# Patient Record
Sex: Female | Born: 1955 | Race: Black or African American | Hispanic: No | Marital: Single | Smoking: Former smoker
Health system: Southern US, Community
[De-identification: ages and names within clinical notes are randomized; demographics above are authoritative.]

## PROBLEM LIST (undated history)

## (undated) DIAGNOSIS — I517 Cardiomegaly: Secondary | ICD-10-CM

## (undated) DIAGNOSIS — M1990 Unspecified osteoarthritis, unspecified site: Secondary | ICD-10-CM

## (undated) DIAGNOSIS — G4730 Sleep apnea, unspecified: Secondary | ICD-10-CM

## (undated) DIAGNOSIS — I890 Lymphedema, not elsewhere classified: Secondary | ICD-10-CM

## (undated) DIAGNOSIS — M4800 Spinal stenosis, site unspecified: Secondary | ICD-10-CM

## (undated) DIAGNOSIS — I10 Essential (primary) hypertension: Secondary | ICD-10-CM

## (undated) DIAGNOSIS — J45909 Unspecified asthma, uncomplicated: Secondary | ICD-10-CM

## (undated) DIAGNOSIS — I509 Heart failure, unspecified: Secondary | ICD-10-CM

## (undated) HISTORY — DX: Heart failure, unspecified: I50.9

## (undated) HISTORY — PX: NO PAST SURGERIES: SHX2092

---

## 2005-01-17 ENCOUNTER — Emergency Department: Payer: Self-pay | Admitting: Emergency Medicine

## 2005-01-23 ENCOUNTER — Emergency Department: Payer: Self-pay | Admitting: General Practice

## 2005-03-23 ENCOUNTER — Emergency Department: Payer: Self-pay | Admitting: Emergency Medicine

## 2005-04-30 ENCOUNTER — Ambulatory Visit: Payer: Self-pay | Admitting: Family Medicine

## 2008-03-01 ENCOUNTER — Emergency Department: Payer: Self-pay | Admitting: Emergency Medicine

## 2009-02-10 DIAGNOSIS — I890 Lymphedema, not elsewhere classified: Secondary | ICD-10-CM

## 2009-02-10 DIAGNOSIS — I10 Essential (primary) hypertension: Secondary | ICD-10-CM | POA: Insufficient documentation

## 2009-05-25 ENCOUNTER — Ambulatory Visit: Payer: Self-pay

## 2009-06-29 ENCOUNTER — Ambulatory Visit: Payer: Self-pay | Admitting: Specialist

## 2009-06-29 ENCOUNTER — Other Ambulatory Visit: Payer: Self-pay | Admitting: Internal Medicine

## 2009-07-21 ENCOUNTER — Ambulatory Visit: Payer: Self-pay | Admitting: Internal Medicine

## 2009-08-05 ENCOUNTER — Encounter: Payer: Self-pay | Admitting: Internal Medicine

## 2009-08-09 ENCOUNTER — Encounter: Payer: Self-pay | Admitting: Internal Medicine

## 2009-09-06 ENCOUNTER — Encounter: Payer: Self-pay | Admitting: Internal Medicine

## 2009-10-07 ENCOUNTER — Encounter: Payer: Self-pay | Admitting: Internal Medicine

## 2009-11-06 ENCOUNTER — Encounter: Payer: Self-pay | Admitting: Internal Medicine

## 2009-11-14 ENCOUNTER — Ambulatory Visit: Payer: Self-pay

## 2009-12-07 ENCOUNTER — Encounter: Payer: Self-pay | Admitting: Internal Medicine

## 2010-01-06 ENCOUNTER — Encounter: Payer: Self-pay | Admitting: Internal Medicine

## 2010-03-29 ENCOUNTER — Emergency Department: Payer: Self-pay | Admitting: Unknown Physician Specialty

## 2011-03-30 ENCOUNTER — Ambulatory Visit: Payer: Self-pay

## 2011-04-11 ENCOUNTER — Ambulatory Visit: Payer: Self-pay | Admitting: Nurse Practitioner

## 2011-07-11 DIAGNOSIS — M4800 Spinal stenosis, site unspecified: Secondary | ICD-10-CM | POA: Insufficient documentation

## 2012-07-08 ENCOUNTER — Ambulatory Visit: Payer: Self-pay | Admitting: Nurse Practitioner

## 2013-05-18 ENCOUNTER — Emergency Department: Payer: Self-pay | Admitting: Emergency Medicine

## 2013-05-18 LAB — URINALYSIS, COMPLETE
Bacteria: NONE SEEN
Bilirubin,UR: NEGATIVE
Blood: NEGATIVE
Glucose,UR: NEGATIVE mg/dL (ref 0–75)
Ketone: NEGATIVE
Nitrite: NEGATIVE
Protein: NEGATIVE
RBC,UR: <1 /HPF (ref 0–5)
Specific Gravity: 1.011 (ref 1.003–1.030)
WBC UR: 1 /HPF (ref 0–5)

## 2013-05-18 LAB — CBC
HGB: 12.7 g/dL (ref 12.0–16.0)
Platelet: 180 10*3/uL (ref 150–440)
RBC: 5.06 10*6/uL (ref 3.80–5.20)
RDW: 15.5 % — ABNORMAL HIGH (ref 11.5–14.5)
WBC: 11.1 10*3/uL — ABNORMAL HIGH (ref 3.6–11.0)

## 2013-05-18 LAB — COMPREHENSIVE METABOLIC PANEL
BUN: 13 mg/dL (ref 7–18)
Bilirubin,Total: 0.5 mg/dL (ref 0.2–1.0)
Chloride: 102 mmol/L (ref 98–107)
Co2: 30 mmol/L (ref 21–32)
Creatinine: 1 mg/dL (ref 0.60–1.30)
EGFR (African American): >60
EGFR (Non-African Amer.): >60
Glucose: 88 mg/dL (ref 65–99)
SGOT(AST): 24 U/L (ref 15–37)

## 2013-09-28 ENCOUNTER — Inpatient Hospital Stay: Payer: Self-pay | Admitting: Internal Medicine

## 2013-09-28 LAB — CBC WITH DIFFERENTIAL/PLATELET
Basophil #: 0.1 10*3/uL (ref 0.0–0.1)
Basophil %: 0.8 %
EOS ABS: 0 10*3/uL (ref 0.0–0.7)
EOS PCT: 0.1 %
HCT: 39.8 % (ref 35.0–47.0)
HGB: 12.8 g/dL (ref 12.0–16.0)
LYMPHS PCT: 25.3 %
Lymphocyte #: 2.1 10*3/uL (ref 1.0–3.6)
MCH: 25.8 pg — ABNORMAL LOW (ref 26.0–34.0)
MCHC: 32.1 g/dL (ref 32.0–36.0)
MCV: 81 fL (ref 80–100)
Monocyte #: 0.8 x10 3/mm (ref 0.2–0.9)
Monocyte %: 9.9 %
Neutrophil #: 5.3 10*3/uL (ref 1.4–6.5)
Neutrophil %: 63.9 %
Platelet: 222 10*3/uL (ref 150–440)
RBC: 4.94 10*6/uL (ref 3.80–5.20)
RDW: 15.5 % — ABNORMAL HIGH (ref 11.5–14.5)
WBC: 8.3 10*3/uL (ref 3.6–11.0)

## 2013-09-28 LAB — BASIC METABOLIC PANEL
Anion Gap: 6 — ABNORMAL LOW (ref 7–16)
BUN: 12 mg/dL (ref 7–18)
CREATININE: 0.95 mg/dL (ref 0.60–1.30)
Calcium, Total: 8.6 mg/dL (ref 8.5–10.1)
Chloride: 104 mmol/L (ref 98–107)
Co2: 29 mmol/L (ref 21–32)
EGFR (African American): >60
Glucose: 74 mg/dL (ref 65–99)
Osmolality: 276 (ref 275–301)
Potassium: 4 mmol/L (ref 3.5–5.1)
SODIUM: 139 mmol/L (ref 136–145)

## 2013-09-29 LAB — BASIC METABOLIC PANEL
Anion Gap: 5 — ABNORMAL LOW (ref 7–16)
BUN: 13 mg/dL (ref 7–18)
CALCIUM: 8.6 mg/dL (ref 8.5–10.1)
CHLORIDE: 101 mmol/L (ref 98–107)
Co2: 31 mmol/L (ref 21–32)
Creatinine: 0.92 mg/dL (ref 0.60–1.30)
EGFR (African American): >60
EGFR (Non-African Amer.): >60
GLUCOSE: 99 mg/dL (ref 65–99)
Osmolality: 274 (ref 275–301)
Potassium: 4.1 mmol/L (ref 3.5–5.1)
SODIUM: 137 mmol/L (ref 136–145)

## 2013-09-30 LAB — BASIC METABOLIC PANEL
Anion Gap: 6 — ABNORMAL LOW (ref 7–16)
BUN: 14 mg/dL (ref 7–18)
Calcium, Total: 8.4 mg/dL — ABNORMAL LOW (ref 8.5–10.1)
Chloride: 102 mmol/L (ref 98–107)
Co2: 28 mmol/L (ref 21–32)
Creatinine: 0.95 mg/dL (ref 0.60–1.30)
EGFR (African American): >60
EGFR (Non-African Amer.): >60
GLUCOSE: 95 mg/dL (ref 65–99)
Osmolality: 272 (ref 275–301)
Potassium: 4.1 mmol/L (ref 3.5–5.1)
Sodium: 136 mmol/L (ref 136–145)

## 2013-10-05 LAB — WOUND CULTURE

## 2013-10-08 ENCOUNTER — Encounter: Payer: Self-pay | Admitting: Surgery

## 2013-11-06 ENCOUNTER — Encounter: Payer: Self-pay | Admitting: Surgery

## 2013-12-28 DIAGNOSIS — Z720 Tobacco use: Secondary | ICD-10-CM | POA: Insufficient documentation

## 2013-12-28 DIAGNOSIS — R21 Rash and other nonspecific skin eruption: Secondary | ICD-10-CM | POA: Insufficient documentation

## 2014-05-07 ENCOUNTER — Ambulatory Visit: Payer: Self-pay | Admitting: Family Medicine

## 2014-10-30 NOTE — Discharge Summary (Signed)
PATIENT NAME:  [name], [name] MR#:  [id number] DATE OF BIRTH:  [birth year]  DATE OF ADMISSION:  09/28/2013 DATE OF DISCHARGE:  10/01/2013  ADMITTING PHYSICIAN: Dr. Benjie Karvonen. DISCHARGING PHYSICIAN:  Gladstone Lighter.  PRIMARY CARE PHYSICIAN:  At [facility] clinic.   [place]: None.   DISCHARGE DIAGNOSES: 1.  Left lower extremity cellulitis.  2.  Hypertension.  3.  Chronic bilateral lymphedema.  4.  Obstructive sleep apnea.  5.  Asthma.  6.  Spinal stenosis.  7.  Chronic low back pain.   DISCHARGE HOME MEDICATIONS:  1.  Lasix 40 mg p.o. daily p.r.n. for swelling.  2.  Gabapentin 300 mg once a day as needed for pain.  3.  Atenolol 50 mg p.o. daily.  4.  Tylenol Arthritis Caplet 1 to 2 tablets 650 mg strength every 8 hours as needed for pain.  5.  Cetirizine 10 mg p.o. daily p.r.n. for allergies.  6.  Proventil inhaler 2 puffs 4 times a day as needed.  7.  Clindamycin 300 mg every 6 hours for 8 days.  8.  Tramadol 50 mg p.o. for pain.   DISCHARGE DIET: Low sodium ADA 1800 calorie diet.   DISCHARGE ACTIVITY: As tolerated.     FOLLOWUP INSTRUCTIONS: 1.  Follow up in the wound clinic in 5 days.  2.  PCP followup in 1 week.  3.  Please advise to eat more yogurt and take probiotic capsules over the counter while on the antibiotics to prevent diarrhea.   LABORATORY DATA AND IMAGING STUDIES: Prior to discharge, wound cultures were growing heavy growth of Staph aureus and gram-negative rods. WBC is 8.3, hemoglobin 12.8, hematocrit 39.8, platelet count 222.   Sodium 136, potassium 4.1, chloride 102, bicarb 28, BUN 14, creatinine 0.96, glucose 99, calcium 8.4. Ultrasound Dopplers bilateral lower extremities showing no evidence of DVT, but the patient's lymphedema is hard to evaluate.   BRIEF HOSPITAL COURSE: Ms. Heffelfinger is a pleasant 59 year old female with past medical history significant for asthma, sleep apnea, spinal stenosis, chronic low back pain and bilateral  lymphedema who presented to the hospital secondary to worsening redness, tenderness and swelling of her left lower extremity. She has multiple pustules that were coalescing to form large areas of pus,  worsened erythema and tenderness and swelling of her left leg.  1.  Cellulitis, started on vanc and Zosyn. Blood culture was not ordered. The patient has been afebrile during her hospital course. She was seen by wound care nurse, along with the antibiotics. Local treatment for her wound also helped with dressing changes. She was advised to follow up with wound care clinic after discharge as well and is being discharged on clindamycin. The patient was also advised to take probiotics to avoid C. diff diarrhea while on clindamycin.   Her swelling and redness have improved. Her ambulation has improved as well prior to discharge. Her course has been otherwise uneventful in the hospital. All her other home medications were continued without any changes.   DISCHARGE CONDITION: Stable.   DISCHARGE DISPOSITION: Home.   TIME SPENT ON DISCHARGE: 40 minutes.    ____________________________ Gladstone Lighter, MD rk:dmm D: 10/01/2013 15:08:00 ET T: 10/01/2013 17:52:58 ET JOB#: [id number]  cc: Gladstone Lighter, MD, <Dictator> [facility] Gladstone Lighter MD ELECTRONICALLY SIGNED 10/15/2013 13:50

## 2014-10-30 NOTE — H&P (Signed)
PATIENT NAME:  [name], RAYLE MR#:  [id number] DATE OF BIRTH:  20-Oct-1955  DATE OF ADMISSION:  09/28/2013  PRIMARY CARE PHYSICIAN: At [facility].  CHIEF COMPLAINT: Left leg pain and lymphedema.  HISTORY OF PRESENT ILLNESS: The patient is a very pleasant 59 year old female with morbid obesity, hypertension, OSA, asthma, spinal stenosis, who presents with the above complaint. Over the past several weeks, the patient had increasing lower extremity edema and pain. She saw her PCP about 10 days ago. The plan was for the patient to have wound consultation; however, she has been unable to see a wound care clinician. She comes in today with increasing left lower extremity pain to the point she is unable to walk. It also is very warm and tender. In the ER, she was started on clindamycin. Dopplers of the lower extremity were performed with limited visibility but no obvious deep vein thrombosis.   REVIEW OF SYSTEMS:  CONSTITUTIONAL: No fever. Positive fatigue and weakness. EYES: No blurry or double vision. No glaucoma or cataracts. ENT: No tinnitus, ear pain, hearing loss. Positive snoring. He says he does have.  RESPIRATORY: No cough, wheezing, hemoptysis, dyspnea. Positive asthma.  CARDIOVASCULAR: No chest pressure, orthopnea, edema, arrhythmia, dyspnea on exertion. GASTROINTESTINAL: No nausea or vomiting, diarrhea, abdominal pain, melena or ulcers.  GENITOURINARY: No dysuria or hematuria. ENDOCRINE: No polyuria or polydipsia.  HEME/LYMPH: Positive anemia.  SKIN: Positive lymphedema/cellulitis of her left lower extremity.  MUSCULOSKELETAL: Positive limited activity due to body habitus. Positive arthritis. NEUROLOGIC: No history of CVA, TIA or seizures.  PSYCHIATRIC: No history of anxiety or depression.  PAST MEDICAL HISTORY:  1. Pre-diabetes.  2. Hypertension.  3. Lymphedema.  4. Sleep apnea on CPAP machine.  5. Asthma.  6. Chronic back pain.  7. Spinal stenosis.   MEDICATIONS: 1. Tylenol  arthritis 1 to 2 tablets q.8 hours p.r.n.  2. Lasix 40 mg as needed daily.  3. Gabapentin 300 mg at bedtime p.r.n.  4. Atenolol 50 mg daily.   ALLERGIES:  IBUPROFEN CAUSES HIVES.   SOCIAL HISTORY: No tobacco, alcohol or drug use.    FAMILY HISTORY: Positive for dementia and hypothyroidism.   PHYSICAL EXAMINATION: VITAL SIGNS: Temperature 98.7, pulse 67, respirations 18, blood pressure 187/87, 96% on room air.  GENERAL: The patient is alert, in mild distress from her left lower extremity. HEENT: Head is atraumatic. Pupils are round and reactive. Sclerae anicteric. Mucous membranes are moist. Oropharynx: Clear.  NECK: Supple. Hard to appreciate any organomegaly or JVD or enlarged thyroid due to her short neck.  LUNGS: Clear to auscultation without crackles, rales, rhonchi or wheezing. Normal percussion. Normal chest expansion. ABDOMEN: Bowel sounds are positive. Nontender, nondistended. Hard to appreciate organomegaly due to body habitus.  EXTREMITIES: She has 4+ pitting edema bilaterally, and her left lower extremity is red, tender to touch, warm, very cellulitic in nature. Her left leg is also weeping. Both lower extremities have skin changes consistent with lymphedema and chronic venous stasis.  NEUROLOGIC: Cranial nerves II through XII are grossly intact. There are no focal deficits.  SKIN: Please see extremity exam findings.  LABORATORIES: White blood cells 8.3, hemoglobin 12.8, hematocrit 39.8, platelets 222,000.  Sodium 139, potassium 4, chloride 104, bicarbonate 29, BUN 12, creatinine 0.95. Glucose 74. Calcium 8.6. Dopplers of the lower extremity as per the tech is no evidence of DVT; however, it is a limited. EKG: Normal sinus rhythm. No ST elevation or depression.  ASSESSMENT AND PLAN: This is a very pleasant 59 year old morbidly obese  woman with obstructive sleep apnea, hypertension, asthma, spinal stenosis, who presents with increasing pain in the left lower extremity. Findings  consistent with cellulitis on top of chronic lymphedema.  1. Left lower extremity cellulitis with a negative Doppler exam. The patient is on clindamycin, which was started in the ER, which will continue. Her main issues is this lymphedema. Her leg is oozing. She has a secondary infection due to her lymphedema. Wound consultation is necessary.  2. Lymphedema. As mentioned, we will consult wound care, also case management. he patient will need home health wound care at discharge.  3. Obstructive sleep apnea. Will continue CPAP machine.  4. Accelerated hypertension due to pain. Will continue atenolol at this time and I think her blood pressure was elevated due to the pain, so we will need to control her pain. We write for p.r.n. hydralazine order as well.  5. Asthma. This seems to be well compensated. 6. The patient is FULL CODE STATUS.    ____________________________ Feliciano Wynter P. Benjie Karvonen, MD spm:lm D: 09/28/2013 19:31:32 ET T: 09/28/2013 20:24:38 ET JOB#: [id number]  cc: Vibha Ferdig P. Benjie Karvonen, MD, <Dictator> Donell Beers Lakysha Kossman MD ELECTRONICALLY SIGNED 09/30/2013 21:33

## 2015-01-21 ENCOUNTER — Encounter: Payer: Medicaid Other | Attending: Surgery | Admitting: Surgery

## 2015-01-21 DIAGNOSIS — J45909 Unspecified asthma, uncomplicated: Secondary | ICD-10-CM | POA: Insufficient documentation

## 2015-01-21 DIAGNOSIS — I10 Essential (primary) hypertension: Secondary | ICD-10-CM | POA: Insufficient documentation

## 2015-01-21 DIAGNOSIS — I890 Lymphedema, not elsewhere classified: Secondary | ICD-10-CM | POA: Insufficient documentation

## 2015-01-21 DIAGNOSIS — L97919 Non-pressure chronic ulcer of unspecified part of right lower leg with unspecified severity: Secondary | ICD-10-CM | POA: Diagnosis not present

## 2015-01-21 DIAGNOSIS — E11622 Type 2 diabetes mellitus with other skin ulcer: Secondary | ICD-10-CM | POA: Insufficient documentation

## 2015-01-21 DIAGNOSIS — L03115 Cellulitis of right lower limb: Secondary | ICD-10-CM | POA: Diagnosis not present

## 2015-01-21 DIAGNOSIS — Z87891 Personal history of nicotine dependence: Secondary | ICD-10-CM | POA: Diagnosis not present

## 2015-01-21 DIAGNOSIS — G4730 Sleep apnea, unspecified: Secondary | ICD-10-CM | POA: Insufficient documentation

## 2015-01-21 NOTE — Progress Notes (Addendum)
LURETTA, [name] ([id number]) Visit Report for 01/21/2015 Chief Complaint Document Details Patient Name: [name], [name]. Date of Service: 01/21/2015 2:30 PM Medical Record Number: [id number] Patient Account Number: [id number] Date of Birth/Sex: 22-Oct-1955 (59 y.o. Female) Treating RN: Primary Care Physician: Orlene Erm Other Clinician: Referring Physician: Christoper Fabian Treating Physician/Extender: Frann Rider in Treatment: 0 Information Obtained from: Patient Chief Complaint Patient presents to the wound care center for a consult due non healing wound. 59 year old patient was known to the wound clinic from previous visits returns with massive lymphedema of the right lower extremity associated with redness and weeping for about 2 weeks Electronic Signature(s) Signed: 01/21/2015 3:38:38 PM By: Christin Fudge MD, FACS Entered By: Christin Fudge on 01/21/2015 15:38:37 Kindler, [name] ([id number]) -------------------------------------------------------------------------------- HPI Details Patient Name: [name]. Date of Service: 01/21/2015 2:30 PM Medical Record Number: [id number] Patient Account Number: [id number] Date of Birth/Sex: 1956/03/26 (59 y.o. Female) Treating RN: Primary Care Physician: Orlene Erm Other Clinician: Referring Physician: Christoper Fabian Treating Physician/Extender: Frann Rider in Treatment: 0 History of Present Illness Location: right lower extremity Quality: Patient reports experiencing a dull pain to affected area(s). Severity: Patient states wound are getting worse. Duration: Patient has had the wound for < 2 weeks prior to presenting for treatment Timing: Pain in wound is Intermittent (comes and goes Context: The wound appeared gradually over time Modifying Factors: Consults to this date include:seen by her PCP and recently put on clindamycin Associated Signs and Symptoms: Patient reports having difficulty standing  for long periods. HPI Description: The patient is a 59 year old female with history of hypertension and a long-standing history of bilateral lower extremity lymphedema (first presented on 4/2) . She has had open ulcers in the past which have always responded to compression therapy. She had briefly been to a lymphedema clinic in the past which helped her at the time. this time around she stopped treatment of her lymphedema pumps approximately 2 weeks ago because of some pain in the knees and then noticed the right leg getting worse. She was seen by her PCP who put her on clindamycin 4 times a day 2 days ago. The patient has seen [facility] and Dr. Delana Meyer had seen her last year where a vascular study including venous and arterial duplex studies were within normal limits. he had recommended compression stockings and lymphedema pumps and the patient has been using this in about 2 weeks ago. She is known to be diabetic but in the past few time she's gone to her primary care doctor her hemoglobin A1c has been normal. Electronic Signature(s) Signed: 01/21/2015 3:41:58 PM By: Christin Fudge MD, FACS Entered By: Christin Fudge on 01/21/2015 15:41:58 Puzzo, [name] ([id number]) -------------------------------------------------------------------------------- Physical Exam Details Patient Name: Elizabeth Landau C. Date of Service: 01/21/2015 2:30 PM Medical Record Number: [id number] Patient Account Number: [id number] Date of Birth/Sex: 01-Jun-1956 (58 y.o. Female) Treating RN: Primary Care Physician: Orlene Erm Other Clinician: Referring Physician: Christoper Fabian Treating Physician/Extender: Frann Rider in Treatment: 0 Constitutional . Pulse regular. Respirations normal and unlabored. Afebrile. . Eyes Nonicteric. Reactive to light. Ears, Nose, Mouth, and Throat Lips, teeth, and gums WNL.Marland Kitchen Moist mucosa without lesions . Neck supple and nontender. No palpable supraclavicular or cervical  adenopathy. Normal sized without goiter. Respiratory WNL. No retractions.. Breath sounds WNL, No rubs, rales, rhonchi, or wheeze.. Cardiovascular Pedal Pulses WNL. ABI cannot be measured. as massive stage III lymphedema of the right lower extremity with weeping and skin changes as expected.. Gastrointestinal (GI) Abdomen  without masses or tenderness.. No liver or spleen enlargement or tenderness.. Musculoskeletal Adexa without tenderness or enlargement.. Digits and nails w/o clubbing, cyanosis, infection, petechiae, ischemia, or inflammatory conditions.. Integumentary (Hair, Skin) No suspicious lesions. No crepitus or fluctuance. No peri-wound warmth or erythema. No masses.Marland Kitchen Psychiatric Judgement and insight Intact.. No evidence of depression, anxiety, or agitation.. Electronic Signature(s) Signed: 01/21/2015 3:42:54 PM By: Christin Fudge MD, FACS Entered By: Christin Fudge on 01/21/2015 15:42:53 [name] ([id number]) -------------------------------------------------------------------------------- Physician Orders Details Patient Name: [name]. Date of Service: 01/21/2015 2:30 PM Medical Record Number: [id number] Patient Account Number: [id number] Date of Birth/Sex: [birth year] (59 y.o. Female) Treating RN: Cornell Barman Primary Care Physician: Orlene Erm Other Clinician: Referring Physician: Christoper Fabian Treating Physician/Extender: Frann Rider in Treatment: 0 Verbal / Phone Orders: Yes Clinician: Cornell Barman Read Back and Verified: Yes Diagnosis Coding Wound Cleansing Wound #2 Right,Circumferential Lower Leg o Clean wound with Normal Saline. Anesthetic Wound #2 Right,Circumferential Lower Leg o Topical Lidocaine 4% cream applied to wound bed prior to debridement Skin Barriers/Peri-Wound Care Wound #2 Right,Circumferential Lower Leg o Barrier cream Primary Wound Dressing Wound #2 Right,Circumferential Lower Leg o Aquacel Ag Secondary  Dressing Wound #2 Right,Circumferential Lower Leg o Drawtex o ABD pad Dressing Change Frequency Wound #2 Right,Circumferential Lower Leg o Change dressing every week Follow-up Appointments Wound #2 Right,Circumferential Lower Leg o Return Appointment in 1 week. Edema Control Wound #2 Right,Circumferential Lower Leg o 4-Layer Compression System - Right Lower Extremity o Elevate legs to the level of the heart and pump ankles as often as possible o Compression Pump: Use compression pump on right lower extremity for 30 minutes, twice daily. FLORENCE, YEUNG ([id number]) Electronic Signature(s) Signed: 01/21/2015 4:32:00 PM By: Christin Fudge MD, FACS Signed: 01/21/2015 5:11:11 PM By: Gretta Cool RN, BSN, Kim RN, BSN Entered By: Gretta Cool, RN, BSN, Kim on 01/21/2015 15:25:23 [name] ([id number]) -------------------------------------------------------------------------------- Problem List Details Patient Name: MIKENNA, BUNKLEY. Date of Service: 01/21/2015 2:30 PM Medical Record Number: [id number] Patient Account Number: [id number] Date of Birth/Sex: 06-10-1956 (59 y.o. Female) Treating RN: Primary Care Physician: Orlene Erm Other Clinician: Referring Physician: Christoper Fabian Treating Physician/Extender: Frann Rider in Treatment: 0 Active Problems ICD-10 Encounter Code Description Active Date Diagnosis E11.622 Type 2 diabetes mellitus with other skin ulcer 01/21/2015 Yes I89.0 Lymphedema, not elsewhere classified 01/21/2015 Yes E66.01 Morbid (severe) obesity due to excess calories 01/21/2015 Yes L03.115 Cellulitis of right lower limb 01/21/2015 Yes Inactive Problems Resolved Problems Electronic Signature(s) Signed: 01/21/2015 3:37:44 PM By: Christin Fudge MD, FACS Previous Signature: 01/21/2015 3:37:26 PM Version By: Christin Fudge MD, FACS Entered By: Christin Fudge on 01/21/2015 15:37:44 Raymond, [name]  ([id number]) -------------------------------------------------------------------------------- Progress Note Details Patient Name: [name]. Date of Service: 01/21/2015 2:30 PM Medical Record Number: [id number] Patient Account Number: [id number] Date of Birth/Sex: [birth year] (59 y.o. Female) Treating RN: Primary Care Physician: Orlene Erm Other Clinician: Referring Physician: Christoper Fabian Treating Physician/Extender: Frann Rider in Treatment: 0 Subjective Chief Complaint Information obtained from Patient Patient presents to the wound care center for a consult due non healing wound. 58 year old patient was known to the wound clinic from previous visits returns with massive lymphedema of the right lower extremity associated with redness and weeping for about 2 weeks History of Present Illness (HPI) The following HPI elements were documented for the patient's wound: Location: right lower extremity Quality: Patient reports experiencing a dull pain to affected area(s). Severity: Patient states wound are getting worse. Duration: Patient has had  the wound for < 2 weeks prior to presenting for treatment Timing: Pain in wound is Intermittent (comes and goes Context: The wound appeared gradually over time Modifying Factors: Consults to this date include:seen by her PCP and recently put on clindamycin Associated Signs and Symptoms: Patient reports having difficulty standing for long periods. The patient is a 59 year old female with history of hypertension and a long-standing history of bilateral lower extremity lymphedema (first presented on 4/2) . She has had open ulcers in the past which have always responded to compression therapy. She had briefly been to a lymphedema clinic in the past which helped her at the time. this time around she stopped treatment of her lymphedema pumps approximately 2 weeks ago because of some pain in the knees and then noticed the right leg  getting worse. She was seen by her PCP who put her on clindamycin 4 times a day 2 days ago. The patient has seen [facility] and Dr. Delana Meyer had seen her last year where a vascular study including venous and arterial duplex studies were within normal limits. he had recommended compression stockings and lymphedema pumps and the patient has been using this in about 2 weeks ago. She is known to be diabetic but in the past few time she's gone to her primary care doctor her hemoglobin A1c has been normal. Wound History Patient presents with 1 open wound that has been present for approximately 2 weeks. Patient has been treating wound in the following manner: washing and alcohol. Laboratory tests have not been performed in the last month. Patient reportedly has not tested positive for an antibiotic resistant organism. Patient reportedly has not tested positive for osteomyelitis. Patient reportedly has had testing performed to evaluate circulation in the legs. Patient experiences the following problems associated with their wounds: infection. [name], [name] ([id number]) Patient History Information obtained from Patient, Chart. Allergies ibuprofen (Reaction: hives, swells), ACE Inhibitors Family History Cancer - Father, Diabetes - Father, Heart Disease - Mother, Hypertension - Mother, Father, Seizures - Mother, Stroke - Mother, Thyroid Problems - Mother, Siblings, No family history of Hereditary Spherocytosis, Kidney Disease, Lung Disease, Tuberculosis. Social History Former smoker - uses snuff, Marital Status - Single, Alcohol Use - Rarely, Drug Use - No History, Caffeine Use - Daily. 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 Eyes Complains or has symptoms of Glasses / Contacts. Denies complaints or symptoms of Dry Eyes, Vision Changes. Ear/Nose/Mouth/Throat The patient has no complaints or symptoms. Hematologic/Lymphatic The patient has no complaints or symptoms. Respiratory The patient has no complaints or symptoms. Cardiovascular Complains or has symptoms of LE edema. Gastrointestinal The patient has no complaints or symptoms. [name], [name] ([id number]) Endocrine The patient has no complaints or symptoms. Genitourinary Complains or has symptoms of Incontinence/dribbling. Immunological The patient has no complaints or symptoms. Integumentary (Skin) Complains or has symptoms of Breakdown - lymphedema, Swelling. Denies complaints or symptoms of Wounds, Bleeding or bruising tendency. Musculoskeletal The patient has no complaints or symptoms. Neurologic The patient has no complaints or  symptoms. Oncologic The patient has no complaints or symptoms. Psychiatric The patient has no complaints or symptoms. Objective Constitutional Pulse regular. Respirations normal and unlabored. Afebrile. Vitals Time Taken: 2:42 PM, Height: 63 in, Source: Stated, Weight: 396 lbs, BMI: 70.1, Temperature: 98.5  F, Pulse: 76 bpm, Respiratory Rate: 20 breaths/min, Blood Pressure: 127/65 mmHg. Eyes Nonicteric. Reactive to light. Ears, Nose, Mouth, and Throat Lips, teeth, and gums WNL.Marland Kitchen Moist mucosa without lesions . Neck supple and nontender. No palpable supraclavicular or cervical adenopathy. Normal sized without goiter. Respiratory WNL. No  retractions.. Breath sounds WNL, No rubs, rales, rhonchi, or wheeze.. Cardiovascular Pedal Pulses WNL. ABI cannot be measured. as massive stage III lymphedema of the right lower extremity with weeping and skin changes as expected.. Gastrointestinal (GI) Abdomen without masses or tenderness.. No liver or spleen enlargement or tenderness.[name], [name] ([id number]) Musculoskeletal Adexa without tenderness or enlargement.. Digits and nails w/o clubbing, cyanosis, infection, petechiae, ischemia, or inflammatory conditions.Marland Kitchen Psychiatric Judgement and insight Intact.. No evidence of depression, anxiety, or agitation.. Integumentary (Hair, Skin) No suspicious lesions. No crepitus or fluctuance. No peri-wound warmth or erythema. No masses.. Other Condition(s) Patient presents with Lymphedema located on the Right Leg. The skin appearance exhibited: Maceration, Moist, Mottled. The skin appearance did not exhibit: Atrophie Blanche, Callus, Crepitus, Cyanosis, Dry/Scaly, Ecchymosis, Erythema, Excoriation, Fluctuance, Friable, Hemosiderin Staining, Induration, Localized Edema, Pallor, Rash, Rubor, Scarring. Skin temperature was noted as No Abnormality. General Notes: Stage III Lymphedema; excessive drainage Assessment Active Problems ICD-10 E11.622 - Type 2 diabetes mellitus with other skin ulcer I89.0 - Lymphedema, not elsewhere classified E66.01 - Morbid (severe) obesity due to excess calories L03.115 - Cellulitis of right lower limb This patient was known to have massive lymphedema stage III with an element of cellulitis has been put on oral medications which is clindamycin. I have urged her to take her medications as prescribed by her PCP but cautioned her that if she has any signs of septicemia, feeling unwell or fever she should report to the ER immediately for admission and further investigation and IV antibiotics. I have recommended silver alginate, drawtex, and a 4-layer compression  which she has tolerated in the past.she knows to use her lymphedema pumps regularly and will also use elevation of her limbs. She will return to see as again next week. Plan SURAH, PELLEY ([id number]) Wound Cleansing: Wound #2 Right,Circumferential Lower Leg: Clean wound with Normal Saline. Anesthetic: Wound #2 Right,Circumferential Lower Leg: Topical Lidocaine 4% cream applied to wound bed prior to debridement Skin Barriers/Peri-Wound Care: Wound #2 Right,Circumferential Lower Leg: Barrier cream Primary Wound Dressing: Wound #2 Right,Circumferential Lower Leg: Aquacel Ag Secondary Dressing: Wound #2 Right,Circumferential Lower Leg: Drawtex ABD pad Dressing Change Frequency: Wound #2 Right,Circumferential Lower Leg: Change dressing every week Follow-up Appointments: Wound #2 Right,Circumferential Lower Leg: Return Appointment in 1 week. Edema Control: Wound #2 Right,Circumferential Lower Leg: 4-Layer Compression System - Right Lower Extremity Elevate legs to the level of the heart and pump ankles as often as possible Compression Pump: Use compression pump on right lower extremity for 30 minutes, twice daily. This patient was known to have massive lymphedema stage III with an element of cellulitis has been put on oral medications which is clindamycin. I have urged her to take her medications as prescribed by her PCP but cautioned her that if she has any signs of septicemia, feeling unwell or fever she should report to the ER immediately for admission and further investigation and IV antibiotics. I have recommended silver alginate,  drawtex, and a 4-layer compression which she has tolerated in the past.she knows to use her lymphedema pumps regularly and will also use elevation of her limbs. She will return to see as again next week. Electronic Signature(s) Signed: 01/27/2015 12:00:42 PM By: Christin Fudge MD, FACS Previous Signature: 01/25/2015 4:07:12 PM Version By: Christin Fudge MD, FACS Previous Signature: 01/21/2015 3:46:51 PM Version By: Christin Fudge MD, FACS Entered By: Christin Fudge on 01/27/2015 12:00:42 [name], [name] ([id number]) [name], [name] ([id number]) -------------------------------------------------------------------------------- ROS/PFSH Details Patient Name: [name], [name]. Date of Service: 01/21/2015 2:30 PM Medical Record Number: [id number] Patient Account Number: [id number] Date of Birth/Sex: 1956/06/16 (59 y.o. Female) Treating RN: Cornell Barman Primary Care Physician: Orlene Erm Other Clinician: Referring Physician: Christoper Fabian Treating Physician/Extender: Frann Rider in Treatment: 0 Information Obtained From Patient Chart Wound History Do you currently have one or more open woundso Yes How many open wounds do you currently haveo 1 Approximately how long have you had your woundso 2 weeks How have you been treating your wound(s) until nowo washing and alcohol Has your wound(s) ever healed and then re-openedo No Have you had any lab work done in the past montho No Have you tested positive for an antibiotic resistant organism (MRSA, VRE)o No Have you tested positive for osteomyelitis (bone infection)o No Have you had any tests for circulation on your legso Yes Who ordered the test[facility] Where was the test doneo [facility] Have you had other problems associated with your woundso Infection Eyes Complaints and Symptoms: Positive for: Glasses / Contacts Negative for: Dry Eyes; Vision Changes Medical History: Positive for: Cataracts Negative for: Glaucoma; Optic Neuritis Cardiovascular Complaints and Symptoms: Positive for: LE edema Medical History: Positive for: Hypertension Negative for: Angina; Arrhythmia; Congestive Heart Failure; Coronary Artery Disease; Deep Vein Thrombosis; Hypotension; Myocardial Infarction; Peripheral Arterial Disease; Peripheral Venous Disease; Phlebitis;  Vasculitis Genitourinary Complaints and Symptoms: Positive for: Incontinence/dribbling [name], [name]. ([id number]) Medical History: Negative for: End Stage Renal Disease Integumentary (Skin) Complaints and Symptoms: Positive for: Breakdown - lymphedema; Swelling Negative for: Wounds; Bleeding or bruising tendency Medical History: Negative for: History of Burn; History of pressure wounds Constitutional Symptoms (General Health) Complaints and Symptoms: No Complaints or Symptoms Ear/Nose/Mouth/Throat Complaints and Symptoms: No Complaints or Symptoms Medical History: Negative for: Chronic sinus problems/congestion; Middle ear problems Hematologic/Lymphatic Complaints and Symptoms: No Complaints or Symptoms Medical History: Negative for: Anemia; Hemophilia; Human Immunodeficiency Virus; Lymphedema; Sickle Cell Disease Respiratory Complaints and Symptoms: No Complaints or Symptoms Medical History: Positive for: Asthma - controlled; Sleep Apnea - C-pap; 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 ([id number]) Complaints and Symptoms: No Complaints or Symptoms Medical History: Negative for: Type I Diabetes; Type II Diabetes Immunological Complaints and Symptoms: No Complaints or Symptoms Medical History: Negative for: Lupus Erythematosus; Raynaudos; Scleroderma Musculoskeletal Complaints and Symptoms: No Complaints or Symptoms Medical History: Positive for: Gout; Osteoarthritis Negative for: Rheumatoid Arthritis; Osteomyelitis Neurologic Complaints and Symptoms: No Complaints or Symptoms Medical History: Negative for: Dementia; Neuropathy; Quadriplegia; Paraplegia; Seizure  Disorder Oncologic Complaints and Symptoms: No Complaints or Symptoms Medical History: Negative for: Received Chemotherapy; Received Radiation Psychiatric Complaints and Symptoms: No Complaints or Symptoms Medical History: Negative for: Anorexia/bulimia; Confinement Anxiety HBO Extended History Items Eyes: Cataracts [name], [name]. ([id number]) Family and Social History Cancer: Yes -  Father; Diabetes: Yes - Father; Heart Disease: Yes - Mother; Hereditary Spherocytosis: No; Hypertension: Yes - Mother, Father; Kidney Disease: No; Lung Disease: No; Seizures: Yes - Mother; Stroke: Yes - Mother; Thyroid Problems: Yes - Mother, Siblings; Tuberculosis: No; Former smoker - uses snuff; Marital Status - Single; Alcohol Use: Rarely; Drug Use: No History; Caffeine Use: Daily; Living Will: No; Medical Power of Attorney: No Physician Affirmation I have reviewed and agree with the above information. Electronic Signature(s) Signed: 01/21/2015 5:59:39 PM By: Gretta Cool RN, BSN, Kim RN, BSN Signed: 01/24/2015 12:32:38 PM By: Christin Fudge MD, FACS Previous Signature: 01/21/2015 3:36:25 PM Version By: Christin Fudge MD, FACS Previous Signature: 01/21/2015 3:02:07 PM Version By: Christin Fudge MD, FACS Previous Signature: 01/21/2015 2:55:39 PM Version By: Christin Fudge MD, FACS Entered By: Gretta Cool RN, BSN, Kim on 01/21/2015 [place], [name] ([id number]) -------------------------------------------------------------------------------- SuperBill Details Patient Name: TASHINA, CREDIT. Date of Service: 01/21/2015 Medical Record Number: [id number] Patient Account Number: [id number] Date of Birth/Sex: [birth year] (59 y.o. Female) Treating RN: Primary Care Physician: Orlene Erm Other Clinician: Referring Physician: Christoper Fabian Treating Physician/Extender: Frann Rider in Treatment: 0 Diagnosis Coding ICD-10 Codes Code Description E11.622 Type 2 diabetes mellitus with other skin  ulcer I89.0 Lymphedema, not elsewhere classified E66.01 Morbid (severe) obesity due to excess calories L03.115 Cellulitis of right lower limb Facility Procedures CPT4: Description Modifier Quantity Code [id number] - WOUND CARE VISIT-LEV 2 EST PT 1 CPT4: [id number] (Facility Use Only) [phone] - APPLY [place] RT 1 LEG Physician Procedures CPT4 Code: [id number] Description: 20100 - WC PHYS LEVEL 4 - EST PT ICD-10 Description Diagnosis E11.622 Type 2 diabetes mellitus with other skin ulc I89.0 Lymphedema, not elsewhere classified L03.115 Cellulitis of right lower limb E66.01 Morbid (severe) obesity due to excess  calori Modifier: er es Quantity: 1 Engineer, maintenance) Signed: 01/21/2015 5:59:39 PM By: Gretta Cool, RN, BSN, Kim RN, BSN Signed: 01/24/2015 12:32:38 PM By: Christin Fudge MD, FACS Previous Signature: 01/21/2015 3:47:11 PM Version By: Christin Fudge MD, FACS Entered By: Gretta Cool, RN, BSN, Kim on 01/21/2015 17:34:38

## 2015-01-22 NOTE — Progress Notes (Signed)
ROSEANNA, KOPLIN ([id number]) Visit Report for 01/21/2015 Abuse/Suicide Risk Screen Details Patient Name: [name], [name]. Date of Service: 01/21/2015 2:30 PM Medical Record Number: [id number] Patient Account Number: [id number] Date of Birth/Sex: [birth year] (59 y.o. Female) Treating RN: Cornell Barman Primary Care Physician: Orlene Erm Other Clinician: Referring Physician: Christoper Fabian Treating Physician/Extender: Frann Rider in Treatment: 0 Abuse/Suicide Risk Screen Items Answer ABUSE/SUICIDE RISK SCREEN: Has anyone close to you tried to hurt or harm you recentlyo No Do you feel uncomfortable with anyone in your familyo No Has anyone forced you do things that you didnot want to doo No Do you have any thoughts of harming yourselfo No Patient displays signs or symptoms of abuse and/or neglect. No Electronic Signature(s) Signed: 01/21/2015 5:11:11 PM By: Gretta Cool, RN, BSN, Kim RN, BSN Entered By: Gretta Cool, RN, BSN, Kim on 01/21/2015 14:59:03 Large, [name] ([id number]) -------------------------------------------------------------------------------- Activities of Daily Living Details Patient Name: [name], [name]. Date of Service: 01/21/2015 2:30 PM Medical Record Number: [id number] Patient Account Number: [id number] Date of Birth/Sex: 19-Sep-1955 (59 y.o. Female) Treating RN: Cornell Barman Primary Care Physician: Orlene Erm Other Clinician: Referring Physician: Christoper Fabian Treating Physician/Extender: Frann Rider in Treatment: 0 Activities of Daily Living Items Answer Activities of Daily Living (Please select one for each item) Drive Automobile Need Assistance Take Medications Completely Able Use Telephone Completely Able Care for Appearance Completely Able Use Toilet Completely Able Bath / Shower Completely Able Dress Self Completely Able Feed Self Completely Able Walk Completely Able Get In / Out Bed Completely Able Housework Completely  Able Prepare Meals Completely Able Handle Money Completely Able Shop for Self Completely Able Electronic Signature(s) Signed: 01/21/2015 5:11:11 PM By: Gretta Cool, RN, BSN, Kim RN, BSN Entered By: Gretta Cool, RN, BSN, Kim on 01/21/2015 14:59:23 Dauber, [name] ([id number]) -------------------------------------------------------------------------------- Education Assessment Details Patient Name: Elizabeth Reichert. Date of Service: 01/21/2015 2:30 PM Medical Record Number: [id number] Patient Account Number: [id number] Date of Birth/Sex: 20-Dec-1955 (59 y.o. Female) Treating RN: Cornell Barman Primary Care Physician: Orlene Erm Other Clinician: Referring Physician: Christoper Fabian Treating Physician/Extender: Frann Rider in Treatment: 0 Primary Learner Assessed: Patient Learning Preferences/Education Level/Primary Language Learning Preference: Explanation, Demonstration, Printed Material Highest Education Level: College or Above Preferred Language: English Cognitive Barrier Assessment/Beliefs Language Barrier: No Translator Needed: No Memory Deficit: No Emotional Barrier: No Cultural/Religious Beliefs Affecting Medical No Care: Physical Barrier Assessment Impaired Vision: Yes Glasses Impaired Hearing: No Decreased Hand dexterity: No Knowledge/Comprehension Assessment Knowledge Level: High Comprehension Level: High Ability to understand written High instructions: Ability to understand verbal High instructions: Motivation Assessment Anxiety Level: Calm Cooperation: Cooperative Education Importance: Acknowledges Need Interest in Health Problems: Asks Questions Perception: Coherent Willingness to Engage in Self- High Management Activities: Readiness to Engage in Self- High Management Activities: Electronic Signature(s) [name], [name] ([id number]) Signed: 01/21/2015 5:11:11 PM By: Gretta Cool, RN, BSN, Kim RN, BSN Entered By: Gretta Cool, RN, BSN, Kim on 01/21/2015  15:00:27 [name], [name] ([id number]) -------------------------------------------------------------------------------- Fall Risk Assessment Details Patient Name: Elizabeth Reichert. Date of Service: 01/21/2015 2:30 PM Medical Record Number: [id number] Patient Account Number: [id number] Date of Birth/Sex: 05-29-1956 (59 y.o. Female) Treating RN: Cornell Barman Primary Care Physician: Orlene Erm Other Clinician: Referring Physician: Christoper Fabian Treating Physician/Extender: Frann Rider in Treatment: 0 Fall Risk Assessment Items FALL RISK ASSESSMENT: History of falling - immediate or within 3 months 0 No Secondary diagnosis 0 No Ambulatory aid None/bed rest/wheelchair/nurse 0 No Crutches/cane/walker 15 Yes Furniture 0 No IV Access/Saline Lock 0 No Gait/Training Normal/bed  rest/immobile 0 No Weak 0 No Impaired 20 Yes Mental Status Oriented to own ability 0 No Electronic Signature(s) Signed: 01/21/2015 5:11:11 PM By: Gretta Cool, RN, BSN, Kim RN, BSN Entered By: Gretta Cool, RN, BSN, Kim on 01/21/2015 15:00:45 Wiater, [name] ([id number]) -------------------------------------------------------------------------------- Foot Assessment Details Patient Name: [name], [name] Date of Service: 01/21/2015 2:30 PM Medical Record Number: [id number] Patient Account Number: [id number] Date of Birth/Sex: [birth year] (59 y.o. Female) Treating RN: Cornell Barman Primary Care Physician: Orlene Erm Other Clinician: Referring Physician: Christoper Fabian Treating Physician/Extender: Frann Rider in Treatment: 0 Foot Assessment Items Site Locations + = Sensation present, - = Sensation absent, C = Callus, U = Ulcer R = Redness, W = Warmth, M = Maceration, PU = Pre-ulcerative lesion F = Fissure, S = Swelling, D = Dryness Assessment Right: Left: Other Deformity: No No Prior Foot Ulcer: No No Prior Amputation: No No Charcot Joint: No No Ambulatory Status: Gait: Electronic  Signature(s) Signed: 01/21/2015 5:11:11 PM By: Gretta Cool, RN, BSN, Kim RN, BSN Entered By: Gretta Cool, RN, BSN, Kim on 01/21/2015 15:01:29 Simoneaux, [name] ([id number]) -------------------------------------------------------------------------------- Nutrition Risk Assessment Details Patient Name: Elizabeth Reichert. Date of Service: 01/21/2015 2:30 PM Medical Record Number: [id number] Patient Account Number: [id number] Date of Birth/Sex: Jun 01, 1956 (59 y.o. Female) Treating RN: Cornell Barman Primary Care Physician: Orlene Erm Other Clinician: Referring Physician: Christoper Fabian Treating Physician/Extender: Frann Rider in Treatment: 0 Height (in): 63 Weight (lbs): 396 Body Mass Index (BMI): 70.1 Nutrition Risk Assessment Items NUTRITION RISK SCREEN: I have an illness or condition that made me change the kind and/or 0 No amount of food I eat I eat fewer than two meals per day 0 No I eat few fruits and vegetables, or milk products 0 No I have three or more drinks of beer, liquor or wine almost every day 0 No I have tooth or mouth problems that make it hard for me to eat 0 No I don't always have enough money to buy the food I need 0 No I eat alone most of the time 0 No I take three or more different prescribed or over-the-counter drugs a 0 No day Without wanting to, I have lost or gained 10 pounds in the last six 0 No months I am not always physically able to shop, cook and/or feed myself 0 No Nutrition Protocols Good Risk Protocol Provide education on Moderate Risk Protocol 0 nutrition Electronic Signature(s) Signed: 01/21/2015 5:11:11 PM By: Gretta Cool, RN, BSN, Kim RN, BSN Entered By: Gretta Cool, RN, BSN, Kim on 01/21/2015 15:01:20

## 2015-01-22 NOTE — Progress Notes (Signed)
JENIFER, STRUVE ([id number]) Visit Report for 01/21/2015 Allergy List Details Patient Name: [name], [name]. Date of Service: 01/21/2015 2:30 PM Medical Record Number: [id number] Patient Account Number: [id number] Date of Birth/Sex: Aug 27, 1955 (59 y.o. Female) Treating RN: Cornell Barman Primary Care Physician: Orlene Erm Other Clinician: Referring Physician: Christoper Fabian Treating Physician/Extender: Frann Rider in Treatment: 0 Allergies Active Allergies ibuprofen Reaction: hives, swells ACE Inhibitors Allergy Notes Electronic Signature(s) Signed: 01/21/2015 5:11:11 PM By: Gretta Cool, RN, BSN, Kim RN, BSN Entered By: Gretta Cool, RN, BSN, Kim on 01/21/2015 14:53:32 Khatib, [name] ([id number]) -------------------------------------------------------------------------------- Arrival Information Details Patient Name: [name] Date of Service: 01/21/2015 2:30 PM Medical Record Number: [id number] Patient Account Number: [id number] Date of Birth/Sex: Jul 29, 1955 (59 y.o. Female) Treating RN: Cornell Barman Primary Care Physician: Orlene Erm Other Clinician: Referring Physician: Christoper Fabian Treating Physician/Extender: Frann Rider in Treatment: 0 Visit Information Patient Arrived: [name] Arrival Time: 14:41 Accompanied By: self Transfer Assistance: None Patient Identification Verified: Yes Patient Has Alerts: Yes Patient Alerts: Borderline Diabetic History Since Last Visit Electronic Signature(s) Signed: 01/21/2015 5:11:11 PM By: Gretta Cool, RN, BSN, Kim RN, BSN Entered By: Gretta Cool, RN, BSN, Kim on 01/21/2015 14:42:46 Coiner, [name] ([id number]) -------------------------------------------------------------------------------- Clinic Level of Care Assessment Details Patient Name: [name], [name]. Date of Service: 01/21/2015 2:30 PM Medical Record Number: [id number] Patient Account Number: [id number] Date of Birth/Sex: 10-25-1955 (59 y.o. Female) Treating RN:  Cornell Barman Primary Care Physician: Orlene Erm Other Clinician: Referring Physician: Christoper Fabian Treating Physician/Extender: Frann Rider in Treatment: 0 Clinic Level of Care Assessment Items TOOL 1 Quantity Score []  - Use when EandM and Procedure is performed on INITIAL visit 0 ASSESSMENTS - Nursing Assessment / Reassessment X - General Physical Exam (combine w/ comprehensive assessment (listed just 1 20 below) when performed on new pt. evals) X - Comprehensive Assessment (HX, ROS, Risk Assessments, Wounds Hx, etc.) 1 25 ASSESSMENTS - Wound and Skin Assessment / Reassessment []  - Dermatologic / Skin Assessment (not related to wound area) 0 ASSESSMENTS - Ostomy and/or Continence Assessment and Care []  - Incontinence Assessment and Management 0 []  - Ostomy Care Assessment and Management (repouching, etc.) 0 PROCESS - Coordination of Care X - Simple Patient / Family Education for ongoing care 1 15 []  - Complex (extensive) Patient / Family Education for ongoing care 0 []  - Staff obtains Programmer, systems, Records, Test Results / Process Orders 0 []  - Staff telephones HHA, Nursing Homes / Clarify orders / etc 0 []  - Routine Transfer to another Facility (non-emergent condition) 0 []  - Routine Hospital Admission (non-emergent condition) 0 X - New Admissions / Biomedical engineer / Ordering NPWT, Apligraf, etc. 1 15 []  - Emergency Hospital Admission (emergent condition) 0 PROCESS - Special Needs []  - Pediatric / Minor Patient Management 0 []  - Isolation Patient Management 0 Sigmon, Darlynn C. ([id number]) []  - Hearing / Language / Visual special needs 0 []  - Assessment of Community assistance (transportation, D/C planning, etc.) 0 []  - Additional assistance / Altered mentation 0 []  - Support Surface(s) Assessment (bed, cushion, seat, etc.) 0 INTERVENTIONS - Miscellaneous []  - External ear exam 0 []  - Patient Transfer (multiple staff / Civil Service fast streamer / Similar devices) 0 []  -  Simple Staple / Suture removal (25 or less) 0 []  - Complex Staple / Suture removal (26 or more) 0 []  - Hypo/Hyperglycemic Management (do not check if billed separately) 0 []  - Ankle / Brachial Index (ABI) - do not check if billed separately 0 Has the patient been  seen at the hospital within the last three years: Yes Total Score: 75 Level Of Care: New/Established - Level 2 Electronic Signature(s) Signed: 01/21/2015 5:59:39 PM By: Gretta Cool, RN, BSN, Kim RN, BSN Previous Signature: 01/21/2015 5:11:11 PM Version By: Gretta Cool, RN, BSN, Kim RN, BSN Entered By: Gretta Cool, RN, BSN, Kim on 01/21/2015 17:34:23 Coaxum, [name] ([id number]) -------------------------------------------------------------------------------- Encounter Discharge Information Details Patient Name: [name], [name]. Date of Service: 01/21/2015 2:30 PM Medical Record Number: [id number] Patient Account Number: [id number] Date of Birth/Sex: 1956/03/15 (59 y.o. Female) Treating RN: Cornell Barman Primary Care Physician: Orlene Erm Other Clinician: Referring Physician: Christoper Fabian Treating Physician/Extender: Frann Rider in Treatment: 0 Encounter Discharge Information Items Discharge Pain Level: 0 Discharge Condition: Stable Ambulatory Status: Cane Discharge Destination: Home Private Transportation: Auto Accompanied By: self Schedule Follow-up Appointment: Yes Medication Reconciliation completed and Yes provided to Patient/Care Ashonte Angelucci: Clinical Summary of Care: Electronic Signature(s) Signed: 01/21/2015 5:11:11 PM By: Gretta Cool, RN, BSN, Kim RN, BSN Entered By: Gretta Cool, RN, BSN, Kim on 01/21/2015 15:50:32 Lok, [name] ([id number]) -------------------------------------------------------------------------------- Lower Extremity Assessment Details Patient Name: [name], [name]. Date of Service: 01/21/2015 2:30 PM Medical Record Number: [id number] Patient Account Number: [id number] Date of Birth/Sex: November 06, 1955 (59  y.o. Female) Treating RN: Cornell Barman Primary Care Physician: Orlene Erm Other Clinician: Referring Physician: Christoper Fabian Treating Physician/Extender: Frann Rider in Treatment: 0 Edema Assessment Assessed: [Left: No] [Right: No] E[Left: dema] [Right: :] Calf Left: Right: Point of Measurement: 28 cm From Medial Instep cm 84.5 cm Ankle Left: Right: Point of Measurement: 11 cm From Medial Instep cm 56.8 cm Vascular Assessment Pulses: Posterior Tibial Dorsalis Pedis Palpable: [Right:No] Doppler: [Right:Monophasic] Extremity colors, hair growth, and conditions: Extremity Color: [Right:Red] Hair Growth on Extremity: [Right:No] Temperature of Extremity: [Right:Warm] Capillary Refill: [Right:< 3 seconds] Toe Nail Assessment Left: Right: Thick: Yes Discolored: Yes Deformed: Yes Improper Length and Hygiene: Yes Electronic Signature(s) Signed: 01/21/2015 5:11:11 PM By: Gretta Cool, RN, BSN, Kim RN, BSN Entered By: Gretta Cool, RN, BSN, Kim on 01/21/2015 14:48:27 Piccione, [name] ([id number]) Wever, [name] ([id number]) -------------------------------------------------------------------------------- Multi Wound Chart Details Patient Name: [name]. Date of Service: 01/21/2015 2:30 PM Medical Record Number: [id number] Patient Account Number: [id number] Date of Birth/Sex: [birth year] (59 y.o. Female) Treating RN: Cornell Barman Primary Care Physician: Orlene Erm Other Clinician: Referring Physician: Christoper Fabian Treating Physician/Extender: Frann Rider in Treatment: 0 Vital Signs Height(in): 63 Pulse(bpm): 76 Weight(lbs): 396 Blood Pressure 127/65 (mmHg): Body Mass Index(BMI): 70 Temperature(F): 98.5 Respiratory Rate 20 (breaths/min): Photos: [2:No Photos] [N/A:N/A] Wound Location: [2:Right Lower Leg - Circumfernential] [N/A:N/A] Wounding Event: [2:Gradually Appeared] [N/A:N/A] Primary Etiology: [2:Lymphedema] [N/A:N/A] Date Acquired:  [2:01/07/2015] [N/A:N/A] Weeks of Treatment: [2:0] [N/A:N/A] Wound Status: [2:Open] [N/A:N/A] Clustered Wound: [2:Yes] [N/A:N/A] Measurements L x W x D 68x18x0.1 [N/A:N/A] (cm) Area (cm) : [2:961.327] [N/A:N/A] Volume (cm) : [2:96.133] [N/A:N/A] % Reduction in Area: [2:0.00%] [N/A:N/A] % Reduction in Volume: 0.00% [N/A:N/A] Classification: [2:Partial Thickness] [N/A:N/A] Exudate Amount: [2:Large] [N/A:N/A] Exudate Type: [2:Serous] [N/A:N/A] Exudate Color: [2:amber] [N/A:N/A] Foul Odor After [2:Yes] [N/A:N/A] Cleansing: Odor Anticipated Due to No [N/A:N/A] Product Use: Wound Margin: [2:Indistinct, nonvisible] [N/A:N/A] Granulation Amount: [2:Medium (34-66%)] [N/A:N/A] Granulation Quality: [2:Red, Pink] [N/A:N/A] Necrotic Amount: [2:Medium (34-66%)] [N/A:N/A] Exposed Structures: [2:Fascia: No Fat: No Tendon: No] [N/A:N/A] Muscle: No Joint: No Bone: No Limited to Skin Breakdown Periwound Skin Texture: Edema: No N/A N/A Excoriation: No Induration: No Callus: No Crepitus: No Fluctuance: No Friable: No Rash: No Scarring: No Periwound Skin Maceration: Yes N/A N/A Moisture: Moist: Yes  Dry/Scaly: No Periwound Skin Color: Atrophie Blanche: No N/A N/A Cyanosis: No Ecchymosis: No Erythema: No Hemosiderin Staining: No Mottled: No Pallor: No Rubor: No Tenderness on No N/A N/A Palpation: Wound Preparation: Ulcer Cleansing: N/A N/A Rinsed/Irrigated with Saline Topical Anesthetic Applied: Other: lidocaine 4% Treatment Notes Electronic Signature(s) Signed: 01/21/2015 5:11:11 PM By: Gretta Cool, RN, BSN, Kim RN, BSN Entered By: Gretta Cool, RN, BSN, Kim on 01/21/2015 15:03:09 [name] ([id number]) -------------------------------------------------------------------------------- Multi-Disciplinary Care Plan Details Patient Name: [name], [name]. Date of Service: 01/21/2015 2:30 PM Medical Record Number: [id number] Patient Account Number: [id number] Date of Birth/Sex:  10-11-1955 (59 y.o. Female) Treating RN: Cornell Barman Primary Care Physician: Orlene Erm Other Clinician: Referring Physician: Christoper Fabian Treating Physician/Extender: Frann Rider in Treatment: 0 Active Inactive Abuse / Safety / Falls / Self Care Management Nursing Diagnoses: Potential for falls Goals: Patient will remain injury free Date Initiated: 01/21/2015 Goal Status: Active Interventions: Assess fall risk on admission and as needed Notes: Nutrition Nursing Diagnoses: Impaired glucose control: actual or potential Goals: Patient/caregiver will maintain therapeutic glucose control Date Initiated: 01/21/2015 Goal Status: Active Interventions: Assess patient nutrition upon admission and as needed per policy Notes: Orientation to the Wound Care Program Nursing Diagnoses: Knowledge deficit related to the wound healing center program Goals: Patient/caregiver will verbalize understanding of the [facility] Date Initiated: 01/21/2015 [name], [name] ([id number]) Goal Status: Active Interventions: Provide education on orientation to the wound center Notes: Soft Tissue Infection Nursing Diagnoses: Impaired tissue integrity Goals: Patient will remain free of wound infection Date Initiated: 01/21/2015 Goal Status: Active Interventions: Assess signs and symptoms of infection every visit Notes: Electronic Signature(s) Signed: 01/21/2015 5:11:11 PM By: Gretta Cool, RN, BSN, Kim RN, BSN Entered By: Gretta Cool, RN, BSN, Kim on 01/21/2015 15:02:57 Tackitt, [name] ([id number]) -------------------------------------------------------------------------------- Non-Wound Condition Assessment Details Patient Name: [name]. Date of Service: 01/21/2015 2:30 PM Medical Record Number: [id number] Patient Account Number: [id number] Date of Birth/Sex: January 04, 1956 (59 y.o. Female) Treating RN: Cornell Barman Primary Care Physician: Orlene Erm Other  Clinician: Referring Physician: Christoper Fabian Treating Physician/Extender: Frann Rider in Treatment: 0 Non-Wound Condition: Condition: Lymphedema Location: Leg Side: Right Periwound Skin Texture Texture Color No Abnormalities Noted: No No Abnormalities Noted: No Callus: No Atrophie Blanche: No Crepitus: No Cyanosis: No Excoriation: No Ecchymosis: No Fluctuance: No Erythema: No Friable: No Hemosiderin Staining: No Induration: No Mottled: Yes Localized Edema: No Pallor: No Rash: No Rubor: No Scarring: No Temperature / Pain Moisture Temperature: No Abnormality No Abnormalities Noted: No Dry / Scaly: No Maceration: Yes Moist: Yes Notes Stage III Lymphedema; excessive drainage Electronic Signature(s) Signed: 01/21/2015 5:59:39 PM By: Gretta Cool, RN, BSN, Kim RN, BSN Previous Signature: 01/21/2015 5:11:11 PM Version By: Gretta Cool, RN, BSN, Kim RN, BSN Entered By: Gretta Cool, RN, BSN, Kim on 01/21/2015 17:23:49 Pemberton, [name] ([id number]) -------------------------------------------------------------------------------- Pain Assessment Details Patient Name: [name], [name] Date of Service: 01/21/2015 2:30 PM Medical Record Number: [id number] Patient Account Number: [id number] Date of Birth/Sex: 1955-12-06 (59 y.o. Female) Treating RN: Cornell Barman Primary Care Physician: Orlene Erm Other Clinician: Referring Physician: Christoper Fabian Treating Physician/Extender: Frann Rider in Treatment: 0 Active Problems Location of Pain Severity and Description of Pain Patient Has Paino No Site Locations Pain Management and Medication Current Pain Management: Electronic Signature(s) Signed: 01/21/2015 5:11:11 PM By: Gretta Cool, RN, BSN, Kim RN, BSN Entered By: Gretta Cool, RN, BSN, Kim on 01/21/2015 14:42:53 Duren, [name] ([id number]) -------------------------------------------------------------------------------- Patient/Caregiver Education Details Patient Name: [name] Date of Service: 01/21/2015  2:30 PM Medical Record Number: [id number] Patient Account Number: [id number] Date of Birth/Gender: [birth year] (59 y.o. Female) Treating RN: Cornell Barman Primary Care Physician: Orlene Erm Other Clinician: Referring Physician: Christoper Fabian Treating Physician/Extender: Frann Rider in Treatment: 0 Education Assessment Education Provided To: Patient Education Topics Provided Wound/Skin Impairment: Caring for Your Ulcer, Other: calll WCC if wrap gets too tight or wet. Remove if [place] is not Handouts: Civil engineer, contracting) Signed: 01/21/2015 5:11:11 PM By: Gretta Cool, RN, BSN, Kim RN, BSN Entered By: Gretta Cool, RN, BSN, Kim on 01/21/2015 15:51:18 [place], [name] ([id number]) -------------------------------------------------------------------------------- Vitals Details Patient Name: [name] Date of Service: 01/21/2015 2:30 PM Medical Record Number: [id number] Patient Account Number: [id number] Date of Birth/Sex: Aug 19, 1955 (59 y.o. Female) Treating RN: Cornell Barman Primary Care Physician: Orlene Erm Other Clinician: Referring Physician: Christoper Fabian Treating Physician/Extender: Frann Rider in Treatment: 0 Vital Signs Time Taken: 14:42 Temperature (F): 98.5 Height (in): 63 Pulse (bpm): 76 Source: Stated Respiratory Rate (breaths/min): 20 Weight (lbs): 396 Blood Pressure (mmHg): 127/65 Body Mass Index (BMI): 70.1 Reference Range: 80 - 120 mg / dl Electronic Signature(s) Signed: 01/21/2015 5:11:11 PM By: Gretta Cool, RN, BSN, Kim RN, BSN Entered By: Gretta Cool, RN, BSN, Kim on 01/21/2015 14:45:23

## 2015-01-23 ENCOUNTER — Inpatient Hospital Stay
Admission: EM | Admit: 2015-01-23 | Discharge: 2015-01-28 | DRG: 603 | Disposition: A | Discharge status: Home / Self Care | Payer: Medicaid Other | Attending: Specialist | Admitting: Specialist

## 2015-01-23 ENCOUNTER — Encounter: Payer: Self-pay | Admitting: Emergency Medicine

## 2015-01-23 ENCOUNTER — Emergency Department: Payer: Medicaid Other

## 2015-01-23 DIAGNOSIS — J45909 Unspecified asthma, uncomplicated: Secondary | ICD-10-CM | POA: Diagnosis present

## 2015-01-23 DIAGNOSIS — I517 Cardiomegaly: Secondary | ICD-10-CM | POA: Diagnosis present

## 2015-01-23 DIAGNOSIS — M1990 Unspecified osteoarthritis, unspecified site: Secondary | ICD-10-CM | POA: Diagnosis present

## 2015-01-23 DIAGNOSIS — Z79899 Other long term (current) drug therapy: Secondary | ICD-10-CM

## 2015-01-23 DIAGNOSIS — R52 Pain, unspecified: Secondary | ICD-10-CM

## 2015-01-23 DIAGNOSIS — I10 Essential (primary) hypertension: Secondary | ICD-10-CM | POA: Diagnosis present

## 2015-01-23 DIAGNOSIS — R21 Rash and other nonspecific skin eruption: Secondary | ICD-10-CM | POA: Diagnosis present

## 2015-01-23 DIAGNOSIS — L03115 Cellulitis of right lower limb: Principal | ICD-10-CM | POA: Diagnosis present

## 2015-01-23 DIAGNOSIS — L0390 Cellulitis, unspecified: Secondary | ICD-10-CM | POA: Diagnosis present

## 2015-01-23 DIAGNOSIS — Z95828 Presence of other vascular implants and grafts: Secondary | ICD-10-CM

## 2015-01-23 DIAGNOSIS — I890 Lymphedema, not elsewhere classified: Secondary | ICD-10-CM | POA: Diagnosis present

## 2015-01-23 DIAGNOSIS — Z6841 Body Mass Index (BMI) 40.0 and over, adult: Secondary | ICD-10-CM

## 2015-01-23 DIAGNOSIS — L97819 Non-pressure chronic ulcer of other part of right lower leg with unspecified severity: Secondary | ICD-10-CM | POA: Diagnosis present

## 2015-01-23 DIAGNOSIS — Z87891 Personal history of nicotine dependence: Secondary | ICD-10-CM

## 2015-01-23 DIAGNOSIS — G4733 Obstructive sleep apnea (adult) (pediatric): Secondary | ICD-10-CM | POA: Diagnosis present

## 2015-01-23 DIAGNOSIS — Z888 Allergy status to other drugs, medicaments and biological substances status: Secondary | ICD-10-CM

## 2015-01-23 DIAGNOSIS — Z7951 Long term (current) use of inhaled steroids: Secondary | ICD-10-CM

## 2015-01-23 HISTORY — DX: Unspecified osteoarthritis, unspecified site: M19.90

## 2015-01-23 HISTORY — DX: Essential (primary) hypertension: I10

## 2015-01-23 HISTORY — DX: Cardiomegaly: I51.7

## 2015-01-23 HISTORY — DX: Sleep apnea, unspecified: G47.30

## 2015-01-23 HISTORY — DX: Unspecified asthma, uncomplicated: J45.909

## 2015-01-23 LAB — CBC WITH DIFFERENTIAL/PLATELET
BASOS PCT: 0 %
Basophils Absolute: 0 10*3/uL (ref 0–0.1)
EOS ABS: 0 10*3/uL (ref 0–0.7)
Eosinophils Relative: 0 %
HEMATOCRIT: 36 % (ref 35.0–47.0)
HEMOGLOBIN: 11.6 g/dL — AB (ref 12.0–16.0)
LYMPHS ABS: 1.8 10*3/uL (ref 1.0–3.6)
Lymphocytes Relative: 23 %
MCH: 25.6 pg — AB (ref 26.0–34.0)
MCHC: 32.3 g/dL (ref 32.0–36.0)
MCV: 79.2 fL — AB (ref 80.0–100.0)
MONO ABS: 1 10*3/uL — AB (ref 0.2–0.9)
Monocytes Relative: 13 %
Neutro Abs: 5.1 10*3/uL (ref 1.4–6.5)
Neutrophils Relative %: 64 %
Platelets: 149 10*3/uL — ABNORMAL LOW (ref 150–440)
RBC: 4.55 MIL/uL (ref 3.80–5.20)
RDW: 14.9 % — ABNORMAL HIGH (ref 11.5–14.5)
WBC: 8 10*3/uL (ref 3.6–11.0)

## 2015-01-23 LAB — BASIC METABOLIC PANEL
Anion gap: 7 (ref 5–15)
BUN: 12 mg/dL (ref 6–20)
CO2: 26 mmol/L (ref 22–32)
CREATININE: 0.82 mg/dL (ref 0.44–1.00)
Calcium: 8 mg/dL — ABNORMAL LOW (ref 8.9–10.3)
Chloride: 103 mmol/L (ref 101–111)
GFR calc Af Amer: >60 mL/min (ref >60)
GFR calc non Af Amer: >60 mL/min (ref >60)
Glucose, Bld: 88 mg/dL (ref 65–99)
Potassium: 4.1 mmol/L (ref 3.5–5.1)
Sodium: 136 mmol/L (ref 135–145)

## 2015-01-23 MED ORDER — AMLODIPINE BESYLATE 5 MG PO TABS
2.5000 mg | ORAL_TABLET | Freq: Every day | ORAL | Status: DC
  Administered 2015-01-24 – 2015-01-28 (×5): 2.5 mg via ORAL
  Filled 2015-01-23 (×5): qty 1

## 2015-01-23 MED ORDER — ACETAMINOPHEN 650 MG RE SUPP: 650.0000 mg | Freq: Four times a day (QID) | RECTAL | Status: DC | PRN

## 2015-01-23 MED ORDER — ATENOLOL 50 MG PO TABS
50.0000 mg | ORAL_TABLET | Freq: Every day | ORAL | Status: DC
  Administered 2015-01-24 – 2015-01-28 (×4): 50 mg via ORAL
  Filled 2015-01-23 (×5): qty 1

## 2015-01-23 MED ORDER — IOHEXOL 300 MG/ML  SOLN
100.0000 mL | Freq: Once | INTRAMUSCULAR | Status: AC | PRN
  Administered 2015-01-23: 100 mL via INTRAVENOUS

## 2015-01-23 MED ORDER — SODIUM CHLORIDE 0.9 % IV SOLN
INTRAVENOUS | Status: DC
  Administered 2015-01-23 – 2015-01-26 (×5): via INTRAVENOUS

## 2015-01-23 MED ORDER — OXYCODONE HCL 5 MG PO TABS
5.0000 mg | ORAL_TABLET | ORAL | Status: DC | PRN
  Administered 2015-01-24 – 2015-01-28 (×2): 5 mg via ORAL
  Filled 2015-01-23 (×2): qty 1

## 2015-01-23 MED ORDER — ACETAMINOPHEN 325 MG PO TABS
650.0000 mg | ORAL_TABLET | Freq: Four times a day (QID) | ORAL | Status: DC | PRN
  Administered 2015-01-24: 650 mg via ORAL

## 2015-01-23 MED ORDER — POLYETHYLENE GLYCOL 3350 17 G PO PACK
17.0000 g | PACK | Freq: Every day | ORAL | Status: DC | PRN
  Filled 2015-01-23: qty 1

## 2015-01-23 MED ORDER — VANCOMYCIN HCL 10 G IV SOLR
1250.0000 mg | Freq: Three times a day (TID) | INTRAVENOUS | Status: DC
  Administered 2015-01-24: 1250 mg via INTRAVENOUS
  Filled 2015-01-23 (×3): qty 1250

## 2015-01-23 MED ORDER — VANCOMYCIN HCL IN DEXTROSE 1-5 GM/200ML-% IV SOLN
1000.0000 mg | Freq: Once | INTRAVENOUS | Status: AC
  Administered 2015-01-23: 1000 mg via INTRAVENOUS
  Filled 2015-01-23: qty 200

## 2015-01-23 MED ORDER — ONDANSETRON HCL 4 MG PO TABS: 4.0000 mg | ORAL_TABLET | Freq: Four times a day (QID) | ORAL | Status: DC | PRN

## 2015-01-23 MED ORDER — MORPHINE SULFATE 2 MG/ML IJ SOLN: 2.0000 mg | INTRAMUSCULAR | Status: DC | PRN

## 2015-01-23 MED ORDER — HEPARIN SODIUM (PORCINE) 5000 UNIT/ML IJ SOLN
5000.0000 [IU] | Freq: Three times a day (TID) | INTRAMUSCULAR | Status: DC
  Administered 2015-01-24 – 2015-01-28 (×14): 5000 [IU] via SUBCUTANEOUS
  Filled 2015-01-23 (×14): qty 1

## 2015-01-23 MED ORDER — ONDANSETRON HCL 4 MG/2ML IJ SOLN: 4.0000 mg | Freq: Four times a day (QID) | INTRAMUSCULAR | Status: DC | PRN

## 2015-01-23 NOTE — ED Notes (Signed)
Pt taken to ct 

## 2015-01-23 NOTE — Progress Notes (Signed)
ANTIBIOTIC CONSULT NOTE - INITIAL  Pharmacy Consult for Vancomycin Dosing Indication: Cellulitis   Allergies  Allergen Reactions  . Ace Inhibitors   . Ibuprofen     Patient Measurements: Height: 5' 3.5" (161.3 cm) Weight: (!) 396 lb (179.624 kg) IBW/kg (Calculated) : 53.55 Adjusted Body Weight: 105kg  Vital Signs: Temp: 98.5 F (36.9 C) (07/17 1527) Temp Source: Oral (07/17 1527) BP: 135/62 mmHg (07/17 1527) Pulse Rate: 77 (07/17 1527) Intake/Output from previous day:   Intake/Output from this shift:    Labs:  Recent Labs  01/23/15 1745  WBC 8.0  HGB 11.6*  PLT 149*  CREATININE 0.82   Estimated Creatinine Clearance: 121.3 mL/min (by C-G formula based on Cr of 0.82). No results for input(s): VANCOTROUGH, VANCOPEAK, VANCORANDOM, GENTTROUGH, GENTPEAK, GENTRANDOM, TOBRATROUGH, TOBRAPEAK, TOBRARND, AMIKACINPEAK, AMIKACINTROU, AMIKACIN in the last 72 hours.   Microbiology: No results found for this or any previous visit (from the past 720 hour(s)).  Medical History: Past Medical History  Diagnosis Date  . Hypertension   . Enlarged heart   . Arthritis   . Sleep apnea   . Asthma     Medications:  Scheduled:  . amLODipine  2.5 mg Oral Daily  . heparin  5,000 Units Subcutaneous 3 times per day   Infusions:  . sodium chloride    . [START ON 01/24/2015] vancomycin     Assessment: Pharmacy consulted to dose vancomycin for 59 yo obese female being treated for cellulitis.    Goal of Therapy:  Vancomycin trough level 10-15 mcg/ml  Plan:  Will start patient on vancomycin 1250mg  IV Q8hr for goal trough of 10-15. Will obtain trough prior to 0900 dose on 7/19. Will need to watch serum creatinine/creatinine clearance closely as patient is at high risk for accumulation.    Pharmacy will continue to monitor and adjust per consult.    Caylon Saine L 01/23/2015,9:54 PM

## 2015-01-23 NOTE — ED Notes (Signed)
Introduced self to pt. No distress noted at this time, no needs per patient, visitor at bedside, cont to monitor

## 2015-01-23 NOTE — ED Notes (Signed)
Pt went to her PCP on Wednesday and was diagnosed with cellulitis of right leg. She was referred to wound clinic on Friday and was told to come to ED if pain or drainage got worse. She states it is getting worse and she is having trouble keeping up with the drainage. She currently has 3 depends wrapped around the leg and it is still weeping through. Pain 6/10. Pt alert & oriented.

## 2015-01-23 NOTE — ED Provider Notes (Signed)
[facility] Emergency Department Provider Note    ____________________________________________  Time seen: 1712  I have reviewed the triage vital signs and the nursing notes.   HISTORY  Chief Complaint Cellulitis   History limited by: Not Limited   HPI [name] is a 59 y.o. female who presents to the emergency department today because of concerns of worsening cellulitis of her right leg. The patient states she went to her primary care doctor's office 4 days ago for right leg swelling and redness. Patient does have, lymphedema that time her doctor placed her on oral antibiotics for cellulitis. She went to the wound clinic clinic 2 days ago where they wrapped it. She states since then it has continued to get worse and is required more wrapping. She has had pain with this. She denies any fevers.   Past Medical History  Diagnosis Date  . Hypertension   . Enlarged heart   . Arthritis   . Sleep apnea     There are no active problems to display for this patient.   History reviewed. No pertinent past surgical history.  No current outpatient prescriptions on file.  Allergies Ace inhibitors and Ibuprofen  History reviewed. No pertinent family history.  Social History History  Substance Use Topics  . Smoking status: Former Research scientist (life sciences)  . Smokeless tobacco: Current User    Types: Snuff  . Alcohol Use: No    Review of Systems  Constitutional: Negative for fever. Cardiovascular: Negative for chest pain. Respiratory: Negative for shortness of breath. Gastrointestinal: Negative for abdominal pain, vomiting and diarrhea. Genitourinary: Negative for dysuria. Musculoskeletal: Negative for back pain. Skin: Right leg redness and swelling Neurological: Negative for headaches, focal weakness or numbness.  10-point ROS otherwise negative.  ____________________________________________   PHYSICAL EXAM:  VITAL SIGNS: ED Triage Vitals  Enc  Vitals Group     BP 01/23/15 1527 135/62 mmHg     Pulse Rate 01/23/15 1527 77     Resp --      Temp 01/23/15 1527 98.5 F (36.9 C)     Temp Source 01/23/15 1527 Oral     SpO2 01/23/15 1527 99 %     Weight 01/23/15 1527 396 lb (179.624 kg)     Height 01/23/15 1527 5' 3.5" (1.613 m)     Head Cir --      Peak Flow --      Pain Score 01/23/15 1528 6   Constitutional: Alert and oriented. Well appearing and in no distress. Eyes: Conjunctivae are normal. PERRL. Normal extraocular movements. ENT   Head: Normocephalic and atraumatic.   Nose: No congestion/rhinnorhea.   Mouth/Throat: Mucous membranes are moist.   Neck: No stridor. Hematological/Lymphatic/Immunilogical: No cervical lymphadenopathy. Cardiovascular: Normal rate, regular rhythm.  No murmurs, rubs, or gallops. Respiratory: Normal respiratory effort without tachypnea nor retractions. Breath sounds are clear and equal bilaterally. No wheezes/rales/rhonchi. Gastrointestinal: Soft and nontender. No distention. There is no CVA tenderness. Genitourinary: Deferred Musculoskeletal: Patient with bilateral lower extremity sequela of chronic lymphedema. Right leg with erythema, draining and foul odor. Tender to palpation. Neurologic:  Normal speech and language. No gross focal neurologic deficits are appreciated. Speech is normal.  Skin:  Skin is warm, dry and intact. No rash noted. Psychiatric: Mood and affect are normal. Speech and behavior are normal. Patient exhibits appropriate insight and judgment.  ____________________________________________    LABS (pertinent positives/negatives)  Labs Reviewed  CBC WITH DIFFERENTIAL/PLATELET - Abnormal; Notable for the following:    Hemoglobin 11.6 (*)  MCV 79.2 (*)    MCH 25.6 (*)    RDW 14.9 (*)    Platelets 149 (*)    Monocytes Absolute 1.0 (*)    All other components within normal limits  BASIC METABOLIC PANEL - Abnormal; Notable for the following:    Calcium 8.0 (*)     All other components within normal limits  CULTURE, BLOOD (ROUTINE X 2)  CULTURE, BLOOD (ROUTINE X 2)  CBC  CREATININE, SERUM     ____________________________________________   EKG  None  ____________________________________________    RADIOLOGY  CT right lower extremity  IMPRESSION: 1. Circumferential edema within the subcutaneous fat throughout the right distal femur, lower leg and ankle with overlying skin thickening. Differential considerations include anasarca versus cellulitis. No drainable fluid collection to suggest an abscess. No soft tissue emphysema.    X-ray interpretation: Anatomical Location: right tib/fib  Number of Views: CT Scan  Pertinent positive/negative findings: No subcutaneous gas  Clinical Impression: swelling, no subcutaneous gas, cellulitis   ____________________________________________   PROCEDURES  Procedure(s) performed: None  Critical Care performed: No  ____________________________________________   INITIAL IMPRESSION / ASSESSMENT AND PLAN / ED COURSE  Pertinent labs & imaging results that were available during my care of the patient were reviewed by me and considered in my medical decision making (see chart for details).  Patient here with significant cellulitis and lymphedema of the right leg. Will get CT scan to evaluate for gas.  ----------------------------------------- 9:31 PM on 01/23/2015 -----------------------------------------  CT scan does not show any subcutaneous gas. Given the patient's failed outpatient abx for cellulitis will admit for IV antibiotics and further management.  ____________________________________________   FINAL CLINICAL IMPRESSION(S) / ED DIAGNOSES  Final diagnoses:  Pain  Cellulitis   Nance Pear, MD 01/23/15 2131

## 2015-01-23 NOTE — ED Notes (Signed)
Pt assisted to stand on scale to get an actual weight for the ct scanner table. Weight obtained of 386.6 lbs. Ct notified, bed changed, pad given to pt per request

## 2015-01-23 NOTE — ED Notes (Signed)
Pt from home c/o cellulitis in left leg. Has been seeing wound clinic and recently on oral antibiotics for cellulitis without significant improvement. Pain 2/10 at this time. Foul odor noted post dressing removal as well as purulent drainage. Hx lymphedema.

## 2015-01-23 NOTE — H&P (Signed)
[place] [facility] [place] NAME: [name]    MR#:  [id number]  DATE OF BIRTH:  Dec 17, 1955   DATE OF ADMISSION:  01/23/2015  PRIMARY CARE PHYSICIAN: Leotis Shames, MD   REQUESTING/REFERRING PHYSICIAN: Archie Balboa  CHIEF COMPLAINT:   Chief Complaint  Patient presents with  . Cellulitis    HISTORY OF PRESENT ILLNESS:  [name]  is a 59 y.o. female with a known history of chronic lower extremity lymphedema presenting with worsening swelling and drainage right leg. She describes approximately two-week duration of weeping edema which is been progressively worsening and right lower extremity. She is a parathyroid by her PCP and diagnosed with right lower extremity cellulitis for which she was started on clindamycin as an outpatient for 450 mg by mouth 4 times daily. Despite this she actually states that the erythema has worsening then moved proximally. She denies any fevers or chills at this time.  PAST MEDICAL HISTORY:   Past Medical History  Diagnosis Date  . Hypertension   . Enlarged heart   . Arthritis   . Sleep apnea   . Asthma     PAST SURGICAL HISTORY:  History reviewed. No pertinent past surgical history.  SOCIAL HISTORY:   History  Substance Use Topics  . Smoking status: Former Research scientist (life sciences)  . Smokeless tobacco: Current User    Types: Snuff  . Alcohol Use: No    FAMILY HISTORY:   Family History  Problem Relation Age of Onset  . Thyroid disease Other     DRUG ALLERGIES:   Allergies  Allergen Reactions  . Ace Inhibitors   . Ibuprofen     REVIEW OF SYSTEMS:  REVIEW OF SYSTEMS:  CONSTITUTIONAL: Denies fevers, chills, fatigue, weakness.  EYES: Denies blurred vision, double vision, or eye pain.  EARS, NOSE, THROAT: Denies tinnitus, ear pain, hearing loss.  RESPIRATORY: denies cough, shortness of breath, wheezing  CARDIOVASCULAR: Denies chest pain, palpitations, positive edema.  GASTROINTESTINAL:  Denies nausea, vomiting, diarrhea, abdominal pain.  GENITOURINARY: Denies dysuria, hematuria.  ENDOCRINE: Denies nocturia or thyroid problems. HEMATOLOGIC AND LYMPHATIC: Denies easy bruising or bleeding.  SKIN: Bilateral skin changes consistent with lymphedema, erythematous lesion on right leg as described above MUSCULOSKELETAL: Denies pain in neck, back, shoulder, knees, hips, or further arthritic symptoms.  NEUROLOGIC: Denies paralysis, paresthesias.  PSYCHIATRIC: Denies anxiety or depressive symptoms. Otherwise full review of systems performed by me is negative.   MEDICATIONS AT HOME:   Prior to Admission medications   Not on File      VITAL SIGNS:  Blood pressure 135/62, pulse 77, temperature 98.5 F (36.9 C), temperature source Oral, height 5' 3.5" (1.613 m), weight 396 lb (179.624 kg), SpO2 99 %.  PHYSICAL EXAMINATION:  VITAL SIGNS: Filed Vitals:   01/23/15 1527  BP: 135/62  Pulse: 77  Temp: 98.5 F (36.9 C)   GENERAL:59 y.o.female currently in no acute distress. Obese HEAD: Normocephalic, atraumatic.  EYES: Pupils equal, round, reactive to light. Extraocular muscles intact. No scleral icterus.  MOUTH: Moist mucosal membrane. Dentition intact. No abscess noted.  EAR, NOSE, THROAT: Clear without exudates. No external lesions.  NECK: Supple. No thyromegaly. No nodules. No JVD.  PULMONARY: Clear to ascultation, without wheeze rails or rhonci. No use of accessory muscles, Good respiratory effort. good air entry bilaterally CHEST: Nontender to palpation.  CARDIOVASCULAR: S1 and S2. Regular rate and rhythm. No murmurs, rubs, or gallops. Bilateral nonpitting edema right leg worse than left. Pedal pulses  2+ bilaterally.  GASTROINTESTINAL: Soft, nontender, nondistended. No masses. Positive bowel sounds. No hepatosplenomegaly.  MUSCULOSKELETAL: No swelling, clubbing, or edema other than are mentioned. Range of motion full in all extremities.  NEUROLOGIC: Cranial nerves II  through XII are intact. No gross focal neurological deficits. Sensation intact. Reflexes intact.  SKIN: Weeping edema right lower extremity with associated erythema from the ankle to the knee which is warm to touch, No further ulceration, lesions, rashes, or cyanosis. Skin warm and dry. Turgor intact.  PSYCHIATRIC: Mood, affect within normal limits. The patient is awake, alert and oriented x 3. Insight, judgment intact.    LABORATORY PANEL:   CBC  Recent Labs Lab 01/23/15 1745  WBC 8.0  HGB 11.6*  HCT 36.0  PLT 149*   ------------------------------------------------------------------------------------------------------------------  Chemistries   Recent Labs Lab 01/23/15 1745  NA 136  K 4.1  CL 103  CO2 26  GLUCOSE 88  BUN 12  CREATININE 0.82  CALCIUM 8.0*   ------------------------------------------------------------------------------------------------------------------  Cardiac Enzymes No results for input(s): TROPONINI in the last 168 hours. ------------------------------------------------------------------------------------------------------------------  RADIOLOGY:  Ct Tibia Fibula Right W Contrast  01/23/2015   CLINICAL DATA:  Right leg pain  EXAM: CT OF THE LOWER RIGHT EXTREMITY WITH CONTRAST  TECHNIQUE: Multidetector CT imaging of the right tibia and fibula was performed according to the standard protocol.  COMPARISON:  None.  CONTRAST:  168mL OMNIPAQUE IOHEXOL 300 MG/ML  SOLN  FINDINGS: No acute fracture or dislocation. No lytic or sclerotic osseous lesion. No periosteal reaction or bone destruction. Tricompartmental osteoarthritis of the right knee most severe in the medial femorotibial compartment joint space narrowing and marginal osteophytosis. Mild osteoarthritis of the subtalar joints. Mild osteoarthritis of the talonavicular joint.  There is circumferential edema within the subcutaneous fat throughout the right distal femur, lower leg and ankle with overlying  skin thickening. There is no soft tissue emphysema. There is no radiopaque foreign body.  A small area of the medial aspect of the left lower extremity is visualized demonstrating soft tissue edema and skin thickening. There is no focal drainable fluid collection. The muscles enhance normally and homogeneously. There is no intramuscular fluid collection or hematoma.  IMPRESSION: 1. Circumferential edema within the subcutaneous fat throughout the right distal femur, lower leg and ankle with overlying skin thickening. Differential considerations include anasarca versus cellulitis. No drainable fluid collection to suggest an abscess. No soft tissue emphysema.   Electronically Signed   By: Kathreen Devoid   On: 01/23/2015 20:59    EKG:   Orders placed or performed in visit on 09/28/13  . EKG 12-Lead    IMPRESSION AND PLAN:   59 year old female history of bilateral lymphedema presenting with cellulitis right lower extremity.  1. Cellulitis right lower extremity, failed outpatient treatment: She is been started on IV vancomycin emergency department will continue these antibiotics, follow culture data, consult wound care to help assist with swelling from lymphedema 2. Essential hypertension: Continue with home dosages of Norvasc and atenolol 3. Obstructive sleep apnea: CPAP therapy at nighttime 4. Venous thromboembolism prophylactic: Heparin subcutaneous    All the records are reviewed and case discussed with ED provider. Management plans discussed with the patient, family and they are in agreement.  CODE STATUS: Full  TOTAL TIME TAKING CARE OF THIS PATIENT: 35 minutes.    [name],  Karenann Cai.D on 01/23/2015 at 9:40 PM  Between 7am to 6pm - Pager - [phone]  After 6pm: House Pager: - [phone]  [facility]  Office  [phone]  CC: Primary care physician; Leotis Shames, MD

## 2015-01-23 NOTE — Progress Notes (Signed)
Pt's blood pressure 86/53. Notified Dr Lavetta Nielsen. Order received to hold norvac

## 2015-01-23 NOTE — ED Notes (Signed)
Pt resting quietly, no distress noted, pt states that she needs to use the bathroom, pt states that she is able to manage on her own, cont to monitor

## 2015-01-24 ENCOUNTER — Inpatient Hospital Stay: Payer: Medicaid Other

## 2015-01-24 DIAGNOSIS — R21 Rash and other nonspecific skin eruption: Secondary | ICD-10-CM | POA: Diagnosis present

## 2015-01-24 DIAGNOSIS — J45909 Unspecified asthma, uncomplicated: Secondary | ICD-10-CM | POA: Diagnosis present

## 2015-01-24 DIAGNOSIS — R52 Pain, unspecified: Secondary | ICD-10-CM | POA: Diagnosis present

## 2015-01-24 DIAGNOSIS — I517 Cardiomegaly: Secondary | ICD-10-CM | POA: Diagnosis present

## 2015-01-24 DIAGNOSIS — L0390 Cellulitis, unspecified: Secondary | ICD-10-CM | POA: Diagnosis present

## 2015-01-24 DIAGNOSIS — Z7951 Long term (current) use of inhaled steroids: Secondary | ICD-10-CM | POA: Diagnosis not present

## 2015-01-24 DIAGNOSIS — Z888 Allergy status to other drugs, medicaments and biological substances status: Secondary | ICD-10-CM | POA: Diagnosis not present

## 2015-01-24 DIAGNOSIS — L03115 Cellulitis of right lower limb: Secondary | ICD-10-CM | POA: Diagnosis present

## 2015-01-24 DIAGNOSIS — Z79899 Other long term (current) drug therapy: Secondary | ICD-10-CM | POA: Diagnosis not present

## 2015-01-24 DIAGNOSIS — G4733 Obstructive sleep apnea (adult) (pediatric): Secondary | ICD-10-CM | POA: Diagnosis present

## 2015-01-24 DIAGNOSIS — I10 Essential (primary) hypertension: Secondary | ICD-10-CM | POA: Diagnosis present

## 2015-01-24 DIAGNOSIS — Z87891 Personal history of nicotine dependence: Secondary | ICD-10-CM | POA: Diagnosis not present

## 2015-01-24 DIAGNOSIS — M1990 Unspecified osteoarthritis, unspecified site: Secondary | ICD-10-CM | POA: Diagnosis present

## 2015-01-24 DIAGNOSIS — I890 Lymphedema, not elsewhere classified: Secondary | ICD-10-CM | POA: Diagnosis present

## 2015-01-24 DIAGNOSIS — Z6841 Body Mass Index (BMI) 40.0 and over, adult: Secondary | ICD-10-CM | POA: Diagnosis not present

## 2015-01-24 DIAGNOSIS — L97819 Non-pressure chronic ulcer of other part of right lower leg with unspecified severity: Secondary | ICD-10-CM | POA: Diagnosis present

## 2015-01-24 MED ORDER — VANCOMYCIN HCL 10 G IV SOLR
1250.0000 mg | Freq: Three times a day (TID) | INTRAVENOUS | Status: DC
  Administered 2015-01-24 – 2015-01-26 (×6): 1250 mg via INTRAVENOUS
  Filled 2015-01-24 (×9): qty 1250

## 2015-01-24 MED ORDER — SODIUM CHLORIDE 0.9 % IJ SOLN: 10.0000 mL | INTRAMUSCULAR | Status: DC | PRN

## 2015-01-24 MED ORDER — VANCOMYCIN HCL 10 G IV SOLR
1250.0000 mg | Freq: Three times a day (TID) | INTRAVENOUS | Status: DC
  Filled 2015-01-24 (×4): qty 1250

## 2015-01-24 MED ORDER — SODIUM CHLORIDE 0.9 % IJ SOLN
10.0000 mL | Freq: Two times a day (BID) | INTRAMUSCULAR | Status: DC
  Administered 2015-01-26 – 2015-01-27 (×3): 10 mL via INTRAVENOUS

## 2015-01-24 MED ORDER — DIPHENHYDRAMINE HCL 25 MG PO CAPS
25.0000 mg | ORAL_CAPSULE | Freq: Four times a day (QID) | ORAL | Status: DC | PRN
  Administered 2015-01-24 – 2015-01-27 (×7): 25 mg via ORAL
  Filled 2015-01-24 (×7): qty 1

## 2015-01-24 NOTE — Progress Notes (Signed)
[name] NAME: [name]    MR#:  [id number]  DATE OF BIRTH:  [birth year]  SUBJECTIVE:  CHIEF COMPLAINT:   Chief Complaint  Patient presents with  . Cellulitis   Patient here with right lower extremity pain, drainage and noted to have an acute right lower extremity cellulitis. Still has significant drainage in the right lower extremity.  REVIEW OF SYSTEMS:    Review of Systems  Constitutional: Negative for fever and chills.  HENT: Negative for congestion and tinnitus.   Eyes: Negative for blurred vision and double vision.  Respiratory: Negative for cough, shortness of breath and wheezing.   Cardiovascular: Positive for leg swelling (chronic Lymphedema b/l. ). Negative for chest pain, orthopnea and PND.  Gastrointestinal: Negative for nausea, vomiting, abdominal pain and diarrhea.  Genitourinary: Negative for dysuria and hematuria.  Skin: Positive for rash (cellulitic rash on RLE.  ).  Neurological: Negative for dizziness, sensory change and focal weakness.  All other systems reviewed and are negative.  Nutrition: Heart Healthy Tolerating Diet: yes   DRUG ALLERGIES:   Allergies  Allergen Reactions  . Ace Inhibitors   . Ibuprofen     VITALS:  Blood pressure 124/64, pulse 87, temperature 98.9 F (37.2 C), temperature source Oral, resp. rate 20, height 5' 3.5" (1.613 m), weight 179.624 kg (396 lb), SpO2 97 %.  PHYSICAL EXAMINATION:   Physical Exam  GENERAL:  59 y.o.-year-old morbidly obese patient lying in the bed with no acute distress.  EYES: Pupils equal, round, reactive to light and accommodation. No scleral icterus. Extraocular muscles intact.  HEENT: Head atraumatic, normocephalic. Oropharynx and nasopharynx clear.  NECK:  Supple, no jugular venous distention. No thyroid enlargement, no tenderness.  LUNGS: Normal breath sounds bilaterally, no wheezing, rales, rhonchi. No use of accessory muscles of  respiration.  CARDIOVASCULAR: S1, S2 RRR. No murmurs, rubs, or gallops.  ABDOMEN: Soft, nontender, nondistended. Bowel sounds present. No organomegaly or mass.  EXTREMITIES: No cyanosis, clubbing, bilateral chronic lymphedema. Yellow drainage on the right lower extremity. Foul-smelling.  NEUROLOGIC: Cranial nerves II through XII are intact. No focal Motor or sensory deficits b/l.   PSYCHIATRIC: The patient is alert and oriented x 3.  SKIN:  Chronic lower extremity lymphedema bilaterally. Right lower extremity cellulitic rash. 2 small indurated ulcers on the right lower leg with some yellow drainage which is foul smelling.    LABORATORY PANEL:   CBC  Recent Labs Lab 01/23/15 1745  WBC 8.0  HGB 11.6*  HCT 36.0  PLT 149*   ------------------------------------------------------------------------------------------------------------------  Chemistries   Recent Labs Lab 01/23/15 1745  NA 136  K 4.1  CL 103  CO2 26  GLUCOSE 88  BUN 12  CREATININE 0.82  CALCIUM 8.0*   ------------------------------------------------------------------------------------------------------------------  Cardiac Enzymes No results for input(s): TROPONINI in the last 168 hours. ------------------------------------------------------------------------------------------------------------------  RADIOLOGY:  Ct Tibia Fibula Right W Contrast  01/23/2015   CLINICAL DATA:  Right leg pain  EXAM: CT OF THE LOWER RIGHT EXTREMITY WITH CONTRAST  TECHNIQUE: Multidetector CT imaging of the right tibia and fibula was performed according to the standard protocol.  COMPARISON:  None.  CONTRAST:  18mL OMNIPAQUE IOHEXOL 300 MG/ML  SOLN  FINDINGS: No acute fracture or dislocation. No lytic or sclerotic osseous lesion. No periosteal reaction or bone destruction. Tricompartmental osteoarthritis of the right knee most severe in the medial femorotibial compartment joint space narrowing and marginal osteophytosis. Mild  osteoarthritis of the subtalar joints. Mild  osteoarthritis of the talonavicular joint.  There is circumferential edema within the subcutaneous fat throughout the right distal femur, lower leg and ankle with overlying skin thickening. There is no soft tissue emphysema. There is no radiopaque foreign body.  A small area of the medial aspect of the left lower extremity is visualized demonstrating soft tissue edema and skin thickening. There is no focal drainable fluid collection. The muscles enhance normally and homogeneously. There is no intramuscular fluid collection or hematoma.  IMPRESSION: 1. Circumferential edema within the subcutaneous fat throughout the right distal femur, lower leg and ankle with overlying skin thickening. Differential considerations include anasarca versus cellulitis. No drainable fluid collection to suggest an abscess. No soft tissue emphysema.   Electronically Signed   By: Kathreen Devoid   On: 01/23/2015 20:59     ASSESSMENT AND PLAN:   59 year old female with morbid obesity, hypertension, obstructive sleep apnea, chronic lymphedema of the lower extremities bilaterally who presented to the hospital with right lower extremity pain and redness and drainage and noted to have a acute cellulitis.  #1 acute right lower extremity cellulitis-this is likely the cause of patient's redness swelling and foul-smelling drainage. -Continue IV vancomycin for now. -Continue local wound care. This is likely secondary to her chronic lymphedema and will take a few weeks to heal.  #2 hypertension-hemodynamically stable. -Continue Norvasc, atenolol.  #3 obstructive sleep apnea-continue CPAP.  #4 chronic lymphedema on lower extremities-continue wound care as per wound team.    All the records are reviewed and case discussed with Care Management/Social Workerr. Management plans discussed with the patient, family and they are in agreement.  CODE STATUS: Full  DVT Prophylaxis: Heparin  subcutaneous  TOTAL TIME TAKING CARE OF THIS PATIENT: 30 minutes.   POSSIBLE D/C IN 2-3 DAYS, DEPENDING ON CLINICAL CONDITION.   [name] M.D on 01/24/2015 at 1:48 PM  Between 7am to 6pm - Pager - [phone]  After 6pm go to [email or web address] - password [email or web address] [place]  Office  [phone]  CC: Primary care physician; Leotis Shames, MD

## 2015-01-24 NOTE — Consult Note (Signed)
WOC wound consult note Reason for Consult: Lymphedema to bilateral lower extremities with cellulitis to right lower extremity.  Seen at [facility].  Wears removable pneumatic compression garments at home.   Wound type:infectious Pressure Ulcer POA: N/A Measurement:Left posterior calf 1 cm x 0.5 cm nonintact with generalized weeping Right lower extremity is erythematous and heavy purulent weeping noted.  Painful to touch and musty odor.   Wound ITG:PQDIYMEB and induration.  Generalized edema to bilateral lower legs from knee down.  Drainage (amount, consistency, odor) Moderate purulent weeping.  Musty odor.  Periwound:Erythema and induration Dressing procedure/placement/frequency:Cleanse bilateral legs with soap and water daily.  Legs are elevated at this time and will wrap in a couple of days when cellulitis is resolving.  Will re evaluate Wednesday.  Will not follow at this time.  Please re-consult if needed.  Domenic Moras RN BSN [place] Pager [phone]

## 2015-01-24 NOTE — Progress Notes (Signed)
Loss of iv site , vancomycin did not infuse pharmacy notified, unable to resite iv , supervisor was unsucessfull in 1 attempt, MD notified and will place PICC line  [facility] notified to place PICC

## 2015-01-24 NOTE — Progress Notes (Signed)
ANTIBIOTIC CONSULT NOTE - INITIAL  Pharmacy Consult for Vancomycin Dosing Indication: Cellulitis   Allergies  Allergen Reactions  . Ace Inhibitors   . Ibuprofen     Patient Measurements: Height: 5' 3.5" (161.3 cm) Weight: (!) 396 lb (179.624 kg) IBW/kg (Calculated) : 53.55 Adjusted Body Weight: 105kg  Vital Signs: Temp: 98.9 F (37.2 C) (07/18 0820) Temp Source: Oral (07/18 0820) BP: 124/64 mmHg (07/18 0820) Pulse Rate: 87 (07/18 0820) Intake/Output from previous day: 07/17 0701 - 07/18 0700 In: 746.7 [I.V.:746.7] Out: 650 [Urine:650] Intake/Output from this shift: Total I/O In: 360 [P.O.:360] Out: 650 [Urine:650]  Labs:  Recent Labs  01/23/15 1745  WBC 8.0  HGB 11.6*  PLT 149*  CREATININE 0.82   Estimated Creatinine Clearance: 121.3 mL/min (by C-G formula based on Cr of 0.82). No results for input(s): VANCOTROUGH, VANCOPEAK, VANCORANDOM, GENTTROUGH, GENTPEAK, GENTRANDOM, TOBRATROUGH, TOBRAPEAK, TOBRARND, AMIKACINPEAK, AMIKACINTROU, AMIKACIN in the last 72 hours.   Microbiology: Recent Results (from the past 720 hour(s))  Blood culture (routine x 2)     Status: None (Preliminary result)   Collection Time: 01/23/15  8:20 PM  Result Value Ref Range Status   Specimen Description BLOOD ARM  Final   Special Requests BOTTLES DRAWN AEROBIC AND ANAEROBIC 6CC  Final   Culture NO GROWTH < 12 HOURS  Final   Report Status PENDING  Incomplete  Blood culture (routine x 2)     Status: None (Preliminary result)   Collection Time: 01/23/15  8:39 PM  Result Value Ref Range Status   Specimen Description BLOOD ASSIST CONTROL  Final   Special Requests BOTTLES DRAWN AEROBIC AND ANAEROBIC 6CC  Final   Culture NO GROWTH < 12 HOURS  Final   Report Status PENDING  Incomplete    Medical History: Past Medical History  Diagnosis Date  . Hypertension   . Enlarged heart   . Arthritis   . Sleep apnea   . Asthma     Medications:  Scheduled:  . amLODipine  2.5 mg Oral Daily   . atenolol  50 mg Oral Daily  . heparin  5,000 Units Subcutaneous 3 times per day  . vancomycin  1,250 mg Intravenous Q8H   Infusions:  . sodium chloride 100 mL/hr at 01/24/15 1429   Assessment: Pharmacy consulted to dose vancomycin for 59 yo obese female being treated for cellulitis.    Goal of Therapy:  Vancomycin trough level 10-15 mcg/ml  Plan:  Will start patient on vancomycin 1250mg  IV Q8hr for goal trough of 10-15. Will obtain trough prior to 0900 dose on 7/19. Will need to watch serum creatinine/creatinine clearance closely as patient is at high risk for accumulation.    Pharmacy will continue to monitor and adjust per consult.    Rexene Edison, PharmD Clinical Pharmacist 01/24/2015,3:25 PM

## 2015-01-24 NOTE — Progress Notes (Signed)
Patient PICC line was bleeding,  Dr. Hardin Negus called. He stated to hold pressure, reinforce the dressing, and monitor. He also stated to change tomorrow not tonight. Will continue to monitor.

## 2015-01-25 LAB — CREATININE, SERUM
CREATININE: 0.88 mg/dL (ref 0.44–1.00)
GFR calc non Af Amer: >60 mL/min (ref >60)

## 2015-01-25 LAB — VANCOMYCIN, TROUGH: Vancomycin Tr: 15 ug/mL (ref 10–20)

## 2015-01-25 MED ORDER — PREDNISONE 20 MG PO TABS
50.0000 mg | ORAL_TABLET | Freq: Every day | ORAL | Status: DC
  Administered 2015-01-26: 50 mg via ORAL
  Filled 2015-01-25: qty 3

## 2015-01-25 NOTE — Progress Notes (Signed)
ANTIBIOTIC CONSULT NOTE - INITIAL  Pharmacy Consult for Vancomycin Dosing Indication: Cellulitis   Allergies  Allergen Reactions  . Ace Inhibitors   . Ibuprofen     Patient Measurements: Height: 5' 3.5" (161.3 cm) Weight: (!) 396 lb (179.624 kg) IBW/kg (Calculated) : 53.55 Adjusted Body Weight: 105kg  Vital Signs: Temp: 98.6 F (37 C) (07/19 0807) Temp Source: Oral (07/19 0807) BP: 123/64 mmHg (07/19 0807) Pulse Rate: 77 (07/19 0807) Intake/Output from previous day: 07/18 0701 - 07/19 0700 In: 360 [P.O.:360] Out: 2150 [Urine:2150] Intake/Output from this shift: Total I/O In: -  Out: 600 [Urine:600]  Labs:  Recent Labs  01/23/15 1745 01/25/15 0450  WBC 8.0  --   HGB 11.6*  --   PLT 149*  --   CREATININE 0.82 0.88   Estimated Creatinine Clearance: 113 mL/min (by C-G formula based on Cr of 0.88). No results for input(s): VANCOTROUGH, VANCOPEAK, VANCORANDOM, GENTTROUGH, GENTPEAK, GENTRANDOM, TOBRATROUGH, TOBRAPEAK, TOBRARND, AMIKACINPEAK, AMIKACINTROU, AMIKACIN in the last 72 hours.   Microbiology: Recent Results (from the past 720 hour(s))  Blood culture (routine x 2)     Status: None (Preliminary result)   Collection Time: 01/23/15  8:20 PM  Result Value Ref Range Status   Specimen Description BLOOD ARM  Final   Special Requests BOTTLES DRAWN AEROBIC AND ANAEROBIC 6CC  Final   Culture NO GROWTH 2 DAYS  Final   Report Status PENDING  Incomplete  Blood culture (routine x 2)     Status: None (Preliminary result)   Collection Time: 01/23/15  8:39 PM  Result Value Ref Range Status   Specimen Description BLOOD ASSIST CONTROL  Final   Special Requests BOTTLES DRAWN AEROBIC AND ANAEROBIC 6CC  Final   Culture NO GROWTH 2 DAYS  Final   Report Status PENDING  Incomplete    Medical History: Past Medical History  Diagnosis Date  . Hypertension   . Enlarged heart   . Arthritis   . Sleep apnea   . Asthma     Medications:  Scheduled:  . amLODipine  2.5 mg  Oral Daily  . atenolol  50 mg Oral Daily  . heparin  5,000 Units Subcutaneous 3 times per day  . [START ON 01/26/2015] predniSONE  50 mg Oral Q breakfast  . sodium chloride  10 mL Intravenous Q12H  . vancomycin  1,250 mg Intravenous Q8H   Infusions:  . sodium chloride 100 mL/hr at 01/25/15 0946   Assessment: Pharmacy consulted to dose vancomycin for 59 yo obese female being treated for cellulitis.    Goal of Therapy:  Vancomycin trough level 10-15 mcg/ml  Plan:  Continue vancomycin 1250mg  IV Q8hr for goal trough of 10-15. Will obtain trough prior to 1800 dose on 7/19. Will need to watch serum creatinine/creatinine clearance closely as patient is at high risk for accumulation.    MD notes rash on neck and trunk, will discuss with RN and suggest decreasing infusion rate if rash correlates with vancomycin administration.    Pharmacy will continue to monitor and adjust per consult.    Rexene Edison, PharmD Clinical Pharmacist 01/25/2015,12:00 PM

## 2015-01-25 NOTE — Progress Notes (Signed)
[facility] [place] NAME: [name]    MR#:  [id number]  DATE OF BIRTH:  1956-02-06  SUBJECTIVE:  CHIEF COMPLAINT:   Chief Complaint  Patient presents with  . Cellulitis   Patient here with right lower extremity pain, drainage and noted to have an acute right lower extremity cellulitis. Still has some drainage on the right leg. She now has a macular itching rash on her back trunk and neck area.  REVIEW OF SYSTEMS:    Review of Systems  Constitutional: Negative for fever and chills.  HENT: Negative for congestion and tinnitus.   Eyes: Negative for blurred vision and double vision.  Respiratory: Negative for cough, shortness of breath and wheezing.   Cardiovascular: Positive for leg swelling (chronic Lymphedema b/l. ). Negative for chest pain, orthopnea and PND.  Gastrointestinal: Negative for nausea, vomiting, abdominal pain and diarrhea.  Genitourinary: Negative for dysuria and hematuria.  Skin: Positive for rash (cellulitic rash on RLE, maacular rash on Neck, Back, Chest area. ).  Neurological: Negative for dizziness, sensory change and focal weakness.  All other systems reviewed and are negative.  Nutrition: Heart Healthy Tolerating Diet: yes  DRUG ALLERGIES:   Allergies  Allergen Reactions  . Ace Inhibitors   . Ibuprofen     VITALS:  Blood pressure 123/64, pulse 77, temperature 98.6 F (37 C), temperature source Oral, resp. rate 18, height 5' 3.5" (1.613 m), weight 179.624 kg (396 lb), SpO2 99 %.  PHYSICAL EXAMINATION:   Physical Exam  GENERAL:  59 y.o.-year-old morbidly obese patient lying in the bed with no acute distress.  EYES: Pupils equal, round, reactive to light and accommodation. No scleral icterus. Extraocular muscles intact.  HEENT: Head atraumatic, normocephalic. Oropharynx and nasopharynx clear.  NECK:  Supple, no jugular venous distention. No thyroid enlargement, no tenderness.  LUNGS: Normal  breath sounds bilaterally, no wheezing, rales, rhonchi. No use of accessory muscles of respiration.  CARDIOVASCULAR: S1, S2 RRR. No murmurs, rubs, or gallops.  ABDOMEN: Soft, nontender, nondistended. Bowel sounds present. No organomegaly or mass.  EXTREMITIES: No cyanosis, clubbing, +2 bilateral chronic lymphedema . Yellow drainage on the right lower extremity. Foul-smelling.  NEUROLOGIC: Cranial nerves II through XII are intact. No focal Motor or sensory deficits b/l.   PSYCHIATRIC: The patient is alert and oriented x 3. Good affect SKIN:  Chronic lower extremity lymphedema bilaterally. Right lower extremity cellulitic rash. 2 small indurated ulcers on the right lower leg with some yellow drainage which is foul smelling.  -Patient has a macular, blanching rash on her back, neck and chest area   LABORATORY PANEL:   CBC  Recent Labs Lab 01/23/15 1745  WBC 8.0  HGB 11.6*  HCT 36.0  PLT 149*   ------------------------------------------------------------------------------------------------------------------  Chemistries   Recent Labs Lab 01/23/15 1745 01/25/15 0450  NA 136  --   K 4.1  --   CL 103  --   CO2 26  --   GLUCOSE 88  --   BUN 12  --   CREATININE 0.82 0.88  CALCIUM 8.0*  --    ------------------------------------------------------------------------------------------------------------------  Cardiac Enzymes No results for input(s): TROPONINI in the last 168 hours. ------------------------------------------------------------------------------------------------------------------  RADIOLOGY:  Ct Tibia Fibula Right W Contrast  01/23/2015   CLINICAL DATA:  Right leg pain  EXAM: CT OF THE LOWER RIGHT EXTREMITY WITH CONTRAST  TECHNIQUE: Multidetector CT imaging of the right tibia and fibula was performed according to the standard protocol.  COMPARISON:  None.  CONTRAST:  177mL OMNIPAQUE IOHEXOL 300 MG/ML  SOLN  FINDINGS: No acute fracture or dislocation. No lytic or  sclerotic osseous lesion. No periosteal reaction or bone destruction. Tricompartmental osteoarthritis of the right knee most severe in the medial femorotibial compartment joint space narrowing and marginal osteophytosis. Mild osteoarthritis of the subtalar joints. Mild osteoarthritis of the talonavicular joint.  There is circumferential edema within the subcutaneous fat throughout the right distal femur, lower leg and ankle with overlying skin thickening. There is no soft tissue emphysema. There is no radiopaque foreign body.  A small area of the medial aspect of the left lower extremity is visualized demonstrating soft tissue edema and skin thickening. There is no focal drainable fluid collection. The muscles enhance normally and homogeneously. There is no intramuscular fluid collection or hematoma.  IMPRESSION: 1. Circumferential edema within the subcutaneous fat throughout the right distal femur, lower leg and ankle with overlying skin thickening. Differential considerations include anasarca versus cellulitis. No drainable fluid collection to suggest an abscess. No soft tissue emphysema.   Electronically Signed   By: Kathreen Devoid   On: 01/23/2015 20:59   Dg Chest Port 1 View  01/24/2015   CLINICAL DATA:  PICC line placement  EXAM: PORTABLE CHEST - 1 VIEW  COMPARISON:  Portable exam 1906 hours without priors for comparison  FINDINGS: RIGHT arm PICC line tip projects over mid SVC.  Enlargement of cardiac silhouette with pulmonary vascular congestion.  Mild tortuosity of thoracic aorta.  No gross infiltrate, pleural effusion or pneumothorax.  Bones unremarkable.  IMPRESSION: Tip of RIGHT arm PICC line projects over mid SVC.  Enlargement of cardiac silhouette with pulmonary vascular congestion.   Electronically Signed   By: Lavonia Dana M.D.   On: 01/24/2015 19:21     ASSESSMENT AND PLAN:   59 year old female with morbid obesity, hypertension, obstructive sleep apnea, chronic lymphedema of the lower  extremities bilaterally who presented to the hospital with right lower extremity pain and redness and drainage and noted to have a acute cellulitis.  #1 acute right lower extremity cellulitis-this is likely the cause of patient's redness swelling and foul-smelling drainage. -Continue IV vancomycin for now. -Continue local wound care. This is likely secondary to her chronic lymphedema and will take a few weeks to heal.  #2 hypertension-hemodynamically stable. -Continue Norvasc, atenolol.  #3 obstructive sleep apnea-continue CPAP.  #4 chronic lymphedema on lower extremities-continue wound care as per wound team.  #5 rash-etiology unclear. ?? Drug rash although unlikely. -Continue supportive care with Benadryl when necessary. We'll start some prednisone today and follow.   All the records are reviewed and case discussed with Care Management/Social Workerr. Management plans discussed with the patient, family and they are in agreement.  CODE STATUS: Full  DVT Prophylaxis: Heparin subcutaneous  TOTAL TIME TAKING CARE OF THIS PATIENT: 30 minutes.   POSSIBLE D/C IN 2-3 DAYS, DEPENDING ON CLINICAL CONDITION.   [name] M.D on 01/25/2015 at 10:05 AM  Between 7am to 6pm - Pager - [phone]  After 6pm go to [email or web address] - password [email or web address] [facility]  Office  [phone]  CC: Primary care physician; Leotis Shames, MD

## 2015-01-25 NOTE — Progress Notes (Signed)
ANTIBIOTIC CONSULT NOTE - FOLLOW UP  Pharmacy Consult for Vancomycin Indication: Cellulitis  Allergies  Allergen Reactions  . Ace Inhibitors   . Ibuprofen     Patient Measurements: Height: 5' 3.5" (161.3 cm) Weight: (!) 396 lb (179.624 kg) IBW/kg (Calculated) : 53.55   Vital Signs: Temp: 98.5 F (36.9 C) (07/19 1525) Temp Source: Oral (07/19 1525) BP: 134/63 mmHg (07/19 1525) Pulse Rate: 77 (07/19 1525) Intake/Output from previous day: 07/18 0701 - 07/19 0700 In: 360 [P.O.:360] Out: 2150 [Urine:2150] Intake/Output from this shift: Total I/O In: -  Out: 300 [Urine:300]  Labs:  Recent Labs  01/23/15 1745 01/25/15 0450  WBC 8.0  --   HGB 11.6*  --   PLT 149*  --   CREATININE 0.82 0.88   Estimated Creatinine Clearance: 113 mL/min (by C-G formula based on Cr of 0.88).  Recent Labs  01/25/15 1856  [place] 15     Microbiology: Recent Results (from the past 720 hour(s))  Blood culture (routine x 2)     Status: None (Preliminary result)   Collection Time: 01/23/15  8:20 PM  Result Value Ref Range Status   Specimen Description BLOOD ARM  Final   Special Requests BOTTLES DRAWN AEROBIC AND ANAEROBIC 6CC  Final   Culture NO GROWTH 2 DAYS  Final   Report Status PENDING  Incomplete  Blood culture (routine x 2)     Status: None (Preliminary result)   Collection Time: 01/23/15  8:39 PM  Result Value Ref Range Status   Specimen Description BLOOD ASSIST CONTROL  Final   Special Requests BOTTLES DRAWN AEROBIC AND ANAEROBIC 6CC  Final   Culture NO GROWTH 2 DAYS  Final   Report Status PENDING  Incomplete    Anti-infectives    Start     Dose/Rate Route Frequency Ordered Stop   01/24/15 1800  vancomycin (VANCOCIN) 1,250 mg in sodium chloride 0.9 % 250 mL IVPB     1,250 mg 166.7 mL/hr over 90 Minutes Intravenous Every 8 hours 01/24/15 1605     01/24/15 0500  vancomycin (VANCOCIN) 1,250 mg in sodium chloride 0.9 % 250 mL IVPB  Status:  Discontinued     1,250  mg 166.7 mL/hr over 90 Minutes Intravenous Every 8 hours 01/24/15 0517 01/24/15 1605   01/24/15 0100  vancomycin (VANCOCIN) 1,250 mg in sodium chloride 0.9 % 250 mL IVPB  Status:  Discontinued     1,250 mg 166.7 mL/hr over 90 Minutes Intravenous Every 8 hours 01/23/15 2154 01/24/15 0517   01/23/15 1930  vancomycin (VANCOCIN) IVPB 1000 mg/200 mL premix     1,000 mg 200 mL/hr over 60 Minutes Intravenous  Once 01/23/15 1926 01/23/15 2151      Assessment: 59 y/o F on vancomycin for cellulitis with vancomycin trough at goal.   Goal of Therapy:  Vancomycin trough level 10-15 mcg/ml  Plan:  Will continue vancomycin 1250 mg iv q 8 hours and continue to follow renal function as patient is at high risk for accumulation.   Ulice Dash D 01/25/2015,8:01 PM

## 2015-01-26 MED ORDER — CEFAZOLIN SODIUM 1-5 GM-% IV SOLN
1.0000 g | Freq: Three times a day (TID) | INTRAVENOUS | Status: DC
  Administered 2015-01-26 – 2015-01-28 (×6): 1 g via INTRAVENOUS
  Filled 2015-01-26 (×10): qty 50

## 2015-01-26 MED ORDER — HYDROXYZINE HCL 25 MG PO TABS: 25.0000 mg | ORAL_TABLET | Freq: Four times a day (QID) | ORAL | Status: DC | PRN

## 2015-01-26 NOTE — Consult Note (Signed)
WOC wound follow up Wound type:Cellulitis to bilateral lower legs, resolving.  Application of compression today (Unnas boots) for management of lymphedema.  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  She foresees that she may not be able to continue caring for her mother.  Measurement:NOne  Generalized erythema from knee down, resolving.  Tender to touch right posterior calf and left dorsal calf.  Wound bed:Intact, erythema with purulent weeping Drainage (amount, consistency, odor) Minimal purulent weeping Periwound:Erythema, induration and generalized edema from knee down.  Dressing procedure/placement/frequency:Cleanse bilateral legs with soap and water and pat gently dry.  Apply Aquacel Ag to right posterior and anterior calf for absorption.  Apply zinc layer, followed by kerlix and then secured with Coban.  DO not wrap feet in wrap, per patient preference. This impedes her ambulation. Change twice weekly.  Home with [facility] for ongoing compression needs.  Will not follow at this time.  Please re-consult if needed.  Domenic Moras RN BSN [place] Pager [phone]

## 2015-01-26 NOTE — Plan of Care (Signed)
Problem: Discharge Progression Outcomes Goal: Wound improving/decreased edema Outcome: Progressing Patient seen by wound care nurse today and bilateral lower ext wrapped in unna boots.  VSS, continues to have widespread rash on body, MD believes it coming from either clindamycin that she was taking at home but also switched her vacomycin to something different in case it was contributing as well.  Started on po steroids for rash and prn benadryl giving for itch.  Patient remains incontinent at times she can get up with minimal assist to bath room using cane. Remains alert and oriented and able to follow commands, Has PICC line in right upper arm, flushing and infusing well.  Swallows pills fine, tolerating diet well.

## 2015-01-26 NOTE — Progress Notes (Signed)
ANTIBIOTIC CONSULT NOTE  Pharmacy Consult for Cefazolin  Indication: cellulitis   Allergies  Allergen Reactions  . Ace Inhibitors   . Ibuprofen     Patient Measurements: Height: 5' 3.5" (161.3 cm) Weight: (!) 396 lb (179.624 kg) IBW/kg (Calculated) : 53.55  Vital Signs: Temp: 99.2 F (37.3 C) (07/20 0741) Temp Source: Oral (07/20 0741) BP: 127/68 mmHg (07/20 0741) Pulse Rate: 92 (07/20 0741) Intake/Output from previous day: 07/19 0701 - 07/20 0700 In: 2341.3 [P.O.:480; I.V.:1611.3; IV Piggyback:250] Out: 1600 [Urine:1600] Intake/Output from this shift: Total I/O In: 10 [I.V.:10] Out: -   Labs:  Recent Labs  01/23/15 1745 01/25/15 0450  WBC 8.0  --   HGB 11.6*  --   PLT 149*  --   CREATININE 0.82 0.88   Estimated Creatinine Clearance: 113 mL/min (by C-G formula based on Cr of 0.88).  Recent Labs  01/25/15 1856  [place] 15     Microbiology: Recent Results (from the past 720 hour(s))  Blood culture (routine x 2)     Status: None (Preliminary result)   Collection Time: 01/23/15  8:20 PM  Result Value Ref Range Status   Specimen Description BLOOD ARM  Final   Special Requests BOTTLES DRAWN AEROBIC AND ANAEROBIC 6CC  Final   Culture NO GROWTH 2 DAYS  Final   Report Status PENDING  Incomplete  Blood culture (routine x 2)     Status: None (Preliminary result)   Collection Time: 01/23/15  8:39 PM  Result Value Ref Range Status   Specimen Description BLOOD ASSIST CONTROL  Final   Special Requests BOTTLES DRAWN AEROBIC AND ANAEROBIC 6CC  Final   Culture NO GROWTH 2 DAYS  Final   Report Status PENDING  Incomplete    Medical History: Past Medical History  Diagnosis Date  . Hypertension   . Enlarged heart   . Arthritis   . Sleep apnea   . Asthma     Medications:  Anti-infectives    Start     Dose/Rate Route Frequency Ordered Stop   01/26/15 1400  ceFAZolin (ANCEF) IVPB 1 g/50 mL premix     1 g 100 mL/hr over 30 Minutes Intravenous 3 times per  day 01/26/15 1152     01/24/15 1800  vancomycin (VANCOCIN) 1,250 mg in sodium chloride 0.9 % 250 mL IVPB  Status:  Discontinued     1,250 mg 166.7 mL/hr over 90 Minutes Intravenous Every 8 hours 01/24/15 1605 01/26/15 1126   01/24/15 0500  vancomycin (VANCOCIN) 1,250 mg in sodium chloride 0.9 % 250 mL IVPB  Status:  Discontinued     1,250 mg 166.7 mL/hr over 90 Minutes Intravenous Every 8 hours 01/24/15 0517 01/24/15 1605   01/24/15 0100  vancomycin (VANCOCIN) 1,250 mg in sodium chloride 0.9 % 250 mL IVPB  Status:  Discontinued     1,250 mg 166.7 mL/hr over 90 Minutes Intravenous Every 8 hours 01/23/15 2154 01/24/15 0517   01/23/15 1930  vancomycin (VANCOCIN) IVPB 1000 mg/200 mL premix     1,000 mg 200 mL/hr over 60 Minutes Intravenous  Once 01/23/15 1926 01/23/15 2151     Assessment: Patient on vancomycin x 4 days for cellulitis, changing to cefazolin. Pharmacy consulted to dose. Blood cultures with no growth.   Plan:  Ordered cefazolin 1gm IV Q8H, which is appropriate for renal function and indication. Will continue to follow per consult  Rexene Edison, PharmD Clinical Pharmacist 01/26/2015,11:52 AM

## 2015-01-26 NOTE — Care Management Note (Signed)
Case Management Note  Patient Details  Name: Nilam Quakenbush MRN: [id number] Date of Birth: 1955/07/15  Subjective/Objective:                  Met with patient to discuss discharge planning. She plans to return home with her 2-sisters and her elderly mother. She will need Aquacell AG then [facility] boot to bilateral lower extremities starting Friday and then twice weekly per [name] Glad RN with wound care. List of home health agencies provided.Her PCP is with [facility] She uses [facility] on [address].   Action/Plan:  Patient picked [place]. Referral made to [facility] with [place] for RN and PT.  Expected Discharge Date:                  Expected Discharge Plan:     In-House Referral:     Discharge planning Services  CM Consult  Post Acute Care Choice:    Choice offered to:  Patient  DME Arranged:    DME Agency:     HH Arranged:  RN, PT [place] Agency:  [place]  Status of Service:  In process, will continue to follow  Medicare Important Message Given:    Date Medicare IM Given:    Medicare IM give by:    Date Additional Medicare IM Given:    Additional Medicare Important Message give by:     If discussed at [place] of Stay Meetings, dates discussed:    Additional Comments:  Marshell Garfinkel, RN 01/26/2015, 11:04 AM

## 2015-01-26 NOTE — Progress Notes (Signed)
[name] at [place] NAME: [name]    MR#:  [id number]  DATE OF BIRTH:  [birth year]  SUBJECTIVE:  CHIEF COMPLAINT:   Chief Complaint  Patient presents with  . Cellulitis   Patient here with right lower extremity pain, drainage and noted to have an acute right lower extremity cellulitis.  Right lower extremity drainage has improved. Lower extremities now wrapped in [employer]. She'll has a macular rash all over her back and trunk.    REVIEW OF SYSTEMS:    Review of Systems  Constitutional: Negative for fever and chills.  HENT: Negative for congestion and tinnitus.   Eyes: Negative for blurred vision and double vision.  Respiratory: Negative for cough, shortness of breath and wheezing.   Cardiovascular: Positive for leg swelling (chronic Lymphedema b/l. ). Negative for chest pain, orthopnea and PND.  Gastrointestinal: Negative for nausea, vomiting, abdominal pain and diarrhea.  Genitourinary: Negative for dysuria and hematuria.  Skin: Positive for rash (cellulitic rash on RLE, maacular rash on Neck, Back, Chest area. ).  Neurological: Negative for dizziness, sensory change and focal weakness.  All other systems reviewed and are negative.  Nutrition: Heart Healthy Tolerating Diet: yes  DRUG ALLERGIES:   Allergies  Allergen Reactions  . Ace Inhibitors   . Ibuprofen     VITALS:  Blood pressure 127/68, pulse 92, temperature 99.2 F (37.3 C), temperature source Oral, resp. rate 18, height 5' 3.5" (1.613 m), weight 179.624 kg (396 lb), SpO2 97 %.  PHYSICAL EXAMINATION:   Physical Exam  GENERAL:  59 y.o.-year-old morbidly obese patient lying in the bed with no acute distress.  EYES: Pupils equal, round, reactive to light and accommodation. No scleral icterus. Extraocular muscles intact.  HEENT: Head atraumatic, normocephalic. Oropharynx and nasopharynx clear.  NECK:  Supple, no jugular venous distention. No thyroid  enlargement, no tenderness.  LUNGS: Normal breath sounds bilaterally, no wheezing, rales, rhonchi. No use of accessory muscles of respiration.  CARDIOVASCULAR: S1, S2 RRR. No murmurs, rubs, or gallops.  ABDOMEN: Soft, nontender, nondistended. Bowel sounds present. No organomegaly or mass.  EXTREMITIES: No cyanosis, clubbing, +2 bilateral chronic lymphedema.  lower extremities wrapped in Unna boots bilaterally  NEUROLOGIC: Cranial nerves II through XII are intact. No focal Motor or sensory deficits b/l.   PSYCHIATRIC: The patient is alert and oriented x 3. Good affect SKIN:  Chronic lower extremity lymphedema bilaterally. Patient has a macular, blanching rash on her back, neck and chest area   LABORATORY PANEL:   CBC  Recent Labs Lab 01/23/15 1745  WBC 8.0  HGB 11.6*  HCT 36.0  PLT 149*   ------------------------------------------------------------------------------------------------------------------  Chemistries   Recent Labs Lab 01/23/15 1745 01/25/15 0450  NA 136  --   K 4.1  --   CL 103  --   CO2 26  --   GLUCOSE 88  --   BUN 12  --   CREATININE 0.82 0.88  CALCIUM 8.0*  --    ------------------------------------------------------------------------------------------------------------------  Cardiac Enzymes No results for input(s): TROPONINI in the last 168 hours. ------------------------------------------------------------------------------------------------------------------  RADIOLOGY:  Dg Chest Port 1 View  01/24/2015   CLINICAL DATA:  PICC line placement  EXAM: PORTABLE CHEST - 1 VIEW  COMPARISON:  Portable exam 1906 hours without priors for comparison  FINDINGS: RIGHT arm PICC line tip projects over mid SVC.  Enlargement of cardiac silhouette with pulmonary vascular congestion.  Mild tortuosity of thoracic aorta.  No gross infiltrate, pleural effusion  or pneumothorax.  Bones unremarkable.  IMPRESSION: Tip of RIGHT arm PICC line projects over mid SVC.   Enlargement of cardiac silhouette with pulmonary vascular congestion.   Electronically Signed   By: Lavonia Dana M.D.   On: 01/24/2015 19:21     ASSESSMENT AND PLAN:   59 year old female with morbid obesity, hypertension, obstructive sleep apnea, chronic lymphedema of the lower extremities bilaterally who presented to the hospital with right lower extremity pain and redness and drainage and noted to have a acute cellulitis.  #1 acute right lower extremity cellulitis-this is likely the cause of patient's redness swelling and foul-smelling drainage. -Clinically improved and now lower extremities are wrapped in [employer]. -Patient has a macular rash possible drug rash are related to vancomycin and therefore will DC vancomycin and start on IV Ancef. -Continue local wound care. This is likely secondary to her chronic lymphedema and will take a few weeks to heal.  #2 hypertension-hemodynamically stable. -Continue Norvasc, atenolol.  #3 obstructive sleep apnea-continue CPAP.  #4 chronic lymphedema on lower extremities-appreciate  wound consult and lower extremities now wrapped in Unna boot.  #5 rash-possible drug rash. We'll DC vancomycin. Start on IV Ancef. -Continue supportive care with Benadryl when necessary.  -Continue prednisone. No evidence of anaphylaxis.   All the records are reviewed and case discussed with Care Management/Social Workerr. Management plans discussed with the patient, family and they are in agreement.  CODE STATUS: Full  DVT Prophylaxis: Heparin subcutaneous  TOTAL TIME TAKING CARE OF THIS PATIENT: 25 minutes.   POSSIBLE D/C tomorrow a.m. If doing well.    [name] M.D on 01/26/2015 at 12:24 PM  Between 7am to 6pm - Pager - [phone]  After 6pm go to [email or web address] - password [email or web address] [facility]  Office  [phone]  CC: Primary care physician; Leotis Shames, MD

## 2015-01-27 LAB — PLATELET COUNT: Platelets: 206 10*3/uL (ref 150–440)

## 2015-01-27 MED ORDER — PNEUMOCOCCAL VAC POLYVALENT 25 MCG/0.5ML IJ INJ
0.5000 mL | INJECTION | INTRAMUSCULAR | Status: AC
  Administered 2015-01-28: 0.5 mL via INTRAMUSCULAR
  Filled 2015-01-27: qty 0.5

## 2015-01-27 MED ORDER — METHYLPREDNISOLONE SODIUM SUCC 125 MG IJ SOLR
125.0000 mg | Freq: Once | INTRAMUSCULAR | Status: AC
  Administered 2015-01-27: 125 mg via INTRAVENOUS
  Filled 2015-01-27: qty 2

## 2015-01-27 MED ORDER — NYSTATIN 100000 UNIT/GM EX OINT
TOPICAL_OINTMENT | Freq: Two times a day (BID) | CUTANEOUS | Status: DC
  Administered 2015-01-27 (×2): via TOPICAL
  Filled 2015-01-27 (×2): qty 30
  Filled 2015-01-27: qty 15

## 2015-01-27 NOTE — Progress Notes (Signed)
Patient very cooperate and pleasant during care.  Legs weaping, more so right lower leg.  Wound Care consulting and pending supplies before she reapplies unnas boot and wrapping.  Nystatin cream applied to entire rash area and patient states with combination of that and benadryl does feel better.  Skin still shows rash but not as red as before cream.  Plan to discharge tomorrow.

## 2015-01-27 NOTE — Progress Notes (Signed)
Patient requested wrapping to be removed on right lower leg since coban was completely saturated and was holding moisture underneath.  Patient stated that wound nurse advised her to do so.  Assisted in removal. All supplies for tomorrows wrapping placed in patients med bin.  Placed leg on moisture absorbent chucks.

## 2015-01-27 NOTE — Progress Notes (Signed)
[facility] at [place] NAME: [name]    MR#:  [id number]  DATE OF BIRTH:  1955/12/19  SUBJECTIVE:  CHIEF COMPLAINT:   Chief Complaint  Patient presents with  . Cellulitis   Patient here with right lower extremity pain, drainage and noted to have an acute right lower extremity cellulitis.  Patient has a macular rash throughout the body sparing the neck and face and some lower extremity. It seems allergic/fungal in nature. Lower extremities wrapped in Unna boots and are doing well.  REVIEW OF SYSTEMS:    Review of Systems  Constitutional: Negative for fever and chills.  HENT: Negative for congestion and tinnitus.   Eyes: Negative for blurred vision and double vision.  Respiratory: Negative for cough, shortness of breath and wheezing.   Cardiovascular: Positive for leg swelling (chronic Lymphedema b/l. ). Negative for chest pain, orthopnea and PND.  Gastrointestinal: Negative for nausea, vomiting, abdominal pain and diarrhea.  Genitourinary: Negative for dysuria and hematuria.  Skin: Positive for rash (maacular rash on Back, Chest, left upper arm, axillary area. ).  Neurological: Negative for dizziness, sensory change and focal weakness.  All other systems reviewed and are negative.  Nutrition: Heart Healthy Tolerating Diet: yes  DRUG ALLERGIES:   Allergies  Allergen Reactions  . Ace Inhibitors   . Ibuprofen     VITALS:  Blood pressure 128/75, pulse 68, temperature 98.4 F (36.9 C), temperature source Oral, resp. rate 18, height 5' 3.5" (1.613 m), weight 179.624 kg (396 lb), SpO2 95 %.  PHYSICAL EXAMINATION:   Physical Exam  GENERAL:  59 y.o.-year-old morbidly obese patient lying in the bed with no acute distress.  EYES: Pupils equal, round, reactive to light and accommodation. No scleral icterus. Extraocular muscles intact.  HEENT: Head atraumatic, normocephalic. Oropharynx and nasopharynx clear.  NECK:  Supple, no  jugular venous distention. No thyroid enlargement, no tenderness.  LUNGS: Normal breath sounds bilaterally, no wheezing, rales, rhonchi. No use of accessory muscles of respiration.  CARDIOVASCULAR: S1, S2 RRR. No murmurs, rubs, or gallops.  ABDOMEN: Soft, nontender, nondistended. Bowel sounds present. No organomegaly or mass.  EXTREMITIES: No cyanosis, clubbing, +2 bilateral chronic lymphedema.  lower extremities wrapped in Unna boots bilaterally  NEUROLOGIC: Cranial nerves II through XII are intact. No focal Motor or sensory deficits b/l.   PSYCHIATRIC: The patient is alert and oriented x 3. Good affect SKIN:  Chronic lower extremity lymphedema bilaterally. Patient has a macular, blanching rash on her back, chest, b/l upper arm and axillary area, buttocks b/l.  Itchy and warm to touch.    LABORATORY PANEL:   CBC  Recent Labs Lab 01/23/15 1745 01/27/15 0653  WBC 8.0  --   HGB 11.6*  --   HCT 36.0  --   PLT 149* 206   ------------------------------------------------------------------------------------------------------------------  Chemistries   Recent Labs Lab 01/23/15 1745 01/25/15 0450  NA 136  --   K 4.1  --   CL 103  --   CO2 26  --   GLUCOSE 88  --   BUN 12  --   CREATININE 0.82 0.88  CALCIUM 8.0*  --    ------------------------------------------------------------------------------------------------------------------  Cardiac Enzymes No results for input(s): TROPONINI in the last 168 hours. ------------------------------------------------------------------------------------------------------------------  RADIOLOGY:  No results found.   ASSESSMENT AND PLAN:   59 year old female with morbid obesity, hypertension, obstructive sleep apnea, chronic lymphedema of the lower extremities bilaterally who presented to the hospital with right lower extremity pain  and redness and drainage and noted to have a acute cellulitis.  #1 acute right lower extremity  cellulitis-this is likely the cause of patient's redness swelling and foul-smelling drainage. -Clinically  Much improved and now lower extremities are wrapped in Unna boot. -cont. Ancef for now while in hospital and will switch to Oral Bactrim upon discharge.  -Continue local wound care as per wound team.   #2 hypertension-hemodynamically stable. -Continue Norvasc, atenolol.  #3 obstructive sleep apnea-continue CPAP.  #4 chronic lymphedema on lower extremities-appreciate  wound consult and lower extremities now wrapped in Unna boot. Cont. Local wound care and dressing changes as per wound team.   #5 rash-etiology unclear. Suspected to be a drug rash and therefore vancomycin was changed to Ancef yesterday. Although the rash has not improved. It is possible that this could be a fungal rash. -We'll give 1 dose of IV Solu-Medrol, DC maintenance prednisone. Apply nystatin ointment twice daily to the affected area and follow clinically. -Continue supportive care with Benadryl when necessary.  - No evidence of anaphylaxis.   All the records are reviewed and case discussed with Care Management/Social Workerr. Management plans discussed with the patient, family and they are in agreement.  CODE STATUS: Full  DVT Prophylaxis: Heparin subcutaneous  TOTAL TIME TAKING CARE OF THIS PATIENT: 30 minutes.   POSSIBLE D/C tomorrow a.m. if rash is improved and clinically feels better.  [name] M.D on 01/27/2015 at 9:13 AM  Between 7am to 6pm - Pager - [phone]  After 6pm go to [email or web address] - password [email or web address] [facility]  Office  [phone]  CC: Primary care physician; Leotis Shames, MD

## 2015-01-27 NOTE — Consult Note (Signed)
WOC wound follow up Wound type: Unnas boots.  Applied yesterday and have leaked through today. Will reapply.  Will need a [place] visit Saturday to assess for drainage and need to re-apply. Planned discharge tomorrow.  Dressing procedure/placement/frequency:Wound Treatment Associate on unit today will perform reapplication of Unnas boots.  Wash with soap and water.  Apply Aquacel Ag silver hydrofiber to posterior and anterior calf on right leg, left anterior calf where weeping.  Follow with Zinc/calamine layer, kerlix layer and secure with Coban.  Change every other day until drainage decreases.  Has removable compression she can wear at home that will help move some of the excess fluid out and should decrease dressing change frequency. Will not follow at this time.  Please re-consult if needed.  Domenic Moras RN BSN [place] Pager [phone]

## 2015-01-28 ENCOUNTER — Ambulatory Visit: Payer: Medicaid Other | Admitting: Surgery

## 2015-01-28 LAB — CULTURE, BLOOD (ROUTINE X 2)
CULTURE: NO GROWTH
Culture: NO GROWTH

## 2015-01-28 LAB — CBC
HEMATOCRIT: 33.5 % — AB (ref 35.0–47.0)
Hemoglobin: 10.7 g/dL — ABNORMAL LOW (ref 12.0–16.0)
MCH: 25.5 pg — ABNORMAL LOW (ref 26.0–34.0)
MCHC: 31.8 g/dL — AB (ref 32.0–36.0)
MCV: 80.1 fL (ref 80.0–100.0)
PLATELETS: 194 10*3/uL (ref 150–440)
RBC: 4.18 MIL/uL (ref 3.80–5.20)
RDW: 14.7 % — ABNORMAL HIGH (ref 11.5–14.5)
WBC: 9.6 10*3/uL (ref 3.6–11.0)

## 2015-01-28 MED ORDER — NYSTATIN 100000 UNIT/GM EX OINT: TOPICAL_OINTMENT | Freq: Two times a day (BID) | CUTANEOUS | Status: DC

## 2015-01-28 MED ORDER — PREDNISONE 10 MG PO TABS: ORAL_TABLET | ORAL | Status: DC

## 2015-01-28 MED ORDER — SULFAMETHOXAZOLE-TRIMETHOPRIM 800-160 MG PO TABS: 1.0000 | ORAL_TABLET | Freq: Two times a day (BID) | ORAL | Status: DC

## 2015-01-28 NOTE — Discharge Summary (Signed)
[facility] [place] NAME: [name]    MR#:  [id number]  DATE OF BIRTH:  1955/10/08  DATE OF ADMISSION:  01/23/2015 ADMITTING PHYSICIAN: Lytle Butte, MD  DATE OF DISCHARGE: 01/28/2015  2:17 PM  PRIMARY CARE PHYSICIAN: Leotis Shames, MD    ADMISSION DIAGNOSIS:  Pain [R52] Cellulitis of right lower extremity [L03.115]  DISCHARGE DIAGNOSIS:  Principal Problem:   Cellulitis of right leg Active Problems:   Cellulitis Macular Rash - likely allergic  SECONDARY DIAGNOSIS:   Past Medical History  Diagnosis Date  . Hypertension   . Enlarged heart   . Arthritis   . Sleep apnea   . Asthma     HOSPITAL COURSE:   59 year old female with morbid obesity, hypertension, obstructive sleep apnea, chronic lymphedema of the lower extremities bilaterally who presented to the hospital with right lower extremity pain and redness and drainage and noted to have a acute cellulitis.  #1 acute right lower extremity cellulitis-this is likely the cause of patient's redness swelling and foul-smelling drainage. -Initially patient was admitted to the hospital and started on IV vancomycin. Patient has chronic lymphedema and therefore a wound team consult was obtained. After getting aggressive therapy patient's drainage redness and swelling has improved. Her legs have not been wrapped with Unna boots. She is clinically afebrile and hemodynamically stable and her drainage from her right lower extremity is improved. -Patient is not being discharged on oral Bactrim and follow up with the wound clinic. She is also being set up with home health nursing care for her chronic wounds on her lower extremities.  #2 hypertension-patient remained hemodynamically stable. -Continue Norvasc, atenolol.  #3 obstructive sleep apnea-continue CPAP.  #4 chronic lymphedema on lower extremities-patient was seen by the wound team and her legs have not been wrapped in  [employer]. Her dressing was changed at discharge and she is being arranged with home health nursing for further dressing care and will continue follow-up with the wound clinic.  #5 rash-patient did well the macular rash while in the hospital the exact etiology of this is unclear but suspected to be an allergic rash. Patient was given some IV Solu-Medrol and the rash has somewhat improved. At this point patient is being discharged on oral prednisone taper. She is also given nystatin cream to be applied to the affected area twice daily as it's a possibility this could be underlying fungal rash. -Patient was also thought to possibly have red man syndrome from vancomycin and it was discontinued and she was switched over to Ancef but the rash still did not improve.  In the rash continues to be a persistent problem she likely needs to seek outpatient dermatology help.   -Patient had no evidence of anaphylaxis or shortness of breath  DISCHARGE CONDITIONS:   Stable  CONSULTS OBTAINED:  Treatment Team:  Lytle Butte, MD  DRUG ALLERGIES:   Allergies  Allergen Reactions  . Ace Inhibitors   . Ibuprofen     DISCHARGE MEDICATIONS:   Discharge Medication List as of 01/28/2015  1:23 PM    START taking these medications   Details  nystatin ointment (MYCOSTATIN) Apply topically 2 (two) times daily., Starting 01/28/2015, Until Discontinued, Print    predniSONE (DELTASONE) 10 MG tablet Label  & dispense according to the schedule below. 5 Pills PO for 1 day then, 4 Pills PO for 1 day, 3 Pills PO for 1 day, 2 Pills PO for 1 day, 1  Pill PO for 1 days then STOP., Print    sulfamethoxazole-trimethoprim (BACTRIM DS,SEPTRA DS) 800-160 MG per tablet Take 1 tablet by mouth 2 (two) times daily., Starting 01/28/2015, Until Discontinued, Print      CONTINUE these medications which have NOT CHANGED   Details  acetaminophen (TYLENOL) 500 MG tablet Take 500 mg by mouth every 6 (six) hours as needed., Until  Discontinued, Historical Med    albuterol (PROVENTIL HFA;VENTOLIN HFA) 108 (90 BASE) MCG/ACT inhaler Inhale 2 puffs into the lungs every 6 (six) hours as needed for wheezing or shortness of breath., Until Discontinued, Historical Med    amLODipine (NORVASC) 2.5 MG tablet Take 2.5 mg by mouth daily., Until Discontinued, Historical Med    atenolol (TENORMIN) 50 MG tablet Take 50 mg by mouth daily., Until Discontinued, Historical Med    gabapentin (NEURONTIN) 300 MG capsule Take 300 mg by mouth daily as needed., Until Discontinued, Historical Med    mometasone (NASONEX) 50 MCG/ACT nasal spray Place 2 sprays into the nose 2 (two) times daily as needed., Until Discontinued, Historical Med         DISCHARGE INSTRUCTIONS:   DIET:  Cardiac diet  DISCHARGE CONDITION:  Stable  ACTIVITY:  Activity as tolerated  OXYGEN:  Home Oxygen: No.   Oxygen Delivery: room air  DISCHARGE LOCATION:  Home with home health nursing   If you experience worsening of your admission symptoms, develop shortness of breath, life threatening emergency, suicidal or homicidal thoughts you must seek medical attention immediately by calling 911 or calling your MD immediately  if symptoms less severe.  You Must read complete instructions/literature along with all the possible adverse reactions/side effects for all the Medicines you take and that have been prescribed to you. Take any new Medicines after you have completely understood and accpet all the possible adverse reactions/side effects.   Please note  You were cared for by a hospitalist during your hospital stay. If you have any questions about your discharge medications or the care you received while you were in the hospital after you are discharged, you can call the unit and asked to speak with the hospitalist on call if the hospitalist that took care of you is not available. Once you are discharged, your primary care physician will handle any further medical  issues. Please note that NO REFILLS for any discharge medications will be authorized once you are discharged, as it is imperative that you return to your primary care physician (or establish a relationship with a primary care physician if you do not have one) for your aftercare needs so that they can reassess your need for medications and monitor your lab values.     Today   Rash has improved. Drainage in the right lower extremity has improved. Afebrile, hemodynamically stable.  VITAL SIGNS:  Blood pressure 149/82, pulse 63, temperature 98.3 F (36.8 C), temperature source Oral, resp. rate 18, height 5' 3.5" (1.613 m), weight 179.624 kg (396 lb), SpO2 99 %.  I/O:   Intake/Output Summary (Last 24 hours) at 01/28/15 1603 Last data filed at 01/28/15 1200  Gross per 24 hour  Intake    840 ml  Output    300 ml  Net    540 ml    PHYSICAL EXAMINATION:  GENERAL:  59 y.o.-year-old morbidly obese patient lying in the bed with no acute distress.  EYES: Pupils equal, round, reactive to light and accommodation. No scleral icterus. Extraocular muscles intact.  HEENT: Head atraumatic, normocephalic. Oropharynx and  nasopharynx clear.  NECK:  Supple, no jugular venous distention. No thyroid enlargement, no tenderness.  LUNGS: Normal breath sounds bilaterally, no wheezing, rales,rhonchi. No use of accessory muscles of respiration.  CARDIOVASCULAR: S1, S2 normal. No murmurs, rubs, or gallops.  ABDOMEN: Soft, non-tender, non-distended. Bowel sounds present. No organomegaly or mass.  EXTREMITIES: No pedal edema, cyanosis, or clubbing. Bilateral lower extremities wrapped in Unna boots. NEUROLOGIC: Cranial nerves II through XII are intact. No focal motor or sensory defecits b/l.  PSYCHIATRIC: The patient is alert and oriented x 3. Good affect.  SKIN: lesion, or ulcer. Patient has a macular rash on her back chest and axillary area bilaterally. It is warm to touch and itchy.  DATA REVIEW:    CBC  Recent Labs Lab 01/28/15 0645  WBC 9.6  HGB 10.7*  HCT 33.5*  PLT 194    Chemistries   Recent Labs Lab 01/23/15 1745 01/25/15 0450  NA 136  --   K 4.1  --   CL 103  --   CO2 26  --   GLUCOSE 88  --   BUN 12  --   CREATININE 0.82 0.88  CALCIUM 8.0*  --     Cardiac Enzymes No results for input(s): TROPONINI in the last 168 hours.  Microbiology Results  Results for orders placed or performed during the hospital encounter of 01/23/15  Blood culture (routine x 2)     Status: None   Collection Time: 01/23/15  8:20 PM  Result Value Ref Range Status   Specimen Description BLOOD ARM  Final   Special Requests BOTTLES DRAWN AEROBIC AND ANAEROBIC 6CC  Final   Culture NO GROWTH 5 DAYS  Final   Report Status 01/28/2015 FINAL  Final  Blood culture (routine x 2)     Status: None   Collection Time: 01/23/15  8:39 PM  Result Value Ref Range Status   Specimen Description BLOOD ASSIST CONTROL  Final   Special Requests BOTTLES DRAWN AEROBIC AND ANAEROBIC 6CC  Final   Culture NO GROWTH 5 DAYS  Final   Report Status 01/28/2015 FINAL  Final    RADIOLOGY:  No results found.    Management plans discussed with the patient, family and they are in agreement.  CODE STATUS:     Code Status Orders        Start     Ordered   01/23/15 2115  Full code   Continuous     01/23/15 2114      TOTAL TIME TAKING CARE OF THIS PATIENT: 40 minutes.    [name] M.D on 01/28/2015 at 4:03 PM  Between 7am to 6pm - Pager - [phone]  After 6pm go to [email or web address] - password [email or web address] [facility]  Office  [phone]  CC: Primary care physician; Leotis Shames, MD

## 2015-01-28 NOTE — Progress Notes (Signed)
Patient's VSS during this shift. She was seen by the wound nurse and had bandages changed.  Removed picc in right upper arm. Belongings packed. Discharge & medications instructions given.   Sister here to drive her home.

## 2015-01-28 NOTE — Care Management (Signed)
Patient discharging home today followed by [facility]. Tharon Aquas RN with [facility] notified of patient discharge and they will follow up with patient on Sunday. No further RNCM needs. Case closed.

## 2015-01-28 NOTE — Progress Notes (Signed)
ANTIBIOTIC CONSULT NOTE  Pharmacy Consult for Cefazolin  Indication: cellulitis   Allergies  Allergen Reactions  . Ace Inhibitors   . Ibuprofen     Patient Measurements: Height: 5' 3.5" (161.3 cm) Weight: (!) 396 lb (179.624 kg) IBW/kg (Calculated) : 53.55  Vital Signs: Temp: 98.3 F (36.8 C) (07/22 0753) Temp Source: Oral (07/22 0753) BP: 149/82 mmHg (07/22 0753) Pulse Rate: 63 (07/22 0753) Intake/Output from previous day: 07/21 0701 - 07/22 0700 In: 2148.3 [P.O.:960; I.V.:1088.3; IV Piggyback:100] Out: 850 [Urine:850] Intake/Output from this shift: Total I/O In: 240 [P.O.:240] Out: -   Labs:  Recent Labs  01/27/15 0653 01/28/15 0645  WBC  --  9.6  HGB  --  10.7*  PLT 206 194   Estimated Creatinine Clearance: 113 mL/min (by C-G formula based on Cr of 0.88).  Recent Labs  01/25/15 1856  [place] 15     Microbiology: Recent Results (from the past 720 hour(s))  Blood culture (routine x 2)     Status: None (Preliminary result)   Collection Time: 01/23/15  8:20 PM  Result Value Ref Range Status   Specimen Description BLOOD ARM  Final   Special Requests BOTTLES DRAWN AEROBIC AND ANAEROBIC 6CC  Final   Culture NO GROWTH 2 DAYS  Final   Report Status PENDING  Incomplete  Blood culture (routine x 2)     Status: None (Preliminary result)   Collection Time: 01/23/15  8:39 PM  Result Value Ref Range Status   Specimen Description BLOOD ASSIST CONTROL  Final   Special Requests BOTTLES DRAWN AEROBIC AND ANAEROBIC 6CC  Final   Culture NO GROWTH 2 DAYS  Final   Report Status PENDING  Incomplete    Medical History: Past Medical History  Diagnosis Date  . Hypertension   . Enlarged heart   . Arthritis   . Sleep apnea   . Asthma     Medications:  Anti-infectives    Start     Dose/Rate Route Frequency Ordered Stop   01/28/15 0000  sulfamethoxazole-trimethoprim (BACTRIM DS,SEPTRA DS) 800-160 MG per tablet     1 tablet Oral 2 times daily 01/28/15 1052     01/26/15 1400  ceFAZolin (ANCEF) IVPB 1 g/50 mL premix     1 g 100 mL/hr over 30 Minutes Intravenous 3 times per day 01/26/15 1152     01/24/15 1800  vancomycin (VANCOCIN) 1,250 mg in sodium chloride 0.9 % 250 mL IVPB  Status:  Discontinued     1,250 mg 166.7 mL/hr over 90 Minutes Intravenous Every 8 hours 01/24/15 1605 01/26/15 1126   01/24/15 0500  vancomycin (VANCOCIN) 1,250 mg in sodium chloride 0.9 % 250 mL IVPB  Status:  Discontinued     1,250 mg 166.7 mL/hr over 90 Minutes Intravenous Every 8 hours 01/24/15 0517 01/24/15 1605   01/24/15 0100  vancomycin (VANCOCIN) 1,250 mg in sodium chloride 0.9 % 250 mL IVPB  Status:  Discontinued     1,250 mg 166.7 mL/hr over 90 Minutes Intravenous Every 8 hours 01/23/15 2154 01/24/15 0517   01/23/15 1930  vancomycin (VANCOCIN) IVPB 1000 mg/200 mL premix     1,000 mg 200 mL/hr over 60 Minutes Intravenous  Once 01/23/15 1926 01/23/15 2151     Assessment: Patient on vancomycin x 4 days for cellulitis, changed to cefazolin. Now on day 3 of cefazolin.  Blood cultures with no growth.   Plan:  Continue cefazolin 1gm IV Q8H, which is appropriate for renal function and indication. Will continue  to follow per consult  Rexene Edison, PharmD Clinical Pharmacist 01/28/2015,10:53 AM

## 2015-01-28 NOTE — Consult Note (Signed)
WOC wound follow up Wound type:Cellulitis to bilateral lower legs.  Unnas boots re applied today to right leg.  Intact on left leg.   Wound bed: Intact with erythema and generalized edema Drainage (amount, consistency, odor) Heavy serous weeping  No odor.  Periwound:Erythem and edema Dressing procedure/placement/frequency:Right leg cleansed with soap and water. Aquacel Ag to anterior and posterior calf.   Unnas boots applied.  Extra kerlix for absorption.  Secured with [place].  Required 2 rolls per leg.  Will not follow at this time.  Please re-consult if needed.  Domenic Moras RN BSN [place] Pager [phone]

## 2015-02-11 ENCOUNTER — Encounter: Payer: Medicaid Other | Attending: Surgery | Admitting: Surgery

## 2015-02-11 DIAGNOSIS — L03115 Cellulitis of right lower limb: Secondary | ICD-10-CM | POA: Insufficient documentation

## 2015-02-11 DIAGNOSIS — I890 Lymphedema, not elsewhere classified: Secondary | ICD-10-CM | POA: Insufficient documentation

## 2015-02-11 DIAGNOSIS — E11622 Type 2 diabetes mellitus with other skin ulcer: Secondary | ICD-10-CM | POA: Diagnosis not present

## 2015-02-12 NOTE — Progress Notes (Addendum)
TANGI, SHROFF ([id number]) Visit Report for 02/11/2015 Chief Complaint Document Details Patient Name: [name], [name]. Date of Service: 02/11/2015 11:30 AM Medical Record Number: [id number] Patient Account Number: [id number] Date of Birth/Sex: [birth year] (59 y.o. Female) Treating RN: Primary Care Physician: Orlene Erm Other Clinician: Referring Physician: Orlene Erm Treating Physician/Extender: Frann Rider in Treatment: 3 Information Obtained from: Patient Chief Complaint Patient presents to the wound care center for a consult due non healing wound. 59 year old patient was known to the wound clinic from previous visits returns with massive lymphedema of the right lower extremity associated with redness and weeping for about 2 weeks Electronic Signature(s) Signed: 02/11/2015 12:18:39 PM By: Christin Fudge MD, FACS Entered By: Christin Fudge on 02/11/2015 12:18:39 Kraft, [name] ([id number]) -------------------------------------------------------------------------------- HPI Details Patient Name: [name]. Date of Service: 02/11/2015 11:30 AM Medical Record Number: [id number] Patient Account Number: [id number] Date of Birth/Sex: February 02, 1956 (59 y.o. Female) Treating RN: Primary Care Physician: Orlene Erm Other Clinician: Referring Physician: Orlene Erm Treating Physician/Extender: Frann Rider in Treatment: 3 History of Present Illness Location: right lower extremity Quality: Patient reports experiencing a dull pain to affected area(s). Severity: Patient states wound are getting worse. Duration: Patient has had the wound for < 2 weeks prior to presenting for treatment Timing: Pain in wound is Intermittent (comes and goes Context: The wound appeared gradually over time Modifying Factors: Consults to this date include:seen by her PCP and recently put on clindamycin Associated Signs and Symptoms: Patient reports having difficulty  standing for long periods. HPI Description: The patient is a 59 year old female with history of hypertension and a long-standing history of bilateral lower extremity lymphedema (first presented on 4/2) . She has had open ulcers in the past which have always responded to compression therapy. She had briefly been to a lymphedema clinic in the past which helped her at the time. this time around she stopped treatment of her lymphedema pumps approximately 2 weeks ago because of some pain in the knees and then noticed the right leg getting worse. She was seen by her PCP who put her on clindamycin 4 times a day 2 days ago. The patient has seen [facility] and Dr. Delana Meyer had seen her last year where a vascular study including venous and arterial duplex studies were within normal limits. he had recommended compression stockings and lymphedema pumps and the patient has been using this in about 2 weeks ago. She is known to be diabetic but in the past few time she's gone to her primary care doctor her hemoglobin A1c has been normal. 02/11/2015 - after her last visit she took my advice and went to the ER regarding the progressive cellulitis of her right lower extremity and she was admitted between July 17 and 22nd. She received IV antibiotics and then was sent home on a course of steroid-induced and oral antibiotics. She has improved much since then. Electronic Signature(s) Signed: 02/11/2015 12:19:56 PM By: Christin Fudge MD, FACS Entered By: Christin Fudge on 02/11/2015 12:19:55 [name] ([id number]) -------------------------------------------------------------------------------- Physical Exam Details Patient Name: [name], [name] Date of Service: 02/11/2015 11:30 AM Medical Record Number: [id number] Patient Account Number: [id number] Date of Birth/Sex: 1955-07-20 (59 y.o. Female) Treating RN: Primary Care Physician: Orlene Erm Other Clinician: Referring Physician: Orlene Erm Treating Physician/Extender: Frann Rider in Treatment: 3 Constitutional . Pulse regular. Respirations normal and unlabored. Afebrile. . Eyes Nonicteric. Reactive to light. Ears, Nose, Mouth, and Throat Lips, teeth, and gums WNL.Marland Kitchen Moist mucosa without lesions .  Neck supple and nontender. No palpable supraclavicular or cervical adenopathy. Normal sized without goiter. Respiratory WNL. No retractions.. Cardiovascular Pedal Pulses WNL. she has massive stage III lymphedema both lower extremities but the cellulitis has resolved.. Chest Breasts symmetical and no nipple discharge.. Breast tissue WNL, no masses, lumps, or tenderness.. Lymphatic No adneopathy. No adenopathy. No adenopathy. Musculoskeletal Adexa without tenderness or enlargement.. Digits and nails w/o clubbing, cyanosis, infection, petechiae, ischemia, or inflammatory conditions.. Integumentary (Hair, Skin) No suspicious lesions. No crepitus or fluctuance. No peri-wound warmth or erythema. No masses.Marland Kitchen Psychiatric Judgement and insight Intact.. No evidence of depression, anxiety, or agitation.. Notes the cellulitis on her right lower extremity has dissolved and there is minimal open areas which are weeping on the posterior part of her right leg. The left leg is devoid of ulcers but has stage III lymphedema. Electronic Signature(s) Signed: 02/11/2015 12:21:35 PM By: Christin Fudge MD, FACS Entered By: Christin Fudge on 02/11/2015 12:21:34 [name] ([id number]) -------------------------------------------------------------------------------- Physician Orders Details Patient Name: [name]. Date of Service: 02/11/2015 11:30 AM Medical Record Number: [id number] Patient Account Number: [id number] Date of Birth/Sex: 10-02-1955 (59 y.o. Female) Treating RN: Montey Hora Primary Care Physician: Orlene Erm Other Clinician: Referring Physician: Orlene Erm Treating Physician/Extender:  Frann Rider in Treatment: 3 Verbal / Phone Orders: Yes Clinician: Montey Hora Read Back and Verified: Yes Diagnosis Coding Wound Cleansing o Cleanse wound with mild soap and water Skin Barriers/Peri-Wound Care o Barrier cream Primary Wound Dressing o Aquacel Ag - posterior leg on draining areas Secondary Dressing o XtraSorb Dressing Change Frequency o Dressing is to be changed Monday and Thursday. - Monday by [facility]; Thursday in [place]. HHRN may change more often if needed. Follow-up Appointments o Return Appointment in 1 week. Edema Control o Unna Boot to Right Lower Extremity Additional Orders / Instructions o Increase protein intake. o Activity as tolerated [place] Visits - Monday o Home Health Nurse may visit PRN to address patientos wound care needs. o FACE TO FACE ENCOUNTER: MEDICARE and MEDICAID PATIENTS: I certify that this patient is under my care and that I had a face-to-face encounter that meets the physician face-to-face encounter requirements with this patient on this date. The encounter with the patient was in whole or in part for the following MEDICAL CONDITION: (primary reason for [place]) MEDICAL NECESSITY: I certify, that based on my findings, NURSING services are a medically necessary home health service. HOME BOUND STATUS: I certify that my clinical findings support that this patient is homebound (i.e., Due to illness or injury, pt requires aid of supportive devices such as crutches, cane, wheelchairs, walkers, the use of special transportation or the assistance of another person to leave their place of residence. There is a normal inability to leave the home and doing so requires considerable and taxing effort. Other absences are for medical reasons / religious services and are infrequent or of short duration when for other reasons). ELFRIEDE, BONINI ([id number]) o If current  dressing causes regression in wound condition, may D/C ordered dressing product/s and apply Normal Saline Moist Dressing daily until next [place] / Other MD appointment. [place] of regression in wound condition at [phone]. o Please direct any NON-WOUND related issues/requests for orders to patient's Primary Care Physician Electronic Signature(s) Signed: 02/11/2015 12:28:33 PM By: Christin Fudge MD, FACS Signed: 02/11/2015 4:15:34 PM By: Gretta Cool RN, BSN, Kim RN, BSN Entered By: Gretta Cool, RN, BSN, Kim on 02/11/2015  12:25:52 ALANIS, CLIFT ([id number]) -------------------------------------------------------------------------------- Problem List Details Patient Name: [name], [name]. Date of Service: 02/11/2015 11:30 AM Medical Record Number: [id number] Patient Account Number: [id number] Date of Birth/Sex: 1955/11/07 (59 y.o. Female) Treating RN: Primary Care Physician: Orlene Erm Other Clinician: Referring Physician: Orlene Erm Treating Physician/Extender: Frann Rider in Treatment: 3 Active Problems ICD-10 Encounter Code Description Active Date Diagnosis E11.622 Type 2 diabetes mellitus with other skin ulcer 01/21/2015 Yes I89.0 Lymphedema, not elsewhere classified 01/21/2015 Yes E66.01 Morbid (severe) obesity due to excess calories 01/21/2015 Yes L03.115 Cellulitis of right lower limb 01/21/2015 Yes Inactive Problems Resolved Problems Electronic Signature(s) Signed: 02/11/2015 12:18:32 PM By: Christin Fudge MD, FACS Entered By: Christin Fudge on 02/11/2015 12:18:32 Hoe, [name] ([id number]) -------------------------------------------------------------------------------- Progress Note Details Patient Name: [name]. Date of Service: 02/11/2015 11:30 AM Medical Record Number: [id number] Patient Account Number: [id number] Date of Birth/Sex: 22-Sep-1955 (59 y.o. Female) Treating RN: Primary Care Physician: Orlene Erm Other Clinician: Referring Physician: Orlene Erm Treating Physician/Extender: Frann Rider in Treatment: 3 Subjective Chief Complaint Information obtained from Patient Patient presents to the wound care center for a consult due non healing wound. 59 year old patient was known to the wound clinic from previous visits returns with massive lymphedema of the right lower extremity associated with redness and weeping for about 2 weeks History of Present Illness (HPI) The following HPI elements were documented for the patient's wound: Location: right lower extremity Quality: Patient reports experiencing a dull pain to affected area(s). Severity: Patient states wound are getting worse. Duration: Patient has had the wound for < 2 weeks prior to presenting for treatment Timing: Pain in wound is Intermittent (comes and goes Context: The wound appeared gradually over time Modifying Factors: Consults to this date include:seen by her PCP and recently put on clindamycin Associated Signs and Symptoms: Patient reports having difficulty standing for long periods. The patient is a 59 year old female with history of hypertension and a long-standing history of bilateral lower extremity lymphedema (first presented on 4/2) . She has had open ulcers in the past which have always responded to compression therapy. She had briefly been to a lymphedema clinic in the past which helped her at the time. this time around she stopped treatment of her lymphedema pumps approximately 2 weeks ago because of some pain in the knees and then noticed the right leg getting worse. She was seen by her PCP who put her on clindamycin 4 times a day 2 days ago. The patient has seen [facility] and Dr. Delana Meyer had seen her last year where a vascular study including venous and arterial duplex studies were within normal limits. he had recommended compression stockings and lymphedema pumps and the patient has been using  this in about 2 weeks ago. She is known to be diabetic but in the past few time she's gone to her primary care doctor her hemoglobin A1c has been normal. 02/11/2015 - after her last visit she took my advice and went to the ER regarding the progressive cellulitis of her right lower extremity and she was admitted between July 17 and 22nd. She received IV antibiotics and then was sent home on a course of steroid-induced and oral antibiotics. She has improved much since then. KENZLEIGH, SEDAM ([id number]) Objective Constitutional Pulse regular. Respirations normal and unlabored. Afebrile. Vitals Time Taken: 11:40 AM, Height: 63 in, Weight: 396 lbs, BMI: 70.1, Temperature: 98.7 F, Pulse: 72 bpm, Respiratory Rate: 20 breaths/min, Blood Pressure: 147/87 mmHg. Eyes Nonicteric. Reactive to light.  Ears, Nose, Mouth, and Throat Lips, teeth, and gums WNL.Marland Kitchen Moist mucosa without lesions . Neck supple and nontender. No palpable supraclavicular or cervical adenopathy. Normal sized without goiter. Respiratory WNL. No retractions.. Cardiovascular Pedal Pulses WNL. she has massive stage III lymphedema both lower extremities but the cellulitis has resolved.. Chest Breasts symmetical and no nipple discharge.. Breast tissue WNL, no masses, lumps, or tenderness.. Lymphatic No adneopathy. No adenopathy. No adenopathy. Musculoskeletal Adexa without tenderness or enlargement.. Digits and nails w/o clubbing, cyanosis, infection, petechiae, ischemia, or inflammatory conditions.Marland Kitchen Psychiatric Judgement and insight Intact.. No evidence of depression, anxiety, or agitation.. General Notes: the cellulitis on her right lower extremity has dissolved and there is minimal open areas which are weeping on the posterior part of her right leg. The left leg is devoid of ulcers but has stage III lymphedema. Integumentary (Hair, Skin) No suspicious lesions. No crepitus or fluctuance. No peri-wound warmth or erythema. No  masses.. Other Condition(s) SHAVAUN, OSTERLOH ([id number]) Patient presents with Lymphedema located on the Right Leg. The skin appearance exhibited: Maceration, Moist, Mottled. The skin appearance did not exhibit: Atrophie Blanche, Callus, Crepitus, Cyanosis, Dry/Scaly, Ecchymosis, Erythema, Excoriation, Fluctuance, Friable, Hemosiderin Staining, Induration, Localized Edema, Pallor, Rash, Rubor, Scarring. Skin temperature was noted as No Abnormality. General Notes: area of drainage on right lower posterior leg; right lower leg wrapped with aquacel ag over draining area, abd pad, unna wrap, kerlix and coban Assessment Active Problems ICD-10 E11.622 - Type 2 diabetes mellitus with other skin ulcer I89.0 - Lymphedema, not elsewhere classified E66.01 - Morbid (severe) obesity due to excess calories L03.115 - Cellulitis of right lower limb We will continue with silver alginate and a Unna's boot to the right lower extremity. She has no open ulcerations on the left. She has compression pumps for the left lower extremity. she will continue to work with the lymphedema pump at home and I have also asked her to see the lymphedema clinic once her ulceration on the right side has subsided. She has home health coming to change her own Unna's boots twice a week and we did a dressing once a week. Plan Wound Cleansing: Cleanse wound with mild soap and water Skin Barriers/Peri-Wound Care: Barrier cream Primary Wound Dressing: Aquacel Ag - posterior leg on draining areas Secondary Dressing: XtraSorb Dressing Change Frequency: Dressing is to be changed Monday and Thursday. - Monday by [facility]; Thursday in [place]. HHRN may change more often if needed. Follow-up Appointments: Return Appointment in 1 week. MYISHA, PICKEREL ([id number]) Edema Control: Rolena Infante to Right Lower Extremity Additional Orders / Instructions: Increase protein intake. Activity as tolerated Home Health: [place] Visits - Monday Home Health Nurse may visit PRN to address patient s wound care needs. FACE TO FACE ENCOUNTER: MEDICARE and MEDICAID PATIENTS: I certify that this patient is under my care and that I had a face-to-face encounter that meets the physician face-to-face encounter requirements with this patient on this date. The encounter with the patient was in whole or in part for the following MEDICAL CONDITION: (primary reason for [place]) MEDICAL NECESSITY: I certify, that based on my findings, NURSING services are a medically necessary home health service. HOME BOUND STATUS: I certify that my clinical findings support that this patient is homebound (i.e., Due to illness or injury, pt requires aid of supportive devices such as crutches, cane, wheelchairs, walkers, the use of special transportation or the assistance of another person to leave their place of residence. There is a  normal inability to leave the home and doing so requires considerable and taxing effort. Other absences are for medical reasons / religious services and are infrequent or of short duration when for other reasons). If current dressing causes regression in wound condition, may D/C ordered dressing product/s and apply Normal Saline Moist Dressing daily until next [place] / Other MD appointment. [place] of regression in wound condition at [phone]. Please direct any NON-WOUND related issues/requests for orders to patient's Primary Care Physician We will continue with silver alginate and a Unna's boot to the right lower extremity. She has no open ulcerations on the left. She has compression pumps for the left lower extremity. she will continue to work with the lymphedema pump at home and I have also asked her to see the lymphedema clinic once her ulceration on the right side has subsided. She has home health coming to change her own Unna's boots twice a week and we did a  dressing once a week. Electronic Signature(s) Signed: 02/15/2015 4:13:12 PM By: Christin Fudge MD, FACS Previous Signature: 02/15/2015 4:12:54 PM Version By: Christin Fudge MD, FACS Previous Signature: 02/11/2015 12:22:42 PM Version By: Christin Fudge MD, FACS Entered By: Christin Fudge on 02/15/2015 16:13:11 [name], [name] ([id number]) -------------------------------------------------------------------------------- SuperBill Details Patient Name: [name]. Date of Service: 02/11/2015 Medical Record Number: [id number] Patient Account Number: [id number] Date of Birth/Sex: 1956/06/15 (59 y.o. Female) Treating RN: Montey Hora Primary Care Physician: Orlene Erm Other Clinician: Referring Physician: Orlene Erm Treating Physician/Extender: Frann Rider in Treatment: 3 Diagnosis Coding ICD-10 Codes Code Description E11.622 Type 2 diabetes mellitus with other skin ulcer I89.0 Lymphedema, not elsewhere classified E66.01 Morbid (severe) obesity due to excess calories L03.115 Cellulitis of right lower limb Facility Procedures CPT4 Code: [id number] Description: (Facility Use Only) [phone] - APPLY Louretta Parma BOOT RT Modifier: Quantity: 1 Physician Procedures CPT4 Code: [id number] Description: 93570 - WC PHYS LEVEL 3 - EST PT ICD-10 Description Diagnosis E11.622 Type 2 diabetes mellitus with other skin ulc I89.0 Lymphedema, not elsewhere classified E66.01 Morbid (severe) obesity due to excess [place] Modifier: er es Quantity: 1 Engineer, maintenance) Signed: 02/11/2015 12:22:58 PM By: Christin Fudge MD, FACS Entered By: Christin Fudge on 02/11/2015 12:22:58

## 2015-02-12 NOTE — Progress Notes (Signed)
BRIDGID, PRINTZ ([id number]) Visit Report for 02/11/2015 Arrival Information Details Patient Name: [name], [name]. Date of Service: 02/11/2015 11:30 AM Medical Record Number: [id number] Patient Account Number: [id number] Date of Birth/Sex: 03-04-1956 (59 y.o. Female) Treating RN: Montey Hora Primary Care Physician: Orlene Erm Other Clinician: Referring Physician: Orlene Erm Treating Physician/Extender: Frann Rider in Treatment: 3 Visit Information History Since Last Visit Added or deleted any medications: No Patient Arrived: Cane Any new allergies or adverse reactions: No Arrival Time: 11:40 Had a fall or experienced change in No Accompanied By: aself activities of daily living that may affect Transfer Assistance: None risk of falls: Patient Identification Verified: Yes Signs or symptoms of abuse/neglect since last No Secondary Verification Process Yes visito Completed: Hospitalized since last visit: No Patient Has Alerts: Yes Pain Present Now: No Patient Alerts: Borderline Diabetic Electronic Signature(s) Signed: 02/11/2015 4:34:27 PM By: Montey Hora Entered By: Montey Hora on 02/11/2015 11:41:05 Meulemans, [name] ([id number]) -------------------------------------------------------------------------------- Encounter Discharge Information Details Patient Name: [name]. Date of Service: 02/11/2015 11:30 AM Medical Record Number: [id number] Patient Account Number: [id number] Date of Birth/Sex: Sep 10, 1955 (59 y.o. Female) Treating RN: Primary Care Physician: Orlene Erm Other Clinician: Referring Physician: Orlene Erm Treating Physician/Extender: Frann Rider in Treatment: 3 Encounter Discharge Information Items Discharge Pain Level: 0 Discharge Condition: Stable Ambulatory Status: Cane Discharge Destination: Home Transportation: Private Auto Accompanied By: self Schedule Follow-up Appointment: Yes Medication  Reconciliation completed No and provided to Patient/Care [name]: Provided on Clinical Summary of Care: 02/11/2015 Form Type Recipient Paper Patient JS Electronic Signature(s) Signed: 02/11/2015 12:33:18 PM By: Montey Hora Previous Signature: 02/11/2015 12:21:47 PM Version By: Ruthine Dose Entered By: Montey Hora on 02/11/2015 12:33:18 Heuerman, [name] ([id number]) -------------------------------------------------------------------------------- Lower Extremity Assessment Details Patient Name: Elizabeth Landau C. Date of Service: 02/11/2015 11:30 AM Medical Record Number: [id number] Patient Account Number: [id number] Date of Birth/Sex: 12-23-1955 (59 y.o. Female) Treating RN: Montey Hora Primary Care Physician: Orlene Erm Other Clinician: Referring Physician: Orlene Erm Treating Physician/Extender: Frann Rider in Treatment: 3 Edema Assessment Assessed: [Left: No] [Right: No] Edema: [Left: Yes] [Right: Yes] Calf Left: Right: Point of Measurement: 28 cm From Medial Instep 66.3 cm 75.5 cm Ankle Left: Right: Point of Measurement: 11 cm From Medial Instep 47 cm 53 cm Vascular Assessment Pulses: Posterior Tibial Dorsalis Pedis Palpable: [Left:Yes] [Right:Yes] Extremity colors, hair growth, and conditions: Extremity Color: [Left:Hyperpigmented] [Right:Hyperpigmented] Hair Growth on Extremity: [Left:No] [Right:No] Temperature of Extremity: [Left:Warm] [Right:Warm] Capillary Refill: [Left:< 3 seconds] [Right:< 3 seconds] Toe Nail Assessment Left: Right: Thick: No No Discolored: No No Deformed: No No Improper Length and Hygiene: No No Electronic Signature(s) Signed: 02/11/2015 4:34:27 PM By: Montey Hora Entered By: Montey Hora on 02/11/2015 11:57:27 Purdy, [name] ([id number]) -------------------------------------------------------------------------------- Multi Wound Chart Details Patient Name: Elizabeth Landau C. Date of Service: 02/11/2015  11:30 AM Medical Record Number: [id number] Patient Account Number: [id number] Date of Birth/Sex: May 12, 1956 (59 y.o. Female) Treating RN: Montey Hora Primary Care Physician: Orlene Erm Other Clinician: Referring Physician: Orlene Erm Treating Physician/Extender: Frann Rider in Treatment: 3 Vital Signs Height(in): 63 Pulse(bpm): 72 Weight(lbs): 396 Blood Pressure 147/87 (mmHg): Body Mass Index(BMI): 70 Temperature(F): 98.7 Respiratory Rate 20 (breaths/min): Wound Assessments Treatment Notes Electronic Signature(s) Signed: 02/11/2015 4:34:27 PM By: Montey Hora Entered By: Montey Hora on 02/11/2015 12:11:14 [name] ([id number]) -------------------------------------------------------------------------------- Multi-Disciplinary Care Plan Details Patient Name: [name], DESIR. Date of Service: 02/11/2015 11:30 AM Medical Record Number: [id number] Patient Account Number: [id number] Date of Birth/Sex: 1956/04/25 (59  y.o. Female) Treating RN: Montey Hora Primary Care Physician: Orlene Erm Other Clinician: Referring Physician: Orlene Erm Treating Physician/Extender: Frann Rider in Treatment: 3 Active Inactive Abuse / Safety / Falls / Self Care Management Nursing Diagnoses: Potential for falls Goals: Patient will remain injury free Date Initiated: 01/21/2015 Goal Status: Active Interventions: Assess fall risk on admission and as needed Notes: Nutrition Nursing Diagnoses: Impaired glucose control: actual or potential Goals: Patient/caregiver will maintain therapeutic glucose control Date Initiated: 01/21/2015 Goal Status: Active Interventions: Assess patient nutrition upon admission and as needed per policy Notes: Orientation to the Wound Care Program Nursing Diagnoses: Knowledge deficit related to the wound healing center program Goals: Patient/caregiver will verbalize understanding of the [facility] Date Initiated: 01/21/2015 [name], [name] ([id number]) Goal Status: Active Interventions: Provide education on orientation to the wound center Notes: Soft Tissue Infection Nursing Diagnoses: Impaired tissue integrity Goals: Patient will remain free of wound infection Date Initiated: 01/21/2015 Goal Status: Active Interventions: Assess signs and symptoms of infection every visit Notes: Electronic Signature(s) Signed: 02/11/2015 4:34:27 PM By: Montey Hora Entered By: Montey Hora on 02/11/2015 12:11:06 Kulpa, [name] ([id number]) -------------------------------------------------------------------------------- Non-Wound Condition Assessment Details Patient Name: [name]. Date of Service: 02/11/2015 11:30 AM Medical Record Number: [id number] Patient Account Number: [id number] Date of Birth/Sex: 1956-04-05 (59 y.o. Female) Treating RN: Montey Hora Primary Care Physician: Orlene Erm Other Clinician: Referring Physician: Orlene Erm Treating Physician/Extender: Frann Rider in Treatment: 3 Non-Wound Condition: Condition: Lymphedema Location: Leg Side: Right Photos Periwound Skin Texture Texture Color No Abnormalities Noted: No No Abnormalities Noted: No Callus: No Atrophie Blanche: No Crepitus: No Cyanosis: No Excoriation: No Ecchymosis: No Fluctuance: No Erythema: No Friable: No Hemosiderin Staining: No Induration: No Mottled: Yes Localized Edema: No Pallor: No Rash: No Rubor: No Scarring: No Temperature / Pain Moisture Temperature: No Abnormality No Abnormalities Noted: No Dry / Scaly: No Maceration: Yes Moist: Yes Notes area of drainage on right lower posterior leg; right lower leg wrapped with aquacel ag over draining area, abd pad, unna wrap, kerlix and coban Electronic Signature(s) [name], [name] ([id number]) Signed: 02/11/2015 12:37:25 PM By: Montey Hora Previous Signature: 02/11/2015 12:35:19 PM  Version By: Montey Hora Entered By: Montey Hora on 02/11/2015 12:37:25 [name] ([id number]) -------------------------------------------------------------------------------- Patient/Caregiver Education Details Patient Name: [name]. Date of Service: 02/11/2015 11:30 AM Medical Record Number: [id number] Patient Account Number: [id number] Date of Birth/Gender: June 24, 1956 (59 y.o. Female) Treating RN: Montey Hora Primary Care Physician: Orlene Erm Other Clinician: Referring Physician: Orlene Erm Treating Physician/Extender: Frann Rider in Treatment: 3 Education Assessment Education Provided To: Patient Education Topics Provided Basic Hygiene: Handouts: Other: keep skin folds clean and dry to prevent fungal infections Methods: Explain/Verbal Responses: State content correctly Venous: Handouts: Other: compression to unwrapped leg daily Methods: Explain/Verbal Responses: State content correctly Electronic Signature(s) Signed: 02/11/2015 12:34:19 PM By: Montey Hora Entered By: Montey Hora on 02/11/2015 12:34:19 Neyra, [name] ([id number]) -------------------------------------------------------------------------------- [facility] Details Patient Name: [name]. Date of Service: 02/11/2015 11:30 AM Medical Record Number: [id number] Patient Account Number: [id number] Date of Birth/Sex: 05/09/1956 (59 y.o. Female) Treating RN: Montey Hora Primary Care Physician: Orlene Erm Other Clinician: Referring Physician: Orlene Erm Treating Physician/Extender: Frann Rider in Treatment: 3 Vital Signs Time Taken: 11:40 Temperature (F): 98.7 Height (in): 63 Pulse (bpm): 72 Weight (lbs): 396 Respiratory Rate (breaths/min): 20 Body Mass Index (BMI): 70.1 Blood Pressure (mmHg): 147/87 Reference Range: 80 - 120 mg / dl Electronic Signature(s) Signed: 02/11/2015  4:34:27 PM By: Montey Hora Entered By: Montey Hora on 02/11/2015 11:42:07

## 2015-02-17 ENCOUNTER — Encounter: Payer: Medicaid Other | Admitting: Surgery

## 2015-02-17 DIAGNOSIS — L03115 Cellulitis of right lower limb: Secondary | ICD-10-CM | POA: Diagnosis not present

## 2015-02-17 NOTE — Progress Notes (Addendum)
CAMERON, KATAYAMA ([id number]) Visit Report for 02/17/2015 Chief Complaint Document Details Patient Name: [name], [name] 02/17/2015 11:30 Date of Service: AM Medical Record [id number] Number: Patient Account Number: [id number] [birth year] (59 y.o. Treating RN: Cornell Barman Date of Birth/Sex: Female) Other Clinician: Primary Care Physician: Orlene Erm Treating Christin Fudge Referring Physician: Orlene Erm Physician/Extender: Suella Grove in Treatment: 3 Information Obtained from: Patient Chief Complaint Patient presents to the wound care center for a consult due non healing wound. 59 year old patient was known to the wound clinic from previous visits returns with massive lymphedema of the right lower extremity associated with redness and weeping for about 2 weeks Electronic Signature(s) Signed: 02/17/2015 11:59:50 AM By: Christin Fudge MD, FACS Entered By: Christin Fudge on 02/17/2015 11:59:50 [name] ([id number]) -------------------------------------------------------------------------------- HPI Details Patient Name: [name] 02/17/2015 11:30 Date of Service: AM Medical Record [id number] Number: Patient Account Number: [id number] January 11, 1956 (59 y.o. Treating RN: Cornell Barman Date of Birth/Sex: Female) Other Clinician: Primary Care Physician: Orlene Erm Treating Christin Fudge Referring Physician: Orlene Erm Physician/Extender: Weeks in Treatment: 3 History of Present Illness Location: right lower extremity Quality: Patient reports experiencing a dull pain to affected area(s). Severity: Patient states wound are getting worse. Duration: Patient has had the wound for < 2 weeks prior to presenting for treatment Timing: Pain in wound is Intermittent (comes and goes Context: The wound appeared gradually over time Modifying Factors: Consults to this date include:seen by her PCP and recently put on clindamycin Associated Signs and Symptoms:  Patient reports having difficulty standing for long periods. HPI Description: The patient is a 59 year old female with history of hypertension and a long-standing history of bilateral lower extremity lymphedema (first presented on 4/2) . She has had open ulcers in the past which have always responded to compression therapy. She had briefly been to a lymphedema clinic in the past which helped her at the time. this time around she stopped treatment of her lymphedema pumps approximately 2 weeks ago because of some pain in the knees and then noticed the right leg getting worse. She was seen by her PCP who put her on clindamycin 4 times a day 2 days ago. The patient has seen [facility] and Dr. Delana Meyer had seen her last year where a vascular study including venous and arterial duplex studies were within normal limits. he had recommended compression stockings and lymphedema pumps and the patient has been using this in about 2 weeks ago. She is known to be diabetic but in the past few time she's gone to her primary care doctor her hemoglobin A1c has been normal. 02/11/2015 - after her last visit she took my advice and went to the ER regarding the progressive cellulitis of her right lower extremity and she was admitted between July 17 and 22nd. She received IV antibiotics and then was sent home on a course of steroid-induced and oral antibiotics. She has improved much since then. 02/17/2015 -- she has been doing fine and the weeping of her legs has remarkably gone down. She has no fresh issues. Electronic Signature(s) Signed: 02/17/2015 12:00:15 PM By: Christin Fudge MD, FACS Entered By: Christin Fudge on 02/17/2015 12:00:15 [name] ([id number]) -------------------------------------------------------------------------------- Physical Exam Details Patient Name: [name] 02/17/2015 11:30 Date of Service: AM Medical Record [id number] Number: Patient Account Number: [id number] 04-Sep-1955 (59  y.o. Treating RN: Cornell Barman Date of Birth/Sex: Female) Other Clinician: Primary Care Physician: Orlene Erm Treating Christin Fudge Referring Physician: Orlene Erm Physician/Extender: Weeks in Treatment: 3 Constitutional .  Pulse regular. Respirations normal and unlabored. Afebrile. . Eyes Nonicteric. Reactive to light. Ears, Nose, Mouth, and Throat Lips, teeth, and gums WNL.Marland Kitchen Moist mucosa without lesions . Neck supple and nontender. No palpable supraclavicular or cervical adenopathy. Normal sized without goiter. Respiratory WNL. No retractions.. Cardiovascular Pedal Pulses WNL. No clubbing, cyanosis or edema. Chest Breasts symmetical and no nipple discharge.. Breast tissue WNL, no masses, lumps, or tenderness.. Lymphatic No adneopathy. No adenopathy. No adenopathy. Musculoskeletal Adexa without tenderness or enlargement.. Digits and nails w/o clubbing, cyanosis, infection, petechiae, ischemia, or inflammatory conditions.. Integumentary (Hair, Skin) No suspicious lesions. No crepitus or fluctuance. No peri-wound warmth or erythema. No masses.Marland Kitchen Psychiatric Judgement and insight Intact.. No evidence of depression, anxiety, or agitation.. Notes the cellulitis has completely resolved and the right lower extremity has stage III lymphedema but no active weeping. Electronic Signature(s) Signed: 02/17/2015 12:00:48 PM By: Christin Fudge MD, FACS Entered By: Christin Fudge on 02/17/2015 12:00:47 [name] ([id number]) -------------------------------------------------------------------------------- Physician Orders Details Patient Name: [name] 02/17/2015 11:30 Date of Service: AM Medical Record [id number] Number: Patient Account Number: [id number] 1956/03/23 (59 y.o. Treating RN: Cornell Barman Date of Birth/Sex: Female) Other Clinician: Primary Care Physician: Orlene Erm Treating Christin Fudge Referring Physician: Orlene Erm Physician/Extender: Suella Grove in Treatment: 3 Verbal / Phone Orders: Yes Clinician: Cornell Barman Read Back and Verified: Yes Diagnosis Coding Wound Cleansing o Cleanse wound with mild soap and water Primary Wound Dressing o Aquacel Ag Secondary Dressing o ABD pad Dressing Change Frequency o Dressing is to be changed Monday and Thursday. - Monday by [facility]; Thursday in [place]. HHRN may change more often if needed. Follow-up Appointments o Return Appointment in 1 week. Edema Control o Unna Boot to Right Lower Extremity Additional Orders / Instructions o Increase protein intake. o Activity as tolerated [place] Visits - Monday o Home Health Nurse may visit PRN to address patientos wound care needs. o FACE TO FACE ENCOUNTER: MEDICARE and MEDICAID PATIENTS: I certify that this patient is under my care and that I had a face-to-face encounter that meets the physician face-to-face encounter requirements with this patient on this date. The encounter with the patient was in whole or in part for the following MEDICAL CONDITION: (primary reason for [place]) MEDICAL NECESSITY: I certify, that based on my findings, NURSING services are a medically necessary home health service. HOME BOUND STATUS: I certify that my clinical findings support that this patient is homebound (i.e., Due to illness or injury, pt requires aid of supportive devices such as crutches, cane, wheelchairs, walkers, the use of special transportation or the assistance of another person to leave their place of residence. There is a normal inability to leave the home and doing so requires considerable and taxing effort. Other absences are for medical reasons / religious services and are infrequent or of short duration when for other reasons). COLTON, ENGDAHL ([id number]) o If current dressing causes regression in wound condition, may D/C ordered dressing  product/s and apply Normal Saline Moist Dressing daily until next [place] / Other MD appointment. [place] of regression in wound condition at [phone]. o Please direct any NON-WOUND related issues/requests for orders to patient's Primary Care Physician Electronic Signature(s) Signed: 02/17/2015 1:29:04 PM By: Christin Fudge MD, FACS Signed: 02/17/2015 4:21:44 PM By: Gretta Cool RN, BSN, Kim RN, BSN Entered By: Gretta Cool, RN, BSN, Kim on 02/17/2015 12:12:17 [name] ([id number]) -------------------------------------------------------------------------------- Problem List Details Patient Name: SCHWENN,  Tanis C. 02/17/2015 11:30 Date of Service: AM Medical Record [id number] Number: Patient Account Number: [id number] 10-20-1955 (59 y.o. Treating RN: Cornell Barman Date of Birth/Sex: Female) Other Clinician: Primary Care Physician: Orlene Erm Treating Christin Fudge Referring Physician: Orlene Erm Physician/Extender: Weeks in Treatment: 3 Active Problems ICD-10 Encounter Code Description Active Date Diagnosis E11.622 Type 2 diabetes mellitus with other skin ulcer 01/21/2015 Yes I89.0 Lymphedema, not elsewhere classified 01/21/2015 Yes E66.01 Morbid (severe) obesity due to excess calories 01/21/2015 Yes L03.115 Cellulitis of right lower limb 01/21/2015 Yes Inactive Problems Resolved Problems Electronic Signature(s) Signed: 02/17/2015 11:59:35 AM By: Christin Fudge MD, FACS Entered By: Christin Fudge on 02/17/2015 11:59:35 [name] ([id number]) -------------------------------------------------------------------------------- Progress Note Details Patient Name: FREEDOM, LOPEZPEREZ 02/17/2015 11:30 Date of Service: AM Medical Record [id number] Number: Patient Account Number: [id number] [birth year] (59 y.o. Treating RN: Cornell Barman Date of Birth/Sex: Female) Other Clinician: Primary Care Physician: Orlene Erm Treating Christin Fudge Referring Physician: Orlene Erm Physician/Extender: Suella Grove in Treatment: 3 Subjective Chief Complaint Information obtained from Patient Patient presents to the wound care center for a consult due non healing wound. 59 year old patient was known to the wound clinic from previous visits returns with massive lymphedema of the right lower extremity associated with redness and weeping for about 2 weeks History of Present Illness (HPI) The following HPI elements were documented for the patient's wound: Location: right lower extremity Quality: Patient reports experiencing a dull pain to affected area(s). Severity: Patient states wound are getting worse. Duration: Patient has had the wound for < 2 weeks prior to presenting for treatment Timing: Pain in wound is Intermittent (comes and goes Context: The wound appeared gradually over time Modifying Factors: Consults to this date include:seen by her PCP and recently put on clindamycin Associated Signs and Symptoms: Patient reports having difficulty standing for long periods. The patient is a 59 year old female with history of hypertension and a long-standing history of bilateral lower extremity lymphedema (first presented on 4/2) . She has had open ulcers in the past which have always responded to compression therapy. She had briefly been to a lymphedema clinic in the past which helped her at the time. this time around she stopped treatment of her lymphedema pumps approximately 2 weeks ago because of some pain in the knees and then noticed the right leg getting worse. She was seen by her PCP who put her on clindamycin 4 times a day 2 days ago. The patient has seen [facility] and Dr. Delana Meyer had seen her last year where a vascular study including venous and arterial duplex studies were within normal limits. he had recommended compression stockings and lymphedema pumps and the patient has been using this in about 2 weeks ago. She is known to  be diabetic but in the past few time she's gone to her primary care doctor her hemoglobin A1c has been normal. 02/11/2015 - after her last visit she took my advice and went to the ER regarding the progressive cellulitis of her right lower extremity and she was admitted between July 17 and 22nd. She received IV antibiotics and then was sent home on a course of steroid-induced and oral antibiotics. She has improved much since then. HANNA, RA ([id number]) 02/17/2015 -- she has been doing fine and the weeping of her legs has remarkably gone down. She has no fresh issues. Objective Constitutional Pulse regular. Respirations normal and unlabored. Afebrile. Vitals Time Taken: 11:48 AM, Height: 63 in, Weight: 379.6 lbs, BMI: 67.2, Temperature: 97.8 F, Pulse: 72  bpm, Blood Pressure: 130/78 mmHg. Eyes Nonicteric. Reactive to light. Ears, Nose, Mouth, and Throat Lips, teeth, and gums WNL.Marland Kitchen Moist mucosa without lesions . Neck supple and nontender. No palpable supraclavicular or cervical adenopathy. Normal sized without goiter. Respiratory WNL. No retractions.. Cardiovascular Pedal Pulses WNL. No clubbing, cyanosis or edema. Chest Breasts symmetical and no nipple discharge.. Breast tissue WNL, no masses, lumps, or tenderness.. Lymphatic No adneopathy. No adenopathy. No adenopathy. Musculoskeletal Adexa without tenderness or enlargement.. Digits and nails w/o clubbing, cyanosis, infection, petechiae, ischemia, or inflammatory conditions.Marland Kitchen Psychiatric Judgement and insight Intact.. No evidence of depression, anxiety, or agitation.. General Notes: the cellulitis has completely resolved and the right lower extremity has stage III lymphedema but no active weeping. Integumentary (Hair, Skin) Jeanpaul, Kea C. ([id number]) No suspicious lesions. No crepitus or fluctuance. No peri-wound warmth or erythema. No masses.. Other Condition(s) Patient presents with Lymphedema located on the Right  Leg. The skin appearance exhibited: Moist, Mottled. The skin appearance did not exhibit: Atrophie Blanche, Callus, Crepitus, Cyanosis, Dry/Scaly, Ecchymosis, Erythema, Excoriation, Fluctuance, Friable, Hemosiderin Staining, Induration, Localized Edema, Maceration, Pallor, Rash, Rubor, Scarring. Skin temperature was noted as No Abnormality. Assessment Active Problems ICD-10 E11.622 - Type 2 diabetes mellitus with other skin ulcer I89.0 - Lymphedema, not elsewhere classified E66.01 - Morbid (severe) obesity due to excess calories L03.115 - Cellulitis of right lower limb We will use silver alginate and the Unna's boot this week and we will probably transition to the lymphedema clinic soon. She is wearing her compression hose on the left lower extremity and continuing to use lymphedema pumps at home. She will follow-up with [place] next week. Plan Wound Cleansing: Cleanse wound with mild soap and water Primary Wound Dressing: Aquacel Ag Secondary Dressing: ABD pad Dressing Change Frequency: Dressing is to be changed Monday and Thursday. - Monday by [facility]; Thursday in [place]. HHRN may change more often if needed. Follow-up Appointments: Return Appointment in 1 week. Edema Control: Unna Boot to Right Lower Extremity Additional Orders / Instructions: Increase protein intake. Activity as tolerated Home Health: BRISTOL, OSENTOSKI ([id number]) [place] Visits - Monday Home Health Nurse may visit PRN to address patient s wound care needs. FACE TO FACE ENCOUNTER: MEDICARE and MEDICAID PATIENTS: I certify that this patient is under my care and that I had a face-to-face encounter that meets the physician face-to-face encounter requirements with this patient on this date. The encounter with the patient was in whole or in part for the following MEDICAL CONDITION: (primary reason for [place]) MEDICAL NECESSITY: I certify, that based on my findings, NURSING services are a  medically necessary home health service. HOME BOUND STATUS: I certify that my clinical findings support that this patient is homebound (i.e., Due to illness or injury, pt requires aid of supportive devices such as crutches, cane, wheelchairs, walkers, the use of special transportation or the assistance of another person to leave their place of residence. There is a normal inability to leave the home and doing so requires considerable and taxing effort. Other absences are for medical reasons / religious services and are infrequent or of short duration when for other reasons). If current dressing causes regression in wound condition, may D/C ordered dressing product/s and apply Normal Saline Moist Dressing daily until next [place] / Other MD appointment. [place] of regression in wound condition at [phone]. Please direct any NON-WOUND related issues/requests for orders to patient's Primary Care Physician We will use silver alginate and the  Unna's boot this week and we will probably transition to the lymphedema clinic soon. She is wearing her compression hose on the left lower extremity and continuing to use lymphedema pumps at home. She will follow-up with [place] next week. Electronic Signature(s) Signed: 02/18/2015 4:29:59 PM By: Christin Fudge MD, FACS Previous Signature: 02/18/2015 4:29:15 PM Version By: Christin Fudge MD, FACS Previous Signature: 02/17/2015 12:01:48 PM Version By: Christin Fudge MD, FACS Entered By: Christin Fudge on 02/18/2015 16:29:59 [name] ([id number]) -------------------------------------------------------------------------------- SuperBill Details Patient Name: [name]. Date of Service: 02/17/2015 Medical Record Number: [id number] Patient Account Number: [id number] Date of Birth/Sex: [birth year] (59 y.o. Female) Treating RN: Cornell Barman Primary Care Physician: Orlene Erm Other Clinician: Referring Physician: Orlene Erm Treating Physician/Extender: Frann Rider in Treatment: 3 Diagnosis Coding ICD-10 Codes Code Description E11.622 Type 2 diabetes mellitus with other skin ulcer I89.0 Lymphedema, not elsewhere classified E66.01 Morbid (severe) obesity due to excess calories L03.115 Cellulitis of right lower limb Facility Procedures CPT4 Code: [id number] Description: (Facility Use Only) [phone] - APPLY Louretta Parma BOOT RT Modifier: Quantity: 1 Physician Procedures CPT4 Code: [id number] Description: 94854 - WC PHYS LEVEL 3 - EST PT ICD-10 Description Diagnosis E11.622 Type 2 diabetes mellitus with other skin ulc I89.0 Lymphedema, not elsewhere classified E66.01 Morbid (severe) obesity due to excess [place]ori L03.115 Cellulitis of right  lower limb Modifier: er es Quantity: 1 Electronic Signature(s) Signed: 02/18/2015 12:28:48 PM By: Christin Fudge MD, FACS Signed: 02/18/2015 12:39:36 PM By: Gretta Cool RN, BSN, Kim RN, BSN Previous Signature: 02/17/2015 12:02:06 PM Version By: Christin Fudge MD, FACS Entered By: Gretta Cool, RN, BSN, Kim on 02/18/2015 09:27:37

## 2015-02-18 NOTE — Progress Notes (Signed)
KOLBEE, STALLMAN ([id number]) Visit Report for 02/17/2015 Arrival Information Details Patient Name: [name], [name]. Date of Service: 02/17/2015 11:30 AM Medical Record Number: [id number] Patient Account Number: [id number] Date of Birth/Sex: 1955/11/30 (59 y.o. Female) Treating RN: Cornell Barman Primary Care Physician: Orlene Erm Other Clinician: Referring Physician: Orlene Erm Treating Physician/Extender: Frann Rider in Treatment: 3 Visit Information History Since Last Visit Added or deleted any medications: No Patient Arrived: [name] [name] Any new allergies or adverse reactions: No Arrival Time: 11:45 Had a fall or experienced change in No Accompanied By: self activities of daily living that may affect Transfer Assistance: None risk of falls: Patient Identification Verified: Yes Signs or symptoms of abuse/neglect since last No Secondary Verification Process Yes visito Completed: Hospitalized since last visit: No Patient Has Alerts: Yes Has Dressing in Place as Prescribed: Yes Patient Alerts: Borderline Has Compression in Place as Prescribed: Yes Diabetic Pain Present Now: No Electronic Signature(s) Signed: 02/17/2015 4:21:44 PM By: Gretta Cool, RN, BSN, Kim RN, BSN Entered By: Gretta Cool, RN, BSN, Kim on 02/17/2015 11:48:21 [name] [name], [name] [name] ([id number]) -------------------------------------------------------------------------------- Encounter Discharge Information Details Patient Name: [name] [name]. Date of Service: 02/17/2015 11:30 AM Medical Record Number: [id number] Patient Account Number: [id number] Date of Birth/Sex: May 17, 1956 (59 y.o. Female) Treating RN: Cornell Barman Primary Care Physician: Orlene Erm Other Clinician: Referring Physician: Orlene Erm Treating Physician/Extender: Frann Rider in Treatment: 3 Encounter Discharge Information Items Discharge Pain Level: 0 Discharge Condition: Stable Ambulatory Status: Cane Discharge  Destination: Home Transportation: Private Auto Accompanied By: self Schedule Follow-up Appointment: Yes Medication Reconciliation completed Yes and provided to Patient/Care [name]: Provided on Clinical Summary of Care: 02/17/2015 Form Type Recipient Paper Patient JS Electronic Signature(s) Signed: 02/17/2015 4:21:44 PM By: Gretta Cool RN, BSN, Kim RN, BSN Previous Signature: 02/17/2015 12:12:02 PM Version By: Ruthine Dose Entered By: Gretta Cool RN, BSN, Kim on 02/17/2015 12:13:20 [name] [name] ([id number]) -------------------------------------------------------------------------------- Lower Extremity Assessment Details Patient Name: [name] [name]. Date of Service: 02/17/2015 11:30 AM Medical Record Number: [id number] Patient Account Number: [id number] Date of Birth/Sex: 1956-07-05 (59 y.o. Female) Treating RN: Cornell Barman Primary Care Physician: Orlene Erm Other Clinician: Referring Physician: Orlene Erm Treating Physician/Extender: Frann Rider in Treatment: 3 Edema Assessment Assessed: [Left: No] [Right: No] E[Left: dema] [Right: :] Calf Left: Right: Point of Measurement: 28 cm From Medial Instep cm 74.9 cm Ankle Left: Right: Point of Measurement: 11 cm From Medial Instep cm 52 cm Vascular Assessment Pulses: Posterior Tibial Dorsalis Pedis Palpable: [Right:Yes] Extremity colors, hair growth, and conditions: Extremity Color: [Right:Hyperpigmented] Hair Growth on Extremity: [Right:No] Temperature of Extremity: [Right:Warm] Capillary Refill: [Right:< 3 seconds] Toe Nail Assessment Left: Right: Thick: No Discolored: No Deformed: No Improper Length and Hygiene: No Electronic Signature(s) Signed: 02/17/2015 4:21:44 PM By: Gretta Cool, RN, BSN, Kim RN, BSN Entered By: Gretta Cool, RN, BSN, Kim on 02/17/2015 11:53:40 [name] [name], [name] [name] ([id number]) -------------------------------------------------------------------------------- Multi Wound Chart Details Patient  Name: [name] [name]. Date of Service: 02/17/2015 11:30 AM Medical Record Number: [id number] Patient Account Number: [id number] Date of Birth/Sex: [birth year] (59 y.o. Female) Treating RN: Cornell Barman Primary Care Physician: Orlene Erm Other Clinician: Referring Physician: Orlene Erm Treating Physician/Extender: Frann Rider in Treatment: 3 Vital Signs Height(in): 63 Pulse(bpm): 72 Weight(lbs): 379.6 Blood Pressure 130/78 (mmHg): Body Mass Index(BMI): 67 Temperature(F): 97.8 Respiratory Rate (breaths/min): Wound Assessments Treatment Notes Electronic Signature(s) Signed: 02/17/2015 4:21:44 PM By: Gretta Cool, RN, BSN, Kim RN, BSN Entered By: Gretta Cool, RN, BSN, Kim on 02/17/2015 11:57:04 [name] [name] ([id number]) -------------------------------------------------------------------------------- [place]  Details Patient Name: [name], [name]. Date of Service: 02/17/2015 11:30 AM Medical Record Number: [id number] Patient Account Number: [id number] Date of Birth/Sex: Nov 15, 1955 (59 y.o. Female) Treating RN: Cornell Barman Primary Care Physician: Orlene Erm Other Clinician: Referring Physician: Orlene Erm Treating Physician/Extender: Frann Rider in Treatment: 3 Active Inactive Abuse / Safety / Falls / Self Care Management Nursing Diagnoses: Potential for falls Goals: Patient will remain injury free Date Initiated: 01/21/2015 Goal Status: Active Interventions: Assess fall risk on admission and as needed Notes: Nutrition Nursing Diagnoses: Impaired glucose control: actual or potential Goals: Patient/caregiver will maintain therapeutic glucose control Date Initiated: 01/21/2015 Goal Status: Active Interventions: Assess patient nutrition upon admission and as needed per policy Notes: Orientation to the Wound Care Program Nursing Diagnoses: Knowledge deficit related to the wound healing center  program Goals: Patient/caregiver will verbalize understanding of the [facility] Date Initiated: 01/21/2015 [name] [name] ([id number]) Goal Status: Active Interventions: Provide education on orientation to the wound center Notes: Soft Tissue Infection Nursing Diagnoses: Impaired tissue integrity Goals: Patient will remain free of wound infection Date Initiated: 01/21/2015 Goal Status: Active Interventions: Assess signs and symptoms of infection every visit Notes: Electronic Signature(s) Signed: 02/17/2015 4:21:44 PM By: Gretta Cool, RN, BSN, Kim RN, BSN Entered By: Gretta Cool, RN, BSN, Kim on 02/17/2015 11:56:57 [name] [name], [name] [name] ([id number]) -------------------------------------------------------------------------------- Non-Wound Condition Assessment Details Patient Name: [name] [name]. Date of Service: 02/17/2015 11:30 AM Medical Record Number: [id number] Patient Account Number: [id number] Date of Birth/Sex: 01/06/1956 (59 y.o. Female) Treating RN: Cornell Barman Primary Care Physician: Orlene Erm Other Clinician: Referring Physician: Orlene Erm Treating Physician/Extender: Frann Rider in Treatment: 3 Non-Wound Condition: Condition: Lymphedema Location: Leg Side: Right Periwound Skin Texture Texture Color No Abnormalities Noted: No No Abnormalities Noted: No Callus: No Atrophie Blanche: No Crepitus: No Cyanosis: No Excoriation: No Ecchymosis: No Fluctuance: No Erythema: No Friable: No Hemosiderin Staining: No Induration: No Mottled: Yes Localized Edema: No Pallor: No Rash: No Rubor: No Scarring: No Temperature / Pain Moisture Temperature: No Abnormality No Abnormalities Noted: No Dry / Scaly: No Maceration: No Moist: Yes Electronic Signature(s) Signed: 02/17/2015 4:21:44 PM By: Gretta Cool, RN, BSN, Kim RN, BSN Entered By: Gretta Cool, RN, BSN, Kim on 02/17/2015 12:14:05 [name] [name], [name] [name]  ([id number]) -------------------------------------------------------------------------------- Pain Assessment Details Patient Name: [name] [name]. Date of Service: 02/17/2015 11:30 AM Medical Record Number: [id number] Patient Account Number: [id number] Date of Birth/Sex: [birth year] (59 y.o. Female) Treating RN: Cornell Barman Primary Care Physician: Orlene Erm Other Clinician: Referring Physician: Orlene Erm Treating Physician/Extender: Frann Rider in Treatment: 3 Active Problems Location of Pain Severity and Description of Pain Patient Has Paino No Site Locations Pain Management and Medication Current Pain Management: Electronic Signature(s) Signed: 02/17/2015 4:21:44 PM By: Gretta Cool, RN, BSN, Kim RN, BSN Entered By: Gretta Cool, RN, BSN, Kim on 02/17/2015 11:48:27 [name] [name] ([id number]) -------------------------------------------------------------------------------- Patient/Caregiver Education Details Patient Name: [name] [name] Date of Service: 02/17/2015 11:30 AM Medical Record Number: [id number] Patient Account Number: [id number] Date of Birth/Gender: [birth year] (59 y.o. Female) Treating RN: Cornell Barman Primary Care Physician: Orlene Erm Other Clinician: Referring Physician: Orlene Erm Treating Physician/Extender: Frann Rider in Treatment: 3 Education Assessment Education Provided To: Patient Education Topics Provided Wound/Skin Impairment: Handouts: Caring for Your Ulcer, Other: continue wound care as prescribed Electronic Signature(s) Signed: 02/17/2015 4:21:44 PM By: Gretta Cool, RN, BSN, Kim RN, BSN Entered By: Gretta Cool, RN, BSN, Kim on 02/17/2015 12:13:41 [name] [name] ([id number]) -------------------------------------------------------------------------------- Vitals Details Patient Name: [name] [name]. Date of  Service: 02/17/2015 11:30 AM Medical Record Number: [id number] Patient Account Number: [id number] Date of  Birth/Sex: [birth year] (59 y.o. Female) Treating RN: Cornell Barman Primary Care Physician: Orlene Erm Other Clinician: Referring Physician: Orlene Erm Treating Physician/Extender: Frann Rider in Treatment: 3 Vital Signs Time Taken: 11:48 Temperature (F): 97.8 Height (in): 63 Pulse (bpm): 72 Weight (lbs): 379.6 Blood Pressure (mmHg): 130/78 Body Mass Index (BMI): 67.2 Reference Range: 80 - 120 mg / dl Electronic Signature(s) Signed: 02/17/2015 4:21:44 PM By: Gretta Cool, RN, BSN, Kim RN, BSN Entered By: Gretta Cool, RN, BSN, Kim on 02/17/2015 11:49:07

## 2015-02-24 ENCOUNTER — Encounter: Payer: Medicaid Other | Admitting: Surgery

## 2015-02-24 DIAGNOSIS — L03115 Cellulitis of right lower limb: Secondary | ICD-10-CM | POA: Diagnosis not present

## 2015-02-25 NOTE — Progress Notes (Signed)
[name] ([id number]) Visit Report for 02/24/2015 Arrival Information Details Patient Name: [name], [name]. Date of Service: 02/24/2015 1:45 PM Medical Record Number: [id number] Patient Account Number: [id number] Date of Birth/Sex: 1955-12-20 (59 y.o. Female) Treating RN: Cornell Barman Primary Care Physician: Orlene Erm Other Clinician: Referring Physician: Orlene Erm Treating Physician/Extender: Frann Rider in Treatment: 4 Visit Information History Since Last Visit Added or deleted any medications: No Patient Arrived: [name] Any new allergies or adverse reactions: No Arrival Time: 13:49 Had a fall or experienced change in No Accompanied By: self activities of daily living that may affect Transfer Assistance: None risk of falls: Patient Identification Verified: Yes Signs or symptoms of abuse/neglect since last No Secondary Verification Process Yes visito Completed: Hospitalized since last visit: No Patient Has Alerts: Yes Has Dressing in Place as Prescribed: Yes Patient Alerts: Borderline Pain Present Now: No Diabetic Electronic Signature(s) Signed: 02/24/2015 4:47:08 PM By: Gretta Cool, RN, BSN, Kim RN, BSN Entered By: Gretta Cool, RN, BSN, Kim on 02/24/2015 13:49:37 [name] ([id number]) -------------------------------------------------------------------------------- Clinic Level of Care Assessment Details Patient Name: [name]. Date of Service: 02/24/2015 1:45 PM Medical Record Number: [id number] Patient Account Number: [id number] Date of Birth/Sex: 1955/10/13 (59 y.o. Female) Treating RN: Cornell Barman Primary Care Physician: Orlene Erm Other Clinician: Referring Physician: Orlene Erm Treating Physician/Extender: Frann Rider in Treatment: 4 Clinic Level of Care Assessment Items TOOL 4 Quantity Score []  - Use when only an EandM is performed on FOLLOW-UP visit 0 ASSESSMENTS - Nursing Assessment / Reassessment []  -  Reassessment of Co-morbidities (includes updates in patient status) 0 X - Reassessment of Adherence to Treatment Plan 1 5 ASSESSMENTS - Wound and Skin Assessment / Reassessment X - Simple Wound Assessment / Reassessment - one wound 1 5 []  - Complex Wound Assessment / Reassessment - multiple wounds 0 []  - Dermatologic / Skin Assessment (not related to wound area) 0 ASSESSMENTS - Focused Assessment []  - Circumferential Edema Measurements - multi extremities 0 []  - Nutritional Assessment / Counseling / Intervention 0 []  - Lower Extremity Assessment (monofilament, tuning fork, pulses) 0 []  - Peripheral Arterial Disease Assessment (using hand held doppler) 0 ASSESSMENTS - Ostomy and/or Continence Assessment and Care []  - Incontinence Assessment and Management 0 []  - Ostomy Care Assessment and Management (repouching, etc.) 0 PROCESS - Coordination of Care X - Simple Patient / Family Education for ongoing care 1 15 []  - Complex (extensive) Patient / Family Education for ongoing care 0 []  - Staff obtains Programmer, systems, Records, Test Results / Process Orders 0 []  - Staff telephones HHA, Nursing Homes / Clarify orders / etc 0 []  - Routine Transfer to another Facility (non-emergent condition) 0 [name]. ([id number]) []  - Routine Hospital Admission (non-emergent condition) 0 []  - New Admissions / Biomedical engineer / Ordering NPWT, Apligraf, etc. 0 []  - Emergency Hospital Admission (emergent condition) 0 X - Simple Discharge Coordination 1 10 []  - Complex (extensive) Discharge Coordination 0 PROCESS - Special Needs []  - Pediatric / Minor Patient Management 0 []  - Isolation Patient Management 0 []  - Hearing / Language / Visual special needs 0 []  - Assessment of Community assistance (transportation, D/C planning, etc.) 0 []  - Additional assistance / Altered mentation 0 []  - Support Surface(s) Assessment (bed, cushion, seat, etc.) 0 INTERVENTIONS - Wound Cleansing / Measurement []  -  Simple Wound Cleansing - one wound 0 []  - Complex Wound Cleansing - multiple wounds 0 []  - Wound Imaging (photographs - any number of wounds) 0 []  -  Wound Tracing (instead of photographs) 0 []  - Simple Wound Measurement - one wound 0 []  - Complex Wound Measurement - multiple wounds 0 INTERVENTIONS - Wound Dressings []  - Small Wound Dressing one or multiple wounds 0 []  - Medium Wound Dressing one or multiple wounds 0 []  - Large Wound Dressing one or multiple wounds 0 []  - Application of Medications - topical 0 []  - Application of Medications - injection 0 INTERVENTIONS - Miscellaneous []  - External ear exam 0 [name], [name] ([id number]) []  - Specimen Collection (cultures, biopsies, blood, body fluids, etc.) 0 []  - Specimen(s) / Culture(s) sent or taken to Lab for analysis 0 []  - Patient Transfer (multiple staff / Harrel Lemon Lift / Similar devices) 0 []  - Simple Staple / Suture removal (25 or less) 0 []  - Complex Staple / Suture removal (26 or more) 0 []  - Hypo / Hyperglycemic Management (close monitor of Blood Glucose) 0 []  - Ankle / Brachial Index (ABI) - do not check if billed separately 0 X - Vital Signs 1 5 Has the patient been seen at the hospital within the last three years: Yes Total Score: 40 Level Of Care: New/Established - Level 2 Electronic Signature(s) Signed: 02/24/2015 4:47:08 PM By: Gretta Cool, RN, BSN, Kim RN, BSN Entered By: Gretta Cool, RN, BSN, Kim on 02/24/2015 14:06:38 [name] ([id number]) -------------------------------------------------------------------------------- Encounter Discharge Information Details Patient Name: [name]. Date of Service: 02/24/2015 1:45 PM Medical Record Number: [id number] Patient Account Number: [id number] Date of Birth/Sex: 08-Apr-1956 (59 y.o. Female) Treating RN: Cornell Barman Primary Care Physician: Orlene Erm Other Clinician: Referring Physician: Orlene Erm Treating Physician/Extender: Frann Rider in  Treatment: 4 Encounter Discharge Information Items Discharge Pain Level: 0 Discharge Condition: Stable Ambulatory Status: Cane Discharge Destination: Home Transportation: Private Auto Accompanied By: self Schedule Follow-up Appointment: Yes Medication Reconciliation completed No and provided to Patient/Care Zan Orlick: Provided on Clinical Summary of Care: 02/24/2015 Form Type Recipient Paper Patient JS Electronic Signature(s) Signed: 02/24/2015 2:16:59 PM By: Ruthine Dose Entered By: Ruthine Dose on 02/24/2015 14:16:59 Aceituno, Ellamae [name] ([id number]) -------------------------------------------------------------------------------- Lower Extremity Assessment Details Patient Name: [name]. Date of Service: 02/24/2015 1:45 PM Medical Record Number: [id number] Patient Account Number: [id number] Date of Birth/Sex: June 19, 1956 (59 y.o. Female) Treating RN: Cornell Barman Primary Care Physician: Orlene Erm Other Clinician: Referring Physician: Orlene Erm Treating Physician/Extender: Frann Rider in Treatment: 4 Edema Assessment Assessed: [Left: No] [Right: No] E[Left: dema] [Right: :] Calf Left: Right: Point of Measurement: 28 cm From Medial Instep cm 75 cm Ankle Left: Right: Point of Measurement: 11 cm From Medial Instep cm 51 cm Vascular Assessment Pulses: Posterior Tibial Dorsalis Pedis Palpable: [Right:Yes] Extremity colors, hair growth, and conditions: Extremity Color: [Right:Hyperpigmented] Hair Growth on Extremity: [Right:No] Temperature of Extremity: [Right:Warm] Capillary Refill: [Right:< 3 seconds] Toe Nail Assessment Left: Right: Thick: No Discolored: No Deformed: No Improper Length and Hygiene: No Electronic Signature(s) Signed: 02/24/2015 4:47:08 PM By: Gretta Cool, RN, BSN, Kim RN, BSN Entered By: Gretta Cool, RN, BSN, Kim on 02/24/2015 13:59:18 Graeagle, Ellamae [name]  ([id number]) -------------------------------------------------------------------------------- Multi Wound Chart Details Patient Name: [name]. Date of Service: 02/24/2015 1:45 PM Medical Record Number: [id number] Patient Account Number: [id number] Date of Birth/Sex: 10/16/1955 (59 y.o. Female) Treating RN: Cornell Barman Primary Care Physician: Orlene Erm Other Clinician: Referring Physician: Orlene Erm Treating Physician/Extender: Frann Rider in Treatment: 4 Vital Signs Height(in): 63 Pulse(bpm): 74 Weight(lbs): 379.6 Blood Pressure 130/66 (mmHg): Body Mass Index(BMI): 67 Temperature(F): 98.2 Respiratory Rate 22 (breaths/min): Wound Assessments Treatment  Notes Electronic Signature(s) Signed: 02/24/2015 4:47:08 PM By: Gretta Cool, RN, BSN, Kim RN, BSN Entered By: Gretta Cool, RN, BSN, Kim on 02/24/2015 14:01:08 [name] ([id number]) -------------------------------------------------------------------------------- Multi-Disciplinary Care Plan Details Patient Name: JEMIAH, CUADRA. Date of Service: 02/24/2015 1:45 PM Medical Record Number: [id number] Patient Account Number: [id number] Date of Birth/Sex: [birth year] (59 y.o. Female) Treating RN: Cornell Barman Primary Care Physician: Orlene Erm Other Clinician: Referring Physician: Orlene Erm Treating Physician/Extender: Frann Rider in Treatment: 4 Active Inactive Electronic Signature(s) Signed: 02/24/2015 5:02:19 PM By: Gretta Cool RN, BSN, Kim RN, BSN Previous Signature: 02/24/2015 4:47:08 PM Version By: Gretta Cool RN, BSN, Kim RN, BSN Entered By: Gretta Cool, RN, BSN, Kim on 02/24/2015 16:57:53 Riedesel, Ellamae [name] ([id number]) -------------------------------------------------------------------------------- Non-Wound Condition Assessment Details Patient Name: KYSHA, MURALLES C. Date of Service: 02/24/2015 1:45 PM Medical Record Number: [id number] Patient Account Number: [id number] Date of Birth/Sex:  [birth year] (59 y.o. Female) Treating RN: Cornell Barman Primary Care Physician: Orlene Erm Other Clinician: Referring Physician: Orlene Erm Treating Physician/Extender: Frann Rider in Treatment: 4 Non-Wound Condition: Condition: Lymphedema Location: Leg Side: Right Photos Periwound Skin Texture Texture Color No Abnormalities Noted: No No Abnormalities Noted: No Callus: No Atrophie Blanche: No Crepitus: No Cyanosis: No Excoriation: No Ecchymosis: No Fluctuance: No Erythema: No Friable: No Hemosiderin Staining: No Induration: No Mottled: Yes Localized Edema: No Pallor: No Rash: No Rubor: No Scarring: No Temperature / Pain Moisture Temperature: No Abnormality No Abnormalities Noted: No Dry / Scaly: No Maceration: No Moist: Yes Electronic Signature(s) Signed: 02/24/2015 4:47:08 PM By: Gretta Cool, RN, BSN, Kim RN, BSN Entered By: Gretta Cool, RN, BSN, Kim on 02/24/2015 14:56:35 Welford, Ellamae [name] ([id number]) Nealis, Ellamae [name] ([id number]) -------------------------------------------------------------------------------- Pain Assessment Details Patient Name: [name]. Date of Service: 02/24/2015 1:45 PM Medical Record Number: [id number] Patient Account Number: [id number] Date of Birth/Sex: 1955/10/21 (59 y.o. Female) Treating RN: Cornell Barman Primary Care Physician: Orlene Erm Other Clinician: Referring Physician: Orlene Erm Treating Physician/Extender: Frann Rider in Treatment: 4 Active Problems Location of Pain Severity and Description of Pain Patient Has Paino No Site Locations Pain Management and Medication Current Pain Management: Electronic Signature(s) Signed: 02/24/2015 4:47:08 PM By: Gretta Cool, RN, BSN, Kim RN, BSN Entered By: Gretta Cool, RN, BSN, Kim on 02/24/2015 13:49:46 Garden, Ellamae [name] ([id number]) -------------------------------------------------------------------------------- Patient/Caregiver Education Details Patient  Name: [name] Date of Service: 02/24/2015 1:45 PM Medical Record Number: [id number] Patient Account Number: [id number] Date of Birth/Gender: February 04, 1956 (59 y.o. Female) Treating RN: Cornell Barman Primary Care Physician: Orlene Erm Other Clinician: Referring Physician: Orlene Erm Treating Physician/Extender: Frann Rider in Treatment: 4 Education Assessment Education Provided To: Patient Education Topics Provided Wound/Skin Impairment: Handouts: Other: contine to wear farrow wraps and use pumps Electronic Signature(s) Signed: 02/24/2015 4:47:08 PM By: Gretta Cool, RN, BSN, Kim RN, BSN Entered By: Gretta Cool, RN, BSN, Kim on 02/24/2015 14:07:57 Perlstein, Ellamae [name] ([id number]) -------------------------------------------------------------------------------- [place] Details Patient Name: [name] Date of Service: 02/24/2015 1:45 PM Medical Record Number: [id number] Patient Account Number: [id number] Date of Birth/Sex: 1955-08-05 (59 y.o. Female) Treating RN: Cornell Barman Primary Care Physician: Orlene Erm Other Clinician: Referring Physician: Orlene Erm Treating Physician/Extender: Frann Rider in Treatment: 4 Vital Signs Time Taken: 13:49 Temperature (F): 98.2 Height (in): 63 Pulse (bpm): 74 Weight (lbs): 379.6 Respiratory Rate (breaths/min): 22 Body Mass Index (BMI): 67.2 Blood Pressure (mmHg): 130/66 Reference Range: 80 - 120 mg / dl Electronic Signature(s) Signed: 02/24/2015 4:47:08 PM By: Gretta Cool, RN, BSN, Kim RN, BSN Entered By: Gretta Cool, RN, BSN, Kim on 02/24/2015 13:53:18

## 2015-02-25 NOTE — Progress Notes (Signed)
JANIFER, GIESELMAN ([id number]) Visit Report for 02/24/2015 Chief Complaint Document Details Patient Name: [name], [name]. Date of Service: 02/24/2015 1:45 PM Medical Record Number: [id number] Patient Account Number: [id number] Date of Birth/Sex: 24-Jan-1956 (59 y.o. Female) Treating RN: Cornell Barman Primary Care Physician: Orlene Erm Other Clinician: Referring Physician: Orlene Erm Treating Physician/Extender: Frann Rider in Treatment: 4 Information Obtained from: Patient Chief Complaint Patient presents to the wound care center for a consult due non healing wound. 59 year old patient was known to the wound clinic from previous visits returns with massive lymphedema of the right lower extremity associated with redness and weeping for about 2 weeks Electronic Signature(s) Signed: 02/24/2015 2:08:28 PM By: Christin Fudge MD, FACS Entered By: Christin Fudge on 02/24/2015 14:08:28 Afzal, [name] ([id number]) -------------------------------------------------------------------------------- HPI Details Patient Name: [name]. Date of Service: 02/24/2015 1:45 PM Medical Record Number: [id number] Patient Account Number: [id number] Date of Birth/Sex: October 13, 1955 (59 y.o. Female) Treating RN: Cornell Barman Primary Care Physician: Orlene Erm Other Clinician: Referring Physician: Orlene Erm Treating Physician/Extender: Frann Rider in Treatment: 4 History of Present Illness Location: right lower extremity Quality: Patient reports experiencing a dull pain to affected area(s). Severity: Patient states wound are getting worse. Duration: Patient has had the wound for < 2 weeks prior to presenting for treatment Timing: Pain in wound is Intermittent (comes and goes Context: The wound appeared gradually over time Modifying Factors: Consults to this date include:seen by her PCP and recently put on clindamycin Associated Signs and Symptoms: Patient reports  having difficulty standing for long periods. HPI Description: The patient is a 59 year old female with history of hypertension and a long-standing history of bilateral lower extremity lymphedema (first presented on 4/2) . She has had open ulcers in the past which have always responded to compression therapy. She had briefly been to a lymphedema clinic in the past which helped her at the time. this time around she stopped treatment of her lymphedema pumps approximately 2 weeks ago because of some pain in the knees and then noticed the right leg getting worse. She was seen by her PCP who put her on clindamycin 4 times a day 2 days ago. The patient has seen [facility] and Dr. Delana Meyer had seen her last year where a vascular study including venous and arterial duplex studies were within normal limits. he had recommended compression stockings and lymphedema pumps and the patient has been using this in about 2 weeks ago. She is known to be diabetic but in the past few time she's gone to her primary care doctor her hemoglobin A1c has been normal. 02/11/2015 - after her last visit she took my advice and went to the ER regarding the progressive cellulitis of her right lower extremity and she was admitted between July 17 and 22nd. She received IV antibiotics and then was sent home on a course of steroid-induced and oral antibiotics. She has improved much since then. 02/17/2015 -- she has been doing fine and the weeping of her legs has remarkably gone down. She has no fresh issues. Electronic Signature(s) Signed: 02/24/2015 2:09:54 PM By: Christin Fudge MD, FACS Entered By: Christin Fudge on 02/24/2015 14:09:54 [name] ([id number]) -------------------------------------------------------------------------------- Physical Exam Details Patient Name: [name], [name] Date of Service: 02/24/2015 1:45 PM Medical Record Number: [id number] Patient Account Number: [id number] Date of Birth/Sex: 02-Aug-1955 (59  y.o. Female) Treating RN: Cornell Barman Primary Care Physician: Orlene Erm Other Clinician: Referring Physician: Orlene Erm Treating Physician/Extender: Frann Rider in Treatment: 4 Constitutional .  Pulse regular. Respirations normal and unlabored. Afebrile. . Eyes Nonicteric. Reactive to light. Ears, Nose, Mouth, and Throat Lips, teeth, and gums WNL.Marland Kitchen Moist mucosa without lesions . Neck supple and nontender. No palpable supraclavicular or cervical adenopathy. Normal sized without goiter. Respiratory WNL. No retractions.. Breath sounds WNL, No rubs, rales, rhonchi, or wheeze.. Cardiovascular Heart rhythm and rate regular, no murmur or gallop.. Pedal Pulses WNL. massive stage III lymphedema both lower extremities but no evidence of cellulitis.. Chest Breasts symmetical and no nipple discharge.. Breast tissue WNL, no masses, lumps, or tenderness.. Lymphatic No adneopathy. No adenopathy. No adenopathy. Musculoskeletal Adexa without tenderness or enlargement.. Digits and nails w/o clubbing, cyanosis, infection, petechiae, ischemia, or inflammatory conditions.. Integumentary (Hair, Skin) No suspicious lesions. No crepitus or fluctuance. No peri-wound warmth or erythema. No masses.Marland Kitchen Psychiatric Judgement and insight Intact.. No evidence of depression, anxiety, or agitation.. Notes There is no evidence of any cellulitis and the massive lymphedema which is stage III persists. There are no open weeping wounds. Electronic Signature(s) Signed: 02/24/2015 2:12:04 PM By: Christin Fudge MD, FACS Entered By: Christin Fudge on 02/24/2015 [name], [name] ([id number]) -------------------------------------------------------------------------------- Physician Orders Details Patient Name: [name]. Date of Service: 02/24/2015 1:45 PM Medical Record Number: [id number] Patient Account Number: [id number] Date of Birth/Sex: Sep 21, 1955 (59 y.o. Female) Treating RN:  Cornell Barman Primary Care Physician: Orlene Erm Other Clinician: Referring Physician: Orlene Erm Treating Physician/Extender: Frann Rider in Treatment: 4 Verbal / Phone Orders: No Diagnosis Coding Discharge From [facility] o Discharge from [place] - treatment complete Electronic Signature(s) Signed: 02/24/2015 4:25:28 PM By: Christin Fudge MD, FACS Signed: 02/24/2015 4:47:08 PM By: Gretta Cool RN, BSN, Kim RN, BSN Entered By: Gretta Cool, RN, BSN, Kim on 02/24/2015 14:06:13 Brem, [name] ([id number]) -------------------------------------------------------------------------------- Problem List Details Patient Name: [name], [name]. Date of Service: 02/24/2015 1:45 PM Medical Record Number: [id number] Patient Account Number: [id number] Date of Birth/Sex: Aug 06, 1955 (59 y.o. Female) Treating RN: Cornell Barman Primary Care Physician: Orlene Erm Other Clinician: Referring Physician: Orlene Erm Treating Physician/Extender: Frann Rider in Treatment: 4 Active Problems ICD-10 Encounter Code Description Active Date Diagnosis E11.622 Type 2 diabetes mellitus with other skin ulcer 01/21/2015 Yes I89.0 Lymphedema, not elsewhere classified 01/21/2015 Yes E66.01 Morbid (severe) obesity due to excess calories 01/21/2015 Yes L03.115 Cellulitis of right lower limb 01/21/2015 Yes Inactive Problems Resolved Problems Electronic Signature(s) Signed: 02/24/2015 2:08:21 PM By: Christin Fudge MD, FACS Entered By: Christin Fudge on 02/24/2015 14:08:21 Townsend, [name] ([id number]) -------------------------------------------------------------------------------- Progress Note Details Patient Name: [name]. Date of Service: 02/24/2015 1:45 PM Medical Record Number: [id number] Patient Account Number: [id number] Date of Birth/Sex: Jul 15, 1955 (59 y.o. Female) Treating RN: Cornell Barman Primary Care Physician: Orlene Erm Other Clinician: Referring  Physician: Orlene Erm Treating Physician/Extender: Frann Rider in Treatment: 4 Subjective Chief Complaint Information obtained from Patient Patient presents to the wound care center for a consult due non healing wound. 59 year old patient was known to the wound clinic from previous visits returns with massive lymphedema of the right lower extremity associated with redness and weeping for about 2 weeks History of Present Illness (HPI) The following HPI elements were documented for the patient's wound: Location: right lower extremity Quality: Patient reports experiencing a dull pain to affected area(s). Severity: Patient states wound are getting worse. Duration: Patient has had the wound for < 2 weeks prior to presenting for treatment Timing: Pain in wound is Intermittent (comes and goes Context: The wound appeared gradually over time Modifying Factors:  Consults to this date include:seen by her PCP and recently put on clindamycin Associated Signs and Symptoms: Patient reports having difficulty standing for long periods. The patient is a 59 year old female with history of hypertension and a long-standing history of bilateral lower extremity lymphedema (first presented on 4/2) . She has had open ulcers in the past which have always responded to compression therapy. She had briefly been to a lymphedema clinic in the past which helped her at the time. this time around she stopped treatment of her lymphedema pumps approximately 2 weeks ago because of some pain in the knees and then noticed the right leg getting worse. She was seen by her PCP who put her on clindamycin 4 times a day 2 days ago. The patient has seen [facility] and Dr. Delana Meyer had seen her last year where a vascular study including venous and arterial duplex studies were within normal limits. he had recommended compression stockings and lymphedema pumps and the patient has been using this in about 2 weeks ago. She is  known to be diabetic but in the past few time she's gone to her primary care doctor her hemoglobin A1c has been normal. 02/11/2015 - after her last visit she took my advice and went to the ER regarding the progressive cellulitis of her right lower extremity and she was admitted between July 17 and 22nd. She received IV antibiotics and then was sent home on a course of steroid-induced and oral antibiotics. She has improved much since then. 02/17/2015 -- she has been doing fine and the weeping of her legs has remarkably gone down. She has no fresh issues. [name], [name] ([id number]) Objective Constitutional Pulse regular. Respirations normal and unlabored. Afebrile. Vitals Time Taken: 1:49 PM, Height: 63 in, Weight: 379.6 lbs, BMI: 67.2, Temperature: 98.2 F, Pulse: 74 bpm, Respiratory Rate: 22 breaths/min, Blood Pressure: 130/66 mmHg. Eyes Nonicteric. Reactive to light. Ears, Nose, Mouth, and Throat Lips, teeth, and gums WNL.Marland Kitchen Moist mucosa without lesions . Neck supple and nontender. No palpable supraclavicular or cervical adenopathy. Normal sized without goiter. Respiratory WNL. No retractions.. Breath sounds WNL, No rubs, rales, rhonchi, or wheeze.. Cardiovascular Heart rhythm and rate regular, no murmur or gallop.. Pedal Pulses WNL. massive stage III lymphedema both lower extremities but no evidence of cellulitis.. Chest Breasts symmetical and no nipple discharge.. Breast tissue WNL, no masses, lumps, or tenderness.. Lymphatic No adneopathy. No adenopathy. No adenopathy. Musculoskeletal Adexa without tenderness or enlargement.. Digits and nails w/o clubbing, cyanosis, infection, petechiae, ischemia, or inflammatory conditions.Marland Kitchen Psychiatric Judgement and insight Intact.. No evidence of depression, anxiety, or agitation.. General Notes: There is no evidence of any cellulitis and the massive lymphedema which is stage III persists. There are no open weeping wounds. Integumentary  (Hair, Skin) No suspicious lesions. No crepitus or fluctuance. No peri-wound warmth or erythema. No masses.Marland Kitchen [name], [name] ([id number]) Other Condition(s) Patient presents with Lymphedema located on the Right Leg. The skin appearance exhibited: Moist, Mottled. The skin appearance did not exhibit: Atrophie Blanche, Callus, Crepitus, Cyanosis, Dry/Scaly, Ecchymosis, Erythema, Excoriation, Fluctuance, Friable, Hemosiderin Staining, Induration, Localized Edema, Maceration, Pallor, Rash, Rubor, Scarring. Skin temperature was noted as No Abnormality. Assessment Active Problems ICD-10 E11.622 - Type 2 diabetes mellitus with other skin ulcer I89.0 - Lymphedema, not elsewhere classified E66.01 - Morbid (severe) obesity due to excess calories L03.115 - Cellulitis of right lower limb I have recommended she works with her lymphedema pumps twice a day and if she so desires we will refer her to the lymphedema  clinic. She however informs me that she has worked with the lymphedema clinic for several years before and does exactly what they have taught her. She will continue to closely monitor her cellulitis and come back and see as an a when necessary basis. She is been discharged from the wound care services. Plan Discharge From [facility]: Discharge from [place] - treatment complete I have recommended she works with her lymphedema pumps twice a day and if she so desires we will refer her to the lymphedema clinic. She however informs me that she has worked with the lymphedema clinic for several years before and does exactly what they have taught her. She will continue to closely monitor her cellulitis and come back and see as an a when necessary basis. She is been discharged from the wound care services. NEOSHA, [name] ([id number]) Electronic Signature(s) Signed: 02/24/2015 4:31:36 PM By: Christin Fudge MD, FACS Previous Signature: 02/24/2015 2:12:54 PM Version By: Christin Fudge MD,  FACS Entered By: Christin Fudge on 02/24/2015 16:31:35 [name] ([id number]) -------------------------------------------------------------------------------- SuperBill Details Patient Name: [name]. Date of Service: 02/24/2015 Medical Record Number: [id number] Patient Account Number: [id number] Date of Birth/Sex: 19-Oct-1955 (59 y.o. Female) Treating RN: Cornell Barman Primary Care Physician: Orlene Erm Other Clinician: Referring Physician: Orlene Erm Treating Physician/Extender: Frann Rider in Treatment: 4 Diagnosis Coding ICD-10 Codes Code Description E11.622 Type 2 diabetes mellitus with other skin ulcer I89.0 Lymphedema, not elsewhere classified E66.01 Morbid (severe) obesity due to excess calories L03.115 Cellulitis of right lower limb Facility Procedures CPT4 Code: [id number] Description: [phone] - WOUND CARE VISIT-LEV 2 EST PT Modifier: Quantity: 1 Physician Procedures CPT4 Code: [id number] Description: 02774 - WC PHYS LEVEL 3 - EST PT ICD-10 Description Diagnosis E11.622 Type 2 diabetes mellitus with other skin ulc I89.0 Lymphedema, not elsewhere classified E66.01 Morbid (severe) obesity due to excess calori Modifier: er es Quantity: 1 Engineer, maintenance) Signed: 02/24/2015 2:13:20 PM By: Christin Fudge MD, FACS Entered By: Christin Fudge on 02/24/2015 14:13:20

## 2015-05-17 DIAGNOSIS — Z1211 Encounter for screening for malignant neoplasm of colon: Secondary | ICD-10-CM | POA: Insufficient documentation

## 2016-06-08 ENCOUNTER — Encounter: Payer: Self-pay | Admitting: Emergency Medicine

## 2016-06-08 ENCOUNTER — Emergency Department
Admission: EM | Admit: 2016-06-08 | Discharge: 2016-06-08 | Disposition: A | Discharge status: Home / Self Care | Payer: Medicaid Other | Attending: Emergency Medicine | Admitting: Emergency Medicine

## 2016-06-08 DIAGNOSIS — I10 Essential (primary) hypertension: Secondary | ICD-10-CM | POA: Insufficient documentation

## 2016-06-08 DIAGNOSIS — J069 Acute upper respiratory infection, unspecified: Secondary | ICD-10-CM | POA: Diagnosis not present

## 2016-06-08 DIAGNOSIS — F17290 Nicotine dependence, other tobacco product, uncomplicated: Secondary | ICD-10-CM | POA: Diagnosis not present

## 2016-06-08 DIAGNOSIS — Z79899 Other long term (current) drug therapy: Secondary | ICD-10-CM | POA: Diagnosis not present

## 2016-06-08 DIAGNOSIS — J029 Acute pharyngitis, unspecified: Secondary | ICD-10-CM | POA: Diagnosis present

## 2016-06-08 DIAGNOSIS — J45909 Unspecified asthma, uncomplicated: Secondary | ICD-10-CM | POA: Diagnosis not present

## 2016-06-08 MED ORDER — TRIAMCINOLONE ACETONIDE 55 MCG/ACT NA AERO: 1.0000 | INHALATION_SPRAY | Freq: Two times a day (BID) | NASAL | 0 refills | Status: DC

## 2016-06-08 MED ORDER — CETIRIZINE HCL 10 MG PO TABS: 10.0000 mg | ORAL_TABLET | Freq: Every day | ORAL | 0 refills | Status: DC

## 2016-06-08 NOTE — ED Provider Notes (Signed)
[facility] Emergency Department Provider Note  ____________________________________________   First MD Initiated Contact with Patient 06/08/16 1141     (approximate)  I have reviewed the triage vital signs and the nursing notes.   HISTORY  Chief Complaint Sore Throat   HPI [name] is a 60 y.o. female is here with complaint of irritated throat and sinus drainage. Patient states that today her voice became hoarse. She is unaware of any fever or chills. She has not been coughing. She states that in the past she had been taking Zyrtec and Flonase however accommodation caused her nose to bleed. Her doctor then switched her to Nasocort nose spray which has done well but she is completely out.  Currently she rates her pain as 6 out of 10.   Past Medical History:  Diagnosis Date  . Arthritis   . Asthma   . Enlarged heart   . Hypertension   . Sleep apnea     Patient Active Problem List   Diagnosis Date Noted  . Cellulitis 01/24/2015  . Cellulitis of right leg 01/23/2015    History reviewed. No pertinent surgical history.  Prior to Admission medications   Medication Sig Start Date End Date Taking? Authorizing Provider  acetaminophen (TYLENOL) 500 MG tablet Take 500 mg by mouth every 6 (six) hours as needed.    Historical Provider, MD  albuterol (PROVENTIL HFA;VENTOLIN HFA) 108 (90 BASE) MCG/ACT inhaler Inhale 2 puffs into the lungs every 6 (six) hours as needed for wheezing or shortness of breath.    Historical Provider, MD  amLODipine (NORVASC) 2.5 MG tablet Take 2.5 mg by mouth daily.    Historical Provider, MD  atenolol (TENORMIN) 50 MG tablet Take 50 mg by mouth daily.    Historical Provider, MD  cetirizine (ZYRTEC) 10 MG tablet Take 1 tablet (10 mg total) by mouth daily. 06/08/16   Johnn Hai, PA-C  gabapentin (NEURONTIN) 300 MG capsule Take 300 mg by mouth daily as needed.    Historical Provider, MD  mometasone (NASONEX) 50 MCG/ACT  nasal spray Place 2 sprays into the nose 2 (two) times daily as needed.    Historical Provider, MD  triamcinolone (NASACORT AQ) 55 MCG/ACT AERO nasal inhaler Place 1 spray into the nose 2 (two) times daily. 06/08/16 06/08/17  Johnn Hai, PA-C    Allergies Ace inhibitors and Ibuprofen  Family History  Problem Relation Age of Onset  . Thyroid disease Other     Social History Social History  Substance Use Topics  . Smoking status: Former Research scientist (life sciences)  . Smokeless tobacco: Current User    Types: Snuff  . Alcohol use No    Review of Systems Constitutional: No fever/chills ENT: Positive posterior drainage. Positive nasal congestion. Cardiovascular: Denies chest pain. Respiratory: Denies shortness of breath. Gastrointestinal: No abdominal pain.  No nausea, no vomiting.  Musculoskeletal: Negative for back pain. Patient has chronic lymphedema and has chronic edematous lower extremities. Skin: Negative for rash. Neurological: Negative for headaches, focal weakness or numbness.  10-point ROS otherwise negative.  ____________________________________________   PHYSICAL EXAM:  VITAL SIGNS: ED Triage Vitals  Enc Vitals Group     BP 06/08/16 1122 (!) 160/91     Pulse Rate 06/08/16 1122 93     Resp 06/08/16 1122 18     Temp 06/08/16 1122 98.9 F (37.2 C)     Temp Source 06/08/16 1122 Oral     SpO2 06/08/16 1122 94 %  Weight 06/08/16 1123 (!) 370 lb (167.8 kg)     Height 06/08/16 1123 5' 3.5" (1.613 m)     Head Circumference --      Peak Flow --      Pain Score 06/08/16 1123 6     Pain Loc --      Pain Edu? --      Excl. in [place]? --     Constitutional: Alert and oriented. Well appearing and in no acute distress. Eyes: Conjunctivae are normal. PERRL. EOMI. Head: Atraumatic. Nose: Moderate congestion/rhinnorhea.  EACs are clear bilaterally. TMs are dull without erythema or injection. Mouth/Throat: Mucous membranes are moist.  Oropharynx non-erythematous. Neck: No stridor.     Hematological/Lymphatic/Immunilogical: No cervical lymphadenopathy. Cardiovascular: Normal rate, regular rhythm. Grossly normal heart sounds.  Good peripheral circulation. Respiratory: Normal respiratory effort.  No retractions. Lungs CTAB. Musculoskeletal: Lower extremities with chronic edema. Patient is able to ambulate without assistance. Patient moves upper extremities without any difficulties. Neurologic:  Normal speech and language. No gross focal neurologic deficits are appreciated. No gait instability. Skin:  Skin is warm, dry and intact. No rash noted. Psychiatric: Mood and affect are normal. Speech and behavior are normal.  ____________________________________________   LABS (all labs ordered are listed, but only abnormal results are displayed)  Labs Reviewed - No data to display   PROCEDURES  Procedure(s) performed: None  Procedures  Critical Care performed: No  ____________________________________________   INITIAL IMPRESSION / ASSESSMENT AND PLAN / ED COURSE  Pertinent labs & imaging results that were available during my care of the patient were reviewed by me and considered in my medical decision making (see chart for details).    Clinical Course    Patient is given prescription for Nasacort nasal spray along with Zyrtec 10 mg one daily. Patient was also encouraged to use saline nose spray as needed for nasal congestion. She is to follow-up with her primary care doctor at [facility] if any continued problems.  ____________________________________________   FINAL CLINICAL IMPRESSION(S) / ED DIAGNOSES  Final diagnoses:  Acute upper respiratory infection      NEW MEDICATIONS STARTED DURING THIS VISIT:  New Prescriptions   CETIRIZINE (ZYRTEC) 10 MG TABLET    Take 1 tablet (10 mg total) by mouth daily.   TRIAMCINOLONE (NASACORT AQ) 55 MCG/ACT AERO NASAL INHALER    Place 1 spray into the nose 2 (two) times daily.     Note:  This document was  prepared using Dragon voice recognition software and may include unintentional dictation errors.    Johnn Hai, PA-C 06/08/16 1516    Rudene Re, MD 06/10/16 [phone]

## 2016-06-08 NOTE — Discharge Instructions (Signed)
Begin using Nasacort nasal spray along with Zyrtec as directed. Use saline nasal spray as needed for nasal congestion. Follow-up with your primary care doctor if any continued problems.

## 2016-06-08 NOTE — ED Triage Notes (Signed)
Patient presents to the ED with "irritated throat".  Patient reports sinus drainage and hoarse voice.  Patient is speaking in full sentences and is maintaining secretions well.  Patient is in no obvious distress at this time.

## 2016-08-03 ENCOUNTER — Inpatient Hospital Stay
Admission: EM | Admit: 2016-08-03 | Discharge: 2016-08-07 | DRG: 603 | Disposition: A | Discharge status: Home / Self Care | Payer: Medicaid Other | Attending: Internal Medicine | Admitting: Internal Medicine

## 2016-08-03 ENCOUNTER — Emergency Department: Payer: Medicaid Other

## 2016-08-03 ENCOUNTER — Encounter: Payer: Self-pay | Admitting: Emergency Medicine

## 2016-08-03 DIAGNOSIS — F17220 Nicotine dependence, chewing tobacco, uncomplicated: Secondary | ICD-10-CM | POA: Diagnosis present

## 2016-08-03 DIAGNOSIS — J45909 Unspecified asthma, uncomplicated: Secondary | ICD-10-CM | POA: Diagnosis present

## 2016-08-03 DIAGNOSIS — G4733 Obstructive sleep apnea (adult) (pediatric): Secondary | ICD-10-CM | POA: Diagnosis present

## 2016-08-03 DIAGNOSIS — Z888 Allergy status to other drugs, medicaments and biological substances status: Secondary | ICD-10-CM

## 2016-08-03 DIAGNOSIS — Z79899 Other long term (current) drug therapy: Secondary | ICD-10-CM

## 2016-08-03 DIAGNOSIS — L03115 Cellulitis of right lower limb: Principal | ICD-10-CM | POA: Diagnosis present

## 2016-08-03 DIAGNOSIS — J4520 Mild intermittent asthma, uncomplicated: Secondary | ICD-10-CM | POA: Diagnosis present

## 2016-08-03 DIAGNOSIS — L03116 Cellulitis of left lower limb: Secondary | ICD-10-CM

## 2016-08-03 DIAGNOSIS — I890 Lymphedema, not elsewhere classified: Secondary | ICD-10-CM

## 2016-08-03 DIAGNOSIS — I10 Essential (primary) hypertension: Secondary | ICD-10-CM | POA: Diagnosis present

## 2016-08-03 HISTORY — DX: Lymphedema, not elsewhere classified: I89.0

## 2016-08-03 LAB — CBC WITH DIFFERENTIAL/PLATELET
Basophils Absolute: 0 10*3/uL (ref 0–0.1)
Basophils Relative: 1 %
Eosinophils Absolute: 0 10*3/uL (ref 0–0.7)
Eosinophils Relative: 0 %
HEMATOCRIT: 41.5 % (ref 35.0–47.0)
Hemoglobin: 13.2 g/dL (ref 12.0–16.0)
Lymphocytes Relative: 25 %
Lymphs Abs: 2.2 10*3/uL (ref 1.0–3.6)
MCH: 25.8 pg — ABNORMAL LOW (ref 26.0–34.0)
MCHC: 31.9 g/dL — ABNORMAL LOW (ref 32.0–36.0)
MCV: 80.7 fL (ref 80.0–100.0)
MONO ABS: 0.9 10*3/uL (ref 0.2–0.9)
Monocytes Relative: 10 %
NEUTROS ABS: 5.9 10*3/uL (ref 1.4–6.5)
Neutrophils Relative %: 64 %
Platelets: 204 10*3/uL (ref 150–440)
RBC: 5.14 MIL/uL (ref 3.80–5.20)
RDW: 15.4 % — ABNORMAL HIGH (ref 11.5–14.5)
WBC: 9 10*3/uL (ref 3.6–11.0)

## 2016-08-03 LAB — BASIC METABOLIC PANEL
Anion gap: 6 (ref 5–15)
BUN: 16 mg/dL (ref 6–20)
CO2: 31 mmol/L (ref 22–32)
CREATININE: 0.99 mg/dL (ref 0.44–1.00)
Calcium: 9.1 mg/dL (ref 8.9–10.3)
Chloride: 102 mmol/L (ref 101–111)
GFR calc Af Amer: >60 mL/min (ref >60)
GFR calc non Af Amer: >60 mL/min (ref >60)
GLUCOSE: 83 mg/dL (ref 65–99)
Potassium: 4.8 mmol/L (ref 3.5–5.1)
Sodium: 139 mmol/L (ref 135–145)

## 2016-08-03 MED ORDER — SODIUM CHLORIDE 0.9 % IV SOLN
3.0000 g | Freq: Once | INTRAVENOUS | Status: AC
  Administered 2016-08-03: 3 g via INTRAVENOUS
  Filled 2016-08-03: qty 3

## 2016-08-03 MED ORDER — OXYCODONE-ACETAMINOPHEN 5-325 MG PO TABS: 2.0000 | ORAL_TABLET | Freq: Four times a day (QID) | ORAL | 0 refills | Status: DC | PRN

## 2016-08-03 MED ORDER — CLINDAMYCIN HCL 300 MG PO CAPS: 300.0000 mg | ORAL_CAPSULE | Freq: Three times a day (TID) | ORAL | 0 refills | Status: DC

## 2016-08-03 NOTE — ED Notes (Signed)
Patient transported to Ultrasound 

## 2016-08-03 NOTE — ED Triage Notes (Signed)
Has history of lymphedema in legs.  Seen by PCP on Monday and today and sent patient to ED for evaluation for cellulitis. Patient also describes increased drainage from behind knee for the past week.

## 2016-08-03 NOTE — ED Provider Notes (Addendum)
[facility] Emergency Department Provider Note        Time seen: ----------------------------------------- 8:45 PM on 08/03/2016 -----------------------------------------    I have reviewed the triage vital signs and the nursing notes.   HISTORY  Chief Complaint Cellulitis    HPI [name] is a 61 y.o. female who presents to the ER for draining wound from the right leg. Patient has history of lymphedema, seen by the primary care doctor on Monday and today was sent to the ER for evaluation for concerns of cellulitis. She describes increased drainage from around the right lower leg the past week. She denies fevers or chills, does have some pain 7 out of 10 in the right leg.   Past Medical History:  Diagnosis Date  . Arthritis   . Asthma   . Enlarged heart   . Hypertension   . Sleep apnea     Patient Active Problem List   Diagnosis Date Noted  . Cellulitis 01/24/2015  . Cellulitis of right leg 01/23/2015    History reviewed. No pertinent surgical history.  Allergies Ace inhibitors and Ibuprofen  Social History Social History  Substance Use Topics  . Smoking status: Former Research scientist (life sciences)  . Smokeless tobacco: Current User    Types: Snuff  . Alcohol use No    Review of Systems Constitutional: Negative for fever. Cardiovascular: Negative for chest pain. Respiratory: Negative for shortness of breath. Gastrointestinal: Negative for abdominal pain, vomiting and diarrhea. Genitourinary: Negative for dysuria. Musculoskeletal: Positive right leg pain and drainage Skin: positive for right leg erythema Neurological: Negative for headaches, focal weakness or numbness.  10-point ROS otherwise negative.  ____________________________________________   PHYSICAL EXAM:  VITAL SIGNS: ED Triage Vitals  Enc Vitals Group     BP 08/03/16 1812 136/83     Pulse Rate 08/03/16 1812 61     Resp 08/03/16 1812 18     Temp 08/03/16 1812 98.2 F  (36.8 C)     Temp Source 08/03/16 1812 Oral     SpO2 08/03/16 1812 100 %     Weight 08/03/16 1812 (!) 390 lb (176.9 kg)     Height 08/03/16 1812 5\' 4"  (1.626 m)     Head Circumference --      Peak Flow --      Pain Score 08/03/16 1811 0     Pain Loc --      Pain Edu? --      Excl. in [place]? --     Constitutional: Alert and oriented. Well appearing and in no distress. Eyes: Conjunctivae are normal. PERRL. Normal extraocular movements. ENT   Head: Normocephalic and atraumatic.   Nose: No congestion/rhinnorhea.   Mouth/Throat: Mucous membranes are moist.   Neck: No stridor. Cardiovascular: Normal rate, regular rhythm. No murmurs, rubs, or gallops. Respiratory: Normal respiratory effort without tachypnea nor retractions. Breath sounds are clear and equal bilaterally. No wheezes/rales/rhonchi. Gastrointestinal: Soft and nontender. Normal bowel sounds Musculoskeletal: Marked bilateral leg chronic lymphedema, some erythema is noted medially on the right lower leg Neurologic:  Normal speech and language. No gross focal neurologic deficits are appreciated.  Skin:  Erythema is noted to the right lower leg medially, below the knee Psychiatric: Mood and affect are normal. Speech and behavior are normal.  ____________________________________________  ED COURSE:  Pertinent labs & imaging results that were available during my care of the patient were reviewed by me and considered in my medical decision making (see chart for details). Patient arrives to the ER  in no distress. We will check basic labs, give IV antibiotics reevaluate.   Procedures ____________________________________________   LABS (pertinent positives/negatives)  Labs Reviewed  CBC WITH DIFFERENTIAL/PLATELET - Abnormal; Notable for the following:       Result Value   MCH 25.8 (*)    MCHC 31.9 (*)    RDW 15.4 (*)    All other components within normal limits  AEROBIC/ANAEROBIC CULTURE (SURGICAL/DEEP WOUND)   BASIC METABOLIC PANEL    RADIOLOGY  Right lower extremity ultrasound Difficult study but no obvious DVT ____________________________________________  FINAL ASSESSMENT AND PLAN  Lymphedema, cellulitis  Plan: Patient with labs and imaging as dictated above. Patient presented to the ER with lymphedema and cellulitis. Her labs are reassuring, as she has extensive cellulitis in her right leg with continuous wound drainage. We have obtained a wound culture, she has no home health or nursing care set up for her at home. I will discuss with the hospitalist for admission.   Earleen Newport, MD   Note: This note was generated in part or whole with voice recognition software. Voice recognition is usually quite accurate but there are transcription errors that can and very often do occur. I apologize for any typographical errors that were not detected and corrected.     Earleen Newport, MD 08/03/16 [place], MD 08/03/16 [phone]

## 2016-08-03 NOTE — ED Notes (Signed)
Sent a red tube per pt request bc pt is very hard to get blood from was stuck 4 times in triage.  there is a red tube at the lab.

## 2016-08-04 ENCOUNTER — Encounter: Payer: Self-pay | Admitting: Internal Medicine

## 2016-08-04 DIAGNOSIS — J4520 Mild intermittent asthma, uncomplicated: Secondary | ICD-10-CM | POA: Diagnosis present

## 2016-08-04 DIAGNOSIS — G4733 Obstructive sleep apnea (adult) (pediatric): Secondary | ICD-10-CM | POA: Diagnosis present

## 2016-08-04 DIAGNOSIS — Z79899 Other long term (current) drug therapy: Secondary | ICD-10-CM | POA: Diagnosis not present

## 2016-08-04 DIAGNOSIS — J45909 Unspecified asthma, uncomplicated: Secondary | ICD-10-CM | POA: Diagnosis present

## 2016-08-04 DIAGNOSIS — L03115 Cellulitis of right lower limb: Secondary | ICD-10-CM | POA: Diagnosis not present

## 2016-08-04 DIAGNOSIS — L03116 Cellulitis of left lower limb: Secondary | ICD-10-CM | POA: Diagnosis not present

## 2016-08-04 DIAGNOSIS — I10 Essential (primary) hypertension: Secondary | ICD-10-CM | POA: Diagnosis present

## 2016-08-04 DIAGNOSIS — Z888 Allergy status to other drugs, medicaments and biological substances status: Secondary | ICD-10-CM | POA: Diagnosis not present

## 2016-08-04 DIAGNOSIS — I890 Lymphedema, not elsewhere classified: Secondary | ICD-10-CM

## 2016-08-04 DIAGNOSIS — F17220 Nicotine dependence, chewing tobacco, uncomplicated: Secondary | ICD-10-CM | POA: Diagnosis present

## 2016-08-04 LAB — BASIC METABOLIC PANEL
ANION GAP: 6 (ref 5–15)
BUN: 15 mg/dL (ref 6–20)
CO2: 29 mmol/L (ref 22–32)
Calcium: 8.4 mg/dL — ABNORMAL LOW (ref 8.9–10.3)
Chloride: 105 mmol/L (ref 101–111)
Creatinine, Ser: 0.86 mg/dL (ref 0.44–1.00)
GFR calc non Af Amer: >60 mL/min (ref >60)
Glucose, Bld: 127 mg/dL — ABNORMAL HIGH (ref 65–99)
Potassium: 3.8 mmol/L (ref 3.5–5.1)
Sodium: 140 mmol/L (ref 135–145)

## 2016-08-04 LAB — CBC
HCT: 35.2 % (ref 35.0–47.0)
HEMOGLOBIN: 11.8 g/dL — AB (ref 12.0–16.0)
MCH: 26.7 pg (ref 26.0–34.0)
MCHC: 33.5 g/dL (ref 32.0–36.0)
MCV: 79.7 fL — AB (ref 80.0–100.0)
Platelets: 187 10*3/uL (ref 150–440)
RBC: 4.42 MIL/uL (ref 3.80–5.20)
RDW: 15.2 % — ABNORMAL HIGH (ref 11.5–14.5)
WBC: 7.8 10*3/uL (ref 3.6–11.0)

## 2016-08-04 MED ORDER — ALBUTEROL SULFATE (2.5 MG/3ML) 0.083% IN NEBU: 3.0000 mL | INHALATION_SOLUTION | Freq: Four times a day (QID) | RESPIRATORY_TRACT | Status: DC | PRN

## 2016-08-04 MED ORDER — VANCOMYCIN HCL IN DEXTROSE 1-5 GM/200ML-% IV SOLN: 1000.0000 mg | Freq: Once | INTRAVENOUS | Status: DC

## 2016-08-04 MED ORDER — OXYCODONE HCL 5 MG PO TABS
5.0000 mg | ORAL_TABLET | ORAL | Status: DC | PRN
  Administered 2016-08-04 – 2016-08-05 (×4): 5 mg via ORAL
  Filled 2016-08-04 (×4): qty 1

## 2016-08-04 MED ORDER — PIPERACILLIN-TAZOBACTAM 3.375 G IVPB
3.3750 g | Freq: Three times a day (TID) | INTRAVENOUS | Status: AC
  Administered 2016-08-04: 3.375 g via INTRAVENOUS
  Filled 2016-08-04: qty 50

## 2016-08-04 MED ORDER — ATENOLOL 50 MG PO TABS
50.0000 mg | ORAL_TABLET | Freq: Every day | ORAL | Status: DC
  Administered 2016-08-04 – 2016-08-07 (×4): 50 mg via ORAL
  Filled 2016-08-04 (×4): qty 1

## 2016-08-04 MED ORDER — ENOXAPARIN SODIUM 40 MG/0.4ML ~~LOC~~ SOLN
40.0000 mg | Freq: Two times a day (BID) | SUBCUTANEOUS | Status: DC
  Administered 2016-08-04 – 2016-08-07 (×7): 40 mg via SUBCUTANEOUS
  Filled 2016-08-04 (×8): qty 0.4

## 2016-08-04 MED ORDER — AMLODIPINE BESYLATE 5 MG PO TABS
2.5000 mg | ORAL_TABLET | Freq: Every day | ORAL | Status: DC
  Administered 2016-08-04 – 2016-08-07 (×4): 2.5 mg via ORAL
  Filled 2016-08-04 (×4): qty 1

## 2016-08-04 MED ORDER — ACETAMINOPHEN 325 MG PO TABS: 650.0000 mg | ORAL_TABLET | Freq: Four times a day (QID) | ORAL | Status: DC | PRN

## 2016-08-04 MED ORDER — ONDANSETRON HCL 4 MG/2ML IJ SOLN: 4.0000 mg | Freq: Four times a day (QID) | INTRAMUSCULAR | Status: DC | PRN

## 2016-08-04 MED ORDER — ACETAMINOPHEN 650 MG RE SUPP: 650.0000 mg | Freq: Four times a day (QID) | RECTAL | Status: DC | PRN

## 2016-08-04 MED ORDER — PIPERACILLIN-TAZOBACTAM 3.375 G IVPB 30 MIN
3.3750 g | Freq: Once | INTRAVENOUS | Status: DC
  Filled 2016-08-04: qty 50

## 2016-08-04 MED ORDER — VANCOMYCIN HCL 10 G IV SOLR
1500.0000 mg | Freq: Once | INTRAVENOUS | Status: AC
  Administered 2016-08-04: 1500 mg via INTRAVENOUS
  Filled 2016-08-04: qty 1500

## 2016-08-04 MED ORDER — FUROSEMIDE 20 MG PO TABS: 20.0000 mg | ORAL_TABLET | Freq: Every day | ORAL | Status: DC | PRN

## 2016-08-04 MED ORDER — VANCOMYCIN HCL 10 G IV SOLR
1500.0000 mg | Freq: Two times a day (BID) | INTRAVENOUS | Status: DC
  Administered 2016-08-04 – 2016-08-07 (×6): 1500 mg via INTRAVENOUS
  Filled 2016-08-04 (×8): qty 1500

## 2016-08-04 MED ORDER — ONDANSETRON HCL 4 MG PO TABS: 4.0000 mg | ORAL_TABLET | Freq: Four times a day (QID) | ORAL | Status: DC | PRN

## 2016-08-04 NOTE — Progress Notes (Signed)
Lovenox changed to 40 mg BID for BMI >40 and CrCl >30. 

## 2016-08-04 NOTE — Progress Notes (Signed)
[name] NAME: [name]    MR#:  [id number]  DATE OF BIRTH:  July 31, 1955  SUBJECTIVE:   Patient reports she has had drainage from the right leg over the past few days.  REVIEW OF SYSTEMS:    Review of Systems  Constitutional: Negative.  Negative for chills, fever and malaise/fatigue.  HENT: Negative.  Negative for ear discharge, ear pain, hearing loss, nosebleeds and sore throat.   Eyes: Negative.  Negative for blurred vision and pain.  Respiratory: Negative.  Negative for cough, hemoptysis, shortness of breath and wheezing.   Cardiovascular: Positive for leg swelling. Negative for chest pain and palpitations.  Gastrointestinal: Negative.  Negative for abdominal pain, blood in stool, diarrhea, nausea and vomiting.  Genitourinary: Negative.  Negative for dysuria.  Musculoskeletal: Negative.  Negative for back pain.  Skin: Negative.        Oozing from right leg  Neurological: Negative for dizziness, tremors, speech change, focal weakness, seizures and headaches.  Endo/Heme/Allergies: Negative.  Does not bruise/bleed easily.  Psychiatric/Behavioral: Negative.  Negative for depression, hallucinations and suicidal ideas.    Tolerating Diet: yes      DRUG ALLERGIES:   Allergies  Allergen Reactions  . Ace Inhibitors Hives and Swelling  . Ibuprofen Hives and Swelling    VITALS:  Blood pressure 118/66, pulse 77, temperature 97.5 F (36.4 C), temperature source Oral, resp. rate 18, height 5\' 4"  (1.626 m), weight (!) 176.9 kg (390 lb), SpO2 95 %.  PHYSICAL EXAMINATION:   Physical Exam  Constitutional: She is oriented to person, place, and time and well-developed, well-nourished, and in no distress. No distress.  HENT:  Head: Normocephalic.  Eyes: No scleral icterus.  Neck: Normal range of motion. Neck supple. No JVD present. No tracheal deviation present.  Cardiovascular: Normal rate, regular rhythm and normal heart  sounds.  Exam reveals no gallop and no friction rub.   No murmur heard. Pulmonary/Chest: Effort normal and breath sounds normal. No respiratory distress. She has no wheezes. She has no rales. She exhibits no tenderness.  Abdominal: Soft. Bowel sounds are normal. She exhibits no distension and no mass. There is no tenderness. There is no rebound and no guarding.  Musculoskeletal: Normal range of motion. She exhibits no edema.  Neurological: She is alert and oriented to person, place, and time.  Skin: Skin is warm. No rash noted. No erythema.  Lower extremities with elephantiasis   I cannot find an ulcer but her right leg is warm and red with nonpurulrnt drainage from an area I cannot see due to edema.  Psychiatric: Affect and judgment normal.      LABORATORY PANEL:   CBC  Recent Labs Lab 08/04/16 0317  WBC 7.8  HGB 11.8*  HCT 35.2  PLT 187   ------------------------------------------------------------------------------------------------------------------  Chemistries   Recent Labs Lab 08/04/16 0317  NA 140  K 3.8  CL 105  CO2 29  GLUCOSE 127*  BUN 15  CREATININE 0.86  CALCIUM 8.4*   ------------------------------------------------------------------------------------------------------------------  Cardiac Enzymes No results for input(s): TROPONINI in the last 168 hours. ------------------------------------------------------------------------------------------------------------------  RADIOLOGY:  US Venous Img Lower Unilateral Right  Result Date: 08/04/2016 CLINICAL DATA:  61 year old female with right lower extremity swelling and redness. EXAM: Right LOWER EXTREMITY VENOUS DOPPLER ULTRASOUND TECHNIQUE: Gray-scale sonography with graded compression, as well as color Doppler and duplex ultrasound were performed to evaluate the lower extremity deep venous systems from the level of the common femoral vein and  including the common femoral, femoral, profunda femoral,  popliteal and calf veins including the posterior tibial, peroneal and gastrocnemius veins when visible. The superficial great saphenous vein was also interrogated. Spectral Doppler was utilized to evaluate flow at rest and with distal augmentation maneuvers in the common femoral, femoral and popliteal veins. COMPARISON:  Ultrasound dated 09/28/2013 FINDINGS: Contralateral Common Femoral Vein: Respiratory phasicity is normal and symmetric with the symptomatic side. No evidence of thrombus. Normal compressibility. Common Femoral Vein: No evidence of thrombus. Normal compressibility, respiratory phasicity and response to augmentation. Saphenofemoral Junction: No evidence of thrombus. Normal compressibility and flow on color Doppler imaging. Profunda Femoral Vein: No evidence of thrombus. Normal compressibility and flow on color Doppler imaging. Femoral Vein: The visualized proximal portion of the femoral vein appears patent. The distal portion of the femoral vein is not visualized due to patient's body habitus and lymphedema. Popliteal Vein: Not well visualized. Color flow noted within the popliteal vein. Calf Veins: Not seen Superficial Great Saphenous Vein: No evidence of thrombus. Normal compressibility and flow on color Doppler imaging. Venous Reflux:  None. Other Findings:  None. IMPRESSION: Limited evaluation due to patient's body habitus and lymphedema. The distal femoral vein, popliteal vein, and calf veins are not visualized. No DVT noted in the visualized proximal portion of the femoral vein or in the common femoral vein. Electronically Signed   By: Anner Crete M.D.   On: 08/04/2016 00:12     ASSESSMENT AND PLAN:   61 year old female with lower extremity elephantiasis/chronic lymphedema presents with cellulitis of right lower extremity.  1. Right lower extremity cellulitis: Continue vancomycin Patient unable to elevate right leg due to hip pain   2. Chronic lymphedema/elephantitis: Wound  care consult 3. OSA on CPAP  4. Essential hypertension: Continue Norvasc and atenolol  5. Mild intermittent asthma: Continue inhaler when necessary  7. Morbid obesity: Encouraged weight loss as tolerated.     Management plans discussed with the patient and she is in agreement.  CODE STATUS: FULL  TOTAL TIME TAKING CARE OF THIS PATIENT: 30 minutes.     POSSIBLE D/C 2 days, DEPENDING ON CLINICAL CONDITION.   Ridhima Golberg M.D on 08/04/2016 at 11:11 AM  Between 7am to 6pm - Pager - [phone] After 6pm go to [email or web address] - password [email or web address] [facility]  Office  [phone]  CC: Primary care physician; Leotis Shames, MD  Note: This dictation was prepared with Dragon dictation along with smaller phrase technology. Any transcriptional errors that result from this process are unintentional.

## 2016-08-04 NOTE — Progress Notes (Addendum)
Pharmacy Antibiotic Note  [name] is a 61 y.o. female admitted on 08/03/2016 with cellulitis.  Pharmacy has been consulted for vancomycin dosing.  Plan: DW 104kg  Vd 73L kei 0.087 hr-1  T1/2 8 hours Vancomycin 1500 mg q 12 hours ordered with stacked dosing. Level before 5th dose. Goal trough 15-20.    Height: 5\' 4"  (162.6 cm) Weight: (!) 390 lb (176.9 kg) IBW/kg (Calculated) : 54.7  Temp (24hrs), Avg:98.2 F (36.8 C), Min:98.2 F (36.8 C), Max:98.2 F (36.8 C)   Recent Labs Lab 08/03/16 1811  WBC 9.0  CREATININE 0.99    Estimated Creatinine Clearance: 98.8 mL/min (by C-G formula based on SCr of 0.99 mg/dL).    Allergies  Allergen Reactions  . Ace Inhibitors Hives and Swelling  . Ibuprofen Hives and Swelling    Antimicrobials this admission: Zosyn x1  >>  Vancomycin 1/27 >>   Dose adjustments this admission: 0128 PM dose given before level drawn. Will reschedule for 0129 AM.   Microbiology results: 1/27 BCx: pending 1/27 Wound Cx: pending   Thank you for allowing pharmacy to be a part of this patient's care.  Deatrice Spanbauer S 08/04/2016 3:03 AM

## 2016-08-04 NOTE — H&P (Signed)
[facility] [place] NAME: [name]    MR#:  [id number]  DATE OF BIRTH:  05-16-1956  DATE OF ADMISSION:  08/03/2016  PRIMARY CARE PHYSICIAN: Leotis Shames, MD   REQUESTING/REFERRING PHYSICIAN: Jimmye Norman, MD  CHIEF COMPLAINT:   Chief Complaint  Patient presents with  . Cellulitis    HISTORY OF PRESENT ILLNESS:  [name]  is a 61 y.o. female who presents with Right lower shunt he cellulitis. Patient has lymphedema in that leg and has had cellulitis like this before. The past 4-5 days this is been going on, she went to her primary care physician to try and get treatment, but does not have a very clear story as to what happened there. Here in the ED tonight she clearly has cellulitis with some blistering and weeping. Hospitalists were called for admission  PAST MEDICAL HISTORY:   Past Medical History:  Diagnosis Date  . Arthritis   . Asthma   . Enlarged heart   . Hypertension   . Lymphedema   . Sleep apnea     PAST SURGICAL HISTORY:   Past Surgical History:  Procedure Laterality Date  . NO PAST SURGERIES      SOCIAL HISTORY:   Social History  Substance Use Topics  . Smoking status: Former Research scientist (life sciences)  . Smokeless tobacco: Current User    Types: Snuff  . Alcohol use No    FAMILY HISTORY:   Family History  Problem Relation Age of Onset  . Thyroid disease Other     DRUG ALLERGIES:   Allergies  Allergen Reactions  . Ace Inhibitors Hives and Swelling  . Ibuprofen Hives and Swelling    MEDICATIONS AT HOME:   Prior to Admission medications   Medication Sig Start Date End Date Taking? Authorizing Provider  amLODipine (NORVASC) 2.5 MG tablet Take 2.5 mg by mouth daily.   Yes Historical Provider, MD  atenolol (TENORMIN) 50 MG tablet Take 50 mg by mouth daily.   Yes Historical Provider, MD  furosemide (LASIX) 20 MG tablet Take 20 mg by mouth daily as needed for fluid.   Yes Historical Provider, MD   acetaminophen (TYLENOL) 500 MG tablet Take 500 mg by mouth every 6 (six) hours as needed.    Historical Provider, MD  albuterol (PROVENTIL HFA;VENTOLIN HFA) 108 (90 BASE) MCG/ACT inhaler Inhale 2 puffs into the lungs every 6 (six) hours as needed for wheezing or shortness of breath.    Historical Provider, MD  clindamycin (CLEOCIN) 300 MG capsule Take 1 capsule (300 mg total) by mouth 3 (three) times daily. 08/03/16   Earleen Newport, MD  oxyCODONE-acetaminophen (PERCOCET) 5-325 MG tablet Take 2 tablets by mouth every 6 (six) hours as needed for moderate pain or severe pain. 08/03/16   Earleen Newport, MD    REVIEW OF SYSTEMS:  Review of Systems  Constitutional: Negative for chills, fever, malaise/fatigue and weight loss.  HENT: Negative for ear pain, hearing loss and tinnitus.   Eyes: Negative for blurred vision, double vision, pain and redness.  Respiratory: Negative for cough, hemoptysis and shortness of breath.   Cardiovascular: Positive for leg swelling. Negative for chest pain, palpitations and orthopnea.  Gastrointestinal: Negative for abdominal pain, constipation, diarrhea, nausea and vomiting.  Genitourinary: Negative for dysuria, frequency and hematuria.  Musculoskeletal: Negative for back pain, joint pain and neck pain.  Skin: Positive for rash.  Neurological: Negative for dizziness, tremors, focal weakness and weakness.  Endo/Heme/Allergies: Negative for  polydipsia. Does not bruise/bleed easily.  Psychiatric/Behavioral: Negative for depression. The patient is not nervous/anxious and does not have insomnia.      VITAL SIGNS:   Vitals:   08/03/16 1812 08/03/16 2030 08/03/16 2200 08/03/16 2347  BP: 136/83 (!) 143/70 (!) 165/86 (!) 143/76  Pulse: 61 70 68 73  Resp: 18 20 20 20   Temp: 98.2 F (36.8 C)     TempSrc: Oral     SpO2: 100% 99% 99% 99%  Weight: (!) 176.9 kg (390 lb)     Height: 5\' 4"  (1.626 m)      Wt Readings from Last 3 Encounters:  08/03/16 (!) 176.9  kg (390 lb)  06/08/16 (!) 167.8 kg (370 lb)  01/23/15 (!) 179.6 kg (396 lb)    PHYSICAL EXAMINATION:  Physical Exam  Vitals reviewed. Constitutional: She is oriented to person, place, and time. She appears well-developed and well-nourished. No distress.  HENT:  Head: Normocephalic and atraumatic.  Mouth/Throat: Oropharynx is clear and moist.  Eyes: Conjunctivae and EOM are normal. Pupils are equal, round, and reactive to light. No scleral icterus.  Neck: Normal range of motion. Neck supple. No JVD present. No thyromegaly present.  Cardiovascular: Normal rate, regular rhythm and intact distal pulses.  Exam reveals no gallop and no friction rub.   No murmur heard. Respiratory: Effort normal and breath sounds normal. No respiratory distress. She has no wheezes. She has no rales.  GI: Soft. Bowel sounds are normal. She exhibits no distension. There is no tenderness.  Musculoskeletal: Normal range of motion. She exhibits edema (lower extremity right greater than left).  No arthritis, no gout  Lymphadenopathy:    She has no cervical adenopathy.  Neurological: She is alert and oriented to person, place, and time. No cranial nerve deficit.  No dysarthria, no aphasia  Skin: Skin is warm and dry. No rash noted. There is erythema (With some blistering wound and oozing to the right lower extremity).  Psychiatric: She has a normal mood and affect. Her behavior is normal. Judgment and thought content normal.    LABORATORY PANEL:   CBC  Recent Labs Lab 08/03/16 1811  WBC 9.0  HGB 13.2  HCT 41.5  PLT 204   ------------------------------------------------------------------------------------------------------------------  Chemistries   Recent Labs Lab 08/03/16 1811  NA 139  K 4.8  CL 102  CO2 31  GLUCOSE 83  BUN 16  CREATININE 0.99  CALCIUM 9.1   ------------------------------------------------------------------------------------------------------------------  Cardiac  Enzymes No results for input(s): TROPONINI in the last 168 hours. ------------------------------------------------------------------------------------------------------------------  RADIOLOGY:  US Venous Img Lower Unilateral Right  Result Date: 08/04/2016 CLINICAL DATA:  61 year old female with right lower extremity swelling and redness. EXAM: Right LOWER EXTREMITY VENOUS DOPPLER ULTRASOUND TECHNIQUE: Gray-scale sonography with graded compression, as well as color Doppler and duplex ultrasound were performed to evaluate the lower extremity deep venous systems from the level of the common femoral vein and including the common femoral, femoral, profunda femoral, popliteal and calf veins including the posterior tibial, peroneal and gastrocnemius veins when visible. The superficial great saphenous vein was also interrogated. Spectral Doppler was utilized to evaluate flow at rest and with distal augmentation maneuvers in the common femoral, femoral and popliteal veins. COMPARISON:  Ultrasound dated 09/28/2013 FINDINGS: Contralateral Common Femoral Vein: Respiratory phasicity is normal and symmetric with the symptomatic side. No evidence of thrombus. Normal compressibility. Common Femoral Vein: No evidence of thrombus. Normal compressibility, respiratory phasicity and response to augmentation. Saphenofemoral Junction: No evidence of thrombus. Normal compressibility and  flow on color Doppler imaging. Profunda Femoral Vein: No evidence of thrombus. Normal compressibility and flow on color Doppler imaging. Femoral Vein: The visualized proximal portion of the femoral vein appears patent. The distal portion of the femoral vein is not visualized due to patient's body habitus and lymphedema. Popliteal Vein: Not well visualized. Color flow noted within the popliteal vein. Calf Veins: Not seen Superficial Great Saphenous Vein: No evidence of thrombus. Normal compressibility and flow on color Doppler imaging. Venous Reflux:   None. Other Findings:  None. IMPRESSION: Limited evaluation due to patient's body habitus and lymphedema. The distal femoral vein, popliteal vein, and calf veins are not visualized. No DVT noted in the visualized proximal portion of the femoral vein or in the common femoral vein. Electronically Signed   By: Anner Crete M.D.   On: 08/04/2016 00:12    EKG:   Orders placed or performed in visit on 09/28/13  . EKG 12-Lead    IMPRESSION AND PLAN:  Principal Problem:   Cellulitis of right leg - IV antibiotics started in the ED and continued on admission. Patient has gotten what seems to be red man syndrome from vancomycin infusions before. We will order vancomycin as the patient is willing to give it a try again, but we will do so at a slow rate. Wound cultures sent from the ED, blood cultures also ordered as part of cellulitis order set Active Problems:   Lymphedema - defer to wound consult team recommendations for treatment of her cellulitis associated wound   OSA (obstructive sleep apnea) - CPAP daily at bedtime   HTN (hypertension) - continue home meds   Asthma - home dose when necessary inhaler  All the records are reviewed and case discussed with ED provider. Management plans discussed with the patient and/or family.  DVT PROPHYLAXIS: SubQ lovenox  GI PROPHYLAXIS: None  ADMISSION STATUS: Inpatient  CODE STATUS: Full Code Status History    Date Active Date Inactive Code Status Order ID Comments User Context   01/23/2015  9:14 PM 01/28/2015  5:17 PM Full Code [id number]  Lytle [place], MD ED      TOTAL TIME TAKING CARE OF THIS PATIENT: 45 minutes.    Manon Banbury [place] 08/04/2016, 1:19 AM  [facility]  Office  [phone]  CC: Primary care physician; Leotis Shames, MD

## 2016-08-05 LAB — CBC
HCT: 31.7 % — ABNORMAL LOW (ref 35.0–47.0)
Hemoglobin: 10.9 g/dL — ABNORMAL LOW (ref 12.0–16.0)
MCH: 26.9 pg (ref 26.0–34.0)
MCHC: 34.3 g/dL (ref 32.0–36.0)
MCV: 78.6 fL — AB (ref 80.0–100.0)
PLATELETS: 168 10*3/uL (ref 150–440)
RBC: 4.03 MIL/uL (ref 3.80–5.20)
RDW: 15.3 % — ABNORMAL HIGH (ref 11.5–14.5)
WBC: 7 10*3/uL (ref 3.6–11.0)

## 2016-08-05 LAB — BASIC METABOLIC PANEL
Anion gap: 6 (ref 5–15)
BUN: 17 mg/dL (ref 6–20)
CO2: 27 mmol/L (ref 22–32)
Calcium: 7.9 mg/dL — ABNORMAL LOW (ref 8.9–10.3)
Chloride: 105 mmol/L (ref 101–111)
Creatinine, Ser: 0.81 mg/dL (ref 0.44–1.00)
Glucose, Bld: 107 mg/dL — ABNORMAL HIGH (ref 65–99)
POTASSIUM: 4 mmol/L (ref 3.5–5.1)
Sodium: 138 mmol/L (ref 135–145)

## 2016-08-05 MED ORDER — HYDROCERIN EX CREA
TOPICAL_CREAM | Freq: Two times a day (BID) | CUTANEOUS | Status: DC
  Administered 2016-08-05 – 2016-08-07 (×3): via TOPICAL
  Filled 2016-08-05: qty 113

## 2016-08-05 NOTE — Consult Note (Signed)
[facility] Nurse wound consult note Reason for Consult: Intertriginous dermatitis with partial and full thickness skin loss in the presence of bilateral lymphedema.  Patient used lymphedema pump at home but has not used in the past three weeks.  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 She wears silver compression wraps on the distal 1/3 of her LEs. Wound type: venous insufficiency, lymphedema and intertriginous dermatitis Pressure Injury POA: No Measurement: Right posterior knee skin fold with depth of 3.5cm with partial thickness tissue loss and an area of full thickness tissue loss measuring 0.8cm x 1.5cm x 0.2cm at the medial edge Wound [id number], moist Drainage (amount, consistency, odor) Moderate to large amount of light yellow to serous exudate. Periwound:Macerated, erythematous around the posterior right knee, intact.  Bilateral LEs with skin changes consistent with venous insufficiency (hemosiderin staining, irregular contours and elevations). Patient obtained a roll of InterDry Ag+, but it was too late-the drainage was overwhelming and patient was a little unfamiliar with the product. Dressing procedure/placement/frequency: I will provide a bariatric therapeutic bed with low air loss feature and have provided Nursing with Guidance for skin care as well as for wound care. [facility] nursing team will not follow, but will remain available to this patient, the nursing and medical teams.  Please re-consult if needed. Thanks, Maudie Flakes, MSN, RN, [place], [facility]  Pager# [phone]

## 2016-08-05 NOTE — Progress Notes (Signed)
Dressings applied to lower extremities. Eucerin cream applied earlier to soak in. Interdry placed in skin folds behind left and right knee. Wrapped with silver compression wraps and Coban. Bariatric bed with low air loss feature called in to [facility] an hour ago. Waiting on bed.

## 2016-08-05 NOTE — Progress Notes (Signed)
[facility] at [place] NAME: [name]    MR#:  [id number]  DATE OF BIRTH:  [birth year]  SUBJECTIVE:  Erythema slowly improving. Drainage is less.  REVIEW OF SYSTEMS:    Review of Systems  Constitutional: Negative.  Negative for chills, fever and malaise/fatigue.  HENT: Negative.  Negative for ear discharge, ear pain, hearing loss, nosebleeds and sore throat.   Eyes: Negative.  Negative for blurred vision and pain.  Respiratory: Negative.  Negative for cough, hemoptysis, shortness of breath and wheezing.   Cardiovascular: Positive for leg swelling. Negative for chest pain and palpitations.  Gastrointestinal: Negative.  Negative for abdominal pain, blood in stool, diarrhea, nausea and vomiting.  Genitourinary: Negative.  Negative for dysuria.  Musculoskeletal: Negative.  Negative for back pain.  Skin:       Erythema is improving Still has some drainage at times  Neurological: Negative for dizziness, tremors, speech change, focal weakness, seizures and headaches.  Endo/Heme/Allergies: Negative.  Does not bruise/bleed easily.  Psychiatric/Behavioral: Negative.  Negative for depression, hallucinations and suicidal ideas.    Tolerating Diet: yes      DRUG ALLERGIES:   Allergies  Allergen Reactions  . Ace Inhibitors Hives and Swelling  . Ibuprofen Hives and Swelling    VITALS:  Blood pressure (!) 118/58, pulse 72, temperature 98.1 F (36.7 C), temperature source Oral, resp. rate 18, height 5\' 4"  (1.626 m), weight (!) 176.9 kg (390 lb), SpO2 97 %.  PHYSICAL EXAMINATION:   Physical Exam  Constitutional: She is oriented to person, place, and time and well-developed, well-nourished, and in no distress. No distress.  HENT:  Head: Normocephalic.  Eyes: No scleral icterus.  Neck: Normal range of motion. Neck supple. No JVD present. No tracheal deviation present.  Cardiovascular: Normal rate, regular rhythm and normal heart sounds.   Exam reveals no gallop and no friction rub.   No murmur heard. Pulmonary/Chest: Effort normal and breath sounds normal. No respiratory distress. She has no wheezes. She has no rales. She exhibits no tenderness.  Abdominal: Soft. Bowel sounds are normal. She exhibits no distension and no mass. There is no tenderness. There is no rebound and no guarding.  Musculoskeletal: Normal range of motion. She exhibits no edema.  Neurological: She is alert and oriented to person, place, and time.  Skin: Skin is warm. No rash noted. No erythema.  Lower extremities with elephantiasis   Erythema on right thigh has decreased. Once and tenderness has significantly decreased.  Psychiatric: Affect and judgment normal.      LABORATORY PANEL:   CBC  Recent Labs Lab 08/05/16 0305  WBC 7.0  HGB 10.9*  HCT 31.7*  PLT 168   ------------------------------------------------------------------------------------------------------------------  Chemistries   Recent Labs Lab 08/05/16 0305  NA 138  K 4.0  CL 105  CO2 27  GLUCOSE 107*  BUN 17  CREATININE 0.81  CALCIUM 7.9*   ------------------------------------------------------------------------------------------------------------------  Cardiac Enzymes No results for input(s): TROPONINI in the last 168 hours. ------------------------------------------------------------------------------------------------------------------  RADIOLOGY:  US Venous Img Lower Unilateral Right  Result Date: 08/04/2016 CLINICAL DATA:  61 year old female with right lower extremity swelling and redness. EXAM: Right LOWER EXTREMITY VENOUS DOPPLER ULTRASOUND TECHNIQUE: Gray-scale sonography with graded compression, as well as color Doppler and duplex ultrasound were performed to evaluate the lower extremity deep venous systems from the level of the common femoral vein and including the common femoral, femoral, profunda femoral, popliteal and calf veins including the posterior  tibial, peroneal and gastrocnemius  veins when visible. The superficial great saphenous vein was also interrogated. Spectral Doppler was utilized to evaluate flow at rest and with distal augmentation maneuvers in the common femoral, femoral and popliteal veins. COMPARISON:  Ultrasound dated 09/28/2013 FINDINGS: Contralateral Common Femoral Vein: Respiratory phasicity is normal and symmetric with the symptomatic side. No evidence of thrombus. Normal compressibility. Common Femoral Vein: No evidence of thrombus. Normal compressibility, respiratory phasicity and response to augmentation. Saphenofemoral Junction: No evidence of thrombus. Normal compressibility and flow on color Doppler imaging. Profunda Femoral Vein: No evidence of thrombus. Normal compressibility and flow on color Doppler imaging. Femoral Vein: The visualized proximal portion of the femoral vein appears patent. The distal portion of the femoral vein is not visualized due to patient's body habitus and lymphedema. Popliteal Vein: Not well visualized. Color flow noted within the popliteal vein. Calf Veins: Not seen Superficial Great Saphenous Vein: No evidence of thrombus. Normal compressibility and flow on color Doppler imaging. Venous Reflux:  None. Other Findings:  None. IMPRESSION: Limited evaluation due to patient's body habitus and lymphedema. The distal femoral vein, popliteal vein, and calf veins are not visualized. No DVT noted in the visualized proximal portion of the femoral vein or in the common femoral vein. Electronically Signed   By: Anner Crete M.D.   On: 08/04/2016 00:12     ASSESSMENT AND PLAN:   61 year old female with lower extremity elephantiasis/chronic lymphedema presents with cellulitis of right lower extremity.  1. Right lower extremity cellulitis: Continue vancomycin as this seems to be working Patient unable to elevate right leg due to hip pain Needs 1-2 days IV ABX  2. Chronic lymphedema/elephantitis: Wound  care consult 3. OSA on CPAP  4. Essential hypertension: Continue Norvasc and atenolol  5. Mild intermittent asthma: Continue inhaler when necessary  7. Morbid obesity: Encouraged weight loss as tolerated.     Management plans discussed with the patient and she is in agreement.  CODE STATUS: FULL  TOTAL TIME TAKING CARE OF THIS PATIENT: 21 minutes.     POSSIBLE D/C 2 days, DEPENDING ON CLINICAL CONDITION.   Aybree Lanyon M.D on 08/05/2016 at 10:10 AM  Between 7am to 6pm - Pager - [phone] After 6pm go to [email or web address] - password [email or web address] [facility]  Office  [phone]  CC: Primary care physician; Leotis Shames, MD  Note: This dictation was prepared with Dragon dictation along with smaller phrase technology. Any transcriptional errors that result from this process are unintentional.

## 2016-08-06 LAB — BASIC METABOLIC PANEL
ANION GAP: 5 (ref 5–15)
BUN: 15 mg/dL (ref 6–20)
CO2: 28 mmol/L (ref 22–32)
Calcium: 8 mg/dL — ABNORMAL LOW (ref 8.9–10.3)
Chloride: 105 mmol/L (ref 101–111)
Creatinine, Ser: 0.75 mg/dL (ref 0.44–1.00)
Glucose, Bld: 101 mg/dL — ABNORMAL HIGH (ref 65–99)
POTASSIUM: 4 mmol/L (ref 3.5–5.1)
Sodium: 138 mmol/L (ref 135–145)

## 2016-08-06 LAB — VANCOMYCIN, TROUGH: Vancomycin Tr: 17 ug/mL (ref 15–20)

## 2016-08-06 MED ORDER — SENNOSIDES-DOCUSATE SODIUM 8.6-50 MG PO TABS
1.0000 | ORAL_TABLET | Freq: Two times a day (BID) | ORAL | Status: DC
  Administered 2016-08-07: 1 via ORAL
  Filled 2016-08-06: qty 1

## 2016-08-06 NOTE — Progress Notes (Signed)
Pharmacy Antibiotic Note  [name] is a 61 y.o. female admitted on 08/03/2016 with cellulitis.  Pharmacy has been consulted for vancomycin dosing.  Plan: DW 104kg  Vd 73L kei 0.087 hr-1  T1/2 8 hours Vancomycin 1500 mg q 12 hours ordered with stacked dosing. Level before 5th dose. Goal trough 15-20.  1/29 VT 17 which in goal. Will continue current regimen and recheck next VT on 1/30 @ 0930 prior to 3rd dose to ensure therapeutic level. Renal function remains stable will continue to monitor.    Height: 5\' 4"  (162.6 cm) Weight: (!) 390 lb (176.9 kg) IBW/kg (Calculated) : 54.7  Temp (24hrs), Avg:98.2 F (36.8 C), Min:98.2 F (36.8 C), Max:98.2 F (36.8 C)   Last Labs    Recent Labs Lab 08/03/16 1811  WBC 9.0  CREATININE 0.99      Estimated Creatinine Clearance: 98.8 mL/min (by C-G formula based on SCr of 0.99 mg/dL).        Allergies  Allergen Reactions  . Ace Inhibitors Hives and Swelling  . Ibuprofen Hives and Swelling    Antimicrobials this admission: Zosyn x1  Vancomycin 1/27 >>   Dose adjustments this admission: 0128 PM dose given before level drawn. Will reschedule for 0129 AM.   Microbiology results: 1/27 BCx: pending 1/27 Wound Cx: GPC in pairs   Thank you for allowing pharmacy to be a part of this patient's care.  Tobie Lords, PharmD, BCPS Clinical Pharmacist 08/06/2016

## 2016-08-06 NOTE — Progress Notes (Signed)
[facility] at [place] NAME: [name]    MR#:  [id number]  DATE OF BIRTH:  [birth year]  SUBJECTIVE:  Erythema slowly improving. Less pain. afebrile  REVIEW OF SYSTEMS:    Review of Systems  Constitutional: Negative.  Negative for chills, fever and malaise/fatigue.  HENT: Negative.  Negative for ear discharge, ear pain, hearing loss, nosebleeds and sore throat.   Eyes: Negative.  Negative for blurred vision and pain.  Respiratory: Negative.  Negative for cough, hemoptysis, shortness of breath and wheezing.   Cardiovascular: Positive for leg swelling. Negative for chest pain and palpitations.  Gastrointestinal: Negative.  Negative for abdominal pain, blood in stool, diarrhea, nausea and vomiting.  Genitourinary: Negative.  Negative for dysuria.  Musculoskeletal: Negative.  Negative for back pain.  Skin:       Erythema is improving Still has some drainage at times  Neurological: Negative for dizziness, tremors, speech change, focal weakness, seizures and headaches.  Endo/Heme/Allergies: Negative.  Does not bruise/bleed easily.  Psychiatric/Behavioral: Negative.  Negative for depression, hallucinations and suicidal ideas.   DRUG ALLERGIES:   Allergies  Allergen Reactions  . Ace Inhibitors Hives and Swelling  . Ibuprofen Hives and Swelling    VITALS:  Blood pressure (!) 115/43, pulse 71, temperature 98.2 F (36.8 C), temperature source Oral, resp. rate 18, height 5\' 4"  (1.626 m), weight (!) 176.9 kg (390 lb), SpO2 96 %.  PHYSICAL EXAMINATION:   Physical Exam  Constitutional: She is oriented to person, place, and time and well-developed, well-nourished, and in no distress. No distress.  HENT:  Head: Normocephalic.  Eyes: No scleral icterus.  Neck: Normal range of motion. Neck supple. No JVD present. No tracheal deviation present.  Cardiovascular: Normal rate, regular rhythm and normal heart sounds.  Exam reveals no gallop and no  friction rub.   No murmur heard. Pulmonary/Chest: Effort normal and breath sounds normal. No respiratory distress. She has no wheezes. She has no rales. She exhibits no tenderness.  Abdominal: Soft. Bowel sounds are normal. She exhibits no distension and no mass. There is no tenderness. There is no rebound and no guarding.  Musculoskeletal: Normal range of motion. She exhibits no edema.  Neurological: She is alert and oriented to person, place, and time.  Skin: Skin is warm. No rash noted. No erythema.  Lower extremities with elephantiasis   Erythema on right thigh has decreased. Once and tenderness has significantly decreased.  Psychiatric: Affect and judgment normal.      LABORATORY PANEL:   CBC  Recent Labs Lab 08/05/16 0305  WBC 7.0  HGB 10.9*  HCT 31.7*  PLT 168   ------------------------------------------------------------------------------------------------------------------  Chemistries   Recent Labs Lab 08/06/16 0312  NA 138  K 4.0  CL 105  CO2 28  GLUCOSE 101*  BUN 15  CREATININE 0.75  CALCIUM 8.0*   ------------------------------------------------------------------------------------------------------------------  Cardiac Enzymes No results for input(s): TROPONINI in the last 168 hours. ------------------------------------------------------------------------------------------------------------------  RADIOLOGY:  No results found.   ASSESSMENT AND PLAN:   61 year old female with lower extremity elephantiasis/chronic lymphedema presents with cellulitis of right lower extremity.  1. Right lower extremity cellulitis: On vancomycin  Patient unable to elevate right leg due to hip pain We'll switch to oral antibiotics tomorrow.  2. Chronic lymphedem  Wound care consult  3. OSA on CPAP  4. Essential hypertension: Continue Norvasc and atenolol  5. Mild intermittent asthma: Continue inhaler when necessary  7. Morbid obesity: Encouraged weight  loss as tolerated.  Management plans discussed with the patient and she is in agreement.  CODE STATUS: FULL  TOTAL TIME TAKING CARE OF THIS PATIENT: 30 minutes.   POSSIBLE D/C 1-2 days, DEPENDING ON CLINICAL CONDITION.  Hillary Bow R M.D on 08/06/2016 at 2:45 PM  Between 7am to 6pm - Pager - [phone]  After 6pm go to [email or web address] - password [email or web address] [facility]  Office  [phone]  CC: Primary care physician; Leotis Shames, MD  Note: This dictation was prepared with Dragon dictation along with smaller phrase technology. Any transcriptional errors that result from this process are unintentional.

## 2016-08-07 LAB — AEROBIC/ANAEROBIC CULTURE W GRAM STAIN (SURGICAL/DEEP WOUND): Special Requests: NORMAL

## 2016-08-07 LAB — VANCOMYCIN, TROUGH: Vancomycin Tr: 27 ug/mL (ref 15–20)

## 2016-08-07 MED ORDER — SULFAMETHOXAZOLE-TRIMETHOPRIM 800-160 MG PO TABS: 1.0000 | ORAL_TABLET | Freq: Two times a day (BID) | ORAL | 0 refills | Status: DC

## 2016-08-07 NOTE — Discharge Summary (Signed)
[name] at [place] NAME: [name]    MR#:  [id number]  DATE OF BIRTH:  [birth year]  DATE OF ADMISSION:  08/03/2016 ADMITTING PHYSICIAN: Lance Coon, MD  DATE OF DISCHARGE: 08/07/2016  2:26 PM  PRIMARY CARE PHYSICIAN: Leotis Shames, MD   ADMISSION DIAGNOSIS:  Cellulitis of left lower extremity [L03.116]  DISCHARGE DIAGNOSIS:  Principal Problem:   Cellulitis of right leg Active Problems:   Lymphedema   OSA (obstructive sleep apnea)   HTN (hypertension)   Asthma   SECONDARY DIAGNOSIS:   Past Medical History:  Diagnosis Date  . Arthritis   . Asthma   . Enlarged heart   . Hypertension   . Lymphedema   . Sleep apnea      ADMITTING HISTORY  HISTORY OF PRESENT ILLNESS:  [name]  is a 61 y.o. female who presents with Right lower shunt he cellulitis. Patient has lymphedema in that leg and has had cellulitis like this before. The past 4-5 days this is been going on, she went to her primary care physician to try and get treatment, but does not have a very clear story as to what happened there. Here in the ED tonight she clearly has cellulitis with some blistering and weeping. Hospitalists were called for admission   HOSPITAL COURSE:   61 year old female with lower extremity elephantiasis/chronic lymphedema presents with cellulitis of right lower extremity.  1. Right lower extremity cellulitis: On vancomycin through IV in the hospital. Patient did well in the past with oral Bactrim which she will be given a prescription for 1 more week. Home health nursing has been set up for dressing changes.  2. Chronic lymphedema Has lower extremity problems that she uses at home.  3. OSA on CPAP  4. Essential hypertension: Continue Norvasc and atenolol  5. Mild intermittent asthma: Continue inhaler when necessary  7. Morbid obesity: Encouraged weight loss as tolerated.  Stable for discharge home to follow-up with  primary care physician.  CONSULTS OBTAINED:    DRUG ALLERGIES:   Allergies  Allergen Reactions  . Ace Inhibitors Hives and Swelling  . Ibuprofen Hives and Swelling    DISCHARGE MEDICATIONS:   Discharge Medication List as of 08/07/2016  1:19 PM    START taking these medications   Details  oxyCODONE-acetaminophen (PERCOCET) 5-325 MG tablet Take 2 tablets by mouth every 6 (six) hours as needed for moderate pain or severe pain., Starting Fri 08/03/2016, Print    sulfamethoxazole-trimethoprim (BACTRIM DS,SEPTRA DS) 800-160 MG tablet Take 1 tablet by mouth 2 (two) times daily., Starting Tue 08/07/2016, Normal      CONTINUE these medications which have NOT CHANGED   Details  amLODipine (NORVASC) 2.5 MG tablet Take 2.5 mg by mouth daily., Until Discontinued, Historical Med    atenolol (TENORMIN) 50 MG tablet Take 50 mg by mouth daily., Until Discontinued, Historical Med    furosemide (LASIX) 20 MG tablet Take 20 mg by mouth daily as needed for fluid., Historical Med    acetaminophen (TYLENOL) 500 MG tablet Take 500 mg by mouth every 6 (six) hours as needed., Until Discontinued, Historical Med    albuterol (PROVENTIL HFA;VENTOLIN HFA) 108 (90 BASE) MCG/ACT inhaler Inhale 2 puffs into the lungs every 6 (six) hours as needed for wheezing or shortness of breath., Until Discontinued, Historical Med        Today   VITAL SIGNS:  Blood pressure (!) 106/50, pulse 73, temperature 97.9 F (36.6 C), temperature source  Oral, resp. rate 18, height 5\' 4"  (1.626 m), weight (!) 176.9 kg (390 lb), SpO2 97 %.  I/O:   Intake/Output Summary (Last 24 hours) at 08/07/16 1643 Last data filed at 08/07/16 0900  Gross per 24 hour  Intake              480 ml  Output                0 ml  Net              480 ml    PHYSICAL EXAMINATION:  Physical Exam  GENERAL:  61 y.o.-year-old patient lying in the bed with no acute distress. Morbidly obese LUNGS: Normal breath sounds bilaterally, no wheezing,  rales,rhonchi or crepitation. No use of accessory muscles of respiration.  CARDIOVASCULAR: S1, S2 normal. No murmurs, rubs, or gallops.  ABDOMEN: Soft, non-tender, non-distended. Bowel sounds present. No organomegaly or mass.  NEUROLOGIC: Moves all 4 extremities. PSYCHIATRIC: The patient is alert and oriented x 3.  Bilateral lower extremity lymphedema with severe skin thickening. Erythema in right popliteal fossa but skin folds. Serous discharge. No pus. No bleeding.  DATA REVIEW:   CBC  Recent Labs Lab 08/05/16 0305  WBC 7.0  HGB 10.9*  HCT 31.7*  PLT 168    Chemistries   Recent Labs Lab 08/06/16 0312  NA 138  K 4.0  CL 105  CO2 28  GLUCOSE 101*  BUN 15  CREATININE 0.75  CALCIUM 8.0*    Cardiac Enzymes No results for input(s): TROPONINI in the last 168 hours.  Microbiology Results  Results for orders placed or performed during the hospital encounter of 08/03/16  Aerobic/Anaerobic Culture (surgical/deep wound)     Status: None   Collection Time: 08/03/16  8:49 PM  Result Value Ref Range Status   Specimen Description ABSCESS LEG RIGHT  Final   Special Requests Normal  Final   Gram Stain   Final    FEW SQUAMOUS EPITHELIAL CELLS PRESENT NO WBC SEEN FEW GRAM POSITIVE COCCI IN PAIRS    Culture MULTIPLE ORGANISMS PRESENT, NONE PREDOMINANT  Final   Report Status 08/07/2016 FINAL  Final  Culture, blood (routine x 2)     Status: None (Preliminary result)   Collection Time: 08/04/16  3:17 AM  Result Value Ref Range Status   Specimen Description BLOOD LEFT HAND  Final   Special Requests   Final    BOTTLES DRAWN AEROBIC AND ANAEROBIC AERO6ML,ANAERO6ML   Culture NO GROWTH 3 DAYS  Final   Report Status PENDING  Incomplete  Culture, blood (routine x 2)     Status: None (Preliminary result)   Collection Time: 08/04/16  3:27 AM  Result Value Ref Range Status   Specimen Description BLOOD RIGHT HAND  Final   Special Requests   Final    BOTTLES DRAWN AEROBIC AND  ANAEROBIC AERO3ML,ANAERO4ML   Culture NO GROWTH 3 DAYS  Final   Report Status PENDING  Incomplete    RADIOLOGY:  No results found.  Follow up with PCP in 1 week.  Management plans discussed with the patient, family and they are in agreement.  CODE STATUS:     Code Status Orders        Start     Ordered   08/04/16 0247  Full code  Continuous     08/04/16 0246    Code Status History    Date Active Date Inactive Code Status Order ID Comments User Context   01/23/2015  9:14 PM 01/28/2015  5:17 PM Full Code [id number]  Lytle Butte, MD ED      TOTAL TIME TAKING CARE OF THIS PATIENT ON DAY OF DISCHARGE: more than 30 minutes.   Hillary Bow R M.D on 08/07/2016 at 4:43 PM  Between 7am to 6pm - Pager - [phone]  After 6pm go to [email or web address] - password [email or web address] [facility]  Office  [phone]  CC: Primary care physician; Leotis Shames, MD  Note: This dictation was prepared with Dragon dictation along with smaller phrase technology. Any transcriptional errors that result from this process are unintentional.

## 2016-08-07 NOTE — Progress Notes (Signed)
Patient was discharged home with family. No IV site. Reviewed instructions including follow-up, diet, and activity. Dressing to bilat legs changed before discharge. Allowed time for questions.

## 2016-08-08 NOTE — Care Management Note (Signed)
Case Management Note  Patient Details  Name: [name] MRN: [id number] Date of Birth: [birth year]  Subjective/Objective:   Patient agreeable to [facility].  For home health nursing and dressing changes.                 Action/Plan:   Expected Discharge Date:  08/07/16               Expected Discharge Plan:  [place]  In-House Referral:     Discharge planning Services  CM Consult  Post Acute Care Choice:  Home Health Choice offered to:  Patient  DME Arranged:    DME Agency:     HH Arranged:  RN [place] Agency:  [facility]  Status of Service:  Completed, signed off  If discussed at [place] of Stay Meetings, dates discussed:    Additional Comments:  Jolly Mango, RN 08/08/2016, 3:31 PM

## 2016-08-09 LAB — CULTURE, BLOOD (ROUTINE X 2)
CULTURE: NO GROWTH
Culture: NO GROWTH

## 2017-02-04 DIAGNOSIS — R002 Palpitations: Secondary | ICD-10-CM | POA: Insufficient documentation

## 2017-02-04 DIAGNOSIS — R0602 Shortness of breath: Secondary | ICD-10-CM | POA: Insufficient documentation

## 2017-02-04 DIAGNOSIS — R079 Chest pain, unspecified: Secondary | ICD-10-CM | POA: Insufficient documentation

## 2017-06-14 ENCOUNTER — Other Ambulatory Visit: Payer: Self-pay | Admitting: Family Medicine

## 2017-06-14 DIAGNOSIS — Z1239 Encounter for other screening for malignant neoplasm of breast: Secondary | ICD-10-CM

## 2017-10-15 ENCOUNTER — Ambulatory Visit
Admission: RE | Admit: 2017-10-15 | Discharge: 2017-10-15 | Disposition: A | Discharge status: Home / Self Care | Payer: Medicaid Other | Source: Ambulatory Visit | Attending: Family Medicine | Admitting: Family Medicine

## 2017-10-15 DIAGNOSIS — Z1231 Encounter for screening mammogram for malignant neoplasm of breast: Secondary | ICD-10-CM | POA: Insufficient documentation

## 2017-10-15 DIAGNOSIS — Z1239 Encounter for other screening for malignant neoplasm of breast: Secondary | ICD-10-CM

## 2017-12-23 ENCOUNTER — Emergency Department
Admission: EM | Admit: 2017-12-23 | Discharge: 2017-12-23 | Disposition: A | Discharge status: Home / Self Care | Payer: Medicaid Other | Attending: Emergency Medicine | Admitting: Emergency Medicine

## 2017-12-23 ENCOUNTER — Other Ambulatory Visit: Payer: Self-pay

## 2017-12-23 DIAGNOSIS — R2243 Localized swelling, mass and lump, lower limb, bilateral: Secondary | ICD-10-CM | POA: Diagnosis present

## 2017-12-23 DIAGNOSIS — Z79899 Other long term (current) drug therapy: Secondary | ICD-10-CM | POA: Insufficient documentation

## 2017-12-23 DIAGNOSIS — J45909 Unspecified asthma, uncomplicated: Secondary | ICD-10-CM | POA: Insufficient documentation

## 2017-12-23 DIAGNOSIS — L0390 Cellulitis, unspecified: Secondary | ICD-10-CM

## 2017-12-23 DIAGNOSIS — I10 Essential (primary) hypertension: Secondary | ICD-10-CM | POA: Diagnosis not present

## 2017-12-23 DIAGNOSIS — L03115 Cellulitis of right lower limb: Secondary | ICD-10-CM | POA: Diagnosis not present

## 2017-12-23 DIAGNOSIS — Z87891 Personal history of nicotine dependence: Secondary | ICD-10-CM | POA: Insufficient documentation

## 2017-12-23 DIAGNOSIS — L03116 Cellulitis of left lower limb: Secondary | ICD-10-CM | POA: Insufficient documentation

## 2017-12-23 LAB — CBC WITH DIFFERENTIAL/PLATELET
BASOS PCT: 1 %
Basophils Absolute: 0 10*3/uL (ref 0–0.1)
EOS ABS: 0 10*3/uL (ref 0–0.7)
EOS PCT: 0 %
HEMATOCRIT: 39.4 % (ref 35.0–47.0)
Hemoglobin: 12.8 g/dL (ref 12.0–16.0)
Lymphocytes Relative: 25 %
Lymphs Abs: 1.4 10*3/uL (ref 1.0–3.6)
MCH: 26.2 pg (ref 26.0–34.0)
MCHC: 32.4 g/dL (ref 32.0–36.0)
MCV: 80.9 fL (ref 80.0–100.0)
MONO ABS: 0.8 10*3/uL (ref 0.2–0.9)
Monocytes Relative: 14 %
NEUTROS ABS: 3.4 10*3/uL (ref 1.4–6.5)
Neutrophils Relative %: 60 %
PLATELETS: 170 10*3/uL (ref 150–440)
RBC: 4.87 MIL/uL (ref 3.80–5.20)
RDW: 15.3 % — AB (ref 11.5–14.5)
WBC: 5.7 10*3/uL (ref 3.6–11.0)

## 2017-12-23 LAB — COMPREHENSIVE METABOLIC PANEL
ALBUMIN: 3.5 g/dL (ref 3.5–5.0)
ALK PHOS: 133 U/L — AB (ref 38–126)
ALT: 12 U/L — AB (ref 14–54)
ANION GAP: 9 (ref 5–15)
AST: 20 U/L (ref 15–41)
BILIRUBIN TOTAL: 0.6 mg/dL (ref 0.3–1.2)
BUN: 16 mg/dL (ref 6–20)
CALCIUM: 8.7 mg/dL — AB (ref 8.9–10.3)
CO2: 25 mmol/L (ref 22–32)
CREATININE: 0.93 mg/dL (ref 0.44–1.00)
Chloride: 103 mmol/L (ref 101–111)
GFR calc Af Amer: >60 mL/min (ref >60)
GFR calc non Af Amer: >60 mL/min (ref >60)
GLUCOSE: 104 mg/dL — AB (ref 65–99)
Potassium: 3.8 mmol/L (ref 3.5–5.1)
SODIUM: 137 mmol/L (ref 135–145)
TOTAL PROTEIN: 7.9 g/dL (ref 6.5–8.1)

## 2017-12-23 MED ORDER — CLINDAMYCIN PHOSPHATE 600 MG/50ML IV SOLN
600.0000 mg | Freq: Once | INTRAVENOUS | Status: AC
  Administered 2017-12-23: 600 mg via INTRAVENOUS
  Filled 2017-12-23 (×2): qty 50

## 2017-12-23 MED ORDER — CLINDAMYCIN HCL 300 MG PO CAPS: 300.0000 mg | ORAL_CAPSULE | Freq: Three times a day (TID) | ORAL | 0 refills | Status: AC

## 2017-12-23 NOTE — Discharge Instructions (Signed)
Take the antibiotic as prescribed and finish the full course.  Follow-up with your regular doctor in 1 week.  Return to the ER immediately for new, worsening, or persistent severe redness, pain going up the leg, new, worsening, persistent drainage, fevers, weakness, or any other new or worsening symptoms that concern you.

## 2017-12-23 NOTE — ED Triage Notes (Signed)
Pt has chronic lyphodema in BL legs. States over the past week she has drainage from the left leg with redness any irritation.

## 2017-12-23 NOTE — Care Management (Signed)
RNCM called to ED to arrange home health for wound care to patient's left lower extremity. Patient was receiving dressing changes up until March with [facility] but does not want that agency again.  [facility] can go to her address. Home health list provided to patient for review.  Referral to [facility] with [facility]. HHRN orders obtained from MD/ED.

## 2017-12-23 NOTE — ED Provider Notes (Signed)
[facility] Emergency Department Provider Note ____________________________________________   First MD Initiated Contact with Patient 12/23/17 1149     (approximate)  I have reviewed the triage vital signs and the nursing notes.   HISTORY  Chief Complaint Wound Infection    HPI [name] is a 62 y.o. female with PMH as noted below including history of lymphedema and previous cellulitis who presents with redness and drainage to her lower legs, gradual onset over the last several days, worsening course, and similar to prior episodes of cellulitis.  Patient reports some mild chills but no fever.  She denies any pain going up the leg.  She has not sought care previously for this episode.  Past Medical History:  Diagnosis Date  . Arthritis   . Asthma   . Enlarged heart   . Hypertension   . Lymphedema   . Sleep apnea     Patient Active Problem List   Diagnosis Date Noted  . Lymphedema 08/04/2016  . OSA (obstructive sleep apnea) 08/04/2016  . HTN (hypertension) 08/04/2016  . Asthma 08/04/2016  . Cellulitis 01/24/2015  . Cellulitis of right leg 01/23/2015    Past Surgical History:  Procedure Laterality Date  . NO PAST SURGERIES      Prior to Admission medications   Medication Sig Start Date End Date Taking? Authorizing Provider  acetaminophen (TYLENOL) 500 MG tablet Take 500 mg by mouth every 6 (six) hours as needed.    [provider]  albuterol (PROVENTIL HFA;VENTOLIN HFA) 108 (90 BASE) MCG/ACT inhaler Inhale 2 puffs into the lungs every 6 (six) hours as needed for wheezing or shortness of breath.    [provider]  amLODipine (NORVASC) 2.5 MG tablet Take 2.5 mg by mouth daily.    [provider]  atenolol (TENORMIN) 50 MG tablet Take 50 mg by mouth daily.    [provider]  clindamycin (CLEOCIN) 300 MG capsule Take 1 capsule (300 mg total) by mouth 3 (three) times daily for 10 days. 12/23/17 01/02/18   Arta Silence, MD  furosemide (LASIX) 20 MG tablet Take 20 mg by mouth daily as needed for fluid.    [provider]  oxyCODONE-acetaminophen (PERCOCET) 5-325 MG tablet Take 2 tablets by mouth every 6 (six) hours as needed for moderate pain or severe pain. 08/03/16   Earleen Newport, MD  sulfamethoxazole-trimethoprim (BACTRIM DS,SEPTRA DS) 800-160 MG tablet Take 1 tablet by mouth 2 (two) times daily. 08/07/16   Hillary Bow, MD    Allergies Ace inhibitors and Ibuprofen  Family History  Problem Relation Age of Onset  . Thyroid disease Other   . Breast cancer Maternal Aunt     Social History Social History   Tobacco Use  . Smoking status: Former Research scientist (life sciences)  . Smokeless tobacco: Current User    Types: Snuff  Substance Use Topics  . Alcohol use: No  . Drug use: No    Review of Systems  Constitutional: No fever. Eyes: No redness. ENT: No sore throat. Cardiovascular: Denies chest pain. Respiratory: Denies shortness of breath. Gastrointestinal: No vomiting.  Genitourinary: Negative for dysuria.  Musculoskeletal: Negative for back pain. Skin: Positive for rash. Neurological: Negative for headache.   ____________________________________________   PHYSICAL EXAM:  VITAL SIGNS: ED Triage Vitals  Enc Vitals Group     BP 12/23/17 1112 126/70     Pulse Rate 12/23/17 1112 67     Resp 12/23/17 1112 17     Temp 12/23/17 1112 98.4  F (36.9 C)     Temp Source 12/23/17 1112 Oral     SpO2 12/23/17 1112 96 %     Weight 12/23/17 1114 (!) 390 lb (176.9 kg)     Height 12/23/17 1114 5\' 3"  (1.6 m)     Head Circumference --      Peak Flow --      Pain Score 12/23/17 1114 0     Pain Loc --      Pain Edu? --      Excl. in [place]? --     Constitutional: Alert and oriented.  Actively well appearing and in no acute distress. Eyes: Conjunctivae are normal.  EOMI. Head: Atraumatic. Nose: No congestion/rhinnorhea. Mouth/Throat: Mucous membranes are moist.   Neck: Normal  range of motion.  Cardiovascular: Normal rate, regular rhythm.  Good peripheral circulation. Respiratory: Normal respiratory effort.  Gastrointestinal: No distention.  Musculoskeletal: Extensive bilateral chronic appearing lymphedema.  Left lower extremity with increased erythema to the anterior lower leg with approximately 10 cm area of more distinct induration and purulent appearing drainage.  Small area to the posterior right lower leg with clear drainage. Neurologic:  Normal speech and language. No gross focal neurologic deficits are appreciated.  Skin:  Skin is warm and dry. No rash noted. Psychiatric: Mood and affect are normal. Speech and behavior are normal.  ____________________________________________   LABS (all labs ordered are listed, but only abnormal results are displayed)  Labs Reviewed  COMPREHENSIVE METABOLIC PANEL - Abnormal; Notable for the following components:      Result Value   Glucose, Bld 104 (*)    Calcium 8.7 (*)    ALT 12 (*)    Alkaline Phosphatase 133 (*)    All other components within normal limits  CBC WITH DIFFERENTIAL/PLATELET - Abnormal; Notable for the following components:   RDW 15.3 (*)    All other components within normal limits   ____________________________________________  EKG   ____________________________________________  RADIOLOGY    ____________________________________________   PROCEDURES  Procedure(s) performed: No  Procedures  Critical Care performed: No ____________________________________________   INITIAL IMPRESSION / ASSESSMENT AND PLAN / ED COURSE  Pertinent labs & imaging results that were available during my care of the patient were reviewed by me and considered in my medical decision making (see chart for details).  62 year old female with PMH as noted above presents with worsening rash and drainage to an area of lymphedema on bilateral lower legs but primarily on the left.  The patient has no  significant systemic symptoms.  I reviewed the past medical records in epic; the patient was admitted in early 2018 for cellulitis because of concern for the wide area of the cellulitis at that time, as well as her lack of home care.  On exam today, the vital signs are normal, the patient is comfortable appearing.  The remainder of the exam is as described above.  Overall presentation is consistent with cellulitis.  There is no fluctuance or area suggestive of abscess, the lower extremities are well perfused, and there is no evidence of deep soft tissue infection.  Given her lack of systemic symptoms or fever, I think it would be appropriate to attempt a course of outpatient oral antibiotics.  Will obtain labs and reassess.  If no concerning lab abnormalities, consider discharge with outpatient antibiotics.  ----------------------------------------- 1:52 PM on 12/23/2017 -----------------------------------------  Lab work-up is unremarkable.  The patient agrees with the plan to go home, and states she would prefer this.  I  contacted the case manager, who evaluated the patient, and arrange for home wound care and dressing changes.  A dose of clindamycin was given via IV in the ED, and I will prescribe the same.  Return precautions given, and the patient expresses understanding.  ____________________________________________   FINAL CLINICAL IMPRESSION(S) / ED DIAGNOSES  Final diagnoses:  Cellulitis, unspecified cellulitis site      NEW MEDICATIONS STARTED DURING THIS VISIT:  New Prescriptions   CLINDAMYCIN (CLEOCIN) 300 MG CAPSULE    Take 1 capsule (300 mg total) by mouth 3 (three) times daily for 10 days.     Note:  This document was prepared using Dragon voice recognition software and may include unintentional dictation errors.    Arta Silence, MD 12/23/17 1353

## 2017-12-23 NOTE — ED Notes (Signed)
Pt presents to ED for possible wound to lower left leg. Hx of chronic severe lymphedema and noticed yellow drainage with foul smell over last couple of days. Pt denies fever/chills. 3/10 pain to lower leg. Area is red and weeping yellow fluid. Small skin tear to posterior of ankle. Pt also c/o weeping to skin fold on right leg but drainage is clear. Pt denies any other complaints.

## 2018-01-07 ENCOUNTER — Other Ambulatory Visit: Payer: Self-pay

## 2018-01-07 ENCOUNTER — Emergency Department
Admission: EM | Admit: 2018-01-07 | Discharge: 2018-01-07 | Disposition: A | Discharge status: Home / Self Care | Payer: Medicaid Other | Attending: Emergency Medicine | Admitting: Emergency Medicine

## 2018-01-07 ENCOUNTER — Encounter: Payer: Self-pay | Admitting: Emergency Medicine

## 2018-01-07 DIAGNOSIS — Z79899 Other long term (current) drug therapy: Secondary | ICD-10-CM | POA: Diagnosis not present

## 2018-01-07 DIAGNOSIS — M79642 Pain in left hand: Secondary | ICD-10-CM | POA: Insufficient documentation

## 2018-01-07 DIAGNOSIS — Z87891 Personal history of nicotine dependence: Secondary | ICD-10-CM | POA: Diagnosis not present

## 2018-01-07 DIAGNOSIS — J45998 Other asthma: Secondary | ICD-10-CM | POA: Insufficient documentation

## 2018-01-07 DIAGNOSIS — M542 Cervicalgia: Secondary | ICD-10-CM | POA: Insufficient documentation

## 2018-01-07 DIAGNOSIS — M79641 Pain in right hand: Secondary | ICD-10-CM | POA: Diagnosis not present

## 2018-01-07 DIAGNOSIS — I10 Essential (primary) hypertension: Secondary | ICD-10-CM | POA: Diagnosis not present

## 2018-01-07 DIAGNOSIS — M5412 Radiculopathy, cervical region: Secondary | ICD-10-CM | POA: Diagnosis not present

## 2018-01-07 LAB — TROPONIN I: Troponin I: <0.03 ng/mL (ref <0.03)

## 2018-01-07 LAB — BASIC METABOLIC PANEL
ANION GAP: 8 (ref 5–15)
BUN: 15 mg/dL (ref 8–23)
CHLORIDE: 104 mmol/L (ref 98–111)
CO2: 27 mmol/L (ref 22–32)
Calcium: 8.8 mg/dL — ABNORMAL LOW (ref 8.9–10.3)
Creatinine, Ser: 0.8 mg/dL (ref 0.44–1.00)
GFR calc Af Amer: >60 mL/min (ref >60)
GFR calc non Af Amer: >60 mL/min (ref >60)
GLUCOSE: 108 mg/dL — AB (ref 70–99)
Potassium: 3.9 mmol/L (ref 3.5–5.1)
Sodium: 139 mmol/L (ref 135–145)

## 2018-01-07 LAB — CBC
HEMATOCRIT: 37.1 % (ref 35.0–47.0)
HEMOGLOBIN: 12.1 g/dL (ref 12.0–16.0)
MCH: 26.2 pg (ref 26.0–34.0)
MCHC: 32.5 g/dL (ref 32.0–36.0)
MCV: 80.6 fL (ref 80.0–100.0)
Platelets: 172 10*3/uL (ref 150–440)
RBC: 4.61 MIL/uL (ref 3.80–5.20)
RDW: 15.6 % — ABNORMAL HIGH (ref 11.5–14.5)
WBC: 9.4 10*3/uL (ref 3.6–11.0)

## 2018-01-07 MED ORDER — LORAZEPAM 2 MG/ML IJ SOLN
0.5000 mg | Freq: Once | INTRAMUSCULAR | Status: AC
  Administered 2018-01-07: 0.5 mg via INTRAVENOUS
  Filled 2018-01-07: qty 1

## 2018-01-07 MED ORDER — DIAZEPAM 2 MG PO TABS: 2.0000 mg | ORAL_TABLET | Freq: Three times a day (TID) | ORAL | 0 refills | Status: DC | PRN

## 2018-01-07 NOTE — ED Notes (Signed)
Assumed care of pt who is resting on stretcher with even/unlabored respirations. Pt family remains at bedside. Awaiting disposition. Call bell within reach, will continue to monitor.

## 2018-01-07 NOTE — ED Notes (Signed)
[name]  CALLED  FOR  TRANSPORT  HOME 

## 2018-01-07 NOTE — ED Notes (Signed)
Awaiting [name] for transport home, pt/family updated.

## 2018-01-07 NOTE — ED Notes (Signed)
Patient with pain in bilateral arms, worse with palpation and movement. Redness and swelling noted to right hand and forearm. Splotchy redness noted to left hand. Patient states pain all the way from hands to shoulders and radiating up neck.

## 2018-01-07 NOTE — ED Notes (Signed)
[name] here to take pt home.

## 2018-01-07 NOTE — ED Notes (Signed)
Pt sleeping on stretcher with even, unlabored respirations. Pt does not appear in any pain at this time. VSS. MD Corky Downs made aware.

## 2018-01-07 NOTE — ED Triage Notes (Signed)
Patient to ED from home via [name]. Reports worsening pain and cramping to bilateral hands, arms, shoulders and neck. Patient states pain started Sunday. Also reports some numbness to bilateral hands. Patient also with edema to bilateral legs. Currently taking antibiotics for infection of left leg. Patient denies any injury or recent falls. States only new activity was mopping floor on Sunday morning.

## 2018-01-07 NOTE — ED Provider Notes (Signed)
[facility] Emergency Department Provider Note   ____________________________________________    I have reviewed the triage vital signs and the nursing notes.   HISTORY  Chief Complaint Hand Pain; Arm Pain; and Neck Pain     HPI [name] is a 62 y.o. female who complains of diffuse pain although she reports is primarily in her hands bilaterally.  This started 48 hours ago and is been intermittent.  She denies fevers or injury to the area.  She is never had this before.  Denies a history of electrolyte abnormalities.  Denies anxiety attacks.  No chest pain has been treated for cellulitis of the lower leg with clindamycin.  Past Medical History:  Diagnosis Date  . Arthritis   . Asthma   . Enlarged heart   . Hypertension   . Lymphedema   . Sleep apnea     Patient Active Problem List   Diagnosis Date Noted  . Lymphedema 08/04/2016  . OSA (obstructive sleep apnea) 08/04/2016  . HTN (hypertension) 08/04/2016  . Asthma 08/04/2016  . Cellulitis 01/24/2015  . Cellulitis of right leg 01/23/2015    Past Surgical History:  Procedure Laterality Date  . NO PAST SURGERIES      Prior to Admission medications   Medication Sig Start Date End Date Taking? Authorizing Provider  acetaminophen (TYLENOL) 500 MG tablet Take 1,000-1,500 mg by mouth every 6 (six) hours as needed for mild pain.    Yes [provider]  albuterol (PROVENTIL HFA;VENTOLIN HFA) 108 (90 BASE) MCG/ACT inhaler Inhale 2 puffs into the lungs every 6 (six) hours as needed for wheezing or shortness of breath.   Yes [provider]  amLODipine (NORVASC) 5 MG tablet Take 5 mg by mouth daily.    Yes [provider]  atenolol (TENORMIN) 50 MG tablet Take 50 mg by mouth daily.   Yes [provider]  cephALEXin (KEFLEX) 500 MG capsule Take 500 mg by mouth 4 (four) times daily. 01/01/18 01/08/18 Yes [provider]  cetirizine (ZYRTEC) 10 MG  tablet Take 10 mg by mouth daily as needed for allergies.   Yes [provider]  doxycycline (VIBRA-TABS) 100 MG tablet Take 100 mg by mouth 2 (two) times daily. 01/01/18 01/08/18 Yes [provider]  furosemide (LASIX) 20 MG tablet Take 20 mg by mouth daily as needed (extreme fluid retention).    Yes [provider]  diazepam (VALIUM) 2 MG tablet Take 1 tablet (2 mg total) by mouth every 8 (eight) hours as needed for anxiety. 01/07/18 01/07/19  Lavonia Drafts, MD  oxyCODONE-acetaminophen (PERCOCET) 5-325 MG tablet Take 2 tablets by mouth every 6 (six) hours as needed for moderate pain or severe pain. Patient not taking: Reported on 01/07/2018 08/03/16   Earleen Newport, MD     Allergies Ace inhibitors and Ibuprofen  Family History  Problem Relation Age of Onset  . Thyroid disease Other   . Breast cancer Maternal Aunt     Social History Social History   Tobacco Use  . Smoking status: Former Research scientist (life sciences)  . Smokeless tobacco: Current User    Types: Snuff  Substance Use Topics  . Alcohol use: No  . Drug use: No    Review of Systems  Constitutional: No fever/chills Eyes: No visual changes.  ENT: Chronic neck pain Cardiovascular: Denies chest pain. Respiratory: Denies shortness of breath. Gastrointestinal: No abdominal pain.   Genitourinary: Negative for dysuria. Musculoskeletal: As above Skin: Reports leg cellulitis has  improved Neurological: Negative for headaches    ____________________________________________   PHYSICAL EXAM:  VITAL SIGNS: ED Triage Vitals  Enc Vitals Group     BP 01/07/18 0954 (!) 160/88     Pulse Rate 01/07/18 0954 87     Resp 01/07/18 0954 20     Temp 01/07/18 0954 98.3 F (36.8 C)     Temp Source 01/07/18 0954 Oral     SpO2 01/07/18 0954 99 %     Weight 01/07/18 0956 (!) 176.9 kg (390 lb)     Height 01/07/18 0956 1.6 m (5\' 3" )     Head Circumference --      Peak Flow --      Pain Score 01/07/18 0955 10     Pain Loc --       Pain Edu? --      Excl. in [place]? --     Constitutional: Alert and oriented. No acute distress. Pleasant and interactive Eyes: Conjunctivae are normal.   Nose: No congestion/rhinnorhea. Mouth/Throat: Mucous membranes are moist.    Cardiovascular: Normal rate, regular rhythm.   Good peripheral circulation. Respiratory: Normal respiratory effort.  No retractions. Gastrointestinal: Soft and nontender. No distention.  No CVA tenderness.  Musculoskeletal: Severe lymphedema bilaterally, no evidence of cellulitis at this time.  No abnormality on hand exam, no erythema or swelling Neurologic:  Normal speech and language. No gross focal neurologic deficits are appreciated.  Skin:  Skin is warm, dry and intact. No rash noted. Psychiatric: Mood and affect are normal. Speech and behavior are normal.  ____________________________________________   LABS (all labs ordered are listed, but only abnormal results are displayed)  Labs Reviewed  BASIC METABOLIC PANEL - Abnormal; Notable for the following components:      Result Value   Glucose, Bld 108 (*)    Calcium 8.8 (*)    All other components within normal limits  CBC - Abnormal; Notable for the following components:   RDW 15.6 (*)    All other components within normal limits  TROPONIN I   ____________________________________________  EKG  ED ECG REPORT I, Lavonia Drafts, the attending physician, personally viewed and interpreted this ECG.  Date: 01/07/2018  Rhythm: normal sinus rhythm QRS Axis: normal Intervals: normal ST/T Wave abnormalities: normal Narrative Interpretation: no evidence of acute ischemia  ____________________________________________  RADIOLOGY  None ____________________________________________   PROCEDURES  Procedure(s) performed: No  Procedures   Critical Care performed: No ____________________________________________   INITIAL IMPRESSION / ASSESSMENT AND PLAN / ED COURSE  Pertinent labs &  imaging results that were available during my care of the patient were reviewed by me and considered in my medical decision making (see chart for details).  Patient presents with cramping sensations primarily in the hands bilaterally, electrolytes are normal.  No abnormality on exam.  Normal pulses.  No evidence of ischemia.  Could be radiculopathy related to nerve impingement but no muscular weakness.  Will give small dose of Ativan to see if that helps with her symptoms  Patient felt significantly better after Ativan.  Given reassuring exam, unremarkable labs will discharge home with Rx for Valium.  Asked her to DC Keflex and doxycycline as she has essentially finished her course as perhaps side effects from these medications are causing her symptoms.  Outpatient follow-up with PCP for further work-up    ____________________________________________   FINAL CLINICAL IMPRESSION(S) / ED DIAGNOSES  Final diagnoses:  Hand pain, left  Right hand pain  Cervical radiculopathy  Note:  This document was prepared using Dragon voice recognition software and may include unintentional dictation errors.    Lavonia Drafts, MD 01/07/18 [phone]

## 2018-01-15 ENCOUNTER — Encounter: Payer: Medicaid Other | Attending: Internal Medicine | Admitting: Internal Medicine

## 2018-01-15 DIAGNOSIS — L97221 Non-pressure chronic ulcer of left calf limited to breakdown of skin: Secondary | ICD-10-CM | POA: Diagnosis present

## 2018-01-15 DIAGNOSIS — I10 Essential (primary) hypertension: Secondary | ICD-10-CM | POA: Diagnosis not present

## 2018-01-15 DIAGNOSIS — Z8611 Personal history of tuberculosis: Secondary | ICD-10-CM | POA: Diagnosis not present

## 2018-01-15 DIAGNOSIS — I890 Lymphedema, not elsewhere classified: Secondary | ICD-10-CM | POA: Diagnosis not present

## 2018-01-15 DIAGNOSIS — J45909 Unspecified asthma, uncomplicated: Secondary | ICD-10-CM | POA: Diagnosis not present

## 2018-01-15 DIAGNOSIS — M1990 Unspecified osteoarthritis, unspecified site: Secondary | ICD-10-CM | POA: Diagnosis not present

## 2018-01-15 DIAGNOSIS — M109 Gout, unspecified: Secondary | ICD-10-CM | POA: Diagnosis not present

## 2018-01-15 DIAGNOSIS — G4730 Sleep apnea, unspecified: Secondary | ICD-10-CM | POA: Insufficient documentation

## 2018-01-15 DIAGNOSIS — Z6841 Body Mass Index (BMI) 40.0 and over, adult: Secondary | ICD-10-CM | POA: Insufficient documentation

## 2018-01-16 NOTE — Progress Notes (Signed)
[name], [name] ([id number]) Visit Report for 01/15/2018 Chief Complaint Document Details Patient Name: [name], [name]. Date of Service: 01/15/2018 9:45 AM Medical Record Number: [id number] Patient Account Number: [id number] Date of Birth/Sex: 08/03/1955 (62 y.o. F) Treating RN: Roger Shelter Primary Care Provider: Gabriel Rung Other Clinician: Referring Provider: Gabriel Rung Treating Provider/Extender: Tito Dine in Treatment: 0 Information Obtained from: Patient Chief Complaint Patient presents to the wound care center for a consult due non healing wound. 62 year old patient was known to the wound clinic from previous visits returns with massive lymphedema of the right lower extremity associated with redness and weeping for about 2 weeks 01/15/18; patient is here again for review weeping edema, epithelial loss on the left anterior and posterior. Electronic Signature(s) Signed: 01/15/2018 6:13:38 PM By: Linton Ham MD Entered By: Linton Ham on 01/15/2018 11:19:55 [name], [name] ([id number]) -------------------------------------------------------------------------------- HPI Details Patient Name: [name] Date of Service: 01/15/2018 9:45 AM Medical Record Number: [id number] Patient Account Number: [id number] Date of Birth/Sex: 1955-11-29 (62 y.o. F) Treating RN: Roger Shelter Primary Care Provider: Gabriel Rung Other Clinician: Referring Provider: Gabriel Rung Treating Provider/Extender: Tito Dine in Treatment: 0 History of Present Illness Location: right lower extremity Quality: Patient reports experiencing a dull pain to affected area(s). Severity: Patient states wound are getting worse. Duration: Patient has had the wound for < 2 weeks prior to presenting for treatment Timing: Pain in wound is Intermittent (comes and goes Context: The wound appeared gradually over time Modifying Factors: Consults to this date  include:seen by her PCP and recently put on clindamycin Associated Signs and Symptoms: Patient reports having difficulty standing for long periods. HPI Description: The patient is a 62 year old female with history of hypertension and a long-standing history of bilateral lower extremity lymphedema (first presented on 4/2) . She has had open ulcers in the past which have always responded to compression therapy. She had briefly been to a lymphedema clinic in the past which helped her at the time. this time around she stopped treatment of her lymphedema pumps approximately 2 weeks ago because of some pain in the knees and then noticed the right leg getting worse. She was seen by her PCP who put her on clindamycin 4 times a day 2 days ago. The patient has seen [facility] and Dr. Delana Meyer had seen her last year where a vascular study including venous and arterial duplex studies were within normal limits. he had recommended compression stockings and lymphedema pumps and the patient has been using this in about 2 weeks ago. She is known to be diabetic but in the past few time she's gone to her primary care doctor her hemoglobin A1c has been normal. 02/11/2015 - after her last visit she took my advice and went to the ER regarding the progressive cellulitis of her right lower extremity and she was admitted between July 17 and 22nd. She received IV antibiotics and then was sent home on a course of steroid-induced and oral antibiotics. She has improved much since then. 02/17/2015 -- she has been doing fine and the weeping of her legs has remarkably gone down. She has no fresh issues. READMISSION 01/15/18 This patient was given this clinic before most recently in 2016 seen by Dr. Con Memos. She has massive bilateral lymphedema and over the last 2 months this had weeping edema out of the left leg. She has compression pumps but her compliance with these has been minimal. She has [facility] they've been using  TCA/ABDs/kerlix under  an Ace wrap.she has had recent problems with cellulitis. She was apparently seen in the ER and 12/23/17 and given clindamycin. She was then followed by her primary doctor and given doxycycline and Keflex. The pain seems to have settled down. In April 2018 the patient had arterial studies done at [facility]. This showed triphasic waveforms throughout the right leg and mostly triphasic waveforms on the left except for monophasic at the posterior tibial artery distally. She was not felt to have evidence of right lower extremity arterial stenosis or significant problems on the left side. 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 She also had a right lower extremity venous Doppler in January 2018 this was limited by the patient's body habitus and lymphedema. Most of the proximal veins were not visualized The patient presents with an area of denuded skin on the anterior medial part of the left calf. There is weeping edema fluid here. Electronic Signature(s) KASHLYN, SALINAS ([id number]) Signed: 01/15/2018 6:13:38 PM By: Linton Ham MD Entered By: Linton Ham on 01/15/2018 11:26:15 [name] ([id number]) -------------------------------------------------------------------------------- Physical Exam Details Patient Name: [name], [name]. Date of Service: 01/15/2018 9:45 AM Medical Record Number: [id number] Patient Account Number: [id number] Date of Birth/Sex: [birth year] (62 y.o. F) Treating RN: Roger Shelter Primary Care Provider: Gabriel Rung Other Clinician: Referring Provider: Gabriel Rung Treating Provider/Extender: Tito Dine in Treatment: 0 Constitutional Patient is hypertensive.. Pulse regular and within target range for patient.Marland Kitchen Respirations regular, non-labored and within target range.. Temperature is normal and within the target range for the patient.. morbid obesity. Eyes Conjunctivae  clear. No discharge. Respiratory Respiratory effort is easy and symmetric bilaterally. Rate is normal at rest and on room air.. Bilateral breath sounds are clear and equal in all lobes with no wheezes, rales or rhonchi.. Cardiovascular JVP is visible at 45o but not elevated no S3. pedal pulses are palpable at the dorsalis pedis. massive nonpitting edema in both legs. From the mid calf down to her ankles there are skin changes associated with this nodules fissures cobblestone appearance. On the left there is loss of surface epithelium and weeping edema over a large part of the anterior medial calf. There is erythema but no tenderness I think this represents probably a component of venous inflammation. Gastrointestinal (GI) obese but no obvious tenderness. Lymphatic none palpable in the popliteal or inguinal area. Integumentary (Hair, Skin) chronic lymphedema with some degree of venous inflammation in the left calf. There are skin changes associated with long- standing stage III lymphedema nodules fissures cobblestoned appearance of the skin. Psychiatric No evidence of depression, anxiety, or agitation. Calm, cooperative, and communicative. Appropriate interactions and affect.. Electronic Signature(s) Signed: 01/15/2018 6:13:38 PM By: Linton Ham MD Entered By: Linton Ham on 01/15/2018 11:34:20 [name], [name] ([id number]) -------------------------------------------------------------------------------- Physician Orders Details Patient Name: [name] Date of Service: 01/15/2018 9:45 AM Medical Record Number: [id number] Patient Account Number: [id number] Date of Birth/Sex: Jan 31, 1956 (62 y.o. F) Treating RN: Roger Shelter Primary Care Provider: Gabriel Rung Other Clinician: Referring Provider: Gabriel Rung Treating Provider/Extender: Tito Dine in Treatment: 0 Verbal / Phone Orders: No Diagnosis Coding Wound Cleansing Wound #2 Left,Medial Lower  Leg o Clean wound with Normal Saline. Skin Barriers/Peri-Wound Care o Triamcinolone Acetonide Ointment (TCA) Primary Wound Dressing Wound #2 Left,Medial Lower Leg o Silver Alginate - place on red area on left lower leg Secondary Dressing Wound #2 Left,Medial Lower Leg o ABD pad o Conform/Kerlix - wrap around foot  to mid lower leg where the larger part of the leg begins Dressing Change Frequency Wound #2 Left,Medial Lower Leg o Change Dressing Monday, Wednesday, Friday Follow-up Appointments Wound #2 Left,Medial Lower Leg o Return Appointment in 1 week. Edema Control o 3 Layer Compression System - Left Lower Extremity - wrap 3 layer from foot up to the mid lower leg where the larger part of the leg begins o Compression Pump: Use compression pump on left lower extremity for 30 minutes, twice daily. - use one hour twice daily o Compression Pump: Use compression pump on right lower extremity for 30 minutes, twice daily. - use one hour twice daily Home Health Wound #2 [place] Nurse may visit PRN to address patientos wound care needs. o FACE TO FACE ENCOUNTER: MEDICARE and MEDICAID PATIENTS: I certify that this patient is under my care and that I had a face-to-face encounter that meets the physician face-to-face encounter requirements with this patient on this date. The encounter with the patient was in whole or in part for the following MEDICAL CONDITION: (primary reason for [place]) MEDICAL NECESSITY: I certify, that based on my findings, NURSING services are a medically necessary home health service. HOME BOUND STATUS: I certify that my clinical findings support that this patient is homebound (i.e., Due to illness or injury, pt requires aid of supportive devices such as crutches, cane, wheelchairs, walkers, the use of special transportation or the assistance of another person to leave their  place of residence. There is a normal inability to leave the home [place]lyn, Coin. ([id number]) and doing so requires considerable and taxing effort. Other absences are for medical reasons / religious services and are infrequent or of short duration when for other reasons). o If current dressing causes regression in wound condition, may D/C ordered dressing product/s and apply Normal Saline Moist Dressing daily until next [place] / Other MD appointment. [place] of regression in wound condition at [phone]. o Please direct any NON-WOUND related issues/requests for orders to patient's Primary Care Physician Electronic Signature(s) Signed: 01/15/2018 5:14:04 PM By: Roger Shelter Signed: 01/15/2018 6:13:38 PM By: Linton Ham MD Entered By: Roger Shelter on 01/15/2018 10:57:29 [place], [name] ([id number]) -------------------------------------------------------------------------------- Problem List Details Patient Name: [name], [name]. Date of Service: 01/15/2018 9:45 AM Medical Record Number: [id number] Patient Account Number: [id number] Date of Birth/Sex: [birth year] (62 y.o. 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 Date  of Service: 01/15/2018 9:45 AM Medical Record Number: [id number] Patient Account Number: [id number] Date of Birth/Sex: [birth year] (62 y.o. F) Treating RN: Roger Shelter Primary Care Provider: Gabriel Rung Other Clinician: Referring Provider: Gabriel Rung Treating Provider/Extender: Tito Dine in Treatment: 0 Subjective Chief Complaint Information obtained from Patient Patient presents to the wound care center for a consult due non healing wound. 62 year old patient was known to the wound clinic from previous visits returns with massive lymphedema of the right lower extremity associated with  redness and weeping for about 2 weeks 01/15/18; patient is here again for review weeping edema, epithelial loss on the left anterior and posterior. History of Present Illness (HPI) The following HPI elements were documented for the patient's wound: Location: right lower extremity Quality: Patient reports experiencing a dull pain to affected area(s). Severity: Patient states wound are getting worse. Duration: Patient has had the wound for < 2 weeks prior to presenting for treatment Timing: Pain in wound is Intermittent (comes and goes Context: The wound appeared gradually over time Modifying Factors: Consults to this date include:seen by her PCP and recently put on clindamycin Associated Signs and Symptoms: Patient reports having difficulty standing for long periods. The patient is a 62 year old female with history of hypertension and a long-standing history of bilateral lower extremity lymphedema (first presented on 4/2) . She has had open ulcers in the past which have always responded to compression therapy. She had briefly been to a lymphedema clinic in the past which helped her at the time. this time around she stopped treatment of her lymphedema pumps approximately 2 weeks ago because of some pain in the knees and then noticed the right leg getting worse. She was seen by her PCP  who put her on clindamycin 4 times a day 2 days ago. The patient has seen [facility] and Dr. Delana Meyer had seen her last year where a vascular study including venous and arterial duplex studies were within normal limits. he had recommended compression stockings and lymphedema pumps and the patient has been using this in about 2 weeks ago. She is known to be diabetic but in the past few time she's gone to her primary care doctor her hemoglobin A1c has been normal. 02/11/2015 - after her last visit she took my advice and went to the ER regarding the progressive cellulitis of her right lower extremity and she was admitted between July 17 and 22nd. She received IV antibiotics and then was sent home on a course of steroid-induced and oral antibiotics. She has improved much since then. 02/17/2015 -- she has been doing fine and the weeping of her legs has remarkably gone down. She has no fresh issues. READMISSION 01/15/18 This patient was given this clinic before most recently in 2016 seen by Dr. Con Memos. She has massive bilateral lymphedema and over the last 2 months this had weeping edema out of the left leg. She has compression pumps but her compliance with these has been minimal. She has [facility] they've been using TCA/ABDs/kerlix under an Ace wrap.she has had recent problems with cellulitis. She was apparently seen in the ER and 12/23/17 and given clindamycin. She was then followed by her primary doctor and given doxycycline and Keflex. The pain seems to have settled down. In April 2018 the patient had arterial studies done at [facility]. This showed triphasic waveforms Boch, [name] ([id number]) throughout the right leg and mostly triphasic waveforms on the left except for monophasic at the posterior tibial artery distally. She was not felt to have evidence of right lower extremity arterial stenosis or significant problems on the left side. She was noted to have possible left  posterior tibial artery disease. She also had a right lower extremity venous Doppler in January 2018 this was limited by the patient's body habitus and lymphedema. Most of the proximal veins were not visualized The patient presents with an area of denuded skin on the anterior medial part of the left calf. There is weeping edema fluid here. Wound  History Patient presents with 1 open wound that has been present for approximately 11/23/17. Patient has been treating wound in the following manner: kerlix and abd. Laboratory tests have not been performed in the last month. Patient reportedly has not tested positive for an antibiotic resistant organism. Patient reportedly has not tested positive for osteomyelitis. Patient reportedly has had testing performed to evaluate circulation in the legs. Patient experiences the following problems associated with their wounds: swelling. Patient History Information obtained from Patient. Allergies ibuprofen (Reaction: hives, swells), ACE Inhibitors Family History Cancer - Father, Diabetes - Father, Heart Disease - Mother, Hypertension - Mother,Father, Seizures - Mother, Stroke - Mother, Thyroid Problems - Mother,Siblings, No family history of Hereditary Spherocytosis, Kidney Disease, Lung Disease, Tuberculosis. Social History Former smoker - uses snuff, Marital Status - Single, Alcohol Use - Rarely, Drug Use - No History, Caffeine Use - Daily. 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 Eyes Complains or has symptoms of Glasses / Contacts. Ear/Nose/Mouth/Throat The patient has no complaints or symptoms. Hematologic/Lymphatic The patient has no complaints or symptoms. Cardiovascular enlarged heart Gastrointestinal The patient has no  complaints or symptoms. Endocrine Denies complaints or symptoms of Hepatitis, Thyroid disease, Polydypsia (Excessive Thirst). Genitourinary The patient has no complaints or symptoms. Immunological The patient has no complaints or symptoms. Integumentary (Skin) Complains or has symptoms of Wounds. Neurologic The patient has no complaints or symptoms. HERMA, UBALLE ([id number]) Oncologic The patient has no complaints or symptoms. Psychiatric Complains or has symptoms of Anxiety. Objective Constitutional Patient is hypertensive.. Pulse regular and within target range for patient.Marland Kitchen Respirations regular, non-labored and within target range.. Temperature is normal and within the target range for the patient.. morbid obesity. Vitals Time Taken: 10:06 AM, Height: 63 in, Source: Stated, Weight: 404.9 lbs, Source: Measured, BMI: 71.7, Temperature: 98.2 F, Pulse: 63 bpm, Respiratory Rate: 18 breaths/min, Blood Pressure: 146/87 mmHg. Eyes Conjunctivae clear. No discharge. Respiratory Respiratory effort is easy and symmetric bilaterally. Rate is normal at rest and on room air.. Bilateral breath sounds are clear and equal in all lobes with no wheezes, rales or rhonchi.. Cardiovascular JVP is visible at 45 but not elevated no S3. pedal pulses are palpable at the dorsalis pedis. massive nonpitting edema in both legs. From the mid calf down to her ankles there are skin changes associated with this nodules fissures cobblestone appearance. On the left there is loss of surface epithelium and weeping edema over a large part of the anterior medial calf. There is erythema but no tenderness I think this represents probably a component of venous inflammation. Gastrointestinal (GI) obese but no obvious tenderness. Lymphatic none palpable in the popliteal or inguinal area. Psychiatric No evidence of depression, anxiety, or agitation. Calm, cooperative, and communicative. Appropriate interactions and  affect.. Integumentary (Hair, Skin) chronic lymphedema with some degree of venous inflammation in the left calf. There are skin changes associated with long- standing stage III lymphedema nodules fissures cobblestoned appearance of the skin. Wound #2 status is Open. Original cause of wound was Gradually Appeared. The wound is located on the Left,Medial Lower Leg. The wound measures 12cm length x 19.5cm width x 0.1cm depth; 183.783cm^2 area and 18.378cm^3 volume. There is no tunneling or undermining noted. There is a large amount of serous drainage noted. The wound margin is distinct with the outline attached to the wound base. There  is large (67-100%) red granulation within the wound bed. There is a small (1-33%) amount of necrotic tissue within the wound bed including Adherent Slough. The periwound skin appearance exhibited: Erythema. The surrounding wound skin color is noted with erythema which is circumferential. Periwound temperature was noted as No Abnormality. The periwound has tenderness on palpation. [name], [name] ([id number]) Assessment Active Problems ICD-10 Non-pressure chronic ulcer of left calf limited to breakdown of skin Lymphedema, not elsewhere classified Plan Wound Cleansing: Wound #2 Left,Medial Lower Leg: Clean wound with Normal Saline. Skin Barriers/Peri-Wound Care: Triamcinolone Acetonide Ointment (TCA) Primary Wound Dressing: Wound #2 Left,Medial Lower Leg: Silver Alginate - place on red area on left lower leg Secondary Dressing: Wound #2 Left,Medial Lower Leg: ABD pad Conform/Kerlix - wrap around foot to mid lower leg where the larger part of the leg begins Dressing Change Frequency: Wound #2 Left,Medial Lower Leg: Change Dressing Monday, Wednesday, Friday Follow-up Appointments: Wound #2 Left,Medial Lower Leg: Return Appointment in 1 week. Edema Control: 3 Layer Compression System - Left Lower Extremity - wrap 3 layer from foot up to the mid lower leg  where the larger part of the leg begins Compression Pump: Use compression pump on left lower extremity for 30 minutes, twice daily. - use one hour twice daily Compression Pump: Use compression pump on right lower extremity for 30 minutes, twice daily. - use one hour twice daily Home Health: Wound #2 Left,Medial Lower Leg: [place] Nurse may visit PRN to address patient s wound care needs. FACE TO FACE ENCOUNTER: MEDICARE and MEDICAID PATIENTS: I certify that this patient is under my care and that I had a face-to-face encounter that meets the physician face-to-face encounter requirements with this patient on this date. The encounter with the patient was in whole or in part for the following MEDICAL CONDITION: (primary reason for [place]) MEDICAL NECESSITY: I certify, that based on my findings, NURSING services are a medically necessary home health service. HOME BOUND STATUS: I certify that my clinical findings support that this patient is homebound (i.e., Due to illness or injury, pt requires aid of supportive devices such as crutches, cane, wheelchairs, walkers, the use of special transportation or the assistance of another person to leave their place of residence. There is a normal inability to leave the home and doing so requires considerable and taxing effort. Other absences are for medical reasons / religious services and are infrequent or of short duration when for other reasons). If current dressing causes regression in wound condition, may D/C ordered dressing product/s and apply Normal Saline Moist Dressing daily until next [place] / Other MD appointment. [place] of regression in wound condition at [phone]. Please direct any NON-WOUND related issues/requests for orders to patient's Primary Care Physician [name], [name] ([id number]) #1 patient with absolutely massive bilateral lower extremity lymphedema. In  the left leg there is superficial skin/epithelial breakdown and weeping edema. We applied silver alginate/ABDs and put her in 3 layer compression. I would rather that then an Unna boot since the 3 layer compression will continue to add compression as hopefully we get edema out of her leg. #2 I have spoken to her in a very frank way about the necessity to use the compression pumps. Without this she is at risk of severe skin and ultimately tissue breakdown in the left greater than right leg. This would result in very painful lower extremity wounds #3 she has been recently treated for cellulitis  including using doxycycline which she apparently had some form of reaction in her hands to it. In any case she is off antibiotics and I see no evidence of cellulitis currently. There is some brawny erythema which is likely secondary to stasis dermatitis #4 the patient is difficult to examine with regards to PAD however studies done in 2018 about a year and 3 months ago did not show evidence of significant arterial disease. I am doubtful that this is a case #5 the patient has in our notes a history of diabetes although she absolutely denies this and is not on any current treatment and I do not see a hemoglobin A1c on her in the [place] Signature(s) Signed: 01/15/2018 6:13:38 PM By: Linton Ham MD Entered By: Linton Ham on 01/15/2018 11:37:39 Cada, [name] ([id number]) -------------------------------------------------------------------------------- ROS/PFSH Details Patient Name: [name]. Date of Service: 01/15/2018 9:45 AM Medical Record Number: [id number] Patient Account Number: [id number] Date of Birth/Sex: 1955-09-24 (62 y.o. F) Treating RN: Ahmed Prima Primary Care Provider: Gabriel Rung Other Clinician: Referring Provider: Gabriel Rung Treating Provider/Extender: Tito Dine in Treatment: 0 Information Obtained From Patient Wound  History Do you currently have one or more open woundso Yes How many open wounds do you currently haveo 1 Approximately how long have you had your woundso 11/23/17 How have you been treating your wound(s) until nowo kerlix and abd Has your wound(s) ever healed and then re-openedo No Have you had any lab work done in the past montho No Have you tested positive for an antibiotic resistant organism (MRSA, VRE)o No Have you tested positive for osteomyelitis (bone infection)o No Have you had any tests for circulation on your legso Yes Who ordered the testo [facility] Have you had other problems associated with your woundso Swelling Eyes Complaints and Symptoms: Positive for: Glasses / Contacts Medical History: Positive for: Cataracts Negative for: Glaucoma; Optic Neuritis Endocrine Complaints and Symptoms: Negative for: Hepatitis; Thyroid disease; Polydypsia (Excessive Thirst) Medical History: Negative for: Type I Diabetes; Type II Diabetes Integumentary (Skin) Complaints and Symptoms: Positive for: Wounds Medical History: Negative for: History of Burn; History of pressure wounds Psychiatric Complaints and Symptoms: Positive for: Anxiety Medical History: Negative for: Anorexia/bulimia; Confinement Anxiety Constitutional Symptoms (General Health) [name], [name] ([id number]) Complaints and Symptoms: No Complaints or Symptoms Ear/Nose/Mouth/Throat Complaints and Symptoms: No Complaints or Symptoms Medical History: Negative for: Chronic sinus problems/congestion; Middle ear problems Hematologic/Lymphatic Complaints and Symptoms: No Complaints or Symptoms Medical History: Positive for: Lymphedema Negative for: Anemia; Hemophilia; Human Immunodeficiency Virus; Sickle Cell Disease Respiratory Medical History: Positive for: Asthma - controlled; Sleep Apnea - C-pap; 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 Negative for: Aspiration; Chronic Obstructive Pulmonary Disease (COPD);  Pneumothorax Cardiovascular Complaints and Symptoms: Review of System Notes: enlarged heart Medical History: Positive for: Hypertension Negative for: Angina; Arrhythmia; Congestive Heart Failure; Coronary Artery Disease; Deep Vein Thrombosis; Hypotension; Myocardial Infarction; Peripheral Arterial Disease; Peripheral Venous Disease; Phlebitis; Vasculitis Gastrointestinal Complaints and Symptoms: No Complaints or Symptoms Medical History: Negative for: Cirrhosis ; Colitis; Crohnos; Hepatitis A; Hepatitis B; Hepatitis C Genitourinary Complaints and Symptoms: No Complaints or Symptoms Medical History: Negative for: End Stage Renal Disease Immunological Complaints and Symptoms: No Complaints or Symptoms Medical HistorySHARLET, [name] ([id number]) Negative for: Lupus Erythematosus; Raynaudos; Scleroderma Musculoskeletal Medical History: Positive for: Gout; Osteoarthritis Negative for: Rheumatoid Arthritis; Osteomyelitis Neurologic Complaints and Symptoms: No Complaints or Symptoms Medical History: Negative for: Dementia; Neuropathy; Quadriplegia; Paraplegia; Seizure Disorder Oncologic Complaints and Symptoms: No Complaints  or Symptoms Medical History: Negative for: Received Chemotherapy; Received Radiation HBO Extended History Items Eyes: Cataracts Immunizations Pneumococcal Vaccine: Received Pneumococcal Vaccination: Yes Implantable Devices Family and Social History Cancer: Yes - Father; Diabetes: Yes - Father; Heart Disease: Yes - Mother; Hereditary Spherocytosis: No; Hypertension: Yes - Mother,Father; Kidney Disease: No; Lung Disease: No; Seizures: Yes - Mother; Stroke: Yes - Mother; Thyroid Problems: Yes - Mother,Siblings; Tuberculosis: No; Former smoker - uses snuff; Marital Status - Single; Alcohol Use: Rarely; Drug Use: No History; Caffeine Use: Daily; Financial Concerns: No; Food, Clothing or Shelter Needs: No; Support System Lacking: No; Transportation  Concerns: No; Advanced Directives: No; Patient does not want information on Advanced Directives; Do not resuscitate: No; Living Will: No; Medical Power of Attorney: No Electronic Signature(s) Signed: 01/15/2018 5:25:23 PM By: Alric Quan Signed: 01/15/2018 6:13:38 PM By: Linton Ham MD Entered By: Alric Quan on 01/15/2018 10:15:41 [name] ([id number]) -------------------------------------------------------------------------------- SuperBill Details Patient Name: [name]. Date of Service: 01/15/2018 Medical Record Number: [id number] Patient Account Number: [id number] Date of Birth/Sex: 1956/02/04 (62 y.o. F) Treating RN: Roger Shelter Primary Care Provider: Gabriel Rung Other Clinician: Referring Provider: Gabriel Rung Treating Provider/Extender: Tito Dine in Treatment: 0 Diagnosis Coding ICD-10 Codes Code Description [phone] Non-pressure chronic ulcer of left calf limited to breakdown of skin I89.0 Lymphedema, not elsewhere classified Facility Procedures CPT4 Code: [id number] Description: 99213 - WOUND CARE VISIT-LEV 3 EST PT Modifier: Quantity: 1 Physician Procedures CPT4 Code: 1410301 Description: 31438 - WC PHYS LEVEL 4 - EST PT ICD-10 Diagnosis Description L97.221 Non-pressure chronic ulcer of left calf limited to breakdown I89.0 Lymphedema, not elsewhere classified Modifier: of skin Quantity: 1 Electronic Signature(s) Signed: 01/15/2018 6:13:38 PM By: Linton Ham MD Entered By: Linton Ham on 01/15/2018 11:39:12

## 2018-01-16 NOTE — Progress Notes (Signed)
SOUA, LENK ([id number]) Visit Report for 01/15/2018 Allergy List Details Patient Name: [name], [name]. Date of Service: 01/15/2018 9:45 AM Medical Record Number: [id number] Patient Account Number: [id number] Date of Birth/Sex: [birth year] (62 y.o. F) Treating RN: Ahmed Prima Primary Care Nyjah Denio: Gabriel Rung Other Clinician: Referring Paitynn Mikus: Gabriel Rung Treating Cassiel Fernandez/Extender: Ricard Dillon Weeks in Treatment: 0 Allergies Active Allergies ibuprofen Reaction: hives, swells ACE Inhibitors Allergy Notes Electronic Signature(s) Signed: 01/15/2018 5:25:23 PM By: Alric Quan Entered By: Alric Quan on 01/15/2018 10:09:23 [name] ([id number]) -------------------------------------------------------------------------------- Arrival Information Details Patient Name: [name]. Date of Service: 01/15/2018 9:45 AM Medical Record Number: [id number] Patient Account Number: [id number] Date of Birth/Sex: 1955/10/27 (62 y.o. F) Treating RN: Ahmed Prima Primary Care Arsal Tappan: Gabriel Rung Other Clinician: Referring Javonta Gronau: Gabriel Rung Treating Coy Vandoren/Extender: Tito Dine in Treatment: 0 Visit Information Patient Arrived: Cane Arrival Time: 10:04 Accompanied By: self Transfer Assistance: EasyPivot Patient Lift Patient Identification Verified: Yes Secondary Verification Process Yes Completed: Patient Requires Transmission-Based No Precautions: Patient Has Alerts: No History Since Last Visit All ordered tests and consults were completed: No Added or deleted any medications: No Any new allergies or adverse reactions: No Had a fall or experienced change in activities of daily living that may affect risk of falls: No Signs or symptoms of abuse/neglect since last visito No Hospitalized since last visit: No Implantable device outside of the clinic excluding cellular tissue based products placed in the center  since last visit: No Electronic Signature(s) Signed: 01/15/2018 5:25:23 PM By: Alric Quan Entered By: Alric Quan on 01/15/2018 10:05:33 [name] ([id number]) -------------------------------------------------------------------------------- Clinic Level of Care Assessment Details Patient Name: [name]. Date of Service: 01/15/2018 9:45 AM Medical Record Number: [id number] Patient Account Number: [id number] Date of Birth/Sex: [birth year] (62 y.o. F) Treating RN: Roger Shelter Primary Care Laporcha Marchesi: Gabriel Rung Other Clinician: Referring Aniylah Avans: Gabriel Rung Treating Kimber Esterly/Extender: Tito Dine in Treatment: 0 Clinic Level of Care Assessment Items TOOL 2 Quantity Score X - Use when only an EandM is performed on the INITIAL visit 1 0 ASSESSMENTS - Nursing Assessment / Reassessment X - General Physical Exam (combine w/ comprehensive assessment (listed just below) when 1 20 performed on new pt. evals) X- 1 25 Comprehensive Assessment (HX, ROS, Risk Assessments, Wounds Hx, etc.) ASSESSMENTS - Wound and Skin Assessment / Reassessment X - Simple Wound Assessment / Reassessment - one wound 1 5 []  - 0 Complex Wound Assessment / Reassessment - multiple wounds X- 1 10 Dermatologic / Skin Assessment (not related to wound area) ASSESSMENTS - Ostomy and/or Continence Assessment and Care []  - Incontinence Assessment and Management 0 []  - 0 Ostomy Care Assessment and Management (repouching, etc.) PROCESS - Coordination of Care X - Simple Patient / Family Education for ongoing care 1 15 []  - 0 Complex (extensive) Patient / Family Education for ongoing care []  - 0 Staff obtains Programmer, systems, Records, Test Results / Process Orders []  - 0 Staff telephones HHA, Nursing Homes / Clarify orders / etc []  - 0 Routine Transfer to another Facility (non-emergent condition) []  - 0 Routine Hospital Admission (non-emergent condition) []  - 0 New Admissions  / Biomedical engineer / Ordering NPWT, Apligraf, etc. []  - 0 Emergency Hospital Admission (emergent condition) X- 1 10 Simple Discharge Coordination []  - 0 Complex (extensive) Discharge Coordination PROCESS - Special Needs []  - Pediatric / Minor Patient Management 0 []  - 0 Isolation Patient Management [name], [name] ([id number]) []  - 0 Hearing / Language /  Visual special needs []  - 0 Assessment of Community assistance (transportation, D/C planning, etc.) []  - 0 Additional assistance / Altered mentation []  - 0 Support Surface(s) Assessment (bed, cushion, seat, etc.) INTERVENTIONS - Wound Cleansing / Measurement X - Wound Imaging (photographs - any number of wounds) 1 5 []  - 0 Wound Tracing (instead of photographs) X- 1 5 Simple Wound Measurement - one wound []  - 0 Complex Wound Measurement - multiple wounds X- 1 5 Simple Wound Cleansing - one wound []  - 0 Complex Wound Cleansing - multiple wounds INTERVENTIONS - Wound Dressings X - Small Wound Dressing one or multiple wounds 1 10 []  - 0 Medium Wound Dressing one or multiple wounds []  - 0 Large Wound Dressing one or multiple wounds []  - 0 Application of Medications - injection INTERVENTIONS - Miscellaneous []  - External ear exam 0 []  - 0 Specimen Collection (cultures, biopsies, blood, body fluids, etc.) []  - 0 Specimen(s) / Culture(s) sent or taken to Lab for analysis []  - 0 Patient Transfer (multiple staff / Civil Service fast streamer / Similar devices) []  - 0 Simple Staple / Suture removal (25 or less) []  - 0 Complex Staple / Suture removal (26 or more) []  - 0 Hypo / Hyperglycemic Management (close monitor of Blood Glucose) []  - 0 Ankle / Brachial Index (ABI) - do not check if billed separately Has the patient been seen at the hospital within the last three years: Yes Total Score: 110 Level Of Care: New/Established - Level 3 Electronic Signature(s) Signed: 01/15/2018 5:14:04 PM By: Roger Shelter Entered By:  Roger Shelter on 01/15/2018 10:58:57 [name], [name] ([id number]) -------------------------------------------------------------------------------- Encounter Discharge Information Details Patient Name: [name]. Date of Service: 01/15/2018 9:45 AM Medical Record Number: [id number] Patient Account Number: [id number] Date of Birth/Sex: Jul 26, 1955 (62 y.o. F) Treating RN: Montey Hora Primary Care Jafeth Mustin: Gabriel Rung Other Clinician: Referring Russie Gulledge: Gabriel Rung Treating Glen Kesinger/Extender: Tito Dine in Treatment: 0 Encounter Discharge Information Items Discharge Condition: Stable Ambulatory Status: Cane Discharge Destination: Home Transportation: Private Auto Accompanied By: self Schedule Follow-up Appointment: Yes Clinical Summary of Care: Electronic Signature(s) Signed: 01/15/2018 12:06:47 PM By: Montey Hora Entered By: Montey Hora on 01/15/2018 12:06:47 [name] ([id number]) -------------------------------------------------------------------------------- Lower Extremity Assessment Details Patient Name: Elizabeth Landau C. Date of Service: 01/15/2018 9:45 AM Medical Record Number: [id number] Patient Account Number: [id number] Date of Birth/Sex: 1956/02/13 (62 y.o. F) Treating RN: Ahmed Prima Primary Care Nyeema Want: Gabriel Rung Other Clinician: Referring Shaquasha Gerstel: Gabriel Rung Treating Greggory Safranek/Extender: Tito Dine in Treatment: 0 Edema Assessment Assessed: [Left: No] [Right: No] [Left: Edema] [Right: :] Ankle Left: Right: Point of Measurement: 12 cm From Medial Instep 55.6 cm 54.8 cm Vascular Assessment Pulses: Dorsalis Pedis Palpable: [Left:Yes] [Right:Yes] Posterior Tibial Extremity colors, hair growth, and conditions: Extremity Color: [Left:Red] [Right:Red] Hair Growth on Extremity: [Left:No] [Right:No] Temperature of Extremity: [Left:Warm] [Right:Cool] Capillary Refill: [Left:< 3 seconds]  [Right:< 3 seconds] Toe Nail Assessment Left: Right: Thick: No No Discolored: No No Deformed: No No Improper Length and Hygiene: No No Notes unable to measure calves d/t size unable to ABIs d/t size Electronic Signature(s) Signed: 01/15/2018 5:25:23 PM By: Alric Quan Entered By: Alric Quan on 01/15/2018 10:26:54 [name], [name] ([id number]) -------------------------------------------------------------------------------- Multi Wound Chart Details Patient Name: [name]. Date of Service: 01/15/2018 9:45 AM Medical Record Number: [id number] Patient Account Number: [id number] Date of Birth/Sex: 12-12-1955 (62 y.o. F) Treating RN: Roger Shelter Primary Care Telisha Zawadzki: Gabriel Rung Other Clinician: Referring Reyanna [name]: Gabriel Rung Treating Noam Karaffa/Extender:  ROBSON, MICHAEL G Weeks in Treatment: 0 Vital Signs Height(in): 63 Pulse(bpm): 64 Weight(lbs): 404.9 Blood Pressure(mmHg): 146/87 Body Mass Index(BMI): 72 Temperature(F): 98.2 Respiratory Rate 18 (breaths/min): Photos: [2:No Photos] [N/A:N/A] Wound Location: [2:Left Lower Leg - Medial] [N/A:N/A] Wounding Event: [2:Gradually Appeared] [N/A:N/A] Primary Etiology: [2:Lymphedema] [N/A:N/A] Comorbid History: [2:Cataracts, Lymphedema, Asthma, Sleep Apnea, Tuberculosis, Hypertension, Gout, Osteoarthritis] [N/A:N/A] Date Acquired: [2:11/23/2017] [N/A:N/A] Weeks of Treatment: [2:0] [N/A:N/A] Wound Status: [2:Open] [N/A:N/A] Measurements L x W x D [2:12x19.5x0.1] [N/A:N/A] (cm) Area (cm) : [2:183.783] [N/A:N/A] Volume (cm) : [2:18.378] [N/A:N/A] Classification: [2:Partial Thickness] [N/A:N/A] Exudate Amount: [2:Large] [N/A:N/A] Exudate Type: [2:Serous] [N/A:N/A] Exudate Color: [2:amber] [N/A:N/A] Wound Margin: [2:Distinct, outline attached] [N/A:N/A] Granulation Amount: [2:Large (67-100%)] [N/A:N/A] Granulation Quality: [2:Red] [N/A:N/A] Necrotic Amount: [2:Small (1-33%)] [N/A:N/A] Exposed  Structures: [2:Fascia: No Fat Layer (Subcutaneous Tissue) Exposed: No Tendon: No Muscle: No Joint: No Bone: No] [N/A:N/A] Epithelialization: [2:None] [N/A:N/A] Periwound Skin Texture: [2:No Abnormalities Noted] [N/A:N/A] Periwound Skin Moisture: [2:No Abnormalities Noted] [N/A:N/A] Periwound Skin Color: [2:Erythema: Yes] [N/A:N/A] Erythema Location: [2:Circumferential] [N/A:N/A] Temperature: [2:No Abnormality] [N/A:N/A] Tenderness on Palpation: [2:Yes] [N/A:N/A] Wound Preparation: Ulcer Cleansing: N/A N/A Rinsed/Irrigated with Saline Topical Anesthetic Applied: Other: lidocaine 4% Treatment Notes Electronic Signature(s) Signed: 01/15/2018 6:13:38 PM By: Linton Ham MD Entered By: Linton Ham on 01/15/2018 11:18:48 [name] ([id number]) -------------------------------------------------------------------------------- [place] Details Patient Name: [name], NIES. Date of Service: 01/15/2018 9:45 AM Medical Record Number: [id number] Patient Account Number: [id number] Date of Birth/Sex: [birth year] (62 y.o. F) Treating RN: Roger Shelter Primary Care Sakina Briones: Gabriel Rung Other Clinician: Referring Gwendy Boeder: Gabriel Rung Treating Montae Stager/Extender: Tito Dine in Treatment: 0 Active Inactive ` Orientation to the Wound Care Program Nursing Diagnoses: Knowledge deficit related to the wound healing center program Goals: Patient/caregiver will verbalize understanding of the [place] Program Date Initiated: 01/15/2018 Target Resolution Date: 02/12/2018 Goal Status: Active Interventions: Provide education on orientation to the wound center Notes: ` Wound/Skin Impairment Nursing Diagnoses: Impaired tissue integrity Goals: Patient/caregiver will verbalize understanding of skin care regimen Date Initiated: 01/15/2018 Target Resolution Date: 02/12/2018 Goal Status: Active Ulcer/skin breakdown will have a volume  reduction of 30% by week 4 Date Initiated: 01/15/2018 Target Resolution Date: 02/12/2018 Goal Status: Active Interventions: Assess patient/caregiver ability to obtain necessary supplies Assess patient/caregiver ability to perform ulcer/skin care regimen upon admission and as needed Assess ulceration(s) every visit Treatment Activities: Patient referred to home care : 01/15/2018 Skin care regimen initiated : 01/15/2018 Notes: Electronic Signature(s) Signed: 01/15/2018 5:14:04 PM By: Ascencion Dike ([id number]) Entered By: Roger Shelter on 01/15/2018 10:43:19 [name], [name] ([id number]) -------------------------------------------------------------------------------- Non-Wound Condition Assessment Details Patient Name: [name]. Date of Service: 01/15/2018 9:45 AM Medical Record Number: [id number] Patient Account Number: [id number] Date of Birth/Sex: May 03, 1956 (62 y.o. F) Treating RN: Ahmed Prima Primary Care Cana Mignano: Gabriel Rung Other Clinician: Referring Jaicey Sweaney: Gabriel Rung Treating Draylen Lobue/Extender: Ricard Dillon Weeks in Treatment: 0 Non-Wound Condition: Condition: Lymphedema Location: Leg Side: Right Photos Periwound Skin Texture Texture Color No Abnormalities Noted: No No Abnormalities Noted: No Moisture No Abnormalities Noted: No Electronic Signature(s) Unsigned Entered By: Alric Quan on 01/16/2018 09:23:48 Signature(s): Date(s): [name] ([id number]) -------------------------------------------------------------------------------- Pain Assessment Details Patient Name: [name], [name]. Date of Service: 01/15/2018 9:45 AM Medical Record Number: [id number] Patient Account Number: [id number] Date of Birth/Sex: September 26, 1955 (62 y.o. F) Treating RN: Ahmed Prima Primary Care Jermani Eberlein: Gabriel Rung Other Clinician: Referring Kymia Simi: Gabriel Rung Treating Aarica Wax/Extender: Tito Dine  in Treatment: 0 Active Problems Location  of Pain Severity and Description of Pain Patient Has Paino Yes Site Locations Pain Location: Generalized Pain Rate the pain. Current Pain Level: 4 Character of Pain Describe the Pain: Aching Pain Management and Medication Current Pain Management: Notes Topical or injectable lidocaine is offered to patient for acute pain when surgical debridement is performed. If needed, Patient is instructed to use over the counter pain medication for the following 24-48 hours after debridement. Wound care MDs do not prescribed pain medications. Patient has chronic pain or uncontrolled pain. Patient has been instructed to make an appointment with their Primary Care Physician for pain management. Electronic Signature(s) Signed: 01/15/2018 5:25:23 PM By: Alric Quan Entered By: Alric Quan on 01/15/2018 10:06:15 [name] ([id number]) -------------------------------------------------------------------------------- Patient/Caregiver Education Details Patient Name: [name] Date of Service: 01/15/2018 9:45 AM Medical Record Number: [id number] Patient Account Number: [id number] Date of Birth/Gender: Mar 30, 1956 (62 y.o. F) Treating RN: Montey Hora Primary Care Physician: Gabriel Rung Other Clinician: Referring Physician: Gabriel Rung Treating Physician/Extender: Tito Dine in Treatment: 0 Education Assessment Education Provided To: Patient Education Topics Provided Venous: Handouts: Other: use your pumps at least twice daily Methods: Explain/Verbal Responses: State content correctly Electronic Signature(s) Signed: 01/15/2018 5:21:12 PM By: Montey Hora Entered By: Montey Hora on 01/15/2018 12:07:46 [name], [name] ([id number]) -------------------------------------------------------------------------------- Wound Assessment Details Patient Name: Elizabeth Landau C. Date of Service: 01/15/2018 9:45  AM Medical Record Number: [id number] Patient Account Number: [id number] Date of Birth/Sex: Feb 26, 1956 (62 y.o. F) Treating RN: Ahmed Prima Primary Care Iliana Hutt: Gabriel Rung Other Clinician: Referring Demarious Kapur: Gabriel Rung Treating Teshara Moree/Extender: Ricard Dillon Weeks in Treatment: 0 Wound Status Wound Number: 2 Primary Lymphedema Etiology: Wound Location: Left Lower Leg - Medial Wound Open Wounding Event: Gradually Appeared Status: Date Acquired: 11/23/2017 Comorbid Cataracts, Lymphedema, Asthma, Sleep Weeks Of Treatment: 0 History: Apnea, Tuberculosis, Hypertension, Gout, Clustered Wound: No Osteoarthritis Photos Photo Uploaded By: Alric Quan on 01/16/2018 09:22:42 Wound Measurements Length: (cm) 12 Width: (cm) 19.5 Depth: (cm) 0.1 Area: (cm) 183.783 Volume: (cm) 18.378 % Reduction in Area: % Reduction in Volume: Epithelialization: None Tunneling: No Undermining: No Wound Description Classification: Partial Thickness Wound Margin: Distinct, outline attached Exudate Amount: Large Exudate Type: Serous Exudate Color: amber Foul Odor After Cleansing: No Slough/Fibrino Yes Wound Bed Granulation Amount: Large (67-100%) Exposed Structure Granulation Quality: Red Fascia Exposed: No Necrotic Amount: Small (1-33%) Fat Layer (Subcutaneous Tissue) Exposed: No Necrotic Quality: Adherent Slough Tendon Exposed: No Muscle Exposed: No Joint Exposed: No Bone Exposed: No Periwound Skin Texture [name], [name] ([id number]) Texture Color No Abnormalities Noted: No No Abnormalities Noted: No Erythema: Yes Moisture Erythema Location: Circumferential No Abnormalities Noted: No Temperature / Pain Temperature: No Abnormality Tenderness on Palpation: Yes Wound Preparation Ulcer Cleansing: Rinsed/Irrigated with Saline Topical Anesthetic Applied: Other: lidocaine 4%, Treatment Notes Wound #2 (Left, Medial Lower Leg) 1. Cleansed with: Clean  wound with Normal Saline 3. Peri-wound Care: Other peri-wound care (specify in notes) 4. Dressing Applied: Calcium Alginate with Silver 5. Secondary Dressing Applied ABD Pad 7. Secured with 3 Layer Compression System - Left Lower Extremity Notes TCA, unna to anchor Electronic Signature(s) Signed: 01/15/2018 5:25:23 PM By: Alric Quan Entered By: Alric Quan on 01/15/2018 10:31:39 [name], [name] ([id number]) -------------------------------------------------------------------------------- Vitals Details Patient Name: [name]. Date of Service: 01/15/2018 9:45 AM Medical Record Number: [id number] Patient Account Number: [id number] Date of Birth/Sex: 19-Dec-1955 (62 y.o. F) Treating RN: Ahmed Prima Primary Care Odeth Bry: Gabriel Rung Other Clinician: Referring Joenathan Sakuma: Gabriel Rung Treating Santosh Petter/Extender: Dellia Nims  MICHAEL G Weeks in Treatment: 0 Vital Signs Time Taken: 10:06 Temperature (F): 98.2 Height (in): 63 Pulse (bpm): 63 Source: Stated Respiratory Rate (breaths/min): 18 Weight (lbs): 404.9 Blood Pressure (mmHg): 146/87 Source: Measured Reference Range: 80 - 120 mg / dl Body Mass Index (BMI): 71.7 Electronic Signature(s) Signed: 01/15/2018 5:25:23 PM By: Alric Quan Entered By: Alric Quan on 01/15/2018 10:08:03

## 2018-01-16 NOTE — Progress Notes (Signed)
NICKOL, COLLISTER ([id number]) Visit Report for 01/15/2018 Abuse/Suicide Risk Screen Details Patient Name: [name], [name]. Date of Service: 01/15/2018 9:45 AM Medical Record Number: [id number] Patient Account Number: [id number] Date of Birth/Sex: 1956/01/03 (62 y.o. F) Treating RN: Ahmed Prima Primary Care Kalyb Pemble: Gabriel Rung Other Clinician: Referring Kamesha Herne: Gabriel Rung Treating Chelsey Kimberley/Extender: Tito Dine in Treatment: 0 Abuse/Suicide Risk Screen Items Answer ABUSE/SUICIDE RISK SCREEN: Has anyone close to you tried to hurt or harm you recentlyo No Do you feel uncomfortable with anyone in your familyo No Has anyone forced you do things that you didnot want to doo No Do you have any thoughts of harming yourselfo No Patient displays signs or symptoms of abuse and/or neglect. No Electronic Signature(s) Signed: 01/15/2018 5:25:23 PM By: Alric Quan Entered By: Alric Quan on 01/15/2018 10:15:49 Corron, [name] ([id number]) -------------------------------------------------------------------------------- Activities of Daily Living Details Patient Name: [name], [name]. Date of Service: 01/15/2018 9:45 AM Medical Record Number: [id number] Patient Account Number: [id number] Date of Birth/Sex: 02-23-1956 (62 y.o. F) Treating RN: Ahmed Prima Primary Care Dewana Ammirati: Gabriel Rung Other Clinician: Referring Aveen Stansel: Gabriel Rung Treating Corwin Kuiken/Extender: Tito Dine in Treatment: 0 Activities of Daily Living Items Answer Activities of Daily Living (Please select one for each item) Drive Automobile Not Able Take Medications Completely Able Use Telephone Completely Able Care for Appearance Completely Able Use Toilet Completely Able Bath / Shower Completely Able Dress Self Completely Able Feed Self Completely Able Walk Completely Able Get In / Out Bed Completely Able Housework Completely Able Prepare Meals Completely  [place] for Self Completely Able Electronic Signature(s) Signed: 01/15/2018 5:25:23 PM By: Alric Quan Entered By: Alric Quan on 01/15/2018 10:16:21 [name] ([id number]) -------------------------------------------------------------------------------- Education Assessment Details Patient Name: [name]. Date of Service: 01/15/2018 9:45 AM Medical Record Number: [id number] Patient Account Number: [id number] Date of Birth/Sex: November 05, 1955 (62 y.o. F) Treating RN: Ahmed Prima Primary Care Terressa Evola: Gabriel Rung Other Clinician: Referring Talon Witting: Gabriel Rung Treating Marenda Accardi/Extender: Tito Dine in Treatment: 0 Primary Learner Assessed: Patient Learning Preferences/Education Level/Primary Language Learning Preference: Explanation, Printed Material Highest Education Level: College or Above Preferred Language: English Cognitive Barrier Assessment/Beliefs Language Barrier: No Translator Needed: No Memory Deficit: No Emotional Barrier: No Cultural/Religious Beliefs Affecting Medical Care: No Physical Barrier Assessment Impaired Vision: Yes Glasses Impaired Hearing: No Decreased Hand dexterity: No Knowledge/Comprehension Assessment Knowledge Level: Medium Comprehension Level: Medium Ability to understand written Medium instructions: Ability to understand verbal Medium instructions: Motivation Assessment Anxiety Level: Calm Cooperation: Cooperative Education Importance: Acknowledges Need Interest in Health Problems: Asks Questions Perception: Coherent Willingness to Engage in Self- Medium Management Activities: Readiness to Engage in Self- Medium Management Activities: Electronic Signature(s) Signed: 01/15/2018 5:25:23 PM By: Alric Quan Entered By: Alric Quan on 01/15/2018 10:16:44 [name]  ([id number]) -------------------------------------------------------------------------------- Fall Risk Assessment Details Patient Name: [name]. Date of Service: 01/15/2018 9:45 AM Medical Record Number: [id number] Patient Account Number: [id number] Date of Birth/Sex: [birth year] (62 y.o. F) Treating RN: Ahmed Prima Primary Care Gudelia Eugene: Gabriel Rung Other Clinician: Referring Sophiana Milanese: Gabriel Rung Treating Tomi Grandpre/Extender: Tito Dine in Treatment: 0 Fall Risk Assessment Items Have you had 2 or more falls in the last 12 monthso 0 No Have you had any fall that resulted in injury in the last 12 monthso 0 No FALL RISK ASSESSMENT: History of falling - immediate or within 3 months 0 No Secondary diagnosis 15 Yes Ambulatory aid None/bed rest/wheelchair/nurse 0 No Crutches/cane/walker 15  Yes Furniture 0 No IV Access/Saline Lock 0 No Gait/Training Normal/bed rest/immobile 0 No Weak 0 No Impaired 20 Yes Mental Status Oriented to own ability 0 Yes Electronic Signature(s) Signed: 01/15/2018 5:25:23 PM By: Alric Quan Entered By: Alric Quan on 01/15/2018 10:17:50 Burroughs, [name] ([id number]) -------------------------------------------------------------------------------- Foot Assessment Details Patient Name: Elizabeth Landau C. Date of Service: 01/15/2018 9:45 AM Medical Record Number: [id number] Patient Account Number: [id number] Date of Birth/Sex: 1956/02/26 (62 y.o. F) Treating RN: Ahmed Prima Primary Care Ailee Pates: Gabriel Rung Other Clinician: Referring Nolah Krenzer: Gabriel Rung Treating Adaia Matthies/Extender: Tito Dine in Treatment: 0 Foot Assessment Items Site Locations + = Sensation present, - = Sensation absent, C = Callus, U = Ulcer R = Redness, W = Warmth, M = Maceration, PU = Pre-ulcerative lesion F = Fissure, S = Swelling, D = Dryness Assessment Right: Left: Other Deformity: No No Prior Foot Ulcer: No  No Prior Amputation: No No Charcot Joint: No No Ambulatory Status: Ambulatory With Help Assistance Device: Cane Gait: Steady Electronic Signature(s) Signed: 01/15/2018 5:25:23 PM By: Alric Quan Entered By: Alric Quan on 01/15/2018 10:22:21 Shovlin, [name] ([id number]) -------------------------------------------------------------------------------- Nutrition Risk Assessment Details Patient Name: [name]. Date of Service: 01/15/2018 9:45 AM Medical Record Number: [id number] Patient Account Number: [id number] Date of Birth/Sex: 04-28-1956 (62 y.o. F) Treating RN: Ahmed Prima Primary Care Stachia Slutsky: Gabriel Rung Other Clinician: Referring Wynter Isaacs: Gabriel Rung Treating Joah Patlan/Extender: Tito Dine in Treatment: 0 Height (in): 63 Weight (lbs): 404.9 Body Mass Index (BMI): 71.7 Nutrition Risk Assessment Items NUTRITION RISK SCREEN: I have an illness or condition that made me change the kind and/or amount of 2 Yes food I eat I eat fewer than two meals per day 3 Yes I eat few fruits and vegetables, or milk products 0 No I have three or more drinks of beer, liquor or wine almost every day 0 No I have tooth or mouth problems that make it hard for me to eat 0 No I don't always have enough money to buy the food I need 0 No I eat alone most of the time 0 No I take three or more different prescribed or over-the-counter drugs a day 1 Yes Without wanting to, I have lost or gained 10 pounds in the last six months 0 No I am not always physically able to shop, cook and/or feed myself 0 No Nutrition Protocols Good Risk Protocol Moderate Risk Protocol Electronic Signature(s) Signed: 01/15/2018 5:25:23 PM By: Alric Quan Entered By: Alric Quan on 01/15/2018 10:18:11

## 2018-01-22 ENCOUNTER — Encounter: Payer: Medicaid Other | Admitting: Internal Medicine

## 2018-01-22 DIAGNOSIS — L97221 Non-pressure chronic ulcer of left calf limited to breakdown of skin: Secondary | ICD-10-CM | POA: Diagnosis not present

## 2018-01-25 NOTE — Progress Notes (Signed)
[name], [name] ([id number]) Visit Report for 01/22/2018 Arrival Information Details Patient Name: [name], [name]. Date of Service: 01/22/2018 11:30 AM Medical Record Number: [id number] Patient Account Number: [id number] Date of Birth/Sex: 1955/11/03 (62 y.o. F) Treating RN: Secundino Ginger Primary Care Torina Ey: Gabriel Rung Other Clinician: Referring Marlon Suleiman: Gabriel Rung Treating Abdalrahman Clementson/Extender: Tito Dine in Treatment: 1 Visit Information History Since Last Visit Added or deleted any medications: No Patient Arrived: Ambulatory Any new allergies or adverse reactions: No Arrival Time: 11:42 Had a fall or experienced change in No Accompanied By: self activities of daily living that may affect Transfer Assistance: None risk of falls: Patient Identification Verified: Yes Signs or symptoms of abuse/neglect since last visito No Secondary Verification Process Completed: Yes Hospitalized since last visit: No Patient Requires Transmission-Based No Implantable device outside of the clinic excluding No Precautions: cellular tissue based products placed in the center Patient Has Alerts: No since last visit: Has Dressing in Place as Prescribed: Yes Pain Present Now: No Electronic Signature(s) Signed: 01/22/2018 4:26:46 PM By: Secundino Ginger Entered By: Secundino Ginger on 01/22/2018 11:43:15 [name], [name] ([id number]) -------------------------------------------------------------------------------- Clinic Level of Care Assessment Details Patient Name: [name]. Date of Service: 01/22/2018 11:30 AM Medical Record Number: [id number] Patient Account Number: [id number] Date of Birth/Sex: 08/10/1955 (62 y.o. F) Treating RN: Secundino Ginger Primary Care Deiona Hooper: Gabriel Rung Other Clinician: Referring Kearah Gayden: Gabriel Rung Treating Kaidynce Pfister/Extender: Tito Dine in Treatment: 1 Clinic Level of Care Assessment Items TOOL 4 Quantity Score X - Use when only  an EandM is performed on FOLLOW-UP visit 1 0 ASSESSMENTS - Nursing Assessment / Reassessment X - Reassessment of Co-morbidities (includes updates in patient status) 1 10 X- 1 5 Reassessment of Adherence to Treatment Plan ASSESSMENTS - Wound and Skin Assessment / Reassessment X - Simple Wound Assessment / Reassessment - one wound 1 5 []  - 0 Complex Wound Assessment / Reassessment - multiple wounds []  - 0 Dermatologic / Skin Assessment (not related to wound area) ASSESSMENTS - Focused Assessment []  - Circumferential Edema Measurements - multi extremities 0 []  - 0 Nutritional Assessment / Counseling / Intervention []  - 0 Lower Extremity Assessment (monofilament, tuning fork, pulses) []  - 0 Peripheral Arterial Disease Assessment (using hand held doppler) ASSESSMENTS - Ostomy and/or Continence Assessment and Care []  - Incontinence Assessment and Management 0 []  - 0 Ostomy Care Assessment and Management (repouching, etc.) PROCESS - Coordination of Care X - Simple Patient / Family Education for ongoing care 1 15 []  - 0 Complex (extensive) Patient / Family Education for ongoing care []  - 0 Staff obtains Programmer, systems, Records, Test Results / Process Orders []  - 0 Staff telephones HHA, Nursing Homes / Clarify orders / etc []  - 0 Routine Transfer to another Facility (non-emergent condition) []  - 0 Routine Hospital Admission (non-emergent condition) []  - 0 New Admissions / Biomedical engineer / Ordering NPWT, Apligraf, etc. []  - 0 Emergency Hospital Admission (emergent condition) X- 1 10 Simple Discharge Coordination [name], [name] ([id number]) []  - 0 Complex (extensive) Discharge Coordination PROCESS - Special Needs []  - Pediatric / Minor Patient Management 0 []  - 0 Isolation Patient Management []  - 0 Hearing / Language / Visual special needs []  - 0 Assessment of Community assistance (transportation, D/C planning, etc.) []  - 0 Additional assistance / Altered mentation []   - 0 Support Surface(s) Assessment (bed, cushion, seat, etc.) INTERVENTIONS - Wound Cleansing / Measurement X - Simple Wound Cleansing - one wound 1 5 []  - 0 Complex  Wound Cleansing - multiple wounds X- 1 5 Wound Imaging (photographs - any number of wounds) []  - 0 Wound Tracing (instead of photographs) X- 1 5 Simple Wound Measurement - one wound []  - 0 Complex Wound Measurement - multiple wounds INTERVENTIONS - Wound Dressings []  - Small Wound Dressing one or multiple wounds 0 []  - 0 Medium Wound Dressing one or multiple wounds []  - 0 Large Wound Dressing one or multiple wounds []  - 0 Application of Medications - topical []  - 0 Application of Medications - injection INTERVENTIONS - Miscellaneous []  - External ear exam 0 []  - 0 Specimen Collection (cultures, biopsies, blood, body fluids, etc.) []  - 0 Specimen(s) / Culture(s) sent or taken to Lab for analysis []  - 0 Patient Transfer (multiple staff / Civil Service fast streamer / Similar devices) []  - 0 Simple Staple / Suture removal (25 or less) []  - 0 Complex Staple / Suture removal (26 or more) []  - 0 Hypo / Hyperglycemic Management (close monitor of Blood Glucose) []  - 0 Ankle / Brachial Index (ABI) - do not check if billed separately X- 1 5 Vital Signs [name], [name] ([id number]) Has the patient been seen at the hospital within the last three years: Yes Total Score: 65 Level Of Care: New/Established - Level 2 Electronic Signature(s) Signed: 01/22/2018 4:26:46 PM By: Secundino Ginger Entered By: Secundino Ginger on 01/22/2018 12:16:45 [name] ([id number]) -------------------------------------------------------------------------------- Encounter Discharge Information Details Patient Name: [name]. Date of Service: 01/22/2018 11:30 AM Medical Record Number: [id number] Patient Account Number: [id number] Date of Birth/Sex: [birth year] (62 y.o. F) Treating RN: Montey Hora Primary Care Lavelle Berland: Gabriel Rung Other  Clinician: Referring Mila Pair: Gabriel Rung Treating Eliodoro Gullett/Extender: Tito Dine in Treatment: 1 Encounter Discharge Information Items Discharge Condition: Stable Ambulatory Status: Cane Discharge Destination: Home Transportation: Private Auto Accompanied By: self Schedule Follow-up Appointment: Yes Clinical Summary of Care: Electronic Signature(s) Signed: 01/22/2018 1:02:01 PM By: Montey Hora Entered By: Montey Hora on 01/22/2018 13:02:01 [place], [name] ([id number]) -------------------------------------------------------------------------------- Lower Extremity Assessment Details Patient Name: Elizabeth Landau C. Date of Service: 01/22/2018 11:30 AM Medical Record Number: [id number] Patient Account Number: [id number] Date of Birth/Sex: December 26, 1955 (62 y.o. F) Treating RN: Secundino Ginger Primary Care Xaviar Lunn: Gabriel Rung Other Clinician: Referring Manmeet Arzola: Gabriel Rung Treating Ripken Rekowski/Extender: Tito Dine in Treatment: 1 Edema Assessment Assessed: [Left: No] [Right: No] [Left: Edema] [Right: :] Ankle Left: Right: Point of Measurement: 12 cm From Medial Instep 49 cm cm Vascular Assessment Pulses: Dorsalis Pedis Palpable: [Left:Yes] Posterior Tibial Extremity colors, hair growth, and conditions: Extremity Color: [Left:Red] Temperature of Extremity: [Left:Warm] Capillary Refill: [Left:< 3 seconds] Toe Nail Assessment Left: Right: Thick: No Discolored: No Deformed: No Improper Length and Hygiene: No Electronic Signature(s) Signed: 01/22/2018 4:26:46 PM By: Secundino Ginger Entered By: Secundino Ginger on 01/22/2018 11:54:01 [name], [name] ([id number]) -------------------------------------------------------------------------------- Multi Wound Chart Details Patient Name: [name]. Date of Service: 01/22/2018 11:30 AM Medical Record Number: [id number] Patient Account Number: [id number] Date of Birth/Sex: 19-Apr-1956 (62 y.o.  F) Treating RN: Secundino Ginger Primary Care Twylah Bennetts: Gabriel Rung Other Clinician: Referring Altariq Goodall: Gabriel Rung Treating Lessly Stigler/Extender: Tito Dine in Treatment: 1 Vital Signs Height(in): 87 Pulse(bpm): 69 Weight(lbs): 404.9 Blood Pressure(mmHg): 140/81 Body Mass Index(BMI): 72 Temperature(F): 98.3 Respiratory Rate 18 (breaths/min): Photos: [N/A:N/A] Wound Location: Left, Medial Lower Leg N/A N/A Wounding Event: Gradually Appeared N/A N/A Primary Etiology: Lymphedema N/A N/A Comorbid History: Cataracts, Lymphedema, N/A N/A Asthma, Sleep Apnea, Tuberculosis, Hypertension, Gout, Osteoarthritis Date Acquired: 11/23/2017  N/A N/A Weeks of Treatment: 1 N/A N/A Wound Status: Open N/A N/A Measurements L x W x D 2x4.2x0.1 N/A N/A (cm) Area (cm) : 6.597 N/A N/A Volume (cm) : 0.66 N/A N/A % Reduction in Area: 96.40% N/A N/A % Reduction in Volume: 96.40% N/A N/A Classification: Partial Thickness N/A N/A Exudate Amount: Large N/A N/A Exudate Type: Serous N/A N/A Exudate Color: amber N/A N/A Wound Margin: Distinct, outline attached N/A N/A Granulation Amount: Large (67-100%) N/A N/A Granulation Quality: Red N/A N/A Necrotic Amount: Small (1-33%) N/A N/A Exposed Structures: Fascia: No N/A N/A Fat Layer (Subcutaneous Tissue) Exposed: No Tendon: No Muscle: No Joint: No Bone: No [name], [name] ([id number]) Epithelialization: None N/A N/A Periwound Skin Texture: No Abnormalities Noted N/A N/A Periwound Skin Moisture: No Abnormalities Noted N/A N/A Periwound Skin Color: Erythema: Yes N/A N/A Erythema Location: Circumferential N/A N/A Temperature: No Abnormality N/A N/A Tenderness on Palpation: Yes N/A N/A Wound Preparation: Ulcer Cleansing: N/A N/A Rinsed/Irrigated with Saline Topical Anesthetic Applied: Other: lidocaine 4% Treatment Notes Wound #2 (Left, Medial Lower Leg) 1. Cleansed with: Cleanse wound with antibacterial soap and  water 3. Peri-wound Care: Other peri-wound care (specify in notes) 4. Dressing Applied: Calcium Alginate with Silver 5. Secondary Dressing Applied ABD Pad 7. Secured with 3 Layer Compression System - Left Lower Extremity Notes TCA Electronic Signature(s) Signed: 01/24/2018 7:56:50 AM By: Linton Ham MD Entered By: Linton Ham on 01/22/2018 13:04:47 [name], [name] ([id number]) -------------------------------------------------------------------------------- Multi-Disciplinary Care Plan Details Patient Name: JAQUASIA, DOSCHER. Date of Service: 01/22/2018 11:30 AM Medical Record Number: [id number] Patient Account Number: [id number] Date of Birth/Sex: [birth year] (62 y.o. F) Treating RN: Secundino Ginger Primary Care Dary Dilauro: Gabriel Rung Other Clinician: Referring Jalil Lorusso: Gabriel Rung Treating Travious Vanover/Extender: Tito Dine in Treatment: 1 Active Inactive ` Orientation to the Wound Care Program Nursing Diagnoses: Knowledge deficit related to the wound healing center program Goals: Patient/caregiver will verbalize understanding of the [facility] Date Initiated: 01/15/2018 Target Resolution Date: 02/12/2018 Goal Status: Active Interventions: Provide education on orientation to the wound center Notes: ` Wound/Skin Impairment Nursing Diagnoses: Impaired tissue integrity Goals: Patient/caregiver will verbalize understanding of skin care regimen Date Initiated: 01/15/2018 Target Resolution Date: 02/12/2018 Goal Status: Active Ulcer/skin breakdown will have a volume reduction of 30% by week 4 Date Initiated: 01/15/2018 Target Resolution Date: 02/12/2018 Goal Status: Active Interventions: Assess patient/caregiver ability to obtain necessary supplies Assess patient/caregiver ability to perform ulcer/skin care regimen upon admission and as needed Assess ulceration(s) every visit Treatment Activities: Patient referred to home care :  01/15/2018 Skin care regimen initiated : 01/15/2018 Notes: Electronic Signature(s) Signed: 01/22/2018 4:26:46 PM By: Eugenia Mcalpine ([id number]) Entered By: Secundino Ginger on 01/22/2018 12:11:54 [name] ([id number]) -------------------------------------------------------------------------------- Pain Assessment Details Patient Name: [name]. Date of Service: 01/22/2018 11:30 AM Medical Record Number: [id number] Patient Account Number: [id number] Date of Birth/Sex: 08-Aug-1955 (62 y.o. F) Treating RN: Secundino Ginger Primary Care Yesika Rispoli: Gabriel Rung Other Clinician: Referring Semaj [name]: Gabriel Rung Treating Jestina Stephani/Extender: Tito Dine in Treatment: 1 Active Problems Location of Pain Severity and Description of Pain Patient Has Paino No Site Locations Pain Management and Medication Current Pain Management: Goals for Pain Management Topical or injectable lidocaine is offered to patient for acute pain when surgical debridement is performed. If needed, Patient is instructed to use over the counter pain medication for the following 24-48 hours after debridement. Wound care MDs do not prescribed pain medications. Patient has chronic pain or  uncontrolled pain. Patient has been instructed to make an appointment with their Primary Care Physician for pain management. Electronic Signature(s) Signed: 01/22/2018 4:26:46 PM By: Secundino Ginger Entered By: Secundino Ginger on 01/22/2018 11:43:52 [name], [name] ([id number]) -------------------------------------------------------------------------------- Patient/Caregiver Education Details Patient Name: [name] Date of Service: 01/22/2018 11:30 AM Medical Record Number: [id number] Patient Account Number: [id number] Date of Birth/Gender: 10/11/1955 (62 y.o. F) Treating RN: Montey Hora Primary Care Physician: Gabriel Rung Other Clinician: Referring Physician: Gabriel Rung Treating  Physician/Extender: Tito Dine in Treatment: 1 Education Assessment Education Provided To: Patient Education Topics Provided Venous: Handouts: Other: continue using pumps Methods: Explain/Verbal Responses: State content correctly Electronic Signature(s) Signed: 01/22/2018 5:29:35 PM By: Montey Hora Entered By: Montey Hora on 01/22/2018 13:02:23 [name], [name] ([id number]) -------------------------------------------------------------------------------- Wound Assessment Details Patient Name: Elizabeth Landau C. Date of Service: 01/22/2018 11:30 AM Medical Record Number: [id number] Patient Account Number: [id number] Date of Birth/Sex: 10/06/1955 (62 y.o. F) Treating RN: Secundino Ginger Primary Care Jakai Onofre: Gabriel Rung Other Clinician: Referring Yacoub Diltz: Gabriel Rung Treating Jereme Loren/Extender: Tito Dine in Treatment: 1 Wound Status Wound Number: 2 Primary Lymphedema Etiology: Wound Location: Left, Medial Lower Leg Wound Open Wounding Event: Gradually Appeared Status: Date Acquired: 11/23/2017 Comorbid Cataracts, Lymphedema, Asthma, Sleep Weeks Of Treatment: 1 History: Apnea, Tuberculosis, Hypertension, Gout, Clustered Wound: No Osteoarthritis Photos Photo Uploaded By: Secundino Ginger on 01/22/2018 11:56:40 Wound Measurements Length: (cm) 2 Width: (cm) 4.2 Depth: (cm) 0.1 Area: (cm) 6.597 Volume: (cm) 0.66 % Reduction in Area: 96.4% % Reduction in Volume: 96.4% Epithelialization: None Tunneling: No Undermining: No Wound Description Classification: Partial Thickness Wound Margin: Distinct, outline attached Exudate Amount: Large Exudate Type: Serous Exudate Color: amber Foul Odor After Cleansing: No Slough/Fibrino Yes Wound Bed Granulation Amount: Large (67-100%) Exposed Structure Granulation Quality: Red Fascia Exposed: No Necrotic Amount: Small (1-33%) Fat Layer (Subcutaneous Tissue) Exposed: No Necrotic Quality: Adherent  Slough Tendon Exposed: No Muscle Exposed: No Joint Exposed: No Bone Exposed: No Periwound Skin Texture Texture Color No Abnormalities Noted: No No Abnormalities Noted: No [name], [name] ([id number]) Moisture Erythema: Yes No Abnormalities Noted: No Erythema Location: Circumferential Temperature / Pain Temperature: No Abnormality Tenderness on Palpation: Yes Wound Preparation Ulcer Cleansing: Rinsed/Irrigated with Saline Topical Anesthetic Applied: Other: lidocaine 4%, Treatment Notes Wound #2 (Left, Medial Lower Leg) 1. Cleansed with: Cleanse wound with antibacterial soap and water 3. Peri-wound Care: Other peri-wound care (specify in notes) 4. Dressing Applied: Calcium Alginate with Silver 5. Secondary Dressing Applied ABD Pad 7. Secured with 3 Layer Compression System - Left Lower Extremity Notes TCA Electronic Signature(s) Signed: 01/22/2018 4:26:46 PM By: Secundino Ginger Entered By: Secundino Ginger on 01/22/2018 12:11:46 [name], [name] ([id number]) -------------------------------------------------------------------------------- Vitals Details Patient Name: [name]. Date of Service: 01/22/2018 11:30 AM Medical Record Number: [id number] Patient Account Number: [id number] Date of Birth/Sex: May 03, 1956 (62 y.o. F) Treating RN: Secundino Ginger Primary Care Tonia Avino: Gabriel Rung Other Clinician: Referring Danea Manter: Gabriel Rung Treating Sair Faulcon/Extender: Tito Dine in Treatment: 1 Vital Signs Time Taken: 11:40 Temperature (F): 98.3 Height (in): 63 Pulse (bpm): 81 Weight (lbs): 404.9 Respiratory Rate (breaths/min): 18 Body Mass Index (BMI): 71.7 Blood Pressure (mmHg): 140/81 Reference Range: 80 - 120 mg / dl Electronic Signature(s) Signed: 01/22/2018 4:26:46 PM By: Secundino Ginger Entered By: Secundino Ginger on 01/22/2018 11:44:21

## 2018-01-25 NOTE — Progress Notes (Signed)
[name], [name] ([id number]) Visit Report for 01/22/2018 HPI Details Patient Name: [name], [name]. Date of Service: 01/22/2018 11:30 AM Medical Record Number: [id number] Patient Account Number: [id number] Date of Birth/Sex: 01-06-1956 (62 y.o. F) Treating RN: Montey Hora Primary Care Provider: Gabriel Rung Other Clinician: Referring Provider: Gabriel Rung Treating Provider/Extender: Tito Dine in Treatment: 1 History of Present Illness Location: right lower extremity Quality: Patient reports experiencing a dull pain to affected area(s). Severity: Patient states wound are getting worse. Duration: Patient has had the wound for < 2 weeks prior to presenting for treatment Timing: Pain in wound is Intermittent (comes and goes Context: The wound appeared gradually over time Modifying Factors: Consults to this date include:seen by her PCP and recently put on clindamycin Associated Signs and Symptoms: Patient reports having difficulty standing for long periods. HPI Description: The patient is a 62 year old female with history of hypertension and a long-standing history of bilateral lower extremity lymphedema (first presented on 4/2) . She has had open ulcers in the past which have always responded to compression therapy. She had briefly been to a lymphedema clinic in the past which helped her at the time. this time around she stopped treatment of her lymphedema pumps approximately 2 weeks ago because of some pain in the knees and then noticed the right leg getting worse. She was seen by her PCP who put her on clindamycin 4 times a day 2 days ago. The patient has seen [facility] and Dr. Delana Meyer had seen her last year where a vascular study including venous and arterial duplex studies were within normal limits. he had recommended compression stockings and lymphedema pumps and the patient has been using this in about 2 weeks ago. She is known to be diabetic but in the past few  time she's gone to her primary care doctor her hemoglobin A1c has been normal. 02/11/2015 - after her last visit she took my advice and went to the ER regarding the progressive cellulitis of her right lower extremity and she was admitted between July 17 and 22nd. She received IV antibiotics and then was sent home on a course of steroid-induced and oral antibiotics. She has improved much since then. 02/17/2015 -- she has been doing fine and the weeping of her legs has remarkably gone down. She has no fresh issues. READMISSION 01/15/18 This patient was given this clinic before most recently in 2016 seen by Dr. Con Memos. She has massive bilateral lymphedema and over the last 2 months this had weeping edema out of the left leg. She has compression pumps but her compliance with these has been minimal. She has [facility] they've been using TCA/ABDs/kerlix under an Ace wrap.she has had recent problems with cellulitis. She was apparently seen in the ER and 12/23/17 and given clindamycin. She was then followed by her primary doctor and given doxycycline and Keflex. The pain seems to have settled down. In April 2018 the patient had arterial studies done at [facility]. This showed triphasic waveforms throughout the right leg and mostly triphasic waveforms on the left except for monophasic at the posterior tibial artery distally. She was not felt to have evidence of right lower extremity arterial stenosis or significant problems on the left side. 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 She also had a right lower extremity venous Doppler in January 2018 this was limited by the patient's body habitus and lymphedema. Most of the proximal veins were not visualized The patient presents  with an area of denuded skin on the anterior medial part of the left calf. There is weeping edema fluid here. [name], [name] ([id number]) 01/22/18; the patient has somewhat better  edema control using her compression pumps twice a day and as a result she has much better epithelialization on the left anterior calf area. Only a small open area remains. Electronic Signature(s) Signed: 01/24/2018 7:56:50 AM By: Linton Ham MD Entered By: Linton Ham on 01/22/2018 13:05:41 [place], [name] ([id number]) -------------------------------------------------------------------------------- Physical Exam Details Patient Name: [name], [name] Date of Service: 01/22/2018 11:30 AM Medical Record Number: [id number] Patient Account Number: [id number] Date of Birth/Sex: 1955/12/31 (62 y.o. F) Treating RN: Montey Hora Primary Care Provider: Gabriel Rung Other Clinician: Referring Provider: Gabriel Rung Treating Provider/Extender: Tito Dine in Treatment: 1 Constitutional Sitting or standing Blood Pressure is within target range for patient.. Pulse regular and within target range for patient.Marland Kitchen Respirations regular, non-labored and within target range.. Temperature is normal and within the target range for the patient.Marland Kitchen appears in no distress. Respiratory Respiratory effort is easy and symmetric bilaterally. Rate is normal at rest and on room air.. Cardiovascular dorsalis pedis pulses are still palpable even through the edema. massive bilateral lymphedema. She has secondary tonic inflammation in the left leg. Integumentary (Hair, Skin) massive bilateral lymphedema with advanced skin damage including cobblestoning, nodules, skin tags and protrusions. Notes wound exam; the problematic areas in the left anterior lower leg. There is only a small area that is not epithelialized now. Some weeping edema fluid but much better. No evidence of surrounding cellulitis. The patient is complaining of pain in her left anterior thigh. I can see no evidence of cellulitis there was some palpable tenderness but I could feel no fluid collection. Electronic  Signature(s) Signed: 01/24/2018 7:56:50 AM By: Linton Ham MD Entered By: Linton Ham on 01/22/2018 [name], [name] ([id number]) -------------------------------------------------------------------------------- Physician Orders Details Patient Name: [name] Date of Service: 01/22/2018 11:30 AM Medical Record Number: [id number] Patient Account Number: [id number] Date of Birth/Sex: Nov 10, 1955 (62 y.o. F) Treating RN: Secundino Ginger Primary Care Provider: Gabriel Rung Other Clinician: Referring Provider: Gabriel Rung Treating Provider/Extender: Tito Dine in Treatment: 1 Verbal / Phone Orders: No Diagnosis Coding Wound Cleansing Wound #2 Left,Medial Lower Leg o Clean wound with Normal Saline. Skin Barriers/Peri-Wound Care o Triamcinolone Acetonide Ointment (TCA) Primary Wound Dressing Wound #2 Left,Medial Lower Leg o Silver Alginate - place on red area on left lower leg Secondary Dressing Wound #2 Left,Medial Lower Leg o ABD pad o Conform/Kerlix - wrap around foot to mid lower leg where the larger part of the leg begins Dressing Change Frequency Wound #2 Left,Medial Lower Leg o Change Dressing Monday, Wednesday, Friday Follow-up Appointments Wound #2 Left,Medial Lower Leg o Return Appointment in 1 week. Edema Control o 3 Layer Compression System - Left Lower Extremity - wrap 3 layer from foot up to the mid lower leg where the larger part of the leg begins o Compression Pump: Use compression pump on left lower extremity for 30 minutes, twice daily. - use one hour twice daily o Compression Pump: Use compression pump on right lower extremity for 30 minutes, twice daily. - use one hour twice daily Home Health Wound #2 [place] Nurse may visit PRN to address patientos wound care needs. o FACE TO FACE ENCOUNTER: MEDICARE and MEDICAID PATIENTS: I certify that  this patient is under my care and that I  had a face-to-face encounter that meets the physician face-to-face encounter requirements with this patient on this date. The encounter with the patient was in whole or in part for the following MEDICAL CONDITION: (primary reason for [place]) MEDICAL NECESSITY: I certify, that based on my findings, NURSING services are a medically necessary home health service. HOME BOUND STATUS: I certify that my clinical findings support that this patient is homebound (i.e., Due to illness or injury, pt requires aid of supportive devices such as crutches, cane, wheelchairs, walkers, the use of special transportation or the assistance of another person to leave their place of residence. There is a normal inability to leave the home [place], Mackey. ([id number]) and doing so requires considerable and taxing effort. Other absences are for medical reasons / religious services and are infrequent or of short duration when for other reasons). o If current dressing causes regression in wound condition, may D/C ordered dressing product/s and apply Normal Saline Moist Dressing daily until next [place] / Other MD appointment. [place] of regression in wound condition at [phone]. o Please direct any NON-WOUND related issues/requests for orders to patient's Primary Care Physician Electronic Signature(s) Signed: 01/22/2018 4:26:46 PM By: Secundino Ginger Signed: 01/24/2018 7:56:50 AM By: Linton Ham MD Entered By: Secundino Ginger on 01/22/2018 12:14:14 [name], [name] ([id number]) -------------------------------------------------------------------------------- Problem List Details Patient Name: [name], [name]. Date of Service: 01/22/2018 11:30 AM Medical Record Number: [id number] Patient Account Number: [id number] Date of Birth/Sex: 23-Jun-1956 (62 y.o. 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 #1 TCA #2 silver alginate #3 for liver Medical compression #4 Decision external Making : compression pumps twice a day I89.0 Lymphedema, not elsewhere classified 01/15/2018 Yes Yes Status Complications Interventions Improving much better edema control she is compliant with Medical her compression Decision pumps at home Making : twice a day at our suggestion Inactive Problems Resolved Problems Electronic Signature(s) Signed: 01/24/2018 7:56:50 AM By: Linton Ham MD Entered By: Linton Ham on 01/22/2018 13:02:34 [name], [name] ([id number]) -------------------------------------------------------------------------------- Progress Note Details Patient Name: [name]. Date of Service: 01/22/2018 11:30 AM Medical Record Number: [id number] Patient Account Number: [id number] Date of Birth/Sex: 1956-04-28 (62 y.o. F) Treating RN: Montey Hora Primary Care Provider: Gabriel Rung Other Clinician: Referring Provider: Gabriel Rung Treating Provider/Extender: Tito Dine in Treatment: 1 Subjective History of Present Illness (HPI) The following HPI elements were documented for the patient's wound: Location: right lower extremity Quality: Patient reports experiencing a dull pain to affected area(s). Severity: Patient states wound are getting worse. Duration: Patient has had the wound for < 2 weeks prior to presenting for treatment Timing: Pain in wound is Intermittent (comes and goes Context: The wound appeared gradually over time Modifying Factors:  Consults to this date include:seen by her PCP and recently put on clindamycin Associated Signs and Symptoms: Patient reports having difficulty standing for long periods. The patient is a 62 year old female with history of hypertension and a long-standing history of bilateral lower extremity lymphedema (first presented on 4/2) . She has had open ulcers in the past which have always responded to compression therapy. She had briefly been to a lymphedema clinic  in the past which helped her at the time. this time around she stopped treatment of her lymphedema pumps approximately 2 weeks ago because of some pain in the knees and then noticed the right leg getting worse. She was seen by her PCP who put her on clindamycin 4 times a day 2 days ago. The patient has seen [facility] and Dr. Delana Meyer had seen her last year where a vascular study including venous and arterial duplex studies were within normal limits. he had recommended compression stockings and lymphedema pumps and the patient has been using this in about 2 weeks ago. She is known to be diabetic but in the past few time she's gone to her primary care doctor her hemoglobin A1c has been normal. 02/11/2015 - after her last visit she took my advice and went to the ER regarding the progressive cellulitis of her right lower extremity and she was admitted between July 17 and 22nd. She received IV antibiotics and then was sent home on a course of steroid-induced and oral antibiotics. She has improved much since then. 02/17/2015 -- she has been doing fine and the weeping of her legs has remarkably gone down. She has no fresh issues. READMISSION 01/15/18 This patient was given this clinic before most recently in 2016 seen by Dr. Con Memos. She has massive bilateral lymphedema and over the last 2 months this had weeping edema out of the left leg. She has compression pumps but her compliance with these has been minimal. She has [facility] they've been using  TCA/ABDs/kerlix under an Ace wrap.she has had recent problems with cellulitis. She was apparently seen in the ER and 12/23/17 and given clindamycin. She was then followed by her primary doctor and given doxycycline and Keflex. The pain seems to have settled down. In April 2018 the patient had arterial studies done at [facility]. This showed triphasic waveforms throughout the right leg and mostly triphasic waveforms on the left except for monophasic at the posterior tibial artery distally. She was not felt to have evidence of right lower extremity arterial stenosis or significant problems on the left side. 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 She also had a right lower extremity venous Doppler in January 2018 this was limited by the patient's body habitus and lymphedema. Most of the proximal veins were not visualized The patient presents with an area of denuded skin on the anterior medial part of the left calf. There is weeping edema fluid here. [name], [name] ([id number]) 01/22/18; the patient has somewhat better edema control using her compression pumps twice a day and as a result she has much better epithelialization on the left anterior calf area. Only a small open area remains. Objective Constitutional Sitting or standing Blood Pressure is within target range for patient.. Pulse regular and within target range for patient.Marland Kitchen Respirations regular, non-labored and within target range.. Temperature is normal and within the target range for the patient.Marland Kitchen appears in no distress. Vitals Time Taken: 11:40 AM, Height: 63 in, Weight: 404.9 lbs, BMI: 71.7, Temperature: 98.3 F, Pulse: 81 bpm, Respiratory Rate: 18 breaths/min, Blood Pressure: 140/81 mmHg. Respiratory Respiratory effort is easy and symmetric bilaterally. Rate is normal at rest and on room air.. Cardiovascular dorsalis pedis pulses are still palpable even through the edema. massive  bilateral lymphedema. She has secondary tonic inflammation in the left leg. General Notes: wound exam; the problematic areas in the left anterior lower leg. There is only a small area that  is not epithelialized now. Some weeping edema fluid but much better. No evidence of surrounding cellulitis. The patient is complaining of pain in her left anterior thigh. I can see no evidence of cellulitis there was some palpable tenderness but I could feel no fluid collection. Integumentary (Hair, Skin) massive bilateral lymphedema with advanced skin damage including cobblestoning, nodules, skin tags and protrusions. Wound #2 status is Open. Original cause of wound was Gradually Appeared. The wound is located on the Left,Medial Lower Leg. The wound measures 2cm length x 4.2cm width x 0.1cm depth; 6.597cm^2 area and 0.66cm^3 volume. There is no tunneling or undermining noted. There is a large amount of serous drainage noted. The wound margin is distinct with the outline attached to the wound base. There is large (67-100%) red granulation within the wound bed. There is a small (1-33%) amount of necrotic tissue within the wound bed including Adherent Slough. The periwound skin appearance exhibited: Erythema. The surrounding wound skin color is noted with erythema which is circumferential. Periwound temperature was noted as No Abnormality. The periwound has tenderness on palpation. Assessment Active Problems ICD-10 Non-pressure chronic ulcer of left calf limited to breakdown of skin Lymphedema, not elsewhere classified [name], [name] ([id number]) Plan Wound Cleansing: Wound #2 Left,Medial Lower Leg: Clean wound with Normal Saline. Skin Barriers/Peri-Wound Care: Triamcinolone Acetonide Ointment (TCA) Primary Wound Dressing: Wound #2 Left,Medial Lower Leg: Silver Alginate - place on red area on left lower leg Secondary Dressing: Wound #2 Left,Medial Lower Leg: ABD pad Conform/Kerlix - wrap around  foot to mid lower leg where the larger part of the leg begins Dressing Change Frequency: Wound #2 Left,Medial Lower Leg: Change Dressing Monday, Wednesday, Friday Follow-up Appointments: Wound #2 Left,Medial Lower Leg: Return Appointment in 1 week. Edema Control: 3 Layer Compression System - Left Lower Extremity - wrap 3 layer from foot up to the mid lower leg where the larger part of the leg begins Compression Pump: Use compression pump on left lower extremity for 30 minutes, twice daily. - use one hour twice daily Compression Pump: Use compression pump on right lower extremity for 30 minutes, twice daily. - use one hour twice daily Home Health: Wound #2 Left,Medial Lower Leg: [place] Nurse may visit PRN to address patient s wound care needs. FACE TO FACE ENCOUNTER: MEDICARE and MEDICAID PATIENTS: I certify that this patient is under my care and that I had a face-to-face encounter that meets the physician face-to-face encounter requirements with this patient on this date. The encounter with the patient was in whole or in part for the following MEDICAL CONDITION: (primary reason for [place]) MEDICAL NECESSITY: I certify, that based on my findings, NURSING services are a medically necessary home health service. HOME BOUND STATUS: I certify that my clinical findings support that this patient is homebound (i.e., Due to illness or injury, pt requires aid of supportive devices such as crutches, cane, wheelchairs, walkers, the use of special transportation or the assistance of another person to leave their place of residence. There is a normal inability to leave the home and doing so requires considerable and taxing effort. Other absences are for medical reasons / religious services and are infrequent or of short duration when for other reasons). If current dressing causes regression in wound condition, may D/C ordered dressing product/s and apply Normal  Saline Moist Dressing daily until next [place] / Other MD appointment. [place] of regression in wound condition at [phone]. Please direct  any NON-WOUND related issues/requests for orders to patient's Primary Care Physician Medical Decision Making Non-pressure chronic ulcer of left calf limited to breakdown of skin 01/15/2018 Status: Improving Complications: patient with massive lymphedema of her bilateral legs. Interventions: #1 TCA #2 silver alginate #3 for liver compression #4 external compression pumps twice a day Lymphedema, not elsewhere classified 01/15/2018 Status: Improving Complications: much better edema control Interventions: she is compliant with her compression pumps at home twice a day at our suggestion Mcfarren, Anavi C. ([id number]) #1 much improved. Most of the large anterior tibial area is epithelialized. Still small open area remains. It is continue she is likely to be able to be discharged next week #2 the patient does not have an arterial issue. I told her that the key to maintaining skin integrity is to use her compression pumps twice a day, she ultimately may need to use it more often than that #3 the patient complained of anterior thigh pain. I could not really determine an etiology of this at the bedside. There was certainly no cellulitis no overt evidence of a DVT no erythema. Electronic Signature(s) Signed: 01/24/2018 7:56:50 AM By: Linton Ham MD Entered By: Linton Ham on 01/22/2018 [place], [name] ([id number]) -------------------------------------------------------------------------------- SuperBill Details Patient Name: [name] Date of Service: 01/22/2018 Medical Record Number: [id number] Patient Account Number: [id number] Date of Birth/Sex: 12-Jul-1955 (62 y.o. 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

## 2018-01-29 ENCOUNTER — Encounter: Payer: Medicaid Other | Admitting: Internal Medicine

## 2018-01-29 DIAGNOSIS — L97221 Non-pressure chronic ulcer of left calf limited to breakdown of skin: Secondary | ICD-10-CM | POA: Diagnosis not present

## 2018-02-07 NOTE — Progress Notes (Addendum)
Elizabeth, [name] ([id number]) Visit Report for 01/29/2018 Arrival Information Details Patient Name: [name], [name]. Date of Service: 01/29/2018 10:30 AM Medical Record Number: [id number] Patient Account Number: [id number] Date of Birth/Sex: 1956-04-11 (62 y.o. F) Treating RN: Cornell Barman Primary Care Renita Brocks: Gabriel Rung Other Clinician: Referring Abubakr Wieman: Gabriel Rung Treating Camela Wich/Extender: Tito Dine in Treatment: 2 Visit Information History Since Last Visit Added or deleted any medications: No Patient Arrived: Ambulatory Any new allergies or adverse reactions: No Arrival Time: 10:49 Had a fall or experienced change in No Accompanied By: self activities of daily living that may affect Transfer Assistance: None risk of falls: Patient Identification Verified: Yes Signs or symptoms of abuse/neglect since last visito No Secondary Verification Process Completed: Yes Hospitalized since last visit: No Patient Requires Transmission-Based No Implantable device outside of the clinic excluding No Precautions: cellular tissue based products placed in the center Patient Has Alerts: No since last visit: Pain Present Now: No Electronic Signature(s) Signed: 02/03/2018 4:23:14 PM By: Gretta Cool, BSN, RN, CWS, Kim RN, BSN Entered By: Gretta Cool, BSN, RN, CWS, Kim on 02/03/2018 16:23:13 [name] ([id number]) -------------------------------------------------------------------------------- Clinic Level of Care Assessment Details Patient Name: [name]. Date of Service: 01/29/2018 10:30 AM Medical Record Number: [id number] Patient Account Number: [id number] Date of Birth/Sex: 1956-03-23 (62 y.o. F) Treating RN: Cornell Barman Primary Care Madelynn Malson: Gabriel Rung Other Clinician: Referring Alondra Sahni: Gabriel Rung Treating Demontrez Rindfleisch/Extender: Tito Dine in Treatment: 2 Clinic Level of Care Assessment Items TOOL 4 Quantity Score []  - Use when only  an EandM is performed on FOLLOW-UP visit 0 ASSESSMENTS - Nursing Assessment / Reassessment []  - Reassessment of Co-morbidities (includes updates in patient status) 0 X- 1 5 Reassessment of Adherence to Treatment Plan ASSESSMENTS - Wound and Skin Assessment / Reassessment X - Simple Wound Assessment / Reassessment - one wound 1 5 []  - 0 Complex Wound Assessment / Reassessment - multiple wounds []  - 0 Dermatologic / Skin Assessment (not related to wound area) ASSESSMENTS - Focused Assessment []  - Circumferential Edema Measurements - multi extremities 0 []  - 0 Nutritional Assessment / Counseling / Intervention []  - 0 Lower Extremity Assessment (monofilament, tuning fork, pulses) []  - 0 Peripheral Arterial Disease Assessment (using hand held doppler) ASSESSMENTS - Ostomy and/or Continence Assessment and Care []  - Incontinence Assessment and Management 0 []  - 0 Ostomy Care Assessment and Management (repouching, etc.) PROCESS - Coordination of Care X - Simple Patient / Family Education for ongoing care 1 15 []  - 0 Complex (extensive) Patient / Family Education for ongoing care []  - 0 Staff obtains Programmer, systems, Records, Test Results / Process Orders []  - 0 Staff telephones HHA, Nursing Homes / Clarify orders / etc []  - 0 Routine Transfer to another Facility (non-emergent condition) []  - 0 Routine Hospital Admission (non-emergent condition) []  - 0 New Admissions / Biomedical engineer / Ordering NPWT, Apligraf, etc. []  - 0 Emergency Hospital Admission (emergent condition) X- 1 10 Simple Discharge Coordination KAMARIAH, FRUCHTER. ([id number]) []  - 0 Complex (extensive) Discharge Coordination PROCESS - Special Needs []  - Pediatric / Minor Patient Management 0 []  - 0 Isolation Patient Management []  - 0 Hearing / Language / Visual special needs []  - 0 Assessment of Community assistance (transportation, D/C planning, etc.) []  - 0 Additional assistance / Altered mentation []  -  0 Support Surface(s) Assessment (bed, cushion, seat, etc.) INTERVENTIONS - Wound Cleansing / Measurement X - Simple Wound Cleansing - one wound 1 5 []  - 0 Complex Wound  Cleansing - multiple wounds X- 1 5 Wound Imaging (photographs - any number of wounds) []  - 0 Wound Tracing (instead of photographs) X- 1 5 Simple Wound Measurement - one wound []  - 0 Complex Wound Measurement - multiple wounds INTERVENTIONS - Wound Dressings []  - Small Wound Dressing one or multiple wounds 0 []  - 0 Medium Wound Dressing one or multiple wounds []  - 0 Large Wound Dressing one or multiple wounds []  - 0 Application of Medications - topical []  - 0 Application of Medications - injection INTERVENTIONS - Miscellaneous []  - External ear exam 0 []  - 0 Specimen Collection (cultures, biopsies, blood, body fluids, etc.) []  - 0 Specimen(s) / Culture(s) sent or taken to Lab for analysis []  - 0 Patient Transfer (multiple staff / Civil Service fast streamer / Similar devices) []  - 0 Simple Staple / Suture removal (25 or less) []  - 0 Complex Staple / Suture removal (26 or more) []  - 0 Hypo / Hyperglycemic Management (close monitor of Blood Glucose) []  - 0 Ankle / Brachial Index (ABI) - do not check if billed separately X- 1 5 Vital Signs Whisman, Javeria C. ([id number]) Has the patient been seen at the hospital within the last three years: Yes Total Score: 55 Level Of Care: New/Established - Level 2 Electronic Signature(s) Signed: 02/07/2018 6:17:48 PM By: Gretta Cool, BSN, RN, CWS, Kim RN, BSN Entered By: Gretta Cool, BSN, RN, CWS, Kim on 02/03/2018 16:27:33 Pendergraph, [name] ([id number]) -------------------------------------------------------------------------------- Encounter Discharge Information Details Patient Name: Elizabeth Landau C. Date of Service: 01/29/2018 10:30 AM Medical Record Number: [id number] Patient Account Number: [id number] Date of Birth/Sex: 05-14-1956 (62 y.o. F) Treating RN: Cornell Barman Primary Care Jilliam Bellmore:  Gabriel Rung Other Clinician: Referring Maycol Hoying: Gabriel Rung Treating Marquisa Salih/Extender: Tito Dine in Treatment: 2 Encounter Discharge Information Items Discharge Condition: Stable Ambulatory Status: Ambulatory Discharge Destination: Home Transportation: Private Auto Accompanied By: self Schedule Follow-up Appointment: Yes Clinical Summary of Care: Electronic Signature(s) Signed: 02/03/2018 4:28:22 PM By: Gretta Cool, BSN, RN, CWS, Kim RN, BSN Entered By: Gretta Cool, BSN, RN, CWS, Kim on 02/03/2018 81:82:99 [name] ([id number]) -------------------------------------------------------------------------------- Multi Wound Chart Details Patient Name: [name]. Date of Service: 01/29/2018 10:30 AM Medical Record Number: [id number] Patient Account Number: [id number] Date of Birth/Sex: 1955-09-20 (62 y.o. F) Treating RN: Cornell Barman Primary Care Celine Dishman: Gabriel Rung Other Clinician: Referring Mayan Kloepfer: Gabriel Rung Treating Graceanna Theissen/Extender: Tito Dine in Treatment: 2 Vital Signs Height(in): 63 Pulse(bpm): 90 Weight(lbs): 404.9 Blood Pressure(mmHg): 133/80 Body Mass Index(BMI): 72 Temperature(F): 98.2 Respiratory Rate 18 (breaths/min): Photos: [2:No Photos] [N/A:N/A] Wound Location: [2:Left Lower Leg - Medial] [N/A:N/A] Wounding Event: [2:Gradually Appeared] [N/A:N/A] Primary Etiology: [2:Lymphedema] [N/A:N/A] Comorbid History: [2:Cataracts, Lymphedema, Asthma, Sleep Apnea, Tuberculosis, Hypertension, Gout, Osteoarthritis] [N/A:N/A] Date Acquired: [2:11/23/2017] [N/A:N/A] Weeks of Treatment: [2:2] [N/A:N/A] Wound Status: [2:Healed - Epithelialized] [N/A:N/A] Measurements L x W x D [2:0x0x0] [N/A:N/A] (cm) Area (cm) : [2:0] [N/A:N/A] Volume (cm) : [2:0] [N/A:N/A] % Reduction in Area: [2:100.00%] [N/A:N/A] % Reduction in Volume: [2:100.00%] [N/A:N/A] Classification: [2:Partial Thickness] [N/A:N/A] Exudate Amount: [2:None  Present] [N/A:N/A] Wound Margin: [2:Flat and Intact] [N/A:N/A] Granulation Amount: [2:None Present (0%)] [N/A:N/A] Necrotic Amount: [2:None Present (0%)] [N/A:N/A] Exposed Structures: [2:Fascia: No Fat Layer (Subcutaneous Tissue) Exposed: No Tendon: No Muscle: No Joint: No Bone: No] [N/A:N/A] Epithelialization: [2:Large (67-100%)] [N/A:N/A] Periwound Skin Texture: [2:No Abnormalities Noted] [N/A:N/A] Periwound Skin Moisture: [2:No Abnormalities Noted] [N/A:N/A] Periwound Skin Color: [2:No Abnormalities Noted No] [N/A:N/A N/A] Treatment Notes DANAYE, SOBH ([id number]) Electronic Signature(s) Signed: 02/05/2018 8:05:08 AM By:  Linton Ham MD Previous Signature: 02/03/2018 4:25:59 PM Version By: Gretta Cool BSN, RN, CWS, Kim RN, BSN Entered By: Linton Ham on 02/04/2018 05:13:29 [name] ([id number]) -------------------------------------------------------------------------------- Multi-Disciplinary Care Plan Details Patient Name: LIALA, CODISPOTI. Date of Service: 01/29/2018 10:30 AM Medical Record Number: [id number] Patient Account Number: [id number] Date of Birth/Sex: 07-23-1955 (62 y.o. F) Treating RN: Cornell Barman Primary Care Hanz Winterhalter: Gabriel Rung Other Clinician: Referring Izyk Marty: Gabriel Rung Treating Kourtlynn Trevor/Extender: Tito Dine in Treatment: 2 Active Inactive Electronic Signature(s) Signed: 02/03/2018 4:25:47 PM By: Gretta Cool, BSN, RN, CWS, Kim RN, BSN Entered By: Gretta Cool, BSN, RN, CWS, Kim on 02/03/2018 16:25:46 [name] ([id number]) -------------------------------------------------------------------------------- Pain Assessment Details Patient Name: JASZMINE, NAVEJAS. Date of Service: 01/29/2018 10:30 AM Medical Record Number: [id number] Patient Account Number: [id number] Date of Birth/Sex: 12-Jul-1955 (62 y.o. F) Treating RN: Cornell Barman Primary Care Shinita Mac: Gabriel Rung Other Clinician: Referring Mana Morison: Gabriel Rung Treating  Panhia Karl/Extender: Tito Dine in Treatment: 2 Active Problems Location of Pain Severity and Description of Pain Patient Has Paino No Site Locations Pain Management and Medication Current Pain Management: Electronic Signature(s) Signed: 02/03/2018 4:23:21 PM By: Gretta Cool, BSN, RN, CWS, Kim RN, BSN Entered By: Gretta Cool, BSN, RN, CWS, Kim on 02/03/2018 16:23:20 [name] ([id number]) -------------------------------------------------------------------------------- Patient/Caregiver Education Details Patient Name: [name] Date of Service: 01/29/2018 10:30 AM Medical Record Number: [id number] Patient Account Number: [id number] Date of Birth/Gender: 1956-02-19 (62 y.o. F) Treating RN: Cornell Barman Primary Care Physician: Gabriel Rung Other Clinician: Referring Physician: Gabriel Rung Treating Physician/Extender: Tito Dine in Treatment: 2 Education Assessment Education Provided To: Patient Education Topics Provided Venous: Controlling Swelling with Compression Stockings , Other: Use Compression pumps twice daily for 1 hour at a Handouts: time Methods: Explain/Verbal Responses: State content correctly Electronic Signature(s) Signed: 02/07/2018 6:17:48 PM By: Gretta Cool, BSN, RN, CWS, Kim RN, BSN Entered By: Gretta Cool, BSN, RN, CWS, Kim on 02/03/2018 16:29:13 [name] ([id number]) -------------------------------------------------------------------------------- Wound Assessment Details Patient Name: HENRITTA, MUTZ C. Date of Service: 01/29/2018 10:30 AM Medical Record Number: [id number] Patient Account Number: [id number] Date of Birth/Sex: 01-18-1956 (62 y.o. F) Treating RN: Cornell Barman Primary Care Roopa Graver: Gabriel Rung Other Clinician: Referring Brandilee Pies: Gabriel Rung Treating Madaline Lefeber/Extender: Tito Dine in Treatment: 2 Wound Status Wound Number: 2 Primary Lymphedema Etiology: Wound Location: Left Lower Leg -  Medial Wound Healed - Epithelialized Wounding Event: Gradually Appeared Status: Date Acquired: 11/23/2017 Comorbid Cataracts, Lymphedema, Asthma, Sleep Weeks Of Treatment: 2 History: Apnea, Tuberculosis, Hypertension, Gout, Clustered Wound: No Osteoarthritis Photos Photo Uploaded By: Sharon Mt on 02/04/2018 13:18:07 Wound Measurements Length: (cm) 0 Width: (cm) 0 Depth: (cm) 0 Area: (cm) 0 Volume: (cm) 0 % Reduction in Area: 100% % Reduction in Volume: 100% Epithelialization: Large (67-100%) Wound Description Classification: Partial Thickness Wound Margin: Flat and Intact Exudate Amount: None Present Foul Odor After Cleansing: No Slough/Fibrino No Wound Bed Granulation Amount: None Present (0%) Exposed Structure Necrotic Amount: None Present (0%) Fascia Exposed: No Fat Layer (Subcutaneous Tissue) Exposed: No Tendon Exposed: No Muscle Exposed: No Joint Exposed: No Bone Exposed: No Periwound Skin Texture Texture Color No Abnormalities Noted: No No Abnormalities Noted: No JAVONDA, SUH ([id number]) Moisture No Abnormalities Noted: No Electronic Signature(s) Signed: 02/03/2018 4:24:16 PM By: Gretta Cool, BSN, RN, CWS, Kim RN, BSN Entered By: Gretta Cool, BSN, RN, CWS, Kim on 02/03/2018 16:24:15 Larrick, [name] ([id number]) -------------------------------------------------------------------------------- Vitals Details Patient Name: [name]. Date of Service: 01/29/2018 10:30 AM Medical Record Number: [id number] Patient  Account Number: [id number] Date of Birth/Sex: 07-03-1956 (62 y.o. F) Treating RN: Cornell Barman Primary Care Jamia Hoban: Gabriel Rung Other Clinician: Referring Jerauld Bostwick: Gabriel Rung Treating Duncan Alejandro/Extender: Tito Dine in Treatment: 2 Vital Signs Time Taken: 10:49 Temperature (F): 98.2 Height (in): 63 Pulse (bpm): 90 Weight (lbs): 404.9 Respiratory Rate (breaths/min): 18 Body Mass Index (BMI): 71.7 Blood Pressure  (mmHg): 133/80 Reference Range: 80 - 120 mg / dl Electronic Signature(s) Signed: 02/03/2018 4:23:42 PM By: Gretta Cool, BSN, RN, CWS, Kim RN, BSN Entered By: Gretta Cool, BSN, RN, CWS, Kim on 02/03/2018 16:23:42

## 2018-03-11 ENCOUNTER — Encounter: Payer: Self-pay | Admitting: *Deleted

## 2018-03-12 ENCOUNTER — Other Ambulatory Visit: Payer: Self-pay

## 2018-03-12 ENCOUNTER — Encounter
Admission: RE | Disposition: A | Discharge status: Home / Self Care | Payer: Self-pay | Source: Ambulatory Visit | Attending: Unknown Physician Specialty

## 2018-03-12 ENCOUNTER — Encounter: Payer: Self-pay | Admitting: Student

## 2018-03-12 ENCOUNTER — Ambulatory Visit: Payer: Medicaid Other | Admitting: Certified Registered Nurse Anesthetist

## 2018-03-12 ENCOUNTER — Ambulatory Visit
Admission: RE | Admit: 2018-03-12 | Discharge: 2018-03-12 | Disposition: A | Discharge status: Home / Self Care | Payer: Medicaid Other | Source: Ambulatory Visit | Attending: Unknown Physician Specialty | Admitting: Unknown Physician Specialty

## 2018-03-12 DIAGNOSIS — Z87891 Personal history of nicotine dependence: Secondary | ICD-10-CM | POA: Insufficient documentation

## 2018-03-12 DIAGNOSIS — K635 Polyp of colon: Secondary | ICD-10-CM | POA: Diagnosis not present

## 2018-03-12 DIAGNOSIS — Z79899 Other long term (current) drug therapy: Secondary | ICD-10-CM | POA: Diagnosis not present

## 2018-03-12 DIAGNOSIS — G4730 Sleep apnea, unspecified: Secondary | ICD-10-CM | POA: Insufficient documentation

## 2018-03-12 DIAGNOSIS — I119 Hypertensive heart disease without heart failure: Secondary | ICD-10-CM | POA: Diagnosis not present

## 2018-03-12 DIAGNOSIS — J45909 Unspecified asthma, uncomplicated: Secondary | ICD-10-CM | POA: Insufficient documentation

## 2018-03-12 DIAGNOSIS — D122 Benign neoplasm of ascending colon: Secondary | ICD-10-CM | POA: Diagnosis not present

## 2018-03-12 DIAGNOSIS — D124 Benign neoplasm of descending colon: Secondary | ICD-10-CM | POA: Insufficient documentation

## 2018-03-12 DIAGNOSIS — Z6841 Body Mass Index (BMI) 40.0 and over, adult: Secondary | ICD-10-CM | POA: Insufficient documentation

## 2018-03-12 DIAGNOSIS — Z1211 Encounter for screening for malignant neoplasm of colon: Secondary | ICD-10-CM | POA: Insufficient documentation

## 2018-03-12 DIAGNOSIS — D123 Benign neoplasm of transverse colon: Secondary | ICD-10-CM | POA: Diagnosis not present

## 2018-03-12 HISTORY — PX: COLONOSCOPY WITH PROPOFOL: SHX5780

## 2018-03-12 SURGERY — COLONOSCOPY WITH PROPOFOL
Anesthesia: General

## 2018-03-12 MED ORDER — MIDAZOLAM HCL 5 MG/5ML IJ SOLN
INTRAMUSCULAR | Status: DC | PRN
  Administered 2018-03-12: 1 mg via INTRAVENOUS

## 2018-03-12 MED ORDER — FENTANYL CITRATE (PF) 100 MCG/2ML IJ SOLN
INTRAMUSCULAR | Status: DC | PRN
  Administered 2018-03-12: 50 ug via INTRAVENOUS

## 2018-03-12 MED ORDER — PROPOFOL 10 MG/ML IV BOLUS
INTRAVENOUS | Status: DC | PRN
  Administered 2018-03-12: 100 mg via INTRAVENOUS

## 2018-03-12 MED ORDER — SODIUM CHLORIDE 0.9 % IV SOLN: INTRAVENOUS | Status: DC

## 2018-03-12 MED ORDER — LIDOCAINE HCL (CARDIAC) PF 100 MG/5ML IV SOSY
PREFILLED_SYRINGE | INTRAVENOUS | Status: DC | PRN
  Administered 2018-03-12: 100 mg via INTRAVENOUS

## 2018-03-12 MED ORDER — SODIUM CHLORIDE 0.9 % IV SOLN
INTRAVENOUS | Status: DC
  Administered 2018-03-12: 11:00:00 via INTRAVENOUS

## 2018-03-12 MED ORDER — PROPOFOL 500 MG/50ML IV EMUL
INTRAVENOUS | Status: DC | PRN
  Administered 2018-03-12: 100 ug/kg/min via INTRAVENOUS

## 2018-03-12 NOTE — Op Note (Signed)
[facility] Gastroenterology Patient Name: [name] Procedure Date: 03/12/2018 9:58 AM MRN: [id number] Account #: [id number] Date of Birth: 05/16/1956 Admit Type: Outpatient Age: 62 Room: [facility] ENDO ROOM 3 Gender: Female Note Status: Finalized Procedure:            Colonoscopy Indications:          Screening for colorectal malignant neoplasm Providers:            Manya Silvas, MD Medicines:            Propofol per Anesthesia Complications:        No immediate complications. Procedure:            Pre-Anesthesia Assessment:                       - After reviewing the risks and benefits, the patient                        was deemed in satisfactory condition to undergo the                        procedure.                       After obtaining informed consent, the colonoscope was                        passed under direct vision. Throughout the procedure,                        the patient's blood pressure, pulse, and oxygen                        saturations were monitored continuously. The                        Colonoscope was introduced through the anus and                        advanced to the the cecum, identified by appendiceal                        orifice and ileocecal valve. The colonoscopy was                        performed without difficulty. The patient tolerated the                        procedure well. The quality of the bowel preparation                        was good. Findings:      Four sessile polyps were found in the ascending colon. The polyps were       diminutive in size. These polyps were removed with a jumbo cold forceps.       Resection and retrieval were complete.      Five sessile polyps were found in the ascending colon. The polyps were       small in size. These polyps were removed with a hot snare. Resection and       retrieval were complete.      Five  sessile polyps were found in the transverse colon. The polyps were        diminutive in size. These polyps were removed with a jumbo cold forceps.       Resection and retrieval were complete.      A small polyp was found in the descending colon. The polyp was sessile.       The polyp was removed with a hot snare. Resection and retrieval were       complete. To prevent bleeding after the polypectomy, one hemostatic clip       was successfully placed. There was no bleeding at the end of the       procedure.      A small polyp was found in the sigmoid colon. The polyp was sessile. The       polyp was removed with a hot snare. Resection and retrieval were       complete. To prevent bleeding after the polypectomy, one hemostatic clip       was successfully placed. There was no bleeding at the end of the       procedure. Impression:           - Four diminutive polyps in the ascending colon,                        removed with a jumbo cold forceps. Resected and                        retrieved.                       - Five small polyps in the ascending colon, removed                        with a hot snare. Resected and retrieved.                       - Five diminutive polyps in the transverse colon,                        removed with a jumbo cold forceps. Resected and                        retrieved.                       - One small polyp in the descending colon, removed with                        a hot snare. Resected and retrieved. Clip was placed.                       - One small polyp in the sigmoid colon, removed with a                        hot snare. Resected and retrieved. Clip was placed. Recommendation:       - Await pathology results. Manya Silvas, MD 03/12/2018 11:43:58 AM This report has been signed electronically. Number of Addenda: 0 Note Initiated On: 03/12/2018 9:58 AM Scope Withdrawal Time: 0 hours 25 minutes 49 seconds  Total Procedure Duration: 0 hours 31 minutes 44 seconds  [facility]

## 2018-03-12 NOTE — H&P (Signed)
Primary Care Physician:  Verlin Dike, MD Primary Gastroenterologist:  Dr. Vira Agar  Pre-Procedure History & Physical: HPI:  [name] is a 62 y.o. female is here for an colonoscopy.  Patient with previous Hx of colon polyps.   Past Medical History:  Diagnosis Date  . Arthritis   . Asthma   . Enlarged heart   . Hypertension   . Lymphedema   . Sleep apnea     Past Surgical History:  Procedure Laterality Date  . NO PAST SURGERIES      Prior to Admission medications   Medication Sig Start Date End Date Taking? Authorizing Provider  acetaminophen (TYLENOL) 500 MG tablet Take 1,000-1,500 mg by mouth every 6 (six) hours as needed for mild pain.    Yes [provider]  albuterol (PROVENTIL HFA;VENTOLIN HFA) 108 (90 BASE) MCG/ACT inhaler Inhale 2 puffs into the lungs every 6 (six) hours as needed for wheezing or shortness of breath.   Yes [provider]  amLODipine (NORVASC) 5 MG tablet Take 5 mg by mouth daily.    Yes [provider]  atenolol (TENORMIN) 50 MG tablet Take 50 mg by mouth daily.   Yes [provider]  diazepam (VALIUM) 2 MG tablet Take 1 tablet (2 mg total) by mouth every 8 (eight) hours as needed for anxiety. 01/07/18 01/07/19 Yes Lavonia Drafts, MD  furosemide (LASIX) 20 MG tablet Take 20 mg by mouth daily as needed (extreme fluid retention).    Yes [provider]  cetirizine (ZYRTEC) 10 MG tablet Take 10 mg by mouth daily as needed for allergies.    [provider]  oxyCODONE-acetaminophen (PERCOCET) 5-325 MG tablet Take 2 tablets by mouth every 6 (six) hours as needed for moderate pain or severe pain. Patient not taking: Reported on 01/07/2018 08/03/16   Earleen Newport, MD    Allergies as of 10/23/2017 - Review Complete 08/04/2016  Allergen Reaction Noted  . Ace inhibitors Hives and Swelling 01/23/2015  . Ibuprofen Hives and Swelling 01/23/2015    Family History  Problem Relation Age of  Onset  . Thyroid disease Other   . Breast cancer Maternal Aunt     Social History   Socioeconomic History  . Marital status: Single    Spouse name: Not on file  . Number of children: Not on file  . Years of education: Not on file  . Highest education level: Not on file  Occupational History  . Not on file  Social Needs  . Financial resource strain: Not on file  . Food insecurity:    Worry: Not on file    Inability: Not on file  . Transportation needs:    Medical: Not on file    Non-medical: Not on file  Tobacco Use  . Smoking status: Former Research scientist (life sciences)  . Smokeless tobacco: Current User    Types: Snuff  Substance and Sexual Activity  . Alcohol use: No  . Drug use: No  . Sexual activity: Not on file  Lifestyle  . Physical activity:    Days per week: Not on file    Minutes per session: Not on file  . Stress: Not on file  Relationships  . Social connections:    Talks on phone: Not on file    Gets together: Not on file    Attends religious service: Not on file    Active member of club or organization: Not on file    Attends meetings of clubs or organizations:  Not on file    Relationship status: Not on file  . Intimate partner violence:    Fear of current or ex partner: Not on file    Emotionally abused: Not on file    Physically abused: Not on file    Forced sexual activity: Not on file  Other Topics Concern  . Not on file  Social History Narrative  . Not on file    Review of Systems: See HPI, otherwise negative ROS  Physical Exam: BP 107/79   Pulse (!) 58   Temp 98.1 F (36.7 C) (Tympanic)   Resp 18   Ht 5\' 3"  (1.6 m)   Wt (!) 176.9 kg   SpO2 99%   BMI 69.09 kg/m  General:   Alert,  pleasant and cooperative in NAD Head:  Normocephalic and atraumatic. Neck:  Supple; no masses or thyromegaly. Lungs:  Clear throughout to auscultation.    Heart:  Regular rate and rhythm. Abdomen:  Soft, nontender and nondistended. Normal bowel sounds, without guarding,  and without rebound. Very obese abdomen. Neurologic:  Alert and  oriented x4;  grossly normal neurologically.  Impression/Plan: [name] is here for an colonoscopy to be performed for [facility] colon polyps  Risks, benefits, limitations, and alternatives regarding  colonoscopy have been reviewed with the patient.  Questions have been answered.  All parties agreeable.   Gaylyn Cheers, MD  03/12/2018, 10:49 AM

## 2018-03-12 NOTE — Anesthesia Post-op Follow-up Note (Signed)
Anesthesia QCDR form completed.        

## 2018-03-12 NOTE — Transfer of Care (Signed)
Immediate Anesthesia Transfer of Care Note  Patient: [name]  Procedure(s) Performed: COLONOSCOPY WITH PROPOFOL (N/A )  Patient Location: PACU  Anesthesia Type:General  Level of Consciousness: awake, alert , oriented and patient cooperative  Airway & Oxygen Therapy: Patient Spontanous Breathing and Patient connected to nasal cannula oxygen  Post-op Assessment: Report given to RN and Post -op Vital signs reviewed and stable  Post vital signs: Reviewed and stable  Last Vitals:  Vitals Value Taken Time  BP    Temp    Pulse 64 03/12/2018 11:42 AM  Resp 6 03/12/2018 11:42 AM  SpO2 100 % 03/12/2018 11:42 AM  Vitals shown include unvalidated device data.  Last Pain:  Vitals:   03/12/18 1141  TempSrc: (P) Tympanic  PainSc:          Complications: No apparent anesthesia complications

## 2018-03-12 NOTE — Anesthesia Preprocedure Evaluation (Signed)
Anesthesia Evaluation  Patient identified by MRN, date of birth, ID band Patient awake    Reviewed: Allergy & Precautions, H&P , NPO status , Patient's Chart, lab work & pertinent test results  Airway Mallampati: III  TM Distance: >3 FB Neck ROM: full    Dental  (+) Chipped, Poor Dentition, Missing   Pulmonary neg pulmonary ROS, asthma , sleep apnea , former smoker,    breath sounds clear to auscultation       Cardiovascular Exercise Tolerance: Poor hypertension, negative cardio ROS   Rhythm:regular Rate:Normal     Neuro/Psych negative neurological ROS  negative psych ROS   GI/Hepatic negative GI ROS, Neg liver ROS,   Endo/Other  negative endocrine ROSMorbid obesity  Renal/GU negative Renal ROS  negative genitourinary   Musculoskeletal  (+) Arthritis ,   Abdominal   Peds  Hematology negative hematology ROS (+)   Anesthesia Other Findings Past Medical History: No date: Arthritis No date: Asthma No date: Enlarged heart No date: Hypertension No date: Lymphedema No date: Sleep apnea  Past Surgical History: No date: NO PAST SURGERIES  BMI    Body Mass Index:  69.09 kg/m      Reproductive/Obstetrics negative OB ROS                             Anesthesia Physical Anesthesia Plan  ASA: III  Anesthesia Plan: General   Post-op Pain Management:    Induction:   PONV Risk Score and Plan: Propofol infusion and TIVA  Airway Management Planned: Natural Airway and Nasal Cannula  Additional Equipment:   Intra-op Plan:   Post-operative Plan:   Informed Consent: I have reviewed the patients History and Physical, chart, labs and discussed the procedure including the risks, benefits and alternatives for the proposed anesthesia with the patient or authorized representative who has indicated his/her understanding and acceptance.   Dental Advisory Given  Plan Discussed with:  Anesthesiologist, CRNA and Surgeon  Anesthesia Plan Comments:         Anesthesia Quick Evaluation

## 2018-03-13 ENCOUNTER — Encounter: Payer: Self-pay | Admitting: Unknown Physician Specialty

## 2018-03-13 NOTE — Anesthesia Postprocedure Evaluation (Signed)
Anesthesia Post Note  Patient: [name]  Procedure(s) Performed: COLONOSCOPY WITH PROPOFOL (N/A )  Patient location during evaluation: PACU Anesthesia Type: General Level of consciousness: awake and alert Pain management: pain level controlled Vital Signs Assessment: post-procedure vital signs reviewed and stable Respiratory status: spontaneous breathing, nonlabored ventilation and respiratory function stable Cardiovascular status: blood pressure returned to baseline and stable Postop Assessment: no apparent nausea or vomiting Anesthetic complications: no     Last Vitals:  Vitals:   03/12/18 1209 03/12/18 1212  BP: 96/64 120/76  Pulse:    Resp: 16 16  Temp:    SpO2: 98% 96%    Last Pain:  Vitals:   03/12/18 1212  TempSrc:   PainSc: 0-No pain                 Durenda Hurt

## 2018-03-14 LAB — SURGICAL PATHOLOGY

## 2018-03-24 ENCOUNTER — Ambulatory Visit (INDEPENDENT_AMBULATORY_CARE_PROVIDER_SITE_OTHER): Payer: Medicaid Other | Admitting: Vascular Surgery

## 2018-03-31 ENCOUNTER — Ambulatory Visit (INDEPENDENT_AMBULATORY_CARE_PROVIDER_SITE_OTHER): Payer: Medicaid Other | Admitting: Nurse Practitioner

## 2018-03-31 ENCOUNTER — Encounter (INDEPENDENT_AMBULATORY_CARE_PROVIDER_SITE_OTHER): Payer: Self-pay | Admitting: Nurse Practitioner

## 2018-03-31 VITALS — BP 150/77 | HR 61 | Resp 16 | Ht 63.0 in | Wt 390.0 lb

## 2018-03-31 DIAGNOSIS — G4730 Sleep apnea, unspecified: Secondary | ICD-10-CM

## 2018-03-31 DIAGNOSIS — I890 Lymphedema, not elsewhere classified: Secondary | ICD-10-CM

## 2018-03-31 DIAGNOSIS — I159 Secondary hypertension, unspecified: Secondary | ICD-10-CM

## 2018-03-31 DIAGNOSIS — F17211 Nicotine dependence, cigarettes, in remission: Secondary | ICD-10-CM

## 2018-03-31 DIAGNOSIS — L03116 Cellulitis of left lower limb: Secondary | ICD-10-CM | POA: Diagnosis not present

## 2018-03-31 DIAGNOSIS — F17290 Nicotine dependence, other tobacco product, uncomplicated: Secondary | ICD-10-CM

## 2018-03-31 NOTE — Progress Notes (Addendum)
Subjective:    Patient ID: [name], female    DOB: September 27, 1955, 62 y.o.   MRN: [id number] Chief Complaint  Patient presents with  . Follow-up    55yr follow up    HPI  Ileigh [name] is a 62 y.o. female presents today for a 2-year follow-up regarding the leg swelling.  The patient had increased drainage and pain in June/July, and developed a venous ulcer on her left lower extremity.  She was diagnosed with cellulitis.  It has since resolved..  The swelling has improved quite a bit and the pain associated with swelling has decreased substantially. There have not been any interval development of a ulcerations or wounds.  Since the previous visit the patient has been wearing graduated compression stockings and has noted little significant improvement in the lymphedema. The patient has been using compression routinely morning until night.  The patient also utilizes her lymphedema pump daily.  The patient also states elevation during the day and exercise is being done too.     Review of Systems: Negative Unless Checked Constitutional: [] Weight loss  [] Fever  [] Chills Cardiac: [] Chest pain   [] Chest pressure   [] Palpitations   [] Shortness of breath when laying flat   [] Shortness of breath with exertion. Vascular:  [] Pain in legs with walking   [] Pain in legs with standing  [] History of DVT   [] Phlebitis   [x] Swelling in legs   [] Varicose veins   [] Non-healing ulcers Pulmonary:   [] Uses home oxygen   [] Productive cough   [] Hemoptysis   [] Wheeze  [] COPD   [] Asthma Neurologic:  [] Dizziness   [] Seizures   [] History of stroke   [] History of TIA  [] Aphasia   [] Vissual changes   [] Weakness or numbness in arm   [] Weakness or numbness in leg Musculoskeletal:   [] Joint swelling   [] Joint pain   [] Low back pain Hematologic:  [] Easy bruising  [] Easy bleeding   [] Hypercoagulable state   [] Anemic Gastrointestinal:  [] Diarrhea   [] Vomiting  [] Gastroesophageal reflux/heartburn   [] Difficulty  swallowing. Genitourinary:  [] Chronic kidney disease   [] Difficult urination  [] Frequent urination   [] Blood in urine Skin:  [] Rashes   [x] Ulcers  Psychological:  [] History of anxiety   []  History of major depression.     Objective:   Physical Exam  BP (!) 150/77 (BP Location: Right Arm)   Pulse 61   Resp 16   Ht 5\' 3"  (1.6 m)   Wt (!) 390 lb (176.9 kg)   BMI 69.09 kg/m   Past Medical History:  Diagnosis Date  . Arthritis   . Asthma   . Enlarged heart   . Hypertension   . Lymphedema   . Sleep apnea      Gen: WD/WN, NAD Head: Hecker/AT, No temporalis wasting.  Ear/Nose/Throat: Hearing grossly intact, nares w/o erythema or drainage Eyes: PER, EOMI, sclera nonicteric.  Neck: Supple, no masses.  No JVD.  Pulmonary:  Good air movement, no use of accessory muscles.  Cardiac: RRR Vascular:  Elephantitis lymphedema of both lower extremities.  Unable to palpate pulses lower extremities due to body habitus.  Radial:  (R)[x] Palpable  ?Non-Palpable   ?Trace             (L)[x] Palpable  [] Non-Palpable   ?trace Brachial: (R)[] Palpable  [] Non-Palpable   [] Trace               (L)[] Palpable  [] Non-Palpable   ?trace Femoral:(R)[] Palpable  [] Non-Palpable   [] Trace              (  L)[] Palpable  [] Non-Palpable   ?trace Popliteal:(R)[] Palpable  [] Non-Palpable   [] Trace               (L)[] Palpable  [] Non-Palpable   ?trace Posterior Tibial:(R)[] Palpable  [] Non-Palpable   [] Trace                         (L)[] Palpable  [] Non-Palpable   [] Trace Dorsalis Pedis: (R)[] Palpable  [] Non-Palpable   [] Trace                         (L)[] Palpable  [] Non-Palpable   [] Trace Gastrointestinal: soft, non-distended. No guarding/no peritoneal signs.  Musculoskeletal: M/S 5/5 throughout.  No deformity or atrophy.  Neurologic: Pain and light touch intact in extremities.  Symmetrical.  Speech is fluent. Motor exam as listed above. Psychiatric: Judgment intact, Mood & affect appropriate for pt's clinical  situation. Dermatologic: No Venous rashes. No Ulcers Noted.  No changes consistent with cellulitis. Lymph : No Cervical lymphadenopathy, no lichenification or skin changes of chronic lymphedema.   Social History   Socioeconomic History  . Marital status: Single    Spouse name: Not on file  . Number of children: Not on file  . Years of education: Not on file  . Highest education level: Not on file  Occupational History  . Not on file  Social Needs  . Financial resource strain: Not on file  . Food insecurity:    Worry: Not on file    Inability: Not on file  . Transportation needs:    Medical: Not on file    Non-medical: Not on file  Tobacco Use  . Smoking status: Former Research scientist (life sciences)  . Smokeless tobacco: Current User    Types: Snuff  Substance and Sexual Activity  . Alcohol use: No  . Drug use: No  . Sexual activity: Not on file  Lifestyle  . Physical activity:    Days per week: Not on file    Minutes per session: Not on file  . Stress: Not on file  Relationships  . Social connections:    Talks on phone: Not on file    Gets together: Not on file    Attends religious service: Not on file    Active member of club or organization: Not on file    Attends meetings of clubs or organizations: Not on file    Relationship status: Not on file  . Intimate partner violence:    Fear of current or ex partner: Not on file    Emotionally abused: Not on file    Physically abused: Not on file    Forced sexual activity: Not on file  Other Topics Concern  . Not on file  Social History Narrative  . Not on file    Past Surgical History:  Procedure Laterality Date  . COLONOSCOPY WITH PROPOFOL N/A 03/12/2018   Procedure: COLONOSCOPY WITH PROPOFOL;  Surgeon: Manya Silvas, MD;  Location: [facility] ENDOSCOPY;  Service: Endoscopy;  Laterality: N/A;  . NO PAST SURGERIES      Family History  Problem Relation Age of Onset  . Thyroid disease Other   . Breast cancer Maternal Aunt      Allergies  Allergen Reactions  . Ace Inhibitors Hives and Swelling  . Ibuprofen Hives and Swelling       Assessment & Plan:   1. Lymphedema  No surgery or intervention at this point in time.    I have reviewed my discussion  with the patient regarding lymphedema and why it  causes symptoms.  Patient will continue wearing graduated compression stockings class 1 (20-30 mmHg) on a daily basis a prescription was given. The patient is reminded to put the stockings on first thing in the morning and removing them in the evening. The patient is instructed specifically not to sleep in the stockings.   In addition, behavioral modification throughout the day will be continued.  This will include frequent elevation (such as in a recliner), use of over the counter pain medications as needed and exercise such as walking.  I have reviewed systemic causes for chronic edema such as liver, kidney and cardiac etiologies and there does not appear to be any significant changes in these organ systems over the past year.  The patient is under the impression that these organ systems are all stable and unchanged.    The patient will continue aggressive use of the  lymph pump.  This will continue to improve the edema control and prevent sequela such as ulcers and infections.   The patient will follow-up with me on an annual basis.    2. Cellulitis of left lower extremity Cellulitis is currently resolved.  Patient has not had any issues with ulceration or weeping since this time.  Patient will contact us or her primary care provider if she begins to weep or have another open ulceration.  3. Secondary hypertension d/t obstructive sleep apnea  Continue antihypertensive medications as already ordered, these medications have been reviewed and there are no changes at this time.    Current Outpatient Medications on File Prior to Visit  Medication Sig Dispense Refill  . acetaminophen (TYLENOL) 500 MG tablet Take  1,000-1,500 mg by mouth every 6 (six) hours as needed for mild pain.     Marland Kitchen albuterol (PROVENTIL HFA;VENTOLIN HFA) 108 (90 BASE) MCG/ACT inhaler Inhale 2 puffs into the lungs every 6 (six) hours as needed for wheezing or shortness of breath.    Marland Kitchen amLODipine (NORVASC) 5 MG tablet Take 5 mg by mouth daily.     Marland Kitchen atenolol (TENORMIN) 50 MG tablet Take 50 mg by mouth daily.    . cetirizine (ZYRTEC) 10 MG tablet Take 10 mg by mouth daily as needed for allergies.    . diazepam (VALIUM) 2 MG tablet Take 1 tablet (2 mg total) by mouth every 8 (eight) hours as needed for anxiety. (Patient not taking: Reported on 03/31/2018) 20 tablet 0  . furosemide (LASIX) 20 MG tablet Take 20 mg by mouth daily as needed (extreme fluid retention).     Marland Kitchen oxyCODONE-acetaminophen (PERCOCET) 5-325 MG tablet Take 2 tablets by mouth every 6 (six) hours as needed for moderate pain or severe pain. (Patient not taking: Reported on 01/07/2018) 20 tablet 0   No current facility-administered medications on file prior to visit.     There are no Patient Instructions on file for this visit. No follow-ups on file.   Kris Hartmann, NP

## 2018-04-17 ENCOUNTER — Emergency Department: Payer: Medicaid Other

## 2018-04-17 ENCOUNTER — Other Ambulatory Visit: Payer: Self-pay

## 2018-04-17 ENCOUNTER — Inpatient Hospital Stay
Admission: EM | Admit: 2018-04-17 | Discharge: 2018-04-19 | DRG: 872 | Disposition: A | Discharge status: Home / Self Care | Payer: Medicaid Other | Attending: Family Medicine | Admitting: Family Medicine

## 2018-04-17 ENCOUNTER — Encounter: Payer: Self-pay | Admitting: Emergency Medicine

## 2018-04-17 ENCOUNTER — Inpatient Hospital Stay
Admit: 2018-04-17 | Discharge: 2018-04-17 | Disposition: A | Discharge status: Home / Self Care | Payer: Medicaid Other | Attending: Internal Medicine | Admitting: Internal Medicine

## 2018-04-17 DIAGNOSIS — A419 Sepsis, unspecified organism: Secondary | ICD-10-CM | POA: Diagnosis present

## 2018-04-17 DIAGNOSIS — Z888 Allergy status to other drugs, medicaments and biological substances status: Secondary | ICD-10-CM

## 2018-04-17 DIAGNOSIS — I10 Essential (primary) hypertension: Secondary | ICD-10-CM | POA: Diagnosis present

## 2018-04-17 DIAGNOSIS — Z87891 Personal history of nicotine dependence: Secondary | ICD-10-CM

## 2018-04-17 DIAGNOSIS — L03116 Cellulitis of left lower limb: Secondary | ICD-10-CM | POA: Diagnosis present

## 2018-04-17 DIAGNOSIS — E1165 Type 2 diabetes mellitus with hyperglycemia: Secondary | ICD-10-CM | POA: Diagnosis present

## 2018-04-17 DIAGNOSIS — Z23 Encounter for immunization: Secondary | ICD-10-CM | POA: Diagnosis not present

## 2018-04-17 DIAGNOSIS — Z803 Family history of malignant neoplasm of breast: Secondary | ICD-10-CM

## 2018-04-17 DIAGNOSIS — R109 Unspecified abdominal pain: Secondary | ICD-10-CM | POA: Diagnosis present

## 2018-04-17 DIAGNOSIS — G4733 Obstructive sleep apnea (adult) (pediatric): Secondary | ICD-10-CM | POA: Diagnosis present

## 2018-04-17 DIAGNOSIS — Z886 Allergy status to analgesic agent status: Secondary | ICD-10-CM

## 2018-04-17 DIAGNOSIS — E8809 Other disorders of plasma-protein metabolism, not elsewhere classified: Secondary | ICD-10-CM | POA: Diagnosis present

## 2018-04-17 DIAGNOSIS — J45909 Unspecified asthma, uncomplicated: Secondary | ICD-10-CM | POA: Diagnosis present

## 2018-04-17 DIAGNOSIS — L03115 Cellulitis of right lower limb: Secondary | ICD-10-CM | POA: Diagnosis present

## 2018-04-17 DIAGNOSIS — Z6841 Body Mass Index (BMI) 40.0 and over, adult: Secondary | ICD-10-CM

## 2018-04-17 LAB — PROTIME-INR
INR: 1.1
PROTHROMBIN TIME: 14.1 s (ref 11.4–15.2)

## 2018-04-17 LAB — URINALYSIS, ROUTINE W REFLEX MICROSCOPIC
BILIRUBIN URINE: NEGATIVE
Bacteria, UA: NONE SEEN
Glucose, UA: NEGATIVE mg/dL
Hgb urine dipstick: NEGATIVE
KETONES UR: NEGATIVE mg/dL
LEUKOCYTES UA: NEGATIVE
Nitrite: NEGATIVE
PH: 6 (ref 5.0–8.0)
Protein, ur: 30 mg/dL — AB
Specific Gravity, Urine: 1.023 (ref 1.005–1.030)

## 2018-04-17 LAB — CBC WITH DIFFERENTIAL/PLATELET
Abs Immature Granulocytes: 0.28 10*3/uL — ABNORMAL HIGH (ref 0.00–0.07)
BASOS ABS: 0 10*3/uL (ref 0.0–0.1)
Basophils Relative: 0 %
EOS ABS: 0 10*3/uL (ref 0.0–0.5)
Eosinophils Relative: 0 %
HEMATOCRIT: 42 % (ref 36.0–46.0)
Hemoglobin: 13 g/dL (ref 12.0–15.0)
IMMATURE GRANULOCYTES: 1 %
LYMPHS ABS: 0.9 10*3/uL (ref 0.7–4.0)
Lymphocytes Relative: 4 %
MCH: 26 pg (ref 26.0–34.0)
MCHC: 31 g/dL (ref 30.0–36.0)
MCV: 84 fL (ref 80.0–100.0)
Monocytes Absolute: 1.2 10*3/uL — ABNORMAL HIGH (ref 0.1–1.0)
Monocytes Relative: 5 %
NEUTROS ABS: 18.9 10*3/uL — AB (ref 1.7–7.7)
NEUTROS PCT: 90 %
NRBC: 0 % (ref 0.0–0.2)
Platelets: 129 10*3/uL — ABNORMAL LOW (ref 150–400)
RBC: 5 MIL/uL (ref 3.87–5.11)
RDW: 15.5 % (ref 11.5–15.5)
WBC: 21.3 10*3/uL — ABNORMAL HIGH (ref 4.0–10.5)

## 2018-04-17 LAB — LIPASE, BLOOD: LIPASE: 31 U/L (ref 11–51)

## 2018-04-17 LAB — TROPONIN I: Troponin I: <0.03 ng/mL (ref <0.03)

## 2018-04-17 LAB — COMPREHENSIVE METABOLIC PANEL
ALT: 16 U/L (ref 0–44)
ANION GAP: 11 (ref 5–15)
AST: 25 U/L (ref 15–41)
Albumin: 3.6 g/dL (ref 3.5–5.0)
Alkaline Phosphatase: 133 U/L — ABNORMAL HIGH (ref 38–126)
BUN: 18 mg/dL (ref 8–23)
CHLORIDE: 102 mmol/L (ref 98–111)
CO2: 27 mmol/L (ref 22–32)
Calcium: 8.8 mg/dL — ABNORMAL LOW (ref 8.9–10.3)
Creatinine, Ser: 0.94 mg/dL (ref 0.44–1.00)
GFR calc non Af Amer: >60 mL/min (ref >60)
Glucose, Bld: 106 mg/dL — ABNORMAL HIGH (ref 70–99)
POTASSIUM: 4.3 mmol/L (ref 3.5–5.1)
SODIUM: 140 mmol/L (ref 135–145)
Total Bilirubin: 1.2 mg/dL (ref 0.3–1.2)
Total Protein: 8.2 g/dL — ABNORMAL HIGH (ref 6.5–8.1)

## 2018-04-17 LAB — INFLUENZA PANEL BY PCR (TYPE A & B)
INFLAPCR: NEGATIVE
INFLBPCR: NEGATIVE

## 2018-04-17 LAB — GLUCOSE, CAPILLARY
GLUCOSE-CAPILLARY: 90 mg/dL (ref 70–99)
Glucose-Capillary: 92 mg/dL (ref 70–99)
Glucose-Capillary: 106 mg/dL — ABNORMAL HIGH (ref 70–99)
Glucose-Capillary: 111 mg/dL — ABNORMAL HIGH (ref 70–99)

## 2018-04-17 LAB — ECHOCARDIOGRAM COMPLETE
Height: 63 in
Weight: 6289.6 oz

## 2018-04-17 LAB — LACTIC ACID, PLASMA
LACTIC ACID, VENOUS: 2.3 mmol/L — AB (ref 0.5–1.9)
LACTIC ACID, VENOUS: 1 mmol/L (ref 0.5–1.9)

## 2018-04-17 LAB — MONONUCLEOSIS SCREEN: Mono Screen: NEGATIVE

## 2018-04-17 LAB — PROCALCITONIN: Procalcitonin: 0.95 ng/mL

## 2018-04-17 MED ORDER — ACETAMINOPHEN 325 MG PO TABS
650.0000 mg | ORAL_TABLET | Freq: Four times a day (QID) | ORAL | Status: DC | PRN
  Administered 2018-04-17 – 2018-04-19 (×3): 650 mg via ORAL
  Filled 2018-04-17 (×3): qty 2

## 2018-04-17 MED ORDER — ACETAMINOPHEN 500 MG PO TABS
1000.0000 mg | ORAL_TABLET | Freq: Once | ORAL | Status: AC
  Administered 2018-04-17: 1000 mg via ORAL
  Filled 2018-04-17: qty 2

## 2018-04-17 MED ORDER — MORPHINE SULFATE (PF) 4 MG/ML IV SOLN
4.0000 mg | Freq: Once | INTRAVENOUS | Status: AC
  Administered 2018-04-17: 4 mg via INTRAVENOUS
  Filled 2018-04-17: qty 1

## 2018-04-17 MED ORDER — DIPHENHYDRAMINE HCL 50 MG/ML IJ SOLN
25.0000 mg | Freq: Once | INTRAMUSCULAR | Status: AC
  Administered 2018-04-17: 25 mg via INTRAVENOUS
  Filled 2018-04-17: qty 1

## 2018-04-17 MED ORDER — AMLODIPINE BESYLATE 5 MG PO TABS
5.0000 mg | ORAL_TABLET | Freq: Every day | ORAL | Status: DC
  Administered 2018-04-19: 5 mg via ORAL
  Filled 2018-04-17 (×2): qty 1

## 2018-04-17 MED ORDER — ONDANSETRON HCL 4 MG PO TABS: 4.0000 mg | ORAL_TABLET | Freq: Four times a day (QID) | ORAL | Status: DC | PRN

## 2018-04-17 MED ORDER — VANCOMYCIN HCL 10 G IV SOLR
1750.0000 mg | Freq: Once | INTRAVENOUS | Status: AC
  Administered 2018-04-17: 1750 mg via INTRAVENOUS
  Filled 2018-04-17: qty 1750

## 2018-04-17 MED ORDER — SODIUM CHLORIDE 0.9 % IV SOLN
INTRAVENOUS | Status: DC | PRN
  Administered 2018-04-17: 500 mL via INTRAVENOUS

## 2018-04-17 MED ORDER — SODIUM CHLORIDE 0.9 % IV SOLN
2.0000 g | INTRAVENOUS | Status: DC
  Administered 2018-04-18 – 2018-04-19 (×2): 2 g via INTRAVENOUS
  Filled 2018-04-17: qty 20
  Filled 2018-04-17 (×2): qty 2

## 2018-04-17 MED ORDER — ONDANSETRON HCL 4 MG/2ML IJ SOLN: 4.0000 mg | Freq: Four times a day (QID) | INTRAMUSCULAR | Status: DC | PRN

## 2018-04-17 MED ORDER — VANCOMYCIN HCL IN DEXTROSE 1-5 GM/200ML-% IV SOLN
1000.0000 mg | Freq: Three times a day (TID) | INTRAVENOUS | Status: DC
  Administered 2018-04-17 – 2018-04-18 (×4): 1000 mg via INTRAVENOUS
  Filled 2018-04-17 (×6): qty 200

## 2018-04-17 MED ORDER — SODIUM CHLORIDE 0.9 % IV SOLN
1.0000 g | Freq: Once | INTRAVENOUS | Status: AC
  Administered 2018-04-17: 1 g via INTRAVENOUS
  Filled 2018-04-17: qty 10

## 2018-04-17 MED ORDER — ENOXAPARIN SODIUM 40 MG/0.4ML ~~LOC~~ SOLN
40.0000 mg | Freq: Two times a day (BID) | SUBCUTANEOUS | Status: DC
  Administered 2018-04-17 – 2018-04-19 (×5): 40 mg via SUBCUTANEOUS
  Filled 2018-04-17 (×4): qty 0.4

## 2018-04-17 MED ORDER — SODIUM CHLORIDE 0.9 % IV BOLUS
1000.0000 mL | Freq: Once | INTRAVENOUS | Status: AC
  Administered 2018-04-17: 1000 mL via INTRAVENOUS

## 2018-04-17 MED ORDER — ACETAMINOPHEN 650 MG RE SUPP: 650.0000 mg | Freq: Four times a day (QID) | RECTAL | Status: DC | PRN

## 2018-04-17 MED ORDER — SENNOSIDES-DOCUSATE SODIUM 8.6-50 MG PO TABS: 1.0000 | ORAL_TABLET | Freq: Every evening | ORAL | Status: DC | PRN

## 2018-04-17 MED ORDER — ONDANSETRON HCL 4 MG/2ML IJ SOLN
4.0000 mg | Freq: Once | INTRAMUSCULAR | Status: AC
  Administered 2018-04-17: 4 mg via INTRAVENOUS
  Filled 2018-04-17: qty 2

## 2018-04-17 MED ORDER — ALBUTEROL SULFATE (2.5 MG/3ML) 0.083% IN NEBU: 2.5000 mg | INHALATION_SOLUTION | Freq: Four times a day (QID) | RESPIRATORY_TRACT | Status: DC | PRN

## 2018-04-17 MED ORDER — INSULIN ASPART 100 UNIT/ML ~~LOC~~ SOLN: 0.0000 [IU] | Freq: Every day | SUBCUTANEOUS | Status: DC

## 2018-04-17 MED ORDER — INSULIN ASPART 100 UNIT/ML ~~LOC~~ SOLN: 0.0000 [IU] | Freq: Three times a day (TID) | SUBCUTANEOUS | Status: DC

## 2018-04-17 MED ORDER — SODIUM CHLORIDE 0.9 % IV SOLN
Freq: Once | INTRAVENOUS | Status: AC
  Administered 2018-04-17: 10:00:00 via INTRAVENOUS

## 2018-04-17 MED ORDER — BISACODYL 5 MG PO TBEC: 5.0000 mg | DELAYED_RELEASE_TABLET | Freq: Every day | ORAL | Status: DC | PRN

## 2018-04-17 MED ORDER — ATENOLOL 50 MG PO TABS
50.0000 mg | ORAL_TABLET | Freq: Every day | ORAL | Status: DC
  Administered 2018-04-19: 50 mg via ORAL
  Filled 2018-04-17 (×2): qty 1

## 2018-04-17 MED ORDER — IOPAMIDOL (ISOVUE-300) INJECTION 61%
125.0000 mL | Freq: Once | INTRAVENOUS | Status: AC | PRN
  Administered 2018-04-17: 125 mL via INTRAVENOUS

## 2018-04-17 NOTE — ED Notes (Signed)
Pt back from medical imaging

## 2018-04-17 NOTE — Progress Notes (Addendum)
[facility] [place] NAME: [name]    MR#:  [id number]  DATE OF BIRTH:  1956-03-18  SUBJECTIVE:  CHIEF COMPLAINT:   Chief Complaint  Patient presents with  . Abdominal Pain  Patient seen and evaluated today Was hypotensive this morning and 1 L of normal saline bolus was given Has abdominal discomfort No nausea  REVIEW OF SYSTEMS:    ROS  CONSTITUTIONAL: No documented fever. Has fatigue, weakness. No weight gain, no weight loss.  EYES: No blurry or double vision.  ENT: No tinnitus. No postnasal drip. No redness of the oropharynx.  RESPIRATORY: No cough, no wheeze, no hemoptysis. No dyspnea.  CARDIOVASCULAR: No chest pain. No orthopnea. No palpitations. No syncope.  GASTROINTESTINAL: No nausea, no vomiting or diarrhea. Has abdominal pain. No melena or hematochezia.  GENITOURINARY: No dysuria or hematuria.  ENDOCRINE: No polyuria or nocturia. No heat or cold intolerance.  HEMATOLOGY: No anemia. No bruising. No bleeding.  INTEGUMENTARY: No rashes. No lesions.  MUSCULOSKELETAL: No arthritis. No swelling. No gout.  NEUROLOGIC: No numbness, tingling, or ataxia. No seizure-type activity.  PSYCHIATRIC: No anxiety. No insomnia. No ADD.   DRUG ALLERGIES:   Allergies  Allergen Reactions  . Ace Inhibitors Hives and Swelling  . Ibuprofen Hives and Swelling    VITALS:  Blood pressure (!) 105/56, pulse 85, temperature 98.5 F (36.9 C), temperature source Oral, resp. rate 19, height 5\' 3"  (1.6 m), weight (!) 178.3 kg, SpO2 99 %.  PHYSICAL EXAMINATION:   Physical Exam  GENERAL:  62 y.o.-year-old patient lying in the bed with no acute distress.  EYES: Pupils equal, round, reactive to light and accommodation. No scleral icterus. Extraocular muscles intact.  HEENT: Head atraumatic, normocephalic. Oropharynx and nasopharynx clear.  NECK:  Supple, no jugular venous distention. No thyroid enlargement, no tenderness.  LUNGS: Normal breath  sounds bilaterally, no wheezing, rales, rhonchi. No use of accessory muscles of respiration.  CARDIOVASCULAR: S1, S2 normal. No murmurs, rubs, or gallops.  ABDOMEN: Soft, tenderness around umbilicus, nondistended. Bowel sounds present. No organomegaly or mass.  EXTREMITIES: Has lymph edema NEUROLOGIC: Cranial nerves II through XII are intact. No focal Motor or sensory deficits b/l.   PSYCHIATRIC: The patient is alert and oriented x 3.  SKIN: No obvious rash, lesion, or ulcer.   LABORATORY PANEL:   CBC Recent Labs  Lab 04/17/18 0232  WBC 21.3*  HGB 13.0  HCT 42.0  PLT 129*   ------------------------------------------------------------------------------------------------------------------ Chemistries  Recent Labs  Lab 04/17/18 0232  NA 140  K 4.3  CL 102  CO2 27  GLUCOSE 106*  BUN 18  CREATININE 0.94  CALCIUM 8.8*  AST 25  ALT 16  ALKPHOS 133*  BILITOT 1.2   ------------------------------------------------------------------------------------------------------------------  Cardiac Enzymes Recent Labs  Lab 04/17/18 0232  TROPONINI <0.03   ------------------------------------------------------------------------------------------------------------------  RADIOLOGY:  Ct Abdomen Pelvis W Contrast  Result Date: 04/17/2018 CLINICAL DATA:  Left-sided abdominal pain. Felt flushed and dizzy. EXAM: CT ABDOMEN AND PELVIS WITH CONTRAST TECHNIQUE: Multidetector CT imaging of the abdomen and pelvis was performed using the standard protocol following bolus administration of intravenous contrast. CONTRAST:  185mL ISOVUE-300 IOPAMIDOL (ISOVUE-300) INJECTION 61% COMPARISON:  None. FINDINGS: Lower chest: Lung bases are clear. Hepatobiliary: Mild diffuse fatty infiltration of the liver. No focal lesions. Gallbladder and bile ducts are unremarkable. Pancreas: Unremarkable. No pancreatic ductal dilatation or surrounding inflammatory changes. Spleen: Mild splenic enlargement. No focal  lesions. Adrenals/Urinary Tract: Adrenal glands are unremarkable. Kidneys are normal,  without renal calculi, focal lesion, or hydronephrosis. Bladder is unremarkable. Stomach/Bowel: Stomach is within normal limits. Appendix appears normal. No evidence of bowel wall thickening, distention, or inflammatory changes. Vascular/Lymphatic: Normal caliber abdominal aorta. Mild prominence of retroperitoneal lymph nodes, likely reactive. No significant lymphadenopathy. Reproductive: Uterus and bilateral adnexa are unremarkable. Other: No abdominal wall hernia or abnormality. No abdominopelvic ascites. Mild enlarged lymph nodes in the groin regions. Nonspecific but probably reactive. Musculoskeletal: Degenerative changes in the spine. No destructive bone lesions. IMPRESSION: 1. No acute process demonstrated in the abdomen or pelvis. No evidence of bowel obstruction or inflammation. Appendix is normal. 2. Mild diffuse fatty infiltration of the liver. Mild splenic enlargement. No focal lesions. Electronically Signed   By: Lucienne Capers M.D.   On: 04/17/2018 04:20   Dg Chest Port 1 View  Result Date: 04/17/2018 CLINICAL DATA:  Left-sided abdominal pain. Flushed and dizziness. EXAM: PORTABLE CHEST 1 VIEW COMPARISON:  01/24/2015 FINDINGS: Cardiac enlargement. No vascular congestion, edema, or consolidation. No blunting of costophrenic angles. No pneumothorax. Mediastinal contours appear intact. IMPRESSION: Cardiac enlargement. No evidence of active pulmonary disease. Electronically Signed   By: Lucienne Capers M.D.   On: 04/17/2018 03:30     ASSESSMENT AND PLAN:  62 year old female patient with history of obesity, chronic lymphedema, sleep apnea, hypertension currently under hospitalist service for abdominal discomfort, leukocytosis  -Systemic inflammatory response syndrome Probably secondary to cellulitis of the lower extremities Follow-up lactic acid IV fluids and antibiotics On vancomycin and Rocephin  antibiotics Follow-up cultures Follow WBC count  -Abdominal pain Rule out infective etiology CT abdomen no acute pathology Pain management  -Hypotension IV fluids  -DVT prophylaxis subcu Lovenox daily  -Diabetes mellitus type 2 Diabetic diet with sliding scale coverage with insulin   All the records are reviewed and case discussed with Care Management/Social Worker. Management plans discussed with the patient, family and they are in agreement.  CODE STATUS: Full code  DVT Prophylaxis: SCDs  TOTAL TIME TAKING CARE OF THIS PATIENT: 45 minutes.   POSSIBLE D/C IN 2 to 3 DAYS, DEPENDING ON CLINICAL CONDITION.  Saundra Shelling M.D on 04/17/2018 at 2:41 PM  Between 7am to 6pm - Pager - [phone]  After 6pm go to [email or web address] - password [email or web address] [facility]  Office  [phone]  CC: Primary care physician; Verlin Dike, MD  Note: This dictation was prepared with Dragon dictation along with smaller phrase technology. Any transcriptional errors that result from this process are unintentional.

## 2018-04-17 NOTE — Progress Notes (Signed)
CODE SEPSIS - PHARMACY COMMUNICATION  **Broad Spectrum Antibiotics should be administered within 1 hour of Sepsis diagnosis**  Time Code Sepsis Called/Page Received: 0219  Antibiotics Ordered: ceftriaxone  Time of 1st antibiotic administration: 0243  Additional action taken by pharmacy:   If necessary, Name of Provider/Nurse Contacted:     Tobie Lords ,PharmD Clinical Pharmacist  04/17/2018  3:24 AM

## 2018-04-17 NOTE — ED Provider Notes (Signed)
[facility] Emergency Department Provider Note  ____________________________________________   First MD Initiated Contact with Patient 04/17/18 [phone]     (approximate)  I have reviewed the triage vital signs and the nursing notes.   HISTORY  Chief Complaint Abdominal Pain   HPI [name] is a 62 y.o. female who comes to the emergency department with lower abdominal pain, urinary frequency, low back pain, shaking chills and [place] that began earlier today.  She has a past medical history of hypertension as well as morbid obesity and chronic lymphedema.  She said earlier today she began to sweat and then was intermittently hot and chilled.  She felt that her fingers were "blue and purple" and then became normal again.  She denies chest pain or shortness of breath.  She denies sore throat.  She denies dysuria but does report urinary frequency.  Her abdominal pain is primarily on her left side.  It is mild to moderate cramping and aching.  The pain in her low back is throbbing.  Nothing seems to make it better or worse.    Past Medical History:  Diagnosis Date  . Arthritis   . Asthma   . Enlarged heart   . Hypertension   . Lymphedema   . Sleep apnea     Patient Active Problem List   Diagnosis Date Noted  . Sepsis ([place]) 04/17/2018  . Lymphedema 08/04/2016  . OSA (obstructive sleep apnea) 08/04/2016  . HTN (hypertension) 08/04/2016  . Asthma 08/04/2016  . Cellulitis 01/24/2015  . Cellulitis of right leg 01/23/2015    Past Surgical History:  Procedure Laterality Date  . COLONOSCOPY WITH PROPOFOL N/A 03/12/2018   Procedure: COLONOSCOPY WITH PROPOFOL;  Surgeon: Manya Silvas, MD;  Location: [facility] ENDOSCOPY;  Service: Endoscopy;  Laterality: N/A;  . NO PAST SURGERIES      Prior to Admission medications   Medication Sig Start Date End Date Taking? Authorizing Provider  acetaminophen (TYLENOL) 500 MG tablet Take 1,000-1,500 mg by mouth every 6  (six) hours as needed for mild pain.    Yes [provider]  albuterol (PROVENTIL HFA;VENTOLIN HFA) 108 (90 BASE) MCG/ACT inhaler Inhale 2 puffs into the lungs every 6 (six) hours as needed for wheezing or shortness of breath.   Yes [provider]  amLODipine (NORVASC) 5 MG tablet Take 5 mg by mouth daily.    Yes [provider]  atenolol (TENORMIN) 50 MG tablet Take 50 mg by mouth daily.   Yes [provider]  cetirizine (ZYRTEC) 10 MG tablet Take 10 mg by mouth daily as needed for allergies.   Yes [provider]  diazepam (VALIUM) 2 MG tablet Take 1 tablet (2 mg total) by mouth every 8 (eight) hours as needed for anxiety. Patient not taking: Reported on 03/31/2018 01/07/18 01/07/19  Lavonia Drafts, MD    Allergies Ace inhibitors and Ibuprofen  Family History  Problem Relation Age of Onset  . Thyroid disease Other   . Breast cancer Maternal Aunt     Social History Social History   Tobacco Use  . Smoking status: Former Research scientist (life sciences)  . Smokeless tobacco: Current User    Types: Snuff  Substance Use Topics  . Alcohol use: No  . Drug use: No    Review of Systems Constitutional: Positive for fevers and chills Eyes: No visual changes. ENT: No sore throat. Cardiovascular: Denies chest pain. Respiratory: Denies shortness of breath. Gastrointestinal: Positive for abdominal pain.  No nausea, no  vomiting.  No diarrhea.  No constipation. Genitourinary: Negative for dysuria. Musculoskeletal: Positive for back pain. Skin: Negative for rash. Neurological: Negative for headaches, focal weakness or numbness.   ____________________________________________   PHYSICAL EXAM:  VITAL SIGNS: ED Triage Vitals  Enc Vitals Group     BP      Pulse      Resp      Temp      Temp src      SpO2      Weight      Height      Head Circumference      Peak Flow      Pain Score      Pain Loc      Pain Edu?      Excl. in [place]?     Constitutional: Alert  and oriented x4 appears somewhat uncomfortable nontoxic no diaphoresis Eyes: PERRL EOMI. Head: Atraumatic. Nose: No congestion/rhinnorhea. Mouth/Throat: No trismus Neck: No stridor.   Cardiovascular: Normal rate, regular rhythm. Grossly normal heart sounds.  Good peripheral circulation. Respiratory: Normal respiratory effort.  No retractions. Lungs CTAB and moving good air Gastrointestinal: Morbidly obese soft diffuse mild tenderness with no focality no peritonitis Musculoskeletal: Massively lymphedematous legs that are equal in size Neurologic:  Normal speech and language. No gross focal neurologic deficits are appreciated. Skin:  Skin is warm, dry and intact. No rash noted. Psychiatric: Mood and affect are normal. Speech and behavior are normal.    ____________________________________________   DIFFERENTIAL includes but not limited to  Sepsis, pyelonephritis, nephrolithiasis, appendicitis, diverticulitis ____________________________________________   LABS (all labs ordered are listed, but only abnormal results are displayed)  Labs Reviewed  LACTIC ACID, PLASMA - Abnormal; Notable for the following components:      Result Value   Lactic Acid, Venous 2.3 (*)    All other components within normal limits  COMPREHENSIVE METABOLIC PANEL - Abnormal; Notable for the following components:   Glucose, Bld 106 (*)    Calcium 8.8 (*)    Total Protein 8.2 (*)    Alkaline Phosphatase 133 (*)    All other components within normal limits  CBC WITH DIFFERENTIAL/PLATELET - Abnormal; Notable for the following components:   WBC 21.3 (*)    Platelets 129 (*)    Neutro Abs 18.9 (*)    Monocytes Absolute 1.2 (*)    Abs Immature Granulocytes 0.28 (*)    All other components within normal limits  URINALYSIS, ROUTINE W REFLEX MICROSCOPIC - Abnormal; Notable for the following components:   Color, Urine AMBER (*)    APPearance CLEAR (*)    Protein, ur 30 (*)    All other components within normal  limits  CULTURE, BLOOD (ROUTINE X 2)  CULTURE, BLOOD (ROUTINE X 2)  URINE CULTURE  LIPASE, BLOOD  TROPONIN I  PROCALCITONIN  PROTIME-INR  INFLUENZA PANEL BY PCR (TYPE A & B)  LACTIC ACID, PLASMA  MONONUCLEOSIS SCREEN    Lab work reviewed by me with a number of abnormalities.  Most concerning her elevated white count and elevated lactic acid concerning for acute bacterial infection and under resuscitation __________________________________________  EKG   ____________________________________________  RADIOLOGY  Chest x-ray reviewed by me with no acute disease CT abdomen pelvis reviewed by me with no clear etiology of his symptoms identified ____________________________________________   PROCEDURES  Procedure(s) performed: no  .Critical Care Performed by: Darel Hong, MD Authorized by: Darel Hong, MD   Critical care provider statement:    Critical care time (minutes):  30   Critical care time was exclusive of:  Separately billable procedures and treating other patients   Critical care was necessary to treat or prevent imminent or life-threatening deterioration of the following conditions:  Sepsis   Critical care was time spent personally by me on the following activities:  Development of treatment plan with patient or surrogate, discussions with consultants, evaluation of patient's response to treatment, examination of patient, obtaining history from patient or surrogate, ordering and performing treatments and interventions, ordering and review of laboratory studies, ordering and review of radiographic studies, pulse oximetry, re-evaluation of patient's condition and review of old charts    Critical Care performed: Yes  ____________________________________________   INITIAL IMPRESSION / ASSESSMENT AND PLAN / ED COURSE  Pertinent labs & imaging results that were available during my care of the patient were reviewed by me and considered in my medical decision  making (see chart for details).   As part of my medical decision making, I reviewed the following data within the [place] History obtained from family if available, nursing notes, old chart and ekg, as well as notes from prior ED visits.  The patient comes to the emergency department uncomfortable appearing with abdominal pain and urinary frequency.  She is 102.1 degrees with shaking Reiger's raising high clinical suspicion for sepsis.  Given her urinary frequency and low back pain and less concern for pyelonephritis so we will cover her with a gram of ceftriaxone while labs and blood cultures are drawn.  In the note urinalysis is pending but given her sepsis and abdominal pain she will also require CT scan with IV contrast.  The patient's urinalysis is negative for infection.  Broadening her antibiotics to vancomycin given the possibility of CT scan with no clear etiology of her symptoms identified.  We will check a flu swab as it is early in the season and it certainly could be a possibility.  I discussed the hospitalist who has graciously agreed to admit the patient to his service.      ____________________________________________   FINAL CLINICAL IMPRESSION(S) / ED DIAGNOSES  Final diagnoses:  Sepsis, due to unspecified organism, unspecified whether acute organ dysfunction present [facility])      NEW MEDICATIONS STARTED DURING THIS VISIT:  New Prescriptions   No medications on file     Note:  This document was prepared using Dragon voice recognition software and may include unintentional dictation errors.     Darel Hong, MD 04/17/18 [phone]

## 2018-04-17 NOTE — Progress Notes (Signed)
Pharmacy Antibiotic Note  [name] is a 62 y.o. female admitted on 04/17/2018 with sepsis.  Pharmacy has been consulted for vancomycin dosing. Patient received vanc 1.75g IV x 1 in ED  Plan: Will continue vanc 1g IV q8h   Will draw trough 10/11 @ 1100 prior to 4th dose.  Ke 0.0879 T1/2 8 hrs Goal trough 15 - 20 mcg/mL   Height: 5\' 3"  (160 cm) Weight: (!) 393 lb 8.3 oz (178.5 kg) IBW/kg (Calculated) : 52.4  Temp (24hrs), Avg:101.2 F (38.4 C), Min:100.3 F (37.9 C), Max:102.1 F (38.9 C)  Recent Labs  Lab 04/17/18 0232  WBC 21.3*  CREATININE 0.94  LATICACIDVEN 2.3*    Estimated Creatinine Clearance: 100.7 mL/min (by C-G formula based on SCr of 0.94 mg/dL).    Allergies  Allergen Reactions  . Ace Inhibitors Hives and Swelling  . Ibuprofen Hives and Swelling    Thank you for allowing pharmacy to be a part of this patient's care.  Tobie Lords, PharmD, BCPS Clinical Pharmacist 04/17/2018

## 2018-04-17 NOTE — ED Notes (Signed)
Pt is going to medical imaging.   

## 2018-04-17 NOTE — Plan of Care (Signed)

## 2018-04-17 NOTE — ED Triage Notes (Signed)
Pt arrived to the ED via EMS from home for complaints of left sided abdominal pain. Pt reports that she was at home when she felt flushed and dizzy; shortly after the Pt began to feel pain in her abdomen. Pt is AOx4 in no apparent distress.

## 2018-04-17 NOTE — Progress Notes (Signed)
*  PRELIMINARY RESULTS* Echocardiogram 2D Echocardiogram has been performed.  [name] Rilyn Scroggs 04/17/2018, 12:02 PM

## 2018-04-17 NOTE — ED Notes (Signed)
Dr. Estanislado Pandy down to see patient, informed him that the patients blood pressure is low and received 1 L NS bolus earlier in the morning.  Per Dr. Estanislado Pandy he will see the patient first.

## 2018-04-17 NOTE — ED Notes (Signed)
Lotion applied to dry skin areas.

## 2018-04-17 NOTE — Progress Notes (Signed)
Advanced care plan.  Purpose of the Encounter: CODE STATUS  Parties in Attendance: Patient  Patient's Decision Capacity: Good  Subjective/Patient's story: Patient presented to the emergency room for abdominal pain   Objective/Medical story Has elevated WBC count was hypotensive Needs IV antibiotics and IV fluids Has SIRS   Goals of care determination:  Advance care directives goals of care discussed Treatment plan discussed Patient wants everything done which includes CPR, intubation ventilator if the need arises   CODE STATUS: Full code   Time spent discussing advanced care planning: 16 minutes

## 2018-04-17 NOTE — Progress Notes (Addendum)
Patient admitted to unit. Oriented to room, call bell, and staff. Bed in lowest position. Fall safety plan reviewed. Full assessment to Epic. Skin assessment verified with Louretta Parma, RN. Telemetry box verification with tele clerk and Gerald Stabs NT- Box#: --[id number]---. Fluids infusing upon arrival. Per order, will stop after this bag finishes. Will continue to monitor.

## 2018-04-17 NOTE — ED Notes (Signed)
CT stated that IV in RFA infiltrated.

## 2018-04-17 NOTE — H&P (Signed)
[place] [facility] [place] NAME: [name]    MR#:  [id number]  DATE OF BIRTH:  25-Jan-1956  DATE OF ADMISSION:  04/17/2018  PRIMARY CARE PHYSICIAN: Verlin Dike, MD   REQUESTING/REFERRING PHYSICIAN: Darel Hong, MD  CHIEF COMPLAINT:   Chief Complaint  Patient presents with  . Abdominal Pain    HISTORY OF PRESENT ILLNESS:  [name]  is a 62 y.o. female with a known history of morbid obesity, chronic lymphedema, HTN, OSA (CPAP qHS) p/w FUO, suspected sepsis. Pt is AAOx3, and is a good historian. She endorses a 1wk Hx of intermittent frontal headache, sinus congestion, sneezing (w/o cough), rhinorrhea, postnasal drip. She states she uses CPAP qHS, which she believes has worsened her postnasal drip. She denies SOB/wheezing. She states that on Tuesday (04/15/2018), at sometime during the day she noticed that her L hand felt cold, and she saw that the fingertips of the L hand (only) were purplish. She states this resolved spontaneously over time. On Wednesday 04/16/2018, she began to developed chills and rigors. She also endorses flushing and lightheadedness, as well as fatigue/malaise and generalized weakness. She denies diaphoresis/night sweats or LOC. She states that her symptoms got progressively worse, until her son called EMS. She states she has noticed increased urinary frequency, but denies burning, pain, incontinence or hematuria. She endorses mild acute worsening of chronic R shoulder pain. She endorses L-sided (LUQ) abdominal discomfort radiating to the L low back. She endorses acute worsening of chronic low back pain. She endorses chronic lymphedema, as well as chronic erythema of the skin of the B/L LE, but states the erythema has not gotten worse. She denies leg pain or purulent discharge. Febrile (T 38.9), WBC 21.3, SIRS (+); PCT 0.95, Lactate 2.3. CXR, CT A/P and U/A do not demonstrate obvious source of infxn.  PAST MEDICAL  HISTORY:   Past Medical History:  Diagnosis Date  . Arthritis   . Asthma   . Enlarged heart   . Hypertension   . Lymphedema   . Sleep apnea     PAST SURGICAL HISTORY:   Past Surgical History:  Procedure Laterality Date  . COLONOSCOPY WITH PROPOFOL N/A 03/12/2018   Procedure: COLONOSCOPY WITH PROPOFOL;  Surgeon: Manya Silvas, MD;  Location: [facility];  Service: Endoscopy;  Laterality: N/A;  . NO PAST SURGERIES      SOCIAL HISTORY:   Social History   Tobacco Use  . Smoking status: Former Research scientist (life sciences)  . Smokeless tobacco: Current User    Types: Snuff  Substance Use Topics  . Alcohol use: No    FAMILY HISTORY:   Family History  Problem Relation Age of Onset  . Thyroid disease Other   . Breast cancer Maternal Aunt     DRUG ALLERGIES:   Allergies  Allergen Reactions  . Ace Inhibitors Hives and Swelling  . Ibuprofen Hives and Swelling    REVIEW OF SYSTEMS:   Review of Systems  Constitutional: Positive for chills and malaise/fatigue. Negative for diaphoresis, fever and weight loss.  HENT: Positive for congestion and sinus pain. Negative for ear pain, hearing loss, nosebleeds, sore throat and tinnitus.   Eyes: Negative for blurred vision, double vision and photophobia.  Respiratory: Negative for cough, hemoptysis, sputum production, shortness of breath and wheezing.   Cardiovascular: Positive for leg swelling (chronic lymphedema). Negative for chest pain, palpitations, orthopnea, claudication and PND.  Gastrointestinal: Positive for abdominal pain. Negative for blood in stool, constipation, diarrhea, heartburn,  melena, nausea and vomiting.  Genitourinary: Positive for flank pain and frequency. Negative for dysuria, hematuria and urgency.  Musculoskeletal: Positive for back pain and joint pain. Negative for falls, myalgias and neck pain.  Skin: Negative for itching and rash.  Neurological: Positive for dizziness, weakness and headaches. Negative for tingling,  tremors, sensory change, speech change, focal weakness, seizures and loss of consciousness.  Psychiatric/Behavioral: Negative for memory loss. The patient does not have insomnia.    MEDICATIONS AT HOME:   Prior to Admission medications   Medication Sig Start Date End Date Taking? Authorizing Provider  acetaminophen (TYLENOL) 500 MG tablet Take 1,000-1,500 mg by mouth every 6 (six) hours as needed for mild pain.    Yes [provider]  albuterol (PROVENTIL HFA;VENTOLIN HFA) 108 (90 BASE) MCG/ACT inhaler Inhale 2 puffs into the lungs every 6 (six) hours as needed for wheezing or shortness of breath.   Yes [provider]  amLODipine (NORVASC) 5 MG tablet Take 5 mg by mouth daily.    Yes [provider]  atenolol (TENORMIN) 50 MG tablet Take 50 mg by mouth daily.   Yes [provider]  cetirizine (ZYRTEC) 10 MG tablet Take 10 mg by mouth daily as needed for allergies.   Yes [provider]  diazepam (VALIUM) 2 MG tablet Take 1 tablet (2 mg total) by mouth every 8 (eight) hours as needed for anxiety. Patient not taking: Reported on 03/31/2018 01/07/18 01/07/19  Lavonia Drafts, MD      VITAL SIGNS:  Blood pressure 109/65, pulse 90, temperature 100.3 F (37.9 C), resp. rate 16, height 5\' 3"  (1.6 m), weight (!) 178.5 kg, SpO2 100 %.  PHYSICAL EXAMINATION:  Physical Exam  Constitutional: She is oriented to person, place, and time. She appears well-developed and well-nourished. She is active and cooperative. She appears toxic. She does not have a sickly appearance. She appears ill. No distress. She is not intubated.  HENT:  Head: Normocephalic and atraumatic.  Mouth/Throat: Oropharynx is clear and moist. No oropharyngeal exudate.  Eyes: Conjunctivae, EOM and lids are normal. No scleral icterus.  Neck: Neck supple. No JVD present. No thyromegaly present.  Cardiovascular: Normal rate, regular rhythm, S1 normal and S2 normal.  No extrasystoles are present.  Exam reveals no gallop, no S3, no S4, no distant heart sounds and no friction rub.  No murmur heard. Pulmonary/Chest: Effort normal and breath sounds normal. No accessory muscle usage or stridor. No apnea, no tachypnea and no bradypnea. She is not intubated. No respiratory distress. She has no decreased breath sounds. She has no wheezes. She has no rhonchi. She has no rales.  Abdominal: Soft. Normal appearance and bowel sounds are normal. She exhibits no distension, no fluid wave and no ascites. There is tenderness in the left upper quadrant. There is no rigidity, no rebound and no guarding.  Musculoskeletal: Normal range of motion. She exhibits edema. She exhibits no tenderness.  Lymphadenopathy:    She has no cervical adenopathy.  Neurological: She is alert and oriented to person, place, and time. She is not disoriented.  Skin: Skin is warm. She is diaphoretic. No erythema. No pallor.  Psychiatric: She has a normal mood and affect. Her speech is normal and behavior is normal. Judgment and thought content normal. Cognition and memory are normal.   LABORATORY PANEL:   CBC Recent Labs  Lab 04/17/18 0232  WBC 21.3*  HGB 13.0  HCT 42.0  PLT 129*   ------------------------------------------------------------------------------------------------------------------  Chemistries  Recent Labs  Lab 04/17/18 0232  NA 140  K 4.3  CL 102  CO2 27  GLUCOSE 106*  BUN 18  CREATININE 0.94  CALCIUM 8.8*  AST 25  ALT 16  ALKPHOS 133*  BILITOT 1.2   ------------------------------------------------------------------------------------------------------------------  Cardiac Enzymes Recent Labs  Lab 04/17/18 0232  TROPONINI <0.03   ------------------------------------------------------------------------------------------------------------------  RADIOLOGY:  Ct Abdomen Pelvis W Contrast  Result Date: 04/17/2018 CLINICAL DATA:  Left-sided abdominal pain. Felt flushed and dizzy. EXAM: CT  ABDOMEN AND PELVIS WITH CONTRAST TECHNIQUE: Multidetector CT imaging of the abdomen and pelvis was performed using the standard protocol following bolus administration of intravenous contrast. CONTRAST:  123mL ISOVUE-300 IOPAMIDOL (ISOVUE-300) INJECTION 61% COMPARISON:  None. FINDINGS: Lower chest: Lung bases are clear. Hepatobiliary: Mild diffuse fatty infiltration of the liver. No focal lesions. Gallbladder and bile ducts are unremarkable. Pancreas: Unremarkable. No pancreatic ductal dilatation or surrounding inflammatory changes. Spleen: Mild splenic enlargement. No focal lesions. Adrenals/Urinary Tract: Adrenal glands are unremarkable. Kidneys are normal, without renal calculi, focal lesion, or hydronephrosis. Bladder is unremarkable. Stomach/Bowel: Stomach is within normal limits. Appendix appears normal. No evidence of bowel wall thickening, distention, or inflammatory changes. Vascular/Lymphatic: Normal caliber abdominal aorta. Mild prominence of retroperitoneal lymph nodes, likely reactive. No significant lymphadenopathy. Reproductive: Uterus and bilateral adnexa are unremarkable. Other: No abdominal wall hernia or abnormality. No abdominopelvic ascites. Mild enlarged lymph nodes in the groin regions. Nonspecific but probably reactive. Musculoskeletal: Degenerative changes in the spine. No destructive bone lesions. IMPRESSION: 1. No acute process demonstrated in the abdomen or pelvis. No evidence of bowel obstruction or inflammation. Appendix is normal. 2. Mild diffuse fatty infiltration of the liver. Mild splenic enlargement. No focal lesions. Electronically Signed   By: Lucienne Capers M.D.   On: 04/17/2018 04:20   Dg Chest Port 1 View  Result Date: 04/17/2018 CLINICAL DATA:  Left-sided abdominal pain. Flushed and dizziness. EXAM: PORTABLE CHEST 1 VIEW COMPARISON:  01/24/2015 FINDINGS: Cardiac enlargement. No vascular congestion, edema, or consolidation. No blunting of costophrenic angles. No  pneumothorax. Mediastinal contours appear intact. IMPRESSION: Cardiac enlargement. No evidence of active pulmonary disease. Electronically Signed   By: Lucienne Capers M.D.   On: 04/17/2018 03:30   IMPRESSION AND PLAN:   A/P: 6F FUO, suspected sepsis, unidentified source. Hyperglycemia (w/ Hx T2NIDDM), hypocalcemia, elevated APhos, hyperproteinemia, lactate elevation, PCT elevation, leukocytosis, thrombocytopenia. -FUO, leukocytosis, SIRS, suspected sepsis, lactate elevation, PCT elevation: Febrile (T 38.9), leukocytosis (WBC 21.3, 90% neutrophil), SIRS (+). PCT 0.95, Lactate 2.3. CXR (-) infiltrate. CT A/P (+) "Mild diffuse fatty infiltration of the liver. Mild splenic enlargement," but is otherwise (-), w/ report stating, "No acute process demonstrated in the abdomen or pelvis. No evidence of bowel obstruction or inflammation. Appendix is normal." U/A (-) UTI. Presentation concerning for bacteremia. There is possibility of lower extremity cellulitis, however (-) new/worsening erythema per pt, (-) fluctuance/discharge. There is possibility of endocarditis, however I do not appreciate heart murmur. There is possibility of R shoulder septic joint, however (-) significant pain w/ AROM/PROM, (-) erythema/fluctuance/TTP. Lower suspicion for discitis, vertebral osteomyelitis, paraspinal abscess. Received IV contrast for CT A/P; may benefit from imaging of lumbar spine, R shoulder, head/face/sinuses, but I have held off on ordering further imaging studies due to IV contrast administration for CT A/P. BCx, UCx pending. Flu (-). Monospot pending. Echo pending. ID consult. Vanc + Ceftriaxone for now. Unclear if splenomegaly + thrombocytopenia, elevated APhos, hyperproteinemia are linked to this process. -Hyperglycemia: Pt endorses Hx T2NIDDM, prior use of oral agents. SSI. -Hypocalcemia:  Ionized calcium. -c/w home meds/formulary subs. -FEN/GI: Cardiac diabetic diet. -DVT PPx: Lovenox. -Code status: Full  code. -Disposition: Admission, > 2 midnights.   All the records are reviewed and case discussed with ED provider. Management plans discussed with the patient, family and they are in agreement.  CODE STATUS: Full code.  TOTAL TIME TAKING CARE OF THIS PATIENT: 90 minutes.    Arta Silence M.D on 04/17/2018 at 5:38 AM  Between 7am to 6pm - Pager - [phone]  After 6pm go to [email or web address] - Proofreader  [facility]  Office  [phone]  CC: Primary care physician; Verlin Dike, MD   Note: This dictation was prepared with Dragon dictation along with smaller phrase technology. Any transcriptional errors that result from this process are unintentional.

## 2018-04-18 LAB — GLUCOSE, CAPILLARY
GLUCOSE-CAPILLARY: 98 mg/dL (ref 70–99)
Glucose-Capillary: 105 mg/dL — ABNORMAL HIGH (ref 70–99)
Glucose-Capillary: 91 mg/dL (ref 70–99)
Glucose-Capillary: 81 mg/dL (ref 70–99)

## 2018-04-18 LAB — CBC
HEMATOCRIT: 34 % — AB (ref 36.0–46.0)
HEMOGLOBIN: 10.4 g/dL — AB (ref 12.0–15.0)
MCH: 25.9 pg — AB (ref 26.0–34.0)
MCHC: 30.6 g/dL (ref 30.0–36.0)
MCV: 84.8 fL (ref 80.0–100.0)
NRBC: 0 % (ref 0.0–0.2)
PLATELETS: 125 10*3/uL — AB (ref 150–400)
RBC: 4.01 MIL/uL (ref 3.87–5.11)
RDW: 16 % — ABNORMAL HIGH (ref 11.5–15.5)
WBC: 7.8 10*3/uL (ref 4.0–10.5)

## 2018-04-18 LAB — URINE CULTURE

## 2018-04-18 LAB — BASIC METABOLIC PANEL
Anion gap: 6 (ref 5–15)
BUN: 21 mg/dL (ref 8–23)
CHLORIDE: 106 mmol/L (ref 98–111)
CO2: 26 mmol/L (ref 22–32)
Calcium: 7.9 mg/dL — ABNORMAL LOW (ref 8.9–10.3)
Creatinine, Ser: 1.02 mg/dL — ABNORMAL HIGH (ref 0.44–1.00)
GFR calc Af Amer: >60 mL/min (ref >60)
GFR, EST NON AFRICAN AMERICAN: 58 mL/min — AB (ref >60)
GLUCOSE: 109 mg/dL — AB (ref 70–99)
POTASSIUM: 3.9 mmol/L (ref 3.5–5.1)
Sodium: 138 mmol/L (ref 135–145)

## 2018-04-18 LAB — VANCOMYCIN, TROUGH: Vancomycin Tr: 19 ug/mL (ref 15–20)

## 2018-04-18 MED ORDER — OXYCODONE-ACETAMINOPHEN 5-325 MG PO TABS: 1.0000 | ORAL_TABLET | Freq: Four times a day (QID) | ORAL | Status: DC | PRN

## 2018-04-18 MED ORDER — INFLUENZA VAC SPLIT QUAD 0.5 ML IM SUSY
0.5000 mL | PREFILLED_SYRINGE | INTRAMUSCULAR | Status: AC
  Administered 2018-04-19: 0.5 mL via INTRAMUSCULAR
  Filled 2018-04-18: qty 0.5

## 2018-04-18 NOTE — Progress Notes (Signed)
[place] [facility] [place] NAME: Blakelynn Scheeler    MR#:  [id number]  DATE OF BIRTH:  1956/05/31  SUBJECTIVE:  CHIEF COMPLAINT:   Chief Complaint  Patient presents with  . Abdominal Pain  Patient seen and evaluated today Blood pressure is borderline Had a low-grade fever last night And headache this morning No nausea  REVIEW OF SYSTEMS:    ROS  CONSTITUTIONAL: Had fever last night. Has fatigue, weakness. No weight gain, no weight loss.  EYES: No blurry or double vision.  ENT: No tinnitus. No postnasal drip. No redness of the oropharynx.  RESPIRATORY: No cough, no wheeze, no hemoptysis. No dyspnea.  CARDIOVASCULAR: No chest pain. No orthopnea. No palpitations. No syncope.  GASTROINTESTINAL: No nausea, no vomiting or diarrhea. Has abdominal pain. No melena or hematochezia.  GENITOURINARY: No dysuria or hematuria.  ENDOCRINE: No polyuria or nocturia. No heat or cold intolerance.  HEMATOLOGY: No anemia. No bruising. No bleeding.  INTEGUMENTARY: No rashes. No lesions.  MUSCULOSKELETAL: No arthritis. . No gout.  Chronic lymphedema NEUROLOGIC: No numbness, tingling, or ataxia. No seizure-type activity.  PSYCHIATRIC: No anxiety. No insomnia. No ADD.   DRUG ALLERGIES:   Allergies  Allergen Reactions  . Ace Inhibitors Hives and Swelling  . Ibuprofen Hives and Swelling    VITALS:  Blood pressure (!) 101/55, pulse 79, temperature 98.5 F (36.9 C), temperature source Oral, resp. rate (!) 22, height 5\' 3"  (1.6 m), weight (!) 178.3 kg, SpO2 99 %.  PHYSICAL EXAMINATION:   Physical Exam  GENERAL:  62 y.o.-year-old patient lying in the bed with no acute distress.  EYES: Pupils equal, round, reactive to light and accommodation. No scleral icterus. Extraocular muscles intact.  HEENT: Head atraumatic, normocephalic. Oropharynx and nasopharynx clear.  NECK:  Supple, no jugular venous distention. No thyroid enlargement, no tenderness.  LUNGS: Normal  breath sounds bilaterally, no wheezing, rales, rhonchi. No use of accessory muscles of respiration.  CARDIOVASCULAR: S1, S2 normal. No murmurs, rubs, or gallops.  ABDOMEN: Soft, tenderness around umbilicus, nondistended. Bowel sounds present. No organomegaly or mass.  EXTREMITIES: Has lymph edema NEUROLOGIC: Cranial nerves II through XII are intact. No focal Motor or sensory deficits b/l.   PSYCHIATRIC: The patient is alert and oriented x 3.  SKIN: No obvious rash, lesion, or ulcer.   LABORATORY PANEL:   CBC Recent Labs  Lab 04/18/18 0316  WBC 7.8  HGB 10.4*  HCT 34.0*  PLT 125*   ------------------------------------------------------------------------------------------------------------------ Chemistries  Recent Labs  Lab 04/17/18 0232 04/18/18 0316  NA 140 138  K 4.3 3.9  CL 102 106  CO2 27 26  GLUCOSE 106* 109*  BUN 18 21  CREATININE 0.94 1.02*  CALCIUM 8.8* 7.9*  AST 25  --   ALT 16  --   ALKPHOS 133*  --   BILITOT 1.2  --    ------------------------------------------------------------------------------------------------------------------  Cardiac Enzymes Recent Labs  Lab 04/17/18 0232  TROPONINI <0.03   ------------------------------------------------------------------------------------------------------------------  RADIOLOGY:  Ct Abdomen Pelvis W Contrast  Result Date: 04/17/2018 CLINICAL DATA:  Left-sided abdominal pain. Felt flushed and dizzy. EXAM: CT ABDOMEN AND PELVIS WITH CONTRAST TECHNIQUE: Multidetector CT imaging of the abdomen and pelvis was performed using the standard protocol following bolus administration of intravenous contrast. CONTRAST:  131mL ISOVUE-300 IOPAMIDOL (ISOVUE-300) INJECTION 61% COMPARISON:  None. FINDINGS: Lower chest: Lung bases are clear. Hepatobiliary: Mild diffuse fatty infiltration of the liver. No focal lesions. Gallbladder and bile ducts are unremarkable. Pancreas: Unremarkable. No pancreatic  ductal dilatation or  surrounding inflammatory changes. Spleen: Mild splenic enlargement. No focal lesions. Adrenals/Urinary Tract: Adrenal glands are unremarkable. Kidneys are normal, without renal calculi, focal lesion, or hydronephrosis. Bladder is unremarkable. Stomach/Bowel: Stomach is within normal limits. Appendix appears normal. No evidence of bowel wall thickening, distention, or inflammatory changes. Vascular/Lymphatic: Normal caliber abdominal aorta. Mild prominence of retroperitoneal lymph nodes, likely reactive. No significant lymphadenopathy. Reproductive: Uterus and bilateral adnexa are unremarkable. Other: No abdominal wall hernia or abnormality. No abdominopelvic ascites. Mild enlarged lymph nodes in the groin regions. Nonspecific but probably reactive. Musculoskeletal: Degenerative changes in the spine. No destructive bone lesions. IMPRESSION: 1. No acute process demonstrated in the abdomen or pelvis. No evidence of bowel obstruction or inflammation. Appendix is normal. 2. Mild diffuse fatty infiltration of the liver. Mild splenic enlargement. No focal lesions. Electronically Signed   By: Lucienne Capers M.D.   On: 04/17/2018 04:20   Dg Chest Port 1 View  Result Date: 04/17/2018 CLINICAL DATA:  Left-sided abdominal pain. Flushed and dizziness. EXAM: PORTABLE CHEST 1 VIEW COMPARISON:  01/24/2015 FINDINGS: Cardiac enlargement. No vascular congestion, edema, or consolidation. No blunting of costophrenic angles. No pneumothorax. Mediastinal contours appear intact. IMPRESSION: Cardiac enlargement. No evidence of active pulmonary disease. Electronically Signed   By: Lucienne Capers M.D.   On: 04/17/2018 03:30     ASSESSMENT AND PLAN:  62 year old female patient with history of obesity, chronic lymphedema, sleep apnea, hypertension currently under hospitalist service for abdominal discomfort, leukocytosis  -Systemic inflammatory response syndrome Cultures no growth so far Probably secondary to cellulitis of the  lower extremities Lactic acid has come down Leukocytosis improved Discontinue IV vancomycin Continue IV Rocephin antibiotic Follow-up cultures for 1 more day  -Lymphedema lower extremities Continue Ace wraps  -Abdominal pain improved CT abdomen no acute pathology Pain management  -Hypotension improving IV fluids  -DVT prophylaxis subcu Lovenox daily  -Diabetes mellitus type 2 Diabetic diet with sliding scale coverage with insulin   All the records are reviewed and case discussed with Care Management/Social Worker. Management plans discussed with the patient, family and they are in agreement.  CODE STATUS: Full code  DVT Prophylaxis: SCDs  TOTAL TIME TAKING CARE OF THIS PATIENT: 34 minutes.   POSSIBLE D/C IN 2 to 3 DAYS, DEPENDING ON CLINICAL CONDITION.  Saundra Shelling M.D on 04/18/2018 at 1:18 PM  Between 7am to 6pm - Pager - [phone]  After 6pm go to [email or web address] - password [email or web address] [facility]  Office  [phone]  CC: Primary care physician; Verlin Dike, MD  Note: This dictation was prepared with Dragon dictation along with smaller phrase technology. Any transcriptional errors that result from this process are unintentional.

## 2018-04-18 NOTE — Care Management Note (Signed)
Case Management Note  Patient Details  Name: [name] MRN: [id number] Date of Birth: 1955/11/21  Subjective/Objective:      Independent in all adls, denies issues accessing medical care, obtaining medications or with transportation.  Current with PCP.  No discharge needs identified at present by care manager or members of care team.  Hypotension improving today with IVF.  Pending blood cultures one more day.                  Action/Plan:   Expected Discharge Date:                  Expected Discharge Plan:  Home/Self Care  In-House Referral:     Discharge planning Services     Post Acute Care Choice:    Choice offered to:     DME Arranged:    DME Agency:     HH Arranged:    HH Agency:     Status of Service:  In process, will continue to follow  If discussed at Long Length of Stay Meetings, dates discussed:    Additional Comments:  Elza Rafter, RN 04/18/2018, 3:23 PM

## 2018-04-18 NOTE — Progress Notes (Signed)
Pharmacy Antibiotic Note  [name] is a 62 y.o. female admitted on 04/17/2018 with sepsis.  Pharmacy has been consulted for vancomycin dosing. Patient received vanc 1.75g IV x 1 in ED  Plan: 10/11 @ 1057 Trough 19  Will continue vanc 1g IV q8h     Ke 0.0879 T1/2 8 hrs Goal trough 15 - 20 mcg/mL   Height: 5\' 3"  (160 cm) Weight: (!) 393 lb 1.6 oz (178.3 kg) IBW/kg (Calculated) : 52.4  Temp (24hrs), Avg:99.8 F (37.7 C), Min:98.5 F (36.9 C), Max:101.1 F (38.4 C)  Recent Labs  Lab 04/17/18 0232 04/17/18 0550 04/18/18 0316 04/18/18 1057  WBC 21.3*  --  7.8  --   CREATININE 0.94  --  1.02*  --   LATICACIDVEN 2.3* 1.0  --   --   VANCOTROUGH  --   --   --  19    Estimated Creatinine Clearance: 92.8 mL/min (A) (by C-G formula based on SCr of 1.02 mg/dL (H)).    Allergies  Allergen Reactions  . Ace Inhibitors Hives and Swelling  . Ibuprofen Hives and Swelling    Thank you for allowing pharmacy to be a part of this patient's care.  Evelena Asa, PharmD Clinical Pharmacist 04/18/2018

## 2018-04-19 LAB — GLUCOSE, CAPILLARY
Glucose-Capillary: 84 mg/dL (ref 70–99)
Glucose-Capillary: 94 mg/dL (ref 70–99)

## 2018-04-19 LAB — HIV ANTIBODY (ROUTINE TESTING W REFLEX): HIV Screen 4th Generation wRfx: NONREACTIVE

## 2018-04-19 MED ORDER — CEFDINIR 300 MG PO CAPS: 300.0000 mg | ORAL_CAPSULE | Freq: Two times a day (BID) | ORAL | 0 refills | Status: DC

## 2018-04-19 NOTE — Discharge Summary (Signed)
[facility] [place] NAME: [name]    MR#:  [id number]  DATE OF BIRTH:  July 02, 1956  DATE OF ADMISSION:  04/17/2018 ADMITTING PHYSICIAN: Arta Silence, MD  DATE OF DISCHARGE: No discharge date for patient encounter.  PRIMARY CARE PHYSICIAN: Verlin Dike, MD    ADMISSION DIAGNOSIS:  Sepsis, due to unspecified organism, unspecified whether acute organ dysfunction present ([place]) [A41.9]  DISCHARGE DIAGNOSIS:  Active Problems:   Sepsis ([place])   SECONDARY DIAGNOSIS:   Past Medical History:  Diagnosis Date  . Arthritis   . Asthma   . Enlarged heart   . Hypertension   . Lymphedema   . Sleep apnea     HOSPITAL COURSE:  62 year old female patient with history of obesity, chronic lymphedema, sleep apnea, hypertension currently under hospitalist service for abdominal discomfort, leukocytosis  *Systemic inflammatory response syndrome Resolved Suspected due to cellulitis of the lower extremities Treated with IV vancomycin/Rocephin while in house, will be discharged to home on Ceftin ear twice daily with follow-up with primary care provider for reevaluation, cultures negative on day of discharge   *Chronic bilateral lower extremity lymphedema  Stable  Patient wraps her legs by herself, patient not interested in home health physical therapy or lymphedema clinic   *Acute abdominal pain  Etiology unknown Resolved  CT abdomen no acute pathology  *Acute Hypotension Resolved with IV fluids  *Chronic diabetes mellitus type 2 Controlled on current regiment  DISCHARGE CONDITIONS:   Stable  CONSULTS OBTAINED:  Treatment Team:  Arta Silence, MD Tsosie Billing, MD  DRUG ALLERGIES:   Allergies  Allergen Reactions  . Ace Inhibitors Hives and Swelling  . Ibuprofen Hives and Swelling    DISCHARGE MEDICATIONS:   Allergies as of 04/19/2018      Reactions   Ace Inhibitors Hives, Swelling    Ibuprofen Hives, Swelling      Medication List    TAKE these medications   acetaminophen 500 MG tablet Commonly known as:  TYLENOL Take 1,000-1,500 mg by mouth every 6 (six) hours as needed for mild pain.   albuterol 108 (90 Base) MCG/ACT inhaler Commonly known as:  PROVENTIL HFA;VENTOLIN HFA Inhale 2 puffs into the lungs every 6 (six) hours as needed for wheezing or shortness of breath.   amLODipine 5 MG tablet Commonly known as:  NORVASC Take 5 mg by mouth daily.   atenolol 50 MG tablet Commonly known as:  TENORMIN Take 50 mg by mouth daily.   cefdinir 300 MG capsule Commonly known as:  OMNICEF Take 1 capsule (300 mg total) by mouth 2 (two) times daily.   cetirizine 10 MG tablet Commonly known as:  ZYRTEC Take 10 mg by mouth daily as needed for allergies.   diazepam 2 MG tablet Commonly known as:  VALIUM Take 1 tablet (2 mg total) by mouth every 8 (eight) hours as needed for anxiety.        DISCHARGE INSTRUCTIONS:   If you experience worsening of your admission symptoms, develop shortness of breath, life threatening emergency, suicidal or homicidal thoughts you must seek medical attention immediately by calling 911 or calling your MD immediately  if symptoms less severe.  You Must read complete instructions/literature along with all the possible adverse reactions/side effects for all the Medicines you take and that have been prescribed to you. Take any new Medicines after you have completely understood and accept all the possible adverse reactions/side effects.   Please note  You were cared  for by a hospitalist during your hospital stay. If you have any questions about your discharge medications or the care you received while you were in the hospital after you are discharged, you can call the unit and asked to speak with the hospitalist on call if the hospitalist that took care of you is not available. Once you are discharged, your primary care physician will handle  any further medical issues. Please note that NO REFILLS for any discharge medications will be authorized once you are discharged, as it is imperative that you return to your primary care physician (or establish a relationship with a primary care physician if you do not have one) for your aftercare needs so that they can reassess your need for medications and monitor your lab values.    Today   CHIEF COMPLAINT:   Chief Complaint  Patient presents with  . Abdominal Pain    HISTORY OF PRESENT ILLNESS:  62 y.o. female with a known history of morbid obesity, chronic lymphedema, HTN, OSA (CPAP qHS) p/w FUO, suspected sepsis. Pt is AAOx3, and is a good historian. She endorses a 1wk Hx of intermittent frontal headache, sinus congestion, sneezing (w/o cough), rhinorrhea, postnasal drip. She states she uses CPAP qHS, which she believes has worsened her postnasal drip. She denies SOB/wheezing. She states that on Tuesday (04/15/2018), at sometime during the day she noticed that her L hand felt cold, and she saw that the fingertips of the L hand (only) were purplish. She states this resolved spontaneously over time. On Wednesday 04/16/2018, she began to developed chills and rigors. She also endorses flushing and lightheadedness, as well as fatigue/malaise and generalized weakness. She denies diaphoresis/night sweats or LOC. She states that her symptoms got progressively worse, until her son called EMS. She states she has noticed increased urinary frequency, but denies burning, pain, incontinence or hematuria. She endorses mild acute worsening of chronic R shoulder pain. She endorses L-sided (LUQ) abdominal discomfort radiating to the L low back. She endorses acute worsening of chronic low back pain. She endorses chronic lymphedema, as well as chronic erythema of the skin of the B/L LE, but states the erythema has not gotten worse. She denies leg pain or purulent discharge. Febrile (T 38.9), WBC 21.3, SIRS (+); PCT  0.95, Lactate 2.3. CXR, CT A/P and U/A do not demonstrate obvious source of infxn.  VITAL SIGNS:  Blood pressure (!) 115/56, pulse 74, temperature 97.8 F (36.6 C), temperature source Oral, resp. rate 18, height 5\' 3"  (1.6 m), weight (!) 178.3 kg, SpO2 96 %.  I/O:    Intake/Output Summary (Last 24 hours) at 04/19/2018 1045 Last data filed at 04/19/2018 0956 Gross per 24 hour  Intake 480 ml  Output 2850 ml  Net -2370 ml    PHYSICAL EXAMINATION:  GENERAL:  62 y.o.-year-old patient lying in the bed with no acute distress.  EYES: Pupils equal, round, reactive to light and accommodation. No scleral icterus. Extraocular muscles intact.  HEENT: Head atraumatic, normocephalic. Oropharynx and nasopharynx clear.  NECK:  Supple, no jugular venous distention. No thyroid enlargement, no tenderness.  LUNGS: Normal breath sounds bilaterally, no wheezing, rales,rhonchi or crepitation. No use of accessory muscles of respiration.  CARDIOVASCULAR: S1, S2 normal. No murmurs, rubs, or gallops.  ABDOMEN: Soft, non-tender, non-distended. Bowel sounds present. No organomegaly or mass.  EXTREMITIES: No pedal edema, cyanosis, or clubbing.  NEUROLOGIC: Cranial nerves II through XII are intact. Muscle strength 5/5 in all extremities. Sensation intact. Gait not checked.  PSYCHIATRIC:  The patient is alert and oriented x 3.  SKIN: No obvious rash, lesion, or ulcer.   DATA REVIEW:   CBC Recent Labs  Lab 04/18/18 0316  WBC 7.8  HGB 10.4*  HCT 34.0*  PLT 125*    Chemistries  Recent Labs  Lab 04/17/18 0232 04/18/18 0316  NA 140 138  K 4.3 3.9  CL 102 106  CO2 27 26  GLUCOSE 106* 109*  BUN 18 21  CREATININE 0.94 1.02*  CALCIUM 8.8* 7.9*  AST 25  --   ALT 16  --   ALKPHOS 133*  --   BILITOT 1.2  --     Cardiac Enzymes Recent Labs  Lab 04/17/18 0232  TROPONINI <0.03    Microbiology Results  Results for orders placed or performed during the hospital encounter of 04/17/18  Blood Culture  (routine x 2)     Status: None (Preliminary result)   Collection Time: 04/17/18  2:32 AM  Result Value Ref Range Status   Specimen Description BLOOD RIGHT FATTY CASTS  Final   Special Requests   Final    BOTTLES DRAWN AEROBIC AND ANAEROBIC Blood Culture results may not be optimal due to an excessive volume of blood received in culture bottles   Culture   Final    NO GROWTH 2 DAYS Performed at [facility], [address], [place], [place] [address]    Report Status PENDING  Incomplete  Urine culture     Status: Abnormal   Collection Time: 04/17/18  2:32 AM  Result Value Ref Range Status   Specimen Description   Final    URINE, RANDOM Performed at [facility] [address], [place], [place] [address]    Special Requests   Final    NONE Performed at [facility], [address], [place], [place] [address]    Culture MULTIPLE SPECIES PRESENT, SUGGEST RECOLLECTION (A)  Final   Report Status 04/18/2018 FINAL  Final  Blood Culture (routine x 2)     Status: None (Preliminary result)   Collection Time: 04/17/18  2:33 AM  Result Value Ref Range Status   Specimen Description BLOOD LEFT HAND  Final   Special Requests   Final    BOTTLES DRAWN AEROBIC AND ANAEROBIC Blood Culture results may not be optimal due to an inadequate volume of blood received in culture bottles   Culture   Final    NO GROWTH 2 DAYS Performed at [facility], [address], [place], [place] [address]    Report Status PENDING  Incomplete    RADIOLOGY:  No results found.  EKG:   Orders placed or performed during the hospital encounter of 04/17/18  . ED EKG 12-Lead  . ED EKG 12-Lead      Management plans discussed with the patient, family and they are in agreement.  CODE STATUS:     Code Status Orders  (From admission, onward)         Start     Ordered   04/17/18 0906  Full code  Continuous     04/17/18 0905        Code Status History    Date Active  Date Inactive Code Status Order ID Comments User Context   08/04/2016 0246 08/07/2016 1731 Full Code [id number]  Lance Coon, MD Inpatient   01/23/2015 2114 01/28/2015 1717 Full Code [id number]  Hower, Aaron Mose, MD ED      TOTAL TIME TAKING CARE OF THIS PATIENT: 45  minutes.    Avel Peace Lashina Milles M.D on 04/19/2018 at 10:45 AM  Between 7am to 6pm - Pager - [phone]  After 6pm go to [email or web address] - password [email or web address] [facility]  Office  [phone]  CC: Primary care physician; Verlin Dike, MD   Note: This dictation was prepared with Dragon dictation along with smaller phrase technology. Any transcriptional errors that result from this process are unintentional.

## 2018-04-19 NOTE — Discharge Instructions (Signed)
Abdominal Pain, Adult °Abdominal pain can be caused by many things. Often, abdominal pain is not serious and it gets better with no treatment or by being treated at home. However, sometimes abdominal pain is serious. Your health care provider will do a medical history and a physical exam to try to determine the cause of your abdominal pain. °Follow these instructions at home: °· Take over-the-counter and prescription medicines only as told by your health care provider. Do not take a laxative unless told by your health care provider. °· Drink enough fluid to keep your urine clear or pale yellow. °· Watch your condition for any changes. °· Keep all follow-up visits as told by your health care provider. This is important. °Contact a health care provider if: °· Your abdominal pain changes or gets worse. °· You are not hungry or you lose weight without trying. °· You are constipated or have diarrhea for more than 2-3 days. °· You have pain when you urinate or have a bowel movement. °· Your abdominal pain wakes you up at night. °· Your pain gets worse with meals, after eating, or with certain foods. °· You are throwing up and cannot keep anything down. °· You have a fever. °Get help right away if: °· Your pain does not go away as soon as your health care provider told you to expect. °· You cannot stop throwing up. °· Your pain is only in areas of the abdomen, such as the right side or the left lower portion of the abdomen. °· You have bloody or black stools, or stools that look like tar. °· You have severe pain, cramping, or bloating in your abdomen. °· You have signs of dehydration, such as: °? Dark urine, very little urine, or no urine. °? Cracked lips. °? Dry mouth. °? Sunken eyes. °? Sleepiness. °? Weakness. °This information is not intended to replace advice given to you by your health care provider. Make sure you discuss any questions you have with your health care provider. °Document Released: 04/04/2005 Document  Revised: 01/13/2016 Document Reviewed: 12/07/2015 °Elsevier Interactive Patient Education © 2018 [employer] Inc. ° °

## 2018-04-19 NOTE — Progress Notes (Signed)
Called EMS for non-emergent patient transport to home in [place]. Awaiting arrival. NT getting patient dressed now. [name]

## 2018-04-22 ENCOUNTER — Telehealth: Payer: Self-pay

## 2018-04-22 LAB — CULTURE, BLOOD (ROUTINE X 2)
CULTURE: NO GROWTH
Culture: NO GROWTH

## 2018-04-22 NOTE — Telephone Encounter (Signed)
Flagged on EMMI report for not having a follow up scheduled.  Called and spoke with patient who mentioned her PCP is at [facility], however needed to move to another location to be closer.  Reports she called and arranged an appointment with [facility] clinic for next Monday. No further questions or concerns currently.  I thanked her for her time and informed her she would receive one more automated call checking in during the next few days.

## 2018-10-25 ENCOUNTER — Encounter: Payer: Self-pay | Admitting: Emergency Medicine

## 2018-10-25 ENCOUNTER — Other Ambulatory Visit: Payer: Self-pay

## 2018-10-25 ENCOUNTER — Emergency Department: Payer: Medicaid Other

## 2018-10-25 ENCOUNTER — Emergency Department
Admission: EM | Admit: 2018-10-25 | Discharge: 2018-10-25 | Disposition: A | Discharge status: Home / Self Care | Payer: Medicaid Other | Attending: Emergency Medicine | Admitting: Emergency Medicine

## 2018-10-25 DIAGNOSIS — J45909 Unspecified asthma, uncomplicated: Secondary | ICD-10-CM | POA: Insufficient documentation

## 2018-10-25 DIAGNOSIS — I890 Lymphedema, not elsewhere classified: Secondary | ICD-10-CM | POA: Diagnosis not present

## 2018-10-25 DIAGNOSIS — L03116 Cellulitis of left lower limb: Secondary | ICD-10-CM | POA: Insufficient documentation

## 2018-10-25 DIAGNOSIS — R0602 Shortness of breath: Secondary | ICD-10-CM | POA: Diagnosis not present

## 2018-10-25 DIAGNOSIS — M79606 Pain in leg, unspecified: Secondary | ICD-10-CM | POA: Diagnosis present

## 2018-10-25 DIAGNOSIS — F17290 Nicotine dependence, other tobacco product, uncomplicated: Secondary | ICD-10-CM | POA: Insufficient documentation

## 2018-10-25 DIAGNOSIS — Z79899 Other long term (current) drug therapy: Secondary | ICD-10-CM | POA: Insufficient documentation

## 2018-10-25 DIAGNOSIS — I10 Essential (primary) hypertension: Secondary | ICD-10-CM | POA: Diagnosis not present

## 2018-10-25 LAB — BASIC METABOLIC PANEL
Anion gap: 7 (ref 5–15)
BUN: 20 mg/dL (ref 8–23)
CO2: 27 mmol/L (ref 22–32)
Calcium: 8.5 mg/dL — ABNORMAL LOW (ref 8.9–10.3)
Chloride: 105 mmol/L (ref 98–111)
Creatinine, Ser: 1.02 mg/dL — ABNORMAL HIGH (ref 0.44–1.00)
GFR calc Af Amer: >60 mL/min (ref >60)
GFR calc non Af Amer: 58 mL/min — ABNORMAL LOW (ref >60)
Glucose, Bld: 105 mg/dL — ABNORMAL HIGH (ref 70–99)
Potassium: 4 mmol/L (ref 3.5–5.1)
Sodium: 139 mmol/L (ref 135–145)

## 2018-10-25 LAB — CBC WITH DIFFERENTIAL/PLATELET
Abs Immature Granulocytes: 0.03 10*3/uL (ref 0.00–0.07)
Basophils Absolute: 0 10*3/uL (ref 0.0–0.1)
Basophils Relative: 0 %
Eosinophils Absolute: 0 10*3/uL (ref 0.0–0.5)
Eosinophils Relative: 0 %
HCT: 37.2 % (ref 36.0–46.0)
Hemoglobin: 11.6 g/dL — ABNORMAL LOW (ref 12.0–15.0)
Immature Granulocytes: 1 %
Lymphocytes Relative: 31 %
Lymphs Abs: 1.8 10*3/uL (ref 0.7–4.0)
MCH: 26.8 pg (ref 26.0–34.0)
MCHC: 31.2 g/dL (ref 30.0–36.0)
MCV: 85.9 fL (ref 80.0–100.0)
Monocytes Absolute: 0.8 10*3/uL (ref 0.1–1.0)
Monocytes Relative: 14 %
Neutro Abs: 3.1 10*3/uL (ref 1.7–7.7)
Neutrophils Relative %: 54 %
Platelets: 169 10*3/uL (ref 150–400)
RBC: 4.33 MIL/uL (ref 3.87–5.11)
RDW: 14 % (ref 11.5–15.5)
WBC: 5.7 10*3/uL (ref 4.0–10.5)
nRBC: 0 % (ref 0.0–0.2)

## 2018-10-25 LAB — TROPONIN I: Troponin I: <0.03 ng/mL (ref <0.03)

## 2018-10-25 LAB — LACTIC ACID, PLASMA: Lactic Acid, Venous: 1.5 mmol/L (ref 0.5–1.9)

## 2018-10-25 MED ORDER — CLINDAMYCIN PHOSPHATE 600 MG/50ML IV SOLN
600.0000 mg | Freq: Once | INTRAVENOUS | Status: AC
  Administered 2018-10-25: 600 mg via INTRAVENOUS
  Filled 2018-10-25: qty 50

## 2018-10-25 MED ORDER — CLINDAMYCIN HCL 300 MG PO CAPS: 300.0000 mg | ORAL_CAPSULE | Freq: Three times a day (TID) | ORAL | 0 refills | Status: DC

## 2018-10-25 NOTE — Discharge Instructions (Addendum)
Take the antibiotic as prescribed and finish the full course.  Return to the ER for new or worsening swelling, redness, drainage, pain, fever, weakness or any other new or worsening symptoms that concern you.

## 2018-10-25 NOTE — ED Provider Notes (Signed)
[facility] Emergency Department Provider Note ____________________________________________   First MD Initiated Contact with Patient 10/25/18 [phone]     (approximate)  I have reviewed the triage vital signs and the nursing notes.   HISTORY  Chief Complaint cellulitis and Shortness of Breath    HPI [name] is a 63 y.o. female with PMH as noted below who presents with concern for cellulitis to her left lower extremity, gradual onset over the last 10 days, and not improved after she finished a course of Keflex.  The patient states that she has had some hand pain when she sleeps causing her to sleep in a chair and this has subsequently caused her lower extremity lymphedema to become slightly worse.  She states that she called her PMD and was prescribed the Keflex.  The patient denies any fever or chills.  She states she has pain in the lower leg but it has not been spreading.  In addition, the patient states that she felt short of breath when transferring in and out of a car today, however she has no cough or chest pain.  Past Medical History:  Diagnosis Date  . Arthritis   . Asthma   . Enlarged heart   . Hypertension   . Lymphedema   . Sleep apnea     Patient Active Problem List   Diagnosis Date Noted  . Sepsis ([place]) 04/17/2018  . Lymphedema 08/04/2016  . OSA (obstructive sleep apnea) 08/04/2016  . HTN (hypertension) 08/04/2016  . Asthma 08/04/2016  . Cellulitis 01/24/2015  . Cellulitis of right leg 01/23/2015    Past Surgical History:  Procedure Laterality Date  . COLONOSCOPY WITH PROPOFOL N/A 03/12/2018   Procedure: COLONOSCOPY WITH PROPOFOL;  Surgeon: Manya Silvas, MD;  Location: [facility] ENDOSCOPY;  Service: Endoscopy;  Laterality: N/A;  . NO PAST SURGERIES      Prior to Admission medications   Medication Sig Start Date End Date Taking? Authorizing Provider  acetaminophen (TYLENOL) 500 MG tablet Take 1,000-1,500 mg by mouth every 6  (six) hours as needed for mild pain.     [provider]  albuterol (PROVENTIL HFA;VENTOLIN HFA) 108 (90 BASE) MCG/ACT inhaler Inhale 2 puffs into the lungs every 6 (six) hours as needed for wheezing or shortness of breath.    [provider]  amLODipine (NORVASC) 5 MG tablet Take 5 mg by mouth daily.     [provider]  atenolol (TENORMIN) 50 MG tablet Take 50 mg by mouth daily.    [provider]  cefdinir (OMNICEF) 300 MG capsule Take 1 capsule (300 mg total) by mouth 2 (two) times daily. 04/19/18   Salary, Avel Peace, MD  cetirizine (ZYRTEC) 10 MG tablet Take 10 mg by mouth daily as needed for allergies.    [provider]  clindamycin (CLEOCIN) 300 MG capsule Take 1 capsule (300 mg total) by mouth 3 (three) times daily for 10 days. 10/25/18 11/04/18  Arta Silence, MD  diazepam (VALIUM) 2 MG tablet Take 1 tablet (2 mg total) by mouth every 8 (eight) hours as needed for anxiety. Patient not taking: Reported on 03/31/2018 01/07/18 01/07/19  Lavonia Drafts, MD    Allergies Ace inhibitors and Ibuprofen  Family History  Problem Relation Age of Onset  . Thyroid disease Other   . Breast cancer Maternal Aunt     Social History Social History   Tobacco Use  . Smoking status: Former Research scientist (life sciences)  . Smokeless tobacco: Current User  Types: Snuff  Substance Use Topics  . Alcohol use: No  . Drug use: No    Review of Systems  Constitutional: No fever. Eyes: No redness. ENT: No sore throat. Cardiovascular: Denies chest pain. Respiratory: Positive for resolved shortness of breath. Gastrointestinal: No vomiting or diarrhea.  Genitourinary: Negative for flank pain.  Musculoskeletal: Negative for back pain. Skin: Positive for rash. Neurological: Negative for headache.   ____________________________________________   PHYSICAL EXAM:  VITAL SIGNS: ED Triage Vitals  Enc Vitals Group     BP 10/25/18 0728 138/69     Pulse Rate 10/25/18 0728 76      Resp 10/25/18 0728 18     Temp 10/25/18 0728 98 F (36.7 C)     Temp src --      SpO2 10/25/18 0728 95 %     Weight 10/25/18 0715 (!) 400 lb (181.4 kg)     Height 10/25/18 0715 5\' 3"  (1.6 m)     Head Circumference --      Peak Flow --      Pain Score 10/25/18 0712 6     Pain Loc --      Pain Edu? --      Excl. in [place]? --     Constitutional: Alert and oriented.  Comfortable appearing and in no acute distress. Eyes: Conjunctivae are normal.  Head: Atraumatic. Nose: No congestion/rhinnorhea. Mouth/Throat: Mucous membranes are moist.   Neck: Normal range of motion.  Cardiovascular: Normal rate, regular rhythm. Good peripheral circulation. Respiratory: Normal respiratory effort.  No retractions. Gastrointestinal: No distention.  Musculoskeletal: Bilateral lower extremity lymphedema. Neurologic:  Normal speech and language. No gross focal neurologic deficits are appreciated.  Skin:  Skin is warm and dry.  Approximately 15 cm area of left anterior lower leg with erythema and warmth with mild induration.  A few <1cm areas of skin oozing clear liquid but with no open ulcers or wounds. Psychiatric: Mood and affect are normal. Speech and behavior are normal.  ____________________________________________   LABS (all labs ordered are listed, but only abnormal results are displayed)  Labs Reviewed  BASIC METABOLIC PANEL - Abnormal; Notable for the following components:      Result Value   Glucose, Bld 105 (*)    Creatinine, Ser 1.02 (*)    Calcium 8.5 (*)    GFR calc non Af Amer 58 (*)    All other components within normal limits  CBC WITH DIFFERENTIAL/PLATELET - Abnormal; Notable for the following components:   Hemoglobin 11.6 (*)    All other components within normal limits  LACTIC ACID, PLASMA  TROPONIN I  LACTIC ACID, PLASMA   ____________________________________________  EKG  ED ECG REPORT I, Arta Silence, the attending physician, personally viewed and  interpreted this ECG.  Date: 10/25/2018 EKG Time: 827 Rate: 64 Rhythm: normal sinus rhythm QRS Axis: normal Intervals: normal ST/T Wave abnormalities: normal Narrative Interpretation: no evidence of acute ischemia  ____________________________________________  RADIOLOGY  CXR: No acute abnormalities  ____________________________________________   PROCEDURES  Procedure(s) performed: No  Procedures  Critical Care performed: No ____________________________________________   INITIAL IMPRESSION / ASSESSMENT AND PLAN / ED COURSE  Pertinent labs & imaging results that were available during my care of the patient were reviewed by me and considered in my medical decision making (see chart for details).  63 year old female with a history of lymphedema and other PMH as noted above presents with rash and pain to her left lower extremity concerning for cellulitis.  The patient states  that she called her PMD and was started on Keflex.  She completed the full course but states that the symptoms have worsened.  However she denies any fever or systemic symptoms.  In addition the patient reports feeling somewhat short of breath today when she was getting in and out of her car although she has no active shortness of breath currently.  She has no cough or chest pain.  On exam the patient is relatively comfortable appearing and her vital signs are normal.  She is afebrile.  The remainder of the exam is as described above.    Overall presentation is consistent with cellulitis.  Since she has apparently failed treatment with Keflex, we will obtain lab work-up to evaluate for systemic infection/sepsis.  Since her vital signs are normal and the area of cellulitis is relatively limited, if her lab work-up is reassuring I will likely treat with a broader spectrum oral antibiotic rather than admit.  Etiology of the patient's shortness of breath is unclear.  It is not ongoing and the patient has no other  symptoms on review of systems to suggest cardiac cause or pneumonia.  We will obtain chest x-ray, troponin, and reassess.  ----------------------------------------- 12:09 PM on 10/25/2018 -----------------------------------------  Lab work-up was unremarkable.  Patient was given a dose of IV clindamycin in the ED and discharged with a prescription for p.o. clindamycin.  I gave her thorough return precautions and she expressed understanding.  She was stable at the time of discharge.  __________________  [name] Loser was evaluated in Emergency Department on 10/25/2018 for the symptoms described in the history of present illness. She was evaluated in the context of the global COVID-19 pandemic, which necessitated consideration that the patient might be at risk for infection with the SARS-CoV-2 virus that causes COVID-19. Institutional protocols and algorithms that pertain to the evaluation of patients at risk for COVID-19 are in a state of rapid change based on information released by regulatory bodies including the CDC and federal and state organizations. These policies and algorithms were followed during the patient's care in the ED.   ____________________________________________   FINAL CLINICAL IMPRESSION(S) / ED DIAGNOSES  Final diagnoses:  Cellulitis of left lower leg      NEW MEDICATIONS STARTED DURING THIS VISIT:  Discharge Medication List as of 10/25/2018  9:32 AM    START taking these medications   Details  clindamycin (CLEOCIN) 300 MG capsule Take 1 capsule (300 mg total) by mouth 3 (three) times daily for 10 days., Starting Sat 10/25/2018, Until Tue 11/04/2018, Normal         Note:  This document was prepared using Dragon voice recognition software and may include unintentional dictation errors.    Arta Silence, MD 10/25/18 1209

## 2018-10-25 NOTE — ED Triage Notes (Signed)
Pt here for cellulitis of left leg. Just finished abx this past week and infection getting worse. No known fever but has felt flushed.  Labored after walking in to ED. Pt reports SHOB started this AM. Denies cough.  SHOB only with exertion.

## 2018-11-03 ENCOUNTER — Inpatient Hospital Stay
Admission: AD | Admit: 2018-11-03 | Discharge: 2018-11-05 | DRG: 603 | Disposition: A | Discharge status: Home Health | Payer: Medicaid Other | Source: Ambulatory Visit | Attending: Internal Medicine | Admitting: Internal Medicine

## 2018-11-03 ENCOUNTER — Other Ambulatory Visit: Payer: Self-pay

## 2018-11-03 DIAGNOSIS — Z803 Family history of malignant neoplasm of breast: Secondary | ICD-10-CM

## 2018-11-03 DIAGNOSIS — Z888 Allergy status to other drugs, medicaments and biological substances status: Secondary | ICD-10-CM

## 2018-11-03 DIAGNOSIS — G4733 Obstructive sleep apnea (adult) (pediatric): Secondary | ICD-10-CM | POA: Diagnosis present

## 2018-11-03 DIAGNOSIS — M1990 Unspecified osteoarthritis, unspecified site: Secondary | ICD-10-CM | POA: Diagnosis present

## 2018-11-03 DIAGNOSIS — Z886 Allergy status to analgesic agent status: Secondary | ICD-10-CM

## 2018-11-03 DIAGNOSIS — I10 Essential (primary) hypertension: Secondary | ICD-10-CM | POA: Diagnosis present

## 2018-11-03 DIAGNOSIS — Z87891 Personal history of nicotine dependence: Secondary | ICD-10-CM | POA: Diagnosis not present

## 2018-11-03 DIAGNOSIS — L03116 Cellulitis of left lower limb: Principal | ICD-10-CM | POA: Diagnosis present

## 2018-11-03 DIAGNOSIS — J4520 Mild intermittent asthma, uncomplicated: Secondary | ICD-10-CM | POA: Diagnosis present

## 2018-11-03 LAB — CBC
HCT: 39.5 % (ref 36.0–46.0)
Hemoglobin: 12.4 g/dL (ref 12.0–15.0)
MCH: 26.6 pg (ref 26.0–34.0)
MCHC: 31.4 g/dL (ref 30.0–36.0)
MCV: 84.6 fL (ref 80.0–100.0)
Platelets: 178 10*3/uL (ref 150–400)
RBC: 4.67 MIL/uL (ref 3.87–5.11)
RDW: 13.8 % (ref 11.5–15.5)
WBC: 6.1 10*3/uL (ref 4.0–10.5)
nRBC: 0 % (ref 0.0–0.2)

## 2018-11-03 LAB — BASIC METABOLIC PANEL
Anion gap: 11 (ref 5–15)
BUN: 16 mg/dL (ref 8–23)
CO2: 28 mmol/L (ref 22–32)
Calcium: 9 mg/dL (ref 8.9–10.3)
Chloride: 101 mmol/L (ref 98–111)
Creatinine, Ser: 0.87 mg/dL (ref 0.44–1.00)
GFR calc Af Amer: >60 mL/min (ref >60)
GFR calc non Af Amer: >60 mL/min (ref >60)
Glucose, Bld: 79 mg/dL (ref 70–99)
Potassium: 4 mmol/L (ref 3.5–5.1)
Sodium: 140 mmol/L (ref 135–145)

## 2018-11-03 MED ORDER — VANCOMYCIN HCL 10 G IV SOLR
2500.0000 mg | Freq: Once | INTRAVENOUS | Status: AC
  Administered 2018-11-03: 18:00:00 2500 mg via INTRAVENOUS
  Filled 2018-11-03: qty 2500

## 2018-11-03 MED ORDER — ONDANSETRON HCL 4 MG PO TABS: 4.0000 mg | ORAL_TABLET | Freq: Four times a day (QID) | ORAL | Status: DC | PRN

## 2018-11-03 MED ORDER — DIPHENHYDRAMINE HCL 50 MG/ML IJ SOLN: 25.0000 mg | Freq: Four times a day (QID) | INTRAMUSCULAR | Status: DC | PRN

## 2018-11-03 MED ORDER — ENOXAPARIN SODIUM 40 MG/0.4ML ~~LOC~~ SOLN
40.0000 mg | Freq: Two times a day (BID) | SUBCUTANEOUS | Status: DC
  Administered 2018-11-03 – 2018-11-05 (×4): 40 mg via SUBCUTANEOUS
  Filled 2018-11-03 (×4): qty 0.4

## 2018-11-03 MED ORDER — VANCOMYCIN HCL 10 G IV SOLR
2000.0000 mg | Freq: Three times a day (TID) | INTRAVENOUS | Status: DC
  Filled 2018-11-03 (×2): qty 2000

## 2018-11-03 MED ORDER — MORPHINE SULFATE (PF) 2 MG/ML IV SOLN: 1.0000 mg | INTRAVENOUS | Status: DC | PRN

## 2018-11-03 MED ORDER — LORATADINE 10 MG PO TABS
10.0000 mg | ORAL_TABLET | Freq: Every day | ORAL | Status: DC
  Administered 2018-11-03 – 2018-11-05 (×3): 10 mg via ORAL
  Filled 2018-11-03 (×3): qty 1

## 2018-11-03 MED ORDER — ATENOLOL 50 MG PO TABS
50.0000 mg | ORAL_TABLET | Freq: Every day | ORAL | Status: DC
  Administered 2018-11-03: 18:00:00 50 mg via ORAL
  Filled 2018-11-03 (×3): qty 1

## 2018-11-03 MED ORDER — VANCOMYCIN HCL IN DEXTROSE 1-5 GM/200ML-% IV SOLN: 1000.0000 mg | Freq: Once | INTRAVENOUS | Status: DC

## 2018-11-03 MED ORDER — ACETAMINOPHEN 650 MG RE SUPP: 650.0000 mg | Freq: Four times a day (QID) | RECTAL | Status: DC | PRN

## 2018-11-03 MED ORDER — SODIUM CHLORIDE 0.9 % IV SOLN
INTRAVENOUS | Status: DC | PRN
  Administered 2018-11-03: 250 mL via INTRAVENOUS

## 2018-11-03 MED ORDER — ALUM & MAG HYDROXIDE-SIMETH 200-200-20 MG/5ML PO SUSP
30.0000 mL | Freq: Four times a day (QID) | ORAL | Status: DC | PRN
  Filled 2018-11-03: qty 30

## 2018-11-03 MED ORDER — ALBUTEROL SULFATE (2.5 MG/3ML) 0.083% IN NEBU: 2.5000 mg | INHALATION_SOLUTION | Freq: Four times a day (QID) | RESPIRATORY_TRACT | Status: DC | PRN

## 2018-11-03 MED ORDER — VANCOMYCIN HCL 10 G IV SOLR
2000.0000 mg | Freq: Three times a day (TID) | INTRAVENOUS | Status: DC
  Administered 2018-11-04 – 2018-11-05 (×5): 2000 mg via INTRAVENOUS
  Filled 2018-11-03 (×10): qty 2000

## 2018-11-03 MED ORDER — POLYETHYLENE GLYCOL 3350 17 G PO PACK: 17.0000 g | PACK | Freq: Every day | ORAL | Status: DC | PRN

## 2018-11-03 MED ORDER — ONDANSETRON HCL 4 MG/2ML IJ SOLN
4.0000 mg | Freq: Four times a day (QID) | INTRAMUSCULAR | Status: DC | PRN
  Administered 2018-11-03: 19:00:00 4 mg via INTRAVENOUS
  Filled 2018-11-03: qty 2

## 2018-11-03 MED ORDER — AMLODIPINE BESYLATE 5 MG PO TABS
5.0000 mg | ORAL_TABLET | Freq: Every day | ORAL | Status: DC
  Administered 2018-11-03: 18:00:00 5 mg via ORAL
  Filled 2018-11-03 (×3): qty 1

## 2018-11-03 MED ORDER — ACETAMINOPHEN 325 MG PO TABS: 650.0000 mg | ORAL_TABLET | Freq: Four times a day (QID) | ORAL | Status: DC | PRN

## 2018-11-03 NOTE — Consult Note (Signed)
Pharmacy Antibiotic Note  [name] is a 63 y.o. female admitted on 11/03/2018 with cellulitis.  Pharmacy has been consulted for vancomycin dosing.  Plan: Will start maintenance dose of Vancomycin 2000 mg Q8H.  AUC Goal: 400-550 Expected AUC: 477.4 Css min: 14.3     Temp (24hrs), Avg:98.6 F (37 C), Min:98.6 F (37 C), Max:98.6 F (37 C)  No results for input(s): WBC, CREATININE, LATICACIDVEN, VANCOTROUGH, VANCOPEAK, VANCORANDOM, GENTTROUGH, GENTPEAK, GENTRANDOM, TOBRATROUGH, TOBRAPEAK, TOBRARND, AMIKACINPEAK, AMIKACINTROU, AMIKACIN in the last 168 hours.  Estimated Creatinine Clearance: 92.7 mL/min (A) (by C-G formula based on SCr of 1.02 mg/dL (H)).    Allergies  Allergen Reactions  . Ace Inhibitors Hives and Swelling  . Ibuprofen Hives and Swelling    Antimicrobials this admission: 4/27 Vancomycin >>  Dose adjustments this admission: N/A  Microbiology results: 4/27 BCx: pending    Thank you for allowing pharmacy to be a part of this patient's care.  Aundria Mems 11/03/2018 5:49 PM

## 2018-11-03 NOTE — Progress Notes (Signed)
Anticoagulation monitoring(Lovenox):  63 yo female ordered Lovenox 40 mg Q24h  There were no vitals filed for this visit. BMI 71    Lab Results  Component Value Date   CREATININE 1.02 (H) 10/25/2018   CREATININE 1.02 (H) 04/18/2018   CREATININE 0.94 04/17/2018   Estimated Creatinine Clearance: 92.7 mL/min (A) (by C-G formula based on SCr of 1.02 mg/dL (H)). Hemoglobin & Hematocrit     Component Value Date/Time   HGB 11.6 (L) 10/25/2018 0828   HGB 12.8 09/28/2013 1838   HCT 37.2 10/25/2018 0828   HCT 39.8 09/28/2013 1838     Per Protocol for Patient with estCrcl > 30 ml/min and BMI > 40, will transition to Lovenox 40 mg Q12h.

## 2018-11-03 NOTE — H&P (Addendum)
[place] at [place] NAME: [name]    MR#:  [id number]  DATE OF BIRTH:  02-07-1956  DATE OF ADMISSION:  11/03/2018  PRIMARY CARE PHYSICIAN: [facility], [place]   REQUESTING/REFERRING PHYSICIAN: Dr. Lennox Grumbles  CHIEF COMPLAINT:  Leg infection   HISTORY OF PRESENT ILLNESS:  [name]  is a 63 y.o. female with a known history of severe bilateral chronic lymphedema, hypertension, asthma, OSA, morbid obesity who was directly admitted from [facility] with worsening left lower extremity cellulitis that has failed outpatient treatment.  She was initially treated with a course of Keflex, which did not help.  She presented to the ED on 4/18 with worsening cellulitis.  She was given a dose of IV clindamycin and was discharged home on clindamycin for 10 days.  While on the clindamycin, the redness and warmth continue to worsen.  She denies any fevers or chills.  She denies any chest pain, shortness of breath, nausea, vomiting.  PAST MEDICAL HISTORY:   Past Medical History:  Diagnosis Date  . Arthritis   . Asthma   . Enlarged heart   . Hypertension   . Lymphedema   . Sleep apnea     PAST SURGICAL HISTORY:   Past Surgical History:  Procedure Laterality Date  . COLONOSCOPY WITH PROPOFOL N/A 03/12/2018   Procedure: COLONOSCOPY WITH PROPOFOL;  Surgeon: Manya Silvas, MD;  Location: [facility] ENDOSCOPY;  Service: Endoscopy;  Laterality: N/A;  . NO PAST SURGERIES      SOCIAL HISTORY:   Social History   Tobacco Use  . Smoking status: Former Research scientist (life sciences)  . Smokeless tobacco: Current User    Types: Snuff  Substance Use Topics  . Alcohol use: No    FAMILY HISTORY:   Family History  Problem Relation Age of Onset  . Thyroid disease Other   . Breast cancer Maternal Aunt     DRUG ALLERGIES:   Allergies  Allergen Reactions  . Ace Inhibitors Hives and Swelling  . Ibuprofen Hives and Swelling    REVIEW OF  SYSTEMS:   Review of Systems  Constitutional: Positive for malaise/fatigue. Negative for chills and fever.  HENT: Negative for congestion.   Eyes: Negative for blurred vision and double vision.  Respiratory: Negative for cough and shortness of breath.   Cardiovascular: Positive for leg swelling. Negative for chest pain and palpitations.  Gastrointestinal: Negative for nausea and vomiting.  Genitourinary: Negative for dysuria and urgency.  Musculoskeletal: Negative for back pain and neck pain.  Neurological: Negative for dizziness and headaches.  Psychiatric/Behavioral: Negative for depression. The patient is not nervous/anxious.     MEDICATIONS AT HOME:   Prior to Admission medications   Medication Sig Start Date End Date Taking? Authorizing Provider  acetaminophen (TYLENOL) 500 MG tablet Take 1,000-1,500 mg by mouth every 6 (six) hours as needed for mild pain.     [provider]  albuterol (PROVENTIL HFA;VENTOLIN HFA) 108 (90 BASE) MCG/ACT inhaler Inhale 2 puffs into the lungs every 6 (six) hours as needed for wheezing or shortness of breath.    [provider]  amLODipine (NORVASC) 5 MG tablet Take 5 mg by mouth daily.     [provider]  atenolol (TENORMIN) 50 MG tablet Take 50 mg by mouth daily.    [provider]  cefdinir (OMNICEF) 300 MG capsule Take 1 capsule (300 mg total) by mouth 2 (two) times daily. 04/19/18   Salary, Avel Peace,  MD  cetirizine (ZYRTEC) 10 MG tablet Take 10 mg by mouth daily as needed for allergies.    [provider]  clindamycin (CLEOCIN) 300 MG capsule Take 1 capsule (300 mg total) by mouth 3 (three) times daily for 10 days. 10/25/18 11/04/18  Arta Silence, MD  diazepam (VALIUM) 2 MG tablet Take 1 tablet (2 mg total) by mouth every 8 (eight) hours as needed for anxiety. Patient not taking: Reported on 03/31/2018 01/07/18 01/07/19  Lavonia Drafts, MD      VITAL SIGNS:  There were no vitals taken for this  visit.  PHYSICAL EXAMINATION:  Physical Exam  GENERAL:  63 y.o.-year-old patient lying in the bed with no acute distress.  EYES: Pupils equal, round, reactive to light and accommodation. No scleral icterus. Extraocular muscles intact.  HEENT: Head atraumatic, normocephalic. Oropharynx and nasopharynx clear.  NECK:  Supple, no jugular venous distention. No thyroid enlargement, no tenderness.  LUNGS: Normal breath sounds bilaterally, no wheezing, rales,rhonchi or crepitation. No use of accessory muscles of respiration.  CARDIOVASCULAR: RRR, S1, S2 normal. No murmurs, rubs, or gallops.  ABDOMEN: Soft, nontender, nondistended. Bowel sounds present. No organomegaly or mass.  EXTREMITIES: No cyanosis, or clubbing. + Severe chronic bilateral lymphedema.  Left lower extremity is erythematous and warm to the touch. + Weeping present.  See picture below. NEUROLOGIC: Cranial nerves II through XII are intact. Muscle strength 5/5 in all extremities. Sensation intact. Gait not checked.  PSYCHIATRIC: The patient is alert and oriented x 3.  SKIN: Left lower extremity erythema       LABORATORY PANEL:   CBC No results for input(s): WBC, HGB, HCT, PLT in the last 168 hours. ------------------------------------------------------------------------------------------------------------------  Chemistries  No results for input(s): NA, K, CL, CO2, GLUCOSE, BUN, CREATININE, CALCIUM, MG, AST, ALT, ALKPHOS, BILITOT in the last 168 hours.  Invalid input(s): GFRCGP ------------------------------------------------------------------------------------------------------------------  Cardiac Enzymes No results for input(s): TROPONINI in the last 168 hours. ------------------------------------------------------------------------------------------------------------------  RADIOLOGY:  No results found.    IMPRESSION AND PLAN:   Lower extremity cellulitis- failed outpatient management with Keflex and  Clindamycin.  Not meeting sepsis criteria on admission. -Will start vancomycin, as patient states this has always worked in the past -LLE doppler US to rule out DVT -Blood cultures ordered -Check CBC and BMP  Chronic bilateral lymphedema- severe -Supportive care  Hypertension- blood pressure is normal. -Continue home norvasc and atenolol  Asthma- stable.  No signs of acute exacerbation -Albuterol 2 puffs every 6 hours as needed  OSA-stable -CPAP nightly  All the records are reviewed and case discussed with ED provider. Management plans discussed with the patient, family and they are in agreement.  CODE STATUS: Full  TOTAL TIME TAKING CARE OF THIS PATIENT: 45 minutes.    Berna Spare Malikye Reppond M.D on 11/03/2018 at 4:50 PM  Between 7am to 6pm - Pager - [phone]  After 6pm go to [email or web address] - [employer] [place] [facility]  Office  [phone]  CC: Primary care physician; Center, [name]   Note: This dictation was prepared with Dragon dictation along with smaller phrase technology. Any transcriptional errors that result from this process are unintentional.

## 2018-11-04 ENCOUNTER — Inpatient Hospital Stay: Payer: Medicaid Other

## 2018-11-04 LAB — CBC
HCT: 34.7 % — ABNORMAL LOW (ref 36.0–46.0)
Hemoglobin: 10.5 g/dL — ABNORMAL LOW (ref 12.0–15.0)
MCH: 26.4 pg (ref 26.0–34.0)
MCHC: 30.3 g/dL (ref 30.0–36.0)
MCV: 87.2 fL (ref 80.0–100.0)
Platelets: 171 10*3/uL (ref 150–400)
RBC: 3.98 MIL/uL (ref 3.87–5.11)
RDW: 14.4 % (ref 11.5–15.5)
WBC: 8 10*3/uL (ref 4.0–10.5)
nRBC: 0 % (ref 0.0–0.2)

## 2018-11-04 LAB — BASIC METABOLIC PANEL
Anion gap: 6 (ref 5–15)
BUN: 20 mg/dL (ref 8–23)
CO2: 27 mmol/L (ref 22–32)
Calcium: 8.1 mg/dL — ABNORMAL LOW (ref 8.9–10.3)
Chloride: 106 mmol/L (ref 98–111)
Creatinine, Ser: 0.98 mg/dL (ref 0.44–1.00)
GFR calc Af Amer: >60 mL/min (ref >60)
GFR calc non Af Amer: >60 mL/min (ref >60)
Glucose, Bld: 102 mg/dL — ABNORMAL HIGH (ref 70–99)
Potassium: 4.2 mmol/L (ref 3.5–5.1)
Sodium: 139 mmol/L (ref 135–145)

## 2018-11-04 MED ORDER — TRAMADOL HCL 50 MG PO TABS: 50.0000 mg | ORAL_TABLET | Freq: Four times a day (QID) | ORAL | Status: DC | PRN

## 2018-11-04 NOTE — Progress Notes (Signed)
[place] at [place] NAME: [name]    MR#:  [id number]  DATE OF BIRTH:  1955/11/17  SUBJECTIVE:    REVIEW OF SYSTEMS:   ROS Tolerating Diet: Tolerating PT:   DRUG ALLERGIES:   Allergies  Allergen Reactions  . Ace Inhibitors Hives and Swelling  . Ibuprofen Hives and Swelling    VITALS:  Blood pressure (!) 103/56, pulse 65, temperature 98.7 F (37.1 C), temperature source Oral, resp. rate 18, height 5\' 3"  (1.6 m), weight (!) 189.2 kg, SpO2 96 %.  PHYSICAL EXAMINATION:  GENERAL:  63 y.o.-year-old patient lying in the bed with no acute distress.  EYES: Pupils equal, round, reactive to light and accommodation. No scleral icterus. Extraocular muscles intact.  HEENT: Head atraumatic, normocephalic. Oropharynx and nasopharynx clear.  NECK:  Supple, no jugular venous distention. No thyroid enlargement, no tenderness.  LUNGS: Normal breath sounds bilaterally, no wheezing, rales,rhonchi or crepitation. No use of accessory muscles of respiration.  CARDIOVASCULAR: RRR, S1, S2 normal. No murmurs, rubs, or gallops.  ABDOMEN: Soft, nontender, nondistended. Bowel sounds present. No organomegaly or mass.  EXTREMITIES: No cyanosis, or clubbing. + Severe chronic bilateral lymphedema.  Left lower extremity is erythematous and warm to the touch. + Weeping present.  See picture below. NEUROLOGIC: Cranial nerves II through XII are intact. Muscle strength 5/5 in all extremities. Sensation intact. Gait not checked.  PSYCHIATRIC:  patient is alert and oriented x 3.  SKIN: Left lower extremity erythema     LABORATORY PANEL:  CBC Recent Labs  Lab 11/04/18 0528  WBC 8.0  HGB 10.5*  HCT 34.7*  PLT 171    Chemistries  Recent Labs  Lab 11/04/18 0528  NA 139  K 4.2  CL 106  CO2 27  GLUCOSE 102*  BUN 20  CREATININE 0.98  CALCIUM 8.1*   Cardiac Enzymes No results for input(s): TROPONINI in the last 168 hours. RADIOLOGY:  No  results found. ASSESSMENT AND PLAN:   [name]  is a 63 y.o. female with a known history of severe bilateral chronic lymphedema, hypertension, asthma, OSA, morbid obesity who was directly admitted from [facility] with worsening left lower extremity cellulitis that has failed outpatient treatment  *Lower extremity cellulitis- failed outpatient management with Keflex and Clindamycin.  Not meeting sepsis criteria on admission. -Will start vancomycin, as patient states this has always worked in the past -LLE doppler US to rule out DVT -Blood cultures neg so far -WBC normal  *Chronic bilateral lymphedema- severe -Supportive care  *Hypertension- blood pressure is normal. -Continue home norvasc and atenolol  *Asthma- stable.  No signs of acute exacerbation -Albuterol 2 puffs every 6 hours as needed  *OSA-stable -CPAP nightly  Case discussed with Care Management/Social Worker. Management plans discussed with the patient, family and they are in agreement.  CODE STATUS: full code  DVT Prophylaxis: lovenox  TOTAL TIME TAKING CARE OF THIS PATIENT: *30* minutes.  >50% time spent on counselling and coordination of care  POSSIBLE D/C IN  1-2 DAYS, DEPENDING ON CLINICAL CONDITION.  Note: This dictation was prepared with Dragon dictation along with smaller phrase technology. Any transcriptional errors that result from this process are unintentional.  Fritzi Mandes M.D on 11/04/2018 at 8:34 AM  Between 7am to 6pm - Pager - [phone]  After 6pm go to [email or web address] - password [email or web address] [place] [facility]  Office  [phone]  CC: Primary care physician; [facility] HealthPatient ID:  [name], female   DOB: December 18, 1955, 63 y.o.   MRN: [id number]

## 2018-11-05 LAB — CREATININE, SERUM
Creatinine, Ser: 0.94 mg/dL (ref 0.44–1.00)
GFR calc Af Amer: >60 mL/min (ref >60)
GFR calc non Af Amer: >60 mL/min (ref >60)

## 2018-11-05 MED ORDER — CIPROFLOXACIN HCL 500 MG PO TABS: 500.0000 mg | ORAL_TABLET | Freq: Two times a day (BID) | ORAL | 0 refills | Status: AC

## 2018-11-05 MED ORDER — SULFAMETHOXAZOLE-TRIMETHOPRIM 800-160 MG PO TABS: 1.0000 | ORAL_TABLET | Freq: Two times a day (BID) | ORAL | 0 refills | Status: DC

## 2018-11-05 NOTE — Progress Notes (Signed)
MD ordered patient to be discharged home.  Discharge instructions were reviewed with the patient and she voiced understanding.  Follow-up appointment was made.  Prescriptions sent to the patients pharmacy.  IV was removed with catheter intact.  All patients questions were answered.  Patient left via wheelchair escorted by nursing.

## 2018-11-05 NOTE — Discharge Summary (Signed)
[place] at [place] NAME: [name]    MR#:  [id number]  DATE OF BIRTH:  11-13-1955  DATE OF ADMISSION:  11/03/2018 ADMITTING PHYSICIAN: Sela Hua, MD  DATE OF DISCHARGE: 11/05/2018  PRIMARY CARE PHYSICIAN: [facility], [place]    ADMISSION DIAGNOSIS:  Lt lower extremity cellulitis  DISCHARGE DIAGNOSIS:  Active Problems:   Cellulitis of left lower extremity   SECONDARY DIAGNOSIS:   Past Medical History:  Diagnosis Date  . Arthritis   . Asthma   . Enlarged heart   . Hypertension   . Lymphedema   . Sleep apnea     HOSPITAL COURSE:  63 y.o.femalewith a known history of severe bilateral chronic lymphedema, hypertension, asthma, OSA, morbid obesity who was directly admitted from [facility] with worsening left lower extremity cellulitis that has failed outpatient treatment.  1. Left LE cellulitis failed outpatient Clindamycin and Keflex: Patient was placed on IV vancomycin.  Her cellulitis is much improved.  She has underlying chronic venous stasis.  Patient reports that in the past vancomycin has helped and then p.o. Bactrim.  She will be discharged on oral Bactrim and ciprofloxacin.  Dopplers were negative for DVT.  She will have outpatient follow-up with her primary care physician.  She is asked to continue to elevate her leg on at least 2 pillows.  She will have wound care with Unna boots and silver alginate to her lower extremity as well.  2.  Chronic bilateral lymphedema: Continue wound care/Unna boots  3.  Essential hypertension: Continue Norvasc and atenolol  4.  Mild intermittent asthma without signs of exacerbation: Continue inhalers    DISCHARGE CONDITIONS AND DIET:   Stable for discharge on regular cardiac diet  CONSULTS OBTAINED:    DRUG ALLERGIES:   Allergies  Allergen Reactions  . Ace Inhibitors Hives and Swelling  . Ibuprofen Hives and Swelling    DISCHARGE  MEDICATIONS:   Allergies as of 11/05/2018      Reactions   Ace Inhibitors Hives, Swelling   Ibuprofen Hives, Swelling      Medication List    STOP taking these medications   clindamycin 300 MG capsule Commonly known as:  CLEOCIN     TAKE these medications   acetaminophen 500 MG tablet Commonly known as:  TYLENOL Take 1,000-1,500 mg by mouth every 6 (six) hours as needed for mild pain.   albuterol 108 (90 Base) MCG/ACT inhaler Commonly known as:  VENTOLIN HFA Inhale 2 puffs into the lungs every 6 (six) hours as needed for wheezing or shortness of breath.   amLODipine 5 MG tablet Commonly known as:  NORVASC Take 5 mg by mouth daily.   atenolol 50 MG tablet Commonly known as:  TENORMIN Take 50 mg by mouth daily.   cetirizine 10 MG tablet Commonly known as:  ZYRTEC Take 10 mg by mouth daily as needed for allergies.   ciprofloxacin 500 MG tablet Commonly known as:  Cipro Take 1 tablet (500 mg total) by mouth 2 (two) times daily for 10 days.   sulfamethoxazole-trimethoprim 800-160 MG tablet Commonly known as:  BACTRIM DS Take 1 tablet by mouth 2 (two) times daily.         Today   CHIEF COMPLAINT:  Patient reports his cellulitis is improved.   VITAL SIGNS:  Blood pressure 126/68, pulse 73, temperature 98.9 F (37.2 C), temperature source Oral, resp. rate 20, height 5\' 3"  (1.6 m), weight (!) 189.2 kg,  SpO2 98 %.   REVIEW OF SYSTEMS:  Review of Systems  Constitutional: Negative.  Negative for chills, fever and malaise/fatigue.  HENT: Negative.  Negative for ear discharge, ear pain, hearing loss, nosebleeds and sore throat.   Eyes: Negative.  Negative for blurred vision and pain.  Respiratory: Negative.  Negative for cough, hemoptysis, shortness of breath and wheezing.   Cardiovascular: Positive for leg swelling. Negative for chest pain and palpitations.  Gastrointestinal: Negative.  Negative for abdominal pain, blood in stool, diarrhea, nausea and vomiting.   Genitourinary: Negative.  Negative for dysuria.  Musculoskeletal: Negative.  Negative for back pain.  Skin: Negative.        Cellulitis improved   Neurological: Negative for dizziness, tremors, speech change, focal weakness, seizures and headaches.  Endo/Heme/Allergies: Negative.  Does not bruise/bleed easily.  Psychiatric/Behavioral: Negative.  Negative for depression, hallucinations and suicidal ideas.     PHYSICAL EXAMINATION:  GENERAL:  63 y.o.-year-old patient lying in the bed with no acute distress.  NECK:  Supple, no jugular venous distention. No thyroid enlargement, no tenderness.  LUNGS: Normal breath sounds bilaterally, no wheezing, rales,rhonchi  No use of accessory muscles of respiration.  CARDIOVASCULAR: S1, S2 normal. No murmurs, rubs, or gallops.  ABDOMEN: Soft, non-tender, non-distended. Bowel sounds present. No organomegaly or mass.  EXTREMITIES: No pedal edema, cyanosis, or clubbing.  PSYCHIATRIC: The patient is alert and oriented x 3.  SKIN: Chronic lymphedema bilateral.  Cellulitis is improved from markings.  DATA REVIEW:   CBC Recent Labs  Lab 11/04/18 0528  WBC 8.0  HGB 10.5*  HCT 34.7*  PLT 171    Chemistries  Recent Labs  Lab 11/04/18 0528 11/05/18 0324  NA 139  --   K 4.2  --   CL 106  --   CO2 27  --   GLUCOSE 102*  --   BUN 20  --   CREATININE 0.98 0.94  CALCIUM 8.1*  --     Cardiac Enzymes No results for input(s): TROPONINI in the last 168 hours.  Microbiology Results  @[email or web address] @  RADIOLOGY:  US Venous Img Lower Unilateral Left  Result Date: 11/04/2018 CLINICAL DATA:  Left lower extremity cellulitis and edema. EXAM: LEFT LOWER EXTREMITY VENOUS DOPPLER ULTRASOUND TECHNIQUE: Gray-scale sonography with graded compression, as well as color Doppler and duplex ultrasound were performed to evaluate the lower extremity deep venous systems from the level of the common femoral vein and including the common femoral, femoral, profunda  femoral, popliteal and calf veins including the posterior tibial, peroneal and gastrocnemius veins when visible. The superficial great saphenous vein was also interrogated. Spectral Doppler was utilized to evaluate flow at rest and with distal augmentation maneuvers in the common femoral, femoral and popliteal veins. COMPARISON:  09/28/2013 FINDINGS: Contralateral Common Femoral Vein: Respiratory phasicity is normal and symmetric with the symptomatic side. No evidence of thrombus. Normal compressibility. Common Femoral Vein: No evidence of thrombus. Normal compressibility, respiratory phasicity and response to augmentation. Saphenofemoral Junction: No evidence of thrombus. Normal compressibility and flow on color Doppler imaging. Profunda Femoral Vein: No evidence of thrombus. Normal compressibility and flow on color Doppler imaging. Femoral Vein: No evidence of thrombus. Normal compressibility, respiratory phasicity and response to augmentation. Popliteal Vein: No evidence of thrombus. Normal compressibility, respiratory phasicity and response to augmentation. Calf Veins: Very limited evaluation of calf veins due to body habitus. No obvious tibial vein thrombus. Superficial Great Saphenous Vein: No evidence of thrombus. Normal compressibility. Venous Reflux:  None. Other Findings: No evidence  of superficial thrombophlebitis or abnormal fluid collection. IMPRESSION: No evidence of left lower extremity deep venous thrombosis. Electronically Signed   By: Aletta Edouard M.D.   On: 11/04/2018 10:58      Allergies as of 11/05/2018      Reactions   Ace Inhibitors Hives, Swelling   Ibuprofen Hives, Swelling      Medication List    STOP taking these medications   clindamycin 300 MG capsule Commonly known as:  CLEOCIN     TAKE these medications   acetaminophen 500 MG tablet Commonly known as:  TYLENOL Take 1,000-1,500 mg by mouth every 6 (six) hours as needed for mild pain.   albuterol 108 (90 Base)  MCG/ACT inhaler Commonly known as:  VENTOLIN HFA Inhale 2 puffs into the lungs every 6 (six) hours as needed for wheezing or shortness of breath.   amLODipine 5 MG tablet Commonly known as:  NORVASC Take 5 mg by mouth daily.   atenolol 50 MG tablet Commonly known as:  TENORMIN Take 50 mg by mouth daily.   cetirizine 10 MG tablet Commonly known as:  ZYRTEC Take 10 mg by mouth daily as needed for allergies.   ciprofloxacin 500 MG tablet Commonly known as:  Cipro Take 1 tablet (500 mg total) by mouth 2 (two) times daily for 10 days.   sulfamethoxazole-trimethoprim 800-160 MG tablet Commonly known as:  BACTRIM DS Take 1 tablet by mouth 2 (two) times daily.           Management plans discussed with the patient and she is in agreement. Stable for discharge home with [facility]  Patient should follow up with pcp CODE STATUS:     Code Status Orders  (From admission, onward)         Start     Ordered   11/03/18 1716  Full code  Continuous     11/03/18 1715        Code Status History    Date Active Date Inactive Code Status Order ID Comments User Context   04/17/2018 0905 04/19/2018 1649 Full Code [id number]  Arta Silence, MD Inpatient   08/04/2016 0246 08/07/2016 1731 Full Code [id number]  Lance Coon, MD Inpatient   01/23/2015 2114 01/28/2015 1717 Full Code [id number]  Hower, Aaron Mose, MD ED      TOTAL TIME TAKING CARE OF THIS PATIENT: 38 minutes.    Note: This dictation was prepared with Dragon dictation along with smaller phrase technology. Any transcriptional errors that result from this process are unintentional.  Bettey Costa M.D on 11/05/2018 at 11:52 AM  Between 7am to 6pm - Pager - [phone] After 6pm go to [email or web address] - password [email or web address] [place] [facility]  Office  [phone]  CC: Primary care physician; Center, [facility]

## 2018-11-05 NOTE — TOC Transition Note (Signed)
Transition of Care [facility]) - CM/SW Discharge Note   Patient Details  Name: [name] MRN: [id number] Date of Birth: [birth year]  Transition of Care [facility]) CM/SW Contact:  Annamaria Boots, [place] Phone Number: 11/05/2018, 11:25 AM   Clinical Narrative:  Patient is medically ready for discharge today. Patient will need home health for wound care. CSW spoke with patient and she states that she has used [facility] in the past and would like to use them again. CSW notified Corene Cornea with [facility] of referral.      Final next level of care: [place] Barriers to Discharge: No Barriers Identified   Patient Goals and CMS Choice Patient states their goals for this hospitalization and ongoing recovery are:: I want to return home with home wound care  CMS Medicare.gov Compare Post Acute Care list provided to:: Patient Choice offered to / list presented to : Patient  Discharge Placement                       Discharge Plan and Services                          HH Arranged: RN, PT, Nurse's Aide [facility] Agency: [facility] ([place]) Date [place]: 11/05/18 Time [place]: 2010 Representative spoke with at [place]: [place] (SDOH) Interventions     Readmission Risk Interventions No flowsheet data found.

## 2018-11-08 LAB — CULTURE, BLOOD (ROUTINE X 2)
Culture: NO GROWTH
Culture: NO GROWTH
Special Requests: ADEQUATE
Special Requests: ADEQUATE

## 2019-02-20 ENCOUNTER — Encounter: Payer: Medicaid Other | Attending: Physician Assistant | Admitting: Physician Assistant

## 2019-02-20 ENCOUNTER — Other Ambulatory Visit: Payer: Self-pay

## 2019-02-20 DIAGNOSIS — L97812 Non-pressure chronic ulcer of other part of right lower leg with fat layer exposed: Secondary | ICD-10-CM | POA: Diagnosis not present

## 2019-02-20 DIAGNOSIS — M1990 Unspecified osteoarthritis, unspecified site: Secondary | ICD-10-CM | POA: Insufficient documentation

## 2019-02-20 DIAGNOSIS — E1151 Type 2 diabetes mellitus with diabetic peripheral angiopathy without gangrene: Secondary | ICD-10-CM | POA: Diagnosis not present

## 2019-02-20 DIAGNOSIS — E1136 Type 2 diabetes mellitus with diabetic cataract: Secondary | ICD-10-CM | POA: Insufficient documentation

## 2019-02-20 DIAGNOSIS — Z87891 Personal history of nicotine dependence: Secondary | ICD-10-CM | POA: Diagnosis not present

## 2019-02-20 DIAGNOSIS — J45909 Unspecified asthma, uncomplicated: Secondary | ICD-10-CM | POA: Insufficient documentation

## 2019-02-20 DIAGNOSIS — M109 Gout, unspecified: Secondary | ICD-10-CM | POA: Insufficient documentation

## 2019-02-20 DIAGNOSIS — I10 Essential (primary) hypertension: Secondary | ICD-10-CM | POA: Insufficient documentation

## 2019-02-20 DIAGNOSIS — I890 Lymphedema, not elsewhere classified: Secondary | ICD-10-CM | POA: Diagnosis present

## 2019-02-20 DIAGNOSIS — L97822 Non-pressure chronic ulcer of other part of left lower leg with fat layer exposed: Secondary | ICD-10-CM | POA: Insufficient documentation

## 2019-02-20 NOTE — Progress Notes (Signed)
TIARI, ANDRINGA ([id number]) Visit Report for 02/20/2019 Allergy List Details Patient Name: [name], [name]. Date of Service: 02/20/2019 1:15 PM Medical Record Number: [id number] Patient Account Number: [id number] Date of Birth/Sex: 1956-04-07 (63 y.o. F) Treating RN: Army Melia Primary Care Alleyne Lac: Tandy Gaw Other Clinician: Referring Mckaila Duffus: Tandy Gaw Treating Samanta Gal/Extender: STONE III, HOYT Weeks in Treatment: 0 Allergies Active Allergies ibuprofen Reaction: hives, swells ACE Inhibitors Allergy Notes Electronic Signature(s) Signed: 02/20/2019 3:55:59 PM By: Army Melia Entered By: Army Melia on 02/20/2019 13:05:09 [place], [name] ([id number]) -------------------------------------------------------------------------------- Arrival Information Details Patient Name: [name]. Date of Service: 02/20/2019 1:15 PM Medical Record Number: [id number] Patient Account Number: [id number] Date of Birth/Sex: 1955/11/30 (63 y.o. F) Treating RN: Army Melia Primary Care Maquita Sandoval: Tandy Gaw Other Clinician: Referring Okla Qazi: Tandy Gaw Treating Farrie Sann/Extender: STONE III, HOYT Weeks in Treatment: 0 Visit Information Patient Arrived: Cane Arrival Time: 13:01 Accompanied By: self Transfer Assistance: None History Since Last Visit Added or deleted any medications: No Any new allergies or adverse reactions: No Had a fall or experienced change in activities of daily living that may affect risk of falls: No Signs or symptoms of abuse/neglect since last visito No Hospitalized since last visit: No Has Dressing in Place as Prescribed: Yes Electronic Signature(s) Signed: 02/20/2019 3:55:59 PM By: Army Melia Entered By: Army Melia on 02/20/2019 13:03:32 Blitch, [name] ([id number]) -------------------------------------------------------------------------------- Clinic Level of Care Assessment Details Patient Name: [name]. Date of Service:  02/20/2019 1:15 PM Medical Record Number: [id number] Patient Account Number: [id number] Date of Birth/Sex: 02-19-1956 (63 y.o. F) Treating RN: Montey Hora Primary Care Naiyana Barbian: Tandy Gaw Other Clinician: Referring Jasenia Weilbacher: Tandy Gaw Treating Gershom Brobeck/Extender: STONE III, HOYT Weeks in Treatment: 0 Clinic Level of Care Assessment Items TOOL 2 Quantity Score []  - Use when only an EandM is performed on the INITIAL visit 0 ASSESSMENTS - Nursing Assessment / Reassessment X - General Physical Exam (combine w/ comprehensive assessment (listed just below) when 1 20 performed on new pt. evals) X- 1 25 Comprehensive Assessment (HX, ROS, Risk Assessments, Wounds Hx, etc.) ASSESSMENTS - Wound and Skin Assessment / Reassessment []  - Simple Wound Assessment / Reassessment - one wound 0 X- 2 5 Complex Wound Assessment / Reassessment - multiple wounds []  - 0 Dermatologic / Skin Assessment (not related to wound area) ASSESSMENTS - Ostomy and/or Continence Assessment and Care []  - Incontinence Assessment and Management 0 []  - 0 Ostomy Care Assessment and Management (repouching, etc.) PROCESS - Coordination of Care X - Simple Patient / Family Education for ongoing care 1 15 []  - 0 Complex (extensive) Patient / Family Education for ongoing care X- 1 10 Staff obtains Programmer, systems, Records, Test Results / Process Orders []  - 0 Staff telephones HHA, Nursing Homes / Clarify orders / etc []  - 0 Routine Transfer to another Facility (non-emergent condition) []  - 0 Routine Hospital Admission (non-emergent condition) X- 1 15 New Admissions / Biomedical engineer / Ordering NPWT, Apligraf, etc. []  - 0 Emergency Hospital Admission (emergent condition) X- 1 10 Simple Discharge Coordination []  - 0 Complex (extensive) Discharge Coordination PROCESS - Special Needs []  - Pediatric / Minor Patient Management 0 []  - 0 Isolation Patient Management Elizabeth, [name]. ([id number]) []  - 0 Hearing  / Language / Visual special needs []  - 0 Assessment of Community assistance (transportation, D/C planning, etc.) []  - 0 Additional assistance / Altered mentation []  - 0 Support Surface(s) Assessment (bed, cushion, seat, etc.) INTERVENTIONS - Wound Cleansing / Measurement X - Wound  Imaging (photographs - any number of wounds) 1 5 []  - 0 Wound Tracing (instead of photographs) []  - 0 Simple Wound Measurement - one wound X- 2 5 Complex Wound Measurement - multiple wounds []  - 0 Simple Wound Cleansing - one wound X- 2 5 Complex Wound Cleansing - multiple wounds INTERVENTIONS - Wound Dressings []  - Small Wound Dressing one or multiple wounds 0 X- 2 15 Medium Wound Dressing one or multiple wounds []  - 0 Large Wound Dressing one or multiple wounds []  - 0 Application of Medications - injection INTERVENTIONS - Miscellaneous []  - External ear exam 0 []  - 0 Specimen Collection (cultures, biopsies, blood, body fluids, etc.) []  - 0 Specimen(s) / Culture(s) sent or taken to Lab for analysis []  - 0 Patient Transfer (multiple staff / Civil Service fast streamer / Similar devices) []  - 0 Simple Staple / Suture removal (25 or less) []  - 0 Complex Staple / Suture removal (26 or more) []  - 0 Hypo / Hyperglycemic Management (close monitor of Blood Glucose) []  - 0 Ankle / Brachial Index (ABI) - do not check if billed separately Has the patient been seen at the hospital within the last three years: Yes Total Score: 160 Level Of Care: New/Established - Level 5 Electronic Signature(s) Signed: 02/20/2019 4:41:14 PM By: Montey Hora Entered By: Montey Hora on 02/20/2019 13:59:46 Crickenberger, [name] ([id number]) -------------------------------------------------------------------------------- Lower Extremity Assessment Details Patient Name: [name]. Date of Service: 02/20/2019 1:15 PM Medical Record Number: [id number] Patient Account Number: [id number] Date of Birth/Sex: 10/18/1955 (63 y.o.  F) Treating RN: Army Melia Primary Care Jonus Coble: Tandy Gaw Other Clinician: Referring Lindel Marcell: Tandy Gaw Treating Lijah Bourque/Extender: STONE III, HOYT Weeks in Treatment: 0 Edema Assessment Assessed: [Left: No] [Right: No] Edema: [Left: Yes] [Right: Yes] Calf Left: Right: Point of Measurement: 43 cm From Medial Instep 98 cm 92 cm Ankle Left: Right: Point of Measurement: 9 cm From Medial Instep 51 cm 61 cm Vascular Assessment Pulses: Dorsalis Pedis Palpable: [Left:Yes] [Right:Yes] Electronic Signature(s) Signed: 02/20/2019 3:55:59 PM By: Army Melia Entered By: Army Melia on 02/20/2019 13:15:36 Orozco, [name] ([id number]) -------------------------------------------------------------------------------- Multi Wound Chart Details Patient Name: [name]. Date of Service: 02/20/2019 1:15 PM Medical Record Number: [id number] Patient Account Number: [id number] Date of Birth/Sex: 09-Sep-1955 (63 y.o. F) Treating RN: Montey Hora Primary Care Olia Hinderliter: Tandy Gaw Other Clinician: Referring Ermalee Mealy: Tandy Gaw Treating Albert Devaul/Extender: STONE III, HOYT Weeks in Treatment: 0 Vital Signs Height(in): 62 Pulse(bpm): 82 Weight(lbs): 390 Blood Pressure(mmHg): 137/78 Body Mass Index(BMI): 71 Temperature(F): 98.2 Respiratory Rate 16 (breaths/min): Photos: [N/A:N/A] Wound Location: Left Lower Leg - Medial N/A N/A Wounding Event: Blister N/A N/A Primary Etiology: Lymphedema N/A N/A Comorbid History: Cataracts, Lymphedema, N/A N/A Asthma, Sleep Apnea, Tuberculosis, Hypertension, Gout, Osteoarthritis Date Acquired: 10/08/2018 N/A N/A Weeks of Treatment: 0 N/A N/A Wound Status: Open N/A N/A Measurements L x W x D 6.3x6x0.1 N/A N/A (cm) Area (cm) : 29.688 N/A N/A Volume (cm) : 2.969 N/A N/A Classification: Partial Thickness N/A N/A Exudate Amount: Medium N/A N/A Exudate Type: Serous N/A N/A Exudate Color: amber N/A N/A Granulation Amount: Medium  (34-66%) N/A N/A Granulation Quality: Red N/A N/A Necrotic Amount: Medium (34-66%) N/A N/A Exposed Structures: Fat Layer (Subcutaneous N/A N/A Tissue) Exposed: Yes Fascia: No Tendon: No Muscle: No Joint: No Bone: No Epithelialization: None N/A N/A RAYLINN, KOSAR ([id number]) Treatment Notes Electronic Signature(s) Signed: 02/20/2019 4:41:14 PM By: Montey Hora Entered By: Montey Hora on 02/20/2019 13:31:12 [name] ([id number]) -------------------------------------------------------------------------------- [place]  Details Patient Name: JEANET, LUPE. Date of Service: 02/20/2019 1:15 PM Medical Record Number: [id number] Patient Account Number: [id number] Date of Birth/Sex: Apr 29, 1956 (63 y.o. F) Treating RN: Montey Hora Primary Care Maysoon Lozada: Tandy Gaw Other Clinician: Referring Wenzel Backlund: Tandy Gaw Treating Cristhian Vanhook/Extender: STONE III, HOYT Weeks in Treatment: 0 Active Inactive Abuse / Safety / Falls / Self Care Management Nursing Diagnoses: Potential for falls Goals: Patient will remain injury free related to falls Date Initiated: 02/20/2019 Target Resolution Date: 05/16/2019 Goal Status: Active Interventions: Assess fall risk on admission and as needed Notes: Orientation to the Wound Care Program Nursing Diagnoses: Knowledge deficit related to the wound healing center program Goals: Patient/caregiver will verbalize understanding of the [facility] Program Date Initiated: 02/20/2019 Target Resolution Date: 05/16/2019 Goal Status: Active Interventions: Provide education on orientation to the wound center Notes: Venous Leg Ulcer Nursing Diagnoses: Actual venous Insuffiency (use after diagnosis is confirmed) Goals: Patient will maintain optimal edema control Date Initiated: 02/20/2019 Target Resolution Date: 05/16/2019 Goal Status: Active Interventions: Assess peripheral edema status every visit. LYNNITA, SOMMA ([id number]) Compression as ordered Notes: Wound/Skin Impairment Nursing Diagnoses: Impaired tissue integrity Goals: Ulcer/skin breakdown will heal within 14 weeks Date Initiated: 02/20/2019 Target Resolution Date: 05/16/2019 Goal Status: Active Interventions: Assess patient/caregiver ability to obtain necessary supplies Assess patient/caregiver ability to perform ulcer/skin care regimen upon admission and as needed Assess ulceration(s) every visit Notes: Electronic Signature(s) Signed: 02/20/2019 4:41:14 PM By: Montey Hora Entered By: Montey Hora on 02/20/2019 13:30:49 Pounds, [name] ([id number]) -------------------------------------------------------------------------------- Pain Assessment Details Patient Name: [name]. Date of Service: 02/20/2019 1:15 PM Medical Record Number: [id number] Patient Account Number: [id number] Date of Birth/Sex: [birth year] (63 y.o. F) Treating RN: Army Melia Primary Care Dianne Whelchel: Tandy Gaw Other Clinician: Referring Maddie Brazier: Tandy Gaw Treating Saylor Sheckler/Extender: STONE III, HOYT Weeks in Treatment: 0 Active Problems Location of Pain Severity and Description of Pain Patient Has Paino No Site Locations Pain Management and Medication Current Pain Management: Electronic Signature(s) Signed: 02/20/2019 3:55:59 PM By: Army Melia Entered By: Army Melia on 02/20/2019 13:03:41 Myren, [name] ([id number]) -------------------------------------------------------------------------------- Patient/Caregiver Education Details Patient Name: [name]. Date of Service: 02/20/2019 1:15 PM Medical Record Number: [id number] Patient Account Number: [id number] Date of Birth/Gender: 11-11-1955 (63 y.o. F) Treating RN: Montey Hora Primary Care Physician: Tandy Gaw Other Clinician: Referring Physician: Tandy Gaw Treating Physician/Extender: Melburn Hake, HOYT Weeks in Treatment: 0 Education Assessment Education  Provided To: Patient Education Topics Provided Venous: Handouts: Other: use pumps Methods: Explain/Verbal Responses: State content correctly Wound/Skin Impairment: Handouts: Other: wound care as ordered Methods: Explain/Verbal Responses: State content correctly Electronic Signature(s) Signed: 02/20/2019 4:41:14 PM By: Montey Hora Entered By: Montey Hora on 02/20/2019 [place], [name] ([id number]) -------------------------------------------------------------------------------- Wound Assessment Details Patient Name: [name] Date of Service: 02/20/2019 1:15 PM Medical Record Number: [id number] Patient Account Number: [id number] Date of Birth/Sex: 1955/10/19 (63 y.o. F) Treating RN: Army Melia Primary Care Hiyab Nhem: Tandy Gaw Other Clinician: Referring Adalene Gulotta: Tandy Gaw Treating Winda Summerall/Extender: STONE III, HOYT Weeks in Treatment: 0 Wound Status Wound Number: 3 Primary Lymphedema Etiology: Wound Location: Left Lower Leg - Medial Wound Open Wounding Event: Blister Status: Date Acquired: 10/08/2018 Comorbid Cataracts, Lymphedema, Asthma, Sleep Weeks Of Treatment: 0 History: Apnea, Tuberculosis, Hypertension, Gout, Clustered Wound: No Osteoarthritis Photos Wound Measurements Length: (cm) 6.3 Width: (cm) 6 Depth: (cm) 0.1 Area: (cm) 29.688 Volume: (cm) 2.969 % Reduction in Area: 0% % Reduction in Volume: 0% Epithelialization: None Tunneling: No Undermining: No Wound Description Full  Thickness Without Exposed Support Foul Classification: Structures Sloug Exudate Medium Amount: Exudate Type: Serous Exudate Color: amber Odor After Cleansing: No h/Fibrino Yes Wound Bed Granulation Amount: Medium (34-66%) Exposed Structure Granulation Quality: Red Fascia Exposed: No Necrotic Amount: Medium (34-66%) Fat Layer (Subcutaneous Tissue) Exposed: Yes Necrotic Quality: Adherent Slough Tendon Exposed: No Muscle Exposed: No Joint  Exposed: No Bone Exposed: No Bublitz, Emylee C. ([id number]) Treatment Notes Wound #3 (Left, Medial Lower Leg) Notes nystatin powder, silvercel, abd, kerlix and conform Electronic Signature(s) Signed: 02/20/2019 3:55:59 PM By: Army Melia Signed: 02/20/2019 4:41:14 PM By: Montey Hora Entered By: Montey Hora on 02/20/2019 13:41:34 Czaja, [name] ([id number]) -------------------------------------------------------------------------------- Wound Assessment Details Patient Name: [name] Date of Service: 02/20/2019 1:15 PM Medical Record Number: [id number] Patient Account Number: [id number] Date of Birth/Sex: [birth year] (63 y.o. F) Treating RN: Montey Hora Primary Care Abhijot Straughter: Tandy Gaw Other Clinician: Referring Daielle Melcher: Tandy Gaw Treating Estera Ozier/Extender: STONE III, HOYT Weeks in Treatment: 0 Wound Status Wound Number: 4 Primary Lymphedema Etiology: Wound Location: Right Lower Leg - Medial Wound Open Wounding Event: Gradually Appeared Status: Date Acquired: 02/02/2019 Comorbid Cataracts, Lymphedema, Asthma, Sleep Weeks Of Treatment: 0 History: Apnea, Tuberculosis, Hypertension, Gout, Clustered Wound: No Osteoarthritis Photos Wound Measurements Length: (cm) 4 Width: (cm) 12 Depth: (cm) 0.1 Area: (cm) 37.699 Volume: (cm) 3.77 % Reduction in Area: % Reduction in Volume: Epithelialization: Medium (34-66%) Tunneling: No Undermining: No Wound Description Full Thickness Without Exposed Support Foul Odo Classification: Structures Slough/F Wound Margin: Indistinct, nonvisible Exudate Large Amount: Exudate Type: Serous Exudate Color: amber r After Cleansing: No ibrino Yes Wound Bed Granulation Amount: Medium (34-66%) Exposed Structure Granulation Quality: Pink Fascia Exposed: No Necrotic Amount: Medium (34-66%) Fat Layer (Subcutaneous Tissue) Exposed: Yes Necrotic Quality: Adherent Slough Tendon Exposed: No Muscle Exposed: No Joint  Exposed: No Bone Exposed: No Cryer, Samaya C. ([id number]) Treatment Notes Wound #4 (Right, Medial Lower Leg) Notes nystatin powder, silvercel, abd, kerlix and conform Electronic Signature(s) Signed: 02/20/2019 4:41:14 PM By: Montey Hora Entered By: Montey Hora on 02/20/2019 [place], [name] ([id number]) -------------------------------------------------------------------------------- [place] Details Patient Name: [name]. Date of Service: 02/20/2019 1:15 PM Medical Record Number: [id number] Patient Account Number: [id number] Date of Birth/Sex: 1955-08-04 (63 y.o. F) Treating RN: Army Melia Primary Care Isadora Delorey: Tandy Gaw Other Clinician: Referring Haelee Bolen: Tandy Gaw Treating Shunte Senseney/Extender: STONE III, HOYT Weeks in Treatment: 0 Vital Signs Time Taken: 13:03 Temperature (F): 98.2 Height (in): 62 Pulse (bpm): 82 Source: Stated Respiratory Rate (breaths/min): 16 Weight (lbs): 390 Blood Pressure (mmHg): 137/78 Source: Stated Reference Range: 80 - 120 mg / dl Body Mass Index (BMI): 71.3 Electronic Signature(s) Signed: 02/20/2019 3:55:59 PM By: Army Melia Entered By: Army Melia on 02/20/2019 13:04:48

## 2019-02-20 NOTE — Progress Notes (Signed)
WYONA, NEILS ([id number]) Visit Report for 02/20/2019 Abuse/Suicide Risk Screen Details Patient Name: [name], [name]. Date of Service: 02/20/2019 1:15 PM Medical Record Number: [id number] Patient Account Number: [id number] Date of Birth/Sex: 1955/11/12 (63 y.o. F) Treating RN: Army Melia Primary Care Lakeisa Heninger: Tandy Gaw Other Clinician: Referring Olan Kurek: Tandy Gaw Treating Kerrin Markman/Extender: STONE III, HOYT Weeks in Treatment: 0 Abuse/Suicide Risk Screen Items Answer ABUSE RISK SCREEN: Has anyone close to you tried to hurt or harm you recentlyo No Do you feel uncomfortable with anyone in your familyo No Has anyone forced you do things that you didnot want to doo No Electronic Signature(s) Signed: 02/20/2019 3:55:59 PM By: Army Melia Entered By: Army Melia on 02/20/2019 [place], [name] ([id number]) -------------------------------------------------------------------------------- Activities of Daily Living Details Patient Name: AMALYA, SALMONS C. Date of Service: 02/20/2019 1:15 PM Medical Record Number: [id number] Patient Account Number: [id number] Date of Birth/Sex: 10-Sep-1955 (63 y.o. F) Treating RN: Army Melia Primary Care Yuval Nolet: Tandy Gaw Other Clinician: Referring Lakeyia Surber: Tandy Gaw Treating Lenton Gendreau/Extender: STONE III, HOYT Weeks in Treatment: 0 Activities of Daily Living Items Answer Activities of Daily Living (Please select one for each item) Drive Automobile Not Able Take Medications Completely Able Use Telephone Completely Able Care for Appearance Completely Able Use Toilet Completely Able Bath / Shower Completely Able Dress Self Completely Able Feed Self Completely Able Walk Completely Able Get In / Out Bed Completely Able Housework Completely Able Prepare Meals Completely Able Handle Money Completely Able Shop for Self Completely Able Electronic Signature(s) Signed: 02/20/2019 3:55:59 PM By: Army Melia Entered By:  Army Melia on 02/20/2019 13:08:30 [place], [name] ([id number]) -------------------------------------------------------------------------------- Education Screening Details Patient Name: [name]. Date of Service: 02/20/2019 1:15 PM Medical Record Number: [id number] Patient Account Number: [id number] Date of Birth/Sex: [birth year] (63 y.o. F) Treating RN: Army Melia Primary Care Yavonne Kiss: Tandy Gaw Other Clinician: Referring Paolo Okane: Tandy Gaw Treating Chamika Cunanan/Extender: Melburn Hake, HOYT Weeks in Treatment: 0 Primary Learner Assessed: Patient Learning Preferences/Education Level/Primary Language Learning Preference: Explanation Highest Education Level: College or Above Preferred Language: English Cognitive Barrier Language Barrier: No Translator Needed: No Memory Deficit: No Emotional Barrier: No Cultural/Religious Beliefs Affecting Medical Care: No Physical Barrier Impaired Vision: No Impaired Hearing: No Decreased Hand dexterity: No Knowledge/Comprehension Knowledge Level: High Comprehension Level: High Ability to understand written High instructions: Ability to understand verbal High instructions: Motivation Anxiety Level: Calm Cooperation: Cooperative Education Importance: Acknowledges Need Interest in Health Problems: Asks Questions Perception: Coherent Willingness to Engage in Self- High Management Activities: Readiness to Engage in Self- High Management Activities: Electronic Signature(s) Signed: 02/20/2019 3:55:59 PM By: Army Melia Entered By: Army Melia on 02/20/2019 13:09:01 [name] ([id number]) -------------------------------------------------------------------------------- Fall Risk Assessment Details Patient Name: [name]. Date of Service: 02/20/2019 1:15 PM Medical Record Number: [id number] Patient Account Number: [id number] Date of Birth/Sex: 01/08/1956 (63 y.o. F) Treating RN: Army Melia Primary Care  Alexah Kivett: Tandy Gaw Other Clinician: Referring Zelie Asbill: Tandy Gaw Treating Jordyne Poehlman/Extender: STONE III, HOYT Weeks in Treatment: 0 Fall Risk Assessment Items Have you had 2 or more falls in the last 12 monthso 0 No Have you had any fall that resulted in injury in the last 12 monthso 0 No FALLS RISK SCREEN History of falling - immediate or within 3 months 0 No Secondary diagnosis (Do you have 2 or more medical diagnoseso) 0 No Ambulatory aid None/bed rest/wheelchair/nurse 0 No Crutches/cane/walker 15 Yes Furniture 0 No Intravenous therapy Access/Saline/Heparin Lock 0 No Gait/Transferring Normal/ bed rest/ wheelchair 0 No Weak (  short steps with or without shuffle, stooped but able to lift head while 10 Yes walking, may seek support from furniture) Impaired (short steps with shuffle, may have difficulty arising from chair, head 0 No down, impaired balance) Mental Status Oriented to own ability 0 No Electronic Signature(s) Signed: 02/20/2019 3:55:59 PM By: Army Melia Entered By: Army Melia on 02/20/2019 13:09:23 Fujita, [name] ([id number]) -------------------------------------------------------------------------------- Foot Assessment Details Patient Name: Elizabeth Landau C. Date of Service: 02/20/2019 1:15 PM Medical Record Number: [id number] Patient Account Number: [id number] Date of Birth/Sex: Aug 26, 1955 (63 y.o. F) Treating RN: Army Melia Primary Care Latorie Montesano: Tandy Gaw Other Clinician: Referring Gaberial Cada: Tandy Gaw Treating Devante Capano/Extender: STONE III, HOYT Weeks in Treatment: 0 Foot Assessment Items Site Locations + = Sensation present, - = Sensation absent, C = Callus, U = Ulcer R = Redness, W = Warmth, M = Maceration, PU = Pre-ulcerative lesion F = Fissure, S = Swelling, D = Dryness Assessment Right: Left: Other Deformity: No No Prior Foot Ulcer: No No Prior Amputation: No No Charcot Joint: No No Ambulatory Status: Ambulatory With  Help Assistance Device: Cane Gait: Steady Electronic Signature(s) Signed: 02/20/2019 3:55:59 PM By: Army Melia Entered By: Army Melia on 02/20/2019 13:16:26 Reichard, [name] ([id number]) -------------------------------------------------------------------------------- Nutrition Risk Screening Details Patient Name: Elizabeth Landau C. Date of Service: 02/20/2019 1:15 PM Medical Record Number: [id number] Patient Account Number: [id number] Date of Birth/Sex: [birth year] (63 y.o. F) Treating RN: Army Melia Primary Care Lashae Wollenberg: Tandy Gaw Other Clinician: Referring Renardo Cheatum: Tandy Gaw Treating Vidya Bamford/Extender: STONE III, HOYT Weeks in Treatment: 0 Height (in): 62 Weight (lbs): 390 Body Mass Index (BMI): 71.3 Nutrition Risk Screening Items Score Screening NUTRITION RISK SCREEN: I have an illness or condition that made me change the kind and/or amount of 0 No food I eat I eat fewer than two meals per day 0 No I eat few fruits and vegetables, or milk products 0 No I have three or more drinks of beer, liquor or wine almost every day 0 No I have tooth or mouth problems that make it hard for me to eat 0 No I don't always have enough money to buy the food I need 0 No I eat alone most of the time 0 No I take three or more different prescribed or over-the-counter drugs a day 0 No Without wanting to, I have lost or gained 10 pounds in the last six months 0 No I am not always physically able to shop, cook and/or feed myself 0 No Nutrition Protocols Good Risk Protocol 0 No interventions needed Moderate Risk Protocol High Risk Proctocol Risk Level: Good Risk Score: 0 Electronic Signature(s) Signed: 02/20/2019 3:55:59 PM By: Army Melia Entered By: Army Melia on 02/20/2019 13:09:28

## 2019-02-21 NOTE — Progress Notes (Signed)
NOHEA, KRAS ([id number]) Visit Report for 02/20/2019 Chief Complaint Document Details Patient Name: [name], [name]. Date of Service: 02/20/2019 1:15 PM Medical Record Number: [id number] Patient Account Number: [id number] Date of Birth/Sex: [birth year] (63 y.o. F) Treating RN: Montey Hora Primary Care Provider: Tandy Gaw Other Clinician: Referring Provider: Tandy Gaw Treating Provider/Extender: Melburn Hake, HOYT Weeks in Treatment: 0 Information Obtained from: Patient Chief Complaint Left LE Ulcer Electronic Signature(s) Signed: 02/20/2019 1:26:23 PM By: Worthy Keeler PA-C Entered By: Worthy Keeler on 02/20/2019 13:26:23 [name], [name] ([id number]) -------------------------------------------------------------------------------- Debridement Details Patient Name: [name]. Date of Service: 02/20/2019 1:15 PM Medical Record Number: [id number] Patient Account Number: [id number] Date of Birth/Sex: 1956/07/01 (63 y.o. F) Treating RN: Montey Hora Primary Care Provider: Tandy Gaw Other Clinician: Referring Provider: Tandy Gaw Treating Provider/Extender: STONE III, HOYT Weeks in Treatment: 0 Debridement Performed for Wound #3 Left,Medial Lower Leg Assessment: Performed By: Physician STONE III, HOYT E., PA-C Debridement Type: Chemical/Enzymatic/Mechanical Agent Used: normal saline Level of Consciousness (Pre- Awake and Alert procedure): Pre-procedure Verification/Time Yes - 13:45 Out Taken: Start Time: 13:45 Pain Control: Lidocaine 4% Topical Solution Instrument: Other : gauze Bleeding: None End Time: 13:46 Procedural Pain: 0 Post Procedural Pain: 0 Response to Treatment: Procedure was tolerated well Level of Consciousness Awake and Alert (Post-procedure): Post Debridement Measurements of Total Wound Length: (cm) 6.3 Width: (cm) 6 Depth: (cm) 0.1 Volume: (cm) 2.969 Character of Wound/Ulcer Post Debridement: Improved Post Procedure  Diagnosis Same as Pre-procedure Electronic Signature(s) Signed: 02/20/2019 2:08:04 PM By: Montey Hora Signed: 02/21/2019 2:28:02 AM By: Worthy Keeler PA-C Entered By: Montey Hora on 02/20/2019 [place], [name] ([id number]) -------------------------------------------------------------------------------- Debridement Details Patient Name: [name] C. Date of Service: 02/20/2019 1:15 PM Medical Record Number: [id number] Patient Account Number: [id number] Date of Birth/Sex: 02-13-1956 (63 y.o. F) Treating RN: Montey Hora Primary Care Provider: Tandy Gaw Other Clinician: Referring Provider: Tandy Gaw Treating Provider/Extender: STONE III, HOYT Weeks in Treatment: 0 Debridement Performed for Wound #4 Right,Medial Lower Leg Assessment: Performed By: Physician STONE III, HOYT E., PA-C Debridement Type: Chemical/Enzymatic/Mechanical Agent Used: normal saline Level of Consciousness (Pre- Awake and Alert procedure): Pre-procedure Verification/Time Yes - 13:46 Out Taken: Start Time: 13:46 Pain Control: Lidocaine 4% Topical Solution Instrument: Other : gauze Bleeding: None End Time: 13:47 Procedural Pain: 0 Post Procedural Pain: 0 Response to Treatment: Procedure was tolerated well Level of Consciousness Awake and Alert (Post-procedure): Post Debridement Measurements of Total Wound Length: (cm) 4 Width: (cm) 12 Depth: (cm) 0.1 Volume: (cm) 3.77 Character of Wound/Ulcer Post Debridement: Improved Post Procedure Diagnosis Same as Pre-procedure Electronic Signature(s) Signed: 02/20/2019 2:08:35 PM By: Montey Hora Signed: 02/21/2019 2:28:02 AM By: Worthy Keeler PA-C Entered By: Montey Hora on 02/20/2019 14:08:35 [name], [name] ([id number]) -------------------------------------------------------------------------------- HPI Details Patient Name: [name]. Date of Service: 02/20/2019 1:15 PM Medical Record Number: [id number] Patient  Account Number: [id number] Date of Birth/Sex: [birth year] (63 y.o. F) Treating RN: Montey Hora Primary Care Provider: Tandy Gaw Other Clinician: Referring Provider: Tandy Gaw Treating Provider/Extender: STONE III, HOYT Weeks in Treatment: 0 History of Present Illness HPI Description: The patient is a 63 year old female with history of hypertension and a long-standing history of bilateral lower extremity lymphedema (first presented on 4/2) . She has had open ulcers in the past which have always responded to compression therapy. She had briefly been to a lymphedema clinic in the past which helped her at the time. this time around she stopped treatment of her lymphedema  pumps approximately 2 weeks ago because of some pain in the knees and then noticed the right leg getting worse. She was seen by her PCP who put her on clindamycin 4 times a day 2 days ago. The patient has seen [facility] and Dr. Delana Meyer had seen her last year where a vascular study including venous and arterial duplex studies were within normal limits. he had recommended compression stockings and lymphedema pumps and the patient has been using this in about 2 weeks ago. She is known to be diabetic but in the past few time she's gone to her primary care doctor her hemoglobin A1c has been normal. 02/11/2015 - after her last visit she took my advice and went to the ER regarding the progressive cellulitis of her right lower extremity and she was admitted between July 17 and 22nd. She received IV antibiotics and then was sent home on a course of steroid-induced and oral antibiotics. She has improved much since then. 02/17/2015 -- she has been doing fine and the weeping of her legs has remarkably gone down. She has no fresh issues. READMISSION 01/15/18 This patient was given this clinic before most recently in 2016 seen by Dr. Con Memos. She has massive bilateral lymphedema and over the last 2 months this had weeping edema out of the  left leg. She has compression pumps but her compliance with these has been minimal. She has [facility] they've been using TCA/ABDs/kerlix under an Ace wrap.she has had recent problems with cellulitis. She was apparently seen in the ER and 12/23/17 and given clindamycin. She was then followed by her primary doctor and given doxycycline and Keflex. The pain seems to have settled down. In April 2018 the patient had arterial studies done at [facility]. This showed triphasic waveforms throughout the right leg and mostly triphasic waveforms on the left except for monophasic at the posterior tibial artery distally. She was not felt to have evidence of right lower extremity arterial stenosis or significant problems on the left side. 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 She also had a right lower extremity venous Doppler in January 2018 this was limited by the patient's body habitus and lymphedema. Most of the proximal veins were not visualized The patient presents with an area of denuded skin on the anterior medial part of the left calf. There is weeping edema fluid here. 01/22/18; the patient has somewhat better edema control using her compression pumps twice a day and as a result she has much better epithelialization on the left anterior calf area. Only a small open area remains. 01/29/18; the patient has been compliant with her compression pumps. Both the areas on her calf that healed. The remaining area on the left anterior leg is fully epithelialized Readmission: 02/20/2019 upon evaluation today patient presents for reevaluation due to issues that she is having with the bilateral lower extremities. She actually has wounds open on both legs. On the right she has an area in the crease of her leg on the right around the knee region which is actually draining quite a bit and actually has some fungal type appearance to it. She has been on nystatin powder  that seems to have helped to some degree. In regard to the left lower extremity this is actually in the [place]. ([id number]) lower portion of her leg closer to the ankle and again is continuing to drain as well unfortunately. There does not appear to be any signs of active  infection at this time which is good news. No fevers, chills, nausea, vomiting, or diarrhea. She tells me that since she was seen last year she is actually been doing quite well for the most part with regard to her lower extremities. Unfortunately she now is experiencing a little bit more drainage at this time. She is concerned about getting this under control so that it does not get significantly worse. Electronic Signature(s) Signed: 02/20/2019 1:43:37 PM By: Worthy Keeler PA-C Entered By: Worthy Keeler on 02/20/2019 13:43:37 [place], [name] ([id number]) -------------------------------------------------------------------------------- Physical Exam Details Patient Name: [name]. Date of Service: 02/20/2019 1:15 PM Medical Record Number: [id number] Patient Account Number: [id number] Date of Birth/Sex: [birth year] (63 y.o. F) Treating RN: Montey Hora Primary Care Provider: Tandy Gaw Other Clinician: Referring Provider: Tandy Gaw Treating Provider/Extender: STONE III, HOYT Weeks in Treatment: 0 Constitutional sitting or standing blood pressure is within target range for patient.. pulse regular and within target range for patient.Marland Kitchen respirations regular, non-labored and within target range for patient.Marland Kitchen temperature within target range for patient.. Obese and well-hydrated in no acute distress. Eyes conjunctiva clear no eyelid edema noted. pupils equal round and reactive to light and accommodation. Ears, Nose, Mouth, and Throat no gross abnormality of ear auricles or external auditory canals. normal hearing noted during conversation. mucus membranes moist. Respiratory normal breathing without  difficulty. clear to auscultation bilaterally. Cardiovascular regular rate and rhythm with normal S1, S2. Patient has significant bilateral primary lymphedema. Stage III.Marland Kitchen Gastrointestinal (GI) soft, non-tender, non-distended, +BS. no ventral hernia noted. Musculoskeletal Patient unable to walk without assistance. Psychiatric this patient is able to make decisions and demonstrates good insight into disease process. Alert and Oriented x 3. pleasant and cooperative. Notes Upon inspection patient has skin breakdown and drainage noted again around the knee on the right lower extremity which is actually just above the knee and then subsequently on the left lower extremity just above the ankle she has some drainage as well. Fortunately there does not appear to be any evidence of active infection which is great news and she also seems to be taking good care of this she has been using nystatin powder on the right not on the left. No sharp debridement was necessary at either site today. Electronic Signature(s) Signed: 02/20/2019 1:44:39 PM By: Worthy Keeler PA-C Entered By: Worthy Keeler on 02/20/2019 13:44:38 [place], [name] ([id number]) -------------------------------------------------------------------------------- Physician Orders Details Patient Name: [name] Date of Service: 02/20/2019 1:15 PM Medical Record Number: [id number] Patient Account Number: [id number] Date of Birth/Sex: Jul 12, 1955 (63 y.o. F) Treating RN: Montey Hora Primary Care Provider: Tandy Gaw Other Clinician: Referring Provider: Tandy Gaw Treating Provider/Extender: STONE III, HOYT Weeks in Treatment: 0 Verbal / Phone Orders: No Diagnosis Coding ICD-10 Coding Code Description I89.0 Lymphedema, not elsewhere classified L97.822 Non-pressure chronic ulcer of other part of left lower leg with fat layer exposed Wound Cleansing Wound #3 Left,Medial Lower Leg o Dial antibacterial soap, wash  wounds, rinse and pat dry prior to dressing wounds o May Shower, gently pat wound dry prior to applying new dressing. Wound #4 Right,Medial Lower Leg o Dial antibacterial soap, wash wounds, rinse and pat dry prior to dressing wounds o May Shower, gently pat wound dry prior to applying new dressing. Skin Barriers/Peri-Wound Care Wound #4 Right,Medial Lower Leg o Antifungal powder-Nystatin Primary Wound Dressing Wound #3 Left,Medial Lower Leg o Silver Alginate Wound #4 Right,Medial Lower Leg o Silver Alginate Secondary Dressing Wound #3 Left,Medial Lower Leg o  ABD pad Wound #4 Right,Medial Lower Leg o ABD pad Dressing Change Frequency Wound #3 Left,Medial Lower Leg o Change dressing every other day. Wound #4 Right,Medial Lower Leg o Change dressing every other day. Follow-up Appointments o Return Appointment in 1 week. Edema Control [name], Elizabeth ([id number]) Wound #3 Left,Medial Lower Leg o Kerlix and Coban - Left Lower Extremity Patient Medications Allergies: ibuprofen, ACE Inhibitors Notifications Medication Indication Start End nystatin 02/20/2019 DOSE topical 100,000 unit/gram powder - powder topical applied to the rash/wound location with each dressing change as directed Electronic Signature(s) Signed: 02/20/2019 1:47:09 PM By: Worthy Keeler PA-C Entered By: Worthy Keeler on 02/20/2019 13:47:09 [place], [name] ([id number]) -------------------------------------------------------------------------------- Problem List Details Patient Name: [name]. Date of Service: 02/20/2019 1:15 PM Medical Record Number: [id number] Patient Account Number: [id number] Date of Birth/Sex: 1955/11/28 (63 y.o. F) Treating RN: Montey Hora Primary Care Provider: Tandy Gaw Other Clinician: Referring Provider: Tandy Gaw Treating Provider/Extender: STONE III, HOYT Weeks in Treatment: 0 Active Problems ICD-10 Evaluated Encounter Code  Description Active Date Today Diagnosis I89.0 Lymphedema, not elsewhere classified 02/20/2019 No Yes L97.822 Non-pressure chronic ulcer of other part of left lower leg with 02/20/2019 No Yes fat layer exposed L97.812 Non-pressure chronic ulcer of other part of right lower leg 02/20/2019 No Yes with fat layer exposed Inactive Problems Resolved Problems Electronic Signature(s) Signed: 02/20/2019 1:42:38 PM By: Worthy Keeler PA-C Previous Signature: 02/20/2019 1:25:52 PM Version By: Worthy Keeler PA-C Entered By: Worthy Keeler on 02/20/2019 13:42:38 [place], [name] ([id number]) -------------------------------------------------------------------------------- Progress Note Details Patient Name: [name]. Date of Service: 02/20/2019 1:15 PM Medical Record Number: [id number] Patient Account Number: [id number] Date of Birth/Sex: Aug 14, 1955 (63 y.o. F) Treating RN: Montey Hora Primary Care Provider: Tandy Gaw Other Clinician: Referring Provider: Tandy Gaw Treating Provider/Extender: STONE III, HOYT Weeks in Treatment: 0 Subjective Chief Complaint Information obtained from Patient Left LE Ulcer History of Present Illness (HPI) The patient is a 63 year old female with history of hypertension and a long-standing history of bilateral lower extremity lymphedema (first presented on 4/2) . She has had open ulcers in the past which have always responded to compression therapy. She had briefly been to a lymphedema clinic in the past which helped her at the time. this time around she stopped treatment of her lymphedema pumps approximately 2 weeks ago because of some pain in the knees and then noticed the right leg getting worse. She was seen by her PCP who put her on clindamycin 4 times a day 2 days ago. The patient has seen [facility] and Dr. Delana Meyer had seen her last year where a vascular study including venous and arterial duplex studies were within normal limits. he had recommended  compression stockings and lymphedema pumps and the patient has been using this in about 2 weeks ago. She is known to be diabetic but in the past few time she's gone to her primary care doctor her hemoglobin A1c has been normal. 02/11/2015 - after her last visit she took my advice and went to the ER regarding the progressive cellulitis of her right lower extremity and she was admitted between July 17 and 22nd. She received IV antibiotics and then was sent home on a course of steroid-induced and oral antibiotics. She has improved much since then. 02/17/2015 -- she has been doing fine and the weeping of her legs has remarkably gone down. She has no fresh issues. READMISSION 01/15/18 This patient was given this clinic before most recently in  2016 seen by Dr. Con Memos. She has massive bilateral lymphedema and over the last 2 months this had weeping edema out of the left leg. She has compression pumps but her compliance with these has been minimal. She has [facility] they've been using TCA/ABDs/kerlix under an Ace wrap.she has had recent problems with cellulitis. She was apparently seen in the ER and 12/23/17 and given clindamycin. She was then followed by her primary doctor and given doxycycline and Keflex. The pain seems to have settled down. In April 2018 the patient had arterial studies done at [facility]. This showed triphasic waveforms throughout the right leg and mostly triphasic waveforms on the left except for monophasic at the posterior tibial artery distally. She was not felt to have evidence of right lower extremity arterial stenosis or significant problems on the left side. 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 She also had a right lower extremity venous Doppler in January 2018 this was limited by the patient's body habitus and lymphedema. Most of the proximal veins were not visualized The patient presents with an area of denuded skin on  the anterior medial part of the left calf. There is weeping edema fluid here. 01/22/18; the patient has somewhat better edema control using her compression pumps twice a day and as a result she has much better epithelialization on the left anterior calf area. Only a small open area remains. 01/29/18; the patient has been compliant with her compression pumps. Both the areas on her calf that healed. The remaining area on the left anterior leg is fully epithelialized [name], [name]. ([id number]) Readmission: 02/20/2019 upon evaluation today patient presents for reevaluation due to issues that she is having with the bilateral lower extremities. She actually has wounds open on both legs. On the right she has an area in the crease of her leg on the right around the knee region which is actually draining quite a bit and actually has some fungal type appearance to it. She has been on nystatin powder that seems to have helped to some degree. In regard to the left lower extremity this is actually in the lower portion of her leg closer to the ankle and again is continuing to drain as well unfortunately. There does not appear to be any signs of active infection at this time which is good news. No fevers, chills, nausea, vomiting, or diarrhea. She tells me that since she was seen last year she is actually been doing quite well for the most part with regard to her lower extremities. Unfortunately she now is experiencing a little bit more drainage at this time. She is concerned about getting this under control so that it does not get significantly worse. Patient History Information obtained from Patient. Allergies ibuprofen (Reaction: hives, swells), ACE Inhibitors Family History Cancer - Father, Diabetes - Father, Heart Disease - Mother, Hypertension - Mother,Father, Seizures - Mother, Stroke - Mother, Thyroid Problems - Mother,Siblings, No family history of Hereditary Spherocytosis, Kidney Disease, Lung  Disease, Tuberculosis. Social History Former smoker - uses snuff, Marital Status - Single, Alcohol Use - Rarely, Drug Use - No History, Caffeine Use - Daily. 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 Denies history of Aspiration, Chronic Obstructive Pulmonary Disease (COPD), Pneumothorax Cardiovascular Patient has history of Hypertension Denies history of Angina, Arrhythmia, Congestive Heart Failure, Coronary Artery Disease, Deep Vein Thrombosis, Hypotension, Myocardial Infarction, Peripheral Arterial Disease, Peripheral Venous Disease, Phlebitis, Vasculitis Gastrointestinal Denies history of Cirrhosis , Colitis, Crohn s, Hepatitis A, Hepatitis B, Hepatitis C Endocrine Denies history of Type I Diabetes, Type II Diabetes Genitourinary Denies history of End Stage Renal Disease Immunological Denies history of Lupus Erythematosus, Raynaud s, Scleroderma Integumentary (Skin) Denies history of History of Burn, History of pressure wounds Musculoskeletal Patient has history of Gout, Osteoarthritis Denies history of Rheumatoid Arthritis, Osteomyelitis Neurologic Denies history of Dementia, Neuropathy, Quadriplegia, Paraplegia, Seizure Disorder Oncologic [name], Elizabeth ([id number]) Denies history of Received Chemotherapy, Received Radiation Psychiatric Denies history of Anorexia/bulimia, Confinement Anxiety Review of Systems (ROS) Eyes Complains or has symptoms of Glasses / Contacts - glasses. Ear/Nose/Mouth/Throat Denies complaints or symptoms of Difficult clearing ears,  Sinusitis. Hematologic/Lymphatic Denies complaints or symptoms of Bleeding / Clotting Disorders, Human Immunodeficiency Virus. Respiratory Denies complaints or symptoms of Chronic or frequent coughs, Shortness of Breath. Gastrointestinal Denies complaints or symptoms of Frequent diarrhea, Nausea, Vomiting. Endocrine Denies complaints or symptoms of Hepatitis, Thyroid disease, Polydypsia (Excessive Thirst). Genitourinary Denies complaints or symptoms of Kidney failure/ Dialysis, Incontinence/dribbling. Immunological Denies complaints or symptoms of Hives, Itching. Integumentary (Skin) Denies complaints or symptoms of Wounds, Bleeding or bruising tendency, Breakdown, Swelling. Musculoskeletal Denies complaints or symptoms of Muscle Pain, Muscle Weakness. Neurologic Denies complaints or symptoms of Numbness/parasthesias, Focal/Weakness. Psychiatric Denies complaints or symptoms of Anxiety, Claustrophobia. Objective Constitutional sitting or standing blood pressure is within target range for patient.. pulse regular and within target range for patient.Marland Kitchen respirations regular, non-labored and within target range for patient.Marland Kitchen temperature within target range for patient.. Obese and well-hydrated in no acute distress. Vitals Time Taken: 1:03 PM, Height: 62 in, Source: Stated, Weight: 390 lbs, Source: Stated, BMI: 71.3, Temperature: 98.2 F, Pulse: 82 bpm, Respiratory Rate: 16 breaths/min, Blood Pressure: 137/78 mmHg. Eyes conjunctiva clear no eyelid edema noted. pupils equal round and reactive to light and accommodation. Ears, Nose, Mouth, and Throat no gross abnormality of ear auricles or external auditory canals. normal hearing noted during conversation. mucus membranes moist. Respiratory normal breathing without difficulty. clear to auscultation bilaterally. Cardiovascular Weisinger, Elizabeth ([id number]) regular rate and rhythm with normal S1, S2. Patient has significant bilateral  primary lymphedema. Stage III.Marland Kitchen Gastrointestinal (GI) soft, non-tender, non-distended, +BS. no ventral hernia noted. Musculoskeletal Patient unable to walk without assistance. Psychiatric this patient is able to make decisions and demonstrates good insight into disease process. Alert and Oriented x 3. pleasant and cooperative. General Notes: Upon inspection patient has skin breakdown and drainage noted again around the knee on the right lower extremity which is actually just above the knee and then subsequently on the left lower extremity just above the ankle she has some drainage as well. Fortunately there does not appear to be any evidence of active infection which is great news and she also seems to be taking good care of this she has been using nystatin powder on the right not on the left. No sharp debridement was necessary at either site today. Integumentary (Hair, Skin) Wound #3 status is Open. Original cause of wound was Blister. The wound is located on the Left,Medial Lower Leg. The wound measures 6.3cm length x 6cm width x 0.1cm depth; 29.688cm^2 area and 2.969cm^3 volume. There is Fat Layer (Subcutaneous Tissue) Exposed exposed. There is no  tunneling or undermining noted. There is a medium amount of serous drainage noted. There is medium (34-66%) red granulation within the wound bed. There is a medium (34-66%) amount of necrotic tissue within the wound bed including Adherent Slough. Wound #4 status is Open. Original cause of wound was Gradually Appeared. The wound is located on the Right,Medial Lower Leg. The wound measures 4cm length x 12cm width x 0.1cm depth; 37.699cm^2 area and 3.77cm^3 volume. There is Fat Layer (Subcutaneous Tissue) Exposed exposed. There is no tunneling or undermining noted. There is a large amount of serous drainage noted. The wound margin is indistinct and nonvisible. There is medium (34-66%) pink granulation within the wound bed. There is a medium (34-66%)  amount of necrotic tissue within the wound bed including Adherent Slough. Assessment Active Problems ICD-10 Lymphedema, not elsewhere classified Non-pressure chronic ulcer of other part of left lower leg with fat layer exposed Non-pressure chronic ulcer of other part of right lower leg with fat layer exposed Procedures Wound #3 Pre-procedure diagnosis of Wound #3 is a Lymphedema located on the Left,Medial Lower Leg . There was a Chemical/Enzymatic/Mechanical debridement performed by STONE III, HOYT E., PA-C. With the following instrument(s): gauze after achieving pain control using Lidocaine 4% Topical Solution. Other agent used was normal saline. A time out was conducted at 13:45, prior to the start of the procedure. There was no bleeding. The procedure was tolerated well with a pain level of 0 throughout and a pain level of 0 following the procedure. Post Debridement Measurements: 6.3cm length x 6cm width x 0.1cm depth; 2.969cm^3 volume. EMY, ANGEVINE ([id number]) Character of Wound/Ulcer Post Debridement is improved. Post procedure Diagnosis Wound #3: Same as Pre-Procedure Wound #4 Pre-procedure diagnosis of Wound #4 is a Lymphedema located on the Right,Medial Lower Leg . There was a Chemical/Enzymatic/Mechanical debridement performed by STONE III, HOYT E., PA-C. With the following instrument(s): gauze after achieving pain control using Lidocaine 4% Topical Solution. Other agent used was normal saline. A time out was conducted at 13:46, prior to the start of the procedure. There was no bleeding. The procedure was tolerated well with a pain level of 0 throughout and a pain level of 0 following the procedure. Post Debridement Measurements: 4cm length x 12cm width x 0.1cm depth; 3.77cm^3 volume. Character of Wound/Ulcer Post Debridement is improved. Post procedure Diagnosis Wound #4: Same as Pre-Procedure Plan Wound Cleansing: Wound #3 Left,Medial Lower Leg: Dial antibacterial soap,  wash wounds, rinse and pat dry prior to dressing wounds May Shower, gently pat wound dry prior to applying new dressing. Wound #4 Right,Medial Lower Leg: Dial antibacterial soap, wash wounds, rinse and pat dry prior to dressing wounds May Shower, gently pat wound dry prior to applying new dressing. Skin Barriers/Peri-Wound Care: Wound #4 Right,Medial Lower Leg: Antifungal powder-Nystatin Primary Wound Dressing: Wound #3 Left,Medial Lower Leg: Silver Alginate Wound #4 Right,Medial Lower Leg: Silver Alginate Secondary Dressing: Wound #3 Left,Medial Lower Leg: ABD pad Wound #4 Right,Medial Lower Leg: ABD pad Dressing Change Frequency: Wound #3 Left,Medial Lower Leg: Change dressing every other day. Wound #4 Right,Medial Lower Leg: Change dressing every other day. Follow-up Appointments: Return Appointment in 1 week. Edema Control: Wound #3 Left,Medial Lower Leg: Kerlix and Coban - Left Lower Extremity The following medication(s) was prescribed: nystatin topical 100,000 unit/gram powder powder topical applied to the rash/wound location with each dressing change as directed starting 02/20/2019 [name], [name] ([id number]) 1. At this point in regard to the left lower extremity I think that using  a Kerlix and Coban wrap along with silver alginate dressing would be appropriate to help to both secure as well as help with some of the fluid buildup right around the wound location. 2. With regard to the right lower extremity she is can use a compression sleeve that she has already and then subsequently will use a silver alginate dressing at this location as well along with an ABD pad to secure this in place with her compression sleeve. 3. I do believe it would be appropriate for the patient to continue to use her lymphedema pumps in fact I think it would be a good idea for her to do so. Again I do believe this can cause this to drain a little bit more initially but in the long run it  was actually cut back on the amount of edema in her legs and subsequently will help with both healing as well as prevention of future issues. 4. She will continue to use the nystatin powder on the right wound region as it does appear that there is a fungal infection at this site. 5. I recommend the patient still continue to elevate her legs as much as she is able to currently. We will see patient back for reevaluation in 1 week here in the clinic. If anything worsens or changes patient will contact our office for additional recommendations. Electronic Signature(s) Signed: 02/20/2019 2:16:05 PM By: Worthy Keeler PA-C Previous Signature: 02/20/2019 1:47:18 PM Version By: Worthy Keeler PA-C Entered By: Worthy Keeler on 02/20/2019 14:16:02 [name], [name] ([id number]) -------------------------------------------------------------------------------- ROS/PFSH Details Patient Name: [name] Date of Service: 02/20/2019 1:15 PM Medical Record Number: [id number] Patient Account Number: [id number] Date of Birth/Sex: Apr 19, 1956 (63 y.o. F) Treating RN: Army Melia Primary Care Provider: Tandy Gaw Other Clinician: Referring Provider: Tandy Gaw Treating Provider/Extender: STONE III, HOYT Weeks in Treatment: 0 Information Obtained From Patient Eyes Complaints and Symptoms: Positive for: Glasses / Contacts - glasses Medical History: Positive for: Cataracts Negative for: Glaucoma; Optic Neuritis Ear/Nose/Mouth/Throat Complaints and Symptoms: Negative for: Difficult clearing ears; Sinusitis Medical History: Negative for: Chronic sinus problems/congestion; Middle ear problems Hematologic/Lymphatic Complaints and Symptoms: Negative for: Bleeding / Clotting Disorders; Human Immunodeficiency Virus Medical History: Positive for: Lymphedema Negative for: Anemia; Hemophilia; Human Immunodeficiency Virus; Sickle Cell Disease Respiratory Complaints and Symptoms: Negative for:  Chronic or frequent coughs; Shortness of Breath Medical History: Positive for: Asthma - controlled; Sleep Apnea - C-pap; 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 ([id number]) Medical History: Negative for: Type I Diabetes; Type II Diabetes Genitourinary Complaints and Symptoms: Negative for: Kidney failure/ Dialysis; Incontinence/dribbling Medical History: Negative for: End Stage Renal Disease Immunological Complaints and Symptoms: Negative for: Hives; Itching Medical History: Negative for: Lupus Erythematosus; Raynaudos; Scleroderma Integumentary (Skin) Complaints and Symptoms: Negative for: Wounds; Bleeding or bruising tendency; Breakdown; Swelling Medical History: Negative for: History of Burn; History of pressure wounds Musculoskeletal Complaints and Symptoms: Negative for: Muscle Pain; Muscle Weakness Medical History: Positive for: Gout; Osteoarthritis Negative for: Rheumatoid Arthritis; Osteomyelitis Neurologic Complaints and Symptoms: Negative for: Numbness/parasthesias; Focal/Weakness Medical History: Negative for: Dementia; Neuropathy; Quadriplegia; Paraplegia; Seizure Disorder Psychiatric Complaints and Symptoms: Negative for: Anxiety; Claustrophobia Medical History: Negative for: Anorexia/bulimia; Confinement Anxiety Cardiovascular Medical History:  Positive for: Hypertension Negative for: Angina; Arrhythmia; Congestive Heart Failure; Coronary Artery Disease; Deep Vein Thrombosis; Hypotension; Myocardial Infarction; Peripheral Arterial Disease; Peripheral Venous  Disease; Phlebitis; Vasculitis BERLINE, SEMRAD. ([id number]) Oncologic Medical History: Negative for: Received Chemotherapy; Received Radiation HBO Extended History Items Eyes: Cataracts Immunizations Pneumococcal Vaccine: Received Pneumococcal Vaccination: Yes Implantable Devices No devices added Family and Social History Cancer: Yes - Father; Diabetes: Yes - Father; Heart Disease: Yes - Mother; Hereditary Spherocytosis: No; Hypertension: Yes - Mother,Father; Kidney Disease: No; Lung Disease: No; Seizures: Yes - Mother; Stroke: Yes - Mother; Thyroid Problems: Yes - Mother,Siblings; Tuberculosis: No; Former smoker - uses snuff; Marital Status - Single; Alcohol Use: Rarely; Drug Use: No History; Caffeine Use: Daily; Financial Concerns: No; Food, Clothing or Shelter Needs: No; Support System Lacking: No; Transportation Concerns: No Electronic Signature(s) Signed: 02/20/2019 3:55:59 PM By: Army Melia Signed: 02/21/2019 2:28:02 AM By: Worthy Keeler PA-C Entered By: Army Melia on 02/20/2019 [place], [name] ([id number]) -------------------------------------------------------------------------------- SuperBill Details Patient Name: [name]. Date of Service: 02/20/2019 Medical Record Number: [id number] Patient Account Number: [id number] Date of Birth/Sex: 11/04/1955 (63 y.o. F) Treating RN: Montey Hora Primary Care Provider: Tandy Gaw Other Clinician: Referring Provider: Tandy Gaw Treating Provider/Extender: STONE III, HOYT Weeks in Treatment: 0 Diagnosis Coding ICD-10 Codes Code Description I89.0 Lymphedema, not elsewhere classified L97.822 Non-pressure chronic ulcer of other part of left lower leg with fat layer exposed L97.812 Non-pressure chronic ulcer of other part of right lower leg with fat layer exposed Facility Procedures CPT4 Code: [id number] Description: 51700 - WOUND CARE VISIT-LEV 5 EST PT Modifier: Quantity: 1 Physician Procedures CPT4  Code Description: 1749449 99214 - WC PHYS LEVEL 4 - EST PT ICD-10 Diagnosis Description I89.0 Lymphedema, not elsewhere classified L97.822 Non-pressure chronic ulcer of other part of left lower leg wit L97.812 Non-pressure chronic ulcer of other  part of right lower leg wi Modifier: h fat layer expos th fat layer expo Quantity: 1 ed sed Electronic Signature(s) Signed: 02/20/2019 2:08:51 PM By: Montey Hora Signed: 02/21/2019 2:28:02 AM By: Worthy Keeler PA-C Previous Signature: 02/20/2019 2:00:35 PM Version By: Montey Hora Previous Signature: 02/20/2019 1:47:30 PM Version By: Worthy Keeler PA-C Entered By: Montey Hora on 02/20/2019 14:08:50

## 2019-02-27 ENCOUNTER — Encounter: Payer: Medicaid Other | Admitting: Physician Assistant

## 2019-02-27 ENCOUNTER — Other Ambulatory Visit: Payer: Self-pay

## 2019-02-27 DIAGNOSIS — I890 Lymphedema, not elsewhere classified: Secondary | ICD-10-CM | POA: Diagnosis not present

## 2019-02-27 NOTE — Progress Notes (Addendum)
GLADYCE, WELLENS ([id number]) Visit Report for 02/27/2019 Chief Complaint Document Details Patient Name: [name], [name]. Date of Service: 02/27/2019 8:30 AM Medical Record Number: [id number] Patient Account Number: [id number] Date of Birth/Sex: 07-12-1955 (63 y.o. F) Treating RN: Montey Hora Primary Care Provider: Tandy Gaw Other Clinician: Referring Provider: Tandy Gaw Treating Provider/Extender: Melburn Hake, HOYT Weeks in Treatment: 1 Information Obtained from: Patient Chief Complaint Left LE Ulcer Electronic Signature(s) Signed: 02/27/2019 8:30:01 AM By: Worthy Keeler PA-C Entered By: Worthy Keeler on 02/27/2019 08:30:00 Blanchet, [name] ([id number]) -------------------------------------------------------------------------------- HPI Details Patient Name: [name]. Date of Service: 02/27/2019 8:30 AM Medical Record Number: [id number] Patient Account Number: [id number] Date of Birth/Sex: 07-03-1956 (63 y.o. F) Treating RN: Montey Hora Primary Care Provider: Tandy Gaw Other Clinician: Referring Provider: Tandy Gaw Treating Provider/Extender: STONE III, HOYT Weeks in Treatment: 1 History of Present Illness HPI Description: The patient is a 63 year old female with history of hypertension and a long-standing history of bilateral lower extremity lymphedema (first presented on 4/2) . She has had open ulcers in the past which have always responded to compression therapy. She had briefly been to a lymphedema clinic in the past which helped her at the time. this time around she stopped treatment of her lymphedema pumps approximately 2 weeks ago because of some pain in the knees and then noticed the right leg getting worse. She was seen by her PCP who put her on clindamycin 4 times a day 2 days ago. The patient has seen [facility] and Dr. Delana Meyer had seen her last year where a vascular study including venous and arterial duplex studies were within normal limits. he  had recommended compression stockings and lymphedema pumps and the patient has been using this in about 2 weeks ago. She is known to be diabetic but in the past few time she's gone to her primary care doctor her hemoglobin A1c has been normal. 02/11/2015 - after her last visit she took my advice and went to the ER regarding the progressive cellulitis of her right lower extremity and she was admitted between July 17 and 22nd. She received IV antibiotics and then was sent home on a course of steroid-induced and oral antibiotics. She has improved much since then. 02/17/2015 -- she has been doing fine and the weeping of her legs has remarkably gone down. She has no fresh issues. READMISSION 01/15/18 This patient was given this clinic before most recently in 2016 seen by Dr. Con Memos. She has massive bilateral lymphedema and over the last 2 months this had weeping edema out of the left leg. She has compression pumps but her compliance with these has been minimal. She has [facility] they've been using TCA/ABDs/kerlix under an Ace wrap.she has had recent problems with cellulitis. She was apparently seen in the ER and 12/23/17 and given clindamycin. She was then followed by her primary doctor and given doxycycline and Keflex. The pain seems to have settled down. In April 2018 the patient had arterial studies done at [facility]. This showed triphasic waveforms throughout the right leg and mostly triphasic waveforms on the left except for monophasic at the posterior tibial artery distally. She was not felt to have evidence of right lower extremity arterial stenosis or significant problems on the left side. 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 She also had a right lower extremity venous Doppler in January 2018 this was limited by the patient's body habitus and lymphedema. Most of the  proximal veins were not visualized The patient presents with an area of  denuded skin on the anterior medial part of the left calf. There is weeping edema fluid here. 01/22/18; the patient has somewhat better edema control using her compression pumps twice a day and as a result she has much better epithelialization on the left anterior calf area. Only a small open area remains. 01/29/18; the patient has been compliant with her compression pumps. Both the areas on her calf that healed. The remaining area on the left anterior leg is fully epithelialized Readmission: 02/20/2019 upon evaluation today patient presents for reevaluation due to issues that she is having with the bilateral lower extremities. She actually has wounds open on both legs. On the right she has an area in the crease of her leg on the right around the knee region which is actually draining quite a bit and actually has some fungal type appearance to it. She has been on nystatin powder that seems to have helped to some degree. In regard to the left lower extremity this is actually in the [place]. ([id number]) lower portion of her leg closer to the ankle and again is continuing to drain as well unfortunately. There does not appear to be any signs of active infection at this time which is good news. No fevers, chills, nausea, vomiting, or diarrhea. She tells me that since she was seen last year she is actually been doing quite well for the most part with regard to her lower extremities. Unfortunately she now is experiencing a little bit more drainage at this time. She is concerned about getting this under control so that it does not get significantly worse. 02/27/2019 on evaluation today patient appears to be doing somewhat better in regard to her bilateral lower extremity wounds. She has been tolerating the dressing changes without complication. Fortunately there is no signs of active infection at this point. No fevers, chills, nausea, vomiting, or diarrhea. She did get her dressing supplies which is  excellent news she was extremely excited to get these. She also got paperwork from [employer] for their financial assistance program where they may be able to help her out in the future if needed with supplies at discounted prices. Electronic Signature(s) Signed: 02/27/2019 9:18:31 AM By: Worthy Keeler PA-C Entered By: Worthy Keeler on 02/27/2019 09:18:31 Lafortune, [name] ([id number]) -------------------------------------------------------------------------------- Physical Exam Details Patient Name: SINCERITY, CATHCART C. Date of Service: 02/27/2019 8:30 AM Medical Record Number: [id number] Patient Account Number: [id number] Date of Birth/Sex: 27-Dec-1955 (63 y.o. F) Treating RN: Montey Hora Primary Care Provider: Tandy Gaw Other Clinician: Referring Provider: Tandy Gaw Treating Provider/Extender: STONE III, HOYT Weeks in Treatment: 1 Constitutional Well-nourished and well-hydrated in no acute distress. Respiratory normal breathing without difficulty. clear to auscultation bilaterally. Cardiovascular regular rate and rhythm with normal S1, S2. Psychiatric this patient is able to make decisions and demonstrates good insight into disease process. Alert and Oriented x 3. pleasant and cooperative. Notes Upon inspection patient's wound bed currently showed signs of good granulation at this time. Fortunately there did not appear to be any signs of active infection which is also good news. She seems to be doing much better from the standpoint of weeping although again there is still definite weeping noted at this point. The slough on the right lower extremity was essentially nonexistent and on the left lower extremity was able to be mechanically debrided away with saline and gauze without complication. She has not been using her lymphedema  pumps I think this is something she definitely needs to do on a regular basis every day. Electronic Signature(s) Signed: 02/27/2019 9:20:16 AM By: Worthy Keeler PA-C Entered By: Worthy Keeler on 02/27/2019 09:20:15 [name] ([id number]) -------------------------------------------------------------------------------- Physician Orders Details Patient Name: [name] Date of Service: 02/27/2019 8:30 AM Medical Record Number: [id number] Patient Account Number: [id number] Date of Birth/Sex: 1956-03-01 (63 y.o. F) Treating RN: Montey Hora Primary Care Provider: Tandy Gaw Other Clinician: Referring Provider: Tandy Gaw Treating Provider/Extender: STONE III, HOYT Weeks in Treatment: 1 Verbal / Phone Orders: No Diagnosis Coding ICD-10 Coding Code Description I89.0 Lymphedema, not elsewhere classified L97.822 Non-pressure chronic ulcer of other part of left lower leg with fat layer exposed L97.812 Non-pressure chronic ulcer of other part of right lower leg with fat layer exposed Wound Cleansing Wound #3 Left,Medial Lower Leg o Dial antibacterial soap, wash wounds, rinse and pat dry prior to dressing wounds o May Shower, gently pat wound dry prior to applying new dressing. Wound #4 Right,Medial Lower Leg o Dial antibacterial soap, wash wounds, rinse and pat dry prior to dressing wounds o May Shower, gently pat wound dry prior to applying new dressing. Skin Barriers/Peri-Wound Care Wound #4 Right,Medial Lower Leg o Antifungal powder-Nystatin Primary Wound Dressing Wound #3 Left,Medial Lower Leg o Silver Alginate Wound #4 Right,Medial Lower Leg o Silver Alginate Secondary Dressing Wound #3 Left,Medial Lower Leg o ABD pad Wound #4 Right,Medial Lower Leg o ABD pad Dressing Change Frequency Wound #3 Left,Medial Lower Leg o Change dressing every other day. Wound #4 Right,Medial Lower Leg o Change dressing every other day. Follow-up Appointments o Return Appointment in 1 week. ARLETHIA, BOCH ([id number]) Edema Control Wound #3 Left,Medial Lower Leg o Kerlix and Coban - Left Lower  Extremity Electronic Signature(s) Signed: 02/27/2019 4:48:17 PM By: Montey Hora Signed: 02/27/2019 4:53:08 PM By: Worthy Keeler PA-C Entered By: Montey Hora on 02/27/2019 09:06:34 Kleiman, [name] ([id number]) -------------------------------------------------------------------------------- Problem List Details Patient Name: SHAKELA, DAQUINO C. Date of Service: 02/27/2019 8:30 AM Medical Record Number: [id number] Patient Account Number: [id number] Date of Birth/Sex: 12/14/1955 (63 y.o. F) Treating RN: Montey Hora Primary Care Provider: Tandy Gaw Other Clinician: Referring Provider: Tandy Gaw Treating Provider/Extender: Melburn Hake, HOYT Weeks in Treatment: 1 Active Problems ICD-10 Evaluated Encounter Code Description Active Date Today Diagnosis I89.0 Lymphedema, not elsewhere classified 02/20/2019 No Yes L97.822 Non-pressure chronic ulcer of other part of left lower leg with 02/20/2019 No Yes fat layer exposed L97.812 Non-pressure chronic ulcer of other part of right lower leg 02/20/2019 No Yes with fat layer exposed Inactive Problems Resolved Problems Electronic Signature(s) Signed: 02/27/2019 8:29:53 AM By: Worthy Keeler PA-C Entered By: Worthy Keeler on 02/27/2019 08:29:53 Thornley, [name] ([id number]) -------------------------------------------------------------------------------- Progress Note Details Patient Name: [name]. Date of Service: 02/27/2019 8:30 AM Medical Record Number: [id number] Patient Account Number: [id number] Date of Birth/Sex: [birth year] (63 y.o. F) Treating RN: Montey Hora Primary Care Provider: Tandy Gaw Other Clinician: Referring Provider: Tandy Gaw Treating Provider/Extender: STONE III, HOYT Weeks in Treatment: 1 Subjective Chief Complaint Information obtained from Patient Left LE Ulcer History of Present Illness (HPI) The patient is a 63 year old female with history of hypertension and a long-standing history of  bilateral lower extremity lymphedema (first presented on 4/2) . She has had open ulcers in the past which have always responded to compression therapy. She had briefly been to a lymphedema clinic in the past which helped her at the time. this time around  she stopped treatment of her lymphedema pumps approximately 2 weeks ago because of some pain in the knees and then noticed the right leg getting worse. She was seen by her PCP who put her on clindamycin 4 times a day 2 days ago. The patient has seen [facility] and Dr. Delana Meyer had seen her last year where a vascular study including venous and arterial duplex studies were within normal limits. he had recommended compression stockings and lymphedema pumps and the patient has been using this in about 2 weeks ago. She is known to be diabetic but in the past few time she's gone to her primary care doctor her hemoglobin A1c has been normal. 02/11/2015 - after her last visit she took my advice and went to the ER regarding the progressive cellulitis of her right lower extremity and she was admitted between July 17 and 22nd. She received IV antibiotics and then was sent home on a course of steroid-induced and oral antibiotics. She has improved much since then. 02/17/2015 -- she has been doing fine and the weeping of her legs has remarkably gone down. She has no fresh issues. READMISSION 01/15/18 This patient was given this clinic before most recently in 2016 seen by Dr. Con Memos. She has massive bilateral lymphedema and over the last 2 months this had weeping edema out of the left leg. She has compression pumps but her compliance with these has been minimal. She has [facility] they've been using TCA/ABDs/kerlix under an Ace wrap.she has had recent problems with cellulitis. She was apparently seen in the ER and 12/23/17 and given clindamycin. She was then followed by her primary doctor and given doxycycline and Keflex. The pain seems to have settled  down. In April 2018 the patient had arterial studies done at [facility]. This showed triphasic waveforms throughout the right leg and mostly triphasic waveforms on the left except for monophasic at the posterior tibial artery distally. She was not felt to have evidence of right lower extremity arterial stenosis or significant problems on the left side. 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 She also had a right lower extremity venous Doppler in January 2018 this was limited by the patient's body habitus and lymphedema. Most of the proximal veins were not visualized The patient presents with an area of denuded skin on the anterior medial part of the left calf. There is weeping edema fluid here. 01/22/18; the patient has somewhat better edema control using her compression pumps twice a day and as a result she has much better epithelialization on the left anterior calf area. Only a small open area remains. 01/29/18; the patient has been compliant with her compression pumps. Both the areas on her calf that healed. The remaining area on the left anterior leg is fully epithelialized EVETTA, FULP. ([id number]) Readmission: 02/20/2019 upon evaluation today patient presents for reevaluation due to issues that she is having with the bilateral lower extremities. She actually has wounds open on both legs. On the right she has an area in the crease of her leg on the right around the knee region which is actually draining quite a bit and actually has some fungal type appearance to it. She has been on nystatin powder that seems to have helped to some degree. In regard to the left lower extremity this is actually in the lower portion of her leg closer to the ankle and again is continuing to drain as well unfortunately. There does not appear to  be any signs of active infection at this time which is good news. No fevers, chills, nausea, vomiting, or diarrhea. She tells  me that since she was seen last year she is actually been doing quite well for the most part with regard to her lower extremities. Unfortunately she now is experiencing a little bit more drainage at this time. She is concerned about getting this under control so that it does not get significantly worse. 02/27/2019 on evaluation today patient appears to be doing somewhat better in regard to her bilateral lower extremity wounds. She has been tolerating the dressing changes without complication. Fortunately there is no signs of active infection at this point. No fevers, chills, nausea, vomiting, or diarrhea. She did get her dressing supplies which is excellent news she was extremely excited to get these. She also got paperwork from [employer] for their financial assistance program where they may be able to help her out in the future if needed with supplies at discounted prices. Patient History Information obtained from Patient. Family History Cancer - Father, Diabetes - Father, Heart Disease - Mother, Hypertension - Mother,Father, Seizures - Mother, Stroke - Mother, Thyroid Problems - Mother,Siblings, No family history of Hereditary Spherocytosis, Kidney Disease, Lung Disease, Tuberculosis. Social History Former smoker - uses snuff, Marital Status - Single, Alcohol Use - Rarely, Drug Use - No History, Caffeine Use - Daily. 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 Denies history of Aspiration, Chronic Obstructive Pulmonary Disease (COPD), Pneumothorax Cardiovascular Patient has history of  Hypertension Denies history of Angina, Arrhythmia, Congestive Heart Failure, Coronary Artery Disease, Deep Vein Thrombosis, Hypotension, Myocardial Infarction, Peripheral Arterial Disease, Peripheral Venous Disease, Phlebitis, Vasculitis Gastrointestinal Denies history of Cirrhosis , Colitis, Crohn s, Hepatitis A, Hepatitis B, Hepatitis C Endocrine Denies history of Type I Diabetes, Type II Diabetes Genitourinary Denies history of End Stage Renal Disease Immunological Denies history of Lupus Erythematosus, Raynaud s, Scleroderma Integumentary (Skin) Denies history of History of Burn, History of pressure wounds Musculoskeletal Patient has history of Gout, Osteoarthritis Denies history of Rheumatoid Arthritis, Osteomyelitis Neurologic Raider, Stefany C. ([id number]) Denies history of Dementia, Neuropathy, Quadriplegia, Paraplegia, Seizure Disorder Oncologic Denies history of Received Chemotherapy, Received Radiation Psychiatric Denies history of Anorexia/bulimia, Confinement Anxiety Review of Systems (ROS) Constitutional Symptoms (General Health) Denies complaints or symptoms of Fatigue, Fever, Chills, Marked Weight Change. Respiratory Denies complaints or symptoms of Chronic or frequent coughs, Shortness of Breath. Cardiovascular Complains or has symptoms of LE edema. Denies complaints or symptoms of Chest pain. Psychiatric Denies complaints or symptoms of Anxiety, Claustrophobia. Objective Constitutional Well-nourished and well-hydrated in no acute distress. Vitals Time Taken: 8:34 AM, Height: 62 in, Weight: 390 lbs, BMI: 71.3, Temperature: 98.9 F, Pulse: 74 bpm, Respiratory Rate: 16 breaths/min, Blood Pressure: 110/79 mmHg. Respiratory normal breathing without difficulty. clear to auscultation bilaterally. Cardiovascular regular rate and rhythm with normal S1, S2. Psychiatric this patient is able to make decisions and demonstrates good insight into disease process. Alert  and Oriented x 3. pleasant and cooperative. General Notes: Upon inspection patient's wound bed currently showed signs of good granulation at this time. Fortunately there did not appear to be any signs of active infection which is also good news. She  seems to be doing much better from the standpoint of weeping although again there is still definite weeping noted at this point. The slough on the right lower extremity was essentially nonexistent and on the left lower extremity was able to be mechanically debrided away with saline and gauze without complication. She has not been using her lymphedema pumps I think this is something she definitely needs to do on a regular basis every day. Integumentary (Hair, Skin) Wound #3 status is Open. Original cause of wound was Blister. The wound is located on the Left,Medial Lower Leg. The wound measures 6cm length x 6cm width x 0.1cm depth; 28.274cm^2 area and 2.827cm^3 volume. There is Fat Layer (Subcutaneous Tissue) Exposed exposed. There is no tunneling or undermining noted. There is a medium amount of serous drainage noted. The wound margin is thickened. There is medium (34-66%) red granulation within the wound bed. There is a medium (34-66%) amount of necrotic tissue within the wound bed including Adherent Slough. NAN, RUMBERGER ([id number]) Wound #4 status is Open. Original cause of wound was Gradually Appeared. The wound is located on the Right,Medial Lower Leg. The wound measures 4cm length x 12cm width x 0.1cm depth; 37.699cm^2 area and 3.77cm^3 volume. There is Fat Layer (Subcutaneous Tissue) Exposed exposed. There is no tunneling or undermining noted. There is a large amount of serous drainage noted. The wound margin is indistinct and nonvisible. There is medium (34-66%) pink granulation within the wound bed. There is a medium (34-66%) amount of necrotic tissue within the wound bed including Adherent Slough. Assessment Active  Problems ICD-10 Lymphedema, not elsewhere classified Non-pressure chronic ulcer of other part of left lower leg with fat layer exposed Non-pressure chronic ulcer of other part of right lower leg with fat layer exposed Plan Wound Cleansing: Wound #3 Left,Medial Lower Leg: Dial antibacterial soap, wash wounds, rinse and pat dry prior to dressing wounds May Shower, gently pat wound dry prior to applying new dressing. Wound #4 Right,Medial Lower Leg: Dial antibacterial soap, wash wounds, rinse and pat dry prior to dressing wounds May Shower, gently pat wound dry prior to applying new dressing. Skin Barriers/Peri-Wound Care: Wound #4 Right,Medial Lower Leg: Antifungal powder-Nystatin Primary Wound Dressing: Wound #3 Left,Medial Lower Leg: Silver Alginate Wound #4 Right,Medial Lower Leg: Silver Alginate Secondary Dressing: Wound #3 Left,Medial Lower Leg: ABD pad Wound #4 Right,Medial Lower Leg: ABD pad Dressing Change Frequency: Wound #3 Left,Medial Lower Leg: Change dressing every other day. Wound #4 Right,Medial Lower Leg: Change dressing every other day. Follow-up Appointments: Return Appointment in 1 week. Edema Control: Wound #3 Left,Medial Lower Leg: Kerlix and Coban - Left Lower Extremity Kulig, Dashanna C. ([id number]) 1. I would recommend that we continue with the silver alginate dressing along with the nystatin powder to be applied to both weeping areas of the bilateral lower extremities. 2. I recommend as well the patient continue to wrap her legs as we have been doing over the past week that seems to be doing well for her. 3. I am also going to suggest currently that we have her use her lymphedema pumps on a regular basis she states that since she got them changed out with new ones she is not sure that they are functioning quite as well as they used to. For that reason I suggested that she contact the company in order to have them come out and service/check the pumps to  ensure they are indeed working appropriately. We will see patient back for reevaluation in 1 week  here in the clinic. If anything worsens or changes patient will contact our office for additional recommendations. Electronic Signature(s) Signed: 02/27/2019 9:20:53 AM By: Worthy Keeler PA-C Entered By: Worthy Keeler on 02/27/2019 09:20:52 [name] ([id number]) -------------------------------------------------------------------------------- ROS/PFSH Details Patient Name: [name] Date of Service: 02/27/2019 8:30 AM Medical Record Number: [id number] Patient Account Number: [id number] Date of Birth/Sex: Jun 05, 1956 (63 y.o. 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 Negative for: Aspiration; Chronic Obstructive Pulmonary Disease (COPD); Pneumothorax Cardiovascular Complaints and Symptoms: Positive for: LE edema Negative for: Chest pain Medical History: Positive for: Hypertension Negative for: Angina; Arrhythmia; Congestive Heart Failure; Coronary Artery Disease; Deep Vein Thrombosis; Hypotension; Myocardial Infarction; Peripheral Arterial Disease; Peripheral Venous Disease; Phlebitis; Vasculitis Psychiatric Complaints and Symptoms: Negative for: Anxiety; Claustrophobia Medical History: Negative for: Anorexia/bulimia; Confinement Anxiety Eyes Medical History: Positive for: Cataracts Negative for: Glaucoma; Optic Neuritis Ear/Nose/Mouth/Throat Medical  History: Negative for: Chronic sinus problems/congestion; Middle ear problems Hematologic/Lymphatic Medical History: Positive for: Lymphedema PATRISE, WILLIAMSEN. ([id number]) Negative for: Anemia; Hemophilia; Human Immunodeficiency Virus; Sickle Cell Disease Gastrointestinal Medical History: Negative for: Cirrhosis ; Colitis; Crohnos; Hepatitis A; Hepatitis B; Hepatitis C Endocrine Medical History: Negative for: Type I Diabetes; Type II Diabetes Genitourinary Medical History: Negative for: End Stage Renal Disease Immunological Medical History: Negative for: Lupus Erythematosus; Raynaudos; Scleroderma Integumentary (Skin) Medical History: Negative for: History of Burn; History of pressure wounds Musculoskeletal Medical History: Positive for: Gout; Osteoarthritis Negative for: Rheumatoid Arthritis; Osteomyelitis Neurologic Medical History: Negative for: Dementia; Neuropathy; Quadriplegia; Paraplegia; Seizure Disorder Oncologic Medical History: Negative for: Received Chemotherapy; Received Radiation HBO Extended History Items Eyes: Cataracts Immunizations Pneumococcal Vaccine: Received Pneumococcal Vaccination: Yes Implantable Devices No devices added Family and Social History Cancer: Yes - Father; Diabetes: Yes - Father; Heart Disease: Yes - Mother; Hereditary Spherocytosis: No; Hypertension: Yes - Mother,Father; Kidney Disease: No; Lung Disease: No; Seizures: Yes - Mother; Stroke: Yes - Mother; Thyroid Problems: Yes - Mother,Siblings; Tuberculosis: No; Former smoker - uses snuff; Marital Status - Single; Alcohol Use: Rarely; Drug Use: SHANEIA, GAYMON C. ([id number]) No History; Caffeine Use: Daily; Financial Concerns: No; Food, Clothing or Shelter Needs: No; Support System Lacking: No; Transportation Concerns: No Physician Affirmation I have reviewed and agree with the above information. Electronic Signature(s) Signed: 02/27/2019 4:48:17 PM By: Montey Hora Signed:  02/27/2019 4:53:08 PM By: Worthy Keeler PA-C Entered By: Worthy Keeler on 02/27/2019 09:19:34 Fundora, [name] ([id number]) -------------------------------------------------------------------------------- SuperBill Details Patient Name: [name]. Date of Service: 02/27/2019 Medical Record Number: [id number] Patient Account Number: [id number] Date of Birth/Sex: 06-15-1956 (63 y.o. F) Treating RN: Montey Hora Primary Care Provider: Tandy Gaw Other Clinician: Referring Provider: Tandy Gaw Treating Provider/Extender: STONE III, HOYT Weeks in Treatment: 1 Diagnosis Coding ICD-10 Codes Code Description I89.0 Lymphedema, not elsewhere classified L97.822 Non-pressure chronic ulcer of other part of left lower leg with fat layer exposed L97.812 Non-pressure chronic ulcer of other part of right lower leg with fat layer exposed Facility Procedures CPT4 Code: [id number] Description: [id number] - WOUND CARE VISIT-LEV 4 EST PT Modifier: Quantity: 1 Physician Procedures CPT4 Code Description: [id number] -  WC PHYS LEVEL 4 - EST PT ICD-10 Diagnosis Description I89.0 Lymphedema, not elsewhere classified L97.822 Non-pressure chronic ulcer of other part of left lower leg wit L97.812 Non-pressure chronic ulcer of other  part of right lower leg wi Modifier: h fat layer expos th fat layer expo Quantity: 1 ed sed Electronic Signature(s) Signed: 02/27/2019 9:29:39 AM By: Worthy Keeler PA-C Entered By: Worthy Keeler on 02/27/2019 09:29:39

## 2019-02-27 NOTE — Progress Notes (Addendum)
SHARETTA, HALLERAN ([id number]) Visit Report for 02/27/2019 Arrival Information Details Patient Name: [name], [name]. Date of Service: 02/27/2019 8:30 AM Medical Record Number: [id number] Patient Account Number: [id number] Date of Birth/Sex: [birth year] (63 y.o. F) Treating RN: Montey Hora Primary Care Marsha Gundlach: Tandy Gaw Other Clinician: Referring Samariah Hokenson: Tandy Gaw Treating Darcella Shiffman/Extender: STONE III, HOYT Weeks in Treatment: 1 Visit Information History Since Last Visit Added or deleted any medications: No Patient Arrived: Cane Any new allergies or adverse reactions: No Arrival Time: 08:32 Had a fall or experienced change in No Accompanied By: self activities of daily living that may affect Transfer Assistance: None risk of falls: Patient Identification Verified: Yes Signs or symptoms of abuse/neglect since last visito No Secondary Verification Process Completed: Yes Hospitalized since last visit: No Implantable device outside of the clinic excluding No cellular tissue based products placed in the center since last visit: Has Dressing in Place as Prescribed: Yes Pain Present Now: No Electronic Signature(s) Signed: 02/27/2019 3:59:19 PM By: Lorine Bears RCP, RRT, CHT Entered By: Lorine Bears on 02/27/2019 08:33:16 [name], [name] ([id number]) -------------------------------------------------------------------------------- Clinic Level of Care Assessment Details Patient Name: [name]. Date of Service: 02/27/2019 8:30 AM Medical Record Number: [id number] Patient Account Number: [id number] Date of Birth/Sex: [birth year] (63 y.o. F) Treating RN: Montey Hora Primary Care Rod Majerus: Tandy Gaw Other Clinician: Referring Annette Liotta: Tandy Gaw Treating Katelee Schupp/Extender: STONE III, HOYT Weeks in Treatment: 1 Clinic Level of Care Assessment Items TOOL 4 Quantity Score []  - Use when only an EandM is performed on FOLLOW-UP  visit 0 ASSESSMENTS - Nursing Assessment / Reassessment X - Reassessment of Co-morbidities (includes updates in patient status) 1 10 X- 1 5 Reassessment of Adherence to Treatment Plan ASSESSMENTS - Wound and Skin Assessment / Reassessment []  - Simple Wound Assessment / Reassessment - one wound 0 X- 2 5 Complex Wound Assessment / Reassessment - multiple wounds []  - 0 Dermatologic / Skin Assessment (not related to wound area) ASSESSMENTS - Focused Assessment X - Circumferential Edema Measurements - multi extremities 1 5 []  - 0 Nutritional Assessment / Counseling / Intervention X- 1 5 Lower Extremity Assessment (monofilament, tuning fork, pulses) []  - 0 Peripheral Arterial Disease Assessment (using hand held doppler) ASSESSMENTS - Ostomy and/or Continence Assessment and Care []  - Incontinence Assessment and Management 0 []  - 0 Ostomy Care Assessment and Management (repouching, etc.) PROCESS - Coordination of Care X - Simple Patient / Family Education for ongoing care 1 15 []  - 0 Complex (extensive) Patient / Family Education for ongoing care X- 1 10 Staff obtains Programmer, systems, Records, Test Results / Process Orders []  - 0 Staff telephones HHA, Nursing Homes / Clarify orders / etc []  - 0 Routine Transfer to another Facility (non-emergent condition) []  - 0 Routine Hospital Admission (non-emergent condition) []  - 0 New Admissions / Biomedical engineer / Ordering NPWT, Apligraf, etc. []  - 0 Emergency Hospital Admission (emergent condition) X- 1 10 Simple Discharge Coordination [name], NEISS. ([id number]) []  - 0 Complex (extensive) Discharge Coordination PROCESS - Special Needs []  - Pediatric / Minor Patient Management 0 []  - 0 Isolation Patient Management []  - 0 Hearing / Language / Visual special needs []  - 0 Assessment of Community assistance (transportation, D/C planning, etc.) []  - 0 Additional assistance / Altered mentation []  - 0 Support Surface(s) Assessment  (bed, cushion, seat, etc.) INTERVENTIONS - Wound Cleansing / Measurement []  - Simple Wound Cleansing - one wound 0 X- 2 5 Complex Wound Cleansing - multiple wounds X-  1 5 Wound Imaging (photographs - any number of wounds) []  - 0 Wound Tracing (instead of photographs) []  - 0 Simple Wound Measurement - one wound X- 2 5 Complex Wound Measurement - multiple wounds INTERVENTIONS - Wound Dressings X - Small Wound Dressing one or multiple wounds 1 10 []  - 0 Medium Wound Dressing one or multiple wounds X- 1 20 Large Wound Dressing one or multiple wounds X- 1 5 Application of Medications - topical []  - 0 Application of Medications - injection INTERVENTIONS - Miscellaneous []  - External ear exam 0 []  - 0 Specimen Collection (cultures, biopsies, blood, body fluids, etc.) []  - 0 Specimen(s) / Culture(s) sent or taken to Lab for analysis []  - 0 Patient Transfer (multiple staff / Civil Service fast streamer / Similar devices) []  - 0 Simple Staple / Suture removal (25 or less) []  - 0 Complex Staple / Suture removal (26 or more) []  - 0 Hypo / Hyperglycemic Management (close monitor of Blood Glucose) []  - 0 Ankle / Brachial Index (ABI) - do not check if billed separately X- 1 5 Vital Signs [name], [name] ([id number]) Has the patient been seen at the hospital within the last three years: Yes Total Score: 135 Level Of Care: New/Established - Level 4 Electronic Signature(s) Signed: 02/27/2019 4:48:17 PM By: Montey Hora Entered By: Montey Hora on 02/27/2019 09:07:30 [name], [name] ([id number]) -------------------------------------------------------------------------------- Encounter Discharge Information Details Patient Name: [name]. Date of Service: 02/27/2019 8:30 AM Medical Record Number: [id number] Patient Account Number: [id number] Date of Birth/Sex: February 03, 1956 (63 y.o. F) Treating RN: Montey Hora Primary Care Sandria Mcenroe: Tandy Gaw Other Clinician: Referring Shakari Qazi:  Tandy Gaw Treating Saddie Sandeen/Extender: Melburn Hake, HOYT Weeks in Treatment: 1 Encounter Discharge Information Items Discharge Condition: Stable Ambulatory Status: Cane Discharge Destination: Home Transportation: Private Auto Accompanied By: self Schedule Follow-up Appointment: Yes Clinical Summary of Care: Electronic Signature(s) Signed: 02/27/2019 4:48:17 PM By: Montey Hora Entered By: Montey Hora on 02/27/2019 09:08:50 Zangara, [name] ([id number]) -------------------------------------------------------------------------------- Lower Extremity Assessment Details Patient Name: [name]. Date of Service: 02/27/2019 8:30 AM Medical Record Number: [id number] Patient Account Number: [id number] Date of Birth/Sex: 20-Oct-1955 (63 y.o. F) Treating RN: Harold Barban Primary Care Samoria Fedorko: Tandy Gaw Other Clinician: Referring Fatisha Rabalais: Tandy Gaw Treating Jensen Cheramie/Extender: STONE III, HOYT Weeks in Treatment: 1 Edema Assessment Assessed: [Left: No] [Right: No] Edema: [Left: Yes] [Right: Yes] Notes Due to patient's size and extent of swelling measurement were not taken. Electronic Signature(s) Signed: 02/27/2019 4:09:28 PM By: Harold Barban Entered By: Harold Barban on 02/27/2019 08:46:05 Bufkin, [name] ([id number]) -------------------------------------------------------------------------------- Multi Wound Chart Details Patient Name: Elizabeth Landau C. Date of Service: 02/27/2019 8:30 AM Medical Record Number: [id number] Patient Account Number: [id number] Date of Birth/Sex: 06-Nov-1955 (63 y.o. F) Treating RN: Montey Hora Primary Care Jalysa Swopes: Tandy Gaw Other Clinician: Referring Krystofer Hevener: Tandy Gaw Treating Idelia Caudell/Extender: STONE III, HOYT Weeks in Treatment: 1 Vital Signs Height(in): 62 Pulse(bpm): 74 Weight(lbs): 390 Blood Pressure(mmHg): 110/79 Body Mass Index(BMI): 71 Temperature(F): 98.9 Respiratory  Rate 16 (breaths/min): Photos: [N/A:N/A] Wound Location: Left Lower Leg - Medial Right Lower Leg - Medial N/A Wounding Event: Blister Gradually Appeared N/A Primary Etiology: Lymphedema Lymphedema N/A Comorbid History: Cataracts, Lymphedema, Cataracts, Lymphedema, N/A Asthma, Sleep Apnea, Asthma, Sleep Apnea, Tuberculosis, Hypertension, Tuberculosis, Hypertension, Gout, Osteoarthritis Gout, Osteoarthritis Date Acquired: 10/08/2018 02/02/2019 N/A Weeks of Treatment: 1 1 N/A Wound Status: Open Open N/A Measurements L x W x D 6x6x0.1 4x12x0.1 N/A (cm) Area (cm) : 28.274 37.699 N/A Volume (cm) : 2.827 3.77 N/A %  Reduction in Area: 4.80% 0.00% N/A % Reduction in Volume: 4.80% 0.00% N/A Classification: Full Thickness Without Full Thickness Without N/A Exposed Support Structures Exposed Support Structures Exudate Amount: Medium Large N/A Exudate Type: Serous Serous N/A Exudate Color: amber amber N/A Wound Margin: Thickened Indistinct, nonvisible N/A Granulation Amount: Medium (34-66%) Medium (34-66%) N/A Granulation Quality: Red Pink N/A Necrotic Amount: Medium (34-66%) Medium (34-66%) N/A Exposed Structures: Fat Layer (Subcutaneous Fat Layer (Subcutaneous N/A Tissue) Exposed: Yes Tissue) Exposed: Yes Fascia: No Fascia: No Tendon: No Tendon: No [name], [name] ([id number]) Muscle: No Muscle: No Joint: No Joint: No Bone: No Bone: No Epithelialization: None Medium (34-66%) N/A Treatment Notes Electronic Signature(s) Signed: 02/27/2019 4:48:17 PM By: Montey Hora Entered By: Montey Hora on 02/27/2019 09:02:44 Spirito, [name] ([id number]) -------------------------------------------------------------------------------- Multi-Disciplinary Care Plan Details Patient Name: [name]. Date of Service: 02/27/2019 8:30 AM Medical Record Number: [id number] Patient Account Number: [id number] Date of Birth/Sex: Dec 25, 1955 (63 y.o. F) Treating RN: Montey Hora Primary Care  [place]ar[place] Diana: Tandy Gaw Other Clinician: Referring Imberly Troxler: Tandy Gaw Treating Amahia Madonia/Extender: STONE III, HOYT Weeks in Treatment: 1 Active Inactive Abuse / Safety / Falls / Self Care Management Nursing Diagnoses: Potential for falls Goals: Patient will remain injury free related to falls Date Initiated: 02/20/2019 Target Resolution Date: 05/16/2019 Goal Status: Active Interventions: Assess fall risk on admission and as needed Notes: Orientation to the Wound Care Program Nursing Diagnoses: Knowledge deficit related to the wound healing center program Goals: Patient/caregiver will verbalize understanding of the [place] Program Date Initiated: 02/20/2019 Target Resolution Date: 05/16/2019 Goal Status: Active Interventions: Provide education on orientation to the wound center Notes: Venous Leg Ulcer Nursing Diagnoses: Actual venous Insuffiency (use after diagnosis is confirmed) Goals: Patient will maintain optimal edema control Date Initiated: 02/20/2019 Target Resolution Date: 05/16/2019 Goal Status: Active Interventions: Assess peripheral edema status every visit. [name], [name] ([id number]) Compression as ordered Notes: Wound/Skin Impairment Nursing Diagnoses: Impaired tissue integrity Goals: Ulcer/skin breakdown will heal within 14 weeks Date Initiated: 02/20/2019 Target Resolution Date: 05/16/2019 Goal Status: Active Interventions: Assess patient/caregiver ability to obtain necessary supplies Assess patient/caregiver ability to perform ulcer/skin care regimen upon admission and as needed Assess ulceration(s) every visit Notes: Electronic Signature(s) Signed: 02/27/2019 4:48:17 PM By: Montey Hora Entered By: Montey Hora on 02/27/2019 09:02:17 [place], [name] ([id number]) -------------------------------------------------------------------------------- Pain Assessment Details Patient Name: [name]. Date of Service: 02/27/2019  8:30 AM Medical Record Number: [id number] Patient Account Number: [id number] Date of Birth/Sex: September 02, 1955 (63 y.o. F) Treating RN: Montey Hora Primary Care Binnie Droessler: Tandy Gaw Other Clinician: Referring Ojani Berenson: Tandy Gaw Treating Jameela Michna/Extender: STONE III, HOYT Weeks in Treatment: 1 Active Problems Location of Pain Severity and Description of Pain Patient Has Paino No Site Locations Pain Management and Medication Current Pain Management: Electronic Signature(s) Signed: 02/27/2019 3:59:19 PM By: Paulla Fore, RRT, CHT Signed: 02/27/2019 4:48:17 PM By: Montey Hora Entered By: Lorine Bears on 02/27/2019 08:33:25 [name], [name] ([id number]) -------------------------------------------------------------------------------- Patient/Caregiver Education Details Patient Name: [name]. Date of Service: 02/27/2019 8:30 AM Medical Record Number: [id number] Patient Account Number: [id number] Date of Birth/Gender: [birth year] (63 y.o. F) Treating RN: Montey Hora Primary Care Physician: Tandy Gaw Other Clinician: Referring Physician: Tandy Gaw Treating Physician/Extender: Melburn Hake, HOYT Weeks in Treatment: 1 Education Assessment Education Provided To: Patient Education Topics Provided Venous: Handouts: Other: need for edema control Methods: Explain/Verbal Responses: State content correctly Electronic Signature(s) Signed: 02/27/2019 4:48:17 PM By: Montey Hora Entered By: Montey Hora on 02/27/2019 09:07:56  [name], [name] ([id number]) -------------------------------------------------------------------------------- Wound Assessment Details Patient Name: [name], LOYE. Date of Service: 02/27/2019 8:30 AM Medical Record Number: [id number] Patient Account Number: [id number] Date of Birth/Sex: 1955-10-12 (63 y.o. F) Treating RN: Harold Barban Primary Care Sheriden Archibeque: Tandy Gaw Other Clinician: Referring  Lasondra Hodgkins: Tandy Gaw Treating Natacha Jepsen/Extender: STONE III, HOYT Weeks in Treatment: 1 Wound Status Wound Number: 3 Primary Lymphedema Etiology: Wound Location: Left Lower Leg - Medial Wound Open Wounding Event: Blister Status: Date Acquired: 10/08/2018 Comorbid Cataracts, Lymphedema, Asthma, Sleep Weeks Of Treatment: 1 History: Apnea, Tuberculosis, Hypertension, Gout, Clustered Wound: No Osteoarthritis Photos Wound Measurements Length: (cm) 6 Width: (cm) 6 Depth: (cm) 0.1 Area: (cm) 28.274 Volume: (cm) 2.827 % Reduction in Area: 4.8% % Reduction in Volume: 4.8% Epithelialization: None Tunneling: No Undermining: No Wound Description Full Thickness Without Exposed Support Foul Odo Classification: Structures Slough/F Wound Margin: Thickened Exudate Medium Amount: Exudate Type: Serous Exudate Color: amber r After Cleansing: No ibrino Yes Wound Bed Granulation Amount: Medium (34-66%) Exposed Structure Granulation Quality: Red Fascia Exposed: No Necrotic Amount: Medium (34-66%) Fat Layer (Subcutaneous Tissue) Exposed: Yes Necrotic Quality: Adherent Slough Tendon Exposed: No Muscle Exposed: No Joint Exposed: No Bone Exposed: No [name], [name] ([id number]) Treatment Notes Wound #3 (Left, Medial Lower Leg) Notes nystatin powder, silvercel, abd, kerlix and conform Electronic Signature(s) Signed: 02/27/2019 4:09:28 PM By: Harold Barban Entered By: Harold Barban on 02/27/2019 08:46:59 Weld, [name] ([id number]) -------------------------------------------------------------------------------- Wound Assessment Details Patient Name: Elizabeth Landau C. Date of Service: 02/27/2019 8:30 AM Medical Record Number: [id number] Patient Account Number: [id number] Date of Birth/Sex: 1955/08/05 (63 y.o. F) Treating RN: Harold Barban Primary Care Konstantine Gervasi: Tandy Gaw Other Clinician: Referring Davidjames Blansett: Tandy Gaw Treating Tanea Moga/Extender: STONE III,  HOYT Weeks in Treatment: 1 Wound Status Wound Number: 4 Primary Lymphedema Etiology: Wound Location: Right Lower Leg - Medial Wound Open Wounding Event: Gradually Appeared Status: Date Acquired: 02/02/2019 Comorbid Cataracts, Lymphedema, Asthma, Sleep Weeks Of Treatment: 1 History: Apnea, Tuberculosis, Hypertension, Gout, Clustered Wound: No Osteoarthritis Photos Wound Measurements Length: (cm) 4 Width: (cm) 12 Depth: (cm) 0.1 Area: (cm) 37.699 Volume: (cm) 3.77 % Reduction in Area: 0% % Reduction in Volume: 0% Epithelialization: Medium (34-66%) Tunneling: No Undermining: No Wound Description Full Thickness Without Exposed Support Foul Odo Classification: Structures Slough/F Wound Margin: Indistinct, nonvisible Exudate Large Amount: Exudate Type: Serous Exudate Color: amber r After Cleansing: No ibrino Yes Wound Bed Granulation Amount: Medium (34-66%) Exposed Structure Granulation Quality: Pink Fascia Exposed: No Necrotic Amount: Medium (34-66%) Fat Layer (Subcutaneous Tissue) Exposed: Yes Necrotic Quality: Adherent Slough Tendon Exposed: No Muscle Exposed: No Joint Exposed: No Bone Exposed: No [name], [name] ([id number]) Treatment Notes Wound #4 (Right, Medial Lower Leg) Notes nystatin powder, silvercel, abd, kerlix and conform Electronic Signature(s) Signed: 02/27/2019 4:09:28 PM By: Harold Barban Entered By: Harold Barban on 02/27/2019 08:47:30 Kostick, [name] ([id number]) -------------------------------------------------------------------------------- Vitals Details Patient Name: [name]. Date of Service: 02/27/2019 8:30 AM Medical Record Number: [id number] Patient Account Number: [id number] Date of Birth/Sex: 10-Oct-1955 (63 y.o. F) Treating RN: Montey Hora Primary Care Charonda Hefter: Tandy Gaw Other Clinician: Referring Linkon Siverson: Tandy Gaw Treating Minnah Llamas/Extender: STONE III, HOYT Weeks in Treatment: 1 Vital Signs Time  Taken: 08:34 Temperature (F): 98.9 Height (in): 62 Pulse (bpm): 74 Weight (lbs): 390 Respiratory Rate (breaths/min): 16 Body Mass Index (BMI): 71.3 Blood Pressure (mmHg): 110/79 Reference Range: 80 - 120 mg / dl Electronic Signature(s) Signed: 02/27/2019 3:59:19 PM By: Becky Sax, Sallie RCP, RRT, CHT Entered By: Lorine Bears on  02/27/2019 08:36:51 

## 2019-03-06 ENCOUNTER — Other Ambulatory Visit: Payer: Self-pay

## 2019-03-06 ENCOUNTER — Encounter: Payer: Medicaid Other | Admitting: Physician Assistant

## 2019-03-06 DIAGNOSIS — I890 Lymphedema, not elsewhere classified: Secondary | ICD-10-CM | POA: Diagnosis not present

## 2019-03-06 NOTE — Progress Notes (Addendum)
TELLY, CARRETO ([id number]) Visit Report for 03/06/2019 Chief Complaint Document Details Patient Name: [name], [name]. Date of Service: 03/06/2019 9:00 AM Medical Record Number: [id number] Patient Account Number: [id number] Date of Birth/Sex: 08/09/1955 (63 y.o. F) Treating RN: Montey Hora Primary Care Provider: Tandy Gaw Other Clinician: Referring Provider: Tandy Gaw Treating Provider/Extender: Melburn Hake, HOYT Weeks in Treatment: 2 Information Obtained from: Patient Chief Complaint Left LE Ulcer Electronic Signature(s) Signed: 03/06/2019 9:13:27 AM By: Worthy Keeler PA-[name] Entered By: Worthy Keeler on 03/06/2019 09:13:27 [name], [name] ([id number]) -------------------------------------------------------------------------------- HPI Details Patient Name: [name]. Date of Service: 03/06/2019 9:00 AM Medical Record Number: [id number] Patient Account Number: [id number] Date of Birth/Sex: [birth year] (63 y.o. F) Treating RN: Montey Hora Primary Care Provider: Tandy Gaw Other Clinician: Referring Provider: Tandy Gaw Treating Provider/Extender: STONE III, HOYT Weeks in Treatment: 2 History of Present Illness HPI Description: The patient is a 63 year old female with history of hypertension and a long-standing history of bilateral lower extremity lymphedema (first presented on 4/2) . She has had open ulcers in the past which have always responded to compression therapy. She had briefly been to a lymphedema clinic in the past which helped her at the time. this time around she stopped treatment of her lymphedema pumps approximately 2 weeks ago because of some pain in the knees and then noticed the right leg getting worse. She was seen by her PCP who put her on clindamycin 4 times a day 2 days ago. The patient has seen [facility] and Dr. Delana Meyer had seen her last year where a vascular study including venous and arterial duplex studies were within normal limits. he  had recommended compression stockings and lymphedema pumps and the patient has been using this in about 2 weeks ago. She is known to be diabetic but in the past few time she's gone to her primary care doctor her hemoglobin A1c has been normal. 02/11/2015 - after her last visit she took my advice and went to the ER regarding the progressive cellulitis of her right lower extremity and she was admitted between July 17 and 22nd. She received IV antibiotics and then was sent home on a course of steroid-induced and oral antibiotics. She has improved much since then. 02/17/2015 -- she has been doing fine and the weeping of her legs has remarkably gone down. She has no fresh issues. READMISSION 01/15/18 This patient was given this clinic before most recently in 2016 seen by Dr. Con Memos. She has massive bilateral lymphedema and over the last 2 months this had weeping edema out of the left leg. She has compression pumps but her compliance with these has been minimal. She has [facility] they've been using TCA/ABDs/kerlix under an Ace wrap.she has had recent problems with cellulitis. She was apparently seen in the ER and 12/23/17 and given clindamycin. She was then followed by her primary doctor and given doxycycline and Keflex. The pain seems to have settled down. In April 2018 the patient had arterial studies done at [facility]. This showed triphasic waveforms throughout the right leg and mostly triphasic waveforms on the left except for monophasic at the posterior tibial artery distally. She was not felt to have evidence of right lower extremity arterial stenosis or significant problems on the left side. 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 She also had a right lower extremity venous Doppler in January 2018 this was limited by the patient's body habitus and lymphedema. Most of the  proximal veins were not visualized The patient presents with an area of  denuded skin on the anterior medial part of the left calf. There is weeping edema fluid here. 01/22/18; the patient has somewhat better edema control using her compression pumps twice a day and as a result she has much better epithelialization on the left anterior calf area. Only a small open area remains. 01/29/18; the patient has been compliant with her compression pumps. Both the areas on her calf that healed. The remaining area on the left anterior leg is fully epithelialized Readmission: 02/20/2019 upon evaluation today patient presents for reevaluation due to issues that she is having with the bilateral lower extremities. She actually has wounds open on both legs. On the right she has an area in the crease of her leg on the right around the knee region which is actually draining quite a bit and actually has some fungal type appearance to it. She has been on nystatin powder that seems to have helped to some degree. In regard to the left lower extremity this is actually in the [place]. ([id number]) lower portion of her leg closer to the ankle and again is continuing to drain as well unfortunately. There does not appear to be any signs of active infection at this time which is good news. No fevers, chills, nausea, vomiting, or diarrhea. She tells me that since she was seen last year she is actually been doing quite well for the most part with regard to her lower extremities. Unfortunately she now is experiencing a little bit more drainage at this time. She is concerned about getting this under control so that it does not get significantly worse. 02/27/2019 on evaluation today patient appears to be doing somewhat better in regard to her bilateral lower extremity wounds. She has been tolerating the dressing changes without complication. Fortunately there is no signs of active infection at this point. No fevers, chills, nausea, vomiting, or diarrhea. She did get her dressing supplies which is  excellent news she was extremely excited to get these. She also got paperwork from [employer] for their financial assistance program where they may be able to help her out in the future if needed with supplies at discounted prices. 03/06/2019 on evaluation today patient appears to be doing a little worse with regard to both areas of weeping on her bilateral lower extremities. This is around the right medial knee and just above the left ankle. With that being said she is unfortunately not doing as well as I would like to see. I feel like she may need to potentially go see someone at the lymphedema clinic as the wraps that she needs or even beyond what we can do here at the wound care center. She really does not have wounds she just has open areas of weeping that are causing some difficulty for her. Subsequently because of this and the moisture I am concerned about the potential for infection I am going to likely give her a prophylactic antibiotic today, Keflex, just to be on the safe side. Nonetheless again there is no obvious signs of active infection at this time. Electronic Signature(s) Signed: 03/06/2019 9:23:23 AM By: Worthy Keeler PA-[name] Entered By: Worthy Keeler on 03/06/2019 09:23:23 [name] ([id number]) -------------------------------------------------------------------------------- Physical Exam Details Patient Name: [name], [name]. Date of Service: 03/06/2019 9:00 AM Medical Record Number: [id number] Patient Account Number: [id number] Date of Birth/Sex: 1956/05/10 (63 y.o. F) Treating RN: Montey Hora Primary Care Provider: Tandy Gaw  Other Clinician: Referring Provider: Sharrell Ku, LINDA Treating Provider/Extender: STONE III, HOYT Weeks in Treatment: 2 Constitutional Well-nourished and well-hydrated in no acute distress. Respiratory normal breathing without difficulty. clear to auscultation bilaterally. Cardiovascular regular rate and rhythm with normal S1,  S2. Psychiatric this patient is able to make decisions and demonstrates good insight into disease process. Alert and Oriented x 3. pleasant and cooperative. Notes Patient's wound bed currently again is mainly just a irritated/weeping area of lymphedematous skin noted over the right medial knee/inner thigh region and the left lower extremity just above the ankle. Both locations are wet and weeping upon evaluation today but again neither are really truly wounds at this time. Again there is also no evidence of significant erythema with associated warmth to touch which is good news I am good to give her an antibiotic but again there is no evidence of direct infection right now I think this is more just prophylactic and preventative. Electronic Signature(s) Signed: 03/06/2019 9:24:14 AM By: Worthy Keeler PA-[name] Entered By: Worthy Keeler on 03/06/2019 09:24:13 Alan, [name] ([id number]) -------------------------------------------------------------------------------- Physician Orders Details Patient Name: [name] Date of Service: 03/06/2019 9:00 AM Medical Record Number: [id number] Patient Account Number: [id number] Date of Birth/Sex: 1956/03/29 (63 y.o. F) Treating RN: Montey Hora Primary Care Provider: Tandy Gaw Other Clinician: Referring Provider: Tandy Gaw Treating Provider/Extender: STONE III, HOYT Weeks in Treatment: 2 Verbal / Phone Orders: No Diagnosis Coding ICD-10 Coding Code Description I89.0 Lymphedema, not elsewhere classified L97.822 Non-pressure chronic ulcer of other part of left lower leg with fat layer exposed L97.812 Non-pressure chronic ulcer of other part of right lower leg with fat layer exposed Wound Cleansing Wound #3 Left,Medial Lower Leg o Dial antibacterial soap, wash wounds, rinse and pat dry prior to dressing wounds o May Shower, gently pat wound dry prior to applying new dressing. Wound #4 Right,Medial Lower Leg o Dial  antibacterial soap, wash wounds, rinse and pat dry prior to dressing wounds o May Shower, gently pat wound dry prior to applying new dressing. Skin Barriers/Peri-Wound Care Wound #4 Right,Medial Lower Leg o Antifungal powder-Nystatin Primary Wound Dressing Wound #3 Left,Medial Lower Leg o Silver Alginate Wound #4 Right,Medial Lower Leg o Silver Alginate Secondary Dressing Wound #3 Left,Medial Lower Leg o ABD pad Wound #4 Right,Medial Lower Leg o ABD pad Dressing Change Frequency Wound #3 Left,Medial Lower Leg o Change dressing every other day. Wound #4 Right,Medial Lower Leg o Change dressing every other day. Follow-up Appointments o Return Appointment in 1 week. [name], [name] ([id number]) Edema Control Wound #3 Left,Medial Lower Leg o Kerlix and Coban - Left Lower Extremity Services and Therapies o Lymphedema Clinic Patient Medications Allergies: ibuprofen, ACE Inhibitors Notifications Medication Indication Start End Keflex 03/06/2019 DOSE 1 - oral 500 mg capsule - 1 capsule oral taken 3 times a day for 10 days Electronic Signature(s) Signed: 03/06/2019 9:26:51 AM By: Worthy Keeler PA-[name] Entered By: Worthy Keeler on 03/06/2019 09:26:50 [name], [name] ([id number]) -------------------------------------------------------------------------------- Problem List Details Patient Name: [name]. Date of Service: 03/06/2019 9:00 AM Medical Record Number: [id number] Patient Account Number: [id number] Date of Birth/Sex: [birth year] (63 y.o. F) Treating RN: Montey Hora Primary Care Provider: Tandy Gaw Other Clinician: Referring Provider: Tandy Gaw Treating Provider/Extender: STONE III, HOYT Weeks in Treatment: 2 Active Problems ICD-10 Evaluated Encounter Code Description Active Date Today Diagnosis I89.0 Lymphedema, not elsewhere classified 02/20/2019 No Yes L97.822 Non-pressure chronic ulcer of other part of left lower leg with  02/20/2019 No Yes  fat layer exposed L97.812 Non-pressure chronic ulcer of other part of right lower leg 02/20/2019 No Yes with fat layer exposed Inactive Problems Resolved Problems Electronic Signature(s) Signed: 03/06/2019 9:13:12 AM By: Worthy Keeler PA-[name] Entered By: Worthy Keeler on 03/06/2019 Carlyle, [name] ([id number]) -------------------------------------------------------------------------------- Progress Note Details Patient Name: [name]. Date of Service: 03/06/2019 9:00 AM Medical Record Number: [id number] Patient Account Number: [id number] Date of Birth/Sex: 26-Sep-1955 (63 y.o. F) Treating RN: Montey Hora Primary Care Provider: Tandy Gaw Other Clinician: Referring Provider: Tandy Gaw Treating Provider/Extender: STONE III, HOYT Weeks in Treatment: 2 Subjective Chief Complaint Information obtained from Patient Left LE Ulcer History of Present Illness (HPI) The patient is a 63 year old female with history of hypertension and a long-standing history of bilateral lower extremity lymphedema (first presented on 4/2) . She has had open ulcers in the past which have always responded to compression therapy. She had briefly been to a lymphedema clinic in the past which helped her at the time. this time around she stopped treatment of her lymphedema pumps approximately 2 weeks ago because of some pain in the knees and then noticed the right leg getting worse. She was seen by her PCP who put her on clindamycin 4 times a day 2 days ago. The patient has seen [facility] and Dr. Delana Meyer had seen her last year where a vascular study including venous and arterial duplex studies were within normal limits. he had recommended compression stockings and lymphedema pumps and the patient has been using this in about 2 weeks ago. She is known to be diabetic but in the past few time she's gone to her primary care doctor her hemoglobin A1c has been normal. 02/11/2015 -  after her last visit she took my advice and went to the ER regarding the progressive cellulitis of her right lower extremity and she was admitted between July 17 and 22nd. She received IV antibiotics and then was sent home on a course of steroid-induced and oral antibiotics. She has improved much since then. 02/17/2015 -- she has been doing fine and the weeping of her legs has remarkably gone down. She has no fresh issues. READMISSION 01/15/18 This patient was given this clinic before most recently in 2016 seen by Dr. Con Memos. She has massive bilateral lymphedema and over the last 2 months this had weeping edema out of the left leg. She has compression pumps but her compliance with these has been minimal. She has [facility] they've been using TCA/ABDs/kerlix under an Ace wrap.she has had recent problems with cellulitis. She was apparently seen in the ER and 12/23/17 and given clindamycin. She was then followed by her primary doctor and given doxycycline and Keflex. The pain seems to have settled down. In April 2018 the patient had arterial studies done at [facility]. This showed triphasic waveforms throughout the right leg and mostly triphasic waveforms on the left except for monophasic at the posterior tibial artery distally. She was not felt to have evidence of right lower extremity arterial stenosis or significant problems on the left side. 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 She also had a right lower extremity venous Doppler in January 2018 this was limited by the patient's body habitus and lymphedema. Most of the proximal veins were not visualized The patient presents with an area of denuded skin on the anterior medial part of the left calf. There is weeping edema fluid here. 01/22/18; the patient has somewhat better edema  control using her compression pumps twice a day and as a result she has much better epithelialization on the left  anterior calf area. Only a small open area remains. 01/29/18; the patient has been compliant with her compression pumps. Both the areas on her calf that healed. The remaining area on the left anterior leg is fully epithelialized [name], [name]. ([id number]) Readmission: 02/20/2019 upon evaluation today patient presents for reevaluation due to issues that she is having with the bilateral lower extremities. She actually has wounds open on both legs. On the right she has an area in the crease of her leg on the right around the knee region which is actually draining quite a bit and actually has some fungal type appearance to it. She has been on nystatin powder that seems to have helped to some degree. In regard to the left lower extremity this is actually in the lower portion of her leg closer to the ankle and again is continuing to drain as well unfortunately. There does not appear to be any signs of active infection at this time which is good news. No fevers, chills, nausea, vomiting, or diarrhea. She tells me that since she was seen last year she is actually been doing quite well for the most part with regard to her lower extremities. Unfortunately she now is experiencing a little bit more drainage at this time. She is concerned about getting this under control so that it does not get significantly worse. 02/27/2019 on evaluation today patient appears to be doing somewhat better in regard to her bilateral lower extremity wounds. She has been tolerating the dressing changes without complication. Fortunately there is no signs of active infection at this point. No fevers, chills, nausea, vomiting, or diarrhea. She did get her dressing supplies which is excellent news she was extremely excited to get these. She also got paperwork from [employer] for their financial assistance program where they may be able to help her out in the future if needed with supplies at discounted prices. 03/06/2019 on evaluation  today patient appears to be doing a little worse with regard to both areas of weeping on her bilateral lower extremities. This is around the right medial knee and just above the left ankle. With that being said she is unfortunately not doing as well as I would like to see. I feel like she may need to potentially go see someone at the lymphedema clinic as the wraps that she needs or even beyond what we can do here at the wound care center. She really does not have wounds she just has open areas of weeping that are causing some difficulty for her. Subsequently because of this and the moisture I am concerned about the potential for infection I am going to likely give her a prophylactic antibiotic today, Keflex, just to be on the safe side. Nonetheless again there is no obvious signs of active infection at this time. Patient History Information obtained from Patient. Family History Cancer - Father, Diabetes - Father, Heart Disease - Mother, Hypertension - Mother,Father, Seizures - Mother, Stroke - Mother, Thyroid Problems - Mother,Siblings, No family history of Hereditary Spherocytosis, Kidney Disease, Lung Disease, Tuberculosis. Social History Former smoker - uses snuff, Marital Status - Single, Alcohol Use - Rarely, Drug Use - No History, Caffeine Use - Daily. 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 Denies history of Aspiration, Chronic Obstructive Pulmonary Disease (COPD), Pneumothorax Cardiovascular Patient has history of Hypertension Denies history of Angina, Arrhythmia,  Congestive Heart Failure, Coronary Artery Disease, Deep Vein Thrombosis, Hypotension, Myocardial Infarction, Peripheral Arterial Disease, Peripheral Venous Disease, Phlebitis, Vasculitis Gastrointestinal Denies history of Cirrhosis , Colitis, Crohn s, Hepatitis A, Hepatitis B, Hepatitis [name] Endocrine Denies history of Type I Diabetes, Type II Diabetes Genitourinary Denies history of End Stage Renal Disease Ginsberg, Blinda [name]. ([id number]) Immunological Denies history of Lupus Erythematosus, Raynaud s, Scleroderma Integumentary (Skin) Denies history of History of Burn, History of pressure wounds Musculoskeletal Patient has history of Gout, Osteoarthritis Denies history of Rheumatoid Arthritis, Osteomyelitis Neurologic Denies history of Dementia, Neuropathy, Quadriplegia, Paraplegia, Seizure Disorder Oncologic Denies history of Received Chemotherapy, Received Radiation Psychiatric Denies history of Anorexia/bulimia, Confinement Anxiety Review of Systems (ROS) Constitutional Symptoms (General Health) Denies complaints or symptoms of Fatigue, Fever, Chills, Marked Weight Change. Respiratory Denies complaints or symptoms of Chronic or frequent coughs, Shortness of Breath. Cardiovascular Complains or has symptoms of LE edema. Denies complaints or symptoms of Chest pain. Objective Constitutional Well-nourished and well-hydrated in no acute distress. Vitals Time Taken: 9:00 AM, Height: 62 in, Weight: 390 lbs, BMI: 71.3, Temperature: 98.6 F, Pulse: 94 bpm, Respiratory Rate: 16 breaths/min, Blood Pressure: 151/90 mmHg. Respiratory normal breathing without difficulty. clear to auscultation bilaterally. Cardiovascular regular rate and rhythm with normal S1, S2. Psychiatric this patient is able to make decisions and demonstrates good insight into disease process. Alert and Oriented x 3. pleasant and cooperative. General Notes: Patient's wound bed currently again is mainly just a  irritated/weeping area of lymphedematous skin noted over the right medial knee/inner thigh region and the left lower extremity just above the ankle. Both locations are wet and weeping upon evaluation today but again neither are really truly wounds at this time. Again there is also no evidence of significant erythema with associated warmth to touch which is good news I am good to give her an antibiotic but again there is no evidence of direct infection right now I think this is more just prophylactic and preventative. Integumentary (Hair, Skin) Wound #3 status is Open. Original cause of wound was Blister. The wound is located on the Left,Medial Lower Leg. The [name], [name] ([id number]) wound measures 9.5cm length x 13cm width x 0.1cm depth; 96.997cm^2 area and 9.7cm^3 volume. There is Fat Layer (Subcutaneous Tissue) Exposed exposed. There is no tunneling or undermining noted. There is a medium amount of serous drainage noted. The wound margin is thickened. There is medium (34-66%) red granulation within the wound bed. There is a medium (34-66%) amount of necrotic tissue within the wound bed including Adherent Slough. Wound #4 status is Open. Original cause of wound was Gradually Appeared. The wound is located on the Right,Medial Lower Leg. The wound measures 4cm length x 9cm width x 0.1cm depth; 28.274cm^2 area and 2.827cm^3 volume. There is Fat Layer (Subcutaneous Tissue) Exposed exposed. There is no tunneling or undermining noted. There is a large amount of serous drainage noted. The wound margin is indistinct and nonvisible. There is medium (34-66%) pink granulation within the wound bed. There is a medium (34-66%) amount of necrotic tissue within the wound bed including Adherent Slough. Assessment Active Problems ICD-10 Lymphedema, not elsewhere classified Non-pressure chronic ulcer of other part of left lower leg with fat  layer exposed Non-pressure chronic ulcer of other part of right  lower leg with fat layer exposed Plan Wound Cleansing: Wound #3 Left,Medial Lower Leg: Dial antibacterial soap, wash wounds, rinse and pat dry prior to dressing wounds May Shower, gently pat wound dry prior to applying new dressing. Wound #4 Right,Medial Lower Leg: Dial antibacterial soap, wash wounds, rinse and pat dry prior to dressing wounds May Shower, gently pat wound dry prior to applying new dressing. Skin Barriers/Peri-Wound Care: Wound #4 Right,Medial Lower Leg: Antifungal powder-Nystatin Primary Wound Dressing: Wound #3 Left,Medial Lower Leg: Silver Alginate Wound #4 Right,Medial Lower Leg: Silver Alginate Secondary Dressing: Wound #3 Left,Medial Lower Leg: ABD pad Wound #4 Right,Medial Lower Leg: ABD pad Dressing Change Frequency: Wound #3 Left,Medial Lower Leg: Change dressing every other day. Wound #4 Right,Medial Lower Leg: Change dressing every other day. Follow-up Appointments: Return Appointment in 1 week. Edema Control: [name], [name] ([id number]) Wound #3 Left,Medial Lower Leg: Kerlix and Coban - Left Lower Extremity Services and Therapies ordered were: Lymphedema Clinic The following medication(s) was prescribed: Keflex oral 500 mg capsule 1 1 capsule oral taken 3 times a day for 10 days starting 03/06/2019 1. I would recommend that we go ahead and continue with the wraps as best as we can here in the office today. She is in agreement with this plan. 2. We will continue with the nystatin powder along with the alginate dressings which have done okay up until this week. Nonetheless I think she may need more specific and intentional wrapping for her lymphedema for that reason we can refer her to lymphedema clinic. 3. I am also going to send in a prescription for Keflex for her again this is prophylactically to help prevent infection just due to the fact that she is having more weeping I do not want her to end up with a more significant infection and end up  in the hospital. 4. I do think that it would be beneficial for the patient to be seen at the lymphedema clinic ASAP. Hopefully they can do more effective wrapping for her to try to get the overall lymphedema of the lower extremities under better control which I think will resolve the weeping that were seeing currently. We will see patient back for reevaluation in 1 week here in the clinic. If anything worsens or changes patient will contact our office for additional recommendations. Electronic Signature(s) Signed: 03/06/2019 9:27:36 AM By: Worthy Keeler PA-[name] Entered By: Worthy Keeler on 03/06/2019 09:27:36 [name], [name] ([id number]) -------------------------------------------------------------------------------- ROS/PFSH Details Patient Name: [name] Date of Service: 03/06/2019 9:00 AM Medical Record Number: [id number] Patient Account Number: [id number] Date of Birth/Sex: Aug 18, 1955 (63 y.o. 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 Negative for: Aspiration; Chronic Obstructive Pulmonary Disease (COPD); Pneumothorax Cardiovascular Complaints and Symptoms: Positive for: LE edema Negative for: Chest pain Medical History: Positive for: Hypertension Negative for: Angina; Arrhythmia; Congestive Heart Failure; Coronary Artery Disease; Deep Vein Thrombosis; Hypotension; Myocardial Infarction; Peripheral Arterial Disease; Peripheral Venous Disease; Phlebitis;  Vasculitis Eyes Medical History: Positive for: Cataracts Negative for: Glaucoma; Optic Neuritis Ear/Nose/Mouth/Throat Medical History: Negative  for: Chronic sinus problems/congestion; Middle ear problems Hematologic/Lymphatic Medical History: Positive for: Lymphedema Negative for: Anemia; Hemophilia; Human Immunodeficiency Virus; Sickle Cell Disease Gastrointestinal Medical History: Negative for: Cirrhosis ; Colitis; Crohnos; Hepatitis A; Hepatitis B; Hepatitis [name] [name], [name]. ([id number]) Endocrine Medical History: Negative for: Type I Diabetes; Type II Diabetes Genitourinary Medical History: Negative for: End Stage Renal Disease Immunological Medical History: Negative for: Lupus Erythematosus; Raynaudos; Scleroderma Integumentary (Skin) Medical History: Negative for: History of Burn; History of pressure wounds Musculoskeletal Medical History: Positive for: Gout; Osteoarthritis Negative for: Rheumatoid Arthritis; Osteomyelitis Neurologic Medical History: Negative for: Dementia; Neuropathy; Quadriplegia; Paraplegia; Seizure Disorder Oncologic Medical History: Negative for: Received Chemotherapy; Received Radiation Psychiatric Medical History: Negative for: Anorexia/bulimia; Confinement Anxiety HBO Extended History Items Eyes: Cataracts Immunizations Pneumococcal Vaccine: Received Pneumococcal Vaccination: Yes Implantable Devices No devices added Family and Social History Cancer: Yes - Father; Diabetes: Yes - Father; Heart Disease: Yes - Mother; Hereditary Spherocytosis: No; Hypertension: Yes - Mother,Father; Kidney Disease: No; Lung Disease: No; Seizures: Yes - Mother; Stroke: Yes - Mother; Thyroid Problems: Yes - Mother,Siblings; Tuberculosis: No; Former smoker - uses snuff; Marital Status - Single; Alcohol Use: Rarely; Drug Use: No History; Caffeine Use: Daily; Financial Concerns: No; Food, Clothing or Shelter Needs: No; Support System Lacking:  No; Transportation Concerns: No Physician [place], [place]. ([id number]) I have reviewed and agree with the above information. Electronic Signature(s) Signed: 03/06/2019 1:01:55 PM By: Montey Hora Signed: 03/08/2019 6:59:38 PM By: Worthy Keeler PA-[name] Entered By: Worthy Keeler on 03/06/2019 09:23:51 [name], [name] ([id number]) -------------------------------------------------------------------------------- SuperBill Details Patient Name: [name]. Date of Service: 03/06/2019 Medical Record Number: [id number] Patient Account Number: [id number] Date of Birth/Sex: [birth year] (63 y.o. F) Treating RN: Montey Hora Primary Care Provider: Tandy Gaw Other Clinician: Referring Provider: Tandy Gaw Treating Provider/Extender: STONE III, HOYT Weeks in Treatment: 2 Diagnosis Coding ICD-10 Codes Code Description I89.0 Lymphedema, not elsewhere classified L97.822 Non-pressure chronic ulcer of other part of left lower leg with fat layer exposed L97.812 Non-pressure chronic ulcer of other part of right lower leg with fat layer exposed Facility Procedures CPT4 Code: [id number] Description: 99214 - WOUND CARE VISIT-LEV 4 EST PT Modifier: Quantity: 1 Physician Procedures CPT4 Code Description: [id number] 99214 - WC PHYS LEVEL 4 - EST PT ICD-10 Diagnosis Description I89.0 Lymphedema, not elsewhere classified L97.822 Non-pressure chronic ulcer of other part of left lower leg wit L97.812 Non-pressure chronic ulcer of other  part of right lower leg wi Modifier: h fat layer expos th fat layer expo Quantity: 1 ed sed Electronic Signature(s) Signed: 03/06/2019 9:38:39 AM By: Montey Hora Signed: 03/08/2019 6:59:38 PM By: Worthy Keeler PA-[name] Previous Signature: 03/06/2019 9:27:53 AM Version By: Worthy Keeler PA-[name] Entered By: Montey Hora on 03/06/2019 09:38:39

## 2019-03-06 NOTE — Progress Notes (Signed)
[name], [name] ([id number]) Visit Report for 03/06/2019 Arrival Information Details Patient Name: [name], [name]. Date of Service: 03/06/2019 9:00 AM Medical Record Number: [id number] Patient Account Number: [id number] Date of Birth/Sex: [birth year] (63 y.o. F) Treating RN: Army Melia Primary Care Cherae Marton: Tandy Gaw Other Clinician: Referring Anish Vana: Tandy Gaw Treating Philena Obey/Extender: STONE III, HOYT Weeks in Treatment: 2 Visit Information History Since Last Visit Added or deleted any medications: No Patient Arrived: Cane Any new allergies or adverse reactions: No Arrival Time: 08:59 Had a fall or experienced change in No Accompanied By: self activities of daily living that may affect Transfer Assistance: None risk of falls: Signs or symptoms of abuse/neglect since last visito No Hospitalized since last visit: No Has Dressing in Place as Prescribed: Yes Pain Present Now: No Electronic Signature(s) Signed: 03/06/2019 9:37:06 AM By: Army Melia Entered By: Army Melia on 03/06/2019 09:00:07 [name] ([id number]) -------------------------------------------------------------------------------- Clinic Level of Care Assessment Details Patient Name: [name]. Date of Service: 03/06/2019 9:00 AM Medical Record Number: [id number] Patient Account Number: [id number] Date of Birth/Sex: June 29, 1956 (63 y.o. F) Treating RN: Montey Hora Primary Care Doctor Sheahan: Tandy Gaw Other Clinician: Referring Konnor Vondrasek: Tandy Gaw Treating Yaron Grasse/Extender: STONE III, HOYT Weeks in Treatment: 2 Clinic Level of Care Assessment Items TOOL 4 Quantity Score []  - Use when only an EandM is performed on FOLLOW-UP visit 0 ASSESSMENTS - Nursing Assessment / Reassessment X - Reassessment of Co-morbidities (includes updates in patient status) 1 10 X- 1 5 Reassessment of Adherence to Treatment Plan ASSESSMENTS - Wound and Skin Assessment / Reassessment []  - Simple Wound  Assessment / Reassessment - one wound 0 X- 2 5 Complex Wound Assessment / Reassessment - multiple wounds []  - 0 Dermatologic / Skin Assessment (not related to wound area) ASSESSMENTS - Focused Assessment []  - Circumferential Edema Measurements - multi extremities 0 []  - 0 Nutritional Assessment / Counseling / Intervention X- 1 5 Lower Extremity Assessment (monofilament, tuning fork, pulses) []  - 0 Peripheral Arterial Disease Assessment (using hand held doppler) ASSESSMENTS - Ostomy and/or Continence Assessment and Care []  - Incontinence Assessment and Management 0 []  - 0 Ostomy Care Assessment and Management (repouching, etc.) PROCESS - Coordination of Care X - Simple Patient / Family Education for ongoing care 1 15 []  - 0 Complex (extensive) Patient / Family Education for ongoing care X- 1 10 Staff obtains Programmer, systems, Records, Test Results / Process Orders []  - 0 Staff telephones HHA, Nursing Homes / Clarify orders / etc []  - 0 Routine Transfer to another Facility (non-emergent condition) []  - 0 Routine Hospital Admission (non-emergent condition) []  - 0 New Admissions / Biomedical engineer / Ordering NPWT, Apligraf, etc. []  - 0 Emergency Hospital Admission (emergent condition) X- 1 10 Simple Discharge Coordination MERVA, M[place]. ([id number]) []  - 0 Complex (extensive) Discharge Coordination PROCESS - Special Needs []  - Pediatric / Minor Patient Management 0 []  - 0 Isolation Patient Management []  - 0 Hearing / Language / Visual special needs []  - 0 Assessment of Community assistance (transportation, D/C planning, etc.) []  - 0 Additional assistance / Altered mentation []  - 0 Support Surface(s) Assessment (bed, cushion, seat, etc.) INTERVENTIONS - Wound Cleansing / Measurement []  - Simple Wound Cleansing - one wound 0 X- 2 5 Complex Wound Cleansing - multiple wounds X- 1 5 Wound Imaging (photographs - any number of wounds) []  - 0 Wound Tracing (instead of  photographs) []  - 0 Simple Wound Measurement - one wound X- 2 5 Complex Wound Measurement -  multiple wounds INTERVENTIONS - Wound Dressings []  - Small Wound Dressing one or multiple wounds 0 X- 2 15 Medium Wound Dressing one or multiple wounds []  - 0 Large Wound Dressing one or multiple wounds X- 1 5 Application of Medications - topical []  - 0 Application of Medications - injection INTERVENTIONS - Miscellaneous []  - External ear exam 0 []  - 0 Specimen Collection (cultures, biopsies, blood, body fluids, etc.) []  - 0 Specimen(s) / Culture(s) sent or taken to Lab for analysis []  - 0 Patient Transfer (multiple staff / Civil Service fast streamer / Similar devices) []  - 0 Simple Staple / Suture removal (25 or less) []  - 0 Complex Staple / Suture removal (26 or more) []  - 0 Hypo / Hyperglycemic Management (close monitor of Blood Glucose) []  - 0 Ankle / Brachial Index (ABI) - do not check if billed separately X- 1 5 Vital Signs [name], [name] ([id number]) Has the patient been seen at the hospital within the last three years: Yes Total Score: 130 Level Of Care: ____ Electronic Signature(s) Signed: 03/06/2019 1:01:55 PM By: Montey Hora Entered By: Montey Hora on 03/06/2019 09:38:31 [name], [name] ([id number]) -------------------------------------------------------------------------------- Encounter Discharge Information Details Patient Name: [name]. Date of Service: 03/06/2019 9:00 AM Medical Record Number: [id number] Patient Account Number: [id number] Date of Birth/Sex: [birth year] (63 y.o. F) Treating RN: Montey Hora Primary Care Walther Sanagustin: Tandy Gaw Other Clinician: Referring Devony Mcgrady: Tandy Gaw Treating Medina Degraffenreid/Extender: Melburn Hake, HOYT Weeks in Treatment: 2 Encounter Discharge Information Items Discharge Condition: Stable Ambulatory Status: Cane Discharge Destination: Home Transportation: Private Auto Accompanied By: self Schedule Follow-up Appointment:  Yes Clinical Summary of Care: Electronic Signature(s) Signed: 03/06/2019 1:01:55 PM By: Montey Hora Entered By: Montey Hora on 03/06/2019 09:31:57 [name], [name] ([id number]) -------------------------------------------------------------------------------- Lower Extremity Assessment Details Patient Name: Elizabeth Landau C. Date of Service: 03/06/2019 9:00 AM Medical Record Number: [id number] Patient Account Number: [id number] Date of Birth/Sex: 1955-09-11 (63 y.o. F) Treating RN: Army Melia Primary Care Tannis Burstein: Tandy Gaw Other Clinician: Referring Markasia Carrol: Tandy Gaw Treating Infant Zink/Extender: STONE III, HOYT Weeks in Treatment: 2 Edema Assessment Assessed: [Left: No] [Right: No] Edema: [Left: No] [Right: No] Vascular Assessment Pulses: Dorsalis Pedis Palpable: [Left:Yes] [Right:Yes] Electronic Signature(s) Signed: 03/06/2019 9:37:06 AM By: Army Melia Entered By: Army Melia on 03/06/2019 09:09:26 [name], [name] ([id number]) -------------------------------------------------------------------------------- Multi Wound Chart Details Patient Name: Elizabeth Landau C. Date of Service: 03/06/2019 9:00 AM Medical Record Number: [id number] Patient Account Number: [id number] Date of Birth/Sex: [birth year] (63 y.o. F) Treating RN: Montey Hora Primary Care Kaziyah Parkison: Tandy Gaw Other Clinician: Referring Mikeala Girdler: Tandy Gaw Treating Briane Birden/Extender: STONE III, HOYT Weeks in Treatment: 2 Vital Signs Height(in): 62 Pulse(bpm): 94 Weight(lbs): 390 Blood Pressure(mmHg): 151/90 Body Mass Index(BMI): 71 Temperature(F): 98.6 Respiratory Rate 16 (breaths/min): Photos: [N/A:N/A] Wound Location: Left Lower Leg - Medial Right Lower Leg - Medial N/A Wounding Event: Blister Gradually Appeared N/A Primary Etiology: Lymphedema Lymphedema N/A Comorbid History: Cataracts, Lymphedema, Cataracts, Lymphedema, N/A Asthma, Sleep Apnea, Asthma, Sleep  Apnea, Tuberculosis, Hypertension, Tuberculosis, Hypertension, Gout, Osteoarthritis Gout, Osteoarthritis Date Acquired: 10/08/2018 02/02/2019 N/A Weeks of Treatment: 2 2 N/A Wound Status: Open Open N/A Measurements L x W x D 9.5x13x0.1 4x9x0.1 N/A (cm) Area (cm) : 96.997 28.274 N/A Volume (cm) : 9.7 2.827 N/A % Reduction in Area: -226.70% 25.00% N/A % Reduction in Volume: -226.70% 25.00% N/A Classification: Full Thickness Without Full Thickness Without N/A Exposed Support Structures Exposed Support Structures Exudate Amount: Medium Large N/A Exudate Type: Serous Serous N/A Exudate Color: Physiological scientist N/A  Wound Margin: Thickened Indistinct, nonvisible N/A Granulation Amount: Medium (34-66%) Medium (34-66%) N/A Granulation Quality: Red Pink N/A Necrotic Amount: Medium (34-66%) Medium (34-66%) N/A Exposed Structures: Fat Layer (Subcutaneous Fat Layer (Subcutaneous N/A Tissue) Exposed: Yes Tissue) Exposed: Yes Fascia: No Fascia: No Tendon: No Tendon: No [name], [name] ([id number]) Muscle: No Muscle: No Joint: No Joint: No Bone: No Bone: No Epithelialization: None Medium (34-66%) N/A Treatment Notes Electronic Signature(s) Signed: 03/06/2019 1:01:55 PM By: Montey Hora Entered By: Montey Hora on 03/06/2019 [place], [name] ([id number]) -------------------------------------------------------------------------------- Trail Side Details Patient Name: [name]. Date of Service: 03/06/2019 9:00 AM Medical Record Number: [id number] Patient Account Number: [id number] Date of Birth/Sex: 1956/02/13 (63 y.o. F) Treating RN: Montey Hora Primary Care Vineta Carone: Tandy Gaw Other Clinician: Referring Dewell Monnier: Tandy Gaw Treating Zanai Mallari/Extender: STONE III, HOYT Weeks in Treatment: 2 Active Inactive Abuse / Safety / Falls / Self Care Management Nursing Diagnoses: Potential for falls Goals: Patient will remain injury free related to  falls Date Initiated: 02/20/2019 Target Resolution Date: 05/16/2019 Goal Status: Active Interventions: Assess fall risk on admission and as needed Notes: Orientation to the Wound Care Program Nursing Diagnoses: Knowledge deficit related to the wound healing center program Goals: Patient/caregiver will verbalize understanding of the [place] Program Date Initiated: 02/20/2019 Target Resolution Date: 05/16/2019 Goal Status: Active Interventions: Provide education on orientation to the wound center Notes: Venous Leg Ulcer Nursing Diagnoses: Actual venous Insuffiency (use after diagnosis is confirmed) Goals: Patient will maintain optimal edema control Date Initiated: 02/20/2019 Target Resolution Date: 05/16/2019 Goal Status: Active Interventions: Assess peripheral edema status every visit. [name], [name] ([id number]) Compression as ordered Notes: Wound/Skin Impairment Nursing Diagnoses: Impaired tissue integrity Goals: Ulcer/skin breakdown will heal within 14 weeks Date Initiated: 02/20/2019 Target Resolution Date: 05/16/2019 Goal Status: Active Interventions: Assess patient/caregiver ability to obtain necessary supplies Assess patient/caregiver ability to perform ulcer/skin care regimen upon admission and as needed Assess ulceration(s) every visit Notes: Electronic Signature(s) Signed: 03/06/2019 1:01:55 PM By: Montey Hora Entered By: Montey Hora on 03/06/2019 09:14:40 [name], [name] ([id number]) -------------------------------------------------------------------------------- Pain Assessment Details Patient Name: [name]. Date of Service: 03/06/2019 9:00 AM Medical Record Number: [id number] Patient Account Number: [id number] Date of Birth/Sex: 26-Jul-1955 (63 y.o. F) Treating RN: Army Melia Primary Care Maxima Skelton: Tandy Gaw Other Clinician: Referring Michalle Rademaker: Tandy Gaw Treating Dolly Harbach/Extender: STONE III, HOYT Weeks in Treatment:  2 Active Problems Location of Pain Severity and Description of Pain Patient Has Paino No Site Locations Pain Management and Medication Current Pain Management: Electronic Signature(s) Signed: 03/06/2019 9:37:06 AM By: Army Melia Entered By: Army Melia on 03/06/2019 09:00:14 [name] ([id number]) -------------------------------------------------------------------------------- Patient/Caregiver Education Details Patient Name: [name]. Date of Service: 03/06/2019 9:00 AM Medical Record Number: [id number] Patient Account Number: [id number] Date of Birth/Gender: Dec 10, 1955 (64 y.o. F) Treating RN: Montey Hora Primary Care Physician: Tandy Gaw Other Clinician: Referring Physician: Tandy Gaw Treating Physician/Extender: Sharalyn Ink in Treatment: 2 Education Assessment Education Provided To: Patient Education Topics Provided Venous: Handouts: Other: need for increased compression Electronic Signature(s) Signed: 03/06/2019 1:01:55 PM By: Montey Hora Entered By: Montey Hora on 03/06/2019 09:39:02 [name], [name] ([id number]) -------------------------------------------------------------------------------- Wound Assessment Details Patient Name: Elizabeth Landau C. Date of Service: 03/06/2019 9:00 AM Medical Record Number: [id number] Patient Account Number: [id number] Date of Birth/Sex: 09-Dec-1955 (63 y.o. F) Treating RN: Army Melia Primary Care Cataleah Stites: Tandy Gaw Other Clinician: Referring [place] Dube: Tandy Gaw Treating Reyden Smith/Extender: STONE III, HOYT Weeks in Treatment: 2 Wound Status  Wound Number: 3 Primary Lymphedema Etiology: Wound Location: Left Lower Leg - Medial Wound Open Wounding Event: Blister Status: Date Acquired: 10/08/2018 Comorbid Cataracts, Lymphedema, Asthma, Sleep Weeks Of Treatment: 2 History: Apnea, Tuberculosis, Hypertension, Gout, Clustered Wound: No Osteoarthritis Photos Wound Measurements Length:  (cm) 9.5 Width: (cm) 13 Depth: (cm) 0.1 Area: (cm) 96.997 Volume: (cm) 9.7 % Reduction in Area: -226.7% % Reduction in Volume: -226.7% Epithelialization: None Tunneling: No Undermining: No Wound Description Full Thickness Without Exposed Support Foul Odo Classification: Structures Slough/F Wound Margin: Thickened Exudate Medium Amount: Exudate Type: Serous Exudate Color: amber r After Cleansing: No ibrino Yes Wound Bed Granulation Amount: Medium (34-66%) Exposed Structure Granulation Quality: Red Fascia Exposed: No Necrotic Amount: Medium (34-66%) Fat Layer (Subcutaneous Tissue) Exposed: Yes Necrotic Quality: Adherent Slough Tendon Exposed: No Muscle Exposed: No Joint Exposed: No Bone Exposed: No [place], Zahirah C. ([id number]) Treatment Notes Wound #3 (Left, Medial Lower Leg) Notes Nystatin powder, Silver alginate, ABD, K/C Electronic Signature(s) Signed: 03/06/2019 9:37:06 AM By: Army Melia Entered By: Army Melia on 03/06/2019 09:08:36 [name], [name] ([id number]) -------------------------------------------------------------------------------- Wound Assessment Details Patient Name: Elizabeth Landau C. Date of Service: 03/06/2019 9:00 AM Medical Record Number: [id number] Patient Account Number: [id number] Date of Birth/Sex: [birth year] (63 y.o. F) Treating RN: Army Melia Primary Care Kamaryn Grimley: Tandy Gaw Other Clinician: Referring Shreeya Recendiz: Tandy Gaw Treating Elisea Khader/Extender: STONE III, HOYT Weeks in Treatment: 2 Wound Status Wound Number: 4 Primary Lymphedema Etiology: Wound Location: Right Lower Leg - Medial Wound Open Wounding Event: Gradually Appeared Status: Date Acquired: 02/02/2019 Comorbid Cataracts, Lymphedema, Asthma, Sleep Weeks Of Treatment: 2 History: Apnea, Tuberculosis, Hypertension, Gout, Clustered Wound: No Osteoarthritis Photos Wound Measurements Length: (cm) 4 Width: (cm) 9 Depth: (cm) 0.1 Area: (cm) 28.274 Volume:  (cm) 2.827 % Reduction in Area: 25% % Reduction in Volume: 25% Epithelialization: Medium (34-66%) Tunneling: No Undermining: No Wound Description Full Thickness Without Exposed Support Foul Odo Classification: Structures Slough/F Wound Margin: Indistinct, nonvisible Exudate Large Amount: Exudate Type: Serous Exudate Color: amber r After Cleansing: No ibrino Yes Wound Bed Granulation Amount: Medium (34-66%) Exposed Structure Granulation Quality: Pink Fascia Exposed: No Necrotic Amount: Medium (34-66%) Fat Layer (Subcutaneous Tissue) Exposed: Yes Necrotic Quality: Adherent Slough Tendon Exposed: No Muscle Exposed: No Joint Exposed: No Bone Exposed: No Giovannini, Courtlyn C. ([id number]) Treatment Notes Wound #4 (Right, Medial Lower Leg) Notes Nystatin powder, Silver alginate, ABD, K/C Electronic Signature(s) Signed: 03/06/2019 9:37:06 AM By: Army Melia Entered By: Army Melia on 03/06/2019 09:08:59 [place], [name] ([id number]) -------------------------------------------------------------------------------- Vitals Details Patient Name: [name]. Date of Service: 03/06/2019 9:00 AM Medical Record Number: [id number] Patient Account Number: [id number] Date of Birth/Sex: 27-Nov-1955 (63 y.o. F) Treating RN: Army Melia Primary Care Maryon Kemnitz: Tandy Gaw Other Clinician: Referring Keontay Vora: Tandy Gaw Treating Cydne Grahn/Extender: STONE III, HOYT Weeks in Treatment: 2 Vital Signs Time Taken: 09:00 Temperature (F): 98.6 Height (in): 62 Pulse (bpm): 94 Weight (lbs): 390 Respiratory Rate (breaths/min): 16 Body Mass Index (BMI): 71.3 Blood Pressure (mmHg): 151/90 Reference Range: 80 - 120 mg / dl Electronic Signature(s) Signed: 03/06/2019 9:37:06 AM By: Army Melia Entered By: Army Melia on 03/06/2019 09:01:03

## 2019-03-13 ENCOUNTER — Encounter: Payer: Medicaid Other | Attending: Physician Assistant | Admitting: Physician Assistant

## 2019-03-13 ENCOUNTER — Other Ambulatory Visit: Payer: Self-pay

## 2019-03-13 DIAGNOSIS — Z820 Family history of epilepsy and other diseases of the nervous system: Secondary | ICD-10-CM | POA: Insufficient documentation

## 2019-03-13 DIAGNOSIS — I10 Essential (primary) hypertension: Secondary | ICD-10-CM | POA: Diagnosis not present

## 2019-03-13 DIAGNOSIS — I890 Lymphedema, not elsewhere classified: Secondary | ICD-10-CM | POA: Insufficient documentation

## 2019-03-13 DIAGNOSIS — J45909 Unspecified asthma, uncomplicated: Secondary | ICD-10-CM | POA: Insufficient documentation

## 2019-03-13 DIAGNOSIS — Z833 Family history of diabetes mellitus: Secondary | ICD-10-CM | POA: Diagnosis not present

## 2019-03-13 DIAGNOSIS — L97812 Non-pressure chronic ulcer of other part of right lower leg with fat layer exposed: Secondary | ICD-10-CM | POA: Diagnosis present

## 2019-03-13 DIAGNOSIS — Z8249 Family history of ischemic heart disease and other diseases of the circulatory system: Secondary | ICD-10-CM | POA: Diagnosis not present

## 2019-03-13 DIAGNOSIS — Z809 Family history of malignant neoplasm, unspecified: Secondary | ICD-10-CM | POA: Insufficient documentation

## 2019-03-13 DIAGNOSIS — M109 Gout, unspecified: Secondary | ICD-10-CM | POA: Insufficient documentation

## 2019-03-13 DIAGNOSIS — L03115 Cellulitis of right lower limb: Secondary | ICD-10-CM | POA: Diagnosis not present

## 2019-03-13 DIAGNOSIS — Z823 Family history of stroke: Secondary | ICD-10-CM | POA: Insufficient documentation

## 2019-03-13 DIAGNOSIS — Z87891 Personal history of nicotine dependence: Secondary | ICD-10-CM | POA: Insufficient documentation

## 2019-03-13 DIAGNOSIS — Z886 Allergy status to analgesic agent status: Secondary | ICD-10-CM | POA: Diagnosis not present

## 2019-03-13 DIAGNOSIS — E669 Obesity, unspecified: Secondary | ICD-10-CM | POA: Diagnosis not present

## 2019-03-13 DIAGNOSIS — M1990 Unspecified osteoarthritis, unspecified site: Secondary | ICD-10-CM | POA: Diagnosis not present

## 2019-03-13 DIAGNOSIS — L97822 Non-pressure chronic ulcer of other part of left lower leg with fat layer exposed: Secondary | ICD-10-CM | POA: Insufficient documentation

## 2019-03-13 DIAGNOSIS — Z881 Allergy status to other antibiotic agents status: Secondary | ICD-10-CM | POA: Diagnosis not present

## 2019-03-13 NOTE — Progress Notes (Addendum)
THIENAN, WESTOVER ([id number]) Visit Report for 03/13/2019 Chief Complaint Document Details Patient Name: [name], [name]. Date of Service: 03/13/2019 9:15 AM Medical Record Number: [id number] Patient Account Number: [id number] Date of Birth/Sex: 02/12/1956 (63 y.o. F) Treating RN: Montey Hora Primary Care Provider: Tandy Gaw Other Clinician: Referring Provider: Tandy Gaw Treating Provider/Extender: Melburn Hake, HOYT Weeks in Treatment: 3 Information Obtained from: Patient Chief Complaint Left LE Ulcer Electronic Signature(s) Signed: 03/13/2019 9:42:40 AM By: Worthy Keeler PA-C Entered By: Worthy Keeler on 03/13/2019 09:42:40 Stepp, [name] ([id number]) -------------------------------------------------------------------------------- HPI Details Patient Name: [name]. Date of Service: 03/13/2019 9:15 AM Medical Record Number: [id number] Patient Account Number: [id number] Date of Birth/Sex: 03/31/1956 (63 y.o. F) Treating RN: Montey Hora Primary Care Provider: Tandy Gaw Other Clinician: Referring Provider: Tandy Gaw Treating Provider/Extender: STONE III, HOYT Weeks in Treatment: 3 History of Present Illness HPI Description: The patient is a 63 year old female with history of hypertension and a long-standing history of bilateral lower extremity lymphedema (first presented on 4/2) . She has had open ulcers in the past which have always responded to compression therapy. She had briefly been to a lymphedema clinic in the past which helped her at the time. this time around she stopped treatment of her lymphedema pumps approximately 2 weeks ago because of some pain in the knees and then noticed the right leg getting worse. She was seen by her PCP who put her on clindamycin 4 times a day 2 days ago. The patient has seen [facility] and Dr. Delana Meyer had seen her last year where a vascular study including venous and arterial duplex studies were within normal limits. he had  recommended compression stockings and lymphedema pumps and the patient has been using this in about 2 weeks ago. She is known to be diabetic but in the past few time she's gone to her primary care doctor her hemoglobin A1c has been normal. 02/11/2015 - after her last visit she took my advice and went to the ER regarding the progressive cellulitis of her right lower extremity and she was admitted between July 17 and 22nd. She received IV antibiotics and then was sent home on a course of steroid-induced and oral antibiotics. She has improved much since then. 02/17/2015 -- she has been doing fine and the weeping of her legs has remarkably gone down. She has no fresh issues. READMISSION 01/15/18 This patient was given this clinic before most recently in 2016 seen by Dr. Con Memos. She has massive bilateral lymphedema and over the last 2 months this had weeping edema out of the left leg. She has compression pumps but her compliance with these has been minimal. She has [facility] they've been using TCA/ABDs/kerlix under an Ace wrap.she has had recent problems with cellulitis. She was apparently seen in the ER and 12/23/17 and given clindamycin. She was then followed by her primary doctor and given doxycycline and Keflex. The pain seems to have settled down. In April 2018 the patient had arterial studies done at [facility]. This showed triphasic waveforms throughout the right leg and mostly triphasic waveforms on the left except for monophasic at the posterior tibial artery distally. She was not felt to have evidence of right lower extremity arterial stenosis or significant problems on the left side. 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 She also had a right lower extremity venous Doppler in January 2018 this was limited by the patient's body habitus and lymphedema. Most of the  proximal veins were not visualized The patient presents with an area of  denuded skin on the anterior medial part of the left calf. There is weeping edema fluid here. 01/22/18; the patient has somewhat better edema control using her compression pumps twice a day and as a result she has much better epithelialization on the left anterior calf area. Only a small open area remains. 01/29/18; the patient has been compliant with her compression pumps. Both the areas on her calf that healed. The remaining area on the left anterior leg is fully epithelialized Readmission: 02/20/2019 upon evaluation today patient presents for reevaluation due to issues that she is having with the bilateral lower extremities. She actually has wounds open on both legs. On the right she has an area in the crease of her leg on the right around the knee region which is actually draining quite a bit and actually has some fungal type appearance to it. She has been on nystatin powder that seems to have helped to some degree. In regard to the left lower extremity this is actually in the [place]. ([id number]) lower portion of her leg closer to the ankle and again is continuing to drain as well unfortunately. There does not appear to be any signs of active infection at this time which is good news. No fevers, chills, nausea, vomiting, or diarrhea. She tells me that since she was seen last year she is actually been doing quite well for the most part with regard to her lower extremities. Unfortunately she now is experiencing a little bit more drainage at this time. She is concerned about getting this under control so that it does not get significantly worse. 02/27/2019 on evaluation today patient appears to be doing somewhat better in regard to her bilateral lower extremity wounds. She has been tolerating the dressing changes without complication. Fortunately there is no signs of active infection at this point. No fevers, chills, nausea, vomiting, or diarrhea. She did get her dressing supplies which is  excellent news she was extremely excited to get these. She also got paperwork from [employer] for their financial assistance program where they may be able to help her out in the future if needed with supplies at discounted prices. 03/06/2019 on evaluation today patient appears to be doing a little worse with regard to both areas of weeping on her bilateral lower extremities. This is around the right medial knee and just above the left ankle. With that being said she is unfortunately not doing as well as I would like to see. I feel like she may need to potentially go see someone at the lymphedema clinic as the wraps that she needs or even beyond what we can do here at the wound care center. She really does not have wounds she just has open areas of weeping that are causing some difficulty for her. Subsequently because of this and the moisture I am concerned about the potential for infection I am going to likely give her a prophylactic antibiotic today, Keflex, just to be on the safe side. Nonetheless again there is no obvious signs of active infection at this time. 03/13/2019 on evaluation today patient appears to be doing well with regard to her bilateral lower extremities where she has been weeping compared to even last week's evaluation. I see some areas of new skin growth which is excellent and overall I am very pleased with how things seem to be progressing. No fevers, chills, nausea, vomiting, or diarrhea. Electronic Signature(s) Signed:  03/13/2019 9:59:35 AM By: Worthy Keeler PA-C Entered By: Worthy Keeler on 03/13/2019 09:59:35 Paolo, [name] ([id number]) -------------------------------------------------------------------------------- Physical Exam Details Patient Name: YASMEN, GALLICK C. Date of Service: 03/13/2019 9:15 AM Medical Record Number: [id number] Patient Account Number: [id number] Date of Birth/Sex: 1955-09-11 (63 y.o. F) Treating RN: Montey Hora Primary Care Provider: Tandy Gaw Other Clinician: Referring Provider: Tandy Gaw Treating Provider/Extender: STONE III, HOYT Weeks in Treatment: 3 Constitutional Well-nourished and well-hydrated in no acute distress. Respiratory normal breathing without difficulty. clear to auscultation bilaterally. Cardiovascular regular rate and rhythm with normal S1, S2. Psychiatric this patient is able to make decisions and demonstrates good insight into disease process. Alert and Oriented x 3. pleasant and cooperative. Notes Upon inspection today patient's wound bed actually showed signs of improvement with some new skin growth around both areas of her wounds right and left lower extremities. Overall I am very pleased there is no signs of active infection and I feel like she is doing quite well today. She still has not heard from the lymphedema clinic although she believes I did try to call her earlier in the week she was not able to get to the phone and time and then there was no specific number for her to call back she does not have an answering machine. Nonetheless waiting to give her information for get in touch with him to get this scheduled. Electronic Signature(s) Signed: 03/13/2019 10:01:14 AM By: Worthy Keeler PA-C Entered By: Worthy Keeler on 03/13/2019 10:01:13 [name] ([id number]) -------------------------------------------------------------------------------- Physician Orders Details Patient Name: [name] Date of Service: 03/13/2019 9:15 AM Medical Record Number: [id number] Patient Account Number: [id number] Date of Birth/Sex: Nov 02, 1955 (63 y.o. F) Treating RN: Montey Hora Primary Care Provider: Tandy Gaw Other Clinician: Referring Provider: Tandy Gaw Treating Provider/Extender: STONE III, HOYT Weeks in Treatment: 3 Verbal / Phone Orders: No Diagnosis Coding ICD-10 Coding Code Description I89.0 Lymphedema, not elsewhere classified L97.822 Non-pressure chronic ulcer of  other part of left lower leg with fat layer exposed L97.812 Non-pressure chronic ulcer of other part of right lower leg with fat layer exposed Wound Cleansing Wound #3 Left,Medial Lower Leg o Dial antibacterial soap, wash wounds, rinse and pat dry prior to dressing wounds o May Shower, gently pat wound dry prior to applying new dressing. Wound #4 Right,Medial Lower Leg o Dial antibacterial soap, wash wounds, rinse and pat dry prior to dressing wounds o May Shower, gently pat wound dry prior to applying new dressing. Skin Barriers/Peri-Wound Care Wound #3 Left,Medial Lower Leg o Antifungal powder-Nystatin Wound #4 Right,Medial Lower Leg o Antifungal powder-Nystatin Primary Wound Dressing Wound #3 Left,Medial Lower Leg o Silver Alginate Wound #4 Right,Medial Lower Leg o Silver Alginate Secondary Dressing Wound #3 Left,Medial Lower Leg o ABD pad Wound #4 Right,Medial Lower Leg o ABD pad Dressing Change Frequency Wound #3 Left,Medial Lower Leg o Change dressing every other day. Wound #4 Right,Medial Lower Leg o Change dressing every other day. ALIZON, KOELLNER ([id number]) Follow-up Appointments o Return Appointment in 1 week. Edema Control Wound #3 Left,Medial Lower Leg o Kerlix and Coban - Left Lower Extremity Electronic Signature(s) Signed: 03/13/2019 4:03:55 PM By: Montey Hora Signed: 03/13/2019 6:57:28 PM By: Worthy Keeler PA-C Entered By: Montey Hora on 03/13/2019 09:52:33 Mannes, [name] ([id number]) -------------------------------------------------------------------------------- Problem List Details Patient Name: KAREEMAH, GORGAS C. Date of Service: 03/13/2019 9:15 AM Medical Record Number: [id number] Patient Account Number: [id number] Date of Birth/Sex: Oct 11, 1955 (63 y.o. F) Treating RN: Marjory Lies,  Di Kindle Primary Care Provider: Tandy Gaw Other Clinician: Referring Provider: Tandy Gaw Treating Provider/Extender: Melburn Hake,  HOYT Weeks in Treatment: 3 Active Problems ICD-10 Evaluated Encounter Code Description Active Date Today Diagnosis I89.0 Lymphedema, not elsewhere classified 02/20/2019 No Yes L97.822 Non-pressure chronic ulcer of other part of left lower leg with 02/20/2019 No Yes fat layer exposed L97.812 Non-pressure chronic ulcer of other part of right lower leg 02/20/2019 No Yes with fat layer exposed Inactive Problems Resolved Problems Electronic Signature(s) Signed: 03/13/2019 9:42:30 AM By: Worthy Keeler PA-C Entered By: Worthy Keeler on 03/13/2019 09:42:30 Files, [name] ([id number]) -------------------------------------------------------------------------------- Progress Note Details Patient Name: [name]. Date of Service: 03/13/2019 9:15 AM Medical Record Number: [id number] Patient Account Number: [id number] Date of Birth/Sex: 1956-04-17 (63 y.o. F) Treating RN: Montey Hora Primary Care Provider: Tandy Gaw Other Clinician: Referring Provider: Tandy Gaw Treating Provider/Extender: STONE III, HOYT Weeks in Treatment: 3 Subjective Chief Complaint Information obtained from Patient Left LE Ulcer History of Present Illness (HPI) The patient is a 63 year old female with history of hypertension and a long-standing history of bilateral lower extremity lymphedema (first presented on 4/2) . She has had open ulcers in the past which have always responded to compression therapy. She had briefly been to a lymphedema clinic in the past which helped her at the time. this time around she stopped treatment of her lymphedema pumps approximately 2 weeks ago because of some pain in the knees and then noticed the right leg getting worse. She was seen by her PCP who put her on clindamycin 4 times a day 2 days ago. The patient has seen [facility] and Dr. Delana Meyer had seen her last year where a vascular study including venous and arterial duplex studies were within normal limits. he had  recommended compression stockings and lymphedema pumps and the patient has been using this in about 2 weeks ago. She is known to be diabetic but in the past few time she's gone to her primary care doctor her hemoglobin A1c has been normal. 02/11/2015 - after her last visit she took my advice and went to the ER regarding the progressive cellulitis of her right lower extremity and she was admitted between July 17 and 22nd. She received IV antibiotics and then was sent home on a course of steroid-induced and oral antibiotics. She has improved much since then. 02/17/2015 -- she has been doing fine and the weeping of her legs has remarkably gone down. She has no fresh issues. READMISSION 01/15/18 This patient was given this clinic before most recently in 2016 seen by Dr. Con Memos. She has massive bilateral lymphedema and over the last 2 months this had weeping edema out of the left leg. She has compression pumps but her compliance with these has been minimal. She has [facility] they've been using TCA/ABDs/kerlix under an Ace wrap.she has had recent problems with cellulitis. She was apparently seen in the ER and 12/23/17 and given clindamycin. She was then followed by her primary doctor and given doxycycline and Keflex. The pain seems to have settled down. In April 2018 the patient had arterial studies done at [facility]. This showed triphasic waveforms throughout the right leg and mostly triphasic waveforms on the left except for monophasic at the posterior tibial artery distally. She was not felt to have evidence of right lower extremity arterial stenosis or significant problems on the left side. 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 She also had a right  lower extremity venous Doppler in January 2018 this was limited by the patient's body habitus and lymphedema. Most of the proximal veins were not visualized The patient presents with an area of  denuded skin on the anterior medial part of the left calf. There is weeping edema fluid here. 01/22/18; the patient has somewhat better edema control using her compression pumps twice a day and as a result she has much better epithelialization on the left anterior calf area. Only a small open area remains. 01/29/18; the patient has been compliant with her compression pumps. Both the areas on her calf that healed. The remaining area on the left anterior leg is fully epithelialized QUEENA, MATZKE. ([id number]) Readmission: 02/20/2019 upon evaluation today patient presents for reevaluation due to issues that she is having with the bilateral lower extremities. She actually has wounds open on both legs. On the right she has an area in the crease of her leg on the right around the knee region which is actually draining quite a bit and actually has some fungal type appearance to it. She has been on nystatin powder that seems to have helped to some degree. In regard to the left lower extremity this is actually in the lower portion of her leg closer to the ankle and again is continuing to drain as well unfortunately. There does not appear to be any signs of active infection at this time which is good news. No fevers, chills, nausea, vomiting, or diarrhea. She tells me that since she was seen last year she is actually been doing quite well for the most part with regard to her lower extremities. Unfortunately she now is experiencing a little bit more drainage at this time. She is concerned about getting this under control so that it does not get significantly worse. 02/27/2019 on evaluation today patient appears to be doing somewhat better in regard to her bilateral lower extremity wounds. She has been tolerating the dressing changes without complication. Fortunately there is no signs of active infection at this point. No fevers, chills, nausea, vomiting, or diarrhea. She did get her dressing supplies which is  excellent news she was extremely excited to get these. She also got paperwork from [employer] for their financial assistance program where they may be able to help her out in the future if needed with supplies at discounted prices. 03/06/2019 on evaluation today patient appears to be doing a little worse with regard to both areas of weeping on her bilateral lower extremities. This is around the right medial knee and just above the left ankle. With that being said she is unfortunately not doing as well as I would like to see. I feel like she may need to potentially go see someone at the lymphedema clinic as the wraps that she needs or even beyond what we can do here at the wound care center. She really does not have wounds she just has open areas of weeping that are causing some difficulty for her. Subsequently because of this and the moisture I am concerned about the potential for infection I am going to likely give her a prophylactic antibiotic today, Keflex, just to be on the safe side. Nonetheless again there is no obvious signs of active infection at this time. 03/13/2019 on evaluation today patient appears to be doing well with regard to her bilateral lower extremities where she has been weeping compared to even last week's evaluation. I see some areas of new skin growth which is excellent and overall I  am very pleased with how things seem to be progressing. No fevers, chills, nausea, vomiting, or diarrhea. Patient History Information obtained from Patient. Family History Cancer - Father, Diabetes - Father, Heart Disease - Mother, Hypertension - Mother,Father, Seizures - Mother, Stroke - Mother, Thyroid Problems - Mother,Siblings, No family history of Hereditary Spherocytosis, Kidney Disease, Lung Disease, Tuberculosis. Social History Former smoker - uses snuff, Marital Status - Single, Alcohol Use - Rarely, Drug Use - No History, Caffeine Use - Daily. 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 ([id number]) Endocrine Denies history of Type I Diabetes, Type II Diabetes Genitourinary Denies history of End Stage Renal Disease Immunological Denies history of Lupus Erythematosus, Raynaud s, Scleroderma Integumentary (Skin) Denies history of History of Burn, History of pressure wounds Musculoskeletal Patient has history of Gout, Osteoarthritis Denies history of Rheumatoid Arthritis, Osteomyelitis Neurologic Denies history of Dementia, Neuropathy, Quadriplegia, Paraplegia, Seizure Disorder Oncologic Denies history of Received Chemotherapy, Received Radiation Psychiatric Denies history of Anorexia/bulimia, Confinement Anxiety Review of Systems (ROS) Constitutional Symptoms (General Health) Denies complaints or symptoms of Fatigue, Fever, Chills, Marked Weight Change. Respiratory Denies complaints or symptoms of Chronic or frequent coughs, Shortness of Breath. Cardiovascular Complains or has symptoms of LE edema. Denies complaints or symptoms of Chest pain. Psychiatric Denies complaints or  symptoms of Anxiety, Claustrophobia. Objective Constitutional Well-nourished and well-hydrated in no acute distress. Vitals Time Taken: 9:35 AM, Height: 62 in, Weight: 390 lbs, BMI: 71.3, Temperature: 99.0 F, Pulse: 68 bpm, Respiratory Rate: 16 breaths/min, Blood Pressure: 142/68 mmHg. Respiratory normal breathing without difficulty. clear to auscultation bilaterally. Cardiovascular regular rate and rhythm with normal S1, S2. Psychiatric this patient is able to make decisions and demonstrates good insight into disease process. Alert and Oriented x 3. pleasant and cooperative. General Notes: Upon inspection today patient's wound bed actually showed signs of improvement with some new skin growth around both areas of her wounds right and left lower extremities. Overall I am very pleased there is no signs of active Addair, Sharin C. ([id number]) infection and I feel like she is doing quite well today. She still has not heard from the lymphedema clinic although she believes I did try to call her earlier in the week she was not able to get to the phone and time and then there was no specific number for her to call back she does not have an answering machine. Nonetheless waiting to give her information for get in touch with him to get this scheduled. Integumentary (Hair, Skin) Wound #3 status is Open. Original cause of wound was Blister. The wound is located on the Left,Medial Lower Leg. The wound measures 11cm length x 14cm width x 0.1cm depth; 120.951cm^2 area and 12.095cm^3 volume. There is Fat Layer (Subcutaneous Tissue) Exposed exposed. There is no tunneling or undermining noted. There is a medium amount of serous drainage noted. The  wound margin is thickened. There is medium (34-66%) red granulation within the wound bed. There is a medium (34-66%) amount of necrotic tissue within the wound bed including Adherent Slough. Wound #4 status is Open. Original cause of wound was Gradually  Appeared. The wound is located on the Right,Medial Lower Leg. The wound measures 4cm length x 5cm width x 0.1cm depth; 15.708cm^2 area and 1.571cm^3 volume. There is Fat Layer (Subcutaneous Tissue) Exposed exposed. There is no tunneling or undermining noted. There is a large amount of serous drainage noted. The wound margin is indistinct and nonvisible. There is medium (34-66%) pink granulation within the wound bed. There is a medium (34-66%) amount of necrotic tissue within the wound bed including Adherent Slough. Assessment Active Problems ICD-10 Lymphedema, not elsewhere classified Non-pressure chronic ulcer of other part of left lower leg with fat layer exposed Non-pressure chronic ulcer of other part of right lower leg with fat layer exposed Plan Wound Cleansing: Wound #3 Left,Medial Lower Leg: Dial antibacterial soap, wash wounds, rinse and pat dry prior to dressing wounds May Shower, gently pat wound dry prior to applying new dressing. Wound #4 Right,Medial Lower Leg: Dial antibacterial soap, wash wounds, rinse and pat dry prior to dressing wounds May Shower, gently pat wound dry prior to applying new dressing. Skin Barriers/Peri-Wound Care: Wound #3 Left,Medial Lower Leg: Antifungal powder-Nystatin Wound #4 Right,Medial Lower Leg: Antifungal powder-Nystatin Primary Wound Dressing: Wound #3 Left,Medial Lower Leg: Silver Alginate Wound #4 Right,Medial Lower Leg: Silver Alginate Secondary Dressing: Wound #3 Left,Medial Lower Leg: ABD pad Wound #4 Right,Medial Lower Leg: Sahli, Shaily C. ([id number]) ABD pad Dressing Change Frequency: Wound #3 Left,Medial Lower Leg: Change dressing every other day. Wound #4 Right,Medial Lower Leg: Change dressing every other day. Follow-up Appointments: Return Appointment in 1 week. Edema Control: Wound #3 Left,Medial Lower Leg: Kerlix and Coban - Left Lower Extremity 1. I would recommend that we continue currently with the current  wound care measures including the antifungal spray which the patient actually purchased over-the-counter along with the silver alginate dressing here in the office. We are using nystatin powder here in the office for her dressing today. 2. I am also going to suggest that we go ahead and continue with the Kerlix and Coban wrap to the left lower extremity to help with some compression at this site she will use the Ace wrap on the right around her knee. 3. I still think getting into the lymphedema clinic would be a good idea she should hopefully be get in touch with him if not today at the beginning of next week to see about getting this scheduled. We have given her the information, phone number, to get in touch with them. We will see patient back for reevaluation in 1 week here in the clinic. If anything worsens or changes patient will contact our office for additional recommendations. Electronic Signature(s) Signed: 03/13/2019 10:03:44 AM By: Worthy Keeler PA-C Entered By: Worthy Keeler on 03/13/2019 10:03:44 [name] ([id number]) -------------------------------------------------------------------------------- ROS/PFSH Details Patient Name: [name] Date of Service: 03/13/2019 9:15 AM Medical Record Number: [id number] Patient Account Number: [id number] Date of Birth/Sex: Jul 06, 1956 (63 y.o. 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 Negative for: Aspiration;  Chronic Obstructive Pulmonary Disease (COPD); Pneumothorax Cardiovascular Complaints and Symptoms: Positive for: LE edema Negative for: Chest pain Medical History: Positive for: Hypertension Negative for: Angina; Arrhythmia; Congestive Heart Failure; Coronary Artery Disease; Deep Vein Thrombosis; Hypotension; Myocardial Infarction; Peripheral Arterial Disease; Peripheral Venous Disease; Phlebitis; Vasculitis Psychiatric Complaints and Symptoms: Negative for: Anxiety; Claustrophobia Medical History: Negative for: Anorexia/bulimia; Confinement Anxiety Eyes Medical History: Positive for: Cataracts Negative for: Glaucoma; Optic Neuritis Ear/Nose/Mouth/Throat Medical History: Negative for: Chronic sinus problems/congestion; Middle ear problems Hematologic/Lymphatic Medical History: Positive for: Lymphedema MARIADELROSARI, JENSEN. ([id number]) Negative for: Anemia; Hemophilia; Human Immunodeficiency Virus; Sickle Cell Disease Gastrointestinal Medical History: Negative for: Cirrhosis ; Colitis; Crohnos; Hepatitis A; Hepatitis B; Hepatitis C Endocrine Medical History: Negative for: Type I Diabetes; Type II Diabetes Genitourinary Medical History: Negative for: End Stage Renal Disease Immunological Medical History: Negative for: Lupus Erythematosus; Raynaudos; Scleroderma Integumentary (Skin) Medical History: Negative for: History of Burn; History of pressure wounds Musculoskeletal Medical History: Positive for: Gout; Osteoarthritis Negative for: Rheumatoid Arthritis; Osteomyelitis Neurologic Medical History: Negative for: Dementia; Neuropathy; Quadriplegia; Paraplegia; Seizure Disorder Oncologic Medical History: Negative for: Received Chemotherapy; Received Radiation HBO Extended History Items Eyes: Cataracts Immunizations Pneumococcal Vaccine: Received Pneumococcal Vaccination: Yes Implantable Devices No devices added Family and Social History Cancer: Yes - Father;  Diabetes: Yes - Father; Heart Disease: Yes - Mother; Hereditary Spherocytosis: No; Hypertension: Yes - Mother,Father; Kidney Disease: No; Lung Disease: No; Seizures: Yes - Mother; Stroke: Yes - Mother; Thyroid Problems: Yes - Mother,Siblings; Tuberculosis: No; Former smoker - uses snuff; Marital Status - Single; Alcohol Use: Rarely; Drug Use: AMMA, GIRDLER C. ([id number]) No History; Caffeine Use: Daily; Financial Concerns: No; Food, Clothing or Shelter Needs: No; Support System Lacking: No; Transportation Concerns: No Physician Affirmation I have reviewed and agree with the above information. Electronic Signature(s) Signed: 03/13/2019 4:03:55 PM By: Montey Hora Signed: 03/13/2019 6:57:28 PM By: Worthy Keeler PA-C Entered By: Worthy Keeler on 03/13/2019 10:00:04 [name] ([id number]) -------------------------------------------------------------------------------- SuperBill Details Patient Name: [name]. Date of Service: 03/13/2019 Medical Record Number: [id number] Patient Account Number: [id number] Date of Birth/Sex: 17-Mar-1956 (63 y.o. F) Treating RN: Montey Hora Primary Care Provider: Tandy Gaw Other Clinician: Referring Provider: Tandy Gaw Treating Provider/Extender: STONE III, HOYT Weeks in Treatment: 3 Diagnosis Coding ICD-10 Codes Code Description I89.0 Lymphedema, not elsewhere classified L97.822 Non-pressure chronic ulcer of other part of left lower leg with fat layer exposed L97.812 Non-pressure chronic ulcer of other part of right lower leg with fat layer exposed Facility Procedures CPT4 Code: [id number] Description: 99214 - WOUND CARE VISIT-LEV 4 EST PT Modifier: Quantity: 1 Physician Procedures CPT4 Code Description: [id number] - WC PHYS LEVEL 4 - EST PT ICD-10 Diagnosis Description I89.0 Lymphedema, not elsewhere classified L97.822 Non-pressure chronic ulcer of other part of left lower leg wit L97.812 Non-pressure chronic ulcer of other   part of right lower leg wi Modifier: h fat layer expos th fat layer expo Quantity: 1 ed sed Electronic Signature(s) Signed: 03/13/2019 10:03:56 AM By: Worthy Keeler PA-C Entered By: Worthy Keeler on 03/13/2019 10:03:56

## 2019-03-13 NOTE — Progress Notes (Signed)
Elizabeth, [name] ([id number]) Visit Report for 03/13/2019 Arrival Information Details Patient Name: [name], [name]. Date of Service: 03/13/2019 9:15 AM Medical Record Number: [id number] Patient Account Number: [id number] Date of Birth/Sex: Apr 01, 1956 (63 y.o. F) Treating RN: Montey Hora Primary Care Dellamae Rosamilia: Tandy Gaw Other Clinician: Referring Irish Breisch: Tandy Gaw Treating Caila Cirelli/Extender: STONE III, HOYT Weeks in Treatment: 3 Visit Information History Since Last Visit Added or deleted any medications: No Patient Arrived: Cane Any new allergies or adverse reactions: No Arrival Time: 09:36 Had a fall or experienced change in No Accompanied By: self activities of daily living that may affect Transfer Assistance: None risk of falls: Patient Identification Verified: Yes Signs or symptoms of abuse/neglect since last visito No Secondary Verification Process Completed: Yes Hospitalized since last visit: No Implantable device outside of the clinic excluding No cellular tissue based products placed in the center since last visit: Has Dressing in Place as Prescribed: Yes Pain Present Now: No Electronic Signature(s) Signed: 03/13/2019 3:54:12 PM By: Lorine Bears RCP, RRT, CHT Entered By: Lorine Bears on 03/13/2019 09:38:07 Gaster, [name] ([id number]) -------------------------------------------------------------------------------- Clinic Level of Care Assessment Details Patient Name: [name]. Date of Service: 03/13/2019 9:15 AM Medical Record Number: [id number] Patient Account Number: [id number] Date of Birth/Sex: 1956/02/27 (63 y.o. F) Treating RN: Montey Hora Primary Care Vernal Rutan: Tandy Gaw Other Clinician: Referring Oceanna Arruda: Tandy Gaw Treating Minette Manders/Extender: STONE III, HOYT Weeks in Treatment: 3 Clinic Level of Care Assessment Items TOOL 4 Quantity Score []  - Use when only an EandM is performed on FOLLOW-UP visit  0 ASSESSMENTS - Nursing Assessment / Reassessment X - Reassessment of Co-morbidities (includes updates in patient status) 1 10 X- 1 5 Reassessment of Adherence to Treatment Plan ASSESSMENTS - Wound and Skin Assessment / Reassessment []  - Simple Wound Assessment / Reassessment - one wound 0 X- 2 5 Complex Wound Assessment / Reassessment - multiple wounds []  - 0 Dermatologic / Skin Assessment (not related to wound area) ASSESSMENTS - Focused Assessment X - Circumferential Edema Measurements - multi extremities 1 5 []  - 0 Nutritional Assessment / Counseling / Intervention X- 1 5 Lower Extremity Assessment (monofilament, tuning fork, pulses) []  - 0 Peripheral Arterial Disease Assessment (using hand held doppler) ASSESSMENTS - Ostomy and/or Continence Assessment and Care []  - Incontinence Assessment and Management 0 []  - 0 Ostomy Care Assessment and Management (repouching, etc.) PROCESS - Coordination of Care X - Simple Patient / Family Education for ongoing care 1 15 []  - 0 Complex (extensive) Patient / Family Education for ongoing care X- 1 10 Staff obtains Programmer, systems, Records, Test Results / Process Orders []  - 0 Staff telephones HHA, Nursing Homes / Clarify orders / etc []  - 0 Routine Transfer to another Facility (non-emergent condition) []  - 0 Routine Hospital Admission (non-emergent condition) []  - 0 New Admissions / Biomedical engineer / Ordering NPWT, Apligraf, etc. []  - 0 Emergency Hospital Admission (emergent condition) X- 1 10 Simple Discharge Coordination LYNDSI, ALLY. ([id number]) []  - 0 Complex (extensive) Discharge Coordination PROCESS - Special Needs []  - Pediatric / Minor Patient Management 0 []  - 0 Isolation Patient Management []  - 0 Hearing / Language / Visual special needs []  - 0 Assessment of Community assistance (transportation, D/C planning, etc.) []  - 0 Additional assistance / Altered mentation []  - 0 Support Surface(s) Assessment (bed,  cushion, seat, etc.) INTERVENTIONS - Wound Cleansing / Measurement []  - Simple Wound Cleansing - one wound 0 X- 2 5 Complex Wound Cleansing - multiple wounds X-  1 5 Wound Imaging (photographs - any number of wounds) []  - 0 Wound Tracing (instead of photographs) []  - 0 Simple Wound Measurement - one wound X- 2 5 Complex Wound Measurement - multiple wounds INTERVENTIONS - Wound Dressings []  - Small Wound Dressing one or multiple wounds 0 []  - 0 Medium Wound Dressing one or multiple wounds X- 2 20 Large Wound Dressing one or multiple wounds X- 1 5 Application of Medications - topical []  - 0 Application of Medications - injection INTERVENTIONS - Miscellaneous []  - External ear exam 0 []  - 0 Specimen Collection (cultures, biopsies, blood, body fluids, etc.) []  - 0 Specimen(s) / Culture(s) sent or taken to Lab for analysis []  - 0 Patient Transfer (multiple staff / Civil Service fast streamer / Similar devices) []  - 0 Simple Staple / Suture removal (25 or less) []  - 0 Complex Staple / Suture removal (26 or more) []  - 0 Hypo / Hyperglycemic Management (close monitor of Blood Glucose) []  - 0 Ankle / Brachial Index (ABI) - do not check if billed separately X- 1 5 Vital Signs Soberano, Mykah C. ([id number]) Has the patient been seen at the hospital within the last three years: Yes Total Score: 145 Level Of Care: New/Established - Level 4 Electronic Signature(s) Signed: 03/13/2019 4:03:55 PM By: Montey Hora Entered By: Montey Hora on 03/13/2019 Tipton, [name] ([id number]) -------------------------------------------------------------------------------- Encounter Discharge Information Details Patient Name: [name]. Date of Service: 03/13/2019 9:15 AM Medical Record Number: [id number] Patient Account Number: [id number] Date of Birth/Sex: 03-01-1956 (63 y.o. F) Treating RN: Montey Hora Primary Care Harrol Novello: Tandy Gaw Other Clinician: Referring Lorn Butcher: Tandy Gaw Treating [place] Merida/Extender: Melburn Hake, HOYT Weeks in Treatment: 3 Encounter Discharge Information Items Discharge Condition: Stable Ambulatory Status: Ambulatory Discharge Destination: Home Transportation: Private Auto Accompanied By: self Schedule Follow-up Appointment: Yes Clinical Summary of Care: Electronic Signature(s) Signed: 03/13/2019 4:03:55 PM By: Montey Hora Entered By: Montey Hora on 03/13/2019 09:54:23 [place], [name] ([id number]) -------------------------------------------------------------------------------- Lower Extremity Assessment Details Patient Name: [name]. Date of Service: 03/13/2019 9:15 AM Medical Record Number: [id number] Patient Account Number: [id number] Date of Birth/Sex: Oct 24, 1955 (63 y.o. F) Treating RN: Army Melia Primary Care Mutasim Tuckey: Tandy Gaw Other Clinician: Referring Chia Rock: Tandy Gaw Treating Safwan Tomei/Extender: STONE III, HOYT Weeks in Treatment: 3 Edema Assessment Assessed: [Left: No] [Right: No] Edema: [Left: Yes] [Right: Yes] Vascular Assessment Pulses: Dorsalis Pedis Palpable: [Left:Yes] [Right:Yes] Electronic Signature(s) Signed: 03/13/2019 11:25:26 AM By: Army Melia Entered By: Army Melia on 03/13/2019 09:44:00 Whitwell, [name] ([id number]) -------------------------------------------------------------------------------- Multi Wound Chart Details Patient Name: [name] Date of Service: 03/13/2019 9:15 AM Medical Record Number: [id number] Patient Account Number: [id number] Date of Birth/Sex: February 25, 1956 (63 y.o. F) Treating RN: Montey Hora Primary Care Xzander Gilham: Tandy Gaw Other Clinician: Referring Kalila Adkison: Tandy Gaw Treating Jon Kasparek/Extender: STONE III, HOYT Weeks in Treatment: 3 Vital Signs Height(in): 62 Pulse(bpm): 34 Weight(lbs): 390 Blood Pressure(mmHg): 142/68 Body Mass Index(BMI): 71 Temperature(F): 99.0 Respiratory Rate 16 (breaths/min): Photos:  [N/A:N/A] Wound Location: Left Lower Leg - Medial Right Lower Leg - Medial N/A Wounding Event: Blister Gradually Appeared N/A Primary Etiology: Lymphedema Lymphedema N/A Comorbid History: Cataracts, Lymphedema, Cataracts, Lymphedema, N/A Asthma, Sleep Apnea, Asthma, Sleep Apnea, Tuberculosis, Hypertension, Tuberculosis, Hypertension, Gout, Osteoarthritis Gout, Osteoarthritis Date Acquired: 10/08/2018 02/02/2019 N/A Weeks of Treatment: 3 3 N/A Wound Status: Open Open N/A Measurements L x W x D 11x14x0.1 4x5x0.1 N/A (cm) Area (cm) : 120.951 15.708 N/A Volume (cm) : 12.095 1.571 N/A % Reduction in Area: -307.40% 58.30%  N/A % Reduction in Volume: -307.40% 58.30% N/A Classification: Full Thickness Without Full Thickness Without N/A Exposed Support Structures Exposed Support Structures Exudate Amount: Medium Large N/A Exudate Type: Serous Serous N/A Exudate Color: amber amber N/A Wound Margin: Thickened Indistinct, nonvisible N/A Granulation Amount: Medium (34-66%) Medium (34-66%) N/A Granulation Quality: Red Pink N/A Necrotic Amount: Medium (34-66%) Medium (34-66%) N/A Exposed Structures: Fat Layer (Subcutaneous Fat Layer (Subcutaneous N/A Tissue) Exposed: Yes Tissue) Exposed: Yes Fascia: No Fascia: No Tendon: No Tendon: No Neis, Robyne C. ([id number]) Muscle: No Muscle: No Joint: No Joint: No Bone: No Bone: No Epithelialization: None Medium (34-66%) N/A Treatment Notes Electronic Signature(s) Signed: 03/13/2019 4:03:55 PM By: Montey Hora Entered By: Montey Hora on 03/13/2019 09:51:29 Shutter, [name] ([id number]) -------------------------------------------------------------------------------- Multi-Disciplinary Care Plan Details Patient Name: [name]. Date of Service: 03/13/2019 9:15 AM Medical Record Number: [id number] Patient Account Number: [id number] Date of Birth/Sex: 1956-05-05 (63 y.o. F) Treating RN: Montey Hora Primary Care Festus Pursel: Tandy Gaw  Other Clinician: Referring Katianna Mcclenney: Tandy Gaw Treating Nealy Karapetian/Extender: STONE III, HOYT Weeks in Treatment: 3 Active Inactive Abuse / Safety / Falls / Self Care Management Nursing Diagnoses: Potential for falls Goals: Patient will remain injury free related to falls Date Initiated: 02/20/2019 Target Resolution Date: 05/16/2019 Goal Status: Active Interventions: Assess fall risk on admission and as needed Notes: Orientation to the Wound Care Program Nursing Diagnoses: Knowledge deficit related to the wound healing center program Goals: Patient/caregiver will verbalize understanding of the [facility] Date Initiated: 02/20/2019 Target Resolution Date: 05/16/2019 Goal Status: Active Interventions: Provide education on orientation to the wound center Notes: Venous Leg Ulcer Nursing Diagnoses: Actual venous Insuffiency (use after diagnosis is confirmed) Goals: Patient will maintain optimal edema control Date Initiated: 02/20/2019 Target Resolution Date: 05/16/2019 Goal Status: Active Interventions: Assess peripheral edema status every visit. KADISHA, WOLFENBARGER ([id number]) Compression as ordered Notes: Wound/Skin Impairment Nursing Diagnoses: Impaired tissue integrity Goals: Ulcer/skin breakdown will heal within 14 weeks Date Initiated: 02/20/2019 Target Resolution Date: 05/16/2019 Goal Status: Active Interventions: Assess patient/caregiver ability to obtain necessary supplies Assess patient/caregiver ability to perform ulcer/skin care regimen upon admission and as needed Assess ulceration(s) every visit Notes: Electronic Signature(s) Signed: 03/13/2019 4:03:55 PM By: Montey Hora Entered By: Montey Hora on 03/13/2019 09:51:17 Bilger, [name] ([id number]) -------------------------------------------------------------------------------- Pain Assessment Details Patient Name: [name]. Date of Service: 03/13/2019 9:15 AM Medical Record  Number: [id number] Patient Account Number: [id number] Date of Birth/Sex: 08/25/1955 (63 y.o. F) Treating RN: Montey Hora Primary Care Debroah Shuttleworth: Tandy Gaw Other Clinician: Referring Monty Spicher: Tandy Gaw Treating Tyronn Golda/Extender: STONE III, HOYT Weeks in Treatment: 3 Active Problems Location of Pain Severity and Description of Pain Patient Has Paino No Site Locations Pain Management and Medication Current Pain Management: Electronic Signature(s) Signed: 03/13/2019 3:54:12 PM By: Lorine Bears RCP, RRT, CHT Signed: 03/13/2019 4:03:55 PM By: Montey Hora Entered By: Lorine Bears on 03/13/2019 09:38:15 Tardiff, [name] ([id number]) -------------------------------------------------------------------------------- Patient/Caregiver Education Details Patient Name: [name]. Date of Service: 03/13/2019 9:15 AM Medical Record Number: [id number] Patient Account Number: [id number] Date of Birth/Gender: 05/19/1956 (63 y.o. F) Treating RN: Montey Hora Primary Care Physician: Tandy Gaw Other Clinician: Referring Physician: Tandy Gaw Treating Physician/Extender: Melburn Hake, HOYT Weeks in Treatment: 3 Education Assessment Education Provided To: Patient Education Topics Provided Venous: Handouts: Other: need for edema management Methods: Explain/Verbal Responses: State content correctly Electronic Signature(s) Signed: 03/13/2019 4:03:55 PM By: Montey Hora Entered By: Montey Hora on 03/13/2019 09:53:36 Cates, [name] ([id number]) --------------------------------------------------------------------------------  Wound Assessment Details Patient Name: ARMANDE, LEYVAS. Date of Service: 03/13/2019 9:15 AM Medical Record Number: [id number] Patient Account Number: [id number] Date of Birth/Sex: 01/02/1956 (63 y.o. F) Treating RN: Army Melia Primary Care Eldar Robitaille: Tandy Gaw Other Clinician: Referring Rhys Anchondo: Tandy Gaw Treating  Quincee Gittens/Extender: STONE III, HOYT Weeks in Treatment: 3 Wound Status Wound Number: 3 Primary Lymphedema Etiology: Wound Location: Left Lower Leg - Medial Wound Open Wounding Event: Blister Status: Date Acquired: 10/08/2018 Comorbid Cataracts, Lymphedema, Asthma, Sleep Weeks Of Treatment: 3 History: Apnea, Tuberculosis, Hypertension, Gout, Clustered Wound: No Osteoarthritis Photos Wound Measurements Length: (cm) 11 Width: (cm) 14 Depth: (cm) 0.1 Area: (cm) 120.951 Volume: (cm) 12.095 % Reduction in Area: -307.4% % Reduction in Volume: -307.4% Epithelialization: None Tunneling: No Undermining: No Wound Description Full Thickness Without Exposed Support Foul Odo Classification: Structures Slough/F Wound Margin: Thickened Exudate Medium Amount: Exudate Type: Serous Exudate Color: amber r After Cleansing: No ibrino Yes Wound Bed Granulation Amount: Medium (34-66%) Exposed Structure Granulation Quality: Red Fascia Exposed: No Necrotic Amount: Medium (34-66%) Fat Layer (Subcutaneous Tissue) Exposed: Yes Necrotic Quality: Adherent Slough Tendon Exposed: No Muscle Exposed: No Joint Exposed: No Bone Exposed: No Melkonian, Briseis C. ([id number]) Treatment Notes Wound #3 (Left, Medial Lower Leg) Notes Nystatin powder, Silver alginate, ABD, K/C Electronic Signature(s) Signed: 03/13/2019 11:25:26 AM By: Army Melia Entered By: Army Melia on 03/13/2019 09:42:12 Nolting, [name] ([id number]) -------------------------------------------------------------------------------- Wound Assessment Details Patient Name: [name] Date of Service: 03/13/2019 9:15 AM Medical Record Number: [id number] Patient Account Number: [id number] Date of Birth/Sex: [birth year] (63 y.o. F) Treating RN: Army Melia Primary Care Logon Uttech: Tandy Gaw Other Clinician: Referring Khani Paino: Tandy Gaw Treating Zaylynn Rickett/Extender: STONE III, HOYT Weeks in Treatment: 3 Wound Status Wound  Number: 4 Primary Lymphedema Etiology: Wound Location: Right Lower Leg - Medial Wound Open Wounding Event: Gradually Appeared Status: Date Acquired: 02/02/2019 Comorbid Cataracts, Lymphedema, Asthma, Sleep Weeks Of Treatment: 3 History: Apnea, Tuberculosis, Hypertension, Gout, Clustered Wound: No Osteoarthritis Photos Wound Measurements Length: (cm) 4 Width: (cm) 5 Depth: (cm) 0.1 Area: (cm) 15.708 Volume: (cm) 1.571 % Reduction in Area: 58.3% % Reduction in Volume: 58.3% Epithelialization: Medium (34-66%) Tunneling: No Undermining: No Wound Description Full Thickness Without Exposed Support Foul Odo Classification: Structures Slough/F Wound Margin: Indistinct, nonvisible Exudate Large Amount: Exudate Type: Serous Exudate Color: amber r After Cleansing: No ibrino Yes Wound Bed Granulation Amount: Medium (34-66%) Exposed Structure Granulation Quality: Pink Fascia Exposed: No Necrotic Amount: Medium (34-66%) Fat Layer (Subcutaneous Tissue) Exposed: Yes Necrotic Quality: Adherent Slough Tendon Exposed: No Muscle Exposed: No Joint Exposed: No Bone Exposed: No Fleener, Joanann C. ([id number]) Treatment Notes Wound #4 (Right, Medial Lower Leg) Notes Nystatin powder, Silver alginate, ABD, K/C Electronic Signature(s) Signed: 03/13/2019 11:25:26 AM By: Army Melia Entered By: Army Melia on 03/13/2019 09:43:21 Fern, [name] ([id number]) -------------------------------------------------------------------------------- Vitals Details Patient Name: [name]. Date of Service: 03/13/2019 9:15 AM Medical Record Number: [id number] Patient Account Number: [id number] Date of Birth/Sex: Jul 26, 1955 (63 y.o. F) Treating RN: Montey Hora Primary Care Lenah Messenger: Tandy Gaw Other Clinician: Referring Kassidee Narciso: Tandy Gaw Treating Charlaine Utsey/Extender: STONE III, HOYT Weeks in Treatment: 3 Vital Signs Time Taken: 09:35 Temperature (F): 99.0 Height (in):  62 Pulse (bpm): 68 Weight (lbs): 390 Respiratory Rate (breaths/min): 16 Body Mass Index (BMI): 71.3 Blood Pressure (mmHg): 142/68 Reference Range: 80 - 120 mg / dl Electronic Signature(s) Signed: 03/13/2019 3:54:12 PM By: Lorine Bears RCP, RRT, CHT Entered By: Lorine Bears on 03/13/2019 09:38:49

## 2019-03-20 ENCOUNTER — Other Ambulatory Visit
Admission: RE | Admit: 2019-03-20 | Discharge: 2019-03-20 | Disposition: A | Discharge status: Home / Self Care | Payer: Medicaid Other | Source: Ambulatory Visit | Attending: Physician Assistant | Admitting: Physician Assistant

## 2019-03-20 ENCOUNTER — Encounter: Payer: Medicaid Other | Admitting: Physician Assistant

## 2019-03-20 ENCOUNTER — Other Ambulatory Visit: Payer: Self-pay

## 2019-03-20 DIAGNOSIS — L97812 Non-pressure chronic ulcer of other part of right lower leg with fat layer exposed: Secondary | ICD-10-CM | POA: Diagnosis not present

## 2019-03-20 DIAGNOSIS — B999 Unspecified infectious disease: Secondary | ICD-10-CM | POA: Diagnosis not present

## 2019-03-20 NOTE — Progress Notes (Addendum)
LETETIA, TANGO ([id number]) Visit Report for 03/20/2019 Arrival Information Details Patient Name: [name], [name]. Date of Service: 03/20/2019 9:15 AM Medical Record Number: [id number] Patient Account Number: [id number] Date of Birth/Sex: [birth year] (63 y.o. F) Treating RN: Cornell Barman Primary Care Keny Donald: Tandy Gaw Other Clinician: Referring Tannisha Kennington: Tandy Gaw Treating Donise Woodle/Extender: Melburn Hake, HOYT Weeks in Treatment: 4 Visit Information History Since Last Visit Added or deleted any medications: No Patient Arrived: Cane Any new allergies or adverse reactions: No Arrival Time: 09:34 Had a fall or experienced change in No Accompanied By: self activities of daily living that may affect Transfer Assistance: None risk of falls: Patient Identification Verified: Yes Signs or symptoms of abuse/neglect since last visito No Secondary Verification Process Completed: Yes Hospitalized since last visit: No Patient Requires Transmission-Based Precautions: No Implantable device outside of the clinic excluding No Patient Has Alerts: No cellular tissue based products placed in the center since last visit: Has Dressing in Place as Prescribed: Yes Pain Present Now: Yes Electronic Signature(s) Signed: 03/24/2019 1:29:00 PM By: Gretta Cool, BSN, RN, CWS, Kim RN, BSN Entered By: Gretta Cool, BSN, RN, CWS, Kim on 03/20/2019 09:34:52 Koran, [name] ([id number]) -------------------------------------------------------------------------------- Compression Therapy Details Patient Name: Elizabeth Landau C. Date of Service: 03/20/2019 9:15 AM Medical Record Number: [id number] Patient Account Number: [id number] Date of Birth/Sex: 26-Sep-1955 (63 y.o. F) Treating RN: Montey Hora Primary Care Lovie Agresta: Tandy Gaw Other Clinician: Referring Whitt Auletta: Tandy Gaw Treating Camil Hausmann/Extender: STONE III, HOYT Weeks in Treatment: 4 Compression Therapy Performed for Wound Assessment: Wound #3  Left,Medial Lower Leg Performed By: Clinician Montey Hora, RN Compression Type: Four Layer Post Procedure Diagnosis Same as Pre-procedure Electronic Signature(s) Signed: 03/20/2019 2:09:28 PM By: Montey Hora Entered By: Montey Hora on 03/20/2019 10:27:04 [name] ([id number]) -------------------------------------------------------------------------------- Encounter Discharge Information Details Patient Name: [name]. Date of Service: 03/20/2019 9:15 AM Medical Record Number: [id number] Patient Account Number: [id number] Date of Birth/Sex: 05-27-1956 (63 y.o. F) Treating RN: Army Melia Primary Care Navarre Diana: Tandy Gaw Other Clinician: Referring Haiden Clucas: Tandy Gaw Treating Naija Troost/Extender: STONE III, HOYT Weeks in Treatment: 4 Encounter Discharge Information Items Discharge Condition: Stable Ambulatory Status: Cane Discharge Destination: Home Transportation: Private Auto Accompanied By: self Schedule Follow-up Appointment: Yes Clinical Summary of Care: Electronic Signature(s) Signed: 03/20/2019 11:53:25 AM By: Army Melia Entered By: Army Melia on 03/20/2019 10:36:54 Quam, [name] ([id number]) -------------------------------------------------------------------------------- Lower Extremity Assessment Details Patient Name: [name]. Date of Service: 03/20/2019 9:15 AM Medical Record Number: [id number] Patient Account Number: [id number] Date of Birth/Sex: 03/22/1956 (63 y.o. F) Treating RN: Cornell Barman Primary Care Berenise Hunton: Tandy Gaw Other Clinician: Referring Brynna Dobos: Tandy Gaw Treating Donnae Michels/Extender: STONE III, HOYT Weeks in Treatment: 4 Edema Assessment Assessed: [Left: Yes] [Right: Yes] Edema: [Left: Yes] [Right: Yes] Ankle Left: Right: Point of Measurement: cm From Medial Instep 66 cm 69 cm Vascular Assessment Pulses: Dorsalis Pedis Palpable: [Left:Yes] [Right:Yes] Notes Measured approximately center of  calf. Electronic Signature(s) Signed: 03/24/2019 1:29:00 PM By: Gretta Cool, BSN, RN, CWS, Kim RN, BSN Entered By: Gretta Cool, BSN, RN, CWS, Kim on 03/20/2019 09:49:04 [name] ([id number]) -------------------------------------------------------------------------------- Multi Wound Chart Details Patient Name: [name]. Date of Service: 03/20/2019 9:15 AM Medical Record Number: [id number] Patient Account Number: [id number] Date of Birth/Sex: Oct 25, 1955 (63 y.o. F) Treating RN: Montey Hora Primary Care Nickalos Petersen: Tandy Gaw Other Clinician: Referring Iyana Topor: Tandy Gaw Treating Donice Alperin/Extender: STONE III, HOYT Weeks in Treatment: 4 Vital Signs Height(in): 62 Pulse(bpm): 72 Weight(lbs): 390 Blood Pressure(mmHg): 147/82 Body Mass Index(BMI): 71 Temperature(F): 98.8  Respiratory Rate 18 (breaths/min): Photos: [3:No Photos] [4:No Photos] [N/A:N/A] Wound Location: [3:Left Lower Leg - Medial] [4:Right Lower Leg - Medial] [N/A:N/A] Wounding Event: [3:Blister] [4:Gradually Appeared] [N/A:N/A] Primary Etiology: [3:Lymphedema] [4:Lymphedema] [N/A:N/A] Comorbid History: [3:Cataracts, Lymphedema, Asthma, Sleep Apnea, Tuberculosis, Hypertension, Gout, Osteoarthritis] [4:Cataracts, Lymphedema, Asthma, Sleep Apnea, Tuberculosis, Hypertension, Gout, Osteoarthritis] [N/A:N/A] Date Acquired: [3:10/08/2018] [4:02/02/2019] [N/A:N/A] Weeks of Treatment: [3:4] [4:4] [N/A:N/A] Wound Status: [3:Open] [4:Open] [N/A:N/A] Measurements L x W x D [3:40x18x0.1] [4:0.1x0.1x0.1] [N/A:N/A] (cm) Area (cm) : [3:565.487] [4:0.008] [N/A:N/A] Volume (cm) : [3:56.549] [4:0.001] [N/A:N/A] % Reduction in Area: [3:-1804.80%] [4:100.00%] [N/A:N/A] % Reduction in Volume: [3:-1804.60%] [4:100.00%] [N/A:N/A] Classification: [3:Full Thickness Without Exposed Support Structures] [4:Full Thickness Without Exposed Support Structures] [N/A:N/A] Exudate Amount: [3:Large] [4:Large] [N/A:N/A] Exudate Type: [3:Serous]  [4:Serous] [N/A:N/A] Exudate Color: [3:amber] [4:amber] [N/A:N/A] Wound Margin: [3:Thickened] [4:Indistinct, nonvisible] [N/A:N/A] Granulation Amount: [3:Medium (34-66%)] [4:Medium (34-66%)] [N/A:N/A] Granulation Quality: [3:Red] [4:Pink] [N/A:N/A] Necrotic Amount: [3:Medium (34-66%)] [4:Medium (34-66%)] [N/A:N/A] Exposed Structures: [3:Fat Layer (Subcutaneous Tissue) Exposed: Yes Fascia: No Tendon: No Muscle: No Joint: No Bone: No None] [4:Fat Layer (Subcutaneous Tissue) Exposed: Yes Fascia: No Tendon: No Muscle: No Joint: No Bone: No Medium (34-66%)] [N/A:N/A N/A] Treatment Notes EIMAAN, HASLER ([id number]) Electronic Signature(s) Signed: 03/20/2019 2:09:28 PM By: Montey Hora Entered By: Montey Hora on 03/20/2019 10:10:00 [name] ([id number]) -------------------------------------------------------------------------------- [place] Details Patient Name: [name]. Date of Service: 03/20/2019 9:15 AM Medical Record Number: [id number] Patient Account Number: [id number] Date of Birth/Sex: 11-12-1955 (63 y.o. F) Treating RN: Montey Hora Primary Care Dylon Correa: Tandy Gaw Other Clinician: Referring Semone Orlov: Tandy Gaw Treating Yaquelin Langelier/Extender: STONE III, HOYT Weeks in Treatment: 4 Active Inactive Abuse / Safety / Falls / Self Care Management Nursing Diagnoses: Potential for falls Goals: Patient will remain injury free related to falls Date Initiated: 02/20/2019 Target Resolution Date: 05/16/2019 Goal Status: Active Interventions: Assess fall risk on admission and as needed Notes: Orientation to the Wound Care Program Nursing Diagnoses: Knowledge deficit related to the wound healing center program Goals: Patient/caregiver will verbalize understanding of the [facility] Program Date Initiated: 02/20/2019 Target Resolution Date: 05/16/2019 Goal Status: Active Interventions: Provide education on orientation to the wound  center Notes: Venous Leg Ulcer Nursing Diagnoses: Actual venous Insuffiency (use after diagnosis is confirmed) Goals: Patient will maintain optimal edema control Date Initiated: 02/20/2019 Target Resolution Date: 05/16/2019 Goal Status: Active Interventions: Assess peripheral edema status every visit. CURRAN, POTENZA ([id number]) Compression as ordered Notes: Wound/Skin Impairment Nursing Diagnoses: Impaired tissue integrity Goals: Ulcer/skin breakdown will heal within 14 weeks Date Initiated: 02/20/2019 Target Resolution Date: 05/16/2019 Goal Status: Active Interventions: Assess patient/caregiver ability to obtain necessary supplies Assess patient/caregiver ability to perform ulcer/skin care regimen upon admission and as needed Assess ulceration(s) every visit Notes: Electronic Signature(s) Signed: 03/20/2019 2:09:28 PM By: Montey Hora Entered By: Montey Hora on 03/20/2019 10:09:50 [name] ([id number]) -------------------------------------------------------------------------------- Pain Assessment Details Patient Name: [name]. Date of Service: 03/20/2019 9:15 AM Medical Record Number: [id number] Patient Account Number: [id number] Date of Birth/Sex: 12/09/1955 (63 y.o. F) Treating RN: Cornell Barman Primary Care Gertha Lichtenberg: Tandy Gaw Other Clinician: Referring Monterio Bob: Tandy Gaw Treating Janaia Kozel/Extender: STONE III, HOYT Weeks in Treatment: 4 Active Problems Location of Pain Severity and Description of Pain Patient Has Paino Yes Site Locations Pain Location: Pain in Ulcers Rate the pain. Current Pain Level: 4 Character of Pain Describe the Pain: Aching, Shooting, Stabbing Pain Management and Medication Current Pain Management: Electronic Signature(s) Signed: 03/24/2019 1:29:00 PM By: Gretta Cool, BSN, RN,  CWS, Kim RN, BSN Entered By: Gretta Cool, BSN, RN, CWS, Kim on 03/20/2019 09:35:32 [name]  ([id number]) -------------------------------------------------------------------------------- Patient/Caregiver Education Details Patient Name: [name] Date of Service: 03/20/2019 9:15 AM Medical Record Number: [id number] Patient Account Number: [id number] Date of Birth/Gender: 09-May-1956 (63 y.o. F) Treating RN: Montey Hora Primary Care Physician: Tandy Gaw Other Clinician: Referring Physician: Tandy Gaw Treating Physician/Extender: Sharalyn Ink in Treatment: 4 Education Assessment Education Provided To: Patient Education Topics Provided Venous: Handouts: Other: edema management Methods: Explain/Verbal Responses: State content correctly Electronic Signature(s) Signed: 03/20/2019 2:09:28 PM By: Montey Hora Entered By: Montey Hora on 03/20/2019 10:27:34 Natal, [name] ([id number]) -------------------------------------------------------------------------------- Wound Assessment Details Patient Name: Elizabeth Landau C. Date of Service: 03/20/2019 9:15 AM Medical Record Number: [id number] Patient Account Number: [id number] Date of Birth/Sex: August 15, 1955 (63 y.o. F) Treating RN: Cornell Barman Primary Care Aniesa Boback: Tandy Gaw Other Clinician: Referring Carola Viramontes: Tandy Gaw Treating Nikita Humble/Extender: STONE III, HOYT Weeks in Treatment: 4 Wound Status Wound Number: 3 Primary Lymphedema Etiology: Wound Location: Left Lower Leg - Medial Wound Open Wounding Event: Blister Status: Date Acquired: 10/08/2018 Comorbid Cataracts, Lymphedema, Asthma, Sleep Weeks Of Treatment: 4 History: Apnea, Tuberculosis, Hypertension, Gout, Clustered Wound: No Osteoarthritis Wound Measurements Length: (cm) 40 Width: (cm) 18 Depth: (cm) 0.1 Area: (cm) 565.487 Volume: (cm) 56.549 % Reduction in Area: -1804.8% % Reduction in Volume: -1804.6% Epithelialization: None Tunneling: No Undermining: No Wound Description Full Thickness Without Exposed  Support Classification: Structures Wound Margin: Thickened Exudate Large Amount: Exudate Type: Serous Exudate Color: amber Foul Odor After Cleansing: No Slough/Fibrino Yes Wound Bed Granulation Amount: Medium (34-66%) Exposed Structure Granulation Quality: Red Fascia Exposed: No Necrotic Amount: Medium (34-66%) Fat Layer (Subcutaneous Tissue) Exposed: Yes Necrotic Quality: Adherent Slough Tendon Exposed: No Muscle Exposed: No Joint Exposed: No Bone Exposed: No Electronic Signature(s) Signed: 03/20/2019 2:09:28 PM By: Montey Hora Signed: 03/24/2019 1:29:00 PM By: Gretta Cool, BSN, RN, CWS, Kim RN, BSN Entered By: Montey Hora on 03/20/2019 10:08:52 [name] ([id number]) -------------------------------------------------------------------------------- Wound Assessment Details Patient Name: MYTHILI, KNEISEL C. Date of Service: 03/20/2019 9:15 AM Medical Record Number: [id number] Patient Account Number: [id number] Date of Birth/Sex: 10/25/1955 (63 y.o. F) Treating RN: Cornell Barman Primary Care Bryam Taborda: Tandy Gaw Other Clinician: Referring Azaliah Carrero: Tandy Gaw Treating Natarsha Hurwitz/Extender: STONE III, HOYT Weeks in Treatment: 4 Wound Status Wound Number: 4 Primary Lymphedema Etiology: Wound Location: Right Lower Leg - Medial Wound Open Wounding Event: Gradually Appeared Status: Date Acquired: 02/02/2019 Comorbid Cataracts, Lymphedema, Asthma, Sleep Weeks Of Treatment: 4 History: Apnea, Tuberculosis, Hypertension, Gout, Clustered Wound: No Osteoarthritis Wound Measurements Length: (cm) 0.1 Width: (cm) 0.1 Depth: (cm) 0.1 Area: (cm) 0.008 Volume: (cm) 0.001 % Reduction in Area: 100% % Reduction in Volume: 100% Epithelialization: Medium (34-66%) Tunneling: No Undermining: No Wound Description Full Thickness Without Exposed Support Classification: Structures Wound Margin: Indistinct, nonvisible Exudate Large Amount: Exudate Type: Serous Exudate Color:  amber Foul Odor After Cleansing: No Slough/Fibrino Yes Wound Bed Granulation Amount: Medium (34-66%) Exposed Structure Granulation Quality: Pink Fascia Exposed: No Necrotic Amount: Medium (34-66%) Fat Layer (Subcutaneous Tissue) Exposed: Yes Necrotic Quality: Adherent Slough Tendon Exposed: No Muscle Exposed: No Joint Exposed: No Bone Exposed: No Treatment Notes Wound #4 (Right, Medial Lower Leg) Notes Nystatin powder, Silver alginate, ABD, Electronic Signature(s) Signed: 03/20/2019 2:09:28 PM By: Montey Hora Signed: 03/24/2019 1:29:00 PM By: Gretta Cool, BSN, RN, CWS, Kim RN, BSN Entered By: Montey Hora on 03/20/2019 [place], [place] ([id number], SABO ([id number]) -------------------------------------------------------------------------------- [place] Details Patient Name: Minette Brine,  Avaiah C. Date of Service: 03/20/2019 9:15 AM Medical Record Number: [id number] Patient Account Number: [id number] Date of Birth/Sex: [birth year] (63 y.o. F) Treating RN: Cornell Barman Primary Care Orazio Weller: Tandy Gaw Other Clinician: Referring Gelisa Tieken: Tandy Gaw Treating Liesel Peckenpaugh/Extender: STONE III, HOYT Weeks in Treatment: 4 Vital Signs Time Taken: 09:35 Temperature (F): 98.8 Height (in): 62 Pulse (bpm): 72 Weight (lbs): 390 Respiratory Rate (breaths/min): 18 Body Mass Index (BMI): 71.3 Blood Pressure (mmHg): 147/82 Reference Range: 80 - 120 mg / dl Electronic Signature(s) Signed: 03/24/2019 1:29:00 PM By: Gretta Cool, BSN, RN, CWS, Kim RN, BSN Entered By: Gretta Cool, BSN, RN, CWS, Kim on 03/20/2019 09:40:05

## 2019-03-20 NOTE — Progress Notes (Addendum)
[name], [name] ([id number]) Visit Report for 03/20/2019 Chief Complaint Document Details Patient Name: Elizabeth, [name]. Date of Service: 03/20/2019 9:15 AM Medical Record Number: [id number] Patient Account Number: [id number] Date of Birth/Sex: Dec 04, 1955 (63 y.o. F) Treating RN: Montey Hora Primary Care Provider: Tandy Gaw Other Clinician: Referring Provider: Tandy Gaw Treating Provider/Extender: Melburn Hake, HOYT Weeks in Treatment: 4 Information Obtained from: Patient Chief Complaint Left LE Ulcer Electronic Signature(s) Signed: 03/20/2019 9:32:10 AM By: Worthy Keeler PA-C Entered By: Worthy Keeler on 03/20/2019 09:32:10 Fermin, [name] ([id number]) -------------------------------------------------------------------------------- HPI Details Patient Name: [name]. Date of Service: 03/20/2019 9:15 AM Medical Record Number: [id number] Patient Account Number: [id number] Date of Birth/Sex: Oct 18, 1955 (63 y.o. F) Treating RN: Montey Hora Primary Care Provider: Tandy Gaw Other Clinician: Referring Provider: Tandy Gaw Treating Provider/Extender: STONE III, HOYT Weeks in Treatment: 4 History of Present Illness HPI Description: The patient is a 63 year old female with history of hypertension and a long-standing history of bilateral lower extremity lymphedema (first presented on 4/2) . She has had open ulcers in the past which have always responded to compression therapy. She had briefly been to a lymphedema clinic in the past which helped her at the time. this time around she stopped treatment of her lymphedema pumps approximately 2 weeks ago because of some pain in the knees and then noticed the right leg getting worse. She was seen by her PCP who put her on clindamycin 4 times a day 2 days ago. The patient has seen [facility] and Dr. Delana Meyer had seen her last year where a vascular study including venous and arterial duplex studies were within normal limits. he  had recommended compression stockings and lymphedema pumps and the patient has been using this in about 2 weeks ago. She is known to be diabetic but in the past few time she's gone to her primary care doctor her hemoglobin A1c has been normal. 02/11/2015 - after her last visit she took my advice and went to the ER regarding the progressive cellulitis of her right lower extremity and she was admitted between July 17 and 22nd. She received IV antibiotics and then was sent home on a course of steroid-induced and oral antibiotics. She has improved much since then. 02/17/2015 -- she has been doing fine and the weeping of her legs has remarkably gone down. She has no fresh issues. READMISSION 01/15/18 This patient was given this clinic before most recently in 2016 seen by Dr. Con Memos. She has massive bilateral lymphedema and over the last 2 months this had weeping edema out of the left leg. She has compression pumps but her compliance with these has been minimal. She has [facility] they've been using TCA/ABDs/kerlix under an Ace wrap.she has had recent problems with cellulitis. She was apparently seen in the ER and 12/23/17 and given clindamycin. She was then followed by her primary doctor and given doxycycline and Keflex. The pain seems to have settled down. In April 2018 the patient had arterial studies done at [facility]. This showed triphasic waveforms throughout the right leg and mostly triphasic waveforms on the left except for monophasic at the posterior tibial artery distally. She was not felt to have evidence of right lower extremity arterial stenosis or significant problems on the left side. 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 She also had a right lower extremity venous Doppler in January 2018 this was limited by the patient's body habitus and lymphedema. Most of the  proximal veins were not visualized The patient presents with an area of  denuded skin on the anterior medial part of the left calf. There is weeping edema fluid here. 01/22/18; the patient has somewhat better edema control using her compression pumps twice a day and as a result she has much better epithelialization on the left anterior calf area. Only a small open area remains. 01/29/18; the patient has been compliant with her compression pumps. Both the areas on her calf that healed. The remaining area on the left anterior leg is fully epithelialized Readmission: 02/20/2019 upon evaluation today patient presents for reevaluation due to issues that she is having with the bilateral lower extremities. She actually has wounds open on both legs. On the right she has an area in the crease of her leg on the right around the knee region which is actually draining quite a bit and actually has some fungal type appearance to it. She has been on nystatin powder that seems to have helped to some degree. In regard to the left lower extremity this is actually in the [place]. ([id number]) lower portion of her leg closer to the ankle and again is continuing to drain as well unfortunately. There does not appear to be any signs of active infection at this time which is good news. No fevers, chills, nausea, vomiting, or diarrhea. She tells me that since she was seen last year she is actually been doing quite well for the most part with regard to her lower extremities. Unfortunately she now is experiencing a little bit more drainage at this time. She is concerned about getting this under control so that it does not get significantly worse. 02/27/2019 on evaluation today patient appears to be doing somewhat better in regard to her bilateral lower extremity wounds. She has been tolerating the dressing changes without complication. Fortunately there is no signs of active infection at this point. No fevers, chills, nausea, vomiting, or diarrhea. She did get her dressing supplies which is  excellent news she was extremely excited to get these. She also got paperwork from [employer] for their financial assistance program where they may be able to help her out in the future if needed with supplies at discounted prices. 03/06/2019 on evaluation today patient appears to be doing a little worse with regard to both areas of weeping on her bilateral lower extremities. This is around the right medial knee and just above the left ankle. With that being said she is unfortunately not doing as well as I would like to see. I feel like she may need to potentially go see someone at the lymphedema clinic as the wraps that she needs or even beyond what we can do here at the wound care center. She really does not have wounds she just has open areas of weeping that are causing some difficulty for her. Subsequently because of this and the moisture I am concerned about the potential for infection I am going to likely give her a prophylactic antibiotic today, Keflex, just to be on the safe side. Nonetheless again there is no obvious signs of active infection at this time. 03/13/2019 on evaluation today patient appears to be doing well with regard to her bilateral lower extremities where she has been weeping compared to even last week's evaluation. I see some areas of new skin growth which is excellent and overall I am very pleased with how things seem to be progressing. No fevers, chills, nausea, vomiting, or diarrhea. 03/20/2019 on evaluation  today patient unfortunately is continuing to have issues with significant edema of the left lower extremity. Her right side seems to be doing much better. Unfortunately her left side is showing increased weeping of the lower portion of her leg. This is quite unfortunate obviously we were hoping to get her into the lymphedema clinic they really do not seem to when I see her how if she is draining. Despite the fact this is really not wound related but more lymphedema  weeping related. Nonetheless I do not know that this can be helpful for her to even go for that appointment since again I am not sure there is much that they would actually do at this point. We may need to try a 4 layer compression wrap as best we can on her leg. She is on the Augmentin currently although I am still concerned about whether or not there could be potentially something going on infection wise I would obtain a culture though I understand is not the best being that is a surface culture I just 1 to make sure I do not seem to be missing anything. Electronic Signature(s) Signed: 03/20/2019 10:18:55 AM By: Worthy Keeler PA-C Entered By: Worthy Keeler on 03/20/2019 10:18:55 Braid, [name] ([id number]) -------------------------------------------------------------------------------- Physical Exam Details Patient Name: ALAIZA, KASCH C. Date of Service: 03/20/2019 9:15 AM Medical Record Number: [id number] Patient Account Number: [id number] Date of Birth/Sex: [birth year] (63 y.o. F) Treating RN: Montey Hora Primary Care Provider: Tandy Gaw Other Clinician: Referring Provider: Tandy Gaw Treating Provider/Extender: STONE III, HOYT Weeks in Treatment: 4 Constitutional Obese and well-hydrated in no acute distress. Respiratory normal breathing without difficulty. clear to auscultation bilaterally. Cardiovascular regular rate and rhythm with normal S1, S2. Psychiatric this patient is able to make decisions and demonstrates good insight into disease process. Alert and Oriented x 3. pleasant and cooperative. Notes Patient's wound bed currently again is just more of a large area of weeping on the left lower extremity there does not appear to be any signs of active infection which is good news. With that being said she could be experiencing some issues with mild cellulitis and that may be why she is having increased weeping as well. Nonetheless I did obtain a culture today I  will have her continue the Augmentin for the time being. We will make any changes as necessary when I get the results of that culture back next week. Electronic Signature(s) Signed: 03/20/2019 10:19:37 AM By: Worthy Keeler PA-C Entered By: Worthy Keeler on 03/20/2019 10:19:37 Shehadeh, [name] ([id number]) -------------------------------------------------------------------------------- Physician Orders Details Patient Name: [name] Date of Service: 03/20/2019 9:15 AM Medical Record Number: [id number] Patient Account Number: [id number] Date of Birth/Sex: 18-Mar-1956 (63 y.o. F) Treating RN: Montey Hora Primary Care Provider: Tandy Gaw Other Clinician: Referring Provider: Tandy Gaw Treating Provider/Extender: STONE III, HOYT Weeks in Treatment: 4 Verbal / Phone Orders: No Diagnosis Coding ICD-10 Coding Code Description I89.0 Lymphedema, not elsewhere classified L97.822 Non-pressure chronic ulcer of other part of left lower leg with fat layer exposed L97.812 Non-pressure chronic ulcer of other part of right lower leg with fat layer exposed Wound Cleansing Wound #3 Left,Medial Lower Leg o Dial antibacterial soap, wash wounds, rinse and pat dry prior to dressing wounds o May Shower, gently pat wound dry prior to applying new dressing. Wound #4 Right,Medial Lower Leg o Dial antibacterial soap, wash wounds, rinse and pat dry prior to dressing wounds o May Shower, gently pat wound dry prior to  applying new dressing. Skin Barriers/Peri-Wound Care Wound #3 Left,Medial Lower Leg o Antifungal powder-Nystatin Wound #4 Right,Medial Lower Leg o Antifungal powder-Nystatin Primary Wound Dressing Wound #3 Left,Medial Lower Leg o XtraSorb Wound #4 Right,Medial Lower Leg o Silver Alginate Secondary Dressing Wound #3 Left,Medial Lower Leg o ABD pad Wound #4 Right,Medial Lower Leg o ABD pad Dressing Change Frequency Wound #3 Left,Medial Lower Leg o  Change Dressing Monday, Wednesday, Friday Wound #4 Right,Medial Lower Leg o Change Dressing Monday, Wednesday, Friday SHAWANDA, FREILICH ([id number]) Follow-up Appointments o Return Appointment in 1 week. o Nurse Visit as needed - Monday and Wednesday Edema Control Wound #3 Left,Medial Lower Leg o 4-Layer Compression System - Left Lower Extremity. Laboratory o Bacteria identified in Wound by Culture (MICRO) oooo LOINC Code: [id number] oooo Convenience Name: Wound culture routine Patient Medications Allergies: ibuprofen, ACE Inhibitors Notifications Medication Indication Start End Cipro 03/24/2019 DOSE 1 - oral 500 mg tablet - 1 tablet oral taken 2 times a day for 14 days Electronic Signature(s) Signed: 03/24/2019 1:42:50 PM By: Worthy Keeler PA-C Previous Signature: 03/20/2019 1:33:12 PM Version By: Worthy Keeler PA-C Previous Signature: 03/20/2019 2:09:28 PM Version By: Montey Hora Entered By: Worthy Keeler on 03/24/2019 13:42:49 Matlock, [name] ([id number]) -------------------------------------------------------------------------------- Problem List Details Patient Name: [name] Date of Service: 03/20/2019 9:15 AM Medical Record Number: [id number] Patient Account Number: [id number] Date of Birth/Sex: Apr 30, 1956 (63 y.o. F) Treating RN: Montey Hora Primary Care Provider: Tandy Gaw Other Clinician: Referring Provider: Tandy Gaw Treating Provider/Extender: Melburn Hake, HOYT Weeks in Treatment: 4 Active Problems ICD-10 Evaluated Encounter Code Description Active Date Today Diagnosis I89.0 Lymphedema, not elsewhere classified 02/20/2019 No Yes L97.822 Non-pressure chronic ulcer of other part of left lower leg with 02/20/2019 No Yes fat layer exposed L97.812 Non-pressure chronic ulcer of other part of right lower leg 02/20/2019 No Yes with fat layer exposed Inactive Problems Resolved Problems Electronic Signature(s) Signed: 03/20/2019 9:32:03 AM By:  Worthy Keeler PA-C Entered By: Worthy Keeler on 03/20/2019 09:32:03 Jani, [name] ([id number]) -------------------------------------------------------------------------------- Progress Note Details Patient Name: [name]. Date of Service: 03/20/2019 9:15 AM Medical Record Number: [id number] Patient Account Number: [id number] Date of Birth/Sex: 1956/02/02 (63 y.o. F) Treating RN: Montey Hora Primary Care Provider: Tandy Gaw Other Clinician: Referring Provider: Tandy Gaw Treating Provider/Extender: STONE III, HOYT Weeks in Treatment: 4 Subjective Chief Complaint Information obtained from Patient Left LE Ulcer History of Present Illness (HPI) The patient is a 63 year old female with history of hypertension and a long-standing history of bilateral lower extremity lymphedema (first presented on 4/2) . She has had open ulcers in the past which have always responded to compression therapy. She had briefly been to a lymphedema clinic in the past which helped her at the time. this time around she stopped treatment of her lymphedema pumps approximately 2 weeks ago because of some pain in the knees and then noticed the right leg getting worse. She was seen by her PCP who put her on clindamycin 4 times a day 2 days ago. The patient has seen [facility] and Dr. Delana Meyer had seen her last year where a vascular study including venous and arterial duplex studies were within normal limits. he had recommended compression stockings and lymphedema pumps and the patient has been using this in about 2 weeks ago. She is known to be diabetic but in the past few time she's gone to her primary care doctor her hemoglobin A1c has been normal. 02/11/2015 - after her last  visit she took my advice and went to the ER regarding the progressive cellulitis of her right lower extremity and she was admitted between July 17 and 22nd. She received IV antibiotics and then was sent home on a course of  steroid-induced and oral antibiotics. She has improved much since then. 02/17/2015 -- she has been doing fine and the weeping of her legs has remarkably gone down. She has no fresh issues. READMISSION 01/15/18 This patient was given this clinic before most recently in 2016 seen by Dr. Con Memos. She has massive bilateral lymphedema and over the last 2 months this had weeping edema out of the left leg. She has compression pumps but her compliance with these has been minimal. She has [facility] they've been using TCA/ABDs/kerlix under an Ace wrap.she has had recent problems with cellulitis. She was apparently seen in the ER and 12/23/17 and given clindamycin. She was then followed by her primary doctor and given doxycycline and Keflex. The pain seems to have settled down. In April 2018 the patient had arterial studies done at [facility]. This showed triphasic waveforms throughout the right leg and mostly triphasic waveforms on the left except for monophasic at the posterior tibial artery distally. She was not felt to have evidence of right lower extremity arterial stenosis or significant problems on the left side. 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 She also had a right lower extremity venous Doppler in January 2018 this was limited by the patient's body habitus and lymphedema. Most of the proximal veins were not visualized The patient presents with an area of denuded skin on the anterior medial part of the left calf. There is weeping edema fluid here. 01/22/18; the patient has somewhat better edema control using her compression pumps twice a day and as a result she has much better epithelialization on the left anterior calf area. Only a small open area remains. 01/29/18; the patient has been compliant with her compression pumps. Both the areas on her calf that healed. The remaining area on the left anterior leg is fully epithelialized GLORIE, CHESEBRO. ([id number]) Readmission: 02/20/2019 upon evaluation today patient presents for reevaluation due to issues that she is having with the bilateral lower extremities. She actually has wounds open on both legs. On the right she has an area in the crease of her leg on the right around the knee region which is actually draining quite a bit and actually has some fungal type appearance to it. She has been on nystatin powder that seems to have helped to some degree. In regard to the left lower extremity this is actually in the lower portion of her leg closer to the ankle and again is continuing to drain as well unfortunately. There does not appear to be any signs of active infection at this time which is good news. No fevers, chills, nausea, vomiting, or diarrhea. She tells me that since she was seen last year she is actually been doing quite well for the most part with regard to her lower extremities. Unfortunately she now is experiencing a little bit more drainage at this time. She is concerned about getting this under control so that it does not get significantly worse. 02/27/2019 on evaluation today patient appears to be doing somewhat better in regard to her bilateral lower extremity wounds. She has been tolerating the dressing changes without complication. Fortunately there is no signs of active infection at this point. No fevers, chills, nausea, vomiting,  or diarrhea. She did get her dressing supplies which is excellent news she was extremely excited to get these. She also got paperwork from [employer] for their financial assistance program where they may be able to help her out in the future if needed with supplies at discounted prices. 03/06/2019 on evaluation today patient appears to be doing a little worse with regard to both areas of weeping on her bilateral lower extremities. This is around the right medial knee and just above the left ankle. With that being said she is unfortunately not doing as  well as I would like to see. I feel like she may need to potentially go see someone at the lymphedema clinic as the wraps that she needs or even beyond what we can do here at the wound care center. She really does not have wounds she just has open areas of weeping that are causing some difficulty for her. Subsequently because of this and the moisture I am concerned about the potential for infection I am going to likely give her a prophylactic antibiotic today, Keflex, just to be on the safe side. Nonetheless again there is no obvious signs of active infection at this time. 03/13/2019 on evaluation today patient appears to be doing well with regard to her bilateral lower extremities where she has been weeping compared to even last week's evaluation. I see some areas of new skin growth which is excellent and overall I am very pleased with how things seem to be progressing. No fevers, chills, nausea, vomiting, or diarrhea. 03/20/2019 on evaluation today patient unfortunately is continuing to have issues with significant edema of the left lower extremity. Her right side seems to be doing much better. Unfortunately her left side is showing increased weeping of the lower portion of her leg. This is quite unfortunate obviously we were hoping to get her into the lymphedema clinic they really do not seem to when I see her how if she is draining. Despite the fact this is really not wound related but more lymphedema weeping related. Nonetheless I do not know that this can be helpful for her to even go for that appointment since again I am not sure there is much that they would actually do at this point. We may need to try a 4 layer compression wrap as best we can on her leg. She is on the Augmentin currently although I am still concerned about whether or not there could be potentially something going on infection wise I would obtain a culture though I understand is not the best being that is a surface culture I  just 1 to make sure I do not seem to be missing anything. Patient History Information obtained from Patient. Family History Cancer - Father, Diabetes - Father, Heart Disease - Mother, Hypertension - Mother,Father, Seizures - Mother, Stroke - Mother, Thyroid Problems - Mother,Siblings, No family history of Hereditary Spherocytosis, Kidney Disease, Lung Disease, Tuberculosis. Social History Former smoker - uses snuff, Marital Status - Single, Alcohol Use - Rarely, Drug Use - No History, Caffeine Use - Daily. Medical History Eyes Patient has history of Cataracts Denies history of Glaucoma, Optic Neuritis Ear/Nose/Mouth/Throat Denies history of Chronic sinus problems/congestion, Middle ear problems Hematologic/Lymphatic Patient has history of Lymphedema Salameh, Emeline C. 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 Denies history of Aspiration, Chronic Obstructive Pulmonary Disease (COPD), Pneumothorax Cardiovascular  Patient has history of Hypertension Denies history of Angina, Arrhythmia, Congestive Heart Failure, Coronary Artery Disease, Deep Vein Thrombosis, Hypotension, Myocardial Infarction, Peripheral Arterial Disease, Peripheral Venous Disease, Phlebitis, Vasculitis Gastrointestinal Denies history of Cirrhosis , Colitis, Crohn s, Hepatitis A, Hepatitis B, Hepatitis C Endocrine Denies history of Type I Diabetes, Type II Diabetes Genitourinary Denies history of End Stage Renal Disease Immunological Denies history of Lupus Erythematosus, Raynaud s, Scleroderma Integumentary (Skin) Denies history of History of Burn, History of pressure wounds Musculoskeletal Patient has history of Gout, Osteoarthritis Denies history of Rheumatoid Arthritis, Osteomyelitis Neurologic Denies history of Dementia, Neuropathy,  Quadriplegia, Paraplegia, Seizure Disorder Oncologic Denies history of Received Chemotherapy, Received Radiation Psychiatric Denies history of Anorexia/bulimia, Confinement Anxiety Review of Systems (ROS) Constitutional Symptoms (General Health) Denies complaints or symptoms of Fatigue, Fever, Chills, Marked Weight Change. Respiratory Denies complaints or symptoms of Chronic or frequent coughs, Shortness of Breath. Cardiovascular Complains or has symptoms of LE edema. Denies complaints or symptoms of Chest pain. Psychiatric Denies complaints or symptoms of Anxiety, Claustrophobia. Objective Constitutional Obese and well-hydrated in no acute distress. Vitals Time Taken: 9:35 AM, Height: 62 in, Weight: 390 lbs, BMI: 71.3, Temperature: 98.8 F, Pulse: 72 bpm, Respiratory Rate: 18 breaths/min, Blood Pressure: 147/82 mmHg. Respiratory normal breathing without difficulty. clear to auscultation bilaterally. Cardiovascular MALKE, FANTI. ([id number]) regular rate and rhythm with normal S1, S2. Psychiatric this patient is able to make decisions and demonstrates good insight into disease process. Alert and Oriented x 3. pleasant and cooperative. General Notes: Patient's wound bed currently again is just more of a large area of weeping on the left lower extremity there does not appear to be any signs of active infection which is good news. With that being said she could be experiencing some issues with mild cellulitis and that may be why she is having increased weeping as well. Nonetheless I did obtain a culture today I will have her continue the Augmentin for the time being. We will make any changes as necessary when I get the results of that culture back next week. Integumentary (Hair, Skin) Wound #3 status is Open. Original cause of wound was Blister. The wound is located on the Left,Medial Lower Leg. The wound measures 40cm length x 18cm width x 0.1cm depth; 565.487cm^2 area and  56.549cm^3 volume. There is Fat Layer (Subcutaneous Tissue) Exposed exposed. There is no tunneling or undermining noted. There is a large amount of serous drainage noted. The wound margin is thickened. There is medium (34-66%) red granulation within the wound bed. There is a medium (34-66%) amount of necrotic tissue within the wound bed including Adherent Slough. Wound #4 status is Open. Original cause of wound was Gradually Appeared. The wound is located on the Right,Medial Lower Leg. The wound measures 0.1cm length x 0.1cm width x 0.1cm depth; 0.008cm^2 area and 0.001cm^3 volume. There is Fat Layer (Subcutaneous Tissue) Exposed exposed. There is no tunneling or undermining noted. There is a large amount of serous drainage noted. The wound margin is indistinct and nonvisible. There is medium (34-66%) pink granulation within the wound bed. There is a medium (34-66%) amount of necrotic tissue within the wound bed including Adherent Slough. Assessment Active Problems ICD-10 Lymphedema, not elsewhere classified Non-pressure chronic ulcer of other part of left lower leg with fat layer exposed Non-pressure chronic ulcer of other part of right lower leg with fat layer exposed Procedures Wound #3 Pre-procedure diagnosis of Wound #3 is a Lymphedema located on the Left,Medial Lower Leg . There  was a Four Layer Compression Therapy Procedure by Montey Hora, RN. Post procedure Diagnosis Wound #3: Same as Pre-Procedure Plan Wound Cleansing: SAIRY, LEWI ([id number]) Wound #3 Left,Medial Lower Leg: Dial antibacterial soap, wash wounds, rinse and pat dry prior to dressing wounds May Shower, gently pat wound dry prior to applying new dressing. Wound #4 Right,Medial Lower Leg: Dial antibacterial soap, wash wounds, rinse and pat dry prior to dressing wounds May Shower, gently pat wound dry prior to applying new dressing. Skin Barriers/Peri-Wound Care: Wound #3 Left,Medial Lower Leg: Antifungal  powder-Nystatin Wound #4 Right,Medial Lower Leg: Antifungal powder-Nystatin Primary Wound Dressing: Wound #3 Left,Medial Lower Leg: XtraSorb Wound #4 Right,Medial Lower Leg: Silver Alginate Secondary Dressing: Wound #3 Left,Medial Lower Leg: ABD pad Wound #4 Right,Medial Lower Leg: ABD pad Dressing Change Frequency: Wound #3 Left,Medial Lower Leg: Change Dressing Monday, Wednesday, Friday Wound #4 Right,Medial Lower Leg: Change Dressing Monday, Wednesday, Friday Follow-up Appointments: Return Appointment in 1 week. Nurse Visit as needed - Monday and Wednesday Edema Control: Wound #3 Left,Medial Lower Leg: 4-Layer Compression System - Left Lower Extremity. Laboratory ordered were: Wound culture routine The following medication(s) was prescribed: Cipro oral 500 mg tablet 1 1 tablet oral taken 2 times a day for 14 days starting 03/24/2019 1. I would recommend that we go ahead and see about initiation of a 4 layer compression wrap for the patient. We will do this the best we can on the left lower extremity and hopefully this will allow the area to cease as far as some of the drainage is concerned. 2. I would recommend as well that we go ahead and continue the Augmentin for the time being. We will make any adjustments as we need to based on the results of the culture which were obtained today. 3. With regard to the right leg we will continue with the same treatment plan at this time since she seems to be doing well in this regard. 4. If anything worsens or she develops any fevers, chills, nausea, vomiting, or diarrhea patient is to go to the ER ASAP due to cellulitis that would be my concern and the possibility of becoming septic. We will see patient back for reevaluation in 1 week here in the clinic. If anything worsens or changes patient will contact our office for additional recommendations. Electronic Signature(s) Signed: 03/24/2019 1:48:00 PM By: Worthy Keeler PA-C Previous  Signature: 03/20/2019 10:20:33 AM Version By: Ceasar Lund, [place] ([id number]) Entered By: Worthy Keeler on 03/24/2019 13:48:00 Skillen, [name] ([id number]) -------------------------------------------------------------------------------- ROS/PFSH Details Patient Name: [name] Date of Service: 03/20/2019 9:15 AM Medical Record Number: [id number] Patient Account Number: [id number] Date of Birth/Sex: [birth year] (63 y.o. 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 Negative for: Aspiration; Chronic Obstructive Pulmonary Disease (COPD); Pneumothorax Cardiovascular Complaints and Symptoms: Positive for: LE edema Negative for: Chest pain Medical History: Positive for: Hypertension Negative for: Angina; Arrhythmia; Congestive Heart Failure; Coronary Artery Disease; Deep Vein Thrombosis; Hypotension; Myocardial Infarction; Peripheral Arterial Disease; Peripheral Venous Disease; Phlebitis; Vasculitis Psychiatric Complaints and Symptoms: Negative for: Anxiety; Claustrophobia Medical History: Negative for:  Anorexia/bulimia; Confinement Anxiety Eyes Medical History: Positive for: Cataracts Negative for: Glaucoma; Optic Neuritis Ear/Nose/Mouth/Throat Medical History: Negative for: Chronic sinus problems/congestion; Middle ear problems Hematologic/Lymphatic Medical History: Positive for: Lymphedema [name], [name]. ([id number]) Negative for: Anemia;  Hemophilia; Human Immunodeficiency Virus; Sickle Cell Disease Gastrointestinal Medical History: Negative for: Cirrhosis ; Colitis; Crohnos; Hepatitis A; Hepatitis B; Hepatitis C Endocrine Medical History: Negative for: Type I Diabetes; Type II Diabetes Genitourinary Medical History: Negative for: End Stage Renal Disease Immunological Medical History: Negative for: Lupus Erythematosus; Raynaudos; Scleroderma Integumentary (Skin) Medical History: Negative for: History of Burn; History of pressure wounds Musculoskeletal Medical History: Positive for: Gout; Osteoarthritis Negative for: Rheumatoid Arthritis; Osteomyelitis Neurologic Medical History: Negative for: Dementia; Neuropathy; Quadriplegia; Paraplegia; Seizure Disorder Oncologic Medical History: Negative for: Received Chemotherapy; Received Radiation HBO Extended History Items Eyes: Cataracts Immunizations Pneumococcal Vaccine: Received Pneumococcal Vaccination: Yes Implantable Devices No devices added Family and Social History Cancer: Yes - Father; Diabetes: Yes - Father; Heart Disease: Yes - Mother; Hereditary Spherocytosis: No; Hypertension: Yes - Mother,Father; Kidney Disease: No; Lung Disease: No; Seizures: Yes - Mother; Stroke: Yes - Mother; Thyroid Problems: Yes - Mother,Siblings; Tuberculosis: No; Former smoker - uses snuff; Marital Status - Single; Alcohol Use: Rarely; Drug Use: NADYA, [name] ([id number]) No History; Caffeine Use: Daily; Financial Concerns: No; Food, Clothing or Shelter Needs: No; Support System Lacking: No; Transportation Concerns: No Physician Affirmation I have reviewed and agree with the above information. Electronic Signature(s) Signed: 03/20/2019 1:33:12 PM By: Worthy Keeler PA-C Signed: 03/20/2019 2:09:28 PM By: Montey Hora Entered By: Worthy Keeler on 03/20/2019 10:19:16 [name], [name]  ([id number]) -------------------------------------------------------------------------------- SuperBill Details Patient Name: [name]. Date of Service: 03/20/2019 Medical Record Number: [id number] Patient Account Number: [id number] Date of Birth/Sex: 03/29/1956 (63 y.o. F) Treating RN: Montey Hora Primary Care Provider: Tandy Gaw Other Clinician: Referring Provider: Tandy Gaw Treating Provider/Extender: STONE III, HOYT Weeks in Treatment: 4 Diagnosis Coding ICD-10 Codes Code Description I89.0 Lymphedema, not elsewhere classified L97.822 Non-pressure chronic ulcer of other part of left lower leg with fat layer exposed L97.812 Non-pressure chronic ulcer of other part of right lower leg with fat layer exposed Facility Procedures CPT4 Code: [id number] Description: (Facility Use Only) [id number] - [facility] COMPRS LWR LT LEG Modifier: Quantity: 1 Physician Procedures CPT4 Code Description: [id number] - WC PHYS LEVEL 4 - EST PT ICD-10 Diagnosis Description I89.0 Lymphedema, not elsewhere classified L97.822 Non-pressure chronic ulcer of other part of left lower leg wit L97.812 Non-pressure chronic ulcer of other  part of right lower leg wi Modifier: h fat layer expose th fat layer expos Quantity: 1 d ed Electronic Signature(s) Signed: 03/20/2019 1:33:12 PM By: Worthy Keeler PA-C Signed: 03/20/2019 2:09:28 PM By: Montey Hora Previous Signature: 03/20/2019 10:20:48 AM Version By: Worthy Keeler PA-C Entered By: Montey Hora on 03/20/2019 10:27:17

## 2019-03-23 ENCOUNTER — Other Ambulatory Visit: Payer: Self-pay

## 2019-03-23 DIAGNOSIS — L97812 Non-pressure chronic ulcer of other part of right lower leg with fat layer exposed: Secondary | ICD-10-CM | POA: Diagnosis not present

## 2019-03-24 NOTE — Progress Notes (Signed)
MAURYA, CHARLSON ([id number]) Visit Report for 03/23/2019 Arrival Information Details Patient Name: [name], [name]. Date of Service: 03/23/2019 1:45 PM Medical Record Number: [id number] Patient Account Number: [id number] Date of Birth/Sex: 1956/03/18 (63 y.o. F) Treating RN: Cornell Barman Primary Care Kaelan Emami: Tandy Gaw Other Clinician: Referring Sheilia Reznick: Tandy Gaw Treating Jireh Vinas/Extender: Melburn Hake, HOYT Weeks in Treatment: 4 Visit Information History Since Last Visit Added or deleted any medications: No Patient Arrived: Cane Any new allergies or adverse reactions: No Arrival Time: 14:15 Had a fall or experienced change in No Accompanied By: self activities of daily living that may affect Transfer Assistance: None risk of falls: Patient Identification Verified: Yes Signs or symptoms of abuse/neglect since last visito No Secondary Verification Process Completed: Yes Hospitalized since last visit: No Patient Requires Transmission-Based Precautions: No Implantable device outside of the clinic excluding No Patient Has Alerts: No cellular tissue based products placed in the center since last visit: Has Dressing in Place as Prescribed: No Has Compression in Place as Prescribed: Yes Pain Present Now: No Electronic Signature(s) Signed: 03/24/2019 1:29:00 PM By: Gretta Cool, BSN, RN, CWS, Kim RN, BSN Entered By: Gretta Cool, BSN, RN, CWS, Kim on 03/23/2019 14:29:54 [name] ([id number]) -------------------------------------------------------------------------------- Clinic Level of Care Assessment Details Patient Name: [name]. Date of Service: 03/23/2019 1:45 PM Medical Record Number: [id number] Patient Account Number: [id number] Date of Birth/Sex: 22-Mar-1956 (63 y.o. F) Treating RN: Cornell Barman Primary Care Nyeema Want: Tandy Gaw Other Clinician: Referring Kymberly Blomberg: Tandy Gaw Treating Collene Massimino/Extender: Melburn Hake, HOYT Weeks in Treatment: 4 Clinic Level of Care  Assessment Items TOOL 4 Quantity Score []  - Use when only an EandM is performed on FOLLOW-UP visit 0 ASSESSMENTS - Nursing Assessment / Reassessment X - Reassessment of Co-morbidities (includes updates in patient status) 1 10 X- 1 5 Reassessment of Adherence to Treatment Plan ASSESSMENTS - Wound and Skin Assessment / Reassessment X - Simple Wound Assessment / Reassessment - one wound 1 5 []  - 0 Complex Wound Assessment / Reassessment - multiple wounds []  - 0 Dermatologic / Skin Assessment (not related to wound area) ASSESSMENTS - Focused Assessment []  - Circumferential Edema Measurements - multi extremities 0 []  - 0 Nutritional Assessment / Counseling / Intervention []  - 0 Lower Extremity Assessment (monofilament, tuning fork, pulses) []  - 0 Peripheral Arterial Disease Assessment (using hand held doppler) ASSESSMENTS - Ostomy and/or Continence Assessment and Care []  - Incontinence Assessment and Management 0 []  - 0 Ostomy Care Assessment and Management (repouching, etc.) PROCESS - Coordination of Care X - Simple Patient / Family Education for ongoing care 1 15 []  - 0 Complex (extensive) Patient / Family Education for ongoing care []  - 0 Staff obtains Programmer, systems, Records, Test Results / Process Orders []  - 0 Staff telephones HHA, Nursing Homes / Clarify orders / etc []  - 0 Routine Transfer to another Facility (non-emergent condition) []  - 0 Routine Hospital Admission (non-emergent condition) []  - 0 New Admissions / Biomedical engineer / Ordering NPWT, Apligraf, etc. []  - 0 Emergency Hospital Admission (emergent condition) X- 1 10 Simple Discharge Coordination SHANICA, CREGAN. ([id number]) []  - 0 Complex (extensive) Discharge Coordination PROCESS - Special Needs []  - Pediatric / Minor Patient Management 0 []  - 0 Isolation Patient Management []  - 0 Hearing / Language / Visual special needs []  - 0 Assessment of Community assistance (transportation, D/C planning,  etc.) []  - 0 Additional assistance / Altered mentation []  - 0 Support Surface(s) Assessment (bed, cushion, seat, etc.) INTERVENTIONS - Wound Cleansing / Measurement  X - Simple Wound Cleansing - one wound 1 5 []  - 0 Complex Wound Cleansing - multiple wounds X- 1 5 Wound Imaging (photographs - any number of wounds) []  - 0 Wound Tracing (instead of photographs) X- 1 5 Simple Wound Measurement - one wound []  - 0 Complex Wound Measurement - multiple wounds INTERVENTIONS - Wound Dressings []  - Small Wound Dressing one or multiple wounds 0 []  - 0 Medium Wound Dressing one or multiple wounds X- 1 20 Large Wound Dressing one or multiple wounds []  - 0 Application of Medications - topical []  - 0 Application of Medications - injection INTERVENTIONS - Miscellaneous []  - External ear exam 0 []  - 0 Specimen Collection (cultures, biopsies, blood, body fluids, etc.) []  - 0 Specimen(s) / Culture(s) sent or taken to Lab for analysis []  - 0 Patient Transfer (multiple staff / Civil Service fast streamer / Similar devices) []  - 0 Simple Staple / Suture removal (25 or less) []  - 0 Complex Staple / Suture removal (26 or more) []  - 0 Hypo / Hyperglycemic Management (close monitor of Blood Glucose) []  - 0 Ankle / Brachial Index (ABI) - do not check if billed separately X- 1 5 Vital Signs Albor, Milanna C. ([id number]) Has the patient been seen at the hospital within the last three years: Yes Total Score: 85 Level Of Care: New/Established - Level 3 Electronic Signature(s) Signed: 03/24/2019 1:29:00 PM By: Gretta Cool, BSN, RN, CWS, Kim RN, BSN Entered By: Gretta Cool, BSN, RN, CWS, Kim on 03/23/2019 14:31:58 [name] ([id number]) -------------------------------------------------------------------------------- Encounter Discharge Information Details Patient Name: [name]. Date of Service: 03/23/2019 1:45 PM Medical Record Number: [id number] Patient Account Number: [id number] Date of Birth/Sex:  1956/02/12 (63 y.o. F) Treating RN: Cornell Barman Primary Care Chidera Thivierge: Tandy Gaw Other Clinician: Referring Basel Defalco: Tandy Gaw Treating Teghan Philbin/Extender: Melburn Hake, HOYT Weeks in Treatment: 4 Encounter Discharge Information Items Discharge Condition: Stable Ambulatory Status: Cane Discharge Destination: Home Transportation: Private Auto Accompanied By: self Schedule Follow-up Appointment: Yes Clinical Summary of Care: Electronic Signature(s) Signed: 03/24/2019 1:29:00 PM By: Gretta Cool, BSN, RN, CWS, Kim RN, BSN Entered By: Gretta Cool, BSN, RN, CWS, Kim on 03/23/2019 14:31:32 [name] ([id number]) -------------------------------------------------------------------------------- Wound Assessment Details Patient Name: CHARLSIE, VENTURI C. Date of Service: 03/23/2019 1:45 PM Medical Record Number: [id number] Patient Account Number: [id number] Date of Birth/Sex: [birth year] (63 y.o. F) Treating RN: Cornell Barman Primary Care Massie Mees: Tandy Gaw Other Clinician: Referring Roran Wegner: Tandy Gaw Treating Malillany Kazlauskas/Extender: STONE III, HOYT Weeks in Treatment: 4 Wound Status Wound Number: 3 Primary Lymphedema Etiology: Wound Location: Left Lower Leg - Medial Wound Open Wounding Event: Blister Status: Date Acquired: 10/08/2018 Comorbid Cataracts, Lymphedema, Asthma, Sleep Weeks Of Treatment: 4 History: Apnea, Tuberculosis, Hypertension, Gout, Clustered Wound: No Osteoarthritis Photos Photo Uploaded By: Gretta Cool, BSN, RN, CWS, Kim on 03/23/2019 15:28:12 Wound Measurements Length: (cm) 40 Width: (cm) 18 Depth: (cm) 0.1 Area: (cm) 565.487 Volume: (cm) 56.549 % Reduction in Area: -1804.8% % Reduction in Volume: -1804.6% Epithelialization: None Tunneling: No Undermining: No Wound Description Full Thickness Without Exposed Support Foul Odo Classification: Structures Slough/F Wound Margin: Thickened Exudate Large Amount: Exudate Type: Serous Exudate Color: amber r After  Cleansing: No ibrino Yes Wound Bed Granulation Amount: Medium (34-66%) Exposed Structure Granulation Quality: Red Fascia Exposed: No Necrotic Amount: Medium (34-66%) Fat Layer (Subcutaneous Tissue) Exposed: Yes Necrotic Quality: Adherent Slough Tendon Exposed: No Muscle Exposed: No Joint Exposed: No Bone Exposed: No KIESHIA, GALLIS ([id number]) Electronic Signature(s) Signed: 03/24/2019 1:29:00 PM By: Gretta Cool, BSN, RN, CWS,  Maudie Mercury RN, BSN Entered By: Gretta Cool, BSN, RN, CWS, Kim on 03/23/2019 14:30:48

## 2019-03-24 NOTE — Progress Notes (Signed)
JAIA, HIGNIGHT ([id number]) Visit Report for 03/23/2019 Physician Orders Details Patient Name: MARKEISHA, BEAUBIEN. Date of Service: 03/23/2019 1:45 PM Medical Record Number: [id number] Patient Account Number: [id number] Date of Birth/Sex: [birth year] (63 y.o. F) Treating RN: Cornell Barman Primary Care Provider: Tandy Gaw Other Clinician: Referring Provider: Tandy Gaw Treating Provider/Extender: STONE III, HOYT Weeks in Treatment: 4 Verbal / Phone Orders: No Diagnosis Coding Primary Wound Dressing Wound #3 Left,Medial Lower Leg o Other: - Drawtex roll Secondary Dressing Wound #3 Left,Medial Lower Leg o Other - Zetuvit Dressing Change Frequency Wound #3 Left,Medial Lower Leg o Other: - As needed Follow-up Appointments Wound #3 Left,Medial Lower Leg o Return Appointment in 1 week. Wound #4 Right,Medial Lower Leg o Return Appointment in 1 week. Edema Control o Compression Pump: Use compression pump on left lower extremity for 60 minutes, twice daily. o Compression Pump: Use compression pump on right lower extremity for 60 minutes, twice daily. Electronic Signature(s) Signed: 03/23/2019 5:45:18 PM By: Worthy Keeler PA-C Signed: 03/24/2019 1:29:00 PM By: Gretta Cool, BSN, RN, CWS, Kim RN, BSN Entered By: Gretta Cool, BSN, RN, CWS, Kim on 03/23/2019 14:33:45

## 2019-03-25 ENCOUNTER — Emergency Department: Payer: Medicaid Other

## 2019-03-25 ENCOUNTER — Emergency Department
Admission: EM | Admit: 2019-03-25 | Discharge: 2019-03-25 | Disposition: A | Discharge status: Home / Self Care | Payer: Medicaid Other | Attending: Emergency Medicine | Admitting: Emergency Medicine

## 2019-03-25 ENCOUNTER — Ambulatory Visit: Payer: Medicaid Other | Admitting: Occupational Therapy

## 2019-03-25 ENCOUNTER — Encounter: Payer: Self-pay | Admitting: Emergency Medicine

## 2019-03-25 ENCOUNTER — Other Ambulatory Visit: Payer: Self-pay

## 2019-03-25 DIAGNOSIS — I890 Lymphedema, not elsewhere classified: Secondary | ICD-10-CM | POA: Diagnosis not present

## 2019-03-25 DIAGNOSIS — J45909 Unspecified asthma, uncomplicated: Secondary | ICD-10-CM | POA: Insufficient documentation

## 2019-03-25 DIAGNOSIS — R600 Localized edema: Secondary | ICD-10-CM | POA: Diagnosis not present

## 2019-03-25 DIAGNOSIS — I10 Essential (primary) hypertension: Secondary | ICD-10-CM | POA: Insufficient documentation

## 2019-03-25 DIAGNOSIS — Z79899 Other long term (current) drug therapy: Secondary | ICD-10-CM | POA: Insufficient documentation

## 2019-03-25 DIAGNOSIS — L03116 Cellulitis of left lower limb: Secondary | ICD-10-CM | POA: Insufficient documentation

## 2019-03-25 DIAGNOSIS — L97812 Non-pressure chronic ulcer of other part of right lower leg with fat layer exposed: Secondary | ICD-10-CM | POA: Diagnosis not present

## 2019-03-25 DIAGNOSIS — L0390 Cellulitis, unspecified: Secondary | ICD-10-CM

## 2019-03-25 DIAGNOSIS — F17220 Nicotine dependence, chewing tobacco, uncomplicated: Secondary | ICD-10-CM | POA: Diagnosis not present

## 2019-03-25 DIAGNOSIS — M546 Pain in thoracic spine: Secondary | ICD-10-CM | POA: Insufficient documentation

## 2019-03-25 LAB — URINALYSIS, COMPLETE (UACMP) WITH MICROSCOPIC
Bacteria, UA: NONE SEEN
Bilirubin Urine: NEGATIVE
Glucose, UA: NEGATIVE mg/dL
Hgb urine dipstick: NEGATIVE
Ketones, ur: NEGATIVE mg/dL
Leukocytes,Ua: NEGATIVE
Nitrite: NEGATIVE
Protein, ur: NEGATIVE mg/dL
Specific Gravity, Urine: 1.028 (ref 1.005–1.030)
pH: 5 (ref 5.0–8.0)

## 2019-03-25 LAB — COMPREHENSIVE METABOLIC PANEL
ALT: 17 U/L (ref 0–44)
AST: 22 U/L (ref 15–41)
Albumin: 3.3 g/dL — ABNORMAL LOW (ref 3.5–5.0)
Alkaline Phosphatase: 97 U/L (ref 38–126)
Anion gap: 9 (ref 5–15)
BUN: 17 mg/dL (ref 8–23)
CO2: 26 mmol/L (ref 22–32)
Calcium: 8.7 mg/dL — ABNORMAL LOW (ref 8.9–10.3)
Chloride: 105 mmol/L (ref 98–111)
Creatinine, Ser: 1.03 mg/dL — ABNORMAL HIGH (ref 0.44–1.00)
GFR calc Af Amer: >60 mL/min (ref >60)
GFR calc non Af Amer: 58 mL/min — ABNORMAL LOW (ref >60)
Glucose, Bld: 99 mg/dL (ref 70–99)
Potassium: 4.7 mmol/L (ref 3.5–5.1)
Sodium: 140 mmol/L (ref 135–145)
Total Bilirubin: 0.6 mg/dL (ref 0.3–1.2)
Total Protein: 8 g/dL (ref 6.5–8.1)

## 2019-03-25 LAB — CBC WITH DIFFERENTIAL/PLATELET
Abs Immature Granulocytes: 0.05 10*3/uL (ref 0.00–0.07)
Basophils Absolute: 0 10*3/uL (ref 0.0–0.1)
Basophils Relative: 0 %
Eosinophils Absolute: 0 10*3/uL (ref 0.0–0.5)
Eosinophils Relative: 0 %
HCT: 36.5 % (ref 36.0–46.0)
Hemoglobin: 11.1 g/dL — ABNORMAL LOW (ref 12.0–15.0)
Immature Granulocytes: 1 %
Lymphocytes Relative: 28 %
Lymphs Abs: 1.8 10*3/uL (ref 0.7–4.0)
MCH: 25.9 pg — ABNORMAL LOW (ref 26.0–34.0)
MCHC: 30.4 g/dL (ref 30.0–36.0)
MCV: 85.3 fL (ref 80.0–100.0)
Monocytes Absolute: 0.8 10*3/uL (ref 0.1–1.0)
Monocytes Relative: 13 %
Neutro Abs: 3.7 10*3/uL (ref 1.7–7.7)
Neutrophils Relative %: 58 %
Platelets: 212 10*3/uL (ref 150–400)
RBC: 4.28 MIL/uL (ref 3.87–5.11)
RDW: 14.5 % (ref 11.5–15.5)
WBC: 6.3 10*3/uL (ref 4.0–10.5)
nRBC: 0 % (ref 0.0–0.2)

## 2019-03-25 LAB — TROPONIN I (HIGH SENSITIVITY)
Troponin I (High Sensitivity): 4 ng/L (ref <18)
Troponin I (High Sensitivity): 4 ng/L (ref <18)

## 2019-03-25 LAB — BRAIN NATRIURETIC PEPTIDE: B Natriuretic Peptide: 76 pg/mL (ref 0.0–100.0)

## 2019-03-25 MED ORDER — MORPHINE SULFATE (PF) 4 MG/ML IV SOLN
4.0000 mg | Freq: Once | INTRAVENOUS | Status: AC
  Administered 2019-03-25: 4 mg via INTRAVENOUS
  Filled 2019-03-25: qty 1

## 2019-03-25 MED ORDER — LIDOCAINE 5 % EX PTCH: 1.0000 | MEDICATED_PATCH | Freq: Two times a day (BID) | CUTANEOUS | 0 refills | Status: DC

## 2019-03-25 MED ORDER — IOHEXOL 350 MG/ML SOLN
100.0000 mL | Freq: Once | INTRAVENOUS | Status: AC | PRN
  Administered 2019-03-25: 100 mL via INTRAVENOUS

## 2019-03-25 MED ORDER — LIDOCAINE 5 % EX PTCH
1.0000 | MEDICATED_PATCH | Freq: Once | CUTANEOUS | Status: DC
  Administered 2019-03-25: 1 via TRANSDERMAL
  Filled 2019-03-25: qty 1

## 2019-03-25 NOTE — ED Notes (Signed)
Assisted pt to toilet with ED tech Elmyra Ricks.

## 2019-03-25 NOTE — ED Provider Notes (Signed)
[facility] Emergency Department Provider Note   ____________________________________________   First MD Initiated Contact with Patient 03/25/19 1206     (approximate)  I have reviewed the triage vital signs and the nursing notes.   HISTORY  Chief Complaint Back Pain    HPI [name] is a 63 y.o. female with past medical history of hypertension and lower extremity lymphedema who presents to the ED complaining of back pain.  Patient reports she has had approximately 1 week of tearing pain in her right upper back.  She states it seems to radiate around from the side under her right breast to right anterior chest.  It is exacerbated by deep breath and with movement, but she denies any fevers, cough, or shortness of breath.  She has never had similar symptoms before.  She initially thought it was related to a muscle strain as she has had 2 lift her swollen legs up off the bed herself, but became concerned when it continued to worsen.  She took ibuprofen earlier today without relief.  She has chronic swelling to her lower extremities, receives regular wound care and was recently started on an antibiotic for a wound to her left lower extremity.  She states the wound seems to be improving, had follow-up in the wound care clinic earlier today that was reassuring.        Past Medical History:  Diagnosis Date  . Arthritis   . Asthma   . Enlarged heart   . Hypertension   . Lymphedema   . Sleep apnea     Patient Active Problem List   Diagnosis Date Noted  . Cellulitis of left lower extremity 11/03/2018  . Sepsis ([place]) 04/17/2018  . Lymphedema 08/04/2016  . OSA (obstructive sleep apnea) 08/04/2016  . HTN (hypertension) 08/04/2016  . Asthma 08/04/2016  . Cellulitis 01/24/2015  . Cellulitis of right leg 01/23/2015    Past Surgical History:  Procedure Laterality Date  . COLONOSCOPY WITH PROPOFOL N/A 03/12/2018   Procedure: COLONOSCOPY WITH PROPOFOL;   Surgeon: Manya Silvas, MD;  Location: [facility] ENDOSCOPY;  Service: Endoscopy;  Laterality: N/A;  . NO PAST SURGERIES      Prior to Admission medications   Medication Sig Start Date End Date Taking? Authorizing Provider  ciprofloxacin (CIPRO) 500 MG tablet Take 500 mg by mouth 2 (two) times daily. 03/24/19 04/07/19 Yes [provider]  acetaminophen (TYLENOL) 500 MG tablet Take 1,000-1,500 mg by mouth every 6 (six) hours as needed for mild pain.     [provider]  albuterol (PROVENTIL HFA;VENTOLIN HFA) 108 (90 BASE) MCG/ACT inhaler Inhale 2 puffs into the lungs every 6 (six) hours as needed for wheezing or shortness of breath.    [provider]  amLODipine (NORVASC) 5 MG tablet Take 5 mg by mouth daily.     [provider]  atenolol (TENORMIN) 50 MG tablet Take 50 mg by mouth daily.    [provider]  cetirizine (ZYRTEC) 10 MG tablet Take 10 mg by mouth daily as needed for allergies.    [provider]  lidocaine (LIDODERM) 5 % Place 1 patch onto the skin every 12 (twelve) hours. Remove & Discard patch within 12 hours or as directed by MD 03/25/19 03/24/20  Blake Divine, MD  NYSTATIN powder Apply 1 application topically 2 (two) times daily. 03/23/19   [provider]  sulfamethoxazole-trimethoprim (BACTRIM DS) 800-160 MG tablet Take 1 tablet by mouth 2 (two) times daily. 11/05/18  Bettey Costa, MD    Allergies Ace inhibitors and Ibuprofen  Family History  Problem Relation Age of Onset  . Thyroid disease Other   . Breast cancer Maternal Aunt     Social History Social History   Tobacco Use  . Smoking status: Former Research scientist (life sciences)  . Smokeless tobacco: Current User    Types: Snuff  Substance Use Topics  . Alcohol use: No  . Drug use: No    Review of Systems  Constitutional: No fever/chills Eyes: No visual changes. ENT: No sore throat. Cardiovascular: Denies chest pain. Respiratory: Denies shortness of breath.  Gastrointestinal: No abdominal pain.  No nausea, no vomiting.  No diarrhea.  No constipation. Genitourinary: Negative for dysuria. Musculoskeletal: Positive for back pain. Skin: Negative for rash. Neurological: Negative for headaches, focal weakness or numbness.  ____________________________________________   PHYSICAL EXAM:  VITAL SIGNS: ED Triage Vitals [03/25/19 0923]  Enc Vitals Group     BP (!) 159/100     Pulse Rate 71     Resp 20     Temp 98.7 F (37.1 C)     Temp Source Oral     SpO2 94 %     Weight (!) 393 lb (178.3 kg)     Height 5\' 3"  (1.6 m)     Head Circumference      Peak Flow      Pain Score 9     Pain Loc      Pain Edu?      Excl. in [place]?     Constitutional: Alert and oriented. Eyes: Conjunctivae are normal. Head: Atraumatic. Nose: No congestion/rhinnorhea. Mouth/Throat: Mucous membranes are moist. Neck: Normal ROM Cardiovascular: Normal rate, regular rhythm. Grossly normal heart sounds. Respiratory: Normal respiratory effort.  No retractions. Lungs CTAB. Gastrointestinal: Soft and nontender. No distention. Genitourinary: deferred Musculoskeletal: Woody edema to bilateral lower extremities consistent with lymphedema, dressed wound to left lower extremity with mild erythema and warmth, minimal tenderness.  Tenderness to palpation over right lateral thoracic back with no midline tenderness. Neurologic:  Normal speech and language. No gross focal neurologic deficits are appreciated. Skin:  Skin is warm, dry and intact. No rash noted. Psychiatric: Mood and affect are normal. Speech and behavior are normal.  ____________________________________________   LABS (all labs ordered are listed, but only abnormal results are displayed)  Labs Reviewed  COMPREHENSIVE METABOLIC PANEL - Abnormal; Notable for the following components:      Result Value   Creatinine, Ser 1.03 (*)    Calcium 8.7 (*)    Albumin 3.3 (*)    GFR calc non Af Amer 58 (*)    All other  components within normal limits  URINALYSIS, COMPLETE (UACMP) WITH MICROSCOPIC - Abnormal; Notable for the following components:   Color, Urine YELLOW (*)    APPearance CLEAR (*)    All other components within normal limits  CBC WITH DIFFERENTIAL/PLATELET - Abnormal; Notable for the following components:   Hemoglobin 11.1 (*)    MCH 25.9 (*)    All other components within normal limits  BRAIN NATRIURETIC PEPTIDE  TROPONIN I (HIGH SENSITIVITY)  TROPONIN I (HIGH SENSITIVITY)   ____________________________________________  EKG  ED ECG REPORT I, Blake Divine, the attending physician, personally viewed and interpreted this ECG.   Date: 03/25/2019  EKG Time: 9:28  Rate: 73  Rhythm: NSR  Axis: Normal  Intervals:none  ST&T Change: None    PROCEDURES  Procedure(s) performed (including Critical Care):  Procedures   ____________________________________________   INITIAL IMPRESSION /  ASSESSMENT AND PLAN / ED COURSE       63 year old female with history of lymphedema to bilateral lower extremities presents to the ED with 1 week of worsening right lateral thoracic back pain that she describes as tearing.  While symptoms appear most consistent with musculoskeletal strain, will need to rule out dissection given tearing pain that is pleuritic.  Lower suspicion for ACS, but will screen 2 sets of troponin.  Patient with heart score of less than 4.  EKG without acute ischemic changes.  Wound to her left lower extremity appears to be well managed with close follow-up at wound care clinic, recently improving on antibiotics.  CTA of chest and abdomen negative for acute process, no evidence of dissection, lungs clear.  2 sets of troponin negative, suspect musculoskeletal origin of symptoms.  Will prescribe Lidoderm patches, counseled patient to follow-up with PCP.  Counseled to return to the ED for new or worsening symptoms, patient agrees with plan.       ____________________________________________   FINAL CLINICAL IMPRESSION(S) / ED DIAGNOSES  Final diagnoses:  Acute right-sided thoracic back pain  Lymphedema  Cellulitis, unspecified cellulitis site     ED Discharge Orders         Ordered    lidocaine (LIDODERM) 5 %  Every 12 hours     03/25/19 1656           Note:  This document was prepared using Dragon voice recognition software and may include unintentional dictation errors.   Blake Divine, MD 03/25/19 2144

## 2019-03-25 NOTE — ED Triage Notes (Signed)
Pt reports upper back pain that started last week. Pt reports it feels like something is coming apart. Pt reports slept wrong and had to bend a wrap her own leg so is not sure if that is what caused the pain or not. Pt reports "feels like her back is breaking". Pt describes the pain as stabbing in nature.

## 2019-03-25 NOTE — ED Notes (Signed)
Patient transported to CT 

## 2019-03-26 LAB — AEROBIC/ANAEROBIC CULTURE W GRAM STAIN (SURGICAL/DEEP WOUND): Gram Stain: NONE SEEN

## 2019-03-26 LAB — AEROBIC/ANAEROBIC CULTURE (SURGICAL/DEEP WOUND)

## 2019-03-26 NOTE — Progress Notes (Addendum)
[name], [name] ([id number]) Visit Report for 03/25/2019 Arrival Information Details Patient Name: [name], [name]. Date of Service: 03/25/2019 8:15 AM Medical Record Number: [id number] Patient Account Number: [id number] Date of Birth/Sex: 1956/07/06 (63 y.o. F) Treating RN: Montey Hora Primary Care [name]: Tandy Gaw Other Clinician: Referring [name]: Tandy Gaw Treating Darey Hershberger/Extender: Tito Dine in Treatment: 4 Visit Information History Since Last Visit Added or deleted any medications: No Patient Arrived: Cane Any new allergies or adverse reactions: No Arrival Time: 08:20 Had a fall or experienced change in No Accompanied By: self activities of daily living that may affect Transfer Assistance: None risk of falls: Patient Identification Verified: Yes Signs or symptoms of abuse/neglect since last visito No Secondary Verification Process Completed: Yes Hospitalized since last visit: No Patient Requires Transmission-Based Precautions: No Implantable device outside of the clinic excluding No Patient Has Alerts: No cellular tissue based products placed in the center since last visit: Has Dressing in Place as Prescribed: Yes Has Compression in Place as Prescribed: Yes Pain Present Now: Yes Electronic Signature(s) Signed: 03/25/2019 9:07:16 AM By: Montey Hora Entered By: Montey Hora on 03/25/2019 09:07:15 [name] ([id number]) -------------------------------------------------------------------------------- Clinic Level of Care Assessment Details Patient Name: [name]. Date of Service: 03/25/2019 8:15 AM Medical Record Number: [id number] Patient Account Number: [id number] Date of Birth/Sex: Jan 21, 1956 (63 y.o. F) Treating RN: Montey Hora Primary Care [name]: Tandy Gaw Other Clinician: Referring [name]: Tandy Gaw Treating [name]/Extender: Tito Dine in Treatment: 4 Clinic Level of Care Assessment  Items TOOL 4 Quantity Score []  - Use when only an EandM is performed on FOLLOW-UP visit 0 ASSESSMENTS - Nursing Assessment / Reassessment X - Reassessment of Co-morbidities (includes updates in patient status) 1 10 X- 1 5 Reassessment of Adherence to Treatment Plan ASSESSMENTS - Wound and Skin Assessment / Reassessment []  - Simple Wound Assessment / Reassessment - one wound 0 X- 2 5 Complex Wound Assessment / Reassessment - multiple wounds []  - 0 Dermatologic / Skin Assessment (not related to wound area) ASSESSMENTS - Focused Assessment []  - Circumferential Edema Measurements - multi extremities 0 []  - 0 Nutritional Assessment / Counseling / Intervention []  - 0 Lower Extremity Assessment (monofilament, tuning fork, pulses) []  - 0 Peripheral Arterial Disease Assessment (using hand held doppler) ASSESSMENTS - Ostomy and/or Continence Assessment and Care []  - Incontinence Assessment and Management 0 []  - 0 Ostomy Care Assessment and Management (repouching, etc.) PROCESS - Coordination of Care X - Simple Patient / Family Education for ongoing care 1 15 []  - 0 Complex (extensive) Patient / Family Education for ongoing care X- 1 10 Staff obtains Programmer, systems, Records, Test Results / Process Orders []  - 0 Staff telephones HHA, Nursing Homes / Clarify orders / etc []  - 0 Routine Transfer to another Facility (non-emergent condition) []  - 0 Routine Hospital Admission (non-emergent condition) []  - 0 New Admissions / Biomedical engineer / Ordering NPWT, Apligraf, etc. []  - 0 Emergency Hospital Admission (emergent condition) X- 1 10 Simple Discharge Coordination [name], GUTH. ([id number]) []  - 0 Complex (extensive) Discharge Coordination PROCESS - Special Needs []  - Pediatric / Minor Patient Management 0 []  - 0 Isolation Patient Management []  - 0 Hearing / Language / Visual special needs []  - 0 Assessment of Community assistance (transportation, D/C planning, etc.) []  -  0 Additional assistance / Altered mentation []  - 0 Support Surface(s) Assessment (bed, cushion, seat, etc.) INTERVENTIONS - Wound Cleansing / Measurement []  - Simple Wound Cleansing - one wound 0  X- 2 5 Complex Wound Cleansing - multiple wounds []  - 0 Wound Imaging (photographs - any number of wounds) []  - 0 Wound Tracing (instead of photographs) []  - 0 Simple Wound Measurement - one wound X- 2 5 Complex Wound Measurement - multiple wounds INTERVENTIONS - Wound Dressings []  - Small Wound Dressing one or multiple wounds 0 []  - 0 Medium Wound Dressing one or multiple wounds X- 2 20 Large Wound Dressing one or multiple wounds X- 1 5 Application of Medications - topical []  - 0 Application of Medications - injection INTERVENTIONS - Miscellaneous []  - External ear exam 0 []  - 0 Specimen Collection (cultures, biopsies, blood, body fluids, etc.) []  - 0 Specimen(s) / Culture(s) sent or taken to Lab for analysis []  - 0 Patient Transfer (multiple staff / Civil Service fast streamer / Similar devices) []  - 0 Simple Staple / Suture removal (25 or less) []  - 0 Complex Staple / Suture removal (26 or more) []  - 0 Hypo / Hyperglycemic Management (close monitor of Blood Glucose) []  - 0 Ankle / Brachial Index (ABI) - do not check if billed separately []  - 0 Vital Signs [name], [name] ([id number]) Has the patient been seen at the hospital within the last three years: Yes Total Score: 125 Level Of Care: New/Established - Level 4 Electronic Signature(s) Unsigned Entered By: Montey Hora on 03/25/2019 09:16:37 Signature(s): Date(s): [name] ([id number]) -------------------------------------------------------------------------------- Encounter Discharge Information Details Patient Name: [name], [name]. Date of Service: 03/25/2019 8:15 AM Medical Record Number: [id number] Patient Account Number: [id number] Date of Birth/Sex: Apr 25, 1956 (63 y.o. F) Treating RN: Montey Hora Primary  Care [name]: Tandy Gaw Other Clinician: Referring [name]: Tandy Gaw Treating [name]/Extender: Tito Dine in Treatment: 4 Encounter Discharge Information Items Discharge Condition: Stable Ambulatory Status: Cane Discharge Destination: Emergency Room Telephoned: No Orders Sent: No Transportation: Private Auto Accompanied By: self Schedule Follow-up Appointment: Yes Clinical Summary of Care: Notes patient reports hurting her back and is going to the ER for treatment of her back Electronic Signature(s) Signed: 03/25/2019 9:16:01 AM By: Montey Hora Entered By: Montey Hora on 03/25/2019 09:16:01 [name], [name] ([id number]) -------------------------------------------------------------------------------- Pain Assessment Details Patient Name: [name]. Date of Service: 03/25/2019 8:15 AM Medical Record Number: [id number] Patient Account Number: [id number] Date of Birth/Sex: 1956/02/14 (63 y.o. F) Treating RN: Montey Hora Primary Care Jayro Mcmath: Tandy Gaw Other Clinician: Referring Asiyah Pineau: Tandy Gaw Treating Zakyra Kukuk/Extender: Ricard Dillon Weeks in Treatment: 4 Active Problems Location of Pain Severity and Description of Pain Patient Has Paino Yes Site Locations Pain Location: Generalized Pain With Dressing Change: No Pain Management and Medication Current Pain Management: Notes patient reports hurting her back and states that she is going to the ED after this visit Electronic Signature(s) Signed: 03/25/2019 9:11:23 AM By: Montey Hora Entered By: Montey Hora on 03/25/2019 09:11:23 [place]hin, [name] ([id number]) -------------------------------------------------------------------------------- Wound Assessment Details Patient Name: Elizabeth Landau C. Date of Service: 03/25/2019 8:15 AM Medical Record Number: [id number] Patient Account Number: [id number] Date of Birth/Sex: 1956/05/16 (63 y.o. F) Treating RN: Montey Hora Primary Care Eureka Valdes: Tandy Gaw Other Clinician: Referring Gabbi Whetstone: Tandy Gaw Treating Edilson Vital/Extender: Ricard Dillon Weeks in Treatment: 4 Wound Status Wound Number: 3 Primary Lymphedema Etiology: Wound Location: Left Lower Leg - Medial Wound Open Wounding Event: Blister Status: Date Acquired: 10/08/2018 Comorbid Cataracts, Lymphedema, Asthma, Sleep Weeks Of Treatment: 4 History: Apnea, Tuberculosis, Hypertension, Gout, Clustered Wound: No Osteoarthritis Wound Measurements Length: (cm) 40 Width: (cm) 18 Depth: (cm) 0.1 Area: (cm) 565.487  Volume: (cm) 56.549 % Reduction in Area: -1804.8% % Reduction in Volume: -1804.6% Epithelialization: Medium (34-66%) Tunneling: No Undermining: No Wound Description Full Thickness Without Exposed Support Classification: Structures Wound Margin: Thickened Exudate Large Amount: Exudate Type: Serous Exudate Color: amber Foul Odor After Cleansing: Yes Due to Product Use: No Slough/Fibrino Yes Wound Bed Granulation Amount: Medium (34-66%) Exposed Structure Granulation Quality: Red Fascia Exposed: No Necrotic Amount: Medium (34-66%) Fat Layer (Subcutaneous Tissue) Exposed: Yes Necrotic Quality: Adherent Slough Tendon Exposed: No Muscle Exposed: No Joint Exposed: No Bone Exposed: No Treatment Notes Wound #3 (Left, Medial Lower Leg) Notes Nystatin powder, abd pads, zetuvit, kerlix and ace wrap on the left nystatin powder, kerramax, abd pads secured with tape on the right Electronic Signature(s) Signed: 03/25/2019 9:08:01 AM By: Montey Hora Entered By: Montey Hora on 03/25/2019 McGrath, [name] ([id number]) [name], [name] ([id number]) -------------------------------------------------------------------------------- Wound Assessment Details Patient Name: Elizabeth Landau C. Date of Service: 03/25/2019 8:15 AM Medical Record Number: [id number] Patient Account Number: [id number] Date of  Birth/Sex: 1955/11/18 (63 y.o. F) Treating RN: Montey Hora Primary Care Bonna Steury: Tandy Gaw Other Clinician: Referring Elishua Radford: Tandy Gaw Treating Anjoli Diemer/Extender: Ricard Dillon Weeks in Treatment: 4 Wound Status Wound Number: 4 Primary Lymphedema Etiology: Wound Location: Right Lower Leg - Medial Wound Open Wounding Event: Gradually Appeared Status: Date Acquired: 02/02/2019 Comorbid Cataracts, Lymphedema, Asthma, Sleep Weeks Of Treatment: 4 History: Apnea, Tuberculosis, Hypertension, Gout, Clustered Wound: No Osteoarthritis Wound Measurements Length: (cm) 0.1 Width: (cm) 0.1 Depth: (cm) 0.1 Area: (cm) 0.008 Volume: (cm) 0.001 % Reduction in Area: 100% % Reduction in Volume: 100% Epithelialization: Medium (34-66%) Tunneling: No Undermining: No Wound Description Full Thickness Without Exposed Support Classification: Structures Wound Margin: Indistinct, nonvisible Exudate Large Amount: Exudate Type: Serous Exudate Color: amber Foul Odor After Cleansing: No Slough/Fibrino Yes Wound Bed Granulation Amount: Medium (34-66%) Exposed Structure Granulation Quality: Pink Fascia Exposed: No Necrotic Amount: Medium (34-66%) Fat Layer (Subcutaneous Tissue) Exposed: Yes Necrotic Quality: Adherent Slough Tendon Exposed: No Muscle Exposed: No Joint Exposed: No Bone Exposed: No Treatment Notes Wound #4 (Right, Medial Lower Leg) Notes Nystatin powder, abd pads, zetuvit, kerlix and ace wrap on the left nystatin powder, kerramax, abd pads secured with tape on the right Electronic Signature(s) Signed: 03/25/2019 9:08:26 AM By: Montey Hora Entered By: Montey Hora on 03/25/2019 [id number]

## 2019-03-27 ENCOUNTER — Other Ambulatory Visit: Payer: Self-pay

## 2019-03-27 ENCOUNTER — Encounter: Payer: Medicaid Other | Admitting: Physician Assistant

## 2019-03-27 DIAGNOSIS — L97812 Non-pressure chronic ulcer of other part of right lower leg with fat layer exposed: Secondary | ICD-10-CM | POA: Diagnosis not present

## 2019-03-27 NOTE — Progress Notes (Addendum)
SAKSHI, GOODBAR ([id number]) Visit Report for 03/27/2019 Chief Complaint Document Details Patient Name: [name], [name]. Date of Service: 03/27/2019 9:30 AM Medical Record Number: [id number] Patient Account Number: [id number] Date of Birth/Sex: [birth year] (63 y.o. F) Treating RN: Montey Hora Primary Care Provider: Tandy Gaw Other Clinician: Referring Provider: Tandy Gaw Treating Provider/Extender: Melburn Hake, Rehema Muffley Weeks in Treatment: 5 Information Obtained from: Patient Chief Complaint Left LE Ulcer Electronic Signature(s) Signed: 03/27/2019 9:32:39 AM By: Worthy Keeler PA-C Entered By: Worthy Keeler on 03/27/2019 09:32:38 Mccuistion, [name] ([id number]) -------------------------------------------------------------------------------- HPI Details Patient Name: [name]. Date of Service: 03/27/2019 9:30 AM Medical Record Number: [id number] Patient Account Number: [id number] Date of Birth/Sex: 28-Jul-1955 (63 y.o. F) Treating RN: Montey Hora Primary Care Provider: Tandy Gaw Other Clinician: Referring Provider: Tandy Gaw Treating Provider/Extender: STONE III, Breauna Mazzeo Weeks in Treatment: 5 History of Present Illness HPI Description: The patient is a 63 year old female with history of hypertension and a long-standing history of bilateral lower extremity lymphedema (first presented on 4/2) . She has had open ulcers in the past which have always responded to compression therapy. She had briefly been to a lymphedema clinic in the past which helped her at the time. this time around she stopped treatment of her lymphedema pumps approximately 2 weeks ago because of some pain in the knees and then noticed the right leg getting worse. She was seen by her PCP who put her on clindamycin 4 times a day 2 days ago. The patient has seen [facility] and Dr. Delana Meyer had seen her last year where a vascular study including venous and arterial duplex studies were within normal limits. he  had recommended compression stockings and lymphedema pumps and the patient has been using this in about 2 weeks ago. She is known to be diabetic but in the past few time she's gone to her primary care doctor her hemoglobin A1c has been normal. 02/11/2015 - after her last visit she took my advice and went to the ER regarding the progressive cellulitis of her right lower extremity and she was admitted between July 17 and 22nd. She received IV antibiotics and then was sent home on a course of steroid-induced and oral antibiotics. She has improved much since then. 02/17/2015 -- she has been doing fine and the weeping of her legs has remarkably gone down. She has no fresh issues. READMISSION 01/15/18 This patient was given this clinic before most recently in 2016 seen by Dr. Con Memos. She has massive bilateral lymphedema and over the last 2 months this had weeping edema out of the left leg. She has compression pumps but her compliance with these has been minimal. She has [facility] they've been using TCA/ABDs/kerlix under an Ace wrap.she has had recent problems with cellulitis. She was apparently seen in the ER and 12/23/17 and given clindamycin. She was then followed by her primary doctor and given doxycycline and Keflex. The pain seems to have settled down. In April 2018 the patient had arterial studies done at [facility]. This showed triphasic waveforms throughout the right leg and mostly triphasic waveforms on the left except for monophasic at the posterior tibial artery distally. She was not felt to have evidence of right lower extremity arterial stenosis or significant problems on the left side. 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 She also had a right lower extremity venous Doppler in January 2018 this was limited by the patient's body habitus and lymphedema. Most of the  proximal veins were not visualized The patient presents with an area of  denuded skin on the anterior medial part of the left calf. There is weeping edema fluid here. 01/22/18; the patient has somewhat better edema control using her compression pumps twice a day and as a result she has much better epithelialization on the left anterior calf area. Only a small open area remains. 01/29/18; the patient has been compliant with her compression pumps. Both the areas on her calf that healed. The remaining area on the left anterior leg is fully epithelialized Readmission: 02/20/2019 upon evaluation today patient presents for reevaluation due to issues that she is having with the bilateral lower extremities. She actually has wounds open on both legs. On the right she has an area in the crease of her leg on the right around the knee region which is actually draining quite a bit and actually has some fungal type appearance to it. She has been on nystatin powder that seems to have helped to some degree. In regard to the left lower extremity this is actually in the [place]. ([id number]) lower portion of her leg closer to the ankle and again is continuing to drain as well unfortunately. There does not appear to be any signs of active infection at this time which is good news. No fevers, chills, nausea, vomiting, or diarrhea. She tells me that since she was seen last year she is actually been doing quite well for the most part with regard to her lower extremities. Unfortunately she now is experiencing a little bit more drainage at this time. She is concerned about getting this under control so that it does not get significantly worse. 02/27/2019 on evaluation today patient appears to be doing somewhat better in regard to her bilateral lower extremity wounds. She has been tolerating the dressing changes without complication. Fortunately there is no signs of active infection at this point. No fevers, chills, nausea, vomiting, or diarrhea. She did get her dressing supplies which is  excellent news she was extremely excited to get these. She also got paperwork from [employer] for their financial assistance program where they may be able to help her out in the future if needed with supplies at discounted prices. 03/06/2019 on evaluation today patient appears to be doing a little worse with regard to both areas of weeping on her bilateral lower extremities. This is around the right medial knee and just above the left ankle. With that being said she is unfortunately not doing as well as I would like to see. I feel like she may need to potentially go see someone at the lymphedema clinic as the wraps that she needs or even beyond what we can do here at the wound care center. She really does not have wounds she just has open areas of weeping that are causing some difficulty for her. Subsequently because of this and the moisture I am concerned about the potential for infection I am going to likely give her a prophylactic antibiotic today, Keflex, just to be on the safe side. Nonetheless again there is no obvious signs of active infection at this time. 03/13/2019 on evaluation today patient appears to be doing well with regard to her bilateral lower extremities where she has been weeping compared to even last week's evaluation. I see some areas of new skin growth which is excellent and overall I am very pleased with how things seem to be progressing. No fevers, chills, nausea, vomiting, or diarrhea. 03/20/2019 on evaluation  today patient unfortunately is continuing to have issues with significant edema of the left lower extremity. Her right side seems to be doing much better. Unfortunately her left side is showing increased weeping of the lower portion of her leg. This is quite unfortunate obviously we were hoping to get her into the lymphedema clinic they really do not seem to when I see her how if she is draining. Despite the fact this is really not wound related but more lymphedema  weeping related. Nonetheless I do not know that this can be helpful for her to even go for that appointment since again I am not sure there is much that they would actually do at this point. We may need to try a 4 layer compression wrap as best we can on her leg. She is on the Augmentin currently although I am still concerned about whether or not there could be potentially something going on infection wise I would obtain a culture though I understand is not the best being that is a surface culture I just 1 to make sure I do not seem to be missing anything. 03/27/2019 on evaluation today patient appears to be doing much better in regard to the left lower extremity compared to last week. Last week she had tremendous weeping which I think was subsequent to infection now she seems to be doing much better and very pleased. This is not completely healed but there is a lot of new skin growth and it has dried out quite a bit. Overall I think that we are doing well with how things are moving along at this time. No fevers, chills, nausea, vomiting, or diarrhea. Electronic Signature(s) Signed: 03/27/2019 9:57:46 AM By: Worthy Keeler PA-C Entered By: Worthy Keeler on 03/27/2019 09:57:46 Schoen, [name] ([id number]) -------------------------------------------------------------------------------- Physical Exam Details Patient Name: ARDICE, MADRID C. Date of Service: 03/27/2019 9:30 AM Medical Record Number: [id number] Patient Account Number: [id number] Date of Birth/Sex: 1955-11-12 (63 y.o. F) Treating RN: Montey Hora Primary Care Provider: Tandy Gaw Other Clinician: Referring Provider: Tandy Gaw Treating Provider/Extender: STONE III, Sangeeta Youse Weeks in Treatment: 5 Constitutional Well-nourished and well-hydrated in no acute distress. Respiratory normal breathing without difficulty. clear to auscultation bilaterally. Cardiovascular regular rate and rhythm with normal S1, S2. Psychiatric this  patient is able to make decisions and demonstrates good insight into disease process. Alert and Oriented x 3. pleasant and cooperative. Notes Patient has been taking the Cipro without complication that seems to be helping to dry out her leg which is excellent news overall very pleased with how things appear today. There is no evidence of systemic infection which is also good news. She is using pads at home to keep the drainage under control that seems to be doing very well for her. She ran out of supplies and unfortunately her insurance does not cover any additional supplies for her. Electronic Signature(s) Signed: 03/27/2019 9:58:17 AM By: Worthy Keeler PA-C Entered By: Worthy Keeler on 03/27/2019 09:58:16 Reyez, [name] ([id number]) -------------------------------------------------------------------------------- Physician Orders Details Patient Name: [name] Date of Service: 03/27/2019 9:30 AM Medical Record Number: [id number] Patient Account Number: [id number] Date of Birth/Sex: 29-Sep-1955 (63 y.o. F) Treating RN: Montey Hora Primary Care Provider: Tandy Gaw Other Clinician: Referring Provider: Tandy Gaw Treating Provider/Extender: STONE III, Early Ord Weeks in Treatment: 5 Verbal / Phone Orders: No Diagnosis Coding ICD-10 Coding Code Description I89.0 Lymphedema, not elsewhere classified L97.822 Non-pressure chronic ulcer of other part of left lower leg with fat layer  exposed L97.812 Non-pressure chronic ulcer of other part of right lower leg with fat layer exposed Wound Cleansing Wound #3 Left,Medial Lower Leg o Dial antibacterial soap, wash wounds, rinse and pat dry prior to dressing wounds o May Shower, gently pat wound dry prior to applying new dressing. Wound #4 Right,Medial Lower Leg o Dial antibacterial soap, wash wounds, rinse and pat dry prior to dressing wounds o May Shower, gently pat wound dry prior to applying new dressing. Primary Wound  Dressing Wound #3 Left,Medial Lower Leg o ABD Pad o XtraSorb - or Zetuvit Wound #4 Right,Medial Lower Leg o ABD Pad o Other: - KerraMax Secondary Dressing Wound #3 Left,Medial Lower Leg o Conform/Kerlix Wound #4 Right,Medial Lower Leg o Conform/Kerlix Dressing Change Frequency Wound #3 Left,Medial Lower Leg o Change Dressing Monday, Wednesday, Friday Wound #4 Right,Medial Lower Leg o Change Dressing Monday, Wednesday, Friday Follow-up Appointments o Return Appointment in 1 week. o Nurse Visit as needed - Monday and Wednesday Edema Control JAZARA, RAYGOR. ([id number]) Wound #3 Left,Medial Lower Leg o Other: - ACE Wrap or 3rd layer of 4 layer wrap Wound #4 Right,Medial Lower Leg o Other: - ACE Wrap or 3rd layer of 4 layer wrap Electronic Signature(s) Signed: 03/27/2019 4:27:31 PM By: Montey Hora Signed: 03/27/2019 4:44:05 PM By: Worthy Keeler PA-C Entered By: Montey Hora on 03/27/2019 09:59:09 Mattioli, [name] ([id number]) -------------------------------------------------------------------------------- Problem List Details Patient Name: [name] Date of Service: 03/27/2019 9:30 AM Medical Record Number: [id number] Patient Account Number: [id number] Date of Birth/Sex: 25-May-1956 (63 y.o. F) Treating RN: Montey Hora Primary Care Provider: Tandy Gaw Other Clinician: Referring Provider: Tandy Gaw Treating Provider/Extender: STONE III, Lucianne Smestad Weeks in Treatment: 5 Active Problems ICD-10 Evaluated Encounter Code Description Active Date Today Diagnosis I89.0 Lymphedema, not elsewhere classified 02/20/2019 No Yes L97.822 Non-pressure chronic ulcer of other part of left lower leg with 02/20/2019 No Yes fat layer exposed L97.812 Non-pressure chronic ulcer of other part of right lower leg 02/20/2019 No Yes with fat layer exposed Inactive Problems Resolved Problems Electronic Signature(s) Signed: 03/27/2019 9:32:31 AM By: Worthy Keeler PA-C Entered By: Worthy Keeler on 03/27/2019 09:32:31 Mcghie, [name] ([id number]) -------------------------------------------------------------------------------- Progress Note Details Patient Name: [name]. Date of Service: 03/27/2019 9:30 AM Medical Record Number: [id number] Patient Account Number: [id number] Date of Birth/Sex: [birth year] (63 y.o. F) Treating RN: Montey Hora Primary Care Provider: Tandy Gaw Other Clinician: Referring Provider: Tandy Gaw Treating Provider/Extender: STONE III, Leondra Cullin Weeks in Treatment: 5 Subjective Chief Complaint Information obtained from Patient Left LE Ulcer History of Present Illness (HPI) The patient is a 63 year old female with history of hypertension and a long-standing history of bilateral lower extremity lymphedema (first presented on 4/2) . She has had open ulcers in the past which have always responded to compression therapy. She had briefly been to a lymphedema clinic in the past which helped her at the time. this time around she stopped treatment of her lymphedema pumps approximately 2 weeks ago because of some pain in the knees and then noticed the right leg getting worse. She was seen by her PCP who put her on clindamycin 4 times a day 2 days ago. The patient has seen [facility] and Dr. Delana Meyer had seen her last year where a vascular study including venous and arterial duplex studies were within normal limits. he had recommended compression stockings and lymphedema pumps and the patient has been using this in about 2 weeks ago. She is known to be diabetic but in  the past few time she's gone to her primary care doctor her hemoglobin A1c has been normal. 02/11/2015 - after her last visit she took my advice and went to the ER regarding the progressive cellulitis of her right lower extremity and she was admitted between July 17 and 22nd. She received IV antibiotics and then was sent home on a course of steroid-induced  and oral antibiotics. She has improved much since then. 02/17/2015 -- she has been doing fine and the weeping of her legs has remarkably gone down. She has no fresh issues. READMISSION 01/15/18 This patient was given this clinic before most recently in 2016 seen by Dr. Con Memos. She has massive bilateral lymphedema and over the last 2 months this had weeping edema out of the left leg. She has compression pumps but her compliance with these has been minimal. She has [facility] they've been using TCA/ABDs/kerlix under an Ace wrap.she has had recent problems with cellulitis. She was apparently seen in the ER and 12/23/17 and given clindamycin. She was then followed by her primary doctor and given doxycycline and Keflex. The pain seems to have settled down. In April 2018 the patient had arterial studies done at [facility]. This showed triphasic waveforms throughout the right leg and mostly triphasic waveforms on the left except for monophasic at the posterior tibial artery distally. She was not felt to have evidence of right lower extremity arterial stenosis or significant problems on the left side. 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 She also had a right lower extremity venous Doppler in January 2018 this was limited by the patient's body habitus and lymphedema. Most of the proximal veins were not visualized The patient presents with an area of denuded skin on the anterior medial part of the left calf. There is weeping edema fluid here. 01/22/18; the patient has somewhat better edema control using her compression pumps twice a day and as a result she has much better epithelialization on the left anterior calf area. Only a small open area remains. 01/29/18; the patient has been compliant with her compression pumps. Both the areas on her calf that healed. The remaining area on the left anterior leg is fully epithelialized VENELOPE, MANGANIELLO.  ([id number]) Readmission: 02/20/2019 upon evaluation today patient presents for reevaluation due to issues that she is having with the bilateral lower extremities. She actually has wounds open on both legs. On the right she has an area in the crease of her leg on the right around the knee region which is actually draining quite a bit and actually has some fungal type appearance to it. She has been on nystatin powder that seems to have helped to some degree. In regard to the left lower extremity this is actually in the lower portion of her leg closer to the ankle and again is continuing to drain as well unfortunately. There does not appear to be any signs of active infection at this time which is good news. No fevers, chills, nausea, vomiting, or diarrhea. She tells me that since she was seen last year she is actually been doing quite well for the most part with regard to her lower extremities. Unfortunately she now is experiencing a little bit more drainage at this time. She is concerned about getting this under control so that it does not get significantly worse. 02/27/2019 on evaluation today patient appears to be doing somewhat better in regard to her bilateral lower extremity wounds. She has been  tolerating the dressing changes without complication. Fortunately there is no signs of active infection at this point. No fevers, chills, nausea, vomiting, or diarrhea. She did get her dressing supplies which is excellent news she was extremely excited to get these. She also got paperwork from [employer] for their financial assistance program where they may be able to help her out in the future if needed with supplies at discounted prices. 03/06/2019 on evaluation today patient appears to be doing a little worse with regard to both areas of weeping on her bilateral lower extremities. This is around the right medial knee and just above the left ankle. With that being said she is unfortunately not doing as well  as I would like to see. I feel like she may need to potentially go see someone at the lymphedema clinic as the wraps that she needs or even beyond what we can do here at the wound care center. She really does not have wounds she just has open areas of weeping that are causing some difficulty for her. Subsequently because of this and the moisture I am concerned about the potential for infection I am going to likely give her a prophylactic antibiotic today, Keflex, just to be on the safe side. Nonetheless again there is no obvious signs of active infection at this time. 03/13/2019 on evaluation today patient appears to be doing well with regard to her bilateral lower extremities where she has been weeping compared to even last week's evaluation. I see some areas of new skin growth which is excellent and overall I am very pleased with how things seem to be progressing. No fevers, chills, nausea, vomiting, or diarrhea. 03/20/2019 on evaluation today patient unfortunately is continuing to have issues with significant edema of the left lower extremity. Her right side seems to be doing much better. Unfortunately her left side is showing increased weeping of the lower portion of her leg. This is quite unfortunate obviously we were hoping to get her into the lymphedema clinic they really do not seem to when I see her how if she is draining. Despite the fact this is really not wound related but more lymphedema weeping related. Nonetheless I do not know that this can be helpful for her to even go for that appointment since again I am not sure there is much that they would actually do at this point. We may need to try a 4 layer compression wrap as best we can on her leg. She is on the Augmentin currently although I am still concerned about whether or not there could be potentially something going on infection wise I would obtain a culture though I understand is not the best being that is a surface culture I just 1  to make sure I do not seem to be missing anything. 03/27/2019 on evaluation today patient appears to be doing much better in regard to the left lower extremity compared to last week. Last week she had tremendous weeping which I think was subsequent to infection now she seems to be doing much better and very pleased. This is not completely healed but there is a lot of new skin growth and it has dried out quite a bit. Overall I think that we are doing well with how things are moving along at this time. No fevers, chills, nausea, vomiting, or diarrhea. Patient History Information obtained from Patient. Family History Cancer - Father, Diabetes - Father, Heart Disease - Mother, Hypertension - Mother,Father, Seizures - Mother, Stroke -  Mother, Thyroid Problems - Mother,Siblings, No family history of Hereditary Spherocytosis, Kidney Disease, Lung Disease, Tuberculosis. Social History Former smoker - uses snuff, Marital Status - Single, Alcohol Use - Rarely, Drug Use - No History, Caffeine Use - Daily. 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 Denies history of Aspiration, Chronic Obstructive Pulmonary Disease (COPD), Pneumothorax Cardiovascular Patient has history of Hypertension Denies history of Angina, Arrhythmia, Congestive Heart Failure, Coronary Artery Disease, Deep Vein Thrombosis, Hypotension, Myocardial Infarction, Peripheral Arterial Disease, Peripheral Venous Disease, Phlebitis, Vasculitis Gastrointestinal Denies history of Cirrhosis , Colitis,  Crohn s, Hepatitis A, Hepatitis B, Hepatitis C Endocrine Denies history of Type I Diabetes, Type II Diabetes Genitourinary Denies history of End Stage Renal Disease Immunological Denies history of Lupus Erythematosus, Raynaud s, Scleroderma Integumentary (Skin) Denies history of History of Burn, History of pressure wounds Musculoskeletal Patient has history of Gout, Osteoarthritis Denies history of Rheumatoid Arthritis, Osteomyelitis Neurologic Denies history of Dementia, Neuropathy, Quadriplegia, Paraplegia, Seizure Disorder Oncologic Denies history of Received Chemotherapy, Received Radiation Psychiatric Denies history of Anorexia/bulimia, Confinement Anxiety Review of Systems (ROS) Constitutional Symptoms (General Health) Denies complaints or symptoms of Fatigue, Fever, Chills, Marked Weight Change. Respiratory Denies complaints or symptoms of Chronic or frequent coughs, Shortness of Breath. Cardiovascular Complains or has symptoms of LE edema. Denies complaints or symptoms of Chest pain. Psychiatric Denies complaints or symptoms of Anxiety, Claustrophobia. Objective Constitutional Well-nourished and well-hydrated in no acute distress. Vitals Time Taken: 9:30 AM, Height: 62 in, Weight: 390 lbs, BMI: 71.3, Temperature: 98.7 F, Pulse: 73 bpm, Respiratory Rivadeneira, Tamilyn C. ([id number]) Rate: 20 breaths/min, Blood Pressure: 138/62 mmHg. Respiratory normal breathing without difficulty. clear to auscultation bilaterally. Cardiovascular regular rate and rhythm with normal S1, S2. Psychiatric this patient is able to make decisions and demonstrates good insight into disease process. Alert and Oriented x 3. pleasant and cooperative. General Notes: Patient has been taking the Cipro without complication that seems to be helping to dry out her leg which is excellent news overall very pleased with how things appear today. There is no evidence of systemic infection which is also good  news. She is using pads at home to keep the drainage under control that seems to be doing very well for her. She ran out of supplies and unfortunately her insurance does not cover any additional supplies for her. Integumentary (Hair, Skin) Wound #3 status is Open. Original cause of wound was Blister. The wound is located on the Left,Medial Lower Leg. The wound measures 29cm length x 16cm width x 0.1cm depth; 364.425cm^2 area and 36.442cm^3 volume. There is Fat Layer (Subcutaneous Tissue) Exposed exposed. There is no tunneling or undermining noted. There is a large amount of serous drainage noted. Foul odor after cleansing was noted. The wound margin is thickened. There is medium (34-66%) red granulation within the wound bed. There is a medium (34-66%) amount of necrotic tissue within the wound bed including Adherent Slough. Wound #4 status is Open. Original cause of wound was Gradually Appeared. The wound is located on the Right,Medial Lower Leg. The wound measures 0.1cm length x 0.1cm width x 0.1cm depth; 0.008cm^2 area and 0.001cm^3 volume. There is Fat Layer (Subcutaneous Tissue) Exposed  exposed. There is no tunneling or undermining noted. There is a large amount of serous drainage noted. The wound margin is indistinct and nonvisible. There is medium (34-66%) pink granulation within the wound bed. There is a medium (34-66%) amount of necrotic tissue within the wound bed including Adherent Slough. Assessment Active Problems ICD-10 Lymphedema, not elsewhere classified Non-pressure chronic ulcer of other part of left lower leg with fat layer exposed Non-pressure chronic ulcer of other part of right lower leg with fat layer exposed Plan Wound Cleansing: Wound #3 Left,Medial Lower Leg: Dial antibacterial soap, wash wounds, rinse and pat dry prior to dressing wounds May Shower, gently pat wound dry prior to applying new dressing. Wound #4 Right,Medial Lower Leg: Dial antibacterial soap, wash  wounds, rinse and pat dry prior to dressing wounds RUDENE, ANSELL ([id number]) May Shower, gently pat wound dry prior to applying new dressing. Skin Barriers/Peri-Wound Care: Wound #3 Left,Medial Lower Leg: Antifungal powder-Nystatin Wound #4 Right,Medial Lower Leg: Antifungal powder-Nystatin Primary Wound Dressing: Wound #3 Left,Medial Lower Leg: ABD Pad XtraSorb - or Zetuvit Wound #4 Right,Medial Lower Leg: ABD Pad Other: - KerraMax Secondary Dressing: Wound #3 Left,Medial Lower Leg: Conform/Kerlix Wound #4 Right,Medial Lower Leg: Conform/Kerlix Dressing Change Frequency: Wound #3 Left,Medial Lower Leg: Change Dressing Monday, Wednesday, Friday Wound #4 Right,Medial Lower Leg: Change Dressing Monday, Wednesday, Friday Follow-up Appointments: Return Appointment in 1 week. Nurse Visit as needed - Monday and Wednesday Edema Control: Wound #3 Left,Medial Lower Leg: Other: - ACE Wrap or 3rd layer of 4 layer wrap Wound #4 Right,Medial Lower Leg: Other: - ACE Wrap or 3rd layer of 4 layer wrap 1. I would recommend currently she continue taking the Cipro as this seems to be doing excellent and I think that it will get the drainage under better control and already has I am expecting this to completely resolve which would be excellent. 2. I am also going to suggest currently that the patient continue to elevate her legs as much as possible this obviously can help with some of the edema as well. She did have an appointment with lymphedema clinic although we will hold off on that until we get her drainage under control as they really cannot manage her with issues as such. We will see patient back for reevaluation in 1 week here in the clinic. If anything worsens or changes patient will contact our office for additional recommendations. Electronic Signature(s) Signed: 03/27/2019 10:00:26 AM By: Worthy Keeler PA-C Entered By: Worthy Keeler on 03/27/2019 10:00:26 [name]  ([id number]) -------------------------------------------------------------------------------- ROS/PFSH Details Patient Name: [name] Date of Service: 03/27/2019 9:30 AM Medical Record Number: [id number] Patient Account Number: [id number] Date of Birth/Sex: May 14, 1956 (63 y.o. 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 Negative for: Aspiration; Chronic Obstructive Pulmonary Disease (COPD); Pneumothorax Cardiovascular Complaints and Symptoms: Positive for: LE edema Negative for: Chest pain Medical History: Positive for: Hypertension Negative for: Angina; Arrhythmia; Congestive Heart Failure; Coronary Artery Disease; Deep Vein Thrombosis; Hypotension; Myocardial Infarction; Peripheral Arterial Disease; Peripheral Venous Disease; Phlebitis; Vasculitis Psychiatric Complaints and Symptoms: Negative for: Anxiety; Claustrophobia Medical History: Negative for:  Anorexia/bulimia; Confinement Anxiety Eyes Medical History: Positive for: Cataracts Negative for: Glaucoma; Optic Neuritis Ear/Nose/Mouth/Throat Medical History: Negative for: Chronic sinus problems/congestion; Middle ear problems Hematologic/Lymphatic Medical History: Positive for: Lymphedema ROZELIA, SCHLOSSER. ([id number]) Negative for: Anemia; Hemophilia; Human Immunodeficiency Virus; Sickle Cell Disease Gastrointestinal Medical History: Negative for: Cirrhosis ; Colitis; Crohnos; Hepatitis A; Hepatitis B;  Hepatitis C Endocrine Medical History: Negative for: Type I Diabetes; Type II Diabetes Genitourinary Medical History: Negative for: End Stage Renal Disease Immunological Medical History: Negative for: Lupus Erythematosus; Raynaudos; Scleroderma Integumentary (Skin) Medical History: Negative for: History of Burn; History of pressure wounds Musculoskeletal Medical History: Positive for: Gout; Osteoarthritis Negative for: Rheumatoid Arthritis; Osteomyelitis Neurologic Medical History: Negative for: Dementia; Neuropathy; Quadriplegia; Paraplegia; Seizure Disorder Oncologic Medical History: Negative for: Received Chemotherapy; Received Radiation HBO Extended History Items Eyes: Cataracts Immunizations Pneumococcal Vaccine: Received Pneumococcal Vaccination: Yes Implantable Devices No devices added Family and Social History Cancer: Yes - Father; Diabetes: Yes - Father; Heart Disease: Yes - Mother; Hereditary Spherocytosis: No; Hypertension: Yes - Mother,Father; Kidney Disease: No; Lung Disease: No; Seizures: Yes - Mother; Stroke: Yes - Mother; Thyroid Problems: Yes - Mother,Siblings; Tuberculosis: No; Former smoker - uses snuff; Marital Status - Single; Alcohol Use: Rarely; Drug Use: NYSSA, PISCITELLI C. ([id number]) No History; Caffeine Use: Daily; Financial Concerns: No; Food, Clothing or Shelter Needs: No; Support System Lacking: No; Transportation Concerns: No Physician Affirmation I have reviewed and agree with the above information. Electronic Signature(s) Signed: 03/27/2019 4:27:31 PM By: Montey Hora Signed: 03/27/2019 4:44:05 PM By: Worthy Keeler PA-C Entered By: Worthy Keeler on 03/27/2019 09:58:01 Amacher, [name] ([id number]) -------------------------------------------------------------------------------- SuperBill Details Patient Name: [name]. Date of Service: 03/27/2019 Medical Record Number: [id number] Patient Account Number: [id number] Date of  Birth/Sex: 1955-09-20 (63 y.o. F) Treating RN: Montey Hora Primary Care Provider: Tandy Gaw Other Clinician: Referring Provider: Tandy Gaw Treating Provider/Extender: STONE III, Adlyn Fife Weeks in Treatment: 5 Diagnosis Coding ICD-10 Codes Code Description I89.0 Lymphedema, not elsewhere classified L97.822 Non-pressure chronic ulcer of other part of left lower leg with fat layer exposed L97.812 Non-pressure chronic ulcer of other part of right lower leg with fat layer exposed Facility Procedures CPT4 Code: [id number] Description: 99214 - WOUND CARE VISIT-LEV 4 EST PT Modifier: Quantity: 1 Physician Procedures CPT4 Code Description: [id number] - WC PHYS LEVEL 4 - EST PT ICD-10 Diagnosis Description I89.0 Lymphedema, not elsewhere classified L97.822 Non-pressure chronic ulcer of other part of left lower leg wit L97.812 Non-pressure chronic ulcer of other  part of right lower leg wi Modifier: h fat layer expos th fat layer expo Quantity: 1 ed sed Electronic Signature(s) Signed: 03/27/2019 4:27:31 PM By: Montey Hora Signed: 03/27/2019 4:44:05 PM By: Worthy Keeler PA-C Previous Signature: 03/27/2019 10:00:51 AM Version By: Worthy Keeler PA-C Entered By: Montey Hora on 03/27/2019 10:17:15

## 2019-03-31 NOTE — Progress Notes (Signed)
[name], [name] ([id number]) Visit Report for 03/27/2019 Arrival Information Details Patient Name: [name], [name]. Date of Service: 03/27/2019 9:30 AM Medical Record Number: [id number] Patient Account Number: [id number] Date of Birth/Sex: 10-Dec-1955 (63 y.o. F) Treating RN: Montey Hora Primary Care Gerad Cornelio: Tandy Gaw Other Clinician: Referring Adaliz Dobis: Tandy Gaw Treating Jacquese Hackman/Extender: STONE III, HOYT Weeks in Treatment: 5 Visit Information History Since Last Visit Added or deleted any medications: No Patient Arrived: Cane Any new allergies or adverse reactions: No Arrival Time: 09:30 Had a fall or experienced change in No Accompanied By: self activities of daily living that may affect Transfer Assistance: None risk of falls: Patient Identification Verified: Yes Signs or symptoms of abuse/neglect since last visito No Secondary Verification Process Completed: Yes Hospitalized since last visit: No Patient Requires Transmission-Based Precautions: No Implantable device outside of the clinic excluding No Patient Has Alerts: No cellular tissue based products placed in the center since last visit: Has Dressing in Place as Prescribed: Yes Has Compression in Place as Prescribed: Yes Pain Present Now: No Electronic Signature(s) Signed: 03/31/2019 1:07:49 PM By: Lorine Bears RCP, RRT, CHT Entered By: Lorine Bears on 03/27/2019 09:31:47 [name], [name] ([id number]) -------------------------------------------------------------------------------- Clinic Level of Care Assessment Details Patient Name: [name]. Date of Service: 03/27/2019 9:30 AM Medical Record Number: [id number] Patient Account Number: [id number] Date of Birth/Sex: 1956-03-14 (63 y.o. F) Treating RN: Montey Hora Primary Care Genova Kiner: Tandy Gaw Other Clinician: Referring Maliya Marich: Tandy Gaw Treating Britania Shreeve/Extender: STONE III, HOYT Weeks in Treatment:  5 Clinic Level of Care Assessment Items TOOL 4 Quantity Score []  - Use when only an EandM is performed on FOLLOW-UP visit 0 ASSESSMENTS - Nursing Assessment / Reassessment X - Reassessment of Co-morbidities (includes updates in patient status) 1 10 X- 1 5 Reassessment of Adherence to Treatment Plan ASSESSMENTS - Wound and Skin Assessment / Reassessment []  - Simple Wound Assessment / Reassessment - one wound 0 X- 2 5 Complex Wound Assessment / Reassessment - multiple wounds []  - 0 Dermatologic / Skin Assessment (not related to wound area) ASSESSMENTS - Focused Assessment X - Circumferential Edema Measurements - multi extremities 1 5 []  - 0 Nutritional Assessment / Counseling / Intervention X- 1 5 Lower Extremity Assessment (monofilament, tuning fork, pulses) []  - 0 Peripheral Arterial Disease Assessment (using hand held doppler) ASSESSMENTS - Ostomy and/or Continence Assessment and Care []  - Incontinence Assessment and Management 0 []  - 0 Ostomy Care Assessment and Management (repouching, etc.) PROCESS - Coordination of Care X - Simple Patient / Family Education for ongoing care 1 15 []  - 0 Complex (extensive) Patient / Family Education for ongoing care X- 1 10 Staff obtains Programmer, systems, Records, Test Results / Process Orders []  - 0 Staff telephones HHA, Nursing Homes / Clarify orders / etc []  - 0 Routine Transfer to another Facility (non-emergent condition) []  - 0 Routine Hospital Admission (non-emergent condition) []  - 0 New Admissions / Biomedical engineer / Ordering NPWT, Apligraf, etc. []  - 0 Emergency Hospital Admission (emergent condition) X- 1 10 Simple Discharge Coordination [name], [name]. ([id number]) []  - 0 Complex (extensive) Discharge Coordination PROCESS - Special Needs []  - Pediatric / Minor Patient Management 0 []  - 0 Isolation Patient Management []  - 0 Hearing / Language / Visual special needs []  - 0 Assessment of Community assistance  (transportation, D/C planning, etc.) []  - 0 Additional assistance / Altered mentation []  - 0 Support Surface(s) Assessment (bed, cushion, seat, etc.) INTERVENTIONS - Wound Cleansing / Measurement []  - Simple  Wound Cleansing - one wound 0 X- 2 5 Complex Wound Cleansing - multiple wounds X- 1 5 Wound Imaging (photographs - any number of wounds) []  - 0 Wound Tracing (instead of photographs) []  - 0 Simple Wound Measurement - one wound X- 2 5 Complex Wound Measurement - multiple wounds INTERVENTIONS - Wound Dressings []  - Small Wound Dressing one or multiple wounds 0 []  - 0 Medium Wound Dressing one or multiple wounds X- 2 20 Large Wound Dressing one or multiple wounds []  - 0 Application of Medications - topical []  - 0 Application of Medications - injection INTERVENTIONS - Miscellaneous []  - External ear exam 0 []  - 0 Specimen Collection (cultures, biopsies, blood, body fluids, etc.) []  - 0 Specimen(s) / Culture(s) sent or taken to Lab for analysis []  - 0 Patient Transfer (multiple staff / Civil Service fast streamer / Similar devices) []  - 0 Simple Staple / Suture removal (25 or less) []  - 0 Complex Staple / Suture removal (26 or more) []  - 0 Hypo / Hyperglycemic Management (close monitor of Blood Glucose) []  - 0 Ankle / Brachial Index (ABI) - do not check if billed separately X- 1 5 Vital Signs [name], [name] ([id number]) Has the patient been seen at the hospital within the last three years: Yes Total Score: 140 Level Of Care: New/Established - Level 4 Electronic Signature(s) Signed: 03/27/2019 4:27:31 PM By: Montey Hora Entered By: Montey Hora on 03/27/2019 10:17:03 [name], [name] ([id number]) -------------------------------------------------------------------------------- Lower Extremity Assessment Details Patient Name: [name]. Date of Service: 03/27/2019 9:30 AM Medical Record Number: [id number] Patient Account Number: [id number] Date of Birth/Sex: 08-Feb-1956  (63 y.o. F) Treating RN: Army Melia Primary Care Revecca Nachtigal: Tandy Gaw Other Clinician: Referring Charley Lafrance: Tandy Gaw Treating Tecla Mailloux/Extender: STONE III, HOYT Weeks in Treatment: 5 Edema Assessment Assessed: [Left: No] [Right: No] Edema: [Left: No] [Right: No] Vascular Assessment Pulses: Dorsalis Pedis Palpable: [Left:Yes] [Right:Yes] Electronic Signature(s) Signed: 03/27/2019 3:16:22 PM By: Army Melia Entered By: Army Melia on 03/27/2019 09:36:10 [name], [name] ([id number]) -------------------------------------------------------------------------------- Multi Wound Chart Details Patient Name: [name] Landau C. Date of Service: 03/27/2019 9:30 AM Medical Record Number: [id number] Patient Account Number: [id number] Date of Birth/Sex: 1956/04/17 (63 y.o. F) Treating RN: Montey Hora Primary Care Mayrene Bastarache: Tandy Gaw Other Clinician: Referring Keili Hasten: Tandy Gaw Treating Caralynn Gelber/Extender: STONE III, HOYT Weeks in Treatment: 5 Vital Signs Height(in): 62 Pulse(bpm): 73 Weight(lbs): 390 Blood Pressure(mmHg): 138/62 Body Mass Index(BMI): 71 Temperature(F): 98.7 Respiratory Rate 20 (breaths/min): Photos: [N/A:N/A] Wound Location: Left Lower Leg - Medial Right Lower Leg - Medial N/A Wounding Event: Blister Gradually Appeared N/A Primary Etiology: Lymphedema Lymphedema N/A Comorbid History: Cataracts, Lymphedema, Cataracts, Lymphedema, N/A Asthma, Sleep Apnea, Asthma, Sleep Apnea, Tuberculosis, Hypertension, Tuberculosis, Hypertension, Gout, Osteoarthritis Gout, Osteoarthritis Date Acquired: 10/08/2018 02/02/2019 N/A Weeks of Treatment: 5 5 N/A Wound Status: Open Open N/A Measurements L x W x D 29x16x0.1 0.1x0.1x0.1 N/A (cm) Area (cm) : 364.425 0.008 N/A Volume (cm) : 36.442 0.001 N/A % Reduction in Area: -1127.50% 100.00% N/A % Reduction in Volume: -1127.40% 100.00% N/A Classification: Full Thickness Without Full Thickness Without N/A Exposed  Support Structures Exposed Support Structures Exudate Amount: Large Large N/A Exudate Type: Serous Serous N/A Exudate Color: amber amber N/A Foul Odor After Cleansing: Yes No N/A Odor Anticipated Due to No N/A N/A Product Use: Wound Margin: Thickened Indistinct, nonvisible N/A Granulation Amount: Medium (34-66%) Medium (34-66%) N/A Granulation Quality: Red Pink N/A Necrotic Amount: Medium (34-66%) Medium (34-66%) N/A Exposed Structures: N/A YARIELA, HARSEY C. ([id number]) Fat  Layer (Subcutaneous Fat Layer (Subcutaneous Tissue) Exposed: Yes Tissue) Exposed: Yes Fascia: No Fascia: No Tendon: No Tendon: No Muscle: No Muscle: No Joint: No Joint: No Bone: No Bone: No Epithelialization: Medium (34-66%) Medium (34-66%) N/A Treatment Notes Electronic Signature(s) Signed: 03/27/2019 4:27:31 PM By: Montey Hora Entered By: Montey Hora on 03/27/2019 09:55:25 [name], [name] ([id number]) -------------------------------------------------------------------------------- Early Details Patient Name: [name]. Date of Service: 03/27/2019 9:30 AM Medical Record Number: [id number] Patient Account Number: [id number] Date of Birth/Sex: 1956/06/07 (63 y.o. F) Treating RN: Montey Hora Primary Care Teryl Mcconaghy: Tandy Gaw Other Clinician: Referring Makell Drohan: Tandy Gaw Treating Edith Groleau/Extender: STONE III, HOYT Weeks in Treatment: 5 Active Inactive Abuse / Safety / Falls / Self Care Management Nursing Diagnoses: Potential for falls Goals: Patient will remain injury free related to falls Date Initiated: 02/20/2019 Target Resolution Date: 05/16/2019 Goal Status: Active Interventions: Assess fall risk on admission and as needed Notes: Orientation to the Wound Care Program Nursing Diagnoses: Knowledge deficit related to the wound healing center program Goals: Patient/caregiver will verbalize understanding of the [facility] Date  Initiated: 02/20/2019 Target Resolution Date: 05/16/2019 Goal Status: Active Interventions: Provide education on orientation to the wound center Notes: Venous Leg Ulcer Nursing Diagnoses: Actual venous Insuffiency (use after diagnosis is confirmed) Goals: Patient will maintain optimal edema control Date Initiated: 02/20/2019 Target Resolution Date: 05/16/2019 Goal Status: Active Interventions: Assess peripheral edema status every visit. ETTER, CLAYTOR ([id number]) Compression as ordered Notes: Wound/Skin Impairment Nursing Diagnoses: Impaired tissue integrity Goals: Ulcer/skin breakdown will heal within 14 weeks Date Initiated: 02/20/2019 Target Resolution Date: 05/16/2019 Goal Status: Active Interventions: Assess patient/caregiver ability to obtain necessary supplies Assess patient/caregiver ability to perform ulcer/skin care regimen upon admission and as needed Assess ulceration(s) every visit Notes: Electronic Signature(s) Signed: 03/27/2019 4:27:31 PM By: Montey Hora Entered By: Montey Hora on 03/27/2019 09:54:55 [name], [name] ([id number]) -------------------------------------------------------------------------------- Pain Assessment Details Patient Name: [name]. Date of Service: 03/27/2019 9:30 AM Medical Record Number: [id number] Patient Account Number: [id number] Date of Birth/Sex: July 26, 1955 (63 y.o. F) Treating RN: Montey Hora Primary Care Bodi Palmeri: Tandy Gaw Other Clinician: Referring Giliana Vantil: Tandy Gaw Treating Amirah Goerke/Extender: STONE III, HOYT Weeks in Treatment: 5 Active Problems Location of Pain Severity and Description of Pain Patient Has Paino No Site Locations Pain Management and Medication Current Pain Management: Electronic Signature(s) Signed: 03/27/2019 4:27:31 PM By: Montey Hora Signed: 03/31/2019 1:07:49 PM By: Lorine Bears RCP, RRT, CHT Entered By: Lorine Bears on 03/27/2019  09:32:02 [name] ([id number]) -------------------------------------------------------------------------------- Patient/Caregiver Education Details Patient Name: [name]. Date of Service: 03/27/2019 9:30 AM Medical Record Number: [id number] Patient Account Number: [id number] Date of Birth/Gender: [birth year] (63 y.o. F) Treating RN: Montey Hora Primary Care Physician: Tandy Gaw Other Clinician: Referring Physician: Tandy Gaw Treating Physician/Extender: Melburn Hake, HOYT Weeks in Treatment: 5 Education Assessment Education Provided To: Patient Education Topics Provided Venous: Handouts: Other: continue using pumps Methods: Explain/Verbal Responses: State content correctly Electronic Signature(s) Signed: 03/27/2019 4:27:31 PM By: Montey Hora Entered By: Montey Hora on 03/27/2019 10:17:35 [name], [name] ([id number]) -------------------------------------------------------------------------------- Wound Assessment Details Patient Name: [name] Landau C. Date of Service: 03/27/2019 9:30 AM Medical Record Number: [id number] Patient Account Number: [id number] Date of Birth/Sex: 1955/12/09 (63 y.o. F) Treating RN: Army Melia Primary Care Shayden Bobier: Tandy Gaw Other Clinician: Referring Besnik Febus: Tandy Gaw Treating Amauris Debois/Extender: STONE III, HOYT Weeks in Treatment: 5 Wound Status Wound Number: 3 Primary Lymphedema Etiology: Wound Location: Left Lower Leg - Medial Wound Open  Wounding Event: Blister Status: Date Acquired: 10/08/2018 Comorbid Cataracts, Lymphedema, Asthma, Sleep Weeks Of Treatment: 5 History: Apnea, Tuberculosis, Hypertension, Gout, Clustered Wound: No Osteoarthritis Photos Wound Measurements Length: (cm) 29 Width: (cm) 16 Depth: (cm) 0.1 Area: (cm) 364.425 Volume: (cm) 36.442 % Reduction in Area: -1127.5% % Reduction in Volume: -1127.4% Epithelialization: Medium (34-66%) Tunneling: No Undermining: No Wound  Description Full Thickness Without Exposed Support Foul Odo Classification: Structures Due to P Wound Margin: Thickened Slough/F Exudate Large Amount: Exudate Type: Serous Exudate Color: amber r After Cleansing: Yes roduct Use: No ibrino Yes Wound Bed Granulation Amount: Medium (34-66%) Exposed Structure Granulation Quality: Red Fascia Exposed: No Necrotic Amount: Medium (34-66%) Fat Layer (Subcutaneous Tissue) Exposed: Yes Necrotic Quality: Adherent Slough Tendon Exposed: No Muscle Exposed: No Joint Exposed: No Bone Exposed: No DARILYN, NOLTE ([id number]) Electronic Signature(s) Signed: 03/27/2019 3:16:22 PM By: Army Melia Entered By: Army Melia on 03/27/2019 09:36:40 Dobransky, [name] ([id number]) -------------------------------------------------------------------------------- Wound Assessment Details Patient Name: [name] Landau C. Date of Service: 03/27/2019 9:30 AM Medical Record Number: [id number] Patient Account Number: [id number] Date of Birth/Sex: 26-Aug-1955 (63 y.o. F) Treating RN: Army Melia Primary Care Joia Doyle: Tandy Gaw Other Clinician: Referring Princessa Lesmeister: Tandy Gaw Treating Aava Deland/Extender: STONE III, HOYT Weeks in Treatment: 5 Wound Status Wound Number: 4 Primary Lymphedema Etiology: Wound Location: Right Lower Leg - Medial Wound Open Wounding Event: Gradually Appeared Status: Date Acquired: 02/02/2019 Comorbid Cataracts, Lymphedema, Asthma, Sleep Weeks Of Treatment: 5 History: Apnea, Tuberculosis, Hypertension, Gout, Clustered Wound: No Osteoarthritis Photos Wound Measurements Length: (cm) 0.1 Width: (cm) 0.1 Depth: (cm) 0.1 Area: (cm) 0.008 Volume: (cm) 0.001 % Reduction in Area: 100% % Reduction in Volume: 100% Epithelialization: Medium (34-66%) Tunneling: No Undermining: No Wound Description Full Thickness Without Exposed Support Foul Odo Classification: Structures Slough/F Wound Margin: Indistinct,  nonvisible Exudate Large Amount: Exudate Type: Serous Exudate Color: amber r After Cleansing: No ibrino Yes Wound Bed Granulation Amount: Medium (34-66%) Exposed Structure Granulation Quality: Pink Fascia Exposed: No Necrotic Amount: Medium (34-66%) Fat Layer (Subcutaneous Tissue) Exposed: Yes Necrotic Quality: Adherent Slough Tendon Exposed: No Muscle Exposed: No Joint Exposed: No Bone Exposed: No HEDI, VANDEMAN ([id number]) Electronic Signature(s) Signed: 03/27/2019 3:16:22 PM By: Army Melia Entered By: Army Melia on 03/27/2019 09:37:05 Logie, [name] ([id number]) -------------------------------------------------------------------------------- Vitals Details Patient Name: [name]. Date of Service: 03/27/2019 9:30 AM Medical Record Number: [id number] Patient Account Number: [id number] Date of Birth/Sex: [birth year] (63 y.o. F) Treating RN: Montey Hora Primary Care Briannie Gutierrez: Tandy Gaw Other Clinician: Referring Garnell Begeman: Tandy Gaw Treating Ladora Osterberg/Extender: STONE III, HOYT Weeks in Treatment: 5 Vital Signs Time Taken: 09:30 Temperature (F): 98.7 Height (in): 62 Pulse (bpm): 73 Weight (lbs): 390 Respiratory Rate (breaths/min): 20 Body Mass Index (BMI): 71.3 Blood Pressure (mmHg): 138/62 Reference Range: 80 - 120 mg / dl Electronic Signature(s) Signed: 03/31/2019 1:07:49 PM By: Lorine Bears RCP, RRT, CHT Entered By: Becky Sax, Amado Nash on 03/27/2019 09:32:27

## 2019-04-01 ENCOUNTER — Other Ambulatory Visit: Payer: Self-pay

## 2019-04-01 DIAGNOSIS — L97812 Non-pressure chronic ulcer of other part of right lower leg with fat layer exposed: Secondary | ICD-10-CM | POA: Diagnosis not present

## 2019-04-01 NOTE — Progress Notes (Signed)
[name] ([id number]) Visit Report for 04/01/2019 Arrival Information Details Patient Name: [name], [name]. Date of Service: 04/01/2019 10:00 AM Medical Record Number: [id number] Patient Account Number: [id number] Date of Birth/Sex: 1956-02-21 (63 y.o. F) Treating RN: [name] Primary Care [name]: [name] Other Clinician: Referring [name]: [name] Treating [name]/Extender: [name] in Treatment: 5 Visit Information History Since Last Visit Added or deleted any medications: No Patient Arrived: Ambulatory Any new allergies or adverse reactions: No Arrival Time: 10:08 Had a fall or experienced change in No Accompanied By: self activities of daily living that may affect Transfer Assistance: Manual risk of falls: Patient Requires Transmission-Based No Signs or symptoms of abuse/neglect since last visito No Precautions: Hospitalized since last visit: No Patient Has Alerts: No Has Dressing in Place as Prescribed: Yes Pain Present Now: No Electronic Signature(s) Signed: 04/01/2019 10:47:16 AM By: [name] Entered By: [name] on 04/01/2019 10:08:28 STEPHENSEllamae [name] ([id number]) -------------------------------------------------------------------------------- Clinic Level of Care Assessment Details Patient Name: [name]. Date of Service: 04/01/2019 10:00 AM Medical Record Number: [id number] Patient Account Number: [id number] Date of Birth/Sex: 07-Mar-1956 (63 y.o. F) Treating RN: [name] Primary Care Katrece Roediger: [name] Other Clinician: Referring [name]: [name] Treating [name]/Extender: [name] in Treatment: 5 Clinic Level of Care Assessment Items TOOL 4 Quantity Score []  - Use when only an EandM is performed on FOLLOW-UP visit 0 ASSESSMENTS - Nursing Assessment / Reassessment X - Reassessment of Co-morbidities (includes updates in patient status) 1 10 X- 1 5 Reassessment of Adherence to  Treatment Plan ASSESSMENTS - Wound and Skin Assessment / Reassessment []  - Simple Wound Assessment / Reassessment - one wound 0 X- 2 5 Complex Wound Assessment / Reassessment - multiple wounds []  - 0 Dermatologic / Skin Assessment (not related to wound area) ASSESSMENTS - Focused Assessment []  - Circumferential Edema Measurements - multi extremities 0 []  - 0 Nutritional Assessment / Counseling / Intervention []  - 0 Lower Extremity Assessment (monofilament, tuning fork, pulses) []  - 0 Peripheral Arterial Disease Assessment (using hand held doppler) ASSESSMENTS - Ostomy and/or Continence Assessment and Care []  - Incontinence Assessment and Management 0 []  - 0 Ostomy Care Assessment and Management (repouching, etc.) PROCESS - Coordination of Care X - Simple Patient / Family Education for ongoing care 1 15 []  - 0 Complex (extensive) Patient / Family Education for ongoing care []  - 0 Staff obtains Programmer, systems, Records, Test Results / Process Orders []  - 0 Staff telephones HHA, Nursing Homes / Clarify orders / etc []  - 0 Routine Transfer to another Facility (non-emergent condition) []  - 0 Routine Hospital Admission (non-emergent condition) []  - 0 New Admissions / Biomedical engineer / Ordering NPWT, Apligraf, etc. []  - 0 Emergency Hospital Admission (emergent condition) X- 1 10 Simple Discharge Coordination [name], [name] ([id number]) []  - 0 Complex (extensive) Discharge Coordination PROCESS - Special Needs []  - Pediatric / Minor Patient Management 0 []  - 0 Isolation Patient Management []  - 0 Hearing / Language / Visual special needs []  - 0 Assessment of Community assistance (transportation, D/C planning, etc.) []  - 0 Additional assistance / Altered mentation []  - 0 Support Surface(s) Assessment (bed, cushion, seat, etc.) INTERVENTIONS - Wound Cleansing / Measurement []  - Simple Wound Cleansing - one wound 0 X- 2 5 Complex Wound Cleansing - multiple wounds X- 1  5 Wound Imaging (photographs - any number of wounds) []  - 0 Wound Tracing (instead of photographs) []  - 0 Simple Wound Measurement -  one wound X- 2 5 Complex Wound Measurement - multiple wounds INTERVENTIONS - Wound Dressings []  - Small Wound Dressing one or multiple wounds 0 X- 2 15 Medium Wound Dressing one or multiple wounds []  - 0 Large Wound Dressing one or multiple wounds []  - 0 Application of Medications - topical []  - 0 Application of Medications - injection INTERVENTIONS - Miscellaneous []  - External ear exam 0 []  - 0 Specimen Collection (cultures, biopsies, blood, body fluids, etc.) []  - 0 Specimen(s) / Culture(s) sent or taken to Lab for analysis []  - 0 Patient Transfer (multiple staff / Civil Service fast streamer / Similar devices) []  - 0 Simple Staple / Suture removal (25 or less) []  - 0 Complex Staple / Suture removal (26 or more) []  - 0 Hypo / Hyperglycemic Management (close monitor of Blood Glucose) []  - 0 Ankle / Brachial Index (ABI) - do not check if billed separately []  - 0 Vital Signs [name], [name] ([id number]) Has the patient been seen at the hospital within the last three years: Yes Total Score: 105 Level Of Care: New/Established - Level 3 Electronic Signature(s) Signed: 04/01/2019 10:47:16 AM By: [name] Entered By: [name] on 04/01/2019 10:24:05 [name] ([id number]) -------------------------------------------------------------------------------- Encounter Discharge Information Details Patient Name: [name]. Date of Service: 04/01/2019 10:00 AM Medical Record Number: [id number] Patient Account Number: [id number] Date of Birth/Sex: Jan 31, 1956 (63 y.o. F) Treating RN: [name] Primary Care [name]: [name] Other Clinician: Referring [name]: [name] Treating [name]/Extender: [name] in Treatment: 5 Encounter Discharge Information Items Discharge Condition: Stable Ambulatory Status:  Cane Discharge Destination: Home Transportation: Private Auto Accompanied By: self Schedule Follow-up Appointment: Yes Clinical Summary of Care: Electronic Signature(s) Signed: 04/01/2019 10:47:16 AM By: [name] Entered By: [name] on 04/01/2019 10:23:38 [name], [name] ([id number]) -------------------------------------------------------------------------------- Wound Assessment Details Patient Name: Elizabeth Landau C. Date of Service: 04/01/2019 10:00 AM Medical Record Number: [id number] Patient Account Number: [id number] Date of Birth/Sex: August 20, 1955 (63 y.o. F) Treating RN: [name] Primary Care Cutler Sunday: [name] Other Clinician: Referring Taronda Comacho: [name] Treating Rhetta Cleek/Extender: Ricard Dillon Weeks in Treatment: 5 Wound Status Wound Number: 3 Primary Etiology: Lymphedema Wound Location: Left, Medial Lower Leg Wound Status: Open Wounding Event: Blister Date Acquired: 10/08/2018 Weeks Of Treatment: 5 Clustered Wound: No Wound Measurements Length: (cm) 29 Width: (cm) 16 Depth: (cm) 0.1 Area: (cm) 364.425 Volume: (cm) 36.442 % Reduction in Area: -1127.5% % Reduction in Volume: -1127.4% Wound Description Full Thickness Without Exposed Support Classification: Structures Treatment Notes Wound #3 (Left, Medial Lower Leg) Notes abd pads, zetuvit, kerlix and ace wrap on the left , abd pads secured with tape on the right Electronic Signature(s) Signed: 04/01/2019 10:47:16 AM By: [name] Entered By: [name] on 04/01/2019 10:22:30 Ribas, [name] ([id number]) -------------------------------------------------------------------------------- Wound Assessment Details Patient Name: Elizabeth Landau C. Date of Service: 04/01/2019 10:00 AM Medical Record Number: [id number] Patient Account Number: [id number] Date of Birth/Sex: [birth year] (63 y.o. F) Treating RN: [name] Primary Care Shakeema Lippman: [name] Other Clinician: Referring  Wynn Kernes: [name] Treating Abdiaziz Klahn/Extender: Ricard Dillon Weeks in Treatment: 5 Wound Status Wound Number: 4 Primary Etiology: Lymphedema Wound Location: Right, Medial Lower Leg Wound Status: Open Wounding Event: Gradually Appeared Date Acquired: 02/02/2019 Weeks Of Treatment: 5 Clustered Wound: No Wound Measurements Length: (cm) 0.1 Width: (cm) 0.1 Depth: (cm) 0.1 Area: (cm) 0.008 Volume: (cm) 0.001 % Reduction in Area: 100% % Reduction in Volume: 100% Wound Description Full Thickness Without Exposed Support  Classification: Structures Treatment Notes Wound #4 (Right, Medial Lower Leg) Notes abd pads secured with tape on the right Electronic Signature(s) Signed: 04/01/2019 10:47:16 AM By: [name] Entered By: [name] on 04/01/2019 10:22:30

## 2019-04-02 ENCOUNTER — Ambulatory Visit (INDEPENDENT_AMBULATORY_CARE_PROVIDER_SITE_OTHER): Payer: Medicaid Other | Admitting: Vascular Surgery

## 2019-04-03 ENCOUNTER — Other Ambulatory Visit: Payer: Self-pay

## 2019-04-03 ENCOUNTER — Encounter: Payer: Medicaid Other | Admitting: Physician Assistant

## 2019-04-03 DIAGNOSIS — L97812 Non-pressure chronic ulcer of other part of right lower leg with fat layer exposed: Secondary | ICD-10-CM | POA: Diagnosis not present

## 2019-04-03 NOTE — Progress Notes (Addendum)
ROBERTA, GOLDMAN ([id number]) Visit Report for 04/03/2019 Chief Complaint Document Details Patient Name: [name], [name]. Date of Service: 04/03/2019 9:15 AM Medical Record Number: [id number] Patient Account Number: [id number] Date of Birth/Sex: 11-14-1955 (63 y.o. F) Treating RN: Montey Hora Primary Care Provider: Tandy Gaw Other Clinician: Referring Provider: Tandy Gaw Treating Provider/Extender: Melburn Hake, HOYT Weeks in Treatment: 6 Information Obtained from: Patient Chief Complaint Left LE Ulcer Electronic Signature(s) Signed: 04/03/2019 9:31:32 AM By: Worthy Keeler PA-C Entered By: Worthy Keeler on 04/03/2019 Aucilla, [name] ([id number]) -------------------------------------------------------------------------------- HPI Details Patient Name: [name]. Date of Service: 04/03/2019 9:15 AM Medical Record Number: [id number] Patient Account Number: [id number] Date of Birth/Sex: 1955/08/16 (63 y.o. F) Treating RN: Montey Hora Primary Care Provider: Tandy Gaw Other Clinician: Referring Provider: Tandy Gaw Treating Provider/Extender: STONE III, HOYT Weeks in Treatment: 6 History of Present Illness HPI Description: The patient is a 63 year old female with history of hypertension and a long-standing history of bilateral lower extremity lymphedema (first presented on 4/2) . She has had open ulcers in the past which have always responded to compression therapy. She had briefly been to a lymphedema clinic in the past which helped her at the time. this time around she stopped treatment of her lymphedema pumps approximately 2 weeks ago because of some pain in the knees and then noticed the right leg getting worse. She was seen by her PCP who put her on clindamycin 4 times a day 2 days ago. The patient has seen [facility] and Dr. Delana Meyer had seen her last year where a vascular study including venous and arterial duplex studies were within normal limits. he  had recommended compression stockings and lymphedema pumps and the patient has been using this in about 2 weeks ago. She is known to be diabetic but in the past few time she's gone to her primary care doctor her hemoglobin A1c has been normal. 02/11/2015 - after her last visit she took my advice and went to the ER regarding the progressive cellulitis of her right lower extremity and she was admitted between July 17 and 22nd. She received IV antibiotics and then was sent home on a course of steroid-induced and oral antibiotics. She has improved much since then. 02/17/2015 -- she has been doing fine and the weeping of her legs has remarkably gone down. She has no fresh issues. READMISSION 01/15/18 This patient was given this clinic before most recently in 2016 seen by Dr. Con Memos. She has massive bilateral lymphedema and over the last 2 months this had weeping edema out of the left leg. She has compression pumps but her compliance with these has been minimal. She has [facility] they've been using TCA/ABDs/kerlix under an Ace wrap.she has had recent problems with cellulitis. She was apparently seen in the ER and 12/23/17 and given clindamycin. She was then followed by her primary doctor and given doxycycline and Keflex. The pain seems to have settled down. In April 2018 the patient had arterial studies done at [facility]. This showed triphasic waveforms throughout the right leg and mostly triphasic waveforms on the left except for monophasic at the posterior tibial artery distally. She was not felt to have evidence of right lower extremity arterial stenosis or significant problems on the left side. 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 She also had a right lower extremity venous Doppler in January 2018 this was limited by the patient's body habitus and lymphedema. Most of the  proximal veins were not visualized The patient presents with an area of  denuded skin on the anterior medial part of the left calf. There is weeping edema fluid here. 01/22/18; the patient has somewhat better edema control using her compression pumps twice a day and as a result she has much better epithelialization on the left anterior calf area. Only a small open area remains. 01/29/18; the patient has been compliant with her compression pumps. Both the areas on her calf that healed. The remaining area on the left anterior leg is fully epithelialized Readmission: 02/20/2019 upon evaluation today patient presents for reevaluation due to issues that she is having with the bilateral lower extremities. She actually has wounds open on both legs. On the right she has an area in the crease of her leg on the right around the knee region which is actually draining quite a bit and actually has some fungal type appearance to it. She has been on nystatin powder that seems to have helped to some degree. In regard to the left lower extremity this is actually in the [place]. ([id number]) lower portion of her leg closer to the ankle and again is continuing to drain as well unfortunately. There does not appear to be any signs of active infection at this time which is good news. No fevers, chills, nausea, vomiting, or diarrhea. She tells me that since she was seen last year she is actually been doing quite well for the most part with regard to her lower extremities. Unfortunately she now is experiencing a little bit more drainage at this time. She is concerned about getting this under control so that it does not get significantly worse. 02/27/2019 on evaluation today patient appears to be doing somewhat better in regard to her bilateral lower extremity wounds. She has been tolerating the dressing changes without complication. Fortunately there is no signs of active infection at this point. No fevers, chills, nausea, vomiting, or diarrhea. She did get her dressing supplies which is  excellent news she was extremely excited to get these. She also got paperwork from [facility] for their financial assistance program where they may be able to help her out in the future if needed with supplies at discounted prices. 03/06/2019 on evaluation today patient appears to be doing a little worse with regard to both areas of weeping on her bilateral lower extremities. This is around the right medial knee and just above the left ankle. With that being said she is unfortunately not doing as well as I would like to see. I feel like she may need to potentially go see someone at the lymphedema clinic as the wraps that she needs or even beyond what we can do here at the wound care center. She really does not have wounds she just has open areas of weeping that are causing some difficulty for her. Subsequently because of this and the moisture I am concerned about the potential for infection I am going to likely give her a prophylactic antibiotic today, Keflex, just to be on the safe side. Nonetheless again there is no obvious signs of active infection at this time. 03/13/2019 on evaluation today patient appears to be doing well with regard to her bilateral lower extremities where she has been weeping compared to even last week's evaluation. I see some areas of new skin growth which is excellent and overall I am very pleased with how things seem to be progressing. No fevers, chills, nausea, vomiting, or diarrhea. 03/20/2019 on evaluation  today patient unfortunately is continuing to have issues with significant edema of the left lower extremity. Her right side seems to be doing much better. Unfortunately her left side is showing increased weeping of the lower portion of her leg. This is quite unfortunate obviously we were hoping to get her into the lymphedema clinic they really do not seem to when I see her how if she is draining. Despite the fact this is really not wound related but more lymphedema  weeping related. Nonetheless I do not know that this can be helpful for her to even go for that appointment since again I am not sure there is much that they would actually do at this point. We may need to try a 4 layer compression wrap as best we can on her leg. She is on the Augmentin currently although I am still concerned about whether or not there could be potentially something going on infection wise I would obtain a culture though I understand is not the best being that is a surface culture I just 1 to make sure I do not seem to be missing anything. 03/27/2019 on evaluation today patient appears to be doing much better in regard to the left lower extremity compared to last week. Last week she had tremendous weeping which I think was subsequent to infection now she seems to be doing much better and very pleased. This is not completely healed but there is a lot of new skin growth and it has dried out quite a bit. Overall I think that we are doing well with how things are moving along at this time. No fevers, chills, nausea, vomiting, or diarrhea. 04/03/2019 evaluation today patient appears to be doing a little worse this week compared to last time I saw her. I think this may be due to the fact that she is having issues with not being able to sleep in her bed at least not until last night. She is therefore been in a lift chair and subsequently has also had issues with not been able to use her pumps since she could not get in bed. With that being said the patient overall seems to be doing okay I do think I may want extend the antibiotic for a little bit longer at least until we can see if her edema and her weeping gets better and if it is then obviously I can always discontinue the antibiotics as of next week however I want her to continue to have it over the next week. Electronic Signature(s) Signed: 04/03/2019 9:43:23 AM By: Worthy Keeler PA-C Entered By: Worthy Keeler on 04/03/2019  09:43:23 Silvernail, [name] ([id number]) -------------------------------------------------------------------------------- Physical Exam Details Patient Name: SHIVAUN, AVRIL C. Date of Service: 04/03/2019 9:15 AM Medical Record Number: [id number] Patient Account Number: [id number] Date of Birth/Sex: [birth year] (63 y.o. F) Treating RN: Montey Hora Primary Care Provider: Tandy Gaw Other Clinician: Referring Provider: Tandy Gaw Treating Provider/Extender: STONE III, HOYT Weeks in Treatment: 6 Constitutional Obese and well-hydrated in no acute distress. Respiratory normal breathing without difficulty. clear to auscultation bilaterally. Cardiovascular regular rate and rhythm with normal S1, S2. Psychiatric this patient is able to make decisions and demonstrates good insight into disease process. Alert and Oriented x 3. pleasant and cooperative. Notes Patient's wound bed currently again on the right actually showed signs of improvement with regard to weeping which she really does not seem to have any. With regard to the left lower extremity this is where the majority of her  weeping and irritation is and again likely this is somewhat worse due to the fact that because of her back she has not been able to get in the bed and she has not been able to use her pumps. The good news is she tells me that last night she was able to actually lay down and when she did she felt something pop in her back and she has not had any pain since. This is excellent news. Electronic Signature(s) Signed: 04/03/2019 9:44:52 AM By: Worthy Keeler PA-C Entered By: Worthy Keeler on 04/03/2019 09:44:52 [name], [name] ([id number]) -------------------------------------------------------------------------------- Physician Orders Details Patient Name: [name] Date of Service: 04/03/2019 9:15 AM Medical Record Number: [id number] Patient Account Number: [id number] Date of Birth/Sex: 1955-08-07 (63 y.o.  F) Treating RN: Montey Hora Primary Care Provider: Tandy Gaw Other Clinician: Referring Provider: Tandy Gaw Treating Provider/Extender: STONE III, HOYT Weeks in Treatment: 6 Verbal / Phone Orders: No Diagnosis Coding ICD-10 Coding Code Description I89.0 Lymphedema, not elsewhere classified L97.822 Non-pressure chronic ulcer of other part of left lower leg with fat layer exposed L97.812 Non-pressure chronic ulcer of other part of right lower leg with fat layer exposed Wound Cleansing Wound #3 Left,Medial Lower Leg o Dial antibacterial soap, wash wounds, rinse and pat dry prior to dressing wounds o May Shower, gently pat wound dry prior to applying new dressing. Primary Wound Dressing Wound #3 Left,Medial Lower Leg o ABD Pad o XtraSorb - or Zetuvit or KerraMax Secondary Dressing Wound #3 Left,Medial Lower Leg o Conform/Kerlix Dressing Change Frequency Wound #3 Left,Medial Lower Leg o Change Dressing Monday, Wednesday, Friday Follow-up Appointments o Return Appointment in 1 week. o Nurse Visit as needed - Monday and Wednesday Edema Control Wound #3 Left,Medial Lower Leg o Other: - ACE Wrap or 3rd layer of 4 layer wrap Patient Medications Allergies: ibuprofen, ACE Inhibitors Notifications Medication Indication Start End Cipro 04/03/2019 DOSE 1 - oral 500 mg tablet - 1 tablet oral taken 2 times a day for 14 days to extend patient's current regimen. [name], [name] ([id number]) Electronic Signature(s) Signed: 04/03/2019 9:47:42 AM By: Worthy Keeler PA-C Entered By: Worthy Keeler on 04/03/2019 09:47:41 [place], [name] ([id number]) -------------------------------------------------------------------------------- Problem List Details Patient Name: [name], [name]. Date of Service: 04/03/2019 9:15 AM Medical Record Number: [id number] Patient Account Number: [id number] Date of Birth/Sex: 02/05/1956 (63 y.o. F) Treating RN: Montey Hora Primary  Care Provider: Tandy Gaw Other Clinician: Referring Provider: Tandy Gaw Treating Provider/Extender: Melburn Hake, HOYT Weeks in Treatment: 6 Active Problems ICD-10 Evaluated Encounter Code Description Active Date Today Diagnosis I89.0 Lymphedema, not elsewhere classified 02/20/2019 No Yes L97.822 Non-pressure chronic ulcer of other part of left lower leg with 02/20/2019 No Yes fat layer exposed L97.812 Non-pressure chronic ulcer of other part of right lower leg 02/20/2019 No Yes with fat layer exposed Inactive Problems Resolved Problems Electronic Signature(s) Signed: 04/03/2019 9:31:26 AM By: Worthy Keeler PA-C Entered By: Worthy Keeler on 04/03/2019 09:31:26 [name], [name] ([id number]) -------------------------------------------------------------------------------- Progress Note Details Patient Name: [name]. Date of Service: 04/03/2019 9:15 AM Medical Record Number: [id number] Patient Account Number: [id number] Date of Birth/Sex: [birth year] (63 y.o. F) Treating RN: Montey Hora Primary Care Provider: Tandy Gaw Other Clinician: Referring Provider: Tandy Gaw Treating Provider/Extender: STONE III, HOYT Weeks in Treatment: 6 Subjective Chief Complaint Information obtained from Patient Left LE Ulcer History of Present Illness (HPI) The patient is a 63 year old female with history of hypertension and a long-standing history of bilateral  lower extremity lymphedema (first presented on 4/2) . She has had open ulcers in the past which have always responded to compression therapy. She had briefly been to a lymphedema clinic in the past which helped her at the time. this time around she stopped treatment of her lymphedema pumps approximately 2 weeks ago because of some pain in the knees and then noticed the right leg getting worse. She was seen by her PCP who put her on clindamycin 4 times a day 2 days ago. The patient has seen [facility] and Dr. Delana Meyer had seen her  last year where a vascular study including venous and arterial duplex studies were within normal limits. he had recommended compression stockings and lymphedema pumps and the patient has been using this in about 2 weeks ago. She is known to be diabetic but in the past few time she's gone to her primary care doctor her hemoglobin A1c has been normal. 02/11/2015 - after her last visit she took my advice and went to the ER regarding the progressive cellulitis of her right lower extremity and she was admitted between July 17 and 22nd. She received IV antibiotics and then was sent home on a course of steroid-induced and oral antibiotics. She has improved much since then. 02/17/2015 -- she has been doing fine and the weeping of her legs has remarkably gone down. She has no fresh issues. READMISSION 01/15/18 This patient was given this clinic before most recently in 2016 seen by Dr. Con Memos. She has massive bilateral lymphedema and over the last 2 months this had weeping edema out of the left leg. She has compression pumps but her compliance with these has been minimal. She has [facility] they've been using TCA/ABDs/kerlix under an Ace wrap.she has had recent problems with cellulitis. She was apparently seen in the ER and 12/23/17 and given clindamycin. She was then followed by her primary doctor and given doxycycline and Keflex. The pain seems to have settled down. In April 2018 the patient had arterial studies done at [facility]. This showed triphasic waveforms throughout the right leg and mostly triphasic waveforms on the left except for monophasic at the posterior tibial artery distally. She was not felt to have evidence of right lower extremity arterial stenosis or significant problems on the left side. 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 She also had a right lower extremity venous Doppler in January 2018 this was limited by the patient's body  habitus and lymphedema. Most of the proximal veins were not visualized The patient presents with an area of denuded skin on the anterior medial part of the left calf. There is weeping edema fluid here. 01/22/18; the patient has somewhat better edema control using her compression pumps twice a day and as a result she has much better epithelialization on the left anterior calf area. Only a small open area remains. 01/29/18; the patient has been compliant with her compression pumps. Both the areas on her calf that healed. The remaining area on the left anterior leg is fully epithelialized [name], [name]. ([id number]) Readmission: 02/20/2019 upon evaluation today patient presents for reevaluation due to issues that she is having with the bilateral lower extremities. She actually has wounds open on both legs. On the right she has an area in the crease of her leg on the right around the knee region which is actually draining quite a bit and actually has some fungal type appearance to it. She has been on nystatin  powder that seems to have helped to some degree. In regard to the left lower extremity this is actually in the lower portion of her leg closer to the ankle and again is continuing to drain as well unfortunately. There does not appear to be any signs of active infection at this time which is good news. No fevers, chills, nausea, vomiting, or diarrhea. She tells me that since she was seen last year she is actually been doing quite well for the most part with regard to her lower extremities. Unfortunately she now is experiencing a little bit more drainage at this time. She is concerned about getting this under control so that it does not get significantly worse. 02/27/2019 on evaluation today patient appears to be doing somewhat better in regard to her bilateral lower extremity wounds. She has been tolerating the dressing changes without complication. Fortunately there is no signs of active infection  at this point. No fevers, chills, nausea, vomiting, or diarrhea. She did get her dressing supplies which is excellent news she was extremely excited to get these. She also got paperwork from [employer] for their financial assistance program where they may be able to help her out in the future if needed with supplies at discounted prices. 03/06/2019 on evaluation today patient appears to be doing a little worse with regard to both areas of weeping on her bilateral lower extremities. This is around the right medial knee and just above the left ankle. With that being said she is unfortunately not doing as well as I would like to see. I feel like she may need to potentially go see someone at the lymphedema clinic as the wraps that she needs or even beyond what we can do here at the wound care center. She really does not have wounds she just has open areas of weeping that are causing some difficulty for her. Subsequently because of this and the moisture I am concerned about the potential for infection I am going to likely give her a prophylactic antibiotic today, Keflex, just to be on the safe side. Nonetheless again there is no obvious signs of active infection at this time. 03/13/2019 on evaluation today patient appears to be doing well with regard to her bilateral lower extremities where she has been weeping compared to even last week's evaluation. I see some areas of new skin growth which is excellent and overall I am very pleased with how things seem to be progressing. No fevers, chills, nausea, vomiting, or diarrhea. 03/20/2019 on evaluation today patient unfortunately is continuing to have issues with significant edema of the left lower extremity. Her right side seems to be doing much better. Unfortunately her left side is showing increased weeping of the lower portion of her leg. This is quite unfortunate obviously we were hoping to get her into the lymphedema clinic they really do not seem to when I see  her how if she is draining. Despite the fact this is really not wound related but more lymphedema weeping related. Nonetheless I do not know that this can be helpful for her to even go for that appointment since again I am not sure there is much that they would actually do at this point. We may need to try a 4 layer compression wrap as best we can on her leg. She is on the Augmentin currently although I am still concerned about whether or not there could be potentially something going on infection wise I would obtain a culture though I understand  is not the best being that is a surface culture I just 1 to make sure I do not seem to be missing anything. 03/27/2019 on evaluation today patient appears to be doing much better in regard to the left lower extremity compared to last week. Last week she had tremendous weeping which I think was subsequent to infection now she seems to be doing much better and very pleased. This is not completely healed but there is a lot of new skin growth and it has dried out quite a bit. Overall I think that we are doing well with how things are moving along at this time. No fevers, chills, nausea, vomiting, or diarrhea. 04/03/2019 evaluation today patient appears to be doing a little worse this week compared to last time I saw her. I think this may be due to the fact that she is having issues with not being able to sleep in her bed at least not until last night. She is therefore been in a lift chair and subsequently has also had issues with not been able to use her pumps since she could not get in bed. With that being said the patient overall seems to be doing okay I do think I may want extend the antibiotic for a little bit longer at least until we can see if her edema and her weeping gets better and if it is then obviously I can always discontinue the antibiotics as of next week however I want her to continue to have it over the next week. Patient History Information  obtained from Patient. Family History Cancer - Father, Diabetes - Father, Heart Disease - Mother, Hypertension - Mother,Father, Seizures - Mother, Stroke - Mother, Thyroid Problems - [place], [name], [name]. ([id number]) No family history of Hereditary Spherocytosis, Kidney Disease, Lung Disease, Tuberculosis. Social History Former smoker - uses snuff, Marital Status - Single, Alcohol Use - Rarely, Drug Use - No History, Caffeine Use - Daily. 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 Denies history of Aspiration, Chronic Obstructive Pulmonary Disease (COPD), Pneumothorax Cardiovascular Patient has history of Hypertension Denies history of Angina, Arrhythmia, Congestive Heart Failure, Coronary Artery Disease, Deep Vein Thrombosis, Hypotension, Myocardial Infarction, Peripheral Arterial Disease, Peripheral Venous Disease, Phlebitis, Vasculitis Gastrointestinal Denies history of Cirrhosis , Colitis, Crohn s, Hepatitis A, Hepatitis B, Hepatitis C Endocrine Denies history of Type I Diabetes, Type II Diabetes Genitourinary Denies history of End Stage Renal Disease Immunological Denies history of Lupus Erythematosus, Raynaud s, Scleroderma Integumentary (Skin) Denies history of History of Burn, History of pressure wounds Musculoskeletal Patient has history of Gout, Osteoarthritis Denies history of Rheumatoid Arthritis, Osteomyelitis Neurologic Denies history of Dementia, Neuropathy, Quadriplegia, Paraplegia, Seizure Disorder Oncologic Denies history of Received  Chemotherapy, Received Radiation Psychiatric Denies history of Anorexia/bulimia, Confinement Anxiety Review of Systems (ROS) Constitutional Symptoms (General Health) Denies complaints or symptoms of Fatigue, Fever, Chills, Marked Weight Change. Respiratory Denies complaints or symptoms of Chronic or frequent coughs, Shortness of Breath. Cardiovascular Denies complaints or symptoms of Chest pain, LE edema. Psychiatric Denies complaints or symptoms of Anxiety, Claustrophobia. Objective [name], [name] ([id number]) Constitutional Obese and well-hydrated in no acute distress. Vitals Time Taken: 9:15 AM, Height: 62 in, Weight: 390  lbs, BMI: 71.3, Temperature: 98.8 F, Pulse: 68 bpm, Respiratory Rate: 20 breaths/min, Blood Pressure: 163/81 mmHg. Respiratory normal breathing without difficulty. clear to auscultation bilaterally. Cardiovascular regular rate and rhythm with normal S1, S2. Psychiatric this patient is able to make decisions and demonstrates good insight into disease process. Alert and Oriented x 3. pleasant and cooperative. General Notes: Patient's wound bed currently again on the right actually showed signs of improvement with regard to weeping which she really does not seem to have any. With regard to the left lower extremity this is where the majority of her weeping and irritation is and again likely this is somewhat worse due to the fact that because of her back she has not been able to get in the bed and she has not been able to use her pumps. The good news is she tells me that last night she was able to actually lay down and when she did she felt something pop in her back and she has not had any pain since. This is excellent news. Integumentary (Hair, Skin) Wound #3 status is Open. Original cause of wound was Blister. The wound is located on the Left,Medial Lower Leg. The wound measures 16cm length x 39cm width x 0.1cm depth; 490.088cm^2 area and 49.009cm^3  volume. Wound #4 status is Healed - Epithelialized. Original cause of wound was Gradually Appeared. The wound is located on the Right,Medial Lower Leg. The wound measures 0cm length x 0cm width x 0cm depth; 0cm^2 area and 0cm^3 volume. Assessment Active Problems ICD-10 Lymphedema, not elsewhere classified Non-pressure chronic ulcer of other part of left lower leg with fat layer exposed Non-pressure chronic ulcer of other part of right lower leg with fat layer exposed Plan Wound Cleansing: Wound #3 Left,Medial Lower Leg: Dial antibacterial soap, wash wounds, rinse and pat dry prior to dressing wounds May Shower, gently pat wound dry prior to applying new dressing. Primary Wound Dressing: Wound #3 Left,Medial Lower Leg: ABD Pad [name], [name]. ([id number]) XtraSorb - or Zetuvit or KerraMax Secondary Dressing: Wound #3 Left,Medial Lower Leg: Conform/Kerlix Dressing Change Frequency: Wound #3 Left,Medial Lower Leg: Change Dressing Monday, Wednesday, Friday Follow-up Appointments: Return Appointment in 1 week. Nurse Visit as needed - Monday and Wednesday Edema Control: Wound #3 Left,Medial Lower Leg: Other: - ACE Wrap or 3rd layer of 4 layer wrap The following medication(s) was prescribed: Cipro oral 500 mg tablet 1 1 tablet oral taken 2 times a day for 14 days to extend patient's current regimen. starting 04/03/2019 1. I would recommend that she continue to use her lymphedema pumps as frequently as possible I think that this is appropriate and now that she can use those again I am hoping that this will indeed start to dry out more as it seemed to have been previously. 2. I am going to suggest as well that she continue to wrap her legs as she has been doing. She is typically changing this every other day. 3. We will currently use absorptive pads here in the office in order to help with weeping control. 4. I am going to go and send in a refill for the Cipro for her as that does seem  to have helped her left lower extremity I am not 100% sure that she is completely cleared I want to make sure that that is indeed the case before I discontinue the medication completely. For that reason I will go ahead and again send in a 2-week refill we will stop it when we  need to. We will see patient back for reevaluation in 1 week here in the clinic. If anything worsens or changes patient will contact our office for additional recommendations. Electronic Signature(s) Signed: 04/03/2019 9:47:52 AM By: Worthy Keeler PA-C Previous Signature: 04/03/2019 9:46:21 AM Version By: Worthy Keeler PA-C Entered By: Worthy Keeler on 04/03/2019 09:47:52 [name], [name] ([id number]) -------------------------------------------------------------------------------- ROS/PFSH Details Patient Name: [name] Date of Service: 04/03/2019 9:15 AM Medical Record Number: [id number] Patient Account Number: [id number] Date of Birth/Sex: Oct 15, 1955 (63 y.o. 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 Negative for: Aspiration; Chronic Obstructive Pulmonary Disease (COPD); Pneumothorax Cardiovascular Complaints and Symptoms: Negative for: Chest pain; LE edema Medical History: Positive for: Hypertension Negative for: Angina; Arrhythmia; Congestive Heart Failure; Coronary Artery Disease; Deep Vein Thrombosis; Hypotension; Myocardial Infarction; Peripheral Arterial Disease; Peripheral Venous  Disease; Phlebitis; Vasculitis Psychiatric Complaints and Symptoms: Negative for: Anxiety; Claustrophobia Medical History: Negative for: Anorexia/bulimia; Confinement Anxiety Eyes Medical History: Positive for: Cataracts Negative for: Glaucoma; Optic Neuritis Ear/Nose/Mouth/Throat Medical History: Negative for: Chronic sinus problems/congestion; Middle ear problems Hematologic/Lymphatic Medical History: Positive for: Lymphedema [name], MCLEISH. ([id number]) Negative for: Anemia; Hemophilia; Human Immunodeficiency Virus; Sickle Cell Disease Gastrointestinal Medical History: Negative for: Cirrhosis ; Colitis; Crohnos; Hepatitis A; Hepatitis B; Hepatitis C Endocrine Medical History: Negative for: Type I Diabetes; Type II Diabetes Genitourinary Medical History: Negative for: End Stage Renal Disease Immunological Medical History: Negative for: Lupus Erythematosus; Raynaudos; Scleroderma Integumentary (Skin) Medical History: Negative for: History of Burn; History of pressure wounds Musculoskeletal Medical History: Positive for: Gout; Osteoarthritis Negative for: Rheumatoid Arthritis; Osteomyelitis Neurologic Medical History: Negative for: Dementia; Neuropathy; Quadriplegia; Paraplegia; Seizure Disorder Oncologic Medical History: Negative for: Received Chemotherapy; Received Radiation HBO Extended History Items Eyes: Cataracts Immunizations Pneumococcal Vaccine: Received Pneumococcal Vaccination: Yes Implantable Devices No devices added Family and Social History Cancer: Yes - Father; Diabetes: Yes - Father; Heart Disease: Yes - Mother; Hereditary Spherocytosis: No; Hypertension: Yes - Mother,Father; Kidney Disease: No; Lung Disease: No; Seizures: Yes - Mother; Stroke: Yes - Mother; Thyroid Problems: Yes - Mother,Siblings; Tuberculosis: No; Former smoker - uses snuff; Marital Status - Single; Alcohol Use: Rarely; Drug Use: NOELENE, RINES C. ([id number]) No History;  Caffeine Use: Daily; Financial Concerns: No; Food, Clothing or Shelter Needs: No; Support System Lacking: No; Transportation Concerns: No Physician Affirmation I have reviewed and agree with the above information. Electronic Signature(s) Signed: 04/03/2019 4:44:47 PM By: Montey Hora Signed: 04/03/2019 5:02:54 PM By: Worthy Keeler PA-C Entered By: Worthy Keeler on 04/03/2019 09:44:22 Nordquist, [name] ([id number]) -------------------------------------------------------------------------------- SuperBill Details Patient Name: [name]. Date of Service: 04/03/2019 Medical Record Number: [id number] Patient Account Number: [id number] Date of Birth/Sex: [birth year] (63 y.o. F) Treating RN: Montey Hora Primary Care Provider: Tandy Gaw Other Clinician: Referring Provider: Tandy Gaw Treating Provider/Extender: STONE III, HOYT Weeks in Treatment: 6 Diagnosis Coding ICD-10 Codes Code Description I89.0 Lymphedema, not elsewhere classified L97.822 Non-pressure chronic ulcer of other part of left lower leg with fat layer exposed L97.812 Non-pressure chronic ulcer of other part of right lower leg with fat layer exposed Facility Procedures CPT4 Code:  [id number] Description: 99214 - WOUND CARE VISIT-LEV 4 EST PT Modifier: Quantity: 1 Physician Procedures CPT4 Code Description: [id number] - WC PHYS LEVEL 4 - EST PT ICD-10 Diagnosis Description I89.0 Lymphedema, not elsewhere classified L97.822 Non-pressure chronic ulcer of other part of left lower leg wit L97.812 Non-pressure chronic ulcer of other  part of right lower leg wi Modifier: h fat layer expos th fat layer expo Quantity: 1 ed sed Electronic Signature(s) Signed: 04/03/2019 9:48:03 AM By: Worthy Keeler PA-C Entered By: Worthy Keeler on 04/03/2019 09:48:03

## 2019-04-03 NOTE — Progress Notes (Signed)
[name], [name] ([id number]) Visit Report for 04/03/2019 Arrival Information Details Patient Name: [name], [name]. Date of Service: 04/03/2019 9:15 AM Medical Record Number: [id number] Patient Account Number: [id number] Date of Birth/Sex: 1956/02/03 (63 y.o. F) Treating RN: Harold Barban Primary Care Hanan Mcwilliams: Tandy Gaw Other Clinician: Referring Aithana Kushner: Tandy Gaw Treating Lashaun Poch/Extender: STONE III, HOYT Weeks in Treatment: 6 Visit Information History Since Last Visit Added or deleted any medications: No Patient Arrived: Cane Any new allergies or adverse reactions: No Arrival Time: 09:15 Had a fall or experienced change in No Accompanied By: self activities of daily living that may affect Transfer Assistance: None risk of falls: Patient Identification Verified: Yes Signs or symptoms of abuse/neglect since last visito No Secondary Verification Process Completed: Yes Hospitalized since last visit: No Patient Requires Transmission-Based Precautions: No Has Dressing in Place as Prescribed: Yes Patient Has Alerts: No Has Compression in Place as Prescribed: Yes Pain Present Now: No Electronic Signature(s) Signed: 04/03/2019 4:30:52 PM By: Harold Barban Entered By: Harold Barban on 04/03/2019 09:16:18 [place], [name] ([id number]) -------------------------------------------------------------------------------- Clinic Level of Care Assessment Details Patient Name: [name]. Date of Service: 04/03/2019 9:15 AM Medical Record Number: [id number] Patient Account Number: [id number] Date of Birth/Sex: [birth year] (63 y.o. F) Treating RN: Montey Hora Primary Care Hibba Schram: Tandy Gaw Other Clinician: Referring Breelyn Icard: Tandy Gaw Treating Tryphena Perkovich/Extender: STONE III, HOYT Weeks in Treatment: 6 Clinic Level of Care Assessment Items TOOL 4 Quantity Score []  - Use when only an EandM is performed on FOLLOW-UP visit 0 ASSESSMENTS - Nursing Assessment /  Reassessment X - Reassessment of Co-morbidities (includes updates in patient status) 1 10 X- 1 5 Reassessment of Adherence to Treatment Plan ASSESSMENTS - Wound and Skin Assessment / Reassessment []  - Simple Wound Assessment / Reassessment - one wound 0 X- 2 5 Complex Wound Assessment / Reassessment - multiple wounds []  - 0 Dermatologic / Skin Assessment (not related to wound area) ASSESSMENTS - Focused Assessment X - Circumferential Edema Measurements - multi extremities 2 5 []  - 0 Nutritional Assessment / Counseling / Intervention X- 1 5 Lower Extremity Assessment (monofilament, tuning fork, pulses) []  - 0 Peripheral Arterial Disease Assessment (using hand held doppler) ASSESSMENTS - Ostomy and/or Continence Assessment and Care []  - Incontinence Assessment and Management 0 []  - 0 Ostomy Care Assessment and Management (repouching, etc.) PROCESS - Coordination of Care X - Simple Patient / Family Education for ongoing care 1 15 []  - 0 Complex (extensive) Patient / Family Education for ongoing care X- 1 10 Staff obtains Programmer, systems, Records, Test Results / Process Orders []  - 0 Staff telephones HHA, Nursing Homes / Clarify orders / etc []  - 0 Routine Transfer to another Facility (non-emergent condition) []  - 0 Routine Hospital Admission (non-emergent condition) []  - 0 New Admissions / Biomedical engineer / Ordering NPWT, Apligraf, etc. []  - 0 Emergency Hospital Admission (emergent condition) X- 1 10 Simple Discharge Coordination [name], [name]. ([id number]) []  - 0 Complex (extensive) Discharge Coordination PROCESS - Special Needs []  - Pediatric / Minor Patient Management 0 []  - 0 Isolation Patient Management []  - 0 Hearing / Language / Visual special needs []  - 0 Assessment of Community assistance (transportation, D/C planning, etc.) []  - 0 Additional assistance / Altered mentation []  - 0 Support Surface(s) Assessment (bed, cushion, seat, etc.) INTERVENTIONS -  Wound Cleansing / Measurement []  - Simple Wound Cleansing - one wound 0 X- 2 5 Complex Wound Cleansing - multiple wounds X- 1 5 Wound Imaging (photographs - any number  of wounds) []  - 0 Wound Tracing (instead of photographs) []  - 0 Simple Wound Measurement - one wound X- 2 5 Complex Wound Measurement - multiple wounds INTERVENTIONS - Wound Dressings []  - Small Wound Dressing one or multiple wounds 0 []  - 0 Medium Wound Dressing one or multiple wounds X- 1 20 Large Wound Dressing one or multiple wounds []  - 0 Application of Medications - topical []  - 0 Application of Medications - injection INTERVENTIONS - Miscellaneous []  - External ear exam 0 []  - 0 Specimen Collection (cultures, biopsies, blood, body fluids, etc.) []  - 0 Specimen(s) / Culture(s) sent or taken to Lab for analysis []  - 0 Patient Transfer (multiple staff / Civil Service fast streamer / Similar devices) []  - 0 Simple Staple / Suture removal (25 or less) []  - 0 Complex Staple / Suture removal (26 or more) []  - 0 Hypo / Hyperglycemic Management (close monitor of Blood Glucose) []  - 0 Ankle / Brachial Index (ABI) - do not check if billed separately X- 1 5 Vital Signs [name], [name] ([id number]) Has the patient been seen at the hospital within the last three years: Yes Total Score: 125 Level Of Care: New/Established - Level 4 Electronic Signature(s) Signed: 04/03/2019 4:44:47 PM By: Montey Hora Entered By: Montey Hora on 04/03/2019 09:39:07 [name], [name] ([id number]) -------------------------------------------------------------------------------- Encounter Discharge Information Details Patient Name: [name]. Date of Service: 04/03/2019 9:15 AM Medical Record Number: [id number] Patient Account Number: [id number] Date of Birth/Sex: 03/06/1956 (63 y.o. F) Treating RN: Montey Hora Primary Care Thai Burgueno: Tandy Gaw Other Clinician: Referring Luisfernando Brightwell: Tandy Gaw Treating Sheila Ocasio/Extender: Melburn Hake, HOYT Weeks in Treatment: 6 Encounter Discharge Information Items Discharge Condition: Stable Ambulatory Status: Cane Discharge Destination: Home Transportation: Private Auto Accompanied By: self Schedule Follow-up Appointment: Yes Clinical Summary of Care: Electronic Signature(s) Signed: 04/03/2019 4:44:47 PM By: Montey Hora Entered By: Montey Hora on 04/03/2019 09:40:46 Fehnel, [name] ([id number]) -------------------------------------------------------------------------------- Lower Extremity Assessment Details Patient Name: [name]. Date of Service: 04/03/2019 9:15 AM Medical Record Number: [id number] Patient Account Number: [id number] Date of Birth/Sex: 1955/07/22 (63 y.o. F) Treating RN: Harold Barban Primary Care Chloris Marcoux: Tandy Gaw Other Clinician: Referring Tema Alire: Tandy Gaw Treating Alyzah Pelly/Extender: STONE III, HOYT Weeks in Treatment: 6 Edema Assessment Assessed: [Left: No] [Right: No] [Left: Edema] [Right: :] Calf Left: Right: Point of Measurement: 36 cm From Medial Instep 98 cm 104 cm Ankle Left: Right: Point of Measurement: 10 cm From Medial Instep 58.5 cm 58 cm Vascular Assessment Pulses: Dorsalis Pedis Palpable: [Left:Yes] [Right:Yes] Posterior Tibial Palpable: [Left:Yes] [Right:Yes] Electronic Signature(s) Signed: 04/03/2019 4:30:52 PM By: Harold Barban Entered By: Harold Barban on 04/03/2019 09:26:10 Pedregon, [name] ([id number]) -------------------------------------------------------------------------------- Multi Wound Chart Details Patient Name: [name]. Date of Service: 04/03/2019 9:15 AM Medical Record Number: [id number] Patient Account Number: [id number] Date of Birth/Sex: 27-Apr-1956 (63 y.o. F) Treating RN: Montey Hora Primary Care Thurston Brendlinger: Tandy Gaw Other Clinician: Referring Cashtyn Pouliot: Tandy Gaw Treating Annaliza Zia/Extender: STONE III, HOYT Weeks in Treatment: 6 Vital Signs Height(in):  62 Pulse(bpm): 7 Weight(lbs): 390 Blood Pressure(mmHg): 163/81 Body Mass Index(BMI): 71 Temperature(F): 98.8 Respiratory Rate 20 (breaths/min): Photos: [N/A:N/A] Wound Location: Left Lower Leg - Medial Right Lower Leg - Medial N/A Wounding Event: Blister Gradually Appeared N/A Primary Etiology: Lymphedema Lymphedema N/A Comorbid History: Cataracts, Lymphedema, Cataracts, Lymphedema, N/A Asthma, Sleep Apnea, Asthma, Sleep Apnea, Tuberculosis, Hypertension, Tuberculosis, Hypertension, Gout, Osteoarthritis Gout, Osteoarthritis Date Acquired: 10/08/2018 02/02/2019 N/A Weeks of Treatment: 6 6 N/A Wound Status: Open Open N/A Measurements L  x W x D 16x39x0.1 0.1x0.1x0.1 N/A (cm) Area (cm) : 490.088 0.008 N/A Volume (cm) : 49.009 0.001 N/A % Reduction in Area: -1550.80% 100.00% N/A % Reduction in Volume: -1550.70% 100.00% N/A Classification: Full Thickness Without Full Thickness Without N/A Exposed Support Structures Exposed Support Structures Treatment Notes Electronic Signature(s) Signed: 04/03/2019 4:44:47 PM By: Montey Hora Entered By: Montey Hora on 04/03/2019 09:34:53 [name], [name] ([id number]) -------------------------------------------------------------------------------- [place] Details Patient Name: [name]. Date of Service: 04/03/2019 9:15 AM Medical Record Number: [id number] Patient Account Number: [id number] Date of Birth/Sex: January 11, 1956 (63 y.o. F) Treating RN: Montey Hora Primary Care Jax Abdelrahman: Tandy Gaw Other Clinician: Referring Seon Gaertner: Tandy Gaw Treating Blia Totman/Extender: STONE III, HOYT Weeks in Treatment: 6 Active Inactive Abuse / Safety / Falls / Self Care Management Nursing Diagnoses: Potential for falls Goals: Patient will remain injury free related to falls Date Initiated: 02/20/2019 Target Resolution Date: 05/16/2019 Goal Status: Active Interventions: Assess fall risk on admission and as  needed Notes: Orientation to the Wound Care Program Nursing Diagnoses: Knowledge deficit related to the wound healing center program Goals: Patient/caregiver will verbalize understanding of the [facility] Date Initiated: 02/20/2019 Target Resolution Date: 05/16/2019 Goal Status: Active Interventions: Provide education on orientation to the wound center Notes: Venous Leg Ulcer Nursing Diagnoses: Actual venous Insuffiency (use after diagnosis is confirmed) Goals: Patient will maintain optimal edema control Date Initiated: 02/20/2019 Target Resolution Date: 05/16/2019 Goal Status: Active Interventions: Assess peripheral edema status every visit. [name], [name] ([id number]) Compression as ordered Notes: Wound/Skin Impairment Nursing Diagnoses: Impaired tissue integrity Goals: Ulcer/skin breakdown will heal within 14 weeks Date Initiated: 02/20/2019 Target Resolution Date: 05/16/2019 Goal Status: Active Interventions: Assess patient/caregiver ability to obtain necessary supplies Assess patient/caregiver ability to perform ulcer/skin care regimen upon admission and as needed Assess ulceration(s) every visit Notes: Electronic Signature(s) Signed: 04/03/2019 4:44:47 PM By: Montey Hora Entered By: Montey Hora on 04/03/2019 09:34:42 [name], [name] ([id number]) -------------------------------------------------------------------------------- Pain Assessment Details Patient Name: [name]. Date of Service: 04/03/2019 9:15 AM Medical Record Number: [id number] Patient Account Number: [id number] Date of Birth/Sex: Nov 14, 1955 (63 y.o. F) Treating RN: Harold Barban Primary Care Phil Corti: Tandy Gaw Other Clinician: Referring Tashira Torre: Tandy Gaw Treating Adamarys Shall/Extender: STONE III, HOYT Weeks in Treatment: 6 Active Problems Location of Pain Severity and Description of Pain Patient Has Paino No Site Locations Pain Management and  Medication Current Pain Management: Electronic Signature(s) Signed: 04/03/2019 4:30:52 PM By: Harold Barban Entered By: Harold Barban on 04/03/2019 09:17:11 [name], [name] ([id number]) -------------------------------------------------------------------------------- Patient/Caregiver Education Details Patient Name: [name]. Date of Service: 04/03/2019 9:15 AM Medical Record Number: [id number] Patient Account Number: [id number] Date of Birth/Gender: July 30, 1955 (63 y.o. F) Treating RN: Montey Hora Primary Care Physician: Tandy Gaw Other Clinician: Referring Physician: Tandy Gaw Treating Physician/Extender: Melburn Hake, HOYT Weeks in Treatment: 6 Education Assessment Education Provided To: Patient Education Topics Provided Medication Safety: Handouts: Other: take probiotics with antibiotics Methods: Explain/Verbal Responses: State content correctly Venous: Handouts: Other: continue lymph pumps Methods: Explain/Verbal Responses: State content correctly Electronic Signature(s) Signed: 04/03/2019 4:44:47 PM By: Montey Hora Entered By: Montey Hora on 04/03/2019 09:39:50 [name], [name] ([id number]) -------------------------------------------------------------------------------- Wound Assessment Details Patient Name: Elizabeth Landau C. Date of Service: 04/03/2019 9:15 AM Medical Record Number: [id number] Patient Account Number: [id number] Date of Birth/Sex: 27-May-1956 (63 y.o. F) Treating RN: Harold Barban Primary Care Breon Rehm: Tandy Gaw Other Clinician: Referring Brecken Dewoody: Tandy Gaw Treating Lyllian Gause/Extender: STONE III, HOYT Weeks in Treatment: 6 Wound Status Wound Number:  3 Primary Lymphedema Etiology: Wound Location: Left Lower Leg - Medial Wound Open Wounding Event: Blister Status: Date Acquired: 10/08/2018 Comorbid Cataracts, Lymphedema, Asthma, Sleep Weeks Of Treatment: 6 History: Apnea, Tuberculosis, Hypertension, Gout, Clustered  Wound: No Osteoarthritis Photos Wound Measurements Length: (cm) 16 Width: (cm) 39 Depth: (cm) 0.1 Area: (cm) 490.088 Volume: (cm) 49.009 % Reduction in Area: -1550.8% % Reduction in Volume: -1550.7% Wound Description Full Thickness Without Exposed Support Classification: Structures Treatment Notes Wound #3 (Left, Medial Lower Leg) Notes abd pads, zetuvit, xtrasorb, kerlix and ace wrap on the left , abd pads secured with tape on the right Electronic Signature(s) Signed: 04/03/2019 4:30:52 PM By: Harold Barban Entered By: Harold Barban on 04/03/2019 09:27:09 Ellzey, [name] ([id number]) -------------------------------------------------------------------------------- Wound Assessment Details Patient Name: Elizabeth Landau C. Date of Service: 04/03/2019 9:15 AM Medical Record Number: [id number] Patient Account Number: [id number] Date of Birth/Sex: 1956-05-11 (63 y.o. F) Treating RN: Montey Hora Primary Care Torris House: Tandy Gaw Other Clinician: Referring Lakely Elmendorf: Tandy Gaw Treating Liliana Brentlinger/Extender: STONE III, HOYT Weeks in Treatment: 6 Wound Status Wound Number: 4 Primary Lymphedema Etiology: Wound Location: Right, Medial Lower Leg Wound Healed - Epithelialized Wounding Event: Gradually Appeared Status: Date Acquired: 02/02/2019 Comorbid Cataracts, Lymphedema, Asthma, Sleep Weeks Of Treatment: 6 History: Apnea, Tuberculosis, Hypertension, Gout, Clustered Wound: No Osteoarthritis Photos Wound Measurements Length: (cm) Width: (cm) Depth: (cm) Area: (cm) Volume: (cm) 0 % Reduction in Area: 100% 0 % Reduction in Volume: 100% 0 0 0 Wound Description Full Thickness Without Exposed Support Classification: Structures Electronic Signature(s) Signed: 04/03/2019 4:44:47 PM By: Montey Hora Entered By: Montey Hora on 04/03/2019 09:35:43 Staley, [name]  ([id number]) -------------------------------------------------------------------------------- Vitals Details Patient Name: [name]. Date of Service: 04/03/2019 9:15 AM Medical Record Number: [id number] Patient Account Number: [id number] Date of Birth/Sex: 1956-01-02 (63 y.o. F) Treating RN: Harold Barban Primary Care Cordaryl Decelles: Tandy Gaw Other Clinician: Referring Laquandra Carrillo: Tandy Gaw Treating Jhonny Calixto/Extender: STONE III, HOYT Weeks in Treatment: 6 Vital Signs Time Taken: 09:15 Temperature (F): 98.8 Height (in): 62 Pulse (bpm): 68 Weight (lbs): 390 Respiratory Rate (breaths/min): 20 Body Mass Index (BMI): 71.3 Blood Pressure (mmHg): 163/81 Reference Range: 80 - 120 mg / dl Electronic Signature(s) Signed: 04/03/2019 4:30:52 PM By: Harold Barban Entered By: Harold Barban on 04/03/2019 09:19:18

## 2019-04-08 ENCOUNTER — Other Ambulatory Visit: Payer: Self-pay

## 2019-04-08 DIAGNOSIS — L97812 Non-pressure chronic ulcer of other part of right lower leg with fat layer exposed: Secondary | ICD-10-CM | POA: Diagnosis not present

## 2019-04-08 NOTE — Progress Notes (Signed)
QUINCIE, SEISS ([id number]) Visit Report for 04/08/2019 Arrival Information Details Patient Name: [name], [name]. Date of Service: 04/08/2019 8:00 AM Medical Record Number: [id number] Patient Account Number: [id number] Date of Birth/Sex: [birth year] (63 y.o. F) Treating RN: Army Melia Primary Care Maribell Demeo: Tandy Gaw Other Clinician: Referring Cyree Chuong: Tandy Gaw Treating Bruchy Mikel/Extender: Tito Dine in Treatment: 6 Visit Information History Since Last Visit Added or deleted any medications: No Patient Arrived: Cane Any new allergies or adverse reactions: No Arrival Time: 08:08 Had a fall or experienced change in No Accompanied By: self activities of daily living that may affect Transfer Assistance: None risk of falls: Patient Requires Transmission-Based Precautions: No Signs or symptoms of abuse/neglect since last visito No Patient Has Alerts: No Hospitalized since last visit: No Has Dressing in Place as Prescribed: Yes Pain Present Now: No Electronic Signature(s) Signed: 04/08/2019 10:54:02 AM By: Army Melia Entered By: Army Melia on 04/08/2019 08:09:17 [place]osse, [name] ([id number]) -------------------------------------------------------------------------------- Clinic Level of Care Assessment Details Patient Name: [name]. Date of Service: 04/08/2019 8:00 AM Medical Record Number: [id number] Patient Account Number: [id number] Date of Birth/Sex: [birth year] (63 y.o. F) Treating RN: Army Melia Primary Care Kortney Potvin: Tandy Gaw Other Clinician: Referring Fannie Gathright: Tandy Gaw Treating Jaunita Mikels/Extender: Tito Dine in Treatment: 6 Clinic Level of Care Assessment Items TOOL 4 Quantity Score []  - Use when only an EandM is performed on FOLLOW-UP visit 0 ASSESSMENTS - Nursing Assessment / Reassessment X - Reassessment of Co-morbidities (includes updates in patient status) 1 10 X- 1 5 Reassessment of Adherence to Treatment  Plan ASSESSMENTS - Wound and Skin Assessment / Reassessment X - Simple Wound Assessment / Reassessment - one wound 1 5 []  - 0 Complex Wound Assessment / Reassessment - multiple wounds []  - 0 Dermatologic / Skin Assessment (not related to wound area) ASSESSMENTS - Focused Assessment []  - Circumferential Edema Measurements - multi extremities 0 []  - 0 Nutritional Assessment / Counseling / Intervention []  - 0 Lower Extremity Assessment (monofilament, tuning fork, pulses) []  - 0 Peripheral Arterial Disease Assessment (using hand held doppler) ASSESSMENTS - Ostomy and/or Continence Assessment and Care []  - Incontinence Assessment and Management 0 []  - 0 Ostomy Care Assessment and Management (repouching, etc.) PROCESS - Coordination of Care X - Simple Patient / Family Education for ongoing care 1 15 []  - 0 Complex (extensive) Patient / Family Education for ongoing care []  - 0 Staff obtains Programmer, systems, Records, Test Results / Process Orders []  - 0 Staff telephones HHA, Nursing Homes / Clarify orders / etc []  - 0 Routine Transfer to another Facility (non-emergent condition) []  - 0 Routine Hospital Admission (non-emergent condition) []  - 0 New Admissions / Biomedical engineer / Ordering NPWT, Apligraf, etc. []  - 0 Emergency Hospital Admission (emergent condition) X- 1 10 Simple Discharge Coordination MAYFRED, WELSON ([id number]) []  - 0 Complex (extensive) Discharge Coordination PROCESS - Special Needs []  - Pediatric / Minor Patient Management 0 []  - 0 Isolation Patient Management []  - 0 Hearing / Language / Visual special needs []  - 0 Assessment of Community assistance (transportation, D/C planning, etc.) []  - 0 Additional assistance / Altered mentation []  - 0 Support Surface(s) Assessment (bed, cushion, seat, etc.) INTERVENTIONS - Wound Cleansing / Measurement []  - Simple Wound Cleansing - one wound 0 X- 1 5 Complex Wound Cleansing - multiple wounds X- 1 5 Wound  Imaging (photographs - any number of wounds) []  - 0 Wound Tracing (instead of photographs) X- 1 5 Simple Wound Measurement -  one wound []  - 0 Complex Wound Measurement - multiple wounds INTERVENTIONS - Wound Dressings []  - Small Wound Dressing one or multiple wounds 0 []  - 0 Medium Wound Dressing one or multiple wounds X- 1 20 Large Wound Dressing one or multiple wounds []  - 0 Application of Medications - topical []  - 0 Application of Medications - injection INTERVENTIONS - Miscellaneous []  - External ear exam 0 []  - 0 Specimen Collection (cultures, biopsies, blood, body fluids, etc.) []  - 0 Specimen(s) / Culture(s) sent or taken to Lab for analysis []  - 0 Patient Transfer (multiple staff / Civil Service fast streamer / Similar devices) []  - 0 Simple Staple / Suture removal (25 or less) []  - 0 Complex Staple / Suture removal (26 or more) []  - 0 Hypo / Hyperglycemic Management (close monitor of Blood Glucose) []  - 0 Ankle / Brachial Index (ABI) - do not check if billed separately []  - 0 Vital Signs [name], [name] ([id number]) Has the patient been seen at the hospital within the last three years: Yes Total Score: 80 Level Of Care: New/Established - Level 3 Electronic Signature(s) Signed: 04/08/2019 10:54:02 AM By: Army Melia Entered By: Army Melia on 04/08/2019 08:29:36 [name], [name] ([id number]) -------------------------------------------------------------------------------- Wound Assessment Details Patient Name: Elizabeth Landau C. Date of Service: 04/08/2019 8:00 AM Medical Record Number: [id number] Patient Account Number: [id number] Date of Birth/Sex: 12-Apr-1956 (63 y.o. F) Treating RN: Army Melia Primary Care Brallan Denio: Tandy Gaw Other Clinician: Referring Cristol Engdahl: Tandy Gaw Treating Lemar Bakos/Extender: Ricard Dillon Weeks in Treatment: 6 Wound Status Wound Number: 3 Primary Etiology: Lymphedema Wound Location: Left, Medial Lower Leg Wound Status:  Open Wounding Event: Blister Date Acquired: 10/08/2018 Weeks Of Treatment: 6 Clustered Wound: No Wound Measurements Length: (cm) 16 Width: (cm) 39 Depth: (cm) 0.1 Area: (cm) 490.088 Volume: (cm) 49.009 % Reduction in Area: -1550.8% % Reduction in Volume: -1550.7% Wound Description Full Thickness Without Exposed Support Classification: Structures Treatment Notes Wound #3 (Left, Medial Lower Leg) Notes abd pads, zetuvit, xtrasorb, kerlix and ace wrap on the left Electronic Signature(s) Signed: 04/08/2019 10:54:02 AM By: Army Melia Entered By: Army Melia on 04/08/2019 08:28:41

## 2019-04-10 ENCOUNTER — Inpatient Hospital Stay
Admission: EM | Admit: 2019-04-10 | Discharge: 2019-04-14 | DRG: 603 | Disposition: A | Discharge status: Home / Self Care | Payer: Medicaid Other | Source: Ambulatory Visit | Attending: Internal Medicine | Admitting: Internal Medicine

## 2019-04-10 ENCOUNTER — Encounter: Payer: Medicaid Other | Attending: Physician Assistant | Admitting: Physician Assistant

## 2019-04-10 ENCOUNTER — Other Ambulatory Visit: Payer: Self-pay

## 2019-04-10 ENCOUNTER — Encounter: Payer: Self-pay | Admitting: Intensive Care

## 2019-04-10 ENCOUNTER — Inpatient Hospital Stay: Payer: Medicaid Other

## 2019-04-10 DIAGNOSIS — J45901 Unspecified asthma with (acute) exacerbation: Secondary | ICD-10-CM | POA: Diagnosis present

## 2019-04-10 DIAGNOSIS — Z888 Allergy status to other drugs, medicaments and biological substances status: Secondary | ICD-10-CM | POA: Diagnosis not present

## 2019-04-10 DIAGNOSIS — Z881 Allergy status to other antibiotic agents status: Secondary | ICD-10-CM | POA: Insufficient documentation

## 2019-04-10 DIAGNOSIS — Z8349 Family history of other endocrine, nutritional and metabolic diseases: Secondary | ICD-10-CM

## 2019-04-10 DIAGNOSIS — L97822 Non-pressure chronic ulcer of other part of left lower leg with fat layer exposed: Secondary | ICD-10-CM | POA: Insufficient documentation

## 2019-04-10 DIAGNOSIS — Z79899 Other long term (current) drug therapy: Secondary | ICD-10-CM

## 2019-04-10 DIAGNOSIS — Z23 Encounter for immunization: Secondary | ICD-10-CM | POA: Diagnosis not present

## 2019-04-10 DIAGNOSIS — Z886 Allergy status to analgesic agent status: Secondary | ICD-10-CM | POA: Insufficient documentation

## 2019-04-10 DIAGNOSIS — Z6841 Body Mass Index (BMI) 40.0 and over, adult: Secondary | ICD-10-CM

## 2019-04-10 DIAGNOSIS — R0602 Shortness of breath: Secondary | ICD-10-CM

## 2019-04-10 DIAGNOSIS — L03116 Cellulitis of left lower limb: Secondary | ICD-10-CM | POA: Diagnosis not present

## 2019-04-10 DIAGNOSIS — R0603 Acute respiratory distress: Secondary | ICD-10-CM | POA: Diagnosis present

## 2019-04-10 DIAGNOSIS — I890 Lymphedema, not elsewhere classified: Secondary | ICD-10-CM | POA: Insufficient documentation

## 2019-04-10 DIAGNOSIS — Z720 Tobacco use: Secondary | ICD-10-CM | POA: Insufficient documentation

## 2019-04-10 DIAGNOSIS — Z20828 Contact with and (suspected) exposure to other viral communicable diseases: Secondary | ICD-10-CM | POA: Diagnosis present

## 2019-04-10 DIAGNOSIS — L97812 Non-pressure chronic ulcer of other part of right lower leg with fat layer exposed: Secondary | ICD-10-CM | POA: Insufficient documentation

## 2019-04-10 DIAGNOSIS — L0390 Cellulitis, unspecified: Secondary | ICD-10-CM | POA: Diagnosis present

## 2019-04-10 DIAGNOSIS — I10 Essential (primary) hypertension: Secondary | ICD-10-CM | POA: Insufficient documentation

## 2019-04-10 DIAGNOSIS — Z09 Encounter for follow-up examination after completed treatment for conditions other than malignant neoplasm: Secondary | ICD-10-CM | POA: Insufficient documentation

## 2019-04-10 DIAGNOSIS — Z803 Family history of malignant neoplasm of breast: Secondary | ICD-10-CM

## 2019-04-10 DIAGNOSIS — E669 Obesity, unspecified: Secondary | ICD-10-CM | POA: Insufficient documentation

## 2019-04-10 DIAGNOSIS — J45909 Unspecified asthma, uncomplicated: Secondary | ICD-10-CM | POA: Insufficient documentation

## 2019-04-10 DIAGNOSIS — G4730 Sleep apnea, unspecified: Secondary | ICD-10-CM | POA: Insufficient documentation

## 2019-04-10 HISTORY — DX: Spinal stenosis, site unspecified: M48.00

## 2019-04-10 LAB — COMPREHENSIVE METABOLIC PANEL
ALT: 17 U/L (ref 0–44)
AST: 22 U/L (ref 15–41)
Albumin: 3.4 g/dL — ABNORMAL LOW (ref 3.5–5.0)
Alkaline Phosphatase: 107 U/L (ref 38–126)
Anion gap: 8 (ref 5–15)
BUN: 17 mg/dL (ref 8–23)
CO2: 26 mmol/L (ref 22–32)
Calcium: 8.7 mg/dL — ABNORMAL LOW (ref 8.9–10.3)
Chloride: 104 mmol/L (ref 98–111)
Creatinine, Ser: 0.89 mg/dL (ref 0.44–1.00)
GFR calc Af Amer: >60 mL/min (ref >60)
GFR calc non Af Amer: >60 mL/min (ref >60)
Glucose, Bld: 92 mg/dL (ref 70–99)
Potassium: 4.3 mmol/L (ref 3.5–5.1)
Sodium: 138 mmol/L (ref 135–145)
Total Bilirubin: 0.8 mg/dL (ref 0.3–1.2)
Total Protein: 8.3 g/dL — ABNORMAL HIGH (ref 6.5–8.1)

## 2019-04-10 LAB — CBC WITH DIFFERENTIAL/PLATELET
Abs Immature Granulocytes: 0.02 10*3/uL (ref 0.00–0.07)
Basophils Absolute: 0 10*3/uL (ref 0.0–0.1)
Basophils Relative: 0 %
Eosinophils Absolute: 0 10*3/uL (ref 0.0–0.5)
Eosinophils Relative: 0 %
HCT: 38.9 % (ref 36.0–46.0)
Hemoglobin: 11.8 g/dL — ABNORMAL LOW (ref 12.0–15.0)
Immature Granulocytes: 0 %
Lymphocytes Relative: 24 %
Lymphs Abs: 1.3 10*3/uL (ref 0.7–4.0)
MCH: 25.8 pg — ABNORMAL LOW (ref 26.0–34.0)
MCHC: 30.3 g/dL (ref 30.0–36.0)
MCV: 84.9 fL (ref 80.0–100.0)
Monocytes Absolute: 0.6 10*3/uL (ref 0.1–1.0)
Monocytes Relative: 11 %
Neutro Abs: 3.4 10*3/uL (ref 1.7–7.7)
Neutrophils Relative %: 65 %
Platelets: 186 10*3/uL (ref 150–400)
RBC: 4.58 MIL/uL (ref 3.87–5.11)
RDW: 14.6 % (ref 11.5–15.5)
WBC: 5.3 10*3/uL (ref 4.0–10.5)
nRBC: 0 % (ref 0.0–0.2)

## 2019-04-10 MED ORDER — SODIUM CHLORIDE 0.9 % IV SOLN
2.0000 g | Freq: Three times a day (TID) | INTRAVENOUS | Status: DC
  Administered 2019-04-10 – 2019-04-14 (×12): 2 g via INTRAVENOUS
  Filled 2019-04-10 (×14): qty 2

## 2019-04-10 MED ORDER — ACETAMINOPHEN 325 MG PO TABS
650.0000 mg | ORAL_TABLET | Freq: Four times a day (QID) | ORAL | Status: DC | PRN
  Administered 2019-04-10 – 2019-04-14 (×4): 650 mg via ORAL
  Filled 2019-04-10 (×4): qty 2

## 2019-04-10 MED ORDER — POLYETHYLENE GLYCOL 3350 17 G PO PACK: 17.0000 g | PACK | Freq: Every day | ORAL | Status: DC | PRN

## 2019-04-10 MED ORDER — BISACODYL 5 MG PO TBEC: 5.0000 mg | DELAYED_RELEASE_TABLET | Freq: Every day | ORAL | Status: DC | PRN

## 2019-04-10 MED ORDER — VANCOMYCIN HCL 10 G IV SOLR
2500.0000 mg | Freq: Once | INTRAVENOUS | Status: DC
  Filled 2019-04-10: qty 2500

## 2019-04-10 MED ORDER — ONDANSETRON HCL 4 MG/2ML IJ SOLN: 4.0000 mg | Freq: Four times a day (QID) | INTRAMUSCULAR | Status: DC | PRN

## 2019-04-10 MED ORDER — ACETAMINOPHEN 650 MG RE SUPP: 650.0000 mg | Freq: Four times a day (QID) | RECTAL | Status: DC | PRN

## 2019-04-10 MED ORDER — HEPARIN SODIUM (PORCINE) 5000 UNIT/ML IJ SOLN
5000.0000 [IU] | Freq: Three times a day (TID) | INTRAMUSCULAR | Status: DC
  Administered 2019-04-10 – 2019-04-14 (×11): 5000 [IU] via SUBCUTANEOUS
  Filled 2019-04-10 (×12): qty 1

## 2019-04-10 MED ORDER — ONDANSETRON HCL 4 MG PO TABS: 4.0000 mg | ORAL_TABLET | Freq: Four times a day (QID) | ORAL | Status: DC | PRN

## 2019-04-10 MED ORDER — INFLUENZA VAC SPLIT QUAD 0.5 ML IM SUSY
0.5000 mL | PREFILLED_SYRINGE | INTRAMUSCULAR | Status: AC
  Administered 2019-04-11: 0.5 mL via INTRAMUSCULAR
  Filled 2019-04-10: qty 0.5

## 2019-04-10 MED ORDER — LABETALOL HCL 5 MG/ML IV SOLN: 10.0000 mg | Freq: Four times a day (QID) | INTRAVENOUS | Status: DC | PRN

## 2019-04-10 MED ORDER — ATENOLOL 25 MG PO TABS
50.0000 mg | ORAL_TABLET | Freq: Every day | ORAL | Status: DC
  Administered 2019-04-10 – 2019-04-14 (×4): 50 mg via ORAL
  Filled 2019-04-10 (×5): qty 2

## 2019-04-10 MED ORDER — HYDROCODONE-ACETAMINOPHEN 5-325 MG PO TABS
1.0000 | ORAL_TABLET | ORAL | Status: DC | PRN
  Administered 2019-04-11: 1 via ORAL
  Administered 2019-04-12: 2 via ORAL
  Filled 2019-04-10: qty 2
  Filled 2019-04-10: qty 1

## 2019-04-10 NOTE — ED Notes (Signed)
.. ED TO INPATIENT HANDOFF REPORT  ED Nurse Name and Phone #: Deneise Lever [phone] Name/Age/Gender [name] 63 y.o. female Room/Bed: ED14A/ED14A  Code Status   Code Status: Prior  Home/SNF/Other Home Patient oriented to: self, place, time and situation Is this baseline? Yes   Triage Complete: Triage complete  Chief Complaint Cellultis LLE   Triage Note Patient presents with cellulitis not responding to treatment at wound care. Patient was at appointment this morning and was sent here. Reports left lower leg is oozing and will not heal.    Allergies Allergies  Allergen Reactions  . Ace Inhibitors Hives and Swelling  . Ibuprofen Hives and Swelling    Level of Care/Admitting Diagnosis ED Disposition    ED Disposition Condition [facility] Area: [place] [[id number]]  Level of Care: Med-Surg [16]  Covid Evaluation: N/A  Diagnosis: Cellulitis [id number]  Admitting Physician: Bettey Costa [id number]  Attending Physician: MODY, Ulice Bold [id number]  Estimated length of stay: 5 - 7 days  Certification:: I certify this patient will need inpatient services for at least 2 midnights  PT Class (Do Not Modify): Inpatient [101]  PT Acc Code (Do Not Modify): Private [1]       B Medical/Surgery History Past Medical History:  Diagnosis Date  . Arthritis   . Asthma   . Enlarged heart   . Hypertension   . Lymphedema   . Sleep apnea   . Spinal stenosis    Past Surgical History:  Procedure Laterality Date  . COLONOSCOPY WITH PROPOFOL N/A 03/12/2018   Procedure: COLONOSCOPY WITH PROPOFOL;  Surgeon: Manya Silvas, MD;  Location: [facility] ENDOSCOPY;  Service: Endoscopy;  Laterality: N/A;  . NO PAST SURGERIES       A IV Location/Drains/Wounds Patient Lines/Drains/Airways Status   Active Line/Drains/Airways    Name:   Placement date:   Placement time:   Site:   Days:   Peripheral IV 04/10/19 Right Antecubital   04/10/19    1052    Antecubital    less than 1          Intake/Output Last 24 hours No intake or output data in the 24 hours ending 04/10/19 1234  Labs/Imaging Results for orders placed or performed during the hospital encounter of 04/10/19 (from the past 48 hour(s))  CBC with Differential     Status: Abnormal   Collection Time: 04/10/19 10:44 AM  Result Value Ref Range   WBC 5.3 4.0 - 10.5 K/uL   RBC 4.58 3.87 - 5.11 MIL/uL   Hemoglobin 11.8 (L) 12.0 - 15.0 g/dL   HCT 38.9 36.0 - 46.0 %   MCV 84.9 80.0 - 100.0 fL   MCH 25.8 (L) 26.0 - 34.0 pg   MCHC 30.3 30.0 - 36.0 g/dL   RDW 14.6 11.5 - 15.5 %   Platelets 186 150 - 400 K/uL   nRBC 0.0 0.0 - 0.2 %   Neutrophils Relative % 65 %   Neutro Abs 3.4 1.7 - 7.7 K/uL   Lymphocytes Relative 24 %   Lymphs Abs 1.3 0.7 - 4.0 K/uL   Monocytes Relative 11 %   Monocytes Absolute 0.6 0.1 - 1.0 K/uL   Eosinophils Relative 0 %   Eosinophils Absolute 0.0 0.0 - 0.5 K/uL   Basophils Relative 0 %   Basophils Absolute 0.0 0.0 - 0.1 K/uL   Immature Granulocytes 0 %   Abs Immature Granulocytes 0.02 0.00 - 0.07 K/uL  Comment: Performed at [facility], [address] [place], [place] [address]  Comprehensive metabolic panel     Status: Abnormal   Collection Time: 04/10/19 10:44 AM  Result Value Ref Range   Sodium 138 135 - 145 mmol/L   Potassium 4.3 3.5 - 5.1 mmol/L   Chloride 104 98 - 111 mmol/L   CO2 26 22 - 32 mmol/L   Glucose, Bld 92 70 - 99 mg/dL   BUN 17 8 - 23 mg/dL   Creatinine, Ser 0.89 0.44 - 1.00 mg/dL   Calcium 8.7 (L) 8.9 - 10.3 mg/dL   Total Protein 8.3 (H) 6.5 - 8.1 g/dL   Albumin 3.4 (L) 3.5 - 5.0 g/dL   AST 22 15 - 41 U/L   ALT 17 0 - 44 U/L   Alkaline Phosphatase 107 38 - 126 U/L   Total Bilirubin 0.8 0.3 - 1.2 mg/dL   GFR calc non Af Amer >60 >60 mL/min   GFR calc Af Amer >60 >60 mL/min   Anion gap 8 5 - 15    Comment: Performed at [facility], [address] [place], [place] [address]   No results found.  Pending  Labs [employer] (From admission, onward)    Start     Ordered   04/10/19 1159  SARS CORONAVIRUS 2 (TAT 6-24 HRS) Nasopharyngeal Nasopharyngeal Swab  (Asymptomatic/Tier 2 Patients Labs)  Once,   STAT    Question Answer Comment  Is this test for diagnosis or screening Screening   Symptomatic for COVID-19 as defined by CDC No   Hospitalized for COVID-19 No   Admitted to ICU for COVID-19 No   Previously tested for COVID-19 No   Resident in a congregate (group) care setting No   Employed in healthcare setting No   Pregnant No      04/10/19 1159   Signed and Held  CBC  (heparin)  Once,   R    Comments: Baseline for heparin therapy IF NOT ALREADY DRAWN.  Notify MD if PLT < 100 K.    Signed and Held   Signed and Held  Creatinine, serum  (heparin)  Once,   R    Comments: Baseline for heparin therapy IF NOT ALREADY DRAWN.    Signed and Held          Vitals/Pain Today's Vitals   04/10/19 1145 04/10/19 1200 04/10/19 1215 04/10/19 1230  BP:    (!) 148/78  Pulse: 66 (!) 58 63 63  Resp:      Temp:      TempSrc:      SpO2: 100% 100% 100% 96%  Weight:      Height:      PainSc:        Isolation Precautions No active isolations  Medications Medications  ceFEPIme (MAXIPIME) 2 g in sodium chloride 0.9 % 100 mL IVPB (2 g Intravenous New Bag/Given 04/10/19 1223)    Mobility manual wheelchair Low fall risk   Focused Assessments skin   R Recommendations: See Admitting Provider Note  Report given to:   Additional Notes:

## 2019-04-10 NOTE — Consult Note (Signed)
PHARMACY -  BRIEF ANTIBIOTIC NOTE   Pharmacy has received consult(s) for Vancomycin from an ED provider.  The patient's profile has been reviewed for ht/wt/allergies/indication/available labs.    One time order(s) placed for Vancomycin 2500mg  x 1  Further antibiotics/pharmacy consults should be ordered by admitting physician if indicated.                       Thank you, Lu Duffel, PharmD, BCPS Clinical Pharmacist 04/10/2019 12:12 PM

## 2019-04-10 NOTE — ED Provider Notes (Signed)
[facility] Emergency Department Provider Note  ____________________________________________   First MD Initiated Contact with Patient 04/10/19 1157     (approximate)  I have reviewed the triage vital signs and the nursing notes.   HISTORY  Chief Complaint Recurrent Skin Infections    HPI [name] is a 63 y.o. female with obesity, lymphedema who presents with left leg redness and discharge.  Patient had a visit with wound care today and they noted increased discharge from her left leg.  There is also increased warmth over the leg.  She endorses always have a little bit of redness with her lymphedema.  She has been on ciprofloxacin for 2 weeks without any improvement.  She said that she had an admission back in May where she had failed outpatient clindamycin and was put on vanc and got better.  Patient was discharged on Bactrim and Cipro at that time.  The discharge has been moderate, constant, nothing makes it better, nothing makes it worse.  Denies any fevers.            Past Medical History:  Diagnosis Date   Arthritis    Asthma    Enlarged heart    Hypertension    Lymphedema    Sleep apnea    Spinal stenosis     Patient Active Problem List   Diagnosis Date Noted   Cellulitis of left lower extremity 11/03/2018   Sepsis ([place]) 04/17/2018   Lymphedema 08/04/2016   OSA (obstructive sleep apnea) 08/04/2016   HTN (hypertension) 08/04/2016   Asthma 08/04/2016   Cellulitis 01/24/2015   Cellulitis of right leg 01/23/2015    Past Surgical History:  Procedure Laterality Date   COLONOSCOPY WITH PROPOFOL N/A 03/12/2018   Procedure: COLONOSCOPY WITH PROPOFOL;  Surgeon: Manya Silvas, MD;  Location[facility]COPY;  Service: Endoscopy;  Laterality: N/A;   NO PAST SURGERIES      Prior to Admission medications   Medication Sig Start Date End Date Taking? Authorizing Provider  acetaminophen (TYLENOL) 500 MG tablet Take  1,000-1,500 mg by mouth every 6 (six) hours as needed for mild pain.     [provider]  albuterol (PROVENTIL HFA;VENTOLIN HFA) 108 (90 BASE) MCG/ACT inhaler Inhale 2 puffs into the lungs every 6 (six) hours as needed for wheezing or shortness of breath.    [provider]  amLODipine (NORVASC) 5 MG tablet Take 5 mg by mouth daily.     [provider]  atenolol (TENORMIN) 50 MG tablet Take 50 mg by mouth daily.    [provider]  cetirizine (ZYRTEC) 10 MG tablet Take 10 mg by mouth daily as needed for allergies.    [provider]  lidocaine (LIDODERM) 5 % Place 1 patch onto the skin every 12 (twelve) hours. Remove & Discard patch within 12 hours or as directed by MD 03/25/19 03/24/20  Blake Divine, MD  NYSTATIN powder Apply 1 application topically 2 (two) times daily. 03/23/19   [provider]  sulfamethoxazole-trimethoprim (BACTRIM DS) 800-160 MG tablet Take 1 tablet by mouth 2 (two) times daily. 11/05/18   Bettey Costa, MD    Allergies Ace inhibitors and Ibuprofen  Family History  Problem Relation Age of Onset   Thyroid disease Other    Breast cancer Maternal Aunt     Social History Social History   Tobacco Use   Smoking status: Former Smoker   Smokeless tobacco: Current User    Types: Snuff  Substance Use Topics  Alcohol use: No   Drug use: No      Review of Systems Constitutional: No fever/chills Eyes: No visual changes. ENT: No sore throat. Cardiovascular: Denies chest pain. Respiratory: Denies shortness of breath. Gastrointestinal: No abdominal pain.  No nausea, no vomiting.  No diarrhea.  No constipation. Genitourinary: Negative for dysuria. Musculoskeletal: Negative for back pain. + leg swelling and redness/discharge  Skin: Negative for rash. Neurological: Negative for headaches, focal weakness or numbness. All other ROS negative ____________________________________________   PHYSICAL EXAM:  VITAL  SIGNS: ED Triage Vitals  Enc Vitals Group     BP 04/10/19 1043 (!) 152/76     Pulse Rate 04/10/19 1043 64     Resp 04/10/19 1043 20     Temp 04/10/19 1043 98.5 F (36.9 C)     Temp Source 04/10/19 1043 Oral     SpO2 04/10/19 1043 100 %     Weight 04/10/19 1043 (!) 390 lb (176.9 kg)     Height 04/10/19 1043 5' 3.5" (1.613 m)     Head Circumference --      Peak Flow --      Pain Score 04/10/19 1049 5     Pain Loc --      Pain Edu? --      Excl. in [place]? --     Constitutional: Alert and oriented.obese female. Eyes: Conjunctivae are normal. EOMI. Head: Atraumatic. Nose: No congestion/rhinnorhea. Mouth/Throat: Mucous membranes are moist.   Neck: No stridor. Trachea Midline. FROM Cardiovascular: Normal rate, regular rhythm. Grossly normal heart sounds.  Good peripheral circulation. Respiratory: Normal respiratory effort.  No retractions. Lungs CTAB. Gastrointestinal: Soft and nontender. No distention. No abdominal bruits.  Musculoskeletal: Severe lymphedema with redness bilaterally.  Good distal pulse.  Does have some discharge off the left lower leg with increased warmth. Neurologic:  Normal speech and language. No gross focal neurologic deficits are appreciated.  Skin:  Skin is warm, dry and intact. No rash noted. Psychiatric: Mood and affect are normal. Speech and behavior are normal. GU: Deferred   ____________________________________________   LABS (all labs ordered are listed, but only abnormal results are displayed)  Labs Reviewed  CBC WITH DIFFERENTIAL/PLATELET - Abnormal; Notable for the following components:      Result Value   Hemoglobin 11.8 (*)    MCH 25.8 (*)    All other components within normal limits  COMPREHENSIVE METABOLIC PANEL - Abnormal; Notable for the following components:   Calcium 8.7 (*)    Total Protein 8.3 (*)    Albumin 3.4 (*)    All other components within normal limits  SARS CORONAVIRUS 2 (TAT 6-24 HRS)    ____________________________________________    PROCEDURES  Procedure(s) performed (including Critical Care):  Procedures   ____________________________________________   INITIAL IMPRESSION / ASSESSMENT AND PLAN / ED COURSE  [name] was evaluated in Emergency Department on 04/10/2019 for the symptoms described in the history of present illness. She was evaluated in the context of the global COVID-19 pandemic, which necessitated consideration that the patient might be at risk for infection with the SARS-CoV-2 virus that causes COVID-19. Institutional protocols and algorithms that pertain to the evaluation of patients at risk for COVID-19 are in a state of rapid change based on information released by regulatory bodies including the CDC and federal and state organizations. These policies and algorithms were followed during the patient's care in the ED.     Patient is a 63 year old with lymphedema who presents with left leg redness  and erythema and discharge.  Patient previously improved on IV vanc.  Patient has obvious chronic venous stasis and lymphedema but given the increasing warmth and the discharge will start her on IV vancomycin.  Patient has good distal pulses lower suspicion for arterial occlusion.  GEN med requested repeat Dopplers to evaluate for DVT.  No recent falls to suggest fracture.  Patient does not meet sirs criteria and low suspicion for bacteremia.  It is to the hospital team for admission for cellulitis.      ____________________________________________   FINAL CLINICAL IMPRESSION(S) / ED DIAGNOSES   Final diagnoses:  Cellulitis of left lower extremity      MEDICATIONS GIVEN DURING THIS VISIT:  Medications - No data to display   ED Discharge Orders    None       Note:  This document was prepared using Dragon voice recognition software and may include unintentional dictation errors.   Vanessa [place], MD 04/10/19 [phone]

## 2019-04-10 NOTE — Consult Note (Signed)
Pharmacy Antibiotic Note  [name] is a 63 y.o. female admitted on 04/10/2019 with wound infection.  Pharmacy has been consulted for Cefepime dosing.  Plan: Will dose Cefepime 2g q8h  Height: 5' 3.5" (161.3 cm) Weight: (!) 390 lb (176.9 kg) IBW/kg (Calculated) : 53.55  Temp (24hrs), Avg:98.5 F (36.9 C), Min:98.5 F (36.9 C), Max:98.5 F (36.9 C)  Recent Labs  Lab 04/10/19 1044  WBC 5.3  CREATININE 0.89    Estimated Creatinine Clearance: 105.1 mL/min (by C-G formula based on SCr of 0.89 mg/dL).    Allergies  Allergen Reactions  . Ace Inhibitors Hives and Swelling  . Ibuprofen Hives and Swelling    Antimicrobials this admission: Cefepime 10/2 >>  Dose adjustments this admission: None  Microbiology results: COVID pending  Thank you for allowing pharmacy to be a part of this patient's care.  Lu Duffel, PharmD, BCPS Clinical Pharmacist 04/10/2019 1:10 PM

## 2019-04-10 NOTE — Progress Notes (Signed)
[name] ([id number]) Visit Report for 04/10/2019 Arrival Information Details Patient Name: [name], [name]. Date of Service: 04/10/2019 9:15 AM Medical Record Number: [id number] Patient Account Number: [id number] Date of Birth/Sex: 03/18/1956 (63 y.o. F) Treating RN: Army Melia Primary Care Cristy Colmenares: Tandy Gaw Other Clinician: Referring Ladonne Sharples: Tandy Gaw Treating Makella Buckingham/Extender: STONE III, HOYT Weeks in Treatment: 7 Visit Information History Since Last Visit Added or deleted any medications: No Patient Arrived: Cane Any new allergies or adverse reactions: No Arrival Time: 09:28 Had a fall or experienced change in No Accompanied By: self activities of daily living that may affect Transfer Assistance: None risk of falls: Patient Requires Transmission-Based Precautions: No Signs or symptoms of abuse/neglect since last visito No Patient Has Alerts: No Hospitalized since last visit: No Has Dressing in Place as Prescribed: Yes Pain Present Now: No Electronic Signature(s) Signed: 04/10/2019 1:24:16 PM By: Army Melia Entered By: Army Melia on 04/10/2019 09:28:42 [name], [name] ([id number]) -------------------------------------------------------------------------------- Clinic Level of Care Assessment Details Patient Name: [name]. Date of Service: 04/10/2019 9:15 AM Medical Record Number: [id number] Patient Account Number: [id number] Date of Birth/Sex: June 20, 1956 (63 y.o. F) Treating RN: Montey Hora Primary Care Shianna Bally: Tandy Gaw Other Clinician: Referring Sterling Ucci: Tandy Gaw Treating Mayfield Schoene/Extender: STONE III, HOYT Weeks in Treatment: 7 Clinic Level of Care Assessment Items TOOL 4 Quantity Score []  - Use when only an EandM is performed on FOLLOW-UP visit 0 ASSESSMENTS - Nursing Assessment / Reassessment X - Reassessment of Co-morbidities (includes updates in patient status) 1 10 X- 1 5 Reassessment of Adherence to Treatment  Plan ASSESSMENTS - Wound and Skin Assessment / Reassessment X - Simple Wound Assessment / Reassessment - one wound 1 5 []  - 0 Complex Wound Assessment / Reassessment - multiple wounds []  - 0 Dermatologic / Skin Assessment (not related to wound area) ASSESSMENTS - Focused Assessment X - Circumferential Edema Measurements - multi extremities 1 5 []  - 0 Nutritional Assessment / Counseling / Intervention X- 1 5 Lower Extremity Assessment (monofilament, tuning fork, pulses) []  - 0 Peripheral Arterial Disease Assessment (using hand held doppler) ASSESSMENTS - Ostomy and/or Continence Assessment and Care []  - Incontinence Assessment and Management 0 []  - 0 Ostomy Care Assessment and Management (repouching, etc.) PROCESS - Coordination of Care X - Simple Patient / Family Education for ongoing care 1 15 []  - 0 Complex (extensive) Patient / Family Education for ongoing care X- 1 10 Staff obtains Programmer, systems, Records, Test Results / Process Orders []  - 0 Staff telephones HHA, Nursing Homes / Clarify orders / etc []  - 0 Routine Transfer to another Facility (non-emergent condition) []  - 0 Routine Hospital Admission (non-emergent condition) []  - 0 New Admissions / Biomedical engineer / Ordering NPWT, Apligraf, etc. []  - 0 Emergency Hospital Admission (emergent condition) X- 1 10 Simple Discharge Coordination [name], [name]. ([id number]) []  - 0 Complex (extensive) Discharge Coordination PROCESS - Special Needs []  - Pediatric / Minor Patient Management 0 []  - 0 Isolation Patient Management []  - 0 Hearing / Language / Visual special needs []  - 0 Assessment of Community assistance (transportation, D/C planning, etc.) []  - 0 Additional assistance / Altered mentation []  - 0 Support Surface(s) Assessment (bed, cushion, seat, etc.) INTERVENTIONS - Wound Cleansing / Measurement X - Simple Wound Cleansing - one wound 1 5 []  - 0 Complex Wound Cleansing - multiple wounds X- 1  5 Wound Imaging (photographs - any number of wounds) []  - 0 Wound Tracing (instead of photographs) X- 1 5 Simple  Wound Measurement - one wound []  - 0 Complex Wound Measurement - multiple wounds INTERVENTIONS - Wound Dressings []  - Small Wound Dressing one or multiple wounds 0 X- 1 15 Medium Wound Dressing one or multiple wounds []  - 0 Large Wound Dressing one or multiple wounds []  - 0 Application of Medications - topical []  - 0 Application of Medications - injection INTERVENTIONS - Miscellaneous []  - External ear exam 0 []  - 0 Specimen Collection (cultures, biopsies, blood, body fluids, etc.) []  - 0 Specimen(s) / Culture(s) sent or taken to Lab for analysis []  - 0 Patient Transfer (multiple staff / Civil Service fast streamer / Similar devices) []  - 0 Simple Staple / Suture removal (25 or less) []  - 0 Complex Staple / Suture removal (26 or more) []  - 0 Hypo / Hyperglycemic Management (close monitor of Blood Glucose) []  - 0 Ankle / Brachial Index (ABI) - do not check if billed separately X- 1 5 Vital Signs [name], [name] ([id number]) Has the patient been seen at the hospital within the last three years: Yes Total Score: 100 Level Of Care: New/Established - Level 3 Electronic Signature(s) Signed: 04/10/2019 4:39:44 PM By: Montey Hora Entered By: Montey Hora on 04/10/2019 09:42:55 [name], [name] ([id number]) -------------------------------------------------------------------------------- Encounter Discharge Information Details Patient Name: [name]. Date of Service: 04/10/2019 9:15 AM Medical Record Number: [id number] Patient Account Number: [id number] Date of Birth/Sex: 08-11-1955 (63 y.o. F) Treating RN: Montey Hora Primary Care Gevorg Brum: Tandy Gaw Other Clinician: Referring Zoeie Ritter: Tandy Gaw Treating Loxley Schmale/Extender: Melburn Hake, HOYT Weeks in Treatment: 7 Encounter Discharge Information Items Discharge Condition: Stable Ambulatory Status:  Cane Discharge Destination: Emergency Room Telephoned: Yes Spoke With: triage nurse Orders Sent: No Transportation: Private Auto Accompanied By: self Schedule Follow-up Appointment: Yes Clinical Summary of Care: Electronic Signature(s) Signed: 04/10/2019 4:39:44 PM By: Montey Hora Entered By: Montey Hora on 04/10/2019 09:44:04 [name], [name] ([id number]) -------------------------------------------------------------------------------- Lower Extremity Assessment Details Patient Name: [name]. Date of Service: 04/10/2019 9:15 AM Medical Record Number: [id number] Patient Account Number: [id number] Date of Birth/Sex: [birth year] (63 y.o. F) Treating RN: Army Melia Primary Care Nijee Heatwole: Tandy Gaw Other Clinician: Referring Alannis Hsia: Tandy Gaw Treating Taijuan Serviss/Extender: STONE III, HOYT Weeks in Treatment: 7 Edema Assessment Assessed: [Left: No] [Right: No] Edema: [Left: No] [Right: No] Vascular Assessment Pulses: Dorsalis Pedis Palpable: [Left:Yes] [Right:Yes] Electronic Signature(s) Signed: 04/10/2019 1:24:16 PM By: Army Melia Entered By: Army Melia on 04/10/2019 09:34:38 [name], [name] ([id number]) -------------------------------------------------------------------------------- Multi Wound Chart Details Patient Name: Elizabeth Landau C. Date of Service: 04/10/2019 9:15 AM Medical Record Number: [id number] Patient Account Number: [id number] Date of Birth/Sex: 1956/03/02 (63 y.o. F) Treating RN: Montey Hora Primary Care Ramonte Mena: Tandy Gaw Other Clinician: Referring Christyne Mccain: Tandy Gaw Treating Gelene Recktenwald/Extender: STONE III, HOYT Weeks in Treatment: 7 Vital Signs Height(in): 62 Pulse(bpm): 65 Weight(lbs): 390 Blood Pressure(mmHg): 144/68 Body Mass Index(BMI): 71 Temperature(F): 99.4 Respiratory Rate 16 (breaths/min): Photos: [N/A:N/A] Wound Location: Left Lower Leg - Medial N/A N/A Wounding Event: Blister N/A N/A Primary  Etiology: Lymphedema N/A N/A Comorbid History: Cataracts, Lymphedema, N/A N/A Asthma, Sleep Apnea, Tuberculosis, Hypertension, Gout, Osteoarthritis Date Acquired: 10/08/2018 N/A N/A Weeks of Treatment: 7 N/A N/A Wound Status: Open N/A N/A Measurements L x W x D 16x31x0.1 N/A N/A (cm) Area (cm) : 389.557 N/A N/A Volume (cm) : 38.956 N/A N/A % Reduction in Area: -1212.20% N/A N/A % Reduction in Volume: -1212.10% N/A N/A Classification: Full Thickness Without N/A N/A Exposed Support Structures Exudate Amount: Large N/A N/A Exudate Type: Serosanguineous  N/A N/A Exudate Color: red, brown N/A N/A Granulation Amount: Medium (34-66%) N/A N/A Granulation Quality: Red N/A N/A Necrotic Amount: Medium (34-66%) N/A N/A Exposed Structures: Fat Layer (Subcutaneous N/A N/A Tissue) Exposed: Yes Fascia: No Tendon: No Muscle: No [name], [name] ([id number]) Joint: No Bone: No Epithelialization: None N/A N/A Treatment Notes Electronic Signature(s) Signed: 04/10/2019 4:39:44 PM By: Montey Hora Entered By: Montey Hora on 04/10/2019 09:41:34 [name], [name] ([id number]) -------------------------------------------------------------------------------- [place] Details Patient Name: [name]. Date of Service: 04/10/2019 9:15 AM Medical Record Number: [id number] Patient Account Number: [id number] Date of Birth/Sex: June 13, 1956 (63 y.o. F) Treating RN: Montey Hora Primary Care Emory Leaver: Tandy Gaw Other Clinician: Referring Chirstina Haan: Tandy Gaw Treating Tylicia Sherman/Extender: STONE III, HOYT Weeks in Treatment: 7 Active Inactive Abuse / Safety / Falls / Self Care Management Nursing Diagnoses: Potential for falls Goals: Patient will remain injury free related to falls Date Initiated: 02/20/2019 Target Resolution Date: 05/16/2019 Goal Status: Active Interventions: Assess fall risk on admission and as needed Notes: Orientation to the Wound Care  Program Nursing Diagnoses: Knowledge deficit related to the wound healing center program Goals: Patient/caregiver will verbalize understanding of the [place] Program Date Initiated: 02/20/2019 Target Resolution Date: 05/16/2019 Goal Status: Active Interventions: Provide education on orientation to the wound center Notes: Venous Leg Ulcer Nursing Diagnoses: Actual venous Insuffiency (use after diagnosis is confirmed) Goals: Patient will maintain optimal edema control Date Initiated: 02/20/2019 Target Resolution Date: 05/16/2019 Goal Status: Active Interventions: Assess peripheral edema status every visit. ALBERTA, SCHAFFERT ([id number]) Compression as ordered Notes: Wound/Skin Impairment Nursing Diagnoses: Impaired tissue integrity Goals: Ulcer/skin breakdown will heal within 14 weeks Date Initiated: 02/20/2019 Target Resolution Date: 05/16/2019 Goal Status: Active Interventions: Assess patient/caregiver ability to obtain necessary supplies Assess patient/caregiver ability to perform ulcer/skin care regimen upon admission and as needed Assess ulceration(s) every visit Notes: Electronic Signature(s) Signed: 04/10/2019 4:39:44 PM By: Montey Hora Entered By: Montey Hora on 04/10/2019 09:40:15 [name], [name] ([id number]) -------------------------------------------------------------------------------- Pain Assessment Details Patient Name: [name]. Date of Service: 04/10/2019 9:15 AM Medical Record Number: [id number] Patient Account Number: [id number] Date of Birth/Sex: 02/08/1956 (63 y.o. F) Treating RN: Army Melia Primary Care Grayling Schranz: Tandy Gaw Other Clinician: Referring Margrette Wynia: Tandy Gaw Treating Jasiah Buntin/Extender: STONE III, HOYT Weeks in Treatment: 7 Active Problems Location of Pain Severity and Description of Pain Patient Has Paino No Site Locations Pain Management and Medication Current Pain Management: Electronic  Signature(s) Signed: 04/10/2019 1:24:16 PM By: Army Melia Entered By: Army Melia on 04/10/2019 09:28:52 Menzer, [name] ([id number]) -------------------------------------------------------------------------------- Patient/Caregiver Education Details Patient Name: [name]. Date of Service: 04/10/2019 9:15 AM Medical Record Number: [id number] Patient Account Number: [id number] Date of Birth/Gender: 1955/11/02 (63 y.o. F) Treating RN: Montey Hora Primary Care Physician: Tandy Gaw Other Clinician: Referring Physician: Tandy Gaw Treating Physician/Extender: Melburn Hake, HOYT Weeks in Treatment: 7 Education Assessment Education Provided To: Patient Education Topics Provided Wound/Skin Impairment: Handouts: Other: wound care as ordered Methods: Explain/Verbal Responses: State content correctly Electronic Signature(s) Signed: 04/10/2019 4:39:44 PM By: Montey Hora Entered By: Montey Hora on 04/10/2019 09:43:13 Nees, [name] ([id number]) -------------------------------------------------------------------------------- Wound Assessment Details Patient Name: Elizabeth Landau C. Date of Service: 04/10/2019 9:15 AM Medical Record Number: [id number] Patient Account Number: [id number] Date of Birth/Sex: 1955-11-18 (63 y.o. F) Treating RN: Army Melia Primary Care Westlee Devita: Tandy Gaw Other Clinician: Referring Emit Kuenzel: Tandy Gaw Treating Verna Hamon/Extender: STONE III, HOYT Weeks in Treatment: 7 Wound Status Wound Number: 3 Primary Lymphedema Etiology: Wound Location: Left  Lower Leg - Medial Wound Open Wounding Event: Blister Status: Date Acquired: 10/08/2018 Comorbid Cataracts, Lymphedema, Asthma, Sleep Weeks Of Treatment: 7 History: Apnea, Tuberculosis, Hypertension, Gout, Clustered Wound: No Osteoarthritis Photos Wound Measurements Length: (cm) 16 Width: (cm) 31 Depth: (cm) 0.1 Area: (cm) 389.557 Volume: (cm) 38.956 % Reduction in Area:  -1212.2% % Reduction in Volume: -1212.1% Epithelialization: None Tunneling: No Undermining: No Wound Description Full Thickness Without Exposed Support Foul Classification: Structures Slou Exudate Large Amount: Exudate Type: Serosanguineous Exudate Color: red, brown Odor After Cleansing: No gh/Fibrino Yes Wound Bed Granulation Amount: Medium (34-66%) Exposed Structure Granulation Quality: Red Fascia Exposed: No Necrotic Amount: Medium (34-66%) Fat Layer (Subcutaneous Tissue) Exposed: Yes Necrotic Quality: Adherent Slough Tendon Exposed: No Muscle Exposed: No Joint Exposed: No Bone Exposed: No Crimi, Joelys C. ([id number]) Treatment Notes Wound #3 (Left, Medial Lower Leg) Notes abd pads and kerlix today in clinic Electronic Signature(s) Signed: 04/10/2019 1:24:16 PM By: Army Melia Entered By: Army Melia on 04/10/2019 09:34:06 Yero, [name] ([id number]) -------------------------------------------------------------------------------- Vitals Details Patient Name: [name]. Date of Service: 04/10/2019 9:15 AM Medical Record Number: [id number] Patient Account Number: [id number] Date of Birth/Sex: 03-19-1956 (63 y.o. F) Treating RN: Army Melia Primary Care Keoni Risinger: Tandy Gaw Other Clinician: Referring Clemence [name]: Tandy Gaw Treating Davian Wollenberg/Extender: STONE III, HOYT Weeks in Treatment: 7 Vital Signs Time Taken: 09:28 Temperature (F): 99.4 Height (in): 62 Pulse (bpm): 65 Weight (lbs): 390 Respiratory Rate (breaths/min): 16 Body Mass Index (BMI): 71.3 Blood Pressure (mmHg): 144/68 Reference Range: 80 - 120 mg / dl Electronic Signature(s) Signed: 04/10/2019 1:24:16 PM By: Army Melia Entered By: Army Melia on 04/10/2019 09:29:59

## 2019-04-10 NOTE — H&P (Signed)
[facility] NAME: [name]    MR#:  [id number]  DATE OF BIRTH:  1955-08-28  DATE OF ADMISSION:  04/10/2019  PRIMARY CARE PHYSICIAN: Ranae Plumber, [place]   REQUESTING/REFERRING PHYSICIAN: dr Nickolas Madrid  CHIEF COMPLAINT:   celluliits HISTORY OF PRESENT ILLNESS:  [name]  is a 63 y.o. female with a known history of morbid obesity, chronic lymphedema evaluated by the wound clinic who presents from wound clinic due to left leg redness and drainage.  Patient went to the wound care clinic today and they noticed increased discharge from her leg so she was sent to the ER for further evaluation. She has been on ciprofloxacin for the past 2 weeks.  Her cultures about 2 weeks ago were positive for Klebsiella and Pseudomonas.  Ciprofloxacin was sensitive to this.  Patient was here earlier during this year for similar cellulitis.    PAST MEDICAL HISTORY:   Past Medical History:  Diagnosis Date  . Arthritis   . Asthma   . Enlarged heart   . Hypertension   . Lymphedema   . Sleep apnea   . Spinal stenosis     PAST SURGICAL HISTORY:   Past Surgical History:  Procedure Laterality Date  . COLONOSCOPY WITH PROPOFOL N/A 03/12/2018   Procedure: COLONOSCOPY WITH PROPOFOL;  Surgeon: Manya Silvas, MD;  Location: [facility];  Service: Endoscopy;  Laterality: N/A;  . NO PAST SURGERIES      SOCIAL HISTORY:   Social History   Tobacco Use  . Smoking status: Former Research scientist (life sciences)  . Smokeless tobacco: Current User    Types: Snuff  Substance Use Topics  . Alcohol use: No    FAMILY HISTORY:   Family History  Problem Relation Age of Onset  . Thyroid disease Other   . Breast cancer Maternal Aunt     DRUG ALLERGIES:   Allergies  Allergen Reactions  . Ace Inhibitors Hives and Swelling  . Ibuprofen Hives and Swelling    REVIEW OF SYSTEMS:   Review of Systems  Constitutional: Negative.  Negative for chills, fever and  malaise/fatigue.  HENT: Negative.  Negative for ear discharge, ear pain, hearing loss, nosebleeds and sore throat.   Eyes: Negative.  Negative for blurred vision and pain.  Respiratory: Negative.  Negative for cough, hemoptysis, shortness of breath and wheezing.   Cardiovascular: Negative.  Negative for chest pain, palpitations and leg swelling.  Gastrointestinal: Negative.  Negative for abdominal pain, blood in stool, diarrhea, nausea and vomiting.  Genitourinary: Negative.  Negative for dysuria.  Musculoskeletal: Negative.  Negative for back pain.  Skin:       Cellulitis  Neurological: Negative for dizziness, tremors, speech change, focal weakness, seizures and headaches.  Endo/Heme/Allergies: Negative.  Does not bruise/bleed easily.  Psychiatric/Behavioral: Negative.  Negative for depression, hallucinations and suicidal ideas.    MEDICATIONS AT HOME:   Prior to Admission medications   Medication Sig Start Date End Date Taking? Authorizing Provider  acetaminophen (TYLENOL) 500 MG tablet Take 1,000-1,500 mg by mouth every 6 (six) hours as needed for mild pain.     [provider]  albuterol (PROVENTIL HFA;VENTOLIN HFA) 108 (90 BASE) MCG/ACT inhaler Inhale 2 puffs into the lungs every 6 (six) hours as needed for wheezing or shortness of breath.    [provider]  amLODipine (NORVASC) 5 MG tablet Take 5 mg by mouth daily.     [provider]  atenolol (TENORMIN) 50 MG tablet Take  50 mg by mouth daily.    [provider]  cetirizine (ZYRTEC) 10 MG tablet Take 10 mg by mouth daily as needed for allergies.    [provider]  lidocaine (LIDODERM) 5 % Place 1 patch onto the skin every 12 (twelve) hours. Remove & Discard patch within 12 hours or as directed by MD 03/25/19 03/24/20  Blake Divine, MD  NYSTATIN powder Apply 1 application topically 2 (two) times daily. 03/23/19   [provider]  sulfamethoxazole-trimethoprim (BACTRIM DS) 800-160  MG tablet Take 1 tablet by mouth 2 (two) times daily. Patient not taking: Reported on 04/10/2019 11/05/18   Bettey Costa, MD      VITAL SIGNS:  Blood pressure (!) 152/76, pulse 64, temperature 98.5 F (36.9 C), temperature source Oral, resp. rate 20, height 5' 3.5" (1.613 m), weight (!) 176.9 kg, SpO2 100 %.  PHYSICAL EXAMINATION:   Physical Exam Constitutional:      General: She is not in acute distress.    Appearance: She is diaphoretic. She is not ill-appearing or toxic-appearing.     Comments: Morbid obesity  HENT:     Head: Normocephalic.  Eyes:     General: No scleral icterus. Neck:     Musculoskeletal: Normal range of motion and neck supple.     Vascular: No JVD.     Trachea: No tracheal deviation.  Cardiovascular:     Rate and Rhythm: Normal rate and regular rhythm.     Heart sounds: Normal heart sounds. No murmur. No friction rub. No gallop.   Pulmonary:     Effort: Pulmonary effort is normal. No respiratory distress.     Breath sounds: Normal breath sounds. No wheezing or rales.  Chest:     Chest wall: No tenderness.  Abdominal:     General: Bowel sounds are normal. There is no distension.     Palpations: Abdomen is soft. There is no mass.     Tenderness: There is no abdominal tenderness. There is no guarding or rebound.  Musculoskeletal: Normal range of motion.  Skin:    General: Skin is warm.     Findings: Erythema and rash present.     Comments: Severe lymphedema with redness bilaterally.   Does have some discharge off the left lower leg with increased warmth  Neurological:     General: No focal deficit present.     Mental Status: She is alert and oriented to person, place, and time.  Psychiatric:        Mood and Affect: Mood normal.        Behavior: Behavior normal.        Thought Content: Thought content normal.        Judgment: Judgment normal.       LABORATORY PANEL:   CBC Recent Labs  Lab 04/10/19 1044  WBC 5.3  HGB 11.8*  HCT 38.9  PLT  186   ------------------------------------------------------------------------------------------------------------------  Chemistries  Recent Labs  Lab 04/10/19 1044  NA 138  K 4.3  CL 104  CO2 26  GLUCOSE 92  BUN 17  CREATININE 0.89  CALCIUM 8.7*  AST 22  ALT 17  ALKPHOS 107  BILITOT 0.8   ------------------------------------------------------------------------------------------------------------------  Cardiac Enzymes No results for input(s): TROPONINI in the last 168 hours. ------------------------------------------------------------------------------------------------------------------  RADIOLOGY:  No results found.  EKG:   Orders placed or performed during the hospital encounter of 03/25/19  . EKG 12-Lead  . EKG 12-Lead  . EKG    IMPRESSION AND PLAN:  63 year old female with morbid obesity and chronic lymphedema seen at the wound care clinic today who presents from the clinic due to increased erythema of the left leg and worsening drainage with recent cultures positive for Pseudomonas and Klebsiella.  1.  Left lower extremity cellulitis on top of chronic lymphedema and morbid obesity: Previous cultures were positive for Pseudomonas and Klebsiella Start cefepime as per sensitivities on wound culture Elevate leg Consider wound care consult Lower extremity Doppler pending  2: CPAP If tolerated  3.  Chronic lower extremity lymphedema Will need frequent wrapping Continue atenolol   4.  Morbid obesity: Encouraged weight loss as tolerated    All the records are reviewed and case discussed with ED provider. Management plans discussed with the patient and she is in agreement  CODE STATUS: FULL  TOTAL TIME TAKING CARE OF THIS PATIENT: 41 minutes.    Bettey Costa M.D on 04/10/2019 at 12:08 PM  Between 7am to 6pm - Pager - [phone]  After 6pm go to [email or web address] - password [email or web address] [facility]  Office   [phone]  CC: Primary care physician; Ranae Plumber, [place]

## 2019-04-10 NOTE — ED Notes (Signed)
This RN and Anderson Malta, RN placed an external catheter on pt.

## 2019-04-10 NOTE — ED Triage Notes (Signed)
Patient presents with cellulitis not responding to treatment at wound care. Patient was at appointment this morning and was sent here. Reports left lower leg is oozing and will not heal.

## 2019-04-10 NOTE — Progress Notes (Addendum)
[name], [name] ([id number]) Visit Report for 04/10/2019 Chief Complaint Document Details Patient Name: [name], [name]. Date of Service: 04/10/2019 9:15 AM Medical Record Number: [id number] Patient Account Number: [id number] Date of Birth/Sex: 1956-01-09 (63 y.o. F) Treating RN: Montey Hora Primary Care Provider: Tandy [name] Other Clinician: Referring Provider: Tandy [name] Treating Provider/Extender: Melburn Hake, Dez Stauffer Weeks in Treatment: 7 Information Obtained from: Patient Chief Complaint Left LE Ulcer Electronic Signature(s) Signed: 04/10/2019 9:35:34 AM By: Worthy Keeler PA-C Entered By: Worthy Keeler on 04/10/2019 09:35:34 [name], [name] ([id number]) -------------------------------------------------------------------------------- HPI Details Patient Name: [name]. Date of Service: 04/10/2019 9:15 AM Medical Record Number: [id number] Patient Account Number: [id number] Date of Birth/Sex: 1955-08-26 (63 y.o. F) Treating RN: Montey Hora Primary Care Provider: Tandy [name] Other Clinician: Referring Provider: Tandy [name] Treating Provider/Extender: [name], Hilbert Briggs Weeks in Treatment: 7 History of Present Illness HPI Description: The patient is a 63 year old female with history of hypertension and a long-standing history of bilateral lower extremity lymphedema (first presented on 4/2) . She has had open ulcers in the past which have always responded to compression therapy. She had briefly been to a lymphedema clinic in the past which helped her at the time. this time around she stopped treatment of her lymphedema pumps approximately 2 weeks ago because of some pain in the knees and then noticed the right leg getting worse. She was seen by her PCP who put her on clindamycin 4 times a day 2 days ago. The patient has seen [facility] and Dr. Delana Meyer had seen her last year where a vascular study including venous and arterial duplex studies were within normal limits. he  had recommended compression stockings and lymphedema pumps and the patient has been using this in about 2 weeks ago. She is known to be diabetic but in the past few time she's gone to her primary care doctor her hemoglobin A1c has been normal. 02/11/2015 - after her last visit she took my advice and went to the ER regarding the progressive cellulitis of her right lower extremity and she was admitted between July 17 and 22nd. She received IV antibiotics and then was sent home on a course of steroid-induced and oral antibiotics. She has improved much since then. 02/17/2015 -- she has been doing fine and the weeping of her legs has remarkably gone down. She has no fresh issues. READMISSION 01/15/18 This patient was given this clinic before most recently in 2016 seen by Dr. Con Memos. She has massive bilateral lymphedema and over the last 2 months this had weeping edema out of the left leg. She has compression pumps but her compliance with these has been minimal. She has [facility] they've been using TCA/ABDs/kerlix under an Ace wrap.she has had recent problems with cellulitis. She was apparently seen in the ER and 12/23/17 and given clindamycin. She was then followed by her primary doctor and given doxycycline and Keflex. The pain seems to have settled down. In April 2018 the patient had arterial studies done at [facility]. This showed triphasic waveforms throughout the right leg and mostly triphasic waveforms on the left except for monophasic at the posterior tibial artery distally. She was not felt to have evidence of right lower extremity arterial stenosis or significant problems on the left side. 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 She also had a right lower extremity venous Doppler in January 2018 this was limited by the patient's body habitus and lymphedema. Most of the  proximal veins were not visualized The patient presents with an area of  denuded skin on the anterior medial part of the left calf. There is weeping edema fluid here. 01/22/18; the patient has somewhat better edema control using her compression pumps twice a day and as a result she has much better epithelialization on the left anterior calf area. Only a small open area remains. 01/29/18; the patient has been compliant with her compression pumps. Both the areas on her calf that healed. The remaining area on the left anterior leg is fully epithelialized Readmission: 02/20/2019 upon evaluation today patient presents for reevaluation due to issues that she is having with the bilateral lower extremities. She actually has wounds open on both legs. On the right she has an area in the crease of her leg on the right around the knee region which is actually draining quite a bit and actually has some fungal type appearance to it. She has been on nystatin powder that seems to have helped to some degree. In regard to the left lower extremity this is actually in the [place]. ([id number]) lower portion of her leg closer to the ankle and again is continuing to drain as well unfortunately. There does not appear to be any signs of active infection at this time which is good news. No fevers, chills, nausea, vomiting, or diarrhea. She tells me that since she was seen last year she is actually been doing quite well for the most part with regard to her lower extremities. Unfortunately she now is experiencing a little bit more drainage at this time. She is concerned about getting this under control so that it does not get significantly worse. 02/27/2019 on evaluation today patient appears to be doing somewhat better in regard to her bilateral lower extremity wounds. She has been tolerating the dressing changes without complication. Fortunately there is no signs of active infection at this point. No fevers, chills, nausea, vomiting, or diarrhea. She did get her dressing supplies which is  excellent news she was extremely excited to get these. She also got paperwork from [employer] for their financial assistance program where they may be able to help her out in the future if needed with supplies at discounted prices. 03/06/2019 on evaluation today patient appears to be doing a little worse with regard to both areas of weeping on her bilateral lower extremities. This is around the right medial knee and just above the left ankle. With that being said she is unfortunately not doing as well as I would like to see. I feel like she may need to potentially go see someone at the lymphedema clinic as the wraps that she needs or even beyond what we can do here at the wound care center. She really does not have wounds she just has open areas of weeping that are causing some difficulty for her. Subsequently because of this and the moisture I am concerned about the potential for infection I am going to likely give her a prophylactic antibiotic today, Keflex, just to be on the safe side. Nonetheless again there is no obvious signs of active infection at this time. 03/13/2019 on evaluation today patient appears to be doing well with regard to her bilateral lower extremities where she has been weeping compared to even last week's evaluation. I see some areas of new skin growth which is excellent and overall I am very pleased with how things seem to be progressing. No fevers, chills, nausea, vomiting, or diarrhea. 03/20/2019 on evaluation  today patient unfortunately is continuing to have issues with significant edema of the left lower extremity. Her right side seems to be doing much better. Unfortunately her left side is showing increased weeping of the lower portion of her leg. This is quite unfortunate obviously we were hoping to get her into the lymphedema clinic they really do not seem to when I see her how if she is draining. Despite the fact this is really not wound related but more lymphedema  weeping related. Nonetheless I do not know that this can be helpful for her to even go for that appointment since again I am not sure there is much that they would actually do at this point. We may need to try a 4 layer compression wrap as best we can on her leg. She is on the Augmentin currently although I am still concerned about whether or not there could be potentially something going on infection wise I would obtain a culture though I understand is not the best being that is a surface culture I just 1 to make sure I do not seem to be missing anything. 03/27/2019 on evaluation today patient appears to be doing much better in regard to the left lower extremity compared to last week. Last week she had tremendous weeping which I think was subsequent to infection now she seems to be doing much better and very pleased. This is not completely healed but there is a lot of new skin growth and it has dried out quite a bit. Overall I think that we are doing well with how things are moving along at this time. No fevers, chills, nausea, vomiting, or diarrhea. 04/03/2019 evaluation today patient appears to be doing a little worse this week compared to last time I saw her. I think this may be due to the fact that she is having issues with not being able to sleep in her bed at least not until last night. She is therefore been in a lift chair and subsequently has also had issues with not been able to use her pumps since she could not get in bed. With that being said the patient overall seems to be doing okay I do think I may want extend the antibiotic for a little bit longer at least until we can see if her edema and her weeping gets better and if it is then obviously I can always discontinue the antibiotics as of next week however I want her to continue to have it over the next week. 04/10/2019 on evaluation today patient unfortunately is still doing poorly with regard to her left lower extremity. Her right is  all things considering doing fairly well. On the left however she continues to have spreading of the area of infection and weeping which appears to be even a larger surface area than noted last week. She did have a positive culture for Pseudomonas in particular which seems to have been of concern she still has green/yellow discharge consistent with Pseudomonas and subsequently a tremendous amount of it. This has me obviously still concerned about the infection not really clearing up despite the fact that on culture it appears the Cipro should have been a good option for treating this. I think she may at this point need IV antibiotics since things are not doing better I do not want to get worse and cause sepsis. She is in agreement with the plan and believes as well that she likely does need to go to the hospital for  IV vancomycin. Or something of the like depending on what the recommendation is from the ER. Electronic Signature(s) Signed: 04/10/2019 9:46:07 AM By: Ceasar Lund, Perry ([id number]) Entered By: Worthy Keeler on 04/10/2019 09:46:07 [name] ([id number]) -------------------------------------------------------------------------------- Physical Exam Details Patient Name: [name], [name]. Date of Service: 04/10/2019 9:15 AM Medical Record Number: [id number] Patient Account Number: [id number] Date of Birth/Sex: 07-19-1955 (63 y.o. F) Treating RN: Montey Hora Primary Care Provider: Tandy [name] Other Clinician: Referring Provider: Tandy [name] Treating Provider/Extender: [name], [name] Weeks in Treatment: 7 Constitutional Obese and well-hydrated in no acute distress. Respiratory normal breathing without difficulty. clear to auscultation bilaterally. Cardiovascular regular rate and rhythm with normal S1, S2. Psychiatric this patient is able to make decisions and demonstrates good insight into disease process. Alert and Oriented x 3. pleasant and  cooperative. Notes Patient's wound bed again is really an area of significant weeping and cellulitis which does have green/yellow drainage consistent with Pseudomonas and this is also what cultured when we sent her wound culture previous. She has been on Cipro for 17 days unfortunately she really does not seem to be showing signs of improvement at this time. Electronic Signature(s) Signed: 04/10/2019 9:46:44 AM By: Worthy Keeler PA-C Entered By: Worthy Keeler on 04/10/2019 09:46:43 [name], [name] ([id number]) -------------------------------------------------------------------------------- Physician Orders Details Patient Name: [name] Date of Service: 04/10/2019 9:15 AM Medical Record Number: [id number] Patient Account Number: [id number] Date of Birth/Sex: 1956/04/22 (63 y.o. F) Treating RN: Montey Hora Primary Care Provider: Tandy [name] Other Clinician: Referring Provider: Tandy [name] Treating Provider/Extender: [name], Jordin Vicencio Weeks in Treatment: 7 Verbal / Phone Orders: No Diagnosis Coding ICD-10 Coding Code Description I89.0 Lymphedema, not elsewhere classified L97.822 Non-pressure chronic ulcer of other part of left lower leg with fat layer exposed L97.812 Non-pressure chronic ulcer of other part of right lower leg with fat layer exposed Wound Cleansing Wound #3 Left,Medial Lower Leg o Dial antibacterial soap, wash wounds, rinse and pat dry prior to dressing wounds o May Shower, gently pat wound dry prior to applying new dressing. Primary Wound Dressing Wound #3 Left,Medial Lower Leg o ABD Pad o XtraSorb - or Zetuvit or KerraMax Secondary Dressing Wound #3 Left,Medial Lower Leg o Conform/Kerlix Dressing Change Frequency Wound #3 Left,Medial Lower Leg o Change Dressing Monday, Wednesday, Friday Follow-up Appointments o Return Appointment in 1 week. o Nurse Visit as needed - Monday and Wednesday Edema Control Wound #3 Left,Medial  Lower Leg o Other: - ACE Wrap or 3rd layer of 4 layer wrap Notes ABD pads and kerlix in clinic today. Ms Hanish is going to [facility] ER for evaluation for cellulitis. Electronic Signature(s) Signed: 04/10/2019 4:32:36 PM By: Worthy Keeler PA-C Signed: 04/10/2019 4:39:44 PM By: Montey Hora Entered By: Montey Hora on 04/10/2019 09:42:30 [name], [name] ([id number]) -------------------------------------------------------------------------------- Problem List Details Patient Name: [name], WEHBE. Date of Service: 04/10/2019 9:15 AM Medical Record Number: [id number] Patient Account Number: [id number] Date of Birth/Sex: 1956/04/28 (63 y.o. F) Treating RN: Montey Hora Primary Care Provider: Tandy [name] Other Clinician: Referring Provider: Tandy [name] Treating Provider/Extender: Melburn Hake, Parks Czajkowski Weeks in Treatment: 7 Active Problems ICD-10 Evaluated Encounter Code Description Active Date Today Diagnosis I89.0 Lymphedema, not elsewhere classified 02/20/2019 No Yes L97.822 Non-pressure chronic ulcer of other part of left lower leg with 02/20/2019 No Yes fat layer exposed L97.812 Non-pressure chronic ulcer of other part of right lower leg 02/20/2019 No Yes with fat layer exposed Inactive Problems Resolved Problems  Electronic Signature(s) Signed: 04/10/2019 9:35:22 AM By: Worthy Keeler PA-C Entered By: Worthy Keeler on 04/10/2019 09:35:21 Leather, [name] ([id number]) -------------------------------------------------------------------------------- Progress Note Details Patient Name: [name]. Date of Service: 04/10/2019 9:15 AM Medical Record Number: [id number] Patient Account Number: [id number] Date of Birth/Sex: [birth year] (63 y.o. F) Treating RN: Montey Hora Primary Care Provider: Tandy [name] Other Clinician: Referring Provider: Tandy [name] Treating Provider/Extender: [name], Wlliam Grosso Weeks in Treatment: 7 Subjective Chief Complaint Information obtained  from Patient Left LE Ulcer History of Present Illness (HPI) The patient is a 63 year old female with history of hypertension and a long-standing history of bilateral lower extremity lymphedema (first presented on 4/2) . She has had open ulcers in the past which have always responded to compression therapy. She had briefly been to a lymphedema clinic in the past which helped her at the time. this time around she stopped treatment of her lymphedema pumps approximately 2 weeks ago because of some pain in the knees and then noticed the right leg getting worse. She was seen by her PCP who put her on clindamycin 4 times a day 2 days ago. The patient has seen [facility] and Dr. Delana Meyer had seen her last year where a vascular study including venous and arterial duplex studies were within normal limits. he had recommended compression stockings and lymphedema pumps and the patient has been using this in about 2 weeks ago. She is known to be diabetic but in the past few time she's gone to her primary care doctor her hemoglobin A1c has been normal. 02/11/2015 - after her last visit she took my advice and went to the ER regarding the progressive cellulitis of her right lower extremity and she was admitted between July 17 and 22nd. She received IV antibiotics and then was sent home on a course of steroid-induced and oral antibiotics. She has improved much since then. 02/17/2015 -- she has been doing fine and the weeping of her legs has remarkably gone down. She has no fresh issues. READMISSION 01/15/18 This patient was given this clinic before most recently in 2016 seen by Dr. Con Memos. She has massive bilateral lymphedema and over the last 2 months this had weeping edema out of the left leg. She has compression pumps but her compliance with these has been minimal. She has [facility] they've been using TCA/ABDs/kerlix under an Ace wrap.she has had recent problems with cellulitis. She was apparently seen in  the ER and 12/23/17 and given clindamycin. She was then followed by her primary doctor and given doxycycline and Keflex. The pain seems to have settled down. In April 2018 the patient had arterial studies done at [facility]. This showed triphasic waveforms throughout the right leg and mostly triphasic waveforms on the left except for monophasic at the posterior tibial artery distally. She was not felt to have evidence of right lower extremity arterial stenosis or significant problems on the left side. 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 She also had a right lower extremity venous Doppler in January 2018 this was limited by the patient's body habitus and lymphedema. Most of the proximal veins were not visualized The patient presents with an area of denuded skin on the anterior medial part of the left calf. There is weeping edema fluid here. 01/22/18; the patient has somewhat better edema control using her compression pumps twice a day and as a result she has much better epithelialization on the left anterior calf area. Only  a small open area remains. 01/29/18; the patient has been compliant with her compression pumps. Both the areas on her calf that healed. The remaining area on the left anterior leg is fully epithelialized CERA, PFOST. ([id number]) Readmission: 02/20/2019 upon evaluation today patient presents for reevaluation due to issues that she is having with the bilateral lower extremities. She actually has wounds open on both legs. On the right she has an area in the crease of her leg on the right around the knee region which is actually draining quite a bit and actually has some fungal type appearance to it. She has been on nystatin powder that seems to have helped to some degree. In regard to the left lower extremity this is actually in the lower portion of her leg closer to the ankle and again is continuing to drain as well unfortunately.  There does not appear to be any signs of active infection at this time which is good news. No fevers, chills, nausea, vomiting, or diarrhea. She tells me that since she was seen last year she is actually been doing quite well for the most part with regard to her lower extremities. Unfortunately she now is experiencing a little bit more drainage at this time. She is concerned about getting this under control so that it does not get significantly worse. 02/27/2019 on evaluation today patient appears to be doing somewhat better in regard to her bilateral lower extremity wounds. She has been tolerating the dressing changes without complication. Fortunately there is no signs of active infection at this point. No fevers, chills, nausea, vomiting, or diarrhea. She did get her dressing supplies which is excellent news she was extremely excited to get these. She also got paperwork from [employer] for their financial assistance program where they may be able to help her out in the future if needed with supplies at discounted prices. 03/06/2019 on evaluation today patient appears to be doing a little worse with regard to both areas of weeping on her bilateral lower extremities. This is around the right medial knee and just above the left ankle. With that being said she is unfortunately not doing as well as I would like to see. I feel like she may need to potentially go see someone at the lymphedema clinic as the wraps that she needs or even beyond what we can do here at the wound care center. She really does not have wounds she just has open areas of weeping that are causing some difficulty for her. Subsequently because of this and the moisture I am concerned about the potential for infection I am going to likely give her a prophylactic antibiotic today, Keflex, just to be on the safe side. Nonetheless again there is no obvious signs of active infection at this time. 03/13/2019 on evaluation today patient appears to be  doing well with regard to her bilateral lower extremities where she has been weeping compared to even last week's evaluation. I see some areas of new skin growth which is excellent and overall I am very pleased with how things seem to be progressing. No fevers, chills, nausea, vomiting, or diarrhea. 03/20/2019 on evaluation today patient unfortunately is continuing to have issues with significant edema of the left lower extremity. Her right side seems to be doing much better. Unfortunately her left side is showing increased weeping of the lower portion of her leg. This is quite unfortunate obviously we were hoping to get her into the lymphedema clinic they really do not seem  to when I see her how if she is draining. Despite the fact this is really not wound related but more lymphedema weeping related. Nonetheless I do not know that this can be helpful for her to even go for that appointment since again I am not sure there is much that they would actually do at this point. We may need to try a 4 layer compression wrap as best we can on her leg. She is on the Augmentin currently although I am still concerned about whether or not there could be potentially something going on infection wise I would obtain a culture though I understand is not the best being that is a surface culture I just 1 to make sure I do not seem to be missing anything. 03/27/2019 on evaluation today patient appears to be doing much better in regard to the left lower extremity compared to last week. Last week she had tremendous weeping which I think was subsequent to infection now she seems to be doing much better and very pleased. This is not completely healed but there is a lot of new skin growth and it has dried out quite a bit. Overall I think that we are doing well with how things are moving along at this time. No fevers, chills, nausea, vomiting, or diarrhea. 04/03/2019 evaluation today patient appears to be doing a little worse  this week compared to last time I saw her. I think this may be due to the fact that she is having issues with not being able to sleep in her bed at least not until last night. She is therefore been in a lift chair and subsequently has also had issues with not been able to use her pumps since she could not get in bed. With that being said the patient overall seems to be doing okay I do think I may want extend the antibiotic for a little bit longer at least until we can see if her edema and her weeping gets better and if it is then obviously I can always discontinue the antibiotics as of next week however I want her to continue to have it over the next week. 04/10/2019 on evaluation today patient unfortunately is still doing poorly with regard to her left lower extremity. Her right is all things considering doing fairly well. On the left however she continues to have spreading of the area of infection and weeping which appears to be even a larger surface area than noted last week. She did have a positive culture for Pseudomonas in particular which seems to have been of concern she still has green/yellow discharge consistent with Pseudomonas and subsequently a tremendous amount of it. This has me obviously still concerned about the infection not really clearing up despite the fact that on culture it appears the Cipro should have been a good option for treating this. I think she may at this point need IV antibiotics since things are not doing better I do not want to get worse and cause sepsis. She is in agreement [name], [name]. ([id number]) with the plan and believes as well that she likely does need to go to the hospital for IV vancomycin. Or something of the like depending on what the recommendation is from the ER. Patient History Information obtained from Patient. Family History Cancer - Father, Diabetes - Father, Heart Disease - Mother, Hypertension - Mother,Father, Seizures - Mother, Stroke  - Mother, Thyroid Problems - Mother,Siblings, No family history of Hereditary Spherocytosis, Kidney Disease, Lung  Disease, Tuberculosis. Social History Former smoker - uses snuff, Marital Status - Single, Alcohol Use - Rarely, Drug Use - No History, Caffeine Use - Daily. 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 Denies history of Aspiration, Chronic Obstructive Pulmonary Disease (COPD), Pneumothorax Cardiovascular Patient has history of Hypertension Denies history of Angina, Arrhythmia, Congestive Heart Failure, Coronary Artery Disease, Deep Vein Thrombosis, Hypotension, Myocardial Infarction, Peripheral Arterial Disease, Peripheral Venous Disease, Phlebitis, Vasculitis Gastrointestinal Denies history of Cirrhosis , Colitis, Crohn s, Hepatitis A, Hepatitis B, Hepatitis C Endocrine Denies history of Type I Diabetes, Type II Diabetes Genitourinary Denies history of End Stage Renal Disease Immunological Denies history of Lupus Erythematosus, Raynaud s, Scleroderma Integumentary (Skin) Denies history of History of Burn, History of pressure wounds Musculoskeletal Patient has history of Gout, Osteoarthritis Denies history of Rheumatoid Arthritis, Osteomyelitis Neurologic Denies history of Dementia, Neuropathy, Quadriplegia, Paraplegia, Seizure Disorder Oncologic Denies history of Received Chemotherapy, Received Radiation Psychiatric Denies history of Anorexia/bulimia, Confinement Anxiety Review of Systems (ROS) Constitutional Symptoms (General Health) Denies complaints or  symptoms of Fatigue, Fever, Chills, Marked Weight Change. Respiratory Denies complaints or symptoms of Chronic or frequent coughs, Shortness of Breath. Cardiovascular Complains or has symptoms of LE edema. Denies complaints or symptoms of Chest pain. Psychiatric [name], [name] ([id number]) Denies complaints or symptoms of Anxiety, Claustrophobia. Objective Constitutional Obese and well-hydrated in no acute distress. Vitals Time Taken: 9:28 AM, Height: 62 in, Weight: 390 lbs, BMI: 71.3, Temperature: 99.4 F, Pulse: 65 bpm, Respiratory Rate: 16 breaths/min, Blood Pressure: 144/68 mmHg. Respiratory normal breathing without difficulty. clear to auscultation bilaterally. Cardiovascular regular rate and rhythm with normal S1, S2. Psychiatric this patient is able to make decisions and demonstrates good insight into disease process. Alert and Oriented x 3. pleasant and cooperative. General Notes: Patient's wound bed again is really an area of significant weeping and cellulitis which does have green/yellow drainage consistent with Pseudomonas and this is also what cultured when we sent her wound culture previous. She has been on Cipro for 17 days unfortunately she really does not seem to be showing signs of improvement at this time. Integumentary (Hair, Skin) Wound #3 status is Open. Original cause of wound was Blister. The wound is located on the Left,Medial Lower Leg. The wound measures 16cm length x 31cm width x 0.1cm depth; 389.557cm^2 area and 38.956cm^3 volume. There is Fat Layer (Subcutaneous Tissue) Exposed exposed. There is no tunneling or undermining noted. There is a large amount of serosanguineous drainage noted. There is medium (34-66%) red granulation within the wound bed. There is a medium (34- 66%) amount of necrotic tissue within the wound bed including Adherent Slough. Assessment Active Problems ICD-10 Lymphedema, not elsewhere classified Non-pressure chronic ulcer of  other part of left lower leg with fat layer exposed Non-pressure chronic ulcer of other part of right lower leg with fat layer exposed Schum, Nialah C. ([id number]) Plan Wound Cleansing: Wound #3 Left,Medial Lower Leg: Dial antibacterial soap, wash wounds, rinse and pat dry prior to dressing wounds May Shower, gently pat wound dry prior to applying new dressing. Primary Wound Dressing: Wound #3 Left,Medial Lower Leg: ABD Pad XtraSorb - or Zetuvit or KerraMax Secondary Dressing: Wound #3 Left,Medial Lower Leg:  Conform/Kerlix Dressing Change Frequency: Wound #3 Left,Medial Lower Leg: Change Dressing Monday, Wednesday, Friday Follow-up Appointments: Return Appointment in 1 week. Nurse Visit as needed - Monday and Wednesday Edema Control: Wound #3 Left,Medial Lower Leg: Other: - ACE Wrap or 3rd layer of 4 layer wrap General Notes: ABD pads and kerlix in clinic today. Ms Dansby is going to [facility] ER for evaluation for cellulitis. 1. I would recommend currently that based on the fact that I have not been able to get the infection/cellulitis under control with the use of oral medications, Cipro, Emina go ahead and see about sending her to the ER for further evaluation and treatment I believe she may need IV antibiotic therapy. 2. We will go ahead and put on ABDs as long at this point to get her over to the hospital so she does not have any problems with drainage in the interim. 3. We will plan to see her back following the ER stay. We will see patient back for reevaluation in 1 week here in the clinic. If anything worsens or changes patient will contact our office for additional recommendations. Electronic Signature(s) Signed: 04/10/2019 9:47:31 AM By: Worthy Keeler PA-C Entered By: Worthy Keeler on 04/10/2019 09:47:30 [place], [name] ([id number]) -------------------------------------------------------------------------------- ROS/PFSH Details Patient Name: [name] Date  of Service: 04/10/2019 9:15 AM Medical Record Number: [id number] Patient Account Number: [id number] Date of Birth/Sex: [birth year] (63 y.o. 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 Negative for: Aspiration; Chronic Obstructive Pulmonary Disease (COPD); Pneumothorax Cardiovascular Complaints and Symptoms: Positive for: LE edema Negative for: Chest pain Medical History: Positive for: Hypertension Negative for: Angina; Arrhythmia; Congestive Heart Failure; Coronary Artery Disease; Deep Vein Thrombosis; Hypotension; Myocardial Infarction; Peripheral Arterial Disease; Peripheral Venous Disease; Phlebitis; Vasculitis Psychiatric Complaints and Symptoms: Negative for: Anxiety; Claustrophobia Medical History: Negative for: Anorexia/bulimia; Confinement Anxiety Eyes Medical History: Positive for: Cataracts Negative for: Glaucoma; Optic Neuritis Ear/Nose/Mouth/Throat Medical History: Negative for: Chronic sinus problems/congestion; Middle ear problems Hematologic/Lymphatic Medical History: Positive for: Lymphedema KYLAYA, VARIO. ([id number]) Negative for: Anemia; Hemophilia; Human Immunodeficiency Virus; Sickle Cell Disease Gastrointestinal Medical History: Negative for: Cirrhosis ; Colitis; Crohnos; Hepatitis A; Hepatitis B; Hepatitis C Endocrine Medical History: Negative for: Type I Diabetes; Type II Diabetes Genitourinary Medical History: Negative for: End Stage Renal  Disease Immunological Medical History: Negative for: Lupus Erythematosus; Raynaudos; Scleroderma Integumentary (Skin) Medical History: Negative for: History of Burn; History of pressure wounds Musculoskeletal Medical History: Positive for: Gout; Osteoarthritis Negative for: Rheumatoid Arthritis; Osteomyelitis Neurologic Medical History: Negative for: Dementia; Neuropathy; Quadriplegia; Paraplegia; Seizure Disorder Oncologic Medical History: Negative for: Received Chemotherapy; Received Radiation HBO Extended History Items Eyes: Cataracts Immunizations Pneumococcal Vaccine: Received Pneumococcal Vaccination: Yes Implantable Devices No devices added Family and Social History Cancer: Yes - Father; Diabetes: Yes - Father; Heart Disease: Yes - Mother; Hereditary Spherocytosis: No; Hypertension: Yes - Mother,Father; Kidney Disease: No; Lung Disease: No; Seizures: Yes - Mother; Stroke: Yes - Mother; Thyroid Problems: Yes - Mother,Siblings; Tuberculosis: No; Former smoker - uses snuff; Marital Status - Single; Alcohol Use: Rarely; Drug Use: JACKLINE, HANLIN C. ([id number]) No History; Caffeine Use:  Daily; Financial Concerns: No; Food, Clothing or Shelter Needs: No; Support System Lacking: No; Transportation Concerns: No Physician Affirmation I have reviewed and agree with the above information. Electronic Signature(s) Signed: 04/10/2019 4:32:36 PM By: Worthy Keeler PA-C Signed: 04/10/2019 4:39:44 PM By: Montey Hora Entered By: Worthy Keeler on 04/10/2019 09:46:30 [place], [name] ([id number]) -------------------------------------------------------------------------------- SuperBill Details Patient Name: [name]. Date of Service: 04/10/2019 Medical Record Number: [id number] Patient Account Number: [id number] Date of Birth/Sex: 11-Feb-1956 (63 y.o. F) Treating RN: Montey Hora Primary Care Provider: Tandy [name] Other Clinician: Referring Provider: Tandy [name] Treating Provider/Extender: [name], Terrianna Holsclaw Weeks in Treatment: 7 Diagnosis Coding ICD-10 Codes Code Description I89.0 Lymphedema, not elsewhere classified L97.822 Non-pressure chronic ulcer of other part of left lower leg with fat layer exposed L97.812 Non-pressure chronic ulcer of other part of right lower leg with fat layer exposed Facility Procedures CPT4 Code: [id number] Description: 99213 - WOUND CARE VISIT-LEV 3 EST PT Modifier: Quantity: 1 Physician Procedures CPT4 Code Description: [id number] - WC PHYS LEVEL 4 - EST PT ICD-10 Diagnosis Description I89.0 Lymphedema, not elsewhere classified L97.822 Non-pressure chronic ulcer of other part of left lower leg wit L97.812 Non-pressure chronic ulcer of other  part of right lower leg wi Modifier: h fat layer expos th fat layer expo Quantity: 1 ed sed Electronic Signature(s) Signed: 04/10/2019 9:47:43 AM By: Worthy Keeler PA-C Entered By: Worthy Keeler on 04/10/2019 09:47:42

## 2019-04-10 NOTE — ED Notes (Signed)
First Nurse Note: Pt sent over from wound center for LLE cellulitis, pt has hx/o lymphodedma. Pt has been on 17 days of PO Cipro without improvement. Per wound center wound culture positive for Pseudomonas and Klebsiella. Pt ambulatory into ED using cain. Pt is in NAD.

## 2019-04-11 LAB — SARS CORONAVIRUS 2 (TAT 6-24 HRS): SARS Coronavirus 2: NEGATIVE

## 2019-04-11 MED ORDER — SODIUM CHLORIDE 0.9 % IV SOLN
INTRAVENOUS | Status: DC | PRN
  Administered 2019-04-11 – 2019-04-12 (×4): 250 mL via INTRAVENOUS
  Administered 2019-04-13: 30 mL/h via INTRAVENOUS

## 2019-04-11 NOTE — Progress Notes (Signed)
[facility] [place] NAME: [name]    MR#:  [id number]  DATE OF BIRTH:  1955/12/14  SUBJECTIVE:  CHIEF COMPLAINT: pt has ch lympedema and lately with redness and worsening of drainage  REVIEW OF SYSTEMS:  CONSTITUTIONAL: No fever, fatigue or weakness.  EYES: No blurred or double vision.  EARS, NOSE, AND THROAT: No tinnitus or ear pain.  RESPIRATORY: No cough, shortness of breath, wheezing or hemoptysis.  CARDIOVASCULAR: No chest pain, orthopnea, edema.  GASTROINTESTINAL: No nausea, vomiting, diarrhea or abdominal pain.  GENITOURINARY: No dysuria, hematuria.  ENDOCRINE: No polyuria, nocturia,  HEMATOLOGY: No anemia, easy bruising or bleeding SKIN: Worsening of the redness and discharge of left leg MUSCULOSKELETAL: No joint pain or arthritis.   NEUROLOGIC: No tingling, numbness, weakness.  PSYCHIATRY: No anxiety or depression.   DRUG ALLERGIES:   Allergies  Allergen Reactions  . Ace Inhibitors Hives and Swelling  . Ibuprofen Hives and Swelling    VITALS:  Blood pressure 126/66, pulse 72, temperature 98.4 F (36.9 C), temperature source Oral, resp. rate 16, height 5' 3.5" (1.613 m), weight (!) 176.9 kg, SpO2 95 %.  PHYSICAL EXAMINATION:  GENERAL:  63 y.o.-year-old patient lying in the bed with no acute distress.  EYES: Pupils equal, round, reactive to light and accommodation. No scleral icterus. Extraocular muscles intact.  HEENT: Head atraumatic, normocephalic. Oropharynx and nasopharynx clear.  NECK:  Supple, no jugular venous distention. No thyroid enlargement, no tenderness.  LUNGS: Normal breath sounds bilaterally, no wheezing, rales,rhonchi or crepitation. No use of accessory muscles of respiration.  CARDIOVASCULAR: S1, S2 normal. No murmurs, rubs, or gallops.  ABDOMEN: Soft, nontender, nondistended. Bowel sounds present.  EXTREMITIES: No pedal edema, cyanosis, or clubbing.  NEUROLOGIC: Cranial nerves II through  XII are intact. Muscle strength 5/5 in all extremities. Sensation intact. Gait not checked.  PSYCHIATRIC: The patient is alert and oriented x 3.  SKIN: Left leg erythematous, purulent discharge.  Chronic lymphedema.   LABORATORY PANEL:   CBC Recent Labs  Lab 04/10/19 1044  WBC 5.3  HGB 11.8*  HCT 38.9  PLT 186   ------------------------------------------------------------------------------------------------------------------  Chemistries  Recent Labs  Lab 04/10/19 1044  NA 138  K 4.3  CL 104  CO2 26  GLUCOSE 92  BUN 17  CREATININE 0.89  CALCIUM 8.7*  AST 22  ALT 17  ALKPHOS 107  BILITOT 0.8   ------------------------------------------------------------------------------------------------------------------  Cardiac Enzymes No results for input(s): TROPONINI in the last 168 hours. ------------------------------------------------------------------------------------------------------------------  RADIOLOGY:  US Venous Img Lower Bilateral  Result Date: 04/10/2019 CLINICAL DATA:  Bilateral leg edema. EXAM: BILATERAL LOWER EXTREMITY VENOUS DOPPLER ULTRASOUND TECHNIQUE: Gray-scale sonography with graded compression, as well as color Doppler and duplex ultrasound were performed to evaluate the lower extremity deep venous systems from the level of the common femoral vein and including the common femoral, femoral, profunda femoral, popliteal and calf veins including the posterior tibial, peroneal and gastrocnemius veins when visible. The superficial great saphenous vein was also interrogated. Spectral Doppler was utilized to evaluate flow at rest and with distal augmentation maneuvers in the common femoral, femoral and popliteal veins. COMPARISON:  None. FINDINGS: RIGHT LOWER EXTREMITY Common Femoral Vein: No evidence of thrombus. Normal compressibility, respiratory phasicity and response to augmentation. Saphenofemoral Junction: No evidence of thrombus. Normal compressibility and  flow on color Doppler imaging. Profunda Femoral Vein: No evidence of thrombus. Normal compressibility and flow on color Doppler imaging. Femoral Vein: No evidence of thrombus. Normal compressibility,  respiratory phasicity and response to augmentation. Popliteal Vein: No evidence of thrombus. Normal compressibility, respiratory phasicity and response to augmentation. Calf Veins: No evidence of thrombus. Normal compressibility and flow on color Doppler imaging. Superficial Great Saphenous Vein: No evidence of thrombus. Normal compressibility. Venous Reflux:  Not evaluated Other Findings:  Lower extremity edema is noted. LEFT LOWER EXTREMITY Common Femoral Vein: No evidence of thrombus. Normal compressibility, respiratory phasicity and response to augmentation. Saphenofemoral Junction: No evidence of thrombus. Normal compressibility and flow on color Doppler imaging. Profunda Femoral Vein: No evidence of thrombus. Normal compressibility and flow on color Doppler imaging. Femoral Vein: No evidence of thrombus. Normal compressibility, respiratory phasicity and response to augmentation. Popliteal Vein: No evidence of thrombus. Normal compressibility, respiratory phasicity and response to augmentation. Calf Veins: No evidence of thrombus. Normal compressibility and flow on color Doppler imaging. Superficial Great Saphenous Vein: No evidence of thrombus. Normal compressibility. Venous Reflux:  Not evaluated Other Findings:  Lower extremity edema is noted. IMPRESSION: 1. No DVT.  The calf veins were poorly evaluated. 2. Nonspecific lower extremity edema is noted. Electronically Signed   By: Constance Holster M.D.   On: 04/10/2019 14:09    EKG:   Orders placed or performed during the hospital encounter of 03/25/19  . EKG 12-Lead  . EKG 12-Lead  . EKG    ASSESSMENT AND PLAN:   63 year old female with morbid obesity and chronic lymphedema seen at the wound care clinic today who presents from the clinic due to  increased erythema of the left leg and worsening drainage with recent cultures positive for Pseudomonas and Klebsiella.  1.  Left lower extremity cellulitis on top of chronic lymphedema and morbid obesity: Previous cultures were positive for Pseudomonas and Klebsiella, patient is started on cefepime as per previous sensitivities on wound culture Elevate leg by 4 pillows We will consult vascular surgery and wound care consult Lower extremity Doppler negative DVT   2: CPAP If tolerated  3.  Chronic lower extremity lymphedema Will need frequent wrapping Continue atenolol   4.  Morbid obesity: Encouraged weight loss as tolerated     All the records are reviewed and case discussed with Care Management/Social Workerr. Management plans discussed with the patient, family and they are in agreement.  CODE STATUS: fc  TOTAL TIME TAKING CARE OF THIS PATIENT: 35  minutes.   POSSIBLE D/C IN 3  DAYS, DEPENDING ON CLINICAL CONDITION.  Note: This dictation was prepared with Dragon dictation along with smaller phrase technology. Any transcriptional errors that result from this process are unintentional.   Nicholes Mango M.D on 04/11/2019 at 2:32 PM  Between 7am to 6pm - Pager - [phone] After 6pm go to [email or web address] - [email or web address]  [facility]  Office  [phone]  CC: Primary care physician; Ranae Plumber, [place]

## 2019-04-12 ENCOUNTER — Inpatient Hospital Stay: Payer: Medicaid Other

## 2019-04-12 LAB — RESPIRATORY PANEL BY PCR

## 2019-04-12 MED ORDER — IPRATROPIUM-ALBUTEROL 0.5-2.5 (3) MG/3ML IN SOLN
3.0000 mL | Freq: Four times a day (QID) | RESPIRATORY_TRACT | Status: DC
  Administered 2019-04-12 – 2019-04-13 (×3): 3 mL via RESPIRATORY_TRACT
  Filled 2019-04-12 (×3): qty 3

## 2019-04-12 MED ORDER — ALBUTEROL SULFATE (2.5 MG/3ML) 0.083% IN NEBU
2.5000 mg | INHALATION_SOLUTION | RESPIRATORY_TRACT | Status: DC | PRN
  Administered 2019-04-12 (×2): 2.5 mg via RESPIRATORY_TRACT
  Filled 2019-04-12 (×2): qty 3

## 2019-04-12 MED ORDER — METHYLPREDNISOLONE SODIUM SUCC 40 MG IJ SOLR
40.0000 mg | Freq: Two times a day (BID) | INTRAMUSCULAR | Status: DC
  Administered 2019-04-12 – 2019-04-13 (×2): 40 mg via INTRAVENOUS
  Filled 2019-04-12 (×2): qty 1

## 2019-04-12 NOTE — Consult Note (Signed)
Reason for Consult:Chronic Lymphedema and Cellulitis Referring Physician: Dr. Tyler Deis [name] is an 63 y.o. female.  HPI: Patient with morbid obesity, lymphedema and history of cellulitis and chronic wounds. Patient states she developed worsening cellulitis and a small wound on the left lower extremity in April. She had been seen by wound care previously. However, because of home care and, elective visit delays she was unable to receive care. She states it became worse. Now with cellulitis and wound with concominant lymphedema.  Past Medical History:  Diagnosis Date  . Arthritis   . Asthma   . Enlarged heart   . Hypertension   . Lymphedema   . Sleep apnea   . Spinal stenosis     Past Surgical History:  Procedure Laterality Date  . COLONOSCOPY WITH PROPOFOL N/A 03/12/2018   Procedure: COLONOSCOPY WITH PROPOFOL;  Surgeon: Manya Silvas, MD;  Location: [facility] ENDOSCOPY;  Service: Endoscopy;  Laterality: N/A;  . NO PAST SURGERIES      Family History  Problem Relation Age of Onset  . Thyroid disease Other   . Breast cancer Maternal Aunt     Social History:  reports that she has quit smoking. Her smokeless tobacco use includes snuff. She reports that she does not drink alcohol or use drugs.  Allergies:  Allergies  Allergen Reactions  . Ace Inhibitors Hives and Swelling  . Ibuprofen Hives and Swelling    Medications: I have reviewed the patient's current medications.  Results for orders placed or performed during the hospital encounter of 04/10/19 (from the past 48 hour(s))  CBC with Differential     Status: Abnormal   Collection Time: 04/10/19 10:44 AM  Result Value Ref Range   WBC 5.3 4.0 - 10.5 K/uL   RBC 4.58 3.87 - 5.11 MIL/uL   Hemoglobin 11.8 (L) 12.0 - 15.0 g/dL   HCT 38.9 36.0 - 46.0 %   MCV 84.9 80.0 - 100.0 fL   MCH 25.8 (L) 26.0 - 34.0 pg   MCHC 30.3 30.0 - 36.0 g/dL   RDW 14.6 11.5 - 15.5 %   Platelets 186 150 - 400 K/uL   nRBC 0.0 0.0 - 0.2 %    Neutrophils Relative % 65 %   Neutro Abs 3.4 1.7 - 7.7 K/uL   Lymphocytes Relative 24 %   Lymphs Abs 1.3 0.7 - 4.0 K/uL   Monocytes Relative 11 %   Monocytes Absolute 0.6 0.1 - 1.0 K/uL   Eosinophils Relative 0 %   Eosinophils Absolute 0.0 0.0 - 0.5 K/uL   Basophils Relative 0 %   Basophils Absolute 0.0 0.0 - 0.1 K/uL   Immature Granulocytes 0 %   Abs Immature Granulocytes 0.02 0.00 - 0.07 K/uL    Comment: Performed at [facility], [address] [place], [place] [address]  Comprehensive metabolic panel     Status: Abnormal   Collection Time: 04/10/19 10:44 AM  Result Value Ref Range   Sodium 138 135 - 145 mmol/L   Potassium 4.3 3.5 - 5.1 mmol/L   Chloride 104 98 - 111 mmol/L   CO2 26 22 - 32 mmol/L   Glucose, Bld 92 70 - 99 mg/dL   BUN 17 8 - 23 mg/dL   Creatinine, Ser 0.89 0.44 - 1.00 mg/dL   Calcium 8.7 (L) 8.9 - 10.3 mg/dL   Total Protein 8.3 (H) 6.5 - 8.1 g/dL   Albumin 3.4 (L) 3.5 - 5.0 g/dL   AST 22 15 - 41  U/L   ALT 17 0 - 44 U/L   Alkaline Phosphatase 107 38 - 126 U/L   Total Bilirubin 0.8 0.3 - 1.2 mg/dL   GFR calc non Af Amer >60 >60 mL/min   GFR calc Af Amer >60 >60 mL/min   Anion gap 8 5 - 15    Comment: Performed at [facility], [place], [place] [address]  SARS CORONAVIRUS 2 (TAT 6-24 HRS) Nasopharyngeal Nasopharyngeal Swab     Status: None   Collection Time: 04/10/19 12:19 PM   Specimen: Nasopharyngeal Swab  Result Value Ref Range   SARS Coronavirus 2 NEGATIVE NEGATIVE    Comment: (NOTE) SARS-CoV-2 target nucleic acids are NOT DETECTED. The SARS-CoV-2 RNA is generally detectable in upper and lower respiratory specimens during the acute phase of infection. Negative results do not preclude SARS-CoV-2 infection, do not rule out co-infections with other pathogens, and should not be used as the sole basis for treatment or other patient management decisions. Negative results must be combined with clinical  observations, patient history, and epidemiological information. The expected result is Negative. Fact Sheet for Patients: SugarRoll.be Fact Sheet for Healthcare Providers: https://www.woods-mathews.com/ This test is not yet approved or cleared by the [place] FDA and  has been authorized for detection and/or diagnosis of SARS-CoV-2 by FDA under an Emergency Use Authorization (EUA). This EUA will remain  in effect (meaning this test can be used) for the duration of the COVID-19 declaration under Section 56 4(b)(1) of the Act, 21 U.S.C. section 360bbb-3(b)(1), unless the authorization is terminated or revoked sooner. Performed at [facility], [place], [place], [place] [address]     US Venous Img Lower Bilateral  Result Date: 04/10/2019 CLINICAL DATA:  Bilateral leg edema. EXAM: BILATERAL LOWER EXTREMITY VENOUS DOPPLER ULTRASOUND TECHNIQUE: Gray-scale sonography with graded compression, as well as color Doppler and duplex ultrasound were performed to evaluate the lower extremity deep venous systems from the level of the common femoral vein and including the common femoral, femoral, profunda femoral, popliteal and calf veins including the posterior tibial, peroneal and gastrocnemius veins when visible. The superficial great saphenous vein was also interrogated. Spectral Doppler was utilized to evaluate flow at rest and with distal augmentation maneuvers in the common femoral, femoral and popliteal veins. COMPARISON:  None. FINDINGS: RIGHT LOWER EXTREMITY Common Femoral Vein: No evidence of thrombus. Normal compressibility, respiratory phasicity and response to augmentation. Saphenofemoral Junction: No evidence of thrombus. Normal compressibility and flow on color Doppler imaging. Profunda Femoral Vein: No evidence of thrombus. Normal compressibility and flow on color Doppler imaging. Femoral Vein: No evidence of thrombus. Normal  compressibility, respiratory phasicity and response to augmentation. Popliteal Vein: No evidence of thrombus. Normal compressibility, respiratory phasicity and response to augmentation. Calf Veins: No evidence of thrombus. Normal compressibility and flow on color Doppler imaging. Superficial Great Saphenous Vein: No evidence of thrombus. Normal compressibility. Venous Reflux:  Not evaluated Other Findings:  Lower extremity edema is noted. LEFT LOWER EXTREMITY Common Femoral Vein: No evidence of thrombus. Normal compressibility, respiratory phasicity and response to augmentation. Saphenofemoral Junction: No evidence of thrombus. Normal compressibility and flow on color Doppler imaging. Profunda Femoral Vein: No evidence of thrombus. Normal compressibility and flow on color Doppler imaging. Femoral Vein: No evidence of thrombus. Normal compressibility, respiratory phasicity and response to augmentation. Popliteal Vein: No evidence of thrombus. Normal compressibility, respiratory phasicity and response to augmentation. Calf Veins: No evidence of thrombus. Normal compressibility and flow on color Doppler  imaging. Superficial Great Saphenous Vein: No evidence of thrombus. Normal compressibility. Venous Reflux:  Not evaluated Other Findings:  Lower extremity edema is noted. IMPRESSION: 1. No DVT.  The calf veins were poorly evaluated. 2. Nonspecific lower extremity edema is noted. Electronically Signed   By: Constance Holster M.D.   On: 04/10/2019 14:09    Review of Systems  Constitutional: Negative for fever.  Respiratory: Negative.  Negative for cough and hemoptysis.   Cardiovascular: Positive for leg swelling. Negative for chest pain and palpitations.  Gastrointestinal: Negative.  Negative for abdominal pain.  Musculoskeletal: Positive for back pain and joint pain.  Neurological: Negative.  Negative for dizziness.  Endo/Heme/Allergies: Negative.    Blood pressure (!) 164/82, pulse 79, temperature 98.6 F  (37 C), resp. rate 20, height 5' 3.5" (1.613 m), weight (!) 176.9 kg, SpO2 97 %. Physical Exam  Nursing note and vitals reviewed. Constitutional: She is oriented to person, place, and time. She appears well-developed.  Cardiovascular: Normal rate, regular rhythm and intact distal pulses.  Respiratory: Effort normal and breath sounds normal. No respiratory distress.  GI: Soft. Bowel sounds are normal.  Musculoskeletal:        General: Edema present. No tenderness.     Comments: Lymphedema lower extremities, small ulceration anterior lower left leg, cellulitis, no drainage. Palpable DP  Neurological: She is alert and oriented to person, place, and time.  Skin: Skin is warm.    Assessment/Plan: Lymphedema and Cellulitis LEFT leg.  Wound Care recommendations: Wash LLE with soap and water. Dry thoroughly. Place Aquacel Ag Kellie Simmering [phone]) over areas with drainage. Secure with kerlex. If drainage is heavy, top with ABD pad(s).  Change daily.  Follow Up outpatient with wound care as scheduled.  Continue ABX.  No Vascular Surgery intervention recommended at this time  Evaristo Bury 04/12/2019, 9:49 AM

## 2019-04-12 NOTE — Progress Notes (Addendum)
[name] [place] NAME: [name]    MR#:  [id number]  DATE OF BIRTH:  1956-01-08  SUBJECTIVE:  CHIEF COMPLAINT: pt had flu shot yesterday and today she is feeling tight and wheezy no history of COPD but has history of asthma  ch lympedema and redness and discharge are better today when compared to yesterday  REVIEW OF SYSTEMS:  CONSTITUTIONAL: No fever, fatigue or weakness.  EYES: No blurred or double vision.  EARS, NOSE, AND THROAT: No tinnitus or ear pain.  RESPIRATORY: No cough, shortness of breath, positive wheezing, no hemoptysis.  CARDIOVASCULAR: No chest pain, orthopnea, edema.  GASTROINTESTINAL: No nausea, vomiting, diarrhea or abdominal pain.  GENITOURINARY: No dysuria, hematuria.  ENDOCRINE: No polyuria, nocturia,  HEMATOLOGY: No anemia, easy bruising or bleeding SKIN: Worsening of the redness and discharge of left leg MUSCULOSKELETAL: No joint pain or arthritis.   NEUROLOGIC: No tingling, numbness, weakness.  PSYCHIATRY: No anxiety or depression.   DRUG ALLERGIES:   Allergies  Allergen Reactions  . Ace Inhibitors Hives and Swelling  . Ibuprofen Hives and Swelling    VITALS:  Blood pressure (!) 111/59, pulse 70, temperature 99.8 F (37.7 C), temperature source Oral, resp. rate (!) 21, height 5' 3.5" (1.613 m), weight (!) 176.9 kg, SpO2 97 %.  PHYSICAL EXAMINATION:  GENERAL:  63 y.o.-year-old patient lying in the bed with no acute distress.  EYES: Pupils equal, round, reactive to light and accommodation. No scleral icterus. Extraocular muscles intact.  HEENT: Head atraumatic, normocephalic. Oropharynx and nasopharynx clear.  NECK:  Supple, no jugular venous distention. No thyroid enlargement, no tenderness.  LUNGS: Moderate breath sounds bilaterally, minimal diffuse wheezing, no rales,rhonchi or crepitation. No use of accessory muscles of respiration.  CARDIOVASCULAR: S1, S2 normal. No murmurs, rubs, or  gallops.  ABDOMEN: Soft, nontender, nondistended. Bowel sounds present.  EXTREMITIES: No pedal edema, cyanosis, or clubbing.  NEUROLOGIC: Cranial nerves II through XII are intact. Muscle strength 5/5 in all extremities. Sensation intact. Gait not checked.  PSYCHIATRIC: The patient is alert and oriented x 3.  SKIN: Left leg erythematous, purulent discharge.  Chronic lymphedema.   LABORATORY PANEL:   CBC Recent Labs  Lab 04/10/19 1044  WBC 5.3  HGB 11.8*  HCT 38.9  PLT 186   ------------------------------------------------------------------------------------------------------------------  Chemistries  Recent Labs  Lab 04/10/19 1044  NA 138  K 4.3  CL 104  CO2 26  GLUCOSE 92  BUN 17  CREATININE 0.89  CALCIUM 8.7*  AST 22  ALT 17  ALKPHOS 107  BILITOT 0.8   ------------------------------------------------------------------------------------------------------------------  Cardiac Enzymes No results for input(s): TROPONINI in the last 168 hours. ------------------------------------------------------------------------------------------------------------------  RADIOLOGY:  No results found.  EKG:   Orders placed or performed during the hospital encounter of 03/25/19  . EKG 12-Lead  . EKG 12-Lead  . EKG    ASSESSMENT AND PLAN:   63 year old female with morbid obesity and chronic lymphedema seen at the wound care clinic today who presents from the clinic due to increased erythema of the left leg and worsening drainage with recent cultures positive for Pseudomonas and Klebsiella.  #Acute respiratory distress exacerbation of reactive airway disease Solu-Medrol, bronchodilator treatments DuoNebs every 6 hours Chest x-ray Echocardiogram ordered  #.  Left lower extremity cellulitis on top of chronic lymphedema and morbid obesity: Previous cultures were positive for Pseudomonas and Klebsiella, patient is started on cefepime as per previous sensitivities on wound  culture Elevate leg by 4 pillows Seen  by vascular surgery recommending wound care  Wound care is following  Lower extremity Doppler negative DVT   2: CPAP as tolerated  3.  Chronic lower extremity lymphedema Will need frequent wrapping Continue atenolol   4.  Morbid obesity: Encouraged weight loss as tolerated     All the records are reviewed and case discussed with Care Management/Social Workerr. Management plans discussed with the patient, family and they are in agreement.  CODE STATUS: fc  TOTAL TIME TAKING CARE OF THIS PATIENT: 35  minutes.   POSSIBLE D/C IN 3  DAYS, DEPENDING ON CLINICAL CONDITION.  Note: This dictation was prepared with Dragon dictation along with smaller phrase technology. Any transcriptional errors that result from this process are unintentional.   Nicholes Mango M.D on 04/12/2019 at 2:29 PM  Between 7am to 6pm - Pager - [phone] After 6pm go to [email or web address] - password [email or web address] [facility]  [facility]  Office  [phone]  CC: Primary care physician; Ranae Plumber, [place]

## 2019-04-12 NOTE — Consult Note (Signed)
[facility] Nurse wound consult note Patient receiving care in [facility] 213.  Consult completed remotely after review of record, including images. Reason for Consult: Draining LLE wound with cellulitis Wound type: chronic lymphedema. Wound bed: Erythema, edematous, draining Drainage (amount, consistency, odor) increased drainage per MD notes Dressing procedure/placement/frequency: Wash LLE with soap and water. Dry thoroughly. Place Aquacel Ag Kellie Simmering [phone]) over areas with drainage. Secure with kerlex. If drainage is heavy, top with ABD pad(s).  Change daily. Monitor the wound area(s) for worsening of condition such as: Signs/symptoms of infection,  Increase in size,  Development of or worsening of odor, Development of pain, or increased pain at the affected locations.  Notify the medical team if any of these develop.  Thank you for the consult.   [facility] nurse will not follow at this time.  Please re-consult the [facility] team if needed.  Val Riles, RN, MSN, CWOCN, CNS-BC, pager [phone]

## 2019-04-12 NOTE — Progress Notes (Signed)
Patient c/o chest pain; tightness and headache. Dr. Margaretmary Eddy aware. CXR complete, respiratory panel complete, solumedrol given and breathing treatment completed by respiratory. Will continue to monitor.   1600 pt walked to bathroom with one assist and cane successfully.

## 2019-04-13 ENCOUNTER — Inpatient Hospital Stay
Admit: 2019-04-13 | Discharge: 2019-04-13 | Disposition: A | Discharge status: Home / Self Care | Payer: Medicaid Other | Attending: Internal Medicine | Admitting: Internal Medicine

## 2019-04-13 DIAGNOSIS — R0600 Dyspnea, unspecified: Secondary | ICD-10-CM

## 2019-04-13 DIAGNOSIS — I890 Lymphedema, not elsewhere classified: Secondary | ICD-10-CM

## 2019-04-13 LAB — ECHOCARDIOGRAM COMPLETE
Height: 63.5 in
Weight: 6240 oz

## 2019-04-13 MED ORDER — IPRATROPIUM-ALBUTEROL 0.5-2.5 (3) MG/3ML IN SOLN
3.0000 mL | Freq: Two times a day (BID) | RESPIRATORY_TRACT | Status: DC
  Administered 2019-04-14: 3 mL via RESPIRATORY_TRACT
  Filled 2019-04-13: qty 3

## 2019-04-13 MED ORDER — METHYLPREDNISOLONE SODIUM SUCC 40 MG IJ SOLR
40.0000 mg | INTRAMUSCULAR | Status: DC
  Administered 2019-04-14: 40 mg via INTRAVENOUS
  Filled 2019-04-13: qty 1

## 2019-04-13 MED ORDER — PERFLUTREN LIPID MICROSPHERE
1.0000 mL | INTRAVENOUS | Status: AC | PRN
  Administered 2019-04-13: 2 mL via INTRAVENOUS
  Filled 2019-04-13: qty 10

## 2019-04-13 NOTE — Progress Notes (Signed)
[facility] Daily Progress Note   Subjective: Patient without complaint this AM. States legs are improving.   Objective: Vitals:   04/12/19 1004 04/12/19 1401 04/12/19 2111 04/13/19 0616  BP: 132/66 (!) 111/59 140/76 117/67  Pulse: 81 70 77 61  Resp: 20 (!) 21 20 20   Temp: 98.2 F (36.8 C) 99.8 F (37.7 C) 98.9 F (37.2 C) 98.7 F (37.1 C)  TempSrc: Oral Oral Oral Oral  SpO2: 93% 97% 92% 97%  Weight:      Height:        Intake/Output Summary (Last 24 hours) at 04/13/2019 1017 Last data filed at 04/13/2019 0616 Gross per 24 hour  Intake 240 ml  Output 1576 ml  Net -1336 ml   Physical Exam: A&Ox3, NAD CV: RRR Pulmonary: CTA Bilaterally Abdomen: Soft, Nontender, Nondistended Vascular: Right Lower Extremity: Severe lymphedema. Skin fibrosis noted. No open wounds. Left Lower Extremity: Severe lymphedema. Skin fibrosis noted. Wrapped.   Laboratory: CBC    Component Value Date/Time   WBC 5.3 04/10/2019 1044   HGB 11.8 (L) 04/10/2019 1044   HGB 12.8 09/28/2013 1838   HCT 38.9 04/10/2019 1044   HCT 39.8 09/28/2013 1838   PLT 186 04/10/2019 1044   PLT 222 09/28/2013 1838   BMET    Component Value Date/Time   NA 138 04/10/2019 1044   NA 136 09/30/2013 0407   K 4.3 04/10/2019 1044   K 4.1 09/30/2013 0407   CL 104 04/10/2019 1044   CL 102 09/30/2013 0407   CO2 26 04/10/2019 1044   CO2 28 09/30/2013 0407   GLUCOSE 92 04/10/2019 1044   GLUCOSE 95 09/30/2013 0407   BUN 17 04/10/2019 1044   BUN 14 09/30/2013 0407   CREATININE 0.89 04/10/2019 1044   CREATININE 0.95 09/30/2013 0407   CALCIUM 8.7 (L) 04/10/2019 1044   CALCIUM 8.4 (L) 09/30/2013 0407   GFRNONAA >60 04/10/2019 1044   GFRNONAA >60 09/30/2013 0407   GFRAA >60 04/10/2019 1044   GFRAA >60 09/30/2013 0407   Assessment/Planning: The patient is a 63 year old female with multiple medical issues including chronic lymphedema 1) Patient is well-known to our clinic.  Undergoes yearly  ABI.  Last ABI within normal limits.  Venous duplex in the past without any venous insufficiency.  Patient with upcoming appointment in October for continued yearly surveillance. 2) Had long conversation with the patient in regard to the need for improved compliance.  Discussed the importance of compression, elevation and use of her lymphedema pump at least twice a day.  The patient expresses her understanding. 3) There is no indication for any endovascular / open vascular intervention at this time.  Will see the patient in the outpatient setting.  Vascular surgery to sign off at this time.  Discussed with Dr. Ellis Parents Elizabeth Bowlds PA-C 04/13/2019 10:17 AM

## 2019-04-13 NOTE — Evaluation (Signed)
Physical Therapy Evaluation Patient Details Name: [name] MRN: [id number] DOB: [birth year] Today's Date: 04/13/2019   History of Present Illness  63 year old female with morbid obesity and chronic lymphedema seen at the wound care clinic today who presents from the clinic due to increased erythema of the left leg and worsening drainage with recent cultures positive for Pseudomonas and Klebsiella. PMH of asthma.    Clinical Impression  Patient alert, oriented, in good spirits. Reported no pain at start of session, mild complaints of congestion and cough. The patient reported that she lives in a one story home, has family who can assist intermittently, previously independent at baseline with AD, does not drive (needs assistance for errands/groceries).    SpO2 monitored intermittently during session, pt did desat to mid 80s with ambulation, able to return to high 80s/low 90s with PLB and sitting rest breaks. Supine to sit with CGA and HOB, returned to supine with minA for LE management, pt utilizes a sheet at baseline to lift LEs. Sit <> stand several times this session as well, CGA/supervision with quad cane. The patient ambulated ~23ft with CGA and quad cane, 2 standing rest breaks to address SOB. Pt in bed with all needs in reach at end of session.  Overall the patient demonstrated deficits (see "PT Problem List") that impede the patient's functional abilities, safety, and mobility and would benefit from skilled PT intervention. Patient's main limitation of mobility was her oxygen/respiratory status, RN notified of spO2 readings. Recommendation is HHPT with intermittent supervision.      Follow Up Recommendations Home health PT;Supervision - Intermittent    Equipment Recommendations  None recommended by PT    Recommendations for Other Services       Precautions / Restrictions Precautions Precautions: Fall Restrictions Weight Bearing Restrictions: No      Mobility  Bed  Mobility Overal bed mobility: Needs Assistance Bed Mobility: Supine to Sit;Sit to Supine     Supine to sit: Min guard;HOB elevated Sit to supine: Min assist   General bed mobility comments: supine to sit provided handheld assist. return to supine minA with LE, pt utilized a sheet to lift LEs  Transfers Overall transfer level: Needs assistance Equipment used: Quad cane Transfers: Sit to/from Stand Sit to Stand: Min guard            Ambulation/Gait Ambulation/Gait assistance: Counsellor (Feet): 85 Feet Assistive device: Quad cane   Gait velocity: decreased   General Gait Details: gait altered due to significant edema, wide stance, decreased stride length bilaterally. SOB/face redness noted, ~68ft, spO2 reading 82%. Able to recover to 87% with standing rest break and PLB.  Stairs            Wheelchair Mobility    Modified Rankin (Stroke Patients Only)       Balance Overall balance assessment: Needs assistance Sitting-balance support: Feet supported Sitting balance-Leahy Scale: Fair       Standing balance-Leahy Scale: Good                               Pertinent Vitals/Pain Pain Assessment: No/denies pain    Home Living Family/patient expects to be discharged to:: Private residence Living Arrangements: Alone Available Help at Discharge: Family;Available PRN/intermittently Type of Home: House Home Access: Other (comment) Entrance Stairs-Rails: None Entrance Stairs-Number of Steps: 1 step into kitchen Home Layout: One level Home Equipment: Cane - quad;Walker - standard  Prior Function Level of Independence: Independent with assistive device(s)         Comments: pt does not drive, family assists with errands/picking up groceries     Hand Dominance        Extremity/Trunk Assessment   Upper Extremity Assessment Upper Extremity Assessment: Overall WFL for tasks assessed    Lower Extremity Assessment Lower  Extremity Assessment: Generalized weakness(difficulty assessing due to significant lyphedema)    Cervical / Trunk Assessment Cervical / Trunk Assessment: Normal  Communication   Communication: No difficulties  Cognition Arousal/Alertness: Awake/alert Behavior During Therapy: WFL for tasks assessed/performed Overall Cognitive Status: Within Functional Limits for tasks assessed                                        General Comments      Exercises Other Exercises Other Exercises: Patient able to perform sit <> stand x3 this session in prep for bed change Other Exercises: Pt able to stand and lean against wall x38minutes in prep for re-arranging room/removing wet sheets   Assessment/Plan    PT Assessment Patient needs continued PT services  PT Problem List Decreased mobility;Decreased range of motion;Decreased activity tolerance       PT Treatment Interventions DME instruction;Therapeutic exercise;Gait training;Balance training;Stair training;Functional mobility training;Neuromuscular re-education;Therapeutic activities;Patient/family education    PT Goals (Current goals can be found in the Care Plan section)  Acute Rehab PT Goals Patient Stated Goal: to go home PT Goal Formulation: With patient Time For Goal Achievement: 04/27/19 Potential to Achieve Goals: Good    Frequency Min 2X/week   Barriers to discharge        Co-evaluation               AM-PAC PT "6 Clicks" Mobility  Outcome Measure Help needed turning from your back to your side while in a flat bed without using bedrails?: A Little Help needed moving from lying on your back to sitting on the side of a flat bed without using bedrails?: A Little Help needed moving to and from a bed to a chair (including a wheelchair)?: A Little Help needed standing up from a chair using your arms (e.g., wheelchair or bedside chair)?: A Little Help needed to walk in hospital room?: A Little Help needed  climbing 3-5 steps with a railing? : A Lot 6 Click Score: 17    End of Session Equipment Utilized During Treatment: Gait belt Activity Tolerance: Patient tolerated treatment well;Patient limited by fatigue Patient left: in bed;with call bell/phone within reach;with bed alarm set;with nursing/sitter in room Nurse Communication: Mobility status PT Visit Diagnosis: Muscle weakness (generalized) (M62.81);Unsteadiness on feet (R26.81);Other abnormalities of gait and mobility (R26.89);Pain;Difficulty in walking, not elsewhere classified (R26.2) Pain - Right/Left: Left Pain - part of body: Leg    Time: 1459-1540 PT Time Calculation (min) (ACUTE ONLY): 41 min   Charges:   PT Evaluation $PT Eval Moderate Complexity: 1 Mod PT Treatments $Therapeutic Exercise: 23-37 mins        Lieutenant Diego PT, DPT 4:16 PM,04/13/19 [phone]

## 2019-04-13 NOTE — Progress Notes (Signed)
[place] [facility] NAME: [name]    MR#:  [id number]  DATE OF BIRTH:  07-Sep-1955  SUBJECTIVE:  CHIEF COMPLAINT: pt denies any shortness of breath or tightness today has history of asthma, last episode of her asthma exacerbation was 1 month ago approximately Getting echocardiogram at bedside today  ch lympedema and redness and discharge are better today when compared to yesterday  REVIEW OF SYSTEMS:  CONSTITUTIONAL: No fever, fatigue or weakness.  EYES: No blurred or double vision.  EARS, NOSE, AND THROAT: No tinnitus or ear pain.  RESPIRATORY: No cough, shortness of breath,no  wheezing, no hemoptysis.  CARDIOVASCULAR: No chest pain, orthopnea, edema.  GASTROINTESTINAL: No nausea, vomiting, diarrhea or abdominal pain.  GENITOURINARY: No dysuria, hematuria.  ENDOCRINE: No polyuria, nocturia,  HEMATOLOGY: No anemia, easy bruising or bleeding SKIN: Worsening of the redness and discharge of left leg MUSCULOSKELETAL: No joint pain or arthritis.   NEUROLOGIC: No tingling, numbness, weakness.  PSYCHIATRY: No anxiety or depression.   DRUG ALLERGIES:   Allergies  Allergen Reactions  . Ace Inhibitors Hives and Swelling  . Ibuprofen Hives and Swelling    VITALS:  Blood pressure 132/70, pulse 86, temperature 98.7 F (37.1 C), temperature source Oral, resp. rate 20, height 5' 3.5" (1.613 m), weight (!) 176.9 kg, SpO2 97 %.  PHYSICAL EXAMINATION:  GENERAL:  63 y.o.-year-old patient lying in the bed with no acute distress.  Morbidly obese EYES: Pupils equal, round, reactive to light and accommodation. No scleral icterus. Extraocular muscles intact.  HEENT: Head atraumatic, normocephalic. Oropharynx and nasopharynx clear.  NECK:  Supple, no jugular venous distention. No thyroid enlargement, no tenderness.  LUNGS: Moderate breath sounds bilaterally, minimal diffuse wheezing, no rales,rhonchi or crepitation. No use of accessory  muscles of respiration.  CARDIOVASCULAR: S1, S2 normal. No murmurs, rubs, or gallops.  ABDOMEN: Soft, nontender, nondistended. Bowel sounds present.  EXTREMITIES: No pedal edema, cyanosis, or clubbing.  NEUROLOGIC: Cranial nerves II through XII are intact. Muscle strength 5/5 in all extremities. Sensation intact. Gait not checked.  PSYCHIATRIC: The patient is alert and oriented x 3.  SKIN: Left leg with Ace wrap.  Chronic lymphedema.   LABORATORY PANEL:   CBC Recent Labs  Lab 04/10/19 1044  WBC 5.3  HGB 11.8*  HCT 38.9  PLT 186   ------------------------------------------------------------------------------------------------------------------  Chemistries  Recent Labs  Lab 04/10/19 1044  NA 138  K 4.3  CL 104  CO2 26  GLUCOSE 92  BUN 17  CREATININE 0.89  CALCIUM 8.7*  AST 22  ALT 17  ALKPHOS 107  BILITOT 0.8   ------------------------------------------------------------------------------------------------------------------  Cardiac Enzymes No results for input(s): TROPONINI in the last 168 hours. ------------------------------------------------------------------------------------------------------------------  RADIOLOGY:  Dg Chest Port 1 View  Result Date: 04/12/2019 CLINICAL DATA:  Shortness of breath EXAM: PORTABLE CHEST 1 VIEW COMPARISON:  03/25/2019 FINDINGS: Cardiomegaly. Pulmonary vascular prominence without acute airspace opacity. The visualized skeletal structures are unremarkable. IMPRESSION: Cardiomegaly. Pulmonary vascular prominence without overt edema or acute abnormality of the lungs. Electronically Signed   By: Eddie Candle M.D.   On: 04/12/2019 17:00    EKG:   Orders placed or performed during the hospital encounter of 03/25/19  . EKG 12-Lead  . EKG 12-Lead  . EKG    ASSESSMENT AND PLAN:   63 year old female with morbid obesity and chronic lymphedema seen at the wound care clinic today who presents from the clinic due to increased erythema  of the left leg  and worsening drainage with recent cultures positive for Pseudomonas and Klebsiella.  #Acute respiratory distress exacerbation of reactive airway disease Solu-Medrol will be tapered, bronchodilator treatments DuoNebs every 6 hours Chest x-ray-no acute findings Echocardiogram done results are pending  #.  Left lower extremity cellulitis on top of chronic lymphedema and morbid obesity: Previous cultures were positive for Pseudomonas and Klebsiella, patient is started on cefepime as per previous sensitivities on wound culture Elevate leg by 4 pillows Seen by vascular surgery recommending wound care  Wound care is following  Lower extremity Doppler negative DVT  Patient gets yearly ABIs and last ABI was within normal limits Outpatient follow-up with vascular surgery as scheduled in October for yearly surveillance  #: CPAP as tolerated  #.  Chronic lower extremity lymphedema Will need frequent wrapping Continue atenolol   # .  Morbid obesity: Encouraged weight loss as tolerated PT evaluation    All the records are reviewed and case discussed with Care Management/Social Workerr. Management plans discussed with the patient, family and they are in agreement.  CODE STATUS: fc  TOTAL TIME TAKING CARE OF THIS PATIENT: 35  minutes.   POSSIBLE D/C IN 1  DAYS, DEPENDING ON CLINICAL CONDITION.  Note: This dictation was prepared with Dragon dictation along with smaller phrase technology. Any transcriptional errors that result from this process are unintentional.   Nicholes Mango M.D on 04/13/2019 at 12:09 PM  Between 7am to 6pm - Pager - [phone] After 6pm go to [email or web address] - password [email or web address] [facility]  [place] [facility]  Office  [phone]  CC: Primary care physician; Ranae Plumber, [place]

## 2019-04-13 NOTE — Progress Notes (Signed)
*  PRELIMINARY RESULTS* Echocardiogram 2D Echocardiogram has been performed.  [name] 04/13/2019, 11:26 AM

## 2019-04-14 LAB — CREATININE, SERUM
Creatinine, Ser: 0.82 mg/dL (ref 0.44–1.00)
GFR calc Af Amer: >60 mL/min (ref >60)
GFR calc non Af Amer: >60 mL/min (ref >60)

## 2019-04-14 MED ORDER — PREDNISONE 10 MG (21) PO TBPK: 10.0000 mg | ORAL_TABLET | Freq: Every day | ORAL | 0 refills | Status: DC

## 2019-04-14 MED ORDER — CEFDINIR 300 MG PO CAPS
300.0000 mg | ORAL_CAPSULE | Freq: Two times a day (BID) | ORAL | Status: DC
  Filled 2019-04-14 (×2): qty 1

## 2019-04-14 MED ORDER — POLYETHYLENE GLYCOL 3350 17 G PO PACK: 17.0000 g | PACK | Freq: Every day | ORAL | 0 refills | Status: DC | PRN

## 2019-04-14 MED ORDER — GUAIFENESIN-DM 100-10 MG/5ML PO SYRP: 10.0000 mL | ORAL_SOLUTION | Freq: Four times a day (QID) | ORAL | 0 refills | Status: DC | PRN

## 2019-04-14 MED ORDER — CEFDINIR 300 MG PO CAPS: 300.0000 mg | ORAL_CAPSULE | Freq: Two times a day (BID) | ORAL | 0 refills | Status: DC

## 2019-04-14 MED ORDER — GUAIFENESIN-DM 100-10 MG/5ML PO SYRP: 10.0000 mL | ORAL_SOLUTION | Freq: Four times a day (QID) | ORAL | Status: DC | PRN

## 2019-04-14 NOTE — Discharge Summary (Signed)
[facility] [place] NAME: [name]    MR#:  [id number]  DATE OF BIRTH:  07-Apr-1956  DATE OF ADMISSION:  04/10/2019 ADMITTING PHYSICIAN: Bettey Costa, MD  DATE OF DISCHARGE:  04/14/19   PRIMARY CARE PHYSICIAN: Ranae Plumber, PA    ADMISSION DIAGNOSIS:  Cellulitis of left lower extremity [id number]  DISCHARGE DIAGNOSIS:  Active Problems:   Cellulitis  Asthma exacerbation  SECONDARY DIAGNOSIS:   Past Medical History:  Diagnosis Date  . Arthritis   . Asthma   . Enlarged heart   . Hypertension   . Lymphedema   . Sleep apnea   . Spinal stenosis     HOSPITAL COURSE:   63 year old female with morbid obesity and chronic lymphedema seen at the wound care clinic today who presents from the clinic due to increased erythema of the left leg and worsening drainage with recent cultures positive for Pseudomonas and Klebsiella.  #Acute respiratory distress exacerbation of reactive airway disease Solu-Medrol  tapered, bronchodilator treatments DuoNebs every 6 hours provided patient likely improved Chest x-ray-no acute findings Echocardiogram -55 to 60% ejection fraction Discharge home with prednisone taper continue Proventil as needed Robitussin-DM as needed for cough   #. Left lower extremity cellulitis on top of chronic lymphedema and morbid obesity: Previous cultures were positive for Pseudomonas and Klebsiella, patient is started on cefepime as per previous sensitivities on wound culture Clinically improved significantly will discharge with Omnicef Elevate leg by 4 pillows Seen by vascular surgery recommending wound care as recommended and continue outpatient vascular follow-up and outpatient wound care Wound care is following  Lower extremity Doppler negative DVT  Patient gets yearly ABIs and last ABI was within normal limits Outpatient follow-up with vascular surgery as scheduled in October for yearly  surveillance  #: CPAP astolerated  #. Chronic lower extremity lymphedema Will need frequent wrapping Continue atenolol   # . Morbid obesity: Encouraged weight loss as tolerated PT evaluation-recommending home health PT with intermittent supervision Wound care at home Discussed with case manager   DISCHARGE CONDITIONS:   Stable   CONSULTS OBTAINED:     PROCEDURES none  DRUG ALLERGIES:   Allergies  Allergen Reactions  . Ace Inhibitors Hives and Swelling  . Ibuprofen Hives and Swelling    DISCHARGE MEDICATIONS:   Allergies as of 04/14/2019      Reactions   Ace Inhibitors Hives, Swelling   Ibuprofen Hives, Swelling      Medication List    STOP taking these medications   lidocaine 5 % Commonly known as: Lidoderm   sulfamethoxazole-trimethoprim 800-160 MG tablet Commonly known as: BACTRIM DS     TAKE these medications   acetaminophen 650 MG CR tablet Commonly known as: TYLENOL Take 650-1,300 mg by mouth every 8 (eight) hours as needed for pain.   albuterol 108 (90 Base) MCG/ACT inhaler Commonly known as: VENTOLIN HFA Inhale 2 puffs into the lungs every 6 (six) hours as needed for wheezing or shortness of breath.   atenolol 50 MG tablet Commonly known as: TENORMIN Take 50 mg by mouth daily.   cefdinir 300 MG capsule Commonly known as: OMNICEF Take 1 capsule (300 mg total) by mouth every 12 (twelve) hours.   cetirizine 10 MG tablet Commonly known as: ZYRTEC Take 10 mg by mouth daily as needed for allergies.   guaiFENesin-dextromethorphan 100-10 MG/5ML syrup Commonly known as: ROBITUSSIN DM Take 10 mLs by mouth every 6 (six) hours as needed for cough.  polyethylene glycol 17 g packet Commonly known as: MIRALAX / GLYCOLAX Take 17 g by mouth daily as needed for mild constipation.   predniSONE 10 MG (21) Tbpk tablet Commonly known as: STERAPRED UNI-PAK 21 TAB Take 1 tablet (10 mg total) by mouth daily. Take 6 tablets by mouth for 1 day  followed by  5 tablets by mouth for 1 day followed by  4 tablets by mouth for 1 day followed by  3 tablets by mouth for 1 day followed by  2 tablets by mouth for 1 day followed by  1 tablet by mouth for a day and stop        DISCHARGE INSTRUCTIONS:  Follow-up with primary care physician in 3 days Follow-up with vascular surgery as scheduled on October 14 Continue wound care Home health PT with intermittent supervision CPAP nightly   DIET:  Cardiac diet  DISCHARGE CONDITION:  Fair  ACTIVITY:  Activity as tolerated  OXYGEN:  Home Oxygen: No.   Oxygen Delivery: room air  DISCHARGE LOCATION:  home   If you experience worsening of your admission symptoms, develop shortness of breath, life threatening emergency, suicidal or homicidal thoughts you must seek medical attention immediately by calling 911 or calling your MD immediately  if symptoms less severe.  You Must read complete instructions/literature along with all the possible adverse reactions/side effects for all the Medicines you take and that have been prescribed to you. Take any new Medicines after you have completely understood and accpet all the possible adverse reactions/side effects.   Please note  You were cared for by a hospitalist during your hospital stay. If you have any questions about your discharge medications or the care you received while you were in the hospital after you are discharged, you can call the unit and asked to speak with the hospitalist on call if the hospitalist that took care of you is not available. Once you are discharged, your primary care physician will handle any further medical issues. Please note that NO REFILLS for any discharge medications will be authorized once you are discharged, as it is imperative that you return to your primary care physician (or establish a relationship with a primary care physician if you do not have one) for your aftercare needs so that they can reassess your  need for medications and monitor your lab values.     Today  Chief Complaint  Patient presents with  . Recurrent Skin Infections   Patient is feeling much better redness of the leg is improving discharge is significantly resolved and shortness of breath is better wants to go home  ROS:  CONSTITUTIONAL: Denies fevers, chills. Denies any fatigue, weakness.  EYES: Denies blurry vision, double vision, eye pain. EARS, NOSE, THROAT: Denies tinnitus, ear pain, hearing loss. RESPIRATORY: Reports some dry cough, denies wheeze, shortness of breath.  CARDIOVASCULAR: Denies chest pain, palpitations, edema.  GASTROINTESTINAL: Denies nausea, vomiting, diarrhea, abdominal pain. Denies bright red blood per rectum. GENITOURINARY: Denies dysuria, hematuria. ENDOCRINE: Denies nocturia or thyroid problems. HEMATOLOGIC AND LYMPHATIC: Denies easy bruising or bleeding. SKIN: Lymphedema, left leg redness is improving MUSCULOSKELETAL: Denies pain in neck, back, shoulder, knees, hips or arthritic symptoms.  NEUROLOGIC: Denies paralysis, paresthesias.  PSYCHIATRIC: Denies anxiety or depressive symptoms.   VITAL SIGNS:  Blood pressure 128/69, pulse 61, temperature (!) 97.5 F (36.4 C), temperature source Oral, resp. rate 20, height 5' 3.5" (1.613 m), weight (!) 176.9 kg, SpO2 96 %.  I/O:    Intake/Output Summary (Last 24  hours) at 04/14/2019 1244 Last data filed at 04/14/2019 0417 Gross per 24 hour  Intake 1907.71 ml  Output 1100 ml  Net 807.71 ml    PHYSICAL EXAMINATION:  GENERAL:  63 y.o.-year-old patient lying in the bed with no acute distress.  EYES: Pupils equal, round, reactive to light and accommodation. No scleral icterus. Extraocular muscles intact.  HEENT: Head atraumatic, normocephalic. Oropharynx and nasopharynx clear.  NECK:  Supple, no jugular venous distention. No thyroid enlargement, no tenderness.  LUNGS: Normal breath sounds bilaterally, no wheezing, rales,rhonchi or crepitation.  No use of accessory muscles of respiration.  CARDIOVASCULAR: S1, S2 normal. No murmurs, rubs, or gallops.  ABDOMEN: Soft, non-tender, non-distended. Bowel sounds present. No organomegaly or mass.  EXTREMITIES: Left leg erythema is improving, minimal discharge, covered with Ace wrap.  Chronic lymphedema NEUROLOGIC: Cranial nerves II through XII are intact. Sensation intact. Gait not checked.  PSYCHIATRIC: The patient is alert and oriented x 3.  SKIN: No obvious rash, lesion, or ulcer.   DATA REVIEW:   CBC Recent Labs  Lab 04/10/19 1044  WBC 5.3  HGB 11.8*  HCT 38.9  PLT 186    Chemistries  Recent Labs  Lab 04/10/19 1044 04/14/19 0424  NA 138  --   K 4.3  --   CL 104  --   CO2 26  --   GLUCOSE 92  --   BUN 17  --   CREATININE 0.89 0.82  CALCIUM 8.7*  --   AST 22  --   ALT 17  --   ALKPHOS 107  --   BILITOT 0.8  --     Cardiac Enzymes No results for input(s): TROPONINI in the last 168 hours.  Microbiology Results  Results for orders placed or performed during the hospital encounter of 04/10/19  SARS CORONAVIRUS 2 (TAT 6-24 HRS) Nasopharyngeal Nasopharyngeal Swab     Status: None   Collection Time: 04/10/19 12:19 PM   Specimen: Nasopharyngeal Swab  Result Value Ref Range Status   SARS Coronavirus 2 NEGATIVE NEGATIVE Final    Comment: (NOTE) SARS-CoV-2 target nucleic acids are NOT DETECTED. The SARS-CoV-2 RNA is generally detectable in upper and lower respiratory specimens during the acute phase of infection. Negative results do not preclude SARS-CoV-2 infection, do not rule out co-infections with other pathogens, and should not be used as the sole basis for treatment or other patient management decisions. Negative results must be combined with clinical observations, patient history, and epidemiological information. The expected result is Negative. Fact Sheet for Patients: SugarRoll.be Fact Sheet for Healthcare  Providers: https://www.woods-mathews.com/ This test is not yet approved or cleared by the [place] FDA and  has been authorized for detection and/or diagnosis of SARS-CoV-2 by FDA under an Emergency Use Authorization (EUA). This EUA will remain  in effect (meaning this test can be used) for the duration of the COVID-19 declaration under Section 56 4(b)(1) of the Act, 21 U.S.C. section 360bbb-3(b)(1), unless the authorization is terminated or revoked sooner. Performed at [facility], [place] [address] [place], [place] [address]   Respiratory Panel by PCR     Status: None   Collection Time: 04/12/19  3:01 PM   Specimen: Nasopharyngeal Swab; Respiratory  Result Value Ref Range Status   Adenovirus NOT DETECTED NOT DETECTED Final   Coronavirus 229E NOT DETECTED NOT DETECTED Final    Comment: (NOTE) The Coronavirus on the Respiratory Panel, DOES NOT test for the novel  Coronavirus (2019 nCoV)  Coronavirus HKU1 NOT DETECTED NOT DETECTED Final   Coronavirus NL63 NOT DETECTED NOT DETECTED Final   Coronavirus OC43 NOT DETECTED NOT DETECTED Final   Metapneumovirus NOT DETECTED NOT DETECTED Final   Rhinovirus / Enterovirus NOT DETECTED NOT DETECTED Final   Influenza A NOT DETECTED NOT DETECTED Final   Influenza A H1 NOT DETECTED NOT DETECTED Final   Influenza A H1 2009 NOT DETECTED NOT DETECTED Final   Influenza A H3 NOT DETECTED NOT DETECTED Final   Influenza B NOT DETECTED NOT DETECTED Final   Parainfluenza Virus 1 NOT DETECTED NOT DETECTED Final   Parainfluenza Virus 2 NOT DETECTED NOT DETECTED Final   Parainfluenza Virus 3 NOT DETECTED NOT DETECTED Final   Parainfluenza Virus 4 NOT DETECTED NOT DETECTED Final   Respiratory Syncytial Virus NOT DETECTED NOT DETECTED Final   Bordetella pertussis NOT DETECTED NOT DETECTED Final   Chlamydophila pneumoniae NOT DETECTED NOT DETECTED Final   Mycoplasma pneumoniae NOT DETECTED NOT DETECTED Final    Comment: Performed at  [facility], [place] [address] [place], [place] [address]    RADIOLOGY:  US Venous Img Lower Bilateral  Result Date: 04/10/2019 CLINICAL DATA:  Bilateral leg edema. EXAM: BILATERAL LOWER EXTREMITY VENOUS DOPPLER ULTRASOUND TECHNIQUE: Gray-scale sonography with graded compression, as well as color Doppler and duplex ultrasound were performed to evaluate the lower extremity deep venous systems from the level of the common femoral vein and including the common femoral, femoral, profunda femoral, popliteal and calf veins including the posterior tibial, peroneal and gastrocnemius veins when visible. The superficial great saphenous vein was also interrogated. Spectral Doppler was utilized to evaluate flow at rest and with distal augmentation maneuvers in the common femoral, femoral and popliteal veins. COMPARISON:  None. FINDINGS: RIGHT LOWER EXTREMITY Common Femoral Vein: No evidence of thrombus. Normal compressibility, respiratory phasicity and response to augmentation. Saphenofemoral Junction: No evidence of thrombus. Normal compressibility and flow on color Doppler imaging. Profunda Femoral Vein: No evidence of thrombus. Normal compressibility and flow on color Doppler imaging. Femoral Vein: No evidence of thrombus. Normal compressibility, respiratory phasicity and response to augmentation. Popliteal Vein: No evidence of thrombus. Normal compressibility, respiratory phasicity and response to augmentation. Calf Veins: No evidence of thrombus. Normal compressibility and flow on color Doppler imaging. Superficial Great Saphenous Vein: No evidence of thrombus. Normal compressibility. Venous Reflux:  Not evaluated Other Findings:  Lower extremity edema is noted. LEFT LOWER EXTREMITY Common Femoral Vein: No evidence of thrombus. Normal compressibility, respiratory phasicity and response to augmentation. Saphenofemoral Junction: No evidence of thrombus. Normal compressibility and flow on color Doppler imaging.  Profunda Femoral Vein: No evidence of thrombus. Normal compressibility and flow on color Doppler imaging. Femoral Vein: No evidence of thrombus. Normal compressibility, respiratory phasicity and response to augmentation. Popliteal Vein: No evidence of thrombus. Normal compressibility, respiratory phasicity and response to augmentation. Calf Veins: No evidence of thrombus. Normal compressibility and flow on color Doppler imaging. Superficial Great Saphenous Vein: No evidence of thrombus. Normal compressibility. Venous Reflux:  Not evaluated Other Findings:  Lower extremity edema is noted. IMPRESSION: 1. No DVT.  The calf veins were poorly evaluated. 2. Nonspecific lower extremity edema is noted. Electronically Signed   By: Constance Holster M.D.   On: 04/10/2019 14:09   Dg Chest Port 1 View  Result Date: 04/12/2019 CLINICAL DATA:  Shortness of breath EXAM: PORTABLE CHEST 1 VIEW COMPARISON:  03/25/2019 FINDINGS: Cardiomegaly. Pulmonary vascular prominence without acute airspace opacity. The visualized skeletal structures are unremarkable. IMPRESSION:  Cardiomegaly. Pulmonary vascular prominence without overt edema or acute abnormality of the lungs. Electronically Signed   By: Eddie Candle M.D.   On: 04/12/2019 17:00    EKG:   Orders placed or performed during the hospital encounter of 03/25/19  . EKG 12-Lead  . EKG 12-Lead  . EKG      Management plans discussed with the patient, family and they are in agreement.  CODE STATUS:     Code Status Orders  (From admission, onward)         Start     Ordered   04/10/19 1326  Full code  Continuous     04/10/19 1325        Code Status History    Date Active Date Inactive Code Status Order ID Comments User Context   11/03/2018 1715 11/05/2018 1956 Full Code [id number]  Sela Hua, MD Inpatient   04/17/2018 0905 04/19/2018 1649 Full Code [id number]  Arta Silence, MD Inpatient   08/04/2016 0246 08/07/2016 1731 Full Code [id number]  Lance Coon, MD Inpatient   01/23/2015 2114 01/28/2015 1717 Full Code [id number]  Hower, Aaron Mose, MD ED   [facility] Care Planning Activity      TOTAL TIME TAKING CARE OF THIS PATIENT: 45  minutes.   Note: This dictation was prepared with Dragon dictation along with smaller phrase technology. Any transcriptional errors that result from this process are unintentional.   @[facility] @  on 04/14/2019 at 12:44 PM  Between 7am to 6pm - Pager - [phone]  After 6pm go to [email or web address] - password [email or web address] [facility]  [place] [facility]  Office  [phone]  CC: Primary care physician; Ranae Plumber, [place]

## 2019-04-14 NOTE — TOC Transition Note (Signed)
Transition of Care [facility]) - CM/SW Discharge Note   Patient Details  Name: [name] MRN: [id number] Date of Birth: 1955-12-15  Transition of [facility] CM/SW Contact:  Katrina Stack, RN Phone Number: 04/14/2019, 3:10 PM   Clinical Narrative:   Patient for discharge home today with home health RN for wound care to lower extremity and PT. Has received hoe health in the past with [facility]. Has a cane and has chronic home cpap.  Her son lives with her. She is obese with lymphedema.  She is followed by [facility] wound care center.  Patient has been performing own wound care in between wound care center visits. All of the home health agencies contacted that provide service to this area declined to accept referral due to payor source and or inability to staff for RN.  Asked primary nurse to assess patient performing her wound care and demonstrates ability to perform, would  home health agency accept for physical therapy.  All have responded "no" . CM also noticed in the physical therapy evaluation note from 10/5 that patient has oxygen saturations that dropped "into the 80's" with exertion.  Asked primary nurse to perform home oxygen assessment.  She does not qualify for continuous home 02. Patient's sister will  transport home and patient states she will be able to get into her house. Updated attending.Patient was observed to performed her wound care independently.  Patient is agreeable for [facility] referral. CM spoke with Kathreen Cornfield and faxed referral. She is to call patient 10/7.  Provided patient with phone number for [facility].  Instructed to to call agency if doe not receive call.       Patient Goals and CMS Choice        Discharge Placement                       Discharge Plan and Services                                     Social Determinants of Health (SDOH) Interventions     Readmission Risk Interventions No flowsheet data found.

## 2019-04-14 NOTE — Progress Notes (Signed)
[name] to be D/C'd Home per MD order.  Discussed prescriptions and follow up appointments with the patient. Prescriptions given to patient, medication list explained in detail. Pt verbalized understanding.  Allergies as of 04/14/2019       Reactions   Ace Inhibitors Hives, Swelling   Ibuprofen Hives, Swelling        Medication List     STOP taking these medications    lidocaine 5 % Commonly known as: Lidoderm   sulfamethoxazole-trimethoprim 800-160 MG tablet Commonly known as: BACTRIM DS       TAKE these medications    acetaminophen 650 MG CR tablet Commonly known as: TYLENOL Take 650-1,300 mg by mouth every 8 (eight) hours as needed for pain.   albuterol 108 (90 Base) MCG/ACT inhaler Commonly known as: VENTOLIN HFA Inhale 2 puffs into the lungs every 6 (six) hours as needed for wheezing or shortness of breath.   atenolol 50 MG tablet Commonly known as: TENORMIN Take 50 mg by mouth daily.   cefdinir 300 MG capsule Commonly known as: OMNICEF Take 1 capsule (300 mg total) by mouth every 12 (twelve) hours.   cetirizine 10 MG tablet Commonly known as: ZYRTEC Take 10 mg by mouth daily as needed for allergies.   guaiFENesin-dextromethorphan 100-10 MG/5ML syrup Commonly known as: ROBITUSSIN DM Take 10 mLs by mouth every 6 (six) hours as needed for cough.   polyethylene glycol 17 g packet Commonly known as: MIRALAX / GLYCOLAX Take 17 g by mouth daily as needed for mild constipation.   predniSONE 10 MG (21) Tbpk tablet Commonly known as: STERAPRED UNI-PAK 21 TAB Take 1 tablet (10 mg total) by mouth daily. Take 6 tablets by mouth for 1 day followed by  5 tablets by mouth for 1 day followed by  4 tablets by mouth for 1 day followed by  3 tablets by mouth for 1 day followed by  2 tablets by mouth for 1 day followed by  1 tablet by mouth for a day and stop        Vitals:   04/14/19 0535 04/14/19 1352  BP: 128/69 (!) 145/74  Pulse: 61 68  Resp: 20  20  Temp: (!) 97.5 F (36.4 C) 98.2 F (36.8 C)  SpO2: 96% 94%    Skin clean, dry and intact without evidence of skin break down, no evidence of skin tears noted. IV catheter discontinued intact. Site without signs and symptoms of complications. Dressing and pressure applied. Pt denies pain at this time. No complaints noted.  An After Visit Summary was printed and given to the patient. Patient escorted via [place], and D/C home via private auto.  Kenefic A Tuan Tippin

## 2019-04-14 NOTE — Progress Notes (Signed)
SATURATION QUALIFICATIONS: (This note is used to comply with regulatory documentation for home oxygen)  Patient Saturations on Room Air at Rest =96  Patient Saturations on Room Air while Ambulating 89 -93

## 2019-04-14 NOTE — Discharge Instructions (Signed)
Follow-up with primary care physician in 3 days Follow-up with vascular surgery as scheduled on October 14 Continue wound care Home health PT with intermittent supervision CPAP nightly

## 2019-04-17 ENCOUNTER — Other Ambulatory Visit: Payer: Self-pay

## 2019-04-17 ENCOUNTER — Encounter: Payer: Medicaid Other | Admitting: Physician Assistant

## 2019-04-17 DIAGNOSIS — Z720 Tobacco use: Secondary | ICD-10-CM | POA: Diagnosis not present

## 2019-04-17 DIAGNOSIS — J45909 Unspecified asthma, uncomplicated: Secondary | ICD-10-CM | POA: Diagnosis not present

## 2019-04-17 DIAGNOSIS — Z09 Encounter for follow-up examination after completed treatment for conditions other than malignant neoplasm: Secondary | ICD-10-CM | POA: Diagnosis not present

## 2019-04-17 DIAGNOSIS — G4730 Sleep apnea, unspecified: Secondary | ICD-10-CM | POA: Diagnosis not present

## 2019-04-17 DIAGNOSIS — L97812 Non-pressure chronic ulcer of other part of right lower leg with fat layer exposed: Secondary | ICD-10-CM | POA: Diagnosis not present

## 2019-04-17 DIAGNOSIS — Z6841 Body Mass Index (BMI) 40.0 and over, adult: Secondary | ICD-10-CM | POA: Diagnosis not present

## 2019-04-17 DIAGNOSIS — Z886 Allergy status to analgesic agent status: Secondary | ICD-10-CM | POA: Diagnosis not present

## 2019-04-17 DIAGNOSIS — L97822 Non-pressure chronic ulcer of other part of left lower leg with fat layer exposed: Secondary | ICD-10-CM | POA: Diagnosis not present

## 2019-04-17 DIAGNOSIS — E669 Obesity, unspecified: Secondary | ICD-10-CM | POA: Diagnosis not present

## 2019-04-17 DIAGNOSIS — Z881 Allergy status to other antibiotic agents status: Secondary | ICD-10-CM | POA: Diagnosis not present

## 2019-04-17 DIAGNOSIS — I10 Essential (primary) hypertension: Secondary | ICD-10-CM | POA: Diagnosis not present

## 2019-04-17 DIAGNOSIS — I890 Lymphedema, not elsewhere classified: Secondary | ICD-10-CM | POA: Diagnosis present

## 2019-04-17 NOTE — Progress Notes (Addendum)
[name], [name] ([id number]) Visit Report for 04/17/2019 Chief Complaint Document Details Patient Name: [name], [name]. Date of Service: 04/17/2019 9:15 AM Medical Record Number: [id number] Patient Account Number: [id number] Date of Birth/Sex: 04/28/1956 (63 y.o. F) Treating RN: Montey Hora Primary Care Provider: Tandy Gaw Other Clinician: Referring Provider: Tandy Gaw Treating Provider/Extender: Melburn Hake, HOYT Weeks in Treatment: 8 Information Obtained from: Patient Chief Complaint Left LE Ulcer Electronic Signature(s) Signed: 04/17/2019 9:55:56 AM By: Worthy Keeler PA-C Entered By: Worthy Keeler on 04/17/2019 09:55:55 [name], [name] ([id number]) -------------------------------------------------------------------------------- HPI Details Patient Name: [name]. Date of Service: 04/17/2019 9:15 AM Medical Record Number: [id number] Patient Account Number: [id number] Date of Birth/Sex: [birth year] (63 y.o. F) Treating RN: Montey Hora Primary Care Provider: Tandy Gaw Other Clinician: Referring Provider: Tandy Gaw Treating Provider/Extender: STONE III, HOYT Weeks in Treatment: 8 History of Present Illness HPI Description: The patient is a 63 year old female with history of hypertension and a long-standing history of bilateral lower extremity lymphedema (first presented on 4/2) . She has had open ulcers in the past which have always responded to compression therapy. She had briefly been to a lymphedema clinic in the past which helped her at the time. this time around she stopped treatment of her lymphedema pumps approximately 2 weeks ago because of some pain in the knees and then noticed the right leg getting worse. She was seen by her PCP who put her on clindamycin 4 times a day 2 days ago. The patient has seen [facility] and Dr. Delana Meyer had seen her last year where a vascular study including venous and arterial duplex studies were within normal limits. he  had recommended compression stockings and lymphedema pumps and the patient has been using this in about 2 weeks ago. She is known to be diabetic but in the past few time she's gone to her primary care doctor her hemoglobin A1c has been normal. 02/11/2015 - after her last visit she took my advice and went to the ER regarding the progressive cellulitis of her right lower extremity and she was admitted between July 17 and 22nd. She received IV antibiotics and then was sent home on a course of steroid-induced and oral antibiotics. She has improved much since then. 02/17/2015 -- she has been doing fine and the weeping of her legs has remarkably gone down. She has no fresh issues. READMISSION 01/15/18 This patient was given this clinic before most recently in 2016 seen by Dr. Con Memos. She has massive bilateral lymphedema and over the last 2 months this had weeping edema out of the left leg. She has compression pumps but her compliance with these has been minimal. She has [facility] they've been using TCA/ABDs/kerlix under an Ace wrap.she has had recent problems with cellulitis. She was apparently seen in the ER and 12/23/17 and given clindamycin. She was then followed by her primary doctor and given doxycycline and Keflex. The pain seems to have settled down. In April 2018 the patient had arterial studies done at [facility]. This showed triphasic waveforms throughout the right leg and mostly triphasic waveforms on the left except for monophasic at the posterior tibial artery distally. She was not felt to have evidence of right lower extremity arterial stenosis or significant problems on the left side. 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 She also had a right lower extremity venous Doppler in January 2018 this was limited by the patient's body habitus and lymphedema. Most of the  proximal veins were not visualized The patient presents with an area of  denuded skin on the anterior medial part of the left calf. There is weeping edema fluid here. 01/22/18; the patient has somewhat better edema control using her compression pumps twice a day and as a result she has much better epithelialization on the left anterior calf area. Only a small open area remains. 01/29/18; the patient has been compliant with her compression pumps. Both the areas on her calf that healed. The remaining area on the left anterior leg is fully epithelialized Readmission: 02/20/2019 upon evaluation today patient presents for reevaluation due to issues that she is having with the bilateral lower extremities. She actually has wounds open on both legs. On the right she has an area in the crease of her leg on the right around the knee region which is actually draining quite a bit and actually has some fungal type appearance to it. She has been on nystatin powder that seems to have helped to some degree. In regard to the left lower extremity this is actually in the [place]. ([id number]) lower portion of her leg closer to the ankle and again is continuing to drain as well unfortunately. There does not appear to be any signs of active infection at this time which is good news. No fevers, chills, nausea, vomiting, or diarrhea. She tells me that since she was seen last year she is actually been doing quite well for the most part with regard to her lower extremities. Unfortunately she now is experiencing a little bit more drainage at this time. She is concerned about getting this under control so that it does not get significantly worse. 02/27/2019 on evaluation today patient appears to be doing somewhat better in regard to her bilateral lower extremity wounds. She has been tolerating the dressing changes without complication. Fortunately there is no signs of active infection at this point. No fevers, chills, nausea, vomiting, or diarrhea. She did get her dressing supplies which is  excellent news she was extremely excited to get these. She also got paperwork from [employer] for their financial assistance program where they may be able to help her out in the future if needed with supplies at discounted prices. 03/06/2019 on evaluation today patient appears to be doing a little worse with regard to both areas of weeping on her bilateral lower extremities. This is around the right medial knee and just above the left ankle. With that being said she is unfortunately not doing as well as I would like to see. I feel like she may need to potentially go see someone at the lymphedema clinic as the wraps that she needs or even beyond what we can do here at the wound care center. She really does not have wounds she just has open areas of weeping that are causing some difficulty for her. Subsequently because of this and the moisture I am concerned about the potential for infection I am going to likely give her a prophylactic antibiotic today, Keflex, just to be on the safe side. Nonetheless again there is no obvious signs of active infection at this time. 03/13/2019 on evaluation today patient appears to be doing well with regard to her bilateral lower extremities where she has been weeping compared to even last week's evaluation. I see some areas of new skin growth which is excellent and overall I am very pleased with how things seem to be progressing. No fevers, chills, nausea, vomiting, or diarrhea. 03/20/2019 on evaluation  today patient unfortunately is continuing to have issues with significant edema of the left lower extremity. Her right side seems to be doing much better. Unfortunately her left side is showing increased weeping of the lower portion of her leg. This is quite unfortunate obviously we were hoping to get her into the lymphedema clinic they really do not seem to when I see her how if she is draining. Despite the fact this is really not wound related but more lymphedema  weeping related. Nonetheless I do not know that this can be helpful for her to even go for that appointment since again I am not sure there is much that they would actually do at this point. We may need to try a 4 layer compression wrap as best we can on her leg. She is on the Augmentin currently although I am still concerned about whether or not there could be potentially something going on infection wise I would obtain a culture though I understand is not the best being that is a surface culture I just 1 to make sure I do not seem to be missing anything. 03/27/2019 on evaluation today patient appears to be doing much better in regard to the left lower extremity compared to last week. Last week she had tremendous weeping which I think was subsequent to infection now she seems to be doing much better and very pleased. This is not completely healed but there is a lot of new skin growth and it has dried out quite a bit. Overall I think that we are doing well with how things are moving along at this time. No fevers, chills, nausea, vomiting, or diarrhea. 04/03/2019 evaluation today patient appears to be doing a little worse this week compared to last time I saw her. I think this may be due to the fact that she is having issues with not being able to sleep in her bed at least not until last night. She is therefore been in a lift chair and subsequently has also had issues with not been able to use her pumps since she could not get in bed. With that being said the patient overall seems to be doing okay I do think I may want extend the antibiotic for a little bit longer at least until we can see if her edema and her weeping gets better and if it is then obviously I can always discontinue the antibiotics as of next week however I want her to continue to have it over the next week. 04/10/2019 on evaluation today patient unfortunately is still doing poorly with regard to her left lower extremity. Her right is  all things considering doing fairly well. On the left however she continues to have spreading of the area of infection and weeping which appears to be even a larger surface area than noted last week. She did have a positive culture for Pseudomonas in particular which seems to have been of concern she still has green/yellow discharge consistent with Pseudomonas and subsequently a tremendous amount of it. This has me obviously still concerned about the infection not really clearing up despite the fact that on culture it appears the Cipro should have been a good option for treating this. I think she may at this point need IV antibiotics since things are not doing better I do not want to get worse and cause sepsis. She is in agreement with the plan and believes as well that she likely does need to go to the hospital for  IV vancomycin. Or something of the like depending on what the recommendation is from the ER. 04/17/2019 on evaluation today patient appears to be doing excellent in regard to her lower extremity on the left. She was in the hospital for several days from when I sent her last we saw her until just this past Tuesday. Fortunately her drainage is significantly improved and in fact is mostly clear. There is just a couple small areas that may still drain a little bit she states KINZA, LUCHS. ([id number]) that the [facility] they prescribed for her at discharge she went picked up from pharmacy and got home but has not been able to find it since. She is looked everywhere. She is wondering if I will replace that for her today I will be more than happy to do that. Electronic Signature(s) Signed: 04/17/2019 10:04:20 AM By: Worthy Keeler PA-C Entered By: Worthy Keeler on 04/17/2019 10:04:19 [name] ([id number]) -------------------------------------------------------------------------------- Physical Exam Details Patient Name: [name], [name] Date of Service: 04/17/2019 9:15  AM Medical Record Number: [id number] Patient Account Number: [id number] Date of Birth/Sex: 1955/10/13 (63 y.o. F) Treating RN: Montey Hora Primary Care Provider: Tandy Gaw Other Clinician: Referring Provider: Tandy Gaw Treating Provider/Extender: STONE III, HOYT Weeks in Treatment: 8 Constitutional Obese and well-hydrated in no acute distress. Respiratory normal breathing without difficulty. clear to auscultation bilaterally. Cardiovascular regular rate and rhythm with normal S1, S2. Psychiatric this patient is able to make decisions and demonstrates good insight into disease process. Alert and Oriented x 3. pleasant and cooperative. Notes Patient's wound beds currently are just several small open areas of weeping and this again is minimal and does not appear to be even weeping that much to be perfectly honest. Fortunately she seems to be doing excellent and I am very pleased compared to last time I saw her. The hospital stay was definitely good for her. Electronic Signature(s) Signed: 04/17/2019 10:05:00 AM By: Worthy Keeler PA-C Entered By: Worthy Keeler on 04/17/2019 10:05:00 [name] ([id number]) -------------------------------------------------------------------------------- Physician Orders Details Patient Name: [name] Date of Service: 04/17/2019 9:15 AM Medical Record Number: [id number] Patient Account Number: [id number] Date of Birth/Sex: 1956/06/11 (63 y.o. F) Treating RN: Montey Hora Primary Care Provider: Tandy Gaw Other Clinician: Referring Provider: Tandy Gaw Treating Provider/Extender: STONE III, HOYT Weeks in Treatment: 8 Verbal / Phone Orders: No Diagnosis Coding ICD-10 Coding Code Description I89.0 Lymphedema, not elsewhere classified L97.822 Non-pressure chronic ulcer of other part of left lower leg with fat layer exposed L97.812 Non-pressure chronic ulcer of other part of right lower leg with fat layer exposed Wound  Cleansing Wound #3 Left,Medial Lower Leg o Dial antibacterial soap, wash wounds, rinse and pat dry prior to dressing wounds o May Shower, gently pat wound dry prior to applying new dressing. Primary Wound Dressing Wound #3 Left,Medial Lower Leg o Silver Alginate Secondary Dressing Wound #3 Left,Medial Lower Leg o Conform/Kerlix - ABD pad if needed Dressing Change Frequency Wound #3 Left,Medial Lower Leg o Change Dressing Monday, Wednesday, Friday Follow-up Appointments o Return Appointment in 2 weeks. o Nurse Visit as needed Edema Control Wound #3 Left,Medial Lower Leg o Other: - ACE Wrap or 3rd layer of 4 layer wrap Patient Medications Allergies: ibuprofen, ACE Inhibitors Notifications Medication Indication Start End cefdinir 04/17/2019 DOSE 1 - oral 300 mg capsule - 1 capsule oral taken 2 times a day for 10 days Electronic Signature(s) KAHDIJAH, WARSTLER ([id number]) Signed: 04/17/2019 10:12:31 AM By: Worthy Keeler  PA-C Entered By: Worthy Keeler on 04/17/2019 10:12:31 [name], [name] ([id number]) -------------------------------------------------------------------------------- Problem List Details Patient Name: BREALE, WILKENS. Date of Service: 04/17/2019 9:15 AM Medical Record Number: [id number] Patient Account Number: [id number] Date of Birth/Sex: Jan 21, 1956 (63 y.o. F) Treating RN: Montey Hora Primary Care Provider: Tandy Gaw Other Clinician: Referring Provider: Tandy Gaw Treating Provider/Extender: STONE III, HOYT Weeks in Treatment: 8 Active Problems ICD-10 Evaluated Encounter Code Description Active Date Today Diagnosis I89.0 Lymphedema, not elsewhere classified 02/20/2019 No Yes L97.822 Non-pressure chronic ulcer of other part of left lower leg with 02/20/2019 No Yes fat layer exposed L97.812 Non-pressure chronic ulcer of other part of right lower leg 02/20/2019 No Yes with fat layer exposed Inactive Problems Resolved  Problems Electronic Signature(s) Signed: 04/17/2019 9:55:48 AM By: Worthy Keeler PA-C Entered By: Worthy Keeler on 04/17/2019 09:55:48 Demas, [name] ([id number]) -------------------------------------------------------------------------------- Progress Note Details Patient Name: [name]. Date of Service: 04/17/2019 9:15 AM Medical Record Number: [id number] Patient Account Number: [id number] Date of Birth/Sex: 16-Dec-1955 (63 y.o. F) Treating RN: Montey Hora Primary Care Provider: Tandy Gaw Other Clinician: Referring Provider: Tandy Gaw Treating Provider/Extender: STONE III, HOYT Weeks in Treatment: 8 Subjective Chief Complaint Information obtained from Patient Left LE Ulcer History of Present Illness (HPI) The patient is a 63 year old female with history of hypertension and a long-standing history of bilateral lower extremity lymphedema (first presented on 4/2) . She has had open ulcers in the past which have always responded to compression therapy. She had briefly been to a lymphedema clinic in the past which helped her at the time. this time around she stopped treatment of her lymphedema pumps approximately 2 weeks ago because of some pain in the knees and then noticed the right leg getting worse. She was seen by her PCP who put her on clindamycin 4 times a day 2 days ago. The patient has seen [facility] and Dr. Delana Meyer had seen her last year where a vascular study including venous and arterial duplex studies were within normal limits. he had recommended compression stockings and lymphedema pumps and the patient has been using this in about 2 weeks ago. She is known to be diabetic but in the past few time she's gone to her primary care doctor her hemoglobin A1c has been normal. 02/11/2015 - after her last visit she took my advice and went to the ER regarding the progressive cellulitis of her right lower extremity and she was admitted between July 17 and 22nd. She  received IV antibiotics and then was sent home on a course of steroid-induced and oral antibiotics. She has improved much since then. 02/17/2015 -- she has been doing fine and the weeping of her legs has remarkably gone down. She has no fresh issues. READMISSION 01/15/18 This patient was given this clinic before most recently in 2016 seen by Dr. Con Memos. She has massive bilateral lymphedema and over the last 2 months this had weeping edema out of the left leg. She has compression pumps but her compliance with these has been minimal. She has [facility] they've been using TCA/ABDs/kerlix under an Ace wrap.she has had recent problems with cellulitis. She was apparently seen in the ER and 12/23/17 and given clindamycin. She was then followed by her primary doctor and given doxycycline and Keflex. The pain seems to have settled down. In April 2018 the patient had arterial studies done at [facility]. This showed triphasic waveforms throughout the right leg and mostly triphasic waveforms on the  left except for monophasic at the posterior tibial artery distally. She was not felt to have evidence of right lower extremity arterial stenosis or significant problems on the left side. 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 She also had a right lower extremity venous Doppler in January 2018 this was limited by the patient's body habitus and lymphedema. Most of the proximal veins were not visualized The patient presents with an area of denuded skin on the anterior medial part of the left calf. There is weeping edema fluid here. 01/22/18; the patient has somewhat better edema control using her compression pumps twice a day and as a result she has much better epithelialization on the left anterior calf area. Only a small open area remains. 01/29/18; the patient has been compliant with her compression pumps. Both the areas on her calf that healed. The remaining area  on the left anterior leg is fully epithelialized [name], [name]. ([id number]) Readmission: 02/20/2019 upon evaluation today patient presents for reevaluation due to issues that she is having with the bilateral lower extremities. She actually has wounds open on both legs. On the right she has an area in the crease of her leg on the right around the knee region which is actually draining quite a bit and actually has some fungal type appearance to it. She has been on nystatin powder that seems to have helped to some degree. In regard to the left lower extremity this is actually in the lower portion of her leg closer to the ankle and again is continuing to drain as well unfortunately. There does not appear to be any signs of active infection at this time which is good news. No fevers, chills, nausea, vomiting, or diarrhea. She tells me that since she was seen last year she is actually been doing quite well for the most part with regard to her lower extremities. Unfortunately she now is experiencing a little bit more drainage at this time. She is concerned about getting this under control so that it does not get significantly worse. 02/27/2019 on evaluation today patient appears to be doing somewhat better in regard to her bilateral lower extremity wounds. She has been tolerating the dressing changes without complication. Fortunately there is no signs of active infection at this point. No fevers, chills, nausea, vomiting, or diarrhea. She did get her dressing supplies which is excellent news she was extremely excited to get these. She also got paperwork from [employer] for their financial assistance program where they may be able to help her out in the future if needed with supplies at discounted prices. 03/06/2019 on evaluation today patient appears to be doing a little worse with regard to both areas of weeping on her bilateral lower extremities. This is around the right medial knee and just above the left  ankle. With that being said she is unfortunately not doing as well as I would like to see. I feel like she may need to potentially go see someone at the lymphedema clinic as the wraps that she needs or even beyond what we can do here at the wound care center. She really does not have wounds she just has open areas of weeping that are causing some difficulty for her. Subsequently because of this and the moisture I am concerned about the potential for infection I am going to likely give her a prophylactic antibiotic today, Keflex, just to be on the safe side. Nonetheless again there is no obvious signs of active  infection at this time. 03/13/2019 on evaluation today patient appears to be doing well with regard to her bilateral lower extremities where she has been weeping compared to even last week's evaluation. I see some areas of new skin growth which is excellent and overall I am very pleased with how things seem to be progressing. No fevers, chills, nausea, vomiting, or diarrhea. 03/20/2019 on evaluation today patient unfortunately is continuing to have issues with significant edema of the left lower extremity. Her right side seems to be doing much better. Unfortunately her left side is showing increased weeping of the lower portion of her leg. This is quite unfortunate obviously we were hoping to get her into the lymphedema clinic they really do not seem to when I see her how if she is draining. Despite the fact this is really not wound related but more lymphedema weeping related. Nonetheless I do not know that this can be helpful for her to even go for that appointment since again I am not sure there is much that they would actually do at this point. We may need to try a 4 layer compression wrap as best we can on her leg. She is on the Augmentin currently although I am still concerned about whether or not there could be potentially something going on infection wise I would obtain a culture though I  understand is not the best being that is a surface culture I just 1 to make sure I do not seem to be missing anything. 03/27/2019 on evaluation today patient appears to be doing much better in regard to the left lower extremity compared to last week. Last week she had tremendous weeping which I think was subsequent to infection now she seems to be doing much better and very pleased. This is not completely healed but there is a lot of new skin growth and it has dried out quite a bit. Overall I think that we are doing well with how things are moving along at this time. No fevers, chills, nausea, vomiting, or diarrhea. 04/03/2019 evaluation today patient appears to be doing a little worse this week compared to last time I saw her. I think this may be due to the fact that she is having issues with not being able to sleep in her bed at least not until last night. She is therefore been in a lift chair and subsequently has also had issues with not been able to use her pumps since she could not get in bed. With that being said the patient overall seems to be doing okay I do think I may want extend the antibiotic for a little bit longer at least until we can see if her edema and her weeping gets better and if it is then obviously I can always discontinue the antibiotics as of next week however I want her to continue to have it over the next week. 04/10/2019 on evaluation today patient unfortunately is still doing poorly with regard to her left lower extremity. Her right is all things considering doing fairly well. On the left however she continues to have spreading of the area of infection and weeping which appears to be even a larger surface area than noted last week. She did have a positive culture for Pseudomonas in particular which seems to have been of concern she still has green/yellow discharge consistent with Pseudomonas and subsequently a tremendous amount of it. This has me obviously still concerned  about the infection not really clearing up despite  the fact that on culture it appears the Cipro should have been a good option for treating this. I think she may at this point need IV antibiotics since things are not doing better I do not want to get worse and cause sepsis. She is in agreement [name], [name]. ([id number]) with the plan and believes as well that she likely does need to go to the hospital for IV vancomycin. Or something of the like depending on what the recommendation is from the ER. 04/17/2019 on evaluation today patient appears to be doing excellent in regard to her lower extremity on the left. She was in the hospital for several days from when I sent her last we saw her until just this past Tuesday. Fortunately her drainage is significantly improved and in fact is mostly clear. There is just a couple small areas that may still drain a little bit she states that the [facility] they prescribed for her at discharge she went picked up from pharmacy and got home but has not been able to find it since. She is looked everywhere. She is wondering if I will replace that for her today I will be more than happy to do that. Patient History Information obtained from Patient. Family History Cancer - Father, Diabetes - Father, Heart Disease - Mother, Hypertension - Mother,Father, Seizures - Mother, Stroke - Mother, Thyroid Problems - Mother,Siblings, No family history of Hereditary Spherocytosis, Kidney Disease, Lung Disease, Tuberculosis. Social History Former smoker - uses snuff, Marital Status - Single, Alcohol Use - Rarely, Drug Use - No History, Caffeine Use - Daily. 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 Denies history of Aspiration, Chronic Obstructive Pulmonary Disease (COPD), Pneumothorax Cardiovascular Patient has history of Hypertension Denies history of Angina, Arrhythmia, Congestive Heart Failure, Coronary Artery Disease, Deep Vein Thrombosis, Hypotension, Myocardial Infarction, Peripheral Arterial Disease, Peripheral Venous Disease, Phlebitis, Vasculitis Gastrointestinal Denies history of Cirrhosis , Colitis, Crohn s, Hepatitis A, Hepatitis B, Hepatitis C Endocrine Denies history of Type I Diabetes, Type II Diabetes Genitourinary Denies history of End Stage Renal Disease Immunological Denies history of Lupus Erythematosus, Raynaud s, Scleroderma Integumentary (Skin) Denies history of History of Burn, History of pressure wounds Musculoskeletal Patient has history of Gout, Osteoarthritis Denies history of Rheumatoid Arthritis, Osteomyelitis Neurologic Denies history of Dementia, Neuropathy, Quadriplegia, Paraplegia, Seizure Disorder Oncologic Denies history of Received Chemotherapy, Received Radiation Psychiatric Denies history of Anorexia/bulimia, Confinement Anxiety Review of Systems (ROS) Constitutional Symptoms (General Health) ANGELENA, KIRALY. ([id number]) Denies complaints or symptoms of Fatigue, Fever, Chills, Marked Weight Change. Respiratory Denies complaints or symptoms of Chronic or frequent coughs, Shortness of Breath. Cardiovascular Complains or has symptoms of LE edema. Denies complaints or symptoms of Chest pain. Psychiatric Denies complaints or symptoms of Anxiety, Claustrophobia. Objective Constitutional Obese and well-hydrated in no acute distress. Vitals Time Taken: 9:08 AM, Height: 62 in, Weight: 390 lbs, BMI: 71.3, Temperature: 98.1 F, Pulse: 63 bpm, Respiratory Rate: 16 breaths/min, Blood Pressure: 205/90  mmHg. General Notes: Manual BP: 179/97 Respiratory normal breathing without difficulty. clear to auscultation bilaterally. Cardiovascular regular rate and rhythm with normal S1, S2. Psychiatric this patient is able to make decisions and demonstrates good insight into disease process. Alert  and Oriented x 3. pleasant and cooperative. General Notes: Patient's wound beds currently are just several small open areas of weeping and this again is minimal and does not appear to be even weeping that much to be perfectly honest. Fortunately she seems to be doing excellent and I am very pleased compared to last time I saw her. The hospital stay was definitely good for her. Integumentary (Hair, Skin) Wound #3 status is Open. Original cause of wound was Blister. The wound is located on the Left,Medial Lower Leg. The wound measures 0.2cm length x 0.2cm width x 0.2cm depth; 0.031cm^2 area and 0.006cm^3 volume. Assessment Active Problems ICD-10 Lymphedema, not elsewhere classified Non-pressure chronic ulcer of other part of left lower leg with fat layer exposed Non-pressure chronic ulcer of other part of right lower leg with fat layer exposed [name], [name] ([id number]) Plan Wound Cleansing: Wound #3 Left,Medial Lower Leg: Dial antibacterial soap, wash wounds, rinse and pat dry prior to dressing wounds May Shower, gently pat wound dry prior to applying new dressing. Primary Wound Dressing: Wound #3 Left,Medial Lower Leg: Silver Alginate Secondary Dressing: Wound #3 Left,Medial Lower Leg: Conform/Kerlix - ABD pad if needed Dressing Change Frequency: Wound #3 Left,Medial Lower Leg: Change Dressing Monday, Wednesday, Friday Follow-up Appointments: Return Appointment in 2 weeks. Nurse Visit as needed Edema Control: Wound #3 Left,Medial Lower Leg: Other: - ACE Wrap or 3rd layer of 4 layer wrap The following medication(s) was prescribed: cefdinir oral 300 mg capsule 1 1 capsule oral taken 2  times a day for 10 days starting 04/17/2019 1 I would recommend currently that we go ahead and continue with the alginate dressing followed by ABD pads and an Ace wrap that seems to have done very well. That is what she has been doing at home since discharge from the hospital as well. 2. I am also going to suggest that we go ahead and continue to monitor her until everything is completely closed for that reason I would like to recheck in a couple weeks with her. 3. I will go ahead and re-prescribe the cefdinir 300 mg for her and send this into the pharmacy. She knows she will likely have to pay full price for it since she just got it filled but nonetheless she states she knows she needs it as well and she will do so. We will see patient back for reevaluation in 2 weeks here in the clinic. If anything worsens or changes patient will contact our office for additional recommendations. Electronic Signature(s) Signed: 04/17/2019 10:15:32 AM By: Worthy Keeler PA-C Entered By: Worthy Keeler on 04/17/2019 10:15:31 [name] ([id number]) -------------------------------------------------------------------------------- ROS/PFSH Details Patient Name: [name] Date of Service: 04/17/2019 9:15 AM Medical Record Number: [id number] Patient Account Number: [id number] Date of Birth/Sex: [birth year] (63 y.o. 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 Negative for: Aspiration;  Chronic Obstructive Pulmonary Disease (COPD); Pneumothorax Cardiovascular Complaints and Symptoms: Positive for: LE edema Negative for: Chest pain Medical History: Positive for: Hypertension Negative for: Angina; Arrhythmia; Congestive Heart Failure; Coronary Artery Disease; Deep Vein Thrombosis; Hypotension; Myocardial Infarction; Peripheral Arterial Disease;  Peripheral Venous Disease; Phlebitis; Vasculitis Psychiatric Complaints and Symptoms: Negative for: Anxiety; Claustrophobia Medical History: Negative for: Anorexia/bulimia; Confinement Anxiety Eyes Medical History: Positive for: Cataracts Negative for: Glaucoma; Optic Neuritis Ear/Nose/Mouth/Throat Medical History: Negative for: Chronic sinus problems/congestion; Middle ear problems Hematologic/Lymphatic Medical History: Positive for: Lymphedema [name], BODILY. ([id number]) Negative for: Anemia; Hemophilia; Human Immunodeficiency Virus; Sickle Cell Disease Gastrointestinal Medical History: Negative for: Cirrhosis ; Colitis; Crohnos; Hepatitis A; Hepatitis B; Hepatitis C Endocrine Medical History: Negative for: Type I Diabetes; Type II Diabetes Genitourinary Medical History: Negative for: End Stage Renal Disease Immunological Medical History: Negative for: Lupus Erythematosus; Raynaudos; Scleroderma Integumentary (Skin) Medical History: Negative for: History of Burn; History of pressure wounds Musculoskeletal Medical History: Positive for: Gout; Osteoarthritis Negative for: Rheumatoid Arthritis; Osteomyelitis Neurologic Medical History: Negative for: Dementia; Neuropathy; Quadriplegia; Paraplegia; Seizure Disorder Oncologic Medical History: Negative for: Received Chemotherapy; Received Radiation HBO Extended History Items Eyes: Cataracts Immunizations Pneumococcal Vaccine: Received Pneumococcal Vaccination: Yes Implantable Devices No devices added Family and Social History Cancer: Yes - Father;  Diabetes: Yes - Father; Heart Disease: Yes - Mother; Hereditary Spherocytosis: No; Hypertension: Yes - Mother,Father; Kidney Disease: No; Lung Disease: No; Seizures: Yes - Mother; Stroke: Yes - Mother; Thyroid Problems: Yes - Mother,Siblings; Tuberculosis: No; Former smoker - uses snuff; Marital Status - Single; Alcohol Use: Rarely; Drug Use: [name], [name] ([id number]) No History; Caffeine Use: Daily; Financial Concerns: No; Food, Clothing or Shelter Needs: No; Support System Lacking: No; Transportation Concerns: No Physician Affirmation I have reviewed and agree with the above information. Electronic Signature(s) Signed: 04/17/2019 4:36:34 PM By: Montey Hora Signed: 04/17/2019 5:06:41 PM By: Worthy Keeler PA-C Entered By: Worthy Keeler on 04/17/2019 10:04:37 [name] ([id number]) -------------------------------------------------------------------------------- SuperBill Details Patient Name: [name]. Date of Service: 04/17/2019 Medical Record Number: [id number] Patient Account Number: [id number] Date of Birth/Sex: Apr 04, 1956 (63 y.o. F) Treating RN: Montey Hora Primary Care Provider: Tandy Gaw Other Clinician: Referring Provider: Tandy Gaw Treating Provider/Extender: STONE III, HOYT Weeks in Treatment: 8 Diagnosis Coding ICD-10 Codes Code Description I89.0 Lymphedema, not elsewhere classified L97.822 Non-pressure chronic ulcer of other part of left lower leg with fat layer exposed L97.812 Non-pressure chronic ulcer of other part of right lower leg with fat layer exposed Facility Procedures CPT4 Code: [id number] Description: 99213 - WOUND CARE VISIT-LEV 3 EST PT Modifier: Quantity: 1 Physician Procedures CPT4 Code Description: [id number] - WC PHYS LEVEL 4 - EST PT ICD-10 Diagnosis Description I89.0 Lymphedema, not elsewhere classified L97.822 Non-pressure chronic ulcer of other part of left lower leg wit L97.812 Non-pressure chronic ulcer of other   part of right lower leg wi Modifier: h fat layer expos th fat layer expo Quantity: 1 ed sed Electronic Signature(s) Signed: 04/17/2019 10:15:42 AM By: Worthy Keeler PA-C Entered By: Worthy Keeler on 04/17/2019 10:15:41

## 2019-04-17 NOTE — Progress Notes (Signed)
TIFA, BENGTSON ([id number]) Visit Report for 04/17/2019 Arrival Information Details Patient Name: [name], [name]. Date of Service: 04/17/2019 9:15 AM Medical Record Number: [id number] Patient Account Number: [id number] Date of Birth/Sex: 03-18-1956 (63 y.o. F) Treating RN: Cornell Barman Primary Care Yaritzel Stange: Tandy Gaw Other Clinician: Referring Konstance Happel: Tandy Gaw Treating Tyquarius Paglia/Extender: Melburn Hake, HOYT Weeks in Treatment: 8 Visit Information History Since Last Visit Added or deleted any medications: Yes Patient Arrived: Cane Any new allergies or adverse reactions: No Arrival Time: 09:27 Had a fall or experienced change in No Accompanied By: self activities of daily living that may affect Transfer Assistance: None risk of falls: Patient Identification Verified: Yes Signs or symptoms of abuse/neglect since last visito No Secondary Verification Process Completed: Yes Hospitalized since last visit: No Patient Requires Transmission-Based Precautions: No Implantable device outside of the clinic excluding No Patient Has Alerts: No cellular tissue based products placed in the center since last visit: Has Dressing in Place as Prescribed: Yes Pain Present Now: No Electronic Signature(s) Signed: 04/17/2019 4:44:03 PM By: Gretta Cool, BSN, RN, CWS, Kim RN, BSN Entered By: Gretta Cool, BSN, RN, CWS, Kim on 04/17/2019 09:27:48 [name] ([id number]) -------------------------------------------------------------------------------- Clinic Level of Care Assessment Details Patient Name: [name]. Date of Service: 04/17/2019 9:15 AM Medical Record Number: [id number] Patient Account Number: [id number] Date of Birth/Sex: Apr 13, 1956 (63 y.o. F) Treating RN: Montey Hora Primary Care Reva Pinkley: Tandy Gaw Other Clinician: Referring Serenah Mill: Tandy Gaw Treating Hazley Dezeeuw/Extender: STONE III, HOYT Weeks in Treatment: 8 Clinic Level of Care Assessment Items TOOL 4 Quantity  Score []  - Use when only an EandM is performed on FOLLOW-UP visit 0 ASSESSMENTS - Nursing Assessment / Reassessment X - Reassessment of Co-morbidities (includes updates in patient status) 1 10 X- 1 5 Reassessment of Adherence to Treatment Plan ASSESSMENTS - Wound and Skin Assessment / Reassessment X - Simple Wound Assessment / Reassessment - one wound 1 5 []  - 0 Complex Wound Assessment / Reassessment - multiple wounds []  - 0 Dermatologic / Skin Assessment (not related to wound area) ASSESSMENTS - Focused Assessment X - Circumferential Edema Measurements - multi extremities 1 5 []  - 0 Nutritional Assessment / Counseling / Intervention X- 1 5 Lower Extremity Assessment (monofilament, tuning fork, pulses) []  - 0 Peripheral Arterial Disease Assessment (using hand held doppler) ASSESSMENTS - Ostomy and/or Continence Assessment and Care []  - Incontinence Assessment and Management 0 []  - 0 Ostomy Care Assessment and Management (repouching, etc.) PROCESS - Coordination of Care X - Simple Patient / Family Education for ongoing care 1 15 []  - 0 Complex (extensive) Patient / Family Education for ongoing care X- 1 10 Staff obtains Programmer, systems, Records, Test Results / Process Orders []  - 0 Staff telephones HHA, Nursing Homes / Clarify orders / etc []  - 0 Routine Transfer to another Facility (non-emergent condition) []  - 0 Routine Hospital Admission (non-emergent condition) []  - 0 New Admissions / Biomedical engineer / Ordering NPWT, Apligraf, etc. []  - 0 Emergency Hospital Admission (emergent condition) X- 1 10 Simple Discharge Coordination SHANANN, PARRAGA. ([id number]) []  - 0 Complex (extensive) Discharge Coordination PROCESS - Special Needs []  - Pediatric / Minor Patient Management 0 []  - 0 Isolation Patient Management []  - 0 Hearing / Language / Visual special needs []  - 0 Assessment of Community assistance (transportation, D/C planning, etc.) []  - 0 Additional  assistance / Altered mentation []  - 0 Support Surface(s) Assessment (bed, cushion, seat, etc.) INTERVENTIONS - Wound Cleansing / Measurement X - Simple Wound Cleansing -  one wound 1 5 []  - 0 Complex Wound Cleansing - multiple wounds X- 1 5 Wound Imaging (photographs - any number of wounds) []  - 0 Wound Tracing (instead of photographs) X- 1 5 Simple Wound Measurement - one wound []  - 0 Complex Wound Measurement - multiple wounds INTERVENTIONS - Wound Dressings X - Small Wound Dressing one or multiple wounds 1 10 []  - 0 Medium Wound Dressing one or multiple wounds []  - 0 Large Wound Dressing one or multiple wounds []  - 0 Application of Medications - topical []  - 0 Application of Medications - injection INTERVENTIONS - Miscellaneous []  - External ear exam 0 []  - 0 Specimen Collection (cultures, biopsies, blood, body fluids, etc.) []  - 0 Specimen(s) / Culture(s) sent or taken to Lab for analysis []  - 0 Patient Transfer (multiple staff / Civil Service fast streamer / Similar devices) []  - 0 Simple Staple / Suture removal (25 or less) []  - 0 Complex Staple / Suture removal (26 or more) []  - 0 Hypo / Hyperglycemic Management (close monitor of Blood Glucose) []  - 0 Ankle / Brachial Index (ABI) - do not check if billed separately X- 1 5 Vital Signs Howden, Deyja C. ([id number]) Has the patient been seen at the hospital within the last three years: Yes Total Score: 95 Level Of Care: New/Established - Level 3 Electronic Signature(s) Signed: 04/17/2019 4:36:34 PM By: Montey Hora Entered By: Montey Hora on 04/17/2019 09:59:16 Kivi, [name] ([id number]) -------------------------------------------------------------------------------- Encounter Discharge Information Details Patient Name: [name]. Date of Service: 04/17/2019 9:15 AM Medical Record Number: [id number] Patient Account Number: [id number] Date of Birth/Sex: 1956/06/02 (63 y.o. F) Treating RN: Montey Hora Primary  Care Mykaila Blunck: Tandy Gaw Other Clinician: Referring Ambri Miltner: Tandy Gaw Treating Laetitia Schnepf/Extender: STONE III, HOYT Weeks in Treatment: 8 Encounter Discharge Information Items Discharge Condition: Stable Ambulatory Status: Cane Discharge Destination: Home Transportation: Private Auto Accompanied By: self Schedule Follow-up Appointment: Yes Clinical Summary of Care: Electronic Signature(s) Signed: 04/17/2019 4:36:34 PM By: Montey Hora Entered By: Montey Hora on 04/17/2019 10:00:22 [name] ([id number]) -------------------------------------------------------------------------------- Lower Extremity Assessment Details Patient Name: [name] Date of Service: 04/17/2019 9:15 AM Medical Record Number: [id number] Patient Account Number: [id number] Date of Birth/Sex: 1955/11/13 (63 y.o. F) Treating RN: Cornell Barman Primary Care Phinley Schall: Tandy Gaw Other Clinician: Referring Zera Markwardt: Tandy Gaw Treating Dominick Morella/Extender: Worthy Keeler Weeks in Treatment: 8 Electronic Signature(s) Signed: 04/17/2019 4:44:03 PM By: Gretta Cool, BSN, RN, CWS, Kim RN, BSN Entered By: Gretta Cool, BSN, RN, CWS, Kim on 04/17/2019 09:39:29 [name] ([id number]) -------------------------------------------------------------------------------- Multi Wound Chart Details Patient Name: [name]. Date of Service: 04/17/2019 9:15 AM Medical Record Number: [id number] Patient Account Number: [id number] Date of Birth/Sex: 11/08/1955 (63 y.o. F) Treating RN: Montey Hora Primary Care Thimothy Barretta: Tandy Gaw Other Clinician: Referring Kailia Starry: Tandy Gaw Treating Joniyah Mallinger/Extender: STONE III, HOYT Weeks in Treatment: 8 Vital Signs Height(in): 62 Pulse(bpm): 63 Weight(lbs): 390 Blood Pressure(mmHg): 205/90 Body Mass Index(BMI): 71 Temperature(F): 98.1 Respiratory Rate 16 (breaths/min): Photos: [3:No Photos] [N/A:N/A] Wound Location: [3:Left, Medial Lower Leg]  [N/A:N/A] Wounding Event: [3:Blister] [N/A:N/A] Primary Etiology: [3:Lymphedema] [N/A:N/A] Date Acquired: [3:10/08/2018] [N/A:N/A] Weeks of Treatment: [3:8] [N/A:N/A] Wound Status: [3:Open] [N/A:N/A] Measurements L x W x D [3:0.2x0.2x0.2] [N/A:N/A] (cm) Area (cm) : [3:0.031] [N/A:N/A] Volume (cm) : [3:0.006] [N/A:N/A] % Reduction in Area: [3:99.90%] [N/A:N/A] % Reduction in Volume: [3:99.80%] [N/A:N/A] Classification: [3:Full Thickness Without Exposed Support Structures] [N/A:N/A] Treatment Notes Electronic Signature(s) Signed: 04/17/2019 4:36:34 PM By: Montey Hora Entered By: Montey Hora on 04/17/2019  Deming, Hubbard ([id number]) -------------------------------------------------------------------------------- Multi-Disciplinary Care Plan Details Patient Name: ALEXONDRA, GAMBY. Date of Service: 04/17/2019 9:15 AM Medical Record Number: [id number] Patient Account Number: [id number] Date of Birth/Sex: [birth year] (63 y.o. F) Treating RN: Montey Hora Primary Care Jada Kuhnert: Tandy Gaw Other Clinician: Referring Exzavier Ruderman: Tandy Gaw Treating Tahiri Shareef/Extender: STONE III, HOYT Weeks in Treatment: 8 Active Inactive Abuse / Safety / Falls / Self Care Management Nursing Diagnoses: Potential for falls Goals: Patient will remain injury free related to falls Date Initiated: 02/20/2019 Target Resolution Date: 05/16/2019 Goal Status: Active Interventions: Assess fall risk on admission and as needed Notes: Orientation to the Wound Care Program Nursing Diagnoses: Knowledge deficit related to the wound healing center program Goals: Patient/caregiver will verbalize understanding of the [facility] Date Initiated: 02/20/2019 Target Resolution Date: 05/16/2019 Goal Status: Active Interventions: Provide education on orientation to the wound center Notes: Venous Leg Ulcer Nursing Diagnoses: Actual venous Insuffiency (use after diagnosis is  confirmed) Goals: Patient will maintain optimal edema control Date Initiated: 02/20/2019 Target Resolution Date: 05/16/2019 Goal Status: Active Interventions: Assess peripheral edema status every visit. CHELSI, SHIPLEY ([id number]) Compression as ordered Notes: Wound/Skin Impairment Nursing Diagnoses: Impaired tissue integrity Goals: Ulcer/skin breakdown will heal within 14 weeks Date Initiated: 02/20/2019 Target Resolution Date: 05/16/2019 Goal Status: Active Interventions: Assess patient/caregiver ability to obtain necessary supplies Assess patient/caregiver ability to perform ulcer/skin care regimen upon admission and as needed Assess ulceration(s) every visit Notes: Electronic Signature(s) Signed: 04/17/2019 4:36:34 PM By: Montey Hora Entered By: Montey Hora on 04/17/2019 09:57:24 Doty, [name] ([id number]) -------------------------------------------------------------------------------- Pain Assessment Details Patient Name: [name]. Date of Service: 04/17/2019 9:15 AM Medical Record Number: [id number] Patient Account Number: [id number] Date of Birth/Sex: [birth year] (63 y.o. F) Treating RN: Cornell Barman Primary Care Laiza Veenstra: Tandy Gaw Other Clinician: Referring Burel Kahre: Tandy Gaw Treating Lanyia Jewel/Extender: STONE III, HOYT Weeks in Treatment: 8 Active Problems Location of Pain Severity and Description of Pain Patient Has Paino No Site Locations Pain Management and Medication Current Pain Management: Notes Patient denies pain at this time. Electronic Signature(s) Signed: 04/17/2019 4:44:03 PM By: Gretta Cool, BSN, RN, CWS, Kim RN, BSN Entered By: Gretta Cool, BSN, RN, CWS, Kim on 04/17/2019 09:28:09 [name] ([id number]) -------------------------------------------------------------------------------- Patient/Caregiver Education Details Patient Name: [name] Date of Service: 04/17/2019 9:15 AM Medical Record Number: [id number] Patient  Account Number: [id number] Date of Birth/Gender: 12/28/1955 (63 y.o. F) Treating RN: Montey Hora Primary Care Physician: Tandy Gaw Other Clinician: Referring Physician: Tandy Gaw Treating Physician/Extender: Melburn Hake, HOYT Weeks in Treatment: 8 Education Assessment Education Provided To: Patient Education Topics Provided Wound/Skin Impairment: Handouts: Other: wound care as ordered Methods: Demonstration, Explain/Verbal Responses: State content correctly Electronic Signature(s) Signed: 04/17/2019 4:36:34 PM By: Montey Hora Entered By: Montey Hora on 04/17/2019 09:59:35 Matarazzo, [name] ([id number]) -------------------------------------------------------------------------------- Wound Assessment Details Patient Name: [name] Date of Service: 04/17/2019 9:15 AM Medical Record Number: [id number] Patient Account Number: [id number] Date of Birth/Sex: 11/28/1955 (63 y.o. F) Treating RN: Cornell Barman Primary Care Ander Wamser: Tandy Gaw Other Clinician: Referring Dellie Piasecki: Tandy Gaw Treating Zyairah Wacha/Extender: STONE III, HOYT Weeks in Treatment: 8 Wound Status Wound Number: 3 Primary Etiology: Lymphedema Wound Location: Left, Medial Lower Leg Wound Status: Open Wounding Event: Blister Date Acquired: 10/08/2018 Weeks Of Treatment: 8 Clustered Wound: No Wound Measurements Length: (cm) 0.2 Width: (cm) 0.2 Depth: (cm) 0.2 Area: (cm) 0.031 Volume: (cm) 0.006 % Reduction in Area: 99.9% % Reduction in Volume: 99.8% Wound Description Full Thickness Without Exposed Support Classification:  Structures Treatment Notes Wound #3 (Left, Medial Lower Leg) Notes silvercel, abd, kerlix and ace wrap Electronic Signature(s) Signed: 04/17/2019 4:44:03 PM By: Gretta Cool, BSN, RN, CWS, Kim RN, BSN Entered By: Gretta Cool, BSN, RN, CWS, Kim on 04/17/2019 09:38:30 Labra, [name]  ([id number]) -------------------------------------------------------------------------------- Vitals Details Patient Name: [name]. Date of Service: 04/17/2019 9:15 AM Medical Record Number: [id number] Patient Account Number: [id number] Date of Birth/Sex: 19-Jan-1956 (63 y.o. F) Treating RN: Cornell Barman Primary Care Abbie Jablon: Tandy Gaw Other Clinician: Referring Marise Knapper: Tandy Gaw Treating Landy Mace/Extender: STONE III, HOYT Weeks in Treatment: 8 Vital Signs Time Taken: 09:08 Temperature (F): 98.1 Height (in): 62 Pulse (bpm): 63 Weight (lbs): 390 Respiratory Rate (breaths/min): 16 Body Mass Index (BMI): 71.3 Blood Pressure (mmHg): 205/90 Reference Range: 80 - 120 mg / dl Notes Manual BP: 179/97 Electronic Signature(s) Signed: 04/17/2019 4:44:03 PM By: Gretta Cool, BSN, RN, CWS, Kim RN, BSN Entered By: Gretta Cool, BSN, RN, CWS, Kim on 04/17/2019 09:42:41

## 2019-04-20 ENCOUNTER — Other Ambulatory Visit (INDEPENDENT_AMBULATORY_CARE_PROVIDER_SITE_OTHER): Payer: Self-pay | Admitting: Vascular Surgery

## 2019-04-20 DIAGNOSIS — L905 Scar conditions and fibrosis of skin: Secondary | ICD-10-CM

## 2019-04-20 DIAGNOSIS — I890 Lymphedema, not elsewhere classified: Secondary | ICD-10-CM

## 2019-04-22 ENCOUNTER — Encounter (INDEPENDENT_AMBULATORY_CARE_PROVIDER_SITE_OTHER): Payer: Self-pay | Admitting: Nurse Practitioner

## 2019-04-22 ENCOUNTER — Ambulatory Visit (INDEPENDENT_AMBULATORY_CARE_PROVIDER_SITE_OTHER): Payer: Medicaid Other | Admitting: Nurse Practitioner

## 2019-04-22 ENCOUNTER — Other Ambulatory Visit: Payer: Self-pay

## 2019-04-22 ENCOUNTER — Ambulatory Visit (INDEPENDENT_AMBULATORY_CARE_PROVIDER_SITE_OTHER): Payer: Medicaid Other

## 2019-04-22 ENCOUNTER — Encounter (INDEPENDENT_AMBULATORY_CARE_PROVIDER_SITE_OTHER): Payer: Self-pay

## 2019-04-22 VITALS — BP 179/111 | HR 78 | Resp 16 | Ht 63.5 in | Wt 395.0 lb

## 2019-04-22 DIAGNOSIS — Z720 Tobacco use: Secondary | ICD-10-CM

## 2019-04-22 DIAGNOSIS — I890 Lymphedema, not elsewhere classified: Secondary | ICD-10-CM

## 2019-04-22 DIAGNOSIS — I10 Essential (primary) hypertension: Secondary | ICD-10-CM

## 2019-04-23 ENCOUNTER — Ambulatory Visit (INDEPENDENT_AMBULATORY_CARE_PROVIDER_SITE_OTHER): Payer: Medicaid Other | Admitting: Vascular Surgery

## 2019-04-28 ENCOUNTER — Encounter (INDEPENDENT_AMBULATORY_CARE_PROVIDER_SITE_OTHER): Payer: Self-pay | Admitting: Nurse Practitioner

## 2019-04-28 NOTE — Progress Notes (Signed)
SUBJECTIVE:  Patient ID: [name], female    DOB: 1956/06/29, 63 y.o.   MRN: [id number] Chief Complaint  Patient presents with  . Follow-up    HPI  [name] is a 63 y.o. female the presents today for a 1 year follow-up for her lymphedema.  The patient was recently admitted to the hospital for cellulitis.  The cellulitis has resolved however the patient still has multiple wounds which are being treated by the wound center.  The patient states that her swelling has gotten worse due to recent replacement of her lymph pump.  She states that this lymph pump is not as comfortable and says she does not utilize it as much.  Patient does admit she needs to utilize it more as a lymph pump was a very positive factor in her care.  The patient is also not able to be very mobile due to the lymphedema as well as her weight.  However the patient does continue to wear compression wraps and elevate as much as possible.  Patient does use a lymph pump at least once a day.  Past Medical History:  Diagnosis Date  . Arthritis   . Asthma   . Enlarged heart   . Hypertension   . Lymphedema   . Sleep apnea   . Spinal stenosis     Past Surgical History:  Procedure Laterality Date  . COLONOSCOPY WITH PROPOFOL N/A 03/12/2018   Procedure: COLONOSCOPY WITH PROPOFOL;  Surgeon: Manya Silvas, MD;  Location: [facility] ENDOSCOPY;  Service: Endoscopy;  Laterality: N/A;  . NO PAST SURGERIES      Social History   Socioeconomic History  . Marital status: Single    Spouse name: Not on file  . Number of children: Not on file  . Years of education: Not on file  . Highest education level: Not on file  Occupational History  . Not on file  Social Needs  . Financial resource strain: Not on file  . Food insecurity    Worry: Not on file    Inability: Not on file  . Transportation needs    Medical: Not on file    Non-medical: Not on file  Tobacco Use  . Smoking status: Former Research scientist (life sciences)  . Smokeless  tobacco: Current User    Types: Snuff  Substance and Sexual Activity  . Alcohol use: No  . Drug use: No  . Sexual activity: Not on file  Lifestyle  . Physical activity    Days per week: Not on file    Minutes per session: Not on file  . Stress: Not on file  Relationships  . Social Herbalist on phone: Not on file    Gets together: Not on file    Attends religious service: Not on file    Active member of club or organization: Not on file    Attends meetings of clubs or organizations: Not on file    Relationship status: Not on file  . Intimate partner violence    Fear of current or ex partner: Not on file    Emotionally abused: Not on file    Physically abused: Not on file    Forced sexual activity: Not on file  Other Topics Concern  . Not on file  Social History Narrative  . Not on file    Family History  Problem Relation Age of Onset  . Thyroid disease Other   . Breast cancer Maternal Aunt  Allergies  Allergen Reactions  . Ace Inhibitors Hives and Swelling  . Ibuprofen Hives and Swelling     Review of Systems   Review of Systems: Negative Unless Checked Constitutional: [] Weight loss  [] Fever  [] Chills Cardiac: [] Chest pain   []  Atrial Fibrillation  [] Palpitations   [] Shortness of breath when laying flat   [x] Shortness of breath with exertion. [] Shortness of breath at rest Vascular:  [] Pain in legs with walking   [] Pain in legs with standing [] Pain in legs when laying flat   [] Claudication    [] Pain in feet when laying flat    [] History of DVT   [] Phlebitis   [x] Swelling in legs   [] Varicose veins   [x] Non-healing ulcers Pulmonary:   [] Uses home oxygen   [] Productive cough   [] Hemoptysis   [] Wheeze  [] COPD   [x] Asthma Neurologic:  [] Dizziness   [] Seizures  [] Blackouts [] History of stroke   [] History of TIA  [] Aphasia   [] Temporary Blindness   [] Weakness or numbness in arm   [] Weakness or numbness in leg Musculoskeletal:   [] Joint swelling   [] Joint pain    [] Low back pain  []  History of Knee Replacement [] Arthritis [] back Surgeries  []  Spinal Stenosis    Hematologic:  [] Easy bruising  [] Easy bleeding   [] Hypercoagulable state   [] Anemic Gastrointestinal:  [] Diarrhea   [] Vomiting  [] Gastroesophageal reflux/heartburn   [] Difficulty swallowing. [] Abdominal pain Genitourinary:  [] Chronic kidney disease   [] Difficult urination  [] Anuric   [] Blood in urine [] Frequent urination  [] Burning with urination   [] Hematuria Skin:  [] Rashes   [] Ulcers [] Wounds Psychological:  [] History of anxiety   []  History of major depression  []  Memory Difficulties      OBJECTIVE:   Physical Exam  BP (!) 179/111 (BP Location: Left Arm, Patient Position: Sitting, Cuff Size: Large)   Pulse 78   Resp 16   Ht 5' 3.5" (1.613 m)   Wt (!) 395 lb (179.2 kg)   BMI 68.87 kg/m   Gen: WD/WN, NAD Head: [place]/AT, No temporalis wasting.  Ear/Nose/Throat: Hearing grossly intact, nares w/o erythema or drainage Eyes: PER, EOMI, sclera nonicteric.  Neck: Supple, no masses.  No JVD.  Pulmonary:  Good air movement, no use of accessory muscles.  Cardiac: RRR Vascular: 4+ edema bilaterally, unable to palpate pulses due to edema Vessel Right Left  Radial Palpable Palpable  Gastrointestinal: soft, non-distended. No guarding/no peritoneal signs.  Musculoskeletal: Uses cane for ambulation.  No deformity or atrophy.  Neurologic: Pain and light touch intact in extremities.  Symmetrical.  Speech is fluent. Motor exam as listed above. Psychiatric: Judgment intact, Mood & affect appropriate for pt's clinical situation. Dermatologic: No Venous rashes. No Ulcers Noted.  No changes consistent with cellulitis. Lymph : No Cervical lymphadenopathy, no lichenification or skin changes of chronic lymphedema.       ASSESSMENT AND PLAN:  1. Lymphedema  No surgery or intervention at this point in time.    I have reviewed my discussion with the patient regarding lymphedema and why it  causes  symptoms.  Patient will continue wearing graduated compression stockings class 1 (20-30 mmHg) on a daily basis a prescription was given. The patient is reminded to put the stockings on first thing in the morning and removing them in the evening. The patient is instructed specifically not to sleep in the stockings.   In addition, behavioral modification throughout the day will be continued.  This will include frequent elevation (such as in a recliner), use of over  the counter pain medications as needed and exercise such as walking.  I have reviewed systemic causes for chronic edema such as liver, kidney and cardiac etiologies and there does not appear to be any significant changes in these organ systems over the past year.  The patient is under the impression that these organ systems are all stable and unchanged.    The patient will continue aggressive use of the  lymph pump.  This will continue to improve the edema control and prevent sequela such as ulcers and infections.   The patient will follow-up with me in 6 months.    2. Tobacco use Smoking cessation was discussed, 3-10 minutes spent on this topic specifically  3. Essential (primary) hypertension Continue antihypertensive medications as already ordered, these medications have been reviewed and there are no changes at this time.   Current Outpatient Medications on File Prior to Visit  Medication Sig Dispense Refill  . acetaminophen (TYLENOL) 650 MG CR tablet Take 650-1,300 mg by mouth every 8 (eight) hours as needed for pain.    Marland Kitchen albuterol (PROVENTIL HFA;VENTOLIN HFA) 108 (90 BASE) MCG/ACT inhaler Inhale 2 puffs into the lungs every 6 (six) hours as needed for wheezing or shortness of breath.    Marland Kitchen atenolol (TENORMIN) 50 MG tablet Take 50 mg by mouth daily.    . cefdinir (OMNICEF) 300 MG capsule Take 1 capsule (300 mg total) by mouth every 12 (twelve) hours. 7 capsule 0  . cetirizine (ZYRTEC) 10 MG tablet Take 10 mg by mouth daily as  needed for allergies.    Marland Kitchen guaiFENesin-dextromethorphan (ROBITUSSIN DM) 100-10 MG/5ML syrup Take 10 mLs by mouth every 6 (six) hours as needed for cough. 118 mL 0  . polyethylene glycol (MIRALAX / GLYCOLAX) 17 g packet Take 17 g by mouth daily as needed for mild constipation. 14 each 0   No current facility-administered medications on file prior to visit.     There are no Patient Instructions on file for this visit. No follow-ups on file.   Kris Hartmann, NP  This note was completed with Sales executive.  Any errors are purely unintentional.

## 2019-05-01 ENCOUNTER — Other Ambulatory Visit: Payer: Self-pay

## 2019-05-01 ENCOUNTER — Encounter: Payer: Medicaid Other | Admitting: Physician Assistant

## 2019-05-01 DIAGNOSIS — Z09 Encounter for follow-up examination after completed treatment for conditions other than malignant neoplasm: Secondary | ICD-10-CM | POA: Diagnosis not present

## 2019-05-01 NOTE — Progress Notes (Addendum)
KEYMORA, AUBUT ([id number]) Visit Report for 05/01/2019 Chief Complaint Document Details Patient Name: [name], [name]. Date of Service: 05/01/2019 9:30 AM Medical Record Number: [id number] Patient Account Number: [id number] Date of Birth/Sex: Oct 21, 1955 (63 y.o. F) Treating RN: Montey Hora Primary Care Provider: Tandy Gaw Other Clinician: Referring Provider: Tandy Gaw Treating Provider/Extender: Melburn Hake, HOYT Weeks in Treatment: 10 Information Obtained from: Patient Chief Complaint Left LE Ulcer Electronic Signature(s) Signed: 05/01/2019 9:51:21 AM By: Worthy Keeler PA-C Entered By: Worthy Keeler on 05/01/2019 09:51:20 Santini, [name] ([id number]) -------------------------------------------------------------------------------- HPI Details Patient Name: [name]. Date of Service: 05/01/2019 9:30 AM Medical Record Number: [id number] Patient Account Number: [id number] Date of Birth/Sex: 03/13/1956 (63 y.o. F) Treating RN: Montey Hora Primary Care Provider: Tandy Gaw Other Clinician: Referring Provider: Tandy Gaw Treating Provider/Extender: STONE III, HOYT Weeks in Treatment: 10 History of Present Illness HPI Description: The patient is a 63 year old female with history of hypertension and a long-standing history of bilateral lower extremity lymphedema (first presented on 4/2) . She has had open ulcers in the past which have always responded to compression therapy. She had briefly been to a lymphedema clinic in the past which helped her at the time. this time around she stopped treatment of her lymphedema pumps approximately 2 weeks ago because of some pain in the knees and then noticed the right leg getting worse. She was seen by her PCP who put her on clindamycin 4 times a day 2 days ago. The patient has seen [facility] and Dr. Delana Meyer had seen her last year where a vascular study including venous and arterial duplex studies were within normal  limits. he had recommended compression stockings and lymphedema pumps and the patient has been using this in about 2 weeks ago. She is known to be diabetic but in the past few time she's gone to her primary care doctor her hemoglobin A1c has been normal. 02/11/2015 - after her last visit she took my advice and went to the ER regarding the progressive cellulitis of her right lower extremity and she was admitted between July 17 and 22nd. She received IV antibiotics and then was sent home on a course of steroid-induced and oral antibiotics. She has improved much since then. 02/17/2015 -- she has been doing fine and the weeping of her legs has remarkably gone down. She has no fresh issues. READMISSION 01/15/18 This patient was given this clinic before most recently in 2016 seen by Dr. Con Memos. She has massive bilateral lymphedema and over the last 2 months this had weeping edema out of the left leg. She has compression pumps but her compliance with these has been minimal. She has [facility] they've been using TCA/ABDs/kerlix under an Ace wrap.she has had recent problems with cellulitis. She was apparently seen in the ER and 12/23/17 and given clindamycin. She was then followed by her primary doctor and given doxycycline and Keflex. The pain seems to have settled down. In April 2018 the patient had arterial studies done at [facility]. This showed triphasic waveforms throughout the right leg and mostly triphasic waveforms on the left except for monophasic at the posterior tibial artery distally. She was not felt to have evidence of right lower extremity arterial stenosis or significant problems on the left side. 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 She also had a right lower extremity venous Doppler in January 2018 this was limited by the patient's body habitus and lymphedema. Most of the  proximal veins were not visualized The patient presents with  an area of denuded skin on the anterior medial part of the left calf. There is weeping edema fluid here. 01/22/18; the patient has somewhat better edema control using her compression pumps twice a day and as a result she has much better epithelialization on the left anterior calf area. Only a small open area remains. 01/29/18; the patient has been compliant with her compression pumps. Both the areas on her calf that healed. The remaining area on the left anterior leg is fully epithelialized Readmission: 02/20/2019 upon evaluation today patient presents for reevaluation due to issues that she is having with the bilateral lower extremities. She actually has wounds open on both legs. On the right she has an area in the crease of her leg on the right around the knee region which is actually draining quite a bit and actually has some fungal type appearance to it. She has been on nystatin powder that seems to have helped to some degree. In regard to the left lower extremity this is actually in the [place]. ([id number]) lower portion of her leg closer to the ankle and again is continuing to drain as well unfortunately. There does not appear to be any signs of active infection at this time which is good news. No fevers, chills, nausea, vomiting, or diarrhea. She tells me that since she was seen last year she is actually been doing quite well for the most part with regard to her lower extremities. Unfortunately she now is experiencing a little bit more drainage at this time. She is concerned about getting this under control so that it does not get significantly worse. 02/27/2019 on evaluation today patient appears to be doing somewhat better in regard to her bilateral lower extremity wounds. She has been tolerating the dressing changes without complication. Fortunately there is no signs of active infection at this point. No fevers, chills, nausea, vomiting, or diarrhea. She did get her dressing  supplies which is excellent news she was extremely excited to get these. She also got paperwork from [employer] for their financial assistance program where they may be able to help her out in the future if needed with supplies at discounted prices. 03/06/2019 on evaluation today patient appears to be doing a little worse with regard to both areas of weeping on her bilateral lower extremities. This is around the right medial knee and just above the left ankle. With that being said she is unfortunately not doing as well as I would like to see. I feel like she may need to potentially go see someone at the lymphedema clinic as the wraps that she needs or even beyond what we can do here at the wound care center. She really does not have wounds she just has open areas of weeping that are causing some difficulty for her. Subsequently because of this and the moisture I am concerned about the potential for infection I am going to likely give her a prophylactic antibiotic today, Keflex, just to be on the safe side. Nonetheless again there is no obvious signs of active infection at this time. 03/13/2019 on evaluation today patient appears to be doing well with regard to her bilateral lower extremities where she has been weeping compared to even last week's evaluation. I see some areas of new skin growth which is excellent and overall I am very pleased with how things seem to be progressing. No fevers, chills, nausea, vomiting, or diarrhea. 03/20/2019 on evaluation  today patient unfortunately is continuing to have issues with significant edema of the left lower extremity. Her right side seems to be doing much better. Unfortunately her left side is showing increased weeping of the lower portion of her leg. This is quite unfortunate obviously we were hoping to get her into the lymphedema clinic they really do not seem to when I see her how if she is draining. Despite the fact this is really not wound related but more  lymphedema weeping related. Nonetheless I do not know that this can be helpful for her to even go for that appointment since again I am not sure there is much that they would actually do at this point. We may need to try a 4 layer compression wrap as best we can on her leg. She is on the Augmentin currently although I am still concerned about whether or not there could be potentially something going on infection wise I would obtain a culture though I understand is not the best being that is a surface culture I just 1 to make sure I do not seem to be missing anything. 03/27/2019 on evaluation today patient appears to be doing much better in regard to the left lower extremity compared to last week. Last week she had tremendous weeping which I think was subsequent to infection now she seems to be doing much better and very pleased. This is not completely healed but there is a lot of new skin growth and it has dried out quite a bit. Overall I think that we are doing well with how things are moving along at this time. No fevers, chills, nausea, vomiting, or diarrhea. 04/03/2019 evaluation today patient appears to be doing a little worse this week compared to last time I saw her. I think this may be due to the fact that she is having issues with not being able to sleep in her bed at least not until last night. She is therefore been in a lift chair and subsequently has also had issues with not been able to use her pumps since she could not get in bed. With that being said the patient overall seems to be doing okay I do think I may want extend the antibiotic for a little bit longer at least until we can see if her edema and her weeping gets better and if it is then obviously I can always discontinue the antibiotics as of next week however I want her to continue to have it over the next week. 04/10/2019 on evaluation today patient unfortunately is still doing poorly with regard to her left lower extremity. Her  right is all things considering doing fairly well. On the left however she continues to have spreading of the area of infection and weeping which appears to be even a larger surface area than noted last week. She did have a positive culture for Pseudomonas in particular which seems to have been of concern she still has green/yellow discharge consistent with Pseudomonas and subsequently a tremendous amount of it. This has me obviously still concerned about the infection not really clearing up despite the fact that on culture it appears the Cipro should have been a good option for treating this. I think she may at this point need IV antibiotics since things are not doing better I do not want to get worse and cause sepsis. She is in agreement with the plan and believes as well that she likely does need to go to the hospital for  IV vancomycin. Or something of the like depending on what the recommendation is from the ER. 04/17/2019 on evaluation today patient appears to be doing excellent in regard to her lower extremity on the left. She was in the hospital for several days from when I sent her last we saw her until just this past Tuesday. Fortunately her drainage is significantly improved and in fact is mostly clear. There is just a couple small areas that may still drain a little bit she states LIERIN, LISKA. ([id number]) that the [facility] they prescribed for her at discharge she went picked up from pharmacy and got home but has not been able to find it since. She is looked everywhere. She is wondering if I will replace that for her today I will be more than happy to do that. 05/01/2019 on evaluation today patient actually appears to be doing quite well with regard to her lower extremities. She occasionally is having areas that will leak and then heal up mainly when a piece of the fibrotic skin pops off but fortunately she is not having any signs of active infection at this time. Overall she also  really does not have any obvious weeping at this time. I do believe however she really needs some compression wraps and I think this may be a good time to get her back to the lymphedema clinic. Electronic Signature(s) Signed: 05/01/2019 10:35:49 AM By: Worthy Keeler PA-C Entered By: Worthy Keeler on 05/01/2019 10:35:49 Stansel, [name] ([id number]) -------------------------------------------------------------------------------- Physical Exam Details Patient Name: AARYAHI, HOUP C. Date of Service: 05/01/2019 9:30 AM Medical Record Number: [id number] Patient Account Number: [id number] Date of Birth/Sex: October 17, 1955 (63 y.o. F) Treating RN: Montey Hora Primary Care Provider: Tandy Gaw Other Clinician: Referring Provider: Tandy Gaw Treating Provider/Extender: STONE III, HOYT Weeks in Treatment: 10 Constitutional Obese and well-hydrated in no acute distress. Respiratory normal breathing without difficulty. clear to auscultation bilaterally. Cardiovascular regular rate and rhythm with normal S1, S2. Psychiatric this patient is able to make decisions and demonstrates good insight into disease process. Alert and Oriented x 3. pleasant and cooperative. Notes Upon inspection today patient's wound actually appears to be healed at all locations and I really do not see anything currently that seems to be overtly open I can see where there were some areas of leaking but again until she gets her edema under control she is going to continually have these areas unfortunately. Electronic Signature(s) Signed: 05/01/2019 10:44:06 AM By: Worthy Keeler PA-C Entered By: Worthy Keeler on 05/01/2019 10:44:06 Feider, [name] ([id number]) -------------------------------------------------------------------------------- Physician Orders Details Patient Name: [name] Date of Service: 05/01/2019 9:30 AM Medical Record Number: [id number] Patient Account Number: [id number] Date of  Birth/Sex: 1956-03-25 (63 y.o. F) Treating RN: Montey Hora Primary Care Provider: Tandy Gaw Other Clinician: Referring Provider: Tandy Gaw Treating Provider/Extender: STONE III, HOYT Weeks in Treatment: 10 Verbal / Phone Orders: No Diagnosis Coding ICD-10 Coding Code Description I89.0 Lymphedema, not elsewhere classified L97.822 Non-pressure chronic ulcer of other part of left lower leg with fat layer exposed L97.812 Non-pressure chronic ulcer of other part of right lower leg with fat layer exposed Wound Cleansing o Dial antibacterial soap, wash wounds, rinse and pat dry prior to dressing wounds o May Shower, gently pat wound dry prior to applying new dressing. Secondary Dressing o ABD and Kerlix/Conform Dressing Change Frequency o Change dressing every other day. - and as needed Follow-up Appointments o Return Appointment in 2 weeks. - on 11/2 or  11/3 Edema Control o Compression Pump: Use compression pump on left lower extremity for 60 minutes, twice daily. o Compression Pump: Use compression pump on right lower extremity for 60 minutes, twice daily. o Other: - ACE Wraps Electronic Signature(s) Signed: 05/01/2019 5:17:10 PM By: Montey Hora Signed: 05/02/2019 1:07:32 AM By: Worthy Keeler PA-C Entered By: Montey Hora on 05/01/2019 09:59:43 Salvucci, [name] ([id number]) -------------------------------------------------------------------------------- Problem List Details Patient Name: LELIANA, SEHNERT. Date of Service: 05/01/2019 9:30 AM Medical Record Number: [id number] Patient Account Number: [id number] Date of Birth/Sex: 09-10-1955 (63 y.o. F) Treating RN: Montey Hora Primary Care Provider: Tandy Gaw Other Clinician: Referring Provider: Tandy Gaw Treating Provider/Extender: STONE III, HOYT Weeks in Treatment: 10 Active Problems ICD-10 Evaluated Encounter Code Description Active Date Today Diagnosis I89.0 Lymphedema, not  elsewhere classified 02/20/2019 No Yes L97.822 Non-pressure chronic ulcer of other part of left lower leg with 02/20/2019 No Yes fat layer exposed L97.812 Non-pressure chronic ulcer of other part of right lower leg 02/20/2019 No Yes with fat layer exposed Inactive Problems Resolved Problems Electronic Signature(s) Signed: 05/01/2019 9:51:14 AM By: Worthy Keeler PA-C Entered By: Worthy Keeler on 05/01/2019 09:51:14 Hedding, [name] ([id number]) -------------------------------------------------------------------------------- Progress Note Details Patient Name: [name]. Date of Service: 05/01/2019 9:30 AM Medical Record Number: [id number] Patient Account Number: [id number] Date of Birth/Sex: 1956/04/23 (63 y.o. F) Treating RN: Montey Hora Primary Care Provider: Tandy Gaw Other Clinician: Referring Provider: Tandy Gaw Treating Provider/Extender: STONE III, HOYT Weeks in Treatment: 10 Subjective Chief Complaint Information obtained from Patient Left LE Ulcer History of Present Illness (HPI) The patient is a 63 year old female with history of hypertension and a long-standing history of bilateral lower extremity lymphedema (first presented on 4/2) . She has had open ulcers in the past which have always responded to compression therapy. She had briefly been to a lymphedema clinic in the past which helped her at the time. this time around she stopped treatment of her lymphedema pumps approximately 2 weeks ago because of some pain in the knees and then noticed the right leg getting worse. She was seen by her PCP who put her on clindamycin 4 times a day 2 days ago. The patient has seen [facility] and Dr. Delana Meyer had seen her last year where a vascular study including venous and arterial duplex studies were within normal limits. he had recommended compression stockings and lymphedema pumps and the patient has been using this in about 2 weeks ago. She is known to be diabetic but  in the past few time she's gone to her primary care doctor her hemoglobin A1c has been normal. 02/11/2015 - after her last visit she took my advice and went to the ER regarding the progressive cellulitis of her right lower extremity and she was admitted between July 17 and 22nd. She received IV antibiotics and then was sent home on a course of steroid-induced and oral antibiotics. She has improved much since then. 02/17/2015 -- she has been doing fine and the weeping of her legs has remarkably gone down. She has no fresh issues. READMISSION 01/15/18 This patient was given this clinic before most recently in 2016 seen by Dr. Con Memos. She has massive bilateral lymphedema and over the last 2 months this had weeping edema out of the left leg. She has compression pumps but her compliance with these has been minimal. She has [facility] they've been using TCA/ABDs/kerlix under an Ace wrap.she has had recent problems with cellulitis. She was apparently seen in the  ER and 12/23/17 and given clindamycin. She was then followed by her primary doctor and given doxycycline and Keflex. The pain seems to have settled down. In April 2018 the patient had arterial studies done at [facility]. This showed triphasic waveforms throughout the right leg and mostly triphasic waveforms on the left except for monophasic at the posterior tibial artery distally. She was not felt to have evidence of right lower extremity arterial stenosis or significant problems on the left side. 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 She also had a right lower extremity venous Doppler in January 2018 this was limited by the patient's body habitus and lymphedema. Most of the proximal veins were not visualized The patient presents with an area of denuded skin on the anterior medial part of the left calf. There is weeping edema fluid here. 01/22/18; the patient has somewhat better edema control  using her compression pumps twice a day and as a result she has much better epithelialization on the left anterior calf area. Only a small open area remains. 01/29/18; the patient has been compliant with her compression pumps. Both the areas on her calf that healed. The remaining area on the left anterior leg is fully epithelialized GLENN, BUSSARD. ([id number]) Readmission: 02/20/2019 upon evaluation today patient presents for reevaluation due to issues that she is having with the bilateral lower extremities. She actually has wounds open on both legs. On the right she has an area in the crease of her leg on the right around the knee region which is actually draining quite a bit and actually has some fungal type appearance to it. She has been on nystatin powder that seems to have helped to some degree. In regard to the left lower extremity this is actually in the lower portion of her leg closer to the ankle and again is continuing to drain as well unfortunately. There does not appear to be any signs of active infection at this time which is good news. No fevers, chills, nausea, vomiting, or diarrhea. She tells me that since she was seen last year she is actually been doing quite well for the most part with regard to her lower extremities. Unfortunately she now is experiencing a little bit more drainage at this time. She is concerned about getting this under control so that it does not get significantly worse. 02/27/2019 on evaluation today patient appears to be doing somewhat better in regard to her bilateral lower extremity wounds. She has been tolerating the dressing changes without complication. Fortunately there is no signs of active infection at this point. No fevers, chills, nausea, vomiting, or diarrhea. She did get her dressing supplies which is excellent news she was extremely excited to get these. She also got paperwork from [employer] for their financial assistance program where they may  be able to help her out in the future if needed with supplies at discounted prices. 03/06/2019 on evaluation today patient appears to be doing a little worse with regard to both areas of weeping on her bilateral lower extremities. This is around the right medial knee and just above the left ankle. With that being said she is unfortunately not doing as well as I would like to see. I feel like she may need to potentially go see someone at the lymphedema clinic as the wraps that she needs or even beyond what we can do here at the wound care center. She really does not have wounds she just has  open areas of weeping that are causing some difficulty for her. Subsequently because of this and the moisture I am concerned about the potential for infection I am going to likely give her a prophylactic antibiotic today, Keflex, just to be on the safe side. Nonetheless again there is no obvious signs of active infection at this time. 03/13/2019 on evaluation today patient appears to be doing well with regard to her bilateral lower extremities where she has been weeping compared to even last week's evaluation. I see some areas of new skin growth which is excellent and overall I am very pleased with how things seem to be progressing. No fevers, chills, nausea, vomiting, or diarrhea. 03/20/2019 on evaluation today patient unfortunately is continuing to have issues with significant edema of the left lower extremity. Her right side seems to be doing much better. Unfortunately her left side is showing increased weeping of the lower portion of her leg. This is quite unfortunate obviously we were hoping to get her into the lymphedema clinic they really do not seem to when I see her how if she is draining. Despite the fact this is really not wound related but more lymphedema weeping related. Nonetheless I do not know that this can be helpful for her to even go for that appointment since again I am not sure there is much that  they would actually do at this point. We may need to try a 4 layer compression wrap as best we can on her leg. She is on the Augmentin currently although I am still concerned about whether or not there could be potentially something going on infection wise I would obtain a culture though I understand is not the best being that is a surface culture I just 1 to make sure I do not seem to be missing anything. 03/27/2019 on evaluation today patient appears to be doing much better in regard to the left lower extremity compared to last week. Last week she had tremendous weeping which I think was subsequent to infection now she seems to be doing much better and very pleased. This is not completely healed but there is a lot of new skin growth and it has dried out quite a bit. Overall I think that we are doing well with how things are moving along at this time. No fevers, chills, nausea, vomiting, or diarrhea. 04/03/2019 evaluation today patient appears to be doing a little worse this week compared to last time I saw her. I think this may be due to the fact that she is having issues with not being able to sleep in her bed at least not until last night. She is therefore been in a lift chair and subsequently has also had issues with not been able to use her pumps since she could not get in bed. With that being said the patient overall seems to be doing okay I do think I may want extend the antibiotic for a little bit longer at least until we can see if her edema and her weeping gets better and if it is then obviously I can always discontinue the antibiotics as of next week however I want her to continue to have it over the next week. 04/10/2019 on evaluation today patient unfortunately is still doing poorly with regard to her left lower extremity. Her right is all things considering doing fairly well. On the left however she continues to have spreading of the area of infection and weeping which appears to be even  a larger surface area than noted last week. She did have a positive culture for Pseudomonas in particular which seems to have been of concern she still has green/yellow discharge consistent with Pseudomonas and subsequently a tremendous amount of it. This has me obviously still concerned about the infection not really clearing up despite the fact that on culture it appears the Cipro should have been a good option for treating this. I think she may at this point need IV antibiotics since things are not doing better I do not want to get worse and cause sepsis. She is in agreement LIBRADA, BOTT. ([id number]) with the plan and believes as well that she likely does need to go to the hospital for IV vancomycin. Or something of the like depending on what the recommendation is from the ER. 04/17/2019 on evaluation today patient appears to be doing excellent in regard to her lower extremity on the left. She was in the hospital for several days from when I sent her last we saw her until just this past Tuesday. Fortunately her drainage is significantly improved and in fact is mostly clear. There is just a couple small areas that may still drain a little bit she states that the [facility] they prescribed for her at discharge she went picked up from pharmacy and got home but has not been able to find it since. She is looked everywhere. She is wondering if I will replace that for her today I will be more than happy to do that. 05/01/2019 on evaluation today patient actually appears to be doing quite well with regard to her lower extremities. She occasionally is having areas that will leak and then heal up mainly when a piece of the fibrotic skin pops off but fortunately she is not having any signs of active infection at this time. Overall she also really does not have any obvious weeping at this time. I do believe however she really needs some compression wraps and I think this may be a good time to get her back to  the lymphedema clinic. Patient History Information obtained from Patient. Family History Cancer - Father, Diabetes - Father, Heart Disease - Mother, Hypertension - Mother,Father, Seizures - Mother, Stroke - Mother, Thyroid Problems - Mother,Siblings, No family history of Hereditary Spherocytosis, Kidney Disease, Lung Disease, Tuberculosis. Social History Former smoker - uses snuff, Marital Status - Single, Alcohol Use - Rarely, Drug Use - No History, Caffeine Use - Daily. 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 Denies history of Aspiration, Chronic Obstructive Pulmonary Disease (COPD), Pneumothorax Cardiovascular Patient has history of Hypertension Denies history of Angina, Arrhythmia, Congestive Heart Failure, Coronary Artery Disease, Deep Vein Thrombosis, Hypotension, Myocardial Infarction, Peripheral Arterial Disease, Peripheral Venous Disease, Phlebitis, Vasculitis Gastrointestinal Denies history of Cirrhosis , Colitis, Crohn s, Hepatitis A, Hepatitis B, Hepatitis C Endocrine Denies history of Type I Diabetes, Type II Diabetes Genitourinary Denies history of End Stage Renal Disease Immunological Denies history of Lupus Erythematosus, Raynaud s, Scleroderma Integumentary (Skin) Denies history of History of Burn, History of pressure wounds Musculoskeletal Patient has history of Gout, Osteoarthritis Denies history of Rheumatoid Arthritis, Osteomyelitis Neurologic Denies history of Dementia, Neuropathy, Quadriplegia, Paraplegia, Seizure Disorder Oncologic Koehl,  ARMEDA CHISLOM ([id number]) Denies history of Received Chemotherapy, Received Radiation Psychiatric Denies history of Anorexia/bulimia, Confinement Anxiety Review of Systems (ROS) Constitutional Symptoms (General Health) Denies complaints or symptoms of Fatigue, Fever, Chills, Marked Weight Change. Respiratory Denies complaints or symptoms of Chronic or frequent coughs, Shortness of Breath. Cardiovascular Complains or has symptoms of LE edema. Denies complaints or symptoms of Chest pain. Psychiatric Denies complaints or symptoms of Anxiety, Claustrophobia. Objective Constitutional Obese and well-hydrated in no acute distress. Vitals Time Taken: 9:30 AM, Height: 62 in, Weight: 390 lbs, BMI: 71.3, Temperature: 97.7 F, Pulse: 88 bpm, Respiratory Rate: 16 breaths/min, Blood Pressure: 175/89 mmHg. Respiratory normal breathing without difficulty. clear to auscultation bilaterally. Cardiovascular regular rate and rhythm with normal S1, S2. Psychiatric this patient is able to make decisions and demonstrates good insight into disease process. Alert and Oriented x 3. pleasant and cooperative. General Notes: Upon inspection today patient's wound actually appears to be healed at all locations and I really do not see anything currently that seems to be overtly open I can see where there were some areas of leaking but again until she gets her edema under control she is going to continually have these areas unfortunately. Integumentary (Hair, Skin) Wound #3 status is Healed - Epithelialized. Original cause of wound was Blister. The wound is located on the Left,Medial Lower Leg. The wound measures 0cm length x 0cm width x 0cm depth; 0cm^2 area and 0cm^3 volume. There is Fat Layer (Subcutaneous Tissue) Exposed exposed. There is no tunneling or undermining noted. There is a medium amount of serous drainage noted. There is medium (34-66%) pink granulation within the wound bed. There is a medium (34-66%)  amount of necrotic tissue within the wound bed including Adherent Slough. Wound #5 status is Healed - Epithelialized. Original cause of wound was Blister. The wound is located on the Right,Medial Lower Leg. The wound measures 0cm length x 0cm width x 0cm depth; 0cm^2 area and 0cm^3 volume. There is Fat Layer (Subcutaneous Tissue) Exposed exposed. There is no tunneling or undermining noted. There is a medium amount of serous drainage noted. There is large (67-100%) red granulation within the wound bed. There is a small (1-33%) amount of necrotic Gross, Hermie C. ([id number]) tissue within the wound bed including Adherent Slough. Assessment Active Problems ICD-10 Lymphedema, not elsewhere classified Non-pressure chronic ulcer of other part of left lower leg with fat layer exposed Non-pressure chronic ulcer of other part of right lower leg with fat layer exposed Plan Wound Cleansing: Dial antibacterial soap, wash wounds, rinse and pat dry prior to dressing wounds May Shower, gently pat wound dry prior to applying new dressing. Secondary Dressing: ABD and Kerlix/Conform Dressing Change Frequency: Change dressing every other day. - and as needed Follow-up Appointments: Return Appointment in 2 weeks. - on 11/2 or 11/3 Edema Control: Compression Pump: Use compression pump on left lower extremity for 60 minutes, twice daily. Compression Pump: Use compression pump on right lower extremity for 60 minutes, twice daily. Other: - ACE Wraps 1. I am in a recommend currently that we go ahead and see about making referral at this point to the lymphedema clinic as I feel like the patient would benefit from this and since she is finally close as far as wounds are concerned I think that it is appropriate to go ahead and make that referral. 2. We will go ahead and plan to see her in 2 weeks just to keep an eye on things depending on if she gets the  lymphedema referral scheduled prior to then she may be  able to cancel the appointment here with [place]. We will see patient back for reevaluation in 2 weeks here in the clinic. If anything worsens or changes patient will contact our office for additional recommendations. Electronic Signature(s) Signed: 05/01/2019 10:44:32 AM By: Worthy Keeler PA-C Entered By: Worthy Keeler on 05/01/2019 10:44:32 Folino, [name] ([id number]) -------------------------------------------------------------------------------- ROS/PFSH Details Patient Name: [name] Date of Service: 05/01/2019 9:30 AM Medical Record Number: [id number] Patient Account Number: [id number] Date of Birth/Sex: 1956-01-02 (63 y.o. 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 Negative for: Aspiration; Chronic Obstructive Pulmonary Disease (COPD); Pneumothorax Cardiovascular Complaints and Symptoms: Positive for: LE edema Negative for: Chest pain Medical History: Positive for: Hypertension Negative for: Angina; Arrhythmia; Congestive Heart Failure; Coronary Artery Disease; Deep Vein Thrombosis; Hypotension; Myocardial Infarction; Peripheral Arterial Disease; Peripheral Venous Disease; Phlebitis; Vasculitis Psychiatric Complaints and Symptoms: Negative for: Anxiety; Claustrophobia Medical History: Negative for: Anorexia/bulimia; Confinement Anxiety Eyes Medical History: Positive for: Cataracts Negative for: Glaucoma; Optic Neuritis Ear/Nose/Mouth/Throat Medical  History: Negative for: Chronic sinus problems/congestion; Middle ear problems Hematologic/Lymphatic Medical History: Positive for: Lymphedema CHERYLLE, SPADAFORE. ([id number]) Negative for: Anemia; Hemophilia; Human Immunodeficiency Virus; Sickle Cell Disease Gastrointestinal Medical History: Negative for: Cirrhosis ; Colitis; Crohnos; Hepatitis A; Hepatitis B; Hepatitis C Endocrine Medical History: Negative for: Type I Diabetes; Type II Diabetes Genitourinary Medical History: Negative for: End Stage Renal Disease Immunological Medical History: Negative for: Lupus Erythematosus; Raynaudos; Scleroderma Integumentary (Skin) Medical History: Negative for: History of Burn; History of pressure wounds Musculoskeletal Medical History: Positive for: Gout; Osteoarthritis Negative for: Rheumatoid Arthritis; Osteomyelitis Neurologic Medical History: Negative for: Dementia; Neuropathy; Quadriplegia; Paraplegia; Seizure Disorder Oncologic Medical History: Negative for: Received Chemotherapy; Received Radiation HBO Extended History Items Eyes: Cataracts Immunizations Pneumococcal Vaccine: Received Pneumococcal Vaccination: Yes Implantable Devices No devices added Family and Social History Cancer: Yes - Father; Diabetes: Yes - Father; Heart Disease: Yes - Mother; Hereditary Spherocytosis: No; Hypertension: Yes - Mother,Father; Kidney Disease: No; Lung Disease: No; Seizures: Yes - Mother; Stroke: Yes - Mother; Thyroid Problems: Yes - Mother,Siblings; Tuberculosis: No; Former smoker - uses snuff; Marital Status - Single; Alcohol Use: Rarely; Drug Use: DANYALE, MAYALL C. ([id number]) No History; Caffeine Use: Daily; Financial Concerns: No; Food, Clothing or Shelter Needs: No; Support System Lacking: No; Transportation Concerns: No Physician Affirmation I have reviewed and agree with the above information. Electronic Signature(s) Signed: 05/01/2019 5:17:10 PM By: Montey Hora Signed:  05/02/2019 1:07:32 AM By: Worthy Keeler PA-C Entered By: Worthy Keeler on 05/01/2019 10:36:13 Liz, [name] ([id number]) -------------------------------------------------------------------------------- SuperBill Details Patient Name: [name]. Date of Service: 05/01/2019 Medical Record Number: [id number] Patient Account Number: [id number] Date of Birth/Sex: 1955-12-29 (63 y.o. F) Treating RN: Montey Hora Primary Care Provider: Tandy Gaw Other Clinician: Referring Provider: Tandy Gaw Treating Provider/Extender: STONE III, HOYT Weeks in Treatment: 10 Diagnosis Coding ICD-10 Codes Code Description I89.0 Lymphedema, not elsewhere classified L97.822 Non-pressure chronic ulcer of other part of left lower leg with fat layer exposed L97.812 Non-pressure chronic ulcer of other part of right lower leg with  fat layer exposed Facility Procedures CPT4 Code: [id number] Description: [id number] - WOUND CARE VISIT-LEV 3 EST PT Modifier: Quantity: 1 Physician Procedures CPT4 Code Description: [id number] - WC PHYS LEVEL 4 - EST PT ICD-10 Diagnosis Description I89.0 Lymphedema, not elsewhere classified L97.822 Non-pressure chronic ulcer of other part of left lower leg wit L97.812 Non-pressure chronic ulcer of other  part of right lower leg wi Modifier: h fat layer expos th fat layer expo Quantity: 1 ed sed Electronic Signature(s) Signed: 05/01/2019 10:44:44 AM By: Worthy Keeler PA-C Entered By: Worthy Keeler on 05/01/2019 10:44:44

## 2019-05-02 NOTE — Progress Notes (Signed)
SUJIN, HECKENDORF ([id number]) Visit Report for 05/01/2019 Arrival Information Details Patient Name: [name], [name]. Date of Service: 05/01/2019 9:30 AM Medical Record Number: [id number] Patient Account Number: [id number] Date of Birth/Sex: [birth year] (63 y.o. F) Treating RN: Army Melia Primary Care Rodnisha Blomgren: Tandy Gaw Other Clinician: Referring Jamair Cato: Tandy Gaw Treating Makynleigh Breslin/Extender: STONE III, HOYT Weeks in Treatment: 10 Visit Information History Since Last Visit Added or deleted any medications: No Patient Arrived: Cane Any new allergies or adverse reactions: No Arrival Time: 09:30 Had a fall or experienced change in No Accompanied By: self activities of daily living that may affect Transfer Assistance: None risk of falls: Patient Requires Transmission-Based Precautions: No Signs or symptoms of abuse/neglect since last visito No Patient Has Alerts: No Hospitalized since last visit: No Has Dressing in Place as Prescribed: Yes Pain Present Now: No Electronic Signature(s) Signed: 05/01/2019 10:17:05 AM By: Army Melia Entered By: Army Melia on 05/01/2019 09:30:16 Swaney, [name] ([id number]) -------------------------------------------------------------------------------- Clinic Level of Care Assessment Details Patient Name: [name]. Date of Service: 05/01/2019 9:30 AM Medical Record Number: [id number] Patient Account Number: [id number] Date of Birth/Sex: January 23, 1956 (63 y.o. F) Treating RN: Montey Hora Primary Care Sacha Radloff: Tandy Gaw Other Clinician: Referring Khalaya Mcgurn: Tandy Gaw Treating Elgie Landino/Extender: STONE III, HOYT Weeks in Treatment: 10 Clinic Level of Care Assessment Items TOOL 4 Quantity Score []  - Use when only an EandM is performed on FOLLOW-UP visit 0 ASSESSMENTS - Nursing Assessment / Reassessment X - Reassessment of Co-morbidities (includes updates in patient status) 1 10 X- 1 5 Reassessment of Adherence to  Treatment Plan ASSESSMENTS - Wound and Skin Assessment / Reassessment X - Simple Wound Assessment / Reassessment - one wound 1 5 []  - 0 Complex Wound Assessment / Reassessment - multiple wounds []  - 0 Dermatologic / Skin Assessment (not related to wound area) ASSESSMENTS - Focused Assessment X - Circumferential Edema Measurements - multi extremities 1 5 []  - 0 Nutritional Assessment / Counseling / Intervention X- 1 5 Lower Extremity Assessment (monofilament, tuning fork, pulses) []  - 0 Peripheral Arterial Disease Assessment (using hand held doppler) ASSESSMENTS - Ostomy and/or Continence Assessment and Care []  - Incontinence Assessment and Management 0 []  - 0 Ostomy Care Assessment and Management (repouching, etc.) PROCESS - Coordination of Care X - Simple Patient / Family Education for ongoing care 1 15 []  - 0 Complex (extensive) Patient / Family Education for ongoing care X- 1 10 Staff obtains Programmer, systems, Records, Test Results / Process Orders []  - 0 Staff telephones HHA, Nursing Homes / Clarify orders / etc []  - 0 Routine Transfer to another Facility (non-emergent condition) []  - 0 Routine Hospital Admission (non-emergent condition) []  - 0 New Admissions / Biomedical engineer / Ordering NPWT, Apligraf, etc. []  - 0 Emergency Hospital Admission (emergent condition) X- 1 10 Simple Discharge Coordination STEWART, GOSSMAN. ([id number]) []  - 0 Complex (extensive) Discharge Coordination PROCESS - Special Needs []  - Pediatric / Minor Patient Management 0 []  - 0 Isolation Patient Management []  - 0 Hearing / Language / Visual special needs []  - 0 Assessment of Community assistance (transportation, D/C planning, etc.) []  - 0 Additional assistance / Altered mentation []  - 0 Support Surface(s) Assessment (bed, cushion, seat, etc.) INTERVENTIONS - Wound Cleansing / Measurement X - Simple Wound Cleansing - one wound 1 5 []  - 0 Complex Wound Cleansing - multiple wounds X-  1 5 Wound Imaging (photographs - any number of wounds) []  - 0 Wound Tracing (instead of photographs) X- 1 5 Simple  Wound Measurement - one wound []  - 0 Complex Wound Measurement - multiple wounds INTERVENTIONS - Wound Dressings []  - Small Wound Dressing one or multiple wounds 0 X- 2 15 Medium Wound Dressing one or multiple wounds []  - 0 Large Wound Dressing one or multiple wounds []  - 0 Application of Medications - topical []  - 0 Application of Medications - injection INTERVENTIONS - Miscellaneous []  - External ear exam 0 []  - 0 Specimen Collection (cultures, biopsies, blood, body fluids, etc.) []  - 0 Specimen(s) / Culture(s) sent or taken to Lab for analysis []  - 0 Patient Transfer (multiple staff / Civil Service fast streamer / Similar devices) []  - 0 Simple Staple / Suture removal (25 or less) []  - 0 Complex Staple / Suture removal (26 or more) []  - 0 Hypo / Hyperglycemic Management (close monitor of Blood Glucose) []  - 0 Ankle / Brachial Index (ABI) - do not check if billed separately X- 1 5 Vital Signs Devera, Arica C. ([id number]) Has the patient been seen at the hospital within the last three years: Yes Total Score: 115 Level Of Care: New/Established - Level 3 Electronic Signature(s) Signed: 05/01/2019 5:17:10 PM By: Montey Hora Entered By: Montey Hora on 05/01/2019 10:00:38 [name] ([id number]) -------------------------------------------------------------------------------- Encounter Discharge Information Details Patient Name: [name]. Date of Service: 05/01/2019 9:30 AM Medical Record Number: [id number] Patient Account Number: [id number] Date of Birth/Sex: 22-Sep-1955 (63 y.o. F) Treating RN: Montey Hora Primary Care Rashel Okeefe: Tandy Gaw Other Clinician: Referring Marcia Hartwell: Tandy Gaw Treating Karleen Seebeck/Extender: STONE III, HOYT Weeks in Treatment: 10 Encounter Discharge Information Items Discharge Condition: Stable Ambulatory Status:  Cane Discharge Destination: Home Transportation: Private Auto Accompanied By: self Schedule Follow-up Appointment: Yes Clinical Summary of Care: Electronic Signature(s) Signed: 05/01/2019 5:17:10 PM By: Montey Hora Entered By: Montey Hora on 05/01/2019 10:02:11 Petko, [name] ([id number]) -------------------------------------------------------------------------------- Lower Extremity Assessment Details Patient Name: [name] Date of Service: 05/01/2019 9:30 AM Medical Record Number: [id number] Patient Account Number: [id number] Date of Birth/Sex: 02-14-1956 (63 y.o. F) Treating RN: Army Melia Primary Care Liara Holm: Tandy Gaw Other Clinician: Referring Haidar Muse: Tandy Gaw Treating Moxon Messler/Extender: STONE III, HOYT Weeks in Treatment: 10 Edema Assessment Assessed: [Left: No] [Right: No] Edema: [Left: Yes] [Right: Yes] Vascular Assessment Pulses: Dorsalis Pedis Palpable: [Left:Yes] [Right:Yes] Electronic Signature(s) Signed: 05/01/2019 10:17:05 AM By: Army Melia Entered By: Army Melia on 05/01/2019 09:40:05 Scaff, [name] ([id number]) -------------------------------------------------------------------------------- Multi Wound Chart Details Patient Name: [name] Date of Service: 05/01/2019 9:30 AM Medical Record Number: [id number] Patient Account Number: [id number] Date of Birth/Sex: [birth year] (63 y.o. F) Treating RN: Montey Hora Primary Care Deajah Erkkila: Tandy Gaw Other Clinician: Referring Kale Dols: Tandy Gaw Treating Biance Moncrief/Extender: STONE III, HOYT Weeks in Treatment: 10 Vital Signs Height(in): 62 Pulse(bpm): 88 Weight(lbs): 390 Blood Pressure(mmHg): 175/89 Body Mass Index(BMI): 71 Temperature(F): 97.7 Respiratory Rate 16 (breaths/min): Photos: [N/A:N/A] Wound Location: Left, Medial Lower Leg Right, Medial Lower Leg N/A Wounding Event: Blister Blister N/A Primary Etiology: Lymphedema Lymphedema N/A Comorbid  History: Cataracts, Lymphedema, Cataracts, Lymphedema, N/A Asthma, Sleep Apnea, Asthma, Sleep Apnea, Tuberculosis, Hypertension, Tuberculosis, Hypertension, Gout, Osteoarthritis Gout, Osteoarthritis Date Acquired: 10/08/2018 04/23/2019 N/A Weeks of Treatment: 10 0 N/A Wound Status: Healed - Epithelialized Healed - Epithelialized N/A Measurements L x W x D 0x0x0 0x0x0 N/A (cm) Area (cm) : 0 0 N/A Volume (cm) : 0 0 N/A % Reduction in Area: 100.00% 100.00% N/A % Reduction in Volume: 100.00% 100.00% N/A Classification: Full Thickness Without Partial Thickness N/A Exposed Support Structures Exudate Amount: Medium  Medium N/A Exudate Type: Serous Serous N/A Exudate Color: amber amber N/A Granulation Amount: Medium (34-66%) Large (67-100%) N/A Granulation Quality: Pink Red N/A Necrotic Amount: Medium (34-66%) Small (1-33%) N/A Exposed Structures: Fat Layer (Subcutaneous Fat Layer (Subcutaneous N/A Tissue) Exposed: Yes Tissue) Exposed: Yes Fascia: No Fascia: No Tendon: No Tendon: No Muscle: No Muscle: No Rolin, Miko C. ([id number]) Joint: No Joint: No Bone: No Bone: No Epithelialization: N/A None N/A Treatment Notes Electronic Signature(s) Signed: 05/01/2019 5:17:10 PM By: Montey Hora Entered By: Montey Hora on 05/01/2019 09:58:26 Spradley, [name] ([id number]) -------------------------------------------------------------------------------- Multi-Disciplinary Care Plan Details Patient Name: [name]. Date of Service: 05/01/2019 9:30 AM Medical Record Number: [id number] Patient Account Number: [id number] Date of Birth/Sex: 1956/06/09 (63 y.o. F) Treating RN: Montey Hora Primary Care Maxi Carreras: Tandy Gaw Other Clinician: Referring Edison Wollschlager: Tandy Gaw Treating Nethra Mehlberg/Extender: STONE III, HOYT Weeks in Treatment: 10 Active Inactive Abuse / Safety / Falls / Self Care Management Nursing Diagnoses: Potential for falls Goals: Patient will remain injury  free related to falls Date Initiated: 02/20/2019 Target Resolution Date: 05/16/2019 Goal Status: Active Interventions: Assess fall risk on admission and as needed Notes: Orientation to the Wound Care Program Nursing Diagnoses: Knowledge deficit related to the wound healing center program Goals: Patient/caregiver will verbalize understanding of the [place] Program Date Initiated: 02/20/2019 Target Resolution Date: 05/16/2019 Goal Status: Active Interventions: Provide education on orientation to the wound center Notes: Venous Leg Ulcer Nursing Diagnoses: Actual venous Insuffiency (use after diagnosis is confirmed) Goals: Patient will maintain optimal edema control Date Initiated: 02/20/2019 Target Resolution Date: 05/16/2019 Goal Status: Active Interventions: Assess peripheral edema status every visit. MACADY, TORBET ([id number]) Compression as ordered Notes: Wound/Skin Impairment Nursing Diagnoses: Impaired tissue integrity Goals: Ulcer/skin breakdown will heal within 14 weeks Date Initiated: 02/20/2019 Target Resolution Date: 05/16/2019 Goal Status: Active Interventions: Assess patient/caregiver ability to obtain necessary supplies Assess patient/caregiver ability to perform ulcer/skin care regimen upon admission and as needed Assess ulceration(s) every visit Notes: Electronic Signature(s) Signed: 05/01/2019 5:17:10 PM By: Montey Hora Entered By: Montey Hora on 05/01/2019 09:55:27 Churchill, [name] ([id number]) -------------------------------------------------------------------------------- Pain Assessment Details Patient Name: [name]. Date of Service: 05/01/2019 9:30 AM Medical Record Number: [id number] Patient Account Number: [id number] Date of Birth/Sex: 1955/12/01 (63 y.o. F) Treating RN: Army Melia Primary Care Nadean Montanaro: Tandy Gaw Other Clinician: Referring Nick Stults: Tandy Gaw Treating Tresha Muzio/Extender: STONE III,  HOYT Weeks in Treatment: 10 Active Problems Location of Pain Severity and Description of Pain Patient Has Paino No Site Locations Pain Management and Medication Current Pain Management: Electronic Signature(s) Signed: 05/01/2019 10:17:05 AM By: Army Melia Entered By: Army Melia on 05/01/2019 09:30:23 Welton, [name] ([id number]) -------------------------------------------------------------------------------- Patient/Caregiver Education Details Patient Name: [name]. Date of Service: 05/01/2019 9:30 AM Medical Record Number: [id number] Patient Account Number: [id number] Date of Birth/Gender: 1956-06-16 (63 y.o. F) Treating RN: Montey Hora Primary Care Physician: Tandy Gaw Other Clinician: Referring Physician: Tandy Gaw Treating Physician/Extender: Melburn Hake, HOYT Weeks in Treatment: 10 Education Assessment Education Provided To: Patient Education Topics Provided Venous: Handouts: Other: need for lymphedema clinic Methods: Explain/Verbal Responses: State content correctly Electronic Signature(s) Signed: 05/01/2019 5:17:10 PM By: Montey Hora Entered By: Montey Hora on 05/01/2019 10:01:35 Zielke, [name] ([id number]) -------------------------------------------------------------------------------- Wound Assessment Details Patient Name: [name] Date of Service: 05/01/2019 9:30 AM Medical Record Number: [id number] Patient Account Number: [id number] Date of Birth/Sex: [birth year] (63 y.o. F) Treating RN: Montey Hora Primary Care Azuri Bozard: Tandy Gaw Other Clinician: Referring Neetu Carrozza: Tandy Gaw  Treating Tyreese Thain/Extender: STONE III, HOYT Weeks in Treatment: 10 Wound Status Wound Number: 3 Primary Lymphedema Etiology: Wound Location: Left, Medial Lower Leg Wound Healed - Epithelialized Wounding Event: Blister Status: Date Acquired: 10/08/2018 Comorbid Cataracts, Lymphedema, Asthma, Sleep Weeks Of Treatment: 10 History:  Apnea, Tuberculosis, Hypertension, Gout, Clustered Wound: No Osteoarthritis Photos Wound Measurements Length: (cm) 0 % Red Width: (cm) 0 % Red Depth: (cm) 0 Tunne Area: (cm) 0 Unde Volume: (cm) 0 uction in Area: 100% uction in Volume: 100% ling: No rmining: No Wound Description Full Thickness Without Exposed Support Foul Classification: Structures Slou Exudate Medium Amount: Exudate Type: Serous Exudate Color: amber Odor After Cleansing: No gh/Fibrino Yes Wound Bed Granulation Amount: Medium (34-66%) Exposed Structure Granulation Quality: Pink Fascia Exposed: No Necrotic Amount: Medium (34-66%) Fat Layer (Subcutaneous Tissue) Exposed: Yes Necrotic Quality: Adherent Slough Tendon Exposed: No Muscle Exposed: No Joint Exposed: No Bone Exposed: No LASHAUNDA, BRIGNONI ([id number]) Electronic Signature(s) Signed: 05/01/2019 5:17:10 PM By: Montey Hora Entered By: Montey Hora on 05/01/2019 09:57:45 Maltz, [name] ([id number]) -------------------------------------------------------------------------------- Wound Assessment Details Patient Name: [name] Date of Service: 05/01/2019 9:30 AM Medical Record Number: [id number] Patient Account Number: [id number] Date of Birth/Sex: 06/25/1956 (63 y.o. F) Treating RN: Montey Hora Primary Care Eldine Rencher: Tandy Gaw Other Clinician: Referring Xaria Judon: Tandy Gaw Treating Zylon Creamer/Extender: STONE III, HOYT Weeks in Treatment: 10 Wound Status Wound Number: 5 Primary Lymphedema Etiology: Wound Location: Right, Medial Lower Leg Wound Healed - Epithelialized Wounding Event: Blister Status: Date Acquired: 04/23/2019 Comorbid Cataracts, Lymphedema, Asthma, Sleep Weeks Of Treatment: 0 History: Apnea, Tuberculosis, Hypertension, Gout, Clustered Wound: No Osteoarthritis Photos Wound Measurements Length: (cm) 0 % Reductio Width: (cm) 0 % Reductio Depth: (cm) 0 Epithelial Area: (cm) 0 Tunneling Volume:  (cm) 0 Undermini n in Area: 100% n in Volume: 100% ization: None : No ng: No Wound Description Classification: Partial Thickness Foul Odor Exudate Amount: Medium Slough/Fi Exudate Type: Serous Exudate Color: amber After Cleansing: No brino Yes Wound Bed Granulation Amount: Large (67-100%) Exposed Structure Granulation Quality: Red Fascia Exposed: No Necrotic Amount: Small (1-33%) Fat Layer (Subcutaneous Tissue) Exposed: Yes Necrotic Quality: Adherent Slough Tendon Exposed: No Muscle Exposed: No Joint Exposed: No Bone Exposed: No Electronic Signature(s) Signed: 05/01/2019 5:17:10 PM By: Marykay Lex ([id number]) Entered By: Montey Hora on 05/01/2019 09:57:46 Dillin, [name] ([id number]) -------------------------------------------------------------------------------- Vitals Details Patient Name: [name]. Date of Service: 05/01/2019 9:30 AM Medical Record Number: [id number] Patient Account Number: [id number] Date of Birth/Sex: 08-07-1955 (63 y.o. F) Treating RN: Army Melia Primary Care Marigrace Mccole: Tandy Gaw Other Clinician: Referring Reida Hem: Tandy Gaw Treating Seana Underwood/Extender: STONE III, HOYT Weeks in Treatment: 10 Vital Signs Time Taken: 09:30 Temperature (F): 97.7 Height (in): 62 Pulse (bpm): 88 Weight (lbs): 390 Respiratory Rate (breaths/min): 16 Body Mass Index (BMI): 71.3 Blood Pressure (mmHg): 175/89 Reference Range: 80 - 120 mg / dl Electronic Signature(s) Signed: 05/01/2019 10:17:05 AM By: Army Melia Entered By: Army Melia on 05/01/2019 09:33:51

## 2019-05-11 ENCOUNTER — Encounter: Payer: Medicaid Other | Attending: Physician Assistant | Admitting: Physician Assistant

## 2019-05-11 ENCOUNTER — Other Ambulatory Visit: Payer: Self-pay

## 2019-05-11 DIAGNOSIS — Z823 Family history of stroke: Secondary | ICD-10-CM | POA: Diagnosis not present

## 2019-05-11 DIAGNOSIS — E11622 Type 2 diabetes mellitus with other skin ulcer: Secondary | ICD-10-CM | POA: Insufficient documentation

## 2019-05-11 DIAGNOSIS — Z809 Family history of malignant neoplasm, unspecified: Secondary | ICD-10-CM | POA: Diagnosis not present

## 2019-05-11 DIAGNOSIS — J45909 Unspecified asthma, uncomplicated: Secondary | ICD-10-CM | POA: Diagnosis not present

## 2019-05-11 DIAGNOSIS — G4730 Sleep apnea, unspecified: Secondary | ICD-10-CM | POA: Diagnosis not present

## 2019-05-11 DIAGNOSIS — Z87891 Personal history of nicotine dependence: Secondary | ICD-10-CM | POA: Diagnosis not present

## 2019-05-11 DIAGNOSIS — I890 Lymphedema, not elsewhere classified: Secondary | ICD-10-CM | POA: Diagnosis not present

## 2019-05-11 DIAGNOSIS — Z820 Family history of epilepsy and other diseases of the nervous system: Secondary | ICD-10-CM | POA: Diagnosis not present

## 2019-05-11 DIAGNOSIS — L97812 Non-pressure chronic ulcer of other part of right lower leg with fat layer exposed: Secondary | ICD-10-CM | POA: Insufficient documentation

## 2019-05-11 DIAGNOSIS — Z6841 Body Mass Index (BMI) 40.0 and over, adult: Secondary | ICD-10-CM | POA: Insufficient documentation

## 2019-05-11 DIAGNOSIS — I252 Old myocardial infarction: Secondary | ICD-10-CM | POA: Insufficient documentation

## 2019-05-11 DIAGNOSIS — L03115 Cellulitis of right lower limb: Secondary | ICD-10-CM | POA: Insufficient documentation

## 2019-05-11 DIAGNOSIS — L97822 Non-pressure chronic ulcer of other part of left lower leg with fat layer exposed: Secondary | ICD-10-CM | POA: Insufficient documentation

## 2019-05-11 DIAGNOSIS — E1151 Type 2 diabetes mellitus with diabetic peripheral angiopathy without gangrene: Secondary | ICD-10-CM | POA: Diagnosis not present

## 2019-05-11 DIAGNOSIS — Z8249 Family history of ischemic heart disease and other diseases of the circulatory system: Secondary | ICD-10-CM | POA: Diagnosis not present

## 2019-05-11 DIAGNOSIS — E669 Obesity, unspecified: Secondary | ICD-10-CM | POA: Insufficient documentation

## 2019-05-11 DIAGNOSIS — M1990 Unspecified osteoarthritis, unspecified site: Secondary | ICD-10-CM | POA: Insufficient documentation

## 2019-05-11 DIAGNOSIS — I10 Essential (primary) hypertension: Secondary | ICD-10-CM | POA: Diagnosis not present

## 2019-05-11 NOTE — Progress Notes (Addendum)
TIHANNA, LUTFI ([id number]) Visit Report for 05/11/2019 Chief Complaint Document Details Patient Name: [name], MOROS. Date of Service: 05/11/2019 2:00 PM Medical Record Number: [id number] Patient Account Number: [id number] Date of Birth/Sex: 1955/08/28 (63 y.o. F) Treating RN: Harold Barban Primary Care Provider: Tandy Gaw Other Clinician: Referring Provider: Tandy Gaw Treating Provider/Extender: Melburn Hake, HOYT Weeks in Treatment: 11 Information Obtained from: Patient Chief Complaint Left LE Ulcer Electronic Signature(s) Signed: 05/11/2019 2:28:20 PM By: Worthy Keeler PA-C Entered By: Worthy Keeler on 05/11/2019 14:28:20 [name], [name] ([id number]) -------------------------------------------------------------------------------- HPI Details Patient Name: [name]. Date of Service: 05/11/2019 2:00 PM Medical Record Number: [id number] Patient Account Number: [id number] Date of Birth/Sex: 1955-07-28 (63 y.o. F) Treating RN: Harold Barban Primary Care Provider: Tandy Gaw Other Clinician: Referring Provider: Tandy Gaw Treating Provider/Extender: STONE III, HOYT Weeks in Treatment: 11 History of Present Illness HPI Description: The patient is a 63 year old female with history of hypertension and a long-standing history of bilateral lower extremity lymphedema (first presented on 4/2) . She has had open ulcers in the past which have always responded to compression therapy. She had briefly been to a lymphedema clinic in the past which helped her at the time. this time around she stopped treatment of her lymphedema pumps approximately 2 weeks ago because of some pain in the knees and then noticed the right leg getting worse. She was seen by her PCP who put her on clindamycin 4 times a day 2 days ago. The patient has seen [facility] and Dr. Delana Meyer had seen her last year where a vascular study including venous and arterial duplex studies were within normal  limits. he had recommended compression stockings and lymphedema pumps and the patient has been using this in about 2 weeks ago. She is known to be diabetic but in the past few time she's gone to her primary care doctor her hemoglobin A1c has been normal. 02/11/2015 - after her last visit she took my advice and went to the ER regarding the progressive cellulitis of her right lower extremity and she was admitted between July 17 and 22nd. She received IV antibiotics and then was sent home on a course of steroid-induced and oral antibiotics. She has improved much since then. 02/17/2015 -- she has been doing fine and the weeping of her legs has remarkably gone down. She has no fresh issues. READMISSION 01/15/18 This patient was given this clinic before most recently in 2016 seen by Dr. Con Memos. She has massive bilateral lymphedema and over the last 2 months this had weeping edema out of the left leg. She has compression pumps but her compliance with these has been minimal. She has [facility] they've been using TCA/ABDs/kerlix under an Ace wrap.she has had recent problems with cellulitis. She was apparently seen in the ER and 12/23/17 and given clindamycin. She was then followed by her primary doctor and given doxycycline and Keflex. The pain seems to have settled down. In April 2018 the patient had arterial studies done at [facility]. This showed triphasic waveforms throughout the right leg and mostly triphasic waveforms on the left except for monophasic at the posterior tibial artery distally. She was not felt to have evidence of right lower extremity arterial stenosis or significant problems on the left side. 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 She also had a right lower extremity venous Doppler in January 2018 this was limited by the patient's body habitus and lymphedema. Most of the  proximal veins were not visualized The patient presents with  an area of denuded skin on the anterior medial part of the left calf. There is weeping edema fluid here. 01/22/18; the patient has somewhat better edema control using her compression pumps twice a day and as a result she has much better epithelialization on the left anterior calf area. Only a small open area remains. 01/29/18; the patient has been compliant with her compression pumps. Both the areas on her calf that healed. The remaining area on the left anterior leg is fully epithelialized Readmission: 02/20/2019 upon evaluation today patient presents for reevaluation due to issues that she is having with the bilateral lower extremities. She actually has wounds open on both legs. On the right she has an area in the crease of her leg on the right around the knee region which is actually draining quite a bit and actually has some fungal type appearance to it. She has been on nystatin powder that seems to have helped to some degree. In regard to the left lower extremity this is actually in the [place]. ([id number]) lower portion of her leg closer to the ankle and again is continuing to drain as well unfortunately. There does not appear to be any signs of active infection at this time which is good news. No fevers, chills, nausea, vomiting, or diarrhea. She tells me that since she was seen last year she is actually been doing quite well for the most part with regard to her lower extremities. Unfortunately she now is experiencing a little bit more drainage at this time. She is concerned about getting this under control so that it does not get significantly worse. 02/27/2019 on evaluation today patient appears to be doing somewhat better in regard to her bilateral lower extremity wounds. She has been tolerating the dressing changes without complication. Fortunately there is no signs of active infection at this point. No fevers, chills, nausea, vomiting, or diarrhea. She did get her dressing  supplies which is excellent news she was extremely excited to get these. She also got paperwork from [employer] for their financial assistance program where they may be able to help her out in the future if needed with supplies at discounted prices. 03/06/2019 on evaluation today patient appears to be doing a little worse with regard to both areas of weeping on her bilateral lower extremities. This is around the right medial knee and just above the left ankle. With that being said she is unfortunately not doing as well as I would like to see. I feel like she may need to potentially go see someone at the lymphedema clinic as the wraps that she needs or even beyond what we can do here at the wound care center. She really does not have wounds she just has open areas of weeping that are causing some difficulty for her. Subsequently because of this and the moisture I am concerned about the potential for infection I am going to likely give her a prophylactic antibiotic today, Keflex, just to be on the safe side. Nonetheless again there is no obvious signs of active infection at this time. 03/13/2019 on evaluation today patient appears to be doing well with regard to her bilateral lower extremities where she has been weeping compared to even last week's evaluation. I see some areas of new skin growth which is excellent and overall I am very pleased with how things seem to be progressing. No fevers, chills, nausea, vomiting, or diarrhea. 03/20/2019 on evaluation  today patient unfortunately is continuing to have issues with significant edema of the left lower extremity. Her right side seems to be doing much better. Unfortunately her left side is showing increased weeping of the lower portion of her leg. This is quite unfortunate obviously we were hoping to get her into the lymphedema clinic they really do not seem to when I see her how if she is draining. Despite the fact this is really not wound related but more  lymphedema weeping related. Nonetheless I do not know that this can be helpful for her to even go for that appointment since again I am not sure there is much that they would actually do at this point. We may need to try a 4 layer compression wrap as best we can on her leg. She is on the Augmentin currently although I am still concerned about whether or not there could be potentially something going on infection wise I would obtain a culture though I understand is not the best being that is a surface culture I just 1 to make sure I do not seem to be missing anything. 03/27/2019 on evaluation today patient appears to be doing much better in regard to the left lower extremity compared to last week. Last week she had tremendous weeping which I think was subsequent to infection now she seems to be doing much better and very pleased. This is not completely healed but there is a lot of new skin growth and it has dried out quite a bit. Overall I think that we are doing well with how things are moving along at this time. No fevers, chills, nausea, vomiting, or diarrhea. 04/03/2019 evaluation today patient appears to be doing a little worse this week compared to last time I saw her. I think this may be due to the fact that she is having issues with not being able to sleep in her bed at least not until last night. She is therefore been in a lift chair and subsequently has also had issues with not been able to use her pumps since she could not get in bed. With that being said the patient overall seems to be doing okay I do think I may want extend the antibiotic for a little bit longer at least until we can see if her edema and her weeping gets better and if it is then obviously I can always discontinue the antibiotics as of next week however I want her to continue to have it over the next week. 04/10/2019 on evaluation today patient unfortunately is still doing poorly with regard to her left lower extremity. Her  right is all things considering doing fairly well. On the left however she continues to have spreading of the area of infection and weeping which appears to be even a larger surface area than noted last week. She did have a positive culture for Pseudomonas in particular which seems to have been of concern she still has green/yellow discharge consistent with Pseudomonas and subsequently a tremendous amount of it. This has me obviously still concerned about the infection not really clearing up despite the fact that on culture it appears the Cipro should have been a good option for treating this. I think she may at this point need IV antibiotics since things are not doing better I do not want to get worse and cause sepsis. She is in agreement with the plan and believes as well that she likely does need to go to the hospital for  IV vancomycin. Or something of the like depending on what the recommendation is from the ER. 04/17/2019 on evaluation today patient appears to be doing excellent in regard to her lower extremity on the left. She was in the hospital for several days from when I sent her last we saw her until just this past Tuesday. Fortunately her drainage is significantly improved and in fact is mostly clear. There is just a couple small areas that may still drain a little bit she states UNA, ATHERHOLT. ([id number]) that the [facility] they prescribed for her at discharge she went picked up from pharmacy and got home but has not been able to find it since. She is looked everywhere. She is wondering if I will replace that for her today I will be more than happy to do that. 05/01/2019 on evaluation today patient actually appears to be doing quite well with regard to her lower extremities. She occasionally is having areas that will leak and then heal up mainly when a piece of the fibrotic skin pops off but fortunately she is not having any signs of active infection at this time. Overall she also  really does not have any obvious weeping at this time. I do believe however she really needs some compression wraps and I think this may be a good time to get her back to the lymphedema clinic. 05/11/2019 on evaluation today patient actually appears to be doing quite well with regard to her bilateral lower extremities. She occasionally will have a small area that we per another but in general seems to be completely healed which is great news. Overall very pleased with how everything seems to be progressing. She does have her appointment with lymphedema clinic on November 18. Electronic Signature(s) Signed: 05/11/2019 2:52:53 PM By: Worthy Keeler PA-C Entered By: Worthy Keeler on 05/11/2019 14:52:53 [name], [name] ([id number]) -------------------------------------------------------------------------------- Physical Exam Details Patient Name: TERENA, BLUEFORD C. Date of Service: 05/11/2019 2:00 PM Medical Record Number: [id number] Patient Account Number: [id number] Date of Birth/Sex: 1956-03-16 (63 y.o. F) Treating RN: Harold Barban Primary Care Provider: Tandy Gaw Other Clinician: Referring Provider: Tandy Gaw Treating Provider/Extender: STONE III, HOYT Weeks in Treatment: 64 Constitutional Well-nourished and well-hydrated in no acute distress. Respiratory normal breathing without difficulty. clear to auscultation bilaterally. Cardiovascular regular rate and rhythm with normal S1, S2. Psychiatric this patient is able to make decisions and demonstrates good insight into disease process. Alert and Oriented x 3. pleasant and cooperative. Notes Upon inspection today patient's bilateral lower extremities appear to have no active an open wound she does have some areas that will sometimes drain a little bit here and there but nothing like what she has had previous. She continues to wrap this as best she can with an Ace wrap at home. Electronic Signature(s) Signed: 05/11/2019  2:53:35 PM By: Worthy Keeler PA-C Entered By: Worthy Keeler on 05/11/2019 14:53:35 Bleier, [name] ([id number]) -------------------------------------------------------------------------------- Physician Orders Details Patient Name: [name] Date of Service: 05/11/2019 2:00 PM Medical Record Number: [id number] Patient Account Number: [id number] Date of Birth/Sex: [birth year] (63 y.o. F) Treating RN: Harold Barban Primary Care Provider: Tandy Gaw Other Clinician: Referring Provider: Tandy Gaw Treating Provider/Extender: STONE III, HOYT Weeks in Treatment: 11 Verbal / Phone Orders: No Diagnosis Coding ICD-10 Coding Code Description I89.0 Lymphedema, not elsewhere classified L97.822 Non-pressure chronic ulcer of other part of left lower leg with fat layer exposed L97.812 Non-pressure chronic ulcer of other part of right lower leg with fat  layer exposed Wound Cleansing o Dial antibacterial soap, wash wounds, rinse and pat dry prior to dressing wounds o May Shower, gently pat wound dry prior to applying new dressing. Secondary Dressing o ABD and Kerlix/Conform Dressing Change Frequency o Change dressing every other day. - and as needed Follow-up Appointments o Return Appointment in 2 weeks. Edema Control o Compression Pump: Use compression pump on left lower extremity for 60 minutes, twice daily. o Compression Pump: Use compression pump on right lower extremity for 60 minutes, twice daily. o Other: - ACE Wraps Electronic Signature(s) Signed: 05/11/2019 4:25:43 PM By: Harold Barban Signed: 05/13/2019 2:09:51 AM By: Worthy Keeler PA-C Entered By: Harold Barban on 05/11/2019 14:48:51 [name], [name] ([id number]) -------------------------------------------------------------------------------- Problem List Details Patient Name: [name], [name]. Date of Service: 05/11/2019 2:00 PM Medical Record Number: [id number] Patient Account Number:  [id number] Date of Birth/Sex: 23-Dec-1955 (63 y.o. F) Treating RN: Harold Barban Primary Care Provider: Tandy Gaw Other Clinician: Referring Provider: Tandy Gaw Treating Provider/Extender: STONE III, HOYT Weeks in Treatment: 11 Active Problems ICD-10 Evaluated Encounter Code Description Active Date Today Diagnosis I89.0 Lymphedema, not elsewhere classified 02/20/2019 No Yes L97.822 Non-pressure chronic ulcer of other part of left lower leg with 02/20/2019 No Yes fat layer exposed L97.812 Non-pressure chronic ulcer of other part of right lower leg 02/20/2019 No Yes with fat layer exposed Inactive Problems Resolved Problems Electronic Signature(s) Signed: 05/11/2019 2:28:07 PM By: Worthy Keeler PA-C Entered By: Worthy Keeler on 05/11/2019 14:28:07 Gotto, [name] ([id number]) -------------------------------------------------------------------------------- Progress Note Details Patient Name: [name]. Date of Service: 05/11/2019 2:00 PM Medical Record Number: [id number] Patient Account Number: [id number] Date of Birth/Sex: 1956-03-18 (63 y.o. F) Treating RN: Harold Barban Primary Care Provider: Tandy Gaw Other Clinician: Referring Provider: Tandy Gaw Treating Provider/Extender: STONE III, HOYT Weeks in Treatment: 11 Subjective Chief Complaint Information obtained from Patient Left LE Ulcer History of Present Illness (HPI) The patient is a 63 year old female with history of hypertension and a long-standing history of bilateral lower extremity lymphedema (first presented on 4/2) . She has had open ulcers in the past which have always responded to compression therapy. She had briefly been to a lymphedema clinic in the past which helped her at the time. this time around she stopped treatment of her lymphedema pumps approximately 2 weeks ago because of some pain in the knees and then noticed the right leg getting worse. She was seen by her PCP who put her on  clindamycin 4 times a day 2 days ago. The patient has seen [facility] and Dr. Delana Meyer had seen her last year where a vascular study including venous and arterial duplex studies were within normal limits. he had recommended compression stockings and lymphedema pumps and the patient has been using this in about 2 weeks ago. She is known to be diabetic but in the past few time she's gone to her primary care doctor her hemoglobin A1c has been normal. 02/11/2015 - after her last visit she took my advice and went to the ER regarding the progressive cellulitis of her right lower extremity and she was admitted between July 17 and 22nd. She received IV antibiotics and then was sent home on a course of steroid-induced and oral antibiotics. She has improved much since then. 02/17/2015 -- she has been doing fine and the weeping of her legs has remarkably gone down. She has no fresh issues. READMISSION 01/15/18 This patient was given this clinic before most recently in 2016 seen by Dr. Con Memos.  She has massive bilateral lymphedema and over the last 2 months this had weeping edema out of the left leg. She has compression pumps but her compliance with these has been minimal. She has [facility] they've been using TCA/ABDs/kerlix under an Ace wrap.she has had recent problems with cellulitis. She was apparently seen in the ER and 12/23/17 and given clindamycin. She was then followed by her primary doctor and given doxycycline and Keflex. The pain seems to have settled down. In April 2018 the patient had arterial studies done at [facility]. This showed triphasic waveforms throughout the right leg and mostly triphasic waveforms on the left except for monophasic at the posterior tibial artery distally. She was not felt to have evidence of right lower extremity arterial stenosis or significant problems on the left side. 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 She also had a  right lower extremity venous Doppler in January 2018 this was limited by the patient's body habitus and lymphedema. Most of the proximal veins were not visualized The patient presents with an area of denuded skin on the anterior medial part of the left calf. There is weeping edema fluid here. 01/22/18; the patient has somewhat better edema control using her compression pumps twice a day and as a result she has much better epithelialization on the left anterior calf area. Only a small open area remains. 01/29/18; the patient has been compliant with her compression pumps. Both the areas on her calf that healed. The remaining area on the left anterior leg is fully epithelialized [name], [name]. ([id number]) Readmission: 02/20/2019 upon evaluation today patient presents for reevaluation due to issues that she is having with the bilateral lower extremities. She actually has wounds open on both legs. On the right she has an area in the crease of her leg on the right around the knee region which is actually draining quite a bit and actually has some fungal type appearance to it. She has been on nystatin powder that seems to have helped to some degree. In regard to the left lower extremity this is actually in the lower portion of her leg closer to the ankle and again is continuing to drain as well unfortunately. There does not appear to be any signs of active infection at this time which is good news. No fevers, chills, nausea, vomiting, or diarrhea. She tells me that since she was seen last year she is actually been doing quite well for the most part with regard to her lower extremities. Unfortunately she now is experiencing a little bit more drainage at this time. She is concerned about getting this under control so that it does not get significantly worse. 02/27/2019 on evaluation today patient appears to be doing somewhat better in regard to her bilateral lower extremity wounds. She has been tolerating  the dressing changes without complication. Fortunately there is no signs of active infection at this point. No fevers, chills, nausea, vomiting, or diarrhea. She did get her dressing supplies which is excellent news she was extremely excited to get these. She also got paperwork from [employer] for their financial assistance program where they may be able to help her out in the future if needed with supplies at discounted prices. 03/06/2019 on evaluation today patient appears to be doing a little worse with regard to both areas of weeping on her bilateral lower extremities. This is around the right medial knee and just above the left ankle. With that being said  she is unfortunately not doing as well as I would like to see. I feel like she may need to potentially go see someone at the lymphedema clinic as the wraps that she needs or even beyond what we can do here at the wound care center. She really does not have wounds she just has open areas of weeping that are causing some difficulty for her. Subsequently because of this and the moisture I am concerned about the potential for infection I am going to likely give her a prophylactic antibiotic today, Keflex, just to be on the safe side. Nonetheless again there is no obvious signs of active infection at this time. 03/13/2019 on evaluation today patient appears to be doing well with regard to her bilateral lower extremities where she has been weeping compared to even last week's evaluation. I see some areas of new skin growth which is excellent and overall I am very pleased with how things seem to be progressing. No fevers, chills, nausea, vomiting, or diarrhea. 03/20/2019 on evaluation today patient unfortunately is continuing to have issues with significant edema of the left lower extremity. Her right side seems to be doing much better. Unfortunately her left side is showing increased weeping of the lower portion of her leg. This is quite unfortunate obviously  we were hoping to get her into the lymphedema clinic they really do not seem to when I see her how if she is draining. Despite the fact this is really not wound related but more lymphedema weeping related. Nonetheless I do not know that this can be helpful for her to even go for that appointment since again I am not sure there is much that they would actually do at this point. We may need to try a 4 layer compression wrap as best we can on her leg. She is on the Augmentin currently although I am still concerned about whether or not there could be potentially something going on infection wise I would obtain a culture though I understand is not the best being that is a surface culture I just 1 to make sure I do not seem to be missing anything. 03/27/2019 on evaluation today patient appears to be doing much better in regard to the left lower extremity compared to last week. Last week she had tremendous weeping which I think was subsequent to infection now she seems to be doing much better and very pleased. This is not completely healed but there is a lot of new skin growth and it has dried out quite a bit. Overall I think that we are doing well with how things are moving along at this time. No fevers, chills, nausea, vomiting, or diarrhea. 04/03/2019 evaluation today patient appears to be doing a little worse this week compared to last time I saw her. I think this may be due to the fact that she is having issues with not being able to sleep in her bed at least not until last night. She is therefore been in a lift chair and subsequently has also had issues with not been able to use her pumps since she could not get in bed. With that being said the patient overall seems to be doing okay I do think I may want extend the antibiotic for a little bit longer at least until we can see if her edema and her weeping gets better and if it is then obviously I can always discontinue the antibiotics as of next week  however I want her  to continue to have it over the next week. 04/10/2019 on evaluation today patient unfortunately is still doing poorly with regard to her left lower extremity. Her right is all things considering doing fairly well. On the left however she continues to have spreading of the area of infection and weeping which appears to be even a larger surface area than noted last week. She did have a positive culture for Pseudomonas in particular which seems to have been of concern she still has green/yellow discharge consistent with Pseudomonas and subsequently a tremendous amount of it. This has me obviously still concerned about the infection not really clearing up despite the fact that on culture it appears the Cipro should have been a good option for treating this. I think she may at this point need IV antibiotics since things are not doing better I do not want to get worse and cause sepsis. She is in agreement [name], [name]. ([id number]) with the plan and believes as well that she likely does need to go to the hospital for IV vancomycin. Or something of the like depending on what the recommendation is from the ER. 04/17/2019 on evaluation today patient appears to be doing excellent in regard to her lower extremity on the left. She was in the hospital for several days from when I sent her last we saw her until just this past Tuesday. Fortunately her drainage is significantly improved and in fact is mostly clear. There is just a couple small areas that may still drain a little bit she states that the [facility] they prescribed for her at discharge she went picked up from pharmacy and got home but has not been able to find it since. She is looked everywhere. She is wondering if I will replace that for her today I will be more than happy to do that. 05/01/2019 on evaluation today patient actually appears to be doing quite well with regard to her lower extremities. She occasionally is having areas  that will leak and then heal up mainly when a piece of the fibrotic skin pops off but fortunately she is not having any signs of active infection at this time. Overall she also really does not have any obvious weeping at this time. I do believe however she really needs some compression wraps and I think this may be a good time to get her back to the lymphedema clinic. 05/11/2019 on evaluation today patient actually appears to be doing quite well with regard to her bilateral lower extremities. She occasionally will have a small area that we per another but in general seems to be completely healed which is great news. Overall very pleased with how everything seems to be progressing. She does have her appointment with lymphedema clinic on November 18. Patient History Information obtained from Patient. Family History Cancer - Father, Diabetes - Father, Heart Disease - Mother, Hypertension - Mother,Father, Seizures - Mother, Stroke - Mother, Thyroid Problems - Mother,Siblings, No family history of Hereditary Spherocytosis, Kidney Disease, Lung Disease, Tuberculosis. Social History Former smoker - uses snuff, Marital Status - Single, Alcohol Use - Rarely, Drug Use - No History, Caffeine Use - Daily. 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 Denies  history of Aspiration, Chronic Obstructive Pulmonary Disease (COPD), Pneumothorax Cardiovascular Patient has history of Hypertension Denies history of Angina, Arrhythmia, Congestive Heart Failure, Coronary Artery Disease, Deep Vein  Thrombosis, Hypotension, Myocardial Infarction, Peripheral Arterial Disease, Peripheral Venous Disease, Phlebitis, Vasculitis Gastrointestinal Denies history of Cirrhosis , Colitis, Crohn s, Hepatitis A, Hepatitis B, Hepatitis C Endocrine Denies history of Type I Diabetes, Type II Diabetes Genitourinary Denies history of End Stage Renal Disease Immunological Denies history of Lupus Erythematosus, Raynaud s, Scleroderma Integumentary (Skin) Denies history of History of Burn, History of pressure wounds Musculoskeletal [name], [name] ([id number]) Patient has history of Gout, Osteoarthritis Denies history of Rheumatoid Arthritis, Osteomyelitis Neurologic Denies history of Dementia, Neuropathy, Quadriplegia, Paraplegia, Seizure Disorder Oncologic Denies history of Received Chemotherapy, Received Radiation Psychiatric Denies history of Anorexia/bulimia, Confinement Anxiety Review of Systems (ROS) Constitutional Symptoms (General Health) Denies complaints or symptoms of Fatigue, Fever, Chills, Marked Weight Change. Respiratory Denies complaints or symptoms of Chronic or frequent coughs, Shortness of Breath. Cardiovascular Complains or has symptoms of LE edema. Denies complaints or symptoms of Chest pain. Psychiatric Denies complaints or symptoms of Anxiety, Claustrophobia. Objective Constitutional Well-nourished and well-hydrated in no acute distress. Vitals Time Taken: 2:08 PM, Height: 62 in, Weight: 390 lbs, BMI: 71.3, Temperature: 98.7 F, Pulse: 64 bpm, Respiratory Rate: 16 breaths/min, Blood Pressure: 163/73 mmHg. Respiratory normal breathing without difficulty. clear to auscultation bilaterally. Cardiovascular regular rate and rhythm with normal S1, S2. Psychiatric this patient is able to make decisions and demonstrates good insight into disease process. Alert and Oriented x 3. pleasant and cooperative. General Notes: Upon inspection today patient's bilateral lower  extremities appear to have no active an open wound she does have some areas that will sometimes drain a little bit here and there but nothing like what she has had previous. She continues to wrap this as best she can with an Ace wrap at home. Assessment [name], [name] ([id number]) Active Problems ICD-10 Lymphedema, not elsewhere classified Non-pressure chronic ulcer of other part of left lower leg with fat layer exposed Non-pressure chronic ulcer of other part of right lower leg with fat layer exposed Plan Wound Cleansing: Dial antibacterial soap, wash wounds, rinse and pat dry prior to dressing wounds May Shower, gently pat wound dry prior to applying new dressing. Secondary Dressing: ABD and Kerlix/Conform Dressing Change Frequency: Change dressing every other day. - and as needed Follow-up Appointments: Return Appointment in 2 weeks. Edema Control: Compression Pump: Use compression pump on left lower extremity for 60 minutes, twice daily. Compression Pump: Use compression pump on right lower extremity for 60 minutes, twice daily. Other: - ACE Wraps 1. My suggestion at this point is going to be that we have the patient continue with her wraps as she is doing at home that does seem to be helping with her edema to some degree and overall she is not having any significant weeping which is also excellent news. 2. We will see her 1 more time between now and when she goes to the lymphedema clinic she is slated to see them on May 27, 2019. 3. With regard to compression therapy I do recommend she use her lymphedema pumps 2 times a day for 60 minutes each time I think that would be beneficial for her to continue to do as well. We will see patient back for reevaluation in 2 weeks here in the clinic. If anything worsens or changes patient will contact our office for additional recommendations. Electronic Signature(s) Signed: 05/11/2019 2:54:32 PM By:  Melburn Hake, Hoyt PA-C Entered By:  Worthy Keeler on 05/11/2019 14:54:32 [name], [name] ([id number]) -------------------------------------------------------------------------------- ROS/PFSH Details Patient Name: [name], ETCHISON. Date of Service: 05/11/2019 2:00 PM Medical Record Number: [id number] Patient Account Number: [id number] Date of Birth/Sex: 1956-06-17 (63 y.o. 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 Negative for: Aspiration; Chronic Obstructive Pulmonary Disease (COPD); Pneumothorax Cardiovascular Complaints and Symptoms: Positive for: LE edema Negative for: Chest pain Medical History: Positive for: Hypertension Negative for: Angina; Arrhythmia; Congestive Heart Failure; Coronary Artery Disease; Deep Vein Thrombosis; Hypotension; Myocardial Infarction; Peripheral Arterial Disease; Peripheral Venous Disease; Phlebitis; Vasculitis Psychiatric Complaints and Symptoms: Negative for: Anxiety; Claustrophobia Medical History: Negative for: Anorexia/bulimia; Confinement Anxiety Eyes Medical History: Positive for: Cataracts Negative for: Glaucoma; Optic Neuritis Ear/Nose/Mouth/Throat Medical History: Negative for: Chronic sinus problems/congestion; Middle ear problems Hematologic/Lymphatic Medical History: Positive for: Lymphedema TALLY, LAMARQUE. ([id number]) Negative for: Anemia; Hemophilia; Human Immunodeficiency Virus; Sickle Cell Disease Gastrointestinal Medical History: Negative  for: Cirrhosis ; Colitis; Crohnos; Hepatitis A; Hepatitis B; Hepatitis C Endocrine Medical History: Negative for: Type I Diabetes; Type II Diabetes Genitourinary Medical History: Negative for: End Stage Renal Disease Immunological Medical History: Negative for: Lupus Erythematosus; Raynaudos; Scleroderma Integumentary (Skin) Medical History: Negative for: History of Burn; History of pressure wounds Musculoskeletal Medical History: Positive for: Gout; Osteoarthritis Negative for: Rheumatoid Arthritis; Osteomyelitis Neurologic Medical History: Negative for: Dementia; Neuropathy; Quadriplegia; Paraplegia; Seizure Disorder Oncologic Medical History: Negative for: Received Chemotherapy; Received Radiation HBO Extended History Items Eyes: Cataracts Immunizations Pneumococcal Vaccine: Received Pneumococcal Vaccination: Yes Implantable Devices No devices added Family and Social History Cancer: Yes - Father; Diabetes: Yes - Father; Heart Disease: Yes - Mother; Hereditary Spherocytosis: No; Hypertension: Yes - Mother,Father; Kidney Disease: No; Lung Disease: No; Seizures: Yes - Mother; Stroke: Yes - Mother; Thyroid Problems: Yes - Mother,Siblings; Tuberculosis: No; Former smoker - uses snuff; Marital Status - Single; Alcohol Use: Rarely; Drug Use: MANIQUE, WOLLEN C. ([id number]) No History; Caffeine Use: Daily; Financial Concerns: No; Food, Clothing or Shelter Needs: No; Support System Lacking: No; Transportation Concerns: No Physician Affirmation I have reviewed and agree with the above information. Electronic Signature(s) Signed: 05/11/2019 4:25:43 PM By: Harold Barban Signed: 05/13/2019 2:09:51 AM By: Worthy Keeler PA-C Entered By: Worthy Keeler on 05/11/2019 14:53:17 [place], [name] ([id number]) -------------------------------------------------------------------------------- SuperBill Details Patient Name: [name]. Date of Service: 05/11/2019 Medical Record  Number: [id number] Patient Account Number: [id number] Date of Birth/Sex: [birth year] (63 y.o. F) Treating RN: Harold Barban Primary Care Provider: Tandy Gaw Other Clinician: Referring Provider: Tandy Gaw Treating Provider/Extender: STONE III, HOYT Weeks in Treatment: 11 Diagnosis Coding ICD-10 Codes Code Description I89.0 Lymphedema, not elsewhere classified L97.822 Non-pressure chronic ulcer of other part of left lower leg with fat layer exposed L97.812 Non-pressure chronic ulcer of other part of right lower leg with fat layer exposed Facility Procedures CPT4 Code: [id number] Description: 99213 - WOUND CARE VISIT-LEV 3 EST PT Modifier: Quantity: 1 Physician Procedures CPT4 Code Description: [id number] - WC PHYS LEVEL 4 - EST PT ICD-10 Diagnosis Description I89.0 Lymphedema, not elsewhere classified L97.822 Non-pressure chronic ulcer of other part of  left lower leg wit L97.812 Non-pressure chronic ulcer of other  part of right lower leg wi Modifier: h fat layer expos th fat layer expo Quantity: 1 ed sed Electronic Signature(s) Signed: 05/11/2019 2:54:50 PM By: Worthy Keeler PA-C Entered By: Worthy Keeler on 05/11/2019 14:54:50

## 2019-05-12 ENCOUNTER — Ambulatory Visit: Payer: Medicaid Other | Admitting: Physician Assistant

## 2019-05-14 NOTE — Progress Notes (Signed)
KAIDAN, ANELLI ([id number]) Visit Report for 05/11/2019 Arrival Information Details Patient Name: [name], [name]. Date of Service: 05/11/2019 2:00 PM Medical Record Number: [id number] Patient Account Number: [id number] Date of Birth/Sex: February 05, 1956 (63 y.o. F) Treating RN: Cornell Barman Primary Care Casey Maxfield: Tandy Gaw Other Clinician: Referring Desiree Daise: Tandy Gaw Treating Ardyn Forge/Extender: Melburn Hake, HOYT Weeks in Treatment: 11 Visit Information History Since Last Visit Added or deleted any medications: No Patient Arrived: Cane Any new allergies or adverse reactions: No Arrival Time: 14:07 Had a fall or experienced change in No Accompanied By: self activities of daily living that may affect Transfer Assistance: None risk of falls: Patient Identification Verified: Yes Signs or symptoms of abuse/neglect since last visito No Secondary Verification Process Completed: Yes Hospitalized since last visit: No Patient Requires Transmission-Based Precautions: No Implantable device outside of the clinic excluding No Patient Has Alerts: No cellular tissue based products placed in the center since last visit: Has Dressing in Place as Prescribed: Yes Pain Present Now: No Electronic Signature(s) Signed: 05/14/2019 5:19:38 PM By: Gretta Cool, BSN, RN, CWS, Kim RN, BSN Entered By: Gretta Cool, BSN, RN, CWS, Kim on 05/11/2019 14:08:12 [name] ([id number]) -------------------------------------------------------------------------------- Clinic Level of Care Assessment Details Patient Name: [name]. Date of Service: 05/11/2019 2:00 PM Medical Record Number: [id number] Patient Account Number: [id number] Date of Birth/Sex: [birth year] (63 y.o. F) Treating RN: Harold Barban Primary Care Marianne Golightly: Tandy Gaw Other Clinician: Referring Ermagene Saidi: Tandy Gaw Treating Livio Ledwith/Extender: STONE III, HOYT Weeks in Treatment: 11 Clinic Level of Care Assessment Items TOOL 4 Quantity  Score []  - Use when only an EandM is performed on FOLLOW-UP visit 0 ASSESSMENTS - Nursing Assessment / Reassessment X - Reassessment of Co-morbidities (includes updates in patient status) 1 10 X- 1 5 Reassessment of Adherence to Treatment Plan ASSESSMENTS - Wound and Skin Assessment / Reassessment []  - Simple Wound Assessment / Reassessment - one wound 0 X- 2 5 Complex Wound Assessment / Reassessment - multiple wounds []  - 0 Dermatologic / Skin Assessment (not related to wound area) ASSESSMENTS - Focused Assessment []  - Circumferential Edema Measurements - multi extremities 0 []  - 0 Nutritional Assessment / Counseling / Intervention []  - 0 Lower Extremity Assessment (monofilament, tuning fork, pulses) []  - 0 Peripheral Arterial Disease Assessment (using hand held doppler) ASSESSMENTS - Ostomy and/or Continence Assessment and Care []  - Incontinence Assessment and Management 0 []  - 0 Ostomy Care Assessment and Management (repouching, etc.) PROCESS - Coordination of Care X - Simple Patient / Family Education for ongoing care 1 15 []  - 0 Complex (extensive) Patient / Family Education for ongoing care []  - 0 Staff obtains Programmer, systems, Records, Test Results / Process Orders []  - 0 Staff telephones HHA, Nursing Homes / Clarify orders / etc []  - 0 Routine Transfer to another Facility (non-emergent condition) []  - 0 Routine Hospital Admission (non-emergent condition) []  - 0 New Admissions / Biomedical engineer / Ordering NPWT, Apligraf, etc. []  - 0 Emergency Hospital Admission (emergent condition) X- 1 10 Simple Discharge Coordination [name], [name]. ([id number]) []  - 0 Complex (extensive) Discharge Coordination PROCESS - Special Needs []  - Pediatric / Minor Patient Management 0 []  - 0 Isolation Patient Management []  - 0 Hearing / Language / Visual special needs []  - 0 Assessment of Community assistance (transportation, D/C planning, etc.) []  - 0 Additional assistance  / Altered mentation []  - 0 Support Surface(s) Assessment (bed, cushion, seat, etc.) INTERVENTIONS - Wound Cleansing / Measurement []  - Simple Wound Cleansing - one wound  0 X- 2 5 Complex Wound Cleansing - multiple wounds X- 1 5 Wound Imaging (photographs - any number of wounds) []  - 0 Wound Tracing (instead of photographs) []  - 0 Simple Wound Measurement - one wound X- 2 5 Complex Wound Measurement - multiple wounds INTERVENTIONS - Wound Dressings X - Small Wound Dressing one or multiple wounds 2 10 []  - 0 Medium Wound Dressing one or multiple wounds []  - 0 Large Wound Dressing one or multiple wounds []  - 0 Application of Medications - topical []  - 0 Application of Medications - injection INTERVENTIONS - Miscellaneous []  - External ear exam 0 []  - 0 Specimen Collection (cultures, biopsies, blood, body fluids, etc.) []  - 0 Specimen(s) / Culture(s) sent or taken to Lab for analysis []  - 0 Patient Transfer (multiple staff / Civil Service fast streamer / Similar devices) []  - 0 Simple Staple / Suture removal (25 or less) []  - 0 Complex Staple / Suture removal (26 or more) []  - 0 Hypo / Hyperglycemic Management (close monitor of Blood Glucose) []  - 0 Ankle / Brachial Index (ABI) - do not check if billed separately X- 1 5 Vital Signs [name], [name] ([id number]) Has the patient been seen at the hospital within the last three years: Yes Total Score: 100 Level Of Care: New/Established - Level 3 Electronic Signature(s) Signed: 05/11/2019 4:25:43 PM By: Harold Barban Entered By: Harold Barban on 05/11/2019 14:48:21 [name], [name] ([id number]) -------------------------------------------------------------------------------- Encounter Discharge Information Details Patient Name: [name]. Date of Service: 05/11/2019 2:00 PM Medical Record Number: [id number] Patient Account Number: [id number] Date of Birth/Sex: January 28, 1956 (63 y.o. F) Treating RN: Harold Barban Primary Care  Eulalio Reamy: Tandy Gaw Other Clinician: Referring [place] Warriner: Tandy Gaw Treating Zakeria Kulzer/Extender: Melburn Hake, HOYT Weeks in Treatment: 11 Encounter Discharge Information Items Discharge Condition: Stable Ambulatory Status: Ambulatory Discharge Destination: Home Transportation: Private Auto Accompanied By: self Schedule Follow-up Appointment: Yes Clinical Summary of Care: Electronic Signature(s) Signed: 05/11/2019 4:25:43 PM By: Harold Barban Entered By: Harold Barban on 05/11/2019 14:50:18 [name], [name] ([id number]) -------------------------------------------------------------------------------- Lower Extremity Assessment Details Patient Name: Elizabeth Landau C. Date of Service: 05/11/2019 2:00 PM Medical Record Number: [id number] Patient Account Number: [id number] Date of Birth/Sex: [birth year] (63 y.o. F) Treating RN: Cornell Barman Primary Care Pria Klosinski: Tandy Gaw Other Clinician: Referring Luisana Lutzke: Tandy Gaw Treating Xzavion Doswell/Extender: Melburn Hake, HOYT Weeks in Treatment: 11 Vascular Assessment Pulses: Dorsalis Pedis Palpable: [Left:Yes] Electronic Signature(s) Signed: 05/14/2019 5:19:38 PM By: Gretta Cool, BSN, RN, CWS, Kim RN, BSN Entered By: Gretta Cool, BSN, RN, CWS, Kim on 05/11/2019 14:15:46 [name], [name] ([id number]) -------------------------------------------------------------------------------- Multi Wound Chart Details Patient Name: [name]. Date of Service: 05/11/2019 2:00 PM Medical Record Number: [id number] Patient Account Number: [id number] Date of Birth/Sex: August 05, 1955 (63 y.o. F) Treating RN: Harold Barban Primary Care Cline Draheim: Tandy Gaw Other Clinician: Referring Annaliah Rivenbark: Tandy Gaw Treating Darleny Sem/Extender: STONE III, HOYT Weeks in Treatment: 11 Vital Signs Height(in): 62 Pulse(bpm): 64 Weight(lbs): 390 Blood Pressure(mmHg): 163/73 Body Mass Index(BMI): 71 Temperature(F): 98.7 Respiratory Rate 16 (breaths/min): Wound  Assessments Treatment Notes Electronic Signature(s) Signed: 05/11/2019 4:25:43 PM By: Harold Barban Entered By: Harold Barban on 05/11/2019 14:47:11 Dethlefs, [name] ([id number]) -------------------------------------------------------------------------------- Multi-Disciplinary Care Plan Details Patient Name: [name], [name]. Date of Service: 05/11/2019 2:00 PM Medical Record Number: [id number] Patient Account Number: [id number] Date of Birth/Sex: 05/17/1956 (63 y.o. F) Treating RN: Harold Barban Primary Care Lolita Faulds: Tandy Gaw Other Clinician: Referring Ashlynn Gunnels: Tandy Gaw Treating Latoi Giraldo/Extender: STONE III, HOYT Weeks in Treatment: 11 Active Inactive Abuse / Safety /  Falls / Self Care Management Nursing Diagnoses: Potential for falls Goals: Patient will remain injury free related to falls Date Initiated: 02/20/2019 Target Resolution Date: 05/16/2019 Goal Status: Active Interventions: Assess fall risk on admission and as needed Notes: Orientation to the Wound Care Program Nursing Diagnoses: Knowledge deficit related to the wound healing center program Goals: Patient/caregiver will verbalize understanding of the [place] Date Initiated: 02/20/2019 Target Resolution Date: 05/16/2019 Goal Status: Active Interventions: Provide education on orientation to the wound center Notes: Venous Leg Ulcer Nursing Diagnoses: Actual venous Insuffiency (use after diagnosis is confirmed) Goals: Patient will maintain optimal edema control Date Initiated: 02/20/2019 Target Resolution Date: 05/16/2019 Goal Status: Active Interventions: Assess peripheral edema status every visit. [name], [name] ([id number]) Compression as ordered Notes: Wound/Skin Impairment Nursing Diagnoses: Impaired tissue integrity Goals: Ulcer/skin breakdown will heal within 14 weeks Date Initiated: 02/20/2019 Target Resolution Date: 05/16/2019 Goal Status:  Active Interventions: Assess patient/caregiver ability to obtain necessary supplies Assess patient/caregiver ability to perform ulcer/skin care regimen upon admission and as needed Assess ulceration(s) every visit Notes: Electronic Signature(s) Signed: 05/11/2019 4:25:43 PM By: Harold Barban Entered By: Harold Barban on 05/11/2019 14:47:04 [name], [name] ([id number]) -------------------------------------------------------------------------------- Pain Assessment Details Patient Name: [name]. Date of Service: 05/11/2019 2:00 PM Medical Record Number: [id number] Patient Account Number: [id number] Date of Birth/Sex: December 22, 1955 (63 y.o. F) Treating RN: Cornell Barman Primary Care Virjean Boman: Tandy Gaw Other Clinician: Referring Terianna Peggs: Tandy Gaw Treating Modena Bellemare/Extender: STONE III, HOYT Weeks in Treatment: 11 Active Problems Location of Pain Severity and Description of Pain Patient Has Paino No Site Locations Pain Management and Medication Current Pain Management: Goals for Pain Management Patient denies pain at this time. Electronic Signature(s) Signed: 05/14/2019 5:19:38 PM By: Gretta Cool, BSN, RN, CWS, Kim RN, BSN Entered By: Gretta Cool, BSN, RN, CWS, Kim on 05/11/2019 14:08:32 [name] ([id number]) -------------------------------------------------------------------------------- Patient/Caregiver Education Details Patient Name: [name] Date of Service: 05/11/2019 2:00 PM Medical Record Number: [id number] Patient Account Number: [id number] Date of Birth/Gender: 23-May-1956 (63 y.o. F) Treating RN: Harold Barban Primary Care Physician: Tandy Gaw Other Clinician: Referring Physician: Tandy Gaw Treating Physician/Extender: Melburn Hake, HOYT Weeks in Treatment: 11 Education Assessment Education Provided To: Patient Education Topics Provided Wound/Skin Impairment: Handouts: Caring for Your Ulcer Methods: Demonstration, Explain/Verbal Responses:  State content correctly Electronic Signature(s) Signed: 05/11/2019 4:25:43 PM By: Harold Barban Entered By: Harold Barban on 05/11/2019 14:47:37 [name], [name] ([id number]) -------------------------------------------------------------------------------- [place] Details Patient Name: [name]. Date of Service: 05/11/2019 2:00 PM Medical Record Number: [id number] Patient Account Number: [id number] Date of Birth/Sex: 04-Nov-1955 (63 y.o. F) Treating RN: Cornell Barman Primary Care Javana Schey: Tandy Gaw Other Clinician: Referring [name] Ebbert: Tandy Gaw Treating Lateef Juncaj/Extender: STONE III, HOYT Weeks in Treatment: 11 Vital Signs Time Taken: 14:08 Temperature (F): 98.7 Height (in): 62 Pulse (bpm): 64 Weight (lbs): 390 Respiratory Rate (breaths/min): 16 Body Mass Index (BMI): 71.3 Blood Pressure (mmHg): 163/73 Reference Range: 80 - 120 mg / dl Electronic Signature(s) Signed: 05/14/2019 5:19:38 PM By: Gretta Cool, BSN, RN, CWS, Kim RN, BSN Entered By: Gretta Cool, BSN, RN, CWS, Kim on 05/11/2019 14:12:24

## 2019-05-25 ENCOUNTER — Encounter: Payer: Medicaid Other | Admitting: Physician Assistant

## 2019-05-25 ENCOUNTER — Other Ambulatory Visit: Payer: Self-pay

## 2019-05-25 DIAGNOSIS — E11622 Type 2 diabetes mellitus with other skin ulcer: Secondary | ICD-10-CM | POA: Diagnosis not present

## 2019-05-25 NOTE — Progress Notes (Signed)
DAWNI, SCHWERY ([id number]) Visit Report for 05/25/2019 Arrival Information Details Patient Name: [name], [name]. Date of Service: 05/25/2019 1:00 PM Medical Record Number: [id number] Patient Account Number: [id number] Date of Birth/Sex: 1955-10-15 (63 y.o. F) Treating RN: Montey Hora Primary Care Josemiguel Gries: Tandy Gaw Other Clinician: Referring Lillyanne Bradburn: Tandy Gaw Treating Viviann Broyles/Extender: STONE III, HOYT Weeks in Treatment: 13 Visit Information History Since Last Visit Added or deleted any medications: No Patient Arrived: Cane Any new allergies or adverse reactions: No Arrival Time: 13:09 Had a fall or experienced change in No Accompanied By: self activities of daily living that may affect Transfer Assistance: None risk of falls: Patient Identification Verified: Yes Signs or symptoms of abuse/neglect since last visito No Secondary Verification Process Completed: Yes Hospitalized since last visit: No Patient Requires Transmission-Based Precautions: No Implantable device outside of the clinic excluding No Patient Has Alerts: No cellular tissue based products placed in the center since last visit: Has Dressing in Place as Prescribed: Yes Has Compression in Place as Prescribed: Yes Pain Present Now: No Electronic Signature(s) Signed: 05/25/2019 4:21:28 PM By: Montey Hora Entered By: Montey Hora on 05/25/2019 13:10:29 Pehrson, [name] ([id number]) -------------------------------------------------------------------------------- Clinic Level of Care Assessment Details Patient Name: [name]. Date of Service: 05/25/2019 1:00 PM Medical Record Number: [id number] Patient Account Number: [id number] Date of Birth/Sex: 10-31-1955 (63 y.o. F) Treating RN: Harold Barban Primary Care Amoy Steeves: Tandy Gaw Other Clinician: Referring Stephanye Finnicum: Tandy Gaw Treating Thomasa Heidler/Extender: STONE III, HOYT Weeks in Treatment: 13 Clinic Level of Care Assessment  Items TOOL 4 Quantity Score []  - Use when only an EandM is performed on FOLLOW-UP visit 0 ASSESSMENTS - Nursing Assessment / Reassessment X - Reassessment of Co-morbidities (includes updates in patient status) 1 10 X- 1 5 Reassessment of Adherence to Treatment Plan ASSESSMENTS - Wound and Skin Assessment / Reassessment X - Simple Wound Assessment / Reassessment - one wound 1 5 []  - 0 Complex Wound Assessment / Reassessment - multiple wounds []  - 0 Dermatologic / Skin Assessment (not related to wound area) ASSESSMENTS - Focused Assessment []  - Circumferential Edema Measurements - multi extremities 0 []  - 0 Nutritional Assessment / Counseling / Intervention []  - 0 Lower Extremity Assessment (monofilament, tuning fork, pulses) []  - 0 Peripheral Arterial Disease Assessment (using hand held doppler) ASSESSMENTS - Ostomy and/or Continence Assessment and Care []  - Incontinence Assessment and Management 0 []  - 0 Ostomy Care Assessment and Management (repouching, etc.) PROCESS - Coordination of Care X - Simple Patient / Family Education for ongoing care 1 15 []  - 0 Complex (extensive) Patient / Family Education for ongoing care []  - 0 Staff obtains Programmer, systems, Records, Test Results / Process Orders []  - 0 Staff telephones HHA, Nursing Homes / Clarify orders / etc []  - 0 Routine Transfer to another Facility (non-emergent condition) []  - 0 Routine Hospital Admission (non-emergent condition) []  - 0 New Admissions / Biomedical engineer / Ordering NPWT, Apligraf, etc. []  - 0 Emergency Hospital Admission (emergent condition) X- 1 10 Simple Discharge Coordination [name], [name]. ([id number]) []  - 0 Complex (extensive) Discharge Coordination PROCESS - Special Needs []  - Pediatric / Minor Patient Management 0 []  - 0 Isolation Patient Management []  - 0 Hearing / Language / Visual special needs []  - 0 Assessment of Community assistance (transportation, D/C planning, etc.) []  -  0 Additional assistance / Altered mentation []  - 0 Support Surface(s) Assessment (bed, cushion, seat, etc.) INTERVENTIONS - Wound Cleansing / Measurement X - Simple Wound Cleansing - one wound  1 5 []  - 0 Complex Wound Cleansing - multiple wounds X- 1 5 Wound Imaging (photographs - any number of wounds) []  - 0 Wound Tracing (instead of photographs) X- 1 5 Simple Wound Measurement - one wound []  - 0 Complex Wound Measurement - multiple wounds INTERVENTIONS - Wound Dressings X - Small Wound Dressing one or multiple wounds 1 10 []  - 0 Medium Wound Dressing one or multiple wounds []  - 0 Large Wound Dressing one or multiple wounds []  - 0 Application of Medications - topical []  - 0 Application of Medications - injection INTERVENTIONS - Miscellaneous []  - External ear exam 0 []  - 0 Specimen Collection (cultures, biopsies, blood, body fluids, etc.) []  - 0 Specimen(s) / Culture(s) sent or taken to Lab for analysis []  - 0 Patient Transfer (multiple staff / Civil Service fast streamer / Similar devices) []  - 0 Simple Staple / Suture removal (25 or less) []  - 0 Complex Staple / Suture removal (26 or more) []  - 0 Hypo / Hyperglycemic Management (close monitor of Blood Glucose) []  - 0 Ankle / Brachial Index (ABI) - do not check if billed separately X- 1 5 Vital Signs [name], [name] ([id number]) Has the patient been seen at the hospital within the last three years: Yes Total Score: 75 Level Of Care: New/Established - Level 2 Electronic Signature(s) Signed: 05/25/2019 4:27:39 PM By: Harold Barban Entered By: Harold Barban on 05/25/2019 13:32:20 Armas, [name] ([id number]) -------------------------------------------------------------------------------- Encounter Discharge Information Details Patient Name: [name]. Date of Service: 05/25/2019 1:00 PM Medical Record Number: [id number] Patient Account Number: [id number] Date of Birth/Sex: 03-26-1956 (63 y.o. F) Treating RN: Harold Barban Primary Care Ayaz Sondgeroth: Tandy Gaw Other Clinician: Referring Viola Placeres: Tandy Gaw Treating Jaray Boliver/Extender: STONE III, HOYT Weeks in Treatment: 13 Encounter Discharge Information Items Discharge Condition: Stable Ambulatory Status: Cane Discharge Destination: Home Transportation: Private Auto Accompanied By: self Schedule Follow-up Appointment: Yes Clinical Summary of Care: Electronic Signature(s) Signed: 05/25/2019 4:27:39 PM By: Harold Barban Entered By: Harold Barban on 05/25/2019 13:35:59 Escalera, [name] ([id number]) -------------------------------------------------------------------------------- Lower Extremity Assessment Details Patient Name: Elizabeth Landau C. Date of Service: 05/25/2019 1:00 PM Medical Record Number: [id number] Patient Account Number: [id number] Date of Birth/Sex: 1956-02-11 (63 y.o. F) Treating RN: Montey Hora Primary Care Malikhi Ogan: Tandy Gaw Other Clinician: Referring Hetal Proano: Tandy Gaw Treating Dylan Monforte/Extender: STONE III, HOYT Weeks in Treatment: 13 Edema Assessment Assessed: [Left: No] [Right: No] Edema: [Left: Yes] [Right: Yes] Vascular Assessment Pulses: Dorsalis Pedis Palpable: [Left:Yes] [Right:Yes] Electronic Signature(s) Signed: 05/25/2019 4:21:28 PM By: Montey Hora Entered By: Montey Hora on 05/25/2019 13:18:48 Kiraly, [name] ([id number]) -------------------------------------------------------------------------------- Multi Wound Chart Details Patient Name: Elizabeth Landau C. Date of Service: 05/25/2019 1:00 PM Medical Record Number: [id number] Patient Account Number: [id number] Date of Birth/Sex: 08/16/1955 (63 y.o. F) Treating RN: Harold Barban Primary Care Donye Dauenhauer: Tandy Gaw Other Clinician: Referring Shuaib Corsino: Tandy Gaw Treating Rayvn Rickerson/Extender: STONE III, HOYT Weeks in Treatment: 13 Vital Signs Height(in): 62 Pulse(bpm): 64 Weight(lbs): 390 Blood Pressure(mmHg):  176/81 Body Mass Index(BMI): 71 Temperature(F): 97.8 Respiratory Rate 20 (breaths/min): Wound Assessments Treatment Notes Electronic Signature(s) Signed: 05/25/2019 4:27:39 PM By: Harold Barban Entered By: Harold Barban on 05/25/2019 13:31:11 [name] ([id number]) -------------------------------------------------------------------------------- Multi-Disciplinary Care Plan Details Patient Name: [name], [name]. Date of Service: 05/25/2019 1:00 PM Medical Record Number: [id number] Patient Account Number: [id number] Date of Birth/Sex: March 28, 1956 (63 y.o. F) Treating RN: Harold Barban Primary Care Lessie Manigo: Tandy Gaw Other Clinician: Referring Teagen Mcleary: Tandy Gaw Treating Brayden Betters/Extender: STONE III, HOYT Weeks in Treatment:  13 Active Inactive Abuse / Safety / Falls / Self Care Management Nursing Diagnoses: Potential for falls Goals: Patient will remain injury free related to falls Date Initiated: 02/20/2019 Target Resolution Date: 05/16/2019 Goal Status: Active Interventions: Assess fall risk on admission and as needed Notes: Orientation to the Wound Care Program Nursing Diagnoses: Knowledge deficit related to the wound healing center program Goals: Patient/caregiver will verbalize understanding of the [facility] Date Initiated: 02/20/2019 Target Resolution Date: 05/16/2019 Goal Status: Active Interventions: Provide education on orientation to the wound center Notes: Venous Leg Ulcer Nursing Diagnoses: Actual venous Insuffiency (use after diagnosis is confirmed) Goals: Patient will maintain optimal edema control Date Initiated: 02/20/2019 Target Resolution Date: 05/16/2019 Goal Status: Active Interventions: Assess peripheral edema status every visit. [name], [name] ([id number]) Compression as ordered Notes: Wound/Skin Impairment Nursing Diagnoses: Impaired tissue integrity Goals: Ulcer/skin breakdown will heal within 14  weeks Date Initiated: 02/20/2019 Target Resolution Date: 05/16/2019 Goal Status: Active Interventions: Assess patient/caregiver ability to obtain necessary supplies Assess patient/caregiver ability to perform ulcer/skin care regimen upon admission and as needed Assess ulceration(s) every visit Notes: Electronic Signature(s) Signed: 05/25/2019 4:27:39 PM By: Harold Barban Entered By: Harold Barban on 05/25/2019 13:31:03 Piccirilli, [name] ([id number]) -------------------------------------------------------------------------------- Non-Wound Condition Assessment Details Patient Name: [name]. Date of Service: 05/25/2019 1:00 PM Medical Record Number: [id number] Patient Account Number: [id number] Date of Birth/Sex: 1955/10/21 (63 y.o. F) Treating RN: Montey Hora Primary Care Alexia Dinger: Tandy Gaw Other Clinician: Referring Arista Kettlewell: Tandy Gaw Treating Jai Steil/Extender: STONE III, HOYT Weeks in Treatment: 13 Non-Wound Condition: Condition: Lymphedema Location: Leg Side: Right Photos Electronic Signature(s) Signed: 05/25/2019 4:21:28 PM By: Montey Hora Entered By: Montey Hora on 05/25/2019 13:21:04 Hoopingarner, [name] ([id number]) -------------------------------------------------------------------------------- Pain Assessment Details Patient Name: Elizabeth Landau C. Date of Service: 05/25/2019 1:00 PM Medical Record Number: [id number] Patient Account Number: [id number] Date of Birth/Sex: [birth year] (63 y.o. F) Treating RN: Montey Hora Primary Care Kyiah Canepa: Tandy Gaw Other Clinician: Referring Shilynn Hoch: Tandy Gaw Treating Dalina Samara/Extender: STONE III, HOYT Weeks in Treatment: 13 Active Problems Location of Pain Severity and Description of Pain Patient Has Paino Yes Site Locations Pain Location: Pain in Ulcers With Dressing Change: Yes Duration of the Pain. Constant / Intermittento Constant Character of Pain Describe the Pain: Other:  sore Pain Management and Medication Current Pain Management: Electronic Signature(s) Signed: 05/25/2019 4:21:28 PM By: Montey Hora Entered By: Montey Hora on 05/25/2019 13:10:49 Kroeze, [name] ([id number]) -------------------------------------------------------------------------------- Patient/Caregiver Education Details Patient Name: [name]. Date of Service: 05/25/2019 1:00 PM Medical Record Number: [id number] Patient Account Number: [id number] Date of Birth/Gender: 06-13-1956 (63 y.o. F) Treating RN: Harold Barban Primary Care Physician: Tandy Gaw Other Clinician: Referring Physician: Tandy Gaw Treating Physician/Extender: Melburn Hake, HOYT Weeks in Treatment: 13 Education Assessment Education Provided To: Patient Education Topics Provided Wound/Skin Impairment: Handouts: Caring for Your Ulcer Methods: Demonstration, Explain/Verbal Responses: State content correctly Electronic Signature(s) Signed: 05/25/2019 4:27:39 PM By: Harold Barban Entered By: Harold Barban on 05/25/2019 13:31:33 Kuiken, [name] ([id number]) -------------------------------------------------------------------------------- Vitals Details Patient Name: [name]. Date of Service: 05/25/2019 1:00 PM Medical Record Number: [id number] Patient Account Number: [id number] Date of Birth/Sex: 1956-03-04 (63 y.o. F) Treating RN: Montey Hora Primary Care Nya Monds: Tandy Gaw Other Clinician: Referring Akhila Mahnken: Tandy Gaw Treating Cathlin Buchan/Extender: STONE III, HOYT Weeks in Treatment: 13 Vital Signs Time Taken: 13:10 Temperature (F): 97.8 Height (in): 62 Pulse (bpm): 64 Weight (lbs): 390 Respiratory Rate (breaths/min): 20 Body Mass Index (BMI): 71.3 Blood Pressure (mmHg): 176/81 Reference Range:  80 - 120 mg / dl Electronic Signature(s) Signed: 05/25/2019 4:21:28 PM By: Montey Hora Entered By: Montey Hora on 05/25/2019 13:13:19

## 2019-05-25 NOTE — Progress Notes (Addendum)
CHARMAN, SPROULS ([id number]) Visit Report for 05/25/2019 Chief Complaint Document Details Patient Name: [name], [name]. Date of Service: 05/25/2019 1:00 PM Medical Record Number: [id number] Patient Account Number: [id number] Date of Birth/Sex: [birth year] (63 y.o. F) Treating RN: Harold Barban Primary Care Provider: Tandy Gaw Other Clinician: Referring Provider: Tandy Gaw Treating Provider/Extender: Melburn Hake, HOYT Weeks in Treatment: 13 Information Obtained from: Patient Chief Complaint Left LE Ulcer Electronic Signature(s) Signed: 05/25/2019 1:13:31 PM By: Worthy Keeler PA-C Entered By: Worthy Keeler on 05/25/2019 13:13:30 Kantz, [name] ([id number]) -------------------------------------------------------------------------------- HPI Details Patient Name: [name]. Date of Service: 05/25/2019 1:00 PM Medical Record Number: [id number] Patient Account Number: [id number] Date of Birth/Sex: 24-May-1956 (63 y.o. F) Treating RN: Harold Barban Primary Care Provider: Tandy Gaw Other Clinician: Referring Provider: Tandy Gaw Treating Provider/Extender: STONE III, HOYT Weeks in Treatment: 13 History of Present Illness HPI Description: The patient is a 63 year old female with history of hypertension and a long-standing history of bilateral lower extremity lymphedema (first presented on 4/2) . She has had open ulcers in the past which have always responded to compression therapy. She had briefly been to a lymphedema clinic in the past which helped her at the time. this time around she stopped treatment of her lymphedema pumps approximately 2 weeks ago because of some pain in the knees and then noticed the right leg getting worse. She was seen by her PCP who put her on clindamycin 4 times a day 2 days ago. The patient has seen [facility] and Dr. Delana Meyer had seen her last year where a vascular study including venous and arterial duplex studies were within normal  limits. he had recommended compression stockings and lymphedema pumps and the patient has been using this in about 2 weeks ago. She is known to be diabetic but in the past few time she's gone to her primary care doctor her hemoglobin A1c has been normal. 02/11/2015 - after her last visit she took my advice and went to the ER regarding the progressive cellulitis of her right lower extremity and she was admitted between July 17 and 22nd. She received IV antibiotics and then was sent home on a course of steroid-induced and oral antibiotics. She has improved much since then. 02/17/2015 -- she has been doing fine and the weeping of her legs has remarkably gone down. She has no fresh issues. READMISSION 01/15/18 This patient was given this clinic before most recently in 2016 seen by Dr. Con Memos. She has massive bilateral lymphedema and over the last 2 months this had weeping edema out of the left leg. She has compression pumps but her compliance with these has been minimal. She has [facility] they've been using TCA/ABDs/kerlix under an Ace wrap.she has had recent problems with cellulitis. She was apparently seen in the ER and 12/23/17 and given clindamycin. She was then followed by her primary doctor and given doxycycline and Keflex. The pain seems to have settled down. In April 2018 the patient had arterial studies done at [facility]. This showed triphasic waveforms throughout the right leg and mostly triphasic waveforms on the left except for monophasic at the posterior tibial artery distally. She was not felt to have evidence of right lower extremity arterial stenosis or significant problems on the left side. 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 She also had a right lower extremity venous Doppler in January 2018 this was limited by the patient's body habitus and lymphedema. Most of the  proximal veins were not visualized The patient presents with  an area of denuded skin on the anterior medial part of the left calf. There is weeping edema fluid here. 01/22/18; the patient has somewhat better edema control using her compression pumps twice a day and as a result she has much better epithelialization on the left anterior calf area. Only a small open area remains. 01/29/18; the patient has been compliant with her compression pumps. Both the areas on her calf that healed. The remaining area on the left anterior leg is fully epithelialized Readmission: 02/20/2019 upon evaluation today patient presents for reevaluation due to issues that she is having with the bilateral lower extremities. She actually has wounds open on both legs. On the right she has an area in the crease of her leg on the right around the knee region which is actually draining quite a bit and actually has some fungal type appearance to it. She has been on nystatin powder that seems to have helped to some degree. In regard to the left lower extremity this is actually in the [place]. ([id number]) lower portion of her leg closer to the ankle and again is continuing to drain as well unfortunately. There does not appear to be any signs of active infection at this time which is good news. No fevers, chills, nausea, vomiting, or diarrhea. She tells me that since she was seen last year she is actually been doing quite well for the most part with regard to her lower extremities. Unfortunately she now is experiencing a little bit more drainage at this time. She is concerned about getting this under control so that it does not get significantly worse. 02/27/2019 on evaluation today patient appears to be doing somewhat better in regard to her bilateral lower extremity wounds. She has been tolerating the dressing changes without complication. Fortunately there is no signs of active infection at this point. No fevers, chills, nausea, vomiting, or diarrhea. She did get her dressing  supplies which is excellent news she was extremely excited to get these. She also got paperwork from [facility] for their financial assistance program where they may be able to help her out in the future if needed with supplies at discounted prices. 03/06/2019 on evaluation today patient appears to be doing a little worse with regard to both areas of weeping on her bilateral lower extremities. This is around the right medial knee and just above the left ankle. With that being said she is unfortunately not doing as well as I would like to see. I feel like she may need to potentially go see someone at the lymphedema clinic as the wraps that she needs or even beyond what we can do here at the wound care center. She really does not have wounds she just has open areas of weeping that are causing some difficulty for her. Subsequently because of this and the moisture I am concerned about the potential for infection I am going to likely give her a prophylactic antibiotic today, Keflex, just to be on the safe side. Nonetheless again there is no obvious signs of active infection at this time. 03/13/2019 on evaluation today patient appears to be doing well with regard to her bilateral lower extremities where she has been weeping compared to even last week's evaluation. I see some areas of new skin growth which is excellent and overall I am very pleased with how things seem to be progressing. No fevers, chills, nausea, vomiting, or diarrhea. 03/20/2019 on evaluation  today patient unfortunately is continuing to have issues with significant edema of the left lower extremity. Her right side seems to be doing much better. Unfortunately her left side is showing increased weeping of the lower portion of her leg. This is quite unfortunate obviously we were hoping to get her into the lymphedema clinic they really do not seem to when I see her how if she is draining. Despite the fact this is really not wound related but more  lymphedema weeping related. Nonetheless I do not know that this can be helpful for her to even go for that appointment since again I am not sure there is much that they would actually do at this point. We may need to try a 4 layer compression wrap as best we can on her leg. She is on the Augmentin currently although I am still concerned about whether or not there could be potentially something going on infection wise I would obtain a culture though I understand is not the best being that is a surface culture I just 1 to make sure I do not seem to be missing anything. 03/27/2019 on evaluation today patient appears to be doing much better in regard to the left lower extremity compared to last week. Last week she had tremendous weeping which I think was subsequent to infection now she seems to be doing much better and very pleased. This is not completely healed but there is a lot of new skin growth and it has dried out quite a bit. Overall I think that we are doing well with how things are moving along at this time. No fevers, chills, nausea, vomiting, or diarrhea. 04/03/2019 evaluation today patient appears to be doing a little worse this week compared to last time I saw her. I think this may be due to the fact that she is having issues with not being able to sleep in her bed at least not until last night. She is therefore been in a lift chair and subsequently has also had issues with not been able to use her pumps since she could not get in bed. With that being said the patient overall seems to be doing okay I do think I may want extend the antibiotic for a little bit longer at least until we can see if her edema and her weeping gets better and if it is then obviously I can always discontinue the antibiotics as of next week however I want her to continue to have it over the next week. 04/10/2019 on evaluation today patient unfortunately is still doing poorly with regard to her left lower extremity. Her  right is all things considering doing fairly well. On the left however she continues to have spreading of the area of infection and weeping which appears to be even a larger surface area than noted last week. She did have a positive culture for Pseudomonas in particular which seems to have been of concern she still has green/yellow discharge consistent with Pseudomonas and subsequently a tremendous amount of it. This has me obviously still concerned about the infection not really clearing up despite the fact that on culture it appears the Cipro should have been a good option for treating this. I think she may at this point need IV antibiotics since things are not doing better I do not want to get worse and cause sepsis. She is in agreement with the plan and believes as well that she likely does need to go to the hospital for  IV vancomycin. Or something of the like depending on what the recommendation is from the ER. 04/17/2019 on evaluation today patient appears to be doing excellent in regard to her lower extremity on the left. She was in the hospital for several days from when I sent her last we saw her until just this past Tuesday. Fortunately her drainage is significantly improved and in fact is mostly clear. There is just a couple small areas that may still drain a little bit she states JAIRY, GILHOOLEY. ([id number]) that the [employer] they prescribed for her at discharge she went picked up from pharmacy and got home but has not been able to find it since. She is looked everywhere. She is wondering if I will replace that for her today I will be more than happy to do that. 05/01/2019 on evaluation today patient actually appears to be doing quite well with regard to her lower extremities. She occasionally is having areas that will leak and then heal up mainly when a piece of the fibrotic skin pops off but fortunately she is not having any signs of active infection at this time. Overall she also  really does not have any obvious weeping at this time. I do believe however she really needs some compression wraps and I think this may be a good time to get her back to the lymphedema clinic. 05/11/2019 on evaluation today patient actually appears to be doing quite well with regard to her bilateral lower extremities. She occasionally will have a small area that we per another but in general seems to be completely healed which is great news. Overall very pleased with how everything seems to be progressing. She does have her appointment with lymphedema clinic on November 18. 05/25/2019 on evaluation today patient appears to be doing well with regard to her left lower extremity. I am very pleased in this regard. In regard to her right leg this actually did start draining more I think it is mainly due to the fact that her leg is more swollen. I am not seeing any obvious signs of infection at this time although that is definitely something were obviously acutely aware of simply due to the fact that she had an issue not too far back with exactly this issue. Nonetheless I do feel like that lymphedema clinic would still be beneficial for her. I explained obviously if they are not able to do anything treatment wise on the right leg we could at least have them treat her left leg and then proceed from there. The patient is really in agreement with that plan. If they are able to do both as the drainage slows down that I would be happy to let them handle both. Electronic Signature(s) Signed: 05/25/2019 1:47:11 PM By: Worthy Keeler PA-C Entered By: Worthy Keeler on 05/25/2019 13:47:10 Whipp, [name] ([id number]) -------------------------------------------------------------------------------- Physical Exam Details Patient Name: [name]. Date of Service: 05/25/2019 1:00 PM Medical Record Number: [id number] Patient Account Number: [id number] Date of Birth/Sex: [birth year] (63 y.o. F) Treating RN:  Harold Barban Primary Care Provider: Tandy Gaw Other Clinician: Referring Provider: Tandy Gaw Treating Provider/Extender: STONE III, HOYT Weeks in Treatment: 13 Constitutional Obese and well-hydrated in no acute distress. Respiratory normal breathing without difficulty. clear to auscultation bilaterally. Cardiovascular regular rate and rhythm with normal S1, S2. Psychiatric this patient is able to make decisions and demonstrates good insight into disease process. Alert and Oriented x 3. pleasant and cooperative. Notes Patient's wound bed currently showed signs  of really just being superficial skin breakdown and weeping there did not appear to be any true and obvious sores noted at this point which is good news. Overall I am pleased in that regard. With that being said I still do believe that she has a lot of lymphedema and swelling and getting some of this down through by way of the lymphedema clinic would be extremely beneficial for her. Electronic Signature(s) Signed: 05/25/2019 1:47:58 PM By: Worthy Keeler PA-C Entered By: Worthy Keeler on 05/25/2019 13:47:58 [name], [name] ([id number]) -------------------------------------------------------------------------------- Physician Orders Details Patient Name: [name] Date of Service: 05/25/2019 1:00 PM Medical Record Number: [id number] Patient Account Number: [id number] Date of Birth/Sex: February 28, 1956 (63 y.o. F) Treating RN: Harold Barban Primary Care Provider: Tandy Gaw Other Clinician: Referring Provider: Tandy Gaw Treating Provider/Extender: STONE III, HOYT Weeks in Treatment: 13 Verbal / Phone Orders: No Diagnosis Coding ICD-10 Coding Code Description I89.0 Lymphedema, not elsewhere classified L97.822 Non-pressure chronic ulcer of other part of left lower leg with fat layer exposed L97.812 Non-pressure chronic ulcer of other part of right lower leg with fat layer exposed Wound Cleansing o  Dial antibacterial soap, wash wounds, rinse and pat dry prior to dressing wounds o May Shower, gently pat wound dry prior to applying new dressing. Primary Wound Dressing o Silver Alginate - Nystatin powder under silver alginate Secondary Dressing o ABD and Kerlix/Conform o XtraSorb Dressing Change Frequency o Change dressing every other day. - and as needed Follow-up Appointments o Return Appointment in 1 week. Edema Control o Compression Pump: Use compression pump on left lower extremity for 60 minutes, twice daily. o Compression Pump: Use compression pump on right lower extremity for 60 minutes, twice daily. o Other: - ACE Wraps Electronic Signature(s) Signed: 05/25/2019 4:27:39 PM By: Harold Barban Signed: 05/25/2019 4:45:50 PM By: Worthy Keeler PA-C Entered By: Harold Barban on 05/25/2019 13:36:54 [name], [name] ([id number]) -------------------------------------------------------------------------------- Problem List Details Patient Name: [name], [name] Date of Service: 05/25/2019 1:00 PM Medical Record Number: [id number] Patient Account Number: [id number] Date of Birth/Sex: 11/02/1955 (63 y.o. F) Treating RN: Harold Barban Primary Care Provider: Tandy Gaw Other Clinician: Referring Provider: Tandy Gaw Treating Provider/Extender: STONE III, HOYT Weeks in Treatment: 13 Active Problems ICD-10 Evaluated Encounter Code Description Active Date Today Diagnosis I89.0 Lymphedema, not elsewhere classified 02/20/2019 No Yes L97.822 Non-pressure chronic ulcer of other part of left lower leg with 02/20/2019 No Yes fat layer exposed L97.812 Non-pressure chronic ulcer of other part of right lower leg 02/20/2019 No Yes with fat layer exposed Inactive Problems Resolved Problems Electronic Signature(s) Signed: 05/25/2019 1:13:24 PM By: Worthy Keeler PA-C Entered By: Worthy Keeler on 05/25/2019 13:13:24 Marciel, [name]  ([id number]) -------------------------------------------------------------------------------- Progress Note Details Patient Name: [name]. Date of Service: 05/25/2019 1:00 PM Medical Record Number: [id number] Patient Account Number: [id number] Date of Birth/Sex: February 10, 1956 (63 y.o. F) Treating RN: Harold Barban Primary Care Provider: Tandy Gaw Other Clinician: Referring Provider: Tandy Gaw Treating Provider/Extender: STONE III, HOYT Weeks in Treatment: 13 Subjective Chief Complaint Information obtained from Patient Left LE Ulcer History of Present Illness (HPI) The patient is a 63 year old female with history of hypertension and a long-standing history of bilateral lower extremity lymphedema (first presented on 4/2) . She has had open ulcers in the past which have always responded to compression therapy. She had briefly been to a lymphedema clinic in the past which helped her at the time. this time around she stopped treatment of her  lymphedema pumps approximately 2 weeks ago because of some pain in the knees and then noticed the right leg getting worse. She was seen by her PCP who put her on clindamycin 4 times a day 2 days ago. The patient has seen [facility] and Dr. Delana Meyer had seen her last year where a vascular study including venous and arterial duplex studies were within normal limits. he had recommended compression stockings and lymphedema pumps and the patient has been using this in about 2 weeks ago. She is known to be diabetic but in the past few time she's gone to her primary care doctor her hemoglobin A1c has been normal. 02/11/2015 - after her last visit she took my advice and went to the ER regarding the progressive cellulitis of her right lower extremity and she was admitted between July 17 and 22nd. She received IV antibiotics and then was sent home on a course of steroid-induced and oral antibiotics. She has improved much since then. 02/17/2015 -- she has  been doing fine and the weeping of her legs has remarkably gone down. She has no fresh issues. READMISSION 01/15/18 This patient was given this clinic before most recently in 2016 seen by Dr. Con Memos. She has massive bilateral lymphedema and over the last 2 months this had weeping edema out of the left leg. She has compression pumps but her compliance with these has been minimal. She has [facility] they've been using TCA/ABDs/kerlix under an Ace wrap.she has had recent problems with cellulitis. She was apparently seen in the ER and 12/23/17 and given clindamycin. She was then followed by her primary doctor and given doxycycline and Keflex. The pain seems to have settled down. In April 2018 the patient had arterial studies done at [facility]. This showed triphasic waveforms throughout the right leg and mostly triphasic waveforms on the left except for monophasic at the posterior tibial artery distally. She was not felt to have evidence of right lower extremity arterial stenosis or significant problems on the left side. 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 She also had a right lower extremity venous Doppler in January 2018 this was limited by the patient's body habitus and lymphedema. Most of the proximal veins were not visualized The patient presents with an area of denuded skin on the anterior medial part of the left calf. There is weeping edema fluid here. 01/22/18; the patient has somewhat better edema control using her compression pumps twice a day and as a result she has much better epithelialization on the left anterior calf area. Only a small open area remains. 01/29/18; the patient has been compliant with her compression pumps. Both the areas on her calf that healed. The remaining area on the left anterior leg is fully epithelialized [name], [name]. ([id number]) Readmission: 02/20/2019 upon evaluation today patient presents for  reevaluation due to issues that she is having with the bilateral lower extremities. She actually has wounds open on both legs. On the right she has an area in the crease of her leg on the right around the knee region which is actually draining quite a bit and actually has some fungal type appearance to it. She has been on nystatin powder that seems to have helped to some degree. In regard to the left lower extremity this is actually in the lower portion of her leg closer to the ankle and again is continuing to drain as well unfortunately. There does not appear to be any signs of  active infection at this time which is good news. No fevers, chills, nausea, vomiting, or diarrhea. She tells me that since she was seen last year she is actually been doing quite well for the most part with regard to her lower extremities. Unfortunately she now is experiencing a little bit more drainage at this time. She is concerned about getting this under control so that it does not get significantly worse. 02/27/2019 on evaluation today patient appears to be doing somewhat better in regard to her bilateral lower extremity wounds. She has been tolerating the dressing changes without complication. Fortunately there is no signs of active infection at this point. No fevers, chills, nausea, vomiting, or diarrhea. She did get her dressing supplies which is excellent news she was extremely excited to get these. She also got paperwork from [facility] for their financial assistance program where they may be able to help her out in the future if needed with supplies at discounted prices. 03/06/2019 on evaluation today patient appears to be doing a little worse with regard to both areas of weeping on her bilateral lower extremities. This is around the right medial knee and just above the left ankle. With that being said she is unfortunately not doing as well as I would like to see. I feel like she may need to potentially go see someone at  the lymphedema clinic as the wraps that she needs or even beyond what we can do here at the wound care center. She really does not have wounds she just has open areas of weeping that are causing some difficulty for her. Subsequently because of this and the moisture I am concerned about the potential for infection I am going to likely give her a prophylactic antibiotic today, Keflex, just to be on the safe side. Nonetheless again there is no obvious signs of active infection at this time. 03/13/2019 on evaluation today patient appears to be doing well with regard to her bilateral lower extremities where she has been weeping compared to even last week's evaluation. I see some areas of new skin growth which is excellent and overall I am very pleased with how things seem to be progressing. No fevers, chills, nausea, vomiting, or diarrhea. 03/20/2019 on evaluation today patient unfortunately is continuing to have issues with significant edema of the left lower extremity. Her right side seems to be doing much better. Unfortunately her left side is showing increased weeping of the lower portion of her leg. This is quite unfortunate obviously we were hoping to get her into the lymphedema clinic they really do not seem to when I see her how if she is draining. Despite the fact this is really not wound related but more lymphedema weeping related. Nonetheless I do not know that this can be helpful for her to even go for that appointment since again I am not sure there is much that they would actually do at this point. We may need to try a 4 layer compression wrap as best we can on her leg. She is on the Augmentin currently although I am still concerned about whether or not there could be potentially something going on infection wise I would obtain a culture though I understand is not the best being that is a surface culture I just 1 to make sure I do not seem to be missing anything. 03/27/2019 on evaluation today  patient appears to be doing much better in regard to the left lower extremity compared to last week. Last week  she had tremendous weeping which I think was subsequent to infection now she seems to be doing much better and very pleased. This is not completely healed but there is a lot of new skin growth and it has dried out quite a bit. Overall I think that we are doing well with how things are moving along at this time. No fevers, chills, nausea, vomiting, or diarrhea. 04/03/2019 evaluation today patient appears to be doing a little worse this week compared to last time I saw her. I think this may be due to the fact that she is having issues with not being able to sleep in her bed at least not until last night. She is therefore been in a lift chair and subsequently has also had issues with not been able to use her pumps since she could not get in bed. With that being said the patient overall seems to be doing okay I do think I may want extend the antibiotic for a little bit longer at least until we can see if her edema and her weeping gets better and if it is then obviously I can always discontinue the antibiotics as of next week however I want her to continue to have it over the next week. 04/10/2019 on evaluation today patient unfortunately is still doing poorly with regard to her left lower extremity. Her right is all things considering doing fairly well. On the left however she continues to have spreading of the area of infection and weeping which appears to be even a larger surface area than noted last week. She did have a positive culture for Pseudomonas in particular which seems to have been of concern she still has green/yellow discharge consistent with Pseudomonas and subsequently a tremendous amount of it. This has me obviously still concerned about the infection not really clearing up despite the fact that on culture it appears the Cipro should have been a good option for treating this. I  think she may at this point need IV antibiotics since things are not doing better I do not want to get worse and cause sepsis. She is in agreement [name], [name]. ([id number]) with the plan and believes as well that she likely does need to go to the hospital for IV vancomycin. Or something of the like depending on what the recommendation is from the ER. 04/17/2019 on evaluation today patient appears to be doing excellent in regard to her lower extremity on the left. She was in the hospital for several days from when I sent her last we saw her until just this past Tuesday. Fortunately her drainage is significantly improved and in fact is mostly clear. There is just a couple small areas that may still drain a little bit she states that the [facility] they prescribed for her at discharge she went picked up from pharmacy and got home but has not been able to find it since. She is looked everywhere. She is wondering if I will replace that for her today I will be more than happy to do that. 05/01/2019 on evaluation today patient actually appears to be doing quite well with regard to her lower extremities. She occasionally is having areas that will leak and then heal up mainly when a piece of the fibrotic skin pops off but fortunately she is not having any signs of active infection at this time. Overall she also really does not have any obvious weeping at this time. I do believe however she really needs some compression wraps  and I think this may be a good time to get her back to the lymphedema clinic. 05/11/2019 on evaluation today patient actually appears to be doing quite well with regard to her bilateral lower extremities. She occasionally will have a small area that we per another but in general seems to be completely healed which is great news. Overall very pleased with how everything seems to be progressing. She does have her appointment with lymphedema clinic on November 18. 05/25/2019 on evaluation  today patient appears to be doing well with regard to her left lower extremity. I am very pleased in this regard. In regard to her right leg this actually did start draining more I think it is mainly due to the fact that her leg is more swollen. I am not seeing any obvious signs of infection at this time although that is definitely something were obviously acutely aware of simply due to the fact that she had an issue not too far back with exactly this issue. Nonetheless I do feel like that lymphedema clinic would still be beneficial for her. I explained obviously if they are not able to do anything treatment wise on the right leg we could at least have them treat her left leg and then proceed from there. The patient is really in agreement with that plan. If they are able to do both as the drainage slows down that I would be happy to let them handle both. Patient History Information obtained from Patient. Family History Cancer - Father, Diabetes - Father, Heart Disease - Mother, Hypertension - Mother,Father, Seizures - Mother, Stroke - Mother, Thyroid Problems - Mother,Siblings, No family history of Hereditary Spherocytosis, Kidney Disease, Lung Disease, Tuberculosis. Social History Former smoker - uses snuff, Marital Status - Single, Alcohol Use - Rarely, Drug Use - No History, Caffeine Use - Daily. 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 ([id number]) Denies history of Type I Diabetes, Type II Diabetes Genitourinary Denies history of End Stage Renal Disease Immunological Denies history of Lupus Erythematosus, Raynaud s, Scleroderma Integumentary (Skin) Denies history of History of Burn, History of pressure wounds Musculoskeletal Patient has history of Gout, Osteoarthritis Denies history of Rheumatoid Arthritis, Osteomyelitis Neurologic Denies history of Dementia, Neuropathy, Quadriplegia, Paraplegia, Seizure Disorder Oncologic Denies history of Received Chemotherapy, Received Radiation Psychiatric Denies history of Anorexia/bulimia, Confinement Anxiety Review of Systems (ROS) Constitutional Symptoms (General Health) Denies complaints or symptoms of Fatigue, Fever, Chills, Marked Weight Change. Respiratory Denies complaints or symptoms of Chronic or frequent coughs, Shortness of Breath. Cardiovascular Complains or has symptoms of LE edema. Denies complaints or symptoms of Chest pain. Psychiatric Denies complaints or symptoms of Anxiety, Claustrophobia. Objective Constitutional Obese and well-hydrated in no acute distress. Vitals Time Taken: 1:10 PM, Height: 62 in, Weight: 390 lbs, BMI: 71.3, Temperature: 97.8 F, Pulse: 64 bpm, Respiratory Rate: 20 breaths/min, Blood  Pressure: 176/81 mmHg. Respiratory normal breathing without difficulty. clear to auscultation bilaterally. Cardiovascular regular rate and rhythm with normal S1, S2. Psychiatric this patient is able to make decisions and demonstrates good insight into disease process. Alert and  Oriented x 3. pleasant and cooperative. General Notes: Patient's wound bed currently showed signs of really just being superficial skin breakdown and weeping there did not appear to be any true and obvious sores noted at this point which is good news. Overall I am pleased in that regard. With that being said I still do believe that she has a lot of lymphedema and swelling and getting some of this down through by [name], [name] ([id number]) way of the lymphedema clinic would be extremely beneficial for her. Other Condition(s) Patient presents with Lymphedema located on the Right Leg. Assessment Active Problems ICD-10 Lymphedema, not elsewhere classified Non-pressure chronic ulcer of other part of left lower leg with fat layer exposed Non-pressure chronic ulcer of other part of right lower leg with fat layer exposed Plan Wound Cleansing: Dial antibacterial soap, wash wounds, rinse and pat dry prior to dressing wounds May Shower, gently pat wound dry prior to applying new dressing. Primary Wound Dressing: Silver Alginate - Nystatin powder under silver alginate Secondary Dressing: ABD and Kerlix/Conform XtraSorb Dressing Change Frequency: Change dressing every other day. - and as needed Follow-up Appointments: Return Appointment in 1 week. Edema Control: Compression Pump: Use compression pump on left lower extremity for 60 minutes, twice daily. Compression Pump: Use compression pump on right lower extremity for 60 minutes, twice daily. Other: - ACE Wraps 1. My suggestion at this time is that we continue with the nystatin powder and then in the clinic here use a silver alginate dressing followed by Lauraine Rinne today to try to help absorb some of this fluid hopefully that will help to calm things down. 2. I am also going to suggest that she continue with the Ace wrap as that is really the best thing to wrap this area at this point. At least until she can see lymphedema clinic. 3. I would  still like for her to get in with lymphedema clinic as soon as possible she is postop an appointment on Wednesday hopefully they will be able to treat both legs based on the amount of drainage if not then definitely the left leg and we will do what we can do to get the right leg under better control although I am very limited in this regard. We will see patient back for reevaluation in 1 week here in the clinic. If anything worsens or changes patient will contact our office for additional recommendations. Electronic Signature(s) AHTZIRI, ALBARRAN ([id number]) Signed: 05/25/2019 1:48:43 PM By: Worthy Keeler PA-C Entered By: Worthy Keeler on 05/25/2019 13:48:42 [name], [name] ([id number]) -------------------------------------------------------------------------------- ROS/PFSH Details Patient Name: [name] Date of Service: 05/25/2019 1:00 PM Medical Record Number: [id number] Patient Account Number: [id number] Date of Birth/Sex: 03/07/1956 (63 y.o. 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 Negative for: Aspiration; Chronic Obstructive Pulmonary Disease (COPD); Pneumothorax Cardiovascular Complaints and Symptoms: Positive for: LE edema Negative for:  Chest pain Medical History: Positive for: Hypertension Negative for: Angina; Arrhythmia; Congestive Heart Failure; Coronary Artery Disease; Deep Vein Thrombosis; Hypotension; Myocardial Infarction; Peripheral  Arterial Disease; Peripheral Venous Disease; Phlebitis; Vasculitis Psychiatric Complaints and Symptoms: Negative for: Anxiety; Claustrophobia Medical History: Negative for: Anorexia/bulimia; Confinement Anxiety Eyes Medical History: Positive for: Cataracts Negative for: Glaucoma; Optic Neuritis Ear/Nose/Mouth/Throat Medical History: Negative for: Chronic sinus problems/congestion; Middle ear problems Hematologic/Lymphatic Medical History: Positive for: Lymphedema [name], [name]. ([id number]) Negative for: Anemia; Hemophilia; Human Immunodeficiency Virus; Sickle Cell Disease Gastrointestinal Medical History: Negative for: Cirrhosis ; Colitis; Crohnos; Hepatitis A; Hepatitis B; Hepatitis C Endocrine Medical History: Negative for: Type I Diabetes; Type II Diabetes Genitourinary Medical History: Negative for: End Stage Renal Disease Immunological Medical History: Negative for: Lupus Erythematosus; Raynaudos; Scleroderma Integumentary (Skin) Medical History: Negative for: History of Burn; History of pressure wounds Musculoskeletal Medical History: Positive for: Gout; Osteoarthritis Negative for: Rheumatoid Arthritis; Osteomyelitis Neurologic Medical History: Negative for: Dementia; Neuropathy; Quadriplegia; Paraplegia; Seizure Disorder Oncologic Medical History: Negative for: Received Chemotherapy; Received Radiation HBO Extended History Items Eyes: Cataracts Immunizations Pneumococcal Vaccine: Received Pneumococcal Vaccination: Yes Implantable Devices No devices added Family and Social History Cancer: Yes - Father; Diabetes: Yes - Father; Heart Disease: Yes - Mother; Hereditary Spherocytosis: No; Hypertension: Yes - Mother,Father; Kidney Disease: No; Lung Disease: No; Seizures: Yes - Mother; Stroke: Yes - Mother; Thyroid Problems: Yes - Mother,Siblings; Tuberculosis: No; Former smoker - uses snuff; Marital Status - Single; Alcohol Use: Rarely; Drug Use: JONNAH, MCLEISH C. ([id number]) No History; Caffeine Use: Daily; Financial Concerns: No; Food, Clothing or Shelter Needs: No; Support System Lacking: No; Transportation Concerns: No Physician Affirmation I have reviewed and agree with the above information. Electronic Signature(s) Signed: 05/25/2019 4:27:39 PM By: Harold Barban Signed: 05/25/2019 4:45:50 PM By: Worthy Keeler PA-C Entered By: Worthy Keeler on 05/25/2019 13:47:34 [name], [name] ([id number]) -------------------------------------------------------------------------------- SuperBill Details Patient Name: [name]. Date of Service: 05/25/2019 Medical Record Number: [id number] Patient Account Number: [id number] Date of Birth/Sex: 06-May-1956 (63 y.o. F) Treating RN: Harold Barban Primary Care Provider: Tandy Gaw Other Clinician: Referring Provider: Tandy Gaw Treating Provider/Extender: STONE III, HOYT Weeks in Treatment: 13 Diagnosis Coding ICD-10 Codes Code Description I89.0 Lymphedema, not elsewhere classified L97.822 Non-pressure chronic ulcer of other part of left lower leg with fat layer exposed L97.812 Non-pressure chronic ulcer of other part of right lower leg with fat layer exposed Facility Procedures CPT4 Code: [id number] Description: [id number] - WOUND CARE VISIT-LEV 2 EST PT Modifier: Quantity: 1 Physician Procedures CPT4 Code Description: [id number] - WC PHYS LEVEL 4 - EST PT ICD-10 Diagnosis Description I89.0 Lymphedema, not elsewhere classified L97.822 Non-pressure chronic ulcer of other part of left lower leg wit L97.812 Non-pressure chronic ulcer of other  part of right lower leg wi Modifier: h fat layer expos th fat layer expo Quantity: 1 ed sed Electronic Signature(s) Signed: 05/25/2019 1:48:58 PM By: Worthy Keeler PA-C Entered By: Worthy Keeler on 05/25/2019 13:48:58

## 2019-05-27 ENCOUNTER — Ambulatory Visit: Payer: Medicaid Other | Attending: Physician Assistant | Admitting: Occupational Therapy

## 2019-05-27 ENCOUNTER — Other Ambulatory Visit: Payer: Self-pay

## 2019-05-27 DIAGNOSIS — I890 Lymphedema, not elsewhere classified: Secondary | ICD-10-CM | POA: Diagnosis present

## 2019-05-27 NOTE — Patient Instructions (Signed)
Lymphedema Precautions   Dopler study required prior to participating in Complete Decongestive Therapy (CDT) for lymphedema (LE) care  to rule out DVT   If you experience atypical shortness of breath, or notice any signs /symptoms of skin infection (aka cellulitis) remove all compression wraps/ garments, discontinue manual lymphatic drainage (MLD), and report symptoms to your physician immediately. Discontinue MLD and compression for 72 hours after you take your first oral antibiotic so not to spread the infection.   Lymphedema Self- Care Instructions  1. EXERCISE: Perform lymphatic pumping there ex 2 x a day. While wearing your compression wraps or garments. Perform 10 reps of each exercise bilaterally and be sure to perform them in order. Don;t skip around!  OMIT PARTIAL SIT UP  2. MLD: Perform simple self-Manual Lymphatic Drainage (MLD) at least once a day as directed.  3. WRAPS: Compression wraps are to be worn 23 hrs/ 7 days/wk during Intensive Phase of Complete Decongestive Therapy (CDT).Building tolerance may take time and practice, so don't get discouraged. If bandages begin to feel tight during periods of inactivity and/or during the night, try performing your exercises to loosen them.   4. GARMENTS: During Management Phase CDT your compression garments are to be worn during waking hours when active. Do NOT sleep in your garments!!   5. PUT YOUR FEET UP! Elevate your feet and legs and feet to the level of your heart whenever you are sitting down.   6. SKIN: Carefully monitor skin condition and perform impeccable hygiene daily. Bathe skin with mild soap and water and apply low pH lotion (aka Eucerin ) to improve hydration and limit infection risk.    Lymphatic Pumping Exercises:            

## 2019-05-27 NOTE — Therapy (Signed)
[place] MAIN [facility] SERVICES [address] [place], [place], [address] Phone: [phone]   Fax:  [phone]  Occupational Therapy Evaluation  Patient Details  Name: [name] MRN: [id number] Date of Birth: 1956-05-08 Referring Provider (OT): Jeri Cos, MD   Encounter Date: 05/27/2019    Past Medical History:  Diagnosis Date  . Arthritis   . Asthma   . Enlarged heart   . Hypertension   . Lymphedema   . Sleep apnea   . Spinal stenosis     Past Surgical History:  Procedure Laterality Date  . COLONOSCOPY WITH PROPOFOL N/A 03/12/2018   Procedure: COLONOSCOPY WITH PROPOFOL;  Surgeon: Manya Silvas, MD;  Location: [facility] ENDOSCOPY;  Service: Endoscopy;  Laterality: N/A;  . NO PAST SURGERIES      There were no vitals filed for this visit.  Subjective Assessment - 05/27/19 1028    Subjective   [name] is referred to Occupational Therapy by Wynonia Sours, PA-C from the [facility] to evaluate and treat BLE lymphedema.    Pertinent History  severe BLE lymphedema Tarda, stage III (Lymphostatic Elephantiasis), angina, HTN, OSA 9used CPAP most night), SOB w exertion, heart palpitations, Hx recurrent LE cellulitis    Limitations  difficulty walking, impaired gant, impaired transfers, elevated falls and infection risk, impaires BLE strength, decreased endurance, impaired basic ADLs (lb bathingm, LB dressing, fitting clothing and shoes, ambulation, transfers, bed mobility, sleep), impaired instrumental ADLs (housekeeping, home management tasks, yard work, standing to wash dishes and prep food, limted shopping), impaired social participation, unable to work due to limited standing, walking and sitting tolerance, impaired body image and lack of self esteme due to sidfiguring nature of LE    Repetition  Increases Symptoms    Special Tests  strong + Stemmer sign B base of toes    Patient Stated Goals  reduce swelling so IO can  walk more, do more, feel better about how I look. Pt is tearfuil when stating goals.    Currently in Pain?  Yes    Pain Score  1     Pain Location  Leg    Pain Orientation  Right;Left    Pain Descriptors / Indicators  Tiring;Aching;Tender;Shooting;Sore;Stabbing;Sharp;Jabbing;Squeezing;Tightness;Heaviness;Discomfort;Numbness;Burning    Pain Type  Chronic pain    Pain Onset  Other (comment)   ~2003   Pain Frequency  Intermittent    Aggravating Factors   standing, walking, sitting > 10 minutes    Pain Relieving Factors  elevation, compression,    Effect of Pain on Daily Activities  see LIMITATIONS. 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   Yes    Is the patient reluctant to leave their home because of a fear of falling?   No   covid  Home  Environment   Family/patient expects to be discharged to:  Private residence    Living Arrangements  Children    Available Help at Discharge  --   none   Type of [place] Access  Other (Comment)    Home Layout  One level   1 step. 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; previouslytextiles,  [employer]   Leisure  read, computer games, tv, phone        Moderate, stage III, BLE Lymphedema  (lymphostatic elephantiasis) 2/2 suspected primary etiology- Lymphedema Tarda Skin  Description Hyper-Keratosis Peau' de Orange Shiny Tight Fibrotic Fatty Doughy Indurated   x x x x x x  X Fistulas Lymphatic cysts Papillomas Excess fibrotic adipose   Skin dry Flaky Erythema Macerated   Mod x     Color Redness Present Pallor Blanching Hemosiderin Staining Other   x   x     Odor Malodorous Yeast Fungal infection  Absent     suspected    Temperature Warm Cool wnl    x    Pitting Edema   1+ 2+ 3+ 4+ Non-pitting        x   Girth Symmetrical Asymmetrical Other Distribution    x R>L   Stemmer Sign Positive Negative     BLE, strong +    Lymphorrea History Of:  Present Absent   x      Wounds History Of Present Absent Venous Arterial Pressure Size   denies            Signs of Infection Redness Warmth Erythema Acute Swelling Drainage Borders                   Scars   Adhesions Hypersensitivity          Sensation Light Touch Deep pressure Hypersensitivty   Present Impaired Present Impaired Absent Impaired   x  x  x   x  Nails WNL Fungus Other     TBA  x Hair Growth Symmetrical Asymmetrical   deficit    Skin Creases Base of toes  Ankles   Base of Fingers Medial Thighs         Abdominal pannus     x x   Lobules bilaterally  x                   OT Education - 05/27/19 1041    Education Details  Provided Pt and family education regarding lymphatic structure and function, etiologies, onset patterns and stages of progression. Discussed  impact of obesity on lymphatic system function. Outlined Complete Decongestive Therapy (CDT)  as standard of care and provided in depth information regarding 4 primary components of both Intensive and Self Management Phases, including Manual Lymph Drainage (MLD), compression wrapping and garments, skin care, and  therapeutic exercise.   Pilar Plate discussion of high burden of care and in this case, need for consistent , daily caregiver assistance for optimal clinical outcome. Discussed  Importance of daily, ongoing LE self-care  essential to retaining clinical gains and limiting progression.  Lastly, reviewed lymphedema precautions, including cellulitis risk and difficulty with wound healing. Provided printed Lymphedema Workbook for reference.    Person(s) Educated  Patient    Methods  Explanation;Demonstration;Handout    Comprehension  Verbalized understanding;Returned demonstration;Need further instruction          OT Long Term Goals - 05/27/19 1313      OT LONG TERM GOAL #1   Title  Pt will be able to apply BLE, knee length, multi-layer, short stretch compression wraps daily using correct gradient techniques with modified independence (extra time, supported positioning PRN)   to achieve optimal limb volume reduction, to return affected limb , as closely as possible, to premorbid size and shape, to limit infection risk, and to improve safe functional ambulation and mobility.    Baseline  Max A    Time  4    Period  Days    Status  New    Target Date  --   4th OT Rx visit     OT LONG TERM GOAL #2   Title  Pt to achieve at least 10% RLE limb volume reductions below the knees bilaterally  during Intensive Phase CDT to improve independence and safety with functional ambulation and transfers,  of basic and instrumental ADLs, , and  to limit  infection risk and LE progression.    Baseline  dependent    Time  12    Period  Weeks    Status  New    Target Date  08/25/19      OT LONG TERM GOAL #3   Title  Pt and CG  will be able to verbalize signs and symptoms of cellulitis infection and identify 4 common lymphedema precautions using printed resource for reference (modified independence) to limit LE progression over time.    Baseline  Max A    Time  4    Period  Days    Status  New    Target Date  --    4th OT Rx visit     OT LONG TERM GOAL #4   Title  Pt will achieve and sustain no less than  85% compliance with daily LE self-care home program (skin care, lymphatic pumping therex, compression and simple self-MLD) ) with  modified independence ( assistive devices, extra time for LE self care) during Intensive Phase CDT  to limit  limb swelling, reduce infection risk,  limit associated pain , and limit LE progression.    Baseline  Max A    Time  12    Period  Weeks    Status  New    Target Date  08/25/19      OT LONG TERM GOAL #5   Title  Pt will be able to tolerate appropriate compression wraps, garments and/ or devices  for full time daily use and HOS prn within 1 week of  issue date  for optimal LE self-management and  to limit progression over time.    Baseline  Max A    Time  12    Period  Weeks    Status  New    Target Date  08/25/19            Plan - 05/27/19 1644    Occupational performance deficits (Please refer to evaluation for details):  ADL's;Work;Other;IADL's;Rest and Sleep;Leisure;Social Participation   body image, self esteme   Body Structure / Function / Physical Skills  ADL;Decreased knowledge  of precautions;Edema;Flexibility;Obesity;ROM;Skin integrity;Pain;Mobility;Decreased knowledge of use of DME;Endurance;Gait;IADL;Sensation    Rehab Potential  Fair    Clinical Decision Making  Multiple treatment options, significant modification of task necessary    Comorbidities Affecting Occupational Performance:  Presence of comorbidities impacting occupational performance    Comorbidities impacting occupational performance description:  See SUBJECTIVE    Modification or Assistance to Complete Evaluation   Max significant modification of tasks or assist is necessary to complete    OT Frequency  2x / week    OT Duration  12 weeks   Pt's condition is so long standing and severe that typical 12 weeks CDT course is insufficient.Marland Kitchen BLE treatment is estimated to require no  less than 6 months in outpatient stting for optimal clinical outcome.   OT Treatment/Interventions  Self-care/ADL training;Therapeutic exercise;Energy conservation;Functional Mobility Training;Manual lymph drainage;Scar mobilization;Therapeutic activities;Coping strategies training;Patient/family education;Compression bandaging;Manual Therapy;DME and/or AE instruction;Other (comment)   skin care w low ph lotion and /or castor oil   OT Home Exercise Plan  lymphatic pumping ther ex: 3-4 x daily, bilaterally, 10 reps each in order    Recommended Other Services  Ms. Ludwig  will also benefit from a sequential pneumatic compression device (SPCD), or "pump" to assist with long-term LE self-management. The Flexitouch from [employer], an advanced device, is the only sequential device that duplicates proximal to distal lymphatic decongestion to return protein-rich fluid through inguinal lymph nodes and deep abdominal lymphatics to the heart and blood circulation. A basic pneumatic device is not appropriate for this patient because  applies compression from distal to proximal against back pressure in the thigh and abdomen, They do not facilitate lymphatic return via deep abdominal pathways and the thoracic duct to return it to the heart and finally  blood recirculation.    Consulted and Agree with Plan of Care  Patient       Patient will benefit from skilled therapeutic intervention in order to improve the following deficits and impairments:   Body Structure / Function / Physical Skills: ADL, Decreased knowledge of precautions, Edema, Flexibility, Obesity, ROM, Skin integrity, Pain, Mobility, Decreased knowledge of use of DME, Endurance, Gait, IADL, Sensation       Visit Diagnosis: Lymphedema, not elsewhere classified - Plan: Ot plan of care cert/re-cert    Problem List Patient Active Problem List   Diagnosis Date Noted  . Cellulitis of left lower extremity 11/03/2018  . Sepsis ([place]) 04/17/2018   . Chest pain with high risk for cardiac etiology 02/04/2017  . Heart palpitations 02/04/2017  . SOB (shortness of breath) on exertion 02/04/2017  . OSA (obstructive sleep apnea) 08/04/2016  . HTN (hypertension) 08/04/2016  . Asthma 08/04/2016  . Screening for colon cancer 05/17/2015  . Cellulitis 01/24/2015  . Cellulitis of right leg 01/23/2015  . Skin rash 12/28/2013  . Tobacco use 12/28/2013  . Spinal stenosis 07/11/2011  . Cataracts, bilateral 05/30/2010  . History of colonic polyps 12/28/2009  . Lymphedema 02/10/2009  . Idiopathic urticaria 02/10/2009  . Morbid obesity ([place]) 02/10/2009  . Other abnormal glucose 02/10/2009  . Essential (primary) hypertension 02/10/2009    Andrey Spearman, MS, OTR/L, [facility] 05/27/19 5:02 PM  [place] MAIN [facility] SERVICES [address] [place], [place], [address] Phone: [phone]   Fax:  [phone]  Name: [name] MRN: [id number] Date of Birth: 1955/11/23

## 2019-06-01 ENCOUNTER — Encounter: Payer: Medicaid Other | Admitting: Physician Assistant

## 2019-06-01 ENCOUNTER — Other Ambulatory Visit: Payer: Self-pay

## 2019-06-01 ENCOUNTER — Inpatient Hospital Stay
Admission: EM | Admit: 2019-06-01 | Discharge: 2019-06-07 | DRG: 603 | Disposition: A | Discharge status: Home / Self Care | Payer: Medicaid Other | Attending: Internal Medicine | Admitting: Internal Medicine

## 2019-06-01 ENCOUNTER — Emergency Department: Payer: Medicaid Other

## 2019-06-01 DIAGNOSIS — M79604 Pain in right leg: Secondary | ICD-10-CM | POA: Diagnosis present

## 2019-06-01 DIAGNOSIS — D649 Anemia, unspecified: Secondary | ICD-10-CM | POA: Diagnosis present

## 2019-06-01 DIAGNOSIS — I878 Other specified disorders of veins: Secondary | ICD-10-CM | POA: Diagnosis present

## 2019-06-01 DIAGNOSIS — M1990 Unspecified osteoarthritis, unspecified site: Secondary | ICD-10-CM | POA: Diagnosis present

## 2019-06-01 DIAGNOSIS — J45909 Unspecified asthma, uncomplicated: Secondary | ICD-10-CM | POA: Diagnosis present

## 2019-06-01 DIAGNOSIS — I10 Essential (primary) hypertension: Secondary | ICD-10-CM | POA: Diagnosis present

## 2019-06-01 DIAGNOSIS — M7989 Other specified soft tissue disorders: Secondary | ICD-10-CM

## 2019-06-01 DIAGNOSIS — F17220 Nicotine dependence, chewing tobacco, uncomplicated: Secondary | ICD-10-CM | POA: Diagnosis not present

## 2019-06-01 DIAGNOSIS — Z20828 Contact with and (suspected) exposure to other viral communicable diseases: Secondary | ICD-10-CM | POA: Diagnosis present

## 2019-06-01 DIAGNOSIS — L03115 Cellulitis of right lower limb: Secondary | ICD-10-CM | POA: Diagnosis present

## 2019-06-01 DIAGNOSIS — Z79899 Other long term (current) drug therapy: Secondary | ICD-10-CM | POA: Diagnosis not present

## 2019-06-01 DIAGNOSIS — I890 Lymphedema, not elsewhere classified: Secondary | ICD-10-CM | POA: Diagnosis present

## 2019-06-01 DIAGNOSIS — Z9989 Dependence on other enabling machines and devices: Secondary | ICD-10-CM | POA: Diagnosis not present

## 2019-06-01 DIAGNOSIS — F17290 Nicotine dependence, other tobacco product, uncomplicated: Secondary | ICD-10-CM | POA: Diagnosis present

## 2019-06-01 DIAGNOSIS — Z6841 Body Mass Index (BMI) 40.0 and over, adult: Secondary | ICD-10-CM | POA: Diagnosis not present

## 2019-06-01 DIAGNOSIS — G4733 Obstructive sleep apnea (adult) (pediatric): Secondary | ICD-10-CM | POA: Diagnosis present

## 2019-06-01 DIAGNOSIS — Z886 Allergy status to analgesic agent status: Secondary | ICD-10-CM

## 2019-06-01 DIAGNOSIS — Z888 Allergy status to other drugs, medicaments and biological substances status: Secondary | ICD-10-CM

## 2019-06-01 DIAGNOSIS — D638 Anemia in other chronic diseases classified elsewhere: Secondary | ICD-10-CM

## 2019-06-01 DIAGNOSIS — M79661 Pain in right lower leg: Secondary | ICD-10-CM

## 2019-06-01 LAB — BASIC METABOLIC PANEL
Anion gap: 12 (ref 5–15)
BUN: 17 mg/dL (ref 8–23)
CO2: 27 mmol/L (ref 22–32)
Calcium: 9 mg/dL (ref 8.9–10.3)
Chloride: 102 mmol/L (ref 98–111)
Creatinine, Ser: 0.95 mg/dL (ref 0.44–1.00)
GFR calc Af Amer: >60 mL/min (ref >60)
GFR calc non Af Amer: >60 mL/min (ref >60)
Glucose, Bld: 79 mg/dL (ref 70–99)
Potassium: 3.9 mmol/L (ref 3.5–5.1)
Sodium: 141 mmol/L (ref 135–145)

## 2019-06-01 LAB — CBC
HCT: 36.4 % (ref 36.0–46.0)
Hemoglobin: 11.4 g/dL — ABNORMAL LOW (ref 12.0–15.0)
MCH: 25.7 pg — ABNORMAL LOW (ref 26.0–34.0)
MCHC: 31.3 g/dL (ref 30.0–36.0)
MCV: 82.2 fL (ref 80.0–100.0)
Platelets: 172 10*3/uL (ref 150–400)
RBC: 4.43 MIL/uL (ref 3.87–5.11)
RDW: 14.6 % (ref 11.5–15.5)
WBC: 5.8 10*3/uL (ref 4.0–10.5)
nRBC: 0 % (ref 0.0–0.2)

## 2019-06-01 LAB — SEDIMENTATION RATE: Sed Rate: 58 mm/hr — ABNORMAL HIGH (ref 0–30)

## 2019-06-01 MED ORDER — VANCOMYCIN HCL 10 G IV SOLR: 1750.0000 mg | INTRAVENOUS | Status: DC

## 2019-06-01 MED ORDER — SODIUM CHLORIDE 0.9 % IV SOLN
2.0000 g | Freq: Once | INTRAVENOUS | Status: AC
  Administered 2019-06-01: 2 g via INTRAVENOUS
  Filled 2019-06-01: qty 2

## 2019-06-01 MED ORDER — VANCOMYCIN HCL 10 G IV SOLR
1750.0000 mg | INTRAVENOUS | Status: DC
  Filled 2019-06-01: qty 1750

## 2019-06-01 MED ORDER — VANCOMYCIN HCL 10 G IV SOLR
2500.0000 mg | Freq: Once | INTRAVENOUS | Status: AC
  Administered 2019-06-02: 2500 mg via INTRAVENOUS
  Filled 2019-06-01: qty 2500

## 2019-06-01 MED ORDER — ENOXAPARIN SODIUM 40 MG/0.4ML ~~LOC~~ SOLN
40.0000 mg | Freq: Two times a day (BID) | SUBCUTANEOUS | Status: DC
  Administered 2019-06-02 – 2019-06-07 (×12): 40 mg via SUBCUTANEOUS
  Filled 2019-06-01 (×12): qty 0.4

## 2019-06-01 MED ORDER — ACETAMINOPHEN 325 MG PO TABS
325.0000 mg | ORAL_TABLET | Freq: Four times a day (QID) | ORAL | Status: DC | PRN
  Administered 2019-06-02 – 2019-06-05 (×2): 650 mg via ORAL
  Filled 2019-06-01 (×2): qty 2

## 2019-06-01 MED ORDER — OXYCODONE-ACETAMINOPHEN 5-325 MG PO TABS
1.0000 | ORAL_TABLET | Freq: Once | ORAL | Status: AC
  Administered 2019-06-01: 1 via ORAL
  Filled 2019-06-01: qty 1

## 2019-06-01 MED ORDER — POLYETHYLENE GLYCOL 3350 17 G PO PACK: 17.0000 g | PACK | Freq: Every day | ORAL | Status: DC | PRN

## 2019-06-01 MED ORDER — ATENOLOL 25 MG PO TABS
50.0000 mg | ORAL_TABLET | Freq: Every day | ORAL | Status: DC
  Administered 2019-06-02 – 2019-06-04 (×3): 50 mg via ORAL
  Filled 2019-06-01 (×3): qty 2

## 2019-06-01 MED ORDER — SODIUM CHLORIDE 0.9 % IV SOLN
2.0000 g | Freq: Three times a day (TID) | INTRAVENOUS | Status: DC
  Administered 2019-06-02: 2 g via INTRAVENOUS
  Filled 2019-06-01 (×3): qty 2

## 2019-06-01 NOTE — ED Notes (Signed)
Patient transported to ultrasound.

## 2019-06-01 NOTE — ED Provider Notes (Signed)
[facility] EMERGENCY DEPARTMENT Provider Note   CSN: [id number] Arrival date & time: 06/01/19  1534     History   Chief Complaint Chief Complaint  Patient presents with   Leg Swelling    HPI [name] is a 63 y.o. female presents to the emergency department for evaluation of acute on chronic right lower extremity swelling.  Patient has a history of chronic lymphedema with some venous stasis changes in the right leg.  Has a history of admission 1 month ago for left lower extremity cellulitis, cultures positive for Pseudomonas and Klebsiella, responded well to cefepime and discharged home with [facility].  Patient states her left lower extremity cellulitis has resolved but she is here today for increased tightness, swelling and pain in the right leg.  Over the last week she is noticed some oozing and drainage along the chronic venous stasis changes of the right calf, today noticed swelling and increase with pain to touch along the distal medial thigh.  No trauma or injury.  No fevers, chills.  No chest pain or shortness of breath.  No history of blood clots.  She has been taking Cipro over the last week with no improvement.  Pain is 7 out of 10 to touch.  Pain is mild at rest.  No numbness or tingling in the right leg.  Pain is limiting her ability to ambulate.  She does not feel that she could go home safely in this much pain due to increased swelling.     HPI  Past Medical History:  Diagnosis Date   Arthritis    Asthma    Enlarged heart    Hypertension    Lymphedema    Sleep apnea    Spinal stenosis     Patient Active Problem List   Diagnosis Date Noted   Cellulitis of left lower extremity 11/03/2018   Sepsis ([place]) 04/17/2018   Chest pain with high risk for cardiac etiology 02/04/2017   Heart palpitations 02/04/2017   SOB (shortness of breath) on exertion 02/04/2017   OSA (obstructive sleep apnea) 08/04/2016   HTN (hypertension)  08/04/2016   Asthma 08/04/2016   Screening for colon cancer 05/17/2015   Cellulitis 01/24/2015   Cellulitis of right leg 01/23/2015   Skin rash 12/28/2013   Tobacco use 12/28/2013   Spinal stenosis 07/11/2011   Cataracts, bilateral 05/30/2010   History of colonic polyps 12/28/2009   Lymphedema 02/10/2009   Idiopathic urticaria 02/10/2009   Morbid obesity ([place]) 02/10/2009   Other abnormal glucose 02/10/2009   Essential (primary) hypertension 02/10/2009    Past Surgical History:  Procedure Laterality Date   COLONOSCOPY WITH PROPOFOL N/A 03/12/2018   Procedure: COLONOSCOPY WITH PROPOFOL;  Surgeon: Manya Silvas, MD;  Location: [facility] ENDOSCOPY;  Service: Endoscopy;  Laterality: N/A;   NO PAST SURGERIES       OB History   No obstetric history on file.      Home Medications    Prior to Admission medications   Medication Sig Start Date End Date Taking? Authorizing Provider  acetaminophen (TYLENOL) 650 MG CR tablet Take 650-1,300 mg by mouth every 8 (eight) hours as needed for pain.    [provider]  albuterol (PROVENTIL HFA;VENTOLIN HFA) 108 (90 BASE) MCG/ACT inhaler Inhale 2 puffs into the lungs every 6 (six) hours as needed for wheezing or shortness of breath.    [provider]  atenolol (TENORMIN) 50 MG tablet Take 50 mg by mouth daily.  [provider]  cefdinir (OMNICEF) 300 MG capsule Take 1 capsule (300 mg total) by mouth every 12 (twelve) hours. 04/14/19   Nicholes Mango, MD  cetirizine (ZYRTEC) 10 MG tablet Take 10 mg by mouth daily as needed for allergies.    [provider]  polyethylene glycol (MIRALAX / GLYCOLAX) 17 g packet Take 17 g by mouth daily as needed for mild constipation. 04/14/19   Nicholes Mango, MD    Family History Family History  Problem Relation Age of Onset   Thyroid disease Other    Breast cancer Maternal Aunt     Social History Social History   Tobacco Use   Smoking status: Former  Smoker   Smokeless tobacco: Current User    Types: Snuff  Substance Use Topics   Alcohol use: No   Drug use: No     Allergies   Ace inhibitors and Ibuprofen   Review of Systems Review of Systems  Constitutional: Negative for chills and fever.  Cardiovascular: Positive for leg swelling. Negative for chest pain.  Musculoskeletal: Positive for myalgias. Negative for gait problem.  Skin: Negative for color change, rash and wound.  Neurological: Negative for numbness.     Physical Exam Updated Vital Signs BP (!) 141/55 (BP Location: Right Arm)    Pulse 80    Temp 98.2 F (36.8 C) (Oral)    Resp 20    Ht 5' 3.5" (1.613 m)    Wt (!) 149.7 kg    SpO2 100%    BMI 57.54 kg/m   Physical Exam Constitutional:      Appearance: She is well-developed. She is obese.     Comments: Severe lymphedema present bilateral lower extremities.  HENT:     Head: Normocephalic and atraumatic.  Eyes:     Conjunctiva/sclera: Conjunctivae normal.  Neck:     Musculoskeletal: Normal range of motion.  Cardiovascular:     Rate and Rhythm: Normal rate.  Pulmonary:     Effort: Pulmonary effort is normal. No respiratory distress.  Musculoskeletal:     Comments: Bilateral lower extremity shows severe chronic lymphedema with venous stasis changes in the lower part of the right calf.  Slight erythema, induration with oozing of the skin along the calf.  Erythema appears to extend further up the mid anterior medial thigh on the right leg more so than on the left.  Slight warmth noted to the right distal medial anterior thigh.  No tenderness throughout the calf bilaterally.  Negative logroll test.  2+ dorsalis pedis pulses with normal ankle plantar flexion dorsiflexion.  Soft tissue compartments are soft  Skin:    General: Skin is warm.     Findings: No rash.  Neurological:     Mental Status: She is alert and oriented to person, place, and time.  Psychiatric:        Behavior: Behavior normal.        Thought  Content: Thought content normal.      ED Treatments / Results  Labs (all labs ordered are listed, but only abnormal results are displayed) Labs Reviewed  CBC - Abnormal; Notable for the following components:      Result Value   Hemoglobin 11.4 (*)    MCH 25.7 (*)    All other components within normal limits  SEDIMENTATION RATE - Abnormal; Notable for the following components:   Sed Rate 58 (*)    All other components within normal limits  BASIC METABOLIC PANEL    EKG None  Radiology US Venous Img Lower Unilateral Right  Result Date: 06/01/2019 CLINICAL DATA:  Acute pain and swelling x2 weeks EXAM: RIGHT LOWER EXTREMITY VENOUS DOPPLER ULTRASOUND TECHNIQUE: Gray-scale sonography with compression, as well as color and duplex ultrasound, were performed to evaluate the deep venous system from the level of the common femoral vein through the popliteal and proximal calf veins. Technologist describes technically difficult study secondary to body habitus and edema. COMPARISON:  04/10/2019 FINDINGS: Normal compressibility of the common femoral, superficial femoral, and popliteal veins, as well as the proximal calf veins. No filling defects to suggest DVT on grayscale or color Doppler imaging. Doppler waveforms show normal direction of venous flow, normal respiratory phasicity and response to augmentation. Subcutaneous calf edema. Survey views of the contralateral common femoral vein are unremarkable. IMPRESSION: No femoropopliteal and no calf DVT in the visualized calf veins. If clinical symptoms are inconsistent or if there are persistent or worsening symptoms, further imaging (possibly involving the iliac veins) may be warranted. Electronically Signed   By: Lucrezia Europe M.D.   On: 06/01/2019 17:02   Dg Knee Complete 4 Views Right  Result Date: 06/01/2019 CLINICAL DATA:  Right knee pain EXAM: RIGHT KNEE - COMPLETE 4+ VIEW COMPARISON:  None. FINDINGS: Bone detail limited due to morbid obesity.  Negative for fracture. Severe degenerative change in the lateral compartment. Moderate degenerative change in spurring medially. Patellofemoral degenerative change. IMPRESSION: Negative for fracture.  Exam limited by obesity. Electronically Signed   By: Franchot Gallo M.D.   On: 06/01/2019 18:41    Procedures Procedures (including critical care time)  Medications Ordered in ED Medications  oxyCODONE-acetaminophen (PERCOCET/ROXICET) 5-325 MG per tablet 1 tablet (1 tablet Oral Given 06/01/19 1854)     Initial Impression / Assessment and Plan / ED Course  I have reviewed the triage vital signs and the nursing notes.  Pertinent labs & imaging results that were available during my care of the patient were reviewed by me and considered in my medical decision making (see chart for details).        62 year old female with chronic lymphedema.  History of recent cellulitis in the left leg little over 1 month ago.  She has had recurring symptoms over the last week in the right leg with significant increasing pain today.  Right leg appears to be slightly more swollen, erythematous and warm.  Patient's pain is increased and she does not feel as if she is able to get around safely due to the increased pain and swelling.  History exam consistent with cellulitis.  She has been taking oral antibiotics at home with no improvement.  Ultrasound of the right lower extremity shows no DVT.  X-ray showed no significant effusion, acute bony abnormality or abnormal bony lesion.  Will discuss admission with hospitalist.  Final Clinical Impressions(s) / ED Diagnoses   Final diagnoses:  Lymphedema  Cellulitis of right lower extremity  Pain and swelling of right lower leg    ED Discharge Orders    None       Renata Caprice 06/01/19 2002    Vanessa Chireno, MD 06/01/19 2010

## 2019-06-01 NOTE — ED Triage Notes (Signed)
Pt was sent from the wound care center with concerns for a DVT of the RLE. Pt has chronic BL LE lymphdema but the RLE has increased swelling and pain from mid calf to mid thigh today.

## 2019-06-01 NOTE — ED Notes (Signed)
Lab called to assist with specimen collection.

## 2019-06-01 NOTE — Progress Notes (Addendum)
[name], [name] ([id number]) Visit Report for 06/01/2019 Arrival Information Details Patient Name: [name], [name]. Date of Service: 06/01/2019 2:30 PM Medical Record Number: [id number] Patient Account Number: [id number] Date of Birth/Sex: Sep 02, 1955 (63 y.o. F) Treating RN: Montey Hora Primary Care Gagandeep Kossman: Tandy Gaw Other Clinician: Referring Solene Hereford: Tandy Gaw Treating Macy Polio/Extender: STONE III, HOYT Weeks in Treatment: 14 Visit Information History Since Last Visit Added or deleted any medications: No Patient Arrived: Cane Any new allergies or adverse reactions: No Arrival Time: 14:27 Had a fall or experienced change in No Accompanied By: self activities of daily living that may affect Transfer Assistance: None risk of falls: Patient Identification Verified: Yes Signs or symptoms of abuse/neglect since last visito No Secondary Verification Process Completed: Yes Hospitalized since last visit: No Patient Requires Transmission-Based Precautions: No Implantable device outside of the clinic excluding No Patient Has Alerts: No cellular tissue based products placed in the center since last visit: Has Dressing in Place as Prescribed: Yes Has Compression in Place as Prescribed: Yes Pain Present Now: No Electronic Signature(s) Signed: 06/01/2019 5:10:29 PM By: Montey Hora Entered By: Montey Hora on 06/01/2019 14:27:56 [name], [name] ([id number]) -------------------------------------------------------------------------------- Clinic Level of Care Assessment Details Patient Name: [name]. Date of Service: 06/01/2019 2:30 PM Medical Record Number: [id number] Patient Account Number: [id number] Date of Birth/Sex: 1956/04/22 (63 y.o. F) Treating RN: Harold Barban Primary Care Kito Cuffe: Tandy Gaw Other Clinician: Referring Adriell Polansky: Tandy Gaw Treating Deyonte Cadden/Extender: STONE III, HOYT Weeks in Treatment: 14 Clinic Level of Care Assessment  Items TOOL 4 Quantity Score []  - Use when only an EandM is performed on FOLLOW-UP visit 0 ASSESSMENTS - Nursing Assessment / Reassessment X - Reassessment of Co-morbidities (includes updates in patient status) 1 10 X- 1 5 Reassessment of Adherence to Treatment Plan ASSESSMENTS - Wound and Skin Assessment / Reassessment X - Simple Wound Assessment / Reassessment - one wound 1 5 []  - 0 Complex Wound Assessment / Reassessment - multiple wounds []  - 0 Dermatologic / Skin Assessment (not related to wound area) ASSESSMENTS - Focused Assessment []  - Circumferential Edema Measurements - multi extremities 0 []  - 0 Nutritional Assessment / Counseling / Intervention []  - 0 Lower Extremity Assessment (monofilament, tuning fork, pulses) []  - 0 Peripheral Arterial Disease Assessment (using hand held doppler) ASSESSMENTS - Ostomy and/or Continence Assessment and Care []  - Incontinence Assessment and Management 0 []  - 0 Ostomy Care Assessment and Management (repouching, etc.) PROCESS - Coordination of Care X - Simple Patient / Family Education for ongoing care 1 15 []  - 0 Complex (extensive) Patient / Family Education for ongoing care []  - 0 Staff obtains Programmer, systems, Records, Test Results / Process Orders []  - 0 Staff telephones HHA, Nursing Homes / Clarify orders / etc []  - 0 Routine Transfer to another Facility (non-emergent condition) []  - 0 Routine Hospital Admission (non-emergent condition) []  - 0 New Admissions / Biomedical engineer / Ordering NPWT, Apligraf, etc. X- 1 20 Emergency Hospital Admission (emergent condition) []  - 0 Simple Discharge Coordination [name], [name] ([id number]) []  - 0 Complex (extensive) Discharge Coordination PROCESS - Special Needs []  - Pediatric / Minor Patient Management 0 []  - 0 Isolation Patient Management []  - 0 Hearing / Language / Visual special needs []  - 0 Assessment of Community assistance (transportation, D/C planning, etc.) []  -  0 Additional assistance / Altered mentation []  - 0 Support Surface(s) Assessment (bed, cushion, seat, etc.) INTERVENTIONS - Wound Cleansing / Measurement X - Simple Wound Cleansing - one wound  1 5 []  - 0 Complex Wound Cleansing - multiple wounds X- 1 5 Wound Imaging (photographs - any number of wounds) []  - 0 Wound Tracing (instead of photographs) X- 1 5 Simple Wound Measurement - one wound []  - 0 Complex Wound Measurement - multiple wounds INTERVENTIONS - Wound Dressings X - Small Wound Dressing one or multiple wounds 1 10 []  - 0 Medium Wound Dressing one or multiple wounds []  - 0 Large Wound Dressing one or multiple wounds []  - 0 Application of Medications - topical []  - 0 Application of Medications - injection INTERVENTIONS - Miscellaneous []  - External ear exam 0 []  - 0 Specimen Collection (cultures, biopsies, blood, body fluids, etc.) []  - 0 Specimen(s) / Culture(s) sent or taken to Lab for analysis []  - 0 Patient Transfer (multiple staff / Civil Service fast streamer / Similar devices) []  - 0 Simple Staple / Suture removal (25 or less) []  - 0 Complex Staple / Suture removal (26 or more) []  - 0 Hypo / Hyperglycemic Management (close monitor of Blood Glucose) []  - 0 Ankle / Brachial Index (ABI) - do not check if billed separately X- 1 5 Vital Signs [name], [name] ([id number]) Has the patient been seen at the hospital within the last three years: Yes Total Score: 85 Level Of Care: New/Established - Level 3 Electronic Signature(s) Signed: 06/01/2019 5:37:56 PM By: Harold Barban Entered By: Harold Barban on 06/01/2019 14:59:15 [name], [name] ([id number]) -------------------------------------------------------------------------------- Encounter Discharge Information Details Patient Name: [name]. Date of Service: 06/01/2019 2:30 PM Medical Record Number: [id number] Patient Account Number: [id number] Date of Birth/Sex: 10-01-1955 (63 y.o. F) Treating RN: Harold Barban Primary Care Mariah Gerstenberger: Tandy Gaw Other Clinician: Referring Allen Egerton: Tandy Gaw Treating Jaiyah Beining/Extender: Melburn Hake, HOYT Weeks in Treatment: 14 Encounter Discharge Information Items Discharge Condition: Stable Ambulatory Status: Ambulatory Discharge Destination: Home Transportation: Private Auto Accompanied By: self Schedule Follow-up Appointment: Yes Clinical Summary of Care: Electronic Signature(s) Signed: 06/01/2019 5:37:56 PM By: Harold Barban Entered By: Harold Barban on 06/01/2019 15:05:01 [name], [name] ([id number]) -------------------------------------------------------------------------------- Lower Extremity Assessment Details Patient Name: Elizabeth Landau C. Date of Service: 06/01/2019 2:30 PM Medical Record Number: [id number] Patient Account Number: [id number] Date of Birth/Sex: [birth year] (63 y.o. F) Treating RN: Montey Hora Primary Care Stanlee Roehrig: Tandy Gaw Other Clinician: Referring Timothey Dahlstrom: Tandy Gaw Treating Nomi Rudnicki/Extender: STONE III, HOYT Weeks in Treatment: 14 Edema Assessment Assessed: [Left: No] [Right: No] Edema: [Left: Yes] [Right: Yes] Vascular Assessment Pulses: Dorsalis Pedis Palpable: [Left:Yes] [Right:Yes] Electronic Signature(s) Signed: 06/01/2019 5:10:29 PM By: Montey Hora Entered By: Montey Hora on 06/01/2019 14:32:05 [name], [name] ([id number]) -------------------------------------------------------------------------------- Multi Wound Chart Details Patient Name: Elizabeth Landau C. Date of Service: 06/01/2019 2:30 PM Medical Record Number: [id number] Patient Account Number: [id number] Date of Birth/Sex: 1956/06/13 (63 y.o. F) Treating RN: Harold Barban Primary Care Rayshard Schirtzinger: Tandy Gaw Other Clinician: Referring Mayre Bury: Tandy Gaw Treating Terika Pillard/Extender: STONE III, HOYT Weeks in Treatment: 14 Vital Signs Height(in): 62 Pulse(bpm): 87 Weight(lbs): 390 Blood Pressure(mmHg):  168/83 Body Mass Index(BMI): 71 Temperature(F): 98.4 Respiratory Rate 22 (breaths/min): Wound Assessments Treatment Notes Electronic Signature(s) Signed: 06/01/2019 5:37:56 PM By: Harold Barban Entered By: Harold Barban on 06/01/2019 14:57:43 [name], [name] ([id number]) -------------------------------------------------------------------------------- Multi-Disciplinary Care Plan Details Patient Name: [name], BOEHNLEIN. Date of Service: 06/01/2019 2:30 PM Medical Record Number: [id number] Patient Account Number: [id number] Date of Birth/Sex: [birth year] (63 y.o. F) Treating RN: Harold Barban Primary Care Mandalyn Pasqua: Tandy Gaw Other Clinician: Referring Admire Bunnell: Tandy Gaw Treating Maleiyah Releford/Extender: STONE III, HOYT Weeks in Treatment:  14 Active Inactive Abuse / Safety / Falls / Self Care Management Nursing Diagnoses: Potential for falls Goals: Patient will remain injury free related to falls Date Initiated: 02/20/2019 Target Resolution Date: 05/16/2019 Goal Status: Active Interventions: Assess fall risk on admission and as needed Notes: Orientation to the Wound Care Program Nursing Diagnoses: Knowledge deficit related to the wound healing center program Goals: Patient/caregiver will verbalize understanding of the [place] Date Initiated: 02/20/2019 Target Resolution Date: 05/16/2019 Goal Status: Active Interventions: Provide education on orientation to the wound center Notes: Venous Leg Ulcer Nursing Diagnoses: Actual venous Insuffiency (use after diagnosis is confirmed) Goals: Patient will maintain optimal edema control Date Initiated: 02/20/2019 Target Resolution Date: 05/16/2019 Goal Status: Active Interventions: Assess peripheral edema status every visit. LETTI, BALLOG ([id number]) Compression as ordered Notes: Wound/Skin Impairment Nursing Diagnoses: Impaired tissue integrity Goals: Ulcer/skin breakdown will heal within 14  weeks Date Initiated: 02/20/2019 Target Resolution Date: 05/16/2019 Goal Status: Active Interventions: Assess patient/caregiver ability to obtain necessary supplies Assess patient/caregiver ability to perform ulcer/skin care regimen upon admission and as needed Assess ulceration(s) every visit Notes: Electronic Signature(s) Signed: 06/01/2019 5:37:56 PM By: Harold Barban Entered By: Harold Barban on 06/01/2019 14:57:34 Stahle, [name] ([id number]) -------------------------------------------------------------------------------- Non-Wound Condition Assessment Details Patient Name: [name]. Date of Service: 06/01/2019 2:30 PM Medical Record Number: [id number] Patient Account Number: [id number] Date of Birth/Sex: 09/05/1955 (63 y.o. F) Treating RN: Montey Hora Primary Care Shilo Pauwels: Tandy Gaw Other Clinician: Referring Tavaughn Silguero: Tandy Gaw Treating Shrika Milos/Extender: STONE III, HOYT Weeks in Treatment: 14 Non-Wound Condition: Condition: Lymphedema Location: Leg Side: Right Photos Electronic Signature(s) Signed: 06/01/2019 5:10:29 PM By: Montey Hora Entered By: Montey Hora on 06/01/2019 14:33:22 [place], [name] ([id number]) -------------------------------------------------------------------------------- Pain Assessment Details Patient Name: Elizabeth Landau C. Date of Service: 06/01/2019 2:30 PM Medical Record Number: [id number] Patient Account Number: [id number] Date of Birth/Sex: [birth year] (63 y.o. F) Treating RN: Montey Hora Primary Care Gita Dilger: Tandy Gaw Other Clinician: Referring Ziya Coonrod: Tandy Gaw Treating Zachory Mangual/Extender: STONE III, HOYT Weeks in Treatment: 14 Active Problems Location of Pain Severity and Description of Pain Patient Has Paino No Site Locations Pain Management and Medication Current Pain Management: Electronic Signature(s) Signed: 06/01/2019 5:10:29 PM By: Montey Hora Entered By: Montey Hora on  06/01/2019 14:28:02 [name] ([id number]) -------------------------------------------------------------------------------- Patient/Caregiver Education Details Patient Name: [name]. Date of Service: 06/01/2019 2:30 PM Medical Record Number: [id number] Patient Account Number: [id number] Date of Birth/Gender: 1956-06-03 (63 y.o. F) Treating RN: Harold Barban Primary Care Physician: Tandy Gaw Other Clinician: Referring Physician: Tandy Gaw Treating Physician/Extender: Melburn Hake, HOYT Weeks in Treatment: 14 Education Assessment Education Provided To: Patient Education Topics Provided Wound/Skin Impairment: Handouts: Caring for Your Ulcer Methods: Demonstration, Explain/Verbal Responses: State content correctly Electronic Signature(s) Signed: 06/01/2019 5:37:56 PM By: Harold Barban Entered By: Harold Barban on 06/01/2019 14:58:09 [place], [name] ([id number]) -------------------------------------------------------------------------------- [place] Details Patient Name: [name]. Date of Service: 06/01/2019 2:30 PM Medical Record Number: [id number] Patient Account Number: [id number] Date of Birth/Sex: 1956/04/27 (63 y.o. F) Treating RN: Montey Hora Primary Care Navarro Nine: Tandy Gaw Other Clinician: Referring Jocee Kissick: Tandy Gaw Treating [name] Oleson/Extender: STONE III, HOYT Weeks in Treatment: 14 Vital Signs Time Taken: 14:33 Temperature (F): 98.4 Height (in): 62 Pulse (bpm): 87 Weight (lbs): 390 Respiratory Rate (breaths/min): 22 Body Mass Index (BMI): 71.3 Blood Pressure (mmHg): 168/83 Reference Range: 80 - 120 mg / dl Electronic Signature(s) Signed: 06/01/2019 5:10:29 PM By: Montey Hora Entered By: Montey Hora on 06/01/2019 14:36:12

## 2019-06-01 NOTE — H&P (Signed)
History and Physical    [name] [id number] DOB: [birth year] DOA: 06/01/2019  PCP: Ranae Plumber, [place]  Patient coming from: outpatient wound clinic, lives at home with son  I have personally briefly reviewed patient's old medical records in [place]  Chief Complaint:  cellulitis of the right lower extremity  HPI: [name] is a 63 y.o. female with medical history significant of hypertension, asthma, OSA on CPAP, chronic lymphedema ,and morbid obesity who presents with concerns of cellulitis of the right lower extremity.  Patient has significant chronic lymphedema that she follows with outpatient wound clinic.  For the past few weeks on and off there has been increase weeping of fluid in the right lower extremity. Then in the past few days she has noticed increased right lower extremity pain and erythema.  She was placed on ciprofloxacin last week by the wound clinic.  She was advised by her wound clinic physician to present to the ED for further evaluation given worsening erythema.  She denies any fevers.  No nausea vomiting or diarrhea.  She was afebrile and hypertensive up to 160s over 110 on room air.  CBC shows no leukocytosis and hemoglobin of 11.4 which is around her baseline.  BMP otherwise unremarkable. Right knee x-ray was negative for fracture.  Negative right lower extremity venous Doppler ultrasound.  Patient was recently hospitalized back in October for left lower extremity cellulitis and had positive cultures for Pseudomonas and Klebsiella and did well on cefepime based on wound cultures. Cefepime was started in the ER by ED physician.   Pt chews tobacco. Denies alcohol or illicit drug use.  Family hx includes brother, maternal uncle and cousin who has chronic lymphedema.   Review of Systems:  Constitutional: No Weight Change, No Fever ENT/Mouth: No sore throat, No Rhinorrhea Eyes: No Eye Pain, No Vision Changes Cardiovascular: No Chest Pain,  no SOB Respiratory: No Cough, No Sputum, No Wheezing, no Dyspnea  Gastrointestinal: No Nausea, No Vomiting, No Diarrhea, No Constipation, No Pain Genitourinary: no Urinary Incontinence, No Urgency, No Flank Pain Musculoskeletal: No Arthralgias, + Myalgias Skin: No Skin Lesions, No Pruritus, Neuro: no Weakness, No Numbness,  No Loss of Consciousness, No Syncope Psych: No Anxiety/Panic, No Depression, no decrease appetite Heme/Lymph: No Bruising, No Bleeding  Past Medical History:  Diagnosis Date  . Arthritis   . Asthma   . Enlarged heart   . Hypertension   . Lymphedema   . Sleep apnea   . Spinal stenosis     Past Surgical History:  Procedure Laterality Date  . COLONOSCOPY WITH PROPOFOL N/A 03/12/2018   Procedure: COLONOSCOPY WITH PROPOFOL;  Surgeon: Manya Silvas, MD;  Location: [facility] ENDOSCOPY;  Service: Endoscopy;  Laterality: N/A;  . NO PAST SURGERIES       reports that she has quit smoking. Her smokeless tobacco use includes snuff. She reports that she does not drink alcohol or use drugs.  Allergies  Allergen Reactions  . Ace Inhibitors Hives and Swelling  . Ibuprofen Hives and Swelling    Family History  Problem Relation Age of Onset  . Thyroid disease Other   . Breast cancer Maternal Aunt      Prior to Admission medications   Medication Sig Start Date End Date Taking? Authorizing Provider  atenolol (TENORMIN) 50 MG tablet Take 50 mg by mouth daily.   Yes [provider]  cetirizine (ZYRTEC) 10 MG tablet Take 10 mg by mouth daily as needed for allergies.  Yes [provider]  acetaminophen (TYLENOL) 650 MG CR tablet Take 650-1,300 mg by mouth every 8 (eight) hours as needed for pain.    [provider]  albuterol (PROVENTIL HFA;VENTOLIN HFA) 108 (90 BASE) MCG/ACT inhaler Inhale 2 puffs into the lungs every 6 (six) hours as needed for wheezing or shortness of breath.    [provider]  cefdinir (OMNICEF) 300 MG capsule Take 1  capsule (300 mg total) by mouth every 12 (twelve) hours. Patient not taking: Reported on 06/01/2019 04/14/19   Nicholes Mango, MD  polyethylene glycol (MIRALAX / GLYCOLAX) 17 g packet Take 17 g by mouth daily as needed for mild constipation. 04/14/19   Nicholes Mango, MD    Physical Exam: Vitals:   06/01/19 1541 06/01/19 1610 06/01/19 1855  BP:  (!) 168/110 (!) 141/55  Pulse:  88 80  Resp:  20 20  Temp:  98.2 F (36.8 C)   TempSrc:  Oral   SpO2:  96% 100%  Weight: (!) 149.7 kg    Height: 5' 3.5" (1.613 m)      Constitutional: NAD, calm, comfortable, nontoxic appearing a morbidly obese female laying at 80 degree incline in bed Vitals:   06/01/19 1541 06/01/19 1610 06/01/19 1855  BP:  (!) 168/110 (!) 141/55  Pulse:  88 80  Resp:  20 20  Temp:  98.2 F (36.8 C)   TempSrc:  Oral   SpO2:  96% 100%  Weight: (!) 149.7 kg    Height: 5' 3.5" (1.613 m)     Eyes: PERRL, lids and conjunctivae normal ENMT: Mucous membranes are moist. Posterior pharynx clear of any exudate or lesions. Neck: normal, supple, no masses Respiratory: clear to auscultation bilaterally, no wheezing, no crackles. Normal respiratory effort. No accessory muscle use.  Cardiovascular: Regular rate and rhythm, no murmurs / rubs / gallops.  Abdomen: no tenderness, no masses palpated.   Bowel sounds positive.  Musculoskeletal: no clubbing / cyanosis. No joint deformity upper and lower extremities. no contractures. Normal muscle tone.  Skin: Right lower extremity feels more taut with edema compared to left on medial upper thigh. There is more warmth to right compare to left LE especially in the distal portion of the LE. There is spreading erythema going up right anterior thigh compare to left.     Neurologic: CN 2-12 grossly intact. Sensation intact, DTR normal.  Had extreme difficulty lifting bilateral lower extremity due to body habitus.   Psychiatric: Normal judgment and insight. Alert and oriented x 3. Normal mood.      Labs on Admission: I have personally reviewed following labs and imaging studies  CBC: Recent Labs  Lab 06/01/19 1825  WBC 5.8  HGB 11.4*  HCT 36.4  MCV 82.2  PLT [id number]   Basic Metabolic Panel: Recent Labs  Lab 06/01/19 1825  NA 141  K 3.9  CL 102  CO2 27  GLUCOSE 79  BUN 17  CREATININE 0.95  CALCIUM 9.0   GFR: Estimated Creatinine Clearance: 88 mL/min (by C-G formula based on SCr of 0.95 mg/dL). Liver Function Tests: No results for input(s): AST, ALT, ALKPHOS, BILITOT, PROT, ALBUMIN in the last 168 hours. No results for input(s): LIPASE, AMYLASE in the last 168 hours. No results for input(s): AMMONIA in the last 168 hours. Coagulation Profile: No results for input(s): INR, PROTIME in the last 168 hours. Cardiac Enzymes: No results for input(s): CKTOTAL, CKMB, CKMBINDEX, TROPONINI in the last 168 hours. BNP (last 3 results) No results for  input(s): PROBNP in the last 8760 hours. HbA1C: No results for input(s): HGBA1C in the last 72 hours. CBG: No results for input(s): GLUCAP in the last 168 hours. Lipid Profile: No results for input(s): CHOL, HDL, LDLCALC, TRIG, CHOLHDL, LDLDIRECT in the last 72 hours. Thyroid Function Tests: No results for input(s): TSH, T4TOTAL, FREET4, T3FREE, THYROIDAB in the last 72 hours. Anemia Panel: No results for input(s): VITAMINB12, FOLATE, FERRITIN, TIBC, IRON, RETICCTPCT in the last 72 hours. Urine analysis:    Component Value Date/Time   COLORURINE YELLOW (A) 03/25/2019 1253   APPEARANCEUR CLEAR (A) 03/25/2019 1253   APPEARANCEUR Clear 05/18/2013 1725   LABSPEC 1.028 03/25/2019 1253   LABSPEC 1.011 05/18/2013 1725   PHURINE 5.0 03/25/2019 1253   GLUCOSEU NEGATIVE 03/25/2019 1253   GLUCOSEU Negative 05/18/2013 [place] 03/25/2019 [place] 03/25/2019 1253   BILIRUBINUR Negative 05/18/2013 [place] 03/25/2019 1253   PROTEINUR NEGATIVE 03/25/2019 1253   NITRITE NEGATIVE  03/25/2019 1253   LEUKOCYTESUR NEGATIVE 03/25/2019 1253   LEUKOCYTESUR Negative 05/18/2013 1725    Radiological Exams on Admission: US Venous Img Lower Unilateral Right  Result Date: 06/01/2019 CLINICAL DATA:  Acute pain and swelling x2 weeks EXAM: RIGHT LOWER EXTREMITY VENOUS DOPPLER ULTRASOUND TECHNIQUE: Gray-scale sonography with compression, as well as color and duplex ultrasound, were performed to evaluate the deep venous system from the level of the common femoral vein through the popliteal and proximal calf veins. Technologist describes technically difficult study secondary to body habitus and edema. COMPARISON:  04/10/2019 FINDINGS: Normal compressibility of the common femoral, superficial femoral, and popliteal veins, as well as the proximal calf veins. No filling defects to suggest DVT on grayscale or color Doppler imaging. Doppler waveforms show normal direction of venous flow, normal respiratory phasicity and response to augmentation. Subcutaneous calf edema. Survey views of the contralateral common femoral vein are unremarkable. IMPRESSION: No femoropopliteal and no calf DVT in the visualized calf veins. If clinical symptoms are inconsistent or if there are persistent or worsening symptoms, further imaging (possibly involving the iliac veins) may be warranted. Electronically Signed   By: Lucrezia Europe M.D.   On: 06/01/2019 17:02   Dg Knee Complete 4 Views Right  Result Date: 06/01/2019 CLINICAL DATA:  Right knee pain EXAM: RIGHT KNEE - COMPLETE 4+ VIEW COMPARISON:  None. FINDINGS: Bone detail limited due to morbid obesity. Negative for fracture. Severe degenerative change in the lateral compartment. Moderate degenerative change in spurring medially. Patellofemoral degenerative change. IMPRESSION: Negative for fracture.  Exam limited by obesity. Electronically Signed   By: Franchot Gallo M.D.   On: 06/01/2019 18:41    Assessment/Plan  Cellulitis of the right lower extremity in the setting  of chronic lymphedema and morbid obesity -Patient was given ciprofloxacin a week ago by outpatient wound care physician but continues have worsening erythema -She has a history of positive culture of Klebsiella and Pseudomonas in a previous left lower extremity cellulitis. 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  Complicates lymphedema.   DVT prophylaxis:.Lovenox Code Status:Full  Family Communication: Plan discussed with patient at bedside  disposition Plan: Home with at least 2 midnight stays  Consults called:  Admission status:  inpatient   Orene Desanctis DO [facility]   If 7PM-7AM, please contact night-coverage [email or web address] Password [facility]  06/01/2019, 9:56 PM

## 2019-06-01 NOTE — Progress Notes (Addendum)
[name], [name] ([id number]) Visit Report for 06/01/2019 Chief Complaint Document Details Patient Name: [name], [name]. Date of Service: 06/01/2019 2:30 PM Medical Record Number: [id number] Patient Account Number: [id number] Date of Birth/Sex: [birth year] (63 y.o. F) Treating RN: [name] Primary Care Provider: Tandy [name] Other Clinician: Referring Provider: Tandy [name] Treating Provider/Extender: [name], [name] in Treatment: 14 Information Obtained from: Patient Chief Complaint Bilateral LE lymphedema Electronic Signature(s) Signed: 06/03/2019 4:47:05 PM By: Elizabeth Keeler PA-C Previous Signature: 06/01/2019 2:21:50 PM Version By: Elizabeth Keeler PA-C Entered By: [name] on 06/03/2019 16:04:27 [name], [name] ([id number]) -------------------------------------------------------------------------------- HPI Details Patient Name: [name]. Date of Service: 06/01/2019 2:30 PM Medical Record Number: [id number] Patient Account Number: [id number] Date of Birth/Sex: [birth year] (63 y.o. F) Treating RN: [name] Primary Care Provider: Tandy [name] Other Clinician: Referring Provider: Tandy [name] Treating Provider/Extender: [name], [name] in Treatment: 14 History of Present Illness HPI Description: The patient is a 63 year old female with history of hypertension and a long-standing history of bilateral lower extremity lymphedema (first presented on 4/2) . She has had open ulcers in the past which have always responded to compression therapy. She had briefly been to a lymphedema clinic in the past which helped her at the time. this time around she stopped treatment of her lymphedema pumps approximately 2 [name] ago because of some pain in the knees and then noticed the right leg getting worse. She was seen by her PCP who put her on clindamycin 4 times a day 2 days ago. The patient has seen [facility] and Dr. Delana [name] had seen her last year where  a vascular study including venous and arterial duplex studies were within normal limits. he had recommended compression stockings and lymphedema pumps and the patient has been using this in about 2 [name] ago. She is known to be diabetic but in the past few time she's gone to her primary care doctor her hemoglobin A1c has been normal. 02/11/2015 - after her last visit she took my advice and went to the ER regarding the progressive cellulitis of her right lower extremity and she was admitted between July 17 and 22nd. She received IV antibiotics and then was sent home on a course of steroid-induced and oral antibiotics. She has improved much since then. 02/17/2015 -- she has been doing fine and the weeping of her legs has remarkably gone down. She has no fresh issues. READMISSION 01/15/18 This patient was given this clinic before most recently in 2016 seen by Dr. Con [name]. She has massive bilateral lymphedema and over the last 2 months this had weeping edema out of the left leg. She has compression pumps but her compliance with these has been minimal. She has [facility] they've been using TCA/ABDs/kerlix under an Ace wrap.she has had recent problems with cellulitis. She was apparently seen in the ER and 12/23/17 and given clindamycin. She was then followed by her primary doctor and given doxycycline and Keflex. The pain seems to have settled down. In April 2018 the patient had arterial studies done at [facility]. This showed triphasic waveforms throughout the right leg and mostly triphasic waveforms on the left except for monophasic at the posterior tibial artery distally. She was not felt to have evidence of right lower extremity arterial stenosis or significant problems on the left side. 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 She also had a right lower extremity venous Doppler in January 2018 this was  limited by the patient's body habitus and  lymphedema. Most of the proximal veins were not visualized The patient presents with an area of denuded skin on the anterior medial part of the left calf. There is weeping edema fluid here. 01/22/18; the patient has somewhat better edema control using her compression pumps twice a day and as a result she has much better epithelialization on the left anterior calf area. Only a small open area remains. 01/29/18; the patient has been compliant with her compression pumps. Both the areas on her calf that healed. The remaining area on the left anterior leg is fully epithelialized Readmission: 02/20/2019 upon evaluation today patient presents for reevaluation due to issues that she is having with the bilateral lower extremities. She actually has wounds open on both legs. On the right she has an area in the crease of her leg on the right around the knee region which is actually draining quite a bit and actually has some fungal type appearance to it. She has been on nystatin powder that seems to have helped to some degree. In regard to the left lower extremity this is actually in the [place]. ([id number]) lower portion of her leg closer to the ankle and again is continuing to drain as well unfortunately. There does not appear to be any signs of active infection at this time which is good news. No fevers, chills, nausea, vomiting, or diarrhea. She tells me that since she was seen last year she is actually been doing quite well for the most part with regard to her lower extremities. Unfortunately she now is experiencing a little bit more drainage at this time. She is concerned about getting this under control so that it does not get significantly worse. 02/27/2019 on evaluation today patient appears to be doing somewhat better in regard to her bilateral lower extremity wounds. She has been tolerating the dressing changes without complication. Fortunately there is no signs of active infection at  this point. No fevers, chills, nausea, vomiting, or diarrhea. She did get her dressing supplies which is excellent news she was extremely excited to get these. She also got paperwork from [employer] for their financial assistance program where they may be able to help her out in the future if needed with supplies at discounted prices. 03/06/2019 on evaluation today patient appears to be doing a little worse with regard to both areas of weeping on her bilateral lower extremities. This is around the right medial knee and just above the left ankle. With that being said she is unfortunately not doing as well as I would like to see. I feel like she may need to potentially go see someone at the lymphedema clinic as the wraps that she needs or even beyond what we can do here at the wound care center. She really does not have wounds she just has open areas of weeping that are causing some difficulty for her. Subsequently because of this and the moisture I am concerned about the potential for infection I am going to likely give her a prophylactic antibiotic today, Keflex, just to be on the safe side. Nonetheless again there is no obvious signs of active infection at this time. 03/13/2019 on evaluation today patient appears to be doing well with regard to her bilateral lower extremities where she has been weeping compared to even last week's evaluation. I see some areas of new skin growth which is excellent and overall I am very pleased with how things seem to be  progressing. No fevers, chills, nausea, vomiting, or diarrhea. 03/20/2019 on evaluation today patient unfortunately is continuing to have issues with significant edema of the left lower extremity. Her right side seems to be doing much better. Unfortunately her left side is showing increased weeping of the lower portion of her leg. This is quite unfortunate obviously we were hoping to get her into the lymphedema clinic they really do not seem to when I see her  how if she is draining. Despite the fact this is really not wound related but more lymphedema weeping related. Nonetheless I do not know that this can be helpful for her to even go for that appointment since again I am not sure there is much that they would actually do at this point. We may need to try a 4 layer compression wrap as best we can on her leg. She is on the Augmentin currently although I am still concerned about whether or not there could be potentially something going on infection wise I would obtain a culture though I understand is not the best being that is a surface culture I just 1 to make sure I do not seem to be missing anything. 03/27/2019 on evaluation today patient appears to be doing much better in regard to the left lower extremity compared to last week. Last week she had tremendous weeping which I think was subsequent to infection now she seems to be doing much better and very pleased. This is not completely healed but there is a lot of new skin growth and it has dried out quite a bit. Overall I think that we are doing well with how things are moving along at this time. No fevers, chills, nausea, vomiting, or diarrhea. 04/03/2019 evaluation today patient appears to be doing a little worse this week compared to last time I saw her. I think this may be due to the fact that she is having issues with not being able to sleep in her bed at least not until last night. She is therefore been in a lift chair and subsequently has also had issues with not been able to use her pumps since she could not get in bed. With that being said the patient overall seems to be doing okay I do think I may want extend the antibiotic for a little bit longer at least until we can see if her edema and her weeping gets better and if it is then obviously I can always discontinue the antibiotics as of next week however I want her to continue to have it over the next week. 04/10/2019 on evaluation today  patient unfortunately is still doing poorly with regard to her left lower extremity. Her right is all things considering doing fairly well. On the left however she continues to have spreading of the area of infection and weeping which appears to be even a larger surface area than noted last week. She did have a positive culture for Pseudomonas in particular which seems to have been of concern she still has green/yellow discharge consistent with Pseudomonas and subsequently a tremendous amount of it. This has me obviously still concerned about the infection not really clearing up despite the fact that on culture it appears the Cipro should have been a good option for treating this. I think she may at this point need IV antibiotics since things are not doing better I do not want to get worse and cause sepsis. She is in agreement with the plan and believes as well  that she likely does need to go to the hospital for IV vancomycin. Or something of the like depending on what the recommendation is from the ER. 04/17/2019 on evaluation today patient appears to be doing excellent in regard to her lower extremity on the left. She was in the hospital for several days from when I sent her last we saw her until just this past Tuesday. Fortunately her drainage is significantly improved and in fact is mostly clear. There is just a couple small areas that may still drain a little bit she states GALENA, BRIGGS. ([id number]) that the [facility] they prescribed for her at discharge she went picked up from pharmacy and got home but has not been able to find it since. She is looked everywhere. She is wondering if I will replace that for her today I will be more than happy to do that. 05/01/2019 on evaluation today patient actually appears to be doing quite well with regard to her lower extremities. She occasionally is having areas that will leak and then heal up mainly when a piece of the fibrotic skin pops off but  fortunately she is not having any signs of active infection at this time. Overall she also really does not have any obvious weeping at this time. I do believe however she really needs some compression wraps and I think this may be a good time to get her back to the lymphedema clinic. 05/11/2019 on evaluation today patient actually appears to be doing quite well with regard to her bilateral lower extremities. She occasionally will have a small area that we per another but in general seems to be completely healed which is great news. Overall very pleased with how everything seems to be progressing. She does have her appointment with lymphedema clinic on November 18. 05/25/2019 on evaluation today patient appears to be doing well with regard to her left lower extremity. I am very pleased in this regard. In regard to her right leg this actually did start draining more I think it is mainly due to the fact that her leg is more swollen. I am not seeing any obvious signs of infection at this time although that is definitely something were obviously acutely aware of simply due to the fact that she had an issue not too far back with exactly this issue. Nonetheless I do feel like that lymphedema clinic would still be beneficial for her. I explained obviously if they are not able to do anything treatment wise on the right leg we could at least have them treat her left leg and then proceed from there. The patient is really in agreement with that plan. If they are able to do both as the drainage slows down that I would be happy to let them handle both. 06/01/2019 on evaluation today patient unfortunately appears to be doing worse with regard to her right lower extremity. The left lower extremity is still maintaining at this point. Unfortunately she has been having significantly increased pain over the past several days and has been experiencing as well increased swelling of the right lower extremity. I really do  not know that I am seeing anything that appears to be obvious for infection at this point to be peripherally honest. With that being said the patient does seem to be having much more swelling that she is even experienced in the past and coupled with increased pain in her hip as well I am concerned that again she could potentially have a DVT although  I am not 100% sure of this. I think it something that may need to be checked out. We discussed the possibility of sending her for a DVT study through the hospital but unfortunately transportation is an issue if she does have a DVT I do not want her to wait days to be able to get in for that test however if she has this scheduled as an outpatient that is as fast that she will be able to get the test scheduled for transportation purposes. That will also fall on Thanksgiving so subsequently she did actually be looking at either Friday or even next week before we would know anything back from this. That is much too long in my opinion. Subsequent to the amount of discomfort she is experiencing the patient is actually okay with going to the ER for evaluation today. Electronic Signature(s) Signed: 06/01/2019 5:37:09 PM By: Elizabeth Keeler PA-C Entered By: [name] on 06/01/2019 17:37:09 [name], [name] ([id number]) -------------------------------------------------------------------------------- Physical Exam Details Patient Name: [name], [name] Date of Service: 06/01/2019 2:30 PM Medical Record Number: [id number] Patient Account Number: [id number] Date of Birth/Sex: 12/30/1955 (63 y.o. F) Treating RN: [name] Primary Care Provider: Tandy [name] Other Clinician: Referring Provider: Tandy [name] Treating Provider/Extender: [name], [name] in Treatment: 14 Constitutional Obese and well-hydrated in no acute distress. Respiratory normal breathing without difficulty. clear to auscultation bilaterally. Cardiovascular regular rate  and rhythm with normal S1, S2. Psychiatric this patient is able to make decisions and demonstrates good insight into disease process. Alert and Oriented x 3. patient is agitated. Notes Patient's wound actually appears to be doing somewhat better in my opinion with regard to the right lower extremity unfortunately her swelling is quite significantly worse compared to previous. Unfortunately I feel like that the patient is not doing nearly as well as she was just a couple [name] ago I am concerned about the possibility of a DVT. Electronic Signature(s) Signed: 06/01/2019 5:37:56 PM By: Elizabeth Keeler PA-C Entered By: [name] on 06/01/2019 17:37:56 [name], [name] ([id number]) -------------------------------------------------------------------------------- Physician Orders Details Patient Name: [name] Date of Service: 06/01/2019 2:30 PM Medical Record Number: [id number] Patient Account Number: [id number] Date of Birth/Sex: 1955-11-29 (63 y.o. F) Treating RN: [name] Primary Care Provider: Tandy [name] Other Clinician: Referring Provider: Tandy [name] Treating Provider/Extender: [name], [name] in Treatment: 14 Verbal / Phone Orders: No Diagnosis Coding ICD-10 Coding Code Description I89.0 Lymphedema, not elsewhere classified L97.822 Non-pressure chronic ulcer of other part of left lower leg with fat layer exposed L97.812 Non-pressure chronic ulcer of other part of right lower leg with fat layer exposed Wound Cleansing o Dial antibacterial soap, wash wounds, rinse and pat dry prior to dressing wounds o May Shower, gently pat wound dry prior to applying new dressing. Primary Wound Dressing o Silver Alginate - Nystatin powder under silver alginate Secondary Dressing o ABD and Kerlix/Conform o XtraSorb Dressing Change Frequency o Change dressing every other day. - and as needed Follow-up Appointments o Return Appointment in 1  week. Edema Control o Compression Pump: Use compression pump on left lower extremity for 60 minutes, twice daily. o Compression Pump: Use compression pump on right lower extremity for 60 minutes, twice daily. o Other: - ACE Wraps Electronic Signature(s) Signed: 06/01/2019 5:37:56 PM By: [name] Signed: 06/01/2019 10:15:26 PM By: Elizabeth Keeler PA-C Entered By: [name] on 06/01/2019 14:59:45 [name], [name] ([id number]) -------------------------------------------------------------------------------- Problem List Details Patient Name: [name], SYBESMA.  Date of Service: 06/01/2019 2:30 PM Medical Record Number: [id number] Patient Account Number: [id number] Date of Birth/Sex: 1956-01-08 (63 y.o. F) Treating RN: [name] Primary Care Provider: Tandy [name] Other Clinician: Referring Provider: Tandy [name] Treating Provider/Extender: [name], [name] in Treatment: 14 Active Problems ICD-10 Evaluated Encounter Code Description Active Date Today Diagnosis I89.0 Lymphedema, not elsewhere classified 02/20/2019 No Yes L97.822 Non-pressure chronic ulcer of other part of left lower leg with 02/20/2019 No Yes fat layer exposed L97.812 Non-pressure chronic ulcer of other part of right lower leg 02/20/2019 No Yes with fat layer exposed Inactive Problems Resolved Problems Electronic Signature(s) Signed: 06/01/2019 2:21:38 PM By: Elizabeth Keeler PA-C Entered By: [name] on 06/01/2019 14:21:37 [name], [name] ([id number]) -------------------------------------------------------------------------------- Progress Note Details Patient Name: [name]. Date of Service: 06/01/2019 2:30 PM Medical Record Number: [id number] Patient Account Number: [id number] Date of Birth/Sex: 1956-03-01 (63 y.o. F) Treating RN: [name] Primary Care Provider: Tandy [name] Other Clinician: Referring Provider: Tandy [name] Treating Provider/Extender: Elizabeth  [name], [name] in Treatment: 14 Subjective Chief Complaint Information obtained from Patient Bilateral LE lymphedema History of Present Illness (HPI) The patient is a 63 year old female with history of hypertension and a long-standing history of bilateral lower extremity lymphedema (first presented on 4/2) . She has had open ulcers in the past which have always responded to compression therapy. She had briefly been to a lymphedema clinic in the past which helped her at the time. this time around she stopped treatment of her lymphedema pumps approximately 2 [name] ago because of some pain in the knees and then noticed the right leg getting worse. She was seen by her PCP who put her on clindamycin 4 times a day 2 days ago. The patient has seen [facility] and Dr. Delana [name] had seen her last year where a vascular study including venous and arterial duplex studies were within normal limits. he had recommended compression stockings and lymphedema pumps and the patient has been using this in about 2 [name] ago. She is known to be diabetic but in the past few time she's gone to her primary care doctor her hemoglobin A1c has been normal. 02/11/2015 - after her last visit she took my advice and went to the ER regarding the progressive cellulitis of her right lower extremity and she was admitted between July 17 and 22nd. She received IV antibiotics and then was sent home on a course of steroid-induced and oral antibiotics. She has improved much since then. 02/17/2015 -- she has been doing fine and the weeping of her legs has remarkably gone down. She has no fresh issues. READMISSION 01/15/18 This patient was given this clinic before most recently in 2016 seen by Dr. Con [name]. She has massive bilateral lymphedema and over the last 2 months this had weeping edema out of the left leg. She has compression pumps but her compliance with these has been minimal. She has [facility] they've been using  TCA/ABDs/kerlix under an Ace wrap.she has had recent problems with cellulitis. She was apparently seen in the ER and 12/23/17 and given clindamycin. She was then followed by her primary doctor and given doxycycline and Keflex. The pain seems to have settled down. In April 2018 the patient had arterial studies done at [facility]. This showed triphasic waveforms throughout the right leg and mostly triphasic waveforms on the left except for monophasic at the posterior tibial artery distally. She was not felt to have evidence of right lower extremity arterial  stenosis or significant problems on the left side. 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 She also had a right lower extremity venous Doppler in January 2018 this was limited by the patient's body habitus and lymphedema. Most of the proximal veins were not visualized The patient presents with an area of denuded skin on the anterior medial part of the left calf. There is weeping edema fluid here. 01/22/18; the patient has somewhat better edema control using her compression pumps twice a day and as a result she has much better epithelialization on the left anterior calf area. Only a small open area remains. 01/29/18; the patient has been compliant with her compression pumps. Both the areas on her calf that healed. The remaining area on the left anterior leg is fully epithelialized [name], SZAFRAN. ([id number]) Readmission: 02/20/2019 upon evaluation today patient presents for reevaluation due to issues that she is having with the bilateral lower extremities. She actually has wounds open on both legs. On the right she has an area in the crease of her leg on the right around the knee region which is actually draining quite a bit and actually has some fungal type appearance to it. She has been on nystatin powder that seems to have helped to some degree. In regard to the left lower extremity this is actually in  the lower portion of her leg closer to the ankle and again is continuing to drain as well unfortunately. There does not appear to be any signs of active infection at this time which is good news. No fevers, chills, nausea, vomiting, or diarrhea. She tells me that since she was seen last year she is actually been doing quite well for the most part with regard to her lower extremities. Unfortunately she now is experiencing a little bit more drainage at this time. She is concerned about getting this under control so that it does not get significantly worse. 02/27/2019 on evaluation today patient appears to be doing somewhat better in regard to her bilateral lower extremity wounds. She has been tolerating the dressing changes without complication. Fortunately there is no signs of active infection at this point. No fevers, chills, nausea, vomiting, or diarrhea. She did get her dressing supplies which is excellent news she was extremely excited to get these. She also got paperwork from [employer] for their financial assistance program where they may be able to help her out in the future if needed with supplies at discounted prices. 03/06/2019 on evaluation today patient appears to be doing a little worse with regard to both areas of weeping on her bilateral lower extremities. This is around the right medial knee and just above the left ankle. With that being said she is unfortunately not doing as well as I would like to see. I feel like she may need to potentially go see someone at the lymphedema clinic as the wraps that she needs or even beyond what we can do here at the wound care center. She really does not have wounds she just has open areas of weeping that are causing some difficulty for her. Subsequently because of this and the moisture I am concerned about the potential for infection I am going to likely give her a prophylactic antibiotic today, Keflex, just to be on the safe side. Nonetheless again there  is no obvious signs of active infection at this time. 03/13/2019 on evaluation today patient appears to be doing well with regard to her bilateral lower extremities where  she has been weeping compared to even last week's evaluation. I see some areas of new skin growth which is excellent and overall I am very pleased with how things seem to be progressing. No fevers, chills, nausea, vomiting, or diarrhea. 03/20/2019 on evaluation today patient unfortunately is continuing to have issues with significant edema of the left lower extremity. Her right side seems to be doing much better. Unfortunately her left side is showing increased weeping of the lower portion of her leg. This is quite unfortunate obviously we were hoping to get her into the lymphedema clinic they really do not seem to when I see her how if she is draining. Despite the fact this is really not wound related but more lymphedema weeping related. Nonetheless I do not know that this can be helpful for her to even go for that appointment since again I am not sure there is much that they would actually do at this point. We may need to try a 4 layer compression wrap as best we can on her leg. She is on the Augmentin currently although I am still concerned about whether or not there could be potentially something going on infection wise I would obtain a culture though I understand is not the best being that is a surface culture I just 1 to make sure I do not seem to be missing anything. 03/27/2019 on evaluation today patient appears to be doing much better in regard to the left lower extremity compared to last week. Last week she had tremendous weeping which I think was subsequent to infection now she seems to be doing much better and very pleased. This is not completely healed but there is a lot of new skin growth and it has dried out quite a bit. Overall I think that we are doing well with how things are moving along at this time. No fevers,  chills, nausea, vomiting, or diarrhea. 04/03/2019 evaluation today patient appears to be doing a little worse this week compared to last time I saw her. I think this may be due to the fact that she is having issues with not being able to sleep in her bed at least not until last night. She is therefore been in a lift chair and subsequently has also had issues with not been able to use her pumps since she could not get in bed. With that being said the patient overall seems to be doing okay I do think I may want extend the antibiotic for a little bit longer at least until we can see if her edema and her weeping gets better and if it is then obviously I can always discontinue the antibiotics as of next week however I want her to continue to have it over the next week. 04/10/2019 on evaluation today patient unfortunately is still doing poorly with regard to her left lower extremity. Her right is all things considering doing fairly well. On the left however she continues to have spreading of the area of infection and weeping which appears to be even a larger surface area than noted last week. She did have a positive culture for Pseudomonas in particular which seems to have been of concern she still has green/yellow discharge consistent with Pseudomonas and subsequently a tremendous amount of it. This has me obviously still concerned about the infection not really clearing up despite the fact that on culture it appears the Cipro should have been a good option for treating this. I think she may at  this point need IV antibiotics since things are not doing better I do not want to get worse and cause sepsis. She is in agreement [name], STAIRS. ([id number]) with the plan and believes as well that she likely does need to go to the hospital for IV vancomycin. Or something of the like depending on what the recommendation is from the ER. 04/17/2019 on evaluation today patient appears to be doing excellent in regard  to her lower extremity on the left. She was in the hospital for several days from when I sent her last we saw her until just this past Tuesday. Fortunately her drainage is significantly improved and in fact is mostly clear. There is just a couple small areas that may still drain a little bit she states that the [facility] they prescribed for her at discharge she went picked up from pharmacy and got home but has not been able to find it since. She is looked everywhere. She is wondering if I will replace that for her today I will be more than happy to do that. 05/01/2019 on evaluation today patient actually appears to be doing quite well with regard to her lower extremities. She occasionally is having areas that will leak and then heal up mainly when a piece of the fibrotic skin pops off but fortunately she is not having any signs of active infection at this time. Overall she also really does not have any obvious weeping at this time. I do believe however she really needs some compression wraps and I think this may be a good time to get her back to the lymphedema clinic. 05/11/2019 on evaluation today patient actually appears to be doing quite well with regard to her bilateral lower extremities. She occasionally will have a small area that we per another but in general seems to be completely healed which is great news. Overall very pleased with how everything seems to be progressing. She does have her appointment with lymphedema clinic on November 18. 05/25/2019 on evaluation today patient appears to be doing well with regard to her left lower extremity. I am very pleased in this regard. In regard to her right leg this actually did start draining more I think it is mainly due to the fact that her leg is more swollen. I am not seeing any obvious signs of infection at this time although that is definitely something were obviously acutely aware of simply due to the fact that she had an issue not too far  back with exactly this issue. Nonetheless I do feel like that lymphedema clinic would still be beneficial for her. I explained obviously if they are not able to do anything treatment wise on the right leg we could at least have them treat her left leg and then proceed from there. The patient is really in agreement with that plan. If they are able to do both as the drainage slows down that I would be happy to let them handle both. 06/01/2019 on evaluation today patient unfortunately appears to be doing worse with regard to her right lower extremity. The left lower extremity is still maintaining at this point. Unfortunately she has been having significantly increased pain over the past several days and has been experiencing as well increased swelling of the right lower extremity. I really do not know that I am seeing anything that appears to be obvious for infection at this point to be peripherally honest. With that being said the patient does seem to be having much  more swelling that she is even experienced in the past and coupled with increased pain in her hip as well I am concerned that again she could potentially have a DVT although I am not 100% sure of this. I think it something that may need to be checked out. We discussed the possibility of sending her for a DVT study through the hospital but unfortunately transportation is an issue if she does have a DVT I do not want her to wait days to be able to get in for that test however if she has this scheduled as an outpatient that is as fast that she will be able to get the test scheduled for transportation purposes. That will also fall on Thanksgiving so subsequently she did actually be looking at either Friday or even next week before we would know anything back from this. That is much too long in my opinion. Subsequent to the amount of discomfort she is experiencing the patient is actually okay with going to the ER for evaluation today. Patient  History Information obtained from Patient. Family History Cancer - Father, Diabetes - Father, Heart Disease - Mother, Hypertension - Mother,Father, Seizures - Mother, Stroke - Mother, Thyroid Problems - Mother,Siblings, No family history of Hereditary Spherocytosis, Kidney Disease, Lung Disease, Tuberculosis. Social History Former smoker - uses snuff, Marital Status - Single, Alcohol Use - Rarely, Drug Use - No History, Caffeine Use - Daily. 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 Denies history of Aspiration, Chronic Obstructive Pulmonary Disease (COPD), Pneumothorax Cardiovascular Patient has history of Hypertension Denies history of Angina, Arrhythmia, Congestive Heart Failure, Coronary Artery Disease, Deep Vein Thrombosis, Hypotension, Myocardial Infarction, Peripheral Arterial Disease, Peripheral Venous Disease, Phlebitis, Vasculitis Gastrointestinal Denies history of Cirrhosis , Colitis, Crohn s, Hepatitis A, Hepatitis B, Hepatitis C Endocrine Denies history of Type I Diabetes, Type II Diabetes Genitourinary Denies history of End Stage Renal Disease Immunological Denies history of Lupus Erythematosus, Raynaud s, Scleroderma Integumentary (Skin) Denies history of History of Burn, History of pressure wounds Musculoskeletal Patient has history of Gout, Osteoarthritis Denies history of Rheumatoid Arthritis, Osteomyelitis Neurologic Denies history of Dementia, Neuropathy, Quadriplegia, Paraplegia, Seizure Disorder Oncologic Denies history  of Received Chemotherapy, Received Radiation Psychiatric Denies history of Anorexia/bulimia, Confinement Anxiety Review of Systems (ROS) Constitutional Symptoms (General Health) Denies complaints or symptoms of Fatigue, Fever, Chills, Marked Weight Change. Respiratory Denies complaints or symptoms of Chronic or frequent coughs, Shortness of Breath. Cardiovascular Complains or has symptoms of LE edema. Denies complaints or symptoms of Chest pain. Psychiatric Denies complaints or symptoms of Anxiety, Claustrophobia. Objective Constitutional Obese and well-hydrated in no acute distress. Vitals Time Taken: 2:33 PM, Height: 62 in, Weight: 390 lbs, BMI: 71.3, Temperature: 98.4 F, Pulse: 87 bpm, Respiratory Rate: 22 breaths/min, Blood Pressure: 168/83 mmHg. Respiratory Arrambide, [place] ([id number]) normal breathing without difficulty. clear to auscultation bilaterally. Cardiovascular regular rate and rhythm with normal S1, S2. Psychiatric this patient is able to make decisions and demonstrates good insight into disease process. Alert and Oriented x 3. patient is agitated. General Notes: Patient's wound actually appears to be doing somewhat better in my opinion with regard to the right lower extremity unfortunately her  swelling is quite significantly worse compared to previous. Unfortunately I feel like that the patient is not doing nearly as well as she was just a couple [name] ago I am concerned about the possibility of a DVT. Other Condition(s) Patient presents with Lymphedema located on the Right Leg. Assessment Active Problems ICD-10 Lymphedema, not elsewhere classified Non-pressure chronic ulcer of other part of left lower leg with fat layer exposed Non-pressure chronic ulcer of other part of right lower leg with fat layer exposed Plan Wound Cleansing: Dial antibacterial soap, wash wounds, rinse and pat dry prior to dressing wounds May Shower, gently pat wound dry prior to  applying new dressing. Primary Wound Dressing: Silver Alginate - Nystatin powder under silver alginate Secondary Dressing: ABD and Kerlix/Conform XtraSorb Dressing Change Frequency: Change dressing every other day. - and as needed Follow-up Appointments: Return Appointment in 1 week. Edema Control: Compression Pump: Use compression pump on left lower extremity for 60 minutes, twice daily. Compression Pump: Use compression pump on right lower extremity for 60 minutes, twice daily. Other: - ACE Wraps 1. My suggestion at this time is good to be that we go ahead and initiate just a cover dressing at this point for the patient in order to help maintain moisture control until she can get to the ER for further evaluation. She is going to go to the ER today to JENECIA, WINEGAR. ([id number]) be evaluated for the increased swelling in her right lower extremity and hopefully to rule out a DVT as well. 2. With regard to her left lower extremity lymphedema clinic is treating her for this at this point and really cannot do anything for her right until we get the wound completely closed fortunately the wound is showing some signs of improvement. 3. I did contact the ER to let them know where things stand and why I am sending the patient to them for further evaluation. Unfortunately I was not able to print a note to send with her as our electronic medical record is not functioning properly at this point but nonetheless this will be dictated and reflected in the note once that is back up and running. We will see patient back for reevaluation in 1 week here in the clinic. If anything worsens or changes patient will contact our office for additional recommendations. Electronic Signature(s) Signed: 06/03/2019 4:47:05 PM By: Elizabeth Keeler PA-C Previous Signature: 06/01/2019 5:38:18 PM Version By: Elizabeth Keeler PA-C Entered By: [name] on 06/03/2019 16:05:01 Edick, [name]  ([id number]) -------------------------------------------------------------------------------- ROS/PFSH Details Patient Name: [name] Date of Service: 06/01/2019 2:30 PM Medical Record Number: [id number] Patient Account Number: [id number] Date of Birth/Sex: Apr 23, 1956 (63 y.o. 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 Negative for: Aspiration; Chronic Obstructive Pulmonary Disease (COPD); Pneumothorax Cardiovascular Complaints and Symptoms: Positive for: LE edema Negative for: Chest pain Medical History: Positive for: Hypertension Negative for: Angina; Arrhythmia; Congestive Heart Failure; Coronary Artery Disease; Deep Vein Thrombosis; Hypotension; Myocardial Infarction; Peripheral Arterial Disease; Peripheral Venous Disease; Phlebitis; Vasculitis Psychiatric Complaints and Symptoms: Negative for: Anxiety; Claustrophobia Medical History: Negative for: Anorexia/bulimia; Confinement Anxiety Eyes  Medical History: Positive for: Cataracts Negative for: Glaucoma; Optic Neuritis Ear/Nose/Mouth/Throat Medical History: Negative for: Chronic sinus problems/congestion; Middle ear problems Hematologic/Lymphatic Medical History: Positive for: Lymphedema RANDELLE, ANKENY. ([id number]) Negative for: Anemia; Hemophilia; Human Immunodeficiency Virus; Sickle Cell Disease Gastrointestinal Medical History: Negative for: Cirrhosis ; Colitis; Crohnos; Hepatitis A; Hepatitis B;  Hepatitis C Endocrine Medical History: Negative for: Type I Diabetes; Type II Diabetes Genitourinary Medical History: Negative for: End Stage Renal Disease Immunological Medical History: Negative for: Lupus Erythematosus; Raynaudos; Scleroderma Integumentary (Skin) Medical History: Negative for: History of Burn; History of pressure wounds Musculoskeletal Medical History: Positive for: Gout; Osteoarthritis Negative for: Rheumatoid Arthritis; Osteomyelitis Neurologic Medical History: Negative for: Dementia; Neuropathy; Quadriplegia; Paraplegia; Seizure Disorder Oncologic Medical History: Negative for: Received Chemotherapy; Received Radiation HBO Extended History Items Eyes: Cataracts Immunizations Pneumococcal Vaccine: Received Pneumococcal Vaccination: Yes Implantable Devices No devices added Family and Social History Cancer: Yes - Father; Diabetes: Yes - Father; Heart Disease: Yes - Mother; Hereditary Spherocytosis: No; Hypertension: Yes - Mother,Father; Kidney Disease: No; Lung Disease: No; Seizures: Yes - Mother; Stroke: Yes - Mother; Thyroid Problems: Yes - Mother,Siblings; Tuberculosis: No; Former smoker - uses snuff; Marital Status - Single; Alcohol Use: Rarely; Drug Use: URA, KEPHART C. ([id number]) No History; Caffeine Use: Daily; Financial Concerns: No; Food, Clothing or Shelter Needs: No; Support System Lacking: No; Transportation Concerns: No Physician Affirmation I have reviewed and agree with the above information. Electronic Signature(s) Signed: 06/01/2019 5:37:56 PM By: [name] Signed: 06/01/2019 10:15:26 PM By: Elizabeth Keeler PA-C Entered By: [name] on 06/01/2019 17:37:29 Kulzer, [name] ([id number]) -------------------------------------------------------------------------------- SuperBill Details Patient Name: [name]. Date of Service: 06/01/2019 Medical Record Number: [id number] Patient Account Number: [id number] Date of  Birth/Sex: Nov 25, 1955 (63 y.o. F) Treating RN: [name] Primary Care Provider: Tandy [name] Other Clinician: Referring Provider: Tandy [name] Treating Provider/Extender: [name], [name] in Treatment: 14 Diagnosis Coding ICD-10 Codes Code Description I89.0 Lymphedema, not elsewhere classified L97.822 Non-pressure chronic ulcer of other part of left lower leg with fat layer exposed L97.812 Non-pressure chronic ulcer of other part of right lower leg with fat layer exposed Facility Procedures CPT4 Code: [id number] Description: 99213 - WOUND CARE VISIT-LEV 3 EST PT Modifier: Quantity: 1 Physician Procedures CPT4 Code Description: [id number] - WC PHYS LEVEL 4 - EST PT ICD-10 Diagnosis Description I89.0 Lymphedema, not elsewhere classified L97.822 Non-pressure chronic ulcer of other part of left lower leg wit L97.812 Non-pressure chronic ulcer of other  part of right lower leg wi Modifier: h fat layer expos th fat layer expo Quantity: 1 ed sed Electronic Signature(s) Signed: 06/01/2019 5:38:37 PM By: Elizabeth Keeler PA-C Entered By: [name] on 06/01/2019 17:38:37

## 2019-06-01 NOTE — Progress Notes (Signed)
Pharmacy Antibiotic Note  [name] is a 63 y.o. female admitted on 06/01/2019 with cellulitis.  Pharmacy has been consulted for Vancomycin, Cefepime dosing.  Plan: Cefepime 2 gm IV X 1 given in ED on 11/23 @ 2024. Cefepime 2 gm IV Q8H ordered to continue on 11/24 @ 0430.  Vancomycin 2500 mg IV X 1 ordered to be given on 11/23 @ 2300. Vancomycin 1750 mg IV Q24H ordered to continue on 11/24 @ 2300.  No peak or trough currently ordered.   Vd = 74.9 L  Ke = 0.047 hr-1 T1/2 = 14.8 hrs AUC = 498.2  Vanc trough = 12.3 mcg/mL   Height: 5' 3.5" (161.3 cm) Weight: (!) 330 lb (149.7 kg) IBW/kg (Calculated) : 53.55  Temp (24hrs), Avg:98.2 F (36.8 C), Min:98.2 F (36.8 C), Max:98.2 F (36.8 C)  Recent Labs  Lab 06/01/19 1825  WBC 5.8  CREATININE 0.95    Estimated Creatinine Clearance: 88 mL/min (by C-G formula based on SCr of 0.95 mg/dL).    Allergies  Allergen Reactions  . Ace Inhibitors Hives and Swelling  . Ibuprofen Hives and Swelling    Antimicrobials this admission:   >>    >>   Dose adjustments this admission:   Microbiology results:  BCx:   UCx:    Sputum:    MRSA PCR:   Thank you for allowing pharmacy to be a part of this patient's care.  Dedrick Heffner D 06/01/2019 10:29 PM

## 2019-06-02 ENCOUNTER — Ambulatory Visit: Payer: Medicaid Other | Admitting: Occupational Therapy

## 2019-06-02 DIAGNOSIS — I890 Lymphedema, not elsewhere classified: Secondary | ICD-10-CM

## 2019-06-02 DIAGNOSIS — G4733 Obstructive sleep apnea (adult) (pediatric): Secondary | ICD-10-CM

## 2019-06-02 DIAGNOSIS — Z888 Allergy status to other drugs, medicaments and biological substances status: Secondary | ICD-10-CM

## 2019-06-02 DIAGNOSIS — Z886 Allergy status to analgesic agent status: Secondary | ICD-10-CM

## 2019-06-02 DIAGNOSIS — L03115 Cellulitis of right lower limb: Principal | ICD-10-CM

## 2019-06-02 DIAGNOSIS — F17220 Nicotine dependence, chewing tobacco, uncomplicated: Secondary | ICD-10-CM

## 2019-06-02 DIAGNOSIS — I10 Essential (primary) hypertension: Secondary | ICD-10-CM

## 2019-06-02 DIAGNOSIS — Z79899 Other long term (current) drug therapy: Secondary | ICD-10-CM

## 2019-06-02 DIAGNOSIS — Z9989 Dependence on other enabling machines and devices: Secondary | ICD-10-CM

## 2019-06-02 LAB — CBC
HCT: 34.8 % — ABNORMAL LOW (ref 36.0–46.0)
Hemoglobin: 11 g/dL — ABNORMAL LOW (ref 12.0–15.0)
MCH: 26.1 pg (ref 26.0–34.0)
MCHC: 31.6 g/dL (ref 30.0–36.0)
MCV: 82.5 fL (ref 80.0–100.0)
Platelets: 160 10*3/uL (ref 150–400)
RBC: 4.22 MIL/uL (ref 3.87–5.11)
RDW: 14.7 % (ref 11.5–15.5)
WBC: 4.3 10*3/uL (ref 4.0–10.5)
nRBC: 0 % (ref 0.0–0.2)

## 2019-06-02 LAB — BASIC METABOLIC PANEL
Anion gap: 7 (ref 5–15)
BUN: 15 mg/dL (ref 8–23)
CO2: 29 mmol/L (ref 22–32)
Calcium: 8.6 mg/dL — ABNORMAL LOW (ref 8.9–10.3)
Chloride: 104 mmol/L (ref 98–111)
Creatinine, Ser: 0.94 mg/dL (ref 0.44–1.00)
GFR calc Af Amer: >60 mL/min (ref >60)
GFR calc non Af Amer: >60 mL/min (ref >60)
Glucose, Bld: 111 mg/dL — ABNORMAL HIGH (ref 70–99)
Potassium: 3.9 mmol/L (ref 3.5–5.1)
Sodium: 140 mmol/L (ref 135–145)

## 2019-06-02 LAB — HIV ANTIBODY (ROUTINE TESTING W REFLEX): HIV Screen 4th Generation wRfx: NONREACTIVE

## 2019-06-02 LAB — SARS CORONAVIRUS 2 (TAT 6-24 HRS): SARS Coronavirus 2: NEGATIVE

## 2019-06-02 MED ORDER — RISAQUAD PO CAPS
1.0000 | ORAL_CAPSULE | Freq: Every day | ORAL | Status: DC
  Administered 2019-06-02 – 2019-06-07 (×6): 1 via ORAL
  Filled 2019-06-02 (×6): qty 1

## 2019-06-02 MED ORDER — DIPHENHYDRAMINE HCL 25 MG PO CAPS
25.0000 mg | ORAL_CAPSULE | Freq: Three times a day (TID) | ORAL | Status: DC | PRN
  Administered 2019-06-02: 25 mg via ORAL
  Filled 2019-06-02: qty 1

## 2019-06-02 MED ORDER — CEFAZOLIN SODIUM-DEXTROSE 1-4 GM/50ML-% IV SOLN
1.0000 g | Freq: Three times a day (TID) | INTRAVENOUS | Status: DC
  Filled 2019-06-02 (×3): qty 50

## 2019-06-02 MED ORDER — CEFAZOLIN SODIUM-DEXTROSE 2-4 GM/100ML-% IV SOLN
2.0000 g | Freq: Three times a day (TID) | INTRAVENOUS | Status: DC
  Administered 2019-06-02 – 2019-06-07 (×14): 2 g via INTRAVENOUS
  Filled 2019-06-02 (×17): qty 100

## 2019-06-02 NOTE — Consult Note (Signed)
NAME: [name]  DOB: [birth year]  MRN: [id number]  Date/Time: 06/02/2019 7:42 PM  REQUESTING PROVIDER: Loleta Books Subjective:  REASON FOR CONSULT: lymphedema/cellulitis ? [name] is a 64 y.o. with a history of lymphedema legs followed at [facility] was sent to the ED on 11/23 to r/o DVT of RLE Pt has had increasing apin and swelling to the rt leg from midcalf to mid thigh She was last admitted in [facility] and was treated with IV cefepime because of pseudomonas and kleb in the skin culture from sept. In the ED vitals were BP (!) 141/55 (BP Location: Right Arm)   Pulse 80   Temp 98.2 F (36.8 C) (Oral)   Resp 20   WBC N, cr N Doppler r/o DVT.  Seen by Vascular who recommended compression wraps, elevation, lymph pump and lymphatic massage Past Medical History:  Diagnosis Date  . Arthritis   . Asthma   . Enlarged heart   . Hypertension   . Lymphedema   . Sleep apnea   . Spinal stenosis     Past Surgical History:  Procedure Laterality Date  . COLONOSCOPY WITH PROPOFOL N/A 03/12/2018   Procedure: COLONOSCOPY WITH PROPOFOL;  Surgeon: Manya Silvas, MD;  Location: [facility] ENDOSCOPY;  Service: Endoscopy;  Laterality: N/A;  . NO PAST SURGERIES      Social History   Socioeconomic History  . Marital status: Single    Spouse name: Not on file  . Number of children: Not on file  . Years of education: Not on file  . Highest education level: Not on file  Occupational History  . Not on file  Social Needs  . Financial resource strain: Not on file  . Food insecurity    Worry: Not on file    Inability: Not on file  . Transportation needs    Medical: Not on file    Non-medical: Not on file  Tobacco Use  . Smoking status: Former Research scientist (life sciences)  . Smokeless tobacco: Current User    Types: Snuff  Substance and Sexual Activity  . Alcohol use: No  . Drug use: No  . Sexual activity: Not on file  Lifestyle  . Physical activity    Days per week: Not on file    Minutes  per session: Not on file  . Stress: Not on file  Relationships  . Social Herbalist on phone: Not on file    Gets together: Not on file    Attends religious service: Not on file    Active member of club or organization: Not on file    Attends meetings of clubs or organizations: Not on file    Relationship status: Not on file  . Intimate partner violence    Fear of current or ex partner: Not on file    Emotionally abused: Not on file    Physically abused: Not on file    Forced sexual activity: Not on file  Other Topics Concern  . Not on file  Social History Narrative  . Not on file    Family History  Problem Relation Age of Onset  . Thyroid disease Other   . Breast cancer Maternal Aunt    Allergies  Allergen Reactions  . Ace Inhibitors Hives and Swelling  . Ibuprofen Hives and Swelling    ? Current Facility-Administered Medications  Medication Dose Route Frequency Provider Last Rate Last Dose  . acetaminophen (TYLENOL) tablet 325-650 mg  325-650 mg Oral Q6H PRN  Ileene Musa T, DO   650 mg at 06/02/19 0932  . atenolol (TENORMIN) tablet 50 mg  50 mg Oral Daily Tu, Ching T, DO   50 mg at 06/02/19 0932  . ceFAZolin (ANCEF) IVPB 2g/100 mL premix  2 g Intravenous Q8H Danford, Suann Larry, MD 200 mL/hr at 06/02/19 1739 2 g at 06/02/19 1739  . diphenhydrAMINE (BENADRYL) capsule 25 mg  25 mg Oral Q8H PRN Gardiner Barefoot, NP   25 mg at 06/02/19 0155  . enoxaparin (LOVENOX) injection 40 mg  40 mg Subcutaneous Q12H Tu, Ching T, DO   40 mg at 06/02/19 0932  . polyethylene glycol (MIRALAX / GLYCOLAX) packet 17 g  17 g Oral Daily PRN Tu, Ching T, DO         Abtx:  Anti-infectives (From admission, onward)   Start     Dose/Rate Route Frequency Ordered Stop   06/02/19 2300  vancomycin (VANCOCIN) 1,750 mg in sodium chloride 0.9 % 500 mL IVPB  Status:  Discontinued     1,750 mg 250 mL/hr over 120 Minutes Intravenous Every 24 hours 06/01/19 2223 06/02/19 0815   06/02/19 1700   ceFAZolin (ANCEF) IVPB 2g/100 mL premix     2 g 200 mL/hr over 30 Minutes Intravenous Every 8 hours 06/02/19 1128     06/02/19 0900  ceFAZolin (ANCEF) IVPB 1 g/50 mL premix  Status:  Discontinued     1 g 100 mL/hr over 30 Minutes Intravenous Every 8 hours 06/02/19 0829 06/02/19 1128   06/02/19 0430  ceFEPIme (MAXIPIME) 2 g in sodium chloride 0.9 % 100 mL IVPB  Status:  Discontinued     2 g 200 mL/hr over 30 Minutes Intravenous Every 8 hours 06/01/19 2221 06/02/19 0829   06/01/19 2300  vancomycin (VANCOCIN) 1,750 mg in sodium chloride 0.9 % 500 mL IVPB  Status:  Discontinued     1,750 mg 250 mL/hr over 120 Minutes Intravenous Every 24 hours 06/01/19 2221 06/01/19 2223   06/01/19 2300  vancomycin (VANCOCIN) 2,500 mg in sodium chloride 0.9 % 500 mL IVPB     2,500 mg 250 mL/hr over 120 Minutes Intravenous  Once 06/01/19 2222 06/02/19 0720   06/01/19 2015  ceFEPIme (MAXIPIME) 2 g in sodium chloride 0.9 % 100 mL IVPB     2 g 200 mL/hr over 30 Minutes Intravenous  Once 06/01/19 2008 06/01/19 2054      REVIEW OF SYSTEMS:  Const: negative fever, negative chills, negative weight loss Eyes: negative diplopia or visual changes, negative eye pain ENT: negative coryza, negative sore throat Resp: negative cough, hemoptysis, dyspnea Cards: negative for chest pain, palpitations, lower extremity edema GU: negative for frequency, dysuria and hematuria GI: Negative for abdominal pain, diarrhea, bleeding, constipation Skin: negative for rash and pruritus Heme: negative for easy bruising and gum/nose bleeding MS: negative for myalgias, arthralgias, back pain and muscle weakness Neurolo:negative for headaches, dizziness, vertigo, memory problems  Psych: negative for feelings of anxiety, depression  Endocrine: negative for thyroid, diabetes Allergy/Immunology- negative for any medication or food allergies ?  Objective:  VITALS:  BP (!) 102/44   Pulse 73   Temp 99.2 F (37.3 C) (Oral)   Resp (!)  22   Ht 5\' 4"  (1.626 m)   Wt (!) 193.3 kg   SpO2 98%   BMI 73.14 kg/m  PHYSICAL EXAM:  General: Alert, cooperative, no distress, BMI 73 Head: Normocephalic, without obvious abnormality, atraumatic. Eyes: Conjunctivae clear, anicteric sclerae. Pupils are equal ENT Nares  normal. No drainage or sinus tenderness. Lips, mucosa, and tongue normal. No Thrush Neck: Supple, symmetrical, no adenopathy, thyroid: non tender no carotid bruit and no JVD. Back: did not examine Lungs: b/l air entry Heart: Regular rate and rhythm, no murmur, rub or gallop. Abdomen: Soft, non-tender,not distended. Bowel sounds normal. No masses Extremities: severe lymphedema legs  Rt more swollen than left, tender Verrucous lesions  Skin: No rashes or lesions. Or bruising Lymph: Cervical, supraclavicular normal. Neurologic: Grossly non-focal Pertinent Labs Lab Results CBC    Component Value Date/Time   WBC 4.3 06/02/2019 0451   RBC 4.22 06/02/2019 0451   HGB 11.0 (L) 06/02/2019 0451   HGB 12.8 09/28/2013 1838   HCT 34.8 (L) 06/02/2019 0451   HCT 39.8 09/28/2013 1838   PLT 160 06/02/2019 0451   PLT 222 09/28/2013 1838   MCV 82.5 06/02/2019 0451   MCV 81 09/28/2013 1838   MCH 26.1 06/02/2019 0451   MCHC 31.6 06/02/2019 0451   RDW 14.7 06/02/2019 0451   RDW 15.5 (H) 09/28/2013 1838   LYMPHSABS 1.3 04/10/2019 1044   LYMPHSABS 2.1 09/28/2013 1838   MONOABS 0.6 04/10/2019 1044   MONOABS 0.8 09/28/2013 1838   EOSABS 0.0 04/10/2019 1044   EOSABS 0.0 09/28/2013 1838   BASOSABS 0.0 04/10/2019 1044   BASOSABS 0.1 09/28/2013 1838    CMP Latest Ref Rng & Units 06/02/2019 06/01/2019 04/14/2019  Glucose 70 - 99 mg/dL 111(H) 79 -  BUN 8 - 23 mg/dL 15 17 -  Creatinine 0.44 - 1.00 mg/dL 0.94 0.95 0.82  Sodium 135 - 145 mmol/L 140 141 -  Potassium 3.5 - 5.1 mmol/L 3.9 3.9 -  Chloride 98 - 111 mmol/L 104 102 -  CO2 22 - 32 mmol/L 29 27 -  Calcium 8.9 - 10.3 mg/dL 8.6(L) 9.0 -  Total Protein 6.5 - 8.1 g/dL -  - -  Total Bilirubin 0.3 - 1.2 mg/dL - - -  Alkaline Phos 38 - 126 U/L - - -  AST 15 - 41 U/L - - -  ALT 0 - 44 U/L - - -      Microbiology: Recent Results (from the past 240 hour(s))  SARS CORONAVIRUS 2 (TAT 6-24 HRS) Nasopharyngeal Nasopharyngeal Swab     Status: None   Collection Time: 06/01/19  8:48 PM   Specimen: Nasopharyngeal Swab  Result Value Ref Range Status   SARS Coronavirus 2 NEGATIVE NEGATIVE Final    Comment: (NOTE) SARS-CoV-2 target nucleic acids are NOT DETECTED. The SARS-CoV-2 RNA is generally detectable in upper and lower respiratory specimens during the acute phase of infection. Negative results do not preclude SARS-CoV-2 infection, do not rule out co-infections with other pathogens, and should not be used as the sole basis for treatment or other patient management decisions. Negative results must be combined with clinical observations, patient history, and epidemiological information. The expected result is Negative. Fact Sheet for Patients: SugarRoll.be Fact Sheet for Healthcare Providers: https://www.woods-mathews.com/ This test is not yet approved or cleared by the [place] FDA and  has been authorized for detection and/or diagnosis of SARS-CoV-2 by FDA under an Emergency Use Authorization (EUA). This EUA will remain  in effect (meaning this test can be used) for the duration of the COVID-19 declaration under Section 56 4(b)(1) of the Act, 21 U.S.C. section 360bbb-3(b)(1), unless the authorization is terminated or revoked sooner. Performed at [facility], [place] [address], [place], [place] [address]     IMAGING RESULTS: I have personally  reviewed the films ? Impression/Recommendation ? ?Severe lymphedema both legs Rt> left Because of her noting more pain, redness and swelling in the rt leg will treat as cellulitis common organism is strep VS staph No reason to suspect MRSA or gram neg - will  not do any culture as her skin could be colonized with bacteria. Cefazolin IV  Keep leg elevated May consider chronic suppressive therapy with Keflex ( 500mg  PO BID) ( after full dose treatment for 5 days) to prevent recurrence and readmission along with probiotic Seen by vascular surgeon- for lymphclinic  OSA-CPAP  HTN on atenolol     ? ___________________________________________________ Discussed with patient, requesting provider Note:  This document was prepared using Dragon voice recognition software and may include unintentional dictation errors.

## 2019-06-02 NOTE — Progress Notes (Signed)
PROGRESS NOTE    [name]  [id number] DOB: 1955-12-31 DOA: 06/01/2019 PCP: Ranae Plumber, PA      Brief Narrative:  [name] is a 63 y.o. F with MO c/b severe chronic lymphedema, OSA on CPAP, and hypertension who presents with increased right leg pain and swelling, now with redness and drainage, suspicion for infection from wound clinic.  Patient was admitted to the hospital about a month ago for increased redness and swelling of her leg with drainage, that time surface culture grew Pseudomonas she was treated with IV antibiotics, and discharged.  She has been followed by wound clinic as an outpatient, and on the day of admission they felt this was infected and sent her to the ER.  In the ER she was afebrile, WBC 4K.  She was started on vancomycin and cefepime in the hospital service were asked to evaluate for cellulitis.        Assessment & Plan:  Right leg swelling Possible cellulitis Chronic lymphedema This appears to me just a flareup of her chronic lymphedema.  Right lower extremity venous Doppler ultrasound was obtained in the ER on admission and was negative for DVT.  -Stop vancomycin, there is no significant purulent component -Narrow to cefazolin -Consult infectious disease for second opinion regarding infection, and if they believe the infection is likely, to tailor antibiotic selection and duration -Consult vascular surgery regarding mechanical treatment of severe lymphedema -PT eval   Hypertension Blood pressure better this morning -Continue atenolol  Chronic normocytic anemia Stable relative to baseline  OSA -CPAP at night  BMI 73     MDM and disposition: The below labs and imaging reports were reviewed and summarized above.  Medication management as above.  The patient was admitted with right leg swelling.  There is concern this is infection amenable only to IV antibiotics.  We will get a second opinion, vascular vascular  surgery to evaluate as well.  From my preliminary discussion with ID and VVS, we will plan to narrow ABx.  If we are able to obtain consultations and no further work up is planned, can continue on IV antibiotics one more day, then transition to oral antibiotics and discharge tomorrow.    This was a close interval readmission, and I feel it would be unnecessary risk to discharge the patient today prematurely.     DVT prophylaxis: Lovenox Code Status: Full Family Communication:     Consultants:   Infectious disease  Vascular surgery  Procedures:   11/23 ultrasound right lower extremity -- no acute disease  11/23 right knee radiograph -- no acute disease  Antimicrobials:   Vancomycin x1  Cefepime 11/23>>   Subjective: Patient's left leg now hurts, mostly because of the position she is sitting in.  She has had no fever, chills, vomiting, purulent discharge.  She has some upper airway wheezing, runny nose.  Objective: Vitals:   06/01/19 1855 06/01/19 2258 06/02/19 0024 06/02/19 0100  BP: (!) 141/55 (!) 153/70 133/82   Pulse: 80 87 89   Resp: 20 (!) 24 19   Temp:  98.3 F (36.8 C) 98 F (36.7 C)   TempSrc:  Oral Oral   SpO2: 100% 100% 99%   Weight:    (!) 193.3 kg  Height:    5\' 4"  (1.626 m)    Intake/Output Summary (Last 24 hours) at 06/02/2019 0829 Last data filed at 06/02/2019 0655 Gross per 24 hour  Intake -  Output 150 ml  Net -150 ml  Filed Weights   06/01/19 1541 06/02/19 0100  Weight: (!) 149.7 kg (!) 193.3 kg    Examination: General appearance:  adult female, alert and in no acute distress.   HEENT: Anicteric, conjunctiva pink, lids and lashes normal. No nasal deformity, discharge, epistaxis.  Lips moist.   Skin: Warm and dry.  Chronic brawny changes of the bilateral lower extremities, bilateral severe lymphedema.  Redness in both lower extremities.  No weeping or drainage that I can see, although she is unable to lift her legs for me to see the  dependent portions. Cardiac: RRR, nl S1-S2, no murmurs appreciated.  Capillary refill is brisk.  JVP not visible.   Respiratory: Normal respiratory rate and rhythm.  CTAB without rales or wheezes.  Breath sounds distant. Abdomen: Abdomen soft.  No TTP or guarding. No ascites, distension, hepatosplenomegaly.   MSK: No deformities or effusions. Neuro: Awake and alert.  EOMI, moves upper extremities, difficulty moving lower extremities due to habitus. Speech fluent.    Psych: Sensorium intact and responding to questions, attention normal. Affect normal.  Judgment and insight appear normal.    Data Reviewed: I have personally reviewed following labs and imaging studies:  CBC: Recent Labs  Lab 06/01/19 1825 06/02/19 0451  WBC 5.8 4.3  HGB 11.4* 11.0*  HCT 36.4 34.8*  MCV 82.2 82.5  PLT 172 0000000   Basic Metabolic Panel: Recent Labs  Lab 06/01/19 1825 06/02/19 0451  NA 141 140  K 3.9 3.9  CL 102 104  CO2 27 29  GLUCOSE 79 111*  BUN 17 15  CREATININE 0.95 0.94  CALCIUM 9.0 8.6*   GFR: Estimated Creatinine Clearance: 106.5 mL/min (by C-G formula based on SCr of 0.94 mg/dL). Liver Function Tests: No results for input(s): AST, ALT, ALKPHOS, BILITOT, PROT, ALBUMIN in the last 168 hours. No results for input(s): LIPASE, AMYLASE in the last 168 hours. No results for input(s): AMMONIA in the last 168 hours. Coagulation Profile: No results for input(s): INR, PROTIME in the last 168 hours. Cardiac Enzymes: No results for input(s): CKTOTAL, CKMB, CKMBINDEX, TROPONINI in the last 168 hours. BNP (last 3 results) No results for input(s): PROBNP in the last 8760 hours. HbA1C: No results for input(s): HGBA1C in the last 72 hours. CBG: No results for input(s): GLUCAP in the last 168 hours. Lipid Profile: No results for input(s): CHOL, HDL, LDLCALC, TRIG, CHOLHDL, LDLDIRECT in the last 72 hours. Thyroid Function Tests: No results for input(s): TSH, T4TOTAL, FREET4, T3FREE, THYROIDAB in  the last 72 hours. Anemia Panel: No results for input(s): VITAMINB12, FOLATE, FERRITIN, TIBC, IRON, RETICCTPCT in the last 72 hours. Urine analysis:    Component Value Date/Time   COLORURINE YELLOW (A) 03/25/2019 1253   APPEARANCEUR CLEAR (A) 03/25/2019 1253   APPEARANCEUR Clear 05/18/2013 1725   LABSPEC 1.028 03/25/2019 1253   LABSPEC 1.011 05/18/2013 1725   PHURINE 5.0 03/25/2019 1253   GLUCOSEU NEGATIVE 03/25/2019 1253   GLUCOSEU Negative 05/18/2013 1725   HGBUR NEGATIVE 03/25/2019 [place] 03/25/2019 1253   BILIRUBINUR Negative 05/18/2013 [place] 03/25/2019 1253   PROTEINUR NEGATIVE 03/25/2019 1253   NITRITE NEGATIVE 03/25/2019 1253   LEUKOCYTESUR NEGATIVE 03/25/2019 1253   LEUKOCYTESUR Negative 05/18/2013 1725   Sepsis Labs: @LABRCNTIP (procalcitonin:4,lacticacidven:4)  ) Recent Results (from the past 240 hour(s))  SARS CORONAVIRUS 2 (TAT 6-24 HRS) Nasopharyngeal Nasopharyngeal Swab     Status: None   Collection Time: 06/01/19  8:48 PM   Specimen: Nasopharyngeal Swab  Result Value Ref Range Status   SARS Coronavirus 2 NEGATIVE NEGATIVE Final    Comment: (NOTE) SARS-CoV-2 target nucleic acids are NOT DETECTED. The SARS-CoV-2 RNA is generally detectable in upper and lower respiratory specimens during the acute phase of infection. Negative results do not preclude SARS-CoV-2 infection, do not rule out co-infections with other pathogens, and should not be used as the sole basis for treatment or other patient management decisions. Negative results must be combined with clinical observations, patient history, and epidemiological information. The expected result is Negative. Fact Sheet for Patients: SugarRoll.be Fact Sheet for Healthcare Providers: https://www.woods-mathews.com/ This test is not yet approved or cleared by the [place] FDA and  has been authorized for detection and/or  diagnosis of SARS-CoV-2 by FDA under an Emergency Use Authorization (EUA). This EUA will remain  in effect (meaning this test can be used) for the duration of the COVID-19 declaration under Section 56 4(b)(1) of the Act, 21 U.S.C. section 360bbb-3(b)(1), unless the authorization is terminated or revoked sooner. Performed at [facility], [place] [address], [place], [place] [address]          Radiology Studies: US Venous Img Lower Unilateral Right  Result Date: 06/01/2019 CLINICAL DATA:  Acute pain and swelling x2 weeks EXAM: RIGHT LOWER EXTREMITY VENOUS DOPPLER ULTRASOUND TECHNIQUE: Gray-scale sonography with compression, as well as color and duplex ultrasound, were performed to evaluate the deep venous system from the level of the common femoral vein through the popliteal and proximal calf veins. Technologist describes technically difficult study secondary to body habitus and edema. COMPARISON:  04/10/2019 FINDINGS: Normal compressibility of the common femoral, superficial femoral, and popliteal veins, as well as the proximal calf veins. No filling defects to suggest DVT on grayscale or color Doppler imaging. Doppler waveforms show normal direction of venous flow, normal respiratory phasicity and response to augmentation. Subcutaneous calf edema. Survey views of the contralateral common femoral vein are unremarkable. IMPRESSION: No femoropopliteal and no calf DVT in the visualized calf veins. If clinical symptoms are inconsistent or if there are persistent or worsening symptoms, further imaging (possibly involving the iliac veins) may be warranted. Electronically Signed   By: Lucrezia Europe M.D.   On: 06/01/2019 17:02   Dg Knee Complete 4 Views Right  Result Date: 06/01/2019 CLINICAL DATA:  Right knee pain EXAM: RIGHT KNEE - COMPLETE 4+ VIEW COMPARISON:  None. FINDINGS: Bone detail limited due to morbid obesity. Negative for fracture. Severe degenerative change in the lateral compartment.  Moderate degenerative change in spurring medially. Patellofemoral degenerative change. IMPRESSION: Negative for fracture.  Exam limited by obesity. Electronically Signed   By: Franchot Gallo M.D.   On: 06/01/2019 18:41        Scheduled Meds: . atenolol  50 mg Oral Daily  . enoxaparin (LOVENOX) injection  40 mg Subcutaneous Q12H   Continuous Infusions: .  ceFAZolin (ANCEF) IV       LOS: 1 day    Time spent: 25 minutes    Edwin Dada, MD [facility] 06/02/2019, 8:29 AM     Please page though [place] or Epic secure chat:  For [employer], Adult nurse

## 2019-06-02 NOTE — Evaluation (Signed)
Physical Therapy Evaluation Patient Details Name: [name] MRN: [id number] DOB: November 06, 1955 Today's Date: 06/02/2019   History of Present Illness  [name] is a 63 y.o. F with MO c/b severe chronic lymphedema, OSA on CPAP, and hypertension who presents with increased right leg pain and swelling, now with redness and drainage, suspicion for infection from wound clinic.  Clinical Impression  Pt admitted with above diagnosis. Pt currently with functional limitations due to the deficits listed below (see "PT Problem List"). Upon entry, pt in bed, awake and agreeable to participate. The pt is alert and oriented x4, pleasant, conversational, and generally a great historian. Max-total +2 assist for bed mobility, supervision level transfers and AMB. Functional mobility assessment demonstrates increased effort/time requirements, poor tolerance, and need for physical assistance, whereas the patient performed these at a higher level of independence PTA. Pt has a chronic condition that warrants additional DME to allow for her to care for herself with greater independence- most notably a hospital bed, as well as a bariatric RW. Pt will benefit from skilled PT intervention to increase independence and safety with basic mobility in preparation for discharge to the venue listed below.       Follow Up Recommendations Home health PT;Supervision for mobility/OOB    Equipment Recommendations  Hospital bed;Other (comment)(Bariatric RW)    Recommendations for Other Services       Precautions / Restrictions Precautions Precautions: Fall Precaution Comments: BLE swelling and wounds. Restrictions Weight Bearing Restrictions: No      Mobility  Bed Mobility Overal bed mobility: Needs Assistance Bed Mobility: Supine to Sit;Sit to Supine     Supine to sit: +2 for safety/equipment;+2 for physical assistance;Max assist Sit to supine: +2 for safety/equipment;+2 for physical assistance;Max assist   General bed mobility comments: Severe BLE Lymphedea with A/C swelling  Transfers Overall transfer level: Needs assistance Equipment used: None Transfers: Sit to/from Stand Sit to Stand: Supervision         General transfer comment: multipel times in room from chair or EOB; pt moves well generally, uses QC or RW once up for balance(pt familiar with self assist useing bed sheet, but unable to apply sheet herself. Pt also limited by pain and girth of legs, requires maximal effort for back into bed.)  Ambulation/Gait   Gait Distance (Feet): 32 Feet(19ft; seated rest, then 32 feet again, more limited by leg pain/instability) Assistive device: Rolling walker (2 wheeled);Lofstrands;Quad cane   Gait velocity: Pt has Right knee giving way after 62ft during second bout. Gait velocity interpretation: <1.31 ft/sec, indicative of household ambulator General Gait Details: demonstrates a 3-point QC gait, then allowed to attempt a BRW gait which is not ideal as her BLE habitus requires significant lateral postural sway for forward progression which inhibits ability to remain within RW. Pt then attempts 1 QC and 1 lostrand, and then 2 QC.  Stairs            Wheelchair Mobility    Modified Rankin (Stroke Patients Only)       Balance Overall balance assessment: Modified Independent;Mild deficits observed, not formally tested                                           Pertinent Vitals/Pain Pain Assessment: Faces Pain Score: 1  Pain Location: posterior knees to posterio thighs Pain Descriptors / Indicators: Burning Pain Intervention(s): Limited activity within patient's  tolerance;Monitored during session;Repositioned    Home Living Family/patient expects to be discharged to:: Private residence Living Arrangements: Children(39yoSon) Available Help at Discharge: Family(Son woriks a FT and PT job;) Type of Home: House Home Access: Stairs to enter   [employer] of Steps: 1 step into kitchen Home Layout: One level Home Equipment: [place] - quad;Walker - standard;Bedside commode(her mom's old RW, likely not weight appropraite) Additional Comments: has a toilet riser with bars at toilet and in bathrub; bathes self in sink; sleeps in recliner for stretches when is weaker    Prior Function Level of Independence: Independent with assistive device(s)         Comments: pt does not drive, family assists with errands/picking up groceries. Takes medical transportation to appointments as her legs swell.     Hand Dominance   Dominant Hand: Right    Extremity/Trunk Assessment   Upper Extremity Assessment Upper Extremity Assessment: Generalized weakness    Lower Extremity Assessment Lower Extremity Assessment: Generalized weakness    Cervical / Trunk Assessment Cervical / Trunk Assessment: Other exceptions Cervical / Trunk Exceptions: MO  Communication   Communication: No difficulties  Cognition Arousal/Alertness: Awake/alert Behavior During Therapy: WFL for tasks assessed/performed Overall Cognitive Status: Within Functional Limits for tasks assessed                                        General Comments      Exercises     Assessment/Plan    PT Assessment Patient needs continued PT services  PT Problem List Decreased strength;Decreased range of motion;Decreased activity tolerance;Decreased balance;Decreased mobility       PT Treatment Interventions DME instruction;Gait training;Stair training;Functional mobility training;Therapeutic activities;Therapeutic exercise;Patient/family education    PT Goals (Current goals can be found in the Care Plan section)  Acute Rehab PT Goals Patient Stated Goal: regain independence with bed mobility and AMB PT Goal Formulation: With patient Time For Goal Achievement: 06/16/19 Potential to Achieve Goals: Good    Frequency Min 2X/week   Barriers to discharge  Inaccessible home environment;Decreased caregiver support      Co-evaluation               AM-PAC PT "6 Clicks" Mobility  Outcome Measure Help needed turning from your back to your side while in a flat bed without using bedrails?: Total Help needed moving from lying on your back to sitting on the side of a flat bed without using bedrails?: Total Help needed moving to and from a bed to a chair (including a wheelchair)?: A Little Help needed standing up from a chair using your arms (e.g., wheelchair or bedside chair)?: A Little Help needed to walk in hospital room?: A Little Help needed climbing 3-5 steps with a railing? : Total 6 Click Score: 12    End of Session   Activity Tolerance: Patient limited by fatigue;Patient limited by pain Patient left: in bed;with call bell/phone within reach   PT Visit Diagnosis: Unsteadiness on feet (R26.81);Other abnormalities of gait and mobility (R26.89)    Time: [id number] PT Time Calculation (min) (ACUTE ONLY): 52 min   Charges:   PT Evaluation $PT Eval Moderate Complexity: 1 Mod PT Treatments $Gait Training: 8-22 mins $Therapeutic Exercise: 8-22 mins        3:41 PM, 06/02/19 Etta Grandchild, PT, DPT Physical Therapist - [facility]  [phone] [facility])   Rebbeca Paul  C 06/02/2019, 3:38 PM

## 2019-06-02 NOTE — Progress Notes (Signed)
Pt complained of itching as soon as her vancomycin started running. She stated that she has reaction to vanc before, she developed Red Man Syndrome. Kirby-NP made aware via epic text. Order for benadryl and to restart vanc in a slow rate.

## 2019-06-02 NOTE — Consult Note (Signed)
[facility] Vascular Consult Note  MRN : [id number]  [name] is a 63 y.o. (Sep 10, 1955) female who presents with chief complaint of  Chief Complaint  Patient presents with  . Leg Swelling  .  History of Present Illness:  I am asked to evaluate the patient by Dr. Loleta Books.  The patient is a 63 year old woman who is known to our practice.  She has been evaluated for advanced lymphedema in the past.  She has an extensive history of repetitive infections as well as repetitive ulcerations.  She notes she was in the hospital back in October for similar symptoms.  Following discharge she felt that she never really did get back to baseline.  Since then her legs have been swelling and she has been experiencing increased problems to the point that she returned to the emergency room yesterday with worsening pain swelling redness.  The patient denies fever chills.  She has been struggling with several wounds but feels that these have been improving she has been following with the wound clinic.  On questioning she has not been wearing graduated compression nor has she been using her lymph pump at all.  There is also some question as to whether she has been elevating.  Current Facility-Administered Medications  Medication Dose Route Frequency Provider Last Rate Last Dose  . acetaminophen (TYLENOL) tablet 325-650 mg  325-650 mg Oral Q6H PRN Tu, Ching T, DO   650 mg at 06/02/19 0932  . atenolol (TENORMIN) tablet 50 mg  50 mg Oral Daily Tu, Ching T, DO   50 mg at 06/02/19 0932  . ceFAZolin (ANCEF) IVPB 2g/100 mL premix  2 g Intravenous Q8H Danford, Christopher P, MD      . diphenhydrAMINE (BENADRYL) capsule 25 mg  25 mg Oral Q8H PRN Gardiner Barefoot, NP   25 mg at 06/02/19 0155  . enoxaparin (LOVENOX) injection 40 mg  40 mg Subcutaneous Q12H Tu, Ching T, DO   40 mg at 06/02/19 0932  . polyethylene glycol (MIRALAX / GLYCOLAX) packet 17 g  17 g Oral Daily PRN Orene Desanctis, DO        Past Medical History:  Diagnosis Date  . Arthritis   . Asthma   . Enlarged heart   . Hypertension   . Lymphedema   . Sleep apnea   . Spinal stenosis     Past Surgical History:  Procedure Laterality Date  . COLONOSCOPY WITH PROPOFOL N/A 03/12/2018   Procedure: COLONOSCOPY WITH PROPOFOL;  Surgeon: Manya Silvas, MD;  Location: [facility] ENDOSCOPY;  Service: Endoscopy;  Laterality: N/A;  . NO PAST SURGERIES      Social History Social History   Tobacco Use  . Smoking status: Former Research scientist (life sciences)  . Smokeless tobacco: Current User    Types: Snuff  Substance Use Topics  . Alcohol use: No  . Drug use: No    Family History Family History  Problem Relation Age of Onset  . Thyroid disease Other   . Breast cancer Maternal Aunt   No family history of bleeding/clotting disorders, porphyria or autoimmune disease   Allergies  Allergen Reactions  . Ace Inhibitors Hives and Swelling  . Ibuprofen Hives and Swelling     REVIEW OF SYSTEMS (Negative unless checked)  Constitutional: [] Weight loss  [] Fever  [] Chills Cardiac: [] Chest pain   [] Chest pressure   [] Palpitations   [] Shortness of breath when laying flat   [] Shortness of breath at rest   [x] Shortness  of breath with exertion. Vascular:  [] Pain in legs with walking   [x] Pain in legs at rest   [] Pain in legs when laying flat   [] Claudication   [] Pain in feet when walking  [] Pain in feet at rest  [] Pain in feet when laying flat   [] History of DVT   [] Phlebitis   [x] Swelling in legs   [] Varicose veins   [] Non-healing ulcers Pulmonary:   [] Uses home oxygen   [] Productive cough   [] Hemoptysis   [] Wheeze  [] COPD   [] Asthma Neurologic:  [] Dizziness  [] Blackouts   [] Seizures   [] History of stroke   [] History of TIA  [] Aphasia   [] Temporary blindness   [] Dysphagia   [] Weakness or numbness in arms   [] Weakness or numbness in legs Musculoskeletal:  [] Arthritis   [] Joint swelling   [] Joint pain   [] Low back pain Hematologic:  [] Easy  bruising  [] Easy bleeding   [] Hypercoagulable state   [] Anemic  [] Hepatitis Gastrointestinal:  [] Blood in stool   [] Vomiting blood  [] Gastroesophageal reflux/heartburn   [] Difficulty swallowing. Genitourinary:  [] Chronic kidney disease   [] Difficult urination  [] Frequent urination  [] Burning with urination   [] Blood in urine Skin:  [] Rashes   [] Ulcers   [] Wounds Psychological:  [] History of anxiety   []  History of major depression.    Physical Examination  Vitals:   06/02/19 0900 06/02/19 1430 06/02/19 1624 06/02/19 1706  BP: (!) 116/57  (!) 90/46 (!) 102/44  Pulse: 98  74 73  Resp: (!) 24  (!) 24 (!) 22  Temp: 98.9 F (37.2 C)  99.2 F (37.3 C)   TempSrc: Oral  Oral   SpO2: 96% 95% 98%   Weight:      Height:       Body mass index is 73.14 kg/m.  General: The patient is in bed she is morbidly obese her feet are in a rather marked dependent position Head: [place]/AT, No temporalis wasting.  Ear/Nose/Throat: Nares w/o erythema or drainage, oropharynx w/o obsrtuction,  Eyes: PERRLA, Sclera nonicteric.  Neck: Normal rotation.  No JVD appreciated.  Pulmonary: No audible wheezing, no use of accessory muscles.  Cardiac: RRR, precordium is not hyperdynamic vascular: There is massive lymphedema noted bilaterally with extensive cobblestoning and skin changes.  Is hard to ascertain for sure whether she does have open wounds I do not see evidence of drainage or crusting but the creases and folds are quite firm and relatively deep making identifying a break in the skin virtually impossible.  There is diffuse fiery red erythema with warmth. Gastrointestinal: nonrigid, non-distended.  Musculoskeletal: Moves all extremities.  No deformity or atrophy. No edema. Neurologic: CN 2-12 intact. Symmetrical.  Speech is fluent.  Psychiatric: Judgment intact, Mood & affect appropriate for pt's clinical situation. Dermatologic: Marked venous rashes and skin changes consistent with chronic advanced lymphedema  uncertain ulcers noted.  Positive cellulitis likely with open wounds. Lymph : Massive lymphedema with cobblestoning of the skin.      CBC Lab Results  Component Value Date   WBC 4.3 06/02/2019   HGB 11.0 (L) 06/02/2019   HCT 34.8 (L) 06/02/2019   MCV 82.5 06/02/2019   PLT 160 06/02/2019    BMET    Component Value Date/Time   NA 140 06/02/2019 0451   NA 136 09/30/2013 0407   K 3.9 06/02/2019 0451   K 4.1 09/30/2013 0407   CL 104 06/02/2019 0451   CL 102 09/30/2013 0407   CO2 29 06/02/2019 0451   CO2 28  09/30/2013 0407   GLUCOSE 111 (H) 06/02/2019 0451   GLUCOSE 95 09/30/2013 0407   BUN 15 06/02/2019 0451   BUN 14 09/30/2013 0407   CREATININE 0.94 06/02/2019 0451   CREATININE 0.95 09/30/2013 0407   CALCIUM 8.6 (L) 06/02/2019 0451   CALCIUM 8.4 (L) 09/30/2013 0407   GFRNONAA >60 06/02/2019 0451   GFRNONAA >60 09/30/2013 0407   GFRAA >60 06/02/2019 0451   GFRAA >60 09/30/2013 0407   Estimated Creatinine Clearance: 106.5 mL/min (by C-G formula based on SCr of 0.94 mg/dL).  COAG Lab Results  Component Value Date   INR 1.10 04/17/2018    Radiology Duplex ultrasound of the right lower extremity obtained 05/01/2019 is reviewed by me.  Visualized veins appear to be compressible and free of heterogeneous material suggesting patency there is no evidence of DVT.  There is diffuse soft tissue edema identified  Assessment/Plan 1.  Profound lymphedema with chronic indolent infection and recurrent ulcerations:  I do believe the patient requires treatment for cellulitis involving her lower extremities and fully support Dr. Raelene Bott recommendations.  Unfortunately, the primary methods for controlling her lymphedema are as follows: 1.  Well fitted graduated compression (in her case given the massive size of her legs with the multiple folds and deformities compression wraps would be needed as I do not believe even custom socks or stockings would fit properly). 2.  Elevation  which even here at the hospital she is falling short of granted this can be difficult especially given her weight and obesity however dependency will have a tremendous negative impact on her wounds, ability to clear an indolent infection and overall size and symptoms of her leg. 3.  Lymph pump which should be used at least 3 times a day for a minimum of 1 hour each time.  There is some question as to whether the new lymph pump she has received is well fitted for her she was much happier with the original pump we could certainly try to recreate that scenario for her.  Ultimately, this is crucial for her wellbeing. 4.  Lymph clinic and possible lymphatic massage.  I believe this would be extremely helpful for her however given the chronicity of her disease there is significant scarring and fibrosis of her subcutaneous tissues which will certainly make lymphatic massage much more difficult and somewhat less effective nevertheless, this could still be an integral and extremely helpful adjunct.  I will plan for her to follow-up with me in the office to see if we can move forward with compression wraps and recreating a more functional lymph pump for her.  I am certainly happy to give her a referral to the lymph clinic after discharge which she says she will need.  Thank you for asking me to see [name] and allow me to participate in her care   Hortencia Pilar, MD  06/02/2019 5:53 PM

## 2019-06-02 NOTE — Progress Notes (Signed)
Anticoagulation monitoring(Lovenox):  63 yo female ordered Lovenox 40 mg Q24h  Filed Weights   06/01/19 1541  Weight: (!) 330 lb (149.7 kg)   BMI 57.5    Lab Results  Component Value Date   CREATININE 0.95 06/01/2019   CREATININE 0.82 04/14/2019   CREATININE 0.89 04/10/2019   Estimated Creatinine Clearance: 88 mL/min (by C-G formula based on SCr of 0.95 mg/dL). Hemoglobin & Hematocrit     Component Value Date/Time   HGB 11.4 (L) 06/01/2019 1825   HGB 12.8 09/28/2013 1838   HCT 36.4 06/01/2019 1825   HCT 39.8 09/28/2013 1838     Per Protocol for Patient with estCrcl > 30 ml/min and BMI > 40, will transition to Lovenox 40 mg Q12h.

## 2019-06-02 NOTE — Progress Notes (Signed)
   06/02/19 1100  Clinical Encounter Type  Visited With Patient  Visit Type Initial  Referral From Patient  Spiritual Encounters  Spiritual Needs Sacred text;Prayer;Emotional  Stress Factors  Patient Stress Factors Exhausted;Health changes  Ch was paged to provide emotional/spiritual support. Pt was feeling emotional and teary eyed. Pt shared that her hospital visits have become more frequent which might indicate an on-going decline in her health. She stays positive and strong through her faith yet feels the stress from dealing with her physical conditions. Her son is in denial to face her mother's situation and that frustrates the pt. Pt shared about the death of her brother and mother a few years ago. Ch asked her probing questions to let her release some of the angst and be able to express her concerns. Ch sensed resistance in the pt to not talk about negative feelings. Ch prayed with the pt for her healing and that she gets through this strong. Pt may benefit from additional support in being able to deal with and face her current situation and prognosis.

## 2019-06-03 LAB — BASIC METABOLIC PANEL
Anion gap: 8 (ref 5–15)
BUN: 19 mg/dL (ref 8–23)
CO2: 27 mmol/L (ref 22–32)
Calcium: 8 mg/dL — ABNORMAL LOW (ref 8.9–10.3)
Chloride: 104 mmol/L (ref 98–111)
Creatinine, Ser: 0.88 mg/dL (ref 0.44–1.00)
GFR calc Af Amer: >60 mL/min (ref >60)
GFR calc non Af Amer: >60 mL/min (ref >60)
Glucose, Bld: 103 mg/dL — ABNORMAL HIGH (ref 70–99)
Potassium: 4 mmol/L (ref 3.5–5.1)
Sodium: 139 mmol/L (ref 135–145)

## 2019-06-03 LAB — CBC
HCT: 31.2 % — ABNORMAL LOW (ref 36.0–46.0)
Hemoglobin: 9.3 g/dL — ABNORMAL LOW (ref 12.0–15.0)
MCH: 25.5 pg — ABNORMAL LOW (ref 26.0–34.0)
MCHC: 29.8 g/dL — ABNORMAL LOW (ref 30.0–36.0)
MCV: 85.7 fL (ref 80.0–100.0)
Platelets: 148 10*3/uL — ABNORMAL LOW (ref 150–400)
RBC: 3.64 MIL/uL — ABNORMAL LOW (ref 3.87–5.11)
RDW: 15 % (ref 11.5–15.5)
WBC: 5.2 10*3/uL (ref 4.0–10.5)
nRBC: 0 % (ref 0.0–0.2)

## 2019-06-03 MED ORDER — FUROSEMIDE 40 MG PO TABS
40.0000 mg | ORAL_TABLET | Freq: Every day | ORAL | Status: DC
  Administered 2019-06-03 – 2019-06-04 (×2): 40 mg via ORAL
  Filled 2019-06-03 (×2): qty 1

## 2019-06-03 NOTE — Progress Notes (Signed)
   06/03/19 1200  Clinical Encounter Type  Visited With Patient  Visit Type Follow-up  Spiritual Encounters  Spiritual Needs Prayer;Emotional  Ch followed up with the pt. Pt was in good mood getting ready for lunch. Ch brought a prayer shawl as a symbol of support and pt was delighted to receive. Ch and pt talked about pt's faith and God's protection and together praised the divine guidance. Pt gets tremendous spiritual strength from her faith and uses it as her coping mechanism. Ch encouraged the pt to stay close to her faith which helps with her resiliency. Ch will follow up tomorrow as pt grieves not being able to be with her Son on Thanksgiving.

## 2019-06-03 NOTE — Evaluation (Signed)
Occupational Therapy Evaluation Patient Details Name: [name] MRN: [id number] DOB: 1955-09-15 Today's Date: 06/03/2019    History of Present Illness [name] is a 63 y.o. F with MO c/b severe chronic lymphedema, OSA on CPAP, and hypertension who presents with increased right leg pain and swelling, now with redness and drainage, suspicion for infection from wound clinic.   Clinical Impression   [name] was seen for OT evaluation this date. Pt received supine in bed urgently requesting bedpan this am. OT and unit Charge RN assist pt with bed level toileting. Prior to hospital admission, pt was independent with ADL management. She reports that she sponge bathes at baseline due to her limited ability to lift BLE into her tub shower. Pt independent for dressing, UB ADL and meal prep. Receives assistance from family members for driving and grocery shopping. Pt endorses that she lives with her adult son who works long hours. Pt is pleasant and conversational throughout OT session. She is eager to regain functional independence and improve her satisfaction with daily routines. Currently pt demonstrates impairments in BLE strength, pain management, and edema management requiring +2 moderate/max assist for bed mobility as well as at least moderate assist for LB ADL management. It is able to complete functional mobility once EOB at supervision level. Pt would benefit from skilled OT to address noted impairments and functional limitations (see below for any additional details) in order to maximize safety and independence while minimizing falls risk and caregiver burden.  Upon hospital discharge, recommend HHOT to maximize pt safety and return to functional independence during meaningful occupations of daily life.     Follow Up Recommendations  Home health OT    Equipment Recommendations  Tub/shower bench(Bari TTB)    Recommendations for Other Services       Precautions /  Restrictions Precautions Precautions: Fall Precaution Comments: BLE swelling and wounds. Restrictions Weight Bearing Restrictions: No      Mobility Bed Mobility Overal bed mobility: Needs Assistance Bed Mobility: Rolling;Supine to Sit Rolling: +2 for physical assistance;Mod assist;Max assist   Supine to sit: +2 for safety/equipment;+2 for physical assistance;Max assist     General bed mobility comments: Severe BLE Lymphedea with A/C swelling  Transfers Overall transfer level: Needs assistance   Transfers: Sit to/from Stand Sit to Stand: Supervision              Balance Overall balance assessment: Modified Independent;Mild deficits observed, not formally tested                                         ADL either performed or assessed with clinical judgement   ADL Overall ADL's : Needs assistance/impaired Eating/Feeding: Independent;Sitting   Grooming: Sitting;Supervision/safety;Set up   Upper Body Bathing: Sitting;Minimal assistance;With adaptive equipment   Lower Body Bathing: Sitting/lateral leans;Moderate assistance;With adaptive equipment Lower Body Bathing Details (indicate cue type and reason): Pt req moderate assist for LB bathing tasks including assistance with tub transfer and reacing feet/lower legs with bathing.     Lower Body Dressing: Moderate assistance;Sit to/from stand   Toilet Transfer: Minimal assistance;Set up;BSC;RW;Ambulation   Toileting- Clothing Manipulation and Hygiene: Sit to/from stand;Minimal assistance;Moderate assistance Toileting - Clothing Manipulation Details (indicate cue type and reason): Pt required use of bedpan this date 2/2 urgent need for BM. Pt requires +2 moderate assist to roll in bed with total assist for peri-care. Pt could ambulate to  BSC given increased time/effort to perform.             Vision Baseline Vision/History: Wears glasses Wears Glasses: At all times Patient Visual Report: No change  from baseline       Perception     Praxis      Pertinent Vitals/Pain Faces Pain Scale: Hurts a little bit Pain Location: LLE with mobility, pt reports pain as improved from past date. Pain Descriptors / Indicators: Burning;Grimacing;Guarding Pain Intervention(s): Limited activity within patient's tolerance;Monitored during session;Repositioned     Hand Dominance Right   Extremity/Trunk Assessment Upper Extremity Assessment Upper Extremity Assessment: Generalized weakness   Lower Extremity Assessment Lower Extremity Assessment: Generalized weakness       Communication Communication Communication: No difficulties   Cognition Arousal/Alertness: Awake/alert Behavior During Therapy: WFL for tasks assessed/performed Overall Cognitive Status: Within Functional Limits for tasks assessed                                 General Comments: Pt pleasant and conversational throughout OT session.   General Comments       Exercises Other Exercises Other Exercises: Pt educated in safe use of AE/DME for ADL management as well as falls prevention strategies for home and hospital this date. Would benefit from further education in AE options to support independence and safty in the home.   Shoulder Instructions      Home Living Family/patient expects to be discharged to:: Private residence Living Arrangements: Children(39 y.o. son) Available Help at Discharge: Family(Won works FT and PT job)   Home Access: Stairs to enter Technical brewer of Steps: 1 step into Editor, commissioning: None Home Layout: One level     Bathroom Shower/Tub: Teacher, early years/pre: Standard(With riser w/handles)     Home Equipment: [name] - quad;Walker - standard;Bedside commode;Grab bars - tub/shower;Hand held shower head;Grab bars - toilet;Toilet riser          Prior Functioning/Environment Level of Independence: Independent with assistive device(s)         Comments: Pt is independent with BADL tasks at baseline. Requires assistance with driving, shopping, etc. Recieves medical transportaion for Dr's apts. Sleeps in recliner when her legs are more painful, but this is difficult as her legs do not fit well on foot rest. Sponge bathes lately because she has difficulty lifting her legs into her tubshower.        OT Problem List: Decreased strength;Decreased coordination;Pain;Decreased activity tolerance;Decreased safety awareness;Obesity;Increased edema;Impaired sensation;Impaired balance (sitting and/or standing)      OT Treatment/Interventions: Self-care/ADL training;Therapeutic exercise;Therapeutic activities;DME and/or AE instruction;Balance training;Energy conservation;Patient/family education;Modalities    OT Goals(Current goals can be found in the care plan section) Acute Rehab OT Goals Patient Stated Goal: regain independence with bed mobility and AMB OT Goal Formulation: With patient Time For Goal Achievement: 06/17/19 Potential to Achieve Goals: Good ADL Goals Pt Will Perform Lower Body Bathing: sitting/lateral leans;with adaptive equipment;with min assist(With LRAD PRN for improved safety and functional independence.) Pt Will Perform Lower Body Dressing: with adaptive equipment;sit to/from stand;with min assist(With LRAD PRN for improved safety and functional independence.) Pt Will Transfer to Toilet: ambulating;bedside commode;grab bars;with modified independence(With LRAD PRN for improved safety and functional independence.) Pt Will Perform Toileting - Clothing Manipulation and hygiene: with adaptive equipment;sit to/from stand;with min assist;with min guard assist(With LRAD PRN for improved safety and functional independence.)  OT Frequency: Min 1X/week   Barriers to  D/C: Decreased caregiver support          Co-evaluation              AM-PAC OT "6 Clicks" Daily Activity     Outcome Measure Help from another person eating  meals?: None Help from another person taking care of personal grooming?: None Help from another person toileting, which includes using toliet, bedpan, or urinal?: A Lot Help from another person bathing (including washing, rinsing, drying)?: A Lot Help from another person to put on and taking off regular upper body clothing?: A Little Help from another person to put on and taking off regular lower body clothing?: A Lot 6 Click Score: 17   End of Session    Activity Tolerance: Patient tolerated treatment well Patient left: in bed;with call bell/phone within reach;with bed alarm set;with nursing/sitter in room(With RN in room for AM meds)  OT Visit Diagnosis: Other abnormalities of gait and mobility (R26.89);Pain Pain - Right/Left: (Both) Pain - part of body: Knee;Leg;Ankle and joints of foot                Time: [id number] OT Time Calculation (min): 26 min Charges:  OT General Charges $OT Visit: 1 Visit OT Evaluation $OT Eval Moderate Complexity: 1 Mod OT Treatments $Self Care/Home Management : 8-22 mins  Shara Blazing, M.S., OTR/L Ascom: [phone] 06/03/19, 11:10 AM

## 2019-06-03 NOTE — Progress Notes (Signed)
Patient ID: [name], female   DOB: 1955-09-23, 63 y.o.   MRN: [id number] [facility] PROGRESS NOTE  [name] [id number] DOB: 04-04-1956 DOA: 06/01/2019 PCP: Ranae Plumber, PA  HPI/Subjective: Patient woke up today and feeling better than she has been.  She states her legs always have a little discoloration.  Her left leg had an ulcer since the last time she has had cellulitis.  Her right leg is more swollen and red and discomfort.  Objective: Vitals:   06/03/19 0519 06/03/19 0736  BP: 114/68 111/64  Pulse: 74 73  Resp: 20   Temp: (!) 97.1 F (36.2 C) 98.8 F (37.1 C)  SpO2: 97% 94%    Intake/Output Summary (Last 24 hours) at 06/03/2019 1322 Last data filed at 06/03/2019 0900 Gross per 24 hour  Intake 440 ml  Output 700 ml  Net -260 ml   Filed Weights   06/01/19 1541 06/02/19 0100  Weight: (!) 149.7 kg (!) 193.3 kg    ROS: Review of Systems  Constitutional: Negative for chills and fever.  Eyes: Negative for blurred vision.  Respiratory: Negative for cough and shortness of breath.   Cardiovascular: Negative for chest pain.  Gastrointestinal: Negative for abdominal pain, constipation, diarrhea, nausea and vomiting.  Genitourinary: Negative for dysuria.  Musculoskeletal: Positive for joint pain.  Neurological: Negative for dizziness and headaches.   Exam: Physical Exam  Constitutional: She is oriented to person, place, and time.  HENT:  Nose: No mucosal edema.  Mouth/Throat: No oropharyngeal exudate or posterior oropharyngeal edema.  Eyes: Pupils are equal, round, and reactive to light. Conjunctivae, EOM and lids are normal.  Neck: No JVD present. Carotid bruit is not present. No edema present. No thyroid mass and no thyromegaly present.  Cardiovascular: S1 normal and S2 normal. Exam reveals no gallop.  No murmur heard. Pulses:      Dorsalis pedis pulses are 2+ on the right side and 2+ on the left side.  Respiratory: No respiratory  distress. She has no wheezes. She has no rhonchi. She has no rales.  GI: Soft. Bowel sounds are normal. There is no abdominal tenderness.  Musculoskeletal:     Right knee: She exhibits swelling.     Left knee: She exhibits swelling.     Right ankle: She exhibits swelling.     Left ankle: She exhibits swelling.  Lymphadenopathy:    She has no cervical adenopathy.  Neurological: She is alert and oriented to person, place, and time. No cranial nerve deficit.  Skin: Skin is warm. Nails show no clubbing.  Left leg chronic lymphedema.  Slight erythema.  Small ulceration left shin.  No signs of infection around the ulcer. Right leg erythema and warmth.  Warmth and erythema in the right groin also.  Psychiatric: She has a normal mood and affect.      Data Reviewed: Basic Metabolic Panel: Recent Labs  Lab 06/01/19 1825 06/02/19 0451 06/03/19 0430  NA 141 140 139  K 3.9 3.9 4.0  CL 102 104 104  CO2 27 29 27   GLUCOSE 79 111* 103*  BUN 17 15 19   CREATININE 0.95 0.94 0.88  CALCIUM 9.0 8.6* 8.0*   CBC: Recent Labs  Lab 06/01/19 1825 06/02/19 0451 06/03/19 0430  WBC 5.8 4.3 5.2  HGB 11.4* 11.0* 9.3*  HCT 36.4 34.8* 31.2*  MCV 82.2 82.5 85.7  PLT 172 160 148*   BNP (last 3 results) Recent Labs    03/25/19 1305  BNP 76.0  Recent Results (from the past 240 hour(s))  SARS CORONAVIRUS 2 (TAT 6-24 HRS) Nasopharyngeal Nasopharyngeal Swab     Status: None   Collection Time: 06/01/19  8:48 PM   Specimen: Nasopharyngeal Swab  Result Value Ref Range Status   SARS Coronavirus 2 NEGATIVE NEGATIVE Final    Comment: (NOTE) SARS-CoV-2 target nucleic acids are NOT DETECTED. The SARS-CoV-2 RNA is generally detectable in upper and lower respiratory specimens during the acute phase of infection. Negative results do not preclude SARS-CoV-2 infection, do not rule out co-infections with other pathogens, and should not be used as the sole basis for treatment or other patient  management decisions. Negative results must be combined with clinical observations, patient history, and epidemiological information. The expected result is Negative. Fact Sheet for Patients: SugarRoll.be Fact Sheet for Healthcare Providers: https://www.woods-mathews.com/ This test is not yet approved or cleared by the [place] FDA and  has been authorized for detection and/or diagnosis of SARS-CoV-2 by FDA under an Emergency Use Authorization (EUA). This EUA will remain  in effect (meaning this test can be used) for the duration of the COVID-19 declaration under Section 56 4(b)(1) of the Act, 21 U.S.C. section 360bbb-3(b)(1), unless the authorization is terminated or revoked sooner. Performed at [facility], [place] [address] [place], [place] [address]      Studies: US Venous Img Lower Unilateral Right  Result Date: 06/01/2019 CLINICAL DATA:  Acute pain and swelling x2 weeks EXAM: RIGHT LOWER EXTREMITY VENOUS DOPPLER ULTRASOUND TECHNIQUE: Gray-scale sonography with compression, as well as color and duplex ultrasound, were performed to evaluate the deep venous system from the level of the common femoral vein through the popliteal and proximal calf veins. Technologist describes technically difficult study secondary to body habitus and edema. COMPARISON:  04/10/2019 FINDINGS: Normal compressibility of the common femoral, superficial femoral, and popliteal veins, as well as the proximal calf veins. No filling defects to suggest DVT on grayscale or color Doppler imaging. Doppler waveforms show normal direction of venous flow, normal respiratory phasicity and response to augmentation. Subcutaneous calf edema. Survey views of the contralateral common femoral vein are unremarkable. IMPRESSION: No femoropopliteal and no calf DVT in the visualized calf veins. If clinical symptoms are inconsistent or if there are persistent or worsening symptoms,  further imaging (possibly involving the iliac veins) may be warranted. Electronically Signed   By: Lucrezia Europe M.D.   On: 06/01/2019 17:02   Dg Knee Complete 4 Views Right  Result Date: 06/01/2019 CLINICAL DATA:  Right knee pain EXAM: RIGHT KNEE - COMPLETE 4+ VIEW COMPARISON:  None. FINDINGS: Bone detail limited due to morbid obesity. Negative for fracture. Severe degenerative change in the lateral compartment. Moderate degenerative change in spurring medially. Patellofemoral degenerative change. IMPRESSION: Negative for fracture.  Exam limited by obesity. Electronically Signed   By: Franchot Gallo M.D.   On: 06/01/2019 18:41    Scheduled Meds: . acidophilus  1 capsule Oral Daily  . atenolol  50 mg Oral Daily  . enoxaparin (LOVENOX) injection  40 mg Subcutaneous Q12H  . furosemide  40 mg Oral Daily   Continuous Infusions: .  ceFAZolin (ANCEF) IV 2 g (06/03/19 0910)    Assessment/Plan:  1. Right lower extremity cellulitis on top of chronic lymphedema bilateral lower extremities.  IV Ancef.  Start low-dose Lasix. 2. Chronic lower extremity lymphedema.  Asked social worker to look into lymphedema clinics for the patient. 3. Essential hypertension on atenolol. 4. Normocytic anemia.  Send off a ferritin and  a B12. 5. Morbid obesity with a BMI of 73.14.,  Sleep apnea and hypertension are related.  Continue CPAP at night.  Code Status:     Code Status Orders  (From admission, onward)         Start     Ordered   06/01/19 2153  Full code  Continuous     06/01/19 2152        Code Status History    Date Active Date Inactive Code Status Order ID Comments User Context   04/10/2019 1325 04/14/2019 2024 Full Code [id number]  Bettey Costa, MD ED   11/03/2018 1715 11/05/2018 1956 Full Code [id number]  Sela Hua, MD Inpatient   04/17/2018 0905 04/19/2018 1649 Full Code [id number]  Arta Silence, MD Inpatient   08/04/2016 0246 08/07/2016 1731 Full Code [id number]  Lance Coon, MD  Inpatient   01/23/2015 2114 01/28/2015 1717 Full Code [id number]  Hower, Aaron Mose, MD ED   Advance Care Planning Activity     Family Communication: Tried to reach son on the phone but voicemail is full. Disposition Plan: We will take things day by day on how the leg looks and feels.  Consultants:  Infectious disease  Vascular surgery  Antibiotics:  ancef  Time spent: 28 minutes  [place]

## 2019-06-03 NOTE — Progress Notes (Signed)
ID Pt says her thigh swelling is better Has some oozing from the skin  Patient Vitals for the past 24 hrs:  BP Temp Temp src Pulse Resp SpO2  06/03/19 1952 140/70 98.2 F (36.8 C) Oral 67 18 100 %  06/03/19 1622 131/66 97.9 F (36.6 C) Oral 64 - 97 %  06/03/19 0736 111/64 98.8 F (37.1 C) Oral 73 - 94 %  06/03/19 0519 114/68 (!) 97.1 F (36.2 C) Axillary 74 20 97 %  06/02/19 2250 - - - 84 18 94 %    O/E alert, no distress Legs severe lymphedema Erythema better rt leg Some superficial oozing   CBC Latest Ref Rng & Units 06/03/2019 06/02/2019 06/01/2019  WBC 4.0 - 10.5 K/uL 5.2 4.3 5.8  Hemoglobin 12.0 - 15.0 g/dL 9.3(L) 11.0(L) 11.4(L)  Hematocrit 36.0 - 46.0 % 31.2(L) 34.8(L) 36.4  Platelets 150 - 400 K/uL 148(L) 160 172   CMP Latest Ref Rng & Units 06/03/2019 06/02/2019 06/01/2019  Glucose 70 - 99 mg/dL 103(H) 111(H) 79  BUN 8 - 23 mg/dL 19 15 17   Creatinine 0.44 - 1.00 mg/dL 0.88 0.94 0.95  Sodium 135 - 145 mmol/L 139 140 141  Potassium 3.5 - 5.1 mmol/L 4.0 3.9 3.9  Chloride 98 - 111 mmol/L 104 104 102  CO2 22 - 32 mmol/L 27 29 27   Calcium 8.9 - 10.3 mg/dL 8.0(L) 8.6(L) 9.0  Total Protein 6.5 - 8.1 g/dL - - -  Total Bilirubin 0.3 - 1.2 mg/dL - - -  Alkaline Phos 38 - 126 U/L - - -  AST 15 - 41 U/L - - -  ALT 0 - 44 U/L - - -    Impression and recommendation  Severe lymphedema both legs right more than left Cellulitis right leg On cefazolin IV Keep leg elevated Recommend compression wraps with gauze and [place] On discharge she will get Keflex 500 mg p.o. every 6 for 5 days followed by 500 mg p.o. twice daily for at least a month.  The latter is a suppressive therapy for recurrent cellulitis and admissions and it may be prolonged for at least 6 months.  She will follow-up with me as outpatient.   OSA on CPAP  Hypertension on atenolol  Discussed the management with the patient and Dr. Leslye Peer

## 2019-06-04 LAB — BASIC METABOLIC PANEL
Anion gap: 7 (ref 5–15)
BUN: 16 mg/dL (ref 8–23)
CO2: 30 mmol/L (ref 22–32)
Calcium: 8.5 mg/dL — ABNORMAL LOW (ref 8.9–10.3)
Chloride: 104 mmol/L (ref 98–111)
Creatinine, Ser: 0.83 mg/dL (ref 0.44–1.00)
GFR calc Af Amer: >60 mL/min (ref >60)
GFR calc non Af Amer: >60 mL/min (ref >60)
Glucose, Bld: 92 mg/dL (ref 70–99)
Potassium: 4.1 mmol/L (ref 3.5–5.1)
Sodium: 141 mmol/L (ref 135–145)

## 2019-06-04 LAB — VITAMIN B12: Vitamin B-12: 255 pg/mL (ref 180–914)

## 2019-06-04 LAB — FERRITIN: Ferritin: 102 ng/mL (ref 11–307)

## 2019-06-04 NOTE — Progress Notes (Signed)
Patient ID: [name], female   DOB: Nov 13, 1955, 63 y.o.   MRN: [id number] [facility] PROGRESS NOTE  [name] [id number] DOB: [birth year] DOA: 06/01/2019 PCP: Ranae Plumber, PA  HPI/Subjective: Patient still has some areas that are a little tender on her right leg.  She states has been draining.  States she is urinating very well.  Objective: Vitals:   06/03/19 2330 06/04/19 0738  BP: (!) 142/66 127/69  Pulse: 73 74  Resp: 18   Temp: 98.1 F (36.7 C) 98.2 F (36.8 C)  SpO2: 98% 95%    Intake/Output Summary (Last 24 hours) at 06/04/2019 1123 Last data filed at 06/04/2019 1004 Gross per 24 hour  Intake 480 ml  Output 8900 ml  Net -8420 ml   Filed Weights   06/01/19 1541 06/02/19 0100  Weight: (!) 149.7 kg (!) 193.3 kg    ROS: Review of Systems  Constitutional: Negative for chills and fever.  Eyes: Negative for blurred vision.  Respiratory: Negative for cough and shortness of breath.   Cardiovascular: Negative for chest pain.  Gastrointestinal: Negative for abdominal pain, constipation, diarrhea, nausea and vomiting.  Genitourinary: Negative for dysuria.  Musculoskeletal: Positive for joint pain.  Neurological: Negative for dizziness and headaches.   Exam: Physical Exam  Constitutional: She is oriented to person, place, and time.  HENT:  Nose: No mucosal edema.  Mouth/Throat: No oropharyngeal exudate or posterior oropharyngeal edema.  Eyes: Pupils are equal, round, and reactive to light. Conjunctivae, EOM and lids are normal.  Neck: No JVD present. Carotid bruit is not present. No edema present. No thyroid mass and no thyromegaly present.  Cardiovascular: S1 normal and S2 normal. Exam reveals no gallop.  No murmur heard. Pulses:      Dorsalis pedis pulses are 2+ on the right side and 2+ on the left side.  Respiratory: No respiratory distress. She has no wheezes. She has no rhonchi. She has no rales.  GI: Soft. Bowel sounds are normal.  There is no abdominal tenderness.  Musculoskeletal:     Right knee: She exhibits swelling.     Left knee: She exhibits swelling.     Right ankle: She exhibits swelling.     Left ankle: She exhibits swelling.  Lymphadenopathy:    She has no cervical adenopathy.  Neurological: She is alert and oriented to person, place, and time. No cranial nerve deficit.  Skin: Skin is warm. Nails show no clubbing.  Left leg chronic lymphedema.  Slight erythema.  Small ulceration left shin.  No signs of infection around the ulcer. Right leg erythema is starting to fade.  Still with some tenderness on the right shin and right medial calf.   Psychiatric: She has a normal mood and affect.      Data Reviewed: Basic Metabolic Panel: Recent Labs  Lab 06/01/19 1825 06/02/19 0451 06/03/19 0430 06/04/19 0520  NA 141 140 139 141  K 3.9 3.9 4.0 4.1  CL 102 104 104 104  CO2 27 29 27 30   GLUCOSE 79 111* 103* 92  BUN 17 15 19 16   CREATININE 0.95 0.94 0.88 0.83  CALCIUM 9.0 8.6* 8.0* 8.5*   CBC: Recent Labs  Lab 06/01/19 1825 06/02/19 0451 06/03/19 0430  WBC 5.8 4.3 5.2  HGB 11.4* 11.0* 9.3*  HCT 36.4 34.8* 31.2*  MCV 82.2 82.5 85.7  PLT 172 160 148*   BNP (last 3 results) Recent Labs    03/25/19 1305  BNP 76.0  Recent Results (from the past 240 hour(s))  SARS CORONAVIRUS 2 (TAT 6-24 HRS) Nasopharyngeal Nasopharyngeal Swab     Status: None   Collection Time: 06/01/19  8:48 PM   Specimen: Nasopharyngeal Swab  Result Value Ref Range Status   SARS Coronavirus 2 NEGATIVE NEGATIVE Final    Comment: (NOTE) SARS-CoV-2 target nucleic acids are NOT DETECTED. The SARS-CoV-2 RNA is generally detectable in upper and lower respiratory specimens during the acute phase of infection. Negative results do not preclude SARS-CoV-2 infection, do not rule out co-infections with other pathogens, and should not be used as the sole basis for treatment or other patient management decisions. Negative  results must be combined with clinical observations, patient history, and epidemiological information. The expected result is Negative. Fact Sheet for Patients: SugarRoll.be Fact Sheet for Healthcare Providers: https://www.woods-mathews.com/ This test is not yet approved or cleared by the [place] FDA and  has been authorized for detection and/or diagnosis of SARS-CoV-2 by FDA under an Emergency Use Authorization (EUA). This EUA will remain  in effect (meaning this test can be used) for the duration of the COVID-19 declaration under Section 56 4(b)(1) of the Act, 21 U.S.C. section 360bbb-3(b)(1), unless the authorization is terminated or revoked sooner. Performed at [facility], [place] [address] [place], [place] [address]       Scheduled Meds: . acidophilus  1 capsule Oral Daily  . atenolol  50 mg Oral Daily  . enoxaparin (LOVENOX) injection  40 mg Subcutaneous Q12H  . furosemide  40 mg Oral Daily   Continuous Infusions: .  ceFAZolin (ANCEF) IV 2 g (06/04/19 0947)    Assessment/Plan:  1. Right lower extremity cellulitis on top of chronic lymphedema bilateral lower extremities.  IV Ancef.  Continue low-dose oral Lasix. 2. Chronic lower extremity lymphedema.  Will need to follow-up in the lymphedema clinic.. 3. Essential hypertension on atenolol. 4. Normocytic anemia.  Ferritin normal range. 5. Morbid obesity with a BMI of 73.14.,  Sleep apnea and hypertension are related.  Continue CPAP at night.  Code Status:     Code Status Orders  (From admission, onward)         Start     Ordered   06/01/19 2153  Full code  Continuous     06/01/19 2152        Code Status History    Date Active Date Inactive Code Status Order ID Comments User Context   04/10/2019 1325 04/14/2019 2024 Full Code [id number]  Bettey Costa, MD ED   11/03/2018 1715 11/05/2018 1956 Full Code [id number]  Sela Hua, MD Inpatient   04/17/2018 0905  04/19/2018 1649 Full Code [id number]  Arta Silence, MD Inpatient   08/04/2016 0246 08/07/2016 1731 Full Code [id number]  Lance Coon, MD Inpatient   01/23/2015 2114 01/28/2015 1717 Full Code [id number]  Hower, Aaron Mose, MD ED   Advance Care Planning Activity     Family Communication: Called son on the phone. Disposition Plan: We will take things day by day on how the leg looks and feels.  Consultants:  Infectious disease  Vascular surgery  Antibiotics:  ancef  Time spent: 27 minutes  [place]

## 2019-06-04 NOTE — Progress Notes (Signed)
   06/04/19 1500  Clinical Encounter Type  Visited With Patient  Visit Type Follow-up  Spiritual Encounters  Spiritual Needs Sacred text;Emotional  Ch followed up with the pt on a routine visit. Pt was in good mood. Ch read Psalm 27 with the patient and listened to [employer] together and sang with it. Pt was content with the visit.

## 2019-06-05 LAB — BASIC METABOLIC PANEL
Anion gap: 9 (ref 5–15)
BUN: 17 mg/dL (ref 8–23)
CO2: 30 mmol/L (ref 22–32)
Calcium: 8.6 mg/dL — ABNORMAL LOW (ref 8.9–10.3)
Chloride: 100 mmol/L (ref 98–111)
Creatinine, Ser: 0.77 mg/dL (ref 0.44–1.00)
GFR calc Af Amer: >60 mL/min (ref >60)
GFR calc non Af Amer: >60 mL/min (ref >60)
Glucose, Bld: 95 mg/dL (ref 70–99)
Potassium: 4 mmol/L (ref 3.5–5.1)
Sodium: 139 mmol/L (ref 135–145)

## 2019-06-05 MED ORDER — ATENOLOL 25 MG PO TABS
25.0000 mg | ORAL_TABLET | Freq: Every day | ORAL | Status: DC
  Filled 2019-06-05: qty 1

## 2019-06-05 MED ORDER — FUROSEMIDE 20 MG PO TABS
20.0000 mg | ORAL_TABLET | Freq: Once | ORAL | Status: AC
  Administered 2019-06-05: 20 mg via ORAL
  Filled 2019-06-05: qty 1

## 2019-06-05 MED ORDER — FUROSEMIDE 20 MG PO TABS
20.0000 mg | ORAL_TABLET | Freq: Every day | ORAL | Status: DC
  Administered 2019-06-05: 20 mg via ORAL
  Filled 2019-06-05: qty 1

## 2019-06-05 MED ORDER — FUROSEMIDE 40 MG PO TABS
40.0000 mg | ORAL_TABLET | Freq: Every day | ORAL | Status: DC
  Administered 2019-06-06: 40 mg via ORAL
  Filled 2019-06-05: qty 1

## 2019-06-05 NOTE — TOC Initial Note (Signed)
Transition of Care [facility]) - Initial/Assessment Note    Patient Details  Name: [name] MRN: [id number] Date of Birth: 08-28-1955  [facility] of [facility]) CM/SW Contact:    Su Hilt, RN Phone Number: 06/05/2019, 12:09 PM  Clinical Narrative:                 Met with the patient to discuss DC plan and needs She already goes to [facility] rehab outpatient for lymphedema treatment and legs wrapped, she will continue to see them I faxed a referral for the continued care to them  She needs a Hospital bed and a Rolling walker bariatric, I notified Brad with [employer]    Expected Discharge Plan: OP Rehab Barriers to Discharge: Continued Medical Work up   Patient Goals and CMS Choice Patient states their goals for this hospitalization and ongoing recovery are:: go home      Expected Discharge Plan and Services Expected Discharge Plan: OP Rehab   Discharge Planning Services: CM Consult   Living arrangements for the past 2 months: Single Family Home                 DME Arranged: Walker rolling, Hospital bed DME Agency: [employer] Date DME Agency Contacted: 06/05/19 Time DME Agency Contacted: 1208 Representative spoke with at DME Agency: [place] Arranged: NA          Prior Living Arrangements/Services Living arrangements for the past 2 months: [place] Lives with:: Adult Children Patient language and need for interpreter reviewed:: Yes Do you feel safe going back to the place where you live?: Yes      Need for Family Participation in Patient Care: No (Comment) Care giver support system in place?: Yes (comment) Current home services: DME(quad cane) Criminal Activity/Legal Involvement Pertinent to Current Situation/Hospitalization: No - Comment as needed  Activities of Daily Living Home Assistive Devices/Equipment: Cane (specify quad or straight) ADL Screening (condition at time of admission) Patient's cognitive ability adequate to safely complete  daily activities?: Yes Is the patient deaf or have difficulty hearing?: No Does the patient have difficulty seeing, even when wearing glasses/contacts?: No Does the patient have difficulty concentrating, remembering, or making decisions?: No Patient able to express need for assistance with ADLs?: Yes Does the patient have difficulty dressing or bathing?: No Independently performs ADLs?: Yes (appropriate for developmental age) Does the patient have difficulty walking or climbing stairs?: Yes Weakness of Legs: Both Weakness of Arms/Hands: None  Permission Sought/Granted   Permission granted to share information with : Yes, Verbal Permission Granted              Emotional Assessment Appearance:: Appears stated age Attitude/Demeanor/Rapport: Engaged Affect (typically observed): Appropriate Orientation: : Oriented to Self, Oriented to Place, Oriented to  Time, Oriented to Situation Alcohol / Substance Use: Not Applicable Psych Involvement: No (comment)  Admission diagnosis:  Lymphedema [I89.0] Cellulitis of right lower extremity [L03.115] Pain and swelling of right lower leg [T90.300, M79.89] Patient Active Problem List   Diagnosis Date Noted  . Cellulitis of left lower extremity 11/03/2018  . Sepsis ([place]) 04/17/2018  . Chest pain with high risk for cardiac etiology 02/04/2017  . Heart palpitations 02/04/2017  . SOB (shortness of breath) on exertion 02/04/2017  . OSA (obstructive sleep apnea) 08/04/2016  . HTN (hypertension) 08/04/2016  . Asthma 08/04/2016  . Screening for colon cancer 05/17/2015  . Cellulitis 01/24/2015  . Cellulitis of right leg 01/23/2015  . Skin rash 12/28/2013  . Tobacco use  12/28/2013  . Spinal stenosis 07/11/2011  . Cataracts, bilateral 05/30/2010  . History of colonic polyps 12/28/2009  . Lymphedema 02/10/2009  . Idiopathic urticaria 02/10/2009  . Morbid obesity ([place]) 02/10/2009  . Other abnormal glucose 02/10/2009  . Essential (primary)  hypertension 02/10/2009   PCP:  Ranae Plumber, [place] [facility]:   [place], [place] [address] Phone: [phone] Fax: [id number] [place], [place] [address] [address] Phone: [phone] Fax: [phone]  [place], [place] [address] Phone: [phone] Fax: [phone]     Social Determinants of Health (SDOH) Interventions    Readmission Risk Interventions No flowsheet data found.

## 2019-06-05 NOTE — Progress Notes (Signed)
Patient ID: [name], female   DOB: 1955-12-29, 63 y.o.   MRN: [id number] [facility] PROGRESS NOTE  [name] [id number] DOB: 18-Jan-1956 DOA: 06/01/2019 PCP: Ranae Plumber, PA  HPI/Subjective: Patient states that her left leg is now also starting to drain posteriorly. Right leg continues to drain. Still having some soreness in the right leg.  Objective: Vitals:   06/05/19 0435 06/05/19 0856  BP: 91/69 129/66  Pulse: 67 68  Resp:  20  Temp: 98.5 F (36.9 C) 98.7 F (37.1 C)  SpO2: 98% 95%    Intake/Output Summary (Last 24 hours) at 06/05/2019 1101 Last data filed at 06/05/2019 1017 Gross per 24 hour  Intake 735.35 ml  Output 4100 ml  Net -3364.65 ml   Filed Weights   06/01/19 1541 06/02/19 0100  Weight: (!) 149.7 kg (!) 193.3 kg    ROS: Review of Systems  Constitutional: Negative for chills and fever.  Eyes: Negative for blurred vision.  Respiratory: Negative for cough and shortness of breath.   Cardiovascular: Negative for chest pain.  Gastrointestinal: Negative for abdominal pain, constipation, diarrhea, nausea and vomiting.  Genitourinary: Negative for dysuria.  Musculoskeletal: Positive for joint pain.  Neurological: Negative for dizziness and headaches.   Exam: Physical Exam  Constitutional: She is oriented to person, place, and time.  HENT:  Nose: No mucosal edema.  Mouth/Throat: No oropharyngeal exudate or posterior oropharyngeal edema.  Eyes: Pupils are equal, round, and reactive to light. Conjunctivae, EOM and lids are normal.  Neck: No JVD present. Carotid bruit is not present. No edema present. No thyroid mass and no thyromegaly present.  Cardiovascular: S1 normal and S2 normal. Exam reveals no gallop.  No murmur heard. Pulses:      Dorsalis pedis pulses are 2+ on the right side and 2+ on the left side.  Respiratory: No respiratory distress. She has no wheezes. She has no rhonchi. She has no rales.  GI: Soft. Bowel  sounds are normal. There is no abdominal tenderness.  Musculoskeletal:     Right knee: She exhibits swelling.     Left knee: She exhibits swelling.     Right ankle: She exhibits swelling.     Left ankle: She exhibits swelling.  Lymphadenopathy:    She has no cervical adenopathy.  Neurological: She is alert and oriented to person, place, and time. No cranial nerve deficit.  Skin: Skin is warm. Nails show no clubbing.  Left leg chronic lymphedema.  Slight erythema.  Small ulceration left shin.  No signs of infection around the ulcer. Right leg erythema is starting to fade.  Still with some tenderness on the right shin and right medial calf.   Psychiatric: She has a normal mood and affect.      Data Reviewed: Basic Metabolic Panel: Recent Labs  Lab 06/01/19 1825 06/02/19 0451 06/03/19 0430 06/04/19 0520 06/05/19 0548  NA 141 140 139 141 139  K 3.9 3.9 4.0 4.1 4.0  CL 102 104 104 104 100  CO2 27 29 27 30 30   GLUCOSE 79 111* 103* 92 95  BUN 17 15 19 16 17   CREATININE 0.95 0.94 0.88 0.83 0.77  CALCIUM 9.0 8.6* 8.0* 8.5* 8.6*   CBC: Recent Labs  Lab 06/01/19 1825 06/02/19 0451 06/03/19 0430  WBC 5.8 4.3 5.2  HGB 11.4* 11.0* 9.3*  HCT 36.4 34.8* 31.2*  MCV 82.2 82.5 85.7  PLT 172 160 148*   BNP (last 3 results) Recent Labs    03/25/19 1305  BNP 76.0      Recent Results (from the past 240 hour(s))  SARS CORONAVIRUS 2 (TAT 6-24 HRS) Nasopharyngeal Nasopharyngeal Swab     Status: None   Collection Time: 06/01/19  8:48 PM   Specimen: Nasopharyngeal Swab  Result Value Ref Range Status   SARS Coronavirus 2 NEGATIVE NEGATIVE Final    Comment: (NOTE) SARS-CoV-2 target nucleic acids are NOT DETECTED. The SARS-CoV-2 RNA is generally detectable in upper and lower respiratory specimens during the acute phase of infection. Negative results do not preclude SARS-CoV-2 infection, do not rule out co-infections with other pathogens, and should not be used as the sole basis  for treatment or other patient management decisions. Negative results must be combined with clinical observations, patient history, and epidemiological information. The expected result is Negative. Fact Sheet for Patients: SugarRoll.be Fact Sheet for Healthcare Providers: https://www.woods-mathews.com/ This test is not yet approved or cleared by the [place] FDA and  has been authorized for detection and/or diagnosis of SARS-CoV-2 by FDA under an Emergency Use Authorization (EUA). This EUA will remain  in effect (meaning this test can be used) for the duration of the COVID-19 declaration under Section 56 4(b)(1) of the Act, 21 U.S.C. section 360bbb-3(b)(1), unless the authorization is terminated or revoked sooner. Performed at [facility], [place] [address] [place], [place] [address]       Scheduled Meds: . acidophilus  1 capsule Oral Daily  . enoxaparin (LOVENOX) injection  40 mg Subcutaneous Q12H  . [START ON 06/06/2019] furosemide  40 mg Oral Daily   Continuous Infusions: .  ceFAZolin (ANCEF) IV Stopped (06/05/19 1017)    Assessment/Plan:  1. Right lower extremity cellulitis on top of chronic lymphedema bilateral lower extremities.  IV Ancef. Patient hesitant upon going home with her legs draining as much as they are. I told her that her legs are likely draining because they are so large and because of the infection. This likely will continue while this are swollen. I will not get them to stop draining during the hospital course. Continue to monitor daily. Continue oral Lasix. 2. Chronic lower extremity lymphedema.  Will need to follow-up in the lymphedema clinic.. 3. Essential hypertension. Blood pressure on the lower side. Hold atenolol. Continue the Lasix. 4. Normocytic anemia.  Ferritin normal range. 5. Morbid obesity.  Sleep apnea and hypertension are related.  Continue CPAP at night.  Code Status:     Code Status  Orders  (From admission, onward)         Start     Ordered   06/01/19 2153  Full code  Continuous     06/01/19 2152        Code Status History    Date Active Date Inactive Code Status Order ID Comments User Context   04/10/2019 1325 04/14/2019 2024 Full Code [id number]  Bettey Costa, MD ED   11/03/2018 1715 11/05/2018 1956 Full Code [id number]  Sela Hua, MD Inpatient   04/17/2018 0905 04/19/2018 1649 Full Code [id number]  Arta Silence, MD Inpatient   08/04/2016 0246 08/07/2016 1731 Full Code [id number]  Lance Coon, MD Inpatient   01/23/2015 2114 01/28/2015 1717 Full Code [id number]  Hower, Aaron Mose, MD ED   Advance Care Planning Activity     Family Communication: Called son on the phone yesterday Disposition Plan: Evaluate daily on when to make a disposition.  Consultants:  Infectious disease  Vascular surgery  Antibiotics:  ancef  Time spent: 26 minutes  Jaikob Borgward[employer]  [facility]

## 2019-06-05 NOTE — Progress Notes (Signed)
Physical Therapy Treatment Patient Details Name: [name] MRN: [id number] DOB: August 16, 1955 Today's Date: 06/05/2019    History of Present Illness [name] is a 63 y.o. F with MO c/b severe chronic lymphedema, OSA on CPAP, and hypertension who presents with increased right leg pain and swelling, now with redness and drainage, suspicion for infection from wound clinic.    PT Comments    Pt extremely happy to see PT arrive and eager to do some physical activity.  She struggled with bed mobility but showed great effort.  She needed assist with each of her LEs and to elevate trunk, but with mutual cuing and guidance we were able to get to EOB w/o issue.  She did well with transfer and ambulation to the door and back and was able to tolerate prolonged standing/balance/marching in place exercises.  Adjusted QC to an appropriate height (and discussed appropriate usage and maintenance).    Follow Up Recommendations  Home health PT;Supervision for mobility/OOB     Equipment Recommendations  Hospital bed;Other (comment)(quad cane)    Recommendations for Other Services       Precautions / Restrictions Precautions Precautions: Fall Restrictions Weight Bearing Restrictions: No    Mobility  Bed Mobility Overal bed mobility: Needs Assistance Bed Mobility: Rolling;Supine to Sit;Sit to Supine Rolling: Mod assist   Supine to sit: Mod assist;Max assist Sit to supine: Max assist;+2 for physical assistance   General bed mobility comments: Pt showed great effort with all aspects of bed mobility.  She is tender to touch on b/l posterior legs, and though she needed assist with all aspects of moving LEs as well as control of trunk.   Transfers Overall transfer level: Needs assistance Equipment used: Quad cane   Sit to Stand: Min guard         General transfer comment: Pt in nearly standing position at EOB, with some UE use and momentum she was able to rise to standing w/o  physical assist  Ambulation/Gait Ambulation/Gait assistance: Min guard Gait Distance (Feet): 35 Feet Assistive device: Quad cane(b/l QCs, adjusted to more appropriate height)       General Gait Details: Pt was able to ambulate slowly but with good confidence and did not require excessively heavy reliance on UEs/AD.     Stairs             Wheelchair Mobility    Modified Rankin (Stroke Patients Only)       Balance Overall balance assessment: Modified Independent;Mild deficits observed, not formally tested                                          Cognition Arousal/Alertness: Awake/alert Behavior During Therapy: WFL for tasks assessed/performed Overall Cognitive Status: Within Functional Limits for tasks assessed                                        Exercises Other Exercises Other Exercises: educated and performed HEP type LE exercises including ankle pumps, quad sets, hip IR/ER and glut sets.  Standing at EOB marching in place with b/l UEs with QC    General Comments        Pertinent Vitals/Pain Pain Assessment: 0-10 Pain Score: 5  Pain Location: R upper calf and L lower calf     Home  Living                      Prior Function            PT Goals (current goals can now be found in the care plan section) Progress towards PT goals: Progressing toward goals    Frequency    Min 2X/week      PT Plan Current plan remains appropriate    Co-evaluation              AM-PAC PT "6 Clicks" Mobility   Outcome Measure  Help needed turning from your back to your side while in a flat bed without using bedrails?: Total Help needed moving from lying on your back to sitting on the side of a flat bed without using bedrails?: Total Help needed moving to and from a bed to a chair (including a wheelchair)?: A Little Help needed standing up from a chair using your arms (e.g., wheelchair or bedside chair)?: A  Little Help needed to walk in hospital room?: A Little Help needed climbing 3-5 steps with a railing? : Total 6 Click Score: 12    End of Session Equipment Utilized During Treatment: Gait belt Activity Tolerance: Patient tolerated treatment well;Patient limited by fatigue;Patient limited by pain Patient left: in bed;with call bell/phone within reach Nurse Communication: Mobility status PT Visit Diagnosis: Unsteadiness on feet (R26.81);Other abnormalities of gait and mobility (R26.89)     Time: [id number] PT Time Calculation (min) (ACUTE ONLY): 46 min  Charges:  $Gait Training: 8-22 mins $Therapeutic Exercise: 8-22 mins $Therapeutic Activity: 8-22 mins                     Kreg Shropshire, DPT 06/05/2019, 5:46 PM

## 2019-06-05 NOTE — Progress Notes (Signed)
ID Pt says she is feeling a little better Has some drainage from legs  Patient Vitals for the past 24 hrs:  BP Temp Temp src Pulse Resp SpO2  06/05/19 1553 (!) 141/89 98.9 F (37.2 C) - 68 18 99 %  06/05/19 0856 129/66 98.7 F (37.1 C) - 68 20 95 %  06/05/19 0435 91/69 98.5 F (36.9 C) Oral 67 - 98 %  06/04/19 2022 134/81 99.2 F (37.3 C) Oral 68 - 94 %    Legs - erythema, swelling improving compared to admission  06/05/19    06/05/19   On admission   Impression and recommendation  Severe lymphedema both legs right more than left Cellulitis right leg On cefazolin IV- continue for another 48-72 hrs Keep leg elevated Recommend compression wraps with gauze and [place] On discharge she will get Keflex 500 mg p.o. every 6 ( total of 10 days including all IV in the hospital) followed by 500 mg p.o. twice daily for at least a month.  The latter is a suppressive therapy for recurrent cellulitis and admissions and it may be prolonged for at least 6 months.  She will follow-up with me as outpatient.   OSA on CPAP  Hypertension on atenolol  Discussed the management with the patient and Dr. Leslye Peer ID will sign off-call if needed

## 2019-06-05 NOTE — Consult Note (Signed)
[place] Nurse Consult Note: Reason for Consult:Chronic lymphedema.  Patient being discharged and just now being consulted to wound care team.  I have spoken with bedside RN for compression instructions and she is agreeable to wrapping her legs as I am on another campus this afternoon.  Wound type: Chronic lymphedema with recent cellulitis.  Not compliant with pneumatic compression and elevation at home.  Does not have removable garment. for compression either.  Will send home in two layer wrap (ace) and she will follow up with outpatient rehab for lymphedema management.  Pressure Injury POA: NA Measurement:Bilateral lower legs with chronic skin changes.  Edema and erythema Wound [id number] and edema to bilateral lower legs.  Cracking and crusted scales present Drainage (amount, consistency, odor) moderate weeping. No odor.  Periwound:see above Dressing procedure/placement/frequency: Cleanse bilateral lower legs with soap and water and pat dry.  Moisturize legs. Wrap from below toes to below knee with kerlix.  Secure with ace wraps. Will likely require 4 rolls of kerlix and 6 ace wrap rolls for the two legs.  COntinue to elevate legs as ordered.  FOllow up with outpatient resources  Will not follow at this time.  Please re-consult if needed.  Domenic Moras MSN, RN, FNP-BC CWON Wound, Ostomy, Continence Nurse Pager [phone]  Conservative sharp wound debridement (CSWD performed at the bedside):

## 2019-06-06 DIAGNOSIS — D638 Anemia in other chronic diseases classified elsewhere: Secondary | ICD-10-CM

## 2019-06-06 DIAGNOSIS — D649 Anemia, unspecified: Secondary | ICD-10-CM

## 2019-06-06 MED ORDER — ONDANSETRON HCL 4 MG/2ML IJ SOLN
4.0000 mg | Freq: Four times a day (QID) | INTRAMUSCULAR | Status: DC | PRN
  Administered 2019-06-06: 4 mg via INTRAVENOUS
  Filled 2019-06-06: qty 2

## 2019-06-06 NOTE — Plan of Care (Signed)
  Problem: Pain Managment: Goal: General experience of comfort will improve Outcome: Progressing   Problem: Safety: Goal: Ability to remain free from injury will improve Outcome: Progressing   Problem: Education: Goal: Knowledge of General Education information will improve Description: Including pain rating scale, medication(s)/side effects and non-pharmacologic comfort measures Outcome: Progressing   Problem: Health Behavior/Discharge Planning: Goal: Ability to manage health-related needs will improve Outcome: Progressing   Problem: Clinical Measurements: Goal: Ability to maintain clinical measurements within normal limits will improve Outcome: Progressing Goal: Will remain free from infection Outcome: Progressing Goal: Diagnostic test results will improve Outcome: Progressing Goal: Respiratory complications will improve Outcome: Progressing Goal: Cardiovascular complication will be avoided Outcome: Progressing   Problem: Activity: Goal: Risk for activity intolerance will decrease Outcome: Progressing   Problem: Nutrition: Goal: Adequate nutrition will be maintained Outcome: Progressing   Problem: Coping: Goal: Level of anxiety will decrease Outcome: Progressing   Problem: Elimination: Goal: Will not experience complications related to bowel motility Outcome: Progressing Goal: Will not experience complications related to urinary retention Outcome: Progressing   Problem: Skin Integrity: Goal: Risk for impaired skin integrity will decrease Outcome: Progressing

## 2019-06-06 NOTE — Progress Notes (Signed)
Patient ID: [name], female   DOB: Feb 25, 1956, 63 y.o.   MRN: [id number] [facility] PROGRESS NOTE  Kirra Heater [id number] DOB: 11-18-1955 DOA: 06/01/2019 PCP: Ranae Plumber, PA  HPI/Subjective: Patient still having drainage from her legs.  Mostly posterior in dependent areas.  Urinating well with the Lasix.  Still having some soreness on the right leg.  Objective: Vitals:   06/06/19 0116 06/06/19 0732  BP: 125/75 (!) 141/73  Pulse: 84 75  Resp: 18 17  Temp: (!) 96.5 F (35.8 C) 97.8 F (36.6 C)  SpO2: 94% 97%    Intake/Output Summary (Last 24 hours) at 06/06/2019 1157 Last data filed at 06/06/2019 0948 Gross per 24 hour  Intake 200 ml  Output 4850 ml  Net -4650 ml   Filed Weights   06/01/19 1541 06/02/19 0100  Weight: (!) 149.7 kg (!) 193.3 kg    ROS: Review of Systems  Constitutional: Negative for chills and fever.  Eyes: Negative for blurred vision.  Respiratory: Negative for cough and shortness of breath.   Cardiovascular: Negative for chest pain.  Gastrointestinal: Negative for abdominal pain, constipation, diarrhea, nausea and vomiting.  Genitourinary: Negative for dysuria.  Musculoskeletal: Positive for joint pain.  Neurological: Negative for dizziness and headaches.   Exam: Physical Exam  Constitutional: She is oriented to person, place, and time.  HENT:  Nose: No mucosal edema.  Mouth/Throat: No oropharyngeal exudate or posterior oropharyngeal edema.  Eyes: Pupils are equal, round, and reactive to light. Conjunctivae, EOM and lids are normal.  Neck: No JVD present. Carotid bruit is not present. No edema present. No thyroid mass and no thyromegaly present.  Cardiovascular: S1 normal and S2 normal. Exam reveals no gallop.  No murmur heard. Pulses:      Dorsalis pedis pulses are 2+ on the right side and 2+ on the left side.  Respiratory: No respiratory distress. She has no wheezes. She has no rhonchi. She has no rales.  GI:  Soft. Bowel sounds are normal. There is no abdominal tenderness.  Musculoskeletal:     Right knee: She exhibits swelling.     Left knee: She exhibits swelling.     Right ankle: She exhibits swelling.     Left ankle: She exhibits swelling.  Lymphadenopathy:    She has no cervical adenopathy.  Neurological: She is alert and oriented to person, place, and time. No cranial nerve deficit.  Skin: Skin is warm. Nails show no clubbing.  Bilateral feet and lower leg wrapped.  Right leg still has an area with some redness and fullness.  Psychiatric: She has a normal mood and affect.      Data Reviewed: Basic Metabolic Panel: Recent Labs  Lab 06/01/19 1825 06/02/19 0451 06/03/19 0430 06/04/19 0520 06/05/19 0548  NA 141 140 139 141 139  K 3.9 3.9 4.0 4.1 4.0  CL 102 104 104 104 100  CO2 27 29 27 30 30   GLUCOSE 79 111* 103* 92 95  BUN 17 15 19 16 17   CREATININE 0.95 0.94 0.88 0.83 0.77  CALCIUM 9.0 8.6* 8.0* 8.5* 8.6*   CBC: Recent Labs  Lab 06/01/19 1825 06/02/19 0451 06/03/19 0430  WBC 5.8 4.3 5.2  HGB 11.4* 11.0* 9.3*  HCT 36.4 34.8* 31.2*  MCV 82.2 82.5 85.7  PLT 172 160 148*   BNP (last 3 results) Recent Labs    03/25/19 1305  BNP 76.0      Recent Results (from the past 240 hour(s))  SARS CORONAVIRUS 2 (  TAT 6-24 HRS) Nasopharyngeal Nasopharyngeal Swab     Status: None   Collection Time: 06/01/19  8:48 PM   Specimen: Nasopharyngeal Swab  Result Value Ref Range Status   SARS Coronavirus 2 NEGATIVE NEGATIVE Final    Comment: (NOTE) SARS-CoV-2 target nucleic acids are NOT DETECTED. The SARS-CoV-2 RNA is generally detectable in upper and lower respiratory specimens during the acute phase of infection. Negative results do not preclude SARS-CoV-2 infection, do not rule out co-infections with other pathogens, and should not be used as the sole basis for treatment or other patient management decisions. Negative results must be combined with clinical  observations, patient history, and epidemiological information. The expected result is Negative. Fact Sheet for Patients: SugarRoll.be Fact Sheet for Healthcare Providers: https://www.woods-mathews.com/ This test is not yet approved or cleared by the [place] FDA and  has been authorized for detection and/or diagnosis of SARS-CoV-2 by FDA under an Emergency Use Authorization (EUA). This EUA will remain  in effect (meaning this test can be used) for the duration of the COVID-19 declaration under Section 56 4(b)(1) of the Act, 21 U.S.C. section 360bbb-3(b)(1), unless the authorization is terminated or revoked sooner. Performed at [facility], [place] [address] , [place] [address]       Scheduled Meds: . acidophilus  1 capsule Oral Daily  . enoxaparin (LOVENOX) injection  40 mg Subcutaneous Q12H  . furosemide  40 mg Oral Daily   Continuous Infusions: .  ceFAZolin (ANCEF) IV 2 g (06/06/19 [id number])    Assessment/Plan:  1. Right lower extremity cellulitis on top of chronic lymphedema bilateral lower extremities.  IV Ancef while here in the hospital.  As per infectious disease will need suppressive antibiotics upon going home.  Reevaluate daily on when to go home.  Continue to monitor daily. Continue oral Lasix. 2. Chronic lower extremity lymphedema.  Will need to follow-up in the lymphedema clinic.  I signed the referral form yesterday. 3. Essential hypertension. Blood pressure on the lower side. Hold atenolol. Continue the Lasix. 4. Normocytic anemia.  Ferritin normal range. 5. Morbid obesity.  Sleep apnea and hypertension are related.  Continue CPAP at night. 6. Anemia.  Recheck hemoglobin tomorrow morning.  Code Status:     Code Status Orders  (From admission, onward)         Start     Ordered   06/01/19 2153  Full code  Continuous     06/01/19 2152        Code Status History    Date Active Date Inactive Code  Status Order ID Comments User Context   04/10/2019 1325 04/14/2019 2024 Full Code [id number]  Bettey Costa, MD ED   11/03/2018 1715 11/05/2018 1956 Full Code [id number]  Sela Hua, MD Inpatient   04/17/2018 0905 04/19/2018 1649 Full Code [id number]  Arta Silence, MD Inpatient   08/04/2016 0246 08/07/2016 1731 Full Code [id number]  Lance Coon, MD Inpatient   01/23/2015 2114 01/28/2015 1717 Full Code [id number]  Hower, Aaron Mose, MD ED   Advance Care Planning Activity     Family Communication: Tried to reach son today but no answer of the phone. Disposition Plan: Evaluate daily on when to make a disposition.  Consultants:  Infectious disease  Vascular surgery  Antibiotics:  ancef  Time spent: 27 minutes  [place]

## 2019-06-07 LAB — BASIC METABOLIC PANEL
Anion gap: 13 (ref 5–15)
BUN: 19 mg/dL (ref 8–23)
CO2: 28 mmol/L (ref 22–32)
Calcium: 8.4 mg/dL — ABNORMAL LOW (ref 8.9–10.3)
Chloride: 99 mmol/L (ref 98–111)
Creatinine, Ser: 0.86 mg/dL (ref 0.44–1.00)
GFR calc Af Amer: >60 mL/min (ref >60)
GFR calc non Af Amer: >60 mL/min (ref >60)
Glucose, Bld: 94 mg/dL (ref 70–99)
Potassium: 4.1 mmol/L (ref 3.5–5.1)
Sodium: 140 mmol/L (ref 135–145)

## 2019-06-07 LAB — CBC
HCT: 33.3 % — ABNORMAL LOW (ref 36.0–46.0)
Hemoglobin: 10.3 g/dL — ABNORMAL LOW (ref 12.0–15.0)
MCH: 25.6 pg — ABNORMAL LOW (ref 26.0–34.0)
MCHC: 30.9 g/dL (ref 30.0–36.0)
MCV: 82.6 fL (ref 80.0–100.0)
Platelets: 159 10*3/uL (ref 150–400)
RBC: 4.03 MIL/uL (ref 3.87–5.11)
RDW: 14.5 % (ref 11.5–15.5)
WBC: 8.1 10*3/uL (ref 4.0–10.5)
nRBC: 0 % (ref 0.0–0.2)

## 2019-06-07 LAB — MAGNESIUM: Magnesium: 1.9 mg/dL (ref 1.7–2.4)

## 2019-06-07 MED ORDER — FUROSEMIDE 20 MG PO TABS: 20.0000 mg | ORAL_TABLET | Freq: Every day | ORAL | 0 refills | Status: DC

## 2019-06-07 MED ORDER — CEPHALEXIN 500 MG PO CAPS: 500.0000 mg | ORAL_CAPSULE | Freq: Once | ORAL | Status: DC

## 2019-06-07 MED ORDER — RISAQUAD PO CAPS: 1.0000 | ORAL_CAPSULE | Freq: Every day | ORAL | 0 refills | Status: DC

## 2019-06-07 MED ORDER — FUROSEMIDE 20 MG PO TABS
20.0000 mg | ORAL_TABLET | Freq: Every day | ORAL | Status: DC
  Filled 2019-06-07: qty 1

## 2019-06-07 MED ORDER — CEPHALEXIN 500 MG PO CAPS: ORAL_CAPSULE | ORAL | 0 refills | Status: DC

## 2019-06-07 MED ORDER — CEPHALEXIN 500 MG PO CAPS
500.0000 mg | ORAL_CAPSULE | Freq: Four times a day (QID) | ORAL | Status: DC
  Administered 2019-06-07: 500 mg via ORAL
  Filled 2019-06-07: qty 1

## 2019-06-07 NOTE — TOC Transition Note (Signed)
Transition of Care [facility]) - CM/SW Discharge Note   Patient Details  Name: [name] MRN: [id number] Date of Birth: [birth year]  Transition of Care [facility]) CM/SW Contact:  Ross Ludwig, LCSW Phone Number: 06/07/2019, 1:23 PM   Clinical Narrative:    Patient will be discharging Home via EMS, patient go to outpatient clinic for therapy.  Patient also goes to the wound center.  Patient lives with her son, and plan is to return back home.   Final next level of care: Home/Self Care Barriers to Discharge: Barriers Resolved   Patient Goals and CMS Choice Patient states their goals for this hospitalization and ongoing recovery are:: To return back home. CMS Medicare.gov Compare Post Acute Care list provided to:: Patient Choice offered to / list presented to : Patient  Discharge Placement  Patient to discharge back home with son via EMS.                     Discharge Plan and Services   Discharge Planning Services: CM Consult            DME Arranged: [facility] Rile wide, Hospital bed DME Agency: [employer] Date DME Agency Contacted: 06/07/19 Time DME Agency Contacted: 66 Representative spoke with at DME Agency: [place]: NA          Social Determinants of Health ([place]) Interventions     Readmission Risk Interventions No flowsheet data found.

## 2019-06-07 NOTE — Discharge Instructions (Signed)

## 2019-06-07 NOTE — Progress Notes (Signed)
Pt is being discharged home.  Discharge papers given and explained to pt.  Pt verbalized understanding.  Meds and f/u appointments reviewed. Rx sent electronically to pharmacy.  Pt made aware.  Awaiting EMS.

## 2019-06-07 NOTE — Discharge Summary (Signed)
[place] [facility] [place] NAME: [name]    MR#:  [id number]  DATE OF BIRTH:  July 08, 1956  DATE OF ADMISSION:  06/01/2019 ADMITTING PHYSICIAN: Orene Desanctis, DO  DATE OF DISCHARGE: 06/07/2019  PRIMARY CARE PHYSICIAN: Ranae Plumber, PA    ADMISSION DIAGNOSIS:  Lymphedema [I89.0] Cellulitis of right lower extremity [L03.115] Pain and swelling of right lower leg [M79.661, M79.89]  DISCHARGE DIAGNOSIS:  Principal Problem:   Cellulitis of right leg Active Problems:   Lymphedema   OSA (obstructive sleep apnea)   HTN (hypertension)   Morbid obesity (HCC)   Anemia   SECONDARY DIAGNOSIS:   Past Medical History:  Diagnosis Date  . Arthritis   . Asthma   . Enlarged heart   . Hypertension   . Lymphedema   . Sleep apnea   . Spinal stenosis     HOSPITAL COURSE:   1.  Right lower extremity cellulitis on top of chronic lymphedema bilateral lower extremities.  Her right leg looks more infected at this point than her left leg.  She was given IV Ancef during the hospital stay.  She was seen by vascular surgery.  She was also seen by infectious disease.  She will continue on Keflex upon going home for 5 more days 4 times a day and then suppressive therapy twice a day after that.  Prescription written for 1 month supply.  Since her legs are now draining we did use ABD pads on the drainage port and covered with Kerlix and Ace wrap.  She will follow up with the lymphedema clinic for continued wrappings and set up for home health. 2.  Chronic lower extremity lymphedema.  I signed the referral form for the lymphedema clinic on Friday she will call and set up an appointment for this week. 3.  Essential hypertension.  Blood pressure on the lower side since I started Lasix to get rid of fluid.  Hold the atenolol at this time and continue low-dose Lasix as outpatient. 4.  Normocytic anemia.  Ferritin in the normal range. 5.  Morbid obesity.  Sleep apnea  and hypertension are related.  Continue CPAP at night.  DISCHARGE CONDITIONS:   Satisfactory  CONSULTS OBTAINED:  Infectious disease Vascular surgery  DRUG ALLERGIES:   Allergies  Allergen Reactions  . Vancomycin Itching and Nausea And Vomiting  . Ace Inhibitors Hives and Swelling  . Ibuprofen Hives and Swelling    DISCHARGE MEDICATIONS:   Allergies as of 06/07/2019      Reactions   Vancomycin Itching, Nausea And Vomiting   Ace Inhibitors Hives, Swelling   Ibuprofen Hives, Swelling      Medication List    STOP taking these medications   atenolol 50 MG tablet Commonly known as: TENORMIN   cefdinir 300 MG capsule Commonly known as: OMNICEF     TAKE these medications   acetaminophen 650 MG CR tablet Commonly known as: TYLENOL Take 650-1,300 mg by mouth every 8 (eight) hours as needed for pain.   acidophilus Caps capsule Take 1 capsule by mouth daily.   albuterol 108 (90 Base) MCG/ACT inhaler Commonly known as: VENTOLIN HFA Inhale 2 puffs into the lungs every 6 (six) hours as needed for wheezing or shortness of breath.   cephALEXin 500 MG capsule Commonly known as: KEFLEX One tab 500mg  po four times a day for five days then one tab twice a day afterwards for suppressive therapy   cetirizine 10 MG tablet Commonly known as:  ZYRTEC Take 10 mg by mouth daily as needed for allergies.   furosemide 20 MG tablet Commonly known as: LASIX Take 1 tablet (20 mg total) by mouth daily.   polyethylene glycol 17 g packet Commonly known as: MIRALAX / GLYCOLAX Take 17 g by mouth daily as needed for mild constipation.            Durable Medical Equipment  (From admission, onward)         Start     Ordered   06/05/19 1215  For home use only DME Walker rolling  Once    Comments: Bariatric walker  Question:  Patient needs a walker to treat with the following condition  Answer:  Lymphedema   06/05/19 1215   06/05/19 1215  For home use only DME Hospital bed  Once     Comments: Bariatric Sometimes need to elevate legs over heart  Question Answer Comment  Length of Need Lifetime   The above medical condition requires: Patient requires the ability to reposition frequently   Head must be elevated greater than: Other see comments   Bed type Heavy-duty, semi-electric (for patients >350 lbs.)   Support Surface: Alternating Pressure Pad and Pump      06/05/19 1215           DISCHARGE INSTRUCTIONS:   Follow-up PMD 5 days Follow-up infectious disease 2 weeks Follow-up lymphedema clinic 1 week  If you experience worsening of your admission symptoms, develop shortness of breath, life threatening emergency, suicidal or homicidal thoughts you must seek medical attention immediately by calling 911 or calling your MD immediately  if symptoms less severe.  You Must read complete instructions/literature along with all the possible adverse reactions/side effects for all the Medicines you take and that have been prescribed to you. Take any new Medicines after you have completely understood and accept all the possible adverse reactions/side effects.   Please note  You were cared for by a hospitalist during your hospital stay. If you have any questions about your discharge medications or the care you received while you were in the hospital after you are discharged, you can call the unit and asked to speak with the hospitalist on call if the hospitalist that took care of you is not available. Once you are discharged, your primary care physician will handle any further medical issues. Please note that NO REFILLS for any discharge medications will be authorized once you are discharged, as it is imperative that you return to your primary care physician (or establish a relationship with a primary care physician if you do not have one) for your aftercare needs so that they can reassess your need for medications and monitor your lab values.    Today   CHIEF COMPLAINT:    Chief Complaint  Patient presents with  . Leg Swelling    HISTORY OF PRESENT ILLNESS:  [name]  is a 63 y.o. female coming in with leg swelling.   VITAL SIGNS:  Blood pressure 124/73, pulse 82, temperature 97.8 F (36.6 C), temperature source Oral, resp. rate 17, height 5\' 4"  (1.626 m), weight (!) 193.3 kg, SpO2 96 %.  I/O:    Intake/Output Summary (Last 24 hours) at 06/07/2019 1609 Last data filed at 06/07/2019 1439 Gross per 24 hour  Intake 835.8 ml  Output 1500 ml  Net -664.2 ml    PHYSICAL EXAMINATION:  GENERAL:  63 y.o.-year-old patient lying in the bed with no acute distress.  EYES: Pupils equal, round, reactive  to light and accommodation. No scleral icterus. Extraocular muscles intact.  HEENT: Head atraumatic, normocephalic. Oropharynx and nasopharynx clear.  NECK:  Supple, no jugular venous distention. No thyroid enlargement, no tenderness.  LUNGS: Normal breath sounds bilaterally, no wheezing, rales,rhonchi or crepitation. No use of accessory muscles of respiration.  CARDIOVASCULAR: S1, S2 normal. No murmurs, rubs, or gallops.  ABDOMEN: Soft, non-tender, non-distended. Bowel sounds present. No organomegaly or mass.  EXTREMITIES: 4+ pedal edema.no cyanosis, or clubbing.  NEUROLOGIC: Cranial nerves II through XII are intact. Muscle strength 5/5 in all extremities. Sensation intact. Gait not checked.  PSYCHIATRIC: The patient is alert and oriented x 3.  SKIN: Normal lower extremity swelling and chronic changes consistent with lymphedema.  Erythema seen on the right leg which is fading.  Drainage on the back of her legs secondary to dependent edema.  DATA REVIEW:   CBC Recent Labs  Lab 06/07/19 0438  WBC 8.1  HGB 10.3*  HCT 33.3*  PLT 159    Chemistries  Recent Labs  Lab 06/07/19 0438  NA 140  K 4.1  CL 99  CO2 28  GLUCOSE 94  BUN 19  CREATININE 0.86  CALCIUM 8.4*  MG 1.9    Microbiology Results  Results for orders placed or performed  during the hospital encounter of 06/01/19  SARS CORONAVIRUS 2 (TAT 6-24 HRS) Nasopharyngeal Nasopharyngeal Swab     Status: None   Collection Time: 06/01/19  8:48 PM   Specimen: Nasopharyngeal Swab  Result Value Ref Range Status   SARS Coronavirus 2 NEGATIVE NEGATIVE Final    Comment: (NOTE) SARS-CoV-2 target nucleic acids are NOT DETECTED. The SARS-CoV-2 RNA is generally detectable in upper and lower respiratory specimens during the acute phase of infection. Negative results do not preclude SARS-CoV-2 infection, do not rule out co-infections with other pathogens, and should not be used as the sole basis for treatment or other patient management decisions. Negative results must be combined with clinical observations, patient history, and epidemiological information. The expected result is Negative. Fact Sheet for Patients: SugarRoll.be Fact Sheet for Healthcare Providers: https://www.woods-mathews.com/ This test is not yet approved or cleared by the [place] FDA and  has been authorized for detection and/or diagnosis of SARS-CoV-2 by FDA under an Emergency Use Authorization (EUA). This EUA will remain  in effect (meaning this test can be used) for the duration of the COVID-19 declaration under Section 56 4(b)(1) of the Act, 21 U.S.C. section 360bbb-3(b)(1), unless the authorization is terminated or revoked sooner. Performed at [facility], [place] [address] [place], [place] [address]     Management plans discussed with the patient, and she is in agreement.  CODE STATUS:     Code Status Orders  (From admission, onward)         Start     Ordered   06/01/19 2153  Full code  Continuous     06/01/19 2152        Code Status History    Date Active Date Inactive Code Status Order ID Comments User Context   04/10/2019 1325 04/14/2019 2024 Full Code [id number]  Bettey Costa, MD ED   11/03/2018 1715 11/05/2018 1956 Full Code  [id number]  Sela Hua, MD Inpatient   04/17/2018 0905 04/19/2018 1649 Full Code [id number]  Arta Silence, MD Inpatient   08/04/2016 0246 08/07/2016 1731 Full Code [id number]  Lance Coon, MD Inpatient   01/23/2015 2114 01/28/2015 1717 Full Code [id number]  Hower, Aaron Mose, MD ED   Advance  Care Planning Activity      TOTAL TIME TAKING CARE OF THIS PATIENT: 35 minutes.    Loletha Grayer M.D on 06/07/2019 at 4:09 PM  Between 7am to 6pm - Pager - [phone]  After 6pm go to [email or web address] - password [email or web address] [place]  [facility]  CC: Primary care physician; Ranae Plumber, [place]

## 2019-06-08 ENCOUNTER — Ambulatory Visit: Payer: Medicaid Other | Admitting: Physician Assistant

## 2019-06-10 ENCOUNTER — Ambulatory Visit: Payer: Medicaid Other | Attending: Physician Assistant | Admitting: Occupational Therapy

## 2019-06-10 ENCOUNTER — Ambulatory Visit: Payer: Medicaid Other | Admitting: Occupational Therapy

## 2019-06-11 ENCOUNTER — Other Ambulatory Visit: Payer: Self-pay

## 2019-06-11 ENCOUNTER — Telehealth: Payer: Self-pay

## 2019-06-11 NOTE — Telephone Encounter (Signed)
Lm x2

## 2019-06-11 NOTE — Telephone Encounter (Signed)
-----   Message from [name] sent at 06/10/2019  8:59 AM EST ----- Please make an appt for this patient in 3 weeks. thx

## 2019-06-12 ENCOUNTER — Encounter: Payer: Medicaid Other | Attending: Physician Assistant | Admitting: Physician Assistant

## 2019-06-12 ENCOUNTER — Other Ambulatory Visit: Payer: Self-pay

## 2019-06-12 DIAGNOSIS — E669 Obesity, unspecified: Secondary | ICD-10-CM | POA: Insufficient documentation

## 2019-06-12 DIAGNOSIS — M109 Gout, unspecified: Secondary | ICD-10-CM | POA: Diagnosis not present

## 2019-06-12 DIAGNOSIS — I10 Essential (primary) hypertension: Secondary | ICD-10-CM | POA: Diagnosis not present

## 2019-06-12 DIAGNOSIS — G4730 Sleep apnea, unspecified: Secondary | ICD-10-CM | POA: Diagnosis not present

## 2019-06-12 DIAGNOSIS — E11622 Type 2 diabetes mellitus with other skin ulcer: Secondary | ICD-10-CM | POA: Diagnosis present

## 2019-06-12 DIAGNOSIS — L97822 Non-pressure chronic ulcer of other part of left lower leg with fat layer exposed: Secondary | ICD-10-CM | POA: Insufficient documentation

## 2019-06-12 DIAGNOSIS — J45909 Unspecified asthma, uncomplicated: Secondary | ICD-10-CM | POA: Insufficient documentation

## 2019-06-12 DIAGNOSIS — I890 Lymphedema, not elsewhere classified: Secondary | ICD-10-CM | POA: Insufficient documentation

## 2019-06-12 DIAGNOSIS — Z6841 Body Mass Index (BMI) 40.0 and over, adult: Secondary | ICD-10-CM | POA: Insufficient documentation

## 2019-06-12 DIAGNOSIS — Z87891 Personal history of nicotine dependence: Secondary | ICD-10-CM | POA: Diagnosis not present

## 2019-06-12 DIAGNOSIS — M1990 Unspecified osteoarthritis, unspecified site: Secondary | ICD-10-CM | POA: Insufficient documentation

## 2019-06-12 DIAGNOSIS — L97812 Non-pressure chronic ulcer of other part of right lower leg with fat layer exposed: Secondary | ICD-10-CM | POA: Insufficient documentation

## 2019-06-12 NOTE — Progress Notes (Addendum)
Elizabeth, [name] ([id number]) Visit Report for 06/12/2019 Chief Complaint Document Details Patient Name: [name], [name]. Date of Service: 06/12/2019 2:00 PM Medical Record Number: [id number] Patient Account Number: [id number] Date of Birth/Sex: May 20, 1956 (63 y.o. F) Treating RN: Montey Hora Primary Care Provider: Tandy Gaw Other Clinician: Referring Provider: Tandy Gaw Treating Provider/Extender: Melburn Hake, HOYT Weeks in Treatment: 16 Information Obtained from: Patient Chief Complaint Bilateral LE lymphedema Electronic Signature(s) Signed: 06/12/2019 2:26:17 PM By: Worthy Keeler PA-C Entered By: Worthy Keeler on 06/12/2019 14:26:17 Attia, [name] ([id number]) -------------------------------------------------------------------------------- HPI Details Patient Name: [name]. Date of Service: 06/12/2019 2:00 PM Medical Record Number: [id number] Patient Account Number: [id number] Date of Birth/Sex: July 02, 1956 (63 y.o. F) Treating RN: Montey Hora Primary Care Provider: Tandy Gaw Other Clinician: Referring Provider: Tandy Gaw Treating Provider/Extender: STONE III, HOYT Weeks in Treatment: 16 History of Present Illness HPI Description: The patient is a 63 year old female with history of hypertension and a long-standing history of bilateral lower extremity lymphedema (first presented on 4/2) . She has had open ulcers in the past which have always responded to compression therapy. She had briefly been to a lymphedema clinic in the past which helped her at the time. this time around she stopped treatment of her lymphedema pumps approximately 2 weeks ago because of some pain in the knees and then noticed the right leg getting worse. She was seen by her PCP who put her on clindamycin 4 times a day 2 days ago. The patient has seen [facility] and Dr. Delana Meyer had seen her last year where a vascular study including venous and arterial duplex studies were within normal  limits. he had recommended compression stockings and lymphedema pumps and the patient has been using this in about 2 weeks ago. She is known to be diabetic but in the past few time she's gone to her primary care doctor her hemoglobin A1c has been normal. 02/11/2015 - after her last visit she took my advice and went to the ER regarding the progressive cellulitis of her right lower extremity and she was admitted between July 17 and 22nd. She received IV antibiotics and then was sent home on a course of steroid-induced and oral antibiotics. She has improved much since then. 02/17/2015 -- she has been doing fine and the weeping of her legs has remarkably gone down. She has no fresh issues. READMISSION 01/15/18 This patient was given this clinic before most recently in 2016 seen by Dr. Con Memos. She has massive bilateral lymphedema and over the last 2 months this had weeping edema out of the left leg. She has compression pumps but her compliance with these has been minimal. She has [facility] they've been using TCA/ABDs/kerlix under an Ace wrap.she has had recent problems with cellulitis. She was apparently seen in the ER and 12/23/17 and given clindamycin. She was then followed by her primary doctor and given doxycycline and Keflex. The pain seems to have settled down. In April 2018 the patient had arterial studies done at [facility]. This showed triphasic waveforms throughout the right leg and mostly triphasic waveforms on the left except for monophasic at the posterior tibial artery distally. She was not felt to have evidence of right lower extremity arterial stenosis or significant problems on the left side. 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 She also had a right lower extremity venous Doppler in January 2018 this was limited by the patient's body habitus and lymphedema. Most of the  proximal veins were not visualized The patient presents with  an area of denuded skin on the anterior medial part of the left calf. There is weeping edema fluid here. 01/22/18; the patient has somewhat better edema control using her compression pumps twice a day and as a result she has much better epithelialization on the left anterior calf area. Only a small open area remains. 01/29/18; the patient has been compliant with her compression pumps. Both the areas on her calf that healed. The remaining area on the left anterior leg is fully epithelialized Readmission: 02/20/2019 upon evaluation today patient presents for reevaluation due to issues that she is having with the bilateral lower extremities. She actually has wounds open on both legs. On the right she has an area in the crease of her leg on the right around the knee region which is actually draining quite a bit and actually has some fungal type appearance to it. She has been on nystatin powder that seems to have helped to some degree. In regard to the left lower extremity this is actually in the [place]. ([id number]) lower portion of her leg closer to the ankle and again is continuing to drain as well unfortunately. There does not appear to be any signs of active infection at this time which is good news. No fevers, chills, nausea, vomiting, or diarrhea. She tells me that since she was seen last year she is actually been doing quite well for the most part with regard to her lower extremities. Unfortunately she now is experiencing a little bit more drainage at this time. She is concerned about getting this under control so that it does not get significantly worse. 02/27/2019 on evaluation today patient appears to be doing somewhat better in regard to her bilateral lower extremity wounds. She has been tolerating the dressing changes without complication. Fortunately there is no signs of active infection at this point. No fevers, chills, nausea, vomiting, or diarrhea. She did get her dressing  supplies which is excellent news she was extremely excited to get these. She also got paperwork from [employer] for their financial assistance program where they may be able to help her out in the future if needed with supplies at discounted prices. 03/06/2019 on evaluation today patient appears to be doing a little worse with regard to both areas of weeping on her bilateral lower extremities. This is around the right medial knee and just above the left ankle. With that being said she is unfortunately not doing as well as I would like to see. I feel like she may need to potentially go see someone at the lymphedema clinic as the wraps that she needs or even beyond what we can do here at the wound care center. She really does not have wounds she just has open areas of weeping that are causing some difficulty for her. Subsequently because of this and the moisture I am concerned about the potential for infection I am going to likely give her a prophylactic antibiotic today, Keflex, just to be on the safe side. Nonetheless again there is no obvious signs of active infection at this time. 03/13/2019 on evaluation today patient appears to be doing well with regard to her bilateral lower extremities where she has been weeping compared to even last week's evaluation. I see some areas of new skin growth which is excellent and overall I am very pleased with how things seem to be progressing. No fevers, chills, nausea, vomiting, or diarrhea. 03/20/2019 on evaluation  today patient unfortunately is continuing to have issues with significant edema of the left lower extremity. Her right side seems to be doing much better. Unfortunately her left side is showing increased weeping of the lower portion of her leg. This is quite unfortunate obviously we were hoping to get her into the lymphedema clinic they really do not seem to when I see her how if she is draining. Despite the fact this is really not wound related but more  lymphedema weeping related. Nonetheless I do not know that this can be helpful for her to even go for that appointment since again I am not sure there is much that they would actually do at this point. We may need to try a 4 layer compression wrap as best we can on her leg. She is on the Augmentin currently although I am still concerned about whether or not there could be potentially something going on infection wise I would obtain a culture though I understand is not the best being that is a surface culture I just 1 to make sure I do not seem to be missing anything. 03/27/2019 on evaluation today patient appears to be doing much better in regard to the left lower extremity compared to last week. Last week she had tremendous weeping which I think was subsequent to infection now she seems to be doing much better and very pleased. This is not completely healed but there is a lot of new skin growth and it has dried out quite a bit. Overall I think that we are doing well with how things are moving along at this time. No fevers, chills, nausea, vomiting, or diarrhea. 04/03/2019 evaluation today patient appears to be doing a little worse this week compared to last time I saw her. I think this may be due to the fact that she is having issues with not being able to sleep in her bed at least not until last night. She is therefore been in a lift chair and subsequently has also had issues with not been able to use her pumps since she could not get in bed. With that being said the patient overall seems to be doing okay I do think I may want extend the antibiotic for a little bit longer at least until we can see if her edema and her weeping gets better and if it is then obviously I can always discontinue the antibiotics as of next week however I want her to continue to have it over the next week. 04/10/2019 on evaluation today patient unfortunately is still doing poorly with regard to her left lower extremity. Her  right is all things considering doing fairly well. On the left however she continues to have spreading of the area of infection and weeping which appears to be even a larger surface area than noted last week. She did have a positive culture for Pseudomonas in particular which seems to have been of concern she still has green/yellow discharge consistent with Pseudomonas and subsequently a tremendous amount of it. This has me obviously still concerned about the infection not really clearing up despite the fact that on culture it appears the Cipro should have been a good option for treating this. I think she may at this point need IV antibiotics since things are not doing better I do not want to get worse and cause sepsis. She is in agreement with the plan and believes as well that she likely does need to go to the hospital for  IV vancomycin. Or something of the like depending on what the recommendation is from the ER. 04/17/2019 on evaluation today patient appears to be doing excellent in regard to her lower extremity on the left. She was in the hospital for several days from when I sent her last we saw her until just this past Tuesday. Fortunately her drainage is significantly improved and in fact is mostly clear. There is just a couple small areas that may still drain a little bit she states SHECID, WASSMANN. ([id number]) that the [facility] they prescribed for her at discharge she went picked up from pharmacy and got home but has not been able to find it since. She is looked everywhere. She is wondering if I will replace that for her today I will be more than happy to do that. 05/01/2019 on evaluation today patient actually appears to be doing quite well with regard to her lower extremities. She occasionally is having areas that will leak and then heal up mainly when a piece of the fibrotic skin pops off but fortunately she is not having any signs of active infection at this time. Overall she also  really does not have any obvious weeping at this time. I do believe however she really needs some compression wraps and I think this may be a good time to get her back to the lymphedema clinic. 05/11/2019 on evaluation today patient actually appears to be doing quite well with regard to her bilateral lower extremities. She occasionally will have a small area that we per another but in general seems to be completely healed which is great news. Overall very pleased with how everything seems to be progressing. She does have her appointment with lymphedema clinic on November 18. 05/25/2019 on evaluation today patient appears to be doing well with regard to her left lower extremity. I am very pleased in this regard. In regard to her right leg this actually did start draining more I think it is mainly due to the fact that her leg is more swollen. I am not seeing any obvious signs of infection at this time although that is definitely something were obviously acutely aware of simply due to the fact that she had an issue not too far back with exactly this issue. Nonetheless I do feel like that lymphedema clinic would still be beneficial for her. I explained obviously if they are not able to do anything treatment wise on the right leg we could at least have them treat her left leg and then proceed from there. The patient is really in agreement with that plan. If they are able to do both as the drainage slows down that I would be happy to let them handle both. 06/01/2019 on evaluation today patient unfortunately appears to be doing worse with regard to her right lower extremity. The left lower extremity is still maintaining at this point. Unfortunately she has been having significantly increased pain over the past several days and has been experiencing as well increased swelling of the right lower extremity. I really do not know that I am seeing anything that appears to be obvious for infection at this point to  be peripherally honest. With that being said the patient does seem to be having much more swelling that she is even experienced in the past and coupled with increased pain in her hip as well I am concerned that again she could potentially have a DVT although I am not 100% sure of this. I think it something  that may need to be checked out. We discussed the possibility of sending her for a DVT study through the hospital but unfortunately transportation is an issue if she does have a DVT I do not want her to wait days to be able to get in for that test however if she has this scheduled as an outpatient that is as fast that she will be able to get the test scheduled for transportation purposes. That will also fall on Thanksgiving so subsequently she did actually be looking at either Friday or even next week before we would know anything back from this. That is much too long in my opinion. Subsequent to the amount of discomfort she is experiencing the patient is actually okay with going to the ER for evaluation today. 06/12/2019 on evaluation today patient actually appears to be doing significantly better compared to last time I saw her. Following when I last saw her she was actually in the hospital from that Monday until the following Sunday almost 1 full week. She actually was placed on Keflex in the hospital following the time for her to be discharged and Dr. Steva Ready has recommended 2 times a day dosing of the Keflex for the next year in order to help with more prophylactic/preventative measures with regard to her developing cellulitis. Overall I think this sounds like an excellent plan. The patient unfortunately is good to have trouble being treated at lymphedema clinic due to the fact that she really cannot get up on the bed that they have there. They also state that they cannot manage her as long as she has anything draining at this point. Obviously that is somewhat unfortunate as she does need  help with edema control but nonetheless we will have to do what we can for her outside of it sounds like the lymphedema clinic scenario at this point. Electronic Signature(s) Signed: 06/12/2019 3:11:01 PM By: Worthy Keeler PA-C Entered By: Worthy Keeler on 06/12/2019 15:11:00 Sek, [name] ([id number]) -------------------------------------------------------------------------------- Physical Exam Details Patient Name: LADENA, PENA C. Date of Service: 06/12/2019 2:00 PM Medical Record Number: [id number] Patient Account Number: [id number] Date of Birth/Sex: 11/25/1955 (63 y.o. F) Treating RN: Montey Hora Primary Care Provider: Tandy Gaw Other Clinician: Referring Provider: Tandy Gaw Treating Provider/Extender: STONE III, HOYT Weeks in Treatment: 102 Constitutional Well-nourished and well-hydrated in no acute distress. Respiratory normal breathing without difficulty. clear to auscultation bilaterally. Cardiovascular regular rate and rhythm with normal S1, S2. Psychiatric this patient is able to make decisions and demonstrates good insight into disease process. Alert and Oriented x 3. pleasant and cooperative. Notes Patient's wound bed currently showed signs of doing fairly well on the right leg anteriorly. She has a couple areas posteriorly there on the left leg that are actually open and on the right this is mainly just lymphedema in general with no specific wound breakdown. That is good news. Overall I am very pleased with how things are doing I see no evidence of infection at this time. Electronic Signature(s) Signed: 06/12/2019 3:11:38 PM By: Worthy Keeler PA-C Entered By: Worthy Keeler on 06/12/2019 15:11:37 Skop, [name] ([id number]) -------------------------------------------------------------------------------- Physician Orders Details Patient Name: [name] Date of Service: 06/12/2019 2:00 PM Medical Record Number: [id number] Patient Account  Number: [id number] Date of Birth/Sex: 1956-01-11 (63 y.o. F) Treating RN: Montey Hora Primary Care Provider: Tandy Gaw Other Clinician: Referring Provider: Tandy Gaw Treating Provider/Extender: STONE III, HOYT Weeks in Treatment: 16 Verbal / Phone Orders: No Diagnosis Coding  ICD-10 Coding Code Description I89.0 Lymphedema, not elsewhere classified L97.822 Non-pressure chronic ulcer of other part of left lower leg with fat layer exposed L97.812 Non-pressure chronic ulcer of other part of right lower leg with fat layer exposed Wound Cleansing o Dial antibacterial soap, wash wounds, rinse and pat dry prior to dressing wounds o May Shower, gently pat wound dry prior to applying new dressing. Primary Wound Dressing o Silver Alginate - Nystatin powder under silver alginate Secondary Dressing o ABD and Kerlix/Conform o XtraSorb Dressing Change Frequency o Change dressing every other day. - and as needed Follow-up Appointments o Return Appointment in 1 week. Edema Control o Compression Pump: Use compression pump on left lower extremity for 60 minutes, twice daily. o Compression Pump: Use compression pump on right lower extremity for 60 minutes, twice daily. o Other: - ACE Wraps Electronic Signature(s) Signed: 06/12/2019 5:08:51 PM By: Montey Hora Signed: 06/13/2019 11:37:39 PM By: Worthy Keeler PA-C Entered By: Montey Hora on 06/12/2019 15:05:16 Aultman, [name] ([id number]) -------------------------------------------------------------------------------- Problem List Details Patient Name: JOHNNI, LUCATERO. Date of Service: 06/12/2019 2:00 PM Medical Record Number: [id number] Patient Account Number: [id number] Date of Birth/Sex: 02-Jun-1956 (63 y.o. F) Treating RN: Montey Hora Primary Care Provider: Tandy Gaw Other Clinician: Referring Provider: Tandy Gaw Treating Provider/Extender: STONE III, HOYT Weeks in Treatment: 16 Active  Problems ICD-10 Evaluated Encounter Code Description Active Date Today Diagnosis I89.0 Lymphedema, not elsewhere classified 02/20/2019 No Yes L97.822 Non-pressure chronic ulcer of other part of left lower leg with 02/20/2019 No Yes fat layer exposed L97.812 Non-pressure chronic ulcer of other part of right lower leg 02/20/2019 No Yes with fat layer exposed Inactive Problems Resolved Problems Electronic Signature(s) Signed: 06/12/2019 2:26:11 PM By: Worthy Keeler PA-C Entered By: Worthy Keeler on 06/12/2019 14:26:11 Derks, [name] ([id number]) -------------------------------------------------------------------------------- Progress Note Details Patient Name: [name]. Date of Service: 06/12/2019 2:00 PM Medical Record Number: [id number] Patient Account Number: [id number] Date of Birth/Sex: September 07, 1955 (63 y.o. F) Treating RN: Montey Hora Primary Care Provider: Tandy Gaw Other Clinician: Referring Provider: Tandy Gaw Treating Provider/Extender: STONE III, HOYT Weeks in Treatment: 16 Subjective Chief Complaint Information obtained from Patient Bilateral LE lymphedema History of Present Illness (HPI) The patient is a 63 year old female with history of hypertension and a long-standing history of bilateral lower extremity lymphedema (first presented on 4/2) . She has had open ulcers in the past which have always responded to compression therapy. She had briefly been to a lymphedema clinic in the past which helped her at the time. this time around she stopped treatment of her lymphedema pumps approximately 2 weeks ago because of some pain in the knees and then noticed the right leg getting worse. She was seen by her PCP who put her on clindamycin 4 times a day 2 days ago. The patient has seen [facility] and Dr. Delana Meyer had seen her last year where a vascular study including venous and arterial duplex studies were within normal limits. he had recommended compression  stockings and lymphedema pumps and the patient has been using this in about 2 weeks ago. She is known to be diabetic but in the past few time she's gone to her primary care doctor her hemoglobin A1c has been normal. 02/11/2015 - after her last visit she took my advice and went to the ER regarding the progressive cellulitis of her right lower extremity and she was admitted between July 17 and 22nd. She received IV antibiotics and then was sent home on a  course of steroid-induced and oral antibiotics. She has improved much since then. 02/17/2015 -- she has been doing fine and the weeping of her legs has remarkably gone down. She has no fresh issues. READMISSION 01/15/18 This patient was given this clinic before most recently in 2016 seen by Dr. Con Memos. She has massive bilateral lymphedema and over the last 2 months this had weeping edema out of the left leg. She has compression pumps but her compliance with these has been minimal. She has [facility] they've been using TCA/ABDs/kerlix under an Ace wrap.she has had recent problems with cellulitis. She was apparently seen in the ER and 12/23/17 and given clindamycin. She was then followed by her primary doctor and given doxycycline and Keflex. The pain seems to have settled down. In April 2018 the patient had arterial studies done at [facility]. This showed triphasic waveforms throughout the right leg and mostly triphasic waveforms on the left except for monophasic at the posterior tibial artery distally. She was not felt to have evidence of right lower extremity arterial stenosis or significant problems on the left side. 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 She also had a right lower extremity venous Doppler in January 2018 this was limited by the patient's body habitus and lymphedema. Most of the proximal veins were not visualized The patient presents with an area of denuded skin on the anterior  medial part of the left calf. There is weeping edema fluid here. 01/22/18; the patient has somewhat better edema control using her compression pumps twice a day and as a result she has much better epithelialization on the left anterior calf area. Only a small open area remains. 01/29/18; the patient has been compliant with her compression pumps. Both the areas on her calf that healed. The remaining area on the left anterior leg is fully epithelialized LODELL, MAGNIN. ([id number]) Readmission: 02/20/2019 upon evaluation today patient presents for reevaluation due to issues that she is having with the bilateral lower extremities. She actually has wounds open on both legs. On the right she has an area in the crease of her leg on the right around the knee region which is actually draining quite a bit and actually has some fungal type appearance to it. She has been on nystatin powder that seems to have helped to some degree. In regard to the left lower extremity this is actually in the lower portion of her leg closer to the ankle and again is continuing to drain as well unfortunately. There does not appear to be any signs of active infection at this time which is good news. No fevers, chills, nausea, vomiting, or diarrhea. She tells me that since she was seen last year she is actually been doing quite well for the most part with regard to her lower extremities. Unfortunately she now is experiencing a little bit more drainage at this time. She is concerned about getting this under control so that it does not get significantly worse. 02/27/2019 on evaluation today patient appears to be doing somewhat better in regard to her bilateral lower extremity wounds. She has been tolerating the dressing changes without complication. Fortunately there is no signs of active infection at this point. No fevers, chills, nausea, vomiting, or diarrhea. She did get her dressing supplies which is excellent news she  was extremely excited to get these. She also got paperwork from [employer] for their financial assistance program where they may be able to help her out  in the future if needed with supplies at discounted prices. 03/06/2019 on evaluation today patient appears to be doing a little worse with regard to both areas of weeping on her bilateral lower extremities. This is around the right medial knee and just above the left ankle. With that being said she is unfortunately not doing as well as I would like to see. I feel like she may need to potentially go see someone at the lymphedema clinic as the wraps that she needs or even beyond what we can do here at the wound care center. She really does not have wounds she just has open areas of weeping that are causing some difficulty for her. Subsequently because of this and the moisture I am concerned about the potential for infection I am going to likely give her a prophylactic antibiotic today, Keflex, just to be on the safe side. Nonetheless again there is no obvious signs of active infection at this time. 03/13/2019 on evaluation today patient appears to be doing well with regard to her bilateral lower extremities where she has been weeping compared to even last week's evaluation. I see some areas of new skin growth which is excellent and overall I am very pleased with how things seem to be progressing. No fevers, chills, nausea, vomiting, or diarrhea. 03/20/2019 on evaluation today patient unfortunately is continuing to have issues with significant edema of the left lower extremity. Her right side seems to be doing much better. Unfortunately her left side is showing increased weeping of the lower portion of her leg. This is quite unfortunate obviously we were hoping to get her into the lymphedema clinic they really do not seem to when I see her how if she is draining. Despite the fact this is really not wound related but more lymphedema weeping related. Nonetheless  I do not know that this can be helpful for her to even go for that appointment since again I am not sure there is much that they would actually do at this point. We may need to try a 4 layer compression wrap as best we can on her leg. She is on the Augmentin currently although I am still concerned about whether or not there could be potentially something going on infection wise I would obtain a culture though I understand is not the best being that is a surface culture I just 1 to make sure I do not seem to be missing anything. 03/27/2019 on evaluation today patient appears to be doing much better in regard to the left lower extremity compared to last week. Last week she had tremendous weeping which I think was subsequent to infection now she seems to be doing much better and very pleased. This is not completely healed but there is a lot of new skin growth and it has dried out quite a bit. Overall I think that we are doing well with how things are moving along at this time. No fevers, chills, nausea, vomiting, or diarrhea. 04/03/2019 evaluation today patient appears to be doing a little worse this week compared to last time I saw her. I think this may be due to the fact that she is having issues with not being able to sleep in her bed at least not until last night. She is therefore been in a lift chair and subsequently has also had issues with not been able to use her pumps since she could not get in bed. With that being said the patient overall seems to be  doing okay I do think I may want extend the antibiotic for a little bit longer at least until we can see if her edema and her weeping gets better and if it is then obviously I can always discontinue the antibiotics as of next week however I want her to continue to have it over the next week. 04/10/2019 on evaluation today patient unfortunately is still doing poorly with regard to her left lower extremity. Her right is all things considering doing  fairly well. On the left however she continues to have spreading of the area of infection and weeping which appears to be even a larger surface area than noted last week. She did have a positive culture for Pseudomonas in particular which seems to have been of concern she still has green/yellow discharge consistent with Pseudomonas and subsequently a tremendous amount of it. This has me obviously still concerned about the infection not really clearing up despite the fact that on culture it appears the Cipro should have been a good option for treating this. I think she may at this point need IV antibiotics since things are not doing better I do not want to get worse and cause sepsis. She is in agreement YUNUEN, MOREFIELD. ([id number]) with the plan and believes as well that she likely does need to go to the hospital for IV vancomycin. Or something of the like depending on what the recommendation is from the ER. 04/17/2019 on evaluation today patient appears to be doing excellent in regard to her lower extremity on the left. She was in the hospital for several days from when I sent her last we saw her until just this past Tuesday. Fortunately her drainage is significantly improved and in fact is mostly clear. There is just a couple small areas that may still drain a little bit she states that the [facility] they prescribed for her at discharge she went picked up from pharmacy and got home but has not been able to find it since. She is looked everywhere. She is wondering if I will replace that for her today I will be more than happy to do that. 05/01/2019 on evaluation today patient actually appears to be doing quite well with regard to her lower extremities. She occasionally is having areas that will leak and then heal up mainly when a piece of the fibrotic skin pops off but fortunately she is not having any signs of active infection at this time. Overall she also really does not have any obvious weeping at  this time. I do believe however she really needs some compression wraps and I think this may be a good time to get her back to the lymphedema clinic. 05/11/2019 on evaluation today patient actually appears to be doing quite well with regard to her bilateral lower extremities. She occasionally will have a small area that we per another but in general seems to be completely healed which is great news. Overall very pleased with how everything seems to be progressing. She does have her appointment with lymphedema clinic on November 18. 05/25/2019 on evaluation today patient appears to be doing well with regard to her left lower extremity. I am very pleased in this regard. In regard to her right leg this actually did start draining more I think it is mainly due to the fact that her leg is more swollen. I am not seeing any obvious signs of infection at this time although that is definitely something were obviously acutely aware of simply due  to the fact that she had an issue not too far back with exactly this issue. Nonetheless I do feel like that lymphedema clinic would still be beneficial for her. I explained obviously if they are not able to do anything treatment wise on the right leg we could at least have them treat her left leg and then proceed from there. The patient is really in agreement with that plan. If they are able to do both as the drainage slows down that I would be happy to let them handle both. 06/01/2019 on evaluation today patient unfortunately appears to be doing worse with regard to her right lower extremity. The left lower extremity is still maintaining at this point. Unfortunately she has been having significantly increased pain over the past several days and has been experiencing as well increased swelling of the right lower extremity. I really do not know that I am seeing anything that appears to be obvious for infection at this point to be peripherally honest. With that being  said the patient does seem to be having much more swelling that she is even experienced in the past and coupled with increased pain in her hip as well I am concerned that again she could potentially have a DVT although I am not 100% sure of this. I think it something that may need to be checked out. We discussed the possibility of sending her for a DVT study through the hospital but unfortunately transportation is an issue if she does have a DVT I do not want her to wait days to be able to get in for that test however if she has this scheduled as an outpatient that is as fast that she will be able to get the test scheduled for transportation purposes. That will also fall on Thanksgiving so subsequently she did actually be looking at either Friday or even next week before we would know anything back from this. That is much too long in my opinion. Subsequent to the amount of discomfort she is experiencing the patient is actually okay with going to the ER for evaluation today. 06/12/2019 on evaluation today patient actually appears to be doing significantly better compared to last time I saw her. Following when I last saw her she was actually in the hospital from that Monday until the following Sunday almost 1 full week. She actually was placed on Keflex in the hospital following the time for her to be discharged and Dr. Steva Ready has recommended 2 times a day dosing of the Keflex for the next year in order to help with more prophylactic/preventative measures with regard to her developing cellulitis. Overall I think this sounds like an excellent plan. The patient unfortunately is good to have trouble being treated at lymphedema clinic due to the fact that she really cannot get up on the bed that they have there. They also state that they cannot manage her as long as she has anything draining at this point. Obviously that is somewhat unfortunate as she does need help with edema control but nonetheless we  will have to do what we can for her outside of it sounds like the lymphedema clinic scenario at this point. Patient History Information obtained from Patient. Family History Cancer - Father, Diabetes - Father, Heart Disease - Mother, Hypertension - Mother,Father, Seizures - Mother, Stroke - Mother, Thyroid Problems - Mother,Siblings, No family history of Hereditary Spherocytosis, Kidney Disease, Lung Disease, Tuberculosis. GUINEVERE, KULBACKI ([id number]) Social History Former smoker - uses snuff, Marital  Status - Single, Alcohol Use - Rarely, Drug Use - No History, Caffeine Use - Daily. 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 Denies history of Aspiration, Chronic Obstructive Pulmonary Disease (COPD), Pneumothorax Cardiovascular Patient has history of Hypertension Denies history of Angina, Arrhythmia, Congestive Heart Failure, Coronary Artery Disease, Deep Vein Thrombosis, Hypotension, Myocardial Infarction, Peripheral Arterial Disease, Peripheral Venous Disease, Phlebitis, Vasculitis Gastrointestinal Denies history of Cirrhosis , Colitis, Crohn s, Hepatitis A, Hepatitis B, Hepatitis C Endocrine Denies history of Type I Diabetes, Type II Diabetes Genitourinary Denies history of End Stage Renal Disease Immunological Denies history of Lupus Erythematosus, Raynaud s, Scleroderma Integumentary (Skin) Denies history of History of Burn, History of pressure wounds Musculoskeletal Patient has history of Gout, Osteoarthritis Denies history of Rheumatoid Arthritis,  Osteomyelitis Neurologic Denies history of Dementia, Neuropathy, Quadriplegia, Paraplegia, Seizure Disorder Oncologic Denies history of Received Chemotherapy, Received Radiation Psychiatric Denies history of Anorexia/bulimia, Confinement Anxiety Review of Systems (ROS) Constitutional Symptoms (General Health) Denies complaints or symptoms of Fatigue, Fever, Chills, Marked Weight Change. Respiratory Denies complaints or symptoms of Chronic or frequent coughs, Shortness of Breath. Cardiovascular Complains or has symptoms of LE edema. Denies complaints or symptoms of Chest pain. Psychiatric Denies complaints or symptoms of Anxiety, Claustrophobia. MARGERET, SLIVA ([id number]) Objective Constitutional Well-nourished and well-hydrated in no acute distress. Vitals Time Taken: 2:09 PM, Height: 62 in, Weight: 390 lbs, BMI: 71.3, Temperature: 98.8 F, Pulse: 102 bpm, Respiratory Rate: 20 breaths/min, Blood Pressure: 129/91 mmHg. Respiratory normal breathing without difficulty. clear to auscultation bilaterally. Cardiovascular regular rate and rhythm with normal S1, S2. Psychiatric this patient is able to make decisions and demonstrates good insight into disease process. Alert and Oriented x 3. pleasant and cooperative. General Notes: Patient's wound bed currently showed signs of doing fairly well on the right leg anteriorly. She has a couple areas posteriorly there on the left leg that are actually open and on the right this is mainly just lymphedema in general with no specific wound breakdown. That is good news. Overall I am very pleased with how things are doing I see no evidence of infection at this time. Integumentary (Hair, Skin) Wound #6 status is Open. Original cause of wound was Gradually Appeared. The wound is located on the Left,Posterior Lower Leg. The wound measures 0.5cm length x 2cm width x 0.1cm depth; 0.785cm^2 area and 0.079cm^3 volume. There is Fat Layer (Subcutaneous  Tissue) Exposed exposed. There is no tunneling or undermining noted. There is a medium amount of serous drainage noted. The wound margin is flat and intact. There is medium (34-66%) red granulation within the wound bed. There is a medium (34-66%) amount of necrotic tissue within the wound bed including Adherent Slough. Assessment Active Problems ICD-10 Lymphedema, not elsewhere classified Non-pressure chronic ulcer of other part of left lower leg with fat layer exposed Non-pressure chronic ulcer of other part of right lower leg with fat layer exposed Plan Wound Cleansing: Dial antibacterial soap, wash wounds, rinse and pat dry prior to dressing wounds May Shower, gently pat wound dry prior to applying new dressing. Primary Wound Dressing: Silver Alginate - Nystatin powder under silver alginate Secondary Dressing: CARLOTTA, ETCHISON ([id number]) ABD and Kerlix/Conform XtraSorb Dressing Change  Frequency: Change dressing every other day. - and as needed Follow-up Appointments: Return Appointment in 1 week. Edema Control: Compression Pump: Use compression pump on left lower extremity for 60 minutes, twice daily. Compression Pump: Use compression pump on right lower extremity for 60 minutes, twice daily. Other: - ACE Wraps 1. My suggestion currently is good to be that she continue with the ABD pad followed by securing this as she can with Kerlix she tells me that she does fairly well doing this on her own at this point. 2. I do believe she can apply lotion to the area surrounding the wound where she has a lot of dry skin this will keep things from cracking and therefore breaking down I think that will be good for her as well. 3. Also recommend that she continue with elevating her legs as much as she can fortunately she has gotten bariatric bed and this will help would be able to get her legs elevated while she sleeps at night. With that being said she is going to have to have a platform in  order to be able to get up on the bed. She has a neighbor who is good with woodworking who is going to make her a platform. She is very excited about this. We will see patient back for reevaluation in 1 week here in the clinic. If anything worsens or changes patient will contact our office for additional recommendations. Electronic Signature(s) Signed: 06/12/2019 3:12:33 PM By: Worthy Keeler PA-C Entered By: Worthy Keeler on 06/12/2019 15:12:33 Renshaw, [name] ([id number]) -------------------------------------------------------------------------------- ROS/PFSH Details Patient Name: [name] Date of Service: 06/12/2019 2:00 PM Medical Record Number: [id number] Patient Account Number: [id number] Date of Birth/Sex: 11/25/1955 (63 y.o. 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 Negative for: Aspiration; Chronic Obstructive Pulmonary Disease (COPD); Pneumothorax Cardiovascular Complaints and Symptoms: Positive for: LE edema Negative for: Chest pain Medical History: Positive for: Hypertension Negative for: Angina; Arrhythmia; Congestive Heart Failure; Coronary Artery Disease; Deep Vein Thrombosis; Hypotension; Myocardial Infarction; Peripheral Arterial Disease; Peripheral Venous Disease; Phlebitis; Vasculitis Psychiatric Complaints and Symptoms: Negative for: Anxiety; Claustrophobia Medical History: Negative for: Anorexia/bulimia; Confinement Anxiety Eyes Medical  History: Positive for: Cataracts Negative for: Glaucoma; Optic Neuritis Ear/Nose/Mouth/Throat Medical History: Negative for: Chronic sinus problems/congestion; Middle ear problems Hematologic/Lymphatic Medical History: Positive for: Lymphedema LARONA, SHEFFLER. ([id number]) Negative for: Anemia; Hemophilia; Human Immunodeficiency Virus; Sickle Cell Disease Gastrointestinal Medical History: Negative for: Cirrhosis ; Colitis; Crohnos; Hepatitis A; Hepatitis B; Hepatitis C Endocrine Medical History: Negative for: Type I Diabetes; Type II Diabetes Genitourinary Medical History: Negative for: End Stage Renal Disease Immunological Medical History: Negative for: Lupus Erythematosus; Raynaudos; Scleroderma Integumentary (Skin) Medical History: Negative for: History of Burn; History of pressure wounds Musculoskeletal Medical History: Positive for: Gout; Osteoarthritis Negative for: Rheumatoid Arthritis; Osteomyelitis Neurologic Medical History: Negative for: Dementia; Neuropathy; Quadriplegia; Paraplegia; Seizure Disorder Oncologic Medical History: Negative for: Received Chemotherapy; Received Radiation HBO Extended History Items Eyes: Cataracts Immunizations Pneumococcal Vaccine: Received Pneumococcal Vaccination: Yes Implantable Devices No devices added Family and Social History Cancer: Yes - Father; Diabetes: Yes - Father; Heart Disease:  Yes - Mother; Hereditary Spherocytosis: No; Hypertension: Yes - Mother,Father; Kidney Disease: No; Lung Disease: No; Seizures: Yes - Mother; Stroke: Yes - Mother; Thyroid Problems: Yes - Mother,Siblings; Tuberculosis: No; Former smoker - uses snuff; Marital Status - Single; Alcohol Use: Rarely; Drug Use: KWANDA, DEWBRE C. ([id number]) No History; Caffeine Use: Daily; Financial Concerns: No; Food, Clothing or Shelter Needs: No; Support System Lacking: No; Transportation Concerns: No Physician Affirmation I have reviewed and agree with the  above information. Electronic Signature(s) Signed: 06/12/2019 5:08:51 PM By: Montey Hora Signed: 06/13/2019 11:37:39 PM By: Worthy Keeler PA-C Entered By: Worthy Keeler on 06/12/2019 15:11:26 [place]bourne, [name] ([id number]) -------------------------------------------------------------------------------- SuperBill Details Patient Name: [name]. Date of Service: 06/12/2019 Medical Record Number: [id number] Patient Account Number: [id number] Date of Birth/Sex: 1955-07-20 (63 y.o. F) Treating RN: Montey Hora Primary Care Provider: Tandy Gaw Other Clinician: Referring Provider: Tandy Gaw Treating Provider/Extender: STONE III, HOYT Weeks in Treatment: 16 Diagnosis Coding ICD-10 Codes Code Description I89.0 Lymphedema, not elsewhere classified L97.822 Non-pressure chronic ulcer of other part of left lower leg with fat layer exposed L97.812 Non-pressure chronic ulcer of other part of right lower leg with fat layer exposed Facility Procedures CPT4 Code: [id number] Description: 99213 - WOUND CARE VISIT-LEV 3 EST PT Modifier: Quantity: 1 Physician Procedures CPT4 Code Description: [id number] - WC PHYS LEVEL 4 - EST PT ICD-10 Diagnosis Description I89.0 Lymphedema, not elsewhere classified L97.822 Non-pressure chronic ulcer of other part of left lower leg wit L97.812 Non-pressure chronic ulcer of other  part of right lower leg wi Modifier: h fat layer expos th fat layer expo Quantity: 1 ed sed Electronic Signature(s) Signed: 06/12/2019 3:12:44 PM By: Worthy Keeler PA-C Entered By: Worthy Keeler on 06/12/2019 15:12:44

## 2019-06-12 NOTE — Progress Notes (Signed)
JAELANI, BOEING ([id number]) Visit Report for 06/12/2019 Arrival Information Details Patient Name: [name], [name]. Date of Service: 06/12/2019 2:00 PM Medical Record Number: [id number] Patient Account Number: [id number] Date of Birth/Sex: [birth year] (63 y.o. F) Treating RN: Montey Hora Primary Care Saanvika Vazques: Tandy Gaw Other Clinician: Referring Traeh Milroy: Tandy Gaw Treating Chenee Munns/Extender: STONE III, HOYT Weeks in Treatment: 16 Visit Information History Since Last Visit Added or deleted any medications: Yes Patient Arrived: Cane Any new allergies or adverse reactions: No Arrival Time: 14:04 Had a fall or experienced change in No Accompanied By: self activities of daily living that may affect Transfer Assistance: None risk of falls: Patient Identification Verified: Yes Signs or symptoms of abuse/neglect since last visito No Secondary Verification Process Completed: Yes Hospitalized since last visit: No Patient Requires Transmission-Based Precautions: No Implantable device outside of the clinic excluding No Patient Has Alerts: No cellular tissue based products placed in the center since last visit: Has Dressing in Place as Prescribed: Yes Pain Present Now: No Electronic Signature(s) Signed: 06/12/2019 3:46:50 PM By: Lorine Bears RCP, RRT, CHT Entered By: Lorine Bears on 06/12/2019 14:06:54 Mcneff, [name] ([id number]) -------------------------------------------------------------------------------- Clinic Level of Care Assessment Details Patient Name: [name]. Date of Service: 06/12/2019 2:00 PM Medical Record Number: [id number] Patient Account Number: [id number] Date of Birth/Sex: 07/05/1956 (63 y.o. F) Treating RN: Montey Hora Primary Care Jaymason Ledesma: Tandy Gaw Other Clinician: Referring Marabelle Cushman: Tandy Gaw Treating Romelia Bromell/Extender: STONE III, HOYT Weeks in Treatment: 16 Clinic Level of Care Assessment  Items TOOL 4 Quantity Score []  - Use when only an EandM is performed on FOLLOW-UP visit 0 ASSESSMENTS - Nursing Assessment / Reassessment X - Reassessment of Co-morbidities (includes updates in patient status) 1 10 X- 1 5 Reassessment of Adherence to Treatment Plan ASSESSMENTS - Wound and Skin Assessment / Reassessment X - Simple Wound Assessment / Reassessment - one wound 1 5 []  - 0 Complex Wound Assessment / Reassessment - multiple wounds X- 1 10 Dermatologic / Skin Assessment (not related to wound area) ASSESSMENTS - Focused Assessment []  - Circumferential Edema Measurements - multi extremities 0 []  - 0 Nutritional Assessment / Counseling / Intervention X- 1 5 Lower Extremity Assessment (monofilament, tuning fork, pulses) []  - 0 Peripheral Arterial Disease Assessment (using hand held doppler) ASSESSMENTS - Ostomy and/or Continence Assessment and Care []  - Incontinence Assessment and Management 0 []  - 0 Ostomy Care Assessment and Management (repouching, etc.) PROCESS - Coordination of Care X - Simple Patient / Family Education for ongoing care 1 15 []  - 0 Complex (extensive) Patient / Family Education for ongoing care X- 1 10 Staff obtains Programmer, systems, Records, Test Results / Process Orders []  - 0 Staff telephones HHA, Nursing Homes / Clarify orders / etc []  - 0 Routine Transfer to another Facility (non-emergent condition) []  - 0 Routine Hospital Admission (non-emergent condition) []  - 0 New Admissions / Biomedical engineer / Ordering NPWT, Apligraf, etc. []  - 0 Emergency Hospital Admission (emergent condition) X- 1 10 Simple Discharge Coordination JAKARA, GILKERSON. ([id number]) []  - 0 Complex (extensive) Discharge Coordination PROCESS - Special Needs []  - Pediatric / Minor Patient Management 0 []  - 0 Isolation Patient Management []  - 0 Hearing / Language / Visual special needs []  - 0 Assessment of Community assistance (transportation, D/C planning, etc.) []  -  0 Additional assistance / Altered mentation []  - 0 Support Surface(s) Assessment (bed, cushion, seat, etc.) INTERVENTIONS - Wound Cleansing / Measurement X - Simple Wound Cleansing - one wound 1 5 []  -  0 Complex Wound Cleansing - multiple wounds X- 1 5 Wound Imaging (photographs - any number of wounds) []  - 0 Wound Tracing (instead of photographs) X- 1 5 Simple Wound Measurement - one wound []  - 0 Complex Wound Measurement - multiple wounds INTERVENTIONS - Wound Dressings []  - Small Wound Dressing one or multiple wounds 0 []  - 0 Medium Wound Dressing one or multiple wounds X- 1 20 Large Wound Dressing one or multiple wounds []  - 0 Application of Medications - topical []  - 0 Application of Medications - injection INTERVENTIONS - Miscellaneous []  - External ear exam 0 []  - 0 Specimen Collection (cultures, biopsies, blood, body fluids, etc.) []  - 0 Specimen(s) / Culture(s) sent or taken to Lab for analysis []  - 0 Patient Transfer (multiple staff / Civil Service fast streamer / Similar devices) []  - 0 Simple Staple / Suture removal (25 or less) []  - 0 Complex Staple / Suture removal (26 or more) []  - 0 Hypo / Hyperglycemic Management (close monitor of Blood Glucose) []  - 0 Ankle / Brachial Index (ABI) - do not check if billed separately X- 1 5 Vital Signs Rabinovich, Laquasia C. ([id number]) Has the patient been seen at the hospital within the last three years: Yes Total Score: 110 Level Of Care: New/Established - Level 3 Electronic Signature(s) Signed: 06/12/2019 5:08:51 PM By: Montey Hora Entered By: Montey Hora on 06/12/2019 15:05:46 Lietzke, [name] ([id number]) -------------------------------------------------------------------------------- Encounter Discharge Information Details Patient Name: [name]. Date of Service: 06/12/2019 2:00 PM Medical Record Number: [id number] Patient Account Number: [id number] Date of Birth/Sex: 01-17-1956 (63 y.o. F) Treating RN: Montey Hora Primary Care Kamron Vanwyhe: Tandy Gaw Other Clinician: Referring Jayln Madeira: Tandy Gaw Treating Darcel Frane/Extender: STONE III, HOYT Weeks in Treatment: 16 Encounter Discharge Information Items Discharge Condition: Stable Ambulatory Status: Cane Discharge Destination: Home Transportation: Private Auto Accompanied By: self Schedule Follow-up Appointment: Yes Clinical Summary of Care: Electronic Signature(s) Signed: 06/12/2019 5:08:51 PM By: Montey Hora Entered By: Montey Hora on 06/12/2019 15:06:45 Scrivner, [name] ([id number]) -------------------------------------------------------------------------------- Lower Extremity Assessment Details Patient Name: [name] Date of Service: 06/12/2019 2:00 PM Medical Record Number: [id number] Patient Account Number: [id number] Date of Birth/Sex: 20-Jul-1955 (63 y.o. F) Treating RN: Harold Barban Primary Care Sway Guttierrez: Tandy Gaw Other Clinician: Referring Rivan Siordia: Tandy Gaw Treating Naviah Belfield/Extender: STONE III, HOYT Weeks in Treatment: 16 Edema Assessment Assessed: [Left: No] [Right: No] Edema: [Left: Yes] [Right: Yes] Vascular Assessment Pulses: Dorsalis Pedis Palpable: [Left:Yes] [Right:Yes] Electronic Signature(s) Signed: 06/12/2019 4:16:49 PM By: Harold Barban Entered By: Harold Barban on 06/12/2019 14:20:58 Kennerson, [name] ([id number]) -------------------------------------------------------------------------------- Multi Wound Chart Details Patient Name: [name] Date of Service: 06/12/2019 2:00 PM Medical Record Number: [id number] Patient Account Number: [id number] Date of Birth/Sex: 1956/07/02 (63 y.o. F) Treating RN: Montey Hora Primary Care Avarose Mervine: Tandy Gaw Other Clinician: Referring Sayana Salley: Tandy Gaw Treating Maddix Kliewer/Extender: STONE III, HOYT Weeks in Treatment: 16 Vital Signs Height(in): 62 Pulse(bpm): 102 Weight(lbs): 390 Blood Pressure(mmHg): 129/91 Body  Mass Index(BMI): 71 Temperature(F): 98.8 Respiratory Rate 20 (breaths/min): Photos: [N/A:N/A] Wound Location: Left Lower Leg - Posterior N/A N/A Wounding Event: Gradually Appeared N/A N/A Primary Etiology: Lymphedema N/A N/A Comorbid History: Cataracts, Lymphedema, N/A N/A Asthma, Sleep Apnea, Tuberculosis, Hypertension, Gout, Osteoarthritis Date Acquired: 06/09/2019 N/A N/A Weeks of Treatment: 0 N/A N/A Wound Status: Open N/A N/A Measurements L x W x D 0.5x2x0.1 N/A N/A (cm) Area (cm) : 0.785 N/A N/A Volume (cm) : 0.079 N/A N/A Classification: Partial Thickness N/A N/A Exudate Amount: Medium N/A N/A  Exudate Type: Serous N/A N/A Exudate Color: amber N/A N/A Wound Margin: Flat and Intact N/A N/A Granulation Amount: Medium (34-66%) N/A N/A Granulation Quality: Red N/A N/A Necrotic Amount: Medium (34-66%) N/A N/A Exposed Structures: Fat Layer (Subcutaneous N/A N/A Tissue) Exposed: Yes Fascia: No Tendon: No Muscle: No Joint: No Bone: No Schueler, Brenisha C. ([id number]) Epithelialization: None N/A N/A Treatment Notes Electronic Signature(s) Signed: 06/12/2019 5:08:51 PM By: Montey Hora Entered By: Montey Hora on 06/12/2019 15:01:31 [name] ([id number]) -------------------------------------------------------------------------------- Multi-Disciplinary Care Plan Details Patient Name: [name]. Date of Service: 06/12/2019 2:00 PM Medical Record Number: [id number] Patient Account Number: [id number] Date of Birth/Sex: 09-15-1955 (63 y.o. F) Treating RN: Montey Hora Primary Care Devaney Segers: Tandy Gaw Other Clinician: Referring Rector Devonshire: Tandy Gaw Treating Shynice Sigel/Extender: STONE III, HOYT Weeks in Treatment: 16 Active Inactive Abuse / Safety / Falls / Self Care Management Nursing Diagnoses: Potential for falls Goals: Patient will remain injury free related to falls Date Initiated: 02/20/2019 Target Resolution Date: 05/16/2019 Goal Status:  Active Interventions: Assess fall risk on admission and as needed Notes: Orientation to the Wound Care Program Nursing Diagnoses: Knowledge deficit related to the wound healing center program Goals: Patient/caregiver will verbalize understanding of the [facility] Program Date Initiated: 02/20/2019 Target Resolution Date: 05/16/2019 Goal Status: Active Interventions: Provide education on orientation to the wound center Notes: Venous Leg Ulcer Nursing Diagnoses: Actual venous Insuffiency (use after diagnosis is confirmed) Goals: Patient will maintain optimal edema control Date Initiated: 02/20/2019 Target Resolution Date: 05/16/2019 Goal Status: Active Interventions: Assess peripheral edema status every visit. SACOYA, LICHTI ([id number]) Compression as ordered Notes: Wound/Skin Impairment Nursing Diagnoses: Impaired tissue integrity Goals: Ulcer/skin breakdown will heal within 14 weeks Date Initiated: 02/20/2019 Target Resolution Date: 05/16/2019 Goal Status: Active Interventions: Assess patient/caregiver ability to obtain necessary supplies Assess patient/caregiver ability to perform ulcer/skin care regimen upon admission and as needed Assess ulceration(s) every visit Notes: Electronic Signature(s) Signed: 06/12/2019 5:08:51 PM By: Montey Hora Entered By: Montey Hora on 06/12/2019 15:01:24 Silverio, [name] ([id number]) -------------------------------------------------------------------------------- Pain Assessment Details Patient Name: [name]. Date of Service: 06/12/2019 2:00 PM Medical Record Number: [id number] Patient Account Number: [id number] Date of Birth/Sex: 05-Jul-1956 (63 y.o. F) Treating RN: Montey Hora Primary Care Ivi Griffith: Tandy Gaw Other Clinician: Referring Jerah Esty: Tandy Gaw Treating Greig Altergott/Extender: STONE III, HOYT Weeks in Treatment: 16 Active Problems Location of Pain Severity and Description of Pain Patient  Has Paino No Site Locations Pain Management and Medication Current Pain Management: Electronic Signature(s) Signed: 06/12/2019 3:46:50 PM By: Paulla Fore, RRT, CHT Signed: 06/12/2019 5:08:51 PM By: Montey Hora Entered By: Lorine Bears on 06/12/2019 14:07:03 [name] ([id number]) -------------------------------------------------------------------------------- Patient/Caregiver Education Details Patient Name: [name]. Date of Service: 06/12/2019 2:00 PM Medical Record Number: [id number] Patient Account Number: [id number] Date of Birth/Gender: 06-20-1956 (63 y.o. F) Treating RN: Montey Hora Primary Care Physician: Tandy Gaw Other Clinician: Referring Physician: Tandy Gaw Treating Physician/Extender: Melburn Hake, HOYT Weeks in Treatment: 16 Education Assessment Education Provided To: Patient Education Topics Provided Venous: Handouts: Other: lymphedema management Methods: Explain/Verbal Responses: State content correctly Electronic Signature(s) Signed: 06/12/2019 5:08:51 PM By: Montey Hora Entered By: Montey Hora on 06/12/2019 [place], [name] ([id number]) -------------------------------------------------------------------------------- Wound Assessment Details Patient Name: [name] Date of Service: 06/12/2019 2:00 PM Medical Record Number: [id number] Patient Account Number: [id number] Date of Birth/Sex: 1956-01-18 (63 y.o. F) Treating RN: Harold Barban Primary Care Rajvi Armentor: Tandy Gaw Other Clinician: Referring Amarie Viles: Tandy Gaw Treating Livingston Denner/Extender:  STONE III, HOYT Weeks in Treatment: 16 Wound Status Wound Number: 6 Primary Lymphedema Etiology: Wound Location: Left Lower Leg - Posterior Wound Open Wounding Event: Gradually Appeared Status: Date Acquired: 06/09/2019 Comorbid Cataracts, Lymphedema, Asthma, Sleep Weeks Of Treatment: 0 History: Apnea, Tuberculosis,  Hypertension, Gout, Clustered Wound: No Osteoarthritis Photos Wound Measurements Length: (cm) 0.5 % Reduction in Width: (cm) 2 % Reduction in Depth: (cm) 0.1 Epithelializat Area: (cm) 0.785 Tunneling: Volume: (cm) 0.079 Undermining: Area: Volume: ion: None No No Wound Description Classification: Partial Thickness Slough/Fibrino Wound Margin: Flat and Intact Exudate Amount: Medium Exudate Type: Serous Exudate Color: amber Yes Wound Bed Granulation Amount: Medium (34-66%) Exposed Structure Granulation Quality: Red Fascia Exposed: No Necrotic Amount: Medium (34-66%) Fat Layer (Subcutaneous Tissue) Exposed: Yes Necrotic Quality: Adherent Slough Tendon Exposed: No Muscle Exposed: No Joint Exposed: No Bone Exposed: No Treatment Notes Record, Jehieli C. ([id number]) Wound #6 (Left, Posterior Lower Leg) Notes abd, kerlix and ace wrap Electronic Signature(s) Signed: 06/12/2019 4:16:49 PM By: Harold Barban Entered By: Harold Barban on 06/12/2019 14:20:44 Economos, [name] ([id number]) -------------------------------------------------------------------------------- Vitals Details Patient Name: [name]. Date of Service: 06/12/2019 2:00 PM Medical Record Number: [id number] Patient Account Number: [id number] Date of Birth/Sex: December 16, 1955 (63 y.o. F) Treating RN: Montey Hora Primary Care Seung Nidiffer: Tandy Gaw Other Clinician: Referring Wadie Mattie: Tandy Gaw Treating Breah Joa/Extender: STONE III, HOYT Weeks in Treatment: 16 Vital Signs Time Taken: 14:09 Temperature (F): 98.8 Height (in): 62 Pulse (bpm): 102 Weight (lbs): 390 Respiratory Rate (breaths/min): 20 Body Mass Index (BMI): 71.3 Blood Pressure (mmHg): 129/91 Reference Range: 80 - 120 mg / dl Electronic Signature(s) Signed: 06/12/2019 3:46:50 PM By: Lorine Bears RCP, RRT, CHT Entered By: Lorine Bears on 06/12/2019 14:10:58

## 2019-06-17 ENCOUNTER — Encounter: Payer: Medicaid Other | Admitting: Occupational Therapy

## 2019-06-19 ENCOUNTER — Other Ambulatory Visit: Payer: Self-pay

## 2019-06-19 ENCOUNTER — Encounter: Payer: Medicaid Other | Admitting: Physician Assistant

## 2019-06-19 DIAGNOSIS — E11622 Type 2 diabetes mellitus with other skin ulcer: Secondary | ICD-10-CM | POA: Diagnosis not present

## 2019-06-19 NOTE — Progress Notes (Signed)
[name], [name] ([id number]) Visit Report for 06/19/2019 Arrival Information Details Patient Name: [name]. Date of Service: 06/19/2019 9:15 AM Medical Record Number: [id number] Patient Account Number: [id number] Date of Birth/Sex: 06/12/1956 (63 y.o. F) Treating [name]: [name] Primary Care [name]: [name] Other Clinician: Referring [name]: [name] Treating Cyniah Gossard/Extender: [name], [name] in Treatment: 12 Visit Information History Since Last Visit Added or deleted any medications: No Patient Arrived: Cane Any new allergies or adverse reactions: No Arrival Time: 09:30 Had a fall or experienced change in No Accompanied By: self activities of daily living that may affect Transfer Assistance: None risk of falls: Patient Identification Verified: Yes Signs or symptoms of abuse/neglect since last visito No Secondary Verification Process Completed: Yes Hospitalized since last visit: No Patient Requires Transmission-Based Precautions: No Implantable device outside of the clinic excluding No Patient Has Alerts: No cellular tissue based products placed in the center since last visit: Has Dressing in Place as Prescribed: Yes Pain Present Now: No Electronic Signature(s) Signed: 06/19/2019 4:18:24 PM By: [name], BSN, [name], CWS, [name], BSN Entered By: [name], BSN, [name], CWS, [name] on 06/19/2019 09:31:24 [name] ([id number]) -------------------------------------------------------------------------------- Clinic Level of Care Assessment Details Patient Name: [name]. Date of Service: 06/19/2019 9:15 AM Medical Record Number: [id number] Patient Account Number: [id number] Date of Birth/Sex: 18-Nov-1955 (63 y.o. F) Treating [name]: [name] Primary Care [name]: [name] Other Clinician: Referring [name]: [name] Treating [name]: [name] [name] in Treatment: 17 Clinic Level of Care Assessment Items TOOL 4 Quantity  Score []  - Use when only an EandM is performed on FOLLOW-UP visit 0 ASSESSMENTS - Nursing Assessment / Reassessment X - Reassessment of Co-morbidities (includes updates in patient status) 1 10 X- 1 5 Reassessment of Adherence to Treatment Plan ASSESSMENTS - Wound and Skin Assessment / Reassessment X - Simple Wound Assessment / Reassessment - one wound 1 5 []  - 0 Complex Wound Assessment / Reassessment - multiple wounds []  - 0 Dermatologic / Skin Assessment (not related to wound area) ASSESSMENTS - Focused Assessment []  - Circumferential Edema Measurements - multi extremities 0 []  - 0 Nutritional Assessment / Counseling / Intervention X- 1 5 Lower Extremity Assessment (monofilament, tuning fork, pulses) []  - 0 Peripheral Arterial Disease Assessment (using hand held doppler) ASSESSMENTS - Ostomy and/or Continence Assessment and Care []  - Incontinence Assessment and Management 0 []  - 0 Ostomy Care Assessment and Management (repouching, etc.) PROCESS - Coordination of Care X - Simple Patient / Family Education for ongoing care 1 15 []  - 0 Complex (extensive) Patient / Family Education for ongoing care X- 1 10 Staff obtains Programmer, systems, Records, Test Results / Process Orders []  - 0 Staff telephones HHA, Nursing Homes / Clarify orders / etc []  - 0 Routine Transfer to another Facility (non-emergent condition) []  - 0 Routine Hospital Admission (non-emergent condition) []  - 0 New Admissions / Biomedical engineer / Ordering NPWT, Apligraf, etc. []  - 0 Emergency Hospital Admission (emergent condition) X- 1 10 Simple Discharge Coordination [name], [name]. ([id number]) []  - 0 Complex (extensive) Discharge Coordination PROCESS - Special Needs []  - Pediatric / Minor Patient Management 0 []  - 0 Isolation Patient Management []  - 0 Hearing / Language / Visual special needs []  - 0 Assessment of Community assistance (transportation, D/C planning, etc.) []  - 0 Additional  assistance / Altered mentation []  - 0 Support Surface(s) Assessment (bed, cushion, seat, etc.) INTERVENTIONS - Wound Cleansing / Measurement X - Simple Wound Cleansing - one  wound 1 5 []  - 0 Complex Wound Cleansing - multiple wounds X- 1 5 Wound Imaging (photographs - any number of wounds) []  - 0 Wound Tracing (instead of photographs) X- 1 5 Simple Wound Measurement - one wound []  - 0 Complex Wound Measurement - multiple wounds INTERVENTIONS - Wound Dressings []  - Small Wound Dressing one or multiple wounds 0 []  - 0 Medium Wound Dressing one or multiple wounds []  - 0 Large Wound Dressing one or multiple wounds []  - 0 Application of Medications - topical []  - 0 Application of Medications - injection INTERVENTIONS - Miscellaneous []  - External ear exam 0 []  - 0 Specimen Collection (cultures, biopsies, blood, body fluids, etc.) []  - 0 Specimen(s) / Culture(s) sent or taken to Lab for analysis []  - 0 Patient Transfer (multiple staff / Civil Service fast streamer / Similar devices) []  - 0 Simple Staple / Suture removal (25 or less) []  - 0 Complex Staple / Suture removal (26 or more) []  - 0 Hypo / Hyperglycemic Management (close monitor of Blood Glucose) []  - 0 Ankle / Brachial Index (ABI) - do not check if billed separately X- 1 5 Vital Signs [name], [name] ([id number]) Has the patient been seen at the hospital within the last three years: Yes Total Score: 80 Level Of Care: New/Established - Level 3 Electronic Signature(s) Signed: 06/19/2019 4:08:05 PM By: [name] Entered By: [name] on 06/19/2019 09:54:42 [name], [name] ([id number]) -------------------------------------------------------------------------------- Encounter Discharge Information Details Patient Name: [name]. Date of Service: 06/19/2019 9:15 AM Medical Record Number: [id number] Patient Account Number: [id number] Date of Birth/Sex: 02/06/1956 (63 y.o. F) Treating [name]: [name] Primary  Care [name]: [name] Other Clinician: Referring [name]: [name] Treating [name]/Extender: [name] [name] in Treatment: 17 Encounter Discharge Information Items Discharge Condition: Stable Ambulatory Status: Cane Discharge Destination: Home Transportation: Private Auto Accompanied By: self Schedule Follow-up Appointment: No Clinical Summary of Care: Electronic Signature(s) Signed: 06/19/2019 9:55:28 AM By: [name] Entered By: [name] on 06/19/2019 09:55:28 Caperton, [name] ([id number]) -------------------------------------------------------------------------------- Lower Extremity Assessment Details Patient Name: [name]. Date of Service: 06/19/2019 9:15 AM Medical Record Number: [id number] Patient Account Number: [id number] Date of Birth/Sex: Nov 11, 1955 (63 y.o. F) Treating [name]: [name] Primary Care Jeslin Bazinet: [name] Other Clinician: Referring Sarabella Caprio: [name] Treating Kentravious Lipford/Extender: [name], [name] in Treatment: 17 Vascular Assessment Pulses: Dorsalis Pedis Palpable: [Left:Yes] [Right:Yes] Electronic Signature(s) Signed: 06/19/2019 4:18:24 PM By: [name], BSN, [name], CWS, [name], BSN Entered By: [name], BSN, [name], CWS, [name] on 06/19/2019 09:38:32 [name] ([id number]) -------------------------------------------------------------------------------- Multi Wound Chart Details Patient Name: [name]. Date of Service: 06/19/2019 9:15 AM Medical Record Number: [id number] Patient Account Number: [id number] Date of Birth/Sex: 1955/12/21 (63 y.o. F) Treating [name]: [name] Primary Care Azelia Reiger: [name] Other Clinician: Referring Colisha Redler: [name] Treating Quy Lotts/Extender: [name] [name] in Treatment: 17 Vital Signs Height(in): 62 Pulse(bpm): 104 Weight(lbs): 390 Blood Pressure(mmHg): 187/96 Body Mass Index(BMI): 71 Temperature(F): 98.7 Respiratory  Rate 20 (breaths/min): Photos: [6:No Photos] [N/A:N/A] Wound Location: [6:Left, Posterior Lower Leg] [N/A:N/A] Wounding Event: [6:Gradually Appeared] [N/A:N/A] Primary Etiology: [6:Lymphedema] [N/A:N/A] Date Acquired: [6:06/09/2019] [N/A:N/A] [name] of Treatment: [6:1] [N/A:N/A] Wound Status: [6:Healed - Epithelialized] [N/A:N/A] Measurements L x W x D [6:0x0x0] [N/A:N/A] (cm) Area (cm) : [6:0] [N/A:N/A] Volume (cm) : [6:0] [N/A:N/A] % Reduction in Area: [6:100.00%] [N/A:N/A] % Reduction in Volume: [6:100.00% Partial Thickness] [N/A:N/A N/A] Treatment Notes Electronic Signature(s) Signed: 06/19/2019 4:08:05 PM By: [name] Entered By:  [name] on 06/19/2019 09:42:59 NARALY, GOODLOE ([id number]) -------------------------------------------------------------------------------- Multi-Disciplinary Care Plan Details Patient Name: BRANTLEE, ZEIER. Date of Service: 06/19/2019 9:15 AM Medical Record Number: [id number] Patient Account Number: [id number] Date of Birth/Sex: October 05, 1955 (63 y.o. F) Treating [name]: [name] Primary Care Titiana Severa: [name] Other Clinician: Referring Naol Ontiveros: [name] Treating Eli Pattillo/Extender: [name], [name] in Treatment: 17 Active Inactive Electronic Signature(s) Signed: 06/19/2019 4:08:05 PM By: [name] Entered By: [name] on 06/19/2019 09:42:49 Osley, [name] ([id number]) -------------------------------------------------------------------------------- Pain Assessment Details Patient Name: [name]. Date of Service: 06/19/2019 9:15 AM Medical Record Number: [id number] Patient Account Number: [id number] Date of Birth/Sex: 02/15/1956 (63 y.o. F) Treating [name]: [name] Primary Care Adriel Desrosier: [name] Other Clinician: Referring Reha Martinovich: [name] Treating Renan Danese/Extender: [name], [name] in Treatment: 17 Active Problems Location of Pain Severity and Description of Pain Patient  Has Paino No Site Locations Pain Management and Medication Current Pain Management: Electronic Signature(s) Signed: 06/19/2019 4:18:24 PM By: [name], BSN, [name], CWS, [name], BSN Entered By: [name], BSN, [name], CWS, [name] on 06/19/2019 09:31:35 [name] ([id number]) -------------------------------------------------------------------------------- Patient/Caregiver Education Details Patient Name: [name] Date of Service: 06/19/2019 9:15 AM Medical Record Number: [id number] Patient Account Number: [id number] Date of Birth/Gender: 1956/06/18 (63 y.o. F) Treating [name]: [name] Primary Care Physician: [name] Other Clinician: Referring Physician: Tandy [name] Treating Physician/Extender: [name], [name] in Treatment: 17 Education Assessment Education Provided To: Patient Education Topics Provided Venous: Handouts: Other: continued lymphedema management Methods: Explain/Verbal Responses: State content correctly Electronic Signature(s) Signed: 06/19/2019 4:08:05 PM By: [name] Entered By: [name] on 06/19/2019 09:55:13 Bogan, [name] ([id number]) -------------------------------------------------------------------------------- Wound Assessment Details Patient Name: [name] Date of Service: 06/19/2019 9:15 AM Medical Record Number: [id number] Patient Account Number: [id number] Date of Birth/Sex: [birth year] (63 y.o. F) Treating [name]: [name] Primary Care Jaceion Aday: [name] Other Clinician: Referring Ames Hoban: [name] Treating Lonna Rabold/Extender: [name] [name] in Treatment: 17 Wound Status Wound Number: 6 Primary Etiology: Lymphedema Wound Location: Left, Posterior Lower Leg Wound Status: Healed - Epithelialized Wounding Event: Gradually Appeared Date Acquired: 06/09/2019 [name] Of Treatment: 1 Clustered Wound: No Wound Measurements Length: (cm) 0 Width: (cm) 0 Depth: (cm) 0 Area: (cm) 0 Volume: (cm) 0 %  Reduction in Area: 100% % Reduction in Volume: 100% Wound Description Classification: Partial Thickness Electronic Signature(s) Signed: 06/19/2019 4:18:24 PM By: [name], BSN, [name], CWS, [name], BSN Entered By: [name], BSN, [name], CWS, [name] on 06/19/2019 09:38:11 Mclees, [name] ([id number]) -------------------------------------------------------------------------------- Vitals Details Patient Name: [name]. Date of Service: 06/19/2019 9:15 AM Medical Record Number: [id number] Patient Account Number: [id number] Date of Birth/Sex: [birth year] (63 y.o. F) Treating [name]: [name] Primary Care Riva Sesma: [name] Other Clinician: Referring Demontay Grantham: [name] Treating Emmalene Kattner/Extender: [name] [name] in Treatment: 17 Vital Signs Time Taken: 09:31 Temperature (F): 98.7 Height (in): 62 Pulse (bpm): 104 Weight (lbs): 390 Respiratory Rate (breaths/min): 20 Body Mass Index (BMI): 71.3 Blood Pressure (mmHg): 187/96 Reference Range: 80 - 120 mg / dl Electronic Signature(s) Signed: 06/19/2019 4:18:24 PM By: [name], BSN, [name], CWS, [name], BSN Entered By: [name], BSN, [name], CWS, [name] on 06/19/2019 09:33:53

## 2019-06-19 NOTE — Progress Notes (Addendum)
CANDEE, HEIDORN ([id number]) Visit Report for 06/19/2019 Chief Complaint Document Details Patient Name: [name], [name]. Date of Service: 06/19/2019 9:15 AM Medical Record Number: [id number] Patient Account Number: [id number] Date of Birth/Sex: 07-31-1955 (63 y.o. F) Treating RN: Montey Hora Primary Care Provider: Tandy Gaw Other Clinician: Referring Provider: Tandy Gaw Treating Provider/Extender: Melburn Hake, Niema Carrara Weeks in Treatment: 17 Information Obtained from: Patient Chief Complaint Bilateral LE lymphedema Electronic Signature(s) Signed: 06/19/2019 9:40:01 AM By: Worthy Keeler PA-C Entered By: Worthy Keeler on 06/19/2019 09:40:00 [name], [name] ([id number]) -------------------------------------------------------------------------------- HPI Details Patient Name: [name]. Date of Service: 06/19/2019 9:15 AM Medical Record Number: [id number] Patient Account Number: [id number] Date of Birth/Sex: Oct 26, 1955 (63 y.o. F) Treating RN: Montey Hora Primary Care Provider: Tandy Gaw Other Clinician: Referring Provider: Tandy Gaw Treating Provider/Extender: STONE III, Cameryn Schum Weeks in Treatment: 17 History of Present Illness HPI Description: The patient is a 63 year old female with history of hypertension and a long-standing history of bilateral lower extremity lymphedema (first presented on 4/2) . She has had open ulcers in the past which have always responded to compression therapy. She had briefly been to a lymphedema clinic in the past which helped her at the time. this time around she stopped treatment of her lymphedema pumps approximately 2 weeks ago because of some pain in the knees and then noticed the right leg getting worse. She was seen by her PCP who put her on clindamycin 4 times a day 2 days ago. The patient has seen [facility] and Dr. Delana Meyer had seen her last year where a vascular study including venous and arterial duplex studies were within  normal limits. he had recommended compression stockings and lymphedema pumps and the patient has been using this in about 2 weeks ago. She is known to be diabetic but in the past few time she's gone to her primary care doctor her hemoglobin A1c has been normal. 02/11/2015 - after her last visit she took my advice and went to the ER regarding the progressive cellulitis of her right lower extremity and she was admitted between July 17 and 22nd. She received IV antibiotics and then was sent home on a course of steroid-induced and oral antibiotics. She has improved much since then. 02/17/2015 -- she has been doing fine and the weeping of her legs has remarkably gone down. She has no fresh issues. READMISSION 01/15/18 This patient was given this clinic before most recently in 2016 seen by Dr. Con Memos. She has massive bilateral lymphedema and over the last 2 months this had weeping edema out of the left leg. She has compression pumps but her compliance with these has been minimal. She has [facility] they've been using TCA/ABDs/kerlix under an Ace wrap.she has had recent problems with cellulitis. She was apparently seen in the ER and 12/23/17 and given clindamycin. She was then followed by her primary doctor and given doxycycline and Keflex. The pain seems to have settled down. In April 2018 the patient had arterial studies done at [facility]. This showed triphasic waveforms throughout the right leg and mostly triphasic waveforms on the left except for monophasic at the posterior tibial artery distally. She was not felt to have evidence of right lower extremity arterial stenosis or significant problems on the left side. 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 She also had a right lower extremity venous Doppler in January 2018 this was limited by the patient's body habitus and lymphedema. Most of the  proximal veins were not visualized The patient presents  with an area of denuded skin on the anterior medial part of the left calf. There is weeping edema fluid here. 01/22/18; the patient has somewhat better edema control using her compression pumps twice a day and as a result she has much better epithelialization on the left anterior calf area. Only a small open area remains. 01/29/18; the patient has been compliant with her compression pumps. Both the areas on her calf that healed. The remaining area on the left anterior leg is fully epithelialized Readmission: 02/20/2019 upon evaluation today patient presents for reevaluation due to issues that she is having with the bilateral lower extremities. She actually has wounds open on both legs. On the right she has an area in the crease of her leg on the right around the knee region which is actually draining quite a bit and actually has some fungal type appearance to it. She has been on nystatin powder that seems to have helped to some degree. In regard to the left lower extremity this is actually in the [place]. ([id number]) lower portion of her leg closer to the ankle and again is continuing to drain as well unfortunately. There does not appear to be any signs of active infection at this time which is good news. No fevers, chills, nausea, vomiting, or diarrhea. She tells me that since she was seen last year she is actually been doing quite well for the most part with regard to her lower extremities. Unfortunately she now is experiencing a little bit more drainage at this time. She is concerned about getting this under control so that it does not get significantly worse. 02/27/2019 on evaluation today patient appears to be doing somewhat better in regard to her bilateral lower extremity wounds. She has been tolerating the dressing changes without complication. Fortunately there is no signs of active infection at this point. No fevers, chills, nausea, vomiting, or diarrhea. She did get her dressing  supplies which is excellent news she was extremely excited to get these. She also got paperwork from [employer] for their financial assistance program where they may be able to help her out in the future if needed with supplies at discounted prices. 03/06/2019 on evaluation today patient appears to be doing a little worse with regard to both areas of weeping on her bilateral lower extremities. This is around the right medial knee and just above the left ankle. With that being said she is unfortunately not doing as well as I would like to see. I feel like she may need to potentially go see someone at the lymphedema clinic as the wraps that she needs or even beyond what we can do here at the wound care center. She really does not have wounds she just has open areas of weeping that are causing some difficulty for her. Subsequently because of this and the moisture I am concerned about the potential for infection I am going to likely give her a prophylactic antibiotic today, Keflex, just to be on the safe side. Nonetheless again there is no obvious signs of active infection at this time. 03/13/2019 on evaluation today patient appears to be doing well with regard to her bilateral lower extremities where she has been weeping compared to even last week's evaluation. I see some areas of new skin growth which is excellent and overall I am very pleased with how things seem to be progressing. No fevers, chills, nausea, vomiting, or diarrhea. 03/20/2019 on evaluation  today patient unfortunately is continuing to have issues with significant edema of the left lower extremity. Her right side seems to be doing much better. Unfortunately her left side is showing increased weeping of the lower portion of her leg. This is quite unfortunate obviously we were hoping to get her into the lymphedema clinic they really do not seem to when I see her how if she is draining. Despite the fact this is really not wound related but more  lymphedema weeping related. Nonetheless I do not know that this can be helpful for her to even go for that appointment since again I am not sure there is much that they would actually do at this point. We may need to try a 4 layer compression wrap as best we can on her leg. She is on the Augmentin currently although I am still concerned about whether or not there could be potentially something going on infection wise I would obtain a culture though I understand is not the best being that is a surface culture I just 1 to make sure I do not seem to be missing anything. 03/27/2019 on evaluation today patient appears to be doing much better in regard to the left lower extremity compared to last week. Last week she had tremendous weeping which I think was subsequent to infection now she seems to be doing much better and very pleased. This is not completely healed but there is a lot of new skin growth and it has dried out quite a bit. Overall I think that we are doing well with how things are moving along at this time. No fevers, chills, nausea, vomiting, or diarrhea. 04/03/2019 evaluation today patient appears to be doing a little worse this week compared to last time I saw her. I think this may be due to the fact that she is having issues with not being able to sleep in her bed at least not until last night. She is therefore been in a lift chair and subsequently has also had issues with not been able to use her pumps since she could not get in bed. With that being said the patient overall seems to be doing okay I do think I may want extend the antibiotic for a little bit longer at least until we can see if her edema and her weeping gets better and if it is then obviously I can always discontinue the antibiotics as of next week however I want her to continue to have it over the next week. 04/10/2019 on evaluation today patient unfortunately is still doing poorly with regard to her left lower extremity. Her  right is all things considering doing fairly well. On the left however she continues to have spreading of the area of infection and weeping which appears to be even a larger surface area than noted last week. She did have a positive culture for Pseudomonas in particular which seems to have been of concern she still has green/yellow discharge consistent with Pseudomonas and subsequently a tremendous amount of it. This has me obviously still concerned about the infection not really clearing up despite the fact that on culture it appears the Cipro should have been a good option for treating this. I think she may at this point need IV antibiotics since things are not doing better I do not want to get worse and cause sepsis. She is in agreement with the plan and believes as well that she likely does need to go to the hospital for  IV vancomycin. Or something of the like depending on what the recommendation is from the ER. 04/17/2019 on evaluation today patient appears to be doing excellent in regard to her lower extremity on the left. She was in the hospital for several days from when I sent her last we saw her until just this past Tuesday. Fortunately her drainage is significantly improved and in fact is mostly clear. There is just a couple small areas that may still drain a little bit she states [name], [name]. ([id number]) that the [facility] they prescribed for her at discharge she went picked up from pharmacy and got home but has not been able to find it since. She is looked everywhere. She is wondering if I will replace that for her today I will be more than happy to do that. 05/01/2019 on evaluation today patient actually appears to be doing quite well with regard to her lower extremities. She occasionally is having areas that will leak and then heal up mainly when a piece of the fibrotic skin pops off but fortunately she is not having any signs of active infection at this time. Overall she also  really does not have any obvious weeping at this time. I do believe however she really needs some compression wraps and I think this may be a good time to get her back to the lymphedema clinic. 05/11/2019 on evaluation today patient actually appears to be doing quite well with regard to her bilateral lower extremities. She occasionally will have a small area that we per another but in general seems to be completely healed which is great news. Overall very pleased with how everything seems to be progressing. She does have her appointment with lymphedema clinic on November 18. 05/25/2019 on evaluation today patient appears to be doing well with regard to her left lower extremity. I am very pleased in this regard. In regard to her right leg this actually did start draining more I think it is mainly due to the fact that her leg is more swollen. I am not seeing any obvious signs of infection at this time although that is definitely something were obviously acutely aware of simply due to the fact that she had an issue not too far back with exactly this issue. Nonetheless I do feel like that lymphedema clinic would still be beneficial for her. I explained obviously if they are not able to do anything treatment wise on the right leg we could at least have them treat her left leg and then proceed from there. The patient is really in agreement with that plan. If they are able to do both as the drainage slows down that I would be happy to let them handle both. 06/01/2019 on evaluation today patient unfortunately appears to be doing worse with regard to her right lower extremity. The left lower extremity is still maintaining at this point. Unfortunately she has been having significantly increased pain over the past several days and has been experiencing as well increased swelling of the right lower extremity. I really do not know that I am seeing anything that appears to be obvious for infection at this point to  be peripherally honest. With that being said the patient does seem to be having much more swelling that she is even experienced in the past and coupled with increased pain in her hip as well I am concerned that again she could potentially have a DVT although I am not 100% sure of this. I think it something  that may need to be checked out. We discussed the possibility of sending her for a DVT study through the hospital but unfortunately transportation is an issue if she does have a DVT I do not want her to wait days to be able to get in for that test however if she has this scheduled as an outpatient that is as fast that she will be able to get the test scheduled for transportation purposes. That will also fall on Thanksgiving so subsequently she did actually be looking at either Friday or even next week before we would know anything back from this. That is much too long in my opinion. Subsequent to the amount of discomfort she is experiencing the patient is actually okay with going to the ER for evaluation today. 06/12/2019 on evaluation today patient actually appears to be doing significantly better compared to last time I saw her. Following when I last saw her she was actually in the hospital from that Monday until the following Sunday almost 1 full week. She actually was placed on Keflex in the hospital following the time for her to be discharged and Dr. Steva Ready has recommended 2 times a day dosing of the Keflex for the next year in order to help with more prophylactic/preventative measures with regard to her developing cellulitis. Overall I think this sounds like an excellent plan. The patient unfortunately is good to have trouble being treated at lymphedema clinic due to the fact that she really cannot get up on the bed that they have there. They also state that they cannot manage her as long as she has anything draining at this point. Obviously that is somewhat unfortunate as she does need  help with edema control but nonetheless we will have to do what we can for her outside of it sounds like the lymphedema clinic scenario at this point. 06/19/2019 on evaluation today patient appears to be doing fairly well with regard to her bilateral lower extremities. She is not nearly as swollen and shows no signs of infection at this point. There is no evidence of cellulitis whatsoever. She also has no open wounds or draining at this point which is also good news. No fever chills noted. She seems to be in very good spirits and in fact appears to be doing quite well. Electronic Signature(s) Signed: 06/19/2019 9:52:54 AM By: Worthy Keeler PA-C Entered By: Worthy Keeler on 06/19/2019 09:52:53 [name], [name] ([id number]) -------------------------------------------------------------------------------- Physical Exam Details Patient Name: [name], [name] Date of Service: 06/19/2019 9:15 AM Medical Record Number: [id number] Patient Account Number: [id number] Date of Birth/Sex: [birth year] (63 y.o. F) Treating RN: Montey Hora Primary Care Provider: Tandy Gaw Other Clinician: Referring Provider: Tandy Gaw Treating Provider/Extender: STONE III, Caley Ciaramitaro Weeks in Treatment: 17 Constitutional Obese and well-hydrated in no acute distress. Respiratory normal breathing without difficulty. clear to auscultation bilaterally. Cardiovascular regular rate and rhythm with normal S1, S2. Psychiatric this patient is able to make decisions and demonstrates good insight into disease process. Alert and Oriented x 3. pleasant and cooperative. Notes Patient's wound bed currently showed signs of being completely healed I see no evidence of edema or drainage at this point. There is no signs of active infection which is also good news. Electronic Signature(s) Signed: 06/19/2019 9:56:22 AM By: Worthy Keeler PA-C Entered By: Worthy Keeler on 06/19/2019 09:56:22 Cu[place], [name]  ([id number]) -------------------------------------------------------------------------------- Physician Orders Details Patient Name: [name] Date of Service: 06/19/2019 9:15 AM Medical Record Number: [id number] Patient  Account Number: [id number] Date of Birth/Sex: [birth year] (63 y.o. F) Treating RN: Montey Hora Primary Care Provider: Tandy Gaw Other Clinician: Referring Provider: Tandy Gaw Treating Provider/Extender: STONE III, Flara Storti Weeks in Treatment: 17 Verbal / Phone Orders: No Diagnosis Coding ICD-10 Coding Code Description I89.0 Lymphedema, not elsewhere classified L97.822 Non-pressure chronic ulcer of other part of left lower leg with fat layer exposed L97.812 Non-pressure chronic ulcer of other part of right lower leg with fat layer exposed Discharge From [facility] Services o Discharge from [facility] Signature(s) Signed: 06/19/2019 11:50:39 AM By: Worthy Keeler PA-C Signed: 06/19/2019 4:08:05 PM By: Montey Hora Entered By: Montey Hora on 06/19/2019 09:48:07 Colledge, [name] ([id number]) -------------------------------------------------------------------------------- Problem List Details Patient Name: [name] Date of Service: 06/19/2019 9:15 AM Medical Record Number: [id number] Patient Account Number: [id number] Date of Birth/Sex: December 29, 1955 (63 y.o. F) Treating RN: Montey Hora Primary Care Provider: Tandy Gaw Other Clinician: Referring Provider: Tandy Gaw Treating Provider/Extender: STONE III, Athziri Freundlich Weeks in Treatment: 17 Active Problems ICD-10 Evaluated Encounter Code Description Active Date Today Diagnosis I89.0 Lymphedema, not elsewhere classified 02/20/2019 No Yes L97.822 Non-pressure chronic ulcer of other part of left lower leg with 02/20/2019 No Yes fat layer exposed L97.812 Non-pressure chronic ulcer of other part of right lower leg 02/20/2019 No Yes with fat layer exposed Inactive Problems Resolved  Problems Electronic Signature(s) Signed: 06/19/2019 9:39:53 AM By: Worthy Keeler PA-C Entered By: Worthy Keeler on 06/19/2019 09:39:52 Sternberg, [name] ([id number]) -------------------------------------------------------------------------------- Progress Note Details Patient Name: [name]. Date of Service: 06/19/2019 9:15 AM Medical Record Number: [id number] Patient Account Number: [id number] Date of Birth/Sex: Sep 23, 1955 (63 y.o. F) Treating RN: Montey Hora Primary Care Provider: Tandy Gaw Other Clinician: Referring Provider: Tandy Gaw Treating Provider/Extender: STONE III, Auburn Hester Weeks in Treatment: 17 Subjective Chief Complaint Information obtained from Patient Bilateral LE lymphedema History of Present Illness (HPI) The patient is a 63 year old female with history of hypertension and a long-standing history of bilateral lower extremity lymphedema (first presented on 4/2) . She has had open ulcers in the past which have always responded to compression therapy. She had briefly been to a lymphedema clinic in the past which helped her at the time. this time around she stopped treatment of her lymphedema pumps approximately 2 weeks ago because of some pain in the knees and then noticed the right leg getting worse. She was seen by her PCP who put her on clindamycin 4 times a day 2 days ago. The patient has seen [facility] and Dr. Delana Meyer had seen her last year where a vascular study including venous and arterial duplex studies were within normal limits. he had recommended compression stockings and lymphedema pumps and the patient has been using this in about 2 weeks ago. She is known to be diabetic but in the past few time she's gone to her primary care doctor her hemoglobin A1c has been normal. 02/11/2015 - after her last visit she took my advice and went to the ER regarding the progressive cellulitis of her right lower extremity and she was admitted between July 17 and  22nd. She received IV antibiotics and then was sent home on a course of steroid-induced and oral antibiotics. She has improved much since then. 02/17/2015 -- she has been doing fine and the weeping of her legs has remarkably gone down. She has no fresh issues. READMISSION 01/15/18 This patient was given this clinic before most recently in 2016 seen by Dr. Con Memos. She has massive bilateral  lymphedema and over the last 2 months this had weeping edema out of the left leg. She has compression pumps but her compliance with these has been minimal. She has [facility] they've been using TCA/ABDs/kerlix under an Ace wrap.she has had recent problems with cellulitis. She was apparently seen in the ER and 12/23/17 and given clindamycin. She was then followed by her primary doctor and given doxycycline and Keflex. The pain seems to have settled down. In April 2018 the patient had arterial studies done at [facility]. This showed triphasic waveforms throughout the right leg and mostly triphasic waveforms on the left except for monophasic at the posterior tibial artery distally. She was not felt to have evidence of right lower extremity arterial stenosis or significant problems on the left side. 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 She also had a right lower extremity venous Doppler in January 2018 this was limited by the patient's body habitus and lymphedema. Most of the proximal veins were not visualized The patient presents with an area of denuded skin on the anterior medial part of the left calf. There is weeping edema fluid here. 01/22/18; the patient has somewhat better edema control using her compression pumps twice a day and as a result she has much better epithelialization on the left anterior calf area. Only a small open area remains. 01/29/18; the patient has been compliant with her compression pumps. Both the areas on her calf that healed. The  remaining area on the left anterior leg is fully epithelialized [name], [name]. ([id number]) Readmission: 02/20/2019 upon evaluation today patient presents for reevaluation due to issues that she is having with the bilateral lower extremities. She actually has wounds open on both legs. On the right she has an area in the crease of her leg on the right around the knee region which is actually draining quite a bit and actually has some fungal type appearance to it. She has been on nystatin powder that seems to have helped to some degree. In regard to the left lower extremity this is actually in the lower portion of her leg closer to the ankle and again is continuing to drain as well unfortunately. There does not appear to be any signs of active infection at this time which is good news. No fevers, chills, nausea, vomiting, or diarrhea. She tells me that since she was seen last year she is actually been doing quite well for the most part with regard to her lower extremities. Unfortunately she now is experiencing a little bit more drainage at this time. She is concerned about getting this under control so that it does not get significantly worse. 02/27/2019 on evaluation today patient appears to be doing somewhat better in regard to her bilateral lower extremity wounds. She has been tolerating the dressing changes without complication. Fortunately there is no signs of active infection at this point. No fevers, chills, nausea, vomiting, or diarrhea. She did get her dressing supplies which is excellent news she was extremely excited to get these. She also got paperwork from [employer] for their financial assistance program where they may be able to help her out in the future if needed with supplies at discounted prices. 03/06/2019 on evaluation today patient appears to be doing a little worse with regard to both areas of weeping on her bilateral lower extremities. This is around the right medial knee and just  above the left ankle. With that being said she is unfortunately not  doing as well as I would like to see. I feel like she may need to potentially go see someone at the lymphedema clinic as the wraps that she needs or even beyond what we can do here at the wound care center. She really does not have wounds she just has open areas of weeping that are causing some difficulty for her. Subsequently because of this and the moisture I am concerned about the potential for infection I am going to likely give her a prophylactic antibiotic today, Keflex, just to be on the safe side. Nonetheless again there is no obvious signs of active infection at this time. 03/13/2019 on evaluation today patient appears to be doing well with regard to her bilateral lower extremities where she has been weeping compared to even last week's evaluation. I see some areas of new skin growth which is excellent and overall I am very pleased with how things seem to be progressing. No fevers, chills, nausea, vomiting, or diarrhea. 03/20/2019 on evaluation today patient unfortunately is continuing to have issues with significant edema of the left lower extremity. Her right side seems to be doing much better. Unfortunately her left side is showing increased weeping of the lower portion of her leg. This is quite unfortunate obviously we were hoping to get her into the lymphedema clinic they really do not seem to when I see her how if she is draining. Despite the fact this is really not wound related but more lymphedema weeping related. Nonetheless I do not know that this can be helpful for her to even go for that appointment since again I am not sure there is much that they would actually do at this point. We may need to try a 4 layer compression wrap as best we can on her leg. She is on the Augmentin currently although I am still concerned about whether or not there could be potentially something going on infection wise I would obtain a  culture though I understand is not the best being that is a surface culture I just 1 to make sure I do not seem to be missing anything. 03/27/2019 on evaluation today patient appears to be doing much better in regard to the left lower extremity compared to last week. Last week she had tremendous weeping which I think was subsequent to infection now she seems to be doing much better and very pleased. This is not completely healed but there is a lot of new skin growth and it has dried out quite a bit. Overall I think that we are doing well with how things are moving along at this time. No fevers, chills, nausea, vomiting, or diarrhea. 04/03/2019 evaluation today patient appears to be doing a little worse this week compared to last time I saw her. I think this may be due to the fact that she is having issues with not being able to sleep in her bed at least not until last night. She is therefore been in a lift chair and subsequently has also had issues with not been able to use her pumps since she could not get in bed. With that being said the patient overall seems to be doing okay I do think I may want extend the antibiotic for a little bit longer at least until we can see if her edema and her weeping gets better and if it is then obviously I can always discontinue the antibiotics as of next week however I want her to continue to have it  over the next week. 04/10/2019 on evaluation today patient unfortunately is still doing poorly with regard to her left lower extremity. Her right is all things considering doing fairly well. On the left however she continues to have spreading of the area of infection and weeping which appears to be even a larger surface area than noted last week. She did have a positive culture for Pseudomonas in particular which seems to have been of concern she still has green/yellow discharge consistent with Pseudomonas and subsequently a tremendous amount of it. This has me obviously  still concerned about the infection not really clearing up despite the fact that on culture it appears the Cipro should have been a good option for treating this. I think she may at this point need IV antibiotics since things are not doing better I do not want to get worse and cause sepsis. She is in agreement [name], [name]. ([id number]) with the plan and believes as well that she likely does need to go to the hospital for IV vancomycin. Or something of the like depending on what the recommendation is from the ER. 04/17/2019 on evaluation today patient appears to be doing excellent in regard to her lower extremity on the left. She was in the hospital for several days from when I sent her last we saw her until just this past Tuesday. Fortunately her drainage is significantly improved and in fact is mostly clear. There is just a couple small areas that may still drain a little bit she states that the [facility] they prescribed for her at discharge she went picked up from pharmacy and got home but has not been able to find it since. She is looked everywhere. She is wondering if I will replace that for her today I will be more than happy to do that. 05/01/2019 on evaluation today patient actually appears to be doing quite well with regard to her lower extremities. She occasionally is having areas that will leak and then heal up mainly when a piece of the fibrotic skin pops off but fortunately she is not having any signs of active infection at this time. Overall she also really does not have any obvious weeping at this time. I do believe however she really needs some compression wraps and I think this may be a good time to get her back to the lymphedema clinic. 05/11/2019 on evaluation today patient actually appears to be doing quite well with regard to her bilateral lower extremities. She occasionally will have a small area that we per another but in general seems to be completely healed which is  great news. Overall very pleased with how everything seems to be progressing. She does have her appointment with lymphedema clinic on November 18. 05/25/2019 on evaluation today patient appears to be doing well with regard to her left lower extremity. I am very pleased in this regard. In regard to her right leg this actually did start draining more I think it is mainly due to the fact that her leg is more swollen. I am not seeing any obvious signs of infection at this time although that is definitely something were obviously acutely aware of simply due to the fact that she had an issue not too far back with exactly this issue. Nonetheless I do feel like that lymphedema clinic would still be beneficial for her. I explained obviously if they are not able to do anything treatment wise on the right leg we could at least have them treat her  left leg and then proceed from there. The patient is really in agreement with that plan. If they are able to do both as the drainage slows down that I would be happy to let them handle both. 06/01/2019 on evaluation today patient unfortunately appears to be doing worse with regard to her right lower extremity. The left lower extremity is still maintaining at this point. Unfortunately she has been having significantly increased pain over the past several days and has been experiencing as well increased swelling of the right lower extremity. I really do not know that I am seeing anything that appears to be obvious for infection at this point to be peripherally honest. With that being said the patient does seem to be having much more swelling that she is even experienced in the past and coupled with increased pain in her hip as well I am concerned that again she could potentially have a DVT although I am not 100% sure of this. I think it something that may need to be checked out. We discussed the possibility of sending her for a DVT study through the hospital but  unfortunately transportation is an issue if she does have a DVT I do not want her to wait days to be able to get in for that test however if she has this scheduled as an outpatient that is as fast that she will be able to get the test scheduled for transportation purposes. That will also fall on Thanksgiving so subsequently she did actually be looking at either Friday or even next week before we would know anything back from this. That is much too long in my opinion. Subsequent to the amount of discomfort she is experiencing the patient is actually okay with going to the ER for evaluation today. 06/12/2019 on evaluation today patient actually appears to be doing significantly better compared to last time I saw her. Following when I last saw her she was actually in the hospital from that Monday until the following Sunday almost 1 full week. She actually was placed on Keflex in the hospital following the time for her to be discharged and Dr. Steva Ready has recommended 2 times a day dosing of the Keflex for the next year in order to help with more prophylactic/preventative measures with regard to her developing cellulitis. Overall I think this sounds like an excellent plan. The patient unfortunately is good to have trouble being treated at lymphedema clinic due to the fact that she really cannot get up on the bed that they have there. They also state that they cannot manage her as long as she has anything draining at this point. Obviously that is somewhat unfortunate as she does need help with edema control but nonetheless we will have to do what we can for her outside of it sounds like the lymphedema clinic scenario at this point. 06/19/2019 on evaluation today patient appears to be doing fairly well with regard to her bilateral lower extremities. She is not nearly as swollen and shows no signs of infection at this point. There is no evidence of cellulitis whatsoever. She also has no open wounds or  draining at this point which is also good news. No fever chills noted. She seems to be in very good spirits and in fact appears to be doing quite well. Patient History Information obtained from Patient. [name], [name] ([id number]) Family History Cancer - Father, Diabetes - Father, Heart Disease - Mother, Hypertension - Mother,Father, Seizures - Mother, Stroke - Mother, Thyroid  Problems - Mother,Siblings, No family history of Hereditary Spherocytosis, Kidney Disease, Lung Disease, Tuberculosis. Social History Former smoker - uses snuff, Marital Status - Single, Alcohol Use - Rarely, Drug Use - No History, Caffeine Use - Daily. 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 Denies history of Aspiration, Chronic Obstructive Pulmonary Disease (COPD), Pneumothorax Cardiovascular Patient has history of Hypertension Denies history of Angina, Arrhythmia, Congestive Heart Failure, Coronary Artery Disease, Deep Vein Thrombosis, Hypotension, Myocardial Infarction, Peripheral Arterial Disease, Peripheral Venous Disease, Phlebitis, Vasculitis Gastrointestinal Denies history of Cirrhosis , Colitis, Crohn s, Hepatitis A, Hepatitis B, Hepatitis C Endocrine Denies history of Type I Diabetes, Type II Diabetes Genitourinary Denies history of End Stage Renal Disease Immunological Denies history of Lupus Erythematosus, Raynaud s, Scleroderma Integumentary (Skin) Denies history of History of Burn, History of pressure wounds Musculoskeletal Patient has history of Gout, Osteoarthritis Denies history  of Rheumatoid Arthritis, Osteomyelitis Neurologic Denies history of Dementia, Neuropathy, Quadriplegia, Paraplegia, Seizure Disorder Oncologic Denies history of Received Chemotherapy, Received Radiation Psychiatric Denies history of Anorexia/bulimia, Confinement Anxiety Review of Systems (ROS) Constitutional Symptoms (General Health) Denies complaints or symptoms of Fatigue, Fever, Chills, Marked Weight Change. Respiratory Denies complaints or symptoms of Chronic or frequent coughs, Shortness of Breath. Cardiovascular Complains or has symptoms of LE edema. Denies complaints or symptoms of Chest pain. Psychiatric Denies complaints or symptoms of Anxiety, Claustrophobia. [name], [name] ([id number]) Objective Constitutional Obese and well-hydrated in no acute distress. Vitals Time Taken: 9:31 AM, Height: 62 in, Weight: 390 lbs, BMI: 71.3, Temperature: 98.7 F, Pulse: 104 bpm, Respiratory Rate: 20 breaths/min, Blood Pressure: 187/96 mmHg. Respiratory normal breathing without difficulty. clear to auscultation bilaterally. Cardiovascular regular rate and rhythm with normal S1, S2. Psychiatric this patient is able to make decisions and demonstrates good insight into disease process. Alert and Oriented x 3. pleasant and cooperative. General Notes: Patient's wound bed currently showed signs of being completely healed I see no evidence of edema or drainage at this point. There is no signs of active infection which is also good news. Integumentary (Hair, Skin) Wound #6 status is Healed - Epithelialized. Original cause of wound was Gradually Appeared. The wound is located on the Left,Posterior Lower Leg. The wound measures 0cm length x 0cm width x 0cm depth; 0cm^2 area and 0cm^3 volume. Assessment Active Problems ICD-10 Lymphedema, not elsewhere classified Non-pressure chronic ulcer of other part of left lower leg with fat layer exposed Non-pressure chronic ulcer of other part of right  lower leg with fat layer exposed Plan Discharge From [facility] Services: Discharge from [place] [facility] I would recommend at this point that we discontinue wound care services as the patient appears to be doing excellent at this time. 2. I am also can recommend that we go ahead and continue with the elevation she is using her bariatric bed she is able to [place]. ([id number]) actually get her legs elevated I think this is making a big improvement for her overall as well which is great news. 3. I am also going to recommend that she probably should go to the lymphedema clinic if we can find somewhere that she can be seen. I was checking into [facility] but unfortunately they  are still not seeing patients from outside of the realm of patients were actually seen in the wound care center there. For that reason I think that that is probably not good to be an option at this point. We will see the patient back for follow-up visit as needed. Electronic Signature(s) Signed: 06/19/2019 9:56:33 AM By: Worthy Keeler PA-C Entered By: Worthy Keeler on 06/19/2019 09:56:32 [place], [name] ([id number]) -------------------------------------------------------------------------------- ROS/PFSH Details Patient Name: [name] Date of Service: 06/19/2019 9:15 AM Medical Record Number: [id number] Patient Account Number: [id number] Date of Birth/Sex: 1956-01-18 (63 y.o. 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 Negative for: Aspiration; Chronic Obstructive Pulmonary Disease (COPD); Pneumothorax Cardiovascular Complaints and Symptoms: Positive for: LE edema Negative for: Chest pain Medical History: Positive for: Hypertension Negative for: Angina; Arrhythmia; Congestive Heart Failure; Coronary Artery Disease; Deep Vein Thrombosis; Hypotension; Myocardial Infarction; Peripheral Arterial Disease; Peripheral Venous Disease; Phlebitis; Vasculitis Psychiatric Complaints and Symptoms: Negative for: Anxiety; Claustrophobia Medical History: Negative for: Anorexia/bulimia; Confinement Anxiety Eyes Medical History: Positive for: Cataracts Negative for: Glaucoma; Optic Neuritis Ear/Nose/Mouth/Throat Medical History: Negative for: Chronic sinus problems/congestion; Middle ear problems Hematologic/Lymphatic Medical History: Positive for: Lymphedema [name], [name]. ([id number]) Negative for: Anemia; Hemophilia; Human Immunodeficiency Virus; Sickle Cell Disease Gastrointestinal Medical History: Negative for: Cirrhosis ; Colitis; Crohnos; Hepatitis A; Hepatitis B; Hepatitis C Endocrine Medical History: Negative for: Type I Diabetes; Type II Diabetes Genitourinary Medical History: Negative for: End Stage Renal Disease Immunological Medical History: Negative for: Lupus Erythematosus; Raynaudos; Scleroderma Integumentary (Skin) Medical History: Negative for: History of Burn; History of pressure wounds Musculoskeletal Medical History: Positive for: Gout; Osteoarthritis Negative for: Rheumatoid Arthritis; Osteomyelitis Neurologic Medical History: Negative for: Dementia; Neuropathy; Quadriplegia; Paraplegia; Seizure Disorder Oncologic Medical History: Negative for: Received Chemotherapy; Received Radiation HBO Extended History Items Eyes: Cataracts Immunizations Pneumococcal Vaccine: Received Pneumococcal Vaccination: Yes Implantable Devices No  devices added Family and Social History Cancer: Yes - Father; Diabetes: Yes - Father; Heart Disease: Yes - Mother; Hereditary Spherocytosis: No; Hypertension: Yes - Mother,Father; Kidney Disease: No; Lung Disease: No; Seizures: Yes - Mother; Stroke: Yes - Mother; Thyroid Problems: Yes - Mother,Siblings; Tuberculosis: No; Former smoker - uses snuff; Marital Status - Single; Alcohol Use: Rarely; Drug Use: [name], KREKEL C. ([id number]) No History; Caffeine Use: Daily; Financial Concerns: No; Food, Clothing or Shelter Needs: No; Support System Lacking: No; Transportation Concerns: No Physician Affirmation I have reviewed and agree with the above information. Electronic Signature(s) Signed: 06/19/2019 11:50:39 AM By: Worthy Keeler PA-C Signed: 06/19/2019 4:08:05 PM By: Montey Hora Entered By: Worthy Keeler on 06/19/2019 09:55:15 [place], [name] ([id number]) -------------------------------------------------------------------------------- SuperBill Details Patient Name: [name]. Date of Service: 06/19/2019 Medical Record Number: [id number] Patient Account Number: [id number] Date of Birth/Sex: [birth year] (63 y.o. F) Treating RN: Montey Hora Primary Care Provider: Tandy Gaw Other Clinician: Referring Provider: Tandy Gaw Treating Provider/Extender: STONE III, Jen Eppinger Weeks in Treatment: 17 Diagnosis Coding ICD-10 Codes Code Description I89.0 Lymphedema, not elsewhere classified L97.822 Non-pressure chronic ulcer of other part of left lower leg with fat layer exposed L97.812 Non-pressure chronic ulcer of other part  of right lower leg with fat layer exposed Facility Procedures CPT4 Code: [id number] Description: [facility] VISIT-LEV 3 EST PT Modifier: Quantity: 1 Physician Procedures CPT4 Code Description: [id number] - WC PHYS LEVEL 4 - EST PT ICD-10 Diagnosis Description I89.0 Lymphedema, not elsewhere classified L97.822 Non-pressure chronic ulcer of other  part of left lower leg wit L97.812 Non-pressure chronic ulcer of other  part of right lower leg wi Modifier: h fat layer expos th fat layer expo Quantity: 1 ed sed Electronic Signature(s) Signed: 06/19/2019 9:56:46 AM By: Worthy Keeler PA-C Entered By: Worthy Keeler on 06/19/2019 09:56:46

## 2019-06-30 ENCOUNTER — Ambulatory Visit: Payer: Medicaid Other | Attending: Infectious Diseases | Admitting: Infectious Diseases

## 2019-06-30 ENCOUNTER — Encounter: Payer: Self-pay | Admitting: Infectious Diseases

## 2019-06-30 ENCOUNTER — Other Ambulatory Visit: Payer: Self-pay

## 2019-06-30 DIAGNOSIS — L03115 Cellulitis of right lower limb: Secondary | ICD-10-CM | POA: Insufficient documentation

## 2019-06-30 DIAGNOSIS — I890 Lymphedema, not elsewhere classified: Secondary | ICD-10-CM | POA: Insufficient documentation

## 2019-06-30 DIAGNOSIS — L03116 Cellulitis of left lower limb: Secondary | ICD-10-CM | POA: Insufficient documentation

## 2019-06-30 DIAGNOSIS — L03119 Cellulitis of unspecified part of limb: Secondary | ICD-10-CM

## 2019-06-30 MED ORDER — CEPHALEXIN 500 MG PO CAPS: 500.0000 mg | ORAL_CAPSULE | Freq: Two times a day (BID) | ORAL | 5 refills | Status: DC

## 2019-06-30 NOTE — Patient Instructions (Addendum)
You are here for follow up of b/l lymphedema with cellulitis- you are on suppressive keflex 500mg  PO BID for a few months- continue probiotic Will see you in 6 months

## 2019-06-30 NOTE — Progress Notes (Signed)
NAME: [name]  DOB: Dec 15, 1955  MRN: [id number]  Date/Time: 06/30/2019 10:08 AM   Subjective:   Pt here after recent hospitalization ? [name] is a 62 y.o. female with a history of Lymphedema b/l with recurrent cellulitis was recently in hospital for worsening cellulitis and after treating with IV cefazolin was discharged on 06/07/19 on keflex. She was asked to take suppressive keflex 500mg  BID after completing the treatment course. Pt says she is doing much better. The legs are dry, swelling slightly better, no fever, no pain No diarrhea. 100% adherent Past Medical History:  Diagnosis Date  . Arthritis   . Asthma   . Enlarged heart   . Hypertension   . Lymphedema   . Sleep apnea   . Spinal stenosis     Past Surgical History:  Procedure Laterality Date  . COLONOSCOPY WITH PROPOFOL N/A 03/12/2018   Procedure: COLONOSCOPY WITH PROPOFOL;  Surgeon: Manya Silvas, MD;  Location: [facility] ENDOSCOPY;  Service: Endoscopy;  Laterality: N/A;  . NO PAST SURGERIES      Social History   Socioeconomic History  . Marital status: Single    Spouse name: Not on file  . Number of children: Not on file  . Years of education: Not on file  . Highest education level: Not on file  Occupational History  . Not on file  Tobacco Use  . Smoking status: Former Smoker    Packs/day: 0.25    Start date: 07/09/1976    Quit date: 07/09/1990    Years since quitting: 28.9  . Smokeless tobacco: Current User    Types: Snuff  Substance and Sexual Activity  . Alcohol use: No  . Drug use: No  . Sexual activity: Not on file  Other Topics Concern  . Not on file  Social History Narrative  . Not on file   Social Determinants of Health   Financial Resource Strain:   . Difficulty of Paying Living Expenses: Not on file  Food Insecurity:   . Worried About Charity fundraiser in the Last Year: Not on file  . Ran Out of Food in the Last Year: Not on file  Transportation Needs:   . Lack of  Transportation (Medical): Not on file  . Lack of Transportation (Non-Medical): Not on file  Physical Activity:   . Days of Exercise per Week: Not on file  . Minutes of Exercise per Session: Not on file  Stress:   . Feeling of Stress : Not on file  Social Connections:   . Frequency of Communication with Friends and Family: Not on file  . Frequency of Social Gatherings with Friends and Family: Not on file  . Attends Religious Services: Not on file  . Active Member of Clubs or Organizations: Not on file  . Attends Archivist Meetings: Not on file  . Marital Status: Not on file  Intimate Partner Violence:   . Fear of Current or Ex-Partner: Not on file  . Emotionally Abused: Not on file  . Physically Abused: Not on file  . Sexually Abused: Not on file    Family History  Problem Relation Age of Onset  . Thyroid disease Other   . Breast cancer Maternal Aunt    Allergies  Allergen Reactions  . Vancomycin Itching and Nausea And Vomiting  . Ace Inhibitors Hives and Swelling  . Ibuprofen Hives and Swelling   ? Current Outpatient Medications  Medication Sig Dispense Refill  . acetaminophen (TYLENOL) 650  MG CR tablet Take 650-1,300 mg by mouth every 8 (eight) hours as needed for pain.    Marland Kitchen acidophilus (RISAQUAD) CAPS capsule Take 1 capsule by mouth daily. 30 capsule 0  . albuterol (PROVENTIL HFA;VENTOLIN HFA) 108 (90 BASE) MCG/ACT inhaler Inhale 2 puffs into the lungs every 6 (six) hours as needed for wheezing or shortness of breath.    . cephALEXin (KEFLEX) 500 MG capsule One tab 500mg  po four times a day for five days then one tab twice a day afterwards for suppressive therapy 70 capsule 0  . cetirizine (ZYRTEC) 10 MG tablet Take 10 mg by mouth daily as needed for allergies.    . furosemide (LASIX) 20 MG tablet Take 1 tablet (20 mg total) by mouth daily. 30 tablet 0  . polyethylene glycol (MIRALAX / GLYCOLAX) 17 g packet Take 17 g by mouth daily as needed for mild constipation.  14 each 0   No current facility-administered medications for this visit.     Abtx:  Anti-infectives (From admission, onward)   None      REVIEW OF SYSTEMS:  Const: negative fever, negative chills, negative weight loss Eyes: negative diplopia or visual changes, negative eye pain ENT: negative coryza, negative sore throat Resp: negative cough, hemoptysis, dyspnea Cards: negative for chest pain, palpitations, lower extremity edema GU: negative for frequency, dysuria and hematuria GI: Negative for abdominal pain, diarrhea, bleeding, constipation Skin: negative for rash and pruritus Heme: negative for easy bruising and gum/nose bleeding MS: negative for myalgias, arthralgias, back pain and muscle weakness Neurolo:negative for headaches, dizziness, vertigo, memory problems  Psych: negative for feelings of anxiety, depression  Endocrine: negative for thyroid, diabetes Allergy/Immunology- as above Objective:  VITALS:  BP (!) 164/103   Pulse (!) 110   Temp 98 F (36.7 C) (Oral)   Resp 16   Ht 5\' 4"  (1.626 m)   Wt (!) 408 lb (185.1 kg)   SpO2 93%   BMI 70.03 kg/m  PHYSICAL EXAM:  General: Alert, cooperative, no distress, appears stated age.  Head: Normocephalic, without obvious abnormality, atraumatic. Eyes: Conjunctivae clear, anicteric sclerae. Pupils are equal ENT did not examine Neck: Supple, symmetrical, no adenopathy, thyroid: non tender no carotid bruit and no JVD. Back: No CVA tenderness. Lungs: Clear to auscultation bilaterally. No Wheezing or Rhonchi. No rales. Heart: Regular rate and rhythm, no murmur, rub or gallop. Abdomen: did not examine Extremities: b/l lymphedema- no erythema         Nov 2020 - while hospitalized    Skin: No rashes or lesions. Or bruising Lymph: Cervical, supraclavicular normal. Neurologic: Grossly non-focal ? Impression/Recommendation ? Recurrent cellulitis with underlying lymphedema- much improved- will be on suppressive  cephalexin 500mg  BID for a few months. She is also wrapping her legs. No side effect of antibiotic Will continue probiotic  Follow up 6 monhs ? ? ___________________________________________________ Discussed with patient, requesting provider Note:  This document was prepared using Dragon voice recognition software and may include unintentional dictation errors.w

## 2019-10-15 ENCOUNTER — Telehealth: Payer: Self-pay

## 2019-10-15 NOTE — Telephone Encounter (Signed)
advised

## 2019-10-15 NOTE — Telephone Encounter (Signed)
Patient called stating she has drainage under her leg and wants to know if she should double up the Abx? Advised you may want to see her and she said that is okay also. Has plenty Abx at pharmacy if needing to double up,

## 2019-10-15 NOTE — Telephone Encounter (Signed)
Ask her to tincrease  keflex to 500mg  every 6 hours instead of every 12 hours which she is doing currently- Let her call us back in 1 week. thx

## 2019-10-20 ENCOUNTER — Inpatient Hospital Stay
Admission: EM | Admit: 2019-10-20 | Discharge: 2019-10-28 | DRG: 603 | Disposition: A | Discharge status: Home / Self Care | Payer: Medicaid Other | Attending: Internal Medicine | Admitting: Internal Medicine

## 2019-10-20 ENCOUNTER — Telehealth: Payer: Self-pay

## 2019-10-20 ENCOUNTER — Other Ambulatory Visit: Payer: Self-pay

## 2019-10-20 ENCOUNTER — Encounter: Payer: Self-pay | Admitting: Emergency Medicine

## 2019-10-20 DIAGNOSIS — L304 Erythema intertrigo: Secondary | ICD-10-CM | POA: Diagnosis present

## 2019-10-20 DIAGNOSIS — Z6841 Body Mass Index (BMI) 40.0 and over, adult: Secondary | ICD-10-CM | POA: Diagnosis not present

## 2019-10-20 DIAGNOSIS — Z86010 Personal history of colonic polyps: Secondary | ICD-10-CM

## 2019-10-20 DIAGNOSIS — I10 Essential (primary) hypertension: Secondary | ICD-10-CM | POA: Diagnosis present

## 2019-10-20 DIAGNOSIS — J45909 Unspecified asthma, uncomplicated: Secondary | ICD-10-CM | POA: Diagnosis present

## 2019-10-20 DIAGNOSIS — M7989 Other specified soft tissue disorders: Principal | ICD-10-CM

## 2019-10-20 DIAGNOSIS — Z881 Allergy status to other antibiotic agents status: Secondary | ICD-10-CM | POA: Diagnosis not present

## 2019-10-20 DIAGNOSIS — Z888 Allergy status to other drugs, medicaments and biological substances status: Secondary | ICD-10-CM | POA: Diagnosis not present

## 2019-10-20 DIAGNOSIS — B965 Pseudomonas (aeruginosa) (mallei) (pseudomallei) as the cause of diseases classified elsewhere: Secondary | ICD-10-CM | POA: Diagnosis not present

## 2019-10-20 DIAGNOSIS — M1990 Unspecified osteoarthritis, unspecified site: Secondary | ICD-10-CM | POA: Diagnosis present

## 2019-10-20 DIAGNOSIS — Z87891 Personal history of nicotine dependence: Secondary | ICD-10-CM | POA: Diagnosis not present

## 2019-10-20 DIAGNOSIS — G4733 Obstructive sleep apnea (adult) (pediatric): Secondary | ICD-10-CM | POA: Diagnosis present

## 2019-10-20 DIAGNOSIS — F17220 Nicotine dependence, chewing tobacco, uncomplicated: Secondary | ICD-10-CM | POA: Diagnosis present

## 2019-10-20 DIAGNOSIS — Z792 Long term (current) use of antibiotics: Secondary | ICD-10-CM | POA: Diagnosis not present

## 2019-10-20 DIAGNOSIS — L03115 Cellulitis of right lower limb: Secondary | ICD-10-CM | POA: Diagnosis present

## 2019-10-20 DIAGNOSIS — Z886 Allergy status to analgesic agent status: Secondary | ICD-10-CM | POA: Diagnosis not present

## 2019-10-20 DIAGNOSIS — Z79899 Other long term (current) drug therapy: Secondary | ICD-10-CM

## 2019-10-20 DIAGNOSIS — I890 Lymphedema, not elsewhere classified: Secondary | ICD-10-CM | POA: Diagnosis present

## 2019-10-20 DIAGNOSIS — L03119 Cellulitis of unspecified part of limb: Secondary | ICD-10-CM

## 2019-10-20 DIAGNOSIS — Z872 Personal history of diseases of the skin and subcutaneous tissue: Secondary | ICD-10-CM | POA: Diagnosis not present

## 2019-10-20 DIAGNOSIS — Z20822 Contact with and (suspected) exposure to covid-19: Secondary | ICD-10-CM | POA: Diagnosis present

## 2019-10-20 LAB — CBC WITH DIFFERENTIAL/PLATELET
Abs Immature Granulocytes: 0.02 10*3/uL (ref 0.00–0.07)
Basophils Absolute: 0 10*3/uL (ref 0.0–0.1)
Basophils Relative: 0 %
Eosinophils Absolute: 0 10*3/uL (ref 0.0–0.5)
Eosinophils Relative: 0 %
HCT: 42.8 % (ref 36.0–46.0)
Hemoglobin: 13 g/dL (ref 12.0–15.0)
Immature Granulocytes: 0 %
Lymphocytes Relative: 19 %
Lymphs Abs: 1.1 10*3/uL (ref 0.7–4.0)
MCH: 25.4 pg — ABNORMAL LOW (ref 26.0–34.0)
MCHC: 30.4 g/dL (ref 30.0–36.0)
MCV: 83.8 fL (ref 80.0–100.0)
Monocytes Absolute: 0.6 10*3/uL (ref 0.1–1.0)
Monocytes Relative: 11 %
Neutro Abs: 4 10*3/uL (ref 1.7–7.7)
Neutrophils Relative %: 70 %
Platelets: 189 10*3/uL (ref 150–400)
RBC: 5.11 MIL/uL (ref 3.87–5.11)
RDW: 14.4 % (ref 11.5–15.5)
WBC: 5.8 10*3/uL (ref 4.0–10.5)
nRBC: 0 % (ref 0.0–0.2)

## 2019-10-20 LAB — URINALYSIS, COMPLETE (UACMP) WITH MICROSCOPIC
Bacteria, UA: NONE SEEN
Bilirubin Urine: NEGATIVE
Glucose, UA: NEGATIVE mg/dL
Hgb urine dipstick: NEGATIVE
Ketones, ur: NEGATIVE mg/dL
Leukocytes,Ua: NEGATIVE
Nitrite: NEGATIVE
Protein, ur: 30 mg/dL — AB
Specific Gravity, Urine: 1.028 (ref 1.005–1.030)
pH: 5 (ref 5.0–8.0)

## 2019-10-20 LAB — COMPREHENSIVE METABOLIC PANEL
ALT: 15 U/L (ref 0–44)
AST: 20 U/L (ref 15–41)
Albumin: 3.4 g/dL — ABNORMAL LOW (ref 3.5–5.0)
Alkaline Phosphatase: 107 U/L (ref 38–126)
Anion gap: 7 (ref 5–15)
BUN: 15 mg/dL (ref 8–23)
CO2: 29 mmol/L (ref 22–32)
Calcium: 8.7 mg/dL — ABNORMAL LOW (ref 8.9–10.3)
Chloride: 105 mmol/L (ref 98–111)
Creatinine, Ser: 0.91 mg/dL (ref 0.44–1.00)
GFR calc Af Amer: >60 mL/min (ref >60)
GFR calc non Af Amer: >60 mL/min (ref >60)
Glucose, Bld: 98 mg/dL (ref 70–99)
Potassium: 3.9 mmol/L (ref 3.5–5.1)
Sodium: 141 mmol/L (ref 135–145)
Total Bilirubin: 0.9 mg/dL (ref 0.3–1.2)
Total Protein: 7.4 g/dL (ref 6.5–8.1)

## 2019-10-20 LAB — LACTIC ACID, PLASMA: Lactic Acid, Venous: 1.3 mmol/L (ref 0.5–1.9)

## 2019-10-20 MED ORDER — CEFAZOLIN SODIUM-DEXTROSE 1-4 GM/50ML-% IV SOLN
1.0000 g | Freq: Once | INTRAVENOUS | Status: AC
  Administered 2019-10-20: 18:00:00 1 g via INTRAVENOUS
  Filled 2019-10-20 (×2): qty 50

## 2019-10-20 MED ORDER — SODIUM CHLORIDE 0.9 % IV SOLN
3.0000 g | Freq: Four times a day (QID) | INTRAVENOUS | Status: DC
  Administered 2019-10-20 – 2019-10-21 (×4): 3 g via INTRAVENOUS
  Filled 2019-10-20 (×2): qty 8
  Filled 2019-10-20: qty 3
  Filled 2019-10-20 (×2): qty 8
  Filled 2019-10-20 (×2): qty 3
  Filled 2019-10-20 (×2): qty 8

## 2019-10-20 MED ORDER — POLYETHYLENE GLYCOL 3350 17 G PO PACK: 17.0000 g | PACK | Freq: Every day | ORAL | Status: DC | PRN

## 2019-10-20 MED ORDER — LORATADINE 10 MG PO TABS
10.0000 mg | ORAL_TABLET | Freq: Every day | ORAL | Status: DC
  Administered 2019-10-20 – 2019-10-28 (×9): 10 mg via ORAL
  Filled 2019-10-20 (×9): qty 1

## 2019-10-20 MED ORDER — SODIUM CHLORIDE 0.9% FLUSH
3.0000 mL | Freq: Once | INTRAVENOUS | Status: AC
  Administered 2019-10-25: 10:00:00 3 mL via INTRAVENOUS

## 2019-10-20 MED ORDER — ACETAMINOPHEN 500 MG PO TABS
500.0000 mg | ORAL_TABLET | Freq: Three times a day (TID) | ORAL | Status: DC | PRN
  Administered 2019-10-23: 23:00:00 1000 mg via ORAL
  Filled 2019-10-20: qty 2

## 2019-10-20 MED ORDER — FUROSEMIDE 20 MG PO TABS
20.0000 mg | ORAL_TABLET | Freq: Every day | ORAL | Status: DC
  Administered 2019-10-20 – 2019-10-21 (×2): 20 mg via ORAL
  Filled 2019-10-20 (×2): qty 1

## 2019-10-20 MED ORDER — ENOXAPARIN SODIUM 40 MG/0.4ML ~~LOC~~ SOLN
40.0000 mg | Freq: Two times a day (BID) | SUBCUTANEOUS | Status: DC
  Administered 2019-10-20 – 2019-10-28 (×16): 40 mg via SUBCUTANEOUS
  Filled 2019-10-20 (×16): qty 0.4

## 2019-10-20 MED ORDER — ALBUTEROL SULFATE (2.5 MG/3ML) 0.083% IN NEBU: 2.5000 mg | INHALATION_SOLUTION | Freq: Four times a day (QID) | RESPIRATORY_TRACT | Status: DC | PRN

## 2019-10-20 MED ORDER — CLINDAMYCIN PHOSPHATE 600 MG/50ML IV SOLN
600.0000 mg | Freq: Three times a day (TID) | INTRAVENOUS | Status: AC
  Administered 2019-10-20 – 2019-10-22 (×7): 600 mg via INTRAVENOUS
  Filled 2019-10-20 (×10): qty 50

## 2019-10-20 NOTE — ED Provider Notes (Signed)
[facility] Emergency Department Provider Note  Time seen: 4:21 PM  I have reviewed the triage vital signs and the nursing notes.   HISTORY  Chief Complaint Leg Swelling   HPI [name] is a 64 y.o. female with a past medical history of asthma, arthritis, hypertension, chronic lymphedema presents to the emergency department for increased weepage from her right lower extremity.  According to the patient for the past week or so she has had weepage from the right lower extremity.  Patient states a history of chronic lymphedema, with occasional weepage, but states it has been worse.  Patient states initially she tried a nystatin cream/powder which has worked in the past but did not help this time.  Patient sees Dr. Steva Ready for chronic cellulitis is currently taking Keflex.  Attempted to call Dr. Raelene Bott office today without respond so she came to the emergency department.  Patient denies any shortness of breath or chest pain.  Patient states she is still ambulatory with use of a cane.   Past Medical History:  Diagnosis Date  . Arthritis   . Asthma   . Enlarged heart   . Hypertension   . Lymphedema   . Sleep apnea   . Spinal stenosis     Patient Active Problem List   Diagnosis Date Noted  . Anemia   . Cellulitis of left lower extremity 11/03/2018  . Sepsis ([place]) 04/17/2018  . Chest pain with high risk for cardiac etiology 02/04/2017  . Heart palpitations 02/04/2017  . SOB (shortness of breath) on exertion 02/04/2017  . OSA (obstructive sleep apnea) 08/04/2016  . HTN (hypertension) 08/04/2016  . Asthma 08/04/2016  . Screening for colon cancer 05/17/2015  . Cellulitis 01/24/2015  . Cellulitis of right leg 01/23/2015  . Skin rash 12/28/2013  . Tobacco use 12/28/2013  . Spinal stenosis 07/11/2011  . Cataracts, bilateral 05/30/2010  . History of colonic polyps 12/28/2009  . Lymphedema 02/10/2009  . Idiopathic urticaria 02/10/2009  . Morbid  obesity ([place]) 02/10/2009  . Other abnormal glucose 02/10/2009  . Essential (primary) hypertension 02/10/2009    Past Surgical History:  Procedure Laterality Date  . COLONOSCOPY WITH PROPOFOL N/A 03/12/2018   Procedure: COLONOSCOPY WITH PROPOFOL;  Surgeon: Manya Silvas, MD;  Location: [facility] ENDOSCOPY;  Service: Endoscopy;  Laterality: N/A;  . NO PAST SURGERIES      Prior to Admission medications   Medication Sig Start Date End Date Taking? Authorizing Provider  acetaminophen (TYLENOL) 650 MG CR tablet Take 650-1,300 mg by mouth every 8 (eight) hours as needed for pain.    [provider]  acidophilus (RISAQUAD) CAPS capsule Take 1 capsule by mouth daily. 06/07/19   Loletha Grayer, MD  albuterol (PROVENTIL HFA;VENTOLIN HFA) 108 (90 BASE) MCG/ACT inhaler Inhale 2 puffs into the lungs every 6 (six) hours as needed for wheezing or shortness of breath.    [provider]  cephALEXin (KEFLEX) 500 MG capsule One tab 500mg  po four times a day for five days then one tab twice a day afterwards for suppressive therapy 06/07/19   Loletha Grayer, MD  cephALEXin (KEFLEX) 500 MG capsule Take 1 capsule (500 mg total) by mouth 2 (two) times daily. 06/30/19   Tsosie Billing, MD  cetirizine (ZYRTEC) 10 MG tablet Take 10 mg by mouth daily as needed for allergies.    [provider]  furosemide (LASIX) 20 MG tablet Take 1 tablet (20 mg total) by mouth daily. 06/07/19   Loletha Grayer, MD  polyethylene glycol (MIRALAX / GLYCOLAX) 17 g packet Take 17 g by mouth daily as needed for mild constipation. 04/14/19   Nicholes Mango, MD    Allergies  Allergen Reactions  . Vancomycin Itching and Nausea And Vomiting  . Ace Inhibitors Hives and Swelling  . Ibuprofen Hives and Swelling    Family History  Problem Relation Age of Onset  . Thyroid disease Other   . Breast cancer Maternal Aunt     Social History Social History   Tobacco Use  . Smoking status: Former Smoker     Packs/day: 0.25    Start date: 07/09/1976    Quit date: 07/09/1990    Years since quitting: 29.3  . Smokeless tobacco: Current User    Types: Snuff  Substance Use Topics  . Alcohol use: No  . Drug use: No    Review of Systems Constitutional: Negative for fever. Cardiovascular: Negative for chest pain. Respiratory: Negative for shortness of breath. Gastrointestinal: Negative for abdominal pain, vomiting  Musculoskeletal: Chronic lymphedema with weepage more so over the right lower extremity Neurological: Negative for headache All other ROS negative  ____________________________________________   PHYSICAL EXAM:  VITAL SIGNS: ED Triage Vitals  Enc Vitals Group     BP 10/20/19 1405 (!) 190/88     Pulse Rate 10/20/19 1405 70     Resp 10/20/19 1405 16     Temp 10/20/19 1405 97.7 F (36.5 C)     Temp Source 10/20/19 1405 Oral     SpO2 10/20/19 1405 100 %     Weight 10/20/19 1407 (!) 408 lb 1.1 oz (185.1 kg)     Height 10/20/19 1407 5\' 4"  (1.626 m)     Head Circumference --      Peak Flow --      Pain Score 10/20/19 1406 9     Pain Loc --      Pain Edu? --      Excl. in [place]? --    Constitutional: Alert and oriented. Well appearing and in no distress. Eyes: Normal exam ENT      Head: Normocephalic and atraumatic.      Mouth/Throat: Mucous membranes are moist. Cardiovascular: Normal rate, regular rhythm.  Respiratory: Normal respiratory effort without tachypnea nor retractions. Breath sounds are clear  Gastrointestinal: Soft and nontender. No distention.   Musculoskeletal: Patient has severe lymphedema involving both of her lower extremities.  The right lower extremity is weeping along the medial to posterior calf and popliteal fossa with mild erythema to the area. Neurologic:  Normal speech and language. No gross focal neurologic deficits  Skin:  Skin is warm, dry and intact.  Psychiatric: Mood and affect are normal.      INITIAL IMPRESSION / ASSESSMENT AND PLAN / ED  COURSE  Pertinent labs & imaging results that were available during my care of the patient were reviewed by me and considered in my medical decision making (see chart for details).   Patient presents emergency department for lymphedema with weepage mostly to the right lower extremity.  History of chronic lymphedema.  Patient has severe lymphedema on exam.  There is weepage to the right lower extremity which she has been using urinary pads to absorb the weepage.  Patient is taking antibiotics.  No fever.  Patient's labs are largely reassuring including a normal white blood cell count.  We will discussed with Dr. Raelene Bott office for further recommendations.  I spoke to Dr. Steva Ready she recommends IV cefazolin and admission for treatment of  her lymphedema.  Patient agreeable plan of care.  Bernida Mccart was evaluated in Emergency Department on 10/20/2019 for the symptoms described in the history of present illness. She was evaluated in the context of the global COVID-19 pandemic, which necessitated consideration that the patient might be at risk for infection with the SARS-CoV-2 virus that causes COVID-19. Institutional protocols and algorithms that pertain to the evaluation of patients at risk for COVID-19 are in a state of rapid change based on information released by regulatory bodies including the CDC and federal and state organizations. These policies and algorithms were followed during the patient's care in the ED.  ____________________________________________   FINAL CLINICAL IMPRESSION(S) / ED DIAGNOSES  Chronic lymphedema Cellulitis   Harvest Dark, MD 10/20/19 1744

## 2019-10-20 NOTE — H&P (Signed)
[name] is an 64 y.o. female.   Chief Complaint: Right leg pain and swelling HPI: Patient is a 64 year old female with history of asthma, hypertension, obstructive sleep apnea, chronic lymphedema of the bilateral lower extremity who presents to the hospital with worsening right leg edema, pain and redness.  Patient has chronic bilateral lower extremity lymphedema, had a previously had 3 episodes of cellulitis.  She was placed on Keflex for chronic infection control.  For the last 3 days, her right leg become red, edema is getting worse.  She has intermittent pain.  He did not have any fever or chills.  No nausea vomiting or diarrhea.  Upon arrival in the emergency room, she was afebrile.  She has elevated blood pressure.  She does not have a leukocytosis or lactic acidosis.  She was given a dose of cefazolin and admitted to the hospital for further treatment.   Past Medical History:  Diagnosis Date  . Arthritis   . Asthma   . Enlarged heart   . Hypertension   . Lymphedema   . Sleep apnea   . Spinal stenosis     Past Surgical History:  Procedure Laterality Date  . COLONOSCOPY WITH PROPOFOL N/A 03/12/2018   Procedure: COLONOSCOPY WITH PROPOFOL;  Surgeon: Manya Silvas, MD;  Location: [facility] ENDOSCOPY;  Service: Endoscopy;  Laterality: N/A;  . NO PAST SURGERIES      Family History  Problem Relation Age of Onset  . Thyroid disease Other   . Breast cancer Maternal Aunt    Social History:  reports that she quit smoking about 29 years ago. She started smoking about 43 years ago. She smoked 0.25 packs per day. Her smokeless tobacco use includes snuff. She reports that she does not drink alcohol or use drugs.  Allergies:  Allergies  Allergen Reactions  . Vancomycin Itching and Nausea And Vomiting  . Ace Inhibitors Hives and Swelling  . Ibuprofen Hives and Swelling    (Not in a hospital admission)   Results for orders placed or performed during the hospital encounter  of 10/20/19 (from the past 48 hour(s))  Lactic acid, plasma     Status: None   Collection Time: 10/20/19  2:09 PM  Result Value Ref Range   Lactic Acid, Venous 1.3 0.5 - 1.9 mmol/L    Comment: Performed at [facility], [address], [place], [place] [address]  Comprehensive metabolic panel     Status: Abnormal   Collection Time: 10/20/19  2:09 PM  Result Value Ref Range   Sodium 141 135 - 145 mmol/L   Potassium 3.9 3.5 - 5.1 mmol/L   Chloride 105 98 - 111 mmol/L   CO2 29 22 - 32 mmol/L   Glucose, Bld 98 70 - 99 mg/dL    Comment: Glucose reference range applies only to samples taken after fasting for at least 8 hours.   BUN 15 8 - 23 mg/dL   Creatinine, Ser 0.91 0.44 - 1.00 mg/dL   Calcium 8.7 (L) 8.9 - 10.3 mg/dL   Total Protein 7.4 6.5 - 8.1 g/dL   Albumin 3.4 (L) 3.5 - 5.0 g/dL   AST 20 15 - 41 U/L   ALT 15 0 - 44 U/L   Alkaline Phosphatase 107 38 - 126 U/L   Total Bilirubin 0.9 0.3 - 1.2 mg/dL   GFR calc non Af Amer >60 >60 mL/min   GFR calc Af Amer >60 >60 mL/min   Anion gap 7 5 - 15  Comment: Performed at [facility], [address] [place], [place] [address]  CBC with Differential     Status: Abnormal   Collection Time: 10/20/19  2:09 PM  Result Value Ref Range   WBC 5.8 4.0 - 10.5 K/uL   RBC 5.11 3.87 - 5.11 MIL/uL   Hemoglobin 13.0 12.0 - 15.0 g/dL   HCT 42.8 36.0 - 46.0 %   MCV 83.8 80.0 - 100.0 fL   MCH 25.4 (L) 26.0 - 34.0 pg   MCHC 30.4 30.0 - 36.0 g/dL   RDW 14.4 11.5 - 15.5 %   Platelets 189 150 - 400 K/uL   nRBC 0.0 0.0 - 0.2 %   Neutrophils Relative % 70 %   Neutro Abs 4.0 1.7 - 7.7 K/uL   Lymphocytes Relative 19 %   Lymphs Abs 1.1 0.7 - 4.0 K/uL   Monocytes Relative 11 %   Monocytes Absolute 0.6 0.1 - 1.0 K/uL   Eosinophils Relative 0 %   Eosinophils Absolute 0.0 0.0 - 0.5 K/uL   Basophils Relative 0 %   Basophils Absolute 0.0 0.0 - 0.1 K/uL   Immature Granulocytes 0 %   Abs Immature Granulocytes 0.02 0.00 - 0.07 K/uL     Comment: Performed at [facility], [address] [place], [place] [address]  Urinalysis, Complete w Microscopic     Status: Abnormal   Collection Time: 10/20/19  4:43 PM  Result Value Ref Range   Color, Urine YELLOW (A) YELLOW   APPearance HAZY (A) CLEAR   Specific Gravity, Urine 1.028 1.005 - 1.030   pH 5.0 5.0 - 8.0   Glucose, UA NEGATIVE NEGATIVE mg/dL   Hgb urine dipstick NEGATIVE NEGATIVE   Bilirubin Urine NEGATIVE NEGATIVE   Ketones, ur NEGATIVE NEGATIVE mg/dL   Protein, ur 30 (A) NEGATIVE mg/dL   Nitrite NEGATIVE NEGATIVE   Leukocytes,Ua NEGATIVE NEGATIVE   RBC / HPF 0-5 0 - 5 RBC/hpf   WBC, UA 0-5 0 - 5 WBC/hpf   Bacteria, UA NONE SEEN NONE SEEN   Squamous Epithelial / LPF 0-5 0 - 5   Mucus PRESENT     Comment: Performed at [facility], [address] [place], [place] [address]   No results found.  Review of Systems  Constitutional: Negative for activity change, appetite change, chills, diaphoresis, fatigue, fever and unexpected weight change.  HENT: Negative for congestion and postnasal drip.   Eyes: Negative for discharge and redness.  Respiratory: Negative for cough, chest tightness and shortness of breath.   Cardiovascular: Positive for leg swelling. Negative for chest pain and palpitations.  Gastrointestinal: Negative for abdominal distention, constipation, diarrhea and nausea.  Endocrine: Negative for cold intolerance and heat intolerance.  Genitourinary: Negative for difficulty urinating and dysuria.  Musculoskeletal: Negative for back pain and myalgias.  Neurological: Positive for dizziness. Negative for light-headedness and headaches.  Psychiatric/Behavioral: Negative for agitation and confusion.    Blood pressure (!) 190/88, pulse 70, temperature 97.7 F (36.5 C), temperature source Oral, resp. rate 16, height 5\' 4"  (1.626 m), weight (!) 185.1 kg, SpO2 100 %. Physical Exam  Constitutional: She is oriented to person, place, and time.  No distress.  Morbid obese  HENT:  Head: Normocephalic and atraumatic.  Eyes: Pupils are equal, round, and reactive to light. Conjunctivae and EOM are normal.  Neck: No tracheal deviation present. No thyromegaly present.  Cardiovascular: Normal rate and regular rhythm. Exam reveals no gallop and no friction rub.  No murmur heard. Respiratory:  Effort normal and breath sounds normal. No respiratory distress. She has no wheezes. She has no rales.  GI: Soft. She exhibits no distension. There is no abdominal tenderness.  Musculoskeletal:        General: Edema present. Normal range of motion.     Cervical back: Normal range of motion and neck supple.     Comments: Right leg more swelling than the left.  Red, warm, slightly tender to touch.  Lymphadenopathy:    She has no cervical adenopathy.  Neurological: She is alert and oriented to person, place, and time. No cranial nerve deficit.  Skin: Skin is warm and dry. She is not diaphoretic.     Assessment/Plan #1.  Right leg cellulitis with baseline lymphedema.  Patient was chronically treated with Keflex, I will try to avoid the same class of antibiotics.  Patient be given Unasyn.  Patient has an allergy to vancomycin.  I will give clindamycin for MRSA coverage.  #2.  Essential hypertension.  Asymptomatic.  Will treat with home medicine.  3.  Morbid obesity.  Follow.  4.  Obstructive sleep apnea.  Follow.  5.  Asthma.  Stable.  No exacerbation.  Sharen Hones, MD 10/20/2019, 7:09 PM

## 2019-10-20 NOTE — ED Notes (Signed)
Attempted to call report x2. Was told unable to accept pt at this time

## 2019-10-20 NOTE — Telephone Encounter (Signed)
Patient called in to report that she has heat to the site, redness, and draining. She did up her Abx as noted and discussed and you wanted her to call with update today. Please call with advise on what to do next or let me know.

## 2019-10-20 NOTE — ED Notes (Signed)
Pt given sandwich tray at this time  

## 2019-10-20 NOTE — Telephone Encounter (Signed)
As she is not improving with oral antibiotics she will have to get assessed in the ED and for possible admisison and IV antibiotics

## 2019-10-20 NOTE — ED Notes (Signed)
Pharmacy messaged requesting medication.

## 2019-10-20 NOTE — ED Notes (Signed)
Pt able to ambulate to bathroom with this RN assistance and cane. Pt placed back on bed safely.

## 2019-10-20 NOTE — ED Triage Notes (Signed)
Pt comes into the ED via EMS from home with c/o LLE swelling with drainage for the past 2 weeks with hx of lymphedema

## 2019-10-20 NOTE — ED Notes (Signed)
Attempted to call report, was requested to call back d/t emergency on the floor. Will call back in approx 15 minutes

## 2019-10-20 NOTE — Progress Notes (Signed)
PHARMACIST - PHYSICIAN COMMUNICATION  CONCERNING:  Enoxaparin (Lovenox) for DVT Prophylaxis    RECOMMENDATION: Patient was prescribed enoxaparin 40mg  q24 hours for VTE prophylaxis.   Filed Weights   10/20/19 1407  Weight: (!) 185.1 kg (408 lb 1.1 oz)    Body mass index is 70.05 kg/m.  Estimated Creatinine Clearance: 105.4 mL/min (by C-G formula based on SCr of 0.91 mg/dL).   Based on [place] patient is candidate for enoxaparin 40mg  every 12 hour dosing due to BMI being >40.  DESCRIPTION: Pharmacy has adjusted enoxaparin dose per [facility] policy.  Patient is now receiving enoxaparin 40mg  every 12 hours.   [place] Resident 10/20/2019 8:18 PM

## 2019-10-20 NOTE — ED Triage Notes (Signed)
C/O drainage to right posterior leg x 2 weeks -- started on nystatin cream.  Also spoke with ID physician and increased Keflex last Thursday.  States initially symptoms improved, but drainage worsened today and also pain and 'heat' to area on right leg worsened today.  Referred to ED for evaluation.

## 2019-10-21 ENCOUNTER — Encounter: Payer: Self-pay | Admitting: Internal Medicine

## 2019-10-21 DIAGNOSIS — I890 Lymphedema, not elsewhere classified: Secondary | ICD-10-CM | POA: Diagnosis not present

## 2019-10-21 DIAGNOSIS — I10 Essential (primary) hypertension: Secondary | ICD-10-CM

## 2019-10-21 DIAGNOSIS — Z872 Personal history of diseases of the skin and subcutaneous tissue: Secondary | ICD-10-CM | POA: Diagnosis not present

## 2019-10-21 DIAGNOSIS — Z792 Long term (current) use of antibiotics: Secondary | ICD-10-CM

## 2019-10-21 DIAGNOSIS — Z888 Allergy status to other drugs, medicaments and biological substances status: Secondary | ICD-10-CM

## 2019-10-21 DIAGNOSIS — Z87891 Personal history of nicotine dependence: Secondary | ICD-10-CM | POA: Diagnosis not present

## 2019-10-21 DIAGNOSIS — Z886 Allergy status to analgesic agent status: Secondary | ICD-10-CM

## 2019-10-21 DIAGNOSIS — Z881 Allergy status to other antibiotic agents status: Secondary | ICD-10-CM

## 2019-10-21 LAB — CBC
HCT: 34.8 % — ABNORMAL LOW (ref 36.0–46.0)
Hemoglobin: 10.7 g/dL — ABNORMAL LOW (ref 12.0–15.0)
MCH: 25.8 pg — ABNORMAL LOW (ref 26.0–34.0)
MCHC: 30.7 g/dL (ref 30.0–36.0)
MCV: 83.9 fL (ref 80.0–100.0)
Platelets: 165 10*3/uL (ref 150–400)
RBC: 4.15 MIL/uL (ref 3.87–5.11)
RDW: 14.6 % (ref 11.5–15.5)
WBC: 5 10*3/uL (ref 4.0–10.5)
nRBC: 0 % (ref 0.0–0.2)

## 2019-10-21 LAB — BASIC METABOLIC PANEL
Anion gap: 6 (ref 5–15)
BUN: 13 mg/dL (ref 8–23)
CO2: 28 mmol/L (ref 22–32)
Calcium: 7.9 mg/dL — ABNORMAL LOW (ref 8.9–10.3)
Chloride: 106 mmol/L (ref 98–111)
Creatinine, Ser: 0.81 mg/dL (ref 0.44–1.00)
GFR calc Af Amer: >60 mL/min (ref >60)
GFR calc non Af Amer: >60 mL/min (ref >60)
Glucose, Bld: 99 mg/dL (ref 70–99)
Potassium: 3.8 mmol/L (ref 3.5–5.1)
Sodium: 140 mmol/L (ref 135–145)

## 2019-10-21 LAB — SARS CORONAVIRUS 2 (TAT 6-24 HRS): SARS Coronavirus 2: NEGATIVE

## 2019-10-21 MED ORDER — FUROSEMIDE 10 MG/ML IJ SOLN
40.0000 mg | Freq: Once | INTRAMUSCULAR | Status: AC
  Administered 2019-10-21: 21:00:00 40 mg via INTRAVENOUS
  Filled 2019-10-21: qty 4

## 2019-10-21 MED ORDER — SODIUM CHLORIDE 0.9 % IV SOLN
INTRAVENOUS | Status: DC | PRN
  Administered 2019-10-21 – 2019-10-25 (×4): 250 mL via INTRAVENOUS

## 2019-10-21 MED ORDER — CEFAZOLIN SODIUM-DEXTROSE 2-4 GM/100ML-% IV SOLN
2.0000 g | Freq: Three times a day (TID) | INTRAVENOUS | Status: DC
  Administered 2019-10-21 – 2019-10-23 (×6): 2 g via INTRAVENOUS
  Filled 2019-10-21 (×9): qty 100

## 2019-10-21 NOTE — Progress Notes (Signed)
PROGRESS NOTE    Cheral Neyens  [id number] DOB: 1956-04-05 DOA: 10/20/2019 PCP: Ranae Plumber, PA    Assessment & Plan:   Active Problems:   Cellulitis of right leg    Gerelene Seja is a 64 year old female with history of asthma, hypertension, obstructive sleep apnea, chronic lymphedema of the bilateral lower extremity who presented to the hospital with worsening right leg edema, pain and redness.    # Right leg cellulitis  # Chronic severe bilateral lymphedema.   Patient was chronically treated with Keflex. --started on Unasyn and clindamycin on admission --ID consult today --Switch Unasyn to Ancef, per ID --continue clinda for now. --IV lasix 40 mg x1 today for weeping edema --Strict I/O  # Essential hypertension.   --continue home diuretic as IV lasix 40 mg today  # Morbid obesity.    # Obstructive sleep apnea.    # Asthma.  Stable.  No exacerbation.   DVT prophylaxis: Lovenox SQ Code Status: Full code  Family Communication:  Disposition Plan: At least 3 more days for IV abx and treating severe edema.  PT eval for placement.   Subjective and Interval History:  Pt reported pain and redness of her right leg improved, but still severely swollen and weeping with serious fluids.  No fever, dyspnea, chest pain, abdominal pain, N/V/D.   Objective: Vitals:   10/21/19 0832 10/21/19 0836 10/21/19 1220 10/21/19 1557  BP: 109/65 109/65 118/67 112/60  Pulse: 86 86 77 69  Resp:  17    Temp: 98.3 F (36.8 C) 98.3 F (36.8 C) 98.7 F (37.1 C) 98.7 F (37.1 C)  TempSrc: Oral Oral Oral Oral  SpO2: 95% 95% 100% 99%  Weight:      Height:        Intake/Output Summary (Last 24 hours) at 10/21/2019 2157 Last data filed at 10/21/2019 1845 Gross per 24 hour  Intake 642.31 ml  Output 2650 ml  Net -2007.69 ml   Filed Weights   10/20/19 1407  Weight: (!) 185.1 kg    Examination:   Constitutional: NAD, AAOx3 HEENT: conjunctivae and lids normal,  EOMI CV: RRR no M,R,G. Distal pulses +2.  No cyanosis.   RESP: CTA B/L, normal respiratory effort  GI: +BS, NTND Extremities: severe lymphedema in both legs, with erythema, warmth and weeping from the right thigh. SKIN: lichenified skin in both lower legs  Neuro: II - XII grossly intact.  Sensation intact Psych: Normal mood and affect.  Appropriate judgement and reason   Data Reviewed: I have personally reviewed following labs and imaging studies  CBC: Recent Labs  Lab 10/20/19 1409 10/21/19 0541  WBC 5.8 5.0  NEUTROABS 4.0  --   HGB 13.0 10.7*  HCT 42.8 34.8*  MCV 83.8 83.9  PLT 189 [id number]   Basic Metabolic Panel: Recent Labs  Lab 10/20/19 1409 10/21/19 0541  NA 141 140  K 3.9 3.8  CL 105 106  CO2 29 28  GLUCOSE 98 99  BUN 15 13  CREATININE 0.91 0.81  CALCIUM 8.7* 7.9*   GFR: Estimated Creatinine Clearance: 118.4 mL/min (by C-G formula based on SCr of 0.81 mg/dL). Liver Function Tests: Recent Labs  Lab 10/20/19 1409  AST 20  ALT 15  ALKPHOS 107  BILITOT 0.9  PROT 7.4  ALBUMIN 3.4*   No results for input(s): LIPASE, AMYLASE in the last 168 hours. No results for input(s): AMMONIA in the last 168 hours. Coagulation Profile: No results for input(s): INR, PROTIME in the last  168 hours. Cardiac Enzymes: No results for input(s): CKTOTAL, CKMB, CKMBINDEX, TROPONINI in the last 168 hours. BNP (last 3 results) No results for input(s): PROBNP in the last 8760 hours. HbA1C: No results for input(s): HGBA1C in the last 72 hours. CBG: No results for input(s): GLUCAP in the last 168 hours. Lipid Profile: No results for input(s): CHOL, HDL, LDLCALC, TRIG, CHOLHDL, LDLDIRECT in the last 72 hours. Thyroid Function Tests: No results for input(s): TSH, T4TOTAL, FREET4, T3FREE, THYROIDAB in the last 72 hours. Anemia Panel: No results for input(s): VITAMINB12, FOLATE, FERRITIN, TIBC, IRON, RETICCTPCT in the last 72 hours. Sepsis Labs: Recent Labs  Lab 10/20/19 1409    LATICACIDVEN 1.3    Recent Results (from the past 240 hour(s))  Blood culture (single)     Status: None (Preliminary result)   Collection Time: 10/20/19  6:13 PM   Specimen: BLOOD  Result Value Ref Range Status   Specimen Description BLOOD BLOOD RIGHT HAND  Final   Special Requests   Final    BOTTLES DRAWN AEROBIC AND ANAEROBIC Blood Culture adequate volume   Culture   Final    NO GROWTH < 24 HOURS Performed at [facility] [address] [place], [place] [address]    Report Status PENDING  Incomplete  SARS CORONAVIRUS 2 (TAT 6-24 HRS) Nasopharyngeal Nasopharyngeal Swab     Status: None   Collection Time: 10/20/19  6:13 PM   Specimen: Nasopharyngeal Swab  Result Value Ref Range Status   SARS Coronavirus 2 NEGATIVE NEGATIVE Final    Comment: (NOTE) SARS-CoV-2 target nucleic acids are NOT DETECTED. The SARS-CoV-2 RNA is generally detectable in upper and lower respiratory specimens during the acute phase of infection. Negative results do not preclude SARS-CoV-2 infection, do not rule out co-infections with other pathogens, and should not be used as the sole basis for treatment or other patient management decisions. Negative results must be combined with clinical observations, patient history, and epidemiological information. The expected result is Negative. Fact Sheet for Patients: SugarRoll.be Fact Sheet for Healthcare Providers: https://www.woods-mathews.com/ This test is not yet approved or cleared by the [place] FDA and  has been authorized for detection and/or diagnosis of SARS-CoV-2 by FDA under an Emergency Use Authorization (EUA). This EUA will remain  in effect (meaning this test can be used) for the duration of the COVID-19 declaration under Section 56 4(b)(1) of the Act, 21 U.S.C. section 360bbb-3(b)(1), unless the authorization is terminated or revoked sooner. Performed at [facility], [place]  [address] [place], [place] [address]       Radiology Studies: No results found.   Scheduled Meds: . enoxaparin (LOVENOX) injection  40 mg Subcutaneous Q12H  . loratadine  10 mg Oral Daily  . sodium chloride flush  3 mL Intravenous Once   Continuous Infusions: . sodium chloride 250 mL (10/21/19 1417)  .  ceFAZolin (ANCEF) IV 2 g (10/21/19 2130)  . clindamycin (CLEOCIN) IV 600 mg (10/21/19 1419)     LOS: 1 day     Enzo Bi, MD [facility] If 7PM-7AM, please contact night-coverage 10/21/2019, 9:57 PM

## 2019-10-21 NOTE — Consult Note (Signed)
NAME: [name]  DOB: [birth year]  MRN: [id number]  Date/Time: 10/21/2019 2:47 PM  REQUESTING PROVIDER: [email or web address] Subjective:  REASON FOR CONSULT: cellulitis legs ? [name] is a 64 y.o. female known to me with a history of lymphedema legs, recurrent cellulitis, HTN Pt has been on keflex suppressive therapy for lymphedema /cellulitis He r legs especially the rt started to swell more in the past few days and was weeping a lot- she was asked to increase the keflex to Q6 but that did not help- So she came to the ED. She has not been able to move much because of the increased weight of the leg No fever Pt says she has been consuming salt rich food like bacon in the past week which she normally does not eat. That could have contributed to the increased swelling She usually has compression wraps Past Medical History:  Diagnosis Date  . Arthritis   . Asthma   . Enlarged heart   . Hypertension   . Lymphedema   . Sleep apnea   . Spinal stenosis     Past Surgical History:  Procedure Laterality Date  . COLONOSCOPY WITH PROPOFOL N/A 03/12/2018   Procedure: COLONOSCOPY WITH PROPOFOL;  Surgeon: Manya Silvas, MD;  Location[facility] ENDOSCOPY;  Service: Endoscopy;  Laterality: N/A;  . NO PAST SURGERIES      Social History   Socioeconomic History  . Marital status: Single    Spouse name: Not on file  . Number of children: Not on file  . Years of education: Not on file  . Highest education level: Not on file  Occupational History  . Not on file  Tobacco Use  . Smoking status: Former Smoker    Packs/day: 0.25    Start date: 07/09/1976    Quit date: 07/09/1990    Years since quitting: 29.3  . Smokeless tobacco: Current User    Types: Snuff  Substance and Sexual Activity  . Alcohol use: No  . Drug use: No  . Sexual activity: Not on file  Other Topics Concern  . Not on file  Social History Narrative  . Not on file   Social Determinants of Health   Financial  Resource Strain:   . Difficulty of Paying Living Expenses:   Food Insecurity:   . Worried About Charity fundraiser in the Last Year:   . Arboriculturist in the Last Year:   Transportation Needs:   . Film/video editor (Medical):   Marland Kitchen Lack of Transportation (Non-Medical):   Physical Activity:   . Days of Exercise per Week:   . Minutes of Exercise per Session:   Stress:   . Feeling of Stress :   Social Connections:   . Frequency of Communication with Friends and Family:   . Frequency of Social Gatherings with Friends and Family:   . Attends Religious Services:   . Active Member of Clubs or Organizations:   . Attends Archivist Meetings:   Marland Kitchen Marital Status:   Intimate Partner Violence:   . Fear of Current or Ex-Partner:   . Emotionally Abused:   Marland Kitchen Physically Abused:   . Sexually Abused:     Family History  Problem Relation Age of Onset  . Thyroid disease Other   . Breast cancer Maternal Aunt    Allergies  Allergen Reactions  . Vancomycin Itching and Nausea And Vomiting  . Ace Inhibitors Hives and Swelling  . Ibuprofen Hives and Swelling   ?  Current Facility-Administered Medications  Medication Dose Route Frequency Provider Last Rate Last Admin  . 0.9 %  sodium chloride infusion   Intravenous PRN Enzo Bi, MD 10 mL/hr at 10/21/19 1417 250 mL at 10/21/19 1417  . acetaminophen (TYLENOL) tablet 500-1,000 mg  500-1,000 mg Oral Q8H PRN Sharen Hones, MD      . albuterol (PROVENTIL) (2.5 MG/3ML) 0.083% nebulizer solution 2.5 mg  2.5 mg Inhalation Q6H PRN Sharen Hones, MD      . Ampicillin-Sulbactam (UNASYN) 3 g in sodium chloride 0.9 % 100 mL IVPB  3 g Intravenous Q6H Sharen Hones, MD 200 mL/hr at 10/21/19 1019 3 g at 10/21/19 1019  . clindamycin (CLEOCIN) IVPB 600 mg  600 mg Intravenous Redmond Pulling, MD 100 mL/hr at 10/21/19 1419 600 mg at 10/21/19 1419  . enoxaparin (LOVENOX) injection 40 mg  40 mg Subcutaneous Q12H Sharen Hones, MD   40 mg at 10/21/19 0903  .  furosemide (LASIX) tablet 20 mg  20 mg Oral Daily Sharen Hones, MD   20 mg at 10/21/19 0903  . loratadine (CLARITIN) tablet 10 mg  10 mg Oral Daily Sharen Hones, MD   10 mg at 10/21/19 0903  . polyethylene glycol (MIRALAX / GLYCOLAX) packet 17 g  17 g Oral Daily PRN Sharen Hones, MD      . sodium chloride flush (NS) 0.9 % injection 3 mL  3 mL Intravenous Once Sharen Hones, MD         Abtx:  Anti-infectives (From admission, onward)   Start     Dose/Rate Route Frequency Ordered Stop   10/20/19 2200  clindamycin (CLEOCIN) IVPB 600 mg     600 mg 100 mL/hr over 30 Minutes Intravenous Every 8 hours 10/20/19 1959     10/20/19 2000  Ampicillin-Sulbactam (UNASYN) 3 g in sodium chloride 0.9 % 100 mL IVPB     3 g 200 mL/hr over 30 Minutes Intravenous Every 6 hours 10/20/19 1959     10/20/19 1800  ceFAZolin (ANCEF) IVPB 1 g/50 mL premix     1 g 100 mL/hr over 30 Minutes Intravenous  Once 10/20/19 1728 10/20/19 1945      REVIEW OF SYSTEMS:  Const: negative fever, negative chills, negative weight loss Eyes: negative diplopia or visual changes, negative eye pain ENT: negative coryza, negative sore throat Resp: negative cough, hemoptysis, dyspnea Cards: negative for chest pain, palpitations, lower extremity edema GU: negative for frequency, dysuria and hematuria GI: Negative for abdominal pain, diarrhea, bleeding, constipation Skin: negative for rash and pruritus Heme: negative for easy bruising and gum/nose bleeding MS: has pain back and joints Neurolo:negative for headaches, dizziness, vertigo, memory problems  Psych: negative for feelings of anxiety, depression  Endocrine: negative for thyroid, diabetes Allergy/Immunology-as above  Objective:  VITALS:  BP 118/67 (BP Location: Right Arm)   Pulse 77   Temp 98.7 F (37.1 C) (Oral)   Resp 17   Ht 5\' 4"  (1.626 m)   Wt (!) 185.1 kg   SpO2 100%   BMI 70.05 kg/m  PHYSICAL EXAM:  General: Alert, cooperative, no distress, appears stated  age.  Head: Normocephalic, without obvious abnormality, atraumatic. Eyes: Conjunctivae clear, anicteric sclerae. Pupils are equal ENT Nares normal. No drainage or sinus tenderness. Lips, mucosa, and tongue normal. No Thrush Neck: Supple, symmetrical, no adenopathy, thyroid: non tender no carotid bruit and no JVD. Back: No CVA tenderness. Lungs: Clear to auscultation bilaterally. No Wheezing or Rhonchi. No rales. Heart: Regular rate and rhythm, no  murmur, rub or gallop. Abdomen: Soft, non-tender,not distended. Bowel sounds normal. No masses Extremities: severe lymphedema legs- rt > left Rt leg is weepy, superficial blister            Skin: No rashes or lesions. Or bruising Lymph: Cervical, supraclavicular normal. Neurologic: Grossly non-focal Pertinent Labs Lab Results CBC    Component Value Date/Time   WBC 5.0 10/21/2019 0541   RBC 4.15 10/21/2019 0541   HGB 10.7 (L) 10/21/2019 0541   HGB 12.8 09/28/2013 1838   HCT 34.8 (L) 10/21/2019 0541   HCT 39.8 09/28/2013 1838   PLT 165 10/21/2019 0541   PLT 222 09/28/2013 1838   MCV 83.9 10/21/2019 0541   MCV 81 09/28/2013 1838   MCH 25.8 (L) 10/21/2019 0541   MCHC 30.7 10/21/2019 0541   RDW 14.6 10/21/2019 0541   RDW 15.5 (H) 09/28/2013 1838   LYMPHSABS 1.1 10/20/2019 1409   LYMPHSABS 2.1 09/28/2013 1838   MONOABS 0.6 10/20/2019 1409   MONOABS 0.8 09/28/2013 1838   EOSABS 0.0 10/20/2019 1409   EOSABS 0.0 09/28/2013 1838   BASOSABS 0.0 10/20/2019 1409   BASOSABS 0.1 09/28/2013 1838    CMP Latest Ref Rng & Units 10/21/2019 10/20/2019 06/07/2019  Glucose 70 - 99 mg/dL 99 98 94  BUN 8 - 23 mg/dL 13 15 19   Creatinine 0.44 - 1.00 mg/dL 0.81 0.91 0.86  Sodium 135 - 145 mmol/L 140 141 140  Potassium 3.5 - 5.1 mmol/L 3.8 3.9 4.1  Chloride 98 - 111 mmol/L 106 105 99  CO2 22 - 32 mmol/L 28 29 28   Calcium 8.9 - 10.3 mg/dL 7.9(L) 8.7(L) 8.4(L)  Total Protein 6.5 - 8.1 g/dL - 7.4 -  Total Bilirubin 0.3 - 1.2 mg/dL - 0.9 -    Alkaline Phos 38 - 126 U/L - 107 -  AST 15 - 41 U/L - 20 -  ALT 0 - 44 U/L - 15 -      Microbiology: Recent Results (from the past 240 hour(s))  Blood culture (single)     Status: None (Preliminary result)   Collection Time: 10/20/19  6:13 PM   Specimen: BLOOD  Result Value Ref Range Status   Specimen Description BLOOD BLOOD RIGHT HAND  Final   Special Requests   Final    BOTTLES DRAWN AEROBIC AND ANAEROBIC Blood Culture adequate volume   Culture   Final    NO GROWTH < 24 HOURS Performed at [facility], [address] [place], [place] [address]    Report Status PENDING  Incomplete  SARS CORONAVIRUS 2 (TAT 6-24 HRS) Nasopharyngeal Nasopharyngeal Swab     Status: None   Collection Time: 10/20/19  6:13 PM   Specimen: Nasopharyngeal Swab  Result Value Ref Range Status   SARS Coronavirus 2 NEGATIVE NEGATIVE Final    Comment: (NOTE) SARS-CoV-2 target nucleic acids are NOT DETECTED. The SARS-CoV-2 RNA is generally detectable in upper and lower respiratory specimens during the acute phase of infection. Negative results do not preclude SARS-CoV-2 infection, do not rule out co-infections with other pathogens, and should not be used as the sole basis for treatment or other patient management decisions. Negative results must be combined with clinical observations, patient history, and epidemiological information. The expected result is Negative. Fact Sheet for Patients: SugarRoll.be Fact Sheet for Healthcare Providers: https://www.woods-mathews.com/ This test is not yet approved or cleared by the [place] FDA and  has been authorized for detection and/or diagnosis of SARS-CoV-2 by FDA under an Emergency  Use Authorization (EUA). This EUA will remain  in effect (meaning this test can be used) for the duration of the COVID-19 declaration under Section 56 4(b)(1) of the Act, 21 U.S.C. section 360bbb-3(b)(1), unless the  authorization is terminated or revoked sooner. Performed at [facility], [place] [address] [place], [place] [address]     IMAGING RESULTS: none I have personally reviewed the films ? Impression/Recommendation ? Severe lymphedema both legs  Now rt is more swollen, with weeping and superficial blister- could be caused by excess salt intake recently. Doubt there is much in the way of infection On unasyn and clinda- will change the unasyn to cefazolin as unasyn does not add any thing beyond anerobic coverage. Will DC clinda Vascular consult Wound care consult for compression wraps    Discussed with patient,

## 2019-10-22 ENCOUNTER — Ambulatory Visit (INDEPENDENT_AMBULATORY_CARE_PROVIDER_SITE_OTHER): Payer: Medicaid Other | Admitting: Vascular Surgery

## 2019-10-22 LAB — MAGNESIUM: Magnesium: 1.9 mg/dL (ref 1.7–2.4)

## 2019-10-22 LAB — BASIC METABOLIC PANEL
Anion gap: 7 (ref 5–15)
BUN: 12 mg/dL (ref 8–23)
CO2: 29 mmol/L (ref 22–32)
Calcium: 8.5 mg/dL — ABNORMAL LOW (ref 8.9–10.3)
Chloride: 104 mmol/L (ref 98–111)
Creatinine, Ser: 0.8 mg/dL (ref 0.44–1.00)
GFR calc Af Amer: >60 mL/min (ref >60)
GFR calc non Af Amer: >60 mL/min (ref >60)
Glucose, Bld: 101 mg/dL — ABNORMAL HIGH (ref 70–99)
Potassium: 4 mmol/L (ref 3.5–5.1)
Sodium: 140 mmol/L (ref 135–145)

## 2019-10-22 LAB — CBC
HCT: 36.7 % (ref 36.0–46.0)
Hemoglobin: 11.3 g/dL — ABNORMAL LOW (ref 12.0–15.0)
MCH: 25.8 pg — ABNORMAL LOW (ref 26.0–34.0)
MCHC: 30.8 g/dL (ref 30.0–36.0)
MCV: 83.8 fL (ref 80.0–100.0)
Platelets: 166 10*3/uL (ref 150–400)
RBC: 4.38 MIL/uL (ref 3.87–5.11)
RDW: 14.3 % (ref 11.5–15.5)
WBC: 4.8 10*3/uL (ref 4.0–10.5)
nRBC: 0 % (ref 0.0–0.2)

## 2019-10-22 MED ORDER — FUROSEMIDE 10 MG/ML IJ SOLN
40.0000 mg | Freq: Once | INTRAMUSCULAR | Status: AC
  Administered 2019-10-22: 18:00:00 40 mg via INTRAVENOUS
  Filled 2019-10-22: qty 4

## 2019-10-22 MED ORDER — FUROSEMIDE 10 MG/ML IJ SOLN
40.0000 mg | Freq: Once | INTRAMUSCULAR | Status: AC
  Administered 2019-10-22: 11:00:00 40 mg via INTRAVENOUS
  Filled 2019-10-22: qty 4

## 2019-10-22 NOTE — TOC Initial Note (Signed)
Transition of Care [facility]) - Initial/Assessment Note    Patient Details  Name: [name] MRN: [id number] Date of Birth: 1955/11/24  Transition of Care [facility]) CM/SW Contact:    Su Hilt, RN Phone Number: 10/22/2019, 2:33 PM  Clinical Narrative:                 Met with the patient to discuss DC needs and plan, She lives at home with her son, She has a hospital bed and a RW, also a cane, she is able to do her own wraps on her legs and no longer goes to the lymphedema clinic.  She stated that she does not need [place] services and does not need DME.  Will continue to monitor for needs        Patient Goals and CMS Choice        Expected Discharge Plan and Services                                                Prior Living Arrangements/Services                       Activities of Daily Living Home Assistive Devices/Equipment: [place] Hospital bed, Cane (specify quad or straight) ADL Screening (condition at time of admission) Patient's cognitive ability adequate to safely complete daily activities?: Yes Is the patient deaf or have difficulty hearing?: No Does the patient have difficulty seeing, even when wearing glasses/contacts?: No Does the patient have difficulty concentrating, remembering, or making decisions?: No Patient able to express need for assistance with ADLs?: Yes Does the patient have difficulty dressing or bathing?: No Independently performs ADLs?: Yes (appropriate for developmental age) Does the patient have difficulty walking or climbing stairs?: Yes Weakness of Legs: None Weakness of Arms/Hands: None  Permission Sought/Granted                  Emotional Assessment              Admission diagnosis:  Lymphedema [I89.0] Leg swelling [M79.89] Cellulitis of right leg [L03.115] Cellulitis of lower extremity, unspecified laterality [L03.119] Patient Active Problem List   Diagnosis Date Noted  . Anemia   .  Cellulitis of left lower extremity 11/03/2018  . Sepsis ([place]) 04/17/2018  . Chest pain with high risk for cardiac etiology 02/04/2017  . Heart palpitations 02/04/2017  . SOB (shortness of breath) on exertion 02/04/2017  . OSA (obstructive sleep apnea) 08/04/2016  . HTN (hypertension) 08/04/2016  . Asthma 08/04/2016  . Screening for colon cancer 05/17/2015  . Cellulitis 01/24/2015  . Cellulitis of right leg 01/23/2015  . Skin rash 12/28/2013  . Tobacco use 12/28/2013  . Spinal stenosis 07/11/2011  . Cataracts, bilateral 05/30/2010  . History of colonic polyps 12/28/2009  . Lymphedema 02/10/2009  . Idiopathic urticaria 02/10/2009  . Morbid obesity ([place]) 02/10/2009  . Other abnormal glucose 02/10/2009  . Essential (primary) hypertension 02/10/2009   PCP:  Ranae Plumber, [place] Pharmacy:   [id number] [address], [place] [address] Phone: [phone] Fax: [phone]  [place], [place] - [place] [address] Phone: [phone] Fax: [phone]     Social Determinants of Health (  SDOH) Interventions    Readmission Risk Interventions No flowsheet data found.

## 2019-10-22 NOTE — Progress Notes (Signed)
ID Pt doing better Rt leg less weepy  Worked with PT and walked a few steps Need wound care consult and compression wraps Continue cefazolin DC clindamycin Discussed the management with the patient

## 2019-10-22 NOTE — Progress Notes (Signed)
PROGRESS NOTE    [name]  [id number] DOB: 1955/08/04 DOA: 10/20/2019 PCP: Ranae Plumber, PA    Assessment & Plan:   Active Problems:   Cellulitis of right leg   [name] is a 64 year old AA female with history of asthma, hypertension, obstructive sleep apnea, chronic lymphedema of the bilateral lower extremity who presented to the hospital with worsening right leg edema, pain and redness.    # Right leg cellulitis  # Chronic severe bilateral lymphedema  Patient was chronically treated with Keflex. --started on Unasyn and clindamycin on admission, and switched to Ancef and clinda, per ID. --IV lasix 40 mg x1 on 4/14 with >5L urine output --IV lasix 40 mg x2 today --Strict I/O --Need to set up for outpatient lymphedema treatment  # Essential hypertension.   --continue home diuretic as IV lasix 40 mg today  # Morbid obesity.    # Obstructive sleep apnea.    # Asthma.  Stable.  No exacerbation.   DVT prophylaxis: Lovenox SQ Code Status: Full code  Family Communication:  Disposition Plan: At least 3 more days for IV abx and treating severe edema.     Subjective and Interval History:  Pt reported some improvement in swelling.  Good urine output in response to IV lasix.  No fever, chest pain, abdominal pain, N/V/D, dysuria.   Objective: Vitals:   10/21/19 1557 10/22/19 0048 10/22/19 0853 10/22/19 1744  BP: 112/60 140/64 135/83 134/66  Pulse: 69 78 72 72  Resp:  16 18 15   Temp: 98.7 F (37.1 C) 98.3 F (36.8 C) 98.4 F (36.9 C) 98.5 F (36.9 C)  TempSrc: Oral Oral Oral Oral  SpO2: 99% 99% 98% 98%  Weight:      Height:        Intake/Output Summary (Last 24 hours) at 10/22/2019 1838 Last data filed at 10/22/2019 1639 Gross per 24 hour  Intake 1969.86 ml  Output 7000 ml  Net -5030.14 ml   Filed Weights   10/20/19 1407  Weight: (!) 185.1 kg    Examination:   Constitutional: NAD, AAOx3 HEENT: conjunctivae and lids normal,  EOMI CV: RRR no M,R,G. Distal pulses +2.  No cyanosis.   RESP: CTA B/L, normal respiratory effort  GI: +BS, NTND Extremities: severe lymphedema in both legs, with erythema, warmth and weeping from the right thigh. SKIN: lichenified skin in both lower legs  Neuro: II - XII grossly intact.  Sensation intact Psych: Normal mood and affect.  Appropriate judgement and reason  Photos taken on 10/22/19         Data Reviewed: I have personally reviewed following labs and imaging studies  CBC: Recent Labs  Lab 10/20/19 1409 10/21/19 0541 10/22/19 0517  WBC 5.8 5.0 4.8  NEUTROABS 4.0  --   --   HGB 13.0 10.7* 11.3*  HCT 42.8 34.8* 36.7  MCV 83.8 83.9 83.8  PLT 189 165 [id number]   Basic Metabolic Panel: Recent Labs  Lab 10/20/19 1409 10/21/19 0541 10/22/19 0517  NA 141 140 140  K 3.9 3.8 4.0  CL 105 106 104  CO2 29 28 29   GLUCOSE 98 99 101*  BUN 15 13 12   CREATININE 0.91 0.81 0.80  CALCIUM 8.7* 7.9* 8.5*  MG  --   --  1.9   GFR: Estimated Creatinine Clearance: 119.9 mL/min (by C-G formula based on SCr of 0.8 mg/dL). Liver Function Tests: Recent Labs  Lab 10/20/19 1409  AST 20  ALT 15  ALKPHOS 107  BILITOT 0.9  PROT 7.4  ALBUMIN 3.4*   No results for input(s): LIPASE, AMYLASE in the last 168 hours. No results for input(s): AMMONIA in the last 168 hours. Coagulation Profile: No results for input(s): INR, PROTIME in the last 168 hours. Cardiac Enzymes: No results for input(s): CKTOTAL, CKMB, CKMBINDEX, TROPONINI in the last 168 hours. BNP (last 3 results) No results for input(s): PROBNP in the last 8760 hours. HbA1C: No results for input(s): HGBA1C in the last 72 hours. CBG: No results for input(s): GLUCAP in the last 168 hours. Lipid Profile: No results for input(s): CHOL, HDL, LDLCALC, TRIG, CHOLHDL, LDLDIRECT in the last 72 hours. Thyroid Function Tests: No results for input(s): TSH, T4TOTAL, FREET4, T3FREE, THYROIDAB in the last 72 hours. Anemia Panel: No  results for input(s): VITAMINB12, FOLATE, FERRITIN, TIBC, IRON, RETICCTPCT in the last 72 hours. Sepsis Labs: Recent Labs  Lab 10/20/19 1409  LATICACIDVEN 1.3    Recent Results (from the past 240 hour(s))  Blood culture (single)     Status: None (Preliminary result)   Collection Time: 10/20/19  6:13 PM   Specimen: BLOOD  Result Value Ref Range Status   Specimen Description BLOOD BLOOD RIGHT HAND  Final   Special Requests   Final    BOTTLES DRAWN AEROBIC AND ANAEROBIC Blood Culture adequate volume   Culture   Final    NO GROWTH 2 DAYS Performed at [facility], [address], [place], [place] [address]    Report Status PENDING  Incomplete  SARS CORONAVIRUS 2 (TAT 6-24 HRS) Nasopharyngeal Nasopharyngeal Swab     Status: None   Collection Time: 10/20/19  6:13 PM   Specimen: Nasopharyngeal Swab  Result Value Ref Range Status   SARS Coronavirus 2 NEGATIVE NEGATIVE Final    Comment: (NOTE) SARS-CoV-2 target nucleic acids are NOT DETECTED. The SARS-CoV-2 RNA is generally detectable in upper and lower respiratory specimens during the acute phase of infection. Negative results do not preclude SARS-CoV-2 infection, do not rule out co-infections with other pathogens, and should not be used as the sole basis for treatment or other patient management decisions. Negative results must be combined with clinical observations, patient history, and epidemiological information. The expected result is Negative. Fact Sheet for Patients: SugarRoll.be Fact Sheet for Healthcare Providers: https://www.woods-mathews.com/ This test is not yet approved or cleared by the [place] FDA and  has been authorized for detection and/or diagnosis of SARS-CoV-2 by FDA under an Emergency Use Authorization (EUA). This EUA will remain  in effect (meaning this test can be used) for the duration of the COVID-19 declaration under Section 56 4(b)(1) of the Act,  21 U.S.C. section 360bbb-3(b)(1), unless the authorization is terminated or revoked sooner. Performed at [facility], [place] [address] [place],  [address]       Radiology Studies: No results found.   Scheduled Meds: . enoxaparin (LOVENOX) injection  40 mg Subcutaneous Q12H  . loratadine  10 mg Oral Daily  . sodium chloride flush  3 mL Intravenous Once   Continuous Infusions: . sodium chloride 250 mL (10/22/19 1458)  .  ceFAZolin (ANCEF) IV 2 g (10/22/19 1502)  . clindamycin (CLEOCIN) IV 600 mg (10/22/19 1634)     LOS: 2 days     Enzo Bi, MD [facility] If 7PM-7AM, please contact night-coverage 10/22/2019, 6:38 PM

## 2019-10-22 NOTE — Evaluation (Signed)
Physical Therapy Evaluation Patient Details Name: [name] MRN: [id number] DOB: October 25, 1955 Today's Date: 10/22/2019   History of Present Illness  64 year old female with history of asthma, hypertension, obstructive sleep apnea, chronic lymphedema of the bilateral lower extremity who presented to the hospital with worsening right leg edema, pain and redness.  Clinical Impression  Very pleasant pt known to this PT from recent hospitalization for similar situation.  She reports that typically she is able to get LEs in/out of bed with holding sheet around foot strategy, she was able to get L LE in/out of bed w/o PT assist but did need additional direct assist with R LE for these transitions.  Otherwise she was able to rise to standing (multiple times from multiple surface) well t/o the session and was able to walk ~85 ft with near baseline ambulation and minimal reliance on the QD.  Overall she reports feeling near her baseline regarding functional mobility (apart from R LE in/out of bed) and states that she is good about being active and walking daily, does not feel the need for PT at d/c once she has gotten LE swelling under control.  Will maintain on caseload to continue working on mobility as well as work on increased ambulation/stair confidence.   Follow Up Recommendations No PT follow up(will maintain on PT caseload for sustained mobility)    Equipment Recommendations  None recommended by PT    Recommendations for Other Services       Precautions / Restrictions Precautions Precautions: Fall Restrictions Weight Bearing Restrictions: No      Mobility  Bed Mobility Overal bed mobility: Needs Assistance Bed Mobility: Supine to Sit;Sit to Supine     Supine to sit: Min assist Sit to supine: Min assist   General bed mobility comments: pt able to assist with R LE using sheet around the foot (uses this strategy at baseline) to get out and back into bed but did need direct assist  from PT to achieve  Transfers Overall transfer level: Modified independent Equipment used: Rolling walker (2 wheeled)             General transfer comment: Pt was able to get on/off of bed and recliner multiple times t/o session.  Uses momentum but good awareness of positioning, etc  Ambulation/Gait Ambulation/Gait assistance: Supervision Gait Distance (Feet): 85 Feet Assistive device: Quad cane       General Gait Details: Pt with wide BOS (necessitated 2/2 LE habitus) but was able to confidently go into the hallway and did not need any direct assist t/o the effort.  Used baseline QC with minimal reliance for actual WBing.  Stairs            Wheelchair Mobility    Modified Rankin (Stroke Patients Only)       Balance Overall balance assessment: Modified Independent(good confidence and safety with sitting and standing balance)                                           Pertinent Vitals/Pain Pain Assessment: No/denies pain    Home Living Family/patient expects to be discharged to:: Private residence Living Arrangements: Children(son works 2 jobs, not around to help much) Available Help at Discharge: Family;Available PRN/intermittently(other family is good about helping PRN)   Home Access: Stairs to enter Entrance Stairs-Rails: None Entrance Stairs-Number of Steps: 1 step into kitchen(neighbor has made platforms to  ease in transitions) Home Layout: One level Home Equipment: Cane - quad;Walker - standard;Bedside commode;Grab bars - tub/shower;Hand held shower head;Grab bars - toilet;Toilet riser;Hospital bed      Prior Function Level of Independence: Independent with assistive device(s)         Comments: Pt does her own housework, cooking, etc, takes med transport to MDs, walks out of the home almost QD     Hand Dominance        Extremity/Trunk Assessment   Upper Extremity Assessment Upper Extremity Assessment: Generalized weakness     Lower Extremity Assessment Lower Extremity Assessment: Generalized weakness(excessive acute on chronic lymphedema in b/l LEs)       Communication   Communication: No difficulties  Cognition Arousal/Alertness: Awake/alert Behavior During Therapy: WFL for tasks assessed/performed Overall Cognitive Status: Within Functional Limits for tasks assessed                                        General Comments General comments (skin integrity, edema, etc.): pt was able to fit in recliner but legs were too heavy for     Exercises     Assessment/Plan    PT Assessment Patient needs continued PT services  PT Problem List Decreased activity tolerance;Decreased mobility;Decreased range of motion;Decreased safety awareness       PT Treatment Interventions DME instruction;Gait training;Stair training;Functional mobility training;Therapeutic activities;Therapeutic exercise;Balance training;Cognitive remediation;Patient/family education    PT Goals (Current goals can be found in the Care Plan section)  Acute Rehab PT Goals Patient Stated Goal: get swelling/weeping down in legs PT Goal Formulation: With patient Time For Goal Achievement: 11/05/19 Potential to Achieve Goals: Good    Frequency Min 2X/week   Barriers to discharge        Co-evaluation               AM-PAC PT "6 Clicks" Mobility  Outcome Measure Help needed turning from your back to your side while in a flat bed without using bedrails?: A Little Help needed moving from lying on your back to sitting on the side of a flat bed without using bedrails?: A Little Help needed moving to and from a bed to a chair (including a wheelchair)?: None Help needed standing up from a chair using your arms (e.g., wheelchair or bedside chair)?: None Help needed to walk in hospital room?: None Help needed climbing 3-5 steps with a railing? : A Little 6 Click Score: 21    End of Session Equipment Utilized During  Treatment: Gait belt Activity Tolerance: Patient tolerated treatment well;Patient limited by fatigue Patient left: with bed alarm set;with call bell/phone within reach Nurse Communication: Mobility status(need for bariatric recliner) PT Visit Diagnosis: Muscle weakness (generalized) (M62.81);Difficulty in walking, not elsewhere classified (R26.2);Other abnormalities of gait and mobility (R26.89)    Time: [id number] PT Time Calculation (min) (ACUTE ONLY): 40 min   Charges:   PT Evaluation $PT Eval Low Complexity: 1 Low PT Treatments $Gait Training: 8-22 mins $Therapeutic Activity: 8-22 mins        Kreg Shropshire, DPT 10/22/2019, 2:08 PM

## 2019-10-23 DIAGNOSIS — I890 Lymphedema, not elsewhere classified: Secondary | ICD-10-CM | POA: Diagnosis not present

## 2019-10-23 DIAGNOSIS — L03115 Cellulitis of right lower limb: Secondary | ICD-10-CM | POA: Diagnosis not present

## 2019-10-23 DIAGNOSIS — L304 Erythema intertrigo: Secondary | ICD-10-CM

## 2019-10-23 LAB — MAGNESIUM: Magnesium: 1.9 mg/dL (ref 1.7–2.4)

## 2019-10-23 LAB — CBC
HCT: 35.5 % — ABNORMAL LOW (ref 36.0–46.0)
Hemoglobin: 11.3 g/dL — ABNORMAL LOW (ref 12.0–15.0)
MCH: 26.1 pg (ref 26.0–34.0)
MCHC: 31.8 g/dL (ref 30.0–36.0)
MCV: 82 fL (ref 80.0–100.0)
Platelets: 188 10*3/uL (ref 150–400)
RBC: 4.33 MIL/uL (ref 3.87–5.11)
RDW: 14.5 % (ref 11.5–15.5)
WBC: 4.7 10*3/uL (ref 4.0–10.5)
nRBC: 0 % (ref 0.0–0.2)

## 2019-10-23 LAB — BASIC METABOLIC PANEL
Anion gap: 10 (ref 5–15)
BUN: 13 mg/dL (ref 8–23)
CO2: 30 mmol/L (ref 22–32)
Calcium: 8.2 mg/dL — ABNORMAL LOW (ref 8.9–10.3)
Chloride: 98 mmol/L (ref 98–111)
Creatinine, Ser: 0.89 mg/dL (ref 0.44–1.00)
GFR calc Af Amer: >60 mL/min (ref >60)
GFR calc non Af Amer: >60 mL/min (ref >60)
Glucose, Bld: 95 mg/dL (ref 70–99)
Potassium: 4 mmol/L (ref 3.5–5.1)
Sodium: 138 mmol/L (ref 135–145)

## 2019-10-23 MED ORDER — FUROSEMIDE 10 MG/ML IJ SOLN
40.0000 mg | Freq: Two times a day (BID) | INTRAMUSCULAR | Status: DC
  Administered 2019-10-23 – 2019-10-25 (×5): 40 mg via INTRAVENOUS
  Filled 2019-10-23 (×5): qty 4

## 2019-10-23 MED ORDER — PIPERACILLIN-TAZOBACTAM 3.375 G IVPB
3.3750 g | Freq: Three times a day (TID) | INTRAVENOUS | Status: DC
  Administered 2019-10-23 – 2019-10-28 (×15): 3.375 g via INTRAVENOUS
  Filled 2019-10-23 (×18): qty 50

## 2019-10-23 MED ORDER — FLUCONAZOLE 100 MG PO TABS
200.0000 mg | ORAL_TABLET | Freq: Every day | ORAL | Status: AC
  Administered 2019-10-24 – 2019-10-27 (×4): 200 mg via ORAL
  Filled 2019-10-23 (×5): qty 2

## 2019-10-23 NOTE — Telephone Encounter (Signed)
Sent to ED

## 2019-10-23 NOTE — Consult Note (Addendum)
WOC Nurse Consult Note: Reason for Consult: weeping, cracking LE related to long standing history of lymphedema   Patient self reports familial lymphedema for at least 15 years. Brother who died from complications from lymphedema   Wound type: Partial thickness ulceration; right inner thigh Large area; essentially entire posterior right thigh weeping due to dependent tissue from lymphedema  Pressure Injury POA: NA  Measurement: 2cm x 4cm x 0.1cm   Wound bed:100% fibrinous/yellow/macererated tissue  Drainage (amount, consistency, odor) serous  Periwound: severe bilateral LE lymphedema  Dressing procedure/placement/frequency: No real topical care that can be provided.  Compression that is offered inpatient is not therapeutic and can be detrimental to the skin in this severe a case of lymphedema. It as well is indicated for venous stasis not lymphedema. Verified with inpatient  Spoke with patient about options for tx. She has been seen by outpatient Lymphedema clinic last in around "2000" but was turned aware. Unclear why.  Today Matthias Hughs explained that if she has LE wounds they will not take her.  Currently I feel the drainage is from the severity of the edema and the ulceration is from the moisture between the thighs of the patient.  She has lymphedema pumps from 2 years ago but they do not fit her any longer and the current pumps "were doing more damage" per the patient. Patient is well informed and knowledgeable about her current chronic problem and the need for long term management.   She doesn't seem to want to go back to [facility] but I will discuss this with her further.   She will need referral from hospitialist at [facility] and the rehab department will request signed referral after that from patient's primary care MD.  I will verify she has this in place.   I will ask CM as well to be involved if needed.   I have given patient lymphedema resource sheet; and attached information to this  note.   Lymphedema  Resources (updated July 2019 ) Each site requires a referral from your primary care MD [place], [place]  [phone] (Upper extremities)  [place], [place] [phone] (Lower extremities)  [place] [address] [place], [place] [address] [phone] [facility] [address] [place], [place] [address] [phone] [place], [id number] Medical Office Building [place], [place] [phone]  [facility] [address] [place], [place] [address] [phone]  [facility]  (only treatment for lymphedema related to cancer diagnosis) [place], [place] [address] [phone]    [facility] [address] [place], [place] [address] [phone]  [facility] [address] [place], [place] [address] [phone] [facility] [address] [place], [place] [address] [phone]    Discussed POC with patient and bedside nurse.  Re consult if needed, will not follow at this time. Thanks  Vickie Ponds [employer], RN,CWOCN, CNS, [place] [phone])

## 2019-10-23 NOTE — Progress Notes (Signed)
Pharmacy Antibiotic Note  [name] is a 64 y.o. female admitted on 10/20/2019 with wound infection.  Pharmacy has been consulted for Zosyn dosing.  Plan: Zosyn 3.375g IV q8h (4 hour infusion).  Height: 5\' 4"  (162.6 cm) Weight: (!) 185.1 kg (408 lb 1.1 oz) IBW/kg (Calculated) : 54.7  Temp (24hrs), Avg:98.4 F (36.9 C), Min:98.2 F (36.8 C), Max:98.8 F (37.1 C)  Recent Labs  Lab 10/20/19 1409 10/21/19 0541 10/22/19 0517 10/23/19 0528  WBC 5.8 5.0 4.8 4.7  CREATININE 0.91 0.81 0.80 0.89  LATICACIDVEN 1.3  --   --   --     Estimated Creatinine Clearance: 107.8 mL/min (by C-G formula based on SCr of 0.89 mg/dL).    Allergies  Allergen Reactions  . Vancomycin Itching and Nausea And Vomiting  . Ace Inhibitors Hives and Swelling  . Ibuprofen Hives and Swelling    Antimicrobials this admission:   >>    >>   Dose adjustments this admission:   Microbiology results:  BCx:   UCx:    Sputum:    MRSA PCR:   Thank you for allowing pharmacy to be a part of this patient's care.  Kye Hedden D 10/23/2019 6:34 PM

## 2019-10-23 NOTE — Progress Notes (Signed)
PROGRESS NOTE    [name]  [id number] DOB: [birth year] DOA: 10/20/2019 PCP: Ranae Plumber, PA    Assessment & Plan:   Active Problems:   Cellulitis of right leg   [name] is a 64 year old AA female with history of asthma, hypertension, obstructive sleep apnea, chronic lymphedema of the bilateral lower extremity who presented to the hospital with worsening right leg edema, pain and redness.    # Right leg cellulitis  # Chronic severe bilateral lymphedema  Patient was chronically treated with Keflex. --started on Unasyn and clindamycin on admission, and switched to Ancef and clinda, per ID. --IV lasix 40 mg x1 on 4/14 with >5L urine output per day PLAN: --continue IV lasix 40 mg BID --Abx switched to zosyn to cover pseudomonas today, per ID --Strict I/O --Need to set up for outpatient lymphedema treatment  # Essential hypertension.   --continue home diuretic as IV lasix   # Morbid obesity.    # Obstructive sleep apnea.    # Asthma.  Stable.  No exacerbation.   DVT prophylaxis: Lovenox SQ Code Status: Full code  Family Communication:  Disposition Plan: Continue IV diuresis until Cr starts to bump, to remove as much fluid as possible, also still on IV zosyn.  Discharge home, early next week.   Subjective and Interval History:  Pt reported definitive improvement in swelling.  A lot of urine output in response to IV lasix.  No fever, chest pain, abdominal pain, N/V/D, dysuria.  Wound care consulted today.   Objective: Vitals:   10/22/19 1744 10/22/19 2247 10/23/19 1023 10/23/19 1534  BP: 134/66 (!) 138/125 (!) 107/95 (!) 145/76  Pulse: 72 77 76 73  Resp: 15 17 20 18   Temp: 98.5 F (36.9 C) 98.8 F (37.1 C) 98.3 F (36.8 C) 98.2 F (36.8 C)  TempSrc: Oral Oral Oral Oral  SpO2: 98% 92% 94% 97%  Weight:      Height:        Intake/Output Summary (Last 24 hours) at 10/23/2019 1852 Last data filed at 10/23/2019 1836 Gross per 24 hour   Intake --  Output 3650 ml  Net -3650 ml   Filed Weights   10/20/19 1407  Weight: (!) 185.1 kg    Examination:   Constitutional: NAD, AAOx3 HEENT: conjunctivae and lids normal, EOMI CV: RRR no M,R,G. Distal pulses +2.  No cyanosis.   RESP: CTA B/L, normal respiratory effort  GI: +BS, NTND Extremities: severe lymphedema in both legs, with erythema, warmth and weeping from the right thigh (yellow/green).  Swelling reduced, with now some folds in the skin in the left thigh. SKIN: lichenified skin in both lower legs  Neuro: II - XII grossly intact.  Sensation intact Psych: Normal mood and affect.  Appropriate judgement and reason  Photos taken on 10/22/19         Data Reviewed: I have personally reviewed following labs and imaging studies  CBC: Recent Labs  Lab 10/20/19 1409 10/21/19 0541 10/22/19 0517 10/23/19 0528  WBC 5.8 5.0 4.8 4.7  NEUTROABS 4.0  --   --   --   HGB 13.0 10.7* 11.3* 11.3*  HCT 42.8 34.8* 36.7 35.5*  MCV 83.8 83.9 83.8 82.0  PLT 189 165 166 0000000   Basic Metabolic Panel: Recent Labs  Lab 10/20/19 1409 10/21/19 0541 10/22/19 0517 10/23/19 0528  NA 141 140 140 138  K 3.9 3.8 4.0 4.0  CL 105 106 104 98  CO2 29 28 29  30  GLUCOSE 98 99 101* 95  BUN 15 13 12 13   CREATININE 0.91 0.81 0.80 0.89  CALCIUM 8.7* 7.9* 8.5* 8.2*  MG  --   --  1.9 1.9   GFR: Estimated Creatinine Clearance: 107.8 mL/min (by C-G formula based on SCr of 0.89 mg/dL). Liver Function Tests: Recent Labs  Lab 10/20/19 1409  AST 20  ALT 15  ALKPHOS 107  BILITOT 0.9  PROT 7.4  ALBUMIN 3.4*   No results for input(s): LIPASE, AMYLASE in the last 168 hours. No results for input(s): AMMONIA in the last 168 hours. Coagulation Profile: No results for input(s): INR, PROTIME in the last 168 hours. Cardiac Enzymes: No results for input(s): CKTOTAL, CKMB, CKMBINDEX, TROPONINI in the last 168 hours. BNP (last 3 results) No results for input(s): PROBNP in the last 8760  hours. HbA1C: No results for input(s): HGBA1C in the last 72 hours. CBG: No results for input(s): GLUCAP in the last 168 hours. Lipid Profile: No results for input(s): CHOL, HDL, LDLCALC, TRIG, CHOLHDL, LDLDIRECT in the last 72 hours. Thyroid Function Tests: No results for input(s): TSH, T4TOTAL, FREET4, T3FREE, THYROIDAB in the last 72 hours. Anemia Panel: No results for input(s): VITAMINB12, FOLATE, FERRITIN, TIBC, IRON, RETICCTPCT in the last 72 hours. Sepsis Labs: Recent Labs  Lab 10/20/19 1409  LATICACIDVEN 1.3    Recent Results (from the past 240 hour(s))  Blood culture (single)     Status: None (Preliminary result)   Collection Time: 10/20/19  6:13 PM   Specimen: BLOOD  Result Value Ref Range Status   Specimen Description BLOOD BLOOD RIGHT HAND  Final   Special Requests   Final    BOTTLES DRAWN AEROBIC AND ANAEROBIC Blood Culture adequate volume   Culture   Final    NO GROWTH 3 DAYS Performed at [facility], [address] [place], [place] [address]    Report Status PENDING  Incomplete  SARS CORONAVIRUS 2 (TAT 6-24 HRS) Nasopharyngeal Nasopharyngeal Swab     Status: None   Collection Time: 10/20/19  6:13 PM   Specimen: Nasopharyngeal Swab  Result Value Ref Range Status   SARS Coronavirus 2 NEGATIVE NEGATIVE Final    Comment: (NOTE) SARS-CoV-2 target nucleic acids are NOT DETECTED. The SARS-CoV-2 RNA is generally detectable in upper and lower respiratory specimens during the acute phase of infection. Negative results do not preclude SARS-CoV-2 infection, do not rule out co-infections with other pathogens, and should not be used as the sole basis for treatment or other patient management decisions. Negative results must be combined with clinical observations, patient history, and epidemiological information. The expected result is Negative. Fact Sheet for Patients: SugarRoll.be Fact Sheet for Healthcare  Providers: https://www.woods-mathews.com/ This test is not yet approved or cleared by the [place] FDA and  has been authorized for detection and/or diagnosis of SARS-CoV-2 by FDA under an Emergency Use Authorization (EUA). This EUA will remain  in effect (meaning this test can be used) for the duration of the COVID-19 declaration under Section 56 4(b)(1) of the Act, 21 U.S.C. section 360bbb-3(b)(1), unless the authorization is terminated or revoked sooner. Performed at [facility], [place] [address] [place], [place] [address]       Radiology Studies: No results found.   Scheduled Meds: . enoxaparin (LOVENOX) injection  40 mg Subcutaneous Q12H  . fluconazole  200 mg Oral Daily  . furosemide  40 mg Intravenous BID  . loratadine  10 mg Oral Daily  . sodium chloride flush  3 mL Intravenous Once   Continuous Infusions: . sodium chloride 250 mL (10/22/19 1458)  . piperacillin-tazobactam (ZOSYN)  IV       LOS: 3 days     Enzo Bi, MD [facility] If 7PM-7AM, please contact night-coverage 10/23/2019, 6:52 PM

## 2019-10-23 NOTE — Progress Notes (Signed)
ID Pt diuresing a lot Left leg swelling better  rt leg still very swollen Greenish discharge  Patient Vitals for the past 24 hrs:  BP Temp Temp src Pulse Resp SpO2  10/23/19 1534 (!) 145/76 98.2 F (36.8 C) Oral 73 18 97 %  10/23/19 1023 (!) 107/95 98.3 F (36.8 C) Oral 76 20 94 %  10/22/19 2247 (!) 138/125 98.8 F (37.1 C) Oral 77 17 92 %    Rt leg Severe lymphedema- over the inner thigh and back of the leg- area of erythemaotus peau de orange  Skin with fissuring and greenish discharge     CBC Latest Ref Rng & Units 10/23/2019 10/22/2019 10/21/2019  WBC 4.0 - 10.5 K/uL 4.7 4.8 5.0  Hemoglobin 12.0 - 15.0 g/dL 11.3(L) 11.3(L) 10.7(L)  Hematocrit 36.0 - 46.0 % 35.5(L) 36.7 34.8(L)  Platelets 150 - 400 K/uL 188 166 165   CMP Latest Ref Rng & Units 10/23/2019 10/22/2019 10/21/2019  Glucose 70 - 99 mg/dL 95 101(H) 99  BUN 8 - 23 mg/dL 13 12 13   Creatinine 0.44 - 1.00 mg/dL 0.89 0.80 0.81  Sodium 135 - 145 mmol/L 138 140 140  Potassium 3.5 - 5.1 mmol/L 4.0 4.0 3.8  Chloride 98 - 111 mmol/L 98 104 106  CO2 22 - 32 mmol/L 30 29 28   Calcium 8.9 - 10.3 mg/dL 8.2(L) 8.5(L) 7.9(L)  Total Protein 6.5 - 8.1 g/dL - - -  Total Bilirubin 0.3 - 1.2 mg/dL - - -  Alkaline Phos 38 - 126 U/L - - -  AST 15 - 41 U/L - - -  ALT 0 - 44 U/L - - -    Impression/recommendation Severe lymphedema with cellulitis  greenish fluid discharge- likely pseudomonas superinfection Also has intertrigo Will DC cefazolin- will start zosyn and PO fluconazole Will follow her remotely this weekend Discussed the management with patient and her nurse

## 2019-10-24 LAB — CBC
HCT: 36.6 % (ref 36.0–46.0)
Hemoglobin: 11.3 g/dL — ABNORMAL LOW (ref 12.0–15.0)
MCH: 25.5 pg — ABNORMAL LOW (ref 26.0–34.0)
MCHC: 30.9 g/dL (ref 30.0–36.0)
MCV: 82.4 fL (ref 80.0–100.0)
Platelets: 195 10*3/uL (ref 150–400)
RBC: 4.44 MIL/uL (ref 3.87–5.11)
RDW: 14.5 % (ref 11.5–15.5)
WBC: 5.1 10*3/uL (ref 4.0–10.5)
nRBC: 0 % (ref 0.0–0.2)

## 2019-10-24 LAB — MAGNESIUM: Magnesium: 1.8 mg/dL (ref 1.7–2.4)

## 2019-10-24 LAB — BASIC METABOLIC PANEL
Anion gap: 10 (ref 5–15)
BUN: 14 mg/dL (ref 8–23)
CO2: 32 mmol/L (ref 22–32)
Calcium: 8.2 mg/dL — ABNORMAL LOW (ref 8.9–10.3)
Chloride: 97 mmol/L — ABNORMAL LOW (ref 98–111)
Creatinine, Ser: 0.91 mg/dL (ref 0.44–1.00)
GFR calc Af Amer: >60 mL/min (ref >60)
GFR calc non Af Amer: >60 mL/min (ref >60)
Glucose, Bld: 97 mg/dL (ref 70–99)
Potassium: 4.2 mmol/L (ref 3.5–5.1)
Sodium: 139 mmol/L (ref 135–145)

## 2019-10-24 MED ORDER — KETOTIFEN FUMARATE 0.025 % OP SOLN
1.0000 [drp] | Freq: Two times a day (BID) | OPHTHALMIC | Status: DC | PRN
  Administered 2019-10-24: 10:00:00 1 [drp] via OPHTHALMIC
  Filled 2019-10-24: qty 5

## 2019-10-24 NOTE — Plan of Care (Signed)

## 2019-10-24 NOTE — Progress Notes (Signed)
PROGRESS NOTE    [name]  [id number] DOB: December 26, 1955 DOA: 10/20/2019 PCP: Ranae Plumber, PA    Assessment & Plan:   Active Problems:   Cellulitis of right leg   [name] is a 64 year old AA female with history of asthma, hypertension, obstructive sleep apnea, chronic lymphedema of the bilateral lower extremity who presented to the hospital with worsening right leg edema, pain and redness.    # Right leg cellulitis  # Chronic severe bilateral lymphedema  Patient was chronically treated with Keflex. --started on Unasyn and clindamycin on admission, and switched to Ancef and clinda, per ID. --IV lasix 40 mg x1 on 4/14 with >5L urine output per day PLAN: --continue IV lasix 40 mg BID --continue zosyn to cover pseudomonas, per ID --Strict I/O --Need to set up for outpatient lymphedema treatment  # Essential hypertension.   --continue home diuretic as IV lasix   # Morbid obesity.    # Obstructive sleep apnea.    # Asthma.  Stable.  No exacerbation.   DVT prophylaxis: Lovenox SQ Code Status: Full code  Family Communication:  Disposition Plan: Continue IV diuresis until Cr starts to bump, to remove as much fluid as possible, also still on IV zosyn.  Discharge home, early next week.   Subjective and Interval History:  >3L urine output for the past day.  Net -16L so far.  Pt said she now could walk!  Mild pain in her right lower leg.  No fever, dyspnea, chest pain, abdominal pain, N/V/D, dysuria.  Swelling improved in her left leg.   Objective: Vitals:   10/23/19 2123 10/24/19 0038 10/24/19 0808 10/24/19 0812  BP: 128/75 120/63  102/70  Pulse: 75 81 71 73  Resp: 16 16 18    Temp: 98.9 F (37.2 C) 98.8 F (37.1 C)  98.2 F (36.8 C)  TempSrc: Oral Oral  Oral  SpO2: 99% 90% 97% 98%  Weight:      Height:        Intake/Output Summary (Last 24 hours) at 10/24/2019 1549 Last data filed at 10/24/2019 1305 Gross per 24 hour  Intake --   Output 3650 ml  Net -3650 ml   Filed Weights   10/20/19 1407  Weight: (!) 185.1 kg    Examination:   Constitutional: NAD, AAOx3 HEENT: conjunctivae and lids normal, EOMI CV: RRR no M,R,G. Distal pulses +2.  No cyanosis.   RESP: CTA B/L, normal respiratory effort  GI: +BS, NTND Extremities: severe lymphedema in both legs, with erythema, warmth and weeping from the right thigh (yellow/green).  Right lower leg less red.  Swelling reduced, with now some folds in the skin in the left thigh. SKIN: lichenified skin in both lower legs  Neuro: II - XII grossly intact.  Sensation intact Psych: Normal mood and affect.  Appropriate judgement and reason  Photos taken on 10/22/19         Data Reviewed: I have personally reviewed following labs and imaging studies  CBC: Recent Labs  Lab 10/20/19 1409 10/21/19 0541 10/22/19 0517 10/23/19 0528 10/24/19 0400  WBC 5.8 5.0 4.8 4.7 5.1  NEUTROABS 4.0  --   --   --   --   HGB 13.0 10.7* 11.3* 11.3* 11.3*  HCT 42.8 34.8* 36.7 35.5* 36.6  MCV 83.8 83.9 83.8 82.0 82.4  PLT 189 165 166 188 0000000   Basic Metabolic Panel: Recent Labs  Lab 10/20/19 1409 10/21/19 0541 10/22/19 0517 10/23/19 0528 10/24/19 0400  NA 141  140 140 138 139  K 3.9 3.8 4.0 4.0 4.2  CL 105 106 104 98 97*  CO2 29 28 29 30  32  GLUCOSE 98 99 101* 95 97  BUN 15 13 12 13 14   CREATININE 0.91 0.81 0.80 0.89 0.91  CALCIUM 8.7* 7.9* 8.5* 8.2* 8.2*  MG  --   --  1.9 1.9 1.8   GFR: Estimated Creatinine Clearance: 105.4 mL/min (by C-G formula based on SCr of 0.91 mg/dL). Liver Function Tests: Recent Labs  Lab 10/20/19 1409  AST 20  ALT 15  ALKPHOS 107  BILITOT 0.9  PROT 7.4  ALBUMIN 3.4*   No results for input(s): LIPASE, AMYLASE in the last 168 hours. No results for input(s): AMMONIA in the last 168 hours. Coagulation Profile: No results for input(s): INR, PROTIME in the last 168 hours. Cardiac Enzymes: No results for input(s): CKTOTAL, CKMB, CKMBINDEX,  TROPONINI in the last 168 hours. BNP (last 3 results) No results for input(s): PROBNP in the last 8760 hours. HbA1C: No results for input(s): HGBA1C in the last 72 hours. CBG: No results for input(s): GLUCAP in the last 168 hours. Lipid Profile: No results for input(s): CHOL, HDL, LDLCALC, TRIG, CHOLHDL, LDLDIRECT in the last 72 hours. Thyroid Function Tests: No results for input(s): TSH, T4TOTAL, FREET4, T3FREE, THYROIDAB in the last 72 hours. Anemia Panel: No results for input(s): VITAMINB12, FOLATE, FERRITIN, TIBC, IRON, RETICCTPCT in the last 72 hours. Sepsis Labs: Recent Labs  Lab 10/20/19 1409  LATICACIDVEN 1.3    Recent Results (from the past 240 hour(s))  Blood culture (single)     Status: None (Preliminary result)   Collection Time: 10/20/19  6:13 PM   Specimen: BLOOD  Result Value Ref Range Status   Specimen Description BLOOD BLOOD RIGHT HAND  Final   Special Requests   Final    BOTTLES DRAWN AEROBIC AND ANAEROBIC Blood Culture adequate volume   Culture   Final    NO GROWTH 4 DAYS Performed at [facility], [address], [place], [place] [address]    Report Status PENDING  Incomplete  SARS CORONAVIRUS 2 (TAT 6-24 HRS) Nasopharyngeal Nasopharyngeal Swab     Status: None   Collection Time: 10/20/19  6:13 PM   Specimen: Nasopharyngeal Swab  Result Value Ref Range Status   SARS Coronavirus 2 NEGATIVE NEGATIVE Final    Comment: (NOTE) SARS-CoV-2 target nucleic acids are NOT DETECTED. The SARS-CoV-2 RNA is generally detectable in upper and lower respiratory specimens during the acute phase of infection. Negative results do not preclude SARS-CoV-2 infection, do not rule out co-infections with other pathogens, and should not be used as the sole basis for treatment or other patient management decisions. Negative results must be combined with clinical observations, patient history, and epidemiological information. The expected result is Negative. Fact Sheet  for Patients: SugarRoll.be Fact Sheet for Healthcare Providers: https://www.woods-mathews.com/ This test is not yet approved or cleared by the [place] FDA and  has been authorized for detection and/or diagnosis of SARS-CoV-2 by FDA under an Emergency Use Authorization (EUA). This EUA will remain  in effect (meaning this test can be used) for the duration of the COVID-19 declaration under Section 56 4(b)(1) of the Act, 21 U.S.C. section 360bbb-3(b)(1), unless the authorization is terminated or revoked sooner. Performed at [facility], [place] [address] [place], [place] [address]   Aerobic Culture (superficial specimen)     Status: None (Preliminary result)   Collection Time: 10/23/19  6:23 PM  Specimen: Wound  Result Value Ref Range Status   Specimen Description   Final    WOUND RIGHT LEG Performed at [facility], [place] [address] [place], [place] [address]    Special Requests   Final    NONE Performed at [facility], [address] [place], [place] [address]    Gram Stain   Final    NO WBC SEEN ABUNDANT GRAM NEGATIVE RODS FEW GRAM POSITIVE COCCI    Culture   Final    CULTURE REINCUBATED FOR BETTER GROWTH Performed at [facility], [place] [address], [place],  [address]    Report Status PENDING  Incomplete      Radiology Studies: No results found.   Scheduled Meds: . enoxaparin (LOVENOX) injection  40 mg Subcutaneous Q12H  . fluconazole  200 mg Oral Daily  . furosemide  40 mg Intravenous BID  . loratadine  10 mg Oral Daily  . sodium chloride flush  3 mL Intravenous Once   Continuous Infusions: . sodium chloride 250 mL (10/22/19 1458)  . piperacillin-tazobactam (ZOSYN)  IV 3.375 g (10/24/19 1353)     LOS: 4 days     Enzo Bi, MD [facility] If 7PM-7AM, please contact night-coverage 10/24/2019, 3:49 PM

## 2019-10-25 LAB — BASIC METABOLIC PANEL
Anion gap: 9 (ref 5–15)
Anion gap: 8 (ref 5–15)
BUN: 16 mg/dL (ref 8–23)
BUN: 18 mg/dL (ref 8–23)
CO2: 33 mmol/L — ABNORMAL HIGH (ref 22–32)
CO2: 34 mmol/L — ABNORMAL HIGH (ref 22–32)
Calcium: 8.4 mg/dL — ABNORMAL LOW (ref 8.9–10.3)
Calcium: 8.3 mg/dL — ABNORMAL LOW (ref 8.9–10.3)
Chloride: 96 mmol/L — ABNORMAL LOW (ref 98–111)
Chloride: 96 mmol/L — ABNORMAL LOW (ref 98–111)
Creatinine, Ser: 1.02 mg/dL — ABNORMAL HIGH (ref 0.44–1.00)
Creatinine, Ser: 1.02 mg/dL — ABNORMAL HIGH (ref 0.44–1.00)
GFR calc Af Amer: >60 mL/min (ref >60)
GFR calc Af Amer: >60 mL/min (ref >60)
GFR calc non Af Amer: 58 mL/min — ABNORMAL LOW (ref >60)
GFR calc non Af Amer: 58 mL/min — ABNORMAL LOW (ref >60)
Glucose, Bld: 97 mg/dL (ref 70–99)
Glucose, Bld: 105 mg/dL — ABNORMAL HIGH (ref 70–99)
Potassium: 3.7 mmol/L (ref 3.5–5.1)
Potassium: 4.3 mmol/L (ref 3.5–5.1)
Sodium: 138 mmol/L (ref 135–145)
Sodium: 138 mmol/L (ref 135–145)

## 2019-10-25 LAB — CBC
HCT: 36.7 % (ref 36.0–46.0)
Hemoglobin: 11.7 g/dL — ABNORMAL LOW (ref 12.0–15.0)
MCH: 26.2 pg (ref 26.0–34.0)
MCHC: 31.9 g/dL (ref 30.0–36.0)
MCV: 82.3 fL (ref 80.0–100.0)
Platelets: 196 10*3/uL (ref 150–400)
RBC: 4.46 MIL/uL (ref 3.87–5.11)
RDW: 14.5 % (ref 11.5–15.5)
WBC: 5.6 10*3/uL (ref 4.0–10.5)
nRBC: 0 % (ref 0.0–0.2)

## 2019-10-25 LAB — CULTURE, BLOOD (SINGLE)
Culture: NO GROWTH
Special Requests: ADEQUATE

## 2019-10-25 LAB — MAGNESIUM: Magnesium: 2 mg/dL (ref 1.7–2.4)

## 2019-10-25 MED ORDER — FUROSEMIDE 10 MG/ML IJ SOLN
40.0000 mg | Freq: Once | INTRAMUSCULAR | Status: AC
  Administered 2019-10-25: 18:00:00 40 mg via INTRAVENOUS
  Filled 2019-10-25: qty 4

## 2019-10-25 NOTE — Progress Notes (Addendum)
PROGRESS NOTE    [name]  [id number] DOB: 30-Jan-1956 DOA: 10/20/2019 PCP: Ranae Plumber, PA    Assessment & Plan:   Active Problems:   Cellulitis of right leg   [name] is a 64 year old AA female with history of asthma, hypertension, obstructive sleep apnea, chronic lymphedema of the bilateral lower extremity who presented to the hospital with worsening right leg edema, pain and redness.    # Right leg cellulitis, improved # Chronic severe bilateral lymphedema  Patient was chronically treated with Keflex. --started on Unasyn and clindamycin on admission, and switched to Ancef and clinda, per ID. --IV lasix 40 BID from 4/15 with >5L urine output per day, net -20L so far. PLAN: --continue IV lasix 40 mg BID while monitoring Cr  --continue zosyn to cover pseudomonas, per ID --Strict I/O and daily standing weight --Need to set up for outpatient lymphedema treatment  # Essential hypertension.   --continue home diuretic as IV lasix   # Morbid obesity.    # Obstructive sleep apnea.    # Asthma.  Stable.  No exacerbation.   DVT prophylaxis: Lovenox SQ Code Status: Full code  Family Communication:  Disposition Plan: Continue aggressive IV diuresis until Cr starts to increase and urine output decreases, to remove as much fluid as possible, given the extend of the extra fluids on board.  Also still on IV zosyn.  Discharge home, early next week.   Subjective and Interval History:  >7L urine output for the past day.  Net -20L so far.  No fever, dyspnea, chest pain, abdominal pain, N/V/D, dysuria.  Swelling improved in her left leg.   Objective: Vitals:   10/24/19 2334 10/25/19 0717 10/25/19 1419 10/25/19 1503  BP: (!) 133/113 125/68  114/68  Pulse: 79 74  76  Resp: 16 20  20   Temp: 98 F (36.7 C) 98 F (36.7 C)  98 F (36.7 C)  TempSrc: Oral Oral  Oral  SpO2: 96% 90%  96%  Weight:   (!) 168.9 kg   Height:        Intake/Output Summary  (Last 24 hours) at 10/25/2019 1508 Last data filed at 10/25/2019 1334 Gross per 24 hour  Intake 922.73 ml  Output 7250 ml  Net -6327.27 ml   Filed Weights   10/20/19 1407 10/25/19 1419  Weight: (!) 185.1 kg (!) 168.9 kg    Examination:   Constitutional: NAD, AAOx3 HEENT: conjunctivae and lids normal, EOMI CV: RRR no M,R,G. Distal pulses +2.  No cyanosis.   RESP: CTA B/L, normal respiratory effort  GI: +BS, NTND Extremities: severe lymphedema in both legs, with erythema, warmth and weeping from the right leg (yellow/green), but improved today.  Swelling reduced, with now some folds in the skin in the left thigh. SKIN: lichenified skin in both lower legs, with skin crackes Neuro: II - XII grossly intact.  Sensation intact Psych: Normal mood and affect.  Appropriate judgement and reason  Photos taken on 10/22/19         Data Reviewed: I have personally reviewed following labs and imaging studies  CBC: Recent Labs  Lab 10/20/19 1409 10/20/19 1409 10/21/19 0541 10/22/19 0517 10/23/19 0528 10/24/19 0400 10/25/19 0411  WBC 5.8   < > 5.0 4.8 4.7 5.1 5.6  NEUTROABS 4.0  --   --   --   --   --   --   HGB 13.0   < > 10.7* 11.3* 11.3* 11.3* 11.7*  HCT 42.8   < >  34.8* 36.7 35.5* 36.6 36.7  MCV 83.8   < > 83.9 83.8 82.0 82.4 82.3  PLT 189   < > 165 166 188 195 196   < > = values in this interval not displayed.   Basic Metabolic Panel: Recent Labs  Lab 10/21/19 0541 10/22/19 0517 10/23/19 0528 10/24/19 0400 10/25/19 0411  NA 140 140 138 139 138  K 3.8 4.0 4.0 4.2 3.7  CL 106 104 98 97* 96*  CO2 28 29 30  32 33*  GLUCOSE 99 101* 95 97 97  BUN 13 12 13 14 16   CREATININE 0.81 0.80 0.89 0.91 1.02*  CALCIUM 7.9* 8.5* 8.2* 8.2* 8.4*  MG  --  1.9 1.9 1.8 2.0   GFR: Estimated Creatinine Clearance: 88.3 mL/min (A) (by C-G formula based on SCr of 1.02 mg/dL (H)). Liver Function Tests: Recent Labs  Lab 10/20/19 1409  AST 20  ALT 15  ALKPHOS 107  BILITOT 0.9  PROT  7.4  ALBUMIN 3.4*   No results for input(s): LIPASE, AMYLASE in the last 168 hours. No results for input(s): AMMONIA in the last 168 hours. Coagulation Profile: No results for input(s): INR, PROTIME in the last 168 hours. Cardiac Enzymes: No results for input(s): CKTOTAL, CKMB, CKMBINDEX, TROPONINI in the last 168 hours. BNP (last 3 results) No results for input(s): PROBNP in the last 8760 hours. HbA1C: No results for input(s): HGBA1C in the last 72 hours. CBG: No results for input(s): GLUCAP in the last 168 hours. Lipid Profile: No results for input(s): CHOL, HDL, LDLCALC, TRIG, CHOLHDL, LDLDIRECT in the last 72 hours. Thyroid Function Tests: No results for input(s): TSH, T4TOTAL, FREET4, T3FREE, THYROIDAB in the last 72 hours. Anemia Panel: No results for input(s): VITAMINB12, FOLATE, FERRITIN, TIBC, IRON, RETICCTPCT in the last 72 hours. Sepsis Labs: Recent Labs  Lab 10/20/19 1409  LATICACIDVEN 1.3    Recent Results (from the past 240 hour(s))  Blood culture (single)     Status: None   Collection Time: 10/20/19  6:13 PM   Specimen: BLOOD  Result Value Ref Range Status   Specimen Description BLOOD BLOOD RIGHT HAND  Final   Special Requests   Final    BOTTLES DRAWN AEROBIC AND ANAEROBIC Blood Culture adequate volume   Culture   Final    NO GROWTH 5 DAYS Performed at [facility], [address], [place], [place] [address]    Report Status 10/25/2019 FINAL  Final  SARS CORONAVIRUS 2 (TAT 6-24 HRS) Nasopharyngeal Nasopharyngeal Swab     Status: None   Collection Time: 10/20/19  6:13 PM   Specimen: Nasopharyngeal Swab  Result Value Ref Range Status   SARS Coronavirus 2 NEGATIVE NEGATIVE Final    Comment: (NOTE) SARS-CoV-2 target nucleic acids are NOT DETECTED. The SARS-CoV-2 RNA is generally detectable in upper and lower respiratory specimens during the acute phase of infection. Negative results do not preclude SARS-CoV-2 infection, do not rule  out co-infections with other pathogens, and should not be used as the sole basis for treatment or other patient management decisions. Negative results must be combined with clinical observations, patient history, and epidemiological information. The expected result is Negative. Fact Sheet for Patients: SugarRoll.be Fact Sheet for Healthcare Providers: https://www.woods-mathews.com/ This test is not yet approved or cleared by the [place] FDA and  has been authorized for detection and/or diagnosis of SARS-CoV-2 by FDA under an Emergency Use Authorization (EUA). This EUA will remain  in effect (meaning this test can be used)  for the duration of the COVID-19 declaration under Section 56 4(b)(1) of the Act, 21 U.S.C. section 360bbb-3(b)(1), unless the authorization is terminated or revoked sooner. Performed at [facility], [place] [address], [place], [place] [address]   Aerobic Culture (superficial specimen)     Status: None (Preliminary result)   Collection Time: 10/23/19  6:23 PM   Specimen: Wound  Result Value Ref Range Status   Specimen Description   Final    WOUND RIGHT LEG Performed at [facility], [place] [address], [place], [place] [address]    Special Requests   Final    NONE Performed at [facility], [place] [place], [place] [address]    Gram Stain   Final    NO WBC SEEN ABUNDANT GRAM NEGATIVE RODS FEW GRAM POSITIVE COCCI    Culture   Final    MODERATE PSEUDOMONAS AERUGINOSA SUSCEPTIBILITIES TO FOLLOW Performed at [facility], [place] [address], [place], [place] [address]    Report Status PENDING  Incomplete      Radiology Studies: No results found.   Scheduled Meds: . enoxaparin (LOVENOX) injection  40 mg Subcutaneous Q12H  . fluconazole  200 mg Oral Daily  . loratadine  10 mg Oral Daily   Continuous Infusions: . sodium chloride Stopped (10/25/19 0623)  .  piperacillin-tazobactam (ZOSYN)  IV 3.375 g (10/25/19 [id number])     LOS: 5 days     Enzo Bi, MD [facility] If 7PM-7AM, please contact night-coverage 10/25/2019, 3:08 PM

## 2019-10-25 NOTE — Progress Notes (Signed)
Cpap set at 16 cm based on pt's verbalization.

## 2019-10-25 NOTE — Progress Notes (Signed)
Physical Therapy Treatment Patient Details Name: [name] MRN: [id number] DOB: 1955/09/22 Today's Date: 10/25/2019    History of Present Illness 64 year old female with history of asthma, hypertension, obstructive sleep apnea, chronic lymphedema of the bilateral lower extremity who presented to the hospital with worsening right leg edema, pain and redness.    PT Comments    Patient agrees to PT treatment. She is MI for bed mobility with use of hospital bed for elevating head of bed. She is able to sit EOB without loss of balance. She needs assist to don socks. She performs sit to stand with quad cane after several attempts but then is able to perform sit to stand multiple times with MI and small base quad cane. She ambulated 75 feet with supervision and IV pole with quad cane. She is able to assist herself sit to supine with using a sheet to lift her legs and supervision. She is able to reposition herself in bed with assist to arrange the pads for drainage from LE's. She will not need any skilled PT after DC from hospital.   Follow Up Recommendations  No PT follow up     Equipment Recommendations  None recommended by PT    Recommendations for Other Services       Precautions / Restrictions Restrictions Weight Bearing Restrictions: No    Mobility  Bed Mobility Overal bed mobility: Needs Assistance Bed Mobility: Supine to Sit;Sit to Supine     Supine to sit: Supervision Sit to supine: Supervision;Min assist   General bed mobility comments: Pt uses sheet for LE assist  Transfers Overall transfer level: Modified independent Equipment used: Rolling walker (2 wheeled)             General transfer comment: no cues necessary  Ambulation/Gait Ambulation/Gait assistance: Supervision Gait Distance (Feet): 75 Feet Assistive device: Quad cane       General Gait Details: wide BOS due to large LE's   Stairs             Wheelchair Mobility    Modified  [place] (Stroke Patients Only)       Balance Overall balance assessment: Modified Independent                                          Cognition Arousal/Alertness: Awake/alert Behavior During Therapy: WFL for tasks assessed/performed Overall Cognitive Status: Within Functional Limits for tasks assessed                                        Exercises      General Comments        Pertinent Vitals/Pain Pain Assessment: No/denies pain    Home Living                      Prior Function            PT Goals (current goals can now be found in the care plan section) Acute Rehab PT Goals Patient Stated Goal: get swelling/weeping down in legs Time For Goal Achievement: 11/05/19 Potential to Achieve Goals: Good    Frequency    Min 2X/week      PT Plan Current plan remains appropriate    Co-evaluation  AM-PAC PT "6 Clicks" Mobility   Outcome Measure  Help needed turning from your back to your side while in a flat bed without using bedrails?: None Help needed moving from lying on your back to sitting on the side of a flat bed without using bedrails?: A Little Help needed moving to and from a bed to a chair (including a wheelchair)?: None Help needed standing up from a chair using your arms (e.g., wheelchair or bedside chair)?: None Help needed to walk in hospital room?: None Help needed climbing 3-5 steps with a railing? : None 6 Click Score: 23    End of Session Equipment Utilized During Treatment: Gait belt Activity Tolerance: Patient tolerated treatment well;Patient limited by fatigue Patient left: with bed alarm set;with call bell/phone within reach Nurse Communication: Mobility status PT Visit Diagnosis: Muscle weakness (generalized) (M62.81);Difficulty in walking, not elsewhere classified (R26.2)     Time: [id number] PT Time Calculation (min) (ACUTE ONLY): 38 min  Charges:  $Gait Training: 8-22  mins $Therapeutic Activity: 23-37 mins                        Alanson Puls, PT DPT 10/25/2019, 10:54 AM

## 2019-10-26 DIAGNOSIS — B965 Pseudomonas (aeruginosa) (mallei) (pseudomallei) as the cause of diseases classified elsewhere: Secondary | ICD-10-CM

## 2019-10-26 LAB — CBC
HCT: 37 % (ref 36.0–46.0)
Hemoglobin: 11.3 g/dL — ABNORMAL LOW (ref 12.0–15.0)
MCH: 25.7 pg — ABNORMAL LOW (ref 26.0–34.0)
MCHC: 30.5 g/dL (ref 30.0–36.0)
MCV: 84.1 fL (ref 80.0–100.0)
Platelets: 191 10*3/uL (ref 150–400)
RBC: 4.4 MIL/uL (ref 3.87–5.11)
RDW: 14.3 % (ref 11.5–15.5)
WBC: 5.2 10*3/uL (ref 4.0–10.5)
nRBC: 0 % (ref 0.0–0.2)

## 2019-10-26 LAB — BASIC METABOLIC PANEL
Anion gap: 8 (ref 5–15)
BUN: 18 mg/dL (ref 8–23)
CO2: 33 mmol/L — ABNORMAL HIGH (ref 22–32)
Calcium: 8.5 mg/dL — ABNORMAL LOW (ref 8.9–10.3)
Chloride: 100 mmol/L (ref 98–111)
Creatinine, Ser: 1 mg/dL (ref 0.44–1.00)
GFR calc Af Amer: >60 mL/min (ref >60)
GFR calc non Af Amer: 59 mL/min — ABNORMAL LOW (ref >60)
Glucose, Bld: 98 mg/dL (ref 70–99)
Potassium: 3.9 mmol/L (ref 3.5–5.1)
Sodium: 141 mmol/L (ref 135–145)

## 2019-10-26 LAB — MAGNESIUM: Magnesium: 1.9 mg/dL (ref 1.7–2.4)

## 2019-10-26 MED ORDER — FUROSEMIDE 10 MG/ML IJ SOLN
40.0000 mg | Freq: Three times a day (TID) | INTRAMUSCULAR | Status: DC
  Administered 2019-10-26 (×3): 40 mg via INTRAVENOUS
  Filled 2019-10-26 (×4): qty 4

## 2019-10-26 MED ORDER — NYSTATIN 100000 UNIT/GM EX POWD
Freq: Two times a day (BID) | CUTANEOUS | Status: DC
  Filled 2019-10-26: qty 15

## 2019-10-26 NOTE — TOC Progression Note (Signed)
Transition of Care [facility]) - Progression Note    Patient Details  Name: [name] MRN: [id number] Date of Birth: 1955/10/21  Transition of [facility]) CM/SW Contact  Su Hilt, RN Phone Number: 10/26/2019, 2:46 PM  Clinical Narrative:    Provided the [facility] Lymphedema clinic Phone number [phone] for the Physician to call with referral, Will obtain appointment afterwards    Expected Discharge Plan: Home/Self Care Barriers to Discharge: Continued Medical Work up  Expected Discharge Plan and Services Expected Discharge Plan: Home/Self Care       Living arrangements for the past 2 months: Single Family Home                           HH Arranged: NA           Social Determinants of Health (SDOH) Interventions    Readmission Risk Interventions No flowsheet data found.

## 2019-10-26 NOTE — Consult Note (Signed)
[place] Nurse Consult Note: Reason for Consult: requested to re-evaluate patient for weeping wound I saw this patient last week for same. Complicated situation for sure; she [place] transportation in the county so I can not recommend referral to larger lymphedema clinic.  She has been to the [facility] clinic previously and was turned away bc of drainage.  Will attempt to dry up areas with silver hydrofiber until DC.  Lymphedema wrapping is not a service offered in the acute care setting at [facility], must be performed by certified OT.    Wound type: weeping related to chronic severe lymphedema  Pressure Injury POA:NA Measurement:NA Drainage (amount, consistency, odor) serous  Periwound:large chronic lymphedema skin changes R>L Dressing procedure/placement/frequency: Add silver hydrofiber to attempt to dry up some of the weeping areas prior to DC.  Requested CM to make referral for outpatient tx.   MD aware.    Re consult if needed, will not follow at this time. Thanks  Aariel Ems [employer], RN,CWOCN, CNS, [place] [phone])

## 2019-10-26 NOTE — Progress Notes (Signed)
ID Pt doing better Discharge and swelling better  Patient Vitals for the past 24 hrs:  BP Temp Temp src Pulse Resp SpO2 Weight  10/26/19 1729 (!) 142/78 97.8 F (36.6 C) Oral 68 18 97 % --  10/26/19 0845 -- -- -- -- -- -- (!) 167.4 kg  10/26/19 0843 (!) 147/82 98.5 F (36.9 C) Oral 74 16 92 % --  10/25/19 2337 (!) 146/77 98.6 F (37 C) Oral 71 18 94 % --    O/E Legs 10/26/19 Erythema Swelling Discharge all reduce don the rt leg( bacK)   10/23/19     Impression Lymphedema legs rt worse than left with super added cellulitis and  pseudomonas infection On zosyn and leg improving Also with diuresis she has lost a lot of edema fluid Will not need IV antibiotic for home Continue Zosyn for another 48 hrs Silver dressing Discussed the management with the patient and her nurse

## 2019-10-26 NOTE — Progress Notes (Signed)
PROGRESS NOTE    [name]  [id number] DOB: [birth year] DOA: 10/20/2019 PCP: Ranae Plumber, PA    Assessment & Plan:   Active Problems:   Cellulitis of right leg   [name] is a 64 year old AA female with history of asthma, hypertension, obstructive sleep apnea, chronic lymphedema of the bilateral lower extremity who presented to the hospital with worsening right leg edema, pain and redness.    # Right leg cellulitis, improved # Chronic severe bilateral lymphedema  Patient was chronically treated with Keflex. --started on Unasyn and clindamycin on admission, and switched to Ancef and clinda, per ID. --IV lasix 40 BID from 4/15 --Net -25L charted, and about 40 lbs weight loss since presentation. PLAN: --continue IV lasix 40 mg BID while monitoring Cr  --continue zosyn to cover pseudomonas, per ID --Strict I/O and daily standing weight --Need to set up for outpatient lymphedema treatment  # Essential hypertension.   --continue home diuretic as IV lasix   # Morbid obesity.    # Obstructive sleep apnea.    # Asthma.  Stable.  No exacerbation.   DVT prophylaxis: Lovenox SQ Code Status: Full code  Family Communication:  Disposition Plan: Continue aggressive IV diuresis until Cr starts to increase and urine output decreases, to remove as much fluid as possible, given the extend of the extra fluids on board.  Also still on IV zosyn.  Discharge home, early next week.   Subjective and Interval History:  >4L urine output for the past day.  Net -25L charted, and about 40 lbs weight loss since presentation .  No fever, dyspnea, chest pain, abdominal pain, N/V/D, dysuria.  Swelling improved in her both legs.   Objective: Vitals:   10/25/19 1503 10/25/19 2337 10/26/19 0843 10/26/19 0845  BP: 114/68 (!) 146/77 (!) 147/82   Pulse: 76 71 74   Resp: 20 18 16    Temp: 98 F (36.7 C) 98.6 F (37 C) 98.5 F (36.9 C)   TempSrc: Oral Oral Oral   SpO2: 96%  94% 92%   Weight:    (!) 167.4 kg  Height:        Intake/Output Summary (Last 24 hours) at 10/26/2019 1632 Last data filed at 10/26/2019 1626 Gross per 24 hour  Intake 360 ml  Output 4300 ml  Net -3940 ml   Filed Weights   10/20/19 1407 10/25/19 1419 10/26/19 0845  Weight: (!) 185.1 kg (!) 168.9 kg (!) 167.4 kg    Examination:   Constitutional: NAD, AAOx3 HEENT: conjunctivae and lids normal, EOMI CV: RRR no M,R,G. Distal pulses +2.  No cyanosis.   RESP: CTA B/L, normal respiratory effort  GI: +BS, NTND Extremities: severe lymphedema in both legs, with erythema, warmth and weeping from the right leg (yellow/green), but further improved today.  Swelling reduced, with now some folds in the skin in the left thigh. SKIN: lichenified skin in both lower legs, with skin crackes Neuro: II - XII grossly intact.  Sensation intact Psych: Normal mood and affect.  Appropriate judgement and reason  Photos taken on 10/22/19         Data Reviewed: I have personally reviewed following labs and imaging studies  CBC: Recent Labs  Lab 10/20/19 1409 10/21/19 0541 10/22/19 0517 10/23/19 0528 10/24/19 0400 10/25/19 0411 10/26/19 0439  WBC 5.8   < > 4.8 4.7 5.1 5.6 5.2  NEUTROABS 4.0  --   --   --   --   --   --  HGB 13.0   < > 11.3* 11.3* 11.3* 11.7* 11.3*  HCT 42.8   < > 36.7 35.5* 36.6 36.7 37.0  MCV 83.8   < > 83.8 82.0 82.4 82.3 84.1  PLT 189   < > 166 188 195 196 191   < > = values in this interval not displayed.   Basic Metabolic Panel: Recent Labs  Lab 10/22/19 0517 10/22/19 0517 10/23/19 0528 10/24/19 0400 10/25/19 0411 10/25/19 1534 10/26/19 0439  NA 140   < > 138 139 138 138 141  K 4.0   < > 4.0 4.2 3.7 4.3 3.9  CL 104   < > 98 97* 96* 96* 100  CO2 29   < > 30 32 33* 34* 33*  GLUCOSE 101*   < > 95 97 97 105* 98  BUN 12   < > 13 14 16 18 18   CREATININE 0.80   < > 0.89 0.91 1.02* 1.02* 1.00  CALCIUM 8.5*   < > 8.2* 8.2* 8.4* 8.3* 8.5*  MG 1.9  --  1.9 1.8 2.0   --  1.9   < > = values in this interval not displayed.   GFR: Estimated Creatinine Clearance: 89.5 mL/min (by C-G formula based on SCr of 1 mg/dL). Liver Function Tests: Recent Labs  Lab 10/20/19 1409  AST 20  ALT 15  ALKPHOS 107  BILITOT 0.9  PROT 7.4  ALBUMIN 3.4*   No results for input(s): LIPASE, AMYLASE in the last 168 hours. No results for input(s): AMMONIA in the last 168 hours. Coagulation Profile: No results for input(s): INR, PROTIME in the last 168 hours. Cardiac Enzymes: No results for input(s): CKTOTAL, CKMB, CKMBINDEX, TROPONINI in the last 168 hours. BNP (last 3 results) No results for input(s): PROBNP in the last 8760 hours. HbA1C: No results for input(s): HGBA1C in the last 72 hours. CBG: No results for input(s): GLUCAP in the last 168 hours. Lipid Profile: No results for input(s): CHOL, HDL, LDLCALC, TRIG, CHOLHDL, LDLDIRECT in the last 72 hours. Thyroid Function Tests: No results for input(s): TSH, T4TOTAL, FREET4, T3FREE, THYROIDAB in the last 72 hours. Anemia Panel: No results for input(s): VITAMINB12, FOLATE, FERRITIN, TIBC, IRON, RETICCTPCT in the last 72 hours. Sepsis Labs: Recent Labs  Lab 10/20/19 1409  LATICACIDVEN 1.3    Recent Results (from the past 240 hour(s))  Blood culture (single)     Status: None   Collection Time: 10/20/19  6:13 PM   Specimen: BLOOD  Result Value Ref Range Status   Specimen Description BLOOD BLOOD RIGHT HAND  Final   Special Requests   Final    BOTTLES DRAWN AEROBIC AND ANAEROBIC Blood Culture adequate volume   Culture   Final    NO GROWTH 5 DAYS Performed at [facility], [address] [place], [place] [address]    Report Status 10/25/2019 FINAL  Final  SARS CORONAVIRUS 2 (TAT 6-24 HRS) Nasopharyngeal Nasopharyngeal Swab     Status: None   Collection Time: 10/20/19  6:13 PM   Specimen: Nasopharyngeal Swab  Result Value Ref Range Status   SARS Coronavirus 2 NEGATIVE NEGATIVE Final    Comment:  (NOTE) SARS-CoV-2 target nucleic acids are NOT DETECTED. The SARS-CoV-2 RNA is generally detectable in upper and lower respiratory specimens during the acute phase of infection. Negative results do not preclude SARS-CoV-2 infection, do not rule out co-infections with other pathogens, and should not be used as the sole basis for treatment or other patient management  decisions. Negative results must be combined with clinical observations, patient history, and epidemiological information. The expected result is Negative. Fact Sheet for Patients: SugarRoll.be Fact Sheet for Healthcare Providers: https://www.woods-mathews.com/ This test is not yet approved or cleared by the [place] FDA and  has been authorized for detection and/or diagnosis of SARS-CoV-2 by FDA under an Emergency Use Authorization (EUA). This EUA will remain  in effect (meaning this test can be used) for the duration of the COVID-19 declaration under Section 56 4(b)(1) of the Act, 21 U.S.C. section 360bbb-3(b)(1), unless the authorization is terminated or revoked sooner. Performed at [facility], [place] [address], [place], [place] [address]   Aerobic Culture (superficial specimen)     Status: None   Collection Time: 10/23/19  6:23 PM   Specimen: Wound  Result Value Ref Range Status   Specimen Description   Final    WOUND RIGHT LEG Performed at [facility], [place] [address], [place], [place] [address]    Special Requests   Final    NONE Performed at [facility], [address] [place], [place] [address]    Gram Stain   Final    NO WBC SEEN ABUNDANT GRAM NEGATIVE RODS FEW GRAM POSITIVE COCCI Performed at [facility], [place] [address], [place],  [address]    Culture   Final    MODERATE PSEUDOMONAS AERUGINOSA MODERATE CITROBACTER FREUNDII    Report Status 10/26/2019 FINAL  Final   Organism ID, Bacteria PSEUDOMONAS AERUGINOSA  Final    Organism ID, Bacteria CITROBACTER FREUNDII  Final      Susceptibility   Citrobacter freundii - MIC*    CEFAZOLIN >=64 RESISTANT Resistant     CEFEPIME <=0.12 SENSITIVE Sensitive     CEFTAZIDIME <=1 SENSITIVE Sensitive     CEFTRIAXONE 0.5 SENSITIVE Sensitive     CIPROFLOXACIN <=0.25 SENSITIVE Sensitive     GENTAMICIN <=1 SENSITIVE Sensitive     IMIPENEM 0.5 SENSITIVE Sensitive     TRIMETH/SULFA <=20 SENSITIVE Sensitive     PIP/TAZO <=4 SENSITIVE Sensitive     * MODERATE CITROBACTER FREUNDII   Pseudomonas aeruginosa - MIC*    CEFTAZIDIME 4 SENSITIVE Sensitive     CIPROFLOXACIN 2 INTERMEDIATE Intermediate     GENTAMICIN <=1 SENSITIVE Sensitive     IMIPENEM 2 SENSITIVE Sensitive     * MODERATE PSEUDOMONAS AERUGINOSA      Radiology Studies: No results found.   Scheduled Meds: . enoxaparin (LOVENOX) injection  40 mg Subcutaneous Q12H  . fluconazole  200 mg Oral Daily  . furosemide  40 mg Intravenous TID  . loratadine  10 mg Oral Daily   Continuous Infusions: . sodium chloride Stopped (10/25/19 0623)  . piperacillin-tazobactam (ZOSYN)  IV 3.375 g (10/26/19 1448)     LOS: 6 days     Enzo Bi, MD [facility] If 7PM-7AM, please contact night-coverage 10/26/2019, 4:32 PM

## 2019-10-27 LAB — BASIC METABOLIC PANEL
Anion gap: 11 (ref 5–15)
Anion gap: 10 (ref 5–15)
BUN: 21 mg/dL (ref 8–23)
BUN: 21 mg/dL (ref 8–23)
CO2: 32 mmol/L (ref 22–32)
CO2: 31 mmol/L (ref 22–32)
Calcium: 8.9 mg/dL (ref 8.9–10.3)
Calcium: 9.1 mg/dL (ref 8.9–10.3)
Chloride: 97 mmol/L — ABNORMAL LOW (ref 98–111)
Chloride: 97 mmol/L — ABNORMAL LOW (ref 98–111)
Creatinine, Ser: 1.17 mg/dL — ABNORMAL HIGH (ref 0.44–1.00)
Creatinine, Ser: 1.17 mg/dL — ABNORMAL HIGH (ref 0.44–1.00)
GFR calc Af Amer: 57 mL/min — ABNORMAL LOW (ref >60)
GFR calc Af Amer: 57 mL/min — ABNORMAL LOW (ref >60)
GFR calc non Af Amer: 49 mL/min — ABNORMAL LOW (ref >60)
GFR calc non Af Amer: 49 mL/min — ABNORMAL LOW (ref >60)
Glucose, Bld: 100 mg/dL — ABNORMAL HIGH (ref 70–99)
Glucose, Bld: 143 mg/dL — ABNORMAL HIGH (ref 70–99)
Potassium: 3.9 mmol/L (ref 3.5–5.1)
Potassium: 4.3 mmol/L (ref 3.5–5.1)
Sodium: 140 mmol/L (ref 135–145)
Sodium: 138 mmol/L (ref 135–145)

## 2019-10-27 LAB — CBC
HCT: 38.5 % (ref 36.0–46.0)
Hemoglobin: 12.2 g/dL (ref 12.0–15.0)
MCH: 25.9 pg — ABNORMAL LOW (ref 26.0–34.0)
MCHC: 31.7 g/dL (ref 30.0–36.0)
MCV: 81.7 fL (ref 80.0–100.0)
Platelets: 190 10*3/uL (ref 150–400)
RBC: 4.71 MIL/uL (ref 3.87–5.11)
RDW: 14 % (ref 11.5–15.5)
WBC: 5.3 10*3/uL (ref 4.0–10.5)
nRBC: 0 % (ref 0.0–0.2)

## 2019-10-27 LAB — MAGNESIUM: Magnesium: 2.3 mg/dL (ref 1.7–2.4)

## 2019-10-27 LAB — AEROBIC CULTURE W GRAM STAIN (SUPERFICIAL SPECIMEN): Gram Stain: NONE SEEN

## 2019-10-27 NOTE — Plan of Care (Signed)
  Problem: Health Behavior/Discharge Planning: Goal: Ability to manage health-related needs will improve Outcome: Progressing   Problem: Clinical Measurements: Goal: Respiratory complications will improve Outcome: Progressing Goal: Cardiovascular complication will be avoided Outcome: Progressing

## 2019-10-27 NOTE — Progress Notes (Signed)
ID  Lymphedema with purulent cellulitis with pseudomonas Never had fever or leucocytosis On Zosyn day 5- much improved Will not need any antibiotics on discharge Only local care with silver /dakins Discussed with patient

## 2019-10-27 NOTE — Progress Notes (Signed)
Writer provided dressing change right lower leg, cleansed NS pack dry, applied silver hydrofiber, ABD and kerlix wrapped and secured with tape. No drainage noted, skin red in skin folds of leg with dry flaky skin. Denies pain and able to raise her leg to help with dressing change.

## 2019-10-27 NOTE — Progress Notes (Signed)
Physical Therapy Treatment Patient Details Name: [name] MRN: [id number] DOB: [birth year] Today's Date: 10/27/2019    History of Present Illness 64 year old female with history of asthma, hypertension, obstructive sleep apnea, chronic lymphedema of the bilateral lower extremity who presented to the hospital with worsening right leg edema, pain and redness.    PT Comments    Pt was supine in bed upon arriving with RN in room. She agrees to PT session and is cooperative throughout. No report of pain at rest however did c/o minimal pain in R ankle during ambulation. She demonstrated safe ability to exit R side of bed without assistance. Stood to [employer] and quad cane without LOB. Pt ambulated 120 ft mostly with RW 2/2 to ankle pain but did also walk with her personal quad cane. She demonstrates safe ambulation and overall good balance. Discussed PT going forward. Therapist feels pt does not need continued PT at DC. She will continue to be followed by acute PT but will not require continued care after DC to home.      Follow Up Recommendations  No PT follow up     Equipment Recommendations  None recommended by PT    Recommendations for Other Services       Precautions / Restrictions Precautions Precautions: Fall Restrictions Weight Bearing Restrictions: No    Mobility  Bed Mobility Overal bed mobility: Needs Assistance Bed Mobility: Supine to Sit     Supine to sit: Supervision        Transfers Overall transfer level: Modified independent Equipment used: Quad cane;Rolling walker (2 wheeled)             General transfer comment: pt demonstrated safe ability to stand to RW/quad cane  Ambulation/Gait Ambulation/Gait assistance: Supervision Gait Distance (Feet): 120 Feet Assistive device: Quad cane;Rolling walker (2 wheeled) Gait Pattern/deviations: Step-through pattern Gait velocity: decreased   General Gait Details: Pt was able to ambulate with RW and quad  cane without LOB.    Stairs             Wheelchair Mobility    Modified Rankin (Stroke Patients Only)       Balance                                            Cognition Arousal/Alertness: Awake/alert Behavior During Therapy: WFL for tasks assessed/performed Overall Cognitive Status: Within Functional Limits for tasks assessed                                 General Comments: Pt is A and O x 4 and cooperative and motivated      Exercises      General Comments        Pertinent Vitals/Pain Pain Assessment: 0-10 Pain Score: 4  Pain Location: R ankle Pain Descriptors / Indicators: Aching Pain Intervention(s): Limited activity within patient's tolerance;Monitored during session    Home Living                      Prior Function            PT Goals (current goals can now be found in the care plan section) Acute Rehab PT Goals Patient Stated Goal: " I want to go home" Progress towards PT goals: Progressing toward goals    Frequency  Min 2X/week      PT Plan Current plan remains appropriate    Co-evaluation              AM-PAC PT "6 Clicks" Mobility   Outcome Measure  Help needed turning from your back to your side while in a flat bed without using bedrails?: None Help needed moving from lying on your back to sitting on the side of a flat bed without using bedrails?: A Little Help needed moving to and from a bed to a chair (including a wheelchair)?: None Help needed standing up from a chair using your arms (e.g., wheelchair or bedside chair)?: None Help needed to walk in hospital room?: None Help needed climbing 3-5 steps with a railing? : A Little 6 Click Score: 22    End of Session Equipment Utilized During Treatment: Gait belt Activity Tolerance: Patient tolerated treatment well Patient left: in chair;with call bell/phone within reach;with chair alarm set;with nursing/sitter in room Nurse  Communication: Mobility status PT Visit Diagnosis: Muscle weakness (generalized) (M62.81);Difficulty in walking, not elsewhere classified (R26.2)     Time: 1015-1040 PT Time Calculation (min) (ACUTE ONLY): 25 min  Charges:  $Gait Training: 8-22 mins $Therapeutic Activity: 8-22 mins                     Julaine Fusi PTA 10/27/19, 12:27 PM

## 2019-10-27 NOTE — Progress Notes (Signed)
PROGRESS NOTE    [name]  [id number] DOB: Dec 01, 1955 DOA: 10/20/2019 PCP: Ranae Plumber, PA    Assessment & Plan:   Active Problems:   Cellulitis of right leg   [name] is a 64 year old AA female with history of asthma, hypertension, obstructive sleep apnea, chronic lymphedema of the bilateral lower extremity who presented to the hospital with worsening right leg edema, pain and redness.    # Right leg cellulitis, improved # Chronic severe bilateral lymphedema  Patient was chronically treated with Keflex. --started on Unasyn and clindamycin on admission, and switched to Ancef and clinda, per ID. --IV lasix 40 BID from 4/15 --Net -27L charted, and about 44 lbs weight loss since presentation.  PLAN: --Hold diuretic today since Cr had a mild bump (1-->1.17). --If Cr improves, consider discharge on oral Lasix 40 mg BID with outpatient monitoring of kidney function with PCP --continue zosyn to cover pseudomonas until 4/21, per ID --Strict I/O and daily standing weight --Need to set up for outpatient lymphedema treatment  # Essential hypertension.   --Holding diuretic today  # Morbid obesity.    # Obstructive sleep apnea.    # Asthma.  Stable.  No exacerbation.   DVT prophylaxis: Lovenox SQ Code Status: Full code  Family Communication:  Disposition Plan: Discharge home, can be tomorrow, if ID discontinues IV zosyn tomorrow (the soonest date, per ID's note).   Subjective and Interval History:  >4L urine output for the past day.  Net -27L charted, and about 44 lbs weight loss since presentation .  No fever, dyspnea, chest pain, abdominal pain, N/V/D, dysuria.  Swelling improved in her both legs.  Draining improved in the right leg.   Objective: Vitals:   10/27/19 0020 10/27/19 0500 10/27/19 0822 10/27/19 1615  BP: (!) 152/76  (!) 147/81 (!) 145/84  Pulse: 66  61 67  Resp: 18  17   Temp: (!) 97.4 F (36.3 C)  98.2 F (36.8 C) 98.2 F  (36.8 C)  TempSrc: Oral  Oral Oral  SpO2: 98%  96% 99%  Weight:  (!) 165.4 kg    Height:        Intake/Output Summary (Last 24 hours) at 10/27/2019 1850 Last data filed at 10/27/2019 1015 Gross per 24 hour  Intake 240 ml  Output 2550 ml  Net -2310 ml   Filed Weights   10/25/19 1419 10/26/19 0845 10/27/19 0500  Weight: (!) 168.9 kg (!) 167.4 kg (!) 165.4 kg    Examination:   Constitutional: NAD, AAOx3 HEENT: conjunctivae and lids normal, EOMI CV: RRR no M,R,G. Distal pulses +2.  No cyanosis.   RESP: CTA B/L, normal respiratory effort  GI: +BS, NTND Extremities: severe lymphedema in both legs, with erythema, warmth and weeping from the right leg (yellow/green), but further improved today.  Swelling reduced, with now some folds in the skin in the left thigh. SKIN: lichenified skin in both lower legs, with skin crackes Neuro: II - XII grossly intact.  Sensation intact Psych: Normal mood and affect.  Appropriate judgement and reason  Photos taken on 10/22/19         Data Reviewed: I have personally reviewed following labs and imaging studies  CBC: Recent Labs  Lab 10/23/19 0528 10/24/19 0400 10/25/19 0411 10/26/19 0439 10/27/19 0443  WBC 4.7 5.1 5.6 5.2 5.3  HGB 11.3* 11.3* 11.7* 11.3* 12.2  HCT 35.5* 36.6 36.7 37.0 38.5  MCV 82.0 82.4 82.3 84.1 81.7  PLT 188 195 196 191  [id number]   Basic Metabolic Panel: Recent Labs  Lab 10/23/19 0528 10/23/19 0528 10/24/19 0400 10/24/19 0400 10/25/19 0411 10/25/19 1534 10/26/19 0439 10/27/19 0443 10/27/19 1336  NA 138   < > 139   < > 138 138 141 140 138  K 4.0   < > 4.2   < > 3.7 4.3 3.9 3.9 4.3  CL 98   < > 97*   < > 96* 96* 100 97* 97*  CO2 30   < > 32   < > 33* 34* 33* 32 31  GLUCOSE 95   < > 97   < > 97 105* 98 100* 143*  BUN 13   < > 14   < > 16 18 18 21 21   CREATININE 0.89   < > 0.91   < > 1.02* 1.02* 1.00 1.17* 1.17*  CALCIUM 8.2*   < > 8.2*   < > 8.4* 8.3* 8.5* 8.9 9.1  MG 1.9  --  1.8  --  2.0  --  1.9 2.3  --     < > = values in this interval not displayed.   GFR: Estimated Creatinine Clearance: 75.9 mL/min (A) (by C-G formula based on SCr of 1.17 mg/dL (H)). Liver Function Tests: No results for input(s): AST, ALT, ALKPHOS, BILITOT, PROT, ALBUMIN in the last 168 hours. No results for input(s): LIPASE, AMYLASE in the last 168 hours. No results for input(s): AMMONIA in the last 168 hours. Coagulation Profile: No results for input(s): INR, PROTIME in the last 168 hours. Cardiac Enzymes: No results for input(s): CKTOTAL, CKMB, CKMBINDEX, TROPONINI in the last 168 hours. BNP (last 3 results) No results for input(s): PROBNP in the last 8760 hours. HbA1C: No results for input(s): HGBA1C in the last 72 hours. CBG: No results for input(s): GLUCAP in the last 168 hours. Lipid Profile: No results for input(s): CHOL, HDL, LDLCALC, TRIG, CHOLHDL, LDLDIRECT in the last 72 hours. Thyroid Function Tests: No results for input(s): TSH, T4TOTAL, FREET4, T3FREE, THYROIDAB in the last 72 hours. Anemia Panel: No results for input(s): VITAMINB12, FOLATE, FERRITIN, TIBC, IRON, RETICCTPCT in the last 72 hours. Sepsis Labs: No results for input(s): PROCALCITON, LATICACIDVEN in the last 168 hours.  Recent Results (from the past 240 hour(s))  Blood culture (single)     Status: None   Collection Time: 10/20/19  6:13 PM   Specimen: BLOOD  Result Value Ref Range Status   Specimen Description BLOOD BLOOD RIGHT HAND  Final   Special Requests   Final    BOTTLES DRAWN AEROBIC AND ANAEROBIC Blood Culture adequate volume   Culture   Final    NO GROWTH 5 DAYS Performed at [facility], [address] [place],  [address]    Report Status 10/25/2019 FINAL  Final  SARS CORONAVIRUS 2 (TAT 6-24 HRS) Nasopharyngeal Nasopharyngeal Swab     Status: None   Collection Time: 10/20/19  6:13 PM   Specimen: Nasopharyngeal Swab  Result Value Ref Range Status   SARS Coronavirus 2 NEGATIVE NEGATIVE Final     Comment: (NOTE) SARS-CoV-2 target nucleic acids are NOT DETECTED. The SARS-CoV-2 RNA is generally detectable in upper and lower respiratory specimens during the acute phase of infection. Negative results do not preclude SARS-CoV-2 infection, do not rule out co-infections with other pathogens, and should not be used as the sole basis for treatment or other patient management decisions. Negative results must be combined with clinical observations, patient history, and epidemiological information. The expected  result is Negative. Fact Sheet for Patients: SugarRoll.be Fact Sheet for Healthcare Providers: https://www.woods-mathews.com/ This test is not yet approved or cleared by the [place] FDA and  has been authorized for detection and/or diagnosis of SARS-CoV-2 by FDA under an Emergency Use Authorization (EUA). This EUA will remain  in effect (meaning this test can be used) for the duration of the COVID-19 declaration under Section 56 4(b)(1) of the Act, 21 U.S.C. section 360bbb-3(b)(1), unless the authorization is terminated or revoked sooner. Performed at [facility], [place] [address], [place], [place] [address]   Aerobic Culture (superficial specimen)     Status: None   Collection Time: 10/23/19  6:23 PM   Specimen: Wound  Result Value Ref Range Status   Specimen Description   Final    WOUND RIGHT LEG Performed at [facility], [place] [address], [place], [place] [address]    Special Requests   Final    NONE Performed at [facility], [address] [place], [place] [address]    Gram Stain   Final    NO WBC SEEN ABUNDANT GRAM NEGATIVE RODS FEW GRAM POSITIVE COCCI Performed at [facility], [place] [address] [place], [place] [address]    Culture   Final    MODERATE PSEUDOMONAS AERUGINOSA MODERATE CITROBACTER FREUNDII    Report Status 10/27/2019 FINAL  Final   Organism ID, Bacteria PSEUDOMONAS  AERUGINOSA  Final   Organism ID, Bacteria CITROBACTER FREUNDII  Final      Susceptibility   Citrobacter freundii - MIC*    CEFAZOLIN >=64 RESISTANT Resistant     CEFEPIME <=0.12 SENSITIVE Sensitive     CEFTAZIDIME <=1 SENSITIVE Sensitive     CEFTRIAXONE 0.5 SENSITIVE Sensitive     CIPROFLOXACIN <=0.25 SENSITIVE Sensitive     GENTAMICIN <=1 SENSITIVE Sensitive     IMIPENEM 0.5 SENSITIVE Sensitive     TRIMETH/SULFA <=20 SENSITIVE Sensitive     PIP/TAZO <=4 SENSITIVE Sensitive     * MODERATE CITROBACTER FREUNDII   Pseudomonas aeruginosa - MIC*    CEFTAZIDIME 4 SENSITIVE Sensitive     CIPROFLOXACIN 2 INTERMEDIATE Intermediate     GENTAMICIN <=1 SENSITIVE Sensitive     IMIPENEM 2 SENSITIVE Sensitive     PIP/TAZO 32 SENSITIVE Sensitive     * MODERATE PSEUDOMONAS AERUGINOSA      Radiology Studies: No results found.   Scheduled Meds: . enoxaparin (LOVENOX) injection  40 mg Subcutaneous Q12H  . fluconazole  200 mg Oral Daily  . loratadine  10 mg Oral Daily  . nystatin   Topical BID   Continuous Infusions: . sodium chloride Stopped (10/25/19 0623)  . piperacillin-tazobactam (ZOSYN)  IV 3.375 g (10/27/19 1543)     LOS: 7 days     Enzo Bi, MD [facility] If 7PM-7AM, please contact night-coverage 10/27/2019, 6:50 PM

## 2019-10-28 LAB — BASIC METABOLIC PANEL
Anion gap: 8 (ref 5–15)
BUN: 24 mg/dL — ABNORMAL HIGH (ref 8–23)
CO2: 30 mmol/L (ref 22–32)
Calcium: 8.5 mg/dL — ABNORMAL LOW (ref 8.9–10.3)
Chloride: 101 mmol/L (ref 98–111)
Creatinine, Ser: 1.07 mg/dL — ABNORMAL HIGH (ref 0.44–1.00)
GFR calc Af Amer: >60 mL/min (ref >60)
GFR calc non Af Amer: 55 mL/min — ABNORMAL LOW (ref >60)
Glucose, Bld: 94 mg/dL (ref 70–99)
Potassium: 3.9 mmol/L (ref 3.5–5.1)
Sodium: 139 mmol/L (ref 135–145)

## 2019-10-28 LAB — CBC
HCT: 38.5 % (ref 36.0–46.0)
Hemoglobin: 11.9 g/dL — ABNORMAL LOW (ref 12.0–15.0)
MCH: 25.9 pg — ABNORMAL LOW (ref 26.0–34.0)
MCHC: 30.9 g/dL (ref 30.0–36.0)
MCV: 83.7 fL (ref 80.0–100.0)
Platelets: 189 10*3/uL (ref 150–400)
RBC: 4.6 MIL/uL (ref 3.87–5.11)
RDW: 14.3 % (ref 11.5–15.5)
WBC: 5.3 10*3/uL (ref 4.0–10.5)
nRBC: 0 % (ref 0.0–0.2)

## 2019-10-28 LAB — MAGNESIUM: Magnesium: 2.3 mg/dL (ref 1.7–2.4)

## 2019-10-28 NOTE — Plan of Care (Signed)
?  Problem: Education: ?Goal: Knowledge of General Education information will improve ?Description: Including pain rating scale, medication(s)/side effects and non-pharmacologic comfort measures ?Outcome: Progressing ?  ?Problem: Health Behavior/Discharge Planning: ?Goal: Ability to manage health-related needs will improve ?Outcome: Progressing ?  ?Problem: Clinical Measurements: ?Goal: Diagnostic test results will improve ?Outcome: Progressing ?  ?Problem: Activity: ?Goal: Risk for activity intolerance will decrease ?Outcome: Progressing ?  ?

## 2019-10-28 NOTE — TOC Progression Note (Signed)
Transition of Care [facility]) - Progression Note    Patient Details  Name: [name] MRN: [id number] Date of Birth: 10/27/1955  Transition of Care [facility]) CM/SW Contact  Su Hilt, RN Phone Number: 10/28/2019, 3:45 PM  Clinical Narrative:    Faxed referral to [facility] clinic, Patient will get a call with an appointment   Expected Discharge Plan: Home/Self Care Barriers to Discharge: Continued Medical Work up  Expected Discharge Plan and Services Expected Discharge Plan: Home/Self Care       Living arrangements for the past 2 months: Single Family Home Expected Discharge Date: 10/28/19                         [facility] Arranged: NA           Social Determinants of Health (SDOH) Interventions    Readmission Risk Interventions No flowsheet data found.

## 2019-10-28 NOTE — Progress Notes (Signed)
Pharmacy Antibiotic Note  [name] is a 64 y.o. female admitted on 10/20/2019 with wound infection.  Pharmacy has been consulted for Zosyn dosing.  Plan: Zosyn 3.375g IV q8h (4 hour infusion).  (possibly to be dc'ed today per ID)  Height: 5\' 4"  (162.6 cm) Weight: (!) 165.4 kg (364 lb 10.3 oz) IBW/kg (Calculated) : 54.7  Temp (24hrs), Avg:98.4 F (36.9 C), Min:98.2 F (36.8 C), Max:98.5 F (36.9 C)  Recent Labs  Lab 10/24/19 0400 10/24/19 0400 10/25/19 0411 10/25/19 0411 10/25/19 1534 10/26/19 0439 10/27/19 0443 10/27/19 1336 10/28/19 0548  WBC 5.1  --  5.6  --   --  5.2 5.3  --  5.3  CREATININE 0.91   < > 1.02*   < > 1.02* 1.00 1.17* 1.17* 1.07*   < > = values in this interval not displayed.    Estimated Creatinine Clearance: 83 mL/min (A) (by C-G formula based on SCr of 1.07 mg/dL (H)).    Allergies  Allergen Reactions  . Vancomycin Itching and Nausea And Vomiting  . Ace Inhibitors Hives and Swelling  . Ibuprofen Hives and Swelling    Antimicrobials this admission: Unasyn 4/13 >> 4/14  Cefazolin 4/13 >> 4/16  Zosyn 4/16 >>   Dose adjustments this admission: None  Microbiology results:  BCx: NG 5 days  WCx:  Pseudomonas/Citrobacter  Sputum:    MRSA PCR:   Thank you for allowing pharmacy to be a part of this patient's care.  Lu Duffel, PharmD, BCPS Clinical Pharmacist 10/28/2019 10:22 AM

## 2019-10-28 NOTE — Discharge Summary (Signed)
Physician Discharge Summary  Patient ID: [name] MRN: [id number] DOB/AGE: March 14, 1956 64 y.o.  Admit date: 10/20/2019 Discharge date: 10/28/2019  Admission Diagnoses: Right leg cellulitis Chronic bilateral lymphedema Essential hypertension Morbid obesity Obstructive sleep apnea Asthma Discharge Diagnoses:  Active Problems:   Cellulitis of right leg Severe chronic bilateral lymphedema Essential hypertension Morbid obesity Obstructive sleep apnea  Discharged Condition: good  Hospital Course:  [name] is a 64 year old AA female with history of asthma, hypertension, obstructive sleep apnea, chronic lymphedema of the bilateral lower extremity who presented to the hospital with worsening right leg edema, pain and redness. Consult from infect disease was obtained, patient was treated with Ancef and clindamycin.  So far she has completed at least a 5 days course, she was also started on Zosyn to cover Pseudomonas.  She has completed the course today. Upon my examination today, she still has chronic lymphedema, however the leg redness has essentially resolved.  At this point she is medically stable to be discharged.  Patient instructions,: Follow-up with family doctor in 1 week, also refer to wound care as outpatient.    Consults: ID  Significant Diagnostic Studies: None  Treatments: Antibiotics  Discharge Exam: Blood pressure 126/72, pulse 71, temperature 98.4 F (36.9 C), temperature source Oral, resp. rate 16, height 5\' 4"  (1.626 m), weight (!) 165.4 kg, SpO2 96 %.  General.  Patient appears to be alert, oriented to time place and person, no distress. Pulmonary: Lungs are clear no crackles or wheezes CV: Normal rate and rhythm no murmurs. Abdomen: Soft, no distention or tenderness.  No hepatomegaly or splenomegaly. Ext: Bilateral severe lymphedema in lower extremities.  No redness or tenderness.  Disposition: Discharge disposition: 01-Home or Self  Care       Discharge Instructions    Diet - low sodium heart healthy   Complete by: As directed    Increase activity slowly   Complete by: As directed      Allergies as of 10/28/2019      Reactions   Vancomycin Itching, Nausea And Vomiting   Ace Inhibitors Hives, Swelling   Ibuprofen Hives, Swelling      Medication List    STOP taking these medications   cephALEXin 500 MG capsule Commonly known as: KEFLEX     TAKE these medications   acetaminophen 500 MG tablet Commonly known as: TYLENOL Take 500-1,000 mg by mouth every 6 (six) hours as needed for mild pain or fever.   acidophilus Caps capsule Take 1 capsule by mouth daily.   atenolol 25 MG tablet Commonly known as: TENORMIN Take 25 mg by mouth daily.   cetirizine 10 MG tablet Commonly known as: ZYRTEC Take 10 mg by mouth daily as needed for allergies.   furosemide 20 MG tablet Commonly known as: LASIX Take 1 tablet (20 mg total) by mouth daily.      Follow-up Information    Ranae Plumber, [place] Follow up in 1 week(s).   Specialty: Family Medicine Contact information: [place] [address] [phone]          28 minutes Signed: Sharen Hones 10/28/2019, 3:21 PM

## 2019-10-28 NOTE — Progress Notes (Signed)
Writer called EMS for pick up spoke with Danielle. Patient is stable and ready for discharge home. Writer went over discharge paperwork with patient and she verbalized understanding. Patient's belongings packed and with patient. Patient's IV removed. Patient is transporting via EMS to her home unable to fix in car.

## 2019-11-27 ENCOUNTER — Encounter: Payer: Medicaid Other | Attending: Internal Medicine | Admitting: Internal Medicine

## 2019-11-27 ENCOUNTER — Other Ambulatory Visit: Payer: Self-pay

## 2019-11-27 DIAGNOSIS — G4733 Obstructive sleep apnea (adult) (pediatric): Secondary | ICD-10-CM | POA: Insufficient documentation

## 2019-11-27 DIAGNOSIS — Z87891 Personal history of nicotine dependence: Secondary | ICD-10-CM | POA: Diagnosis not present

## 2019-11-27 DIAGNOSIS — Z886 Allergy status to analgesic agent status: Secondary | ICD-10-CM | POA: Insufficient documentation

## 2019-11-27 DIAGNOSIS — I10 Essential (primary) hypertension: Secondary | ICD-10-CM | POA: Diagnosis not present

## 2019-11-27 DIAGNOSIS — Z888 Allergy status to other drugs, medicaments and biological substances status: Secondary | ICD-10-CM | POA: Diagnosis not present

## 2019-11-27 DIAGNOSIS — Q820 Hereditary lymphedema: Secondary | ICD-10-CM | POA: Diagnosis not present

## 2019-11-27 DIAGNOSIS — J45909 Unspecified asthma, uncomplicated: Secondary | ICD-10-CM | POA: Diagnosis not present

## 2019-11-27 DIAGNOSIS — M1990 Unspecified osteoarthritis, unspecified site: Secondary | ICD-10-CM | POA: Diagnosis not present

## 2019-11-27 DIAGNOSIS — I890 Lymphedema, not elsewhere classified: Secondary | ICD-10-CM | POA: Diagnosis present

## 2019-11-27 DIAGNOSIS — L97811 Non-pressure chronic ulcer of other part of right lower leg limited to breakdown of skin: Secondary | ICD-10-CM | POA: Diagnosis not present

## 2019-11-27 NOTE — Progress Notes (Signed)
[name], [name] ([id number]) Visit Report for 11/27/2019 HPI Details Patient Name: [name], [name]. Date of Service: 11/27/2019 9:45 AM Medical Record Number: [id number] Patient Account Number: [id number] Date of Birth/Sex: 1956/05/28 (64 y.o. F) Treating RN: Montey Hora Primary Care Provider: Tandy Gaw Other Clinician: Referring Provider: Tandy Gaw Treating Provider/Extender: Ricard Dillon Weeks in Treatment: 0 History of Present Illness HPI Description: The patient is a 64 year old female with history of hypertension and a long-standing history of bilateral lower extremity lymphedema (first presented on 4/2) . She has had open ulcers in the past which have always responded to compression therapy. She had briefly been to a lymphedema clinic in the past which helped her at the time. this time around she stopped treatment of her lymphedema pumps approximately 2 weeks ago because of some pain in the knees and then noticed the right leg getting worse. She was seen by her PCP who put her on clindamycin 4 times a day 2 days ago. The patient has seen [facility] and Dr. Delana Meyer had seen her last year where a vascular study including venous and arterial duplex studies were within normal limits. he had recommended compression stockings and lymphedema pumps and the patient has been using this in about 2 weeks ago. She is known to be diabetic but in the past few time she's gone to her primary care doctor her hemoglobin A1c has been normal. 02/11/2015 - after her last visit she took my advice and went to the ER regarding the progressive cellulitis of her right lower extremity and she was admitted between July 17 and 22nd. She received IV antibiotics and then was sent home on a course of steroid-induced and oral antibiotics. She has improved much since then. 02/17/2015 -- she has been doing fine and the weeping of her legs has remarkably gone down. She has no fresh  issues. READMISSION 01/15/18 This patient was given this clinic before most recently in 2016 seen by Dr. Con Memos. She has massive bilateral lymphedema and over the last 2 months this had weeping edema out of the left leg. She has compression pumps but her compliance with these has been minimal. She has [facility] they've been using TCA/ABDs/kerlix under an Ace wrap.she has had recent problems with cellulitis. She was apparently seen in the ER and 12/23/17 and given clindamycin. She was then followed by her primary doctor and given doxycycline and Keflex. The pain seems to have settled down. In April 2018 the patient had arterial studies done at [facility]. This showed triphasic waveforms throughout the right leg and mostly triphasic waveforms on the left except for monophasic at the posterior tibial artery distally. She was not felt to have evidence of right lower extremity arterial stenosis or significant problems on the left side. 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 She also had a right lower extremity venous Doppler in January 2018 this was limited by the patient's body habitus and lymphedema. Most of the proximal veins were not visualized The patient presents with an area of denuded skin on the anterior medial part of the left calf. There is weeping edema fluid here. 01/22/18; the patient has somewhat better edema control using her compression pumps twice a day and as a result she has much better epithelialization on the left anterior calf area. Only a small open area remains. 01/29/18; the patient has been compliant with her compression pumps. Both the areas on her calf that healed. The remaining area on  the left anterior leg is fully epithelialized Readmission: 02/20/2019 upon evaluation today patient presents for reevaluation due to issues that she is having with the bilateral lower extremities. She actually has wounds open on both legs. On  the right she has an area in the crease of her leg on the right around the knee region which is actually draining quite a bit and actually has some fungal type appearance to it. She has been on nystatin powder that seems to have helped to some degree. In regard to the left lower extremity this is actually in the lower portion of her leg closer to the ankle and again is continuing to drain as well unfortunately. There does not appear to be any signs of active infection at this time which is good news. No fevers, chills, nausea, vomiting, or diarrhea. She tells me that since she was seen last year she is actually been doing quite well for the most part with regard to her lower extremities. Unfortunately she now is experiencing a little bit more drainage at this time. She is concerned about getting this under control so that it does not get significantly worse. 02/27/2019 on evaluation today patient appears to be doing somewhat better in regard to her bilateral lower extremity wounds. She has been tolerating the dressing changes without complication. Fortunately there is no signs of active infection at this point. No fevers, chills, nausea, vomiting, or diarrhea. She did get her dressing supplies which is excellent news she was extremely excited to get these. She also got paperwork from [employer] for their financial assistance program where they may be able to help her out in the future if needed with supplies at discounted prices. 03/06/2019 on evaluation today patient appears to be doing a little worse with regard to both areas of weeping on her bilateral lower extremities. This is around the right medial knee and just above the left ankle. With that being said she is unfortunately not doing as well as I would like to see. I feel like she may need to potentially go see someone at the lymphedema clinic as the wraps that she needs or even beyond what we can do here at the wound care center. She really does not  have wounds she just has open areas of weeping that are causing some difficulty for her. Subsequently because of this and the moisture I am concerned about the potential for infection I am going to likely give her a prophylactic antibiotic today, Keflex, just to be on the safe side. Nonetheless again there is no obvious signs of active infection at this time. [name], [name] ([id number]) 03/13/2019 on evaluation today patient appears to be doing well with regard to her bilateral lower extremities where she has been weeping compared to even last week's evaluation. I see some areas of new skin growth which is excellent and overall I am very pleased with how things seem to be progressing. No fevers, chills, nausea, vomiting, or diarrhea. 03/20/2019 on evaluation today patient unfortunately is continuing to have issues with significant edema of the left lower extremity. Her right side seems to be doing much better. Unfortunately her left side is showing increased weeping of the lower portion of her leg. This is quite unfortunate obviously we were hoping to get her into the lymphedema clinic they really do not seem to when I see her how if she is draining. Despite the fact this is really not wound related but more lymphedema weeping related. Nonetheless I do  not know that this can be helpful for her to even go for that appointment since again I am not sure there is much that they would actually do at this point. We may need to try a 4 layer compression wrap as best we can on her leg. She is on the Augmentin currently although I am still concerned about whether or not there could be potentially something going on infection wise I would obtain a culture though I understand is not the best being that is a surface culture I just 1 to make sure I do not seem to be missing anything. 03/27/2019 on evaluation today patient appears to be doing much better in regard to the left lower extremity compared to last week.  Last week she had tremendous weeping which I think was subsequent to infection now she seems to be doing much better and very pleased. This is not completely healed but there is a lot of new skin growth and it has dried out quite a bit. Overall I think that we are doing well with how things are moving along at this time. No fevers, chills, nausea, vomiting, or diarrhea. 04/03/2019 evaluation today patient appears to be doing a little worse this week compared to last time I saw her. I think this may be due to the fact that she is having issues with not being able to sleep in her bed at least not until last night. She is therefore been in a lift chair and subsequently has also had issues with not been able to use her pumps since she could not get in bed. With that being said the patient overall seems to be doing okay I do think I may want extend the antibiotic for a little bit longer at least until we can see if her edema and her weeping gets better and if it is then obviously I can always discontinue the antibiotics as of next week however I want her to continue to have it over the next week. 04/10/2019 on evaluation today patient unfortunately is still doing poorly with regard to her left lower extremity. Her right is all things considering doing fairly well. On the left however she continues to have spreading of the area of infection and weeping which appears to be even a larger surface area than noted last week. She did have a positive culture for Pseudomonas in particular which seems to have been of concern she still has green/yellow discharge consistent with Pseudomonas and subsequently a tremendous amount of it. This has me obviously still concerned about the infection not really clearing up despite the fact that on culture it appears the Cipro should have been a good option for treating this. I think she may at this point need IV antibiotics since things are not doing better I do not want to get  worse and cause sepsis. She is in agreement with the plan and believes as well that she likely does need to go to the hospital for IV vancomycin. Or something of the like depending on what the recommendation is from the ER. 04/17/2019 on evaluation today patient appears to be doing excellent in regard to her lower extremity on the left. She was in the hospital for several days from when I sent her last we saw her until just this past Tuesday. Fortunately her drainage is significantly improved and in fact is mostly clear. There is just a couple small areas that may still drain a little bit she states that the  Omnicef they prescribed for her at discharge she went picked up from pharmacy and got home but has not been able to find it since. She is looked everywhere. She is wondering if I will replace that for her today I will be more than happy to do that. 05/01/2019 on evaluation today patient actually appears to be doing quite well with regard to her lower extremities. She occasionally is having areas that will leak and then heal up mainly when a piece of the fibrotic skin pops off but fortunately she is not having any signs of active infection at this time. Overall she also really does not have any obvious weeping at this time. I do believe however she really needs some compression wraps and I think this may be a good time to get her back to the lymphedema clinic. 05/11/2019 on evaluation today patient actually appears to be doing quite well with regard to her bilateral lower extremities. She occasionally will have a small area that we per another but in general seems to be completely healed which is great news. Overall very pleased with how everything seems to be progressing. She does have her appointment with lymphedema clinic on November 18. 05/25/2019 on evaluation today patient appears to be doing well with regard to her left lower extremity. I am very pleased in this regard. In regard to her right  leg this actually did start draining more I think it is mainly due to the fact that her leg is more swollen. I am not seeing any obvious signs of infection at this time although that is definitely something were obviously acutely aware of simply due to the fact that she had an issue not too far back with exactly this issue. Nonetheless I do feel like that lymphedema clinic would still be beneficial for her. I explained obviously if they are not able to do anything treatment wise on the right leg we could at least have them treat her left leg and then proceed from there. The patient is really in agreement with that plan. If they are able to do both as the drainage slows down that I would be happy to let them handle both. 06/01/2019 on evaluation today patient unfortunately appears to be doing worse with regard to her right lower extremity. The left lower extremity is still maintaining at this point. Unfortunately she has been having significantly increased pain over the past several days and has been experiencing as well increased swelling of the right lower extremity. I really do not know that I am seeing anything that appears to be obvious for infection at this point to be peripherally honest. With that being said the patient does seem to be having much more swelling that she is even experienced in the past and coupled with increased pain in her hip as well I am concerned that again she could potentially have a DVT although I am not 100% sure of this. I think it something that may need to be checked out. We discussed the possibility of sending her for a DVT study through the hospital but unfortunately transportation is an issue if she does have a DVT I do not want her to wait days to be able to get in for that test however if she has this scheduled as an outpatient that is as fast that she will be able to get the test scheduled for transportation purposes. That will also fall on Thanksgiving so  subsequently she did actually be looking at either  Friday or even next week before we would know anything back from this. That is much too long in my opinion. Subsequent to the amount of discomfort she is experiencing the patient is actually okay with going to the ER for evaluation today. 06/12/2019 on evaluation today patient actually appears to be doing significantly better compared to last time I saw her. Following when I last saw her she was actually in the hospital from that Monday until the following Sunday almost 1 full week. She actually was placed on Keflex in the hospital following the time for her to be discharged and Dr. Steva Ready has recommended 2 times a day dosing of the Keflex for the next year in order to help with more prophylactic/preventative measures with regard to her developing cellulitis. Overall I think this sounds like an excellent plan. The patient unfortunately is good to have trouble being treated at lymphedema clinic due to the fact that she really cannot get up on the bed that they have there. They also state that they cannot manage her as long as she has anything draining at this point. Obviously that is somewhat unfortunate as she does need help with edema control but nonetheless we will have to do what we can for her outside of it sounds like the lymphedema clinic scenario at this point. 06/19/2019 on evaluation today patient appears to be doing fairly well with regard to her bilateral lower extremities. She is not nearly as swollen and shows no signs of infection at this point. There is no evidence of cellulitis whatsoever. She also has no open wounds or draining at this point [name], [name]. ([id number]) which is also good news. No fever chills noted. She seems to be in very good spirits and in fact appears to be doing quite well. READMISSION 11/27/2019 This is a 64 year old woman that we have had in this clinic several times before including 2015, 16 and 19 and  then most recently from 03/20/2019 through 06/19/2019 with bilateral lower extremity lymphedema. She has had previous arterial and reflux studies done years ago which were not all that remarkable. 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 She does have a positive family history and she had large legs starting may be in her 23s. She was recently in hospital from 10/20/2019 through 10/28/2019 with right leg cellulitis. She was given Ancef and clindamycin and then Zosyn when a culture showed Pseudomonas. At that time there was purulent drainage. She was followed by infectious disease Dr Steva Ready. The patient is now back at home. She has noted increased swelling in the right and no drainage in her right leg mostly on the posterior medial aspect in the calf area. She has not had pain or fever. She has literally been improved lysing above dressings because her at the area of this is far too large for standard compression. She has been wrapping the areas with sheets to resorptive pads. She is found these helped somewhat. She does have an appointment with the lymphedema clinic in [place] in late June. Past medical history includes bilateral lymphedema, hypertension, obstructive sleep apnea with CPAP. Recent hospitalization with apparently Pseudomonas cellulitis of the right lower leg Electronic Signature(s) Signed: 11/27/2019 12:54:43 PM By: Linton Ham MD Entered By: Linton Ham on 11/27/2019 11:16:20 Padula, [name] ([id number]) -------------------------------------------------------------------------------- Physical Exam Details Patient Name: JOEI, RATHMANN C. Date of Service: 11/27/2019 9:45 AM Medical Record Number: [id number] Patient Account Number: [id number] Date of Birth/Sex: Jan 14, 1956 (  64 y.o. F) Treating RN: Montey Hora Primary Care Provider: Tandy Gaw Other Clinician: Referring Provider: Tandy Gaw Treating  Provider/Extender: Ricard Dillon Weeks in Treatment: 0 Constitutional Sitting or standing Blood Pressure is within target range for patient.. Pulse regular and within target range for patient.Marland Kitchen Respirations regular, non- labored and within target range.. Temperature is normal and within the target range for the patient.Marland Kitchen appears in no distress. Patient does not look to be systemically unwell. Respiratory Respiratory effort is easy and symmetric bilaterally. Rate is normal at rest and on room air.. Bilateral breath sounds are clear and equal in all lobes with no wheezes, rales or rhonchi.. Cardiovascular Heart rhythm and rate regular, without murmur or gallop.. Not possible to get pulses in her feet however her foot is warm. Severe bilateral right greater than left lymphedema. Notes Wound exam; there is no open wound per se however the patient has a large area of her posterior medial calf on the right with weeping edema fluid and loss of surface epithelium. There is erythema but no tenderness. I do not believe this is infected Electronic Signature(s) Signed: 11/27/2019 12:54:43 PM By: Linton Ham MD Entered By: Linton Ham on 11/27/2019 Olivet, [name] ([id number]) -------------------------------------------------------------------------------- Physician Orders Details Patient Name: [name]. Date of Service: 11/27/2019 9:45 AM Medical Record Number: [id number] Patient Account Number: [id number] Date of Birth/Sex: 29-Jun-1956 (64 y.o. F) Treating RN: Montey Hora Primary Care Provider: Tandy Gaw Other Clinician: Referring Provider: Tandy Gaw Treating Provider/Extender: Tito Dine in Treatment: 0 Verbal / Phone Orders: No Diagnosis Coding Secondary Dressing o ABD and Kerlix/Conform Dressing Change Frequency o Change dressing every day. - and as needed Follow-up Appointments o Return Appointment in 2 weeks. Edema Control o  Elevate legs to the level of the heart and pump ankles as often as possible o Compression Pump: Use compression pump on left lower extremity for 60 minutes, twice daily. o Compression Pump: Use compression pump on right lower extremity for 60 minutes, twice daily. o Other: - ACE wraps Electronic Signature(s) Signed: 11/27/2019 12:54:43 PM By: Linton Ham MD Signed: 11/27/2019 1:36:47 PM By: Montey Hora Entered By: Montey Hora on 11/27/2019 10:58:57 Richardson, [name] ([id number]) -------------------------------------------------------------------------------- Problem List Details Patient Name: KAYTLYNNE, BASIL. Date of Service: 11/27/2019 9:45 AM Medical Record Number: [id number] Patient Account Number: [id number] Date of Birth/Sex: 12/27/1955 (64 y.o. 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 Date of Service: 11/27/2019 9:45 AM Medical Record Number: [id number] Patient Account Number: [id number] Date of Birth/Sex: 1955-08-02 (64 y.o. F) Treating RN: Montey Hora Primary Care Provider: Tandy Gaw Other Clinician: Referring Provider: Tandy Gaw Treating Provider/Extender: Ricard Dillon Weeks in Treatment: 0 Subjective History of Present Illness (HPI) The  patient is a 64 year old female with history of hypertension and a long-standing history of bilateral lower extremity lymphedema (first presented on 4/2) . She has had open ulcers in the past which have always responded to compression therapy. She had briefly been to a lymphedema  clinic in the past which helped her at the time. this time around she stopped treatment of her lymphedema pumps approximately 2 weeks ago because of some pain in the knees and then noticed the right leg getting worse. She was seen by her PCP who put her on clindamycin 4 times a day 2 days ago. The patient has seen [facility] and Dr. Delana Meyer had seen her last year where a vascular study including venous and arterial duplex studies were within normal limits. he had recommended compression stockings and lymphedema pumps and the patient has been using this in about 2 weeks ago. She is known to be diabetic but in the past few time she's gone to her primary care doctor her hemoglobin A1c has been normal. 02/11/2015 - after her last visit she took my advice and went to the ER regarding the progressive cellulitis of her right lower extremity and she was admitted between July 17 and 22nd. She received IV antibiotics and then was sent home on a course of steroid-induced and oral antibiotics. She has improved much since then. 02/17/2015 -- she has been doing fine and the weeping of her legs has remarkably gone down. She has no fresh issues. READMISSION 01/15/18 This patient was given this clinic before most recently in 2016 seen by Dr. Con Memos. She has massive bilateral lymphedema and over the last 2 months this had weeping edema out of the left leg. She has compression pumps but her compliance with these has been minimal. She has [facility] they've been using TCA/ABDs/kerlix under an Ace wrap.she has had recent problems with cellulitis. She was apparently seen in the ER and 12/23/17 and given clindamycin. She was then followed by her  primary doctor and given doxycycline and Keflex. The pain seems to have settled down. In April 2018 the patient had arterial studies done at [facility]. This showed triphasic waveforms throughout the right leg and mostly triphasic waveforms on the left except for monophasic at the posterior tibial artery distally. She was not felt to have evidence of right lower extremity arterial stenosis or significant problems on the left side. 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 She also had a right lower extremity venous Doppler in January 2018 this was limited by the patient's body habitus and lymphedema. Most of the proximal veins were not visualized The patient presents with an area of denuded skin on the anterior medial part of the left calf. There is weeping edema fluid here. 01/22/18; the patient has somewhat better edema control using her compression pumps twice a day and as a result she has much better epithelialization on the left anterior calf area. Only a small open area remains. 01/29/18; the patient has been compliant with her compression pumps. Both the areas on her calf that healed. The remaining area on the left anterior leg is fully epithelialized Readmission: 02/20/2019 upon evaluation today patient presents for reevaluation due to issues that she is having with the bilateral lower extremities. She actually has wounds open on both legs. On the right she has an area in the crease of her leg on the right around the knee region which is actually draining quite a bit and actually has some fungal type appearance to it. She has been on nystatin powder that seems to have helped to some degree. In regard to the left lower extremity this is actually in the lower portion of her leg closer to the ankle and again is continuing to  drain as well unfortunately. There does not appear to be any signs of active infection at this time which is good news. No fevers,  chills, nausea, vomiting, or diarrhea. She tells me that since she was seen last year she is actually been doing quite well for the most part with regard to her lower extremities. Unfortunately she now is experiencing a little bit more drainage at this time. She is concerned about getting this under control so that it does not get significantly worse. 02/27/2019 on evaluation today patient appears to be doing somewhat better in regard to her bilateral lower extremity wounds. She has been tolerating the dressing changes without complication. Fortunately there is no signs of active infection at this point. No fevers, chills, nausea, vomiting, or diarrhea. She did get her dressing supplies which is excellent news she was extremely excited to get these. She also got paperwork from [employer] for their financial assistance program where they may be able to help her out in the future if needed with supplies at discounted prices. 03/06/2019 on evaluation today patient appears to be doing a little worse with regard to both areas of weeping on her bilateral lower extremities. This is around the right medial knee and just above the left ankle. With that being said she is unfortunately not doing as well as I would like to see. I feel like she may need to potentially go see someone at the lymphedema clinic as the wraps that she needs or even beyond what we can do here at the wound care center. She really does not have wounds she just has open areas of weeping that are causing some difficulty for her. Subsequently because of this and the moisture I am concerned about the potential for infection I am going to likely give her a prophylactic antibiotic today, Keflex, just to be on the safe side. Nonetheless again there is no obvious signs of active infection at this time. 03/13/2019 on evaluation today patient appears to be doing well with regard to her bilateral lower extremities where she has been weeping compared to even  last week's evaluation. I see some areas of new skin growth which is excellent and overall I am very pleased with how things seem to be progressing. No fevers, chills, nausea, vomiting, or diarrhea. [name], [name] ([id number]) 03/20/2019 on evaluation today patient unfortunately is continuing to have issues with significant edema of the left lower extremity. Her right side seems to be doing much better. Unfortunately her left side is showing increased weeping of the lower portion of her leg. This is quite unfortunate obviously we were hoping to get her into the lymphedema clinic they really do not seem to when I see her how if she is draining. Despite the fact this is really not wound related but more lymphedema weeping related. Nonetheless I do not know that this can be helpful for her to even go for that appointment since again I am not sure there is much that they would actually do at this point. We may need to try a 4 layer compression wrap as best we can on her leg. She is on the Augmentin currently although I am still concerned about whether or not there could be potentially something going on infection wise I would obtain a culture though I understand is not the best being that is a surface culture I just 1 to make sure I do not seem to be missing anything. 03/27/2019 on evaluation today patient appears to  be doing much better in regard to the left lower extremity compared to last week. Last week she had tremendous weeping which I think was subsequent to infection now she seems to be doing much better and very pleased. This is not completely healed but there is a lot of new skin growth and it has dried out quite a bit. Overall I think that we are doing well with how things are moving along at this time. No fevers, chills, nausea, vomiting, or diarrhea. 04/03/2019 evaluation today patient appears to be doing a little worse this week compared to last time I saw her. I think this may be due to  the fact that she is having issues with not being able to sleep in her bed at least not until last night. She is therefore been in a lift chair and subsequently has also had issues with not been able to use her pumps since she could not get in bed. With that being said the patient overall seems to be doing okay I do think I may want extend the antibiotic for a little bit longer at least until we can see if her edema and her weeping gets better and if it is then obviously I can always discontinue the antibiotics as of next week however I want her to continue to have it over the next week. 04/10/2019 on evaluation today patient unfortunately is still doing poorly with regard to her left lower extremity. Her right is all things considering doing fairly well. On the left however she continues to have spreading of the area of infection and weeping which appears to be even a larger surface area than noted last week. She did have a positive culture for Pseudomonas in particular which seems to have been of concern she still has green/yellow discharge consistent with Pseudomonas and subsequently a tremendous amount of it. This has me obviously still concerned about the infection not really clearing up despite the fact that on culture it appears the Cipro should have been a good option for treating this. I think she may at this point need IV antibiotics since things are not doing better I do not want to get worse and cause sepsis. She is in agreement with the plan and believes as well that she likely does need to go to the hospital for IV vancomycin. Or something of the like depending on what the recommendation is from the ER. 04/17/2019 on evaluation today patient appears to be doing excellent in regard to her lower extremity on the left. She was in the hospital for several days from when I sent her last we saw her until just this past Tuesday. Fortunately her drainage is significantly improved and in fact  is mostly clear. There is just a couple small areas that may still drain a little bit she states that the [facility] they prescribed for her at discharge she went picked up from pharmacy and got home but has not been able to find it since. She is looked everywhere. She is wondering if I will replace that for her today I will be more than happy to do that. 05/01/2019 on evaluation today patient actually appears to be doing quite well with regard to her lower extremities. She occasionally is having areas that will leak and then heal up mainly when a piece of the fibrotic skin pops off but fortunately she is not having any signs of active infection at this time. Overall she also really does not have any obvious  weeping at this time. I do believe however she really needs some compression wraps and I think this may be a good time to get her back to the lymphedema clinic. 05/11/2019 on evaluation today patient actually appears to be doing quite well with regard to her bilateral lower extremities. She occasionally will have a small area that we per another but in general seems to be completely healed which is great news. Overall very pleased with how everything seems to be progressing. She does have her appointment with lymphedema clinic on November 18. 05/25/2019 on evaluation today patient appears to be doing well with regard to her left lower extremity. I am very pleased in this regard. In regard to her right leg this actually did start draining more I think it is mainly due to the fact that her leg is more swollen. I am not seeing any obvious signs of infection at this time although that is definitely something were obviously acutely aware of simply due to the fact that she had an issue not too far back with exactly this issue. Nonetheless I do feel like that lymphedema clinic would still be beneficial for her. I explained obviously if they are not able to do anything treatment wise on the right leg we could  at least have them treat her left leg and then proceed from there. The patient is really in agreement with that plan. If they are able to do both as the drainage slows down that I would be happy to let them handle both. 06/01/2019 on evaluation today patient unfortunately appears to be doing worse with regard to her right lower extremity. The left lower extremity is still maintaining at this point. Unfortunately she has been having significantly increased pain over the past several days and has been experiencing as well increased swelling of the right lower extremity. I really do not know that I am seeing anything that appears to be obvious for infection at this point to be peripherally honest. With that being said the patient does seem to be having much more swelling that she is even experienced in the past and coupled with increased pain in her hip as well I am concerned that again she could potentially have a DVT although I am not 100% sure of this. I think it something that may need to be checked out. We discussed the possibility of sending her for a DVT study through the hospital but unfortunately transportation is an issue if she does have a DVT I do not want her to wait days to be able to get in for that test however if she has this scheduled as an outpatient that is as fast that she will be able to get the test scheduled for transportation purposes. That will also fall on Thanksgiving so subsequently she did actually be looking at either Friday or even next week before we would know anything back from this. That is much too long in my opinion. Subsequent to the amount of discomfort she is experiencing the patient is actually okay with going to the ER for evaluation today. 06/12/2019 on evaluation today patient actually appears to be doing significantly better compared to last time I saw her. Following when I last saw her she was actually in the hospital from that Monday until the following  Sunday almost 1 full week. She actually was placed on Keflex in the hospital following the time for her to be discharged and Dr. Steva Ready has recommended 2 times a day dosing  of the Keflex for the next year in order to help with more prophylactic/preventative measures with regard to her developing cellulitis. Overall I think this sounds like an excellent plan. The patient unfortunately is good to have trouble being treated at lymphedema clinic due to the fact that she really cannot get up on the bed that they have there. They also state that they cannot manage her as long as she has anything draining at this point. Obviously that is somewhat unfortunate as she does need help with edema control but nonetheless we will have to do what we can for her outside of it sounds like the lymphedema clinic scenario at this point. 06/19/2019 on evaluation today patient appears to be doing fairly well with regard to her bilateral lower extremities. She is not nearly as swollen and shows no signs of infection at this point. There is no evidence of cellulitis whatsoever. She also has no open wounds or draining at this point which is also good news. No fever chills noted. She seems to be in very good spirits and in fact appears to be doing quite well. READMISSION 11/27/2019 [name], [name] ([id number]) This is a 64 year old woman that we have had in this clinic several times before including 2015, 16 and 19 and then most recently from 03/20/2019 through 06/19/2019 with bilateral lower extremity lymphedema. She has had previous arterial and reflux studies done years ago which were not all that remarkable. 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 She does have a positive family history and she had large legs starting may be in her 92s. She was recently in hospital from 10/20/2019 through 10/28/2019 with right leg cellulitis. She was given Ancef and clindamycin and  then Zosyn when a culture showed Pseudomonas. At that time there was purulent drainage. She was followed by infectious disease Dr Steva Ready. The patient is now back at home. She has noted increased swelling in the right and no drainage in her right leg mostly on the posterior medial aspect in the calf area. She has not had pain or fever. She has literally been improved lysing above dressings because her at the area of this is far too large for standard compression. She has been wrapping the areas with sheets to resorptive pads. She is found these helped somewhat. She does have an appointment with the lymphedema clinic in [place] in late June. Past medical history includes bilateral lymphedema, hypertension, obstructive sleep apnea with CPAP. Recent hospitalization with apparently Pseudomonas cellulitis of the right lower leg Patient History Information obtained from Patient. Allergies ibuprofen (Reaction: hives, swells), ACE Inhibitors Family History Cancer - Father, Diabetes - Father, Heart Disease - Mother, Hypertension - Mother,Father, Seizures - Mother, Stroke - Mother, Thyroid Problems - Mother,Siblings, No family history of Hereditary Spherocytosis, Kidney Disease, Lung Disease, Tuberculosis. Social History Former smoker - uses snuff, Marital Status - Single, Alcohol Use - Rarely, Drug Use - No History, Caffeine Use - Daily. 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 Denies history of Aspiration, Chronic Obstructive Pulmonary Disease (COPD),  Pneumothorax Cardiovascular Patient has history of Hypertension Denies history of Angina, Arrhythmia, Congestive Heart Failure, Coronary Artery Disease,  Deep Vein Thrombosis, Hypotension, Myocardial Infarction, Peripheral Arterial Disease, Peripheral Venous Disease, Phlebitis, Vasculitis Gastrointestinal Denies history of Cirrhosis , Colitis, Crohn s, Hepatitis A, Hepatitis B, Hepatitis C Endocrine Denies history of Type I Diabetes, Type II Diabetes Genitourinary Denies history of End Stage Renal Disease Immunological Denies history of Lupus Erythematosus, Raynaud s, Scleroderma Integumentary (Skin) Denies history of History of Burn, History of pressure wounds Musculoskeletal Patient has history of Gout, Osteoarthritis Denies history of Rheumatoid Arthritis, Osteomyelitis Neurologic Denies history of Dementia, Neuropathy, Quadriplegia, Paraplegia, Seizure Disorder Oncologic Denies history of Received Chemotherapy, Received Radiation Psychiatric Denies history of Anorexia/bulimia, Confinement Anxiety Hospitalization/Surgery History - [facility]. Review of Systems (ROS) Constitutional Symptoms (General Health) Denies complaints or symptoms of Fatigue, Fever, Chills, Marked Weight Change. Eyes Denies complaints or symptoms of Dry Eyes, Vision Changes, Glasses / Contacts. Ear/Nose/Mouth/Throat Denies complaints or symptoms of Difficult clearing ears, Sinusitis. Hematologic/Lymphatic Denies complaints or symptoms of Bleeding / Clotting Disorders, Human Immunodeficiency Virus. Respiratory [place], [place] ([id number]) Denies complaints or symptoms of Chronic or frequent coughs, Shortness of Breath. Cardiovascular Denies complaints or symptoms of Chest pain, LE edema. Gastrointestinal Denies complaints or symptoms of Frequent diarrhea, Nausea, Vomiting. Endocrine Denies complaints or symptoms of Hepatitis, Thyroid disease, Polydypsia (Excessive Thirst). Genitourinary Denies complaints  or symptoms of Kidney failure/ Dialysis, Incontinence/dribbling. Immunological Denies complaints or symptoms of Hives, Itching. Integumentary (Skin) Denies complaints or symptoms of Wounds, Bleeding or bruising tendency, Breakdown, Swelling. Musculoskeletal Denies complaints or symptoms of Muscle Pain, Muscle Weakness. Neurologic Denies complaints or symptoms of Numbness/parasthesias, Focal/Weakness. Psychiatric Denies complaints or symptoms of Anxiety, Claustrophobia. Objective Constitutional Sitting or standing Blood Pressure is within target range for patient.. Pulse regular and within target range for patient.Marland Kitchen Respirations regular, non- labored and within target range.. Temperature is normal and within the target range for the patient.Marland Kitchen appears in no distress. Patient does not look to be systemically unwell. Vitals Time Taken: 10:10 AM, Temperature: 98.2 F, Pulse: 71 bpm, Respiratory Rate: 16 breaths/min, Blood Pressure: 118/82 mmHg. Respiratory Respiratory effort is easy and symmetric bilaterally. Rate is normal at rest and on room air.. Bilateral breath sounds are clear and equal in all lobes with no wheezes, rales or rhonchi.. Cardiovascular Heart rhythm and rate regular, without murmur or gallop.. Not possible to get pulses in her feet however her foot is warm. Severe bilateral right greater than left lymphedema. General Notes: Wound exam; there is no open wound per se however the patient has a large area of her posterior medial calf on the right with weeping edema fluid and loss of surface epithelium. There is erythema but no tenderness. 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 - and as needed Follow-up Appointments: Return  Appointment in 2 weeks. Edema Control: [name], [name]. ([id number]) Elevate legs to the level of the heart and pump ankles as often as possible Compression Pump: Use compression pump on left lower extremity for 60 minutes, twice daily. Compression Pump: Use compression pump on right lower extremity for 60 minutes, twice daily. Other: - ACE wraps 1. Massive lymphedema of the right lower leg. She has a large pantaloon deformity of tissue over her heel area into her foot. It is hard even to identify her knee area. 2 large area of the epithelialization massive lymphedema. 3. The patient has compression pumps  which apparently she received from vascular surgery some years ago. She has not used them in the last several months anyways. 4 she is going to need some form of absorptive product either ABDs, perhaps feminine hygiene products or anything else that can be improvised to except fluid over a large area and put this under an Ace wrap. I do not think there is anything else from the standard product line that we have enough to cover this degree of surface area. 5. I emphasized to the patient that this will require her compression pumps at least twice a day maybe 3 times a day for an hour. She expressed understanding. We will see next visit whether she is able to comply with any aspect of this. Her leg is large enough that I be concerned about the ability to get the sleeve on 6. She has a appointment with the lymphedema clinic at [facility] I believe on 6/28. She was encouraged to go to this appointment. Electronic Signature(s) Signed: 11/27/2019 12:54:43 PM By: Linton Ham MD Entered By: Linton Ham on 11/27/2019 11:21:26 [name] ([id number]) -------------------------------------------------------------------------------- ROS/PFSH Details Patient Name: [name]. Date of Service: 11/27/2019 9:45 AM Medical Record Number: [id number] Patient Account Number: [id number] Date of  Birth/Sex: 13-Feb-1956 (64 y.o. F) Treating RN: Army Melia Primary Care Provider: Tandy Gaw Other Clinician: Referring Provider: Tandy Gaw Treating Provider/Extender: Ricard Dillon Weeks in Treatment: 0 Information Obtained From Patient Constitutional Symptoms (General Health) Complaints and Symptoms: Negative for: Fatigue; Fever; Chills; Marked Weight Change Eyes Complaints and Symptoms: Negative for: Dry Eyes; Vision Changes; Glasses / Contacts Medical History: Positive for: Cataracts Negative for: Glaucoma; Optic Neuritis Ear/Nose/Mouth/Throat Complaints and Symptoms: Negative for: Difficult clearing ears; Sinusitis Medical History: Negative for: Chronic sinus problems/congestion; Middle ear problems Hematologic/Lymphatic Complaints and Symptoms: Negative for: Bleeding / Clotting Disorders; Human Immunodeficiency Virus Medical History: Positive for: Lymphedema Negative for: Anemia; Hemophilia; Human Immunodeficiency Virus; Sickle Cell Disease Respiratory Complaints and Symptoms: Negative for: Chronic or frequent coughs; Shortness of Breath Medical History: Positive for: Asthma - controlled; Sleep Apnea - C-pap; 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 Negative for: Aspiration; Chronic Obstructive Pulmonary Disease (COPD); Pneumothorax Cardiovascular Complaints and Symptoms: Negative for: Chest pain; LE edema Medical History: Positive for: Hypertension Negative for: Angina; Arrhythmia; Congestive Heart Failure; Coronary Artery Disease; Deep Vein Thrombosis; Hypotension; Myocardial Infarction; Peripheral Arterial Disease; Peripheral Venous Disease; Phlebitis; Vasculitis Gastrointestinal Complaints and Symptoms: Negative for: Frequent diarrhea; Nausea; Vomiting Medical History: Negative for: Cirrhosis ; Colitis; Crohnos; Hepatitis A; Hepatitis B; Hepatitis C Selders, Chiquitta C. ([id number]) Endocrine Complaints and Symptoms: Negative for: Hepatitis; Thyroid  disease; Polydypsia (Excessive Thirst) Medical History: Negative for: Type I Diabetes; Type II Diabetes Genitourinary Complaints and Symptoms: Negative for: Kidney failure/ Dialysis; Incontinence/dribbling Medical History: Negative for: End Stage Renal Disease Immunological Complaints and Symptoms: Negative for: Hives; Itching Medical History: Negative for: Lupus Erythematosus; Raynaudos; Scleroderma Integumentary (Skin) Complaints and Symptoms: Negative for: Wounds; Bleeding or bruising tendency; Breakdown; Swelling Medical History: Negative for: History of Burn; History of pressure wounds Musculoskeletal Complaints and Symptoms: Negative for: Muscle Pain; Muscle Weakness Medical History: Positive for: Gout; Osteoarthritis Negative for: Rheumatoid Arthritis; Osteomyelitis Neurologic Complaints and Symptoms: Negative for: Numbness/parasthesias; Focal/Weakness Medical History: Negative for: Dementia; Neuropathy; Quadriplegia; Paraplegia; Seizure Disorder Psychiatric Complaints and Symptoms: Negative for: Anxiety; Claustrophobia Medical History: Negative for: Anorexia/bulimia; Confinement Anxiety Oncologic Medical History: Negative for: Received Chemotherapy; Received Radiation HBO Extended History Items Eyes: Cataracts Immunizations Pneumococcal Vaccine: Received Pneumococcal Vaccination: Yes [name], [name] (  [id number]) Implantable Devices None Hospitalization / Surgery History Type of Hospitalization/Surgery [facility] Family and Social History Cancer: Yes - Father; Diabetes: Yes - Father; Heart Disease: Yes - Mother; Hereditary Spherocytosis: No; Hypertension: Yes - Mother,Father; Kidney Disease: No; Lung Disease: No; Seizures: Yes - Mother; Stroke: Yes - Mother; Thyroid Problems: Yes - Mother,Siblings; Tuberculosis: No; Former smoker - uses snuff; Marital Status - Single; Alcohol Use: Rarely; Drug Use: No History; Caffeine Use: Daily; Financial Concerns: No; Food,  Clothing or Shelter Needs: No; Support System Lacking: No; Transportation Concerns: No Electronic Signature(s) Signed: 11/27/2019 11:08:48 AM By: Army Melia Signed: 11/27/2019 12:54:43 PM By: Linton Ham MD Entered By: Army Melia on 11/27/2019 10:19:11 [name] ([id number]) -------------------------------------------------------------------------------- SuperBill Details Patient Name: [name]. Date of Service: 11/27/2019 Medical Record Number: [id number] Patient Account Number: [id number] Date of Birth/Sex: 1956-05-21 (64 y.o. 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

## 2019-11-27 NOTE — Progress Notes (Signed)
[name], [name] ([id number]) Visit Report for 11/27/2019 Allergy List Details Patient Name: [name], [name]. Date of Service: 11/27/2019 9:45 AM Medical Record Number: [id number] Patient Account Number: [id number] Date of Birth/Sex: 30-Jul-1955 (64 y.o. F) Treating RN: Army Melia Primary Care Shady Padron: Tandy Gaw Other Clinician: Referring Gurbani Figge: Tandy Gaw Treating Johnrobert Foti/Extender: Ricard Dillon Weeks in Treatment: 0 Allergies Active Allergies ibuprofen Reaction: hives, swells ACE Inhibitors Allergy Notes Electronic Signature(s) Signed: 11/27/2019 11:08:48 AM By: Army Melia Entered By: Army Melia on 11/27/2019 10:18:04 [name] ([id number]) -------------------------------------------------------------------------------- Arrival Information Details Patient Name: [name]. Date of Service: 11/27/2019 9:45 AM Medical Record Number: [id number] Patient Account Number: [id number] Date of Birth/Sex: 1956-04-04 (64 y.o. F) Treating RN: Army Melia Primary Care Tymon Nemetz: Tandy Gaw Other Clinician: Referring Johnryan Sao: Tandy Gaw Treating Guadalupe Kerekes/Extender: Tito Dine in Treatment: 0 Visit Information Patient Arrived: Cane Arrival Time: 10:10 Accompanied By: self Transfer Assistance: None History Since Last Visit Added or deleted any medications: No Any new allergies or adverse reactions: No Had a fall or experienced change in activities of daily living that may affect risk of falls: No Signs or symptoms of abuse/neglect since last visito No Hospitalized since last visit: No Has Dressing in Place as Prescribed: Yes Electronic Signature(s) Signed: 11/27/2019 11:08:48 AM By: Army Melia Entered By: Army Melia on 11/27/2019 10:10:45 [name] ([id number]) -------------------------------------------------------------------------------- Clinic Level of Care Assessment Details Patient Name: [name]. Date of  Service: 11/27/2019 9:45 AM Medical Record Number: [id number] Patient Account Number: [id number] Date of Birth/Sex: 05/31/1956 (64 y.o. F) Treating RN: Montey Hora Primary Care Klein Willcox: Tandy Gaw Other Clinician: Referring Elita Dame: Tandy Gaw Treating Donoven Pett/Extender: Tito Dine in Treatment: 0 Clinic Level of Care Assessment Items TOOL 2 Quantity Score []  - Use when only an EandM is performed on the INITIAL visit 0 ASSESSMENTS - Nursing Assessment / Reassessment X - General Physical Exam (combine w/ comprehensive assessment (listed just below) when performed on new 1 20 pt. evals) X- 1 25 Comprehensive Assessment (HX, ROS, Risk Assessments, Wounds Hx, etc.) ASSESSMENTS - Wound and Skin Assessment / Reassessment []  - Simple Wound Assessment / Reassessment - one wound 0 []  - 0 Complex Wound Assessment / Reassessment - multiple wounds X- 1 10 Dermatologic / Skin Assessment (not related to wound area) ASSESSMENTS - Ostomy and/or Continence Assessment and Care []  - Incontinence Assessment and Management 0 []  - 0 Ostomy Care Assessment and Management (repouching, etc.) PROCESS - Coordination of Care X - Simple Patient / Family Education for ongoing care 1 15 []  - 0 Complex (extensive) Patient / Family Education for ongoing care X- 1 10 Staff obtains Programmer, systems, Records, Test Results / Process Orders []  - 0 Staff telephones HHA, Nursing Homes / Clarify orders / etc []  - 0 Routine Transfer to another Facility (non-emergent condition) []  - 0 Routine Hospital Admission (non-emergent condition) X- 1 15 New Admissions / Biomedical engineer / Ordering NPWT, Apligraf, etc. []  - 0 Emergency Hospital Admission (emergent condition) X- 1 10 Simple Discharge Coordination []  - 0 Complex (extensive) Discharge Coordination PROCESS - Special Needs []  - Pediatric / Minor Patient Management 0 []  - 0 Isolation Patient Management []  - 0 Hearing / Language / Visual  special needs []  - 0 Assessment of Community assistance (transportation, D/C planning, etc.) []  - 0 Additional assistance / Altered mentation []  - 0 Support Surface(s) Assessment (bed, cushion, seat, etc.) INTERVENTIONS - Wound Cleansing / Measurement []  - Wound Imaging (photographs - any  number of wounds) 0 []  - 0 Wound Tracing (instead of photographs) []  - 0 Simple Wound Measurement - one wound []  - 0 Complex Wound Measurement - multiple wounds Froemming, Shamecka C. ([id number]) []  - 0 Simple Wound Cleansing - one wound []  - 0 Complex Wound Cleansing - multiple wounds INTERVENTIONS - Wound Dressings []  - Small Wound Dressing one or multiple wounds 0 []  - 0 Medium Wound Dressing one or multiple wounds X- 1 20 Large Wound Dressing one or multiple wounds []  - 0 Application of Medications - injection INTERVENTIONS - Miscellaneous []  - External ear exam 0 []  - 0 Specimen Collection (cultures, biopsies, blood, body fluids, etc.) []  - 0 Specimen(s) / Culture(s) sent or taken to Lab for analysis []  - 0 Patient Transfer (multiple staff / Civil Service fast streamer / Similar devices) []  - 0 Simple Staple / Suture removal (25 or less) []  - 0 Complex Staple / Suture removal (26 or more) []  - 0 Hypo / Hyperglycemic Management (close monitor of Blood Glucose) []  - 0 Ankle / Brachial Index (ABI) - do not check if billed separately Has the patient been seen at the hospital within the last three years: Yes Total Score: 125 Level Of Care: New/Established - Level 4 Electronic Signature(s) Signed: 11/27/2019 1:36:47 PM By: Montey Hora Entered By: Montey Hora on 11/27/2019 10:55:44 Gipe, [name] ([id number]) -------------------------------------------------------------------------------- Encounter Discharge Information Details Patient Name: [name]. Date of Service: 11/27/2019 9:45 AM Medical Record Number: [id number] Patient Account Number: [id number] Date of Birth/Sex: Nov 12, 1955 (64  y.o. F) Treating RN: Montey Hora Primary Care Markavious Micco: Tandy Gaw Other Clinician: Referring Alyviah Crandle: Tandy Gaw Treating Aiya Keach/Extender: Tito Dine in Treatment: 0 Encounter Discharge Information Items Discharge Condition: Stable Ambulatory Status: Cane Discharge Destination: Home Transportation: Private Auto Accompanied By: self Schedule Follow-up Appointment: Yes Clinical Summary of Care: Electronic Signature(s) Signed: 11/27/2019 1:36:47 PM By: Montey Hora Entered By: Montey Hora on 11/27/2019 10:56:56 Villari, [name] ([id number]) -------------------------------------------------------------------------------- Lower Extremity Assessment Details Patient Name: [name] Date of Service: 11/27/2019 9:45 AM Medical Record Number: [id number] Patient Account Number: [id number] Date of Birth/Sex: 05-01-1956 (64 y.o. F) Treating RN: Army Melia Primary Care Telford Archambeau: Tandy Gaw Other Clinician: Referring Preslee Regas: Tandy Gaw Treating Michaela Broski/Extender: Ricard Dillon Weeks in Treatment: 0 Edema Assessment Assessed: [Left: No] [Right: No] Edema: [Left: Yes] [Right: Yes] Vascular Assessment Pulses: Dorsalis Pedis Palpable: [Right:Yes] Notes unable to obtain abis d/t size of leg Electronic Signature(s) Signed: 11/27/2019 11:08:48 AM By: Army Melia Entered By: Army Melia on 11/27/2019 10:17:58 Calvario, [name] ([id number]) -------------------------------------------------------------------------------- Multi Wound Chart Details Patient Name: [name]. Date of Service: 11/27/2019 9:45 AM Medical Record Number: [id number] Patient Account Number: [id number] Date of Birth/Sex: 1956-04-18 (64 y.o. F) Treating RN: Montey Hora Primary Care Bland Rudzinski: Tandy Gaw Other Clinician: Referring Bright Spielmann: Tandy Gaw Treating Shakyia Bosso/Extender: Ricard Dillon Weeks in Treatment: 0 Vital Signs Height(in): Pulse(bpm):  71 Weight(lbs): Blood Pressure(mmHg): 118/82 Body Mass Index(BMI): Temperature(F): 98.2 Respiratory Rate(breaths/min): 16 Wound Assessments Treatment Notes Electronic Signature(s) Signed: 11/27/2019 12:54:43 PM By: Linton Ham MD Entered By: Linton Ham on 11/27/2019 11:13:05 Easom, [name] ([id number]) -------------------------------------------------------------------------------- [place] Details Patient Name: [name]. Date of Service: 11/27/2019 9:45 AM Medical Record Number: [id number] Patient Account Number: [id number] Date of Birth/Sex: October 20, 1955 (64 y.o. F) Treating RN: Montey Hora Primary Care Fahima Cifelli: Tandy Gaw Other Clinician: Referring Savanah Bayles: Tandy Gaw Treating Ociel Retherford/Extender: Tito Dine in Treatment: 0 Active Inactive Abuse / Safety / Falls /  Self Care Management Nursing Diagnoses: Potential for falls Goals: Patient will remain injury free related to falls Date Initiated: 11/27/2019 Target Resolution Date: 02/13/2020 Goal Status: Active Interventions: Assess fall risk on admission and as needed Notes: Orientation to the Wound Care Program Nursing Diagnoses: Knowledge deficit related to the wound healing center program Goals: Patient/caregiver will verbalize understanding of the [facility] Date Initiated: 11/27/2019 Target Resolution Date: 02/13/2020 Goal Status: Active Interventions: Provide education on orientation to the wound center Notes: Venous Leg Ulcer Nursing Diagnoses: Potential for venous Insuffiency (use before diagnosis confirmed) Goals: Patient will maintain optimal edema control Date Initiated: 11/27/2019 Target Resolution Date: 02/13/2020 Goal Status: Active Interventions: Compression as ordered Notes: Electronic Signature(s) Signed: 11/27/2019 1:36:47 PM By: Montey Hora Entered By: Montey Hora on 11/27/2019 10:51:15 Lanzo, [name]  ([id number]) -------------------------------------------------------------------------------- Non-Wound Condition Assessment Details Patient Name: [name]. Date of Service: 11/27/2019 9:45 AM Medical Record Number: [id number] Patient Account Number: [id number] Date of Birth/Sex: 12-Nov-1955 (64 y.o. F) Treating RN: Army Melia Primary Care Srishti Strnad: Tandy Gaw Other Clinician: Referring Kenetra Hildenbrand: Tandy Gaw Treating Cray Monnin/Extender: Ricard Dillon Weeks in Treatment: 0 Non-Wound Condition: Condition: Lymphedema Location: Leg Side: Right Photos Electronic Signature(s) Signed: 11/27/2019 11:08:48 AM By: Army Melia Entered By: Army Melia on 11/27/2019 10:17:01 Nuss, [name] ([id number]) -------------------------------------------------------------------------------- Pain Assessment Details Patient Name: [name]. Date of Service: 11/27/2019 9:45 AM Medical Record Number: [id number] Patient Account Number: [id number] Date of Birth/Sex: [birth year] (64 y.o. F) Treating RN: Army Melia Primary Care Haven Pylant: Tandy Gaw Other Clinician: Referring Latrish Mogel: Tandy Gaw Treating Johnattan Strassman/Extender: Ricard Dillon Weeks in Treatment: 0 Active Problems Location of Pain Severity and Description of Pain Patient Has Paino No Site Locations Pain Management and Medication Current Pain Management: Electronic Signature(s) Signed: 11/27/2019 11:08:48 AM By: Army Melia Entered By: Army Melia on 11/27/2019 10:10:50 [name] ([id number]) -------------------------------------------------------------------------------- Patient/Caregiver Education Details Patient Name: [name]. Date of Service: 11/27/2019 9:45 AM Medical Record Number: [id number] Patient Account Number: [id number] Date of Birth/Gender: 08-10-1955 (64 y.o. F) Treating RN: Montey Hora Primary Care Physician: Tandy Gaw Other Clinician: Referring Physician: Tandy Gaw Treating Physician/Extender: Tito Dine in Treatment: 0 Education Assessment Education Provided To: Patient Education Topics Provided Venous: Handouts: Other: use pumps, edema control Methods: Explain/Verbal Responses: State content correctly Electronic Signature(s) Signed: 11/27/2019 1:36:47 PM By: Montey Hora Entered By: Montey Hora on 11/27/2019 10:56:10 Laube, [name] ([id number]) -------------------------------------------------------------------------------- Vitals Details Patient Name: [name]. Date of Service: 11/27/2019 9:45 AM Medical Record Number: [id number] Patient Account Number: [id number] Date of Birth/Sex: Mar 15, 1956 (64 y.o. F) Treating RN: Army Melia Primary Care Jeanmarc Viernes: Tandy Gaw Other Clinician: Referring Aivah Putman: Tandy Gaw Treating Fiza Nation/Extender: Ricard Dillon Weeks in Treatment: 0 Vital Signs Time Taken: 10:10 Temperature (F): 98.2 Pulse (bpm): 71 Respiratory Rate (breaths/min): 16 Blood Pressure (mmHg): 118/82 Reference Range: 80 - 120 mg / dl Electronic Signature(s) Signed: 11/27/2019 11:08:48 AM By: Army Melia Entered By: Army Melia on 11/27/2019 10:12:36

## 2019-11-27 NOTE — Progress Notes (Signed)
[name], [name] ([id number]) Visit Report for 11/27/2019 Abuse/Suicide Risk Screen Details Patient Name: [name], [name]. Date of Service: 11/27/2019 9:45 AM Medical Record Number: [id number] Patient Account Number: [id number] Date of Birth/Sex: 06-Jul-1956 (64 y.o. F) Treating RN: Army Melia Primary Care Adja Ruff: Tandy Gaw Other Clinician: Referring Jannely Henthorn: Tandy Gaw Treating Kiyana Vazguez/Extender: Ricard Dillon Weeks in Treatment: 0 Abuse/Suicide Risk Screen Items Answer ABUSE RISK SCREEN: Has anyone close to you tried to hurt or harm you recentlyo No Do you feel uncomfortable with anyone in your familyo No Has anyone forced you do things that you didnot want to doo No Electronic Signature(s) Signed: 11/27/2019 11:08:48 AM By: Army Melia Entered By: Army Melia on 11/27/2019 10:19:16 [name], [name] ([id number]) -------------------------------------------------------------------------------- Activities of Daily Living Details Patient Name: [name]. Date of Service: 11/27/2019 9:45 AM Medical Record Number: [id number] Patient Account Number: [id number] Date of Birth/Sex: 08/28/1955 (64 y.o. F) Treating RN: Army Melia Primary Care Hyden Soley: Tandy Gaw Other Clinician: Referring Latanja Lehenbauer: Tandy Gaw Treating Carzell Saldivar/Extender: Ricard Dillon Weeks in Treatment: 0 Activities of Daily Living Items Answer Activities of Daily Living (Please select one for each item) Drive Automobile Not Able Take Medications Completely Able Use Telephone Completely Able Care for Appearance Completely Able Use Toilet Completely Able Bath / Shower Completely Able Dress Self Completely Able Feed Self Completely Able Walk Completely Able Get In / Out Bed Completely Able Housework Completely Able Prepare Meals Completely [place] for Self Completely Able Electronic Signature(s) Signed: 11/27/2019 11:08:48 AM By: Army Melia Entered  By: Army Melia on 11/27/2019 10:19:28 [name] ([id number]) -------------------------------------------------------------------------------- Education Screening Details Patient Name: [name]. Date of Service: 11/27/2019 9:45 AM Medical Record Number: [id number] Patient Account Number: [id number] Date of Birth/Sex: Sep 26, 1955 (64 y.o. F) Treating RN: Army Melia Primary Care Twana Wileman: Tandy Gaw Other Clinician: Referring Anjelita Sheahan: Tandy Gaw Treating Lindee Leason/Extender: Tito Dine in Treatment: 0 Primary Learner Assessed: Patient Learning Preferences/Education Level/Primary Language Learning Preference: Explanation, Demonstration Highest Education Level: College or Above Preferred Language: English Cognitive Barrier Language Barrier: No Translator Needed: No Memory Deficit: No Emotional Barrier: No Cultural/Religious Beliefs Affecting Medical Care: No Physical Barrier Impaired Vision: No Impaired Hearing: No Decreased Hand dexterity: No Knowledge/Comprehension Knowledge Level: High Comprehension Level: High Ability to understand written instructions: High Ability to understand verbal instructions: High Motivation Anxiety Level: Calm Cooperation: Cooperative Education Importance: Acknowledges Need Interest in Health Problems: Asks Questions Perception: Coherent Willingness to Engage in Self-Management High Activities: Readiness to Engage in Self-Management High Activities: Electronic Signature(s) Signed: 11/27/2019 11:08:48 AM By: Army Melia Entered By: Army Melia on 11/27/2019 10:19:46 [name] ([id number]) -------------------------------------------------------------------------------- Fall Risk Assessment Details Patient Name: [name]. Date of Service: 11/27/2019 9:45 AM Medical Record Number: [id number] Patient Account Number: [id number] Date of Birth/Sex: 31-Jul-1955 (64 y.o. F) Treating RN: Army Melia Primary Care Othar Curto: Tandy Gaw Other Clinician: Referring Chinara Hertzberg: Tandy Gaw Treating Shaneka Efaw/Extender: Tito Dine in Treatment: 0 Fall Risk Assessment Items Have you had 2 or more falls in the last 12 monthso 0 No Have you had any fall that resulted in injury in the last 12 monthso 0 No FALLS RISK SCREEN History of falling - immediate or within 3 months 0 No Secondary diagnosis (Do you have 2 or more medical diagnoseso) 0 No Ambulatory aid None/bed rest/wheelchair/nurse 0 No Crutches/cane/walker 15 Yes Furniture 0 No Intravenous therapy Access/Saline/Heparin Lock 0 No Gait/Transferring Normal/ bed rest/ wheelchair 0 No Weak (short  steps with or without shuffle, stooped but able to lift head while walking, may 0 No seek support from furniture) Impaired (short steps with shuffle, may have difficulty arising from chair, head down, impaired 0 No balance) Mental Status Oriented to own ability 0 No Electronic Signature(s) Signed: 11/27/2019 11:08:48 AM By: Army Melia Entered By: Army Melia on 11/27/2019 10:19:54 [name], [name] ([id number]) -------------------------------------------------------------------------------- Foot Assessment Details Patient Name: Elizabeth Landau C. Date of Service: 11/27/2019 9:45 AM Medical Record Number: [id number] Patient Account Number: [id number] Date of Birth/Sex: 12-09-1955 (64 y.o. F) Treating RN: Army Melia Primary Care Jairy Angulo: Tandy Gaw Other Clinician: Referring Markiya Keefe: Tandy Gaw Treating Camar Guyton/Extender: Ricard Dillon Weeks in Treatment: 0 Foot Assessment Items Site Locations + = Sensation present, - = Sensation absent, C = Callus, U = Ulcer R = Redness, W = Warmth, M = Maceration, PU = Pre-ulcerative lesion F = Fissure, S = Swelling, D = Dryness Assessment Right: Left: Other Deformity: No No Prior Foot Ulcer: No No Prior Amputation: No No Charcot Joint: No No Ambulatory  Status: Gait: Electronic Signature(s) Signed: 11/27/2019 11:08:48 AM By: Army Melia Entered By: Army Melia on 11/27/2019 10:20:06 [name] ([id number]) -------------------------------------------------------------------------------- Nutrition Risk Screening Details Patient Name: Elizabeth Landau C. Date of Service: 11/27/2019 9:45 AM Medical Record Number: [id number] Patient Account Number: [id number] Date of Birth/Sex: May 05, 1956 (64 y.o. F) Treating RN: Army Melia Primary Care Kupono Marling: Tandy Gaw Other Clinician: Referring Kaitlinn Iversen: Tandy Gaw Treating Marley Charlot/Extender: Ricard Dillon Weeks in Treatment: 0 Height (in): Weight (lbs): Body Mass Index (BMI): Nutrition Risk Screening Items Score Screening NUTRITION RISK SCREEN: I have an illness or condition that made me change the kind and/or amount of food I eat 0 No I eat fewer than two meals per day 0 No I eat few fruits and vegetables, or milk products 0 No I have three or more drinks of beer, liquor or wine almost every day 0 No I have tooth or mouth problems that make it hard for me to eat 0 No I don't always have enough money to buy the food I need 0 No I eat alone most of the time 0 No I take three or more different prescribed or over-the-counter drugs a day 0 No Without wanting to, I have lost or gained 10 pounds in the last six months 0 No I am not always physically able to shop, cook and/or feed myself 0 No Nutrition Protocols Good Risk Protocol 0 No interventions needed Moderate Risk Protocol High Risk Proctocol Risk Level: Good Risk Score: 0 Electronic Signature(s) Signed: 11/27/2019 11:08:48 AM By: Army Melia Entered By: Army Melia on 11/27/2019 10:19:59

## 2019-12-11 ENCOUNTER — Ambulatory Visit: Payer: Medicaid Other | Admitting: Physician Assistant

## 2019-12-15 ENCOUNTER — Other Ambulatory Visit: Payer: Self-pay

## 2019-12-15 ENCOUNTER — Encounter: Payer: Medicaid Other | Attending: Physician Assistant | Admitting: Physician Assistant

## 2019-12-15 DIAGNOSIS — M109 Gout, unspecified: Secondary | ICD-10-CM | POA: Insufficient documentation

## 2019-12-15 DIAGNOSIS — Z888 Allergy status to other drugs, medicaments and biological substances status: Secondary | ICD-10-CM | POA: Insufficient documentation

## 2019-12-15 DIAGNOSIS — E11622 Type 2 diabetes mellitus with other skin ulcer: Secondary | ICD-10-CM | POA: Diagnosis not present

## 2019-12-15 DIAGNOSIS — G4730 Sleep apnea, unspecified: Secondary | ICD-10-CM | POA: Insufficient documentation

## 2019-12-15 DIAGNOSIS — Q820 Hereditary lymphedema: Secondary | ICD-10-CM | POA: Insufficient documentation

## 2019-12-15 DIAGNOSIS — L97811 Non-pressure chronic ulcer of other part of right lower leg limited to breakdown of skin: Secondary | ICD-10-CM | POA: Diagnosis not present

## 2019-12-15 DIAGNOSIS — J45909 Unspecified asthma, uncomplicated: Secondary | ICD-10-CM | POA: Insufficient documentation

## 2019-12-15 DIAGNOSIS — M1990 Unspecified osteoarthritis, unspecified site: Secondary | ICD-10-CM | POA: Diagnosis not present

## 2019-12-15 DIAGNOSIS — I10 Essential (primary) hypertension: Secondary | ICD-10-CM | POA: Diagnosis not present

## 2019-12-15 DIAGNOSIS — Z886 Allergy status to analgesic agent status: Secondary | ICD-10-CM | POA: Diagnosis not present

## 2019-12-15 DIAGNOSIS — I890 Lymphedema, not elsewhere classified: Secondary | ICD-10-CM | POA: Diagnosis present

## 2019-12-15 NOTE — Progress Notes (Signed)
KARRISSA, PARCHMENT ([id number]) Visit Report for 12/15/2019 Chief Complaint Document Details Patient Name: [name], [name]. Date of Service: 12/15/2019 8:00 AM Medical Record Number: [id number] Patient Account Number: [id number] Date of Birth/Sex: 31-Oct-1955 (64 y.o. F) Treating RN: Montey Hora Primary Care Provider: Tandy Gaw Other Clinician: Referring Provider: Tandy Gaw Treating Provider/Extender: Melburn Hake, Jasiah Buntin Weeks in Treatment: 2 Information Obtained from: Patient Chief Complaint Bilateral LE lymphedema Electronic Signature(s) Signed: 12/15/2019 3:18:05 PM By: Worthy Keeler PA-C Entered By: Worthy Keeler on 12/15/2019 08:13:50 Rowand, [name] ([id number]) -------------------------------------------------------------------------------- HPI Details Patient Name: [name]. Date of Service: 12/15/2019 8:00 AM Medical Record Number: [id number] Patient Account Number: [id number] Date of Birth/Sex: 1956-02-04 (64 y.o. F) Treating RN: Montey Hora Primary Care Provider: Tandy Gaw Other Clinician: Referring Provider: Tandy Gaw Treating Provider/Extender: STONE III, Kiyaan Haq Weeks in Treatment: 2 History of Present Illness HPI Description: The patient is a 64 year old female with history of hypertension and a long-standing history of bilateral lower extremity lymphedema (first presented on 4/2) . She has had open ulcers in the past which have always responded to compression therapy. She had briefly been to a lymphedema clinic in the past which helped her at the time. this time around she stopped treatment of her lymphedema pumps approximately 2 weeks ago because of some pain in the knees and then noticed the right leg getting worse. She was seen by her PCP who put her on clindamycin 4 times a day 2 days ago. The patient has seen [facility] and Dr. Delana Meyer had seen her last year where a vascular study including venous and arterial duplex studies were within normal  limits. he had recommended compression stockings and lymphedema pumps and the patient has been using this in about 2 weeks ago. She is known to be diabetic but in the past few time she's gone to her primary care doctor her hemoglobin A1c has been normal. 02/11/2015 - after her last visit she took my advice and went to the ER regarding the progressive cellulitis of her right lower extremity and she was admitted between July 17 and 22nd. She received IV antibiotics and then was sent home on a course of steroid-induced and oral antibiotics. She has improved much since then. 02/17/2015 -- she has been doing fine and the weeping of her legs has remarkably gone down. She has no fresh issues. READMISSION 01/15/18 This patient was given this clinic before most recently in 2016 seen by Dr. Con Memos. She has massive bilateral lymphedema and over the last 2 months this had weeping edema out of the left leg. She has compression pumps but her compliance with these has been minimal. She has [facility] they've been using TCA/ABDs/kerlix under an Ace wrap.she has had recent problems with cellulitis. She was apparently seen in the ER and 12/23/17 and given clindamycin. She was then followed by her primary doctor and given doxycycline and Keflex. The pain seems to have settled down. In April 2018 the patient had arterial studies done at [facility]. This showed triphasic waveforms throughout the right leg and mostly triphasic waveforms on the left except for monophasic at the posterior tibial artery distally. She was not felt to have evidence of right lower extremity arterial stenosis or significant problems on the left side. 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 She also had a right lower extremity venous Doppler in January 2018 this was limited by the patient's body habitus and lymphedema. Most of the  proximal veins were not visualized The patient presents with an  area of denuded skin on the anterior medial part of the left calf. There is weeping edema fluid here. 01/22/18; the patient has somewhat better edema control using her compression pumps twice a day and as a result she has much better epithelialization on the left anterior calf area. Only a small open area remains. 01/29/18; the patient has been compliant with her compression pumps. Both the areas on her calf that healed. The remaining area on the left anterior leg is fully epithelialized Readmission: 02/20/2019 upon evaluation today patient presents for reevaluation due to issues that she is having with the bilateral lower extremities. She actually has wounds open on both legs. On the right she has an area in the crease of her leg on the right around the knee region which is actually draining quite a bit and actually has some fungal type appearance to it. She has been on nystatin powder that seems to have helped to some degree. In regard to the left lower extremity this is actually in the lower portion of her leg closer to the ankle and again is continuing to drain as well unfortunately. There does not appear to be any signs of active infection at this time which is good news. No fevers, chills, nausea, vomiting, or diarrhea. She tells me that since she was seen last year she is actually been doing quite well for the most part with regard to her lower extremities. Unfortunately she now is experiencing a little bit more drainage at this time. She is concerned about getting this under control so that it does not get significantly worse. 02/27/2019 on evaluation today patient appears to be doing somewhat better in regard to her bilateral lower extremity wounds. She has been tolerating the dressing changes without complication. Fortunately there is no signs of active infection at this point. No fevers, chills, nausea, vomiting, or diarrhea. She did get her dressing supplies which is excellent news she was  extremely excited to get these. She also got paperwork from [employer] for their financial assistance program where they may be able to help her out in the future if needed with supplies at discounted prices. 03/06/2019 on evaluation today patient appears to be doing a little worse with regard to both areas of weeping on her bilateral lower extremities. This is around the right medial knee and just above the left ankle. With that being said she is unfortunately not doing as well as I would like to see. I feel like she may need to potentially go see someone at the lymphedema clinic as the wraps that she needs or even beyond what we can do here at the wound care center. She really does not have wounds she just has open areas of weeping that are causing some difficulty for her. Subsequently because of this and the moisture I am concerned about the potential for infection I am going to likely give her a prophylactic antibiotic today, Keflex, just to be on the safe side. Nonetheless again there is no obvious signs of active infection at this time. 03/13/2019 on evaluation today patient appears to be doing well with regard to her bilateral lower extremities where she has been weeping compared to even last week's evaluation. I see some areas of new skin growth which is excellent and overall I am very pleased with how things seem to be progressing. No fevers, chills, nausea, vomiting, or diarrhea. VENNA, BERBERICH ([id number]) 03/20/2019 on evaluation  today patient unfortunately is continuing to have issues with significant edema of the left lower extremity. Her right side seems to be doing much better. Unfortunately her left side is showing increased weeping of the lower portion of her leg. This is quite unfortunate obviously we were hoping to get her into the lymphedema clinic they really do not seem to when I see her how if she is draining. Despite the fact this is really not wound related but more lymphedema  weeping related. Nonetheless I do not know that this can be helpful for her to even go for that appointment since again I am not sure there is much that they would actually do at this point. We may need to try a 4 layer compression wrap as best we can on her leg. She is on the Augmentin currently although I am still concerned about whether or not there could be potentially something going on infection wise I would obtain a culture though I understand is not the best being that is a surface culture I just 1 to make sure I do not seem to be missing anything. 03/27/2019 on evaluation today patient appears to be doing much better in regard to the left lower extremity compared to last week. Last week she had tremendous weeping which I think was subsequent to infection now she seems to be doing much better and very pleased. This is not completely healed but there is a lot of new skin growth and it has dried out quite a bit. Overall I think that we are doing well with how things are moving along at this time. No fevers, chills, nausea, vomiting, or diarrhea. 04/03/2019 evaluation today patient appears to be doing a little worse this week compared to last time I saw her. I think this may be due to the fact that she is having issues with not being able to sleep in her bed at least not until last night. She is therefore been in a lift chair and subsequently has also had issues with not been able to use her pumps since she could not get in bed. With that being said the patient overall seems to be doing okay I do think I may want extend the antibiotic for a little bit longer at least until we can see if her edema and her weeping gets better and if it is then obviously I can always discontinue the antibiotics as of next week however I want her to continue to have it over the next week. 04/10/2019 on evaluation today patient unfortunately is still doing poorly with regard to her left lower extremity. Her right is all  things considering doing fairly well. On the left however she continues to have spreading of the area of infection and weeping which appears to be even a larger surface area than noted last week. She did have a positive culture for Pseudomonas in particular which seems to have been of concern she still has green/yellow discharge consistent with Pseudomonas and subsequently a tremendous amount of it. This has me obviously still concerned about the infection not really clearing up despite the fact that on culture it appears the Cipro should have been a good option for treating this. I think she may at this point need IV antibiotics since things are not doing better I do not want to get worse and cause sepsis. She is in agreement with the plan and believes as well that she likely does need to go to the hospital for  IV vancomycin. Or something of the like depending on what the recommendation is from the ER. 04/17/2019 on evaluation today patient appears to be doing excellent in regard to her lower extremity on the left. She was in the hospital for several days from when I sent her last we saw her until just this past Tuesday. Fortunately her drainage is significantly improved and in fact is mostly clear. There is just a couple small areas that may still drain a little bit she states that the [facility] they prescribed for her at discharge she went picked up from pharmacy and got home but has not been able to find it since. She is looked everywhere. She is wondering if I will replace that for her today I will be more than happy to do that. 05/01/2019 on evaluation today patient actually appears to be doing quite well with regard to her lower extremities. She occasionally is having areas that will leak and then heal up mainly when a piece of the fibrotic skin pops off but fortunately she is not having any signs of active infection at this time. Overall she also really does not have any obvious weeping at this  time. I do believe however she really needs some compression wraps and I think this may be a good time to get her back to the lymphedema clinic. 05/11/2019 on evaluation today patient actually appears to be doing quite well with regard to her bilateral lower extremities. She occasionally will have a small area that we per another but in general seems to be completely healed which is great news. Overall very pleased with how everything seems to be progressing. She does have her appointment with lymphedema clinic on November 18. 05/25/2019 on evaluation today patient appears to be doing well with regard to her left lower extremity. I am very pleased in this regard. In regard to her right leg this actually did start draining more I think it is mainly due to the fact that her leg is more swollen. I am not seeing any obvious signs of infection at this time although that is definitely something were obviously acutely aware of simply due to the fact that she had an issue not too far back with exactly this issue. Nonetheless I do feel like that lymphedema clinic would still be beneficial for her. I explained obviously if they are not able to do anything treatment wise on the right leg we could at least have them treat her left leg and then proceed from there. The patient is really in agreement with that plan. If they are able to do both as the drainage slows down that I would be happy to let them handle both. 06/01/2019 on evaluation today patient unfortunately appears to be doing worse with regard to her right lower extremity. The left lower extremity is still maintaining at this point. Unfortunately she has been having significantly increased pain over the past several days and has been experiencing as well increased swelling of the right lower extremity. I really do not know that I am seeing anything that appears to be obvious for infection at this point to be peripherally honest. With that being said the  patient does seem to be having much more swelling that she is even experienced in the past and coupled with increased pain in her hip as well I am concerned that again she could potentially have a DVT although I am not 100% sure of this. I think it something that may need to  be checked out. We discussed the possibility of sending her for a DVT study through the hospital but unfortunately transportation is an issue if she does have a DVT I do not want her to wait days to be able to get in for that test however if she has this scheduled as an outpatient that is as fast that she will be able to get the test scheduled for transportation purposes. That will also fall on Thanksgiving so subsequently she did actually be looking at either Friday or even next week before we would know anything back from this. That is much too long in my opinion. Subsequent to the amount of discomfort she is experiencing the patient is actually okay with going to the ER for evaluation today. 06/12/2019 on evaluation today patient actually appears to be doing significantly better compared to last time I saw her. Following when I last saw her she was actually in the hospital from that Monday until the following Sunday almost 1 full week. She actually was placed on Keflex in the hospital following the time for her to be discharged and Dr. Steva Ready has recommended 2 times a day dosing of the Keflex for the next year in order to help with more prophylactic/preventative measures with regard to her developing cellulitis. Overall I think this sounds like an excellent plan. The patient unfortunately is good to have trouble being treated at lymphedema clinic due to the fact that she really cannot get up on the bed that they have there. They also state that they cannot manage her as long as she has anything draining at this point. Obviously that is somewhat unfortunate as she does need help with edema control but nonetheless we will have  to do what we can for her outside of it sounds like the lymphedema clinic scenario at this point. 06/19/2019 on evaluation today patient appears to be doing fairly well with regard to her bilateral lower extremities. She is not nearly as swollen and shows no signs of infection at this point. There is no evidence of cellulitis whatsoever. She also has no open wounds or draining at this point which is also good news. No fever chills noted. She seems to be in very good spirits and in fact appears to be doing quite well. READMISSION 11/27/2019 JOLISSA, KAPRAL ([id number]) This is a 64 year old woman that we have had in this clinic several times before including 2015, 16 and 19 and then most recently from 03/20/2019 through 06/19/2019 with bilateral lower extremity lymphedema. She has had previous arterial and reflux studies done years ago which were not all that remarkable. 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 She does have a positive family history and she had large legs starting may be in her 36s. She was recently in hospital from 10/20/2019 through 10/28/2019 with right leg cellulitis. She was given Ancef and clindamycin and then Zosyn when a culture showed Pseudomonas. At that time there was purulent drainage. She was followed by infectious disease Dr Steva Ready. The patient is now back at home. She has noted increased swelling in the right and no drainage in her right leg mostly on the posterior medial aspect in the calf area. She has not had pain or fever. She has literally been improved lysing above dressings because her at the area of this is far too large for standard compression. She has been wrapping the areas with sheets to resorptive pads. She is found these helped  somewhat. She does have an appointment with the lymphedema clinic in [place] in late June. Past medical history includes bilateral lymphedema, hypertension,  obstructive sleep apnea with CPAP. Recent hospitalization with apparently Pseudomonas cellulitis of the right lower leg 12/15/2019 upon evaluation today patient appears to be doing a little bit worse in regard to her right lower extremity. Unfortunately she is having more weeping down in the lower portion of her leg. Fortunately there is no signs of active infection at this time. No fever chills noted. The patient states she is not having increased pain except for when she attempted to use the lymphedema pumps unfortunately she states that she did have pain when she did this. Otherwise we been using absorptive dressings of one type or another she is using diapers at home and then subsequently Ace wraps. In regard to the barrier cream we have discussed the possibility of derma cloud which she would like to try I do not have a problem with that. Electronic Signature(s) Signed: 12/15/2019 8:40:21 AM By: Worthy Keeler PA-C Entered By: Worthy Keeler on 12/15/2019 08:40:21 Jaskulski, [name] ([id number]) -------------------------------------------------------------------------------- Physical Exam Details Patient Name: JAILYNNE, OPPERMAN C. Date of Service: 12/15/2019 8:00 AM Medical Record Number: [id number] Patient Account Number: [id number] Date of Birth/Sex: [birth year] (64 y.o. F) Treating RN: Montey Hora Primary Care Provider: Tandy Gaw Other Clinician: Referring Provider: Tandy Gaw Treating Provider/Extender: STONE III, Akiva Josey Weeks in Treatment: 2 Constitutional Obese and well-hydrated in no acute distress. Respiratory normal breathing without difficulty. Psychiatric this patient is able to make decisions and demonstrates good insight into disease process. Alert and Oriented x 3. pleasant and cooperative. Notes Upon inspection patient's leg again showed signs of weeping in general this is worse in the lower portion of her leg at this time. Fortunately there is no signs of significant  infection obvious there is no erythema or warmth there is no significant pain just upon palpation and evaluation today. With that being said she unfortunately does have significant weeping of the lower portion of her leg which has me concerned as far as the possibility for infection setting and at some point if we do not get this under control. Electronic Signature(s) Signed: 12/15/2019 8:40:57 AM By: Worthy Keeler PA-C Entered By: Worthy Keeler on 12/15/2019 08:40:57 Faiola, [name] ([id number]) -------------------------------------------------------------------------------- Physician Orders Details Patient Name: [name] Date of Service: 12/15/2019 8:00 AM Medical Record Number: [id number] Patient Account Number: [id number] Date of Birth/Sex: Dec 09, 1955 (64 y.o. 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 - and as needed Follow-up Appointments o Return Appointment in 1 week. Edema Control o Elevate legs to the level of the heart and pump ankles as often as possible o Compression Pump: Use compression pump on left lower extremity for 60 minutes, twice daily. o Compression Pump: Use compression pump on right lower extremity for 60 minutes, twice daily. o Other: - ACE wraps Patient Medications Allergies: ibuprofen, ACE Inhibitors Notifications Medication Indication Start End Dermacloud 12/15/2019 DOSE topical ointment - ointment topical applied each day to the open and draining areas of her right  Leg  when the patient changes her dressings Electronic Signature(s) Signed: 12/15/2019 2:05:26 PM By: Montey Hora Signed: 12/15/2019 3:18:05 PM By: Worthy Keeler PA-C Previous Signature: 12/15/2019 8:45:11 AM Version By: Worthy Keeler PA-C Entered By: Montey Hora on 12/15/2019 08:46:51 Stennett, [name] ([id number]) -------------------------------------------------------------------------------- Problem List Details Patient Name: STEFANIA, GOULART. Date of Service: 12/15/2019 8:00 AM Medical Record Number: [id number] Patient Account Number: [id number] Date of Birth/Sex: April 12, 1956 (64 y.o. 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 Date of Service: 12/15/2019 8:00 AM Medical Record Number: [id number] Patient Account Number: [id number] Date of Birth/Sex: [birth year] (64 y.o. F) Treating RN: Montey Hora Primary Care Provider: Tandy Gaw Other Clinician: Referring Provider: Tandy Gaw Treating Provider/Extender: STONE III, Juana Montini Weeks in Treatment: 2 Subjective Chief Complaint Information obtained from Patient Bilateral LE lymphedema History of Present Illness (HPI) The patient is a 64 year old female with history of hypertension and a long-standing history of bilateral lower  extremity lymphedema (first presented on 4/2) . She has had open ulcers in the past which have always responded to compression therapy. She had briefly been to a lymphedema clinic in the past which helped her at the time. this time around she stopped treatment of her lymphedema pumps approximately 2 weeks ago because of some pain in the knees and then noticed the right leg getting worse. She was seen by her PCP who put her on clindamycin 4 times a day 2 days ago. The patient has seen [facility] and Dr. Delana Meyer had seen her last year where a vascular study including venous and arterial duplex studies were within normal limits. he had recommended compression stockings and lymphedema pumps and the patient has been using this in about 2 weeks ago. She is known to be diabetic but in the past few time she's gone to her primary care doctor her hemoglobin A1c has been normal. 02/11/2015 - after her last visit she took my advice and went to the ER regarding the progressive cellulitis of her right lower extremity and she was admitted between July 17 and 22nd. She received IV antibiotics and then was sent home on a course of steroid-induced and oral antibiotics. She has improved much since then. 02/17/2015 -- she has been doing fine and the weeping of her legs has remarkably gone down. She has no fresh issues. READMISSION 01/15/18 This patient was given this clinic before most recently in 2016 seen by Dr. Con Memos. She has massive bilateral lymphedema and over the last 2 months this had weeping edema out of the left leg. She has compression pumps but her compliance with these has been minimal. She has [facility] they've been using TCA/ABDs/kerlix under an Ace wrap.she has had recent problems with cellulitis. She was apparently seen in the ER and 12/23/17 and given clindamycin. She was then followed by her primary doctor and given doxycycline and Keflex. The pain seems to have settled down. In April 2018 the  patient had arterial studies done at [facility]. This  showed triphasic waveforms throughout the right leg and mostly triphasic waveforms on the left except for monophasic at the posterior tibial artery distally. She was not felt to have evidence of right lower extremity arterial stenosis or significant problems on the left side. 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 She also had a right lower extremity venous Doppler in January 2018 this was limited by the patient's body habitus and lymphedema. Most of the proximal veins were not visualized The patient presents with an area of denuded skin on the anterior medial part of the left calf. There is weeping edema fluid here. 01/22/18; the patient has somewhat better edema control using her compression pumps twice a day and as a result she has much better epithelialization on the left anterior calf area. Only a small open area remains. 01/29/18; the patient has been compliant with her compression pumps. Both the areas on her calf that healed. The remaining area on the left anterior leg is fully epithelialized Readmission: 02/20/2019 upon evaluation today patient presents for reevaluation due to issues that she is having with the bilateral lower extremities. She actually has wounds open on both legs. On the right she has an area in the crease of her leg on the right around the knee region which is actually draining quite a bit and actually has some fungal type appearance to it. She has been on nystatin powder that seems to have helped to some degree. In regard to the left lower extremity this is actually in the lower portion of her leg closer to the ankle and again is continuing to drain as well unfortunately. There does not appear to be any signs of active infection at this time which is good news. No fevers, chills, nausea, vomiting, or diarrhea. She tells me that since she was seen last year she is actually been  doing quite well for the most part with regard to her lower extremities. Unfortunately she now is experiencing a little bit more drainage at this time. She is concerned about getting this under control so that it does not get significantly worse. 02/27/2019 on evaluation today patient appears to be doing somewhat better in regard to her bilateral lower extremity wounds. She has been tolerating the dressing changes without complication. Fortunately there is no signs of active infection at this point. No fevers, chills, nausea, vomiting, or diarrhea. She did get her dressing supplies which is excellent news she was extremely excited to get these. She also got paperwork from [employer] for their financial assistance program where they may be able to help her out in the future if needed with supplies at discounted prices. 03/06/2019 on evaluation today patient appears to be doing a little worse with regard to both areas of weeping on her bilateral lower extremities. This is around the right medial knee and just above the left ankle. With that being said she is unfortunately not doing as well as I would like to see. I feel like she may need to potentially go see someone at the lymphedema clinic as the wraps that she needs or even beyond what we can do here at the wound care center. She really does not have wounds she just has open areas of weeping that are causing some difficulty for her. Subsequently because of this and the moisture I am concerned about the potential for infection I am going to likely give her a prophylactic antibiotic today, Keflex, just to be on the safe side. Nonetheless  again there is no obvious signs of active infection at this time. TYSON, MASIN ([id number]) 03/13/2019 on evaluation today patient appears to be doing well with regard to her bilateral lower extremities where she has been weeping compared to even last week's evaluation. I see some areas of new skin growth which is  excellent and overall I am very pleased with how things seem to be progressing. No fevers, chills, nausea, vomiting, or diarrhea. 03/20/2019 on evaluation today patient unfortunately is continuing to have issues with significant edema of the left lower extremity. Her right side seems to be doing much better. Unfortunately her left side is showing increased weeping of the lower portion of her leg. This is quite unfortunate obviously we were hoping to get her into the lymphedema clinic they really do not seem to when I see her how if she is draining. Despite the fact this is really not wound related but more lymphedema weeping related. Nonetheless I do not know that this can be helpful for her to even go for that appointment since again I am not sure there is much that they would actually do at this point. We may need to try a 4 layer compression wrap as best we can on her leg. She is on the Augmentin currently although I am still concerned about whether or not there could be potentially something going on infection wise I would obtain a culture though I understand is not the best being that is a surface culture I just 1 to make sure I do not seem to be missing anything. 03/27/2019 on evaluation today patient appears to be doing much better in regard to the left lower extremity compared to last week. Last week she had tremendous weeping which I think was subsequent to infection now she seems to be doing much better and very pleased. This is not completely healed but there is a lot of new skin growth and it has dried out quite a bit. Overall I think that we are doing well with how things are moving along at this time. No fevers, chills, nausea, vomiting, or diarrhea. 04/03/2019 evaluation today patient appears to be doing a little worse this week compared to last time I saw her. I think this may be due to the fact that she is having issues with not being able to sleep in her bed at least not until last  night. She is therefore been in a lift chair and subsequently has also had issues with not been able to use her pumps since she could not get in bed. With that being said the patient overall seems to be doing okay I do think I may want extend the antibiotic for a little bit longer at least until we can see if her edema and her weeping gets better and if it is then obviously I can always discontinue the antibiotics as of next week however I want her to continue to have it over the next week. 04/10/2019 on evaluation today patient unfortunately is still doing poorly with regard to her left lower extremity. Her right is all things considering doing fairly well. On the left however she continues to have spreading of the area of infection and weeping which appears to be even a larger surface area than noted last week. She did have a positive culture for Pseudomonas in particular which seems to have been of concern she still has green/yellow discharge consistent with Pseudomonas and subsequently a tremendous amount of it. This has  me obviously still concerned about the infection not really clearing up despite the fact that on culture it appears the Cipro should have been a good option for treating this. I think she may at this point need IV antibiotics since things are not doing better I do not want to get worse and cause sepsis. She is in agreement with the plan and believes as well that she likely does need to go to the hospital for IV vancomycin. Or something of the like depending on what the recommendation is from the ER. 04/17/2019 on evaluation today patient appears to be doing excellent in regard to her lower extremity on the left. She was in the hospital for several days from when I sent her last we saw her until just this past Tuesday. Fortunately her drainage is significantly improved and in fact is mostly clear. There is just a couple small areas that may still drain a little bit she states that  the [facility] they prescribed for her at discharge she went picked up from pharmacy and got home but has not been able to find it since. She is looked everywhere. She is wondering if I will replace that for her today I will be more than happy to do that. 05/01/2019 on evaluation today patient actually appears to be doing quite well with regard to her lower extremities. She occasionally is having areas that will leak and then heal up mainly when a piece of the fibrotic skin pops off but fortunately she is not having any signs of active infection at this time. Overall she also really does not have any obvious weeping at this time. I do believe however she really needs some compression wraps and I think this may be a good time to get her back to the lymphedema clinic. 05/11/2019 on evaluation today patient actually appears to be doing quite well with regard to her bilateral lower extremities. She occasionally will have a small area that we per another but in general seems to be completely healed which is great news. Overall very pleased with how everything seems to be progressing. She does have her appointment with lymphedema clinic on November 18. 05/25/2019 on evaluation today patient appears to be doing well with regard to her left lower extremity. I am very pleased in this regard. In regard to her right leg this actually did start draining more I think it is mainly due to the fact that her leg is more swollen. I am not seeing any obvious signs of infection at this time although that is definitely something were obviously acutely aware of simply due to the fact that she had an issue not too far back with exactly this issue. Nonetheless I do feel like that lymphedema clinic would still be beneficial for her. I explained obviously if they are not able to do anything treatment wise on the right leg we could at least have them treat her left leg and then proceed from there. The patient is really in agreement  with that plan. If they are able to do both as the drainage slows down that I would be happy to let them handle both. 06/01/2019 on evaluation today patient unfortunately appears to be doing worse with regard to her right lower extremity. The left lower extremity is still maintaining at this point. Unfortunately she has been having significantly increased pain over the past several days and has been experiencing as well increased swelling of the right lower extremity. I really do not know  that I am seeing anything that appears to be obvious for infection at this point to be peripherally honest. With that being said the patient does seem to be having much more swelling that she is even experienced in the past and coupled with increased pain in her hip as well I am concerned that again she could potentially have a DVT although I am not 100% sure of this. I think it something that may need to be checked out. We discussed the possibility of sending her for a DVT study through the hospital but unfortunately transportation is an issue if she does have a DVT I do not want her to wait days to be able to get in for that test however if she has this scheduled as an outpatient that is as fast that she will be able to get the test scheduled for transportation purposes. That will also fall on Thanksgiving so subsequently she did actually be looking at either Friday or even next week before we would know anything back from this. That is much too long in my opinion. Subsequent to the amount of discomfort she is experiencing the patient is actually okay with going to the ER for evaluation today. 06/12/2019 on evaluation today patient actually appears to be doing significantly better compared to last time I saw her. Following when I last saw her she was actually in the hospital from that Monday until the following Sunday almost 1 full week. She actually was placed on Keflex in the hospital following the time for her to  be discharged and Dr. Steva Ready has recommended 2 times a day dosing of the Keflex for the next year in order to help with more prophylactic/preventative measures with regard to her developing cellulitis. Overall I think this sounds like an excellent plan. The patient unfortunately is good to have trouble being treated at lymphedema clinic due to the fact that she really cannot get up on the bed that they have there. They also state that they cannot manage her as long as she has anything draining at this point. Obviously that is somewhat unfortunate as she does need help with edema control but nonetheless we will have to do what we can for her outside of it sounds like the lymphedema clinic scenario at this point. 06/19/2019 on evaluation today patient appears to be doing fairly well with regard to her bilateral lower extremities. She is not nearly as swollen Allender, Lea C. ([id number]) and shows no signs of infection at this point. There is no evidence of cellulitis whatsoever. She also has no open wounds or draining at this point which is also good news. No fever chills noted. She seems to be in very good spirits and in fact appears to be doing quite well. READMISSION 11/27/2019 This is a 64 year old woman that we have had in this clinic several times before including 2015, 16 and 19 and then most recently from 03/20/2019 through 06/19/2019 with bilateral lower extremity lymphedema. She has had previous arterial and reflux studies done years ago which were not all that remarkable. 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 She does have a positive family history and she had large legs starting may be in her 45s. She was recently in hospital from 10/20/2019 through 10/28/2019 with right leg cellulitis. She was given Ancef and clindamycin and then Zosyn when a culture showed Pseudomonas. At that time there was purulent drainage. She was followed  by infectious disease  Dr Steva Ready. The patient is now back at home. She has noted increased swelling in the right and no drainage in her right leg mostly on the posterior medial aspect in the calf area. She has not had pain or fever. She has literally been improved lysing above dressings because her at the area of this is far too large for standard compression. She has been wrapping the areas with sheets to resorptive pads. She is found these helped somewhat. She does have an appointment with the lymphedema clinic in [place] in late June. Past medical history includes bilateral lymphedema, hypertension, obstructive sleep apnea with CPAP. Recent hospitalization with apparently Pseudomonas cellulitis of the right lower leg 12/15/2019 upon evaluation today patient appears to be doing a little bit worse in regard to her right lower extremity. Unfortunately she is having more weeping down in the lower portion of her leg. Fortunately there is no signs of active infection at this time. No fever chills noted. The patient states she is not having increased pain except for when she attempted to use the lymphedema pumps unfortunately she states that she did have pain when she did this. Otherwise we been using absorptive dressings of one type or another she is using diapers at home and then subsequently Ace wraps. In regard to the barrier cream we have discussed the possibility of derma cloud which she would like to try I do not have a problem with that. Objective Constitutional Obese and well-hydrated in no acute distress. Vitals Time Taken: 8:13 AM, Temperature: 98.2 F, Pulse: 75 bpm, Respiratory Rate: 16 breaths/min, Blood Pressure: 138/72 mmHg. Respiratory normal breathing without difficulty. Psychiatric this patient is able to make decisions and demonstrates good insight into disease process. Alert and Oriented x 3. pleasant and cooperative. General Notes: Upon inspection patient's leg again  showed signs of weeping in general this is worse in the lower portion of her leg at this time. Fortunately there is no signs of significant infection obvious there is no erythema or warmth there is no significant pain just upon palpation and evaluation today. With that being said she unfortunately does have significant weeping of the lower portion of her leg which has me concerned as far as the possibility for infection setting and at some point if we do not get this under control. Other Condition(s) Patient presents with Lymphedema located on the Right Leg. General Notes: wet and macerated spots throughout lower extremity. 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 ([id number]) Plan Secondary Dressing: ABD and Kerlix/Conform - absorptive dressing of choice Dressing Change Frequency: Change dressing every day. - and as needed Follow-up Appointments: Return Appointment in 1 week. Edema Control: Elevate legs to the level of the heart and pump ankles as often as possible Compression Pump: Use compression pump on left lower extremity for 60 minutes, twice daily. Compression Pump: Use compression pump on right lower extremity for 60 minutes, twice daily. Other: - ACE wraps The following medication(s) was prescribed: Dermacloud topical ointment ointment topical applied each day to the open and draining areas of her right Leg when the patient changes her dressings starting 12/15/2019 1. I would recommend currently that we go ahead and initiate treatment with a continuation of the absorptive dressings. I will actually send in a prescription for derma cloud for the patient. We will see how this does as a skin protectant underneath the dressings that she is  utilizing at this point. 2. If she can get to the point where she can use her lymphedema pumps that would be greatly  beneficial for her to be honest. 3. I would also recommend that the patient continue to elevate her legs is much as possible. Obviously sleeping in her bed is helpful as well as she can get her legs more elevated in that regard. We will see patient back for reevaluation in 1 week here in the clinic. If anything worsens or changes patient will contact our office for additional recommendations. Electronic Signature(s) Signed: 12/15/2019 8:47:45 AM By: Worthy Keeler PA-C Previous Signature: 12/15/2019 8:46:31 AM Version By: Worthy Keeler PA-C Entered By: Worthy Keeler on 12/15/2019 08:47:45 [place]on[place], [name] ([id number]) -------------------------------------------------------------------------------- SuperBill Details Patient Name: [name] Date of Service: 12/15/2019 Medical Record Number: [id number] Patient Account Number: [id number] Date of Birth/Sex: 1956/04/18 (64 y.o. F) Treating RN: Montey Hora Primary Care Provider: Tandy Gaw Other Clinician: Referring Provider: Tandy Gaw Treating Provider/Extender: STONE III, Kimimila Tauzin Weeks in Treatment: 2 Diagnosis Coding ICD-10 Codes Code Description Q82.0 Hereditary lymphedema L97.811 Non-pressure chronic ulcer of other part of right lower leg limited to breakdown of skin Facility Procedures CPT4 Code: [id number] Description: 99213 - WOUND CARE VISIT-LEV 3 EST PT Modifier: Quantity: 1 Physician Procedures CPT4 Code: [id number] Description: 90240 - WC PHYS LEVEL 4 - EST PT Modifier: Quantity: 1 CPT4 Code: Description: ICD-10 Diagnosis Description Q82.0 Hereditary lymphedema L97.811 Non-pressure chronic ulcer of other part of right lower leg limited to bre Modifier: akdown of skin Quantity: Electronic Signature(s) Signed: 12/15/2019 8:46:45 AM By: Worthy Keeler PA-C Entered By: Worthy Keeler on 12/15/2019 08:46:45

## 2019-12-16 NOTE — Progress Notes (Signed)
[name], [name] ([id number]) Visit Report for 12/15/2019 Arrival Information Details Patient Name: [name], [name]. Date of Service: 12/15/2019 8:00 AM Medical Record Number: [id number] Patient Account Number: [id number] Date of Birth/Sex: 29-Mar-1956 (64 y.o. F) Treating RN: Cornell Barman Primary Care Pranay Hilbun: Tandy Gaw Other Clinician: Referring Ahmari Duerson: Tandy Gaw Treating Sanford Lindblad/Extender: Melburn Hake, HOYT Weeks in Treatment: 2 Visit Information History Since Last Visit Added or deleted any medications: No Patient Arrived: Cane Any new allergies or adverse reactions: No Arrival Time: 08:12 Had a fall or experienced change in No Accompanied By: self activities of daily living that may affect Transfer Assistance: None risk of falls: Patient Identification Verified: Yes Signs or symptoms of abuse/neglect since last visito No Secondary Verification Process Completed: Yes Hospitalized since last visit: No Implantable device outside of the clinic excluding No cellular tissue based products placed in the center since last visit: Has Dressing in Place as Prescribed: Yes Pain Present Now: No Electronic Signature(s) Signed: 12/16/2019 7:21:37 AM By: Gretta Cool, BSN, RN, CWS, Kim RN, BSN Entered By: Gretta Cool, BSN, RN, CWS, Kim on 12/15/2019 08:13:02 [name] ([id number]) -------------------------------------------------------------------------------- Clinic Level of Care Assessment Details Patient Name: [name]. Date of Service: 12/15/2019 8:00 AM Medical Record Number: [id number] Patient Account Number: [id number] Date of Birth/Sex: 1955-07-26 (64 y.o. F) Treating RN: Montey Hora Primary Care Abigale Dorow: Tandy Gaw Other Clinician: Referring Keagon Glascoe: Tandy Gaw Treating Quinten Allerton/Extender: STONE III, HOYT Weeks in Treatment: 2 Clinic Level of Care Assessment Items TOOL 4 Quantity Score []  - Use when only an EandM is performed on FOLLOW-UP visit 0 ASSESSMENTS -  Nursing Assessment / Reassessment X - Reassessment of Co-morbidities (includes updates in patient status) 1 10 X- 1 5 Reassessment of Adherence to Treatment Plan ASSESSMENTS - Wound and Skin Assessment / Reassessment []  - Simple Wound Assessment / Reassessment - one wound 0 []  - 0 Complex Wound Assessment / Reassessment - multiple wounds X- 1 10 Dermatologic / Skin Assessment (not related to wound area) ASSESSMENTS - Focused Assessment X - Circumferential Edema Measurements - multi extremities 1 5 []  - 0 Nutritional Assessment / Counseling / Intervention X- 1 5 Lower Extremity Assessment (monofilament, tuning fork, pulses) []  - 0 Peripheral Arterial Disease Assessment (using hand held doppler) ASSESSMENTS - Ostomy and/or Continence Assessment and Care []  - Incontinence Assessment and Management 0 []  - 0 Ostomy Care Assessment and Management (repouching, etc.) PROCESS - Coordination of Care X - Simple Patient / Family Education for ongoing care 1 15 []  - 0 Complex (extensive) Patient / Family Education for ongoing care X- 1 10 Staff obtains Programmer, systems, Records, Test Results / Process Orders []  - 0 Staff telephones HHA, Nursing Homes / Clarify orders / etc []  - 0 Routine Transfer to another Facility (non-emergent condition) []  - 0 Routine Hospital Admission (non-emergent condition) []  - 0 New Admissions / Biomedical engineer / Ordering NPWT, Apligraf, etc. []  - 0 Emergency Hospital Admission (emergent condition) X- 1 10 Simple Discharge Coordination []  - 0 Complex (extensive) Discharge Coordination PROCESS - Special Needs []  - Pediatric / Minor Patient Management 0 []  - 0 Isolation Patient Management []  - 0 Hearing / Language / Visual special needs []  - 0 Assessment of Community assistance (transportation, D/C planning, etc.) []  - 0 Additional assistance / Altered mentation []  - 0 Support Surface(s) Assessment (bed, cushion, seat, etc.) INTERVENTIONS - Wound  Cleansing / Measurement Spoon, Brinlynn C. ([id number]) X- 1 5 Simple Wound Cleansing - one wound []  - 0 Complex Wound Cleansing -  multiple wounds X- 1 5 Wound Imaging (photographs - any number of wounds) []  - 0 Wound Tracing (instead of photographs) X- 1 5 Simple Wound Measurement - one wound []  - 0 Complex Wound Measurement - multiple wounds INTERVENTIONS - Wound Dressings []  - Small Wound Dressing one or multiple wounds 0 X- 1 15 Medium Wound Dressing one or multiple wounds []  - 0 Large Wound Dressing one or multiple wounds []  - 0 Application of Medications - topical []  - 0 Application of Medications - injection INTERVENTIONS - Miscellaneous []  - External ear exam 0 []  - 0 Specimen Collection (cultures, biopsies, blood, body fluids, etc.) []  - 0 Specimen(s) / Culture(s) sent or taken to Lab for analysis []  - 0 Patient Transfer (multiple staff / Civil Service fast streamer / Similar devices) []  - 0 Simple Staple / Suture removal (25 or less) []  - 0 Complex Staple / Suture removal (26 or more) []  - 0 Hypo / Hyperglycemic Management (close monitor of Blood Glucose) []  - 0 Ankle / Brachial Index (ABI) - do not check if billed separately X- 1 5 Vital Signs Has the patient been seen at the hospital within the last three years: Yes Total Score: 105 Level Of Care: New/Established - Level 3 Electronic Signature(s) Signed: 12/15/2019 2:05:26 PM By: Montey Hora Entered By: Montey Hora on 12/15/2019 08:40:31 [name], [name] ([id number]) -------------------------------------------------------------------------------- Encounter Discharge Information Details Patient Name: [name]. Date of Service: 12/15/2019 8:00 AM Medical Record Number: [id number] Patient Account Number: [id number] Date of Birth/Sex: [birth year] (64 y.o. F) Treating RN: Montey Hora Primary Care Harvin Konicek: Tandy Gaw Other Clinician: Referring Ramonita Koenig: Tandy Gaw Treating Cathe Bilger/Extender: Melburn Hake,  HOYT Weeks in Treatment: 2 Encounter Discharge Information Items Discharge Condition: Stable Ambulatory Status: Cane Discharge Destination: Home Transportation: Private Auto Accompanied By: self Schedule Follow-up Appointment: Yes Clinical Summary of Care: Electronic Signature(s) Signed: 12/15/2019 2:05:26 PM By: Montey Hora Entered By: Montey Hora on 12/15/2019 08:43:11 [name], [name] ([id number]) -------------------------------------------------------------------------------- Lower Extremity Assessment Details Patient Name: [name]. Date of Service: 12/15/2019 8:00 AM Medical Record Number: [id number] Patient Account Number: [id number] Date of Birth/Sex: [birth year] (64 y.o. F) Treating RN: Cornell Barman Primary Care Shawntee Mainwaring: Tandy Gaw Other Clinician: Referring Kayin Osment: Tandy Gaw Treating Kadeisha Betsch/Extender: STONE III, HOYT Weeks in Treatment: 2 Edema Assessment Assessed: [Left: No] [Right: No] [Left: Edema] [Right: :] Calf Left: Right: Point of Measurement: 34 cm From Medial Instep cm 102 cm Ankle Left: Right: Point of Measurement: 13 cm From Medial Instep cm 62 cm Vascular Assessment Pulses: Dorsalis Pedis Palpable: [Right:Yes] Electronic Signature(s) Signed: 12/16/2019 7:21:37 AM By: Gretta Cool, BSN, RN, CWS, Kim RN, BSN Entered By: Gretta Cool, BSN, RN, CWS, Kim on 12/15/2019 08:21:08 [name] ([id number]) -------------------------------------------------------------------------------- Multi Wound Chart Details Patient Name: [name]. Date of Service: 12/15/2019 8:00 AM Medical Record Number: [id number] Patient Account Number: [id number] Date of Birth/Sex: Oct 20, 1955 (64 y.o. F) Treating RN: Montey Hora Primary Care Dilara Navarrete: Tandy Gaw Other Clinician: Referring Brienne Liguori: Tandy Gaw Treating Afshin Chrystal/Extender: STONE III, HOYT Weeks in Treatment: 2 Vital Signs Height(in): Pulse(bpm): 75 Weight(lbs): Blood Pressure(mmHg):  138/72 Body Mass Index(BMI): Temperature(F): 98.2 Respiratory Rate(breaths/min): 16 Wound Assessments Treatment Notes Electronic Signature(s) Signed: 12/15/2019 2:05:26 PM By: Montey Hora Entered By: Montey Hora on 12/15/2019 08:31:26 [name] ([id number]) -------------------------------------------------------------------------------- [place] Details Patient Name: [name]. Date of Service: 12/15/2019 8:00 AM Medical Record Number: [id number] Patient Account Number: [id number] Date of Birth/Sex: [birth year] (64 y.o. F) Treating RN: Montey Hora Primary Care Jalana Moore:  MARKLY, LINDA Other Clinician: Referring Ariely Riddell: Sharrell Ku, LINDA Treating Millena Callins/Extender: STONE III, HOYT Weeks in Treatment: 2 Active Inactive Abuse / Safety / Falls / Self Care Management Nursing Diagnoses: Potential for falls Goals: Patient will remain injury free related to falls Date Initiated: 11/27/2019 Target Resolution Date: 02/13/2020 Goal Status: Active Interventions: Assess fall risk on admission and as needed Notes: Orientation to the Wound Care Program Nursing Diagnoses: Knowledge deficit related to the wound healing center program Goals: Patient/caregiver will verbalize understanding of the [place] Program Date Initiated: 11/27/2019 Target Resolution Date: 02/13/2020 Goal Status: Active Interventions: Provide education on orientation to the wound center Notes: Venous Leg Ulcer Nursing Diagnoses: Potential for venous Insuffiency (use before diagnosis confirmed) Goals: Patient will maintain optimal edema control Date Initiated: 11/27/2019 Target Resolution Date: 02/13/2020 Goal Status: Active Interventions: Compression as ordered Notes: Electronic Signature(s) Signed: 12/15/2019 2:05:26 PM By: Montey Hora Entered By: Montey Hora on 12/15/2019 08:31:19 [name], [name]  ([id number]) -------------------------------------------------------------------------------- Non-Wound Condition Assessment Details Patient Name: [name]. Date of Service: 12/15/2019 8:00 AM Medical Record Number: [id number] Patient Account Number: [id number] Date of Birth/Sex: 1956-05-30 (64 y.o. F) Treating RN: Cornell Barman Primary Care Dwayna Kentner: Tandy Gaw Other Clinician: Referring Celeste Candelas: Tandy Gaw Treating Kelsey Durflinger/Extender: STONE III, HOYT Weeks in Treatment: 2 Non-Wound Condition: Condition: Lymphedema Location: Leg Side: Right Notes wet and macerated spots throughout lower extremity. Electronic Signature(s) Signed: 12/16/2019 7:21:37 AM By: Gretta Cool, BSN, RN, CWS, Kim RN, BSN Entered By: Gretta Cool, BSN, RN, CWS, Kim on 12/15/2019 03:54:65 [name] ([id number]) -------------------------------------------------------------------------------- Pain Assessment Details Patient Name: [name], [name]. Date of Service: 12/15/2019 8:00 AM Medical Record Number: [id number] Patient Account Number: [id number] Date of Birth/Sex: 08/13/1955 (64 y.o. F) Treating RN: Cornell Barman Primary Care Yvan Dority: Tandy Gaw Other Clinician: Referring Kristinia Leavy: Tandy Gaw Treating Soren Lazarz/Extender: STONE III, HOYT Weeks in Treatment: 2 Active Problems Location of Pain Severity and Description of Pain Patient Has Paino Yes Site Locations Pain Location: Generalized Pain Pain Management and Medication Current Pain Management: Notes Muscle Pain Electronic Signature(s) Signed: 12/16/2019 7:21:37 AM By: Gretta Cool, BSN, RN, CWS, Kim RN, BSN Entered By: Gretta Cool, BSN, RN, CWS, Kim on 12/15/2019 08:14:37 [name] ([id number]) -------------------------------------------------------------------------------- Patient/Caregiver Education Details Patient Name: [name] Date of Service: 12/15/2019 8:00 AM Medical Record Number: [id number] Patient Account Number: [id number] Date of  Birth/Gender: 1956-07-07 (64 y.o. F) Treating RN: Montey Hora Primary Care Physician: Tandy Gaw Other Clinician: Referring Physician: Tandy Gaw Treating Physician/Extender: Melburn Hake, HOYT Weeks in Treatment: 2 Education Assessment Education Provided To: Patient Education Topics Provided Wound/Skin Impairment: Handouts: Other: fluid management Methods: Explain/Verbal Responses: State content correctly Electronic Signature(s) Signed: 12/15/2019 2:05:26 PM By: Montey Hora Entered By: Montey Hora on 12/15/2019 [name], [name] ([id number]) -------------------------------------------------------------------------------- Vitals Details Patient Name: [name]. Date of Service: 12/15/2019 8:00 AM Medical Record Number: [id number] Patient Account Number: [id number] Date of Birth/Sex: 1955-12-26 (64 y.o. F) Treating RN: Cornell Barman Primary Care Clinten Howk: Tandy Gaw Other Clinician: Referring Giani Betzold: Tandy Gaw Treating Angas Isabell/Extender: STONE III, HOYT Weeks in Treatment: 2 Vital Signs Time Taken: 08:13 Temperature (F): 98.2 Pulse (bpm): 75 Respiratory Rate (breaths/min): 16 Blood Pressure (mmHg): 138/72 Reference Range: 80 - 120 mg / dl Electronic Signature(s) Signed: 12/16/2019 7:21:37 AM By: Gretta Cool, BSN, RN, CWS, Kim RN, BSN Entered By: Gretta Cool, BSN, RN, CWS, Kim on 12/15/2019 08:14:10

## 2019-12-22 ENCOUNTER — Encounter: Payer: Medicaid Other | Admitting: Physician Assistant

## 2019-12-22 ENCOUNTER — Other Ambulatory Visit: Payer: Self-pay

## 2019-12-22 DIAGNOSIS — E11622 Type 2 diabetes mellitus with other skin ulcer: Secondary | ICD-10-CM | POA: Diagnosis not present

## 2019-12-22 NOTE — Progress Notes (Addendum)
EVER, HALBERG ([id number]) Visit Report for 12/22/2019 Chief Complaint Document Details Patient Name: [name], [name]. Date of Service: 12/22/2019 9:00 AM Medical Record Number: [id number] Patient Account Number: [id number] Date of Birth/Sex: [birth year] (64 y.o. F) Treating RN: Montey Hora Primary Care Provider: Tandy Gaw Other Clinician: Referring Provider: Tandy Gaw Treating Provider/Extender: Melburn Hake, Mandisa Persinger Weeks in Treatment: 3 Information Obtained from: Patient Chief Complaint Bilateral LE lymphedema Electronic Signature(s) Signed: 12/22/2019 6:01:22 PM By: Worthy Keeler PA-C Entered By: Worthy Keeler on 12/22/2019 18:01:22 Bovina, [name] ([id number]) -------------------------------------------------------------------------------- HPI Details Patient Name: [name]. Date of Service: 12/22/2019 9:00 AM Medical Record Number: [id number] Patient Account Number: [id number] Date of Birth/Sex: [birth year] (64 y.o. F) Treating RN: Montey Hora Primary Care Provider: Tandy Gaw Other Clinician: Referring Provider: Tandy Gaw Treating Provider/Extender: STONE III, Violette Morneault Weeks in Treatment: 3 History of Present Illness HPI Description: The patient is a 64 year old female with history of hypertension and a long-standing history of bilateral lower extremity lymphedema (first presented on 4/2) . She has had open ulcers in the past which have always responded to compression therapy. She had briefly been to a lymphedema clinic in the past which helped her at the time. this time around she stopped treatment of her lymphedema pumps approximately 2 weeks ago because of some pain in the knees and then noticed the right leg getting worse. She was seen by her PCP who put her on clindamycin 4 times a day 2 days ago. The patient has seen [facility] and Dr. Delana Meyer had seen her last year where a vascular study including venous and arterial duplex studies were within normal  limits. he had recommended compression stockings and lymphedema pumps and the patient has been using this in about 2 weeks ago. She is known to be diabetic but in the past few time she's gone to her primary care doctor her hemoglobin A1c has been normal. 02/11/2015 - after her last visit she took my advice and went to the ER regarding the progressive cellulitis of her right lower extremity and she was admitted between July 17 and 22nd. She received IV antibiotics and then was sent home on a course of steroid-induced and oral antibiotics. She has improved much since then. 02/17/2015 -- she has been doing fine and the weeping of her legs has remarkably gone down. She has no fresh issues. READMISSION 01/15/18 This patient was given this clinic before most recently in 2016 seen by Dr. Con Memos. She has massive bilateral lymphedema and over the last 2 months this had weeping edema out of the left leg. She has compression pumps but her compliance with these has been minimal. She has [facility] they've been using TCA/ABDs/kerlix under an Ace wrap.she has had recent problems with cellulitis. She was apparently seen in the ER and 12/23/17 and given clindamycin. She was then followed by her primary doctor and given doxycycline and Keflex. The pain seems to have settled down. In April 2018 the patient had arterial studies done at [facility]. This showed triphasic waveforms throughout the right leg and mostly triphasic waveforms on the left except for monophasic at the posterior tibial artery distally. She was not felt to have evidence of right lower extremity arterial stenosis or significant problems on the left side. 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 She also had a right lower extremity venous Doppler in January 2018 this was limited by the patient's body habitus and lymphedema. Most of the  proximal veins were not visualized The patient presents with an  area of denuded skin on the anterior medial part of the left calf. There is weeping edema fluid here. 01/22/18; the patient has somewhat better edema control using her compression pumps twice a day and as a result she has much better epithelialization on the left anterior calf area. Only a small open area remains. 01/29/18; the patient has been compliant with her compression pumps. Both the areas on her calf that healed. The remaining area on the left anterior leg is fully epithelialized Readmission: 02/20/2019 upon evaluation today patient presents for reevaluation due to issues that she is having with the bilateral lower extremities. She actually has wounds open on both legs. On the right she has an area in the crease of her leg on the right around the knee region which is actually draining quite a bit and actually has some fungal type appearance to it. She has been on nystatin powder that seems to have helped to some degree. In regard to the left lower extremity this is actually in the lower portion of her leg closer to the ankle and again is continuing to drain as well unfortunately. There does not appear to be any signs of active infection at this time which is good news. No fevers, chills, nausea, vomiting, or diarrhea. She tells me that since she was seen last year she is actually been doing quite well for the most part with regard to her lower extremities. Unfortunately she now is experiencing a little bit more drainage at this time. She is concerned about getting this under control so that it does not get significantly worse. 02/27/2019 on evaluation today patient appears to be doing somewhat better in regard to her bilateral lower extremity wounds. She has been tolerating the dressing changes without complication. Fortunately there is no signs of active infection at this point. No fevers, chills, nausea, vomiting, or diarrhea. She did get her dressing supplies which is excellent news she was  extremely excited to get these. She also got paperwork from [employer] for their financial assistance program where they may be able to help her out in the future if needed with supplies at discounted prices. 03/06/2019 on evaluation today patient appears to be doing a little worse with regard to both areas of weeping on her bilateral lower extremities. This is around the right medial knee and just above the left ankle. With that being said she is unfortunately not doing as well as I would like to see. I feel like she may need to potentially go see someone at the lymphedema clinic as the wraps that she needs or even beyond what we can do here at the wound care center. She really does not have wounds she just has open areas of weeping that are causing some difficulty for her. Subsequently because of this and the moisture I am concerned about the potential for infection I am going to likely give her a prophylactic antibiotic today, Keflex, just to be on the safe side. Nonetheless again there is no obvious signs of active infection at this time. 03/13/2019 on evaluation today patient appears to be doing well with regard to her bilateral lower extremities where she has been weeping compared to even last week's evaluation. I see some areas of new skin growth which is excellent and overall I am very pleased with how things seem to be progressing. No fevers, chills, nausea, vomiting, or diarrhea. [name], [name] ([id number]) 03/20/2019 on evaluation  today patient unfortunately is continuing to have issues with significant edema of the left lower extremity. Her right side seems to be doing much better. Unfortunately her left side is showing increased weeping of the lower portion of her leg. This is quite unfortunate obviously we were hoping to get her into the lymphedema clinic they really do not seem to when I see her how if she is draining. Despite the fact this is really not wound related but more lymphedema  weeping related. Nonetheless I do not know that this can be helpful for her to even go for that appointment since again I am not sure there is much that they would actually do at this point. We may need to try a 4 layer compression wrap as best we can on her leg. She is on the Augmentin currently although I am still concerned about whether or not there could be potentially something going on infection wise I would obtain a culture though I understand is not the best being that is a surface culture I just 1 to make sure I do not seem to be missing anything. 03/27/2019 on evaluation today patient appears to be doing much better in regard to the left lower extremity compared to last week. Last week she had tremendous weeping which I think was subsequent to infection now she seems to be doing much better and very pleased. This is not completely healed but there is a lot of new skin growth and it has dried out quite a bit. Overall I think that we are doing well with how things are moving along at this time. No fevers, chills, nausea, vomiting, or diarrhea. 04/03/2019 evaluation today patient appears to be doing a little worse this week compared to last time I saw her. I think this may be due to the fact that she is having issues with not being able to sleep in her bed at least not until last night. She is therefore been in a lift chair and subsequently has also had issues with not been able to use her pumps since she could not get in bed. With that being said the patient overall seems to be doing okay I do think I may want extend the antibiotic for a little bit longer at least until we can see if her edema and her weeping gets better and if it is then obviously I can always discontinue the antibiotics as of next week however I want her to continue to have it over the next week. 04/10/2019 on evaluation today patient unfortunately is still doing poorly with regard to her left lower extremity. Her right is all  things considering doing fairly well. On the left however she continues to have spreading of the area of infection and weeping which appears to be even a larger surface area than noted last week. She did have a positive culture for Pseudomonas in particular which seems to have been of concern she still has green/yellow discharge consistent with Pseudomonas and subsequently a tremendous amount of it. This has me obviously still concerned about the infection not really clearing up despite the fact that on culture it appears the Cipro should have been a good option for treating this. I think she may at this point need IV antibiotics since things are not doing better I do not want to get worse and cause sepsis. She is in agreement with the plan and believes as well that she likely does need to go to the hospital for  IV vancomycin. Or something of the like depending on what the recommendation is from the ER. 04/17/2019 on evaluation today patient appears to be doing excellent in regard to her lower extremity on the left. She was in the hospital for several days from when I sent her last we saw her until just this past Tuesday. Fortunately her drainage is significantly improved and in fact is mostly clear. There is just a couple small areas that may still drain a little bit she states that the [facility] they prescribed for her at discharge she went picked up from pharmacy and got home but has not been able to find it since. She is looked everywhere. She is wondering if I will replace that for her today I will be more than happy to do that. 05/01/2019 on evaluation today patient actually appears to be doing quite well with regard to her lower extremities. She occasionally is having areas that will leak and then heal up mainly when a piece of the fibrotic skin pops off but fortunately she is not having any signs of active infection at this time. Overall she also really does not have any obvious weeping at this  time. I do believe however she really needs some compression wraps and I think this may be a good time to get her back to the lymphedema clinic. 05/11/2019 on evaluation today patient actually appears to be doing quite well with regard to her bilateral lower extremities. She occasionally will have a small area that we per another but in general seems to be completely healed which is great news. Overall very pleased with how everything seems to be progressing. She does have her appointment with lymphedema clinic on November 18. 05/25/2019 on evaluation today patient appears to be doing well with regard to her left lower extremity. I am very pleased in this regard. In regard to her right leg this actually did start draining more I think it is mainly due to the fact that her leg is more swollen. I am not seeing any obvious signs of infection at this time although that is definitely something were obviously acutely aware of simply due to the fact that she had an issue not too far back with exactly this issue. Nonetheless I do feel like that lymphedema clinic would still be beneficial for her. I explained obviously if they are not able to do anything treatment wise on the right leg we could at least have them treat her left leg and then proceed from there. The patient is really in agreement with that plan. If they are able to do both as the drainage slows down that I would be happy to let them handle both. 06/01/2019 on evaluation today patient unfortunately appears to be doing worse with regard to her right lower extremity. The left lower extremity is still maintaining at this point. Unfortunately she has been having significantly increased pain over the past several days and has been experiencing as well increased swelling of the right lower extremity. I really do not know that I am seeing anything that appears to be obvious for infection at this point to be peripherally honest. With that being said the  patient does seem to be having much more swelling that she is even experienced in the past and coupled with increased pain in her hip as well I am concerned that again she could potentially have a DVT although I am not 100% sure of this. I think it something that may need to  be checked out. We discussed the possibility of sending her for a DVT study through the hospital but unfortunately transportation is an issue if she does have a DVT I do not want her to wait days to be able to get in for that test however if she has this scheduled as an outpatient that is as fast that she will be able to get the test scheduled for transportation purposes. That will also fall on Thanksgiving so subsequently she did actually be looking at either Friday or even next week before we would know anything back from this. That is much too long in my opinion. Subsequent to the amount of discomfort she is experiencing the patient is actually okay with going to the ER for evaluation today. 06/12/2019 on evaluation today patient actually appears to be doing significantly better compared to last time I saw her. Following when I last saw her she was actually in the hospital from that Monday until the following Sunday almost 1 full week. She actually was placed on Keflex in the hospital following the time for her to be discharged and Dr. Steva Ready has recommended 2 times a day dosing of the Keflex for the next year in order to help with more prophylactic/preventative measures with regard to her developing cellulitis. Overall I think this sounds like an excellent plan. The patient unfortunately is good to have trouble being treated at lymphedema clinic due to the fact that she really cannot get up on the bed that they have there. They also state that they cannot manage her as long as she has anything draining at this point. Obviously that is somewhat unfortunate as she does need help with edema control but nonetheless we will have  to do what we can for her outside of it sounds like the lymphedema clinic scenario at this point. 06/19/2019 on evaluation today patient appears to be doing fairly well with regard to her bilateral lower extremities. She is not nearly as swollen and shows no signs of infection at this point. There is no evidence of cellulitis whatsoever. She also has no open wounds or draining at this point which is also good news. No fever chills noted. She seems to be in very good spirits and in fact appears to be doing quite well. READMISSION 11/27/2019 [name], [name] ([id number]) This is a 64 year old woman that we have had in this clinic several times before including 2015, 16 and 19 and then most recently from 03/20/2019 through 06/19/2019 with bilateral lower extremity lymphedema. She has had previous arterial and reflux studies done years ago which were not all that remarkable. 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 She does have a positive family history and she had large legs starting may be in her 75s. She was recently in hospital from 10/20/2019 through 10/28/2019 with right leg cellulitis. She was given Ancef and clindamycin and then Zosyn when a culture showed Pseudomonas. At that time there was purulent drainage. She was followed by infectious disease Dr Steva Ready. The patient is now back at home. She has noted increased swelling in the right and no drainage in her right leg mostly on the posterior medial aspect in the calf area. She has not had pain or fever. She has literally been improved lysing above dressings because her at the area of this is far too large for standard compression. She has been wrapping the areas with sheets to resorptive pads. She is found these helped  somewhat. She does have an appointment with the lymphedema clinic in [place] in late June. Past medical history includes bilateral lymphedema, hypertension,  obstructive sleep apnea with CPAP. Recent hospitalization with apparently Pseudomonas cellulitis of the right lower leg 12/15/2019 upon evaluation today patient appears to be doing a little bit worse in regard to her right lower extremity. Unfortunately she is having more weeping down in the lower portion of her leg. Fortunately there is no signs of active infection at this time. No fever chills noted. The patient states she is not having increased pain except for when she attempted to use the lymphedema pumps unfortunately she states that she did have pain when she did this. Otherwise we been using absorptive dressings of one type or another she is using diapers at home and then subsequently Ace wraps. In regard to the barrier cream we have discussed the possibility of derma cloud which she would like to try I do not have a problem with that. 12/22/2019 upon evaluation today patient actually appears to be doing better in regard to her leg ulcers at this point. Fortunately there does not appear to be any signs of active infection which is great news and I am extremely pleased with where things are progressing at this time. There is no sign of active infection currently. The patient is very pleased to see things doing so well. Electronic Signature(s) Signed: 12/22/2019 6:01:51 PM By: Worthy Keeler PA-C Entered By: Worthy Keeler on 12/22/2019 18:01:51 Rhinehart, [name] ([id number]) -------------------------------------------------------------------------------- Physical Exam Details Patient Name: SHEVELLE, SMITHER C. Date of Service: 12/22/2019 9:00 AM Medical Record Number: [id number] Patient Account Number: [id number] Date of Birth/Sex: [birth year] (64 y.o. F) Treating RN: Montey Hora Primary Care Provider: Tandy Gaw Other Clinician: Referring Provider: Tandy Gaw Treating Provider/Extender: STONE III, Tokiko Diefenderfer Weeks in Treatment: 3 Constitutional Obese and well-hydrated in no acute  distress. Respiratory normal breathing without difficulty. Psychiatric this patient is able to make decisions and demonstrates good insight into disease process. Alert and Oriented x 3. pleasant and cooperative. Notes Patient's wound bed currently showed signs of good granulation at this time and epithelization. There does not appear to be any evidence of active infection which is great news. Overall extremely pleased with where we stand today. Electronic Signature(s) Signed: 12/22/2019 6:02:24 PM By: Worthy Keeler PA-C Entered By: Worthy Keeler on 12/22/2019 18:02:24 [name] ([id number]) -------------------------------------------------------------------------------- Physician Orders Details Patient Name: [name] Date of Service: 12/22/2019 9:00 AM Medical Record Number: [id number] Patient Account Number: [id number] Date of Birth/Sex: 1955/09/29 (64 y.o. 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 - and as needed Follow-up Appointments o Return Appointment in 1 week. Edema Control o Elevate legs to the level of the heart and pump ankles as often as possible o Compression Pump: Use compression pump on left lower extremity for 60 minutes, twice daily. o Compression Pump: Use compression pump on right lower extremity for 60 minutes, twice daily. o Other: - ACE wraps Electronic Signature(s) Signed: 12/22/2019 4:29:48 PM By: Montey Hora Signed: 12/22/2019 6:10:04 PM By: Worthy Keeler PA-C  Entered By: Montey Hora on 12/22/2019 09:07:29 [name] ([id number]) -------------------------------------------------------------------------------- Problem List Details Patient Name: [name]. Date of Service: 12/22/2019 9:00 AM Medical Record Number: [id number] Patient Account Number: [id number] Date of Birth/Sex: Dec 09, 1955 (64 y.o. 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 Date of Service: 12/22/2019 9:00 AM Medical Record Number: [id number] Patient Account Number: [id number] Date of Birth/Sex: [birth year] (64 y.o. F) Treating RN: Montey Hora Primary Care Provider: Tandy Gaw Other Clinician: Referring Provider: Tandy Gaw Treating Provider/Extender: STONE III, Icelyn Navarrete Weeks in Treatment: 3 Subjective Chief Complaint Information obtained from Patient Bilateral LE lymphedema History of Present Illness (HPI) The patient is a 64 year old female with history of hypertension and a long-standing history of bilateral lower extremity lymphedema (first presented on 4/2) . She has had open ulcers in the past which have always responded to compression therapy. She had briefly been to a lymphedema clinic in the past which helped her at the time. this time  around she stopped treatment of her lymphedema pumps approximately 2 weeks ago because of some pain in the knees and then noticed the right leg getting worse. She was seen by her PCP who put her on clindamycin 4 times a day 2 days ago. The patient has seen [facility] and Dr. Delana Meyer had seen her last year where a vascular study including venous and arterial duplex studies were within normal limits. he had recommended compression stockings and lymphedema pumps and the patient has been using this in about 2 weeks ago. She is known to be diabetic but in the past few time she's gone to her primary care doctor her hemoglobin A1c has been normal. 02/11/2015 - after her last visit she took my advice and went to the ER regarding the progressive cellulitis of her right lower extremity and she was admitted between July 17 and 22nd. She received IV antibiotics and then was sent home on a course of steroid-induced and oral antibiotics. She has improved much since then. 02/17/2015 -- she has been doing fine and the weeping of her legs has remarkably gone down. She has no fresh issues. READMISSION 01/15/18 This patient was given this clinic before most recently in 2016 seen by Dr. Con Memos. She has massive bilateral lymphedema and over the last 2 months this had weeping edema out of the left leg. She has compression pumps but her compliance with these has been minimal. She has [facility] they've been using TCA/ABDs/kerlix under an Ace wrap.she has had recent problems with cellulitis. She was apparently seen in the ER and 12/23/17 and given clindamycin. She was then followed by her primary doctor and given doxycycline and Keflex. The pain seems to have settled down. In April 2018 the patient had arterial studies done at [facility]. This showed triphasic waveforms throughout the right leg and mostly triphasic waveforms on the left except for monophasic at the posterior tibial artery distally. She was  not felt to have evidence of right lower extremity arterial  stenosis or significant problems on the left side. 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 She also had a right lower extremity venous Doppler in January 2018 this was limited by the patient's body habitus and lymphedema. Most of the proximal veins were not visualized The patient presents with an area of denuded skin on the anterior medial part of the left calf. There is weeping edema fluid here. 01/22/18; the patient has somewhat better edema control using her compression pumps twice a day and as a result she has much better epithelialization on the left anterior calf area. Only a small open area remains. 01/29/18; the patient has been compliant with her compression pumps. Both the areas on her calf that healed. The remaining area on the left anterior leg is fully epithelialized Readmission: 02/20/2019 upon evaluation today patient presents for reevaluation due to issues that she is having with the bilateral lower extremities. She actually has wounds open on both legs. On the right she has an area in the crease of her leg on the right around the knee region which is actually draining quite a bit and actually has some fungal type appearance to it. She has been on nystatin powder that seems to have helped to some degree. In regard to the left lower extremity this is actually in the lower portion of her leg closer to the ankle and again is continuing to drain as well unfortunately. There does not appear to be any signs of active infection at this time which is good news. No fevers, chills, nausea, vomiting, or diarrhea. She tells me that since she was seen last year she is actually been doing quite well for the most part with regard to her lower extremities. Unfortunately she now is experiencing a little bit more drainage at this time. She is concerned about getting this under control so that it does not get  significantly worse. 02/27/2019 on evaluation today patient appears to be doing somewhat better in regard to her bilateral lower extremity wounds. She has been tolerating the dressing changes without complication. Fortunately there is no signs of active infection at this point. No fevers, chills, nausea, vomiting, or diarrhea. She did get her dressing supplies which is excellent news she was extremely excited to get these. She also got paperwork from [employer] for their financial assistance program where they may be able to help her out in the future if needed with supplies at discounted prices. 03/06/2019 on evaluation today patient appears to be doing a little worse with regard to both areas of weeping on her bilateral lower extremities. This is around the right medial knee and just above the left ankle. With that being said she is unfortunately not doing as well as I would like to see. I feel like she may need to potentially go see someone at the lymphedema clinic as the wraps that she needs or even beyond what we can do here at the wound care center. She really does not have wounds she just has open areas of weeping that are causing some difficulty for her. Subsequently because of this and the moisture I am concerned about the potential for infection I am going to likely give her a prophylactic antibiotic today, Keflex, just to be on the safe side. Nonetheless again there is no obvious signs of active infection at this time. [name], [name] ([id number]) 03/13/2019 on evaluation today patient appears to be doing well with regard to her bilateral lower extremities where she  has been weeping compared to even last week's evaluation. I see some areas of new skin growth which is excellent and overall I am very pleased with how things seem to be progressing. No fevers, chills, nausea, vomiting, or diarrhea. 03/20/2019 on evaluation today patient unfortunately is continuing to have issues with significant edema  of the left lower extremity. Her right side seems to be doing much better. Unfortunately her left side is showing increased weeping of the lower portion of her leg. This is quite unfortunate obviously we were hoping to get her into the lymphedema clinic they really do not seem to when I see her how if she is draining. Despite the fact this is really not wound related but more lymphedema weeping related. Nonetheless I do not know that this can be helpful for her to even go for that appointment since again I am not sure there is much that they would actually do at this point. We may need to try a 4 layer compression wrap as best we can on her leg. She is on the Augmentin currently although I am still concerned about whether or not there could be potentially something going on infection wise I would obtain a culture though I understand is not the best being that is a surface culture I just 1 to make sure I do not seem to be missing anything. 03/27/2019 on evaluation today patient appears to be doing much better in regard to the left lower extremity compared to last week. Last week she had tremendous weeping which I think was subsequent to infection now she seems to be doing much better and very pleased. This is not completely healed but there is a lot of new skin growth and it has dried out quite a bit. Overall I think that we are doing well with how things are moving along at this time. No fevers, chills, nausea, vomiting, or diarrhea. 04/03/2019 evaluation today patient appears to be doing a little worse this week compared to last time I saw her. I think this may be due to the fact that she is having issues with not being able to sleep in her bed at least not until last night. She is therefore been in a lift chair and subsequently has also had issues with not been able to use her pumps since she could not get in bed. With that being said the patient overall seems to be doing okay I do think I may want  extend the antibiotic for a little bit longer at least until we can see if her edema and her weeping gets better and if it is then obviously I can always discontinue the antibiotics as of next week however I want her to continue to have it over the next week. 04/10/2019 on evaluation today patient unfortunately is still doing poorly with regard to her left lower extremity. Her right is all things considering doing fairly well. On the left however she continues to have spreading of the area of infection and weeping which appears to be even a larger surface area than noted last week. She did have a positive culture for Pseudomonas in particular which seems to have been of concern she still has green/yellow discharge consistent with Pseudomonas and subsequently a tremendous amount of it. This has me obviously still concerned about the infection not really clearing up despite the fact that on culture it appears the Cipro should have been a good option for treating this. I think she may at  this point need IV antibiotics since things are not doing better I do not want to get worse and cause sepsis. She is in agreement with the plan and believes as well that she likely does need to go to the hospital for IV vancomycin. Or something of the like depending on what the recommendation is from the ER. 04/17/2019 on evaluation today patient appears to be doing excellent in regard to her lower extremity on the left. She was in the hospital for several days from when I sent her last we saw her until just this past Tuesday. Fortunately her drainage is significantly improved and in fact is mostly clear. There is just a couple small areas that may still drain a little bit she states that the [facility] they prescribed for her at discharge she went picked up from pharmacy and got home but has not been able to find it since. She is looked everywhere. She is wondering if I will replace that for her today I will be more than  happy to do that. 05/01/2019 on evaluation today patient actually appears to be doing quite well with regard to her lower extremities. She occasionally is having areas that will leak and then heal up mainly when a piece of the fibrotic skin pops off but fortunately she is not having any signs of active infection at this time. Overall she also really does not have any obvious weeping at this time. I do believe however she really needs some compression wraps and I think this may be a good time to get her back to the lymphedema clinic. 05/11/2019 on evaluation today patient actually appears to be doing quite well with regard to her bilateral lower extremities. She occasionally will have a small area that we per another but in general seems to be completely healed which is great news. Overall very pleased with how everything seems to be progressing. She does have her appointment with lymphedema clinic on November 18. 05/25/2019 on evaluation today patient appears to be doing well with regard to her left lower extremity. I am very pleased in this regard. In regard to her right leg this actually did start draining more I think it is mainly due to the fact that her leg is more swollen. I am not seeing any obvious signs of infection at this time although that is definitely something were obviously acutely aware of simply due to the fact that she had an issue not too far back with exactly this issue. Nonetheless I do feel like that lymphedema clinic would still be beneficial for her. I explained obviously if they are not able to do anything treatment wise on the right leg we could at least have them treat her left leg and then proceed from there. The patient is really in agreement with that plan. If they are able to do both as the drainage slows down that I would be happy to let them handle both. 06/01/2019 on evaluation today patient unfortunately appears to be doing worse with regard to her right lower  extremity. The left lower extremity is still maintaining at this point. Unfortunately she has been having significantly increased pain over the past several days and has been experiencing as well increased swelling of the right lower extremity. I really do not know that I am seeing anything that appears to be obvious for infection at this point to be peripherally honest. With that being said the patient does seem to be having much more swelling that she  is even experienced in the past and coupled with increased pain in her hip as well I am concerned that again she could potentially have a DVT although I am not 100% sure of this. I think it something that may need to be checked out. We discussed the possibility of sending her for a DVT study through the hospital but unfortunately transportation is an issue if she does have a DVT I do not want her to wait days to be able to get in for that test however if she has this scheduled as an outpatient that is as fast that she will be able to get the test scheduled for transportation purposes. That will also fall on Thanksgiving so subsequently she did actually be looking at either Friday or even next week before we would know anything back from this. That is much too long in my opinion. Subsequent to the amount of discomfort she is experiencing the patient is actually okay with going to the ER for evaluation today. 06/12/2019 on evaluation today patient actually appears to be doing significantly better compared to last time I saw her. Following when I last saw her she was actually in the hospital from that Monday until the following Sunday almost 1 full week. She actually was placed on Keflex in the hospital following the time for her to be discharged and Dr. Steva Ready has recommended 2 times a day dosing of the Keflex for the next year in order to help with more prophylactic/preventative measures with regard to her developing cellulitis. Overall I think this  sounds like an excellent plan. The patient unfortunately is good to have trouble being treated at lymphedema clinic due to the fact that she really cannot get up on the bed that they have there. They also state that they cannot manage her as long as she has anything draining at this point. Obviously that is somewhat unfortunate as she does need help with edema control but nonetheless we will have to do what we can for her outside of it sounds like the lymphedema clinic scenario at this point. 06/19/2019 on evaluation today patient appears to be doing fairly well with regard to her bilateral lower extremities. She is not nearly as swollen [name], [name] ([id number]) and shows no signs of infection at this point. There is no evidence of cellulitis whatsoever. She also has no open wounds or draining at this point which is also good news. No fever chills noted. She seems to be in very good spirits and in fact appears to be doing quite well. READMISSION 11/27/2019 This is a 64 year old woman that we have had in this clinic several times before including 2015, 16 and 19 and then most recently from 03/20/2019 through 06/19/2019 with bilateral lower extremity lymphedema. She has had previous arterial and reflux studies done years ago which were not all that remarkable. 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 She does have a positive family history and she had large legs starting may be in her 87s. She was recently in hospital from 10/20/2019 through 10/28/2019 with right leg cellulitis. She was given Ancef and clindamycin and then Zosyn when a culture showed Pseudomonas. At that time there was purulent drainage. She was followed by infectious disease Dr Steva Ready. The patient is now back at home. She has noted increased swelling in the right and no drainage in her right leg mostly on the posterior medial aspect in the calf area. She has  not had pain  or fever. She has literally been improved lysing above dressings because her at the area of this is far too large for standard compression. She has been wrapping the areas with sheets to resorptive pads. She is found these helped somewhat. She does have an appointment with the lymphedema clinic in [place] in late June. Past medical history includes bilateral lymphedema, hypertension, obstructive sleep apnea with CPAP. Recent hospitalization with apparently Pseudomonas cellulitis of the right lower leg 12/15/2019 upon evaluation today patient appears to be doing a little bit worse in regard to her right lower extremity. Unfortunately she is having more weeping down in the lower portion of her leg. Fortunately there is no signs of active infection at this time. No fever chills noted. The patient states she is not having increased pain except for when she attempted to use the lymphedema pumps unfortunately she states that she did have pain when she did this. Otherwise we been using absorptive dressings of one type or another she is using diapers at home and then subsequently Ace wraps. In regard to the barrier cream we have discussed the possibility of derma cloud which she would like to try I do not have a problem with that. 12/22/2019 upon evaluation today patient actually appears to be doing better in regard to her leg ulcers at this point. Fortunately there does not appear to be any signs of active infection which is great news and I am extremely pleased with where things are progressing at this time. There is no sign of active infection currently. The patient is very pleased to see things doing so well. Objective Constitutional Obese and well-hydrated in no acute distress. Vitals Time Taken: 8:53 AM, Temperature: 98.4 F, Pulse: 75 bpm, Respiratory Rate: 16 breaths/min, Blood Pressure: 151/68 mmHg. Respiratory normal breathing without difficulty. Psychiatric this patient is able to make  decisions and demonstrates good insight into disease process. Alert and Oriented x 3. pleasant and cooperative. General Notes: Patient's wound bed currently showed signs of good granulation at this time and epithelization. There does not appear to be any evidence of active infection which is great news. Overall extremely pleased with where we stand today. 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 ([id number]) Secondary Dressing: ABD and Kerlix/Conform - absorptive dressing of choice Dressing Change Frequency: Change dressing every day. - and as needed Follow-up Appointments: Return Appointment in 1 week. Edema Control: Elevate legs to the level of the heart and pump ankles as often as possible Compression Pump: Use compression pump on left lower extremity for 60 minutes, twice daily. Compression Pump: Use compression pump on right lower extremity for 60 minutes, twice daily. Other: - ACE wraps 1. I would recommend currently that we continue with wound care measures as before. The patient is in agreement with the plan and specifically this is good to mean that we utilize essentially absorptive pads although she is using the derma cloud underneath this which I think is hopefully good be helpful for her as well. At home she is using large diapers to catch the drainage she states that she is having to use less of those now which is even better news. 2. I do recommend she needs to elevate her legs and she can use her lymphedema pumps that would be of ideal consequence for her. 3. I would also recommend she sleep in her bed and  elevate her legs as much as possible. We will see patient back for reevaluation in 1 week here in the clinic. If anything worsens or changes patient will contact our office for additional recommendations. Electronic Signature(s) Signed: 12/22/2019  6:03:10 PM By: Worthy Keeler PA-C Entered By: Worthy Keeler on 12/22/2019 18:03:10 Calixto, [name] ([id number]) -------------------------------------------------------------------------------- SuperBill Details Patient Name: [name] Date of Service: 12/22/2019 Medical Record Number: [id number] Patient Account Number: [id number] Date of Birth/Sex: [birth year] (64 y.o. F) Treating RN: Montey Hora Primary Care Provider: Tandy Gaw Other Clinician: Referring Provider: Tandy Gaw Treating Provider/Extender: STONE III, Jenese Mischke Weeks in Treatment: 3 Diagnosis Coding ICD-10 Codes Code Description Q82.0 Hereditary lymphedema L97.811 Non-pressure chronic ulcer of other part of right lower leg limited to breakdown of skin Facility Procedures CPT4 Code: 12197588 Description: 99213 - WOUND CARE VISIT-LEV 3 EST PT Modifier: Quantity: 1 Physician Procedures CPT4 Code: 3254982 Description: 64158 - WC PHYS LEVEL 3 - EST PT Modifier: Quantity: 1 CPT4 Code: Description: ICD-10 Diagnosis Description Q82.0 Hereditary lymphedema L97.811 Non-pressure chronic ulcer of other part of right lower leg limited to bre Modifier: akdown of skin Quantity: Electronic Signature(s) Signed: 12/22/2019 6:03:31 PM By: Worthy Keeler PA-C Entered By: Worthy Keeler on 12/22/2019 18:03:31

## 2019-12-23 NOTE — Progress Notes (Signed)
JENAVIEVE, FREDA ([id number]) Visit Report for 12/22/2019 Arrival Information Details Patient Name: [name], [name]. Date of Service: 12/22/2019 9:00 AM Medical Record Number: [id number] Patient Account Number: [id number] Date of Birth/Sex: 08/24/1955 (64 y.o. F) Treating RN: Cornell Barman Primary Care Daphine Loch: Tandy Gaw Other Clinician: Referring Denyse Fillion: Tandy Gaw Treating Sabah Zucco/Extender: Melburn Hake, HOYT Weeks in Treatment: 3 Visit Information History Since Last Visit Added or deleted any medications: No Patient Arrived: Cane Has Dressing in Place as Prescribed: Yes Arrival Time: 08:52 Pain Present Now: No Accompanied By: self Transfer Assistance: None Patient Identification Verified: Yes Secondary Verification Process Completed: Yes Electronic Signature(s) Signed: 12/23/2019 5:04:05 PM By: Gretta Cool, BSN, RN, CWS, Kim RN, BSN Entered By: Gretta Cool, BSN, RN, CWS, Kim on 12/22/2019 08:52:59 [name] ([id number]) -------------------------------------------------------------------------------- Clinic Level of Care Assessment Details Patient Name: [name]. Date of Service: 12/22/2019 9:00 AM Medical Record Number: [id number] Patient Account Number: [id number] Date of Birth/Sex: [birth year] (64 y.o. F) Treating RN: Montey Hora Primary Care Catheryne Deford: Tandy Gaw Other Clinician: Referring Carmelia Tiner: Tandy Gaw Treating Apollo Timothy/Extender: STONE III, HOYT Weeks in Treatment: 3 Clinic Level of Care Assessment Items TOOL 4 Quantity Score []  - Use when only an EandM is performed on FOLLOW-UP visit 0 ASSESSMENTS - Nursing Assessment / Reassessment X - Reassessment of Co-morbidities (includes updates in patient status) 1 10 X- 1 5 Reassessment of Adherence to Treatment Plan ASSESSMENTS - Wound and Skin Assessment / Reassessment []  - Simple Wound Assessment / Reassessment - one wound 0 []  - 0 Complex Wound Assessment / Reassessment - multiple wounds X- 1  10 Dermatologic / Skin Assessment (not related to wound area) ASSESSMENTS - Focused Assessment X - Circumferential Edema Measurements - multi extremities 1 5 []  - 0 Nutritional Assessment / Counseling / Intervention X- 1 5 Lower Extremity Assessment (monofilament, tuning fork, pulses) []  - 0 Peripheral Arterial Disease Assessment (using hand held doppler) ASSESSMENTS - Ostomy and/or Continence Assessment and Care []  - Incontinence Assessment and Management 0 []  - 0 Ostomy Care Assessment and Management (repouching, etc.) PROCESS - Coordination of Care X - Simple Patient / Family Education for ongoing care 1 15 []  - 0 Complex (extensive) Patient / Family Education for ongoing care X- 1 10 Staff obtains Programmer, systems, Records, Test Results / Process Orders []  - 0 Staff telephones HHA, Nursing Homes / Clarify orders / etc []  - 0 Routine Transfer to another Facility (non-emergent condition) []  - 0 Routine Hospital Admission (non-emergent condition) []  - 0 New Admissions / Biomedical engineer / Ordering NPWT, Apligraf, etc. []  - 0 Emergency Hospital Admission (emergent condition) X- 1 10 Simple Discharge Coordination []  - 0 Complex (extensive) Discharge Coordination PROCESS - Special Needs []  - Pediatric / Minor Patient Management 0 []  - 0 Isolation Patient Management []  - 0 Hearing / Language / Visual special needs []  - 0 Assessment of Community assistance (transportation, D/C planning, etc.) []  - 0 Additional assistance / Altered mentation []  - 0 Support Surface(s) Assessment (bed, cushion, seat, etc.) INTERVENTIONS - Wound Cleansing / Measurement Coleman, Emilyann C. ([id number]) []  - 0 Simple Wound Cleansing - one wound []  - 0 Complex Wound Cleansing - multiple wounds []  - 0 Wound Imaging (photographs - any number of wounds) []  - 0 Wound Tracing (instead of photographs) []  - 0 Simple Wound Measurement - one wound []  - 0 Complex Wound Measurement - multiple  wounds INTERVENTIONS - Wound Dressings []  - Small Wound Dressing one or multiple wounds 0 []  - 0 Medium Wound  Dressing one or multiple wounds X- 1 20 Large Wound Dressing one or multiple wounds []  - 0 Application of Medications - topical []  - 0 Application of Medications - injection INTERVENTIONS - Miscellaneous []  - External ear exam 0 []  - 0 Specimen Collection (cultures, biopsies, blood, body fluids, etc.) []  - 0 Specimen(s) / Culture(s) sent or taken to Lab for analysis []  - 0 Patient Transfer (multiple staff / Civil Service fast streamer / Similar devices) []  - 0 Simple Staple / Suture removal (25 or less) []  - 0 Complex Staple / Suture removal (26 or more) []  - 0 Hypo / Hyperglycemic Management (close monitor of Blood Glucose) []  - 0 Ankle / Brachial Index (ABI) - do not check if billed separately X- 1 5 Vital Signs Has the patient been seen at the hospital within the last three years: Yes Total Score: 95 Level Of Care: New/Established - Level 3 Electronic Signature(s) Signed: 12/22/2019 4:29:48 PM By: Montey Hora Entered By: Montey Hora on 12/22/2019 09:07:50 Thain, [name] ([id number]) -------------------------------------------------------------------------------- Encounter Discharge Information Details Patient Name: [name]. Date of Service: 12/22/2019 9:00 AM Medical Record Number: [id number] Patient Account Number: [id number] Date of Birth/Sex: 1955-07-18 (64 y.o. F) Treating RN: Montey Hora Primary Care Sadaf Przybysz: Tandy Gaw Other Clinician: Referring Nicoli Nardozzi: Tandy Gaw Treating Kennede Lusk/Extender: Melburn Hake, HOYT Weeks in Treatment: 3 Encounter Discharge Information Items Discharge Condition: Stable Ambulatory Status: Cane Discharge Destination: Home Transportation: Private Auto Accompanied By: self Schedule Follow-up Appointment: Yes Clinical Summary of Care: Electronic Signature(s) Signed: 12/22/2019 4:29:48 PM By: Montey Hora Entered  By: Montey Hora on 12/22/2019 09:08:59 [name] ([id number]) -------------------------------------------------------------------------------- Lower Extremity Assessment Details Patient Name: [name]. Date of Service: 12/22/2019 9:00 AM Medical Record Number: [id number] Patient Account Number: [id number] Date of Birth/Sex: Sep 20, 1955 (64 y.o. F) Treating RN: Cornell Barman Primary Care Tamkia Temples: Tandy Gaw Other Clinician: Referring Layla Gramm: Tandy Gaw Treating Lucilla Petrenko/Extender: STONE III, HOYT Weeks in Treatment: 3 Edema Assessment Assessed: [Left: No] [Right: No] Edema: [Left: Ye] [Right: s] Calf Left: Right: Point of Measurement: 34 cm From Medial Instep cm 94 cm Ankle Left: Right: Point of Measurement: 13 cm From Medial Instep cm 53 cm Vascular Assessment Pulses: Dorsalis Pedis Palpable: [Right:Yes] Electronic Signature(s) Signed: 12/23/2019 5:04:05 PM By: Gretta Cool, BSN, RN, CWS, Kim RN, BSN Entered By: Gretta Cool, BSN, RN, CWS, Kim on 12/22/2019 08:58:46 Paradise, [name] ([id number]) -------------------------------------------------------------------------------- Multi Wound Chart Details Patient Name: [name]. Date of Service: 12/22/2019 9:00 AM Medical Record Number: [id number] Patient Account Number: [id number] Date of Birth/Sex: [birth year] (64 y.o. F) Treating RN: Montey Hora Primary Care Janazia Schreier: Tandy Gaw Other Clinician: Referring Roxene Alviar: Tandy Gaw Treating Hagen Tidd/Extender: STONE III, HOYT Weeks in Treatment: 3 Vital Signs Height(in): Pulse(bpm): 75 Weight(lbs): Blood Pressure(mmHg): 151/68 Body Mass Index(BMI): Temperature(F): 98.4 Respiratory Rate(breaths/min): 16 Wound Assessments Treatment Notes Electronic Signature(s) Signed: 12/22/2019 4:29:48 PM By: Montey Hora Entered By: Montey Hora on 12/22/2019 09:06:35 [name]  ([id number]) -------------------------------------------------------------------------------- Multi-Disciplinary Care Plan Details Patient Name: [name]. Date of Service: 12/22/2019 9:00 AM Medical Record Number: [id number] Patient Account Number: [id number] Date of Birth/Sex: Mar 20, 1956 (64 y.o. F) Treating RN: Montey Hora Primary Care Jermone Geister: Tandy Gaw Other Clinician: Referring Travone Georg: Tandy Gaw Treating Merced Brougham/Extender: STONE III, HOYT Weeks in Treatment: 3 Active Inactive Abuse / Safety / Falls / Self Care Management Nursing Diagnoses: Potential for falls Goals: Patient will remain injury free related to falls Date Initiated: 11/27/2019 Target Resolution Date: 02/13/2020 Goal Status: Active Interventions: Assess fall risk on  admission and as needed Notes: Orientation to the Wound Care Program Nursing Diagnoses: Knowledge deficit related to the wound healing center program Goals: Patient/caregiver will verbalize understanding of the [facility] Date Initiated: 11/27/2019 Target Resolution Date: 02/13/2020 Goal Status: Active Interventions: Provide education on orientation to the wound center Notes: Venous Leg Ulcer Nursing Diagnoses: Potential for venous Insuffiency (use before diagnosis confirmed) Goals: Patient will maintain optimal edema control Date Initiated: 11/27/2019 Target Resolution Date: 02/13/2020 Goal Status: Active Interventions: Compression as ordered Notes: Electronic Signature(s) Signed: 12/22/2019 4:29:48 PM By: Montey Hora Entered By: Montey Hora on 12/22/2019 09:06:23 Jerde, [name] ([id number]) -------------------------------------------------------------------------------- Pain Assessment Details Patient Name: [name]. Date of Service: 12/22/2019 9:00 AM Medical Record Number: [id number] Patient Account Number: [id number] Date of Birth/Sex: Jun 27, 1956 (64 y.o. F) Treating RN: Cornell Barman Primary Care Christion Leonhard: Tandy Gaw Other Clinician: Referring Azalynn Maxim: Tandy Gaw Treating Athanasius Kesling/Extender: Melburn Hake, HOYT Weeks in Treatment: 3 Active Problems Location of Pain Severity and Description of Pain Patient Has Paino No Site Locations Pain Management and Medication Current Pain Management: Electronic Signature(s) Signed: 12/23/2019 5:04:05 PM By: Gretta Cool, BSN, RN, CWS, Kim RN, BSN Entered By: Gretta Cool, BSN, RN, CWS, Kim on 12/22/2019 08:53:56 Swarm, [name] ([id number]) -------------------------------------------------------------------------------- Patient/Caregiver Education Details Patient Name: [name] Date of Service: 12/22/2019 9:00 AM Medical Record Number: [id number] Patient Account Number: [id number] Date of Birth/Gender: [birth year] (64 y.o. F) Treating RN: Montey Hora Primary Care Physician: Tandy Gaw Other Clinician: Referring Physician: Tandy Gaw Treating Physician/Extender: Sharalyn Ink in Treatment: 3 Education Assessment Education Provided To: Patient Education Topics Provided Venous: Handouts: Other: edema management Methods: Explain/Verbal Responses: State content correctly Electronic Signature(s) Signed: 12/22/2019 4:29:48 PM By: Montey Hora Entered By: Montey Hora on 12/22/2019 77:41:42 [name] ([id number]) -------------------------------------------------------------------------------- Vitals Details Patient Name: [name]. Date of Service: 12/22/2019 9:00 AM Medical Record Number: [id number] Patient Account Number: [id number] Date of Birth/Sex: 08-31-1955 (64 y.o. F) Treating RN: Cornell Barman Primary Care Truett Mcfarlan: Tandy Gaw Other Clinician: Referring Darcy Barbara: Tandy Gaw Treating Ala Capri/Extender: STONE III, HOYT Weeks in Treatment: 3 Vital Signs Time Taken: 08:53 Temperature (F): 98.4 Pulse (bpm): 75 Respiratory Rate (breaths/min): 16 Blood Pressure (mmHg):  151/68 Reference Range: 80 - 120 mg / dl Electronic Signature(s) Signed: 12/23/2019 5:04:05 PM By: Gretta Cool, BSN, RN, CWS, Kim RN, BSN Entered By: Gretta Cool, BSN, RN, CWS, Kim on 12/22/2019 08:53:38

## 2019-12-29 ENCOUNTER — Ambulatory Visit: Payer: Medicaid Other | Admitting: Infectious Diseases

## 2019-12-29 ENCOUNTER — Encounter: Payer: Medicaid Other | Admitting: Physician Assistant

## 2019-12-29 ENCOUNTER — Other Ambulatory Visit: Payer: Self-pay

## 2019-12-29 DIAGNOSIS — E11622 Type 2 diabetes mellitus with other skin ulcer: Secondary | ICD-10-CM | POA: Diagnosis not present

## 2019-12-29 NOTE — Progress Notes (Signed)
[name], [name] ([id number]) Visit Report for 12/29/2019 Chief Complaint Document Details Patient Name: [name], [name]. Date of Service: 12/29/2019 8:00 AM Medical Record Number: [id number] Patient Account Number: [id number] Date of Birth/Sex: [birth year] (64 y.o. F) Treating RN: Cornell Barman Primary Care Provider: Tandy Gaw Other Clinician: Referring Provider: Tandy Gaw Treating Provider/Extender: Melburn Hake, Peola Joynt Weeks in Treatment: 4 Information Obtained from: Patient Chief Complaint Bilateral LE lymphedema Electronic Signature(s) Signed: 12/29/2019 8:25:44 AM By: Worthy Keeler PA-C Entered By: Worthy Keeler on 12/29/2019 08:25:44 [name], [name] ([id number]) -------------------------------------------------------------------------------- HPI Details Patient Name: [name]. Date of Service: 12/29/2019 8:00 AM Medical Record Number: [id number] Patient Account Number: [id number] Date of Birth/Sex: 1955-10-24 (64 y.o. F) Treating RN: Cornell Barman Primary Care Provider: Tandy Gaw Other Clinician: Referring Provider: Tandy Gaw Treating Provider/Extender: STONE III, Persis Graffius Weeks in Treatment: 4 History of Present Illness HPI Description: The patient is a 64 year old female with history of hypertension and a long-standing history of bilateral lower extremity lymphedema (first presented on 4/2) . She has had open ulcers in the past which have always responded to compression therapy. She had briefly been to a lymphedema clinic in the past which helped her at the time. this time around she stopped treatment of her lymphedema pumps approximately 2 weeks ago because of some pain in the knees and then noticed the right leg getting worse. She was seen by her PCP who put her on clindamycin 4 times a day 2 days ago. The patient has seen [facility] and Dr. Delana Meyer had seen her last year where a vascular study including venous and arterial duplex studies were within normal limits. he  had recommended compression stockings and lymphedema pumps and the patient has been using this in about 2 weeks ago. She is known to be diabetic but in the past few time she's gone to her primary care doctor her hemoglobin A1c has been normal. 02/11/2015 - after her last visit she took my advice and went to the ER regarding the progressive cellulitis of her right lower extremity and she was admitted between July 17 and 22nd. She received IV antibiotics and then was sent home on a course of steroid-induced and oral antibiotics. She has improved much since then. 02/17/2015 -- she has been doing fine and the weeping of her legs has remarkably gone down. She has no fresh issues. READMISSION 01/15/18 This patient was given this clinic before most recently in 2016 seen by Dr. Con Memos. She has massive bilateral lymphedema and over the last 2 months this had weeping edema out of the left leg. She has compression pumps but her compliance with these has been minimal. She has [facility] they've been using TCA/ABDs/kerlix under an Ace wrap.she has had recent problems with cellulitis. She was apparently seen in the ER and 12/23/17 and given clindamycin. She was then followed by her primary doctor and given doxycycline and Keflex. The pain seems to have settled down. In April 2018 the patient had arterial studies done at [facility]. This showed triphasic waveforms throughout the right leg and mostly triphasic waveforms on the left except for monophasic at the posterior tibial artery distally. She was not felt to have evidence of right lower extremity arterial stenosis or significant problems on the left side. 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 She also had a right lower extremity venous Doppler in January 2018 this was limited by the patient's body habitus and lymphedema. Most of the  proximal veins were not visualized The patient presents with an area of  denuded skin on the anterior medial part of the left calf. There is weeping edema fluid here. 01/22/18; the patient has somewhat better edema control using her compression pumps twice a day and as a result she has much better epithelialization on the left anterior calf area. Only a small open area remains. 01/29/18; the patient has been compliant with her compression pumps. Both the areas on her calf that healed. The remaining area on the left anterior leg is fully epithelialized Readmission: 02/20/2019 upon evaluation today patient presents for reevaluation due to issues that she is having with the bilateral lower extremities. She actually has wounds open on both legs. On the right she has an area in the crease of her leg on the right around the knee region which is actually draining quite a bit and actually has some fungal type appearance to it. She has been on nystatin powder that seems to have helped to some degree. In regard to the left lower extremity this is actually in the lower portion of her leg closer to the ankle and again is continuing to drain as well unfortunately. There does not appear to be any signs of active infection at this time which is good news. No fevers, chills, nausea, vomiting, or diarrhea. She tells me that since she was seen last year she is actually been doing quite well for the most part with regard to her lower extremities. Unfortunately she now is experiencing a little bit more drainage at this time. She is concerned about getting this under control so that it does not get significantly worse. 02/27/2019 on evaluation today patient appears to be doing somewhat better in regard to her bilateral lower extremity wounds. She has been tolerating the dressing changes without complication. Fortunately there is no signs of active infection at this point. No fevers, chills, nausea, vomiting, or diarrhea. She did get her dressing supplies which is excellent news she was extremely  excited to get these. She also got paperwork from [employer] for their financial assistance program where they may be able to help her out in the future if needed with supplies at discounted prices. 03/06/2019 on evaluation today patient appears to be doing a little worse with regard to both areas of weeping on her bilateral lower extremities. This is around the right medial knee and just above the left ankle. With that being said she is unfortunately not doing as well as I would like to see. I feel like she may need to potentially go see someone at the lymphedema clinic as the wraps that she needs or even beyond what we can do here at the wound care center. She really does not have wounds she just has open areas of weeping that are causing some difficulty for her. Subsequently because of this and the moisture I am concerned about the potential for infection I am going to likely give her a prophylactic antibiotic today, Keflex, just to be on the safe side. Nonetheless again there is no obvious signs of active infection at this time. 03/13/2019 on evaluation today patient appears to be doing well with regard to her bilateral lower extremities where she has been weeping compared to even last week's evaluation. I see some areas of new skin growth which is excellent and overall I am very pleased with how things seem to be progressing. No fevers, chills, nausea, vomiting, or diarrhea. [name], [name] ([id number]) 03/20/2019 on evaluation  today patient unfortunately is continuing to have issues with significant edema of the left lower extremity. Her right side seems to be doing much better. Unfortunately her left side is showing increased weeping of the lower portion of her leg. This is quite unfortunate obviously we were hoping to get her into the lymphedema clinic they really do not seem to when I see her how if she is draining. Despite the fact this is really not wound related but more lymphedema weeping  related. Nonetheless I do not know that this can be helpful for her to even go for that appointment since again I am not sure there is much that they would actually do at this point. We may need to try a 4 layer compression wrap as best we can on her leg. She is on the Augmentin currently although I am still concerned about whether or not there could be potentially something going on infection wise I would obtain a culture though I understand is not the best being that is a surface culture I just 1 to make sure I do not seem to be missing anything. 03/27/2019 on evaluation today patient appears to be doing much better in regard to the left lower extremity compared to last week. Last week she had tremendous weeping which I think was subsequent to infection now she seems to be doing much better and very pleased. This is not completely healed but there is a lot of new skin growth and it has dried out quite a bit. Overall I think that we are doing well with how things are moving along at this time. No fevers, chills, nausea, vomiting, or diarrhea. 04/03/2019 evaluation today patient appears to be doing a little worse this week compared to last time I saw her. I think this may be due to the fact that she is having issues with not being able to sleep in her bed at least not until last night. She is therefore been in a lift chair and subsequently has also had issues with not been able to use her pumps since she could not get in bed. With that being said the patient overall seems to be doing okay I do think I may want extend the antibiotic for a little bit longer at least until we can see if her edema and her weeping gets better and if it is then obviously I can always discontinue the antibiotics as of next week however I want her to continue to have it over the next week. 04/10/2019 on evaluation today patient unfortunately is still doing poorly with regard to her left lower extremity. Her right is all things  considering doing fairly well. On the left however she continues to have spreading of the area of infection and weeping which appears to be even a larger surface area than noted last week. She did have a positive culture for Pseudomonas in particular which seems to have been of concern she still has green/yellow discharge consistent with Pseudomonas and subsequently a tremendous amount of it. This has me obviously still concerned about the infection not really clearing up despite the fact that on culture it appears the Cipro should have been a good option for treating this. I think she may at this point need IV antibiotics since things are not doing better I do not want to get worse and cause sepsis. She is in agreement with the plan and believes as well that she likely does need to go to the hospital for  IV vancomycin. Or something of the like depending on what the recommendation is from the ER. 04/17/2019 on evaluation today patient appears to be doing excellent in regard to her lower extremity on the left. She was in the hospital for several days from when I sent her last we saw her until just this past Tuesday. Fortunately her drainage is significantly improved and in fact is mostly clear. There is just a couple small areas that may still drain a little bit she states that the [facility] they prescribed for her at discharge she went picked up from pharmacy and got home but has not been able to find it since. She is looked everywhere. She is wondering if I will replace that for her today I will be more than happy to do that. 05/01/2019 on evaluation today patient actually appears to be doing quite well with regard to her lower extremities. She occasionally is having areas that will leak and then heal up mainly when a piece of the fibrotic skin pops off but fortunately she is not having any signs of active infection at this time. Overall she also really does not have any obvious weeping at this time. I  do believe however she really needs some compression wraps and I think this may be a good time to get her back to the lymphedema clinic. 05/11/2019 on evaluation today patient actually appears to be doing quite well with regard to her bilateral lower extremities. She occasionally will have a small area that we per another but in general seems to be completely healed which is great news. Overall very pleased with how everything seems to be progressing. She does have her appointment with lymphedema clinic on November 18. 05/25/2019 on evaluation today patient appears to be doing well with regard to her left lower extremity. I am very pleased in this regard. In regard to her right leg this actually did start draining more I think it is mainly due to the fact that her leg is more swollen. I am not seeing any obvious signs of infection at this time although that is definitely something were obviously acutely aware of simply due to the fact that she had an issue not too far back with exactly this issue. Nonetheless I do feel like that lymphedema clinic would still be beneficial for her. I explained obviously if they are not able to do anything treatment wise on the right leg we could at least have them treat her left leg and then proceed from there. The patient is really in agreement with that plan. If they are able to do both as the drainage slows down that I would be happy to let them handle both. 06/01/2019 on evaluation today patient unfortunately appears to be doing worse with regard to her right lower extremity. The left lower extremity is still maintaining at this point. Unfortunately she has been having significantly increased pain over the past several days and has been experiencing as well increased swelling of the right lower extremity. I really do not know that I am seeing anything that appears to be obvious for infection at this point to be peripherally honest. With that being said the patient  does seem to be having much more swelling that she is even experienced in the past and coupled with increased pain in her hip as well I am concerned that again she could potentially have a DVT although I am not 100% sure of this. I think it something that may need to  be checked out. We discussed the possibility of sending her for a DVT study through the hospital but unfortunately transportation is an issue if she does have a DVT I do not want her to wait days to be able to get in for that test however if she has this scheduled as an outpatient that is as fast that she will be able to get the test scheduled for transportation purposes. That will also fall on Thanksgiving so subsequently she did actually be looking at either Friday or even next week before we would know anything back from this. That is much too long in my opinion. Subsequent to the amount of discomfort she is experiencing the patient is actually okay with going to the ER for evaluation today. 06/12/2019 on evaluation today patient actually appears to be doing significantly better compared to last time I saw her. Following when I last saw her she was actually in the hospital from that Monday until the following Sunday almost 1 full week. She actually was placed on Keflex in the hospital following the time for her to be discharged and Dr. Steva Ready has recommended 2 times a day dosing of the Keflex for the next year in order to help with more prophylactic/preventative measures with regard to her developing cellulitis. Overall I think this sounds like an excellent plan. The patient unfortunately is good to have trouble being treated at lymphedema clinic due to the fact that she really cannot get up on the bed that they have there. They also state that they cannot manage her as long as she has anything draining at this point. Obviously that is somewhat unfortunate as she does need help with edema control but nonetheless we will have to do  what we can for her outside of it sounds like the lymphedema clinic scenario at this point. 06/19/2019 on evaluation today patient appears to be doing fairly well with regard to her bilateral lower extremities. She is not nearly as swollen and shows no signs of infection at this point. There is no evidence of cellulitis whatsoever. She also has no open wounds or draining at this point which is also good news. No fever chills noted. She seems to be in very good spirits and in fact appears to be doing quite well. READMISSION 11/27/2019 [name], [name] ([id number]) This is a 64 year old woman that we have had in this clinic several times before including 2015, 16 and 19 and then most recently from 03/20/2019 through 06/19/2019 with bilateral lower extremity lymphedema. She has had previous arterial and reflux studies done years ago which were not all that remarkable. 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 She does have a positive family history and she had large legs starting may be in her 62s. She was recently in hospital from 10/20/2019 through 10/28/2019 with right leg cellulitis. She was given Ancef and clindamycin and then Zosyn when a culture showed Pseudomonas. At that time there was purulent drainage. She was followed by infectious disease Dr Steva Ready. The patient is now back at home. She has noted increased swelling in the right and no drainage in her right leg mostly on the posterior medial aspect in the calf area. She has not had pain or fever. She has literally been improved lysing above dressings because her at the area of this is far too large for standard compression. She has been wrapping the areas with sheets to resorptive pads. She is found these helped  somewhat. She does have an appointment with the lymphedema clinic in [place] in late June. Past medical history includes bilateral lymphedema, hypertension, obstructive  sleep apnea with CPAP. Recent hospitalization with apparently Pseudomonas cellulitis of the right lower leg 12/15/2019 upon evaluation today patient appears to be doing a little bit worse in regard to her right lower extremity. Unfortunately she is having more weeping down in the lower portion of her leg. Fortunately there is no signs of active infection at this time. No fever chills noted. The patient states she is not having increased pain except for when she attempted to use the lymphedema pumps unfortunately she states that she did have pain when she did this. Otherwise we been using absorptive dressings of one type or another she is using diapers at home and then subsequently Ace wraps. In regard to the barrier cream we have discussed the possibility of derma cloud which she would like to try I do not have a problem with that. 12/22/2019 upon evaluation today patient actually appears to be doing better in regard to her leg ulcers at this point. Fortunately there does not appear to be any signs of active infection which is great news and I am extremely pleased with where things are progressing at this time. There is no sign of active infection currently. The patient is very pleased to see things doing so well. 12/29/2019 upon evaluation today patient appears to be doing a little bit better in regard to her weeping in general over her lower extremities. She does have some signs of mild erythema little bit more than what I noted last week or rather last visit. Nonetheless I think that my threshold for switching her antibiotics from Keflex to something else is very low at this point considering that she has had such severe infections in the past that seem to come almost out of nowhere. There is a little erythema and warmth noted of the lower portion of her leg compared to the upper which also makes me want to go ahead and address things more rapidly at this point. Likely I would switch out the Keflex for  something like Levaquin ideally. Electronic Signature(s) Signed: 12/29/2019 8:39:06 AM By: Worthy Keeler PA-C Entered By: Worthy Keeler on 12/29/2019 08:39:05 Wissing, [name] ([id number]) -------------------------------------------------------------------------------- Physical Exam Details Patient Name: [name], [name] C. Date of Service: 12/29/2019 8:00 AM Medical Record Number: [id number] Patient Account Number: [id number] Date of Birth/Sex: 11-14-1955 (64 y.o. F) Treating RN: Montey Hora Primary Care Provider: Tandy Gaw Other Clinician: Referring Provider: Tandy Gaw Treating Provider/Extender: STONE III, Kass Herberger Weeks in Treatment: 4 Constitutional Chronically ill appearing but in no apparent acute distress. Respiratory normal breathing without difficulty. Psychiatric this patient is able to make decisions and demonstrates good insight into disease process. Alert and Oriented x 3. pleasant and cooperative. Notes Upon inspection patient's leg actually for the most part looks a lot better although the lateral portion of her leg I did obtain a wound culture from today there is increased drainage here and to be honest along with erythema I am a little concerned about the possibility of infection and therefore I am going to have her discontinue the Keflex and look toward using Levaquin at this point. Electronic Signature(s) Signed: 12/29/2019 8:39:29 AM By: Worthy Keeler PA-C Entered By: Worthy Keeler on 12/29/2019 08:39:28 [name], [name] ([id number]) -------------------------------------------------------------------------------- Physician Orders Details Patient Name: [name] Date of Service: 12/29/2019 8:00 AM Medical Record Number: [id number] Patient Account  Number: [id number] Date of Birth/Sex: 01/31/1956 (64 y.o. 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 - and as needed Follow-up Appointments Wound #7 Right,Lateral Lower Leg o Return Appointment in 2 weeks. Edema Control Wound #7 Right,Lateral Lower Leg o Elevate legs to the level of the heart and pump ankles as often as possible o Compression Pump: Use compression pump on left lower extremity for 60 minutes, twice daily. o Compression Pump: Use compression pump on right lower extremity for 60 minutes, twice daily. o Other: - ACE wraps Laboratory o Bacteria identified in Wound by Culture (MICRO) oooo LOINC Code: 0932-3 FTDD Convenience Name: Wound culture routine Patient Medications Allergies: ibuprofen, ACE Inhibitors Notifications Medication Indication Start End Levaquin 12/29/2019 DOSE 1 - oral 500 mg tablet - 1 tablet oral taken 1 time per day for 14 days Electronic Signature(s) Signed: 12/29/2019 8:40:57 AM By: Worthy Keeler PA-C Entered By: Worthy Keeler on 12/29/2019 08:40:56 [name], [name] ([id number]) -------------------------------------------------------------------------------- Problem List Details Patient Name: [name]. Date of Service: 12/29/2019 8:00 AM Medical Record Number: [id number] Patient Account Number: [id number] Date of Birth/Sex: September 10, 1955 (64 y.o. 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 Date of Service: 12/29/2019 8:00 AM Medical Record Number: [id number] Patient Account Number: [id number] Date of Birth/Sex: 06-Oct-1955 (64 y.o. F) Treating RN: Montey Hora Primary Care Provider: Tandy Gaw Other Clinician: Referring Provider: Tandy Gaw Treating Provider/Extender: STONE III, Alphonsa Brickle Weeks in Treatment: 4 Subjective Chief Complaint Information obtained from Patient Bilateral LE lymphedema History of Present Illness (HPI) The patient is a 64 year old female with history of hypertension and a long-standing history of bilateral lower extremity lymphedema (first presented on 4/2) . She has had open ulcers in the past which have always responded to compression therapy. She had briefly been to a lymphedema clinic in the past which helped her at the time. this time around she stopped treatment of her lymphedema pumps approximately 2 weeks ago because of some pain in the knees and then noticed the right leg getting worse. She was seen by her PCP who put her on clindamycin 4 times a day 2 days ago. The patient has seen [facility] and Dr. Delana Meyer had seen her last year where a vascular study including venous and arterial duplex studies were within normal limits. he had recommended compression stockings and lymphedema pumps and the patient has been using this in about 2 weeks ago. She is known to be diabetic but in the past few time she's gone to her primary care  doctor her hemoglobin  A1c has been normal. 02/11/2015 - after her last visit she took my advice and went to the ER regarding the progressive cellulitis of her right lower extremity and she was admitted between July 17 and 22nd. She received IV antibiotics and then was sent home on a course of steroid-induced and oral antibiotics. She has improved much since then. 02/17/2015 -- she has been doing fine and the weeping of her legs has remarkably gone down. She has no fresh issues. READMISSION 01/15/18 This patient was given this clinic before most recently in 2016 seen by Dr. Con Memos. She has massive bilateral lymphedema and over the last 2 months this had weeping edema out of the left leg. She has compression pumps but her compliance with these has been minimal. She has [facility] they've been using TCA/ABDs/kerlix under an Ace wrap.she has had recent problems with cellulitis. She was apparently seen in the ER and 12/23/17 and given clindamycin. She was then followed by her primary doctor and given doxycycline and Keflex. The pain seems to have settled down. In April 2018 the patient had arterial studies done at [facility]. This showed triphasic waveforms throughout the right leg and mostly triphasic waveforms on the left except for monophasic at the posterior tibial artery distally. She was not felt to have evidence of right lower extremity arterial stenosis or significant problems on the left side. 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 She also had a right lower extremity venous Doppler in January 2018 this was limited by the patient's body habitus and lymphedema. Most of the proximal veins were not visualized The patient presents with an area of denuded skin on the anterior medial part of the left calf. There is weeping edema fluid here. 01/22/18; the patient has somewhat better edema control using her compression pumps twice a day and as a result  she has much better epithelialization on the left anterior calf area. Only a small open area remains. 01/29/18; the patient has been compliant with her compression pumps. Both the areas on her calf that healed. The remaining area on the left anterior leg is fully epithelialized Readmission: 02/20/2019 upon evaluation today patient presents for reevaluation due to issues that she is having with the bilateral lower extremities. She actually has wounds open on both legs. On the right she has an area in the crease of her leg on the right around the knee region which is actually draining quite a bit and actually has some fungal type appearance to it. She has been on nystatin powder that seems to have helped to some degree. In regard to the left lower extremity this is actually in the lower portion of her leg closer to the ankle and again is continuing to drain as well unfortunately. There does not appear to be any signs of active infection at this time which is good news. No fevers, chills, nausea, vomiting, or diarrhea. She tells me that since she was seen last year she is actually been doing quite well for the most part with regard to her lower extremities. Unfortunately she now is experiencing a little bit more drainage at this time. She is concerned about getting this under control so that it does not get significantly worse. 02/27/2019 on evaluation today patient appears to be doing somewhat better in regard to her bilateral lower extremity wounds. She has been tolerating the dressing changes without complication. Fortunately there is no signs of active infection at this point. No  fevers, chills, nausea, vomiting, or diarrhea. She did get her dressing supplies which is excellent news she was extremely excited to get these. She also got paperwork from [employer] for their financial assistance program where they may be able to help her out in the future if needed with supplies at discounted prices. 03/06/2019  on evaluation today patient appears to be doing a little worse with regard to both areas of weeping on her bilateral lower extremities. This is around the right medial knee and just above the left ankle. With that being said she is unfortunately not doing as well as I would like to see. I feel like she may need to potentially go see someone at the lymphedema clinic as the wraps that she needs or even beyond what we can do here at the wound care center. She really does not have wounds she just has open areas of weeping that are causing some difficulty for her. Subsequently because of this and the moisture I am concerned about the potential for infection I am going to likely give her a prophylactic antibiotic today, Keflex, just to be on the safe side. Nonetheless again there is no obvious signs of active infection at this time. [name], [name] ([id number]) 03/13/2019 on evaluation today patient appears to be doing well with regard to her bilateral lower extremities where she has been weeping compared to even last week's evaluation. I see some areas of new skin growth which is excellent and overall I am very pleased with how things seem to be progressing. No fevers, chills, nausea, vomiting, or diarrhea. 03/20/2019 on evaluation today patient unfortunately is continuing to have issues with significant edema of the left lower extremity. Her right side seems to be doing much better. Unfortunately her left side is showing increased weeping of the lower portion of her leg. This is quite unfortunate obviously we were hoping to get her into the lymphedema clinic they really do not seem to when I see her how if she is draining. Despite the fact this is really not wound related but more lymphedema weeping related. Nonetheless I do not know that this can be helpful for her to even go for that appointment since again I am not sure there is much that they would actually do at this point. We may need to try a 4 layer  compression wrap as best we can on her leg. She is on the Augmentin currently although I am still concerned about whether or not there could be potentially something going on infection wise I would obtain a culture though I understand is not the best being that is a surface culture I just 1 to make sure I do not seem to be missing anything. 03/27/2019 on evaluation today patient appears to be doing much better in regard to the left lower extremity compared to last week. Last week she had tremendous weeping which I think was subsequent to infection now she seems to be doing much better and very pleased. This is not completely healed but there is a lot of new skin growth and it has dried out quite a bit. Overall I think that we are doing well with how things are moving along at this time. No fevers, chills, nausea, vomiting, or diarrhea. 04/03/2019 evaluation today patient appears to be doing a little worse this week compared to last time I saw her. I think this may be due to the fact that she is having issues with not being able to sleep  in her bed at least not until last night. She is therefore been in a lift chair and subsequently has also had issues with not been able to use her pumps since she could not get in bed. With that being said the patient overall seems to be doing okay I do think I may want extend the antibiotic for a little bit longer at least until we can see if her edema and her weeping gets better and if it is then obviously I can always discontinue the antibiotics as of next week however I want her to continue to have it over the next week. 04/10/2019 on evaluation today patient unfortunately is still doing poorly with regard to her left lower extremity. Her right is all things considering doing fairly well. On the left however she continues to have spreading of the area of infection and weeping which appears to be even a larger surface area than noted last week. She did have a positive  culture for Pseudomonas in particular which seems to have been of concern she still has green/yellow discharge consistent with Pseudomonas and subsequently a tremendous amount of it. This has me obviously still concerned about the infection not really clearing up despite the fact that on culture it appears the Cipro should have been a good option for treating this. I think she may at this point need IV antibiotics since things are not doing better I do not want to get worse and cause sepsis. She is in agreement with the plan and believes as well that she likely does need to go to the hospital for IV vancomycin. Or something of the like depending on what the recommendation is from the ER. 04/17/2019 on evaluation today patient appears to be doing excellent in regard to her lower extremity on the left. She was in the hospital for several days from when I sent her last we saw her until just this past Tuesday. Fortunately her drainage is significantly improved and in fact is mostly clear. There is just a couple small areas that may still drain a little bit she states that the [facility] they prescribed for her at discharge she went picked up from pharmacy and got home but has not been able to find it since. She is looked everywhere. She is wondering if I will replace that for her today I will be more than happy to do that. 05/01/2019 on evaluation today patient actually appears to be doing quite well with regard to her lower extremities. She occasionally is having areas that will leak and then heal up mainly when a piece of the fibrotic skin pops off but fortunately she is not having any signs of active infection at this time. Overall she also really does not have any obvious weeping at this time. I do believe however she really needs some compression wraps and I think this may be a good time to get her back to the lymphedema clinic. 05/11/2019 on evaluation today patient actually appears to be doing quite  well with regard to her bilateral lower extremities. She occasionally will have a small area that we per another but in general seems to be completely healed which is great news. Overall very pleased with how everything seems to be progressing. She does have her appointment with lymphedema clinic on November 18. 05/25/2019 on evaluation today patient appears to be doing well with regard to her left lower extremity. I am very pleased in this regard. In regard to her right leg this  actually did start draining more I think it is mainly due to the fact that her leg is more swollen. I am not seeing any obvious signs of infection at this time although that is definitely something were obviously acutely aware of simply due to the fact that she had an issue not too far back with exactly this issue. Nonetheless I do feel like that lymphedema clinic would still be beneficial for her. I explained obviously if they are not able to do anything treatment wise on the right leg we could at least have them treat her left leg and then proceed from there. The patient is really in agreement with that plan. If they are able to do both as the drainage slows down that I would be happy to let them handle both. 06/01/2019 on evaluation today patient unfortunately appears to be doing worse with regard to her right lower extremity. The left lower extremity is still maintaining at this point. Unfortunately she has been having significantly increased pain over the past several days and has been experiencing as well increased swelling of the right lower extremity. I really do not know that I am seeing anything that appears to be obvious for infection at this point to be peripherally honest. With that being said the patient does seem to be having much more swelling that she is even experienced in the past and coupled with increased pain in her hip as well I am concerned that again she could potentially have a DVT although I am not  100% sure of this. I think it something that may need to be checked out. We discussed the possibility of sending her for a DVT study through the hospital but unfortunately transportation is an issue if she does have a DVT I do not want her to wait days to be able to get in for that test however if she has this scheduled as an outpatient that is as fast that she will be able to get the test scheduled for transportation purposes. That will also fall on Thanksgiving so subsequently she did actually be looking at either Friday or even next week before we would know anything back from this. That is much too long in my opinion. Subsequent to the amount of discomfort she is experiencing the patient is actually okay with going to the ER for evaluation today. 06/12/2019 on evaluation today patient actually appears to be doing significantly better compared to last time I saw her. Following when I last saw her she was actually in the hospital from that Monday until the following Sunday almost 1 full week. She actually was placed on Keflex in the hospital following the time for her to be discharged and Dr. Steva Ready has recommended 2 times a day dosing of the Keflex for the next year in order to help with more prophylactic/preventative measures with regard to her developing cellulitis. Overall I think this sounds like an excellent plan. The patient unfortunately is good to have trouble being treated at lymphedema clinic due to the fact that she really cannot get up on the bed that they have there. They also state that they cannot manage her as long as she has anything draining at this point. Obviously that is somewhat unfortunate as she does need help with edema control but nonetheless we will have to do what we can for her outside of it sounds like the lymphedema clinic scenario at this point. 06/19/2019 on evaluation today patient appears to be doing fairly well  with regard to her bilateral lower extremities.  She is not nearly as swollen Guiffre, [name] ([id number]) and shows no signs of infection at this point. There is no evidence of cellulitis whatsoever. She also has no open wounds or draining at this point which is also good news. No fever chills noted. She seems to be in very good spirits and in fact appears to be doing quite well. READMISSION 11/27/2019 This is a 64 year old woman that we have had in this clinic several times before including 2015, 16 and 19 and then most recently from 03/20/2019 through 06/19/2019 with bilateral lower extremity lymphedema. She has had previous arterial and reflux studies done years ago which were not all that remarkable. 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 She does have a positive family history and she had large legs starting may be in her 74s. She was recently in hospital from 10/20/2019 through 10/28/2019 with right leg cellulitis. She was given Ancef and clindamycin and then Zosyn when a culture showed Pseudomonas. At that time there was purulent drainage. She was followed by infectious disease Dr Steva Ready. The patient is now back at home. She has noted increased swelling in the right and no drainage in her right leg mostly on the posterior medial aspect in the calf area. She has not had pain or fever. She has literally been improved lysing above dressings because her at the area of this is far too large for standard compression. She has been wrapping the areas with sheets to resorptive pads. She is found these helped somewhat. She does have an appointment with the lymphedema clinic in [place] in late June. Past medical history includes bilateral lymphedema, hypertension, obstructive sleep apnea with CPAP. Recent hospitalization with apparently Pseudomonas cellulitis of the right lower leg 12/15/2019 upon evaluation today patient appears to be doing a little bit worse in regard to her right lower  extremity. Unfortunately she is having more weeping down in the lower portion of her leg. Fortunately there is no signs of active infection at this time. No fever chills noted. The patient states she is not having increased pain except for when she attempted to use the lymphedema pumps unfortunately she states that she did have pain when she did this. Otherwise we been using absorptive dressings of one type or another she is using diapers at home and then subsequently Ace wraps. In regard to the barrier cream we have discussed the possibility of derma cloud which she would like to try I do not have a problem with that. 12/22/2019 upon evaluation today patient actually appears to be doing better in regard to her leg ulcers at this point. Fortunately there does not appear to be any signs of active infection which is great news and I am extremely pleased with where things are progressing at this time. There is no sign of active infection currently. The patient is very pleased to see things doing so well. 12/29/2019 upon evaluation today patient appears to be doing a little bit better in regard to her weeping in general over her lower extremities. She does have some signs of mild erythema little bit more than what I noted last week or rather last visit. Nonetheless I think that my threshold for switching her antibiotics from Keflex to something else is very low at this point considering that she has had such severe infections in the past that seem to come almost out of nowhere. There is  a little erythema and warmth noted of the lower portion of her leg compared to the upper which also makes me want to go ahead and address things more rapidly at this point. Likely I would switch out the Keflex for something like Levaquin ideally. Objective Constitutional Chronically ill appearing but in no apparent acute distress. Vitals Time Taken: 8:11 AM, Temperature: 98.3 F, Pulse: 74 bpm, Respiratory Rate: 16  breaths/min, Blood Pressure: 145/80 mmHg. Respiratory normal breathing without difficulty. Psychiatric this patient is able to make decisions and demonstrates good insight into disease process. Alert and Oriented x 3. pleasant and cooperative. General Notes: Upon inspection patient's leg actually for the most part looks a lot better although the lateral portion of her leg I did obtain a wound culture from today there is increased drainage here and to be honest along with erythema I am a little concerned about the possibility of infection and therefore I am going to have her discontinue the Keflex and look toward using Levaquin at this point. Integumentary (Hair, Skin) Wound #7 status is Open. Original cause of wound was Gradually Appeared. The wound is located on the Right,Lateral Lower Leg. The wound measures 8.5cm length x 8.5cm width x 0.1cm depth; 56.745cm^2 area and 5.675cm^3 volume. There is Fat Layer (Subcutaneous Tissue) Exposed exposed. There is no tunneling or undermining noted. There is a large amount of purulent drainage noted. The wound margin is indistinct and nonvisible. There is medium (34-66%) pink granulation within the wound bed. There is a medium (34-66%) amount of necrotic tissue within the wound bed including Adherent Slough. 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 - and as needed Follow-up Appointments: Wound #7 Right,Lateral Lower Leg: Return Appointment in 2 weeks. Edema Control: Wound #7 Right,Lateral Lower Leg: Elevate legs to the level of the heart and pump ankles as often as possible Compression Pump: Use compression pump on  left lower extremity for 60 minutes, twice daily. Compression Pump: Use compression pump on right lower extremity for 60 minutes, twice daily. Other: - ACE wraps Laboratory ordered were: Wound culture routine The following medication(s) was prescribed: Levaquin oral 500 mg tablet 1 1 tablet oral taken 1 time per day for 14 days starting 12/29/2019 1. I would recommend currently that we go ahead and initiate treatment with Levaquin for the patient and I did check this against her current medication list there are no interactions. 2. I am also going to recommend that we continue with the derma cloud as I do feel like that is helping her at this point is for is a barrier for the legs of concern. 3. I am also going to suggest at this time that the wound culture be obtained which I did we will see if this shows anything and if so what adjustments may need to be made. 4. She still needs to continue to elevate her legs and she will be seeing lymphedema clinic at [facility] on this coming Monday as well. We will see patient back for reevaluation in 1 week here in the clinic. If anything worsens or changes patient will contact our office for additional recommendations. Electronic Signature(s) Signed: 12/29/2019 8:41:40 AM By: Worthy Keeler PA-C Entered By: Worthy Keeler on 12/29/2019 08:41:40  MUNACHIMSO, RIGDON ([id number]) -------------------------------------------------------------------------------- SuperBill Details Patient Name: JESSIKAH, DICKER. Date of Service: 12/29/2019 Medical Record Number: [id number] Patient Account Number: [id number] Date of Birth/Sex: 10/30/1955 (64 y.o. F) Treating RN: Montey Hora Primary Care Provider: Tandy Gaw Other Clinician: Referring Provider: Tandy Gaw Treating Provider/Extender: STONE III, Draylen Lobue Weeks in Treatment: 4 Diagnosis Coding ICD-10 Codes Code Description Q82.0 Hereditary lymphedema L97.811 Non-pressure chronic ulcer of other part of right  lower leg limited to breakdown of skin Facility Procedures CPT4 Code: [id number] Description: 99213 - WOUND CARE VISIT-LEV 3 EST PT Modifier: Quantity: 1 Physician Procedures CPT4 Code: 2883374 Description: 45146 - WC PHYS LEVEL 4 - EST PT Modifier: Quantity: 1 CPT4 Code: Description: ICD-10 Diagnosis Description Q82.0 Hereditary lymphedema L97.811 Non-pressure chronic ulcer of other part of right lower leg limited to bre Modifier: akdown of skin Quantity: Electronic Signature(s) Signed: 12/29/2019 8:42:14 AM By: Worthy Keeler PA-C Entered By: Worthy Keeler on 12/29/2019 08:42:13

## 2019-12-29 NOTE — Progress Notes (Signed)
LATOYA, MAULDING ([id number]) Visit Report for 12/29/2019 Arrival Information Details Patient Name: LENER, VENTRESCA. Date of Service: 12/29/2019 8:00 AM Medical Record Number: [id number] Patient Account Number: [id number] Date of Birth/Sex: 1956/03/23 (64 y.o. F) Treating RN: Cornell Barman Primary Care Talal Fritchman: Tandy Gaw Other Clinician: Referring Iam Lipson: Tandy Gaw Treating Alliah Boulanger/Extender: Melburn Hake, HOYT Weeks in Treatment: 4 Visit Information History Since Last Visit Added or deleted any medications: No Patient Arrived: Cane Any new allergies or adverse reactions: No Arrival Time: 08:05 Had a fall or experienced change in No Accompanied By: self activities of daily living that may affect Transfer Assistance: None risk of falls: Patient Identification Verified: Yes Signs or symptoms of abuse/neglect since last visito No Secondary Verification Process Completed: Yes Hospitalized since last visit: No Implantable device outside of the clinic excluding No cellular tissue based products placed in the center since last visit: Has Dressing in Place as Prescribed: Yes Pain Present Now: No Electronic Signature(s) Signed: 12/29/2019 11:36:00 AM By: Sandre Kitty Entered By: Sandre Kitty on 12/29/2019 08:11:10 [place], [name] ([id number]) -------------------------------------------------------------------------------- Clinic Level of Care Assessment Details Patient Name: [name]. Date of Service: 12/29/2019 8:00 AM Medical Record Number: [id number] Patient Account Number: [id number] Date of Birth/Sex: 11/03/1955 (64 y.o. F) Treating RN: Montey Hora Primary Care Sarya Linenberger: Tandy Gaw Other Clinician: Referring Averey Koning: Tandy Gaw Treating Greidys Deland/Extender: STONE III, HOYT Weeks in Treatment: 4 Clinic Level of Care Assessment Items TOOL 4 Quantity Score []  - Use when only an EandM is performed on FOLLOW-UP visit 0 ASSESSMENTS - Nursing Assessment /  Reassessment X - Reassessment of Co-morbidities (includes updates in patient status) 1 10 X- 1 5 Reassessment of Adherence to Treatment Plan ASSESSMENTS - Wound and Skin Assessment / Reassessment X - Simple Wound Assessment / Reassessment - one wound 1 5 []  - 0 Complex Wound Assessment / Reassessment - multiple wounds X- 1 10 Dermatologic / Skin Assessment (not related to wound area) ASSESSMENTS - Focused Assessment X - Circumferential Edema Measurements - multi extremities 1 5 []  - 0 Nutritional Assessment / Counseling / Intervention X- 1 5 Lower Extremity Assessment (monofilament, tuning fork, pulses) []  - 0 Peripheral Arterial Disease Assessment (using hand held doppler) ASSESSMENTS - Ostomy and/or Continence Assessment and Care []  - Incontinence Assessment and Management 0 []  - 0 Ostomy Care Assessment and Management (repouching, etc.) PROCESS - Coordination of Care X - Simple Patient / Family Education for ongoing care 1 15 []  - 0 Complex (extensive) Patient / Family Education for ongoing care X- 1 10 Staff obtains Programmer, systems, Records, Test Results / Process Orders []  - 0 Staff telephones HHA, Nursing Homes / Clarify orders / etc []  - 0 Routine Transfer to another Facility (non-emergent condition) []  - 0 Routine Hospital Admission (non-emergent condition) []  - 0 New Admissions / Biomedical engineer / Ordering NPWT, Apligraf, etc. []  - 0 Emergency Hospital Admission (emergent condition) X- 1 10 Simple Discharge Coordination []  - 0 Complex (extensive) Discharge Coordination PROCESS - Special Needs []  - Pediatric / Minor Patient Management 0 []  - 0 Isolation Patient Management []  - 0 Hearing / Language / Visual special needs []  - 0 Assessment of Community assistance (transportation, D/C planning, etc.) []  - 0 Additional assistance / Altered mentation []  - 0 Support Surface(s) Assessment (bed, cushion, seat, etc.) INTERVENTIONS - Wound Cleansing /  Measurement [employer], Martina C. ([id number]) X- 1 5 Simple Wound Cleansing - one wound []  - 0 Complex Wound Cleansing - multiple wounds X- 1 5 Wound Imaging (  photographs - any number of wounds) []  - 0 Wound Tracing (instead of photographs) X- 1 5 Simple Wound Measurement - one wound []  - 0 Complex Wound Measurement - multiple wounds INTERVENTIONS - Wound Dressings []  - Small Wound Dressing one or multiple wounds 0 []  - 0 Medium Wound Dressing one or multiple wounds X- 1 20 Large Wound Dressing one or multiple wounds []  - 0 Application of Medications - topical []  - 0 Application of Medications - injection INTERVENTIONS - Miscellaneous []  - External ear exam 0 []  - 0 Specimen Collection (cultures, biopsies, blood, body fluids, etc.) []  - 0 Specimen(s) / Culture(s) sent or taken to Lab for analysis []  - 0 Patient Transfer (multiple staff / Civil Service fast streamer / Similar devices) []  - 0 Simple Staple / Suture removal (25 or less) []  - 0 Complex Staple / Suture removal (26 or more) []  - 0 Hypo / Hyperglycemic Management (close monitor of Blood Glucose) []  - 0 Ankle / Brachial Index (ABI) - do not check if billed separately X- 1 5 Vital Signs Has the patient been seen at the hospital within the last three years: Yes Total Score: 115 Level Of Care: New/Established - Level 3 Electronic Signature(s) Signed: 12/29/2019 4:36:59 PM By: Montey Hora Entered By: Montey Hora on 12/29/2019 08:38:00 Favata, [name] ([id number]) -------------------------------------------------------------------------------- Encounter Discharge Information Details Patient Name: [name]. Date of Service: 12/29/2019 8:00 AM Medical Record Number: [id number] Patient Account Number: [id number] Date of Birth/Sex: [birth year] (64 y.o. F) Treating RN: Montey Hora Primary Care Samreet Eden[place]: Tandy Gaw Other Clinician: Referring Twila Rappa: Tandy Gaw Treating Aeden Matranga/Extender: Melburn Hake, HOYT Weeks  in Treatment: 4 Encounter Discharge Information Items Discharge Condition: Stable Ambulatory Status: Ambulatory Discharge Destination: Home Transportation: Private Auto Accompanied By: self Schedule Follow-up Appointment: Yes Clinical Summary of Care: Electronic Signature(s) Signed: 12/29/2019 4:36:59 PM By: Montey Hora Entered By: Montey Hora on 12/29/2019 08:38:58 [place]umb, [name] ([id number]) -------------------------------------------------------------------------------- Lower Extremity Assessment Details Patient Name: [name]. Date of Service: 12/29/2019 8:00 AM Medical Record Number: [id number] Patient Account Number: [id number] Date of Birth/Sex: Nov 19, 1955 (64 y.o. F) Treating RN: Montey Hora Primary Care Ismelda Weatherman: Tandy Gaw Other Clinician: Referring Khayden Herzberg: Tandy Gaw Treating Floris Neuhaus/Extender: STONE III, HOYT Weeks in Treatment: 4 Edema Assessment Assessed: [Left: No] [Right: No] Edema: [Left: Ye] [Right: s] Calf Left: Right: Point of Measurement: 34 cm From Medial Instep cm 92.5 cm Ankle Left: Right: Point of Measurement: 13 cm From Medial Instep cm 49 cm Vascular Assessment Pulses: Dorsalis Pedis Palpable: [Right:Yes] Electronic Signature(s) Signed: 12/29/2019 4:36:59 PM By: Montey Hora Entered By: Montey Hora on 12/29/2019 08:33:08 Gick, [name] ([id number]) -------------------------------------------------------------------------------- Multi Wound Chart Details Patient Name: [name]. Date of Service: 12/29/2019 8:00 AM Medical Record Number: [id number] Patient Account Number: [id number] Date of Birth/Sex: [birth year] (64 y.o. F) Treating RN: Montey Hora Primary Care Chanise Habeck: Tandy Gaw Other Clinician: Referring Lia Vigilante: Tandy Gaw Treating Hermine Feria/Extender: STONE III, HOYT Weeks in Treatment: 4 Vital Signs Height(in): Pulse(bpm): 60 Weight(lbs): Blood Pressure(mmHg): 145/80 Body Mass  Index(BMI): Temperature(F): 98.3 Respiratory Rate(breaths/min): 16 Photos: [7:No Photos] [N/A:N/A] Wound Location: [7:Right, Lateral Lower Leg] [N/A:N/A] Wounding Event: [7:Gradually Appeared] [N/A:N/A] Primary Etiology: [7:Lymphedema] [N/A:N/A] Comorbid History: [7:Cataracts, Lymphedema, Asthma, Sleep Apnea, Tuberculosis, Hypertension, Gout, Osteoarthritis] [N/A:N/A] Date Acquired: [7:12/29/2019] [N/A:N/A] Weeks of Treatment: [7:0] [N/A:N/A] Wound Status: [7:Open] [N/A:N/A] Measurements L x W x D (cm) [7:8.5x8.5x0.1] [N/A:N/A] Area (cm) : [7:56.745] [N/A:N/A] Volume (cm) : [7:5.675] [N/A:N/A] % Reduction in Area: [7:0.00%] [N/A:N/A] % Reduction in Volume: [7:0.00%] [N/A:N/A]  Classification: [7:Full Thickness Without Exposed Support Structures] [N/A:N/A] Exudate Amount: [7:Large] [N/A:N/A] Exudate Type: [7:Purulent] [N/A:N/A] Exudate Color: [7:yellow, brown, green] [N/A:N/A] Wound Margin: [7:Indistinct, nonvisible] [N/A:N/A] Granulation Amount: [7:Medium (34-66%)] [N/A:N/A] Granulation Quality: [7:Pink] [N/A:N/A] Necrotic Amount: [7:Medium (34-66%)] [N/A:N/A] Exposed Structures: [7:Fat Layer (Subcutaneous Tissue) Exposed: Yes Fascia: No Tendon: No Muscle: No Joint: No Bone: No Medium (34-66%)] [N/A:N/A N/A] Treatment Notes Electronic Signature(s) Signed: 12/29/2019 4:36:59 PM By: Montey Hora Entered By: Montey Hora on 12/29/2019 08:33:21 [place]usted, [name] ([id number]) -------------------------------------------------------------------------------- [place] Details Patient Name: [name]. Date of Service: 12/29/2019 8:00 AM Medical Record Number: [id number] Patient Account Number: [id number] Date of Birth/Sex: 11/03/1955 (64 y.o. F) Treating RN: Montey Hora Primary Care Caelan Atchley: Tandy Gaw Other Clinician: Referring Ceclia Koker: Tandy Gaw Treating Mishelle Hassan/Extender: STONE III, HOYT Weeks in Treatment: 4 Active Inactive Abuse / Safety /  Falls / Self Care Management Nursing Diagnoses: Potential for falls Goals: Patient will remain injury free related to falls Date Initiated: 11/27/2019 Target Resolution Date: 02/13/2020 Goal Status: Active Interventions: Assess fall risk on admission and as needed Notes: Orientation to the Wound Care Program Nursing Diagnoses: Knowledge deficit related to the wound healing center program Goals: Patient/caregiver will verbalize understanding of the [facility] Date Initiated: 11/27/2019 Target Resolution Date: 02/13/2020 Goal Status: Active Interventions: Provide education on orientation to the wound center Notes: Venous Leg Ulcer Nursing Diagnoses: Potential for venous Insuffiency (use before diagnosis confirmed) Goals: Patient will maintain optimal edema control Date Initiated: 11/27/2019 Target Resolution Date: 02/13/2020 Goal Status: Active Interventions: Compression as ordered Notes: Electronic Signature(s) Signed: 12/29/2019 4:36:59 PM By: Montey Hora Entered By: Montey Hora on 12/29/2019 08:33:14 [place], [name] ([id number]) -------------------------------------------------------------------------------- Pain Assessment Details Patient Name: [name]. Date of Service: 12/29/2019 8:00 AM Medical Record Number: [id number] Patient Account Number: [id number] Date of Birth/Sex: 06-26-1956 (64 y.o. F) Treating RN: Cornell Barman Primary Care Kada Friesen: Tandy Gaw Other Clinician: Referring Jenne Sellinger: Tandy Gaw Treating Dyesha Henault/Extender: STONE III, HOYT Weeks in Treatment: 4 Active Problems Location of Pain Severity and Description of Pain Patient Has Paino No Site Locations Pain Management and Medication Current Pain Management: Electronic Signature(s) Signed: 12/29/2019 11:36:00 AM By: Sandre Kitty Signed: 12/29/2019 1:28:57 PM By: Gretta Cool, BSN, RN, CWS, Kim RN, BSN Entered By: Sandre Kitty on 12/29/2019 08:11:38 [name]  ([id number]) -------------------------------------------------------------------------------- Patient/Caregiver Education Details Patient Name: [name]. Date of Service: 12/29/2019 8:00 AM Medical Record Number: [id number] Patient Account Number: [id number] Date of Birth/Gender: 1955-10-23 (64 y.o. F) Treating RN: Montey Hora Primary Care Physician: Tandy Gaw Other Clinician: Referring Physician: Tandy Gaw Treating Physician/Extender: Melburn Hake, HOYT Weeks in Treatment: 4 Education Assessment Education Provided To: Patient Education Topics Provided Venous: Handouts: Other: continue edema management Methods: Explain/Verbal Responses: State content correctly Electronic Signature(s) Signed: 12/29/2019 4:36:59 PM By: Montey Hora Entered By: Montey Hora on 12/29/2019 08:38:21 [place], [name] ([id number]) -------------------------------------------------------------------------------- Wound Assessment Details Patient Name: [name] Date of Service: 12/29/2019 8:00 AM Medical Record Number: [id number] Patient Account Number: [id number] Date of Birth/Sex: Dec 08, 1955 (64 y.o. F) Treating RN: Cornell Barman Primary Care Esaias Cleavenger: Tandy Gaw Other Clinician: Referring Zillah Alexie: Tandy Gaw Treating Matthewjames Petrasek/Extender: STONE III, HOYT Weeks in Treatment: 4 Wound Status Wound Number: 7 Primary Lymphedema Etiology: Wound Location: Right, Lateral Lower Leg Wound Open Wounding Event: Gradually Appeared Status: Date Acquired: 12/29/2019 Comorbid Cataracts, Lymphedema, Asthma, Sleep Apnea, Weeks Of Treatment: 0 History: Tuberculosis, Hypertension, Gout, Osteoarthritis Clustered Wound: No Photos Wound Measurements Length: (cm) 8.5 Width: (cm) 8.5 Depth: (  cm) 0.1 Area: (cm) 56.745 Volume: (cm) 5.675 % Reduction in Area: 0% % Reduction in Volume: 0% Epithelialization: Medium (34-66%) Tunneling: No Undermining: No Wound Description Classification:  Full Thickness Without Exposed Support Structures Wound Margin: Indistinct, nonvisible Exudate Amount: Large Exudate Type: Purulent Exudate Color: yellow, brown, green Foul Odor After Cleansing: No Slough/Fibrino Yes Wound Bed Granulation Amount: Medium (34-66%) Exposed Structure Granulation Quality: Pink Fascia Exposed: No Necrotic Amount: Medium (34-66%) Fat Layer (Subcutaneous Tissue) Exposed: Yes Necrotic Quality: Adherent Slough Tendon Exposed: No Muscle Exposed: No Joint Exposed: No Bone Exposed: No Treatment Notes Wound #7 (Right, Lateral Lower Leg) Notes abd, kerlix, ace wraps Electronic Signature(s) Signed: 12/29/2019 11:36:00 AM By: Elba Barman ([id number]) Signed: 12/29/2019 1:28:57 PM By: Gretta Cool, BSN, RN, CWS, Kim RN, BSN Entered By: Sandre Kitty on 12/29/2019 10:18:01 [name] ([id number]) -------------------------------------------------------------------------------- Vitals Details Patient Name: [name]. Date of Service: 12/29/2019 8:00 AM Medical Record Number: [id number] Patient Account Number: [id number] Date of Birth/Sex: 04-13-1956 (64 y.o. F) Treating RN: Cornell Barman Primary Care Dali Kraner: Tandy Gaw Other Clinician: Referring Isiaah Cuervo: Tandy Gaw Treating Pelagia Iacobucci/Extender: STONE III, HOYT Weeks in Treatment: 4 Vital Signs Time Taken: 08:11 Temperature (F): 98.3 Pulse (bpm): 74 Respiratory Rate (breaths/min): 16 Blood Pressure (mmHg): 145/80 Reference Range: 80 - 120 mg / dl Electronic Signature(s) Signed: 12/29/2019 11:36:00 AM By: Sandre Kitty Entered By: Sandre Kitty on 12/29/2019 08:11:32

## 2019-12-30 ENCOUNTER — Other Ambulatory Visit
Admission: RE | Admit: 2019-12-30 | Discharge: 2019-12-30 | Disposition: A | Discharge status: Home / Self Care | Payer: Medicaid Other | Source: Ambulatory Visit | Attending: Physician Assistant | Admitting: Physician Assistant

## 2019-12-30 DIAGNOSIS — L089 Local infection of the skin and subcutaneous tissue, unspecified: Secondary | ICD-10-CM | POA: Insufficient documentation

## 2020-01-01 LAB — AEROBIC CULTURE W GRAM STAIN (SUPERFICIAL SPECIMEN)

## 2020-01-12 ENCOUNTER — Ambulatory Visit: Payer: Medicaid Other | Admitting: Physician Assistant

## 2020-01-14 ENCOUNTER — Ambulatory Visit: Payer: Medicaid Other | Admitting: Physician Assistant

## 2020-01-22 ENCOUNTER — Other Ambulatory Visit: Payer: Self-pay

## 2020-01-22 ENCOUNTER — Encounter: Payer: Medicaid Other | Attending: Internal Medicine | Admitting: Internal Medicine

## 2020-01-22 DIAGNOSIS — L03115 Cellulitis of right lower limb: Secondary | ICD-10-CM | POA: Insufficient documentation

## 2020-01-22 DIAGNOSIS — Q820 Hereditary lymphedema: Secondary | ICD-10-CM | POA: Diagnosis not present

## 2020-01-22 DIAGNOSIS — E669 Obesity, unspecified: Secondary | ICD-10-CM | POA: Diagnosis not present

## 2020-01-22 DIAGNOSIS — E11622 Type 2 diabetes mellitus with other skin ulcer: Secondary | ICD-10-CM | POA: Diagnosis not present

## 2020-01-22 DIAGNOSIS — L97811 Non-pressure chronic ulcer of other part of right lower leg limited to breakdown of skin: Secondary | ICD-10-CM | POA: Diagnosis not present

## 2020-01-22 DIAGNOSIS — I10 Essential (primary) hypertension: Secondary | ICD-10-CM | POA: Insufficient documentation

## 2020-01-22 DIAGNOSIS — Z886 Allergy status to analgesic agent status: Secondary | ICD-10-CM | POA: Insufficient documentation

## 2020-01-22 DIAGNOSIS — G4733 Obstructive sleep apnea (adult) (pediatric): Secondary | ICD-10-CM | POA: Diagnosis not present

## 2020-01-22 DIAGNOSIS — L97821 Non-pressure chronic ulcer of other part of left lower leg limited to breakdown of skin: Secondary | ICD-10-CM | POA: Diagnosis not present

## 2020-01-22 DIAGNOSIS — Z888 Allergy status to other drugs, medicaments and biological substances status: Secondary | ICD-10-CM | POA: Diagnosis present

## 2020-01-27 NOTE — Progress Notes (Signed)
ANNAKA, CLEAVER ([id number]) Visit Report for 01/22/2020 Arrival Information Details Patient Name: [name], [name]. Date of Service: 01/22/2020 10:15 AM Medical Record Number: [id number] Patient Account Number: [id number] Date of Birth/Sex: 08-Feb-1956 (64 y.o. F) Treating RN: Army Melia Primary Care Geo Slone: Tandy Gaw Other Clinician: Referring Nahun Kronberg: Tandy Gaw Treating Sherah Lund/Extender: Tito Dine in Treatment: 8 Visit Information History Since Last Visit Added or deleted any medications: No Patient Arrived: Cane Any new allergies or adverse reactions: No Arrival Time: 10:07 Had a fall or experienced change in No Accompanied By: self activities of daily living that may affect Transfer Assistance: None risk of falls: Patient Identification Verified: Yes Signs or symptoms of abuse/neglect since last visito No Hospitalized since last visit: No Has Dressing in Place as Prescribed: Yes Pain Present Now: No Electronic Signature(s) Signed: 01/22/2020 11:01:38 AM By: Army Melia Entered By: Army Melia on 01/22/2020 10:07:49 Shed, [name] ([id number]) -------------------------------------------------------------------------------- Clinic Level of Care Assessment Details Patient Name: [name]. Date of Service: 01/22/2020 10:15 AM Medical Record Number: [id number] Patient Account Number: [id number] Date of Birth/Sex: 04-10-1956 (64 y.o. F) Treating RN: Army Melia Primary Care Janita Camberos: Tandy Gaw Other Clinician: Referring Shailynn Fong: Tandy Gaw Treating Dayanira Giovannetti/Extender: Tito Dine in Treatment: 8 Clinic Level of Care Assessment Items TOOL 4 Quantity Score []  - Use when only an EandM is performed on FOLLOW-UP visit 0 ASSESSMENTS - Nursing Assessment / Reassessment X - Reassessment of Co-morbidities (includes updates in patient status) 1 10 X- 1 5 Reassessment of Adherence to Treatment Plan ASSESSMENTS - Wound and Skin  Assessment / Reassessment []  - Simple Wound Assessment / Reassessment - one wound 0 X- 2 5 Complex Wound Assessment / Reassessment - multiple wounds []  - 0 Dermatologic / Skin Assessment (not related to wound area) ASSESSMENTS - Focused Assessment []  - Circumferential Edema Measurements - multi extremities 0 []  - 0 Nutritional Assessment / Counseling / Intervention []  - 0 Lower Extremity Assessment (monofilament, tuning fork, pulses) []  - 0 Peripheral Arterial Disease Assessment (using hand held doppler) ASSESSMENTS - Ostomy and/or Continence Assessment and Care []  - Incontinence Assessment and Management 0 []  - 0 Ostomy Care Assessment and Management (repouching, etc.) PROCESS - Coordination of Care X - Simple Patient / Family Education for ongoing care 1 15 []  - 0 Complex (extensive) Patient / Family Education for ongoing care []  - 0 Staff obtains Programmer, systems, Records, Test Results / Process Orders []  - 0 Staff telephones HHA, Nursing Homes / Clarify orders / etc []  - 0 Routine Transfer to another Facility (non-emergent condition) []  - 0 Routine Hospital Admission (non-emergent condition) []  - 0 New Admissions / Biomedical engineer / Ordering NPWT, Apligraf, etc. []  - 0 Emergency Hospital Admission (emergent condition) X- 1 10 Simple Discharge Coordination []  - 0 Complex (extensive) Discharge Coordination PROCESS - Special Needs []  - Pediatric / Minor Patient Management 0 []  - 0 Isolation Patient Management []  - 0 Hearing / Language / Visual special needs []  - 0 Assessment of Community assistance (transportation, D/C planning, etc.) []  - 0 Additional assistance / Altered mentation []  - 0 Support Surface(s) Assessment (bed, cushion, seat, etc.) INTERVENTIONS - Wound Cleansing / Measurement Dante, Yaslyn C. ([id number]) []  - 0 Simple Wound Cleansing - one wound X- 2 5 Complex Wound Cleansing - multiple wounds X- 1 5 Wound Imaging (photographs - any number of  wounds) []  - 0 Wound Tracing (instead of photographs) []  - 0 Simple Wound Measurement - one wound X- 2 5  Complex Wound Measurement - multiple wounds INTERVENTIONS - Wound Dressings []  - Small Wound Dressing one or multiple wounds 0 []  - 0 Medium Wound Dressing one or multiple wounds X- 2 20 Large Wound Dressing one or multiple wounds []  - 0 Application of Medications - topical []  - 0 Application of Medications - injection INTERVENTIONS - Miscellaneous []  - External ear exam 0 []  - 0 Specimen Collection (cultures, biopsies, blood, body fluids, etc.) []  - 0 Specimen(s) / Culture(s) sent or taken to Lab for analysis []  - 0 Patient Transfer (multiple staff / Civil Service fast streamer / Similar devices) []  - 0 Simple Staple / Suture removal (25 or less) []  - 0 Complex Staple / Suture removal (26 or more) []  - 0 Hypo / Hyperglycemic Management (close monitor of Blood Glucose) []  - 0 Ankle / Brachial Index (ABI) - do not check if billed separately X- 1 5 Vital Signs Has the patient been seen at the hospital within the last three years: Yes Total Score: 120 Level Of Care: New/Established - Level 4 Electronic Signature(s) Signed: 01/22/2020 11:01:38 AM By: Army Melia Entered By: Army Melia on 01/22/2020 10:38:33 Goya, [name] ([id number]) -------------------------------------------------------------------------------- Encounter Discharge Information Details Patient Name: [name]. Date of Service: 01/22/2020 10:15 AM Medical Record Number: [id number] Patient Account Number: [id number] Date of Birth/Sex: [birth year] (64 y.o. F) Treating RN: Army Melia Primary Care Kianni Lheureux: Tandy Gaw Other Clinician: Referring Jasiel Belisle: Tandy Gaw Treating Octavious Zidek/Extender: Tito Dine in Treatment: 8 Encounter Discharge Information Items Discharge Condition: Stable Ambulatory Status: Cane Discharge Destination: Home Transportation: Private Auto Accompanied By:  self Schedule Follow-up Appointment: Yes Clinical Summary of Care: Electronic Signature(s) Signed: 01/22/2020 11:01:38 AM By: Army Melia Entered By: Army Melia on 01/22/2020 10:39:25 Ranganathan, [name] ([id number]) -------------------------------------------------------------------------------- Lower Extremity Assessment Details Patient Name: Elizabeth Landau C. Date of Service: 01/22/2020 10:15 AM Medical Record Number: [id number] Patient Account Number: [id number] Date of Birth/Sex: 05/03/1956 (64 y.o. F) Treating RN: Army Melia Primary Care Triva Hueber: Tandy Gaw Other Clinician: Referring Graham Doukas: Tandy Gaw Treating Armaan Pond/Extender: Ricard Dillon Weeks in Treatment: 8 Edema Assessment Assessed: [Left: No] [Right: No] Edema: [Left: Yes] [Right: Yes] Calf Left: Right: Point of Measurement: 34 cm From Medial Instep cm cm Ankle Left: Right: Point of Measurement: 13 cm From Medial Instep cm cm Vascular Assessment Pulses: Dorsalis Pedis Palpable: [Left:Yes] [Right:Yes] Electronic Signature(s) Signed: 01/22/2020 11:01:38 AM By: Army Melia Entered By: Army Melia on 01/22/2020 10:19:21 Lipkin, [name] ([id number]) -------------------------------------------------------------------------------- Multi Wound Chart Details Patient Name: [name]. Date of Service: 01/22/2020 10:15 AM Medical Record Number: [id number] Patient Account Number: [id number] Date of Birth/Sex: 1956-05-12 (64 y.o. F) Treating RN: Army Melia Primary Care Tiffiany Beadles: Tandy Gaw Other Clinician: Referring Daviyon Widmayer: Tandy Gaw Treating Jakobe Blau/Extender: Ricard Dillon Weeks in Treatment: 8 Vital Signs Height(in): Pulse(bpm): 63 Weight(lbs): Blood Pressure(mmHg): 157/85 Body Mass Index(BMI): Temperature(F): 98.1 Respiratory Rate(breaths/min): 16 Photos: [7:No Photos] [N/A:N/A] Wound Location: [7:Right, Lateral Lower Leg] [N/A:N/A] Wounding Event: [7:Gradually Appeared]  [N/A:N/A] Primary Etiology: [7:Lymphedema] [N/A:N/A] Date Acquired: [7:12/29/2019] [N/A:N/A] Weeks of Treatment: [7:3] [N/A:N/A] Wound Status: [7:Converted] [N/A:N/A] Measurements L x W x D (cm) [7:8.5x8.5x0.1] [N/A:N/A] Area (cm) : [7:56.745] [N/A:N/A] Volume (cm) : [7:5.675] [N/A:N/A] % Reduction in Area: [7:0.00%] [N/A:N/A] % Reduction in Volume: [7:0.00%] [N/A:N/A] Classification: [7:Full Thickness Without Exposed Support Structures] [N/A:N/A] Treatment Notes Electronic Signature(s) Signed: 01/27/2020 4:41:04 PM By: Linton Ham MD Entered By: Linton Ham on 01/22/2020 10:44:16 Garrette, [name] ([id number]) -------------------------------------------------------------------------------- [facility] Details Patient Name: [name],  [name] Date of Service: 01/22/2020 10:15 AM Medical Record Number: [id number] Patient Account Number: [id number] Date of Birth/Sex: September 19, 1955 (64 y.o. F) Treating RN: Army Melia Primary Care Jancarlos Thrun: Tandy Gaw Other Clinician: Referring Majour Frei: Tandy Gaw Treating Dalisa Forrer/Extender: Tito Dine in Treatment: 8 Active Inactive Abuse / Safety / Falls / Self Care Management Nursing Diagnoses: Potential for falls Goals: Patient will remain injury free related to falls Date Initiated: 11/27/2019 Target Resolution Date: 02/13/2020 Goal Status: Active Interventions: Assess fall risk on admission and as needed Notes: Orientation to the Wound Care Program Nursing Diagnoses: Knowledge deficit related to the wound healing center program Goals: Patient/caregiver will verbalize understanding of the [place] Program Date Initiated: 11/27/2019 Target Resolution Date: 02/13/2020 Goal Status: Active Interventions: Provide education on orientation to the wound center Notes: Venous Leg Ulcer Nursing Diagnoses: Potential for venous Insuffiency (use before diagnosis confirmed) Goals: Patient will  maintain optimal edema control Date Initiated: 11/27/2019 Target Resolution Date: 02/13/2020 Goal Status: Active Interventions: Compression as ordered Notes: Electronic Signature(s) Signed: 01/22/2020 11:01:38 AM By: Army Melia Entered By: Army Melia on 01/22/2020 10:33:44 Laymon, [name] ([id number]) -------------------------------------------------------------------------------- Non-Wound Condition Assessment Details Patient Name: [name]. Date of Service: 01/22/2020 10:15 AM Medical Record Number: [id number] Patient Account Number: [id number] Date of Birth/Sex: 1956-01-02 (64 y.o. F) Treating RN: Army Melia Primary Care Aylana Hirschfeld: Tandy Gaw Other Clinician: Referring Geneen Dieter: Tandy Gaw Treating Mayson Mcneish/Extender: Ricard Dillon Weeks in Treatment: 8 Non-Wound Condition: Condition: Lymphedema Location: Leg Side: Left Photos Electronic Signature(s) Signed: 01/22/2020 11:01:38 AM By: Army Melia Entered By: Army Melia on 01/22/2020 10:15:19 Heroux, [name] ([id number]) -------------------------------------------------------------------------------- Non-Wound Condition Assessment Details Patient Name: Elizabeth Landau C. Date of Service: 01/22/2020 10:15 AM Medical Record Number: [id number] Patient Account Number: [id number] Date of Birth/Sex: 12-02-1955 (64 y.o. F) Treating RN: Army Melia Primary Care Giomar Gusler: Tandy Gaw Other Clinician: Referring Lyden Redner: Tandy Gaw Treating Aloys Hupfer/Extender: Ricard Dillon Weeks in Treatment: 8 Non-Wound Condition: Condition: Lymphedema Location: Leg Side: Right Photos Electronic Signature(s) Signed: 01/22/2020 11:01:38 AM By: Army Melia Entered By: Army Melia on 01/22/2020 10:15:47 Goines, [name] ([id number]) -------------------------------------------------------------------------------- Pain Assessment Details Patient Name: Elizabeth Landau C. Date of Service: 01/22/2020 10:15 AM Medical  Record Number: [id number] Patient Account Number: [id number] Date of Birth/Sex: 1956/02/14 (64 y.o. F) Treating RN: Army Melia Primary Care Zanaria Morell: Tandy Gaw Other Clinician: Referring Stepfon Rawles: Tandy Gaw Treating Draxton Luu/Extender: Ricard Dillon Weeks in Treatment: 8 Active Problems Location of Pain Severity and Description of Pain Patient Has Paino No Site Locations Pain Management and Medication Current Pain Management: Electronic Signature(s) Signed: 01/22/2020 11:01:38 AM By: Army Melia Entered By: Army Melia on 01/22/2020 10:08:05 [name] ([id number]) -------------------------------------------------------------------------------- Patient/Caregiver Education Details Patient Name: [name]. Date of Service: 01/22/2020 10:15 AM Medical Record Number: [id number] Patient Account Number: [id number] Date of Birth/Gender: 01-Oct-1955 (64 y.o. F) Treating RN: Army Melia Primary Care Physician: Tandy Gaw Other Clinician: Referring Physician: Tandy Gaw Treating Physician/Extender: Tito Dine in Treatment: 8 Education Assessment Education Provided To: Patient Education Topics Provided Wound/Skin Impairment: Handouts: Caring for Your Ulcer Methods: Demonstration, Explain/Verbal Responses: State content correctly Electronic Signature(s) Signed: 01/22/2020 11:01:38 AM By: Army Melia Entered By: Army Melia on 01/22/2020 10:38:48 Kirchhoff, [name] ([id number]) -------------------------------------------------------------------------------- Wound Assessment Details Patient Name: Elizabeth Landau C. Date of Service: 01/22/2020 10:15 AM Medical Record Number: [id number] Patient Account Number: [id number] Date of Birth/Sex: October 16, 1955 (64 y.o. F) Treating RN: Army Melia Primary Care Grissel Tyrell: Tandy Gaw Other Clinician:  Referring Jovin Fester: Tandy Gaw Treating Shatonya Passon/Extender: Ricard Dillon Weeks in Treatment: 8 Wound  Status Wound Number: [id number] Primary Etiology: Lymphedema Wound Location: Right, Lateral Lower Leg Wound Status: Converted Wounding Event: Gradually Appeared Date Acquired: 12/29/2019 Weeks Of Treatment: 3 Clustered Wound: No Wound Measurements Length: (cm) 8.5 Width: (cm) 8.5 Depth: (cm) 0.1 Area: (cm) 56.745 Volume: (cm) 5.675 % Reduction in Area: 0% % Reduction in Volume: 0% Wound Description Classification: Full Thickness Without Exposed Support Structu res Electronic Signature(s) Signed: 01/22/2020 11:01:38 AM By: Army Melia Entered By: Army Melia on 01/22/2020 10:16:22 [name] ([id number]) -------------------------------------------------------------------------------- Vitals Details Patient Name: [name]. Date of Service: 01/22/2020 10:15 AM Medical Record Number: [id number] Patient Account Number: [id number] Date of Birth/Sex: 09/21/1955 (64 y.o. F) Treating RN: Army Melia Primary Care Morganne Haile: Tandy Gaw Other Clinician: Referring Ivelisse Culverhouse: Tandy Gaw Treating Jakyron Fabro/Extender: Ricard Dillon Weeks in Treatment: 8 Vital Signs Time Taken: 10:07 Temperature (F): 98.1 Pulse (bpm): 63 Respiratory Rate (breaths/min): 16 Blood Pressure (mmHg): 157/85 Reference Range: 80 - 120 mg / dl Electronic Signature(s) Signed: 01/22/2020 11:01:38 AM By: Army Melia Entered By: Army Melia on 01/22/2020 10:09:22

## 2020-01-27 NOTE — Progress Notes (Signed)
[name], [name] ([id number]) Visit Report for 01/22/2020 HPI Details Patient Name: [name], [name]. Date of Service: 01/22/2020 10:15 AM Medical Record Number: [id number] Patient Account Number: [id number] Date of Birth/Sex: 1955-07-20 (64 y.o. F) Treating RN: Cornell Barman Primary Care Provider: Tandy Gaw Other Clinician: Referring Provider: Tandy Gaw Treating Provider/Extender: Tito Dine in Treatment: 8 History of Present Illness HPI Description: The patient is a 64 year old female with history of hypertension and a long-standing history of bilateral lower extremity lymphedema (first presented on 4/2) . She has had open ulcers in the past which have always responded to compression therapy. She had briefly been to a lymphedema clinic in the past which helped her at the time. this time around she stopped treatment of her lymphedema pumps approximately 2 weeks ago because of some pain in the knees and then noticed the right leg getting worse. She was seen by her PCP who put her on clindamycin 4 times a day 2 days ago. The patient has seen [facility] and Dr. Delana Meyer had seen her last year where a vascular study including venous and arterial duplex studies were within normal limits. he had recommended compression stockings and lymphedema pumps and the patient has been using this in about 2 weeks ago. She is known to be diabetic but in the past few time she's gone to her primary care doctor her hemoglobin A1c has been normal. 02/11/2015 - after her last visit she took my advice and went to the ER regarding the progressive cellulitis of her right lower extremity and she was admitted between July 17 and 22nd. She received IV antibiotics and then was sent home on a course of steroid-induced and oral antibiotics. She has improved much since then. 02/17/2015 -- she has been doing fine and the weeping of her legs has remarkably gone down. She has no fresh issues. READMISSION 01/15/18 This  patient was given this clinic before most recently in 2016 seen by Dr. Con Memos. She has massive bilateral lymphedema and over the last 2 months this had weeping edema out of the left leg. She has compression pumps but her compliance with these has been minimal. She has [facility] they've been using TCA/ABDs/kerlix under an Ace wrap.she has had recent problems with cellulitis. She was apparently seen in the ER and 12/23/17 and given clindamycin. She was then followed by her primary doctor and given doxycycline and Keflex. The pain seems to have settled down. In April 2018 the patient had arterial studies done at [facility]. This showed triphasic waveforms throughout the right leg and mostly triphasic waveforms on the left except for monophasic at the posterior tibial artery distally. She was not felt to have evidence of right lower extremity arterial stenosis or significant problems on the left side. 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 She also had a right lower extremity venous Doppler in January 2018 this was limited by the patient's body habitus and lymphedema. Most of the proximal veins were not visualized The patient presents with an area of denuded skin on the anterior medial part of the left calf. There is weeping edema fluid here. 01/22/18; the patient has somewhat better edema control using her compression pumps twice a day and as a result she has much better epithelialization on the left anterior calf area. Only a small open area remains. 01/29/18; the patient has been compliant with her compression pumps. Both the areas on her calf that healed. The remaining area on  the left anterior leg is fully epithelialized Readmission: 02/20/2019 upon evaluation today patient presents for reevaluation due to issues that she is having with the bilateral lower extremities. She actually has wounds open on both legs. On the right she has an area in the  crease of her leg on the right around the knee region which is actually draining quite a bit and actually has some fungal type appearance to it. She has been on nystatin powder that seems to have helped to some degree. In regard to the left lower extremity this is actually in the lower portion of her leg closer to the ankle and again is continuing to drain as well unfortunately. There does not appear to be any signs of active infection at this time which is good news. No fevers, chills, nausea, vomiting, or diarrhea. She tells me that since she was seen last year she is actually been doing quite well for the most part with regard to her lower extremities. Unfortunately she now is experiencing a little bit more drainage at this time. She is concerned about getting this under control so that it does not get significantly worse. 02/27/2019 on evaluation today patient appears to be doing somewhat better in regard to her bilateral lower extremity wounds. She has been tolerating the dressing changes without complication. Fortunately there is no signs of active infection at this point. No fevers, chills, nausea, vomiting, or diarrhea. She did get her dressing supplies which is excellent news she was extremely excited to get these. She also got paperwork from [employer] for their financial assistance program where they may be able to help her out in the future if needed with supplies at discounted prices. 03/06/2019 on evaluation today patient appears to be doing a little worse with regard to both areas of weeping on her bilateral lower extremities. This is around the right medial knee and just above the left ankle. With that being said she is unfortunately not doing as well as I would like to see. I feel like she may need to potentially go see someone at the lymphedema clinic as the wraps that she needs or even beyond what we can do here at the wound care center. She really does not have wounds she just has open  areas of weeping that are causing some difficulty for her. Subsequently because of this and the moisture I am concerned about the potential for infection I am going to likely give her a prophylactic antibiotic today, Keflex, just to be on the safe side. Nonetheless again there is no obvious signs of active infection at this time. FREDERIKA, HUKILL ([id number]) 03/13/2019 on evaluation today patient appears to be doing well with regard to her bilateral lower extremities where she has been weeping compared to even last week's evaluation. I see some areas of new skin growth which is excellent and overall I am very pleased with how things seem to be progressing. No fevers, chills, nausea, vomiting, or diarrhea. 03/20/2019 on evaluation today patient unfortunately is continuing to have issues with significant edema of the left lower extremity. Her right side seems to be doing much better. Unfortunately her left side is showing increased weeping of the lower portion of her leg. This is quite unfortunate obviously we were hoping to get her into the lymphedema clinic they really do not seem to when I see her how if she is draining. Despite the fact this is really not wound related but more lymphedema weeping related. Nonetheless I do  not know that this can be helpful for her to even go for that appointment since again I am not sure there is much that they would actually do at this point. We may need to try a 4 layer compression wrap as best we can on her leg. She is on the Augmentin currently although I am still concerned about whether or not there could be potentially something going on infection wise I would obtain a culture though I understand is not the best being that is a surface culture I just 1 to make sure I do not seem to be missing anything. 03/27/2019 on evaluation today patient appears to be doing much better in regard to the left lower extremity compared to last week. Last week she had tremendous  weeping which I think was subsequent to infection now she seems to be doing much better and very pleased. This is not completely healed but there is a lot of new skin growth and it has dried out quite a bit. Overall I think that we are doing well with how things are moving along at this time. No fevers, chills, nausea, vomiting, or diarrhea. 04/03/2019 evaluation today patient appears to be doing a little worse this week compared to last time I saw her. I think this may be due to the fact that she is having issues with not being able to sleep in her bed at least not until last night. She is therefore been in a lift chair and subsequently has also had issues with not been able to use her pumps since she could not get in bed. With that being said the patient overall seems to be doing okay I do think I may want extend the antibiotic for a little bit longer at least until we can see if her edema and her weeping gets better and if it is then obviously I can always discontinue the antibiotics as of next week however I want her to continue to have it over the next week. 04/10/2019 on evaluation today patient unfortunately is still doing poorly with regard to her left lower extremity. Her right is all things considering doing fairly well. On the left however she continues to have spreading of the area of infection and weeping which appears to be even a larger surface area than noted last week. She did have a positive culture for Pseudomonas in particular which seems to have been of concern she still has green/yellow discharge consistent with Pseudomonas and subsequently a tremendous amount of it. This has me obviously still concerned about the infection not really clearing up despite the fact that on culture it appears the Cipro should have been a good option for treating this. I think she may at this point need IV antibiotics since things are not doing better I do not want to get worse and cause sepsis. She is  in agreement with the plan and believes as well that she likely does need to go to the hospital for IV vancomycin. Or something of the like depending on what the recommendation is from the ER. 04/17/2019 on evaluation today patient appears to be doing excellent in regard to her lower extremity on the left. She was in the hospital for several days from when I sent her last we saw her until just this past Tuesday. Fortunately her drainage is significantly improved and in fact is mostly clear. There is just a couple small areas that may still drain a little bit she states that the  Omnicef they prescribed for her at discharge she went picked up from pharmacy and got home but has not been able to find it since. She is looked everywhere. She is wondering if I will replace that for her today I will be more than happy to do that. 05/01/2019 on evaluation today patient actually appears to be doing quite well with regard to her lower extremities. She occasionally is having areas that will leak and then heal up mainly when a piece of the fibrotic skin pops off but fortunately she is not having any signs of active infection at this time. Overall she also really does not have any obvious weeping at this time. I do believe however she really needs some compression wraps and I think this may be a good time to get her back to the lymphedema clinic. 05/11/2019 on evaluation today patient actually appears to be doing quite well with regard to her bilateral lower extremities. She occasionally will have a small area that we per another but in general seems to be completely healed which is great news. Overall very pleased with how everything seems to be progressing. She does have her appointment with lymphedema clinic on November 18. 05/25/2019 on evaluation today patient appears to be doing well with regard to her left lower extremity. I am very pleased in this regard. In regard to her right leg this actually did start  draining more I think it is mainly due to the fact that her leg is more swollen. I am not seeing any obvious signs of infection at this time although that is definitely something were obviously acutely aware of simply due to the fact that she had an issue not too far back with exactly this issue. Nonetheless I do feel like that lymphedema clinic would still be beneficial for her. I explained obviously if they are not able to do anything treatment wise on the right leg we could at least have them treat her left leg and then proceed from there. The patient is really in agreement with that plan. If they are able to do both as the drainage slows down that I would be happy to let them handle both. 06/01/2019 on evaluation today patient unfortunately appears to be doing worse with regard to her right lower extremity. The left lower extremity is still maintaining at this point. Unfortunately she has been having significantly increased pain over the past several days and has been experiencing as well increased swelling of the right lower extremity. I really do not know that I am seeing anything that appears to be obvious for infection at this point to be peripherally honest. With that being said the patient does seem to be having much more swelling that she is even experienced in the past and coupled with increased pain in her hip as well I am concerned that again she could potentially have a DVT although I am not 100% sure of this. I think it something that may need to be checked out. We discussed the possibility of sending her for a DVT study through the hospital but unfortunately transportation is an issue if she does have a DVT I do not want her to wait days to be able to get in for that test however if she has this scheduled as an outpatient that is as fast that she will be able to get the test scheduled for transportation purposes. That will also fall on Thanksgiving so subsequently she did actually be  looking at either  Friday or even next week before we would know anything back from this. That is much too long in my opinion. Subsequent to the amount of discomfort she is experiencing the patient is actually okay with going to the ER for evaluation today. 06/12/2019 on evaluation today patient actually appears to be doing significantly better compared to last time I saw her. Following when I last saw her she was actually in the hospital from that Monday until the following Sunday almost 1 full week. She actually was placed on Keflex in the hospital following the time for her to be discharged and Dr. Steva Ready has recommended 2 times a day dosing of the Keflex for the next year in order to help with more prophylactic/preventative measures with regard to her developing cellulitis. Overall I think this sounds like an excellent plan. The patient unfortunately is good to have trouble being treated at lymphedema clinic due to the fact that she really cannot get up on the bed that they have there. They also state that they cannot manage her as long as she has anything draining at this point. Obviously that is somewhat unfortunate as she does need help with edema control but nonetheless we will have to do what we can for her outside of it sounds like the lymphedema clinic scenario at this point. 06/19/2019 on evaluation today patient appears to be doing fairly well with regard to her bilateral lower extremities. She is not nearly as swollen and shows no signs of infection at this point. There is no evidence of cellulitis whatsoever. She also has no open wounds or draining at this point DEVONNA, OBOYLE. ([id number]) which is also good news. No fever chills noted. She seems to be in very good spirits and in fact appears to be doing quite well. READMISSION 11/27/2019 This is a 64 year old woman that we have had in this clinic several times before including 2015, 16 and 19 and then most recently from 03/20/2019  through 06/19/2019 with bilateral lower extremity lymphedema. She has had previous arterial and reflux studies done years ago which were not all that remarkable. 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 She does have a positive family history and she had large legs starting may be in her 36s. She was recently in hospital from 10/20/2019 through 10/28/2019 with right leg cellulitis. She was given Ancef and clindamycin and then Zosyn when a culture showed Pseudomonas. At that time there was purulent drainage. She was followed by infectious disease Dr Steva Ready. The patient is now back at home. She has noted increased swelling in the right and no drainage in her right leg mostly on the posterior medial aspect in the calf area. She has not had pain or fever. She has literally been improved lysing above dressings because her at the area of this is far too large for standard compression. She has been wrapping the areas with sheets to resorptive pads. She is found these helped somewhat. She does have an appointment with the lymphedema clinic in [place] in late June. Past medical history includes bilateral lymphedema, hypertension, obstructive sleep apnea with CPAP. Recent hospitalization with apparently Pseudomonas cellulitis of the right lower leg 12/15/2019 upon evaluation today patient appears to be doing a little bit worse in regard to her right lower extremity. Unfortunately she is having more weeping down in the lower portion of her leg. Fortunately there is no signs of active infection at this time. No fever chills  noted. The patient states she is not having increased pain except for when she attempted to use the lymphedema pumps unfortunately she states that she did have pain when she did this. Otherwise we been using absorptive dressings of one type or another she is using diapers at home and then subsequently Ace wraps. In regard to the  barrier cream we have discussed the possibility of derma cloud which she would like to try I do not have a problem with that. 12/22/2019 upon evaluation today patient actually appears to be doing better in regard to her leg ulcers at this point. Fortunately there does not appear to be any signs of active infection which is great news and I am extremely pleased with where things are progressing at this time. There is no sign of active infection currently. The patient is very pleased to see things doing so well. 12/29/2019 upon evaluation today patient appears to be doing a little bit better in regard to her weeping in general over her lower extremities. She does have some signs of mild erythema little bit more than what I noted last week or rather last visit. Nonetheless I think that my threshold for switching her antibiotics from Keflex to something else is very low at this point considering that she has had such severe infections in the past that seem to come almost out of nowhere. There is a little erythema and warmth noted of the lower portion of her leg compared to the upper which also makes me want to go ahead and address things more rapidly at this point. Likely I would switch out the Keflex for something like Levaquin ideally. 7/16; patient with severe bilateral lymphedema. She has superficial wounds albeit almost circumferential now on the left lateral lower leg. This may be new from last time. Small area on the right anterior lower leg and then another area on the right medial lower leg and of pannus fold. She has been using various absorptive garments. She states she is using her compression pumps once a day occasionally twice. Culture from her last visit here was negative Electronic Signature(s) Signed: 01/27/2020 4:41:04 PM By: Linton Ham MD Entered By: Linton Ham on 01/22/2020 10:56:18 Nylund, [name]  ([id number]) -------------------------------------------------------------------------------- Physical Exam Details Patient Name: MARTITA, BRUMM C. Date of Service: 01/22/2020 10:15 AM Medical Record Number: [id number] Patient Account Number: [id number] Date of Birth/Sex: 28-Dec-1955 (64 y.o. F) Treating RN: Cornell Barman Primary Care Provider: Tandy Gaw Other Clinician: Referring Provider: Tandy Gaw Treating Provider/Extender: Ricard Dillon Weeks in Treatment: 8 Constitutional Patient is hypertensive.. Pulse regular and within target range for patient.Marland Kitchen Respirations regular, non-labored and within target range.. Temperature is normal and within the target range for the patient.Marland Kitchen appears in no distress. Respiratory Respiratory effort is easy and symmetric bilaterally. Rate is normal at rest and on room air.. Cardiovascular Difficult to feel her pulses but her feet are warm and appear to be well-perfused. Massive lower extremity edema.Marland Kitchen Psychiatric No evidence of depression, anxiety, or agitation. Calm, cooperative, and communicative. Appropriate interactions and affect.. Notes Wound exam oThe most problem part here is the left lower leg. She has almost developing circumferential epithelial loss mostly medially but anteriorly and developing posteriorly. Very uncomfortable for the patient. There is no evidence of surrounding cellulitis oShe has a loss of surface epithelium on the right anterior lower leg but also on area in the medial calf and between 2 hip pannus folds. Electronic Signature(s) Signed: 01/27/2020 4:41:04 PM By: Linton Ham MD Entered  By: Linton Ham on 01/22/2020 10:52:15 Boggio, [name] ([id number]) -------------------------------------------------------------------------------- Physician Orders Details Patient Name: [name] Date of Service: 01/22/2020 10:15 AM Medical Record Number: [id number] Patient Account Number: [id number] Date of  Birth/Sex: 1956-05-28 (64 y.o. F) Treating RN: Army Melia Primary Care Provider: Tandy Gaw Other Clinician: Referring Provider: Tandy Gaw Treating Provider/Extender: Tito Dine in Treatment: 8 Verbal / Phone Orders: No Diagnosis Coding Secondary Dressing o ABD and Kerlix/Conform - absorptive dressing of choice o XtraSorb Dressing Change Frequency o Change dressing every day. - and as needed Follow-up Appointments o Return Appointment in 1 week. Edema Control o Elevate legs to the level of the heart and pump ankles as often as possible o Compression Pump: Use compression pump on left lower extremity for 60 minutes, twice daily. o Compression Pump: Use compression pump on right lower extremity for 60 minutes, twice daily. o Other: - ACE wraps Electronic Signature(s) Signed: 01/22/2020 11:01:38 AM By: Army Melia Signed: 01/27/2020 4:41:04 PM By: Linton Ham MD Entered By: Army Melia on 01/22/2020 10:37:53 Strada, [name] ([id number]) -------------------------------------------------------------------------------- Problem List Details Patient Name: TRAVIA, ONSTAD. Date of Service: 01/22/2020 10:15 AM Medical Record Number: [id number] Patient Account Number: [id number] Date of Birth/Sex: Sep 15, 1955 (64 y.o. 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 Date of Service: 01/22/2020 10:15 AM Medical Record Number: [id number] Patient Account Number: [id number] Date of Birth/Sex: [birth year] (64 y.o. F) Treating RN: Cornell Barman Primary Care Provider: Tandy Gaw Other Clinician: Referring Provider: Tandy Gaw Treating Provider/Extender: Tito Dine in Treatment: 8 Subjective History of Present Illness (HPI) The patient is a 64 year old female with history of hypertension and a long-standing history of bilateral lower extremity lymphedema (first presented on 4/2) . She has had open ulcers in the past which have always responded to compression therapy. She had briefly been to a lymphedema clinic in the past which helped her at the time. this time around she stopped treatment of her lymphedema pumps approximately 2 weeks ago because of some pain in the knees and then noticed the right leg getting worse. She was seen by her PCP who put her on clindamycin 4 times a day 2 days ago. The patient has seen [facility] and Dr. Delana Meyer had seen her last year where a vascular study including venous and arterial duplex studies were within normal limits. he had recommended compression stockings and lymphedema pumps and the patient has been using this in about 2 weeks ago. She is known to be diabetic but in the past few time she's gone to her primary care doctor her hemoglobin A1c has been normal. 02/11/2015 - after her last visit she took my advice and went to the ER regarding the progressive cellulitis of her right lower extremity and she was admitted between July 17 and 22nd. She received IV antibiotics and then was sent home on a course  of steroid-induced and oral antibiotics. She has improved much since then. 02/17/2015 --  she has been doing fine and the weeping of her legs has remarkably gone down. She has no fresh issues. READMISSION 01/15/18 This patient was given this clinic before most recently in 2016 seen by Dr. Con Memos. She has massive bilateral lymphedema and over the last 2 months this had weeping edema out of the left leg. She has compression pumps but her compliance with these has been minimal. She has [facility] they've been using TCA/ABDs/kerlix under an Ace wrap.she has had recent problems with cellulitis. She was apparently seen in the ER and 12/23/17 and given clindamycin. She was then followed by her primary doctor and given doxycycline and Keflex. The pain seems to have settled down. In April 2018 the patient had arterial studies done at  [facility]. This showed triphasic waveforms throughout the right leg and mostly triphasic waveforms on the left except for monophasic at the posterior tibial artery distally. She was not felt to have evidence of right lower extremity arterial stenosis or significant problems on the left side. 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 She also had a right lower extremity venous Doppler in January 2018 this was limited by the patient's body habitus and lymphedema. Most of the proximal veins were not visualized The patient presents with an area of denuded skin on the anterior medial part of the left calf. There is weeping edema fluid here. 01/22/18; the patient has somewhat better edema control using her compression pumps twice a day and as a result she has much better epithelialization on the left anterior calf area. Only a small open area remains. 01/29/18; the patient has been compliant with her compression pumps. Both the areas on her calf that healed. The remaining area on the left anterior leg is fully epithelialized Readmission: 02/20/2019 upon evaluation today patient presents for reevaluation due to issues that she  is having with the bilateral lower extremities. She actually has wounds open on both legs. On the right she has an area in the crease of her leg on the right around the knee region which is actually draining quite a bit and actually has some fungal type appearance to it. She has been on nystatin powder that seems to have helped to some degree. In regard to the left lower extremity this is actually in the lower portion of her leg closer to the ankle and again is continuing to drain as well unfortunately. There does not appear to be any signs of active infection at this time which is good news. No fevers, chills, nausea, vomiting, or diarrhea. She tells me that since she was seen last year she is actually been doing quite well for the most part with regard to her lower extremities. Unfortunately she now is experiencing a little bit more drainage at this time. She is concerned about getting this under control so that it does not get significantly worse. 02/27/2019 on evaluation today patient appears to be doing somewhat better in regard to her bilateral lower extremity wounds. She has been tolerating the dressing changes without complication. Fortunately there is no signs of active infection at this point. No fevers, chills, nausea, vomiting, or diarrhea. She did get her dressing supplies which is excellent news she was extremely excited to get these. She also got paperwork from [employer] for their financial assistance program where they may be able to help her out in the future if needed  with supplies at discounted prices. 03/06/2019 on evaluation today patient appears to be doing a little worse with regard to both areas of weeping on her bilateral lower extremities. This is around the right medial knee and just above the left ankle. With that being said she is unfortunately not doing as well as I would like to see. I feel like she may need to potentially go see someone at the lymphedema clinic as the wraps  that she needs or even beyond what we can do here at the wound care center. She really does not have wounds she just has open areas of weeping that are causing some difficulty for her. Subsequently because of this and the moisture I am concerned about the potential for infection I am going to likely give her a prophylactic antibiotic today, Keflex, just to be on the safe side. Nonetheless again there is no obvious signs of active infection at this time. 03/13/2019 on evaluation today patient appears to be doing well with regard to her bilateral lower extremities where she has been weeping compared to even last week's evaluation. I see some areas of new skin growth which is excellent and overall I am very pleased with how things seem to be progressing. No fevers, chills, nausea, vomiting, or diarrhea. LATESE, DUFAULT ([id number]) 03/20/2019 on evaluation today patient unfortunately is continuing to have issues with significant edema of the left lower extremity. Her right side seems to be doing much better. Unfortunately her left side is showing increased weeping of the lower portion of her leg. This is quite unfortunate obviously we were hoping to get her into the lymphedema clinic they really do not seem to when I see her how if she is draining. Despite the fact this is really not wound related but more lymphedema weeping related. Nonetheless I do not know that this can be helpful for her to even go for that appointment since again I am not sure there is much that they would actually do at this point. We may need to try a 4 layer compression wrap as best we can on her leg. She is on the Augmentin currently although I am still concerned about whether or not there could be potentially something going on infection wise I would obtain a culture though I understand is not the best being that is a surface culture I just 1 to make sure I do not seem to be missing anything. 03/27/2019 on evaluation today  patient appears to be doing much better in regard to the left lower extremity compared to last week. Last week she had tremendous weeping which I think was subsequent to infection now she seems to be doing much better and very pleased. This is not completely healed but there is a lot of new skin growth and it has dried out quite a bit. Overall I think that we are doing well with how things are moving along at this time. No fevers, chills, nausea, vomiting, or diarrhea. 04/03/2019 evaluation today patient appears to be doing a little worse this week compared to last time I saw her. I think this may be due to the fact that she is having issues with not being able to sleep in her bed at least not until last night. She is therefore been in a lift chair and subsequently has also had issues with not been able to use her pumps since she could not get in bed. With that being said the patient overall seems to be  doing okay I do think I may want extend the antibiotic for a little bit longer at least until we can see if her edema and her weeping gets better and if it is then obviously I can always discontinue the antibiotics as of next week however I want her to continue to have it over the next week. 04/10/2019 on evaluation today patient unfortunately is still doing poorly with regard to her left lower extremity. Her right is all things considering doing fairly well. On the left however she continues to have spreading of the area of infection and weeping which appears to be even a larger surface area than noted last week. She did have a positive culture for Pseudomonas in particular which seems to have been of concern she still has green/yellow discharge consistent with Pseudomonas and subsequently a tremendous amount of it. This has me obviously still concerned about the infection not really clearing up despite the fact that on culture it appears the Cipro should have been a good option for treating this.  I think she may at this point need IV antibiotics since things are not doing better I do not want to get worse and cause sepsis. She is in agreement with the plan and believes as well that she likely does need to go to the hospital for IV vancomycin. Or something of the like depending on what the recommendation is from the ER. 04/17/2019 on evaluation today patient appears to be doing excellent in regard to her lower extremity on the left. She was in the hospital for several days from when I sent her last we saw her until just this past Tuesday. Fortunately her drainage is significantly improved and in fact is mostly clear. There is just a couple small areas that may still drain a little bit she states that the [facility] they prescribed for her at discharge she went picked up from pharmacy and got home but has not been able to find it since. She is looked everywhere. She is wondering if I will replace that for her today I will be more than happy to do that. 05/01/2019 on evaluation today patient actually appears to be doing quite well with regard to her lower extremities. She occasionally is having areas that will leak and then heal up mainly when a piece of the fibrotic skin pops off but fortunately she is not having any signs of active infection at this time. Overall she also really does not have any obvious weeping at this time. I do believe however she really needs some compression wraps and I think this may be a good time to get her back to the lymphedema clinic. 05/11/2019 on evaluation today patient actually appears to be doing quite well with regard to her bilateral lower extremities. She occasionally will have a small area that we per another but in general seems to be completely healed which is great news. Overall very pleased with how everything seems to be progressing. She does have her appointment with lymphedema clinic on November 18. 05/25/2019 on evaluation today patient appears to be  doing well with regard to her left lower extremity. I am very pleased in this regard. In regard to her right leg this actually did start draining more I think it is mainly due to the fact that her leg is more swollen. I am not seeing any obvious signs of infection at this time although that is definitely something were obviously acutely aware of simply due to the fact that  she had an issue not too far back with exactly this issue. Nonetheless I do feel like that lymphedema clinic would still be beneficial for her. I explained obviously if they are not able to do anything treatment wise on the right leg we could at least have them treat her left leg and then proceed from there. The patient is really in agreement with that plan. If they are able to do both as the drainage slows down that I would be happy to let them handle both. 06/01/2019 on evaluation today patient unfortunately appears to be doing worse with regard to her right lower extremity. The left lower extremity is still maintaining at this point. Unfortunately she has been having significantly increased pain over the past several days and has been experiencing as well increased swelling of the right lower extremity. I really do not know that I am seeing anything that appears to be obvious for infection at this point to be peripherally honest. With that being said the patient does seem to be having much more swelling that she is even experienced in the past and coupled with increased pain in her hip as well I am concerned that again she could potentially have a DVT although I am not 100% sure of this. I think it something that may need to be checked out. We discussed the possibility of sending her for a DVT study through the hospital but unfortunately transportation is an issue if she does have a DVT I do not want her to wait days to be able to get in for that test however if she has this scheduled as an outpatient that is as fast that she will  be able to get the test scheduled for transportation purposes. That will also fall on Thanksgiving so subsequently she did actually be looking at either Friday or even next week before we would know anything back from this. That is much too long in my opinion. Subsequent to the amount of discomfort she is experiencing the patient is actually okay with going to the ER for evaluation today. 06/12/2019 on evaluation today patient actually appears to be doing significantly better compared to last time I saw her. Following when I last saw her she was actually in the hospital from that Monday until the following Sunday almost 1 full week. She actually was placed on Keflex in the hospital following the time for her to be discharged and Dr. Steva Ready has recommended 2 times a day dosing of the Keflex for the next year in order to help with more prophylactic/preventative measures with regard to her developing cellulitis. Overall I think this sounds like an excellent plan. The patient unfortunately is good to have trouble being treated at lymphedema clinic due to the fact that she really cannot get up on the bed that they have there. They also state that they cannot manage her as long as she has anything draining at this point. Obviously that is somewhat unfortunate as she does need help with edema control but nonetheless we will have to do what we can for her outside of it sounds like the lymphedema clinic scenario at this point. 06/19/2019 on evaluation today patient appears to be doing fairly well with regard to her bilateral lower extremities. She is not nearly as swollen and shows no signs of infection at this point. There is no evidence of cellulitis whatsoever. She also has no open wounds or draining at this point which is also good news. No fever chills  noted. She seems to be in very good spirits and in fact appears to be doing quite well. READMISSION 11/27/2019 JENNICA, TAGLIAFERRI ([id number]) This is a  64 year old woman that we have had in this clinic several times before including 2015, 16 and 19 and then most recently from 03/20/2019 through 06/19/2019 with bilateral lower extremity lymphedema. She has had previous arterial and reflux studies done years ago which were not all that remarkable. 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 She does have a positive family history and she had large legs starting may be in her 36s. She was recently in hospital from 10/20/2019 through 10/28/2019 with right leg cellulitis. She was given Ancef and clindamycin and then Zosyn when a culture showed Pseudomonas. At that time there was purulent drainage. She was followed by infectious disease Dr Steva Ready. The patient is now back at home. She has noted increased swelling in the right and no drainage in her right leg mostly on the posterior medial aspect in the calf area. She has not had pain or fever. She has literally been improved lysing above dressings because her at the area of this is far too large for standard compression. She has been wrapping the areas with sheets to resorptive pads. She is found these helped somewhat. She does have an appointment with the lymphedema clinic in [place] in late June. Past medical history includes bilateral lymphedema, hypertension, obstructive sleep apnea with CPAP. Recent hospitalization with apparently Pseudomonas cellulitis of the right lower leg 12/15/2019 upon evaluation today patient appears to be doing a little bit worse in regard to her right lower extremity. Unfortunately she is having more weeping down in the lower portion of her leg. Fortunately there is no signs of active infection at this time. No fever chills noted. The patient states she is not having increased pain except for when she attempted to use the lymphedema pumps unfortunately she states that she did have pain when she did this. Otherwise we been  using absorptive dressings of one type or another she is using diapers at home and then subsequently Ace wraps. In regard to the barrier cream we have discussed the possibility of derma cloud which she would like to try I do not have a problem with that. 12/22/2019 upon evaluation today patient actually appears to be doing better in regard to her leg ulcers at this point. Fortunately there does not appear to be any signs of active infection which is great news and I am extremely pleased with where things are progressing at this time. There is no sign of active infection currently. The patient is very pleased to see things doing so well. 12/29/2019 upon evaluation today patient appears to be doing a little bit better in regard to her weeping in general over her lower extremities. She does have some signs of mild erythema little bit more than what I noted last week or rather last visit. Nonetheless I think that my threshold for switching her antibiotics from Keflex to something else is very low at this point considering that she has had such severe infections in the past that seem to come almost out of nowhere. There is a little erythema and warmth noted of the lower portion of her leg compared to the upper which also makes me want to go ahead and address things more rapidly at this point. Likely I would switch out the Keflex for something like Levaquin ideally. 7/16; patient with  severe bilateral lymphedema. She has superficial wounds albeit almost circumferential now on the left lateral lower leg. This may be new from last time. Small area on the right anterior lower leg and then another area on the right medial lower leg and of pannus fold. She has been using various absorptive garments. She states she is using her compression pumps once a day occasionally twice. Culture from her last visit here was negative Objective Constitutional Patient is hypertensive.. Pulse regular and within target range for  patient.Marland Kitchen Respirations regular, non-labored and within target range.. Temperature is normal and within the target range for the patient.Marland Kitchen appears in no distress. Vitals Time Taken: 10:07 AM, Temperature: 98.1 F, Pulse: 63 bpm, Respiratory Rate: 16 breaths/min, Blood Pressure: 157/85 mmHg. Respiratory Respiratory effort is easy and symmetric bilaterally. Rate is normal at rest and on room air.. Cardiovascular Difficult to feel her pulses but her feet are warm and appear to be well-perfused. Massive lower extremity edema.Marland Kitchen Psychiatric No evidence of depression, anxiety, or agitation. Calm, cooperative, and communicative. Appropriate interactions and affect.. General Notes: Wound exam The most problem part here is the left lower leg. She has almost developing circumferential epithelial loss mostly medially but anteriorly and developing posteriorly. Very uncomfortable for the patient. There is no evidence of surrounding cellulitis She has a loss of surface epithelium on the right anterior lower leg but also on area in the medial calf and between 2 hip pannus folds. Integumentary (Hair, Skin) Wound #7 status is Converted. Original cause of wound was Gradually Appeared. The wound is located on the Right,Lateral Lower Leg. The wound measures 8.5cm length x 8.5cm width x 0.1cm depth; 56.745cm^2 area and 5.675cm^3 volume. 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 - and as needed Follow-up Appointments: Return Appointment in 1 week. Edema Control: Elevate legs to the level of the heart and pump ankles as often as possible Compression  Pump: Use compression pump on left lower extremity for 60 minutes, twice daily. Compression Pump: Use compression pump on right lower extremity for 60 minutes, twice daily. Other: - ACE wraps 1. I am concerned about almost circumferential development of epithelial loss in the left lower leg. I told the patient if this happens it will be very difficult to close it. 2. I have told her to get up to twice a day external pressure compression pump usage for 1 hour or perhaps 3 times a day emphasizing the need to control the swelling to get any hope of healing especially on the left. 3. I see no evidence of cellulitis in any area 4. We use Kerramax, ABDs and Ace wraps. The patient uses some combination of maxipads, ABDs and Ace wraps at home. I did emphasize trying to keep her feet elevated. Electronic Signature(s) Signed: 01/22/2020 10:58:53 AM By: Linton Ham MD Previous Signature: 01/22/2020 10:56:57 AM Version By: Linton Ham MD Entered By: Linton Ham on 01/22/2020 10:58:52 Arch, [name] ([id number]) -------------------------------------------------------------------------------- SuperBill Details Patient Name: [name]. Date of Service: 01/22/2020 Medical Record Number: [id number] Patient Account Number: [id number] Date of Birth/Sex: [birth year] (64 y.o. F) Treating RN: Cornell Barman Primary Care Provider: Tandy Gaw Other Clinician: Referring Provider: Tandy Gaw Treating Provider/Extender: Linton Ham  G Weeks in Treatment: 8 Diagnosis Coding ICD-10 Codes Code Description Q82.0 Hereditary lymphedema L97.811 Non-pressure chronic ulcer of other part of right lower leg limited to breakdown of skin L97.821 Non-pressure chronic ulcer of other part of left lower leg limited to breakdown of skin Facility Procedures CPT4 Code: [id number] Description: 38329 - WOUND CARE VISIT-LEV 4 EST PT Modifier: Quantity: 1 Physician Procedures CPT4 Code: 1916606 Description:  00459 - WC PHYS LEVEL 3 - EST PT Modifier: Quantity: 1 CPT4 Code: Description: ICD-10 Diagnosis Description Q82.0 Hereditary lymphedema L97.811 Non-pressure chronic ulcer of other part of right lower leg limited to bre L97.821 Non-pressure chronic ulcer of other part of left lower leg limited to brea Modifier: akdown of skin kdown of skin Quantity: Electronic Signature(s) Signed: 01/27/2020 4:41:04 PM By: Linton Ham MD Entered By: Linton Ham on 01/22/2020 10:59:14

## 2020-01-29 ENCOUNTER — Encounter: Payer: Medicaid Other | Admitting: Physician Assistant

## 2020-01-29 ENCOUNTER — Other Ambulatory Visit: Payer: Self-pay

## 2020-01-29 DIAGNOSIS — E11622 Type 2 diabetes mellitus with other skin ulcer: Secondary | ICD-10-CM | POA: Diagnosis not present

## 2020-01-29 NOTE — Progress Notes (Addendum)
CLIFFORD, COUDRIET ([id number]) Visit Report for 01/29/2020 Chief Complaint Document Details Patient Name: [name], [name]. Date of Service: 01/29/2020 10:30 AM Medical Record Number: [id number] Patient Account Number: [id number] Date of Birth/Sex: [birth year] (64 y.o. F) Treating RN: Grover Canavan Primary Care Provider: Tandy Gaw Other Clinician: Referring Provider: Tandy Gaw Treating Provider/Extender: Melburn Hake, Tyan Dy Weeks in Treatment: 9 Information Obtained from: Patient Chief Complaint Bilateral LE lymphedema Electronic Signature(s) Signed: 01/29/2020 10:59:50 AM By: Worthy Keeler PA-C Entered By: Worthy Keeler on 01/29/2020 10:59:49 [place], [name] ([id number]) -------------------------------------------------------------------------------- HPI Details Patient Name: [name]. Date of Service: 01/29/2020 10:30 AM Medical Record Number: [id number] Patient Account Number: [id number] Date of Birth/Sex: [birth year] (64 y.o. F) Treating RN: Grover Canavan Primary Care Provider: Tandy Gaw Other Clinician: Referring Provider: Tandy Gaw Treating Provider/Extender: STONE III, Kylee Nardozzi Weeks in Treatment: 9 History of Present Illness HPI Description: The patient is a 64 year old female with history of hypertension and a long-standing history of bilateral lower extremity lymphedema (first presented on 4/2) . She has had open ulcers in the past which have always responded to compression therapy. She had briefly been to a lymphedema clinic in the past which helped her at the time. this time around she stopped treatment of her lymphedema pumps approximately 2 weeks ago because of some pain in the knees and then noticed the right leg getting worse. She was seen by her PCP who put her on clindamycin 4 times a day 2 days ago. The patient has seen [facility] and Dr. Delana Meyer had seen her last year where a vascular study including venous and arterial duplex studies were  within normal limits. he had recommended compression stockings and lymphedema pumps and the patient has been using this in about 2 weeks ago. She is known to be diabetic but in the past few time she's gone to her primary care doctor her hemoglobin A1c has been normal. 02/11/2015 - after her last visit she took my advice and went to the ER regarding the progressive cellulitis of her right lower extremity and she was admitted between July 17 and 22nd. She received IV antibiotics and then was sent home on a course of steroid-induced and oral antibiotics. She has improved much since then. 02/17/2015 -- she has been doing fine and the weeping of her legs has remarkably gone down. She has no fresh issues. READMISSION 01/15/18 This patient was given this clinic before most recently in 2016 seen by Dr. Con Memos. She has massive bilateral lymphedema and over the last 2 months this had weeping edema out of the left leg. She has compression pumps but her compliance with these has been minimal. She has [facility] they've been using TCA/ABDs/kerlix under an Ace wrap.she has had recent problems with cellulitis. She was apparently seen in the ER and 12/23/17 and given clindamycin. She was then followed by her primary doctor and given doxycycline and Keflex. The pain seems to have settled down. In April 2018 the patient had arterial studies done at [facility]. This showed triphasic waveforms throughout the right leg and mostly triphasic waveforms on the left except for monophasic at the posterior tibial artery distally. She was not felt to have evidence of right lower extremity arterial stenosis or significant problems on the left side. 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 She also had a right lower extremity venous Doppler in January 2018 this was limited by the patient's body habitus and lymphedema. Most of the  proximal veins were not visualized The patient  presents with an area of denuded skin on the anterior medial part of the left calf. There is weeping edema fluid here. 01/22/18; the patient has somewhat better edema control using her compression pumps twice a day and as a result she has much better epithelialization on the left anterior calf area. Only a small open area remains. 01/29/18; the patient has been compliant with her compression pumps. Both the areas on her calf that healed. The remaining area on the left anterior leg is fully epithelialized Readmission: 02/20/2019 upon evaluation today patient presents for reevaluation due to issues that she is having with the bilateral lower extremities. She actually has wounds open on both legs. On the right she has an area in the crease of her leg on the right around the knee region which is actually draining quite a bit and actually has some fungal type appearance to it. She has been on nystatin powder that seems to have helped to some degree. In regard to the left lower extremity this is actually in the lower portion of her leg closer to the ankle and again is continuing to drain as well unfortunately. There does not appear to be any signs of active infection at this time which is good news. No fevers, chills, nausea, vomiting, or diarrhea. She tells me that since she was seen last year she is actually been doing quite well for the most part with regard to her lower extremities. Unfortunately she now is experiencing a little bit more drainage at this time. She is concerned about getting this under control so that it does not get significantly worse. 02/27/2019 on evaluation today patient appears to be doing somewhat better in regard to her bilateral lower extremity wounds. She has been tolerating the dressing changes without complication. Fortunately there is no signs of active infection at this point. No fevers, chills, nausea, vomiting, or diarrhea. She did get her dressing supplies which is  excellent news she was extremely excited to get these. She also got paperwork from [employer] for their financial assistance program where they may be able to help her out in the future if needed with supplies at discounted prices. 03/06/2019 on evaluation today patient appears to be doing a little worse with regard to both areas of weeping on her bilateral lower extremities. This is around the right medial knee and just above the left ankle. With that being said she is unfortunately not doing as well as I would like to see. I feel like she may need to potentially go see someone at the lymphedema clinic as the wraps that she needs or even beyond what we can do here at the wound care center. She really does not have wounds she just has open areas of weeping that are causing some difficulty for her. Subsequently because of this and the moisture I am concerned about the potential for infection I am going to likely give her a prophylactic antibiotic today, Keflex, just to be on the safe side. Nonetheless again there is no obvious signs of active infection at this time. 03/13/2019 on evaluation today patient appears to be doing well with regard to her bilateral lower extremities where she has been weeping compared to even last week's evaluation. I see some areas of new skin growth which is excellent and overall I am very pleased with how things seem to be progressing. No fevers, chills, nausea, vomiting, or diarrhea. MEKIAH, WAHLER ([id number]) 03/20/2019 on evaluation  today patient unfortunately is continuing to have issues with significant edema of the left lower extremity. Her right side seems to be doing much better. Unfortunately her left side is showing increased weeping of the lower portion of her leg. This is quite unfortunate obviously we were hoping to get her into the lymphedema clinic they really do not seem to when I see her how if she is draining. Despite the fact this is really not wound related  but more lymphedema weeping related. Nonetheless I do not know that this can be helpful for her to even go for that appointment since again I am not sure there is much that they would actually do at this point. We may need to try a 4 layer compression wrap as best we can on her leg. She is on the Augmentin currently although I am still concerned about whether or not there could be potentially something going on infection wise I would obtain a culture though I understand is not the best being that is a surface culture I just 1 to make sure I do not seem to be missing anything. 03/27/2019 on evaluation today patient appears to be doing much better in regard to the left lower extremity compared to last week. Last week she had tremendous weeping which I think was subsequent to infection now she seems to be doing much better and very pleased. This is not completely healed but there is a lot of new skin growth and it has dried out quite a bit. Overall I think that we are doing well with how things are moving along at this time. No fevers, chills, nausea, vomiting, or diarrhea. 04/03/2019 evaluation today patient appears to be doing a little worse this week compared to last time I saw her. I think this may be due to the fact that she is having issues with not being able to sleep in her bed at least not until last night. She is therefore been in a lift chair and subsequently has also had issues with not been able to use her pumps since she could not get in bed. With that being said the patient overall seems to be doing okay I do think I may want extend the antibiotic for a little bit longer at least until we can see if her edema and her weeping gets better and if it is then obviously I can always discontinue the antibiotics as of next week however I want her to continue to have it over the next week. 04/10/2019 on evaluation today patient unfortunately is still doing poorly with regard to her left lower  extremity. Her right is all things considering doing fairly well. On the left however she continues to have spreading of the area of infection and weeping which appears to be even a larger surface area than noted last week. She did have a positive culture for Pseudomonas in particular which seems to have been of concern she still has green/yellow discharge consistent with Pseudomonas and subsequently a tremendous amount of it. This has me obviously still concerned about the infection not really clearing up despite the fact that on culture it appears the Cipro should have been a good option for treating this. I think she may at this point need IV antibiotics since things are not doing better I do not want to get worse and cause sepsis. She is in agreement with the plan and believes as well that she likely does need to go to the hospital for  IV vancomycin. Or something of the like depending on what the recommendation is from the ER. 04/17/2019 on evaluation today patient appears to be doing excellent in regard to her lower extremity on the left. She was in the hospital for several days from when I sent her last we saw her until just this past Tuesday. Fortunately her drainage is significantly improved and in fact is mostly clear. There is just a couple small areas that may still drain a little bit she states that the [facility] they prescribed for her at discharge she went picked up from pharmacy and got home but has not been able to find it since. She is looked everywhere. She is wondering if I will replace that for her today I will be more than happy to do that. 05/01/2019 on evaluation today patient actually appears to be doing quite well with regard to her lower extremities. She occasionally is having areas that will leak and then heal up mainly when a piece of the fibrotic skin pops off but fortunately she is not having any signs of active infection at this time. Overall she also really does not have  any obvious weeping at this time. I do believe however she really needs some compression wraps and I think this may be a good time to get her back to the lymphedema clinic. 05/11/2019 on evaluation today patient actually appears to be doing quite well with regard to her bilateral lower extremities. She occasionally will have a small area that we per another but in general seems to be completely healed which is great news. Overall very pleased with how everything seems to be progressing. She does have her appointment with lymphedema clinic on November 18. 05/25/2019 on evaluation today patient appears to be doing well with regard to her left lower extremity. I am very pleased in this regard. In regard to her right leg this actually did start draining more I think it is mainly due to the fact that her leg is more swollen. I am not seeing any obvious signs of infection at this time although that is definitely something were obviously acutely aware of simply due to the fact that she had an issue not too far back with exactly this issue. Nonetheless I do feel like that lymphedema clinic would still be beneficial for her. I explained obviously if they are not able to do anything treatment wise on the right leg we could at least have them treat her left leg and then proceed from there. The patient is really in agreement with that plan. If they are able to do both as the drainage slows down that I would be happy to let them handle both. 06/01/2019 on evaluation today patient unfortunately appears to be doing worse with regard to her right lower extremity. The left lower extremity is still maintaining at this point. Unfortunately she has been having significantly increased pain over the past several days and has been experiencing as well increased swelling of the right lower extremity. I really do not know that I am seeing anything that appears to be obvious for infection at this point to be peripherally  honest. With that being said the patient does seem to be having much more swelling that she is even experienced in the past and coupled with increased pain in her hip as well I am concerned that again she could potentially have a DVT although I am not 100% sure of this. I think it something that may need to  be checked out. We discussed the possibility of sending her for a DVT study through the hospital but unfortunately transportation is an issue if she does have a DVT I do not want her to wait days to be able to get in for that test however if she has this scheduled as an outpatient that is as fast that she will be able to get the test scheduled for transportation purposes. That will also fall on Thanksgiving so subsequently she did actually be looking at either Friday or even next week before we would know anything back from this. That is much too long in my opinion. Subsequent to the amount of discomfort she is experiencing the patient is actually okay with going to the ER for evaluation today. 06/12/2019 on evaluation today patient actually appears to be doing significantly better compared to last time I saw her. Following when I last saw her she was actually in the hospital from that Monday until the following Sunday almost 1 full week. She actually was placed on Keflex in the hospital following the time for her to be discharged and Dr. Steva Ready has recommended 2 times a day dosing of the Keflex for the next year in order to help with more prophylactic/preventative measures with regard to her developing cellulitis. Overall I think this sounds like an excellent plan. The patient unfortunately is good to have trouble being treated at lymphedema clinic due to the fact that she really cannot get up on the bed that they have there. They also state that they cannot manage her as long as she has anything draining at this point. Obviously that is somewhat unfortunate as she does need help with edema  control but nonetheless we will have to do what we can for her outside of it sounds like the lymphedema clinic scenario at this point. 06/19/2019 on evaluation today patient appears to be doing fairly well with regard to her bilateral lower extremities. She is not nearly as swollen and shows no signs of infection at this point. There is no evidence of cellulitis whatsoever. She also has no open wounds or draining at this point which is also good news. No fever chills noted. She seems to be in very good spirits and in fact appears to be doing quite well. READMISSION 11/27/2019 SHACOYA, BURKHAMMER ([id number]) This is a 64 year old woman that we have had in this clinic several times before including 2015, 16 and 19 and then most recently from 03/20/2019 through 06/19/2019 with bilateral lower extremity lymphedema. She has had previous arterial and reflux studies done years ago which were not all that remarkable. 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 She does have a positive family history and she had large legs starting may be in her 64s. She was recently in hospital from 10/20/2019 through 10/28/2019 with right leg cellulitis. She was given Ancef and clindamycin and then Zosyn when a culture showed Pseudomonas. At that time there was purulent drainage. She was followed by infectious disease Dr Steva Ready. The patient is now back at home. She has noted increased swelling in the right and no drainage in her right leg mostly on the posterior medial aspect in the calf area. She has not had pain or fever. She has literally been improved lysing above dressings because her at the area of this is far too large for standard compression. She has been wrapping the areas with sheets to resorptive pads. She is found these helped  somewhat. She does have an appointment with the lymphedema clinic in [place] in late June. Past medical history includes bilateral  lymphedema, hypertension, obstructive sleep apnea with CPAP. Recent hospitalization with apparently Pseudomonas cellulitis of the right lower leg 12/15/2019 upon evaluation today patient appears to be doing a little bit worse in regard to her right lower extremity. Unfortunately she is having more weeping down in the lower portion of her leg. Fortunately there is no signs of active infection at this time. No fever chills noted. The patient states she is not having increased pain except for when she attempted to use the lymphedema pumps unfortunately she states that she did have pain when she did this. Otherwise we been using absorptive dressings of one type or another she is using diapers at home and then subsequently Ace wraps. In regard to the barrier cream we have discussed the possibility of derma cloud which she would like to try I do not have a problem with that. 12/22/2019 upon evaluation today patient actually appears to be doing better in regard to her leg ulcers at this point. Fortunately there does not appear to be any signs of active infection which is great news and I am extremely pleased with where things are progressing at this time. There is no sign of active infection currently. The patient is very pleased to see things doing so well. 12/29/2019 upon evaluation today patient appears to be doing a little bit better in regard to her weeping in general over her lower extremities. She does have some signs of mild erythema little bit more than what I noted last week or rather last visit. Nonetheless I think that my threshold for switching her antibiotics from Keflex to something else is very low at this point considering that she has had such severe infections in the past that seem to come almost out of nowhere. There is a little erythema and warmth noted of the lower portion of her leg compared to the upper which also makes me want to go ahead and address things more rapidly at this point.  Likely I would switch out the Keflex for something like Levaquin ideally. 7/16; patient with severe bilateral lymphedema. She has superficial wounds albeit almost circumferential now on the left lateral lower leg. This may be new from last time. Small area on the right anterior lower leg and then another area on the right medial lower leg and of pannus fold. She has been using various absorptive garments. She states she is using her compression pumps once a day occasionally twice. Culture from her last visit here was negative 01/29/2020 on evaluation today patient appears to be doing excellent at this point in regard to her legs with regard to infection I see no signs of active infection at this point. She still does have unfortunately areas of weeping this is minimal on the right now her left is actually significantly worse although I do not think it is as bad as last week with Dr. Dellia Nims saw her. She has been trying to pump and elevate her legs is much as possible. She has previously been on the Keflex and in the past for prevention that seems to do fairly well and likely can extend that today. Electronic Signature(s) Signed: 01/29/2020 11:09:33 AM By: Worthy Keeler PA-C Entered By: Worthy Keeler on 01/29/2020 [place], [name] ([id number]) -------------------------------------------------------------------------------- Physical Exam Details Patient Name: TAZIAH, DIFATTA C. Date of Service: 01/29/2020 10:30 AM Medical Record Number: [id number] Patient  Account Number: [id number] Date of Birth/Sex: Aug 20, 1955 (64 y.o. F) Treating RN: Grover Canavan Primary Care Provider: Tandy Gaw Other Clinician: Referring Provider: Tandy Gaw Treating Provider/Extender: STONE III, Stpehanie Montroy Weeks in Treatment: 9 Constitutional Obese and well-hydrated in no acute distress. Respiratory normal breathing without difficulty. Psychiatric this patient is able to make decisions and demonstrates good  insight into disease process. Alert and Oriented x 3. pleasant and cooperative. Notes Upon inspection patient's wound bed actually showed signs of skin breakdown minimally at this point which is good news there does not appear to be any signs of active infection which is also good news no warmth to touch. With that being said she does have areas of weeping left leg greater than right leg currently that are still open and I think this puts her at risk for infection, recommend putting her back on the Keflex for prevention at this time she is done with the Levaquin which was actually complete about a month ago Electronic Signature(s) Signed: 01/29/2020 11:10:11 AM By: Worthy Keeler PA-C Entered By: Worthy Keeler on 01/29/2020 11:10:10 [place], [name] ([id number]) -------------------------------------------------------------------------------- Physician Orders Details Patient Name: [name]. Date of Service: 01/29/2020 10:30 AM Medical Record Number: [id number] Patient Account Number: [id number] Date of Birth/Sex: Nov 23, 1955 (64 y.o. 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 - and as needed Follow-up Appointments o Return Appointment in 1 week. Edema Control o Elevate legs to the level of the heart and pump ankles as often as possible o Compression Pump: Use compression pump on left lower  extremity for 60 minutes, twice daily. o Compression Pump: Use compression pump on right lower extremity for 60 minutes, twice daily. o Other: - ACE wraps Patient Medications Allergies: ibuprofen, ACE Inhibitors Notifications Medication Indication Start End Keflex 01/29/2020 DOSE 1 - oral 500 mg capsule - 1 capsule oral taken 4 times per day for 30 days Electronic Signature(s) Signed: 01/29/2020 11:11:32 AM By: Worthy Keeler PA-C Entered By: Worthy Keeler on 01/29/2020 11:11:31 [place], [name] ([id number]) -------------------------------------------------------------------------------- Problem List Details Patient Name: [name]. Date of Service: 01/29/2020 10:30 AM Medical Record Number: [id number] Patient Account Number: [id number] Date of Birth/Sex: [birth year] (64 y.o. 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 Date of Service: 01/29/2020 10:30 AM Medical Record Number: [id number] Patient Account Number: [id number] Date of Birth/Sex: [birth year] (64 y.o. F) Treating RN:  Grover Canavan Primary Care Provider: Tandy Gaw Other Clinician: Referring  Provider: Tandy Gaw Treating Provider/Extender: Melburn Hake, Fara Worthy Weeks in Treatment: 9 Subjective Chief Complaint Information obtained from Patient Bilateral LE lymphedema History of Present Illness (HPI) The patient is a 64 year old female with history of hypertension and a long-standing history of bilateral lower extremity lymphedema (first presented on 4/2) . She has had open ulcers in the past which have always responded to compression therapy. She had briefly been to a lymphedema clinic in the past which helped her at the time. this time around she stopped treatment of her lymphedema pumps approximately 2 weeks ago because of some pain in the knees and then noticed the right leg getting worse. She was seen by her PCP who put her on clindamycin 4 times a day 2 days ago. The patient has seen [facility] and Dr. Delana Meyer had seen her last year where a vascular study including venous and arterial duplex studies were within normal limits. he had recommended compression stockings and lymphedema pumps and the patient has been using this in about 2 weeks ago. She is known to be diabetic but in the past few time she's gone to her primary care doctor her hemoglobin A1c has been normal. 02/11/2015 - after her last visit she took my advice and went to the ER regarding the progressive cellulitis of her right lower extremity and she was admitted between July 17 and 22nd. She received IV antibiotics and then was sent home on a course of steroid-induced and oral antibiotics. She has improved much since then. 02/17/2015 -- she has been doing fine and the weeping of her legs has remarkably gone down. She has no fresh issues. READMISSION 01/15/18 This patient was given this clinic before most recently in 2016 seen by Dr. Con Memos. She has massive bilateral lymphedema and over the last 2 months this had weeping edema out of the left leg. She has compression pumps but her compliance with these has been minimal. She  has [facility] they've been using TCA/ABDs/kerlix under an Ace wrap.she has had recent problems with cellulitis. She was apparently seen in the ER and 12/23/17 and given clindamycin. She was then followed by her primary doctor and given doxycycline and Keflex. The pain seems to have settled down. In April 2018 the patient had arterial studies done at [facility]. This showed triphasic waveforms throughout the right leg and mostly triphasic waveforms on the left except for monophasic at the posterior tibial artery distally. She was not felt to have evidence of right lower extremity arterial stenosis or significant problems on the left side. 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 She also had a right lower extremity venous Doppler in January 2018 this was limited by the patient's body habitus and lymphedema. Most of the proximal veins were not visualized The patient presents with an area of denuded skin on the anterior medial part of the left calf. There is weeping edema fluid here. 01/22/18; the patient has somewhat better edema control using her compression pumps twice a day and as a result she has much better epithelialization on the left anterior calf area. Only a small open area remains. 01/29/18; the patient has been compliant with her compression pumps. Both the areas on her calf that healed. The remaining area on the left anterior leg is fully epithelialized Readmission: 02/20/2019 upon evaluation today patient presents for reevaluation due to issues that she is having with the bilateral lower extremities.  She actually has wounds open on both legs. On the right she has an area in the crease of her leg on the right around the knee region which is actually draining quite a bit and actually has some fungal type appearance to it. She has been on nystatin powder that seems to have helped to some degree. In regard to the left lower extremity this is  actually in the lower portion of her leg closer to the ankle and again is continuing to drain as well unfortunately. There does not appear to be any signs of active infection at this time which is good news. No fevers, chills, nausea, vomiting, or diarrhea. She tells me that since she was seen last year she is actually been doing quite well for the most part with regard to her lower extremities. Unfortunately she now is experiencing a little bit more drainage at this time. She is concerned about getting this under control so that it does not get significantly worse. 02/27/2019 on evaluation today patient appears to be doing somewhat better in regard to her bilateral lower extremity wounds. She has been tolerating the dressing changes without complication. Fortunately there is no signs of active infection at this point. No fevers, chills, nausea, vomiting, or diarrhea. She did get her dressing supplies which is excellent news she was extremely excited to get these. She also got paperwork from [employer] for their financial assistance program where they may be able to help her out in the future if needed with supplies at discounted prices. 03/06/2019 on evaluation today patient appears to be doing a little worse with regard to both areas of weeping on her bilateral lower extremities. This is around the right medial knee and just above the left ankle. With that being said she is unfortunately not doing as well as I would like to see. I feel like she may need to potentially go see someone at the lymphedema clinic as the wraps that she needs or even beyond what we can do here at the wound care center. She really does not have wounds she just has open areas of weeping that are causing some difficulty for her. Subsequently because of this and the moisture I am concerned about the potential for infection I am going to likely give her a prophylactic antibiotic today, Keflex, just to be on the safe side. Nonetheless  again there is no obvious signs of active infection at this time. BERNADINE, MELECIO ([id number]) 03/13/2019 on evaluation today patient appears to be doing well with regard to her bilateral lower extremities where she has been weeping compared to even last week's evaluation. I see some areas of new skin growth which is excellent and overall I am very pleased with how things seem to be progressing. No fevers, chills, nausea, vomiting, or diarrhea. 03/20/2019 on evaluation today patient unfortunately is continuing to have issues with significant edema of the left lower extremity. Her right side seems to be doing much better. Unfortunately her left side is showing increased weeping of the lower portion of her leg. This is quite unfortunate obviously we were hoping to get her into the lymphedema clinic they really do not seem to when I see her how if she is draining. Despite the fact this is really not wound related but more lymphedema weeping related. Nonetheless I do not know that this can be helpful for her to even go for that appointment since again I am not sure there is much that they would actually  do at this point. We may need to try a 4 layer compression wrap as best we can on her leg. She is on the Augmentin currently although I am still concerned about whether or not there could be potentially something going on infection wise I would obtain a culture though I understand is not the best being that is a surface culture I just 1 to make sure I do not seem to be missing anything. 03/27/2019 on evaluation today patient appears to be doing much better in regard to the left lower extremity compared to last week. Last week she had tremendous weeping which I think was subsequent to infection now she seems to be doing much better and very pleased. This is not completely healed but there is a lot of new skin growth and it has dried out quite a bit. Overall I think that we are doing well with how things  are moving along at this time. No fevers, chills, nausea, vomiting, or diarrhea. 04/03/2019 evaluation today patient appears to be doing a little worse this week compared to last time I saw her. I think this may be due to the fact that she is having issues with not being able to sleep in her bed at least not until last night. She is therefore been in a lift chair and subsequently has also had issues with not been able to use her pumps since she could not get in bed. With that being said the patient overall seems to be doing okay I do think I may want extend the antibiotic for a little bit longer at least until we can see if her edema and her weeping gets better and if it is then obviously I can always discontinue the antibiotics as of next week however I want her to continue to have it over the next week. 04/10/2019 on evaluation today patient unfortunately is still doing poorly with regard to her left lower extremity. Her right is all things considering doing fairly well. On the left however she continues to have spreading of the area of infection and weeping which appears to be even a larger surface area than noted last week. She did have a positive culture for Pseudomonas in particular which seems to have been of concern she still has green/yellow discharge consistent with Pseudomonas and subsequently a tremendous amount of it. This has me obviously still concerned about the infection not really clearing up despite the fact that on culture it appears the Cipro should have been a good option for treating this. I think she may at this point need IV antibiotics since things are not doing better I do not want to get worse and cause sepsis. She is in agreement with the plan and believes as well that she likely does need to go to the hospital for IV vancomycin. Or something of the like depending on what the recommendation is from the ER. 04/17/2019 on evaluation today patient appears to be doing excellent  in regard to her lower extremity on the left. She was in the hospital for several days from when I sent her last we saw her until just this past Tuesday. Fortunately her drainage is significantly improved and in fact is mostly clear. There is just a couple small areas that may still drain a little bit she states that the [facility] they prescribed for her at discharge she went picked up from pharmacy and got home but has not been able to find it since. She is looked  everywhere. She is wondering if I will replace that for her today I will be more than happy to do that. 05/01/2019 on evaluation today patient actually appears to be doing quite well with regard to her lower extremities. She occasionally is having areas that will leak and then heal up mainly when a piece of the fibrotic skin pops off but fortunately she is not having any signs of active infection at this time. Overall she also really does not have any obvious weeping at this time. I do believe however she really needs some compression wraps and I think this may be a good time to get her back to the lymphedema clinic. 05/11/2019 on evaluation today patient actually appears to be doing quite well with regard to her bilateral lower extremities. She occasionally will have a small area that we per another but in general seems to be completely healed which is great news. Overall very pleased with how everything seems to be progressing. She does have her appointment with lymphedema clinic on November 18. 05/25/2019 on evaluation today patient appears to be doing well with regard to her left lower extremity. I am very pleased in this regard. In regard to her right leg this actually did start draining more I think it is mainly due to the fact that her leg is more swollen. I am not seeing any obvious signs of infection at this time although that is definitely something were obviously acutely aware of simply due to the fact that she had an issue not too  far back with exactly this issue. Nonetheless I do feel like that lymphedema clinic would still be beneficial for her. I explained obviously if they are not able to do anything treatment wise on the right leg we could at least have them treat her left leg and then proceed from there. The patient is really in agreement with that plan. If they are able to do both as the drainage slows down that I would be happy to let them handle both. 06/01/2019 on evaluation today patient unfortunately appears to be doing worse with regard to her right lower extremity. The left lower extremity is still maintaining at this point. Unfortunately she has been having significantly increased pain over the past several days and has been experiencing as well increased swelling of the right lower extremity. I really do not know that I am seeing anything that appears to be obvious for infection at this point to be peripherally honest. With that being said the patient does seem to be having much more swelling that she is even experienced in the past and coupled with increased pain in her hip as well I am concerned that again she could potentially have a DVT although I am not 100% sure of this. I think it something that may need to be checked out. We discussed the possibility of sending her for a DVT study through the hospital but unfortunately transportation is an issue if she does have a DVT I do not want her to wait days to be able to get in for that test however if she has this scheduled as an outpatient that is as fast that she will be able to get the test scheduled for transportation purposes. That will also fall on Thanksgiving so subsequently she did actually be looking at either Friday or even next week before we would know anything back from this. That is much too long in my opinion. Subsequent to the amount of discomfort she is  experiencing the patient is actually okay with going to the ER for evaluation  today. 06/12/2019 on evaluation today patient actually appears to be doing significantly better compared to last time I saw her. Following when I last saw her she was actually in the hospital from that Monday until the following Sunday almost 1 full week. She actually was placed on Keflex in the hospital following the time for her to be discharged and Dr. Steva Ready has recommended 2 times a day dosing of the Keflex for the next year in order to help with more prophylactic/preventative measures with regard to her developing cellulitis. Overall I think this sounds like an excellent plan. The patient unfortunately is good to have trouble being treated at lymphedema clinic due to the fact that she really cannot get up on the bed that they have there. They also state that they cannot manage her as long as she has anything draining at this point. Obviously that is somewhat unfortunate as she does need help with edema control but nonetheless we will have to do what we can for her outside of it sounds like the lymphedema clinic scenario at this point. 06/19/2019 on evaluation today patient appears to be doing fairly well with regard to her bilateral lower extremities. She is not nearly as swollen Manuele, Randy C. ([id number]) and shows no signs of infection at this point. There is no evidence of cellulitis whatsoever. She also has no open wounds or draining at this point which is also good news. No fever chills noted. She seems to be in very good spirits and in fact appears to be doing quite well. READMISSION 11/27/2019 This is a 64 year old woman that we have had in this clinic several times before including 2015, 16 and 19 and then most recently from 03/20/2019 through 06/19/2019 with bilateral lower extremity lymphedema. She has had previous arterial and reflux studies done years ago which were not all that remarkable. 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 She does have a positive family history and she had large legs starting may be in her 68s. She was recently in hospital from 10/20/2019 through 10/28/2019 with right leg cellulitis. She was given Ancef and clindamycin and then Zosyn when a culture showed Pseudomonas. At that time there was purulent drainage. She was followed by infectious disease Dr Steva Ready. The patient is now back at home. She has noted increased swelling in the right and no drainage in her right leg mostly on the posterior medial aspect in the calf area. She has not had pain or fever. She has literally been improved lysing above dressings because her at the area of this is far too large for standard compression. She has been wrapping the areas with sheets to resorptive pads. She is found these helped somewhat. She does have an appointment with the lymphedema clinic in [place] in late June. Past medical history includes bilateral lymphedema, hypertension, obstructive sleep apnea with CPAP. Recent hospitalization with apparently Pseudomonas cellulitis of the right lower leg 12/15/2019 upon evaluation today patient appears to be doing a little bit worse in regard to her right lower extremity. Unfortunately she is having more weeping down in the lower portion of her leg. Fortunately there is no signs of active infection at this time. No fever chills noted. The patient states she is not having increased pain except for when she attempted to use the lymphedema pumps unfortunately she states that she did have pain  when she did this. Otherwise we been using absorptive dressings of one type or another she is using diapers at home and then subsequently Ace wraps. In regard to the barrier cream we have discussed the possibility of derma cloud which she would like to try I do not have a problem with that. 12/22/2019 upon evaluation today patient actually appears to be doing better in regard to her leg ulcers at this point.  Fortunately there does not appear to be any signs of active infection which is great news and I am extremely pleased with where things are progressing at this time. There is no sign of active infection currently. The patient is very pleased to see things doing so well. 12/29/2019 upon evaluation today patient appears to be doing a little bit better in regard to her weeping in general over her lower extremities. She does have some signs of mild erythema little bit more than what I noted last week or rather last visit. Nonetheless I think that my threshold for switching her antibiotics from Keflex to something else is very low at this point considering that she has had such severe infections in the past that seem to come almost out of nowhere. There is a little erythema and warmth noted of the lower portion of her leg compared to the upper which also makes me want to go ahead and address things more rapidly at this point. Likely I would switch out the Keflex for something like Levaquin ideally. 7/16; patient with severe bilateral lymphedema. She has superficial wounds albeit almost circumferential now on the left lateral lower leg. This may be new from last time. Small area on the right anterior lower leg and then another area on the right medial lower leg and of pannus fold. She has been using various absorptive garments. She states she is using her compression pumps once a day occasionally twice. Culture from her last visit here was negative 01/29/2020 on evaluation today patient appears to be doing excellent at this point in regard to her legs with regard to infection I see no signs of active infection at this point. She still does have unfortunately areas of weeping this is minimal on the right now her left is actually significantly worse although I do not think it is as bad as last week with Dr. Dellia Nims saw her. She has been trying to pump and elevate her legs is much as possible. She has previously  been on the Keflex and in the past for prevention that seems to do fairly well and likely can extend that today. Objective Constitutional Obese and well-hydrated in no acute distress. Vitals Time Taken: 10:49 AM, Temperature: 98.2 F, Pulse: 78 bpm, Respiratory Rate: 18 breaths/min, Blood Pressure: 126/70 mmHg. Respiratory normal breathing without difficulty. Psychiatric this patient is able to make decisions and demonstrates good insight into disease process. Alert and Oriented x 3. pleasant and cooperative. General Notes: Upon inspection patient's wound bed actually showed signs of skin breakdown minimally at this point which is good news there does not appear to be any signs of active infection which is also good news no warmth to touch. With that being said she does have areas of weeping left leg greater than right leg currently that are still open and I think this puts her at risk for infection, recommend putting her back on the Keflex for prevention at this time she is done with the Levaquin which was actually complete about a month ago Other Condition(s) Patient presents  with Lymphedema located on the Right Leg. LATRISA, HELLUMS ([id number]) General Notes: circumferential 10 Patient presents with Lymphedema located on the Left Leg. 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 - and as needed Follow-up Appointments: Return Appointment in 1 week. Edema Control: Elevate legs to the level of the heart and pump ankles as often as possible Compression Pump: Use compression pump on left lower extremity for 60 minutes,  twice daily. Compression Pump: Use compression pump on right lower extremity for 60 minutes, twice daily. Other: - ACE wraps The following medication(s) was prescribed: Keflex oral 500 mg capsule 1 1 capsule oral taken 4 times per day for 30 days starting 01/29/2020 1. I would recommend currently that we go ahead and initiate treatment with a continuation of the XtraSorb here in the clinic which does seem to have done well for the patient. Unfortunately she just cannot afford this at home. 2. I am also can recommend that we continue to wrap with roll gauze followed by an Ace wrap to secure in place. 3. I am also can recommend she continue to elevate her legs and pump as often as possible with the lymphedema pumps twice a day is ideal. 4. She should continue to sleep in her hospital bed keep her legs elevated as much as possible. 5. I am going to suggest that the patient also needs to go ahead and get back on the Keflex which I will put just for prevention over the next month to try to keep her from becoming infected. We will see patient back for reevaluation in 1 week here in the clinic. If anything worsens or changes patient will contact our office for additional recommendations. Electronic Signature(s) Signed: 01/29/2020 11:13:21 AM By: Worthy Keeler PA-C Entered By: Worthy Keeler on 01/29/2020 11:13:20 Gatti, [name] ([id number]) -------------------------------------------------------------------------------- SuperBill Details Patient Name: [name] Date of Service: 01/29/2020 Medical Record Number: [id number] Patient Account Number: [id number] Date of Birth/Sex: [birth year] (64 y.o. 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 Facility Procedures CPT4 Code: 02725366 Description: 99213 - WOUND CARE VISIT-LEV 3 EST PT Modifier: Quantity: 1 Physician Procedures CPT4 Code: 4403474 Description: 25956 - WC PHYS LEVEL 4 - EST PT Modifier: Quantity: 1 CPT4 Code: Description: ICD-10 Diagnosis Description Q82.0 Hereditary lymphedema 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

## 2020-02-02 NOTE — Progress Notes (Signed)
KATARYNA, MCQUILKIN ([id number]) Visit Report for 01/29/2020 Arrival Information Details Patient Name: ZARAYAH, LANTING. Date of Service: 01/29/2020 10:30 AM Medical Record Number: [id number] Patient Account Number: [id number] Date of Birth/Sex: 08/28/1955 (64 y.o. F) Treating RN: Grover Canavan Primary Care Skilynn Durney: Tandy Gaw Other Clinician: Referring Shadara Lopez: Tandy Gaw Treating Ransom Nickson/Extender: Melburn Hake, HOYT Weeks in Treatment: 9 Visit Information History Since Last Visit Added or deleted any medications: No Patient Arrived: Ambulatory Had a fall or experienced change in No Arrival Time: 10:47 activities of daily living that may affect Accompanied By: alone risk of falls: Transfer Assistance: None Implantable device outside of the clinic excluding No Patient Identification Verified: Yes cellular tissue based products placed in the center Secondary Verification Process Completed: Yes since last visit: Pain Present Now: Yes Electronic Signature(s) Signed: 02/02/2020 9:53:36 AM By: Grover Canavan Entered By: Grover Canavan on 01/29/2020 10:49:05 Laiche, [name] ([id number]) -------------------------------------------------------------------------------- Clinic Level of Care Assessment Details Patient Name: [name]. Date of Service: 01/29/2020 10:30 AM Medical Record Number: [id number] Patient Account Number: [id number] Date of Birth/Sex: [birth year] (64 y.o. F) Treating RN: Grover Canavan Primary Care Halston Fairclough: Tandy Gaw Other Clinician: Referring Wendelin Bradt: Tandy Gaw Treating Cloa Bushong/Extender: Melburn Hake, HOYT Weeks in Treatment: 9 Clinic Level of Care Assessment Items TOOL 4 Quantity Score []  - Use when only an EandM is performed on FOLLOW-UP visit 0 ASSESSMENTS - Nursing Assessment / Reassessment X - Reassessment of Co-morbidities (includes updates in patient status) 1 10 X- 1 5 Reassessment of Adherence to Treatment Plan ASSESSMENTS -  Wound and Skin Assessment / Reassessment []  - Simple Wound Assessment / Reassessment - one wound 0 X- 2 5 Complex Wound Assessment / Reassessment - multiple wounds []  - 0 Dermatologic / Skin Assessment (not related to wound area) ASSESSMENTS - Focused Assessment X - Circumferential Edema Measurements - multi extremities 1 5 []  - 0 Nutritional Assessment / Counseling / Intervention []  - 0 Lower Extremity Assessment (monofilament, tuning fork, pulses) []  - 0 Peripheral Arterial Disease Assessment (using hand held doppler) ASSESSMENTS - Ostomy and/or Continence Assessment and Care []  - Incontinence Assessment and Management 0 []  - 0 Ostomy Care Assessment and Management (repouching, etc.) PROCESS - Coordination of Care X - Simple Patient / Family Education for ongoing care 1 15 []  - 0 Complex (extensive) Patient / Family Education for ongoing care []  - 0 Staff obtains Programmer, systems, Records, Test Results / Process Orders []  - 0 Staff telephones HHA, Nursing Homes / Clarify orders / etc []  - 0 Routine Transfer to another Facility (non-emergent condition) []  - 0 Routine Hospital Admission (non-emergent condition) []  - 0 New Admissions / Biomedical engineer / Ordering NPWT, Apligraf, etc. []  - 0 Emergency Hospital Admission (emergent condition) X- 1 10 Simple Discharge Coordination []  - 0 Complex (extensive) Discharge Coordination PROCESS - Special Needs []  - Pediatric / Minor Patient Management 0 []  - 0 Isolation Patient Management []  - 0 Hearing / Language / Visual special needs []  - 0 Assessment of Community assistance (transportation, D/C planning, etc.) []  - 0 Additional assistance / Altered mentation []  - 0 Support Surface(s) Assessment (bed, cushion, seat, etc.) INTERVENTIONS - Wound Cleansing / Measurement Freid, Noreen C. ([id number]) []  - 0 Simple Wound Cleansing - one wound X- 2 5 Complex Wound Cleansing - multiple wounds []  - 0 Wound Imaging (photographs  - any number of wounds) []  - 0 Wound Tracing (instead of photographs) []  - 0 Simple Wound Measurement - one wound X- 2 5 Complex Wound Measurement -  multiple wounds INTERVENTIONS - Wound Dressings []  - Small Wound Dressing one or multiple wounds 0 []  - 0 Medium Wound Dressing one or multiple wounds X- 1 20 Large Wound Dressing one or multiple wounds []  - 0 Application of Medications - topical []  - 0 Application of Medications - injection INTERVENTIONS - Miscellaneous []  - External ear exam 0 []  - 0 Specimen Collection (cultures, biopsies, blood, body fluids, etc.) []  - 0 Specimen(s) / Culture(s) sent or taken to Lab for analysis []  - 0 Patient Transfer (multiple staff / Harrel Lemon Lift / Similar devices) []  - 0 Simple Staple / Suture removal (25 or less) []  - 0 Complex Staple / Suture removal (26 or more) []  - 0 Hypo / Hyperglycemic Management (close monitor of Blood Glucose) []  - 0 Ankle / Brachial Index (ABI) - do not check if billed separately X- 1 5 Vital Signs Has the patient been seen at the hospital within the last three years: Yes Total Score: 100 Level Of Care: New/Established - Level 3 Electronic Signature(s) Signed: 02/02/2020 9:53:36 AM By: Grover Canavan Entered By: Grover Canavan on 01/29/2020 11:09:12 Class, [name] ([id number]) -------------------------------------------------------------------------------- Encounter Discharge Information Details Patient Name: [name]. Date of Service: 01/29/2020 10:30 AM Medical Record Number: [id number] Patient Account Number: [id number] Date of Birth/Sex: 09-12-1955 (64 y.o. F) Treating RN: Grover Canavan Primary Care Chan Sheahan: Tandy Gaw Other Clinician: Referring Karyna Bessler: Tandy Gaw Treating Willem Klingensmith/Extender: Melburn Hake, HOYT Weeks in Treatment: 9 Encounter Discharge Information Items Discharge Condition: Stable Ambulatory Status: Ambulatory Discharge Destination: Home Transportation:  Private Auto Accompanied By: alone Schedule Follow-up Appointment: Yes Clinical Summary of Care: Electronic Signature(s) Signed: 02/02/2020 9:53:36 AM By: Grover Canavan Entered By: Grover Canavan on 01/29/2020 11:11:17 Olazabal, [name] ([id number]) -------------------------------------------------------------------------------- Lower Extremity Assessment Details Patient Name: [name]. Date of Service: 01/29/2020 10:30 AM Medical Record Number: [id number] Patient Account Number: [id number] Date of Birth/Sex: 10/04/1955 (64 y.o. F) Treating RN: Grover Canavan Primary Care Ahrianna Siglin: Tandy Gaw Other Clinician: Referring Corvin Sorbo: Tandy Gaw Treating Minnie Legros/Extender: STONE III, HOYT Weeks in Treatment: 9 Edema Assessment Assessed: [Left: Yes] [Right: Yes] Edema: [Left: Yes] [Right: Yes] Ankle Left: Right: Point of Measurement: cm From Medial Instep 53 cm 54 cm Vascular Assessment Pulses: Dorsalis Pedis Palpable: [Left:Yes] [Right:Yes] Posterior Tibial Palpable: [Left:Yes] [Right:Yes] Electronic Signature(s) Signed: 02/02/2020 9:53:36 AM By: Grover Canavan Entered By: Grover Canavan on 01/29/2020 10:59:52 Ewings, [name] ([id number]) -------------------------------------------------------------------------------- Multi Wound Chart Details Patient Name: [name]. Date of Service: 01/29/2020 10:30 AM Medical Record Number: [id number] Patient Account Number: [id number] Date of Birth/Sex: Aug 21, 1955 (64 y.o. F) Treating RN: Grover Canavan Primary Care Clarabel Marion: Tandy Gaw Other Clinician: Referring Antanette Richwine: Tandy Gaw Treating Juel Bellerose/Extender: STONE III, HOYT Weeks in Treatment: 9 Vital Signs Height(in): Pulse(bpm): 78 Weight(lbs): Blood Pressure(mmHg): 126/70 Body Mass Index(BMI): Temperature(F): 98.2 Respiratory Rate(breaths/min): 18 Wound Assessments Treatment Notes Electronic Signature(s) Signed: 02/02/2020 9:53:36 AM By:  Grover Canavan Entered By: Grover Canavan on 01/29/2020 11:04:26 Soffer, [name] ([id number]) -------------------------------------------------------------------------------- Multi-Disciplinary Care Plan Details Patient Name: [name]. Date of Service: 01/29/2020 10:30 AM Medical Record Number: [id number] Patient Account Number: [id number] Date of Birth/Sex: [birth year] (64 y.o. F) Treating RN: Grover Canavan Primary Care Amairany Schumpert: Tandy Gaw Other Clinician: Referring Soliana Kitko: Tandy Gaw Treating Jobanny Mavis/Extender: Melburn Hake, HOYT Weeks in Treatment: 9 Active Inactive Abuse / Safety / Falls / Self Care Management Nursing Diagnoses: Potential for falls Goals: Patient will remain injury free related to falls Date Initiated: 11/27/2019 Target Resolution Date: 02/13/2020 Goal Status: Active  Interventions: Assess fall risk on admission and as needed Notes: Orientation to the Wound Care Program Nursing Diagnoses: Knowledge deficit related to the wound healing center program Goals: Patient/caregiver will verbalize understanding of the [facility] Program Date Initiated: 11/27/2019 Target Resolution Date: 02/13/2020 Goal Status: Active Interventions: Provide education on orientation to the wound center Notes: Venous Leg Ulcer Nursing Diagnoses: Potential for venous Insuffiency (use before diagnosis confirmed) Goals: Patient will maintain optimal edema control Date Initiated: 11/27/2019 Target Resolution Date: 02/13/2020 Goal Status: Active Interventions: Compression as ordered Notes: Electronic Signature(s) Signed: 02/02/2020 9:53:36 AM By: Grover Canavan Entered By: Grover Canavan on 01/29/2020 11:04:14 Bashor, [name] ([id number]) -------------------------------------------------------------------------------- Non-Wound Condition Assessment Details Patient Name: [name]. Date of Service: 01/29/2020 10:30 AM Medical Record Number:  [id number] Patient Account Number: [id number] Date of Birth/Sex: 10-26-1955 (64 y.o. F) Treating RN: Grover Canavan Primary Care Eivin Mascio: Tandy Gaw Other Clinician: Referring Gweneth Fredlund: Tandy Gaw Treating Kaelei Wheeler/Extender: STONE III, HOYT Weeks in Treatment: 9 Non-Wound Condition: Condition: Lymphedema Location: Leg Side: Right Photos Notes circumferential 54 Electronic Signature(s) Signed: 02/02/2020 9:53:36 AM By: Grover Canavan Entered By: Grover Canavan on 01/29/2020 10:57:35 Jakel, [name] ([id number]) -------------------------------------------------------------------------------- Non-Wound Condition Assessment Details Patient Name: [name] Date of Service: 01/29/2020 10:30 AM Medical Record Number: [id number] Patient Account Number: [id number] Date of Birth/Sex: 1955-08-05 (64 y.o. F) Treating RN: Grover Canavan Primary Care Mackinzee Roszak: Tandy Gaw Other Clinician: Referring Aiyanna Awtrey: Tandy Gaw Treating Johnnae Impastato/Extender: STONE III, HOYT Weeks in Treatment: 9 Non-Wound Condition: Condition: Lymphedema Location: Leg Side: Left Photos Notes circumferential 53 Electronic Signature(s) Signed: 02/02/2020 9:53:36 AM By: Grover Canavan Entered By: Grover Canavan on 01/29/2020 10:58:23 Prestia, [name] ([id number]) -------------------------------------------------------------------------------- Pain Assessment Details Patient Name: [name] Date of Service: 01/29/2020 10:30 AM Medical Record Number: [id number] Patient Account Number: [id number] Date of Birth/Sex: 1956-03-20 (64 y.o. F) Treating RN: Grover Canavan Primary Care Malone Vanblarcom: Tandy Gaw Other Clinician: Referring Meerab Maselli: Tandy Gaw Treating Zella Dewan/Extender: STONE III, HOYT Weeks in Treatment: 9 Active Problems Location of Pain Severity and Description of Pain Patient Has Paino Yes Site Locations Pain Location: Generalized Pain Duration of the  Pain. Constant / Intermittento Intermittent Rate the pain. Current Pain Level: 2 Character of Pain Describe the Pain: Aching Pain Management and Medication Current Pain Management: Electronic Signature(s) Signed: 02/02/2020 9:53:36 AM By: Grover Canavan Entered By: Grover Canavan on 01/29/2020 10:49:59 Minerva, [name] ([id number]) -------------------------------------------------------------------------------- Patient/Caregiver Education Details Patient Name: [name]. Date of Service: 01/29/2020 10:30 AM Medical Record Number: [id number] Patient Account Number: [id number] Date of Birth/Gender: 1956/01/15 (64 y.o. F) Treating RN: Grover Canavan Primary Care Physician: Tandy Gaw Other Clinician: Referring Physician: Tandy Gaw Treating Physician/Extender: Sharalyn Ink in Treatment: 9 Education Assessment Education Provided To: Patient Education Topics Provided Wound/Skin Impairment: Handouts: Skin Care Do's and Dont's Spanish Methods: Explain/Verbal Responses: State content correctly Electronic Signature(s) Signed: 02/02/2020 9:53:36 AM By: Grover Canavan Entered By: Grover Canavan on 01/29/2020 11:09:45 Lukasiewicz, [name] ([id number]) -------------------------------------------------------------------------------- Vitals Details Patient Name: [name]. Date of Service: 01/29/2020 10:30 AM Medical Record Number: [id number] Patient Account Number: [id number] Date of Birth/Sex: 1956-05-08 (64 y.o. F) Treating RN: Grover Canavan Primary Care Sherlynn Tourville: Tandy Gaw Other Clinician: Referring Abi Shoults: Tandy Gaw Treating Ambera Fedele/Extender: STONE III, HOYT Weeks in Treatment: 9 Vital Signs Time Taken: 10:49 Temperature (F): 98.2 Pulse (bpm): 78 Respiratory Rate (breaths/min): 18 Blood Pressure (mmHg): 126/70 Reference Range: 80 - 120 mg / dl Electronic Signature(s) Signed: 02/02/2020 9:53:36 AM By: Grover Canavan Entered  By:  Grover Canavan on 01/29/2020 10:49:51

## 2020-02-04 ENCOUNTER — Encounter: Payer: Medicaid Other | Admitting: Physician Assistant

## 2020-02-04 ENCOUNTER — Other Ambulatory Visit: Payer: Self-pay

## 2020-02-04 DIAGNOSIS — E11622 Type 2 diabetes mellitus with other skin ulcer: Secondary | ICD-10-CM | POA: Diagnosis not present

## 2020-02-04 NOTE — Progress Notes (Addendum)
[name] ([id number]) Visit Report for 02/04/2020 Chief Complaint Document Details Patient Name: [name], [name]. Date of Service: 02/04/2020 3:00 PM Medical Record Number: [id number] Patient Account Number: [id number] Date of Birth/Sex: 04-10-1956 (64 y.o. F) Treating RN: Cornell Barman Primary Care Provider: Tandy Gaw Other Clinician: Referring Provider: Tandy Gaw Treating Provider/Extender: Melburn Hake, Jarrod Bodkins Weeks in Treatment: 9 Information Obtained from: Patient Chief Complaint Bilateral LE lymphedema Electronic Signature(s) Signed: 02/04/2020 3:06:29 PM By: Worthy Keeler PA-C Entered By: Worthy Keeler on 02/04/2020 15:06:28 [name], [name] ([id number]) -------------------------------------------------------------------------------- HPI Details Patient Name: [name] Date of Service: 02/04/2020 3:00 PM Medical Record Number: [id number] Patient Account Number: [id number] Date of Birth/Sex: [birth year] (64 y.o. F) Treating RN: Cornell Barman Primary Care Provider: Tandy Gaw Other Clinician: Referring Provider: Tandy Gaw Treating Provider/Extender: STONE III, Larsen Zettel Weeks in Treatment: 9 History of Present Illness HPI Description: The patient is a 64 year old female with history of hypertension and a long-standing history of bilateral lower extremity lymphedema (first presented on 4/2) . She has had open ulcers in the past which have always responded to compression therapy. She had briefly been to a lymphedema clinic in the past which helped her at the time. this time around she stopped treatment of her lymphedema pumps approximately 2 weeks ago because of some pain in the knees and then noticed the right leg getting worse. She was seen by her PCP who put her on clindamycin 4 times a day 2 days ago. The patient has seen [facility] and Dr. Delana Meyer had seen her last year where a vascular study including venous and arterial duplex studies were within normal limits. he  had recommended compression stockings and lymphedema pumps and the patient has been using this in about 2 weeks ago. She is known to be diabetic but in the past few time she's gone to her primary care doctor her hemoglobin A1c has been normal. 02/11/2015 - after her last visit she took my advice and went to the ER regarding the progressive cellulitis of her right lower extremity and she was admitted between July 17 and 22nd. She received IV antibiotics and then was sent home on a course of steroid-induced and oral antibiotics. She has improved much since then. 02/17/2015 -- she has been doing fine and the weeping of her legs has remarkably gone down. She has no fresh issues. READMISSION 01/15/18 This patient was given this clinic before most recently in 2016 seen by Dr. Con Memos. She has massive bilateral lymphedema and over the last 2 months this had weeping edema out of the left leg. She has compression pumps but her compliance with these has been minimal. She has [facility] they've been using TCA/ABDs/kerlix under an Ace wrap.she has had recent problems with cellulitis. She was apparently seen in the ER and 12/23/17 and given clindamycin. She was then followed by her primary doctor and given doxycycline and Keflex. The pain seems to have settled down. In April 2018 the patient had arterial studies done at [facility]. This showed triphasic waveforms throughout the right leg and mostly triphasic waveforms on the left except for monophasic at the posterior tibial artery distally. She was not felt to have evidence of right lower extremity arterial stenosis or significant problems on the left side. 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 She also had a right lower extremity venous Doppler in January 2018 this was limited by the patient's body habitus and lymphedema. Most of the  proximal veins were not visualized The patient presents with an area of  denuded skin on the anterior medial part of the left calf. There is weeping edema fluid here. 01/22/18; the patient has somewhat better edema control using her compression pumps twice a day and as a result she has much better epithelialization on the left anterior calf area. Only a small open area remains. 01/29/18; the patient has been compliant with her compression pumps. Both the areas on her calf that healed. The remaining area on the left anterior leg is fully epithelialized Readmission: 02/20/2019 upon evaluation today patient presents for reevaluation due to issues that she is having with the bilateral lower extremities. She actually has wounds open on both legs. On the right she has an area in the crease of her leg on the right around the knee region which is actually draining quite a bit and actually has some fungal type appearance to it. She has been on nystatin powder that seems to have helped to some degree. In regard to the left lower extremity this is actually in the lower portion of her leg closer to the ankle and again is continuing to drain as well unfortunately. There does not appear to be any signs of active infection at this time which is good news. No fevers, chills, nausea, vomiting, or diarrhea. She tells me that since she was seen last year she is actually been doing quite well for the most part with regard to her lower extremities. Unfortunately she now is experiencing a little bit more drainage at this time. She is concerned about getting this under control so that it does not get significantly worse. 02/27/2019 on evaluation today patient appears to be doing somewhat better in regard to her bilateral lower extremity wounds. She has been tolerating the dressing changes without complication. Fortunately there is no signs of active infection at this point. No fevers, chills, nausea, vomiting, or diarrhea. She did get her dressing supplies which is excellent news she was extremely  excited to get these. She also got paperwork from [employer] for their financial assistance program where they may be able to help her out in the future if needed with supplies at discounted prices. 03/06/2019 on evaluation today patient appears to be doing a little worse with regard to both areas of weeping on her bilateral lower extremities. This is around the right medial knee and just above the left ankle. With that being said she is unfortunately not doing as well as I would like to see. I feel like she may need to potentially go see someone at the lymphedema clinic as the wraps that she needs or even beyond what we can do here at the wound care center. She really does not have wounds she just has open areas of weeping that are causing some difficulty for her. Subsequently because of this and the moisture I am concerned about the potential for infection I am going to likely give her a prophylactic antibiotic today, Keflex, just to be on the safe side. Nonetheless again there is no obvious signs of active infection at this time. 03/13/2019 on evaluation today patient appears to be doing well with regard to her bilateral lower extremities where she has been weeping compared to even last week's evaluation. I see some areas of new skin growth which is excellent and overall I am very pleased with how things seem to be progressing. No fevers, chills, nausea, vomiting, or diarrhea. MAJESTI, GAMBRELL ([id number]) 03/20/2019 on evaluation  today patient unfortunately is continuing to have issues with significant edema of the left lower extremity. Her right side seems to be doing much better. Unfortunately her left side is showing increased weeping of the lower portion of her leg. This is quite unfortunate obviously we were hoping to get her into the lymphedema clinic they really do not seem to when I see her how if she is draining. Despite the fact this is really not wound related but more lymphedema weeping  related. Nonetheless I do not know that this can be helpful for her to even go for that appointment since again I am not sure there is much that they would actually do at this point. We may need to try a 4 layer compression wrap as best we can on her leg. She is on the Augmentin currently although I am still concerned about whether or not there could be potentially something going on infection wise I would obtain a culture though I understand is not the best being that is a surface culture I just 1 to make sure I do not seem to be missing anything. 03/27/2019 on evaluation today patient appears to be doing much better in regard to the left lower extremity compared to last week. Last week she had tremendous weeping which I think was subsequent to infection now she seems to be doing much better and very pleased. This is not completely healed but there is a lot of new skin growth and it has dried out quite a bit. Overall I think that we are doing well with how things are moving along at this time. No fevers, chills, nausea, vomiting, or diarrhea. 04/03/2019 evaluation today patient appears to be doing a little worse this week compared to last time I saw her. I think this may be due to the fact that she is having issues with not being able to sleep in her bed at least not until last night. She is therefore been in a lift chair and subsequently has also had issues with not been able to use her pumps since she could not get in bed. With that being said the patient overall seems to be doing okay I do think I may want extend the antibiotic for a little bit longer at least until we can see if her edema and her weeping gets better and if it is then obviously I can always discontinue the antibiotics as of next week however I want her to continue to have it over the next week. 04/10/2019 on evaluation today patient unfortunately is still doing poorly with regard to her left lower extremity. Her right is all things  considering doing fairly well. On the left however she continues to have spreading of the area of infection and weeping which appears to be even a larger surface area than noted last week. She did have a positive culture for Pseudomonas in particular which seems to have been of concern she still has green/yellow discharge consistent with Pseudomonas and subsequently a tremendous amount of it. This has me obviously still concerned about the infection not really clearing up despite the fact that on culture it appears the Cipro should have been a good option for treating this. I think she may at this point need IV antibiotics since things are not doing better I do not want to get worse and cause sepsis. She is in agreement with the plan and believes as well that she likely does need to go to the hospital for  IV vancomycin. Or something of the like depending on what the recommendation is from the ER. 04/17/2019 on evaluation today patient appears to be doing excellent in regard to her lower extremity on the left. She was in the hospital for several days from when I sent her last we saw her until just this past Tuesday. Fortunately her drainage is significantly improved and in fact is mostly clear. There is just a couple small areas that may still drain a little bit she states that the [facility] they prescribed for her at discharge she went picked up from pharmacy and got home but has not been able to find it since. She is looked everywhere. She is wondering if I will replace that for her today I will be more than happy to do that. 05/01/2019 on evaluation today patient actually appears to be doing quite well with regard to her lower extremities. She occasionally is having areas that will leak and then heal up mainly when a piece of the fibrotic skin pops off but fortunately she is not having any signs of active infection at this time. Overall she also really does not have any obvious weeping at this time. I  do believe however she really needs some compression wraps and I think this may be a good time to get her back to the lymphedema clinic. 05/11/2019 on evaluation today patient actually appears to be doing quite well with regard to her bilateral lower extremities. She occasionally will have a small area that we per another but in general seems to be completely healed which is great news. Overall very pleased with how everything seems to be progressing. She does have her appointment with lymphedema clinic on November 18. 05/25/2019 on evaluation today patient appears to be doing well with regard to her left lower extremity. I am very pleased in this regard. In regard to her right leg this actually did start draining more I think it is mainly due to the fact that her leg is more swollen. I am not seeing any obvious signs of infection at this time although that is definitely something were obviously acutely aware of simply due to the fact that she had an issue not too far back with exactly this issue. Nonetheless I do feel like that lymphedema clinic would still be beneficial for her. I explained obviously if they are not able to do anything treatment wise on the right leg we could at least have them treat her left leg and then proceed from there. The patient is really in agreement with that plan. If they are able to do both as the drainage slows down that I would be happy to let them handle both. 06/01/2019 on evaluation today patient unfortunately appears to be doing worse with regard to her right lower extremity. The left lower extremity is still maintaining at this point. Unfortunately she has been having significantly increased pain over the past several days and has been experiencing as well increased swelling of the right lower extremity. I really do not know that I am seeing anything that appears to be obvious for infection at this point to be peripherally honest. With that being said the patient  does seem to be having much more swelling that she is even experienced in the past and coupled with increased pain in her hip as well I am concerned that again she could potentially have a DVT although I am not 100% sure of this. I think it something that may need to  be checked out. We discussed the possibility of sending her for a DVT study through the hospital but unfortunately transportation is an issue if she does have a DVT I do not want her to wait days to be able to get in for that test however if she has this scheduled as an outpatient that is as fast that she will be able to get the test scheduled for transportation purposes. That will also fall on Thanksgiving so subsequently she did actually be looking at either Friday or even next week before we would know anything back from this. That is much too long in my opinion. Subsequent to the amount of discomfort she is experiencing the patient is actually okay with going to the ER for evaluation today. 06/12/2019 on evaluation today patient actually appears to be doing significantly better compared to last time I saw her. Following when I last saw her she was actually in the hospital from that Monday until the following Sunday almost 1 full week. She actually was placed on Keflex in the hospital following the time for her to be discharged and Dr. Steva Ready has recommended 2 times a day dosing of the Keflex for the next year in order to help with more prophylactic/preventative measures with regard to her developing cellulitis. Overall I think this sounds like an excellent plan. The patient unfortunately is good to have trouble being treated at lymphedema clinic due to the fact that she really cannot get up on the bed that they have there. They also state that they cannot manage her as long as she has anything draining at this point. Obviously that is somewhat unfortunate as she does need help with edema control but nonetheless we will have to do  what we can for her outside of it sounds like the lymphedema clinic scenario at this point. 06/19/2019 on evaluation today patient appears to be doing fairly well with regard to her bilateral lower extremities. She is not nearly as swollen and shows no signs of infection at this point. There is no evidence of cellulitis whatsoever. She also has no open wounds or draining at this point which is also good news. No fever chills noted. She seems to be in very good spirits and in fact appears to be doing quite well. READMISSION 11/27/2019 [name], [name] ([id number]) This is a 64 year old woman that we have had in this clinic several times before including 2015, 16 and 19 and then most recently from 03/20/2019 through 06/19/2019 with bilateral lower extremity lymphedema. She has had previous arterial and reflux studies done years ago which were not all that remarkable. 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 She does have a positive family history and she had large legs starting may be in her 47s. She was recently in hospital from 10/20/2019 through 10/28/2019 with right leg cellulitis. She was given Ancef and clindamycin and then Zosyn when a culture showed Pseudomonas. At that time there was purulent drainage. She was followed by infectious disease Dr Steva Ready. The patient is now back at home. She has noted increased swelling in the right and no drainage in her right leg mostly on the posterior medial aspect in the calf area. She has not had pain or fever. She has literally been improved lysing above dressings because her at the area of this is far too large for standard compression. She has been wrapping the areas with sheets to resorptive pads. She is found these helped  somewhat. She does have an appointment with the lymphedema clinic in [place] in late June. Past medical history includes bilateral lymphedema, hypertension, obstructive  sleep apnea with CPAP. Recent hospitalization with apparently Pseudomonas cellulitis of the right lower leg 12/15/2019 upon evaluation today patient appears to be doing a little bit worse in regard to her right lower extremity. Unfortunately she is having more weeping down in the lower portion of her leg. Fortunately there is no signs of active infection at this time. No fever chills noted. The patient states she is not having increased pain except for when she attempted to use the lymphedema pumps unfortunately she states that she did have pain when she did this. Otherwise we been using absorptive dressings of one type or another she is using diapers at home and then subsequently Ace wraps. In regard to the barrier cream we have discussed the possibility of derma cloud which she would like to try I do not have a problem with that. 12/22/2019 upon evaluation today patient actually appears to be doing better in regard to her leg ulcers at this point. Fortunately there does not appear to be any signs of active infection which is great news and I am extremely pleased with where things are progressing at this time. There is no sign of active infection currently. The patient is very pleased to see things doing so well. 12/29/2019 upon evaluation today patient appears to be doing a little bit better in regard to her weeping in general over her lower extremities. She does have some signs of mild erythema little bit more than what I noted last week or rather last visit. Nonetheless I think that my threshold for switching her antibiotics from Keflex to something else is very low at this point considering that she has had such severe infections in the past that seem to come almost out of nowhere. There is a little erythema and warmth noted of the lower portion of her leg compared to the upper which also makes me want to go ahead and address things more rapidly at this point. Likely I would switch out the Keflex for  something like Levaquin ideally. 7/16; patient with severe bilateral lymphedema. She has superficial wounds albeit almost circumferential now on the left lateral lower leg. This may be new from last time. Small area on the right anterior lower leg and then another area on the right medial lower leg and of pannus fold. She has been using various absorptive garments. She states she is using her compression pumps once a day occasionally twice. Culture from her last visit here was negative 01/29/2020 on evaluation today patient appears to be doing excellent at this point in regard to her legs with regard to infection I see no signs of active infection at this point. She still does have unfortunately areas of weeping this is minimal on the right now her left is actually significantly worse although I do not think it is as bad as last week with Dr. Dellia Nims saw her. She has been trying to pump and elevate her legs is much as possible. She has previously been on the Keflex and in the past for prevention that seems to do fairly well and likely can extend that today. 02/04/2020 on evaluation today patient appears to be doing better in regard to her legs bilaterally. Fortunately there is no signs of active infection at this time which is great news and overall she has less weeping on the left compared to the  right and there is several spots where she is pretty much sealed up with no draining regions. Overall very pleased in this regard. Electronic Signature(s) Signed: 02/04/2020 3:31:43 PM By: Worthy Keeler PA-C Entered By: Worthy Keeler on 02/04/2020 15:31:43 [name], [name] ([id number]) -------------------------------------------------------------------------------- Physical Exam Details Patient Name: [name], [name] Date of Service: 02/04/2020 3:00 PM Medical Record Number: [id number] Patient Account Number: [id number] Date of Birth/Sex: 09-18-1955 (64 y.o. F) Treating RN: Cornell Barman Primary Care  Provider: Tandy Gaw Other Clinician: Referring Provider: Tandy Gaw Treating Provider/Extender: STONE III, Amaury Kuzel Weeks in Treatment: 9 Constitutional Obese and well-hydrated in no acute distress. Respiratory normal breathing without difficulty. Psychiatric this patient is able to make decisions and demonstrates good insight into disease process. Alert and Oriented x 3. pleasant and cooperative. Notes Patient skin breakdown in general she showed signs of significant improvement she has been pumping actually up to 3 times a day and I think that coupled with elevation at night has done very well for her. I am very pleased with overall what we are seeing at this point. Electronic Signature(s) Signed: 02/04/2020 3:32:03 PM By: Worthy Keeler PA-C Entered By: Worthy Keeler on 02/04/2020 15:32:02 [name], [name] ([id number]) -------------------------------------------------------------------------------- Physician Orders Details Patient Name: [name] Date of Service: 02/04/2020 3:00 PM Medical Record Number: [id number] Patient Account Number: [id number] Date of Birth/Sex: [birth year] (64 y.o. 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 - and as needed Follow-up Appointments o Return Appointment in 1 week. Edema Control o Elevate legs to the level of the heart and pump ankles as often as  possible o Compression Pump: Use compression pump on left lower extremity for 60 minutes, twice daily. o Compression Pump: Use compression pump on right lower extremity for 60 minutes, twice daily. o Other: - ACE wraps Electronic Signature(s) Signed: 02/04/2020 4:58:49 PM By: Grover Canavan Signed: 02/04/2020 5:01:28 PM By: Worthy Keeler PA-C Entered By: Grover Canavan on 02/04/2020 15:28:28 [name], [name] ([id number]) -------------------------------------------------------------------------------- Problem List Details Patient Name: [name], [name]. Date of Service: 02/04/2020 3:00 PM Medical Record Number: [id number] Patient Account Number: [id number] Date of Birth/Sex: 1956-03-20 (64 y.o. 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 Date of Service: 02/04/2020 3:00 PM Medical Record Number: [id number] Patient Account Number: [id number] Date of Birth/Sex: 1956/05/15 (64 y.o. F) Treating RN: Cornell Barman Primary Care Provider: Sharrell Ku,  LINDA Other Clinician: Referring Provider: Sharrell Ku, LINDA Treating Provider/Extender: STONE III, Delphin Funes Weeks in Treatment:  9 Subjective Chief Complaint Information obtained from Patient Bilateral LE lymphedema History of Present Illness (HPI) The patient is a 64 year old female with history of hypertension and a long-standing history of bilateral lower extremity lymphedema (first presented on 4/2) . She has had open ulcers in the past which have always responded to compression therapy. She had briefly been to a lymphedema clinic in the past which helped her at the time. this time around she stopped treatment of her lymphedema pumps approximately 2 weeks ago because of some pain in the knees and then noticed the right leg getting worse. She was seen by her PCP who put her on clindamycin 4 times a day 2 days ago. The patient has seen [facility] and Dr. Delana Meyer had seen her last year where a vascular study including venous and arterial duplex studies were within normal limits. he had recommended compression stockings and lymphedema pumps and the patient has been using this in about 2 weeks ago. She is known to be diabetic but in the past few time she's gone to her primary care doctor her hemoglobin A1c has been normal. 02/11/2015 - after her last visit she took my advice and went to the ER regarding the progressive cellulitis of her right lower extremity and she was admitted between July 17 and 22nd. She received IV antibiotics and then was sent home on a course of steroid-induced and oral antibiotics. She has improved much since then. 02/17/2015 -- she has been doing fine and the weeping of her legs has remarkably gone down. She has no fresh issues. READMISSION 01/15/18 This patient was given this clinic before most recently in 2016 seen by Dr. Con Memos. She has massive bilateral lymphedema and over the last 2 months this had weeping edema out of the left leg. She has compression pumps but her compliance with these has been minimal. She has [facility] they've been using TCA/ABDs/kerlix under an Ace wrap.she has had  recent problems with cellulitis. She was apparently seen in the ER and 12/23/17 and given clindamycin. She was then followed by her primary doctor and given doxycycline and Keflex. The pain seems to have settled down. In April 2018 the patient had arterial studies done at [facility]. This showed triphasic waveforms throughout the right leg and mostly triphasic waveforms on the left except for monophasic at the posterior tibial artery distally. She was not felt to have evidence of right lower extremity arterial stenosis or significant problems on the left side. 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 She also had a right lower extremity venous Doppler in January 2018 this was limited by the patient's body habitus and lymphedema. Most of the proximal veins were not visualized The patient presents with an area of denuded skin on the anterior medial part of the left calf. There is weeping edema fluid here. 01/22/18; the patient has somewhat better edema control using her compression pumps twice a day and as a result she has much better epithelialization on the left anterior calf area. Only a small open area remains. 01/29/18; the patient has been compliant with her compression pumps. Both the areas on her calf that healed. The remaining area on the left anterior leg is fully epithelialized Readmission: 02/20/2019 upon evaluation today patient presents for reevaluation due to issues that she is having with the bilateral lower extremities. She actually has wounds open on  both legs. On the right she has an area in the crease of her leg on the right around the knee region which is actually draining quite a bit and actually has some fungal type appearance to it. She has been on nystatin powder that seems to have helped to some degree. In regard to the left lower extremity this is actually in the lower portion of her leg closer to the ankle and again is continuing to  drain as well unfortunately. There does not appear to be any signs of active infection at this time which is good news. No fevers, chills, nausea, vomiting, or diarrhea. She tells me that since she was seen last year she is actually been doing quite well for the most part with regard to her lower extremities. Unfortunately she now is experiencing a little bit more drainage at this time. She is concerned about getting this under control so that it does not get significantly worse. 02/27/2019 on evaluation today patient appears to be doing somewhat better in regard to her bilateral lower extremity wounds. She has been tolerating the dressing changes without complication. Fortunately there is no signs of active infection at this point. No fevers, chills, nausea, vomiting, or diarrhea. She did get her dressing supplies which is excellent news she was extremely excited to get these. She also got paperwork from [employer] for their financial assistance program where they may be able to help her out in the future if needed with supplies at discounted prices. 03/06/2019 on evaluation today patient appears to be doing a little worse with regard to both areas of weeping on her bilateral lower extremities. This is around the right medial knee and just above the left ankle. With that being said she is unfortunately not doing as well as I would like to see. I feel like she may need to potentially go see someone at the lymphedema clinic as the wraps that she needs or even beyond what we can do here at the wound care center. She really does not have wounds she just has open areas of weeping that are causing some difficulty for her. Subsequently because of this and the moisture I am concerned about the potential for infection I am going to likely give her a prophylactic antibiotic today, Keflex, just to be on the safe side. Nonetheless again there is no obvious signs of active infection at this time. [name], [name]  ([id number]) 03/13/2019 on evaluation today patient appears to be doing well with regard to her bilateral lower extremities where she has been weeping compared to even last week's evaluation. I see some areas of new skin growth which is excellent and overall I am very pleased with how things seem to be progressing. No fevers, chills, nausea, vomiting, or diarrhea. 03/20/2019 on evaluation today patient unfortunately is continuing to have issues with significant edema of the left lower extremity. Her right side seems to be doing much better. Unfortunately her left side is showing increased weeping of the lower portion of her leg. This is quite unfortunate obviously we were hoping to get her into the lymphedema clinic they really do not seem to when I see her how if she is draining. Despite the fact this is really not wound related but more lymphedema weeping related. Nonetheless I do not know that this can be helpful for her to even go for that appointment since again I am not sure there is much that they would actually do at this point. We may  need to try a 4 layer compression wrap as best we can on her leg. She is on the Augmentin currently although I am still concerned about whether or not there could be potentially something going on infection wise I would obtain a culture though I understand is not the best being that is a surface culture I just 1 to make sure I do not seem to be missing anything. 03/27/2019 on evaluation today patient appears to be doing much better in regard to the left lower extremity compared to last week. Last week she had tremendous weeping which I think was subsequent to infection now she seems to be doing much better and very pleased. This is not completely healed but there is a lot of new skin growth and it has dried out quite a bit. Overall I think that we are doing well with how things are moving along at this time. No fevers, chills, nausea, vomiting, or  diarrhea. 04/03/2019 evaluation today patient appears to be doing a little worse this week compared to last time I saw her. I think this may be due to the fact that she is having issues with not being able to sleep in her bed at least not until last night. She is therefore been in a lift chair and subsequently has also had issues with not been able to use her pumps since she could not get in bed. With that being said the patient overall seems to be doing okay I do think I may want extend the antibiotic for a little bit longer at least until we can see if her edema and her weeping gets better and if it is then obviously I can always discontinue the antibiotics as of next week however I want her to continue to have it over the next week. 04/10/2019 on evaluation today patient unfortunately is still doing poorly with regard to her left lower extremity. Her right is all things considering doing fairly well. On the left however she continues to have spreading of the area of infection and weeping which appears to be even a larger surface area than noted last week. She did have a positive culture for Pseudomonas in particular which seems to have been of concern she still has green/yellow discharge consistent with Pseudomonas and subsequently a tremendous amount of it. This has me obviously still concerned about the infection not really clearing up despite the fact that on culture it appears the Cipro should have been a good option for treating this. I think she may at this point need IV antibiotics since things are not doing better I do not want to get worse and cause sepsis. She is in agreement with the plan and believes as well that she likely does need to go to the hospital for IV vancomycin. Or something of the like depending on what the recommendation is from the ER. 04/17/2019 on evaluation today patient appears to be doing excellent in regard to her lower extremity on the left. She was in the hospital  for several days from when I sent her last we saw her until just this past Tuesday. Fortunately her drainage is significantly improved and in fact is mostly clear. There is just a couple small areas that may still drain a little bit she states that the [facility] they prescribed for her at discharge she went picked up from pharmacy and got home but has not been able to find it since. She is looked everywhere. She is wondering if I  will replace that for her today I will be more than happy to do that. 05/01/2019 on evaluation today patient actually appears to be doing quite well with regard to her lower extremities. She occasionally is having areas that will leak and then heal up mainly when a piece of the fibrotic skin pops off but fortunately she is not having any signs of active infection at this time. Overall she also really does not have any obvious weeping at this time. I do believe however she really needs some compression wraps and I think this may be a good time to get her back to the lymphedema clinic. 05/11/2019 on evaluation today patient actually appears to be doing quite well with regard to her bilateral lower extremities. She occasionally will have a small area that we per another but in general seems to be completely healed which is great news. Overall very pleased with how everything seems to be progressing. She does have her appointment with lymphedema clinic on November 18. 05/25/2019 on evaluation today patient appears to be doing well with regard to her left lower extremity. I am very pleased in this regard. In regard to her right leg this actually did start draining more I think it is mainly due to the fact that her leg is more swollen. I am not seeing any obvious signs of infection at this time although that is definitely something were obviously acutely aware of simply due to the fact that she had an issue not too far back with exactly this issue. Nonetheless I do feel like that  lymphedema clinic would still be beneficial for her. I explained obviously if they are not able to do anything treatment wise on the right leg we could at least have them treat her left leg and then proceed from there. The patient is really in agreement with that plan. If they are able to do both as the drainage slows down that I would be happy to let them handle both. 06/01/2019 on evaluation today patient unfortunately appears to be doing worse with regard to her right lower extremity. The left lower extremity is still maintaining at this point. Unfortunately she has been having significantly increased pain over the past several days and has been experiencing as well increased swelling of the right lower extremity. I really do not know that I am seeing anything that appears to be obvious for infection at this point to be peripherally honest. With that being said the patient does seem to be having much more swelling that she is even experienced in the past and coupled with increased pain in her hip as well I am concerned that again she could potentially have a DVT although I am not 100% sure of this. I think it something that may need to be checked out. We discussed the possibility of sending her for a DVT study through the hospital but unfortunately transportation is an issue if she does have a DVT I do not want her to wait days to be able to get in for that test however if she has this scheduled as an outpatient that is as fast that she will be able to get the test scheduled for transportation purposes. That will also fall on Thanksgiving so subsequently she did actually be looking at either Friday or even next week before we would know anything back from this. That is much too long in my opinion. Subsequent to the amount of discomfort she is experiencing the patient is actually okay  with going to the ER for evaluation today. 06/12/2019 on evaluation today patient actually appears to be doing  significantly better compared to last time I saw her. Following when I last saw her she was actually in the hospital from that Monday until the following Sunday almost 1 full week. She actually was placed on Keflex in the hospital following the time for her to be discharged and Dr. Steva Ready has recommended 2 times a day dosing of the Keflex for the next year in order to help with more prophylactic/preventative measures with regard to her developing cellulitis. Overall I think this sounds like an excellent plan. The patient unfortunately is good to have trouble being treated at lymphedema clinic due to the fact that she really cannot get up on the bed that they have there. They also state that they cannot manage her as long as she has anything draining at this point. Obviously that is somewhat unfortunate as she does need help with edema control but nonetheless we will have to do what we can for her outside of it sounds like the lymphedema clinic scenario at this point. 06/19/2019 on evaluation today patient appears to be doing fairly well with regard to her bilateral lower extremities. She is not nearly as swollen [name], [name] ([id number]) and shows no signs of infection at this point. There is no evidence of cellulitis whatsoever. She also has no open wounds or draining at this point which is also good news. No fever chills noted. She seems to be in very good spirits and in fact appears to be doing quite well. READMISSION 11/27/2019 This is a 64 year old woman that we have had in this clinic several times before including 2015, 16 and 19 and then most recently from 03/20/2019 through 06/19/2019 with bilateral lower extremity lymphedema. She has had previous arterial and reflux studies done years ago which were not all that remarkable. 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 She does have a positive family history and she had large legs  starting may be in her 28s. She was recently in hospital from 10/20/2019 through 10/28/2019 with right leg cellulitis. She was given Ancef and clindamycin and then Zosyn when a culture showed Pseudomonas. At that time there was purulent drainage. She was followed by infectious disease Dr Steva Ready. The patient is now back at home. She has noted increased swelling in the right and no drainage in her right leg mostly on the posterior medial aspect in the calf area. She has not had pain or fever. She has literally been improved lysing above dressings because her at the area of this is far too large for standard compression. She has been wrapping the areas with sheets to resorptive pads. She is found these helped somewhat. She does have an appointment with the lymphedema clinic in [place] in late June. Past medical history includes bilateral lymphedema, hypertension, obstructive sleep apnea with CPAP. Recent hospitalization with apparently Pseudomonas cellulitis of the right lower leg 12/15/2019 upon evaluation today patient appears to be doing a little bit worse in regard to her right lower extremity. Unfortunately she is having more weeping down in the lower portion of her leg. Fortunately there is no signs of active infection at this time. No fever chills noted. The patient states she is not having increased pain except for when she attempted to use the lymphedema pumps unfortunately she states that she did have pain when she did this. Otherwise we  been using absorptive dressings of one type or another she is using diapers at home and then subsequently Ace wraps. In regard to the barrier cream we have discussed the possibility of derma cloud which she would like to try I do not have a problem with that. 12/22/2019 upon evaluation today patient actually appears to be doing better in regard to her leg ulcers at this point. Fortunately there does not appear to be any signs of active infection which is  great news and I am extremely pleased with where things are progressing at this time. There is no sign of active infection currently. The patient is very pleased to see things doing so well. 12/29/2019 upon evaluation today patient appears to be doing a little bit better in regard to her weeping in general over her lower extremities. She does have some signs of mild erythema little bit more than what I noted last week or rather last visit. Nonetheless I think that my threshold for switching her antibiotics from Keflex to something else is very low at this point considering that she has had such severe infections in the past that seem to come almost out of nowhere. There is a little erythema and warmth noted of the lower portion of her leg compared to the upper which also makes me want to go ahead and address things more rapidly at this point. Likely I would switch out the Keflex for something like Levaquin ideally. 7/16; patient with severe bilateral lymphedema. She has superficial wounds albeit almost circumferential now on the left lateral lower leg. This may be new from last time. Small area on the right anterior lower leg and then another area on the right medial lower leg and of pannus fold. She has been using various absorptive garments. She states she is using her compression pumps once a day occasionally twice. Culture from her last visit here was negative 01/29/2020 on evaluation today patient appears to be doing excellent at this point in regard to her legs with regard to infection I see no signs of active infection at this point. She still does have unfortunately areas of weeping this is minimal on the right now her left is actually significantly worse although I do not think it is as bad as last week with Dr. Dellia Nims saw her. She has been trying to pump and elevate her legs is much as possible. She has previously been on the Keflex and in the past for prevention that seems to do fairly well  and likely can extend that today. 02/04/2020 on evaluation today patient appears to be doing better in regard to her legs bilaterally. Fortunately there is no signs of active infection at this time which is great news and overall she has less weeping on the left compared to the right and there is several spots where she is pretty much sealed up with no draining regions. Overall very pleased in this regard. Objective Constitutional Obese and well-hydrated in no acute distress. Vitals Time Taken: 2:57 PM, Temperature: 98.2 F, Pulse: 85 bpm, Respiratory Rate: 20 breaths/min, Blood Pressure: 167/91 mmHg. Respiratory normal breathing without difficulty. Psychiatric this patient is able to make decisions and demonstrates good insight into disease process. Alert and Oriented x 3. pleasant and cooperative. General Notes: Patient skin breakdown in general she showed signs of significant improvement she has been pumping actually up to 3 times a day and I think that coupled with elevation at night has done very well for her. I am very  pleased with overall what we are seeing at this point. [name], [name] ([id number]) Other Condition(s) Patient presents with Lymphedema located on the Left Leg. General Notes: 54 circumferential measurement Patient presents with Lymphedema located on the Right Leg. 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 - and as needed Follow-up Appointments: Return Appointment in 1 week. Edema Control: Elevate legs to the level of the heart and pump ankles as often as possible Compression Pump:  Use compression pump on left lower extremity for 60 minutes, twice daily. Compression Pump: Use compression pump on right lower extremity for 60 minutes, twice daily. Other: - ACE wraps 1. I would recommend currently that we continue with the absorptive dressings as before were using Xtrasorb here in the office at home she is using what she can as far as diapers or pads in order to help with absorption. 2. I am also can recommend she continue with the Ace wraps for compression as well as utilization of the compression pumps that she doing this up to 3 times a day she tells me which is excellent is actually got her legs down quite a bit. We will see patient back for reevaluation in 2 weeks here in the clinic. If anything worsens or changes patient will contact our office for additional recommendations. Electronic Signature(s) Signed: 02/04/2020 3:32:32 PM By: Worthy Keeler PA-C Entered By: Worthy Keeler on 02/04/2020 15:32:32 Rasmus, [name] ([id number]) -------------------------------------------------------------------------------- SuperBill Details Patient Name: [name] Date of Service: 02/04/2020 Medical Record Number: [id number] Patient Account Number: [id number] Date of Birth/Sex: 1955/11/13 (64 y.o. 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 to breakdown of skin Facility Procedures CPT4 Code: 63016010 Description: 99213 - WOUND CARE VISIT-LEV 3 EST PT Modifier: Quantity: 1 Physician Procedures CPT4 Code: 9323557 Description: 32202 - WC PHYS LEVEL 3 - EST PT Modifier: Quantity:  1 CPT4 Code: Description: ICD-10 Diagnosis Description 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

## 2020-02-04 NOTE — Progress Notes (Signed)
PETRINA, MELBY ([id number]) Visit Report for 02/04/2020 Arrival Information Details Patient Name: [name], [name]. Date of Service: 02/04/2020 3:00 PM Medical Record Number: [id number] Patient Account Number: [id number] Date of Birth/Sex: February 04, 1956 (64 y.o. F) Treating RN: [name] Primary Care [name]: [name] Other Clinician: Referring [name]: [name] Treating [name]/Extender: [name], [name]: 9 Visit Information History Since Last Visit Added or deleted any medications: No Patient Arrived: [name] Had a fall or experienced change in No Arrival Time: 14:56 activities of daily living that may affect Accompanied By: self risk of falls: Transfer Assistance: None Hospitalized since last visit: No Patient Identification Verified: Yes Pain Present Now: No Secondary Verification Process Completed: Yes Electronic Signature(s) Signed: 02/04/2020 4:58:49 PM By: [name] Entered By: [name] on 02/04/2020 14:57:45 [name] ([id number]) -------------------------------------------------------------------------------- Clinic Level of Care Assessment Details Patient Name: [name]. Date of Service: 02/04/2020 3:00 PM Medical Record Number: [id number] Patient Account Number: [id number] Date of Birth/Sex: [birth year] (64 y.o. F) Treating RN: [name] Primary Care Camela Wich: [name] Other Clinician: Referring [name]: [name] Treating [name]/Extender: [name], [name]: 9 Clinic Level of Care Assessment Items TOOL 4 Quantity Score []  - Use when only an EandM is performed on FOLLOW-UP visit 0 ASSESSMENTS - Nursing Assessment / Reassessment X - Reassessment of Co-morbidities (includes updates in patient status) 1 10 X- 1 5 Reassessment of Adherence to [name] Plan ASSESSMENTS - Wound and Skin Assessment / Reassessment X - Simple Wound Assessment / Reassessment - one wound 1 5 []   - 0 Complex Wound Assessment / Reassessment - multiple wounds []  - 0 Dermatologic / Skin Assessment (not related to wound area) ASSESSMENTS - Focused Assessment []  - Circumferential Edema Measurements - multi extremities 0 []  - 0 Nutritional Assessment / Counseling / Intervention []  - 0 Lower Extremity Assessment (monofilament, tuning fork, pulses) []  - 0 Peripheral Arterial Disease Assessment (using hand held doppler) ASSESSMENTS - Ostomy and/or Continence Assessment and Care []  - Incontinence Assessment and Management 0 []  - 0 Ostomy Care Assessment and Management (repouching, etc.) PROCESS - Coordination of Care X - Simple Patient / Family Education for ongoing care 1 15 []  - 0 Complex (extensive) Patient / Family Education for ongoing care []  - 0 Staff obtains Programmer, systems, Records, Test Results / Process Orders []  - 0 Staff telephones HHA, Nursing Homes / Clarify orders / etc []  - 0 Routine Transfer to another Facility (non-emergent condition) []  - 0 Routine Hospital Admission (non-emergent condition) []  - 0 New Admissions / Biomedical engineer / Ordering NPWT, Apligraf, etc. []  - 0 Emergency Hospital Admission (emergent condition) X- 1 10 Simple Discharge Coordination []  - 0 Complex (extensive) Discharge Coordination PROCESS - Special Needs []  - Pediatric / Minor Patient Management 0 []  - 0 Isolation Patient Management []  - 0 Hearing / Language / Visual special needs []  - 0 Assessment of Community assistance (transportation, D/C planning, etc.) []  - 0 Additional assistance / Altered mentation []  - 0 Support Surface(s) Assessment (bed, cushion, seat, etc.) INTERVENTIONS - Wound Cleansing / Measurement Sick, [name] ([id number]) X- 1 5 Simple Wound Cleansing - one wound []  - 0 Complex Wound Cleansing - multiple wounds X- 1 5 Wound Imaging (photographs - any number of wounds) []  - 0 Wound Tracing (instead of photographs) X- 1 5 Simple Wound  Measurement - one wound []  - 0 Complex Wound Measurement - multiple wounds INTERVENTIONS - Wound Dressings []  - Small Wound Dressing one or  multiple wounds 0 []  - 0 Medium Wound Dressing one or multiple wounds X- 2 20 Large Wound Dressing one or multiple wounds []  - 0 Application of Medications - topical []  - 0 Application of Medications - injection INTERVENTIONS - Miscellaneous []  - External ear exam 0 []  - 0 Specimen Collection (cultures, biopsies, blood, body fluids, etc.) []  - 0 Specimen(s) / Culture(s) sent or taken to Lab for analysis []  - 0 Patient Transfer (multiple staff / Civil Service fast streamer / Similar devices) []  - 0 Simple Staple / Suture removal (25 or less) []  - 0 Complex Staple / Suture removal (26 or more) []  - 0 Hypo / Hyperglycemic Management (close monitor of Blood Glucose) []  - 0 Ankle / Brachial Index (ABI) - do not check if billed separately X- 1 5 Vital Signs Has the patient been seen at the hospital within the last three years: Yes Total Score: 105 Level Of Care: New/Established - Level 3 Electronic Signature(s) Signed: 02/04/2020 4:58:49 PM By: [name] Entered By: [name] on 02/04/2020 15:29:22 [name] ([id number]) -------------------------------------------------------------------------------- Encounter Discharge Information Details Patient Name: [name]. Date of Service: 02/04/2020 3:00 PM Medical Record Number: [id number] Patient Account Number: [id number] Date of Birth/Sex: Jan 20, 1956 (64 y.o. F) Treating RN: [name] Primary Care Merleen Picazo: [name] Other Clinician: Referring Haron Beilke: [name] Treating Ranette Luckadoo/Extender: [name], [name]: 9 Encounter Discharge Information Items Discharge Condition: Stable Ambulatory Status: Walker Discharge Destination: Home Transportation: Private Auto Accompanied By: self Schedule Follow-up Appointment: Yes Clinical Summary of  Care: Electronic Signature(s) Signed: 02/04/2020 4:58:49 PM By: [name] Entered By: [name] on 02/04/2020 15:31:08 [name] ([id number]) -------------------------------------------------------------------------------- Lower Extremity Assessment Details Patient Name: [name]. Date of Service: 02/04/2020 3:00 PM Medical Record Number: [id number] Patient Account Number: [id number] Date of Birth/Sex: [birth year] (64 y.o. F) Treating RN: [name] Primary Care Shakerria Parran: [name] Other Clinician: Referring Dannon Nguyenthi: [name] Treating Gladie Gravette/Extender: STONE III, [name]: 9 Edema Assessment Assessed: [Left: Yes] [Right: Yes] Edema: [Left: Yes] [Right: Yes] Vascular Assessment Pulses: Dorsalis Pedis Palpable: [Left:Yes] [Right:Yes] Posterior Tibial Palpable: [Left:Yes] [Right:Yes] Electronic Signature(s) Signed: 02/04/2020 4:58:49 PM By: [name] Entered By: [name] on 02/04/2020 15:20:25 Veasey, Ellamae [name] ([id number]) -------------------------------------------------------------------------------- Multi Wound Chart Details Patient Name: [name]. Date of Service: 02/04/2020 3:00 PM Medical Record Number: [id number] Patient Account Number: [id number] Date of Birth/Sex: 11/04/1955 (64 y.o. F) Treating RN: [name] Primary Care Kasumi Ditullio: [name] Other Clinician: Referring Corydon Schweiss: [name] Treating Asra Gambrel/Extender: STONE III, [name]: 9 Vital Signs Height(in): Pulse(bpm): 85 Weight(lbs): Blood Pressure(mmHg): 167/91 Body Mass Index(BMI): Temperature(F): 98.2 Respiratory Rate(breaths/min): 20 Wound Assessments [name] Notes Electronic Signature(s) Signed: 02/04/2020 4:58:49 PM By: [name] Entered By: [name] on 02/04/2020 15:28:02 [name]  ([id number]) -------------------------------------------------------------------------------- [place] Details Patient Name: [name]. Date of Service: 02/04/2020 3:00 PM Medical Record Number: [id number] Patient Account Number: [id number] Date of Birth/Sex: January 02, 1956 (64 y.o. F) Treating RN: [name] Primary Care Ran Tullis: [name] Other Clinician: Referring Lashaunta Sicard: [name] Treating Ka Bench/Extender: STONE III, [name]: 9 Active Inactive Abuse / Safety / Falls / Self Care Management Nursing Diagnoses: Potential for falls Goals: Patient will remain injury free related to falls Date Initiated: 11/27/2019 Target Resolution Date: 02/13/2020 Goal Status: Active Interventions: Assess fall risk on admission and as needed Notes: Orientation to the Wound Care Program Nursing Diagnoses: Knowledge deficit related to the wound healing center program  Goals: Patient/caregiver will verbalize understanding of the [facility] Program Date Initiated: 11/27/2019 Target Resolution Date: 02/13/2020 Goal Status: Active Interventions: Provide education on orientation to the wound center Notes: Venous Leg Ulcer Nursing Diagnoses: Potential for venous Insuffiency (use before diagnosis confirmed) Goals: Patient will maintain optimal edema control Date Initiated: 11/27/2019 Target Resolution Date: 02/13/2020 Goal Status: Active Interventions: Compression as ordered Notes: Electronic Signature(s) Signed: 02/04/2020 4:58:49 PM By: [name] Entered By: [name] on 02/04/2020 15:27:55 [place], Ellamae [name] ([id number]) -------------------------------------------------------------------------------- Non-Wound Condition Assessment Details Patient Name: [name]. Date of Service: 02/04/2020 3:00 PM Medical Record Number: [id number] Patient Account Number: [id number] Date of Birth/Sex: [birth year] (64 y.o. F) Treating  RN: [name] Primary Care Sorren Vallier: [name] Other Clinician: Referring Drenda Sobecki: [name] Treating Treniya Lobb/Extender: STONE III, [name]: 9 Non-Wound Condition: Condition: Lymphedema Location: Leg Side: Left Photos Notes 54 circumferential measurement Electronic Signature(s) Signed: 02/04/2020 4:58:49 PM By: [name] Entered By: [name] on 02/04/2020 15:18:56 [place], Ellamae [name] ([id number]) -------------------------------------------------------------------------------- Non-Wound Condition Assessment Details Patient Name: Elizabeth Landau C. Date of Service: 02/04/2020 3:00 PM Medical Record Number: [id number] Patient Account Number: [id number] Date of Birth/Sex: 03-18-1956 (64 y.o. F) Treating RN: [name] Primary Care Laniya Friedl: [name] Other Clinician: Referring Lashae Wollenberg: [name] Treating Ivan Maskell/Extender: STONE III, [name]: 9 Non-Wound Condition: Condition: Lymphedema Location: Leg Side: Right Photos Notes circumferential measurement 61.5 Electronic Signature(s) Signed: 02/04/2020 4:58:49 PM By: [name] Entered By: [name] on 02/04/2020 15:19:20 [place], Ellamae [name] ([id number]) -------------------------------------------------------------------------------- Pain Assessment Details Patient Name: [name]. Date of Service: 02/04/2020 3:00 PM Medical Record Number: [id number] Patient Account Number: [id number] Date of Birth/Sex: 1955-11-20 (64 y.o. F) Treating RN: [name] Primary Care Danil Wedge: [name] Other Clinician: Referring Adriahna Shearman: [name] Treating Devlynn Knoff/Extender: STONE III, [name]: 9 Active Problems Location of Pain Severity and Description of Pain Patient Has Paino No Site Locations Pain Management and Medication Current Pain Management: Electronic Signature(s) Signed: 02/04/2020 4:58:49 PM By: [name] Entered By: [name] on 02/04/2020 14:59:58 [place], Ellamae [name] ([id number]) -------------------------------------------------------------------------------- Patient/Caregiver Education Details Patient Name: [name]. Date of Service: 02/04/2020 3:00 PM Medical Record Number: [id number] Patient Account Number: [id number] Date of Birth/Gender: 06/24/1956 (64 y.o. F) Treating RN: [name] Primary Care Physician: [name] Other Clinician: Referring Physician: Tandy [name] Treating Physician/Extender: Sharalyn Ink in [name]: 9 Education Assessment Education Provided To: Patient Education Topics Provided Wound/Skin Impairment: Handouts: Caring for Your Ulcer Methods: Demonstration, Explain/Verbal Responses: State content correctly Electronic Signature(s) Signed: 02/04/2020 4:58:49 PM By: [name] Entered By: [name] on 02/04/2020 15:30:02 [place], Ellamae [name] ([id number]) -------------------------------------------------------------------------------- Vitals Details Patient Name: [name]. Date of Service: 02/04/2020 3:00 PM Medical Record Number: [id number] Patient Account Number: [id number] Date of Birth/Sex: [birth year] (64 y.o. F) Treating RN: [name] Primary Care Barabara Motz: [name] Other Clinician: Referring Dareon Nunziato: [name] Treating Oleda Borski/Extender: STONE III, [name]: 9 Vital Signs Time Taken: 14:57 Temperature (F): 98.2 Pulse (bpm): 85 Respiratory Rate (breaths/min): 20 Blood Pressure (mmHg): 167/91 Reference Range: 80 - 120 mg / dl Electronic Signature(s) Signed: 02/04/2020 4:58:49 PM By: [name] Entered By: [name] on 02/04/2020 14:59:50

## 2020-02-12 ENCOUNTER — Ambulatory Visit: Payer: Medicaid Other | Admitting: Physician Assistant

## 2020-02-19 ENCOUNTER — Other Ambulatory Visit: Payer: Self-pay

## 2020-02-19 ENCOUNTER — Encounter: Payer: Medicaid Other | Attending: Internal Medicine | Admitting: Physician Assistant

## 2020-02-19 DIAGNOSIS — L97821 Non-pressure chronic ulcer of other part of left lower leg limited to breakdown of skin: Secondary | ICD-10-CM | POA: Insufficient documentation

## 2020-02-19 DIAGNOSIS — I10 Essential (primary) hypertension: Secondary | ICD-10-CM | POA: Diagnosis not present

## 2020-02-19 DIAGNOSIS — Q820 Hereditary lymphedema: Secondary | ICD-10-CM | POA: Diagnosis not present

## 2020-02-19 DIAGNOSIS — L97811 Non-pressure chronic ulcer of other part of right lower leg limited to breakdown of skin: Secondary | ICD-10-CM | POA: Diagnosis not present

## 2020-02-19 DIAGNOSIS — G4733 Obstructive sleep apnea (adult) (pediatric): Secondary | ICD-10-CM | POA: Diagnosis not present

## 2020-02-19 NOTE — Progress Notes (Addendum)
Elizabeth, [name] ([id number]) Visit Report for 02/19/2020 Chief Complaint Document Details Patient Name: [name], [name]. Date of Service: 02/19/2020 10:45 AM Medical Record Number: [id number] Patient Account Number: [id number] Date of Birth/Sex: 10/15/1955 (64 y.o. F) Treating RN: Cornell Barman Primary Care Provider: Tandy Gaw Other Clinician: Referring Provider: Tandy Gaw Treating Provider/Extender: Melburn Hake, Zakariya Knickerbocker Weeks in Treatment: 12 Information Obtained from: Patient Chief Complaint Bilateral LE lymphedema Electronic Signature(s) Signed: 02/19/2020 10:56:32 AM By: Worthy Keeler PA-[name] Entered By: Worthy Keeler on 02/19/2020 10:56:31 [name], [name] ([id number]) -------------------------------------------------------------------------------- HPI Details Patient Name: [name] Date of Service: 02/19/2020 10:45 AM Medical Record Number: [id number] Patient Account Number: [id number] Date of Birth/Sex: 03/07/1956 (64 y.o. F) Treating RN: Cornell Barman Primary Care Provider: Tandy Gaw Other Clinician: Referring Provider: Tandy Gaw Treating Provider/Extender: STONE III, Deondra Labrador Weeks in Treatment: 12 History of Present Illness HPI Description: The patient is a 64 year old female with history of hypertension and a long-standing history of bilateral lower extremity lymphedema (first presented on 4/2) . She has had open ulcers in the past which have always responded to compression therapy. She had briefly been to a lymphedema clinic in the past which helped her at the time. this time around she stopped treatment of her lymphedema pumps approximately 2 weeks ago because of some pain in the knees and then noticed the right leg getting worse. She was seen by her PCP who put her on clindamycin 4 times a day 2 days ago. The patient has seen [facility] and Dr. Delana Meyer had seen her last year where a vascular study including venous and arterial duplex studies were within normal  limits. he had recommended compression stockings and lymphedema pumps and the patient has been using this in about 2 weeks ago. She is known to be diabetic but in the past few time she's gone to her primary care doctor her hemoglobin A1c has been normal. 02/11/2015 - after her last visit she took my advice and went to the ER regarding the progressive cellulitis of her right lower extremity and she was admitted between July 17 and 22nd. She received IV antibiotics and then was sent home on a course of steroid-induced and oral antibiotics. She has improved much since then. 02/17/2015 -- she has been doing fine and the weeping of her legs has remarkably gone down. She has no fresh issues. READMISSION 01/15/18 This patient was given this clinic before most recently in 2016 seen by Dr. Con Memos. She has massive bilateral lymphedema and over the last 2 months this had weeping edema out of the left leg. She has compression pumps but her compliance with these has been minimal. She has [facility] they've been using TCA/ABDs/kerlix under an Ace wrap.she has had recent problems with cellulitis. She was apparently seen in the ER and 12/23/17 and given clindamycin. She was then followed by her primary doctor and given doxycycline and Keflex. The pain seems to have settled down. In April 2018 the patient had arterial studies done at [facility]. This showed triphasic waveforms throughout the right leg and mostly triphasic waveforms on the left except for monophasic at the posterior tibial artery distally. She was not felt to have evidence of right lower extremity arterial stenosis or significant problems on the left side. 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 She also had a right lower extremity venous Doppler in January 2018 this was limited by the patient's body habitus and lymphedema. Most of the  proximal veins were not visualized The patient presents with an  area of denuded skin on the anterior medial part of the left calf. There is weeping edema fluid here. 01/22/18; the patient has somewhat better edema control using her compression pumps twice a day and as a result she has much better epithelialization on the left anterior calf area. Only a small open area remains. 01/29/18; the patient has been compliant with her compression pumps. Both the areas on her calf that healed. The remaining area on the left anterior leg is fully epithelialized Readmission: 02/20/2019 upon evaluation today patient presents for reevaluation due to issues that she is having with the bilateral lower extremities. She actually has wounds open on both legs. On the right she has an area in the crease of her leg on the right around the knee region which is actually draining quite a bit and actually has some fungal type appearance to it. She has been on nystatin powder that seems to have helped to some degree. In regard to the left lower extremity this is actually in the lower portion of her leg closer to the ankle and again is continuing to drain as well unfortunately. There does not appear to be any signs of active infection at this time which is good news. No fevers, chills, nausea, vomiting, or diarrhea. She tells me that since she was seen last year she is actually been doing quite well for the most part with regard to her lower extremities. Unfortunately she now is experiencing a little bit more drainage at this time. She is concerned about getting this under control so that it does not get significantly worse. 02/27/2019 on evaluation today patient appears to be doing somewhat better in regard to her bilateral lower extremity wounds. She has been tolerating the dressing changes without complication. Fortunately there is no signs of active infection at this point. No fevers, chills, nausea, vomiting, or diarrhea. She did get her dressing supplies which is excellent news she was  extremely excited to get these. She also got paperwork from [employer] for their financial assistance program where they may be able to help her out in the future if needed with supplies at discounted prices. 03/06/2019 on evaluation today patient appears to be doing a little worse with regard to both areas of weeping on her bilateral lower extremities. This is around the right medial knee and just above the left ankle. With that being said she is unfortunately not doing as well as I would like to see. I feel like she may need to potentially go see someone at the lymphedema clinic as the wraps that she needs or even beyond what we can do here at the wound care center. She really does not have wounds she just has open areas of weeping that are causing some difficulty for her. Subsequently because of this and the moisture I am concerned about the potential for infection I am going to likely give her a prophylactic antibiotic today, Keflex, just to be on the safe side. Nonetheless again there is no obvious signs of active infection at this time. 03/13/2019 on evaluation today patient appears to be doing well with regard to her bilateral lower extremities where she has been weeping compared to even last week's evaluation. I see some areas of new skin growth which is excellent and overall I am very pleased with how things seem to be progressing. No fevers, chills, nausea, vomiting, or diarrhea. [name], [name] ([id number]) 03/20/2019 on evaluation  today patient unfortunately is continuing to have issues with significant edema of the left lower extremity. Her right side seems to be doing much better. Unfortunately her left side is showing increased weeping of the lower portion of her leg. This is quite unfortunate obviously we were hoping to get her into the lymphedema clinic they really do not seem to when I see her how if she is draining. Despite the fact this is really not wound related but more lymphedema  weeping related. Nonetheless I do not know that this can be helpful for her to even go for that appointment since again I am not sure there is much that they would actually do at this point. We may need to try a 4 layer compression wrap as best we can on her leg. She is on the Augmentin currently although I am still concerned about whether or not there could be potentially something going on infection wise I would obtain a culture though I understand is not the best being that is a surface culture I just 1 to make sure I do not seem to be missing anything. 03/27/2019 on evaluation today patient appears to be doing much better in regard to the left lower extremity compared to last week. Last week she had tremendous weeping which I think was subsequent to infection now she seems to be doing much better and very pleased. This is not completely healed but there is a lot of new skin growth and it has dried out quite a bit. Overall I think that we are doing well with how things are moving along at this time. No fevers, chills, nausea, vomiting, or diarrhea. 04/03/2019 evaluation today patient appears to be doing a little worse this week compared to last time I saw her. I think this may be due to the fact that she is having issues with not being able to sleep in her bed at least not until last night. She is therefore been in a lift chair and subsequently has also had issues with not been able to use her pumps since she could not get in bed. With that being said the patient overall seems to be doing okay I do think I may want extend the antibiotic for a little bit longer at least until we can see if her edema and her weeping gets better and if it is then obviously I can always discontinue the antibiotics as of next week however I want her to continue to have it over the next week. 04/10/2019 on evaluation today patient unfortunately is still doing poorly with regard to her left lower extremity. Her right is all  things considering doing fairly well. On the left however she continues to have spreading of the area of infection and weeping which appears to be even a larger surface area than noted last week. She did have a positive culture for Pseudomonas in particular which seems to have been of concern she still has green/yellow discharge consistent with Pseudomonas and subsequently a tremendous amount of it. This has me obviously still concerned about the infection not really clearing up despite the fact that on culture it appears the Cipro should have been a good option for treating this. I think she may at this point need IV antibiotics since things are not doing better I do not want to get worse and cause sepsis. She is in agreement with the plan and believes as well that she likely does need to go to the hospital for  IV vancomycin. Or something of the like depending on what the recommendation is from the ER. 04/17/2019 on evaluation today patient appears to be doing excellent in regard to her lower extremity on the left. She was in the hospital for several days from when I sent her last we saw her until just this past Tuesday. Fortunately her drainage is significantly improved and in fact is mostly clear. There is just a couple small areas that may still drain a little bit she states that the [facility] Dt they prescribed for her at discharge she went picked up from pharmacy and got home but has not been able to find it since. She is looked everywhere. She is wondering if I will replace that for her today I will be more than happy to do that. 05/01/2019 on evaluation today patient actually appears to be doing quite well with regard to her lower extremities. She occasionally is having areas that will leak and then heal up mainly when a piece of the fibrotic skin pops off but fortunately she is not having any signs of active infection at this time. Overall she also really does not have any obvious weeping at this  time. I do believe however she really needs some compression wraps and I think this may be a good time to get her back to the lymphedema clinic. 05/11/2019 on evaluation today patient actually appears to be doing quite well with regard to her bilateral lower extremities. She occasionally will have a small area that we per another but in general seems to be completely healed which is great news. Overall very pleased with how everything seems to be progressing. She does have her appointment with lymphedema clinic on November 18. 05/25/2019 on evaluation today patient appears to be doing well with regard to her left lower extremity. I am very pleased in this regard. In regard to her right leg this actually did start draining more I think it is mainly due to the fact that her leg is more swollen. I am not seeing any obvious signs of infection at this time although that is definitely something were obviously acutely aware of simply due to the fact that she had an issue not too far back with exactly this issue. Nonetheless I do feel like that lymphedema clinic would still be beneficial for her. I explained obviously if they are not able to do anything treatment wise on the right leg we could at least have them treat her left leg and then proceed from there. The patient is really in agreement with that plan. If they are able to do both as the drainage slows down that I would be happy to let them handle both. 06/01/2019 on evaluation today patient unfortunately appears to be doing worse with regard to her right lower extremity. The left lower extremity is still maintaining at this point. Unfortunately she has been having significantly increased pain over the past several days and has been experiencing as well increased swelling of the right lower extremity. I really do not know that I am seeing anything that appears to be obvious for infection at this point to be peripherally honest. With that being said the  patient does seem to be having much more swelling that she is even experienced in the past and coupled with increased pain in her hip as well I am concerned that again she could potentially have a DVT although I am not 100% sure of this. I think it something that may need to  be checked out. We discussed the possibility of sending her for a DVT study through the hospital but unfortunately transportation is an issue if she does have a DVT I do not want her to wait days to be able to get in for that test however if she has this scheduled as an outpatient that is as fast that she will be able to get the test scheduled for transportation purposes. That will also fall on Thanksgiving so subsequently she did actually be looking at either Friday or even next week before we would know anything back from this. That is much too long in my opinion. Subsequent to the amount of discomfort she is experiencing the patient is actually okay with going to the ER for evaluation today. 06/12/2019 on evaluation today patient actually appears to be doing significantly better compared to last time I saw her. Following when I last saw her she was actually in the hospital from that Monday until the following Sunday almost 1 full week. She actually was placed on Keflex in the hospital following the time for her to be discharged and Dr. Steva Ready has recommended 2 times a day dosing of the Keflex for the next year in order to help with more prophylactic/preventative measures with regard to her developing cellulitis. Overall I think this sounds like an excellent plan. The patient unfortunately is good to have trouble being treated at lymphedema clinic due to the fact that she really cannot get up on the bed that they have there. They also state that they cannot manage her as long as she has anything draining at this point. Obviously that is somewhat unfortunate as she does need help with edema control but nonetheless we will have  to do what we can for her outside of it sounds like the lymphedema clinic scenario at this point. 06/19/2019 on evaluation today patient appears to be doing fairly well with regard to her bilateral lower extremities. She is not nearly as swollen and shows no signs of infection at this point. There is no evidence of cellulitis whatsoever. She also has no open wounds or draining at this point which is also good news. No fever chills noted. She seems to be in very good spirits and in fact appears to be doing quite well. READMISSION 11/27/2019 [name], [name] ([id number]) This is a 64 year old woman that we have had in this clinic several times before including 2015, 16 and 19 and then most recently from 03/20/2019 through 06/19/2019 with bilateral lower extremity lymphedema. She has had previous arterial and reflux studies done years ago which were not all that remarkable. 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 She does have a positive family history and she had large legs starting may be in her 22s. She was recently in hospital from 10/20/2019 through 10/28/2019 with right leg cellulitis. She was given Ancef and clindamycin and then Zosyn when a culture showed Pseudomonas. At that time there was purulent drainage. She was followed by infectious disease Dr Steva Ready. The patient is now back at home. She has noted increased swelling in the right and no drainage in her right leg mostly on the posterior medial aspect in the calf area. She has not had pain or fever. She has literally been improved lysing above dressings because her at the area of this is far too large for standard compression. She has been wrapping the areas with sheets to resorptive pads. She is found these helped  somewhat. She does have an appointment with the lymphedema clinic in [place] in late June. Past medical history includes bilateral lymphedema, hypertension,  obstructive sleep apnea with CPAP. Recent hospitalization with apparently Pseudomonas cellulitis of the right lower leg 12/15/2019 upon evaluation today patient appears to be doing a little bit worse in regard to her right lower extremity. Unfortunately she is having more weeping down in the lower portion of her leg. Fortunately there is no signs of active infection at this time. No fever chills noted. The patient states she is not having increased pain except for when she attempted to use the lymphedema pumps unfortunately she states that she did have pain when she did this. Otherwise we been using absorptive dressings of one type or another she is using diapers at home and then subsequently Ace wraps. In regard to the barrier cream we have discussed the possibility of derma cloud which she would like to try I do not have a problem with that. 12/22/2019 upon evaluation today patient actually appears to be doing better in regard to her leg ulcers at this point. Fortunately there does not appear to be any signs of active infection which is great news and I am extremely pleased with where things are progressing at this time. There is no sign of active infection currently. The patient is very pleased to see things doing so well. 12/29/2019 upon evaluation today patient appears to be doing a little bit better in regard to her weeping in general over her lower extremities. She does have some signs of mild erythema little bit more than what I noted last week or rather last visit. Nonetheless I think that my threshold for switching her antibiotics from Keflex to something else is very low at this point considering that she has had such severe infections in the past that seem to come almost out of nowhere. There is a little erythema and warmth noted of the lower portion of her leg compared to the upper which also makes me want to go ahead and address things more rapidly at this point. Likely I would switch out  the Keflex for something like Levaquin ideally. 7/16; patient with severe bilateral lymphedema. She has superficial wounds albeit almost circumferential now on the left lateral lower leg. This may be new from last time. Small area on the right anterior lower leg and then another area on the right medial lower leg and of pannus fold. She has been using various absorptive garments. She states she is using her compression pumps once a day occasionally twice. Culture from her last visit here was negative 01/29/2020 on evaluation today patient appears to be doing excellent at this point in regard to her legs with regard to infection I see no signs of active infection at this point. She still does have unfortunately areas of weeping this is minimal on the right now her left is actually significantly worse although I do not think it is as bad as last week with Dr. Dellia Nims saw her. She has been trying to pump and elevate her legs is much as possible. She has previously been on the Keflex and in the past for prevention that seems to do fairly well and likely can extend that today. 02/04/2020 on evaluation today patient appears to be doing better in regard to her legs bilaterally. Fortunately there is no signs of active infection at this time which is great news and overall she has less weeping on the left compared to the  right and there is several spots where she is pretty much sealed up with no draining regions. Overall very pleased in this regard. 02/19/2020 on evaluation today patient appears to be doing very well in regard to her wounds currently. Fortunately there is no evidence of active infection overall very pleased with where things stand. She is significantly improved in regard to her edema I am extremely pleased in this regard she tells me that the popping no longer hurts and in fact she actually looks forward to it. Electronic Signature(s) Signed: 02/19/2020 1:26:50 PM By: Worthy Keeler  PA-[name] Entered By: Worthy Keeler on 02/19/2020 13:26:50 Haese, [name] ([id number]) -------------------------------------------------------------------------------- Physical Exam Details Patient Name: [name], [name]. Date of Service: 02/19/2020 10:45 AM Medical Record Number: [id number] Patient Account Number: [id number] Date of Birth/Sex: May 27, 1956 (64 y.o. F) Treating RN: Cornell Barman Primary Care Provider: Tandy Gaw Other Clinician: Referring Provider: Tandy Gaw Treating Provider/Extender: STONE III, Nil Bolser Weeks in Treatment: 12 Constitutional Well-nourished and well-hydrated in no acute distress. Respiratory normal breathing without difficulty. Psychiatric this patient is able to make decisions and demonstrates good insight into disease process. Alert and Oriented x 3. pleasant and cooperative. Notes Upon inspection patient's wound bed actually showed signs of good granulation at this time and epithelization overall. I feel like that her left leg is much less weepy than it used to be and overall I feel like she is making excellent progress and improvement since we last saw her with regard to her edema which is greatly improved. Electronic Signature(s) Signed: 02/19/2020 1:27:34 PM By: Worthy Keeler PA-[name] Entered By: Worthy Keeler on 02/19/2020 13:27:34 [name], [name] ([id number]) -------------------------------------------------------------------------------- Physician Orders Details Patient Name: [name] Date of Service: 02/19/2020 10:45 AM Medical Record Number: [id number] Patient Account Number: [id number] Date of Birth/Sex: 1956/06/10 (64 y.o. 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 - and as needed Follow-up Appointments o Return Appointment in 2 weeks. Edema Control o Elevate legs to the level of the heart and pump ankles as often as possible o Compression Pump: Use compression pump on left lower extremity for 60 minutes, twice daily. o Compression Pump: Use compression pump on right lower extremity for 60 minutes, twice daily. o Other: - ACE wraps Electronic Signature(s) Signed: 02/19/2020 5:00:44 PM By: Worthy Keeler PA-[name] Signed: 02/19/2020 6:14:49 PM By: Gretta Cool, BSN, RN, CWS, Kim RN, BSN Entered By: Gretta Cool, BSN, RN, CWS, Kim on 02/19/2020 11:43:49 [name], [name] ([id number]) -------------------------------------------------------------------------------- Problem List Details Patient Name: [name], RECENDIZ. Date of Service: 02/19/2020 10:45 AM Medical Record Number: [id number] Patient Account Number: [id number] Date of Birth/Sex: 10-Feb-1956 (64 y.o. 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 Date of Service: 02/19/2020 10:45 AM Medical Record Number: [id number] Patient Account Number: [id number] Date of Birth/Sex: 1956-01-29 (64 y.o. F) Treating RN: Cornell Barman Primary Care Provider: Tandy Gaw Other Clinician: Referring Provider: Tandy Gaw Treating Provider/Extender: Melburn Hake, Brehanna Deveny Weeks in Treatment: 12 Subjective Chief Complaint Information obtained from Patient Bilateral LE lymphedema History of Present Illness (HPI) The patient is a 64 year old female with history of hypertension and a long-standing history of bilateral lower extremity lymphedema (first presented on 4/2) . She has had open ulcers in the past which have always responded to compression therapy. She had briefly been to a lymphedema clinic in the past which helped her at the time. this time around she stopped treatment of her lymphedema pumps approximately 2 weeks ago because of some pain in the knees and then noticed the right leg getting worse. She was seen by her PCP who put her on clindamycin 4 times a day 2 days ago. The patient has seen [facility] and Dr. Delana Meyer had seen her last year where a vascular study including venous and arterial duplex studies were within normal limits. he had recommended compression stockings and lymphedema pumps and the patient has been using this in about 2 weeks ago. She is known to be diabetic but in the past few time she's gone to her primary care doctor her hemoglobin A1c has been normal. 02/11/2015 - after her last visit she took my advice and went to the ER regarding the progressive cellulitis of her right lower extremity and she was admitted between July 17 and 22nd. She received IV antibiotics and then was sent home on a course of steroid-induced and oral antibiotics. She has improved much since  then. 02/17/2015 -- she has been doing fine and the weeping of her legs has remarkably gone down. She has no fresh issues. READMISSION 01/15/18 This patient was given this clinic before most recently in 2016 seen by Dr. Con Memos. She has massive bilateral lymphedema and over the last 2 months this had weeping edema out of the left leg. She has compression pumps but her compliance with these has been minimal. She has [facility] they've been using TCA/ABDs/kerlix under an Ace wrap.she has had recent problems with cellulitis. She was apparently seen in the ER and 12/23/17 and given clindamycin. She was then followed by her primary doctor and given doxycycline and Keflex. The pain seems to have settled down. In April 2018 the patient had arterial studies done at [facility]. This showed triphasic waveforms throughout the right leg and mostly triphasic waveforms on the left except for monophasic at the posterior tibial artery distally. She was not felt to have evidence of right lower extremity arterial stenosis or significant problems on the left side. 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 She also had a right lower extremity venous Doppler in January 2018 this was limited by the patient's body habitus and lymphedema. Most of the proximal veins were not visualized The patient presents with an area of denuded skin on the anterior medial part of the left calf. There is weeping edema fluid here. 01/22/18; the patient has somewhat better edema control using  her compression pumps twice a day and as a result she has much better epithelialization on the left anterior calf area. Only a small open area remains. 01/29/18; the patient has been compliant with her compression pumps. Both the areas on her calf that healed. The remaining area on the left anterior leg is fully epithelialized Readmission: 02/20/2019 upon evaluation today patient presents for reevaluation  due to issues that she is having with the bilateral lower extremities. She actually has wounds open on both legs. On the right she has an area in the crease of her leg on the right around the knee region which is actually draining quite a bit and actually has some fungal type appearance to it. She has been on nystatin powder that seems to have helped to some degree. In regard to the left lower extremity this is actually in the lower portion of her leg closer to the ankle and again is continuing to drain as well unfortunately. There does not appear to be any signs of active infection at this time which is good news. No fevers, chills, nausea, vomiting, or diarrhea. She tells me that since she was seen last year she is actually been doing quite well for the most part with regard to her lower extremities. Unfortunately she now is experiencing a little bit more drainage at this time. She is concerned about getting this under control so that it does not get significantly worse. 02/27/2019 on evaluation today patient appears to be doing somewhat better in regard to her bilateral lower extremity wounds. She has been tolerating the dressing changes without complication. Fortunately there is no signs of active infection at this point. No fevers, chills, nausea, vomiting, or diarrhea. She did get her dressing supplies which is excellent news she was extremely excited to get these. She also got paperwork from [employer] for their financial assistance program where they may be able to help her out in the future if needed with supplies at discounted prices. 03/06/2019 on evaluation today patient appears to be doing a little worse with regard to both areas of weeping on her bilateral lower extremities. This is around the right medial knee and just above the left ankle. With that being said she is unfortunately not doing as well as I would like to see. I feel like she may need to potentially go see someone at the  lymphedema clinic as the wraps that she needs or even beyond what we can do here at the wound care center. She really does not have wounds she just has open areas of weeping that are causing some difficulty for her. Subsequently because of this and the moisture I am concerned about the potential for infection I am going to likely give her a prophylactic antibiotic today, Keflex, just to be on the safe side. Nonetheless again there is no obvious signs of active infection at this time. [name], [name] ([id number]) 03/13/2019 on evaluation today patient appears to be doing well with regard to her bilateral lower extremities where she has been weeping compared to even last week's evaluation. I see some areas of new skin growth which is excellent and overall I am very pleased with how things seem to be progressing. No fevers, chills, nausea, vomiting, or diarrhea. 03/20/2019 on evaluation today patient unfortunately is continuing to have issues with significant edema of the left lower extremity. Her right side seems to be doing much better. Unfortunately her left side is showing increased weeping of the lower portion of  her leg. This is quite unfortunate obviously we were hoping to get her into the lymphedema clinic they really do not seem to when I see her how if she is draining. Despite the fact this is really not wound related but more lymphedema weeping related. Nonetheless I do not know that this can be helpful for her to even go for that appointment since again I am not sure there is much that they would actually do at this point. We may need to try a 4 layer compression wrap as best we can on her leg. She is on the Augmentin currently although I am still concerned about whether or not there could be potentially something going on infection wise I would obtain a culture though I understand is not the best being that is a surface culture I just 1 to make sure I do not seem to be missing  anything. 03/27/2019 on evaluation today patient appears to be doing much better in regard to the left lower extremity compared to last week. Last week she had tremendous weeping which I think was subsequent to infection now she seems to be doing much better and very pleased. This is not completely healed but there is a lot of new skin growth and it has dried out quite a bit. Overall I think that we are doing well with how things are moving along at this time. No fevers, chills, nausea, vomiting, or diarrhea. 04/03/2019 evaluation today patient appears to be doing a little worse this week compared to last time I saw her. I think this may be due to the fact that she is having issues with not being able to sleep in her bed at least not until last night. She is therefore been in a lift chair and subsequently has also had issues with not been able to use her pumps since she could not get in bed. With that being said the patient overall seems to be doing okay I do think I may want extend the antibiotic for a little bit longer at least until we can see if her edema and her weeping gets better and if it is then obviously I can always discontinue the antibiotics as of next week however I want her to continue to have it over the next week. 04/10/2019 on evaluation today patient unfortunately is still doing poorly with regard to her left lower extremity. Her right is all things considering doing fairly well. On the left however she continues to have spreading of the area of infection and weeping which appears to be even a larger surface area than noted last week. She did have a positive culture for Pseudomonas in particular which seems to have been of concern she still has green/yellow discharge consistent with Pseudomonas and subsequently a tremendous amount of it. This has me obviously still concerned about the infection not really clearing up despite the fact that on culture it appears the Cipro should have  been a good option for treating this. I think she may at this point need IV antibiotics since things are not doing better I do not want to get worse and cause sepsis. She is in agreement with the plan and believes as well that she likely does need to go to the hospital for IV vancomycin. Or something of the like depending on what the recommendation is from the ER. 04/17/2019 on evaluation today patient appears to be doing excellent in regard to her lower extremity on the left. She was in  the hospital for several days from when I sent her last we saw her until just this past Tuesday. Fortunately her drainage is significantly improved and in fact is mostly clear. There is just a couple small areas that may still drain a little bit she states that the [facility] they prescribed for her at discharge she went picked up from pharmacy and got home but has not been able to find it since. She is looked everywhere. She is wondering if I will replace that for her today I will be more than happy to do that. 05/01/2019 on evaluation today patient actually appears to be doing quite well with regard to her lower extremities. She occasionally is having areas that will leak and then heal up mainly when a piece of the fibrotic skin pops off but fortunately she is not having any signs of active infection at this time. Overall she also really does not have any obvious weeping at this time. I do believe however she really needs some compression wraps and I think this may be a good time to get her back to the lymphedema clinic. 05/11/2019 on evaluation today patient actually appears to be doing quite well with regard to her bilateral lower extremities. She occasionally will have a small area that we per another but in general seems to be completely healed which is great news. Overall very pleased with how everything seems to be progressing. She does have her appointment with lymphedema clinic on November 18. 05/25/2019 on  evaluation today patient appears to be doing well with regard to her left lower extremity. I am very pleased in this regard. In regard to her right leg this actually did start draining more I think it is mainly due to the fact that her leg is more swollen. I am not seeing any obvious signs of infection at this time although that is definitely something were obviously acutely aware of simply due to the fact that she had an issue not too far back with exactly this issue. Nonetheless I do feel like that lymphedema clinic would still be beneficial for her. I explained obviously if they are not able to do anything treatment wise on the right leg we could at least have them treat her left leg and then proceed from there. The patient is really in agreement with that plan. If they are able to do both as the drainage slows down that I would be happy to let them handle both. 06/01/2019 on evaluation today patient unfortunately appears to be doing worse with regard to her right lower extremity. The left lower extremity is still maintaining at this point. Unfortunately she has been having significantly increased pain over the past several days and has been experiencing as well increased swelling of the right lower extremity. I really do not know that I am seeing anything that appears to be obvious for infection at this point to be peripherally honest. With that being said the patient does seem to be having much more swelling that she is even experienced in the past and coupled with increased pain in her hip as well I am concerned that again she could potentially have a DVT although I am not 100% sure of this. I think it something that may need to be checked out. We discussed the possibility of sending her for a DVT study through the hospital but unfortunately transportation is an issue if she does have a DVT I do not want her to wait days to be  able to get in for that test however if she has this scheduled as an  outpatient that is as fast that she will be able to get the test scheduled for transportation purposes. That will also fall on Thanksgiving so subsequently she did actually be looking at either Friday or even next week before we would know anything back from this. That is much too long in my opinion. Subsequent to the amount of discomfort she is experiencing the patient is actually okay with going to the ER for evaluation today. 06/12/2019 on evaluation today patient actually appears to be doing significantly better compared to last time I saw her. Following when I last saw her she was actually in the hospital from that Monday until the following Sunday almost 1 full week. She actually was placed on Keflex in the hospital following the time for her to be discharged and Dr. Steva Ready has recommended 2 times a day dosing of the Keflex for the next year in order to help with more prophylactic/preventative measures with regard to her developing cellulitis. Overall I think this sounds like an excellent plan. The patient unfortunately is good to have trouble being treated at lymphedema clinic due to the fact that she really cannot get up on the bed that they have there. They also state that they cannot manage her as long as she has anything draining at this point. Obviously that is somewhat unfortunate as she does need help with edema control but nonetheless we will have to do what we can for her outside of it sounds like the lymphedema clinic scenario at this point. 06/19/2019 on evaluation today patient appears to be doing fairly well with regard to her bilateral lower extremities. She is not nearly as swollen [name], [name]. ([id number]) and shows no signs of infection at this point. There is no evidence of cellulitis whatsoever. She also has no open wounds or draining at this point which is also good news. No fever chills noted. She seems to be in very good spirits and in fact appears to be doing quite  well. READMISSION 11/27/2019 This is a 64 year old woman that we have had in this clinic several times before including 2015, 16 and 19 and then most recently from 03/20/2019 through 06/19/2019 with bilateral lower extremity lymphedema. She has had previous arterial and reflux studies done years ago which were not all that remarkable. 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 She does have a positive family history and she had large legs starting may be in her 33s. She was recently in hospital from 10/20/2019 through 10/28/2019 with right leg cellulitis. She was given Ancef and clindamycin and then Zosyn when a culture showed Pseudomonas. At that time there was purulent drainage. She was followed by infectious disease Dr Steva Ready. The patient is now back at home. She has noted increased swelling in the right and no drainage in her right leg mostly on the posterior medial aspect in the calf area. She has not had pain or fever. She has literally been improved lysing above dressings because her at the area of this is far too large for standard compression. She has been wrapping the areas with sheets to resorptive pads. She is found these helped somewhat. She does have an appointment with the lymphedema clinic in [place] in late June. Past medical history includes bilateral lymphedema, hypertension, obstructive sleep apnea with CPAP. Recent hospitalization with apparently Pseudomonas cellulitis of the right lower  leg 12/15/2019 upon evaluation today patient appears to be doing a little bit worse in regard to her right lower extremity. Unfortunately she is having more weeping down in the lower portion of her leg. Fortunately there is no signs of active infection at this time. No fever chills noted. The patient states she is not having increased pain except for when she attempted to use the lymphedema pumps unfortunately she states that she did  have pain when she did this. Otherwise we been using absorptive dressings of one type or another she is using diapers at home and then subsequently Ace wraps. In regard to the barrier cream we have discussed the possibility of derma cloud which she would like to try I do not have a problem with that. 12/22/2019 upon evaluation today patient actually appears to be doing better in regard to her leg ulcers at this point. Fortunately there does not appear to be any signs of active infection which is great news and I am extremely pleased with where things are progressing at this time. There is no sign of active infection currently. The patient is very pleased to see things doing so well. 12/29/2019 upon evaluation today patient appears to be doing a little bit better in regard to her weeping in general over her lower extremities. She does have some signs of mild erythema little bit more than what I noted last week or rather last visit. Nonetheless I think that my threshold for switching her antibiotics from Keflex to something else is very low at this point considering that she has had such severe infections in the past that seem to come almost out of nowhere. There is a little erythema and warmth noted of the lower portion of her leg compared to the upper which also makes me want to go ahead and address things more rapidly at this point. Likely I would switch out the Keflex for something like Levaquin ideally. 7/16; patient with severe bilateral lymphedema. She has superficial wounds albeit almost circumferential now on the left lateral lower leg. This may be new from last time. Small area on the right anterior lower leg and then another area on the right medial lower leg and of pannus fold. She has been using various absorptive garments. She states she is using her compression pumps once a day occasionally twice. Culture from her last visit here was negative 01/29/2020 on evaluation today patient appears  to be doing excellent at this point in regard to her legs with regard to infection I see no signs of active infection at this point. She still does have unfortunately areas of weeping this is minimal on the right now her left is actually significantly worse although I do not think it is as bad as last week with Dr. Dellia Nims saw her. She has been trying to pump and elevate her legs is much as possible. She has previously been on the Keflex and in the past for prevention that seems to do fairly well and likely can extend that today. 02/04/2020 on evaluation today patient appears to be doing better in regard to her legs bilaterally. Fortunately there is no signs of active infection at this time which is great news and overall she has less weeping on the left compared to the right and there is several spots where she is pretty much sealed up with no draining regions. Overall very pleased in this regard. 02/19/2020 on evaluation today patient appears to be doing very well in regard to her  wounds currently. Fortunately there is no evidence of active infection overall very pleased with where things stand. She is significantly improved in regard to her edema I am extremely pleased in this regard she tells me that the popping no longer hurts and in fact she actually looks forward to it. Objective Constitutional Well-nourished and well-hydrated in no acute distress. Vitals Time Taken: 10:45 AM, Temperature: 98.4 F, Pulse: 66 bpm, Respiratory Rate: 20 breaths/min, Blood Pressure: 134/89 mmHg. Respiratory normal breathing without difficulty. Psychiatric this patient is able to make decisions and demonstrates good insight into disease process. Alert and Oriented x 3. pleasant and cooperative. LILIE, VEZINA ([id number]) General Notes: Upon inspection patient's wound bed actually showed signs of good granulation at this time and epithelization overall. I feel like that her left leg is much less weepy than it  used to be and overall I feel like she is making excellent progress and improvement since we last saw her with regard to her edema which is greatly improved. Other Condition(s) Patient presents with Lymphedema located on the Right Leg. Patient presents with Lymphedema located on the Left Leg. 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 - and as needed Follow-up Appointments: Return Appointment in 2 weeks. Edema Control: Elevate legs to the level of the heart and pump ankles as often as possible Compression Pump: Use compression pump on left lower extremity for 60 minutes, twice daily. Compression Pump: Use compression pump on right lower extremity for 60 minutes, twice daily. Other: - ACE wraps 1 I would recommend currently that we go ahead and continue with the wound care measures as before specifically with regard to just absorptive pads used over her legs in order to help catch any drainage. 2. I am also can recommend that the patient continue with appropriate elevation and use of her lymphedema pumps which I think is also helping her as far as getting the wounds to close up and heal. 3. I am also can recommend the patient continue to sleep in the bed at night she does have a hospital bed with stairs to get up that seems to be doing very well. We will see patient back for reevaluation in 2 weeks here in the clinic. If anything worsens or changes patient will contact our office for additional recommendations. Electronic Signature(s) Signed: 02/19/2020 1:28:18 PM By: Worthy Keeler PA-[name] Entered By: Worthy Keeler on 02/19/2020 13:28:18 Winders, [name]  ([id number]) -------------------------------------------------------------------------------- SuperBill Details Patient Name: [name] Date of Service: 02/19/2020 Medical Record Number: [id number] Patient Account Number: [id number] Date of Birth/Sex: [birth year] (64 y.o. 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 Facility Procedures CPT4 Code: 15176160 Description: 99213 - WOUND CARE VISIT-LEV 3 EST PT Modifier: Quantity: 1 Physician Procedures CPT4 Code: 7371062 Description: 69485 - WC PHYS LEVEL 3 - EST PT Modifier: Quantity: 1 CPT4 Code: Description: ICD-10 Diagnosis Description Q82.0  Hereditary lymphedema L97.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

## 2020-02-22 NOTE — Progress Notes (Signed)
[name], [name] ([id number]) Visit Report for 02/19/2020 Arrival Information Details Patient Name: [name], [name]. Date of Service: 02/19/2020 10:45 AM Medical Record Number: [id number] Patient Account Number: [id number] Date of Birth/Sex: Sep 29, 1955 (64 y.o. F) Treating [name]: [name] Primary Care [name]: [name] Other Clinician: Referring [name]: [name] Treating [name]/Extender: [name], [name]: 12 Visit Information History Since Last Visit All ordered tests and consults were completed: No Patient Arrived: Ambulatory Added or deleted any medications: No Arrival Time: 10:54 Any new allergies or adverse reactions: No Accompanied By: self Had a fall or experienced change in No Transfer Assistance: None activities of daily living that may affect Patient Identification Verified: Yes risk of falls: Secondary Verification Process Completed: Yes Signs or symptoms of abuse/neglect since last visito No Patient Requires Transmission-Based Precautions: No Hospitalized since last visit: No Patient Has Alerts: No Implantable device outside of the clinic excluding No cellular tissue based products placed in the center since last visit: Pain Present Now: No Electronic Signature(s) Signed: 02/22/2020 11:48:21 AM By: [name] Entered By: [name] on 02/19/2020 10:54:50 [name], [name] ([id number]) -------------------------------------------------------------------------------- Clinic Level of Care Assessment Details Patient Name: [name]. Date of Service: 02/19/2020 10:45 AM Medical Record Number: [id number] Patient Account Number: [id number] Date of Birth/Sex: Nov 02, 1955 (64 y.o. F) Treating [name]: [name] Primary Care Junnie Loschiavo: [name] Other Clinician: Referring [name]: [name] Treating [name]/Extender: [name], [name]: 12 Clinic Level of Care Assessment Items TOOL 4 Quantity Score []  -  Use when only an EandM is performed on FOLLOW-UP visit 0 ASSESSMENTS - Nursing Assessment / Reassessment X - Reassessment of Co-morbidities (includes updates in patient status) 1 10 X- 1 5 Reassessment of Adherence to [name] Plan ASSESSMENTS - Wound and Skin Assessment / Reassessment []  - Simple Wound Assessment / Reassessment - one wound 0 X- 2 5 Complex Wound Assessment / Reassessment - multiple wounds []  - 0 Dermatologic / Skin Assessment (not related to wound area) ASSESSMENTS - Focused Assessment X - Circumferential Edema Measurements - multi extremities 1 5 []  - 0 Nutritional Assessment / Counseling / Intervention []  - 0 Lower Extremity Assessment (monofilament, tuning fork, pulses) []  - 0 Peripheral Arterial Disease Assessment (using hand held doppler) ASSESSMENTS - Ostomy and/or Continence Assessment and Care []  - Incontinence Assessment and Management 0 []  - 0 Ostomy Care Assessment and Management (repouching, etc.) PROCESS - Coordination of Care []  - Simple Patient / Family Education for ongoing care 0 X- 1 20 Complex (extensive) Patient / Family Education for ongoing care []  - 0 Staff obtains Programmer, systems, Records, Test Results / Process Orders []  - 0 Staff telephones HHA, Nursing Homes / Clarify orders / etc []  - 0 Routine Transfer to another Facility (non-emergent condition) []  - 0 Routine Hospital Admission (non-emergent condition) []  - 0 New Admissions / Biomedical engineer / Ordering NPWT, Apligraf, etc. []  - 0 Emergency Hospital Admission (emergent condition) []  - 0 Simple Discharge Coordination []  - 0 Complex (extensive) Discharge Coordination PROCESS - Special Needs []  - Pediatric / Minor Patient Management 0 []  - 0 Isolation Patient Management []  - 0 Hearing / Language / Visual special needs []  - 0 Assessment of Community assistance (transportation, D/C planning, etc.) []  - 0 Additional assistance / Altered mentation []  - 0 Support  Surface(s) Assessment (bed, cushion, seat, etc.) INTERVENTIONS - Wound Cleansing / Measurement Meixner, Shereen C. ([id number]) []  - 0 Simple Wound Cleansing - one wound X- 1 5 Complex Wound Cleansing -  multiple wounds X- 1 5 Wound Imaging (photographs - any number of wounds) []  - 0 Wound Tracing (instead of photographs) []  - 0 Simple Wound Measurement - one wound X- 1 5 Complex Wound Measurement - multiple wounds INTERVENTIONS - Wound Dressings []  - Small Wound Dressing one or multiple wounds 0 []  - 0 Medium Wound Dressing one or multiple wounds X- 2 20 Large Wound Dressing one or multiple wounds []  - 0 Application of Medications - topical []  - 0 Application of Medications - injection INTERVENTIONS - Miscellaneous []  - External ear exam 0 []  - 0 Specimen Collection (cultures, biopsies, blood, body fluids, etc.) []  - 0 Specimen(s) / Culture(s) sent or taken to Lab for analysis []  - 0 Patient Transfer (multiple staff / Civil Service fast streamer / Similar devices) []  - 0 Simple Staple / Suture removal (25 or less) []  - 0 Complex Staple / Suture removal (26 or more) []  - 0 Hypo / Hyperglycemic Management (close monitor of Blood Glucose) []  - 0 Ankle / Brachial Index (ABI) - do not check if billed separately X- 1 5 Vital Signs Has the patient been seen at the hospital within the last three years: Yes Total Score: 110 Level Of Care: New/Established - Level 3 Electronic Signature(s) Signed: 02/19/2020 6:14:49 PM By: [name], BSN, [name], CWS, [name], BSN Entered By: [name], BSN, [name], CWS, Elizabeth on 02/19/2020 11:45:58 [name], [name] ([id number]) -------------------------------------------------------------------------------- Encounter Discharge Information Details Patient Name: Elizabeth Landau C. Date of Service: 02/19/2020 10:45 AM Medical Record Number: [id number] Patient Account Number: [id number] Date of Birth/Sex: 02/02/1956 (64 y.o. F) Treating [name]: [name] Primary Care Yancy Hascall: [name] Other Clinician: Referring Melton Walls: [name] Treating Chidera Thivierge/Extender: [name], [name]: 12 Encounter Discharge Information Items Discharge Condition: Stable Ambulatory Status: Cane Discharge Destination: Home Transportation: Private Auto Accompanied By: self Schedule Follow-up Appointment: Yes Clinical Summary of Care: Electronic Signature(s) Signed: 02/19/2020 6:14:49 PM By: [name], BSN, [name], CWS, [name], BSN Entered By: [name], BSN, [name], CWS, Elizabeth on 02/19/2020 11:47:21 [name], [name] ([id number]) -------------------------------------------------------------------------------- Lower Extremity Assessment Details Patient Name: ESRAA, SERES. Date of Service: 02/19/2020 10:45 AM Medical Record Number: [id number] Patient Account Number: [id number] Date of Birth/Sex: [birth year] (64 y.o. F) Treating [name]: [name] Primary Care Alexandro Line: [name] Other Clinician: Referring Benjamyn Hestand: [name] Treating Shaquella Stamant/Extender: STONE III, [name]: 12 Edema Assessment Assessed: [Left: Yes] [Right: Yes] Edema: [Left: Yes] [Right: Yes] Calf Left: Right: Point of Measurement: 34 cm From Medial Instep 72 cm 78 cm Ankle Left: Right: Point of Measurement: 13 cm From Medial Instep 58 cm 60 cm Vascular Assessment Pulses: Dorsalis Pedis Palpable: [Left:Yes] [Right:Yes] Electronic Signature(s) Signed: 02/19/2020 6:14:49 PM By: [name], BSN, [name], CWS, [name], BSN Signed: 02/22/2020 11:48:21 AM By: [name] Entered By: [name] on 02/19/2020 11:18:41 [name], [name] ([id number]) -------------------------------------------------------------------------------- Multi Wound Chart Details Patient Name: NAYLANI, BRADNER C. Date of Service: 02/19/2020 10:45 AM Medical Record Number: [id number] Patient Account Number: [id number] Date of Birth/Sex: 28-Aug-1955 (64 y.o. F) Treating [name]: [name] Primary Care Meng Winterton: [name] Other  Clinician: Referring Jaci Desanto: [name] Treating Aspin Palomarez/Extender: [name], [name]: 12 Vital Signs Height(in): Pulse(bpm): 66 Weight(lbs): Blood Pressure(mmHg): 134/89 Body Mass Index(BMI): Temperature(F): 98.4 Respiratory Rate(breaths/min): 20 Wound Assessments [name] Notes Electronic Signature(s) Signed: 02/19/2020 6:14:49 PM By: [name], BSN, [name], CWS, [name], BSN Entered By: [name], BSN, [name], CWS, Elizabeth on 02/19/2020 11:42:48 [name] ([id number]) -------------------------------------------------------------------------------- Multi-Disciplinary Care Plan Details Patient  Name: [name], [name]. Date of Service: 02/19/2020 10:45 AM Medical Record Number: [id number] Patient Account Number: [id number] Date of Birth/Sex: Dec 03, 1955 (64 y.o. F) Treating [name]: [name] Primary Care Daeja Helderman: [name] Other Clinician: Referring Deem Marmol: [name] Treating Klea Nall/Extender: [name], [name]: 12 Active Inactive Abuse / Safety / Falls / Self Care Management Nursing Diagnoses: Potential for falls Goals: Patient will remain injury free related to falls Date Initiated: 11/27/2019 Target Resolution Date: 02/13/2020 Goal Status: Active Interventions: Assess fall risk on admission and as needed Notes: Orientation to the Wound Care Program Nursing Diagnoses: Knowledge deficit related to the wound healing center program Goals: Patient/caregiver will verbalize understanding of the [facility] Date Initiated: 11/27/2019 Target Resolution Date: 02/13/2020 Goal Status: Active Interventions: Provide education on orientation to the wound center Notes: Venous Leg Ulcer Nursing Diagnoses: Potential for venous Insuffiency (use before diagnosis confirmed) Goals: Patient will maintain optimal edema control Date Initiated: 11/27/2019 Target Resolution Date: 02/13/2020 Goal Status: Active Interventions: Compression as  ordered Notes: Electronic Signature(s) Signed: 02/19/2020 6:14:49 PM By: [name], BSN, [name], CWS, [name], BSN Entered By: [name], BSN, [name], CWS, Elizabeth on 02/19/2020 11:42:36 [name], [name] ([id number]) -------------------------------------------------------------------------------- Non-Wound Condition Assessment Details Patient Name: [name]. Date of Service: 02/19/2020 10:45 AM Medical Record Number: [id number] Patient Account Number: [id number] Date of Birth/Gender: 1955-11-02 (64 y.o. F) Treating [name]: [name] Primary Care Physician: [name] Other Clinician: Referring Physician: Tandy [name] Treating Physician/Extender: [name], [name]: 12 Non-Wound Condition: Condition: Lymphedema Location: Leg Side: Right Photos Electronic Signature(s) Signed: 02/19/2020 6:14:49 PM By: [name], BSN, [name], CWS, [name], BSN Signed: 02/22/2020 11:48:21 AM By: [name] Entered By: [name] on 02/19/2020 11:19:37 [name], [name] ([id number]) -------------------------------------------------------------------------------- Non-Wound Condition Assessment Details Patient Name: [name], [name] Date of Service: 02/19/2020 10:45 AM Medical Record Number: [id number] Patient Account Number: [id number] Date of Birth/Gender: 07-12-1955 (63 y.o. F) Treating [name]: [name] Primary Care Physician: [name] Other Clinician: Referring Physician: Tandy [name] Treating Physician/Extender: [name], [name]: 12 Non-Wound Condition: Condition: Lymphedema Location: Leg Side: Left Photos Electronic Signature(s) Signed: 02/19/2020 6:14:49 PM By: [name], BSN, [name], CWS, [name], BSN Signed: 02/22/2020 11:48:21 AM By: [name] Entered By: [name] on 02/19/2020 11:19:37 [name], [name] ([id number]) -------------------------------------------------------------------------------- Pain Assessment Details Patient Name: EMERIE, VANDERKOLK. Date  of Service: 02/19/2020 10:45 AM Medical Record Number: [id number] Patient Account Number: [id number] Date of Birth/Sex: October 13, 1955 (64 y.o. F) Treating [name]: [name] Primary Care Shalae Belmonte: [name] Other Clinician: Referring Bresha Hosack: [name] Treating Hans Rusher/Extender: STONE III, [name]: 12 Active Problems Location of Pain Severity and Description of Pain Patient Has Paino No Site Locations With Dressing Change: No Pain Management and Medication Current Pain Management: Electronic Signature(s) Signed: 02/19/2020 6:14:49 PM By: [name], BSN, [name], CWS, [name], BSN Signed: 02/22/2020 11:48:21 AM By: [name] Entered By: [name] on 02/19/2020 10:56:16 Bedoy, [name] ([id number]) -------------------------------------------------------------------------------- Patient/Caregiver Education Details Patient Name: [name]. Date of Service: 02/19/2020 10:45 AM Medical Record Number: [id number] Patient Account Number: [id number] Date of Birth/Gender: 1956-06-14 (64 y.o. F) Treating [name]: [name] Primary Care Physician: [name] Other Clinician: Referring Physician: Tandy [name] Treating Physician/Extender: Sharalyn Ink in [name]: 12 Education Assessment Education Provided To: Patient Education Topics Provided Venous: Wound/Skin Impairment: Handouts: Caring for Your Ulcer Methods: Demonstration, Explain/Verbal Responses: State content correctly Electronic Signature(s) Signed: 02/19/2020 6:14:49 PM By: [name], BSN, [name],  CWS, [name], BSN Entered By: [name], BSN, [name], CWS, Elizabeth on 02/19/2020 11:46:35 Adger, [name] ([id number]) -------------------------------------------------------------------------------- Vitals Details Patient Name: [name] Date of Service: 02/19/2020 10:45 AM Medical Record Number: [id number] Patient Account Number: [id number] Date of Birth/Sex: [birth year] (64 y.o. F) Treating [name]: [name] Primary Care Paisyn Guercio: [name] Other Clinician: Referring Rowdy Guerrini: [name] Treating Dee Paden/Extender: STONE III, [name]: 12 Vital Signs Time Taken: 10:45 Temperature (F): 98.4 Pulse (bpm): 66 Respiratory Rate (breaths/min): 20 Blood Pressure (mmHg): 134/89 Reference Range: 80 - 120 mg / dl Electronic Signature(s) Signed: 02/22/2020 11:48:21 AM By: [name] Entered By: [name] on 02/19/2020 10:56:08

## 2020-03-04 ENCOUNTER — Other Ambulatory Visit: Payer: Self-pay

## 2020-03-04 ENCOUNTER — Encounter: Payer: Medicaid Other | Admitting: Physician Assistant

## 2020-03-04 DIAGNOSIS — L97811 Non-pressure chronic ulcer of other part of right lower leg limited to breakdown of skin: Secondary | ICD-10-CM | POA: Diagnosis not present

## 2020-03-04 NOTE — Progress Notes (Addendum)
[name], [name] ([id number]) Visit Report for 03/04/2020 Arrival Information Details Patient Name: [name], [name]. Date of Service: 03/04/2020 9:00 AM Medical Record Number: [id number] Patient Account Number: [id number] Date of Birth/Sex: 05-18-1956 (64 y.o. F) Treating RN: [name] Primary Care [name]: [name] Other Clinician: Referring [name]: [name] Treating [name]/Extender: [name], [name] in Treatment: 14 Visit Information History Since Last Visit All ordered tests and consults were completed: No Patient Arrived: Ambulatory Added or deleted any medications: No Arrival Time: 09:17 Any new allergies or adverse reactions: No Accompanied By: self Had a fall or experienced change in No Transfer Assistance: None activities of daily living that may affect Patient Identification Verified: Yes risk of falls: Secondary Verification Process Completed: Yes Signs or symptoms of abuse/neglect since last visito No Patient Requires Transmission-Based Precautions: No Hospitalized since last visit: No Patient Has Alerts: No Implantable device outside of the clinic excluding No cellular tissue based products placed in the center since last visit: Has Dressing in Place as Prescribed: Yes Has Compression in Place as Prescribed: Yes Pain Present Now: No Electronic Signature(s) Signed: 03/04/2020 2:29:58 PM By: [name] Entered By: [name] on 03/04/2020 09:18:50 [name], [name] ([id number]) -------------------------------------------------------------------------------- Clinic Level of Care Assessment Details Patient Name: [name]. Date of Service: 03/04/2020 9:00 AM Medical Record Number: [id number] Patient Account Number: [id number] Date of Birth/Sex: 09-Apr-1956 (64 y.o. F) Treating RN: [name] Primary Care [name]: [name] Other Clinician: Referring [name]: [name] Treating [name]/Extender: [name],  [name] in Treatment: 14 Clinic Level of Care Assessment Items TOOL 4 Quantity Score []  - Use when only an EandM is performed on FOLLOW-UP visit 0 ASSESSMENTS - Nursing Assessment / Reassessment X - Reassessment of Co-morbidities (includes updates in patient status) 1 10 X- 1 5 Reassessment of Adherence to Treatment Plan ASSESSMENTS - Wound and Skin Assessment / Reassessment X - Simple Wound Assessment / Reassessment - one wound 1 5 []  - 0 Complex Wound Assessment / Reassessment - multiple wounds []  - 0 Dermatologic / Skin Assessment (not related to wound area) ASSESSMENTS - Focused Assessment X - Circumferential Edema Measurements - multi extremities 2 5 []  - 0 Nutritional Assessment / Counseling / Intervention X- 1 5 Lower Extremity Assessment (monofilament, tuning fork, pulses) []  - 0 Peripheral Arterial Disease Assessment (using hand held doppler) ASSESSMENTS - Ostomy and/or Continence Assessment and Care []  - Incontinence Assessment and Management 0 []  - 0 Ostomy Care Assessment and Management (repouching, etc.) PROCESS - Coordination of Care X - Simple Patient / Family Education for ongoing care 1 15 []  - 0 Complex (extensive) Patient / Family Education for ongoing care []  - 0 Staff obtains Programmer, systems, Records, Test Results / Process Orders []  - 0 Staff telephones HHA, Nursing Homes / Clarify orders / etc []  - 0 Routine Transfer to another Facility (non-emergent condition) []  - 0 Routine Hospital Admission (non-emergent condition) []  - 0 New Admissions / Biomedical engineer / Ordering NPWT, Apligraf, etc. []  - 0 Emergency Hospital Admission (emergent condition) []  - 0 Simple Discharge Coordination []  - 0 Complex (extensive) Discharge Coordination PROCESS - Special Needs []  - Pediatric / Minor Patient Management 0 []  - 0 Isolation Patient Management []  - 0 Hearing / Language / Visual special needs []  - 0 Assessment of Community assistance  (transportation, D/C planning, etc.) []  - 0 Additional assistance / Altered mentation []  - 0 Support Surface(s) Assessment (bed, cushion, seat, etc.) INTERVENTIONS - Wound Cleansing / Measurement Barhorst, Ary C. (  [id number]) []  - 0 Simple Wound Cleansing - one wound X- 2 5 Complex Wound Cleansing - multiple wounds X- 1 5 Wound Imaging (photographs - any number of wounds) []  - 0 Wound Tracing (instead of photographs) X- 1 5 Simple Wound Measurement - one wound []  - 0 Complex Wound Measurement - multiple wounds INTERVENTIONS - Wound Dressings []  - Small Wound Dressing one or multiple wounds 0 []  - 0 Medium Wound Dressing one or multiple wounds X- 2 20 Large Wound Dressing one or multiple wounds []  - 0 Application of Medications - topical []  - 0 Application of Medications - injection INTERVENTIONS - Miscellaneous []  - External ear exam 0 []  - 0 Specimen Collection (cultures, biopsies, blood, body fluids, etc.) []  - 0 Specimen(s) / Culture(s) sent or taken to Lab for analysis []  - 0 Patient Transfer (multiple staff / Civil Service fast streamer / Similar devices) []  - 0 Simple Staple / Suture removal (25 or less) []  - 0 Complex Staple / Suture removal (26 or more) []  - 0 Hypo / Hyperglycemic Management (close monitor of Blood Glucose) []  - 0 Ankle / Brachial Index (ABI) - do not check if billed separately X- 1 5 Vital Signs Has the patient been seen at the hospital within the last three years: Yes Total Score: 115 Level Of Care: New/Established - Level 3 Electronic Signature(s) Signed: 03/04/2020 4:25:26 PM By: [name] Entered By: [name] on 03/04/2020 09:42:07 [name], [name] ([id number]) -------------------------------------------------------------------------------- Encounter Discharge Information Details Patient Name: [name]. Date of Service: 03/04/2020 9:00 AM Medical Record Number: [id number] Patient Account Number: [id number] Date of Birth/Sex:  07-20-1955 (64 y.o. F) Treating RN: [name] Primary Care Jaice Digioia: [name] Other Clinician: Referring Travaris Kosh: [name] Treating [name]/Extender: [name], [name] in Treatment: 14 Encounter Discharge Information Items Discharge Condition: Stable Ambulatory Status: Ambulatory Discharge Destination: Home Transportation: Private Auto Accompanied By: self Schedule Follow-up Appointment: Yes Clinical Summary of Care: Electronic Signature(s) Signed: 03/04/2020 4:25:26 PM By: [name] Entered By: [name] on 03/04/2020 09:44:23 [name], [name] ([id number]) -------------------------------------------------------------------------------- Lower Extremity Assessment Details Patient Name: [name]. Date of Service: 03/04/2020 9:00 AM Medical Record Number: [id number] Patient Account Number: [id number] Date of Birth/Sex: April 26, 1956 (64 y.o. F) Treating RN: [name] Primary Care Aldea Avis: [name] Other Clinician: Referring Sawyer Kahan: [name] Treating Westly Hinnant/Extender: STONE III, [name] in Treatment: 14 Edema Assessment Assessed: [Left: Yes] [Right: Yes] Edema: [Left: Yes] [Right: Yes] Calf Left: Right: Point of Measurement: 34 cm From Medial Instep 73 cm 77 cm Ankle Left: Right: Point of Measurement: 13 cm From Medial Instep 55 cm 59 cm Vascular Assessment Pulses: Dorsalis Pedis Palpable: [Left:Yes] [Right:Yes] Notes unable to feel posterior tibial due to lymphedema. Electronic Signature(s) Signed: 03/04/2020 2:29:58 PM By: [name] Signed: 03/04/2020 4:37:20 PM By: Gretta Cool, BSN, RN, CWS, Kim RN, BSN Entered By: [name] on 03/04/2020 09:21:42 [name], [name] ([id number]) -------------------------------------------------------------------------------- Multi Wound Chart Details Patient Name: [name], [name]. Date of Service: 03/04/2020 9:00 AM Medical Record Number: [id number] Patient Account Number:  [id number] Date of Birth/Sex: Jul 21, 1955 (64 y.o. F) Treating RN: [name] Primary Care Floyde Dingley: [name] Other Clinician: Referring Jakavion Bilodeau: [name] Treating Jenya Putz/Extender: STONE III, [name] in Treatment: 14 Vital Signs Height(in): 63 Pulse(bpm): 63 Weight(lbs): 370 Blood Pressure(mmHg): 155/94 Body Mass Index(BMI): 66 Temperature(F): 98.1 Respiratory Rate(breaths/min): 22 Wound Assessments Treatment Notes Electronic Signature(s) Signed: 03/04/2020 4:25:26 PM By: [name] Entered By: [name] on 03/04/2020 09:38:41 Meckes, [name] ([id number]) --------------------------------------------------------------------------------  Multi-Disciplinary Care Plan Details Patient Name: [name], [name]. Date of Service: 03/04/2020 9:00 AM Medical Record Number: [id number] Patient Account Number: [id number] Date of Birth/Sex: May 06, 1956 (64 y.o. F) Treating RN: [name] Primary Care Lajuan Godbee: [name] Other Clinician: Referring Manasvini Whatley: [name] Treating Leane Loring/Extender: [name], [name] in Treatment: 14 Active Inactive Abuse / Safety / Falls / Self Care Management Nursing Diagnoses: Potential for falls Goals: Patient will remain injury free related to falls Date Initiated: 11/27/2019 Target Resolution Date: 02/13/2020 Goal Status: Active Interventions: Assess fall risk on admission and as needed Notes: Orientation to the Wound Care Program Nursing Diagnoses: Knowledge deficit related to the wound healing center program Goals: Patient/caregiver will verbalize understanding of the [facility] Program Date Initiated: 11/27/2019 Target Resolution Date: 02/13/2020 Goal Status: Active Interventions: Provide education on orientation to the wound center Notes: Venous Leg Ulcer Nursing Diagnoses: Potential for venous Insuffiency (use before diagnosis confirmed) Goals: Patient will maintain optimal edema  control Date Initiated: 11/27/2019 Target Resolution Date: 02/13/2020 Goal Status: Active Interventions: Compression as ordered Notes: Electronic Signature(s) Signed: 03/04/2020 4:25:26 PM By: [name] Entered By: [name] on 03/04/2020 09:38:28 [name], [name] ([id number]) -------------------------------------------------------------------------------- Non-Wound Condition Assessment Details Patient Name: [name]. Date of Service: 03/04/2020 9:00 AM Medical Record Number: [id number] Patient Account Number: [id number] Date of Birth/Sex: 1955-09-30 (64 y.o. F) Treating RN: [name] Primary Care Kemonte Ullman: [name] Other Clinician: Referring Philamena Kramar: [name] Treating Ricke Kimoto/Extender: STONE III, [name] in Treatment: 14 Non-Wound Condition: Condition: Lymphedema Location: Leg Side: Right Notes: Right leg with decreased drainage this visit. Photos Electronic Signature(s) Signed: 03/04/2020 2:29:58 PM By: [name] Signed: 03/04/2020 4:37:20 PM By: Gretta Cool, BSN, RN, CWS, Kim RN, BSN Entered By: [name] on 03/04/2020 09:27:18 [name] ([id number]) -------------------------------------------------------------------------------- Non-Wound Condition Assessment Details Patient Name: [name], [name]. Date of Service: 03/04/2020 9:00 AM Medical Record Number: [id number] Patient Account Number: [id number] Date of Birth/Sex: [birth year] (64 y.o. F) Treating RN: [name] Primary Care Lyna Laningham: [name] Other Clinician: Referring Linnette Panella: [name] Treating Pattiann Solanki/Extender: STONE III, [name] in Treatment: 14 Non-Wound Condition: Condition: Lymphedema Location: Leg Side: Left Notes: decreased drainage this visit. Photos Electronic Signature(s) Signed: 03/04/2020 2:29:58 PM By: [name] Signed: 03/04/2020 4:37:20 PM By: Gretta Cool, BSN, RN, CWS, Kim RN, BSN Entered By: [name] on 03/04/2020  09:27:53 [name], [name] ([id number]) -------------------------------------------------------------------------------- Pain Assessment Details Patient Name: [name], [name]. Date of Service: 03/04/2020 9:00 AM Medical Record Number: [id number] Patient Account Number: [id number] Date of Birth/Sex: 1955-09-16 (64 y.o. F) Treating RN: [name] Primary Care Maely Clements: [name] Other Clinician: Referring Jania Steinke: [name] Treating Florence Yeung/Extender: STONE III, [name] in Treatment: 14 Active Problems Location of Pain Severity and Description of Pain Patient Has Paino No Site Locations With Dressing Change: No Pain Management and Medication Current Pain Management: Electronic Signature(s) Signed: 03/04/2020 2:29:58 PM By: [name] Signed: 03/04/2020 4:37:20 PM By: Gretta Cool, BSN, RN, CWS, Kim RN, BSN Entered By: [name] on 03/04/2020 09:19:58 [name] ([id number]) -------------------------------------------------------------------------------- Patient/Caregiver Education Details Patient Name: [name]. Date of Service: 03/04/2020 9:00 AM Medical Record Number: [id number] Patient Account Number: [id number] Date of Birth/Gender: [birth year] (64 y.o. F) Treating RN: [name] Primary Care Physician: [name] Other Clinician: Referring Physician: Tandy [name] Treating Physician/Extender: Sharalyn Ink in Treatment: 14 Education Assessment Education Provided To: Patient Education Topics Provided Wound/Skin Impairment: Handouts: Skin Care Do's and Dont's Spanish Methods: Explain/Verbal Responses: State content correctly  Electronic Signature(s) Signed: 03/04/2020 4:25:26 PM By: [name] Entered By: [name] on 03/04/2020 09:42:46 Rom, [name] ([id number]) -------------------------------------------------------------------------------- Vitals Details Patient Name: [name] Date of Service:  03/04/2020 9:00 AM Medical Record Number: [id number] Patient Account Number: [id number] Date of Birth/Sex: 10-16-1955 (64 y.o. F) Treating RN: [name] Primary Care Muadh Creasy: [name] Other Clinician: Referring Sweet Jarvis: [name] Treating Tali Cleaves/Extender: STONE III, [name] in Treatment: 14 Vital Signs Time Taken: 09:00 Temperature (F): 98.1 Height (in): 63 Pulse (bpm): 63 Source: Stated Respiratory Rate (breaths/min): 22 Weight (lbs): 370 Blood Pressure (mmHg): 155/94 Source: Stated Reference Range: 80 - 120 mg / dl Body Mass Index (BMI): 65.5 Electronic Signature(s) Signed: 03/04/2020 2:29:58 PM By: [name] Entered By: [name] on 03/04/2020 09:19:40

## 2020-03-04 NOTE — Progress Notes (Addendum)
IMAN, REINERTSEN ([id number]) Visit Report for 03/04/2020 Chief Complaint Document Details Patient Name: [name], [name]. Date of Service: 03/04/2020 9:00 AM Medical Record Number: [id number] Patient Account Number: [id number] Date of Birth/Sex: December 30, 1955 (64 y.o. F) Treating RN: Cornell Barman Primary Care Provider: Tandy Gaw Other Clinician: Referring Provider: Tandy Gaw Treating Provider/Extender: Melburn Hake, Andris Brothers Weeks in Treatment: 14 Information Obtained from: Patient Chief Complaint Bilateral LE lymphedema Electronic Signature(s) Signed: 03/04/2020 9:10:41 AM By: Worthy Keeler PA-C Entered By: Worthy Keeler on 03/04/2020 09:10:40 Rom, [name] ([id number]) -------------------------------------------------------------------------------- HPI Details Patient Name: [name]. Date of Service: 03/04/2020 9:00 AM Medical Record Number: [id number] Patient Account Number: [id number] Date of Birth/Sex: 1956-03-30 (64 y.o. F) Treating RN: Cornell Barman Primary Care Provider: Tandy Gaw Other Clinician: Referring Provider: Tandy Gaw Treating Provider/Extender: STONE III, Jeremih Dearmas Weeks in Treatment: 14 History of Present Illness HPI Description: The patient is a 64 year old female with history of hypertension and a long-standing history of bilateral lower extremity lymphedema (first presented on 4/2) . She has had open ulcers in the past which have always responded to compression therapy. She had briefly been to a lymphedema clinic in the past which helped her at the time. this time around she stopped treatment of her lymphedema pumps approximately 2 weeks ago because of some pain in the knees and then noticed the right leg getting worse. She was seen by her PCP who put her on clindamycin 4 times a day 2 days ago. The patient has seen [facility] and Dr. Delana Meyer had seen her last year where a vascular study including venous and arterial duplex studies were within normal  limits. he had recommended compression stockings and lymphedema pumps and the patient has been using this in about 2 weeks ago. She is known to be diabetic but in the past few time she's gone to her primary care doctor her hemoglobin A1c has been normal. 02/11/2015 - after her last visit she took my advice and went to the ER regarding the progressive cellulitis of her right lower extremity and she was admitted between July 17 and 22nd. She received IV antibiotics and then was sent home on a course of steroid-induced and oral antibiotics. She has improved much since then. 02/17/2015 -- she has been doing fine and the weeping of her legs has remarkably gone down. She has no fresh issues. READMISSION 01/15/18 This patient was given this clinic before most recently in 2016 seen by Dr. Con Memos. She has massive bilateral lymphedema and over the last 2 months this had weeping edema out of the left leg. She has compression pumps but her compliance with these has been minimal. She has [facility] they've been using TCA/ABDs/kerlix under an Ace wrap.she has had recent problems with cellulitis. She was apparently seen in the ER and 12/23/17 and given clindamycin. She was then followed by her primary doctor and given doxycycline and Keflex. The pain seems to have settled down. In April 2018 the patient had arterial studies done at [facility]. This showed triphasic waveforms throughout the right leg and mostly triphasic waveforms on the left except for monophasic at the posterior tibial artery distally. She was not felt to have evidence of right lower extremity arterial stenosis or significant problems on the left side. 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 She also had a right lower extremity venous Doppler in January 2018 this was limited by the patient's body habitus and lymphedema. Most of the  proximal veins were not visualized The patient presents with an  area of denuded skin on the anterior medial part of the left calf. There is weeping edema fluid here. 01/22/18; the patient has somewhat better edema control using her compression pumps twice a day and as a result she has much better epithelialization on the left anterior calf area. Only a small open area remains. 01/29/18; the patient has been compliant with her compression pumps. Both the areas on her calf that healed. The remaining area on the left anterior leg is fully epithelialized Readmission: 02/20/2019 upon evaluation today patient presents for reevaluation due to issues that she is having with the bilateral lower extremities. She actually has wounds open on both legs. On the right she has an area in the crease of her leg on the right around the knee region which is actually draining quite a bit and actually has some fungal type appearance to it. She has been on nystatin powder that seems to have helped to some degree. In regard to the left lower extremity this is actually in the lower portion of her leg closer to the ankle and again is continuing to drain as well unfortunately. There does not appear to be any signs of active infection at this time which is good news. No fevers, chills, nausea, vomiting, or diarrhea. She tells me that since she was seen last year she is actually been doing quite well for the most part with regard to her lower extremities. Unfortunately she now is experiencing a little bit more drainage at this time. She is concerned about getting this under control so that it does not get significantly worse. 02/27/2019 on evaluation today patient appears to be doing somewhat better in regard to her bilateral lower extremity wounds. She has been tolerating the dressing changes without complication. Fortunately there is no signs of active infection at this point. No fevers, chills, nausea, vomiting, or diarrhea. She did get her dressing supplies which is excellent news she was  extremely excited to get these. She also got paperwork from [employer] for their financial assistance program where they may be able to help her out in the future if needed with supplies at discounted prices. 03/06/2019 on evaluation today patient appears to be doing a little worse with regard to both areas of weeping on her bilateral lower extremities. This is around the right medial knee and just above the left ankle. With that being said she is unfortunately not doing as well as I would like to see. I feel like she may need to potentially go see someone at the lymphedema clinic as the wraps that she needs or even beyond what we can do here at the wound care center. She really does not have wounds she just has open areas of weeping that are causing some difficulty for her. Subsequently because of this and the moisture I am concerned about the potential for infection I am going to likely give her a prophylactic antibiotic today, Keflex, just to be on the safe side. Nonetheless again there is no obvious signs of active infection at this time. 03/13/2019 on evaluation today patient appears to be doing well with regard to her bilateral lower extremities where she has been weeping compared to even last week's evaluation. I see some areas of new skin growth which is excellent and overall I am very pleased with how things seem to be progressing. No fevers, chills, nausea, vomiting, or diarrhea. [name], [name] ([id number]) 03/20/2019 on evaluation  today patient unfortunately is continuing to have issues with significant edema of the left lower extremity. Her right side seems to be doing much better. Unfortunately her left side is showing increased weeping of the lower portion of her leg. This is quite unfortunate obviously we were hoping to get her into the lymphedema clinic they really do not seem to when I see her how if she is draining. Despite the fact this is really not wound related but more lymphedema  weeping related. Nonetheless I do not know that this can be helpful for her to even go for that appointment since again I am not sure there is much that they would actually do at this point. We may need to try a 4 layer compression wrap as best we can on her leg. She is on the Augmentin currently although I am still concerned about whether or not there could be potentially something going on infection wise I would obtain a culture though I understand is not the best being that is a surface culture I just 1 to make sure I do not seem to be missing anything. 03/27/2019 on evaluation today patient appears to be doing much better in regard to the left lower extremity compared to last week. Last week she had tremendous weeping which I think was subsequent to infection now she seems to be doing much better and very pleased. This is not completely healed but there is a lot of new skin growth and it has dried out quite a bit. Overall I think that we are doing well with how things are moving along at this time. No fevers, chills, nausea, vomiting, or diarrhea. 04/03/2019 evaluation today patient appears to be doing a little worse this week compared to last time I saw her. I think this may be due to the fact that she is having issues with not being able to sleep in her bed at least not until last night. She is therefore been in a lift chair and subsequently has also had issues with not been able to use her pumps since she could not get in bed. With that being said the patient overall seems to be doing okay I do think I may want extend the antibiotic for a little bit longer at least until we can see if her edema and her weeping gets better and if it is then obviously I can always discontinue the antibiotics as of next week however I want her to continue to have it over the next week. 04/10/2019 on evaluation today patient unfortunately is still doing poorly with regard to her left lower extremity. Her right is all  things considering doing fairly well. On the left however she continues to have spreading of the area of infection and weeping which appears to be even a larger surface area than noted last week. She did have a positive culture for Pseudomonas in particular which seems to have been of concern she still has green/yellow discharge consistent with Pseudomonas and subsequently a tremendous amount of it. This has me obviously still concerned about the infection not really clearing up despite the fact that on culture it appears the Cipro should have been a good option for treating this. I think she may at this point need IV antibiotics since things are not doing better I do not want to get worse and cause sepsis. She is in agreement with the plan and believes as well that she likely does need to go to the hospital for  IV vancomycin. Or something of the like depending on what the recommendation is from the ER. 04/17/2019 on evaluation today patient appears to be doing excellent in regard to her lower extremity on the left. She was in the hospital for several days from when I sent her last we saw her until just this past Tuesday. Fortunately her drainage is significantly improved and in fact is mostly clear. There is just a couple small areas that may still drain a little bit she states that the [facility] they prescribed for her at discharge she went picked up from pharmacy and got home but has not been able to find it since. She is looked everywhere. She is wondering if I will replace that for her today I will be more than happy to do that. 05/01/2019 on evaluation today patient actually appears to be doing quite well with regard to her lower extremities. She occasionally is having areas that will leak and then heal up mainly when a piece of the fibrotic skin pops off but fortunately she is not having any signs of active infection at this time. Overall she also really does not have any obvious weeping at this  time. I do believe however she really needs some compression wraps and I think this may be a good time to get her back to the lymphedema clinic. 05/11/2019 on evaluation today patient actually appears to be doing quite well with regard to her bilateral lower extremities. She occasionally will have a small area that we per another but in general seems to be completely healed which is great news. Overall very pleased with how everything seems to be progressing. She does have her appointment with lymphedema clinic on November 18. 05/25/2019 on evaluation today patient appears to be doing well with regard to her left lower extremity. I am very pleased in this regard. In regard to her right leg this actually did start draining more I think it is mainly due to the fact that her leg is more swollen. I am not seeing any obvious signs of infection at this time although that is definitely something were obviously acutely aware of simply due to the fact that she had an issue not too far back with exactly this issue. Nonetheless I do feel like that lymphedema clinic would still be beneficial for her. I explained obviously if they are not able to do anything treatment wise on the right leg we could at least have them treat her left leg and then proceed from there. The patient is really in agreement with that plan. If they are able to do both as the drainage slows down that I would be happy to let them handle both. 06/01/2019 on evaluation today patient unfortunately appears to be doing worse with regard to her right lower extremity. The left lower extremity is still maintaining at this point. Unfortunately she has been having significantly increased pain over the past several days and has been experiencing as well increased swelling of the right lower extremity. I really do not know that I am seeing anything that appears to be obvious for infection at this point to be peripherally honest. With that being said the  patient does seem to be having much more swelling that she is even experienced in the past and coupled with increased pain in her hip as well I am concerned that again she could potentially have a DVT although I am not 100% sure of this. I think it something that may need to  be checked out. We discussed the possibility of sending her for a DVT study through the hospital but unfortunately transportation is an issue if she does have a DVT I do not want her to wait days to be able to get in for that test however if she has this scheduled as an outpatient that is as fast that she will be able to get the test scheduled for transportation purposes. That will also fall on Thanksgiving so subsequently she did actually be looking at either Friday or even next week before we would know anything back from this. That is much too long in my opinion. Subsequent to the amount of discomfort she is experiencing the patient is actually okay with going to the ER for evaluation today. 06/12/2019 on evaluation today patient actually appears to be doing significantly better compared to last time I saw her. Following when I last saw her she was actually in the hospital from that Monday until the following Sunday almost 1 full week. She actually was placed on Keflex in the hospital following the time for her to be discharged and Dr. Steva Ready has recommended 2 times a day dosing of the Keflex for the next year in order to help with more prophylactic/preventative measures with regard to her developing cellulitis. Overall I think this sounds like an excellent plan. The patient unfortunately is good to have trouble being treated at lymphedema clinic due to the fact that she really cannot get up on the bed that they have there. They also state that they cannot manage her as long as she has anything draining at this point. Obviously that is somewhat unfortunate as she does need help with edema control but nonetheless we will have  to do what we can for her outside of it sounds like the lymphedema clinic scenario at this point. 06/19/2019 on evaluation today patient appears to be doing fairly well with regard to her bilateral lower extremities. She is not nearly as swollen and shows no signs of infection at this point. There is no evidence of cellulitis whatsoever. She also has no open wounds or draining at this point which is also good news. No fever chills noted. She seems to be in very good spirits and in fact appears to be doing quite well. READMISSION 11/27/2019 [name], [name] ([id number]) This is a 64 year old woman that we have had in this clinic several times before including 2015, 16 and 19 and then most recently from 03/20/2019 through 06/19/2019 with bilateral lower extremity lymphedema. She has had previous arterial and reflux studies done years ago which were not all that remarkable. 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 She does have a positive family history and she had large legs starting may be in her 75s. She was recently in hospital from 10/20/2019 through 10/28/2019 with right leg cellulitis. She was given Ancef and clindamycin and then Zosyn when a culture showed Pseudomonas. At that time there was purulent drainage. She was followed by infectious disease Dr Steva Ready. The patient is now back at home. She has noted increased swelling in the right and no drainage in her right leg mostly on the posterior medial aspect in the calf area. She has not had pain or fever. She has literally been improved lysing above dressings because her at the area of this is far too large for standard compression. She has been wrapping the areas with sheets to resorptive pads. She is found these helped  somewhat. She does have an appointment with the lymphedema clinic in [place] in late June. Past medical history includes bilateral lymphedema, hypertension,  obstructive sleep apnea with CPAP. Recent hospitalization with apparently Pseudomonas cellulitis of the right lower leg 12/15/2019 upon evaluation today patient appears to be doing a little bit worse in regard to her right lower extremity. Unfortunately she is having more weeping down in the lower portion of her leg. Fortunately there is no signs of active infection at this time. No fever chills noted. The patient states she is not having increased pain except for when she attempted to use the lymphedema pumps unfortunately she states that she did have pain when she did this. Otherwise we been using absorptive dressings of one type or another she is using diapers at home and then subsequently Ace wraps. In regard to the barrier cream we have discussed the possibility of derma cloud which she would like to try I do not have a problem with that. 12/22/2019 upon evaluation today patient actually appears to be doing better in regard to her leg ulcers at this point. Fortunately there does not appear to be any signs of active infection which is great news and I am extremely pleased with where things are progressing at this time. There is no sign of active infection currently. The patient is very pleased to see things doing so well. 12/29/2019 upon evaluation today patient appears to be doing a little bit better in regard to her weeping in general over her lower extremities. She does have some signs of mild erythema little bit more than what I noted last week or rather last visit. Nonetheless I think that my threshold for switching her antibiotics from Keflex to something else is very low at this point considering that she has had such severe infections in the past that seem to come almost out of nowhere. There is a little erythema and warmth noted of the lower portion of her leg compared to the upper which also makes me want to go ahead and address things more rapidly at this point. Likely I would switch out  the Keflex for something like Levaquin ideally. 7/16; patient with severe bilateral lymphedema. She has superficial wounds albeit almost circumferential now on the left lateral lower leg. This may be new from last time. Small area on the right anterior lower leg and then another area on the right medial lower leg and of pannus fold. She has been using various absorptive garments. She states she is using her compression pumps once a day occasionally twice. Culture from her last visit here was negative 01/29/2020 on evaluation today patient appears to be doing excellent at this point in regard to her legs with regard to infection I see no signs of active infection at this point. She still does have unfortunately areas of weeping this is minimal on the right now her left is actually significantly worse although I do not think it is as bad as last week with Dr. Dellia Nims saw her. She has been trying to pump and elevate her legs is much as possible. She has previously been on the Keflex and in the past for prevention that seems to do fairly well and likely can extend that today. 02/04/2020 on evaluation today patient appears to be doing better in regard to her legs bilaterally. Fortunately there is no signs of active infection at this time which is great news and overall she has less weeping on the left compared to the  right and there is several spots where she is pretty much sealed up with no draining regions. Overall very pleased in this regard. 02/19/2020 on evaluation today patient appears to be doing very well in regard to her wounds currently. Fortunately there is no evidence of active infection overall very pleased with where things stand. She is significantly improved in regard to her edema I am extremely pleased in this regard she tells me that the popping no longer hurts and in fact she actually looks forward to it. 03/04/2020 on evaluation today patient appears to be doing excellent in regard to her  lower extremities. Fortunately there is no signs of active infection at this time. No fevers, chills, nausea, vomiting, or diarrhea. Electronic Signature(s) Signed: 03/04/2020 10:08:25 AM By: Worthy Keeler PA-C Entered By: Worthy Keeler on 03/04/2020 10:08:24 [name] ([id number]) -------------------------------------------------------------------------------- Physical Exam Details Patient Name: REIGAN, TOLLIVER C. Date of Service: 03/04/2020 9:00 AM Medical Record Number: [id number] Patient Account Number: [id number] Date of Birth/Sex: 07-02-1956 (64 y.o. F) Treating RN: Cornell Barman Primary Care Provider: Tandy Gaw Other Clinician: Referring Provider: Tandy Gaw Treating Provider/Extender: STONE III, Zakariye Nee Weeks in Treatment: 14 Constitutional Obese and well-hydrated in no acute distress. Respiratory normal breathing without difficulty. Psychiatric this patient is able to make decisions and demonstrates good insight into disease process. Alert and Oriented x 3. pleasant and cooperative. Notes Overall patient appears to be doing excellent in regard to her lower extremities she has been pumping and keeping things under great control as far as the edema is concerned she is able to walk more easily and overall feels much better which is awesome news. Electronic Signature(s) Signed: 03/04/2020 10:09:00 AM By: Worthy Keeler PA-C Entered By: Worthy Keeler on 03/04/2020 10:09:00 [name] ([id number]) -------------------------------------------------------------------------------- Physician Orders Details Patient Name: [name] Date of Service: 03/04/2020 9:00 AM Medical Record Number: [id number] Patient Account Number: [id number] Date of Birth/Sex: March 08, 1956 (64 y.o. 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 - and as needed Follow-up Appointments o Return Appointment in 3 weeks. Edema Control o Elevate legs to the level of the heart and pump ankles as often as possible o Compression Pump: Use compression pump on left lower extremity for 60 minutes, twice daily. o Compression Pump: Use compression pump on right lower extremity for 60 minutes, twice daily. o Other: - ACE wraps Electronic Signature(s) Signed: 03/04/2020 4:25:26 PM By: Grover Canavan Signed: 03/04/2020 4:47:28 PM By: Worthy Keeler PA-C Entered By: Grover Canavan on 03/04/2020 09:40:20 [name], [name] ([id number]) -------------------------------------------------------------------------------- Problem List Details Patient Name: [name], [name]. Date of Service: 03/04/2020 9:00 AM Medical Record Number: [id number] Patient Account Number: [id number] Date of Birth/Sex: February 26, 1956 (64 y.o. 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 Date of Service: 03/04/2020 9:00 AM Medical Record Number: [id number] Patient Account Number: [id number] Date of Birth/Sex: 1955/10/08 (64 y.o. F) Treating RN: Cornell Barman Primary Care Provider: Tandy Gaw Other Clinician: Referring Provider: Tandy Gaw Treating Provider/Extender: Melburn Hake, Iliyana Convey Weeks in Treatment: 14 Subjective Chief Complaint Information obtained from Patient Bilateral LE lymphedema History of Present Illness (HPI) The patient is a 64 year old female with history of hypertension and a long-standing history of bilateral lower extremity lymphedema (first presented on 4/2) . She has had open ulcers in the past which have always responded to compression therapy. She had briefly been to a lymphedema clinic in the past which helped her at the time. this time around she stopped treatment of her lymphedema pumps approximately 2 weeks ago because of some pain in the knees and then noticed the right leg getting worse. She was seen by her PCP who put her on clindamycin 4 times a day 2 days ago. The patient has seen [facility] and Dr. Delana Meyer had seen her last year where a vascular study including venous and arterial duplex studies were within normal limits. he had recommended compression stockings and lymphedema pumps and the patient has been using this in about 2 weeks ago. She is known to be diabetic but in the past few time she's gone to her primary care doctor her hemoglobin A1c has been normal. 02/11/2015 - after her last visit she took my advice and went to the ER regarding the progressive cellulitis of her right lower extremity and she was admitted between July 17 and 22nd. She received IV  antibiotics and then was sent home on a course of steroid-induced and oral antibiotics. She has improved much since then. 02/17/2015 -- she has been doing fine and the weeping of her legs has remarkably gone down. She has no fresh issues. READMISSION 01/15/18 This patient was given this clinic before most recently in 2016 seen by Dr. Con Memos. She has massive bilateral lymphedema and over the last 2 months this had weeping edema out of the left leg. She has compression pumps but her compliance with these has been minimal. She has [facility] they've been using TCA/ABDs/kerlix under an Ace wrap.she has had recent problems with cellulitis. She was apparently seen in the ER and 12/23/17 and given clindamycin. She was then followed by her primary doctor and given doxycycline and Keflex. The pain seems to have settled down. In April 2018 the patient had arterial studies done at [facility]. This showed triphasic waveforms throughout the right leg and mostly triphasic waveforms on the left except for monophasic at the posterior tibial artery distally. She was not felt to have evidence of right lower extremity arterial stenosis or significant problems on the left side. 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 She also had a right lower extremity venous Doppler in January 2018 this was limited by the patient's body habitus and lymphedema. Most of the proximal veins were not visualized The patient presents with an area of denuded skin on the anterior medial part of the left calf. There is  weeping edema fluid here. 01/22/18; the patient has somewhat better edema control using her compression pumps twice a day and as a result she has much better epithelialization on the left anterior calf area. Only a small open area remains. 01/29/18; the patient has been compliant with her compression pumps. Both the areas on her calf that healed. The remaining area on the  left anterior leg is fully epithelialized Readmission: 02/20/2019 upon evaluation today patient presents for reevaluation due to issues that she is having with the bilateral lower extremities. She actually has wounds open on both legs. On the right she has an area in the crease of her leg on the right around the knee region which is actually draining quite a bit and actually has some fungal type appearance to it. She has been on nystatin powder that seems to have helped to some degree. In regard to the left lower extremity this is actually in the lower portion of her leg closer to the ankle and again is continuing to drain as well unfortunately. There does not appear to be any signs of active infection at this time which is good news. No fevers, chills, nausea, vomiting, or diarrhea. She tells me that since she was seen last year she is actually been doing quite well for the most part with regard to her lower extremities. Unfortunately she now is experiencing a little bit more drainage at this time. She is concerned about getting this under control so that it does not get significantly worse. 02/27/2019 on evaluation today patient appears to be doing somewhat better in regard to her bilateral lower extremity wounds. She has been tolerating the dressing changes without complication. Fortunately there is no signs of active infection at this point. No fevers, chills, nausea, vomiting, or diarrhea. She did get her dressing supplies which is excellent news she was extremely excited to get these. She also got paperwork from [employer] for their financial assistance program where they may be able to help her out in the future if needed with supplies at discounted prices. 03/06/2019 on evaluation today patient appears to be doing a little worse with regard to both areas of weeping on her bilateral lower extremities. This is around the right medial knee and just above the left ankle. With that being said she is  unfortunately not doing as well as I would like to see. I feel like she may need to potentially go see someone at the lymphedema clinic as the wraps that she needs or even beyond what we can do here at the wound care center. She really does not have wounds she just has open areas of weeping that are causing some difficulty for her. Subsequently because of this and the moisture I am concerned about the potential for infection I am going to likely give her a prophylactic antibiotic today, Keflex, just to be on the safe side. Nonetheless again there is no obvious signs of active infection at this time. JASIAH, BUNTIN ([id number]) 03/13/2019 on evaluation today patient appears to be doing well with regard to her bilateral lower extremities where she has been weeping compared to even last week's evaluation. I see some areas of new skin growth which is excellent and overall I am very pleased with how things seem to be progressing. No fevers, chills, nausea, vomiting, or diarrhea. 03/20/2019 on evaluation today patient unfortunately is continuing to have issues with significant edema of the left lower extremity. Her right side seems to be doing much better.  Unfortunately her left side is showing increased weeping of the lower portion of her leg. This is quite unfortunate obviously we were hoping to get her into the lymphedema clinic they really do not seem to when I see her how if she is draining. Despite the fact this is really not wound related but more lymphedema weeping related. Nonetheless I do not know that this can be helpful for her to even go for that appointment since again I am not sure there is much that they would actually do at this point. We may need to try a 4 layer compression wrap as best we can on her leg. She is on the Augmentin currently although I am still concerned about whether or not there could be potentially something going on infection wise I would obtain a culture though I  understand is not the best being that is a surface culture I just 1 to make sure I do not seem to be missing anything. 03/27/2019 on evaluation today patient appears to be doing much better in regard to the left lower extremity compared to last week. Last week she had tremendous weeping which I think was subsequent to infection now she seems to be doing much better and very pleased. This is not completely healed but there is a lot of new skin growth and it has dried out quite a bit. Overall I think that we are doing well with how things are moving along at this time. No fevers, chills, nausea, vomiting, or diarrhea. 04/03/2019 evaluation today patient appears to be doing a little worse this week compared to last time I saw her. I think this may be due to the fact that she is having issues with not being able to sleep in her bed at least not until last night. She is therefore been in a lift chair and subsequently has also had issues with not been able to use her pumps since she could not get in bed. With that being said the patient overall seems to be doing okay I do think I may want extend the antibiotic for a little bit longer at least until we can see if her edema and her weeping gets better and if it is then obviously I can always discontinue the antibiotics as of next week however I want her to continue to have it over the next week. 04/10/2019 on evaluation today patient unfortunately is still doing poorly with regard to her left lower extremity. Her right is all things considering doing fairly well. On the left however she continues to have spreading of the area of infection and weeping which appears to be even a larger surface area than noted last week. She did have a positive culture for Pseudomonas in particular which seems to have been of concern she still has green/yellow discharge consistent with Pseudomonas and subsequently a tremendous amount of it. This has me obviously still  concerned about the infection not really clearing up despite the fact that on culture it appears the Cipro should have been a good option for treating this. I think she may at this point need IV antibiotics since things are not doing better I do not want to get worse and cause sepsis. She is in agreement with the plan and believes as well that she likely does need to go to the hospital for IV vancomycin. Or something of the like depending on what the recommendation is from the ER. 04/17/2019 on evaluation today patient appears to be doing  excellent in regard to her lower extremity on the left. She was in the hospital for several days from when I sent her last we saw her until just this past Tuesday. Fortunately her drainage is significantly improved and in fact is mostly clear. There is just a couple small areas that may still drain a little bit she states that the [facility] they prescribed for her at discharge she went picked up from pharmacy and got home but has not been able to find it since. She is looked everywhere. She is wondering if I will replace that for her today I will be more than happy to do that. 05/01/2019 on evaluation today patient actually appears to be doing quite well with regard to her lower extremities. She occasionally is having areas that will leak and then heal up mainly when a piece of the fibrotic skin pops off but fortunately she is not having any signs of active infection at this time. Overall she also really does not have any obvious weeping at this time. I do believe however she really needs some compression wraps and I think this may be a good time to get her back to the lymphedema clinic. 05/11/2019 on evaluation today patient actually appears to be doing quite well with regard to her bilateral lower extremities. She occasionally will have a small area that we per another but in general seems to be completely healed which is great news. Overall very pleased with  how everything seems to be progressing. She does have her appointment with lymphedema clinic on November 18. 05/25/2019 on evaluation today patient appears to be doing well with regard to her left lower extremity. I am very pleased in this regard. In regard to her right leg this actually did start draining more I think it is mainly due to the fact that her leg is more swollen. I am not seeing any obvious signs of infection at this time although that is definitely something were obviously acutely aware of simply due to the fact that she had an issue not too far back with exactly this issue. Nonetheless I do feel like that lymphedema clinic would still be beneficial for her. I explained obviously if they are not able to do anything treatment wise on the right leg we could at least have them treat her left leg and then proceed from there. The patient is really in agreement with that plan. If they are able to do both as the drainage slows down that I would be happy to let them handle both. 06/01/2019 on evaluation today patient unfortunately appears to be doing worse with regard to her right lower extremity. The left lower extremity is still maintaining at this point. Unfortunately she has been having significantly increased pain over the past several days and has been experiencing as well increased swelling of the right lower extremity. I really do not know that I am seeing anything that appears to be obvious for infection at this point to be peripherally honest. With that being said the patient does seem to be having much more swelling that she is even experienced in the past and coupled with increased pain in her hip as well I am concerned that again she could potentially have a DVT although I am not 100% sure of this. I think it something that may need to be checked out. We discussed the possibility of sending her for a DVT study through the hospital but unfortunately transportation is an issue if she  does have a DVT I do not want her to wait days to be able to get in for that test however if she has this scheduled as an outpatient that is as fast that she will be able to get the test scheduled for transportation purposes. That will also fall on Thanksgiving so subsequently she did actually be looking at either Friday or even next week before we would know anything back from this. That is much too long in my opinion. Subsequent to the amount of discomfort she is experiencing the patient is actually okay with going to the ER for evaluation today. 06/12/2019 on evaluation today patient actually appears to be doing significantly better compared to last time I saw her. Following when I last saw her she was actually in the hospital from that Monday until the following Sunday almost 1 full week. She actually was placed on Keflex in the hospital following the time for her to be discharged and Dr. Steva Ready has recommended 2 times a day dosing of the Keflex for the next year in order to help with more prophylactic/preventative measures with regard to her developing cellulitis. Overall I think this sounds like an excellent plan. The patient unfortunately is good to have trouble being treated at lymphedema clinic due to the fact that she really cannot get up on the bed that they have there. They also state that they cannot manage her as long as she has anything draining at this point. Obviously that is somewhat unfortunate as she does need help with edema control but nonetheless we will have to do what we can for her outside of it sounds like the lymphedema clinic scenario at this point. 06/19/2019 on evaluation today patient appears to be doing fairly well with regard to her bilateral lower extremities. She is not nearly as swollen [name], [name] ([id number]) and shows no signs of infection at this point. There is no evidence of cellulitis whatsoever. She also has no open wounds or draining at this  point which is also good news. No fever chills noted. She seems to be in very good spirits and in fact appears to be doing quite well. READMISSION 11/27/2019 This is a 64 year old woman that we have had in this clinic several times before including 2015, 16 and 19 and then most recently from 03/20/2019 through 06/19/2019 with bilateral lower extremity lymphedema. She has had previous arterial and reflux studies done years ago which were not all that remarkable. 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 She does have a positive family history and she had large legs starting may be in her 34s. She was recently in hospital from 10/20/2019 through 10/28/2019 with right leg cellulitis. She was given Ancef and clindamycin and then Zosyn when a culture showed Pseudomonas. At that time there was purulent drainage. She was followed by infectious disease Dr Steva Ready. The patient is now back at home. She has noted increased swelling in the right and no drainage in her right leg mostly on the posterior medial aspect in the calf area. She has not had pain or fever. She has literally been improved lysing above dressings because her at the area of this is far too large for standard compression. She has been wrapping the areas with sheets to resorptive pads. She is found these helped somewhat. She does have an appointment with the lymphedema clinic in [place] in late June. Past medical history includes bilateral lymphedema, hypertension, obstructive sleep  apnea with CPAP. Recent hospitalization with apparently Pseudomonas cellulitis of the right lower leg 12/15/2019 upon evaluation today patient appears to be doing a little bit worse in regard to her right lower extremity. Unfortunately she is having more weeping down in the lower portion of her leg. Fortunately there is no signs of active infection at this time. No fever chills noted. The patient states she  is not having increased pain except for when she attempted to use the lymphedema pumps unfortunately she states that she did have pain when she did this. Otherwise we been using absorptive dressings of one type or another she is using diapers at home and then subsequently Ace wraps. In regard to the barrier cream we have discussed the possibility of derma cloud which she would like to try I do not have a problem with that. 12/22/2019 upon evaluation today patient actually appears to be doing better in regard to her leg ulcers at this point. Fortunately there does not appear to be any signs of active infection which is great news and I am extremely pleased with where things are progressing at this time. There is no sign of active infection currently. The patient is very pleased to see things doing so well. 12/29/2019 upon evaluation today patient appears to be doing a little bit better in regard to her weeping in general over her lower extremities. She does have some signs of mild erythema little bit more than what I noted last week or rather last visit. Nonetheless I think that my threshold for switching her antibiotics from Keflex to something else is very low at this point considering that she has had such severe infections in the past that seem to come almost out of nowhere. There is a little erythema and warmth noted of the lower portion of her leg compared to the upper which also makes me want to go ahead and address things more rapidly at this point. Likely I would switch out the Keflex for something like Levaquin ideally. 7/16; patient with severe bilateral lymphedema. She has superficial wounds albeit almost circumferential now on the left lateral lower leg. This may be new from last time. Small area on the right anterior lower leg and then another area on the right medial lower leg and of pannus fold. She has been using various absorptive garments. She states she is using her compression pumps  once a day occasionally twice. Culture from her last visit here was negative 01/29/2020 on evaluation today patient appears to be doing excellent at this point in regard to her legs with regard to infection I see no signs of active infection at this point. She still does have unfortunately areas of weeping this is minimal on the right now her left is actually significantly worse although I do not think it is as bad as last week with Dr. Dellia Nims saw her. She has been trying to pump and elevate her legs is much as possible. She has previously been on the Keflex and in the past for prevention that seems to do fairly well and likely can extend that today. 02/04/2020 on evaluation today patient appears to be doing better in regard to her legs bilaterally. Fortunately there is no signs of active infection at this time which is great news and overall she has less weeping on the left compared to the right and there is several spots where she is pretty much sealed up with no draining regions. Overall very pleased in this regard. 02/19/2020 on  evaluation today patient appears to be doing very well in regard to her wounds currently. Fortunately there is no evidence of active infection overall very pleased with where things stand. She is significantly improved in regard to her edema I am extremely pleased in this regard she tells me that the popping no longer hurts and in fact she actually looks forward to it. 03/04/2020 on evaluation today patient appears to be doing excellent in regard to her lower extremities. Fortunately there is no signs of active infection at this time. No fevers, chills, nausea, vomiting, or diarrhea. Objective Constitutional Obese and well-hydrated in no acute distress. Vitals Time Taken: 9:00 AM, Height: 63 in, Source: Stated, Weight: 370 lbs, Source: Stated, BMI: 65.5, Temperature: 98.1 F, Pulse: 63 bpm, Respiratory Rate: 22 breaths/min, Blood Pressure: 155/94 mmHg. Respiratory normal  breathing without difficulty. [name], [name] ([id number]) Psychiatric this patient is able to make decisions and demonstrates good insight into disease process. Alert and Oriented x 3. pleasant and cooperative. General Notes: Overall patient appears to be doing excellent in regard to her lower extremities she has been pumping and keeping things under great control as far as the edema is concerned she is able to walk more easily and overall feels much better which is awesome news. Other Condition(s) Patient presents with Lymphedema located on the Right Leg. Patient presents with Lymphedema located on the Left Leg. 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 - and as needed Follow-up Appointments: Return Appointment in 3 weeks. Edema Control: Elevate legs to the level of the heart and pump ankles as often as possible Compression Pump: Use compression pump on left lower extremity for 60 minutes, twice daily. Compression Pump: Use compression pump on right lower extremity for 60 minutes, twice daily. Other: - ACE wraps 1. I am going to suggest currently that we actually go ahead and initiate the treatment orders as mentioned above. Specifically we will go to be using absorptive pads in order to help catch any drainage. When I can use Xtrasorb and ABDs as she states this cuts into her leg. 2. I am also can recommend at this time that the patient should change this either 1 or 2 times a day depending on how often she needs to for drainage purposes to keep things nice and dry. 3. She will continue use her lymphedema pump she actually has gotten to the point where  she loves those at this time. We will see patient back for reevaluation in 3 weeks here in the clinic. If anything worsens or changes patient will contact our office for additional recommendations. Electronic Signature(s) Signed: 03/04/2020 10:09:55 AM By: Worthy Keeler PA-C Entered By: Worthy Keeler on 03/04/2020 10:09:55 [name], [name] ([id number]) -------------------------------------------------------------------------------- SuperBill Details Patient Name: [name] Date of Service: 03/04/2020 Medical Record Number: [id number] Patient Account Number: [id number] Date of Birth/Sex: [birth year] (64 y.o. 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.0 Hereditary lymphedema L97.811 Non-pressure chronic ulcer of other part of right lower leg limited to breakdown of skin L97.821 Non-pressure chronic ulcer of other part of  left lower leg limited to breakdown of skin Facility Procedures CPT4 Code: 75916384 Description: 66599 - WOUND CARE VISIT-LEV 3 EST PT Modifier: Quantity: 1 Physician Procedures CPT4 Code: [id number] Description: 93903 - WC PHYS LEVEL 3 - EST PT Modifier: Quantity: 1 CPT4 Code: Description: ICD-10 Diagnosis Description Q82.0 Hereditary lymphedema L97.811 Non-pressure chronic ulcer of other part of right lower leg limited to bre L97.821 Non-pressure chronic ulcer of other part of left lower leg limited to brea Modifier: akdown of skin kdown of skin Quantity: Electronic Signature(s) Signed: 03/04/2020 10:10:08 AM By: Worthy Keeler PA-C Entered By: Worthy Keeler on 03/04/2020 10:10:08

## 2020-03-25 ENCOUNTER — Other Ambulatory Visit: Payer: Self-pay

## 2020-03-25 ENCOUNTER — Encounter: Payer: Medicaid Other | Attending: Physician Assistant | Admitting: Physician Assistant

## 2020-03-25 ENCOUNTER — Other Ambulatory Visit
Admission: RE | Admit: 2020-03-25 | Discharge: 2020-03-25 | Disposition: A | Discharge status: Home / Self Care | Payer: Medicaid Other | Source: Ambulatory Visit | Attending: Physician Assistant | Admitting: Physician Assistant

## 2020-03-25 DIAGNOSIS — Z6841 Body Mass Index (BMI) 40.0 and over, adult: Secondary | ICD-10-CM | POA: Insufficient documentation

## 2020-03-25 DIAGNOSIS — I10 Essential (primary) hypertension: Secondary | ICD-10-CM | POA: Diagnosis not present

## 2020-03-25 DIAGNOSIS — L97811 Non-pressure chronic ulcer of other part of right lower leg limited to breakdown of skin: Secondary | ICD-10-CM | POA: Insufficient documentation

## 2020-03-25 DIAGNOSIS — E669 Obesity, unspecified: Secondary | ICD-10-CM | POA: Diagnosis not present

## 2020-03-25 DIAGNOSIS — L089 Local infection of the skin and subcutaneous tissue, unspecified: Secondary | ICD-10-CM | POA: Diagnosis not present

## 2020-03-25 DIAGNOSIS — G4733 Obstructive sleep apnea (adult) (pediatric): Secondary | ICD-10-CM | POA: Diagnosis not present

## 2020-03-25 DIAGNOSIS — I890 Lymphedema, not elsewhere classified: Secondary | ICD-10-CM | POA: Diagnosis present

## 2020-03-25 DIAGNOSIS — Q820 Hereditary lymphedema: Secondary | ICD-10-CM | POA: Diagnosis not present

## 2020-03-25 DIAGNOSIS — L97821 Non-pressure chronic ulcer of other part of left lower leg limited to breakdown of skin: Secondary | ICD-10-CM | POA: Insufficient documentation

## 2020-03-25 DIAGNOSIS — E11621 Type 2 diabetes mellitus with foot ulcer: Secondary | ICD-10-CM | POA: Insufficient documentation

## 2020-03-25 NOTE — Progress Notes (Addendum)
[name] ([id number]) Visit Report for 03/25/2020 Chief Complaint Document Details Patient Name: [name], [name]. Date of Service: 03/25/2020 10:45 AM Medical Record Number: [id number] Patient Account Number: [id number] Date of Birth/Sex: 1956/06/12 (64 y.o. F) Treating RN: Grover Canavan Primary Care Provider: Tandy Gaw Other Clinician: Referring Provider: Tandy Gaw Treating Provider/Extender: Melburn Hake, Nehal Shives Weeks in Treatment: 17 Information Obtained from: Patient Chief Complaint Bilateral LE lymphedema Electronic Signature(s) Signed: 03/25/2020 10:56:42 AM By: Worthy Keeler PA-C Entered By: Worthy Keeler on 03/25/2020 10:56:42 Portal, [name] ([id number]) -------------------------------------------------------------------------------- HPI Details Patient Name: [name]. Date of Service: 03/25/2020 10:45 AM Medical Record Number: [id number] Patient Account Number: [id number] Date of Birth/Sex: 07-Aug-1955 (64 y.o. F) Treating RN: Grover Canavan Primary Care Provider: Tandy Gaw Other Clinician: Referring Provider: Tandy Gaw Treating Provider/Extender: STONE III, Lashe Oliveira Weeks in Treatment: 17 History of Present Illness HPI Description: The patient is a 64 year old female with history of hypertension and a long-standing history of bilateral lower extremity lymphedema (first presented on 4/2) . She has had open ulcers in the past which have always responded to compression therapy. She had briefly been to a lymphedema clinic in the past which helped her at the time. this time around she stopped treatment of her lymphedema pumps approximately 2 weeks ago because of some pain in the knees and then noticed the right leg getting worse. She was seen by her PCP who put her on clindamycin 4 times a day 2 days ago. The patient has seen [facility] and Dr. Delana Meyer had seen her last year where a vascular study including venous and arterial duplex studies were  within normal limits. he had recommended compression stockings and lymphedema pumps and the patient has been using this in about 2 weeks ago. She is known to be diabetic but in the past few time she's gone to her primary care doctor her hemoglobin A1c has been normal. 02/11/2015 - after her last visit she took my advice and went to the ER regarding the progressive cellulitis of her right lower extremity and she was admitted between July 17 and 22nd. She received IV antibiotics and then was sent home on a course of steroid-induced and oral antibiotics. She has improved much since then. 02/17/2015 -- she has been doing fine and the weeping of her legs has remarkably gone down. She has no fresh issues. READMISSION 01/15/18 This patient was given this clinic before most recently in 2016 seen by Dr. Con Memos. She has massive bilateral lymphedema and over the last 2 months this had weeping edema out of the left leg. She has compression pumps but her compliance with these has been minimal. She has [facility] they've been using TCA/ABDs/kerlix under an Ace wrap.she has had recent problems with cellulitis. She was apparently seen in the ER and 12/23/17 and given clindamycin. She was then followed by her primary doctor and given doxycycline and Keflex. The pain seems to have settled down. In April 2018 the patient had arterial studies done at [facility]. This showed triphasic waveforms throughout the right leg and mostly triphasic waveforms on the left except for monophasic at the posterior tibial artery distally. She was not felt to have evidence of right lower extremity arterial stenosis or significant problems on the left side. 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 She also had a right lower extremity venous Doppler in January 2018 this was limited by the patient's body habitus and lymphedema. Most of the  proximal veins were not visualized The patient  presents with an area of denuded skin on the anterior medial part of the left calf. There is weeping edema fluid here. 01/22/18; the patient has somewhat better edema control using her compression pumps twice a day and as a result she has much better epithelialization on the left anterior calf area. Only a small open area remains. 01/29/18; the patient has been compliant with her compression pumps. Both the areas on her calf that healed. The remaining area on the left anterior leg is fully epithelialized Readmission: 02/20/2019 upon evaluation today patient presents for reevaluation due to issues that she is having with the bilateral lower extremities. She actually has wounds open on both legs. On the right she has an area in the crease of her leg on the right around the knee region which is actually draining quite a bit and actually has some fungal type appearance to it. She has been on nystatin powder that seems to have helped to some degree. In regard to the left lower extremity this is actually in the lower portion of her leg closer to the ankle and again is continuing to drain as well unfortunately. There does not appear to be any signs of active infection at this time which is good news. No fevers, chills, nausea, vomiting, or diarrhea. She tells me that since she was seen last year she is actually been doing quite well for the most part with regard to her lower extremities. Unfortunately she now is experiencing a little bit more drainage at this time. She is concerned about getting this under control so that it does not get significantly worse. 02/27/2019 on evaluation today patient appears to be doing somewhat better in regard to her bilateral lower extremity wounds. She has been tolerating the dressing changes without complication. Fortunately there is no signs of active infection at this point. No fevers, chills, nausea, vomiting, or diarrhea. She did get her dressing supplies which is  excellent news she was extremely excited to get these. She also got paperwork from [employer] for their financial assistance program where they may be able to help her out in the future if needed with supplies at discounted prices. 03/06/2019 on evaluation today patient appears to be doing a little worse with regard to both areas of weeping on her bilateral lower extremities. This is around the right medial knee and just above the left ankle. With that being said she is unfortunately not doing as well as I would like to see. I feel like she may need to potentially go see someone at the lymphedema clinic as the wraps that she needs or even beyond what we can do here at the wound care center. She really does not have wounds she just has open areas of weeping that are causing some difficulty for her. Subsequently because of this and the moisture I am concerned about the potential for infection I am going to likely give her a prophylactic antibiotic today, Keflex, just to be on the safe side. Nonetheless again there is no obvious signs of active infection at this time. 03/13/2019 on evaluation today patient appears to be doing well with regard to her bilateral lower extremities where she has been weeping compared to even last week's evaluation. I see some areas of new skin growth which is excellent and overall I am very pleased with how things seem to be progressing. No fevers, chills, nausea, vomiting, or diarrhea. SYNAI, PRETTYMAN ([id number]) 03/20/2019 on evaluation  today patient unfortunately is continuing to have issues with significant edema of the left lower extremity. Her right side seems to be doing much better. Unfortunately her left side is showing increased weeping of the lower portion of her leg. This is quite unfortunate obviously we were hoping to get her into the lymphedema clinic they really do not seem to when I see her how if she is draining. Despite the fact this is really not wound related  but more lymphedema weeping related. Nonetheless I do not know that this can be helpful for her to even go for that appointment since again I am not sure there is much that they would actually do at this point. We may need to try a 4 layer compression wrap as best we can on her leg. She is on the Augmentin currently although I am still concerned about whether or not there could be potentially something going on infection wise I would obtain a culture though I understand is not the best being that is a surface culture I just 1 to make sure I do not seem to be missing anything. 03/27/2019 on evaluation today patient appears to be doing much better in regard to the left lower extremity compared to last week. Last week she had tremendous weeping which I think was subsequent to infection now she seems to be doing much better and very pleased. This is not completely healed but there is a lot of new skin growth and it has dried out quite a bit. Overall I think that we are doing well with how things are moving along at this time. No fevers, chills, nausea, vomiting, or diarrhea. 04/03/2019 evaluation today patient appears to be doing a little worse this week compared to last time I saw her. I think this may be due to the fact that she is having issues with not being able to sleep in her bed at least not until last night. She is therefore been in a lift chair and subsequently has also had issues with not been able to use her pumps since she could not get in bed. With that being said the patient overall seems to be doing okay I do think I may want extend the antibiotic for a little bit longer at least until we can see if her edema and her weeping gets better and if it is then obviously I can always discontinue the antibiotics as of next week however I want her to continue to have it over the next week. 04/10/2019 on evaluation today patient unfortunately is still doing poorly with regard to her left lower  extremity. Her right is all things considering doing fairly well. On the left however she continues to have spreading of the area of infection and weeping which appears to be even a larger surface area than noted last week. She did have a positive culture for Pseudomonas in particular which seems to have been of concern she still has green/yellow discharge consistent with Pseudomonas and subsequently a tremendous amount of it. This has me obviously still concerned about the infection not really clearing up despite the fact that on culture it appears the Cipro should have been a good option for treating this. I think she may at this point need IV antibiotics since things are not doing better I do not want to get worse and cause sepsis. She is in agreement with the plan and believes as well that she likely does need to go to the hospital for  IV vancomycin. Or something of the like depending on what the recommendation is from the ER. 04/17/2019 on evaluation today patient appears to be doing excellent in regard to her lower extremity on the left. She was in the hospital for several days from when I sent her last we saw her until just this past Tuesday. Fortunately her drainage is significantly improved and in fact is mostly clear. There is just a couple small areas that may still drain a little bit she states that the [facility] they prescribed for her at discharge she went picked up from pharmacy and got home but has not been able to find it since. She is looked everywhere. She is wondering if I will replace that for her today I will be more than happy to do that. 05/01/2019 on evaluation today patient actually appears to be doing quite well with regard to her lower extremities. She occasionally is having areas that will leak and then heal up mainly when a piece of the fibrotic skin pops off but fortunately she is not having any signs of active infection at this time. Overall she also really does not have  any obvious weeping at this time. I do believe however she really needs some compression wraps and I think this may be a good time to get her back to the lymphedema clinic. 05/11/2019 on evaluation today patient actually appears to be doing quite well with regard to her bilateral lower extremities. She occasionally will have a small area that we per another but in general seems to be completely healed which is great news. Overall very pleased with how everything seems to be progressing. She does have her appointment with lymphedema clinic on November 18. 05/25/2019 on evaluation today patient appears to be doing well with regard to her left lower extremity. I am very pleased in this regard. In regard to her right leg this actually did start draining more I think it is mainly due to the fact that her leg is more swollen. I am not seeing any obvious signs of infection at this time although that is definitely something were obviously acutely aware of simply due to the fact that she had an issue not too far back with exactly this issue. Nonetheless I do feel like that lymphedema clinic would still be beneficial for her. I explained obviously if they are not able to do anything treatment wise on the right leg we could at least have them treat her left leg and then proceed from there. The patient is really in agreement with that plan. If they are able to do both as the drainage slows down that I would be happy to let them handle both. 06/01/2019 on evaluation today patient unfortunately appears to be doing worse with regard to her right lower extremity. The left lower extremity is still maintaining at this point. Unfortunately she has been having significantly increased pain over the past several days and has been experiencing as well increased swelling of the right lower extremity. I really do not know that I am seeing anything that appears to be obvious for infection at this point to be peripherally  honest. With that being said the patient does seem to be having much more swelling that she is even experienced in the past and coupled with increased pain in her hip as well I am concerned that again she could potentially have a DVT although I am not 100% sure of this. I think it something that may need to  be checked out. We discussed the possibility of sending her for a DVT study through the hospital but unfortunately transportation is an issue if she does have a DVT I do not want her to wait days to be able to get in for that test however if she has this scheduled as an outpatient that is as fast that she will be able to get the test scheduled for transportation purposes. That will also fall on Thanksgiving so subsequently she did actually be looking at either Friday or even next week before we would know anything back from this. That is much too long in my opinion. Subsequent to the amount of discomfort she is experiencing the patient is actually okay with going to the ER for evaluation today. 06/12/2019 on evaluation today patient actually appears to be doing significantly better compared to last time I saw her. Following when I last saw her she was actually in the hospital from that Monday until the following Sunday almost 1 full week. She actually was placed on Keflex in the hospital following the time for her to be discharged and Dr. Steva Ready has recommended 2 times a day dosing of the Keflex for the next year in order to help with more prophylactic/preventative measures with regard to her developing cellulitis. Overall I think this sounds like an excellent plan. The patient unfortunately is good to have trouble being treated at lymphedema clinic due to the fact that she really cannot get up on the bed that they have there. They also state that they cannot manage her as long as she has anything draining at this point. Obviously that is somewhat unfortunate as she does need help with edema  control but nonetheless we will have to do what we can for her outside of it sounds like the lymphedema clinic scenario at this point. 06/19/2019 on evaluation today patient appears to be doing fairly well with regard to her bilateral lower extremities. She is not nearly as swollen and shows no signs of infection at this point. There is no evidence of cellulitis whatsoever. She also has no open wounds or draining at this point which is also good news. No fever chills noted. She seems to be in very good spirits and in fact appears to be doing quite well. READMISSION 11/27/2019 SHACOYA, BURKHAMMER ([id number]) This is a 64 year old woman that we have had in this clinic several times before including 2015, 16 and 19 and then most recently from 03/20/2019 through 06/19/2019 with bilateral lower extremity lymphedema. She has had previous arterial and reflux studies done years ago which were not all that remarkable. 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 She does have a positive family history and she had large legs starting may be in her 64s. She was recently in hospital from 10/20/2019 through 10/28/2019 with right leg cellulitis. She was given Ancef and clindamycin and then Zosyn when a culture showed Pseudomonas. At that time there was purulent drainage. She was followed by infectious disease Dr Steva Ready. The patient is now back at home. She has noted increased swelling in the right and no drainage in her right leg mostly on the posterior medial aspect in the calf area. She has not had pain or fever. She has literally been improved lysing above dressings because her at the area of this is far too large for standard compression. She has been wrapping the areas with sheets to resorptive pads. She is found these helped  somewhat. She does have an appointment with the lymphedema clinic in [place] in late June. Past medical history includes bilateral  lymphedema, hypertension, obstructive sleep apnea with CPAP. Recent hospitalization with apparently Pseudomonas cellulitis of the right lower leg 12/15/2019 upon evaluation today patient appears to be doing a little bit worse in regard to her right lower extremity. Unfortunately she is having more weeping down in the lower portion of her leg. Fortunately there is no signs of active infection at this time. No fever chills noted. The patient states she is not having increased pain except for when she attempted to use the lymphedema pumps unfortunately she states that she did have pain when she did this. Otherwise we been using absorptive dressings of one type or another she is using diapers at home and then subsequently Ace wraps. In regard to the barrier cream we have discussed the possibility of derma cloud which she would like to try I do not have a problem with that. 12/22/2019 upon evaluation today patient actually appears to be doing better in regard to her leg ulcers at this point. Fortunately there does not appear to be any signs of active infection which is great news and I am extremely pleased with where things are progressing at this time. There is no sign of active infection currently. The patient is very pleased to see things doing so well. 12/29/2019 upon evaluation today patient appears to be doing a little bit better in regard to her weeping in general over her lower extremities. She does have some signs of mild erythema little bit more than what I noted last week or rather last visit. Nonetheless I think that my threshold for switching her antibiotics from Keflex to something else is very low at this point considering that she has had such severe infections in the past that seem to come almost out of nowhere. There is a little erythema and warmth noted of the lower portion of her leg compared to the upper which also makes me want to go ahead and address things more rapidly at this point.  Likely I would switch out the Keflex for something like Levaquin ideally. 7/16; patient with severe bilateral lymphedema. She has superficial wounds albeit almost circumferential now on the left lateral lower leg. This may be new from last time. Small area on the right anterior lower leg and then another area on the right medial lower leg and of pannus fold. She has been using various absorptive garments. She states she is using her compression pumps once a day occasionally twice. Culture from her last visit here was negative 01/29/2020 on evaluation today patient appears to be doing excellent at this point in regard to her legs with regard to infection I see no signs of active infection at this point. She still does have unfortunately areas of weeping this is minimal on the right now her left is actually significantly worse although I do not think it is as bad as last week with Dr. Dellia Nims saw her. She has been trying to pump and elevate her legs is much as possible. She has previously been on the Keflex and in the past for prevention that seems to do fairly well and likely can extend that today. 02/04/2020 on evaluation today patient appears to be doing better in regard to her legs bilaterally. Fortunately there is no signs of active infection at this time which is great news and overall she has less weeping on the left compared to the  right and there is several spots where she is pretty much sealed up with no draining regions. Overall very pleased in this regard. 02/19/2020 on evaluation today patient appears to be doing very well in regard to her wounds currently. Fortunately there is no evidence of active infection overall very pleased with where things stand. She is significantly improved in regard to her edema I am extremely pleased in this regard she tells me that the popping no longer hurts and in fact she actually looks forward to it. 03/04/2020 on evaluation today patient appears to be doing  excellent in regard to her lower extremities. Fortunately there is no signs of active infection at this time. No fevers, chills, nausea, vomiting, or diarrhea. 03/25/2020 on evaluation today patient appears to be doing a little bit more poorly in regard to her legs at this point. She tells me that she is still continue to have issues with drainage and this has been a little bit worse she was getting ready to start taking the Keflex again but wanted to see me first. Fortunately there is no signs of active infection at this time. No fevers, chills, nausea, vomiting, or diarrhea. Electronic Signature(s) Signed: 03/25/2020 5:46:40 PM By: Worthy Keeler PA-C Entered By: Worthy Keeler on 03/25/2020 17:46:40 Wade, [name] ([id number]) -------------------------------------------------------------------------------- Physical Exam Details Patient Name: [name]. Date of Service: 03/25/2020 10:45 AM Medical Record Number: [id number] Patient Account Number: [id number] Date of Birth/Sex: Jun 11, 1956 (64 y.o. F) Treating RN: Grover Canavan Primary Care Provider: Tandy Gaw Other Clinician: Referring Provider: Tandy Gaw Treating Provider/Extender: STONE III, Teela Narducci Weeks in Treatment: 17 Constitutional Obese and well-hydrated in no acute distress. Respiratory normal breathing without difficulty. Psychiatric this patient is able to make decisions and demonstrates good insight into disease process. Alert and Oriented x 3. pleasant and cooperative. Notes Upon inspection patient's legs unfortunately continue to show signs of quite a bit of weeping at this point. I am concerned about the fact that she does not seem to be healing quite as well and effectively as we would like to see. She tells me she is not been pumping as regularly as she should be which is part of the issue here. She also tells me that she has not been elevating her legs quite as much as normal. Electronic  Signature(s) Signed: 03/25/2020 5:48:46 PM By: Worthy Keeler PA-C Entered By: Worthy Keeler on 03/25/2020 17:48:46 Wechter, [name] ([id number]) -------------------------------------------------------------------------------- Physician Orders Details Patient Name: [name]. Date of Service: 03/25/2020 10:45 AM Medical Record Number: [id number] Patient Account Number: [id number] Date of Birth/Sex: March 24, 1956 (64 y.o. 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 - and as needed Follow-up Appointments o Return Appointment in 3 weeks. Edema Control o Elevate legs to the level of the heart and pump ankles as often as possible o Compression Pump: Use compression pump on left lower extremity for 60 minutes, twice daily. o Compression Pump: Use compression pump on right lower extremity for 60 minutes, twice daily. o  Other: - ACE wraps Laboratory o Bacteria identified in Wound by Culture (MICRO) oooo LOINC Code: 1610-9 oooo Convenience Name: Wound culture routine Notes start back on keflex. Electronic Signature(s) Signed: 03/25/2020 4:35:13 PM By: Grover Canavan Signed: 03/25/2020 6:21:17 PM By: Worthy Keeler PA-C Entered By: Grover Canavan on 03/25/2020 11:45:33 Douglas, [name] ([id number]) -------------------------------------------------------------------------------- Problem List  Details Patient Name: SHUNTAY, EVERETTS. Date of Service: 03/25/2020 10:45 AM Medical Record Number: [id number] Patient Account Number: [id number] Date of Birth/Sex: 10/14/1955 (64 y.o. 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 Date of Service: 03/25/2020 10:45 AM Medical Record Number: [id number] Patient Account Number: [id number] Date of Birth/Sex: [birth year] (64 y.o. F) Treating RN: Grover Canavan Primary Care Provider: Tandy Gaw Other Clinician: Referring Provider: Tandy Gaw Treating Provider/Extender: Melburn Hake, Travelle Mcclimans Weeks in Treatment: 17 Subjective Chief Complaint Information obtained from Patient Bilateral LE lymphedema History of Present Illness (HPI) The patient is a 64 year old female with history of hypertension and a long-standing history of bilateral lower extremity lymphedema (first presented on 4/2) . She has had open ulcers in the past which have always responded to compression therapy. She had briefly been to a lymphedema clinic in the past which helped her at the time. this time around she stopped treatment of her lymphedema pumps  approximately 2 weeks ago because of some pain in the knees and then noticed the right leg getting worse. She was seen by her PCP who put her on clindamycin 4 times a day 2 days ago. The patient has seen [facility] and Dr. Delana Meyer had seen her last year where a vascular study including venous and arterial duplex studies were within normal limits. he had recommended compression stockings and lymphedema pumps and the patient has been using this in about 2 weeks ago. She is known to be diabetic but in the past few time she's gone to her primary care doctor her hemoglobin A1c has been normal. 02/11/2015 - after her last visit she took my advice and went to the ER regarding the progressive cellulitis of her right lower extremity and she was admitted between July 17 and 22nd. She received IV antibiotics and then was sent home on a course of steroid-induced and oral antibiotics. She has improved much since then. 02/17/2015 -- she has been doing fine and the weeping of her legs has remarkably gone down. She has no fresh issues. READMISSION 01/15/18 This patient was given this clinic before most recently in 2016 seen by Dr. Con Memos. She has massive bilateral lymphedema and over the last 2 months this had weeping edema out of the left leg. She has compression pumps but her compliance with these has been minimal. She has [facility] they've been using TCA/ABDs/kerlix under an Ace wrap.she has had recent problems with cellulitis. She was apparently seen in the ER and 12/23/17 and given clindamycin. She was then followed by  her primary doctor and given doxycycline and Keflex. The pain seems to have settled down. In April 2018 the patient had arterial studies done at [facility]. This showed triphasic waveforms throughout the right leg and mostly triphasic waveforms on the left except for monophasic at the posterior tibial artery distally. She was not felt to have evidence of right lower extremity  arterial stenosis or significant problems on the left side. 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 She also had a right lower extremity venous Doppler in January 2018 this was limited by the patient's body habitus and lymphedema. Most of the proximal veins were not visualized The patient presents with an area of denuded skin on the anterior medial part of the left calf. There is weeping edema fluid here. 01/22/18; the patient has somewhat better edema control using her compression pumps twice a day and as a result she has much better epithelialization on the left anterior calf area. Only a small open area remains. 01/29/18; the patient has been compliant with her compression pumps. Both the areas on her calf that healed. The remaining area on the left anterior leg is fully epithelialized Readmission: 02/20/2019 upon evaluation today patient presents for reevaluation due to issues that she is having with the bilateral lower extremities. She actually has wounds open on both legs. On the right she has an area in the crease of her leg on the right around the knee region which is actually draining quite a bit and actually has some fungal type appearance to it. She has been on nystatin powder that seems to have helped to some degree. In regard to the left lower extremity this is actually in the lower portion of her leg closer to the ankle and again is continuing to drain as well unfortunately. There does not appear to be any signs of active infection at this time which is good news. No fevers, chills, nausea, vomiting, or diarrhea. She tells me that since she was seen last year she is actually been doing quite well for the most part with regard to her lower extremities. Unfortunately she now is experiencing a little bit more drainage at this time. She is concerned about getting this under control so that it does not get significantly worse. 02/27/2019 on evaluation today patient  appears to be doing somewhat better in regard to her bilateral lower extremity wounds. She has been tolerating the dressing changes without complication. Fortunately there is no signs of active infection at this point. No fevers, chills, nausea, vomiting, or diarrhea. She did get her dressing supplies which is excellent news she was extremely excited to get these. She also got paperwork from [employer] for their financial assistance program where they may be able to help her out in the future if needed with supplies at discounted prices. 03/06/2019 on evaluation today patient appears to be doing a little worse with regard to both areas of weeping on her bilateral lower extremities. This is around the right medial knee and just above the left ankle. With that being said she is unfortunately not doing as well as I would like to see. I feel like she may need to potentially go see someone at the lymphedema clinic as the wraps that she needs or even beyond what we can do here at the wound care center. She really does not have wounds she just has open areas of weeping that are causing some difficulty for her. Subsequently because of this  and the moisture I am concerned about the potential for infection I am going to likely give her a prophylactic antibiotic today, Keflex, just to be on the safe side. Nonetheless again there is no obvious signs of active infection at this time. LEMMIE, STEINHAUS ([id number]) 03/13/2019 on evaluation today patient appears to be doing well with regard to her bilateral lower extremities where she has been weeping compared to even last week's evaluation. I see some areas of new skin growth which is excellent and overall I am very pleased with how things seem to be progressing. No fevers, chills, nausea, vomiting, or diarrhea. 03/20/2019 on evaluation today patient unfortunately is continuing to have issues with significant edema of the left lower extremity. Her right side seems to be  doing much better. Unfortunately her left side is showing increased weeping of the lower portion of her leg. This is quite unfortunate obviously we were hoping to get her into the lymphedema clinic they really do not seem to when I see her how if she is draining. Despite the fact this is really not wound related but more lymphedema weeping related. Nonetheless I do not know that this can be helpful for her to even go for that appointment since again I am not sure there is much that they would actually do at this point. We may need to try a 4 layer compression wrap as best we can on her leg. She is on the Augmentin currently although I am still concerned about whether or not there could be potentially something going on infection wise I would obtain a culture though I understand is not the best being that is a surface culture I just 1 to make sure I do not seem to be missing anything. 03/27/2019 on evaluation today patient appears to be doing much better in regard to the left lower extremity compared to last week. Last week she had tremendous weeping which I think was subsequent to infection now she seems to be doing much better and very pleased. This is not completely healed but there is a lot of new skin growth and it has dried out quite a bit. Overall I think that we are doing well with how things are moving along at this time. No fevers, chills, nausea, vomiting, or diarrhea. 04/03/2019 evaluation today patient appears to be doing a little worse this week compared to last time I saw her. I think this may be due to the fact that she is having issues with not being able to sleep in her bed at least not until last night. She is therefore been in a lift chair and subsequently has also had issues with not been able to use her pumps since she could not get in bed. With that being said the patient overall seems to be doing okay I do think I may want extend the antibiotic for a little bit longer at least  until we can see if her edema and her weeping gets better and if it is then obviously I can always discontinue the antibiotics as of next week however I want her to continue to have it over the next week. 04/10/2019 on evaluation today patient unfortunately is still doing poorly with regard to her left lower extremity. Her right is all things considering doing fairly well. On the left however she continues to have spreading of the area of infection and weeping which appears to be even a larger surface area than noted last week. She did have  a positive culture for Pseudomonas in particular which seems to have been of concern she still has green/yellow discharge consistent with Pseudomonas and subsequently a tremendous amount of it. This has me obviously still concerned about the infection not really clearing up despite the fact that on culture it appears the Cipro should have been a good option for treating this. I think she may at this point need IV antibiotics since things are not doing better I do not want to get worse and cause sepsis. She is in agreement with the plan and believes as well that she likely does need to go to the hospital for IV vancomycin. Or something of the like depending on what the recommendation is from the ER. 04/17/2019 on evaluation today patient appears to be doing excellent in regard to her lower extremity on the left. She was in the hospital for several days from when I sent her last we saw her until just this past Tuesday. Fortunately her drainage is significantly improved and in fact is mostly clear. There is just a couple small areas that may still drain a little bit she states that the [facility] they prescribed for her at discharge she went picked up from pharmacy and got home but has not been able to find it since. She is looked everywhere. She is wondering if I will replace that for her today I will be more than happy to do that. 05/01/2019 on evaluation today patient  actually appears to be doing quite well with regard to her lower extremities. She occasionally is having areas that will leak and then heal up mainly when a piece of the fibrotic skin pops off but fortunately she is not having any signs of active infection at this time. Overall she also really does not have any obvious weeping at this time. I do believe however she really needs some compression wraps and I think this may be a good time to get her back to the lymphedema clinic. 05/11/2019 on evaluation today patient actually appears to be doing quite well with regard to her bilateral lower extremities. She occasionally will have a small area that we per another but in general seems to be completely healed which is great news. Overall very pleased with how everything seems to be progressing. She does have her appointment with lymphedema clinic on November 18. 05/25/2019 on evaluation today patient appears to be doing well with regard to her left lower extremity. I am very pleased in this regard. In regard to her right leg this actually did start draining more I think it is mainly due to the fact that her leg is more swollen. I am not seeing any obvious signs of infection at this time although that is definitely something were obviously acutely aware of simply due to the fact that she had an issue not too far back with exactly this issue. Nonetheless I do feel like that lymphedema clinic would still be beneficial for her. I explained obviously if they are not able to do anything treatment wise on the right leg we could at least have them treat her left leg and then proceed from there. The patient is really in agreement with that plan. If they are able to do both as the drainage slows down that I would be happy to let them handle both. 06/01/2019 on evaluation today patient unfortunately appears to be doing worse with regard to her right lower extremity. The left lower extremity is still maintaining at  this point.  Unfortunately she has been having significantly increased pain over the past several days and has been experiencing as well increased swelling of the right lower extremity. I really do not know that I am seeing anything that appears to be obvious for infection at this point to be peripherally honest. With that being said the patient does seem to be having much more swelling that she is even experienced in the past and coupled with increased pain in her hip as well I am concerned that again she could potentially have a DVT although I am not 100% sure of this. I think it something that may need to be checked out. We discussed the possibility of sending her for a DVT study through the hospital but unfortunately transportation is an issue if she does have a DVT I do not want her to wait days to be able to get in for that test however if she has this scheduled as an outpatient that is as fast that she will be able to get the test scheduled for transportation purposes. That will also fall on Thanksgiving so subsequently she did actually be looking at either Friday or even next week before we would know anything back from this. That is much too long in my opinion. Subsequent to the amount of discomfort she is experiencing the patient is actually okay with going to the ER for evaluation today. 06/12/2019 on evaluation today patient actually appears to be doing significantly better compared to last time I saw her. Following when I last saw her she was actually in the hospital from that Monday until the following Sunday almost 1 full week. She actually was placed on Keflex in the hospital following the time for her to be discharged and Dr. Steva Ready has recommended 2 times a day dosing of the Keflex for the next year in order to help with more prophylactic/preventative measures with regard to her developing cellulitis. Overall I think this sounds like an excellent plan. The patient unfortunately is  good to have trouble being treated at lymphedema clinic due to the fact that she really cannot get up on the bed that they have there. They also state that they cannot manage her as long as she has anything draining at this point. Obviously that is somewhat unfortunate as she does need help with edema control but nonetheless we will have to do what we can for her outside of it sounds like the lymphedema clinic scenario at this point. 06/19/2019 on evaluation today patient appears to be doing fairly well with regard to her bilateral lower extremities. She is not nearly as swollen Sokoloski, Shaterra C. ([id number]) and shows no signs of infection at this point. There is no evidence of cellulitis whatsoever. She also has no open wounds or draining at this point which is also good news. No fever chills noted. She seems to be in very good spirits and in fact appears to be doing quite well. READMISSION 11/27/2019 This is a 64 year old woman that we have had in this clinic several times before including 2015, 16 and 19 and then most recently from 03/20/2019 through 06/19/2019 with bilateral lower extremity lymphedema. She has had previous arterial and reflux studies done years ago which were not all that remarkable. 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 She does have a positive family history and she had large legs starting may be in her 37s. She was recently in hospital from 10/20/2019 through 10/28/2019 with  right leg cellulitis. She was given Ancef and clindamycin and then Zosyn when a culture showed Pseudomonas. At that time there was purulent drainage. She was followed by infectious disease Dr Steva Ready. The patient is now back at home. She has noted increased swelling in the right and no drainage in her right leg mostly on the posterior medial aspect in the calf area. She has not had pain or fever. She has literally been improved lysing above  dressings because her at the area of this is far too large for standard compression. She has been wrapping the areas with sheets to resorptive pads. She is found these helped somewhat. She does have an appointment with the lymphedema clinic in [place] in late June. Past medical history includes bilateral lymphedema, hypertension, obstructive sleep apnea with CPAP. Recent hospitalization with apparently Pseudomonas cellulitis of the right lower leg 12/15/2019 upon evaluation today patient appears to be doing a little bit worse in regard to her right lower extremity. Unfortunately she is having more weeping down in the lower portion of her leg. Fortunately there is no signs of active infection at this time. No fever chills noted. The patient states she is not having increased pain except for when she attempted to use the lymphedema pumps unfortunately she states that she did have pain when she did this. Otherwise we been using absorptive dressings of one type or another she is using diapers at home and then subsequently Ace wraps. In regard to the barrier cream we have discussed the possibility of derma cloud which she would like to try I do not have a problem with that. 12/22/2019 upon evaluation today patient actually appears to be doing better in regard to her leg ulcers at this point. Fortunately there does not appear to be any signs of active infection which is great news and I am extremely pleased with where things are progressing at this time. There is no sign of active infection currently. The patient is very pleased to see things doing so well. 12/29/2019 upon evaluation today patient appears to be doing a little bit better in regard to her weeping in general over her lower extremities. She does have some signs of mild erythema little bit more than what I noted last week or rather last visit. Nonetheless I think that my threshold for switching her antibiotics from Keflex to something else is  very low at this point considering that she has had such severe infections in the past that seem to come almost out of nowhere. There is a little erythema and warmth noted of the lower portion of her leg compared to the upper which also makes me want to go ahead and address things more rapidly at this point. Likely I would switch out the Keflex for something like Levaquin ideally. 7/16; patient with severe bilateral lymphedema. She has superficial wounds albeit almost circumferential now on the left lateral lower leg. This may be new from last time. Small area on the right anterior lower leg and then another area on the right medial lower leg and of pannus fold. She has been using various absorptive garments. She states she is using her compression pumps once a day occasionally twice. Culture from her last visit here was negative 01/29/2020 on evaluation today patient appears to be doing excellent at this point in regard to her legs with regard to infection I see no signs of active infection at this point. She still does have unfortunately areas of weeping this is minimal  on the right now her left is actually significantly worse although I do not think it is as bad as last week with Dr. Dellia Nims saw her. She has been trying to pump and elevate her legs is much as possible. She has previously been on the Keflex and in the past for prevention that seems to do fairly well and likely can extend that today. 02/04/2020 on evaluation today patient appears to be doing better in regard to her legs bilaterally. Fortunately there is no signs of active infection at this time which is great news and overall she has less weeping on the left compared to the right and there is several spots where she is pretty much sealed up with no draining regions. Overall very pleased in this regard. 02/19/2020 on evaluation today patient appears to be doing very well in regard to her wounds currently. Fortunately there is no evidence  of active infection overall very pleased with where things stand. She is significantly improved in regard to her edema I am extremely pleased in this regard she tells me that the popping no longer hurts and in fact she actually looks forward to it. 03/04/2020 on evaluation today patient appears to be doing excellent in regard to her lower extremities. Fortunately there is no signs of active infection at this time. No fevers, chills, nausea, vomiting, or diarrhea. 03/25/2020 on evaluation today patient appears to be doing a little bit more poorly in regard to her legs at this point. She tells me that she is still continue to have issues with drainage and this has been a little bit worse she was getting ready to start taking the Keflex again but wanted to see me first. Fortunately there is no signs of active infection at this time. No fevers, chills, nausea, vomiting, or diarrhea. Objective Constitutional Obese and well-hydrated in no acute distress. Vitals Time Taken: 10:55 AM, Height: 63 in, Weight: 370 lbs, BMI: 65.5, Temperature: 98.2 F, Pulse: 65 bpm, Respiratory Rate: 18 breaths/min, Blood Pressure: 159/88 mmHg. MARCEA, ROJEK ([id number]) Respiratory normal breathing without difficulty. Psychiatric this patient is able to make decisions and demonstrates good insight into disease process. Alert and Oriented x 3. pleasant and cooperative. General Notes: Upon inspection patient's legs unfortunately continue to show signs of quite a bit of weeping at this point. I am concerned about the fact that she does not seem to be healing quite as well and effectively as we would like to see. She tells me she is not been pumping as regularly as she should be which is part of the issue here. She also tells me that she has not been elevating her legs quite as much as normal. Other Condition(s) Patient presents with Lymphedema located on the Right Leg. Patient presents with Lymphedema located on the Left  Leg. General Notes: Has increased edema, odor and lower leg inflammation on this visit. 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 - and as needed Follow-up Appointments: Return Appointment in 3 weeks. Edema Control: Elevate legs to the level of the heart and pump ankles as often as possible Compression Pump: Use compression pump on left lower extremity for 60 minutes, twice daily. Compression Pump: Use compression pump on right lower extremity  for 60 minutes, twice daily. Other: - ACE wraps Laboratory ordered were: Wound culture routine General Notes: start back on keflex. 1. I would recommend at this time that we actually go ahead and continue with the current measures using the Chux pads to wrap around her legs to catch the drainage she tells me that seems to be the most comfortable and effective for her. 2. I am also can recommend that she needs to reinitiate the use of her lymphedema pumps obviously I think that this is of utmost importance when she is used these things were under much better control. 3. I am also can recommend at this time that the patient continue with elevating her legs is much as possible this also is of utmost importance. 4. I did obtain a wound culture today in order to evaluate for what may be causing her potential infection she has been to start taking the Keflex but has not been taking it currently. We will see patient back for reevaluation in 3 weeks here in the clinic. If anything worsens or changes patient will contact our office for additional recommendations. Electronic Signature(s) Signed: 03/25/2020 5:49:43 PM By: Worthy Keeler  PA-C Entered By: Worthy Keeler on 03/25/2020 17:49:43 Theilen, [name] ([id number]) -------------------------------------------------------------------------------- SuperBill Details Patient Name: [name] Date of Service: 03/25/2020 Medical Record Number: [id number] Patient Account Number: [id number] Date of Birth/Sex: [birth year] (64 y.o. F) Treating RN: Grover Canavan Primary Care Provider: Tandy Gaw Other Clinician: Referring Provider: Tandy Gaw Treating Provider/Extender: Melburn Hake, Cara Thaxton Weeks in Treatment: 17 Diagnosis Coding ICD-10 Codes Code Description Q82.0 Hereditary lymphedema L97.811 Non-pressure chronic ulcer of other part of right lower leg limited to breakdown of skin L97.821 Non-pressure chronic ulcer of other part of left lower leg limited to breakdown of skin Facility Procedures CPT4 Code: 29244628 Description: 99213 - WOUND CARE VISIT-LEV 3 EST PT Modifier: Quantity: 1 Physician Procedures CPT4 Code: 6381771 Description: 16579 - WC PHYS LEVEL 4 - EST PT Modifier: Quantity: 1 CPT4 Code: Description: ICD-10 Diagnosis Description Q82.0 Hereditary lymphedema L97.811 Non-pressure chronic ulcer of other part of right lower leg limited to bre L97.821 Non-pressure chronic ulcer of other part of left lower leg limited to brea Modifier: akdown of skin kdown of skin Quantity: Electronic Signature(s) Signed: 03/25/2020 5:50:33 PM By: Worthy Keeler PA-C Entered By: Worthy Keeler on 03/25/2020 17:50:33

## 2020-03-25 NOTE — Progress Notes (Addendum)
SHACOLA, SCHUSSLER ([id number]) Visit Report for 03/25/2020 Arrival Information Details Patient Name: [name], [name]. Date of Service: 03/25/2020 10:45 AM Medical Record Number: [id number] Patient Account Number: [id number] Date of Birth/Sex: 1956-03-10 (64 y.o. F) Treating RN: Grover Canavan Primary Care Jnai Snellgrove: Tandy Gaw Other Clinician: Referring Keawe Marcello: Tandy Gaw Treating Ivone Licht/Extender: Melburn Hake, HOYT Weeks in Treatment: 51 Visit Information History Since Last Visit Added or deleted any medications: No Patient Arrived: Cane Any new allergies or adverse reactions: No Arrival Time: 10:54 Had a fall or experienced change in No Accompanied By: self activities of daily living that may affect Transfer Assistance: None risk of falls: Patient Requires Transmission-Based Precautions: No Signs or symptoms of abuse/neglect since last visito No Patient Has Alerts: No Hospitalized since last visit: No Implantable device outside of the clinic excluding No cellular tissue based products placed in the center since last visit: Has Dressing in Place as Prescribed: Yes Has Compression in Place as Prescribed: Yes Pain Present Now: No Electronic Signature(s) Signed: 03/25/2020 11:44:32 AM By: Darci Needle Entered By: Darci Needle on 03/25/2020 11:20:29 [name] ([id number]) -------------------------------------------------------------------------------- Clinic Level of Care Assessment Details Patient Name: [name]. Date of Service: 03/25/2020 10:45 AM Medical Record Number: [id number] Patient Account Number: [id number] Date of Birth/Sex: 1956-04-19 (64 y.o. F) Treating RN: Grover Canavan Primary Care Aneth Schlagel: Tandy Gaw Other Clinician: Referring Jacky Dross: Tandy Gaw Treating Jaimeson Gopal/Extender: Melburn Hake, HOYT Weeks in Treatment: 17 Clinic Level of Care Assessment Items TOOL 4 Quantity Score []  - Use when only an EandM is performed on  FOLLOW-UP visit 0 ASSESSMENTS - Nursing Assessment / Reassessment X - Reassessment of Co-morbidities (includes updates in patient status) 1 10 X- 1 5 Reassessment of Adherence to Treatment Plan ASSESSMENTS - Wound and Skin Assessment / Reassessment X - Simple Wound Assessment / Reassessment - one wound 1 5 []  - 0 Complex Wound Assessment / Reassessment - multiple wounds []  - 0 Dermatologic / Skin Assessment (not related to wound area) ASSESSMENTS - Focused Assessment X - Circumferential Edema Measurements - multi extremities 2 5 []  - 0 Nutritional Assessment / Counseling / Intervention X- 1 5 Lower Extremity Assessment (monofilament, tuning fork, pulses) []  - 0 Peripheral Arterial Disease Assessment (using hand held doppler) ASSESSMENTS - Ostomy and/or Continence Assessment and Care []  - Incontinence Assessment and Management 0 []  - 0 Ostomy Care Assessment and Management (repouching, etc.) PROCESS - Coordination of Care X - Simple Patient / Family Education for ongoing care 1 15 []  - 0 Complex (extensive) Patient / Family Education for ongoing care []  - 0 Staff obtains Programmer, systems, Records, Test Results / Process Orders []  - 0 Staff telephones HHA, Nursing Homes / Clarify orders / etc []  - 0 Routine Transfer to another Facility (non-emergent condition) []  - 0 Routine Hospital Admission (non-emergent condition) []  - 0 New Admissions / Biomedical engineer / Ordering NPWT, Apligraf, etc. []  - 0 Emergency Hospital Admission (emergent condition) []  - 0 Simple Discharge Coordination []  - 0 Complex (extensive) Discharge Coordination PROCESS - Special Needs []  - Pediatric / Minor Patient Management 0 []  - 0 Isolation Patient Management []  - 0 Hearing / Language / Visual special needs []  - 0 Assessment of Community assistance (transportation, D/C planning, etc.) []  - 0 Additional assistance / Altered mentation []  - 0 Support Surface(s) Assessment (bed, cushion, seat,  etc.) INTERVENTIONS - Wound Cleansing / Measurement [name], Lenea C. ([id number]) []  - 0 Simple Wound Cleansing - one wound X- 2 5 Complex Wound Cleansing -  multiple wounds []  - 0 Wound Imaging (photographs - any number of wounds) []  - 0 Wound Tracing (instead of photographs) []  - 0 Simple Wound Measurement - one wound []  - 0 Complex Wound Measurement - multiple wounds INTERVENTIONS - Wound Dressings []  - Small Wound Dressing one or multiple wounds 0 []  - 0 Medium Wound Dressing one or multiple wounds X- 2 20 Large Wound Dressing one or multiple wounds []  - 0 Application of Medications - topical []  - 0 Application of Medications - injection INTERVENTIONS - Miscellaneous []  - External ear exam 0 []  - 0 Specimen Collection (cultures, biopsies, blood, body fluids, etc.) []  - 0 Specimen(s) / Culture(s) sent or taken to Lab for analysis []  - 0 Patient Transfer (multiple staff / Civil Service fast streamer / Similar devices) []  - 0 Simple Staple / Suture removal (25 or less) []  - 0 Complex Staple / Suture removal (26 or more) []  - 0 Hypo / Hyperglycemic Management (close monitor of Blood Glucose) []  - 0 Ankle / Brachial Index (ABI) - do not check if billed separately X- 1 5 Vital Signs Has the patient been seen at the hospital within the last three years: Yes Total Score: 105 Level Of Care: New/Established - Level 3 Electronic Signature(s) Signed: 03/25/2020 4:35:13 PM By: Grover Canavan Entered By: Grover Canavan on 03/25/2020 11:46:55 Peasley, [name] ([id number]) -------------------------------------------------------------------------------- Encounter Discharge Information Details Patient Name: [name]. Date of Service: 03/25/2020 10:45 AM Medical Record Number: [id number] Patient Account Number: [id number] Date of Birth/Sex: 1955/12/26 (64 y.o. F) Treating RN: Grover Canavan Primary Care Glynnis Gavel: Tandy Gaw Other Clinician: Referring Arnetta Odeh: Tandy Gaw Treating Chalee Hirota/Extender: Melburn Hake, HOYT Weeks in Treatment: 17 Encounter Discharge Information Items Discharge Condition: Stable Ambulatory Status: Cane Discharge Destination: Home Transportation: Private Auto Accompanied By: self Schedule Follow-up Appointment: Yes Clinical Summary of Care: Electronic Signature(s) Signed: 03/25/2020 4:35:13 PM By: Grover Canavan Entered By: Grover Canavan on 03/25/2020 11:48:07 Ciresi, [name] ([id number]) -------------------------------------------------------------------------------- Lower Extremity Assessment Details Patient Name: [name]. Date of Service: 03/25/2020 10:45 AM Medical Record Number: [id number] Patient Account Number: [id number] Date of Birth/Sex: 1955-08-21 (64 y.o. F) Treating RN: Grover Canavan Primary Care Dezarae Mcclaran: Tandy Gaw Other Clinician: Referring Amillya Chavira: Tandy Gaw Treating Ollivander See/Extender: STONE III, HOYT Weeks in Treatment: 17 Edema Assessment Assessed: [Left: Yes] [Right: Yes] Edema: [Left: Yes] [Right: Yes] Calf Left: Right: Point of Measurement: cm From Medial Instep 75 cm 70 cm Ankle Left: Right: Point of Measurement: cm From Medial Instep 49.5 cm 46 cm Vascular Assessment Pulses: Dorsalis Pedis Palpable: [Left:No] [Right:No] Doppler Audible: [Left:Yes] [Right:Yes] Posterior Tibial Palpable: [Left:No] [Right:No] Notes Audible dorsal pulses via doppler. unable to obtain posterior tibial due to severity of edema and overlapping skin. Electronic Signature(s) Signed: 03/25/2020 11:44:32 AM By: Darci Needle Signed: 03/25/2020 4:35:13 PM By: Grover Canavan Entered By: Darci Needle on 03/25/2020 11:18:53 Mayabb, [name] ([id number]) -------------------------------------------------------------------------------- Multi Wound Chart Details Patient Name: [name], [name] Date of Service: 03/25/2020 10:45 AM Medical Record Number: [id number] Patient Account Number:  [id number] Date of Birth/Sex: 04-Oct-1955 (64 y.o. F) Treating RN: Grover Canavan Primary Care Luan Urbani: Tandy Gaw Other Clinician: Referring Makhari Dovidio: Tandy Gaw Treating Shelbe Haglund/Extender: STONE III, HOYT Weeks in Treatment: 17 Vital Signs Height(in): 63 Pulse(bpm): 65 Weight(lbs): 370 Blood Pressure(mmHg): 159/88 Body Mass Index(BMI): 66 Temperature(F): 98.2 Respiratory Rate(breaths/min): 18 Wound Assessments Treatment Notes Electronic Signature(s) Signed: 03/25/2020 4:35:13 PM By: Grover Canavan Entered By: Grover Canavan on 03/25/2020 11:41:41 Dafoe, [name] ([id number]) -------------------------------------------------------------------------------- Multi-Disciplinary Care Plan Details Patient  Name: [name], [name]. Date of Service: 03/25/2020 10:45 AM Medical Record Number: [id number] Patient Account Number: [id number] Date of Birth/Sex: 29-Feb-1956 (64 y.o. F) Treating RN: Grover Canavan Primary Care Sharece Fleischhacker: Tandy Gaw Other Clinician: Referring Zayden Hahne: Tandy Gaw Treating Amoree Newlon/Extender: Melburn Hake, HOYT Weeks in Treatment: 17 Active Inactive Abuse / Safety / Falls / Self Care Management Nursing Diagnoses: Potential for falls Goals: Patient will remain injury free related to falls Date Initiated: 11/27/2019 Target Resolution Date: 02/13/2020 Goal Status: Active Interventions: Assess fall risk on admission and as needed Notes: Orientation to the Wound Care Program Nursing Diagnoses: Knowledge deficit related to the wound healing center program Goals: Patient/caregiver will verbalize understanding of the [facility] Program Date Initiated: 11/27/2019 Target Resolution Date: 02/13/2020 Goal Status: Active Interventions: Provide education on orientation to the wound center Notes: Venous Leg Ulcer Nursing Diagnoses: Potential for venous Insuffiency (use before diagnosis confirmed) Goals: Patient will maintain optimal edema  control Date Initiated: 11/27/2019 Target Resolution Date: 02/13/2020 Goal Status: Active Interventions: Compression as ordered Notes: Electronic Signature(s) Signed: 03/25/2020 4:35:13 PM By: Grover Canavan Entered By: Grover Canavan on 03/25/2020 11:41:29 Lax, [name] ([id number]) -------------------------------------------------------------------------------- Non-Wound Condition Assessment Details Patient Name: [name]. Date of Service: 03/25/2020 10:45 AM Medical Record Number: [id number] Patient Account Number: [id number] Date of Birth/Sex: 07-26-1955 (64 y.o. F) Treating RN: Grover Canavan Primary Care Marquisa Salih: Tandy Gaw Other Clinician: Referring Myrka Sylva: Tandy Gaw Treating Oliveah Zwack/Extender: STONE III, HOYT Weeks in Treatment: 17 Non-Wound Condition: Condition: Lymphedema Location: Leg Side: Right Notes: Right leg with decreased drainage this visit. Photos Electronic Signature(s) Signed: 03/25/2020 11:44:32 AM By: Darci Needle Signed: 03/25/2020 4:35:13 PM By: Grover Canavan Entered By: Darci Needle on 03/25/2020 11:34:49 Catalina, [name] ([id number]) -------------------------------------------------------------------------------- Non-Wound Condition Assessment Details Patient Name: [name], [name] Date of Service: 03/25/2020 10:45 AM Medical Record Number: [id number] Patient Account Number: [id number] Date of Birth/Sex: [birth year] (64 y.o. F) Treating RN: Grover Canavan Primary Care Auriel Kist: Tandy Gaw Other Clinician: Referring Mylie Mccurley: Tandy Gaw Treating Dakoda Laventure/Extender: STONE III, HOYT Weeks in Treatment: 17 Non-Wound Condition: Condition: Lymphedema Location: Leg Side: Left Notes: decreased drainage this visit. Photos Notes Has increased edema, odor and lower leg inflammation on this visit. Electronic Signature(s) Signed: 03/25/2020 11:44:32 AM By: Darci Needle Signed: 03/25/2020 4:35:13 PM By: Grover Canavan Entered By: Darci Needle on 03/25/2020 11:36:05 Gienger, [name] ([id number]) -------------------------------------------------------------------------------- Pain Assessment Details Patient Name: [name], [name]. Date of Service: 03/25/2020 10:45 AM Medical Record Number: [id number] Patient Account Number: [id number] Date of Birth/Sex: [birth year] (64 y.o. F) Treating RN: Grover Canavan Primary Care Grier Czerwinski: Tandy Gaw Other Clinician: Referring Mayan Dolney: Tandy Gaw Treating Christino Mcglinchey/Extender: STONE III, HOYT Weeks in Treatment: 17 Active Problems Location of Pain Severity and Description of Pain Patient Has Paino No Site Locations With Dressing Change: No Pain Management and Medication Current Pain Management: Electronic Signature(s) Signed: 03/25/2020 11:44:32 AM By: Darci Needle Signed: 03/25/2020 4:35:13 PM By: Grover Canavan Entered By: Darci Needle on 03/25/2020 11:20:45 [name] ([id number]) -------------------------------------------------------------------------------- Patient/Caregiver Education Details Patient Name: [name]. Date of Service: 03/25/2020 10:45 AM Medical Record Number: [id number] Patient Account Number: [id number] Date of Birth/Gender: 03/29/1956 (64 y.o. F) Treating RN: Grover Canavan Primary Care Physician: Tandy Gaw Other Clinician: Referring Physician: Tandy Gaw Treating Physician/Extender: Sharalyn Ink in Treatment: 17 Education Assessment Education Provided To: Patient Education Topics Provided Wound/Skin Impairment: Handouts: Caring for Your Ulcer Methods: Explain/Verbal Responses: State content correctly Electronic Signature(s) Signed: 03/25/2020 4:35:13 PM By: Grover Canavan Entered  By: Grover Canavan on 03/25/2020 11:41:56 Stegenga, [name] ([id number]) -------------------------------------------------------------------------------- Vitals Details Patient Name:  [name]. Date of Service: 03/25/2020 10:45 AM Medical Record Number: [id number] Patient Account Number: [id number] Date of Birth/Sex: Jun 03, 1956 (64 y.o. F) Treating RN: Grover Canavan Primary Care Steffan Caniglia: Tandy Gaw Other Clinician: Referring Alitzel Cookson: Tandy Gaw Treating Samaya Boardley/Extender: STONE III, HOYT Weeks in Treatment: 17 Vital Signs Time Taken: 10:55 Temperature (F): 98.2 Height (in): 63 Pulse (bpm): 65 Weight (lbs): 370 Respiratory Rate (breaths/min): 18 Body Mass Index (BMI): 65.5 Blood Pressure (mmHg): 159/88 Reference Range: 80 - 120 mg / dl Electronic Signature(s) Signed: 03/25/2020 11:44:32 AM By: Darci Needle Entered By: Darci Needle on 03/25/2020 11:20:37

## 2020-03-28 ENCOUNTER — Ambulatory Visit: Payer: Self-pay | Admitting: Internal Medicine

## 2020-03-28 LAB — AEROBIC CULTURE W GRAM STAIN (SUPERFICIAL SPECIMEN)

## 2020-04-15 ENCOUNTER — Inpatient Hospital Stay
Admission: EM | Admit: 2020-04-15 | Discharge: 2020-04-17 | DRG: 603 | Disposition: A | Discharge status: Home / Self Care | Payer: Medicaid Other | Source: Ambulatory Visit | Attending: Internal Medicine | Admitting: Internal Medicine

## 2020-04-15 ENCOUNTER — Encounter: Payer: Medicaid Other | Attending: Physician Assistant | Admitting: Physician Assistant

## 2020-04-15 ENCOUNTER — Encounter: Payer: Self-pay | Admitting: Emergency Medicine

## 2020-04-15 ENCOUNTER — Emergency Department: Payer: Medicaid Other

## 2020-04-15 ENCOUNTER — Other Ambulatory Visit: Payer: Self-pay

## 2020-04-15 DIAGNOSIS — I890 Lymphedema, not elsewhere classified: Secondary | ICD-10-CM | POA: Diagnosis not present

## 2020-04-15 DIAGNOSIS — L03116 Cellulitis of left lower limb: Secondary | ICD-10-CM | POA: Diagnosis present

## 2020-04-15 DIAGNOSIS — Z79899 Other long term (current) drug therapy: Secondary | ICD-10-CM | POA: Diagnosis not present

## 2020-04-15 DIAGNOSIS — Z881 Allergy status to other antibiotic agents status: Secondary | ICD-10-CM | POA: Insufficient documentation

## 2020-04-15 DIAGNOSIS — L03115 Cellulitis of right lower limb: Secondary | ICD-10-CM | POA: Insufficient documentation

## 2020-04-15 DIAGNOSIS — Z20822 Contact with and (suspected) exposure to covid-19: Secondary | ICD-10-CM | POA: Diagnosis present

## 2020-04-15 DIAGNOSIS — J45909 Unspecified asthma, uncomplicated: Secondary | ICD-10-CM | POA: Diagnosis present

## 2020-04-15 DIAGNOSIS — E11622 Type 2 diabetes mellitus with other skin ulcer: Secondary | ICD-10-CM | POA: Insufficient documentation

## 2020-04-15 DIAGNOSIS — L03119 Cellulitis of unspecified part of limb: Secondary | ICD-10-CM

## 2020-04-15 DIAGNOSIS — L97821 Non-pressure chronic ulcer of other part of left lower leg limited to breakdown of skin: Secondary | ICD-10-CM | POA: Insufficient documentation

## 2020-04-15 DIAGNOSIS — L97811 Non-pressure chronic ulcer of other part of right lower leg limited to breakdown of skin: Secondary | ICD-10-CM | POA: Insufficient documentation

## 2020-04-15 DIAGNOSIS — I10 Essential (primary) hypertension: Secondary | ICD-10-CM | POA: Insufficient documentation

## 2020-04-15 DIAGNOSIS — G4733 Obstructive sleep apnea (adult) (pediatric): Secondary | ICD-10-CM | POA: Diagnosis not present

## 2020-04-15 DIAGNOSIS — Q820 Hereditary lymphedema: Secondary | ICD-10-CM | POA: Insufficient documentation

## 2020-04-15 DIAGNOSIS — F17290 Nicotine dependence, other tobacco product, uncomplicated: Secondary | ICD-10-CM | POA: Diagnosis present

## 2020-04-15 DIAGNOSIS — Z886 Allergy status to analgesic agent status: Secondary | ICD-10-CM | POA: Insufficient documentation

## 2020-04-15 LAB — COMPREHENSIVE METABOLIC PANEL
ALT: 13 U/L (ref 0–44)
AST: 17 U/L (ref 15–41)
Albumin: 3.3 g/dL — ABNORMAL LOW (ref 3.5–5.0)
Alkaline Phosphatase: 110 U/L (ref 38–126)
Anion gap: 10 (ref 5–15)
BUN: 15 mg/dL (ref 8–23)
CO2: 25 mmol/L (ref 22–32)
Calcium: 8.4 mg/dL — ABNORMAL LOW (ref 8.9–10.3)
Chloride: 101 mmol/L (ref 98–111)
Creatinine, Ser: 0.99 mg/dL (ref 0.44–1.00)
GFR, Estimated: >60 mL/min (ref >60)
Glucose, Bld: 97 mg/dL (ref 70–99)
Potassium: 3.8 mmol/L (ref 3.5–5.1)
Sodium: 136 mmol/L (ref 135–145)
Total Bilirubin: 0.8 mg/dL (ref 0.3–1.2)
Total Protein: 8.5 g/dL — ABNORMAL HIGH (ref 6.5–8.1)

## 2020-04-15 LAB — CBC WITH DIFFERENTIAL/PLATELET
Abs Immature Granulocytes: 0.02 10*3/uL (ref 0.00–0.07)
Basophils Absolute: 0 10*3/uL (ref 0.0–0.1)
Basophils Relative: 0 %
Eosinophils Absolute: 0 10*3/uL (ref 0.0–0.5)
Eosinophils Relative: 0 %
HCT: 36.1 % (ref 36.0–46.0)
Hemoglobin: 11.3 g/dL — ABNORMAL LOW (ref 12.0–15.0)
Immature Granulocytes: 0 %
Lymphocytes Relative: 26 %
Lymphs Abs: 1.7 10*3/uL (ref 0.7–4.0)
MCH: 25.6 pg — ABNORMAL LOW (ref 26.0–34.0)
MCHC: 31.3 g/dL (ref 30.0–36.0)
MCV: 81.7 fL (ref 80.0–100.0)
Monocytes Absolute: 0.7 10*3/uL (ref 0.1–1.0)
Monocytes Relative: 11 %
Neutro Abs: 4.1 10*3/uL (ref 1.7–7.7)
Neutrophils Relative %: 63 %
Platelets: 196 10*3/uL (ref 150–400)
RBC: 4.42 MIL/uL (ref 3.87–5.11)
RDW: 14.9 % (ref 11.5–15.5)
WBC: 6.5 10*3/uL (ref 4.0–10.5)
nRBC: 0 % (ref 0.0–0.2)

## 2020-04-15 LAB — BRAIN NATRIURETIC PEPTIDE: B Natriuretic Peptide: 77.1 pg/mL (ref 0.0–100.0)

## 2020-04-15 LAB — CBC
HCT: 39.5 % (ref 36.0–46.0)
Hemoglobin: 12.3 g/dL (ref 12.0–15.0)
MCH: 26.1 pg (ref 26.0–34.0)
MCHC: 31.1 g/dL (ref 30.0–36.0)
MCV: 83.9 fL (ref 80.0–100.0)
Platelets: 186 10*3/uL (ref 150–400)
RBC: 4.71 MIL/uL (ref 3.87–5.11)
RDW: 15 % (ref 11.5–15.5)
WBC: 6.2 10*3/uL (ref 4.0–10.5)
nRBC: 0 % (ref 0.0–0.2)

## 2020-04-15 LAB — TROPONIN I (HIGH SENSITIVITY): Troponin I (High Sensitivity): 4 ng/L (ref <18)

## 2020-04-15 LAB — CREATININE, SERUM
Creatinine, Ser: 1.01 mg/dL — ABNORMAL HIGH (ref 0.44–1.00)
GFR, Estimated: 59 mL/min — ABNORMAL LOW (ref >60)

## 2020-04-15 LAB — RESPIRATORY PANEL BY RT PCR (FLU A&B, COVID)
Influenza A by PCR: NEGATIVE
Influenza B by PCR: NEGATIVE
SARS Coronavirus 2 by RT PCR: NEGATIVE

## 2020-04-15 MED ORDER — ENOXAPARIN SODIUM 40 MG/0.4ML ~~LOC~~ SOLN
40.0000 mg | Freq: Two times a day (BID) | SUBCUTANEOUS | Status: DC
  Administered 2020-04-15 – 2020-04-17 (×4): 40 mg via SUBCUTANEOUS
  Filled 2020-04-15 (×4): qty 0.4

## 2020-04-15 MED ORDER — FUROSEMIDE 20 MG PO TABS
20.0000 mg | ORAL_TABLET | Freq: Every day | ORAL | Status: DC
  Administered 2020-04-16 – 2020-04-17 (×2): 20 mg via ORAL
  Filled 2020-04-15 (×2): qty 1

## 2020-04-15 MED ORDER — CLINDAMYCIN PHOSPHATE 600 MG/50ML IV SOLN
600.0000 mg | Freq: Once | INTRAVENOUS | Status: AC
  Administered 2020-04-15: 600 mg via INTRAVENOUS
  Filled 2020-04-15: qty 50

## 2020-04-15 MED ORDER — INFLUENZA VAC SPLIT QUAD 0.5 ML IM SUSY: 0.5000 mL | PREFILLED_SYRINGE | INTRAMUSCULAR | Status: DC

## 2020-04-15 MED ORDER — PNEUMOCOCCAL VAC POLYVALENT 25 MCG/0.5ML IJ INJ: 0.5000 mL | INJECTION | INTRAMUSCULAR | Status: DC

## 2020-04-15 MED ORDER — ATENOLOL 25 MG PO TABS
25.0000 mg | ORAL_TABLET | Freq: Every day | ORAL | Status: DC
  Administered 2020-04-15 – 2020-04-17 (×2): 25 mg via ORAL
  Filled 2020-04-15 (×3): qty 1

## 2020-04-15 MED ORDER — DOXYCYCLINE HYCLATE 100 MG PO TABS
100.0000 mg | ORAL_TABLET | Freq: Two times a day (BID) | ORAL | Status: DC
  Administered 2020-04-15 – 2020-04-17 (×4): 100 mg via ORAL
  Filled 2020-04-15 (×4): qty 1

## 2020-04-15 MED ORDER — ACETAMINOPHEN 325 MG PO TABS
650.0000 mg | ORAL_TABLET | Freq: Four times a day (QID) | ORAL | Status: DC | PRN
  Administered 2020-04-15: 650 mg via ORAL
  Filled 2020-04-15: qty 2

## 2020-04-15 MED ORDER — RISAQUAD PO CAPS
1.0000 | ORAL_CAPSULE | Freq: Every day | ORAL | Status: DC
  Administered 2020-04-16 – 2020-04-17 (×2): 1 via ORAL
  Filled 2020-04-15 (×2): qty 1

## 2020-04-15 MED ORDER — FUROSEMIDE 10 MG/ML IJ SOLN
60.0000 mg | Freq: Once | INTRAMUSCULAR | Status: AC
  Administered 2020-04-15: 60 mg via INTRAVENOUS
  Filled 2020-04-15: qty 8

## 2020-04-15 NOTE — ED Notes (Signed)
Lab at bedside

## 2020-04-15 NOTE — ED Notes (Signed)
Missed 2xtimes for IV attempt. Called lab for blood draw.

## 2020-04-15 NOTE — ED Triage Notes (Signed)
Pt to ED via POV stating that she had appt this morning with the wound care center and they wanted her to be evaluated in the ED for possible cellulitis in the Rt leg. Pt is in NAD.

## 2020-04-15 NOTE — H&P (Signed)
Patient came from home, at the baseline she walks with a walker with some difficulty.  Chief Complaint: Bilateral lower extremity swelling and redness. HPI:  [name] is a 64 year old female with a history of morbid obesity, chronic bilateral lower extremity lymphedema, asthma, obstructive sleep apnea on CPAP, essential hypertension who present to the emergency room with bilateral lower extremity redness and swelling. Patient has chronic lymphedema, she has been followed by wound clinic.  For the last 3 weeks, her leg swelling was worse.  She has significant right leg swelling and redness up to the right thigh.  She was given antibiotics for 10 days with Keflex without effect.  Then her bilateral distal lower extremity started have significant draining and redness.  She also complains significant pain in her legs.  Her condition has not been getting better since taking antibiotics, she came to the emergency room today for further treatment. Patient denies any fever or chills, he is not having nausea vomiting.  She had a few pounds of weight gain recently. In the emergency room, she had bilateral lower extremity duplex ultrasound, did not see any DVT.  She is afebrile, she does not have any tachycardia, white cell count 6.5, troponin 4.  She was giving IV Lasix, and doxycycline.    Past Medical History:  Diagnosis Date  . Arthritis   . Asthma   . Enlarged heart   . Hypertension   . Lymphedema   . Sleep apnea   . Spinal stenosis     Past Surgical History:  Procedure Laterality Date  . COLONOSCOPY WITH PROPOFOL N/A 03/12/2018   Procedure: COLONOSCOPY WITH PROPOFOL;  Surgeon: Manya Silvas, MD;  Location: [facility];  Service: Endoscopy;  Laterality: N/A;  . NO PAST SURGERIES      Family History  Problem Relation Age of Onset  . Thyroid disease Other   . Breast cancer Maternal Aunt    Social History:  reports that she quit smoking about 29 years ago. She started smoking  about 43 years ago. She smoked 0.25 packs per day. Her smokeless tobacco use includes snuff. She reports that she does not drink alcohol and does not use drugs.  Allergies:  Allergies  Allergen Reactions  . Vancomycin Itching and Nausea And Vomiting  . Ace Inhibitors Hives and Swelling  . Ibuprofen Hives and Swelling    (Not in a hospital admission)   Results for orders placed or performed during the hospital encounter of 04/15/20 (from the past 48 hour(s))  CBC with Differential     Status: Abnormal   Collection Time: 04/15/20 12:45 PM  Result Value Ref Range   WBC 6.5 4.0 - 10.5 K/uL   RBC 4.42 3.87 - 5.11 MIL/uL   Hemoglobin 11.3 (L) 12.0 - 15.0 g/dL   HCT 36.1 36 - 46 %   MCV 81.7 80.0 - 100.0 fL   MCH 25.6 (L) 26.0 - 34.0 pg   MCHC 31.3 30.0 - 36.0 g/dL   RDW 14.9 11.5 - 15.5 %   Platelets 196 150 - 400 K/uL   nRBC 0.0 0.0 - 0.2 %   Neutrophils Relative % 63 %   Neutro Abs 4.1 1.7 - 7.7 K/uL   Lymphocytes Relative 26 %   Lymphs Abs 1.7 0.7 - 4.0 K/uL   Monocytes Relative 11 %   Monocytes Absolute 0.7 0.1 - 1.0 K/uL   Eosinophils Relative 0 %   Eosinophils Absolute 0.0 0 - 0 K/uL   Basophils Relative 0 %  Basophils Absolute 0.0 0 - 0 K/uL   Immature Granulocytes 0 %   Abs Immature Granulocytes 0.02 0.00 - 0.07 K/uL    Comment: Performed at [facility], [address] [place], [place] [address]  Comprehensive metabolic panel     Status: Abnormal   Collection Time: 04/15/20 12:45 PM  Result Value Ref Range   Sodium 136 135 - 145 mmol/L   Potassium 3.8 3.5 - 5.1 mmol/L   Chloride 101 98 - 111 mmol/L   CO2 25 22 - 32 mmol/L   Glucose, Bld 97 70 - 99 mg/dL    Comment: Glucose reference range applies only to samples taken after fasting for at least 8 hours.   BUN 15 8 - 23 mg/dL   Creatinine, Ser 0.99 0.44 - 1.00 mg/dL   Calcium 8.4 (L) 8.9 - 10.3 mg/dL   Total Protein 8.5 (H) 6.5 - 8.1 g/dL   Albumin 3.3 (L) 3.5 - 5.0 g/dL   AST 17 15 - 41 U/L   ALT  13 0 - 44 U/L   Alkaline Phosphatase 110 38 - 126 U/L   Total Bilirubin 0.8 0.3 - 1.2 mg/dL   GFR, Estimated >60 >60 mL/min   Anion gap 10 5 - 15    Comment: Performed at [facility], [address] [place], [place] [address]  Troponin I (High Sensitivity)     Status: None   Collection Time: 04/15/20 12:45 PM  Result Value Ref Range   Troponin I (High Sensitivity) 4 <18 ng/L    Comment: (NOTE) Elevated high sensitivity troponin I (hsTnI) values and significant  changes across serial measurements may suggest ACS but many other  chronic and acute conditions are known to elevate hsTnI results.  Refer to the "Links" section for chest pain algorithms and additional  guidance. Performed at [facility], [address] [place], [place] [address]    US Venous Img Lower Bilateral  Result Date: 04/15/2020 CLINICAL DATA:  64 year old female with a history of swelling and pain EXAM: BILATERAL LOWER EXTREMITY VENOUS DOPPLER ULTRASOUND TECHNIQUE: Gray-scale sonography with graded compression, as well as color Doppler and duplex ultrasound were performed to evaluate the lower extremity deep venous systems from the level of the common femoral vein and including the common femoral, femoral, profunda femoral, popliteal and calf veins including the posterior tibial, peroneal and gastrocnemius veins when visible. The superficial great saphenous vein was also interrogated. Spectral Doppler was utilized to evaluate flow at rest and with distal augmentation maneuvers in the common femoral, femoral and popliteal veins. COMPARISON:  None. FINDINGS: RIGHT LOWER EXTREMITY Common Femoral Vein: No evidence of thrombus. Normal compressibility, respiratory phasicity and response to augmentation. Saphenofemoral Junction: No evidence of thrombus. Normal compressibility and flow on color Doppler imaging. Profunda Femoral Vein: No evidence of thrombus. Normal compressibility and flow on color Doppler  imaging. Femoral Vein: No evidence of thrombus. Normal compressibility, respiratory phasicity and response to augmentation. Popliteal Vein: No evidence of thrombus. Normal compressibility, respiratory phasicity and response to augmentation. Calf Veins: Tibial veins are not visualized Superficial Great Saphenous Vein: No evidence of thrombus. Normal compressibility and flow on color Doppler imaging. Other Findings:  None. LEFT LOWER EXTREMITY Common Femoral Vein: No evidence of thrombus. Normal compressibility, respiratory phasicity and response to augmentation. Saphenofemoral Junction: No evidence of thrombus. Normal compressibility and flow on color Doppler imaging. Profunda Femoral Vein: No evidence of thrombus. Normal compressibility and flow on color Doppler imaging. Femoral Vein: No evidence of  thrombus. Normal compressibility, respiratory phasicity and response to augmentation. Popliteal Vein: No evidence of thrombus. Normal compressibility, respiratory phasicity and response to augmentation. Calf Veins: Tibial veins are not visualized Superficial Great Saphenous Vein: No evidence of thrombus. Normal compressibility and flow on color Doppler imaging Other Findings:  None. IMPRESSION: Sonographic survey of the bilateral lower extremities negative for DVT Electronically Signed   By: Corrie Mckusick D.O.   On: 04/15/2020 16:12    Review of Systems  Constitutional: Positive for unexpected weight change. Negative for chills, diaphoresis, fatigue and fever.  HENT: Negative for congestion, nosebleeds, postnasal drip and rhinorrhea.   Eyes: Negative for pain, discharge and itching.  Respiratory: Positive for shortness of breath. Negative for cough, choking and wheezing.   Cardiovascular: Positive for leg swelling. Negative for chest pain and palpitations.  Gastrointestinal: Negative for abdominal pain, diarrhea, nausea and vomiting.  Endocrine: Negative for cold intolerance and heat intolerance.   Genitourinary: Negative for difficulty urinating, flank pain and hematuria.  Musculoskeletal: Negative for back pain and joint swelling.  Skin: Negative for pallor and rash.  Neurological: Positive for dizziness and light-headedness. Negative for headaches.  Psychiatric/Behavioral: Negative for agitation and confusion.    Blood pressure 132/78, pulse 63, temperature 98.6 F (37 C), temperature source Oral, resp. rate 18, height 5\' 3"  (1.6 m), weight (!) 167.8 kg, SpO2 98 %. Physical Exam Constitutional:      General: She is not in acute distress.    Appearance: Normal appearance. She is not toxic-appearing or diaphoretic.     Comments: Morbid obese  HENT:     Head: Normocephalic and atraumatic.     Nose: No congestion or rhinorrhea.     Mouth/Throat:     Mouth: Mucous membranes are moist.     Pharynx: Oropharynx is clear. No oropharyngeal exudate.  Eyes:     Extraocular Movements: Extraocular movements intact.     Conjunctiva/sclera: Conjunctivae normal.     Pupils: Pupils are equal, round, and reactive to light.  Cardiovascular:     Rate and Rhythm: Normal rate and regular rhythm.     Heart sounds: No murmur heard.  No gallop.   Pulmonary:     Effort: Pulmonary effort is normal. No respiratory distress.     Breath sounds: No wheezing.  Abdominal:     General: Abdomen is flat. Bowel sounds are normal. There is no distension.     Palpations: Abdomen is soft.     Tenderness: There is no abdominal tenderness.  Musculoskeletal:        General: Swelling and tenderness present.     Cervical back: Normal range of motion and neck supple. No rigidity or tenderness.     Right lower leg: Edema present.     Left lower leg: Edema present.  Lymphadenopathy:     Cervical: No cervical adenopathy.  Skin:    General: Skin is warm and dry.     Coloration: Skin is not jaundiced.     Comments: Right leg redness up to the thigh.  Bilateral distal lower extremity red, with draining.  Tender  to touch.  Neurological:     General: No focal deficit present.     Mental Status: She is alert and oriented to person, place, and time.     Sensory: No sensory deficit.  Psychiatric:        Mood and Affect: Mood normal.        Thought Content: Thought content normal.      Assessment/Plan #1.  Bilateral lower extremity cellulitis, worse on the right side. Secondary to bilateral lower extremity lymphedema. Patient has failed outpatient antibiotic treatment. We will start IV antibiotics with Unasyn, doxycycline to cover MRSA. We will also obtain wound care. Continue oral Lasix to reduce edema.  Check a BMP to rule out volume overload. Bilateral lower extremity duplex ultrasound did not show any DVT. Patient does not have sepsis.  #2. Essential hypertension. Continue home medicine.  3.  Obstructive sleep apnea. Continue CPAP while asleep.  4.  Morbid obesity.   Status is: Inpatient  Remains inpatient appropriate because:Inpatient level of care appropriate due to severity of illness.  Patient failed outpatient treatment with cellulitis, will need IV antibiotics.  She will stay in the hospital just for 2 days.   Dispo: The patient is from: Home              Anticipated d/c is to: Home              Anticipated d/c date is: 2-3 days              Patient currently is not medically stable to d/c.       Sharen Hones, MD 04/15/2020, 5:06 PM

## 2020-04-15 NOTE — ED Notes (Signed)
Gave sister update regarding care  Delorise Shiner [phone]

## 2020-04-15 NOTE — Progress Notes (Addendum)
Elizabeth, [name] ([id number]) Visit Report for 04/15/2020 Chief Complaint Document Details Patient Name: [name], [name]. Date of Service: 04/15/2020 11:00 AM Medical Record Number: [id number] Patient Account Number: [id number] Date of Birth/Sex: July 06, 1956 (64 y.o. F) Treating RN: Grover Canavan Primary Care Provider: [facility], [place] Other Clinician: Referring Provider: Tandy Gaw Treating Provider/Extender: Melburn Hake, Staria Birkhead Weeks in Treatment: 20 Information Obtained from: Patient Chief Complaint Bilateral LE lymphedema Electronic Signature(s) Signed: 04/15/2020 11:26:25 AM By: Worthy Keeler PA-C Entered By: Worthy Keeler on 04/15/2020 11:26:24 [name] ([id number]) -------------------------------------------------------------------------------- HPI Details Patient Name: [name] Date of Service: 04/15/2020 11:00 AM Medical Record Number: [id number] Patient Account Number: [id number] Date of Birth/Sex: 05-27-1956 (64 y.o. F) Treating RN: Grover Canavan Primary Care Provider: [facility], [place] Other Clinician: Referring Provider: Tandy Gaw Treating Provider/Extender: Melburn Hake, Kaydan Wong Weeks in Treatment: 20 History of Present Illness HPI Description: The patient is a 64 year old female with history of hypertension and a long-standing history of bilateral lower extremity lymphedema (first presented on 4/2) . She has had open ulcers in the past which have always responded to compression therapy. She had briefly been to a lymphedema clinic in the past which helped her at the time. this time around she stopped treatment of her lymphedema pumps approximately 2 weeks ago because of some pain in the knees and then noticed the right leg getting worse. She was seen by her PCP who put her on clindamycin 4 times a day 2 days ago. The patient has seen [facility] and Dr. Delana Meyer had seen her last year where a vascular study including venous and arterial duplex studies were  within normal limits. he had recommended compression stockings and lymphedema pumps and the patient has been using this in about 2 weeks ago. She is known to be diabetic but in the past few time she's gone to her primary care doctor her hemoglobin A1c has been normal. 02/11/2015 - after her last visit she took my advice and went to the ER regarding the progressive cellulitis of her right lower extremity and she was admitted between July 17 and 22nd. She received IV antibiotics and then was sent home on a course of steroid-induced and oral antibiotics. She has improved much since then. 02/17/2015 -- she has been doing fine and the weeping of her legs has remarkably gone down. She has no fresh issues. READMISSION 01/15/18 This patient was given this clinic before most recently in 2016 seen by Dr. Con Memos. She has massive bilateral lymphedema and over the last 2 months this had weeping edema out of the left leg. She has compression pumps but her compliance with these has been minimal. She has [facility] they've been using TCA/ABDs/kerlix under an Ace wrap.she has had recent problems with cellulitis. She was apparently seen in the ER and 12/23/17 and given clindamycin. She was then followed by her primary doctor and given doxycycline and Keflex. The pain seems to have settled down. In April 2018 the patient had arterial studies done at [facility]. This showed triphasic waveforms throughout the right leg and mostly triphasic waveforms on the left except for monophasic at the posterior tibial artery distally. She was not felt to have evidence of right lower extremity arterial stenosis or significant problems on the left side. 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 She also had a right lower extremity venous Doppler in January 2018 this was limited by the patient's body habitus and lymphedema. Most of the  proximal veins were not visualized The patient  presents with an area of denuded skin on the anterior medial part of the left calf. There is weeping edema fluid here. 01/22/18; the patient has somewhat better edema control using her compression pumps twice a day and as a result she has much better epithelialization on the left anterior calf area. Only a small open area remains. 01/29/18; the patient has been compliant with her compression pumps. Both the areas on her calf that healed. The remaining area on the left anterior leg is fully epithelialized Readmission: 02/20/2019 upon evaluation today patient presents for reevaluation due to issues that she is having with the bilateral lower extremities. She actually has wounds open on both legs. On the right she has an area in the crease of her leg on the right around the knee region which is actually draining quite a bit and actually has some fungal type appearance to it. She has been on nystatin powder that seems to have helped to some degree. In regard to the left lower extremity this is actually in the lower portion of her leg closer to the ankle and again is continuing to drain as well unfortunately. There does not appear to be any signs of active infection at this time which is good news. No fevers, chills, nausea, vomiting, or diarrhea. She tells me that since she was seen last year she is actually been doing quite well for the most part with regard to her lower extremities. Unfortunately she now is experiencing a little bit more drainage at this time. She is concerned about getting this under control so that it does not get significantly worse. 02/27/2019 on evaluation today patient appears to be doing somewhat better in regard to her bilateral lower extremity wounds. She has been tolerating the dressing changes without complication. Fortunately there is no signs of active infection at this point. No fevers, chills, nausea, vomiting, or diarrhea. She did get her dressing supplies which is  excellent news she was extremely excited to get these. She also got paperwork from [employer] for their financial assistance program where they may be able to help her out in the future if needed with supplies at discounted prices. 03/06/2019 on evaluation today patient appears to be doing a little worse with regard to both areas of weeping on her bilateral lower extremities. This is around the right medial knee and just above the left ankle. With that being said she is unfortunately not doing as well as I would like to see. I feel like she may need to potentially go see someone at the lymphedema clinic as the wraps that she needs or even beyond what we can do here at the wound care center. She really does not have wounds she just has open areas of weeping that are causing some difficulty for her. Subsequently because of this and the moisture I am concerned about the potential for infection I am going to likely give her a prophylactic antibiotic today, Keflex, just to be on the safe side. Nonetheless again there is no obvious signs of active infection at this time. 03/13/2019 on evaluation today patient appears to be doing well with regard to her bilateral lower extremities where she has been weeping compared to even last week's evaluation. I see some areas of new skin growth which is excellent and overall I am very pleased with how things seem to be progressing. No fevers, chills, nausea, vomiting, or diarrhea. MEKIAH, WAHLER ([id number]) 03/20/2019 on evaluation  today patient unfortunately is continuing to have issues with significant edema of the left lower extremity. Her right side seems to be doing much better. Unfortunately her left side is showing increased weeping of the lower portion of her leg. This is quite unfortunate obviously we were hoping to get her into the lymphedema clinic they really do not seem to when I see her how if she is draining. Despite the fact this is really not wound related  but more lymphedema weeping related. Nonetheless I do not know that this can be helpful for her to even go for that appointment since again I am not sure there is much that they would actually do at this point. We may need to try a 4 layer compression wrap as best we can on her leg. She is on the Augmentin currently although I am still concerned about whether or not there could be potentially something going on infection wise I would obtain a culture though I understand is not the best being that is a surface culture I just 1 to make sure I do not seem to be missing anything. 03/27/2019 on evaluation today patient appears to be doing much better in regard to the left lower extremity compared to last week. Last week she had tremendous weeping which I think was subsequent to infection now she seems to be doing much better and very pleased. This is not completely healed but there is a lot of new skin growth and it has dried out quite a bit. Overall I think that we are doing well with how things are moving along at this time. No fevers, chills, nausea, vomiting, or diarrhea. 04/03/2019 evaluation today patient appears to be doing a little worse this week compared to last time I saw her. I think this may be due to the fact that she is having issues with not being able to sleep in her bed at least not until last night. She is therefore been in a lift chair and subsequently has also had issues with not been able to use her pumps since she could not get in bed. With that being said the patient overall seems to be doing okay I do think I may want extend the antibiotic for a little bit longer at least until we can see if her edema and her weeping gets better and if it is then obviously I can always discontinue the antibiotics as of next week however I want her to continue to have it over the next week. 04/10/2019 on evaluation today patient unfortunately is still doing poorly with regard to her left lower  extremity. Her right is all things considering doing fairly well. On the left however she continues to have spreading of the area of infection and weeping which appears to be even a larger surface area than noted last week. She did have a positive culture for Pseudomonas in particular which seems to have been of concern she still has green/yellow discharge consistent with Pseudomonas and subsequently a tremendous amount of it. This has me obviously still concerned about the infection not really clearing up despite the fact that on culture it appears the Cipro should have been a good option for treating this. I think she may at this point need IV antibiotics since things are not doing better I do not want to get worse and cause sepsis. She is in agreement with the plan and believes as well that she likely does need to go to the hospital for  IV vancomycin. Or something of the like depending on what the recommendation is from the ER. 04/17/2019 on evaluation today patient appears to be doing excellent in regard to her lower extremity on the left. She was in the hospital for several days from when I sent her last we saw her until just this past Tuesday. Fortunately her drainage is significantly improved and in fact is mostly clear. There is just a couple small areas that may still drain a little bit she states that the [facility] they prescribed for her at discharge she went picked up from pharmacy and got home but has not been able to find it since. She is looked everywhere. She is wondering if I will replace that for her today I will be more than happy to do that. 05/01/2019 on evaluation today patient actually appears to be doing quite well with regard to her lower extremities. She occasionally is having areas that will leak and then heal up mainly when a piece of the fibrotic skin pops off but fortunately she is not having any signs of active infection at this time. Overall she also really does not have  any obvious weeping at this time. I do believe however she really needs some compression wraps and I think this may be a good time to get her back to the lymphedema clinic. 05/11/2019 on evaluation today patient actually appears to be doing quite well with regard to her bilateral lower extremities. She occasionally will have a small area that we per another but in general seems to be completely healed which is great news. Overall very pleased with how everything seems to be progressing. She does have her appointment with lymphedema clinic on November 18. 05/25/2019 on evaluation today patient appears to be doing well with regard to her left lower extremity. I am very pleased in this regard. In regard to her right leg this actually did start draining more I think it is mainly due to the fact that her leg is more swollen. I am not seeing any obvious signs of infection at this time although that is definitely something were obviously acutely aware of simply due to the fact that she had an issue not too far back with exactly this issue. Nonetheless I do feel like that lymphedema clinic would still be beneficial for her. I explained obviously if they are not able to do anything treatment wise on the right leg we could at least have them treat her left leg and then proceed from there. The patient is really in agreement with that plan. If they are able to do both as the drainage slows down that I would be happy to let them handle both. 06/01/2019 on evaluation today patient unfortunately appears to be doing worse with regard to her right lower extremity. The left lower extremity is still maintaining at this point. Unfortunately she has been having significantly increased pain over the past several days and has been experiencing as well increased swelling of the right lower extremity. I really do not know that I am seeing anything that appears to be obvious for infection at this point to be peripherally  honest. With that being said the patient does seem to be having much more swelling that she is even experienced in the past and coupled with increased pain in her hip as well I am concerned that again she could potentially have a DVT although I am not 100% sure of this. I think it something that may need to  be checked out. We discussed the possibility of sending her for a DVT study through the hospital but unfortunately transportation is an issue if she does have a DVT I do not want her to wait days to be able to get in for that test however if she has this scheduled as an outpatient that is as fast that she will be able to get the test scheduled for transportation purposes. That will also fall on Thanksgiving so subsequently she did actually be looking at either Friday or even next week before we would know anything back from this. That is much too long in my opinion. Subsequent to the amount of discomfort she is experiencing the patient is actually okay with going to the ER for evaluation today. 06/12/2019 on evaluation today patient actually appears to be doing significantly better compared to last time I saw her. Following when I last saw her she was actually in the hospital from that Monday until the following Sunday almost 1 full week. She actually was placed on Keflex in the hospital following the time for her to be discharged and Dr. Steva Ready has recommended 2 times a day dosing of the Keflex for the next year in order to help with more prophylactic/preventative measures with regard to her developing cellulitis. Overall I think this sounds like an excellent plan. The patient unfortunately is good to have trouble being treated at lymphedema clinic due to the fact that she really cannot get up on the bed that they have there. They also state that they cannot manage her as long as she has anything draining at this point. Obviously that is somewhat unfortunate as she does need help with edema  control but nonetheless we will have to do what we can for her outside of it sounds like the lymphedema clinic scenario at this point. 06/19/2019 on evaluation today patient appears to be doing fairly well with regard to her bilateral lower extremities. She is not nearly as swollen and shows no signs of infection at this point. There is no evidence of cellulitis whatsoever. She also has no open wounds or draining at this point which is also good news. No fever chills noted. She seems to be in very good spirits and in fact appears to be doing quite well. READMISSION 11/27/2019 RAEGAN, WINDERS ([id number]) This is a 64 year old woman that we have had in this clinic several times before including 2015, 16 and 19 and then most recently from 03/20/2019 through 06/19/2019 with bilateral lower extremity lymphedema. She has had previous arterial and reflux studies done years ago which were not all that remarkable. 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 She does have a positive family history and she had large legs starting may be in her 78s. She was recently in hospital from 10/20/2019 through 10/28/2019 with right leg cellulitis. She was given Ancef and clindamycin and then Zosyn when a culture showed Pseudomonas. At that time there was purulent drainage. She was followed by infectious disease Dr Steva Ready. The patient is now back at home. She has noted increased swelling in the right and no drainage in her right leg mostly on the posterior medial aspect in the calf area. She has not had pain or fever. She has literally been improved lysing above dressings because her at the area of this is far too large for standard compression. She has been wrapping the areas with sheets to resorptive pads. She is found these helped  somewhat. She does have an appointment with the lymphedema clinic in [place] in late June. Past medical history includes bilateral  lymphedema, hypertension, obstructive sleep apnea with CPAP. Recent hospitalization with apparently Pseudomonas cellulitis of the right lower leg 12/15/2019 upon evaluation today patient appears to be doing a little bit worse in regard to her right lower extremity. Unfortunately she is having more weeping down in the lower portion of her leg. Fortunately there is no signs of active infection at this time. No fever chills noted. The patient states she is not having increased pain except for when she attempted to use the lymphedema pumps unfortunately she states that she did have pain when she did this. Otherwise we been using absorptive dressings of one type or another she is using diapers at home and then subsequently Ace wraps. In regard to the barrier cream we have discussed the possibility of derma cloud which she would like to try I do not have a problem with that. 12/22/2019 upon evaluation today patient actually appears to be doing better in regard to her leg ulcers at this point. Fortunately there does not appear to be any signs of active infection which is great news and I am extremely pleased with where things are progressing at this time. There is no sign of active infection currently. The patient is very pleased to see things doing so well. 12/29/2019 upon evaluation today patient appears to be doing a little bit better in regard to her weeping in general over her lower extremities. She does have some signs of mild erythema little bit more than what I noted last week or rather last visit. Nonetheless I think that my threshold for switching her antibiotics from Keflex to something else is very low at this point considering that she has had such severe infections in the past that seem to come almost out of nowhere. There is a little erythema and warmth noted of the lower portion of her leg compared to the upper which also makes me want to go ahead and address things more rapidly at this point.  Likely I would switch out the Keflex for something like Levaquin ideally. 7/16; patient with severe bilateral lymphedema. She has superficial wounds albeit almost circumferential now on the left lateral lower leg. This may be new from last time. Small area on the right anterior lower leg and then another area on the right medial lower leg and of pannus fold. She has been using various absorptive garments. She states she is using her compression pumps once a day occasionally twice. Culture from her last visit here was negative 01/29/2020 on evaluation today patient appears to be doing excellent at this point in regard to her legs with regard to infection I see no signs of active infection at this point. She still does have unfortunately areas of weeping this is minimal on the right now her left is actually significantly worse although I do not think it is as bad as last week with Dr. Dellia Nims saw her. She has been trying to pump and elevate her legs is much as possible. She has previously been on the Keflex and in the past for prevention that seems to do fairly well and likely can extend that today. 02/04/2020 on evaluation today patient appears to be doing better in regard to her legs bilaterally. Fortunately there is no signs of active infection at this time which is great news and overall she has less weeping on the left compared to the  right and there is several spots where she is pretty much sealed up with no draining regions. Overall very pleased in this regard. 02/19/2020 on evaluation today patient appears to be doing very well in regard to her wounds currently. Fortunately there is no evidence of active infection overall very pleased with where things stand. She is significantly improved in regard to her edema I am extremely pleased in this regard she tells me that the popping no longer hurts and in fact she actually looks forward to it. 03/04/2020 on evaluation today patient appears to be doing  excellent in regard to her lower extremities. Fortunately there is no signs of active infection at this time. No fevers, chills, nausea, vomiting, or diarrhea. 03/25/2020 on evaluation today patient appears to be doing a little bit more poorly in regard to her legs at this point. She tells me that she is still continue to have issues with drainage and this has been a little bit worse she was getting ready to start taking the Keflex again but wanted to see me first. Fortunately there is no signs of active infection at this time. No fevers, chills, nausea, vomiting, or diarrhea. 04/15/2020 upon evaluation today patient appears to be doing somewhat poorly in regard to her right leg. She tells me she has been having more pain she has been taking the Keflex that was previously prescribed unfortunately that just does not seem to help with this. She was hoping that the pain on her right was actually coming from the fact that she was having issues with her wrap having gotten caught in her recliner. With that being said she tells me that she knew something was not right. Currently her right leg is warm to touch along with being erythematous all the way up to around at least mid thigh as far as I can see. The left leg does not appear to be doing that badly though there is increased weeping around the ankle region. Electronic Signature(s) Signed: 04/15/2020 11:35:20 AM By: Worthy Keeler PA-C Entered By: Worthy Keeler on 04/15/2020 11:35:20 Gruver, [name] ([id number]) -------------------------------------------------------------------------------- Physical Exam Details Patient Name: ANEA, FODERA C. Date of Service: 04/15/2020 11:00 AM Medical Record Number: [id number] Patient Account Number: [id number] Date of Birth/Sex: 02/04/1956 (64 y.o. F) Treating RN: Grover Canavan Primary Care Provider: [facility], [place] Other Clinician: Referring Provider: Tandy Gaw Treating Provider/Extender: Melburn Hake,  Jahlia Omura Weeks in Treatment: 20 Constitutional Obese and well-hydrated in no acute distress. Respiratory normal breathing without difficulty. Psychiatric this patient is able to make decisions and demonstrates good insight into disease process. Alert and Oriented x 3. pleasant and cooperative. Notes Upon inspection patient has erythema of her right leg as compared to the left this is also warm to touch and unfortunately seems to be infected despite the fact she has been on Keflex this does have me concerned today. Electronic Signature(s) Signed: 04/15/2020 11:42:23 AM By: Worthy Keeler PA-C Entered By: Worthy Keeler on 04/15/2020 11:42:22 [name] ([id number]) -------------------------------------------------------------------------------- Physician Orders Details Patient Name: [name] Date of Service: 04/15/2020 11:00 AM Medical Record Number: [id number] Patient Account Number: [id number] Date of Birth/Sex: 1955-09-12 (64 y.o. 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  L97.673 Non-pressure chronic ulcer of other part of left lower leg limited to breakdown of skin Wound Cleansing o Dial antibacterial soap, wash wounds, rinse and pat dry prior to dressing wounds Secondary Dressing o Other - chux, kerlex, ace wraps bilateral legs Notes Go to the Emergency Department for assessment for IV antibiotics Electronic Signature(s) Signed: 04/15/2020 4:37:12 PM By: Grover Canavan Signed: 04/15/2020 4:45:56 PM By: Worthy Keeler PA-C Entered By: Grover Canavan on 04/15/2020 11:33:04 Mulvihill, [name]  ([id number]) -------------------------------------------------------------------------------- Problem List Details Patient Name: [name] Date of Service: 04/15/2020 11:00 AM Medical Record Number: [id number] Patient Account Number: [id number] Date of Birth/Sex: 06/08/1956 (64 y.o. 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 Date of Service: 04/15/2020 11:00 AM Medical Record Number: [id number] Patient Account Number: [id number] Date of Birth/Sex: 1956/05/06 (64 y.o. F) Treating RN: Grover Canavan Primary Care Provider: [facility], [place] Other Clinician: Referring Provider: Tandy Gaw Treating Provider/Extender: Melburn Hake, Takiyah Bohnsack Weeks in Treatment: 20 Subjective Chief Complaint Information obtained from Patient Bilateral LE lymphedema History of Present Illness (HPI) The patient is a 65 year old female with history of hypertension and a long-standing history of bilateral lower extremity lymphedema (first presented on 4/2) . She has had open ulcers in the past which have always responded to compression therapy. She had briefly been to a lymphedema clinic  in the past which helped her at the time. this time around she stopped treatment of her lymphedema pumps approximately 2 weeks ago because of some pain in the knees and then noticed the right leg getting worse. She was seen by her PCP who put her on clindamycin 4 times a day 2 days ago. The patient has seen [facility] and Dr. Delana Meyer had seen her last year where a vascular study including venous and arterial duplex studies were within normal limits. he had recommended compression stockings and lymphedema pumps and the patient has been using this in about 2 weeks ago. She is known to be diabetic but in the past few time she's gone to her primary care doctor her hemoglobin A1c has been normal. 02/11/2015 - after her last visit she took my advice and went to the ER regarding the progressive cellulitis of her right lower extremity and she was admitted between July 17 and 22nd. She received IV antibiotics and then was sent home on a course of steroid-induced and oral antibiotics. She has improved much since then. 02/17/2015 -- she has been doing fine and the weeping of her legs has remarkably gone down. She has no fresh issues. READMISSION 01/15/18 This patient was given this clinic before most recently in 2016 seen by Dr. Con Memos. She has massive bilateral lymphedema and over the last 2 months this had weeping edema out of the left leg. She has compression pumps but her compliance with these has been minimal. She has [facility] they've been using TCA/ABDs/kerlix under  an Ace wrap.she has had recent problems with cellulitis. She was apparently seen in the ER and 12/23/17 and given clindamycin. She was then followed by her primary doctor and given doxycycline and Keflex. The pain seems to have settled down. In April 2018 the patient had arterial studies done at [facility]. This showed triphasic waveforms throughout the right leg and mostly triphasic waveforms on the left except for  monophasic at the posterior tibial artery distally. She was not felt to have evidence of right lower extremity arterial stenosis or significant problems on the left side. 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 She also had a right lower extremity venous Doppler in January 2018 this was limited by the patient's body habitus and lymphedema. Most of the proximal veins were not visualized The patient presents with an area of denuded skin on the anterior medial part of the left calf. There is weeping edema fluid here. 01/22/18; the patient has somewhat better edema control using her compression pumps twice a day and as a result she has much better epithelialization on the left anterior calf area. Only a small open area remains. 01/29/18; the patient has been compliant with her compression pumps. Both the areas on her calf that healed. The remaining area on the left anterior leg is fully epithelialized Readmission: 02/20/2019 upon evaluation today patient presents for reevaluation due to issues that she is having with the bilateral lower extremities. She actually has wounds open on both legs. On the right she has an area in the crease of her leg on the right around the knee region which is actually draining quite a bit and actually has some fungal type appearance to it. She has been on nystatin powder that seems to have helped to some degree. In regard to the left lower extremity this is actually in the lower portion of her leg closer to the ankle and again is continuing to drain as well unfortunately. There does not appear to be any signs of active infection at this time which is good news. No fevers, chills, nausea, vomiting, or diarrhea. She tells me that since she was seen last year she is actually been doing quite well for the most part with regard to her lower extremities. Unfortunately she now is experiencing a little bit more drainage at this time. She is concerned about  getting this under control so that it does not get significantly worse. 02/27/2019 on evaluation today patient appears to be doing somewhat better in regard to her bilateral lower extremity wounds. She has been tolerating the dressing changes without complication. Fortunately there is no signs of active infection at this point. No fevers, chills, nausea, vomiting, or diarrhea. She did get her dressing supplies which is excellent news she was extremely excited to get these. She also got paperwork from [employer] for their financial assistance program where they may be able to help her out in the future if needed with supplies at discounted prices. 03/06/2019 on evaluation today patient appears to be doing a little worse with regard to both areas of weeping on her bilateral lower extremities. This is around the right medial knee and just above the left ankle. With that being said she is unfortunately not doing as well as I would like to see. I feel like she may need to potentially go see someone at the lymphedema clinic as the wraps that she needs or even beyond what we can do here at the wound  care center. She really does not have wounds she just has open areas of weeping that are causing some difficulty for her. Subsequently because of this and the moisture I am concerned about the potential for infection I am going to likely give her a prophylactic antibiotic today, Keflex, just to be on the safe side. Nonetheless again there is no obvious signs of active infection at this time. JASANI, LENGEL ([id number]) 03/13/2019 on evaluation today patient appears to be doing well with regard to her bilateral lower extremities where she has been weeping compared to even last week's evaluation. I see some areas of new skin growth which is excellent and overall I am very pleased with how things seem to be progressing. No fevers, chills, nausea, vomiting, or diarrhea. 03/20/2019 on evaluation today patient unfortunately  is continuing to have issues with significant edema of the left lower extremity. Her right side seems to be doing much better. Unfortunately her left side is showing increased weeping of the lower portion of her leg. This is quite unfortunate obviously we were hoping to get her into the lymphedema clinic they really do not seem to when I see her how if she is draining. Despite the fact this is really not wound related but more lymphedema weeping related. Nonetheless I do not know that this can be helpful for her to even go for that appointment since again I am not sure there is much that they would actually do at this point. We may need to try a 4 layer compression wrap as best we can on her leg. She is on the Augmentin currently although I am still concerned about whether or not there could be potentially something going on infection wise I would obtain a culture though I understand is not the best being that is a surface culture I just 1 to make sure I do not seem to be missing anything. 03/27/2019 on evaluation today patient appears to be doing much better in regard to the left lower extremity compared to last week. Last week she had tremendous weeping which I think was subsequent to infection now she seems to be doing much better and very pleased. This is not completely healed but there is a lot of new skin growth and it has dried out quite a bit. Overall I think that we are doing well with how things are moving along at this time. No fevers, chills, nausea, vomiting, or diarrhea. 04/03/2019 evaluation today patient appears to be doing a little worse this week compared to last time I saw her. I think this may be due to the fact that she is having issues with not being able to sleep in her bed at least not until last night. She is therefore been in a lift chair and subsequently has also had issues with not been able to use her pumps since she could not get in bed. With that being said the patient  overall seems to be doing okay I do think I may want extend the antibiotic for a little bit longer at least until we can see if her edema and her weeping gets better and if it is then obviously I can always discontinue the antibiotics as of next week however I want her to continue to have it over the next week. 04/10/2019 on evaluation today patient unfortunately is still doing poorly with regard to her left lower extremity. Her right is all things considering doing fairly well. On the left however she continues  to have spreading of the area of infection and weeping which appears to be even a larger surface area than noted last week. She did have a positive culture for Pseudomonas in particular which seems to have been of concern she still has green/yellow discharge consistent with Pseudomonas and subsequently a tremendous amount of it. This has me obviously still concerned about the infection not really clearing up despite the fact that on culture it appears the Cipro should have been a good option for treating this. I think she may at this point need IV antibiotics since things are not doing better I do not want to get worse and cause sepsis. She is in agreement with the plan and believes as well that she likely does need to go to the hospital for IV vancomycin. Or something of the like depending on what the recommendation is from the ER. 04/17/2019 on evaluation today patient appears to be doing excellent in regard to her lower extremity on the left. She was in the hospital for several days from when I sent her last we saw her until just this past Tuesday. Fortunately her drainage is significantly improved and in fact is mostly clear. There is just a couple small areas that may still drain a little bit she states that the [facility] they prescribed for her at discharge she went picked up from pharmacy and got home but has not been able to find it since. She is looked everywhere. She is wondering if I  will replace that for her today I will be more than happy to do that. 05/01/2019 on evaluation today patient actually appears to be doing quite well with regard to her lower extremities. She occasionally is having areas that will leak and then heal up mainly when a piece of the fibrotic skin pops off but fortunately she is not having any signs of active infection at this time. Overall she also really does not have any obvious weeping at this time. I do believe however she really needs some compression wraps and I think this may be a good time to get her back to the lymphedema clinic. 05/11/2019 on evaluation today patient actually appears to be doing quite well with regard to her bilateral lower extremities. She occasionally will have a small area that we per another but in general seems to be completely healed which is great news. Overall very pleased with how everything seems to be progressing. She does have her appointment with lymphedema clinic on November 18. 05/25/2019 on evaluation today patient appears to be doing well with regard to her left lower extremity. I am very pleased in this regard. In regard to her right leg this actually did start draining more I think it is mainly due to the fact that her leg is more swollen. I am not seeing any obvious signs of infection at this time although that is definitely something were obviously acutely aware of simply due to the fact that she had an issue not too far back with exactly this issue. Nonetheless I do feel like that lymphedema clinic would still be beneficial for her. I explained obviously if they are not able to do anything treatment wise on the right leg we could at least have them treat her left leg and then proceed from there. The patient is really in agreement with that plan. If they are able to do both as the drainage slows down that I would be happy to let them handle both. 06/01/2019 on evaluation  today patient unfortunately appears to  be doing worse with regard to her right lower extremity. The left lower extremity is still maintaining at this point. Unfortunately she has been having significantly increased pain over the past several days and has been experiencing as well increased swelling of the right lower extremity. I really do not know that I am seeing anything that appears to be obvious for infection at this point to be peripherally honest. With that being said the patient does seem to be having much more swelling that she is even experienced in the past and coupled with increased pain in her hip as well I am concerned that again she could potentially have a DVT although I am not 100% sure of this. I think it something that may need to be checked out. We discussed the possibility of sending her for a DVT study through the hospital but unfortunately transportation is an issue if she does have a DVT I do not want her to wait days to be able to get in for that test however if she has this scheduled as an outpatient that is as fast that she will be able to get the test scheduled for transportation purposes. That will also fall on Thanksgiving so subsequently she did actually be looking at either Friday or even next week before we would know anything back from this. That is much too long in my opinion. Subsequent to the amount of discomfort she is experiencing the patient is actually okay with going to the ER for evaluation today. 06/12/2019 on evaluation today patient actually appears to be doing significantly better compared to last time I saw her. Following when I last saw her she was actually in the hospital from that Monday until the following Sunday almost 1 full week. She actually was placed on Keflex in the hospital following the time for her to be discharged and Dr. Steva Ready has recommended 2 times a day dosing of the Keflex for the next year in order to help with more prophylactic/preventative measures with regard to her  developing cellulitis. Overall I think this sounds like an excellent plan. The patient unfortunately is good to have trouble being treated at lymphedema clinic due to the fact that she really cannot get up on the bed that they have there. They also state that they cannot manage her as long as she has anything draining at this point. Obviously that is somewhat unfortunate as she does need help with edema control but nonetheless we will have to do what we can for her outside of it sounds like the lymphedema clinic scenario at this point. 06/19/2019 on evaluation today patient appears to be doing fairly well with regard to her bilateral lower extremities. She is not nearly as swollen Kluever, Stuti C. ([id number]) and shows no signs of infection at this point. There is no evidence of cellulitis whatsoever. She also has no open wounds or draining at this point which is also good news. No fever chills noted. She seems to be in very good spirits and in fact appears to be doing quite well. READMISSION 11/27/2019 This is a 64 year old woman that we have had in this clinic several times before including 2015, 16 and 19 and then most recently from 03/20/2019 through 06/19/2019 with bilateral lower extremity lymphedema. She has had previous arterial and reflux studies done years ago which were not all that remarkable. 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 She does  have a positive family history and she had large legs starting may be in her 50s. She was recently in hospital from 10/20/2019 through 10/28/2019 with right leg cellulitis. She was given Ancef and clindamycin and then Zosyn when a culture showed Pseudomonas. At that time there was purulent drainage. She was followed by infectious disease Dr Steva Ready. The patient is now back at home. She has noted increased swelling in the right and no drainage in her right leg mostly on the posterior  medial aspect in the calf area. She has not had pain or fever. She has literally been improved lysing above dressings because her at the area of this is far too large for standard compression. She has been wrapping the areas with sheets to resorptive pads. She is found these helped somewhat. She does have an appointment with the lymphedema clinic in [place] in late June. Past medical history includes bilateral lymphedema, hypertension, obstructive sleep apnea with CPAP. Recent hospitalization with apparently Pseudomonas cellulitis of the right lower leg 12/15/2019 upon evaluation today patient appears to be doing a little bit worse in regard to her right lower extremity. Unfortunately she is having more weeping down in the lower portion of her leg. Fortunately there is no signs of active infection at this time. No fever chills noted. The patient states she is not having increased pain except for when she attempted to use the lymphedema pumps unfortunately she states that she did have pain when she did this. Otherwise we been using absorptive dressings of one type or another she is using diapers at home and then subsequently Ace wraps. In regard to the barrier cream we have discussed the possibility of derma cloud which she would like to try I do not have a problem with that. 12/22/2019 upon evaluation today patient actually appears to be doing better in regard to her leg ulcers at this point. Fortunately there does not appear to be any signs of active infection which is great news and I am extremely pleased with where things are progressing at this time. There is no sign of active infection currently. The patient is very pleased to see things doing so well. 12/29/2019 upon evaluation today patient appears to be doing a little bit better in regard to her weeping in general over her lower extremities. She does have some signs of mild erythema little bit more than what I noted last week or rather last  visit. Nonetheless I think that my threshold for switching her antibiotics from Keflex to something else is very low at this point considering that she has had such severe infections in the past that seem to come almost out of nowhere. There is a little erythema and warmth noted of the lower portion of her leg compared to the upper which also makes me want to go ahead and address things more rapidly at this point. Likely I would switch out the Keflex for something like Levaquin ideally. 7/16; patient with severe bilateral lymphedema. She has superficial wounds albeit almost circumferential now on the left lateral lower leg. This may be new from last time. Small area on the right anterior lower leg and then another area on the right medial lower leg and of pannus fold. She has been using various absorptive garments. She states she is using her compression pumps once a day occasionally twice. Culture from her last visit here was negative 01/29/2020 on evaluation today patient appears to be doing excellent at this point in regard to her  legs with regard to infection I see no signs of active infection at this point. She still does have unfortunately areas of weeping this is minimal on the right now her left is actually significantly worse although I do not think it is as bad as last week with Dr. Dellia Nims saw her. She has been trying to pump and elevate her legs is much as possible. She has previously been on the Keflex and in the past for prevention that seems to do fairly well and likely can extend that today. 02/04/2020 on evaluation today patient appears to be doing better in regard to her legs bilaterally. Fortunately there is no signs of active infection at this time which is great news and overall she has less weeping on the left compared to the right and there is several spots where she is pretty much sealed up with no draining regions. Overall very pleased in this regard. 02/19/2020 on evaluation  today patient appears to be doing very well in regard to her wounds currently. Fortunately there is no evidence of active infection overall very pleased with where things stand. She is significantly improved in regard to her edema I am extremely pleased in this regard she tells me that the popping no longer hurts and in fact she actually looks forward to it. 03/04/2020 on evaluation today patient appears to be doing excellent in regard to her lower extremities. Fortunately there is no signs of active infection at this time. No fevers, chills, nausea, vomiting, or diarrhea. 03/25/2020 on evaluation today patient appears to be doing a little bit more poorly in regard to her legs at this point. She tells me that she is still continue to have issues with drainage and this has been a little bit worse she was getting ready to start taking the Keflex again but wanted to see me first. Fortunately there is no signs of active infection at this time. No fevers, chills, nausea, vomiting, or diarrhea. 04/15/2020 upon evaluation today patient appears to be doing somewhat poorly in regard to her right leg. She tells me she has been having more pain she has been taking the Keflex that was previously prescribed unfortunately that just does not seem to help with this. She was hoping that the pain on her right was actually coming from the fact that she was having issues with her wrap having gotten caught in her recliner. With that being said she tells me that she knew something was not right. Currently her right leg is warm to touch along with being erythematous all the way up to around at least mid thigh as far as I can see. The left leg does not appear to be doing that badly though there is increased weeping around the ankle region. THERESA, WEDEL ([id number]) Objective Constitutional Obese and well-hydrated in no acute distress. Vitals Time Taken: 11:00 AM, Height: 63 in, Weight: 370 lbs, BMI: 65.5, Temperature:  98.3 F, Pulse: 76 bpm, Respiratory Rate: 20 breaths/min, Blood Pressure: 139/82 mmHg. Respiratory normal breathing without difficulty. Psychiatric this patient is able to make decisions and demonstrates good insight into disease process. Alert and Oriented x 3. pleasant and cooperative. General Notes: Upon inspection patient has erythema of her right leg as compared to the left this is also warm to touch and unfortunately seems to be infected despite the fact she has been on Keflex this does have me concerned today. Other Condition(s) Patient presents with Lymphedema located on the Left Leg. General Notes: increased foul drainage  this visit. Patient presents with Lymphedema located on the Right Leg. General Notes: increased foul drainage. States she caught her upper leg in her recliner. 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 I would recommend currently that we go ahead and initiate a continuation of the wound measures at this point were using Chux pads in order to collect the drainage that worked better than ABD pads were otherwise that we have used in the past. Were also using Kerlix to secure an Ace wraps for the legs bilaterally. 2. Based on what I am seeing currently despite the fact she is been on the Keflex the patient seems to have an infection/cellulitis of her right leg up to at least mid thigh which is as far as I can see. It is warm to touch and does have me concerned about the need for IV antibiotics in the past that the only thing  that is really clear this up when she gets this badly. For that reason I would recommend a follow-up at the ER for further evaluation and treatment. We will see patient back for reevaluation in 3 weeks here in the clinic. If anything worsens or changes patient will contact our office for additional recommendations. Electronic Signature(s) Signed: 04/15/2020 11:43:25 AM By: Worthy Keeler PA-C Entered By: Worthy Keeler on 04/15/2020 11:43:25 Mcmillion, [name] ([id number]) LAIANA, FRATUS ([id number]) -------------------------------------------------------------------------------- SuperBill Details Patient Name: [name] Date of Service: 04/15/2020 Medical Record Number: [id number] Patient Account Number: [id number] Date of Birth/Sex: [birth year] (64 y.o. F) Treating RN: Grover Canavan Primary Care Provider: [facility], [place] Other Clinician: Referring Provider: Tandy Gaw Treating Provider/Extender: Melburn Hake, [place] Coiner Weeks in Treatment: 20 Diagnosis Coding ICD-10 Codes Code Description Q82.0 Hereditary lymphedema L97.811 Non-pressure chronic ulcer of other part of right lower leg limited to breakdown of skin L97.821 Non-pressure chronic ulcer of other part of left lower leg limited to breakdown of skin Facility Procedures CPT4 Code: 41740814 Description: 48185 - WOUND CARE VISIT-LEV 3 EST PT Modifier: Quantity: 1 Physician Procedures CPT4 Code: 6314970 Description: 26378 - WC PHYS LEVEL 4 - EST PT Modifier: Quantity: 1 CPT4 Code: Description: ICD-10 Diagnosis Description Q82.0 Hereditary lymphedema L97.811 Non-pressure chronic ulcer of other part of right lower leg limited to bre L97.821 Non-pressure chronic ulcer of other part of left lower leg limited to brea Modifier: akdown of skin kdown of skin Quantity: Electronic Signature(s) Signed: 04/15/2020 5:20:46 PM By: Gretta Cool, BSN, RN, CWS, Kim RN, BSN Previous Signature: 04/15/2020 11:43:50 AM Version By: Worthy Keeler  PA-C Entered By: Gretta Cool, BSN, RN, CWS, Kim on 04/15/2020 17:20:46

## 2020-04-15 NOTE — ED Notes (Signed)
MD at bedside. 

## 2020-04-15 NOTE — ED Notes (Signed)
Hospital bed requested for patient / Food tray given earlier

## 2020-04-15 NOTE — ED Notes (Signed)
Pharmacy Tech at bedside  

## 2020-04-15 NOTE — ED Notes (Signed)
Report given to [facility] floor Nurse

## 2020-04-15 NOTE — ED Notes (Signed)
ED charge Nurse at bedside. Attempt pt IV

## 2020-04-15 NOTE — ED Provider Notes (Signed)
[facility] Emergency Department Provider Note  Time seen: 3:19 PM  I have reviewed the triage vital signs and the nursing notes.  HISTORY  Chief Complaint Wound Check   HPI [name] is a 64 y.o. female with a past medical history of arthritis, asthma, chronic lymphedema, hypertension, presents to the emergency department for increased discomfort swelling and weeping to her bilateral lower extremities.  According to the patient over the past 1 week she has had increased pain to her lower extremities right greater than left, increased swelling to lower extremities and now with weepage to the lower extremities.  Patient states she started Keflex 2 days ago as a precaution.  Patient follows up with the wound clinic, followed up today and they sent her here for evaluation for possible cellulitis.  Denies any chest pain at this time, no shortness of breath.  Did state 1 week ago she is experiencing mild chest pain but that is since resolved.  Denies any fever.  Largely negative review of systems otherwise.   Past Medical History:  Diagnosis Date  . Arthritis   . Asthma   . Enlarged heart   . Hypertension   . Lymphedema   . Sleep apnea   . Spinal stenosis     Patient Active Problem List   Diagnosis Date Noted  . Anemia   . Cellulitis of left lower extremity 11/03/2018  . Sepsis ([place]) 04/17/2018  . Chest pain with high risk for cardiac etiology 02/04/2017  . Heart palpitations 02/04/2017  . SOB (shortness of breath) on exertion 02/04/2017  . OSA (obstructive sleep apnea) 08/04/2016  . HTN (hypertension) 08/04/2016  . Asthma 08/04/2016  . Screening for colon cancer 05/17/2015  . Cellulitis 01/24/2015  . Cellulitis of right leg 01/23/2015  . Skin rash 12/28/2013  . Tobacco use 12/28/2013  . Spinal stenosis 07/11/2011  . Cataracts, bilateral 05/30/2010  . History of colonic polyps 12/28/2009  . Lymphedema 02/10/2009  . Idiopathic urticaria  02/10/2009  . Morbid obesity ([place]) 02/10/2009  . Other abnormal glucose 02/10/2009  . Essential (primary) hypertension 02/10/2009    Past Surgical History:  Procedure Laterality Date  . COLONOSCOPY WITH PROPOFOL N/A 03/12/2018   Procedure: COLONOSCOPY WITH PROPOFOL;  Surgeon: Manya Silvas, MD;  Location: [facility] ENDOSCOPY;  Service: Endoscopy;  Laterality: N/A;  . NO PAST SURGERIES      Prior to Admission medications   Medication Sig Start Date End Date Taking? Authorizing Provider  acetaminophen (TYLENOL) 500 MG tablet Take 500-1,000 mg by mouth every 6 (six) hours as needed for mild pain or fever.    [provider]  acidophilus (RISAQUAD) CAPS capsule Take 1 capsule by mouth daily. 06/07/19   Loletha Grayer, MD  atenolol (TENORMIN) 25 MG tablet Take 25 mg by mouth daily.    [provider]  cetirizine (ZYRTEC) 10 MG tablet Take 10 mg by mouth daily as needed for allergies.    [provider]  furosemide (LASIX) 20 MG tablet Take 1 tablet (20 mg total) by mouth daily. 06/07/19   Loletha Grayer, MD    Allergies  Allergen Reactions  . Vancomycin Itching and Nausea And Vomiting  . Ace Inhibitors Hives and Swelling  . Ibuprofen Hives and Swelling    Family History  Problem Relation Age of Onset  . Thyroid disease Other   . Breast cancer Maternal Aunt     Social History Social History   Tobacco Use  . Smoking status: Former Smoker  Packs/day: 0.25    Start date: 07/09/1976    Quit date: 07/09/1990    Years since quitting: 29.7  . Smokeless tobacco: Current User    Types: Snuff  Vaping Use  . Vaping Use: Never used  Substance Use Topics  . Alcohol use: No  . Drug use: No    Review of Systems Constitutional: Negative for fever. Cardiovascular: Negative for chest pain. Respiratory: Negative for shortness of breath. Gastrointestinal: Negative for abdominal pain, vomiting  Musculoskeletal: Lymphedema of the lower extremities bilaterally  with increased swelling and weepage recently Skin: Redness of bilateral lower extremities with weepage of bilateral lower extremities Neurological: Negative for headache All other ROS negative  ____________________________________________   PHYSICAL EXAM:  VITAL SIGNS: ED Triage Vitals  Enc Vitals Group     BP 04/15/20 1246 130/71     Pulse Rate 04/15/20 1246 64     Resp 04/15/20 1246 16     Temp 04/15/20 1246 98.6 F (37 C)     Temp Source 04/15/20 1246 Oral     SpO2 04/15/20 1246 96 %     Weight 04/15/20 1243 (!) 370 lb (167.8 kg)     Height 04/15/20 1243 5\' 3"  (1.6 m)     Head Circumference --      Peak Flow --      Pain Score 04/15/20 1243 8     Pain Loc --      Pain Edu? --      Excl. in [employer]? --    Constitutional: Alert and oriented. Well appearing and in no distress. Eyes: Normal exam ENT      Head: Normocephalic and atraumatic.      Mouth/Throat: Mucous membranes are moist. Cardiovascular: Normal rate, regular rhythm.  Respiratory: Normal respiratory effort without tachypnea nor retractions. Breath sounds are clear  Gastrointestinal: Soft and nontender. No distention.  Musculoskeletal: Patient has significant bilateral lymphedema however she states increased from baseline patient has erythema from the mid tibia down bilaterally with weepage of the same area along with tenderness of this area bilaterally with right leg tenderness from the mid thigh distally.  Foot and leg is warm to the touch.  No concern for arterial occlusion. Neurologic:  Normal speech and language. No gross focal neurologic deficits Skin:  Skin is warm.  Erythema with weepage as described above in bilateral lower extremities. Psychiatric: Mood and affect are normal.   ____________________________________________     RADIOLOGY  Ultrasound negative for DVT  ____________________________________________   INITIAL IMPRESSION / ASSESSMENT AND PLAN / ED COURSE  Pertinent labs & imaging results  that were available during my care of the patient were reviewed by me and considered in my medical decision making (see chart for details).   Patient presents emergency department for lower extremity increased edema weepage and redness along with discomfort right greater than left.  History of chronic lymphedema although the patient states it is usually much better controlled than it is currently.  Patient states increased pain over the past several days increased swelling over the past 1 week now with weepage x1 week.  Patient follows up with the wound care center, she saw them today and they referred her to the emergency department for evaluation.  Patient denies any fever.  Reassuringly has a normal white blood cell count, lab work is largely at baseline for the patient.  Given her increased pain and swelling we will obtain bilateral lower extremity venous ultrasound as a precaution.  Patient did state mild  chest pain 1 week ago but not any currently.  We will add on a troponin as a precaution.  Overall the patient appears well, no distress.  Ultrasounds are negative for DVT.  Troponin that was added on is resulted negative.  However given the patient's degree of swelling along with weepage in the erythema with failure of outpatient antibiotics multiple courses over the past 2 months per patient we will admit to the hospital service for IV antibiotics and IV diuresis.  Patient has significant lower extremity edema and swelling and does not believe that she will be able to take Lasix as prescribed at home due to incontinence issues and trouble with mobility due to her lymphedema.  I believe the patient would benefit from IV antibiotics as well.  We will send blood cultures as a precaution and admit to the hospital service.  Avyanna Spada was evaluated in Emergency Department on 04/15/2020 for the symptoms described in the history of present illness. She was evaluated in the context of the global  COVID-19 pandemic, which necessitated consideration that the patient might be at risk for infection with the SARS-CoV-2 virus that causes COVID-19. Institutional protocols and algorithms that pertain to the evaluation of patients at risk for COVID-19 are in a state of rapid change based on information released by regulatory bodies including the CDC and federal and state organizations. These policies and algorithms were followed during the patient's care in the ED.  ____________________________________________   FINAL CLINICAL IMPRESSION(S) / ED DIAGNOSES  Chronic lymphedema Cellulitis   Harvest Dark, MD 04/15/20 1636

## 2020-04-16 LAB — BASIC METABOLIC PANEL
Anion gap: 6 (ref 5–15)
BUN: 15 mg/dL (ref 8–23)
CO2: 29 mmol/L (ref 22–32)
Calcium: 8.3 mg/dL — ABNORMAL LOW (ref 8.9–10.3)
Chloride: 101 mmol/L (ref 98–111)
Creatinine, Ser: 0.94 mg/dL (ref 0.44–1.00)
GFR, Estimated: >60 mL/min (ref >60)
Glucose, Bld: 92 mg/dL (ref 70–99)
Potassium: 3.8 mmol/L (ref 3.5–5.1)
Sodium: 136 mmol/L (ref 135–145)

## 2020-04-16 LAB — CBC
HCT: 32.6 % — ABNORMAL LOW (ref 36.0–46.0)
Hemoglobin: 10.3 g/dL — ABNORMAL LOW (ref 12.0–15.0)
MCH: 26.2 pg (ref 26.0–34.0)
MCHC: 31.6 g/dL (ref 30.0–36.0)
MCV: 83 fL (ref 80.0–100.0)
Platelets: 175 10*3/uL (ref 150–400)
RBC: 3.93 MIL/uL (ref 3.87–5.11)
RDW: 14.8 % (ref 11.5–15.5)
WBC: 5 10*3/uL (ref 4.0–10.5)
nRBC: 0 % (ref 0.0–0.2)

## 2020-04-16 MED ORDER — ACETAMINOPHEN-CODEINE #3 300-30 MG PO TABS
2.0000 | ORAL_TABLET | Freq: Four times a day (QID) | ORAL | Status: DC | PRN
  Administered 2020-04-16 – 2020-04-17 (×4): 2 via ORAL
  Filled 2020-04-16 (×4): qty 2

## 2020-04-16 MED ORDER — SODIUM CHLORIDE 0.9 % IV SOLN
3.0000 g | Freq: Four times a day (QID) | INTRAVENOUS | Status: DC
  Administered 2020-04-16 – 2020-04-17 (×5): 3 g via INTRAVENOUS
  Filled 2020-04-16 (×7): qty 8
  Filled 2020-04-16 (×2): qty 3
  Filled 2020-04-16: qty 8

## 2020-04-16 MED ORDER — SODIUM CHLORIDE 0.9 % IV SOLN
INTRAVENOUS | Status: DC | PRN
  Administered 2020-04-16 – 2020-04-17 (×2): 500 mL via INTRAVENOUS

## 2020-04-16 NOTE — Progress Notes (Signed)
PROGRESS NOTE    [name]  ELF:[id number] DOB: 1955-12-17 DOA: 04/15/2020 PCP: Aretta Nip, [place]   Chief complaint.  Bilateral lower extremity redness. Brief Narrative:  [name] is a 65 year old female with a history of morbid obesity, chronic bilateral lower extremity lymphedema, asthma, obstructive sleep apnea on CPAP, essential hypertension who present to the emergency room with bilateral lower extremity redness and swelling.  Duplex ultrasound ruled out a DVT.  Patient was started on Unasyn and doxycycline for cellulitis.   Assessment & Plan:   Active Problems:   Lymphedema   OSA (obstructive sleep apnea)   HTN (hypertension)   Bilateral lower leg cellulitis  #1.  Bilateral lower extremity cellulitis.  Secondary to bilateral lower extremity lymphedema. Condition is improving on antibiotics. She will benefit from 24 hours additional antibiotics. Most likely discharge home tomorrow with oral antibiotics per  2.  Essential hypertension. Continue home medicines.  3.  Obstructive sleep apnea. CPAP.  4.  Morbid obesity.    DVT prophylaxis: Lovenox Code Status: Full Family Communication: None .   Status is: Inpatient  Remains inpatient appropriate because:Inpatient level of care appropriate due to severity of illness   Dispo: The patient is from: Home              Anticipated d/c is to: Home              Anticipated d/c date is: 1 day              Patient currently is not medically stable to d/c.        I/O last 3 completed shifts: In: 240 [P.O.:240] Out: 2350 [Urine:2350] No intake/output data recorded.     Consultants:   None  Procedures: None  Antimicrobials: Unasyn and doxycycline.  Subjective: Patient feels better today.  Right thigh redness better.  Distal bilateral extremity redness also getting better. No fever or chills. No short of breath or cough. No abdominal pain nausea vomiting or diarrhea. No dysuria or  hematuria.  Objective: Vitals:   04/15/20 2156 04/15/20 2348 04/16/20 0459 04/16/20 0827  BP: 120/63 (!) 99/57 (!) 119/58 107/66  Pulse: 62 66 61 60  Resp: 20 18 19 16   Temp: 98.4 F (36.9 C) 98.7 F (37.1 C) (!) 97.5 F (36.4 C) 98.1 F (36.7 C)  TempSrc: Oral   Oral  SpO2: 95% 96% 99% 98%  Weight:      Height:        Intake/Output Summary (Last 24 hours) at 04/16/2020 1218 Last data filed at 04/16/2020 5852 Gross per 24 hour  Intake 240 ml  Output 2350 ml  Net -2110 ml   Filed Weights   04/15/20 1243  Weight: (!) 167.8 kg    Examination:  General exam: Appears calm and comfortable, morbid obese Respiratory system: Clear to auscultation. Respiratory effort normal. Cardiovascular system: S1 & S2 heard, RRR. No JVD, murmurs, rubs, gallops or clicks. No pedal edema. Gastrointestinal system: Abdomen is nondistended, soft and nontender. No organomegaly or masses felt. Normal bowel sounds heard. Central nervous system: Alert and oriented. No focal neurological deficits. Extremities: Bilateral distal lower extremity still red, with mild draining. Skin: No rashes, lesions or ulcers Psychiatry:  Mood & affect appropriate.     Data Reviewed: I have personally reviewed following labs and imaging studies  CBC: Recent Labs  Lab 04/15/20 1245 04/15/20 1722 04/16/20 0438  WBC 6.5 6.2 5.0  NEUTROABS 4.1  --   --   HGB 11.3* 12.3  10.3*  HCT 36.1 39.5 32.6*  MCV 81.7 83.9 83.0  PLT 196 186 106   Basic Metabolic Panel: Recent Labs  Lab 04/15/20 1245 04/15/20 1722 04/16/20 0438  NA 136  --  136  K 3.8  --  3.8  CL 101  --  101  CO2 25  --  29  GLUCOSE 97  --  92  BUN 15  --  15  CREATININE 0.99 1.01* 0.94  CALCIUM 8.4*  --  8.3*   GFR: Estimated Creatinine Clearance: 94.1 mL/min (by C-G formula based on SCr of 0.94 mg/dL). Liver Function Tests: Recent Labs  Lab 04/15/20 1245  AST 17  ALT 13  ALKPHOS 110  BILITOT 0.8  PROT 8.5*  ALBUMIN 3.3*   No  results for input(s): LIPASE, AMYLASE in the last 168 hours. No results for input(s): AMMONIA in the last 168 hours. Coagulation Profile: No results for input(s): INR, PROTIME in the last 168 hours. Cardiac Enzymes: No results for input(s): CKTOTAL, CKMB, CKMBINDEX, TROPONINI in the last 168 hours. BNP (last 3 results) No results for input(s): PROBNP in the last 8760 hours. HbA1C: No results for input(s): HGBA1C in the last 72 hours. CBG: No results for input(s): GLUCAP in the last 168 hours. Lipid Profile: No results for input(s): CHOL, HDL, LDLCALC, TRIG, CHOLHDL, LDLDIRECT in the last 72 hours. Thyroid Function Tests: No results for input(s): TSH, T4TOTAL, FREET4, T3FREE, THYROIDAB in the last 72 hours. Anemia Panel: No results for input(s): VITAMINB12, FOLATE, FERRITIN, TIBC, IRON, RETICCTPCT in the last 72 hours. Sepsis Labs: No results for input(s): PROCALCITON, LATICACIDVEN in the last 168 hours.  Recent Results (from the past 240 hour(s))  Respiratory Panel by RT PCR (Flu A&B, Covid) - Nasopharyngeal Swab     Status: None   Collection Time: 04/15/20  4:52 PM   Specimen: Nasopharyngeal Swab  Result Value Ref Range Status   SARS Coronavirus 2 by RT PCR NEGATIVE NEGATIVE Final    Comment: (NOTE) SARS-CoV-2 target nucleic acids are NOT DETECTED.  The SARS-CoV-2 RNA is generally detectable in upper respiratoy specimens during the acute phase of infection. The lowest concentration of SARS-CoV-2 viral copies this assay can detect is 131 copies/mL. A negative result does not preclude SARS-Cov-2 infection and should not be used as the sole basis for treatment or other patient management decisions. A negative result may occur with  improper specimen collection/handling, submission of specimen other than nasopharyngeal swab, presence of viral mutation(s) within the areas targeted by this assay, and inadequate number of viral copies (<131 copies/mL). A negative result must be  combined with clinical observations, patient history, and epidemiological information. The expected result is Negative.  Fact Sheet for Patients:  PinkCheek.be  Fact Sheet for Healthcare Providers:  GravelBags.it  This test is no t yet approved or cleared by the [place] FDA and  has been authorized for detection and/or diagnosis of SARS-CoV-2 by FDA under an Emergency Use Authorization (EUA). This EUA will remain  in effect (meaning this test can be used) for the duration of the COVID-19 declaration under Section 564(b)(1) of the Act, 21 U.S.C. section 360bbb-3(b)(1), unless the authorization is terminated or revoked sooner.     Influenza A by PCR NEGATIVE NEGATIVE Final   Influenza B by PCR NEGATIVE NEGATIVE Final    Comment: (NOTE) The Xpert Xpress SARS-CoV-2/FLU/RSV assay is intended as an aid in  the diagnosis of influenza from Nasopharyngeal swab specimens and  should not be used as a  sole basis for treatment. Nasal washings and  aspirates are unacceptable for Xpert Xpress SARS-CoV-2/FLU/RSV  testing.  Fact Sheet for Patients: PinkCheek.be  Fact Sheet for Healthcare Providers: GravelBags.it  This test is not yet approved or cleared by the [place] FDA and  has been authorized for detection and/or diagnosis of SARS-CoV-2 by  FDA under an Emergency Use Authorization (EUA). This EUA will remain  in effect (meaning this test can be used) for the duration of the  Covid-19 declaration under Section 564(b)(1) of the Act, 21  U.S.C. section 360bbb-3(b)(1), unless the authorization is  terminated or revoked. Performed at [facility], [address] [place], [place] [address]          Radiology Studies: US Venous Img Lower Bilateral  Result Date: 04/15/2020 CLINICAL DATA:  63 year old female with a history of swelling and pain EXAM:  BILATERAL LOWER EXTREMITY VENOUS DOPPLER ULTRASOUND TECHNIQUE: Gray-scale sonography with graded compression, as well as color Doppler and duplex ultrasound were performed to evaluate the lower extremity deep venous systems from the level of the common femoral vein and including the common femoral, femoral, profunda femoral, popliteal and calf veins including the posterior tibial, peroneal and gastrocnemius veins when visible. The superficial great saphenous vein was also interrogated. Spectral Doppler was utilized to evaluate flow at rest and with distal augmentation maneuvers in the common femoral, femoral and popliteal veins. COMPARISON:  None. FINDINGS: RIGHT LOWER EXTREMITY Common Femoral Vein: No evidence of thrombus. Normal compressibility, respiratory phasicity and response to augmentation. Saphenofemoral Junction: No evidence of thrombus. Normal compressibility and flow on color Doppler imaging. Profunda Femoral Vein: No evidence of thrombus. Normal compressibility and flow on color Doppler imaging. Femoral Vein: No evidence of thrombus. Normal compressibility, respiratory phasicity and response to augmentation. Popliteal Vein: No evidence of thrombus. Normal compressibility, respiratory phasicity and response to augmentation. Calf Veins: Tibial veins are not visualized Superficial Great Saphenous Vein: No evidence of thrombus. Normal compressibility and flow on color Doppler imaging. Other Findings:  None. LEFT LOWER EXTREMITY Common Femoral Vein: No evidence of thrombus. Normal compressibility, respiratory phasicity and response to augmentation. Saphenofemoral Junction: No evidence of thrombus. Normal compressibility and flow on color Doppler imaging. Profunda Femoral Vein: No evidence of thrombus. Normal compressibility and flow on color Doppler imaging. Femoral Vein: No evidence of thrombus. Normal compressibility, respiratory phasicity and response to augmentation. Popliteal Vein: No evidence of  thrombus. Normal compressibility, respiratory phasicity and response to augmentation. Calf Veins: Tibial veins are not visualized Superficial Great Saphenous Vein: No evidence of thrombus. Normal compressibility and flow on color Doppler imaging Other Findings:  None. IMPRESSION: Sonographic survey of the bilateral lower extremities negative for DVT Electronically Signed   By: Corrie Mckusick D.O.   On: 04/15/2020 16:12        Scheduled Meds: . acidophilus  1 capsule Oral Daily  . atenolol  25 mg Oral Daily  . doxycycline  100 mg Oral Q12H  . enoxaparin (LOVENOX) injection  40 mg Subcutaneous Q12H  . furosemide  20 mg Oral Daily  . influenza vac split quadrivalent PF  0.5 mL Intramuscular Tomorrow-1000  . pneumococcal 23 valent vaccine  0.5 mL Intramuscular Tomorrow-1000   Continuous Infusions: . ampicillin-sulbactam (UNASYN) IV 3 g (04/16/20 1032)     LOS: 1 day    Time spent: 27 minutes    Sharen Hones, MD [facility]   To contact the attending provider between 7A-7P or the covering provider during after hours 7P-7A, please log  into the web site [email or web address] and access using universal [place] password for that web site. If you do not have the password, please call the hospital operator.  04/16/2020, 12:18 PM

## 2020-04-17 LAB — BASIC METABOLIC PANEL
Anion gap: 8 (ref 5–15)
BUN: 15 mg/dL (ref 8–23)
CO2: 29 mmol/L (ref 22–32)
Calcium: 8.4 mg/dL — ABNORMAL LOW (ref 8.9–10.3)
Chloride: 102 mmol/L (ref 98–111)
Creatinine, Ser: 0.88 mg/dL (ref 0.44–1.00)
GFR, Estimated: >60 mL/min (ref >60)
Glucose, Bld: 100 mg/dL — ABNORMAL HIGH (ref 70–99)
Potassium: 4 mmol/L (ref 3.5–5.1)
Sodium: 139 mmol/L (ref 135–145)

## 2020-04-17 LAB — CBC WITH DIFFERENTIAL/PLATELET
Abs Immature Granulocytes: 0.02 10*3/uL (ref 0.00–0.07)
Basophils Absolute: 0 10*3/uL (ref 0.0–0.1)
Basophils Relative: 1 %
Eosinophils Absolute: 0 10*3/uL (ref 0.0–0.5)
Eosinophils Relative: 0 %
HCT: 34.6 % — ABNORMAL LOW (ref 36.0–46.0)
Hemoglobin: 10.9 g/dL — ABNORMAL LOW (ref 12.0–15.0)
Immature Granulocytes: 1 %
Lymphocytes Relative: 31 %
Lymphs Abs: 1.3 10*3/uL (ref 0.7–4.0)
MCH: 26.2 pg (ref 26.0–34.0)
MCHC: 31.5 g/dL (ref 30.0–36.0)
MCV: 83.2 fL (ref 80.0–100.0)
Monocytes Absolute: 0.6 10*3/uL (ref 0.1–1.0)
Monocytes Relative: 13 %
Neutro Abs: 2.3 10*3/uL (ref 1.7–7.7)
Neutrophils Relative %: 54 %
Platelets: 175 10*3/uL (ref 150–400)
RBC: 4.16 MIL/uL (ref 3.87–5.11)
RDW: 14.6 % (ref 11.5–15.5)
WBC: 4.3 10*3/uL (ref 4.0–10.5)
nRBC: 0 % (ref 0.0–0.2)

## 2020-04-17 LAB — HEMOGLOBIN A1C
Hgb A1c MFr Bld: 5.3 % (ref 4.8–5.6)
Mean Plasma Glucose: 105.41 mg/dL

## 2020-04-17 LAB — MAGNESIUM: Magnesium: 2 mg/dL (ref 1.7–2.4)

## 2020-04-17 MED ORDER — DOXYCYCLINE HYCLATE 100 MG PO TABS: 100.0000 mg | ORAL_TABLET | Freq: Two times a day (BID) | ORAL | 0 refills | Status: AC

## 2020-04-17 NOTE — Discharge Instructions (Signed)
1.  Follow-up with PCP in 1 week. 2.  Follow-up with wound care as scheduled.

## 2020-04-17 NOTE — Progress Notes (Signed)
EMS here to transport pt. Belonging sent

## 2020-04-17 NOTE — Discharge Summary (Signed)
Physician Discharge Summary  Patient ID: [name] MRN: [id number] DOB/AGE: April 30, 1956 64 y.o.  Admit date: 04/15/2020 Discharge date: 04/17/2020  Admission Diagnoses:  Discharge Diagnoses:  Active Problems:   Lymphedema   OSA (obstructive sleep apnea)   HTN (hypertension)   Bilateral lower leg cellulitis   Discharged Condition: good  Hospital Course: [name] is a 64 year old female with a history of morbid obesity, chronic bilateral lower extremity lymphedema, asthma, obstructive sleep apnea on CPAP, essential hypertension who present to the emergency room with bilateral lower extremity redness and swelling.  Duplex ultrasound ruled out a DVT.  Patient was started on Unasyn and doxycycline for cellulitis.  #1.  Bilateral lower extremity cellulitis.  Secondary to bilateral lower extremity lymphedema. Condition is improved. Currently her bilateral lower extremity have chronic changes, no redness anymore.  We will continue 5 days of doxycycline.  2.  Essential hypertension. Continue home medicines.  3.  Obstructive sleep apnea. CPAP.  4.  Morbid obesity.  Consults: None  Significant Diagnostic Studies:  BILATERAL LOWER EXTREMITY VENOUS DOPPLER ULTRASOUND  TECHNIQUE: Gray-scale sonography with graded compression, as well as color Doppler and duplex ultrasound were performed to evaluate the lower extremity deep venous systems from the level of the common femoral vein and including the common femoral, femoral, profunda femoral, popliteal and calf veins including the posterior tibial, peroneal and gastrocnemius veins when visible. The superficial great saphenous vein was also interrogated. Spectral Doppler was utilized to evaluate flow at rest and with distal augmentation maneuvers in the common femoral, femoral and popliteal veins.  COMPARISON:  None.  FINDINGS: RIGHT LOWER EXTREMITY  Common Femoral Vein: No evidence of thrombus.  Normal compressibility, respiratory phasicity and response to augmentation.  Saphenofemoral Junction: No evidence of thrombus. Normal compressibility and flow on color Doppler imaging.  Profunda Femoral Vein: No evidence of thrombus. Normal compressibility and flow on color Doppler imaging.  Femoral Vein: No evidence of thrombus. Normal compressibility, respiratory phasicity and response to augmentation.  Popliteal Vein: No evidence of thrombus. Normal compressibility, respiratory phasicity and response to augmentation.  Calf Veins: Tibial veins are not visualized  Superficial Great Saphenous Vein: No evidence of thrombus. Normal compressibility and flow on color Doppler imaging.  Other Findings:  None.  LEFT LOWER EXTREMITY  Common Femoral Vein: No evidence of thrombus. Normal compressibility, respiratory phasicity and response to augmentation.  Saphenofemoral Junction: No evidence of thrombus. Normal compressibility and flow on color Doppler imaging.  Profunda Femoral Vein: No evidence of thrombus. Normal compressibility and flow on color Doppler imaging.  Femoral Vein: No evidence of thrombus. Normal compressibility, respiratory phasicity and response to augmentation.  Popliteal Vein: No evidence of thrombus. Normal compressibility, respiratory phasicity and response to augmentation.  Calf Veins: Tibial veins are not visualized  Superficial Great Saphenous Vein: No evidence of thrombus. Normal compressibility and flow on color Doppler imaging  Other Findings:  None.  IMPRESSION: Sonographic survey of the bilateral lower extremities negative for DVT   Electronically Signed   By: Corrie Mckusick D.O.   On: 04/15/2020 16:12    Treatments: Unasyn and doxycycline  Discharge Exam: Blood pressure (!) 147/60, pulse 71, temperature 98.4 F (36.9 C), temperature source Oral, resp. rate 20, height 5\' 3"  (1.6 m), weight (!) 167.8 kg, SpO2 100  %. General appearance: alert and cooperative Resp: clear to auscultation bilaterally Cardio: regular rate and rhythm, S1, S2 normal, no murmur, click, rub or gallop GI: soft, non-tender; bowel sounds normal; no masses,  no organomegaly Extremities:  Thigh numbness resolved.  Distal bilateral lower extremity chronic skin changes, no redness.  Disposition: Discharge disposition: 01-Home or Self Care       Discharge Instructions    Diet - low sodium heart healthy   Complete by: As directed    Increase activity slowly   Complete by: As directed      Allergies as of 04/17/2020      Reactions   Vancomycin Itching, Nausea And Vomiting   Ace Inhibitors Hives, Swelling   Ibuprofen Hives, Swelling      Medication List    TAKE these medications   acetaminophen 500 MG tablet Commonly known as: TYLENOL Take 500-1,000 mg by mouth every 6 (six) hours as needed for mild pain or fever.   acidophilus Caps capsule Take 1 capsule by mouth daily.   atenolol 50 MG tablet Commonly known as: TENORMIN Take 50 mg by mouth daily.   cetirizine 10 MG tablet Commonly known as: ZYRTEC Take 10 mg by mouth daily as needed for allergies.   Dermacloud Crea Apply 1 application topically daily.   Dermacloud Crea Apply 1 application topically daily.   doxycycline 100 MG tablet Commonly known as: VIBRA-TABS Take 1 tablet (100 mg total) by mouth every 12 (twelve) hours for 5 days.   furosemide 20 MG tablet Commonly known as: LASIX Take 1 tablet (20 mg total) by mouth daily.   nystatin powder Generic drug: nystatin Apply 1 application topically 3 (three) times daily.       Follow-up Information    Schmucker, Monica, FNP Follow up in 1 week(s).   Specialty: Nurse Practitioner Contact information: [address] [place] [address] [phone]               Signed: Sharen Hones 04/17/2020, 8:08 AM

## 2020-04-17 NOTE — Progress Notes (Signed)
DISCHARGE NOTE:  Pt given discharge instructions. Pt verbalized understanding. EMS called for transportation.

## 2020-04-18 IMAGING — CT CT ANGIO CHEST-ABD-PELV FOR DISSECTION W/ AND WO/W CM
3 of 8 series · 15 of 46 positions shown, 17 images · IV contrast (APPLIED)
Comparison: CT abdomen/pelvis 04/17/2018

CLINICAL DATA: Stabbing upper back pain beginning last week.

EXAM:
CT ANGIOGRAPHY CHEST, ABDOMEN AND PELVIS
TECHNIQUE: Multidetector CT imaging through the chest, abdomen and pelvis was
performed using the standard protocol during bolus administration of
intravenous contrast. Multiplanar reconstructed images and MIPs were
obtained and reviewed to evaluate the vascular anatomy. Images
through the thorax were obtained pre and post contrast.
CONTRAST:  100mL OMNIPAQUE IOHEXOL 350 MG/ML SOLN

[Series 6: axial arterial · axial · arterial · 0.70mm/px · z∈[+238,+702]mm · 9 of 195 slices shown, 11 images]
[im 20/195  soft-tissue]
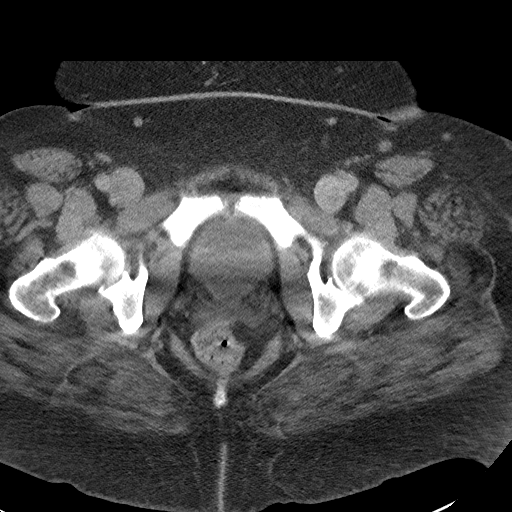
[im 20/195  bone]
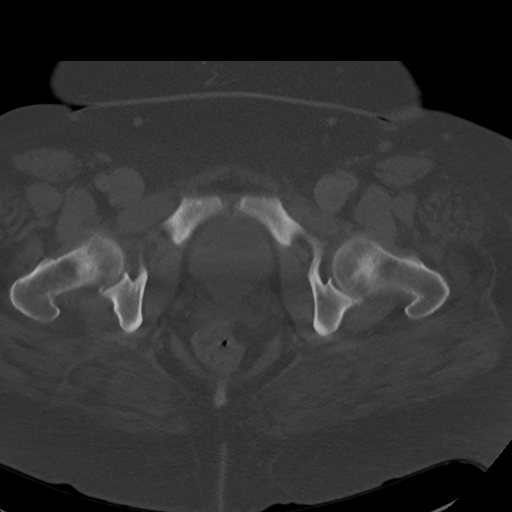
[im 39/195  soft-tissue]
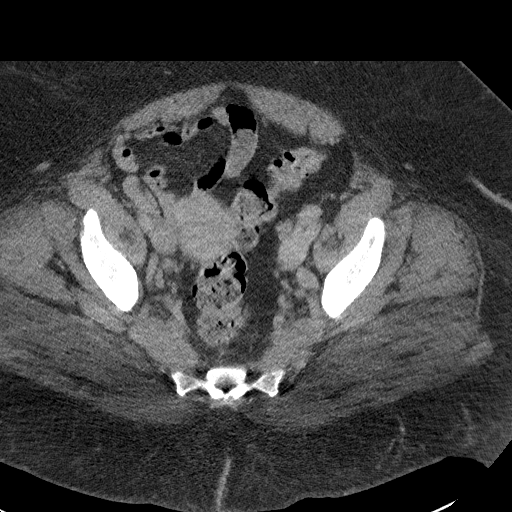
[im 59/195  soft-tissue]
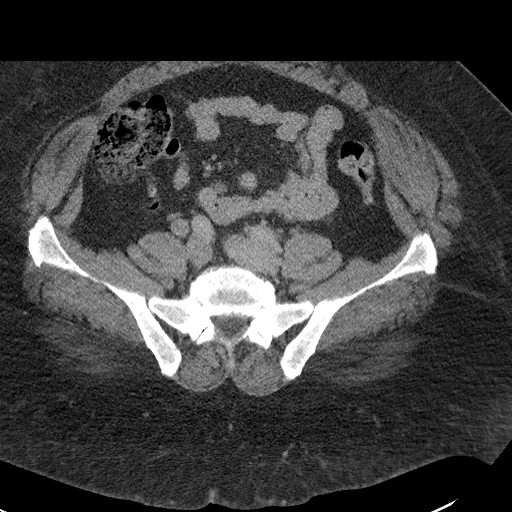
[im 78/195  soft-tissue]
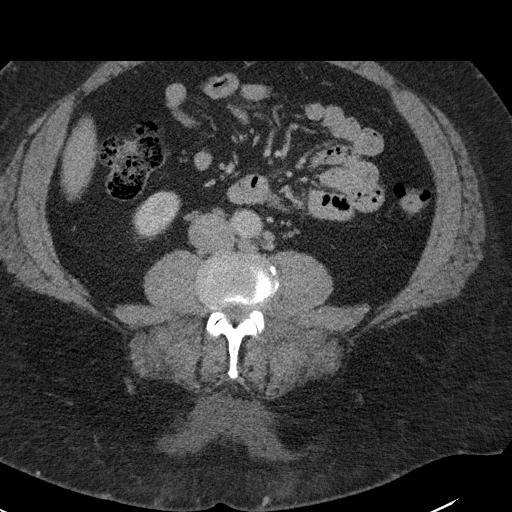
[im 98/195  soft-tissue]
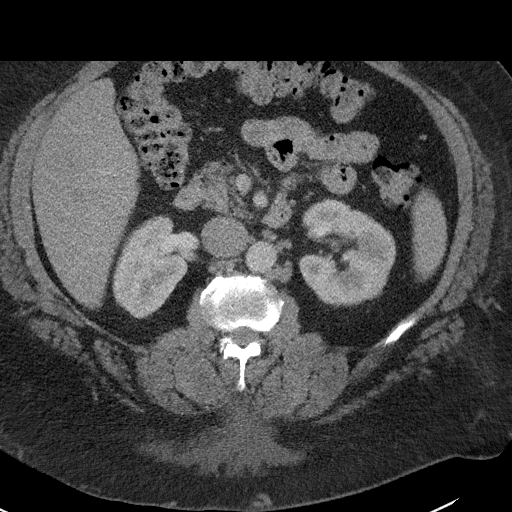
[im 117/195  soft-tissue]
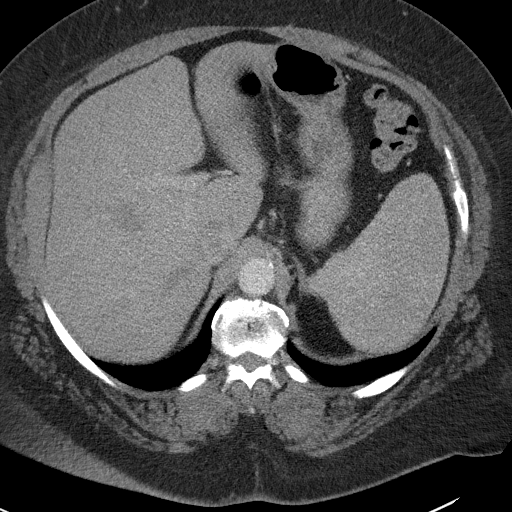
[im 136/195  soft-tissue]
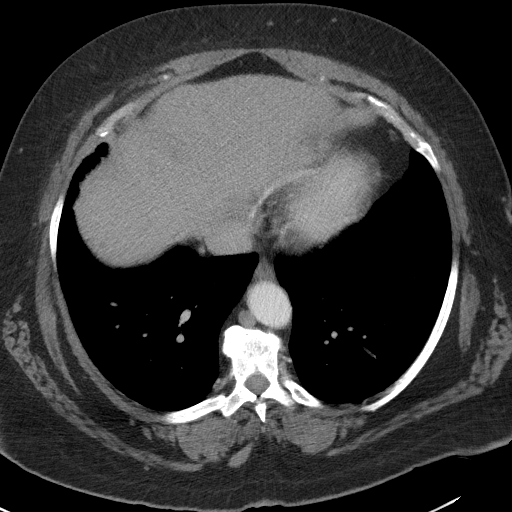
[im 156/195  soft-tissue]
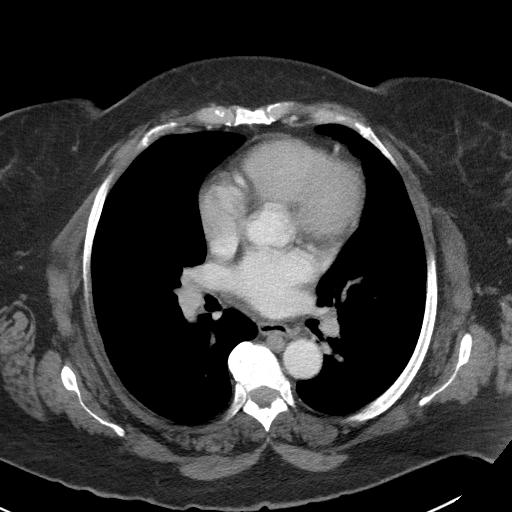
[im 175/195  soft-tissue]
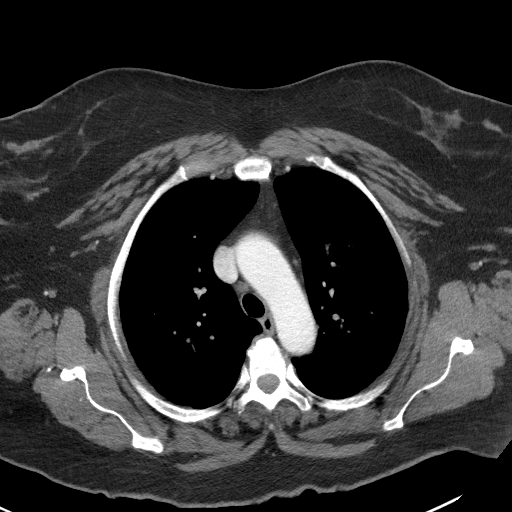
[im 175/195  bone]
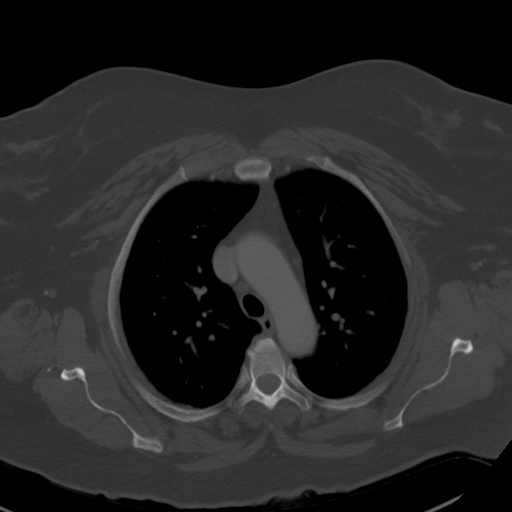

[Series 7: lung · axial · 0.70mm/px · z∈[+214,+360]mm · 3 of 293 slices shown]
[im 19/293  bone]
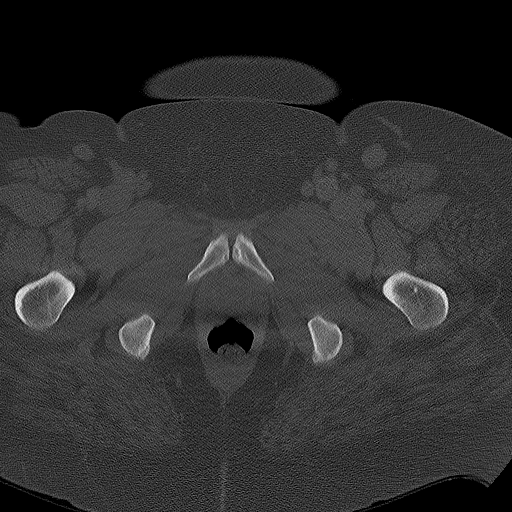
[im 55/293  bone]
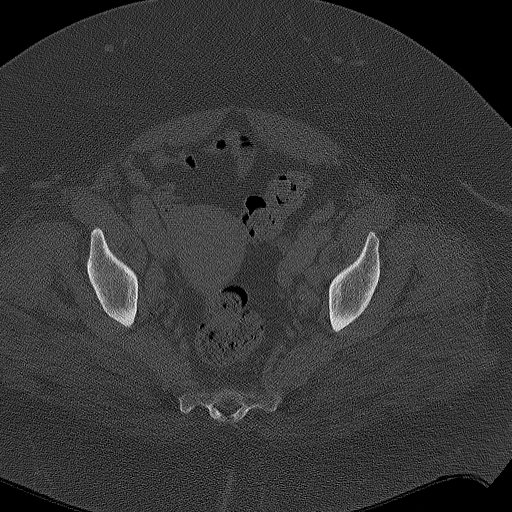
[im 92/293  bone]
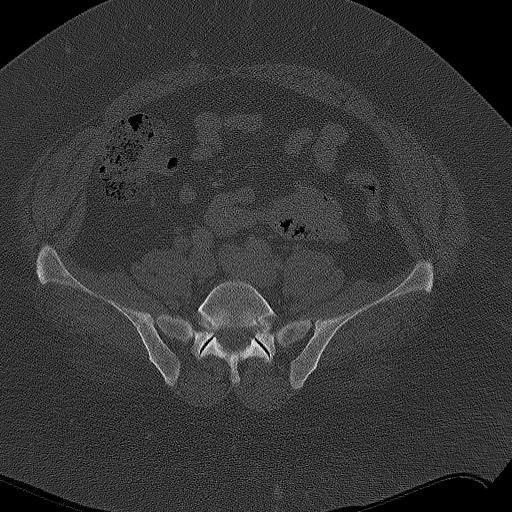

[Series 8: coronals · coronal · 0.99mm/px · 3 of 189 slices shown]
[im 48/189  soft-tissue]
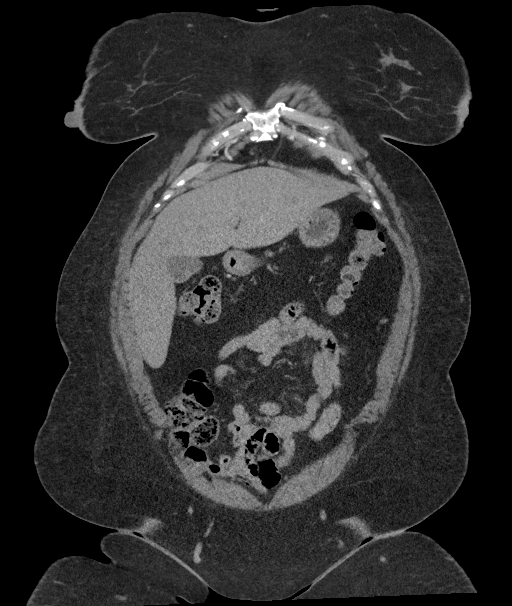
[im 95/189  soft-tissue]
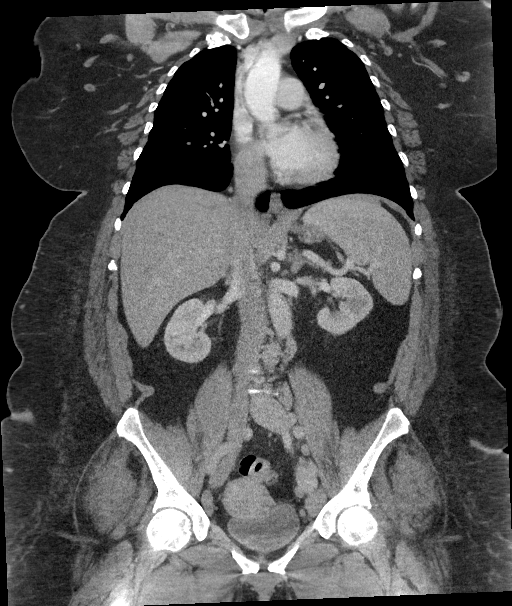
[im 142/189  soft-tissue]
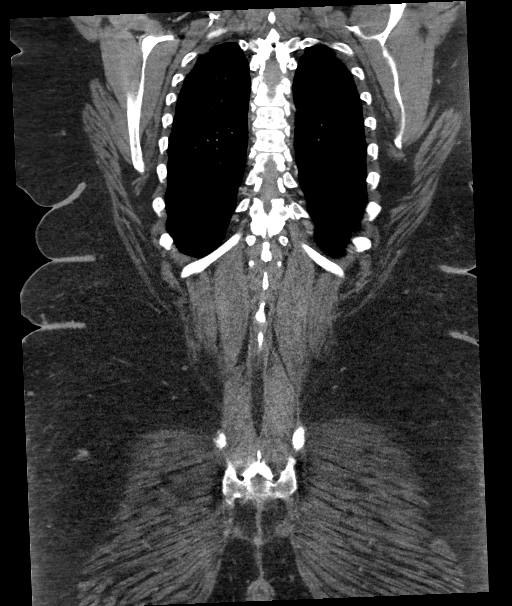

[15 of 46 positions shown; findings below may reference images not displayed]

FINDINGS: CTA CHEST FINDINGS

Cardiovascular: Heart is normal size. Thoracic aorta is normal in
caliber without dissection or aneurysm. Pulmonary arterial system is
unremarkable. Remaining vascular structures are unremarkable.

Mediastinum/Nodes: No mediastinal or hilar adenopathy. Remaining
mediastinal structures are normal.

Lungs/Pleura: Lungs are well inflated without focal airspace
consolidation or effusion. Airways are normal.

Musculoskeletal: No acute findings.

Review of the MIP images confirms the above findings.

CTA ABDOMEN AND PELVIS FINDINGS

VASCULAR

Aorta: Normal in caliber without aneurysm or dissection.

Celiac: Patent.

SMA: Patent.

Renals: Patent.

IMA: Patent.

Inflow: Patent.

Veins: Normal.

Review of the MIP images confirms the above findings.

NON-VASCULAR

Hepatobiliary: Liver, gallbladder and biliary tree are within
normal.

Pancreas: Normal.

Spleen: Normal.

Adrenals/Urinary Tract: Adrenal glands are normal. Kidneys are
normal in size without hydronephrosis or nephrolithiasis. Ureters
and bladder are normal.

Stomach/Bowel: Stomach and small bowel are normal. Appendix is
normal. Colon is normal.

Lymphatic: Mild increased size and number of several small
periaortic, iliac chain and pelvic sidewall lymph nodes. Largest
node over the left pelvic sidewall measures 1.7 cm by short axis.
Findings are stable compared to previous exam.

Reproductive: Normal.

Other: No free fluid or focal inflammatory change.

Musculoskeletal: Mild degenerative change of the spine with subtle
grade 1 anterolisthesis of L4 on L5.

Review of the MIP images confirms the above findings.
IMPRESSION: Unremarkable thoracoabdominal aorta without aneurysm or dissection.
No acute findings in the chest, abdomen or pelvis.

Mild nonspecific periaortic, iliac chain and pelvic sidewall
adenopathy with the largest node over the left pelvic sidewall
measuring 1.7 cm by short axis without significant change from
previous exam and may be reactive. Recommend clinical correlation.

## 2020-04-20 LAB — CULTURE, BLOOD (ROUTINE X 2)
Culture: NO GROWTH
Culture: NO GROWTH
Special Requests: ADEQUATE

## 2020-05-04 IMAGING — US US EXTREM LOW VENOUS
1 series · 13 of 24 positions shown · non-contrast
Comparison: None.

CLINICAL DATA: Bilateral leg edema.



[Series 1: us extrem low venous · 13 of 66 slices shown]
[im 1/66]
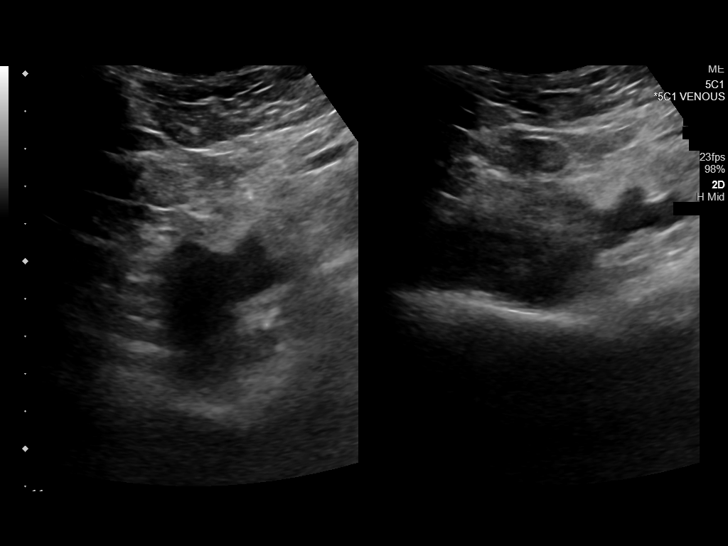
[im 6/66]
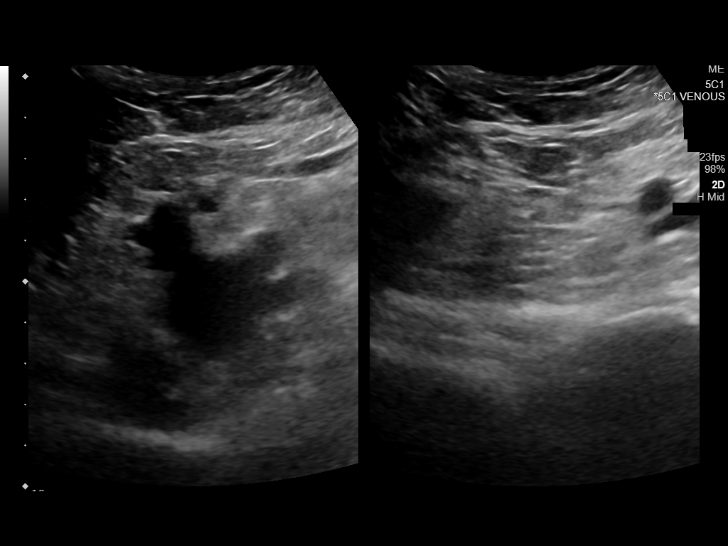
[im 12/66]
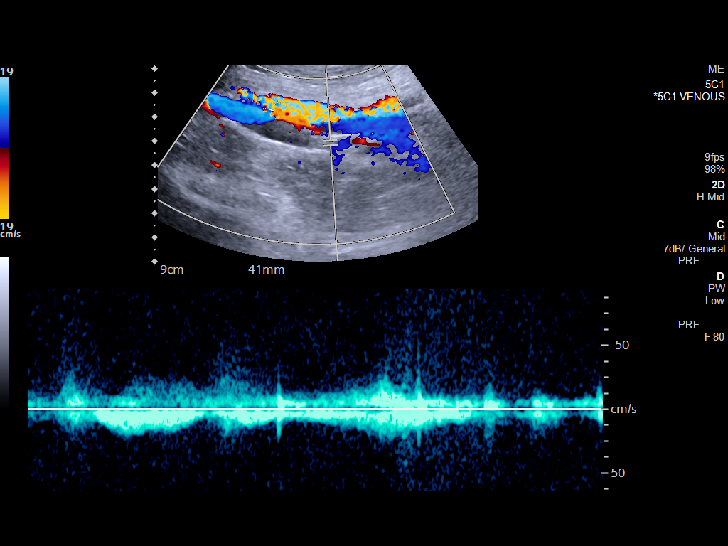
[im 17/66]
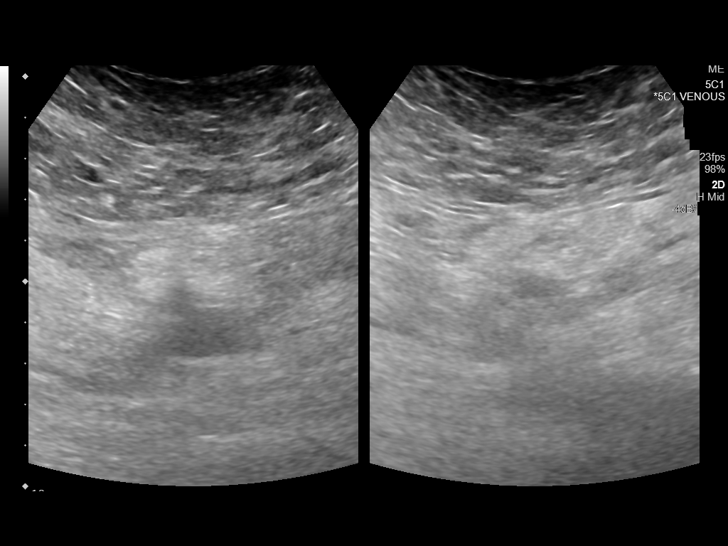
[im 23/66]
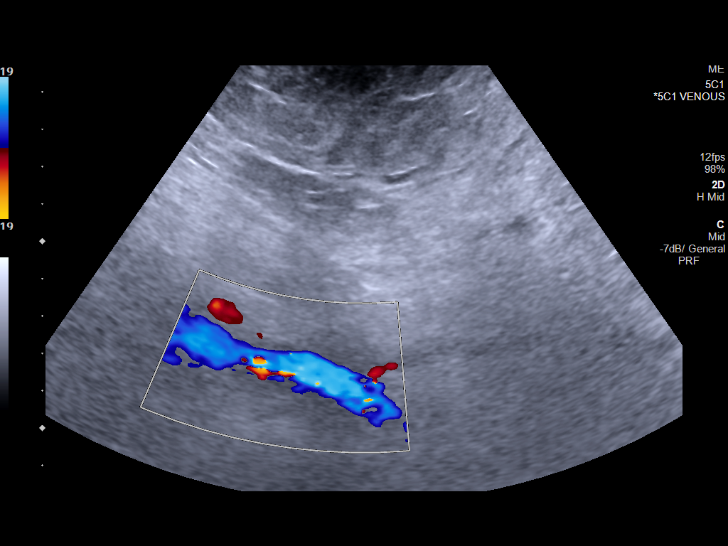
[im 29/66]
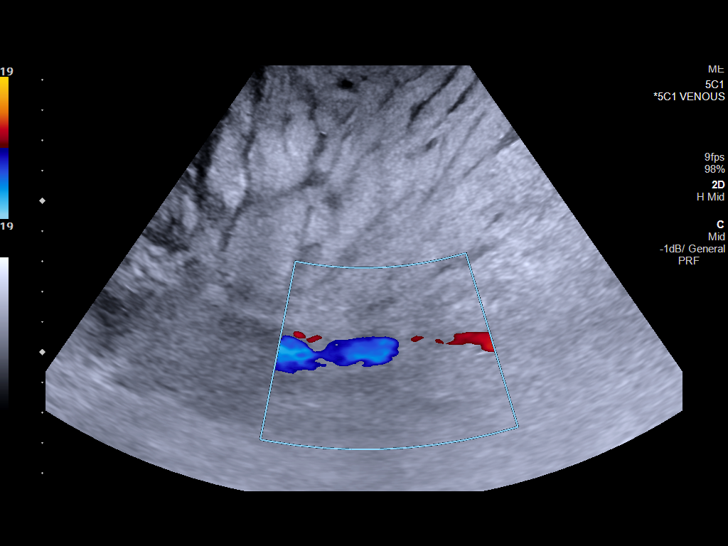
[im 34/66]
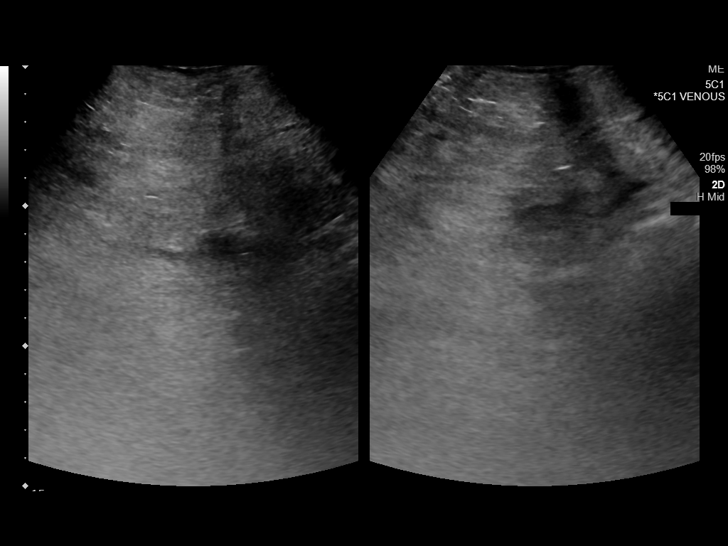
[im 37/66]
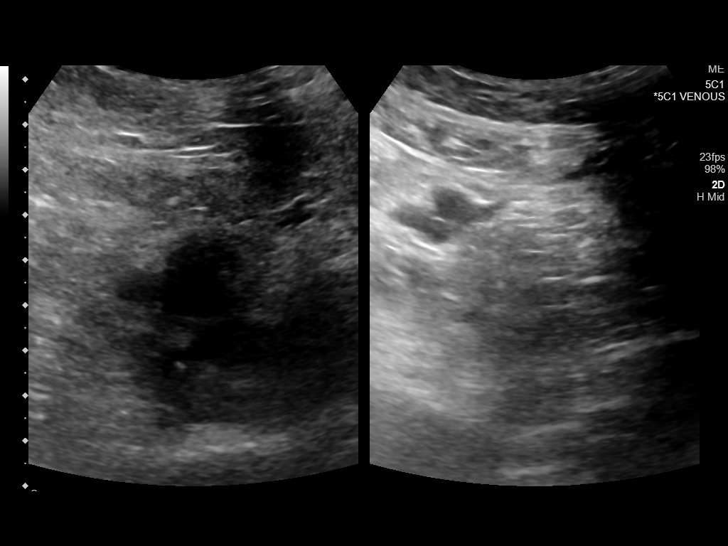
[im 43/66]
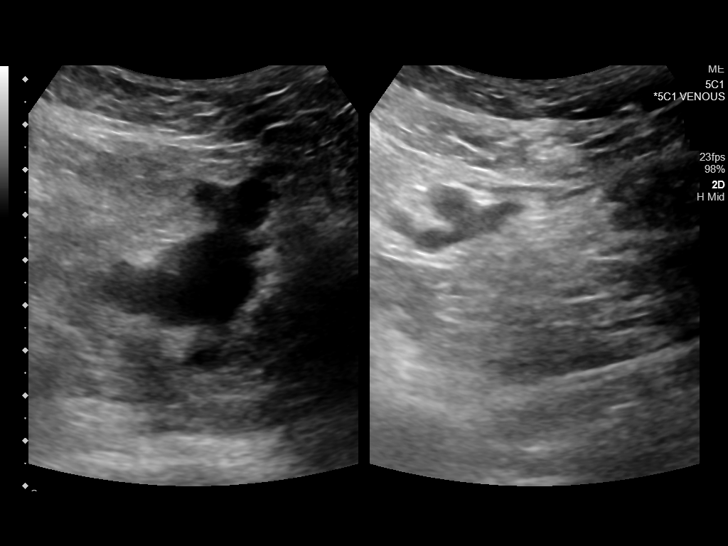
[im 49/66]
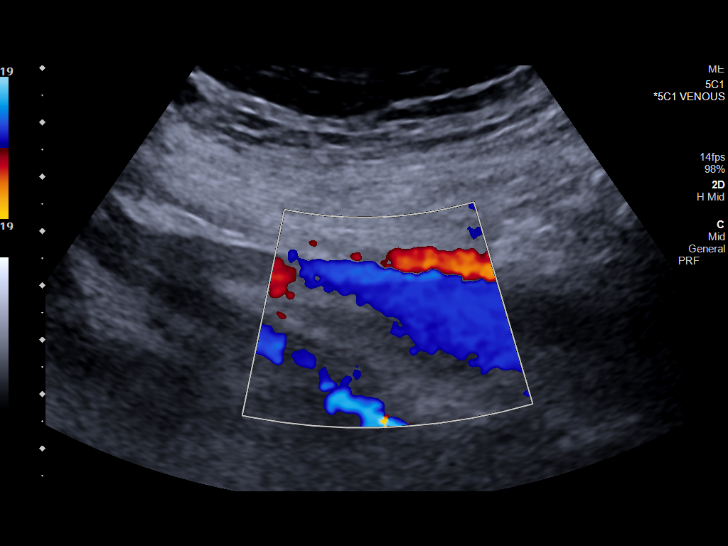
[im 54/66]
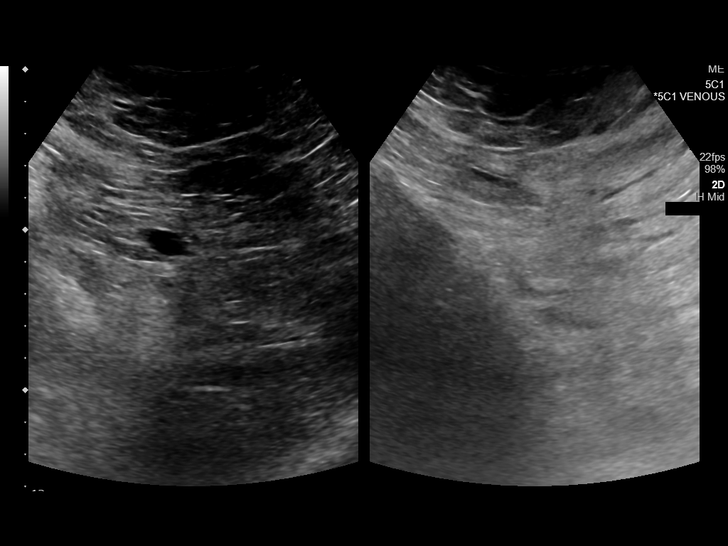
[im 60/66]
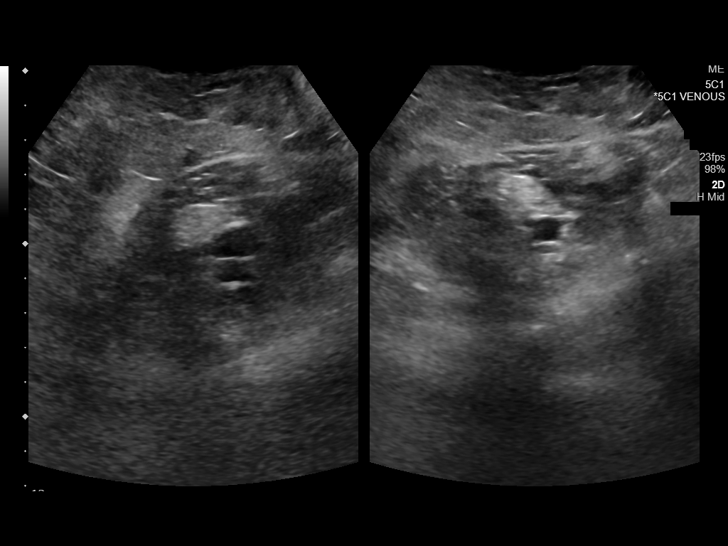
[im 66/66]
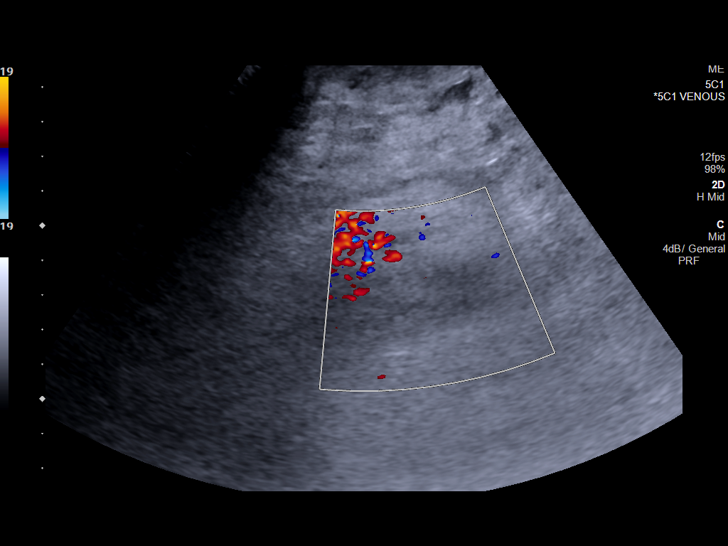

[13 of 24 positions shown; findings below may reference images not displayed]

FINDINGS: RIGHT LOWER EXTREMITY

Common Femoral Vein: No evidence of thrombus. Normal
compressibility, respiratory phasicity and response to augmentation.

Saphenofemoral Junction: No evidence of thrombus. Normal
compressibility and flow on color Doppler imaging.

Profunda Femoral Vein: No evidence of thrombus. Normal
compressibility and flow on color Doppler imaging.

Femoral Vein: No evidence of thrombus. Normal compressibility,
respiratory phasicity and response to augmentation.

Popliteal Vein: No evidence of thrombus. Normal compressibility,
respiratory phasicity and response to augmentation.

Calf Veins: No evidence of thrombus. Normal compressibility and flow
on color Doppler imaging.

Superficial Great Saphenous Vein: No evidence of thrombus. Normal
compressibility.

Venous Reflux:  Not evaluated

Other Findings:  Lower extremity edema is noted.

LEFT LOWER EXTREMITY

Common Femoral Vein: No evidence of thrombus. Normal
compressibility, respiratory phasicity and response to augmentation.

Saphenofemoral Junction: No evidence of thrombus. Normal
compressibility and flow on color Doppler imaging.

Profunda Femoral Vein: No evidence of thrombus. Normal
compressibility and flow on color Doppler imaging.

Femoral Vein: No evidence of thrombus. Normal compressibility,
respiratory phasicity and response to augmentation.

Popliteal Vein: No evidence of thrombus. Normal compressibility,
respiratory phasicity and response to augmentation.

Calf Veins: No evidence of thrombus. Normal compressibility and flow
on color Doppler imaging.

Superficial Great Saphenous Vein: No evidence of thrombus. Normal
compressibility.

Venous Reflux:  Not evaluated

Other Findings:  Lower extremity edema is noted.
IMPRESSION: 1. No DVT.  The calf veins were poorly evaluated.
2. Nonspecific lower extremity edema is noted.

## 2020-05-06 ENCOUNTER — Other Ambulatory Visit: Payer: Self-pay

## 2020-05-06 ENCOUNTER — Encounter: Payer: Medicaid Other | Admitting: Physician Assistant

## 2020-05-06 DIAGNOSIS — E11622 Type 2 diabetes mellitus with other skin ulcer: Secondary | ICD-10-CM | POA: Diagnosis not present

## 2020-05-06 DIAGNOSIS — L97821 Non-pressure chronic ulcer of other part of left lower leg limited to breakdown of skin: Secondary | ICD-10-CM | POA: Diagnosis present

## 2020-05-06 DIAGNOSIS — Q820 Hereditary lymphedema: Secondary | ICD-10-CM | POA: Diagnosis not present

## 2020-05-06 DIAGNOSIS — L97811 Non-pressure chronic ulcer of other part of right lower leg limited to breakdown of skin: Secondary | ICD-10-CM | POA: Diagnosis not present

## 2020-05-06 DIAGNOSIS — I10 Essential (primary) hypertension: Secondary | ICD-10-CM | POA: Insufficient documentation

## 2020-05-06 DIAGNOSIS — Z886 Allergy status to analgesic agent status: Secondary | ICD-10-CM | POA: Insufficient documentation

## 2020-05-06 DIAGNOSIS — Z881 Allergy status to other antibiotic agents status: Secondary | ICD-10-CM | POA: Insufficient documentation

## 2020-05-06 DIAGNOSIS — L03115 Cellulitis of right lower limb: Secondary | ICD-10-CM | POA: Insufficient documentation

## 2020-05-06 IMAGING — DX DG CHEST 1V PORT
1 series · 1 of 1 positions shown · non-contrast
Comparison: 03/25/2019

CLINICAL DATA: Shortness of breath

EXAM:
PORTABLE CHEST 1 VIEW

[chest ap]
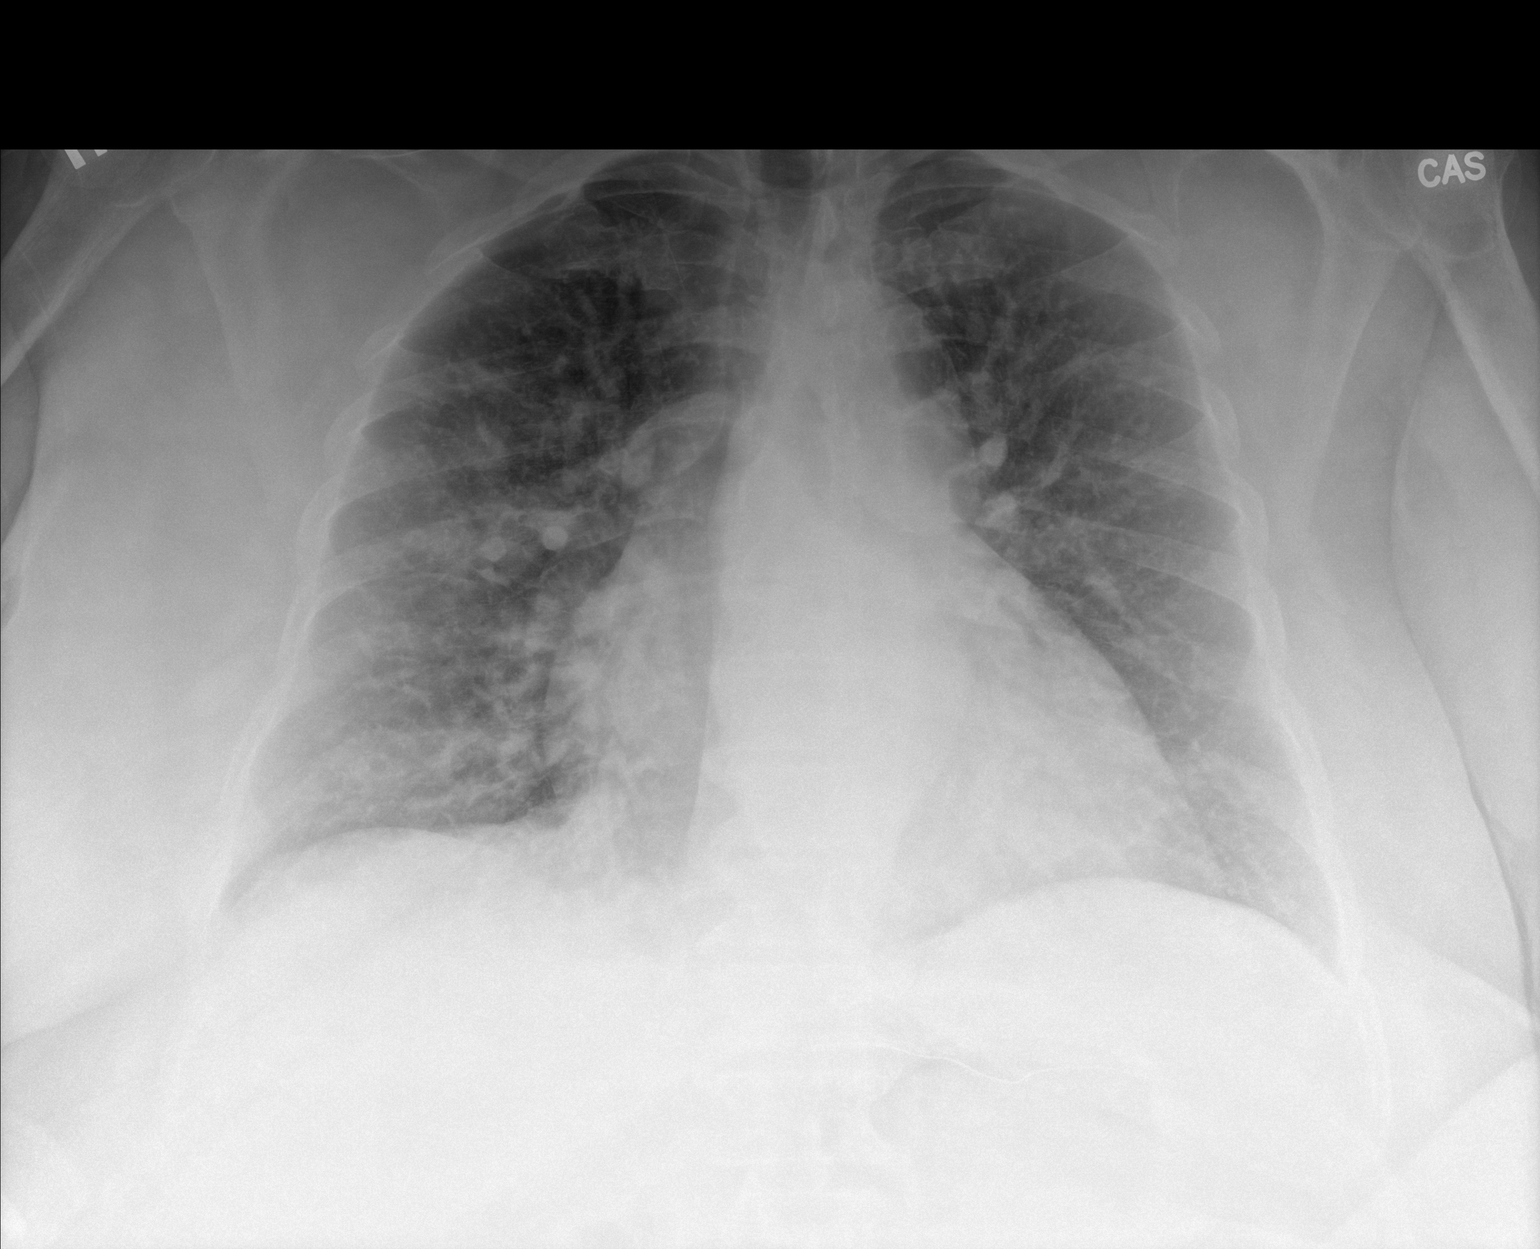

[1 of 1 positions shown; findings below may reference images not displayed]

FINDINGS: Cardiomegaly. Pulmonary vascular prominence without acute airspace
opacity. The visualized skeletal structures are unremarkable.
IMPRESSION: Cardiomegaly. Pulmonary vascular prominence without overt edema or
acute abnormality of the lungs.

## 2020-05-06 NOTE — Progress Notes (Addendum)
[name], [name] ([id number]) Visit Report for 05/06/2020 Chief Complaint Document Details Patient Name: [name], [name]. Date of Service: 05/06/2020 10:00 AM Medical Record Number: [id number] Patient Account Number: [id number] Date of Birth/Sex: 1956/04/28 (64 y.o. F) Treating RN: Cornell Barman Primary Care Provider: Aretta Nip Other Clinician: Referring Provider: Aretta Nip Treating Provider/Extender: Skipper Cliche in Treatment: 23 Information Obtained from: Patient Chief Complaint Bilateral LE lymphedema Electronic Signature(s) Signed: 05/06/2020 10:28:29 AM By: Worthy Keeler PA-C Entered By: Worthy Keeler on 05/06/2020 10:28:29 [name] ([id number]) -------------------------------------------------------------------------------- HPI Details Patient Name: [name] Date of Service: 05/06/2020 10:00 AM Medical Record Number: [id number] Patient Account Number: [id number] Date of Birth/Sex: 1956-01-19 (64 y.o. F) Treating RN: Cornell Barman Primary Care Provider: Aretta Nip Other Clinician: Referring Provider: Aretta Nip Treating Provider/Extender: Skipper Cliche in Treatment: 23 History of Present Illness HPI Description: The patient is a 64 year old female with history of hypertension and a long-standing history of bilateral lower extremity lymphedema (first presented on 4/2) . She has had open ulcers in the past which have always responded to compression therapy. She had briefly been to a lymphedema clinic in the past which helped her at the time. this time around she stopped treatment of her lymphedema pumps approximately 2 weeks ago because of some pain in the knees and then noticed the right leg getting worse. She was seen by her PCP who put her on clindamycin 4 times a day 2 days ago. The patient has seen [facility] and Dr. Delana Meyer had seen her last year where a vascular study including venous and arterial duplex studies were  within normal limits. he had recommended compression stockings and lymphedema pumps and the patient has been using this in about 2 weeks ago. She is known to be diabetic but in the past few time she's gone to her primary care doctor her hemoglobin A1c has been normal. 02/11/2015 - after her last visit she took my advice and went to the ER regarding the progressive cellulitis of her right lower extremity and she was admitted between July 17 and 22nd. She received IV antibiotics and then was sent home on a course of steroid-induced and oral antibiotics. She has improved much since then. 02/17/2015 -- she has been doing fine and the weeping of her legs has remarkably gone down. She has no fresh issues. READMISSION 01/15/18 This patient was given this clinic before most recently in 2016 seen by Dr. Con Memos. She has massive bilateral lymphedema and over the last 2 months this had weeping edema out of the left leg. She has compression pumps but her compliance with these has been minimal. She has [facility] they've been using TCA/ABDs/kerlix under an Ace wrap.she has had recent problems with cellulitis. She was apparently seen in the ER and 12/23/17 and given clindamycin. She was then followed by her primary doctor and given doxycycline and Keflex. The pain seems to have settled down. In April 2018 the patient had arterial studies done at [facility]. This showed triphasic waveforms throughout the right leg and mostly triphasic waveforms on the left except for monophasic at the posterior tibial artery distally. She was not felt to have evidence of right lower extremity arterial stenosis or significant problems on the left side. 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 She also had a right lower extremity venous Doppler in January 2018 this was limited by the patient's body habitus and lymphedema. Most of the proximal veins  were not visualized The patient  presents with an area of denuded skin on the anterior medial part of the left calf. There is weeping edema fluid here. 01/22/18; the patient has somewhat better edema control using her compression pumps twice a day and as a result she has much better epithelialization on the left anterior calf area. Only a small open area remains. 01/29/18; the patient has been compliant with her compression pumps. Both the areas on her calf that healed. The remaining area on the left anterior leg is fully epithelialized Readmission: 02/20/2019 upon evaluation today patient presents for reevaluation due to issues that she is having with the bilateral lower extremities. She actually has wounds open on both legs. On the right she has an area in the crease of her leg on the right around the knee region which is actually draining quite a bit and actually has some fungal type appearance to it. She has been on nystatin powder that seems to have helped to some degree. In regard to the left lower extremity this is actually in the lower portion of her leg closer to the ankle and again is continuing to drain as well unfortunately. There does not appear to be any signs of active infection at this time which is good news. No fevers, chills, nausea, vomiting, or diarrhea. She tells me that since she was seen last year she is actually been doing quite well for the most part with regard to her lower extremities. Unfortunately she now is experiencing a little bit more drainage at this time. She is concerned about getting this under control so that it does not get significantly worse. 02/27/2019 on evaluation today patient appears to be doing somewhat better in regard to her bilateral lower extremity wounds. She has been tolerating the dressing changes without complication. Fortunately there is no signs of active infection at this point. No fevers, chills, nausea, vomiting, or diarrhea. She did get her dressing supplies which is  excellent news she was extremely excited to get these. She also got paperwork from [employer] for their financial assistance program where they may be able to help her out in the future if needed with supplies at discounted prices. 03/06/2019 on evaluation today patient appears to be doing a little worse with regard to both areas of weeping on her bilateral lower extremities. This is around the right medial knee and just above the left ankle. With that being said she is unfortunately not doing as well as I would like to see. I feel like she may need to potentially go see someone at the lymphedema clinic as the wraps that she needs or even beyond what we can do here at the wound care center. She really does not have wounds she just has open areas of weeping that are causing some difficulty for her. Subsequently because of this and the moisture I am concerned about the potential for infection I am going to likely give her a prophylactic antibiotic today, Keflex, just to be on the safe side. Nonetheless again there is no obvious signs of active infection at this time. 03/13/2019 on evaluation today patient appears to be doing well with regard to her bilateral lower extremities where she has been weeping compared to even last week's evaluation. I see some areas of new skin growth which is excellent and overall I am very pleased with how things seem to be progressing. No fevers, chills, nausea, vomiting, or diarrhea. [name], [name] ([id number]) 03/20/2019 on evaluation today patient  unfortunately is continuing to have issues with significant edema of the left lower extremity. Her right side seems to be doing much better. Unfortunately her left side is showing increased weeping of the lower portion of her leg. This is quite unfortunate obviously we were hoping to get her into the lymphedema clinic they really do not seem to when I see her how if she is draining. Despite the fact this is really not wound related  but more lymphedema weeping related. Nonetheless I do not know that this can be helpful for her to even go for that appointment since again I am not sure there is much that they would actually do at this point. We may need to try a 4 layer compression wrap as best we can on her leg. She is on the Augmentin currently although I am still concerned about whether or not there could be potentially something going on infection wise I would obtain a culture though I understand is not the best being that is a surface culture I just 1 to make sure I do not seem to be missing anything. 03/27/2019 on evaluation today patient appears to be doing much better in regard to the left lower extremity compared to last week. Last week she had tremendous weeping which I think was subsequent to infection now she seems to be doing much better and very pleased. This is not completely healed but there is a lot of new skin growth and it has dried out quite a bit. Overall I think that we are doing well with how things are moving along at this time. No fevers, chills, nausea, vomiting, or diarrhea. 04/03/2019 evaluation today patient appears to be doing a little worse this week compared to last time I saw her. I think this may be due to the fact that she is having issues with not being able to sleep in her bed at least not until last night. She is therefore been in a lift chair and subsequently has also had issues with not been able to use her pumps since she could not get in bed. With that being said the patient overall seems to be doing okay I do think I may want extend the antibiotic for a little bit longer at least until we can see if her edema and her weeping gets better and if it is then obviously I can always discontinue the antibiotics as of next week however I want her to continue to have it over the next week. 04/10/2019 on evaluation today patient unfortunately is still doing poorly with regard to her left lower  extremity. Her right is all things considering doing fairly well. On the left however she continues to have spreading of the area of infection and weeping which appears to be even a larger surface area than noted last week. She did have a positive culture for Pseudomonas in particular which seems to have been of concern she still has green/yellow discharge consistent with Pseudomonas and subsequently a tremendous amount of it. This has me obviously still concerned about the infection not really clearing up despite the fact that on culture it appears the Cipro should have been a good option for treating this. I think she may at this point need IV antibiotics since things are not doing better I do not want to get worse and cause sepsis. She is in agreement with the plan and believes as well that she likely does need to go to the hospital for IV vancomycin.  Or something of the like depending on what the recommendation is from the ER. 04/17/2019 on evaluation today patient appears to be doing excellent in regard to her lower extremity on the left. She was in the hospital for several days from when I sent her last we saw her until just this past Tuesday. Fortunately her drainage is significantly improved and in fact is mostly clear. There is just a couple small areas that may still drain a little bit she states that the [facility] they prescribed for her at discharge she went picked up from pharmacy and got home but has not been able to find it since. She is looked everywhere. She is wondering if I will replace that for her today I will be more than happy to do that. 05/01/2019 on evaluation today patient actually appears to be doing quite well with regard to her lower extremities. She occasionally is having areas that will leak and then heal up mainly when a piece of the fibrotic skin pops off but fortunately she is not having any signs of active infection at this time. Overall she also really does not have  any obvious weeping at this time. I do believe however she really needs some compression wraps and I think this may be a good time to get her back to the lymphedema clinic. 05/11/2019 on evaluation today patient actually appears to be doing quite well with regard to her bilateral lower extremities. She occasionally will have a small area that we per another but in general seems to be completely healed which is great news. Overall very pleased with how everything seems to be progressing. She does have her appointment with lymphedema clinic on November 18. 05/25/2019 on evaluation today patient appears to be doing well with regard to her left lower extremity. I am very pleased in this regard. In regard to her right leg this actually did start draining more I think it is mainly due to the fact that her leg is more swollen. I am not seeing any obvious signs of infection at this time although that is definitely something were obviously acutely aware of simply due to the fact that she had an issue not too far back with exactly this issue. Nonetheless I do feel like that lymphedema clinic would still be beneficial for her. I explained obviously if they are not able to do anything treatment wise on the right leg we could at least have them treat her left leg and then proceed from there. The patient is really in agreement with that plan. If they are able to do both as the drainage slows down that I would be happy to let them handle both. 06/01/2019 on evaluation today patient unfortunately appears to be doing worse with regard to her right lower extremity. The left lower extremity is still maintaining at this point. Unfortunately she has been having significantly increased pain over the past several days and has been experiencing as well increased swelling of the right lower extremity. I really do not know that I am seeing anything that appears to be obvious for infection at this point to be peripherally  honest. With that being said the patient does seem to be having much more swelling that she is even experienced in the past and coupled with increased pain in her hip as well I am concerned that again she could potentially have a DVT although I am not 100% sure of this. I think it something that may need to be checked  out. We discussed the possibility of sending her for a DVT study through the hospital but unfortunately transportation is an issue if she does have a DVT I do not want her to wait days to be able to get in for that test however if she has this scheduled as an outpatient that is as fast that she will be able to get the test scheduled for transportation purposes. That will also fall on Thanksgiving so subsequently she did actually be looking at either Friday or even next week before we would know anything back from this. That is much too long in my opinion. Subsequent to the amount of discomfort she is experiencing the patient is actually okay with going to the ER for evaluation today. 06/12/2019 on evaluation today patient actually appears to be doing significantly better compared to last time I saw her. Following when I last saw her she was actually in the hospital from that Monday until the following Sunday almost 1 full week. She actually was placed on Keflex in the hospital following the time for her to be discharged and Dr. Steva Ready has recommended 2 times a day dosing of the Keflex for the next year in order to help with more prophylactic/preventative measures with regard to her developing cellulitis. Overall I think this sounds like an excellent plan. The patient unfortunately is good to have trouble being treated at lymphedema clinic due to the fact that she really cannot get up on the bed that they have there. They also state that they cannot manage her as long as she has anything draining at this point. Obviously that is somewhat unfortunate as she does need help with edema  control but nonetheless we will have to do what we can for her outside of it sounds like the lymphedema clinic scenario at this point. 06/19/2019 on evaluation today patient appears to be doing fairly well with regard to her bilateral lower extremities. She is not nearly as swollen and shows no signs of infection at this point. There is no evidence of cellulitis whatsoever. She also has no open wounds or draining at this point which is also good news. No fever chills noted. She seems to be in very good spirits and in fact appears to be doing quite well. READMISSION 11/27/2019 [name], [name] ([id number]) This is a 64 year old woman that we have had in this clinic several times before including 2015, 16 and 19 and then most recently from 03/20/2019 through 06/19/2019 with bilateral lower extremity lymphedema. She has had previous arterial and reflux studies done years ago which were not all that remarkable. 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 She does have a positive family history and she had large legs starting may be in her 10s. She was recently in hospital from 10/20/2019 through 10/28/2019 with right leg cellulitis. She was given Ancef and clindamycin and then Zosyn when a culture showed Pseudomonas. At that time there was purulent drainage. She was followed by infectious disease Dr Steva Ready. The patient is now back at home. She has noted increased swelling in the right and no drainage in her right leg mostly on the posterior medial aspect in the calf area. She has not had pain or fever. She has literally been improved lysing above dressings because her at the area of this is far too large for standard compression. She has been wrapping the areas with sheets to resorptive pads. She is found these helped somewhat. She  does have an appointment with the lymphedema clinic in [place] in late June. Past medical history includes bilateral  lymphedema, hypertension, obstructive sleep apnea with CPAP. Recent hospitalization with apparently Pseudomonas cellulitis of the right lower leg 12/15/2019 upon evaluation today patient appears to be doing a little bit worse in regard to her right lower extremity. Unfortunately she is having more weeping down in the lower portion of her leg. Fortunately there is no signs of active infection at this time. No fever chills noted. The patient states she is not having increased pain except for when she attempted to use the lymphedema pumps unfortunately she states that she did have pain when she did this. Otherwise we been using absorptive dressings of one type or another she is using diapers at home and then subsequently Ace wraps. In regard to the barrier cream we have discussed the possibility of derma cloud which she would like to try I do not have a problem with that. 12/22/2019 upon evaluation today patient actually appears to be doing better in regard to her leg ulcers at this point. Fortunately there does not appear to be any signs of active infection which is great news and I am extremely pleased with where things are progressing at this time. There is no sign of active infection currently. The patient is very pleased to see things doing so well. 12/29/2019 upon evaluation today patient appears to be doing a little bit better in regard to her weeping in general over her lower extremities. She does have some signs of mild erythema little bit more than what I noted last week or rather last visit. Nonetheless I think that my threshold for switching her antibiotics from Keflex to something else is very low at this point considering that she has had such severe infections in the past that seem to come almost out of nowhere. There is a little erythema and warmth noted of the lower portion of her leg compared to the upper which also makes me want to go ahead and address things more rapidly at this point.  Likely I would switch out the Keflex for something like Levaquin ideally. 7/16; patient with severe bilateral lymphedema. She has superficial wounds albeit almost circumferential now on the left lateral lower leg. This may be new from last time. Small area on the right anterior lower leg and then another area on the right medial lower leg and of pannus fold. She has been using various absorptive garments. She states she is using her compression pumps once a day occasionally twice. Culture from her last visit here was negative 01/29/2020 on evaluation today patient appears to be doing excellent at this point in regard to her legs with regard to infection I see no signs of active infection at this point. She still does have unfortunately areas of weeping this is minimal on the right now her left is actually significantly worse although I do not think it is as bad as last week with Dr. Dellia Nims saw her. She has been trying to pump and elevate her legs is much as possible. She has previously been on the Keflex and in the past for prevention that seems to do fairly well and likely can extend that today. 02/04/2020 on evaluation today patient appears to be doing better in regard to her legs bilaterally. Fortunately there is no signs of active infection at this time which is great news and overall she has less weeping on the left compared to the right and  there is several spots where she is pretty much sealed up with no draining regions. Overall very pleased in this regard. 02/19/2020 on evaluation today patient appears to be doing very well in regard to her wounds currently. Fortunately there is no evidence of active infection overall very pleased with where things stand. She is significantly improved in regard to her edema I am extremely pleased in this regard she tells me that the popping no longer hurts and in fact she actually looks forward to it. 03/04/2020 on evaluation today patient appears to be doing  excellent in regard to her lower extremities. Fortunately there is no signs of active infection at this time. No fevers, chills, nausea, vomiting, or diarrhea. 03/25/2020 on evaluation today patient appears to be doing a little bit more poorly in regard to her legs at this point. She tells me that she is still continue to have issues with drainage and this has been a little bit worse she was getting ready to start taking the Keflex again but wanted to see me first. Fortunately there is no signs of active infection at this time. No fevers, chills, nausea, vomiting, or diarrhea. 04/15/2020 upon evaluation today patient appears to be doing somewhat poorly in regard to her right leg. She tells me she has been having more pain she has been taking the Keflex that was previously prescribed unfortunately that just does not seem to help with this. She was hoping that the pain on her right was actually coming from the fact that she was having issues with her wrap having gotten caught in her recliner. With that being said she tells me that she knew something was not right. Currently her right leg is warm to touch along with being erythematous all the way up to around at least mid thigh as far as I can see. The left leg does not appear to be doing that badly though there is increased weeping around the ankle region. 05/06/2020 on evaluation today patient appears to be doing much better than last time I saw her. She did go to the hospital where she was admitted for 2 days and treated with antibiotic therapy. She was discharged with antibiotics as well and has done extremely well. I am extremely pleased with where things stand today. There is no signs of active infection at this time which is great news. Electronic Signature(s) Signed: 05/06/2020 3:45:09 PM By: Worthy Keeler PA-C Entered By: Worthy Keeler on 05/06/2020 15:45:09 Septer, [name]  ([id number]) -------------------------------------------------------------------------------- Physical Exam Details Patient Name: [name], [name] Date of Service: 05/06/2020 10:00 AM Medical Record Number: [id number] Patient Account Number: [id number] Date of Birth/Sex: June 10, 1956 (64 y.o. F) Treating RN: Cornell Barman Primary Care Provider: Aretta Nip Other Clinician: Referring Provider: Aretta Nip Treating Provider/Extender: Skipper Cliche in Treatment: 65 Constitutional Well-nourished and well-hydrated in no acute distress. Respiratory normal breathing without difficulty. Psychiatric this patient is able to make decisions and demonstrates good insight into disease process. Alert and Oriented x 3. pleasant and cooperative. Notes Upon inspection patient's wound bed actually showed signs of good Epithelization currently there does not appear to be any evidence of active infection which is great news and overall I am extremely pleased with where things stand. Electronic Signature(s) Signed: 05/06/2020 3:45:40 PM By: Worthy Keeler PA-C Entered By: Worthy Keeler on 05/06/2020 15:45:40 [name], [name] ([id number]) -------------------------------------------------------------------------------- Physician Orders Details Patient Name: [name] Date of Service: 05/06/2020 10:00 AM Medical Record Number: [id number] Patient  Account Number: [id number] Date of Birth/Sex: 09-09-1955 (64 y.o. F) Treating RN: Cornell Barman Primary Care Provider: Aretta Nip Other Clinician: Referring Provider: Aretta Nip Treating Provider/Extender: Skipper Cliche in Treatment: 11 Verbal / Phone Orders: No Diagnosis Coding ICD-10 Coding Code Description Q82.0 Hereditary lymphedema L97.811 Non-pressure chronic ulcer of other part of right lower leg limited to breakdown of skin L97.821 Non-pressure chronic ulcer of other part of left lower leg limited to breakdown of  skin Wound Cleansing o Dial antibacterial soap, wash wounds, rinse and pat dry prior to dressing wounds Primary Wound Dressing o Other: - Calamine paste Secondary Dressing o Other - chux, ace wraps bilateral legs Dressing Change Frequency o Change dressing every day. Follow-up Appointments o Return Appointment in 1 month Edema Control o Elevate legs to the level of the heart and pump ankles as often as possible o Compression Pump: Use compression pump on left lower extremity for 60 minutes, twice daily. o Compression Pump: Use compression pump on right lower extremity for 60 minutes, twice daily. Patient Medications Allergies: ibuprofen, ACE Inhibitors Notifications Medication Indication Start End Levaquin 05/09/2020 DOSE 1 - oral 750 mg tablet - 1 tablet oral taken 1 time per day for 14 days. gentamicin 05/09/2020 DOSE topical 0.1 % cream - cream topical applied daily to the open wounds on both legs with dressing changes until healed and infection resolves Electronic Signature(s) Signed: 05/09/2020 9:23:58 AM By: Worthy Keeler PA-C Previous Signature: 05/06/2020 1:29:23 PM Version By: Gretta Cool BSN, RN, CWS, Kim RN, BSN Previous Signature: 05/06/2020 3:59:05 PM Version By: Worthy Keeler PA-C Entered By: Worthy Keeler on 05/09/2020 09:23:57 [name], [name] ([id number]) -------------------------------------------------------------------------------- Problem List Details Patient Name: SAHORY, NORDLING C. Date of Service: 05/06/2020 10:00 AM Medical Record Number: [id number] Patient Account Number: [id number] Date of Birth/Sex: 12/15/1955 (64 y.o. F) Treating RN: Cornell Barman Primary Care Provider: Aretta Nip Other Clinician: Referring Provider: Aretta Nip Treating Provider/Extender: Skipper Cliche in Treatment: 23 Active Problems ICD-10 Encounter Code Description Active Date MDM Diagnosis Q82.0 Hereditary lymphedema 11/27/2019 No Yes L97.811  Non-pressure chronic ulcer of other part of right lower leg limited to 11/27/2019 No Yes breakdown of skin L97.821 Non-pressure chronic ulcer of other part of left lower leg limited to 01/22/2020 No Yes breakdown of skin Inactive Problems Resolved Problems Electronic Signature(s) Signed: 05/06/2020 10:28:22 AM By: Worthy Keeler PA-C Entered By: Worthy Keeler on 05/06/2020 10:28:21 [name], [name] ([id number]) -------------------------------------------------------------------------------- Progress Note Details Patient Name: [name]. Date of Service: 05/06/2020 10:00 AM Medical Record Number: [id number] Patient Account Number: [id number] Date of Birth/Sex: 10-05-1955 (64 y.o. F) Treating RN: Cornell Barman Primary Care Provider: Aretta Nip Other Clinician: Referring Provider: Aretta Nip Treating Provider/Extender: Skipper Cliche in Treatment: 23 Subjective Chief Complaint Information obtained from Patient Bilateral LE lymphedema History of Present Illness (HPI) The patient is a 64 year old female with history of hypertension and a long-standing history of bilateral lower extremity lymphedema (first presented on 4/2) . She has had open ulcers in the past which have always responded to compression therapy. She had briefly been to a lymphedema clinic in the past which helped her at the time. this time around she stopped treatment of her lymphedema pumps approximately 2 weeks ago because of some pain in the knees and then noticed the right leg getting worse. She was seen by her PCP who put her on clindamycin 4 times a day 2 days ago. The patient has seen [facility] and Dr. Delana Meyer had  seen her last year where a vascular study including venous and arterial duplex studies were within normal limits. he had recommended compression stockings and lymphedema pumps and the patient has been using this in about 2 weeks ago. She is known to be diabetic but in the past few time  she's gone to her primary care doctor her hemoglobin A1c has been normal. 02/11/2015 - after her last visit she took my advice and went to the ER regarding the progressive cellulitis of her right lower extremity and she was admitted between July 17 and 22nd. She received IV antibiotics and then was sent home on a course of steroid-induced and oral antibiotics. She has improved much since then. 02/17/2015 -- she has been doing fine and the weeping of her legs has remarkably gone down. She has no fresh issues. READMISSION 01/15/18 This patient was given this clinic before most recently in 2016 seen by Dr. Con Memos. She has massive bilateral lymphedema and over the last 2 months this had weeping edema out of the left leg. She has compression pumps but her compliance with these has been minimal. She has [facility] they've been using TCA/ABDs/kerlix under an Ace wrap.she has had recent problems with cellulitis. She was apparently seen in the ER and 12/23/17 and given clindamycin. She was then followed by her primary doctor and given doxycycline and Keflex. The pain seems to have settled down. In April 2018 the patient had arterial studies done at [facility]. This showed triphasic waveforms throughout the right leg and mostly triphasic waveforms on the left except for monophasic at the posterior tibial artery distally. She was not felt to have evidence of right lower extremity arterial stenosis or significant problems on the left side. 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 She also had a right lower extremity venous Doppler in January 2018 this was limited by the patient's body habitus and lymphedema. Most of the proximal veins were not visualized The patient presents with an area of denuded skin on the anterior medial part of the left calf. There is weeping edema fluid here. 01/22/18; the patient has somewhat better edema control using her compression  pumps twice a day and as a result she has much better epithelialization on the left anterior calf area. Only a small open area remains. 01/29/18; the patient has been compliant with her compression pumps. Both the areas on her calf that healed. The remaining area on the left anterior leg is fully epithelialized Readmission: 02/20/2019 upon evaluation today patient presents for reevaluation due to issues that she is having with the bilateral lower extremities. She actually has wounds open on both legs. On the right she has an area in the crease of her leg on the right around the knee region which is actually draining quite a bit and actually has some fungal type appearance to it. She has been on nystatin powder that seems to have helped to some degree. In regard to the left lower extremity this is actually in the lower portion of her leg closer to the ankle and again is continuing to drain as well unfortunately. There does not appear to be any signs of active infection at this time which is good news. No fevers, chills, nausea, vomiting, or diarrhea. She tells me that since she was seen last year she is actually been doing quite well for the most part with regard to her lower extremities. Unfortunately she now is experiencing a little bit more  drainage at this time. She is concerned about getting this under control so that it does not get significantly worse. 02/27/2019 on evaluation today patient appears to be doing somewhat better in regard to her bilateral lower extremity wounds. She has been tolerating the dressing changes without complication. Fortunately there is no signs of active infection at this point. No fevers, chills, nausea, vomiting, or diarrhea. She did get her dressing supplies which is excellent news she was extremely excited to get these. She also got paperwork from [employer] for their financial assistance program where they may be able to help her out in the future if needed with supplies  at discounted prices. 03/06/2019 on evaluation today patient appears to be doing a little worse with regard to both areas of weeping on her bilateral lower extremities. This is around the right medial knee and just above the left ankle. With that being said she is unfortunately not doing as well as I would like to see. I feel like she may need to potentially go see someone at the lymphedema clinic as the wraps that she needs or even beyond what we can do here at the wound care center. She really does not have wounds she just has open areas of weeping that are causing some difficulty for her. Subsequently because of this and the moisture I am concerned about the potential for infection I am going to likely give her a prophylactic antibiotic today, Keflex, just to be on the safe side. Nonetheless again there is no obvious signs of active infection at this time. [name], [name] ([id number]) 03/13/2019 on evaluation today patient appears to be doing well with regard to her bilateral lower extremities where she has been weeping compared to even last week's evaluation. I see some areas of new skin growth which is excellent and overall I am very pleased with how things seem to be progressing. No fevers, chills, nausea, vomiting, or diarrhea. 03/20/2019 on evaluation today patient unfortunately is continuing to have issues with significant edema of the left lower extremity. Her right side seems to be doing much better. Unfortunately her left side is showing increased weeping of the lower portion of her leg. This is quite unfortunate obviously we were hoping to get her into the lymphedema clinic they really do not seem to when I see her how if she is draining. Despite the fact this is really not wound related but more lymphedema weeping related. Nonetheless I do not know that this can be helpful for her to even go for that appointment since again I am not sure there is much that they would actually do at this  point. We may need to try a 4 layer compression wrap as best we can on her leg. She is on the Augmentin currently although I am still concerned about whether or not there could be potentially something going on infection wise I would obtain a culture though I understand is not the best being that is a surface culture I just 1 to make sure I do not seem to be missing anything. 03/27/2019 on evaluation today patient appears to be doing much better in regard to the left lower extremity compared to last week. Last week she had tremendous weeping which I think was subsequent to infection now she seems to be doing much better and very pleased. This is not completely healed but there is a lot of new skin growth and it has dried out quite a bit. Overall I think that  we are doing well with how things are moving along at this time. No fevers, chills, nausea, vomiting, or diarrhea. 04/03/2019 evaluation today patient appears to be doing a little worse this week compared to last time I saw her. I think this may be due to the fact that she is having issues with not being able to sleep in her bed at least not until last night. She is therefore been in a lift chair and subsequently has also had issues with not been able to use her pumps since she could not get in bed. With that being said the patient overall seems to be doing okay I do think I may want extend the antibiotic for a little bit longer at least until we can see if her edema and her weeping gets better and if it is then obviously I can always discontinue the antibiotics as of next week however I want her to continue to have it over the next week. 04/10/2019 on evaluation today patient unfortunately is still doing poorly with regard to her left lower extremity. Her right is all things considering doing fairly well. On the left however she continues to have spreading of the area of infection and weeping which appears to be even a larger surface area than  noted last week. She did have a positive culture for Pseudomonas in particular which seems to have been of concern she still has green/yellow discharge consistent with Pseudomonas and subsequently a tremendous amount of it. This has me obviously still concerned about the infection not really clearing up despite the fact that on culture it appears the Cipro should have been a good option for treating this. I think she may at this point need IV antibiotics since things are not doing better I do not want to get worse and cause sepsis. She is in agreement with the plan and believes as well that she likely does need to go to the hospital for IV vancomycin. Or something of the like depending on what the recommendation is from the ER. 04/17/2019 on evaluation today patient appears to be doing excellent in regard to her lower extremity on the left. She was in the hospital for several days from when I sent her last we saw her until just this past Tuesday. Fortunately her drainage is significantly improved and in fact is mostly clear. There is just a couple small areas that may still drain a little bit she states that the [facility] they prescribed for her at discharge she went picked up from pharmacy and got home but has not been able to find it since. She is looked everywhere. She is wondering if I will replace that for her today I will be more than happy to do that. 05/01/2019 on evaluation today patient actually appears to be doing quite well with regard to her lower extremities. She occasionally is having areas that will leak and then heal up mainly when a piece of the fibrotic skin pops off but fortunately she is not having any signs of active infection at this time. Overall she also really does not have any obvious weeping at this time. I do believe however she really needs some compression wraps and I think this may be a good time to get her back to the lymphedema clinic. 05/11/2019 on evaluation today  patient actually appears to be doing quite well with regard to her bilateral lower extremities. She occasionally will have a small area that we per another but in general seems  to be completely healed which is great news. Overall very pleased with how everything seems to be progressing. She does have her appointment with lymphedema clinic on November 18. 05/25/2019 on evaluation today patient appears to be doing well with regard to her left lower extremity. I am very pleased in this regard. In regard to her right leg this actually did start draining more I think it is mainly due to the fact that her leg is more swollen. I am not seeing any obvious signs of infection at this time although that is definitely something were obviously acutely aware of simply due to the fact that she had an issue not too far back with exactly this issue. Nonetheless I do feel like that lymphedema clinic would still be beneficial for her. I explained obviously if they are not able to do anything treatment wise on the right leg we could at least have them treat her left leg and then proceed from there. The patient is really in agreement with that plan. If they are able to do both as the drainage slows down that I would be happy to let them handle both. 06/01/2019 on evaluation today patient unfortunately appears to be doing worse with regard to her right lower extremity. The left lower extremity is still maintaining at this point. Unfortunately she has been having significantly increased pain over the past several days and has been experiencing as well increased swelling of the right lower extremity. I really do not know that I am seeing anything that appears to be obvious for infection at this point to be peripherally honest. With that being said the patient does seem to be having much more swelling that she is even experienced in the past and coupled with increased pain in her hip as well I am concerned that again she could  potentially have a DVT although I am not 100% sure of this. I think it something that may need to be checked out. We discussed the possibility of sending her for a DVT study through the hospital but unfortunately transportation is an issue if she does have a DVT I do not want her to wait days to be able to get in for that test however if she has this scheduled as an outpatient that is as fast that she will be able to get the test scheduled for transportation purposes. That will also fall on Thanksgiving so subsequently she did actually be looking at either Friday or even next week before we would know anything back from this. That is much too long in my opinion. Subsequent to the amount of discomfort she is experiencing the patient is actually okay with going to the ER for evaluation today. 06/12/2019 on evaluation today patient actually appears to be doing significantly better compared to last time I saw her. Following when I last saw her she was actually in the hospital from that Monday until the following Sunday almost 1 full week. She actually was placed on Keflex in the hospital following the time for her to be discharged and Dr. Steva Ready has recommended 2 times a day dosing of the Keflex for the next year in order to help with more prophylactic/preventative measures with regard to her developing cellulitis. Overall I think this sounds like an excellent plan. The patient unfortunately is good to have trouble being treated at lymphedema clinic due to the fact that she really cannot get up on the bed that they have there. They also state that they cannot  manage her as long as she has anything draining at this point. Obviously that is somewhat unfortunate as she does need help with edema control but nonetheless we will have to do what we can for her outside of it sounds like the lymphedema clinic scenario at this point. 06/19/2019 on evaluation today patient appears to be doing fairly well with  regard to her bilateral lower extremities. She is not nearly as swollen [name], [name] ([id number]) and shows no signs of infection at this point. There is no evidence of cellulitis whatsoever. She also has no open wounds or draining at this point which is also good news. No fever chills noted. She seems to be in very good spirits and in fact appears to be doing quite well. READMISSION 11/27/2019 This is a 64 year old woman that we have had in this clinic several times before including 2015, 16 and 19 and then most recently from 03/20/2019 through 06/19/2019 with bilateral lower extremity lymphedema. She has had previous arterial and reflux studies done years ago which were not all that remarkable. 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 She does have a positive family history and she had large legs starting may be in her 65s. She was recently in hospital from 10/20/2019 through 10/28/2019 with right leg cellulitis. She was given Ancef and clindamycin and then Zosyn when a culture showed Pseudomonas. At that time there was purulent drainage. She was followed by infectious disease Dr Steva Ready. The patient is now back at home. She has noted increased swelling in the right and no drainage in her right leg mostly on the posterior medial aspect in the calf area. She has not had pain or fever. She has literally been improved lysing above dressings because her at the area of this is far too large for standard compression. She has been wrapping the areas with sheets to resorptive pads. She is found these helped somewhat. She does have an appointment with the lymphedema clinic in [place] in late June. Past medical history includes bilateral lymphedema, hypertension, obstructive sleep apnea with CPAP. Recent hospitalization with apparently Pseudomonas cellulitis of the right lower leg 12/15/2019 upon evaluation today patient appears to be doing a  little bit worse in regard to her right lower extremity. Unfortunately she is having more weeping down in the lower portion of her leg. Fortunately there is no signs of active infection at this time. No fever chills noted. The patient states she is not having increased pain except for when she attempted to use the lymphedema pumps unfortunately she states that she did have pain when she did this. Otherwise we been using absorptive dressings of one type or another she is using diapers at home and then subsequently Ace wraps. In regard to the barrier cream we have discussed the possibility of derma cloud which she would like to try I do not have a problem with that. 12/22/2019 upon evaluation today patient actually appears to be doing better in regard to her leg ulcers at this point. Fortunately there does not appear to be any signs of active infection which is great news and I am extremely pleased with where things are progressing at this time. There is no sign of active infection currently. The patient is very pleased to see things doing so well. 12/29/2019 upon evaluation today patient appears to be doing a little bit better in regard to her weeping in general over her lower extremities. She does have  some signs of mild erythema little bit more than what I noted last week or rather last visit. Nonetheless I think that my threshold for switching her antibiotics from Keflex to something else is very low at this point considering that she has had such severe infections in the past that seem to come almost out of nowhere. There is a little erythema and warmth noted of the lower portion of her leg compared to the upper which also makes me want to go ahead and address things more rapidly at this point. Likely I would switch out the Keflex for something like Levaquin ideally. 7/16; patient with severe bilateral lymphedema. She has superficial wounds albeit almost circumferential now on the left lateral lower  leg. This may be new from last time. Small area on the right anterior lower leg and then another area on the right medial lower leg and of pannus fold. She has been using various absorptive garments. She states she is using her compression pumps once a day occasionally twice. Culture from her last visit here was negative 01/29/2020 on evaluation today patient appears to be doing excellent at this point in regard to her legs with regard to infection I see no signs of active infection at this point. She still does have unfortunately areas of weeping this is minimal on the right now her left is actually significantly worse although I do not think it is as bad as last week with Dr. Dellia Nims saw her. She has been trying to pump and elevate her legs is much as possible. She has previously been on the Keflex and in the past for prevention that seems to do fairly well and likely can extend that today. 02/04/2020 on evaluation today patient appears to be doing better in regard to her legs bilaterally. Fortunately there is no signs of active infection at this time which is great news and overall she has less weeping on the left compared to the right and there is several spots where she is pretty much sealed up with no draining regions. Overall very pleased in this regard. 02/19/2020 on evaluation today patient appears to be doing very well in regard to her wounds currently. Fortunately there is no evidence of active infection overall very pleased with where things stand. She is significantly improved in regard to her edema I am extremely pleased in this regard she tells me that the popping no longer hurts and in fact she actually looks forward to it. 03/04/2020 on evaluation today patient appears to be doing excellent in regard to her lower extremities. Fortunately there is no signs of active infection at this time. No fevers, chills, nausea, vomiting, or diarrhea. 03/25/2020 on evaluation today patient appears to be  doing a little bit more poorly in regard to her legs at this point. She tells me that she is still continue to have issues with drainage and this has been a little bit worse she was getting ready to start taking the Keflex again but wanted to see me first. Fortunately there is no signs of active infection at this time. No fevers, chills, nausea, vomiting, or diarrhea. 04/15/2020 upon evaluation today patient appears to be doing somewhat poorly in regard to her right leg. She tells me she has been having more pain she has been taking the Keflex that was previously prescribed unfortunately that just does not seem to help with this. She was hoping that the pain on her right was actually coming from the fact that she was  having issues with her wrap having gotten caught in her recliner. With that being said she tells me that she knew something was not right. Currently her right leg is warm to touch along with being erythematous all the way up to around at least mid thigh as far as I can see. The left leg does not appear to be doing that badly though there is increased weeping around the ankle region. 05/06/2020 on evaluation today patient appears to be doing much better than last time I saw her. She did go to the hospital where she was admitted for 2 days and treated with antibiotic therapy. She was discharged with antibiotics as well and has done extremely well. I am extremely pleased with where things stand today. There is no signs of active infection at this time which is great news. [name], [name] ([id number]) Objective Constitutional Well-nourished and well-hydrated in no acute distress. Vitals Time Taken: 10:12 AM, Height: 63 in, Weight: 370 lbs, BMI: 65.5, Temperature: 98.4 F, Pulse: 76 bpm, Respiratory Rate: 18 breaths/min, Blood Pressure: 136/83 mmHg. Respiratory normal breathing without difficulty. Psychiatric this patient is able to make decisions and demonstrates good insight into  disease process. Alert and Oriented x 3. pleasant and cooperative. General Notes: Upon inspection patient's wound bed actually showed signs of good Epithelization currently there does not appear to be any evidence of active infection which is great news and overall I am extremely pleased with where things stand. 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 Follow-up Appointments: Return Appointment in 1 month Edema Control: Elevate legs to the level of the heart and pump ankles as often as possible Compression Pump: Use compression pump on left lower extremity for 60 minutes, twice daily. Compression Pump: Use compression pump on right lower extremity for 60 minutes, twice daily. The following medication(s) was prescribed: Levaquin oral 750 mg tablet 1 1 tablet oral taken 1 time per day for 14 days. starting 05/09/2020 gentamicin topical 0.1 % cream cream topical applied daily to the open wounds on both legs with dressing changes until healed and infection resolves starting 05/09/2020 1. I would recommend at this point that we go and continue with the wound care measures as before and the patient is in agreement with that plan. This includes the Chux pads or else other absorptive dressings that she is using at home. These tend to work best for her fortunately she is not draining as much as she was. 2. We are going to try Unna paste over the open areas see if we help dry this up in the past that is done well for her we will see how it does  this go around. We will see patient back for reevaluation in 4 weeks here in the clinic. If anything worsens or changes patient will contact our office for additional recommendations. 05/09/2020 patient actually contacted my office today due to the fact that she was having over the weekend green drainage after I saw her at the end of the week. She tells me that this is consistent with what she has had previous when she had  Pseudomonas and to be honest considering the amount of time she has had infection I trust her judgment in this regard. For that reason I am going to send in a prescription for Harrietta, Tool. ([id number]) along with topical gentamicin for her that was sent to the pharmacy this morning. If she is not getting better she will contact the office and let me know. At that point we will obviously need to get her into be seen. Electronic Signature(s) Signed: 05/09/2020 9:24:46 AM By: Worthy Keeler PA-C Previous Signature: 05/06/2020 3:46:33 PM Version By: Worthy Keeler PA-C Entered By: Worthy Keeler on 05/09/2020 09:24:45 [name], [name] ([id number]) -------------------------------------------------------------------------------- SuperBill Details Patient Name: [name] Date of Service: 05/06/2020 Medical Record Number: [id number] Patient Account Number: [id number] Date of Birth/Sex: 1955/09/04 (64 y.o. F) Treating RN: Cornell Barman Primary Care Provider: Aretta Nip Other Clinician: Referring Provider: Aretta Nip Treating Provider/Extender: Skipper Cliche in Treatment: 23 Diagnosis Coding ICD-10 Codes Code Description Q82.0 Hereditary lymphedema L97.811 Non-pressure chronic ulcer of other part of right lower leg limited to breakdown of skin L97.821 Non-pressure chronic ulcer of other part of left lower leg limited to breakdown of skin Facility Procedures CPT4 Code: 93818299 Description: 99213 - WOUND CARE VISIT-LEV 3 EST  PT Modifier: Quantity: 1 Physician Procedures CPT4 Code: 3716967 Description: 89381 - WC PHYS LEVEL 3 - EST PT Modifier: Quantity: 1 CPT4 Code: Description: ICD-10 Diagnosis Description Q82.0 Hereditary lymphedema L97.811 Non-pressure chronic ulcer of other part of right lower leg limited to bre L97.821 Non-pressure chronic ulcer of other part of left lower leg limited to brea Modifier: akdown of skin kdown of skin Quantity: Electronic Signature(s) Signed: 05/06/2020 3:46:48 PM By: Worthy Keeler PA-C Entered By: Worthy Keeler on 05/06/2020 15:46:48

## 2020-05-10 NOTE — Progress Notes (Signed)
[name], [name] ([id number]) Visit Report for 05/06/2020 Arrival Information Details Patient Name: [name], [name]. Date of Service: 05/06/2020 10:00 AM Medical Record Number: [id number] Patient Account Number: [id number] Date of Birth/Sex: April 30, 1956 (64 y.o. F) Treating RN: Dolan Amen Primary Care Daviyon Widmayer: Aretta Nip Other Clinician: Referring Ousmane Seeman: Aretta Nip Treating Esteven Overfelt/Extender: Skipper Cliche in Treatment: 23 Visit Information History Since Last Visit Pain Present Now: Yes Patient Arrived: Cane Arrival Time: 10:08 Accompanied By: self Transfer Assistance: None Patient Identification Verified: Yes Secondary Verification Process Completed: Yes Patient Requires Transmission-Based Precautions: No Patient Has Alerts: No Electronic Signature(s) Signed: 05/10/2020 8:59:48 AM By: Georges Mouse, Minus Breeding Entered By: Georges Mouse, Minus Breeding on 05/06/2020 10:12:39 [name] ([id number]) -------------------------------------------------------------------------------- Clinic Level of Care Assessment Details Patient Name: [name]. Date of Service: 05/06/2020 10:00 AM Medical Record Number: [id number] Patient Account Number: [id number] Date of Birth/Sex: March 10, 1956 (64 y.o. F) Treating RN: Cornell Barman Primary Care Lamarr Feenstra: Aretta Nip Other Clinician: Referring Jennings Stirling: Aretta Nip Treating Parker Wherley/Extender: Skipper Cliche in Treatment: 23 Clinic Level of Care Assessment Items TOOL 4 Quantity Score []  - Use when only an EandM is performed on FOLLOW-UP visit 0 ASSESSMENTS - Nursing Assessment / Reassessment X - Reassessment of Co-morbidities (includes updates in patient status) 1 10 X- 1 5 Reassessment of Adherence to Treatment Plan ASSESSMENTS - Wound and Skin Assessment / Reassessment X - Simple Wound Assessment / Reassessment - one wound 1 5 []  - 0 Complex Wound Assessment / Reassessment - multiple wounds []  -  0 Dermatologic / Skin Assessment (not related to wound area) ASSESSMENTS - Focused Assessment []  - Circumferential Edema Measurements - multi extremities 0 []  - 0 Nutritional Assessment / Counseling / Intervention []  - 0 Lower Extremity Assessment (monofilament, tuning fork, pulses) []  - 0 Peripheral Arterial Disease Assessment (using hand held doppler) ASSESSMENTS - Ostomy and/or Continence Assessment and Care []  - Incontinence Assessment and Management 0 []  - 0 Ostomy Care Assessment and Management (repouching, etc.) PROCESS - Coordination of Care X - Simple Patient / Family Education for ongoing care 1 15 []  - 0 Complex (extensive) Patient / Family Education for ongoing care []  - 0 Staff obtains Programmer, systems, Records, Test Results / Process Orders []  - 0 Staff telephones HHA, Nursing Homes / Clarify orders / etc []  - 0 Routine Transfer to another Facility (non-emergent condition) []  - 0 Routine Hospital Admission (non-emergent condition) []  - 0 New Admissions / Biomedical engineer / Ordering NPWT, Apligraf, etc. []  - 0 Emergency Hospital Admission (emergent condition) X- 1 10 Simple Discharge Coordination []  - 0 Complex (extensive) Discharge Coordination PROCESS - Special Needs []  - Pediatric / Minor Patient Management 0 []  - 0 Isolation Patient Management []  - 0 Hearing / Language / Visual special needs []  - 0 Assessment of Community assistance (transportation, D/C planning, etc.) []  - 0 Additional assistance / Altered mentation []  - 0 Support Surface(s) Assessment (bed, cushion, seat, etc.) INTERVENTIONS - Wound Cleansing / Measurement [name], [name] ([id number]) []  - 0 Simple Wound Cleansing - one wound []  - 0 Complex Wound Cleansing - multiple wounds X- 1 5 Wound Imaging (photographs - any number of wounds) []  - 0 Wound Tracing (instead of photographs) []  - 0 Simple Wound Measurement - one wound []  - 0 Complex Wound Measurement - multiple  wounds INTERVENTIONS - Wound Dressings []  - Small Wound Dressing one or multiple wounds 0 []  - 0 Medium Wound Dressing one or multiple wounds X- 2 20 Large Wound Dressing one  or multiple wounds []  - 0 Application of Medications - topical []  - 0 Application of Medications - injection INTERVENTIONS - Miscellaneous []  - External ear exam 0 []  - 0 Specimen Collection (cultures, biopsies, blood, body fluids, etc.) []  - 0 Specimen(s) / Culture(s) sent or taken to Lab for analysis []  - 0 Patient Transfer (multiple staff / Civil Service fast streamer / Similar devices) []  - 0 Simple Staple / Suture removal (25 or less) []  - 0 Complex Staple / Suture removal (26 or more) []  - 0 Hypo / Hyperglycemic Management (close monitor of Blood Glucose) []  - 0 Ankle / Brachial Index (ABI) - do not check if billed separately X- 1 5 Vital Signs Has the patient been seen at the hospital within the last three years: Yes Total Score: 95 Level Of Care: New/Established - Level 3 Electronic Signature(s) Signed: 05/06/2020 5:20:45 PM By: Gretta Cool, BSN, RN, CWS, Kim RN, BSN Entered By: Gretta Cool, BSN, RN, CWS, Kim on 05/06/2020 13:36:41 [name], [name] ([id number]) -------------------------------------------------------------------------------- Lower Extremity Assessment Details Patient Name: [name], [name] Date of Service: 05/06/2020 10:00 AM Medical Record Number: [id number] Patient Account Number: [id number] Date of Birth/Sex: 1956/03/30 (64 y.o. F) Treating RN: Dolan Amen Primary Care Ileanna Gemmill: Aretta Nip Other Clinician: Referring Sourish Allender: Aretta Nip Treating Ryane Canavan/Extender: Skipper Cliche in Treatment: 23 Edema Assessment Assessed: [Left: No] [Right: No] [Left: Edema] [Right: :] Calf Left: Right: Point of Measurement: 32 cm From Medial Instep 87 cm 81 cm Ankle Left: Right: Point of Measurement: 9 cm From Medial Instep 43.5 cm 43.5 cm Vascular Assessment Pulses: Dorsalis  Pedis Palpable: [Left:Yes] [Right:Yes] Electronic Signature(s) Signed: 05/10/2020 8:59:48 AM By: Georges Mouse, Minus Breeding Entered By: Georges Mouse, Minus Breeding on 05/06/2020 10:32:49 [name] ([id number]) -------------------------------------------------------------------------------- Multi Wound Chart Details Patient Name: [name]. Date of Service: 05/06/2020 10:00 AM Medical Record Number: [id number] Patient Account Number: [id number] Date of Birth/Sex: Nov 11, 1955 (64 y.o. F) Treating RN: Cornell Barman Primary Care Olivia Pavelko: Aretta Nip Other Clinician: Referring Charrisse Masley: Aretta Nip Treating Marico Buckle/Extender: Skipper Cliche in Treatment: 23 Vital Signs Height(in): 63 Pulse(bpm): 76 Weight(lbs): 370 Blood Pressure(mmHg): 136/83 Body Mass Index(BMI): 66 Temperature(F): 98.4 Respiratory Rate(breaths/min): 18 Wound Assessments Treatment Notes Electronic Signature(s) Signed: 05/06/2020 5:20:45 PM By: Gretta Cool, BSN, RN, CWS, Kim RN, BSN Entered By: Gretta Cool, BSN, RN, CWS, Kim on 05/06/2020 10:36:51 [name] ([id number]) -------------------------------------------------------------------------------- Multi-Disciplinary Care Plan Details Patient Name: [name], LUKES. Date of Service: 05/06/2020 10:00 AM Medical Record Number: [id number] Patient Account Number: [id number] Date of Birth/Sex: [birth year] (64 y.o. F) Treating RN: Cornell Barman Primary Care Nilo Fallin: Aretta Nip Other Clinician: Referring Kaydee Magel: Aretta Nip Treating Letisha Yera/Extender: Skipper Cliche in Treatment: 63 Active Inactive Abuse / Safety / Falls / Self Care Management Nursing Diagnoses: Potential for falls Goals: Patient will remain injury free related to falls Date Initiated: 11/27/2019 Target Resolution Date: 02/13/2020 Goal Status: Active Interventions: Assess fall risk on admission and as needed Notes: Orientation to the Wound Care Program Nursing  Diagnoses: Knowledge deficit related to the wound healing center program Goals: Patient/caregiver will verbalize understanding of the [facility] Date Initiated: 11/27/2019 Target Resolution Date: 02/13/2020 Goal Status: Active Interventions: Provide education on orientation to the wound center Notes: Venous Leg Ulcer Nursing Diagnoses: Potential for venous Insuffiency (use before diagnosis confirmed) Goals: Patient will maintain optimal edema control Date Initiated: 11/27/2019 Target Resolution Date: 02/13/2020 Goal Status: Active Interventions: Compression as ordered Notes: Electronic Signature(s) Signed: 05/06/2020 5:20:45 PM By: Gretta Cool, BSN, RN, CWS, Kim RN, BSN  Entered By: Gretta Cool, BSN, RN, CWS, Kim on 05/06/2020 10:36:45 [name] ([id number]) -------------------------------------------------------------------------------- Pain Assessment Details Patient Name: [name], [name]. Date of Service: 05/06/2020 10:00 AM Medical Record Number: [id number] Patient Account Number: [id number] Date of Birth/Sex: 1956-03-28 (64 y.o. F) Treating RN: Dolan Amen Primary Care Joy Haegele: Aretta Nip Other Clinician: Referring Nattaly Yebra: Aretta Nip Treating Baran Kuhrt/Extender: Skipper Cliche in Treatment: 23 Active Problems Location of Pain Severity and Description of Pain Patient Has Paino Yes Site Locations Pain Location: Generalized Pain With Dressing Change: Yes Rate the pain. Current Pain Level: 4 Character of Pain Describe the Pain: Throbbing Pain Management and Medication Current Pain Management: Rest: Yes Notes c/o 4/10 throbbing pain on left leg after wrapping leg this morning Electronic Signature(s) Signed: 05/10/2020 8:59:48 AM By: Georges Mouse, Minus Breeding Entered By: Georges Mouse, Minus Breeding on 05/06/2020 10:16:58 [name]  ([id number]) -------------------------------------------------------------------------------- Patient/Caregiver Education Details Patient Name: [name]. Date of Service: 05/06/2020 10:00 AM Medical Record Number: [id number] Patient Account Number: [id number] Date of Birth/Gender: 1955-11-14 (64 y.o. F) Treating RN: Cornell Barman Primary Care Physician: Aretta Nip Other Clinician: Referring Physician: Aretta Nip Treating Physician/Extender: Skipper Cliche in Treatment: 23 Education Assessment Education Provided To: Patient Education Topics Provided Wound/Skin Impairment: Handouts: Caring for Your Ulcer Methods: Demonstration, Explain/Verbal Responses: State content correctly Electronic Signature(s) Signed: 05/06/2020 5:20:45 PM By: Gretta Cool, BSN, RN, CWS, Kim RN, BSN Entered By: Gretta Cool, BSN, RN, CWS, Kim on 05/06/2020 13:36:59 [name], [name] ([id number]) -------------------------------------------------------------------------------- Vitals Details Patient Name: [name] Date of Service: 05/06/2020 10:00 AM Medical Record Number: [id number] Patient Account Number: [id number] Date of Birth/Sex: 12-Apr-1956 (64 y.o. F) Treating RN: Dolan Amen Primary Care Azoria Abbett: Aretta Nip Other Clinician: Referring Elija Mccamish: Aretta Nip Treating Marieliz Strang/Extender: Skipper Cliche in Treatment: 23 Vital Signs Time Taken: 10:12 Temperature (F): 98.4 Height (in): 63 Pulse (bpm): 76 Weight (lbs): 370 Respiratory Rate (breaths/min): 18 Body Mass Index (BMI): 65.5 Blood Pressure (mmHg): 136/83 Reference Range: 80 - 120 mg / dl Electronic Signature(s) Signed: 05/10/2020 8:59:48 AM By: Georges Mouse, Minus Breeding Entered By: Georges Mouse, Minus Breeding on 05/06/2020 10:13:26

## 2020-05-26 NOTE — Progress Notes (Signed)
Patient was no-show for appointment.  The office staff will contact the patient for rescheduling follow-up. 

## 2020-05-27 ENCOUNTER — Other Ambulatory Visit: Payer: Self-pay

## 2020-05-27 ENCOUNTER — Encounter: Payer: Medicaid Other | Attending: Physician Assistant | Admitting: Physician Assistant

## 2020-05-27 DIAGNOSIS — E11622 Type 2 diabetes mellitus with other skin ulcer: Secondary | ICD-10-CM | POA: Insufficient documentation

## 2020-05-27 DIAGNOSIS — L97811 Non-pressure chronic ulcer of other part of right lower leg limited to breakdown of skin: Secondary | ICD-10-CM | POA: Insufficient documentation

## 2020-05-27 DIAGNOSIS — I890 Lymphedema, not elsewhere classified: Secondary | ICD-10-CM | POA: Insufficient documentation

## 2020-05-27 DIAGNOSIS — G4733 Obstructive sleep apnea (adult) (pediatric): Secondary | ICD-10-CM | POA: Diagnosis not present

## 2020-05-27 DIAGNOSIS — L97821 Non-pressure chronic ulcer of other part of left lower leg limited to breakdown of skin: Secondary | ICD-10-CM | POA: Diagnosis not present

## 2020-05-27 DIAGNOSIS — I10 Essential (primary) hypertension: Secondary | ICD-10-CM | POA: Insufficient documentation

## 2020-05-27 NOTE — Progress Notes (Signed)
KADEISHA, BETSCH ([id number]) Visit Report for 05/27/2020 Arrival Information Details Patient Name: [name], [name]. Date of Service: 05/27/2020 10:00 AM Medical Record Number: [id number] Patient Account Number: [id number] Date of Birth/Sex: 03-27-1956 (64 y.o. F) Treating RN: Dolan Amen Primary Care Kizzie Cotten: Tandy Gaw Other Clinician: Referring Lyell Clugston: Aretta Nip Treating Jaelin Devincentis/Extender: Skipper Cliche in Treatment: 26 Visit Information History Since Last Visit Pain Present Now: Yes Patient Arrived: Cane Arrival Time: 10:01 Accompanied By: self Transfer Assistance: None Patient Requires Transmission-Based Precautions: No Patient Has Alerts: No Electronic Signature(s) Signed: 05/27/2020 11:57:34 AM By: Georges Mouse, Minus Breeding Entered By: Georges Mouse, Minus Breeding on 05/27/2020 10:05:02 [name] ([id number]) -------------------------------------------------------------------------------- Lower Extremity Assessment Details Patient Name: [name]. Date of Service: 05/27/2020 10:00 AM Medical Record Number: [id number] Patient Account Number: [id number] Date of Birth/Sex: [birth year] (64 y.o. F) Treating RN: Dolan Amen Primary Care Adie Vilar: Tandy Gaw Other Clinician: Referring Candice Lunney: Aretta Nip Treating Atira Borello/Extender: Skipper Cliche in Treatment: 26 Edema Assessment Assessed: [Left: Yes] [Right: Yes] Edema: [Left: Yes] [Right: Yes] Calf Left: Right: Point of Measurement: 32 cm From Medial Instep 85 cm 86.4 cm Ankle Left: Right: Point of Measurement: 9 cm From Medial Instep 52.5 cm 48.8 cm Vascular Assessment Pulses: Dorsalis Pedis Palpable: [Left:Yes] [Right:Yes] Electronic Signature(s) Signed: 05/27/2020 11:57:34 AM By: Georges Mouse, Minus Breeding Entered By: Georges Mouse, Minus Breeding on 05/27/2020 10:13:49 Krikorian, [name]  ([id number]) -------------------------------------------------------------------------------- Multi Wound Chart Details Patient Name: [name]. Date of Service: 05/27/2020 10:00 AM Medical Record Number: [id number] Patient Account Number: [id number] Date of Birth/Sex: January 26, 1956 (64 y.o. F) Treating RN: Dolan Amen Primary Care Krystle Polcyn: Tandy Gaw Other Clinician: Referring Jamaar Howes: Aretta Nip Treating Briaunna Grindstaff/Extender: Skipper Cliche in Treatment: 26 Vital Signs Height(in): 63 Pulse(bpm): 68 Weight(lbs): 370 Blood Pressure(mmHg): 121/77 Body Mass Index(BMI): 66 Temperature(F): 98.2 Respiratory Rate(breaths/min): 18 Wound Assessments Treatment Notes Electronic Signature(s) Signed: 05/27/2020 11:57:34 AM By: Georges Mouse, Minus Breeding Entered By: Georges Mouse, Minus Breeding on 05/27/2020 10:21:32 [name] ([id number]) -------------------------------------------------------------------------------- Multi-Disciplinary Care Plan Details Patient Name: [name]. Date of Service: 05/27/2020 10:00 AM Medical Record Number: [id number] Patient Account Number: [id number] Date of Birth/Sex: 15-Oct-1955 (64 y.o. F) Treating RN: Dolan Amen Primary Care Tranice Laduke: Tandy Gaw Other Clinician: Referring Devlin Brink: Aretta Nip Treating Thursa Emme/Extender: Skipper Cliche in Treatment: 12 Active Inactive Abuse / Safety / Falls / Self Care Management Nursing Diagnoses: Potential for falls Goals: Patient will remain injury free related to falls Date Initiated: 11/27/2019 Target Resolution Date: 02/13/2020 Goal Status: Active Interventions: Assess fall risk on admission and as needed Notes: Orientation to the Wound Care Program Nursing Diagnoses: Knowledge deficit related to the wound healing center program Goals: Patient/caregiver will verbalize understanding of the [facility] Date Initiated: 11/27/2019 Target Resolution Date:  02/13/2020 Goal Status: Active Interventions: Provide education on orientation to the wound center Notes: Venous Leg Ulcer Nursing Diagnoses: Potential for venous Insuffiency (use before diagnosis confirmed) Goals: Patient will maintain optimal edema control Date Initiated: 11/27/2019 Target Resolution Date: 02/13/2020 Goal Status: Active Interventions: Compression as ordered Notes: Electronic Signature(s) Signed: 05/27/2020 11:57:34 AM By: Georges Mouse, Minus Breeding Entered By: Georges Mouse, Minus Breeding on 05/27/2020 10:21:22 [name] ([id number]) -------------------------------------------------------------------------------- Non-Wound Condition Assessment Details Patient Name: [name]. Date of Service: 05/27/2020 10:00 AM Medical Record Number: [id number] Patient Account Number: [id number] Date of Birth/Sex: 04-20-1956 (64 y.o. F) Treating RN: Dolan Amen Primary Care Kayman Snuffer: Tandy Gaw Other Clinician: Referring Mohmmad Saleeby: Aretta Nip Treating Kalin Kyler/Extender: Skipper Cliche in Treatment: 26 Non-Wound  Condition: Condition: Lymphedema Location: Leg Side: Right Notes: Right leg with decreased drainage this visit. Photos Electronic Signature(s) Signed: 05/27/2020 10:17:07 AM By: Georges Mouse, Minus Breeding Entered By: Georges Mouse, Minus Breeding on 05/27/2020 10:17:06 [name] ([id number]) -------------------------------------------------------------------------------- Non-Wound Condition Assessment Details Patient Name: [name], [name] Date of Service: 05/27/2020 10:00 AM Medical Record Number: [id number] Patient Account Number: [id number] Date of Birth/Sex: 1956/03/04 (64 y.o. F) Treating RN: Dolan Amen Primary Care Addaline Peplinski: Tandy Gaw Other Clinician: Referring Kalden Wanke: Aretta Nip Treating Shyenne Maggard/Extender: Skipper Cliche in Treatment: 26 Non-Wound Condition: Condition: Lymphedema Location: Leg Side: Left Notes:  decreased drainage this visit. Photos Electronic Signature(s) Signed: 05/27/2020 10:17:33 AM By: Georges Mouse, Minus Breeding Entered By: Georges Mouse, Minus Breeding on 05/27/2020 10:17:33 Stirewalt, [name] ([id number]) -------------------------------------------------------------------------------- Pain Assessment Details Patient Name: [name], [name]. Date of Service: 05/27/2020 10:00 AM Medical Record Number: [id number] Patient Account Number: [id number] Date of Birth/Sex: 12-28-1955 (64 y.o. F) Treating RN: Dolan Amen Primary Care Cyrilla Durkin: Tandy Gaw Other Clinician: Referring Miski Feldpausch: Aretta Nip Treating Nobel Brar/Extender: Skipper Cliche in Treatment: 26 Active Problems Location of Pain Severity and Description of Pain Patient Has Paino Yes Site Locations Pain Location: Generalized Pain Rate the pain. Current Pain Level: 3 Pain Management and Medication Current Pain Management: Electronic Signature(s) Signed: 05/27/2020 11:57:34 AM By: Georges Mouse, Ken[place] Entered By: Georges Mouse, Minus Breeding on 05/27/2020 10:06:56 Windle, [name] ([id number]) -------------------------------------------------------------------------------- Vitals Details Patient Name: [name]. Date of Service: 05/27/2020 10:00 AM Medical Record Number: [id number] Patient Account Number: [id number] Date of Birth/Sex: 09/27/1955 (64 y.o. F) Treating RN: Dolan Amen Primary Care Lasheena Frieze: Tandy Gaw Other Clinician: Referring Hanadi Stanly: Aretta Nip Treating Tajia Szeliga/Extender: Skipper Cliche in Treatment: 26 Vital Signs Time Taken: 10:06 Temperature (F): 98.2 Height (in): 63 Pulse (bpm): 68 Weight (lbs): 370 Respiratory Rate (breaths/min): 18 Body Mass Index (BMI): 65.5 Blood Pressure (mmHg): 121/77 Reference Range: 80 - 120 mg / dl Electronic Signature(s) Signed: 05/27/2020 11:57:34 AM By: Georges Mouse, Minus Breeding Entered By: Georges Mouse, Minus Breeding on 05/27/2020  10:06:41

## 2020-05-27 NOTE — Progress Notes (Addendum)
TYLIE, GOLONKA ([id number]) Visit Report for 05/27/2020 Chief Complaint Document Details Patient Name: [name], [name]. Date of Service: 05/27/2020 10:00 AM Medical Record Number: [id number] Patient Account Number: [id number] Date of Birth/Sex: [birth year] (64 y.o. F) Treating RN: Cornell Barman Primary Care Provider: Tandy Gaw Other Clinician: Referring Provider: Aretta Nip Treating Provider/Extender: Skipper Cliche in Treatment: 26 Information Obtained from: Patient Chief Complaint Bilateral LE lymphedema Electronic Signature(s) Signed: 05/27/2020 10:14:39 AM By: Worthy Keeler PA-C Entered By: Worthy Keeler on 05/27/2020 10:14:38 [place], [name] ([id number]) -------------------------------------------------------------------------------- HPI Details Patient Name: [name] Date of Service: 05/27/2020 10:00 AM Medical Record Number: [id number] Patient Account Number: [id number] Date of Birth/Sex: 14-Jan-1956 (64 y.o. F) Treating RN: Cornell Barman Primary Care Provider: Tandy Gaw Other Clinician: Referring Provider: Aretta Nip Treating Provider/Extender: Skipper Cliche in Treatment: 26 History of Present Illness HPI Description: The patient is a 64 year old female with history of hypertension and a long-standing history of bilateral lower extremity lymphedema (first presented on 4/2) . She has had open ulcers in the past which have always responded to compression therapy. She had briefly been to a lymphedema clinic in the past which helped her at the time. this time around she stopped treatment of her lymphedema pumps approximately 2 weeks ago because of some pain in the knees and then noticed the right leg getting worse. She was seen by her PCP who put her on clindamycin 4 times a day 2 days ago. The patient has seen [facility] and Dr. Delana Meyer had seen her last year where a vascular study including venous and arterial duplex studies were within normal  limits. he had recommended compression stockings and lymphedema pumps and the patient has been using this in about 2 weeks ago. She is known to be diabetic but in the past few time she's gone to her primary care doctor her hemoglobin A1c has been normal. 02/11/2015 - after her last visit she took my advice and went to the ER regarding the progressive cellulitis of her right lower extremity and she was admitted between July 17 and 22nd. She received IV antibiotics and then was sent home on a course of steroid-induced and oral antibiotics. She has improved much since then. 02/17/2015 -- she has been doing fine and the weeping of her legs has remarkably gone down. She has no fresh issues. READMISSION 01/15/18 This patient was given this clinic before most recently in 2016 seen by Dr. Con Memos. She has massive bilateral lymphedema and over the last 2 months this had weeping edema out of the left leg. She has compression pumps but her compliance with these has been minimal. She has [facility] they've been using TCA/ABDs/kerlix under an Ace wrap.she has had recent problems with cellulitis. She was apparently seen in the ER and 12/23/17 and given clindamycin. She was then followed by her primary doctor and given doxycycline and Keflex. The pain seems to have settled down. In April 2018 the patient had arterial studies done at [facility]. This showed triphasic waveforms throughout the right leg and mostly triphasic waveforms on the left except for monophasic at the posterior tibial artery distally. She was not felt to have evidence of right lower extremity arterial stenosis or significant problems on the left side. 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 She also had a right lower extremity venous Doppler in January 2018 this was limited by the patient's body habitus and lymphedema. Most of the proximal veins  were not visualized The patient presents with an  area of denuded skin on the anterior medial part of the left calf. There is weeping edema fluid here. 01/22/18; the patient has somewhat better edema control using her compression pumps twice a day and as a result she has much better epithelialization on the left anterior calf area. Only a small open area remains. 01/29/18; the patient has been compliant with her compression pumps. Both the areas on her calf that healed. The remaining area on the left anterior leg is fully epithelialized Readmission: 02/20/2019 upon evaluation today patient presents for reevaluation due to issues that she is having with the bilateral lower extremities. She actually has wounds open on both legs. On the right she has an area in the crease of her leg on the right around the knee region which is actually draining quite a bit and actually has some fungal type appearance to it. She has been on nystatin powder that seems to have helped to some degree. In regard to the left lower extremity this is actually in the lower portion of her leg closer to the ankle and again is continuing to drain as well unfortunately. There does not appear to be any signs of active infection at this time which is good news. No fevers, chills, nausea, vomiting, or diarrhea. She tells me that since she was seen last year she is actually been doing quite well for the most part with regard to her lower extremities. Unfortunately she now is experiencing a little bit more drainage at this time. She is concerned about getting this under control so that it does not get significantly worse. 02/27/2019 on evaluation today patient appears to be doing somewhat better in regard to her bilateral lower extremity wounds. She has been tolerating the dressing changes without complication. Fortunately there is no signs of active infection at this point. No fevers, chills, nausea, vomiting, or diarrhea. She did get her dressing supplies which is excellent news she was  extremely excited to get these. She also got paperwork from [employer] for their financial assistance program where they may be able to help her out in the future if needed with supplies at discounted prices. 03/06/2019 on evaluation today patient appears to be doing a little worse with regard to both areas of weeping on her bilateral lower extremities. This is around the right medial knee and just above the left ankle. With that being said she is unfortunately not doing as well as I would like to see. I feel like she may need to potentially go see someone at the lymphedema clinic as the wraps that she needs or even beyond what we can do here at the wound care center. She really does not have wounds she just has open areas of weeping that are causing some difficulty for her. Subsequently because of this and the moisture I am concerned about the potential for infection I am going to likely give her a prophylactic antibiotic today, Keflex, just to be on the safe side. Nonetheless again there is no obvious signs of active infection at this time. 03/13/2019 on evaluation today patient appears to be doing well with regard to her bilateral lower extremities where she has been weeping compared to even last week's evaluation. I see some areas of new skin growth which is excellent and overall I am very pleased with how things seem to be progressing. No fevers, chills, nausea, vomiting, or diarrhea. [name], [name] ([id number]) 03/20/2019 on evaluation today patient  unfortunately is continuing to have issues with significant edema of the left lower extremity. Her right side seems to be doing much better. Unfortunately her left side is showing increased weeping of the lower portion of her leg. This is quite unfortunate obviously we were hoping to get her into the lymphedema clinic they really do not seem to when I see her how if she is draining. Despite the fact this is really not wound related but more lymphedema  weeping related. Nonetheless I do not know that this can be helpful for her to even go for that appointment since again I am not sure there is much that they would actually do at this point. We may need to try a 4 layer compression wrap as best we can on her leg. She is on the Augmentin currently although I am still concerned about whether or not there could be potentially something going on infection wise I would obtain a culture though I understand is not the best being that is a surface culture I just 1 to make sure I do not seem to be missing anything. 03/27/2019 on evaluation today patient appears to be doing much better in regard to the left lower extremity compared to last week. Last week she had tremendous weeping which I think was subsequent to infection now she seems to be doing much better and very pleased. This is not completely healed but there is a lot of new skin growth and it has dried out quite a bit. Overall I think that we are doing well with how things are moving along at this time. No fevers, chills, nausea, vomiting, or diarrhea. 04/03/2019 evaluation today patient appears to be doing a little worse this week compared to last time I saw her. I think this may be due to the fact that she is having issues with not being able to sleep in her bed at least not until last night. She is therefore been in a lift chair and subsequently has also had issues with not been able to use her pumps since she could not get in bed. With that being said the patient overall seems to be doing okay I do think I may want extend the antibiotic for a little bit longer at least until we can see if her edema and her weeping gets better and if it is then obviously I can always discontinue the antibiotics as of next week however I want her to continue to have it over the next week. 04/10/2019 on evaluation today patient unfortunately is still doing poorly with regard to her left lower extremity. Her right is all  things considering doing fairly well. On the left however she continues to have spreading of the area of infection and weeping which appears to be even a larger surface area than noted last week. She did have a positive culture for Pseudomonas in particular which seems to have been of concern she still has green/yellow discharge consistent with Pseudomonas and subsequently a tremendous amount of it. This has me obviously still concerned about the infection not really clearing up despite the fact that on culture it appears the Cipro should have been a good option for treating this. I think she may at this point need IV antibiotics since things are not doing better I do not want to get worse and cause sepsis. She is in agreement with the plan and believes as well that she likely does need to go to the hospital for IV vancomycin.  Or something of the like depending on what the recommendation is from the ER. 04/17/2019 on evaluation today patient appears to be doing excellent in regard to her lower extremity on the left. She was in the hospital for several days from when I sent her last we saw her until just this past Tuesday. Fortunately her drainage is significantly improved and in fact is mostly clear. There is just a couple small areas that may still drain a little bit she states that the [facility] they prescribed for her at discharge she went picked up from pharmacy and got home but has not been able to find it since. She is looked everywhere. She is wondering if I will replace that for her today I will be more than happy to do that. 05/01/2019 on evaluation today patient actually appears to be doing quite well with regard to her lower extremities. She occasionally is having areas that will leak and then heal up mainly when a piece of the fibrotic skin pops off but fortunately she is not having any signs of active infection at this time. Overall she also really does not have any obvious weeping at this  time. I do believe however she really needs some compression wraps and I think this may be a good time to get her back to the lymphedema clinic. 05/11/2019 on evaluation today patient actually appears to be doing quite well with regard to her bilateral lower extremities. She occasionally will have a small area that we per another but in general seems to be completely healed which is great news. Overall very pleased with how everything seems to be progressing. She does have her appointment with lymphedema clinic on November 18. 05/25/2019 on evaluation today patient appears to be doing well with regard to her left lower extremity. I am very pleased in this regard. In regard to her right leg this actually did start draining more I think it is mainly due to the fact that her leg is more swollen. I am not seeing any obvious signs of infection at this time although that is definitely something were obviously acutely aware of simply due to the fact that she had an issue not too far back with exactly this issue. Nonetheless I do feel like that lymphedema clinic would still be beneficial for her. I explained obviously if they are not able to do anything treatment wise on the right leg we could at least have them treat her left leg and then proceed from there. The patient is really in agreement with that plan. If they are able to do both as the drainage slows down that I would be happy to let them handle both. 06/01/2019 on evaluation today patient unfortunately appears to be doing worse with regard to her right lower extremity. The left lower extremity is still maintaining at this point. Unfortunately she has been having significantly increased pain over the past several days and has been experiencing as well increased swelling of the right lower extremity. I really do not know that I am seeing anything that appears to be obvious for infection at this point to be peripherally honest. With that being said the  patient does seem to be having much more swelling that she is even experienced in the past and coupled with increased pain in her hip as well I am concerned that again she could potentially have a DVT although I am not 100% sure of this. I think it something that may need to be checked  out. We discussed the possibility of sending her for a DVT study through the hospital but unfortunately transportation is an issue if she does have a DVT I do not want her to wait days to be able to get in for that test however if she has this scheduled as an outpatient that is as fast that she will be able to get the test scheduled for transportation purposes. That will also fall on Thanksgiving so subsequently she did actually be looking at either Friday or even next week before we would know anything back from this. That is much too long in my opinion. Subsequent to the amount of discomfort she is experiencing the patient is actually okay with going to the ER for evaluation today. 06/12/2019 on evaluation today patient actually appears to be doing significantly better compared to last time I saw her. Following when I last saw her she was actually in the hospital from that Monday until the following Sunday almost 1 full week. She actually was placed on Keflex in the hospital following the time for her to be discharged and Dr. Steva Ready has recommended 2 times a day dosing of the Keflex for the next year in order to help with more prophylactic/preventative measures with regard to her developing cellulitis. Overall I think this sounds like an excellent plan. The patient unfortunately is good to have trouble being treated at lymphedema clinic due to the fact that she really cannot get up on the bed that they have there. They also state that they cannot manage her as long as she has anything draining at this point. Obviously that is somewhat unfortunate as she does need help with edema control but nonetheless we will have  to do what we can for her outside of it sounds like the lymphedema clinic scenario at this point. 06/19/2019 on evaluation today patient appears to be doing fairly well with regard to her bilateral lower extremities. She is not nearly as swollen and shows no signs of infection at this point. There is no evidence of cellulitis whatsoever. She also has no open wounds or draining at this point which is also good news. No fever chills noted. She seems to be in very good spirits and in fact appears to be doing quite well. READMISSION 11/27/2019 [name], [name] ([id number]) This is a 64 year old woman that we have had in this clinic several times before including 2015, 16 and 19 and then most recently from 03/20/2019 through 06/19/2019 with bilateral lower extremity lymphedema. She has had previous arterial and reflux studies done years ago which were not all that remarkable. 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 She does have a positive family history and she had large legs starting may be in her 52s. She was recently in hospital from 10/20/2019 through 10/28/2019 with right leg cellulitis. She was given Ancef and clindamycin and then Zosyn when a culture showed Pseudomonas. At that time there was purulent drainage. She was followed by infectious disease Dr Steva Ready. The patient is now back at home. She has noted increased swelling in the right and no drainage in her right leg mostly on the posterior medial aspect in the calf area. She has not had pain or fever. She has literally been improved lysing above dressings because her at the area of this is far too large for standard compression. She has been wrapping the areas with sheets to resorptive pads. She is found these helped somewhat. She  does have an appointment with the lymphedema clinic in [place] in late June. Past medical history includes bilateral lymphedema, hypertension,  obstructive sleep apnea with CPAP. Recent hospitalization with apparently Pseudomonas cellulitis of the right lower leg 12/15/2019 upon evaluation today patient appears to be doing a little bit worse in regard to her right lower extremity. Unfortunately she is having more weeping down in the lower portion of her leg. Fortunately there is no signs of active infection at this time. No fever chills noted. The patient states she is not having increased pain except for when she attempted to use the lymphedema pumps unfortunately she states that she did have pain when she did this. Otherwise we been using absorptive dressings of one type or another she is using diapers at home and then subsequently Ace wraps. In regard to the barrier cream we have discussed the possibility of derma cloud which she would like to try I do not have a problem with that. 12/22/2019 upon evaluation today patient actually appears to be doing better in regard to her leg ulcers at this point. Fortunately there does not appear to be any signs of active infection which is great news and I am extremely pleased with where things are progressing at this time. There is no sign of active infection currently. The patient is very pleased to see things doing so well. 12/29/2019 upon evaluation today patient appears to be doing a little bit better in regard to her weeping in general over her lower extremities. She does have some signs of mild erythema little bit more than what I noted last week or rather last visit. Nonetheless I think that my threshold for switching her antibiotics from Keflex to something else is very low at this point considering that she has had such severe infections in the past that seem to come almost out of nowhere. There is a little erythema and warmth noted of the lower portion of her leg compared to the upper which also makes me want to go ahead and address things more rapidly at this point. Likely I would switch out  the Keflex for something like Levaquin ideally. 7/16; patient with severe bilateral lymphedema. She has superficial wounds albeit almost circumferential now on the left lateral lower leg. This may be new from last time. Small area on the right anterior lower leg and then another area on the right medial lower leg and of pannus fold. She has been using various absorptive garments. She states she is using her compression pumps once a day occasionally twice. Culture from her last visit here was negative 01/29/2020 on evaluation today patient appears to be doing excellent at this point in regard to her legs with regard to infection I see no signs of active infection at this point. She still does have unfortunately areas of weeping this is minimal on the right now her left is actually significantly worse although I do not think it is as bad as last week with Dr. Dellia Nims saw her. She has been trying to pump and elevate her legs is much as possible. She has previously been on the Keflex and in the past for prevention that seems to do fairly well and likely can extend that today. 02/04/2020 on evaluation today patient appears to be doing better in regard to her legs bilaterally. Fortunately there is no signs of active infection at this time which is great news and overall she has less weeping on the left compared to the right and  there is several spots where she is pretty much sealed up with no draining regions. Overall very pleased in this regard. 02/19/2020 on evaluation today patient appears to be doing very well in regard to her wounds currently. Fortunately there is no evidence of active infection overall very pleased with where things stand. She is significantly improved in regard to her edema I am extremely pleased in this regard she tells me that the popping no longer hurts and in fact she actually looks forward to it. 03/04/2020 on evaluation today patient appears to be doing excellent in regard to her  lower extremities. Fortunately there is no signs of active infection at this time. No fevers, chills, nausea, vomiting, or diarrhea. 03/25/2020 on evaluation today patient appears to be doing a little bit more poorly in regard to her legs at this point. She tells me that she is still continue to have issues with drainage and this has been a little bit worse she was getting ready to start taking the Keflex again but wanted to see me first. Fortunately there is no signs of active infection at this time. No fevers, chills, nausea, vomiting, or diarrhea. 04/15/2020 upon evaluation today patient appears to be doing somewhat poorly in regard to her right leg. She tells me she has been having more pain she has been taking the Keflex that was previously prescribed unfortunately that just does not seem to help with this. She was hoping that the pain on her right was actually coming from the fact that she was having issues with her wrap having gotten caught in her recliner. With that being said she tells me that she knew something was not right. Currently her right leg is warm to touch along with being erythematous all the way up to around at least mid thigh as far as I can see. The left leg does not appear to be doing that badly though there is increased weeping around the ankle region. 05/06/2020 on evaluation today patient appears to be doing much better than last time I saw her. She did go to the hospital where she was admitted for 2 days and treated with antibiotic therapy. She was discharged with antibiotics as well and has done extremely well. I am extremely pleased with where things stand today. There is no signs of active infection at this time which is great news. 05/27/2020 upon evaluation today patient appears to be doing well with regard to her lower extremities bilaterally. She has just a very tiny area on the right leg which is opening on the left leg she is significantly improved though she still  has several areas that do appear to be open this is minimal compared to what is been in the past. In general I am extremely pleased with where things stand today. The patient does tell me she is not been using her pumps quite as much as she should be. I do believe that is 1 area she can definitely work on. She has had a lot going on including a Covid exposure and apparently also a outbreak of likely shingles. Electronic Signature(s) Signed: 05/27/2020 1:47:52 PM By: Worthy Keeler PA-C Entered By: Worthy Keeler on 05/27/2020 13:47:52 [name], [name] ([id number]) [name], [name] ([id number]) -------------------------------------------------------------------------------- Physical Exam Details Patient Name: [name], [name]. Date of Service: 05/27/2020 10:00 AM Medical Record Number: [id number] Patient Account Number: [id number] Date of Birth/Sex: [birth year] (64 y.o. F) Treating RN: Cornell Barman Primary Care Provider: Tandy Gaw Other Clinician: Referring Provider:  Schmucker, Symerton Treating Provider/Extender: Jeri Cos Weeks in Treatment: 26 Constitutional Obese and well-hydrated in no acute distress. Respiratory normal breathing without difficulty. Psychiatric this patient is able to make decisions and demonstrates good insight into disease process. Alert and Oriented x 3. pleasant and cooperative. Notes Upon inspection patient's wound bed actually showed signs of good granulation at this time. There seems to be excellent epithelization and overall I feel like she is healing quite nicely in regard to both lower extremities. I see no major issues here at this point. Electronic Signature(s) Signed: 05/27/2020 1:48:07 PM By: Worthy Keeler PA-C Entered By: Worthy Keeler on 05/27/2020 13:48:07 Splinter, [name] ([id number]) -------------------------------------------------------------------------------- Physician Orders Details Patient Name: [name] Date of Service:  05/27/2020 10:00 AM Medical Record Number: [id number] Patient Account Number: [id number] Date of Birth/Sex: Nov 15, 1955 (64 y.o. 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 Follow-up Appointments o Return Appointment in 1 month Edema Control o Elevate legs to the level of the heart and pump ankles as often as possible o Compression Pump: Use compression pump on left lower extremity for 60 minutes, twice daily. o Compression Pump: Use compression pump on right lower extremity for 60 minutes, twice daily. o Other: - Ace wraps Electronic Signature(s) Signed: 05/27/2020 11:57:34 AM By: Georges Mouse, Minus Breeding Signed: 05/27/2020 4:40:14 PM By: Worthy Keeler PA-C Entered By: Georges Mouse, Minus Breeding on 05/27/2020 10:23:28 [name] ([id number]) -------------------------------------------------------------------------------- Problem List Details Patient Name: [name], [name]. Date of Service: 05/27/2020 10:00 AM Medical Record Number: [id number] Patient Account Number: [id number] Date of Birth/Sex: 1955/10/07 (64 y.o. 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 Date of Service: 05/27/2020 10:00 AM Medical Record Number: [id number] Patient Account Number: [id number] Date of Birth/Sex: 11-16-1955 (64 y.o. F) Treating RN: Cornell Barman Primary Care Provider: Tandy Gaw Other Clinician: Referring Provider: Aretta Nip Treating Provider/Extender: Skipper Cliche in Treatment: 26 Subjective Chief Complaint Information obtained from Patient Bilateral LE lymphedema History of Present Illness (HPI) The patient is a 64 year old female with history of hypertension and a long-standing history of bilateral lower extremity lymphedema (first presented on 4/2) . She has had open ulcers in the past which have always responded to compression therapy. She had briefly been to a lymphedema clinic in the past which helped her at the  time. this time around she stopped treatment of her lymphedema pumps approximately 2 weeks ago because of some pain in the knees and then noticed the right leg getting worse. She was seen by her PCP who put her on clindamycin 4 times a day 2 days ago. The patient has seen [facility] and Dr. Delana Meyer had seen her last year where a vascular study including venous and arterial duplex studies were within normal limits. he had recommended compression stockings and  lymphedema pumps and the patient has been using this in about 2 weeks ago. She is known to be diabetic but in the past few time she's gone to her primary care doctor her hemoglobin A1c has been normal. 02/11/2015 - after her last visit she took my advice and went to the ER regarding the progressive cellulitis of her right lower extremity and she was admitted between July 17 and 22nd. She received IV antibiotics and then was sent home on a course of steroid-induced and oral antibiotics. She has improved much since then. 02/17/2015 -- she has been doing fine and the weeping of her legs has remarkably gone down. She has no fresh issues. READMISSION 01/15/18 This patient was given this clinic before most recently in 2016 seen by Dr. Con Memos. She has massive bilateral lymphedema and over the last 2 months this had weeping edema out of the left leg. She has compression pumps but her compliance with these has been minimal. She has [facility] they've been using TCA/ABDs/kerlix under an Ace wrap.she has had recent problems with cellulitis. She was apparently seen in the ER and 12/23/17 and given clindamycin. She was then followed by her primary doctor and given doxycycline and Keflex. The pain seems to have settled down. In April 2018 the patient had arterial studies done at [facility]. This showed triphasic waveforms throughout the right leg and mostly triphasic waveforms on the left except for monophasic at the posterior tibial artery distally. She was not felt to have evidence of right lower extremity arterial stenosis or significant problems on the left side. 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 She also had a right lower extremity venous Doppler in January 2018 this was limited by the patient's body habitus and lymphedema. Most of the proximal veins were not visualized The patient presents with an area of denuded skin on the anterior medial part of  the left calf. There is weeping edema fluid here. 01/22/18; the patient has somewhat better edema control using her compression pumps twice a day and as a result she has much better epithelialization on the left anterior calf area. Only a small open area remains. 01/29/18; the patient has been compliant with her compression pumps. Both the areas on her calf that healed. The remaining area on the left anterior leg is fully epithelialized Readmission: 02/20/2019 upon evaluation today patient presents for reevaluation due to issues that she is having with the bilateral lower extremities. She actually has wounds open on both legs. On the right she has an area in the crease of her leg on the right around the knee region which is actually draining quite a bit and actually has some fungal type appearance to it. She has been on nystatin powder that seems to have helped to some degree. In regard to the left lower extremity this is actually in the lower portion of her leg closer to the ankle and again is continuing to drain as well unfortunately. There does not appear  to be any signs of active infection at this time which is good news. No fevers, chills, nausea, vomiting, or diarrhea. She tells me that since she was seen last year she is actually been doing quite well for the most part with regard to her lower extremities. Unfortunately she now is experiencing a little bit more drainage at this time. She is concerned about getting this under control so that it does not get significantly worse. 02/27/2019 on evaluation today patient appears to be doing somewhat better in regard to her bilateral lower extremity wounds. She has been tolerating the dressing changes without complication. Fortunately there is no signs of active infection at this point. No fevers, chills, nausea, vomiting, or diarrhea. She did get her dressing supplies which is excellent news she was extremely excited to get these. She also got  paperwork from [employer] for their financial assistance program where they may be able to help her out in the future if needed with supplies at discounted prices. 03/06/2019 on evaluation today patient appears to be doing a little worse with regard to both areas of weeping on her bilateral lower extremities. This is around the right medial knee and just above the left ankle. With that being said she is unfortunately not doing as well as I would like to see. I feel like she may need to potentially go see someone at the lymphedema clinic as the wraps that she needs or even beyond what we can do here at the wound care center. She really does not have wounds she just has open areas of weeping that are causing some difficulty for her. Subsequently because of this and the moisture I am concerned about the potential for infection I am going to likely give her a prophylactic antibiotic today, Keflex, just to be on the safe side. Nonetheless again there is no obvious signs of active infection at this time. [name], [name] ([id number]) 03/13/2019 on evaluation today patient appears to be doing well with regard to her bilateral lower extremities where she has been weeping compared to even last week's evaluation. I see some areas of new skin growth which is excellent and overall I am very pleased with how things seem to be progressing. No fevers, chills, nausea, vomiting, or diarrhea. 03/20/2019 on evaluation today patient unfortunately is continuing to have issues with significant edema of the left lower extremity. Her right side seems to be doing much better. Unfortunately her left side is showing increased weeping of the lower portion of her leg. This is quite unfortunate obviously we were hoping to get her into the lymphedema clinic they really do not seem to when I see her how if she is draining. Despite the fact this is really not wound related but more lymphedema weeping related. Nonetheless I do not know that  this can be helpful for her to even go for that appointment since again I am not sure there is much that they would actually do at this point. We may need to try a 4 layer compression wrap as best we can on her leg. She is on the Augmentin currently although I am still concerned about whether or not there could be potentially something going on infection wise I would obtain a culture though I understand is not the best being that is a surface culture I just 1 to make sure I do not seem to be missing anything. 03/27/2019 on evaluation today patient appears to be doing much better in regard to the  left lower extremity compared to last week. Last week she had tremendous weeping which I think was subsequent to infection now she seems to be doing much better and very pleased. This is not completely healed but there is a lot of new skin growth and it has dried out quite a bit. Overall I think that we are doing well with how things are moving along at this time. No fevers, chills, nausea, vomiting, or diarrhea. 04/03/2019 evaluation today patient appears to be doing a little worse this week compared to last time I saw her. I think this may be due to the fact that she is having issues with not being able to sleep in her bed at least not until last night. She is therefore been in a lift chair and subsequently has also had issues with not been able to use her pumps since she could not get in bed. With that being said the patient overall seems to be doing okay I do think I may want extend the antibiotic for a little bit longer at least until we can see if her edema and her weeping gets better and if it is then obviously I can always discontinue the antibiotics as of next week however I want her to continue to have it over the next week. 04/10/2019 on evaluation today patient unfortunately is still doing poorly with regard to her left lower extremity. Her right is all things considering doing fairly well. On the  left however she continues to have spreading of the area of infection and weeping which appears to be even a larger surface area than noted last week. She did have a positive culture for Pseudomonas in particular which seems to have been of concern she still has green/yellow discharge consistent with Pseudomonas and subsequently a tremendous amount of it. This has me obviously still concerned about the infection not really clearing up despite the fact that on culture it appears the Cipro should have been a good option for treating this. I think she may at this point need IV antibiotics since things are not doing better I do not want to get worse and cause sepsis. She is in agreement with the plan and believes as well that she likely does need to go to the hospital for IV vancomycin. Or something of the like depending on what the recommendation is from the ER. 04/17/2019 on evaluation today patient appears to be doing excellent in regard to her lower extremity on the left. She was in the hospital for several days from when I sent her last we saw her until just this past Tuesday. Fortunately her drainage is significantly improved and in fact is mostly clear. There is just a couple small areas that may still drain a little bit she states that the [facility] they prescribed for her at discharge she went picked up from pharmacy and got home but has not been able to find it since. She is looked everywhere. She is wondering if I will replace that for her today I will be more than happy to do that. 05/01/2019 on evaluation today patient actually appears to be doing quite well with regard to her lower extremities. She occasionally is having areas that will leak and then heal up mainly when a piece of the fibrotic skin pops off but fortunately she is not having any signs of active infection at this time. Overall she also really does not have any obvious weeping at this time. I do believe however she  really needs  some compression wraps and I think this may be a good time to get her back to the lymphedema clinic. 05/11/2019 on evaluation today patient actually appears to be doing quite well with regard to her bilateral lower extremities. She occasionally will have a small area that we per another but in general seems to be completely healed which is great news. Overall very pleased with how everything seems to be progressing. She does have her appointment with lymphedema clinic on November 18. 05/25/2019 on evaluation today patient appears to be doing well with regard to her left lower extremity. I am very pleased in this regard. In regard to her right leg this actually did start draining more I think it is mainly due to the fact that her leg is more swollen. I am not seeing any obvious signs of infection at this time although that is definitely something were obviously acutely aware of simply due to the fact that she had an issue not too far back with exactly this issue. Nonetheless I do feel like that lymphedema clinic would still be beneficial for her. I explained obviously if they are not able to do anything treatment wise on the right leg we could at least have them treat her left leg and then proceed from there. The patient is really in agreement with that plan. If they are able to do both as the drainage slows down that I would be happy to let them handle both. 06/01/2019 on evaluation today patient unfortunately appears to be doing worse with regard to her right lower extremity. The left lower extremity is still maintaining at this point. Unfortunately she has been having significantly increased pain over the past several days and has been experiencing as well increased swelling of the right lower extremity. I really do not know that I am seeing anything that appears to be obvious for infection at this point to be peripherally honest. With that being said the patient does seem to be having much more  swelling that she is even experienced in the past and coupled with increased pain in her hip as well I am concerned that again she could potentially have a DVT although I am not 100% sure of this. I think it something that may need to be checked out. We discussed the possibility of sending her for a DVT study through the hospital but unfortunately transportation is an issue if she does have a DVT I do not want her to wait days to be able to get in for that test however if she has this scheduled as an outpatient that is as fast that she will be able to get the test scheduled for transportation purposes. That will also fall on Thanksgiving so subsequently she did actually be looking at either Friday or even next week before we would know anything back from this. That is much too long in my opinion. Subsequent to the amount of discomfort she is experiencing the patient is actually okay with going to the ER for evaluation today. 06/12/2019 on evaluation today patient actually appears to be doing significantly better compared to last time I saw her. Following when I last saw her she was actually in the hospital from that Monday until the following Sunday almost 1 full week. She actually was placed on Keflex in the hospital following the time for her to be discharged and Dr. Steva Ready has recommended 2 times a day dosing of the Keflex for the next year in order  to help with more prophylactic/preventative measures with regard to her developing cellulitis. Overall I think this sounds like an excellent plan. The patient unfortunately is good to have trouble being treated at lymphedema clinic due to the fact that she really cannot get up on the bed that they have there. They also state that they cannot manage her as long as she has anything draining at this point. Obviously that is somewhat unfortunate as she does need help with edema control but nonetheless we will have to do what we can for her outside of it  sounds like the lymphedema clinic scenario at this point. 06/19/2019 on evaluation today patient appears to be doing fairly well with regard to her bilateral lower extremities. She is not nearly as swollen [name], [name] ([id number]) and shows no signs of infection at this point. There is no evidence of cellulitis whatsoever. She also has no open wounds or draining at this point which is also good news. No fever chills noted. She seems to be in very good spirits and in fact appears to be doing quite well. READMISSION 11/27/2019 This is a 64 year old woman that we have had in this clinic several times before including 2015, 16 and 19 and then most recently from 03/20/2019 through 06/19/2019 with bilateral lower extremity lymphedema. She has had previous arterial and reflux studies done years ago which were not all that remarkable. 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 She does have a positive family history and she had large legs starting may be in her 31s. She was recently in hospital from 10/20/2019 through 10/28/2019 with right leg cellulitis. She was given Ancef and clindamycin and then Zosyn when a culture showed Pseudomonas. At that time there was purulent drainage. She was followed by infectious disease Dr Steva Ready. The patient is now back at home. She has noted increased swelling in the right and no drainage in her right leg mostly on the posterior medial aspect in the calf area. She has not had pain or fever. She has literally been improved lysing above dressings because her at the area of this is far too large for standard compression. She has been wrapping the areas with sheets to resorptive pads. She is found these helped somewhat. She does have an appointment with the lymphedema clinic in [place] in late June. Past medical history includes bilateral lymphedema, hypertension, obstructive sleep apnea with CPAP. Recent  hospitalization with apparently Pseudomonas cellulitis of the right lower leg 12/15/2019 upon evaluation today patient appears to be doing a little bit worse in regard to her right lower extremity. Unfortunately she is having more weeping down in the lower portion of her leg. Fortunately there is no signs of active infection at this time. No fever chills noted. The patient states she is not having increased pain except for when she attempted to use the lymphedema pumps unfortunately she states that she did have pain when she did this. Otherwise we been using absorptive dressings of one type or another she is using diapers at home and then subsequently Ace wraps. In regard to the barrier cream we have discussed the possibility of derma cloud which she would like to try I do not have a problem with that. 12/22/2019 upon evaluation today patient actually appears to be doing better in regard to her leg ulcers at this point. Fortunately there does not appear to be any signs of active infection which is great news and I  am extremely pleased with where things are progressing at this time. There is no sign of active infection currently. The patient is very pleased to see things doing so well. 12/29/2019 upon evaluation today patient appears to be doing a little bit better in regard to her weeping in general over her lower extremities. She does have some signs of mild erythema little bit more than what I noted last week or rather last visit. Nonetheless I think that my threshold for switching her antibiotics from Keflex to something else is very low at this point considering that she has had such severe infections in the past that seem to come almost out of nowhere. There is a little erythema and warmth noted of the lower portion of her leg compared to the upper which also makes me want to go ahead and address things more rapidly at this point. Likely I would switch out the Keflex for something like Levaquin  ideally. 7/16; patient with severe bilateral lymphedema. She has superficial wounds albeit almost circumferential now on the left lateral lower leg. This may be new from last time. Small area on the right anterior lower leg and then another area on the right medial lower leg and of pannus fold. She has been using various absorptive garments. She states she is using her compression pumps once a day occasionally twice. Culture from her last visit here was negative 01/29/2020 on evaluation today patient appears to be doing excellent at this point in regard to her legs with regard to infection I see no signs of active infection at this point. She still does have unfortunately areas of weeping this is minimal on the right now her left is actually significantly worse although I do not think it is as bad as last week with Dr. Dellia Nims saw her. She has been trying to pump and elevate her legs is much as possible. She has previously been on the Keflex and in the past for prevention that seems to do fairly well and likely can extend that today. 02/04/2020 on evaluation today patient appears to be doing better in regard to her legs bilaterally. Fortunately there is no signs of active infection at this time which is great news and overall she has less weeping on the left compared to the right and there is several spots where she is pretty much sealed up with no draining regions. Overall very pleased in this regard. 02/19/2020 on evaluation today patient appears to be doing very well in regard to her wounds currently. Fortunately there is no evidence of active infection overall very pleased with where things stand. She is significantly improved in regard to her edema I am extremely pleased in this regard she tells me that the popping no longer hurts and in fact she actually looks forward to it. 03/04/2020 on evaluation today patient appears to be doing excellent in regard to her lower extremities. Fortunately there is  no signs of active infection at this time. No fevers, chills, nausea, vomiting, or diarrhea. 03/25/2020 on evaluation today patient appears to be doing a little bit more poorly in regard to her legs at this point. She tells me that she is still continue to have issues with drainage and this has been a little bit worse she was getting ready to start taking the Keflex again but wanted to see me first. Fortunately there is no signs of active infection at this time. No fevers, chills, nausea, vomiting, or diarrhea. 04/15/2020 upon evaluation today patient appears to  be doing somewhat poorly in regard to her right leg. She tells me she has been having more pain she has been taking the Keflex that was previously prescribed unfortunately that just does not seem to help with this. She was hoping that the pain on her right was actually coming from the fact that she was having issues with her wrap having gotten caught in her recliner. With that being said she tells me that she knew something was not right. Currently her right leg is warm to touch along with being erythematous all the way up to around at least mid thigh as far as I can see. The left leg does not appear to be doing that badly though there is increased weeping around the ankle region. 05/06/2020 on evaluation today patient appears to be doing much better than last time I saw her. She did go to the hospital where she was admitted for 2 days and treated with antibiotic therapy. She was discharged with antibiotics as well and has done extremely well. I am extremely pleased with where things stand today. There is no signs of active infection at this time which is great news. 05/27/2020 upon evaluation today patient appears to be doing well with regard to her lower extremities bilaterally. She has just a very tiny area on the right leg which is opening on the left leg she is significantly improved though she still has several areas that do appear to be  open this is minimal compared to what is been in the past. In general I am extremely pleased with where things stand today. The patient does tell me she is not been using her pumps quite as much as she should be. I do believe that is 1 area she can definitely work on. She has had a lot going on including a Covid exposure and apparently also a outbreak of likely shingles. [name], [name] ([id number]) Objective Constitutional Obese and well-hydrated in no acute distress. Vitals Time Taken: 10:06 AM, Height: 63 in, Weight: 370 lbs, BMI: 65.5, Temperature: 98.2 F, Pulse: 68 bpm, Respiratory Rate: 18 breaths/min, Blood Pressure: 121/77 mmHg. Respiratory normal breathing without difficulty. Psychiatric this patient is able to make decisions and demonstrates good insight into disease process. Alert and Oriented x 3. pleasant and cooperative. General Notes: Upon inspection patient's wound bed actually showed signs of good granulation at this time. There seems to be excellent epithelization and overall I feel like she is healing quite nicely in regard to both lower extremities. I see no major issues here at this point. Other Condition(s) Patient presents with Lymphedema located on the Right Leg. Patient presents with Lymphedema located on the Left Leg. 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 Follow-up Appointments: Return Appointment in 1 month Edema Control: Elevate legs to the level of the heart and pump ankles as often as possible Compression Pump: Use compression pump on left  lower extremity for 60 minutes, twice daily. Compression Pump: Use compression pump on right lower extremity for 60 minutes, twice daily. Other: - Ace wraps 1. Would recommend at this time that we have the patient continue to monitor for  any signs of worsening infection. Obviously if she develops any issues she should contact the office let me know as soon as possible. 2. Also can recommend that she continue to elevate her legs as well as use the lymphedema pumps as frequently as possible. 3. I am also can recommend at this time that the patient continue to sleep in her bed which allows her to also and elevate her legs which I think is of utmost importance. KRISTYNA, BRADSTREET ([id number]) We will see patient back for reevaluation in 1 Month here in the clinic. If anything worsens or changes patient will contact our office for additional recommendations. Electronic Signature(s) Signed: 05/27/2020 1:49:50 PM By: Worthy Keeler PA-C Entered By: Worthy Keeler on 05/27/2020 13:49:50 [name], [name] ([id number]) -------------------------------------------------------------------------------- SuperBill Details Patient Name: [name] Date of Service: 05/27/2020 Medical Record Number: [id number] Patient Account Number: [id number] Date of Birth/Sex: 1955-12-26 (64 y.o. F) Treating RN: Cornell Barman Primary Care Provider: Tandy Gaw Other Clinician: Referring Provider: Aretta Nip Treating Provider/Extender: Skipper Cliche in Treatment: 26 Diagnosis Coding ICD-10 Codes Code Description Q82.0 Hereditary lymphedema L97.811 Non-pressure chronic ulcer of other part of right lower leg limited to breakdown of skin L97.821 Non-pressure chronic ulcer of other part of left lower leg limited to breakdown of skin Facility Procedures CPT4 Code: 35825189 Description: 99214 - WOUND CARE VISIT-LEV 4 EST PT Modifier: Quantity: 1 Physician Procedures CPT4 Code: 8421031 Description: 28118  - WC PHYS LEVEL 3 - EST PT Modifier: Quantity: 1 CPT4 Code: Description: ICD-10 Diagnosis Description Q82.0 Hereditary lymphedema L97.811 Non-pressure chronic ulcer of other part of right lower leg limited to bre L97.821 Non-pressure chronic ulcer of other part of left lower leg limited to brea Modifier: akdown of skin kdown of skin Quantity: Electronic Signature(s) Signed: 05/27/2020 5:10:25 PM By: Gretta Cool, BSN, RN, CWS, Kim RN, BSN Previous Signature: 05/27/2020 1:50:07 PM Version By: Worthy Keeler PA-C Entered By: Gretta Cool, BSN, RN, CWS, Kim on 05/27/2020 17:10:24

## 2020-06-24 ENCOUNTER — Encounter: Payer: Medicaid Other | Attending: Physician Assistant | Admitting: Physician Assistant

## 2020-06-24 ENCOUNTER — Other Ambulatory Visit
Admission: RE | Admit: 2020-06-24 | Discharge: 2020-06-24 | Disposition: A | Discharge status: Home / Self Care | Payer: Medicaid Other | Source: Ambulatory Visit | Attending: Physician Assistant | Admitting: Physician Assistant

## 2020-06-24 ENCOUNTER — Other Ambulatory Visit: Payer: Self-pay

## 2020-06-24 DIAGNOSIS — I10 Essential (primary) hypertension: Secondary | ICD-10-CM | POA: Insufficient documentation

## 2020-06-24 DIAGNOSIS — G4733 Obstructive sleep apnea (adult) (pediatric): Secondary | ICD-10-CM | POA: Diagnosis not present

## 2020-06-24 DIAGNOSIS — I890 Lymphedema, not elsewhere classified: Secondary | ICD-10-CM | POA: Insufficient documentation

## 2020-06-24 DIAGNOSIS — L97811 Non-pressure chronic ulcer of other part of right lower leg limited to breakdown of skin: Secondary | ICD-10-CM | POA: Insufficient documentation

## 2020-06-24 DIAGNOSIS — E11622 Type 2 diabetes mellitus with other skin ulcer: Secondary | ICD-10-CM | POA: Insufficient documentation

## 2020-06-24 DIAGNOSIS — L97821 Non-pressure chronic ulcer of other part of left lower leg limited to breakdown of skin: Secondary | ICD-10-CM | POA: Diagnosis not present

## 2020-06-24 DIAGNOSIS — L089 Local infection of the skin and subcutaneous tissue, unspecified: Secondary | ICD-10-CM | POA: Insufficient documentation

## 2020-06-24 NOTE — Progress Notes (Addendum)
[name], [name] ([id number]) Visit Report for 06/24/2020 Chief Complaint Document Details Patient Name: [name], [name]. Date of Service: 06/24/2020 10:00 AM Medical Record Number: [id number] Patient Account Number: [id number] Date of Birth/Sex: [birth year] (64 y.o. F) Treating RN: Cornell Barman Primary Care Provider: Tandy Gaw Other Clinician: Referring Provider: Tandy Gaw Treating Provider/Extender: Skipper Cliche in Treatment: 30 Information Obtained from: Patient Chief Complaint Bilateral LE lymphedema Electronic Signature(s) Signed: 06/24/2020 10:36:09 AM By: Worthy Keeler PA-C Entered By: Worthy Keeler on 06/24/2020 10:36:08 [name] ([id number]) -------------------------------------------------------------------------------- HPI Details Patient Name: [name] Date of Service: 06/24/2020 10:00 AM Medical Record Number: [id number] Patient Account Number: [id number] Date of Birth/Sex: 11/03/1955 (64 y.o. F) Treating RN: Cornell Barman Primary Care Provider: Tandy Gaw Other Clinician: Referring Provider: Tandy Gaw Treating Provider/Extender: Skipper Cliche in Treatment: 33 History of Present Illness HPI Description: The patient is a 64 year old female with history of hypertension and a long-standing history of bilateral lower extremity lymphedema (first presented on 4/2) . She has had open ulcers in the past which have always responded to compression therapy. She had briefly been to a lymphedema clinic in the past which helped her at the time. this time around she stopped treatment of her lymphedema pumps approximately 2 weeks ago because of some pain in the knees and then noticed the right leg getting worse. She was seen by her PCP who put her on clindamycin 4 times a day 2 days ago. The patient has seen [facility] and Dr. Delana Meyer had seen her last year where a vascular study including venous and arterial duplex studies were within normal  limits. he had recommended compression stockings and lymphedema pumps and the patient has been using this in about 2 weeks ago. She is known to be diabetic but in the past few time she's gone to her primary care doctor her hemoglobin A1c has been normal. 02/11/2015 - after her last visit she took my advice and went to the ER regarding the progressive cellulitis of her right lower extremity and she was admitted between July 17 and 22nd. She received IV antibiotics and then was sent home on a course of steroid-induced and oral antibiotics. She has improved much since then. 02/17/2015 -- she has been doing fine and the weeping of her legs has remarkably gone down. She has no fresh issues. READMISSION 01/15/18 This patient was given this clinic before most recently in 2016 seen by Dr. Con Memos. She has massive bilateral lymphedema and over the last 2 months this had weeping edema out of the left leg. She has compression pumps but her compliance with these has been minimal. She has [facility] they've been using TCA/ABDs/kerlix under an Ace wrap.she has had recent problems with cellulitis. She was apparently seen in the ER and 12/23/17 and given clindamycin. She was then followed by her primary doctor and given doxycycline and Keflex. The pain seems to have settled down. In April 2018 the patient had arterial studies done at [facility]. This showed triphasic waveforms throughout the right leg and mostly triphasic waveforms on the left except for monophasic at the posterior tibial artery distally. She was not felt to have evidence of right lower extremity arterial stenosis or significant problems on the left side. 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 She also had a right lower extremity venous Doppler in January 2018 this was limited by the patient's body habitus and lymphedema. Most of the proximal veins  were not visualized The patient presents with an  area of denuded skin on the anterior medial part of the left calf. There is weeping edema fluid here. 01/22/18; the patient has somewhat better edema control using her compression pumps twice a day and as a result she has much better epithelialization on the left anterior calf area. Only a small open area remains. 01/29/18; the patient has been compliant with her compression pumps. Both the areas on her calf that healed. The remaining area on the left anterior leg is fully epithelialized Readmission: 02/20/2019 upon evaluation today patient presents for reevaluation due to issues that she is having with the bilateral lower extremities. She actually has wounds open on both legs. On the right she has an area in the crease of her leg on the right around the knee region which is actually draining quite a bit and actually has some fungal type appearance to it. She has been on nystatin powder that seems to have helped to some degree. In regard to the left lower extremity this is actually in the lower portion of her leg closer to the ankle and again is continuing to drain as well unfortunately. There does not appear to be any signs of active infection at this time which is good news. No fevers, chills, nausea, vomiting, or diarrhea. She tells me that since she was seen last year she is actually been doing quite well for the most part with regard to her lower extremities. Unfortunately she now is experiencing a little bit more drainage at this time. She is concerned about getting this under control so that it does not get significantly worse. 02/27/2019 on evaluation today patient appears to be doing somewhat better in regard to her bilateral lower extremity wounds. She has been tolerating the dressing changes without complication. Fortunately there is no signs of active infection at this point. No fevers, chills, nausea, vomiting, or diarrhea. She did get her dressing supplies which is excellent news she was  extremely excited to get these. She also got paperwork from [employer] for their financial assistance program where they may be able to help her out in the future if needed with supplies at discounted prices. 03/06/2019 on evaluation today patient appears to be doing a little worse with regard to both areas of weeping on her bilateral lower extremities. This is around the right medial knee and just above the left ankle. With that being said she is unfortunately not doing as well as I would like to see. I feel like she may need to potentially go see someone at the lymphedema clinic as the wraps that she needs or even beyond what we can do here at the wound care center. She really does not have wounds she just has open areas of weeping that are causing some difficulty for her. Subsequently because of this and the moisture I am concerned about the potential for infection I am going to likely give her a prophylactic antibiotic today, Keflex, just to be on the safe side. Nonetheless again there is no obvious signs of active infection at this time. 03/13/2019 on evaluation today patient appears to be doing well with regard to her bilateral lower extremities where she has been weeping compared to even last week's evaluation. I see some areas of new skin growth which is excellent and overall I am very pleased with how things seem to be progressing. No fevers, chills, nausea, vomiting, or diarrhea. [name], [name] ([id number]) 03/20/2019 on evaluation today patient  unfortunately is continuing to have issues with significant edema of the left lower extremity. Her right side seems to be doing much better. Unfortunately her left side is showing increased weeping of the lower portion of her leg. This is quite unfortunate obviously we were hoping to get her into the lymphedema clinic they really do not seem to when I see her how if she is draining. Despite the fact this is really not wound related but more lymphedema  weeping related. Nonetheless I do not know that this can be helpful for her to even go for that appointment since again I am not sure there is much that they would actually do at this point. We may need to try a 4 layer compression wrap as best we can on her leg. She is on the Augmentin currently although I am still concerned about whether or not there could be potentially something going on infection wise I would obtain a culture though I understand is not the best being that is a surface culture I just 1 to make sure I do not seem to be missing anything. 03/27/2019 on evaluation today patient appears to be doing much better in regard to the left lower extremity compared to last week. Last week she had tremendous weeping which I think was subsequent to infection now she seems to be doing much better and very pleased. This is not completely healed but there is a lot of new skin growth and it has dried out quite a bit. Overall I think that we are doing well with how things are moving along at this time. No fevers, chills, nausea, vomiting, or diarrhea. 04/03/2019 evaluation today patient appears to be doing a little worse this week compared to last time I saw her. I think this may be due to the fact that she is having issues with not being able to sleep in her bed at least not until last night. She is therefore been in a lift chair and subsequently has also had issues with not been able to use her pumps since she could not get in bed. With that being said the patient overall seems to be doing okay I do think I may want extend the antibiotic for a little bit longer at least until we can see if her edema and her weeping gets better and if it is then obviously I can always discontinue the antibiotics as of next week however I want her to continue to have it over the next week. 04/10/2019 on evaluation today patient unfortunately is still doing poorly with regard to her left lower extremity. Her right is all  things considering doing fairly well. On the left however she continues to have spreading of the area of infection and weeping which appears to be even a larger surface area than noted last week. She did have a positive culture for Pseudomonas in particular which seems to have been of concern she still has green/yellow discharge consistent with Pseudomonas and subsequently a tremendous amount of it. This has me obviously still concerned about the infection not really clearing up despite the fact that on culture it appears the Cipro should have been a good option for treating this. I think she may at this point need IV antibiotics since things are not doing better I do not want to get worse and cause sepsis. She is in agreement with the plan and believes as well that she likely does need to go to the hospital for IV vancomycin.  Or something of the like depending on what the recommendation is from the ER. 04/17/2019 on evaluation today patient appears to be doing excellent in regard to her lower extremity on the left. She was in the hospital for several days from when I sent her last we saw her until just this past Tuesday. Fortunately her drainage is significantly improved and in fact is mostly clear. There is just a couple small areas that may still drain a little bit she states that the [facility] they prescribed for her at discharge she went picked up from pharmacy and got home but has not been able to find it since. She is looked everywhere. She is wondering if I will replace that for her today I will be more than happy to do that. 05/01/2019 on evaluation today patient actually appears to be doing quite well with regard to her lower extremities. She occasionally is having areas that will leak and then heal up mainly when a piece of the fibrotic skin pops off but fortunately she is not having any signs of active infection at this time. Overall she also really does not have any obvious weeping at this  time. I do believe however she really needs some compression wraps and I think this may be a good time to get her back to the lymphedema clinic. 05/11/2019 on evaluation today patient actually appears to be doing quite well with regard to her bilateral lower extremities. She occasionally will have a small area that we per another but in general seems to be completely healed which is great news. Overall very pleased with how everything seems to be progressing. She does have her appointment with lymphedema clinic on November 18. 05/25/2019 on evaluation today patient appears to be doing well with regard to her left lower extremity. I am very pleased in this regard. In regard to her right leg this actually did start draining more I think it is mainly due to the fact that her leg is more swollen. I am not seeing any obvious signs of infection at this time although that is definitely something were obviously acutely aware of simply due to the fact that she had an issue not too far back with exactly this issue. Nonetheless I do feel like that lymphedema clinic would still be beneficial for her. I explained obviously if they are not able to do anything treatment wise on the right leg we could at least have them treat her left leg and then proceed from there. The patient is really in agreement with that plan. If they are able to do both as the drainage slows down that I would be happy to let them handle both. 06/01/2019 on evaluation today patient unfortunately appears to be doing worse with regard to her right lower extremity. The left lower extremity is still maintaining at this point. Unfortunately she has been having significantly increased pain over the past several days and has been experiencing as well increased swelling of the right lower extremity. I really do not know that I am seeing anything that appears to be obvious for infection at this point to be peripherally honest. With that being said the  patient does seem to be having much more swelling that she is even experienced in the past and coupled with increased pain in her hip as well I am concerned that again she could potentially have a DVT although I am not 100% sure of this. I think it something that may need to be checked  out. We discussed the possibility of sending her for a DVT study through the hospital but unfortunately transportation is an issue if she does have a DVT I do not want her to wait days to be able to get in for that test however if she has this scheduled as an outpatient that is as fast that she will be able to get the test scheduled for transportation purposes. That will also fall on Thanksgiving so subsequently she did actually be looking at either Friday or even next week before we would know anything back from this. That is much too long in my opinion. Subsequent to the amount of discomfort she is experiencing the patient is actually okay with going to the ER for evaluation today. 06/12/2019 on evaluation today patient actually appears to be doing significantly better compared to last time I saw her. Following when I last saw her she was actually in the hospital from that Monday until the following Sunday almost 1 full week. She actually was placed on Keflex in the hospital following the time for her to be discharged and Dr. Steva Ready has recommended 2 times a day dosing of the Keflex for the next year in order to help with more prophylactic/preventative measures with regard to her developing cellulitis. Overall I think this sounds like an excellent plan. The patient unfortunately is good to have trouble being treated at lymphedema clinic due to the fact that she really cannot get up on the bed that they have there. They also state that they cannot manage her as long as she has anything draining at this point. Obviously that is somewhat unfortunate as she does need help with edema control but nonetheless we will have  to do what we can for her outside of it sounds like the lymphedema clinic scenario at this point. 06/19/2019 on evaluation today patient appears to be doing fairly well with regard to her bilateral lower extremities. She is not nearly as swollen and shows no signs of infection at this point. There is no evidence of cellulitis whatsoever. She also has no open wounds or draining at this point which is also good news. No fever chills noted. She seems to be in very good spirits and in fact appears to be doing quite well. READMISSION 11/27/2019 [name], [name] ([id number]) This is a 64 year old woman that we have had in this clinic several times before including 2015, 16 and 19 and then most recently from 03/20/2019 through 06/19/2019 with bilateral lower extremity lymphedema. She has had previous arterial and reflux studies done years ago which were not all that remarkable. 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 She does have a positive family history and she had large legs starting may be in her 52s. She was recently in hospital from 10/20/2019 through 10/28/2019 with right leg cellulitis. She was given Ancef and clindamycin and then Zosyn when a culture showed Pseudomonas. At that time there was purulent drainage. She was followed by infectious disease Dr Steva Ready. The patient is now back at home. She has noted increased swelling in the right and no drainage in her right leg mostly on the posterior medial aspect in the calf area. She has not had pain or fever. She has literally been improved lysing above dressings because her at the area of this is far too large for standard compression. She has been wrapping the areas with sheets to resorptive pads. She is found these helped somewhat. She  does have an appointment with the lymphedema clinic in [place] in late June. Past medical history includes bilateral lymphedema, hypertension,  obstructive sleep apnea with CPAP. Recent hospitalization with apparently Pseudomonas cellulitis of the right lower leg 12/15/2019 upon evaluation today patient appears to be doing a little bit worse in regard to her right lower extremity. Unfortunately she is having more weeping down in the lower portion of her leg. Fortunately there is no signs of active infection at this time. No fever chills noted. The patient states she is not having increased pain except for when she attempted to use the lymphedema pumps unfortunately she states that she did have pain when she did this. Otherwise we been using absorptive dressings of one type or another she is using diapers at home and then subsequently Ace wraps. In regard to the barrier cream we have discussed the possibility of derma cloud which she would like to try I do not have a problem with that. 12/22/2019 upon evaluation today patient actually appears to be doing better in regard to her leg ulcers at this point. Fortunately there does not appear to be any signs of active infection which is great news and I am extremely pleased with where things are progressing at this time. There is no sign of active infection currently. The patient is very pleased to see things doing so well. 12/29/2019 upon evaluation today patient appears to be doing a little bit better in regard to her weeping in general over her lower extremities. She does have some signs of mild erythema little bit more than what I noted last week or rather last visit. Nonetheless I think that my threshold for switching her antibiotics from Keflex to something else is very low at this point considering that she has had such severe infections in the past that seem to come almost out of nowhere. There is a little erythema and warmth noted of the lower portion of her leg compared to the upper which also makes me want to go ahead and address things more rapidly at this point. Likely I would switch out  the Keflex for something like Levaquin ideally. 7/16; patient with severe bilateral lymphedema. She has superficial wounds albeit almost circumferential now on the left lateral lower leg. This may be new from last time. Small area on the right anterior lower leg and then another area on the right medial lower leg and of pannus fold. She has been using various absorptive garments. She states she is using her compression pumps once a day occasionally twice. Culture from her last visit here was negative 01/29/2020 on evaluation today patient appears to be doing excellent at this point in regard to her legs with regard to infection I see no signs of active infection at this point. She still does have unfortunately areas of weeping this is minimal on the right now her left is actually significantly worse although I do not think it is as bad as last week with Dr. Dellia Nims saw her. She has been trying to pump and elevate her legs is much as possible. She has previously been on the Keflex and in the past for prevention that seems to do fairly well and likely can extend that today. 02/04/2020 on evaluation today patient appears to be doing better in regard to her legs bilaterally. Fortunately there is no signs of active infection at this time which is great news and overall she has less weeping on the left compared to the right and  there is several spots where she is pretty much sealed up with no draining regions. Overall very pleased in this regard. 02/19/2020 on evaluation today patient appears to be doing very well in regard to her wounds currently. Fortunately there is no evidence of active infection overall very pleased with where things stand. She is significantly improved in regard to her edema I am extremely pleased in this regard she tells me that the popping no longer hurts and in fact she actually looks forward to it. 03/04/2020 on evaluation today patient appears to be doing excellent in regard to her  lower extremities. Fortunately there is no signs of active infection at this time. No fevers, chills, nausea, vomiting, or diarrhea. 03/25/2020 on evaluation today patient appears to be doing a little bit more poorly in regard to her legs at this point. She tells me that she is still continue to have issues with drainage and this has been a little bit worse she was getting ready to start taking the Keflex again but wanted to see me first. Fortunately there is no signs of active infection at this time. No fevers, chills, nausea, vomiting, or diarrhea. 04/15/2020 upon evaluation today patient appears to be doing somewhat poorly in regard to her right leg. She tells me she has been having more pain she has been taking the Keflex that was previously prescribed unfortunately that just does not seem to help with this. She was hoping that the pain on her right was actually coming from the fact that she was having issues with her wrap having gotten caught in her recliner. With that being said she tells me that she knew something was not right. Currently her right leg is warm to touch along with being erythematous all the way up to around at least mid thigh as far as I can see. The left leg does not appear to be doing that badly though there is increased weeping around the ankle region. 05/06/2020 on evaluation today patient appears to be doing much better than last time I saw her. She did go to the hospital where she was admitted for 2 days and treated with antibiotic therapy. She was discharged with antibiotics as well and has done extremely well. I am extremely pleased with where things stand today. There is no signs of active infection at this time which is great news. 05/27/2020 upon evaluation today patient appears to be doing well with regard to her lower extremities bilaterally. She has just a very tiny area on the right leg which is opening on the left leg she is significantly improved though she still  has several areas that do appear to be open this is minimal compared to what is been in the past. In general I am extremely pleased with where things stand today. The patient does tell me she is not been using her pumps quite as much as she should be. I do believe that is 1 area she can definitely work on. She has had a lot going on including a Covid exposure and apparently also a outbreak of likely shingles. 06/24/2020 upon evaluation today patient appears to be doing well with regard to her legs in general although the left leg unfortunately is showing some signs of erythema she does have a little bit of increased weeping and to be honest I am concerned about infection here. I discussed that with her today and I think that we may need to address this sooner rather than later she has been  taking Keflex she is not really certain that is been making a big improvement however. No fevers, chills, nausea, vomiting, or diarrhea. [name], [name] ([id number]) Electronic Signature(s) Signed: 06/24/2020 10:56:07 AM By: Worthy Keeler PA-C Entered By: Worthy Keeler on 06/24/2020 10:56:07 [name] ([id number]) -------------------------------------------------------------------------------- Physical Exam Details Patient Name: [name]. Date of Service: 06/24/2020 10:00 AM Medical Record Number: [id number] Patient Account Number: [id number] Date of Birth/Sex: 1955/10/11 (64 y.o. F) Treating RN: Cornell Barman Primary Care Provider: Tandy Gaw Other Clinician: Referring Provider: Tandy Gaw Treating Provider/Extender: Skipper Cliche in Treatment: 36 Constitutional Obese and well-hydrated in no acute distress. Respiratory normal breathing without difficulty. Psychiatric this patient is able to make decisions and demonstrates good insight into disease process. Alert and Oriented x 3. pleasant and cooperative. Notes Upon inspection patient's wound bed actually showed signs of  some scattered weeping areas of the bilateral lower extremities left greater than right and at this point I am concerned about the erythema and warmth around the left leg. A wound culture was obtained and we will see what the results of that show. In the meantime I am did go ahead and place her on Levaquin currently. Electronic Signature(s) Signed: 06/24/2020 10:56:28 AM By: Worthy Keeler PA-C Entered By: Worthy Keeler on 06/24/2020 10:56:28 [name] ([id number]) -------------------------------------------------------------------------------- Physician Orders Details Patient Name: [name] Date of Service: 06/24/2020 10:00 AM Medical Record Number: [id number] Patient Account Number: [id number] Date of Birth/Sex: 1955-07-11 (64 y.o. 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 Follow-up Appointments o Return Appointment in 1 month Edema Control o Elevate legs to the level of the heart and pump ankles as often as possible o Compression Pump: Use compression pump on left lower extremity for 60 minutes, twice daily. o Compression Pump: Use compression pump on right lower extremity for 60 minutes,  twice daily. Additional Orders / Instructions o Activity as tolerated o Other: - Do your best to sleep in the bed. Do not spend all your time in the recliner. Laboratory o Bacteria identified in Wound by Culture (MICRO) - lower left leg oooo LOINC Code: 8786-7 oooo Convenience Name: Wound culture routine Patient Medications Allergies: ibuprofen, ACE Inhibitors Notifications Medication Indication Start End Levaquin 06/24/2020 DOSE 1 - oral 750 mg tablet - 1 tablet oral taken 1 time per day for 14 days Electronic Signature(s) Signed: 06/24/2020 4:28:23 PM By: Worthy Keeler PA-C Signed: 06/24/2020 5:10:27 PM By: Gretta Cool, BSN, RN, CWS, Kim RN, BSN Previous Signature: 06/24/2020 10:58:05 AM Version By: Para March ([id number]) Entered By: Gretta Cool, BSN, RN, CWS, Kim on 06/24/2020 11:50:07 [name], [name] ([id number]) -------------------------------------------------------------------------------- Problem List Details Patient Name: MAYERLY, KAMAN C. Date of Service: 06/24/2020 10:00 AM Medical Record Number: [id number] Patient Account Number: [id number] Date of Birth/Sex: 11-13-1955 (64  y.o. 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 Date of Service: 06/24/2020 10:00 AM Medical Record Number: [id number] Patient Account Number: [id number] Date of Birth/Sex: 15-Nov-1955 (64 y.o. F) Treating RN: Cornell Barman Primary Care Provider: Tandy Gaw Other Clinician: Referring Provider: Tandy Gaw Treating Provider/Extender: Skipper Cliche in Treatment: 30 Subjective Chief Complaint Information obtained from Patient Bilateral LE lymphedema History of Present Illness (HPI) The patient is a 64 year old female with history of hypertension and a long-standing history of bilateral lower extremity lymphedema (first presented on 4/2) . She has had open ulcers in the past which have always responded to compression therapy. She had briefly been to a lymphedema clinic in the past which helped her at the time. this time around she stopped treatment of her lymphedema pumps approximately 2 weeks ago because of some pain in the knees and then noticed the right leg getting worse. She was seen by her PCP who put her on clindamycin 4 times a day 2 days ago. The patient has seen [facility] and Dr. Delana Meyer had seen her last year where a vascular study including venous and arterial duplex studies were within normal limits. he had recommended compression stockings and lymphedema pumps and the patient has been using this in about 2 weeks ago. She is known to be diabetic but in the past few time she's gone to her primary care doctor her hemoglobin A1c has been normal. 02/11/2015 - after her last visit she took my advice and went to the ER regarding the progressive cellulitis of her right lower extremity and she was admitted between July 17 and 22nd. She received IV antibiotics and then was sent home on a course of steroid-induced and oral antibiotics. She has improved much since then. 02/17/2015 -- she has  been doing fine and the weeping of her legs has remarkably gone down. She has no fresh issues. READMISSION 01/15/18 This patient was given this clinic before most recently in 2016 seen by Dr. Con Memos. She has massive bilateral lymphedema and over the last 2 months this had weeping edema out of the left leg. She has compression pumps but her compliance with these has been minimal. She has [facility] they've been using TCA/ABDs/kerlix under an Ace wrap.she has had recent problems with cellulitis. She was apparently seen in the ER and 12/23/17 and given clindamycin. She was then followed by her primary doctor and given doxycycline and Keflex. The pain seems to have settled down. In April 2018 the patient had arterial studies done at [facility]. This showed triphasic waveforms throughout the right leg and mostly triphasic waveforms on the left except for monophasic at the posterior tibial artery distally. She was not felt to have evidence of right lower extremity arterial stenosis or significant problems on the left side. 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 She also had  a right lower extremity venous Doppler in January 2018 this was limited by the patient's body habitus and lymphedema. Most of the proximal veins were not visualized The patient presents with an area of denuded skin on the anterior medial part of the left calf. There is weeping edema fluid here. 01/22/18; the patient has somewhat better edema control using her compression pumps twice a day and as a result she has much better epithelialization on the left anterior calf area. Only a small open area remains. 01/29/18; the patient has been compliant with her compression pumps. Both the areas on her calf that healed. The remaining area on the left anterior leg is fully epithelialized Readmission: 02/20/2019 upon evaluation today patient presents for reevaluation due to issues that she is  having with the bilateral lower extremities. She actually has wounds open on both legs. On the right she has an area in the crease of her leg on the right around the knee region which is actually draining quite a bit and actually has some fungal type appearance to it. She has been on nystatin powder that seems to have helped to some degree. In regard to the left lower extremity this is actually in the lower portion of her leg closer to the ankle and again is continuing to drain as well unfortunately. There does not appear to be any signs of active infection at this time which is good news. No fevers, chills, nausea, vomiting, or diarrhea. She tells me that since she was seen last year she is actually been doing quite well for the most part with regard to her lower extremities. Unfortunately she now is experiencing a little bit more drainage at this time. She is concerned about getting this under control so that it does not get significantly worse. 02/27/2019 on evaluation today patient appears to be doing somewhat better in regard to her bilateral lower extremity wounds. She has been tolerating the dressing changes without complication. Fortunately there is no signs of active infection at this point. No fevers, chills, nausea, vomiting, or diarrhea. She did get her dressing supplies which is excellent news she was extremely excited to get these. She also got paperwork from [employer] for their financial assistance program where they may be able to help her out in the future if needed with supplies at discounted prices. 03/06/2019 on evaluation today patient appears to be doing a little worse with regard to both areas of weeping on her bilateral lower extremities. This is around the right medial knee and just above the left ankle. With that being said she is unfortunately not doing as well as I would like to see. I feel like she may need to potentially go see someone at the lymphedema clinic as the wraps that  she needs or even beyond what we can do here at the wound care center. She really does not have wounds she just has open areas of weeping that are causing some difficulty for her. Subsequently because of this and the moisture I am concerned about the potential for infection I am going to likely give her a prophylactic antibiotic today, Keflex, just to be on the safe side. Nonetheless again there is no obvious signs of active infection at this time. [name], [name] ([id number]) 03/13/2019 on evaluation today patient appears to be doing well with regard to her bilateral lower extremities where she has been weeping compared to even last week's evaluation. I see some areas of new skin growth which is excellent and  overall I am very pleased with how things seem to be progressing. No fevers, chills, nausea, vomiting, or diarrhea. 03/20/2019 on evaluation today patient unfortunately is continuing to have issues with significant edema of the left lower extremity. Her right side seems to be doing much better. Unfortunately her left side is showing increased weeping of the lower portion of her leg. This is quite unfortunate obviously we were hoping to get her into the lymphedema clinic they really do not seem to when I see her how if she is draining. Despite the fact this is really not wound related but more lymphedema weeping related. Nonetheless I do not know that this can be helpful for her to even go for that appointment since again I am not sure there is much that they would actually do at this point. We may need to try a 4 layer compression wrap as best we can on her leg. She is on the Augmentin currently although I am still concerned about whether or not there could be potentially something going on infection wise I would obtain a culture though I understand is not the best being that is a surface culture I just 1 to make sure I do not seem to be missing anything. 03/27/2019 on evaluation today patient  appears to be doing much better in regard to the left lower extremity compared to last week. Last week she had tremendous weeping which I think was subsequent to infection now she seems to be doing much better and very pleased. This is not completely healed but there is a lot of new skin growth and it has dried out quite a bit. Overall I think that we are doing well with how things are moving along at this time. No fevers, chills, nausea, vomiting, or diarrhea. 04/03/2019 evaluation today patient appears to be doing a little worse this week compared to last time I saw her. I think this may be due to the fact that she is having issues with not being able to sleep in her bed at least not until last night. She is therefore been in a lift chair and subsequently has also had issues with not been able to use her pumps since she could not get in bed. With that being said the patient overall seems to be doing okay I do think I may want extend the antibiotic for a little bit longer at least until we can see if her edema and her weeping gets better and if it is then obviously I can always discontinue the antibiotics as of next week however I want her to continue to have it over the next week. 04/10/2019 on evaluation today patient unfortunately is still doing poorly with regard to her left lower extremity. Her right is all things considering doing fairly well. On the left however she continues to have spreading of the area of infection and weeping which appears to be even a larger surface area than noted last week. She did have a positive culture for Pseudomonas in particular which seems to have been of concern she still has green/yellow discharge consistent with Pseudomonas and subsequently a tremendous amount of it. This has me obviously still concerned about the infection not really clearing up despite the fact that on culture it appears the Cipro should have been a good option for treating this. I think she  may at this point need IV antibiotics since things are not doing better I do not want to get worse and cause sepsis.  She is in agreement with the plan and believes as well that she likely does need to go to the hospital for IV vancomycin. Or something of the like depending on what the recommendation is from the ER. 04/17/2019 on evaluation today patient appears to be doing excellent in regard to her lower extremity on the left. She was in the hospital for several days from when I sent her last we saw her until just this past Tuesday. Fortunately her drainage is significantly improved and in fact is mostly clear. There is just a couple small areas that may still drain a little bit she states that the [facility] they prescribed for her at discharge she went picked up from pharmacy and got home but has not been able to find it since. She is looked everywhere. She is wondering if I will replace that for her today I will be more than happy to do that. 05/01/2019 on evaluation today patient actually appears to be doing quite well with regard to her lower extremities. She occasionally is having areas that will leak and then heal up mainly when a piece of the fibrotic skin pops off but fortunately she is not having any signs of active infection at this time. Overall she also really does not have any obvious weeping at this time. I do believe however she really needs some compression wraps and I think this may be a good time to get her back to the lymphedema clinic. 05/11/2019 on evaluation today patient actually appears to be doing quite well with regard to her bilateral lower extremities. She occasionally will have a small area that we per another but in general seems to be completely healed which is great news. Overall very pleased with how everything seems to be progressing. She does have her appointment with lymphedema clinic on November 18. 05/25/2019 on evaluation today patient appears to be doing well with  regard to her left lower extremity. I am very pleased in this regard. In regard to her right leg this actually did start draining more I think it is mainly due to the fact that her leg is more swollen. I am not seeing any obvious signs of infection at this time although that is definitely something were obviously acutely aware of simply due to the fact that she had an issue not too far back with exactly this issue. Nonetheless I do feel like that lymphedema clinic would still be beneficial for her. I explained obviously if they are not able to do anything treatment wise on the right leg we could at least have them treat her left leg and then proceed from there. The patient is really in agreement with that plan. If they are able to do both as the drainage slows down that I would be happy to let them handle both. 06/01/2019 on evaluation today patient unfortunately appears to be doing worse with regard to her right lower extremity. The left lower extremity is still maintaining at this point. Unfortunately she has been having significantly increased pain over the past several days and has been experiencing as well increased swelling of the right lower extremity. I really do not know that I am seeing anything that appears to be obvious for infection at this point to be peripherally honest. With that being said the patient does seem to be having much more swelling that she is even experienced in the past and coupled with increased pain in her hip as well I am concerned that again she  could potentially have a DVT although I am not 100% sure of this. I think it something that may need to be checked out. We discussed the possibility of sending her for a DVT study through the hospital but unfortunately transportation is an issue if she does have a DVT I do not want her to wait days to be able to get in for that test however if she has this scheduled as an outpatient that is as fast that she will be able to get  the test scheduled for transportation purposes. That will also fall on Thanksgiving so subsequently she did actually be looking at either Friday or even next week before we would know anything back from this. That is much too long in my opinion. Subsequent to the amount of discomfort she is experiencing the patient is actually okay with going to the ER for evaluation today. 06/12/2019 on evaluation today patient actually appears to be doing significantly better compared to last time I saw her. Following when I last saw her she was actually in the hospital from that Monday until the following Sunday almost 1 full week. She actually was placed on Keflex in the hospital following the time for her to be discharged and Dr. Steva Ready has recommended 2 times a day dosing of the Keflex for the next year in order to help with more prophylactic/preventative measures with regard to her developing cellulitis. Overall I think this sounds like an excellent plan. The patient unfortunately is good to have trouble being treated at lymphedema clinic due to the fact that she really cannot get up on the bed that they have there. They also state that they cannot manage her as long as she has anything draining at this point. Obviously that is somewhat unfortunate as she does need help with edema control but nonetheless we will have to do what we can for her outside of it sounds like the lymphedema clinic scenario at this point. 06/19/2019 on evaluation today patient appears to be doing fairly well with regard to her bilateral lower extremities. She is not nearly as swollen [name], [name] ([id number]) and shows no signs of infection at this point. There is no evidence of cellulitis whatsoever. She also has no open wounds or draining at this point which is also good news. No fever chills noted. She seems to be in very good spirits and in fact appears to be doing quite well. READMISSION 11/27/2019 This is a 64 year old  woman that we have had in this clinic several times before including 2015, 16 and 19 and then most recently from 03/20/2019 through 06/19/2019 with bilateral lower extremity lymphedema. She has had previous arterial and reflux studies done years ago which were not all that remarkable. 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 She does have a positive family history and she had large legs starting may be in her 3s. She was recently in hospital from 10/20/2019 through 10/28/2019 with right leg cellulitis. She was given Ancef and clindamycin and then Zosyn when a culture showed Pseudomonas. At that time there was purulent drainage. She was followed by infectious disease Dr Steva Ready. The patient is now back at home. She has noted increased swelling in the right and no drainage in her right leg mostly on the posterior medial aspect in the calf area. She has not had pain or fever. She has literally been improved lysing above dressings because her at the area of this is  far too large for standard compression. She has been wrapping the areas with sheets to resorptive pads. She is found these helped somewhat. She does have an appointment with the lymphedema clinic in [place] in late June. Past medical history includes bilateral lymphedema, hypertension, obstructive sleep apnea with CPAP. Recent hospitalization with apparently Pseudomonas cellulitis of the right lower leg 12/15/2019 upon evaluation today patient appears to be doing a little bit worse in regard to her right lower extremity. Unfortunately she is having more weeping down in the lower portion of her leg. Fortunately there is no signs of active infection at this time. No fever chills noted. The patient states she is not having increased pain except for when she attempted to use the lymphedema pumps unfortunately she states that she did have pain when she did this. Otherwise we been using  absorptive dressings of one type or another she is using diapers at home and then subsequently Ace wraps. In regard to the barrier cream we have discussed the possibility of derma cloud which she would like to try I do not have a problem with that. 12/22/2019 upon evaluation today patient actually appears to be doing better in regard to her leg ulcers at this point. Fortunately there does not appear to be any signs of active infection which is great news and I am extremely pleased with where things are progressing at this time. There is no sign of active infection currently. The patient is very pleased to see things doing so well. 12/29/2019 upon evaluation today patient appears to be doing a little bit better in regard to her weeping in general over her lower extremities. She does have some signs of mild erythema little bit more than what I noted last week or rather last visit. Nonetheless I think that my threshold for switching her antibiotics from Keflex to something else is very low at this point considering that she has had such severe infections in the past that seem to come almost out of nowhere. There is a little erythema and warmth noted of the lower portion of her leg compared to the upper which also makes me want to go ahead and address things more rapidly at this point. Likely I would switch out the Keflex for something like Levaquin ideally. 7/16; patient with severe bilateral lymphedema. She has superficial wounds albeit almost circumferential now on the left lateral lower leg. This may be new from last time. Small area on the right anterior lower leg and then another area on the right medial lower leg and of pannus fold. She has been using various absorptive garments. She states she is using her compression pumps once a day occasionally twice. Culture from her last visit here was negative 01/29/2020 on evaluation today patient appears to be doing excellent at this point in regard to her  legs with regard to infection I see no signs of active infection at this point. She still does have unfortunately areas of weeping this is minimal on the right now her left is actually significantly worse although I do not think it is as bad as last week with Dr. Dellia Nims saw her. She has been trying to pump and elevate her legs is much as possible. She has previously been on the Keflex and in the past for prevention that seems to do fairly well and likely can extend that today. 02/04/2020 on evaluation today patient appears to be doing better in regard to her legs bilaterally. Fortunately there is no signs  of active infection at this time which is great news and overall she has less weeping on the left compared to the right and there is several spots where she is pretty much sealed up with no draining regions. Overall very pleased in this regard. 02/19/2020 on evaluation today patient appears to be doing very well in regard to her wounds currently. Fortunately there is no evidence of active infection overall very pleased with where things stand. She is significantly improved in regard to her edema I am extremely pleased in this regard she tells me that the popping no longer hurts and in fact she actually looks forward to it. 03/04/2020 on evaluation today patient appears to be doing excellent in regard to her lower extremities. Fortunately there is no signs of active infection at this time. No fevers, chills, nausea, vomiting, or diarrhea. 03/25/2020 on evaluation today patient appears to be doing a little bit more poorly in regard to her legs at this point. She tells me that she is still continue to have issues with drainage and this has been a little bit worse she was getting ready to start taking the Keflex again but wanted to see me first. Fortunately there is no signs of active infection at this time. No fevers, chills, nausea, vomiting, or diarrhea. 04/15/2020 upon evaluation today patient appears to  be doing somewhat poorly in regard to her right leg. She tells me she has been having more pain she has been taking the Keflex that was previously prescribed unfortunately that just does not seem to help with this. She was hoping that the pain on her right was actually coming from the fact that she was having issues with her wrap having gotten caught in her recliner. With that being said she tells me that she knew something was not right. Currently her right leg is warm to touch along with being erythematous all the way up to around at least mid thigh as far as I can see. The left leg does not appear to be doing that badly though there is increased weeping around the ankle region. 05/06/2020 on evaluation today patient appears to be doing much better than last time I saw her. She did go to the hospital where she was admitted for 2 days and treated with antibiotic therapy. She was discharged with antibiotics as well and has done extremely well. I am extremely pleased with where things stand today. There is no signs of active infection at this time which is great news. 05/27/2020 upon evaluation today patient appears to be doing well with regard to her lower extremities bilaterally. She has just a very tiny area on the right leg which is opening on the left leg she is significantly improved though she still has several areas that do appear to be open this is minimal compared to what is been in the past. In general I am extremely pleased with where things stand today. The patient does tell me she is not been using her pumps quite as much as she should be. I do believe that is 1 area she can definitely work on. She has had a lot going on including a Covid exposure and apparently also a outbreak of likely shingles. [name], [name] ([id number]) 06/24/2020 upon evaluation today patient appears to be doing well with regard to her legs in general although the left leg unfortunately is showing some signs of  erythema she does have a little bit of increased weeping and to be honest I  am concerned about infection here. I discussed that with her today and I think that we may need to address this sooner rather than later she has been taking Keflex she is not really certain that is been making a big improvement however. No fevers, chills, nausea, vomiting, or diarrhea. Objective Constitutional Obese and well-hydrated in no acute distress. Vitals Time Taken: 10:20 AM, Height: 63 in, Weight: 370 lbs, BMI: 65.5, Temperature: 98.2 F, Pulse: 87 bpm, Respiratory Rate: 20 breaths/min, Blood Pressure: 164/99 mmHg. Respiratory normal breathing without difficulty. Psychiatric this patient is able to make decisions and demonstrates good insight into disease process. Alert and Oriented x 3. pleasant and cooperative. General Notes: Upon inspection patient's wound bed actually showed signs of some scattered weeping areas of the bilateral lower extremities left greater than right and at this point I am concerned about the erythema and warmth around the left leg. A wound culture was obtained and we will see what the results of that show. In the meantime I am did go ahead and place her on Levaquin currently. 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 Follow-up Appointments: Return Appointment in 1 month Edema Control: Elevate legs to the level of the heart and pump ankles as often as  possible Compression Pump: Use compression pump on left lower extremity for 60 minutes, twice daily. Compression Pump: Use compression pump on right lower extremity for 60 minutes, twice daily. Additional Orders / Instructions: Activity as tolerated Other: - Do your best to sleep in the bed. Do not spend all your time in the recliner. The following medication(s) was prescribed: Levaquin oral 750 mg tablet 1 1 tablet oral taken 1 time per day for 14 days starting 06/24/2020 [name], [name]. ([id number]) 1. Would recommend currently that we go ahead and initiate treatment with Levaquin for the patient this will be 750 mg am hopeful that we can get rid of the infection before it gets to a much more significant state. She is in agreement with that plan. With that being said were also going to see the results of the culture and make any adjustments as necessary once we get that result back. 2. I am also can recommend she try to elevate her legs much as possible including sleeping in the bed more and using her lymphedema pumps if she can twice a day is ideal but at least once a day. She is trying to do so. 3. I did recommend she monitor for any signs of worsening infection. If she develops any fevers, chills, nausea, vomiting, or diarrhea she should let me know as soon as possible. We will see patient back for reevaluation in 1 month here in the clinic. If anything worsens or changes patient will contact our office for additional recommendations. Electronic Signature(s) Signed: 06/24/2020 10:58:57 AM By: Worthy Keeler PA-C Entered By: Worthy Keeler on 06/24/2020 10:58:57 Barbeau, [name] ([id number]) -------------------------------------------------------------------------------- SuperBill Details Patient Name: [name] Date of Service: 06/24/2020 Medical Record Number: [id number] Patient Account Number: [id number] Date of Birth/Sex: February 29, 1956 (64 y.o. F)  Treating RN: Cornell Barman Primary Care Provider: Tandy Gaw Other Clinician: Referring Provider: Tandy Gaw Treating Provider/Extender: Skipper Cliche in Treatment: 30 Diagnosis Coding ICD-10 Codes Code Description Q82.0 Hereditary lymphedema L97.811 Non-pressure chronic ulcer of other part of right lower leg limited to breakdown of skin L97.821 Non-pressure chronic ulcer of other part of left lower leg limited to breakdown of skin Facility Procedures CPT4 Code: 65784696 Description: [place] VISIT-LEV 3 EST PT Modifier: Quantity: 1 Physician Procedures CPT4 Code: 2952841 Description: 32440 - WC PHYS LEVEL 4 - EST PT Modifier: Quantity: 1 CPT4 Code: Description: ICD-10 Diagnosis Description Q82.0 Hereditary lymphedema L97.821 Non-pressure chronic ulcer of other part of left lower leg limited to brea L97.811 Non-pressure chronic ulcer of other part of right lower leg limited to bre Modifier: kdown of skin akdown of skin Quantity: Electronic Signature(s) Signed: 06/24/2020 11:51:39 AM By: Gretta Cool, BSN, RN, CWS, Kim RN, BSN Signed: 06/24/2020 4:28:23 PM By: Worthy Keeler PA-C Previous Signature: 06/24/2020 10:59:09 AM Version By: Worthy Keeler PA-C Entered By: Gretta Cool, BSN, RN, CWS, Kim on 06/24/2020 11:51:39

## 2020-06-24 NOTE — Progress Notes (Signed)
[name], [name] ([id number]) Visit Report for 06/24/2020 Arrival Information Details Patient Name: [name], [name]. Date of Service: 06/24/2020 10:00 AM Medical Record Number: [id number] Patient Account Number: [id number] Date of Birth/Sex: Aug 09, 1955 (64 y.o. F) Treating RN: [name] Primary Care [name]: [name] Other Clinician: Referring [name]: [name] Treating [name]/Extender: [name] in Treatment: 17 Visit Information History Since Last Visit Added or deleted any medications: Yes Patient Arrived: Cane Any new allergies or adverse reactions: No Arrival Time: 10:13 Had a fall or experienced change in No Accompanied By: self activities of daily living that may affect Transfer Assistance: None risk of falls: Patient Identification Verified: Yes Signs or symptoms of abuse/neglect since last visito No Secondary Verification Process Completed: Yes Hospitalized since last visit: No Patient Requires Transmission-Based Precautions: No Implantable device outside of the clinic excluding No Patient Has Alerts: No cellular tissue based products placed in the center since last visit: Has Dressing in Place as Prescribed: Yes Has Compression in Place as Prescribed: Yes Pain Present Now: Yes Electronic Signature(s) Signed: 06/24/2020 4:30:13 PM By: [name] RCP, RRT, CHT Entered By: [name] on 06/24/2020 10:17:59 [name] ([id number]) -------------------------------------------------------------------------------- Clinic Level of Care Assessment Details Patient Name: [name]. Date of Service: 06/24/2020 10:00 AM Medical Record Number: [id number] Patient Account Number: [id number] Date of Birth/Sex: 1956/04/16 (64 y.o. F) Treating RN: [name] Primary Care Kaedynce Tapp: [name] Other Clinician: Referring [name]: [name] Treating [name]/Extender: [name] in Treatment:  30 Clinic Level of Care Assessment Items TOOL 4 Quantity Score []  - Use when only an EandM is performed on FOLLOW-UP visit 0 ASSESSMENTS - Nursing Assessment / Reassessment X - Reassessment of Co-morbidities (includes updates in patient status) 1 10 X- 1 5 Reassessment of Adherence to Treatment Plan ASSESSMENTS - Wound and Skin Assessment / Reassessment []  - Simple Wound Assessment / Reassessment - one wound 0 X- 1 5 Complex Wound Assessment / Reassessment - multiple wounds []  - 0 Dermatologic / Skin Assessment (not related to wound area) ASSESSMENTS - Focused Assessment []  - Circumferential Edema Measurements - multi extremities 0 []  - 0 Nutritional Assessment / Counseling / Intervention []  - 0 Lower Extremity Assessment (monofilament, tuning fork, pulses) []  - 0 Peripheral Arterial Disease Assessment (using hand held doppler) ASSESSMENTS - Ostomy and/or Continence Assessment and Care []  - Incontinence Assessment and Management 0 []  - 0 Ostomy Care Assessment and Management (repouching, etc.) PROCESS - Coordination of Care X - Simple Patient / Family Education for ongoing care 1 15 []  - 0 Complex (extensive) Patient / Family Education for ongoing care []  - 0 Staff obtains Programmer, systems, Records, Test Results / Process Orders []  - 0 Staff telephones HHA, Nursing Homes / Clarify orders / etc []  - 0 Routine Transfer to another Facility (non-emergent condition) []  - 0 Routine Hospital Admission (non-emergent condition) []  - 0 New Admissions / Biomedical engineer / Ordering NPWT, Apligraf, etc. []  - 0 Emergency Hospital Admission (emergent condition) X- 1 10 Simple Discharge Coordination []  - 0 Complex (extensive) Discharge Coordination PROCESS - Special Needs []  - Pediatric / Minor Patient Management 0 []  - 0 Isolation Patient Management []  - 0 Hearing / Language / Visual special needs []  - 0 Assessment of Community assistance (transportation, D/C planning, etc.) []   - 0 Additional assistance / Altered mentation []  - 0 Support Surface(s) Assessment (bed, cushion, seat, etc.) INTERVENTIONS - Wound Cleansing / Measurement [name], [name] ([id number]) []  - 0 Simple Wound Cleansing -  one wound X- 2 5 Complex Wound Cleansing - multiple wounds X- 1 5 Wound Imaging (photographs - any number of wounds) []  - 0 Wound Tracing (instead of photographs) []  - 0 Simple Wound Measurement - one wound X- 2 5 Complex Wound Measurement - multiple wounds INTERVENTIONS - Wound Dressings []  - Small Wound Dressing one or multiple wounds 0 X- 2 15 Medium Wound Dressing one or multiple wounds []  - 0 Large Wound Dressing one or multiple wounds []  - 0 Application of Medications - topical []  - 0 Application of Medications - injection INTERVENTIONS - Miscellaneous []  - External ear exam 0 []  - 0 Specimen Collection (cultures, biopsies, blood, body fluids, etc.) []  - 0 Specimen(s) / Culture(s) sent or taken to Lab for analysis X- 1 10 Patient Transfer (multiple staff / Civil Service fast streamer / Similar devices) []  - 0 Simple Staple / Suture removal (25 or less) []  - 0 Complex Staple / Suture removal (26 or more) []  - 0 Hypo / Hyperglycemic Management (close monitor of Blood Glucose) []  - 0 Ankle / Brachial Index (ABI) - do not check if billed separately X- 1 5 Vital Signs Has the patient been seen at the hospital within the last three years: Yes Total Score: 115 Level Of Care: New/Established - Level 3 Electronic Signature(s) Signed: 06/24/2020 5:10:27 PM By: Gretta [name], BSN, RN, CWS, Kim RN, BSN Entered By: Gretta [name], BSN, RN, CWS, [name] on 06/24/2020 11:51:28 [name] ([id number]) -------------------------------------------------------------------------------- Encounter Discharge Information Details Patient Name: Elizabeth Landau C. Date of Service: 06/24/2020 10:00 AM Medical Record Number: [id number] Patient Account Number: [id number] Date of Birth/Sex: January 28, 1956  (64 y.o. F) Treating RN: [name] Primary Care [name]: [name] Other Clinician: Referring [name]: [name] Treating [name]/Extender: [name] in Treatment: 30 Encounter Discharge Information Items Discharge Condition: Stable Ambulatory Status: Cane Discharge Destination: Home Transportation: Private Auto Accompanied By: self Schedule Follow-up Appointment: Yes Clinical Summary of Care: Electronic Signature(s) Signed: 06/24/2020 11:53:16 AM By: Gretta [name], BSN, RN, CWS, Kim RN, BSN Entered By: Gretta [name], BSN, RN, CWS, [name] on 06/24/2020 11:53:16 [name] ([id number]) -------------------------------------------------------------------------------- General Visit Notes Details Patient Name: [name] Date of Service: 06/24/2020 10:00 AM Medical Record Number: [id number] Patient Account Number: [id number] Date of Birth/Sex: Oct 04, 1955 (64 y.o. F) Treating RN: [name] Primary Care Makara Lanzo: [name] Other Clinician: Referring Madelaine Whipple: [name] Treating Bree Heinzelman/Extender: [name] in Treatment: 30 Notes PA notified of BP 164/99. Patient states she has not taken her medication this morning. Electronic Signature(s) Signed: 06/24/2020 5:10:27 PM By: Gretta [name], BSN, RN, CWS, Kim RN, BSN Entered By: Gretta [name], BSN, RN, CWS, [name] on 06/24/2020 11:01:30 [name] ([id number]) -------------------------------------------------------------------------------- Lower Extremity Assessment Details Patient Name: [name], [name]. Date of Service: 06/24/2020 10:00 AM Medical Record Number: [id number] Patient Account Number: [id number] Date of Birth/Sex: March 11, 1956 (64 y.o. F) Treating RN: Dolan Amen Primary Care Neomi Laidler: [name] Other Clinician: Referring Aranda Bihm: [name] Treating Kodiak Rollyson/Extender: [name] in Treatment: 30 Edema Assessment Assessed: [Left: Yes] [Right: Yes] Edema: [Left: Yes] [Right:  Yes] Calf Left: Right: Point of Measurement: 32 cm From Medial Instep 90.2 cm 88.8 cm Ankle Left: Right: Point of Measurement: 9 cm From Medial Instep 47.5 cm 48.5 cm Electronic Signature(s) Signed: 06/24/2020 11:53:56 AM By: Georges Mouse, Minus Breeding RN Entered By: Georges Mouse, Minus Breeding on 06/24/2020 10:29:33 [name] ([id number]) -------------------------------------------------------------------------------- Multi Wound Chart Details Patient Name: [name]. Date of Service: 06/24/2020 10:00 AM Medical Record Number: [id number] Patient Account  Number: [id number] Date of Birth/Sex: 03-29-1956 (64 y.o. F) Treating RN: [name] Primary Care Melady Chow: [name] Other Clinician: Referring Patty Leitzke: [name] Treating Zebadiah Willert/Extender: [name] in Treatment: 30 Vital Signs Height(in): 63 Pulse(bpm): 87 Weight(lbs): 370 Blood Pressure(mmHg): 164/99 Body Mass Index(BMI): 66 Temperature(F): 98.2 Respiratory Rate(breaths/min): 20 Wound Assessments Treatment Notes Electronic Signature(s) Signed: 06/24/2020 5:10:27 PM By: Gretta [name], BSN, RN, CWS, Kim RN, BSN Entered By: Gretta [name], BSN, RN, CWS, [name] on 06/24/2020 10:45:24 [name] ([id number]) -------------------------------------------------------------------------------- [place] Details Patient Name: [name], [name]. Date of Service: 06/24/2020 10:00 AM Medical Record Number: [id number] Patient Account Number: [id number] Date of Birth/Sex: 11/26/1955 (64 y.o. F) Treating RN: [name] Primary Care Dorinda Stehr: [name] Other Clinician: Referring Peggi Yono: [name] Treating Maanasa Aderhold/Extender: [name] in Treatment: 61 Active Inactive Abuse / Safety / Falls / Self Care Management Nursing Diagnoses: Potential for falls Goals: Patient will remain injury free related to falls Date Initiated: 11/27/2019 Target Resolution Date: 02/13/2020 Goal Status:  Active Interventions: Assess fall risk on admission and as needed Notes: Orientation to the Wound Care Program Nursing Diagnoses: Knowledge deficit related to the wound healing center program Goals: Patient/caregiver will verbalize understanding of the [facility] Date Initiated: 11/27/2019 Target Resolution Date: 02/13/2020 Goal Status: Active Interventions: Provide education on orientation to the wound center Notes: Venous Leg Ulcer Nursing Diagnoses: Potential for venous Insuffiency (use before diagnosis confirmed) Goals: Patient will maintain optimal edema control Date Initiated: 11/27/2019 Target Resolution Date: 02/13/2020 Goal Status: Active Interventions: Compression as ordered Notes: Electronic Signature(s) Signed: 06/24/2020 5:10:27 PM By: Gretta [name], BSN, RN, CWS, Kim RN, BSN Entered By: Gretta [name], BSN, RN, CWS, [name] on 06/24/2020 10:41:17 [name] ([id number]) -------------------------------------------------------------------------------- Pain Assessment Details Patient Name: [name]. Date of Service: 06/24/2020 10:00 AM Medical Record Number: [id number] Patient Account Number: [id number] Date of Birth/Sex: [birth year] (64 y.o. F) Treating RN: Dolan Amen Primary Care Sencere Symonette: [name] Other Clinician: Referring Eileene Kisling: [name] Treating Itzayana Pardy/Extender: [name] in Treatment: 30 Active Problems Location of Pain Severity and Description of Pain Patient Has Paino Yes Site Locations Pain Location: Generalized Pain Rate the pain. Current Pain Level: 7 Pain Management and Medication Current Pain Management: Electronic Signature(s) Signed: 06/24/2020 11:53:56 AM By: Georges Mouse, Minus Breeding RN Entered By: Georges Mouse, Minus Breeding on 06/24/2020 10:23:11 [name] ([id number]) -------------------------------------------------------------------------------- Patient/Caregiver Education Details Patient Name:  [name] Date of Service: 06/24/2020 10:00 AM Medical Record Number: [id number] Patient Account Number: [id number] Date of Birth/Gender: Oct 01, 1955 (64 y.o. F) Treating RN: [name] Primary Care Physician: [name] Other Clinician: Referring Physician: Tandy [name] Treating Physician/Extender: [name] in Treatment: 60 Education Assessment Education Provided To: Patient Education Topics Provided Wound/Skin Impairment: Handouts: Caring for Your Ulcer, Other: continue wound care as precribed. Methods: Demonstration, Explain/Verbal Responses: State content correctly Electronic Signature(s) Signed: 06/24/2020 5:10:27 PM By: Gretta [name], BSN, RN, CWS, Kim RN, BSN Entered By: Gretta [name], BSN, RN, CWS, [name] on 06/24/2020 11:52:16 [name] ([id number]) -------------------------------------------------------------------------------- Vitals Details Patient Name: [name] Date of Service: 06/24/2020 10:00 AM Medical Record Number: [id number] Patient Account Number: [id number] Date of Birth/Sex: Jan 05, 1956 (64 y.o. F) Treating RN: [name] Primary Care Vallorie Niccoli: [name] Other Clinician: Referring Bernadetta Roell: [name] Treating Tristen Pennino/Extender: [name] in Treatment: 30 Vital Signs Time Taken: 10:20 Temperature (F): 98.2 Height (in): 63 Pulse (bpm): 87 Weight (lbs): 370 Respiratory Rate (breaths/min): 20 Body Mass Index (BMI): 65.5 Blood Pressure (mmHg): 164/99 Reference Range:  80 - 120 mg / dl Electronic Signature(s) Signed: 06/24/2020 4:30:13 PM By: [name] RCP, RRT, CHT Entered By: Becky Sax, Amado Nash on 06/24/2020 10:18:27

## 2020-06-27 LAB — AEROBIC CULTURE W GRAM STAIN (SUPERFICIAL SPECIMEN)

## 2020-06-30 ENCOUNTER — Other Ambulatory Visit: Payer: Self-pay

## 2020-06-30 ENCOUNTER — Encounter: Payer: Medicaid Other | Admitting: Physician Assistant

## 2020-06-30 DIAGNOSIS — E11622 Type 2 diabetes mellitus with other skin ulcer: Secondary | ICD-10-CM | POA: Diagnosis not present

## 2020-06-30 NOTE — Progress Notes (Signed)
WEALTHA, DOMINGO ([id number]) Visit Report for 06/30/2020 Arrival Information Details Patient Name: [name], [name]. Date of Service: 06/30/2020 1:45 PM Medical Record Number: [id number] Patient Account Number: [id number] Date of Birth/Sex: 06-03-1956 (64 y.o. F) Treating RN: Army Melia Primary Care Aleem Elza: Tandy Gaw Other Clinician: Referring Evani Shrider: Tandy Gaw Treating Krishang Reading/Extender: Skipper Cliche in Treatment: 36 Visit Information History Since Last Visit Added or deleted any medications: No Patient Arrived: [name] Any new allergies or adverse reactions: No Arrival Time: 13:59 Had a fall or experienced change in No Accompanied By: self activities of daily living that may affect Transfer Assistance: None risk of falls: Patient Identification Verified: Yes Signs or symptoms of abuse/neglect since last visito No Patient Requires Transmission-Based Precautions: No Hospitalized since last visit: No Patient Has Alerts: No Has Dressing in Place as Prescribed: Yes Pain Present Now: No Electronic Signature(s) Signed: 06/30/2020 4:19:50 PM By: Army Melia Entered By: Army Melia on 06/30/2020 13:59:34 [name], [name] ([id number]) -------------------------------------------------------------------------------- Clinic Level of Care Assessment Details Patient Name: [name]. Date of Service: 06/30/2020 1:45 PM Medical Record Number: [id number] Patient Account Number: [id number] Date of Birth/Sex: Sep 23, 1955 (64 y.o. F) Treating RN: Cornell Barman Primary Care Brinton Brandel: Tandy Gaw Other Clinician: Referring Burgundy Matuszak: Tandy Gaw Treating Sye Schroepfer/Extender: Skipper Cliche in Treatment: 30 Clinic Level of Care Assessment Items TOOL 4 Quantity Score []  - Use when only an EandM is performed on FOLLOW-UP visit 0 ASSESSMENTS - Nursing Assessment / Reassessment X - Reassessment of Co-morbidities (includes updates in patient status) 1 10 X- 1  5 Reassessment of Adherence to Treatment Plan ASSESSMENTS - Wound and Skin Assessment / Reassessment []  - Simple Wound Assessment / Reassessment - one wound 0 []  - 0 Complex Wound Assessment / Reassessment - multiple wounds X- 1 10 Dermatologic / Skin Assessment (not related to wound area) ASSESSMENTS - Focused Assessment X - Circumferential Edema Measurements - multi extremities 1 5 []  - 0 Nutritional Assessment / Counseling / Intervention X- 1 5 Lower Extremity Assessment (monofilament, tuning fork, pulses) []  - 0 Peripheral Arterial Disease Assessment (using hand held doppler) ASSESSMENTS - Ostomy and/or Continence Assessment and Care []  - Incontinence Assessment and Management 0 []  - 0 Ostomy Care Assessment and Management (repouching, etc.) PROCESS - Coordination of Care X - Simple Patient / Family Education for ongoing care 1 15 []  - 0 Complex (extensive) Patient / Family Education for ongoing care []  - 0 Staff obtains Programmer, systems, Records, Test Results / Process Orders []  - 0 Staff telephones HHA, Nursing Homes / Clarify orders / etc []  - 0 Routine Transfer to another Facility (non-emergent condition) []  - 0 Routine Hospital Admission (non-emergent condition) []  - 0 New Admissions / Biomedical engineer / Ordering NPWT, Apligraf, etc. []  - 0 Emergency Hospital Admission (emergent condition) X- 1 10 Simple Discharge Coordination []  - 0 Complex (extensive) Discharge Coordination PROCESS - Special Needs []  - Pediatric / Minor Patient Management 0 []  - 0 Isolation Patient Management []  - 0 Hearing / Language / Visual special needs []  - 0 Assessment of Community assistance (transportation, D/C planning, etc.) []  - 0 Additional assistance / Altered mentation []  - 0 Support Surface(s) Assessment (bed, cushion, seat, etc.) INTERVENTIONS - Wound Cleansing / Measurement Flath, Kyaira C. ([id number]) X- 1 5 Simple Wound Cleansing - one wound []  - 0 Complex Wound  Cleansing - multiple wounds X- 1 5 Wound Imaging (photographs - any number of wounds) []  - 0 Wound Tracing (instead of photographs) X- 1 5 Simple  Wound Measurement - one wound []  - 0 Complex Wound Measurement - multiple wounds INTERVENTIONS - Wound Dressings []  - Small Wound Dressing one or multiple wounds 0 []  - 0 Medium Wound Dressing one or multiple wounds X- 1 20 Large Wound Dressing one or multiple wounds []  - 0 Application of Medications - topical []  - 0 Application of Medications - injection INTERVENTIONS - Miscellaneous []  - External ear exam 0 []  - 0 Specimen Collection (cultures, biopsies, blood, body fluids, etc.) []  - 0 Specimen(s) / Culture(s) sent or taken to Lab for analysis []  - 0 Patient Transfer (multiple staff / Civil Service fast streamer / Similar devices) []  - 0 Simple Staple / Suture removal (25 or less) []  - 0 Complex Staple / Suture removal (26 or more) []  - 0 Hypo / Hyperglycemic Management (close monitor of Blood Glucose) []  - 0 Ankle / Brachial Index (ABI) - do not check if billed separately X- 1 5 Vital Signs Has the patient been seen at the hospital within the last three years: Yes Total Score: 100 Level Of Care: New/Established - Level 3 Electronic Signature(s) Unsigned Entered By: Gretta Cool, BSN, RN, CWS, Kim on 06/30/2020 14:15:58 Signature(s): Date(s): [name] ([id number]) -------------------------------------------------------------------------------- Encounter Discharge Information Details Patient Name: [name], [name]. Date of Service: 06/30/2020 1:45 PM Medical Record Number: [id number] Patient Account Number: [id number] Date of Birth/Sex: [birth year] (64 y.o. F) Treating RN: Cornell Barman Primary Care Brodyn Depuy: Tandy Gaw Other Clinician: Referring Analea Muller: Tandy Gaw Treating Righteous Claiborne/Extender: Skipper Cliche in Treatment: 30 Encounter Discharge Information Items Discharge Condition: Stable Ambulatory Status:  Ambulatory Discharge Destination: Home Transportation: Private Auto Accompanied By: self Schedule Follow-up Appointment: Yes Clinical Summary of Care: Electronic Signature(s) Unsigned Entered By: Gretta Cool, BSN, RN, CWS, Kim on 06/30/2020 14:18:29 Signature(s): Date(s): [name] ([id number]) -------------------------------------------------------------------------------- Lower Extremity Assessment Details Patient Name: [name], [name]. Date of Service: 06/30/2020 1:45 PM Medical Record Number: [id number] Patient Account Number: [id number] Date of Birth/Sex: [birth year] (64 y.o. F) Treating RN: Army Melia Primary Care Quinntin Malter: Tandy Gaw Other Clinician: Referring Harlen Danford: Tandy Gaw Treating Leilanni Halvorson/Extender: Skipper Cliche in Treatment: 30 Edema Assessment Assessed: [Left: No] [Right: No] Edema: [Left: Yes] [Right: Yes] Calf Left: Right: Point of Measurement: 32 cm From Medial Instep 82 cm 92 cm Ankle Left: Right: Point of Measurement: 9 cm From Medial Instep 47 cm 52 cm Vascular Assessment Pulses: Dorsalis Pedis Palpable: [Left:Yes] [Right:Yes] Electronic Signature(s) Signed: 06/30/2020 4:19:50 PM By: Army Melia Entered By: Army Melia on 06/30/2020 14:10:01 Pioche, [name] ([id number]) -------------------------------------------------------------------------------- Multi Wound Chart Details Patient Name: [name]. Date of Service: 06/30/2020 1:45 PM Medical Record Number: [id number] Patient Account Number: [id number] Date of Birth/Sex: 1956/06/09 (64 y.o. F) Treating RN: Cornell Barman Primary Care Rami Waddle: Tandy Gaw Other Clinician: Referring Seve Monette: Tandy Gaw Treating Maryfer Tauzin/Extender: Skipper Cliche in Treatment: 30 Vital Signs Height(in): 63 Pulse(bpm): 64 Weight(lbs): 370 Blood Pressure(mmHg): 151/91 Body Mass Index(BMI): 66 Temperature(F): 98.1 Respiratory Rate(breaths/min): 16 Wound Assessments Treatment  Notes Electronic Signature(s) Unsigned Entered By: Gretta Cool, BSN, RN, CWS, Kim on 06/30/2020 14:13:57 Signature(s): Date(s): [name] ([id number]) -------------------------------------------------------------------------------- Multi-Disciplinary Care Plan Details Patient Name: [name], VERDI. Date of Service: 06/30/2020 1:45 PM Medical Record Number: [id number] Patient Account Number: [id number] Date of Birth/Sex: [birth year] (64 y.o. F) Treating RN: Cornell Barman Primary Care Taleyah Hillman: Tandy Gaw Other Clinician: Referring Ponciano Shealy: Tandy Gaw Treating Jericca Russett/Extender: Skipper Cliche in Treatment: 30 Active Inactive Abuse / Safety / Falls / Self Care Management Nursing Diagnoses: Potential for falls  Goals: Patient will remain injury free related to falls Date Initiated: 11/27/2019 Target Resolution Date: 02/13/2020 Goal Status: Active Interventions: Assess fall risk on admission and as needed Notes: Orientation to the Wound Care Program Nursing Diagnoses: Knowledge deficit related to the wound healing center program Goals: Patient/caregiver will verbalize understanding of the [place] Date Initiated: 11/27/2019 Target Resolution Date: 02/13/2020 Goal Status: Active Interventions: Provide education on orientation to the wound center Notes: Venous Leg Ulcer Nursing Diagnoses: Potential for venous Insuffiency (use before diagnosis confirmed) Goals: Patient will maintain optimal edema control Date Initiated: 11/27/2019 Target Resolution Date: 02/13/2020 Goal Status: Active Interventions: Compression as ordered Notes: Electronic Signature(s) Unsigned Entered By: Gretta Cool, BSN, RN, CWS, Kim on 06/30/2020 14:13:44 Signature(s): Date(s): [name] ([id number]) -------------------------------------------------------------------------------- Non-Wound Condition Assessment Details Patient Name: [name], VASCONCELLOS. Date of Service:  06/30/2020 1:45 PM Medical Record Number: [id number] Patient Account Number: [id number] Date of Birth/Sex: Apr 05, 1956 (64 y.o. F) Treating RN: Army Melia Primary Care Chelan Heringer: Tandy Gaw Other Clinician: Referring Marjorie Deprey: Tandy Gaw Treating Gracelin Weisberg/Extender: Skipper Cliche in Treatment: 30 Non-Wound Condition: Condition: Lymphedema Location: Leg Side: Left Notes: decreased drainage this visit. Photos Electronic Signature(s) Signed: 06/30/2020 4:19:50 PM By: Army Melia Entered By: Army Melia on 06/30/2020 14:07:28 [name], [name] ([id number]) -------------------------------------------------------------------------------- Non-Wound Condition Assessment Details Patient Name: Elizabeth Landau C. Date of Service: 06/30/2020 1:45 PM Medical Record Number: [id number] Patient Account Number: [id number] Date of Birth/Sex: July 20, 1955 (64 y.o. F) Treating RN: Army Melia Primary Care Eberardo Demello: Tandy Gaw Other Clinician: Referring Evart Mcdonnell: Tandy Gaw Treating Paulena Servais/Extender: Skipper Cliche in Treatment: 30 Non-Wound Condition: Condition: Lymphedema Location: Leg Side: Right Notes: Right leg with decreased drainage this visit. Photos Electronic Signature(s) Signed: 06/30/2020 4:19:50 PM By: Army Melia Entered By: Army Melia on 06/30/2020 14:07:55 [name], [name] ([id number]) -------------------------------------------------------------------------------- Pain Assessment Details Patient Name: [name]. Date of Service: 06/30/2020 1:45 PM Medical Record Number: [id number] Patient Account Number: [id number] Date of Birth/Sex: 1956-02-07 (64 y.o. F) Treating RN: Army Melia Primary Care Philopater Mucha: Tandy Gaw Other Clinician: Referring Franz Svec: Tandy Gaw Treating Ashaunte Standley/Extender: Skipper Cliche in Treatment: 30 Active Problems Location of Pain Severity and Description of Pain Patient Has Paino No Site Locations Pain Management  and Medication Current Pain Management: Electronic Signature(s) Signed: 06/30/2020 4:19:50 PM By: Army Melia Entered By: Army Melia on 06/30/2020 14:01:45 [name], [name] ([id number]) -------------------------------------------------------------------------------- Patient/Caregiver Education Details Patient Name: [name]. Date of Service: 06/30/2020 1:45 PM Medical Record Number: [id number] Patient Account Number: [id number] Date of Birth/Gender: 31-Aug-1955 (64 y.o. F) Treating RN: Cornell Barman Primary Care Physician: Tandy Gaw Other Clinician: Referring Physician: Tandy Gaw Treating Physician/Extender: Skipper Cliche in Treatment: 30 Education Assessment Education Provided To: Patient Education Topics Provided Venous: Wound/Skin Impairment: Handouts: Caring for Your Ulcer, Other: continue wound care as prescribed Methods: Demonstration, Explain/Verbal Responses: State content correctly Electronic Signature(s) Unsigned Entered By: Gretta Cool, BSN, RN, CWS, Kim on 06/30/2020 14:17:17 Signature(s): Date(s): [name] ([id number]) -------------------------------------------------------------------------------- Vitals Details Patient Name: [name] Date of Service: 06/30/2020 1:45 PM Medical Record Number: [id number] Patient Account Number: [id number] Date of Birth/Sex: 06-Mar-1956 (64 y.o. F) Treating RN: Army Melia Primary Care Sheanna Dail: Tandy Gaw Other Clinician: Referring Aamirah Salmi: Tandy Gaw Treating Deontez Klinke/Extender: Skipper Cliche in Treatment: 30 Vital Signs Time Taken: 13:59 Temperature (F): 98.1 Height (in): 63 Pulse (bpm): 64 Weight (lbs): 370 Respiratory Rate (breaths/min): 16 Body Mass Index (BMI): 65.5 Blood Pressure (mmHg): 151/91 Reference Range: 80 - 120 mg / dl  Electronic Signature(s) Signed: 06/30/2020 4:19:50 PM By: Army Melia Entered By: Army Melia on 06/30/2020 14:01:28

## 2020-06-30 NOTE — Progress Notes (Signed)
VADIE, PRINCIPATO ([id number]) Visit Report for 06/30/2020 Chief Complaint Document Details Patient Name: [name], [name]. Date of Service: 06/30/2020 1:45 PM Medical Record Number: [id number] Patient Account Number: [id number] Date of Birth/Sex: [birth year] (64 y.o. F) Treating RN: Huel Coventry Primary Care Provider: Dala Dock Other Clinician: Referring Provider: Dala Dock Treating Provider/Extender: Rowan Blase in Treatment: 30 Information Obtained from: Patient Chief Complaint Bilateral LE lymphedema Electronic Signature(s) Signed: 06/30/2020 1:58:14 PM By: Lenda Kelp PA-C Entered By: Lenda Kelp on 06/30/2020 13:58:13 Mckee, Ronda Fairly ([id number]) -------------------------------------------------------------------------------- HPI Details Patient Name: [name] Date of Service: 06/30/2020 1:45 PM Medical Record Number: [id number] Patient Account Number: [id number] Date of Birth/Sex: 11-14-1955 (64 y.o. F) Treating RN: Huel Coventry Primary Care Provider: Dala Dock Other Clinician: Referring Provider: Dala Dock Treating Provider/Extender: Rowan Blase in Treatment: 30 History of Present Illness HPI Description: The patient is a 64 year old female with history of hypertension and a long-standing history of bilateral lower extremity lymphedema (first presented on 4/2) . She has had open ulcers in the past which have always responded to compression therapy. She had briefly been to a lymphedema clinic in the past which helped her at the time. this time around she stopped treatment of her lymphedema pumps approximately 2 weeks ago because of some pain in the knees and then noticed the right leg getting worse. She was seen by her PCP who put her on clindamycin 4 times a day 2 days ago. The patient has seen [facility] and Dr. Gilda Crease had seen her last year where a vascular study including venous and arterial duplex studies were within normal limits. he  had recommended compression stockings and lymphedema pumps and the patient has been using this in about 2 weeks ago. She is known to be diabetic but in the past few time she's gone to her primary care doctor her hemoglobin A1c has been normal. 02/11/2015 - after her last visit she took my advice and went to the ER regarding the progressive cellulitis of her right lower extremity and she was admitted between July 17 and 22nd. She received IV antibiotics and then was sent home on a course of steroid-induced and oral antibiotics. She has improved much since then. 02/17/2015 -- she has been doing fine and the weeping of her legs has remarkably gone down. She has no fresh issues. READMISSION 01/15/18 This patient was given this clinic before most recently in 2016 seen by Dr. Meyer Russel. She has massive bilateral lymphedema and over the last 2 months this had weeping edema out of the left leg. She has compression pumps but her compliance with these has been minimal. She has [facility] they've been using TCA/ABDs/kerlix under an Ace wrap.she has had recent problems with cellulitis. She was apparently seen in the ER and 12/23/17 and given clindamycin. She was then followed by her primary doctor and given doxycycline and Keflex. The pain seems to have settled down. In April 2018 the patient had arterial studies done at [facility]. This showed triphasic waveforms throughout the right leg and mostly triphasic waveforms on the left except for monophasic at the posterior tibial artery distally. She was not felt to have evidence of right lower extremity arterial stenosis or significant problems on the left side. 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 She also had a right lower extremity venous Doppler in January 2018 this was limited by the patient's body habitus and lymphedema. Most of the proximal veins  were not visualized The patient presents with an area of  denuded skin on the anterior medial part of the left calf. There is weeping edema fluid here. 01/22/18; the patient has somewhat better edema control using her compression pumps twice a day and as a result she has much better epithelialization on the left anterior calf area. Only a small open area remains. 01/29/18; the patient has been compliant with her compression pumps. Both the areas on her calf that healed. The remaining area on the left anterior leg is fully epithelialized Readmission: 02/20/2019 upon evaluation today patient presents for reevaluation due to issues that she is having with the bilateral lower extremities. She actually has wounds open on both legs. On the right she has an area in the crease of her leg on the right around the knee region which is actually draining quite a bit and actually has some fungal type appearance to it. She has been on nystatin powder that seems to have helped to some degree. In regard to the left lower extremity this is actually in the lower portion of her leg closer to the ankle and again is continuing to drain as well unfortunately. There does not appear to be any signs of active infection at this time which is good news. No fevers, chills, nausea, vomiting, or diarrhea. She tells me that since she was seen last year she is actually been doing quite well for the most part with regard to her lower extremities. Unfortunately she now is experiencing a little bit more drainage at this time. She is concerned about getting this under control so that it does not get significantly worse. 02/27/2019 on evaluation today patient appears to be doing somewhat better in regard to her bilateral lower extremity wounds. She has been tolerating the dressing changes without complication. Fortunately there is no signs of active infection at this point. No fevers, chills, nausea, vomiting, or diarrhea. She did get her dressing supplies which is excellent news she was extremely  excited to get these. She also got paperwork from [employer] for their financial assistance program where they may be able to help her out in the future if needed with supplies at discounted prices. 03/06/2019 on evaluation today patient appears to be doing a little worse with regard to both areas of weeping on her bilateral lower extremities. This is around the right medial knee and just above the left ankle. With that being said she is unfortunately not doing as well as I would like to see. I feel like she may need to potentially go see someone at the lymphedema clinic as the wraps that she needs or even beyond what we can do here at the wound care center. She really does not have wounds she just has open areas of weeping that are causing some difficulty for her. Subsequently because of this and the moisture I am concerned about the potential for infection I am going to likely give her a prophylactic antibiotic today, Keflex, just to be on the safe side. Nonetheless again there is no obvious signs of active infection at this time. 03/13/2019 on evaluation today patient appears to be doing well with regard to her bilateral lower extremities where she has been weeping compared to even last week's evaluation. I see some areas of new skin growth which is excellent and overall I am very pleased with how things seem to be progressing. No fevers, chills, nausea, vomiting, or diarrhea. LLOYD, CULLINAN ([id number]) 03/20/2019 on evaluation today patient  unfortunately is continuing to have issues with significant edema of the left lower extremity. Her right side seems to be doing much better. Unfortunately her left side is showing increased weeping of the lower portion of her leg. This is quite unfortunate obviously we were hoping to get her into the lymphedema clinic they really do not seem to when I see her how if she is draining. Despite the fact this is really not wound related but more lymphedema weeping  related. Nonetheless I do not know that this can be helpful for her to even go for that appointment since again I am not sure there is much that they would actually do at this point. We may need to try a 4 layer compression wrap as best we can on her leg. She is on the Augmentin currently although I am still concerned about whether or not there could be potentially something going on infection wise I would obtain a culture though I understand is not the best being that is a surface culture I just 1 to make sure I do not seem to be missing anything. 03/27/2019 on evaluation today patient appears to be doing much better in regard to the left lower extremity compared to last week. Last week she had tremendous weeping which I think was subsequent to infection now she seems to be doing much better and very pleased. This is not completely healed but there is a lot of new skin growth and it has dried out quite a bit. Overall I think that we are doing well with how things are moving along at this time. No fevers, chills, nausea, vomiting, or diarrhea. 04/03/2019 evaluation today patient appears to be doing a little worse this week compared to last time I saw her. I think this may be due to the fact that she is having issues with not being able to sleep in her bed at least not until last night. She is therefore been in a lift chair and subsequently has also had issues with not been able to use her pumps since she could not get in bed. With that being said the patient overall seems to be doing okay I do think I may want extend the antibiotic for a little bit longer at least until we can see if her edema and her weeping gets better and if it is then obviously I can always discontinue the antibiotics as of next week however I want her to continue to have it over the next week. 04/10/2019 on evaluation today patient unfortunately is still doing poorly with regard to her left lower extremity. Her right is all things  considering doing fairly well. On the left however she continues to have spreading of the area of infection and weeping which appears to be even a larger surface area than noted last week. She did have a positive culture for Pseudomonas in particular which seems to have been of concern she still has green/yellow discharge consistent with Pseudomonas and subsequently a tremendous amount of it. This has me obviously still concerned about the infection not really clearing up despite the fact that on culture it appears the Cipro should have been a good option for treating this. I think she may at this point need IV antibiotics since things are not doing better I do not want to get worse and cause sepsis. She is in agreement with the plan and believes as well that she likely does need to go to the hospital for IV vancomycin.  Or something of the like depending on what the recommendation is from the ER. 04/17/2019 on evaluation today patient appears to be doing excellent in regard to her lower extremity on the left. She was in the hospital for several days from when I sent her last we saw her until just this past Tuesday. Fortunately her drainage is significantly improved and in fact is mostly clear. There is just a couple small areas that may still drain a little bit she states that the [facility] they prescribed for her at discharge she went picked up from pharmacy and got home but has not been able to find it since. She is looked everywhere. She is wondering if I will replace that for her today I will be more than happy to do that. 05/01/2019 on evaluation today patient actually appears to be doing quite well with regard to her lower extremities. She occasionally is having areas that will leak and then heal up mainly when a piece of the fibrotic skin pops off but fortunately she is not having any signs of active infection at this time. Overall she also really does not have any obvious weeping at this time. I  do believe however she really needs some compression wraps and I think this may be a good time to get her back to the lymphedema clinic. 05/11/2019 on evaluation today patient actually appears to be doing quite well with regard to her bilateral lower extremities. She occasionally will have a small area that we per another but in general seems to be completely healed which is great news. Overall very pleased with how everything seems to be progressing. She does have her appointment with lymphedema clinic on November 18. 05/25/2019 on evaluation today patient appears to be doing well with regard to her left lower extremity. I am very pleased in this regard. In regard to her right leg this actually did start draining more I think it is mainly due to the fact that her leg is more swollen. I am not seeing any obvious signs of infection at this time although that is definitely something were obviously acutely aware of simply due to the fact that she had an issue not too far back with exactly this issue. Nonetheless I do feel like that lymphedema clinic would still be beneficial for her. I explained obviously if they are not able to do anything treatment wise on the right leg we could at least have them treat her left leg and then proceed from there. The patient is really in agreement with that plan. If they are able to do both as the drainage slows down that I would be happy to let them handle both. 06/01/2019 on evaluation today patient unfortunately appears to be doing worse with regard to her right lower extremity. The left lower extremity is still maintaining at this point. Unfortunately she has been having significantly increased pain over the past several days and has been experiencing as well increased swelling of the right lower extremity. I really do not know that I am seeing anything that appears to be obvious for infection at this point to be peripherally honest. With that being said the patient  does seem to be having much more swelling that she is even experienced in the past and coupled with increased pain in her hip as well I am concerned that again she could potentially have a DVT although I am not 100% sure of this. I think it something that may need to be checked  out. We discussed the possibility of sending her for a DVT study through the hospital but unfortunately transportation is an issue if she does have a DVT I do not want her to wait days to be able to get in for that test however if she has this scheduled as an outpatient that is as fast that she will be able to get the test scheduled for transportation purposes. That will also fall on Thanksgiving so subsequently she did actually be looking at either Friday or even next week before we would know anything back from this. That is much too long in my opinion. Subsequent to the amount of discomfort she is experiencing the patient is actually okay with going to the ER for evaluation today. 06/12/2019 on evaluation today patient actually appears to be doing significantly better compared to last time I saw her. Following when I last saw her she was actually in the hospital from that Monday until the following Sunday almost 1 full week. She actually was placed on Keflex in the hospital following the time for her to be discharged and Dr. Joylene Draft has recommended 2 times a day dosing of the Keflex for the next year in order to help with more prophylactic/preventative measures with regard to her developing cellulitis. Overall I think this sounds like an excellent plan. The patient unfortunately is good to have trouble being treated at lymphedema clinic due to the fact that she really cannot get up on the bed that they have there. They also state that they cannot manage her as long as she has anything draining at this point. Obviously that is somewhat unfortunate as she does need help with edema control but nonetheless we will have to do  what we can for her outside of it sounds like the lymphedema clinic scenario at this point. 06/19/2019 on evaluation today patient appears to be doing fairly well with regard to her bilateral lower extremities. She is not nearly as swollen and shows no signs of infection at this point. There is no evidence of cellulitis whatsoever. She also has no open wounds or draining at this point which is also good news. No fever chills noted. She seems to be in very good spirits and in fact appears to be doing quite well. READMISSION 11/27/2019 DEAZIA, LAMPI ([id number]) This is a 64 year old woman that we have had in this clinic several times before including 2015, 16 and 19 and then most recently from 03/20/2019 through 06/19/2019 with bilateral lower extremity lymphedema. She has had previous arterial and reflux studies done years ago which were not all that remarkable. 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 She does have a positive family history and she had large legs starting may be in her 47s. She was recently in hospital from 10/20/2019 through 10/28/2019 with right leg cellulitis. She was given Ancef and clindamycin and then Zosyn when a culture showed Pseudomonas. At that time there was purulent drainage. She was followed by infectious disease Dr Joylene Draft. The patient is now back at home. She has noted increased swelling in the right and no drainage in her right leg mostly on the posterior medial aspect in the calf area. She has not had pain or fever. She has literally been improved lysing above dressings because her at the area of this is far too large for standard compression. She has been wrapping the areas with sheets to resorptive pads. She is found these helped somewhat. She  does have an appointment with the lymphedema clinic in [place] in late June. Past medical history includes bilateral lymphedema, hypertension, obstructive  sleep apnea with CPAP. Recent hospitalization with apparently Pseudomonas cellulitis of the right lower leg 12/15/2019 upon evaluation today patient appears to be doing a little bit worse in regard to her right lower extremity. Unfortunately she is having more weeping down in the lower portion of her leg. Fortunately there is no signs of active infection at this time. No fever chills noted. The patient states she is not having increased pain except for when she attempted to use the lymphedema pumps unfortunately she states that she did have pain when she did this. Otherwise we been using absorptive dressings of one type or another she is using diapers at home and then subsequently Ace wraps. In regard to the barrier cream we have discussed the possibility of derma cloud which she would like to try I do not have a problem with that. 12/22/2019 upon evaluation today patient actually appears to be doing better in regard to her leg ulcers at this point. Fortunately there does not appear to be any signs of active infection which is great news and I am extremely pleased with where things are progressing at this time. There is no sign of active infection currently. The patient is very pleased to see things doing so well. 12/29/2019 upon evaluation today patient appears to be doing a little bit better in regard to her weeping in general over her lower extremities. She does have some signs of mild erythema little bit more than what I noted last week or rather last visit. Nonetheless I think that my threshold for switching her antibiotics from Keflex to something else is very low at this point considering that she has had such severe infections in the past that seem to come almost out of nowhere. There is a little erythema and warmth noted of the lower portion of her leg compared to the upper which also makes me want to go ahead and address things more rapidly at this point. Likely I would switch out the Keflex for  something like Levaquin ideally. 7/16; patient with severe bilateral lymphedema. She has superficial wounds albeit almost circumferential now on the left lateral lower leg. This may be new from last time. Small area on the right anterior lower leg and then another area on the right medial lower leg and of pannus fold. She has been using various absorptive garments. She states she is using her compression pumps once a day occasionally twice. Culture from her last visit here was negative 01/29/2020 on evaluation today patient appears to be doing excellent at this point in regard to her legs with regard to infection I see no signs of active infection at this point. She still does have unfortunately areas of weeping this is minimal on the right now her left is actually significantly worse although I do not think it is as bad as last week with Dr. Dellia Nims saw her. She has been trying to pump and elevate her legs is much as possible. She has previously been on the Keflex and in the past for prevention that seems to do fairly well and likely can extend that today. 02/04/2020 on evaluation today patient appears to be doing better in regard to her legs bilaterally. Fortunately there is no signs of active infection at this time which is great news and overall she has less weeping on the left compared to the right and  there is several spots where she is pretty much sealed up with no draining regions. Overall very pleased in this regard. 02/19/2020 on evaluation today patient appears to be doing very well in regard to her wounds currently. Fortunately there is no evidence of active infection overall very pleased with where things stand. She is significantly improved in regard to her edema I am extremely pleased in this regard she tells me that the popping no longer hurts and in fact she actually looks forward to it. 03/04/2020 on evaluation today patient appears to be doing excellent in regard to her lower  extremities. Fortunately there is no signs of active infection at this time. No fevers, chills, nausea, vomiting, or diarrhea. 03/25/2020 on evaluation today patient appears to be doing a little bit more poorly in regard to her legs at this point. She tells me that she is still continue to have issues with drainage and this has been a little bit worse she was getting ready to start taking the Keflex again but wanted to see me first. Fortunately there is no signs of active infection at this time. No fevers, chills, nausea, vomiting, or diarrhea. 04/15/2020 upon evaluation today patient appears to be doing somewhat poorly in regard to her right leg. She tells me she has been having more pain she has been taking the Keflex that was previously prescribed unfortunately that just does not seem to help with this. She was hoping that the pain on her right was actually coming from the fact that she was having issues with her wrap having gotten caught in her recliner. With that being said she tells me that she knew something was not right. Currently her right leg is warm to touch along with being erythematous all the way up to around at least mid thigh as far as I can see. The left leg does not appear to be doing that badly though there is increased weeping around the ankle region. 05/06/2020 on evaluation today patient appears to be doing much better than last time I saw her. She did go to the hospital where she was admitted for 2 days and treated with antibiotic therapy. She was discharged with antibiotics as well and has done extremely well. I am extremely pleased with where things stand today. There is no signs of active infection at this time which is great news. 05/27/2020 upon evaluation today patient appears to be doing well with regard to her lower extremities bilaterally. She has just a very tiny area on the right leg which is opening on the left leg she is significantly improved though she still has  several areas that do appear to be open this is minimal compared to what is been in the past. In general I am extremely pleased with where things stand today. The patient does tell me she is not been using her pumps quite as much as she should be. I do believe that is 1 area she can definitely work on. She has had a lot going on including a Covid exposure and apparently also a outbreak of likely shingles. 06/24/2020 upon evaluation today patient appears to be doing well with regard to her legs in general although the left leg unfortunately is showing some signs of erythema she does have a little bit of increased weeping and to be honest I am concerned about infection here. I discussed that with her today and I think that we may need to address this sooner rather than later she has been  taking Keflex she is not really certain that is been making a big improvement however. No fevers, chills, nausea, vomiting, or diarrhea. CLELIA, TRABUCCO ([id number]) 06/30/2020 upon evaluation today patient's legs actually seem to be doing better in my opinion as compared to where they were last week. Fortunately there does not appear to be any signs of active infection. Her culture showed multiple organisms nothing predominate. With that being said the Levaquin seems to have done well I think she has improved since I last saw her as well. Electronic Signature(s) Signed: 06/30/2020 2:37:34 PM By: Lenda Kelp PA-C Entered By: Lenda Kelp on 06/30/2020 14:37:34 Asquith, Ronda Fairly ([id number]) -------------------------------------------------------------------------------- Physical Exam Details Patient Name: JAEL, WALDORF C. Date of Service: 06/30/2020 1:45 PM Medical Record Number: [id number] Patient Account Number: [id number] Date of Birth/Sex: Feb 01, 1956 (64 y.o. F) Treating RN: Huel Coventry Primary Care Provider: Dala Dock Other Clinician: Referring Provider: Dala Dock Treating Provider/Extender:  Rowan Blase in Treatment: 30 Constitutional Obese and well-hydrated in no acute distress. Respiratory normal breathing without difficulty. Psychiatric this patient is able to make decisions and demonstrates good insight into disease process. Alert and Oriented x 3. pleasant and cooperative. Notes She has wounds again showed signs of being less extensive on the left lower extremity and right lower extremity but especially the left and I am very pleased to see this. There does not appear to be any signs of active infection at this time which is great news overall I think she should complete the Levaquin although I do believe she is doing quite well all things considered. Electronic Signature(s) Signed: 06/30/2020 2:37:52 PM By: Lenda Kelp PA-C Entered By: Lenda Kelp on 06/30/2020 14:37:52 Sterling, Ronda Fairly ([id number]) -------------------------------------------------------------------------------- Physician Orders Details Patient Name: [name] Date of Service: 06/30/2020 1:45 PM Medical Record Number: [id number] Patient Account Number: [id number] Date of Birth/Sex: [birth year] (64 y.o. 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 Follow-up Appointments o Return Appointment in 1 month Edema Control o Elevate legs to the level of the heart and pump ankles as often as possible o Compression Pump: Use compression pump on left lower extremity for 60 minutes, twice daily. o Compression Pump: Use compression pump on right lower extremity for 60 minutes, twice daily. Additional Orders / Instructions o Activity as tolerated o Other: - Do your best to sleep in the bed. Do not spend all your time in the recliner. Electronic Signature(s) Signed: 06/30/2020 4:46:48 PM By: Lenda Kelp PA-C Entered By: Elliot Gurney, BSN, RN, CWS, Kim on 06/30/2020 14:14:54 Gloeckner, Ronda Fairly ([id number]) -------------------------------------------------------------------------------- Problem List Details Patient Name: RILEY, PAPIN. Date of Service: 06/30/2020 1:45 PM Medical Record Number: [id number] Patient Account Number: [id number] Date of Birth/Sex: 02-Dec-1955 (64 y.o. 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 Date of Service: 06/30/2020 1:45 PM Medical Record Number:  [id number] Patient Account Number: [id number] Date of Birth/Sex: 08-04-1955 (64 y.o. F) Treating RN: Huel Coventry Primary Care Provider: Dala Dock Other Clinician: Referring Provider: Dala Dock Treating Provider/Extender: Rowan Blase in Treatment: 30 Subjective Chief Complaint Information obtained from Patient Bilateral LE lymphedema History of Present Illness (HPI) The patient is a 63 year old female with history of hypertension and a long-standing history of bilateral lower extremity lymphedema (first presented on 4/2) . She has had open ulcers in the past which have always responded to compression therapy. She had briefly been to a lymphedema clinic in the past which helped her at the time. this time around she stopped treatment of her lymphedema pumps approximately 2 weeks ago because of some pain in the knees and then noticed the right leg getting worse. She was seen by her PCP who put her on clindamycin 4 times a day 2 days ago. The patient has seen [facility] and Dr. Gilda Crease had seen her last year where a vascular study including venous and arterial duplex studies were within normal limits. he had recommended compression stockings and lymphedema pumps and the patient has been using this in about 2 weeks ago. She is known to be diabetic but in the past few time she's gone to her primary care doctor her hemoglobin A1c has been normal. 02/11/2015 - after her last visit she took my advice and went to the ER regarding the progressive cellulitis of her right lower extremity and she was admitted between July 17 and 22nd. She received IV antibiotics and then was sent home on a course of steroid-induced and oral antibiotics. She has improved much since then. 02/17/2015 -- she has been doing fine and the weeping of her legs has remarkably gone down. She has no fresh issues. READMISSION 01/15/18 This patient was given this clinic before most recently in 2016 seen by Dr. Meyer Russel. She has  massive bilateral lymphedema and over the last 2 months this had weeping edema out of the left leg. She has compression pumps but her compliance with these has been minimal. She has [facility] they've been using TCA/ABDs/kerlix under an Ace wrap.she has had recent problems with cellulitis. She was apparently seen in the ER and 12/23/17 and given clindamycin. She was then followed by her primary doctor and given doxycycline and Keflex. The pain seems to have settled down. In April 2018 the patient had arterial studies done at [facility]. This showed triphasic waveforms throughout the right leg and mostly triphasic waveforms on the left except for monophasic at the posterior tibial artery distally. She was not felt to have evidence of right lower extremity arterial stenosis or significant problems on the left side. 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 She also had a right lower extremity venous Doppler in January 2018 this was limited by the patient's  body habitus and lymphedema. Most of the proximal veins were not visualized The patient presents with an area of denuded skin on the anterior medial part of the left calf. There is weeping edema fluid here. 01/22/18; the patient has somewhat better edema control using her compression pumps twice a day and as a result she has much better epithelialization on the left anterior calf area. Only a small open area remains. 01/29/18; the patient has been compliant with her compression pumps. Both the areas on her calf that healed. The remaining area on the left anterior leg is fully epithelialized Readmission: 02/20/2019 upon evaluation today patient presents for reevaluation due to issues that she is having with the bilateral lower extremities. She actually has wounds open on both legs. On the right she has an area in the crease of her leg on the right around the knee region which is actually draining quite a  bit and actually has some fungal type appearance to it. She has been on nystatin powder that seems to have helped to some degree. In regard to the left lower extremity this is actually in the lower portion of her leg closer to the ankle and again is continuing to drain as well unfortunately. There does not appear to be any signs of active infection at this time which is good news. No fevers, chills, nausea, vomiting, or diarrhea. She tells me that since she was seen last year she is actually been doing quite well for the most part with regard to her lower extremities. Unfortunately she now is experiencing a little bit more drainage at this time. She is concerned about getting this under control so that it does not get significantly worse. 02/27/2019 on evaluation today patient appears to be doing somewhat better in regard to her bilateral lower extremity wounds. She has been tolerating the dressing changes without complication. Fortunately there is no signs of active infection at this point. No fevers, chills, nausea, vomiting, or diarrhea. She did get her dressing supplies which is excellent news she was extremely excited to get these. She also got paperwork from [employer] for their financial assistance program where they may be able to help her out in the future if needed with supplies at discounted prices. 03/06/2019 on evaluation today patient appears to be doing a little worse with regard to both areas of weeping on her bilateral lower extremities. This is around the right medial knee and just above the left ankle. With that being said she is unfortunately not doing as well as I would like to see. I feel like she may need to potentially go see someone at the lymphedema clinic as the wraps that she needs or even beyond what we can do here at the wound care center. She really does not have wounds she just has open areas of weeping that are causing some difficulty for her. Subsequently because of this and  the moisture I am concerned about the potential for infection I am going to likely give her a prophylactic antibiotic today, Keflex, just to be on the safe side. Nonetheless again there is no obvious signs of active infection at this time. HIBO, BLASDELL ([id number]) 03/13/2019 on evaluation today patient appears to be doing well with regard to her bilateral lower extremities where she has been weeping compared to even last week's evaluation. I see some areas of new skin growth which is excellent and overall I am very pleased with how things seem to be progressing. No fevers, chills,  nausea, vomiting, or diarrhea. 03/20/2019 on evaluation today patient unfortunately is continuing to have issues with significant edema of the left lower extremity. Her right side seems to be doing much better. Unfortunately her left side is showing increased weeping of the lower portion of her leg. This is quite unfortunate obviously we were hoping to get her into the lymphedema clinic they really do not seem to when I see her how if she is draining. Despite the fact this is really not wound related but more lymphedema weeping related. Nonetheless I do not know that this can be helpful for her to even go for that appointment since again I am not sure there is much that they would actually do at this point. We may need to try a 4 layer compression wrap as best we can on her leg. She is on the Augmentin currently although I am still concerned about whether or not there could be potentially something going on infection wise I would obtain a culture though I understand is not the best being that is a surface culture I just 1 to make sure I do not seem to be missing anything. 03/27/2019 on evaluation today patient appears to be doing much better in regard to the left lower extremity compared to last week. Last week she had tremendous weeping which I think was subsequent to infection now she seems to be doing much better and  very pleased. This is not completely healed but there is a lot of new skin growth and it has dried out quite a bit. Overall I think that we are doing well with how things are moving along at this time. No fevers, chills, nausea, vomiting, or diarrhea. 04/03/2019 evaluation today patient appears to be doing a little worse this week compared to last time I saw her. I think this may be due to the fact that she is having issues with not being able to sleep in her bed at least not until last night. She is therefore been in a lift chair and subsequently has also had issues with not been able to use her pumps since she could not get in bed. With that being said the patient overall seems to be doing okay I do think I may want extend the antibiotic for a little bit longer at least until we can see if her edema and her weeping gets better and if it is then obviously I can always discontinue the antibiotics as of next week however I want her to continue to have it over the next week. 04/10/2019 on evaluation today patient unfortunately is still doing poorly with regard to her left lower extremity. Her right is all things considering doing fairly well. On the left however she continues to have spreading of the area of infection and weeping which appears to be even a larger surface area than noted last week. She did have a positive culture for Pseudomonas in particular which seems to have been of concern she still has green/yellow discharge consistent with Pseudomonas and subsequently a tremendous amount of it. This has me obviously still concerned about the infection not really clearing up despite the fact that on culture it appears the Cipro should have been a good option for treating this. I think she may at this point need IV antibiotics since things are not doing better I do not want to get worse and cause sepsis. She is in agreement with the plan and believes as well that she likely does need  to go to the  hospital for IV vancomycin. Or something of the like depending on what the recommendation is from the ER. 04/17/2019 on evaluation today patient appears to be doing excellent in regard to her lower extremity on the left. She was in the hospital for several days from when I sent her last we saw her until just this past Tuesday. Fortunately her drainage is significantly improved and in fact is mostly clear. There is just a couple small areas that may still drain a little bit she states that the [facility] they prescribed for her at discharge she went picked up from pharmacy and got home but has not been able to find it since. She is looked everywhere. She is wondering if I will replace that for her today I will be more than happy to do that. 05/01/2019 on evaluation today patient actually appears to be doing quite well with regard to her lower extremities. She occasionally is having areas that will leak and then heal up mainly when a piece of the fibrotic skin pops off but fortunately she is not having any signs of active infection at this time. Overall she also really does not have any obvious weeping at this time. I do believe however she really needs some compression wraps and I think this may be a good time to get her back to the lymphedema clinic. 05/11/2019 on evaluation today patient actually appears to be doing quite well with regard to her bilateral lower extremities. She occasionally will have a small area that we per another but in general seems to be completely healed which is great news. Overall very pleased with how everything seems to be progressing. She does have her appointment with lymphedema clinic on November 18. 05/25/2019 on evaluation today patient appears to be doing well with regard to her left lower extremity. I am very pleased in this regard. In regard to her right leg this actually did start draining more I think it is mainly due to the fact that her leg is more swollen. I am not  seeing any obvious signs of infection at this time although that is definitely something were obviously acutely aware of simply due to the fact that she had an issue not too far back with exactly this issue. Nonetheless I do feel like that lymphedema clinic would still be beneficial for her. I explained obviously if they are not able to do anything treatment wise on the right leg we could at least have them treat her left leg and then proceed from there. The patient is really in agreement with that plan. If they are able to do both as the drainage slows down that I would be happy to let them handle both. 06/01/2019 on evaluation today patient unfortunately appears to be doing worse with regard to her right lower extremity. The left lower extremity is still maintaining at this point. Unfortunately she has been having significantly increased pain over the past several days and has been experiencing as well increased swelling of the right lower extremity. I really do not know that I am seeing anything that appears to be obvious for infection at this point to be peripherally honest. With that being said the patient does seem to be having much more swelling that she is even experienced in the past and coupled with increased pain in her hip as well I am concerned that again she could potentially have a DVT although I am not 100% sure of this. I think  it something that may need to be checked out. We discussed the possibility of sending her for a DVT study through the hospital but unfortunately transportation is an issue if she does have a DVT I do not want her to wait days to be able to get in for that test however if she has this scheduled as an outpatient that is as fast that she will be able to get the test scheduled for transportation purposes. That will also fall on Thanksgiving so subsequently she did actually be looking at either Friday or even next week before we would know anything back from this.  That is much too long in my opinion. Subsequent to the amount of discomfort she is experiencing the patient is actually okay with going to the ER for evaluation today. 06/12/2019 on evaluation today patient actually appears to be doing significantly better compared to last time I saw her. Following when I last saw her she was actually in the hospital from that Monday until the following Sunday almost 1 full week. She actually was placed on Keflex in the hospital following the time for her to be discharged and Dr. Joylene Draft has recommended 2 times a day dosing of the Keflex for the next year in order to help with more prophylactic/preventative measures with regard to her developing cellulitis. Overall I think this sounds like an excellent plan. The patient unfortunately is good to have trouble being treated at lymphedema clinic due to the fact that she really cannot get up on the bed that they have there. They also state that they cannot manage her as long as she has anything draining at this point. Obviously that is somewhat unfortunate as she does need help with edema control but nonetheless we will have to do what we can for her outside of it sounds like the lymphedema clinic scenario at this point. 06/19/2019 on evaluation today patient appears to be doing fairly well with regard to her bilateral lower extremities. She is not nearly as swollen Marsch, Reyonna C. ([id number]) and shows no signs of infection at this point. There is no evidence of cellulitis whatsoever. She also has no open wounds or draining at this point which is also good news. No fever chills noted. She seems to be in very good spirits and in fact appears to be doing quite well. READMISSION 11/27/2019 This is a 64 year old woman that we have had in this clinic several times before including 2015, 16 and 19 and then most recently from 03/20/2019 through 06/19/2019 with bilateral lower extremity lymphedema. She has had previous  arterial and reflux studies done years ago which were not all that remarkable. 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 She does have a positive family history and she had large legs starting may be in her 39s. She was recently in hospital from 10/20/2019 through 10/28/2019 with right leg cellulitis. She was given Ancef and clindamycin and then Zosyn when a culture showed Pseudomonas. At that time there was purulent drainage. She was followed by infectious disease Dr Joylene Draft. The patient is now back at home. She has noted increased swelling in the right and no drainage in her right leg mostly on the posterior medial aspect in the calf area. She has not had pain or fever. She has literally been improved lysing above dressings because her at the area of this is far too large for standard compression. She has been wrapping the areas with sheets to  resorptive pads. She is found these helped somewhat. She does have an appointment with the lymphedema clinic in [place] in late June. Past medical history includes bilateral lymphedema, hypertension, obstructive sleep apnea with CPAP. Recent hospitalization with apparently Pseudomonas cellulitis of the right lower leg 12/15/2019 upon evaluation today patient appears to be doing a little bit worse in regard to her right lower extremity. Unfortunately she is having more weeping down in the lower portion of her leg. Fortunately there is no signs of active infection at this time. No fever chills noted. The patient states she is not having increased pain except for when she attempted to use the lymphedema pumps unfortunately she states that she did have pain when she did this. Otherwise we been using absorptive dressings of one type or another she is using diapers at home and then subsequently Ace wraps. In regard to the barrier cream we have discussed the possibility of derma cloud which she would like to  try I do not have a problem with that. 12/22/2019 upon evaluation today patient actually appears to be doing better in regard to her leg ulcers at this point. Fortunately there does not appear to be any signs of active infection which is great news and I am extremely pleased with where things are progressing at this time. There is no sign of active infection currently. The patient is very pleased to see things doing so well. 12/29/2019 upon evaluation today patient appears to be doing a little bit better in regard to her weeping in general over her lower extremities. She does have some signs of mild erythema little bit more than what I noted last week or rather last visit. Nonetheless I think that my threshold for switching her antibiotics from Keflex to something else is very low at this point considering that she has had such severe infections in the past that seem to come almost out of nowhere. There is a little erythema and warmth noted of the lower portion of her leg compared to the upper which also makes me want to go ahead and address things more rapidly at this point. Likely I would switch out the Keflex for something like Levaquin ideally. 7/16; patient with severe bilateral lymphedema. She has superficial wounds albeit almost circumferential now on the left lateral lower leg. This may be new from last time. Small area on the right anterior lower leg and then another area on the right medial lower leg and of pannus fold. She has been using various absorptive garments. She states she is using her compression pumps once a day occasionally twice. Culture from her last visit here was negative 01/29/2020 on evaluation today patient appears to be doing excellent at this point in regard to her legs with regard to infection I see no signs of active infection at this point. She still does have unfortunately areas of weeping this is minimal on the right now her left is actually significantly worse  although I do not think it is as bad as last week with Dr. Dellia Nims saw her. She has been trying to pump and elevate her legs is much as possible. She has previously been on the Keflex and in the past for prevention that seems to do fairly well and likely can extend that today. 02/04/2020 on evaluation today patient appears to be doing better in regard to her legs bilaterally. Fortunately there is no signs of active infection at this time which is great news and overall she has less  weeping on the left compared to the right and there is several spots where she is pretty much sealed up with no draining regions. Overall very pleased in this regard. 02/19/2020 on evaluation today patient appears to be doing very well in regard to her wounds currently. Fortunately there is no evidence of active infection overall very pleased with where things stand. She is significantly improved in regard to her edema I am extremely pleased in this regard she tells me that the popping no longer hurts and in fact she actually looks forward to it. 03/04/2020 on evaluation today patient appears to be doing excellent in regard to her lower extremities. Fortunately there is no signs of active infection at this time. No fevers, chills, nausea, vomiting, or diarrhea. 03/25/2020 on evaluation today patient appears to be doing a little bit more poorly in regard to her legs at this point. She tells me that she is still continue to have issues with drainage and this has been a little bit worse she was getting ready to start taking the Keflex again but wanted to see me first. Fortunately there is no signs of active infection at this time. No fevers, chills, nausea, vomiting, or diarrhea. 04/15/2020 upon evaluation today patient appears to be doing somewhat poorly in regard to her right leg. She tells me she has been having more pain she has been taking the Keflex that was previously prescribed unfortunately that just does not seem to help  with this. She was hoping that the pain on her right was actually coming from the fact that she was having issues with her wrap having gotten caught in her recliner. With that being said she tells me that she knew something was not right. Currently her right leg is warm to touch along with being erythematous all the way up to around at least mid thigh as far as I can see. The left leg does not appear to be doing that badly though there is increased weeping around the ankle region. 05/06/2020 on evaluation today patient appears to be doing much better than last time I saw her. She did go to the hospital where she was admitted for 2 days and treated with antibiotic therapy. She was discharged with antibiotics as well and has done extremely well. I am extremely pleased with where things stand today. There is no signs of active infection at this time which is great news. 05/27/2020 upon evaluation today patient appears to be doing well with regard to her lower extremities bilaterally. She has just a very tiny area on the right leg which is opening on the left leg she is significantly improved though she still has several areas that do appear to be open this is minimal compared to what is been in the past. In general I am extremely pleased with where things stand today. The patient does tell me she is not been using her pumps quite as much as she should be. I do believe that is 1 area she can definitely work on. She has had a lot going on including a Covid exposure and apparently also a outbreak of likely shingles. ARMONII, SIEH ([id number]) 06/24/2020 upon evaluation today patient appears to be doing well with regard to her legs in general although the left leg unfortunately is showing some signs of erythema she does have a little bit of increased weeping and to be honest I am concerned about infection here. I discussed that with her today and I think that we  may need to address this sooner rather  than later she has been taking Keflex she is not really certain that is been making a big improvement however. No fevers, chills, nausea, vomiting, or diarrhea. 06/30/2020 upon evaluation today patient's legs actually seem to be doing better in my opinion as compared to where they were last week. Fortunately there does not appear to be any signs of active infection. Her culture showed multiple organisms nothing predominate. With that being said the Levaquin seems to have done well I think she has improved since I last saw her as well. Objective Constitutional Obese and well-hydrated in no acute distress. Vitals Time Taken: 1:59 PM, Height: 63 in, Weight: 370 lbs, BMI: 65.5, Temperature: 98.1 F, Pulse: 64 bpm, Respiratory Rate: 16 breaths/min, Blood Pressure: 151/91 mmHg. Respiratory normal breathing without difficulty. Psychiatric this patient is able to make decisions and demonstrates good insight into disease process. Alert and Oriented x 3. pleasant and cooperative. General Notes: She has wounds again showed signs of being less extensive on the left lower extremity and right lower extremity but especially the left and I am very pleased to see this. There does not appear to be any signs of active infection at this time which is great news overall I think she should complete the Levaquin although I do believe she is doing quite well all things considered. Other Condition(s) Patient presents with Lymphedema located on the Left Leg. Patient presents with Lymphedema located on the Right Leg. 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 Follow-up Appointments: Return Appointment in 1 month Edema Control: Elevate legs to the level of the heart and pump ankles as often as possible Bejar, Alianah C. ([id number]) Compression Pump: Use compression pump on left lower extremity for 60 minutes, twice daily. Compression Pump: Use compression pump on right lower extremity for 60 minutes, twice daily. Additional Orders / Instructions: Activity as tolerated Other: - Do your best to sleep in the bed. Do not spend all your time in the recliner. 1. I would recommend currently that we going to continue with the wound care measures as before the patient it is agreement with the plan. This includes the use of the Chux pads in order to catch any drainage. 2. Also can recommend she complete the Levaquin I think that is absolutely appropriate. 3. She will also continue to use the Ace wraps on the bilateral lower extremities to try to hold everything in place and hopefully help with getting some the fluid out along with the use of her lymphedema pumps. She is also try to sleep in the bed if at all possible. We will see patient back for reevaluation in 1 month here in the clinic. If anything worsens or changes patient will contact our office for additional recommendations. Electronic Signature(s) Signed: 06/30/2020 2:38:42 PM By: Lenda Kelp PA-C Entered By: Lenda Kelp on 06/30/2020 14:38:41 Bells, Ronda Fairly ([id number]) -------------------------------------------------------------------------------- SuperBill Details Patient Name: [name] Date of Service: 06/30/2020 Medical Record Number: [id number] Patient Account Number: [id number] Date of Birth/Sex: 07/06/1956 (  64 y.o. F) Treating RN: Huel Coventry Primary Care Provider: Dala Dock Other  Clinician: Referring Provider: Dala Dock Treating Provider/Extender: Rowan Blase in Treatment: 30 Diagnosis Coding ICD-10 Codes Code Description Q82.0 Hereditary lymphedema L97.811 Non-pressure chronic ulcer of other part of right lower leg limited to breakdown of skin L97.821 Non-pressure chronic ulcer of other part of left lower leg limited to breakdown of skin Facility Procedures CPT4 Code: 16109604 Description: 99213 - WOUND CARE VISIT-LEV 3 EST PT Modifier: Quantity: 1 Physician Procedures CPT4 Code: 5409811 Description: 99213 - WC PHYS LEVEL 3 - EST PT Modifier: Quantity: 1 CPT4 Code: Description: ICD-10 Diagnosis Description Q82.0 Hereditary lymphedema L97.811 Non-pressure chronic ulcer of other part of right lower leg limited to bre L97.821 Non-pressure chronic ulcer of other part of left lower leg limited to brea Modifier: akdown of skin kdown of skin Quantity: Electronic Signature(s) Signed: 06/30/2020 2:38:54 PM By: Lenda Kelp PA-C Entered By: Lenda Kelp on 06/30/2020 14:38:53

## 2020-07-14 ENCOUNTER — Other Ambulatory Visit: Payer: Self-pay

## 2020-07-14 ENCOUNTER — Encounter: Payer: Medicaid Other | Attending: Physician Assistant | Admitting: Physician Assistant

## 2020-07-14 DIAGNOSIS — I890 Lymphedema, not elsewhere classified: Secondary | ICD-10-CM | POA: Insufficient documentation

## 2020-07-14 DIAGNOSIS — I10 Essential (primary) hypertension: Secondary | ICD-10-CM | POA: Diagnosis not present

## 2020-07-14 DIAGNOSIS — E669 Obesity, unspecified: Secondary | ICD-10-CM | POA: Diagnosis not present

## 2020-07-14 DIAGNOSIS — G4733 Obstructive sleep apnea (adult) (pediatric): Secondary | ICD-10-CM | POA: Insufficient documentation

## 2020-07-14 DIAGNOSIS — L97811 Non-pressure chronic ulcer of other part of right lower leg limited to breakdown of skin: Secondary | ICD-10-CM | POA: Diagnosis not present

## 2020-07-14 DIAGNOSIS — E11622 Type 2 diabetes mellitus with other skin ulcer: Secondary | ICD-10-CM | POA: Insufficient documentation

## 2020-07-14 DIAGNOSIS — Z6841 Body Mass Index (BMI) 40.0 and over, adult: Secondary | ICD-10-CM | POA: Insufficient documentation

## 2020-07-14 DIAGNOSIS — L97821 Non-pressure chronic ulcer of other part of left lower leg limited to breakdown of skin: Secondary | ICD-10-CM | POA: Insufficient documentation

## 2020-07-14 NOTE — Progress Notes (Addendum)
[name] ([id number]) Visit Report for 07/14/2020 Chief Complaint Document Details Patient Name: [name], [name]. Date of Service: 07/14/2020 1:45 PM Medical Record Number: [id number] Patient Account Number: [id number] Date of Birth/Sex: 01-04-1956 (64 y.o. F) Treating RN: Huel Coventry Primary Care Provider: Dala Dock Other Clinician: Referring Provider: Dala Dock Treating Provider/Extender: Rowan Blase in Treatment: 32 Information Obtained from: Patient Chief Complaint Bilateral LE lymphedema Electronic Signature(s) Signed: 07/14/2020 2:08:04 PM By: Lenda Kelp PA-C Entered By: Lenda Kelp on 07/14/2020 14:08:03 Fitzgibbons, Ronda Fairly ([id number]) -------------------------------------------------------------------------------- HPI Details Patient Name: [name] Date of Service: 07/14/2020 1:45 PM Medical Record Number: [id number] Patient Account Number: [id number] Date of Birth/Sex: [birth year] (64 y.o. F) Treating RN: Huel Coventry Primary Care Provider: Dala Dock Other Clinician: Referring Provider: Dala Dock Treating Provider/Extender: Rowan Blase in Treatment: 32 History of Present Illness HPI Description: The patient is a 65 year old female with history of hypertension and a long-standing history of bilateral lower extremity lymphedema (first presented on 4/2) . She has had open ulcers in the past which have always responded to compression therapy. She had briefly been to a lymphedema clinic in the past which helped her at the time. this time around she stopped treatment of her lymphedema pumps approximately 2 weeks ago because of some pain in the knees and then noticed the right leg getting worse. She was seen by her PCP who put her on clindamycin 4 times a day 2 days ago. The patient has seen [facility] and Dr. Gilda Crease had seen her last year where a vascular study including venous and arterial duplex studies were within normal limits. he had  recommended compression stockings and lymphedema pumps and the patient has been using this in about 2 weeks ago. She is known to be diabetic but in the past few time she's gone to her primary care doctor her hemoglobin A1c has been normal. 02/11/2015 - after her last visit she took my advice and went to the ER regarding the progressive cellulitis of her right lower extremity and she was admitted between July 17 and 22nd. She received IV antibiotics and then was sent home on a course of steroid-induced and oral antibiotics. She has improved much since then. 02/17/2015 -- she has been doing fine and the weeping of her legs has remarkably gone down. She has no fresh issues. READMISSION 01/15/18 This patient was given this clinic before most recently in 2016 seen by Dr. Meyer Russel. She has massive bilateral lymphedema and over the last 2 months this had weeping edema out of the left leg. She has compression pumps but her compliance with these has been minimal. She has [facility] they've been using TCA/ABDs/kerlix under an Ace wrap.she has had recent problems with cellulitis. She was apparently seen in the ER and 12/23/17 and given clindamycin. She was then followed by her primary doctor and given doxycycline and Keflex. The pain seems to have settled down. In April 2018 the patient had arterial studies done at [facility]. This showed triphasic waveforms throughout the right leg and mostly triphasic waveforms on the left except for monophasic at the posterior tibial artery distally. She was not felt to have evidence of right lower extremity arterial stenosis or significant problems on the left side. 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 She also had a right lower extremity venous Doppler in January 2018 this was limited by the patient's body habitus and lymphedema. Most of the proximal veins  were not visualized The patient presents with an area of denuded  skin on the anterior medial part of the left calf. There is weeping edema fluid here. 01/22/18; the patient has somewhat better edema control using her compression pumps twice a day and as a result she has much better epithelialization on the left anterior calf area. Only a small open area remains. 01/29/18; the patient has been compliant with her compression pumps. Both the areas on her calf that healed. The remaining area on the left anterior leg is fully epithelialized Readmission: 02/20/2019 upon evaluation today patient presents for reevaluation due to issues that she is having with the bilateral lower extremities. She actually has wounds open on both legs. On the right she has an area in the crease of her leg on the right around the knee region which is actually draining quite a bit and actually has some fungal type appearance to it. She has been on nystatin powder that seems to have helped to some degree. In regard to the left lower extremity this is actually in the lower portion of her leg closer to the ankle and again is continuing to drain as well unfortunately. There does not appear to be any signs of active infection at this time which is good news. No fevers, chills, nausea, vomiting, or diarrhea. She tells me that since she was seen last year she is actually been doing quite well for the most part with regard to her lower extremities. Unfortunately she now is experiencing a little bit more drainage at this time. She is concerned about getting this under control so that it does not get significantly worse. 02/27/2019 on evaluation today patient appears to be doing somewhat better in regard to her bilateral lower extremity wounds. She has been tolerating the dressing changes without complication. Fortunately there is no signs of active infection at this point. No fevers, chills, nausea, vomiting, or diarrhea. She did get her dressing supplies which is excellent news she was extremely excited  to get these. She also got paperwork from [employer] for their financial assistance program where they may be able to help her out in the future if needed with supplies at discounted prices. 03/06/2019 on evaluation today patient appears to be doing a little worse with regard to both areas of weeping on her bilateral lower extremities. This is around the right medial knee and just above the left ankle. With that being said she is unfortunately not doing as well as I would like to see. I feel like she may need to potentially go see someone at the lymphedema clinic as the wraps that she needs or even beyond what we can do here at the wound care center. She really does not have wounds she just has open areas of weeping that are causing some difficulty for her. Subsequently because of this and the moisture I am concerned about the potential for infection I am going to likely give her a prophylactic antibiotic today, Keflex, just to be on the safe side. Nonetheless again there is no obvious signs of active infection at this time. 03/13/2019 on evaluation today patient appears to be doing well with regard to her bilateral lower extremities where she has been weeping compared to even last week's evaluation. I see some areas of new skin growth which is excellent and overall I am very pleased with how things seem to be progressing. No fevers, chills, nausea, vomiting, or diarrhea. TRACIE, BROSE ([id number]) 03/20/2019 on evaluation today patient  unfortunately is continuing to have issues with significant edema of the left lower extremity. Her right side seems to be doing much better. Unfortunately her left side is showing increased weeping of the lower portion of her leg. This is quite unfortunate obviously we were hoping to get her into the lymphedema clinic they really do not seem to when I see her how if she is draining. Despite the fact this is really not wound related but more lymphedema weeping related.  Nonetheless I do not know that this can be helpful for her to even go for that appointment since again I am not sure there is much that they would actually do at this point. We may need to try a 4 layer compression wrap as best we can on her leg. She is on the Augmentin currently although I am still concerned about whether or not there could be potentially something going on infection wise I would obtain a culture though I understand is not the best being that is a surface culture I just 1 to make sure I do not seem to be missing anything. 03/27/2019 on evaluation today patient appears to be doing much better in regard to the left lower extremity compared to last week. Last week she had tremendous weeping which I think was subsequent to infection now she seems to be doing much better and very pleased. This is not completely healed but there is a lot of new skin growth and it has dried out quite a bit. Overall I think that we are doing well with how things are moving along at this time. No fevers, chills, nausea, vomiting, or diarrhea. 04/03/2019 evaluation today patient appears to be doing a little worse this week compared to last time I saw her. I think this may be due to the fact that she is having issues with not being able to sleep in her bed at least not until last night. She is therefore been in a lift chair and subsequently has also had issues with not been able to use her pumps since she could not get in bed. With that being said the patient overall seems to be doing okay I do think I may want extend the antibiotic for a little bit longer at least until we can see if her edema and her weeping gets better and if it is then obviously I can always discontinue the antibiotics as of next week however I want her to continue to have it over the next week. 04/10/2019 on evaluation today patient unfortunately is still doing poorly with regard to her left lower extremity. Her right is all things  considering doing fairly well. On the left however she continues to have spreading of the area of infection and weeping which appears to be even a larger surface area than noted last week. She did have a positive culture for Pseudomonas in particular which seems to have been of concern she still has green/yellow discharge consistent with Pseudomonas and subsequently a tremendous amount of it. This has me obviously still concerned about the infection not really clearing up despite the fact that on culture it appears the Cipro should have been a good option for treating this. I think she may at this point need IV antibiotics since things are not doing better I do not want to get worse and cause sepsis. She is in agreement with the plan and believes as well that she likely does need to go to the hospital for IV vancomycin.  Or something of the like depending on what the recommendation is from the ER. 04/17/2019 on evaluation today patient appears to be doing excellent in regard to her lower extremity on the left. She was in the hospital for several days from when I sent her last we saw her until just this past Tuesday. Fortunately her drainage is significantly improved and in fact is mostly clear. There is just a couple small areas that may still drain a little bit she states that the [facility] they prescribed for her at discharge she went picked up from pharmacy and got home but has not been able to find it since. She is looked everywhere. She is wondering if I will replace that for her today I will be more than happy to do that. 05/01/2019 on evaluation today patient actually appears to be doing quite well with regard to her lower extremities. She occasionally is having areas that will leak and then heal up mainly when a piece of the fibrotic skin pops off but fortunately she is not having any signs of active infection at this time. Overall she also really does not have any obvious weeping at this time. I  do believe however she really needs some compression wraps and I think this may be a good time to get her back to the lymphedema clinic. 05/11/2019 on evaluation today patient actually appears to be doing quite well with regard to her bilateral lower extremities. She occasionally will have a small area that we per another but in general seems to be completely healed which is great news. Overall very pleased with how everything seems to be progressing. She does have her appointment with lymphedema clinic on November 18. 05/25/2019 on evaluation today patient appears to be doing well with regard to her left lower extremity. I am very pleased in this regard. In regard to her right leg this actually did start draining more I think it is mainly due to the fact that her leg is more swollen. I am not seeing any obvious signs of infection at this time although that is definitely something were obviously acutely aware of simply due to the fact that she had an issue not too far back with exactly this issue. Nonetheless I do feel like that lymphedema clinic would still be beneficial for her. I explained obviously if they are not able to do anything treatment wise on the right leg we could at least have them treat her left leg and then proceed from there. The patient is really in agreement with that plan. If they are able to do both as the drainage slows down that I would be happy to let them handle both. 06/01/2019 on evaluation today patient unfortunately appears to be doing worse with regard to her right lower extremity. The left lower extremity is still maintaining at this point. Unfortunately she has been having significantly increased pain over the past several days and has been experiencing as well increased swelling of the right lower extremity. I really do not know that I am seeing anything that appears to be obvious for infection at this point to be peripherally honest. With that being said the patient  does seem to be having much more swelling that she is even experienced in the past and coupled with increased pain in her hip as well I am concerned that again she could potentially have a DVT although I am not 100% sure of this. I think it something that may need to be checked  out. We discussed the possibility of sending her for a DVT study through the hospital but unfortunately transportation is an issue if she does have a DVT I do not want her to wait days to be able to get in for that test however if she has this scheduled as an outpatient that is as fast that she will be able to get the test scheduled for transportation purposes. That will also fall on Thanksgiving so subsequently she did actually be looking at either Friday or even next week before we would know anything back from this. That is much too long in my opinion. Subsequent to the amount of discomfort she is experiencing the patient is actually okay with going to the ER for evaluation today. 06/12/2019 on evaluation today patient actually appears to be doing significantly better compared to last time I saw her. Following when I last saw her she was actually in the hospital from that Monday until the following Sunday almost 1 full week. She actually was placed on Keflex in the hospital following the time for her to be discharged and Dr. Joylene Draft has recommended 2 times a day dosing of the Keflex for the next year in order to help with more prophylactic/preventative measures with regard to her developing cellulitis. Overall I think this sounds like an excellent plan. The patient unfortunately is good to have trouble being treated at lymphedema clinic due to the fact that she really cannot get up on the bed that they have there. They also state that they cannot manage her as long as she has anything draining at this point. Obviously that is somewhat unfortunate as she does need help with edema control but nonetheless we will have to do  what we can for her outside of it sounds like the lymphedema clinic scenario at this point. 06/19/2019 on evaluation today patient appears to be doing fairly well with regard to her bilateral lower extremities. She is not nearly as swollen and shows no signs of infection at this point. There is no evidence of cellulitis whatsoever. She also has no open wounds or draining at this point which is also good news. No fever chills noted. She seems to be in very good spirits and in fact appears to be doing quite well. READMISSION 11/27/2019 ANAISSA, MCFIELD ([id number]) This is a 65 year old woman that we have had in this clinic several times before including 2015, 16 and 19 and then most recently from 03/20/2019 through 06/19/2019 with bilateral lower extremity lymphedema. She has had previous arterial and reflux studies done years ago which were not all that remarkable. 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 She does have a positive family history and she had large legs starting may be in her 66s. She was recently in hospital from 10/20/2019 through 10/28/2019 with right leg cellulitis. She was given Ancef and clindamycin and then Zosyn when a culture showed Pseudomonas. At that time there was purulent drainage. She was followed by infectious disease Dr Joylene Draft. The patient is now back at home. She has noted increased swelling in the right and no drainage in her right leg mostly on the posterior medial aspect in the calf area. She has not had pain or fever. She has literally been improved lysing above dressings because her at the area of this is far too large for standard compression. She has been wrapping the areas with sheets to resorptive pads. She is found these helped somewhat. She  does have an appointment with the lymphedema clinic in [place] in late June. Past medical history includes bilateral lymphedema, hypertension, obstructive  sleep apnea with CPAP. Recent hospitalization with apparently Pseudomonas cellulitis of the right lower leg 12/15/2019 upon evaluation today patient appears to be doing a little bit worse in regard to her right lower extremity. Unfortunately she is having more weeping down in the lower portion of her leg. Fortunately there is no signs of active infection at this time. No fever chills noted. The patient states she is not having increased pain except for when she attempted to use the lymphedema pumps unfortunately she states that she did have pain when she did this. Otherwise we been using absorptive dressings of one type or another she is using diapers at home and then subsequently Ace wraps. In regard to the barrier cream we have discussed the possibility of derma cloud which she would like to try I do not have a problem with that. 12/22/2019 upon evaluation today patient actually appears to be doing better in regard to her leg ulcers at this point. Fortunately there does not appear to be any signs of active infection which is great news and I am extremely pleased with where things are progressing at this time. There is no sign of active infection currently. The patient is very pleased to see things doing so well. 12/29/2019 upon evaluation today patient appears to be doing a little bit better in regard to her weeping in general over her lower extremities. She does have some signs of mild erythema little bit more than what I noted last week or rather last visit. Nonetheless I think that my threshold for switching her antibiotics from Keflex to something else is very low at this point considering that she has had such severe infections in the past that seem to come almost out of nowhere. There is a little erythema and warmth noted of the lower portion of her leg compared to the upper which also makes me want to go ahead and address things more rapidly at this point. Likely I would switch out the Keflex for  something like Levaquin ideally. 7/16; patient with severe bilateral lymphedema. She has superficial wounds albeit almost circumferential now on the left lateral lower leg. This may be new from last time. Small area on the right anterior lower leg and then another area on the right medial lower leg and of pannus fold. She has been using various absorptive garments. She states she is using her compression pumps once a day occasionally twice. Culture from her last visit here was negative 01/29/2020 on evaluation today patient appears to be doing excellent at this point in regard to her legs with regard to infection I see no signs of active infection at this point. She still does have unfortunately areas of weeping this is minimal on the right now her left is actually significantly worse although I do not think it is as bad as last week with Dr. Leanord Hawking saw her. She has been trying to pump and elevate her legs is much as possible. She has previously been on the Keflex and in the past for prevention that seems to do fairly well and likely can extend that today. 02/04/2020 on evaluation today patient appears to be doing better in regard to her legs bilaterally. Fortunately there is no signs of active infection at this time which is great news and overall she has less weeping on the left compared to the right and  there is several spots where she is pretty much sealed up with no draining regions. Overall very pleased in this regard. 02/19/2020 on evaluation today patient appears to be doing very well in regard to her wounds currently. Fortunately there is no evidence of active infection overall very pleased with where things stand. She is significantly improved in regard to her edema I am extremely pleased in this regard she tells me that the popping no longer hurts and in fact she actually looks forward to it. 03/04/2020 on evaluation today patient appears to be doing excellent in regard to her lower  extremities. Fortunately there is no signs of active infection at this time. No fevers, chills, nausea, vomiting, or diarrhea. 03/25/2020 on evaluation today patient appears to be doing a little bit more poorly in regard to her legs at this point. She tells me that she is still continue to have issues with drainage and this has been a little bit worse she was getting ready to start taking the Keflex again but wanted to see me first. Fortunately there is no signs of active infection at this time. No fevers, chills, nausea, vomiting, or diarrhea. 04/15/2020 upon evaluation today patient appears to be doing somewhat poorly in regard to her right leg. She tells me she has been having more pain she has been taking the Keflex that was previously prescribed unfortunately that just does not seem to help with this. She was hoping that the pain on her right was actually coming from the fact that she was having issues with her wrap having gotten caught in her recliner. With that being said she tells me that she knew something was not right. Currently her right leg is warm to touch along with being erythematous all the way up to around at least mid thigh as far as I can see. The left leg does not appear to be doing that badly though there is increased weeping around the ankle region. 05/06/2020 on evaluation today patient appears to be doing much better than last time I saw her. She did go to the hospital where she was admitted for 2 days and treated with antibiotic therapy. She was discharged with antibiotics as well and has done extremely well. I am extremely pleased with where things stand today. There is no signs of active infection at this time which is great news. 05/27/2020 upon evaluation today patient appears to be doing well with regard to her lower extremities bilaterally. She has just a very tiny area on the right leg which is opening on the left leg she is significantly improved though she still has  several areas that do appear to be open this is minimal compared to what is been in the past. In general I am extremely pleased with where things stand today. The patient does tell me she is not been using her pumps quite as much as she should be. I do believe that is 1 area she can definitely work on. She has had a lot going on including a Covid exposure and apparently also a outbreak of likely shingles. 06/24/2020 upon evaluation today patient appears to be doing well with regard to her legs in general although the left leg unfortunately is showing some signs of erythema she does have a little bit of increased weeping and to be honest I am concerned about infection here. I discussed that with her today and I think that we may need to address this sooner rather than later she has been  taking Keflex she is not really certain that is been making a big improvement however. No fevers, chills, nausea, vomiting, or diarrhea. HASLEY, LINDENMUTH ([id number]) 06/30/2020 upon evaluation today patient's legs actually seem to be doing better in my opinion as compared to where they were last week. Fortunately there does not appear to be any signs of active infection. Her culture showed multiple organisms nothing predominate. With that being said the Levaquin seems to have done well I think she has improved since I last saw her as well. 07/14/2020 upon evaluation today patient appears to be doing actually better in regard to her lower extremities in my opinion. She has been tolerating the dressing changes without complication. Fortunately there is no signs of active infection at this time. Electronic Signature(s) Signed: 07/14/2020 3:06:26 PM By: Lenda Kelp PA-C Entered By: Lenda Kelp on 07/14/2020 15:06:26 Parr, Ronda Fairly ([id number]) -------------------------------------------------------------------------------- Physical Exam Details Patient Name: SHAVAUGHN, WENTWORTH C. Date of Service: 07/14/2020 1:45  PM Medical Record Number: [id number] Patient Account Number: [id number] Date of Birth/Sex: [birth year] (64 y.o. F) Treating RN: Huel Coventry Primary Care Provider: Dala Dock Other Clinician: Referring Provider: Dala Dock Treating Provider/Extender: Rowan Blase in Treatment: 20 Constitutional Well-nourished and well-hydrated in no acute distress. Respiratory normal breathing without difficulty. Psychiatric this patient is able to make decisions and demonstrates good insight into disease process. Alert and Oriented x 3. pleasant and cooperative. Notes Inspection patient's wounds again showed signs of improvement she has very little weeping and drainage at this point which is good news. There is no signs of active infection at this time. No fevers, chills, nausea, vomiting, or diarrhea. Electronic Signature(s) Signed: 07/14/2020 3:06:51 PM By: Lenda Kelp PA-C Entered By: Lenda Kelp on 07/14/2020 15:06:50 Bontrager, Ronda Fairly ([id number]) -------------------------------------------------------------------------------- Physician Orders Details Patient Name: [name] Date of Service: 07/14/2020 1:45 PM Medical Record Number: [id number] Patient Account Number: [id number] Date of Birth/Sex: 06-Dec-1955 (64 y.o. 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 o Ace wraps - Chux secured with ace wraps o Compression Pump: Use compression pump on left lower extremity for 60 minutes, twice daily. o Compression Pump: Use compression pump on right lower extremity for 60 minutes, twice daily. o DO YOUR BEST to sleep in the bed at night. DO NOT sleep in your recliner. Long hours of sitting in a recliner leads to swelling of the legs and/or potential wounds on your backside. Electronic Signature(s) Signed: 07/15/2020 8:19:26 AM By: Elliot Gurney, BSN, RN, CWS, Kim RN, BSN Signed: 07/15/2020 4:17:09 PM By: Lenda Kelp PA-C Entered By: Elliot Gurney, BSN, RN, CWS, Kim on 07/15/2020 08:19:25 [name] ([id number]) -------------------------------------------------------------------------------- Problem List Details Patient Name: ALLE, CELLI. Date of Service: 07/14/2020 1:45 PM Medical Record Number: [id number] Patient Account Number: [id number] Date of Birth/Sex: January 22, 1956 (64  y.o. 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 Date of Service: 07/14/2020 1:45 PM Medical Record Number: [id number] Patient Account Number: [id number] Date of Birth/Sex: [birth year] (64 y.o. F) Treating RN: Huel Coventry Primary Care Provider: Dala Dock Other Clinician: Referring Provider: Dala Dock Treating Provider/Extender: Rowan Blase in Treatment: 30 Subjective Chief Complaint Information obtained from Patient Bilateral LE lymphedema History of Present Illness (HPI) The patient is a 65 year old female with history of hypertension and a long-standing history of bilateral lower extremity lymphedema (first presented on 4/2) . She has had open ulcers in the past which have always responded to compression therapy. She had briefly been to a lymphedema clinic in the past which helped her at the time. this time around she stopped treatment of her lymphedema pumps approximately 2 weeks ago because of some pain in the knees and then noticed the right leg getting worse. She was seen by her PCP who put her on clindamycin 4 times a day 2 days ago. The patient has seen [facility] and Dr. Gilda Crease had seen her last year where a vascular study including venous and arterial duplex studies were within normal limits. he had recommended compression stockings and lymphedema pumps and the patient has been using this in about 2 weeks ago. She is known to be diabetic but in the past few time she's gone to her primary care doctor her hemoglobin A1c has been normal. 02/11/2015 - after her last visit she took my advice and went to the ER regarding the progressive cellulitis of her right lower extremity and she was admitted between July 17 and 22nd. She received IV antibiotics and then was sent home on a course of steroid-induced and oral antibiotics. She has improved much since then. 02/17/2015 -- she has been doing fine and the weeping of her legs has remarkably gone down. She has no fresh  issues. READMISSION 01/15/18 This patient was given this clinic before most recently in 2016 seen by Dr. Meyer Russel. She has massive bilateral lymphedema and over the last 2 months this had weeping edema out of the left leg. She has compression pumps but her compliance with these has been minimal. She has [facility] they've been using TCA/ABDs/kerlix under an Ace wrap.she has had recent problems with cellulitis. She was apparently seen in the ER and 12/23/17 and given clindamycin. She was then followed by her primary doctor and given doxycycline and Keflex. The pain seems to have settled down. In April 2018 the patient had arterial studies done at [facility]. This showed triphasic waveforms throughout the right leg and mostly triphasic waveforms on the left except for monophasic at the posterior tibial artery distally. She was not felt to have evidence of right lower extremity arterial stenosis or significant problems on the left side. 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 She also had  a right lower extremity venous Doppler in January 2018 this was limited by the patient's body habitus and lymphedema. Most of the proximal veins were not visualized The patient presents with an area of denuded skin on the anterior medial part of the left calf. There is weeping edema fluid here. 01/22/18; the patient has somewhat better edema control using her compression pumps twice a day and as a result she has much better epithelialization on the left anterior calf area. Only a small open area remains. 01/29/18; the patient has been compliant with her compression pumps. Both the areas on her calf that healed. The remaining area on the left anterior leg is fully epithelialized Readmission: 02/20/2019 upon evaluation today patient presents for reevaluation due to issues that she is having with the bilateral lower extremities. She actually has wounds open on both legs. On  the right she has an area in the crease of her leg on the right around the knee region which is actually draining quite a bit and actually has some fungal type appearance to it. She has been on nystatin powder that seems to have helped to some degree. In regard to the left lower extremity this is actually in the lower portion of her leg closer to the ankle and again is continuing to drain as well unfortunately. There does not appear to be any signs of active infection at this time which is good news. No fevers, chills, nausea, vomiting, or diarrhea. She tells me that since she was seen last year she is actually been doing quite well for the most part with regard to her lower extremities. Unfortunately she now is experiencing a little bit more drainage at this time. She is concerned about getting this under control so that it does not get significantly worse. 02/27/2019 on evaluation today patient appears to be doing somewhat better in regard to her bilateral lower extremity wounds. She has been tolerating the dressing changes without complication. Fortunately there is no signs of active infection at this point. No fevers, chills, nausea, vomiting, or diarrhea. She did get her dressing supplies which is excellent news she was extremely excited to get these. She also got paperwork from [employer] for their financial assistance program where they may be able to help her out in the future if needed with supplies at discounted prices. 03/06/2019 on evaluation today patient appears to be doing a little worse with regard to both areas of weeping on her bilateral lower extremities. This is around the right medial knee and just above the left ankle. With that being said she is unfortunately not doing as well as I would like to see. I feel like she may need to potentially go see someone at the lymphedema clinic as the wraps that she needs or even beyond what we can do here at the wound care center. She really does not  have wounds she just has open areas of weeping that are causing some difficulty for her. Subsequently because of this and the moisture I am concerned about the potential for infection I am going to likely give her a prophylactic antibiotic today, Keflex, just to be on the safe side. Nonetheless again there is no obvious signs of active infection at this time. BRENDALYNN, DANESI ([id number]) 03/13/2019 on evaluation today patient appears to be doing well with regard to her bilateral lower extremities where she has been weeping compared to even last week's evaluation. I see some areas of new skin growth which is excellent and  overall I am very pleased with how things seem to be progressing. No fevers, chills, nausea, vomiting, or diarrhea. 03/20/2019 on evaluation today patient unfortunately is continuing to have issues with significant edema of the left lower extremity. Her right side seems to be doing much better. Unfortunately her left side is showing increased weeping of the lower portion of her leg. This is quite unfortunate obviously we were hoping to get her into the lymphedema clinic they really do not seem to when I see her how if she is draining. Despite the fact this is really not wound related but more lymphedema weeping related. Nonetheless I do not know that this can be helpful for her to even go for that appointment since again I am not sure there is much that they would actually do at this point. We may need to try a 4 layer compression wrap as best we can on her leg. She is on the Augmentin currently although I am still concerned about whether or not there could be potentially something going on infection wise I would obtain a culture though I understand is not the best being that is a surface culture I just 1 to make sure I do not seem to be missing anything. 03/27/2019 on evaluation today patient appears to be doing much better in regard to the left lower extremity compared to last week.  Last week she had tremendous weeping which I think was subsequent to infection now she seems to be doing much better and very pleased. This is not completely healed but there is a lot of new skin growth and it has dried out quite a bit. Overall I think that we are doing well with how things are moving along at this time. No fevers, chills, nausea, vomiting, or diarrhea. 04/03/2019 evaluation today patient appears to be doing a little worse this week compared to last time I saw her. I think this may be due to the fact that she is having issues with not being able to sleep in her bed at least not until last night. She is therefore been in a lift chair and subsequently has also had issues with not been able to use her pumps since she could not get in bed. With that being said the patient overall seems to be doing okay I do think I may want extend the antibiotic for a little bit longer at least until we can see if her edema and her weeping gets better and if it is then obviously I can always discontinue the antibiotics as of next week however I want her to continue to have it over the next week. 04/10/2019 on evaluation today patient unfortunately is still doing poorly with regard to her left lower extremity. Her right is all things considering doing fairly well. On the left however she continues to have spreading of the area of infection and weeping which appears to be even a larger surface area than noted last week. She did have a positive culture for Pseudomonas in particular which seems to have been of concern she still has green/yellow discharge consistent with Pseudomonas and subsequently a tremendous amount of it. This has me obviously still concerned about the infection not really clearing up despite the fact that on culture it appears the Cipro should have been a good option for treating this. I think she may at this point need IV antibiotics since things are not doing better I do not want to get  worse and cause sepsis.  She is in agreement with the plan and believes as well that she likely does need to go to the hospital for IV vancomycin. Or something of the like depending on what the recommendation is from the ER. 04/17/2019 on evaluation today patient appears to be doing excellent in regard to her lower extremity on the left. She was in the hospital for several days from when I sent her last we saw her until just this past Tuesday. Fortunately her drainage is significantly improved and in fact is mostly clear. There is just a couple small areas that may still drain a little bit she states that the [facility] they prescribed for her at discharge she went picked up from pharmacy and got home but has not been able to find it since. She is looked everywhere. She is wondering if I will replace that for her today I will be more than happy to do that. 05/01/2019 on evaluation today patient actually appears to be doing quite well with regard to her lower extremities. She occasionally is having areas that will leak and then heal up mainly when a piece of the fibrotic skin pops off but fortunately she is not having any signs of active infection at this time. Overall she also really does not have any obvious weeping at this time. I do believe however she really needs some compression wraps and I think this may be a good time to get her back to the lymphedema clinic. 05/11/2019 on evaluation today patient actually appears to be doing quite well with regard to her bilateral lower extremities. She occasionally will have a small area that we per another but in general seems to be completely healed which is great news. Overall very pleased with how everything seems to be progressing. She does have her appointment with lymphedema clinic on November 18. 05/25/2019 on evaluation today patient appears to be doing well with regard to her left lower extremity. I am very pleased in this regard. In regard to her right  leg this actually did start draining more I think it is mainly due to the fact that her leg is more swollen. I am not seeing any obvious signs of infection at this time although that is definitely something were obviously acutely aware of simply due to the fact that she had an issue not too far back with exactly this issue. Nonetheless I do feel like that lymphedema clinic would still be beneficial for her. I explained obviously if they are not able to do anything treatment wise on the right leg we could at least have them treat her left leg and then proceed from there. The patient is really in agreement with that plan. If they are able to do both as the drainage slows down that I would be happy to let them handle both. 06/01/2019 on evaluation today patient unfortunately appears to be doing worse with regard to her right lower extremity. The left lower extremity is still maintaining at this point. Unfortunately she has been having significantly increased pain over the past several days and has been experiencing as well increased swelling of the right lower extremity. I really do not know that I am seeing anything that appears to be obvious for infection at this point to be peripherally honest. With that being said the patient does seem to be having much more swelling that she is even experienced in the past and coupled with increased pain in her hip as well I am concerned that again she  could potentially have a DVT although I am not 100% sure of this. I think it something that may need to be checked out. We discussed the possibility of sending her for a DVT study through the hospital but unfortunately transportation is an issue if she does have a DVT I do not want her to wait days to be able to get in for that test however if she has this scheduled as an outpatient that is as fast that she will be able to get the test scheduled for transportation purposes. That will also fall on Thanksgiving so  subsequently she did actually be looking at either Friday or even next week before we would know anything back from this. That is much too long in my opinion. Subsequent to the amount of discomfort she is experiencing the patient is actually okay with going to the ER for evaluation today. 06/12/2019 on evaluation today patient actually appears to be doing significantly better compared to last time I saw her. Following when I last saw her she was actually in the hospital from that Monday until the following Sunday almost 1 full week. She actually was placed on Keflex in the hospital following the time for her to be discharged and Dr. Joylene Draft has recommended 2 times a day dosing of the Keflex for the next year in order to help with more prophylactic/preventative measures with regard to her developing cellulitis. Overall I think this sounds like an excellent plan. The patient unfortunately is good to have trouble being treated at lymphedema clinic due to the fact that she really cannot get up on the bed that they have there. They also state that they cannot manage her as long as she has anything draining at this point. Obviously that is somewhat unfortunate as she does need help with edema control but nonetheless we will have to do what we can for her outside of it sounds like the lymphedema clinic scenario at this point. 06/19/2019 on evaluation today patient appears to be doing fairly well with regard to her bilateral lower extremities. She is not nearly as swollen Staniszewski, Keela C. ([id number]) and shows no signs of infection at this point. There is no evidence of cellulitis whatsoever. She also has no open wounds or draining at this point which is also good news. No fever chills noted. She seems to be in very good spirits and in fact appears to be doing quite well. READMISSION 11/27/2019 This is a 65 year old woman that we have had in this clinic several times before including 2015, 16 and 19 and  then most recently from 03/20/2019 through 06/19/2019 with bilateral lower extremity lymphedema. She has had previous arterial and reflux studies done years ago which were not all that remarkable. 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 She does have a positive family history and she had large legs starting may be in her 50s. She was recently in hospital from 10/20/2019 through 10/28/2019 with right leg cellulitis. She was given Ancef and clindamycin and then Zosyn when a culture showed Pseudomonas. At that time there was purulent drainage. She was followed by infectious disease Dr Joylene Draft. The patient is now back at home. She has noted increased swelling in the right and no drainage in her right leg mostly on the posterior medial aspect in the calf area. She has not had pain or fever. She has literally been improved lysing above dressings because her at the area of this is  far too large for standard compression. She has been wrapping the areas with sheets to resorptive pads. She is found these helped somewhat. She does have an appointment with the lymphedema clinic in [place] in late June. Past medical history includes bilateral lymphedema, hypertension, obstructive sleep apnea with CPAP. Recent hospitalization with apparently Pseudomonas cellulitis of the right lower leg 12/15/2019 upon evaluation today patient appears to be doing a little bit worse in regard to her right lower extremity. Unfortunately she is having more weeping down in the lower portion of her leg. Fortunately there is no signs of active infection at this time. No fever chills noted. The patient states she is not having increased pain except for when she attempted to use the lymphedema pumps unfortunately she states that she did have pain when she did this. Otherwise we been using absorptive dressings of one type or another she is using diapers at home and then  subsequently Ace wraps. In regard to the barrier cream we have discussed the possibility of derma cloud which she would like to try I do not have a problem with that. 12/22/2019 upon evaluation today patient actually appears to be doing better in regard to her leg ulcers at this point. Fortunately there does not appear to be any signs of active infection which is great news and I am extremely pleased with where things are progressing at this time. There is no sign of active infection currently. The patient is very pleased to see things doing so well. 12/29/2019 upon evaluation today patient appears to be doing a little bit better in regard to her weeping in general over her lower extremities. She does have some signs of mild erythema little bit more than what I noted last week or rather last visit. Nonetheless I think that my threshold for switching her antibiotics from Keflex to something else is very low at this point considering that she has had such severe infections in the past that seem to come almost out of nowhere. There is a little erythema and warmth noted of the lower portion of her leg compared to the upper which also makes me want to go ahead and address things more rapidly at this point. Likely I would switch out the Keflex for something like Levaquin ideally. 7/16; patient with severe bilateral lymphedema. She has superficial wounds albeit almost circumferential now on the left lateral lower leg. This may be new from last time. Small area on the right anterior lower leg and then another area on the right medial lower leg and of pannus fold. She has been using various absorptive garments. She states she is using her compression pumps once a day occasionally twice. Culture from her last visit here was negative 01/29/2020 on evaluation today patient appears to be doing excellent at this point in regard to her legs with regard to infection I see no signs of active infection at this point.  She still does have unfortunately areas of weeping this is minimal on the right now her left is actually significantly worse although I do not think it is as bad as last week with Dr. Leanord Hawking saw her. She has been trying to pump and elevate her legs is much as possible. She has previously been on the Keflex and in the past for prevention that seems to do fairly well and likely can extend that today. 02/04/2020 on evaluation today patient appears to be doing better in regard to her legs bilaterally. Fortunately there is no signs  of active infection at this time which is great news and overall she has less weeping on the left compared to the right and there is several spots where she is pretty much sealed up with no draining regions. Overall very pleased in this regard. 02/19/2020 on evaluation today patient appears to be doing very well in regard to her wounds currently. Fortunately there is no evidence of active infection overall very pleased with where things stand. She is significantly improved in regard to her edema I am extremely pleased in this regard she tells me that the popping no longer hurts and in fact she actually looks forward to it. 03/04/2020 on evaluation today patient appears to be doing excellent in regard to her lower extremities. Fortunately there is no signs of active infection at this time. No fevers, chills, nausea, vomiting, or diarrhea. 03/25/2020 on evaluation today patient appears to be doing a little bit more poorly in regard to her legs at this point. She tells me that she is still continue to have issues with drainage and this has been a little bit worse she was getting ready to start taking the Keflex again but wanted to see me first. Fortunately there is no signs of active infection at this time. No fevers, chills, nausea, vomiting, or diarrhea. 04/15/2020 upon evaluation today patient appears to be doing somewhat poorly in regard to her right leg. She tells me she has been  having more pain she has been taking the Keflex that was previously prescribed unfortunately that just does not seem to help with this. She was hoping that the pain on her right was actually coming from the fact that she was having issues with her wrap having gotten caught in her recliner. With that being said she tells me that she knew something was not right. Currently her right leg is warm to touch along with being erythematous all the way up to around at least mid thigh as far as I can see. The left leg does not appear to be doing that badly though there is increased weeping around the ankle region. 05/06/2020 on evaluation today patient appears to be doing much better than last time I saw her. She did go to the hospital where she was admitted for 2 days and treated with antibiotic therapy. She was discharged with antibiotics as well and has done extremely well. I am extremely pleased with where things stand today. There is no signs of active infection at this time which is great news. 05/27/2020 upon evaluation today patient appears to be doing well with regard to her lower extremities bilaterally. She has just a very tiny area on the right leg which is opening on the left leg she is significantly improved though she still has several areas that do appear to be open this is minimal compared to what is been in the past. In general I am extremely pleased with where things stand today. The patient does tell me she is not been using her pumps quite as much as she should be. I do believe that is 1 area she can definitely work on. She has had a lot going on including a Covid exposure and apparently also a outbreak of likely shingles. SHEALEE, MANRRIQUEZ ([id number]) 06/24/2020 upon evaluation today patient appears to be doing well with regard to her legs in general although the left leg unfortunately is showing some signs of erythema she does have a little bit of increased weeping and to be honest I am  concerned about infection here. I discussed that with her today and I think that we may need to address this sooner rather than later she has been taking Keflex she is not really certain that is been making a big improvement however. No fevers, chills, nausea, vomiting, or diarrhea. 06/30/2020 upon evaluation today patient's legs actually seem to be doing better in my opinion as compared to where they were last week. Fortunately there does not appear to be any signs of active infection. Her culture showed multiple organisms nothing predominate. With that being said the Levaquin seems to have done well I think she has improved since I last saw her as well. 07/14/2020 upon evaluation today patient appears to be doing actually better in regard to her lower extremities in my opinion. She has been tolerating the dressing changes without complication. Fortunately there is no signs of active infection at this time. Objective Constitutional Well-nourished and well-hydrated in no acute distress. Vitals Time Taken: 2:15 PM, Height: 63 in, Weight: 370 lbs, BMI: 65.5, Temperature: 97.4 F, Pulse: 65 bpm, Respiratory Rate: 18 breaths/min, Blood Pressure: 145/62 mmHg. Respiratory normal breathing without difficulty. Psychiatric this patient is able to make decisions and demonstrates good insight into disease process. Alert and Oriented x 3. pleasant and cooperative. General Notes: Inspection patient's wounds again showed signs of improvement she has very little weeping and drainage at this point which is good news. There is no signs of active infection at this time. No fevers, chills, nausea, vomiting, or diarrhea. Other Condition(s) Patient presents with Lymphedema located on the Right Leg. General Notes: Patient legs are weeping fluid. Patient states she has been using [employer] on her legs and she feels it is helping with the drainage. Patient presents with Lymphedema located on the Left  Leg. General Notes: Patient legs are weeping fluid. Patient states she has been using [employer] on her legs and she feels it is helping with the drainage. 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 Would recommend currently that we continue with the wound care measures as before were using just [employer] powder which she has found very effective followed by Chux pads in order to catch any drainage. 2. Also can use Ace wraps to secure everything in place. 3. I would recommend she continue to use her pumps and elevate her legs much as possible to try to keep edema under good control. We will see patient back for reevaluation in 2 weeks here in the clinic. If anything worsens or changes patient will contact our office for additional recommendations. HUNTLEE, WINSETT ([id number]) Electronic Signature(s) Signed: 07/14/2020 3:07:51 PM By: Lenda Kelp PA-C Previous Signature: 07/14/2020 3:07:19 PM Version By: Lenda Kelp PA-C Entered By: Lenda Kelp on 07/14/2020 15:07:49 Cupit, Ronda Fairly ([id number]) -------------------------------------------------------------------------------- SuperBill Details Patient Name: [name] Date of Service: 07/14/2020 Medical Record Number: [id number] Patient Account Number: [id number] Date of Birth/Sex: 12-25-1955 (65 y.o. F) Treating RN: Huel Coventry Primary Care Provider: Dala Dock Other Clinician: Referring Provider: Dala Dock Treating Provider/Extender: Rowan Blase in Treatment: 32 Diagnosis Coding ICD-10 Codes Code Description Q82.0 Hereditary lymphedema L97.811 Non-pressure chronic ulcer of other part of right lower leg limited to breakdown of skin L97.821 Non-pressure chronic ulcer of other part of left lower leg limited  to breakdown of  skin Facility Procedures CPT4 Code: 16109604 Description: 99213 - WOUND CARE VISIT-LEV 3 EST PT Modifier: Quantity: 1 Physician Procedures CPT4 Code: 5409811 Description: 99213 - WC PHYS LEVEL 3 - EST PT Modifier: Quantity: 1 CPT4 Code: Description: ICD-10 Diagnosis Description Q82.0 Hereditary lymphedema L97.811 Non-pressure chronic ulcer of other part of right lower leg limited to bre L97.821 Non-pressure chronic ulcer of other part of left lower leg limited to brea Modifier: akdown of skin kdown of skin Quantity: Electronic Signature(s) Signed: 07/15/2020 8:20:58 AM By: Elliot Gurney, BSN, RN, CWS, Kim RN, BSN Signed: 07/15/2020 4:17:09 PM By: Lenda Kelp PA-C Previous Signature: 07/14/2020 3:08:06 PM Version By: Lenda Kelp PA-C Entered By: Elliot Gurney, BSN, RN, CWS, Kim on 07/15/2020 08:20:57

## 2020-07-14 NOTE — Progress Notes (Addendum)
ARSHIYA, PENMAN ([id number]) Visit Report for 07/14/2020 Arrival Information Details Patient Name: [name], [name]. Date of Service: 07/14/2020 1:45 PM Medical Record Number: [id number] Patient Account Number: [id number] Date of Birth/Sex: 1956/01/10 (65 y.o. F) Treating RN: Cornell Barman Primary Care [place] Sigmund: Tandy Gaw Other Clinician: Referring Leilani Cespedes: Tandy Gaw Treating Chaeli Judy/Extender: Skipper Cliche in Treatment: 71 Visit Information History Since Last Visit Added or deleted any medications: No Patient Arrived: [name] Any new allergies or adverse reactions: No Arrival Time: 14:11 Had a fall or experienced change in No Accompanied By: self activities of daily living that may affect Transfer Assistance: None risk of falls: Patient Identification Verified: Yes Signs or symptoms of abuse/neglect since last visito No Secondary Verification Process Completed: Yes Hospitalized since last visit: No Patient Requires Transmission-Based Precautions: No Implantable device outside of the clinic excluding No Patient Has Alerts: No cellular tissue based products placed in the center since last visit: Has Dressing in Place as Prescribed: Yes Pain Present Now: No Electronic Signature(s) Signed: 07/14/2020 4:51:21 PM By: Lorine Bears RCP, RRT, CHT Entered By: Lorine Bears on 07/14/2020 14:15:56 Gombert, [name] ([id number]) -------------------------------------------------------------------------------- Clinic Level of Care Assessment Details Patient Name: [name]. Date of Service: 07/14/2020 1:45 PM Medical Record Number: [id number] Patient Account Number: [id number] Date of Birth/Sex: 1955/12/07 (65 y.o. F) Treating RN: Cornell Barman Primary Care Josehua Hammar: Tandy Gaw Other Clinician: Referring Dvid Pendry: Tandy Gaw Treating Margalit Leece/Extender: Skipper Cliche in Treatment: 32 Clinic Level of Care Assessment Items TOOL 4 Quantity  Score []  - Use when only an EandM is performed on FOLLOW-UP visit 0 ASSESSMENTS - Nursing Assessment / Reassessment X - Reassessment of Co-morbidities (includes updates in patient status) 1 10 X- 1 5 Reassessment of Adherence to Treatment Plan ASSESSMENTS - Wound and Skin Assessment / Reassessment []  - Simple Wound Assessment / Reassessment - one wound 0 []  - 0 Complex Wound Assessment / Reassessment - multiple wounds []  - 0 Dermatologic / Skin Assessment (not related to wound area) ASSESSMENTS - Focused Assessment X - Circumferential Edema Measurements - multi extremities 2 5 []  - 0 Nutritional Assessment / Counseling / Intervention X- 1 5 Lower Extremity Assessment (monofilament, tuning fork, pulses) []  - 0 Peripheral Arterial Disease Assessment (using hand held doppler) ASSESSMENTS - Ostomy and/or Continence Assessment and Care []  - Incontinence Assessment and Management 0 []  - 0 Ostomy Care Assessment and Management (repouching, etc.) PROCESS - Coordination of Care X - Simple Patient / Family Education for ongoing care 1 15 []  - 0 Complex (extensive) Patient / Family Education for ongoing care []  - 0 Staff obtains Programmer, systems, Records, Test Results / Process Orders []  - 0 Staff telephones HHA, Nursing Homes / Clarify orders / etc []  - 0 Routine Transfer to another Facility (non-emergent condition) []  - 0 Routine Hospital Admission (non-emergent condition) []  - 0 New Admissions / Biomedical engineer / Ordering NPWT, Apligraf, etc. []  - 0 Emergency Hospital Admission (emergent condition) X- 1 10 Simple Discharge Coordination []  - 0 Complex (extensive) Discharge Coordination PROCESS - Special Needs []  - Pediatric / Minor Patient Management 0 []  - 0 Isolation Patient Management []  - 0 Hearing / Language / Visual special needs []  - 0 Assessment of Community assistance (transportation, D/C planning, etc.) []  - 0 Additional assistance / Altered mentation []  -  0 Support Surface(s) Assessment (bed, cushion, seat, etc.) INTERVENTIONS - Wound Cleansing / Measurement Libman, Shatina C. ([id number]) []  - 0 Simple Wound Cleansing - one wound []  - 0  Complex Wound Cleansing - multiple wounds []  - 0 Wound Imaging (photographs - any number of wounds) []  - 0 Wound Tracing (instead of photographs) []  - 0 Simple Wound Measurement - one wound []  - 0 Complex Wound Measurement - multiple wounds INTERVENTIONS - Wound Dressings []  - Small Wound Dressing one or multiple wounds 0 []  - 0 Medium Wound Dressing one or multiple wounds X- 1 20 Large Wound Dressing one or multiple wounds []  - 0 Application of Medications - topical []  - 0 Application of Medications - injection INTERVENTIONS - Miscellaneous []  - External ear exam 0 []  - 0 Specimen Collection (cultures, biopsies, blood, body fluids, etc.) []  - 0 Specimen(s) / Culture(s) sent or taken to Lab for analysis []  - 0 Patient Transfer (multiple staff / / Similar devices) []  - 0 Simple Staple / Suture removal (25 or less) []  - 0 Complex Staple / Suture removal (26 or more) []  - 0 Hypo / Hyperglycemic Management (close monitor of Blood Glucose) []  - 0 Ankle / Brachial Index (ABI) - do not check if billed separately X- 1 5 Vital Signs Has the patient been seen at the hospital within the last three years: Yes Total Score: 80 Level Of Care: New/Established - Level 3 Electronic Signature(s) Signed: 07/15/2020 5:26:41 PM By: , BSN, RN, CWS, Kim RN, BSN Entered By: , BSN, RN, CWS, Kim on 07/15/2020 08:20:27 ( ) -------------------------------------------------------------------------------- Encounter Discharge Information Details Patient Name: [name], [name] Date of Service: 07/14/2020 1:45 PM Medical Record Number: Patient Account Number: Date of Birth/Sex: 08-27-1955 (65 y.o. F) Treating RN: Primary Care Tallen Schnorr:  Other Clinician: Referring Gunnar Hereford: Treating Freda Jaquith/Extender: in Treatment: 32 Encounter Discharge Information Items Discharge Condition: Stable Ambulatory Status: Ambulatory Discharge Destination: Home Transportation: Private Auto Accompanied By: self Schedule Follow-up Appointment: Yes Clinical Summary of Care: Electronic Signature(s) Signed: 07/15/2020 8:22:49 AM By: Elliot Gurney, BSN, RN, CWS, Kim RN, BSN Entered By: 09/12/2020, BSN, RN, CWS, Kim on 07/15/2020 08:22:49 [id number] (Ceasar Lund) -------------------------------------------------------------------------------- Lower Extremity Assessment Details Patient Name: MACIEL, KEGG C. Date of Service: 07/14/2020 1:45 PM Medical Record Number: [id number] Patient Account Number: 09/05/1955 Date of Birth/Sex: 10-26-1955 (65 y.o. F) Treating RN: Dala Dock Primary Care Eliyahu Bille: Dala Dock Other Clinician: Referring Koralyn Prestage: Rowan Blase Treating Tarrell Debes/Extender: 34 in Treatment: 32 Edema Assessment Assessed: [Left: No] [Right: No] [Left: Edema] [Right: :] Calf Left: Right: Point of Measurement: 32 cm From Medial Instep 83 cm 87.5 cm Ankle Left: Right: Point of Measurement: 9 cm From Medial Instep 50 cm 51.5 cm Vascular Assessment Pulses: Dorsalis Pedis Palpable: [Left:Yes] [Right:Yes] Electronic Signature(s) Signed: 07/15/2020 5:26:41 PM By: Elliot Gurney, BSN, RN, CWS, Kim RN, BSN Entered By: Elliot Gurney, BSN, RN, CWS, Kim on 07/14/2020 14:33:49 Kage, [name] ([id number]) -------------------------------------------------------------------------------- Multi Wound Chart Details Patient Name: Ceasar Lund. Date of Service: 07/14/2020 1:45 PM Medical Record Number: [id number] Patient Account Number: [id number] Date of Birth/Sex: [birth year] (65 y.o. F) Treating RN: Huel Coventry Primary Care Yaphet Smethurst: Dala Dock Other Clinician: Referring Malakhi Markwood: Dala Dock Treating Crimson Dubberly/Extender: Rowan Blase in Treatment: 32 Vital Signs Height(in): 63 Pulse(bpm): 65 Weight(lbs): 370 Blood Pressure(mmHg): 145/62 Body Mass Index(BMI): 66 Temperature(F): 97.4 Respiratory Rate(breaths/min): 18 Wound Assessments Treatment Notes Electronic Signature(s) Signed: 07/15/2020 8:17:35 AM By: Elliot Gurney, BSN, RN, CWS, Kim RN, BSN Entered By: Elliot Gurney, BSN, RN, CWS, Kim on 07/15/2020 08:17:34 Ronda Fairly ([id number]) -------------------------------------------------------------------------------- Multi-Disciplinary Care Plan Details Patient Name:  Skousen, Marieclaire C. Date of Service: 07/14/2020 1:45 PM Medical Record Number: [id number] Patient Account Number: [id number] Date of Birth/Sex: [birth year] (65 y.o. F) Treating RN: Cornell Barman Primary Care Serafino Burciaga: Tandy Gaw Other Clinician: Referring Paulett Kaufhold: Tandy Gaw Treating Beyonca Wisz/Extender: Skipper Cliche in Treatment: 57 Active Inactive Abuse / Safety / Falls / Self Care Management Nursing Diagnoses: Potential for falls Goals: Patient will remain injury free related to falls Date Initiated: 11/27/2019 Target Resolution Date: 02/13/2020 Goal Status: Active Interventions: Assess fall risk on admission and as needed Notes: Orientation to the Wound Care Program Nursing Diagnoses: Knowledge deficit related to the wound healing center program Goals: Patient/caregiver will verbalize understanding of the [place] [facility] Program Date Initiated: 11/27/2019 Target Resolution Date: 02/13/2020 Goal Status: Active Interventions: Provide education on orientation to the wound center Notes: Venous Leg Ulcer Nursing Diagnoses: Potential for venous Insuffiency (use before diagnosis confirmed) Goals: Patient will maintain optimal edema control Date Initiated: 11/27/2019 Target Resolution Date: 02/13/2020 Goal Status: Active Interventions: Compression as ordered Notes: Electronic  Signature(s) Signed: 07/15/2020 8:17:27 AM By: Gretta Cool, BSN, RN, CWS, Kim RN, BSN Entered By: Gretta Cool, BSN, RN, CWS, Kim on 07/15/2020 08:17:27 Minerva, [name] ([id number]) -------------------------------------------------------------------------------- Non-Wound Condition Assessment Details Patient Name: [name] Date of Service: 07/14/2020 1:45 PM Medical Record Number: [id number] Patient Account Number: [id number] Date of Birth/Sex: [birth year] (65 y.o. F) Treating RN: Cornell Barman Primary Care Louvenia Golomb: Tandy Gaw Other Clinician: Referring Ethyle Tiedt: Tandy Gaw Treating Naiya Corral/Extender: Skipper Cliche in Treatment: 32 Non-Wound Condition: Condition: Lymphedema Location: Leg Side: Right Notes: Right leg with decreased drainage this visit. Notes Patient legs are weeping fluid. Patient states she has been using [employer] on her legs and she feels it is helping with the drainage. Electronic Signature(s) Signed: 07/15/2020 5:26:41 PM By: Gretta Cool, BSN, RN, CWS, Kim RN, BSN Entered By: Gretta Cool, BSN, RN, CWS, Kim on 07/14/2020 14:36:30 Cody, [name] ([id number]) -------------------------------------------------------------------------------- Non-Wound Condition Assessment Details Patient Name: ABBIEGALE, ALARID. Date of Service: 07/14/2020 1:45 PM Medical Record Number: [id number] Patient Account Number: [id number] Date of Birth/Sex: Feb 09, 1956 (65 y.o. F) Treating RN: Cornell Barman Primary Care Kamaiya Antilla: Tandy Gaw Other Clinician: Referring Johnathyn Viscomi: Tandy Gaw Treating Nicklas Mcsweeney/Extender: Skipper Cliche in Treatment: 32 Non-Wound Condition: Condition: Lymphedema Location: Leg Side: Left Notes: decreased drainage this visit. Notes Patient legs are weeping fluid. Patient states she has been using [employer] on her legs and she feels it is helping with the drainage. Electronic Signature(s) Signed: 07/15/2020 5:26:41 PM By: Gretta Cool, BSN, RN, CWS, Kim RN,  BSN Entered By: Gretta Cool, BSN, RN, CWS, Kim on 07/14/2020 14:36:47 Hackleman, [name] ([id number]) -------------------------------------------------------------------------------- Pain Assessment Details Patient Name: TUWANA, LOFTHOUSE. Date of Service: 07/14/2020 1:45 PM Medical Record Number: [id number] Patient Account Number: [id number] Date of Birth/Sex: 1955-11-26 (65 y.o. F) Treating RN: Cornell Barman Primary Care Artrice Kraker: Tandy Gaw Other Clinician: Referring Elisabel Hanover: Tandy Gaw Treating Xxavier Noon/Extender: Skipper Cliche in Treatment: 66 Active Problems Location of Pain Severity and Description of Pain Patient Has Paino Yes Site Locations Pain Location: Generalized Pain Rate the pain. Current Pain Level: 3 Pain Management and Medication Current Pain Management: Notes Patient denies pain at this time except in knees. Electronic Signature(s) Signed: 07/15/2020 5:26:41 PM By: Gretta Cool, BSN, RN, CWS, Kim RN, BSN Entered By: Gretta Cool, BSN, RN, CWS, Kim on 07/14/2020 14:30:50 [name] ([id number]) -------------------------------------------------------------------------------- Patient/Caregiver Education Details Patient Name: [name] Date of Service: 07/14/2020 1:45 PM Medical Record Number: [id number] Patient Account Number: [id number]  Date of Birth/Gender: 22-Feb-1956 (65 y.o. F) Treating RN: Cornell Barman Primary Care Physician: Tandy Gaw Other Clinician: Referring Physician: Tandy Gaw Treating Physician/Extender: Skipper Cliche in Treatment: 16 Education Assessment Education Provided To: Patient Education Topics Provided Wound/Skin Impairment: Handouts: Other: Continue wound care as prescribed Electronic Signature(s) Signed: 07/15/2020 5:26:41 PM By: Gretta Cool, BSN, RN, CWS, Kim RN, BSN Entered By: Gretta Cool, BSN, RN, CWS, Kim on 07/15/2020 08:21:26 [name]  ([id number]) -------------------------------------------------------------------------------- Vitals Details Patient Name: [name] Date of Service: 07/14/2020 1:45 PM Medical Record Number: [id number] Patient Account Number: [id number] Date of Birth/Sex: [birth year] (65 y.o. F) Treating RN: Cornell Barman Primary Care Favor Kreh: Tandy Gaw Other Clinician: Referring Johnathan Tortorelli: Tandy Gaw Treating Merek Niu/Extender: Skipper Cliche in Treatment: 32 Vital Signs Time Taken: 14:15 Temperature (F): 97.4 Height (in): 63 Pulse (bpm): 65 Weight (lbs): 370 Respiratory Rate (breaths/min): 18 Body Mass Index (BMI): 65.5 Blood Pressure (mmHg): 145/62 Reference Range: 80 - 120 mg / dl Electronic Signature(s) Signed: 07/14/2020 4:51:21 PM By: Lorine Bears RCP, RRT, CHT Entered By: Lorine Bears on 07/14/2020 14:21:00

## 2020-07-28 ENCOUNTER — Ambulatory Visit: Payer: Medicaid Other | Admitting: Physician Assistant

## 2020-08-04 ENCOUNTER — Other Ambulatory Visit: Payer: Self-pay

## 2020-08-04 ENCOUNTER — Encounter: Payer: Medicaid Other | Admitting: Physician Assistant

## 2020-08-04 DIAGNOSIS — I890 Lymphedema, not elsewhere classified: Secondary | ICD-10-CM | POA: Diagnosis not present

## 2020-08-04 NOTE — Progress Notes (Addendum)
ALYSS, GRANATO ([id number]) Visit Report for 08/04/2020 Chief Complaint Document Details Patient Name: [name], [name]. Date of Service: 08/04/2020 1:30 PM Medical Record Number: [id number] Patient Account Number: [id number] Date of Birth/Sex: [birth year] (65 y.o. F) Treating RN: Huel Coventry Primary Care Provider: Dala Dock Other Clinician: Referring Provider: Dala Dock Treating Provider/Extender: Rowan Blase in Treatment: 35 Information Obtained from: Patient Chief Complaint Bilateral LE lymphedema Electronic Signature(s) Signed: 08/04/2020 1:54:51 PM By: Lenda Kelp PA-C Entered By: Lenda Kelp on 08/04/2020 13:54:51 [place], Ronda Fairly ([id number]) -------------------------------------------------------------------------------- HPI Details Patient Name: [name] Date of Service: 08/04/2020 1:30 PM Medical Record Number: [id number] Patient Account Number: [id number] Date of Birth/Sex: 24-Jan-1956 (65 y.o. F) Treating RN: Huel Coventry Primary Care Provider: Dala Dock Other Clinician: Referring Provider: Dala Dock Treating Provider/Extender: Rowan Blase in Treatment: 35 History of Present Illness HPI Description: The patient is a 65 year old female with history of hypertension and a long-standing history of bilateral lower extremity lymphedema (first presented on 4/2) . She has had open ulcers in the past which have always responded to compression therapy. She had briefly been to a lymphedema clinic in the past which helped her at the time. this time around she stopped treatment of her lymphedema pumps approximately 2 weeks ago because of some pain in the knees and then noticed the right leg getting worse. She was seen by her PCP who put her on clindamycin 4 times a day 2 days ago. The patient has seen [facility] and Dr. Gilda Crease had seen her last year where a vascular study including venous and arterial duplex studies were within normal limits. he had  recommended compression stockings and lymphedema pumps and the patient has been using this in about 2 weeks ago. She is known to be diabetic but in the past few time she's gone to her primary care doctor her hemoglobin A1c has been normal. 02/11/2015 - after her last visit she took my advice and went to the ER regarding the progressive cellulitis of her right lower extremity and she was admitted between July 17 and 22nd. She received IV antibiotics and then was sent home on a course of steroid-induced and oral antibiotics. She has improved much since then. 02/17/2015 -- she has been doing fine and the weeping of her legs has remarkably gone down. She has no fresh issues. READMISSION 01/15/18 This patient was given this clinic before most recently in 2016 seen by Dr. Meyer Russel. She has massive bilateral lymphedema and over the last 2 months this had weeping edema out of the left leg. She has compression pumps but her compliance with these has been minimal. She has [facility] they've been using TCA/ABDs/kerlix under an Ace wrap.she has had recent problems with cellulitis. She was apparently seen in the ER and 12/23/17 and given clindamycin. She was then followed by her primary doctor and given doxycycline and Keflex. The pain seems to have settled down. In April 2018 the patient had arterial studies done at  [facility]. This showed triphasic waveforms throughout the right leg and mostly triphasic waveforms on the left except for monophasic at the posterior tibial artery distally. She was not felt to have evidence of right lower extremity arterial stenosis or significant problems on the left side. 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 She also had a right lower extremity venous Doppler in January 2018 this was limited by the patient's body habitus and lymphedema. Most of the proximal veins  were not visualized The patient presents with an area of denuded  skin on the anterior medial part of the left calf. There is weeping edema fluid here. 01/22/18; the patient has somewhat better edema control using her compression pumps twice a day and as a result she has much better epithelialization on the left anterior calf area. Only a small open area remains. 01/29/18; the patient has been compliant with her compression pumps. Both the areas on her calf that healed. The remaining area on the left anterior leg is fully epithelialized Readmission: 02/20/2019 upon evaluation today patient presents for reevaluation due to issues that she is having with the bilateral lower extremities. She actually has wounds open on both legs. On the right she has an area in the crease of her leg on the right around the knee region which is actually draining quite a bit and actually has some fungal type appearance to it. She has been on nystatin powder that seems to have helped to some degree. In regard to the left lower extremity this is actually in the lower portion of her leg closer to the ankle and again is continuing to drain as well unfortunately. There does not appear to be any signs of active infection at this time which is good news. No fevers, chills, nausea, vomiting, or diarrhea. She tells me that since she was seen last year she is actually been doing quite well for the most part with regard to her lower extremities. Unfortunately she now is experiencing a little bit more drainage at this time. She is concerned about getting this under control so that it does not get significantly worse. 02/27/2019 on evaluation today patient appears to be doing somewhat better in regard to her bilateral lower extremity wounds. She has been tolerating the dressing changes without complication. Fortunately there is no signs of active infection at this point. No fevers, chills, nausea, vomiting, or diarrhea. She did get her dressing supplies which is excellent news she was extremely excited  to get these. She also got paperwork from [employer] for their financial assistance program where they may be able to help her out in the future if needed with supplies at discounted prices. 03/06/2019 on evaluation today patient appears to be doing a little worse with regard to both areas of weeping on her bilateral lower extremities. This is around the right medial knee and just above the left ankle. With that being said she is unfortunately not doing as well as I would like to see. I feel like she may need to potentially go see someone at the lymphedema clinic as the wraps that she needs or even beyond what we can do here at the wound care center. She really does not have wounds she just has open areas of weeping that are causing some difficulty for her. Subsequently because of this and the moisture I am concerned about the potential for infection I am going to likely give her a prophylactic antibiotic today, Keflex, just to be on the safe side. Nonetheless again there is no obvious signs of active infection at this time. 03/13/2019 on evaluation today patient appears to be doing well with regard to her bilateral lower extremities where she has been weeping compared to even last week's evaluation. I see some areas of new skin growth which is excellent and overall I am very pleased with how things seem to be progressing. No fevers, chills, nausea, vomiting, or diarrhea. SHAVAUGHN, SEIDL ([id number]) 03/20/2019 on evaluation today patient  unfortunately is continuing to have issues with significant edema of the left lower extremity. Her right side seems to be doing much better. Unfortunately her left side is showing increased weeping of the lower portion of her leg. This is quite unfortunate obviously we were hoping to get her into the lymphedema clinic they really do not seem to when I see her how if she is draining. Despite the fact this is really not wound related but more lymphedema weeping related.  Nonetheless I do not know that this can be helpful for her to even go for that appointment since again I am not sure there is much that they would actually do at this point. We may need to try a 4 layer compression wrap as best we can on her leg. She is on the Augmentin currently although I am still concerned about whether or not there could be potentially something going on infection wise I would obtain a culture though I understand is not the best being that is a surface culture I just 1 to make sure I do not seem to be missing anything. 03/27/2019 on evaluation today patient appears to be doing much better in regard to the left lower extremity compared to last week. Last week she had tremendous weeping which I think was subsequent to infection now she seems to be doing much better and very pleased. This is not completely healed but there is a lot of new skin growth and it has dried out quite a bit. Overall I think that we are doing well with how things are moving along at this time. No fevers, chills, nausea, vomiting, or diarrhea. 04/03/2019 evaluation today patient appears to be doing a little worse this week compared to last time I saw her. I think this may be due to the fact that she is having issues with not being able to sleep in her bed at least not until last night. She is therefore been in a lift chair and subsequently has also had issues with not been able to use her pumps since she could not get in bed. With that being said the patient overall seems to be doing okay I do think I may want extend the antibiotic for a little bit longer at least until we can see if her edema and her weeping gets better and if it is then obviously I can always discontinue the antibiotics as of next week however I want her to continue to have it over the next week. 04/10/2019 on evaluation today patient unfortunately is still doing poorly with regard to her left lower extremity. Her right is all things  considering doing fairly well. On the left however she continues to have spreading of the area of infection and weeping which appears to be even a larger surface area than noted last week. She did have a positive culture for Pseudomonas in particular which seems to have been of concern she still has green/yellow discharge consistent with Pseudomonas and subsequently a tremendous amount of it. This has me obviously still concerned about the infection not really clearing up despite the fact that on culture it appears the Cipro should have been a good option for treating this. I think she may at this point need IV antibiotics since things are not doing better I do not want to get worse and cause sepsis. She is in agreement with the plan and believes as well that she likely does need to go to the hospital for IV vancomycin.  Or something of the like depending on what the recommendation is from the ER. 04/17/2019 on evaluation today patient appears to be doing excellent in regard to her lower extremity on the left. She was in the hospital for several days from when I sent her last we saw her until just this past Tuesday. Fortunately her drainage is significantly improved and in fact is mostly clear. There is just a couple small areas that may still drain a little bit she states that the [facility] they prescribed for her at discharge she went picked up from pharmacy and got home but has not been able to find it since. She is looked everywhere. She is wondering if I will replace that for her today I will be more than happy to do that. 05/01/2019 on evaluation today patient actually appears to be doing quite well with regard to her lower extremities. She occasionally is having areas that will leak and then heal up mainly when a piece of the fibrotic skin pops off but fortunately she is not having any signs of active infection at this time. Overall she also really does not have any obvious weeping at this time. I  do believe however she really needs some compression wraps and I think this may be a good time to get her back to the lymphedema clinic. 05/11/2019 on evaluation today patient actually appears to be doing quite well with regard to her bilateral lower extremities. She occasionally will have a small area that we per another but in general seems to be completely healed which is great news. Overall very pleased with how everything seems to be progressing. She does have her appointment with lymphedema clinic on November 18. 05/25/2019 on evaluation today patient appears to be doing well with regard to her left lower extremity. I am very pleased in this regard. In regard to her right leg this actually did start draining more I think it is mainly due to the fact that her leg is more swollen. I am not seeing any obvious signs of infection at this time although that is definitely something were obviously acutely aware of simply due to the fact that she had an issue not too far back with exactly this issue. Nonetheless I do feel like that lymphedema clinic would still be beneficial for her. I explained obviously if they are not able to do anything treatment wise on the right leg we could at least have them treat her left leg and then proceed from there. The patient is really in agreement with that plan. If they are able to do both as the drainage slows down that I would be happy to let them handle both. 06/01/2019 on evaluation today patient unfortunately appears to be doing worse with regard to her right lower extremity. The left lower extremity is still maintaining at this point. Unfortunately she has been having significantly increased pain over the past several days and has been experiencing as well increased swelling of the right lower extremity. I really do not know that I am seeing anything that appears to be obvious for infection at this point to be peripherally honest. With that being said the patient  does seem to be having much more swelling that she is even experienced in the past and coupled with increased pain in her hip as well I am concerned that again she could potentially have a DVT although I am not 100% sure of this. I think it something that may need to be checked  out. We discussed the possibility of sending her for a DVT study through the hospital but unfortunately transportation is an issue if she does have a DVT I do not want her to wait days to be able to get in for that test however if she has this scheduled as an outpatient that is as fast that she will be able to get the test scheduled for transportation purposes. That will also fall on Thanksgiving so subsequently she did actually be looking at either Friday or even next week before we would know anything back from this. That is much too long in my opinion. Subsequent to the amount of discomfort she is experiencing the patient is actually okay with going to the ER for evaluation today. 06/12/2019 on evaluation today patient actually appears to be doing significantly better compared to last time I saw her. Following when I last saw her she was actually in the hospital from that Monday until the following Sunday almost 1 full week. She actually was placed on Keflex in the hospital following the time for her to be discharged and Dr. Joylene Draft has recommended 2 times a day dosing of the Keflex for the next year in order to help with more prophylactic/preventative measures with regard to her developing cellulitis. Overall I think this sounds like an excellent plan. The patient unfortunately is good to have trouble being treated at lymphedema clinic due to the fact that she really cannot get up on the bed that they have there. They also state that they cannot manage her as long as she has anything draining at this point. Obviously that is somewhat unfortunate as she does need help with edema control but nonetheless we will have to do  what we can for her outside of it sounds like the lymphedema clinic scenario at this point. 06/19/2019 on evaluation today patient appears to be doing fairly well with regard to her bilateral lower extremities. She is not nearly as swollen and shows no signs of infection at this point. There is no evidence of cellulitis whatsoever. She also has no open wounds or draining at this point which is also good news. No fever chills noted. She seems to be in very good spirits and in fact appears to be doing quite well. READMISSION 11/27/2019 DEAZIA, LAMPI ([id number]) This is a 65 year old woman that we have had in this clinic several times before including 2015, 16 and 19 and then most recently from 03/20/2019 through 06/19/2019 with bilateral lower extremity lymphedema. She has had previous arterial and reflux studies done years ago which were not all that remarkable. 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 She does have a positive family history and she had large legs starting may be in her 47s. She was recently in hospital from 10/20/2019 through 10/28/2019 with right leg cellulitis. She was given Ancef and clindamycin and then Zosyn when a culture showed Pseudomonas. At that time there was purulent drainage. She was followed by infectious disease Dr Joylene Draft. The patient is now back at home. She has noted increased swelling in the right and no drainage in her right leg mostly on the posterior medial aspect in the calf area. She has not had pain or fever. She has literally been improved lysing above dressings because her at the area of this is far too large for standard compression. She has been wrapping the areas with sheets to resorptive pads. She is found these helped somewhat. She  does have an appointment with the lymphedema clinic in [place] in late June. Past medical history includes bilateral lymphedema, hypertension, obstructive  sleep apnea with CPAP. Recent hospitalization with apparently Pseudomonas cellulitis of the right lower leg 12/15/2019 upon evaluation today patient appears to be doing a little bit worse in regard to her right lower extremity. Unfortunately she is having more weeping down in the lower portion of her leg. Fortunately there is no signs of active infection at this time. No fever chills noted. The patient states she is not having increased pain except for when she attempted to use the lymphedema pumps unfortunately she states that she did have pain when she did this. Otherwise we been using absorptive dressings of one type or another she is using diapers at home and then subsequently Ace wraps. In regard to the barrier cream we have discussed the possibility of derma cloud which she would like to try I do not have a problem with that. 12/22/2019 upon evaluation today patient actually appears to be doing better in regard to her leg ulcers at this point. Fortunately there does not appear to be any signs of active infection which is great news and I am extremely pleased with where things are progressing at this time. There is no sign of active infection currently. The patient is very pleased to see things doing so well. 12/29/2019 upon evaluation today patient appears to be doing a little bit better in regard to her weeping in general over her lower extremities. She does have some signs of mild erythema little bit more than what I noted last week or rather last visit. Nonetheless I think that my threshold for switching her antibiotics from Keflex to something else is very low at this point considering that she has had such severe infections in the past that seem to come almost out of nowhere. There is a little erythema and warmth noted of the lower portion of her leg compared to the upper which also makes me want to go ahead and address things more rapidly at this point. Likely I would switch out the Keflex for  something like Levaquin ideally. 7/16; patient with severe bilateral lymphedema. She has superficial wounds albeit almost circumferential now on the left lateral lower leg. This may be new from last time. Small area on the right anterior lower leg and then another area on the right medial lower leg and of pannus fold. She has been using various absorptive garments. She states she is using her compression pumps once a day occasionally twice. Culture from her last visit here was negative 01/29/2020 on evaluation today patient appears to be doing excellent at this point in regard to her legs with regard to infection I see no signs of active infection at this point. She still does have unfortunately areas of weeping this is minimal on the right now her left is actually significantly worse although I do not think it is as bad as last week with Dr. Dellia Nims saw her. She has been trying to pump and elevate her legs is much as possible. She has previously been on the Keflex and in the past for prevention that seems to do fairly well and likely can extend that today. 02/04/2020 on evaluation today patient appears to be doing better in regard to her legs bilaterally. Fortunately there is no signs of active infection at this time which is great news and overall she has less weeping on the left compared to the right and  there is several spots where she is pretty much sealed up with no draining regions. Overall very pleased in this regard. 02/19/2020 on evaluation today patient appears to be doing very well in regard to her wounds currently. Fortunately there is no evidence of active infection overall very pleased with where things stand. She is significantly improved in regard to her edema I am extremely pleased in this regard she tells me that the popping no longer hurts and in fact she actually looks forward to it. 03/04/2020 on evaluation today patient appears to be doing excellent in regard to her lower  extremities. Fortunately there is no signs of active infection at this time. No fevers, chills, nausea, vomiting, or diarrhea. 03/25/2020 on evaluation today patient appears to be doing a little bit more poorly in regard to her legs at this point. She tells me that she is still continue to have issues with drainage and this has been a little bit worse she was getting ready to start taking the Keflex again but wanted to see me first. Fortunately there is no signs of active infection at this time. No fevers, chills, nausea, vomiting, or diarrhea. 04/15/2020 upon evaluation today patient appears to be doing somewhat poorly in regard to her right leg. She tells me she has been having more pain she has been taking the Keflex that was previously prescribed unfortunately that just does not seem to help with this. She was hoping that the pain on her right was actually coming from the fact that she was having issues with her wrap having gotten caught in her recliner. With that being said she tells me that she knew something was not right. Currently her right leg is warm to touch along with being erythematous all the way up to around at least mid thigh as far as I can see. The left leg does not appear to be doing that badly though there is increased weeping around the ankle region. 05/06/2020 on evaluation today patient appears to be doing much better than last time I saw her. She did go to the hospital where she was admitted for 2 days and treated with antibiotic therapy. She was discharged with antibiotics as well and has done extremely well. I am extremely pleased with where things stand today. There is no signs of active infection at this time which is great news. 05/27/2020 upon evaluation today patient appears to be doing well with regard to her lower extremities bilaterally. She has just a very tiny area on the right leg which is opening on the left leg she is significantly improved though she still has  several areas that do appear to be open this is minimal compared to what is been in the past. In general I am extremely pleased with where things stand today. The patient does tell me she is not been using her pumps quite as much as she should be. I do believe that is 1 area she can definitely work on. She has had a lot going on including a Covid exposure and apparently also a outbreak of likely shingles. 06/24/2020 upon evaluation today patient appears to be doing well with regard to her legs in general although the left leg unfortunately is showing some signs of erythema she does have a little bit of increased weeping and to be honest I am concerned about infection here. I discussed that with her today and I think that we may need to address this sooner rather than later she has been  taking Keflex she is not really certain that is been making a big improvement however. No fevers, chills, nausea, vomiting, or diarrhea. MELIYA, MCCONAHY ([id number]) 06/30/2020 upon evaluation today patient's legs actually seem to be doing better in my opinion as compared to where they were last week. Fortunately there does not appear to be any signs of active infection. Her culture showed multiple organisms nothing predominate. With that being said the Levaquin seems to have done well I think she has improved since I last saw her as well. 07/14/2020 upon evaluation today patient appears to be doing actually better in regard to her lower extremities in my opinion. She has been tolerating the dressing changes without complication. Fortunately there is no signs of active infection at this time. 08/04/2020 upon evaluation today patient appears to be doing excellent in regard to her leg ulcers. Fortunately she has very little that is open at this point. This is great news. In fact I think that the Goldbond medicated powder has been excellent for her. It seems to have done the trick where we had tried several other things  without as much success. Fortunately there is no evidence of active infection at this time. No fevers, chills, nausea, vomiting, or diarrhea. Electronic Signature(s) Signed: 08/04/2020 3:51:56 PM By: Worthy Keeler PA-C Entered By: Worthy Keeler on 08/04/2020 15:51:56 Fandino, [name] ([id number]) -------------------------------------------------------------------------------- Physical Exam Details Patient Name: KAWANA, HEGEL C. Date of Service: 08/04/2020 1:30 PM Medical Record Number: [id number] Patient Account Number: [id number] Date of Birth/Sex: 03/14/1956 (65 y.o. F) Treating RN: Cornell Barman Primary Care Provider: Tandy Gaw Other Clinician: Referring Provider: Tandy Gaw Treating Provider/Extender: Skipper Cliche in Treatment: 42 Constitutional Obese and well-hydrated in no acute distress. Respiratory normal breathing without difficulty. Psychiatric this patient is able to make decisions and demonstrates good insight into disease process. Alert and Oriented x 3. pleasant and cooperative. Notes Patient does seem to have good granulation epithelization there does not appear to be any evidence of open wounds significantly over the lower extremities she has a couple open areas but these are very minimal compared to what was seen previous. Overall I am extremely pleased with where things stand today. Electronic Signature(s) Signed: 08/04/2020 3:52:14 PM By: Worthy Keeler PA-C Entered By: Worthy Keeler on 08/04/2020 15:52:14 Brant, [name] ([id number]) -------------------------------------------------------------------------------- Physician Orders Details Patient Name: [name] Date of Service: 08/04/2020 1:30 PM Medical Record Number: [id number] Patient Account Number: [id number] Date of Birth/Sex: 03-02-1956 (65 y.o. 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 o Compression Pump: Use compression pump on left lower extremity for 60 minutes, twice daily. o Compression Pump: Use compression pump on right lower extremity for 60 minutes, twice daily. o DO YOUR BEST to sleep in the  bed at night. DO NOT sleep in your recliner. Long hours of sitting in a recliner leads to swelling of the legs and/or potential wounds on your backside. Additional Orders / Instructions o Other: - Blima Singer Powder Electronic Signature(s) Signed: 08/04/2020 5:24:05 PM By: Lenda Kelp PA-C Signed: 08/05/2020 5:21:46 PM By: Elliot Gurney, BSN, RN, CWS, Kim RN, BSN Entered By: Elliot Gurney, BSN, RN, CWS, Kim on 08/04/2020 14:31:12 Prien, Ronda Fairly ([id number]) -------------------------------------------------------------------------------- Problem List Details Patient Name: JENNIEFER, SALAK. Date of Service: 08/04/2020 1:30 PM Medical Record Number: [id number] Patient Account Number: [id number] Date of Birth/Sex: 1956/01/07 (65 y.o. F) Treating RN: Huel Coventry Primary Care Provider: Dala Dock Other Clinician: Referring Provider: Dala Dock Treating Provider/Extender: Rowan Blase in Treatment: 80 Active  Problems ICD-10 Encounter Code Description Active Date MDM Diagnosis Q82.0 Hereditary lymphedema 11/27/2019 No Yes L97.811 Non-pressure chronic ulcer of other part of right lower leg limited to 11/27/2019 No Yes breakdown of skin L97.821 Non-pressure chronic ulcer of other part of left lower leg limited to 01/22/2020 No Yes breakdown of skin Inactive Problems Resolved Problems Electronic Signature(s) Signed: 08/04/2020 1:54:43 PM By: Lenda Kelp PA-C Entered By: Lenda Kelp on 08/04/2020 13:54:43 Philyaw, Ronda Fairly ([id number]) -------------------------------------------------------------------------------- Progress Note Details Patient Name: [name]. Date of Service: 08/04/2020 1:30 PM Medical Record Number: [id number] Patient Account Number: [id number] Date of Birth/Sex: [birth year] (65 y.o. F) Treating RN: Huel Coventry Primary Care Provider: Dala Dock Other Clinician: Referring Provider: Dala Dock Treating Provider/Extender: Rowan Blase in Treatment: 35 Subjective Chief Complaint Information obtained from Patient Bilateral LE lymphedema History of Present Illness (HPI) The patient is a 65 year old female with history of hypertension and a long-standing history of bilateral lower extremity lymphedema (first presented on 4/2) . She has had open ulcers in the past which have always responded to compression therapy. She had briefly been to a lymphedema clinic in the past which helped her at the time. this time around she stopped treatment of her lymphedema pumps approximately 2 weeks ago because of some pain in the knees and then noticed the right leg getting worse. She was seen by her PCP who put her on clindamycin 4 times a day 2 days ago. The patient has seen [facility] and Dr. Gilda Crease had seen her last year where a vascular study including venous and arterial duplex studies were within normal limits. he had recommended compression stockings and lymphedema pumps  and the patient has been using this in about 2 weeks ago. She is known to be diabetic but in the past few time she's gone to her primary care doctor her hemoglobin A1c has been normal. 02/11/2015 - after her last visit she took my advice and went to the ER regarding the progressive cellulitis of her right lower extremity and she was admitted between July 17 and 22nd. She received IV antibiotics and then was sent home on a course of steroid-induced and oral antibiotics. She has improved much since then. 02/17/2015 -- she has been doing fine and the weeping of her legs has remarkably gone down. She has no fresh issues. READMISSION 01/15/18 This patient was given this clinic before most recently in 2016 seen by Dr. Meyer Russel. She has massive bilateral lymphedema and over the last 2 months this had weeping edema out of the left leg. She has compression pumps but her compliance with these has been minimal. She has [facility] they've been using TCA/ABDs/kerlix under an Ace wrap.she has had recent problems with cellulitis. She  was apparently seen in the ER and 12/23/17 and given clindamycin. She was then followed by her primary doctor and given doxycycline and Keflex. The pain seems to have settled down. In April 2018 the patient had arterial studies done at [facility]. This showed triphasic waveforms throughout the right leg and mostly triphasic waveforms on the left except for monophasic at the posterior tibial artery distally. She was not felt to have evidence of right lower extremity arterial stenosis or significant problems on the left side. 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 She also had a right lower extremity venous Doppler in January 2018 this was limited by the patient's body habitus and lymphedema. Most of the proximal veins were not visualized The patient presents with an area of denuded skin on the anterior medial part of the left calf.  There is weeping edema fluid here. 01/22/18; the patient has somewhat better edema control using her compression pumps twice a day and as a result she has much better epithelialization on the left anterior calf area. Only a small open area remains. 01/29/18; the patient has been compliant with her compression pumps. Both the areas on her calf that healed. The remaining area on the left anterior leg is fully epithelialized Readmission: 02/20/2019 upon evaluation today patient presents for reevaluation due to issues that she is having with the bilateral lower extremities. She actually has wounds open on both legs. On the right she has an area in the crease of her leg on the right around the knee region which is actually draining quite a bit and actually has some fungal type appearance to it. She has been on nystatin powder that seems to have helped to some degree. In regard to the left lower extremity this is actually in the lower portion of her leg closer to the ankle and again is continuing to drain as well unfortunately. There does not appear to be any signs of active infection at this time which is good news. No fevers, chills, nausea, vomiting, or diarrhea. She tells me that since she was seen last year she is actually been doing quite well for the most part with regard to her lower extremities. Unfortunately she now is experiencing a little bit more drainage at this time. She is concerned about getting this under control so that it does not get significantly worse. 02/27/2019 on evaluation today patient appears to be doing somewhat better in regard to her bilateral lower extremity wounds. She has been tolerating the dressing changes without complication. Fortunately there is no signs of active infection at this point. No fevers, chills, nausea, vomiting, or diarrhea. She did get her dressing supplies which is excellent news she was extremely excited to get these. She also got paperwork from [employer]  for their financial assistance program where they may be able to help her out in the future if needed with supplies at discounted prices. 03/06/2019 on evaluation today patient appears to be doing a little worse with regard to both areas of weeping on her bilateral lower extremities. This is around the right medial knee and just above the left ankle. With that being said she is unfortunately not doing as well as I would like to see. I feel like she may need to potentially go see someone at the lymphedema clinic as the wraps that she needs or even beyond what we can do here at the wound care center. She really does not have wounds she just  has open areas of weeping that are causing some difficulty for her. Subsequently because of this and the moisture I am concerned about the potential for infection I am going to likely give her a prophylactic antibiotic today, Keflex, just to be on the safe side. Nonetheless again there is no obvious signs of active infection at this time. KAZIA, GRISANTI ([id number]) 03/13/2019 on evaluation today patient appears to be doing well with regard to her bilateral lower extremities where she has been weeping compared to even last week's evaluation. I see some areas of new skin growth which is excellent and overall I am very pleased with how things seem to be progressing. No fevers, chills, nausea, vomiting, or diarrhea. 03/20/2019 on evaluation today patient unfortunately is continuing to have issues with significant edema of the left lower extremity. Her right side seems to be doing much better. Unfortunately her left side is showing increased weeping of the lower portion of her leg. This is quite unfortunate obviously we were hoping to get her into the lymphedema clinic they really do not seem to when I see her how if she is draining. Despite the fact this is really not wound related but more lymphedema weeping related. Nonetheless I do not know that this can be helpful for  her to even go for that appointment since again I am not sure there is much that they would actually do at this point. We may need to try a 4 layer compression wrap as best we can on her leg. She is on the Augmentin currently although I am still concerned about whether or not there could be potentially something going on infection wise I would obtain a culture though I understand is not the best being that is a surface culture I just 1 to make sure I do not seem to be missing anything. 03/27/2019 on evaluation today patient appears to be doing much better in regard to the left lower extremity compared to last week. Last week she had tremendous weeping which I think was subsequent to infection now she seems to be doing much better and very pleased. This is not completely healed but there is a lot of new skin growth and it has dried out quite a bit. Overall I think that we are doing well with how things are moving along at this time. No fevers, chills, nausea, vomiting, or diarrhea. 04/03/2019 evaluation today patient appears to be doing a little worse this week compared to last time I saw her. I think this may be due to the fact that she is having issues with not being able to sleep in her bed at least not until last night. She is therefore been in a lift chair and subsequently has also had issues with not been able to use her pumps since she could not get in bed. With that being said the patient overall seems to be doing okay I do think I may want extend the antibiotic for a little bit longer at least until we can see if her edema and her weeping gets better and if it is then obviously I can always discontinue the antibiotics as of next week however I want her to continue to have it over the next week. 04/10/2019 on evaluation today patient unfortunately is still doing poorly with regard to her left lower extremity. Her right is all things considering doing fairly well. On the left however she continues  to have spreading of the area of infection and weeping  which appears to be even a larger surface area than noted last week. She did have a positive culture for Pseudomonas in particular which seems to have been of concern she still has green/yellow discharge consistent with Pseudomonas and subsequently a tremendous amount of it. This has me obviously still concerned about the infection not really clearing up despite the fact that on culture it appears the Cipro should have been a good option for treating this. I think she may at this point need IV antibiotics since things are not doing better I do not want to get worse and cause sepsis. She is in agreement with the plan and believes as well that she likely does need to go to the hospital for IV vancomycin. Or something of the like depending on what the recommendation is from the ER. 04/17/2019 on evaluation today patient appears to be doing excellent in regard to her lower extremity on the left. She was in the hospital for several days from when I sent her last we saw her until just this past Tuesday. Fortunately her drainage is significantly improved and in fact is mostly clear. There is just a couple small areas that may still drain a little bit she states that the [facility] they prescribed for her at discharge she went picked up from pharmacy and got home but has not been able to find it since. She is looked everywhere. She is wondering if I will replace that for her today I will be more than happy to do that. 05/01/2019 on evaluation today patient actually appears to be doing quite well with regard to her lower extremities. She occasionally is having areas that will leak and then heal up mainly when a piece of the fibrotic skin pops off but fortunately she is not having any signs of active infection at this time. Overall she also really does not have any obvious weeping at this time. I do believe however she really needs some compression wraps and I  think this may be a good time to get her back to the lymphedema clinic. 05/11/2019 on evaluation today patient actually appears to be doing quite well with regard to her bilateral lower extremities. She occasionally will have a small area that we per another but in general seems to be completely healed which is great news. Overall very pleased with how everything seems to be progressing. She does have her appointment with lymphedema clinic on November 18. 05/25/2019 on evaluation today patient appears to be doing well with regard to her left lower extremity. I am very pleased in this regard. In regard to her right leg this actually did start draining more I think it is mainly due to the fact that her leg is more swollen. I am not seeing any obvious signs of infection at this time although that is definitely something were obviously acutely aware of simply due to the fact that she had an issue not too far back with exactly this issue. Nonetheless I do feel like that lymphedema clinic would still be beneficial for her. I explained obviously if they are not able to do anything treatment wise on the right leg we could at least have them treat her left leg and then proceed from there. The patient is really in agreement with that plan. If they are able to do both as the drainage slows down that I would be happy to let them handle both. 06/01/2019 on evaluation today patient unfortunately appears to be doing worse with regard  to her right lower extremity. The left lower extremity is still maintaining at this point. Unfortunately she has been having significantly increased pain over the past several days and has been experiencing as well increased swelling of the right lower extremity. I really do not know that I am seeing anything that appears to be obvious for infection at this point to be peripherally honest. With that being said the patient does seem to be having much more swelling that she is  even experienced in the past and coupled with increased pain in her hip as well I am concerned that again she could potentially have a DVT although I am not 100% sure of this. I think it something that may need to be checked out. We discussed the possibility of sending her for a DVT study through the hospital but unfortunately transportation is an issue if she does have a DVT I do not want her to wait days to be able to get in for that test however if she has this scheduled as an outpatient that is as fast that she will be able to get the test scheduled for transportation purposes. That will also fall on Thanksgiving so subsequently she did actually be looking at either Friday or even next week before we would know anything back from this. That is much too long in my opinion. Subsequent to the amount of discomfort she is experiencing the patient is actually okay with going to the ER for evaluation today. 06/12/2019 on evaluation today patient actually appears to be doing significantly better compared to last time I saw her. Following when I last saw her she was actually in the hospital from that Monday until the following Sunday almost 1 full week. She actually was placed on Keflex in the hospital following the time for her to be discharged and Dr. Joylene Draft has recommended 2 times a day dosing of the Keflex for the next year in order to help with more prophylactic/preventative measures with regard to her developing cellulitis. Overall I think this sounds like an excellent plan. The patient unfortunately is good to have trouble being treated at lymphedema clinic due to the fact that she really cannot get up on the bed that they have there. They also state that they cannot manage her as long as she has anything draining at this point. Obviously that is somewhat unfortunate as she does need help with edema control but nonetheless we will have to do what we can for her outside of it sounds like  the lymphedema clinic scenario at this point. 06/19/2019 on evaluation today patient appears to be doing fairly well with regard to her bilateral lower extremities. She is not nearly as swollen Howells, Sacoya C. ([id number]) and shows no signs of infection at this point. There is no evidence of cellulitis whatsoever. She also has no open wounds or draining at this point which is also good news. No fever chills noted. She seems to be in very good spirits and in fact appears to be doing quite well. READMISSION 11/27/2019 This is a 65 year old woman that we have had in this clinic several times before including 2015, 16 and 19 and then most recently from 03/20/2019 through 06/19/2019 with bilateral lower extremity lymphedema. She has had previous arterial and reflux studies done years ago which were not all that remarkable. 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 She does have a positive family history and she had large legs  starting may be in her 59s. She was recently in hospital from 10/20/2019 through 10/28/2019 with right leg cellulitis. She was given Ancef and clindamycin and then Zosyn when a culture showed Pseudomonas. At that time there was purulent drainage. She was followed by infectious disease Dr Joylene Draft. The patient is now back at home. She has noted increased swelling in the right and no drainage in her right leg mostly on the posterior medial aspect in the calf area. She has not had pain or fever. She has literally been improved lysing above dressings because her at the area of this is far too large for standard compression. She has been wrapping the areas with sheets to resorptive pads. She is found these helped somewhat. She does have an appointment with the lymphedema clinic in [place] in late June. Past medical history includes bilateral lymphedema, hypertension, obstructive sleep apnea with CPAP. Recent hospitalization with  apparently Pseudomonas cellulitis of the right lower leg 12/15/2019 upon evaluation today patient appears to be doing a little bit worse in regard to her right lower extremity. Unfortunately she is having more weeping down in the lower portion of her leg. Fortunately there is no signs of active infection at this time. No fever chills noted. The patient states she is not having increased pain except for when she attempted to use the lymphedema pumps unfortunately she states that she did have pain when she did this. Otherwise we been using absorptive dressings of one type or another she is using diapers at home and then subsequently Ace wraps. In regard to the barrier cream we have discussed the possibility of derma cloud which she would like to try I do not have a problem with that. 12/22/2019 upon evaluation today patient actually appears to be doing better in regard to her leg ulcers at this point. Fortunately there does not appear to be any signs of active infection which is great news and I am extremely pleased with where things are progressing at this time. There is no sign of active infection currently. The patient is very pleased to see things doing so well. 12/29/2019 upon evaluation today patient appears to be doing a little bit better in regard to her weeping in general over her lower extremities. She does have some signs of mild erythema little bit more than what I noted last week or rather last visit. Nonetheless I think that my threshold for switching her antibiotics from Keflex to something else is very low at this point considering that she has had such severe infections in the past that seem to come almost out of nowhere. There is a little erythema and warmth noted of the lower portion of her leg compared to the upper which also makes me want to go ahead and address things more rapidly at this point. Likely I would switch out the Keflex for something like Levaquin ideally. 7/16; patient  with severe bilateral lymphedema. She has superficial wounds albeit almost circumferential now on the left lateral lower leg. This may be new from last time. Small area on the right anterior lower leg and then another area on the right medial lower leg and of pannus fold. She has been using various absorptive garments. She states she is using her compression pumps once a day occasionally twice. Culture from her last visit here was negative 01/29/2020 on evaluation today patient appears to be doing excellent at this point in regard to her legs with regard to infection I see no signs of  active infection at this point. She still does have unfortunately areas of weeping this is minimal on the right now her left is actually significantly worse although I do not think it is as bad as last week with Dr. Leanord Hawking saw her. She has been trying to pump and elevate her legs is much as possible. She has previously been on the Keflex and in the past for prevention that seems to do fairly well and likely can extend that today. 02/04/2020 on evaluation today patient appears to be doing better in regard to her legs bilaterally. Fortunately there is no signs of active infection at this time which is great news and overall she has less weeping on the left compared to the right and there is several spots where she is pretty much sealed up with no draining regions. Overall very pleased in this regard. 02/19/2020 on evaluation today patient appears to be doing very well in regard to her wounds currently. Fortunately there is no evidence of active infection overall very pleased with where things stand. She is significantly improved in regard to her edema I am extremely pleased in this regard she tells me that the popping no longer hurts and in fact she actually looks forward to it. 03/04/2020 on evaluation today patient appears to be doing excellent in regard to her lower extremities. Fortunately there is no signs of  active infection at this time. No fevers, chills, nausea, vomiting, or diarrhea. 03/25/2020 on evaluation today patient appears to be doing a little bit more poorly in regard to her legs at this point. She tells me that she is still continue to have issues with drainage and this has been a little bit worse she was getting ready to start taking the Keflex again but wanted to see me first. Fortunately there is no signs of active infection at this time. No fevers, chills, nausea, vomiting, or diarrhea. 04/15/2020 upon evaluation today patient appears to be doing somewhat poorly in regard to her right leg. She tells me she has been having more pain she has been taking the Keflex that was previously prescribed unfortunately that just does not seem to help with this. She was hoping that the pain on her right was actually coming from the fact that she was having issues with her wrap having gotten caught in her recliner. With that being said she tells me that she knew something was not right. Currently her right leg is warm to touch along with being erythematous all the way up to around at least mid thigh as far as I can see. The left leg does not appear to be doing that badly though there is increased weeping around the ankle region. 05/06/2020 on evaluation today patient appears to be doing much better than last time I saw her. She did go to the hospital where she was admitted for 2 days and treated with antibiotic therapy. She was discharged with antibiotics as well and has done extremely well. I am extremely pleased with where things stand today. There is no signs of active infection at this time which is great news. 05/27/2020 upon evaluation today patient appears to be doing well with regard to her lower extremities bilaterally. She has just a very tiny area on the right leg which is opening on the left leg she is significantly improved though she still has several areas that do appear to be open this  is minimal compared to what is been in the past. In general I am  extremely pleased with where things stand today. The patient does tell me she is not been using her pumps quite as much as she should be. I do believe that is 1 area she can definitely work on. She has had a lot going on including a Covid exposure and apparently also a outbreak of likely shingles. SHEMEIKA, STARZYK ([id number]) 06/24/2020 upon evaluation today patient appears to be doing well with regard to her legs in general although the left leg unfortunately is showing some signs of erythema she does have a little bit of increased weeping and to be honest I am concerned about infection here. I discussed that with her today and I think that we may need to address this sooner rather than later she has been taking Keflex she is not really certain that is been making a big improvement however. No fevers, chills, nausea, vomiting, or diarrhea. 06/30/2020 upon evaluation today patient's legs actually seem to be doing better in my opinion as compared to where they were last week. Fortunately there does not appear to be any signs of active infection. Her culture showed multiple organisms nothing predominate. With that being said the Levaquin seems to have done well I think she has improved since I last saw her as well. 07/14/2020 upon evaluation today patient appears to be doing actually better in regard to her lower extremities in my opinion. She has been tolerating the dressing changes without complication. Fortunately there is no signs of active infection at this time. 08/04/2020 upon evaluation today patient appears to be doing excellent in regard to her leg ulcers. Fortunately she has very little that is open at this point. This is great news. In fact I think that the Goldbond medicated powder has been excellent for her. It seems to have done the trick where we had tried several other things without as much success. Fortunately there is no  evidence of active infection at this time. No fevers, chills, nausea, vomiting, or diarrhea. Objective Constitutional Obese and well-hydrated in no acute distress. Vitals Time Taken: 1:46 PM, Height: 63 in, Weight: 370 lbs, BMI: 65.5, Temperature: 98.3 F, Pulse: 58 bpm, Respiratory Rate: 18 breaths/min, Blood Pressure: 167/95 mmHg. Respiratory normal breathing without difficulty. Psychiatric this patient is able to make decisions and demonstrates good insight into disease process. Alert and Oriented x 3. pleasant and cooperative. General Notes: Patient does seem to have good granulation epithelization there does not appear to be any evidence of open wounds significantly over the lower extremities she has a couple open areas but these are very minimal compared to what was seen previous. Overall I am extremely pleased with where things stand today. 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 Compression Pump: Use compression pump on left lower extremity for 60 minutes, twice daily. Compression Pump: Use compression pump on right lower extremity for 60 minutes, twice daily. DO YOUR BEST to sleep in the bed at night. DO NOT sleep in your recliner. Long hours of sitting in a recliner  leads to swelling of the legs and/or potential wounds on your backside. DONNIKA, PADMANABHAN ([id number]) Additional Orders / Instructions: Other: - Gold Bond  Powder 1. I would recommend that we go and continue with the wound care measures as before and patient is in agreement with the plan. This includes the use of a Goldbond powder over the legs to try to keep things dry which has done excellent for her. She is then using Tucks pads to wrap around to catch any drainage which has also kept it nice and dry and done extremely well for her. 2. She should continue to try to sleep in the bed is much as possible she is attempting to do that. 3. She also needs to continue to use her lymphedema pumps 2 times a day. That does seem to help her significantly. We will see patient back for reevaluation in 1 week here in the clinic. If anything worsens or changes patient will contact our office for additional recommendations. Electronic Signature(s) Signed: 08/04/2020 3:53:07 PM By: Lenda Kelp PA-C Entered By: Lenda Kelp on 08/04/2020 15:53:06 Canning, Ronda Fairly ([id number]) -------------------------------------------------------------------------------- SuperBill Details Patient Name: [name] Date of Service: 08/04/2020 Medical Record Number: [id number] Patient Account Number: [id number] Date of Birth/Sex: Aug 16, 1955 (65 y.o. F) Treating RN: Huel Coventry Primary Care Provider: Dala Dock Other Clinician: Referring Provider: Dala Dock Treating Provider/Extender: Rowan Blase in Treatment: 35 Diagnosis Coding ICD-10 Codes Code Description Q82.0 Hereditary lymphedema L97.811 Non-pressure chronic ulcer of other part of right lower leg limited to breakdown of skin L97.821 Non-pressure chronic ulcer of other part of left lower leg limited to breakdown of skin Facility Procedures CPT4 Code: 60737106 Description: 99213 - WOUND CARE VISIT-LEV 3 EST PT Modifier: Quantity: 1 Physician Procedures CPT4 Code: 2694854 Description: 99213 - WC PHYS LEVEL 3 - EST PT Modifier: Quantity: 1 CPT4 Code: Description: ICD-10 Diagnosis  Description Q82.0 Hereditary lymphedema L97.811 Non-pressure chronic ulcer of other part of right lower leg limited to bre L97.821 Non-pressure chronic ulcer of other part of left lower leg limited to brea Modifier: akdown of skin kdown of skin Quantity: Electronic Signature(s) Signed: 08/04/2020 3:53:24 PM By: Lenda Kelp PA-C Entered By: Lenda Kelp on 08/04/2020 15:53:23

## 2020-08-05 NOTE — Progress Notes (Signed)
[name] ([id number]) Visit Report for 08/04/2020 Arrival Information Details Patient Name: Elizabeth, [name]. Date of Service: 08/04/2020 1:30 PM Medical Record Number: [id number] Patient Account Number: [id number] Date of Birth/Sex: 05-12-1956 (64 y.o. F) Treating [name]: [name] Primary Care Jozsef Wescoat: [name] Other Clinician: Referring [name]: [name] Treating [name]: [name]: 79 Visit Information History Since Last Visit Any new allergies or adverse reactions: No Patient Arrived: [name] Had a fall or experienced change in No Arrival Time: 13:45 activities of daily living that may affect Accompanied By: self risk of falls: Transfer Assistance: None Hospitalized since last visit: No Patient Requires Transmission-Based Precautions: No Has Dressing in Place as Prescribed: Yes Patient Has Alerts: No Pain Present Now: No Electronic Signature(s) Signed: 08/04/2020 5:27:46 PM By: [name] Entered By: [name] on 08/04/2020 13:46:19 [name] ([id number]) -------------------------------------------------------------------------------- Clinic Level of Care Assessment Details Patient Name: [name]. Date of Service: 08/04/2020 1:30 PM Medical Record Number: [id number] Patient Account Number: [id number] Date of Birth/Sex: 01-May-1956 (64 y.o. F) Treating [name]: [name] Primary Care [name]: [name] Other Clinician: Referring Mathea Frieling: [name] Treating Zaryan Yakubov/Extender: [name]: 35 Clinic Level of Care Assessment Items TOOL 4 Quantity Score []  - Use when only an EandM is performed on FOLLOW-UP visit 0 ASSESSMENTS - Nursing Assessment / Reassessment X - Reassessment of Co-morbidities (includes updates in patient status) 1 10 X- 1 5 Reassessment of Adherence to [name] Plan ASSESSMENTS - Wound and Skin Assessment / Reassessment X - Simple Wound  Assessment / Reassessment - one wound 1 5 []  - 0 Complex Wound Assessment / Reassessment - multiple wounds []  - 0 Dermatologic / Skin Assessment (not related to wound area) ASSESSMENTS - Focused Assessment []  - Circumferential Edema Measurements - multi extremities 0 []  - 0 Nutritional Assessment / Counseling / Intervention []  - 0 Lower Extremity Assessment (monofilament, tuning fork, pulses) []  - 0 Peripheral Arterial Disease Assessment (using hand held doppler) ASSESSMENTS - Ostomy and/or Continence Assessment and Care []  - Incontinence Assessment and Management 0 []  - 0 Ostomy Care Assessment and Management (repouching, etc.) PROCESS - Coordination of Care X - Simple Patient / Family Education for ongoing care 1 15 []  - 0 Complex (extensive) Patient / Family Education for ongoing care []  - 0 Staff obtains Programmer, systems, Records, Test Results / Process Orders []  - 0 Staff telephones HHA, Nursing Homes / Clarify orders / etc []  - 0 Routine Transfer to another Facility (non-emergent condition) []  - 0 Routine Hospital Admission (non-emergent condition) []  - 0 New Admissions / Biomedical engineer / Ordering NPWT, Apligraf, etc. []  - 0 Emergency Hospital Admission (emergent condition) X- 1 10 Simple Discharge Coordination []  - 0 Complex (extensive) Discharge Coordination PROCESS - Special Needs []  - Pediatric / Minor Patient Management 0 []  - 0 Isolation Patient Management []  - 0 Hearing / Language / Visual special needs []  - 0 Assessment of Community assistance (transportation, D/C planning, etc.) []  - 0 Additional assistance / Altered mentation []  - 0 Support Surface(s) Assessment (bed, cushion, seat, etc.) INTERVENTIONS - Wound Cleansing / Measurement [name], [name] ([id number]) []  - 0 Simple Wound Cleansing - one wound []  - 0 Complex Wound Cleansing - multiple wounds X- 1 5 Wound Imaging (photographs - any number of wounds) []  - 0 Wound Tracing (instead of  photographs) []  - 0 Simple Wound Measurement - one wound []  - 0 Complex Wound Measurement - multiple wounds INTERVENTIONS -  Wound Dressings []  - Small Wound Dressing one or multiple wounds 0 []  - 0 Medium Wound Dressing one or multiple wounds X- 2 20 Large Wound Dressing one or multiple wounds []  - 0 Application of Medications - topical []  - 0 Application of Medications - injection INTERVENTIONS - Miscellaneous []  - External ear exam 0 []  - 0 Specimen Collection (cultures, biopsies, blood, body fluids, etc.) []  - 0 Specimen(s) / Culture(s) sent or taken to Lab for analysis []  - 0 Patient Transfer (multiple staff / Civil Service fast streamer / Similar devices) []  - 0 Simple Staple / Suture removal (25 or less) []  - 0 Complex Staple / Suture removal (26 or more) []  - 0 Hypo / Hyperglycemic Management (close monitor of Blood Glucose) []  - 0 Ankle / Brachial Index (ABI) - do not check if billed separately X- 1 5 Vital Signs Has the patient been seen at the hospital within the last three years: Yes Total Score: 95 Level Of Care: New/Established - Level 3 Electronic Signature(s) Signed: 08/05/2020 5:21:46 PM By: [name] Entered By: Elizabeth Cool, BSN, [name], CWS, [name] on 08/04/2020 14:32:17 [name] ([id number]) -------------------------------------------------------------------------------- Encounter Discharge Information Details Patient Name: Elizabeth Landau C. Date of Service: 08/04/2020 1:30 PM Medical Record Number: [id number] Patient Account Number: [id number] Date of Birth/Sex: January 15, 1956 (64 y.o. F) Treating [name]: [name] Primary Care [name]: [name] Other Clinician: Referring [name]: [name] Treating Minah Axelrod/Extender: [name]: 26 Encounter Discharge Information Items Discharge Condition: Stable Ambulatory Status: Ambulatory Discharge Destination: Home Transportation: Private Auto Accompanied By: self Schedule  Follow-up Appointment: Yes Clinical Summary of Care: Electronic Signature(s) Signed: 08/05/2020 5:21:46 PM By: [name] Entered By: Elizabeth Cool, BSN, [name], CWS, [name] on 08/04/2020 14:33:58 [name] ([id number]) -------------------------------------------------------------------------------- Lower Extremity Assessment Details Patient Name: Elizabeth, MAK. Date of Service: 08/04/2020 1:30 PM Medical Record Number: [id number] Patient Account Number: [id number] Date of Birth/Sex: June 21, 1956 (64 y.o. F) Treating [name]: [name] Primary Care Filip Luten: [name] Other Clinician: Referring Bronte Kropf: [name] Treating Danaija Eskridge/Extender: [name]: 35 Edema Assessment Assessed: [Left: Yes] [Right: Yes] Edema: [Left: Yes] [Right: Yes] Calf Left: Right: Point of Measurement: 32 cm From Medial Instep 76.5 cm 82 cm Ankle Left: Right: Point of Measurement: 9 cm From Medial Instep 45.2 cm 49 cm Vascular Assessment Pulses: Dorsalis Pedis Palpable: [Left:Yes] [Right:Yes] Electronic Signature(s) Signed: 08/04/2020 5:27:46 PM By: [name] Entered By: Elizabeth Mouse, Kenia on 08/04/2020 13:57:31 Privitera, [name] ([id number]) -------------------------------------------------------------------------------- Multi Wound Chart Details Patient Name: [name]. Date of Service: 08/04/2020 1:30 PM Medical Record Number: [id number] Patient Account Number: [id number] Date of Birth/Sex: [birth year] (64 y.o. F) Treating [name]: [name] Primary Care Lorinda Copland: [name] Other Clinician: Referring Nandan Willems: [name] Treating Demonta Wombles/Extender: [name]: 35 Vital Signs Height(in): 63 Pulse(bpm): 58 Weight(lbs): 370 Blood Pressure(mmHg): 167/95 Body Mass Index(BMI): 66 Temperature(F): 98.3 Respiratory Rate(breaths/min): 18 Wound Assessments [name] Notes Electronic Signature(s) Signed:  08/05/2020 5:21:46 PM By: [name] Entered By: Elizabeth Cool, BSN, [name], CWS, [name] on 08/04/2020 14:27:00 [name] ([id number]) -------------------------------------------------------------------------------- [place] Details Patient Name: Elizabeth, ZARLING. Date of Service: 08/04/2020 1:30 PM Medical Record Number: [id number] Patient Account Number: [id number] Date of Birth/Sex: 06-13-1956 (64 y.o. F) Treating [name]: [name] Primary Care Alaric Gladwin: [name] Other Clinician: Referring Kyiesha Millward: [name] Treating Madia Carvell/Extender: [name]: 34 Active Inactive  Abuse / Safety / Falls / Self Care Management Nursing Diagnoses: Potential for falls Goals: Patient will remain injury free related to falls Date Initiated: 11/27/2019 Target Resolution Date: 02/13/2020 Goal Status: Active Interventions: Assess fall risk on admission and as needed Notes: Venous Leg Ulcer Nursing Diagnoses: Potential for venous Insuffiency (use before diagnosis confirmed) Goals: Patient will maintain optimal edema control Date Initiated: 11/27/2019 Target Resolution Date: 02/13/2020 Goal Status: Active Interventions: Compression as ordered Notes: Electronic Signature(s) Signed: 08/04/2020 5:27:46 PM By: Lajean Manes [name] Signed: 08/05/2020 5:21:46 PM By: Elliot Gurney, BSN, [name], CWS, Kim [name], [name] Entered By: Elliot Gurney, BSN, [name], CWS, [name] on 08/04/2020 14:26:49 Kellogg, Ronda Fairly ([id number]) -------------------------------------------------------------------------------- Pain Assessment Details Patient Name: Elizabeth, KOROMA C. Date of Service: 08/04/2020 1:30 PM Medical Record Number: [id number] Patient Account Number: [id number] Date of Birth/Sex: [birth year] (64 y.o. F) Treating [name]: Rogers Blocker Primary Care Mikhail Hallenbeck: Dala Dock Other Clinician: Referring Jaonna Word: Dala Dock Treating Fruma [place]/Extender: Rowan Blase in [name]:  31 Active Problems Location of Pain Severity and Description of Pain Patient Has Paino No Site Locations Rate the pain. Current Pain Level: 0 Pain Management and Medication Current Pain Management: Electronic Signature(s) Signed: 08/04/2020 5:27:46 PM By: Phillis Haggis, Dondra Prader [name] Entered By: Phillis Haggis, Kenia on 08/04/2020 13:48:51 Shareef, Ronda Fairly ([id number]) -------------------------------------------------------------------------------- Patient/Caregiver Education Details Patient Name: [name] Date of Service: 08/04/2020 1:30 PM Medical Record Number: [id number] Patient Account Number: [id number] Date of Birth/Gender: 02-16-1956 (65 y.o. F) Treating [name]: Huel Coventry Primary Care Physician: Dala Dock Other Clinician: Referring Physician: Dala Dock Treating Physician/Extender: Rowan Blase in [name]: 52 Education Assessment Education Provided To: Patient Education Topics Provided Wound/Skin Impairment: Handouts: Caring for Your Ulcer Methods: Demonstration, Explain/Verbal Responses: State content correctly Electronic Signature(s) Signed: 08/05/2020 5:21:46 PM By: Elliot Gurney, BSN, [name], CWS, Kim [name], [name] Entered By: Elliot Gurney, BSN, [name], CWS, [name] on 08/04/2020 14:33:06 [name] ([id number]) -------------------------------------------------------------------------------- Vitals Details Patient Name: [name] Date of Service: 08/04/2020 1:30 PM Medical Record Number: [id number] Patient Account Number: [id number] Date of Birth/Sex: 03-Oct-1955 (64 y.o. F) Treating [name]: Rogers Blocker Primary Care Milagro Belmares: Dala Dock Other Clinician: Referring Elmar Antigua: Dala Dock Treating Izear Pine/Extender: Rowan Blase in [name]: 35 Vital Signs Time Taken: 13:46 Temperature (F): 98.3 Height (in): 63 Pulse (bpm): 58 Weight (lbs): 370 Respiratory Rate (breaths/min): 18 Body Mass Index (BMI): 65.5 Blood Pressure (mmHg): 167/95 Reference  Range: 80 - 120 mg / dl Electronic Signature(s) Signed: 08/04/2020 5:27:46 PM By: Phillis Haggis, Dondra Prader [name] Entered By: Phillis Haggis, Dondra Prader on 08/04/2020 13:48:44

## 2020-09-01 ENCOUNTER — Other Ambulatory Visit
Admission: RE | Admit: 2020-09-01 | Discharge: 2020-09-01 | Disposition: A | Discharge status: Home / Self Care | Payer: Medicare HMO | Source: Ambulatory Visit | Attending: Physician Assistant | Admitting: Physician Assistant

## 2020-09-01 ENCOUNTER — Other Ambulatory Visit: Payer: Self-pay

## 2020-09-01 ENCOUNTER — Encounter: Payer: Medicare HMO | Attending: Physician Assistant | Admitting: Physician Assistant

## 2020-09-01 DIAGNOSIS — B999 Unspecified infectious disease: Secondary | ICD-10-CM | POA: Insufficient documentation

## 2020-09-01 DIAGNOSIS — Q820 Hereditary lymphedema: Secondary | ICD-10-CM | POA: Diagnosis not present

## 2020-09-01 DIAGNOSIS — L97821 Non-pressure chronic ulcer of other part of left lower leg limited to breakdown of skin: Secondary | ICD-10-CM | POA: Diagnosis not present

## 2020-09-01 DIAGNOSIS — Z886 Allergy status to analgesic agent status: Secondary | ICD-10-CM | POA: Diagnosis not present

## 2020-09-01 DIAGNOSIS — E11622 Type 2 diabetes mellitus with other skin ulcer: Secondary | ICD-10-CM | POA: Diagnosis not present

## 2020-09-01 DIAGNOSIS — L97811 Non-pressure chronic ulcer of other part of right lower leg limited to breakdown of skin: Secondary | ICD-10-CM | POA: Insufficient documentation

## 2020-09-01 NOTE — Progress Notes (Signed)
LIS, SAVITT ([id number]) Visit Report for 09/01/2020 Chief Complaint Document Details Patient Name: [name], [name]. Date of Service: 09/01/2020 1:30 PM Medical Record Number: [id number] Patient Account Number: [id number] Date of Birth/Sex: 1956/02/28 (64 y.o. F) Treating RN: Dolan Amen Primary Care Provider: Tandy Gaw Other Clinician: Referring Provider: Tandy Gaw Treating Provider/Extender: Skipper Cliche in Treatment: 68 Information Obtained from: Patient Chief Complaint Bilateral LE lymphedema Electronic Signature(s) Signed: 09/01/2020 1:50:36 PM By: Worthy Keeler PA-[name] Entered By: Worthy Keeler on 09/01/2020 13:50:36 [name], [name] ([id number]) -------------------------------------------------------------------------------- HPI Details Patient Name: [name] Date of Service: 09/01/2020 1:30 PM Medical Record Number: [id number] Patient Account Number: [id number] Date of Birth/Sex: [birth year] (64 y.o. F) Treating RN: Dolan Amen Primary Care Provider: Tandy Gaw Other Clinician: Referring Provider: Tandy Gaw Treating Provider/Extender: Skipper Cliche in Treatment: 52 History of Present Illness HPI Description: The patient is a 65 year old female with history of hypertension and a long-standing history of bilateral lower extremity lymphedema (first presented on 4/2) . She has had open ulcers in the past which have always responded to compression therapy. She had briefly been to a lymphedema clinic in the past which helped her at the time. this time around she stopped treatment of her lymphedema pumps approximately 2 weeks ago because of some pain in the knees and then noticed the right leg getting worse. She was seen by her PCP who put her on clindamycin 4 times a day 2 days ago. The patient has seen [facility] and Dr. Delana Meyer had seen her last year where a vascular study including venous and arterial duplex studies were within normal  limits. he had recommended compression stockings and lymphedema pumps and the patient has been using this in about 2 weeks ago. She is known to be diabetic but in the past few time she's gone to her primary care doctor her hemoglobin A1c has been normal. 02/11/2015 - after her last visit she took my advice and went to the ER regarding the progressive cellulitis of her right lower extremity and she was admitted between July 17 and 22nd. She received IV antibiotics and then was sent home on a course of steroid-induced and oral antibiotics. She has improved much since then. 02/17/2015 -- she has been doing fine and the weeping of her legs has remarkably gone down. She has no fresh issues. READMISSION 01/15/18 This patient was given this clinic before most recently in 2016 seen by Dr. Con Memos. She has massive bilateral lymphedema and over the last 2 months this had weeping edema out of the left leg. She has compression pumps but her compliance with these has been minimal. She has [facility] they've been using TCA/ABDs/kerlix under an Ace wrap.she has had recent problems with cellulitis. She was apparently seen in the ER and 12/23/17 and given clindamycin. She was then followed by her primary doctor and given doxycycline and Keflex. The pain seems to have settled down. In April 2018 the patient had arterial studies done at [facility]. This showed triphasic waveforms throughout the right leg and mostly triphasic waveforms on the left except for monophasic at the posterior tibial artery distally. She was not felt to have evidence of right lower extremity arterial stenosis or significant problems on the left side. 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 She also had a right lower extremity venous Doppler in January 2018 this was limited by the patient's body habitus and lymphedema. Most of the proximal veins  were not visualized The patient presents with an  area of denuded skin on the anterior medial part of the left calf. There is weeping edema fluid here. 01/22/18; the patient has somewhat better edema control using her compression pumps twice a day and as a result she has much better epithelialization on the left anterior calf area. Only a small open area remains. 01/29/18; the patient has been compliant with her compression pumps. Both the areas on her calf that healed. The remaining area on the left anterior leg is fully epithelialized Readmission: 02/20/2019 upon evaluation today patient presents for reevaluation due to issues that she is having with the bilateral lower extremities. She actually has wounds open on both legs. On the right she has an area in the crease of her leg on the right around the knee region which is actually draining quite a bit and actually has some fungal type appearance to it. She has been on nystatin powder that seems to have helped to some degree. In regard to the left lower extremity this is actually in the lower portion of her leg closer to the ankle and again is continuing to drain as well unfortunately. There does not appear to be any signs of active infection at this time which is good news. No fevers, chills, nausea, vomiting, or diarrhea. She tells me that since she was seen last year she is actually been doing quite well for the most part with regard to her lower extremities. Unfortunately she now is experiencing a little bit more drainage at this time. She is concerned about getting this under control so that it does not get significantly worse. 02/27/2019 on evaluation today patient appears to be doing somewhat better in regard to her bilateral lower extremity wounds. She has been tolerating the dressing changes without complication. Fortunately there is no signs of active infection at this point. No fevers, chills, nausea, vomiting, or diarrhea. She did get her dressing supplies which is excellent news she was  extremely excited to get these. She also got paperwork from [employer] for their financial assistance program where they may be able to help her out in the future if needed with supplies at discounted prices. 03/06/2019 on evaluation today patient appears to be doing a little worse with regard to both areas of weeping on her bilateral lower extremities. This is around the right medial knee and just above the left ankle. With that being said she is unfortunately not doing as well as I would like to see. I feel like she may need to potentially go see someone at the lymphedema clinic as the wraps that she needs or even beyond what we can do here at the wound care center. She really does not have wounds she just has open areas of weeping that are causing some difficulty for her. Subsequently because of this and the moisture I am concerned about the potential for infection I am going to likely give her a prophylactic antibiotic today, Keflex, just to be on the safe side. Nonetheless again there is no obvious signs of active infection at this time. 03/13/2019 on evaluation today patient appears to be doing well with regard to her bilateral lower extremities where she has been weeping compared to even last week's evaluation. I see some areas of new skin growth which is excellent and overall I am very pleased with how things seem to be progressing. No fevers, chills, nausea, vomiting, or diarrhea. [name], [name] ([id number]) 03/20/2019 on evaluation today patient  unfortunately is continuing to have issues with significant edema of the left lower extremity. Her right side seems to be doing much better. Unfortunately her left side is showing increased weeping of the lower portion of her leg. This is quite unfortunate obviously we were hoping to get her into the lymphedema clinic they really do not seem to when I see her how if she is draining. Despite the fact this is really not wound related but more lymphedema  weeping related. Nonetheless I do not know that this can be helpful for her to even go for that appointment since again I am not sure there is much that they would actually do at this point. We may need to try a 4 layer compression wrap as best we can on her leg. She is on the Augmentin currently although I am still concerned about whether or not there could be potentially something going on infection wise I would obtain a culture though I understand is not the best being that is a surface culture I just 1 to make sure I do not seem to be missing anything. 03/27/2019 on evaluation today patient appears to be doing much better in regard to the left lower extremity compared to last week. Last week she had tremendous weeping which I think was subsequent to infection now she seems to be doing much better and very pleased. This is not completely healed but there is a lot of new skin growth and it has dried out quite a bit. Overall I think that we are doing well with how things are moving along at this time. No fevers, chills, nausea, vomiting, or diarrhea. 04/03/2019 evaluation today patient appears to be doing a little worse this week compared to last time I saw her. I think this may be due to the fact that she is having issues with not being able to sleep in her bed at least not until last night. She is therefore been in a lift chair and subsequently has also had issues with not been able to use her pumps since she could not get in bed. With that being said the patient overall seems to be doing okay I do think I may want extend the antibiotic for a little bit longer at least until we can see if her edema and her weeping gets better and if it is then obviously I can always discontinue the antibiotics as of next week however I want her to continue to have it over the next week. 04/10/2019 on evaluation today patient unfortunately is still doing poorly with regard to her left lower extremity. Her right is all  things considering doing fairly well. On the left however she continues to have spreading of the area of infection and weeping which appears to be even a larger surface area than noted last week. She did have a positive culture for Pseudomonas in particular which seems to have been of concern she still has green/yellow discharge consistent with Pseudomonas and subsequently a tremendous amount of it. This has me obviously still concerned about the infection not really clearing up despite the fact that on culture it appears the Cipro should have been a good option for treating this. I think she may at this point need IV antibiotics since things are not doing better I do not want to get worse and cause sepsis. She is in agreement with the plan and believes as well that she likely does need to go to the hospital for IV vancomycin.  Or something of the like depending on what the recommendation is from the ER. 04/17/2019 on evaluation today patient appears to be doing excellent in regard to her lower extremity on the left. She was in the hospital for several days from when I sent her last we saw her until just this past Tuesday. Fortunately her drainage is significantly improved and in fact is mostly clear. There is just a couple small areas that may still drain a little bit she states that the [facility] they prescribed for her at discharge she went picked up from pharmacy and got home but has not been able to find it since. She is looked everywhere. She is wondering if I will replace that for her today I will be more than happy to do that. 05/01/2019 on evaluation today patient actually appears to be doing quite well with regard to her lower extremities. She occasionally is having areas that will leak and then heal up mainly when a piece of the fibrotic skin pops off but fortunately she is not having any signs of active infection at this time. Overall she also really does not have any obvious weeping at this  time. I do believe however she really needs some compression wraps and I think this may be a good time to get her back to the lymphedema clinic. 05/11/2019 on evaluation today patient actually appears to be doing quite well with regard to her bilateral lower extremities. She occasionally will have a small area that we per another but in general seems to be completely healed which is great news. Overall very pleased with how everything seems to be progressing. She does have her appointment with lymphedema clinic on November 18. 05/25/2019 on evaluation today patient appears to be doing well with regard to her left lower extremity. I am very pleased in this regard. In regard to her right leg this actually did start draining more I think it is mainly due to the fact that her leg is more swollen. I am not seeing any obvious signs of infection at this time although that is definitely something were obviously acutely aware of simply due to the fact that she had an issue not too far back with exactly this issue. Nonetheless I do feel like that lymphedema clinic would still be beneficial for her. I explained obviously if they are not able to do anything treatment wise on the right leg we could at least have them treat her left leg and then proceed from there. The patient is really in agreement with that plan. If they are able to do both as the drainage slows down that I would be happy to let them handle both. 06/01/2019 on evaluation today patient unfortunately appears to be doing worse with regard to her right lower extremity. The left lower extremity is still maintaining at this point. Unfortunately she has been having significantly increased pain over the past several days and has been experiencing as well increased swelling of the right lower extremity. I really do not know that I am seeing anything that appears to be obvious for infection at this point to be peripherally honest. With that being said the  patient does seem to be having much more swelling that she is even experienced in the past and coupled with increased pain in her hip as well I am concerned that again she could potentially have a DVT although I am not 100% sure of this. I think it something that may need to be checked  out. We discussed the possibility of sending her for a DVT study through the hospital but unfortunately transportation is an issue if she does have a DVT I do not want her to wait days to be able to get in for that test however if she has this scheduled as an outpatient that is as fast that she will be able to get the test scheduled for transportation purposes. That will also fall on Thanksgiving so subsequently she did actually be looking at either Friday or even next week before we would know anything back from this. That is much too long in my opinion. Subsequent to the amount of discomfort she is experiencing the patient is actually okay with going to the ER for evaluation today. 06/12/2019 on evaluation today patient actually appears to be doing significantly better compared to last time I saw her. Following when I last saw her she was actually in the hospital from that Monday until the following Sunday almost 1 full week. She actually was placed on Keflex in the hospital following the time for her to be discharged and Dr. Steva Ready has recommended 2 times a day dosing of the Keflex for the next year in order to help with more prophylactic/preventative measures with regard to her developing cellulitis. Overall I think this sounds like an excellent plan. The patient unfortunately is good to have trouble being treated at lymphedema clinic due to the fact that she really cannot get up on the bed that they have there. They also state that they cannot manage her as long as she has anything draining at this point. Obviously that is somewhat unfortunate as she does need help with edema control but nonetheless we will have  to do what we can for her outside of it sounds like the lymphedema clinic scenario at this point. 06/19/2019 on evaluation today patient appears to be doing fairly well with regard to her bilateral lower extremities. She is not nearly as swollen and shows no signs of infection at this point. There is no evidence of cellulitis whatsoever. She also has no open wounds or draining at this point which is also good news. No fever chills noted. She seems to be in very good spirits and in fact appears to be doing quite well. READMISSION 11/27/2019 [name], [name] ([id number]) This is a 65 year old woman that we have had in this clinic several times before including 2015, 16 and 19 and then most recently from 03/20/2019 through 06/19/2019 with bilateral lower extremity lymphedema. She has had previous arterial and reflux studies done years ago which were not all that remarkable. 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 She does have a positive family history and she had large legs starting may be in her 52s. She was recently in hospital from 10/20/2019 through 10/28/2019 with right leg cellulitis. She was given Ancef and clindamycin and then Zosyn when a culture showed Pseudomonas. At that time there was purulent drainage. She was followed by infectious disease Dr Steva Ready. The patient is now back at home. She has noted increased swelling in the right and no drainage in her right leg mostly on the posterior medial aspect in the calf area. She has not had pain or fever. She has literally been improved lysing above dressings because her at the area of this is far too large for standard compression. She has been wrapping the areas with sheets to resorptive pads. She is found these helped somewhat. She  does have an appointment with the lymphedema clinic in [place] in late June. Past medical history includes bilateral lymphedema, hypertension,  obstructive sleep apnea with CPAP. Recent hospitalization with apparently Pseudomonas cellulitis of the right lower leg 12/15/2019 upon evaluation today patient appears to be doing a little bit worse in regard to her right lower extremity. Unfortunately she is having more weeping down in the lower portion of her leg. Fortunately there is no signs of active infection at this time. No fever chills noted. The patient states she is not having increased pain except for when she attempted to use the lymphedema pumps unfortunately she states that she did have pain when she did this. Otherwise we been using absorptive dressings of one type or another she is using diapers at home and then subsequently Ace wraps. In regard to the barrier cream we have discussed the possibility of derma cloud which she would like to try I do not have a problem with that. 12/22/2019 upon evaluation today patient actually appears to be doing better in regard to her leg ulcers at this point. Fortunately there does not appear to be any signs of active infection which is great news and I am extremely pleased with where things are progressing at this time. There is no sign of active infection currently. The patient is very pleased to see things doing so well. 12/29/2019 upon evaluation today patient appears to be doing a little bit better in regard to her weeping in general over her lower extremities. She does have some signs of mild erythema little bit more than what I noted last week or rather last visit. Nonetheless I think that my threshold for switching her antibiotics from Keflex to something else is very low at this point considering that she has had such severe infections in the past that seem to come almost out of nowhere. There is a little erythema and warmth noted of the lower portion of her leg compared to the upper which also makes me want to go ahead and address things more rapidly at this point. Likely I would switch out  the Keflex for something like Levaquin ideally. 7/16; patient with severe bilateral lymphedema. She has superficial wounds albeit almost circumferential now on the left lateral lower leg. This may be new from last time. Small area on the right anterior lower leg and then another area on the right medial lower leg and of pannus fold. She has been using various absorptive garments. She states she is using her compression pumps once a day occasionally twice. Culture from her last visit here was negative 01/29/2020 on evaluation today patient appears to be doing excellent at this point in regard to her legs with regard to infection I see no signs of active infection at this point. She still does have unfortunately areas of weeping this is minimal on the right now her left is actually significantly worse although I do not think it is as bad as last week with Dr. Dellia Nims saw her. She has been trying to pump and elevate her legs is much as possible. She has previously been on the Keflex and in the past for prevention that seems to do fairly well and likely can extend that today. 02/04/2020 on evaluation today patient appears to be doing better in regard to her legs bilaterally. Fortunately there is no signs of active infection at this time which is great news and overall she has less weeping on the left compared to the right and  there is several spots where she is pretty much sealed up with no draining regions. Overall very pleased in this regard. 02/19/2020 on evaluation today patient appears to be doing very well in regard to her wounds currently. Fortunately there is no evidence of active infection overall very pleased with where things stand. She is significantly improved in regard to her edema I am extremely pleased in this regard she tells me that the popping no longer hurts and in fact she actually looks forward to it. 03/04/2020 on evaluation today patient appears to be doing excellent in regard to her  lower extremities. Fortunately there is no signs of active infection at this time. No fevers, chills, nausea, vomiting, or diarrhea. 03/25/2020 on evaluation today patient appears to be doing a little bit more poorly in regard to her legs at this point. She tells me that she is still continue to have issues with drainage and this has been a little bit worse she was getting ready to start taking the Keflex again but wanted to see me first. Fortunately there is no signs of active infection at this time. No fevers, chills, nausea, vomiting, or diarrhea. 04/15/2020 upon evaluation today patient appears to be doing somewhat poorly in regard to her right leg. She tells me she has been having more pain she has been taking the Keflex that was previously prescribed unfortunately that just does not seem to help with this. She was hoping that the pain on her right was actually coming from the fact that she was having issues with her wrap having gotten caught in her recliner. With that being said she tells me that she knew something was not right. Currently her right leg is warm to touch along with being erythematous all the way up to around at least mid thigh as far as I can see. The left leg does not appear to be doing that badly though there is increased weeping around the ankle region. 05/06/2020 on evaluation today patient appears to be doing much better than last time I saw her. She did go to the hospital where she was admitted for 2 days and treated with antibiotic therapy. She was discharged with antibiotics as well and has done extremely well. I am extremely pleased with where things stand today. There is no signs of active infection at this time which is great news. 05/27/2020 upon evaluation today patient appears to be doing well with regard to her lower extremities bilaterally. She has just a very tiny area on the right leg which is opening on the left leg she is significantly improved though she still  has several areas that do appear to be open this is minimal compared to what is been in the past. In general I am extremely pleased with where things stand today. The patient does tell me she is not been using her pumps quite as much as she should be. I do believe that is 1 area she can definitely work on. She has had a lot going on including a Covid exposure and apparently also a outbreak of likely shingles. 06/24/2020 upon evaluation today patient appears to be doing well with regard to her legs in general although the left leg unfortunately is showing some signs of erythema she does have a little bit of increased weeping and to be honest I am concerned about infection here. I discussed that with her today and I think that we may need to address this sooner rather than later she has been  taking Keflex she is not really certain that is been making a big improvement however. No fevers, chills, nausea, vomiting, or diarrhea. [name], [name] ([id number]) 06/30/2020 upon evaluation today patient's legs actually seem to be doing better in my opinion as compared to where they were last week. Fortunately there does not appear to be any signs of active infection. Her culture showed multiple organisms nothing predominate. With that being said the Levaquin seems to have done well I think she has improved since I last saw her as well. 07/14/2020 upon evaluation today patient appears to be doing actually better in regard to her lower extremities in my opinion. She has been tolerating the dressing changes without complication. Fortunately there is no signs of active infection at this time. 08/04/2020 upon evaluation today patient appears to be doing excellent in regard to her leg ulcers. Fortunately she has very little that is open at this point. This is great news. In fact I think that the Goldbond medicated powder has been excellent for her. It seems to have done the trick where we had tried several other things  without as much success. Fortunately there is no evidence of active infection at this time. No fevers, chills, nausea, vomiting, or diarrhea. 09/01/2020 upon evaluation today patient appears to be doing a little bit more poorly than the last time I saw her. She tells me right now that she has been having a lot of drainage compared to where things were previous which has unfortunately led to more irritation as well. She is concerned that this is leading to infection. Again based on what I am seeing today as well I am also concerned of the same to be honest. Electronic Signature(s) Signed: 09/01/2020 2:43:02 PM By: Worthy Keeler PA-[name] Entered By: Worthy Keeler on 09/01/2020 [name], [name] ([id number]) -------------------------------------------------------------------------------- Physical Exam Details Patient Name: Elizabeth Landau [name]. Date of Service: 09/01/2020 1:30 PM Medical Record Number: [id number] Patient Account Number: [id number] Date of Birth/Sex: 1955/10/03 (64 y.o. F) Treating RN: Dolan Amen Primary Care Provider: Tandy Gaw Other Clinician: Referring Provider: Tandy Gaw Treating Provider/Extender: Skipper Cliche in Treatment: 38 Constitutional Obese and well-hydrated in no acute distress. Respiratory normal breathing without difficulty. Psychiatric this patient is able to make decisions and demonstrates good insight into disease process. Alert and Oriented x 3. pleasant and cooperative. Notes Upon inspection patient's wound bed actually showed signs again of having mainly skin breakdown there is really no significant issues with regard to purulent drainage though there was some drainage I did actually go ahead and obtain a culture from this today. With that being said we can see how things do over the next couple weeks. In the interim I did think that the patient warranted going ahead and put her back on antibiotic she is done well with the Bactrim in the  past I think we can look into that. Electronic Signature(s) Signed: 09/01/2020 2:43:33 PM By: Worthy Keeler PA-[name] Entered By: Worthy Keeler on 09/01/2020 14:43:33 [name], [name] ([id number]) -------------------------------------------------------------------------------- Physician Orders Details Patient Name: [name] Date of Service: 09/01/2020 1:30 PM Medical Record Number: [id number] Patient Account Number: [id number] Date of Birth/Sex: 08/22/1955 (64 y.o. 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 Bathing/ Shower/ Hygiene o May shower; gently cleanse wound with antibacterial soap, rinse and pat dry prior to dressing wounds Edema Control - Lymphedema / Segmental Compressive Device / Other Bilateral Lower Extremities o Ace wraps - Chux secured with ace wraps o Elevate, Exercise Daily and Avoid Standing for Long Periods of Time. o Compression Pump: Use compression pump on left lower extremity for 60 minutes, twice daily. o Compression Pump: Use compression pump on right lower extremity for 60 minutes, twice daily. o DO YOUR BEST to sleep in the bed at night. DO NOT sleep in your recliner. Long hours of sitting in a recliner leads to swelling of the legs and/or potential wounds on your backside. Additional Orders / Instructions o Other: - Gold Bond Powder Medications-Please add to medication list. o Topical Antibiotic - Bactrim Laboratory o Bacteria identified in  Wound by Culture (MICRO) - Left lower leg oooo LOINC Code: 7412-8 NOM[facility] Convenience Name: Wound culture routine Patient Medications Allergies: ibuprofen, ACE Inhibitors Notifications Medication Indication Start End Bactrim DS 09/01/2020 DOSE 1 - oral 800 mg-160 mg tablet - 1 tablet oral taken 2 times per day for 14 days Electronic Signature(s) Signed: 09/01/2020 2:45:23 PM By: Worthy Keeler PA-[name] Entered By: Worthy Keeler on 09/01/2020 14:45:23 Osmer, [name] ([id number]) -------------------------------------------------------------------------------- Problem List Details Patient Name: [name]. Date of Service: 09/01/2020 1:30 PM Medical Record Number: [id number] Patient Account Number: [id number] Date of Birth/Sex: 1955-11-02 (64 y.o. 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 Date of Service: 09/01/2020 1:30 PM Medical Record Number: [id number] Patient Account Number: [id number] Date of Birth/Sex: [birth year] (64 y.o. F) Treating RN: Dolan Amen Primary Care Provider: Tandy Gaw Other Clinician: Referring Provider: Tandy Gaw Treating Provider/Extender:  Skipper Cliche in Treatment: 48 Subjective Chief Complaint Information obtained from Patient Bilateral LE lymphedema History of Present Illness (HPI) The patient is a 65 year old female with history of hypertension and a long-standing history of bilateral lower extremity lymphedema (first presented on 4/2) . She has had open ulcers in the past which have always responded to compression therapy. She had briefly been to a lymphedema clinic in the past which helped her at the time. this time around she stopped treatment of her lymphedema pumps approximately 2 weeks ago because of some pain in the knees and then noticed the right leg getting worse. She was seen by her PCP who put her on clindamycin 4 times a day 2 days ago. The patient has seen [facility] and Dr. Delana Meyer had seen her last year where a vascular study including venous and arterial duplex studies were within normal limits. he had recommended compression stockings and lymphedema pumps and the patient has been using this in about 2  weeks ago. She is known to be diabetic but in the past few time she's gone to her primary care doctor her hemoglobin A1c has been normal. 02/11/2015 - after her last visit she took my advice and went to the ER regarding the progressive cellulitis of her right lower extremity and she was admitted between July 17 and 22nd. She received IV antibiotics and then was sent home on a course of steroid-induced and oral antibiotics. She has improved much since then. 02/17/2015 -- she has been doing fine and the weeping of her legs has remarkably gone down. She has no fresh issues. READMISSION 01/15/18 This patient was given this clinic before most recently in 2016 seen by Dr. Con Memos. She has massive bilateral lymphedema and over the last 2 months this had weeping edema out of the left leg. She has compression pumps but her compliance with these has been minimal. She has [facility] they've been using  TCA/ABDs/kerlix under an Ace wrap.she has had recent problems with cellulitis. She was apparently seen in the ER and 12/23/17 and given clindamycin. She was then followed by her primary doctor and given doxycycline and Keflex. The pain seems to have settled down. In April 2018 the patient had arterial studies done at [facility]. This showed triphasic waveforms throughout the right leg and mostly triphasic waveforms on the left except for monophasic at the posterior tibial artery distally. She was not felt to have evidence of right lower extremity arterial stenosis or significant problems on the left side. 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 She also had a right lower extremity venous Doppler in January 2018 this was limited by the patient's body habitus and lymphedema. Most of the proximal veins were not visualized The patient presents with an area of denuded skin on the anterior medial part of the left calf. There is weeping edema fluid here. 01/22/18; the patient has somewhat better edema control using her compression pumps twice a day and as a result she has much better epithelialization on the left anterior calf area. Only a small open area remains. 01/29/18; the patient has been compliant with her compression pumps. Both the areas on her calf that healed. The remaining area on the left anterior leg is fully epithelialized Readmission: 02/20/2019 upon evaluation today patient presents for reevaluation due to issues that she is having with the bilateral lower extremities. She actually has wounds open on both legs. On the right she has an area in the crease of her leg on the right around the knee region which is actually draining quite a bit and actually has some fungal type appearance to it. She has been on nystatin powder that seems to have helped to some degree. In regard to the left lower extremity this is actually in the lower portion of her leg  closer to the ankle and again is continuing to drain as well unfortunately. There does not appear to be any signs of active infection at this time which is good news. No fevers, chills, nausea, vomiting, or diarrhea. She tells me that since she was seen last year she is actually been doing quite well for the most part with regard to her lower extremities. Unfortunately she now is experiencing a little bit more drainage at this time. She is concerned about getting this under control so that it does not get significantly worse. 02/27/2019 on evaluation today patient appears to be doing somewhat better in regard to her bilateral  lower extremity wounds. She has been tolerating the dressing changes without complication. Fortunately there is no signs of active infection at this point. No fevers, chills, nausea, vomiting, or diarrhea. She did get her dressing supplies which is excellent news she was extremely excited to get these. She also got paperwork from [employer] for their financial assistance program where they may be able to help her out in the future if needed with supplies at discounted prices. 03/06/2019 on evaluation today patient appears to be doing a little worse with regard to both areas of weeping on her bilateral lower extremities. This is around the right medial knee and just above the left ankle. With that being said she is unfortunately not doing as well as I would like to see. I feel like she may need to potentially go see someone at the lymphedema clinic as the wraps that she needs or even beyond what we can do here at the wound care center. She really does not have wounds she just has open areas of weeping that are causing some difficulty for her. Subsequently because of this and the moisture I am concerned about the potential for infection I am going to likely give her a prophylactic antibiotic today, Keflex, just to be on the safe side. Nonetheless again there is no obvious signs of active  infection at this time. LEIGHTON, LUSTER ([id number]) 03/13/2019 on evaluation today patient appears to be doing well with regard to her bilateral lower extremities where she has been weeping compared to even last week's evaluation. I see some areas of new skin growth which is excellent and overall I am very pleased with how things seem to be progressing. No fevers, chills, nausea, vomiting, or diarrhea. 03/20/2019 on evaluation today patient unfortunately is continuing to have issues with significant edema of the left lower extremity. Her right side seems to be doing much better. Unfortunately her left side is showing increased weeping of the lower portion of her leg. This is quite unfortunate obviously we were hoping to get her into the lymphedema clinic they really do not seem to when I see her how if she is draining. Despite the fact this is really not wound related but more lymphedema weeping related. Nonetheless I do not know that this can be helpful for her to even go for that appointment since again I am not sure there is much that they would actually do at this point. We may need to try a 4 layer compression wrap as best we can on her leg. She is on the Augmentin currently although I am still concerned about whether or not there could be potentially something going on infection wise I would obtain a culture though I understand is not the best being that is a surface culture I just 1 to make sure I do not seem to be missing anything. 03/27/2019 on evaluation today patient appears to be doing much better in regard to the left lower extremity compared to last week. Last week she had tremendous weeping which I think was subsequent to infection now she seems to be doing much better and very pleased. This is not completely healed but there is a lot of new skin growth and it has dried out quite a bit. Overall I think that we are doing well with how things are moving along at this time. No fevers,  chills, nausea, vomiting, or diarrhea. 04/03/2019 evaluation today patient appears to be doing a little worse this week compared to last  time I saw her. I think this may be due to the fact that she is having issues with not being able to sleep in her bed at least not until last night. She is therefore been in a lift chair and subsequently has also had issues with not been able to use her pumps since she could not get in bed. With that being said the patient overall seems to be doing okay I do think I may want extend the antibiotic for a little bit longer at least until we can see if her edema and her weeping gets better and if it is then obviously I can always discontinue the antibiotics as of next week however I want her to continue to have it over the next week. 04/10/2019 on evaluation today patient unfortunately is still doing poorly with regard to her left lower extremity. Her right is all things considering doing fairly well. On the left however she continues to have spreading of the area of infection and weeping which appears to be even a larger surface area than noted last week. She did have a positive culture for Pseudomonas in particular which seems to have been of concern she still has green/yellow discharge consistent with Pseudomonas and subsequently a tremendous amount of it. This has me obviously still concerned about the infection not really clearing up despite the fact that on culture it appears the Cipro should have been a good option for treating this. I think she may at this point need IV antibiotics since things are not doing better I do not want to get worse and cause sepsis. She is in agreement with the plan and believes as well that she likely does need to go to the hospital for IV vancomycin. Or something of the like depending on what the recommendation is from the ER. 04/17/2019 on evaluation today patient appears to be doing excellent in regard to her lower extremity on the  left. She was in the hospital for several days from when I sent her last we saw her until just this past Tuesday. Fortunately her drainage is significantly improved and in fact is mostly clear. There is just a couple small areas that may still drain a little bit she states that the [facility] they prescribed for her at discharge she went picked up from pharmacy and got home but has not been able to find it since. She is looked everywhere. She is wondering if I will replace that for her today I will be more than happy to do that. 05/01/2019 on evaluation today patient actually appears to be doing quite well with regard to her lower extremities. She occasionally is having areas that will leak and then heal up mainly when a piece of the fibrotic skin pops off but fortunately she is not having any signs of active infection at this time. Overall she also really does not have any obvious weeping at this time. I do believe however she really needs some compression wraps and I think this may be a good time to get her back to the lymphedema clinic. 05/11/2019 on evaluation today patient actually appears to be doing quite well with regard to her bilateral lower extremities. She occasionally will have a small area that we per another but in general seems to be completely healed which is great news. Overall very pleased with how everything seems to be progressing. She does have her appointment with lymphedema clinic on November 18. 05/25/2019 on evaluation today patient appears to be  doing well with regard to her left lower extremity. I am very pleased in this regard. In regard to her right leg this actually did start draining more I think it is mainly due to the fact that her leg is more swollen. I am not seeing any obvious signs of infection at this time although that is definitely something were obviously acutely aware of simply due to the fact that she had an issue not too far back with exactly this issue.  Nonetheless I do feel like that lymphedema clinic would still be beneficial for her. I explained obviously if they are not able to do anything treatment wise on the right leg we could at least have them treat her left leg and then proceed from there. The patient is really in agreement with that plan. If they are able to do both as the drainage slows down that I would be happy to let them handle both. 06/01/2019 on evaluation today patient unfortunately appears to be doing worse with regard to her right lower extremity. The left lower extremity is still maintaining at this point. Unfortunately she has been having significantly increased pain over the past several days and has been experiencing as well increased swelling of the right lower extremity. I really do not know that I am seeing anything that appears to be obvious for infection at this point to be peripherally honest. With that being said the patient does seem to be having much more swelling that she is even experienced in the past and coupled with increased pain in her hip as well I am concerned that again she could potentially have a DVT although I am not 100% sure of this. I think it something that may need to be checked out. We discussed the possibility of sending her for a DVT study through the hospital but unfortunately transportation is an issue if she does have a DVT I do not want her to wait days to be able to get in for that test however if she has this scheduled as an outpatient that is as fast that she will be able to get the test scheduled for transportation purposes. That will also fall on Thanksgiving so subsequently she did actually be looking at either Friday or even next week before we would know anything back from this. That is much too long in my opinion. Subsequent to the amount of discomfort she is experiencing the patient is actually okay with going to the ER for evaluation today. 06/12/2019 on evaluation today patient  actually appears to be doing significantly better compared to last time I saw her. Following when I last saw her she was actually in the hospital from that Monday until the following Sunday almost 1 full week. She actually was placed on Keflex in the hospital following the time for her to be discharged and Dr. Steva Ready has recommended 2 times a day dosing of the Keflex for the next year in order to help with more prophylactic/preventative measures with regard to her developing cellulitis. Overall I think this sounds like an excellent plan. The patient unfortunately is good to have trouble being treated at lymphedema clinic due to the fact that she really cannot get up on the bed that they have there. They also state that they cannot manage her as long as she has anything draining at this point. Obviously that is somewhat unfortunate as she does need help with edema control but nonetheless we will have to do what we can for  her outside of it sounds like the lymphedema clinic scenario at this point. 06/19/2019 on evaluation today patient appears to be doing fairly well with regard to her bilateral lower extremities. She is not nearly as swollen [name], [name]. ([id number]) and shows no signs of infection at this point. There is no evidence of cellulitis whatsoever. She also has no open wounds or draining at this point which is also good news. No fever chills noted. She seems to be in very good spirits and in fact appears to be doing quite well. READMISSION 11/27/2019 This is a 65 year old woman that we have had in this clinic several times before including 2015, 16 and 19 and then most recently from 03/20/2019 through 06/19/2019 with bilateral lower extremity lymphedema. She has had previous arterial and reflux studies done years ago which were not all that remarkable. 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 She does have a positive family  history and she had large legs starting may be in her 49s. She was recently in hospital from 10/20/2019 through 10/28/2019 with right leg cellulitis. She was given Ancef and clindamycin and then Zosyn when a culture showed Pseudomonas. At that time there was purulent drainage. She was followed by infectious disease Dr Steva Ready. The patient is now back at home. She has noted increased swelling in the right and no drainage in her right leg mostly on the posterior medial aspect in the calf area. She has not had pain or fever. She has literally been improved lysing above dressings because her at the area of this is far too large for standard compression. She has been wrapping the areas with sheets to resorptive pads. She is found these helped somewhat. She does have an appointment with the lymphedema clinic in [place] in late June. Past medical history includes bilateral lymphedema, hypertension, obstructive sleep apnea with CPAP. Recent hospitalization with apparently Pseudomonas cellulitis of the right lower leg 12/15/2019 upon evaluation today patient appears to be doing a little bit worse in regard to her right lower extremity. Unfortunately she is having more weeping down in the lower portion of her leg. Fortunately there is no signs of active infection at this time. No fever chills noted. The patient states she is not having increased pain except for when she attempted to use the lymphedema pumps unfortunately she states that she did have pain when she did this. Otherwise we been using absorptive dressings of one type or another she is using diapers at home and then subsequently Ace wraps. In regard to the barrier cream we have discussed the possibility of derma cloud which she would like to try I do not have a problem with that. 12/22/2019 upon evaluation today patient actually appears to be doing better in regard to her leg ulcers at this point. Fortunately there does not appear to be any signs  of active infection which is great news and I am extremely pleased with where things are progressing at this time. There is no sign of active infection currently. The patient is very pleased to see things doing so well. 12/29/2019 upon evaluation today patient appears to be doing a little bit better in regard to her weeping in general over her lower extremities. She does have some signs of mild erythema little bit more than what I noted last week or rather last visit. Nonetheless I think that my threshold for switching her antibiotics from Keflex to something else is very low  at this point considering that she has had such severe infections in the past that seem to come almost out of nowhere. There is a little erythema and warmth noted of the lower portion of her leg compared to the upper which also makes me want to go ahead and address things more rapidly at this point. Likely I would switch out the Keflex for something like Levaquin ideally. 7/16; patient with severe bilateral lymphedema. She has superficial wounds albeit almost circumferential now on the left lateral lower leg. This may be new from last time. Small area on the right anterior lower leg and then another area on the right medial lower leg and of pannus fold. She has been using various absorptive garments. She states she is using her compression pumps once a day occasionally twice. Culture from her last visit here was negative 01/29/2020 on evaluation today patient appears to be doing excellent at this point in regard to her legs with regard to infection I see no signs of active infection at this point. She still does have unfortunately areas of weeping this is minimal on the right now her left is actually significantly worse although I do not think it is as bad as last week with Dr. Dellia Nims saw her. She has been trying to pump and elevate her legs is much as possible. She has previously been on the Keflex and in the past for prevention  that seems to do fairly well and likely can extend that today. 02/04/2020 on evaluation today patient appears to be doing better in regard to her legs bilaterally. Fortunately there is no signs of active infection at this time which is great news and overall she has less weeping on the left compared to the right and there is several spots where she is pretty much sealed up with no draining regions. Overall very pleased in this regard. 02/19/2020 on evaluation today patient appears to be doing very well in regard to her wounds currently. Fortunately there is no evidence of active infection overall very pleased with where things stand. She is significantly improved in regard to her edema I am extremely pleased in this regard she tells me that the popping no longer hurts and in fact she actually looks forward to it. 03/04/2020 on evaluation today patient appears to be doing excellent in regard to her lower extremities. Fortunately there is no signs of active infection at this time. No fevers, chills, nausea, vomiting, or diarrhea. 03/25/2020 on evaluation today patient appears to be doing a little bit more poorly in regard to her legs at this point. She tells me that she is still continue to have issues with drainage and this has been a little bit worse she was getting ready to start taking the Keflex again but wanted to see me first. Fortunately there is no signs of active infection at this time. No fevers, chills, nausea, vomiting, or diarrhea. 04/15/2020 upon evaluation today patient appears to be doing somewhat poorly in regard to her right leg. She tells me she has been having more pain she has been taking the Keflex that was previously prescribed unfortunately that just does not seem to help with this. She was hoping that the pain on her right was actually coming from the fact that she was having issues with her wrap having gotten caught in her recliner. With that being said she tells me that she knew  something was not right. Currently her right leg is warm to touch along with being  erythematous all the way up to around at least mid thigh as far as I can see. The left leg does not appear to be doing that badly though there is increased weeping around the ankle region. 05/06/2020 on evaluation today patient appears to be doing much better than last time I saw her. She did go to the hospital where she was admitted for 2 days and treated with antibiotic therapy. She was discharged with antibiotics as well and has done extremely well. I am extremely pleased with where things stand today. There is no signs of active infection at this time which is great news. 05/27/2020 upon evaluation today patient appears to be doing well with regard to her lower extremities bilaterally. She has just a very tiny area on the right leg which is opening on the left leg she is significantly improved though she still has several areas that do appear to be open this is minimal compared to what is been in the past. In general I am extremely pleased with where things stand today. The patient does tell me she is not been using her pumps quite as much as she should be. I do believe that is 1 area she can definitely work on. She has had a lot going on including a Covid exposure and apparently also a outbreak of likely shingles. [name], [name] ([id number]) 06/24/2020 upon evaluation today patient appears to be doing well with regard to her legs in general although the left leg unfortunately is showing some signs of erythema she does have a little bit of increased weeping and to be honest I am concerned about infection here. I discussed that with her today and I think that we may need to address this sooner rather than later she has been taking Keflex she is not really certain that is been making a big improvement however. No fevers, chills, nausea, vomiting, or diarrhea. 06/30/2020 upon evaluation today patient's legs actually  seem to be doing better in my opinion as compared to where they were last week. Fortunately there does not appear to be any signs of active infection. Her culture showed multiple organisms nothing predominate. With that being said the Levaquin seems to have done well I think she has improved since I last saw her as well. 07/14/2020 upon evaluation today patient appears to be doing actually better in regard to her lower extremities in my opinion. She has been tolerating the dressing changes without complication. Fortunately there is no signs of active infection at this time. 08/04/2020 upon evaluation today patient appears to be doing excellent in regard to her leg ulcers. Fortunately she has very little that is open at this point. This is great news. In fact I think that the [employer] medicated powder has been excellent for her. It seems to have done the trick where we had tried several other things without as much success. Fortunately there is no evidence of active infection at this time. No fevers, chills, nausea, vomiting, or diarrhea. 09/01/2020 upon evaluation today patient appears to be doing a little bit more poorly than the last time I saw her. She tells me right now that she has been having a lot of drainage compared to where things were previous which has unfortunately led to more irritation as well. She is concerned that this is leading to infection. Again based on what I am seeing today as well I am also concerned of the same to be honest. Objective Constitutional Obese and well-hydrated in no acute  distress. Vitals Time Taken: 1:35 PM, Height: 63 in, Weight: 370 lbs, BMI: 65.5, Temperature: 98.1 F, Pulse: 78 bpm, Respiratory Rate: 18 breaths/min, Blood Pressure: 151/80 mmHg. Respiratory normal breathing without difficulty. Psychiatric this patient is able to make decisions and demonstrates good insight into disease process. Alert and Oriented x 3. pleasant and cooperative. General  Notes: Upon inspection patient's wound bed actually showed signs again of having mainly skin breakdown there is really no significant issues with regard to purulent drainage though there was some drainage I did actually go ahead and obtain a culture from this today. With that being said we can see how things do over the next couple weeks. In the interim I did think that the patient warranted going ahead and put her back on antibiotic she is done well with the Bactrim in the past I think we can look into that. 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 Bathing/ Shower/ Hygiene: May shower; gently cleanse wound with antibacterial soap, rinse and pat dry prior to dressing wounds Edema Control - Lymphedema / Segmental Compressive Device / Other: Ace wraps - Chux secured with ace wraps [name], [name]. ([id number]) Elevate, Exercise Daily and Avoid Standing for Long Periods of Time. Compression Pump: Use compression pump on left lower extremity for 60 minutes, twice daily. Compression Pump: Use compression pump on right lower extremity for 60 minutes, twice daily. DO YOUR BEST to sleep in the bed at night. DO NOT sleep in your recliner. Long hours of sitting in a recliner leads to swelling of the legs and/or potential wounds on your backside. Additional Orders / Instructions: Other: Raquel James Powder Medications-Please add to medication list.: Topical Antibiotic - Bactrim Laboratory ordered were: Wound culture routine - Left lower leg The following medication(s) was prescribed: Bactrim DS oral 800 mg-160 mg tablet 1 1 tablet oral taken 2 times per day for 14 days starting 09/01/2020 1. Would recommend currently that we go ahead and initiate  treatment with a reinitiation of the Bactrim which has previously done well for her. The patient is in agreement with the plan. I think that is going to do a good job as far as helping with clearing up any likely infection. 2. I am also can recommend at this time that we have the patient go ahead and continue with the dressings as before she is using a powder such as baby powder otherwise to help with absorbing some of the moisture and then subsequently she is going to be as well utilizing the Chux pads to catch the drainage which she is changing as needed. 3. I am also going to recommend that she continue to monitor for any signs of worsening infection. If she notices any spreading or worsening of the erythema she knows to go to the ER as soon as possible she has been down that road before hopefully however we are catching this soon enough that will not become an issue. We will see patient back for reevaluation in 1 week here in the clinic. If anything worsens or changes patient will contact our office for additional recommendations. Electronic Signature(s) Signed: 09/01/2020 2:46:20 PM By: Worthy Keeler PA-[name] Entered By: Worthy Keeler on 09/01/2020 14:46:20 Pasley, [name] ([id number]) -------------------------------------------------------------------------------- SuperBill Details Patient Name: [name]  Date of Service: 09/01/2020 Medical Record Number: [id number] Patient Account Number: [id number] Date of Birth/Sex: 02/17/1956 (65 y.o. F) Treating RN: Dolan Amen Primary Care Provider: Tandy Gaw Other Clinician: Referring Provider: Tandy Gaw Treating Provider/Extender: Skipper Cliche in Treatment: 39 Diagnosis Coding ICD-10 Codes Code Description Q82.0 Hereditary lymphedema L97.811 Non-pressure chronic ulcer of other part of right lower leg limited to breakdown of skin L97.821 Non-pressure chronic ulcer of other part of left lower leg limited to breakdown of  skin Facility Procedures CPT4 Code: [id number] Description: [phone] - WOUND CARE VISIT-LEV 2 EST PT Modifier: Quantity: 1 Physician Procedures CPT4 Code: 9458592 Description: 92446 - WC PHYS LEVEL 4 - EST PT Modifier: Quantity: 1 CPT4 Code: Description: ICD-10 Diagnosis Description Q82.0 Hereditary lymphedema L97.811 Non-pressure chronic ulcer of other part of right lower leg limited to bre L97.821 Non-pressure chronic ulcer of other part of left lower leg limited to brea Modifier: akdown of skin kdown of skin Quantity: Electronic Signature(s) Signed: 09/01/2020 2:46:49 PM By: Worthy Keeler PA-[name] Entered By: Worthy Keeler on 09/01/2020 14:46:49

## 2020-09-02 NOTE — Progress Notes (Signed)
DASJA, BRASE ([id number]) Visit Report for 09/01/2020 Arrival Information Details Patient Name: [name], [name]. Date of Service: 09/01/2020 1:30 PM Medical Record Number: [id number] Patient Account Number: [id number] Date of Birth/Sex: 02/11/1956 (64 y.o. F) Treating RN: Dolan Amen Primary Care Provider: Tandy Gaw Other Clinician: Referring Provider: Tandy Gaw Treating Provider/Extender: Skipper Cliche in Treatment: 68 Visit Information History Since Last Visit Added or deleted any medications: No Patient Arrived: [name] Any new allergies or adverse reactions: No Arrival Time: 13:37 Had a fall or experienced change in No Accompanied By: self activities of daily living that may affect Transfer Assistance: None risk of falls: Patient Identification Verified: Yes Signs or symptoms of abuse/neglect since last visito No Secondary Verification Process Completed: Yes Hospitalized since last visit: No Patient Requires Transmission-Based Precautions: No Implantable device outside of the clinic excluding No Patient Has Alerts: No cellular tissue based products placed in the center since last visit: Has Dressing in Place as Prescribed: Yes Has Compression in Place as Prescribed: Yes Pain Present Now: No Electronic Signature(s) Signed: 09/02/2020 4:23:58 PM By: Jeanine Luz Entered By: Jeanine Luz on 09/01/2020 13:55:06 Coates, [name] ([id number]) -------------------------------------------------------------------------------- Clinic Level of Care Assessment Details Patient Name: [name]. Date of Service: 09/01/2020 1:30 PM Medical Record Number: [id number] Patient Account Number: [id number] Date of Birth/Sex: [birth year] (64 y.o. F) Treating RN: Dolan Amen Primary Care Provider: Tandy Gaw Other Clinician: Referring Provider: Tandy Gaw Treating Provider/Extender: Skipper Cliche in Treatment: 47 Clinic Level of Care Assessment Items TOOL  4 Quantity Score X - Use when only an EandM is performed on FOLLOW-UP visit 1 0 ASSESSMENTS - Nursing Assessment / Reassessment X - Reassessment of Co-morbidities (includes updates in patient status) 1 10 X- 1 5 Reassessment of Adherence to Treatment Plan ASSESSMENTS - Wound and Skin Assessment / Reassessment []  - Simple Wound Assessment / Reassessment - one wound 0 []  - 0 Complex Wound Assessment / Reassessment - multiple wounds []  - 0 Dermatologic / Skin Assessment (not related to wound area) ASSESSMENTS - Focused Assessment X - Circumferential Edema Measurements - multi extremities 1 5 []  - 0 Nutritional Assessment / Counseling / Intervention X- 1 5 Lower Extremity Assessment (monofilament, tuning fork, pulses) []  - 0 Peripheral Arterial Disease Assessment (using hand held doppler) ASSESSMENTS - Ostomy and/or Continence Assessment and Care []  - Incontinence Assessment and Management 0 []  - 0 Ostomy Care Assessment and Management (repouching, etc.) PROCESS - Coordination of Care X - Simple Patient / Family Education for ongoing care 1 15 []  - 0 Complex (extensive) Patient / Family Education for ongoing care []  - 0 Staff obtains Programmer, systems, Records, Test Results / Process Orders []  - 0 Staff telephones HHA, Nursing Homes / Clarify orders / etc []  - 0 Routine Transfer to another Facility (non-emergent condition) []  - 0 Routine Hospital Admission (non-emergent condition) []  - 0 New Admissions / Biomedical engineer / Ordering NPWT, Apligraf, etc. []  - 0 Emergency Hospital Admission (emergent condition) X- 1 10 Simple Discharge Coordination []  - 0 Complex (extensive) Discharge Coordination PROCESS - Special Needs []  - Pediatric / Minor Patient Management 0 []  - 0 Isolation Patient Management []  - 0 Hearing / Language / Visual special needs []  - 0 Assessment of Community assistance (transportation, D/C planning, etc.) []  - 0 Additional assistance / Altered  mentation []  - 0 Support Surface(s) Assessment (bed, cushion, seat, etc.) INTERVENTIONS - Wound Cleansing / Measurement [place], Sharee C. ([id number]) []  - 0 Simple Wound Cleansing - one wound []  -  0 Complex Wound Cleansing - multiple wounds []  - 0 Wound Imaging (photographs - any number of wounds) []  - 0 Wound Tracing (instead of photographs) []  - 0 Simple Wound Measurement - one wound []  - 0 Complex Wound Measurement - multiple wounds INTERVENTIONS - Wound Dressings []  - Small Wound Dressing one or multiple wounds 0 []  - 0 Medium Wound Dressing one or multiple wounds []  - 0 Large Wound Dressing one or multiple wounds []  - 0 Application of Medications - topical []  - 0 Application of Medications - injection INTERVENTIONS - Miscellaneous []  - External ear exam 0 X- 1 5 Specimen Collection (cultures, biopsies, blood, body fluids, etc.) X- 1 5 Specimen(s) / Culture(s) sent or taken to Lab for analysis []  - 0 Patient Transfer (multiple staff / Civil Service fast streamer / Similar devices) []  - 0 Simple Staple / Suture removal (25 or less) []  - 0 Complex Staple / Suture removal (26 or more) []  - 0 Hypo / Hyperglycemic Management (close monitor of Blood Glucose) []  - 0 Ankle / Brachial Index (ABI) - do not check if billed separately X- 1 5 Vital Signs Has the patient been seen at the hospital within the last three years: Yes Total Score: 65 Level Of Care: New/Established - Level 2 Electronic Signature(s) Signed: 09/01/2020 4:52:24 PM By: Georges Mouse, Minus Breeding RN Entered By: Georges Mouse, Minus Breeding on 09/01/2020 14:40:32 Fredericks, [name] ([id number]) -------------------------------------------------------------------------------- Encounter Discharge Information Details Patient Name: [name]. Date of Service: 09/01/2020 1:30 PM Medical Record Number: [id number] Patient Account Number: [id number] Date of Birth/Sex: [birth year] (64 y.o. F) Treating RN: Dolan Amen Primary Care  Provider: Tandy Gaw Other Clinician: Referring Provider: Tandy Gaw Treating Provider/Extender: Skipper Cliche in Treatment: 35 Encounter Discharge Information Items Discharge Condition: Stable Ambulatory Status: Cane Discharge Destination: Home Transportation: Private Auto Accompanied By: self Schedule Follow-up Appointment: Yes Clinical Summary of Care: Electronic Signature(s) Signed: 09/01/2020 4:52:24 PM By: Georges Mouse, Minus Breeding RN Entered By: Georges Mouse, Minus Breeding on 09/01/2020 14:41:52 [name], [name] ([id number]) -------------------------------------------------------------------------------- Lower Extremity Assessment Details Patient Name: [name], [name] Date of Service: 09/01/2020 1:30 PM Medical Record Number: [id number] Patient Account Number: [id number] Date of Birth/Sex: 1955/11/07 (64 y.o. F) Treating RN: Dolan Amen Primary Care Provider: Tandy Gaw Other Clinician: Referring Provider: Tandy Gaw Treating Provider/Extender: Skipper Cliche in Treatment: 39 Edema Assessment Assessed: [Left: No] [Right: No] [Left: Edema] [Right: :] Calf Left: Right: Point of Measurement: 32 cm From Medial Instep 77.3 cm 83 cm Ankle Left: Right: Point of Measurement: 9 cm From Medial Instep 43.5 cm 48 cm Knee To Floor Left: Right: From Medial Instep 44 cm Electronic Signature(s) Signed: 09/01/2020 4:52:24 PM By: Georges Mouse, Minus Breeding RN Signed: 09/02/2020 4:23:58 PM By: Jeanine Luz Entered By: Jeanine Luz on 09/01/2020 14:04:36 Bright, [name] ([id number]) -------------------------------------------------------------------------------- Multi Wound Chart Details Patient Name: Elizabeth Landau C. Date of Service: 09/01/2020 1:30 PM Medical Record Number: [id number] Patient Account Number: [id number] Date of Birth/Sex: Jul 27, 1955 (64 y.o. F) Treating RN: Dolan Amen Primary Care Provider: Tandy Gaw Other Clinician: Referring Provider:  Tandy Gaw Treating Provider/Extender: Skipper Cliche in Treatment: 42 Vital Signs Height(in): 63 Pulse(bpm): 78 Weight(lbs): 370 Blood Pressure(mmHg): 151/80 Body Mass Index(BMI): 66 Temperature(F): 98.1 Respiratory Rate(breaths/min): 18 Wound Assessments Treatment Notes Electronic Signature(s) Signed: 09/01/2020 4:52:24 PM By: Georges Mouse, Minus Breeding RN Entered By: Georges Mouse, Minus Breeding on 09/01/2020 14:34:40 [name], [name] ([id number]) -------------------------------------------------------------------------------- Multi-Disciplinary Care Plan Details Patient Name: [name]. Date of Service: 09/01/2020 1:30 PM Medical Record  Number: [id number] Patient Account Number: [id number] Date of Birth/Sex: 1956/01/23 (64 y.o. F) Treating RN: Dolan Amen Primary Care Provider: Tandy Gaw Other Clinician: Referring Provider: Tandy Gaw Treating Provider/Extender: Skipper Cliche in Treatment: 5 Active Inactive Abuse / Safety / Falls / Self Care Management Nursing Diagnoses: Potential for falls Goals: Patient will remain injury free related to falls Date Initiated: 11/27/2019 Target Resolution Date: 02/13/2020 Goal Status: Active Interventions: Assess fall risk on admission and as needed Notes: Venous Leg Ulcer Nursing Diagnoses: Potential for venous Insuffiency (use before diagnosis confirmed) Goals: Patient will maintain optimal edema control Date Initiated: 11/27/2019 Target Resolution Date: 02/13/2020 Goal Status: Active Interventions: Compression as ordered Notes: Electronic Signature(s) Signed: 09/01/2020 4:52:24 PM By: Georges Mouse, Minus Breeding RN Entered By: Georges Mouse, Minus Breeding on 09/01/2020 14:34:23 [name], [name] ([id number]) -------------------------------------------------------------------------------- Non-Wound Condition Assessment Details Patient Name: [name]. Date of Service: 09/01/2020 1:30 PM Medical Record Number:  [id number] Patient Account Number: [id number] Date of Birth/Gender: 08/26/1955 (65 y.o. F) Treating RN: Dolan Amen Primary Care Physician: Tandy Gaw Other Clinician: Referring Physician: Tandy Gaw Treating Physician/Extender: Skipper Cliche in Treatment: 69 Non-Wound Condition: Condition: Lymphedema Location: Leg Side: Right Notes: Right leg with decreased drainage this visit. Photos Notes increased drainage Electronic Signature(s) Signed: 09/01/2020 4:52:24 PM By: Georges Mouse, Minus Breeding RN Signed: 09/02/2020 4:23:58 PM By: Jeanine Luz Entered By: Jeanine Luz on 09/01/2020 15:05:10 [name], [name] ([id number]) -------------------------------------------------------------------------------- Non-Wound Condition Assessment Details Patient Name: [name], [name] Date of Service: 09/01/2020 1:30 PM Medical Record Number: [id number] Patient Account Number: [id number] Date of Birth/Gender: 1955/10/02 (65 y.o. F) Treating RN: Dolan Amen Primary Care Physician: Tandy Gaw Other Clinician: Referring Physician: Tandy Gaw Treating Physician/Extender: Skipper Cliche in Treatment: 12 Non-Wound Condition: Condition: Lymphedema Location: Leg Side: Left Notes: decreased drainage this visit. Photos Notes increased drainage Electronic Signature(s) Signed: 09/01/2020 4:52:24 PM By: Georges Mouse, Minus Breeding RN Signed: 09/02/2020 4:23:58 PM By: Jeanine Luz Entered By: Jeanine Luz on 09/01/2020 15:05:10 [name], [name] ([id number]) -------------------------------------------------------------------------------- Pain Assessment Details Patient Name: [name], [name] Date of Service: 09/01/2020 1:30 PM Medical Record Number: [id number] Patient Account Number: [id number] Date of Birth/Sex: 1956-05-06 (64 y.o. F) Treating RN: Dolan Amen Primary Care Provider: Tandy Gaw Other Clinician: Referring Provider: Tandy Gaw Treating Provider/Extender:  Skipper Cliche in Treatment: 39 Active Problems Location of Pain Severity and Description of Pain Patient Has Paino No Site Locations Pain Management and Medication Current Pain Management: Electronic Signature(s) Signed: 09/01/2020 4:52:24 PM By: Georges Mouse, Minus Breeding RN Signed: 09/02/2020 4:23:58 PM By: Jeanine Luz Entered By: Jeanine Luz on 09/01/2020 13:55:19 [name], [name] ([id number]) -------------------------------------------------------------------------------- Patient/Caregiver Education Details Patient Name: [name]. Date of Service: 09/01/2020 1:30 PM Medical Record Number: [id number] Patient Account Number: [id number] Date of Birth/Gender: 04-Feb-1956 (65 y.o. F) Treating RN: Dolan Amen Primary Care Physician: Tandy Gaw Other Clinician: Referring Physician: Tandy Gaw Treating Physician/Extender: Skipper Cliche in Treatment: 9 Education Assessment Education Provided To: Patient Education Topics Provided Wound/Skin Impairment: Methods: Explain/Verbal Responses: State content correctly Notes antibiotic education, signs/symptoms of infection Electronic Signature(s) Signed: 09/01/2020 4:52:24 PM By: Georges Mouse, Minus Breeding RN Entered By: Georges Mouse, Minus Breeding on 09/01/2020 14:41:15 Klee, [name] ([id number]) -------------------------------------------------------------------------------- [place] Details Patient Name: [name] Date of Service: 09/01/2020 1:30 PM Medical Record Number: [id number] Patient Account Number: [id number] Date of Birth/Sex: 1955/09/26 (64 y.o. F) Treating RN: Dolan Amen Primary Care Provider: Tandy Gaw Other Clinician: Referring Provider: Tandy Gaw Treating Provider/Extender: Skipper Cliche in Treatment: [id number]  Vital Signs Time Taken: 13:35 Temperature (F): 98.1 Height (in): 63 Pulse (bpm): 78 Weight (lbs): 370 Respiratory Rate (breaths/min): 18 Body Mass Index (BMI):  65.5 Blood Pressure (mmHg): 151/80 Reference Range: 80 - 120 mg / dl Electronic Signature(s) Signed: 09/02/2020 4:23:58 PM By: Jeanine Luz Entered By: Jeanine Luz on 09/01/2020 13:55:12

## 2020-09-04 LAB — AEROBIC CULTURE W GRAM STAIN (SUPERFICIAL SPECIMEN): Gram Stain: NONE SEEN

## 2020-09-15 ENCOUNTER — Other Ambulatory Visit: Payer: Self-pay

## 2020-09-15 ENCOUNTER — Encounter: Payer: Medicare HMO | Attending: Physician Assistant | Admitting: Physician Assistant

## 2020-09-15 DIAGNOSIS — I10 Essential (primary) hypertension: Secondary | ICD-10-CM | POA: Insufficient documentation

## 2020-09-15 DIAGNOSIS — E11622 Type 2 diabetes mellitus with other skin ulcer: Secondary | ICD-10-CM | POA: Diagnosis not present

## 2020-09-15 DIAGNOSIS — L97821 Non-pressure chronic ulcer of other part of left lower leg limited to breakdown of skin: Secondary | ICD-10-CM | POA: Diagnosis not present

## 2020-09-15 DIAGNOSIS — Q820 Hereditary lymphedema: Secondary | ICD-10-CM | POA: Insufficient documentation

## 2020-09-15 DIAGNOSIS — L97811 Non-pressure chronic ulcer of other part of right lower leg limited to breakdown of skin: Secondary | ICD-10-CM | POA: Insufficient documentation

## 2020-09-15 NOTE — Progress Notes (Signed)
[name], [name] ([id number]) Visit Report for 09/15/2020 Chief Complaint Document Details Patient Name: [name], [name]. Date of Service: 09/15/2020 10:00 AM Medical Record Number: [id number] Patient Account Number: [id number] Date of Birth/Sex: 01-04-1956 (65 y.o. F) Treating RN: Dolan Amen Primary Care Provider: Tandy Gaw Other Clinician: Jeanine Luz Referring Provider: Tandy Gaw Treating Provider/Extender: Skipper Cliche in Treatment: 78 Information Obtained from: Patient Chief Complaint Bilateral LE lymphedema Electronic Signature(s) Signed: 09/15/2020 10:31:38 AM By: Worthy Keeler PA-C Entered By: Worthy Keeler on 09/15/2020 10:31:37 [name], [name] ([id number]) -------------------------------------------------------------------------------- HPI Details Patient Name: [name] Date of Service: 09/15/2020 10:00 AM Medical Record Number: [id number] Patient Account Number: [id number] Date of Birth/Sex: [birth year] (65 y.o. F) Treating RN: Dolan Amen Primary Care Provider: Tandy Gaw Other Clinician: Jeanine Luz Referring Provider: Tandy Gaw Treating Provider/Extender: Skipper Cliche in Treatment: 44 History of Present Illness HPI Description: The patient is a 65 year old female with history of hypertension and a long-standing history of bilateral lower extremity lymphedema (first presented on 4/2) . She has had open ulcers in the past which have always responded to compression therapy. She had briefly been to a lymphedema clinic in the past which helped her at the time. this time around she stopped treatment of her lymphedema pumps approximately 2 weeks ago because of some pain in the knees and then noticed the right leg getting worse. She was seen by her PCP who put her on clindamycin 4 times a day 2 days ago. The patient has seen [facility] and Dr. Delana Meyer had seen her last year where a vascular study including venous and arterial duplex  studies were within normal limits. he had recommended compression stockings and lymphedema pumps and the patient has been using this in about 2 weeks ago. She is known to be diabetic but in the past few time she's gone to her primary care doctor her hemoglobin A1c has been normal. 02/11/2015 - after her last visit she took my advice and went to the ER regarding the progressive cellulitis of her right lower extremity and she was admitted between July 17 and 22nd. She received IV antibiotics and then was sent home on a course of steroid-induced and oral antibiotics. She has improved much since then. 02/17/2015 -- she has been doing fine and the weeping of her legs has remarkably gone down. She has no fresh issues. READMISSION 01/15/18 This patient was given this clinic before most recently in 2016 seen by Dr. Con Memos. She has massive bilateral lymphedema and over the last 2 months this had weeping edema out of the left leg. She has compression pumps but her compliance with these has been minimal. She has [facility] they've been using TCA/ABDs/kerlix under an Ace wrap.she has had recent problems with cellulitis. She was apparently seen in the ER and 12/23/17 and given clindamycin. She was then followed by her primary doctor and given doxycycline and Keflex. The pain seems to have settled down. In April 2018 the patient had arterial studies done at [facility]. This showed triphasic waveforms throughout the right leg and mostly triphasic waveforms on the left except for monophasic at the posterior tibial artery distally. She was not felt to have evidence of right lower extremity arterial stenosis or significant problems on the left side. 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 She also had a right lower extremity venous Doppler in January 2018 this was limited by the patient's body habitus and lymphedema. Most  of the proximal veins were not  visualized The patient presents with an area of denuded skin on the anterior medial part of the left calf. There is weeping edema fluid here. 01/22/18; the patient has somewhat better edema control using her compression pumps twice a day and as a result she has much better epithelialization on the left anterior calf area. Only a small open area remains. 01/29/18; the patient has been compliant with her compression pumps. Both the areas on her calf that healed. The remaining area on the left anterior leg is fully epithelialized Readmission: 02/20/2019 upon evaluation today patient presents for reevaluation due to issues that she is having with the bilateral lower extremities. She actually has wounds open on both legs. On the right she has an area in the crease of her leg on the right around the knee region which is actually draining quite a bit and actually has some fungal type appearance to it. She has been on nystatin powder that seems to have helped to some degree. In regard to the left lower extremity this is actually in the lower portion of her leg closer to the ankle and again is continuing to drain as well unfortunately. There does not appear to be any signs of active infection at this time which is good news. No fevers, chills, nausea, vomiting, or diarrhea. She tells me that since she was seen last year she is actually been doing quite well for the most part with regard to her lower extremities. Unfortunately she now is experiencing a little bit more drainage at this time. She is concerned about getting this under control so that it does not get significantly worse. 02/27/2019 on evaluation today patient appears to be doing somewhat better in regard to her bilateral lower extremity wounds. She has been tolerating the dressing changes without complication. Fortunately there is no signs of active infection at this point. No fevers, chills, nausea, vomiting, or diarrhea. She did get her dressing  supplies which is excellent news she was extremely excited to get these. She also got paperwork from [employer] for their financial assistance program where they may be able to help her out in the future if needed with supplies at discounted prices. 03/06/2019 on evaluation today patient appears to be doing a little worse with regard to both areas of weeping on her bilateral lower extremities. This is around the right medial knee and just above the left ankle. With that being said she is unfortunately not doing as well as I would like to see. I feel like she may need to potentially go see someone at the lymphedema clinic as the wraps that she needs or even beyond what we can do here at the wound care center. She really does not have wounds she just has open areas of weeping that are causing some difficulty for her. Subsequently because of this and the moisture I am concerned about the potential for infection I am going to likely give her a prophylactic antibiotic today, Keflex, just to be on the safe side. Nonetheless again there is no obvious signs of active infection at this time. 03/13/2019 on evaluation today patient appears to be doing well with regard to her bilateral lower extremities where she has been weeping compared to even last week's evaluation. I see some areas of new skin growth which is excellent and overall I am very pleased with how things seem to be progressing. No fevers, chills, nausea, vomiting, or diarrhea. [name], [name] ([id number]) 03/20/2019  on evaluation today patient unfortunately is continuing to have issues with significant edema of the left lower extremity. Her right side seems to be doing much better. Unfortunately her left side is showing increased weeping of the lower portion of her leg. This is quite unfortunate obviously we were hoping to get her into the lymphedema clinic they really do not seem to when I see her how if she is draining. Despite the fact this is really  not wound related but more lymphedema weeping related. Nonetheless I do not know that this can be helpful for her to even go for that appointment since again I am not sure there is much that they would actually do at this point. We may need to try a 4 layer compression wrap as best we can on her leg. She is on the Augmentin currently although I am still concerned about whether or not there could be potentially something going on infection wise I would obtain a culture though I understand is not the best being that is a surface culture I just 1 to make sure I do not seem to be missing anything. 03/27/2019 on evaluation today patient appears to be doing much better in regard to the left lower extremity compared to last week. Last week she had tremendous weeping which I think was subsequent to infection now she seems to be doing much better and very pleased. This is not completely healed but there is a lot of new skin growth and it has dried out quite a bit. Overall I think that we are doing well with how things are moving along at this time. No fevers, chills, nausea, vomiting, or diarrhea. 04/03/2019 evaluation today patient appears to be doing a little worse this week compared to last time I saw her. I think this may be due to the fact that she is having issues with not being able to sleep in her bed at least not until last night. She is therefore been in a lift chair and subsequently has also had issues with not been able to use her pumps since she could not get in bed. With that being said the patient overall seems to be doing okay I do think I may want extend the antibiotic for a little bit longer at least until we can see if her edema and her weeping gets better and if it is then obviously I can always discontinue the antibiotics as of next week however I want her to continue to have it over the next week. 04/10/2019 on evaluation today patient unfortunately is still doing poorly with regard to her  left lower extremity. Her right is all things considering doing fairly well. On the left however she continues to have spreading of the area of infection and weeping which appears to be even a larger surface area than noted last week. She did have a positive culture for Pseudomonas in particular which seems to have been of concern she still has green/yellow discharge consistent with Pseudomonas and subsequently a tremendous amount of it. This has me obviously still concerned about the infection not really clearing up despite the fact that on culture it appears the Cipro should have been a good option for treating this. I think she may at this point need IV antibiotics since things are not doing better I do not want to get worse and cause sepsis. She is in agreement with the plan and believes as well that she likely does need to go to the  hospital for IV vancomycin. Or something of the like depending on what the recommendation is from the ER. 04/17/2019 on evaluation today patient appears to be doing excellent in regard to her lower extremity on the left. She was in the hospital for several days from when I sent her last we saw her until just this past Tuesday. Fortunately her drainage is significantly improved and in fact is mostly clear. There is just a couple small areas that may still drain a little bit she states that the [facility] they prescribed for her at discharge she went picked up from pharmacy and got home but has not been able to find it since. She is looked everywhere. She is wondering if I will replace that for her today I will be more than happy to do that. 05/01/2019 on evaluation today patient actually appears to be doing quite well with regard to her lower extremities. She occasionally is having areas that will leak and then heal up mainly when a piece of the fibrotic skin pops off but fortunately she is not having any signs of active infection at this time. Overall she also really does  not have any obvious weeping at this time. I do believe however she really needs some compression wraps and I think this may be a good time to get her back to the lymphedema clinic. 05/11/2019 on evaluation today patient actually appears to be doing quite well with regard to her bilateral lower extremities. She occasionally will have a small area that we per another but in general seems to be completely healed which is great news. Overall very pleased with how everything seems to be progressing. She does have her appointment with lymphedema clinic on November 18. 05/25/2019 on evaluation today patient appears to be doing well with regard to her left lower extremity. I am very pleased in this regard. In regard to her right leg this actually did start draining more I think it is mainly due to the fact that her leg is more swollen. I am not seeing any obvious signs of infection at this time although that is definitely something were obviously acutely aware of simply due to the fact that she had an issue not too far back with exactly this issue. Nonetheless I do feel like that lymphedema clinic would still be beneficial for her. I explained obviously if they are not able to do anything treatment wise on the right leg we could at least have them treat her left leg and then proceed from there. The patient is really in agreement with that plan. If they are able to do both as the drainage slows down that I would be happy to let them handle both. 06/01/2019 on evaluation today patient unfortunately appears to be doing worse with regard to her right lower extremity. The left lower extremity is still maintaining at this point. Unfortunately she has been having significantly increased pain over the past several days and has been experiencing as well increased swelling of the right lower extremity. I really do not know that I am seeing anything that appears to be obvious for infection at this point to be  peripherally honest. With that being said the patient does seem to be having much more swelling that she is even experienced in the past and coupled with increased pain in her hip as well I am concerned that again she could potentially have a DVT although I am not 100% sure of this. I think it something that may  need to be checked out. We discussed the possibility of sending her for a DVT study through the hospital but unfortunately transportation is an issue if she does have a DVT I do not want her to wait days to be able to get in for that test however if she has this scheduled as an outpatient that is as fast that she will be able to get the test scheduled for transportation purposes. That will also fall on Thanksgiving so subsequently she did actually be looking at either Friday or even next week before we would know anything back from this. That is much too long in my opinion. Subsequent to the amount of discomfort she is experiencing the patient is actually okay with going to the ER for evaluation today. 06/12/2019 on evaluation today patient actually appears to be doing significantly better compared to last time I saw her. Following when I last saw her she was actually in the hospital from that Monday until the following Sunday almost 1 full week. She actually was placed on Keflex in the hospital following the time for her to be discharged and Dr. Steva Ready has recommended 2 times a day dosing of the Keflex for the next year in order to help with more prophylactic/preventative measures with regard to her developing cellulitis. Overall I think this sounds like an excellent plan. The patient unfortunately is good to have trouble being treated at lymphedema clinic due to the fact that she really cannot get up on the bed that they have there. They also state that they cannot manage her as long as she has anything draining at this point. Obviously that is somewhat unfortunate as she does need help  with edema control but nonetheless we will have to do what we can for her outside of it sounds like the lymphedema clinic scenario at this point. 06/19/2019 on evaluation today patient appears to be doing fairly well with regard to her bilateral lower extremities. She is not nearly as swollen and shows no signs of infection at this point. There is no evidence of cellulitis whatsoever. She also has no open wounds or draining at this point which is also good news. No fever chills noted. She seems to be in very good spirits and in fact appears to be doing quite well. READMISSION 11/27/2019 [name], [name] ([id number]) This is a 65 year old woman that we have had in this clinic several times before including 2015, 16 and 19 and then most recently from 03/20/2019 through 06/19/2019 with bilateral lower extremity lymphedema. She has had previous arterial and reflux studies done years ago which were not all that remarkable. 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 She does have a positive family history and she had large legs starting may be in her 11s. She was recently in hospital from 10/20/2019 through 10/28/2019 with right leg cellulitis. She was given Ancef and clindamycin and then Zosyn when a culture showed Pseudomonas. At that time there was purulent drainage. She was followed by infectious disease Dr Steva Ready. The patient is now back at home. She has noted increased swelling in the right and no drainage in her right leg mostly on the posterior medial aspect in the calf area. She has not had pain or fever. She has literally been improved lysing above dressings because her at the area of this is far too large for standard compression. She has been wrapping the areas with sheets to resorptive pads. She is found  these helped somewhat. She does have an appointment with the lymphedema clinic in [place] in late June. Past medical history  includes bilateral lymphedema, hypertension, obstructive sleep apnea with CPAP. Recent hospitalization with apparently Pseudomonas cellulitis of the right lower leg 12/15/2019 upon evaluation today patient appears to be doing a little bit worse in regard to her right lower extremity. Unfortunately she is having more weeping down in the lower portion of her leg. Fortunately there is no signs of active infection at this time. No fever chills noted. The patient states she is not having increased pain except for when she attempted to use the lymphedema pumps unfortunately she states that she did have pain when she did this. Otherwise we been using absorptive dressings of one type or another she is using diapers at home and then subsequently Ace wraps. In regard to the barrier cream we have discussed the possibility of derma cloud which she would like to try I do not have a problem with that. 12/22/2019 upon evaluation today patient actually appears to be doing better in regard to her leg ulcers at this point. Fortunately there does not appear to be any signs of active infection which is great news and I am extremely pleased with where things are progressing at this time. There is no sign of active infection currently. The patient is very pleased to see things doing so well. 12/29/2019 upon evaluation today patient appears to be doing a little bit better in regard to her weeping in general over her lower extremities. She does have some signs of mild erythema little bit more than what I noted last week or rather last visit. Nonetheless I think that my threshold for switching her antibiotics from Keflex to something else is very low at this point considering that she has had such severe infections in the past that seem to come almost out of nowhere. There is a little erythema and warmth noted of the lower portion of her leg compared to the upper which also makes me want to go ahead and address things more  rapidly at this point. Likely I would switch out the Keflex for something like Levaquin ideally. 7/16; patient with severe bilateral lymphedema. She has superficial wounds albeit almost circumferential now on the left lateral lower leg. This may be new from last time. Small area on the right anterior lower leg and then another area on the right medial lower leg and of pannus fold. She has been using various absorptive garments. She states she is using her compression pumps once a day occasionally twice. Culture from her last visit here was negative 01/29/2020 on evaluation today patient appears to be doing excellent at this point in regard to her legs with regard to infection I see no signs of active infection at this point. She still does have unfortunately areas of weeping this is minimal on the right now her left is actually significantly worse although I do not think it is as bad as last week with Dr. Dellia Nims saw her. She has been trying to pump and elevate her legs is much as possible. She has previously been on the Keflex and in the past for prevention that seems to do fairly well and likely can extend that today. 02/04/2020 on evaluation today patient appears to be doing better in regard to her legs bilaterally. Fortunately there is no signs of active infection at this time which is great news and overall she has less weeping on the left compared  to the right and there is several spots where she is pretty much sealed up with no draining regions. Overall very pleased in this regard. 02/19/2020 on evaluation today patient appears to be doing very well in regard to her wounds currently. Fortunately there is no evidence of active infection overall very pleased with where things stand. She is significantly improved in regard to her edema I am extremely pleased in this regard she tells me that the popping no longer hurts and in fact she actually looks forward to it. 03/04/2020 on evaluation today patient  appears to be doing excellent in regard to her lower extremities. Fortunately there is no signs of active infection at this time. No fevers, chills, nausea, vomiting, or diarrhea. 03/25/2020 on evaluation today patient appears to be doing a little bit more poorly in regard to her legs at this point. She tells me that she is still continue to have issues with drainage and this has been a little bit worse she was getting ready to start taking the Keflex again but wanted to see me first. Fortunately there is no signs of active infection at this time. No fevers, chills, nausea, vomiting, or diarrhea. 04/15/2020 upon evaluation today patient appears to be doing somewhat poorly in regard to her right leg. She tells me she has been having more pain she has been taking the Keflex that was previously prescribed unfortunately that just does not seem to help with this. She was hoping that the pain on her right was actually coming from the fact that she was having issues with her wrap having gotten caught in her recliner. With that being said she tells me that she knew something was not right. Currently her right leg is warm to touch along with being erythematous all the way up to around at least mid thigh as far as I can see. The left leg does not appear to be doing that badly though there is increased weeping around the ankle region. 05/06/2020 on evaluation today patient appears to be doing much better than last time I saw her. She did go to the hospital where she was admitted for 2 days and treated with antibiotic therapy. She was discharged with antibiotics as well and has done extremely well. I am extremely pleased with where things stand today. There is no signs of active infection at this time which is great news. 05/27/2020 upon evaluation today patient appears to be doing well with regard to her lower extremities bilaterally. She has just a very tiny area on the right leg which is opening on the left leg  she is significantly improved though she still has several areas that do appear to be open this is minimal compared to what is been in the past. In general I am extremely pleased with where things stand today. The patient does tell me she is not been using her pumps quite as much as she should be. I do believe that is 1 area she can definitely work on. She has had a lot going on including a Covid exposure and apparently also a outbreak of likely shingles. 06/24/2020 upon evaluation today patient appears to be doing well with regard to her legs in general although the left leg unfortunately is showing some signs of erythema she does have a little bit of increased weeping and to be honest I am concerned about infection here. I discussed that with her today and I think that we may need to address this sooner rather than  later she has been taking Keflex she is not really certain that is been making a big improvement however. No fevers, chills, nausea, vomiting, or diarrhea. [name], [name] ([id number]) 06/30/2020 upon evaluation today patient's legs actually seem to be doing better in my opinion as compared to where they were last week. Fortunately there does not appear to be any signs of active infection. Her culture showed multiple organisms nothing predominate. With that being said the Levaquin seems to have done well I think she has improved since I last saw her as well. 07/14/2020 upon evaluation today patient appears to be doing actually better in regard to her lower extremities in my opinion. She has been tolerating the dressing changes without complication. Fortunately there is no signs of active infection at this time. 08/04/2020 upon evaluation today patient appears to be doing excellent in regard to her leg ulcers. Fortunately she has very little that is open at this point. This is great news. In fact I think that the Goldbond medicated powder has been excellent for her. It seems to have done the  trick where we had tried several other things without as much success. Fortunately there is no evidence of active infection at this time. No fevers, chills, nausea, vomiting, or diarrhea. 09/01/2020 upon evaluation today patient appears to be doing a little bit more poorly than the last time I saw her. She tells me right now that she has been having a lot of drainage compared to where things were previous which has unfortunately led to more irritation as well. She is concerned that this is leading to infection. Again based on what I am seeing today as well I am also concerned of the same to be honest. 09/15/2020 upon evaluation today patient appears to be doing well with regard to her legs compared to what I saw previous. Fortunately there does not appear to be any evidence of active infection at this time which is great news. At least not as badly as what it was previous. With that being said I do believe that the patient does need to have an extension of the antibiotics. This I believe will actually help her more in the way of making sure this stays under control and does not worsen. That was the Bactrim DS. Electronic Signature(s) Signed: 09/15/2020 10:41:15 AM By: Worthy Keeler PA-C Entered By: Worthy Keeler on 09/15/2020 10:41:15 Calender, [name] ([id number]) -------------------------------------------------------------------------------- Physical Exam Details Patient Name: ANGEL, HOBDY C. Date of Service: 09/15/2020 10:00 AM Medical Record Number: [id number] Patient Account Number: [id number] Date of Birth/Sex: 01/03/1956 (65 y.o. F) Treating RN: Dolan Amen Primary Care Provider: Tandy Gaw Other Clinician: Jeanine Luz Referring Provider: Tandy Gaw Treating Provider/Extender: Skipper Cliche in Treatment: 38 Constitutional Well-nourished and well-hydrated in no acute distress. Respiratory normal breathing without difficulty. Psychiatric this patient is able to  make decisions and demonstrates good insight into disease process. Alert and Oriented x 3. pleasant and cooperative. Notes Upon inspection patient's wound bed actually showed signs of good granulation epithelization at this point. There does not appear to be any evidence of infection which is great news. No fevers, chills, nausea, vomiting, or diarrhea. I do think that I would extend the Bactrim for her though as we discussed during the office visit today. Electronic Signature(s) Signed: 09/15/2020 10:41:30 AM By: Worthy Keeler PA-C Entered By: Worthy Keeler on 09/15/2020 10:41:30 [name], [name] ([id number]) -------------------------------------------------------------------------------- Physician Orders Details Patient Name: [name] Date of Service: 09/15/2020  10:00 AM Medical Record Number: [id number] Patient Account Number: [id number] Date of Birth/Sex: September 01, 1955 (65 y.o. F) Treating RN: Dolan Amen Primary Care Provider: Tandy Gaw Other Clinician: Jeanine Luz Referring Provider: Tandy Gaw Treating Provider/Extender: Skipper Cliche in Treatment: 61 Verbal / Phone Orders: No Diagnosis Coding ICD-10 Coding Code Description Q82.0 Hereditary lymphedema L97.811 Non-pressure chronic ulcer of other part of right lower leg limited to breakdown of skin L97.821 Non-pressure chronic ulcer of other part of left lower leg limited to breakdown of skin Follow-up Appointments o Return Appointment in 2 weeks. Bathing/ Shower/ Hygiene o May shower; gently cleanse wound with antibacterial soap, rinse and pat dry prior to dressing wounds Edema Control - Lymphedema / Segmental Compressive Device / Other Bilateral Lower Extremities o Ace wraps - Chux secured with ace wraps o Elevate, Exercise Daily and Avoid Standing for Long Periods of Time. o Compression Pump: Use compression pump on left lower extremity for 60 minutes, twice daily. o Compression Pump:  Use compression pump on right lower extremity for 60 minutes, twice daily. o DO YOUR BEST to sleep in the bed at night. DO NOT sleep in your recliner. Long hours of sitting in a recliner leads to swelling of the legs and/or potential wounds on your backside. Non-Wound Condition Bilateral Lower Extremities o Additional non-wound orders/instructions: - Gold Bond Powder/Nystatin powder in clinic Medications-Please add to medication list. o Topical Antibiotic - bactrim x 14 days Patient Medications Allergies: ibuprofen, ACE Inhibitors Notifications Medication Indication Start End Bactrim DS 09/15/2020 DOSE 1 - oral 800 mg-160 mg tablet - 1 tablet oral taken 2 times per day for 14 days Electronic Signature(s) Signed: 09/15/2020 10:42:40 AM By: Worthy Keeler PA-C Entered By: Worthy Keeler on 09/15/2020 10:42:40 [name], [name] ([id number]) -------------------------------------------------------------------------------- Problem List Details Patient Name: [name]. Date of Service: 09/15/2020 10:00 AM Medical Record Number: [id number] Patient Account Number: [id number] Date of Birth/Sex: February 10, 1956 (64 y.o. 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 Date of Service: 09/15/2020 10:00 AM Medical Record Number: [id number] Patient Account Number: [id number] Date of Birth/Sex: Sep 01, 1955 (65 y.o. F) Treating RN: Dolan Amen Primary Care Provider: Tandy Gaw Other Clinician: Jeanine Luz Referring Provider: Tandy Gaw Treating Provider/Extender: Skipper Cliche in Treatment: 28 Subjective Chief Complaint Information obtained from Patient Bilateral LE lymphedema History of Present Illness (HPI) The patient is a 65 year old female with history of hypertension and a long-standing history of bilateral lower extremity lymphedema (first presented on 4/2) . She has had open ulcers in the past which have always responded to compression therapy. She had briefly been to a lymphedema clinic in the past which helped her at the time. this time around she stopped treatment of her lymphedema pumps approximately 2 weeks ago because of some pain in the knees and then noticed the right leg getting worse. She was seen by her  PCP who put her on clindamycin 4 times a day 2 days ago. The patient has seen [facility] and Dr. Delana Meyer had seen her last year where a vascular study including venous and arterial duplex studies were within normal limits. he had recommended compression stockings and lymphedema pumps and the patient has been using this in about 2 weeks ago. She is known to be diabetic but in the past few time she's gone to her primary care doctor her hemoglobin A1c has been normal. 02/11/2015 - after her last visit she took my advice and went to the ER regarding the progressive cellulitis of her right lower extremity and she was admitted between July 17 and 22nd. She received IV antibiotics and then was sent home on a course of steroid-induced and oral antibiotics. She has improved much since  then. 02/17/2015 -- she has been doing fine and the weeping of her legs has remarkably gone down. She has no fresh issues. READMISSION 01/15/18 This patient was given this clinic before most recently in 2016 seen by Dr. Con Memos. She has massive bilateral lymphedema and over the last 2 months this had weeping edema out of the left leg. She has compression pumps but her compliance with these has been minimal. She has [facility] they've been using TCA/ABDs/kerlix under an Ace wrap.she has had recent problems with cellulitis. She was apparently seen in the ER and 12/23/17 and given clindamycin. She was then followed by her primary doctor and given doxycycline and Keflex. The pain seems to have settled down. In April 2018 the patient had arterial studies done at [facility]. This showed triphasic waveforms throughout the right leg and mostly triphasic waveforms on the left except for monophasic at the posterior tibial artery distally. She was not felt to have evidence of right lower extremity arterial stenosis or significant problems on the left side. 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 She also had a right lower extremity venous Doppler in January 2018 this was limited by the patient's body habitus and lymphedema. Most of the proximal veins were not visualized The patient presents with an area of denuded skin on the anterior medial part of the left calf. There is weeping edema fluid here. 01/22/18; the patient has somewhat better edema control using her compression pumps twice a day and as a result she has much better epithelialization on the left anterior calf area. Only a small open area remains. 01/29/18; the patient has been compliant with her compression pumps. Both the areas on her calf that healed. The remaining area on the left anterior leg is fully epithelialized Readmission: 02/20/2019 upon evaluation today patient presents for reevaluation  due to issues that she is having with the bilateral lower extremities. She actually has wounds open on both legs. On the right she has an area in the crease of her leg on the right around the knee region which is actually draining quite a bit and actually has some fungal type appearance to it. She has been on nystatin powder that seems to have helped to some degree. In regard to the left lower extremity this is actually in the lower portion of her leg closer to the ankle and again is continuing to drain as well unfortunately. There does not appear to be any signs of active infection at this time which is good news. No fevers, chills, nausea, vomiting, or diarrhea. She tells me that since she was seen last year she is actually been  doing quite well for the most part with regard to her lower extremities. Unfortunately she now is experiencing a little bit more drainage at this time. She is concerned about getting this under control so that it does not get significantly worse. 02/27/2019 on evaluation today patient appears to be doing somewhat better in regard to her bilateral lower extremity wounds. She has been tolerating the dressing changes without complication. Fortunately there is no signs of active infection at this point. No fevers, chills, nausea, vomiting, or diarrhea. She did get her dressing supplies which is excellent news she was extremely excited to get these. She also got paperwork from [employer] for their financial assistance program where they may be able to help her out in the future if needed with supplies at discounted prices. 03/06/2019 on evaluation today patient appears to be doing a little worse with regard to both areas of weeping on her bilateral lower extremities. This is around the right medial knee and just above the left ankle. With that being said she is unfortunately not doing as well as I would like to see. I feel like she may need to potentially go see someone at the  lymphedema clinic as the wraps that she needs or even beyond what we can do here at the wound care center. She really does not have wounds she just has open areas of weeping that are causing some difficulty for her. Subsequently because of this and the moisture I am concerned about the potential for infection I am going to likely give her a prophylactic antibiotic today, Keflex, just to be on the safe side. Nonetheless again there is no obvious signs of active infection at this time. MONISHA, SIEBEL ([id number]) 03/13/2019 on evaluation today patient appears to be doing well with regard to her bilateral lower extremities where she has been weeping compared to even last week's evaluation. I see some areas of new skin growth which is excellent and overall I am very pleased with how things seem to be progressing. No fevers, chills, nausea, vomiting, or diarrhea. 03/20/2019 on evaluation today patient unfortunately is continuing to have issues with significant edema of the left lower extremity. Her right side seems to be doing much better. Unfortunately her left side is showing increased weeping of the lower portion of her leg. This is quite unfortunate obviously we were hoping to get her into the lymphedema clinic they really do not seem to when I see her how if she is draining. Despite the fact this is really not wound related but more lymphedema weeping related. Nonetheless I do not know that this can be helpful for her to even go for that appointment since again I am not sure there is much that they would actually do at this point. We may need to try a 4 layer compression wrap as best we can on her leg. She is on the Augmentin currently although I am still concerned about whether or not there could be potentially something going on infection wise I would obtain a culture though I understand is not the best being that is a surface culture I just 1 to make sure I do not seem to be missing  anything. 03/27/2019 on evaluation today patient appears to be doing much better in regard to the left lower extremity compared to last week. Last week she had tremendous weeping which I think was subsequent to infection now she seems to be doing much better and very pleased. This is not completely  healed but there is a lot of new skin growth and it has dried out quite a bit. Overall I think that we are doing well with how things are moving along at this time. No fevers, chills, nausea, vomiting, or diarrhea. 04/03/2019 evaluation today patient appears to be doing a little worse this week compared to last time I saw her. I think this may be due to the fact that she is having issues with not being able to sleep in her bed at least not until last night. She is therefore been in a lift chair and subsequently has also had issues with not been able to use her pumps since she could not get in bed. With that being said the patient overall seems to be doing okay I do think I may want extend the antibiotic for a little bit longer at least until we can see if her edema and her weeping gets better and if it is then obviously I can always discontinue the antibiotics as of next week however I want her to continue to have it over the next week. 04/10/2019 on evaluation today patient unfortunately is still doing poorly with regard to her left lower extremity. Her right is all things considering doing fairly well. On the left however she continues to have spreading of the area of infection and weeping which appears to be even a larger surface area than noted last week. She did have a positive culture for Pseudomonas in particular which seems to have been of concern she still has green/yellow discharge consistent with Pseudomonas and subsequently a tremendous amount of it. This has me obviously still concerned about the infection not really clearing up despite the fact that on culture it appears the Cipro should have  been a good option for treating this. I think she may at this point need IV antibiotics since things are not doing better I do not want to get worse and cause sepsis. She is in agreement with the plan and believes as well that she likely does need to go to the hospital for IV vancomycin. Or something of the like depending on what the recommendation is from the ER. 04/17/2019 on evaluation today patient appears to be doing excellent in regard to her lower extremity on the left. She was in the hospital for several days from when I sent her last we saw her until just this past Tuesday. Fortunately her drainage is significantly improved and in fact is mostly clear. There is just a couple small areas that may still drain a little bit she states that the [facility] they prescribed for her at discharge she went picked up from pharmacy and got home but has not been able to find it since. She is looked everywhere. She is wondering if I will replace that for her today I will be more than happy to do that. 05/01/2019 on evaluation today patient actually appears to be doing quite well with regard to her lower extremities. She occasionally is having areas that will leak and then heal up mainly when a piece of the fibrotic skin pops off but fortunately she is not having any signs of active infection at this time. Overall she also really does not have any obvious weeping at this time. I do believe however she really needs some compression wraps and I think this may be a good time to get her back to the lymphedema clinic. 05/11/2019 on evaluation today patient actually appears to be doing quite well with  regard to her bilateral lower extremities. She occasionally will have a small area that we per another but in general seems to be completely healed which is great news. Overall very pleased with how everything seems to be progressing. She does have her appointment with lymphedema clinic on November 18. 05/25/2019 on  evaluation today patient appears to be doing well with regard to her left lower extremity. I am very pleased in this regard. In regard to her right leg this actually did start draining more I think it is mainly due to the fact that her leg is more swollen. I am not seeing any obvious signs of infection at this time although that is definitely something were obviously acutely aware of simply due to the fact that she had an issue not too far back with exactly this issue. Nonetheless I do feel like that lymphedema clinic would still be beneficial for her. I explained obviously if they are not able to do anything treatment wise on the right leg we could at least have them treat her left leg and then proceed from there. The patient is really in agreement with that plan. If they are able to do both as the drainage slows down that I would be happy to let them handle both. 06/01/2019 on evaluation today patient unfortunately appears to be doing worse with regard to her right lower extremity. The left lower extremity is still maintaining at this point. Unfortunately she has been having significantly increased pain over the past several days and has been experiencing as well increased swelling of the right lower extremity. I really do not know that I am seeing anything that appears to be obvious for infection at this point to be peripherally honest. With that being said the patient does seem to be having much more swelling that she is even experienced in the past and coupled with increased pain in her hip as well I am concerned that again she could potentially have a DVT although I am not 100% sure of this. I think it something that may need to be checked out. We discussed the possibility of sending her for a DVT study through the hospital but unfortunately transportation is an issue if she does have a DVT I do not want her to wait days to be able to get in for that test however if she has this scheduled as an  outpatient that is as fast that she will be able to get the test scheduled for transportation purposes. That will also fall on Thanksgiving so subsequently she did actually be looking at either Friday or even next week before we would know anything back from this. That is much too long in my opinion. Subsequent to the amount of discomfort she is experiencing the patient is actually okay with going to the ER for evaluation today. 06/12/2019 on evaluation today patient actually appears to be doing significantly better compared to last time I saw her. Following when I last saw her she was actually in the hospital from that Monday until the following Sunday almost 1 full week. She actually was placed on Keflex in the hospital following the time for her to be discharged and Dr. Steva Ready has recommended 2 times a day dosing of the Keflex for the next year in order to help with more prophylactic/preventative measures with regard to her developing cellulitis. Overall I think this sounds like an excellent plan. The patient unfortunately is good to have trouble being treated at lymphedema clinic due  to the fact that she really cannot get up on the bed that they have there. They also state that they cannot manage her as long as she has anything draining at this point. Obviously that is somewhat unfortunate as she does need help with edema control but nonetheless we will have to do what we can for her outside of it sounds like the lymphedema clinic scenario at this point. 06/19/2019 on evaluation today patient appears to be doing fairly well with regard to her bilateral lower extremities. She is not nearly as swollen [name], Kathlene C. ([id number]) and shows no signs of infection at this point. There is no evidence of cellulitis whatsoever. She also has no open wounds or draining at this point which is also good news. No fever chills noted. She seems to be in very good spirits and in fact appears to be doing quite  well. READMISSION 11/27/2019 This is a 65 year old woman that we have had in this clinic several times before including 2015, 16 and 19 and then most recently from 03/20/2019 through 06/19/2019 with bilateral lower extremity lymphedema. She has had previous arterial and reflux studies done years ago which were not all that remarkable. 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 She does have a positive family history and she had large legs starting may be in her 39s. She was recently in hospital from 10/20/2019 through 10/28/2019 with right leg cellulitis. She was given Ancef and clindamycin and then Zosyn when a culture showed Pseudomonas. At that time there was purulent drainage. She was followed by infectious disease Dr Steva Ready. The patient is now back at home. She has noted increased swelling in the right and no drainage in her right leg mostly on the posterior medial aspect in the calf area. She has not had pain or fever. She has literally been improved lysing above dressings because her at the area of this is far too large for standard compression. She has been wrapping the areas with sheets to resorptive pads. She is found these helped somewhat. She does have an appointment with the lymphedema clinic in [place] in late June. Past medical history includes bilateral lymphedema, hypertension, obstructive sleep apnea with CPAP. Recent hospitalization with apparently Pseudomonas cellulitis of the right lower leg 12/15/2019 upon evaluation today patient appears to be doing a little bit worse in regard to her right lower extremity. Unfortunately she is having more weeping down in the lower portion of her leg. Fortunately there is no signs of active infection at this time. No fever chills noted. The patient states she is not having increased pain except for when she attempted to use the lymphedema pumps unfortunately she states that she did  have pain when she did this. Otherwise we been using absorptive dressings of one type or another she is using diapers at home and then subsequently Ace wraps. In regard to the barrier cream we have discussed the possibility of derma cloud which she would like to try I do not have a problem with that. 12/22/2019 upon evaluation today patient actually appears to be doing better in regard to her leg ulcers at this point. Fortunately there does not appear to be any signs of active infection which is great news and I am extremely pleased with where things are progressing at this time. There is no sign of active infection currently. The patient is very pleased to see things doing so well. 12/29/2019 upon evaluation today patient  appears to be doing a little bit better in regard to her weeping in general over her lower extremities. She does have some signs of mild erythema little bit more than what I noted last week or rather last visit. Nonetheless I think that my threshold for switching her antibiotics from Keflex to something else is very low at this point considering that she has had such severe infections in the past that seem to come almost out of nowhere. There is a little erythema and warmth noted of the lower portion of her leg compared to the upper which also makes me want to go ahead and address things more rapidly at this point. Likely I would switch out the Keflex for something like Levaquin ideally. 7/16; patient with severe bilateral lymphedema. She has superficial wounds albeit almost circumferential now on the left lateral lower leg. This may be new from last time. Small area on the right anterior lower leg and then another area on the right medial lower leg and of pannus fold. She has been using various absorptive garments. She states she is using her compression pumps once a day occasionally twice. Culture from her last visit here was negative 01/29/2020 on evaluation today patient appears  to be doing excellent at this point in regard to her legs with regard to infection I see no signs of active infection at this point. She still does have unfortunately areas of weeping this is minimal on the right now her left is actually significantly worse although I do not think it is as bad as last week with Dr. Dellia Nims saw her. She has been trying to pump and elevate her legs is much as possible. She has previously been on the Keflex and in the past for prevention that seems to do fairly well and likely can extend that today. 02/04/2020 on evaluation today patient appears to be doing better in regard to her legs bilaterally. Fortunately there is no signs of active infection at this time which is great news and overall she has less weeping on the left compared to the right and there is several spots where she is pretty much sealed up with no draining regions. Overall very pleased in this regard. 02/19/2020 on evaluation today patient appears to be doing very well in regard to her wounds currently. Fortunately there is no evidence of active infection overall very pleased with where things stand. She is significantly improved in regard to her edema I am extremely pleased in this regard she tells me that the popping no longer hurts and in fact she actually looks forward to it. 03/04/2020 on evaluation today patient appears to be doing excellent in regard to her lower extremities. Fortunately there is no signs of active infection at this time. No fevers, chills, nausea, vomiting, or diarrhea. 03/25/2020 on evaluation today patient appears to be doing a little bit more poorly in regard to her legs at this point. She tells me that she is still continue to have issues with drainage and this has been a little bit worse she was getting ready to start taking the Keflex again but wanted to see me first. Fortunately there is no signs of active infection at this time. No fevers, chills, nausea, vomiting, or  diarrhea. 04/15/2020 upon evaluation today patient appears to be doing somewhat poorly in regard to her right leg. She tells me she has been having more pain she has been taking the Keflex that was previously prescribed unfortunately that just does not seem  to help with this. She was hoping that the pain on her right was actually coming from the fact that she was having issues with her wrap having gotten caught in her recliner. With that being said she tells me that she knew something was not right. Currently her right leg is warm to touch along with being erythematous all the way up to around at least mid thigh as far as I can see. The left leg does not appear to be doing that badly though there is increased weeping around the ankle region. 05/06/2020 on evaluation today patient appears to be doing much better than last time I saw her. She did go to the hospital where she was admitted for 2 days and treated with antibiotic therapy. She was discharged with antibiotics as well and has done extremely well. I am extremely pleased with where things stand today. There is no signs of active infection at this time which is great news. 05/27/2020 upon evaluation today patient appears to be doing well with regard to her lower extremities bilaterally. She has just a very tiny area on the right leg which is opening on the left leg she is significantly improved though she still has several areas that do appear to be open this is minimal compared to what is been in the past. In general I am extremely pleased with where things stand today. The patient does tell me she is not been using her pumps quite as much as she should be. I do believe that is 1 area she can definitely work on. She has had a lot going on including a Covid exposure and apparently also a outbreak of likely shingles. NAZIYAH, TIESZEN ([id number]) 06/24/2020 upon evaluation today patient appears to be doing well with regard to her legs in general  although the left leg unfortunately is showing some signs of erythema she does have a little bit of increased weeping and to be honest I am concerned about infection here. I discussed that with her today and I think that we may need to address this sooner rather than later she has been taking Keflex she is not really certain that is been making a big improvement however. No fevers, chills, nausea, vomiting, or diarrhea. 06/30/2020 upon evaluation today patient's legs actually seem to be doing better in my opinion as compared to where they were last week. Fortunately there does not appear to be any signs of active infection. Her culture showed multiple organisms nothing predominate. With that being said the Levaquin seems to have done well I think she has improved since I last saw her as well. 07/14/2020 upon evaluation today patient appears to be doing actually better in regard to her lower extremities in my opinion. She has been tolerating the dressing changes without complication. Fortunately there is no signs of active infection at this time. 08/04/2020 upon evaluation today patient appears to be doing excellent in regard to her leg ulcers. Fortunately she has very little that is open at this point. This is great news. In fact I think that the Goldbond medicated powder has been excellent for her. It seems to have done the trick where we had tried several other things without as much success. Fortunately there is no evidence of active infection at this time. No fevers, chills, nausea, vomiting, or diarrhea. 09/01/2020 upon evaluation today patient appears to be doing a little bit more poorly than the last time I saw her. She tells me right now that she has  been having a lot of drainage compared to where things were previous which has unfortunately led to more irritation as well. She is concerned that this is leading to infection. Again based on what I am seeing today as well I am also concerned of the  same to be honest. 09/15/2020 upon evaluation today patient appears to be doing well with regard to her legs compared to what I saw previous. Fortunately there does not appear to be any evidence of active infection at this time which is great news. At least not as badly as what it was previous. With that being said I do believe that the patient does need to have an extension of the antibiotics. This I believe will actually help her more in the way of making sure this stays under control and does not worsen. That was the Bactrim DS. Objective Constitutional Well-nourished and well-hydrated in no acute distress. Vitals Time Taken: 10:02 AM, Height: 63 in, Weight: 370 lbs, BMI: 65.5, Temperature: 98.0 F, Pulse: 73 bpm, Respiratory Rate: 22 breaths/min, Blood Pressure: 137/86 mmHg. Respiratory normal breathing without difficulty. Psychiatric this patient is able to make decisions and demonstrates good insight into disease process. Alert and Oriented x 3. pleasant and cooperative. General Notes: Upon inspection patient's wound bed actually showed signs of good granulation epithelization at this point. There does not appear to be any evidence of infection which is great news. No fevers, chills, nausea, vomiting, or diarrhea. I do think that I would extend the Bactrim for her though as we discussed during the office visit today. 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 JERICCA, RUSSETT ([id number]) Bathing/ Shower/ Hygiene: May shower; gently cleanse wound with antibacterial soap, rinse and pat dry prior to dressing wounds Edema Control - Lymphedema / Segmental Compressive Device / Other: Ace wraps - Chux secured with ace wraps Elevate,  Exercise Daily and Avoid Standing for Long Periods of Time. Compression Pump: Use compression pump on left lower extremity for 60 minutes, twice daily. Compression Pump: Use compression pump on right lower extremity for 60 minutes, twice daily. DO YOUR BEST to sleep in the bed at night. DO NOT sleep in your recliner. Long hours of sitting in a recliner leads to swelling of the legs and/or potential wounds on your backside. Non-Wound Condition: Additional non-wound orders/instructions: - Gold Bond Powder/Nystatin powder in clinic Medications-Please add to medication list.: Topical Antibiotic - bactrim x 14 days The following medication(s) was prescribed: Bactrim DS oral 800 mg-160 mg tablet 1 1 tablet oral taken 2 times per day for 14 days starting 09/15/2020 1. Would recommend currently that we go ahead and send in a refill of the Bactrim DS for the patient for additional 2 weeks just to make sure within completely clear she is in agreement with that plan. 2. I am also can recommend at this point that we have the patient go ahead and continue with the elevation of her legs and using her lymphedema pumps as much as she can although she is somewhat limited right now due to the fact that she has been having issues with her left knee. 3. I am also recommending patient should continue to monitor for any signs of worsening infection if that happens she should go to the ER as soon as  possible she knows what to look for which is excellent news. With that being said everything appears to be doing quite well at this point. We will see patient back for reevaluation in 2 weeks here in the clinic. If anything worsens or changes patient will contact our office for additional recommendations. Electronic Signature(s) Signed: 09/15/2020 11:06:01 AM By: Worthy Keeler PA-C Entered By: Worthy Keeler on 09/15/2020 11:06:01 Pledger, [name]  ([id number]) -------------------------------------------------------------------------------- SuperBill Details Patient Name: [name] Date of Service: 09/15/2020 Medical Record Number: [id number] Patient Account Number: [id number] Date of Birth/Sex: 02/18/1956 (65 y.o. F) Treating RN: Dolan Amen Primary Care Provider: Tandy Gaw Other Clinician: Jeanine Luz Referring Provider: Tandy Gaw Treating Provider/Extender: Skipper Cliche in Treatment: 81 Diagnosis Coding ICD-10 Codes Code Description Q82.0 Hereditary lymphedema L97.811 Non-pressure chronic ulcer of other part of right lower leg limited to breakdown of skin L97.821 Non-pressure chronic ulcer of other part of left lower leg limited to breakdown of skin Facility Procedures CPT4 Code: [id number] Description: [phone] - WOUND CARE VISIT-LEV 2 EST PT Modifier: Quantity: 1 Physician Procedures CPT4 Code: [id number] Description: 93903 - WC PHYS LEVEL 3 - EST PT Modifier: Quantity: 1 CPT4 Code: Description: ICD-10 Diagnosis Description Q82.0 Hereditary lymphedema L97.811 Non-pressure chronic ulcer of other part of right lower leg limited to bre L97.821 Non-pressure chronic ulcer of other part of left lower leg limited to brea Modifier: akdown of skin kdown of skin Quantity: Electronic Signature(s) Signed: 09/15/2020 11:06:14 AM By: Worthy Keeler PA-C Entered By: Worthy Keeler on 09/15/2020 11:06:14

## 2020-09-19 NOTE — Progress Notes (Addendum)
Elizabeth, [name] ([id number]) Visit Report for 09/15/2020 Arrival Information Details Patient Name: [name], [name]. Date of Service: 09/15/2020 10:00 AM Medical Record Number: [id number] Patient Account Number: [id number] Date of Birth/Sex: 1955/10/06 (65 y.o. F) Treating RN: Dolan Amen Primary Care Jaisa Defino: Tandy Gaw Other Clinician: Jeanine Luz Referring Maddyx Wieck: Tandy Gaw Treating Ronan Duecker/Extender: Skipper Cliche in Treatment: 45 Visit Information History Since Last Visit Added or deleted any medications: No Patient Arrived: [name] Had a fall or experienced change in No Arrival Time: 09:58 activities of daily living that may affect Accompanied By: self risk of falls: Transfer Assistance: None Hospitalized since last visit: No Patient Identification Verified: Yes Pain Present Now: Yes Secondary Verification Process Completed: Yes Patient Requires Transmission-Based Precautions: No Patient Has Alerts: No Electronic Signature(s) Signed: 09/19/2020 11:47:12 AM By: Jeanine Luz Entered By: Jeanine Luz on 09/15/2020 10:04:28 STEPHENSEllamae Sia ([id number]) -------------------------------------------------------------------------------- Clinic Level of Care Assessment Details Patient Name: [name]. Date of Service: 09/15/2020 10:00 AM Medical Record Number: [id number] Patient Account Number: [id number] Date of Birth/Sex: 13-Nov-1955 (65 y.o. F) Treating RN: Dolan Amen Primary Care Jerelyn Trimarco: Tandy Gaw Other Clinician: Jeanine Luz Referring Demarquis Osley: Tandy Gaw Treating Yasser Hepp/Extender: Skipper Cliche in Treatment: 67 Clinic Level of Care Assessment Items TOOL 4 Quantity Score X - Use when only an EandM is performed on FOLLOW-UP visit 1 0 ASSESSMENTS - Nursing Assessment / Reassessment X - Reassessment of Co-morbidities (includes updates in patient status) 1 10 X- 1 5 Reassessment of Adherence to Treatment Plan ASSESSMENTS -  Wound and Skin Assessment / Reassessment []  - Simple Wound Assessment / Reassessment - one wound 0 []  - 0 Complex Wound Assessment / Reassessment - multiple wounds []  - 0 Dermatologic / Skin Assessment (not related to wound area) ASSESSMENTS - Focused Assessment X - Circumferential Edema Measurements - multi extremities 1 5 []  - 0 Nutritional Assessment / Counseling / Intervention X- 1 5 Lower Extremity Assessment (monofilament, tuning fork, pulses) []  - 0 Peripheral Arterial Disease Assessment (using hand held doppler) ASSESSMENTS - Ostomy and/or Continence Assessment and Care []  - Incontinence Assessment and Management 0 []  - 0 Ostomy Care Assessment and Management (repouching, etc.) PROCESS - Coordination of Care X - Simple Patient / Family Education for ongoing care 1 15 []  - 0 Complex (extensive) Patient / Family Education for ongoing care []  - 0 Staff obtains Consents, Records, Test Results / Process Orders []  - 0 Staff telephones HHA, Nursing Homes / Clarify orders / etc []  - 0 Routine Transfer to another Facility (non-emergent condition) []  - 0 Routine Hospital Admission (non-emergent condition) []  - 0 New Admissions / Biomedical engineer / Ordering NPWT, Apligraf, etc. []  - 0 Emergency Hospital Admission (emergent condition) X- 1 10 Simple Discharge Coordination []  - 0 Complex (extensive) Discharge Coordination PROCESS - Special Needs []  - Pediatric / Minor Patient Management 0 []  - 0 Isolation Patient Management []  - 0 Hearing / Language / Visual special needs []  - 0 Assessment of Community assistance (transportation, D/C planning, etc.) []  - 0 Additional assistance / Altered mentation []  - 0 Support Surface(s) Assessment (bed, cushion, seat, etc.) INTERVENTIONS - Wound Cleansing / Measurement Radabaugh, Giah C. ([id number]) []  - 0 Simple Wound Cleansing - one wound []  - 0 Complex Wound Cleansing - multiple wounds []  - 0 Wound Imaging (photographs  - any number of wounds) []  - 0 Wound Tracing (instead of photographs) []  - 0 Simple Wound Measurement - one wound []  - 0 Complex Wound Measurement - multiple  wounds INTERVENTIONS - Wound Dressings []  - Small Wound Dressing one or multiple wounds 0 X- 1 15 Medium Wound Dressing one or multiple wounds []  - 0 Large Wound Dressing one or multiple wounds []  - 0 Application of Medications - topical []  - 0 Application of Medications - injection INTERVENTIONS - Miscellaneous []  - External ear exam 0 []  - 0 Specimen Collection (cultures, biopsies, blood, body fluids, etc.) []  - 0 Specimen(s) / Culture(s) sent or taken to Lab for analysis []  - 0 Patient Transfer (multiple staff / Civil Service fast streamer / Similar devices) []  - 0 Simple Staple / Suture removal (25 or less) []  - 0 Complex Staple / Suture removal (26 or more) []  - 0 Hypo / Hyperglycemic Management (close monitor of Blood Glucose) []  - 0 Ankle / Brachial Index (ABI) - do not check if billed separately X- 1 5 Vital Signs Has the patient been seen at the hospital within the last three years: Yes Total Score: 70 Level Of Care: New/Established - Level 2 Electronic Signature(s) Signed: 09/15/2020 4:13:11 PM By: Georges Mouse, Minus Breeding RN Entered By: Georges Mouse, Minus Breeding on 09/15/2020 10:36:21 [name] ([id number]) -------------------------------------------------------------------------------- Encounter Discharge Information Details Patient Name: [name]. Date of Service: 09/15/2020 10:00 AM Medical Record Number: [id number] Patient Account Number: [id number] Date of Birth/Sex: 05-Aug-1955 (65 y.o. F) Treating RN: Dolan Amen Primary Care Demico Ploch: Tandy Gaw Other Clinician: Jeanine Luz Referring Cornelious Diven: Tandy Gaw Treating Liviya Santini/Extender: Skipper Cliche in Treatment: 46 Encounter Discharge Information Items Discharge Condition: Stable Ambulatory Status: Cane Discharge Destination:  Home Transportation: Private Auto Accompanied By: self Schedule Follow-up Appointment: Yes Clinical Summary of Care: Electronic Signature(s) Signed: 09/15/2020 4:13:11 PM By: Georges Mouse, Minus Breeding RN Entered By: Georges Mouse, Minus Breeding on 09/15/2020 10:39:11 [name] ([id number]) -------------------------------------------------------------------------------- Lower Extremity Assessment Details Patient Name: HANNALEE, CASTOR C. Date of Service: 09/15/2020 10:00 AM Medical Record Number: [id number] Patient Account Number: [id number] Date of Birth/Sex: 1955/08/25 (65 y.o. F) Treating RN: Dolan Amen Primary Care Khiry Pasquariello: Tandy Gaw Other Clinician: Jeanine Luz Referring Zeynab Klett: Tandy Gaw Treating Idalis Hoelting/Extender: Skipper Cliche in Treatment: 41 Edema Assessment Assessed: [Left: No] [Right: No] [Left: Edema] [Right: :] Calf Left: Right: Point of Measurement: 32 cm From Medial Instep 78.5 cm 85 cm Ankle Left: Right: Point of Measurement: 9 cm From Medial Instep 48.2 cm 51 cm Vascular Assessment Pulses: Dorsalis Pedis Palpable: [Left:Yes] [Right:Yes] Electronic Signature(s) Signed: 09/15/2020 4:13:11 PM By: Georges Mouse, Minus Breeding RN Signed: 09/19/2020 11:47:12 AM By: Jeanine Luz Entered By: Jeanine Luz on 09/15/2020 10:16:49 Hecker, [name] ([id number]) -------------------------------------------------------------------------------- Multi Wound Chart Details Patient Name: [name] Date of Service: 09/15/2020 10:00 AM Medical Record Number: [id number] Patient Account Number: [id number] Date of Birth/Sex: [birth year] (65 y.o. F) Treating RN: Dolan Amen Primary Care Peniel Biel: Tandy Gaw Other Clinician: Jeanine Luz Referring Taite Baldassari: Tandy Gaw Treating Loyde Orth/Extender: Skipper Cliche in Treatment: 62 Vital Signs Height(in): 63 Pulse(bpm): 73 Weight(lbs): 370 Blood Pressure(mmHg): 137/86 Body Mass Index(BMI):  66 Temperature(F): 98.0 Respiratory Rate(breaths/min): 22 Wound Assessments Treatment Notes Electronic Signature(s) Signed: 09/15/2020 4:13:11 PM By: Georges Mouse, Minus Breeding RN Entered By: Georges Mouse, Minus Breeding on 09/15/2020 10:34:06 [name] ([id number]) -------------------------------------------------------------------------------- [place] Details Patient Name: [name]. Date of Service: 09/15/2020 10:00 AM Medical Record Number: [id number] Patient Account Number: [id number] Date of Birth/Sex: Aug 01, 1955 (65 y.o. F) Treating RN: Dolan Amen Primary Care Imunique Samad: Tandy Gaw Other Clinician: Jeanine Luz Referring Naleah Kofoed: Tandy Gaw Treating Samarah Hogle/Extender: Skipper Cliche in Treatment: [phone] Active  Inactive Electronic Signature(s) Signed: 11/01/2020 9:12:31 AM By: Gretta Cool, BSN, RN, CWS, Kim RN, BSN Signed: 11/08/2020 4:09:08 PM By: Charlett Nose RN Previous Signature: 09/15/2020 4:13:11 PM Version By: Georges Mouse, Minus Breeding RN Entered By: Gretta Cool, BSN, RN, CWS, Kim on 11/01/2020 09:12:30 Buis, [name] ([id number]) -------------------------------------------------------------------------------- Non-Wound Condition Assessment Details Patient Name: FELICIE, KOCHER. Date of Service: 09/15/2020 10:00 AM Medical Record Number: [id number] Patient Account Number: [id number] Date of Birth/Gender: 05-16-1956 (65 y.o. F) Treating RN: Dolan Amen Primary Care Physician: Tandy Gaw Other Clinician: Jeanine Luz Referring Physician: Tandy Gaw Treating Physician/Extender: Skipper Cliche in Treatment: 63 Non-Wound Condition: Condition: Lymphedema Location: Leg Side: Right Notes: Right leg with decreased drainage this visit. Photos Electronic Signature(s) Signed: 09/15/2020 4:13:11 PM By: Georges Mouse, Minus Breeding RN Signed: 09/19/2020 11:47:12 AM By: Jeanine Luz Entered By: Jeanine Luz on 09/15/2020  12:14:01 Suhr, [name] ([id number]) -------------------------------------------------------------------------------- Non-Wound Condition Assessment Details Patient Name: RENEKA, NEBERGALL C. Date of Service: 09/15/2020 10:00 AM Medical Record Number: [id number] Patient Account Number: [id number] Date of Birth/Gender: September 26, 1955 (65 y.o. F) Treating RN: Dolan Amen Primary Care Physician: Tandy Gaw Other Clinician: Jeanine Luz Referring Physician: Tandy Gaw Treating Physician/Extender: Skipper Cliche in Treatment: 70 Non-Wound Condition: Condition: Lymphedema Location: Leg Side: Left Notes: decreased drainage this visit. Photos Electronic Signature(s) Signed: 09/15/2020 4:13:11 PM By: Georges Mouse, Minus Breeding RN Signed: 09/19/2020 11:47:12 AM By: Jeanine Luz Entered By: Jeanine Luz on 09/15/2020 12:14:02 STEPHENSEllamae Sia ([id number]) -------------------------------------------------------------------------------- Pain Assessment Details Patient Name: SKAI, LICKTEIG C. Date of Service: 09/15/2020 10:00 AM Medical Record Number: [id number] Patient Account Number: [id number] Date of Birth/Sex: 25-Apr-1956 (65 y.o. F) Treating RN: Dolan Amen Primary Care Marlowe Lawes: Tandy Gaw Other Clinician: Jeanine Luz Referring Bonny Vanleeuwen: Tandy Gaw Treating Lesleyann Fichter/Extender: Skipper Cliche in Treatment: 54 Active Problems Location of Pain Severity and Description of Pain Patient Has Paino Yes Site Locations Rate the pain. Current Pain Level: 6 Pain Management and Medication Current Pain Management: Electronic Signature(s) Signed: 09/15/2020 4:13:11 PM By: Georges Mouse, Minus Breeding RN Signed: 09/19/2020 11:47:12 AM By: Jeanine Luz Entered By: Jeanine Luz on 09/15/2020 10:05:44 [name] ([id number]) -------------------------------------------------------------------------------- Patient/Caregiver Education Details Patient Name: [name] Date of Service: 09/15/2020 10:00 AM Medical Record Number: [id number] Patient Account Number: [id number] Date of Birth/Gender: 09/30/1955 (65 y.o. F) Treating RN: Dolan Amen Primary Care Physician: Tandy Gaw Other Clinician: Jeanine Luz Referring Physician: Tandy Gaw Treating Physician/Extender: Skipper Cliche in Treatment: 9 Education Assessment Education Provided To: Patient Education Topics Provided Wound/Skin Impairment: Methods: Explain/Verbal Responses: State content correctly Notes recommended seeing ortho for knee pain, s/sx of infection Electronic Signature(s) Signed: 09/15/2020 4:13:11 PM By: Georges Mouse, Minus Breeding RN Entered By: Georges Mouse, Minus Breeding on 09/15/2020 10:38:22 [name] ([id number]) -------------------------------------------------------------------------------- Vitals Details Patient Name: [name]. Date of Service: 09/15/2020 10:00 AM Medical Record Number: [id number] Patient Account Number: [id number] Date of Birth/Sex: 04/04/1956 (65 y.o. F) Treating RN: Dolan Amen Primary Care Suzi Hernan: Tandy Gaw Other Clinician: Jeanine Luz Referring Azizi Bally: Tandy Gaw Treating Welborn Keena/Extender: Skipper Cliche in Treatment: 71 Vital Signs Time Taken: 10:02 Temperature (F): 98.0 Height (in): 63 Pulse (bpm): 73 Weight (lbs): 370 Respiratory Rate (breaths/min): 22 Body Mass Index (BMI): 65.5 Blood Pressure (mmHg): 137/86 Reference Range: 80 - 120 mg / dl Electronic Signature(s) Signed: 09/19/2020 11:47:12 AM By: Jeanine Luz Entered By: Jeanine Luz on 09/15/2020 10:04:51

## 2020-09-24 ENCOUNTER — Emergency Department: Payer: Medicare HMO

## 2020-09-24 ENCOUNTER — Other Ambulatory Visit: Payer: Self-pay

## 2020-09-24 ENCOUNTER — Inpatient Hospital Stay
Admission: EM | Admit: 2020-09-24 | Discharge: 2020-09-29 | DRG: 871 | Disposition: A | Discharge status: Home Health | Payer: Medicare HMO | Attending: Internal Medicine | Admitting: Internal Medicine

## 2020-09-24 DIAGNOSIS — N179 Acute kidney failure, unspecified: Secondary | ICD-10-CM

## 2020-09-24 DIAGNOSIS — Z66 Do not resuscitate: Secondary | ICD-10-CM | POA: Diagnosis not present

## 2020-09-24 DIAGNOSIS — I10 Essential (primary) hypertension: Secondary | ICD-10-CM | POA: Diagnosis present

## 2020-09-24 DIAGNOSIS — E8770 Fluid overload, unspecified: Secondary | ICD-10-CM

## 2020-09-24 DIAGNOSIS — E872 Acidosis, unspecified: Secondary | ICD-10-CM

## 2020-09-24 DIAGNOSIS — Z6841 Body Mass Index (BMI) 40.0 and over, adult: Secondary | ICD-10-CM | POA: Diagnosis not present

## 2020-09-24 DIAGNOSIS — J45909 Unspecified asthma, uncomplicated: Secondary | ICD-10-CM | POA: Diagnosis present

## 2020-09-24 DIAGNOSIS — Z79899 Other long term (current) drug therapy: Secondary | ICD-10-CM | POA: Diagnosis not present

## 2020-09-24 DIAGNOSIS — J811 Chronic pulmonary edema: Secondary | ICD-10-CM | POA: Diagnosis present

## 2020-09-24 DIAGNOSIS — Z888 Allergy status to other drugs, medicaments and biological substances status: Secondary | ICD-10-CM | POA: Diagnosis not present

## 2020-09-24 DIAGNOSIS — Z881 Allergy status to other antibiotic agents status: Secondary | ICD-10-CM

## 2020-09-24 DIAGNOSIS — L03115 Cellulitis of right lower limb: Secondary | ICD-10-CM | POA: Diagnosis present

## 2020-09-24 DIAGNOSIS — G4733 Obstructive sleep apnea (adult) (pediatric): Secondary | ICD-10-CM | POA: Diagnosis present

## 2020-09-24 DIAGNOSIS — Z720 Tobacco use: Secondary | ICD-10-CM

## 2020-09-24 DIAGNOSIS — Z20822 Contact with and (suspected) exposure to covid-19: Secondary | ICD-10-CM | POA: Diagnosis present

## 2020-09-24 DIAGNOSIS — D649 Anemia, unspecified: Secondary | ICD-10-CM | POA: Diagnosis present

## 2020-09-24 DIAGNOSIS — J9601 Acute respiratory failure with hypoxia: Secondary | ICD-10-CM | POA: Diagnosis present

## 2020-09-24 DIAGNOSIS — A419 Sepsis, unspecified organism: Principal | ICD-10-CM | POA: Diagnosis present

## 2020-09-24 DIAGNOSIS — L03116 Cellulitis of left lower limb: Secondary | ICD-10-CM | POA: Diagnosis present

## 2020-09-24 DIAGNOSIS — R6520 Severe sepsis without septic shock: Secondary | ICD-10-CM | POA: Diagnosis present

## 2020-09-24 DIAGNOSIS — I890 Lymphedema, not elsewhere classified: Secondary | ICD-10-CM

## 2020-09-24 DIAGNOSIS — L0390 Cellulitis, unspecified: Secondary | ICD-10-CM

## 2020-09-24 LAB — URINALYSIS, COMPLETE (UACMP) WITH MICROSCOPIC
Bacteria, UA: NONE SEEN
Bilirubin Urine: NEGATIVE
Glucose, UA: NEGATIVE mg/dL
Hgb urine dipstick: NEGATIVE
Ketones, ur: NEGATIVE mg/dL
Leukocytes,Ua: NEGATIVE
Nitrite: NEGATIVE
Protein, ur: NEGATIVE mg/dL
Specific Gravity, Urine: 1.02 (ref 1.005–1.030)
pH: 5 (ref 5.0–8.0)

## 2020-09-24 LAB — CBC WITH DIFFERENTIAL/PLATELET
Abs Immature Granulocytes: 0.15 10*3/uL — ABNORMAL HIGH (ref 0.00–0.07)
Basophils Absolute: 0 10*3/uL (ref 0.0–0.1)
Basophils Relative: 0 %
Eosinophils Absolute: 0 10*3/uL (ref 0.0–0.5)
Eosinophils Relative: 0 %
HCT: 40.3 % (ref 36.0–46.0)
Hemoglobin: 12.7 g/dL (ref 12.0–15.0)
Immature Granulocytes: 1 %
Lymphocytes Relative: 3 %
Lymphs Abs: 0.5 10*3/uL — ABNORMAL LOW (ref 0.7–4.0)
MCH: 26.2 pg (ref 26.0–34.0)
MCHC: 31.5 g/dL (ref 30.0–36.0)
MCV: 83.1 fL (ref 80.0–100.0)
Monocytes Absolute: 0.7 10*3/uL (ref 0.1–1.0)
Monocytes Relative: 5 %
Neutro Abs: 13.7 10*3/uL — ABNORMAL HIGH (ref 1.7–7.7)
Neutrophils Relative %: 91 %
Platelets: 165 10*3/uL (ref 150–400)
RBC: 4.85 MIL/uL (ref 3.87–5.11)
RDW: 14.6 % (ref 11.5–15.5)
WBC: 15.1 10*3/uL — ABNORMAL HIGH (ref 4.0–10.5)
nRBC: 0 % (ref 0.0–0.2)

## 2020-09-24 LAB — RESP PANEL BY RT-PCR (FLU A&B, COVID) ARPGX2
Influenza A by PCR: NEGATIVE
Influenza B by PCR: NEGATIVE
SARS Coronavirus 2 by RT PCR: NEGATIVE

## 2020-09-24 LAB — COMPREHENSIVE METABOLIC PANEL
ALT: 12 U/L (ref 0–44)
AST: 21 U/L (ref 15–41)
Albumin: 3.7 g/dL (ref 3.5–5.0)
Alkaline Phosphatase: 117 U/L (ref 38–126)
Anion gap: 9 (ref 5–15)
BUN: 21 mg/dL (ref 8–23)
CO2: 24 mmol/L (ref 22–32)
Calcium: 9 mg/dL (ref 8.9–10.3)
Chloride: 102 mmol/L (ref 98–111)
Creatinine, Ser: 1.2 mg/dL — ABNORMAL HIGH (ref 0.44–1.00)
GFR, Estimated: 50 mL/min — ABNORMAL LOW (ref >60)
Glucose, Bld: 99 mg/dL (ref 70–99)
Potassium: 4.7 mmol/L (ref 3.5–5.1)
Sodium: 135 mmol/L (ref 135–145)
Total Bilirubin: 0.9 mg/dL (ref 0.3–1.2)
Total Protein: 9.1 g/dL — ABNORMAL HIGH (ref 6.5–8.1)

## 2020-09-24 LAB — BRAIN NATRIURETIC PEPTIDE: B Natriuretic Peptide: 120.4 pg/mL — ABNORMAL HIGH (ref 0.0–100.0)

## 2020-09-24 LAB — PROTIME-INR
INR: 1.2 (ref 0.8–1.2)
Prothrombin Time: 14.3 seconds (ref 11.4–15.2)

## 2020-09-24 LAB — LACTIC ACID, PLASMA
Lactic Acid, Venous: 1.8 mmol/L (ref 0.5–1.9)
Lactic Acid, Venous: 2.2 mmol/L (ref 0.5–1.9)
Lactic Acid, Venous: 1.4 mmol/L (ref 0.5–1.9)

## 2020-09-24 MED ORDER — ONDANSETRON HCL 4 MG PO TABS: 4.0000 mg | ORAL_TABLET | Freq: Four times a day (QID) | ORAL | Status: DC | PRN

## 2020-09-24 MED ORDER — SODIUM CHLORIDE 0.9 % IV SOLN
2.0000 g | Freq: Once | INTRAVENOUS | Status: AC
  Administered 2020-09-24: 2 g via INTRAVENOUS
  Filled 2020-09-24: qty 2

## 2020-09-24 MED ORDER — TRAZODONE HCL 50 MG PO TABS
50.0000 mg | ORAL_TABLET | Freq: Every evening | ORAL | Status: DC | PRN
  Administered 2020-09-24 – 2020-09-25 (×2): 50 mg via ORAL
  Filled 2020-09-24 (×2): qty 1

## 2020-09-24 MED ORDER — ACETAMINOPHEN 650 MG RE SUPP: 650.0000 mg | Freq: Four times a day (QID) | RECTAL | Status: DC | PRN

## 2020-09-24 MED ORDER — ONDANSETRON HCL 4 MG/2ML IJ SOLN: 4.0000 mg | Freq: Four times a day (QID) | INTRAMUSCULAR | Status: DC | PRN

## 2020-09-24 MED ORDER — ENOXAPARIN SODIUM 40 MG/0.4ML ~~LOC~~ SOLN
40.0000 mg | SUBCUTANEOUS | Status: DC
  Administered 2020-09-24: 40 mg via SUBCUTANEOUS
  Filled 2020-09-24: qty 0.4

## 2020-09-24 MED ORDER — SODIUM CHLORIDE 0.9 % IV SOLN
3.0000 g | Freq: Four times a day (QID) | INTRAVENOUS | Status: DC
  Administered 2020-09-24 – 2020-09-25 (×3): 3 g via INTRAVENOUS
  Filled 2020-09-24 (×5): qty 8

## 2020-09-24 MED ORDER — LACTATED RINGERS IV BOLUS: 500.0000 mL | Freq: Once | INTRAVENOUS | Status: DC

## 2020-09-24 MED ORDER — ACETAMINOPHEN 325 MG PO TABS
650.0000 mg | ORAL_TABLET | Freq: Four times a day (QID) | ORAL | Status: DC | PRN
  Administered 2020-09-25: 650 mg via ORAL
  Filled 2020-09-24: qty 2

## 2020-09-24 MED ORDER — LACTATED RINGERS IV BOLUS
1000.0000 mL | Freq: Once | INTRAVENOUS | Status: AC
  Administered 2020-09-24: 1000 mL via INTRAVENOUS

## 2020-09-24 MED ORDER — ATENOLOL 25 MG PO TABS
50.0000 mg | ORAL_TABLET | Freq: Every day | ORAL | Status: DC
  Administered 2020-09-24: 50 mg via ORAL
  Filled 2020-09-24 (×2): qty 2

## 2020-09-24 MED ORDER — POLYETHYLENE GLYCOL 3350 17 G PO PACK: 17.0000 g | PACK | Freq: Every day | ORAL | Status: DC | PRN

## 2020-09-24 MED ORDER — CLINDAMYCIN PHOSPHATE 600 MG/50ML IV SOLN
600.0000 mg | Freq: Once | INTRAVENOUS | Status: DC
  Filled 2020-09-24: qty 50

## 2020-09-24 MED ORDER — SODIUM CHLORIDE 0.9 % IV SOLN
100.0000 mg | Freq: Two times a day (BID) | INTRAVENOUS | Status: DC
  Administered 2020-09-25 – 2020-09-27 (×5): 100 mg via INTRAVENOUS
  Filled 2020-09-24 (×7): qty 100

## 2020-09-24 MED ORDER — OXYCODONE HCL 5 MG PO TABS
5.0000 mg | ORAL_TABLET | ORAL | Status: DC | PRN
  Administered 2020-09-24 – 2020-09-27 (×6): 5 mg via ORAL
  Filled 2020-09-24 (×7): qty 1

## 2020-09-24 MED ORDER — SODIUM CHLORIDE 0.9 % IV SOLN: 3.0000 g | Freq: Once | INTRAVENOUS | Status: DC

## 2020-09-24 NOTE — ED Notes (Signed)
Recollect BNP sent to lab. INT attempted x2 for second line. This RN unsuccessful. Patient updated on POC.

## 2020-09-24 NOTE — Progress Notes (Signed)
Pharmacy Antibiotic Note  [name] is a 65 y.o. female admitted on 09/24/2020 with cellulitis of legs.  Pharmacy has been consulted for Unasyn dosing.  WBC elevated at 15.1, LA 2.2, pt febrile to 102.3  Plan: Ampicillin-sulbactam 3g IV q6h Monitor clinical picture, renal function F/U C&S, abx de-escalation, LOT  Height: 5\' 3"  (160 cm) Weight: (!) 167.4 kg (369 lb) IBW/kg (Calculated) : 52.4  Temp (24hrs), Avg:102.3 F (39.1 C), Min:102.3 F (39.1 C), Max:102.3 F (39.1 C)  Recent Labs  Lab 09/24/20 1457 09/24/20 1625  WBC 15.1*  --   CREATININE 1.20*  --   LATICACIDVEN 1.8 2.2*    Estimated Creatinine Clearance: 72.6 mL/min (A) (by C-G formula based on SCr of 1.2 mg/dL (H)).    Allergies  Allergen Reactions  . Vancomycin Itching and Nausea And Vomiting  . Ace Inhibitors Hives and Swelling  . Ibuprofen Hives and Swelling    Antimicrobials this admission: Unasyn 3/19> Cefepime 3/19 x1 Clindamycin 3/19> Doxycycline 3/19>   Microbiology results: 3/19 BCx: sent  Thank you for allowing pharmacy to be a part of this patient's care.  Mills Koller 09/24/2020 5:17 PM

## 2020-09-24 NOTE — ED Notes (Signed)
Dressings from BLE removed. Clean pad placed under weaping extremities. Patient denies pain to BLE.

## 2020-09-24 NOTE — ED Notes (Signed)
Pt with O2 sats 94-95 % on room air, sats drop to 88% when pt moved to bed. Pt placed on O2 3 L [place], sats improved to 95% on 3 L [place]

## 2020-09-24 NOTE — Progress Notes (Signed)
elink monitoring sepsis 

## 2020-09-24 NOTE — ED Triage Notes (Signed)
Pt from home, pt with recurring cellulitis on left and right legs, under care at the wound care clinic for 3 weeks, woke up this am with a fever, nausea and vomiting. Pt with ace wraps around both legs due to lymphedema.

## 2020-09-24 NOTE — ED Provider Notes (Signed)
[facility] Emergency Department Provider Note  ____________________________________________   Event Date/Time   First MD Initiated Contact with Patient 09/24/20 1514     (approximate)  I have reviewed the triage vital signs and the nursing notes.   HISTORY  Chief Complaint Fever and Cellulitis    HPI [name] is a 65 y.o. female with lymphedema who comes in for fever and concern for cellulitis.  Patient was already given Tylenol 1 g for a fever of 102.9.  Patient reports having some nausea and vomiting and body aches.  Patient states that she has been on Bactrim for over 15 days for cellulitis of her bilateral legs.  Denies of being any more swollen than normal.  But there has been some redness and warmth with some minimal pain that is constant, nothing makes it better, nothing makes it worse.  Patient has been wrapping her legs daily.  She states that she just woke up this morning and felt all the symptoms.  She reports having her Covid vaccines but not her booster.  Denies being around anybody with Covid.  In triage when patient ambulated her oxygen levels desatted therefore patient was placed on 3 L.  Denies any abdominal tenderness.  Did not know that her oxygen levels were low and inability denied any shortness of breath          Past Medical History:  Diagnosis Date  . Arthritis   . Asthma   . Enlarged heart   . Hypertension   . Lymphedema   . Sleep apnea   . Spinal stenosis     Patient Active Problem List   Diagnosis Date Noted  . Bilateral lower leg cellulitis 04/15/2020  . Anemia   . Cellulitis of left lower extremity 11/03/2018  . Sepsis ([place]) 04/17/2018  . Chest pain with high risk for cardiac etiology 02/04/2017  . Heart palpitations 02/04/2017  . SOB (shortness of breath) on exertion 02/04/2017  . OSA (obstructive sleep apnea) 08/04/2016  . HTN (hypertension) 08/04/2016  . Asthma 08/04/2016  . Screening for colon  cancer 05/17/2015  . Cellulitis 01/24/2015  . Cellulitis of right leg 01/23/2015  . Skin rash 12/28/2013  . Tobacco use 12/28/2013  . Spinal stenosis 07/11/2011  . Cataracts, bilateral 05/30/2010  . History of colonic polyps 12/28/2009  . Lymphedema 02/10/2009  . Idiopathic urticaria 02/10/2009  . Morbid obesity ([place]) 02/10/2009  . Other abnormal glucose 02/10/2009  . Essential (primary) hypertension 02/10/2009    Past Surgical History:  Procedure Laterality Date  . COLONOSCOPY WITH PROPOFOL N/A 03/12/2018   Procedure: COLONOSCOPY WITH PROPOFOL;  Surgeon: Manya Silvas, MD;  Location[facility] ENDOSCOPY;  Service: Endoscopy;  Laterality: N/A;  . NO PAST SURGERIES      Prior to Admission medications   Medication Sig Start Date End Date Taking? Authorizing Provider  acetaminophen (TYLENOL) 500 MG tablet Take 500-1,000 mg by mouth every 6 (six) hours as needed for mild pain or fever.    [provider]  acidophilus (RISAQUAD) CAPS capsule Take 1 capsule by mouth daily.    [provider]  atenolol (TENORMIN) 50 MG tablet Take 50 mg by mouth daily. 02/29/20   [provider]  cetirizine (ZYRTEC) 10 MG tablet Take 10 mg by mouth daily as needed for allergies.    [provider]  furosemide (LASIX) 20 MG tablet Take 1 tablet (20 mg total) by mouth daily. 06/07/19   Loletha Grayer, MD  Infant Care Products (  DERMACLOUD) CREA Apply 1 application topically daily. 12/21/19   [provider]  Infant Care Products (DERMACLOUD) CREA Apply 1 application topically daily. 12/21/19   [provider]  NYSTATIN powder Apply 1 application topically 3 (three) times daily. 01/07/20   [provider]    Allergies Vancomycin, Ace inhibitors, and Ibuprofen  Family History  Problem Relation Age of Onset  . Thyroid disease Other   . Breast cancer Maternal Aunt     Social History Social History   Tobacco Use  . Smoking status: Former Smoker     Packs/day: 0.25    Start date: 07/09/1976    Quit date: 07/09/1990    Years since quitting: 30.2  . Smokeless tobacco: Current User    Types: Snuff  Vaping Use  . Vaping Use: Never used  Substance Use Topics  . Alcohol use: No  . Drug use: No      Review of Systems Constitutional: Positive fevers, chills, body aches Eyes: No visual changes. ENT: No sore throat. Cardiovascular: Denies chest pain. Respiratory: Denies shortness of breath. Gastrointestinal: No abdominal pain.  No nausea, no vomiting.  No diarrhea.  No constipation. Genitourinary: Negative for dysuria. Musculoskeletal: Negative for back pain. Skin: Positive for rash Neurological: Negative for headaches, focal weakness or numbness. All other ROS negative ____________________________________________   PHYSICAL EXAM:  VITAL SIGNS: ED Triage Vitals  Enc Vitals Group     BP 09/24/20 1425 (!) 193/93     Pulse Rate 09/24/20 1425 (!) 109     Resp 09/24/20 1425 20     Temp 09/24/20 1425 (!) 102.3 F (39.1 C)     Temp Source 09/24/20 1425 Oral     SpO2 09/24/20 1425 96 %     Weight 09/24/20 1433 (!) 369 lb (167.4 kg)     Height 09/24/20 1433 5\' 3"  (1.6 m)     Head Circumference --      Peak Flow --      Pain Score 09/24/20 1433 9     Pain Loc --      Pain Edu? --      Excl. in [place]? --     Constitutional: Alert and oriented. Well appearing and in no acute distress. Eyes: Conjunctivae are normal. EOMI. Head: Atraumatic. Nose: No congestion/rhinnorhea. Mouth/Throat: Mucous membranes are moist.   Neck: No stridor. Trachea Midline. FROM Cardiovascular: Tachycardic, regular rhythm. Grossly normal heart sounds.  Good peripheral circulation. Respiratory: Normal respiratory effort.  No retractions. Lungs CTAB.  On 3 L of oxygen Gastrointestinal: Soft and nontender. No distention. No abdominal bruits.  Musculoskeletal: Significant lymphedema bilaterally with warmth redness on bilateral legs.  2+ distal  pulse Neurologic:  Normal speech and language. No gross focal neurologic deficits are appreciated.  Skin:  Skin is warm, dry and intact. No rash noted. Psychiatric: Mood and affect are normal. Speech and behavior are normal. GU: Deferred   ____________________________________________   LABS (all labs ordered are listed, but only abnormal results are displayed)  Labs Reviewed  COMPREHENSIVE METABOLIC PANEL - Abnormal; Notable for the following components:      Result Value   Creatinine, Ser 1.20 (*)    Total Protein 9.1 (*)    GFR, Estimated 50 (*)    All other components within normal limits  CBC WITH DIFFERENTIAL/PLATELET - Abnormal; Notable for the following components:   WBC 15.1 (*)    Neutro Abs 13.7 (*)    Lymphs Abs 0.5 (*)    Abs Immature Granulocytes  0.15 (*)    All other components within normal limits  URINALYSIS, COMPLETE (UACMP) WITH MICROSCOPIC - Abnormal; Notable for the following components:   Color, Urine YELLOW (*)    APPearance HAZY (*)    All other components within normal limits  CULTURE, BLOOD (ROUTINE X 2)  CULTURE, BLOOD (ROUTINE X 2)  RESP PANEL BY RT-PCR (FLU A&B, COVID) ARPGX2  LACTIC ACID, PLASMA  PROTIME-INR  LACTIC ACID, PLASMA  BRAIN NATRIURETIC PEPTIDE   ____________________________________________   ED ECG REPORT I, Vanessa [place], the attending physician, personally viewed and interpreted this ECG.  Normal sinus rate of 97, no ST elevation, no T wave inversions, normal intervals ____________________________________________  RADIOLOGY Robert Bellow, personally viewed and evaluated these images (plain radiographs) as part of my medical decision making, as well as reviewing the written report by the radiologist.  ED MD interpretation: Pulmonary edema bilaterally  Official radiology report(s): DG Chest Port 1 View  Result Date: 09/24/2020 CLINICAL DATA:  Fluid excess. Additional history provided: Recurring cellulitis on legs, fever,  nausea, vomiting. EXAM: PORTABLE CHEST 1 VIEW COMPARISON:  Prior chest radiographs 04/12/2019 and earlier. FINDINGS: Heart size within normal limits. Central pulmonary vascular congestion. Additionally, there is prominent of the interstitial lung markings bilaterally likely reflecting interstitial edema. No sizable pleural effusion or evidence of pneumothorax. No evidence of acute bony abnormality. IMPRESSION: Central pulmonary vascular congestion. Associated prominence of the interstitial lung markings bilaterally likely reflecting interstitial edema. Electronically Signed   By: Kellie Simmering DO   On: 09/24/2020 15:35    ____________________________________________   PROCEDURES  Procedure(s) performed (including Critical Care):  .Critical Care Performed by: Vanessa [place], MD Authorized by: Vanessa [place], MD   Critical care provider statement:    Critical care time (minutes):  45   Critical care was necessary to treat or prevent imminent or life-threatening deterioration of the following conditions:  Sepsis and respiratory failure   Critical care was time spent personally by me on the following activities:  Discussions with consultants, evaluation of patient's response to treatment, examination of patient, ordering and performing treatments and interventions, ordering and review of laboratory studies, ordering and review of radiographic studies, pulse oximetry, re-evaluation of patient's condition, obtaining history from patient or surrogate and review of old charts .1-3 Lead EKG Interpretation Performed by: Vanessa [place], MD Authorized by: Vanessa [place], MD     Interpretation: normal     ECG rate:  90-110s    ECG rate assessment: normal     Ectopy: none     Conduction: normal   Comments:     Occasionally between normal sinus and sinus tachycardia     ____________________________________________   INITIAL IMPRESSION / ASSESSMENT AND PLAN / ED COURSE  [name] was  evaluated in Emergency Department on 09/24/2020 for the symptoms described in the history of present illness. She was evaluated in the context of the global COVID-19 pandemic, which necessitated consideration that the patient might be at risk for infection with the SARS-CoV-2 virus that causes COVID-19. Institutional protocols and algorithms that pertain to the evaluation of patients at risk for COVID-19 are in a state of rapid change based on information released by regulatory bodies including the CDC and federal and state organizations. These policies and algorithms were followed during the patient's care in the ED.    Patient is a 65 year old female upon my evaluation meet sepsis criteria with tachycardia and elevated temperature.  Septic work-up was initiated and  septic code was called.  Patient most likely septic secondary to cellulitis.  Patient failed outpatient Bactrim.  Reviewed patient's prior hospital stays and she is typically treated with Unasyn plus doxy or Unasyn plus Clinda.  Patient has an allergy to vancomycin.  However given patient is septic I think we should do stronger antibiotics and we will start off with cefepime and use clindamycin to cover for MRSA.  Patient also hypoxic and requiring 3 L and there is pulmonary edema on her chest x-ray.  We will hold off on fluid resuscitation at this time.  Patient has had fluid previously when requiring admission for sepsis and cellulitis.  At this time I have lower suspicion for pneumonia but will get Covid swab.  No abdominal tenderness suggest intra-abdominal infection.  4:09 PM lactate is normal.  I reassess patient I do not feel that patient needs pressors at this time.  Patient remains hemodynamically stable        ____________________________________________   FINAL CLINICAL IMPRESSION(S) / ED DIAGNOSES   Final diagnoses:  Acute respiratory failure with hypoxia (HCC)  Sepsis, due to unspecified organism, unspecified whether acute  organ dysfunction present ([place])  Cellulitis, unspecified cellulitis site      MEDICATIONS GIVEN DURING THIS VISIT:  Medications  clindamycin (CLEOCIN) IVPB 600 mg (has no administration in time range)  ceFEPIme (MAXIPIME) 2 g in sodium chloride 0.9 % 100 mL IVPB (has no administration in time range)     ED Discharge Orders    None       Note:  This document was prepared using Dragon voice recognition software and may include unintentional dictation errors.   Vanessa [place], MD 09/24/20 [id number]

## 2020-09-24 NOTE — ED Notes (Signed)
Patient transported to floor.

## 2020-09-24 NOTE — Progress Notes (Signed)
Notified provider of need to order repeat lactic acid.  Second lactic resulted 2.2, which is higher than previous lactic of 1.8, secure chat sent to MD asking if he would consider ordering a 3rd lactic. Md replied he will order a 3rd lactic

## 2020-09-24 NOTE — ED Notes (Signed)
Transport requested for patient.  

## 2020-09-24 NOTE — Progress Notes (Signed)
CODE SEPSIS - PHARMACY COMMUNICATION  **Broad Spectrum Antibiotics should be administered within 1 hour of Sepsis diagnosis**  Time Code Sepsis Called/Page Received: 1543  Antibiotics Ordered: cefepime/clindamycin  Time of 1st antibiotic administration: 1632  Additional action taken by pharmacy: none  If necessary, Name of Provider/Nurse Contacted: [place] ,PharmD Clinical Pharmacist  09/24/2020  4:14 PM

## 2020-09-24 NOTE — ED Triage Notes (Signed)
Pt in via EMS from home. Pt with fever of 102.9 and has been seeing the wound clinic for a year. EMS gave pt 975 of tylenol

## 2020-09-24 NOTE — H&P (Signed)
[facility] History and Physical  [name] HBZ:[id number] DOB: [birth year] DOA: 09/24/2020  Referring physician: Dr. Jari Pigg PCP: Ranae Plumber, [place]   Chief Complaint: leg pain  HPI: [name] is a 65 y.o. female with hx of lymphedema and recurrent lower extremity cellulitis, OSA, hypertension, asthma, morbid obesity, who presents with lower extremity pain concern for cellulitis.  Patient reports she has had longstanding lymphedema as well as recurrent bouts of cellulitis in her shins.  She reports that she started having problems about 2 weeks ago and had been prescribed Bactrim which she had been taking consistently without any missed doses.  Earlier today she felt very ill, she has had sepsis before and reported that she felt exactly the same as the last time she had gotten sepsis from a skin infection so she decided to present to the ED.  No other symptoms besides a general feeling of unwellness.  In the ED vital signs notable for fever to 102.3 Fahrenheit and tachycardia to the low 100s, as well as hypertension.  Initially comfortable on room air but with ambulation desaturated and was placed on 3 L nasal cannula.  Exam of lower extremities was consistent with bilateral cellulitis.  Lab work-up notable for CMP with AKI creatinine at 1.2, but otherwise unremarkable.  CBC notable for white count of 15 but otherwise unremarkable.  Lactic acid was normal.  Covid and influenza test were negative.  Chest x-ray showed signs of pulmonary edema.  Blood cultures were obtained.  Due to allergy to vancomycin, patient was started on combination of cefepime and clindamycin and admitted for further management of her sepsis due to lower extremity cellulitis.  Review of chart shows she has been admitted multiple times over the past several years for the same issue. Prior antibiotic regimens include Ancef/Clinda/Zosyn, Unasyn/Doxycycline, Ancef alone, Cefepime alone, Vanc/Ceftriaxone, Vanc  alone.   Review of Systems:  Pertinent positives and negative per HPI, all others reviewed and negative  Past Medical History:  Diagnosis Date  . Arthritis   . Asthma   . Enlarged heart   . Hypertension   . Lymphedema   . Sleep apnea   . Spinal stenosis    Past Surgical History:  Procedure Laterality Date  . COLONOSCOPY WITH PROPOFOL N/A 03/12/2018   Procedure: COLONOSCOPY WITH PROPOFOL;  Surgeon: Manya Silvas, MD;  Location: [facility] ENDOSCOPY;  Service: Endoscopy;  Laterality: N/A;  . NO PAST SURGERIES     Social History:  reports that she quit smoking about 30 years ago. She started smoking about 44 years ago. She smoked 0.25 packs per day. Her smokeless tobacco use includes snuff. She reports that she does not drink alcohol and does not use drugs.  Allergies  Allergen Reactions  . Vancomycin Itching and Nausea And Vomiting  . Ace Inhibitors Hives and Swelling  . Ibuprofen Hives and Swelling    Family History  Problem Relation Age of Onset  . Thyroid disease Other   . Breast cancer Maternal Aunt      Prior to Admission medications   Medication Sig Start Date End Date Taking? Authorizing Provider  acetaminophen (TYLENOL) 500 MG tablet Take 500-1,000 mg by mouth every 6 (six) hours as needed for mild pain or fever.    [provider]  acidophilus (RISAQUAD) CAPS capsule Take 1 capsule by mouth daily.    [provider]  atenolol (TENORMIN) 50 MG tablet Take 50 mg by mouth daily. 02/29/20   [provider]  cetirizine Alethia Berthold)  10 MG tablet Take 10 mg by mouth daily as needed for allergies.    [provider]  furosemide (LASIX) 20 MG tablet Take 1 tablet (20 mg total) by mouth daily. 06/07/19   Loletha Grayer, MD  Infant Care Products [facility]) CREA Apply 1 application topically daily. 12/21/19   [provider]  Infant Care Products (DERMACLOUD) CREA Apply 1 application topically daily. 12/21/19   [provider]   NYSTATIN powder Apply 1 application topically 3 (three) times daily. 01/07/20   [provider]   Physical Exam: Vitals:   09/24/20 1521 09/24/20 1600 09/24/20 1636 09/24/20 1728  BP: (!) 154/66 (!) 153/77 (!) 153/77 (!) 143/87  Pulse: 95 96 90 91  Resp: 20 16 16 16   Temp:    99.5 F (37.5 C)  TempSrc:    Oral  SpO2: 95% 95% 96% 94%  Weight:      Height:        Wt Readings from Last 3 Encounters:  09/24/20 (!) 167.4 kg  04/15/20 (!) 167.8 kg  10/27/19 (!) 165.4 kg     . General:  Appears calm and comfortable . Eyes: PERRL, normal lids, irises & conjunctiva . ENT: grossly normal hearing, lips & tongue . Neck: no masses . Cardiovascular: RRR, no m/r/g. Massive LE lymphedema . Telemetry: SR, no arrhythmias  . Respiratory: CTA bilaterally, no w/r/r. Normal respiratory effort. . Abdomen: soft, ntnd . Skin: see picture below . Musculoskeletal: grossly normal tone BUE/BLE . Psychiatric: grossly normal mood and affect, speech fluent and appropriate . Neurologic: grossly non-focal.              Labs on Admission:  Basic Metabolic Panel: Recent Labs  Lab 09/24/20 1457  NA 135  K 4.7  CL 102  CO2 24  GLUCOSE 99  BUN 21  CREATININE 1.20*  CALCIUM 9.0   Liver Function Tests: Recent Labs  Lab 09/24/20 1457  AST 21  ALT 12  ALKPHOS 117  BILITOT 0.9  PROT 9.1*  ALBUMIN 3.7   No results for input(s): LIPASE, AMYLASE in the last 168 hours. No results for input(s): AMMONIA in the last 168 hours. CBC: Recent Labs  Lab 09/24/20 1457  WBC 15.1*  NEUTROABS 13.7*  HGB 12.7  HCT 40.3  MCV 83.1  PLT 165   Cardiac Enzymes: No results for input(s): CKTOTAL, CKMB, CKMBINDEX, TROPONINI in the last 168 hours.  BNP (last 3 results) Recent Labs    04/15/20 1256 09/24/20 1457  BNP 77.1 120.4*    ProBNP (last 3 results) No results for input(s): PROBNP in the last 8760 hours.  CBG: No results for input(s): GLUCAP in the last 168  hours.  Radiological Exams on Admission: DG Chest Port 1 View  Result Date: 09/24/2020 CLINICAL DATA:  Fluid excess. Additional history provided: Recurring cellulitis on legs, fever, nausea, vomiting. EXAM: PORTABLE CHEST 1 VIEW COMPARISON:  Prior chest radiographs 04/12/2019 and earlier. FINDINGS: Heart size within normal limits. Central pulmonary vascular congestion. Additionally, there is prominent of the interstitial lung markings bilaterally likely reflecting interstitial edema. No sizable pleural effusion or evidence of pneumothorax. No evidence of acute bony abnormality. IMPRESSION: Central pulmonary vascular congestion. Associated prominence of the interstitial lung markings bilaterally likely reflecting interstitial edema. Electronically Signed   By: Kellie Simmering DO   On: 09/24/2020 15:35    EKG: Independently reviewed.  Sinus rhythm, nonspecific T wave changes in 1 and aVL.  Compared to prior T wave inversion in aVL is  less prominent otherwise no changes.  Assessment/Plan Active Problems:   Lymphedema   OSA (obstructive sleep apnea)   HTN (hypertension)   Asthma   Sepsis ([place])   Morbid obesity (HCC)   Bilateral lower leg cellulitis   #Sepsis #Bilateral lower extremity cellulitis #Lactic acidosis Patient presenting with recurrent lower extremity cellulitis secondary to her lymphedema.  Patient has had this problem many times before with varying antibiotic combinations used to treat it.  Has currently failed an outpatient regimen of Bactrim, received cefepime and Clinda on arrival to the ED, will give most recent effective narrower combination of Unasyn and doxycycline and see how she does.  Also has mild lactic acidosis will fluid resuscitate. -Unasyn per pharmacy consult -Doxy 100 mg twice daily -Status post 1 L LR bolus, will give another 1 L bolus  #AKI Mild, baseline Cr 0.8-0.9 and is currently 1.2, will give gently fluid bolus and reassess BMP.   #Known medical  problems Hypertension-continue atenolol Lower extremity edema/lymphedema-hold Lasix in setting of AKI  Code Status: Full Code DVT Prophylaxis: Lovenox Family Communication: none Disposition Plan: Inpatient, Med-Surg   Time spent: 4 min  Clarnce Flock MD/[facility]  Note:  This document was prepared using Systems analyst and may include unintentional dictation errors.

## 2020-09-24 NOTE — ED Notes (Signed)
Secure chat sent to inpatient RN for report.

## 2020-09-25 DIAGNOSIS — E872 Acidosis, unspecified: Secondary | ICD-10-CM

## 2020-09-25 DIAGNOSIS — A419 Sepsis, unspecified organism: Principal | ICD-10-CM

## 2020-09-25 DIAGNOSIS — Z6841 Body Mass Index (BMI) 40.0 and over, adult: Secondary | ICD-10-CM

## 2020-09-25 DIAGNOSIS — G4733 Obstructive sleep apnea (adult) (pediatric): Secondary | ICD-10-CM

## 2020-09-25 DIAGNOSIS — R6520 Severe sepsis without septic shock: Secondary | ICD-10-CM

## 2020-09-25 DIAGNOSIS — N179 Acute kidney failure, unspecified: Secondary | ICD-10-CM

## 2020-09-25 LAB — CBC
HCT: 31.9 % — ABNORMAL LOW (ref 36.0–46.0)
Hemoglobin: 10 g/dL — ABNORMAL LOW (ref 12.0–15.0)
MCH: 26.4 pg (ref 26.0–34.0)
MCHC: 31.3 g/dL (ref 30.0–36.0)
MCV: 84.2 fL (ref 80.0–100.0)
Platelets: 144 10*3/uL — ABNORMAL LOW (ref 150–400)
RBC: 3.79 MIL/uL — ABNORMAL LOW (ref 3.87–5.11)
RDW: 14.7 % (ref 11.5–15.5)
WBC: 12.4 10*3/uL — ABNORMAL HIGH (ref 4.0–10.5)
nRBC: 0 % (ref 0.0–0.2)

## 2020-09-25 LAB — COMPREHENSIVE METABOLIC PANEL
ALT: 10 U/L (ref 0–44)
AST: 14 U/L — ABNORMAL LOW (ref 15–41)
Albumin: 2.7 g/dL — ABNORMAL LOW (ref 3.5–5.0)
Alkaline Phosphatase: 82 U/L (ref 38–126)
Anion gap: 7 (ref 5–15)
BUN: 19 mg/dL (ref 8–23)
CO2: 24 mmol/L (ref 22–32)
Calcium: 8.2 mg/dL — ABNORMAL LOW (ref 8.9–10.3)
Chloride: 103 mmol/L (ref 98–111)
Creatinine, Ser: 1.18 mg/dL — ABNORMAL HIGH (ref 0.44–1.00)
GFR, Estimated: 51 mL/min — ABNORMAL LOW (ref >60)
Glucose, Bld: 95 mg/dL (ref 70–99)
Potassium: 4.1 mmol/L (ref 3.5–5.1)
Sodium: 134 mmol/L — ABNORMAL LOW (ref 135–145)
Total Bilirubin: 0.8 mg/dL (ref 0.3–1.2)
Total Protein: 6.9 g/dL (ref 6.5–8.1)

## 2020-09-25 MED ORDER — ENOXAPARIN SODIUM 100 MG/ML ~~LOC~~ SOLN
0.5000 mg/kg | Freq: Every day | SUBCUTANEOUS | Status: DC
  Administered 2020-09-25 – 2020-09-28 (×3): 82.5 mg via SUBCUTANEOUS
  Filled 2020-09-25 (×5): qty 1

## 2020-09-25 MED ORDER — SODIUM CHLORIDE 0.9 % IV SOLN
2.0000 g | Freq: Every day | INTRAVENOUS | Status: DC
  Administered 2020-09-25 – 2020-09-27 (×3): 2 g via INTRAVENOUS
  Filled 2020-09-25: qty 20
  Filled 2020-09-25 (×2): qty 2
  Filled 2020-09-25: qty 20

## 2020-09-25 MED ORDER — FUROSEMIDE 40 MG PO TABS
40.0000 mg | ORAL_TABLET | Freq: Every day | ORAL | Status: DC
Start: 2020-09-25 — End: 2020-09-29
  Administered 2020-09-25 – 2020-09-29 (×5): 40 mg via ORAL
  Filled 2020-09-25 (×5): qty 1

## 2020-09-25 MED ORDER — ATENOLOL 25 MG PO TABS
12.5000 mg | ORAL_TABLET | Freq: Every day | ORAL | Status: DC
  Administered 2020-09-26 – 2020-09-29 (×4): 12.5 mg via ORAL
  Filled 2020-09-25 (×4): qty 1

## 2020-09-25 NOTE — Progress Notes (Signed)
PHARMACIST - PHYSICIAN COMMUNICATION  CONCERNING:  Enoxaparin (Lovenox) for DVT Prophylaxis    RECOMMENDATION: Patient was prescribed enoxaparin 40mg  q24 hours for VTE prophylaxis.   Filed Weights   09/24/20 1433  Weight: (!) 167.4 kg (369 lb)    Body mass index is 65.37 kg/m.  Estimated Creatinine Clearance: 73.8 mL/min (A) (by C-G formula based on SCr of 1.18 mg/dL (H)).   Based on [place] patient is candidate for enoxaparin 0.5mg /kg TBW SQ every 24 hours based on BMI being >30.  DESCRIPTION: Pharmacy has adjusted enoxaparin dose per [facility] policy.  Patient is now receiving enoxaparin 82.5 mg every 24 hours    Benita Gutter 09/25/2020 7:18 AM

## 2020-09-25 NOTE — [place] Notes (Signed)
Patient ID: [name], female   DOB: 30-May-1956, 65 y.o.   MRN: [id number] [facility] PROGRESS NOTE  [name] [id number] DOB: 1955/09/25 DOA: 09/24/2020 PCP: Ranae Plumber, PA  HPI/Subjective: Patient stated that she had shaking chills and was cold.  Also had a fever.  Feels a little bit better today than yesterday.  Her joints were hurting yesterday.  She normally takes Lasix on a daily basis.  Objective: Vitals:   09/25/20 0913 09/25/20 1200  BP: (!) 109/53   Pulse: 69 (!) 58  Resp: 18 16  Temp: 98.2 F (36.8 C) 98.2 F (36.8 C)  SpO2: 95% 97%    Intake/Output Summary (Last 24 hours) at 09/25/2020 1347 Last data filed at 09/25/2020 1019 Gross per 24 hour  Intake 320 ml  Output --  Net 320 ml   Filed Weights   09/24/20 1433  Weight: (!) 167.4 kg    ROS: Review of Systems  Respiratory: Negative for shortness of breath.   Cardiovascular: Negative for chest pain.  Gastrointestinal: Negative for abdominal pain, nausea and vomiting.  Musculoskeletal: Positive for joint pain.   Exam: Physical Exam HENT:     Head: Normocephalic.  Eyes:     General: Lids are normal.     Conjunctiva/sclera: Conjunctivae normal.  Cardiovascular:     Rate and Rhythm: Normal rate and regular rhythm.     Heart sounds: Normal heart sounds, S1 normal and S2 normal.  Pulmonary:     Breath sounds: Examination of the right-lower field reveals decreased breath sounds. Examination of the left-lower field reveals decreased breath sounds. Decreased breath sounds present. No wheezing, rhonchi or rales.  Abdominal:     Palpations: Abdomen is soft.     Tenderness: There is no abdominal tenderness.  Musculoskeletal:     Right upper leg: Swelling present.     Left upper leg: Swelling present.     Right ankle: Swelling present.     Left ankle: Swelling present.  Skin:    General: Skin is warm.     Comments: Erythema right thigh.  Increased warmth.  Chronic erythema  bilateral lower extremity lymphedema.  Neurological:     Mental Status: She is alert and oriented to person, place, and time.       Data Reviewed: Basic Metabolic Panel: Recent Labs  Lab 09/24/20 1457 09/25/20 0555  NA 135 134*  K 4.7 4.1  CL 102 103  CO2 24 24  GLUCOSE 99 95  BUN 21 19  CREATININE 1.20* 1.18*  CALCIUM 9.0 8.2*   Liver Function Tests: Recent Labs  Lab 09/24/20 1457 09/25/20 0555  AST 21 14*  ALT 12 10  ALKPHOS 117 82  BILITOT 0.9 0.8  PROT 9.1* 6.9  ALBUMIN 3.7 2.7*   CBC: Recent Labs  Lab 09/24/20 1457 09/25/20 0555  WBC 15.1* 12.4*  NEUTROABS 13.7*  --   HGB 12.7 10.0*  HCT 40.3 31.9*  MCV 83.1 84.2  PLT 165 144*   CBNP (last 3 results) Recent Labs    04/15/20 1256 09/24/20 1457  BNP 77.1 120.4*      Recent Results (from the past 240 hour(s))  Culture, blood (Routine x 2)     Status: None (Preliminary result)   Collection Time: 09/24/20  2:57 PM   Specimen: BLOOD  Result Value Ref Range Status   Specimen Description BLOOD BLOOD RIGHT HAND  Final   Special Requests   Final    BOTTLES DRAWN AEROBIC AND ANAEROBIC Blood  Culture results may not be optimal due to an inadequate volume of blood received in culture bottles   Culture   Final    NO GROWTH < 24 HOURS Performed at [facility], [address] [place], [place] [address]    Report Status PENDING  Incomplete  Culture, blood (Routine x 2)     Status: None (Preliminary result)   Collection Time: 09/24/20  2:57 PM   Specimen: BLOOD  Result Value Ref Range Status   Specimen Description BLOOD BLOOD RIGHT FOREARM  Final   Special Requests   Final    BOTTLES DRAWN AEROBIC AND ANAEROBIC Blood Culture results may not be optimal due to an inadequate volume of blood received in culture bottles   Culture   Final    NO GROWTH < 24 HOURS Performed at [facility], [address] [place], [place] [address]    Report Status PENDING  Incomplete  Resp Panel by  RT-PCR (Flu A&B, Covid) Nasopharyngeal Swab     Status: None   Collection Time: 09/24/20  2:57 PM   Specimen: Nasopharyngeal Swab; Nasopharyngeal(NP) swabs in vial transport medium  Result Value Ref Range Status   SARS Coronavirus 2 by RT PCR NEGATIVE NEGATIVE Final    Comment: (NOTE) SARS-CoV-2 target nucleic acids are NOT DETECTED.  The SARS-CoV-2 RNA is generally detectable in upper respiratory specimens during the acute phase of infection. The lowest concentration of SARS-CoV-2 viral copies this assay can detect is 138 copies/mL. A negative result does not preclude SARS-Cov-2 infection and should not be used as the sole basis for treatment or other patient management decisions. A negative result may occur with  improper specimen collection/handling, submission of specimen other than nasopharyngeal swab, presence of viral mutation(s) within the areas targeted by this assay, and inadequate number of viral copies(<138 copies/mL). A negative result must be combined with clinical observations, patient history, and epidemiological information. The expected result is Negative.  Fact Sheet for Patients:  EntrepreneurPulse.com.au  Fact Sheet for Healthcare Providers:  IncredibleEmployment.be  This test is no t yet approved or cleared by the [place] FDA and  has been authorized for detection and/or diagnosis of SARS-CoV-2 by FDA under an Emergency Use Authorization (EUA). This EUA will remain  in effect (meaning this test can be used) for the duration of the COVID-19 declaration under Section 564(b)(1) of the Act, 21 U.S.C.section 360bbb-3(b)(1), unless the authorization is terminated  or revoked sooner.       Influenza A by PCR NEGATIVE NEGATIVE Final   Influenza B by PCR NEGATIVE NEGATIVE Final    Comment: (NOTE) The Xpert Xpress SARS-CoV-2/FLU/RSV plus assay is intended as an aid in the diagnosis of influenza from Nasopharyngeal swab  specimens and should not be used as a sole basis for treatment. Nasal washings and aspirates are unacceptable for Xpert Xpress SARS-CoV-2/FLU/RSV testing.  Fact Sheet for Patients: EntrepreneurPulse.com.au  Fact Sheet for Healthcare Providers: IncredibleEmployment.be  This test is not yet approved or cleared by the [place] FDA and has been authorized for detection and/or diagnosis of SARS-CoV-2 by FDA under an Emergency Use Authorization (EUA). This EUA will remain in effect (meaning this test can be used) for the duration of the COVID-19 declaration under Section 564(b)(1) of the Act, 21 U.S.C. section 360bbb-3(b)(1), unless the authorization is terminated or revoked.  Performed at [facility], [address], [place], [place] [address]      Studies: [facility] Chest [place] 1 View  Result Date: 09/24/2020  CLINICAL DATA:  Fluid excess. Additional history provided: Recurring cellulitis on legs, fever, nausea, vomiting. EXAM: PORTABLE CHEST 1 VIEW COMPARISON:  Prior chest radiographs 04/12/2019 and earlier. FINDINGS: Heart size within normal limits. Central pulmonary vascular congestion. Additionally, there is prominent of the interstitial lung markings bilaterally likely reflecting interstitial edema. No sizable pleural effusion or evidence of pneumothorax. No evidence of acute bony abnormality. IMPRESSION: Central pulmonary vascular congestion. Associated prominence of the interstitial lung markings bilaterally likely reflecting interstitial edema. Electronically Signed   By: Kellie Simmering DO   On: 09/24/2020 15:35    Scheduled Meds: . atenolol  50 mg Oral Daily  . enoxaparin (LOVENOX) injection  0.5 mg/kg Subcutaneous Q2200  . furosemide  40 mg Oral Daily   Continuous Infusions: . cefTRIAXone (ROCEPHIN)  IV    . doxycycline (VIBRAMYCIN) IV 100 mg (09/25/20 0549)    Assessment/Plan:  1. Clinical sepsis, present on admission.  Patient  had fever and tachycardia and right thigh cellulitis, chronic lower extremity erythema.  Admitting physician ordered Unasyn and doxycycline.  We will switch Unasyn over the Rocephin.  Patient has an allergy to vancomycin.  Patient failed outpatient treatment with Bactrim. 2. Lactic acidosis. 3. Chronic lymphedema, shortness of breath restart Lasix daily 4. Acute kidney injury.  Baseline creatinine 0.88.  Creatinine 1.2 on presentation.  Continue to monitor closely 5. Morbid obesity with a BMI of 65.37 6. Essential hypertension decreased dose of atenolol with blood pressure being on the lower side    Code Status:     Code Status Orders  (From admission, onward)         Start     Ordered   09/24/20 2004  Full code  Continuous        09/24/20 2003        Code Status History    Date Active Date Inactive Code Status Order ID Comments User Context   04/15/2020 1705 04/17/2020 2101 Full Code [id number]  Sharen Hones, MD ED   10/20/2019 1959 10/29/2019 0020 Full Code [id number]  Sharen Hones, MD ED   06/01/2019 2152 06/07/2019 2017 Full Code [id number]  Orene Desanctis, DO ED   04/10/2019 1325 04/14/2019 2024 Full Code [id number]  Bettey Costa, MD ED   11/03/2018 1715 11/05/2018 1956 Full Code [id number]  Mayo, Pete Pelt, MD Inpatient   04/17/2018 0905 04/19/2018 1649 Full Code [id number]  Arta Silence, MD Inpatient   08/04/2016 0246 08/07/2016 1731 Full Code [id number]  Lance Coon, MD Inpatient   01/23/2015 2114 01/28/2015 1717 Full Code [id number]  Hower, Aaron Mose, MD ED   Advance Care Planning Activity     Family Communication: Deferred Disposition Plan: Status is: Inpatient  Dispo: The patient is from: Home              Anticipated d/c is to: Home              Patient currently on IV antibiotics for cellulitis   Difficult to place patient.  No.  Time spent: 28 minutes  [place]

## 2020-09-25 NOTE — Plan of Care (Incomplete)
?  Problem: Nutrition: ?Goal: Adequate nutrition will be maintained ?Outcome: Progressing ?  ?Problem: Coping: ?Goal: Level of anxiety will decrease ?Outcome: Progressing ?  ?Problem: Elimination: ?Goal: Will not experience complications related to urinary retention ?Outcome: Progressing ?  ?

## 2020-09-26 DIAGNOSIS — I10 Essential (primary) hypertension: Secondary | ICD-10-CM

## 2020-09-26 LAB — BASIC METABOLIC PANEL
Anion gap: 7 (ref 5–15)
BUN: 19 mg/dL (ref 8–23)
CO2: 24 mmol/L (ref 22–32)
Calcium: 8.2 mg/dL — ABNORMAL LOW (ref 8.9–10.3)
Chloride: 104 mmol/L (ref 98–111)
Creatinine, Ser: 1.05 mg/dL — ABNORMAL HIGH (ref 0.44–1.00)
GFR, Estimated: 59 mL/min — ABNORMAL LOW (ref >60)
Glucose, Bld: 102 mg/dL — ABNORMAL HIGH (ref 70–99)
Potassium: 4 mmol/L (ref 3.5–5.1)
Sodium: 135 mmol/L (ref 135–145)

## 2020-09-26 LAB — CBC
HCT: 32.3 % — ABNORMAL LOW (ref 36.0–46.0)
Hemoglobin: 9.8 g/dL — ABNORMAL LOW (ref 12.0–15.0)
MCH: 25.8 pg — ABNORMAL LOW (ref 26.0–34.0)
MCHC: 30.3 g/dL (ref 30.0–36.0)
MCV: 85 fL (ref 80.0–100.0)
Platelets: 144 10*3/uL — ABNORMAL LOW (ref 150–400)
RBC: 3.8 MIL/uL — ABNORMAL LOW (ref 3.87–5.11)
RDW: 14.9 % (ref 11.5–15.5)
WBC: 7.2 10*3/uL (ref 4.0–10.5)
nRBC: 0 % (ref 0.0–0.2)

## 2020-09-26 MED ORDER — ALBUTEROL SULFATE HFA 108 (90 BASE) MCG/ACT IN AERS
2.0000 | INHALATION_SPRAY | Freq: Four times a day (QID) | RESPIRATORY_TRACT | Status: DC | PRN
  Administered 2020-09-26: 2 via RESPIRATORY_TRACT
  Filled 2020-09-26: qty 6.7

## 2020-09-26 NOTE — Plan of Care (Signed)
  Problem: Education: Goal: Knowledge of General Education information will improve Description: Including pain rating scale, medication(s)/side effects and non-pharmacologic comfort measures Outcome: Progressing   Problem: Clinical Measurements: Goal: Ability to maintain clinical measurements within normal limits will improve Outcome: Progressing Goal: Will remain free from infection Outcome: Progressing Goal: Diagnostic test results will improve Outcome: Progressing Goal: Respiratory complications will improve Outcome: Progressing Goal: Cardiovascular complication will be avoided Outcome: Progressing   Problem: Coping: Goal: Level of anxiety will decrease Outcome: Progressing   Problem: Elimination: Goal: Will not experience complications related to bowel motility Outcome: Progressing Goal: Will not experience complications related to urinary retention Outcome: Progressing   Problem: Pain Managment: Goal: General experience of comfort will improve Outcome: Progressing   Problem: Safety: Goal: Ability to remain free from injury will improve Outcome: Progressing   Problem: Skin Integrity: Goal: Risk for impaired skin integrity will decrease Outcome: Progressing   

## 2020-09-26 NOTE — [place] Notes (Signed)
Patient ID: [name], female   DOB: [birth year], 65 y.o.   MRN: [id number] [facility] PROGRESS NOTE  [name] [id number] DOB: Jun 18, 1956 DOA: 09/24/2020 PCP: Ranae Plumber, PA  HPI/Subjective: Feeling okay.  She wanted me to look in the back of her right leg.  Her right thigh is less hot and red than previously.  Patient has chronic lymphedema.  Admitted with cellulitis.  Objective: Vitals:   09/26/20 1226 09/26/20 1552  BP: 110/65 108/64  Pulse: (!) 59 63  Resp: 16 15  Temp: 97.9 F (36.6 C) 98.6 F (37 C)  SpO2: 97% 98%    Intake/Output Summary (Last 24 hours) at 09/26/2020 1717 Last data filed at 09/26/2020 1354 Gross per 24 hour  Intake 917 ml  Output 2350 ml  Net -1433 ml   Filed Weights   09/24/20 1433  Weight: (!) 167.4 kg    ROS: Review of Systems  Respiratory: Positive for wheezing.   Cardiovascular: Negative for chest pain.  Gastrointestinal: Negative for abdominal pain, nausea and vomiting.   Exam: Physical Exam HENT:     Head: Normocephalic.  Eyes:     General: Lids are normal.     Conjunctiva/sclera: Conjunctivae normal.  Cardiovascular:     Rate and Rhythm: Normal rate and regular rhythm.     Heart sounds: Normal heart sounds, S1 normal and S2 normal.  Pulmonary:     Breath sounds: Examination of the right-lower field reveals decreased breath sounds. Examination of the left-lower field reveals decreased breath sounds. Decreased breath sounds present. No wheezing, rhonchi or rales.  Abdominal:     Palpations: Abdomen is soft.     Tenderness: There is no abdominal tenderness.  Musculoskeletal:     Right upper leg: Swelling present.     Left upper leg: Swelling present.     Right lower leg: Swelling present.     Left lower leg: Swelling present.     Right ankle: Swelling present.     Left ankle: Swelling present.  Skin:    General: Skin is warm.     Comments: Right thigh anteriorly redness starting to fade and a  little less warm. Right thigh posteriorly dark erythema and warmth. Bilateral chronic lower extremity skin discoloration and redness with chronic lymphedema.  Neurological:     Mental Status: She is alert and oriented to person, place, and time.       Data Reviewed: Basic Metabolic Panel: Recent Labs  Lab 09/24/20 1457 09/25/20 0555 09/26/20 0442  NA 135 134* 135  K 4.7 4.1 4.0  CL 102 103 104  CO2 24 24 24   GLUCOSE 99 95 102*  BUN 21 19 19   CREATININE 1.20* 1.18* 1.05*  CALCIUM 9.0 8.2* 8.2*   Liver Function Tests: Recent Labs  Lab 09/24/20 1457 09/25/20 0555  AST 21 14*  ALT 12 10  ALKPHOS 117 82  BILITOT 0.9 0.8  PROT 9.1* 6.9  ALBUMIN 3.7 2.7*   CBC: Recent Labs  Lab 09/24/20 1457 09/25/20 0555 09/26/20 0442  WBC 15.1* 12.4* 7.2  NEUTROABS 13.7*  --   --   HGB 12.7 10.0* 9.8*  HCT 40.3 31.9* 32.3*  MCV 83.1 84.2 85.0  PLT 165 144* 144*   BNP (last 3 results) Recent Labs    04/15/20 1256 09/24/20 1457  BNP 77.1 120.4*     Recent Results (from the past 240 hour(s))  Culture, blood (Routine x 2)     Status: None (Preliminary result)  Collection Time: 09/24/20  2:57 PM   Specimen: BLOOD  Result Value Ref Range Status   Specimen Description BLOOD BLOOD RIGHT HAND  Final   Special Requests   Final    BOTTLES DRAWN AEROBIC AND ANAEROBIC Blood Culture results may not be optimal due to an inadequate volume of blood received in culture bottles   Culture   Final    NO GROWTH 2 DAYS Performed at [facility], [address], [place], [place] [address]    Report Status PENDING  Incomplete  Culture, blood (Routine x 2)     Status: None (Preliminary result)   Collection Time: 09/24/20  2:57 PM   Specimen: BLOOD  Result Value Ref Range Status   Specimen Description BLOOD BLOOD RIGHT FOREARM  Final   Special Requests   Final    BOTTLES DRAWN AEROBIC AND ANAEROBIC Blood Culture results may not be optimal due to an inadequate volume of blood  received in culture bottles   Culture   Final    NO GROWTH 2 DAYS Performed at [facility], [address], [place], [place] [address]    Report Status PENDING  Incomplete  Resp Panel by RT-PCR (Flu A&B, Covid) Nasopharyngeal Swab     Status: None   Collection Time: 09/24/20  2:57 PM   Specimen: Nasopharyngeal Swab; Nasopharyngeal(NP) swabs in vial transport medium  Result Value Ref Range Status   SARS Coronavirus 2 by RT PCR NEGATIVE NEGATIVE Final    Comment: (NOTE) SARS-CoV-2 target nucleic acids are NOT DETECTED.  The SARS-CoV-2 RNA is generally detectable in upper respiratory specimens during the acute phase of infection. The lowest concentration of SARS-CoV-2 viral copies this assay can detect is 138 copies/mL. A negative result does not preclude SARS-Cov-2 infection and should not be used as the sole basis for treatment or other patient management decisions. A negative result may occur with  improper specimen collection/handling, submission of specimen other than nasopharyngeal swab, presence of viral mutation(s) within the areas targeted by this assay, and inadequate number of viral copies(<138 copies/mL). A negative result must be combined with clinical observations, patient history, and epidemiological information. The expected result is Negative.  Fact Sheet for Patients:  EntrepreneurPulse.com.au  Fact Sheet for Healthcare Providers:  IncredibleEmployment.be  This test is no t yet approved or cleared by the [place] FDA and  has been authorized for detection and/or diagnosis of SARS-CoV-2 by FDA under an Emergency Use Authorization (EUA). This EUA will remain  in effect (meaning this test can be used) for the duration of the COVID-19 declaration under Section 564(b)(1) of the Act, 21 U.S.C.section 360bbb-3(b)(1), unless the authorization is terminated  or revoked sooner.       Influenza A by PCR NEGATIVE  NEGATIVE Final   Influenza B by PCR NEGATIVE NEGATIVE Final    Comment: (NOTE) The Xpert Xpress SARS-CoV-2/FLU/RSV plus assay is intended as an aid in the diagnosis of influenza from Nasopharyngeal swab specimens and should not be used as a sole basis for treatment. Nasal washings and aspirates are unacceptable for Xpert Xpress SARS-CoV-2/FLU/RSV testing.  Fact Sheet for Patients: EntrepreneurPulse.com.au  Fact Sheet for Healthcare Providers: IncredibleEmployment.be  This test is not yet approved or cleared by the [place] FDA and has been authorized for detection and/or diagnosis of SARS-CoV-2 by FDA under an Emergency Use Authorization (EUA). This EUA will remain in effect (meaning this test can be used) for the duration of the COVID-19 declaration under Section 564(b)(1) of the  Act, 21 U.S.C. section 360bbb-3(b)(1), unless the authorization is terminated or revoked.  Performed at [facility], [address] [place], [place] [address]      Scheduled Meds: . atenolol  12.5 mg Oral Daily  . enoxaparin (LOVENOX) injection  0.5 mg/kg Subcutaneous Q2200  . furosemide  40 mg Oral Daily   Continuous Infusions: . cefTRIAXone (ROCEPHIN)  IV 2 g (09/26/20 0843)  . doxycycline (VIBRAMYCIN) IV 100 mg (09/26/20 1856)    Assessment/Plan:  1. Clinical sepsis, present on admission.  Patient had fever, tachycardia and right thigh cellulitis with chronic lower extremity lymphedema and erythema.  Talk with patient on Rocephin and doxycycline currently.  Patient does have an allergy to vancomycin.  Patient failed outpatient treatment with Bactrim. 2. Lactic acidosis from sepsis 3. Chronic lymphedema.  Lymphedema treatments as outpatient.  Continue Lasix orally. 4. Acute kidney injury.  Patient's creatinine 0.88 at baseline.  Was 1.20 on presentation down to 1.05 currently.  Holding Bactrim. 5. Morbid obesity with a BMI of  65.37 6. Essential hypertension.  On decreased dose of atenolol with blood pressure being on the lower side.    Code Status:     Code Status Orders  (From admission, onward)         Start     Ordered   09/24/20 2004  Full code  Continuous        09/24/20 2003        Code Status History    Date Active Date Inactive Code Status Order ID Comments User Context   04/15/2020 1705 04/17/2020 2101 Full Code [id number]  Sharen Hones, MD ED   10/20/2019 1959 10/29/2019 0020 Full Code [id number]  Sharen Hones, MD ED   06/01/2019 2152 06/07/2019 2017 Full Code [id number]  Orene Desanctis, DO ED   04/10/2019 1325 04/14/2019 2024 Full Code [id number]  Bettey Costa, MD ED   11/03/2018 1715 11/05/2018 1956 Full Code [id number]  Mayo, Pete Pelt, MD Inpatient   04/17/2018 0905 04/19/2018 1649 Full Code [id number]  Arta Silence, MD Inpatient   08/04/2016 0246 08/07/2016 1731 Full Code [id number]  Lance Coon, MD Inpatient   01/23/2015 2114 01/28/2015 1717 Full Code [id number]  Hower, Aaron Mose, MD ED   Advance Care Planning Activity     Disposition Plan: Status is: Inpatient  Dispo: The patient is from: Home              Anticipated d/c is to: Home              Patient currently requiring IV antibiotics for clinical sepsis and cellulitis.   Difficult to place patient.  No.  Time spent: 28 minutes  [place]

## 2020-09-26 NOTE — Evaluation (Signed)
Physical Therapy Evaluation Patient Details Name: [name] MRN: [id number] DOB: [birth year] Today's Date: 09/26/2020   History of Present Illness  Pt is a 65 y.o. female with hx of lymphedema and recurrent lower extremity cellulitis, OSA, hypertension, asthma, morbid obesity, who presents with lower extremity pain concern for cellulitis.  MD assessment includes: sepsis, lactic acidosis, chronic lymphedema, AKI, HTN, and morbid obesity.    Clinical Impression  Pt was pleasant and motivated to participate during the session.  Pt required min A for RLE control during bed mobility initially but then pt provided a flat sheet and was able to assist her LE during bed mobility tasks without PT assist.  Pt had some difficulty with standing from a standard height surface secondary to poor BLE knee flexion due to body habitus.  Training provided from various height surfaces with cues for max available knee flex with pt able to come to standing without physical assist.  Pt was able to amb 30 feet with wide BOS secondary to body habitus as well as slow cadence and short B step length but was steady without LOB using a QC as well as furniture cruising.  Pt reported feeling minimally weaker than at baseline and will benefit from HHPT services upon discharge to safely address deficits listed in patient problem list for decreased caregiver assistance and eventual return to PLOF.      Follow Up Recommendations Home health PT;Supervision - Intermittent    Equipment Recommendations  None recommended by PT    Recommendations for Other Services       Precautions / Restrictions Precautions Precautions: Fall Restrictions Weight Bearing Restrictions: No      Mobility  Bed Mobility Overal bed mobility: Modified Independent             General bed mobility comments: Pt used a bed sheet to assist her LEs during bed mobility tasks    Transfers Overall transfer level: Needs  assistance Equipment used: Quad cane Transfers: Sit to/from Stand Sit to Stand: Min guard         General transfer comment: Pt able to stand from multiple height surfaces with CGA and min cues for sequencing, limited by decreased ability to bend her knees seconary to body habitus  Ambulation/Gait Ambulation/Gait assistance: Supervision Gait Distance (Feet): 30 Feet Assistive device: Quad cane Gait Pattern/deviations: Step-through pattern;Decreased step length - right;Decreased step length - left;Wide base of support Gait velocity: decreased   General Gait Details: Wide BOS secondary to body habitus with short B step length but steady without LOB  Stairs            Wheelchair Mobility    Modified Rankin (Stroke Patients Only)       Balance Overall balance assessment: Needs assistance   Sitting balance-Leahy Scale: Normal     Standing balance support: Single extremity supported Standing balance-Leahy Scale: Good                               Pertinent Vitals/Pain Pain Assessment: 0-10 Pain Score: 4  Pain Location: HA Pain Descriptors / Indicators: Headache Pain Intervention(s): RN gave pain meds during session;Patient requesting pain meds-RN notified;Monitored during session    Home Living Family/patient expects to be discharged to:: Private residence Living Arrangements: Children Available Help at Discharge: Family;Available PRN/intermittently Type of Home: House Home Access: Stairs to enter Entrance Stairs-Rails: Right Entrance Stairs-Number of Steps: 1 Home Layout: One level Home Equipment: [name] -  quad;Walker - standard;Bedside commode;Grab bars - tub/shower;Hand held shower head;Grab bars - toilet;Toilet riser;Hospital bed      Prior Function Level of Independence: Independent with assistive device(s)         Comments: Mod Ind amb with a QC limited community distances, Ind with ADLs     Hand Dominance        Extremity/Trunk  Assessment   Upper Extremity Assessment Upper Extremity Assessment: Overall WFL for tasks assessed    Lower Extremity Assessment Lower Extremity Assessment: Generalized weakness       Communication   Communication: No difficulties  Cognition Arousal/Alertness: Awake/alert Behavior During Therapy: WFL for tasks assessed/performed Overall Cognitive Status: Within Functional Limits for tasks assessed                                        General Comments      Exercises Other Exercises Other Exercises: Transfer training from various height surfaces Other Exercises: Bed mobility training with using a sheet for pt to assist her legs during bed mobility tasks   Assessment/Plan    PT Assessment Patient needs continued PT services  PT Problem List Decreased strength;Decreased activity tolerance;Decreased balance;Decreased mobility       PT Treatment Interventions DME instruction;Gait training;Stair training;Functional mobility training;Therapeutic activities;Therapeutic exercise;Balance training;Patient/family education    PT Goals (Current goals can be found in the Care Plan section)  Acute Rehab PT Goals Patient Stated Goal: To keep her strength so she can take care of herself at home PT Goal Formulation: With patient Time For Goal Achievement: 10/09/20 Potential to Achieve Goals: Good    Frequency Min 2X/week   Barriers to discharge        Co-evaluation               AM-PAC PT "6 Clicks" Mobility  Outcome Measure Help needed turning from your back to your side while in a flat bed without using bedrails?: A Little Help needed moving from lying on your back to sitting on the side of a flat bed without using bedrails?: A Little Help needed moving to and from a bed to a chair (including a wheelchair)?: A Little Help needed standing up from a chair using your arms (e.g., wheelchair or bedside chair)?: A Little Help needed to walk in hospital room?:  A Little Help needed climbing 3-5 steps with a railing? : A Lot 6 Click Score: 17    End of Session Equipment Utilized During Treatment: Gait belt Activity Tolerance: Patient tolerated treatment well Patient left: in bed;with call bell/phone within reach;with bed alarm set Nurse Communication: Mobility status;Other (comment) (Needs bariatric recliner; requested lotion for BLE itching) PT Visit Diagnosis: Muscle weakness (generalized) (M62.81);Difficulty in walking, not elsewhere classified (R26.2)    Time: 1015-1101 PT Time Calculation (min) (ACUTE ONLY): 46 min   Charges:   PT Evaluation $PT Eval Moderate Complexity: 1 Mod PT Treatments $Therapeutic Activity: 8-22 mins        D. Scott Zyia Kaneko PT, DPT 09/26/20, 11:27 AM

## 2020-09-27 DIAGNOSIS — Z66 Do not resuscitate: Secondary | ICD-10-CM

## 2020-09-27 DIAGNOSIS — L03116 Cellulitis of left lower limb: Secondary | ICD-10-CM | POA: Diagnosis not present

## 2020-09-27 DIAGNOSIS — I890 Lymphedema, not elsewhere classified: Secondary | ICD-10-CM

## 2020-09-27 DIAGNOSIS — L03115 Cellulitis of right lower limb: Secondary | ICD-10-CM

## 2020-09-27 DIAGNOSIS — D649 Anemia, unspecified: Secondary | ICD-10-CM

## 2020-09-27 LAB — CBC
HCT: 33 % — ABNORMAL LOW (ref 36.0–46.0)
Hemoglobin: 10.1 g/dL — ABNORMAL LOW (ref 12.0–15.0)
MCH: 25.8 pg — ABNORMAL LOW (ref 26.0–34.0)
MCHC: 30.6 g/dL (ref 30.0–36.0)
MCV: 84.2 fL (ref 80.0–100.0)
Platelets: 156 10*3/uL (ref 150–400)
RBC: 3.92 MIL/uL (ref 3.87–5.11)
RDW: 14.6 % (ref 11.5–15.5)
WBC: 5.4 10*3/uL (ref 4.0–10.5)
nRBC: 0 % (ref 0.0–0.2)

## 2020-09-27 LAB — BASIC METABOLIC PANEL
Anion gap: 6 (ref 5–15)
BUN: 15 mg/dL (ref 8–23)
CO2: 27 mmol/L (ref 22–32)
Calcium: 8.6 mg/dL — ABNORMAL LOW (ref 8.9–10.3)
Chloride: 104 mmol/L (ref 98–111)
Creatinine, Ser: 0.89 mg/dL (ref 0.44–1.00)
GFR, Estimated: >60 mL/min (ref >60)
Glucose, Bld: 87 mg/dL (ref 70–99)
Potassium: 4.4 mmol/L (ref 3.5–5.1)
Sodium: 137 mmol/L (ref 135–145)

## 2020-09-27 MED ORDER — LINEZOLID 600 MG/300ML IV SOLN
600.0000 mg | Freq: Two times a day (BID) | INTRAVENOUS | Status: DC
  Administered 2020-09-27 – 2020-09-28 (×3): 600 mg via INTRAVENOUS
  Filled 2020-09-27 (×6): qty 300

## 2020-09-27 NOTE — Care Management Important Message (Signed)
Important Message  Patient Details  Name: [name] MRN: [id number] Date of Birth: 02-May-1956   Medicare Important Message Given:  N/A - LOS <3 / Initial given by admissions     Juliann Pulse A Lyndy Russman 09/27/2020, 8:45 AM

## 2020-09-27 NOTE — Progress Notes (Signed)
Patient ID: [name], female   DOB: May 06, 1956, 65 y.o.   MRN: [id number] [facility] PROGRESS NOTE  [name] QIW:[id number] DOB: 10/12/1955 DOA: 09/24/2020 PCP: Elizabeth Plumber, PA  HPI/Subjective: Patient feeling better than when she came into the hospital.  Admitted with clinical sepsis secondary to cellulitis.  Patient has a history of lymphedema.  She states that the right upper thigh is feeling a little bit better but still having some tightness in the posterior right thigh.  Objective: Vitals:   09/27/20 0823 09/27/20 1213  BP: 125/60 120/66  Pulse: 68 64  Resp: 16 17  Temp: 98.9 F (37.2 C) 97.9 F (36.6 C)  SpO2: 96% 97%    Intake/Output Summary (Last 24 hours) at 09/27/2020 1620 Last data filed at 09/27/2020 1542 Gross per 24 hour  Intake 720 ml  Output 3400 ml  Net -2680 ml   Filed Weights   09/24/20 1433  Weight: (!) 167.4 kg    ROS: Review of Systems  Respiratory: Negative for shortness of breath.   Cardiovascular: Negative for chest pain.  Gastrointestinal: Negative for abdominal pain.   Exam: Physical Exam HENT:     Head: Normocephalic.     Mouth/Throat:     Pharynx: No oropharyngeal exudate.  Eyes:     General: Lids are normal.     Conjunctiva/sclera: Conjunctivae normal.  Cardiovascular:     Rate and Rhythm: Normal rate and regular rhythm.     Heart sounds: Normal heart sounds, S1 normal and S2 normal.  Pulmonary:     Breath sounds: Normal breath sounds. No decreased breath sounds, wheezing, rhonchi or rales.  Abdominal:     Palpations: Abdomen is soft.     Tenderness: There is no abdominal tenderness.  Musculoskeletal:     Right upper leg: Swelling present.     Left upper leg: Swelling present.     Right knee: Swelling present.     Left knee: Swelling present.     Right lower leg: Swelling present.     Left lower leg: Swelling present.  Skin:    General: Skin is warm.     Comments: Bilateral lower extremity  chronic skin discoloration with erythema.  Right anterior thigh erythema fading.  Right posterior thigh dark erythema with warmth and sensitivity to palpation.  Neurological:     Mental Status: She is alert and oriented to person, place, and time.       Data Reviewed: Basic Metabolic Panel: Recent Labs  Lab 09/24/20 1457 09/25/20 0555 09/26/20 0442 09/27/20 0633  NA 135 134* 135 137  K 4.7 4.1 4.0 4.4  CL 102 103 104 104  CO2 24 24 24 27   GLUCOSE 99 95 102* 87  BUN 21 19 19 15   CREATININE 1.20* 1.18* 1.05* 0.89  CALCIUM 9.0 8.2* 8.2* 8.6*   Liver Function Tests: Recent Labs  Lab 09/24/20 1457 09/25/20 0555  AST 21 14*  ALT 12 10  ALKPHOS 117 82  BILITOT 0.9 0.8  PROT 9.1* 6.9  ALBUMIN 3.7 2.7*   CBC: Recent Labs  Lab 09/24/20 1457 09/25/20 0555 09/26/20 0442 09/27/20 0633  WBC 15.1* 12.4* 7.2 5.4  NEUTROABS 13.7*  --   --   --   HGB 12.7 10.0* 9.8* 10.1*  HCT 40.3 31.9* 32.3* 33.0*  MCV 83.1 84.2 85.0 84.2  PLT 165 144* 144* 156    BNP (last 3 results) Recent Labs    04/15/20 1256 09/24/20 1457  BNP 77.1 120.4*  Recent Results (from the past 240 hour(s))  Culture, blood (Routine x 2)     Status: None (Preliminary result)   Collection Time: 09/24/20  2:57 PM   Specimen: BLOOD  Result Value Ref Range Status   Specimen Description BLOOD BLOOD RIGHT HAND  Final   Special Requests   Final    BOTTLES DRAWN AEROBIC AND ANAEROBIC Blood Culture results may not be optimal due to an inadequate volume of blood received in culture bottles   Culture   Final    NO GROWTH 3 DAYS Performed at [facility], [address], [place], [place] [address]    Report Status PENDING  Incomplete  Culture, blood (Routine x 2)     Status: None (Preliminary result)   Collection Time: 09/24/20  2:57 PM   Specimen: BLOOD  Result Value Ref Range Status   Specimen Description BLOOD BLOOD RIGHT FOREARM  Final   Special Requests   Final    BOTTLES DRAWN AEROBIC  AND ANAEROBIC Blood Culture results may not be optimal due to an inadequate volume of blood received in culture bottles   Culture   Final    NO GROWTH 3 DAYS Performed at [facility], [address], [place], [place] [address]    Report Status PENDING  Incomplete  Resp Panel by RT-PCR (Flu A&B, Covid) Nasopharyngeal Swab     Status: None   Collection Time: 09/24/20  2:57 PM   Specimen: Nasopharyngeal Swab; Nasopharyngeal(NP) swabs in vial transport medium  Result Value Ref Range Status   SARS Coronavirus 2 by RT PCR NEGATIVE NEGATIVE Final    Comment: (NOTE) SARS-CoV-2 target nucleic acids are NOT DETECTED.  The SARS-CoV-2 RNA is generally detectable in upper respiratory specimens during the acute phase of infection. The lowest concentration of SARS-CoV-2 viral copies this assay can detect is 138 copies/mL. A negative result does not preclude SARS-Cov-2 infection and should not be used as the sole basis for treatment or other patient management decisions. A negative result may occur with  improper specimen collection/handling, submission of specimen other than nasopharyngeal swab, presence of viral mutation(s) within the areas targeted by this assay, and inadequate number of viral copies(<138 copies/mL). A negative result must be combined with clinical observations, patient history, and epidemiological information. The expected result is Negative.  Fact Sheet for Patients:  EntrepreneurPulse.com.au  Fact Sheet for Healthcare Providers:  IncredibleEmployment.be  This test is no t yet approved or cleared by the [place] FDA and  has been authorized for detection and/or diagnosis of SARS-CoV-2 by FDA under an Emergency Use Authorization (EUA). This EUA will remain  in effect (meaning this test can be used) for the duration of the COVID-19 declaration under Section 564(b)(1) of the Act, 21 U.S.C.section 360bbb-3(b)(1), unless the  authorization is terminated  or revoked sooner.       Influenza A by PCR NEGATIVE NEGATIVE Final   Influenza B by PCR NEGATIVE NEGATIVE Final    Comment: (NOTE) The Xpert Xpress SARS-CoV-2/FLU/RSV plus assay is intended as an aid in the diagnosis of influenza from Nasopharyngeal swab specimens and should not be used as a sole basis for treatment. Nasal washings and aspirates are unacceptable for Xpert Xpress SARS-CoV-2/FLU/RSV testing.  Fact Sheet for Patients: EntrepreneurPulse.com.au  Fact Sheet for Healthcare Providers: IncredibleEmployment.be  This test is not yet approved or cleared by the [place] FDA and has been authorized for detection and/or diagnosis of SARS-CoV-2 by FDA under an Emergency Use Authorization (EUA). This  EUA will remain in effect (meaning this test can be used) for the duration of the COVID-19 declaration under Section 564(b)(1) of the Act, 21 U.S.C. section 360bbb-3(b)(1), unless the authorization is terminated or revoked.  Performed at [facility], [address] [place], [place] [address]       Scheduled Meds: . atenolol  12.5 mg Oral Daily  . enoxaparin (LOVENOX) injection  0.5 mg/kg Subcutaneous Q2200  . furosemide  40 mg Oral Daily   Continuous Infusions: . cefTRIAXone (ROCEPHIN)  IV 2 g (09/26/20 0843)  . linezolid (ZYVOX) IV     Brief history.  Patient admitted 09/25/2019 with cellulitis bilateral lower extremities.  Patient failed 2 weeks of outpatient Bactrim.  Patient initially was placed on Unasyn and doxycycline.  I change the Unasyn over to Rocephin.  Today change doxycycline over to Zyvox.  Assessment/Plan:  1. Clinical sepsis, present on admission.  Patient had fever tachycardia and right thigh cellulitis with chronic lower extremity lymphedema and erythema.  Currently on Rocephin and Zyvox.  Asked infectious disease for evaluation.  Patient does have an allergy to vancomycin.   Patient had failed outpatient treatment with Bactrim.  Patient still has deep red erythema posterior right thigh.  Anterior right thigh seems to be improving.  Bilateral lower extremities still has erythema and warmth.  Still requires IV antibiotics.  White blood cell count has normalized. 2. Mild lactic acidosis from sepsis. 3. Chronic lymphedema.  Continue lymphedema treatments as outpatient. 4. Acute kidney injury.  Patient was on Bactrim as outpatient.  Creatinine 1.20 on presentation and down to 0.89 today. 5. Morbid obesity with a BMI of 65.37 6. Essential hypertension.  I cut back her dose of atenolol.  Continue Lasix. 7. Patient changed her CODE STATUS to DNR today.     Code Status:     Code Status Orders  (From admission, onward)         Start     Ordered   09/27/20 1131  Do not attempt resuscitation (DNR)  Continuous       Question Answer Comment  In the event of cardiac or respiratory ARREST Do not call a "code blue"   In the event of cardiac or respiratory ARREST Do not perform Intubation, CPR, defibrillation or ACLS   In the event of cardiac or respiratory ARREST Use medication by any route, position, wound care, and other measures to relive pain and suffering. May use oxygen, suction and manual treatment of airway obstruction as needed for comfort.   Comments nurse may pronounce      09/27/20 1130        Code Status History    Date Active Date Inactive Code Status Order ID Comments User Context   09/24/2020 2003 09/27/2020 1130 Full Code [id number]  Clarnce Flock, MD Inpatient   04/15/2020 1705 04/17/2020 2101 Full Code [id number]  Sharen Hones, MD ED   10/20/2019 1959 10/29/2019 0020 Full Code [id number]  Sharen Hones, MD ED   06/01/2019 2152 06/07/2019 2017 Full Code [id number]  Orene Desanctis, DO ED   04/10/2019 1325 04/14/2019 2024 Full Code [id number]  Bettey Costa, MD ED   11/03/2018 1715 11/05/2018 1956 Full Code [id number]  Mayo, Pete Pelt, MD Inpatient   04/17/2018 0905  04/19/2018 1649 Full Code [id number]  Arta Silence, MD Inpatient   08/04/2016 0246 08/07/2016 1731 Full Code [id number]  Lance Coon, MD Inpatient   01/23/2015 2114 01/28/2015 1717 Full Code [id number]  Hower, Aaron Mose, MD ED  Advance Care Planning Activity     Disposition Plan: Status is: Inpatient  Dispo: The patient is from: Home              Anticipated d/c is to: Home              Patient currently still requiring IV antibiotics to treat cellulitis.   Difficult to place patient.  No.  Antibiotics:  Rocephin  Zyvox  Time spent: 27 minutes  [place]

## 2020-09-27 NOTE — Consult Note (Signed)
NAME: [name]  DOB: 09/25/1955  MRN: [id number]  Date/Time: 09/27/2020 3:26 PM  REQUESTING PROVIDER: Dr. Leslye Peer Subjective:  REASON FOR CONSULT: Cellulitis ? [name] is a 65 y.o. female with a history of lymphedema legs, hypertension, sleep apnea, followed at wound clinic, history of DVT of right lower extremity known to me from prior hospitalizations for cellulitis is admitted to the hospital with fever of 102.9 at home.  She received a gram of Tylenol from EMS.  As per patient she has been on Bactrim for 15 days for cellulitis of bilateral legs.  She has been wrapping her legs daily.  She woke up on 09/24/2020 with sudden onset symptoms of nausea vomiting chills. In the ED vitals BP 135/78, temp 99, pulse 85, respiratory rate 19, and sats 99% on 3 L oxygen. WBC was 15.1, Hb 12.7 and platelet 165.  Creatinine 1.20. Blood cultures were sent and she was started on Vanco and Unasyn and clindamycin followed by cefepime and doxycycline and now she is on linezolid and ceftriaxone.  I am asked to see the patient for antibiotic management. Pt is improving since admission C/o induration and pain rt thigh posteriorly and medially Past Medical History:  Diagnosis Date   Arthritis    Asthma    Enlarged heart    Hypertension    Lymphedema    Sleep apnea    Spinal stenosis     Past Surgical History:  Procedure Laterality Date   COLONOSCOPY WITH PROPOFOL N/A 03/12/2018   Procedure: COLONOSCOPY WITH PROPOFOL;  Surgeon: Manya Silvas, MD;  Location: [facility] ENDOSCOPY;  Service: Endoscopy;  Laterality: N/A;   NO PAST SURGERIES      Social History   Socioeconomic History   Marital status: Single    Spouse name: Not on file   Number of children: Not on file   Years of education: Not on file   Highest education level: Not on file  Occupational History   Not on file  Tobacco Use   Smoking status: Former Smoker    Packs/day: 0.25    Start date: 07/09/1976     Quit date: 07/09/1990    Years since quitting: 30.2   Smokeless tobacco: Current User    Types: Snuff  Vaping Use   Vaping Use: Never used  Substance and Sexual Activity   Alcohol use: No   Drug use: No   Sexual activity: Not on file  Other Topics Concern   Not on file  Social History Narrative   Not on file   Social Determinants of Health   Financial Resource Strain: Not on file  Food Insecurity: Not on file  Transportation Needs: Not on file  Physical Activity: Not on file  Stress: Not on file  Social Connections: Not on file  Intimate Partner Violence: Not on file    Family History  Problem Relation Age of Onset   Thyroid disease Other    Breast cancer Maternal Aunt    Allergies  Allergen Reactions   Vancomycin Itching and Nausea And Vomiting   Ace Inhibitors Hives and Swelling   Ibuprofen Hives and Swelling   I? Current Facility-Administered Medications  Medication Dose Route Frequency Provider Last Rate Last Admin   acetaminophen (TYLENOL) tablet 650 mg  650 mg Oral Q6H PRN Clarnce Flock, MD   650 mg at 09/25/20 0215   Or   acetaminophen (TYLENOL) suppository 650 mg  650 mg Rectal Q6H PRN Clarnce Flock, MD  albuterol (VENTOLIN HFA) 108 (90 Base) MCG/ACT inhaler 2 puff  2 puff Inhalation Q6H PRN Loletha Grayer, MD   2 puff at 09/26/20 1724   atenolol (TENORMIN) tablet 12.5 mg  12.5 mg Oral Daily Loletha Grayer, MD   12.5 mg at 09/27/20 8786   cefTRIAXone (ROCEPHIN) 2 g in sodium chloride 0.9 % 100 mL IVPB  2 g Intravenous Daily Loletha Grayer, MD 200 mL/hr at 09/26/20 0843 2 g at 09/26/20 0843   enoxaparin (LOVENOX) injection 82.5 mg  0.5 mg/kg Subcutaneous Q2200 Benita Gutter, RPH   82.5 mg at 09/25/20 2330   furosemide (LASIX) tablet 40 mg  40 mg Oral Daily Loletha Grayer, MD   40 mg at 09/27/20 0906   linezolid (ZYVOX) IVPB 600 mg  600 mg Intravenous Q12H Loletha Grayer, MD       ondansetron [facility]) tablet 4 mg  4  mg Oral Q6H PRN Clarnce Flock, MD       Or   ondansetron [facility]) injection 4 mg  4 mg Intravenous Q6H PRN Clarnce Flock, MD       oxyCODONE (Oxy IR/ROXICODONE) immediate release tablet 5 mg  5 mg Oral Q4H PRN Clarnce Flock, MD   5 mg at 09/27/20 7672   polyethylene glycol (MIRALAX / GLYCOLAX) packet 17 g  17 g Oral Daily PRN Clarnce Flock, MD       traZODone (DESYREL) tablet 50 mg  50 mg Oral QHS PRN Clarnce Flock, MD   50 mg at 09/25/20 2331     Abtx:  Anti-infectives (From admission, onward)   Start     Dose/Rate Route Frequency Ordered Stop   09/27/20 1230  linezolid (ZYVOX) IVPB 600 mg        600 mg 300 mL/hr over 60 Minutes Intravenous Every 12 hours 09/27/20 1129     09/25/20 1400  cefTRIAXone (ROCEPHIN) 2 g in sodium chloride 0.9 % 100 mL IVPB        2 g 200 mL/hr over 30 Minutes Intravenous Daily 09/25/20 1347     09/24/20 1930  Ampicillin-Sulbactam (UNASYN) 3 g in sodium chloride 0.9 % 100 mL IVPB  Status:  Discontinued        3 g 200 mL/hr over 30 Minutes Intravenous Every 6 hours 09/24/20 1835 09/25/20 1347   09/24/20 1715  doxycycline (VIBRAMYCIN) 100 mg in sodium chloride 0.9 % 250 mL IVPB  Status:  Discontinued        100 mg 125 mL/hr over 120 Minutes Intravenous Every 12 hours 09/24/20 1714 09/27/20 1129   09/24/20 1600  ceFEPIme (MAXIPIME) 2 g in sodium chloride 0.9 % 100 mL IVPB        2 g 200 mL/hr over 30 Minutes Intravenous  Once 09/24/20 1545 09/24/20 1702   09/24/20 1545  Ampicillin-Sulbactam (UNASYN) 3 g in sodium chloride 0.9 % 100 mL IVPB  Status:  Discontinued        3 g 200 mL/hr over 30 Minutes Intravenous  Once 09/24/20 1543 09/24/20 1545   09/24/20 1545  clindamycin (CLEOCIN) IVPB 600 mg  Status:  Discontinued        600 mg 100 mL/hr over 30 Minutes Intravenous  Once 09/24/20 1543 09/24/20 2003      REVIEW OF SYSTEMS:  Const: fever,  chills,  weight loss Eyes: negative diplopia or visual changes, negative eye pain ENT:  negative coryza, negative sore throat Resp: negative cough, hemoptysis, dyspnea Cards: negative for chest pain, palpitations,  lower extremity edema GU: negative for frequency, dysuria and hematuria GI: Negative for abdominal pain, diarrhea, bleeding, constipation Skin: as above Heme: negative for easy bruising and gum/nose bleeding MS:  myalgias, arthralgias,and muscle weakness Neurolo:+ headaches, No dizziness, vertigo, memory problems  Psych: negative for feelings of anxiety, depression  Endocrine: negative for thyroid, diabetes Allergy/Immunology- as above Objective:  VITALS:  BP 120/66 (BP Location: Right Arm)    Pulse 64    Temp 97.9 F (36.6 C)    Resp 17    Ht 5\' 3"  (1.6 m)    Wt (!) 167.4 kg    SpO2 97%    BMI 65.37 kg/m  PHYSICAL EXAM:  General: Alert, cooperative, no distress, appears stated age. BMI>60 Head: Normocephalic, without obvious abnormality, atraumatic. Eyes: Conjunctivae clear, anicteric sclerae. Pupils are equal ENT Nares normal. No drainage or sinus tenderness. Lips, mucosa, and tongue normal. No Thrush Neck: Supple, symmetrical, no adenopathy, thyroid: non tender no carotid bruit and no JVD. Back: No CVA tenderness. Lungs: Clear to auscultation bilaterally. No Wheezing or Rhonchi. No rales. Heart: Regular rate and rhythm, no murmur, rub or gallop. Abdomen: Soft, non-tender,not distended. Bowel sounds normal. No masses Extremities:b/l severe lymphedema Rt > left Some induration over the rt thigh, medially and posteriorly. No wounds or discharge currently Skin: as above Lymph: Cervical, supraclavicular normal. Neurologic: Grossly non-focal Pertinent Labs Lab Results CBC    Component Value Date/Time   WBC 5.4 09/27/2020 0633   RBC 3.92 09/27/2020 0633   HGB 10.1 (L) 09/27/2020 0633   HGB 12.8 09/28/2013 1838   HCT 33.0 (L) 09/27/2020 0633   HCT 39.8 09/28/2013 1838   PLT 156 09/27/2020 0633   PLT 222 09/28/2013 1838   MCV 84.2 09/27/2020 0633    MCV 81 09/28/2013 1838   MCH 25.8 (L) 09/27/2020 0633   MCHC 30.6 09/27/2020 0633   RDW 14.6 09/27/2020 0633   RDW 15.5 (H) 09/28/2013 1838   LYMPHSABS 0.5 (L) 09/24/2020 1457   LYMPHSABS 2.1 09/28/2013 1838   MONOABS 0.7 09/24/2020 1457   MONOABS 0.8 09/28/2013 1838   EOSABS 0.0 09/24/2020 1457   EOSABS 0.0 09/28/2013 1838   BASOSABS 0.0 09/24/2020 1457   BASOSABS 0.1 09/28/2013 1838    CMP Latest Ref Rng & Units 09/27/2020 09/26/2020 09/25/2020  Glucose 70 - 99 mg/dL 87 102(H) 95  BUN 8 - 23 mg/dL 15 19 19   Creatinine 0.44 - 1.00 mg/dL 0.89 1.05(H) 1.18(H)  Sodium 135 - 145 mmol/L 137 135 134(L)  Potassium 3.5 - 5.1 mmol/L 4.4 4.0 4.1  Chloride 98 - 111 mmol/L 104 104 103  CO2 22 - 32 mmol/L 27 24 24   Calcium 8.9 - 10.3 mg/dL 8.6(L) 8.2(L) 8.2(L)  Total Protein 6.5 - 8.1 g/dL - - 6.9  Total Bilirubin 0.3 - 1.2 mg/dL - - 0.8  Alkaline Phos 38 - 126 U/L - - 82  AST 15 - 41 U/L - - 14(L)  ALT 0 - 44 U/L - - 10      Microbiology: Recent Results (from the past 240 hour(s))  Culture, blood (Routine x 2)     Status: None (Preliminary result)   Collection Time: 09/24/20  2:57 PM   Specimen: BLOOD  Result Value Ref Range Status   Specimen Description BLOOD BLOOD RIGHT HAND  Final   Special Requests   Final    BOTTLES DRAWN AEROBIC AND ANAEROBIC Blood Culture results may not be optimal due to an inadequate volume of blood received in  culture bottles   Culture   Final    NO GROWTH 3 DAYS Performed at [facility] ., [place], [place] [address]    Report Status PENDING  Incomplete  Culture, blood (Routine x 2)     Status: None (Preliminary result)   Collection Time: 09/24/20  2:57 PM   Specimen: BLOOD  Result Value Ref Range Status   Specimen Description BLOOD BLOOD RIGHT FOREARM  Final   Special Requests   Final    BOTTLES DRAWN AEROBIC AND ANAEROBIC Blood Culture results may not be optimal due to an inadequate volume of blood received in culture  bottles   Culture   Final    NO GROWTH 3 DAYS Performed at [facility], [address] [place], [place] [address]    Report Status PENDING  Incomplete  Resp Panel by RT-PCR (Flu A&B, Covid) Nasopharyngeal Swab     Status: None   Collection Time: 09/24/20  2:57 PM   Specimen: Nasopharyngeal Swab; Nasopharyngeal(NP) swabs in vial transport medium  Result Value Ref Range Status   SARS Coronavirus 2 by RT PCR NEGATIVE NEGATIVE Final    Comment: (NOTE) SARS-CoV-2 target nucleic acids are NOT DETECTED.  The SARS-CoV-2 RNA is generally detectable in upper respiratory specimens during the acute phase of infection. The lowest concentration of SARS-CoV-2 viral copies this assay can detect is 138 copies/mL. A negative result does not preclude SARS-Cov-2 infection and should not be used as the sole basis for treatment or other patient management decisions. A negative result may occur with  improper specimen collection/handling, submission of specimen other than nasopharyngeal swab, presence of viral mutation(s) within the areas targeted by this assay, and inadequate number of viral copies(<138 copies/mL). A negative result must be combined with clinical observations, patient history, and epidemiological information. The expected result is Negative.  Fact Sheet for Patients:  EntrepreneurPulse.com.au  Fact Sheet for Healthcare Providers:  IncredibleEmployment.be  This test is no t yet approved or cleared by the [place] FDA and  has been authorized for detection and/or diagnosis of SARS-CoV-2 by FDA under an Emergency Use Authorization (EUA). This EUA will remain  in effect (meaning this test can be used) for the duration of the COVID-19 declaration under Section 564(b)(1) of the Act, 21 U.S.C.section 360bbb-3(b)(1), unless the authorization is terminated  or revoked sooner.       Influenza A by PCR NEGATIVE NEGATIVE Final   Influenza  B by PCR NEGATIVE NEGATIVE Final    Comment: (NOTE) The Xpert Xpress SARS-CoV-2/FLU/RSV plus assay is intended as an aid in the diagnosis of influenza from Nasopharyngeal swab specimens and should not be used as a sole basis for treatment. Nasal washings and aspirates are unacceptable for Xpert Xpress SARS-CoV-2/FLU/RSV testing.  Fact Sheet for Patients: EntrepreneurPulse.com.au  Fact Sheet for Healthcare Providers: IncredibleEmployment.be  This test is not yet approved or cleared by the [place] FDA and has been authorized for detection and/or diagnosis of SARS-CoV-2 by FDA under an Emergency Use Authorization (EUA). This EUA will remain in effect (meaning this test can be used) for the duration of the COVID-19 declaration under Section 564(b)(1) of the Act, 21 U.S.C. section 360bbb-3(b)(1), unless the authorization is terminated or revoked.  Performed at [facility], [address] [place], [place] [address]     ? Impression/Recommendation ?Bilateral severe lymphedema lower extremities. Recurrent cellulitis of the legs with acute exacerbation of the rt leg Has improved since admission except rt posterior thigh Agree  with linezolid to cover gram positives Will DC ceftriaxone as no open wounds  Keep the leg elevated Compressive leg wraps? ? _Anemia  __________________________________________________ Discussed with patient, requesting provider Note:  This document was prepared using Dragon voice recognition software and may include unintentional dictation errors.

## 2020-09-28 DIAGNOSIS — L03115 Cellulitis of right lower limb: Secondary | ICD-10-CM | POA: Diagnosis not present

## 2020-09-28 DIAGNOSIS — I890 Lymphedema, not elsewhere classified: Secondary | ICD-10-CM | POA: Diagnosis not present

## 2020-09-28 DIAGNOSIS — L03116 Cellulitis of left lower limb: Secondary | ICD-10-CM | POA: Diagnosis not present

## 2020-09-28 MED ORDER — LINEZOLID 600 MG PO TABS
600.0000 mg | ORAL_TABLET | Freq: Two times a day (BID) | ORAL | Status: DC
  Administered 2020-09-28 – 2020-09-29 (×2): 600 mg via ORAL
  Filled 2020-09-28 (×3): qty 1

## 2020-09-28 MED ORDER — LORATADINE 10 MG PO TABS
10.0000 mg | ORAL_TABLET | Freq: Every day | ORAL | Status: DC
  Administered 2020-09-28 – 2020-09-29 (×2): 10 mg via ORAL
  Filled 2020-09-28 (×2): qty 1

## 2020-09-28 NOTE — Progress Notes (Addendum)
Physical Therapy Treatment Patient Details Name: [name] MRN: [id number] DOB: 03-Dec-1955 Today's Date: 09/28/2020    History of Present Illness Pt is a 65 y.o. female with hx of lymphedema and recurrent lower extremity cellulitis, OSA, hypertension, asthma, morbid obesity, who presents with lower extremity pain concern for cellulitis.  MD assessment includes: sepsis, lactic acidosis, chronic lymphedema, AKI, HTN, and morbid obesity.    PT Comments    Ready for session.  In and OOB with supervision and sheet to assist with LE mobility.  She is steady in sitting and stands somewhat unconventionally but able to do so on her own given LE girth.  Once standing she tries to walk with [facility] but has some difficulty today.  Stated she uses 2 canes at times at home or bariatric RW.  Bariatric walker was obtained and she was able to walk 90' with slow but steady gait.  Wide BOS but no LOB or buckling.    Pt stated she would like to walk more and to bathroom with staff but nursing staff seems fearful.  Bariatric commode is obtained and placed over commode in bathroom.  Bariatric RW left in room and updated board for mobility status.  Discussed with nursing students and encouraged mobility with +2 assist if staff feels more comfortable but she does do well with +1.  Pt is encouraged to use RW at home until strength and mobility improve.   Follow Up Recommendations  Home health PT;Supervision - Intermittent     Equipment Recommendations  None recommended by PT    Recommendations for Other Services       Precautions / Restrictions Precautions Precautions: Fall Restrictions Weight Bearing Restrictions: No    Mobility  Bed Mobility Overal bed mobility: Modified Independent                  Transfers Overall transfer level: Needs assistance Equipment used: Rolling walker (2 wheeled);Quad cane Transfers: Sit to/from Stand Sit to Stand: Min guard             Ambulation/Gait Ambulation/Gait assistance: Scientist, forensic (Feet): 90 Feet Assistive device: Rolling walker (2 wheeled) Gait Pattern/deviations: Step-through pattern;Decreased step length - right;Decreased step length - left;Wide base of support Gait velocity: decreased   General Gait Details: Wide BOS secondary to body habitus with short B step length but steady without LOB, trouble wiht SBQC today but did well with RW - has both at home already   [employer] Mobility    Modified Rankin (Stroke Patients Only)       Balance Overall balance assessment: Needs assistance   Sitting balance-Leahy Scale: Normal     Standing balance support: Single extremity supported Standing balance-Leahy Scale: Good                              Cognition Arousal/Alertness: Awake/alert Behavior During Therapy: WFL for tasks assessed/performed Overall Cognitive Status: Within Functional Limits for tasks assessed                                        Exercises      General Comments        Pertinent Vitals/Pain Pain Assessment: No/denies pain    Home Living  Prior Function            PT Goals (current goals can now be found in the care plan section) Progress towards PT goals: Progressing toward goals    Frequency    Min 2X/week      PT Plan Current plan remains appropriate    Co-evaluation              AM-PAC PT "6 Clicks" Mobility   Outcome Measure    Help needed moving from lying on your back to sitting on the side of a flat bed without using bedrails?: A Little Help needed moving to and from a bed to a chair (including a wheelchair)?: A Little Help needed standing up from a chair using your arms (e.g., wheelchair or bedside chair)?: A Little Help needed to walk in hospital room?: A Little Help needed climbing 3-5 steps with a railing? : A Lot 6 Click Score:  14    End of Session Equipment Utilized During Treatment: Gait belt Activity Tolerance: Patient tolerated treatment well Patient left: in bed;with call bell/phone within reach;with bed alarm set;with nursing/sitter in room Nurse Communication: Mobility status;Other (comment) PT Visit Diagnosis: Muscle weakness (generalized) (M62.81);Difficulty in walking, not elsewhere classified (R26.2)     Time: 6720-9470 PT Time Calculation (min) (ACUTE ONLY): 29 min  Charges:  $Gait Training: 23-37 mins                    Chesley Noon, PTA 09/28/20, 10:45 AM

## 2020-09-28 NOTE — Progress Notes (Signed)
   Date of Admission:  09/24/2020     ID: [name] is a 65 y.o. female Active Problems:   Lymphedema   OSA (obstructive sleep apnea)   HTN (hypertension)   Asthma   Sepsis ([place])   Morbid obesity with BMI of 60.0-69.9, adult (HCC)   Essential hypertension   Bilateral lower leg cellulitis   Lactic acidosis   AKI (acute kidney injury) ([place])   DNR (do not resuscitate)    Subjective: Pt doing better Walked in the corridor with PT Leg pain and erythema better  Medications:  . atenolol  12.5 mg Oral Daily  . enoxaparin (LOVENOX) injection  0.5 mg/kg Subcutaneous Q2200  . furosemide  40 mg Oral Daily  . loratadine  10 mg Oral Daily    Objective: Patient Vitals for the past 24 hrs:  BP Temp Temp src Pulse Resp SpO2  09/28/20 1128 131/60 98.8 F (37.1 C) -- 61 19 100 %  09/28/20 1033 138/77 -- -- 69 -- --  09/28/20 0751 110/88 98.6 F (37 C) -- 65 17 95 %  09/28/20 0420 138/64 (!) 97.4 F (36.3 C) Oral 70 18 97 %  09/27/20 2338 125/63 97.9 F (36.6 C) -- 71 18 99 %  09/27/20 2021 131/72 98 F (36.7 C) -- 71 17 96 %  09/27/20 1638 106/68 98.3 F (36.8 C) Oral 64 16 98 %   PHYSICAL EXAM:  General: Alert, cooperative, no distress, appears stated age.  Lungs: Clear to auscultation bilaterally. No Wheezing or Rhonchi. No rales. Heart: Regular rate and rhythm, no murmur, rub or gallop. Abdomen: Soft, non-tender,not distended. Bowel sounds normal. No masses Extremities: b/l lymphedema Verrucous changes  Erythema  Posteriorly much better Lymph: Cervical, supraclavicular normal. Neurologic: Grossly non-focal  Lab Results Recent Labs    09/26/20 0442 09/27/20 0633  WBC 7.2 5.4  HGB 9.8* 10.1*  HCT 32.3* 33.0*  NA 135 137  K 4.0 4.4  CL 104 104  CO2 24 27  BUN 19 15  CREATININE 1.05* 0.89  Microbiology: 09/24/20 BC neg   Assessment/Plan: Bilateral severe lymphedema lower extremities. Recurrent cellulitis of the legs with acute exacerbation of the rt  leg Currently on linezolid and improving- can change IV to PO and on discharge will give for another 7 days  Keep the leg elevated Compressive leg wraps? ? _Anemia  Discussed the management with patient-  ID will sign off- she will follow up with Wound care clinic

## 2020-09-28 NOTE — Progress Notes (Signed)
PROGRESS NOTE    [name]  BMW:[id number] DOB: June 20, 1956 DOA: 09/24/2020 PCP: Ranae Plumber, PA   Brief Narrative:  65 y.o. female with hx of lymphedema and recurrent lower extremity cellulitis, OSA, hypertension, asthma, morbid obesity, who presents with lower extremity pain concern for cellulitis.  Patient reports she has had longstanding lymphedema as well as recurrent bouts of cellulitis in her shins.  She reports that she started having problems about 2 weeks ago and had been prescribed Bactrim which she had been taking consistently without any missed doses.  Earlier today she felt very ill, she has had sepsis before and reported that she felt exactly the same as the last time she had gotten sepsis from a skin infection so she decided to present to the ED.  No other symptoms besides a general feeling of unwellness.  In the ED vital signs notable for fever to 102.3 Fahrenheit and tachycardia to the low 100s, as well as hypertension.  Given multiple failed trials of antibiotics infectious diseases consulted for recommendations.  Patient is known to the infectious disease service.  Started on IV Zyvox.  Symptomatic cellulitis is beginning to improve.   Assessment & Plan:   Active Problems:   Lymphedema   OSA (obstructive sleep apnea)   HTN (hypertension)   Asthma   Sepsis ([place])   Morbid obesity with BMI of 60.0-69.9, adult (HCC)   Essential hypertension   Bilateral lower leg cellulitis   Lactic acidosis   AKI (acute kidney injury) ([place])   DNR (do not resuscitate)  1. Clinical sepsis, present on admission.  Patient had fever tachycardia and right thigh cellulitis with chronic lower extremity lymphedema and erythema.  Started on Rocephin and Zyvox.  After infectious disease evaluation Rocephin discontinued due to lack of purulence.  Appears to be slowly resolving with Zyvox therapy.  Appreciate infectious disease follow-up.   2. Mild lactic acidosis, resolved.  Likely  secondary to sepsis 3. Chronic lymphedema.  Continue lymphedema treatments as outpatient. 4. Acute kidney injury.  Patient was on Bactrim as outpatient.  Creatinine 1.20 on presentation.  AKI resolved 5. Morbid obesity with a BMI of 65.37.  This complicates overall care 6. Essential hypertension.    Continue decreased dose atenolol.  Continue Lasix per home dose     DVT prophylaxis: SQ Lovenox Code Status: DNR Family Communication: Attempted to call son Roderic Palau [phone] on 3/23.  No answer, voicemail box full Disposition Plan: Status is: Inpatient  Remains inpatient appropriate because:Inpatient level of care appropriate due to severity of illness   Dispo: The patient is from: Home              Anticipated d/c is to: Home              Patient currently is not medically stable to d/c.   Difficult to place patient No  Complicated cellulitis in the setting of chronic lymphedema.  On IV Zyvox.  Infectious disease following.  Possible disposition within 48 hours.  Anticipate discharge home with home health services.     Level of care: Med-Surg  Consultants:   Infectious disease  Procedures:  None  Antimicrobials:   Linezolid   Subjective: Patient seen and examined.  Reports improvement in pain and lower extremity.  No other complaints  Objective: Vitals:   09/28/20 0420 09/28/20 0751 09/28/20 1033 09/28/20 1128  BP: 138/64 110/88 138/77 131/60  Pulse: 70 65 69 61  Resp: 18 17  19   Temp: (!) 97.4 F (36.3  C) 98.6 F (37 C)  98.8 F (37.1 C)  TempSrc: Oral     SpO2: 97% 95%  100%  Weight:      Height:        Intake/Output Summary (Last 24 hours) at 09/28/2020 1446 Last data filed at 09/28/2020 1406 Gross per 24 hour  Intake 240 ml  Output 1950 ml  Net -1710 ml   Filed Weights   09/24/20 1433  Weight: (!) 167.4 kg    Examination:  General exam: No acute distress Respiratory system: Distant lung sounds.  Decreased at bases.  Normal work of  breathing.   Cardiovascular system: Distant heart sounds.  S1-S2.  No appreciable murmurs Gastrointestinal system: Obese, nontender, nondistended, normal bowel sounds Central nervous system: Alert and oriented. No focal neurological deficits. Extremities: 5X5 power bilateral upper extremities.  Bilateral lower extremities severe chronic lymphedema with overlying cellulitic change Skin: Bilateral lower extremity lymphedema with cellulitic change Psychiatry: Judgement and insight appear normal. Mood & affect appropriate.     Data Reviewed: I have personally reviewed following labs and imaging studies  CBC: Recent Labs  Lab 09/24/20 1457 09/25/20 0555 09/26/20 0442 09/27/20 0633  WBC 15.1* 12.4* 7.2 5.4  NEUTROABS 13.7*  --   --   --   HGB 12.7 10.0* 9.8* 10.1*  HCT 40.3 31.9* 32.3* 33.0*  MCV 83.1 84.2 85.0 84.2  PLT 165 144* 144* 272   Basic Metabolic Panel: Recent Labs  Lab 09/24/20 1457 09/25/20 0555 09/26/20 0442 09/27/20 0633  NA 135 134* 135 137  K 4.7 4.1 4.0 4.4  CL 102 103 104 104  CO2 24 24 24 27   GLUCOSE 99 95 102* 87  BUN 21 19 19 15   CREATININE 1.20* 1.18* 1.05* 0.89  CALCIUM 9.0 8.2* 8.2* 8.6*   GFR: Estimated Creatinine Clearance: 97.9 mL/min (by C-G formula based on SCr of 0.89 mg/dL). Liver Function Tests: Recent Labs  Lab 09/24/20 1457 09/25/20 0555  AST 21 14*  ALT 12 10  ALKPHOS 117 82  BILITOT 0.9 0.8  PROT 9.1* 6.9  ALBUMIN 3.7 2.7*   No results for input(s): LIPASE, AMYLASE in the last 168 hours. No results for input(s): AMMONIA in the last 168 hours. Coagulation Profile: Recent Labs  Lab 09/24/20 1457  INR 1.2   Cardiac Enzymes: No results for input(s): CKTOTAL, CKMB, CKMBINDEX, TROPONINI in the last 168 hours. BNP (last 3 results) No results for input(s): PROBNP in the last 8760 hours. HbA1C: No results for input(s): HGBA1C in the last 72 hours. CBG: No results for input(s): GLUCAP in the last 168 hours. Lipid Profile: No  results for input(s): CHOL, HDL, LDLCALC, TRIG, CHOLHDL, LDLDIRECT in the last 72 hours. Thyroid Function Tests: No results for input(s): TSH, T4TOTAL, FREET4, T3FREE, THYROIDAB in the last 72 hours. Anemia Panel: No results for input(s): VITAMINB12, FOLATE, FERRITIN, TIBC, IRON, RETICCTPCT in the last 72 hours. Sepsis Labs: Recent Labs  Lab 09/24/20 1457 09/24/20 1625 09/24/20 1949  LATICACIDVEN 1.8 2.2* 1.4    Recent Results (from the past 240 hour(s))  Culture, blood (Routine x 2)     Status: None (Preliminary result)   Collection Time: 09/24/20  2:57 PM   Specimen: BLOOD  Result Value Ref Range Status   Specimen Description BLOOD BLOOD RIGHT HAND  Final   Special Requests   Final    BOTTLES DRAWN AEROBIC AND ANAEROBIC Blood Culture results may not be optimal due to an inadequate volume of blood received in culture bottles  Culture   Final    NO GROWTH 4 DAYS Performed at [facility], [address] [place], [place] [address]    Report Status PENDING  Incomplete  Culture, blood (Routine x 2)     Status: None (Preliminary result)   Collection Time: 09/24/20  2:57 PM   Specimen: BLOOD  Result Value Ref Range Status   Specimen Description BLOOD BLOOD RIGHT FOREARM  Final   Special Requests   Final    BOTTLES DRAWN AEROBIC AND ANAEROBIC Blood Culture results may not be optimal due to an inadequate volume of blood received in culture bottles   Culture   Final    NO GROWTH 4 DAYS Performed at [facility], [address] [place], [place] [address]    Report Status PENDING  Incomplete  Resp Panel by RT-PCR (Flu A&B, Covid) Nasopharyngeal Swab     Status: None   Collection Time: 09/24/20  2:57 PM   Specimen: Nasopharyngeal Swab; Nasopharyngeal(NP) swabs in vial transport medium  Result Value Ref Range Status   SARS Coronavirus 2 by RT PCR NEGATIVE NEGATIVE Final    Comment: (NOTE) SARS-CoV-2 target nucleic acids are NOT DETECTED.  The SARS-CoV-2 RNA  is generally detectable in upper respiratory specimens during the acute phase of infection. The lowest concentration of SARS-CoV-2 viral copies this assay can detect is 138 copies/mL. A negative result does not preclude SARS-Cov-2 infection and should not be used as the sole basis for treatment or other patient management decisions. A negative result may occur with  improper specimen collection/handling, submission of specimen other than nasopharyngeal swab, presence of viral mutation(s) within the areas targeted by this assay, and inadequate number of viral copies(<138 copies/mL). A negative result must be combined with clinical observations, patient history, and epidemiological information. The expected result is Negative.  Fact Sheet for Patients:  EntrepreneurPulse.com.au  Fact Sheet for Healthcare Providers:  IncredibleEmployment.be  This test is no t yet approved or cleared by the [place] FDA and  has been authorized for detection and/or diagnosis of SARS-CoV-2 by FDA under an Emergency Use Authorization (EUA). This EUA will remain  in effect (meaning this test can be used) for the duration of the COVID-19 declaration under Section 564(b)(1) of the Act, 21 U.S.C.section 360bbb-3(b)(1), unless the authorization is terminated  or revoked sooner.       Influenza A by PCR NEGATIVE NEGATIVE Final   Influenza B by PCR NEGATIVE NEGATIVE Final    Comment: (NOTE) The Xpert Xpress SARS-CoV-2/FLU/RSV plus assay is intended as an aid in the diagnosis of influenza from Nasopharyngeal swab specimens and should not be used as a sole basis for treatment. Nasal washings and aspirates are unacceptable for Xpert Xpress SARS-CoV-2/FLU/RSV testing.  Fact Sheet for Patients: EntrepreneurPulse.com.au  Fact Sheet for Healthcare Providers: IncredibleEmployment.be  This test is not yet approved or cleared by the  [place] FDA and has been authorized for detection and/or diagnosis of SARS-CoV-2 by FDA under an Emergency Use Authorization (EUA). This EUA will remain in effect (meaning this test can be used) for the duration of the COVID-19 declaration under Section 564(b)(1) of the Act, 21 U.S.C. section 360bbb-3(b)(1), unless the authorization is terminated or revoked.  Performed at [facility], [address] [place], [place] [address]          Radiology Studies: No results found.      Scheduled Meds: . atenolol  12.5 mg Oral Daily  . enoxaparin (LOVENOX) injection  0.5 mg/kg  Subcutaneous Q2200  . furosemide  40 mg Oral Daily  . loratadine  10 mg Oral Daily   Continuous Infusions: . linezolid (ZYVOX) IV 600 mg (09/28/20 1118)     LOS: 4 days    Time spent: 25 minutes    Sidney Ace, MD [facility] Pager [phone]  If 7PM-7AM, please contact night-coverage 09/28/2020, 2:46 PM

## 2020-09-29 ENCOUNTER — Ambulatory Visit: Payer: Medicare HMO | Admitting: Physician Assistant

## 2020-09-29 LAB — CBC WITH DIFFERENTIAL/PLATELET
Abs Immature Granulocytes: 0.07 10*3/uL (ref 0.00–0.07)
Basophils Absolute: 0 10*3/uL (ref 0.0–0.1)
Basophils Relative: 1 %
Eosinophils Absolute: 0 10*3/uL (ref 0.0–0.5)
Eosinophils Relative: 0 %
HCT: 35.4 % — ABNORMAL LOW (ref 36.0–46.0)
Hemoglobin: 11 g/dL — ABNORMAL LOW (ref 12.0–15.0)
Immature Granulocytes: 1 %
Lymphocytes Relative: 38 %
Lymphs Abs: 2 10*3/uL (ref 0.7–4.0)
MCH: 26 pg (ref 26.0–34.0)
MCHC: 31.1 g/dL (ref 30.0–36.0)
MCV: 83.7 fL (ref 80.0–100.0)
Monocytes Absolute: 0.7 10*3/uL (ref 0.1–1.0)
Monocytes Relative: 13 %
Neutro Abs: 2.5 10*3/uL (ref 1.7–7.7)
Neutrophils Relative %: 47 %
Platelets: 166 10*3/uL (ref 150–400)
RBC: 4.23 MIL/uL (ref 3.87–5.11)
RDW: 14.1 % (ref 11.5–15.5)
WBC: 5.3 10*3/uL (ref 4.0–10.5)
nRBC: 0 % (ref 0.0–0.2)

## 2020-09-29 LAB — BASIC METABOLIC PANEL
Anion gap: 8 (ref 5–15)
BUN: 16 mg/dL (ref 8–23)
CO2: 29 mmol/L (ref 22–32)
Calcium: 8.7 mg/dL — ABNORMAL LOW (ref 8.9–10.3)
Chloride: 100 mmol/L (ref 98–111)
Creatinine, Ser: 0.93 mg/dL (ref 0.44–1.00)
GFR, Estimated: >60 mL/min (ref >60)
Glucose, Bld: 94 mg/dL (ref 70–99)
Potassium: 4.3 mmol/L (ref 3.5–5.1)
Sodium: 137 mmol/L (ref 135–145)

## 2020-09-29 LAB — CULTURE, BLOOD (ROUTINE X 2)
Culture: NO GROWTH
Culture: NO GROWTH

## 2020-09-29 MED ORDER — OXYCODONE HCL 5 MG PO TABS: 5.0000 mg | ORAL_TABLET | Freq: Four times a day (QID) | ORAL | 0 refills | Status: AC | PRN

## 2020-09-29 MED ORDER — LINEZOLID 600 MG PO TABS: 600.0000 mg | ORAL_TABLET | Freq: Two times a day (BID) | ORAL | 0 refills | Status: AC

## 2020-09-29 NOTE — Discharge Instructions (Signed)
Cellulitis, Adult  Cellulitis is a skin infection. The infected area is usually warm, red, swollen, and tender. This condition occurs most often in the arms and lower legs. The infection can travel to the muscles, blood, and underlying tissue and become serious. It is very important to get treated for this condition. What are the causes? Cellulitis is caused by bacteria. The bacteria enter through a break in the skin, such as a cut, burn, insect bite, open sore, or crack. What increases the risk? This condition is more likely to occur in people who:  Have a weak body defense system (immune system).  Have open wounds on the skin, such as cuts, burns, bites, and scrapes. Bacteria can enter the body through these open wounds.  Are older than 65 years of age.  Have diabetes.  Have a type of long-lasting (chronic) liver disease (cirrhosis) or kidney disease.  Are obese.  Have a skin condition such as: ? Itchy rash (eczema). ? Slow movement of blood in the veins (venous stasis). ? Fluid buildup below the skin (edema).  Have had radiation therapy.  Use IV drugs. What are the signs or symptoms? Symptoms of this condition include:  Redness, streaking, or spotting on the skin.  Swollen area of the skin.  Tenderness or pain when an area of the skin is touched.  Warm skin.  A fever.  Chills.  Blisters. How is this diagnosed? This condition is diagnosed based on a medical history and physical exam. You may also have tests, including:  Blood tests.  Imaging tests. How is this treated? Treatment for this condition may include:  Medicines, such as antibiotic medicines or medicines to treat allergies (antihistamines).  Supportive care, such as rest and application of cold or warm cloths (compresses) to the skin.  Hospital care, if the condition is severe. The infection usually starts to get better within 1-2 days of treatment. Follow these instructions at  home: Medicines  Take over-the-counter and prescription medicines only as told by your health care provider.  If you were prescribed an antibiotic medicine, take it as told by your health care provider. Do not stop taking the antibiotic even if you start to feel better. General instructions  Drink enough fluid to keep your urine pale yellow.  Do not touch or rub the infected area.  Raise (elevate) the infected area above the level of your heart while you are sitting or lying down.  Apply warm or cold compresses to the affected area as told by your health care provider.  Keep all follow-up visits as told by your health care provider. This is important. These visits let your health care provider make sure a more serious infection is not developing.   Contact a health care provider if:  You have a fever.  Your symptoms do not begin to improve within 1-2 days of starting treatment.  Your bone or joint underneath the infected area becomes painful after the skin has healed.  Your infection returns in the same area or another area.  You notice a swollen bump in the infected area.  You develop new symptoms.  You have a general ill feeling (malaise) with muscle aches and pains. Get help right away if:  Your symptoms get worse.  You feel very sleepy.  You develop vomiting or diarrhea that persists.  You notice red streaks coming from the infected area.  Your red area gets larger or turns dark in color. These symptoms may represent a serious problem that is   an emergency. Do not wait to see if the symptoms will go away. Get medical help right away. Call your local emergency services (911 in the [place] Do not drive yourself to the hospital. Summary  Cellulitis is a skin infection. This condition occurs most often in the arms and lower legs.  Treatment for this condition may include medicines, such as antibiotic medicines or antihistamines.  Take over-the-counter and prescription  medicines only as told by your health care provider. If you were prescribed an antibiotic medicine, do not stop taking the antibiotic even if you start to feel better.  Contact a health care provider if your symptoms do not begin to improve within 1-2 days of starting treatment or your symptoms get worse.  Keep all follow-up visits as told by your health care provider. This is important. These visits let your health care provider make sure that a more serious infection is not developing. This information is not intended to replace advice given to you by your health care provider. Make sure you discuss any questions you have with your health care provider. Document Revised: 07/06/2019 Document Reviewed: 11/14/2017 Elsevier Patient Education  2021 [place]. Linezolid tablets What is this medicine? LINEZOLID (li NE zoh lid) is an oxazolidinone antibiotic. It is used to treat certain kinds of bacterial infections. It will not work for colds, flu, or other viral infections. This medicine may be used for other purposes; ask your health care provider or pharmacist if you have questions. COMMON BRAND NAME(S): Zyvox What should I tell my health care provider before I take this medicine? They need to know if you have any of these conditions:  diabetes (high blood sugar)  high blood pressure  low blood counts (white cells, platelets, red blood cells)  low levels of sodium in the blood  pheochromocytoma  seizures  thyroid disease  an unusual or allergic reaction to linezolid, other antibiotics or medicines, foods, dyes, or preservatives  pregnant or trying to get pregnant  breast-feeding How should I use this medicine? Take this medicine by mouth. Take it as directed on the prescription label at the same time every day. You can take it with or without food. If it upsets your stomach, take it with food. Take all of this medicine unless your health care provider tells you to stop it early.  Keep taking it even if you think you are better. Talk to your health care provider about the use of this medicine in children. While it may be prescribed for children for selected conditions, precautions do apply. Overdosage: If you think you have taken too much of this medicine contact a poison control center or emergency room at once. NOTE: This medicine is only for you. Do not share this medicine with others. What if I miss a dose? If you miss a dose, take it as soon as you can. If it is almost time for your next dose, take only that dose. Do not take double or extra doses. What may interact with this medicine? Do not take this medicine with any of the following medications:  certain medicines for depression, anxiety, or psychotic disturbances  codeine  dihydrocodeine  fenfluramine  MAOIs like Marplan, Nardil, and Parnate  meperidine  methyldopa  morphine  ozanimod  procarbazine  St. John's wort  stimulant medicines for attention disorders, weight loss, or to stay awake  tapentadol  tramadol This medicine may also interact with the following medications:  birth control pills  certain medicines for blood pressure,  heart disease, irregular heart beat  certain medicines for migraine headache like almotriptan, eletriptan, frovatriptan, naratriptan, rizatriptan, sumatriptan, zolmitriptan  other narcotic medicines for pain  phenylpropanolamine  pseudoephedrine This list may not describe all possible interactions. Give your health care provider a list of all the medicines, herbs, non-prescription drugs, or dietary supplements you use. Also tell them if you smoke, drink alcohol, or use illegal drugs. Some items may interact with your medicine. What should I watch for while using this medicine? Tell your health care provider if your symptoms do not start to get better or if they get worse. Do not treat diarrhea with over the counter products. Contact your health care  provider if you have diarrhea that lasts more than 2 days or if it is severe and watery. You may need to be on a special diet while you are taking this medicine. Ask your health care provider. What side effects may I notice from receiving this medicine? Side effects that you should report to your doctor or health care professional as soon as possible:  allergic reactions (skin rash, itching or hives; swelling of the face, lips, or tongue)  bloody or watery diarrhea  changes in vision  fever  high lactic acid levels (trouble breathing; nausea or vomiting; unusual stomach upset or pain)  infection (fever, chills, cough, sore throat, pain or trouble passing urine)  low blood sugar (feeling anxious; confusion; dizziness; increased hunger; unusually weak or tired; increased sweating; shakiness; cold, clammy skin; irritable; headache; blurred vision; fast heartbeat; loss of consciousness)  low red blood cell counts (trouble breathing; feeling faint; lightheaded, falls; unusually weak or tired)  low sodium level (muscle weakness, fatigue, dizziness, headache, confusion)  pain, tingling, numbness in the hands or feet  seizures  serotonin syndrome (irritable; confusion; diarrhea; fast or irregular heartbeat; muscle twitching; stiff muscles; trouble walking; sweating; high fever; seizures; chills; vomiting)  unusual bruising or bleeding  unusually weak or tired Side effects that usually do not require medical attention (report to your doctor or health care professional if they continue or are bothersome):  diarrhea  dizziness  headache  nausea, vomiting This list may not describe all possible side effects. Call your doctor for medical advice about side effects. You may report side effects to FDA at 1-800-FDA-1088. Where should I keep my medicine? Keep out of the reach of children and pets. Store at room temperature at 25 degrees C (77 degrees F). Protect from light and moisture.  Keep the container tightly closed. Get rid of any unused medicine after the expiration date. To get rid of medicines that are no longer needed or have expired:  Take the medicine to a medicine take-back program. Check with your pharmacy or law enforcement to find a location.  If you cannot return the medicine, check the label or package insert to see if the medicine should be thrown out in the garbage or flushed down the toilet. If you are not sure, ask your health care provider. If it is safe to put it in the trash, empty the medicine out of the container. Mix the medicine with cat litter, dirt, coffee grounds, or other unwanted substance. Seal the mixture in a bag or container. Put it in the trash. NOTE: This sheet is a summary. It may not cover all possible information. If you have questions about this medicine, talk to your doctor, pharmacist, or health care provider.  2021 Elsevier/Gold Standard (2020-05-05 11:14:02)

## 2020-09-29 NOTE — TOC Transition Note (Signed)
Transition of Care [facility]) - CM/SW Discharge Note   Patient Details  Name: [name] MRN: [id number] Date of Birth: [birth year]  Transition of Care [facility]) CM/SW Contact:  Candie Chroman, LCSW Phone Number: 09/29/2020, 10:31 AM   Clinical Narrative: Patient has orders to discharge home today. EMS transport has been arranged. Notified patient that [facility] accepted her for home health and added their information the AVS. No further concerns. CSW signing off.    Final next level of care: Home w Home Health Services Barriers to Discharge: No Barriers Identified   Patient Goals and CMS Choice   CMS Medicare.gov Compare Post Acute Care list provided to:: Patient Choice offered to / list presented to : Patient  Discharge Placement                Patient to be transferred to facility by: EMS   Patient and family notified of of transfer: 09/29/20  Discharge Plan and Services     Post Acute Care Choice: [place]: RN,PT,OT,Nurse's Aide [place]: [place] Date [place]: 09/29/20   Representative spoke with at [place]: Adela Lank  Social Determinants of Health (SDOH) Interventions     Readmission Risk Interventions No flowsheet data found.

## 2020-09-29 NOTE — TOC Initial Note (Addendum)
Transition of Care [facility]) - Initial/Assessment Note    Patient Details  Name: [name] MRN: [id number] Date of Birth: [birth year]  Transition of Care [facility]) CM/SW Contact:    Candie Chroman, LCSW Phone Number: 09/29/2020, 9:41 AM  Clinical Narrative:  CSW met with patient. No supports at bedside. CSW introduced role and explained that discharge planning would be discussed. Patient is agreeable to home health services. Reviewed CMS scores for agencies that serve her zip code and accept [employer]. [facility] is first preference. Sent referral information to [facility] representative to review. No DME needs. Patient has a quad cane and rolling walker at home. Patient will need EMS transport home and says she has her keys to get inside. No further concerns. CSW encouraged patient to contact CSW as needed. CSW will continue to follow patient for support and facilitate return home today.                10:00 am: [facility] has accepted home health referral.  Expected Discharge Plan: [place] Barriers to Discharge: No Barriers Identified   Patient Goals and CMS Choice   CMS Medicare.gov Compare Post Acute Care list provided to:: Patient    Expected Discharge Plan and Services Expected Discharge Plan: [place] Choice: [place] arrangements for the past 2 months: Single Family Home Expected Discharge Date: 09/29/20                                    Prior Living Arrangements/Services Living arrangements for the past 2 months: Single Family Home Lives with:: Self Patient language and need for interpreter reviewed:: Yes Do you feel safe going back to the place where you live?: Yes      Need for Family Participation in Patient Care: Yes (Comment) Care giver support system in place?: Yes (comment) Current home services: DME Criminal Activity/Legal Involvement Pertinent to Current Situation/Hospitalization: No - Comment  as needed  Activities of Daily Living Home Assistive Devices/Equipment: Cane (specify quad or straight),Walker (specify type),Eyeglasses ADL Screening (condition at time of admission) Patient's cognitive ability adequate to safely complete daily activities?: Yes Is the patient deaf or have difficulty hearing?: No Does the patient have difficulty seeing, even when wearing glasses/contacts?: No Does the patient have difficulty concentrating, remembering, or making decisions?: No Patient able to express need for assistance with ADLs?: Yes Does the patient have difficulty dressing or bathing?: Yes Independently performs ADLs?: Yes (appropriate for developmental age) Does the patient have difficulty walking or climbing stairs?: Yes Weakness of Legs: Both Weakness of Arms/Hands: None  Permission Sought/Granted Permission sought to share information with : Facility Art therapist granted to share information with : Yes, Verbal Permission Granted     Permission granted to share info w AGENCY: Home Health Agencies        Emotional Assessment Appearance:: Appears stated age Attitude/Demeanor/Rapport: Engaged,Gracious Affect (typically observed): Accepting,Appropriate,Calm,Pleasant Orientation: : Oriented to Self,Oriented to Place,Oriented to  Time,Oriented to Situation Alcohol / Substance Use: Not Applicable Psych Involvement: No (comment)  Admission diagnosis:  Fluid excess [E87.70] Acute respiratory failure with hypoxia ([place]) [J96.01] Sepsis ([place]) [A41.9] Cellulitis, unspecified cellulitis site [L03.90] Sepsis, due to unspecified organism, unspecified whether acute organ dysfunction present [facility]) [A41.9] Patient Active Problem List   Diagnosis Date Noted  . DNR (do not resuscitate)   . Lactic acidosis   .  AKI (acute kidney injury) ([place])   . Bilateral lower leg cellulitis 04/15/2020  . Anemia   . Cellulitis of left lower extremity 11/03/2018  . Sepsis ([place])  04/17/2018  . Chest pain with high risk for cardiac etiology 02/04/2017  . Heart palpitations 02/04/2017  . SOB (shortness of breath) on exertion 02/04/2017  . OSA (obstructive sleep apnea) 08/04/2016  . HTN (hypertension) 08/04/2016  . Asthma 08/04/2016  . Screening for colon cancer 05/17/2015  . Cellulitis 01/24/2015  . Cellulitis of right leg 01/23/2015  . Skin rash 12/28/2013  . Tobacco use 12/28/2013  . Spinal stenosis 07/11/2011  . Cataracts, bilateral 05/30/2010  . History of colonic polyps 12/28/2009  . Lymphedema 02/10/2009  . Idiopathic urticaria 02/10/2009  . Morbid obesity with BMI of 60.0-69.9, adult ([place]) 02/10/2009  . Other abnormal glucose 02/10/2009  . Essential hypertension 02/10/2009   PCP:  Ranae Plumber, [place] Pharmacy:   [id number] [address], [place] [address] Phone: [phone] Fax: [phone]  [place], [place] [address] [place] [address] Phone: [phone] Fax: [phone]     Social Determinants of Health (SDOH) Interventions    Readmission Risk Interventions No flowsheet data found.

## 2020-09-29 NOTE — Care Management Important Message (Signed)
Important Message  Patient Details  Name: [name] MRN: [id number] Date of Birth: [birth year]   Medicare Important Message Given:  Yes     [name] 09/29/2020, 10:04 AM

## 2020-09-29 NOTE — Discharge Summary (Addendum)
Physician Discharge Summary  Marvis [name] FBP:[id number] DOB: 1956-04-14 DOA: 09/24/2020  PCP: Ranae Plumber, PA  Admit date: 09/24/2020 Discharge date: 09/29/2020  Admitted From: Home Disposition: Home with home health  Recommendations for Outpatient Follow-up:  1. Follow up with PCP in 1-2 weeks 2. Follow-up wound clinic  Home Health: Yes Equipment/Devices: No Discharge Condition: Stable CODE STATUS: DNR Diet recommendation: Heart Healthy / Carb Modified  Brief/Interim Summary: 65 y.o.femalewith hx oflymphedema and recurrent lower extremity cellulitis, OSA, hypertension, asthma, morbid obesity, who presents with lower extremity pain concern for cellulitis.  Patient reports she has had longstanding lymphedema as well as recurrent bouts of cellulitis in her shins. She reports that she started having problems about 2 weeks ago and had been prescribed Bactrim which she had been taking consistently without any missed doses. Earlier today she felt very ill, she has had sepsis before and reported that she felt exactly the same as the last time she had gotten sepsis from a skin infection so she decided to present to the ED. No other symptoms besides a general feeling of unwellness.  In the EDvital signs notable for fever to 102.3 Fahrenheit and tachycardia to the low 100s, as well as hypertension.  Given multiple failed trials of antibiotics infectious diseases consulted for recommendations.  Patient is known to the infectious disease service.  Started on IV Zyvox.  Symptomatic cellulitis is beginning to improve.  On the day of discharge cellulitic change continue to improve.  Seen by infectious disease the day prior to discharge.  Will discharge on p.o. Zyvox 600 mg twice daily times 7 additional days.  Patient will follow up in the wound clinic.  Sepsis physiology resolved at time of discharge.  Patient discharged in stable condition.  Home health services ordered  Discharge  Diagnoses:  Active Problems:   Lymphedema   OSA (obstructive sleep apnea)   HTN (hypertension)   Asthma   Sepsis ([place])   Morbid obesity with BMI of 60.0-69.9, adult (H[facility])   Essential hypertension   Bilateral lower leg cellulitis   Lactic acidosis   AKI (acute kidney injury) ([place])   DNR (do not resuscitate)   1. Severe sepsis, present on admission. Patient had fever tachycardia and right thigh cellulitis with chronic lower extremity lymphedema and erythema.  Started on Rocephin and Zyvox.  After infectious disease evaluation Rocephin discontinued due to lack of purulence.    Appears to be resolving with Zyvox therapy.  Was on IV Zyvox in house and transition to oral at time of discharge.  Will complete additional 7 days.  Appreciate infectious disease follow-up.  Patient will discharge home on p.o. Zyvox and will follow up in the wound clinic.   2. Mild lactic acidosis, resolved.  Likely secondary to sepsis 3. Chronic lymphedema. Continue lymphedema treatments as outpatient. 4. Acute kidney injury. Patient was on Bactrim as outpatient. Creatinine 1.20 on presentation.  AKI resolved.  Bactrim discontinued 5. Morbid obesity with a BMI of 65.37.  This complicates overall care 6.   Essential hypertension.     Can resume home regimen  Discharge Instructions  Discharge Instructions    Diet - low sodium heart healthy   Complete by: As directed    Increase activity slowly   Complete by: As directed      Allergies as of 09/29/2020      Reactions   Vancomycin Itching, Nausea And Vomiting   Ace Inhibitors Hives, Swelling   Ibuprofen Hives, Swelling      Medication  List    STOP taking these medications   sulfamethoxazole-trimethoprim 800-160 MG tablet Commonly known as: BACTRIM DS     TAKE these medications   acetaminophen 500 MG tablet Commonly known as: TYLENOL Take 500-1,000 mg by mouth every 6 (six) hours as needed for mild pain or fever.   atenolol 50 MG tablet Commonly  known as: TENORMIN Take 50 mg by mouth daily.   furosemide 20 MG tablet Commonly known as: LASIX Take 1 tablet (20 mg total) by mouth daily. What changed: how much to take   linezolid 600 MG tablet Commonly known as: ZYVOX Take 1 tablet (600 mg total) by mouth every 12 (twelve) hours for 7 days.   oxyCODONE 5 MG immediate release tablet Commonly known as: Oxy IR/ROXICODONE Take 1 tablet (5 mg total) by mouth every 6 (six) hours as needed for up to 3 days for moderate pain.       Follow-up Information    Ranae Plumber, [place]. Go on 10/05/2020.   Specialty: Family Medicine Why: @ 1:40 pm Contact information: [place] [address] [place], [facility] Follow up.   Specialty: Home Health Services Why: They will follow up with you for your home health needs. Contact information: [place] [address] [phone]              Allergies  Allergen Reactions  . Vancomycin Itching and Nausea And Vomiting  . Ace Inhibitors Hives and Swelling  . Ibuprofen Hives and Swelling    Consultations:  ID   Procedures/Studies: DG Chest Port 1 View  Result Date: 09/24/2020 CLINICAL DATA:  Fluid excess. Additional history provided: Recurring cellulitis on legs, fever, nausea, vomiting. EXAM: PORTABLE CHEST 1 VIEW COMPARISON:  Prior chest radiographs 04/12/2019 and earlier. FINDINGS: Heart size within normal limits. Central pulmonary vascular congestion. Additionally, there is prominent of the interstitial lung markings bilaterally likely reflecting interstitial edema. No sizable pleural effusion or evidence of pneumothorax. No evidence of acute bony abnormality. IMPRESSION: Central pulmonary vascular congestion. Associated prominence of the interstitial lung markings bilaterally likely reflecting interstitial edema. Electronically Signed   By: Kellie Simmering DO   On: 09/24/2020 15:35    (Echo, Carotid, EGD,  Colonoscopy, ERCP)    Subjective: Patient seen and examined the day of discharge.  Stable, no distress.  Cellulitic change improving.  Sepsis physiology resolved.  Pain well controlled.  Stable for discharge home at this time.  Discharge Exam: Vitals:   09/29/20 0434 09/29/20 0825  BP: 135/69 (!) 151/83  Pulse: 62 62  Resp: 18 18  Temp: 98.5 F (36.9 C) 97.9 F (36.6 C)  SpO2: 97% 100%   Vitals:   09/28/20 2044 09/28/20 2046 09/29/20 0434 09/29/20 0825  BP:  135/69 135/69 (!) 151/83  Pulse: 61 65 62 62  Resp: 17 17 18 18   Temp: 98.3 F (36.8 C) 97.9 F (36.6 C) 98.5 F (36.9 C) 97.9 F (36.6 C)  TempSrc: Oral Oral  Oral  SpO2: 97% 95% 97% 100%  Weight:      Height:        General: Pt is alert, awake, not in acute distress Cardiovascular: RRR, S1/S2 +, no rubs, no gallops Respiratory: CTA bilaterally, no wheezing, no rhonchi Abdominal: Obese, soft, nontender, nondistended, normal bowel sounds Extremities: Marked bilateral lower extremity lymphedema with improving cellulitic change    The results of significant diagnostics from this hospitalization (including imaging, microbiology,  ancillary and laboratory) are listed below for reference.     Microbiology: Recent Results (from the past 240 hour(s))  Culture, blood (Routine x 2)     Status: None   Collection Time: 09/24/20  2:57 PM   Specimen: BLOOD  Result Value Ref Range Status   Specimen Description BLOOD BLOOD RIGHT HAND  Final   Special Requests   Final    BOTTLES DRAWN AEROBIC AND ANAEROBIC Blood Culture results may not be optimal due to an inadequate volume of blood received in culture bottles   Culture   Final    NO GROWTH 5 DAYS Performed at [facility], [address] [place], [place] [address]    Report Status 09/29/2020 FINAL  Final  Culture, blood (Routine x 2)     Status: None   Collection Time: 09/24/20  2:57 PM   Specimen: BLOOD  Result Value Ref Range Status   Specimen  Description BLOOD BLOOD RIGHT FOREARM  Final   Special Requests   Final    BOTTLES DRAWN AEROBIC AND ANAEROBIC Blood Culture results may not be optimal due to an inadequate volume of blood received in culture bottles   Culture   Final    NO GROWTH 5 DAYS Performed at [facility], [address] [place], [place] [address]    Report Status 09/29/2020 FINAL  Final  Resp Panel by RT-PCR (Flu A&B, Covid) Nasopharyngeal Swab     Status: None   Collection Time: 09/24/20  2:57 PM   Specimen: Nasopharyngeal Swab; Nasopharyngeal(NP) swabs in vial transport medium  Result Value Ref Range Status   SARS Coronavirus 2 by RT PCR NEGATIVE NEGATIVE Final    Comment: (NOTE) SARS-CoV-2 target nucleic acids are NOT DETECTED.  The SARS-CoV-2 RNA is generally detectable in upper respiratory specimens during the acute phase of infection. The lowest concentration of SARS-CoV-2 viral copies this assay can detect is 138 copies/mL. A negative result does not preclude SARS-Cov-2 infection and should not be used as the sole basis for treatment or other patient management decisions. A negative result may occur with  improper specimen collection/handling, submission of specimen other than nasopharyngeal swab, presence of viral mutation(s) within the areas targeted by this assay, and inadequate number of viral copies(<138 copies/mL). A negative result must be combined with clinical observations, patient history, and epidemiological information. The expected result is Negative.  Fact Sheet for Patients:  EntrepreneurPulse.com.au  Fact Sheet for Healthcare Providers:  IncredibleEmployment.be  This test is no t yet approved or cleared by the [place] FDA and  has been authorized for detection and/or diagnosis of SARS-CoV-2 by FDA under an Emergency Use Authorization (EUA). This EUA will remain  in effect (meaning this test can be used) for the duration of  the COVID-19 declaration under Section 564(b)(1) of the Act, 21 U.S.C.section 360bbb-3(b)(1), unless the authorization is terminated  or revoked sooner.       Influenza A by PCR NEGATIVE NEGATIVE Final   Influenza B by PCR NEGATIVE NEGATIVE Final    Comment: (NOTE) The Xpert Xpress SARS-CoV-2/FLU/RSV plus assay is intended as an aid in the diagnosis of influenza from Nasopharyngeal swab specimens and should not be used as a sole basis for treatment. Nasal washings and aspirates are unacceptable for Xpert Xpress SARS-CoV-2/FLU/RSV testing.  Fact Sheet for Patients: EntrepreneurPulse.com.au  Fact Sheet for Healthcare Providers: IncredibleEmployment.be  This test is not yet approved or cleared by the [place] FDA and has been authorized for detection and/or diagnosis  of SARS-CoV-2 by FDA under an Emergency Use Authorization (EUA). This EUA will remain in effect (meaning this test can be used) for the duration of the COVID-19 declaration under Section 564(b)(1) of the Act, 21 U.S.C. section 360bbb-3(b)(1), unless the authorization is terminated or revoked.  Performed at [facility], [address] [place], [place] [address]      Labs: BNP (last 3 results) Recent Labs    04/15/20 1256 09/24/20 1457  BNP 77.1 130.8*   Basic Metabolic Panel: Recent Labs  Lab 09/24/20 1457 09/25/20 0555 09/26/20 0442 09/27/20 0633 09/29/20 0751  NA 135 134* 135 137 137  K 4.7 4.1 4.0 4.4 4.3  CL 102 103 104 104 100  CO2 24 24 24 27 29   GLUCOSE 99 95 102* 87 94  BUN 21 19 19 15 16   CREATININE 1.20* 1.18* 1.05* 0.89 0.93  CALCIUM 9.0 8.2* 8.2* 8.6* 8.7*   Liver Function Tests: Recent Labs  Lab 09/24/20 1457 09/25/20 0555  AST 21 14*  ALT 12 10  ALKPHOS 117 82  BILITOT 0.9 0.8  PROT 9.1* 6.9  ALBUMIN 3.7 2.7*   No results for input(s): LIPASE, AMYLASE in the last 168 hours. No results for input(s): AMMONIA in the last  168 hours. CBC: Recent Labs  Lab 09/24/20 1457 09/25/20 0555 09/26/20 0442 09/27/20 0633 09/29/20 0751  WBC 15.1* 12.4* 7.2 5.4 5.3  NEUTROABS 13.7*  --   --   --  2.5  HGB 12.7 10.0* 9.8* 10.1* 11.0*  HCT 40.3 31.9* 32.3* 33.0* 35.4*  MCV 83.1 84.2 85.0 84.2 83.7  PLT 165 144* 144* 156 166   Cardiac Enzymes: No results for input(s): CKTOTAL, CKMB, CKMBINDEX, TROPONINI in the last 168 hours. BNP: Invalid input(s): POCBNP CBG: No results for input(s): GLUCAP in the last 168 hours. D-Dimer No results for input(s): DDIMER in the last 72 hours. Hgb A1c No results for input(s): HGBA1C in the last 72 hours. Lipid Profile No results for input(s): CHOL, HDL, LDLCALC, TRIG, CHOLHDL, LDLDIRECT in the last 72 hours. Thyroid function studies No results for input(s): TSH, T4TOTAL, T3FREE, THYROIDAB in the last 72 hours.  Invalid input(s): FREET3 Anemia work up No results for input(s): VITAMINB12, FOLATE, FERRITIN, TIBC, IRON, RETICCTPCT in the last 72 hours. Urinalysis    Component Value Date/Time   COLORURINE YELLOW (A) 09/24/2020 1457   APPEARANCEUR HAZY (A) 09/24/2020 1457   APPEARANCEUR Clear 05/18/2013 1725   LABSPEC 1.020 09/24/2020 1457   LABSPEC 1.011 05/18/2013 1725   PHURINE 5.0 09/24/2020 1457   GLUCOSEU NEGATIVE 09/24/2020 1457   GLUCOSEU Negative 05/18/2013 1725   HGBUR NEGATIVE 09/24/2020 1457   BILIRUBINUR NEGATIVE 09/24/2020 1457   BILIRUBINUR Negative 05/18/2013 [place] 09/24/2020 1457   PROTEINUR NEGATIVE 09/24/2020 1457   NITRITE NEGATIVE 09/24/2020 1457   LEUKOCYTESUR NEGATIVE 09/24/2020 1457   LEUKOCYTESUR Negative 05/18/2013 1725   Sepsis Labs Invalid input(s): PROCALCITONIN,  WBC,  LACTICIDVEN Microbiology Recent Results (from the past 240 hour(s))  Culture, blood (Routine x 2)     Status: None   Collection Time: 09/24/20  2:57 PM   Specimen: BLOOD  Result Value Ref Range Status   Specimen Description BLOOD BLOOD RIGHT HAND   Final   Special Requests   Final    BOTTLES DRAWN AEROBIC AND ANAEROBIC Blood Culture results may not be optimal due to an inadequate volume of blood received in culture bottles   Culture   Final    NO GROWTH  5 DAYS Performed at [facility], [address] [place], [place] [address]    Report Status 09/29/2020 FINAL  Final  Culture, blood (Routine x 2)     Status: None   Collection Time: 09/24/20  2:57 PM   Specimen: BLOOD  Result Value Ref Range Status   Specimen Description BLOOD BLOOD RIGHT FOREARM  Final   Special Requests   Final    BOTTLES DRAWN AEROBIC AND ANAEROBIC Blood Culture results may not be optimal due to an inadequate volume of blood received in culture bottles   Culture   Final    NO GROWTH 5 DAYS Performed at [facility], [address] [place], [place] [address]    Report Status 09/29/2020 FINAL  Final  Resp Panel by RT-PCR (Flu A&B, Covid) Nasopharyngeal Swab     Status: None   Collection Time: 09/24/20  2:57 PM   Specimen: Nasopharyngeal Swab; Nasopharyngeal(NP) swabs in vial transport medium  Result Value Ref Range Status   SARS Coronavirus 2 by RT PCR NEGATIVE NEGATIVE Final    Comment: (NOTE) SARS-CoV-2 target nucleic acids are NOT DETECTED.  The SARS-CoV-2 RNA is generally detectable in upper respiratory specimens during the acute phase of infection. The lowest concentration of SARS-CoV-2 viral copies this assay can detect is 138 copies/mL. A negative result does not preclude SARS-Cov-2 infection and should not be used as the sole basis for treatment or other patient management decisions. A negative result may occur with  improper specimen collection/handling, submission of specimen other than nasopharyngeal swab, presence of viral mutation(s) within the areas targeted by this assay, and inadequate number of viral copies(<138 copies/mL). A negative result must be combined with clinical observations, patient history, and  epidemiological information. The expected result is Negative.  Fact Sheet for Patients:  EntrepreneurPulse.com.au  Fact Sheet for Healthcare Providers:  IncredibleEmployment.be  This test is no t yet approved or cleared by the [place] FDA and  has been authorized for detection and/or diagnosis of SARS-CoV-2 by FDA under an Emergency Use Authorization (EUA). This EUA will remain  in effect (meaning this test can be used) for the duration of the COVID-19 declaration under Section 564(b)(1) of the Act, 21 U.S.C.section 360bbb-3(b)(1), unless the authorization is terminated  or revoked sooner.       Influenza A by PCR NEGATIVE NEGATIVE Final   Influenza B by PCR NEGATIVE NEGATIVE Final    Comment: (NOTE) The Xpert Xpress SARS-CoV-2/FLU/RSV plus assay is intended as an aid in the diagnosis of influenza from Nasopharyngeal swab specimens and should not be used as a sole basis for treatment. Nasal washings and aspirates are unacceptable for Xpert Xpress SARS-CoV-2/FLU/RSV testing.  Fact Sheet for Patients: EntrepreneurPulse.com.au  Fact Sheet for Healthcare Providers: IncredibleEmployment.be  This test is not yet approved or cleared by the [place] FDA and has been authorized for detection and/or diagnosis of SARS-CoV-2 by FDA under an Emergency Use Authorization (EUA). This EUA will remain in effect (meaning this test can be used) for the duration of the COVID-19 declaration under Section 564(b)(1) of the Act, 21 U.S.C. section 360bbb-3(b)(1), unless the authorization is terminated or revoked.  Performed at [facility], [address] [place], [place] [address]      Time coordinating discharge: Over 30 minutes  SIGNED:   Sidney Ace, MD  [facility] 09/29/2020, 10:19 AM Pager   If 7PM-7AM, please contact night-coverage

## 2020-12-23 ENCOUNTER — Emergency Department: Payer: Medicare HMO

## 2020-12-23 ENCOUNTER — Encounter: Payer: Self-pay | Admitting: Emergency Medicine

## 2020-12-23 ENCOUNTER — Other Ambulatory Visit: Payer: Self-pay

## 2020-12-23 ENCOUNTER — Inpatient Hospital Stay
Admission: EM | Admit: 2020-12-23 | Discharge: 2020-12-25 | DRG: 872 | Disposition: A | Discharge status: Home Health | Payer: Medicare HMO | Attending: Internal Medicine | Admitting: Internal Medicine

## 2020-12-23 DIAGNOSIS — Z87891 Personal history of nicotine dependence: Secondary | ICD-10-CM

## 2020-12-23 DIAGNOSIS — G4733 Obstructive sleep apnea (adult) (pediatric): Secondary | ICD-10-CM | POA: Diagnosis present

## 2020-12-23 DIAGNOSIS — Z20822 Contact with and (suspected) exposure to covid-19: Secondary | ICD-10-CM | POA: Diagnosis present

## 2020-12-23 DIAGNOSIS — R6520 Severe sepsis without septic shock: Secondary | ICD-10-CM

## 2020-12-23 DIAGNOSIS — A419 Sepsis, unspecified organism: Principal | ICD-10-CM | POA: Diagnosis present

## 2020-12-23 DIAGNOSIS — I10 Essential (primary) hypertension: Secondary | ICD-10-CM | POA: Diagnosis present

## 2020-12-23 DIAGNOSIS — I890 Lymphedema, not elsewhere classified: Secondary | ICD-10-CM | POA: Diagnosis present

## 2020-12-23 DIAGNOSIS — J45909 Unspecified asthma, uncomplicated: Secondary | ICD-10-CM | POA: Diagnosis present

## 2020-12-23 DIAGNOSIS — Z79899 Other long term (current) drug therapy: Secondary | ICD-10-CM

## 2020-12-23 DIAGNOSIS — L03115 Cellulitis of right lower limb: Secondary | ICD-10-CM | POA: Diagnosis present

## 2020-12-23 DIAGNOSIS — R110 Nausea: Secondary | ICD-10-CM | POA: Diagnosis present

## 2020-12-23 DIAGNOSIS — M549 Dorsalgia, unspecified: Secondary | ICD-10-CM | POA: Diagnosis present

## 2020-12-23 DIAGNOSIS — Z803 Family history of malignant neoplasm of breast: Secondary | ICD-10-CM

## 2020-12-23 DIAGNOSIS — Z6841 Body Mass Index (BMI) 40.0 and over, adult: Secondary | ICD-10-CM

## 2020-12-23 DIAGNOSIS — Z881 Allergy status to other antibiotic agents status: Secondary | ICD-10-CM

## 2020-12-23 DIAGNOSIS — M1990 Unspecified osteoarthritis, unspecified site: Secondary | ICD-10-CM | POA: Diagnosis present

## 2020-12-23 DIAGNOSIS — Z888 Allergy status to other drugs, medicaments and biological substances status: Secondary | ICD-10-CM | POA: Diagnosis not present

## 2020-12-23 DIAGNOSIS — I509 Heart failure, unspecified: Secondary | ICD-10-CM

## 2020-12-23 LAB — CBC WITH DIFFERENTIAL/PLATELET
Abs Immature Granulocytes: 0.32 10*3/uL — ABNORMAL HIGH (ref 0.00–0.07)
Basophils Absolute: 0.1 10*3/uL (ref 0.0–0.1)
Basophils Relative: 0 %
Eosinophils Absolute: 0 10*3/uL (ref 0.0–0.5)
Eosinophils Relative: 0 %
HCT: 41.1 % (ref 36.0–46.0)
Hemoglobin: 13 g/dL (ref 12.0–15.0)
Immature Granulocytes: 2 %
Lymphocytes Relative: 4 %
Lymphs Abs: 0.8 10*3/uL (ref 0.7–4.0)
MCH: 26.9 pg (ref 26.0–34.0)
MCHC: 31.6 g/dL (ref 30.0–36.0)
MCV: 85.1 fL (ref 80.0–100.0)
Monocytes Absolute: 1.2 10*3/uL — ABNORMAL HIGH (ref 0.1–1.0)
Monocytes Relative: 6 %
Neutro Abs: 17 10*3/uL — ABNORMAL HIGH (ref 1.7–7.7)
Neutrophils Relative %: 88 %
Platelets: 160 10*3/uL (ref 150–400)
RBC: 4.83 MIL/uL (ref 3.87–5.11)
RDW: 14.4 % (ref 11.5–15.5)
WBC: 19.3 10*3/uL — ABNORMAL HIGH (ref 4.0–10.5)
nRBC: 0 % (ref 0.0–0.2)

## 2020-12-23 LAB — COMPREHENSIVE METABOLIC PANEL
ALT: 15 U/L (ref 0–44)
AST: 25 U/L (ref 15–41)
Albumin: 3.6 g/dL (ref 3.5–5.0)
Alkaline Phosphatase: 131 U/L — ABNORMAL HIGH (ref 38–126)
Anion gap: 8 (ref 5–15)
BUN: 14 mg/dL (ref 8–23)
CO2: 24 mmol/L (ref 22–32)
Calcium: 8.6 mg/dL — ABNORMAL LOW (ref 8.9–10.3)
Chloride: 101 mmol/L (ref 98–111)
Creatinine, Ser: 1.11 mg/dL — ABNORMAL HIGH (ref 0.44–1.00)
GFR, Estimated: 55 mL/min — ABNORMAL LOW (ref >60)
Glucose, Bld: 111 mg/dL — ABNORMAL HIGH (ref 70–99)
Potassium: 4.2 mmol/L (ref 3.5–5.1)
Sodium: 133 mmol/L — ABNORMAL LOW (ref 135–145)
Total Bilirubin: 0.8 mg/dL (ref 0.3–1.2)
Total Protein: 8.4 g/dL — ABNORMAL HIGH (ref 6.5–8.1)

## 2020-12-23 LAB — POC SARS CORONAVIRUS 2 AG -  ED: SARS Coronavirus 2 Ag: NEGATIVE

## 2020-12-23 LAB — URINALYSIS, COMPLETE (UACMP) WITH MICROSCOPIC
Bilirubin Urine: NEGATIVE
Glucose, UA: NEGATIVE mg/dL
Hgb urine dipstick: NEGATIVE
Ketones, ur: NEGATIVE mg/dL
Leukocytes,Ua: NEGATIVE
Nitrite: NEGATIVE
Protein, ur: NEGATIVE mg/dL
Specific Gravity, Urine: 1.005 (ref 1.005–1.030)
pH: 7 (ref 5.0–8.0)

## 2020-12-23 LAB — LACTIC ACID, PLASMA
Lactic Acid, Venous: 2.9 mmol/L (ref 0.5–1.9)
Lactic Acid, Venous: 2.4 mmol/L (ref 0.5–1.9)

## 2020-12-23 LAB — PROTIME-INR
INR: 1.1 (ref 0.8–1.2)
Prothrombin Time: 14.6 seconds (ref 11.4–15.2)

## 2020-12-23 MED ORDER — ATENOLOL 25 MG PO TABS
50.0000 mg | ORAL_TABLET | Freq: Every day | ORAL | Status: DC
  Administered 2020-12-24: 50 mg via ORAL
  Filled 2020-12-23: qty 2

## 2020-12-23 MED ORDER — ACETAMINOPHEN 650 MG RE SUPP: 650.0000 mg | Freq: Four times a day (QID) | RECTAL | Status: DC | PRN

## 2020-12-23 MED ORDER — ONDANSETRON HCL 4 MG PO TABS: 4.0000 mg | ORAL_TABLET | Freq: Four times a day (QID) | ORAL | Status: DC | PRN

## 2020-12-23 MED ORDER — ACETAMINOPHEN 325 MG PO TABS
650.0000 mg | ORAL_TABLET | Freq: Four times a day (QID) | ORAL | Status: DC | PRN
  Administered 2020-12-24 – 2020-12-25 (×4): 650 mg via ORAL
  Filled 2020-12-23 (×4): qty 2

## 2020-12-23 MED ORDER — ONDANSETRON HCL 4 MG/2ML IJ SOLN: 4.0000 mg | Freq: Four times a day (QID) | INTRAMUSCULAR | Status: DC | PRN

## 2020-12-23 MED ORDER — TRAZODONE HCL 50 MG PO TABS: 25.0000 mg | ORAL_TABLET | Freq: Every evening | ORAL | Status: DC | PRN

## 2020-12-23 MED ORDER — SODIUM CHLORIDE 0.9 % IV SOLN: 2.0000 g | INTRAVENOUS | Status: DC

## 2020-12-23 MED ORDER — SODIUM CHLORIDE 0.9 % IV SOLN
2.0000 g | INTRAVENOUS | Status: DC
  Administered 2020-12-23 – 2020-12-25 (×2): 2 g via INTRAVENOUS
  Filled 2020-12-23: qty 2
  Filled 2020-12-23 (×2): qty 20

## 2020-12-23 MED ORDER — MAGNESIUM HYDROXIDE 400 MG/5ML PO SUSP: 30.0000 mL | Freq: Every day | ORAL | Status: DC | PRN

## 2020-12-23 MED ORDER — ENOXAPARIN SODIUM 100 MG/ML IJ SOSY
0.5000 mg/kg | PREFILLED_SYRINGE | INTRAMUSCULAR | Status: DC
  Administered 2020-12-24: 82.5 mg via SUBCUTANEOUS
  Filled 2020-12-23 (×2): qty 0.82

## 2020-12-23 MED ORDER — SODIUM CHLORIDE 0.9 % IV SOLN: INTRAVENOUS | Status: DC

## 2020-12-23 MED ORDER — SODIUM CHLORIDE 0.9 % IV BOLUS
1000.0000 mL | Freq: Once | INTRAVENOUS | Status: AC
  Administered 2020-12-23: 1000 mL via INTRAVENOUS

## 2020-12-23 MED ORDER — KETOROLAC TROMETHAMINE 30 MG/ML IJ SOLN
15.0000 mg | INTRAMUSCULAR | Status: AC
  Administered 2020-12-23: 15 mg via INTRAVENOUS
  Filled 2020-12-23: qty 1

## 2020-12-23 NOTE — H&P (Signed)
[place]   PATIENT NAME: [name]    MR#:  [id number]  DATE OF BIRTH:  12/15/1955  DATE OF ADMISSION:  12/23/2020  PRIMARY CARE PHYSICIAN: Ranae Plumber, PA   Patient is coming from: Home  REQUESTING/REFERRING PHYSICIAN: Brenton Grills, MD  CHIEF COMPLAINT:   Chief Complaint  Patient presents with  . Fever  . Generalized Body Aches    HISTORY OF PRESENT ILLNESS:  Temima Kutsch is a 65 y.o. female with medical history significant for asthma, osteoarthritis, hypertension, lymphedema, morbid obesity, obstructive sleep apnea, who presented to the ER with acute onset of worsening right medial thigh swelling erythema and tenderness as well as pain extending to her popliteal area.  She admitted to nausea without vomiting.  She had positive tactile fever and chills.  She denied any chest pain or dyspnea or palpitations.  She admitted to mild abdominal soreness and back pain.  She has been having diminished urine output.  No dysuria or hematuria or flank pain.  No cough or wheezing or hemoptysis. ED Course: When she came to the ER temperature was 102 with respiratory rate of 21 and vital signs otherwise were within normal.  Labs revealed leukocytosis of 19.3 with neutrophilia, lactic acid 2.9 later 2.4 and CMP was unremarkable.  Influenza antigens and COVID-19 PCR came back negative.  UA was unremarkable.  2 blood cultures were drawn.   Imaging: Portable chest x-ray showed cardiomegaly with mild vascular congestion and probable mild edema.  The patient was given 2 g of IV Rocephin and 50 mg of IV Toradol as well as 1 L bolus of IV normal saline.  She will be admitted to a medical monitored bed for further evaluation and management. PAST MEDICAL HISTORY:   Past Medical History:  Diagnosis Date  . Arthritis   . Asthma   . Enlarged heart   . Hypertension   . Lymphedema   . Sleep apnea   . Spinal stenosis     PAST SURGICAL HISTORY:   Past Surgical History:   Procedure Laterality Date  . COLONOSCOPY WITH PROPOFOL N/A 03/12/2018   Procedure: COLONOSCOPY WITH PROPOFOL;  Surgeon: Manya Silvas, MD;  Location: [facility] ENDOSCOPY;  Service: Endoscopy;  Laterality: N/A;  . NO PAST SURGERIES      SOCIAL HISTORY:   Social History   Tobacco Use  . Smoking status: Former    Packs/day: 0.25    Pack years: 0.00    Types: Cigarettes    Start date: 07/09/1976    Quit date: 07/09/1990    Years since quitting: 30.4  . Smokeless tobacco: Current    Types: Snuff  Substance Use Topics  . Alcohol use: No    FAMILY HISTORY:   Family History  Problem Relation Age of Onset  . Thyroid disease Other   . Breast cancer Maternal Aunt     DRUG ALLERGIES:   Allergies  Allergen Reactions  . Vancomycin Itching and Nausea And Vomiting  . Ace Inhibitors Hives and Swelling  . Ibuprofen Hives and Swelling    REVIEW OF SYSTEMS:   ROS As per history of present illness. All pertinent systems were reviewed above. Constitutional, HEENT, cardiovascular, respiratory, GI, GU, musculoskeletal, neuro, psychiatric, endocrine, integumentary and hematologic systems were reviewed and are otherwise negative/unremarkable except for positive findings mentioned above in the HPI.   MEDICATIONS AT HOME:   Prior to Admission medications   Medication Sig Start Date End Date Taking? Authorizing Provider  acetaminophen (TYLENOL)  500 MG tablet Take 500-1,000 mg by mouth every 6 (six) hours as needed for mild pain or fever.    [provider]  atenolol (TENORMIN) 50 MG tablet Take 50 mg by mouth daily. 02/29/20   [provider]  furosemide (LASIX) 20 MG tablet Take 1 tablet (20 mg total) by mouth daily. Patient taking differently: Take 40 mg by mouth daily. 06/07/19   Loletha Grayer, MD      VITAL SIGNS:  Blood pressure (!) 132/99, pulse 92, temperature (!) 101.1 F (38.4 C), temperature source Oral, resp. rate 20, height 5\' 3"  (1.6 m), weight (!) 167.4 kg,  SpO2 99 %.  PHYSICAL EXAMINATION:  Physical Exam  GENERAL:  65 y.o.-year-old female patient lying in the bed with no acute distress.  EYES: Pupils equal, round, reactive to light and accommodation. No scleral icterus. Extraocular muscles intact.  HEENT: Head atraumatic, normocephalic. Oropharynx and nasopharynx clear.  NECK:  Supple, no jugular venous distention. No thyroid enlargement, no tenderness.  LUNGS: Normal breath sounds bilaterally, no wheezing, rales,rhonchi or crepitation. No use of accessory muscles of respiration.  CARDIOVASCULAR: Regular rate and rhythm, S1, S2 normal. No murmurs, rubs, or gallops.  ABDOMEN: Soft, nondistended, nontender. Bowel sounds present. No organomegaly or mass.  EXTREMITIES: Positive bilateral lower extremity lymphedema with elephantiasis with no cyanosis, or clubbing.  NEUROLOGIC: Cranial nerves II through XII are intact. Muscle strength 5/5 in all extremities. Sensation intact. Gait not checked.  PSYCHIATRIC: The patient is alert and oriented x 3.  Normal affect and good eye contact. SKIN: Erythematous warm and tender medial right thigh extending to popliteal area.  No fluctuation with mild induration and lichenification.   LABORATORY PANEL:   CBC Recent Labs  Lab 12/23/20 2011  WBC 19.3*  HGB 13.0  HCT 41.1  PLT 160   ------------------------------------------------------------------------------------------------------------------  Chemistries  Recent Labs  Lab 12/23/20 2011  NA 133*  K 4.2  CL 101  CO2 24  GLUCOSE 111*  BUN 14  CREATININE 1.11*  CALCIUM 8.6*  AST 25  ALT 15  ALKPHOS 131*  BILITOT 0.8   ------------------------------------------------------------------------------------------------------------------  Cardiac Enzymes No results for input(s): TROPONINI in the last 168 hours. ------------------------------------------------------------------------------------------------------------------  RADIOLOGY:  DG  Chest Portable 1 View  Result Date: 12/23/2020 CLINICAL DATA:  Body ache and fever EXAM: PORTABLE CHEST 1 VIEW COMPARISON:  09/24/2020, 04/12/2019 FINDINGS: Cardiomegaly with vascular congestion and probable mild edema. No pleural effusion. No consolidation or pneumothorax. IMPRESSION: Cardiomegaly with vascular congestion and probable mild edema Electronically Signed   By: Donavan Foil M.D.   On: 12/23/2020 22:14      IMPRESSION AND PLAN:  Active Problems:   Cellulitis of right leg  1.  Right lower extremity cellulitis with subsequent sepsis as manifested by tachycardia with a heart rate that was up to 112 and fever that was up to 102, and tachypnea that was up to 21.  The patient meets severe sepsis criteria given initial lactic acid of 2.9. - The patient will be admitted to a medical monitored bed. - We will continue antibiotic therapy with IV Rocephin. - Warm compresses will be utilized. - We will follow blood cultures. - We will be cautious with hydration given her chest x-ray finding.  2.  Essential hypertension. - We will continue Tenormin.  3.  Lymphedema.  I do not believe that the patient is in clinical acute CHF - We will continue Lasix given get IV.  4.  Asthma without exacerbation. - We will utilize  as needed DuoNeb.  DVT prophylaxis: Lovenox. Code Status: full code. Family Communication:  The plan of care was discussed in details with the patient (and family). I answered all questions. The patient agreed to proceed with the above mentioned plan. Further management will depend upon hospital course. Disposition Plan: Back to previous home environment Consults called: none. All the records are reviewed and case discussed with ED provider.  Status is: Inpatient  Remains inpatient appropriate because:Ongoing active pain requiring inpatient pain management, Ongoing diagnostic testing needed not appropriate for outpatient work up, Unsafe d/c plan, IV treatments appropriate  due to intensity of illness or inability to take PO, and Inpatient level of care appropriate due to severity of illness  Dispo: The patient is from: Home              Anticipated d/c is to: Home              Patient currently is not medically stable to d/c.   Difficult to place patient No      TOTAL TIME TAKING CARE OF THIS PATIENT: 50 minutes.    Christel Mormon M.D on 12/23/2020 at 11:27 PM  [facility]   From 7 PM-7 AM, contact night-coverage [email or web address]  CC: Primary care physician; Ranae Plumber, [place]

## 2020-12-23 NOTE — ED Triage Notes (Signed)
Pt reports not feeling well today, reports back pain and chills. Per EMS pt's temp 102.4 given 325mg  of Tylenol. Pt reports feeling nausea denies any vomiting denies any diarrhea. Pt talks in complete sentences no respiratory distress noted

## 2020-12-23 NOTE — ED Notes (Signed)
Pt took 1000 mg of Tylenol at 4 pm, EMS administered 325mg  around 7:45pm

## 2020-12-23 NOTE — ED Notes (Signed)
1 set of blood cultures sent to lab.  

## 2020-12-23 NOTE — ED Provider Notes (Signed)
[facility] Emergency Department Provider Note  ____________________________________________  Time seen: Approximately 11:13 PM  I have reviewed the triage vital signs and the nursing notes.   HISTORY  Chief Complaint Fever and Generalized Body Aches    HPI [name] is a 65 y.o. female with a past history of lymphedema, hypertension who comes the ED complaining of right leg pain and redness for the past week.  Also 3 days of malaise fever body aches fatigue and headaches.  She rarely leaves her home due to mobility issues, but does have a home health aide who comes into her house.  She reports that over the last 2 weeks the home health aide and the aide son have both been sick.  Symptoms are constant, waxing waning, associated with loss of appetite and decreased oral intake.    Past Medical History:  Diagnosis Date   Arthritis    Asthma    Enlarged heart    Hypertension    Lymphedema    Sleep apnea    Spinal stenosis      Patient Active Problem List   Diagnosis Date Noted   DNR (do not resuscitate)    Lactic acidosis    AKI (acute kidney injury) ([place])    Bilateral lower leg cellulitis 04/15/2020   Anemia    Cellulitis of left lower extremity 11/03/2018   Sepsis ([place]) 04/17/2018   Chest pain with high risk for cardiac etiology 02/04/2017   Heart palpitations 02/04/2017   SOB (shortness of breath) on exertion 02/04/2017   OSA (obstructive sleep apnea) 08/04/2016   HTN (hypertension) 08/04/2016   Asthma 08/04/2016   Screening for colon cancer 05/17/2015   Cellulitis 01/24/2015   Cellulitis of right leg 01/23/2015   Skin rash 12/28/2013   Tobacco use 12/28/2013   Spinal stenosis 07/11/2011   Cataracts, bilateral 05/30/2010   History of colonic polyps 12/28/2009   Lymphedema 02/10/2009   Idiopathic urticaria 02/10/2009   Morbid obesity with BMI of 60.0-69.9, adult ([place]) 02/10/2009   Other abnormal glucose 02/10/2009   Essential  hypertension 02/10/2009     Past Surgical History:  Procedure Laterality Date   COLONOSCOPY WITH PROPOFOL N/A 03/12/2018   Procedure: COLONOSCOPY WITH PROPOFOL;  Surgeon: Manya Silvas, MD;  Location: [facility] ENDOSCOPY;  Service: Endoscopy;  Laterality: N/A;   NO PAST SURGERIES       Prior to Admission medications   Medication Sig Start Date End Date Taking? Authorizing Provider  acetaminophen (TYLENOL) 500 MG tablet Take 500-1,000 mg by mouth every 6 (six) hours as needed for mild pain or fever.    [provider]  atenolol (TENORMIN) 50 MG tablet Take 50 mg by mouth daily. 02/29/20   [provider]  furosemide (LASIX) 20 MG tablet Take 1 tablet (20 mg total) by mouth daily. Patient taking differently: Take 40 mg by mouth daily. 06/07/19   Loletha Grayer, MD     Allergies Vancomycin, Ace inhibitors, and Ibuprofen   Family History  Problem Relation Age of Onset   Thyroid disease Other    Breast cancer Maternal Aunt     Social History Social History   Tobacco Use   Smoking status: Former    Packs/day: 0.25    Pack years: 0.00    Types: Cigarettes    Start date: 07/09/1976    Quit date: 07/09/1990    Years since quitting: 30.4   Smokeless tobacco: Current    Types: Snuff  Vaping Use   Vaping  Use: Never used  Substance Use Topics   Alcohol use: No   Drug use: No    Review of Systems  Constitutional:   Positive fever and chills.  ENT:   No sore throat. No rhinorrhea. Cardiovascular:   No chest pain or syncope. Respiratory:   No dyspnea or cough. Gastrointestinal:   Negative for abdominal pain, vomiting and diarrhea.  Musculoskeletal:   Positive right leg pain and redness All other systems reviewed and are negative except as documented above in ROS and HPI.  ____________________________________________   PHYSICAL EXAM:  VITAL SIGNS: ED Triage Vitals  Enc Vitals Group     BP 12/23/20 2001 112/63     Pulse Rate 12/23/20 2001 86     Resp  12/23/20 2001 (!) 21     Temp 12/23/20 2001 (!) 102 F (38.9 C)     Temp Source 12/23/20 2001 Oral     SpO2 12/23/20 2001 97 %     Weight 12/23/20 2002 (!) 369 lb (167.4 kg)     Height 12/23/20 2002 5\' 3"  (1.6 m)     Head Circumference --      Peak Flow --      Pain Score 12/23/20 2002 9     Pain Loc --      Pain Edu? --      Excl. in [place]? --     Vital signs reviewed, nursing assessments reviewed.   Constitutional:   Alert and oriented. Non-toxic appearance.  Super morbid obesity Eyes:   Conjunctivae are normal. EOMI. PERRL. ENT      Head:   Normocephalic and atraumatic.      Nose:   Wearing a mask.      Mouth/Throat:   Wearing a mask.      Neck:   No meningismus. Full ROM. Hematological/Lymphatic/Immunilogical:   No cervical lymphadenopathy. Cardiovascular:   Tachycardia heart rate 110. Symmetric bilateral radial and DP pulses.  No murmurs. Cap refill less than 2 seconds. Respiratory:   Normal respiratory effort without tachypnea/retractions. Breath sounds are clear and equal bilaterally. No wheezes/rales/rhonchi. Gastrointestinal:   Soft and nontender. Non distended.   No rebound, rigidity, or guarding.  Musculoskeletal:   Normal range of motion in all extremities.   There is erythema, skin induration, and tenderness at the level of the right mid calf centered around pannus crease.  No crepitus or purulent drainage.  No fluctuance Neurologic:   Normal speech and language.  Motor grossly intact. No acute focal neurologic deficits are appreciated.  Skin:    Skin is warm, dry with inflammatory changes as above on the right lower leg.  No petechiae, purpura, or bullae.  ____________________________________________    LABS (pertinent positives/negatives) (all labs ordered are listed, but only abnormal results are displayed) Labs Reviewed  COMPREHENSIVE METABOLIC PANEL - Abnormal; Notable for the following components:      Result Value   Sodium 133 (*)    Glucose, Bld 111 (*)     Creatinine, Ser 1.11 (*)    Calcium 8.6 (*)    Total Protein 8.4 (*)    Alkaline Phosphatase 131 (*)    GFR, Estimated 55 (*)    All other components within normal limits  LACTIC ACID, PLASMA - Abnormal; Notable for the following components:   Lactic Acid, Venous 2.9 (*)    All other components within normal limits  CBC WITH DIFFERENTIAL/PLATELET - Abnormal; Notable for the following components:   WBC 19.3 (*)    Neutro Abs  17.0 (*)    Monocytes Absolute 1.2 (*)    Abs Immature Granulocytes 0.32 (*)    All other components within normal limits  CULTURE, BLOOD (ROUTINE X 2)  CULTURE, BLOOD (ROUTINE X 2)  RESP PANEL BY RT-PCR (FLU A&B, COVID) ARPGX2  PROTIME-INR  LACTIC ACID, PLASMA  URINALYSIS, COMPLETE (UACMP) WITH MICROSCOPIC  POC SARS CORONAVIRUS 2 AG -  ED   ____________________________________________   EKG    ____________________________________________    RADIOLOGY  DG Chest Portable 1 View  Result Date: 12/23/2020 CLINICAL DATA:  Body ache and fever EXAM: PORTABLE CHEST 1 VIEW COMPARISON:  09/24/2020, 04/12/2019 FINDINGS: Cardiomegaly with vascular congestion and probable mild edema. No pleural effusion. No consolidation or pneumothorax. IMPRESSION: Cardiomegaly with vascular congestion and probable mild edema Electronically Signed   By: Donavan Foil M.D.   On: 12/23/2020 22:14    ____________________________________________   PROCEDURES Procedures  ____________________________________________  DIFFERENTIAL DIAGNOSIS   Cellulitis, viral illness, dehydration, electrolyte normality, sepsis  CLINICAL IMPRESSION / ASSESSMENT AND PLAN / ED COURSE  Medications ordered in the ED: Medications  cefTRIAXone (ROCEPHIN) 2 g in sodium chloride 0.9 % 100 mL IVPB (has no administration in time range)  ketorolac (TORADOL) 30 MG/ML injection 15 mg (has no administration in time range)  sodium chloride 0.9 % bolus 1,000 mL (1,000 mLs Intravenous New Bag/Given  12/23/20 2200)    Pertinent labs & imaging results that were available during my care of the patient were reviewed by me and considered in my medical decision making (see chart for details).  [name] was evaluated in Emergency Department on 12/23/2020 for the symptoms described in the history of present illness. She was evaluated in the context of the global COVID-19 pandemic, which necessitated consideration that the patient might be at risk for infection with the SARS-CoV-2 virus that causes COVID-19. Institutional protocols and algorithms that pertain to the evaluation of patients at risk for COVID-19 are in a state of rapid change based on information released by regulatory bodies including the CDC and federal and state organizations. These policies and algorithms were followed during the patient's care in the ED.   Patient presents with right leg pain and redness, fever tachycardia tachypnea, leukocytosis of 19,000, elevated lactate all concerning for sepsis from cellulitis of the right leg.  We will start Rocephin, obtain cultures.  Plan to admit due to her chronic medical issues including severe obesity. Some symptoms and historical elements also suggestive of viral illness, will send COVID/flu PCR.      ____________________________________________   FINAL CLINICAL IMPRESSION(S) / ED DIAGNOSES    Final diagnoses:  Cellulitis of right lower extremity  Sepsis, due to unspecified organism, unspecified whether acute organ dysfunction present ([place])  Morbid obesity ([place])  Chronic congestive heart failure, unspecified heart failure type [facility])     ED Discharge Orders     None       Portions of this note were generated with dragon dictation software. Dictation errors may occur despite best attempts at proofreading.   Carrie Mew, MD 12/23/20 2318

## 2020-12-23 NOTE — ED Triage Notes (Signed)
FIRST NURSE NOTE:  Pt arrived via [name] from home with reports of not feeling well x 3 days, fever and body aches.   Pt given 325 tylenol with EMS, BP 162/94, p-102, RA 95-98%, temp 102.4

## 2020-12-24 ENCOUNTER — Inpatient Hospital Stay: Payer: Medicare HMO

## 2020-12-24 ENCOUNTER — Encounter: Payer: Self-pay | Admitting: Family Medicine

## 2020-12-24 DIAGNOSIS — L03115 Cellulitis of right lower limb: Secondary | ICD-10-CM | POA: Diagnosis not present

## 2020-12-24 LAB — CBC
HCT: 36.1 % (ref 36.0–46.0)
Hemoglobin: 11.6 g/dL — ABNORMAL LOW (ref 12.0–15.0)
MCH: 27 pg (ref 26.0–34.0)
MCHC: 32.1 g/dL (ref 30.0–36.0)
MCV: 84.1 fL (ref 80.0–100.0)
Platelets: 132 10*3/uL — ABNORMAL LOW (ref 150–400)
RBC: 4.29 MIL/uL (ref 3.87–5.11)
RDW: 14.6 % (ref 11.5–15.5)
WBC: 13 10*3/uL — ABNORMAL HIGH (ref 4.0–10.5)
nRBC: 0 % (ref 0.0–0.2)

## 2020-12-24 LAB — BASIC METABOLIC PANEL
Anion gap: 8 (ref 5–15)
BUN: 17 mg/dL (ref 8–23)
CO2: 26 mmol/L (ref 22–32)
Calcium: 8.1 mg/dL — ABNORMAL LOW (ref 8.9–10.3)
Chloride: 101 mmol/L (ref 98–111)
Creatinine, Ser: 1.11 mg/dL — ABNORMAL HIGH (ref 0.44–1.00)
GFR, Estimated: 55 mL/min — ABNORMAL LOW (ref >60)
Glucose, Bld: 102 mg/dL — ABNORMAL HIGH (ref 70–99)
Potassium: 3.8 mmol/L (ref 3.5–5.1)
Sodium: 135 mmol/L (ref 135–145)

## 2020-12-24 LAB — PROTIME-INR
INR: 1.3 — ABNORMAL HIGH (ref 0.8–1.2)
Prothrombin Time: 16.1 seconds — ABNORMAL HIGH (ref 11.4–15.2)

## 2020-12-24 LAB — RESP PANEL BY RT-PCR (FLU A&B, COVID) ARPGX2
Influenza A by PCR: NEGATIVE
Influenza B by PCR: NEGATIVE
SARS Coronavirus 2 by RT PCR: NEGATIVE

## 2020-12-24 LAB — HIV ANTIBODY (ROUTINE TESTING W REFLEX): HIV Screen 4th Generation wRfx: NONREACTIVE

## 2020-12-24 LAB — APTT: aPTT: 46 seconds — ABNORMAL HIGH (ref 24–36)

## 2020-12-24 LAB — LACTIC ACID, PLASMA: Lactic Acid, Venous: 1 mmol/L (ref 0.5–1.9)

## 2020-12-24 MED ORDER — FLUTICASONE PROPIONATE 50 MCG/ACT NA SUSP
2.0000 | Freq: Every day | NASAL | Status: DC
  Filled 2020-12-24: qty 16

## 2020-12-24 MED ORDER — IPRATROPIUM-ALBUTEROL 0.5-2.5 (3) MG/3ML IN SOLN: 3.0000 mL | RESPIRATORY_TRACT | Status: DC | PRN

## 2020-12-24 MED ORDER — IOHEXOL 300 MG/ML  SOLN
100.0000 mL | Freq: Once | INTRAMUSCULAR | Status: AC | PRN
  Administered 2020-12-24: 100 mL via INTRAVENOUS

## 2020-12-24 MED ORDER — SODIUM CHLORIDE 0.9 % IV SOLN: 2.0000 g | INTRAVENOUS | Status: DC

## 2020-12-24 NOTE — ED Notes (Signed)
Pt stating she uses CPAP at night to sleep. Pt placed on 3L [place] at this time for comfort while resting and MD made aware.

## 2020-12-24 NOTE — ED Notes (Signed)
Patient states she wishes to rescind her DNR code status. Dr. Sidney Ace notified.

## 2020-12-24 NOTE — ED Notes (Signed)
Updated sister with pt approval

## 2020-12-24 NOTE — ED Notes (Signed)
Pt given meal tray.

## 2020-12-24 NOTE — ED Notes (Signed)
Pt ate lunch, lights dimmed, no other needs at this time

## 2020-12-24 NOTE — Progress Notes (Signed)
PHARMACIST - PHYSICIAN COMMUNICATION  CONCERNING:  Enoxaparin (Lovenox) for DVT Prophylaxis    RECOMMENDATION: Patient was prescribed enoxaprin 40mg  q24 hours for VTE prophylaxis.   Filed Weights   12/23/20 2002  Weight: (!) 167.4 kg (369 lb)    Body mass index is 65.37 kg/m.  Estimated Creatinine Clearance: 78.5 mL/min (A) (by C-G formula based on SCr of 1.11 mg/dL (H)).   Based on [facility] patient is candidate for enoxaparin 0.5mg /kg TBW SQ every 24 hours based on BMI being >30.  DESCRIPTION: Pharmacy has adjusted enoxaparin dose per [facility] policy.  Patient is now receiving enoxaparin 0.5 mg/kg every 24 hours   Renda Rolls, PharmD, [facility] 12/24/2020 1:13 AM

## 2020-12-24 NOTE — ED Notes (Signed)
Pt ate 100% dinner, no other needs at this time

## 2020-12-24 NOTE — ED Notes (Signed)
Pt to ct 

## 2020-12-24 NOTE — Progress Notes (Signed)
[facility] Progress Note  Patient: [name]    ZOX:[id number]  DOA: 12/23/2020     Date of Service: the patient was seen and examined on 12/24/2020  Brief hospital course: Past medical history of asthma, osteoarthritis, HTN, lymphedema with morbid obesity, OSA.  Presents with complaints of worsening of right medial thigh swelling erythema and drainage. Currently plan is continue Antibiotics.  Assessment and Plan: 1.  Sepsis due to cellulitis of the right lower extremity.,  Present on admission Drainage. Lymphedema bilaterally. Met SIRS criteria on admission. Has chronic lymphedema. Currently redness improving. Continue with IV Rocephin. Patient reports that she actually has been having some drainage from her right knee area which is progressively worsening for last 1 week.  Check for CT leg to rule out any acute abnormality. Continue with IV fluids for now.  2.  HTN Continue to hold blood pressure medication.  The setting of sepsis.  3.  Bilateral chronic lymphedema. Hold Lasix for now. Monitor blood  4.  Asthma. Continue as needed nebulizers and inhalers.  5.  Morbid obesity. OSA on CPAP. Continue CPAP nightly. As needed oxygen. Placing the patient at high risk for poor outcome given her morbid obesity as well as lymphedema. Patient will benefit for lymphedema clinic referral although tells me that she actually has been to the clinic and they have told her that we do not have any significant therapy to offer. Body mass index is 65.37 kg/m.   Diet: Cardiac diet DVT Prophylaxis: Subcutaneous Lovenox      Advance goals of care discussion: Full code  Family Communication: no family was present at bedside, at the time of interview.   Disposition:  Status is: Inpatient  Remains inpatient appropriate because:Hemodynamically unstable  Dispo: The patient is from: Home              Anticipated d/c is to: Home              Patient currently is not  medically stable to d/c.   Difficult to place patient No        Subjective: No nausea no vomiting.  No fever no chills.  Feeling better but continues to have headache.  Also reports sinus drainage.  Reports some shortness of breath.  Physical Exam:  General: Appear in mild distress, no Rash; Oral Mucosa Clear, moist. no Abnormal Neck Mass Or lumps, Conjunctiva normal  Cardiovascular: S1 and S2 Present, no Murmur, Respiratory: good respiratory effort, Bilateral Air entry present and CTA, no Crackles, no wheezes Abdomen: Bowel Sound present, Soft and no tenderness Extremities: bilateral lymphedema with redness and warmth involving the right leg. Neurology: alert and oriented to time, place, and person affect appropriate. no new focal deficit Gait not checked due to patient safety concerns   Vitals:   12/24/20 1200 12/24/20 1430 12/24/20 1547 12/24/20 1729  BP: (!) 111/56 (!) 104/53 102/61 110/65  Pulse: 69 63 62 60  Resp: $Remo'18 15 16 20  'jTjDO$ Temp:      TempSrc:      SpO2: 100% 99% 100% 100%  Weight:      Height:        Intake/Output Summary (Last 24 hours) at 12/24/2020 1819 Last data filed at 12/24/2020 0005 Gross per 24 hour  Intake 1100 ml  Output 100 ml  Net 1000 ml   Filed Weights   12/23/20 2002  Weight: (!) 167.4 kg    Data Reviewed: I have personally reviewed and interpreted daily labs, tele strips, imaging.  I reviewed all nursing notes, pharmacy notes, vitals, pertinent old records I have discussed plan of care as described above with RN and patient/family.  CBC: Recent Labs  Lab 12/23/20 2011 12/24/20 0749  WBC 19.3* 13.0*  NEUTROABS 17.0*  --   HGB 13.0 11.6*  HCT 41.1 36.1  MCV 85.1 84.1  PLT 160 753*   Basic Metabolic Panel: Recent Labs  Lab 12/23/20 2011 12/24/20 0749  NA 133* 135  K 4.2 3.8  CL 101 101  CO2 24 26  GLUCOSE 111* 102*  BUN 14 17  CREATININE 1.11* 1.11*  CALCIUM 8.6* 8.1*    Studies: DG Chest Portable 1 View  Result  Date: 12/23/2020 CLINICAL DATA:  Body ache and fever EXAM: PORTABLE CHEST 1 VIEW COMPARISON:  09/24/2020, 04/12/2019 FINDINGS: Cardiomegaly with vascular congestion and probable mild edema. No pleural effusion. No consolidation or pneumothorax. IMPRESSION: Cardiomegaly with vascular congestion and probable mild edema Electronically Signed   By: Donavan Foil M.D.   On: 12/23/2020 22:14    Scheduled Meds:  enoxaparin (LOVENOX) injection  0.5 mg/kg Subcutaneous Q24H   Continuous Infusions:  sodium chloride 100 mL/hr at 12/24/20 1436   cefTRIAXone (ROCEPHIN)  IV Stopped (12/24/20 0005)   PRN Meds: acetaminophen **OR** acetaminophen, ipratropium-albuterol, magnesium hydroxide, ondansetron **OR** ondansetron (ZOFRAN) IV, traZODone  Time spent: 35 minutes  Author: Berle Mull, MD [facility] 12/24/2020 6:19 PM  To reach On-call, see care teams to locate the attending and reach out via [email or web address]. Between 7PM-7AM, please contact night-coverage If you still have difficulty reaching the attending provider, please page the [facility] (Director on Call) for [facility] on amion for assistance.

## 2020-12-25 DIAGNOSIS — L03115 Cellulitis of right lower limb: Secondary | ICD-10-CM | POA: Diagnosis not present

## 2020-12-25 LAB — CBC
HCT: 37.9 % (ref 36.0–46.0)
Hemoglobin: 11.7 g/dL — ABNORMAL LOW (ref 12.0–15.0)
MCH: 27 pg (ref 26.0–34.0)
MCHC: 30.9 g/dL (ref 30.0–36.0)
MCV: 87.5 fL (ref 80.0–100.0)
Platelets: 123 10*3/uL — ABNORMAL LOW (ref 150–400)
RBC: 4.33 MIL/uL (ref 3.87–5.11)
RDW: 14.8 % (ref 11.5–15.5)
WBC: 7.7 10*3/uL (ref 4.0–10.5)
nRBC: 0 % (ref 0.0–0.2)

## 2020-12-25 LAB — MAGNESIUM: Magnesium: 2 mg/dL (ref 1.7–2.4)

## 2020-12-25 LAB — BASIC METABOLIC PANEL
Anion gap: 4 — ABNORMAL LOW (ref 5–15)
BUN: 17 mg/dL (ref 8–23)
CO2: 26 mmol/L (ref 22–32)
Calcium: 8.2 mg/dL — ABNORMAL LOW (ref 8.9–10.3)
Chloride: 109 mmol/L (ref 98–111)
Creatinine, Ser: 0.96 mg/dL (ref 0.44–1.00)
GFR, Estimated: >60 mL/min (ref >60)
Glucose, Bld: 88 mg/dL (ref 70–99)
Potassium: 4.2 mmol/L (ref 3.5–5.1)
Sodium: 139 mmol/L (ref 135–145)

## 2020-12-25 MED ORDER — ORAL CARE MOUTH RINSE
15.0000 mL | Freq: Two times a day (BID) | OROMUCOSAL | Status: DC
  Administered 2020-12-25: 15 mL via OROMUCOSAL

## 2020-12-25 MED ORDER — ENOXAPARIN SODIUM 100 MG/ML IJ SOSY
0.5000 mg/kg | PREFILLED_SYRINGE | INTRAMUSCULAR | Status: DC
  Filled 2020-12-25: qty 0.9

## 2020-12-25 MED ORDER — LORATADINE 10 MG PO TABS
10.0000 mg | ORAL_TABLET | Freq: Every day | ORAL | Status: DC
  Administered 2020-12-25: 10 mg via ORAL
  Filled 2020-12-25: qty 1

## 2020-12-25 MED ORDER — CETIRIZINE HCL 10 MG PO TABS: 10.0000 mg | ORAL_TABLET | Freq: Every day | ORAL | 0 refills | Status: AC

## 2020-12-25 MED ORDER — CEFDINIR 300 MG PO CAPS: 300.0000 mg | ORAL_CAPSULE | Freq: Two times a day (BID) | ORAL | 0 refills | Status: AC

## 2020-12-25 NOTE — Progress Notes (Addendum)
Patient to discharge home today. Needs EMS transport. EMS paperwork completed.  Notified [facility] of discharge. Per RN patient will be ready for transport at 3:15 pm.   3:23[facility] called for transport. Patient is next on the list. RN notified.   Oleh Genin, [place]

## 2020-12-25 NOTE — Discharge Summary (Signed)
[facility] Discharge Summary   Patient: [name] YBW:[id number]  PCP: Ranae Plumber, [place]  Date of admission: 12/23/2020   Date of discharge:  12/25/2020     Discharge Diagnoses:  Principal diagnosis Cellulitis with lymphedema Active Problems:   Cellulitis of right leg   Admitted From: Home Disposition:  Home with home health  Recommendations for Outpatient Follow-up:  PCP: Follow-up with PCP in 1 week. Follow up LABS/TEST: None  Follow-up Information     Ranae Plumber, [place]. Schedule an appointment as soon as possible for a visit in 2 week(s).   Specialty: Family Medicine Contact information: [place] [address] [phone]                Discharge Instructions     Diet - low sodium heart healthy   Complete by: As directed    Increase activity slowly   Complete by: As directed        Diet recommendation: Cardiac diet  Activity: The patient is advised to gradually reintroduce usual activities, as tolerated  Discharge Condition: stable  Code Status: Full code   History of present illness: As per the H and P dictated on admission, "[name] is a 65 y.o. female with medical history significant for asthma, osteoarthritis, hypertension, lymphedema, morbid obesity, obstructive sleep apnea, who presented to the ER with acute onset of worsening right medial thigh swelling erythema and tenderness as well as pain extending to her popliteal area.  She admitted to nausea without vomiting.  She had positive tactile fever and chills.  She denied any chest pain or dyspnea or palpitations.  She admitted to mild abdominal soreness and back pain.  She has been having diminished urine output.  No dysuria or hematuria or flank pain.  No cough or wheezing or hemoptysis."  Hospital Course:  Summary of her active problems in the hospital is as following.   1.  Sepsis due to cellulitis of the right lower extremity.,  Present on  admission Drainage. Lymphedema bilaterally. Met SIRS criteria on admission. Has chronic lymphedema. Currently redness improving. Treated with IV Rocephin.  Continue Omnicef on discharge. Patient reports that she actually has been having some drainage from her right knee area which is progressively worsening for last 1 week.  CT leg ruled out any acute abnormality.  No abscess, no myositis, no osteomyelitis.   2.  HTN Blood pressure is improved significantly.  We will resume home regimen on discharge.   3.  Bilateral chronic lymphedema. Continue home care.    4.  Asthma. Continue as needed nebulizers and inhalers.   5.  Morbid obesity. OSA on CPAP. Continue CPAP nightly. Placing the patient at high risk for poor outcome given her morbid obesity as well as lymphedema. Patient will benefit for lymphedema clinic referral although tells me that she actually has been to the clinic and they have told her that we do not have any significant therapy to offer. Body mass index is 70.15 kg/m.   on the day of the discharge the patient's vitals were stable, and no other new acute medical condition were reported. The patient was felt safe to be discharge at Home with Home health.  Consultants: none Procedures: none  DISCHARGE MEDICATION: Allergies as of 12/25/2020       Reactions   Vancomycin Itching, Nausea And Vomiting   Ace Inhibitors Hives, Swelling   Ibuprofen Hives, Swelling        Medication List  TAKE these medications    acetaminophen 500 MG tablet Commonly known as: TYLENOL Take 500-1,000 mg by mouth every 6 (six) hours as needed for mild pain or fever.   atenolol 50 MG tablet Commonly known as: TENORMIN Take 50 mg by mouth daily.   cefdinir 300 MG capsule Commonly known as: OMNICEF Take 1 capsule (300 mg total) by mouth 2 (two) times daily for 5 days.   cetirizine 10 MG tablet Commonly known as: ZYRTEC Take 1 tablet (10 mg total) by mouth daily.    furosemide 20 MG tablet Commonly known as: LASIX Take 1 tablet (20 mg total) by mouth daily. What changed: how much to take        Discharge Exam: Filed Weights   12/23/20 2002 12/24/20 2128  Weight: (!) 167.4 kg (!) 179.6 kg   Vitals:   12/25/20 1154 12/25/20 1601  BP: (!) 145/78 129/70  Pulse: 83 76  Resp: 18 14  Temp: 99 F (37.2 C) 98.7 F (37.1 C)  SpO2: 97% 93%   General: Appear in no distress, improving redness, no other Rash; Oral Mucosa Clear, moist. no Abnormal Neck Mass Or lumps, Conjunctiva normal  Cardiovascular: S1 and S2 Present, no Murmur Respiratory: good respiratory effort, Bilateral Air entry present and noCTA, no Crackles, no wheezes Abdomen: Bowel Sound present, Soft and no tenderness Extremities: bilateral lymphedema Pedal edema Neurology: alert and oriented to time, place, and person affect appropriate. no new focal deficit On 12/25/2020  On admission    The results of significant diagnostics from this hospitalization (including imaging, microbiology, ancillary and laboratory) are listed below for reference.    Significant Diagnostic Studies: DG Chest Portable 1 View  Result Date: 12/23/2020 CLINICAL DATA:  Body ache and fever EXAM: PORTABLE CHEST 1 VIEW COMPARISON:  09/24/2020, 04/12/2019 FINDINGS: Cardiomegaly with vascular congestion and probable mild edema. No pleural effusion. No consolidation or pneumothorax. IMPRESSION: Cardiomegaly with vascular congestion and probable mild edema Electronically Signed   By: Donavan Foil M.D.   On: 12/23/2020 22:14   CT EXTREMITY LOWER RIGHT W CONTRAST  Result Date: 12/24/2020 CLINICAL DATA:  Right lower extremity cellulitis, lymphedema EXAM: CT OF THE LOWER RIGHT EXTREMITY WITH CONTRAST TECHNIQUE: Multidetector CT imaging of the lower right extremity was performed according to the standard protocol following intravenous contrast administration. CONTRAST:  170mL OMNIPAQUE IOHEXOL 300 MG/ML  SOLN  COMPARISON:  CT 01/23/2015.  X-ray 06/01/2019 FINDINGS: Bones/Joint/Cartilage No acute fracture or dislocation. Severe tricompartmental osteoarthritis of the right knee, most pronounced within the medial compartment. Trace right knee joint effusion, nonspecific. Mild arthropathy of the ankle and foot. Small subchondral cystic changes of the talar dome. No bony destruction or periosteal elevation is identified involving the right lower extremity. No suspicious lytic or sclerotic bone lesion. Ligaments Suboptimally assessed by CT. Muscles and Tendons Diffuse mild fatty infiltration of the lower leg musculature. No acute tendon abnormality. No appreciable tenosynovial fluid collection. Soft tissues Marked circumferential subcutaneous edema with prominent skin thickening throughout the right lower extremity compatible with history of chronic lymphedema. No organized or drainable fluid collection. No soft tissue gas. No deep soft tissue ulceration. Enlarged bilateral inguinal lymph nodes measuring up to 2.2 cm on the right and 1.7 cm on the left. Prominent bilateral external iliac lymph nodes, largest is on the right and measures 17 mm in diameter. No acute findings are seen within the visualized pelvis. IMPRESSION: 1. No CT evidence of acute osteomyelitis of the right lower extremity. 2. Marked circumferential subcutaneous edema  with prominent skin thickening throughout the right lower extremity compatible with history of chronic lymphedema. Superimposed cellulitis not excluded. No organized or drainable fluid collection. No soft tissue gas. 3. Enlarged bilateral inguinal and external iliac lymph nodes are nonspecific but most likely secondary to lymphedema. 4. Severe tricompartmental osteoarthritis of the right knee, most pronounced within the medial compartment. Trace right knee joint effusion, nonspecific. Electronically Signed   By: Davina Poke D.O.   On: 12/24/2020 19:48    Microbiology: Recent Results  (from the past 240 hour(s))  Culture, blood (Routine x 2)     Status: None (Preliminary result)   Collection Time: 12/23/20  8:12 PM   Specimen: BLOOD  Result Value Ref Range Status   Specimen Description BLOOD LEFT HAND  Final   Special Requests   Final    BOTTLES DRAWN AEROBIC AND ANAEROBIC Blood Culture results may not be optimal due to an inadequate volume of blood received in culture bottles   Culture   Final    NO GROWTH 2 DAYS Performed at [facility], [address] [place], [place] [address]    Report Status PENDING  Incomplete  Culture, blood (Routine x 2)     Status: None (Preliminary result)   Collection Time: 12/23/20  9:50 PM   Specimen: BLOOD  Result Value Ref Range Status   Specimen Description BLOOD BLOOD RIGHT WRIST  Final   Special Requests   Final    BOTTLES DRAWN AEROBIC AND ANAEROBIC Blood Culture results may not be optimal due to an inadequate volume of blood received in culture bottles   Culture   Final    NO GROWTH 2 DAYS Performed at [facility], [address], [place], [place] [address]    Report Status PENDING  Incomplete  Resp Panel by RT-PCR (Flu A&B, Covid) Nasopharyngeal Swab     Status: None   Collection Time: 12/23/20 11:10 PM   Specimen: Nasopharyngeal Swab; Nasopharyngeal(NP) swabs in vial transport medium  Result Value Ref Range Status   SARS Coronavirus 2 by RT PCR NEGATIVE NEGATIVE Final    Comment: (NOTE) SARS-CoV-2 target nucleic acids are NOT DETECTED.  The SARS-CoV-2 RNA is generally detectable in upper respiratory specimens during the acute phase of infection. The lowest concentration of SARS-CoV-2 viral copies this assay can detect is 138 copies/mL. A negative result does not preclude SARS-Cov-2 infection and should not be used as the sole basis for treatment or other patient management decisions. A negative result may occur with  improper specimen collection/handling, submission of specimen other than  nasopharyngeal swab, presence of viral mutation(s) within the areas targeted by this assay, and inadequate number of viral copies(<138 copies/mL). A negative result must be combined with clinical observations, patient history, and epidemiological information. The expected result is Negative.  Fact Sheet for Patients:  EntrepreneurPulse.com.au  Fact Sheet for Healthcare Providers:  IncredibleEmployment.be  This test is no t yet approved or cleared by the [place] FDA and  has been authorized for detection and/or diagnosis of SARS-CoV-2 by FDA under an Emergency Use Authorization (EUA). This EUA will remain  in effect (meaning this test can be used) for the duration of the COVID-19 declaration under Section 564(b)(1) of the Act, 21 U.S.C.section 360bbb-3(b)(1), unless the authorization is terminated  or revoked sooner.       Influenza A by PCR NEGATIVE NEGATIVE Final   Influenza B by PCR NEGATIVE NEGATIVE Final    Comment: (NOTE) The Xpert Xpress SARS-CoV-2/FLU/RSV plus assay  is intended as an aid in the diagnosis of influenza from Nasopharyngeal swab specimens and should not be used as a sole basis for treatment. Nasal washings and aspirates are unacceptable for Xpert Xpress SARS-CoV-2/FLU/RSV testing.  Fact Sheet for Patients: EntrepreneurPulse.com.au  Fact Sheet for Healthcare Providers: IncredibleEmployment.be  This test is not yet approved or cleared by the [place] FDA and has been authorized for detection and/or diagnosis of SARS-CoV-2 by FDA under an Emergency Use Authorization (EUA). This EUA will remain in effect (meaning this test can be used) for the duration of the COVID-19 declaration under Section 564(b)(1) of the Act, 21 U.S.C. section 360bbb-3(b)(1), unless the authorization is terminated or revoked.  Performed at [facility], [address] [place], [place]  [address]      Labs: CBC: Recent Labs  Lab 12/23/20 2011 12/24/20 0749 12/25/20 0515  WBC 19.3* 13.0* 7.7  NEUTROABS 17.0*  --   --   HGB 13.0 11.6* 11.7*  HCT 41.1 36.1 37.9  MCV 85.1 84.1 87.5  PLT 160 132* 983*   Basic Metabolic Panel: Recent Labs  Lab 12/23/20 2011 12/24/20 0749 12/25/20 0515  NA 133* 135 139  K 4.2 3.8 4.2  CL 101 101 109  CO2 $Re'24 26 26  'qty$ GLUCOSE 111* 102* 88  BUN $Re'14 17 17  'uFW$ CREATININE 1.11* 1.11* 0.96  CALCIUM 8.6* 8.1* 8.2*  MG  --   --  2.0   Liver Function Tests: Recent Labs  Lab 12/23/20 2011  AST 25  ALT 15  ALKPHOS 131*  BILITOT 0.8  PROT 8.4*  ALBUMIN 3.6   CBG: No results for input(s): GLUCAP in the last 168 hours.  Time spent: 35 minutes  Signed:  Berle Mull  [facility] 12/25/2020 4:36 PM

## 2020-12-25 NOTE — Progress Notes (Signed)
Patient came up to unit around 2125, very pleasant and helpful. Oriented to room and call bell. Gave Tylenol to help with headache, believes she has a sinus infection, complains of aching left ear. Will pass along to day nurse so doctor can evaluate in the am. RT set up CPAP and patient rested well throughout night.

## 2020-12-25 NOTE — Plan of Care (Signed)
Continuing with plan of care. 

## 2020-12-25 NOTE — Plan of Care (Signed)
Discharge teaching completed with patient who is in stable condition. 

## 2020-12-25 NOTE — Plan of Care (Signed)
  Problem: Education: Goal: Knowledge of General Education information will improve Description: Including pain rating scale, medication(s)/side effects and non-pharmacologic comfort measures Outcome: Progressing   Problem: Health Behavior/Discharge Planning: Goal: Ability to manage health-related needs will improve Outcome: Progressing   Problem: Clinical Measurements: Goal: Ability to maintain clinical measurements within normal limits will improve Outcome: Progressing Goal: Will remain free from infection Outcome: Progressing Goal: Respiratory complications will improve Outcome: Progressing   Problem: Clinical Measurements: Goal: Will remain free from infection Outcome: Progressing   Problem: Clinical Measurements: Goal: Respiratory complications will improve Outcome: Progressing   Problem: Activity: Goal: Risk for activity intolerance will decrease Outcome: Progressing   Problem: Coping: Goal: Level of anxiety will decrease Outcome: Progressing   Problem: Pain Managment: Goal: General experience of comfort will improve Outcome: Progressing   Problem: Safety: Goal: Ability to remain free from injury will improve Outcome: Progressing   Problem: Skin Integrity: Goal: Risk for impaired skin integrity will decrease Outcome: Progressing

## 2020-12-28 LAB — CULTURE, BLOOD (ROUTINE X 2)
Culture: NO GROWTH
Culture: NO GROWTH

## 2021-01-19 ENCOUNTER — Inpatient Hospital Stay
Admission: EM | Admit: 2021-01-19 | Discharge: 2021-01-21 | DRG: 872 | Disposition: A | Discharge status: Home Health | Payer: Medicare HMO | Attending: Internal Medicine | Admitting: Internal Medicine

## 2021-01-19 ENCOUNTER — Emergency Department: Payer: Medicare HMO

## 2021-01-19 ENCOUNTER — Other Ambulatory Visit: Payer: Self-pay

## 2021-01-19 ENCOUNTER — Encounter: Payer: Self-pay | Admitting: Emergency Medicine

## 2021-01-19 DIAGNOSIS — I509 Heart failure, unspecified: Secondary | ICD-10-CM | POA: Diagnosis present

## 2021-01-19 DIAGNOSIS — I110 Hypertensive heart disease with heart failure: Secondary | ICD-10-CM | POA: Diagnosis present

## 2021-01-19 DIAGNOSIS — Z803 Family history of malignant neoplasm of breast: Secondary | ICD-10-CM

## 2021-01-19 DIAGNOSIS — Z886 Allergy status to analgesic agent status: Secondary | ICD-10-CM

## 2021-01-19 DIAGNOSIS — G4733 Obstructive sleep apnea (adult) (pediatric): Secondary | ICD-10-CM | POA: Diagnosis present

## 2021-01-19 DIAGNOSIS — I890 Lymphedema, not elsewhere classified: Secondary | ICD-10-CM | POA: Diagnosis present

## 2021-01-19 DIAGNOSIS — A419 Sepsis, unspecified organism: Secondary | ICD-10-CM | POA: Diagnosis not present

## 2021-01-19 DIAGNOSIS — Z79899 Other long term (current) drug therapy: Secondary | ICD-10-CM

## 2021-01-19 DIAGNOSIS — M479 Spondylosis, unspecified: Secondary | ICD-10-CM | POA: Diagnosis present

## 2021-01-19 DIAGNOSIS — Z20822 Contact with and (suspected) exposure to covid-19: Secondary | ICD-10-CM | POA: Diagnosis present

## 2021-01-19 DIAGNOSIS — Z881 Allergy status to other antibiotic agents status: Secondary | ICD-10-CM

## 2021-01-19 DIAGNOSIS — L0390 Cellulitis, unspecified: Secondary | ICD-10-CM | POA: Diagnosis present

## 2021-01-19 DIAGNOSIS — J45909 Unspecified asthma, uncomplicated: Secondary | ICD-10-CM | POA: Diagnosis present

## 2021-01-19 DIAGNOSIS — L03115 Cellulitis of right lower limb: Secondary | ICD-10-CM | POA: Diagnosis present

## 2021-01-19 DIAGNOSIS — L03116 Cellulitis of left lower limb: Secondary | ICD-10-CM | POA: Diagnosis present

## 2021-01-19 DIAGNOSIS — Z6841 Body Mass Index (BMI) 40.0 and over, adult: Secondary | ICD-10-CM

## 2021-01-19 DIAGNOSIS — D696 Thrombocytopenia, unspecified: Secondary | ICD-10-CM | POA: Diagnosis present

## 2021-01-19 DIAGNOSIS — Z888 Allergy status to other drugs, medicaments and biological substances status: Secondary | ICD-10-CM

## 2021-01-19 DIAGNOSIS — Z87891 Personal history of nicotine dependence: Secondary | ICD-10-CM

## 2021-01-19 DIAGNOSIS — L03119 Cellulitis of unspecified part of limb: Secondary | ICD-10-CM

## 2021-01-19 MED ORDER — LACTATED RINGERS IV SOLN: INTRAVENOUS | Status: DC

## 2021-01-19 MED ORDER — ACETAMINOPHEN 500 MG PO TABS
1000.0000 mg | ORAL_TABLET | Freq: Once | ORAL | Status: AC
  Administered 2021-01-19: 1000 mg via ORAL
  Filled 2021-01-19: qty 2

## 2021-01-19 MED ORDER — SODIUM CHLORIDE 0.9 % IV SOLN
2.0000 g | Freq: Once | INTRAVENOUS | Status: AC
  Administered 2021-01-19: 2 g via INTRAVENOUS
  Filled 2021-01-19: qty 2

## 2021-01-19 MED ORDER — METRONIDAZOLE 500 MG/100ML IV SOLN
500.0000 mg | Freq: Once | INTRAVENOUS | Status: AC
  Administered 2021-01-19: 500 mg via INTRAVENOUS
  Filled 2021-01-19: qty 100

## 2021-01-19 MED ORDER — VANCOMYCIN HCL IN DEXTROSE 1-5 GM/200ML-% IV SOLN
1000.0000 mg | Freq: Once | INTRAVENOUS | Status: DC
  Filled 2021-01-19: qty 200

## 2021-01-19 MED ORDER — LACTATED RINGERS IV BOLUS (SEPSIS)
600.0000 mL | Freq: Once | INTRAVENOUS | Status: AC
  Administered 2021-01-19: 600 mL via INTRAVENOUS

## 2021-01-19 MED ORDER — VANCOMYCIN HCL 2000 MG/400ML IV SOLN
2000.0000 mg | Freq: Once | INTRAVENOUS | Status: AC
  Administered 2021-01-20: 2000 mg via INTRAVENOUS
  Filled 2021-01-19: qty 400

## 2021-01-19 MED ORDER — LACTATED RINGERS IV BOLUS (SEPSIS)
1000.0000 mL | Freq: Once | INTRAVENOUS | Status: AC
  Administered 2021-01-19: 1000 mL via INTRAVENOUS

## 2021-01-19 NOTE — ED Triage Notes (Addendum)
Patient in via [name] from home with multiple complaints of back pain and headache, chills, fever; ongoing x one day.  Patient nauseous and febrile on arrival.  A/Ox4.  NAD noted at this time.

## 2021-01-19 NOTE — Progress Notes (Signed)
CODE SEPSIS - PHARMACY COMMUNICATION  **Broad Spectrum Antibiotics should be administered within 1 hour of Sepsis diagnosis**  Time Code Sepsis Called/Page Received: 2335  Antibiotics Ordered: Cefepime, Flagyl, Vancomycin  Time of 1st antibiotic administration: [place], PharmD, [facility] 01/19/2021 11:48 PM

## 2021-01-19 NOTE — Progress Notes (Addendum)
PHARMACY -  BRIEF ANTIBIOTIC NOTE   Pharmacy has received consult(s) for Cefepime and Vancomcyin from an ED provider.  The patient's profile has been reviewed for ht/wt/allergies/indication/available labs.    One time order(s) placed for Cefepime 2 gm and Vanc 2 gm per pt wt of 177.1 kg.  Pt with documented allergy to Vancomycin causing N/V & itching.  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  Further antibiotics/pharmacy consults should be ordered by admitting physician if indicated.                       Renda Rolls, PharmD, [facility] 01/19/2021 11:50 PM

## 2021-01-19 NOTE — Progress Notes (Signed)
Code Sepsis tracking by eLINK 

## 2021-01-20 DIAGNOSIS — L03119 Cellulitis of unspecified part of limb: Secondary | ICD-10-CM | POA: Diagnosis not present

## 2021-01-20 DIAGNOSIS — A419 Sepsis, unspecified organism: Secondary | ICD-10-CM | POA: Diagnosis present

## 2021-01-20 DIAGNOSIS — Z803 Family history of malignant neoplasm of breast: Secondary | ICD-10-CM | POA: Diagnosis not present

## 2021-01-20 DIAGNOSIS — Z881 Allergy status to other antibiotic agents status: Secondary | ICD-10-CM | POA: Diagnosis not present

## 2021-01-20 DIAGNOSIS — Z886 Allergy status to analgesic agent status: Secondary | ICD-10-CM | POA: Diagnosis not present

## 2021-01-20 DIAGNOSIS — L0390 Cellulitis, unspecified: Secondary | ICD-10-CM | POA: Diagnosis not present

## 2021-01-20 DIAGNOSIS — Z87891 Personal history of nicotine dependence: Secondary | ICD-10-CM | POA: Diagnosis not present

## 2021-01-20 DIAGNOSIS — L03115 Cellulitis of right lower limb: Secondary | ICD-10-CM | POA: Diagnosis present

## 2021-01-20 DIAGNOSIS — I890 Lymphedema, not elsewhere classified: Secondary | ICD-10-CM

## 2021-01-20 DIAGNOSIS — I509 Heart failure, unspecified: Secondary | ICD-10-CM | POA: Diagnosis present

## 2021-01-20 DIAGNOSIS — J45909 Unspecified asthma, uncomplicated: Secondary | ICD-10-CM | POA: Diagnosis present

## 2021-01-20 DIAGNOSIS — G4733 Obstructive sleep apnea (adult) (pediatric): Secondary | ICD-10-CM | POA: Diagnosis present

## 2021-01-20 DIAGNOSIS — L03116 Cellulitis of left lower limb: Secondary | ICD-10-CM | POA: Diagnosis present

## 2021-01-20 DIAGNOSIS — D696 Thrombocytopenia, unspecified: Secondary | ICD-10-CM | POA: Diagnosis present

## 2021-01-20 DIAGNOSIS — Z79899 Other long term (current) drug therapy: Secondary | ICD-10-CM | POA: Diagnosis not present

## 2021-01-20 DIAGNOSIS — Z888 Allergy status to other drugs, medicaments and biological substances status: Secondary | ICD-10-CM | POA: Diagnosis not present

## 2021-01-20 DIAGNOSIS — Z6841 Body Mass Index (BMI) 40.0 and over, adult: Secondary | ICD-10-CM | POA: Diagnosis not present

## 2021-01-20 DIAGNOSIS — M479 Spondylosis, unspecified: Secondary | ICD-10-CM | POA: Diagnosis present

## 2021-01-20 DIAGNOSIS — Z20822 Contact with and (suspected) exposure to covid-19: Secondary | ICD-10-CM | POA: Diagnosis present

## 2021-01-20 DIAGNOSIS — I110 Hypertensive heart disease with heart failure: Secondary | ICD-10-CM | POA: Diagnosis present

## 2021-01-20 DIAGNOSIS — I10 Essential (primary) hypertension: Secondary | ICD-10-CM

## 2021-01-20 LAB — CBC WITH DIFFERENTIAL/PLATELET
Abs Immature Granulocytes: 0.16 10*3/uL — ABNORMAL HIGH (ref 0.00–0.07)
Basophils Absolute: 0 10*3/uL (ref 0.0–0.1)
Basophils Relative: 0 %
Eosinophils Absolute: 0 10*3/uL (ref 0.0–0.5)
Eosinophils Relative: 0 %
HCT: 40.7 % (ref 36.0–46.0)
Hemoglobin: 12.6 g/dL (ref 12.0–15.0)
Immature Granulocytes: 1 %
Lymphocytes Relative: 6 %
Lymphs Abs: 0.8 10*3/uL (ref 0.7–4.0)
MCH: 26.5 pg (ref 26.0–34.0)
MCHC: 31 g/dL (ref 30.0–36.0)
MCV: 85.5 fL (ref 80.0–100.0)
Monocytes Absolute: 0.9 10*3/uL (ref 0.1–1.0)
Monocytes Relative: 6 %
Neutro Abs: 12.7 10*3/uL — ABNORMAL HIGH (ref 1.7–7.7)
Neutrophils Relative %: 87 %
Platelets: 147 10*3/uL — ABNORMAL LOW (ref 150–400)
RBC: 4.76 MIL/uL (ref 3.87–5.11)
RDW: 14.1 % (ref 11.5–15.5)
WBC: 14.6 10*3/uL — ABNORMAL HIGH (ref 4.0–10.5)
nRBC: 0 % (ref 0.0–0.2)

## 2021-01-20 LAB — PROTIME-INR
INR: 1.1 (ref 0.8–1.2)
INR: 1.2 (ref 0.8–1.2)
Prothrombin Time: 14.5 seconds (ref 11.4–15.2)
Prothrombin Time: 15.4 seconds — ABNORMAL HIGH (ref 11.4–15.2)

## 2021-01-20 LAB — CBC
HCT: 38.9 % (ref 36.0–46.0)
Hemoglobin: 12.3 g/dL (ref 12.0–15.0)
MCH: 26.5 pg (ref 26.0–34.0)
MCHC: 31.6 g/dL (ref 30.0–36.0)
MCV: 83.8 fL (ref 80.0–100.0)
Platelets: 143 10*3/uL — ABNORMAL LOW (ref 150–400)
RBC: 4.64 MIL/uL (ref 3.87–5.11)
RDW: 14.3 % (ref 11.5–15.5)
WBC: 14.8 10*3/uL — ABNORMAL HIGH (ref 4.0–10.5)
nRBC: 0 % (ref 0.0–0.2)

## 2021-01-20 LAB — BASIC METABOLIC PANEL
Anion gap: 9 (ref 5–15)
BUN: 21 mg/dL (ref 8–23)
CO2: 22 mmol/L (ref 22–32)
Calcium: 8.4 mg/dL — ABNORMAL LOW (ref 8.9–10.3)
Chloride: 104 mmol/L (ref 98–111)
Creatinine, Ser: 1.14 mg/dL — ABNORMAL HIGH (ref 0.44–1.00)
GFR, Estimated: 53 mL/min — ABNORMAL LOW (ref >60)
Glucose, Bld: 93 mg/dL (ref 70–99)
Potassium: 4 mmol/L (ref 3.5–5.1)
Sodium: 135 mmol/L (ref 135–145)

## 2021-01-20 LAB — URINALYSIS, COMPLETE (UACMP) WITH MICROSCOPIC
Bacteria, UA: NONE SEEN
Bilirubin Urine: NEGATIVE
Glucose, UA: NEGATIVE mg/dL
Hgb urine dipstick: NEGATIVE
Ketones, ur: NEGATIVE mg/dL
Leukocytes,Ua: NEGATIVE
Nitrite: NEGATIVE
Protein, ur: NEGATIVE mg/dL
Specific Gravity, Urine: 1.013 (ref 1.005–1.030)
pH: 7 (ref 5.0–8.0)

## 2021-01-20 LAB — CORTISOL-AM, BLOOD: Cortisol - AM: 9.9 ug/dL (ref 6.7–22.6)

## 2021-01-20 LAB — APTT: aPTT: 36 seconds (ref 24–36)

## 2021-01-20 LAB — COMPREHENSIVE METABOLIC PANEL
ALT: 14 U/L (ref 0–44)
AST: 25 U/L (ref 15–41)
Albumin: 3.5 g/dL (ref 3.5–5.0)
Alkaline Phosphatase: 144 U/L — ABNORMAL HIGH (ref 38–126)
Anion gap: 6 (ref 5–15)
BUN: 25 mg/dL — ABNORMAL HIGH (ref 8–23)
CO2: 29 mmol/L (ref 22–32)
Calcium: 8.8 mg/dL — ABNORMAL LOW (ref 8.9–10.3)
Chloride: 101 mmol/L (ref 98–111)
Creatinine, Ser: 1.31 mg/dL — ABNORMAL HIGH (ref 0.44–1.00)
GFR, Estimated: 45 mL/min — ABNORMAL LOW (ref >60)
Glucose, Bld: 103 mg/dL — ABNORMAL HIGH (ref 70–99)
Potassium: 4.8 mmol/L (ref 3.5–5.1)
Sodium: 136 mmol/L (ref 135–145)
Total Bilirubin: 1 mg/dL (ref 0.3–1.2)
Total Protein: 8.4 g/dL — ABNORMAL HIGH (ref 6.5–8.1)

## 2021-01-20 LAB — PROCALCITONIN
Procalcitonin: 2.27 ng/mL
Procalcitonin: 5.02 ng/mL

## 2021-01-20 LAB — LACTIC ACID, PLASMA
Lactic Acid, Venous: 1 mmol/L (ref 0.5–1.9)
Lactic Acid, Venous: 2.1 mmol/L (ref 0.5–1.9)

## 2021-01-20 LAB — RESP PANEL BY RT-PCR (FLU A&B, COVID) ARPGX2
Influenza A by PCR: NEGATIVE
Influenza B by PCR: NEGATIVE
SARS Coronavirus 2 by RT PCR: NEGATIVE

## 2021-01-20 MED ORDER — LORATADINE 10 MG PO TABS
10.0000 mg | ORAL_TABLET | Freq: Every day | ORAL | Status: DC
  Administered 2021-01-20 – 2021-01-21 (×2): 10 mg via ORAL
  Filled 2021-01-20: qty 1

## 2021-01-20 MED ORDER — MORPHINE SULFATE (PF) 2 MG/ML IV SOLN: 2.0000 mg | INTRAVENOUS | Status: DC | PRN

## 2021-01-20 MED ORDER — ONDANSETRON HCL 4 MG PO TABS: 4.0000 mg | ORAL_TABLET | Freq: Four times a day (QID) | ORAL | Status: DC | PRN

## 2021-01-20 MED ORDER — MAGNESIUM HYDROXIDE 400 MG/5ML PO SUSP: 30.0000 mL | Freq: Every day | ORAL | Status: DC | PRN

## 2021-01-20 MED ORDER — VANCOMYCIN HCL IN DEXTROSE 1-5 GM/200ML-% IV SOLN: 1000.0000 mg | Freq: Once | INTRAVENOUS | Status: DC

## 2021-01-20 MED ORDER — DIPHENHYDRAMINE HCL 50 MG/ML IJ SOLN
25.0000 mg | Freq: Once | INTRAMUSCULAR | Status: AC
  Administered 2021-01-20: 25 mg via INTRAVENOUS
  Filled 2021-01-20: qty 1

## 2021-01-20 MED ORDER — VANCOMYCIN HCL 1750 MG/350ML IV SOLN
1750.0000 mg | INTRAVENOUS | Status: DC
  Administered 2021-01-21: 1750 mg via INTRAVENOUS
  Filled 2021-01-20 (×2): qty 350

## 2021-01-20 MED ORDER — ENOXAPARIN SODIUM 100 MG/ML IJ SOSY
0.5000 mg/kg | PREFILLED_SYRINGE | INTRAMUSCULAR | Status: DC
  Administered 2021-01-20 – 2021-01-21 (×2): 87.5 mg via SUBCUTANEOUS
  Filled 2021-01-20 (×3): qty 0.88

## 2021-01-20 MED ORDER — METHOCARBAMOL 500 MG PO TABS
500.0000 mg | ORAL_TABLET | Freq: Three times a day (TID) | ORAL | Status: DC | PRN
  Administered 2021-01-20 – 2021-01-21 (×2): 500 mg via ORAL
  Filled 2021-01-20 (×2): qty 1

## 2021-01-20 MED ORDER — ATENOLOL 25 MG PO TABS
50.0000 mg | ORAL_TABLET | Freq: Every day | ORAL | Status: DC
  Administered 2021-01-21: 50 mg via ORAL
  Filled 2021-01-20: qty 2

## 2021-01-20 MED ORDER — METRONIDAZOLE 500 MG/100ML IV SOLN
500.0000 mg | Freq: Three times a day (TID) | INTRAVENOUS | Status: DC
  Administered 2021-01-20 – 2021-01-21 (×3): 500 mg via INTRAVENOUS
  Filled 2021-01-20 (×6): qty 100

## 2021-01-20 MED ORDER — ACETAMINOPHEN 325 MG PO TABS
650.0000 mg | ORAL_TABLET | Freq: Four times a day (QID) | ORAL | Status: DC | PRN
  Administered 2021-01-20 (×2): 650 mg via ORAL
  Filled 2021-01-20 (×2): qty 2

## 2021-01-20 MED ORDER — TRAZODONE HCL 50 MG PO TABS
25.0000 mg | ORAL_TABLET | Freq: Every evening | ORAL | Status: DC | PRN
Start: 2021-01-20 — End: 2021-01-22

## 2021-01-20 MED ORDER — SODIUM CHLORIDE 0.9 % IV SOLN
2.0000 g | Freq: Three times a day (TID) | INTRAVENOUS | Status: DC
  Administered 2021-01-20 – 2021-01-21 (×3): 2 g via INTRAVENOUS
  Filled 2021-01-20 (×6): qty 2

## 2021-01-20 MED ORDER — SODIUM CHLORIDE 0.9 % IV SOLN: INTRAVENOUS | Status: DC

## 2021-01-20 MED ORDER — FUROSEMIDE 20 MG PO TABS: 20.0000 mg | ORAL_TABLET | Freq: Every day | ORAL | Status: DC

## 2021-01-20 MED ORDER — VANCOMYCIN HCL 1500 MG/300ML IV SOLN
1500.0000 mg | INTRAVENOUS | Status: DC
  Filled 2021-01-20: qty 300

## 2021-01-20 MED ORDER — ONDANSETRON HCL 4 MG/2ML IJ SOLN: 4.0000 mg | Freq: Four times a day (QID) | INTRAMUSCULAR | Status: DC | PRN

## 2021-01-20 MED ORDER — ACETAMINOPHEN 650 MG RE SUPP: 650.0000 mg | Freq: Four times a day (QID) | RECTAL | Status: DC | PRN

## 2021-01-20 NOTE — Progress Notes (Signed)
Pharmacy Antibiotic Note  [name] is a 65 y.o. female admitted on 01/19/2021 with cellulitis.  Pharmacy has been consulted for Cefepime and Vancomycin dosing.  Plan: Ordered Cefepime 2 gm q8h per indication and renal fxn.  Vanc: Pt given Vanc 2 gram in ED.    Vancomycin 1500 mg IV Q 24 hrs.  Goal AUC 400-550. Expected AUC: 501.2 SCr used: 1.31,Vd: 0.5, BMI 69.2  Pt with documented allergy to Vancomycin causing N/V & itching.  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  Pharmacy will continue to follow and will adjust abx dosing whenever warranted.   Height: 5\' 3"  (160 cm) Weight: (!) 177.1 kg (390 lb 6.4 oz) IBW/kg (Calculated) : 52.4  Temp (24hrs), Avg:102.1 F (38.9 C), Min:100.3 F (37.9 C), Max:103.4 F (39.7 C)  Recent Labs  Lab 01/19/21 2342 01/20/21 0115  WBC 14.6*  --   CREATININE 1.31*  --   LATICACIDVEN 2.1* 1.0    Estimated Creatinine Clearance: 69.1 mL/min (A) (by C-G formula based on SCr of 1.31 mg/dL (H)).    Allergies  Allergen Reactions   Ace Inhibitors Hives and Swelling   Ibuprofen Hives and Swelling   Vancomycin Itching and Nausea And Vomiting    Antimicrobials this admission: 07/14 Cefepime >> 07/14 Flagyl >>  07/15 Vancomycin >>   Microbiology results: 07/14 BCx: Pending 07/14 UCx: Pending   Thank you for allowing pharmacy to be a part of this patient's care.  Renda Rolls, PharmD, [facility] 01/20/2021 5:27 AM

## 2021-01-20 NOTE — ED Provider Notes (Signed)
[facility] Emergency Department Provider Note  ____________________________________________  Time seen: Approximately 1:43 AM  I have reviewed the triage vital signs and the nursing notes.   HISTORY  Chief Complaint Multiple Complaints   HPI [name] is a 65 y.o. female with a history of lymphedema, asthma, hypertension, sleep apnea, hypertension who presents for evaluation of fever.  Patient reports that she woke up this morning feeling very bad.  She is having chills, body aches, fever, and a very bad headache.  Was admitted a month ago for cellulitis.  She reports finishing her antibiotics but does notice some redness and warmth on bilateral legs which she says is usually present when she has an infection.  She denies sore throat, cough, chest pain, shortness of breath, abdominal pain.  She has had some mild dysuria for the last few days.  Past Medical History:  Diagnosis Date   Arthritis    Asthma    Enlarged heart    Hypertension    Lymphedema    Sleep apnea    Spinal stenosis     Patient Active Problem List   Diagnosis Date Noted   DNR (do not resuscitate)    Lactic acidosis    AKI (acute kidney injury) ([place])    Bilateral lower leg cellulitis 04/15/2020   Anemia    Cellulitis of left lower extremity 11/03/2018   Sepsis ([place]) 04/17/2018   Chest pain with high risk for cardiac etiology 02/04/2017   Heart palpitations 02/04/2017   SOB (shortness of breath) on exertion 02/04/2017   OSA (obstructive sleep apnea) 08/04/2016   HTN (hypertension) 08/04/2016   Asthma 08/04/2016   Screening for colon cancer 05/17/2015   Cellulitis 01/24/2015   Cellulitis of right leg 01/23/2015   Skin rash 12/28/2013   Tobacco use 12/28/2013   Spinal stenosis 07/11/2011   Cataracts, bilateral 05/30/2010   History of colonic polyps 12/28/2009   Lymphedema 02/10/2009   Idiopathic urticaria 02/10/2009   Morbid obesity with BMI of 60.0-69.9, adult  ([place]) 02/10/2009   Other abnormal glucose 02/10/2009   Essential hypertension 02/10/2009    Past Surgical History:  Procedure Laterality Date   COLONOSCOPY WITH PROPOFOL N/A 03/12/2018   Procedure: COLONOSCOPY WITH PROPOFOL;  Surgeon: Manya Silvas, MD;  Location: [facility] ENDOSCOPY;  Service: Endoscopy;  Laterality: N/A;   NO PAST SURGERIES      Prior to Admission medications   Medication Sig Start Date End Date Taking? Authorizing Provider  acetaminophen (TYLENOL) 500 MG tablet Take 500-1,000 mg by mouth every 6 (six) hours as needed for mild pain or fever.   Yes [provider]  atenolol (TENORMIN) 50 MG tablet Take 50 mg by mouth daily. 02/29/20  Yes [provider]  cetirizine (ZYRTEC) 10 MG tablet Take 1 tablet (10 mg total) by mouth daily. 12/25/20  Yes Lavina Hamman, MD  furosemide (LASIX) 20 MG tablet Take 1 tablet (20 mg total) by mouth daily. 06/07/19  Yes Wieting, Richard, MD    Allergies Ace inhibitors, Ibuprofen, and Vancomycin  Family History  Problem Relation Age of Onset   Thyroid disease Other    Breast cancer Maternal Aunt     Social History Social History   Tobacco Use   Smoking status: Former    Packs/day: 0.25    Types: Cigarettes    Start date: 07/09/1976    Quit date: 07/09/1990    Years since quitting: 30.5   Smokeless tobacco: Current    Types: Snuff  Vaping Use   Vaping Use: Never used  Substance Use Topics   Alcohol use: No   Drug use: No    Review of Systems  Constitutional: + fever, body aches, chills Eyes: Negative for visual changes. ENT: Negative for sore throat. Neck: No neck pain  Cardiovascular: Negative for chest pain. Respiratory: Negative for shortness of breath. Gastrointestinal: Negative for abdominal pain, vomiting or diarrhea. Genitourinary: Negative for dysuria. Musculoskeletal: Negative for back pain. + b/l leg redness Skin: Negative for rash. Neurological: Negative for weakness or numbness. +  HA Psych: No SI or HI  ____________________________________________   PHYSICAL EXAM:  VITAL SIGNS: ED Triage Vitals  Enc Vitals Group     BP 01/19/21 2307 (!) 158/86     Pulse Rate 01/19/21 2330 (!) 103     Resp 01/19/21 2330 (!) 26     Temp 01/19/21 2307 (!) 103.4 F (39.7 C)     Temp Source 01/19/21 2307 Oral     SpO2 01/19/21 2307 95 %     Weight 01/19/21 2309 (!) 390 lb 6.4 oz (177.1 kg)     Height 01/19/21 2309 5\' 3"  (1.6 m)     Head Circumference --      Peak Flow --      Pain Score 01/19/21 2309 8     Pain Loc --      Pain Edu? --      Excl. in [place]? --     Constitutional: Alert and oriented. Well appearing and in no apparent distress. HEENT:      Head: Normocephalic and atraumatic.         Eyes: Conjunctivae are normal. Sclera is non-icteric.       Mouth/Throat: Mucous membranes are moist.       Neck: Supple with no signs of meningismus. Cardiovascular: Tachycardic with regular rhythm Respiratory: Tachypneic with no hypoxia, clear lungs bilaterally Gastrointestinal: Soft, non tender, and non distended with positive bowel sounds. No rebound or guarding. Genitourinary: No CVA tenderness. Musculoskeletal: Lymphedema bilateral lower extremity and both are warm and erythematous Neurologic: Normal speech and language. Face is symmetric. Moving all extremities. No gross focal neurologic deficits are appreciated. Skin: Skin is warm, dry and intact. No rash noted. Psychiatric: Mood and affect are normal. Speech and behavior are normal.  ____________________________________________   LABS (all labs ordered are listed, but only abnormal results are displayed)  Labs Reviewed  COMPREHENSIVE METABOLIC PANEL - Abnormal; Notable for the following components:      Result Value   Glucose, Bld 103 (*)    BUN 25 (*)    Creatinine, Ser 1.31 (*)    Calcium 8.8 (*)    Total Protein 8.4 (*)    Alkaline Phosphatase 144 (*)    GFR, Estimated 45 (*)    All other components within  normal limits  LACTIC ACID, PLASMA - Abnormal; Notable for the following components:   Lactic Acid, Venous 2.1 (*)    All other components within normal limits  CBC WITH DIFFERENTIAL/PLATELET - Abnormal; Notable for the following components:   WBC 14.6 (*)    Platelets 147 (*)    Neutro Abs 12.7 (*)    Abs Immature Granulocytes 0.16 (*)    All other components within normal limits  URINALYSIS, COMPLETE (UACMP) WITH MICROSCOPIC - Abnormal; Notable for the following components:   Color, Urine STRAW (*)    APPearance CLEAR (*)    All other components within normal limits  RESP PANEL BY RT-PCR (FLU  A&B, COVID) ARPGX2  CULTURE, BLOOD (ROUTINE X 2)  CULTURE, BLOOD (ROUTINE X 2)  URINE CULTURE  LACTIC ACID, PLASMA  PROTIME-INR  APTT  PROCALCITONIN  PROCALCITONIN   ____________________________________________  EKG  ED ECG REPORT I, Rudene Re, the attending physician, personally viewed and interpreted this ECG.  Sinus tachycardia, rate of 104, normal intervals, normal axis, no ST elevations or depressions. ____________________________________________  RADIOLOGY  I have personally reviewed the images performed during this visit and I agree with the Radiologist's read.   Interpretation by Radiologist:  DG Chest Port 1 View  Result Date: 01/20/2021 CLINICAL DATA:  65 year old female with sepsis. EXAM: PORTABLE CHEST 1 VIEW COMPARISON:  Chest radiograph dated 12/23/2020. FINDINGS: There is cardiomegaly with vascular congestion and probable mild edema. No focal consolidation, pleural effusion, or pneumothorax. No acute osseous pathology. IMPRESSION: Cardiomegaly with findings of CHF. Electronically Signed   By: Anner Crete M.D.   On: 01/20/2021 00:20     ____________________________________________   PROCEDURES  Procedure(s) performed:yes .1-3 Lead EKG Interpretation  Date/Time: 01/20/2021 1:46 AM Performed by: Rudene Re, MD Authorized by: Rudene Re, MD     Interpretation: non-specific     ECG rate assessment: tachycardic     Rhythm: sinus tachycardia     Ectopy: none     Conduction: normal     Critical Care performed: yes  CRITICAL CARE Performed by: Rudene Re  ?  Total critical care time: 30 min  Critical care time was exclusive of separately billable procedures and treating other patients.  Critical care was necessary to treat or prevent imminent or life-threatening deterioration.  Critical care was time spent personally by me on the following activities: development of treatment plan with patient and/or surrogate as well as nursing, discussions with consultants, evaluation of patient's response to treatment, examination of patient, obtaining history from patient or surrogate, ordering and performing treatments and interventions, ordering and review of laboratory studies, ordering and review of radiographic studies, pulse oximetry and re-evaluation of patient's condition.  ____________________________________________   INITIAL IMPRESSION / ASSESSMENT AND PLAN / ED COURSE  65 y.o. female with a history of lymphedema, asthma, hypertension, sleep apnea, hypertension who presents for evaluation of fever, chills, body aches, headache, and bilateral leg erythema and warmth.  Patient meet sepsis criteria on arrival with fever, tachycardia, tachypnea.  Both of her legs have chronic changes of lymphedema with overlying erythema and warmth concerning for cellulitis.  Also possibly urosepsis or COVID-19.  Chest x-ray visualized by me with no signs of lobar pneumonia, confirmed by radiology.  Labs consistent with sepsis with a lactic of 2.1, white count of 14.6 with a left shift and an elevated procalcitonin of 2.27.  COVID and flu negative.  Patient was started on sepsis protocol and given cefepime, Vanco, and Flagyl for her cellulitis.  We will discussed with the hospitalist for admission.   Sepsis - Repeat  Assessment  Performed at:    01:48 AM  Vitals     Blood pressure (!) 162/98, pulse 94, temperature 100.3 F (37.9 C), temperature source Oral, resp. rate (!) 33, height 5\' 3"  (1.6 m), weight (!) 177.1 kg, SpO2 96 %.  Heart:     Regular rate and rhythm  Lungs:    CTA  Capillary Refill:   <2 sec  Peripheral Pulse:   Radial pulse palpable  Skin:     Normal Color       _____________________________________________ Please note:  Patient was evaluated in Emergency Department today for  the symptoms described in the history of present illness. Patient was evaluated in the context of the global COVID-19 pandemic, which necessitated consideration that the patient might be at risk for infection with the SARS-CoV-2 virus that causes COVID-19. Institutional protocols and algorithms that pertain to the evaluation of patients at risk for COVID-19 are in a state of rapid change based on information released by regulatory bodies including the CDC and federal and state organizations. These policies and algorithms were followed during the patient's care in the ED.  Some ED evaluations and interventions may be delayed as a result of limited staffing during the pandemic.   [place] Controlled Substance Database was reviewed by me. ____________________________________________   FINAL CLINICAL IMPRESSION(S) / ED DIAGNOSES   Final diagnoses:  Sepsis without acute organ dysfunction, due to unspecified organism ([place])  Cellulitis of lower extremity, unspecified laterality      NEW MEDICATIONS STARTED DURING THIS VISIT:  ED Discharge Orders     None        Note:  This document was prepared using Dragon voice recognition software and may include unintentional dictation errors.    Alfred Levins, [place], MD 01/20/21 [phone]

## 2021-01-20 NOTE — Progress Notes (Signed)
PHARMACIST - PHYSICIAN COMMUNICATION  CONCERNING:  Enoxaparin (Lovenox) for DVT Prophylaxis    RECOMMENDATION: Patient was prescribed enoxaprin 40mg  q24 hours for VTE prophylaxis.   Filed Weights   01/19/21 2309  Weight: (!) 177.1 kg (390 lb 6.4 oz)    Body mass index is 69.16 kg/m.  Estimated Creatinine Clearance: 69.1 mL/min (A) (by C-G formula based on SCr of 1.31 mg/dL (H)).   Based on [place] patient is candidate for enoxaparin 0.5mg /kg TBW SQ every 24 hours based on BMI being >30.  DESCRIPTION: Pharmacy has adjusted enoxaparin dose per [facility] policy.  Patient is now receiving enoxaparin 0.5 mg/kg every 24 hours   Renda Rolls, PharmD, [facility] 01/20/2021 2:22 AM

## 2021-01-20 NOTE — Progress Notes (Signed)
Met with the patient at the bedside She lives at home with her son Sleeps in a hospital bed, has a rolling walker and a cane, she stated that she does not need any additional DME She has had a couple of recent hospitalizations, she is open with [facility], She has transportation with her son, She can afford her medications TOC CM to monitor for additional needs

## 2021-01-20 NOTE — ED Notes (Signed)
Blood Cultures drawn prior to antibiotic initiation.

## 2021-01-20 NOTE — H&P (Signed)
[place]   PATIENT NAME: [name]    MR#:  [id number]  DATE OF BIRTH:  1955/07/21  DATE OF ADMISSION:  01/19/2021  PRIMARY CARE PHYSICIAN: Ranae Plumber, PA   Patient is coming from: Home  REQUESTING/REFERRING PHYSICIAN: Rudene Re, MD  CHIEF COMPLAINT:   Chief Complaint  Patient presents with  . Multiple Complaints    HISTORY OF PRESENT ILLNESS:  [name] is a 65 y.o. African-American female with medical history significant for asthma, hypertension, obstructive sleep apnea, lymphedema, spinal stenosis and osteoarthritis, who presented to the emergency room with acute onset of  fever with associated chills that started yesterday with a temperature that was up to 103.4 here.  She admits to headache without neck pain or stiffness.  No dizziness or blurred vision.  She admits to nausea without vomiting or but had lower abdominal pain over the week.  She admits to bilateral lower extremity worsening swelling with erythema and drainage.  No dysuria, oliguria or hematuria or flank pain.  She gets home health.  ED Course: Upon position emergency room, temperature was 103.4 with a heart rate of 108 and BP 158/86 with otherwise normal vital signs.  Labs revealed negative COVID-19 PCR and influenza antigens.  CBC showed leukocytosis and thrombocytopenia.  INR was 1.2 UA was negative.  Lactic acid was 2.1 and later 1 and procalcitonin 2.27.  CMP was remarkable for slightly elevated BUN of 25 with creatinine 1.3 and alk phos of 144 with troponin of 8.4 otherwise unremarkable CMP.  BUN and creatinine on 6/19 where 17 and 0.96.  EKG as reviewed by me : Showed sinus tachycardia with rate 104 Imaging: Portable chest ray showed cardiomegaly with findings of CHF.  The patient was given IV cefepime and vancomycin and Flagyl, 25 mg IV Benadryl, lactated Ringer 1.6 L and 1 g of p.o. Tylenol as well as 150 mL/h of IV lactated Ringer.  She will be admitted to a medical bed  for further evaluation and management. PAST MEDICAL HISTORY:   Past Medical History:  Diagnosis Date  . Arthritis   . Asthma   . Enlarged heart   . Hypertension   . Lymphedema   . Sleep apnea   . Spinal stenosis     PAST SURGICAL HISTORY:   Past Surgical History:  Procedure Laterality Date  . COLONOSCOPY WITH PROPOFOL N/A 03/12/2018   Procedure: COLONOSCOPY WITH PROPOFOL;  Surgeon: Manya Silvas, MD;  Location: [facility] ENDOSCOPY;  Service: Endoscopy;  Laterality: N/A;  . NO PAST SURGERIES      SOCIAL HISTORY:   Social History   Tobacco Use  . Smoking status: Former    Packs/day: 0.25    Types: Cigarettes    Start date: 07/09/1976    Quit date: 07/09/1990    Years since quitting: 30.5  . Smokeless tobacco: Current    Types: Snuff  Substance Use Topics  . Alcohol use: No    FAMILY HISTORY:   Family History  Problem Relation Age of Onset  . Thyroid disease Other   . Breast cancer Maternal Aunt     DRUG ALLERGIES:   Allergies  Allergen Reactions  . Ace Inhibitors Hives and Swelling  . Ibuprofen Hives and Swelling  . Vancomycin Itching and Nausea And Vomiting    REVIEW OF SYSTEMS:   ROS As per history of present illness. All pertinent systems were reviewed above. Constitutional, HEENT, cardiovascular, respiratory, GI, GU, musculoskeletal, neuro, psychiatric, endocrine, integumentary and hematologic  systems were reviewed and are otherwise negative/unremarkable except for positive findings mentioned above in the HPI.   MEDICATIONS AT HOME:   Prior to Admission medications   Medication Sig Start Date End Date Taking? Authorizing Provider  acetaminophen (TYLENOL) 500 MG tablet Take 500-1,000 mg by mouth every 6 (six) hours as needed for mild pain or fever.   Yes [provider]  atenolol (TENORMIN) 50 MG tablet Take 50 mg by mouth daily. 02/29/20  Yes [provider]  cetirizine (ZYRTEC) 10 MG tablet Take 1 tablet (10 mg total) by mouth daily.  12/25/20  Yes Lavina Hamman, MD  furosemide (LASIX) 20 MG tablet Take 1 tablet (20 mg total) by mouth daily. 06/07/19  Yes Wieting, Richard, MD      VITAL SIGNS:  Blood pressure (!) 162/98, pulse 94, temperature 100.3 F (37.9 C), temperature source Oral, resp. rate (!) 33, height _0  (1.6 m), weight (!) 177.1 kg, SpO2 96 %.  PHYSICAL EXAMINATION:  Physical Exam  GENERAL:  65 y.o.-year-old African-American female patient lying in the bed with no acute distress.  EYES: Pupils equal, round, reactive to light and accommodation. No scleral icterus. Extraocular muscles intact.  HEENT: Head atraumatic, normocephalic. Oropharynx and nasopharynx clear.  NECK:  Supple, no jugular venous distention. No thyroid enlargement, no tenderness.  LUNGS: Normal breath sounds bilaterally, no wheezing, rales,rhonchi or crepitation. No use of accessory muscles of respiration.  CARDIOVASCULAR: Regular rate and rhythm, S1, S2 normal. No murmurs, rubs, or gallops.  ABDOMEN: Soft, nondistended, nontender. Bowel sounds present. No organomegaly or mass.  EXTREMITIES: No cyanosis, or clubbing.  NEUROLOGIC: Cranial nerves II through XII are intact. Muscle strength 5/5 in all extremities. Sensation intact. Gait not checked.  PSYCHIATRIC: The patient is alert and oriented x 3.  Normal affect and good eye contact. SKIN: Bilateral leg erythema and swelling more on the right side with associated tenderness and lichenification in the setting of severe lymphedema with elephantiasis.   LABORATORY PANEL:   CBC Recent Labs  Lab 01/19/21 2342  WBC 14.6*  HGB 12.6  HCT 40.7  PLT 147*   ------------------------------------------------------------------------------------------------------------------  Chemistries  Recent Labs  Lab 01/19/21 2342  NA 136  K 4.8  CL 101  CO2 29  GLUCOSE 103*  BUN 25*  CREATININE 1.31*  CALCIUM 8.8*  AST 25  ALT 14  ALKPHOS 144*  BILITOT 1.0    ------------------------------------------------------------------------------------------------------------------  Cardiac Enzymes No results for input(s): TROPONINI in the last 168 hours. ------------------------------------------------------------------------------------------------------------------  RADIOLOGY:  DG Chest Port 1 View  Result Date: 01/20/2021 CLINICAL DATA:  65 year old female with sepsis. EXAM: PORTABLE CHEST 1 VIEW COMPARISON:  Chest radiograph dated 12/23/2020. FINDINGS: There is cardiomegaly with vascular congestion and probable mild edema. No focal consolidation, pleural effusion, or pneumothorax. No acute osseous pathology. IMPRESSION: Cardiomegaly with findings of CHF. Electronically Signed   By: Anner Crete M.D.   On: 01/20/2021 00:20      IMPRESSION AND PLAN:  Active Problems:   Sepsis due to cellulitis ([place])  1.  Bilateral lower extremity cellulitis more on the right than the left with subsequent sepsis as manifested by tachycardia, tachypnea with high fever. - The patient will be admitted to a medical monitored bed. - We will continue antibiotic therapy with IV cefepime, vancomycin and Flagyl for her nonpurulent severe cellulitis. - She will be hydrated with IV normal saline. - We will follow blood cultures.  2.  Essential hypertension. - We will continue Tenormin.  3.  Obstructive  apnea. - We will continue her CPAP nightly.  4.  Asthma without exacerbation. - We will continue her   DVT prophylaxis: Lovenox.  Code Status: full code.  Family Communication:  The plan of care was discussed in details with the patient (and family). I answered all questions. The patient agreed to proceed with the above mentioned plan. Further management will depend upon hospital course. Disposition Plan: Back to previous home environment Consults called: none.  All the records are reviewed and case discussed with ED provider.  Status is: Inpatient  Remains  inpatient appropriate because:Ongoing diagnostic testing needed not appropriate for outpatient work up, Unsafe d/c plan, IV treatments appropriate due to intensity of illness or inability to take PO, and Inpatient level of care appropriate due to severity of illness  Dispo: The patient is from: Home              Anticipated d/c is to: Home              Patient currently is not medically stable to d/c.   Difficult to place patient No    TOTAL TIME TAKING CARE OF THIS PATIENT: 55 minutes.    Christel Mormon M.D on 01/20/2021 at 2:01 AM  [facility]   From 7 PM-7 AM, contact night-coverage [email or web address]  CC: Primary care physician; Ranae Plumber, [place]

## 2021-01-20 NOTE — Progress Notes (Signed)
[place] OF CARE NOTE Patient: [name] JQB:[id number]   PCP: Ranae Plumber, [place] DOB: Mar 09, 1956   DOA: 01/19/2021   DOS: 01/20/2021    Patient was admitted by my colleague earlier on 01/20/2021. I have reviewed the H&P as well as assessment and plan and agree with the same. Important changes in the plan are listed below.  Plan of care: Active Problems:   Sepsis due to cellulitis [facility]) Continue with antibiotics Will discuss with ID regarding long-term plan.  Author: Berle Mull, MD [facility] 01/20/2021 6:33 PM   If 7PM-7AM, please contact night-coverage at [email or web address]

## 2021-01-20 NOTE — Progress Notes (Signed)
Pharmacy Antibiotic Note  [name] is a 65 y.o. female admitted on 01/19/2021 with cellulitis.  Pharmacy has been consulted for Cefepime and Vancomycin dosing.  -Pt with documented allergy to Vancomycin causing N/V & itching.  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  Plan: Ordered Cefepime 2 gm q8h per indication and renal fxn.  Vanc: Pt given Vanc 2 gram in ED on 7/15 at 0111.   Vancomycin 1500 mg IV Q 24 hrs.  Goal AUC 400-550. Expected AUC: 501.2 SCr used: 1.31,Vd: 0.5, BMI 69.2   7/15:  Scr improved 1.31>1.14.  Will adjust Vancomycin to 1750mg  IV q24h Goal AUC 400-550. Expected AUC: 517 SCr used: 1.14  Cmin 14.2    obese BMI 69- *used Vd of 0.5 in calculations     Pharmacy will continue to follow and will adjust abx dosing whenever warranted.   Height: 5\' 3"  (160 cm) Weight: (!) 177.1 kg (390 lb 6.4 oz) IBW/kg (Calculated) : 52.4  Temp (24hrs), Avg:100.6 F (38.1 C), Min:98.5 F (36.9 C), Max:103.4 F (39.7 C)  Recent Labs  Lab 01/19/21 2342 01/20/21 0115 01/20/21 0523 01/20/21 0547  WBC 14.6*  --   --  14.8*  CREATININE 1.31*  --  1.14*  --   LATICACIDVEN 2.1* 1.0  --   --      Estimated Creatinine Clearance: 79.5 mL/min (A) (by C-G formula based on SCr of 1.14 mg/dL (H)).    Allergies  Allergen Reactions   Ace Inhibitors Hives and Swelling   Ibuprofen Hives and Swelling   Vancomycin Itching and Nausea And Vomiting    Antimicrobials this admission: 07/14 Cefepime >> 07/14 Flagyl >>  07/15 Vancomycin >>    Microbiology results: 07/14 BCx: Pending 07/14 UCx: Pending   Thank you for allowing pharmacy to be a part of this patient's care.  Chinita Greenland PharmD Clinical Pharmacist 01/20/2021

## 2021-01-21 DIAGNOSIS — A419 Sepsis, unspecified organism: Secondary | ICD-10-CM | POA: Diagnosis not present

## 2021-01-21 LAB — BASIC METABOLIC PANEL
Anion gap: 6 (ref 5–15)
BUN: 18 mg/dL (ref 8–23)
CO2: 26 mmol/L (ref 22–32)
Calcium: 8.2 mg/dL — ABNORMAL LOW (ref 8.9–10.3)
Chloride: 106 mmol/L (ref 98–111)
Creatinine, Ser: 0.83 mg/dL (ref 0.44–1.00)
GFR, Estimated: >60 mL/min (ref >60)
Glucose, Bld: 105 mg/dL — ABNORMAL HIGH (ref 70–99)
Potassium: 3.8 mmol/L (ref 3.5–5.1)
Sodium: 138 mmol/L (ref 135–145)

## 2021-01-21 LAB — CBC
HCT: 34.1 % — ABNORMAL LOW (ref 36.0–46.0)
Hemoglobin: 10.7 g/dL — ABNORMAL LOW (ref 12.0–15.0)
MCH: 27 pg (ref 26.0–34.0)
MCHC: 31.4 g/dL (ref 30.0–36.0)
MCV: 86.1 fL (ref 80.0–100.0)
Platelets: 123 10*3/uL — ABNORMAL LOW (ref 150–400)
RBC: 3.96 MIL/uL (ref 3.87–5.11)
RDW: 14.4 % (ref 11.5–15.5)
WBC: 6.4 10*3/uL (ref 4.0–10.5)
nRBC: 0 % (ref 0.0–0.2)

## 2021-01-21 LAB — URINE CULTURE: Culture: <10000 — AB

## 2021-01-21 LAB — MAGNESIUM: Magnesium: 1.9 mg/dL (ref 1.7–2.4)

## 2021-01-21 MED ORDER — DOXYCYCLINE HYCLATE 100 MG PO TABS: 100.0000 mg | ORAL_TABLET | Freq: Two times a day (BID) | ORAL | Status: DC

## 2021-01-21 MED ORDER — DOXYCYCLINE HYCLATE 100 MG PO TABS: 100.0000 mg | ORAL_TABLET | Freq: Two times a day (BID) | ORAL | 0 refills | Status: AC

## 2021-01-21 MED ORDER — CEPHALEXIN 500 MG PO CAPS: 500.0000 mg | ORAL_CAPSULE | Freq: Three times a day (TID) | ORAL | 0 refills | Status: AC

## 2021-01-21 MED ORDER — CEPHALEXIN 500 MG PO CAPS
500.0000 mg | ORAL_CAPSULE | Freq: Four times a day (QID) | ORAL | Status: DC
  Administered 2021-01-21 (×2): 500 mg via ORAL
  Filled 2021-01-21: qty 1

## 2021-01-21 MED ORDER — METHOCARBAMOL 500 MG PO TABS: 500.0000 mg | ORAL_TABLET | Freq: Three times a day (TID) | ORAL | 0 refills | Status: DC | PRN

## 2021-01-21 NOTE — TOC Transition Note (Addendum)
Transition of Care [facility]) - CM/SW Discharge Note   Patient Details  Name: [name] MRN: [id number] Date of Birth: August 26, 1955  Transition of Care [facility]) CM/SW Contact:  Izola Price, RN Phone Number: 01/21/2021, 5:09 PM   Clinical Narrative:  Patient has discharge orders. Confirmed with [facility] they will resume services. Has all needed DME at home. Son to transport patient home. [name] RN CM   Update: 530 pm. Patient now states she is unable to ride in son's car and he is at work. CM spoke with son and he will not be home till after 8 pm tonight. Called [name] to arrange transport to home and gave them the Unit Nurses number to contact as well. Informed [name] no one would be at home when they transport patient. Son will be home at 5 pm tomorrow and there is an uncle that could be at the house is required by [facility]. Medical [place] Facesheet printed to unit printer. Communicated all this to Unit/patient nurse and gave the [name] number if needed to call re transport. Asked unit nurse to communicate final transportation plans to son. CM messaged him as well. [name] RN CM     Final next level of care: [place] Barriers to Discharge: Barriers Resolved   Patient Goals and CMS Choice Patient states their goals for this hospitalization and ongoing recovery are:: get better      Discharge Placement                       Discharge Plan and Services   Discharge Planning Services: CM Consult            DME Arranged: N/A         HH Arranged: RN, PT [place] Agency: [place] Date [place]: 01/21/21 Time [place]: 1708 Representative spoke with at [place]: Tommi Rumps at [place] (Confirmed will resume services.)  Social Determinants of Health (SDOH) Interventions     Readmission Risk Interventions Readmission Risk Prevention Plan 01/20/2021  Transportation Screening Complete  PCP or Specialist Appt within 5-7  Days Complete  Home Care Screening Complete  Medication Review (RN CM) Referral to Pharmacy  Some recent data might be hidden

## 2021-01-21 NOTE — Progress Notes (Signed)
PHARMACY NOTE:  ANTIMICROBIAL RENAL DOSAGE ADJUSTMENT  Current antimicrobial regimen includes a mismatch between antimicrobial dosage and estimated renal function.  As per policy approved by the Pharmacy & Therapeutics and Medical Executive Committees, the antimicrobial dosage will be adjusted accordingly.  Current antimicrobial dosage:  Keflex 500 mg TID  Indication: Cellulitis  Renal Function:  Estimated Creatinine Clearance: 109.1 mL/min (by C-G formula based on SCr of 0.83 mg/dL).    Antimicrobial dosage has been changed to:  Keflex 500 mg QID  Additional comments: BMI 69   Thank you for allowing pharmacy to be a part of this patient's care.  Benita Gutter, [facility] 01/21/2021 8:30 AM

## 2021-01-21 NOTE — Discharge Summary (Signed)
[facility] Discharge Summary   Patient: [name] YTK:[id number]  PCP: Ranae Plumber, [place]  Date of admission: 01/19/2021   Date of discharge:  01/21/2021     Discharge Diagnoses:  Principal diagnosis Sepsis secondary to cellulitis.  Active Problems:   Sepsis due to cellulitis [facility]) Chronic lymphedema  Admitted From: home Disposition:  Home with home health  Recommendations for Outpatient Follow-up:  PCP: Follow-up with PCP in 1 week   Follow-up Information     Ranae Plumber, [place]. Schedule an appointment as soon as possible for a visit in 2 week(s).   Specialty: Family Medicine Contact information: [place] [address] [phone]                Discharge Instructions     Diet - low sodium heart healthy   Complete by: As directed    Increase activity slowly   Complete by: As directed        Diet recommendation: Cardiac diet  Activity: The patient is advised to gradually reintroduce usual activities, as tolerated  Discharge Condition: stable  Code Status: Full code   History of present illness: As per the H and P dictated on admission, "[name] is a 65 y.o. African-American female with medical history significant for asthma, hypertension, obstructive sleep apnea, lymphedema, spinal stenosis and osteoarthritis, who presented to the emergency room with acute onset of  fever with associated chills that started yesterday with a temperature that was up to 103.4 here.  She admits to headache without neck pain or stiffness.  No dizziness or blurred vision.  She admits to nausea without vomiting or but had lower abdominal pain over the week.  She admits to bilateral lower extremity worsening swelling with erythema and drainage.  No dysuria, oliguria or hematuria or flank pain.  She gets home health.   ED Course: Upon position emergency room, temperature was 103.4 with a heart rate of 108 and BP 158/86 with otherwise normal  vital signs.  Labs revealed negative COVID-19 PCR and influenza antigens.  CBC showed leukocytosis and thrombocytopenia.  INR was 1.2 UA was negative.  Lactic acid was 2.1 and later 1 and procalcitonin 2.27.  CMP was remarkable for slightly elevated BUN of 25 with creatinine 1.3 and alk phos of 144 with troponin of 8.4 otherwise unremarkable CMP.  BUN and creatinine on 6/19 where 17 and 0.96."  Hospital Course:  Summary of her active problems in the hospital is as following. 1.  Sepsis due to cellulitis of the right lower extremity.  Present on admission Lymphedema bilaterally. Met SIRS criteria on admission. Has chronic lymphedema. Currently redness improving. Treated with IV cefepime, Flagyl and vancomycin. Blood cultures negative for 2 days. Since clinically improving we will transition to oral doxycycline and Keflex for 6-day   2.  HTN Blood pressure is improved significantly.  We will resume home regimen on discharge.   3.  Bilateral chronic lymphedema. Continue home care.    4.  Asthma. Continue as needed nebulizers and inhalers.   5.  Morbid obesity. OSA on CPAP. Continue CPAP nightly.  Body mass index is 69.16 kg/m.   Patient was ambulatory without any assistance. On the day of the discharge the patient's vitals were stable, and no other new acute medical condition were reported. The patient was felt safe to be discharge at Home with Home health.  Consultants: none Procedures: none  DISCHARGE MEDICATION: Allergies as of 01/21/2021       Reactions  Ace Inhibitors Hives, Swelling   Ibuprofen Hives, Swelling   Vancomycin Itching, Nausea And Vomiting        Medication List     TAKE these medications    acetaminophen 500 MG tablet Commonly known as: TYLENOL Take 500-1,000 mg by mouth every 6 (six) hours as needed for mild pain or fever.   atenolol 50 MG tablet Commonly known as: TENORMIN Take 50 mg by mouth daily.   cephALEXin 500 MG capsule Commonly  known as: KEFLEX Take 1 capsule (500 mg total) by mouth 3 (three) times daily for 6 days.   cetirizine 10 MG tablet Commonly known as: ZYRTEC Take 1 tablet (10 mg total) by mouth daily.   doxycycline 100 MG tablet Commonly known as: VIBRA-TABS Take 1 tablet (100 mg total) by mouth 2 (two) times daily for 6 days.   furosemide 20 MG tablet Commonly known as: LASIX Take 1 tablet (20 mg total) by mouth daily.   methocarbamol 500 MG tablet Commonly known as: ROBAXIN Take 1 tablet (500 mg total) by mouth every 8 (eight) hours as needed for muscle spasms.        Discharge Exam: Filed Weights   01/19/21 2309  Weight: (!) 177.1 kg   Vitals:   01/21/21 0858 01/21/21 1508  BP:  127/67  Pulse:  63  Resp:  14  Temp: 98.2 F (36.8 C) 98.6 F (37 C)  SpO2:  100%   General: Appear in mild distress, no Rash; Oral Mucosa Clear, moist. no Abnormal Neck Mass Or lumps, Conjunctiva normal  Cardiovascular: S1 and S2 Present, no Murmur, Respiratory: good respiratory effort, Bilateral Air entry present and CTA, no Crackles, no wheezes Abdomen: Bowel Sound present, Soft and no tenderness Extremities: Chronic lymphedema pedal edema Neurology: alert and oriented to time, place, and person affect appropriate. no new focal deficit Gait not checked due to patient safety concerns  The results of significant diagnostics from this hospitalization (including imaging, microbiology, ancillary and laboratory) are listed below for reference.    Significant Diagnostic Studies: DG Chest Port 1 View  Result Date: 01/20/2021 CLINICAL DATA:  65 year old female with sepsis. EXAM: PORTABLE CHEST 1 VIEW COMPARISON:  Chest radiograph dated 12/23/2020. FINDINGS: There is cardiomegaly with vascular congestion and probable mild edema. No focal consolidation, pleural effusion, or pneumothorax. No acute osseous pathology. IMPRESSION: Cardiomegaly with findings of CHF. Electronically Signed   By: Anner Crete M.D.    On: 01/20/2021 00:20   DG Chest Portable 1 View  Result Date: 12/23/2020 CLINICAL DATA:  Body ache and fever EXAM: PORTABLE CHEST 1 VIEW COMPARISON:  09/24/2020, 04/12/2019 FINDINGS: Cardiomegaly with vascular congestion and probable mild edema. No pleural effusion. No consolidation or pneumothorax. IMPRESSION: Cardiomegaly with vascular congestion and probable mild edema Electronically Signed   By: Donavan Foil M.D.   On: 12/23/2020 22:14   CT EXTREMITY LOWER RIGHT W CONTRAST  Result Date: 12/24/2020 CLINICAL DATA:  Right lower extremity cellulitis, lymphedema EXAM: CT OF THE LOWER RIGHT EXTREMITY WITH CONTRAST TECHNIQUE: Multidetector CT imaging of the lower right extremity was performed according to the standard protocol following intravenous contrast administration. CONTRAST:  119m OMNIPAQUE IOHEXOL 300 MG/ML  SOLN COMPARISON:  CT 01/23/2015.  X-ray 06/01/2019 FINDINGS: Bones/Joint/Cartilage No acute fracture or dislocation. Severe tricompartmental osteoarthritis of the right knee, most pronounced within the medial compartment. Trace right knee joint effusion, nonspecific. Mild arthropathy of the ankle and foot. Small subchondral cystic changes of the talar dome. No bony destruction or periosteal elevation is  identified involving the right lower extremity. No suspicious lytic or sclerotic bone lesion. Ligaments Suboptimally assessed by CT. Muscles and Tendons Diffuse mild fatty infiltration of the lower leg musculature. No acute tendon abnormality. No appreciable tenosynovial fluid collection. Soft tissues Marked circumferential subcutaneous edema with prominent skin thickening throughout the right lower extremity compatible with history of chronic lymphedema. No organized or drainable fluid collection. No soft tissue gas. No deep soft tissue ulceration. Enlarged bilateral inguinal lymph nodes measuring up to 2.2 cm on the right and 1.7 cm on the left. Prominent bilateral external iliac lymph nodes,  largest is on the right and measures 17 mm in diameter. No acute findings are seen within the visualized pelvis. IMPRESSION: 1. No CT evidence of acute osteomyelitis of the right lower extremity. 2. Marked circumferential subcutaneous edema with prominent skin thickening throughout the right lower extremity compatible with history of chronic lymphedema. Superimposed cellulitis not excluded. No organized or drainable fluid collection. No soft tissue gas. 3. Enlarged bilateral inguinal and external iliac lymph nodes are nonspecific but most likely secondary to lymphedema. 4. Severe tricompartmental osteoarthritis of the right knee, most pronounced within the medial compartment. Trace right knee joint effusion, nonspecific. Electronically Signed   By: Davina Poke D.O.   On: 12/24/2020 19:48    Microbiology: Recent Results (from the past 240 hour(s))  Culture, blood (Routine x 2)     Status: None (Preliminary result)   Collection Time: 01/19/21 11:25 PM   Specimen: BLOOD  Result Value Ref Range Status   Specimen Description BLOOD RIGHT SHOULDER  Final   Special Requests   Final    BOTTLES DRAWN AEROBIC AND ANAEROBIC Blood Culture adequate volume   Culture   Final    NO GROWTH 2 DAYS Performed at [facility], [address] [place], [place] [address]    Report Status PENDING  Incomplete  Resp Panel by RT-PCR (Flu A&B, Covid) Nasopharyngeal Swab     Status: None   Collection Time: 01/19/21 11:25 PM   Specimen: Nasopharyngeal Swab; Nasopharyngeal(NP) swabs in vial transport medium  Result Value Ref Range Status   SARS Coronavirus 2 by RT PCR NEGATIVE NEGATIVE Final    Comment: (NOTE) SARS-CoV-2 target nucleic acids are NOT DETECTED.  The SARS-CoV-2 RNA is generally detectable in upper respiratory specimens during the acute phase of infection. The lowest concentration of SARS-CoV-2 viral copies this assay can detect is 138 copies/mL. A negative result does not preclude  SARS-Cov-2 infection and should not be used as the sole basis for treatment or other patient management decisions. A negative result may occur with  improper specimen collection/handling, submission of specimen other than nasopharyngeal swab, presence of viral mutation(s) within the areas targeted by this assay, and inadequate number of viral copies(<138 copies/mL). A negative result must be combined with clinical observations, patient history, and epidemiological information. The expected result is Negative.  Fact Sheet for Patients:  EntrepreneurPulse.com.au  Fact Sheet for Healthcare Providers:  IncredibleEmployment.be  This test is no t yet approved or cleared by the [place] FDA and  has been authorized for detection and/or diagnosis of SARS-CoV-2 by FDA under an Emergency Use Authorization (EUA). This EUA will remain  in effect (meaning this test can be used) for the duration of the COVID-19 declaration under Section 564(b)(1) of the Act, 21 U.S.C.section 360bbb-3(b)(1), unless the authorization is terminated  or revoked sooner.       Influenza A by PCR NEGATIVE NEGATIVE Final   Influenza B by  PCR NEGATIVE NEGATIVE Final    Comment: (NOTE) The Xpert Xpress SARS-CoV-2/FLU/RSV plus assay is intended as an aid in the diagnosis of influenza from Nasopharyngeal swab specimens and should not be used as a sole basis for treatment. Nasal washings and aspirates are unacceptable for Xpert Xpress SARS-CoV-2/FLU/RSV testing.  Fact Sheet for Patients: EntrepreneurPulse.com.au  Fact Sheet for Healthcare Providers: IncredibleEmployment.be  This test is not yet approved or cleared by the [place] FDA and has been authorized for detection and/or diagnosis of SARS-CoV-2 by FDA under an Emergency Use Authorization (EUA). This EUA will remain in effect (meaning this test can be used) for the duration of  the COVID-19 declaration under Section 564(b)(1) of the Act, 21 U.S.C. section 360bbb-3(b)(1), unless the authorization is terminated or revoked.  Performed at [facility], [address] [place], [place] [address]   Culture, blood (Routine x 2)     Status: None (Preliminary result)   Collection Time: 01/19/21 11:26 PM   Specimen: BLOOD  Result Value Ref Range Status   Specimen Description BLOOD LEFT ANTECUBITAL  Final   Special Requests   Final    BOTTLES DRAWN AEROBIC ONLY Blood Culture results may not be optimal due to an inadequate volume of blood received in culture bottles   Culture   Final    NO GROWTH 2 DAYS Performed at [facility], [address] [place], [place] [address]    Report Status PENDING  Incomplete  Urine Culture     Status: Abnormal   Collection Time: 01/20/21 12:50 AM   Specimen: Urine, Random  Result Value Ref Range Status   Specimen Description   Final    URINE, RANDOM Performed at [facility], [address] [place], [place] [address]    Special Requests   Final    NONE Performed at [facility], [address], [place], [place] [address]    Culture (A)  Final    <10,000 COLONIES/mL INSIGNIFICANT GROWTH Performed at [facility], [place] [address], [place], [place] [address]    Report Status 01/21/2021 FINAL  Final     Labs: CBC: Recent Labs  Lab 01/19/21 2342 01/20/21 0547 01/21/21 0538  WBC 14.6* 14.8* 6.4  NEUTROABS 12.7*  --   --   HGB 12.6 12.3 10.7*  HCT 40.7 38.9 34.1*  MCV 85.5 83.8 86.1  PLT 147* 143* 275*   Basic Metabolic Panel: Recent Labs  Lab 01/19/21 2342 01/20/21 0523 01/21/21 0538  NA 136 135 138  K 4.8 4.0 3.8  CL 101 104 106  CO2 _0 GLUCOSE 103* 93 105*  BUN 25* 21 18  CREATININE 1.31* 1.14* 0.83  CALCIUM 8.8* 8.4* 8.2*  MG  --   --  1.9   Liver Function Tests: Recent Labs  Lab 01/19/21 2342  AST 25  ALT 14  ALKPHOS 144*  BILITOT 1.0  PROT  8.4*  ALBUMIN 3.5   CBG: No results for input(s): GLUCAP in the last 168 hours.  Time spent: 35 minutes  Signed:  Berle Mull  [facility] 01/21/2021

## 2021-01-24 LAB — CULTURE, BLOOD (ROUTINE X 2)
Culture: NO GROWTH
Culture: NO GROWTH
Special Requests: ADEQUATE

## 2021-03-20 ENCOUNTER — Other Ambulatory Visit: Payer: Self-pay

## 2021-03-20 ENCOUNTER — Ambulatory Visit: Payer: Medicare HMO | Admitting: Internal Medicine

## 2021-03-20 NOTE — Progress Notes (Signed)
Sleep Medicine   Office Visit  Patient Name: [name] DOB: 04/03/1965 MRN [id number]    Chief Complaint: ***  Brief History:  Elizabeth presents for initial sleep consult with a *** history of ***. Sleep quality is ***. This is noted *** nights. The patient's bed partner reports  *** at night. The patient relates the following symptoms: *** are also present. The patient goes to sleep at *** and wakes up at ***.   Sleep quality is *** when outside home environment.  Patient has noted *** of her legs at night.  The patient  relates *** behavior during the night.  The patient *** a history of psychiatric problems. The Epworth Sleepiness Score is *** out of 24 .  The patient relates  Cardiovascular risk factors include: *** The patient reports ***    ROS  General: (-) fever, (-) chills, (-) night sweat Nose and Sinuses: (-) nasal stuffiness or itchiness, (-) postnasal drip, (-) nosebleeds, (-) sinus trouble. Mouth and Throat: (-) sore throat, (-) hoarseness. Neck: (-) swollen glands, (-) enlarged thyroid, (-) neck pain. Respiratory: *** cough, *** shortness of breath, *** wheezing. Neurologic: *** numbness, *** tingling. Psychiatric: *** anxiety, *** depression Sleep behavior: ***sleep paralysis ***hypnogogic hallucinations ***dream enactment      ***vivid dreams ***cataplexy ***night terrors ***sleep walking   Current Medication: No outpatient encounter medications on file as of 08/30/2022.   No facility-administered encounter medications on file as of 08/30/2022.    Surgical History: *** The histories are not reviewed yet. Please review them in the "History" navigator section and refresh this SmartLink.  Medical History: No past medical history on file.  Family History: Non contributory to the present illness  Social History: Social History   Socioeconomic History   Marital status: Not on file    Spouse name: Not on file   Number of children: Not on file   Years of  education: Not on file   Highest education level: Not on file  Occupational History   Not on file  Tobacco Use   Smoking status: Not on file   Smokeless tobacco: Not on file  Substance and Sexual Activity   Alcohol use: Not on file   Drug use: Not on file   Sexual activity: Not on file  Other Topics Concern   Not on file  Social History Narrative   Not on file   Social Determinants of Health   Financial Resource Strain: Not on file  Food Insecurity: Not on file  Transportation Needs: Not on file  Physical Activity: Not on file  Stress: Not on file  Social Connections: Not on file  Intimate Partner Violence: Not on file    Vital Signs: There were no vitals taken for this visit. There is no height or weight on file to calculate BMI.   Examination: General Appearance: The patient is well-developed, well-nourished, and in no distress. Neck Circumference: *** Skin: Gross inspection of skin unremarkable. Head: normocephalic, no gross deformities. Eyes: no gross deformities noted. ENT: ears appear grossly normal Neurologic: Alert and oriented. No involuntary movements.    STOP BANG RISK ASSESSMENT S (snore) Have you been told that you snore?     YES/N   T (tired) Are you often tired, fatigued, or sleepy during the day?   YES/NO  O (obstruction) Do you stop breathing, choke, or gasp during sleep? YES/NO   P (pressure) Do you have or are you being treated for high blood pressure? YES/NO   B (

## 2021-04-14 ENCOUNTER — Ambulatory Visit: Payer: Medicare HMO

## 2021-04-17 ENCOUNTER — Ambulatory Visit (INDEPENDENT_AMBULATORY_CARE_PROVIDER_SITE_OTHER): Payer: Medicare HMO | Admitting: Internal Medicine

## 2021-04-17 ENCOUNTER — Other Ambulatory Visit: Payer: Self-pay

## 2021-04-17 VITALS — BP 151/83 | HR 59 | Resp 20 | Ht 64.0 in | Wt 369.0 lb

## 2021-04-17 DIAGNOSIS — G4733 Obstructive sleep apnea (adult) (pediatric): Secondary | ICD-10-CM

## 2021-04-17 DIAGNOSIS — I10 Essential (primary) hypertension: Secondary | ICD-10-CM

## 2021-04-17 DIAGNOSIS — Z6841 Body Mass Index (BMI) 40.0 and over, adult: Secondary | ICD-10-CM

## 2021-04-17 DIAGNOSIS — Z7189 Other specified counseling: Secondary | ICD-10-CM | POA: Diagnosis not present

## 2021-04-17 DIAGNOSIS — Z9989 Dependence on other enabling machines and devices: Secondary | ICD-10-CM

## 2021-04-17 NOTE — Patient Instructions (Signed)
CPAP and BPAP Information CPAP and BPAP (also called BiPAP) are methods that use air pressure to keep your airways open and to help you breathe well. CPAP and BPAP use different amounts of pressure. Your health care provider will tell you whether CPAP or BPAP would be more helpful for you. CPAP stands for "continuous positive airway pressure." With CPAP, the amount of pressure stays the same while you breathe in (inhale) and out (exhale). BPAP stands for "bi-level positive airway pressure." With BPAP, the amount of pressure will be higher when you inhale and lower when you exhale. This allows you to take larger breaths. CPAP or BPAP may be used in the hospital, or your health care provider may want you to use it at home. You may need to have a sleep study before your health care provider can order a machine for you to use at home. What are the advantages? CPAP or BPAP can be helpful if you have: Sleep apnea. Chronic obstructive pulmonary disease (COPD). Heart failure. Medical conditions that cause muscle weakness, including muscular dystrophy or amyotrophic lateral sclerosis (ALS). Other problems that cause breathing to be shallow, weak, abnormal, or difficult. CPAP and BPAP are most commonly used for obstructive sleep apnea (OSA) to keep the airways from collapsing when the muscles relax during sleep. What are the risks? Generally, this is a safe treatment. However, problems may occur, including: Irritated skin or skin sores if the mask does not fit properly. Dry or stuffy nose or nosebleeds. Dry mouth. Feeling gassy or bloated. Sinus or lung infection if the equipment is not cleaned properly. When should CPAP or BPAP be used? In most cases, the mask only needs to be worn during sleep. Generally, the mask needs to be worn throughout the night and during any daytime naps. People with certain medical conditions may also need to wear the mask at other times, such as when they are awake. Follow  instructions from your health care provider about when to use the machine. What happens during CPAP or BPAP? Both CPAP and BPAP are provided by a small machine with a flexible plastic tube that attaches to a plastic mask that you wear. Air is blown through the mask into your nose or mouth. The amount of pressure that is used to blow the air can be adjusted on the machine. Your health care provider will set the pressure setting and help you find the best mask for you. Tips for using the mask Because the mask needs to be snug, some people feel trapped or closed-in (claustrophobic) when first using the mask. If you feel this way, you may need to get used to the mask. One way to do this is to hold the mask loosely over your nose or mouth and then gradually apply the mask more snugly. You can also gradually increase the amount of time that you use the mask. Masks are available in various types and sizes. If your mask does not fit well, talk with your health care provider about getting a different one. Some common types of masks include: Full face masks, which fit over the mouth and nose. Nasal masks, which fit over the nose. Nasal pillow or prong masks, which fit into the nostrils. If you are using a mask that fits over your nose and you tend to breathe through your mouth, a chin strap may be applied to help keep your mouth closed. Use a skin barrier to protect your skin as told by your health care provider.   Some CPAP and BPAP machines have alarms that may sound if the mask comes off or develops a leak. If you have trouble with the mask, it is very important that you talk with your health care provider about finding a way to make the mask easier to tolerate. Do not stop using the mask. There could be a negative impact on your health if you stop using the mask. Tips for using the machine Place your CPAP or BPAP machine on a secure table or stand near an electrical outlet. Know where the on/off switch is on  the machine. Follow instructions from your health care provider about how to set the pressure on your machine and when you should use it. Do not eat or drink while the CPAP or BPAP machine is on. Food or fluids could get pushed into your lungs by the pressure of the CPAP or BPAP. For home use, CPAP and BPAP machines can be rented or purchased through home health care companies. Many different brands of machines are available. Renting a machine before purchasing may help you find out which particular machine works well for you. Your health insurance company may also decide which machine you may get. Keep the CPAP or BPAP machine and attachments clean. Ask your health care provider for specific instructions. Check the humidifier if you have a dry stuffy nose or nosebleeds. Make sure it is working correctly. Follow these instructions at home: Take over-the-counter and prescription medicines only as told by your health care provider. Ask if you can take sinus medicine if your sinuses are blocked. Do not use any products that contain nicotine or tobacco. These products include cigarettes, chewing tobacco, and vaping devices, such as e-cigarettes. If you need help quitting, ask your health care provider. Keep all follow-up visits. This is important. Contact a health care provider if: You have redness or pressure sores on your head, face, mouth, or nose from the mask or head gear. You have trouble using the CPAP or BPAP machine. You cannot tolerate wearing the CPAP or BPAP mask. Someone tells you that you snore even when wearing your CPAP or BPAP. Get help right away if: You have trouble breathing. You feel confused. Summary CPAP and BPAP are methods that use air pressure to keep your airways open and to help you breathe well. If you have trouble with the mask, it is very important that you talk with your health care provider about finding a way to make the mask easier to tolerate. Do not stop using the  mask. There could be a negative impact to your health if you stop using the mask. Follow instructions from your health care provider about when to use the machine. This information is not intended to replace advice given to you by your health care provider. Make sure you discuss any questions you have with your health care provider. Document Revised: 06/03/2020 Document Reviewed: 06/03/2020 [employer] Patient Education  2022 [employer]  

## 2021-04-17 NOTE — Progress Notes (Signed)
[facility] [place], [place] [address]  Pulmonary Sleep Medicine   Office Visit Note  Patient Name: [name] DOB: [birth year] MRN [id number]    Chief Complaint: Obstructive Sleep Apnea visit  Brief History:  [name] is seen today for initial consult to establish care.  The patient has a 11 year history of sleep apnea. Patient is mostly using PAP nightly @ 10 - 20 cmH2O.  The patient feels good after sleeping with PAP.  The patient reports can't lay down and sleep without PAP. from PAP use. Reported sleepiness is  resolved and the Epworth Sleepiness Score is 11 out of 24. The patient does take daily naps especially after eating, sometimes fall asleep several hours a night without PAP. The patient complains of the following: not having been able to use several times this year due to being in the hospital.  She said one admission was due to mild CHF with sepsis.   The compliance download shows  compliance with an average use time of 5:21 hours@ 70%. The AHI is 1.0  The patient does not complain of limb movements disrupting sleep.  ROS  General: (-) fever, (-) chills, (-) night sweat Nose and Sinuses: (-) nasal stuffiness or itchiness, (-) postnasal drip, (-) nosebleeds, (-) sinus trouble. Mouth and Throat: (-) sore throat, (-) hoarseness. Neck: (-) swollen glands, (-) enlarged thyroid, (-) neck pain. Respiratory: - cough, - shortness of breath, - wheezing. Neurologic: - numbness, - tingling. Psychiatric: - anxiety, - depression   Current Medication: Outpatient Encounter Medications as of 04/17/2021  Medication Sig   acetaminophen (TYLENOL) 500 MG tablet Take 500-1,000 mg by mouth every 6 (six) hours as needed for mild pain or fever.   atenolol (TENORMIN) 50 MG tablet Take 50 mg by mouth daily.   cetirizine (ZYRTEC) 10 MG tablet Take 1 tablet (10 mg total) by mouth daily.   furosemide (LASIX) 20 MG tablet Take 1 tablet (20 mg total) by mouth daily.    methocarbamol (ROBAXIN) 500 MG tablet Take 1 tablet (500 mg total) by mouth every 8 (eight) hours as needed for muscle spasms.   No facility-administered encounter medications on file as of 04/17/2021.    Surgical History: Past Surgical History:  Procedure Laterality Date   COLONOSCOPY WITH PROPOFOL N/A 03/12/2018   Procedure: COLONOSCOPY WITH PROPOFOL;  Surgeon: Manya Silvas, MD;  Location: [facility] ENDOSCOPY;  Service: Endoscopy;  Laterality: N/A;   NO PAST SURGERIES      Medical History: Past Medical History:  Diagnosis Date   Arthritis    Asthma    Enlarged heart    Hypertension    Lymphedema    Sleep apnea    Spinal stenosis     Family History: Non contributory to the present illness  Social History: Social History   Socioeconomic History   Marital status: Single    Spouse name: Not on file   Number of children: Not on file   Years of education: Not on file   Highest education level: Not on file  Occupational History   Not on file  Tobacco Use   Smoking status: Former    Packs/day: 0.25    Types: Cigarettes    Start date: 07/09/1976    Quit date: 07/09/1990    Years since quitting: 30.7   Smokeless tobacco: Current    Types: Snuff  Vaping Use   Vaping Use: Never used  Substance and Sexual Activity   Alcohol use: No  Drug use: No   Sexual activity: Not on file  Other Topics Concern   Not on file  Social History Narrative   Not on file   Social Determinants of Health   Financial Resource Strain: Not on file  Food Insecurity: Not on file  Transportation Needs: Not on file  Physical Activity: Not on file  Stress: Not on file  Social Connections: Not on file  Intimate Partner Violence: Not on file    Vital Signs: Blood pressure (!) 151/83, pulse (!) 59, resp. rate 20, height 5\' 4"  (1.626 m), weight (!) 369 lb (167.4 kg), SpO2 96 %.  Examination: General Appearance: The patient is well-developed, well-nourished, and in no distress. Neck  Circumference: 46 cm Skin: Gross inspection of skin unremarkable. Head: normocephalic, no gross deformities. Eyes: no gross deformities noted. ENT: ears appear grossly normal Neurologic: Alert and oriented. No involuntary movements.    EPWORTH SLEEPINESS SCALE:  Scale:  (0)= no chance of dozing; (1)= slight chance of dozing; (2)= moderate chance of dozing; (3)= high chance of dozing  Chance  Situtation    Sitting and reading: 2    Watching TV: 2    Sitting Inactive in public: 0    As a passenger in car: 1      Lying down to rest: 2    Sitting and talking: 1    Sitting quielty after lunch: 3    In a car, stopped in traffic: 0   TOTAL SCORE:   11 out of 24    SLEEP STUDIES:  Split study  08/14/2009  -  AHI 45.6 Which includes  RERAs 77.2 , low SpO2 84%,  CPAP @ 16cmH2   CPAP COMPLIANCE DATA:  Date Range: 04/13/20 - 04/12/21  Average Daily Use: 4:28 hours  Median Use: 5:27 hours  Compliance for > 4 Hours: 70%  AHI: 1.0 respiratory events per hour  Days Used: 305/365  Mask Leak: 33.5 lpm  95th Percentile Pressure: 14.5 cmH2O         LABS: Recent Results (from the past 2160 hour(s))  Culture, blood (Routine x 2)     Status: None   Collection Time: 01/19/21 11:25 PM   Specimen: BLOOD  Result Value Ref Range   Specimen Description BLOOD RIGHT SHOULDER    Special Requests      BOTTLES DRAWN AEROBIC AND ANAEROBIC Blood Culture adequate volume   Culture      NO GROWTH 5 DAYS Performed at [facility], [address] [place], [place] [address]    Report Status 01/24/2021 FINAL   Resp Panel by RT-PCR (Flu A&B, Covid) Nasopharyngeal Swab     Status: None   Collection Time: 01/19/21 11:25 PM   Specimen: Nasopharyngeal Swab; Nasopharyngeal(NP) swabs in vial transport medium  Result Value Ref Range   SARS Coronavirus 2 by RT PCR NEGATIVE NEGATIVE    Comment: (NOTE) SARS-CoV-2 target nucleic acids are NOT DETECTED.  The SARS-CoV-2 RNA  is generally detectable in upper respiratory specimens during the acute phase of infection. The lowest concentration of SARS-CoV-2 viral copies this assay can detect is 138 copies/mL. A negative result does not preclude SARS-Cov-2 infection and should not be used as the sole basis for treatment or other patient management decisions. A negative result may occur with  improper specimen collection/handling, submission of specimen other than nasopharyngeal swab, presence of viral mutation(s) within the areas targeted by this assay, and inadequate number of viral copies(<138 copies/mL). A negative result must be combined  with clinical observations, patient history, and epidemiological information. The expected result is Negative.  Fact Sheet for Patients:  EntrepreneurPulse.com.au  Fact Sheet for Healthcare Providers:  IncredibleEmployment.be  This test is no t yet approved or cleared by the [place] FDA and  has been authorized for detection and/or diagnosis of SARS-CoV-2 by FDA under an Emergency Use Authorization (EUA). This EUA will remain  in effect (meaning this test can be used) for the duration of the COVID-19 declaration under Section 564(b)(1) of the Act, 21 U.S.C.section 360bbb-3(b)(1), unless the authorization is terminated  or revoked sooner.       Influenza A by PCR NEGATIVE NEGATIVE   Influenza B by PCR NEGATIVE NEGATIVE    Comment: (NOTE) The Xpert Xpress SARS-CoV-2/FLU/RSV plus assay is intended as an aid in the diagnosis of influenza from Nasopharyngeal swab specimens and should not be used as a sole basis for treatment. Nasal washings and aspirates are unacceptable for Xpert Xpress SARS-CoV-2/FLU/RSV testing.  Fact Sheet for Patients: EntrepreneurPulse.com.au  Fact Sheet for Healthcare Providers: IncredibleEmployment.be  This test is not yet approved or cleared by the [place] FDA  and has been authorized for detection and/or diagnosis of SARS-CoV-2 by FDA under an Emergency Use Authorization (EUA). This EUA will remain in effect (meaning this test can be used) for the duration of the COVID-19 declaration under Section 564(b)(1) of the Act, 21 U.S.C. section 360bbb-3(b)(1), unless the authorization is terminated or revoked.  Performed at [facility], [address] [place], [place] [address]   Culture, blood (Routine x 2)     Status: None   Collection Time: 01/19/21 11:26 PM   Specimen: BLOOD  Result Value Ref Range   Specimen Description BLOOD LEFT ANTECUBITAL    Special Requests      BOTTLES DRAWN AEROBIC ONLY Blood Culture results may not be optimal due to an inadequate volume of blood received in culture bottles   Culture      NO GROWTH 5 DAYS Performed at [facility], [address] [place], [place] [address]    Report Status 01/24/2021 FINAL   Comprehensive metabolic panel     Status: Abnormal   Collection Time: 01/19/21 11:42 PM  Result Value Ref Range   Sodium 136 135 - 145 mmol/L   Potassium 4.8 3.5 - 5.1 mmol/L   Chloride 101 98 - 111 mmol/L   CO2 29 22 - 32 mmol/L   Glucose, Bld 103 (H) 70 - 99 mg/dL    Comment: Glucose reference range applies only to samples taken after fasting for at least 8 hours.   BUN 25 (H) 8 - 23 mg/dL   Creatinine, Ser 1.31 (H) 0.44 - 1.00 mg/dL   Calcium 8.8 (L) 8.9 - 10.3 mg/dL   Total Protein 8.4 (H) 6.5 - 8.1 g/dL   Albumin 3.5 3.5 - 5.0 g/dL   AST 25 15 - 41 U/L   ALT 14 0 - 44 U/L   Alkaline Phosphatase 144 (H) 38 - 126 U/L   Total Bilirubin 1.0 0.3 - 1.2 mg/dL   GFR, Estimated 45 (L) >60 mL/min    Comment: (NOTE) Calculated using the CKD-EPI Creatinine Equation (2021)    Anion gap 6 5 - 15    Comment: Performed at [facility], [address] [place], [place] [address]  Lactic acid, plasma     Status: Abnormal   Collection Time: 01/19/21 11:42 PM  Result Value Ref  Range   Lactic Acid, Venous 2.1 (  HH) 0.5 - 1.9 mmol/L    Comment: CRITICAL RESULT CALLED TO, READ BACK BY AND VERIFIED WITH KELLY [email or web address]  01/20/21 RH Performed at [facility], [address] [place], [place] [address]   CBC with Differential     Status: Abnormal   Collection Time: 01/19/21 11:42 PM  Result Value Ref Range   WBC 14.6 (H) 4.0 - 10.5 K/uL   RBC 4.76 3.87 - 5.11 MIL/uL   Hemoglobin 12.6 12.0 - 15.0 g/dL   HCT 40.7 36.0 - 46.0 %   MCV 85.5 80.0 - 100.0 fL   MCH 26.5 26.0 - 34.0 pg   MCHC 31.0 30.0 - 36.0 g/dL   RDW 14.1 11.5 - 15.5 %   Platelets 147 (L) 150 - 400 K/uL   nRBC 0.0 0.0 - 0.2 %   Neutrophils Relative % 87 %   Neutro Abs 12.7 (H) 1.7 - 7.7 K/uL   Lymphocytes Relative 6 %   Lymphs Abs 0.8 0.7 - 4.0 K/uL   Monocytes Relative 6 %   Monocytes Absolute 0.9 0.1 - 1.0 K/uL   Eosinophils Relative 0 %   Eosinophils Absolute 0.0 0.0 - 0.5 K/uL   Basophils Relative 0 %   Basophils Absolute 0.0 0.0 - 0.1 K/uL   Immature Granulocytes 1 %   Abs Immature Granulocytes 0.16 (H) 0.00 - 0.07 K/uL    Comment: Performed at [facility], [address] [place], [place] [address]  Protime-INR     Status: None   Collection Time: 01/19/21 11:42 PM  Result Value Ref Range   Prothrombin Time 14.5 11.4 - 15.2 seconds   INR 1.1 0.8 - 1.2    Comment: (NOTE) INR goal varies based on device and disease states. Performed at [facility], [address] [place], [place] [address]   APTT     Status: None   Collection Time: 01/19/21 11:42 PM  Result Value Ref Range   aPTT 36 24 - 36 seconds    Comment: Performed at [facility], [place], [place] [address]  Procalcitonin - Baseline     Status: None   Collection Time: 01/19/21 11:42 PM  Result Value Ref Range   Procalcitonin 2.27 ng/mL    Comment:        Interpretation: PCT > 2 ng/mL: Systemic infection (sepsis) is likely, unless other causes are known. (NOTE)        Sepsis PCT Algorithm           Lower Respiratory Tract                                      Infection PCT Algorithm    ----------------------------     ----------------------------         PCT < 0.25 ng/mL                PCT < 0.10 ng/mL          Strongly encourage             Strongly discourage   discontinuation of antibiotics    initiation of antibiotics    ----------------------------     -----------------------------       PCT 0.25 - 0.50 ng/mL            PCT 0.10 - 0.25 ng/mL  OR       >80% decrease in PCT            Discourage initiation of                                            antibiotics      Encourage discontinuation           of antibiotics    ----------------------------     -----------------------------         PCT >= 0.50 ng/mL              PCT 0.26 - 0.50 ng/mL               AND       <80% decrease in PCT              Encourage initiation of                                             antibiotics       Encourage continuation           of antibiotics    ----------------------------     -----------------------------        PCT >= 0.50 ng/mL                  PCT > 0.50 ng/mL               AND         increase in PCT                  Strongly encourage                                      initiation of antibiotics    Strongly encourage escalation           of antibiotics                                     -----------------------------                                           PCT <= 0.25 ng/mL                                                 OR                                        > 80% decrease in PCT                                      Discontinue / Do not initiate  antibiotics  Performed at [facility], [address] [place], [place] [address]   Urinalysis, Complete w Microscopic Urine, Clean Catch     Status: Abnormal   Collection Time: 01/20/21 12:50 AM  Result Value Ref Range    Color, Urine STRAW (A) YELLOW   APPearance CLEAR (A) CLEAR   Specific Gravity, Urine 1.013 1.005 - 1.030   pH 7.0 5.0 - 8.0   Glucose, UA NEGATIVE NEGATIVE mg/dL   Hgb urine dipstick NEGATIVE NEGATIVE   Bilirubin Urine NEGATIVE NEGATIVE   Ketones, ur NEGATIVE NEGATIVE mg/dL   Protein, ur NEGATIVE NEGATIVE mg/dL   Nitrite NEGATIVE NEGATIVE   Leukocytes,Ua NEGATIVE NEGATIVE   WBC, UA 0-5 0 - 5 WBC/hpf   Bacteria, UA NONE SEEN NONE SEEN   Squamous Epithelial / LPF 0-5 0 - 5   Mucus PRESENT     Comment: Performed at [facility], [address] [place], [place] [address]  Urine Culture     Status: Abnormal   Collection Time: 01/20/21 12:50 AM   Specimen: Urine, Random  Result Value Ref Range   Specimen Description      URINE, RANDOM Performed at [name], [address], [place], [place] [address]    Special Requests      NONE Performed at [facility], [address] [place], [place] [address]    Culture (A)     <10,000 COLONIES/mL INSIGNIFICANT GROWTH Performed at [place] [address] [place], [place] [address]    Report Status 01/21/2021 FINAL   Lactic acid, plasma     Status: None   Collection Time: 01/20/21  1:15 AM  Result Value Ref Range   Lactic Acid, Venous 1.0 0.5 - 1.9 mmol/L    Comment: Performed at [facility], [address] [place], [place] [address]  Protime-INR     Status: Abnormal   Collection Time: 01/20/21  5:23 AM  Result Value Ref Range   Prothrombin Time 15.4 (H) 11.4 - 15.2 seconds   INR 1.2 0.8 - 1.2    Comment: (NOTE) INR goal varies based on device and disease states. Performed at [facility], [address] [place], [place] [address]   Basic metabolic panel     Status: Abnormal   Collection Time: 01/20/21  5:23 AM  Result Value Ref Range   Sodium 135 135 - 145 mmol/L   Potassium 4.0 3.5 - 5.1 mmol/L   Chloride 104 98 - 111 mmol/L   CO2 22 22 - 32 mmol/L   Glucose,  Bld 93 70 - 99 mg/dL    Comment: Glucose reference range applies only to samples taken after fasting for at least 8 hours.   BUN 21 8 - 23 mg/dL   Creatinine, Ser 1.14 (H) 0.44 - 1.00 mg/dL   Calcium 8.4 (L) 8.9 - 10.3 mg/dL   GFR, Estimated 53 (L) >60 mL/min    Comment: (NOTE) Calculated using the CKD-EPI Creatinine Equation (2021)    Anion gap 9 5 - 15    Comment: Performed at [facility], [address] [place], [place] [address]  Cortisol-am, blood     Status: None   Collection Time: 01/20/21  5:47 AM  Result Value Ref Range   Cortisol - AM 9.9 6.7 - 22.6 ug/dL    Comment: Performed at [place] [name] [place], [place] [address]  CBC     Status: Abnormal   Collection Time:  01/20/21  5:47 AM  Result Value Ref Range   WBC 14.8 (H) 4.0 - 10.5 K/uL   RBC 4.64 3.87 - 5.11 MIL/uL   Hemoglobin 12.3 12.0 - 15.0 g/dL   HCT 38.9 36.0 - 46.0 %   MCV 83.8 80.0 - 100.0 fL   MCH 26.5 26.0 - 34.0 pg   MCHC 31.6 30.0 - 36.0 g/dL   RDW 14.3 11.5 - 15.5 %   Platelets 143 (L) 150 - 400 K/uL   nRBC 0.0 0.0 - 0.2 %    Comment: Performed at [facility], [address] [place], [place] [address]  Procalcitonin     Status: None   Collection Time: 01/20/21  5:47 AM  Result Value Ref Range   Procalcitonin 5.02 ng/mL    Comment:        Interpretation: PCT > 2 ng/mL: Systemic infection (sepsis) is likely, unless other causes are known. (NOTE)       Sepsis PCT Algorithm           Lower Respiratory Tract                                      Infection PCT Algorithm    ----------------------------     ----------------------------         PCT < 0.25 ng/mL                PCT < 0.10 ng/mL          Strongly encourage             Strongly discourage   discontinuation of antibiotics    initiation of antibiotics    ----------------------------     -----------------------------       PCT 0.25 - 0.50 ng/mL            PCT 0.10 - 0.25 ng/mL               OR        >80% decrease in PCT            Discourage initiation of                                            antibiotics      Encourage discontinuation           of antibiotics    ----------------------------     -----------------------------         PCT >= 0.50 ng/mL              PCT 0.26 - 0.50 ng/mL               AND       <80% decrease in PCT              Encourage initiation of                                             antibiotics       Encourage continuation           of antibiotics    ----------------------------     -----------------------------        PCT >=  0.50 ng/mL                  PCT > 0.50 ng/mL               AND         increase in PCT                  Strongly encourage                                      initiation of antibiotics    Strongly encourage escalation           of antibiotics                                     -----------------------------                                           PCT <= 0.25 ng/mL                                                 OR                                        > 80% decrease in PCT                                      Discontinue / Do not initiate                                             antibiotics  Performed at [facility], [address], [place], [place] [address]   Basic metabolic panel     Status: Abnormal   Collection Time: 01/21/21  5:38 AM  Result Value Ref Range   Sodium 138 135 - 145 mmol/L   Potassium 3.8 3.5 - 5.1 mmol/L   Chloride 106 98 - 111 mmol/L   CO2 26 22 - 32 mmol/L   Glucose, Bld 105 (H) 70 - 99 mg/dL    Comment: Glucose reference range applies only to samples taken after fasting for at least 8 hours.   BUN 18 8 - 23 mg/dL   Creatinine, Ser 0.83 0.44 - 1.00 mg/dL   Calcium 8.2 (L) 8.9 - 10.3 mg/dL   GFR, Estimated >60 >60 mL/min    Comment: (NOTE) Calculated using the CKD-EPI Creatinine Equation (2021)    Anion gap 6 5 - 15    Comment: Performed at [facility], [address] [place], [place] [address]  CBC     Status: Abnormal   Collection Time: 01/21/21  5:38 AM  Result Value Ref Range   WBC 6.4 4.0 - 10.5 K/uL   RBC 3.96 3.87 - 5.11 MIL/uL   Hemoglobin 10.7 (L)  12.0 - 15.0 g/dL   HCT 34.1 (L) 36.0 - 46.0 %   MCV 86.1 80.0 - 100.0 fL   MCH 27.0 26.0 - 34.0 pg   MCHC 31.4 30.0 - 36.0 g/dL   RDW 14.4 11.5 - 15.5 %   Platelets 123 (L) 150 - 400 K/uL    Comment: Immature Platelet Fraction may be clinically indicated, consider ordering this additional test [id number]    nRBC 0.0 0.0 - 0.2 %    Comment: Performed at [facility], [address], [place], [place] [address]  Magnesium     Status: None   Collection Time: 01/21/21  5:38 AM  Result Value Ref Range   Magnesium 1.9 1.7 - 2.4 mg/dL    Comment: Performed at [facility], [address] [place], [place] [address]    Radiology: [facility] Chest [place] 1 View  Result Date: 01/20/2021 CLINICAL DATA:  65 year old female with sepsis. EXAM: PORTABLE CHEST 1 VIEW COMPARISON:  Chest radiograph dated 12/23/2020. FINDINGS: There is cardiomegaly with vascular congestion and probable mild edema. No focal consolidation, pleural effusion, or pneumothorax. No acute osseous pathology. IMPRESSION: Cardiomegaly with findings of CHF. Electronically Signed   By: Anner Crete M.D.   On: 01/20/2021 00:20    No results found.  No results found.    Assessment and Plan: Patient Active Problem List   Diagnosis Date Noted   OSA on CPAP 04/17/2021   CPAP use counseling 04/17/2021   Sepsis due to cellulitis ([place]) 01/20/2021   DNR (do not resuscitate)    Lactic acidosis    AKI (acute kidney injury) ([place])    Bilateral lower leg cellulitis 04/15/2020   Anemia    Cellulitis of left lower extremity 11/03/2018   Sepsis ([place]) 04/17/2018   Chest pain with high risk for cardiac etiology 02/04/2017   Heart palpitations 02/04/2017   SOB (shortness of breath) on exertion 02/04/2017   OSA (obstructive  sleep apnea) 08/04/2016   HTN (hypertension) 08/04/2016   Asthma 08/04/2016   Screening for colon cancer 05/17/2015   Cellulitis 01/24/2015   Cellulitis of right leg 01/23/2015   Skin rash 12/28/2013   Tobacco use 12/28/2013   Spinal stenosis 07/11/2011   Cataracts, bilateral 05/30/2010   History of colonic polyps 12/28/2009   Lymphedema 02/10/2009   Idiopathic urticaria 02/10/2009   Morbid obesity with BMI of 60.0-69.9, adult ([place]) 02/10/2009   Other abnormal glucose 02/10/2009   Essential hypertension 02/10/2009   1. OSA on CPAP The patient does tolerate PAP and reports  benefit from PAP use. The patient was reminded how to clean equipment and advised to replace supplies routinely. The patient was also counselled on weight loss. The compliance is fair. The AHI is 1.0.   OSA- increase use of cpap by using with her naps, which she has not been. F/u one year.   2. CPAP use counseling CPAP Counseling: had a lengthy discussion with the patient regarding the importance of PAP therapy in management of the sleep apnea. Patient appears to understand the risk factor reduction and also understands the risks associated with untreated sleep apnea. Patient will try to make a good faith effort to remain compliant with therapy. Also instructed the patient on proper cleaning of the device including the water must be changed daily if possible and use of distilled water is preferred. Patient understands that the machine should be regularly cleaned with appropriate recommended cleaning solutions that do not damage the PAP machine for example given white vinegar  and water rinses. Other methods such as ozone treatment may not be as good as these simple methods to achieve cleaning.   3. Morbid obesity with BMI of 60.0-69.9, adult ([place]) Obesity Counseling: Had a lengthy discussion regarding patients BMI and weight issues. Patient was instructed on portion control as well as increased activity. Also discussed  caloric restrictions with trying to maintain intake less than 2000 Kcal. Discussions were made in accordance with the 5As of weight management. Simple actions such as not eating late and if able to, taking a walk is suggested.   4. Primary hypertension Hypertension Counseling:   The following hypertensive lifestyle modification were recommended and discussed:  1. Limiting alcohol intake to less than 1 oz/day of ethanol:(24 oz of beer or 8 oz of wine or 2 oz of 100-proof whiskey). 2. Take baby ASA 81 mg daily. 3. Importance of regular aerobic exercise and losing weight. 4. Reduce dietary saturated fat and cholesterol intake for overall cardiovascular health. 5. Maintaining adequate dietary potassium, calcium, and magnesium intake. 6. Regular monitoring of the blood pressure. 7. Reduce sodium intake to less than 100 mmol/day (less than 2.3 gm of sodium or less than 6 gm of sodium choride)      General Counseling: I have discussed the findings of the evaluation and examination with [name].  I have also discussed any further diagnostic evaluation thatmay be needed or ordered today. Wilmetta verbalizes understanding of the findings of todays visit. We also reviewed her medications today and discussed drug interactions and side effects including but not limited excessive drowsiness and altered mental states. We also discussed that there is always a risk not just to her but also people around her. she has been encouraged to call the office with any questions or concerns that should arise related to todays visit.  No orders of the defined types were placed in this encounter.       I have personally obtained a history, examined the patient, evaluated laboratory and imaging results, formulated the assessment and plan and placed orders.   This patient was seen today by Tressie Ellis, PA-C in collaboration with Dr. Devona Konig.  Allyne Gee, MD [facility] Diplomate [facility] Pulmonary and Critical Care  Medicine Sleep medicine

## 2021-08-01 ENCOUNTER — Other Ambulatory Visit: Payer: Self-pay

## 2021-08-01 ENCOUNTER — Encounter: Payer: Medicare HMO | Attending: Physician Assistant | Admitting: Physician Assistant

## 2021-08-01 DIAGNOSIS — E11621 Type 2 diabetes mellitus with foot ulcer: Secondary | ICD-10-CM | POA: Diagnosis present

## 2021-08-01 DIAGNOSIS — L97822 Non-pressure chronic ulcer of other part of left lower leg with fat layer exposed: Secondary | ICD-10-CM | POA: Diagnosis not present

## 2021-08-01 DIAGNOSIS — Q820 Hereditary lymphedema: Secondary | ICD-10-CM | POA: Diagnosis not present

## 2021-08-01 DIAGNOSIS — L97812 Non-pressure chronic ulcer of other part of right lower leg with fat layer exposed: Secondary | ICD-10-CM | POA: Diagnosis not present

## 2021-08-01 NOTE — Progress Notes (Signed)
[name], [name] ([id number]) Visit Report for 08/01/2021 Abuse Risk Screen Details Patient Name: [name], [name]. Date of Service: 08/01/2021 12:45 PM Medical Record Number: [id number] Patient Account Number: [id number] Date of Birth/Sex: 1955-11-23 (66 y.o. F) Treating RN: [name] Dredge Primary Care Jazlen Ogarro: Ranae Plumber Other Clinician: Referring Anaston Koehn: Ranae Plumber Treating Squire Withey/Extender: Skipper Cliche in Treatment: 0 Abuse Risk Screen Items Answer ABUSE RISK SCREEN: Has anyone close to you tried to hurt or harm you recentlyo No Do you feel uncomfortable with anyone in your familyo No Has anyone forced you do things that you didnot want to doo No Electronic Signature(s) Signed: 08/01/2021 4:34:33 PM By: [name] Dredge Entered By: [name] Dredge on 08/01/2021 13:15:32 Vetter, [name] ([id number]) -------------------------------------------------------------------------------- Activities of Daily Living Details Patient Name: [name], [name]. Date of Service: 08/01/2021 12:45 PM Medical Record Number: [id number] Patient Account Number: [id number] Date of Birth/Sex: 11-26-1955 (66 y.o. F) Treating RN: [name] Dredge Primary Care Manveer Gomes: Ranae Plumber Other Clinician: Referring Bonne Whack: Ranae Plumber Treating [place] Lasseter/Extender: Skipper Cliche in Treatment: 0 Activities of Daily Living Items Answer Activities of Daily Living (Please select one for each item) Drive Automobile Not Able Take Medications Completely Able Use Telephone Completely Able Care for Appearance Completely Able Use Toilet Completely Able Bath / Shower Completely Able Dress Self Completely Able Feed Self Completely Able Walk Need Assistance Get In / Out Bed Completely Able Housework Completely Able Prepare Meals Completely [place] for Self Completely Able Electronic Signature(s) Signed: 08/01/2021 4:34:33 PM By: [name] Dredge Entered By:  [name] Dredge on 08/01/2021 13:16:10 Forbis, [name] ([id number]) -------------------------------------------------------------------------------- Education Screening Details Patient Name: [name]. Date of Service: 08/01/2021 12:45 PM Medical Record Number: [id number] Patient Account Number: [id number] Date of Birth/Sex: 07-29-1955 (66 y.o. F) Treating RN: [name] Dredge Primary Care Desiraye Rolfson: Ranae Plumber Other Clinician: Referring Harjit Leider: Ranae Plumber Treating Brenn Deziel/Extender: Skipper Cliche in Treatment: 0 Learning Preferences/Education Level/Primary Language Learning Preference: Explanation, Demonstration, Video, Communication Board, Printed Material Highest Education Level: College or Above Preferred Language: English Cognitive Barrier Language Barrier: No Translator Needed: No Memory Deficit: No Emotional Barrier: No Cultural/Religious Beliefs Affecting Medical Care: No Physical Barrier Impaired Vision: Yes Glasses Impaired Hearing: No Decreased Hand dexterity: No Knowledge/Comprehension Knowledge Level: High Comprehension Level: High Ability to understand written instructions: High Ability to understand verbal instructions: High Motivation Anxiety Level: Calm Cooperation: Cooperative Education Importance: Acknowledges Need Interest in Health Problems: Asks Questions Perception: Coherent Willingness to Engage in Self-Management High Activities: Readiness to Engage in Self-Management High Activities: Electronic Signature(s) Signed: 08/01/2021 4:34:33 PM By: [name] Dredge Entered By: [name] Dredge on 08/01/2021 13:16:44 Battiste, [name] ([id number]) -------------------------------------------------------------------------------- Fall Risk Assessment Details Patient Name: [name]. Date of Service: 08/01/2021 12:45 PM Medical Record Number: [id number] Patient Account Number: [id number] Date of Birth/Sex: 06-06-1956 (66 y.o.  F) Treating RN: [name] Dredge Primary Care Ailin Rochford: Ranae Plumber Other Clinician: Referring Kord Monette: Ranae Plumber Treating Selena Swaminathan/Extender: Skipper Cliche in Treatment: 0 Fall Risk Assessment Items Have you had 2 or more falls in the last 12 monthso 0 No Have you had any fall that resulted in injury in the last 12 monthso 0 No FALLS RISK SCREEN History of falling - immediate or within 3 months 0 No Secondary diagnosis (Do you have 2 or more medical diagnoseso) 0 No Ambulatory aid None/bed rest/wheelchair/nurse 0 Yes Crutches/cane/walker 0 No Furniture 0 No Intravenous therapy Access/Saline/Heparin Lock 0 No Gait/Transferring Normal/ bed  rest/ wheelchair 0 Yes Weak (short steps with or without shuffle, stooped but able to lift head while walking, may 0 No seek support from furniture) Impaired (short steps with shuffle, may have difficulty arising from chair, head down, impaired 0 No balance) Mental Status Oriented to own ability 0 Yes Electronic Signature(s) Signed: 08/01/2021 4:34:33 PM By: [name] Dredge Entered By: [name] Dredge on 08/01/2021 13:16:59 Lovejoy, [name] ([id number]) -------------------------------------------------------------------------------- Foot Assessment Details Patient Name: Elizabeth Landau C. Date of Service: 08/01/2021 12:45 PM Medical Record Number: [id number] Patient Account Number: [id number] Date of Birth/Sex: 03-10-1956 (66 y.o. F) Treating RN: [name] Dredge Primary Care Clarie Camey: Ranae Plumber Other Clinician: Referring Matty Deamer: Ranae Plumber Treating Serah Nicoletti/Extender: Skipper Cliche in Treatment: 0 Foot Assessment Items Site Locations + = Sensation present, - = Sensation absent, C = Callus, U = Ulcer R = Redness, W = Warmth, M = Maceration, PU = Pre-ulcerative lesion F = Fissure, S = Swelling, D = Dryness Assessment Right: Left: Other Deformity: No No Prior Foot Ulcer: No No Prior Amputation: No No Charcot  Joint: No No Ambulatory Status: Ambulatory With Help Assistance Device: Cane Gait: Steady Electronic Signature(s) Signed: 08/01/2021 4:34:33 PM By: [name] Dredge Entered By: [name] Dredge on 08/01/2021 13:30:06 Croswell, [name] ([id number]) -------------------------------------------------------------------------------- Nutrition Risk Screening Details Patient Name: Elizabeth Landau C. Date of Service: 08/01/2021 12:45 PM Medical Record Number: [id number] Patient Account Number: [id number] Date of Birth/Sex: 01-25-1956 (66 y.o. F) Treating RN: [name] Dredge Primary Care Skyeler Smola: Ranae Plumber Other Clinician: Referring Merril Isakson: Ranae Plumber Treating Lilianah Buffin/Extender: Skipper Cliche in Treatment: 0 Height (in): 63 Weight (lbs): 372 Body Mass Index (BMI): 65.9 Nutrition Risk Screening Items Score Screening NUTRITION RISK SCREEN: I have an illness or condition that made me change the kind and/or amount of food I eat 0 No I eat fewer than two meals per day 0 No I eat few fruits and vegetables, or milk products 0 No I have three or more drinks of beer, liquor or wine almost every day 0 No I have tooth or mouth problems that make it hard for me to eat 0 No I don't always have enough money to buy the food I need 0 No I eat alone most of the time 0 No I take three or more different prescribed or over-the-counter drugs a day 0 No Without wanting to, I have lost or gained 10 pounds in the last six months 0 No I am not always physically able to shop, cook and/or feed myself 0 No Nutrition Protocols Good Risk Protocol 0 No interventions needed Moderate Risk Protocol High Risk Proctocol Risk Level: Good Risk Score: 0 Electronic Signature(s) Signed: 08/01/2021 4:34:33 PM By: [name] Dredge Entered By: [name] Dredge on 08/01/2021 13:17:13

## 2021-08-01 NOTE — Progress Notes (Signed)
[name], [name] ([id number]) Visit Report for 08/01/2021 Allergy List Details Patient Name: [name], [name]. Date of Service: 08/01/2021 12:45 PM Medical Record Number: [id number] Patient Account Number: [id number] Date of Birth/Sex: 04-13-1956 (66 y.o. F) Treating RN: [name] Dredge Primary Care Margareth Kanner: Ranae Plumber Other Clinician: Referring Onofrio Klemp: Ranae Plumber Treating Keyasha Miah/Extender: Jeri Cos Weeks in Treatment: 0 Allergies Active Allergies ibuprofen Reaction: hives, swells ACE Inhibitors vancomycin Reaction: itching, nausea vomiting Allergy Notes Electronic Signature(s) Signed: 08/01/2021 4:34:33 PM By: [name] Dredge Entered By: [name] Dredge on 08/01/2021 13:08:59 Ems, [name] ([id number]) -------------------------------------------------------------------------------- Arrival Information Details Patient Name: [name]. Date of Service: 08/01/2021 12:45 PM Medical Record Number: [id number] Patient Account Number: [id number] Date of Birth/Sex: 1956/04/29 (66 y.o. F) Treating RN: [name] Dredge Primary Care Rubye Strohmeyer: Ranae Plumber Other Clinician: Referring Ana Liaw: Ranae Plumber Treating Osker Ayoub/Extender: Skipper Cliche in Treatment: 0 Visit Information Patient Arrived: [name] Arrival Time: 13:02 Accompanied By: self Transfer Assistance: None Patient Identification Verified: Yes Secondary Verification Process Completed: Yes History Since Last Visit Added or deleted any medications: Yes Any new allergies or adverse reactions: No Had a fall or experienced change in activities of daily living that may affect risk of falls: No Hospitalized since last visit: No Has Dressing in Place as Prescribed: Yes Pain Present Now: No Electronic Signature(s) Signed: 08/01/2021 4:34:33 PM By: [name] Dredge Entered By: [name] Dredge on 08/01/2021 13:03:59 Kassem, [name]  ([id number]) -------------------------------------------------------------------------------- Clinic Level of Care Assessment Details Patient Name: [name]. Date of Service: 08/01/2021 12:45 PM Medical Record Number: [id number] Patient Account Number: [id number] Date of Birth/Sex: 10-20-1955 (66 y.o. F) Treating RN: [name] Dredge Primary Care Oriah Leinweber: Ranae Plumber Other Clinician: Referring Shuna Tabor: Ranae Plumber Treating Gaby Harney/Extender: Skipper Cliche in Treatment: 0 Clinic Level of Care Assessment Items TOOL 1 Quantity Score X - Use when EandM and Procedure is performed on INITIAL visit 1 0 ASSESSMENTS - Nursing Assessment / Reassessment []  - General Physical Exam (combine w/ comprehensive assessment (listed just below) when performed on new 0 pt. evals) X- 1 25 Comprehensive Assessment (HX, ROS, Risk Assessments, Wounds Hx, etc.) ASSESSMENTS - Wound and Skin Assessment / Reassessment []  - Dermatologic / Skin Assessment (not related to wound area) 0 ASSESSMENTS - Ostomy and/or Continence Assessment and Care []  - Incontinence Assessment and Management 0 []  - 0 Ostomy Care Assessment and Management (repouching, etc.) PROCESS - Coordination of Care X - Simple Patient / Family Education for ongoing care 1 15 []  - 0 Complex (extensive) Patient / Family Education for ongoing care []  - 0 Staff obtains Programmer, systems, Records, Test Results / Process Orders []  - 0 Staff telephones HHA, Nursing Homes / Clarify orders / etc []  - 0 Routine Transfer to another Facility (non-emergent condition) []  - 0 Routine Hospital Admission (non-emergent condition) []  - 0 New Admissions / Biomedical engineer / Ordering NPWT, Apligraf, etc. []  - 0 Emergency Hospital Admission (emergent condition) PROCESS - Special Needs []  - Pediatric / Minor Patient Management 0 []  - 0 Isolation Patient Management []  - 0 Hearing / Language / Visual special needs []  - 0 Assessment of  Community assistance (transportation, D/C planning, etc.) []  - 0 Additional assistance / Altered mentation []  - 0 Support Surface(s) Assessment (bed, cushion, seat, etc.) INTERVENTIONS - Miscellaneous []  - External ear exam 0 []  - 0 Patient Transfer (multiple staff / Civil Service fast streamer / Similar devices) []  - 0 Simple Staple / Suture removal (25 or less) []  - 0 Complex  Staple / Suture removal (26 or more) []  - 0 Hypo/Hyperglycemic Management (do not check if billed separately) []  - 0 Ankle / Brachial Index (ABI) - do not check if billed separately Has the patient been seen at the hospital within the last three years: Yes Total Score: 40 Level Of Care: New/Established - Level 2 DEMETRIS, MEINHARDT ([id number]) Electronic Signature(s) Signed: 08/01/2021 4:34:33 PM By: [name] Dredge Entered By: [name] Dredge on 08/01/2021 15:32:19 Rebello, [name] ([id number]) -------------------------------------------------------------------------------- Encounter Discharge Information Details Patient Name: [name]. Date of Service: 08/01/2021 12:45 PM Medical Record Number: [id number] Patient Account Number: [id number] Date of Birth/Sex: 1956/03/13 (66 y.o. F) Treating RN: [name] Dredge Primary Care Evy Lutterman: Ranae Plumber Other Clinician: Referring Crysta Gulick: Ranae Plumber Treating Kandiss Ihrig/Extender: Skipper Cliche in Treatment: 0 Encounter Discharge Information Items Post Procedure Vitals Discharge Condition: Stable Temperature (F): 97.9 Ambulatory Status: Cane Pulse (bpm): 71 Discharge Destination: Home Respiratory Rate (breaths/min): 18 Transportation: Other Blood Pressure (mmHg): 144/87 Accompanied By: self Schedule Follow-up Appointment: Yes Clinical Summary of Care: Electronic Signature(s) Signed: 08/01/2021 3:34:19 PM By: [name] Dredge Entered By: [name] Dredge on 08/01/2021 15:34:18 Gosling, [name]  ([id number]) -------------------------------------------------------------------------------- Lower Extremity Assessment Details Patient Name: [name]. Date of Service: 08/01/2021 12:45 PM Medical Record Number: [id number] Patient Account Number: [id number] Date of Birth/Sex: 07-12-1955 (66 y.o. F) Treating RN: [name] Dredge Primary Care Carrera Kiesel: Ranae Plumber Other Clinician: Referring Lancer Thurner: Ranae Plumber Treating Deedra Pro/Extender: Skipper Cliche in Treatment: 0 Edema Assessment Assessed: [Left: No] [Right: No] Edema: [Left: Yes] [Right: Yes] Calf Left: Right: Point of Measurement: 39 cm From Medial Instep 89 cm 93 cm Ankle Left: Right: Point of Measurement: 9 cm From Medial Instep 52.5 cm 53.5 cm Vascular Assessment Pulses: Dorsalis Pedis Palpable: [Left:Yes] [Right:Yes] Notes unable to perform ABI due to size of bilat LE Electronic Signature(s) Signed: 08/01/2021 4:34:33 PM By: [name] Dredge Entered By: [name] Dredge on 08/01/2021 13:33:04 Svec, [name] ([id number]) -------------------------------------------------------------------------------- Multi Wound Chart Details Patient Name: [name]. Date of Service: 08/01/2021 12:45 PM Medical Record Number: [id number] Patient Account Number: [id number] Date of Birth/Sex: 08/08/1955 (66 y.o. F) Treating RN: [name] Dredge Primary Care Kailoni Vahle: Ranae Plumber Other Clinician: Referring Laci Frenkel: Ranae Plumber Treating Zahid Carneiro/Extender: Skipper Cliche in Treatment: 0 Vital Signs Height(in): 5 Pulse(bpm): 69 Weight(lbs): 419 Blood Pressure(mmHg): 144/87 Body Mass Index(BMI): 65.9 Temperature(F): 97.9 Respiratory Rate(breaths/min): 18 Photos: [N/A:N/A] Wound Location: Left, Lateral Lower Leg N/A N/A Wounding Event: Gradually Appeared N/A N/A Primary Etiology: Lymphedema N/A N/A Comorbid History: Cataracts, Chronic sinus N/A N/A problems/congestion, Anemia, Lymphedema,  Asthma, Sleep Apnea, Tuberculosis, Angina, Hypertension, Gout, Osteoarthritis Date Acquired: 07/18/2021 N/A N/A Weeks of Treatment: 0 N/A N/A Wound Status: Open N/A N/A Wound Recurrence: No N/A N/A Clustered Wound: Yes N/A N/A Clustered Quantity: 2 N/A N/A Measurements L x W x D (cm) 3x13x0.1 N/A N/A Area (cm) : 30.631 N/A N/A Volume (cm) : 3.063 N/A N/A Classification: Partial Thickness N/A N/A Exudate Amount: Medium N/A N/A Exudate Type: Serosanguineous N/A N/A Exudate Color: red, brown N/A N/A Granulation Amount: Medium (34-66%) N/A N/A Granulation Quality: Pink N/A N/A Necrotic Amount: None Present (0%) N/A N/A Exposed Structures: Fat Layer (Subcutaneous Tissue): N/A N/A Yes Epithelialization: None N/A N/A Treatment Notes Electronic Signature(s) Signed: 08/01/2021 4:34:33 PM By: [name] Dredge Entered By: [name] Dredge on 08/01/2021 13:55:44 Geister, [name] ([id number]) -------------------------------------------------------------------------------- Multi-Disciplinary Care Plan Details Patient Name: [name]. Date of Service: 08/01/2021 12:45 PM Medical Record Number:  [id number] Patient Account Number: [id number] Date of Birth/Sex: [birth year] (66 y.o. F) Treating RN: [name] Dredge Primary Care Latiya Navia: Ranae Plumber Other Clinician: Referring Maycol Hoying: Ranae Plumber Treating Jesenya Bowditch/Extender: Skipper Cliche in Treatment: 0 Active Inactive Orientation to the Wound Care Program Nursing Diagnoses: Knowledge deficit related to the wound healing center program Goals: Patient/caregiver will verbalize understanding of the [place] [facility] Program Date Initiated: 08/01/2021 Target Resolution Date: 08/01/2021 Goal Status: Active Interventions: Provide education on orientation to the wound center Notes: Wound/Skin Impairment Nursing Diagnoses: Impaired tissue integrity Knowledge deficit related to ulceration/compromised skin  integrity Goals: Ulcer/skin breakdown will have a volume reduction of 30% by week 4 Date Initiated: 08/01/2021 Target Resolution Date: 08/29/2021 Goal Status: Active Ulcer/skin breakdown will have a volume reduction of 50% by week 8 Date Initiated: 08/01/2021 Target Resolution Date: 09/26/2021 Goal Status: Active Ulcer/skin breakdown will have a volume reduction of 80% by week 12 Date Initiated: 08/01/2021 Target Resolution Date: 10/24/2021 Goal Status: Active Ulcer/skin breakdown will heal within 14 weeks Date Initiated: 08/01/2021 Target Resolution Date: 11/07/2021 Goal Status: Active Interventions: Assess patient/caregiver ability to obtain necessary supplies Assess patient/caregiver ability to perform ulcer/skin care regimen upon admission and as needed Assess ulceration(s) every visit Provide education on ulcer and skin care Notes: Electronic Signature(s) Signed: 08/01/2021 4:34:33 PM By: [name] Dredge Entered By: [name] Dredge on 08/01/2021 13:54:59 [place]en[place]gen, [name] ([id number]) -------------------------------------------------------------------------------- Non-Wound Condition Assessment Details Patient Name: [name]. Date of Service: 08/01/2021 12:45 PM Medical Record Number: [id number] Patient Account Number: [id number] Date of Birth/Sex: [birth year] (66 y.o. F) Treating RN: [name] Dredge Primary Care Sunjai Levandoski: Ranae Plumber Other Clinician: Referring Roni Friberg: Ranae Plumber Treating Kalila Adkison/Extender: Skipper Cliche in Treatment: 0 Non-Wound Condition: Condition: Lymphedema Location: Leg Side: Right Notes: Right leg with decreased drainage this visit. Photos Notes moderate amount of drainage noted, no open area noted upon assessment Electronic Signature(s) Signed: 08/01/2021 4:34:33 PM By: [name] Dredge Entered By: [name] Dredge on 08/01/2021 13:52:57 Brabson, [name]  ([id number]) -------------------------------------------------------------------------------- Pain Assessment Details Patient Name: Elizabeth Landau C. Date of Service: 08/01/2021 12:45 PM Medical Record Number: [id number] Patient Account Number: [id number] Date of Birth/Sex: Jun 22, 1956 (65 y.o. F) Treating RN: [name] Dredge Primary Care Pristine Gladhill: Ranae Plumber Other Clinician: Referring Romy Ipock: Ranae Plumber Treating Lamount Bankson/Extender: Skipper Cliche in Treatment: 0 Active Problems Location of Pain Severity and Description of Pain Patient Has Paino No Site Locations Rate the pain. Current Pain Level: 0 Pain Management and Medication Current Pain Management: Electronic Signature(s) Signed: 08/01/2021 4:34:33 PM By: [name] Dredge Entered By: [name] Dredge on 08/01/2021 13:04:09 Aydelott, [name] -------------------------------------------------------------------------------- Patient/Caregiver Education Details Patient Name: [name]. Date of Service: 08/01/2021 12:45 PM Medical Record Number: [id number] Patient Account Number: [id number] Date of Birth/Gender: [birth year] (66 y.o. F) Treating RN: [name] Dredge Primary Care Physician: Ranae Plumber Other Clinician: Referring Physician: Ranae Plumber Treating Physician/Extender: Skipper Cliche in Treatment: 0 Education Assessment Education Provided To: Patient Education Topics Provided Welcome To [facility]: Handouts: Welcome To [facility] Methods: Explain/Verbal Responses: State content correctly Wound/Skin Impairment: Handouts: Caring for Your Ulcer Methods: Explain/Verbal Responses: State content correctly Electronic Signature(s) Signed: 08/01/2021 4:34:33 PM By: [name] Dredge Entered By: [name] Dredge on 08/01/2021 15:32:59 Kalisz, [name] ([id number]) -------------------------------------------------------------------------------- Wound Assessment  Details Patient Name: Elizabeth Landau C. Date of Service: 08/01/2021 12:45 PM Medical Record Number: [id number] Patient Account Number: [id number] Date of Birth/Sex: 1955/08/27 (66 y.o. F) Treating RN: Araceli Bouche,  [place] Primary Care Tahlor Berenguer: Ranae Plumber Other Clinician: Referring Alyscia Carmon: Ranae Plumber Treating Clothilde Tippetts/Extender: Skipper Cliche in Treatment: 0 Wound Status Wound Number: 8 Primary Lymphedema Etiology: Wound Location: Left, Lateral Lower Leg Wound Open Wounding Event: Gradually Appeared Status: Date Acquired: 07/18/2021 Comorbid Cataracts, Chronic sinus problems/congestion, Anemia, Weeks Of Treatment: 0 History: Lymphedema, Asthma, Sleep Apnea, Tuberculosis, Angina, Clustered Wound: Yes Hypertension, Gout, Osteoarthritis Photos Wound Measurements Length: (cm) 3 Width: (cm) 13 Depth: (cm) 0.1 Clustered Quantity: 2 Area: (cm) 30.631 Volume: (cm) 3.063 % Reduction in Area: % Reduction in Volume: Epithelialization: None Tunneling: No Undermining: No Wound Description Classification: Partial Thickness Exudate Amount: Medium Exudate Type: Serosanguineous Exudate Color: red, brown Foul Odor After Cleansing: No Slough/Fibrino No Wound Bed Granulation Amount: Medium (34-66%) Exposed Structure Granulation Quality: Pink Fat Layer (Subcutaneous Tissue) Exposed: Yes Necrotic Amount: None Present (0%) Treatment Notes Wound #8 (Lower Leg) Wound Laterality: Left, Lateral Cleanser Peri-Wound Care Topical Primary Dressing Silvercel 4 1/4x 4 1/4 (in/in) Discharge Instruction: Apply Silvercel 4 1/4x 4 1/4 (in/in) as instructed [name], [name] ([id number]) Secondary Dressing ABD Pad 5x9 (in/in) Discharge Instruction: Cover with ABD pad Secured With 46M Medipore H Soft Cloth Surgical Tape, 2x2 (in/yd) ACE elastic bandage 6 in Compression Wrap Compression Stockings Add-Ons Electronic Signature(s) Signed: 08/01/2021 4:34:33 PM By: [name] Dredge Entered By: [name] Dredge on 08/01/2021 13:51:58 Blaisdell, [name] ([id number]) -------------------------------------------------------------------------------- Vitals Details Patient Name: [name]. Date of Service: 08/01/2021 12:45 PM Medical Record Number: [id number] Patient Account Number: [id number] Date of Birth/Sex: 03-17-1956 (66 y.o. F) Treating RN: [name] Dredge Primary Care Craigory Toste: Ranae Plumber Other Clinician: Referring Noely Kuhnle: Ranae Plumber Treating Rayvn Rickerson/Extender: Skipper Cliche in Treatment: 0 Vital Signs Time Taken: 13:04 Temperature (F): 97.9 Height (in): 63 Pulse (bpm): 71 Source: Stated Respiratory Rate (breaths/min): 18 Weight (lbs): 372 Blood Pressure (mmHg): 144/87 Source: Stated Reference Range: 80 - 120 mg / dl Body Mass Index (BMI): 65.9 Notes pt unable to get both legs up on scale Electronic Signature(s) Signed: 08/01/2021 4:34:33 PM By: [name] Dredge Entered By: [name] Dredge on 08/01/2021 13:07:51

## 2021-08-02 NOTE — Progress Notes (Signed)
[name], [name] ([id number]) Visit Report for 08/01/2021 Chief Complaint Document Details Patient Name: [name], [name]. Date of Service: 08/01/2021 12:45 PM Medical Record Number: [id number] Patient Account Number: [id number] Date of Birth/Sex: 10-Mar-1956 (66 y.o. F) Treating RN: [name] Dredge Primary Care Provider: Ranae Plumber Other Clinician: Referring Provider: Ranae Plumber Treating Provider/Extender: Skipper Cliche in Treatment: 0 Information Obtained from: Patient Chief Complaint Bilateral LE lymphedema with Left LE Ulcers Electronic Signature(s) Signed: 08/01/2021 1:46:04 PM By: Worthy Keeler PA-C Entered By: Worthy Keeler on 08/01/2021 13:46:04 [place], [name] ([id number]) -------------------------------------------------------------------------------- Debridement Details Patient Name: [name] Date of Service: 08/01/2021 12:45 PM Medical Record Number: [id number] Patient Account Number: [id number] Date of Birth/Sex: 10/25/1955 (66 y.o. F) Treating RN: [name] Dredge Primary Care Provider: Ranae Plumber Other Clinician: Referring Provider: Ranae Plumber Treating Provider/Extender: Skipper Cliche in Treatment: 0 Debridement Performed for Wound #8 Left,Lateral Lower Leg Assessment: Performed By: Physician Tommie Sams., PA-C Debridement Type: Chemical/Enzymatic/Mechanical Agent Used: saline gauze Level of Consciousness (Pre- Awake and Alert procedure): Pre-procedure Verification/Time Out Yes - 14:05 Taken: Instrument: Other : saline gauze Bleeding: None Response to Treatment: Procedure was tolerated well Level of Consciousness (Post- Awake and Alert procedure): Post Debridement Measurements of Total Wound Length: (cm) 3 Width: (cm) 13 Depth: (cm) 0.1 Volume: (cm) 3.063 Character of Wound/Ulcer Post Debridement: Stable Post Procedure Diagnosis Same as Pre-procedure Electronic Signature(s) Signed: 08/01/2021 4:34:33 PM By:  [name] Dredge Signed: 08/01/2021 6:21:10 PM By: Worthy Keeler PA-C Entered By: [name] Dredge on 08/01/2021 14:06:11 [place], [name] ([id number]) -------------------------------------------------------------------------------- HPI Details Patient Name: [name]. Date of Service: 08/01/2021 12:45 PM Medical Record Number: [id number] Patient Account Number: [id number] Date of Birth/Sex: [birth year] (66 y.o. F) Treating RN: [name] Dredge Primary Care Provider: Ranae Plumber Other Clinician: Referring Provider: Ranae Plumber Treating Provider/Extender: Skipper Cliche in Treatment: 0 History of Present Illness HPI Description: The patient is a 66 year old female with history of hypertension and a long-standing history of bilateral lower extremity lymphedema (first presented on 4/2) . She has had open ulcers in the past which have always responded to compression therapy. She had briefly been to a lymphedema clinic in the past which helped her at the time. this time around she stopped treatment of her lymphedema pumps approximately 2 weeks ago because of some pain in the knees and then noticed the right leg getting worse. She was seen by her PCP who put her on clindamycin 4 times a day 2 days ago. The patient has seen [facility] and Dr. Delana Meyer had seen her last year where a vascular study including venous and arterial duplex studies were within normal limits. he had recommended compression stockings and lymphedema pumps and the patient has been using this in about 2 weeks ago. She is known to be diabetic but in the past few time she's gone to her primary care doctor her hemoglobin A1c has been normal. 02/11/2015 - after her last visit she took my advice and went to the ER regarding the progressive cellulitis of her right lower extremity and she was admitted between July 17 and 22nd. She received IV antibiotics and then was sent home on a course of steroid-induced and oral  antibiotics. She has improved much since then. 02/17/2015 -- she has been doing fine and the weeping of her legs has remarkably gone down. She has no fresh issues. READMISSION 01/15/18 This patient was given this clinic before most recently in 2016  seen by Dr. Con Memos. She has massive bilateral lymphedema and over the last 2 months this had weeping edema out of the left leg. She has compression pumps but her compliance with these has been minimal. She has [facility] they've been using TCA/ABDs/kerlix under an Ace wrap.she has had recent problems with cellulitis. She was apparently seen in the ER and 12/23/17 and given clindamycin. She was then followed by her primary doctor and given doxycycline and Keflex. The pain seems to have settled down. In April 2018 the patient had arterial studies done at [facility]. This showed triphasic waveforms throughout the right leg and mostly triphasic waveforms on the left except for monophasic at the posterior tibial artery distally. She was not felt to have evidence of right lower extremity arterial stenosis or significant problems on the left side. 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 She also had a right lower extremity venous Doppler in January 2018 this was limited by the patient's body habitus and lymphedema. Most of the proximal veins were not visualized The patient presents with an area of denuded skin on the anterior medial part of the left calf. There is weeping edema fluid here. 01/22/18; the patient has somewhat better edema control using her compression pumps twice a day and as a result she has much better epithelialization on the left anterior calf area. Only a small open area remains. 01/29/18; the patient has been compliant with her compression pumps. Both the areas on her calf that healed. The remaining area on the left anterior leg is fully epithelialized Readmission: 02/20/2019 upon evaluation  today patient presents for reevaluation due to issues that she is having with the bilateral lower extremities. She actually has wounds open on both legs. On the right she has an area in the crease of her leg on the right around the knee region which is actually draining quite a bit and actually has some fungal type appearance to it. She has been on nystatin powder that seems to have helped to some degree. In regard to the left lower extremity this is actually in the lower portion of her leg closer to the ankle and again is continuing to drain as well unfortunately. There does not appear to be any signs of active infection at this time which is good news. No fevers, chills, nausea, vomiting, or diarrhea. She tells me that since she was seen last year she is actually been doing quite well for the most part with regard to her lower extremities. Unfortunately she now is experiencing a little bit more drainage at this time. She is concerned about getting this under control so that it does not get significantly worse. 02/27/2019 on evaluation today patient appears to be doing somewhat better in regard to her bilateral lower extremity wounds. She has been tolerating the dressing changes without complication. Fortunately there is no signs of active infection at this point. No fevers, chills, nausea, vomiting, or diarrhea. She did get her dressing supplies which is excellent news she was extremely excited to get these. She also got paperwork from [employer] for their financial assistance program where they may be able to help her out in the future if needed with supplies at discounted prices. 03/06/2019 on evaluation today patient appears to be doing a little worse with regard to both areas of weeping on her bilateral lower extremities. This is around the right medial knee and just above the left ankle. With that being said she  is unfortunately not doing as well as I would like to see. I feel like she may need to  potentially go see someone at the lymphedema clinic as the wraps that she needs or even beyond what we can do here at the wound care center. She really does not have wounds she just has open areas of weeping that are causing some difficulty for her. Subsequently because of this and the moisture I am concerned about the potential for infection I am going to likely give her a prophylactic antibiotic today, Keflex, just to be on the safe side. Nonetheless again there is no obvious signs of active infection at this time. 03/13/2019 on evaluation today patient appears to be doing well with regard to her bilateral lower extremities where she has been weeping compared to even last week's evaluation. I see some areas of new skin growth which is excellent and overall I am very pleased with how things seem to be progressing. No fevers, chills, nausea, vomiting, or diarrhea. [name], [name] ([id number]) 03/20/2019 on evaluation today patient unfortunately is continuing to have issues with significant edema of the left lower extremity. Her right side seems to be doing much better. Unfortunately her left side is showing increased weeping of the lower portion of her leg. This is quite unfortunate obviously we were hoping to get her into the lymphedema clinic they really do not seem to when I see her how if she is draining. Despite the fact this is really not wound related but more lymphedema weeping related. Nonetheless I do not know that this can be helpful for her to even go for that appointment since again I am not sure there is much that they would actually do at this point. We may need to try a 4 layer compression wrap as best we can on her leg. She is on the Augmentin currently although I am still concerned about whether or not there could be potentially something going on infection wise I would obtain a culture though I understand is not the best being that is a surface culture I just 1 to make sure I do not  seem to be missing anything. 03/27/2019 on evaluation today patient appears to be doing much better in regard to the left lower extremity compared to last week. Last week she had tremendous weeping which I think was subsequent to infection now she seems to be doing much better and very pleased. This is not completely healed but there is a lot of new skin growth and it has dried out quite a bit. Overall I think that we are doing well with how things are moving along at this time. No fevers, chills, nausea, vomiting, or diarrhea. 04/03/2019 evaluation today patient appears to be doing a little worse this week compared to last time I saw her. I think this may be due to the fact that she is having issues with not being able to sleep in her bed at least not until last night. She is therefore been in a lift chair and subsequently has also had issues with not been able to use her pumps since she could not get in bed. With that being said the patient overall seems to be doing okay I do think I may want extend the antibiotic for a little bit longer at least until we can see if her edema and her weeping gets better and if it is then obviously I can always discontinue the antibiotics as of next week however  I want her to continue to have it over the next week. 04/10/2019 on evaluation today patient unfortunately is still doing poorly with regard to her left lower extremity. Her right is all things considering doing fairly well. On the left however she continues to have spreading of the area of infection and weeping which appears to be even a larger surface area than noted last week. She did have a positive culture for Pseudomonas in particular which seems to have been of concern she still has green/yellow discharge consistent with Pseudomonas and subsequently a tremendous amount of it. This has me obviously still concerned about the infection not really clearing up despite the fact that on culture it appears the  Cipro should have been a good option for treating this. I think she may at this point need IV antibiotics since things are not doing better I do not want to get worse and cause sepsis. She is in agreement with the plan and believes as well that she likely does need to go to the hospital for IV vancomycin. Or something of the like depending on what the recommendation is from the ER. 04/17/2019 on evaluation today patient appears to be doing excellent in regard to her lower extremity on the left. She was in the hospital for several days from when I sent her last we saw her until just this past Tuesday. Fortunately her drainage is significantly improved and in fact is mostly clear. There is just a couple small areas that may still drain a little bit she states that the [facility] they prescribed for her at discharge she went picked up from pharmacy and got home but has not been able to find it since. She is looked everywhere. She is wondering if I will replace that for her today I will be more than happy to do that. 05/01/2019 on evaluation today patient actually appears to be doing quite well with regard to her lower extremities. She occasionally is having areas that will leak and then heal up mainly when a piece of the fibrotic skin pops off but fortunately she is not having any signs of active infection at this time. Overall she also really does not have any obvious weeping at this time. I do believe however she really needs some compression wraps and I think this may be a good time to get her back to the lymphedema clinic. 05/11/2019 on evaluation today patient actually appears to be doing quite well with regard to her bilateral lower extremities. She occasionally will have a small area that we per another but in general seems to be completely healed which is great news. Overall very pleased with how everything seems to be progressing. She does have her appointment with lymphedema clinic on November  18. 05/25/2019 on evaluation today patient appears to be doing well with regard to her left lower extremity. I am very pleased in this regard. In regard to her right leg this actually did start draining more I think it is mainly due to the fact that her leg is more swollen. I am not seeing any obvious signs of infection at this time although that is definitely something were obviously acutely aware of simply due to the fact that she had an issue not too far back with exactly this issue. Nonetheless I do feel like that lymphedema clinic would still be beneficial for her. I explained obviously if they are not able to do anything treatment wise on the right leg we could at least  have them treat her left leg and then proceed from there. The patient is really in agreement with that plan. If they are able to do both as the drainage slows down that I would be happy to let them handle both. 06/01/2019 on evaluation today patient unfortunately appears to be doing worse with regard to her right lower extremity. The left lower extremity is still maintaining at this point. Unfortunately she has been having significantly increased pain over the past several days and has been experiencing as well increased swelling of the right lower extremity. I really do not know that I am seeing anything that appears to be obvious for infection at this point to be peripherally honest. With that being said the patient does seem to be having much more swelling that she is even experienced in the past and coupled with increased pain in her hip as well I am concerned that again she could potentially have a DVT although I am not 100% sure of this. I think it something that may need to be checked out. We discussed the possibility of sending her for a DVT study through the hospital but unfortunately transportation is an issue if she does have a DVT I do not want her to wait days to be able to get in for that test however if she has  this scheduled as an outpatient that is as fast that she will be able to get the test scheduled for transportation purposes. That will also fall on Thanksgiving so subsequently she did actually be looking at either Friday or even next week before we would know anything back from this. That is much too long in my opinion. Subsequent to the amount of discomfort she is experiencing the patient is actually okay with going to the ER for evaluation today. 06/12/2019 on evaluation today patient actually appears to be doing significantly better compared to last time I saw her. Following when I last saw her she was actually in the hospital from that Monday until the following Sunday almost 1 full week. She actually was placed on Keflex in the hospital following the time for her to be discharged and Dr. Steva Ready has recommended 2 times a day dosing of the Keflex for the next year in order to help with more prophylactic/preventative measures with regard to her developing cellulitis. Overall I think this sounds like an excellent plan. The patient unfortunately is good to have trouble being treated at lymphedema clinic due to the fact that she really cannot get up on the bed that they have there. They also state that they cannot manage her as long as she has anything draining at this point. Obviously that is somewhat unfortunate as she does need help with edema control but nonetheless we will have to do what we can for her outside of it sounds like the lymphedema clinic scenario at this point. 06/19/2019 on evaluation today patient appears to be doing fairly well with regard to her bilateral lower extremities. She is not nearly as swollen and shows no signs of infection at this point. There is no evidence of cellulitis whatsoever. She also has no open wounds or draining at this point which is also good news. No fever chills noted. She seems to be in very good spirits and in fact appears to be doing quite  well. READMISSION 11/27/2019 [name], [name] ([id number]) This is a 66 year old woman that we have had in this clinic several times before including 2015, 16 and 19 and then  most recently from 03/20/2019 through 06/19/2019 with bilateral lower extremity lymphedema. She has had previous arterial and reflux studies done years ago which were not all that remarkable. 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 She does have a positive family history and she had large legs starting may be in her 71s. She was recently in hospital from 10/20/2019 through 10/28/2019 with right leg cellulitis. She was given Ancef and clindamycin and then Zosyn when a culture showed Pseudomonas. At that time there was purulent drainage. She was followed by infectious disease Dr Steva Ready. The patient is now back at home. She has noted increased swelling in the right and no drainage in her right leg mostly on the posterior medial aspect in the calf area. She has not had pain or fever. She has literally been improved lysing above dressings because her at the area of this is far too large for standard compression. She has been wrapping the areas with sheets to resorptive pads. She is found these helped somewhat. She does have an appointment with the lymphedema clinic in [place] in late June. Past medical history includes bilateral lymphedema, hypertension, obstructive sleep apnea with CPAP. Recent hospitalization with apparently Pseudomonas cellulitis of the right lower leg 12/15/2019 upon evaluation today patient appears to be doing a little bit worse in regard to her right lower extremity. Unfortunately she is having more weeping down in the lower portion of her leg. Fortunately there is no signs of active infection at this time. No fever chills noted. The patient states she is not having increased pain except for when she attempted to use the lymphedema pumps unfortunately  she states that she did have pain when she did this. Otherwise we been using absorptive dressings of one type or another she is using diapers at home and then subsequently Ace wraps. In regard to the barrier cream we have discussed the possibility of derma cloud which she would like to try I do not have a problem with that. 12/22/2019 upon evaluation today patient actually appears to be doing better in regard to her leg ulcers at this point. Fortunately there does not appear to be any signs of active infection which is great news and I am extremely pleased with where things are progressing at this time. There is no sign of active infection currently. The patient is very pleased to see things doing so well. 12/29/2019 upon evaluation today patient appears to be doing a little bit better in regard to her weeping in general over her lower extremities. She does have some signs of mild erythema little bit more than what I noted last week or rather last visit. Nonetheless I think that my threshold for switching her antibiotics from Keflex to something else is very low at this point considering that she has had such severe infections in the past that seem to come almost out of nowhere. There is a little erythema and warmth noted of the lower portion of her leg compared to the upper which also makes me want to go ahead and address things more rapidly at this point. Likely I would switch out the Keflex for something like Levaquin ideally. 7/16; patient with severe bilateral lymphedema. She has superficial wounds albeit almost circumferential now on the left lateral lower leg. This may be new from last time. Small area on the right anterior lower leg and then another area on the right medial lower leg and of pannus fold. She  has been using various absorptive garments. She states she is using her compression pumps once a day occasionally twice. Culture from her last visit here was negative 01/29/2020 on  evaluation today patient appears to be doing excellent at this point in regard to her legs with regard to infection I see no signs of active infection at this point. She still does have unfortunately areas of weeping this is minimal on the right now her left is actually significantly worse although I do not think it is as bad as last week with Dr. Dellia Nims saw her. She has been trying to pump and elevate her legs is much as possible. She has previously been on the Keflex and in the past for prevention that seems to do fairly well and likely can extend that today. 02/04/2020 on evaluation today patient appears to be doing better in regard to her legs bilaterally. Fortunately there is no signs of active infection at this time which is great news and overall she has less weeping on the left compared to the right and there is several spots where she is pretty much sealed up with no draining regions. Overall very pleased in this regard. 02/19/2020 on evaluation today patient appears to be doing very well in regard to her wounds currently. Fortunately there is no evidence of active infection overall very pleased with where things stand. She is significantly improved in regard to her edema I am extremely pleased in this regard she tells me that the popping no longer hurts and in fact she actually looks forward to it. 03/04/2020 on evaluation today patient appears to be doing excellent in regard to her lower extremities. Fortunately there is no signs of active infection at this time. No fevers, chills, nausea, vomiting, or diarrhea. 03/25/2020 on evaluation today patient appears to be doing a little bit more poorly in regard to her legs at this point. She tells me that she is still continue to have issues with drainage and this has been a little bit worse she was getting ready to start taking the Keflex again but wanted to see me first. Fortunately there is no signs of active infection at this time. No fevers,  chills, nausea, vomiting, or diarrhea. 04/15/2020 upon evaluation today patient appears to be doing somewhat poorly in regard to her right leg. She tells me she has been having more pain she has been taking the Keflex that was previously prescribed unfortunately that just does not seem to help with this. She was hoping that the pain on her right was actually coming from the fact that she was having issues with her wrap having gotten caught in her recliner. With that being said she tells me that she knew something was not right. Currently her right leg is warm to touch along with being erythematous all the way up to around at least mid thigh as far as I can see. The left leg does not appear to be doing that badly though there is increased weeping around the ankle region. 05/06/2020 on evaluation today patient appears to be doing much better than last time I saw her. She did go to the hospital where she was admitted for 2 days and treated with antibiotic therapy. She was discharged with antibiotics as well and has done extremely well. I am extremely pleased with where things stand today. There is no signs of active infection at this time which is great news. 05/27/2020 upon evaluation today patient appears to be doing well with regard  to her lower extremities bilaterally. She has just a very tiny area on the right leg which is opening on the left leg she is significantly improved though she still has several areas that do appear to be open this is minimal compared to what is been in the past. In general I am extremely pleased with where things stand today. The patient does tell me she is not been using her pumps quite as much as she should be. I do believe that is 1 area she can definitely work on. She has had a lot going on including a Covid exposure and apparently also a outbreak of likely shingles. 06/24/2020 upon evaluation today patient appears to be doing well with regard to her legs in general  although the left leg unfortunately is showing some signs of erythema she does have a little bit of increased weeping and to be honest I am concerned about infection here. I discussed that with her today and I think that we may need to address this sooner rather than later she has been taking Keflex she is not really certain that is been making a big improvement however. No fevers, chills, nausea, vomiting, or diarrhea. [name], [name] ([id number]) 06/30/2020 upon evaluation today patient's legs actually seem to be doing better in my opinion as compared to where they were last week. Fortunately there does not appear to be any signs of active infection. Her culture showed multiple organisms nothing predominate. With that being said the Levaquin seems to have done well I think she has improved since I last saw her as well. 07/14/2020 upon evaluation today patient appears to be doing actually better in regard to her lower extremities in my opinion. She has been tolerating the dressing changes without complication. Fortunately there is no signs of active infection at this time. 08/04/2020 upon evaluation today patient appears to be doing excellent in regard to her leg ulcers. Fortunately she has very little that is open at this point. This is great news. In fact I think that the Goldbond medicated powder has been excellent for her. It seems to have done the trick where we had tried several other things without as much success. Fortunately there is no evidence of active infection at this time. No fevers, chills, nausea, vomiting, or diarrhea. 09/01/2020 upon evaluation today patient appears to be doing a little bit more poorly than the last time I saw her. She tells me right now that she has been having a lot of drainage compared to where things were previous which has unfortunately led to more irritation as well. She is concerned that this is leading to infection. Again based on what I am seeing today as  well I am also concerned of the same to be honest. 09/15/2020 upon evaluation today patient appears to be doing well with regard to her legs compared to what I saw previous. Fortunately there does not appear to be any evidence of active infection at this time which is great news. At least not as badly as what it was previous. With that being said I do believe that the patient does need to have an extension of the antibiotics. This I believe will actually help her more in the way of making sure this stays under control and does not worsen. That was the Bactrim DS. Readmission: 08/01/2021 upon evaluation today patient appears to be doing actually pretty well all things considered. Is actually been almost a year since have seen her last March. Fortunately I  do not see any evidence of active infection locally nor systemically at this point. She does have a couple open areas on the left leg the right leg appears to be doing quite well. In general she has been in the hospital 3 times since I saw her a year ago all 4 cellulitis type issues except for the last 1 which was actually more related to congestive heart failure for that reason she is not using her lymphedema pumps at this point and that is probably the best idea. Electronic Signature(s) Signed: 08/01/2021 2:02:45 PM By: Worthy Keeler PA-C Entered By: Worthy Keeler on 08/01/2021 14:02:45 Nehring, [name] ([id number]) -------------------------------------------------------------------------------- Physical Exam Details Patient Name: [name], [name] Date of Service: 08/01/2021 12:45 PM Medical Record Number: [id number] Patient Account Number: [id number] Date of Birth/Sex: 02/16/1956 (66 y.o. F) Treating RN: [name] Dredge Primary Care Provider: Ranae Plumber Other Clinician: Referring Provider: Ranae Plumber Treating Provider/Extender: Skipper Cliche in Treatment: 0 Constitutional patient is hypertensive.. pulse regular and within  target range for patient.Marland Kitchen respirations regular, non-labored and within target range for patient.Marland Kitchen temperature within target range for patient.. Obese and well-hydrated in no acute distress. Eyes conjunctiva clear no eyelid edema noted. pupils equal round and reactive to light and accommodation. Ears, Nose, Mouth, and Throat no gross abnormality of ear auricles or external auditory canals. normal hearing noted during conversation. mucus membranes moist. Respiratory normal breathing without difficulty. Cardiovascular Patient has bilateral stage III lymphedema. Musculoskeletal Patient unable to walk without assistance. no significant deformity or arthritic changes, no loss or range of motion, no clubbing. Psychiatric this patient is able to make decisions and demonstrates good insight into disease process. Alert and Oriented x 3. pleasant and cooperative. Notes Upon inspection patient's wound bed actually showed signs of have a lot of skin growth I am actually very pleased with what I am seeing although honestly I think this is something we have seen in the past can definitely change very quickly. Fortunately I do not see any signs though that she seems to be worsening in general which is great news and I am hopeful that she will actually be able to continue to see signs of this improving growing new tissue not having any additional complications or problems. Electronic Signature(s) Signed: 08/01/2021 2:03:36 PM By: Worthy Keeler PA-C Entered By: Worthy Keeler on 08/01/2021 14:03:35 Daigler, [name] ([id number]) -------------------------------------------------------------------------------- Physician Orders Details Patient Name: [name] Date of Service: 08/01/2021 12:45 PM Medical Record Number: [id number] Patient Account Number: [id number] Date of Birth/Sex: October 13, 1955 (66 y.o. 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 o Nurse Visit as needed Bathing/ Shower/ Hygiene o May shower; gently cleanse wound with antibacterial soap, rinse and pat dry prior to dressing wounds Wound Treatment Wound #8 - Lower Leg Wound Laterality: Left, Lateral Primary Dressing: Silvercel 4 1/4x 4 1/4 (in/in) (DME) (Generic) 1 x Per Day/30 Days Discharge Instructions: Apply Silvercel 4 1/4x 4 1/4 (in/in) as instructed Secondary Dressing: ABD Pad 5x9 (in/in) (DME) (Generic) 1 x Per Day/30 Days Discharge Instructions:  Cover with ABD pad Secured With: 44M Medipore H Soft Cloth Surgical Tape, 2x2 (in/yd) (DME) (Generic) 1 x Per Day/30 Days Secured With: ACE elastic bandage 6 in (Generic) 1 x Per Day/30 Days Electronic Signature(s) Signed: 08/01/2021 4:34:33 PM By: [name] Dredge Signed: 08/01/2021 6:21:10 PM By: Worthy Keeler PA-C Entered By: [name] Dredge on 08/01/2021 14:11:01 Froning, [name] ([id number]) -------------------------------------------------------------------------------- Problem List Details Patient Name: [name], [name]. Date of Service: 08/01/2021 12:45 PM Medical Record Number: [id number] Patient Account Number: [id number] Date of Birth/Sex: 04-29-1956 (66 y.o. 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 Date of Service: 08/01/2021 12:45 PM Medical Record Number: [id number] Patient Account Number: [id number] Date of Birth/Sex: Jan 13, 1956 (66 y.o. F) Treating RN: [name] Dredge Primary Care Provider: Ranae Plumber Other Clinician: Referring Provider: Ranae Plumber Treating Provider/Extender: Skipper Cliche in Treatment: 0 Subjective Chief Complaint Information obtained from Patient Bilateral LE lymphedema with Left LE Ulcers History of Present Illness (HPI) The patient is a 65 year old female with history of hypertension and a long-standing history of bilateral lower extremity lymphedema (first presented on 4/2) . She has had open ulcers in the past which have always responded to compression therapy. She had briefly been to a lymphedema clinic in the past which helped her at the time. this time around she stopped treatment of her lymphedema pumps approximately 2 weeks ago because of some pain in the knees and then noticed the right leg getting worse. She was seen by her PCP who put her on clindamycin 4 times a day 2 days ago. The patient has seen [facility] and Dr. Delana Meyer had seen her last year where a vascular study including venous and arterial duplex studies were within normal limits. he had recommended compression stockings and lymphedema pumps and the patient has been using this in about 2 weeks ago. She is known to be diabetic  but in the past few time she's gone to her primary care doctor her hemoglobin A1c has been normal. 02/11/2015 - after her last visit she took my advice and went to the ER regarding the progressive cellulitis of her right lower extremity and she was admitted between July 17 and 22nd. She received IV antibiotics and then was sent home on a course of steroid-induced and oral antibiotics. She has improved much since then. 02/17/2015 -- she has been doing fine and the weeping of her legs has remarkably gone down. She has no fresh issues. READMISSION 01/15/18 This patient was given this clinic before most recently in 2016 seen by Dr. Con Memos. She has massive bilateral lymphedema and over the last 2 months this had weeping edema out of the left leg. She has compression pumps but her compliance with these has been minimal. She has [facility] they've been using TCA/ABDs/kerlix under an Ace wrap.she has had recent problems with cellulitis. She was apparently seen  in the ER and 12/23/17 and given clindamycin. She was then followed by her primary doctor and given doxycycline and Keflex. The pain seems to have settled down. In April 2018 the patient had arterial studies done at [facility]. This showed triphasic waveforms throughout the right leg and mostly triphasic waveforms on the left except for monophasic at the posterior tibial artery distally. She was not felt to have evidence of right lower extremity arterial stenosis or significant problems on the left side. 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 She also had a right lower extremity venous Doppler in January 2018 this was limited by the patient's body habitus and lymphedema. Most of the proximal veins were not visualized The patient presents with an area of denuded skin on the anterior medial part of the left calf. There is weeping edema fluid here. 01/22/18; the patient has somewhat better edema control  using her compression pumps twice a day and as a result she has much better epithelialization on the left anterior calf area. Only a small open area remains. 01/29/18; the patient has been compliant with her compression pumps. Both the areas on her calf that healed. The remaining area on the left anterior leg is fully epithelialized Readmission: 02/20/2019 upon evaluation today patient presents for reevaluation due to issues that she is having with the bilateral lower extremities. She actually has wounds open on both legs. On the right she has an area in the crease of her leg on the right around the knee region which is actually draining quite a bit and actually has some fungal type appearance to it. She has been on nystatin powder that seems to have helped to some degree. In regard to the left lower extremity this is actually in the lower portion of her leg closer to the ankle and again is continuing to drain as well unfortunately. There does not appear to be any signs of active infection at this time which is good news. No fevers, chills, nausea, vomiting, or diarrhea. She tells me that since she was seen last year she is actually been doing quite well for the most part with regard to her lower extremities. Unfortunately she now is experiencing a little bit more drainage at this time. She is concerned about getting this under control so that it does not get significantly worse. 02/27/2019 on evaluation today patient appears to be doing somewhat better in regard to her bilateral lower extremity wounds. She has been tolerating the dressing changes without complication. Fortunately there is no signs of active infection at this point. No fevers, chills, nausea, vomiting, or diarrhea. She did get her dressing supplies which is excellent news she was extremely excited to get these. She also got paperwork from [employer] for their financial assistance program where they may be able to help her out in the future  if needed with supplies at discounted prices. 03/06/2019 on evaluation today patient appears to be doing a little worse with regard to both areas of weeping on her bilateral lower extremities. This is around the right medial knee and just above the left ankle. With that being said she is unfortunately not doing as well as I would like to see. I feel like she may need to potentially go see someone at the lymphedema clinic as the wraps that she needs or even beyond what we can do here at the wound care center. She really does not have wounds she just has open areas  of weeping that are causing some difficulty for her. Subsequently because of this and the moisture I am concerned about the potential for infection I am going to likely give her a prophylactic antibiotic today, Keflex, just to be on the safe side. Nonetheless again there is no obvious signs of active infection at this time. CHAYLEE, EHRSAM ([id number]) 03/13/2019 on evaluation today patient appears to be doing well with regard to her bilateral lower extremities where she has been weeping compared to even last week's evaluation. I see some areas of new skin growth which is excellent and overall I am very pleased with how things seem to be progressing. No fevers, chills, nausea, vomiting, or diarrhea. 03/20/2019 on evaluation today patient unfortunately is continuing to have issues with significant edema of the left lower extremity. Her right side seems to be doing much better. Unfortunately her left side is showing increased weeping of the lower portion of her leg. This is quite unfortunate obviously we were hoping to get her into the lymphedema clinic they really do not seem to when I see her how if she is draining. Despite the fact this is really not wound related but more lymphedema weeping related. Nonetheless I do not know that this can be helpful for her to even go for that appointment since again I am not sure there is much that they  would actually do at this point. We may need to try a 4 layer compression wrap as best we can on her leg. She is on the Augmentin currently although I am still concerned about whether or not there could be potentially something going on infection wise I would obtain a culture though I understand is not the best being that is a surface culture I just 1 to make sure I do not seem to be missing anything. 03/27/2019 on evaluation today patient appears to be doing much better in regard to the left lower extremity compared to last week. Last week she had tremendous weeping which I think was subsequent to infection now she seems to be doing much better and very pleased. This is not completely healed but there is a lot of new skin growth and it has dried out quite a bit. Overall I think that we are doing well with how things are moving along at this time. No fevers, chills, nausea, vomiting, or diarrhea. 04/03/2019 evaluation today patient appears to be doing a little worse this week compared to last time I saw her. I think this may be due to the fact that she is having issues with not being able to sleep in her bed at least not until last night. She is therefore been in a lift chair and subsequently has also had issues with not been able to use her pumps since she could not get in bed. With that being said the patient overall seems to be doing okay I do think I may want extend the antibiotic for a little bit longer at least until we can see if her edema and her weeping gets better and if it is then obviously I can always discontinue the antibiotics as of next week however I want her to continue to have it over the next week. 04/10/2019 on evaluation today patient unfortunately is still doing poorly with regard to her left lower extremity. Her right is all things considering doing fairly well. On the left however she continues to have spreading of the area of infection and weeping which appears to be  even a  larger surface area than noted last week. She did have a positive culture for Pseudomonas in particular which seems to have been of concern she still has green/yellow discharge consistent with Pseudomonas and subsequently a tremendous amount of it. This has me obviously still concerned about the infection not really clearing up despite the fact that on culture it appears the Cipro should have been a good option for treating this. I think she may at this point need IV antibiotics since things are not doing better I do not want to get worse and cause sepsis. She is in agreement with the plan and believes as well that she likely does need to go to the hospital for IV vancomycin. Or something of the like depending on what the recommendation is from the ER. 04/17/2019 on evaluation today patient appears to be doing excellent in regard to her lower extremity on the left. She was in the hospital for several days from when I sent her last we saw her until just this past Tuesday. Fortunately her drainage is significantly improved and in fact is mostly clear. There is just a couple small areas that may still drain a little bit she states that the [facility] they prescribed for her at discharge she went picked up from pharmacy and got home but has not been able to find it since. She is looked everywhere. She is wondering if I will replace that for her today I will be more than happy to do that. 05/01/2019 on evaluation today patient actually appears to be doing quite well with regard to her lower extremities. She occasionally is having areas that will leak and then heal up mainly when a piece of the fibrotic skin pops off but fortunately she is not having any signs of active infection at this time. Overall she also really does not have any obvious weeping at this time. I do believe however she really needs some compression wraps and I think this may be a good time to get her back to the lymphedema clinic. 05/11/2019  on evaluation today patient actually appears to be doing quite well with regard to her bilateral lower extremities. She occasionally will have a small area that we per another but in general seems to be completely healed which is great news. Overall very pleased with how everything seems to be progressing. She does have her appointment with lymphedema clinic on November 18. 05/25/2019 on evaluation today patient appears to be doing well with regard to her left lower extremity. I am very pleased in this regard. In regard to her right leg this actually did start draining more I think it is mainly due to the fact that her leg is more swollen. I am not seeing any obvious signs of infection at this time although that is definitely something were obviously acutely aware of simply due to the fact that she had an issue not too far back with exactly this issue. Nonetheless I do feel like that lymphedema clinic would still be beneficial for her. I explained obviously if they are not able to do anything treatment wise on the right leg we could at least have them treat her left leg and then proceed from there. The patient is really in agreement with that plan. If they are able to do both as the drainage slows down that I would be happy to let them handle both. 06/01/2019 on evaluation today patient unfortunately appears to be doing worse with regard to her right  lower extremity. The left lower extremity is still maintaining at this point. Unfortunately she has been having significantly increased pain over the past several days and has been experiencing as well increased swelling of the right lower extremity. I really do not know that I am seeing anything that appears to be obvious for infection at this point to be peripherally honest. With that being said the patient does seem to be having much more swelling that she is even experienced in the past and coupled with increased pain in her hip as well I am concerned  that again she could potentially have a DVT although I am not 100% sure of this. I think it something that may need to be checked out. We discussed the possibility of sending her for a DVT study through the hospital but unfortunately transportation is an issue if she does have a DVT I do not want her to wait days to be able to get in for that test however if she has this scheduled as an outpatient that is as fast that she will be able to get the test scheduled for transportation purposes. That will also fall on Thanksgiving so subsequently she did actually be looking at either Friday or even next week before we would know anything back from this. That is much too long in my opinion. Subsequent to the amount of discomfort she is experiencing the patient is actually okay with going to the ER for evaluation today. 06/12/2019 on evaluation today patient actually appears to be doing significantly better compared to last time I saw her. Following when I last saw her she was actually in the hospital from that Monday until the following Sunday almost 1 full week. She actually was placed on Keflex in the hospital following the time for her to be discharged and Dr. Steva Ready has recommended 2 times a day dosing of the Keflex for the next year in order to help with more prophylactic/preventative measures with regard to her developing cellulitis. Overall I think this sounds like an excellent plan. The patient unfortunately is good to have trouble being treated at lymphedema clinic due to the fact that she really cannot get up on the bed that they have there. They also state that they cannot manage her as long as she has anything draining at this point. Obviously that is somewhat unfortunate as she does need help with edema control but nonetheless we will have to do what we can for her outside of it sounds like the lymphedema clinic scenario at this point. 06/19/2019 on evaluation today patient appears to be doing  fairly well with regard to her bilateral lower extremities. She is not nearly as swollen [name], [name] ([id number]) and shows no signs of infection at this point. There is no evidence of cellulitis whatsoever. She also has no open wounds or draining at this point which is also good news. No fever chills noted. She seems to be in very good spirits and in fact appears to be doing quite well. READMISSION 11/27/2019 This is a 66 year old woman that we have had in this clinic several times before including 2015, 16 and 19 and then most recently from 03/20/2019 through 06/19/2019 with bilateral lower extremity lymphedema. She has had previous arterial and reflux studies done years ago which were not all that remarkable. 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 She does have a positive family history and she had large legs starting may be  in her 76s. She was recently in hospital from 10/20/2019 through 10/28/2019 with right leg cellulitis. She was given Ancef and clindamycin and then Zosyn when a culture showed Pseudomonas. At that time there was purulent drainage. She was followed by infectious disease Dr Steva Ready. The patient is now back at home. She has noted increased swelling in the right and no drainage in her right leg mostly on the posterior medial aspect in the calf area. She has not had pain or fever. She has literally been improved lysing above dressings because her at the area of this is far too large for standard compression. She has been wrapping the areas with sheets to resorptive pads. She is found these helped somewhat. She does have an appointment with the lymphedema clinic in [place] in late June. Past medical history includes bilateral lymphedema, hypertension, obstructive sleep apnea with CPAP. Recent hospitalization with apparently Pseudomonas cellulitis of the right lower leg 12/15/2019 upon evaluation today patient appears  to be doing a little bit worse in regard to her right lower extremity. Unfortunately she is having more weeping down in the lower portion of her leg. Fortunately there is no signs of active infection at this time. No fever chills noted. The patient states she is not having increased pain except for when she attempted to use the lymphedema pumps unfortunately she states that she did have pain when she did this. Otherwise we been using absorptive dressings of one type or another she is using diapers at home and then subsequently Ace wraps. In regard to the barrier cream we have discussed the possibility of derma cloud which she would like to try I do not have a problem with that. 12/22/2019 upon evaluation today patient actually appears to be doing better in regard to her leg ulcers at this point. Fortunately there does not appear to be any signs of active infection which is great news and I am extremely pleased with where things are progressing at this time. There is no sign of active infection currently. The patient is very pleased to see things doing so well. 12/29/2019 upon evaluation today patient appears to be doing a little bit better in regard to her weeping in general over her lower extremities. She does have some signs of mild erythema little bit more than what I noted last week or rather last visit. Nonetheless I think that my threshold for switching her antibiotics from Keflex to something else is very low at this point considering that she has had such severe infections in the past that seem to come almost out of nowhere. There is a little erythema and warmth noted of the lower portion of her leg compared to the upper which also makes me want to go ahead and address things more rapidly at this point. Likely I would switch out the Keflex for something like Levaquin ideally. 7/16; patient with severe bilateral lymphedema. She has superficial wounds albeit almost circumferential now on the left  lateral lower leg. This may be new from last time. Small area on the right anterior lower leg and then another area on the right medial lower leg and of pannus fold. She has been using various absorptive garments. She states she is using her compression pumps once a day occasionally twice. Culture from her last visit here was negative 01/29/2020 on evaluation today patient appears to be doing excellent at this point in regard to her legs with regard to infection I see no signs of active infection at  this point. She still does have unfortunately areas of weeping this is minimal on the right now her left is actually significantly worse although I do not think it is as bad as last week with Dr. Dellia Nims saw her. She has been trying to pump and elevate her legs is much as possible. She has previously been on the Keflex and in the past for prevention that seems to do fairly well and likely can extend that today. 02/04/2020 on evaluation today patient appears to be doing better in regard to her legs bilaterally. Fortunately there is no signs of active infection at this time which is great news and overall she has less weeping on the left compared to the right and there is several spots where she is pretty much sealed up with no draining regions. Overall very pleased in this regard. 02/19/2020 on evaluation today patient appears to be doing very well in regard to her wounds currently. Fortunately there is no evidence of active infection overall very pleased with where things stand. She is significantly improved in regard to her edema I am extremely pleased in this regard she tells me that the popping no longer hurts and in fact she actually looks forward to it. 03/04/2020 on evaluation today patient appears to be doing excellent in regard to her lower extremities. Fortunately there is no signs of active infection at this time. No fevers, chills, nausea, vomiting, or diarrhea. 03/25/2020 on evaluation today patient  appears to be doing a little bit more poorly in regard to her legs at this point. She tells me that she is still continue to have issues with drainage and this has been a little bit worse she was getting ready to start taking the Keflex again but wanted to see me first. Fortunately there is no signs of active infection at this time. No fevers, chills, nausea, vomiting, or diarrhea. 04/15/2020 upon evaluation today patient appears to be doing somewhat poorly in regard to her right leg. She tells me she has been having more pain she has been taking the Keflex that was previously prescribed unfortunately that just does not seem to help with this. She was hoping that the pain on her right was actually coming from the fact that she was having issues with her wrap having gotten caught in her recliner. With that being said she tells me that she knew something was not right. Currently her right leg is warm to touch along with being erythematous all the way up to around at least mid thigh as far as I can see. The left leg does not appear to be doing that badly though there is increased weeping around the ankle region. 05/06/2020 on evaluation today patient appears to be doing much better than last time I saw her. She did go to the hospital where she was admitted for 2 days and treated with antibiotic therapy. She was discharged with antibiotics as well and has done extremely well. I am extremely pleased with where things stand today. There is no signs of active infection at this time which is great news. 05/27/2020 upon evaluation today patient appears to be doing well with regard to her lower extremities bilaterally. She has just a very tiny area on the right leg which is opening on the left leg she is significantly improved though she still has several areas that do appear to be open this is minimal compared to what is been in the past. In general I am extremely pleased with where  things stand today. The  patient does tell me she is not been using her pumps quite as much as she should be. I do believe that is 1 area she can definitely work on. She has had a lot going on including a Covid exposure and apparently also a outbreak of likely shingles. [name], [name] ([id number]) 06/24/2020 upon evaluation today patient appears to be doing well with regard to her legs in general although the left leg unfortunately is showing some signs of erythema she does have a little bit of increased weeping and to be honest I am concerned about infection here. I discussed that with her today and I think that we may need to address this sooner rather than later she has been taking Keflex she is not really certain that is been making a big improvement however. No fevers, chills, nausea, vomiting, or diarrhea. 06/30/2020 upon evaluation today patient's legs actually seem to be doing better in my opinion as compared to where they were last week. Fortunately there does not appear to be any signs of active infection. Her culture showed multiple organisms nothing predominate. With that being said the Levaquin seems to have done well I think she has improved since I last saw her as well. 07/14/2020 upon evaluation today patient appears to be doing actually better in regard to her lower extremities in my opinion. She has been tolerating the dressing changes without complication. Fortunately there is no signs of active infection at this time. 08/04/2020 upon evaluation today patient appears to be doing excellent in regard to her leg ulcers. Fortunately she has very little that is open at this point. This is great news. In fact I think that the Gold[employer] medicated powder has been excellent for her. It seems to have done the trick where we had tried several other things without as much success. Fortunately there is no evidence of active infection at this time. No fevers, chills, nausea, vomiting, or diarrhea. 09/01/2020 upon  evaluation today patient appears to be doing a little bit more poorly than the last time I saw her. She tells me right now that she has been having a lot of drainage compared to where things were previous which has unfortunately led to more irritation as well. She is concerned that this is leading to infection. Again based on what I am seeing today as well I am also concerned of the same to be honest. 09/15/2020 upon evaluation today patient appears to be doing well with regard to her legs compared to what I saw previous. Fortunately there does not appear to be any evidence of active infection at this time which is great news. At least not as badly as what it was previous. With that being said I do believe that the patient does need to have an extension of the antibiotics. This I believe will actually help her more in the way of making sure this stays under control and does not worsen. That was the Bactrim DS. Readmission: 08/01/2021 upon evaluation today patient appears to be doing actually pretty well all things considered. Is actually been almost a year since have seen her last March. Fortunately I do not see any evidence of active infection locally nor systemically at this point. She does have a couple open areas on the left leg the right leg appears to be doing quite well. In general she has been in the hospital 3 times since I saw her a year ago all 4 cellulitis type issues except for  the last 1 which was actually more related to congestive heart failure for that reason she is not using her lymphedema pumps at this point and that is probably the best idea. Patient History Information obtained from Patient. Allergies ibuprofen (Reaction: hives, swells), ACE Inhibitors, vancomycin (Reaction: itching, nausea vomiting) Family History Cancer - Father, Diabetes - Father, Heart Disease - Mother, Hypertension - Mother,Father, Seizures - Mother, Stroke - Mother, Thyroid Problems - Mother,Siblings, No  family history of Hereditary Spherocytosis, Kidney Disease, Lung Disease, Tuberculosis. Social History Former smoker - uses snuff - ended on 07/09/1990, Marital Status - Single, Alcohol Use - Never, Drug Use - No History, Caffeine Use - Moderate - coffee. 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 Denies history of Aspiration, Chronic Obstructive Pulmonary Disease (COPD), Pneumothorax Cardiovascular Patient has history of Angina, Hypertension Denies history of Arrhythmia, Congestive Heart Failure, Coronary Artery Disease, Deep Vein Thrombosis, Hypotension, Myocardial Infarction, Peripheral Arterial Disease, Peripheral Venous Disease, Phlebitis, Vasculitis Gastrointestinal Denies history of Cirrhosis , Colitis, Crohn s, Hepatitis A, Hepatitis B, Hepatitis C Endocrine Denies history of Type I Diabetes, Type II Diabetes Genitourinary Denies history of End Stage Renal Disease Immunological Denies history of Lupus Erythematosus, Raynaud s, Scleroderma Integumentary (Skin) Denies history of History of Burn, History of pressure wounds Musculoskeletal Patient has history of Gout, Osteoarthritis Gibeault, Emry C. ([id number]) Denies history of Rheumatoid Arthritis, Osteomyelitis Neurologic Denies history of Dementia, Neuropathy, Quadriplegia, Paraplegia, Seizure Disorder Oncologic Denies history of Received Chemotherapy, Received Radiation Psychiatric Denies history of Anorexia/bulimia, Confinement Anxiety Hospitalization/Surgery History - [facility]. Review of  Systems (ROS) Constitutional Symptoms (General Health) Complains or has symptoms of Fatigue. Eyes Complains or has symptoms of Glasses / Contacts - bifocals. Respiratory Complains or has symptoms of Shortness of Breath. Gastrointestinal Denies complaints or symptoms of Frequent diarrhea, Nausea, Vomiting. Endocrine Denies complaints or symptoms of Hepatitis, Thyroid disease, Polydypsia (Excessive Thirst). Genitourinary Complains or has symptoms of Incontinence/dribbling - urine. Immunological Denies complaints or symptoms of Hives, Itching. Integumentary (Skin) Complains or has symptoms of Wounds - lymphedema, hx cellulitis Musculoskeletal Complains or has symptoms of Muscle Pain, Muscle Weakness. Neurologic Denies complaints or symptoms of Numbness/parasthesias, Focal/Weakness. Psychiatric Denies complaints or symptoms of Anxiety, Claustrophobia. Objective Constitutional patient is hypertensive.. pulse regular and within target range for patient.Marland Kitchen respirations regular, non-labored and within target range for patient.Marland Kitchen temperature within target range for patient.. Obese and well-hydrated in no acute distress. Vitals Time Taken: 1:04 PM, Height: 63 in, Source: Stated, Weight: 372 lbs, Source: Stated, BMI: 65.9, Temperature: 97.9 F, Pulse: 71 bpm, Respiratory Rate: 18 breaths/min, Blood Pressure: 144/87 mmHg. General Notes: pt unable to get both legs up on scale Eyes conjunctiva clear no eyelid edema noted. pupils equal round and reactive to light and accommodation. Ears, Nose, Mouth, and Throat no gross abnormality of ear auricles or external auditory canals. normal hearing noted during conversation. mucus membranes moist. Respiratory normal breathing without difficulty. Cardiovascular Patient has bilateral stage III lymphedema. Musculoskeletal Patient unable to walk without assistance. no significant deformity or arthritic changes, no loss or range of motion, no  clubbing. Psychiatric this patient is able to make decisions and demonstrates good insight into disease process. Alert and  Oriented x 3. pleasant and cooperative. General Notes: Upon inspection patient's wound bed actually showed signs of have a lot of skin growth I am actually very pleased with what I am seeing although honestly I think this is something we have seen in the past can definitely change very quickly. Fortunately I do not see any signs though that she seems to be worsening in general which is great news and I am hopeful that she will actually be able to continue to see signs of this improving growing new tissue not having any additional complications or problems. Integumentary (Hair, Skin) Shelnutt, Gwenna C. ([id number]) Wound #8 status is Open. Original cause of wound was Gradually Appeared. The date acquired was: 07/18/2021. The wound is located on the Left,Lateral Lower Leg. The wound measures 3cm length x 13cm width x 0.1cm depth; 30.631cm^2 area and 3.063cm^3 volume. There is Fat Layer (Subcutaneous Tissue) exposed. There is no tunneling or undermining noted. There is a medium amount of serosanguineous drainage noted. There is medium (34-66%) pink granulation within the wound bed. There is no necrotic tissue within the wound bed. Other Condition(s) Patient presents with Lymphedema located on the Right Leg. 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 Would recommend currently that we go ahead and initiate treatment for any open areas with silver alginate dressing I think this probably can be the best way to go. 2. I am also can recommend that we have the patient utilize ABD pad secured with tape at  this point using Ace wraps as well. 3. I would also suggest that she if she experiences a lot of drainage going go back to using the juxta wraps which have always done well for her in the past. I think that would be just fine right now I do not think that skin to be necessary. We will keep a close eye on things however she is very prone to flipping into cellulitis and getting very sick very quickly. We will see patient back for reevaluation in 1 week here in the clinic. If anything worsens or changes patient will contact our office for additional recommendations. Electronic Signature(s) Signed: 08/01/2021 2:04:20 PM By: Worthy Keeler PA-C Entered By: Worthy Keeler on 08/01/2021 14:04:19 Hewett, [name] ([id number]) -------------------------------------------------------------------------------- ROS/PFSH Details Patient Name: [name] Date of Service: 08/01/2021 12:45 PM Medical Record Number: [id number] Patient Account Number: [id number] Date of Birth/Sex: 08-27-1955 (66 y.o. 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 Negative for: Aspiration; Chronic Obstructive Pulmonary Disease (COPD); Pneumothorax Gastrointestinal Complaints and Symptoms: Negative for: Frequent diarrhea; Nausea; Vomiting Medical History: Negative for: Cirrhosis ; Colitis; Crohnos; Hepatitis A;  Hepatitis B; Hepatitis C Endocrine Complaints and Symptoms: Negative for: Hepatitis; Thyroid disease; Polydypsia (Excessive Thirst) Medical History: Negative for: Type I Diabetes; Type II  Diabetes Genitourinary Complaints and Symptoms: Positive for: Incontinence/dribbling - urine Medical History: Negative for: End Stage Renal Disease Immunological Complaints and Symptoms: Negative for: Hives; Itching Medical History: Negative for: Lupus Erythematosus; Raynaudos; Scleroderma Integumentary (Skin) Mcever, Marianny C. ([id number]) Complaints and Symptoms: Positive for: Wounds - lymphedema Review of System Notes: hx cellulitis Medical History: Negative for: History of Burn; History of pressure wounds Musculoskeletal Complaints and Symptoms: Positive for: Muscle Pain; Muscle Weakness Medical History: Positive for: Gout; Osteoarthritis Negative for: Rheumatoid Arthritis; Osteomyelitis Neurologic Complaints and Symptoms: Negative for: Numbness/parasthesias; Focal/Weakness Medical History: Negative for: Dementia; Neuropathy; Quadriplegia; Paraplegia; Seizure Disorder Psychiatric Complaints and Symptoms: Negative for: Anxiety; Claustrophobia Medical History: Negative for: Anorexia/bulimia; Confinement Anxiety Ear/Nose/Mouth/Throat Medical History: Positive for: Chronic sinus problems/congestion Negative for: Middle ear problems Hematologic/Lymphatic Medical History: Positive for: Anemia; Lymphedema Negative for: Hemophilia; Human Immunodeficiency Virus; Sickle Cell Disease Cardiovascular Medical History: Positive for: Angina; Hypertension Negative for: Arrhythmia; Congestive Heart Failure; Coronary Artery Disease; Deep Vein Thrombosis; Hypotension; Myocardial Infarction; Peripheral Arterial Disease; Peripheral Venous Disease; Phlebitis; Vasculitis Oncologic Medical History: Negative for: Received Chemotherapy; Received Radiation HBO Extended History Items Eyes:  Ear/Nose/Mouth/Throat: Cataracts Chronic sinus problems/congestion Immunizations Pneumococcal Vaccine: Received Pneumococcal Vaccination: Yes Received Pneumococcal Vaccination On or After 60th Birthday: Yes Implantable Devices None ADDALYNNE, GOLDING ([id number]) Hospitalization / Surgery History Type of Hospitalization/Surgery [facility] Family and Social History Cancer: Yes - Father; Diabetes: Yes - Father; Heart Disease: Yes - Mother; Hereditary Spherocytosis: No; Hypertension: Yes - Mother,Father; Kidney Disease: No; Lung Disease: No; Seizures: Yes - Mother; Stroke: Yes - Mother; Thyroid Problems: Yes - Mother,Siblings; Tuberculosis: No; Former smoker - uses snuff - ended on 07/09/1990; Marital Status - Single; Alcohol Use: Never; Drug Use: No History; Caffeine Use: Moderate - coffee; Financial Concerns: No; Food, Clothing or Shelter Needs: No; Support System Lacking: No; Transportation Concerns: No Electronic Signature(s) Signed: 08/01/2021 4:34:33 PM By: [name] Dredge Signed: 08/01/2021 6:21:10 PM By: Worthy Keeler PA-C Entered By: [name] Dredge on 08/01/2021 13:15:16 Hinnenkamp, [name] ([id number]) -------------------------------------------------------------------------------- SuperBill Details Patient Name: [name]. Date of Service: 08/01/2021 Medical Record Number: [id number] Patient Account Number: [id number] Date of Birth/Sex: Sep 30, 1955 (66 y.o. F) Treating RN: [name] Dredge Primary Care Provider: Ranae Plumber Other Clinician: Referring Provider: Ranae Plumber Treating Provider/Extender: Skipper Cliche in Treatment: 0 Diagnosis Coding ICD-10 Codes Code Description Q82.0 Hereditary lymphedema L97.811 Non-pressure chronic ulcer of other part of right lower leg limited to breakdown of skin L97.821 Non-pressure chronic ulcer of other part of left lower leg limited to breakdown of skin Facility Procedures CPT4 Code: [id number] Description: [phone] - WOUND CARE  VISIT-LEV 2 NEW PT Modifier: Quantity: 1 Physician Procedures CPT4 Code: [id number] Description: [id number] - WC PHYS LEVEL 4 - EST PT Modifier: Quantity: 1 CPT4 Code: Description: ICD-10 Diagnosis Description Q82.0 Hereditary lymphedema L97.811 Non-pressure chronic ulcer of other part of right lower leg limited to bre L97.821 Non-pressure chronic ulcer of other part of left lower leg limited to brea Modifier: akdown of skin kdown of skin Quantity: Electronic Signature(s) Signed: 08/01/2021 3:32:39 PM By: [name] Dredge Signed: 08/01/2021 6:21:10 PM By: Worthy Keeler PA-C Previous Signature: 08/01/2021 2:04:43 PM Version By: Worthy Keeler PA-C Entered By: [name] Dredge on 08/01/2021 15:32:39

## 2021-08-08 ENCOUNTER — Encounter: Payer: Medicare HMO | Admitting: Physician Assistant

## 2021-08-08 ENCOUNTER — Other Ambulatory Visit: Payer: Self-pay

## 2021-08-08 DIAGNOSIS — E11621 Type 2 diabetes mellitus with foot ulcer: Secondary | ICD-10-CM | POA: Diagnosis not present

## 2021-08-08 NOTE — Progress Notes (Addendum)
CHAIRTY, TOMAN ([id number]) Visit Report for 08/08/2021 Arrival Information Details Patient Name: [name], [name]. Date of Service: 08/08/2021 3:00 PM Medical Record Number: [id number] Patient Account Number: [id number] Date of Birth/Sex: 01/13/1956 (66 y.o. F) Treating RN: [name] Dredge Primary Care Kae Lauman: Ranae Plumber Other Clinician: Referring Makensie Mulhall: Ranae Plumber Treating Lasean Rahming/Extender: Skipper Cliche in Treatment: 1 Visit Information History Since Last Visit Added or deleted any medications: No Patient Arrived: [name] Any new allergies or adverse reactions: No Arrival Time: 15:07 Had a fall or experienced change in No Accompanied By: self activities of daily living that may affect Transfer Assistance: None risk of falls: Patient Identification Verified: Yes Hospitalized since last visit: No Secondary Verification Process Completed: Yes Has Dressing in Place as Prescribed: Yes Has Compression in Place as Prescribed: Yes Pain Present Now: Yes Electronic Signature(s) Signed: 08/08/2021 4:42:42 PM By: [name] Dredge Entered By: [name] Dredge on 08/08/2021 15:10:28 Meals, [name] ([id number]) -------------------------------------------------------------------------------- Clinic Level of Care Assessment Details Patient Name: [name]. Date of Service: 08/08/2021 3:00 PM Medical Record Number: [id number] Patient Account Number: [id number] Date of Birth/Sex: 03/20/1956 (66 y.o. F) Treating RN: [name] Dredge Primary Care Janin Kozlowski: Ranae Plumber Other Clinician: Referring Kenshin Splawn: Ranae Plumber Treating Alantis Bethune/Extender: Skipper Cliche in Treatment: 1 Clinic Level of Care Assessment Items TOOL 4 Quantity Score []  - Use when only an EandM is performed on FOLLOW-UP visit 0 ASSESSMENTS - Nursing Assessment / Reassessment []  - Reassessment of Co-morbidities (includes updates in patient status) 0 []  - 0 Reassessment of Adherence to  Treatment Plan ASSESSMENTS - Wound and Skin Assessment / Reassessment []  - Simple Wound Assessment / Reassessment - one wound 0 X- 2 5 Complex Wound Assessment / Reassessment - multiple wounds []  - 0 Dermatologic / Skin Assessment (not related to wound area) ASSESSMENTS - Focused Assessment []  - Circumferential Edema Measurements - multi extremities 0 []  - 0 Nutritional Assessment / Counseling / Intervention []  - 0 Lower Extremity Assessment (monofilament, tuning fork, pulses) []  - 0 Peripheral Arterial Disease Assessment (using hand held doppler) ASSESSMENTS - Ostomy and/or Continence Assessment and Care []  - Incontinence Assessment and Management 0 []  - 0 Ostomy Care Assessment and Management (repouching, etc.) PROCESS - Coordination of Care X - Simple Patient / Family Education for ongoing care 1 15 []  - 0 Complex (extensive) Patient / Family Education for ongoing care []  - 0 Staff obtains Programmer, systems, Records, Test Results / Process Orders []  - 0 Staff telephones HHA, Nursing Homes / Clarify orders / etc []  - 0 Routine Transfer to another Facility (non-emergent condition) []  - 0 Routine Hospital Admission (non-emergent condition) []  - 0 New Admissions / Biomedical engineer / Ordering NPWT, Apligraf, etc. []  - 0 Emergency Hospital Admission (emergent condition) X- 1 10 Simple Discharge Coordination []  - 0 Complex (extensive) Discharge Coordination PROCESS - Special Needs []  - Pediatric / Minor Patient Management 0 []  - 0 Isolation Patient Management []  - 0 Hearing / Language / Visual special needs []  - 0 Assessment of Community assistance (transportation, D/C planning, etc.) []  - 0 Additional assistance / Altered mentation []  - 0 Support Surface(s) Assessment (bed, cushion, seat, etc.) INTERVENTIONS - Wound Cleansing / Measurement Citron, Sailor C. ([id number]) []  - 0 Simple Wound Cleansing - one wound X- 2 5 Complex Wound Cleansing - multiple wounds X- 1  5 Wound Imaging (photographs - any number of wounds) []  - 0 Wound Tracing (instead of photographs) []  - 0 Simple Wound Measurement - one  wound X- 2 5 Complex Wound Measurement - multiple wounds INTERVENTIONS - Wound Dressings []  - Small Wound Dressing one or multiple wounds 0 X- 2 15 Medium Wound Dressing one or multiple wounds []  - 0 Large Wound Dressing one or multiple wounds []  - 0 Application of Medications - topical []  - 0 Application of Medications - injection INTERVENTIONS - Miscellaneous []  - External ear exam 0 []  - 0 Specimen Collection (cultures, biopsies, blood, body fluids, etc.) []  - 0 Specimen(s) / Culture(s) sent or taken to Lab for analysis []  - 0 Patient Transfer (multiple staff / Civil Service fast streamer / Similar devices) []  - 0 Simple Staple / Suture removal (25 or less) []  - 0 Complex Staple / Suture removal (26 or more) []  - 0 Hypo / Hyperglycemic Management (close monitor of Blood Glucose) []  - 0 Ankle / Brachial Index (ABI) - do not check if billed separately X- 1 5 Vital Signs Has the patient been seen at the hospital within the last three years: Yes Total Score: 95 Level Of Care: New/Established - Level 3 Electronic Signature(s) Signed: 08/08/2021 4:42:42 PM By: [name] Dredge Entered By: [name] Dredge on 08/08/2021 16:08:04 [name] ([id number]) -------------------------------------------------------------------------------- Encounter Discharge Information Details Patient Name: [name]. Date of Service: 08/08/2021 3:00 PM Medical Record Number: [id number] Patient Account Number: [id number] Date of Birth/Sex: 15-Dec-1955 (66 y.o. F) Treating RN: [name] Dredge Primary Care Jeanne Diefendorf: Ranae Plumber Other Clinician: Referring Lenvil Swaim: Ranae Plumber Treating Milagro Belmares/Extender: Skipper Cliche in Treatment: 1 Encounter Discharge Information Items Discharge Condition: Stable Ambulatory Status: Cane Discharge Destination:  Home Transportation: Other Accompanied By: self Schedule Follow-up Appointment: Yes Clinical Summary of Care: Electronic Signature(s) Signed: 08/08/2021 4:10:46 PM By: [name] Dredge Entered By: [name] Dredge on 08/08/2021 16:10:46 Crutcher, [name] ([id number]) -------------------------------------------------------------------------------- Lower Extremity Assessment Details Patient Name: [name]. Date of Service: 08/08/2021 3:00 PM Medical Record Number: [id number] Patient Account Number: [id number] Date of Birth/Sex: 1956-06-19 (66 y.o. F) Treating RN: [name] Dredge Primary Care Tigerlily Christine: Ranae Plumber Other Clinician: Referring Alysiah Suppa: Ranae Plumber Treating Jock Mahon/Extender: Skipper Cliche in Treatment: 1 Edema Assessment Assessed: [Left: No] [Right: No] [Left: Edema] [Right: :] Calf Left: Right: Point of Measurement: 39 cm From Medial Instep 92 cm 93 cm Ankle Left: Right: Point of Measurement: 9 cm From Medial Instep 51.5 cm 55 cm Vascular Assessment Pulses: Dorsalis Pedis Palpable: [Left:Yes] [Right:Yes] Electronic Signature(s) Signed: 08/08/2021 4:42:42 PM By: [name] Dredge Entered By: [name] Dredge on 08/08/2021 15:33:09 Tinkey, [name] ([id number]) -------------------------------------------------------------------------------- Multi Wound Chart Details Patient Name: [name]. Date of Service: 08/08/2021 3:00 PM Medical Record Number: [id number] Patient Account Number: [id number] Date of Birth/Sex: [birth year] (66 y.o. F) Treating RN: [name] Dredge Primary Care Yoni Lobos: Ranae Plumber Other Clinician: Referring Trenell Concannon: Ranae Plumber Treating Dameion Briles/Extender: Skipper Cliche in Treatment: 1 Vital Signs Height(in): 77 Pulse(bpm): 1 Weight(lbs): 948 Blood Pressure(mmHg): 149/71 Body Mass Index(BMI): 65.9 Temperature(F): 98.3 Respiratory Rate(breaths/min): 20 Photos: [N/A:N/A] Wound Location: Left, Lateral Lower  Leg Right Lower Leg N/A Wounding Event: Gradually Appeared Blister N/A Primary Etiology: Lymphedema Lymphedema N/A Comorbid History: Cataracts, Chronic sinus Cataracts, Chronic sinus N/A problems/congestion, Anemia, problems/congestion, Anemia, Lymphedema, Asthma, Sleep Lymphedema, Asthma, Sleep Apnea, Tuberculosis, Angina, Apnea, Tuberculosis, Angina, Hypertension, Gout, Osteoarthritis Hypertension, Gout, Osteoarthritis Date Acquired: 07/18/2021 07/31/2021 N/A Weeks of Treatment: 1 0 N/A Wound Status: Open Open N/A Wound Recurrence: No No N/A Clustered Wound: Yes Yes N/A Clustered Quantity: 2 2 N/A Measurements L x W x D (  cm) 4.5x23x0.1 1.5x10.5x0.1 N/A Area (cm) : 81.289 12.37 N/A Volume (cm) : 8.129 1.237 N/A % Reduction in Area: -165.40% N/A N/A % Reduction in Volume: -165.40% N/A N/A Classification: Partial Thickness Full Thickness Without Exposed N/A Support Structures Exudate Amount: Medium Medium N/A Exudate Type: Serosanguineous Serosanguineous N/A Exudate Color: red, brown red, brown N/A Granulation Amount: Medium (34-66%) Medium (34-66%) N/A Granulation Quality: Pink Pink, Pale N/A Necrotic Amount: Small (1-33%) Medium (34-66%) N/A Exposed Structures: Fat Layer (Subcutaneous Tissue): Fat Layer (Subcutaneous Tissue): N/A Yes Yes Epithelialization: None None N/A Treatment Notes Electronic Signature(s) Signed: 08/08/2021 4:42:42 PM By: [name] Dredge Entered By: [name] Dredge on 08/08/2021 15:38:43 Fischler, [name] ([id number]) -------------------------------------------------------------------------------- Multi-Disciplinary Care Plan Details Patient Name: CHAIA, IKARD. Date of Service: 08/08/2021 3:00 PM Medical Record Number: [id number] Patient Account Number: [id number] Date of Birth/Sex: [birth year] (66 y.o. F) Treating RN: [name] Dredge Primary Care Herny Scurlock: Ranae Plumber Other Clinician: Referring Jackson Fetters: Ranae Plumber Treating  Lakota Markgraf/Extender: Skipper Cliche in Treatment: 1 Active Inactive Orientation to the Wound Care Program Nursing Diagnoses: Knowledge deficit related to the wound healing center program Goals: Patient/caregiver will verbalize understanding of the [facility] Program Date Initiated: 08/01/2021 Target Resolution Date: 08/01/2021 Goal Status: Active Interventions: Provide education on orientation to the wound center Notes: Wound/Skin Impairment Nursing Diagnoses: Impaired tissue integrity Knowledge deficit related to ulceration/compromised skin integrity Goals: Ulcer/skin breakdown will have a volume reduction of 30% by week 4 Date Initiated: 08/01/2021 Target Resolution Date: 08/29/2021 Goal Status: Active Ulcer/skin breakdown will have a volume reduction of 50% by week 8 Date Initiated: 08/01/2021 Target Resolution Date: 09/26/2021 Goal Status: Active Ulcer/skin breakdown will have a volume reduction of 80% by week 12 Date Initiated: 08/01/2021 Target Resolution Date: 10/24/2021 Goal Status: Active Ulcer/skin breakdown will heal within 14 weeks Date Initiated: 08/01/2021 Target Resolution Date: 11/07/2021 Goal Status: Active Interventions: Assess patient/caregiver ability to obtain necessary supplies Assess patient/caregiver ability to perform ulcer/skin care regimen upon admission and as needed Assess ulceration(s) every visit Provide education on ulcer and skin care Notes: Electronic Signature(s) Signed: 08/08/2021 4:42:42 PM By: [name] Dredge Entered By: [name] Dredge on 08/08/2021 [place], [name] ([id number]) -------------------------------------------------------------------------------- Pain Assessment Details Patient Name: [name]. Date of Service: 08/08/2021 3:00 PM Medical Record Number: [id number] Patient Account Number: [id number] Date of Birth/Sex: [birth year] (66 y.o. F) Treating RN: [name] Dredge Primary Care Denver Bentson: Ranae Plumber Other Clinician: Referring Leather Estis: Ranae Plumber Treating Taline Nass/Extender: Skipper Cliche in Treatment: 1 Active Problems Location of Pain Severity and Description of Pain Patient Has Paino Yes Site Locations Rate the pain. Current Pain Level: 1 Pain Management and Medication Current Pain Management: Notes pt states pain to left leg Electronic Signature(s) Signed: 08/08/2021 4:42:42 PM By: [name] Dredge Entered By: [name] Dredge on 08/08/2021 15:10:53 Hardigree, [name] ([id number]) -------------------------------------------------------------------------------- Patient/Caregiver Education Details Patient Name: [name]. Date of Service: 08/08/2021 3:00 PM Medical Record Number: [id number] Patient Account Number: [id number] Date of Birth/Gender: 1955/11/12 (65 y.o. F) Treating RN: [name] Dredge Primary Care Physician: Ranae Plumber Other Clinician: Referring Physician: Ranae Plumber Treating Physician/Extender: Skipper Cliche in Treatment: 1 Education Assessment Education Provided To: Patient Education Topics Provided Welcome To [facility]: Handouts: [facility] Methods: Explain/Verbal Responses: State content correctly Wound/Skin Impairment: Handouts: Caring for Your Ulcer Methods: Explain/Verbal Responses: State content correctly Electronic Signature(s) Signed: 08/08/2021 4:42:42 PM By: [name] Dredge Entered By: [name] Dredge on 08/08/2021 16:08:41 Steelman,  Wylene Loletha Grayer ([id number]) -------------------------------------------------------------------------------- Wound Assessment Details Patient Name: CARLESHA, SEIPLE. Date of Service: 08/08/2021 3:00 PM Medical Record Number: [id number] Patient Account Number: [id number] Date of Birth/Sex: Mar 07, 1956 (66 y.o. F) Treating RN: [name] Dredge Primary Care Amy Gothard: Ranae Plumber Other Clinician: Referring Varnell Donate: Ranae Plumber Treating  Suliman Termini/Extender: Skipper Cliche in Treatment: 1 Wound Status Wound Number: 8 Primary Lymphedema Etiology: Wound Location: Left, Lateral Lower Leg Wound Open Wounding Event: Gradually Appeared Status: Date Acquired: 07/18/2021 Comorbid Cataracts, Chronic sinus problems/congestion, Anemia, Weeks Of Treatment: 1 History: Lymphedema, Asthma, Sleep Apnea, Tuberculosis, Angina, Clustered Wound: Yes Hypertension, Gout, Osteoarthritis Photos Wound Measurements Length: (cm) 4.5 Width: (cm) 23 Depth: (cm) 0.1 Clustered Quantity: 2 Area: (cm) 81.28 Volume: (cm) 8.129 % Reduction in Area: -165.4% % Reduction in Volume: -165.4% Epithelialization: None 9 Wound Description Classification: Partial Thickness Exudate Amount: Medium Exudate Type: Serosanguineous Exudate Color: red, brown Foul Odor After Cleansing: No Slough/Fibrino No Wound Bed Granulation Amount: Medium (34-66%) Exposed Structure Granulation Quality: Pink Fat Layer (Subcutaneous Tissue) Exposed: Yes Necrotic Amount: Small (1-33%) Necrotic Quality: Adherent Slough Treatment Notes Wound #8 (Lower Leg) Wound Laterality: Left, Lateral Cleanser Peri-Wound Care Topical Primary Dressing Silvercel 4 1/4x 4 1/4 (in/in) Gadison, Joseph C. ([id number]) Discharge Instruction: Apply Silvercel 4 1/4x 4 1/4 (in/in) as instructed Secondary Dressing ABD Pad 5x9 (in/in) Discharge Instruction: Cover with ABD pad Secured With 74M Medipore H Soft Cloth Surgical Tape, 2x2 (in/yd) ACE elastic bandage 6 in Compression Wrap Compression Stockings Add-Ons Electronic Signature(s) Signed: 08/08/2021 4:42:42 PM By: [name] Dredge Entered By: [name] Dredge on 08/08/2021 15:30:34 Akhter, [name] ([id number]) -------------------------------------------------------------------------------- Wound Assessment Details Patient Name: [name] Date of Service: 08/08/2021 3:00 PM Medical Record Number: [id number] Patient Account  Number: [id number] Date of Birth/Sex: 02/25/1956 (66 y.o. F) Treating RN: [name] Dredge Primary Care Torra Pala: Ranae Plumber Other Clinician: Referring Ladarius Seubert: Ranae Plumber Treating Nini Cavan/Extender: Skipper Cliche in Treatment: 1 Wound Status Wound Number: 9 Primary Lymphedema Etiology: Wound Location: Right Lower Leg Wound Open Wounding Event: Blister Status: Date Acquired: 07/31/2021 Comorbid Cataracts, Chronic sinus problems/congestion, Anemia, Weeks Of Treatment: 0 History: Lymphedema, Asthma, Sleep Apnea, Tuberculosis, Angina, Clustered Wound: Yes Hypertension, Gout, Osteoarthritis Photos Wound Measurements Length: (cm) 1.5 Width: (cm) 10.5 Depth: (cm) 0.1 Clustered Quantity: 2 Area: (cm) 12.37 Volume: (cm) 1.237 % Reduction in Area: % Reduction in Volume: Epithelialization: None Tunneling: No Undermining: No Wound Description Classification: Full Thickness Without Exposed Support Structu Exudate Amount: Medium Exudate Type: Serosanguineous Exudate Color: red, brown res Foul Odor After Cleansing: No Slough/Fibrino Yes Wound Bed Granulation Amount: Medium (34-66%) Exposed Structure Granulation Quality: Pink, Pale Fat Layer (Subcutaneous Tissue) Exposed: Yes Necrotic Amount: Medium (34-66%) Necrotic Quality: Adherent Slough Treatment Notes Wound #9 (Lower Leg) Wound Laterality: Right Cleanser Peri-Wound Care Topical Primary Dressing Silvercel 4 1/4x 4 1/4 (in/in) Mcdaid, Rumaisa C. ([id number]) Discharge Instruction: Apply Silvercel 4 1/4x 4 1/4 (in/in) as instructed Secondary Dressing ABD Pad 5x9 (in/in) Discharge Instruction: Cover with ABD pad Secured With 74M ACE Elastic Bandage With VELCRO Brand Closure, 4 (in) 74M Medipore H Soft Cloth Surgical Tape, 2x2 (in/yd) Compression Wrap Compression Stockings Add-Ons Electronic Signature(s) Signed: 08/08/2021 4:42:42 PM By: [name] Dredge Entered By: [name] Dredge on 08/08/2021  15:28:03 Lukens, [name] ([id number]) -------------------------------------------------------------------------------- Vitals Details Patient Name: [name]. Date of Service: 08/08/2021 3:00 PM Medical Record Number: [id number] Patient Account Number: [id number] Date of Birth/Sex: 03/05/1956 (66 y.o. F) Treating RN: [name] Dredge Primary Care Quantina Dershem: Ranae Plumber  Other Clinician: Referring Gissel Keilman: Ranae Plumber Treating Awad Gladd/Extender: Skipper Cliche in Treatment: 1 Vital Signs Time Taken: 15:12 Temperature (F): 98.3 Height (in): 63 Pulse (bpm): 90 Weight (lbs): 372 Respiratory Rate (breaths/min): 20 Body Mass Index (BMI): 65.9 Blood Pressure (mmHg): 149/71 Reference Range: 80 - 120 mg / dl Electronic Signature(s) Signed: 08/08/2021 4:42:42 PM By: [name] Dredge Entered By: [name] Dredge on 08/08/2021 15:15:34

## 2021-08-08 NOTE — Progress Notes (Signed)
[name], [name] ([id number]) Visit Report for 08/08/2021 Chief Complaint Document Details Patient Name: Elizabeth, [name]. Date of Service: 08/08/2021 3:00 PM Medical Record Number: [id number] Patient Account Number: [id number] Date of Birth/Sex: [birth year] (66 y.o. F) Treating RN: [name] Dredge Primary Care Provider: Ranae Plumber Other Clinician: Referring Provider: Ranae Plumber Treating Provider/Extender: Skipper Cliche in Treatment: 1 Information Obtained from: Patient Chief Complaint Bilateral LE lymphedema with Left LE Ulcers Electronic Signature(s) Signed: 08/08/2021 2:55:28 PM By: Worthy Keeler PA-C Entered By: Worthy Keeler on 08/08/2021 14:55:28 [name] ([id number]) -------------------------------------------------------------------------------- HPI Details Patient Name: [name] Date of Service: 08/08/2021 3:00 PM Medical Record Number: [id number] Patient Account Number: [id number] Date of Birth/Sex: 1955/10/24 (66 y.o. F) Treating RN: [name] Dredge Primary Care Provider: Ranae Plumber Other Clinician: Referring Provider: Ranae Plumber Treating Provider/Extender: Skipper Cliche in Treatment: 1 History of Present Illness HPI Description: The patient is a 66 year old female with history of hypertension and a long-standing history of bilateral lower extremity lymphedema (first presented on 4/2) . She has had open ulcers in the past which have always responded to compression therapy. She had briefly been to a lymphedema clinic in the past which helped her at the time. this time around she stopped treatment of her lymphedema pumps approximately 2 weeks ago because of some pain in the knees and then noticed the right leg getting worse. She was seen by her PCP who put her on clindamycin 4 times a day 2 days ago. The patient has seen [facility] and Dr. Delana Meyer had seen her last year where a vascular study including venous and arterial duplex studies  were within normal limits. he had recommended compression stockings and lymphedema pumps and the patient has been using this in about 2 weeks ago. She is known to be diabetic but in the past few time she's gone to her primary care doctor her hemoglobin A1c has been normal. 02/11/2015 - after her last visit she took my advice and went to the ER regarding the progressive cellulitis of her right lower extremity and she was admitted between July 17 and 22nd. She received IV antibiotics and then was sent home on a course of steroid-induced and oral antibiotics. She has improved much since then. 02/17/2015 -- she has been doing fine and the weeping of her legs has remarkably gone down. She has no fresh issues. READMISSION 01/15/18 This patient was given this clinic before most recently in 2016 seen by Dr. Con Memos. She has massive bilateral lymphedema and over the last 2 months this had weeping edema out of the left leg. She has compression pumps but her compliance with these has been minimal. She has [facility] they've been using TCA/ABDs/kerlix under an Ace wrap.she has had recent problems with cellulitis. She was apparently seen in the ER and 12/23/17 and given clindamycin. She was then followed by her primary doctor and given doxycycline and Keflex. The pain seems to have settled down. In April 2018 the patient had arterial studies done at [facility]. This showed triphasic waveforms throughout the right leg and mostly triphasic waveforms on the left except for monophasic at the posterior tibial artery distally. She was not felt to have evidence of right lower extremity arterial stenosis or significant problems on the left side. 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 She also had a right lower extremity venous Doppler in January 2018 this was limited by the patient's body  habitus and lymphedema. Most of the proximal veins were not visualized The  patient presents with an area of denuded skin on the anterior medial part of the left calf. There is weeping edema fluid here. 01/22/18; the patient has somewhat better edema control using her compression pumps twice a day and as a result she has much better epithelialization on the left anterior calf area. Only a small open area remains. 01/29/18; the patient has been compliant with her compression pumps. Both the areas on her calf that healed. The remaining area on the left anterior leg is fully epithelialized Readmission: 02/20/2019 upon evaluation today patient presents for reevaluation due to issues that she is having with the bilateral lower extremities. She actually has wounds open on both legs. On the right she has an area in the crease of her leg on the right around the knee region which is actually draining quite a bit and actually has some fungal type appearance to it. She has been on nystatin powder that seems to have helped to some degree. In regard to the left lower extremity this is actually in the lower portion of her leg closer to the ankle and again is continuing to drain as well unfortunately. There does not appear to be any signs of active infection at this time which is good news. No fevers, chills, nausea, vomiting, or diarrhea. She tells me that since she was seen last year she is actually been doing quite well for the most part with regard to her lower extremities. Unfortunately she now is experiencing a little bit more drainage at this time. She is concerned about getting this under control so that it does not get significantly worse. 02/27/2019 on evaluation today patient appears to be doing somewhat better in regard to her bilateral lower extremity wounds. She has been tolerating the dressing changes without complication. Fortunately there is no signs of active infection at this point. No fevers, chills, nausea, vomiting, or diarrhea. She did get her dressing supplies which is  excellent news she was extremely excited to get these. She also got paperwork from [employer] for their financial assistance program where they may be able to help her out in the future if needed with supplies at discounted prices. 03/06/2019 on evaluation today patient appears to be doing a little worse with regard to both areas of weeping on her bilateral lower extremities. This is around the right medial knee and just above the left ankle. With that being said she is unfortunately not doing as well as I would like to see. I feel like she may need to potentially go see someone at the lymphedema clinic as the wraps that she needs or even beyond what we can do here at the wound care center. She really does not have wounds she just has open areas of weeping that are causing some difficulty for her. Subsequently because of this and the moisture I am concerned about the potential for infection I am going to likely give her a prophylactic antibiotic today, Keflex, just to be on the safe side. Nonetheless again there is no obvious signs of active infection at this time. 03/13/2019 on evaluation today patient appears to be doing well with regard to her bilateral lower extremities where she has been weeping compared to even last week's evaluation. I see some areas of new skin growth which is excellent and overall I am very pleased with how things seem to be progressing. No fevers, chills, nausea, vomiting, or diarrhea. Iden,  [name] ([id number]) 03/20/2019 on evaluation today patient unfortunately is continuing to have issues with significant edema of the left lower extremity. Her right side seems to be doing much better. Unfortunately her left side is showing increased weeping of the lower portion of her leg. This is quite unfortunate obviously we were hoping to get her into the lymphedema clinic they really do not seem to when I see her how if she is draining. Despite the fact this is really not wound related  but more lymphedema weeping related. Nonetheless I do not know that this can be helpful for her to even go for that appointment since again I am not sure there is much that they would actually do at this point. We may need to try a 4 layer compression wrap as best we can on her leg. She is on the Augmentin currently although I am still concerned about whether or not there could be potentially something going on infection wise I would obtain a culture though I understand is not the best being that is a surface culture I just 1 to make sure I do not seem to be missing anything. 03/27/2019 on evaluation today patient appears to be doing much better in regard to the left lower extremity compared to last week. Last week she had tremendous weeping which I think was subsequent to infection now she seems to be doing much better and very pleased. This is not completely healed but there is a lot of new skin growth and it has dried out quite a bit. Overall I think that we are doing well with how things are moving along at this time. No fevers, chills, nausea, vomiting, or diarrhea. 04/03/2019 evaluation today patient appears to be doing a little worse this week compared to last time I saw her. I think this may be due to the fact that she is having issues with not being able to sleep in her bed at least not until last night. She is therefore been in a lift chair and subsequently has also had issues with not been able to use her pumps since she could not get in bed. With that being said the patient overall seems to be doing okay I do think I may want extend the antibiotic for a little bit longer at least until we can see if her edema and her weeping gets better and if it is then obviously I can always discontinue the antibiotics as of next week however I want her to continue to have it over the next week. 04/10/2019 on evaluation today patient unfortunately is still doing poorly with regard to her left lower  extremity. Her right is all things considering doing fairly well. On the left however she continues to have spreading of the area of infection and weeping which appears to be even a larger surface area than noted last week. She did have a positive culture for Pseudomonas in particular which seems to have been of concern she still has green/yellow discharge consistent with Pseudomonas and subsequently a tremendous amount of it. This has me obviously still concerned about the infection not really clearing up despite the fact that on culture it appears the Cipro should have been a good option for treating this. I think she may at this point need IV antibiotics since things are not doing better I do not want to get worse and cause sepsis. She is in agreement with the plan and believes as well that she likely does need  to go to the hospital for IV vancomycin. Or something of the like depending on what the recommendation is from the ER. 04/17/2019 on evaluation today patient appears to be doing excellent in regard to her lower extremity on the left. She was in the hospital for several days from when I sent her last we saw her until just this past Tuesday. Fortunately her drainage is significantly improved and in fact is mostly clear. There is just a couple small areas that may still drain a little bit she states that the [facility] they prescribed for her at discharge she went picked up from pharmacy and got home but has not been able to find it since. She is looked everywhere. She is wondering if I will replace that for her today I will be more than happy to do that. 05/01/2019 on evaluation today patient actually appears to be doing quite well with regard to her lower extremities. She occasionally is having areas that will leak and then heal up mainly when a piece of the fibrotic skin pops off but fortunately she is not having any signs of active infection at this time. Overall she also really does not have  any obvious weeping at this time. I do believe however she really needs some compression wraps and I think this may be a good time to get her back to the lymphedema clinic. 05/11/2019 on evaluation today patient actually appears to be doing quite well with regard to her bilateral lower extremities. She occasionally will have a small area that we per another but in general seems to be completely healed which is great news. Overall very pleased with how everything seems to be progressing. She does have her appointment with lymphedema clinic on November 18. 05/25/2019 on evaluation today patient appears to be doing well with regard to her left lower extremity. I am very pleased in this regard. In regard to her right leg this actually did start draining more I think it is mainly due to the fact that her leg is more swollen. I am not seeing any obvious signs of infection at this time although that is definitely something were obviously acutely aware of simply due to the fact that she had an issue not too far back with exactly this issue. Nonetheless I do feel like that lymphedema clinic would still be beneficial for her. I explained obviously if they are not able to do anything treatment wise on the right leg we could at least have them treat her left leg and then proceed from there. The patient is really in agreement with that plan. If they are able to do both as the drainage slows down that I would be happy to let them handle both. 06/01/2019 on evaluation today patient unfortunately appears to be doing worse with regard to her right lower extremity. The left lower extremity is still maintaining at this point. Unfortunately she has been having significantly increased pain over the past several days and has been experiencing as well increased swelling of the right lower extremity. I really do not know that I am seeing anything that appears to be obvious for infection at this point to be peripherally  honest. With that being said the patient does seem to be having much more swelling that she is even experienced in the past and coupled with increased pain in her hip as well I am concerned that again she could potentially have a DVT although I am not 100% sure of this. I think  it something that may need to be checked out. We discussed the possibility of sending her for a DVT study through the hospital but unfortunately transportation is an issue if she does have a DVT I do not want her to wait days to be able to get in for that test however if she has this scheduled as an outpatient that is as fast that she will be able to get the test scheduled for transportation purposes. That will also fall on Thanksgiving so subsequently she did actually be looking at either Friday or even next week before we would know anything back from this. That is much too long in my opinion. Subsequent to the amount of discomfort she is experiencing the patient is actually okay with going to the ER for evaluation today. 06/12/2019 on evaluation today patient actually appears to be doing significantly better compared to last time I saw her. Following when I last saw her she was actually in the hospital from that Monday until the following Sunday almost 1 full week. She actually was placed on Keflex in the hospital following the time for her to be discharged and Dr. Steva Ready has recommended 2 times a day dosing of the Keflex for the next year in order to help with more prophylactic/preventative measures with regard to her developing cellulitis. Overall I think this sounds like an excellent plan. The patient unfortunately is good to have trouble being treated at lymphedema clinic due to the fact that she really cannot get up on the bed that they have there. They also state that they cannot manage her as long as she has anything draining at this point. Obviously that is somewhat unfortunate as she does need help with edema  control but nonetheless we will have to do what we can for her outside of it sounds like the lymphedema clinic scenario at this point. 06/19/2019 on evaluation today patient appears to be doing fairly well with regard to her bilateral lower extremities. She is not nearly as swollen and shows no signs of infection at this point. There is no evidence of cellulitis whatsoever. She also has no open wounds or draining at this point which is also good news. No fever chills noted. She seems to be in very good spirits and in fact appears to be doing quite well. READMISSION 11/27/2019 [name], [name] ([id number]) This is a 66 year old woman that we have had in this clinic several times before including 2015, 16 and 19 and then most recently from 03/20/2019 through 06/19/2019 with bilateral lower extremity lymphedema. She has had previous arterial and reflux studies done years ago which were not all that remarkable. 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 She does have a positive family history and she had large legs starting may be in her 46s. She was recently in hospital from 10/20/2019 through 10/28/2019 with right leg cellulitis. She was given Ancef and clindamycin and then Zosyn when a culture showed Pseudomonas. At that time there was purulent drainage. She was followed by infectious disease Dr Steva Ready. The patient is now back at home. She has noted increased swelling in the right and no drainage in her right leg mostly on the posterior medial aspect in the calf area. She has not had pain or fever. She has literally been improved lysing above dressings because her at the area of this is far too large for standard compression. She has been wrapping the areas with sheets to resorptive  pads. She is found these helped somewhat. She does have an appointment with the lymphedema clinic in [place] in late June. Past medical history includes bilateral  lymphedema, hypertension, obstructive sleep apnea with CPAP. Recent hospitalization with apparently Pseudomonas cellulitis of the right lower leg 12/15/2019 upon evaluation today patient appears to be doing a little bit worse in regard to her right lower extremity. Unfortunately she is having more weeping down in the lower portion of her leg. Fortunately there is no signs of active infection at this time. No fever chills noted. The patient states she is not having increased pain except for when she attempted to use the lymphedema pumps unfortunately she states that she did have pain when she did this. Otherwise we been using absorptive dressings of one type or another she is using diapers at home and then subsequently Ace wraps. In regard to the barrier cream we have discussed the possibility of derma cloud which she would like to try I do not have a problem with that. 12/22/2019 upon evaluation today patient actually appears to be doing better in regard to her leg ulcers at this point. Fortunately there does not appear to be any signs of active infection which is great news and I am extremely pleased with where things are progressing at this time. There is no sign of active infection currently. The patient is very pleased to see things doing so well. 12/29/2019 upon evaluation today patient appears to be doing a little bit better in regard to her weeping in general over her lower extremities. She does have some signs of mild erythema little bit more than what I noted last week or rather last visit. Nonetheless I think that my threshold for switching her antibiotics from Keflex to something else is very low at this point considering that she has had such severe infections in the past that seem to come almost out of nowhere. There is a little erythema and warmth noted of the lower portion of her leg compared to the upper which also makes me want to go ahead and address things more rapidly at this point.  Likely I would switch out the Keflex for something like Levaquin ideally. 7/16; patient with severe bilateral lymphedema. She has superficial wounds albeit almost circumferential now on the left lateral lower leg. This may be new from last time. Small area on the right anterior lower leg and then another area on the right medial lower leg and of pannus fold. She has been using various absorptive garments. She states she is using her compression pumps once a day occasionally twice. Culture from her last visit here was negative 01/29/2020 on evaluation today patient appears to be doing excellent at this point in regard to her legs with regard to infection I see no signs of active infection at this point. She still does have unfortunately areas of weeping this is minimal on the right now her left is actually significantly worse although I do not think it is as bad as last week with Dr. Dellia Nims saw her. She has been trying to pump and elevate her legs is much as possible. She has previously been on the Keflex and in the past for prevention that seems to do fairly well and likely can extend that today. 02/04/2020 on evaluation today patient appears to be doing better in regard to her legs bilaterally. Fortunately there is no signs of active infection at this time which is great news and overall she has less weeping  on the left compared to the right and there is several spots where she is pretty much sealed up with no draining regions. Overall very pleased in this regard. 02/19/2020 on evaluation today patient appears to be doing very well in regard to her wounds currently. Fortunately there is no evidence of active infection overall very pleased with where things stand. She is significantly improved in regard to her edema I am extremely pleased in this regard she tells me that the popping no longer hurts and in fact she actually looks forward to it. 03/04/2020 on evaluation today patient appears to be doing  excellent in regard to her lower extremities. Fortunately there is no signs of active infection at this time. No fevers, chills, nausea, vomiting, or diarrhea. 03/25/2020 on evaluation today patient appears to be doing a little bit more poorly in regard to her legs at this point. She tells me that she is still continue to have issues with drainage and this has been a little bit worse she was getting ready to start taking the Keflex again but wanted to see me first. Fortunately there is no signs of active infection at this time. No fevers, chills, nausea, vomiting, or diarrhea. 04/15/2020 upon evaluation today patient appears to be doing somewhat poorly in regard to her right leg. She tells me she has been having more pain she has been taking the Keflex that was previously prescribed unfortunately that just does not seem to help with this. She was hoping that the pain on her right was actually coming from the fact that she was having issues with her wrap having gotten caught in her recliner. With that being said she tells me that she knew something was not right. Currently her right leg is warm to touch along with being erythematous all the way up to around at least mid thigh as far as I can see. The left leg does not appear to be doing that badly though there is increased weeping around the ankle region. 05/06/2020 on evaluation today patient appears to be doing much better than last time I saw her. She did go to the hospital where she was admitted for 2 days and treated with antibiotic therapy. She was discharged with antibiotics as well and has done extremely well. I am extremely pleased with where things stand today. There is no signs of active infection at this time which is great news. 05/27/2020 upon evaluation today patient appears to be doing well with regard to her lower extremities bilaterally. She has just a very tiny area on the right leg which is opening on the left leg she is significantly  improved though she still has several areas that do appear to be open this is minimal compared to what is been in the past. In general I am extremely pleased with where things stand today. The patient does tell me she is not been using her pumps quite as much as she should be. I do believe that is 1 area she can definitely work on. She has had a lot going on including a Covid exposure and apparently also a outbreak of likely shingles. 06/24/2020 upon evaluation today patient appears to be doing well with regard to her legs in general although the left leg unfortunately is showing some signs of erythema she does have a little bit of increased weeping and to be honest I am concerned about infection here. I discussed that with her today and I think that we may need to address  this sooner rather than later she has been taking Keflex she is not really certain that is been making a big improvement however. No fevers, chills, nausea, vomiting, or diarrhea. [name], [name] ([id number]) 06/30/2020 upon evaluation today patient's legs actually seem to be doing better in my opinion as compared to where they were last week. Fortunately there does not appear to be any signs of active infection. Her culture showed multiple organisms nothing predominate. With that being said the Levaquin seems to have done well I think she has improved since I last saw her as well. 07/14/2020 upon evaluation today patient appears to be doing actually better in regard to her lower extremities in my opinion. She has been tolerating the dressing changes without complication. Fortunately there is no signs of active infection at this time. 08/04/2020 upon evaluation today patient appears to be doing excellent in regard to her leg ulcers. Fortunately she has very little that is open at this point. This is great news. In fact I think that the Goldbond medicated powder has been excellent for her. It seems to have done the trick where we had  tried several other things without as much success. Fortunately there is no evidence of active infection at this time. No fevers, chills, nausea, vomiting, or diarrhea. 09/01/2020 upon evaluation today patient appears to be doing a little bit more poorly than the last time I saw her. She tells me right now that she has been having a lot of drainage compared to where things were previous which has unfortunately led to more irritation as well. She is concerned that this is leading to infection. Again based on what I am seeing today as well I am also concerned of the same to be honest. 09/15/2020 upon evaluation today patient appears to be doing well with regard to her legs compared to what I saw previous. Fortunately there does not appear to be any evidence of active infection at this time which is great news. At least not as badly as what it was previous. With that being said I do believe that the patient does need to have an extension of the antibiotics. This I believe will actually help her more in the way of making sure this stays under control and does not worsen. That was the Bactrim DS. Readmission: 08/01/2021 upon evaluation today patient appears to be doing actually pretty well all things considered. Is actually been almost a year since have seen her last March. Fortunately I do not see any evidence of active infection locally nor systemically at this point. She does have a couple open areas on the left leg the right leg appears to be doing quite well. In general she has been in the hospital 3 times since I saw her a year ago all 4 cellulitis type issues except for the last 1 which was actually more related to congestive heart failure for that reason she is not using her lymphedema pumps at this point and that is probably the best idea. 08/08/2021 upon evaluation today patient appears to be doing well with regard to her legs all things considered her left leg may be a little bit more swollen based  on what I see today. Fortunately I do not see any signs of active infection locally nor systemically at this time which is great news. No fevers, chills, nausea, vomiting, or diarrhea. Electronic Signature(s) Signed: 08/08/2021 4:35:56 PM By: Worthy Keeler PA-C Entered By: Worthy Keeler on 08/08/2021 16:35:55 Wiedeman, Cozette C. (  [id number]) -------------------------------------------------------------------------------- Physical Exam Details Patient Name: ALISAH, GRANDBERRY. Date of Service: 08/08/2021 3:00 PM Medical Record Number: [id number] Patient Account Number: [id number] Date of Birth/Sex: 1956/04/02 (66 y.o. F) Treating RN: [name] Dredge Primary Care Provider: Ranae Plumber Other Clinician: Referring Provider: Ranae Plumber Treating Provider/Extender: Skipper Cliche in Treatment: 1 Constitutional Obese and well-hydrated in no acute distress. Respiratory normal breathing without difficulty. Psychiatric this patient is able to make decisions and demonstrates good insight into disease process. Alert and Oriented x 3. pleasant and cooperative. Notes Upon inspection patient's wound bed showed signs of good granulation and epithelization at this point. Fortunately I do not see any evidence of infection systemically at this time which is great news. Nonetheless locally I am a little concerned that she may be developing some infection on the left in particular. I Minna see what I can do to try to have this off in the past she is done well with Bactrim and probably going prescribe that for her today. Electronic Signature(s) Signed: 08/08/2021 4:36:30 PM By: Worthy Keeler PA-C Entered By: Worthy Keeler on 08/08/2021 16:36:30 Hsu, [name] ([id number]) -------------------------------------------------------------------------------- Physician Orders Details Patient Name: [name] Date of Service: 08/08/2021 3:00 PM Medical Record Number: [id number] Patient Account  Number: [id number] Date of Birth/Sex: 22-Sep-1955 (66 y.o. 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 o Nurse Visit as needed Bathing/ Shower/ Hygiene o May shower; gently cleanse wound with antibacterial soap, rinse and pat dry prior to dressing wounds Wound Treatment Wound #8 - Lower Leg Wound Laterality: Left, Lateral Primary Dressing: Silvercel 4 1/4x 4 1/4 (in/in) (Generic) 1 x Per Day/30 Days Discharge Instructions: Apply Silvercel 4 1/4x 4 1/4 (in/in) as instructed Secondary Dressing: ABD Pad 5x9 (in/in) (Generic) 1 x Per Day/30 Days Discharge Instructions: Cover with ABD pad Secured With: 76M Medipore H Soft Cloth Surgical Tape, 2x2 (in/yd) (Generic) 1 x Per Day/30 Days Secured With: ACE elastic bandage 6 in (Generic) 1 x Per Day/30 Days Wound #9 - Lower Leg Wound Laterality: Right Primary Dressing: Silvercel 4 1/4x 4 1/4 (in/in) 1 x Per Day/30 Days Discharge Instructions: Apply Silvercel 4 1/4x 4 1/4 (in/in) as instructed Secondary Dressing: ABD Pad 5x9 (in/in) 1 x Per Day/30 Days Discharge Instructions: Cover with ABD pad Secured With: 76M ACE Elastic Bandage With VELCRO Brand Closure, 4 (in) 1 x Per Day/30 Days Secured With: 76M Medipore H Soft Cloth Surgical Tape, 2x2 (in/yd) 1 x Per Day/30 Days Patient Medications Allergies: ibuprofen, ACE Inhibitors, vancomycin Notifications Medication Indication Start End Bactrim DS 08/08/2021 DOSE 1 - oral 800 mg-160 mg tablet - 1 tablet oral  taken 2 time per day for 14 days Electronic Signature(s) Signed: 08/08/2021 4:37:35 PM By: Worthy Keeler PA-C Entered By: Worthy Keeler on 08/08/2021 16:37:35 Rihn, [name] ([id number]) -------------------------------------------------------------------------------- Problem List Details Patient Name: [name]. Date of Service: 08/08/2021 3:00 PM Medical Record Number: [id number] Patient Account Number: [id number] Date of Birth/Sex: Nov 11, 1955 (66 y.o. 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 Date of Service: 08/08/2021 3:00 PM Medical Record Number: [id number] Patient Account Number: [id number] Date of Birth/Sex: 1956-01-13 (66 y.o. F) Treating RN: [name] Dredge Primary Care Provider: Ranae Plumber Other Clinician: Referring Provider: Ranae Plumber Treating Provider/Extender: Skipper Cliche in Treatment: 1 Subjective Chief Complaint Information obtained from Patient Bilateral LE lymphedema with Left LE Ulcers History of Present Illness (HPI) The patient is a 66 year old female with history of hypertension and a long-standing history of bilateral  lower extremity lymphedema (first presented on 4/2) . She has had open ulcers in the past which have always responded to compression therapy. She had briefly been to a lymphedema clinic in the past which helped her at the time. this time around she stopped treatment of her lymphedema pumps approximately 2 weeks ago because of some pain in the knees and then noticed the right leg getting worse. She was seen by her PCP who put her on clindamycin 4 times a day 2 days ago. The patient has seen [facility] and Dr. Delana Meyer had seen her last year where a vascular study including venous and arterial duplex studies were within normal limits. he had recommended compression stockings and lymphedema pumps and the patient has been using this in about 2 weeks ago. She is known to be diabetic but in the past few time she's gone to her primary care doctor her hemoglobin A1c has been normal. 02/11/2015 - after her last visit she took my advice and went to the ER regarding the progressive cellulitis of her right lower extremity and she was admitted between July 17 and 22nd. She received IV antibiotics and then was sent home on a course of steroid-induced and oral antibiotics. She has improved much since then. 02/17/2015 -- she has been doing fine and the weeping of her legs has remarkably gone down. She has no fresh issues. READMISSION 01/15/18 This patient was given this clinic before most recently in 2016 seen by Dr. Con Memos. She has massive bilateral lymphedema and over the last 2 months this had weeping edema out of the left leg. She has compression pumps but her compliance with these has been minimal. She has [facility] they've been using TCA/ABDs/kerlix under an Ace wrap.she has had recent problems with cellulitis. She was apparently seen in the ER and 12/23/17 and given clindamycin. She was then followed by her primary doctor and given doxycycline and Keflex. The pain seems to have settled down. In April 2018  the patient had arterial studies done at [facility]. This showed triphasic waveforms throughout the right leg and mostly triphasic waveforms on the left except for monophasic at the posterior tibial artery distally. She was not felt to have evidence of right lower extremity arterial stenosis or significant problems on the left side. 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 She also had a right lower extremity venous Doppler in  January 2018 this was limited by the patient's body habitus and lymphedema. Most of the proximal veins were not visualized The patient presents with an area of denuded skin on the anterior medial part of the left calf. There is weeping edema fluid here. 01/22/18; the patient has somewhat better edema control using her compression pumps twice a day and as a result she has much better epithelialization on the left anterior calf area. Only a small open area remains. 01/29/18; the patient has been compliant with her compression pumps. Both the areas on her calf that healed. The remaining area on the left anterior leg is fully epithelialized Readmission: 02/20/2019 upon evaluation today patient presents for reevaluation due to issues that she is having with the bilateral lower extremities. She actually has wounds open on both legs. On the right she has an area in the crease of her leg on the right around the knee region which is actually draining quite a bit and actually has some fungal type appearance to it. She has been on nystatin powder that seems to have helped to some degree. In regard to the left lower extremity this is actually in the lower portion of her leg closer to the ankle and again is continuing to drain as well unfortunately. There does not appear to be any signs of active infection at this time which is good news. No fevers, chills, nausea, vomiting, or diarrhea. She tells me that since she was seen last year she is actually  been doing quite well for the most part with regard to her lower extremities. Unfortunately she now is experiencing a little bit more drainage at this time. She is concerned about getting this under control so that it does not get significantly worse. 02/27/2019 on evaluation today patient appears to be doing somewhat better in regard to her bilateral lower extremity wounds. She has been tolerating the dressing changes without complication. Fortunately there is no signs of active infection at this point. No fevers, chills, nausea, vomiting, or diarrhea. She did get her dressing supplies which is excellent news she was extremely excited to get these. She also got paperwork from [employer] for their financial assistance program where they may be able to help her out in the future if needed with supplies at discounted prices. 03/06/2019 on evaluation today patient appears to be doing a little worse with regard to both areas of weeping on her bilateral lower extremities. This is around the right medial knee and just above the left ankle. With that being said she is unfortunately not doing as well as I would like to see. I feel like she may need to potentially go see someone at the lymphedema clinic as the wraps that she needs or even beyond what we can do here at the wound care center. She really does not have wounds she just has open areas of weeping that are causing some difficulty for her. Subsequently because of this and the moisture I am concerned about the potential for infection I am going to likely give her a prophylactic antibiotic today, Keflex, just to be on the safe side. Nonetheless again there is no obvious signs of active infection at this time. [name], [name] ([id number]) 03/13/2019 on evaluation today patient appears to be doing well with regard to her bilateral lower extremities where she has been weeping compared to even last week's evaluation. I see some areas of new skin growth which is  excellent and overall I am very pleased with how  things seem to be progressing. No fevers, chills, nausea, vomiting, or diarrhea. 03/20/2019 on evaluation today patient unfortunately is continuing to have issues with significant edema of the left lower extremity. Her right side seems to be doing much better. Unfortunately her left side is showing increased weeping of the lower portion of her leg. This is quite unfortunate obviously we were hoping to get her into the lymphedema clinic they really do not seem to when I see her how if she is draining. Despite the fact this is really not wound related but more lymphedema weeping related. Nonetheless I do not know that this can be helpful for her to even go for that appointment since again I am not sure there is much that they would actually do at this point. We may need to try a 4 layer compression wrap as best we can on her leg. She is on the Augmentin currently although I am still concerned about whether or not there could be potentially something going on infection wise I would obtain a culture though I understand is not the best being that is a surface culture I just 1 to make sure I do not seem to be missing anything. 03/27/2019 on evaluation today patient appears to be doing much better in regard to the left lower extremity compared to last week. Last week she had tremendous weeping which I think was subsequent to infection now she seems to be doing much better and very pleased. This is not completely healed but there is a lot of new skin growth and it has dried out quite a bit. Overall I think that we are doing well with how things are moving along at this time. No fevers, chills, nausea, vomiting, or diarrhea. 04/03/2019 evaluation today patient appears to be doing a little worse this week compared to last time I saw her. I think this may be due to the fact that she is having issues with not being able to sleep in her bed at least not until last  night. She is therefore been in a lift chair and subsequently has also had issues with not been able to use her pumps since she could not get in bed. With that being said the patient overall seems to be doing okay I do think I may want extend the antibiotic for a little bit longer at least until we can see if her edema and her weeping gets better and if it is then obviously I can always discontinue the antibiotics as of next week however I want her to continue to have it over the next week. 04/10/2019 on evaluation today patient unfortunately is still doing poorly with regard to her left lower extremity. Her right is all things considering doing fairly well. On the left however she continues to have spreading of the area of infection and weeping which appears to be even a larger surface area than noted last week. She did have a positive culture for Pseudomonas in particular which seems to have been of concern she still has green/yellow discharge consistent with Pseudomonas and subsequently a tremendous amount of it. This has me obviously still concerned about the infection not really clearing up despite the fact that on culture it appears the Cipro should have been a good option for treating this. I think she may at this point need IV antibiotics since things are not doing better I do not want to get worse and cause sepsis. She is in agreement with the plan and  believes as well that she likely does need to go to the hospital for IV vancomycin. Or something of the like depending on what the recommendation is from the ER. 04/17/2019 on evaluation today patient appears to be doing excellent in regard to her lower extremity on the left. She was in the hospital for several days from when I sent her last we saw her until just this past Tuesday. Fortunately her drainage is significantly improved and in fact is mostly clear. There is just a couple small areas that may still drain a little bit she states that  the [facility] they prescribed for her at discharge she went picked up from pharmacy and got home but has not been able to find it since. She is looked everywhere. She is wondering if I will replace that for her today I will be more than happy to do that. 05/01/2019 on evaluation today patient actually appears to be doing quite well with regard to her lower extremities. She occasionally is having areas that will leak and then heal up mainly when a piece of the fibrotic skin pops off but fortunately she is not having any signs of active infection at this time. Overall she also really does not have any obvious weeping at this time. I do believe however she really needs some compression wraps and I think this may be a good time to get her back to the lymphedema clinic. 05/11/2019 on evaluation today patient actually appears to be doing quite well with regard to her bilateral lower extremities. She occasionally will have a small area that we per another but in general seems to be completely healed which is great news. Overall very pleased with how everything seems to be progressing. She does have her appointment with lymphedema clinic on November 18. 05/25/2019 on evaluation today patient appears to be doing well with regard to her left lower extremity. I am very pleased in this regard. In regard to her right leg this actually did start draining more I think it is mainly due to the fact that her leg is more swollen. I am not seeing any obvious signs of infection at this time although that is definitely something were obviously acutely aware of simply due to the fact that she had an issue not too far back with exactly this issue. Nonetheless I do feel like that lymphedema clinic would still be beneficial for her. I explained obviously if they are not able to do anything treatment wise on the right leg we could at least have them treat her left leg and then proceed from there. The patient is really in agreement  with that plan. If they are able to do both as the drainage slows down that I would be happy to let them handle both. 06/01/2019 on evaluation today patient unfortunately appears to be doing worse with regard to her right lower extremity. The left lower extremity is still maintaining at this point. Unfortunately she has been having significantly increased pain over the past several days and has been experiencing as well increased swelling of the right lower extremity. I really do not know that I am seeing anything that appears to be obvious for infection at this point to be peripherally honest. With that being said the patient does seem to be having much more swelling that she is even experienced in the past and coupled with increased pain in her hip as well I am concerned that again she could potentially have a DVT although I  am not 100% sure of this. I think it something that may need to be checked out. We discussed the possibility of sending her for a DVT study through the hospital but unfortunately transportation is an issue if she does have a DVT I do not want her to wait days to be able to get in for that test however if she has this scheduled as an outpatient that is as fast that she will be able to get the test scheduled for transportation purposes. That will also fall on Thanksgiving so subsequently she did actually be looking at either Friday or even next week before we would know anything back from this. That is much too long in my opinion. Subsequent to the amount of discomfort she is experiencing the patient is actually okay with going to the ER for evaluation today. 06/12/2019 on evaluation today patient actually appears to be doing significantly better compared to last time I saw her. Following when I last saw her she was actually in the hospital from that Monday until the following Sunday almost 1 full week. She actually was placed on Keflex in the hospital following the time for her to  be discharged and Dr. Steva Ready has recommended 2 times a day dosing of the Keflex for the next year in order to help with more prophylactic/preventative measures with regard to her developing cellulitis. Overall I think this sounds like an excellent plan. The patient unfortunately is good to have trouble being treated at lymphedema clinic due to the fact that she really cannot get up on the bed that they have there. They also state that they cannot manage her as long as she has anything draining at this point. Obviously that is somewhat unfortunate as she does need help with edema control but nonetheless we will have to do what we can for her outside of it sounds like the lymphedema clinic scenario at this point. 06/19/2019 on evaluation today patient appears to be doing fairly well with regard to her bilateral lower extremities. She is not nearly as swollen [name], [name] ([id number]) and shows no signs of infection at this point. There is no evidence of cellulitis whatsoever. She also has no open wounds or draining at this point which is also good news. No fever chills noted. She seems to be in very good spirits and in fact appears to be doing quite well. READMISSION 11/27/2019 This is a 66 year old woman that we have had in this clinic several times before including 2015, 16 and 19 and then most recently from 03/20/2019 through 06/19/2019 with bilateral lower extremity lymphedema. She has had previous arterial and reflux studies done years ago which were not all that remarkable. 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 She does have a positive family history and she had large legs starting may be in her 62s. She was recently in hospital from 10/20/2019 through 10/28/2019 with right leg cellulitis. She was given Ancef and clindamycin and then Zosyn when a culture showed Pseudomonas. At that time there was purulent drainage. She was followed  by infectious disease Dr Steva Ready. The patient is now back at home. She has noted increased swelling in the right and no drainage in her right leg mostly on the posterior medial aspect in the calf area. She has not had pain or fever. She has literally been improved lysing above dressings because her at the area of this is far too large for standard compression. She  has been wrapping the areas with sheets to resorptive pads. She is found these helped somewhat. She does have an appointment with the lymphedema clinic in [place] in late June. Past medical history includes bilateral lymphedema, hypertension, obstructive sleep apnea with CPAP. Recent hospitalization with apparently Pseudomonas cellulitis of the right lower leg 12/15/2019 upon evaluation today patient appears to be doing a little bit worse in regard to her right lower extremity. Unfortunately she is having more weeping down in the lower portion of her leg. Fortunately there is no signs of active infection at this time. No fever chills noted. The patient states she is not having increased pain except for when she attempted to use the lymphedema pumps unfortunately she states that she did have pain when she did this. Otherwise we been using absorptive dressings of one type or another she is using diapers at home and then subsequently Ace wraps. In regard to the barrier cream we have discussed the possibility of derma cloud which she would like to try I do not have a problem with that. 12/22/2019 upon evaluation today patient actually appears to be doing better in regard to her leg ulcers at this point. Fortunately there does not appear to be any signs of active infection which is great news and I am extremely pleased with where things are progressing at this time. There is no sign of active infection currently. The patient is very pleased to see things doing so well. 12/29/2019 upon evaluation today patient appears to be doing a little bit  better in regard to her weeping in general over her lower extremities. She does have some signs of mild erythema little bit more than what I noted last week or rather last visit. Nonetheless I think that my threshold for switching her antibiotics from Keflex to something else is very low at this point considering that she has had such severe infections in the past that seem to come almost out of nowhere. There is a little erythema and warmth noted of the lower portion of her leg compared to the upper which also makes me want to go ahead and address things more rapidly at this point. Likely I would switch out the Keflex for something like Levaquin ideally. 7/16; patient with severe bilateral lymphedema. She has superficial wounds albeit almost circumferential now on the left lateral lower leg. This may be new from last time. Small area on the right anterior lower leg and then another area on the right medial lower leg and of pannus fold. She has been using various absorptive garments. She states she is using her compression pumps once a day occasionally twice. Culture from her last visit here was negative 01/29/2020 on evaluation today patient appears to be doing excellent at this point in regard to her legs with regard to infection I see no signs of active infection at this point. She still does have unfortunately areas of weeping this is minimal on the right now her left is actually significantly worse although I do not think it is as bad as last week with Dr. Dellia Nims saw her. She has been trying to pump and elevate her legs is much as possible. She has previously been on the Keflex and in the past for prevention that seems to do fairly well and likely can extend that today. 02/04/2020 on evaluation today patient appears to be doing better in regard to her legs bilaterally. Fortunately there is no signs of active infection at this time which is  great news and overall she has less weeping on the left  compared to the right and there is several spots where she is pretty much sealed up with no draining regions. Overall very pleased in this regard. 02/19/2020 on evaluation today patient appears to be doing very well in regard to her wounds currently. Fortunately there is no evidence of active infection overall very pleased with where things stand. She is significantly improved in regard to her edema I am extremely pleased in this regard she tells me that the popping no longer hurts and in fact she actually looks forward to it. 03/04/2020 on evaluation today patient appears to be doing excellent in regard to her lower extremities. Fortunately there is no signs of active infection at this time. No fevers, chills, nausea, vomiting, or diarrhea. 03/25/2020 on evaluation today patient appears to be doing a little bit more poorly in regard to her legs at this point. She tells me that she is still continue to have issues with drainage and this has been a little bit worse she was getting ready to start taking the Keflex again but wanted to see me first. Fortunately there is no signs of active infection at this time. No fevers, chills, nausea, vomiting, or diarrhea. 04/15/2020 upon evaluation today patient appears to be doing somewhat poorly in regard to her right leg. She tells me she has been having more pain she has been taking the Keflex that was previously prescribed unfortunately that just does not seem to help with this. She was hoping that the pain on her right was actually coming from the fact that she was having issues with her wrap having gotten caught in her recliner. With that being said she tells me that she knew something was not right. Currently her right leg is warm to touch along with being erythematous all the way up to around at least mid thigh as far as I can see. The left leg does not appear to be doing that badly though there is increased weeping around the ankle region. 05/06/2020 on  evaluation today patient appears to be doing much better than last time I saw her. She did go to the hospital where she was admitted for 2 days and treated with antibiotic therapy. She was discharged with antibiotics as well and has done extremely well. I am extremely pleased with where things stand today. There is no signs of active infection at this time which is great news. 05/27/2020 upon evaluation today patient appears to be doing well with regard to her lower extremities bilaterally. She has just a very tiny area on the right leg which is opening on the left leg she is significantly improved though she still has several areas that do appear to be open this is minimal compared to what is been in the past. In general I am extremely pleased with where things stand today. The patient does tell me she is not been using her pumps quite as much as she should be. I do believe that is 1 area she can definitely work on. She has had a lot going on including a Covid exposure and apparently also a outbreak of likely shingles. [name], [name] ([id number]) 06/24/2020 upon evaluation today patient appears to be doing well with regard to her legs in general although the left leg unfortunately is showing some signs of erythema she does have a little bit of increased weeping and to be honest I am concerned about infection here. I discussed that  with her today and I think that we may need to address this sooner rather than later she has been taking Keflex she is not really certain that is been making a big improvement however. No fevers, chills, nausea, vomiting, or diarrhea. 06/30/2020 upon evaluation today patient's legs actually seem to be doing better in my opinion as compared to where they were last week. Fortunately there does not appear to be any signs of active infection. Her culture showed multiple organisms nothing predominate. With that being said the Levaquin seems to have done well I think she has  improved since I last saw her as well. 07/14/2020 upon evaluation today patient appears to be doing actually better in regard to her lower extremities in my opinion. She has been tolerating the dressing changes without complication. Fortunately there is no signs of active infection at this time. 08/04/2020 upon evaluation today patient appears to be doing excellent in regard to her leg ulcers. Fortunately she has very little that is open at this point. This is great news. In fact I think that the Goldbond medicated powder has been excellent for her. It seems to have done the trick where we had tried several other things without as much success. Fortunately there is no evidence of active infection at this time. No fevers, chills, nausea, vomiting, or diarrhea. 09/01/2020 upon evaluation today patient appears to be doing a little bit more poorly than the last time I saw her. She tells me right now that she has been having a lot of drainage compared to where things were previous which has unfortunately led to more irritation as well. She is concerned that this is leading to infection. Again based on what I am seeing today as well I am also concerned of the same to be honest. 09/15/2020 upon evaluation today patient appears to be doing well with regard to her legs compared to what I saw previous. Fortunately there does not appear to be any evidence of active infection at this time which is great news. At least not as badly as what it was previous. With that being said I do believe that the patient does need to have an extension of the antibiotics. This I believe will actually help her more in the way of making sure this stays under control and does not worsen. That was the Bactrim DS. Readmission: 08/01/2021 upon evaluation today patient appears to be doing actually pretty well all things considered. Is actually been almost a year since have seen her last March. Fortunately I do not see any evidence of active  infection locally nor systemically at this point. She does have a couple open areas on the left leg the right leg appears to be doing quite well. In general she has been in the hospital 3 times since I saw her a year ago all 4 cellulitis type issues except for the last 1 which was actually more related to congestive heart failure for that reason she is not using her lymphedema pumps at this point and that is probably the best idea. 08/08/2021 upon evaluation today patient appears to be doing well with regard to her legs all things considered her left leg may be a little bit more swollen based on what I see today. Fortunately I do not see any signs of active infection locally nor systemically at this time which is great news. No fevers, chills, nausea, vomiting, or diarrhea. Objective Constitutional Obese and well-hydrated in no acute distress. Vitals Time Taken: 3:12 PM,  Height: 63 in, Weight: 372 lbs, BMI: 65.9, Temperature: 98.3 F, Pulse: 90 bpm, Respiratory Rate: 20 breaths/min, Blood Pressure: 149/71 mmHg. Respiratory normal breathing without difficulty. Psychiatric this patient is able to make decisions and demonstrates good insight into disease process. Alert and Oriented x 3. pleasant and cooperative. General Notes: Upon inspection patient's wound bed showed signs of good granulation and epithelization at this point. Fortunately I do not see any evidence of infection systemically at this time which is great news. Nonetheless locally I am a little concerned that she may be developing some infection on the left in particular. I Minna see what I can do to try to have this off in the past she is done well with Bactrim and probably going prescribe that for her today. Integumentary (Hair, Skin) Wound #8 status is Open. Original cause of wound was Gradually Appeared. The date acquired was: 07/18/2021. The wound has been in treatment 1 weeks. The wound is located on the Left,Lateral Lower Leg.  The wound measures 4.5cm length x 23cm width x 0.1cm depth; 81.289cm^2 area and 8.129cm^3 volume. There is Fat Layer (Subcutaneous Tissue) exposed. There is a medium amount of serosanguineous drainage noted. There is medium (34-66%) pink granulation within the wound bed. There is a small (1-33%) amount of necrotic tissue within the wound bed including Adherent Slough. Wound #9 status is Open. Original cause of wound was Blister. The date acquired was: 07/31/2021. The wound is located on the Right Lower Leg. The wound measures 1.5cm length x 10.5cm width x 0.1cm depth; 12.37cm^2 area and 1.237cm^3 volume. There is Fat Layer (Subcutaneous Tissue) exposed. There is no tunneling or undermining noted. There is a medium amount of serosanguineous drainage noted. There is medium (34-66%) pink, pale granulation within the wound bed. There is a medium (34-66%) amount of necrotic tissue within the wound bed including [name], MURATORE C. ([id number]) [place]. 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 Nurse Visit as needed Bathing/ Shower/ Hygiene: May shower; gently cleanse wound with antibacterial soap, rinse and pat dry prior to dressing wounds The following medication(s) was prescribed: Bactrim DS oral 800 mg-160 mg tablet 1 1 tablet oral taken 2 time per day for 14 days starting 08/08/2021 WOUND #8: - Lower Leg Wound Laterality: Left, Lateral Primary Dressing: Silvercel 4 1/4x 4 1/4 (in/in) (Generic) 1 x Per Day/30 Days Discharge Instructions: Apply Silvercel 4 1/4x 4 1/4 (in/in) as instructed Secondary Dressing: ABD Pad 5x9 (in/in) (Generic) 1 x Per Day/30 Days Discharge Instructions: Cover with ABD pad Secured With: 59M Medipore H Soft Cloth  Surgical Tape, 2x2 (in/yd) (Generic) 1 x Per Day/30 Days Secured With: ACE elastic bandage 6 in (Generic) 1 x Per Day/30 Days WOUND #9: - Lower Leg Wound Laterality: Right Primary Dressing: Silvercel 4 1/4x 4 1/4 (in/in) 1 x Per Day/30 Days Discharge Instructions: Apply Silvercel 4 1/4x 4 1/4 (in/in) as instructed Secondary Dressing: ABD Pad 5x9 (in/in) 1 x Per Day/30 Days Discharge Instructions: Cover with ABD pad Secured With: 59M ACE Elastic Bandage With VELCRO Brand Closure, 4 (in) 1 x Per Day/30 Days Secured With: 59M Medipore H Soft Cloth Surgical Tape, 2x2 (in/yd) 1 x Per Day/30 Days 1. We will go ahead and send in a prescription for the antibiotic for her. This is to  be Bactrim DS which I think should do a good job in general. In the past she is tolerated this well done excellent with it. 2. I am also can recommend that we have the patient continue to elevate her legs much as possible obviously I think she does do a lot for herself and is very proactive. This is good. 3. I am going to recommend that we continue with the silver alginate currently she seems to be doing well with this and overall has always done well with that in the past as well. We will see patient back for reevaluation in 1 week here in the clinic. If anything worsens or changes patient will contact our office for additional recommendations. Electronic Signature(s) Signed: 08/08/2021 4:37:54 PM By: Worthy Keeler PA-C Entered By: Worthy Keeler on 08/08/2021 16:37:54 Larkin, [name] ([id number]) -------------------------------------------------------------------------------- SuperBill Details Patient Name: [name] Date of Service: 08/08/2021 Medical Record Number: [id number] Patient Account Number: [id number] Date of Birth/Sex: [birth year] (66 y.o. F) Treating RN: [name] Dredge Primary Care Provider: Ranae Plumber Other Clinician: Referring Provider: Ranae Plumber Treating Provider/Extender: Skipper Cliche in Treatment: 1 Diagnosis Coding ICD-10 Codes Code Description Q82.0 Hereditary lymphedema L97.811 Non-pressure chronic ulcer of other part of right lower leg limited to breakdown of skin L97.821 Non-pressure chronic ulcer of other part of left lower leg limited to breakdown of skin Facility Procedures CPT4 Code: [id number] Description: 41583 - WOUND CARE VISIT-LEV 3 EST PT Modifier: Quantity: 1 Physician Procedures CPT4 Code: 0940768 Description: 08811 - WC PHYS LEVEL 4 - EST PT Modifier: Quantity: 1 CPT4 Code: Description: ICD-10 Diagnosis Description Q82.0 Hereditary lymphedema L97.811 Non-pressure chronic ulcer of other part of right lower leg limited to bre L97.821 Non-pressure chronic ulcer of other part of left lower leg limited to brea Modifier: akdown of skin kdown of skin Quantity: Electronic Signature(s) Signed: 08/08/2021 4:38:13 PM By: Worthy Keeler PA-C Previous Signature: 08/08/2021 4:08:15 PM Version By: [name] Dredge Entered By: Worthy Keeler on 08/08/2021 16:38:13

## 2021-08-15 ENCOUNTER — Encounter: Payer: Medicare HMO | Attending: Physician Assistant | Admitting: Physician Assistant

## 2021-08-15 ENCOUNTER — Other Ambulatory Visit: Payer: Self-pay

## 2021-08-15 DIAGNOSIS — L97811 Non-pressure chronic ulcer of other part of right lower leg limited to breakdown of skin: Secondary | ICD-10-CM | POA: Insufficient documentation

## 2021-08-15 DIAGNOSIS — I10 Essential (primary) hypertension: Secondary | ICD-10-CM | POA: Diagnosis not present

## 2021-08-15 DIAGNOSIS — Q820 Hereditary lymphedema: Secondary | ICD-10-CM | POA: Diagnosis not present

## 2021-08-15 DIAGNOSIS — L97821 Non-pressure chronic ulcer of other part of left lower leg limited to breakdown of skin: Secondary | ICD-10-CM | POA: Diagnosis not present

## 2021-08-15 NOTE — Progress Notes (Addendum)
[name] ([id number]) Visit Report for 08/15/2021 Chief Complaint Document Details Patient Name: [name], [name]. Date of Service: 08/15/2021 2:00 PM Medical Record Number: [id number] Patient Account Number: [id number] Date of Birth/Sex: 12-29-1955 (66 y.o. F) Treating RN: Cornell Barman Primary Care Provider: Ranae Plumber Other Clinician: Referring Provider: Ranae Plumber Treating Provider/Extender: Skipper Cliche in Treatment: 2 Information Obtained from: Patient Chief Complaint Bilateral LE lymphedema with Left LE Ulcers Electronic Signature(s) Signed: 08/15/2021 1:58:33 PM By: Worthy Keeler PA-C Entered By: Worthy Keeler on 08/15/2021 13:58:33 Cage, [name] ([id number]) -------------------------------------------------------------------------------- HPI Details Patient Name: [name] Date of Service: 08/15/2021 2:00 PM Medical Record Number: [id number] Patient Account Number: [id number] Date of Birth/Sex: [birth year] (66 y.o. F) Treating RN: Cornell Barman Primary Care Provider: Ranae Plumber Other Clinician: Referring Provider: Ranae Plumber Treating Provider/Extender: Skipper Cliche in Treatment: 2 History of Present Illness HPI Description: The patient is a 66 year old female with history of hypertension and a long-standing history of bilateral lower extremity lymphedema (first presented on 4/2) . She has had open ulcers in the past which have always responded to compression therapy. She had briefly been to a lymphedema clinic in the past which helped her at the time. this time around she stopped treatment of her lymphedema pumps approximately 2 weeks ago because of some pain in the knees and then noticed the right leg getting worse. She was seen by her PCP who put her on clindamycin 4 times a day 2 days ago. The patient has seen [facility] and Dr. Delana Meyer had seen her last year where a vascular study including venous and arterial duplex studies were  within normal limits. he had recommended compression stockings and lymphedema pumps and the patient has been using this in about 2 weeks ago. She is known to be diabetic but in the past few time she's gone to her primary care doctor her hemoglobin A1c has been normal. 02/11/2015 - after her last visit she took my advice and went to the ER regarding the progressive cellulitis of her right lower extremity and she was admitted between July 17 and 22nd. She received IV antibiotics and then was sent home on a course of steroid-induced and oral antibiotics. She has improved much since then. 02/17/2015 -- she has been doing fine and the weeping of her legs has remarkably gone down. She has no fresh issues. READMISSION 01/15/18 This patient was given this clinic before most recently in 2016 seen by Dr. Con Memos. She has massive bilateral lymphedema and over the last 2 months this had weeping edema out of the left leg. She has compression pumps but her compliance with these has been minimal. She has [facility] they've been using TCA/ABDs/kerlix under an Ace wrap.she has had recent problems with cellulitis. She was apparently seen in the ER and 12/23/17 and given clindamycin. She was then followed by her primary doctor and given doxycycline and Keflex. The pain seems to have settled down. In April 2018 the patient had arterial studies done at  [facility]. This showed triphasic waveforms throughout the right leg and mostly triphasic waveforms on the left except for monophasic at the posterior tibial artery distally. She was not felt to have evidence of right lower extremity arterial stenosis or significant problems on the left side. 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 She also had a right lower extremity venous Doppler in January 2018 this was limited by the patient's body  habitus and lymphedema. Most of the proximal veins were not visualized The patient  presents with an area of denuded skin on the anterior medial part of the left calf. There is weeping edema fluid here. 01/22/18; the patient has somewhat better edema control using her compression pumps twice a day and as a result she has much better epithelialization on the left anterior calf area. Only a small open area remains. 01/29/18; the patient has been compliant with her compression pumps. Both the areas on her calf that healed. The remaining area on the left anterior leg is fully epithelialized Readmission: 02/20/2019 upon evaluation today patient presents for reevaluation due to issues that she is having with the bilateral lower extremities. She actually has wounds open on both legs. On the right she has an area in the crease of her leg on the right around the knee region which is actually draining quite a bit and actually has some fungal type appearance to it. She has been on nystatin powder that seems to have helped to some degree. In regard to the left lower extremity this is actually in the lower portion of her leg closer to the ankle and again is continuing to drain as well unfortunately. There does not appear to be any signs of active infection at this time which is good news. No fevers, chills, nausea, vomiting, or diarrhea. She tells me that since she was seen last year she is actually been doing quite well for the most part with regard to her lower extremities. Unfortunately she now is experiencing a little bit more drainage at this time. She is concerned about getting this under control so that it does not get significantly worse. 02/27/2019 on evaluation today patient appears to be doing somewhat better in regard to her bilateral lower extremity wounds. She has been tolerating the dressing changes without complication. Fortunately there is no signs of active infection at this point. No fevers, chills, nausea, vomiting, or diarrhea. She did get her dressing supplies which is  excellent news she was extremely excited to get these. She also got paperwork from [employer] for their financial assistance program where they may be able to help her out in the future if needed with supplies at discounted prices. 03/06/2019 on evaluation today patient appears to be doing a little worse with regard to both areas of weeping on her bilateral lower extremities. This is around the right medial knee and just above the left ankle. With that being said she is unfortunately not doing as well as I would like to see. I feel like she may need to potentially go see someone at the lymphedema clinic as the wraps that she needs or even beyond what we can do here at the wound care center. She really does not have wounds she just has open areas of weeping that are causing some difficulty for her. Subsequently because of this and the moisture I am concerned about the potential for infection I am going to likely give her a prophylactic antibiotic today, Keflex, just to be on the safe side. Nonetheless again there is no obvious signs of active infection at this time. 03/13/2019 on evaluation today patient appears to be doing well with regard to her bilateral lower extremities where she has been weeping compared to even last week's evaluation. I see some areas of new skin growth which is excellent and overall I am very pleased with how things seem to be progressing. No fevers, chills, nausea, vomiting, or diarrhea. Rallis,  [name] ([id number]) 03/20/2019 on evaluation today patient unfortunately is continuing to have issues with significant edema of the left lower extremity. Her right side seems to be doing much better. Unfortunately her left side is showing increased weeping of the lower portion of her leg. This is quite unfortunate obviously we were hoping to get her into the lymphedema clinic they really do not seem to when I see her how if she is draining. Despite the fact this is really not wound related  but more lymphedema weeping related. Nonetheless I do not know that this can be helpful for her to even go for that appointment since again I am not sure there is much that they would actually do at this point. We may need to try a 4 layer compression wrap as best we can on her leg. She is on the Augmentin currently although I am still concerned about whether or not there could be potentially something going on infection wise I would obtain a culture though I understand is not the best being that is a surface culture I just 1 to make sure I do not seem to be missing anything. 03/27/2019 on evaluation today patient appears to be doing much better in regard to the left lower extremity compared to last week. Last week she had tremendous weeping which I think was subsequent to infection now she seems to be doing much better and very pleased. This is not completely healed but there is a lot of new skin growth and it has dried out quite a bit. Overall I think that we are doing well with how things are moving along at this time. No fevers, chills, nausea, vomiting, or diarrhea. 04/03/2019 evaluation today patient appears to be doing a little worse this week compared to last time I saw her. I think this may be due to the fact that she is having issues with not being able to sleep in her bed at least not until last night. She is therefore been in a lift chair and subsequently has also had issues with not been able to use her pumps since she could not get in bed. With that being said the patient overall seems to be doing okay I do think I may want extend the antibiotic for a little bit longer at least until we can see if her edema and her weeping gets better and if it is then obviously I can always discontinue the antibiotics as of next week however I want her to continue to have it over the next week. 04/10/2019 on evaluation today patient unfortunately is still doing poorly with regard to her left lower  extremity. Her right is all things considering doing fairly well. On the left however she continues to have spreading of the area of infection and weeping which appears to be even a larger surface area than noted last week. She did have a positive culture for Pseudomonas in particular which seems to have been of concern she still has green/yellow discharge consistent with Pseudomonas and subsequently a tremendous amount of it. This has me obviously still concerned about the infection not really clearing up despite the fact that on culture it appears the Cipro should have been a good option for treating this. I think she may at this point need IV antibiotics since things are not doing better I do not want to get worse and cause sepsis. She is in agreement with the plan and believes as well that she likely does need  to go to the hospital for IV vancomycin. Or something of the like depending on what the recommendation is from the ER. 04/17/2019 on evaluation today patient appears to be doing excellent in regard to her lower extremity on the left. She was in the hospital for several days from when I sent her last we saw her until just this past Tuesday. Fortunately her drainage is significantly improved and in fact is mostly clear. There is just a couple small areas that may still drain a little bit she states that the [facility] they prescribed for her at discharge she went picked up from pharmacy and got home but has not been able to find it since. She is looked everywhere. She is wondering if I will replace that for her today I will be more than happy to do that. 05/01/2019 on evaluation today patient actually appears to be doing quite well with regard to her lower extremities. She occasionally is having areas that will leak and then heal up mainly when a piece of the fibrotic skin pops off but fortunately she is not having any signs of active infection at this time. Overall she also really does not have  any obvious weeping at this time. I do believe however she really needs some compression wraps and I think this may be a good time to get her back to the lymphedema clinic. 05/11/2019 on evaluation today patient actually appears to be doing quite well with regard to her bilateral lower extremities. She occasionally will have a small area that we per another but in general seems to be completely healed which is great news. Overall very pleased with how everything seems to be progressing. She does have her appointment with lymphedema clinic on November 18. 05/25/2019 on evaluation today patient appears to be doing well with regard to her left lower extremity. I am very pleased in this regard. In regard to her right leg this actually did start draining more I think it is mainly due to the fact that her leg is more swollen. I am not seeing any obvious signs of infection at this time although that is definitely something were obviously acutely aware of simply due to the fact that she had an issue not too far back with exactly this issue. Nonetheless I do feel like that lymphedema clinic would still be beneficial for her. I explained obviously if they are not able to do anything treatment wise on the right leg we could at least have them treat her left leg and then proceed from there. The patient is really in agreement with that plan. If they are able to do both as the drainage slows down that I would be happy to let them handle both. 06/01/2019 on evaluation today patient unfortunately appears to be doing worse with regard to her right lower extremity. The left lower extremity is still maintaining at this point. Unfortunately she has been having significantly increased pain over the past several days and has been experiencing as well increased swelling of the right lower extremity. I really do not know that I am seeing anything that appears to be obvious for infection at this point to be peripherally  honest. With that being said the patient does seem to be having much more swelling that she is even experienced in the past and coupled with increased pain in her hip as well I am concerned that again she could potentially have a DVT although I am not 100% sure of this. I think  it something that may need to be checked out. We discussed the possibility of sending her for a DVT study through the hospital but unfortunately transportation is an issue if she does have a DVT I do not want her to wait days to be able to get in for that test however if she has this scheduled as an outpatient that is as fast that she will be able to get the test scheduled for transportation purposes. That will also fall on Thanksgiving so subsequently she did actually be looking at either Friday or even next week before we would know anything back from this. That is much too long in my opinion. Subsequent to the amount of discomfort she is experiencing the patient is actually okay with going to the ER for evaluation today. 06/12/2019 on evaluation today patient actually appears to be doing significantly better compared to last time I saw her. Following when I last saw her she was actually in the hospital from that Monday until the following Sunday almost 1 full week. She actually was placed on Keflex in the hospital following the time for her to be discharged and Dr. Steva Ready has recommended 2 times a day dosing of the Keflex for the next year in order to help with more prophylactic/preventative measures with regard to her developing cellulitis. Overall I think this sounds like an excellent plan. The patient unfortunately is good to have trouble being treated at lymphedema clinic due to the fact that she really cannot get up on the bed that they have there. They also state that they cannot manage her as long as she has anything draining at this point. Obviously that is somewhat unfortunate as she does need help with edema  control but nonetheless we will have to do what we can for her outside of it sounds like the lymphedema clinic scenario at this point. 06/19/2019 on evaluation today patient appears to be doing fairly well with regard to her bilateral lower extremities. She is not nearly as swollen and shows no signs of infection at this point. There is no evidence of cellulitis whatsoever. She also has no open wounds or draining at this point which is also good news. No fever chills noted. She seems to be in very good spirits and in fact appears to be doing quite well. READMISSION 11/27/2019 [name], [name] ([id number]) This is a 66 year old woman that we have had in this clinic several times before including 2015, 16 and 19 and then most recently from 03/20/2019 through 06/19/2019 with bilateral lower extremity lymphedema. She has had previous arterial and reflux studies done years ago which were not all that remarkable. 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 She does have a positive family history and she had large legs starting may be in her 16s. She was recently in hospital from 10/20/2019 through 10/28/2019 with right leg cellulitis. She was given Ancef and clindamycin and then Zosyn when a culture showed Pseudomonas. At that time there was purulent drainage. She was followed by infectious disease Dr Steva Ready. The patient is now back at home. She has noted increased swelling in the right and no drainage in her right leg mostly on the posterior medial aspect in the calf area. She has not had pain or fever. She has literally been improved lysing above dressings because her at the area of this is far too large for standard compression. She has been wrapping the areas with sheets to resorptive  pads. She is found these helped somewhat. She does have an appointment with the lymphedema clinic in [place] in late June. Past medical history includes bilateral  lymphedema, hypertension, obstructive sleep apnea with CPAP. Recent hospitalization with apparently Pseudomonas cellulitis of the right lower leg 12/15/2019 upon evaluation today patient appears to be doing a little bit worse in regard to her right lower extremity. Unfortunately she is having more weeping down in the lower portion of her leg. Fortunately there is no signs of active infection at this time. No fever chills noted. The patient states she is not having increased pain except for when she attempted to use the lymphedema pumps unfortunately she states that she did have pain when she did this. Otherwise we been using absorptive dressings of one type or another she is using diapers at home and then subsequently Ace wraps. In regard to the barrier cream we have discussed the possibility of derma cloud which she would like to try I do not have a problem with that. 12/22/2019 upon evaluation today patient actually appears to be doing better in regard to her leg ulcers at this point. Fortunately there does not appear to be any signs of active infection which is great news and I am extremely pleased with where things are progressing at this time. There is no sign of active infection currently. The patient is very pleased to see things doing so well. 12/29/2019 upon evaluation today patient appears to be doing a little bit better in regard to her weeping in general over her lower extremities. She does have some signs of mild erythema little bit more than what I noted last week or rather last visit. Nonetheless I think that my threshold for switching her antibiotics from Keflex to something else is very low at this point considering that she has had such severe infections in the past that seem to come almost out of nowhere. There is a little erythema and warmth noted of the lower portion of her leg compared to the upper which also makes me want to go ahead and address things more rapidly at this point.  Likely I would switch out the Keflex for something like Levaquin ideally. 7/16; patient with severe bilateral lymphedema. She has superficial wounds albeit almost circumferential now on the left lateral lower leg. This may be new from last time. Small area on the right anterior lower leg and then another area on the right medial lower leg and of pannus fold. She has been using various absorptive garments. She states she is using her compression pumps once a day occasionally twice. Culture from her last visit here was negative 01/29/2020 on evaluation today patient appears to be doing excellent at this point in regard to her legs with regard to infection I see no signs of active infection at this point. She still does have unfortunately areas of weeping this is minimal on the right now her left is actually significantly worse although I do not think it is as bad as last week with Dr. Dellia Nims saw her. She has been trying to pump and elevate her legs is much as possible. She has previously been on the Keflex and in the past for prevention that seems to do fairly well and likely can extend that today. 02/04/2020 on evaluation today patient appears to be doing better in regard to her legs bilaterally. Fortunately there is no signs of active infection at this time which is great news and overall she has less weeping  on the left compared to the right and there is several spots where she is pretty much sealed up with no draining regions. Overall very pleased in this regard. 02/19/2020 on evaluation today patient appears to be doing very well in regard to her wounds currently. Fortunately there is no evidence of active infection overall very pleased with where things stand. She is significantly improved in regard to her edema I am extremely pleased in this regard she tells me that the popping no longer hurts and in fact she actually looks forward to it. 03/04/2020 on evaluation today patient appears to be doing  excellent in regard to her lower extremities. Fortunately there is no signs of active infection at this time. No fevers, chills, nausea, vomiting, or diarrhea. 03/25/2020 on evaluation today patient appears to be doing a little bit more poorly in regard to her legs at this point. She tells me that she is still continue to have issues with drainage and this has been a little bit worse she was getting ready to start taking the Keflex again but wanted to see me first. Fortunately there is no signs of active infection at this time. No fevers, chills, nausea, vomiting, or diarrhea. 04/15/2020 upon evaluation today patient appears to be doing somewhat poorly in regard to her right leg. She tells me she has been having more pain she has been taking the Keflex that was previously prescribed unfortunately that just does not seem to help with this. She was hoping that the pain on her right was actually coming from the fact that she was having issues with her wrap having gotten caught in her recliner. With that being said she tells me that she knew something was not right. Currently her right leg is warm to touch along with being erythematous all the way up to around at least mid thigh as far as I can see. The left leg does not appear to be doing that badly though there is increased weeping around the ankle region. 05/06/2020 on evaluation today patient appears to be doing much better than last time I saw her. She did go to the hospital where she was admitted for 2 days and treated with antibiotic therapy. She was discharged with antibiotics as well and has done extremely well. I am extremely pleased with where things stand today. There is no signs of active infection at this time which is great news. 05/27/2020 upon evaluation today patient appears to be doing well with regard to her lower extremities bilaterally. She has just a very tiny area on the right leg which is opening on the left leg she is significantly  improved though she still has several areas that do appear to be open this is minimal compared to what is been in the past. In general I am extremely pleased with where things stand today. The patient does tell me she is not been using her pumps quite as much as she should be. I do believe that is 1 area she can definitely work on. She has had a lot going on including a Covid exposure and apparently also a outbreak of likely shingles. 06/24/2020 upon evaluation today patient appears to be doing well with regard to her legs in general although the left leg unfortunately is showing some signs of erythema she does have a little bit of increased weeping and to be honest I am concerned about infection here. I discussed that with her today and I think that we may need to address  this sooner rather than later she has been taking Keflex she is not really certain that is been making a big improvement however. No fevers, chills, nausea, vomiting, or diarrhea. [name], [name] ([id number]) 06/30/2020 upon evaluation today patient's legs actually seem to be doing better in my opinion as compared to where they were last week. Fortunately there does not appear to be any signs of active infection. Her culture showed multiple organisms nothing predominate. With that being said the Levaquin seems to have done well I think she has improved since I last saw her as well. 07/14/2020 upon evaluation today patient appears to be doing actually better in regard to her lower extremities in my opinion. She has been tolerating the dressing changes without complication. Fortunately there is no signs of active infection at this time. 08/04/2020 upon evaluation today patient appears to be doing excellent in regard to her leg ulcers. Fortunately she has very little that is open at this point. This is great news. In fact I think that the Gold[employer] medicated powder has been excellent for her. It seems to have done the trick where we had  tried several other things without as much success. Fortunately there is no evidence of active infection at this time. No fevers, chills, nausea, vomiting, or diarrhea. 09/01/2020 upon evaluation today patient appears to be doing a little bit more poorly than the last time I saw her. She tells me right now that she has been having a lot of drainage compared to where things were previous which has unfortunately led to more irritation as well. She is concerned that this is leading to infection. Again based on what I am seeing today as well I am also concerned of the same to be honest. 09/15/2020 upon evaluation today patient appears to be doing well with regard to her legs compared to what I saw previous. Fortunately there does not appear to be any evidence of active infection at this time which is great news. At least not as badly as what it was previous. With that being said I do believe that the patient does need to have an extension of the antibiotics. This I believe will actually help her more in the way of making sure this stays under control and does not worsen. That was the Bactrim DS. Readmission: 08/01/2021 upon evaluation today patient appears to be doing actually pretty well all things considered. Is actually been almost a year since have seen her last March. Fortunately I do not see any evidence of active infection locally nor systemically at this point. She does have a couple open areas on the left leg the right leg appears to be doing quite well. In general she has been in the hospital 3 times since I saw her a year ago all 4 cellulitis type issues except for the last 1 which was actually more related to congestive heart failure for that reason she is not using her lymphedema pumps at this point and that is probably the best idea. 08/08/2021 upon evaluation today patient appears to be doing well with regard to her legs all things considered her left leg may be a little bit more swollen based  on what I see today. Fortunately I do not see any signs of active infection locally nor systemically at this time which is great news. No fevers, chills, nausea, vomiting, or diarrhea. 08/15/2021 upon evaluation today patient actually appears to be doing excellent in regard to her wounds. She has been tolerating the dressing  changes without complication. Fortunately I see no evidence of infection currently which is great news. No fevers, chills, nausea, vomiting, or diarrhea. Electronic Signature(s) Signed: 08/15/2021 2:53:51 PM By: Worthy Keeler PA-C Entered By: Worthy Keeler on 08/15/2021 14:53:51 Crabbe, [name] ([id number]) -------------------------------------------------------------------------------- Physical Exam Details Patient Name: MERIAN, WROE C. Date of Service: 08/15/2021 2:00 PM Medical Record Number: [id number] Patient Account Number: [id number] Date of Birth/Sex: 08/06/1955 (66 y.o. F) Treating RN: Cornell Barman Primary Care Provider: Ranae Plumber Other Clinician: Referring Provider: Ranae Plumber Treating Provider/Extender: Skipper Cliche in Treatment: 2 Constitutional Obese and well-hydrated in no acute distress. Respiratory normal breathing without difficulty. Psychiatric this patient is able to make decisions and demonstrates good insight into disease process. Alert and Oriented x 3. pleasant and cooperative. Notes Upon inspection patient's wounds showed signs of being completely healed on the left side and on the right side was significantly better. I am actually extremely pleased with where things stand today. Electronic Signature(s) Signed: 08/15/2021 2:54:12 PM By: Worthy Keeler PA-C Entered By: Worthy Keeler on 08/15/2021 14:54:12 [name], [name] ([id number]) -------------------------------------------------------------------------------- Physician Orders Details Patient Name: [name] Date of Service: 08/15/2021 2:00 PM Medical Record  Number: [id number] Patient Account Number: [id number] Date of Birth/Sex: January 02, 1956 (66 y.o. 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 o Nurse Visit as needed Bathing/ Shower/ Hygiene o May shower; gently cleanse wound with antibacterial soap, rinse and pat dry prior to dressing wounds Wound Treatment Wound #9 - Lower Leg Wound Laterality: Right Primary Dressing: Silvercel 4 1/4x 4 1/4 (in/in) 1 x Per Day/30 Days Discharge Instructions: Apply Silvercel 4 1/4x 4 1/4 (in/in) as instructed Secondary Dressing: ABD Pad 5x9 (in/in) 1 x Per Day/30 Days Discharge Instructions: Cover with ABD pad Secured With: 27M ACE Elastic Bandage With VELCRO Brand Closure, 4 (in) 1 x Per Day/30 Days Secured With: 27M Medipore H Soft Cloth Surgical Tape, 2x2 (in/yd) 1 x Per Day/30 Days Electronic Signature(s) Signed: 08/15/2021 2:42:57 PM By: Gretta Cool, BSN, RN, CWS, Kim RN, BSN Signed: 08/15/2021 4:09:23 PM By: Worthy Keeler PA-C Entered By: Gretta Cool, BSN, RN, CWS, Kim on 08/15/2021 14:42:57 Johnsey, [name] ([id number]) -------------------------------------------------------------------------------- Problem List Details Patient Name: ETTEL, ALBERGO C. Date of Service: 08/15/2021 2:00 PM Medical Record Number: [id number] Patient Account Number: [id number] Date of Birth/Sex: 07/21/1955 (66 y.o. 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 Date of Service: 08/15/2021 2:00 PM  Medical Record Number: [id number] Patient Account Number: [id number] Date of Birth/Sex: 12-06-1955 (66 y.o. F) Treating RN: Cornell Barman Primary Care Provider: Ranae Plumber Other Clinician: Referring Provider: Ranae Plumber Treating Provider/Extender: Skipper Cliche in Treatment: 2 Subjective Chief Complaint Information obtained from Patient Bilateral LE lymphedema with Left LE Ulcers History of Present Illness (HPI) The patient is a 66 year old female with history of hypertension and a long-standing history of bilateral lower extremity lymphedema (first presented on 4/2) . She has had open ulcers in the past which have always responded to compression therapy. She had briefly been to a lymphedema clinic in the past which helped her at the time. this time around she stopped treatment of her lymphedema pumps approximately 2 weeks ago because of some pain in the knees and then noticed the right leg getting worse. She was seen by her PCP who put her on clindamycin 4 times a day 2 days ago. The patient has seen [facility] and Dr. Delana Meyer had seen her last year where a vascular study including venous and arterial duplex studies were  within normal limits. he had recommended compression stockings and lymphedema pumps and the patient has been using this in about 2 weeks ago. She is known to be diabetic but in the past few time she's gone to her primary care doctor her hemoglobin A1c has been normal. 02/11/2015 - after her last visit she took my advice and went to the ER regarding the progressive cellulitis of her right lower extremity and she was admitted between July 17 and 22nd. She received IV antibiotics and then was sent home on a course of steroid-induced and oral antibiotics. She has improved much since then. 02/17/2015 -- she has been doing fine and the weeping of her legs has remarkably gone down. She has no fresh issues. READMISSION 01/15/18 This patient was given this clinic before most recently in 2016 seen by Dr. Con Memos. She has massive bilateral lymphedema and over the last 2 months this had weeping edema out of the left leg. She has compression pumps but her compliance with these has been minimal. She has [facility] they've been using TCA/ABDs/kerlix under an Ace wrap.she has had recent problems with cellulitis. She was apparently seen in the ER and 12/23/17 and given clindamycin. She was then followed by her primary doctor and given doxycycline and Keflex. The pain seems to have settled down. In April 2018 the patient had arterial studies done at [facility]. This showed triphasic waveforms throughout the right leg and mostly triphasic waveforms on the left except for monophasic at the posterior tibial artery distally. She was not felt to have evidence of right lower extremity arterial stenosis or significant problems on the left side. 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 She also had a right lower extremity venous Doppler in January 2018 this was limited by the patient's body habitus and lymphedema. Most of the proximal veins were not visualized The patient  presents with an area of denuded skin on the anterior medial part of the left calf. There is weeping edema fluid here. 01/22/18; the patient has somewhat better edema control using her compression pumps twice a day and as a result she has much better epithelialization on the left anterior calf area. Only a small open area remains. 01/29/18; the patient has been compliant with her compression pumps. Both the areas on her calf that healed. The remaining area on the left anterior leg is fully  epithelialized Readmission: 02/20/2019 upon evaluation today patient presents for reevaluation due to issues that she is having with the bilateral lower extremities. She actually has wounds open on both legs. On the right she has an area in the crease of her leg on the right around the knee region which is actually draining quite a bit and actually has some fungal type appearance to it. She has been on nystatin powder that seems to have helped to some degree. In regard to the left lower extremity this is actually in the lower portion of her leg closer to the ankle and again is continuing to drain as well unfortunately. There does not appear to be any signs of active infection at this time which is good news. No fevers, chills, nausea, vomiting, or diarrhea. She tells me that since she was seen last year she is actually been doing quite well for the most part with regard to her lower extremities. Unfortunately she now is experiencing a little bit more drainage at this time. She is concerned about getting this under control so that it does not get significantly worse. 02/27/2019 on evaluation today patient appears to be doing somewhat better in regard to her bilateral lower extremity wounds. She has been tolerating the dressing changes without complication. Fortunately there is no signs of active infection at this point. No fevers, chills, nausea, vomiting, or diarrhea. She did get her dressing supplies which is  excellent news she was extremely excited to get these. She also got paperwork from [employer] for their financial assistance program where they may be able to help her out in the future if needed with supplies at discounted prices. 03/06/2019 on evaluation today patient appears to be doing a little worse with regard to both areas of weeping on her bilateral lower extremities. This is around the right medial knee and just above the left ankle. With that being said she is unfortunately not doing as well as I would like to see. I feel like she may need to potentially go see someone at the lymphedema clinic as the wraps that she needs or even beyond what we can do here at the wound care center. She really does not have wounds she just has open areas of weeping that are causing some difficulty for her. Subsequently because of this and the moisture I am concerned about the potential for infection I am going to likely give her a prophylactic antibiotic today, Keflex, just to be on the safe side. Nonetheless again there is no obvious signs of active infection at this time. MYLI, PAE ([id number]) 03/13/2019 on evaluation today patient appears to be doing well with regard to her bilateral lower extremities where she has been weeping compared to even last week's evaluation. I see some areas of new skin growth which is excellent and overall I am very pleased with how things seem to be progressing. No fevers, chills, nausea, vomiting, or diarrhea. 03/20/2019 on evaluation today patient unfortunately is continuing to have issues with significant edema of the left lower extremity. Her right side seems to be doing much better. Unfortunately her left side is showing increased weeping of the lower portion of her leg. This is quite unfortunate obviously we were hoping to get her into the lymphedema clinic they really do not seem to when I see her how if she is draining. Despite the fact this is really not wound related  but more lymphedema weeping related. Nonetheless I do not know that this can be  helpful for her to even go for that appointment since again I am not sure there is much that they would actually do at this point. We may need to try a 4 layer compression wrap as best we can on her leg. She is on the Augmentin currently although I am still concerned about whether or not there could be potentially something going on infection wise I would obtain a culture though I understand is not the best being that is a surface culture I just 1 to make sure I do not seem to be missing anything. 03/27/2019 on evaluation today patient appears to be doing much better in regard to the left lower extremity compared to last week. Last week she had tremendous weeping which I think was subsequent to infection now she seems to be doing much better and very pleased. This is not completely healed but there is a lot of new skin growth and it has dried out quite a bit. Overall I think that we are doing well with how things are moving along at this time. No fevers, chills, nausea, vomiting, or diarrhea. 04/03/2019 evaluation today patient appears to be doing a little worse this week compared to last time I saw her. I think this may be due to the fact that she is having issues with not being able to sleep in her bed at least not until last night. She is therefore been in a lift chair and subsequently has also had issues with not been able to use her pumps since she could not get in bed. With that being said the patient overall seems to be doing okay I do think I may want extend the antibiotic for a little bit longer at least until we can see if her edema and her weeping gets better and if it is then obviously I can always discontinue the antibiotics as of next week however I want her to continue to have it over the next week. 04/10/2019 on evaluation today patient unfortunately is still doing poorly with regard to her left lower  extremity. Her right is all things considering doing fairly well. On the left however she continues to have spreading of the area of infection and weeping which appears to be even a larger surface area than noted last week. She did have a positive culture for Pseudomonas in particular which seems to have been of concern she still has green/yellow discharge consistent with Pseudomonas and subsequently a tremendous amount of it. This has me obviously still concerned about the infection not really clearing up despite the fact that on culture it appears the Cipro should have been a good option for treating this. I think she may at this point need IV antibiotics since things are not doing better I do not want to get worse and cause sepsis. She is in agreement with the plan and believes as well that she likely does need to go to the hospital for IV vancomycin. Or something of the like depending on what the recommendation is from the ER. 04/17/2019 on evaluation today patient appears to be doing excellent in regard to her lower extremity on the left. She was in the hospital for several days from when I sent her last we saw her until just this past Tuesday. Fortunately her drainage is significantly improved and in fact is mostly clear. There is just a couple small areas that may still drain a little bit she states that the [place] they prescribed for her at discharge  she went picked up from pharmacy and got home but has not been able to find it since. She is looked everywhere. She is wondering if I will replace that for her today I will be more than happy to do that. 05/01/2019 on evaluation today patient actually appears to be doing quite well with regard to her lower extremities. She occasionally is having areas that will leak and then heal up mainly when a piece of the fibrotic skin pops off but fortunately she is not having any signs of active infection at this time. Overall she also really does not have  any obvious weeping at this time. I do believe however she really needs some compression wraps and I think this may be a good time to get her back to the lymphedema clinic. 05/11/2019 on evaluation today patient actually appears to be doing quite well with regard to her bilateral lower extremities. She occasionally will have a small area that we per another but in general seems to be completely healed which is great news. Overall very pleased with how everything seems to be progressing. She does have her appointment with lymphedema clinic on November 18. 05/25/2019 on evaluation today patient appears to be doing well with regard to her left lower extremity. I am very pleased in this regard. In regard to her right leg this actually did start draining more I think it is mainly due to the fact that her leg is more swollen. I am not seeing any obvious signs of infection at this time although that is definitely something were obviously acutely aware of simply due to the fact that she had an issue not too far back with exactly this issue. Nonetheless I do feel like that lymphedema clinic would still be beneficial for her. I explained obviously if they are not able to do anything treatment wise on the right leg we could at least have them treat her left leg and then proceed from there. The patient is really in agreement with that plan. If they are able to do both as the drainage slows down that I would be happy to let them handle both. 06/01/2019 on evaluation today patient unfortunately appears to be doing worse with regard to her right lower extremity. The left lower extremity is still maintaining at this point. Unfortunately she has been having significantly increased pain over the past several days and has been experiencing as well increased swelling of the right lower extremity. I really do not know that I am seeing anything that appears to be obvious for infection at this point to be peripherally  honest. With that being said the patient does seem to be having much more swelling that she is even experienced in the past and coupled with increased pain in her hip as well I am concerned that again she could potentially have a DVT although I am not 100% sure of this. I think it something that may need to be checked out. We discussed the possibility of sending her for a DVT study through the hospital but unfortunately transportation is an issue if she does have a DVT I do not want her to wait days to be able to get in for that test however if she has this scheduled as an outpatient that is as fast that she will be able to get the test scheduled for transportation purposes. That will also fall on Thanksgiving so subsequently she did actually be looking at either Friday or even next week before we  would know anything back from this. That is much too long in my opinion. Subsequent to the amount of discomfort she is experiencing the patient is actually okay with going to the ER for evaluation today. 06/12/2019 on evaluation today patient actually appears to be doing significantly better compared to last time I saw her. Following when I last saw her she was actually in the hospital from that Monday until the following Sunday almost 1 full week. She actually was placed on Keflex in the hospital following the time for her to be discharged and Dr. Steva Ready has recommended 2 times a day dosing of the Keflex for the next year in order to help with more prophylactic/preventative measures with regard to her developing cellulitis. Overall I think this sounds like an excellent plan. The patient unfortunately is good to have trouble being treated at lymphedema clinic due to the fact that she really cannot get up on the bed that they have there. They also state that they cannot manage her as long as she has anything draining at this point. Obviously that is somewhat unfortunate as she does need help with edema  control but nonetheless we will have to do what we can for her outside of it sounds like the lymphedema clinic scenario at this point. 06/19/2019 on evaluation today patient appears to be doing fairly well with regard to her bilateral lower extremities. She is not nearly as swollen [name], [name] ([id number]) and shows no signs of infection at this point. There is no evidence of cellulitis whatsoever. She also has no open wounds or draining at this point which is also good news. No fever chills noted. She seems to be in very good spirits and in fact appears to be doing quite well. READMISSION 11/27/2019 This is a 66 year old woman that we have had in this clinic several times before including 2015, 16 and 19 and then most recently from 03/20/2019 through 06/19/2019 with bilateral lower extremity lymphedema. She has had previous arterial and reflux studies done years ago which were not all that remarkable. 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 She does have a positive family history and she had large legs starting may be in her 46s. She was recently in hospital from 10/20/2019 through 10/28/2019 with right leg cellulitis. She was given Ancef and clindamycin and then Zosyn when a culture showed Pseudomonas. At that time there was purulent drainage. She was followed by infectious disease Dr Steva Ready. The patient is now back at home. She has noted increased swelling in the right and no drainage in her right leg mostly on the posterior medial aspect in the calf area. She has not had pain or fever. She has literally been improved lysing above dressings because her at the area of this is far too large for standard compression. She has been wrapping the areas with sheets to resorptive pads. She is found these helped somewhat. She does have an appointment with the lymphedema clinic in [place] in late June. Past medical history includes bilateral  lymphedema, hypertension, obstructive sleep apnea with CPAP. Recent hospitalization with apparently Pseudomonas cellulitis of the right lower leg 12/15/2019 upon evaluation today patient appears to be doing a little bit worse in regard to her right lower extremity. Unfortunately she is having more weeping down in the lower portion of her leg. Fortunately there is no signs of active infection at this time. No fever chills noted. The patient states she is  not having increased pain except for when she attempted to use the lymphedema pumps unfortunately she states that she did have pain when she did this. Otherwise we been using absorptive dressings of one type or another she is using diapers at home and then subsequently Ace wraps. In regard to the barrier cream we have discussed the possibility of derma cloud which she would like to try I do not have a problem with that. 12/22/2019 upon evaluation today patient actually appears to be doing better in regard to her leg ulcers at this point. Fortunately there does not appear to be any signs of active infection which is great news and I am extremely pleased with where things are progressing at this time. There is no sign of active infection currently. The patient is very pleased to see things doing so well. 12/29/2019 upon evaluation today patient appears to be doing a little bit better in regard to her weeping in general over her lower extremities. She does have some signs of mild erythema little bit more than what I noted last week or rather last visit. Nonetheless I think that my threshold for switching her antibiotics from Keflex to something else is very low at this point considering that she has had such severe infections in the past that seem to come almost out of nowhere. There is a little erythema and warmth noted of the lower portion of her leg compared to the upper which also makes me want to go ahead and address things more rapidly at this point.  Likely I would switch out the Keflex for something like Levaquin ideally. 7/16; patient with severe bilateral lymphedema. She has superficial wounds albeit almost circumferential now on the left lateral lower leg. This may be new from last time. Small area on the right anterior lower leg and then another area on the right medial lower leg and of pannus fold. She has been using various absorptive garments. She states she is using her compression pumps once a day occasionally twice. Culture from her last visit here was negative 01/29/2020 on evaluation today patient appears to be doing excellent at this point in regard to her legs with regard to infection I see no signs of active infection at this point. She still does have unfortunately areas of weeping this is minimal on the right now her left is actually significantly worse although I do not think it is as bad as last week with Dr. Dellia Nims saw her. She has been trying to pump and elevate her legs is much as possible. She has previously been on the Keflex and in the past for prevention that seems to do fairly well and likely can extend that today. 02/04/2020 on evaluation today patient appears to be doing better in regard to her legs bilaterally. Fortunately there is no signs of active infection at this time which is great news and overall she has less weeping on the left compared to the right and there is several spots where she is pretty much sealed up with no draining regions. Overall very pleased in this regard. 02/19/2020 on evaluation today patient appears to be doing very well in regard to her wounds currently. Fortunately there is no evidence of active infection overall very pleased with where things stand. She is significantly improved in regard to her edema I am extremely pleased in this regard she tells me that the popping no longer hurts and in fact she actually looks forward to it. 03/04/2020 on evaluation today patient  appears to be doing  excellent in regard to her lower extremities. Fortunately there is no signs of active infection at this time. No fevers, chills, nausea, vomiting, or diarrhea. 03/25/2020 on evaluation today patient appears to be doing a little bit more poorly in regard to her legs at this point. She tells me that she is still continue to have issues with drainage and this has been a little bit worse she was getting ready to start taking the Keflex again but wanted to see me first. Fortunately there is no signs of active infection at this time. No fevers, chills, nausea, vomiting, or diarrhea. 04/15/2020 upon evaluation today patient appears to be doing somewhat poorly in regard to her right leg. She tells me she has been having more pain she has been taking the Keflex that was previously prescribed unfortunately that just does not seem to help with this. She was hoping that the pain on her right was actually coming from the fact that she was having issues with her wrap having gotten caught in her recliner. With that being said she tells me that she knew something was not right. Currently her right leg is warm to touch along with being erythematous all the way up to around at least mid thigh as far as I can see. The left leg does not appear to be doing that badly though there is increased weeping around the ankle region. 05/06/2020 on evaluation today patient appears to be doing much better than last time I saw her. She did go to the hospital where she was admitted for 2 days and treated with antibiotic therapy. She was discharged with antibiotics as well and has done extremely well. I am extremely pleased with where things stand today. There is no signs of active infection at this time which is great news. 05/27/2020 upon evaluation today patient appears to be doing well with regard to her lower extremities bilaterally. She has just a very tiny area on the right leg which is opening on the left leg she is significantly  improved though she still has several areas that do appear to be open this is minimal compared to what is been in the past. In general I am extremely pleased with where things stand today. The patient does tell me she is not been using her pumps quite as much as she should be. I do believe that is 1 area she can definitely work on. She has had a lot going on including a Covid exposure and apparently also a outbreak of likely shingles. [name], [name] ([id number]) 06/24/2020 upon evaluation today patient appears to be doing well with regard to her legs in general although the left leg unfortunately is showing some signs of erythema she does have a little bit of increased weeping and to be honest I am concerned about infection here. I discussed that with her today and I think that we may need to address this sooner rather than later she has been taking Keflex she is not really certain that is been making a big improvement however. No fevers, chills, nausea, vomiting, or diarrhea. 06/30/2020 upon evaluation today patient's legs actually seem to be doing better in my opinion as compared to where they were last week. Fortunately there does not appear to be any signs of active infection. Her culture showed multiple organisms nothing predominate. With that being said the Levaquin seems to have done well I think she has improved since I last saw her as well. 07/14/2020  upon evaluation today patient appears to be doing actually better in regard to her lower extremities in my opinion. She has been tolerating the dressing changes without complication. Fortunately there is no signs of active infection at this time. 08/04/2020 upon evaluation today patient appears to be doing excellent in regard to her leg ulcers. Fortunately she has very little that is open at this point. This is great news. In fact I think that the Goldbond medicated powder has been excellent for her. It seems to have done the trick where we had  tried several other things without as much success. Fortunately there is no evidence of active infection at this time. No fevers, chills, nausea, vomiting, or diarrhea. 09/01/2020 upon evaluation today patient appears to be doing a little bit more poorly than the last time I saw her. She tells me right now that she has been having a lot of drainage compared to where things were previous which has unfortunately led to more irritation as well. She is concerned that this is leading to infection. Again based on what I am seeing today as well I am also concerned of the same to be honest. 09/15/2020 upon evaluation today patient appears to be doing well with regard to her legs compared to what I saw previous. Fortunately there does not appear to be any evidence of active infection at this time which is great news. At least not as badly as what it was previous. With that being said I do believe that the patient does need to have an extension of the antibiotics. This I believe will actually help her more in the way of making sure this stays under control and does not worsen. That was the Bactrim DS. Readmission: 08/01/2021 upon evaluation today patient appears to be doing actually pretty well all things considered. Is actually been almost a year since have seen her last March. Fortunately I do not see any evidence of active infection locally nor systemically at this point. She does have a couple open areas on the left leg the right leg appears to be doing quite well. In general she has been in the hospital 3 times since I saw her a year ago all 4 cellulitis type issues except for the last 1 which was actually more related to congestive heart failure for that reason she is not using her lymphedema pumps at this point and that is probably the best idea. 08/08/2021 upon evaluation today patient appears to be doing well with regard to her legs all things considered her left leg may be a little bit more swollen based  on what I see today. Fortunately I do not see any signs of active infection locally nor systemically at this time which is great news. No fevers, chills, nausea, vomiting, or diarrhea. 08/15/2021 upon evaluation today patient actually appears to be doing excellent in regard to her wounds. She has been tolerating the dressing changes without complication. Fortunately I see no evidence of infection currently which is great news. No fevers, chills, nausea, vomiting, or diarrhea. Objective Constitutional Obese and well-hydrated in no acute distress. Vitals Time Taken: 1:50 PM, Height: 63 in, Weight: 372 lbs, BMI: 65.9, Temperature: 98.2 F, Pulse: 76 bpm, Respiratory Rate: 20 breaths/min. Respiratory normal breathing without difficulty. Psychiatric this patient is able to make decisions and demonstrates good insight into disease process. Alert and Oriented x 3. pleasant and cooperative. General Notes: Upon inspection patient's wounds showed signs of being completely healed on the left side and on  the right side was significantly better. I am actually extremely pleased with where things stand today. Integumentary (Hair, Skin) Wound #8 status is Healed - Epithelialized. Original cause of wound was Gradually Appeared. The date acquired was: 07/18/2021. The wound has been in treatment 2 weeks. The wound is located on the Left,Lateral Lower Leg. The wound measures 0cm length x 0cm width x 0cm depth; 0cm^2 area and 0cm^3 volume. There is a medium amount of serosanguineous drainage noted. Wound #9 status is Open. Original cause of wound was Blister. The date acquired was: 07/31/2021. The wound has been in treatment 1 weeks. The wound is located on the Right Lower Leg. The wound measures 1cm length x 1.5cm width x 0.1cm depth; 1.178cm^2 area and 0.118cm^3 volume. There is a medium amount of serosanguineous drainage noted. 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 Nurse Visit as needed Bathing/ Shower/ Hygiene: May shower; gently cleanse wound with antibacterial soap, rinse and pat dry prior to dressing wounds WOUND #9: - Lower Leg Wound Laterality: Right Primary Dressing: Silvercel 4 1/4x 4 1/4 (in/in) 1 x Per Day/30 Days Discharge Instructions: Apply Silvercel 4 1/4x 4 1/4 (in/in) as instructed Secondary Dressing: ABD Pad 5x9 (in/in) 1 x Per Day/30 Days Discharge Instructions: Cover with ABD pad Secured With: 7M ACE Elastic Bandage With VELCRO Brand Closure, 4 (in) 1 x Per Day/30 Days Secured With: 7M Medipore H Soft Cloth Surgical Tape, 2x2 (in/yd) 1 x Per Day/30 Days 1. Would recommend currently that we going continue with the wound care measures as before and the patient is in agreement with plan. This includes the use of the silver alginate dressing to the right leg this is the only remaining wound at this point. 2. Also can recommend that we use the ABD pad and Ace wrap to secure in place. 3. I would also suggest patient continue to try to elevate her legs much as possible right now I think she is having a hard time being able to use her lymphedema pump so she has used those in the past. We will see patient back for reevaluation in 1 week here in the clinic. If anything worsens or changes patient will contact our office for additional recommendations. Electronic Signature(s) Signed: 08/15/2021 2:54:54 PM By: Worthy Keeler PA-C Entered By: Worthy Keeler on 08/15/2021 14:54:54 [name], [name] ([id number]) -------------------------------------------------------------------------------- SuperBill Details Patient Name: [name] Date of Service: 08/15/2021 Medical Record Number:  [id number] Patient Account Number: [id number] Date of Birth/Sex: Dec 28, 1955 (66 y.o. F) Treating RN: Cornell Barman Primary Care Provider: Ranae Plumber Other Clinician: Referring Provider: Ranae Plumber Treating Provider/Extender: Skipper Cliche in Treatment: 2 Diagnosis Coding ICD-10 Codes Code Description Q82.0 Hereditary lymphedema L97.811 Non-pressure chronic ulcer of other part of right lower leg limited to breakdown of skin L97.821 Non-pressure chronic ulcer of other part of left lower leg limited to breakdown of skin Facility Procedures CPT4 Code: [id number] Description: 99213 - WOUND CARE VISIT-LEV 3 EST PT Modifier: Quantity: 1 Physician Procedures CPT4 Code: 0947096 Description: 28366 - WC PHYS LEVEL 3 - EST PT Modifier: Quantity: 1 CPT4 Code: Description: ICD-10 Diagnosis Description Q82.0 Hereditary lymphedema L97.811 Non-pressure chronic ulcer of other part of right lower leg limited to bre L97.821 Non-pressure chronic  ulcer of other part of left lower leg limited to brea Modifier: akdown of skin kdown of skin Quantity: Electronic Signature(s) Signed: 08/15/2021 2:56:28 PM By: Worthy Keeler PA-C Entered By: Worthy Keeler on 08/15/2021 14:56:27

## 2021-08-16 NOTE — Progress Notes (Signed)
[name], [name] ([id number]) Visit Report for 08/15/2021 Arrival Information Details Patient Name: [name], [name]. Date of Service: 08/15/2021 2:00 PM Medical Record Number: [id number] Patient Account Number: [id number] Date of Birth/Sex: 1956-02-23 (66 y.o. F) Treating RN: Cornell Barman Primary Care Josilyn Shippee: Ranae Plumber Other Clinician: Referring Keven Soucy: Ranae Plumber Treating Yarianna Varble/Extender: Skipper Cliche in Treatment: 2 Visit Information History Since Last Visit Added or deleted any medications: No Patient Arrived: Cane Has Dressing in Place as Prescribed: Yes Arrival Time: 13:49 Pain Present Now: Yes Accompanied By: self Transfer Assistance: None Patient Identification Verified: Yes Secondary Verification Process Completed: Yes Electronic Signature(s) Signed: 08/16/2021 5:20:33 PM By: Gretta Cool, BSN, RN, CWS, Kim RN, BSN Entered By: Gretta Cool, BSN, RN, CWS, Kim on 08/15/2021 13:50:03 [name] ([id number]) -------------------------------------------------------------------------------- Clinic Level of Care Assessment Details Patient Name: [name]. Date of Service: 08/15/2021 2:00 PM Medical Record Number: [id number] Patient Account Number: [id number] Date of Birth/Sex: 1956-01-30 (66 y.o. F) Treating RN: Cornell Barman Primary Care Lynsi Dooner: Ranae Plumber Other Clinician: Referring Ary Lavine: Ranae Plumber Treating Novalee Horsfall/Extender: Skipper Cliche in Treatment: 2 Clinic Level of Care Assessment Items TOOL 4 Quantity Score []  - Use when only an EandM is performed on FOLLOW-UP visit 0 ASSESSMENTS - Nursing Assessment / Reassessment X - Reassessment of Co-morbidities (includes updates in patient status) 1 10 X- 1 5 Reassessment of Adherence to Treatment Plan ASSESSMENTS - Wound and Skin Assessment / Reassessment X - Simple Wound Assessment / Reassessment - one wound 1 5 []  - 0 Complex Wound Assessment / Reassessment - multiple wounds []  -  0 Dermatologic / Skin Assessment (not related to wound area) ASSESSMENTS - Focused Assessment X - Circumferential Edema Measurements - multi extremities 1 5 []  - 0 Nutritional Assessment / Counseling / Intervention []  - 0 Lower Extremity Assessment (monofilament, tuning fork, pulses) []  - 0 Peripheral Arterial Disease Assessment (using hand held doppler) ASSESSMENTS - Ostomy and/or Continence Assessment and Care []  - Incontinence Assessment and Management 0 []  - 0 Ostomy Care Assessment and Management (repouching, etc.) PROCESS - Coordination of Care X - Simple Patient / Family Education for ongoing care 1 15 []  - 0 Complex (extensive) Patient / Family Education for ongoing care X- 1 10 Staff obtains Consents, Records, Test Results / Process Orders []  - 0 Staff telephones HHA, Nursing Homes / Clarify orders / etc []  - 0 Routine Transfer to another Facility (non-emergent condition) []  - 0 Routine Hospital Admission (non-emergent condition) []  - 0 New Admissions / Biomedical engineer / Ordering NPWT, Apligraf, etc. []  - 0 Emergency Hospital Admission (emergent condition) X- 1 10 Simple Discharge Coordination []  - 0 Complex (extensive) Discharge Coordination PROCESS - Special Needs []  - Pediatric / Minor Patient Management 0 []  - 0 Isolation Patient Management []  - 0 Hearing / Language / Visual special needs []  - 0 Assessment of Community assistance (transportation, D/C planning, etc.) []  - 0 Additional assistance / Altered mentation []  - 0 Support Surface(s) Assessment (bed, cushion, seat, etc.) INTERVENTIONS - Wound Cleansing / Measurement Lahm, Christabell C. ([id number]) X- 1 5 Simple Wound Cleansing - one wound []  - 0 Complex Wound Cleansing - multiple wounds X- 1 5 Wound Imaging (photographs - any number of wounds) []  - 0 Wound Tracing (instead of photographs) X- 1 5 Simple Wound Measurement - one wound []  - 0 Complex Wound Measurement - multiple  wounds INTERVENTIONS - Wound Dressings []  - Small Wound Dressing one or multiple wounds 0 X- 1  15 Medium Wound Dressing one or multiple wounds []  - 0 Large Wound Dressing one or multiple wounds []  - 0 Application of Medications - topical []  - 0 Application of Medications - injection INTERVENTIONS - Miscellaneous []  - External ear exam 0 []  - 0 Specimen Collection (cultures, biopsies, blood, body fluids, etc.) []  - 0 Specimen(s) / Culture(s) sent or taken to Lab for analysis []  - 0 Patient Transfer (multiple staff / Civil Service fast streamer / Similar devices) []  - 0 Simple Staple / Suture removal (25 or less) []  - 0 Complex Staple / Suture removal (26 or more) []  - 0 Hypo / Hyperglycemic Management (close monitor of Blood Glucose) []  - 0 Ankle / Brachial Index (ABI) - do not check if billed separately X- 1 5 Vital Signs Has the patient been seen at the hospital within the last three years: Yes Total Score: 95 Level Of Care: New/Established - Level 3 Electronic Signature(s) Signed: 08/16/2021 5:20:33 PM By: Gretta Cool, BSN, RN, CWS, Kim RN, BSN Entered By: Gretta Cool, BSN, RN, CWS, Kim on 08/15/2021 14:43:43 [name], [name] ([id number]) -------------------------------------------------------------------------------- Encounter Discharge Information Details Patient Name: Elizabeth Landau C. Date of Service: 08/15/2021 2:00 PM Medical Record Number: [id number] Patient Account Number: [id number] Date of Birth/Sex: 1956-06-11 (66 y.o. F) Treating RN: Cornell Barman Primary Care Adrian Specht: Ranae Plumber Other Clinician: Referring Aide Wojnar: Ranae Plumber Treating Trease Bremner/Extender: Skipper Cliche in Treatment: 2 Encounter Discharge Information Items Discharge Condition: Stable Ambulatory Status: Ambulatory Discharge Destination: Home Transportation: Other Accompanied By: self Schedule Follow-up Appointment: Yes Clinical Summary of Care: Electronic Signature(s) Signed: 08/15/2021 2:47:04 PM By:  Gretta Cool, BSN, RN, CWS, Kim RN, BSN Entered By: Gretta Cool, BSN, RN, CWS, Kim on 08/15/2021 14:47:03 [name] ([id number]) -------------------------------------------------------------------------------- Lower Extremity Assessment Details Patient Name: [name], SELKE. Date of Service: 08/15/2021 2:00 PM Medical Record Number: [id number] Patient Account Number: [id number] Date of Birth/Sex: 02/12/1956 (66 y.o. F) Treating RN: Cornell Barman Primary Care Skyeler Scalese: Ranae Plumber Other Clinician: Referring Karime Scheuermann: Ranae Plumber Treating Skipper Dacosta/Extender: Skipper Cliche in Treatment: 2 Edema Assessment Assessed: [Left: No] [Right: No] [Left: Edema] [Right: :] Calf Left: Right: Point of Measurement: 39 cm From Medial Instep Ankle Left: Right: Point of Measurement: 9 cm From Medial Instep 55 cm 55 cm Vascular Assessment Pulses: Dorsalis Pedis Palpable: [Left:Yes] [Right:Yes] Electronic Signature(s) Signed: 08/16/2021 5:20:33 PM By: Gretta Cool, BSN, RN, CWS, Kim RN, BSN Entered By: Gretta Cool, BSN, RN, CWS, Kim on 08/15/2021 14:04:57 [name], [name] ([id number]) -------------------------------------------------------------------------------- Multi Wound Chart Details Patient Name: [name]. Date of Service: 08/15/2021 2:00 PM Medical Record Number: [id number] Patient Account Number: [id number] Date of Birth/Sex: 01-22-1956 (66 y.o. F) Treating RN: Cornell Barman Primary Care Kenneith Stief: Ranae Plumber Other Clinician: Referring Ellajane Stong: Ranae Plumber Treating Lennex Pietila/Extender: Skipper Cliche in Treatment: 2 Vital Signs Height(in): 63 Pulse(bpm): 76 Weight(lbs): 372 Blood Pressure(mmHg): Body Mass Index(BMI): 65.9 Temperature(F): 98.2 Respiratory Rate(breaths/min): 20 Photos: [8:No Photos] [N/A:N/A] Wound Location: Left, Lateral Lower Leg Right Lower Leg N/A Wounding Event: Gradually Appeared Blister N/A Primary Etiology: Lymphedema Lymphedema N/A Date Acquired: 07/18/2021  07/31/2021 N/A Weeks of Treatment: 2 1 N/A Wound Status: Healed - Epithelialized Open N/A Wound Recurrence: No No N/A Clustered Wound: Yes Yes N/A Measurements L x W x D (cm) 0x0x0 1x1.5x0.1 N/A Area (cm) : 0 1.178 N/A Volume (cm) : 0 0.118 N/A % Reduction in Area: 100.00% 90.50% N/A % Reduction in Volume: 100.00% 90.50% N/A Classification: Partial Thickness Full Thickness Without Exposed N/A Support  Structures Exudate Amount: Medium Medium N/A Exudate Type: Serosanguineous Serosanguineous N/A Exudate Color: red, brown red, brown N/A Treatment Notes Electronic Signature(s) Signed: 08/16/2021 5:20:33 PM By: Gretta Cool, BSN, RN, CWS, Kim RN, BSN Entered By: Gretta Cool, BSN, RN, CWS, Kim on 08/15/2021 [name], [name] ([id number]) -------------------------------------------------------------------------------- Multi-Disciplinary Care Plan Details Patient Name: [name], LISS. Date of Service: 08/15/2021 2:00 PM Medical Record Number: [id number] Patient Account Number: [id number] Date of Birth/Sex: Jan 18, 1956 (66 y.o. F) Treating RN: Cornell Barman Primary Care Deidra Spease: Ranae Plumber Other Clinician: Referring Bellamie Turney: Ranae Plumber Treating Cheyrl Buley/Extender: Skipper Cliche in Treatment: 2 Active Inactive Orientation to the Wound Care Program Nursing Diagnoses: Knowledge deficit related to the wound healing center program Goals: Patient/caregiver will verbalize understanding of the [place] Program Date Initiated: 08/01/2021 Target Resolution Date: 08/01/2021 Goal Status: Active Interventions: Provide education on orientation to the wound center Notes: Wound/Skin Impairment Nursing Diagnoses: Impaired tissue integrity Knowledge deficit related to ulceration/compromised skin integrity Goals: Ulcer/skin breakdown will have a volume reduction of 30% by week 4 Date Initiated: 08/01/2021 Target Resolution Date: 08/29/2021 Goal Status: Active Ulcer/skin breakdown  will have a volume reduction of 50% by week 8 Date Initiated: 08/01/2021 Target Resolution Date: 09/26/2021 Goal Status: Active Ulcer/skin breakdown will have a volume reduction of 80% by week 12 Date Initiated: 08/01/2021 Target Resolution Date: 10/24/2021 Goal Status: Active Ulcer/skin breakdown will heal within 14 weeks Date Initiated: 08/01/2021 Target Resolution Date: 11/07/2021 Goal Status: Active Interventions: Assess patient/caregiver ability to obtain necessary supplies Assess patient/caregiver ability to perform ulcer/skin care regimen upon admission and as needed Assess ulceration(s) every visit Provide education on ulcer and skin care Notes: Electronic Signature(s) Signed: 08/16/2021 5:20:33 PM By: Gretta Cool, BSN, RN, CWS, Kim RN, BSN Entered By: Gretta Cool, BSN, RN, CWS, Kim on 08/15/2021 14:05:31 [name] ([id number]) -------------------------------------------------------------------------------- Pain Assessment Details Patient Name: [name]. Date of Service: 08/15/2021 2:00 PM Medical Record Number: [id number] Patient Account Number: [id number] Date of Birth/Sex: 07/04/1956 (66 y.o. F) Treating RN: Cornell Barman Primary Care Montina Dorrance: Ranae Plumber Other Clinician: Referring Lamont Glasscock: Ranae Plumber Treating Justino Boze/Extender: Skipper Cliche in Treatment: 2 Active Problems Location of Pain Severity and Description of Pain Patient Has Paino Yes Site Locations Pain Location: Generalized Pain Rate the pain. Current Pain Level: 1 Pain Management and Medication Current Pain Management: Electronic Signature(s) Signed: 08/16/2021 5:20:33 PM By: Gretta Cool, BSN, RN, CWS, Kim RN, BSN Entered By: Gretta Cool, BSN, RN, CWS, Kim on 08/15/2021 13:51:04 [name] ([id number]) -------------------------------------------------------------------------------- Patient/Caregiver Education Details Patient Name: [name] Date of Service: 08/15/2021 2:00 PM Medical Record  Number: [id number] Patient Account Number: [id number] Date of Birth/Gender: 24-Mar-1956 (66 y.o. F) Treating RN: Cornell Barman Primary Care Physician: Ranae Plumber Other Clinician: Referring Physician: Ranae Plumber Treating Physician/Extender: Skipper Cliche in Treatment: 2 Education Assessment Education Provided To: Patient Education Topics Provided Wound/Skin Impairment: Handouts: Caring for Your Ulcer, Other: continue wound care as prescribed. Methods: Demonstration, Explain/Verbal Responses: State content correctly Electronic Signature(s) Signed: 08/16/2021 5:20:33 PM By: Gretta Cool, BSN, RN, CWS, Kim RN, BSN Entered By: Gretta Cool, BSN, RN, CWS, Kim on 08/15/2021 14:45:13 [name] ([id number]) -------------------------------------------------------------------------------- Wound Assessment Details Patient Name: [name], [name] Date of Service: 08/15/2021 2:00 PM Medical Record Number: [id number] Patient Account Number: [id number] Date of Birth/Sex: [birth year] (66 y.o. F) Treating RN: Cornell Barman Primary Care Gaylen Pereira: Ranae Plumber Other Clinician: Referring Mileena Rothenberger: Ranae Plumber Treating Asencion Loveday/Extender: Skipper Cliche in Treatment: 2 Wound  Status Wound Number: 8 Primary Etiology: Lymphedema Wound Location: Left, Lateral Lower Leg Wound Status: Healed - Epithelialized Wounding Event: Gradually Appeared Date Acquired: 07/18/2021 Weeks Of Treatment: 2 Clustered Wound: Yes Wound Measurements Length: (cm) 0 Width: (cm) 0 Depth: (cm) 0 Area: (cm) 0 Volume: (cm) 0 % Reduction in Area: 100% % Reduction in Volume: 100% Wound Description Classification: Partial Thickness Exudate Amount: Medium Exudate Type: Serosanguineous Exudate Color: red, brown Treatment Notes Wound #8 (Lower Leg) Wound Laterality: Left, Lateral Cleanser Peri-Wound Care Topical Primary Dressing Secondary Dressing Secured With Compression Wrap Compression  Stockings Add-Ons Electronic Signature(s) Signed: 08/16/2021 5:20:33 PM By: Gretta Cool, BSN, RN, CWS, Kim RN, BSN Entered By: Gretta Cool, BSN, RN, CWS, Kim on 08/15/2021 14:02:51 [name], [name] ([id number]) -------------------------------------------------------------------------------- Wound Assessment Details Patient Name: Elizabeth Landau C. Date of Service: 08/15/2021 2:00 PM Medical Record Number: [id number] Patient Account Number: [id number] Date of Birth/Sex: 06/10/1956 (66 y.o. F) Treating RN: Cornell Barman Primary Care Rudi Knippenberg: Ranae Plumber Other Clinician: Referring Takoda Siedlecki: Ranae Plumber Treating Joann Jorge/Extender: Jeri Cos Weeks in Treatment: 2 Wound Status Wound Number: 9 Primary Etiology: Lymphedema Wound Location: Right Lower Leg Wound Status: Open Wounding Event: Blister Date Acquired: 07/31/2021 Weeks Of Treatment: 1 Clustered Wound: Yes Photos Photo Uploaded By: Gretta Cool, BSN, RN, CWS, Kim on 08/15/2021 14:09:17 Wound Measurements Length: (cm) 1 Width: (cm) 1.5 Depth: (cm) 0.1 Area: (cm) 1.178 Volume: (cm) 0.118 % Reduction in Area: 90.5% % Reduction in Volume: 90.5% Wound Description Classification: Full Thickness Without Exposed Support Structu Exudate Amount: Medium Exudate Type: Serosanguineous Exudate Color: red, brown res Treatment Notes Wound #9 (Lower Leg) Wound Laterality: Right Cleanser Peri-Wound Care Topical Primary Dressing Silvercel 4 1/4x 4 1/4 (in/in) Discharge Instruction: Apply Silvercel 4 1/4x 4 1/4 (in/in) as instructed Secondary Dressing ABD Pad 5x9 (in/in) Discharge Instruction: Cover with ABD pad Secured With [name], [name] ([id number]) 5M ACE Elastic Bandage With VELCRO Brand Closure, 4 (in) 5M Medipore H Soft Cloth Surgical Tape, 2x2 (in/yd) Compression Wrap Compression Stockings Environmental education officer) Signed: 08/16/2021 5:20:33 PM By: Gretta Cool, BSN, RN, CWS, Kim RN, BSN Entered By: Gretta Cool, BSN, RN, CWS, Kim on  08/15/2021 14:02:52 [name], [name] ([id number]) -------------------------------------------------------------------------------- [place] Details Patient Name: [name]. Date of Service: 08/15/2021 2:00 PM Medical Record Number: [id number] Patient Account Number: [id number] Date of Birth/Sex: 07-05-1956 (66 y.o. F) Treating RN: Cornell Barman Primary Care Jamye Balicki: Ranae Plumber Other Clinician: Referring Albi Rappaport: Ranae Plumber Treating Nalia Honeycutt/Extender: Skipper Cliche in Treatment: 2 Vital Signs Time Taken: 13:50 Temperature (F): 98.2 Height (in): 63 Pulse (bpm): 76 Weight (lbs): 372 Respiratory Rate (breaths/min): 20 Body Mass Index (BMI): 65.9 Reference Range: 80 - 120 mg / dl Electronic Signature(s) Signed: 08/16/2021 5:20:33 PM By: Gretta Cool, BSN, RN, CWS, Kim RN, BSN Entered By: Gretta Cool, BSN, RN, CWS, Kim on 08/15/2021 13:50:32

## 2021-08-22 ENCOUNTER — Other Ambulatory Visit: Payer: Self-pay

## 2021-08-22 DIAGNOSIS — Q820 Hereditary lymphedema: Secondary | ICD-10-CM | POA: Diagnosis not present

## 2021-08-22 NOTE — Progress Notes (Signed)
MARQUERITE, FORSMAN ([id number]) Visit Report for 08/22/2021 Arrival Information Details Patient Name: [name], SCHIFFER. Date of Service: 08/22/2021 11:15 AM Medical Record Number: [id number] Patient Account Number: [id number] Date of Birth/Sex: 11-26-1955 (66 y.o. F) Treating RN: [name] Dredge Primary Care Latha Staunton: Ranae Plumber Other Clinician: Referring Vanderbilt Ranieri: Ranae Plumber Treating Dajana Gehrig/Extender: Skipper Cliche in Treatment: 3 Visit Information History Since Last Visit Added or deleted any medications: No Patient Arrived: [name] Any new allergies or adverse reactions: No Arrival Time: 11:24 Had a fall or experienced change in No Accompanied By: self activities of daily living that may affect Transfer Assistance: None risk of falls: Patient Identification Verified: Yes Hospitalized since last visit: No Secondary Verification Process Completed: Yes Has Dressing in Place as Prescribed: Yes Has Compression in Place as Prescribed: Yes Pain Present Now: No Electronic Signature(s) Signed: 08/22/2021 1:43:43 PM By: [name] Dredge Entered By: [name] Dredge on 08/22/2021 11:24:32 Ancona, [name] ([id number]) -------------------------------------------------------------------------------- Clinic Level of Care Assessment Details Patient Name: [name]. Date of Service: 08/22/2021 11:15 AM Medical Record Number: [id number] Patient Account Number: [id number] Date of Birth/Sex: 24-May-1956 (66 y.o. F) Treating RN: [name] Dredge Primary Care Rangel Echeverri: Ranae Plumber Other Clinician: Referring Germain Koopmann: Ranae Plumber Treating Lavonya Hoerner/Extender: Skipper Cliche in Treatment: 3 Clinic Level of Care Assessment Items TOOL 4 Quantity Score []  - Use when only an EandM is performed on FOLLOW-UP visit 0 ASSESSMENTS - Nursing Assessment / Reassessment []  - Reassessment of Co-morbidities (includes updates in patient status) 0 []  - 0 Reassessment of Adherence to  Treatment Plan ASSESSMENTS - Wound and Skin Assessment / Reassessment X - Simple Wound Assessment / Reassessment - one wound 1 5 []  - 0 Complex Wound Assessment / Reassessment - multiple wounds []  - 0 Dermatologic / Skin Assessment (not related to wound area) ASSESSMENTS - Focused Assessment []  - Circumferential Edema Measurements - multi extremities 0 []  - 0 Nutritional Assessment / Counseling / Intervention []  - 0 Lower Extremity Assessment (monofilament, tuning fork, pulses) []  - 0 Peripheral Arterial Disease Assessment (using hand held doppler) ASSESSMENTS - Ostomy and/or Continence Assessment and Care []  - Incontinence Assessment and Management 0 []  - 0 Ostomy Care Assessment and Management (repouching, etc.) PROCESS - Coordination of Care X - Simple Patient / Family Education for ongoing care 1 15 []  - 0 Complex (extensive) Patient / Family Education for ongoing care []  - 0 Staff obtains Programmer, systems, Records, Test Results / Process Orders []  - 0 Staff telephones HHA, Nursing Homes / Clarify orders / etc []  - 0 Routine Transfer to another Facility (non-emergent condition) []  - 0 Routine Hospital Admission (non-emergent condition) []  - 0 New Admissions / Biomedical engineer / Ordering NPWT, Apligraf, etc. []  - 0 Emergency Hospital Admission (emergent condition) X- 1 10 Simple Discharge Coordination []  - 0 Complex (extensive) Discharge Coordination PROCESS - Special Needs []  - Pediatric / Minor Patient Management 0 []  - 0 Isolation Patient Management []  - 0 Hearing / Language / Visual special needs []  - 0 Assessment of Community assistance (transportation, D/[name] planning, etc.) []  - 0 Additional assistance / Altered mentation []  - 0 Support Surface(s) Assessment (bed, cushion, seat, etc.) INTERVENTIONS - Wound Cleansing / Measurement Wegman, Tajai [name]. ([id number]) []  - 0 Simple Wound Cleansing - one wound []  - 0 Complex Wound Cleansing - multiple wounds []  -  0 Wound Imaging (photographs - any number of wounds) []  - 0 Wound Tracing (instead of photographs) []  - 0 Simple Wound Measurement -  one wound []  - 0 Complex Wound Measurement - multiple wounds INTERVENTIONS - Wound Dressings X - Small Wound Dressing one or multiple wounds 2 10 []  - 0 Medium Wound Dressing one or multiple wounds []  - 0 Large Wound Dressing one or multiple wounds []  - 0 Application of Medications - topical []  - 0 Application of Medications - injection INTERVENTIONS - Miscellaneous []  - External ear exam 0 []  - 0 Specimen Collection (cultures, biopsies, blood, body fluids, etc.) []  - 0 Specimen(s) / Culture(s) sent or taken to Lab for analysis []  - 0 Patient Transfer (multiple staff / Civil Service fast streamer / Similar devices) []  - 0 Simple Staple / Suture removal (25 or less) []  - 0 Complex Staple / Suture removal (26 or more) []  - 0 Hypo / Hyperglycemic Management (close monitor of Blood Glucose) []  - 0 Ankle / Brachial Index (ABI) - do not check if billed separately []  - 0 Vital Signs Has the patient been seen at the hospital within the last three years: Yes Total Score: 50 Level Of Care: New/Established - Level 2 Electronic Signature(s) Signed: 08/22/2021 1:43:43 PM By: [name] Dredge Entered By: [name] Dredge on 08/22/2021 11:47:09 Herringshaw, [name] ([id number]) -------------------------------------------------------------------------------- Encounter Discharge Information Details Patient Name: [name]. Date of Service: 08/22/2021 11:15 AM Medical Record Number: [id number] Patient Account Number: [id number] Date of Birth/Sex: 01-17-1956 (66 y.o. F) Treating RN: [name] Dredge Primary Care Lamel Mccarley: Ranae Plumber Other Clinician: Referring Amear Strojny: Ranae Plumber Treating Maizee Reinhold/Extender: Skipper Cliche in Treatment: 3 Encounter Discharge Information Items Discharge Condition: Stable Ambulatory Status: Cane Discharge Destination:  Home Transportation: Private Auto Accompanied By: self Schedule Follow-up Appointment: Yes Clinical Summary of Care: Electronic Signature(s) Signed: 08/22/2021 1:43:43 PM By: [name] Dredge Entered By: [name] Dredge on 08/22/2021 11:46:39 Trunnell, [name] ([id number]) -------------------------------------------------------------------------------- Wound Assessment Details Patient Name: [name]. Date of Service: 08/22/2021 11:15 AM Medical Record Number: [id number] Patient Account Number: [id number] Date of Birth/Sex: [birth year] (66 y.o. F) Treating RN: [name] Dredge Primary Care Mariella Blackwelder: Ranae Plumber Other Clinician: Referring Brisa Auth: Ranae Plumber Treating Danniell Rotundo/Extender: Skipper Cliche in Treatment: 3 Wound Status Wound Number: 9 Primary Lymphedema Etiology: Wound Location: Right Lower Leg Wound Open Wounding Event: Blister Status: Date Acquired: 07/31/2021 Comorbid Cataracts, Chronic sinus problems/congestion, Anemia, Weeks Of Treatment: 2 History: Lymphedema, Asthma, Sleep Apnea, Tuberculosis, Clustered Wound: Yes Angina, Hypertension, Gout, Osteoarthritis Wound Measurements Length: (cm) 1 Width: (cm) 1.5 Depth: (cm) 0.1 Area: (cm) 1.178 Volume: (cm) 0.118 % Reduction in Area: 90.5% % Reduction in Volume: 90.5% Epithelialization: Medium (34-66%) Tunneling: No Undermining: No Wound Description Classification: Full Thickness Without Exposed Support Structures Exudate Amount: Medium Exudate Type: Serosanguineous Exudate Color: red, brown Foul Odor After Cleansing: No Slough/Fibrino No Wound Bed Granulation Amount: Small (1-33%) Granulation Quality: Pink Necrotic Amount: None Present (0%) Treatment Notes Wound #9 (Lower Leg) Wound Laterality: Right Cleanser Peri-Wound Care Topical Primary Dressing Silvercel 4 1/4x 4 1/4 (in/in) Discharge Instruction: Apply Silvercel 4 1/4x 4 1/4 (in/in) as instructed Secondary Dressing ABD Pad  5x9 (in/in) Discharge Instruction: Cover with ABD pad Secured With 45M ACE Elastic Bandage With VELCRO Brand Closure, 4 (in) 45M Medipore H Soft Cloth Surgical Tape, 2x2 (in/yd) Compression Wrap Compression Stockings Add-Ons Electronic Signature(s) Signed: 08/22/2021 1:43:43 PM By: Holley Dexter ([id number]) Entered By: [name] Dredge on 08/22/2021 11:44:37

## 2021-08-22 NOTE — Progress Notes (Signed)
LORIEANN, ARGUETA ([id number]) Visit Report for 08/22/2021 Physician Orders Details Patient Name: KEYMANI, GLYNN. Date of Service: 08/22/2021 11:15 AM Medical Record Number: [id number] Patient Account Number: [id number] Date of Birth/Sex: [birth year] (66 y.o. F) Treating RN: [name] Dredge Primary Care Provider: Ranae Plumber Other Clinician: Referring Provider: Ranae Plumber Treating Provider/Extender: Skipper Cliche in Treatment: 3 Verbal / Phone Orders: No Diagnosis Coding Follow-up Appointments o Return Appointment in 1 week. o Nurse Visit as needed Bathing/ Shower/ Hygiene o May shower; gently cleanse wound with antibacterial soap, rinse and pat dry prior to dressing wounds Wound Treatment Wound #9 - Lower Leg Wound Laterality: Right Primary Dressing: Silvercel 4 1/4x 4 1/4 (in/in) 1 x Per Day/30 Days Discharge Instructions: Apply Silvercel 4 1/4x 4 1/4 (in/in) as instructed Secondary Dressing: ABD Pad 5x9 (in/in) 1 x Per Day/30 Days Discharge Instructions: Cover with ABD pad Secured With: 34M ACE Elastic Bandage With VELCRO Brand Closure, 4 (in) 1 x Per Day/30 Days Secured With: 34M Medipore H Soft Cloth Surgical Tape, 2x2 (in/yd) 1 x Per Day/30 Days Electronic Signature(s) Signed: 08/22/2021 1:43:43 PM By: [name] Dredge Signed: 08/22/2021 2:51:33 PM By: Worthy Keeler PA-C Entered By: [name] Dredge on 08/22/2021 11:45:34 Rinker, [name] ([id number]) -------------------------------------------------------------------------------- SuperBill Details Patient Name: [name]. Date of Service: 08/22/2021 Medical Record Number: [id number] Patient Account Number: [id number] Date of Birth/Sex: 02/21/1956 (66 y.o. 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

## 2021-08-29 ENCOUNTER — Encounter: Payer: Medicare HMO | Admitting: Physician Assistant

## 2021-08-29 ENCOUNTER — Other Ambulatory Visit: Payer: Self-pay

## 2021-08-29 DIAGNOSIS — Q820 Hereditary lymphedema: Secondary | ICD-10-CM | POA: Diagnosis not present

## 2021-08-29 NOTE — Progress Notes (Signed)
LAKELY, ELMENDORF ([id number]) Visit Report for 08/29/2021 Arrival Information Details Patient Name: [name], [name]. Date of Service: 08/29/2021 11:15 AM Medical Record Number: [id number] Patient Account Number: [id number] Date of Birth/Sex: 1956-03-25 (66 y.o. F) Treating RN: [name] Dredge Primary Care Janeese Mcgloin: [name] Other Clinician: Referring [name]: [name] Treating Jimel Myler/Extender: [name] in Treatment: 4 Visit Information History Since Last Visit Added or deleted any medications: No Patient Arrived: [name] Any new allergies or adverse reactions: No Arrival Time: 11:25 Had a fall or experienced change in No Accompanied By: self activities of daily living that may affect Transfer Assistance: None risk of falls: Patient Identification Verified: Yes Hospitalized since last visit: No Secondary Verification Process Completed: Yes Has Dressing in Place as Prescribed: Yes Pain Present Now: No Electronic Signature(s) Signed: 08/29/2021 3:53:43 PM By: [name] Dredge Entered By: [name] Dredge on 08/29/2021 11:25:26 Papania, [name] ([id number]) -------------------------------------------------------------------------------- Clinic Level of Care Assessment Details Patient Name: [name]. Date of Service: 08/29/2021 11:15 AM Medical Record Number: [id number] Patient Account Number: [id number] Date of Birth/Sex: Nov 20, 1955 (66 y.o. F) Treating RN: [name] Dredge Primary Care Jeannia Tatro: [name] Other Clinician: Referring [name]: [name] Treating Adlean Hardeman/Extender: [name] in Treatment: 4 Clinic Level of Care Assessment Items TOOL 4 Quantity Score []  - Use when only an EandM is performed on FOLLOW-UP visit 0 ASSESSMENTS - Nursing Assessment / Reassessment []  - Reassessment of Co-morbidities (includes updates in patient status) 0 []  - 0 Reassessment of Adherence to Treatment Plan ASSESSMENTS - Wound and Skin  Assessment / Reassessment X - Simple Wound Assessment / Reassessment - one wound 1 5 []  - 0 Complex Wound Assessment / Reassessment - multiple wounds []  - 0 Dermatologic / Skin Assessment (not related to wound area) ASSESSMENTS - Focused Assessment []  - Circumferential Edema Measurements - multi extremities 0 []  - 0 Nutritional Assessment / Counseling / Intervention []  - 0 Lower Extremity Assessment (monofilament, tuning fork, pulses) []  - 0 Peripheral Arterial Disease Assessment (using hand held doppler) ASSESSMENTS - Ostomy and/or Continence Assessment and Care []  - Incontinence Assessment and Management 0 []  - 0 Ostomy Care Assessment and Management (repouching, etc.) PROCESS - Coordination of Care X - Simple Patient / Family Education for ongoing care 1 15 []  - 0 Complex (extensive) Patient / Family Education for ongoing care []  - 0 Staff obtains Programmer, systems, Records, Test Results / Process Orders []  - 0 Staff telephones HHA, Nursing Homes / Clarify orders / etc []  - 0 Routine Transfer to another Facility (non-emergent condition) []  - 0 Routine Hospital Admission (non-emergent condition) []  - 0 New Admissions / Biomedical engineer / Ordering NPWT, Apligraf, etc. []  - 0 Emergency Hospital Admission (emergent condition) X- 1 10 Simple Discharge Coordination []  - 0 Complex (extensive) Discharge Coordination PROCESS - Special Needs []  - Pediatric / Minor Patient Management 0 []  - 0 Isolation Patient Management []  - 0 Hearing / Language / Visual special needs []  - 0 Assessment of Community assistance (transportation, D/C planning, etc.) []  - 0 Additional assistance / Altered mentation []  - 0 Support Surface(s) Assessment (bed, cushion, seat, etc.) INTERVENTIONS - Wound Cleansing / Measurement Hyams, [name] ([id number]) X- 1 5 Simple Wound Cleansing - one wound []  - 0 Complex Wound Cleansing - multiple wounds X- 1 5 Wound Imaging (photographs - any number  of wounds) []  - 0 Wound Tracing (instead of photographs) X- 1 5 Simple Wound Measurement - one wound []  - 0 Complex Wound  Measurement - multiple wounds INTERVENTIONS - Wound Dressings X - Small Wound Dressing one or multiple wounds 1 10 []  - 0 Medium Wound Dressing one or multiple wounds []  - 0 Large Wound Dressing one or multiple wounds []  - 0 Application of Medications - topical []  - 0 Application of Medications - injection INTERVENTIONS - Miscellaneous []  - External ear exam 0 []  - 0 Specimen Collection (cultures, biopsies, blood, body fluids, etc.) []  - 0 Specimen(s) / Culture(s) sent or taken to Lab for analysis []  - 0 Patient Transfer (multiple staff / Civil Service fast streamer / Similar devices) []  - 0 Simple Staple / Suture removal (25 or less) []  - 0 Complex Staple / Suture removal (26 or more) []  - 0 Hypo / Hyperglycemic Management (close monitor of Blood Glucose) []  - 0 Ankle / Brachial Index (ABI) - do not check if billed separately X- 1 5 Vital Signs Has the patient been seen at the hospital within the last three years: Yes Total Score: 60 Level Of Care: New/Established - Level 2 Electronic Signature(s) Signed: 08/29/2021 3:53:43 PM By: [name] Dredge Entered By: [name] Dredge on 08/29/2021 12:11:12 [name] ([id number]) -------------------------------------------------------------------------------- Encounter Discharge Information Details Patient Name: [name]. Date of Service: 08/29/2021 11:15 AM Medical Record Number: [id number] Patient Account Number: [id number] Date of Birth/Sex: [birth year] (66 y.o. F) Treating RN: [name] Dredge Primary Care Jazz Rogala: [name] Other Clinician: Referring [name]: [name] Treating [name]/Extender: [name] in Treatment: 4 Encounter Discharge Information Items Discharge Condition: Stable Ambulatory Status: Cane Discharge Destination: Home Transportation: Private Auto Accompanied  By: self Schedule Follow-up Appointment: Yes Clinical Summary of Care: Electronic Signature(s) Signed: 08/29/2021 3:53:43 PM By: [name] Dredge Entered By: [name] Dredge on 08/29/2021 12:12:02 [name] ([id number]) -------------------------------------------------------------------------------- Lower Extremity Assessment Details Patient Name: Elizabeth Landau C. Date of Service: 08/29/2021 11:15 AM Medical Record Number: [id number] Patient Account Number: [id number] Date of Birth/Sex: 01/27/1956 (65 y.o. F) Treating RN: [name] Dredge Primary Care Navi Erber: [name] Other Clinician: Referring Fleta Borgeson: [name] Treating [name]/Extender: [name] Weeks in Treatment: 4 Edema Assessment Assessed: [Left: No] [Right: No] Edema: [Left: Yes] [Right: Yes] Calf Left: Right: Point of Measurement: 39 cm From Medial Instep 84 cm 93 cm Ankle Left: Right: Point of Measurement: 9 cm From Medial Instep 47 cm 50.2 cm Vascular Assessment Pulses: Dorsalis Pedis Palpable: [Left:Yes] [Right:Yes] Electronic Signature(s) Signed: 08/29/2021 3:53:43 PM By: [name] Dredge Entered By: [name] Dredge on 08/29/2021 11:42:22 [place], [name] ([id number]) -------------------------------------------------------------------------------- Multi Wound Chart Details Patient Name: [name]. Date of Service: 08/29/2021 11:15 AM Medical Record Number: [id number] Patient Account Number: [id number] Date of Birth/Sex: 12/22/1955 (66 y.o. F) Treating RN: [name] Dredge Primary Care Kimarie Coor: [name] Other Clinician: Referring Zyra Parrillo: [name] Treating Kouper Spinella/Extender: [name] in Treatment: 4 Vital Signs Height(in): 63 Pulse(bpm): 71 Weight(lbs): 338 Blood Pressure(mmHg): Body Mass Index(BMI): 65.9 Temperature(F): 98.1 Respiratory Rate(breaths/min): 20 Photos: [N/A:N/A] Wound Location: Right Lower Leg N/A N/A Wounding Event: Blister N/A  N/A Primary Etiology: Lymphedema N/A N/A Comorbid History: Cataracts, Chronic sinus N/A N/A problems/congestion, Anemia, Lymphedema, Asthma, Sleep Apnea, Tuberculosis, Angina, Hypertension, Gout, Osteoarthritis Date Acquired: 07/31/2021 N/A N/A Weeks of Treatment: 3 N/A N/A Wound Status: Open N/A N/A Wound Recurrence: No N/A N/A Clustered Wound: Yes N/A N/A Measurements L x W x D (cm) 1.2x1x0.1 N/A N/A Area (cm) : 0.942 N/A N/A Volume (cm) : 0.094 N/A N/A % Reduction in Area: 92.40% N/A N/A % Reduction in  Volume: 92.40% N/A N/A Classification: Full Thickness Without Exposed N/A N/A Support Structures Exudate Amount: Medium N/A N/A Exudate Type: Serosanguineous N/A N/A Exudate Color: red, brown N/A N/A Granulation Amount: Small (1-33%) N/A N/A Granulation Quality: Pink N/A N/A Necrotic Amount: None Present (0%) N/A N/A Epithelialization: Medium (34-66%) N/A N/A Treatment Notes Electronic Signature(s) Signed: 08/29/2021 3:53:43 PM By: [name] Dredge Entered By: [name] Dredge on 08/29/2021 11:58:15 [place], [name] ([id number]) -------------------------------------------------------------------------------- [place] Details Patient Name: [name]. Date of Service: 08/29/2021 11:15 AM Medical Record Number: [id number] Patient Account Number: [id number] Date of Birth/Sex: 1956/05/11 (66 y.o. F) Treating RN: [name] Dredge Primary Care Erlin Gardella: [name] Other Clinician: Referring Talib Headley: [name] Treating Janara Klett/Extender: [name] in Treatment: 4 Active Inactive Orientation to the Wound Care Program Nursing Diagnoses: Knowledge deficit related to the wound healing center program Goals: Patient/caregiver will verbalize understanding of the [place] Program Date Initiated: 08/01/2021 Target Resolution Date: 08/01/2021 Goal Status: Active Interventions: Provide education on orientation to the wound  center Notes: Wound/Skin Impairment Nursing Diagnoses: Impaired tissue integrity Knowledge deficit related to ulceration/compromised skin integrity Goals: Ulcer/skin breakdown will have a volume reduction of 30% by week 4 Date Initiated: 08/01/2021 Target Resolution Date: 08/29/2021 Goal Status: Active Ulcer/skin breakdown will have a volume reduction of 50% by week 8 Date Initiated: 08/01/2021 Target Resolution Date: 09/26/2021 Goal Status: Active Ulcer/skin breakdown will have a volume reduction of 80% by week 12 Date Initiated: 08/01/2021 Target Resolution Date: 10/24/2021 Goal Status: Active Ulcer/skin breakdown will heal within 14 weeks Date Initiated: 08/01/2021 Target Resolution Date: 11/07/2021 Goal Status: Active Interventions: Assess patient/caregiver ability to obtain necessary supplies Assess patient/caregiver ability to perform ulcer/skin care regimen upon admission and as needed Assess ulceration(s) every visit Provide education on ulcer and skin care Notes: Electronic Signature(s) Signed: 08/29/2021 3:53:43 PM By: [name] Dredge Previous Signature: 08/29/2021 11:54:47 AM Version By: [name] Dredge Entered By: [name] Dredge on 08/29/2021 11:58:02 [place], [name] ([id number]) -------------------------------------------------------------------------------- Pain Assessment Details Patient Name: [name]. Date of Service: 08/29/2021 11:15 AM Medical Record Number: [id number] Patient Account Number: [id number] Date of Birth/Sex: 08-25-1955 (66 y.o. F) Treating RN: [name] Dredge Primary Care Kharter Sestak: [name] Other Clinician: Referring Parsa Rickett: [name] Treating [name]/Extender: [name] in Treatment: 4 Active Problems Location of Pain Severity and Description of Pain Patient Has Paino No Site Locations Rate the pain. Current Pain Level: 0 Pain Management and Medication Current Pain Management: Electronic Signature(s) Signed:  08/29/2021 3:53:43 PM By: [name] Dredge Entered By: [name] Dredge on 08/29/2021 11:29:17 Liaw, [name] ([id number]) -------------------------------------------------------------------------------- Patient/Caregiver Education Details Patient Name: [name]. Date of Service: 08/29/2021 11:15 AM Medical Record Number: [id number] Patient Account Number: [id number] Date of Birth/Gender: 1955/11/18 (66 y.o. F) Treating RN: [name] Dredge Primary Care Physician: [name] Other Clinician: Referring Physician: Ranae [name] Treating Physician/Extender: [name] in Treatment: 4 Education Assessment Education Provided To: Patient Education Topics Provided Wound/Skin Impairment: Handouts: Caring for Your Ulcer Responses: State content correctly Electronic Signature(s) Signed: 08/29/2021 3:53:43 PM By: [name] Dredge Entered By: [name] Dredge on 08/29/2021 12:11:30 [name] ([id number]) -------------------------------------------------------------------------------- Wound Assessment Details Patient Name: Elizabeth Landau C. Date of Service: 08/29/2021 11:15 AM Medical Record Number: [id number] Patient Account Number: [id number] Date of Birth/Sex: Aug 24, 1955 (66 y.o. F) Treating RN: [name] Dredge Primary Care Roney Youtz: [name] Other Clinician: Referring Niguel Moure: [name] Treating [name]/Extender: [name] in Treatment: 4 Wound Status Wound  Number: 9 Primary Lymphedema Etiology: Wound Location: Right Lower Leg Wound Open Wounding Event: Blister Status: Date Acquired: 07/31/2021 Comorbid Cataracts, Chronic sinus problems/congestion, Anemia, Weeks Of Treatment: 3 History: Lymphedema, Asthma, Sleep Apnea, Tuberculosis, Angina, Clustered Wound: Yes Hypertension, Gout, Osteoarthritis Photos Wound Measurements Length: (cm) 1.2 Width: (cm) 1 Depth: (cm) 0.1 Area: (cm) 0.942 Volume: (cm) 0.094 % Reduction in Area:  92.4% % Reduction in Volume: 92.4% Epithelialization: Medium (34-66%) Tunneling: No Undermining: No Wound Description Classification: Full Thickness Without Exposed Support Structu Exudate Amount: Medium Exudate Type: Serosanguineous Exudate Color: red, brown res Foul Odor After Cleansing: No Slough/Fibrino No Wound Bed Granulation Amount: Small (1-33%) Granulation Quality: Pink Necrotic Amount: None Present (0%) Treatment Notes Wound #9 (Lower Leg) Wound Laterality: Right Cleanser Peri-Wound Care Topical Primary Dressing Silvercel 4 1/4x 4 1/4 (in/in) Discharge Instruction: Apply Silvercel 4 1/4x 4 1/4 (in/in) as instructed EBELYN, BOHNET ([id number]) Secondary Dressing ABD Pad 5x9 (in/in) Discharge Instruction: Cover with ABD pad Secured With 65M ACE Elastic Bandage With VELCRO Brand Closure, 4 (in) 65M Medipore H Soft Cloth Surgical Tape, 2x2 (in/yd) Compression Wrap Compression Stockings Add-Ons Electronic Signature(s) Signed: 08/29/2021 3:53:43 PM By: [name] Dredge Entered By: [name] Dredge on 08/29/2021 11:43:06 [place], [name] ([id number]) -------------------------------------------------------------------------------- Vitals Details Patient Name: [name]. Date of Service: 08/29/2021 11:15 AM Medical Record Number: [id number] Patient Account Number: [id number] Date of Birth/Sex: April 09, 1956 (66 y.o. F) Treating RN: [name] Dredge Primary Care Hendrix Console: [name] Other Clinician: Referring [place] Searles: [name] Treating Rane Dumm/Extender: [name] in Treatment: 4 Vital Signs Time Taken: 11:25 Temperature (F): 98.1 Height (in): 63 Pulse (bpm): 71 Weight (lbs): 372 Respiratory Rate (breaths/min): 20 Body Mass Index (BMI): 65.9 Reference Range: 80 - 120 mg / dl Electronic Signature(s) Signed: 08/29/2021 3:53:43 PM By: [name] Dredge Entered By: [name] Dredge on 08/29/2021 11:29:05

## 2021-08-29 NOTE — Progress Notes (Addendum)
BEYOUNCE, DICKENS ([id number]) Visit Report for 08/29/2021 Chief Complaint Document Details Patient Name: Elizabeth, [name]. Date of Service: 08/29/2021 11:15 AM Medical Record Number: [id number] Patient Account Number: [id number] Date of Birth/Sex: 30-Jun-1956 (66 y.o. F) Treating RN: [name] Dredge Primary Care Provider: Ranae Plumber Other Clinician: Referring Provider: Ranae Plumber Treating Provider/Extender: Skipper Cliche in Treatment: 4 Information Obtained from: Patient Chief Complaint Bilateral LE lymphedema with Left LE Ulcers Electronic Signature(s) Signed: 08/29/2021 11:57:40 AM By: Worthy Keeler PA-C Entered By: Worthy Keeler on 08/29/2021 11:57:40 [name], [name] ([id number]) -------------------------------------------------------------------------------- HPI Details Patient Name: [name] Date of Service: 08/29/2021 11:15 AM Medical Record Number: [id number] Patient Account Number: [id number] Date of Birth/Sex: 10/23/1955 (66 y.o. F) Treating RN: [name] Dredge Primary Care Provider: Ranae Plumber Other Clinician: Referring Provider: Ranae Plumber Treating Provider/Extender: Skipper Cliche in Treatment: 4 History of Present Illness HPI Description: The patient is a 66 year old female with history of hypertension and a long-standing history of bilateral lower extremity lymphedema (first presented on 4/2) . She has had open ulcers in the past which have always responded to compression therapy. She had briefly been to a lymphedema clinic in the past which helped her at the time. this time around she stopped treatment of her lymphedema pumps approximately 2 weeks ago because of some pain in the knees and then noticed the right leg getting worse. She was seen by her PCP who put her on clindamycin 4 times a day 2 days ago. The patient has seen [facility] and Dr. Delana Meyer had seen her last year where a vascular study including venous and arterial duplex  studies were within normal limits. he had recommended compression stockings and lymphedema pumps and the patient has been using this in about 2 weeks ago. She is known to be diabetic but in the past few time she's gone to her primary care doctor her hemoglobin A1c has been normal. 02/11/2015 - after her last visit she took my advice and went to the ER regarding the progressive cellulitis of her right lower extremity and she was admitted between July 17 and 22nd. She received IV antibiotics and then was sent home on a course of steroid-induced and oral antibiotics. She has improved much since then. 02/17/2015 -- she has been doing fine and the weeping of her legs has remarkably gone down. She has no fresh issues. READMISSION 01/15/18 This patient was given this clinic before most recently in 2016 seen by Dr. Con Memos. She has massive bilateral lymphedema and over the last 2 months this had weeping edema out of the left leg. She has compression pumps but her compliance with these has been minimal. She has [facility] they've been using TCA/ABDs/kerlix under an Ace wrap.she has had recent problems with cellulitis. She was apparently seen in the ER and 12/23/17 and given clindamycin. She was then followed by her primary doctor and given doxycycline and Keflex. The pain seems to have settled down. In April 2018 the patient had arterial studies done at [facility]. This showed triphasic waveforms throughout the right leg and mostly triphasic waveforms on the left except for monophasic at the posterior tibial artery distally. She was not felt to have evidence of right lower extremity arterial stenosis or significant problems on the left side. 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 She also had a right lower extremity venous Doppler in January 2018 this was limited by the patient's body  habitus and lymphedema. Most of the proximal veins were not  visualized The patient presents with an area of denuded skin on the anterior medial part of the left calf. There is weeping edema fluid here. 01/22/18; the patient has somewhat better edema control using her compression pumps twice a day and as a result she has much better epithelialization on the left anterior calf area. Only a small open area remains. 01/29/18; the patient has been compliant with her compression pumps. Both the areas on her calf that healed. The remaining area on the left anterior leg is fully epithelialized Readmission: 02/20/2019 upon evaluation today patient presents for reevaluation due to issues that she is having with the bilateral lower extremities. She actually has wounds open on both legs. On the right she has an area in the crease of her leg on the right around the knee region which is actually draining quite a bit and actually has some fungal type appearance to it. She has been on nystatin powder that seems to have helped to some degree. In regard to the left lower extremity this is actually in the lower portion of her leg closer to the ankle and again is continuing to drain as well unfortunately. There does not appear to be any signs of active infection at this time which is good news. No fevers, chills, nausea, vomiting, or diarrhea. She tells me that since she was seen last year she is actually been doing quite well for the most part with regard to her lower extremities. Unfortunately she now is experiencing a little bit more drainage at this time. She is concerned about getting this under control so that it does not get significantly worse. 02/27/2019 on evaluation today patient appears to be doing somewhat better in regard to her bilateral lower extremity wounds. She has been tolerating the dressing changes without complication. Fortunately there is no signs of active infection at this point. No fevers, chills, nausea, vomiting, or diarrhea. She did get her dressing  supplies which is excellent news she was extremely excited to get these. She also got paperwork from [employer] for their financial assistance program where they may be able to help her out in the future if needed with supplies at discounted prices. 03/06/2019 on evaluation today patient appears to be doing a little worse with regard to both areas of weeping on her bilateral lower extremities. This is around the right medial knee and just above the left ankle. With that being said she is unfortunately not doing as well as I would like to see. I feel like she may need to potentially go see someone at the lymphedema clinic as the wraps that she needs or even beyond what we can do here at the wound care center. She really does not have wounds she just has open areas of weeping that are causing some difficulty for her. Subsequently because of this and the moisture I am concerned about the potential for infection I am going to likely give her a prophylactic antibiotic today, Keflex, just to be on the safe side. Nonetheless again there is no obvious signs of active infection at this time. 03/13/2019 on evaluation today patient appears to be doing well with regard to her bilateral lower extremities where she has been weeping compared to even last week's evaluation. I see some areas of new skin growth which is excellent and overall I am very pleased with how things seem to be progressing. No fevers, chills, nausea, vomiting, or diarrhea. [name],  [name] ([id number]) 03/20/2019 on evaluation today patient unfortunately is continuing to have issues with significant edema of the left lower extremity. Her right side seems to be doing much better. Unfortunately her left side is showing increased weeping of the lower portion of her leg. This is quite unfortunate obviously we were hoping to get her into the lymphedema clinic they really do not seem to when I see her how if she is draining. Despite the fact this is really  not wound related but more lymphedema weeping related. Nonetheless I do not know that this can be helpful for her to even go for that appointment since again I am not sure there is much that they would actually do at this point. We may need to try a 4 layer compression wrap as best we can on her leg. She is on the Augmentin currently although I am still concerned about whether or not there could be potentially something going on infection wise I would obtain a culture though I understand is not the best being that is a surface culture I just 1 to make sure I do not seem to be missing anything. 03/27/2019 on evaluation today patient appears to be doing much better in regard to the left lower extremity compared to last week. Last week she had tremendous weeping which I think was subsequent to infection now she seems to be doing much better and very pleased. This is not completely healed but there is a lot of new skin growth and it has dried out quite a bit. Overall I think that we are doing well with how things are moving along at this time. No fevers, chills, nausea, vomiting, or diarrhea. 04/03/2019 evaluation today patient appears to be doing a little worse this week compared to last time I saw her. I think this may be due to the fact that she is having issues with not being able to sleep in her bed at least not until last night. She is therefore been in a lift chair and subsequently has also had issues with not been able to use her pumps since she could not get in bed. With that being said the patient overall seems to be doing okay I do think I may want extend the antibiotic for a little bit longer at least until we can see if her edema and her weeping gets better and if it is then obviously I can always discontinue the antibiotics as of next week however I want her to continue to have it over the next week. 04/10/2019 on evaluation today patient unfortunately is still doing poorly with regard to her  left lower extremity. Her right is all things considering doing fairly well. On the left however she continues to have spreading of the area of infection and weeping which appears to be even a larger surface area than noted last week. She did have a positive culture for Pseudomonas in particular which seems to have been of concern she still has green/yellow discharge consistent with Pseudomonas and subsequently a tremendous amount of it. This has me obviously still concerned about the infection not really clearing up despite the fact that on culture it appears the Cipro should have been a good option for treating this. I think she may at this point need IV antibiotics since things are not doing better I do not want to get worse and cause sepsis. She is in agreement with the plan and believes as well that she likely does need  to go to the hospital for IV vancomycin. Or something of the like depending on what the recommendation is from the ER. 04/17/2019 on evaluation today patient appears to be doing excellent in regard to her lower extremity on the left. She was in the hospital for several days from when I sent her last we saw her until just this past Tuesday. Fortunately her drainage is significantly improved and in fact is mostly clear. There is just a couple small areas that may still drain a little bit she states that the [facility] they prescribed for her at discharge she went picked up from pharmacy and got home but has not been able to find it since. She is looked everywhere. She is wondering if I will replace that for her today I will be more than happy to do that. 05/01/2019 on evaluation today patient actually appears to be doing quite well with regard to her lower extremities. She occasionally is having areas that will leak and then heal up mainly when a piece of the fibrotic skin pops off but fortunately she is not having any signs of active infection at this time. Overall she also really does  not have any obvious weeping at this time. I do believe however she really needs some compression wraps and I think this may be a good time to get her back to the lymphedema clinic. 05/11/2019 on evaluation today patient actually appears to be doing quite well with regard to her bilateral lower extremities. She occasionally will have a small area that we per another but in general seems to be completely healed which is great news. Overall very pleased with how everything seems to be progressing. She does have her appointment with lymphedema clinic on November 18. 05/25/2019 on evaluation today patient appears to be doing well with regard to her left lower extremity. I am very pleased in this regard. In regard to her right leg this actually did start draining more I think it is mainly due to the fact that her leg is more swollen. I am not seeing any obvious signs of infection at this time although that is definitely something were obviously acutely aware of simply due to the fact that she had an issue not too far back with exactly this issue. Nonetheless I do feel like that lymphedema clinic would still be beneficial for her. I explained obviously if they are not able to do anything treatment wise on the right leg we could at least have them treat her left leg and then proceed from there. The patient is really in agreement with that plan. If they are able to do both as the drainage slows down that I would be happy to let them handle both. 06/01/2019 on evaluation today patient unfortunately appears to be doing worse with regard to her right lower extremity. The left lower extremity is still maintaining at this point. Unfortunately she has been having significantly increased pain over the past several days and has been experiencing as well increased swelling of the right lower extremity. I really do not know that I am seeing anything that appears to be obvious for infection at this point to be  peripherally honest. With that being said the patient does seem to be having much more swelling that she is even experienced in the past and coupled with increased pain in her hip as well I am concerned that again she could potentially have a DVT although I am not 100% sure of this. I think  it something that may need to be checked out. We discussed the possibility of sending her for a DVT study through the hospital but unfortunately transportation is an issue if she does have a DVT I do not want her to wait days to be able to get in for that test however if she has this scheduled as an outpatient that is as fast that she will be able to get the test scheduled for transportation purposes. That will also fall on Thanksgiving so subsequently she did actually be looking at either Friday or even next week before we would know anything back from this. That is much too long in my opinion. Subsequent to the amount of discomfort she is experiencing the patient is actually okay with going to the ER for evaluation today. 06/12/2019 on evaluation today patient actually appears to be doing significantly better compared to last time I saw her. Following when I last saw her she was actually in the hospital from that Monday until the following Sunday almost 1 full week. She actually was placed on Keflex in the hospital following the time for her to be discharged and Dr. Steva Ready has recommended 2 times a day dosing of the Keflex for the next year in order to help with more prophylactic/preventative measures with regard to her developing cellulitis. Overall I think this sounds like an excellent plan. The patient unfortunately is good to have trouble being treated at lymphedema clinic due to the fact that she really cannot get up on the bed that they have there. They also state that they cannot manage her as long as she has anything draining at this point. Obviously that is somewhat unfortunate as she does need help  with edema control but nonetheless we will have to do what we can for her outside of it sounds like the lymphedema clinic scenario at this point. 06/19/2019 on evaluation today patient appears to be doing fairly well with regard to her bilateral lower extremities. She is not nearly as swollen and shows no signs of infection at this point. There is no evidence of cellulitis whatsoever. She also has no open wounds or draining at this point which is also good news. No fever chills noted. She seems to be in very good spirits and in fact appears to be doing quite well. READMISSION 11/27/2019 [name], [name] ([id number]) This is a 66 year old woman that we have had in this clinic several times before including 2015, 16 and 19 and then most recently from 03/20/2019 through 06/19/2019 with bilateral lower extremity lymphedema. She has had previous arterial and reflux studies done years ago which were not all that remarkable. 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 She does have a positive family history and she had large legs starting may be in her 72s. She was recently in hospital from 10/20/2019 through 10/28/2019 with right leg cellulitis. She was given Ancef and clindamycin and then Zosyn when a culture showed Pseudomonas. At that time there was purulent drainage. She was followed by infectious disease Dr Steva Ready. The patient is now back at home. She has noted increased swelling in the right and no drainage in her right leg mostly on the posterior medial aspect in the calf area. She has not had pain or fever. She has literally been improved lysing above dressings because her at the area of this is far too large for standard compression. She has been wrapping the areas with sheets to resorptive  pads. She is found these helped somewhat. She does have an appointment with the lymphedema clinic in [place] in late June. Past medical history  includes bilateral lymphedema, hypertension, obstructive sleep apnea with CPAP. Recent hospitalization with apparently Pseudomonas cellulitis of the right lower leg 12/15/2019 upon evaluation today patient appears to be doing a little bit worse in regard to her right lower extremity. Unfortunately she is having more weeping down in the lower portion of her leg. Fortunately there is no signs of active infection at this time. No fever chills noted. The patient states she is not having increased pain except for when she attempted to use the lymphedema pumps unfortunately she states that she did have pain when she did this. Otherwise we been using absorptive dressings of one type or another she is using diapers at home and then subsequently Ace wraps. In regard to the barrier cream we have discussed the possibility of derma cloud which she would like to try I do not have a problem with that. 12/22/2019 upon evaluation today patient actually appears to be doing better in regard to her leg ulcers at this point. Fortunately there does not appear to be any signs of active infection which is great news and I am extremely pleased with where things are progressing at this time. There is no sign of active infection currently. The patient is very pleased to see things doing so well. 12/29/2019 upon evaluation today patient appears to be doing a little bit better in regard to her weeping in general over her lower extremities. She does have some signs of mild erythema little bit more than what I noted last week or rather last visit. Nonetheless I think that my threshold for switching her antibiotics from Keflex to something else is very low at this point considering that she has had such severe infections in the past that seem to come almost out of nowhere. There is a little erythema and warmth noted of the lower portion of her leg compared to the upper which also makes me want to go ahead and address things more  rapidly at this point. Likely I would switch out the Keflex for something like Levaquin ideally. 7/16; patient with severe bilateral lymphedema. She has superficial wounds albeit almost circumferential now on the left lateral lower leg. This may be new from last time. Small area on the right anterior lower leg and then another area on the right medial lower leg and of pannus fold. She has been using various absorptive garments. She states she is using her compression pumps once a day occasionally twice. Culture from her last visit here was negative 01/29/2020 on evaluation today patient appears to be doing excellent at this point in regard to her legs with regard to infection I see no signs of active infection at this point. She still does have unfortunately areas of weeping this is minimal on the right now her left is actually significantly worse although I do not think it is as bad as last week with Dr. Dellia Nims saw her. She has been trying to pump and elevate her legs is much as possible. She has previously been on the Keflex and in the past for prevention that seems to do fairly well and likely can extend that today. 02/04/2020 on evaluation today patient appears to be doing better in regard to her legs bilaterally. Fortunately there is no signs of active infection at this time which is great news and overall she has less weeping  on the left compared to the right and there is several spots where she is pretty much sealed up with no draining regions. Overall very pleased in this regard. 02/19/2020 on evaluation today patient appears to be doing very well in regard to her wounds currently. Fortunately there is no evidence of active infection overall very pleased with where things stand. She is significantly improved in regard to her edema I am extremely pleased in this regard she tells me that the popping no longer hurts and in fact she actually looks forward to it. 03/04/2020 on evaluation today patient  appears to be doing excellent in regard to her lower extremities. Fortunately there is no signs of active infection at this time. No fevers, chills, nausea, vomiting, or diarrhea. 03/25/2020 on evaluation today patient appears to be doing a little bit more poorly in regard to her legs at this point. She tells me that she is still continue to have issues with drainage and this has been a little bit worse she was getting ready to start taking the Keflex again but wanted to see me first. Fortunately there is no signs of active infection at this time. No fevers, chills, nausea, vomiting, or diarrhea. 04/15/2020 upon evaluation today patient appears to be doing somewhat poorly in regard to her right leg. She tells me she has been having more pain she has been taking the Keflex that was previously prescribed unfortunately that just does not seem to help with this. She was hoping that the pain on her right was actually coming from the fact that she was having issues with her wrap having gotten caught in her recliner. With that being said she tells me that she knew something was not right. Currently her right leg is warm to touch along with being erythematous all the way up to around at least mid thigh as far as I can see. The left leg does not appear to be doing that badly though there is increased weeping around the ankle region. 05/06/2020 on evaluation today patient appears to be doing much better than last time I saw her. She did go to the hospital where she was admitted for 2 days and treated with antibiotic therapy. She was discharged with antibiotics as well and has done extremely well. I am extremely pleased with where things stand today. There is no signs of active infection at this time which is great news. 05/27/2020 upon evaluation today patient appears to be doing well with regard to her lower extremities bilaterally. She has just a very tiny area on the right leg which is opening on the left leg  she is significantly improved though she still has several areas that do appear to be open this is minimal compared to what is been in the past. In general I am extremely pleased with where things stand today. The patient does tell me she is not been using her pumps quite as much as she should be. I do believe that is 1 area she can definitely work on. She has had a lot going on including a Covid exposure and apparently also a outbreak of likely shingles. 06/24/2020 upon evaluation today patient appears to be doing well with regard to her legs in general although the left leg unfortunately is showing some signs of erythema she does have a little bit of increased weeping and to be honest I am concerned about infection here. I discussed that with her today and I think that we may need to address  this sooner rather than later she has been taking Keflex she is not really certain that is been making a big improvement however. No fevers, chills, nausea, vomiting, or diarrhea. [name], [name] ([id number]) 06/30/2020 upon evaluation today patient's legs actually seem to be doing better in my opinion as compared to where they were last week. Fortunately there does not appear to be any signs of active infection. Her culture showed multiple organisms nothing predominate. With that being said the Levaquin seems to have done well I think she has improved since I last saw her as well. 07/14/2020 upon evaluation today patient appears to be doing actually better in regard to her lower extremities in my opinion. She has been tolerating the dressing changes without complication. Fortunately there is no signs of active infection at this time. 08/04/2020 upon evaluation today patient appears to be doing excellent in regard to her leg ulcers. Fortunately she has very little that is open at this point. This is great news. In fact I think that the Goldbond medicated powder has been excellent for her. It seems to have done the  trick where we had tried several other things without as much success. Fortunately there is no evidence of active infection at this time. No fevers, chills, nausea, vomiting, or diarrhea. 09/01/2020 upon evaluation today patient appears to be doing a little bit more poorly than the last time I saw her. She tells me right now that she has been having a lot of drainage compared to where things were previous which has unfortunately led to more irritation as well. She is concerned that this is leading to infection. Again based on what I am seeing today as well I am also concerned of the same to be honest. 09/15/2020 upon evaluation today patient appears to be doing well with regard to her legs compared to what I saw previous. Fortunately there does not appear to be any evidence of active infection at this time which is great news. At least not as badly as what it was previous. With that being said I do believe that the patient does need to have an extension of the antibiotics. This I believe will actually help her more in the way of making sure this stays under control and does not worsen. That was the Bactrim DS. Readmission: 08/01/2021 upon evaluation today patient appears to be doing actually pretty well all things considered. Is actually been almost a year since have seen her last March. Fortunately I do not see any evidence of active infection locally nor systemically at this point. She does have a couple open areas on the left leg the right leg appears to be doing quite well. In general she has been in the hospital 3 times since I saw her a year ago all 4 cellulitis type issues except for the last 1 which was actually more related to congestive heart failure for that reason she is not using her lymphedema pumps at this point and that is probably the best idea. 08/08/2021 upon evaluation today patient appears to be doing well with regard to her legs all things considered her left leg may be a little bit  more swollen based on what I see today. Fortunately I do not see any signs of active infection locally nor systemically at this time which is great news. No fevers, chills, nausea, vomiting, or diarrhea. 08/15/2021 upon evaluation today patient actually appears to be doing excellent in regard to her wounds. She has been tolerating the dressing  changes without complication. Fortunately I see no evidence of infection currently which is great news. No fevers, chills, nausea, vomiting, or diarrhea. 08/29/2021 upon evaluation patient appears to be doing excellent she in fact is almost completely healed. I am actually very pleased with where we stand today and I think that she is making wonderful progress she just has a small area of weeping on the right lateral leg everything else is pretty much completely healed which is also. Electronic Signature(s) Signed: 08/29/2021 4:35:40 PM By: Worthy Keeler PA-C Entered By: Worthy Keeler on 08/29/2021 16:35:40 [name], [name] ([id number]) -------------------------------------------------------------------------------- Physical Exam Details Patient Name: DESHAY, KIRSTEIN C. Date of Service: 08/29/2021 11:15 AM Medical Record Number: [id number] Patient Account Number: [id number] Date of Birth/Sex: 07/19/1955 (66 y.o. F) Treating RN: [name] Dredge Primary Care Provider: Ranae Plumber Other Clinician: Referring Provider: Ranae Plumber Treating Provider/Extender: Skipper Cliche in Treatment: 4 Constitutional Obese and well-hydrated in no acute distress. Respiratory normal breathing without difficulty. Psychiatric this patient is able to make decisions and demonstrates good insight into disease process. Alert and Oriented x 3. pleasant and cooperative. Notes Upon inspection again patient has small area of weeping over the right lateral leg inferiorly. There does not appear to be any signs of infection and overall I think were very close to get her  back to her normal. Electronic Signature(s) Signed: 08/29/2021 4:36:04 PM By: Worthy Keeler PA-C Entered By: Worthy Keeler on 08/29/2021 16:36:04 [name], [name] ([id number]) -------------------------------------------------------------------------------- Physician Orders Details Patient Name: [name]. Date of Service: 08/29/2021 11:15 AM Medical Record Number: [id number] Patient Account Number: [id number] Date of Birth/Sex: [birth year] (66 y.o. 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 o Nurse Visit as needed Bathing/ Shower/ Hygiene o May shower; gently cleanse wound with antibacterial soap, rinse and pat dry prior to dressing wounds Wound Treatment Wound #9 - Lower Leg Wound Laterality: Right Primary Dressing: Silvercel 4 1/4x 4 1/4 (in/in) 1 x Per Day/30 Days Discharge Instructions: Apply Silvercel 4 1/4x 4 1/4 (in/in) as instructed Secondary Dressing: ABD Pad 5x9 (in/in) 1 x Per Day/30 Days Discharge Instructions: Cover with ABD pad Secured With: 26M ACE Elastic Bandage With VELCRO Brand Closure, 4 (in) 1 x Per Day/30 Days Secured With: 26M Medipore H Soft Cloth Surgical Tape, 2x2 (in/yd) 1 x Per Day/30 Days Electronic Signature(s) Signed: 08/29/2021 3:53:43 PM By: [name] Dredge Signed: 08/29/2021 4:50:19 PM By: Worthy Keeler PA-C Entered By: [name] Dredge on 08/29/2021 12:10:17 [name], [name]  ([id number]) -------------------------------------------------------------------------------- Problem List Details Patient Name: Elizabeth Landau C. Date of Service: 08/29/2021 11:15 AM Medical Record Number: [id number] Patient Account Number: [id number] Date of Birth/Sex: 07/31/1955 (66 y.o. 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 Date of Service: 08/29/2021 11:15 AM Medical Record Number: [id number] Patient Account Number: [id number] Date of Birth/Sex: Aug 11, 1955 (66 y.o. F) Treating RN: [name] Dredge Primary Care Provider: Ranae Plumber Other Clinician: Referring Provider: Ranae Plumber Treating Provider/Extender: Skipper Cliche in Treatment: 4 Subjective Chief Complaint Information obtained from Patient Bilateral LE lymphedema with Left LE Ulcers History of Present Illness (HPI) The patient is a 66 year old female with history of hypertension and a long-standing history of bilateral lower extremity lymphedema (first presented on 4/2) . She has had open ulcers in the past which have always responded to compression therapy. She had briefly been to a lymphedema  clinic in the past which helped her at the time. this time around she stopped treatment of her lymphedema pumps approximately 2 weeks ago because of some pain in the knees and then noticed the right leg getting worse. She was seen by her PCP who put her on clindamycin 4 times a day 2 days ago. The patient has seen [facility] and Dr. Delana Meyer had seen her last year where a vascular study including venous and arterial duplex studies were within normal limits. he had recommended compression stockings and lymphedema pumps and the patient has been using this in about 2 weeks ago. She is known to be diabetic but in the past few time she's gone to her primary care doctor her hemoglobin A1c has been normal. 02/11/2015 - after her last visit she took my advice and went to the ER regarding the progressive cellulitis of her right lower extremity and she was admitted between July 17 and 22nd. She received IV antibiotics and then was sent home on a course of steroid-induced and oral antibiotics. She has improved much since then. 02/17/2015 -- she has been doing fine and the weeping of her legs has remarkably gone down. She has no fresh issues. READMISSION 01/15/18 This patient was given this clinic before most recently in 2016 seen by Dr. Con Memos. She has massive bilateral lymphedema and over the last 2 months this had weeping edema out of the left leg. She has compression pumps but her compliance with these has been minimal. She has [facility] they've been using TCA/ABDs/kerlix under an Ace wrap.she has had recent problems with cellulitis. She was apparently seen in the ER and 12/23/17 and given clindamycin. She was then followed by her primary doctor and given doxycycline and Keflex. The pain seems to have settled down. In April 2018 the patient had arterial studies done at [facility]. This showed triphasic waveforms throughout the right leg and mostly triphasic waveforms on the left except for  monophasic at the posterior tibial artery distally. She was not felt to have evidence of right lower extremity arterial stenosis or significant problems on the left side. 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 She also had a right lower extremity venous Doppler in January 2018 this was limited by the patient's body habitus and lymphedema. Most of the proximal veins were not visualized The patient presents with an area of denuded skin on the anterior medial part of the left calf. There is weeping edema fluid here. 01/22/18; the patient has somewhat better edema  control using her compression pumps twice a day and as a result she has much better epithelialization on the left anterior calf area. Only a small open area remains. 01/29/18; the patient has been compliant with her compression pumps. Both the areas on her calf that healed. The remaining area on the left anterior leg is fully epithelialized Readmission: 02/20/2019 upon evaluation today patient presents for reevaluation due to issues that she is having with the bilateral lower extremities. She actually has wounds open on both legs. On the right she has an area in the crease of her leg on the right around the knee region which is actually draining quite a bit and actually has some fungal type appearance to it. She has been on nystatin powder that seems to have helped to some degree. In regard to the left lower extremity this is actually in the lower portion of her leg closer to the ankle and again is continuing to drain as well unfortunately. There does not appear to be any signs of active infection at this time which is good news. No fevers, chills, nausea, vomiting, or diarrhea. She tells me that since she was seen last year she is actually been doing quite well for the most part with regard to her lower extremities. Unfortunately she now is experiencing a little bit more drainage at this time. She is concerned about  getting this under control so that it does not get significantly worse. 02/27/2019 on evaluation today patient appears to be doing somewhat better in regard to her bilateral lower extremity wounds. She has been tolerating the dressing changes without complication. Fortunately there is no signs of active infection at this point. No fevers, chills, nausea, vomiting, or diarrhea. She did get her dressing supplies which is excellent news she was extremely excited to get these. She also got paperwork from [employer] for their financial assistance program where they may be able to help her out in the future if needed with supplies at discounted prices. 03/06/2019 on evaluation today patient appears to be doing a little worse with regard to both areas of weeping on her bilateral lower extremities. This is around the right medial knee and just above the left ankle. With that being said she is unfortunately not doing as well as I would like to see. I feel like she may need to potentially go see someone at the lymphedema clinic as the wraps that she needs or even beyond what we can do here at the wound care center. She really does not have wounds she just has open areas of weeping that are causing some difficulty for her. Subsequently because of this and the moisture I am concerned about the potential for infection I am going to likely give her a prophylactic antibiotic today, Keflex, just to be on the safe side. Nonetheless again there is no obvious signs of active infection at this time. ARAYLA, KRUSCHKE ([id number]) 03/13/2019 on evaluation today patient appears to be doing well with regard to her bilateral lower extremities where she has been weeping compared to even last week's evaluation. I see some areas of new skin growth which is excellent and overall I am very pleased with how things seem to be progressing. No fevers, chills, nausea, vomiting, or diarrhea. 03/20/2019 on evaluation today patient unfortunately  is continuing to have issues with significant edema of the left lower extremity. Her right side seems to be doing much better. Unfortunately her left side is showing increased weeping of the lower  portion of her leg. This is quite unfortunate obviously we were hoping to get her into the lymphedema clinic they really do not seem to when I see her how if she is draining. Despite the fact this is really not wound related but more lymphedema weeping related. Nonetheless I do not know that this can be helpful for her to even go for that appointment since again I am not sure there is much that they would actually do at this point. We may need to try a 4 layer compression wrap as best we can on her leg. She is on the Augmentin currently although I am still concerned about whether or not there could be potentially something going on infection wise I would obtain a culture though I understand is not the best being that is a surface culture I just 1 to make sure I do not seem to be missing anything. 03/27/2019 on evaluation today patient appears to be doing much better in regard to the left lower extremity compared to last week. Last week she had tremendous weeping which I think was subsequent to infection now she seems to be doing much better and very pleased. This is not completely healed but there is a lot of new skin growth and it has dried out quite a bit. Overall I think that we are doing well with how things are moving along at this time. No fevers, chills, nausea, vomiting, or diarrhea. 04/03/2019 evaluation today patient appears to be doing a little worse this week compared to last time I saw her. I think this may be due to the fact that she is having issues with not being able to sleep in her bed at least not until last night. She is therefore been in a lift chair and subsequently has also had issues with not been able to use her pumps since she could not get in bed. With that being said the patient  overall seems to be doing okay I do think I may want extend the antibiotic for a little bit longer at least until we can see if her edema and her weeping gets better and if it is then obviously I can always discontinue the antibiotics as of next week however I want her to continue to have it over the next week. 04/10/2019 on evaluation today patient unfortunately is still doing poorly with regard to her left lower extremity. Her right is all things considering doing fairly well. On the left however she continues to have spreading of the area of infection and weeping which appears to be even a larger surface area than noted last week. She did have a positive culture for Pseudomonas in particular which seems to have been of concern she still has green/yellow discharge consistent with Pseudomonas and subsequently a tremendous amount of it. This has me obviously still concerned about the infection not really clearing up despite the fact that on culture it appears the Cipro should have been a good option for treating this. I think she may at this point need IV antibiotics since things are not doing better I do not want to get worse and cause sepsis. She is in agreement with the plan and believes as well that she likely does need to go to the hospital for IV vancomycin. Or something of the like depending on what the recommendation is from the ER. 04/17/2019 on evaluation today patient appears to be doing excellent in regard to her lower extremity on the left. She was  in the hospital for several days from when I sent her last we saw her until just this past Tuesday. Fortunately her drainage is significantly improved and in fact is mostly clear. There is just a couple small areas that may still drain a little bit she states that the [facility] they prescribed for her at discharge she went picked up from pharmacy and got home but has not been able to find it since. She is looked everywhere. She is wondering if I  will replace that for her today I will be more than happy to do that. 05/01/2019 on evaluation today patient actually appears to be doing quite well with regard to her lower extremities. She occasionally is having areas that will leak and then heal up mainly when a piece of the fibrotic skin pops off but fortunately she is not having any signs of active infection at this time. Overall she also really does not have any obvious weeping at this time. I do believe however she really needs some compression wraps and I think this may be a good time to get her back to the lymphedema clinic. 05/11/2019 on evaluation today patient actually appears to be doing quite well with regard to her bilateral lower extremities. She occasionally will have a small area that we per another but in general seems to be completely healed which is great news. Overall very pleased with how everything seems to be progressing. She does have her appointment with lymphedema clinic on November 18. 05/25/2019 on evaluation today patient appears to be doing well with regard to her left lower extremity. I am very pleased in this regard. In regard to her right leg this actually did start draining more I think it is mainly due to the fact that her leg is more swollen. I am not seeing any obvious signs of infection at this time although that is definitely something were obviously acutely aware of simply due to the fact that she had an issue not too far back with exactly this issue. Nonetheless I do feel like that lymphedema clinic would still be beneficial for her. I explained obviously if they are not able to do anything treatment wise on the right leg we could at least have them treat her left leg and then proceed from there. The patient is really in agreement with that plan. If they are able to do both as the drainage slows down that I would be happy to let them handle both. 06/01/2019 on evaluation today patient unfortunately appears to  be doing worse with regard to her right lower extremity. The left lower extremity is still maintaining at this point. Unfortunately she has been having significantly increased pain over the past several days and has been experiencing as well increased swelling of the right lower extremity. I really do not know that I am seeing anything that appears to be obvious for infection at this point to be peripherally honest. With that being said the patient does seem to be having much more swelling that she is even experienced in the past and coupled with increased pain in her hip as well I am concerned that again she could potentially have a DVT although I am not 100% sure of this. I think it something that may need to be checked out. We discussed the possibility of sending her for a DVT study through the hospital but unfortunately transportation is an issue if she does have a DVT I do not want her to wait days  to be able to get in for that test however if she has this scheduled as an outpatient that is as fast that she will be able to get the test scheduled for transportation purposes. That will also fall on Thanksgiving so subsequently she did actually be looking at either Friday or even next week before we would know anything back from this. That is much too long in my opinion. Subsequent to the amount of discomfort she is experiencing the patient is actually okay with going to the ER for evaluation today. 06/12/2019 on evaluation today patient actually appears to be doing significantly better compared to last time I saw her. Following when I last saw her she was actually in the hospital from that Monday until the following Sunday almost 1 full week. She actually was placed on Keflex in the hospital following the time for her to be discharged and Dr. Steva Ready has recommended 2 times a day dosing of the Keflex for the next year in order to help with more prophylactic/preventative measures with regard to her  developing cellulitis. Overall I think this sounds like an excellent plan. The patient unfortunately is good to have trouble being treated at lymphedema clinic due to the fact that she really cannot get up on the bed that they have there. They also state that they cannot manage her as long as she has anything draining at this point. Obviously that is somewhat unfortunate as she does need help with edema control but nonetheless we will have to do what we can for her outside of it sounds like the lymphedema clinic scenario at this point. 06/19/2019 on evaluation today patient appears to be doing fairly well with regard to her bilateral lower extremities. She is not nearly as swollen [name], [name] ([id number]) and shows no signs of infection at this point. There is no evidence of cellulitis whatsoever. She also has no open wounds or draining at this point which is also good news. No fever chills noted. She seems to be in very good spirits and in fact appears to be doing quite well. READMISSION 11/27/2019 This is a 66 year old woman that we have had in this clinic several times before including 2015, 16 and 19 and then most recently from 03/20/2019 through 06/19/2019 with bilateral lower extremity lymphedema. She has had previous arterial and reflux studies done years ago which were not all that remarkable. 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 She does have a positive family history and she had large legs starting may be in her 72s. She was recently in hospital from 10/20/2019 through 10/28/2019 with right leg cellulitis. She was given Ancef and clindamycin and then Zosyn when a culture showed Pseudomonas. At that time there was purulent drainage. She was followed by infectious disease Dr Steva Ready. The patient is now back at home. She has noted increased swelling in the right and no drainage in her right leg mostly on the posterior  medial aspect in the calf area. She has not had pain or fever. She has literally been improved lysing above dressings because her at the area of this is far too large for standard compression. She has been wrapping the areas with sheets to resorptive pads. She is found these helped somewhat. She does have an appointment with the lymphedema clinic in [place] in late June. Past medical history includes bilateral lymphedema, hypertension, obstructive sleep apnea with CPAP. Recent hospitalization with apparently Pseudomonas cellulitis of the  right lower leg 12/15/2019 upon evaluation today patient appears to be doing a little bit worse in regard to her right lower extremity. Unfortunately she is having more weeping down in the lower portion of her leg. Fortunately there is no signs of active infection at this time. No fever chills noted. The patient states she is not having increased pain except for when she attempted to use the lymphedema pumps unfortunately she states that she did have pain when she did this. Otherwise we been using absorptive dressings of one type or another she is using diapers at home and then subsequently Ace wraps. In regard to the barrier cream we have discussed the possibility of derma cloud which she would like to try I do not have a problem with that. 12/22/2019 upon evaluation today patient actually appears to be doing better in regard to her leg ulcers at this point. Fortunately there does not appear to be any signs of active infection which is great news and I am extremely pleased with where things are progressing at this time. There is no sign of active infection currently. The patient is very pleased to see things doing so well. 12/29/2019 upon evaluation today patient appears to be doing a little bit better in regard to her weeping in general over her lower extremities. She does have some signs of mild erythema little bit more than what I noted last week or rather last  visit. Nonetheless I think that my threshold for switching her antibiotics from Keflex to something else is very low at this point considering that she has had such severe infections in the past that seem to come almost out of nowhere. There is a little erythema and warmth noted of the lower portion of her leg compared to the upper which also makes me want to go ahead and address things more rapidly at this point. Likely I would switch out the Keflex for something like Levaquin ideally. 7/16; patient with severe bilateral lymphedema. She has superficial wounds albeit almost circumferential now on the left lateral lower leg. This may be new from last time. Small area on the right anterior lower leg and then another area on the right medial lower leg and of pannus fold. She has been using various absorptive garments. She states she is using her compression pumps once a day occasionally twice. Culture from her last visit here was negative 01/29/2020 on evaluation today patient appears to be doing excellent at this point in regard to her legs with regard to infection I see no signs of active infection at this point. She still does have unfortunately areas of weeping this is minimal on the right now her left is actually significantly worse although I do not think it is as bad as last week with Dr. Dellia Nims saw her. She has been trying to pump and elevate her legs is much as possible. She has previously been on the Keflex and in the past for prevention that seems to do fairly well and likely can extend that today. 02/04/2020 on evaluation today patient appears to be doing better in regard to her legs bilaterally. Fortunately there is no signs of active infection at this time which is great news and overall she has less weeping on the left compared to the right and there is several spots where she is pretty much sealed up with no draining regions. Overall very pleased in this regard. 02/19/2020 on evaluation  today patient appears to be doing very well in regard  to her wounds currently. Fortunately there is no evidence of active infection overall very pleased with where things stand. She is significantly improved in regard to her edema I am extremely pleased in this regard she tells me that the popping no longer hurts and in fact she actually looks forward to it. 03/04/2020 on evaluation today patient appears to be doing excellent in regard to her lower extremities. Fortunately there is no signs of active infection at this time. No fevers, chills, nausea, vomiting, or diarrhea. 03/25/2020 on evaluation today patient appears to be doing a little bit more poorly in regard to her legs at this point. She tells me that she is still continue to have issues with drainage and this has been a little bit worse she was getting ready to start taking the Keflex again but wanted to see me first. Fortunately there is no signs of active infection at this time. No fevers, chills, nausea, vomiting, or diarrhea. 04/15/2020 upon evaluation today patient appears to be doing somewhat poorly in regard to her right leg. She tells me she has been having more pain she has been taking the Keflex that was previously prescribed unfortunately that just does not seem to help with this. She was hoping that the pain on her right was actually coming from the fact that she was having issues with her wrap having gotten caught in her recliner. With that being said she tells me that she knew something was not right. Currently her right leg is warm to touch along with being erythematous all the way up to around at least mid thigh as far as I can see. The left leg does not appear to be doing that badly though there is increased weeping around the ankle region. 05/06/2020 on evaluation today patient appears to be doing much better than last time I saw her. She did go to the hospital where she was admitted for 2 days and treated with antibiotic  therapy. She was discharged with antibiotics as well and has done extremely well. I am extremely pleased with where things stand today. There is no signs of active infection at this time which is great news. 05/27/2020 upon evaluation today patient appears to be doing well with regard to her lower extremities bilaterally. She has just a very tiny area on the right leg which is opening on the left leg she is significantly improved though she still has several areas that do appear to be open this is minimal compared to what is been in the past. In general I am extremely pleased with where things stand today. The patient does tell me she is not been using her pumps quite as much as she should be. I do believe that is 1 area she can definitely work on. She has had a lot going on including a Covid exposure and apparently also a outbreak of likely shingles. [name], [name] ([id number]) 06/24/2020 upon evaluation today patient appears to be doing well with regard to her legs in general although the left leg unfortunately is showing some signs of erythema she does have a little bit of increased weeping and to be honest I am concerned about infection here. I discussed that with her today and I think that we may need to address this sooner rather than later she has been taking Keflex she is not really certain that is been making a big improvement however. No fevers, chills, nausea, vomiting, or diarrhea. 06/30/2020 upon evaluation today patient's legs actually seem to  be doing better in my opinion as compared to where they were last week. Fortunately there does not appear to be any signs of active infection. Her culture showed multiple organisms nothing predominate. With that being said the Levaquin seems to have done well I think she has improved since I last saw her as well. 07/14/2020 upon evaluation today patient appears to be doing actually better in regard to her lower extremities in my opinion. She has  been tolerating the dressing changes without complication. Fortunately there is no signs of active infection at this time. 08/04/2020 upon evaluation today patient appears to be doing excellent in regard to her leg ulcers. Fortunately she has very little that is open at this point. This is great news. In fact I think that the Goldbond medicated powder has been excellent for her. It seems to have done the trick where we had tried several other things without as much success. Fortunately there is no evidence of active infection at this time. No fevers, chills, nausea, vomiting, or diarrhea. 09/01/2020 upon evaluation today patient appears to be doing a little bit more poorly than the last time I saw her. She tells me right now that she has been having a lot of drainage compared to where things were previous which has unfortunately led to more irritation as well. She is concerned that this is leading to infection. Again based on what I am seeing today as well I am also concerned of the same to be honest. 09/15/2020 upon evaluation today patient appears to be doing well with regard to her legs compared to what I saw previous. Fortunately there does not appear to be any evidence of active infection at this time which is great news. At least not as badly as what it was previous. With that being said I do believe that the patient does need to have an extension of the antibiotics. This I believe will actually help her more in the way of making sure this stays under control and does not worsen. That was the Bactrim DS. Readmission: 08/01/2021 upon evaluation today patient appears to be doing actually pretty well all things considered. Is actually been almost a year since have seen her last March. Fortunately I do not see any evidence of active infection locally nor systemically at this point. She does have a couple open areas on the left leg the right leg appears to be doing quite well. In general she has been in  the hospital 3 times since I saw her a year ago all 4 cellulitis type issues except for the last 1 which was actually more related to congestive heart failure for that reason she is not using her lymphedema pumps at this point and that is probably the best idea. 08/08/2021 upon evaluation today patient appears to be doing well with regard to her legs all things considered her left leg may be a little bit more swollen based on what I see today. Fortunately I do not see any signs of active infection locally nor systemically at this time which is great news. No fevers, chills, nausea, vomiting, or diarrhea. 08/15/2021 upon evaluation today patient actually appears to be doing excellent in regard to her wounds. She has been tolerating the dressing changes without complication. Fortunately I see no evidence of infection currently which is great news. No fevers, chills, nausea, vomiting, or diarrhea. 08/29/2021 upon evaluation patient appears to be doing excellent she in fact is almost completely healed. I am actually very pleased  with where we stand today and I think that she is making wonderful progress she just has a small area of weeping on the right lateral leg everything else is pretty much completely healed which is also. Objective Constitutional Obese and well-hydrated in no acute distress. Vitals Time Taken: 11:25 AM, Height: 63 in, Weight: 372 lbs, BMI: 65.9, Temperature: 98.1 F, Pulse: 71 bpm, Respiratory Rate: 20 breaths/min. Respiratory normal breathing without difficulty. Psychiatric this patient is able to make decisions and demonstrates good insight into disease process. Alert and Oriented x 3. pleasant and cooperative. General Notes: Upon inspection again patient has small area of weeping over the right lateral leg inferiorly. There does not appear to be any signs of infection and overall I think were very close to get her back to her normal. Integumentary (Hair, Skin) Wound #9 status  is Open. Original cause of wound was Blister. The date acquired was: 07/31/2021. The wound has been in treatment 3 weeks. The wound is located on the Right Lower Leg. The wound measures 1.2cm length x 1cm width x 0.1cm depth; 0.942cm^2 area and 0.094cm^3 volume. There is no tunneling or undermining noted. There is a medium amount of serosanguineous drainage noted. There is small (1-33%) pink granulation within the wound bed. There is no necrotic tissue within the wound bed. 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 Nurse Visit as needed Bathing/ Shower/ Hygiene: May shower; gently cleanse wound with antibacterial soap, rinse and pat dry prior to dressing wounds WOUND #9: - Lower Leg Wound Laterality: Right Primary Dressing: Silvercel 4 1/4x 4 1/4 (in/in) 1 x Per Day/30 Days Discharge Instructions: Apply Silvercel 4 1/4x 4 1/4 (in/in) as instructed Secondary Dressing: ABD Pad 5x9 (in/in) 1 x Per Day/30 Days Discharge Instructions: Cover with ABD pad Secured With: 60M ACE Elastic Bandage With VELCRO Brand Closure, 4 (in) 1 x Per Day/30 Days Secured With: 60M Medipore H Soft Cloth Surgical Tape, 2x2 (in/yd) 1 x Per Day/30 Days 1. Would recommend currently that we going continue with the wound care measures as before and the patient is in agreement with the plan. This includes the use of the silver alginate to the open area again this is just the right lateral leg at this point. 2. I am also can recommend that we continue with the ABD pad and Chux pad to secure in place. 3. I am also going to suggest that she continue to elevate her legs much as possible and again if she can use her lymphedema pumps that would be  awesome as well. We will see patient back for reevaluation in 1 week here in the clinic. If anything worsens or changes patient will contact our office for additional recommendations. Electronic Signature(s) Signed: 08/29/2021 4:36:31 PM By: Worthy Keeler PA-C Entered By: Worthy Keeler on 08/29/2021 16:36:30 [name], [name] ([id number]) -------------------------------------------------------------------------------- SuperBill Details Patient Name: [name] Date of Service: 08/29/2021 Medical Record Number: [id number] Patient Account Number: [id number] Date of Birth/Sex: 1955/11/09 (66 y.o. F) Treating RN: [name] Dredge Primary Care Provider: Ranae Plumber Other Clinician: Referring Provider: Ranae Plumber Treating Provider/Extender: Skipper Cliche in Treatment: 4 Diagnosis Coding ICD-10 Codes Code Description Q82.0 Hereditary lymphedema L97.811 Non-pressure chronic ulcer of other part of right lower  leg limited to breakdown of skin L97.821 Non-pressure chronic ulcer of other part of left lower leg limited to breakdown of skin Facility Procedures CPT4 Code: [id number] Description: [phone] - WOUND CARE VISIT-LEV 2 EST PT Modifier: Quantity: 1 Physician Procedures CPT4 Code: [id number] Description: [id number] - WC PHYS LEVEL 3 - EST PT Modifier: Quantity: 1 CPT4 Code: Description: ICD-10 Diagnosis Description Q82.0 Hereditary lymphedema L97.811 Non-pressure chronic ulcer of other part of right lower leg limited to bre L97.821 Non-pressure chronic ulcer of other part of left lower leg limited to brea Modifier: akdown of skin kdown of skin Quantity: Electronic Signature(s) Signed: 08/29/2021 4:37:10 PM By: Worthy Keeler PA-C Previous Signature: 08/29/2021 3:53:43 PM Version By: [name] Dredge Entered By: Worthy Keeler on 08/29/2021 16:37:10

## 2021-09-05 ENCOUNTER — Encounter: Payer: Medicare HMO | Admitting: Physician Assistant

## 2021-09-05 ENCOUNTER — Other Ambulatory Visit: Payer: Self-pay

## 2021-09-05 DIAGNOSIS — Q820 Hereditary lymphedema: Secondary | ICD-10-CM | POA: Diagnosis not present

## 2021-09-05 NOTE — Progress Notes (Addendum)
[name], [name] ([id number]) Visit Report for 09/05/2021 Chief Complaint Document Details Patient Name: [name], [name]. Date of Service: 09/05/2021 11:15 AM Medical Record Number: [id number] Patient Account Number: [id number] Date of Birth/Sex: 07-May-1956 (66 y.o. F) Treating RN: [name] Dredge Primary Care Provider: Ranae Plumber Other Clinician: Referring Provider: Ranae Plumber Treating Provider/Extender: Skipper Cliche in Treatment: 5 Information Obtained from: Patient Chief Complaint Bilateral LE lymphedema with Left LE Ulcers Electronic Signature(s) Signed: 09/05/2021 11:36:15 AM By: Worthy Keeler PA-C Entered By: Worthy Keeler on 09/05/2021 11:36:15 Swint, [name] ([id number]) -------------------------------------------------------------------------------- HPI Details Patient Name: [name] Date of Service: 09/05/2021 11:15 AM Medical Record Number: [id number] Patient Account Number: [id number] Date of Birth/Sex: 1956-05-18 (66 y.o. F) Treating RN: [name] Dredge Primary Care Provider: Ranae Plumber Other Clinician: Referring Provider: Ranae Plumber Treating Provider/Extender: Skipper Cliche in Treatment: 5 History of Present Illness HPI Description: The patient is a 66 year old female with history of hypertension and a long-standing history of bilateral lower extremity lymphedema (first presented on 4/2) . She has had open ulcers in the past which have always responded to compression therapy. She had briefly been to a lymphedema clinic in the past which helped her at the time. this time around she stopped treatment of her lymphedema pumps approximately 2 weeks ago because of some pain in the knees and then noticed the right leg getting worse. She was seen by her PCP who put her on clindamycin 4 times a day 2 days ago. The patient has seen [facility] and Dr. Delana Meyer had seen her last year where a vascular study including venous and arterial duplex  studies were within normal limits. he had recommended compression stockings and lymphedema pumps and the patient has been using this in about 2 weeks ago. She is known to be diabetic but in the past few time she's gone to her primary care doctor her hemoglobin A1c has been normal. 02/11/2015 - after her last visit she took my advice and went to the ER regarding the progressive cellulitis of her right lower extremity and she was admitted between July 17 and 22nd. She received IV antibiotics and then was sent home on a course of steroid-induced and oral antibiotics. She has improved much since then. 02/17/2015 -- she has been doing fine and the weeping of her legs has remarkably gone down. She has no fresh issues. READMISSION 01/15/18 This patient was given this clinic before most recently in 2016 seen by Dr. Con Memos. She has massive bilateral lymphedema and over the last 2 months this had weeping edema out of the left leg. She has compression pumps but her compliance with these has been minimal. She has [facility] they've been using TCA/ABDs/kerlix under an Ace wrap.she has had recent problems with cellulitis. She was apparently seen in the ER and 12/23/17 and given clindamycin. She was then followed by her primary doctor and given doxycycline and Keflex. The pain seems to have settled down. In April 2018 the patient had arterial studies done at [facility]. This showed triphasic waveforms throughout the right leg and mostly triphasic waveforms on the left except for monophasic at the posterior tibial artery distally. She was not felt to have evidence of right lower extremity arterial stenosis or significant problems on the left side. 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 She also had a right lower extremity venous Doppler in January 2018 this was limited by the patient's body  habitus and lymphedema. Most of the proximal veins were not  visualized The patient presents with an area of denuded skin on the anterior medial part of the left calf. There is weeping edema fluid here. 01/22/18; the patient has somewhat better edema control using her compression pumps twice a day and as a result she has much better epithelialization on the left anterior calf area. Only a small open area remains. 01/29/18; the patient has been compliant with her compression pumps. Both the areas on her calf that healed. The remaining area on the left anterior leg is fully epithelialized Readmission: 02/20/2019 upon evaluation today patient presents for reevaluation due to issues that she is having with the bilateral lower extremities. She actually has wounds open on both legs. On the right she has an area in the crease of her leg on the right around the knee region which is actually draining quite a bit and actually has some fungal type appearance to it. She has been on nystatin powder that seems to have helped to some degree. In regard to the left lower extremity this is actually in the lower portion of her leg closer to the ankle and again is continuing to drain as well unfortunately. There does not appear to be any signs of active infection at this time which is good news. No fevers, chills, nausea, vomiting, or diarrhea. She tells me that since she was seen last year she is actually been doing quite well for the most part with regard to her lower extremities. Unfortunately she now is experiencing a little bit more drainage at this time. She is concerned about getting this under control so that it does not get significantly worse. 02/27/2019 on evaluation today patient appears to be doing somewhat better in regard to her bilateral lower extremity wounds. She has been tolerating the dressing changes without complication. Fortunately there is no signs of active infection at this point. No fevers, chills, nausea, vomiting, or diarrhea. She did get her dressing  supplies which is excellent news she was extremely excited to get these. She also got paperwork from [employer] for their financial assistance program where they may be able to help her out in the future if needed with supplies at discounted prices. 03/06/2019 on evaluation today patient appears to be doing a little worse with regard to both areas of weeping on her bilateral lower extremities. This is around the right medial knee and just above the left ankle. With that being said she is unfortunately not doing as well as I would like to see. I feel like she may need to potentially go see someone at the lymphedema clinic as the wraps that she needs or even beyond what we can do here at the wound care center. She really does not have wounds she just has open areas of weeping that are causing some difficulty for her. Subsequently because of this and the moisture I am concerned about the potential for infection I am going to likely give her a prophylactic antibiotic today, Keflex, just to be on the safe side. Nonetheless again there is no obvious signs of active infection at this time. 03/13/2019 on evaluation today patient appears to be doing well with regard to her bilateral lower extremities where she has been weeping compared to even last week's evaluation. I see some areas of new skin growth which is excellent and overall I am very pleased with how things seem to be progressing. No fevers, chills, nausea, vomiting, or diarrhea. [name],  [name] ([id number]) 03/20/2019 on evaluation today patient unfortunately is continuing to have issues with significant edema of the left lower extremity. Her right side seems to be doing much better. Unfortunately her left side is showing increased weeping of the lower portion of her leg. This is quite unfortunate obviously we were hoping to get her into the lymphedema clinic they really do not seem to when I see her how if she is draining. Despite the fact this is really  not wound related but more lymphedema weeping related. Nonetheless I do not know that this can be helpful for her to even go for that appointment since again I am not sure there is much that they would actually do at this point. We may need to try a 4 layer compression wrap as best we can on her leg. She is on the Augmentin currently although I am still concerned about whether or not there could be potentially something going on infection wise I would obtain a culture though I understand is not the best being that is a surface culture I just 1 to make sure I do not seem to be missing anything. 03/27/2019 on evaluation today patient appears to be doing much better in regard to the left lower extremity compared to last week. Last week she had tremendous weeping which I think was subsequent to infection now she seems to be doing much better and very pleased. This is not completely healed but there is a lot of new skin growth and it has dried out quite a bit. Overall I think that we are doing well with how things are moving along at this time. No fevers, chills, nausea, vomiting, or diarrhea. 04/03/2019 evaluation today patient appears to be doing a little worse this week compared to last time I saw her. I think this may be due to the fact that she is having issues with not being able to sleep in her bed at least not until last night. She is therefore been in a lift chair and subsequently has also had issues with not been able to use her pumps since she could not get in bed. With that being said the patient overall seems to be doing okay I do think I may want extend the antibiotic for a little bit longer at least until we can see if her edema and her weeping gets better and if it is then obviously I can always discontinue the antibiotics as of next week however I want her to continue to have it over the next week. 04/10/2019 on evaluation today patient unfortunately is still doing poorly with regard to her  left lower extremity. Her right is all things considering doing fairly well. On the left however she continues to have spreading of the area of infection and weeping which appears to be even a larger surface area than noted last week. She did have a positive culture for Pseudomonas in particular which seems to have been of concern she still has green/yellow discharge consistent with Pseudomonas and subsequently a tremendous amount of it. This has me obviously still concerned about the infection not really clearing up despite the fact that on culture it appears the Cipro should have been a good option for treating this. I think she may at this point need IV antibiotics since things are not doing better I do not want to get worse and cause sepsis. She is in agreement with the plan and believes as well that she likely does need  to go to the hospital for IV vancomycin. Or something of the like depending on what the recommendation is from the ER. 04/17/2019 on evaluation today patient appears to be doing excellent in regard to her lower extremity on the left. She was in the hospital for several days from when I sent her last we saw her until just this past Tuesday. Fortunately her drainage is significantly improved and in fact is mostly clear. There is just a couple small areas that may still drain a little bit she states that the [facility] they prescribed for her at discharge she went picked up from pharmacy and got home but has not been able to find it since. She is looked everywhere. She is wondering if I will replace that for her today I will be more than happy to do that. 05/01/2019 on evaluation today patient actually appears to be doing quite well with regard to her lower extremities. She occasionally is having areas that will leak and then heal up mainly when a piece of the fibrotic skin pops off but fortunately she is not having any signs of active infection at this time. Overall she also really does  not have any obvious weeping at this time. I do believe however she really needs some compression wraps and I think this may be a good time to get her back to the lymphedema clinic. 05/11/2019 on evaluation today patient actually appears to be doing quite well with regard to her bilateral lower extremities. She occasionally will have a small area that we per another but in general seems to be completely healed which is great news. Overall very pleased with how everything seems to be progressing. She does have her appointment with lymphedema clinic on November 18. 05/25/2019 on evaluation today patient appears to be doing well with regard to her left lower extremity. I am very pleased in this regard. In regard to her right leg this actually did start draining more I think it is mainly due to the fact that her leg is more swollen. I am not seeing any obvious signs of infection at this time although that is definitely something were obviously acutely aware of simply due to the fact that she had an issue not too far back with exactly this issue. Nonetheless I do feel like that lymphedema clinic would still be beneficial for her. I explained obviously if they are not able to do anything treatment wise on the right leg we could at least have them treat her left leg and then proceed from there. The patient is really in agreement with that plan. If they are able to do both as the drainage slows down that I would be happy to let them handle both. 06/01/2019 on evaluation today patient unfortunately appears to be doing worse with regard to her right lower extremity. The left lower extremity is still maintaining at this point. Unfortunately she has been having significantly increased pain over the past several days and has been experiencing as well increased swelling of the right lower extremity. I really do not know that I am seeing anything that appears to be obvious for infection at this point to be  peripherally honest. With that being said the patient does seem to be having much more swelling that she is even experienced in the past and coupled with increased pain in her hip as well I am concerned that again she could potentially have a DVT although I am not 100% sure of this. I think  it something that may need to be checked out. We discussed the possibility of sending her for a DVT study through the hospital but unfortunately transportation is an issue if she does have a DVT I do not want her to wait days to be able to get in for that test however if she has this scheduled as an outpatient that is as fast that she will be able to get the test scheduled for transportation purposes. That will also fall on Thanksgiving so subsequently she did actually be looking at either Friday or even next week before we would know anything back from this. That is much too long in my opinion. Subsequent to the amount of discomfort she is experiencing the patient is actually okay with going to the ER for evaluation today. 06/12/2019 on evaluation today patient actually appears to be doing significantly better compared to last time I saw her. Following when I last saw her she was actually in the hospital from that Monday until the following Sunday almost 1 full week. She actually was placed on Keflex in the hospital following the time for her to be discharged and Dr. Steva Ready has recommended 2 times a day dosing of the Keflex for the next year in order to help with more prophylactic/preventative measures with regard to her developing cellulitis. Overall I think this sounds like an excellent plan. The patient unfortunately is good to have trouble being treated at lymphedema clinic due to the fact that she really cannot get up on the bed that they have there. They also state that they cannot manage her as long as she has anything draining at this point. Obviously that is somewhat unfortunate as she does need help  with edema control but nonetheless we will have to do what we can for her outside of it sounds like the lymphedema clinic scenario at this point. 06/19/2019 on evaluation today patient appears to be doing fairly well with regard to her bilateral lower extremities. She is not nearly as swollen and shows no signs of infection at this point. There is no evidence of cellulitis whatsoever. She also has no open wounds or draining at this point which is also good news. No fever chills noted. She seems to be in very good spirits and in fact appears to be doing quite well. READMISSION 11/27/2019 [name], [name] ([id number]) This is a 66 year old woman that we have had in this clinic several times before including 2015, 16 and 19 and then most recently from 03/20/2019 through 06/19/2019 with bilateral lower extremity lymphedema. She has had previous arterial and reflux studies done years ago which were not all that remarkable. 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 She does have a positive family history and she had large legs starting may be in her 90s. She was recently in hospital from 10/20/2019 through 10/28/2019 with right leg cellulitis. She was given Ancef and clindamycin and then Zosyn when a culture showed Pseudomonas. At that time there was purulent drainage. She was followed by infectious disease Dr Steva Ready. The patient is now back at home. She has noted increased swelling in the right and no drainage in her right leg mostly on the posterior medial aspect in the calf area. She has not had pain or fever. She has literally been improved lysing above dressings because her at the area of this is far too large for standard compression. She has been wrapping the areas with sheets to resorptive  pads. She is found these helped somewhat. She does have an appointment with the lymphedema clinic in [place] in late June. Past medical history  includes bilateral lymphedema, hypertension, obstructive sleep apnea with CPAP. Recent hospitalization with apparently Pseudomonas cellulitis of the right lower leg 12/15/2019 upon evaluation today patient appears to be doing a little bit worse in regard to her right lower extremity. Unfortunately she is having more weeping down in the lower portion of her leg. Fortunately there is no signs of active infection at this time. No fever chills noted. The patient states she is not having increased pain except for when she attempted to use the lymphedema pumps unfortunately she states that she did have pain when she did this. Otherwise we been using absorptive dressings of one type or another she is using diapers at home and then subsequently Ace wraps. In regard to the barrier cream we have discussed the possibility of derma cloud which she would like to try I do not have a problem with that. 12/22/2019 upon evaluation today patient actually appears to be doing better in regard to her leg ulcers at this point. Fortunately there does not appear to be any signs of active infection which is great news and I am extremely pleased with where things are progressing at this time. There is no sign of active infection currently. The patient is very pleased to see things doing so well. 12/29/2019 upon evaluation today patient appears to be doing a little bit better in regard to her weeping in general over her lower extremities. She does have some signs of mild erythema little bit more than what I noted last week or rather last visit. Nonetheless I think that my threshold for switching her antibiotics from Keflex to something else is very low at this point considering that she has had such severe infections in the past that seem to come almost out of nowhere. There is a little erythema and warmth noted of the lower portion of her leg compared to the upper which also makes me want to go ahead and address things more  rapidly at this point. Likely I would switch out the Keflex for something like Levaquin ideally. 7/16; patient with severe bilateral lymphedema. She has superficial wounds albeit almost circumferential now on the left lateral lower leg. This may be new from last time. Small area on the right anterior lower leg and then another area on the right medial lower leg and of pannus fold. She has been using various absorptive garments. She states she is using her compression pumps once a day occasionally twice. Culture from her last visit here was negative 01/29/2020 on evaluation today patient appears to be doing excellent at this point in regard to her legs with regard to infection I see no signs of active infection at this point. She still does have unfortunately areas of weeping this is minimal on the right now her left is actually significantly worse although I do not think it is as bad as last week with Dr. Dellia Nims saw her. She has been trying to pump and elevate her legs is much as possible. She has previously been on the Keflex and in the past for prevention that seems to do fairly well and likely can extend that today. 02/04/2020 on evaluation today patient appears to be doing better in regard to her legs bilaterally. Fortunately there is no signs of active infection at this time which is great news and overall she has less weeping  on the left compared to the right and there is several spots where she is pretty much sealed up with no draining regions. Overall very pleased in this regard. 02/19/2020 on evaluation today patient appears to be doing very well in regard to her wounds currently. Fortunately there is no evidence of active infection overall very pleased with where things stand. She is significantly improved in regard to her edema I am extremely pleased in this regard she tells me that the popping no longer hurts and in fact she actually looks forward to it. 03/04/2020 on evaluation today patient  appears to be doing excellent in regard to her lower extremities. Fortunately there is no signs of active infection at this time. No fevers, chills, nausea, vomiting, or diarrhea. 03/25/2020 on evaluation today patient appears to be doing a little bit more poorly in regard to her legs at this point. She tells me that she is still continue to have issues with drainage and this has been a little bit worse she was getting ready to start taking the Keflex again but wanted to see me first. Fortunately there is no signs of active infection at this time. No fevers, chills, nausea, vomiting, or diarrhea. 04/15/2020 upon evaluation today patient appears to be doing somewhat poorly in regard to her right leg. She tells me she has been having more pain she has been taking the Keflex that was previously prescribed unfortunately that just does not seem to help with this. She was hoping that the pain on her right was actually coming from the fact that she was having issues with her wrap having gotten caught in her recliner. With that being said she tells me that she knew something was not right. Currently her right leg is warm to touch along with being erythematous all the way up to around at least mid thigh as far as I can see. The left leg does not appear to be doing that badly though there is increased weeping around the ankle region. 05/06/2020 on evaluation today patient appears to be doing much better than last time I saw her. She did go to the hospital where she was admitted for 2 days and treated with antibiotic therapy. She was discharged with antibiotics as well and has done extremely well. I am extremely pleased with where things stand today. There is no signs of active infection at this time which is great news. 05/27/2020 upon evaluation today patient appears to be doing well with regard to her lower extremities bilaterally. She has just a very tiny area on the right leg which is opening on the left leg  she is significantly improved though she still has several areas that do appear to be open this is minimal compared to what is been in the past. In general I am extremely pleased with where things stand today. The patient does tell me she is not been using her pumps quite as much as she should be. I do believe that is 1 area she can definitely work on. She has had a lot going on including a Covid exposure and apparently also a outbreak of likely shingles. 06/24/2020 upon evaluation today patient appears to be doing well with regard to her legs in general although the left leg unfortunately is showing some signs of erythema she does have a little bit of increased weeping and to be honest I am concerned about infection here. I discussed that with her today and I think that we may need to address  this sooner rather than later she has been taking Keflex she is not really certain that is been making a big improvement however. No fevers, chills, nausea, vomiting, or diarrhea. [name], [name] ([id number]) 06/30/2020 upon evaluation today patient's legs actually seem to be doing better in my opinion as compared to where they were last week. Fortunately there does not appear to be any signs of active infection. Her culture showed multiple organisms nothing predominate. With that being said the Levaquin seems to have done well I think she has improved since I last saw her as well. 07/14/2020 upon evaluation today patient appears to be doing actually better in regard to her lower extremities in my opinion. She has been tolerating the dressing changes without complication. Fortunately there is no signs of active infection at this time. 08/04/2020 upon evaluation today patient appears to be doing excellent in regard to her leg ulcers. Fortunately she has very little that is open at this point. This is great news. In fact I think that the Goldbond medicated powder has been excellent for her. It seems to have done the  trick where we had tried several other things without as much success. Fortunately there is no evidence of active infection at this time. No fevers, chills, nausea, vomiting, or diarrhea. 09/01/2020 upon evaluation today patient appears to be doing a little bit more poorly than the last time I saw her. She tells me right now that she has been having a lot of drainage compared to where things were previous which has unfortunately led to more irritation as well. She is concerned that this is leading to infection. Again based on what I am seeing today as well I am also concerned of the same to be honest. 09/15/2020 upon evaluation today patient appears to be doing well with regard to her legs compared to what I saw previous. Fortunately there does not appear to be any evidence of active infection at this time which is great news. At least not as badly as what it was previous. With that being said I do believe that the patient does need to have an extension of the antibiotics. This I believe will actually help her more in the way of making sure this stays under control and does not worsen. That was the Bactrim DS. Readmission: 08/01/2021 upon evaluation today patient appears to be doing actually pretty well all things considered. Is actually been almost a year since have seen her last March. Fortunately I do not see any evidence of active infection locally nor systemically at this point. She does have a couple open areas on the left leg the right leg appears to be doing quite well. In general she has been in the hospital 3 times since I saw her a year ago all 4 cellulitis type issues except for the last 1 which was actually more related to congestive heart failure for that reason she is not using her lymphedema pumps at this point and that is probably the best idea. 08/08/2021 upon evaluation today patient appears to be doing well with regard to her legs all things considered her left leg may be a little bit  more swollen based on what I see today. Fortunately I do not see any signs of active infection locally nor systemically at this time which is great news. No fevers, chills, nausea, vomiting, or diarrhea. 08/15/2021 upon evaluation today patient actually appears to be doing excellent in regard to her wounds. She has been tolerating the dressing  changes without complication. Fortunately I see no evidence of infection currently which is great news. No fevers, chills, nausea, vomiting, or diarrhea. 08/29/2021 upon evaluation patient appears to be doing excellent she in fact is almost completely healed. I am actually very pleased with where we stand today and I think that she is making wonderful progress she just has a small area of weeping on the right lateral leg everything else is pretty much completely healed which is also. 09/05/2021 upon evaluation today patient appears to be doing well with regard to her legs. She does have a small area of weeping on the right leg and there is a small area on the left leg as well. It is potentially something that may need to be addressed in the future more specifically but right now I think that there may have just been a little drainage here on the left. With all that being said I think that this still does not appear to be infected and overall I feel like she is doing quite well. That something we always have to keep a very close eye on as she can change very rapidly from okay to not okay. Electronic Signature(s) Signed: 09/05/2021 1:31:28 PM By: Worthy Keeler PA-C Entered By: Worthy Keeler on 09/05/2021 13:31:28 [name], [name] ([id number]) -------------------------------------------------------------------------------- Physical Exam Details Patient Name: ANEESA, ROMEY C. Date of Service: 09/05/2021 11:15 AM Medical Record Number: [id number] Patient Account Number: [id number] Date of Birth/Sex: 02-01-1956 (66 y.o. F) Treating RN: [name] Dredge Primary  Care Provider: Ranae Plumber Other Clinician: Referring Provider: Ranae Plumber Treating Provider/Extender: Skipper Cliche in Treatment: 5 Constitutional Obese and well-hydrated in no acute distress. Respiratory normal breathing without difficulty. Psychiatric this patient is able to make decisions and demonstrates good insight into disease process. Alert and Oriented x 3. pleasant and cooperative. Notes Upon inspection patient's wound bed actually showed signs of good granulation and epithelization at this point. Fortunately I do not see any evidence of active infection locally nor systemically which is great news and overall I am extremely pleased with where we stand today. Electronic Signature(s) Signed: 09/05/2021 1:32:30 PM By: Worthy Keeler PA-C Entered By: Worthy Keeler on 09/05/2021 13:32:30 [name], [name] ([id number]) -------------------------------------------------------------------------------- Physician Orders Details Patient Name: [name] Date of Service: 09/05/2021 11:15 AM Medical Record Number: [id number] Patient Account Number: [id number] Date of Birth/Sex: [birth year] (66 y.o. 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 o Nurse Visit as needed Bathing/ Shower/ Hygiene o May shower; gently cleanse wound with antibacterial soap, rinse and pat dry prior to dressing wounds Wound Treatment Wound #9 - Lower Leg Wound Laterality: Right Primary Dressing:  Silvercel 4 1/4x 4 1/4 (in/in) 1 x Per Day/30 Days Discharge Instructions: Apply Silvercel 4 1/4x 4 1/4 (in/in) as instructed Secondary Dressing: ABD Pad 5x9 (in/in) 1 x Per Day/30 Days Discharge Instructions: Cover with ABD pad Secured With: 94M ACE Elastic Bandage With VELCRO Brand Closure, 4 (in) 1 x Per Day/30 Days Secured With: 94M Medipore H Soft Cloth Surgical Tape, 2x2 (in/yd) 1 x  Per Day/30 Days Electronic Signature(s) Signed: 09/05/2021 4:37:03 PM By: [name] Dredge Signed: 09/05/2021 4:37:47 PM By: Worthy Keeler PA-C Entered By: [name] Dredge on 09/05/2021 12:09:28 [name] ([id number]) -------------------------------------------------------------------------------- Problem List Details Patient Name: NANDITA, MATHENIA. Date of Service: 09/05/2021 11:15 AM Medical Record Number: [id number] Patient Account Number: [id number] Date of Birth/Sex: [birth year] (66 y.o. 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.0 Hereditary lymphedema 08/01/2021 No Yes L97.811 Non-pressure chronic ulcer of other part of right lower leg limited to 08/01/2021 No Yes breakdown of skin L97.821 Non-pressure chronic ulcer of other part of left lower leg limited to 08/01/2021 No Yes breakdown of skin Inactive Problems Resolved Problems Electronic Signature(s) Signed: 09/05/2021 11:36:09 AM By: Worthy Keeler PA-C Entered By: Worthy Keeler on 09/05/2021 11:36:09 [name], [name] ([id number]) -------------------------------------------------------------------------------- Progress Note Details Patient Name: [name]. Date of Service: 09/05/2021 11:15 AM Medical Record Number: [id number] Patient Account Number: [id number] Date of Birth/Sex: 03-18-1956 (66 y.o.  F) Treating RN: [name] Dredge Primary Care Provider: Ranae Plumber Other Clinician: Referring Provider: Ranae Plumber Treating Provider/Extender: Skipper Cliche in Treatment: 5 Subjective Chief Complaint Information obtained from Patient Bilateral LE lymphedema with Left LE Ulcers History of Present Illness (HPI) The patient is a 66 year old female with history of hypertension and a long-standing history of bilateral lower extremity lymphedema (first presented on 4/2) . She has had open ulcers in the past which have always responded to compression therapy. She had briefly been to a lymphedema clinic in the past which helped her at the time. this time around she stopped treatment of her lymphedema pumps approximately 2 weeks ago because of some pain in the knees and then noticed the right leg getting worse. She was seen by her PCP who put her on clindamycin 4 times a day 2 days ago. The patient has seen [facility] and Dr. Delana Meyer had seen her last year where a vascular study including venous and arterial duplex studies were within normal limits. he had recommended compression stockings and lymphedema pumps and the patient has been using this in about 2 weeks ago. She is known to be diabetic but in the past few time she's gone to her primary care doctor her hemoglobin A1c has been normal. 02/11/2015 - after her last visit she took my advice and went to the ER regarding the progressive cellulitis of her right lower extremity and she was admitted between July 17 and 22nd. She received IV antibiotics and then was sent home on a course of steroid-induced and oral antibiotics. She has improved much since then. 02/17/2015 -- she has been doing fine and the weeping of her legs has remarkably gone down. She has no fresh issues. READMISSION 01/15/18 This patient was given this clinic before most recently in 2016 seen by Dr. Con Memos. She has massive bilateral lymphedema and over the last 2 months  this had weeping edema out of the left leg. She has compression pumps but her compliance with these has been minimal. She has [facility] they've been using TCA/ABDs/kerlix under an Ace wrap.she has had recent problems with cellulitis. She was apparently seen in the ER and 12/23/17 and given clindamycin. She was then followed by her primary doctor and given doxycycline and Keflex. The pain seems to have settled down. In April 2018 the  patient had arterial studies done at  [facility]. This showed triphasic waveforms throughout the right leg and mostly triphasic waveforms on the left except for monophasic at the posterior tibial artery distally. She was not felt to have evidence of right lower extremity arterial stenosis or significant problems on the left side. 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 She also had a right lower extremity venous Doppler in January 2018 this was limited by the patient's body habitus and lymphedema. Most of the proximal veins were not visualized The patient presents with an area of denuded skin on the anterior medial part of the left calf. There is weeping edema fluid here. 01/22/18; the patient has somewhat better edema control using her compression pumps twice a day and as a result she has much better epithelialization on the left anterior calf area. Only a small open area remains. 01/29/18; the patient has been compliant with her compression pumps. Both the areas on her calf that healed. The remaining area on the left anterior leg is fully epithelialized Readmission: 02/20/2019 upon evaluation today patient presents for reevaluation due to issues that she is having with the bilateral lower extremities. She actually has wounds open on both legs. On the right she has an area in the crease of her leg on the right around the knee region which is actually draining quite a bit and actually has some fungal type appearance to it.  She has been on nystatin powder that seems to have helped to some degree. In regard to the left lower extremity this is actually in the lower portion of her leg closer to the ankle and again is continuing to drain as well unfortunately. There does not appear to be any signs of active infection at this time which is good news. No fevers, chills, nausea, vomiting, or diarrhea. She tells me that since she was seen last year she is actually been doing quite well for the most part with regard to her lower extremities. Unfortunately she now is experiencing a little bit more drainage at this time. She is concerned about getting this under control so that it does not get significantly worse. 02/27/2019 on evaluation today patient appears to be doing somewhat better in regard to her bilateral lower extremity wounds. She has been tolerating the dressing changes without complication. Fortunately there is no signs of active infection at this point. No fevers, chills, nausea, vomiting, or diarrhea. She did get her dressing supplies which is excellent news she was extremely excited to get these. She also got paperwork from [employer] for their financial assistance program where they may be able to help her out in the future if needed with supplies at discounted prices. 03/06/2019 on evaluation today patient appears to be doing a little worse with regard to both areas of weeping on her bilateral lower extremities. This is around the right medial knee and just above the left ankle. With that being said she is unfortunately not doing as well as I would like to see. I feel like she may need to potentially go see someone at the lymphedema clinic as the wraps that she needs or even beyond what we can do here at the wound care center. She really does not have wounds she just has open areas of weeping that are causing some difficulty for her. Subsequently because of this and the moisture I am concerned about the potential for  infection I am going to likely give her a  prophylactic antibiotic today, Keflex, just to be on the safe side. Nonetheless again there is no obvious signs of active infection at this time. KEVIONNA, HEFFLER ([id number]) 03/13/2019 on evaluation today patient appears to be doing well with regard to her bilateral lower extremities where she has been weeping compared to even last week's evaluation. I see some areas of new skin growth which is excellent and overall I am very pleased with how things seem to be progressing. No fevers, chills, nausea, vomiting, or diarrhea. 03/20/2019 on evaluation today patient unfortunately is continuing to have issues with significant edema of the left lower extremity. Her right side seems to be doing much better. Unfortunately her left side is showing increased weeping of the lower portion of her leg. This is quite unfortunate obviously we were hoping to get her into the lymphedema clinic they really do not seem to when I see her how if she is draining. Despite the fact this is really not wound related but more lymphedema weeping related. Nonetheless I do not know that this can be helpful for her to even go for that appointment since again I am not sure there is much that they would actually do at this point. We may need to try a 4 layer compression wrap as best we can on her leg. She is on the Augmentin currently although I am still concerned about whether or not there could be potentially something going on infection wise I would obtain a culture though I understand is not the best being that is a surface culture I just 1 to make sure I do not seem to be missing anything. 03/27/2019 on evaluation today patient appears to be doing much better in regard to the left lower extremity compared to last week. Last week she had tremendous weeping which I think was subsequent to infection now she seems to be doing much better and very pleased. This is not completely healed but there  is a lot of new skin growth and it has dried out quite a bit. Overall I think that we are doing well with how things are moving along at this time. No fevers, chills, nausea, vomiting, or diarrhea. 04/03/2019 evaluation today patient appears to be doing a little worse this week compared to last time I saw her. I think this may be due to the fact that she is having issues with not being able to sleep in her bed at least not until last night. She is therefore been in a lift chair and subsequently has also had issues with not been able to use her pumps since she could not get in bed. With that being said the patient overall seems to be doing okay I do think I may want extend the antibiotic for a little bit longer at least until we can see if her edema and her weeping gets better and if it is then obviously I can always discontinue the antibiotics as of next week however I want her to continue to have it over the next week. 04/10/2019 on evaluation today patient unfortunately is still doing poorly with regard to her left lower extremity. Her right is all things considering doing fairly well. On the left however she continues to have spreading of the area of infection and weeping which appears to be even a larger surface area than noted last week. She did have a positive culture for Pseudomonas in particular which seems to have been of concern she still has green/yellow discharge consistent  with Pseudomonas and subsequently a tremendous amount of it. This has me obviously still concerned about the infection not really clearing up despite the fact that on culture it appears the Cipro should have been a good option for treating this. I think she may at this point need IV antibiotics since things are not doing better I do not want to get worse and cause sepsis. She is in agreement with the plan and believes as well that she likely does need to go to the hospital for IV vancomycin. Or something of the like  depending on what the recommendation is from the ER. 04/17/2019 on evaluation today patient appears to be doing excellent in regard to her lower extremity on the left. She was in the hospital for several days from when I sent her last we saw her until just this past Tuesday. Fortunately her drainage is significantly improved and in fact is mostly clear. There is just a couple small areas that may still drain a little bit she states that the [facility] they prescribed for her at discharge she went picked up from pharmacy and got home but has not been able to find it since. She is looked everywhere. She is wondering if I will replace that for her today I will be more than happy to do that. 05/01/2019 on evaluation today patient actually appears to be doing quite well with regard to her lower extremities. She occasionally is having areas that will leak and then heal up mainly when a piece of the fibrotic skin pops off but fortunately she is not having any signs of active infection at this time. Overall she also really does not have any obvious weeping at this time. I do believe however she really needs some compression wraps and I think this may be a good time to get her back to the lymphedema clinic. 05/11/2019 on evaluation today patient actually appears to be doing quite well with regard to her bilateral lower extremities. She occasionally will have a small area that we per another but in general seems to be completely healed which is great news. Overall very pleased with how everything seems to be progressing. She does have her appointment with lymphedema clinic on November 18. 05/25/2019 on evaluation today patient appears to be doing well with regard to her left lower extremity. I am very pleased in this regard. In regard to her right leg this actually did start draining more I think it is mainly due to the fact that her leg is more swollen. I am not seeing any obvious signs of infection at this time  although that is definitely something were obviously acutely aware of simply due to the fact that she had an issue not too far back with exactly this issue. Nonetheless I do feel like that lymphedema clinic would still be beneficial for her. I explained obviously if they are not able to do anything treatment wise on the right leg we could at least have them treat her left leg and then proceed from there. The patient is really in agreement with that plan. If they are able to do both as the drainage slows down that I would be happy to let them handle both. 06/01/2019 on evaluation today patient unfortunately appears to be doing worse with regard to her right lower extremity. The left lower extremity is still maintaining at this point. Unfortunately she has been having significantly increased pain over the past several days and has been experiencing as well increased  swelling of the right lower extremity. I really do not know that I am seeing anything that appears to be obvious for infection at this point to be peripherally honest. With that being said the patient does seem to be having much more swelling that she is even experienced in the past and coupled with increased pain in her hip as well I am concerned that again she could potentially have a DVT although I am not 100% sure of this. I think it something that may need to be checked out. We discussed the possibility of sending her for a DVT study through the hospital but unfortunately transportation is an issue if she does have a DVT I do not want her to wait days to be able to get in for that test however if she has this scheduled as an outpatient that is as fast that she will be able to get the test scheduled for transportation purposes. That will also fall on Thanksgiving so subsequently she did actually be looking at either Friday or even next week before we would know anything back from this. That is much too long in my opinion. Subsequent to  the amount of discomfort she is experiencing the patient is actually okay with going to the ER for evaluation today. 06/12/2019 on evaluation today patient actually appears to be doing significantly better compared to last time I saw her. Following when I last saw her she was actually in the hospital from that Monday until the following Sunday almost 1 full week. She actually was placed on Keflex in the hospital following the time for her to be discharged and Dr. Steva Ready has recommended 2 times a day dosing of the Keflex for the next year in order to help with more prophylactic/preventative measures with regard to her developing cellulitis. Overall I think this sounds like an excellent plan. The patient unfortunately is good to have trouble being treated at lymphedema clinic due to the fact that she really cannot get up on the bed that they have there. They also state that they cannot manage her as long as she has anything draining at this point. Obviously that is somewhat unfortunate as she does need help with edema control but nonetheless we will have to do what we can for her outside of it sounds like the lymphedema clinic scenario at this point. 06/19/2019 on evaluation today patient appears to be doing fairly well with regard to her bilateral lower extremities. She is not nearly as swollen Pacini, Yitty C. ([id number]) and shows no signs of infection at this point. There is no evidence of cellulitis whatsoever. She also has no open wounds or draining at this point which is also good news. No fever chills noted. She seems to be in very good spirits and in fact appears to be doing quite well. READMISSION 11/27/2019 This is a 66 year old woman that we have had in this clinic several times before including 2015, 16 and 19 and then most recently from 03/20/2019 through 06/19/2019 with bilateral lower extremity lymphedema. She has had previous arterial and reflux studies done years ago which were  not all that remarkable. 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 She does have a positive family history and she had large legs starting may be in her 75s. She was recently in hospital from 10/20/2019 through 10/28/2019 with right leg cellulitis. She was given Ancef and clindamycin and then Zosyn when a culture showed Pseudomonas. At that  time there was purulent drainage. She was followed by infectious disease Dr Steva Ready. The patient is now back at home. She has noted increased swelling in the right and no drainage in her right leg mostly on the posterior medial aspect in the calf area. She has not had pain or fever. She has literally been improved lysing above dressings because her at the area of this is far too large for standard compression. She has been wrapping the areas with sheets to resorptive pads. She is found these helped somewhat. She does have an appointment with the lymphedema clinic in [place] in late June. Past medical history includes bilateral lymphedema, hypertension, obstructive sleep apnea with CPAP. Recent hospitalization with apparently Pseudomonas cellulitis of the right lower leg 12/15/2019 upon evaluation today patient appears to be doing a little bit worse in regard to her right lower extremity. Unfortunately she is having more weeping down in the lower portion of her leg. Fortunately there is no signs of active infection at this time. No fever chills noted. The patient states she is not having increased pain except for when she attempted to use the lymphedema pumps unfortunately she states that she did have pain when she did this. Otherwise we been using absorptive dressings of one type or another she is using diapers at home and then subsequently Ace wraps. In regard to the barrier cream we have discussed the possibility of derma cloud which she would like to try I do not have a problem with that. 12/22/2019 upon  evaluation today patient actually appears to be doing better in regard to her leg ulcers at this point. Fortunately there does not appear to be any signs of active infection which is great news and I am extremely pleased with where things are progressing at this time. There is no sign of active infection currently. The patient is very pleased to see things doing so well. 12/29/2019 upon evaluation today patient appears to be doing a little bit better in regard to her weeping in general over her lower extremities. She does have some signs of mild erythema little bit more than what I noted last week or rather last visit. Nonetheless I think that my threshold for switching her antibiotics from Keflex to something else is very low at this point considering that she has had such severe infections in the past that seem to come almost out of nowhere. There is a little erythema and warmth noted of the lower portion of her leg compared to the upper which also makes me want to go ahead and address things more rapidly at this point. Likely I would switch out the Keflex for something like Levaquin ideally. 7/16; patient with severe bilateral lymphedema. She has superficial wounds albeit almost circumferential now on the left lateral lower leg. This may be new from last time. Small area on the right anterior lower leg and then another area on the right medial lower leg and of pannus fold. She has been using various absorptive garments. She states she is using her compression pumps once a day occasionally twice. Culture from her last visit here was negative 01/29/2020 on evaluation today patient appears to be doing excellent at this point in regard to her legs with regard to infection I see no signs of active infection at this point. She still does have unfortunately areas of weeping this is minimal on the right now her left is actually significantly worse although I do not think it is as bad as  last week with Dr.  Dellia Nims saw her. She has been trying to pump and elevate her legs is much as possible. She has previously been on the Keflex and in the past for prevention that seems to do fairly well and likely can extend that today. 02/04/2020 on evaluation today patient appears to be doing better in regard to her legs bilaterally. Fortunately there is no signs of active infection at this time which is great news and overall she has less weeping on the left compared to the right and there is several spots where she is pretty much sealed up with no draining regions. Overall very pleased in this regard. 02/19/2020 on evaluation today patient appears to be doing very well in regard to her wounds currently. Fortunately there is no evidence of active infection overall very pleased with where things stand. She is significantly improved in regard to her edema I am extremely pleased in this regard she tells me that the popping no longer hurts and in fact she actually looks forward to it. 03/04/2020 on evaluation today patient appears to be doing excellent in regard to her lower extremities. Fortunately there is no signs of active infection at this time. No fevers, chills, nausea, vomiting, or diarrhea. 03/25/2020 on evaluation today patient appears to be doing a little bit more poorly in regard to her legs at this point. She tells me that she is still continue to have issues with drainage and this has been a little bit worse she was getting ready to start taking the Keflex again but wanted to see me first. Fortunately there is no signs of active infection at this time. No fevers, chills, nausea, vomiting, or diarrhea. 04/15/2020 upon evaluation today patient appears to be doing somewhat poorly in regard to her right leg. She tells me she has been having more pain she has been taking the Keflex that was previously prescribed unfortunately that just does not seem to help with this. She was hoping that the pain on her right was  actually coming from the fact that she was having issues with her wrap having gotten caught in her recliner. With that being said she tells me that she knew something was not right. Currently her right leg is warm to touch along with being erythematous all the way up to around at least mid thigh as far as I can see. The left leg does not appear to be doing that badly though there is increased weeping around the ankle region. 05/06/2020 on evaluation today patient appears to be doing much better than last time I saw her. She did go to the hospital where she was admitted for 2 days and treated with antibiotic therapy. She was discharged with antibiotics as well and has done extremely well. I am extremely pleased with where things stand today. There is no signs of active infection at this time which is great news. 05/27/2020 upon evaluation today patient appears to be doing well with regard to her lower extremities bilaterally. She has just a very tiny area on the right leg which is opening on the left leg she is significantly improved though she still has several areas that do appear to be open this is minimal compared to what is been in the past. In general I am extremely pleased with where things stand today. The patient does tell me she is not been using her pumps quite as much as she should be. I do believe that is 1 area she can definitely  work on. She has had a lot going on including a Covid exposure and apparently also a outbreak of likely shingles. DEVA, RON ([id number]) 06/24/2020 upon evaluation today patient appears to be doing well with regard to her legs in general although the left leg unfortunately is showing some signs of erythema she does have a little bit of increased weeping and to be honest I am concerned about infection here. I discussed that with her today and I think that we may need to address this sooner rather than later she has been taking Keflex she is not really  certain that is been making a big improvement however. No fevers, chills, nausea, vomiting, or diarrhea. 06/30/2020 upon evaluation today patient's legs actually seem to be doing better in my opinion as compared to where they were last week. Fortunately there does not appear to be any signs of active infection. Her culture showed multiple organisms nothing predominate. With that being said the Levaquin seems to have done well I think she has improved since I last saw her as well. 07/14/2020 upon evaluation today patient appears to be doing actually better in regard to her lower extremities in my opinion. She has been tolerating the dressing changes without complication. Fortunately there is no signs of active infection at this time. 08/04/2020 upon evaluation today patient appears to be doing excellent in regard to her leg ulcers. Fortunately she has very little that is open at this point. This is great news. In fact I think that the Goldbond medicated powder has been excellent for her. It seems to have done the trick where we had tried several other things without as much success. Fortunately there is no evidence of active infection at this time. No fevers, chills, nausea, vomiting, or diarrhea. 09/01/2020 upon evaluation today patient appears to be doing a little bit more poorly than the last time I saw her. She tells me right now that she has been having a lot of drainage compared to where things were previous which has unfortunately led to more irritation as well. She is concerned that this is leading to infection. Again based on what I am seeing today as well I am also concerned of the same to be honest. 09/15/2020 upon evaluation today patient appears to be doing well with regard to her legs compared to what I saw previous. Fortunately there does not appear to be any evidence of active infection at this time which is great news. At least not as badly as what it was previous. With that being said I  do believe that the patient does need to have an extension of the antibiotics. This I believe will actually help her more in the way of making sure this stays under control and does not worsen. That was the Bactrim DS. Readmission: 08/01/2021 upon evaluation today patient appears to be doing actually pretty well all things considered. Is actually been almost a year since have seen her last March. Fortunately I do not see any evidence of active infection locally nor systemically at this point. She does have a couple open areas on the left leg the right leg appears to be doing quite well. In general she has been in the hospital 3 times since I saw her a year ago all 4 cellulitis type issues except for the last 1 which was actually more related to congestive heart failure for that reason she is not using her lymphedema pumps at this point and that is probably the best idea.  08/08/2021 upon evaluation today patient appears to be doing well with regard to her legs all things considered her left leg may be a little bit more swollen based on what I see today. Fortunately I do not see any signs of active infection locally nor systemically at this time which is great news. No fevers, chills, nausea, vomiting, or diarrhea. 08/15/2021 upon evaluation today patient actually appears to be doing excellent in regard to her wounds. She has been tolerating the dressing changes without complication. Fortunately I see no evidence of infection currently which is great news. No fevers, chills, nausea, vomiting, or diarrhea. 08/29/2021 upon evaluation patient appears to be doing excellent she in fact is almost completely healed. I am actually very pleased with where we stand today and I think that she is making wonderful progress she just has a small area of weeping on the right lateral leg everything else is pretty much completely healed which is also. 09/05/2021 upon evaluation today patient appears to be doing well with  regard to her legs. She does have a small area of weeping on the right leg and there is a small area on the left leg as well. It is potentially something that may need to be addressed in the future more specifically but right now I think that there may have just been a little drainage here on the left. With all that being said I think that this still does not appear to be infected and overall I feel like she is doing quite well. That something we always have to keep a very close eye on as she can change very rapidly from okay to not okay. Objective Constitutional Obese and well-hydrated in no acute distress. Vitals Time Taken: 11:40 AM, Height: 63 in, Weight: 372 lbs, BMI: 65.9, Temperature: 97.8 F, Pulse: 69 bpm, Respiratory Rate: 18 breaths/min, Blood Pressure: 143/78 mmHg. Respiratory normal breathing without difficulty. Psychiatric this patient is able to make decisions and demonstrates good insight into disease process. Alert and Oriented x 3. pleasant and cooperative. General Notes: Upon inspection patient's wound bed actually showed signs of good granulation and epithelization at this point. Fortunately I do not see any evidence of active infection locally nor systemically which is great news and overall I am extremely pleased with where we stand Datta, Karesha C. ([id number]) today. Integumentary (Hair, Skin) Wound #9 status is Open. Original cause of wound was Blister. The date acquired was: 07/31/2021. The wound has been in treatment 4 weeks. The wound is located on the Right Lower Leg. The wound measures 2cm length x 1.5cm width x 0.1cm depth; 2.356cm^2 area and 0.236cm^3 volume. There is Fat Layer (Subcutaneous Tissue) exposed. There is no tunneling or undermining noted. There is a medium amount of serosanguineous drainage noted. There is large (67-100%) pink granulation within the wound bed. There is no necrotic tissue within the wound bed. 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 Nurse Visit as needed Bathing/ Shower/ Hygiene: May shower; gently cleanse wound with antibacterial soap, rinse and pat dry prior to dressing wounds WOUND #9: - Lower Leg Wound Laterality: Right Primary Dressing: Silvercel 4 1/4x 4 1/4 (in/in) 1 x Per Day/30 Days Discharge Instructions: Apply Silvercel 4 1/4x 4 1/4 (in/in) as instructed Secondary Dressing: ABD Pad 5x9 (in/in) 1  x Per Day/30 Days Discharge Instructions: Cover with ABD pad Secured With: 18M ACE Elastic Bandage With VELCRO Brand Closure, 4 (in) 1 x Per Day/30 Days Secured With: 18M Medipore H Soft Cloth Surgical Tape, 2x2 (in/yd) 1 x Per Day/30 Days 1. Would recommend that we going continue with the wound care measures as before and the patient is in agreement with plan. Wires and silver alginate to the area of opening and weeping on both legs. 2. We will also get a continue with the roll gauze and Ace wrap to secure in place. 3. We will also continue with the checks which are what she uses to catch the excessive drainage I think that still good to continue with as well. 4. We did discuss the possibility of utilizing her compression pumps but his lymphedema pumps. That something that she is going to attempt and try and see if that could be beneficial as well. We will see patient back for reevaluation in 1 week here in the clinic. If anything worsens or changes patient will contact our office for additional recommendations. Electronic Signature(s) Signed: 09/05/2021 1:32:40 PM By: Worthy Keeler PA-C Entered By: Worthy Keeler on 09/05/2021 13:32:40 Maskell, [name]  ([id number]) -------------------------------------------------------------------------------- SuperBill Details Patient Name: [name] Date of Service: 09/05/2021 Medical Record Number: [id number] Patient Account Number: [id number] Date of Birth/Sex: 15-Sep-1955 (66 y.o. F) Treating RN: [name] Dredge Primary Care Provider: Ranae Plumber Other Clinician: Referring Provider: Ranae Plumber Treating Provider/Extender: Skipper Cliche in Treatment: 5 Diagnosis Coding ICD-10 Codes Code Description Q82.0 Hereditary lymphedema L97.811 Non-pressure chronic ulcer of other part of right lower leg limited to breakdown of skin L97.821 Non-pressure chronic ulcer of other part of left lower leg limited to breakdown of skin Facility Procedures CPT4 Code: [id number] Description: [phone] - WOUND CARE VISIT-LEV 2 EST PT Modifier: Quantity: 1 Physician Procedures CPT4 Code: 0100712 Description: 19758 - WC PHYS LEVEL 4 - EST PT Modifier: Quantity: 1 CPT4 Code: Description: ICD-10 Diagnosis Description Q82.0 Hereditary lymphedema L97.811 Non-pressure chronic ulcer of other part of right lower leg limited to bre L97.821 Non-pressure chronic ulcer of other part of left lower leg limited to brea Modifier: akdown of skin kdown of skin Quantity: Electronic Signature(s) Signed: 09/05/2021 1:35:16 PM By: Worthy Keeler PA-C Entered By: Worthy Keeler on 09/05/2021 13:35:16

## 2021-09-05 NOTE — Progress Notes (Addendum)
Elizabeth, [name] ([id number]) Visit Report for 09/05/2021 Arrival Information Details Patient Name: [name], [name]. Date of Service: 09/05/2021 11:15 AM Medical Record Number: [id number] Patient Account Number: [id number] Date of Birth/Sex: July 13, 1955 (66 y.o. F) Treating RN: [name] Dredge Primary Care Valora Norell: Ranae Plumber Other Clinician: Referring Izayiah Tibbitts: Ranae Plumber Treating Jerrid Forgette/Extender: Skipper Cliche in Treatment: 5 Visit Information History Since Last Visit Added or deleted any medications: No Patient Arrived: [name] Any new allergies or adverse reactions: No Arrival Time: 11:39 Had a fall or experienced change in No Accompanied By: self activities of daily living that may affect Transfer Assistance: None risk of falls: Patient Identification Verified: Yes Hospitalized since last visit: No Secondary Verification Process Completed: Yes Has Dressing in Place as Prescribed: Yes Pain Present Now: No Electronic Signature(s) Signed: 09/05/2021 4:37:03 PM By: [name] Dredge Entered By: [name] Dredge on 09/05/2021 11:39:34 Pietsch, [name] ([id number]) -------------------------------------------------------------------------------- Clinic Level of Care Assessment Details Patient Name: [name]. Date of Service: 09/05/2021 11:15 AM Medical Record Number: [id number] Patient Account Number: [id number] Date of Birth/Sex: 12-Jul-1955 (66 y.o. F) Treating RN: [name] Dredge Primary Care Conrad Zajkowski: Ranae Plumber Other Clinician: Referring Arnitra Sokoloski: Ranae Plumber Treating Saralynn Langhorst/Extender: Skipper Cliche in Treatment: 5 Clinic Level of Care Assessment Items TOOL 4 Quantity Score []  - Use when only an EandM is performed on FOLLOW-UP visit 0 ASSESSMENTS - Nursing Assessment / Reassessment []  - Reassessment of Co-morbidities (includes updates in patient status) 0 X- 1 5 Reassessment of Adherence to Treatment Plan ASSESSMENTS - Wound and Skin  Assessment / Reassessment X - Simple Wound Assessment / Reassessment - one wound 1 5 []  - 0 Complex Wound Assessment / Reassessment - multiple wounds []  - 0 Dermatologic / Skin Assessment (not related to wound area) ASSESSMENTS - Focused Assessment []  - Circumferential Edema Measurements - multi extremities 0 []  - 0 Nutritional Assessment / Counseling / Intervention []  - 0 Lower Extremity Assessment (monofilament, tuning fork, pulses) []  - 0 Peripheral Arterial Disease Assessment (using hand held doppler) ASSESSMENTS - Ostomy and/or Continence Assessment and Care []  - Incontinence Assessment and Management 0 []  - 0 Ostomy Care Assessment and Management (repouching, etc.) PROCESS - Coordination of Care X - Simple Patient / Family Education for ongoing care 1 15 []  - 0 Complex (extensive) Patient / Family Education for ongoing care []  - 0 Staff obtains Programmer, systems, Records, Test Results / Process Orders []  - 0 Staff telephones HHA, Nursing Homes / Clarify orders / etc []  - 0 Routine Transfer to another Facility (non-emergent condition) []  - 0 Routine Hospital Admission (non-emergent condition) []  - 0 New Admissions / Biomedical engineer / Ordering NPWT, Apligraf, etc. []  - 0 Emergency Hospital Admission (emergent condition) X- 1 10 Simple Discharge Coordination []  - 0 Complex (extensive) Discharge Coordination PROCESS - Special Needs []  - Pediatric / Minor Patient Management 0 []  - 0 Isolation Patient Management []  - 0 Hearing / Language / Visual special needs []  - 0 Assessment of Community assistance (transportation, D/C planning, etc.) []  - 0 Additional assistance / Altered mentation []  - 0 Support Surface(s) Assessment (bed, cushion, seat, etc.) INTERVENTIONS - Wound Cleansing / Measurement Durflinger, Mireya C. ([id number]) X- 1 5 Simple Wound Cleansing - one wound []  - 0 Complex Wound Cleansing - multiple wounds X- 1 5 Wound Imaging (photographs - any number  of wounds) []  - 0 Wound Tracing (instead of photographs) X- 1 5 Simple Wound Measurement - one wound []  - 0 Complex Wound  Measurement - multiple wounds INTERVENTIONS - Wound Dressings X - Small Wound Dressing one or multiple wounds 1 10 []  - 0 Medium Wound Dressing one or multiple wounds []  - 0 Large Wound Dressing one or multiple wounds []  - 0 Application of Medications - topical []  - 0 Application of Medications - injection INTERVENTIONS - Miscellaneous []  - External ear exam 0 []  - 0 Specimen Collection (cultures, biopsies, blood, body fluids, etc.) []  - 0 Specimen(s) / Culture(s) sent or taken to Lab for analysis []  - 0 Patient Transfer (multiple staff / Civil Service fast streamer / Similar devices) []  - 0 Simple Staple / Suture removal (25 or less) []  - 0 Complex Staple / Suture removal (26 or more) []  - 0 Hypo / Hyperglycemic Management (close monitor of Blood Glucose) []  - 0 Ankle / Brachial Index (ABI) - do not check if billed separately X- 1 5 Vital Signs Has the patient been seen at the hospital within the last three years: Yes Total Score: 65 Level Of Care: New/Established - Level 2 Electronic Signature(s) Signed: 09/05/2021 4:37:03 PM By: [name] Dredge Entered By: [name] Dredge on 09/05/2021 12:09:51 [name] ([id number]) -------------------------------------------------------------------------------- Encounter Discharge Information Details Patient Name: [name]. Date of Service: 09/05/2021 11:15 AM Medical Record Number: [id number] Patient Account Number: [id number] Date of Birth/Sex: 12-30-1955 (66 y.o. F) Treating RN: [name] Dredge Primary Care Ada Woodbury: Ranae Plumber Other Clinician: Referring Dallis Darden: Ranae Plumber Treating Arora Coakley/Extender: Skipper Cliche in Treatment: 5 Encounter Discharge Information Items Discharge Condition: Stable Ambulatory Status: Cane Discharge Destination: Home Transportation: Private Auto Accompanied  By: self Schedule Follow-up Appointment: Yes Clinical Summary of Care: Electronic Signature(s) Signed: 09/05/2021 4:37:03 PM By: [name] Dredge Entered By: [name] Dredge on 09/05/2021 12:10:48 [name] ([id number]) -------------------------------------------------------------------------------- Lower Extremity Assessment Details Patient Name: [name]. Date of Service: 09/05/2021 11:15 AM Medical Record Number: [id number] Patient Account Number: [id number] Date of Birth/Sex: Nov 04, 1955 (66 y.o. F) Treating RN: [name] Dredge Primary Care Jovan Colligan: Ranae Plumber Other Clinician: Referring Jumana Paccione: Ranae Plumber Treating Idelia Caudell/Extender: Skipper Cliche in Treatment: 5 Edema Assessment Assessed: [Left: No] [Right: No] Edema: [Left: Yes] [Right: Yes] Calf Left: Right: Point of Measurement: 39 cm From Medial Instep 81 cm 80 cm Ankle Left: Right: Point of Measurement: 9 cm From Medial Instep 50 cm 59 cm Vascular Assessment Pulses: Dorsalis Pedis Palpable: [Left:Yes] [Right:Yes] Electronic Signature(s) Signed: 09/05/2021 4:37:03 PM By: [name] Dredge Entered By: [name] Dredge on 09/05/2021 11:51:57 Lokken, [name] ([id number]) -------------------------------------------------------------------------------- Multi Wound Chart Details Patient Name: [name]. Date of Service: 09/05/2021 11:15 AM Medical Record Number: [id number] Patient Account Number: [id number] Date of Birth/Sex: 1956-07-07 (66 y.o. F) Treating RN: [name] Dredge Primary Care Duston Smolenski: Ranae Plumber Other Clinician: Referring Newel Oien: Ranae Plumber Treating Marcellus Pulliam/Extender: Skipper Cliche in Treatment: 5 Vital Signs Height(in): 49 Pulse(bpm): 48 Weight(lbs): 976 Blood Pressure(mmHg): 143/78 Body Mass Index(BMI): 65.9 Temperature(F): 97.8 Respiratory Rate(breaths/min): 18 Photos: [N/A:N/A] Wound Location: Right Lower Leg N/A N/A Wounding Event: Blister N/A  N/A Primary Etiology: Lymphedema N/A N/A Comorbid History: Cataracts, Chronic sinus N/A N/A problems/congestion, Anemia, Lymphedema, Asthma, Sleep Apnea, Tuberculosis, Angina, Hypertension, Gout, Osteoarthritis Date Acquired: 07/31/2021 N/A N/A Weeks of Treatment: 4 N/A N/A Wound Status: Open N/A N/A Wound Recurrence: No N/A N/A Clustered Wound: Yes N/A N/A Measurements L x W x D (cm) 2x1.5x0.1 N/A N/A Area (cm) : 2.356 N/A N/A Volume (cm) : 0.236 N/A N/A % Reduction in Area: 81.00% N/A N/A % Reduction  in Volume: 80.90% N/A N/A Classification: Full Thickness Without Exposed N/A N/A Support Structures Exudate Amount: Medium N/A N/A Exudate Type: Serosanguineous N/A N/A Exudate Color: red, brown N/A N/A Granulation Amount: Large (67-100%) N/A N/A Granulation Quality: Pink N/A N/A Necrotic Amount: None Present (0%) N/A N/A Exposed Structures: Fat Layer (Subcutaneous Tissue): N/A N/A Yes Epithelialization: Medium (34-66%) N/A N/A Treatment Notes Electronic Signature(s) Signed: 09/05/2021 4:37:03 PM By: [name] Dredge Entered By: [name] Dredge on 09/05/2021 11:53:27 Risden, [name] ([id number]) -------------------------------------------------------------------------------- T[place]ine Details Patient Name: [name]. Date of Service: 09/05/2021 11:15 AM Medical Record Number: [id number] Patient Account Number: [id number] Date of Birth/Sex: [birth year] (66 y.o. F) Treating RN: [name] Dredge Primary Care Catheryne Deford: Ranae Plumber Other Clinician: Referring Suha Schoenbeck: Ranae Plumber Treating Kemet Nijjar/Extender: Skipper Cliche in Treatment: 5 Active Inactive Orientation to the Wound Care Program Nursing Diagnoses: Knowledge deficit related to the wound healing center program Goals: Patient/caregiver will verbalize understanding of the [facility] Date Initiated: 08/01/2021 Target Resolution Date: 08/01/2021 Goal Status:  Active Interventions: Provide education on orientation to the wound center Notes: Wound/Skin Impairment Nursing Diagnoses: Impaired tissue integrity Knowledge deficit related to ulceration/compromised skin integrity Goals: Ulcer/skin breakdown will have a volume reduction of 30% by week 4 Date Initiated: 08/01/2021 Target Resolution Date: 08/29/2021 Goal Status: Active Ulcer/skin breakdown will have a volume reduction of 50% by week 8 Date Initiated: 08/01/2021 Target Resolution Date: 09/26/2021 Goal Status: Active Ulcer/skin breakdown will have a volume reduction of 80% by week 12 Date Initiated: 08/01/2021 Target Resolution Date: 10/24/2021 Goal Status: Active Ulcer/skin breakdown will heal within 14 weeks Date Initiated: 08/01/2021 Target Resolution Date: 11/07/2021 Goal Status: Active Interventions: Assess patient/caregiver ability to obtain necessary supplies Assess patient/caregiver ability to perform ulcer/skin care regimen upon admission and as needed Assess ulceration(s) every visit Provide education on ulcer and skin care Notes: Electronic Signature(s) Signed: 09/05/2021 4:37:03 PM By: [name] Dredge Entered By: [name] Dredge on 09/05/2021 11:53:18 Foor, [name] ([id number]) -------------------------------------------------------------------------------- Pain Assessment Details Patient Name: [name]. Date of Service: 09/05/2021 11:15 AM Medical Record Number: [id number] Patient Account Number: [id number] Date of Birth/Sex: [birth year] (66 y.o. F) Treating RN: [name] Dredge Primary Care Geremiah Fussell: Ranae Plumber Other Clinician: Referring Destinae Neubecker: Ranae Plumber Treating Yolande Skoda/Extender: Skipper Cliche in Treatment: 5 Active Problems Location of Pain Severity and Description of Pain Patient Has Paino No Site Locations Rate the pain. Current Pain Level: 0 Pain Management and Medication Current Pain Management: Electronic Signature(s) Signed:  09/05/2021 4:37:03 PM By: [name] Dredge Entered By: [name] Dredge on 09/05/2021 11:42:43 Devers, [name] ([id number]) -------------------------------------------------------------------------------- Patient/Caregiver Education Details Patient Name: [name]. Date of Service: 09/05/2021 11:15 AM Medical Record Number: [id number] Patient Account Number: [id number] Date of Birth/Gender: 1955-10-04 (66 y.o. F) Treating RN: [name] Dredge Primary Care Physician: Ranae Plumber Other Clinician: Referring Physician: Ranae Plumber Treating Physician/Extender: Skipper Cliche in Treatment: 5 Education Assessment Education Provided To: Patient Education Topics Provided Wound/Skin Impairment: Handouts: Caring for Your Ulcer Methods: Explain/Verbal Responses: State content correctly Electronic Signature(s) Signed: 09/05/2021 4:37:03 PM By: [name] Dredge Entered By: [name] Dredge on 09/05/2021 12:10:14 [name] ([id number]) -------------------------------------------------------------------------------- Wound Assessment Details Patient Name: [name] Date of Service: 09/05/2021 11:15 AM Medical Record Number: [id number] Patient Account Number: [id number] Date of Birth/Sex: 06/26/1956 (66 y.o. F) Treating RN: [name] Dredge Primary Care Rejeana Fadness: Ranae Plumber Other Clinician: Referring Tywana Robotham: Ranae Plumber Treating Tahesha Skeet/Extender: Skipper Cliche in Treatment: 5  Wound Status Wound Number: 9 Primary Lymphedema Etiology: Wound Location: Right Lower Leg Wound Open Wounding Event: Blister Status: Date Acquired: 07/31/2021 Comorbid Cataracts, Chronic sinus problems/congestion, Anemia, Weeks Of Treatment: 4 History: Lymphedema, Asthma, Sleep Apnea, Tuberculosis, Angina, Clustered Wound: Yes Hypertension, Gout, Osteoarthritis Photos Wound Measurements Length: (cm) 2 Width: (cm) 1.5 Depth: (cm) 0.1 Area: (cm) 2.356 Volume: (cm) 0.236 %  Reduction in Area: 81% % Reduction in Volume: 80.9% Epithelialization: Medium (34-66%) Tunneling: No Undermining: No Wound Description Classification: Full Thickness Without Exposed Support Structu Exudate Amount: Medium Exudate Type: Serosanguineous Exudate Color: red, brown res Foul Odor After Cleansing: No Slough/Fibrino No Wound Bed Granulation Amount: Large (67-100%) Exposed Structure Granulation Quality: Pink Fat Layer (Subcutaneous Tissue) Exposed: Yes Necrotic Amount: None Present (0%) Treatment Notes Wound #9 (Lower Leg) Wound Laterality: Right Cleanser Peri-Wound Care Topical Primary Dressing Silvercel 4 1/4x 4 1/4 (in/in) Discharge Instruction: Apply Silvercel 4 1/4x 4 1/4 (in/in) as instructed GENOVEVA, SINGLETON ([id number]) Secondary Dressing ABD Pad 5x9 (in/in) Discharge Instruction: Cover with ABD pad Secured With 62M ACE Elastic Bandage With VELCRO Brand Closure, 4 (in) 62M Medipore H Soft Cloth Surgical Tape, 2x2 (in/yd) Compression Wrap Compression Stockings Add-Ons Electronic Signature(s) Signed: 09/05/2021 4:37:03 PM By: [name] Dredge Entered By: [name] Dredge on 09/05/2021 11:51:13 Chuang, [name] ([id number]) -------------------------------------------------------------------------------- Vitals Details Patient Name: [name]. Date of Service: 09/05/2021 11:15 AM Medical Record Number: [id number] Patient Account Number: [id number] Date of Birth/Sex: [birth year] (66 y.o. F) Treating RN: [name] Dredge Primary Care Harlee Pursifull: Ranae Plumber Other Clinician: Referring Kara Melching: Ranae Plumber Treating Khylie Larmore/Extender: Skipper Cliche in Treatment: 5 Vital Signs Time Taken: 11:40 Temperature (F): 97.8 Height (in): 63 Pulse (bpm): 69 Weight (lbs): 372 Respiratory Rate (breaths/min): 18 Body Mass Index (BMI): 65.9 Blood Pressure (mmHg): 143/78 Reference Range: 80 - 120 mg / dl Electronic Signature(s) Signed: 09/05/2021 4:37:03 PM  By: [name] Dredge Entered By: [name] Dredge on 09/05/2021 11:42:36

## 2021-09-12 ENCOUNTER — Other Ambulatory Visit: Payer: Self-pay

## 2021-09-12 ENCOUNTER — Encounter: Payer: Medicare HMO | Attending: Physician Assistant | Admitting: Physician Assistant

## 2021-09-12 DIAGNOSIS — M109 Gout, unspecified: Secondary | ICD-10-CM | POA: Insufficient documentation

## 2021-09-12 DIAGNOSIS — E669 Obesity, unspecified: Secondary | ICD-10-CM | POA: Diagnosis not present

## 2021-09-12 DIAGNOSIS — Z6841 Body Mass Index (BMI) 40.0 and over, adult: Secondary | ICD-10-CM | POA: Insufficient documentation

## 2021-09-12 DIAGNOSIS — J45909 Unspecified asthma, uncomplicated: Secondary | ICD-10-CM | POA: Diagnosis not present

## 2021-09-12 DIAGNOSIS — L97821 Non-pressure chronic ulcer of other part of left lower leg limited to breakdown of skin: Secondary | ICD-10-CM | POA: Insufficient documentation

## 2021-09-12 DIAGNOSIS — M1990 Unspecified osteoarthritis, unspecified site: Secondary | ICD-10-CM | POA: Diagnosis not present

## 2021-09-12 DIAGNOSIS — Q820 Hereditary lymphedema: Secondary | ICD-10-CM | POA: Diagnosis not present

## 2021-09-12 DIAGNOSIS — L97811 Non-pressure chronic ulcer of other part of right lower leg limited to breakdown of skin: Secondary | ICD-10-CM | POA: Insufficient documentation

## 2021-09-12 DIAGNOSIS — G4733 Obstructive sleep apnea (adult) (pediatric): Secondary | ICD-10-CM | POA: Diagnosis not present

## 2021-09-12 DIAGNOSIS — I10 Essential (primary) hypertension: Secondary | ICD-10-CM | POA: Insufficient documentation

## 2021-09-12 NOTE — Progress Notes (Signed)
ADELMA, BOWDOIN ([id number]) Visit Report for 09/12/2021 Arrival Information Details Patient Name: [name], [name]. Date of Service: 09/12/2021 3:00 PM Medical Record Number: [id number] Patient Account Number: [id number] Date of Birth/Sex: 17-Jan-1956 (66 y.o. F) Treating RN: [name] Dredge Primary Care Emilynn Srinivasan: Ranae Plumber Other Clinician: Referring Kaynan Klonowski: Ranae Plumber Treating Kyra Laffey/Extender: Skipper Cliche in Treatment: 6 Visit Information History Since Last Visit Added or deleted any medications: No Patient Arrived: [name] Any new allergies or adverse reactions: No Arrival Time: 15:19 Had a fall or experienced change in No Accompanied By: self activities of daily living that may affect Transfer Assistance: None risk of falls: Patient Identification Verified: Yes Hospitalized since last visit: No Secondary Verification Process Completed: Yes Has Dressing in Place as Prescribed: Yes Pain Present Now: No Electronic Signature(s) Signed: 09/12/2021 5:07:45 PM By: [name] Dredge Entered By: [name] Dredge on 09/12/2021 15:21:28 [name] ([id number]) -------------------------------------------------------------------------------- Clinic Level of Care Assessment Details Patient Name: [name]. Date of Service: 09/12/2021 3:00 PM Medical Record Number: [id number] Patient Account Number: [id number] Date of Birth/Sex: [birth year] (66 y.o. F) Treating RN: [name] Dredge Primary Care Brigid Vandekamp: Ranae Plumber Other Clinician: Referring Caige Almeda: Ranae Plumber Treating Nury Nebergall/Extender: Skipper Cliche in Treatment: 6 Clinic Level of Care Assessment Items TOOL 4 Quantity Score '[]'$  - Use when only an EandM is performed on FOLLOW-UP visit 0 ASSESSMENTS - Nursing Assessment / Reassessment '[]'$  - Reassessment of Co-morbidities (includes updates in patient status) 0 X- 1 5 Reassessment of Adherence to Treatment Plan ASSESSMENTS - Wound and Skin Assessment /  Reassessment X - Simple Wound Assessment / Reassessment - one wound 1 5 '[]'$  - 0 Complex Wound Assessment / Reassessment - multiple wounds '[]'$  - 0 Dermatologic / Skin Assessment (not related to wound area) ASSESSMENTS - Focused Assessment '[]'$  - Circumferential Edema Measurements - multi extremities 0 '[]'$  - 0 Nutritional Assessment / Counseling / Intervention '[]'$  - 0 Lower Extremity Assessment (monofilament, tuning fork, pulses) '[]'$  - 0 Peripheral Arterial Disease Assessment (using hand held doppler) ASSESSMENTS - Ostomy and/or Continence Assessment and Care '[]'$  - Incontinence Assessment and Management 0 '[]'$  - 0 Ostomy Care Assessment and Management (repouching, etc.) PROCESS - Coordination of Care X - Simple Patient / Family Education for ongoing care 1 15 '[]'$  - 0 Complex (extensive) Patient / Family Education for ongoing care '[]'$  - 0 Staff obtains Programmer, systems, Records, Test Results / Process Orders '[]'$  - 0 Staff telephones HHA, Nursing Homes / Clarify orders / etc '[]'$  - 0 Routine Transfer to another Facility (non-emergent condition) '[]'$  - 0 Routine Hospital Admission (non-emergent condition) '[]'$  - 0 New Admissions / Biomedical engineer / Ordering NPWT, Apligraf, etc. '[]'$  - 0 Emergency Hospital Admission (emergent condition) X- 1 10 Simple Discharge Coordination '[]'$  - 0 Complex (extensive) Discharge Coordination PROCESS - Special Needs '[]'$  - Pediatric / Minor Patient Management 0 '[]'$  - 0 Isolation Patient Management '[]'$  - 0 Hearing / Language / Visual special needs '[]'$  - 0 Assessment of Community assistance (transportation, D/C planning, etc.) '[]'$  - 0 Additional assistance / Altered mentation '[]'$  - 0 Support Surface(s) Assessment (bed, cushion, seat, etc.) INTERVENTIONS - Wound Cleansing / Measurement Buckner, Rhena C. ([id number]) X- 1 5 Simple Wound Cleansing - one wound '[]'$  - 0 Complex Wound Cleansing - multiple wounds X- 1 5 Wound Imaging (photographs - any number of wounds) '[]'$   - 0 Wound Tracing (instead of photographs) X- 1 5 Simple Wound Measurement - one wound '[]'$  - 0 Complex Wound  Measurement - multiple wounds INTERVENTIONS - Wound Dressings X - Small Wound Dressing one or multiple wounds 1 10 '[]'$  - 0 Medium Wound Dressing one or multiple wounds '[]'$  - 0 Large Wound Dressing one or multiple wounds '[]'$  - 0 Application of Medications - topical '[]'$  - 0 Application of Medications - injection INTERVENTIONS - Miscellaneous '[]'$  - External ear exam 0 '[]'$  - 0 Specimen Collection (cultures, biopsies, blood, body fluids, etc.) '[]'$  - 0 Specimen(s) / Culture(s) sent or taken to Lab for analysis '[]'$  - 0 Patient Transfer (multiple staff / Civil Service fast streamer / Similar devices) '[]'$  - 0 Simple Staple / Suture removal (25 or less) '[]'$  - 0 Complex Staple / Suture removal (26 or more) '[]'$  - 0 Hypo / Hyperglycemic Management (close monitor of Blood Glucose) '[]'$  - 0 Ankle / Brachial Index (ABI) - do not check if billed separately X- 1 5 Vital Signs Has the patient been seen at the hospital within the last three years: Yes Total Score: 65 Level Of Care: New/Established - Level 2 Electronic Signature(s) Signed: 09/12/2021 5:07:45 PM By: [name] Dredge Entered By: [name] Dredge on 09/12/2021 16:03:33 [place], [name] ([id number]) -------------------------------------------------------------------------------- Encounter Discharge Information Details Patient Name: [name]. Date of Service: 09/12/2021 3:00 PM Medical Record Number: [id number] Patient Account Number: [id number] Date of Birth/Sex: March 03, 1956 (66 y.o. F) Treating RN: [name] Dredge Primary Care Kaylum Shrum: Ranae Plumber Other Clinician: Referring Tonishia Steffy: Ranae Plumber Treating Meiya Wisler/Extender: Skipper Cliche in Treatment: 6 Encounter Discharge Information Items Post Procedure Vitals Discharge Condition: Stable Temperature (F): 98 Ambulatory Status: Cane Pulse (bpm): 73 Discharge Destination:  Home Respiratory Rate (breaths/min): 18 Transportation: Private Auto Blood Pressure (mmHg): 152/88 Accompanied By: self Schedule Follow-up Appointment: Yes Clinical Summary of Care: Electronic Signature(s) Signed: 09/12/2021 5:04:23 PM By: [name] Dredge Entered By: [name] Dredge on 09/12/2021 17:04:23 [place], [name] ([id number]) -------------------------------------------------------------------------------- Lower Extremity Assessment Details Patient Name: [name]. Date of Service: 09/12/2021 3:00 PM Medical Record Number: [id number] Patient Account Number: [id number] Date of Birth/Sex: July 28, 1955 (66 y.o. F) Treating RN: [name] Dredge Primary Care Florrie Ramires: Ranae Plumber Other Clinician: Referring Weston Fulco: Ranae Plumber Treating Carvel Huskins/Extender: Jeri Cos Weeks in Treatment: 6 Edema Assessment Assessed: [Left: No] [Right: No] Edema: [Left: Yes] [Right: Yes] Calf Left: Right: Point of Measurement: 39 cm From Medial Instep 80 cm 86 cm Ankle Left: Right: Point of Measurement: 9 cm From Medial Instep 47.5 cm 50.5 cm Electronic Signature(s) Signed: 09/12/2021 5:07:45 PM By: [name] Dredge Entered By: [name] Dredge on 09/12/2021 15:37:45 [place], [name] ([id number]) -------------------------------------------------------------------------------- Multi Wound Chart Details Patient Name: [name]. Date of Service: 09/12/2021 3:00 PM Medical Record Number: [id number] Patient Account Number: [id number] Date of Birth/Sex: [birth year] (66 y.o. F) Treating RN: [name] Dredge Primary Care Christene Pounds: Ranae Plumber Other Clinician: Referring Khori Underberg: Ranae Plumber Treating Deon Duer/Extender: Skipper Cliche in Treatment: 6 Vital Signs Height(in): 5 Pulse(bpm): 77 Weight(lbs): 527 Blood Pressure(mmHg): 152/88 Body Mass Index(BMI): 65.9 Temperature(F): 98 Respiratory Rate(breaths/min): 18 Photos: [N/A:N/A] Wound Location: Right Lower Leg N/A  N/A Wounding Event: Blister N/A N/A Primary Etiology: Lymphedema N/A N/A Comorbid History: Cataracts, Chronic sinus N/A N/A problems/congestion, Anemia, Lymphedema, Asthma, Sleep Apnea, Tuberculosis, Angina, Hypertension, Gout, Osteoarthritis Date Acquired: 07/31/2021 N/A N/A Weeks of Treatment: 5 N/A N/A Wound Status: Open N/A N/A Wound Recurrence: No N/A N/A Clustered Wound: Yes N/A N/A Measurements L x W x D (cm) 1x1.8x0.1 N/A N/A Area (cm) : 1.414 N/A N/A Volume (cm) : 0.141 N/A N/A %  Reduction in Area: 88.60% N/A N/A % Reduction in Volume: 88.60% N/A N/A Classification: Full Thickness Without Exposed N/A N/A Support Structures Exudate Amount: Medium N/A N/A Exudate Type: Serosanguineous N/A N/A Exudate Color: red, brown N/A N/A Granulation Amount: Large (67-100%) N/A N/A Granulation Quality: Pink N/A N/A Necrotic Amount: None Present (0%) N/A N/A Exposed Structures: Fat Layer (Subcutaneous Tissue): N/A N/A Yes Epithelialization: Medium (34-66%) N/A N/A GLENDI, MOHIUDDIN ([id number]) Treatment Notes Electronic Signature(s) Signed: 09/12/2021 5:07:45 PM By: [name] Dredge Entered By: [name] Dredge on 09/12/2021 [place], [place]. ([id number]) -------------------------------------------------------------------------------- [place] Details Patient Name: [name]. Date of Service: 09/12/2021 3:00 PM Medical Record Number: [id number] Patient Account Number: [id number] Date of Birth/Sex: 1955-10-31 (66 y.o. F) Treating RN: [name] Dredge Primary Care Raquelle Pietro: Ranae Plumber Other Clinician: Referring Rotha Cassels: Ranae Plumber Treating Shyler Holzman/Extender: Skipper Cliche in Treatment: 6 Active Inactive Orientation to the Wound Care Program Nursing Diagnoses: Knowledge deficit related to the wound healing center program Goals: Patient/caregiver will verbalize understanding of the [facility] Program Date Initiated:  08/01/2021 Target Resolution Date: 08/01/2021 Goal Status: Active Interventions: Provide education on orientation to the wound center Notes: Wound/Skin Impairment Nursing Diagnoses: Impaired tissue integrity Knowledge deficit related to ulceration/compromised skin integrity Goals: Ulcer/skin breakdown will have a volume reduction of 30% by week 4 Date Initiated: 08/01/2021 Target Resolution Date: 08/29/2021 Goal Status: Active Ulcer/skin breakdown will have a volume reduction of 50% by week 8 Date Initiated: 08/01/2021 Target Resolution Date: 09/26/2021 Goal Status: Active Ulcer/skin breakdown will have a volume reduction of 80% by week 12 Date Initiated: 08/01/2021 Target Resolution Date: 10/24/2021 Goal Status: Active Ulcer/skin breakdown will heal within 14 weeks Date Initiated: 08/01/2021 Target Resolution Date: 11/07/2021 Goal Status: Active Interventions: Assess patient/caregiver ability to obtain necessary supplies Assess patient/caregiver ability to perform ulcer/skin care regimen upon admission and as needed Assess ulceration(s) every visit Provide education on ulcer and skin care Notes: Electronic Signature(s) Signed: 09/12/2021 5:07:45 PM By: [name] Dredge Entered By: [name] Dredge on 09/12/2021 [place], [name] ([id number]) -------------------------------------------------------------------------------- Non-Wound Condition Assessment Details Patient Name: [name]. Date of Service: 09/12/2021 3:00 PM Medical Record Number: [id number] Patient Account Number: [id number] Date of Birth/Sex: 10-25-1955 (66 y.o. F) Treating RN: [name] Dredge Primary Care Cartrell Bentsen: Ranae Plumber Other Clinician: Referring Malarie Tappen: Ranae Plumber Treating Adam Demary/Extender: Skipper Cliche in Treatment: 6 Non-Wound Condition: Condition: Lymphedema Location: Leg Side: Left Notes: decreased drainage this visit. Photos Electronic Signature(s) Signed: 09/12/2021  5:07:45 PM By: [name] Dredge Entered By: [name] Dredge on 09/12/2021 15:40:09 Vayda, [name] ([id number]) -------------------------------------------------------------------------------- Pain Assessment Details Patient Name: MIKAIA, JANVIER. Date of Service: 09/12/2021 3:00 PM Medical Record Number: [id number] Patient Account Number: [id number] Date of Birth/Sex: Jun 05, 1956 (66 y.o. F) Treating RN: [name] Dredge Primary Care Elda Dunkerson: Ranae Plumber Other Clinician: Referring Marshawn Normoyle: Ranae Plumber Treating Providence Stivers/Extender: Skipper Cliche in Treatment: 6 Active Problems Location of Pain Severity and Description of Pain Patient Has Paino Yes Site Locations Rate the pain. Current Pain Level: 4 Pain Management and Medication Current Pain Management: Notes pt states muscle aches in bilat legs Electronic Signature(s) Signed: 09/12/2021 5:07:45 PM By: [name] Dredge Entered By: [name] Dredge on 09/12/2021 15:25:19 [place], [name] ([id number]) -------------------------------------------------------------------------------- Patient/Caregiver Education Details Patient Name: [name]. Date of Service: 09/12/2021 3:00 PM Medical Record Number: [id number] Patient Account Number: [id number] Date of Birth/Gender: 23-May-1956 (66 y.o. F) Treating RN: [name] Dredge Primary Care Physician: Ranae Plumber Other Clinician:  Referring Physician: Ranae Plumber Treating Physician/Extender: Skipper Cliche in Treatment: 6 Education Assessment Education Provided To: Patient Education Topics Provided Wound/Skin Impairment: Handouts: Caring for Your Ulcer Methods: Explain/Verbal Responses: State content correctly Electronic Signature(s) Signed: 09/12/2021 5:07:45 PM By: [name] Dredge Entered By: [name] Dredge on 09/12/2021 17:03:16 [place], [name] ([id number]) -------------------------------------------------------------------------------- Wound Assessment  Details Patient Name: [name] Date of Service: 09/12/2021 3:00 PM Medical Record Number: [id number] Patient Account Number: [id number] Date of Birth/Sex: Oct 17, 1955 (66 y.o. F) Treating RN: [name] Dredge Primary Care Chaniya Genter: Ranae Plumber Other Clinician: Referring Blessed Cotham: Ranae Plumber Treating Caileen Veracruz/Extender: Skipper Cliche in Treatment: 6 Wound Status Wound Number: 9 Primary Lymphedema Etiology: Wound Location: Right Lower Leg Wound Open Wounding Event: Blister Status: Date Acquired: 07/31/2021 Comorbid Cataracts, Chronic sinus problems/congestion, Anemia, Weeks Of Treatment: 5 History: Lymphedema, Asthma, Sleep Apnea, Tuberculosis, Angina, Clustered Wound: Yes Hypertension, Gout, Osteoarthritis Photos Wound Measurements Length: (cm) 1 Width: (cm) 1.8 Depth: (cm) 0.1 Area: (cm) 1.414 Volume: (cm) 0.141 % Reduction in Area: 88.6% % Reduction in Volume: 88.6% Epithelialization: Medium (34-66%) Tunneling: No Undermining: No Wound Description Classification: Full Thickness Without Exposed Support Structu Exudate Amount: Medium Exudate Type: Serosanguineous Exudate Color: red, brown res Foul Odor After Cleansing: No Slough/Fibrino No Wound Bed Granulation Amount: Large (67-100%) Exposed Structure Granulation Quality: Pink Fat Layer (Subcutaneous Tissue) Exposed: Yes Necrotic Amount: None Present (0%) Treatment Notes Wound #9 (Lower Leg) Wound Laterality: Right Cleanser Peri-Wound Care Topical Primary Dressing Silvercel 4 1/4x 4 1/4 (in/in) Discharge Instruction: Apply Silvercel 4 1/4x 4 1/4 (in/in) as instructed Blasdel, Zafira C. ([id number]) Secondary Dressing ABD Pad 5x9 (in/in) Discharge Instruction: Cover with ABD pad Kerlix 4.5 x 4.1 (in/yd) Discharge Instruction: Apply Kerlix 4.5 x 4.1 (in/yd) as instructed Secured With ACE WRAP - 84M ACE Elastic Bandage With VELCRO Brand Closure, 4 (in) Medipore Tape - 84M Medipore H Soft Cloth  Surgical Tape, 2x2 (in/yd) Compression Wrap Compression Stockings Add-Ons Electronic Signature(s) Signed: 09/12/2021 5:07:45 PM By: [name] Dredge Entered By: [name] Dredge on 09/12/2021 15:39:04 [place], [name] ([id number]) -------------------------------------------------------------------------------- Vitals Details Patient Name: [name]. Date of Service: 09/12/2021 3:00 PM Medical Record Number: [id number] Patient Account Number: [id number] Date of Birth/Sex: 10-16-1955 (66 y.o. F) Treating RN: [name] Dredge Primary Care Carmelo Reidel: Ranae Plumber Other Clinician: Referring Evan Osburn: Ranae Plumber Treating Shatana Saxton/Extender: Skipper Cliche in Treatment: 6 Vital Signs Time Taken: 15:23 Temperature (F): 98 Height (in): 63 Pulse (bpm): 73 Weight (lbs): 372 Respiratory Rate (breaths/min): 18 Body Mass Index (BMI): 65.9 Blood Pressure (mmHg): 152/88 Reference Range: 80 - 120 mg / dl Electronic Signature(s) Signed: 09/12/2021 5:07:45 PM By: [name] Dredge Entered By: [name] Dredge on 09/12/2021 15:25:13

## 2021-09-12 NOTE — Progress Notes (Addendum)
Elizabeth, [name] ([id number]) Visit Report for 09/12/2021 Chief Complaint Document Details Patient Name: [name], [name]. Date of Service: 09/12/2021 3:00 PM Medical Record Number: [id number] Patient Account Number: [id number] Date of Birth/Sex: [birth year] (66 y.o. F) Treating RN: [name] Dredge Primary Care Provider: Ranae Plumber Other Clinician: Referring Provider: Ranae Plumber Treating Provider/Extender: Skipper Cliche in Treatment: 6 Information Obtained from: Patient Chief Complaint Bilateral LE lymphedema with Left LE Ulcers Electronic Signature(s) Signed: 09/12/2021 3:55:03 PM By: Worthy Keeler PA-C Entered By: Worthy Keeler on 09/12/2021 15:55:03 Wigal, [name] ([id number]) -------------------------------------------------------------------------------- Debridement Details Patient Name: [name] Date of Service: 09/12/2021 3:00 PM Medical Record Number: [id number] Patient Account Number: [id number] Date of Birth/Sex: [birth year] (66 y.o. F) Treating RN: [name] Dredge Primary Care Provider: Ranae Plumber Other Clinician: Referring Provider: Ranae Plumber Treating Provider/Extender: Skipper Cliche in Treatment: 6 Debridement Performed for Wound #9 Right Lower Leg Assessment: Performed By: Physician Tommie Sams., PA-C Debridement Type: Chemical/Enzymatic/Mechanical Agent Used: saline gauze Level of Consciousness (Pre- Awake and Alert procedure): Pre-procedure Verification/Time Out Yes - 14:30 Taken: Instrument: Other : saline gauze Bleeding: None Response to Treatment: Procedure was tolerated well Level of Consciousness (Post- Awake and Alert procedure): Post Debridement Measurements of Total Wound Length: (cm) 1 Width: (cm) 1.8 Depth: (cm) 0.1 Volume: (cm) 0.141 Character of Wound/Ulcer Post Debridement: Stable Post Procedure Diagnosis Same as Pre-procedure Electronic Signature(s) Signed: 09/12/2021 5:02:13 PM By: [name] Dredge Signed: 09/12/2021 7:37:05 PM By: Worthy Keeler PA-C Entered By: [name] Dredge on 09/12/2021 17:02:13 Lory, [name] ([id number]) -------------------------------------------------------------------------------- HPI Details Patient Name: [name]. Date of Service: 09/12/2021 3:00 PM Medical Record Number: [id number] Patient Account Number: [id number] Date of Birth/Sex: 1955-11-23 (66 y.o. F) Treating RN: [name] Dredge Primary Care Provider: Ranae Plumber Other Clinician: Referring Provider: Ranae Plumber Treating Provider/Extender: Skipper Cliche in Treatment: 6 History of Present Illness HPI Description: The patient is a 66 year old female with history of hypertension and a long-standing history of bilateral lower extremity lymphedema (first presented on 4/2) . She has had open ulcers in the past which have always responded to compression therapy. She had briefly been to a lymphedema clinic in the past which helped her at the time. this time around she stopped treatment of her lymphedema pumps approximately 2 weeks ago because of some pain in the knees and then noticed the right leg getting worse. She was seen by her PCP who put her on clindamycin 4 times a day 2 days ago. The patient has seen [facility] and Dr. Delana Meyer had seen her last year where a vascular study including venous and arterial duplex studies were within normal limits. he had recommended compression stockings and lymphedema pumps and the patient has been using this in about 2 weeks ago. She is known to be diabetic but in the past few time she's gone to her primary care doctor her hemoglobin A1c has been normal. 02/11/2015 - after her last visit she took my advice and went to the ER regarding the progressive cellulitis of her right lower extremity and she was admitted between July 17 and 22nd. She received IV antibiotics and then was sent home on a course of steroid-induced and oral antibiotics. She has  improved much since then. 02/17/2015 -- she has been doing fine and the weeping of her legs has remarkably gone down. She has no fresh issues. READMISSION 01/15/18 This patient was given this clinic before most recently in 2016  seen by Dr. Con Memos. She has massive bilateral lymphedema and over the last 2 months this had weeping edema out of the left leg. She has compression pumps but her compliance with these has been minimal. She has [facility] they've been using TCA/ABDs/kerlix under an Ace wrap.she has had recent problems with cellulitis. She was apparently seen in the ER and 12/23/17 and given clindamycin. She was then followed by her primary doctor and given doxycycline and Keflex. The pain seems to have settled down. In April 2018 the patient had arterial studies done at [facility]. This showed triphasic waveforms throughout the right leg and mostly triphasic waveforms on the left except for monophasic at the posterior tibial artery distally. She was not felt to have evidence of right lower extremity arterial stenosis or significant problems on the left side. 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 She also had a right lower extremity venous Doppler in January 2018 this was limited by the patient's body habitus and lymphedema. Most of the proximal veins were not visualized The patient presents with an area of denuded skin on the anterior medial part of the left calf. There is weeping edema fluid here. 01/22/18; the patient has somewhat better edema control using her compression pumps twice a day and as a result she has much better epithelialization on the left anterior calf area. Only a small open area remains. 01/29/18; the patient has been compliant with her compression pumps. Both the areas on her calf that healed. The remaining area on the left anterior leg is fully epithelialized Readmission: 02/20/2019 upon evaluation today patient  presents for reevaluation due to issues that she is having with the bilateral lower extremities. She actually has wounds open on both legs. On the right she has an area in the crease of her leg on the right around the knee region which is actually draining quite a bit and actually has some fungal type appearance to it. She has been on nystatin powder that seems to have helped to some degree. In regard to the left lower extremity this is actually in the lower portion of her leg closer to the ankle and again is continuing to drain as well unfortunately. There does not appear to be any signs of active infection at this time which is good news. No fevers, chills, nausea, vomiting, or diarrhea. She tells me that since she was seen last year she is actually been doing quite well for the most part with regard to her lower extremities. Unfortunately she now is experiencing a little bit more drainage at this time. She is concerned about getting this under control so that it does not get significantly worse. 02/27/2019 on evaluation today patient appears to be doing somewhat better in regard to her bilateral lower extremity wounds. She has been tolerating the dressing changes without complication. Fortunately there is no signs of active infection at this point. No fevers, chills, nausea, vomiting, or diarrhea. She did get her dressing supplies which is excellent news she was extremely excited to get these. She also got paperwork from [employer] for their financial assistance program where they may be able to help her out in the future if needed with supplies at discounted prices. 03/06/2019 on evaluation today patient appears to be doing a little worse with regard to both areas of weeping on her bilateral lower extremities. This is around the right medial knee and just above the left ankle. With that being said she  is unfortunately not doing as well as I would like to see. I feel like she may need to potentially go  see someone at the lymphedema clinic as the wraps that she needs or even beyond what we can do here at the wound care center. She really does not have wounds she just has open areas of weeping that are causing some difficulty for her. Subsequently because of this and the moisture I am concerned about the potential for infection I am going to likely give her a prophylactic antibiotic today, Keflex, just to be on the safe side. Nonetheless again there is no obvious signs of active infection at this time. 03/13/2019 on evaluation today patient appears to be doing well with regard to her bilateral lower extremities where she has been weeping compared to even last week's evaluation. I see some areas of new skin growth which is excellent and overall I am very pleased with how things seem to be progressing. No fevers, chills, nausea, vomiting, or diarrhea. CAITLYNN, JU ([id number]) 03/20/2019 on evaluation today patient unfortunately is continuing to have issues with significant edema of the left lower extremity. Her right side seems to be doing much better. Unfortunately her left side is showing increased weeping of the lower portion of her leg. This is quite unfortunate obviously we were hoping to get her into the lymphedema clinic they really do not seem to when I see her how if she is draining. Despite the fact this is really not wound related but more lymphedema weeping related. Nonetheless I do not know that this can be helpful for her to even go for that appointment since again I am not sure there is much that they would actually do at this point. We may need to try a 4 layer compression wrap as best we can on her leg. She is on the Augmentin currently although I am still concerned about whether or not there could be potentially something going on infection wise I would obtain a culture though I understand is not the best being that is a surface culture I just 1 to make sure I do not seem to be  missing anything. 03/27/2019 on evaluation today patient appears to be doing much better in regard to the left lower extremity compared to last week. Last week she had tremendous weeping which I think was subsequent to infection now she seems to be doing much better and very pleased. This is not completely healed but there is a lot of new skin growth and it has dried out quite a bit. Overall I think that we are doing well with how things are moving along at this time. No fevers, chills, nausea, vomiting, or diarrhea. 04/03/2019 evaluation today patient appears to be doing a little worse this week compared to last time I saw her. I think this may be due to the fact that she is having issues with not being able to sleep in her bed at least not until last night. She is therefore been in a lift chair and subsequently has also had issues with not been able to use her pumps since she could not get in bed. With that being said the patient overall seems to be doing okay I do think I may want extend the antibiotic for a little bit longer at least until we can see if her edema and her weeping gets better and if it is then obviously I can always discontinue the antibiotics as of next week however  I want her to continue to have it over the next week. 04/10/2019 on evaluation today patient unfortunately is still doing poorly with regard to her left lower extremity. Her right is all things considering doing fairly well. On the left however she continues to have spreading of the area of infection and weeping which appears to be even a larger surface area than noted last week. She did have a positive culture for Pseudomonas in particular which seems to have been of concern she still has green/yellow discharge consistent with Pseudomonas and subsequently a tremendous amount of it. This has me obviously still concerned about the infection not really clearing up despite the fact that on culture it appears the Cipro should  have been a good option for treating this. I think she may at this point need IV antibiotics since things are not doing better I do not want to get worse and cause sepsis. She is in agreement with the plan and believes as well that she likely does need to go to the hospital for IV vancomycin. Or something of the like depending on what the recommendation is from the ER. 04/17/2019 on evaluation today patient appears to be doing excellent in regard to her lower extremity on the left. She was in the hospital for several days from when I sent her last we saw her until just this past Tuesday. Fortunately her drainage is significantly improved and in fact is mostly clear. There is just a couple small areas that may still drain a little bit she states that the [facility] they prescribed for her at discharge she went picked up from pharmacy and got home but has not been able to find it since. She is looked everywhere. She is wondering if I will replace that for her today I will be more than happy to do that. 05/01/2019 on evaluation today patient actually appears to be doing quite well with regard to her lower extremities. She occasionally is having areas that will leak and then heal up mainly when a piece of the fibrotic skin pops off but fortunately she is not having any signs of active infection at this time. Overall she also really does not have any obvious weeping at this time. I do believe however she really needs some compression wraps and I think this may be a good time to get her back to the lymphedema clinic. 05/11/2019 on evaluation today patient actually appears to be doing quite well with regard to her bilateral lower extremities. She occasionally will have a small area that we per another but in general seems to be completely healed which is great news. Overall very pleased with how everything seems to be progressing. She does have her appointment with lymphedema clinic on November 18. 05/25/2019 on  evaluation today patient appears to be doing well with regard to her left lower extremity. I am very pleased in this regard. In regard to her right leg this actually did start draining more I think it is mainly due to the fact that her leg is more swollen. I am not seeing any obvious signs of infection at this time although that is definitely something were obviously acutely aware of simply due to the fact that she had an issue not too far back with exactly this issue. Nonetheless I do feel like that lymphedema clinic would still be beneficial for her. I explained obviously if they are not able to do anything treatment wise on the right leg we could at least  have them treat her left leg and then proceed from there. The patient is really in agreement with that plan. If they are able to do both as the drainage slows down that I would be happy to let them handle both. 06/01/2019 on evaluation today patient unfortunately appears to be doing worse with regard to her right lower extremity. The left lower extremity is still maintaining at this point. Unfortunately she has been having significantly increased pain over the past several days and has been experiencing as well increased swelling of the right lower extremity. I really do not know that I am seeing anything that appears to be obvious for infection at this point to be peripherally honest. With that being said the patient does seem to be having much more swelling that she is even experienced in the past and coupled with increased pain in her hip as well I am concerned that again she could potentially have a DVT although I am not 100% sure of this. I think it something that may need to be checked out. We discussed the possibility of sending her for a DVT study through the hospital but unfortunately transportation is an issue if she does have a DVT I do not want her to wait days to be able to get in for that test however if she has this scheduled as an  outpatient that is as fast that she will be able to get the test scheduled for transportation purposes. That will also fall on Thanksgiving so subsequently she did actually be looking at either Friday or even next week before we would know anything back from this. That is much too long in my opinion. Subsequent to the amount of discomfort she is experiencing the patient is actually okay with going to the ER for evaluation today. 06/12/2019 on evaluation today patient actually appears to be doing significantly better compared to last time I saw her. Following when I last saw her she was actually in the hospital from that Monday until the following Sunday almost 1 full week. She actually was placed on Keflex in the hospital following the time for her to be discharged and Dr. Steva Ready has recommended 2 times a day dosing of the Keflex for the next year in order to help with more prophylactic/preventative measures with regard to her developing cellulitis. Overall I think this sounds like an excellent plan. The patient unfortunately is good to have trouble being treated at lymphedema clinic due to the fact that she really cannot get up on the bed that they have there. They also state that they cannot manage her as long as she has anything draining at this point. Obviously that is somewhat unfortunate as she does need help with edema control but nonetheless we will have to do what we can for her outside of it sounds like the lymphedema clinic scenario at this point. 06/19/2019 on evaluation today patient appears to be doing fairly well with regard to her bilateral lower extremities. She is not nearly as swollen and shows no signs of infection at this point. There is no evidence of cellulitis whatsoever. She also has no open wounds or draining at this point which is also good news. No fever chills noted. She seems to be in very good spirits and in fact appears to be doing quite  well. READMISSION 11/27/2019 KATTI, PELLE ([id number]) This is a 66 year old woman that we have had in this clinic several times before including 2015, 16 and 19 and then  most recently from 03/20/2019 through 06/19/2019 with bilateral lower extremity lymphedema. She has had previous arterial and reflux studies done years ago which were not all that remarkable. 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 She does have a positive family history and she had large legs starting may be in her 71s. She was recently in hospital from 10/20/2019 through 10/28/2019 with right leg cellulitis. She was given Ancef and clindamycin and then Zosyn when a culture showed Pseudomonas. At that time there was purulent drainage. She was followed by infectious disease Dr Steva Ready. The patient is now back at home. She has noted increased swelling in the right and no drainage in her right leg mostly on the posterior medial aspect in the calf area. She has not had pain or fever. She has literally been improved lysing above dressings because her at the area of this is far too large for standard compression. She has been wrapping the areas with sheets to resorptive pads. She is found these helped somewhat. She does have an appointment with the lymphedema clinic in [place] in late June. Past medical history includes bilateral lymphedema, hypertension, obstructive sleep apnea with CPAP. Recent hospitalization with apparently Pseudomonas cellulitis of the right lower leg 12/15/2019 upon evaluation today patient appears to be doing a little bit worse in regard to her right lower extremity. Unfortunately she is having more weeping down in the lower portion of her leg. Fortunately there is no signs of active infection at this time. No fever chills noted. The patient states she is not having increased pain except for when she attempted to use the lymphedema pumps unfortunately  she states that she did have pain when she did this. Otherwise we been using absorptive dressings of one type or another she is using diapers at home and then subsequently Ace wraps. In regard to the barrier cream we have discussed the possibility of derma cloud which she would like to try I do not have a problem with that. 12/22/2019 upon evaluation today patient actually appears to be doing better in regard to her leg ulcers at this point. Fortunately there does not appear to be any signs of active infection which is great news and I am extremely pleased with where things are progressing at this time. There is no sign of active infection currently. The patient is very pleased to see things doing so well. 12/29/2019 upon evaluation today patient appears to be doing a little bit better in regard to her weeping in general over her lower extremities. She does have some signs of mild erythema little bit more than what I noted last week or rather last visit. Nonetheless I think that my threshold for switching her antibiotics from Keflex to something else is very low at this point considering that she has had such severe infections in the past that seem to come almost out of nowhere. There is a little erythema and warmth noted of the lower portion of her leg compared to the upper which also makes me want to go ahead and address things more rapidly at this point. Likely I would switch out the Keflex for something like Levaquin ideally. 7/16; patient with severe bilateral lymphedema. She has superficial wounds albeit almost circumferential now on the left lateral lower leg. This may be new from last time. Small area on the right anterior lower leg and then another area on the right medial lower leg and of pannus fold. She  has been using various absorptive garments. She states she is using her compression pumps once a day occasionally twice. Culture from her last visit here was negative 01/29/2020 on  evaluation today patient appears to be doing excellent at this point in regard to her legs with regard to infection I see no signs of active infection at this point. She still does have unfortunately areas of weeping this is minimal on the right now her left is actually significantly worse although I do not think it is as bad as last week with Dr. Dellia Nims saw her. She has been trying to pump and elevate her legs is much as possible. She has previously been on the Keflex and in the past for prevention that seems to do fairly well and likely can extend that today. 02/04/2020 on evaluation today patient appears to be doing better in regard to her legs bilaterally. Fortunately there is no signs of active infection at this time which is great news and overall she has less weeping on the left compared to the right and there is several spots where she is pretty much sealed up with no draining regions. Overall very pleased in this regard. 02/19/2020 on evaluation today patient appears to be doing very well in regard to her wounds currently. Fortunately there is no evidence of active infection overall very pleased with where things stand. She is significantly improved in regard to her edema I am extremely pleased in this regard she tells me that the popping no longer hurts and in fact she actually looks forward to it. 03/04/2020 on evaluation today patient appears to be doing excellent in regard to her lower extremities. Fortunately there is no signs of active infection at this time. No fevers, chills, nausea, vomiting, or diarrhea. 03/25/2020 on evaluation today patient appears to be doing a little bit more poorly in regard to her legs at this point. She tells me that she is still continue to have issues with drainage and this has been a little bit worse she was getting ready to start taking the Keflex again but wanted to see me first. Fortunately there is no signs of active infection at this time. No fevers,  chills, nausea, vomiting, or diarrhea. 04/15/2020 upon evaluation today patient appears to be doing somewhat poorly in regard to her right leg. She tells me she has been having more pain she has been taking the Keflex that was previously prescribed unfortunately that just does not seem to help with this. She was hoping that the pain on her right was actually coming from the fact that she was having issues with her wrap having gotten caught in her recliner. With that being said she tells me that she knew something was not right. Currently her right leg is warm to touch along with being erythematous all the way up to around at least mid thigh as far as I can see. The left leg does not appear to be doing that badly though there is increased weeping around the ankle region. 05/06/2020 on evaluation today patient appears to be doing much better than last time I saw her. She did go to the hospital where she was admitted for 2 days and treated with antibiotic therapy. She was discharged with antibiotics as well and has done extremely well. I am extremely pleased with where things stand today. There is no signs of active infection at this time which is great news. 05/27/2020 upon evaluation today patient appears to be doing well with regard  to her lower extremities bilaterally. She has just a very tiny area on the right leg which is opening on the left leg she is significantly improved though she still has several areas that do appear to be open this is minimal compared to what is been in the past. In general I am extremely pleased with where things stand today. The patient does tell me she is not been using her pumps quite as much as she should be. I do believe that is 1 area she can definitely work on. She has had a lot going on including a Covid exposure and apparently also a outbreak of likely shingles. 06/24/2020 upon evaluation today patient appears to be doing well with regard to her legs in general  although the left leg unfortunately is showing some signs of erythema she does have a little bit of increased weeping and to be honest I am concerned about infection here. I discussed that with her today and I think that we may need to address this sooner rather than later she has been taking Keflex she is not really certain that is been making a big improvement however. No fevers, chills, nausea, vomiting, or diarrhea. GENIA, PERIN ([id number]) 06/30/2020 upon evaluation today patient's legs actually seem to be doing better in my opinion as compared to where they were last week. Fortunately there does not appear to be any signs of active infection. Her culture showed multiple organisms nothing predominate. With that being said the Levaquin seems to have done well I think she has improved since I last saw her as well. 07/14/2020 upon evaluation today patient appears to be doing actually better in regard to her lower extremities in my opinion. She has been tolerating the dressing changes without complication. Fortunately there is no signs of active infection at this time. 08/04/2020 upon evaluation today patient appears to be doing excellent in regard to her leg ulcers. Fortunately she has very little that is open at this point. This is great news. In fact I think that the Goldbond medicated powder has been excellent for her. It seems to have done the trick where we had tried several other things without as much success. Fortunately there is no evidence of active infection at this time. No fevers, chills, nausea, vomiting, or diarrhea. 09/01/2020 upon evaluation today patient appears to be doing a little bit more poorly than the last time I saw her. She tells me right now that she has been having a lot of drainage compared to where things were previous which has unfortunately led to more irritation as well. She is concerned that this is leading to infection. Again based on what I am seeing today as  well I am also concerned of the same to be honest. 09/15/2020 upon evaluation today patient appears to be doing well with regard to her legs compared to what I saw previous. Fortunately there does not appear to be any evidence of active infection at this time which is great news. At least not as badly as what it was previous. With that being said I do believe that the patient does need to have an extension of the antibiotics. This I believe will actually help her more in the way of making sure this stays under control and does not worsen. That was the Bactrim DS. Readmission: 08/01/2021 upon evaluation today patient appears to be doing actually pretty well all things considered. Is actually been almost a year since have seen her last March. Fortunately I  do not see any evidence of active infection locally nor systemically at this point. She does have a couple open areas on the left leg the right leg appears to be doing quite well. In general she has been in the hospital 3 times since I saw her a year ago all 4 cellulitis type issues except for the last 1 which was actually more related to congestive heart failure for that reason she is not using her lymphedema pumps at this point and that is probably the best idea. 08/08/2021 upon evaluation today patient appears to be doing well with regard to her legs all things considered her left leg may be a little bit more swollen based on what I see today. Fortunately I do not see any signs of active infection locally nor systemically at this time which is great news. No fevers, chills, nausea, vomiting, or diarrhea. 08/15/2021 upon evaluation today patient actually appears to be doing excellent in regard to her wounds. She has been tolerating the dressing changes without complication. Fortunately I see no evidence of infection currently which is great news. No fevers, chills, nausea, vomiting, or diarrhea. 08/29/2021 upon evaluation patient appears to be doing  excellent she in fact is almost completely healed. I am actually very pleased with where we stand today and I think that she is making wonderful progress she just has a small area of weeping on the right lateral leg everything else is pretty much completely healed which is also. 09/05/2021 upon evaluation today patient appears to be doing well with regard to her legs. She does have a small area of weeping on the right leg and there is a small area on the left leg as well. It is potentially something that may need to be addressed in the future more specifically but right now I think that there may have just been a little drainage here on the left. With all that being said I think that this still does not appear to be infected and overall I feel like she is doing quite well. That something we always have to keep a very close eye on as she can change very rapidly from okay to not okay. 09/12/2021 upon evaluation today patient appears to be doing a little bit worse in regard to her leg and actually somewhat concerned about the possibility of infection here. I discussed that with the patient. For that reason I am going to go ahead and see about getting her set up for a repeat prescription for the Bactrim. She is in agreement with that plan. Electronic Signature(s) Signed: 09/12/2021 5:00:37 PM By: Worthy Keeler PA-C Entered By: Worthy Keeler on 09/12/2021 17:00:37 Lull, [name] ([id number]) -------------------------------------------------------------------------------- Physical Exam Details Patient Name: ROCHELLA, BENNER C. Date of Service: 09/12/2021 3:00 PM Medical Record Number: [id number] Patient Account Number: [id number] Date of Birth/Sex: 02-22-1956 (66 y.o. F) Treating RN: [name] Dredge Primary Care Provider: Ranae Plumber Other Clinician: Referring Provider: Ranae Plumber Treating Provider/Extender: Skipper Cliche in Treatment: 6 Constitutional Obese and well-hydrated in no acute  distress. Respiratory normal breathing without difficulty. Psychiatric this patient is able to make decisions and demonstrates good insight into disease process. Alert and Oriented x 3. pleasant and cooperative. Notes Upon evaluation patient has several areas of blistering that seems to be a little bit more problematic at this point unfortunately. I do think that she would benefit from reinitiation of the Bactrim. Electronic Signature(s) Signed: 09/12/2021 5:01:39 PM By: Worthy Keeler PA-C Entered  By: Worthy Keeler on 09/12/2021 17:01:39 Snedeker, [name] ([id number]) -------------------------------------------------------------------------------- Physician Orders Details Patient Name: [name]. Date of Service: 09/12/2021 3:00 PM Medical Record Number: [id number] Patient Account Number: [id number] Date of Birth/Sex: [birth year] (66 y.o. 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 o Nurse Visit as needed Bathing/ Shower/ Hygiene o May shower; gently cleanse wound with antibacterial soap, rinse and pat dry prior to dressing wounds Wound Treatment Wound #9 - Lower Leg Wound Laterality: Right Primary Dressing: Silvercel 4 1/4x 4 1/4 (in/in) (DME) (Generic) 1 x Per Day/30 Days Discharge Instructions: Apply Silvercel 4 1/4x 4 1/4 (in/in) as instructed Secondary Dressing: ABD Pad 5x9 (in/in) (DME) (Generic) 1 x Per Day/30 Days Discharge Instructions: Cover with ABD pad Secondary Dressing:  Kerlix 4.5 x 4.1 (in/yd) (DME) (Generic) 1 x Per Day/30 Days Discharge Instructions: Apply Kerlix 4.5 x 4.1 (in/yd) as instructed Secured With: ACE WRAP - 49M ACE Elastic Bandage With VELCRO Brand Closure, 4 (in) (DME) (Generic) 1 x Per Day/30 Days Secured With: Medipore Tape - 49M Medipore H Soft Cloth Surgical Tape, 2x2 (in/yd) (DME) (Generic) 1 x Per Day/30 Days Patient Medications Allergies: ibuprofen, ACE Inhibitors, vancomycin Notifications Medication Indication Start End Bactrim DS 09/12/2021 DOSE 1 - oral 800 mg-160 mg tablet - 1 tablet oral taken 2 times per day for 14 days Electronic Signature(s) Signed: 09/12/2021 5:02:44 PM By: [name] Dredge Signed: 09/12/2021 7:37:05 PM By: Worthy Keeler PA-C Previous Signature: 09/12/2021 4:57:21 PM Version By: Worthy Keeler PA-C Entered By: [name] Dredge on 09/12/2021 17:02:43 Hoots, [name] ([id number]) -------------------------------------------------------------------------------- Problem List Details Patient Name: [name] Date of Service: 09/12/2021 3:00 PM Medical Record Number: [id number] Patient Account Number: [id number] Date of Birth/Sex: 29-Dec-1955 (66 y.o. 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 Date of Service: 09/12/2021 3:00 PM Medical Record Number: [id number] Patient Account Number: [id number] Date of Birth/Sex: 1955/08/14 (66 y.o. F) Treating RN: [name] Dredge Primary Care Provider: Ranae Plumber Other Clinician: Referring Provider: Ranae Plumber Treating Provider/Extender: Skipper Cliche in Treatment: 6 Subjective Chief Complaint Information obtained from Patient Bilateral LE lymphedema with Left LE Ulcers History of Present Illness (HPI) The patient is a 66 year old female with history of hypertension and a long-standing history of bilateral lower extremity lymphedema (first presented on 4/2) . She has had open ulcers in the past which have always responded to compression therapy. She had briefly been to a lymphedema  clinic in the past which helped her at the time. this time around she stopped treatment of her lymphedema pumps approximately 2 weeks ago because of some pain in the knees and then noticed the right leg getting worse. She was seen by her PCP who put her on clindamycin 4 times a day 2 days ago. The patient has seen [facility] and Dr. Delana Meyer had seen her last year where a vascular study including venous and arterial duplex studies were within normal limits. he had recommended compression stockings and lymphedema pumps and the patient has been using this in about 2 weeks ago. She is known to be diabetic but in the past few time she's gone to her primary care doctor her hemoglobin A1c has been normal. 02/11/2015 - after her last visit she took my advice and went to the ER regarding the progressive cellulitis of her right lower extremity and she was admitted between July 17 and 22nd. She received IV antibiotics and then was sent home on a course of steroid-induced and oral antibiotics. She has improved much since  then. 02/17/2015 -- she has been doing fine and the weeping of her legs has remarkably gone down. She has no fresh issues. READMISSION 01/15/18 This patient was given this clinic before most recently in 2016 seen by Dr. Con Memos. She has massive bilateral lymphedema and over the last 2 months this had weeping edema out of the left leg. She has compression pumps but her compliance with these has been minimal. She has [facility] they've been using TCA/ABDs/kerlix under an Ace wrap.she has had recent problems with cellulitis. She was apparently seen in the ER and 12/23/17 and given clindamycin. She was then followed by her primary doctor and given doxycycline and Keflex. The pain seems to have settled down. In April 2018 the patient had arterial studies done at [facility]. This showed triphasic waveforms throughout the right leg and mostly triphasic waveforms on the left except for monophasic at the posterior tibial artery distally. She was not felt to have evidence of right lower extremity arterial stenosis or significant problems on the left side. 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 She also had a right lower extremity venous Doppler in January 2018 this was limited by the patient's body habitus and lymphedema. Most of the proximal veins were not visualized The patient presents with an area of denuded skin on the anterior medial part of the left calf. There is weeping edema fluid here. 01/22/18; the patient has somewhat better edema control using her compression pumps twice a day and as a result she has much better epithelialization on the left anterior calf area. Only a small open area remains. 01/29/18; the patient has been compliant with her compression pumps. Both the areas on her calf that healed. The remaining area on the left anterior leg is fully epithelialized Readmission: 02/20/2019 upon evaluation today patient presents for reevaluation  due to issues that she is having with the bilateral lower extremities. She actually has wounds open on both legs. On the right she has an area in the crease of her leg on the right around the knee region which is actually draining quite a bit and actually has some fungal type appearance to it. She has been on nystatin powder that seems to have helped to some degree. In regard to the left lower extremity this is actually in the lower portion of her leg closer to the ankle and again is continuing  to drain as well unfortunately. There does not appear to be any signs of active infection at this time which is good news. No fevers, chills, nausea, vomiting, or diarrhea. She tells me that since she was seen last year she is actually been doing quite well for the most part with regard to her lower extremities. Unfortunately she now is experiencing a little bit more drainage at this time. She is concerned about getting this under control so that it does not get significantly worse. 02/27/2019 on evaluation today patient appears to be doing somewhat better in regard to her bilateral lower extremity wounds. She has been tolerating the dressing changes without complication. Fortunately there is no signs of active infection at this point. No fevers, chills, nausea, vomiting, or diarrhea. She did get her dressing supplies which is excellent news she was extremely excited to get these. She also got paperwork from [employer] for their financial assistance program where they may be able to help her out in the future if needed with supplies at discounted prices. 03/06/2019 on evaluation today patient appears to be doing a little worse with regard to both areas of weeping on her bilateral lower extremities. This is around the right medial knee and just above the left ankle. With that being said she is unfortunately not doing as well as I would like to see. I feel like she may need to potentially go see someone at the  lymphedema clinic as the wraps that she needs or even beyond what we can do here at the wound care center. She really does not have wounds she just has open areas of weeping that are causing some difficulty for her. Subsequently because of this and the moisture I am concerned about the potential for infection I am going to likely give her a prophylactic antibiotic today, Keflex, just to be on the safe side. Nonetheless again there is no obvious signs of active infection at this time. EMELINE, SIMPSON ([id number]) 03/13/2019 on evaluation today patient appears to be doing well with regard to her bilateral lower extremities where she has been weeping compared to even last week's evaluation. I see some areas of new skin growth which is excellent and overall I am very pleased with how things seem to be progressing. No fevers, chills, nausea, vomiting, or diarrhea. 03/20/2019 on evaluation today patient unfortunately is continuing to have issues with significant edema of the left lower extremity. Her right side seems to be doing much better. Unfortunately her left side is showing increased weeping of the lower portion of her leg. This is quite unfortunate obviously we were hoping to get her into the lymphedema clinic they really do not seem to when I see her how if she is draining. Despite the fact this is really not wound related but more lymphedema weeping related. Nonetheless I do not know that this can be helpful for her to even go for that appointment since again I am not sure there is much that they would actually do at this point. We may need to try a 4 layer compression wrap as best we can on her leg. She is on the Augmentin currently although I am still concerned about whether or not there could be potentially something going on infection wise I would obtain a culture though I understand is not the best being that is a surface culture I just 1 to make sure I do not seem to be missing  anything. 03/27/2019 on evaluation today patient appears  to be doing much better in regard to the left lower extremity compared to last week. Last week she had tremendous weeping which I think was subsequent to infection now she seems to be doing much better and very pleased. This is not completely healed but there is a lot of new skin growth and it has dried out quite a bit. Overall I think that we are doing well with how things are moving along at this time. No fevers, chills, nausea, vomiting, or diarrhea. 04/03/2019 evaluation today patient appears to be doing a little worse this week compared to last time I saw her. I think this may be due to the fact that she is having issues with not being able to sleep in her bed at least not until last night. She is therefore been in a lift chair and subsequently has also had issues with not been able to use her pumps since she could not get in bed. With that being said the patient overall seems to be doing okay I do think I may want extend the antibiotic for a little bit longer at least until we can see if her edema and her weeping gets better and if it is then obviously I can always discontinue the antibiotics as of next week however I want her to continue to have it over the next week. 04/10/2019 on evaluation today patient unfortunately is still doing poorly with regard to her left lower extremity. Her right is all things considering doing fairly well. On the left however she continues to have spreading of the area of infection and weeping which appears to be even a larger surface area than noted last week. She did have a positive culture for Pseudomonas in particular which seems to have been of concern she still has green/yellow discharge consistent with Pseudomonas and subsequently a tremendous amount of it. This has me obviously still concerned about the infection not really clearing up despite the fact that on culture it appears the Cipro should have  been a good option for treating this. I think she may at this point need IV antibiotics since things are not doing better I do not want to get worse and cause sepsis. She is in agreement with the plan and believes as well that she likely does need to go to the hospital for IV vancomycin. Or something of the like depending on what the recommendation is from the ER. 04/17/2019 on evaluation today patient appears to be doing excellent in regard to her lower extremity on the left. She was in the hospital for several days from when I sent her last we saw her until just this past Tuesday. Fortunately her drainage is significantly improved and in fact is mostly clear. There is just a couple small areas that may still drain a little bit she states that the [facility] they prescribed for her at discharge she went picked up from pharmacy and got home but has not been able to find it since. She is looked everywhere. She is wondering if I will replace that for her today I will be more than happy to do that. 05/01/2019 on evaluation today patient actually appears to be doing quite well with regard to her lower extremities. She occasionally is having areas that will leak and then heal up mainly when a piece of the fibrotic skin pops off but fortunately she is not having any signs of active infection at this time. Overall she also really does not have any obvious  weeping at this time. I do believe however she really needs some compression wraps and I think this may be a good time to get her back to the lymphedema clinic. 05/11/2019 on evaluation today patient actually appears to be doing quite well with regard to her bilateral lower extremities. She occasionally will have a small area that we per another but in general seems to be completely healed which is great news. Overall very pleased with how everything seems to be progressing. She does have her appointment with lymphedema clinic on November 18. 05/25/2019 on  evaluation today patient appears to be doing well with regard to her left lower extremity. I am very pleased in this regard. In regard to her right leg this actually did start draining more I think it is mainly due to the fact that her leg is more swollen. I am not seeing any obvious signs of infection at this time although that is definitely something were obviously acutely aware of simply due to the fact that she had an issue not too far back with exactly this issue. Nonetheless I do feel like that lymphedema clinic would still be beneficial for her. I explained obviously if they are not able to do anything treatment wise on the right leg we could at least have them treat her left leg and then proceed from there. The patient is really in agreement with that plan. If they are able to do both as the drainage slows down that I would be happy to let them handle both. 06/01/2019 on evaluation today patient unfortunately appears to be doing worse with regard to her right lower extremity. The left lower extremity is still maintaining at this point. Unfortunately she has been having significantly increased pain over the past several days and has been experiencing as well increased swelling of the right lower extremity. I really do not know that I am seeing anything that appears to be obvious for infection at this point to be peripherally honest. With that being said the patient does seem to be having much more swelling that she is even experienced in the past and coupled with increased pain in her hip as well I am concerned that again she could potentially have a DVT although I am not 100% sure of this. I think it something that may need to be checked out. We discussed the possibility of sending her for a DVT study through the hospital but unfortunately transportation is an issue if she does have a DVT I do not want her to wait days to be able to get in for that test however if she has this scheduled as an  outpatient that is as fast that she will be able to get the test scheduled for transportation purposes. That will also fall on Thanksgiving so subsequently she did actually be looking at either Friday or even next week before we would know anything back from this. That is much too long in my opinion. Subsequent to the amount of discomfort she is experiencing the patient is actually okay with going to the ER for evaluation today. 06/12/2019 on evaluation today patient actually appears to be doing significantly better compared to last time I saw her. Following when I last saw her she was actually in the hospital from that Monday until the following Sunday almost 1 full week. She actually was placed on Keflex in the hospital following the time for her to be discharged and Dr. Steva Ready has recommended 2 times a day dosing  of the Keflex for the next year in order to help with more prophylactic/preventative measures with regard to her developing cellulitis. Overall I think this sounds like an excellent plan. The patient unfortunately is good to have trouble being treated at lymphedema clinic due to the fact that she really cannot get up on the bed that they have there. They also state that they cannot manage her as long as she has anything draining at this point. Obviously that is somewhat unfortunate as she does need help with edema control but nonetheless we will have to do what we can for her outside of it sounds like the lymphedema clinic scenario at this point. 06/19/2019 on evaluation today patient appears to be doing fairly well with regard to her bilateral lower extremities. She is not nearly as swollen Mauro, Juanna C. ([id number]) and shows no signs of infection at this point. There is no evidence of cellulitis whatsoever. She also has no open wounds or draining at this point which is also good news. No fever chills noted. She seems to be in very good spirits and in fact appears to be doing quite  well. READMISSION 11/27/2019 This is a 66 year old woman that we have had in this clinic several times before including 2015, 16 and 19 and then most recently from 03/20/2019 through 06/19/2019 with bilateral lower extremity lymphedema. She has had previous arterial and reflux studies done years ago which were not all that remarkable. 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 She does have a positive family history and she had large legs starting may be in her 68s. She was recently in hospital from 10/20/2019 through 10/28/2019 with right leg cellulitis. She was given Ancef and clindamycin and then Zosyn when a culture showed Pseudomonas. At that time there was purulent drainage. She was followed by infectious disease Dr Steva Ready. The patient is now back at home. She has noted increased swelling in the right and no drainage in her right leg mostly on the posterior medial aspect in the calf area. She has not had pain or fever. She has literally been improved lysing above dressings because her at the area of this is far too large for standard compression. She has been wrapping the areas with sheets to resorptive pads. She is found these helped somewhat. She does have an appointment with the lymphedema clinic in [place] in late June. Past medical history includes bilateral lymphedema, hypertension, obstructive sleep apnea with CPAP. Recent hospitalization with apparently Pseudomonas cellulitis of the right lower leg 12/15/2019 upon evaluation today patient appears to be doing a little bit worse in regard to her right lower extremity. Unfortunately she is having more weeping down in the lower portion of her leg. Fortunately there is no signs of active infection at this time. No fever chills noted. The patient states she is not having increased pain except for when she attempted to use the lymphedema pumps unfortunately she states that she did  have pain when she did this. Otherwise we been using absorptive dressings of one type or another she is using diapers at home and then subsequently Ace wraps. In regard to the barrier cream we have discussed the possibility of derma cloud which she would like to try I do not have a problem with that. 12/22/2019 upon evaluation today patient actually appears to be doing better in regard to her leg ulcers at this point. Fortunately there does not appear to be any signs  of active infection which is great news and I am extremely pleased with where things are progressing at this time. There is no sign of active infection currently. The patient is very pleased to see things doing so well. 12/29/2019 upon evaluation today patient appears to be doing a little bit better in regard to her weeping in general over her lower extremities. She does have some signs of mild erythema little bit more than what I noted last week or rather last visit. Nonetheless I think that my threshold for switching her antibiotics from Keflex to something else is very low at this point considering that she has had such severe infections in the past that seem to come almost out of nowhere. There is a little erythema and warmth noted of the lower portion of her leg compared to the upper which also makes me want to go ahead and address things more rapidly at this point. Likely I would switch out the Keflex for something like Levaquin ideally. 7/16; patient with severe bilateral lymphedema. She has superficial wounds albeit almost circumferential now on the left lateral lower leg. This may be new from last time. Small area on the right anterior lower leg and then another area on the right medial lower leg and of pannus fold. She has been using various absorptive garments. She states she is using her compression pumps once a day occasionally twice. Culture from her last visit here was negative 01/29/2020 on evaluation today patient appears  to be doing excellent at this point in regard to her legs with regard to infection I see no signs of active infection at this point. She still does have unfortunately areas of weeping this is minimal on the right now her left is actually significantly worse although I do not think it is as bad as last week with Dr. Dellia Nims saw her. She has been trying to pump and elevate her legs is much as possible. She has previously been on the Keflex and in the past for prevention that seems to do fairly well and likely can extend that today. 02/04/2020 on evaluation today patient appears to be doing better in regard to her legs bilaterally. Fortunately there is no signs of active infection at this time which is great news and overall she has less weeping on the left compared to the right and there is several spots where she is pretty much sealed up with no draining regions. Overall very pleased in this regard. 02/19/2020 on evaluation today patient appears to be doing very well in regard to her wounds currently. Fortunately there is no evidence of active infection overall very pleased with where things stand. She is significantly improved in regard to her edema I am extremely pleased in this regard she tells me that the popping no longer hurts and in fact she actually looks forward to it. 03/04/2020 on evaluation today patient appears to be doing excellent in regard to her lower extremities. Fortunately there is no signs of active infection at this time. No fevers, chills, nausea, vomiting, or diarrhea. 03/25/2020 on evaluation today patient appears to be doing a little bit more poorly in regard to her legs at this point. She tells me that she is still continue to have issues with drainage and this has been a little bit worse she was getting ready to start taking the Keflex again but wanted to see me first. Fortunately there is no signs of active infection at this time. No fevers, chills, nausea, vomiting, or  diarrhea. 04/15/2020 upon evaluation today patient appears to be doing somewhat poorly in regard to her right leg. She tells me she has been having more pain she has been taking the Keflex that was previously prescribed unfortunately that just does not seem to help with this. She was hoping that the pain on her right was actually coming from the fact that she was having issues with her wrap having gotten caught in her recliner. With that being said she tells me that she knew something was not right. Currently her right leg is warm to touch along with being erythematous all the way up to around at least mid thigh as far as I can see. The left leg does not appear to be doing that badly though there is increased weeping around the ankle region. 05/06/2020 on evaluation today patient appears to be doing much better than last time I saw her. She did go to the hospital where she was admitted for 2 days and treated with antibiotic therapy. She was discharged with antibiotics as well and has done extremely well. I am extremely pleased with where things stand today. There is no signs of active infection at this time which is great news. 05/27/2020 upon evaluation today patient appears to be doing well with regard to her lower extremities bilaterally. She has just a very tiny area on the right leg which is opening on the left leg she is significantly improved though she still has several areas that do appear to be open this is minimal compared to what is been in the past. In general I am extremely pleased with where things stand today. The patient does tell me she is not been using her pumps quite as much as she should be. I do believe that is 1 area she can definitely work on. She has had a lot going on including a Covid exposure and apparently also a outbreak of likely shingles. TOBY, AYAD ([id number]) 06/24/2020 upon evaluation today patient appears to be doing well with regard to her legs in general  although the left leg unfortunately is showing some signs of erythema she does have a little bit of increased weeping and to be honest I am concerned about infection here. I discussed that with her today and I think that we may need to address this sooner rather than later she has been taking Keflex she is not really certain that is been making a big improvement however. No fevers, chills, nausea, vomiting, or diarrhea. 06/30/2020 upon evaluation today patient's legs actually seem to be doing better in my opinion as compared to where they were last week. Fortunately there does not appear to be any signs of active infection. Her culture showed multiple organisms nothing predominate. With that being said the Levaquin seems to have done well I think she has improved since I last saw her as well. 07/14/2020 upon evaluation today patient appears to be doing actually better in regard to her lower extremities in my opinion. She has been tolerating the dressing changes without complication. Fortunately there is no signs of active infection at this time. 08/04/2020 upon evaluation today patient appears to be doing excellent in regard to her leg ulcers. Fortunately she has very little that is open at this point. This is great news. In fact I think that the Gold[employer] medicated powder has been excellent for her. It seems to have done the trick where we had tried several other things without as much success. Fortunately there is no  evidence of active infection at this time. No fevers, chills, nausea, vomiting, or diarrhea. 09/01/2020 upon evaluation today patient appears to be doing a little bit more poorly than the last time I saw her. She tells me right now that she has been having a lot of drainage compared to where things were previous which has unfortunately led to more irritation as well. She is concerned that this is leading to infection. Again based on what I am seeing today as well I am also concerned of the  same to be honest. 09/15/2020 upon evaluation today patient appears to be doing well with regard to her legs compared to what I saw previous. Fortunately there does not appear to be any evidence of active infection at this time which is great news. At least not as badly as what it was previous. With that being said I do believe that the patient does need to have an extension of the antibiotics. This I believe will actually help her more in the way of making sure this stays under control and does not worsen. That was the Bactrim DS. Readmission: 08/01/2021 upon evaluation today patient appears to be doing actually pretty well all things considered. Is actually been almost a year since have seen her last March. Fortunately I do not see any evidence of active infection locally nor systemically at this point. She does have a couple open areas on the left leg the right leg appears to be doing quite well. In general she has been in the hospital 3 times since I saw her a year ago all 4 cellulitis type issues except for the last 1 which was actually more related to congestive heart failure for that reason she is not using her lymphedema pumps at this point and that is probably the best idea. 08/08/2021 upon evaluation today patient appears to be doing well with regard to her legs all things considered her left leg may be a little bit more swollen based on what I see today. Fortunately I do not see any signs of active infection locally nor systemically at this time which is great news. No fevers, chills, nausea, vomiting, or diarrhea. 08/15/2021 upon evaluation today patient actually appears to be doing excellent in regard to her wounds. She has been tolerating the dressing changes without complication. Fortunately I see no evidence of infection currently which is great news. No fevers, chills, nausea, vomiting, or diarrhea. 08/29/2021 upon evaluation patient appears to be doing excellent she in fact is almost  completely healed. I am actually very pleased with where we stand today and I think that she is making wonderful progress she just has a small area of weeping on the right lateral leg everything else is pretty much completely healed which is also. 09/05/2021 upon evaluation today patient appears to be doing well with regard to her legs. She does have a small area of weeping on the right leg and there is a small area on the left leg as well. It is potentially something that may need to be addressed in the future more specifically but right now I think that there may have just been a little drainage here on the left. With all that being said I think that this still does not appear to be infected and overall I feel like she is doing quite well. That something we always have to keep a very close eye on as she can change very rapidly from okay to not okay. 09/12/2021 upon evaluation today patient  appears to be doing a little bit worse in regard to her leg and actually somewhat concerned about the possibility of infection here. I discussed that with the patient. For that reason I am going to go ahead and see about getting her set up for a repeat prescription for the Bactrim. She is in agreement with that plan. Objective Constitutional Obese and well-hydrated in no acute distress. Vitals Time Taken: 3:23 PM, Height: 63 in, Weight: 372 lbs, BMI: 65.9, Temperature: 98 F, Pulse: 73 bpm, Respiratory Rate: 18 breaths/min, Blood Pressure: 152/88 mmHg. Respiratory normal breathing without difficulty. Psychiatric this patient is able to make decisions and demonstrates good insight into disease process. Alert and Oriented x 3. pleasant and cooperative. CARLISLE, TORGESON ([id number]) General Notes: Upon evaluation patient has several areas of blistering that seems to be a little bit more problematic at this point unfortunately. I do think that she would benefit from reinitiation of the Bactrim. Integumentary  (Hair, Skin) Wound #9 status is Open. Original cause of wound was Blister. The date acquired was: 07/31/2021. The wound has been in treatment 5 weeks. The wound is located on the Right Lower Leg. The wound measures 1cm length x 1.8cm width x 0.1cm depth; 1.414cm^2 area and 0.141cm^3 volume. There is Fat Layer (Subcutaneous Tissue) exposed. There is no tunneling or undermining noted. There is a medium amount of serosanguineous drainage noted. There is large (67-100%) pink granulation within the wound bed. There is no necrotic tissue within the wound bed. Other Condition(s) Patient presents with Lymphedema located on the Left Leg. 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 Nurse Visit as needed Bathing/ Shower/ Hygiene: May shower; gently cleanse wound with antibacterial soap, rinse and pat dry prior to dressing wounds The following medication(s) was prescribed: Bactrim DS oral 800 mg-160 mg tablet 1 1 tablet oral taken 2 times per day for 14 days starting 09/12/2021 WOUND #9: - Lower Leg Wound Laterality: Right Primary Dressing: Silvercel 4 1/4x 4 1/4 (in/in) (Generic) 1 x Per Day/30 Days Discharge Instructions: Apply Silvercel 4 1/4x 4 1/4 (in/in) as instructed Secondary Dressing: ABD Pad 5x9 (in/in) (Generic) 1 x Per Day/30 Days Discharge Instructions: Cover with ABD pad Secondary Dressing: Kerlix 4.5 x 4.1 (in/yd) (Generic) 1 x Per Day/30 Days Discharge Instructions: Apply Kerlix 4.5 x 4.1 (in/yd) as instructed Secured With: ACE WRAP - 62M ACE Elastic Bandage With VELCRO Brand Closure, 4 (in) (Generic) 1 x Per Day/30 Days Secured With: Medipore Tape - 62M Medipore H Soft Cloth Surgical Tape, 2x2 (in/yd) 1 x Per Day/30 Days 1. I am going to  recommend that we go ahead and initiate treatment with a prescription for Bactrim DS we will going to do this for 14 days and we will see how things look at that point when I see her back. 2. I am also can recommend that we have the patient continue with the silver alginate to the open wounds and then an Ace wrap to secure in place. 3. I would also suggest the patient continue to monitor for any signs of worsening or infection if anything changes she should let me know as soon as possible. We will see patient back for reevaluation in 1 week here in the clinic. If anything worsens or changes patient will contact our  office for additional recommendations. Electronic Signature(s) Signed: 09/12/2021 5:02:27 PM By: Worthy Keeler PA-C Entered By: Worthy Keeler on 09/12/2021 17:02:27 Cofield, [name] ([id number]) -------------------------------------------------------------------------------- SuperBill Details Patient Name: [name] Date of Service: 09/12/2021 Medical Record Number: [id number] Patient Account Number: [id number] Date of Birth/Sex: 1955/11/15 (66 y.o. F) Treating RN: [name] Dredge Primary Care Provider: Ranae Plumber Other Clinician: Referring Provider: Ranae Plumber Treating Provider/Extender: Skipper Cliche in Treatment: 6 Diagnosis Coding ICD-10 Codes Code Description Q82.0 Hereditary lymphedema L97.811 Non-pressure chronic ulcer of other part of right lower leg limited to breakdown of skin L97.821 Non-pressure chronic ulcer of other part of left lower leg limited to breakdown of skin Facility Procedures CPT4 Code: [id number] Description: [phone] - WOUND CARE VISIT-LEV 2 EST PT Modifier: Quantity: 1 Physician Procedures CPT4 Code: [id number] Description: 29937 - WC PHYS LEVEL 4 - EST PT Modifier: Quantity: 1 CPT4 Code: Description: ICD-10 Diagnosis Description Q82.0 Hereditary lymphedema L97.811 Non-pressure chronic ulcer of other part of right lower leg  limited to bre L97.821 Non-pressure chronic ulcer of other part of left lower leg limited to brea Modifier: akdown of skin kdown of skin Quantity: Electronic Signature(s) Signed: 09/12/2021 5:03:56 PM By: Worthy Keeler PA-C Previous Signature: 09/12/2021 5:03:06 PM Version By: [name] Dredge Entered By: Worthy Keeler on 09/12/2021 17:03:56

## 2021-09-19 ENCOUNTER — Other Ambulatory Visit: Payer: Self-pay

## 2021-09-19 ENCOUNTER — Encounter (HOSPITAL_BASED_OUTPATIENT_CLINIC_OR_DEPARTMENT_OTHER): Payer: Medicare HMO | Admitting: Internal Medicine

## 2021-09-19 DIAGNOSIS — L97811 Non-pressure chronic ulcer of other part of right lower leg limited to breakdown of skin: Secondary | ICD-10-CM

## 2021-09-19 DIAGNOSIS — Q820 Hereditary lymphedema: Secondary | ICD-10-CM

## 2021-09-19 DIAGNOSIS — L97821 Non-pressure chronic ulcer of other part of left lower leg limited to breakdown of skin: Secondary | ICD-10-CM | POA: Diagnosis not present

## 2021-09-19 NOTE — Progress Notes (Signed)
[name], [name] ([id number]) ?Visit Report for 09/19/2021 ?Chief Complaint Document Details ?Patient Name: [name], [name] ?Date of Service: 09/19/2021 11:15 AM ?Medical Record Number: [id number] ?Patient Account Number: [id number] ?Date of Birth/Sex: October 24, 1955 (66 y.o. F) ?Treating RN: [name] Dredge ?Primary Care Provider: Ranae Plumber Other Clinician: ?Referring Provider: Ranae Plumber ?Treating Provider/Extender: Kalman Shan ?Weeks in Treatment: 7 ?Information Obtained from: Patient ?Chief Complaint ?Bilateral LE lymphedema with Left LE Ulcers ?Electronic Signature(s) ?Signed: 09/19/2021 11:55:14 AM By: Kalman Shan DO ?Entered By: Kalman Shan on 09/19/2021 11:50:10 ?[name], [name] ([id number]) ?-------------------------------------------------------------------------------- ?HPI Details ?Patient Name: [name], [name] ?Date of Service: 09/19/2021 11:15 AM ?Medical Record Number: [id number] ?Patient Account Number: [id number] ?Date of Birth/Sex: 11-21-1955 (66 y.o. F) ?Treating RN: [name] Dredge ?Primary Care Provider: Ranae Plumber Other Clinician: ?Referring Provider: Ranae Plumber ?Treating Provider/Extender: Kalman Shan ?Weeks in Treatment: 7 ?History of Present Illness ?HPI Description: The patient is a 66 year old female with history of hypertension and a long-standing history of bilateral lower extremity ?lymphedema (first presented on 4/2) . She has had open ulcers in the past which have always responded to compression therapy. She had briefly ?been to a lymphedema clinic in the past which helped her at the time. ?this time around she stopped treatment of her lymphedema pumps approximately 2 weeks ago because of some pain in the knees and then ?noticed the right leg getting worse. She was seen by her PCP who put her on clindamycin 4 times a day 2 days ago. ?The patient has seen [facility] and Dr. Delana Meyer had seen her last year where a vascular study including venous and arterial  duplex studies were within ?normal limits. ?he had recommended compression stockings and lymphedema pumps and the patient has been using this in about 2 weeks ago. ?She is known to be diabetic but in the past few time she's gone to her primary care doctor her hemoglobin A1c has been normal. ?02/11/2015 - after her last visit she took my advice and went to the ER regarding the progressive cellulitis of her right lower extremity and she ?was admitted between July 17 and 22nd. She received IV antibiotics and then was sent home on a course of steroid-induced and oral antibiotics. ?She has improved much since then. ?02/17/2015 -- she has been doing fine and the weeping of her legs has remarkably gone down. She has no fresh issues. ?READMISSION ?01/15/18 ?This patient was given this clinic before most recently in 2016 seen by Dr. Con Memos. She has massive bilateral lymphedema and over the last 2 ?months this had weeping edema out of the left leg. She has compression pumps but her compliance with these has been minimal. She has [facility] they've been using TCA/ABDs/kerlix under an Ace wrap.she has had recent problems with cellulitis. She was apparently seen ?in the ER and 12/23/17 and given clindamycin. She was then followed by her primary doctor and given doxycycline and Keflex. The pain seems to ?have settled down. ?In April 2018 the patient had arterial studies done at [facility]. This showed triphasic waveforms throughout the right leg and ?mostly triphasic waveforms on the left except for monophasic at the posterior tibial artery distally. She was not felt to have evidence of right lower ?extremity arterial stenosis or significant problems on the left side. 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 She also had ?a right lower extremity venous Doppler in January 2018 this was limited by the patient's body habitus and  lymphedema. Most of the proximal ?veins were not  visualized ?The patient presents with an area of denuded skin on the anterior medial part of the left calf. There is weeping edema fluid here. ?01/22/18; the patient has somewhat better edema control using her compression pumps twice a day and as a result she has much better ?epithelialization on the left anterior calf area. Only a small open area remains. ?01/29/18; the patient has been compliant with her compression pumps. Both the areas on her calf that healed. The remaining area on the left ?anterior leg is fully epithelialized ?Readmission: ?02/20/2019 upon evaluation today patient presents for reevaluation due to issues that she is having with the bilateral lower extremities. She ?actually has wounds open on both legs. On the right she has an area in the crease of her leg on the right around the knee region which is actually ?draining quite a bit and actually has some fungal type appearance to it. She has been on nystatin powder that seems to have helped to some ?degree. In regard to the left lower extremity this is actually in the lower portion of her leg closer to the ankle and again is continuing to drain as ?well unfortunately. There does not appear to be any signs of active infection at this time which is good news. No fevers, chills, nausea, vomiting, ?or diarrhea. She tells me that since she was seen last year she is actually been doing quite well for the most part with regard to her lower ?extremities. Unfortunately she now is experiencing a little bit more drainage at this time. She is concerned about getting this under control so that ?it does not get significantly worse. ?02/27/2019 on evaluation today patient appears to be doing somewhat better in regard to her bilateral lower extremity wounds. She has been ?tolerating the dressing changes without complication. Fortunately there is no signs of active infection at this point. No fevers, chills, nausea, ?vomiting, or diarrhea. She did get her dressing  supplies which is excellent news she was extremely excited to get these. She also got paperwork ?from [employer] for their financial assistance program where they may be able to help her out in the future if needed with supplies at discounted ?prices. ?03/06/2019 on evaluation today patient appears to be doing a little worse with regard to both areas of weeping on her bilateral lower extremities. ?This is around the right medial knee and just above the left ankle. With that being said she is unfortunately not doing as well as I would like to ?see. I feel like she may need to potentially go see someone at the lymphedema clinic as the wraps that she needs or even beyond what we can do ?here at the wound care center. She really does not have wounds she just has open areas of weeping that are causing some difficulty for her. ?Subsequently because of this and the moisture I am concerned about the potential for infection I am going to likely give her a prophylactic ?antibiotic today, Keflex, just to be on the safe side. Nonetheless again there is no obvious signs of active infection at this time. ?03/13/2019 on evaluation today patient appears to be doing well with regard to her bilateral lower extremities where she has been weeping ?compared to even last week's evaluation. I see some areas of new skin growth which is excellent and overall I am very pleased with how things ?seem to be progressing. No fevers, chills, nausea, vomiting, or diarrhea. ?[name], [name] (  [id number]) ?03/20/2019 on evaluation today patient unfortunately is continuing to have issues with significant edema of the left lower extremity. Her right side ?seems to be doing much better. Unfortunately her left side is showing increased weeping of the lower portion of her leg. This is quite unfortunate ?obviously we were hoping to get her into the lymphedema clinic they really do not seem to when I see her how if she is draining. Despite the fact ?this is really  not wound related but more lymphedema weeping related. Nonetheless I do not know that this can be helpful for her to even go for ?that appointment since again I am not sure there is much that they would actually

## 2021-09-20 NOTE — Progress Notes (Signed)
JANIE, CAPP ([id number]) ?Visit Report for 09/19/2021 ?Arrival Information Details ?Patient Name: [name], [name] ?Date of Service: 09/19/2021 11:15 AM ?Medical Record Number: [id number] ?Patient Account Number: [id number] ?Date of Birth/Sex: 03-30-1956 (66 y.o. F) ?Treating RN: [name] Dredge ?Primary Care Nia Nathaniel: Ranae Plumber Other Clinician: ?Referring Samanthajo Payano: Ranae Plumber ?Treating Anah Billard/Extender: Kalman Shan ?Weeks in Treatment: 7 ?Visit Information History Since Last Visit ?Added or deleted any medications: No ?Patient Arrived: [name] ?Any new allergies or adverse reactions: No ?Arrival Time: 11:18 ?Had a fall or experienced change in No ?Accompanied By: self ?activities of daily living that may affect ?Transfer Assistance: None ?risk of falls: ?Patient Identification Verified: Yes ?Hospitalized since last visit: No ?Secondary Verification Process Completed: Yes ?Has Dressing in Place as Prescribed: Yes ?Pain Present Now: No ?Electronic Signature(s) ?Signed: 09/20/2021 3:41:20 PM By: [name] Dredge ?Entered By: [name] Dredge on 09/19/2021 11:18:37 ?[name], [name] ([id number]) ?-------------------------------------------------------------------------------- ?Clinic Level of Care Assessment Details ?Patient Name: [name], [name] ?Date of Service: 09/19/2021 11:15 AM ?Medical Record Number: [id number] ?Patient Account Number: [id number] ?Date of Birth/Sex: Apr 29, 1956 (66 y.o. F) ?Treating RN: [name] Dredge ?Primary Care Ruthmary Occhipinti: Ranae Plumber Other Clinician: ?Referring Abron Neddo: Ranae Plumber ?Treating Tiena Manansala/Extender: Kalman Shan ?Weeks in Treatment: 7 ?Clinic Level of Care Assessment Items ?TOOL 4 Quantity Score ?'[]'$  - Use when only an EandM is performed on FOLLOW-UP visit 0 ?ASSESSMENTS - Nursing Assessment / Reassessment ?'[]'$  - Reassessment of Co-morbidities (includes updates in patient status) 0 ?X- 1 5 ?Reassessment of Adherence to Treatment Plan ?ASSESSMENTS - Wound and  Skin Assessment / Reassessment ?X - Simple Wound Assessment / Reassessment - one wound 1 5 ?'[]'$  - 0 ?Complex Wound Assessment / Reassessment - multiple wounds ?'[]'$  - 0 ?Dermatologic / Skin Assessment (not related to wound area) ?ASSESSMENTS - Focused Assessment ?X - Circumferential Edema Measurements - multi extremities 2 5 ?'[]'$  - 0 ?Nutritional Assessment / Counseling / Intervention ?'[]'$  - 0 ?Lower Extremity Assessment (monofilament, tuning fork, pulses) ?'[]'$  - 0 ?Peripheral Arterial Disease Assessment (using hand held doppler) ?ASSESSMENTS - Ostomy and/or Continence Assessment and Care ?'[]'$  - Incontinence Assessment and Management 0 ?'[]'$  - 0 ?Ostomy Care Assessment and Management (repouching, etc.) ?PROCESS - Coordination of Care ?X - Simple Patient / Family Education for ongoing care 1 15 ?'[]'$  - 0 ?Complex (extensive) Patient / Family Education for ongoing care ?'[]'$  - 0 ?Staff obtains Consents, Records, Test Results / Process Orders ?'[]'$  - 0 ?Staff telephones HHA, Nursing Homes / Clarify orders / etc ?'[]'$  - 0 ?Routine Transfer to another Facility (non-emergent condition) ?'[]'$  - 0 ?Routine Hospital Admission (non-emergent condition) ?'[]'$  - 0 ?New Admissions / Biomedical engineer / Ordering NPWT, Apligraf, etc. ?'[]'$  - 0 ?Emergency Hospital Admission (emergent condition) ?X- 1 10 ?Simple Discharge Coordination ?'[]'$  - 0 ?Complex (extensive) Discharge Coordination ?PROCESS - Special Needs ?'[]'$  - Pediatric / Minor Patient Management 0 ?'[]'$  - 0 ?Isolation Patient Management ?'[]'$  - 0 ?Hearing / Language / Visual special needs ?'[]'$  - 0 ?Assessment of Community assistance (transportation, D/C planning, etc.) ?'[]'$  - 0 ?Additional assistance / Altered mentation ?'[]'$  - 0 ?Support Surface(s) Assessment (bed, cushion, seat, etc.) ?INTERVENTIONS - Wound Cleansing / Measurement ?[name], [name] ([id number]) ?X- 1 5 ?Simple Wound Cleansing - one wound ?'[]'$  - 0 ?Complex Wound Cleansing - multiple wounds ?X- 1 5 ?Wound Imaging (photographs - any  number of wounds) ?'[]'$  - 0 ?Wound Tracing (instead of photographs) ?X- 1 5 ?Simple Wound Measurement - one wound ?'[]'$  - 0 ?Complex  Wound Measurement - multiple wounds ?INTERVENTIONS - Wound Dressings ?X - Small Wound Dressing one or multiple wounds 1 10 ?'[]'$  - 0 ?Medium Wound Dressing one or multiple wounds ?'[]'$  - 0 ?Large Wound Dressing one or multiple wounds ?'[]'$  - 0 ?Application of Medications - topical ?'[]'$  - 0 ?Application of Medications - injection ?INTERVENTIONS - Miscellaneous ?'[]'$  - External ear exam 0 ?'[]'$  - 0 ?Specimen Collection (cultures, biopsies, blood, body fluids, etc.) ?'[]'$  - 0 ?Specimen(s) / Culture(s) sent or taken to Lab for analysis ?'[]'$  - 0 ?Patient Transfer (multiple staff / Civil Service fast streamer / Similar devices) ?'[]'$  - 0 ?Simple Staple / Suture removal (25 or less) ?'[]'$  - 0 ?Complex Staple / Suture removal (26 or more) ?'[]'$  - 0 ?Hypo / Hyperglycemic Management (close monitor of Blood Glucose) ?'[]'$  - 0 ?Ankle / Brachial Index (ABI) - do not check if billed separately ?X- 1 5 ?Vital Signs ?Has the patient been seen at the hospital within the last three years: Yes ?Total Score: 75 ?Level Of Care: New/Established - Level ?2 ?Electronic Signature(s) ?Signed: 09/20/2021 3:41:20 PM By: [name] Dredge ?Entered By: [name] Dredge on 09/19/2021 13:57:48 ?[name], [name] ([id number]) ?-------------------------------------------------------------------------------- ?Encounter Discharge Information Details ?Patient Name: [name], [name] ?Date of Service: 09/19/2021 11:15 AM ?Medical Record Number: [id number] ?Patient Account Number: [id number] ?Date of Birth/Sex: August 12, 1955 (66 y.o. F) ?Treating RN: [name] Dredge ?Primary Care Braydee Shimkus: Ranae Plumber Other Clinician: ?Referring Rajah Lamba: Ranae Plumber ?Treating Kylii Ennis/Extender: Kalman Shan ?Weeks in Treatment: 7 ?Encounter Discharge Information Items ?Discharge Condition: Stable ?Ambulatory Status: [name] ?Discharge Destination: Home ?Transportation:  Other ?Accompanied By: self ?Schedule Follow-up Appointment: Yes ?Clinical Summary of Care: ?Electronic Signature(s) ?Signed: 09/19/2021 1:58:59 PM By: [name] Dredge ?Entered By: [name] Dredge on 09/19/2021 13:58:58 ?[name], [name] ([id number]) ?-------------------------------------------------------------------------------- ?Lower Extremity Assessment Details ?Patient Name: [name], [name] ?Date of Service: 09/19/2021 11:15 AM ?Medical Record Number: [id number] ?Patient Account Number: [id number] ?Date of Birth/Sex: 08-14-1955 (66 y.o. F) ?Treating RN: [name] Dredge ?Primary Care Jaylun Fleener: Ranae Plumber Other Clinician: ?Referring Varnika Butz: Ranae Plumber ?Treating Kathlee Barnhardt/Extender: Kalman Shan ?Weeks in Treatment: 7 ?Edema Assessment ?Assessed: [Left: No] [Right: No] ?Edema: [Left: Yes] [Right: Yes] ?Calf ?Left: Right: ?Point of Measurement: 39 cm From Medial Instep 79 cm 83 cm ?Ankle ?Left: Right: ?Point of Measurement: 9 cm From Medial Instep 55.5 cm 52.4 cm ?Vascular Assessment ?Pulses: ?Dorsalis Pedis ?Palpable: [Left:Yes] [Right:Yes] ?Electronic Signature(s) ?Signed: 09/20/2021 3:41:20 PM By: [name] Dredge ?Entered By: [name] Dredge on 09/19/2021 11:35:54 ?[name], [name] ([id number]) ?-------------------------------------------------------------------------------- ?Multi Wound Chart Details ?Patient Name: [name], [name] ?Date of Service: 09/19/2021 11:15 AM ?Medical Record Number: [id number] ?Patient Account Number: [id number] ?Date of Birth/Sex: 06/23/1956 (66 y.o. F) ?Treating RN: [name] Dredge ?Primary Care Cambry Spampinato: Ranae Plumber Other Clinician: ?Referring Rizwan Kuyper: Ranae Plumber ?Treating Kwan Shellhammer/Extender: Kalman Shan ?Weeks in Treatment: 7 ?Vital Signs ?Height(in): 63 ?Pulse(bpm): 64 ?Weight(lbs): 372 ?Blood Pressure(mmHg): 145/62 ?Body Mass Index(BMI): 65.9 ?Temperature(??F): 98.3 ?Respiratory Rate(breaths/min): 18 ?Photos: [N/A:N/A] ?Wound Location: Right Lower Leg N/A  N/A ?Wounding Event: Blister N/A N/A ?Primary Etiology: Lymphedema N/A N/A ?Comorbid History: Cataracts, Chronic sinus N/A N/A ?problems/congestion, Anemia, ?Lymphedema, Asthma, Sleep ?Apnea, Tuberculosis, Angina, ?Hyper

## 2021-09-26 ENCOUNTER — Encounter: Payer: Medicare HMO | Admitting: Physician Assistant

## 2021-09-26 ENCOUNTER — Other Ambulatory Visit
Admission: RE | Admit: 2021-09-26 | Discharge: 2021-09-26 | Disposition: A | Discharge status: Home / Self Care | Payer: Medicare HMO | Source: Ambulatory Visit | Attending: Physician Assistant | Admitting: Physician Assistant

## 2021-09-26 ENCOUNTER — Other Ambulatory Visit: Payer: Self-pay

## 2021-09-26 DIAGNOSIS — L03116 Cellulitis of left lower limb: Secondary | ICD-10-CM | POA: Diagnosis present

## 2021-09-26 DIAGNOSIS — B999 Unspecified infectious disease: Secondary | ICD-10-CM | POA: Insufficient documentation

## 2021-09-26 DIAGNOSIS — L97811 Non-pressure chronic ulcer of other part of right lower leg limited to breakdown of skin: Secondary | ICD-10-CM | POA: Diagnosis not present

## 2021-09-26 NOTE — Progress Notes (Addendum)
COLLETTE, PESCADOR ([id number]) ?Visit Report for 09/26/2021 ?Chief Complaint Document Details ?Patient Name: [name], [name] ?Date of Service: 09/26/2021 11:15 AM ?Medical Record Number: [id number] ?Patient Account Number: [id number] ?Date of Birth/Sex: [birth year] (66 y.o. F) ?Treating RN: [name] Dredge ?Primary Care Provider: Ranae Plumber Other Clinician: ?Referring Provider: Ranae Plumber ?Treating Provider/Extender: Jeri Cos ?Weeks in Treatment: 8 ?Information Obtained from: Patient ?Chief Complaint ?Bilateral LE lymphedema with Left LE Ulcers ?Electronic Signature(s) ?Signed: 09/26/2021 11:21:53 AM By: Worthy Keeler PA-C ?Entered By: Worthy Keeler on 09/26/2021 11:21:53 ?[name], [name] ([id number]) ?-------------------------------------------------------------------------------- ?HPI Details ?Patient Name: [name], [name] ?Date of Service: 09/26/2021 11:15 AM ?Medical Record Number: [id number] ?Patient Account Number: [id number] ?Date of Birth/Sex: [birth year] (66 y.o. F) ?Treating RN: [name] Dredge ?Primary Care Provider: Ranae Plumber Other Clinician: ?Referring Provider: Ranae Plumber ?Treating Provider/Extender: Jeri Cos ?Weeks in Treatment: 8 ?History of Present Illness ?HPI Description: The patient is a 66 year old female with history of hypertension and a long-standing history of bilateral lower extremity ?lymphedema (first presented on 4/2) . She has had open ulcers in the past which have always responded to compression therapy. She had briefly ?been to a lymphedema clinic in the past which helped her at the time. ?this time around she stopped treatment of her lymphedema pumps approximately 2 weeks ago because of some pain in the knees and then ?noticed the right leg getting worse. She was seen by her PCP who put her on clindamycin 4 times a day 2 days ago. ?The patient has seen [facility] and Dr. Delana Meyer had seen her last year where a vascular study including venous and arterial duplex  studies were within ?normal limits. ?he had recommended compression stockings and lymphedema pumps and the patient has been using this in about 2 weeks ago. ?She is known to be diabetic but in the past few time she's gone to her primary care doctor her hemoglobin A1c has been normal. ?02/11/2015 - after her last visit she took my advice and went to the ER regarding the progressive cellulitis of her right lower extremity and she ?was admitted between July 17 and 22nd. She received IV antibiotics and then was sent home on a course of steroid-induced and oral antibiotics. ?She has improved much since then. ?02/17/2015 -- she has been doing fine and the weeping of her legs has remarkably gone down. She has no fresh issues. ?READMISSION ?01/15/18 ?This patient was given this clinic before most recently in 2016 seen by Dr. Con Memos. She has massive bilateral lymphedema and over the last 2 ?months this had weeping edema out of the left leg. She has compression pumps but her compliance with these has been minimal. She has [facility] they've been using TCA/ABDs/kerlix under an Ace wrap.she has had recent problems with cellulitis. She was apparently seen ?in the ER and 12/23/17 and given clindamycin. She was then followed by her primary doctor and given doxycycline and Keflex. The pain seems to ?have settled down. ?In April 2018 the patient had arterial studies done at [facility]. This showed triphasic waveforms throughout the right leg and ?mostly triphasic waveforms on the left except for monophasic at the posterior tibial artery distally. She was not felt to have evidence of right lower ?extremity arterial stenosis or significant problems on the left side. 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 She also had ?a right lower extremity venous Doppler in January 2018 this was limited by the patient's body  habitus and lymphedema. Most of the proximal ?veins were not  visualized ?The patient presents with an area of denuded skin on the anterior medial part of the left calf. There is weeping edema fluid here. ?01/22/18; the patient has somewhat better edema control using her compression pumps twice a day and as a result she has much better ?epithelialization on the left anterior calf area. Only a small open area remains. ?01/29/18; the patient has been compliant with her compression pumps. Both the areas on her calf that healed. The remaining area on the left ?anterior leg is fully epithelialized ?Readmission: ?02/20/2019 upon evaluation today patient presents for reevaluation due to issues that she is having with the bilateral lower extremities. She ?actually has wounds open on both legs. On the right she has an area in the crease of her leg on the right around the knee region which is actually ?draining quite a bit and actually has some fungal type appearance to it. She has been on nystatin powder that seems to have helped to some ?degree. In regard to the left lower extremity this is actually in the lower portion of her leg closer to the ankle and again is continuing to drain as ?well unfortunately. There does not appear to be any signs of active infection at this time which is good news. No fevers, chills, nausea, vomiting, ?or diarrhea. She tells me that since she was seen last year she is actually been doing quite well for the most part with regard to her lower ?extremities. Unfortunately she now is experiencing a little bit more drainage at this time. She is concerned about getting this under control so that ?it does not get significantly worse. ?02/27/2019 on evaluation today patient appears to be doing somewhat better in regard to her bilateral lower extremity wounds. She has been ?tolerating the dressing changes without complication. Fortunately there is no signs of active infection at this point. No fevers, chills, nausea, ?vomiting, or diarrhea. She did get her dressing  supplies which is excellent news she was extremely excited to get these. She also got paperwork ?from [employer] for their financial assistance program where they may be able to help her out in the future if needed with supplies at discounted ?prices. ?03/06/2019 on evaluation today patient appears to be doing a little worse with regard to both areas of weeping on her bilateral lower extremities. ?This is around the right medial knee and just above the left ankle. With that being said she is unfortunately not doing as well as I would like to ?see. I feel like she may need to potentially go see someone at the lymphedema clinic as the wraps that she needs or even beyond what we can do ?here at the wound care center. She really does not have wounds she just has open areas of weeping that are causing some difficulty for her. ?Subsequently because of this and the moisture I am concerned about the potential for infection I am going to likely give her a prophylactic ?antibiotic today, Keflex, just to be on the safe side. Nonetheless again there is no obvious signs of active infection at this time. ?03/13/2019 on evaluation today patient appears to be doing well with regard to her bilateral lower extremities where she has been weeping ?compared to even last week's evaluation. I see some areas of new skin growth which is excellent and overall I am very pleased with how things ?seem to be progressing. No fevers, chills, nausea, vomiting, or diarrhea. ?Moors,  [name] ([id number]) ?03/20/2019 on evaluation today patient unfortunately is continuing to have issues with significant edema of the left lower extremity. Her right side ?seems to be doing much better. Unfortunately her left side is showing increased weeping of the lower portion of her leg. This is quite unfortunate ?obviously we were hoping to get her into the lymphedema clinic they really do not seem to when I see her how if she is draining. Despite the fact ?this is really  not wound related but more lymphedema weeping related. Nonetheless I do not know that this can be helpful for her to even go for ?that appointment since again I am not sure there is much that they would actually do at this

## 2021-09-26 NOTE — Progress Notes (Addendum)
EMBRIE, MIKKELSEN ([id number]) ?Visit Report for 09/26/2021 ?Arrival Information Details ?Patient Name: [name], [name] ?Date of Service: 09/26/2021 11:15 AM ?Medical Record Number: [id number] ?Patient Account Number: [id number] ?Date of Birth/Sex: Aug 17, 1955 (66 y.o. F) ?Treating RN: [name] Dredge ?Primary Care Falan Hensler: Ranae Plumber Other Clinician: ?Referring Tandrea Kommer: Ranae Plumber ?Treating Divit Stipp/Extender: Jeri Cos ?Weeks in Treatment: 8 ?Visit Information History Since Last Visit ?Added or deleted any medications: No ?Patient Arrived: [name] ?Any new allergies or adverse reactions: No ?Arrival Time: 11:38 ?Had a fall or experienced change in No ?Accompanied By: self ?activities of daily living that may affect ?Transfer Assistance: None ?risk of falls: ?Patient Identification Verified: Yes ?Hospitalized since last visit: No ?Secondary Verification Process Completed: Yes ?Has Dressing in Place as Prescribed: Yes ?Pain Present Now: No ?Electronic Signature(s) ?Signed: 09/26/2021 4:35:55 PM By: [name] Dredge ?Entered By: [name] Dredge on 09/26/2021 11:40:37 ?CASSAUNDRA, RASCH ([id number]) ?-------------------------------------------------------------------------------- ?Clinic Level of Care Assessment Details ?Patient Name: [name] ?Date of Service: 09/26/2021 11:15 AM ?Medical Record Number: [id number] ?Patient Account Number: [id number] ?Date of Birth/Sex: 07-Apr-1956 (66 y.o. F) ?Treating RN: [name] Dredge ?Primary Care Taysha Majewski: Ranae Plumber Other Clinician: ?Referring Leshia Kope: Ranae Plumber ?Treating Cayleen Benjamin/Extender: Jeri Cos ?Weeks in Treatment: 8 ?Clinic Level of Care Assessment Items ?TOOL 4 Quantity Score ?'[]'$  - Use when only an EandM is performed on FOLLOW-UP visit 0 ?ASSESSMENTS - Nursing Assessment / Reassessment ?'[]'$  - Reassessment of Co-morbidities (includes updates in patient status) 0 ?X- 1 5 ?Reassessment of Adherence to Treatment Plan ?ASSESSMENTS - Wound and Skin  Assessment / Reassessment ?X - Simple Wound Assessment / Reassessment - one wound 1 5 ?'[]'$  - 0 ?Complex Wound Assessment / Reassessment - multiple wounds ?'[]'$  - 0 ?Dermatologic / Skin Assessment (not related to wound area) ?ASSESSMENTS - Focused Assessment ?'[]'$  - Circumferential Edema Measurements - multi extremities 0 ?'[]'$  - 0 ?Nutritional Assessment / Counseling / Intervention ?'[]'$  - 0 ?Lower Extremity Assessment (monofilament, tuning fork, pulses) ?'[]'$  - 0 ?Peripheral Arterial Disease Assessment (using hand held doppler) ?ASSESSMENTS - Ostomy and/or Continence Assessment and Care ?'[]'$  - Incontinence Assessment and Management 0 ?'[]'$  - 0 ?Ostomy Care Assessment and Management (repouching, etc.) ?PROCESS - Coordination of Care ?X - Simple Patient / Family Education for ongoing care 1 15 ?'[]'$  - 0 ?Complex (extensive) Patient / Family Education for ongoing care ?'[]'$  - 0 ?Staff obtains Consents, Records, Test Results / Process Orders ?'[]'$  - 0 ?Staff telephones HHA, Nursing Homes / Clarify orders / etc ?'[]'$  - 0 ?Routine Transfer to another Facility (non-emergent condition) ?'[]'$  - 0 ?Routine Hospital Admission (non-emergent condition) ?'[]'$  - 0 ?New Admissions / Biomedical engineer / Ordering NPWT, Apligraf, etc. ?'[]'$  - 0 ?Emergency Hospital Admission (emergent condition) ?X- 1 10 ?Simple Discharge Coordination ?'[]'$  - 0 ?Complex (extensive) Discharge Coordination ?PROCESS - Special Needs ?'[]'$  - Pediatric / Minor Patient Management 0 ?'[]'$  - 0 ?Isolation Patient Management ?'[]'$  - 0 ?Hearing / Language / Visual special needs ?'[]'$  - 0 ?Assessment of Community assistance (transportation, D/C planning, etc.) ?'[]'$  - 0 ?Additional assistance / Altered mentation ?'[]'$  - 0 ?Support Surface(s) Assessment (bed, cushion, seat, etc.) ?INTERVENTIONS - Wound Cleansing / Measurement ?MARLISA, CARIDI ([id number]) ?X- 1 5 ?Simple Wound Cleansing - one wound ?'[]'$  - 0 ?Complex Wound Cleansing - multiple wounds ?X- 1 5 ?Wound Imaging (photographs - any number  of wounds) ?'[]'$  - 0 ?Wound Tracing (instead of photographs) ?X- 1 5 ?Simple Wound Measurement - one wound ?'[]'$  - 0 ?Complex Wound  Measurement - multiple wounds ?INTERVENTIONS - Wound Dressings ?X - Small Wound Dressing one or multiple wounds 1 10 ?'[]'$  - 0 ?Medium Wound Dressing one or multiple wounds ?'[]'$  - 0 ?Large Wound Dressing one or multiple wounds ?'[]'$  - 0 ?Application of Medications - topical ?'[]'$  - 0 ?Application of Medications - injection ?INTERVENTIONS - Miscellaneous ?'[]'$  - External ear exam 0 ?'[]'$  - 0 ?Specimen Collection (cultures, biopsies, blood, body fluids, etc.) ?'[]'$  - 0 ?Specimen(s) / Culture(s) sent or taken to Lab for analysis ?'[]'$  - 0 ?Patient Transfer (multiple staff / Civil Service fast streamer / Similar devices) ?'[]'$  - 0 ?Simple Staple / Suture removal (25 or less) ?'[]'$  - 0 ?Complex Staple / Suture removal (26 or more) ?'[]'$  - 0 ?Hypo / Hyperglycemic Management (close monitor of Blood Glucose) ?'[]'$  - 0 ?Ankle / Brachial Index (ABI) - do not check if billed separately ?X- 1 5 ?Vital Signs ?Has the patient been seen at the hospital within the last three years: Yes ?Total Score: 65 ?Level Of Care: New/Established - Level ?2 ?Electronic Signature(s) ?Signed: 09/26/2021 4:35:55 PM By: [name] Dredge ?Entered By: [name] Dredge on 09/26/2021 14:55:53 ?NAMIAH, DUNNAVANT ([id number]) ?-------------------------------------------------------------------------------- ?Encounter Discharge Information Details ?Patient Name: [name] ?Date of Service: 09/26/2021 11:15 AM ?Medical Record Number: [id number] ?Patient Account Number: [id number] ?Date of Birth/Sex: September 20, 1955 (66 y.o. F) ?Treating RN: [name] Dredge ?Primary Care Karmel Patricelli: Ranae Plumber Other Clinician: ?Referring Quintana Canelo: Ranae Plumber ?Treating Charliene Inoue/Extender: Jeri Cos ?Weeks in Treatment: 8 ?Encounter Discharge Information Items ?Discharge Condition: Stable ?Ambulatory Status: Ambulatory ?Discharge Destination: Home ?Transportation: Other ?Accompanied  By: self ?Schedule Follow-up Appointment: Yes ?Clinical Summary of Care: ?Electronic Signature(s) ?Signed: 09/26/2021 2:57:15 PM By: [name] Dredge ?Entered By: [name] Dredge on 09/26/2021 14:57:15 ?PHYLISS, HULICK ([id number]) ?-------------------------------------------------------------------------------- ?Lower Extremity Assessment Details ?Patient Name: ROBINA, HAMOR ?Date of Service: 09/26/2021 11:15 AM ?Medical Record Number: [id number] ?Patient Account Number: [id number] ?Date of Birth/Sex: [birth year] (66 y.o. F) ?Treating RN: [name] Dredge ?Primary Care Jaycub Noorani: Ranae Plumber Other Clinician: ?Referring Ann Bohne: Ranae Plumber ?Treating Eris Hannan/Extender: Jeri Cos ?Weeks in Treatment: 8 ?Edema Assessment ?Assessed: [Left: No] [Right: No] ?Edema: [Left: Yes] [Right: Yes] ?Calf ?Left: Right: ?Point of Measurement: 39 cm From Medial Instep 93 cm 94 cm ?Ankle ?Left: Right: ?Point of Measurement: 9 cm From Medial Instep 53 cm 55.4 cm ?Vascular Assessment ?Pulses: ?Dorsalis Pedis ?Palpable: [Left:Yes] [Right:Yes] ?Electronic Signature(s) ?Signed: 09/26/2021 4:35:55 PM By: [name] Dredge ?Entered By: [name] Dredge on 09/26/2021 11:57:31 ?ASHLYND, MICHNA ([id number]) ?-------------------------------------------------------------------------------- ?Multi Wound Chart Details ?Patient Name: WESLYNN, KE ?Date of Service: 09/26/2021 11:15 AM ?Medical Record Number: [id number] ?Patient Account Number: [id number] ?Date of Birth/Sex: 1955/09/08 (66 y.o. F) ?Treating RN: [name] Dredge ?Primary Care Francheska Villeda: Ranae Plumber Other Clinician: ?Referring Doloros Kwolek: Ranae Plumber ?Treating Shulamit Donofrio/Extender: Jeri Cos ?Weeks in Treatment: 8 ?Vital Signs ?Height(in): 63 ?Pulse(bpm): 69 ?Weight(lbs): 372 ?Blood Pressure(mmHg): 120/79 ?Body Mass Index(BMI): 65.9 ?Temperature(??F): 97.7 ?Respiratory Rate(breaths/min): 18 ?Photos: [N/A:N/A] ?Wound Location: Right Lower Leg N/A N/A ?Wounding Event: Blister N/A  N/A ?Primary Etiology: Lymphedema N/A N/A ?Comorbid History: Cataracts, Chronic sinus N/A N/A ?problems/congestion, Anemia, ?Lymphedema, Asthma, Sleep ?Apnea, Tuberculosis, Angina, ?Hypertension, Gout, Carmelina Peal

## 2021-09-30 LAB — AEROBIC CULTURE W GRAM STAIN (SUPERFICIAL SPECIMEN)

## 2021-10-03 ENCOUNTER — Other Ambulatory Visit: Payer: Self-pay

## 2021-10-03 ENCOUNTER — Inpatient Hospital Stay: Payer: Medicare HMO

## 2021-10-03 ENCOUNTER — Inpatient Hospital Stay
Admission: EM | Admit: 2021-10-03 | Discharge: 2021-10-08 | DRG: 603 | Disposition: A | Discharge status: Home / Self Care | Payer: Medicare HMO | Source: Ambulatory Visit | Attending: Internal Medicine | Admitting: Internal Medicine

## 2021-10-03 ENCOUNTER — Encounter: Payer: Medicare HMO | Admitting: Physician Assistant

## 2021-10-03 DIAGNOSIS — L280 Lichen simplex chronicus: Secondary | ICD-10-CM | POA: Diagnosis present

## 2021-10-03 DIAGNOSIS — D631 Anemia in chronic kidney disease: Secondary | ICD-10-CM | POA: Diagnosis present

## 2021-10-03 DIAGNOSIS — I130 Hypertensive heart and chronic kidney disease with heart failure and stage 1 through stage 4 chronic kidney disease, or unspecified chronic kidney disease: Secondary | ICD-10-CM | POA: Diagnosis present

## 2021-10-03 DIAGNOSIS — L97829 Non-pressure chronic ulcer of other part of left lower leg with unspecified severity: Secondary | ICD-10-CM | POA: Diagnosis present

## 2021-10-03 DIAGNOSIS — L03116 Cellulitis of left lower limb: Principal | ICD-10-CM | POA: Diagnosis present

## 2021-10-03 DIAGNOSIS — M1990 Unspecified osteoarthritis, unspecified site: Secondary | ICD-10-CM | POA: Diagnosis present

## 2021-10-03 DIAGNOSIS — I890 Lymphedema, not elsewhere classified: Secondary | ICD-10-CM

## 2021-10-03 DIAGNOSIS — T368X5A Adverse effect of other systemic antibiotics, initial encounter: Secondary | ICD-10-CM | POA: Diagnosis present

## 2021-10-03 DIAGNOSIS — I10 Essential (primary) hypertension: Secondary | ICD-10-CM | POA: Diagnosis not present

## 2021-10-03 DIAGNOSIS — Z6841 Body Mass Index (BMI) 40.0 and over, adult: Secondary | ICD-10-CM | POA: Diagnosis not present

## 2021-10-03 DIAGNOSIS — Z8349 Family history of other endocrine, nutritional and metabolic diseases: Secondary | ICD-10-CM

## 2021-10-03 DIAGNOSIS — D696 Thrombocytopenia, unspecified: Secondary | ICD-10-CM

## 2021-10-03 DIAGNOSIS — Z9989 Dependence on other enabling machines and devices: Secondary | ICD-10-CM

## 2021-10-03 DIAGNOSIS — G4733 Obstructive sleep apnea (adult) (pediatric): Secondary | ICD-10-CM

## 2021-10-03 DIAGNOSIS — Z79899 Other long term (current) drug therapy: Secondary | ICD-10-CM

## 2021-10-03 DIAGNOSIS — D638 Anemia in other chronic diseases classified elsewhere: Secondary | ICD-10-CM | POA: Diagnosis not present

## 2021-10-03 DIAGNOSIS — Z881 Allergy status to other antibiotic agents status: Secondary | ICD-10-CM

## 2021-10-03 DIAGNOSIS — Z87891 Personal history of nicotine dependence: Secondary | ICD-10-CM | POA: Diagnosis not present

## 2021-10-03 DIAGNOSIS — Z803 Family history of malignant neoplasm of breast: Secondary | ICD-10-CM

## 2021-10-03 DIAGNOSIS — I5032 Chronic diastolic (congestive) heart failure: Secondary | ICD-10-CM | POA: Diagnosis present

## 2021-10-03 DIAGNOSIS — L271 Localized skin eruption due to drugs and medicaments taken internally: Secondary | ICD-10-CM | POA: Diagnosis present

## 2021-10-03 DIAGNOSIS — N1831 Chronic kidney disease, stage 3a: Secondary | ICD-10-CM | POA: Diagnosis present

## 2021-10-03 DIAGNOSIS — J45909 Unspecified asthma, uncomplicated: Secondary | ICD-10-CM | POA: Diagnosis present

## 2021-10-03 DIAGNOSIS — J4520 Mild intermittent asthma, uncomplicated: Secondary | ICD-10-CM

## 2021-10-03 DIAGNOSIS — Z888 Allergy status to other drugs, medicaments and biological substances status: Secondary | ICD-10-CM | POA: Diagnosis not present

## 2021-10-03 DIAGNOSIS — G629 Polyneuropathy, unspecified: Secondary | ICD-10-CM | POA: Diagnosis present

## 2021-10-03 DIAGNOSIS — Z886 Allergy status to analgesic agent status: Secondary | ICD-10-CM

## 2021-10-03 LAB — CBC WITH DIFFERENTIAL/PLATELET
Abs Immature Granulocytes: 0.02 10*3/uL (ref 0.00–0.07)
Basophils Absolute: 0 10*3/uL (ref 0.0–0.1)
Basophils Relative: 1 %
Eosinophils Absolute: 0.1 10*3/uL (ref 0.0–0.5)
Eosinophils Relative: 1 %
HCT: 42.9 % (ref 36.0–46.0)
Hemoglobin: 12.9 g/dL (ref 12.0–15.0)
Immature Granulocytes: 0 %
Lymphocytes Relative: 35 %
Lymphs Abs: 1.8 10*3/uL (ref 0.7–4.0)
MCH: 25.6 pg — ABNORMAL LOW (ref 26.0–34.0)
MCHC: 30.1 g/dL (ref 30.0–36.0)
MCV: 85.1 fL (ref 80.0–100.0)
Monocytes Absolute: 0.6 10*3/uL (ref 0.1–1.0)
Monocytes Relative: 12 %
Neutro Abs: 2.7 10*3/uL (ref 1.7–7.7)
Neutrophils Relative %: 51 %
Platelets: 199 10*3/uL (ref 150–400)
RBC: 5.04 MIL/uL (ref 3.87–5.11)
RDW: 14.9 % (ref 11.5–15.5)
WBC: 5.2 10*3/uL (ref 4.0–10.5)
nRBC: 0 % (ref 0.0–0.2)

## 2021-10-03 LAB — URINALYSIS, ROUTINE W REFLEX MICROSCOPIC
Bacteria, UA: NONE SEEN
Bilirubin Urine: NEGATIVE
Glucose, UA: NEGATIVE mg/dL
Hgb urine dipstick: NEGATIVE
Ketones, ur: NEGATIVE mg/dL
Leukocytes,Ua: NEGATIVE
Nitrite: NEGATIVE
Protein, ur: 30 mg/dL — AB
Specific Gravity, Urine: 1.024 (ref 1.005–1.030)
pH: 5 (ref 5.0–8.0)

## 2021-10-03 LAB — COMPREHENSIVE METABOLIC PANEL
ALT: 15 U/L (ref 0–44)
AST: 22 U/L (ref 15–41)
Albumin: 3.7 g/dL (ref 3.5–5.0)
Alkaline Phosphatase: 144 U/L — ABNORMAL HIGH (ref 38–126)
Anion gap: 10 (ref 5–15)
BUN: 19 mg/dL (ref 8–23)
CO2: 26 mmol/L (ref 22–32)
Calcium: 9 mg/dL (ref 8.9–10.3)
Chloride: 100 mmol/L (ref 98–111)
Creatinine, Ser: 1.12 mg/dL — ABNORMAL HIGH (ref 0.44–1.00)
GFR, Estimated: 54 mL/min — ABNORMAL LOW (ref >60)
Glucose, Bld: 90 mg/dL (ref 70–99)
Potassium: 4.2 mmol/L (ref 3.5–5.1)
Sodium: 136 mmol/L (ref 135–145)
Total Bilirubin: 0.8 mg/dL (ref 0.3–1.2)
Total Protein: 8.7 g/dL — ABNORMAL HIGH (ref 6.5–8.1)

## 2021-10-03 LAB — C-REACTIVE PROTEIN: CRP: 2.1 mg/dL — ABNORMAL HIGH (ref <1.0)

## 2021-10-03 LAB — SEDIMENTATION RATE: Sed Rate: 63 mm/hr — ABNORMAL HIGH (ref 0–30)

## 2021-10-03 LAB — BRAIN NATRIURETIC PEPTIDE: B Natriuretic Peptide: 39.4 pg/mL (ref 0.0–100.0)

## 2021-10-03 LAB — LACTIC ACID, PLASMA: Lactic Acid, Venous: 1.7 mmol/L (ref 0.5–1.9)

## 2021-10-03 MED ORDER — ACETAMINOPHEN 325 MG PO TABS
650.0000 mg | ORAL_TABLET | Freq: Four times a day (QID) | ORAL | Status: DC | PRN
  Administered 2021-10-04: 650 mg via ORAL
  Filled 2021-10-03: qty 2

## 2021-10-03 MED ORDER — ALBUTEROL SULFATE (2.5 MG/3ML) 0.083% IN NEBU
3.0000 mL | INHALATION_SOLUTION | RESPIRATORY_TRACT | Status: DC | PRN
  Administered 2021-10-05 – 2021-10-07 (×2): 3 mL via RESPIRATORY_TRACT
  Filled 2021-10-03 (×2): qty 3

## 2021-10-03 MED ORDER — LORATADINE 10 MG PO TABS
10.0000 mg | ORAL_TABLET | Freq: Every day | ORAL | Status: DC | PRN
  Administered 2021-10-05: 10 mg via ORAL
  Filled 2021-10-03: qty 1

## 2021-10-03 MED ORDER — HYDRALAZINE HCL 20 MG/ML IJ SOLN: 5.0000 mg | INTRAMUSCULAR | Status: DC | PRN

## 2021-10-03 MED ORDER — ENOXAPARIN SODIUM 100 MG/ML IJ SOSY
0.5000 mg/kg | PREFILLED_SYRINGE | INTRAMUSCULAR | Status: DC
  Administered 2021-10-03 – 2021-10-07 (×5): 82.5 mg via SUBCUTANEOUS
  Filled 2021-10-03 (×5): qty 0.82

## 2021-10-03 MED ORDER — SODIUM CHLORIDE 0.9% FLUSH: 10.0000 mL | INTRAVENOUS | Status: DC | PRN

## 2021-10-03 MED ORDER — ONDANSETRON HCL 4 MG/2ML IJ SOLN: 4.0000 mg | Freq: Three times a day (TID) | INTRAMUSCULAR | Status: DC | PRN

## 2021-10-03 MED ORDER — SODIUM CHLORIDE 0.9 % IV SOLN
2.0000 g | INTRAVENOUS | Status: DC
  Administered 2021-10-03: 2 g via INTRAVENOUS
  Filled 2021-10-03 (×2): qty 20

## 2021-10-03 MED ORDER — METHOCARBAMOL 500 MG PO TABS
500.0000 mg | ORAL_TABLET | Freq: Three times a day (TID) | ORAL | Status: DC | PRN
  Administered 2021-10-04: 500 mg via ORAL
  Filled 2021-10-03 (×4): qty 1

## 2021-10-03 MED ORDER — FUROSEMIDE 40 MG PO TABS
40.0000 mg | ORAL_TABLET | Freq: Two times a day (BID) | ORAL | Status: DC
  Administered 2021-10-03 – 2021-10-07 (×8): 40 mg via ORAL
  Filled 2021-10-03 (×8): qty 1

## 2021-10-03 MED ORDER — ATENOLOL 50 MG PO TABS
50.0000 mg | ORAL_TABLET | Freq: Every day | ORAL | Status: DC
  Administered 2021-10-03 – 2021-10-08 (×4): 50 mg via ORAL
  Filled 2021-10-03: qty 1
  Filled 2021-10-03: qty 2
  Filled 2021-10-03 (×4): qty 1

## 2021-10-03 MED ORDER — SODIUM CHLORIDE 0.9 % IV SOLN
1.0000 g | Freq: Once | INTRAVENOUS | Status: DC
  Filled 2021-10-03: qty 10

## 2021-10-03 MED ORDER — ENOXAPARIN SODIUM 40 MG/0.4ML IJ SOSY: 40.0000 mg | PREFILLED_SYRINGE | INTRAMUSCULAR | Status: DC

## 2021-10-03 MED ORDER — SODIUM CHLORIDE 0.9% FLUSH
10.0000 mL | Freq: Two times a day (BID) | INTRAVENOUS | Status: DC
  Administered 2021-10-03 – 2021-10-08 (×10): 10 mL

## 2021-10-03 NOTE — Progress Notes (Addendum)
CLARENE, CURRAN ([id number]) ?Visit Report for 10/03/2021 ?Chief Complaint Document Details ?Patient Name: [name], [name] ?Date of Service: 10/03/2021 11:15 AM ?Medical Record Number: [id number] ?Patient Account Number: [id number] ?Date of Birth/Sex: 1955/08/29 (66 y.o. F) ?Treating RN: [name] Dredge ?Primary Care Provider: Ranae Plumber Other Clinician: ?Referring Provider: Ranae Plumber ?Treating Provider/Extender: Jeri Cos ?Weeks in Treatment: 9 ?Information Obtained from: Patient ?Chief Complaint ?Bilateral LE lymphedema with Left LE Ulcers ?Electronic Signature(s) ?Signed: 10/03/2021 11:21:25 AM By: Worthy Keeler PA-C ?Entered By: Worthy Keeler on 10/03/2021 11:21:24 ?[name], [name] ([id number]) ?-------------------------------------------------------------------------------- ?HPI Details ?Patient Name: [name], [name] ?Date of Service: 10/03/2021 11:15 AM ?Medical Record Number: [id number] ?Patient Account Number: [id number] ?Date of Birth/Sex: [birth year] (66 y.o. F) ?Treating RN: [name] Dredge ?Primary Care Provider: Ranae Plumber Other Clinician: ?Referring Provider: Ranae Plumber ?Treating Provider/Extender: Jeri Cos ?Weeks in Treatment: 9 ?History of Present Illness ?HPI Description: The patient is a 66 year old female with history of hypertension and a long-standing history of bilateral lower extremity ?lymphedema (first presented on 4/2) . She has had open ulcers in the past which have always responded to compression therapy. She had briefly ?been to a lymphedema clinic in the past which helped her at the time. ?this time around she stopped treatment of her lymphedema pumps approximately 2 weeks ago because of some pain in the knees and then ?noticed the right leg getting worse. She was seen by her PCP who put her on clindamycin 4 times a day 2 days ago. ?The patient has seen [facility] and Dr. Delana Meyer had seen her last year where a vascular study including venous and arterial duplex  studies were within ?normal limits. ?he had recommended compression stockings and lymphedema pumps and the patient has been using this in about 2 weeks ago. ?She is known to be diabetic but in the past few time she's gone to her primary care doctor her hemoglobin A1c has been normal. ?02/11/2015 - after her last visit she took my advice and went to the ER regarding the progressive cellulitis of her right lower extremity and she ?was admitted between July 17 and 22nd. She received IV antibiotics and then was sent home on a course of steroid-induced and oral antibiotics. ?She has improved much since then. ?02/17/2015 -- she has been doing fine and the weeping of her legs has remarkably gone down. She has no fresh issues. ?READMISSION ?01/15/18 ?This patient was given this clinic before most recently in 2016 seen by Dr. Con Memos. She has massive bilateral lymphedema and over the last 2 ?months this had weeping edema out of the left leg. She has compression pumps but her compliance with these has been minimal. She has [facility] they've been using TCA/ABDs/kerlix under an Ace wrap.she has had recent problems with cellulitis. She was apparently seen ?in the ER and 12/23/17 and given clindamycin. She was then followed by her primary doctor and given doxycycline and Keflex. The pain seems to ?have settled down. ?In April 2018 the patient had arterial studies done at [facility]. This showed triphasic waveforms throughout the right leg and ?mostly triphasic waveforms on the left except for monophasic at the posterior tibial artery distally. She was not felt to have evidence of right lower ?extremity arterial stenosis or significant problems on the left side. 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 She also had ?a right lower extremity venous Doppler in January 2018 this was limited by the patient's body  habitus and lymphedema. Most of the proximal ?veins were not  visualized ?The patient presents with an area of denuded skin on the anterior medial part of the left calf. There is weeping edema fluid here. ?01/22/18; the patient has somewhat better edema control using her compression pumps twice a day and as a result she has much better ?epithelialization on the left anterior calf area. Only a small open area remains. ?01/29/18; the patient has been compliant with her compression pumps. Both the areas on her calf that healed. The remaining area on the left ?anterior leg is fully epithelialized ?Readmission: ?02/20/2019 upon evaluation today patient presents for reevaluation due to issues that she is having with the bilateral lower extremities. She ?actually has wounds open on both legs. On the right she has an area in the crease of her leg on the right around the knee region which is actually ?draining quite a bit and actually has some fungal type appearance to it. She has been on nystatin powder that seems to have helped to some ?degree. In regard to the left lower extremity this is actually in the lower portion of her leg closer to the ankle and again is continuing to drain as ?well unfortunately. There does not appear to be any signs of active infection at this time which is good news. No fevers, chills, nausea, vomiting, ?or diarrhea. She tells me that since she was seen last year she is actually been doing quite well for the most part with regard to her lower ?extremities. Unfortunately she now is experiencing a little bit more drainage at this time. She is concerned about getting this under control so that ?it does not get significantly worse. ?02/27/2019 on evaluation today patient appears to be doing somewhat better in regard to her bilateral lower extremity wounds. She has been ?tolerating the dressing changes without complication. Fortunately there is no signs of active infection at this point. No fevers, chills, nausea, ?vomiting, or diarrhea. She did get her dressing  supplies which is excellent news she was extremely excited to get these. She also got paperwork ?from [employer] for their financial assistance program where they may be able to help her out in the future if needed with supplies at discounted ?prices. ?03/06/2019 on evaluation today patient appears to be doing a little worse with regard to both areas of weeping on her bilateral lower extremities. ?This is around the right medial knee and just above the left ankle. With that being said she is unfortunately not doing as well as I would like to ?see. I feel like she may need to potentially go see someone at the lymphedema clinic as the wraps that she needs or even beyond what we can do ?here at the wound care center. She really does not have wounds she just has open areas of weeping that are causing some difficulty for her. ?Subsequently because of this and the moisture I am concerned about the potential for infection I am going to likely give her a prophylactic ?antibiotic today, Keflex, just to be on the safe side. Nonetheless again there is no obvious signs of active infection at this time. ?03/13/2019 on evaluation today patient appears to be doing well with regard to her bilateral lower extremities where she has been weeping ?compared to even last week's evaluation. I see some areas of new skin growth which is excellent and overall I am very pleased with how things ?seem to be progressing. No fevers, chills, nausea, vomiting, or diarrhea. ?Collazos,  [name] ([id number]) ?03/20/2019 on evaluation today patient unfortunately is continuing to have issues with significant edema of the left lower extremity. Her right side ?seems to be doing much better. Unfortunately her left side is showing increased weeping of the lower portion of her leg. This is quite unfortunate ?obviously we were hoping to get her into the lymphedema clinic they really do not seem to when I see her how if she is draining. Despite the fact ?this is really  not wound related but more lymphedema weeping related. Nonetheless I do not know that this can be helpful for her to even go for ?that appointment since again I am not sure there is much that they would actually do at this

## 2021-10-03 NOTE — Progress Notes (Signed)
PHARMACIST - PHYSICIAN COMMUNICATION ? ?CONCERNING:  Enoxaparin (Lovenox) for DVT Prophylaxis  ? ? ?RECOMMENDATION: ?Patient was prescribed enoxaprin '40mg'$  q24 hours for VTE prophylaxis.  ? ?Filed Weights  ? 10/03/21 1300  ?Weight: (!) 167.4 kg (369 lb)  ? ? ?Body mass index is 64.34 kg/m?. ? ?Estimated Creatinine Clearance: 77.3 mL/min (A) (by C-G formula based on SCr of 1.12 mg/dL (H)). ? ? ?Based on [facility] patient is candidate for enoxaparin 0.'5mg'$ /kg TBW SQ every 24 hours based on BMI being >30. ? ?DESCRIPTION: ?Pharmacy has adjusted enoxaparin dose per [facility] policy. ? ?Patient is now receiving enoxaparin 82.5 mg every 24 hours  ? ? ?Pernell Dupre, PharmD, BCPS ?Clinical Pharmacist ?10/03/2021 ?2:13 PM ? ? ?

## 2021-10-03 NOTE — Progress Notes (Signed)
[name], [name] ([id number]) ?Visit Report for 10/03/2021 ?Arrival Information Details ?Patient Name: [name], [name] ?Date of Service: 10/03/2021 11:15 AM ?Medical Record Number: [id number] ?Patient Account Number: [id number] ?Date of Birth/Sex: Feb 06, 1956 (66 y.o. F) ?Treating RN: [name] Dredge ?Primary Care Fia Hebert: Ranae Plumber Other Clinician: ?Referring Javell Blackburn: Ranae Plumber ?Treating Demri Poulton/Extender: Jeri Cos ?Weeks in Treatment: 9 ?Visit Information History Since Last Visit ?Added or deleted any medications: No ?Patient Arrived: [name] ?Any new allergies or adverse reactions: No ?Arrival Time: 11:19 ?Had a fall or experienced change in No ?Accompanied By: self ?activities of daily living that may affect ?Transfer Assistance: None ?risk of falls: ?Patient Identification Verified: Yes ?Hospitalized since last visit: No ?Secondary Verification Process Completed: Yes ?Has Dressing in Place as Prescribed: Yes ?Pain Present Now: No ?Electronic Signature(s) ?Signed: 10/03/2021 4:01:08 PM By: [name] Dredge ?Entered By: [name] Dredge on 10/03/2021 11:19:37 ?[name], [name] ([id number]) ?-------------------------------------------------------------------------------- ?Clinic Level of Care Assessment Details ?Patient Name: [name] ?Date of Service: 10/03/2021 11:15 AM ?Medical Record Number: [id number] ?Patient Account Number: [id number] ?Date of Birth/Sex: [birth year] (65 y.o. F) ?Treating RN: [name] Dredge ?Primary Care Ralphie Lovelady: Ranae Plumber Other Clinician: ?Referring Lujean Ebright: Ranae Plumber ?Treating Leemon Ayala/Extender: Jeri Cos ?Weeks in Treatment: 9 ?Clinic Level of Care Assessment Items ?TOOL 4 Quantity Score ?'[]'$  - Use when only an EandM is performed on FOLLOW-UP visit 0 ?ASSESSMENTS - Nursing Assessment / Reassessment ?X - Reassessment of Co-morbidities (includes updates in patient status) 1 10 ?'[]'$  - 0 ?Reassessment of Adherence to Treatment Plan ?ASSESSMENTS - Wound and Skin  Assessment / Reassessment ?X - Simple Wound Assessment / Reassessment - one wound 1 5 ?'[]'$  - 0 ?Complex Wound Assessment / Reassessment - multiple wounds ?'[]'$  - 0 ?Dermatologic / Skin Assessment (not related to wound area) ?ASSESSMENTS - Focused Assessment ?X - Circumferential Edema Measurements - multi extremities 1 5 ?'[]'$  - 0 ?Nutritional Assessment / Counseling / Intervention ?'[]'$  - 0 ?Lower Extremity Assessment (monofilament, tuning fork, pulses) ?'[]'$  - 0 ?Peripheral Arterial Disease Assessment (using hand held doppler) ?ASSESSMENTS - Ostomy and/or Continence Assessment and Care ?'[]'$  - Incontinence Assessment and Management 0 ?'[]'$  - 0 ?Ostomy Care Assessment and Management (repouching, etc.) ?PROCESS - Coordination of Care ?X - Simple Patient / Family Education for ongoing care 1 15 ?'[]'$  - 0 ?Complex (extensive) Patient / Family Education for ongoing care ?'[]'$  - 0 ?Staff obtains Consents, Records, Test Results / Process Orders ?'[]'$  - 0 ?Staff telephones HHA, Nursing Homes / Clarify orders / etc ?'[]'$  - 0 ?Routine Transfer to another Facility (non-emergent condition) ?'[]'$  - 0 ?Routine Hospital Admission (non-emergent condition) ?'[]'$  - 0 ?New Admissions / Biomedical engineer / Ordering NPWT, Apligraf, etc. ?'[]'$  - 0 ?Emergency Hospital Admission (emergent condition) ?X- 1 10 ?Simple Discharge Coordination ?'[]'$  - 0 ?Complex (extensive) Discharge Coordination ?PROCESS - Special Needs ?'[]'$  - Pediatric / Minor Patient Management 0 ?'[]'$  - 0 ?Isolation Patient Management ?'[]'$  - 0 ?Hearing / Language / Visual special needs ?'[]'$  - 0 ?Assessment of Community assistance (transportation, D/C planning, etc.) ?'[]'$  - 0 ?Additional assistance / Altered mentation ?'[]'$  - 0 ?Support Surface(s) Assessment (bed, cushion, seat, etc.) ?INTERVENTIONS - Wound Cleansing / Measurement ?[name], [name] ([id number]) ?X- 1 5 ?Simple Wound Cleansing - one wound ?'[]'$  - 0 ?Complex Wound Cleansing - multiple wounds ?X- 1 5 ?Wound Imaging (photographs - any number  of wounds) ?'[]'$  - 0 ?Wound Tracing (instead of photographs) ?X- 1 5 ?Simple Wound Measurement - one wound ?'[]'$  - 0 ?  Complex Wound Measurement - multiple wounds ?INTERVENTIONS - Wound Dressings ?X - Small Wound Dressing one or multiple wounds 1 10 ?'[]'$  - 0 ?Medium Wound Dressing one or multiple wounds ?'[]'$  - 0 ?Large Wound Dressing one or multiple wounds ?'[]'$  - 0 ?Application of Medications - topical ?'[]'$  - 0 ?Application of Medications - injection ?INTERVENTIONS - Miscellaneous ?'[]'$  - External ear exam 0 ?'[]'$  - 0 ?Specimen Collection (cultures, biopsies, blood, body fluids, etc.) ?'[]'$  - 0 ?Specimen(s) / Culture(s) sent or taken to Lab for analysis ?'[]'$  - 0 ?Patient Transfer (multiple staff / Civil Service fast streamer / Similar devices) ?'[]'$  - 0 ?Simple Staple / Suture removal (25 or less) ?'[]'$  - 0 ?Complex Staple / Suture removal (26 or more) ?'[]'$  - 0 ?Hypo / Hyperglycemic Management (close monitor of Blood Glucose) ?'[]'$  - 0 ?Ankle / Brachial Index (ABI) - do not check if billed separately ?X- 1 5 ?Vital Signs ?Has the patient been seen at the hospital within the last three years: Yes ?Total Score: 75 ?Level Of Care: New/Established - Level ?2 ?Electronic Signature(s) ?Signed: 10/03/2021 4:01:08 PM By: [name] Dredge ?Entered By: [name] Dredge on 10/03/2021 14:16:34 ?[name], [name] ([id number]) ?-------------------------------------------------------------------------------- ?Encounter Discharge Information Details ?Patient Name: [name], [name] ?Date of Service: 10/03/2021 11:15 AM ?Medical Record Number: [id number] ?Patient Account Number: [id number] ?Date of Birth/Sex: 1956-05-12 (66 y.o. F) ?Treating RN: [name] Dredge ?Primary Care Burman Bruington: Ranae Plumber Other Clinician: ?Referring Hikeem Andersson: Ranae Plumber ?Treating Mykle Pascua/Extender: Jeri Cos ?Weeks in Treatment: 9 ?Encounter Discharge Information Items ?Discharge Condition: Stable ?Ambulatory Status: [name] ?Discharge Destination: Emergency Room ?Telephoned: Yes ?Orders Sent:  No ?Transportation: Other ?Accompanied By: self ?Schedule Follow-up Appointment: Yes ?Clinical Summary of Care: ?Notes ?PA Stone called and made Ed staff aware patient was coming over and what for. Patient stated she would have her medical transport take her over ?to the ED. ?Electronic Signature(s) ?Signed: 10/03/2021 2:18:43 PM By: [name] Dredge ?Entered By: [name] Dredge on 10/03/2021 14:18:43 ?[name], [name] ([id number]) ?-------------------------------------------------------------------------------- ?Lower Extremity Assessment Details ?Patient Name: [name], [name] ?Date of Service: 10/03/2021 11:15 AM ?Medical Record Number: [id number] ?Patient Account Number: [id number] ?Date of Birth/Sex: June 04, 1956 (66 y.o. F) ?Treating RN: [name] Dredge ?Primary Care Kerigan Narvaez: Ranae Plumber Other Clinician: ?Referring Adolph Clutter: Ranae Plumber ?Treating Antania Hoefling/Extender: Jeri Cos ?Weeks in Treatment: 9 ?Edema Assessment ?Assessed: [Left: No] [Right: No] ?Edema: [Left: Yes] [Right: Yes] ?Calf ?Left: Right: ?Point of Measurement: 39 cm From Medial Instep 94 cm 94 cm ?Ankle ?Left: Right: ?Point of Measurement: 9 cm From Medial Instep 53 cm 54.3 cm ?Vascular Assessment ?Pulses: ?Dorsalis Pedis ?Palpable: [Left:Yes] [Right:Yes] ?Electronic Signature(s) ?Signed: 10/03/2021 4:01:08 PM By: [name] Dredge ?Entered By: [name] Dredge on 10/03/2021 11:37:44 ?[name], [name] ([id number]) ?-------------------------------------------------------------------------------- ?Multi Wound Chart Details ?Patient Name: [name], [name] ?Date of Service: 10/03/2021 11:15 AM ?Medical Record Number: [id number] ?Patient Account Number: [id number] ?Date of Birth/Sex: 03-17-1956 (66 y.o. F) ?Treating RN: [name] Dredge ?Primary Care Yazmine Sorey: Ranae Plumber Other Clinician: ?Referring Edi Gorniak: Ranae Plumber ?Treating Elmarie Devlin/Extender: Jeri Cos ?Weeks in Treatment: 9 ?Vital Signs ?Height(in): 63 ?Pulse(bpm): 71 ?Weight(lbs):  372 ?Blood Pressure(mmHg): 145/84 ?Body Mass Index(BMI): 65.9 ?Temperature(??F): 98.3 ?Respiratory Rate(breaths/min): 18 ?Photos: [N/A:N/A] ?Wound Location: Right Lower Leg N/A N/A ?Wounding Event: Blister N/A N/A

## 2021-10-03 NOTE — Progress Notes (Signed)
BP 115/62, HR 116 at 2106 taken right after the patient transferred from ED bed to 1C bed. BP 137/61, HR 57 at 2111. Patient is not experiencing any acute distress. VS protocol initiated. ?

## 2021-10-03 NOTE — H&P (Signed)
?History and Physical  ? ? ?[name] ZOX:[id number] DOB: 1955/10/06 DOA: 10/03/2021 ? ?Referring MD/NP/PA:  ? ?PCP: Ranae Plumber, [place]  ? ?Patient coming from:  The patient is coming from home.  At baseline, pt is independent for most of ADL.       ? ?Chief Complaint: Worsening left leg swelling and redness ? ?HPI: [name] is a 66 y.o. female with medical history significant of hypertension, asthma, OSA on CPAP, chronic bilateral lymphedema, recurrent lower extremity cellulitis, ".06, morbid obesity with BMI 64.34, former smoker, CKD-3A, dCHF, who presents with worsening left leg swelling and redness. ? ?Patient states that she has chronic bilateral leg lymphedema.  She has recurrent cellulitis in legs.  Not long ago she had right leg cellulitis which has resolved.  In the past 3 weeks, she has worsening left leg swelling and redness.  The swelling and redness have been spreading up from ankle to knee level. Patient has mild pain, no fever or chills.  Patient has a mild dry cough, no shortness breath, chest pain.  Denies nausea, vomiting, diarrhea or abdominal pain.  No symptoms of UTI.  Patient has bee seen by wound care since January. ? ?Data Reviewed and ED Course: pt was found to have WBC 5.2, lactic acid 1.7, negative urinalysis, renal function close to baseline, temperature normal, blood pressure 147/112, heart rate 73, RR 20, oxygen saturation 94% on room air.  Patient is admitted to [facility] bed as inpatient ? ? ?EKG:  Not done in ED, will get one.    ? ?Review of Systems:  ? ?General: no fevers, chills, no body weight gain, has poor appetite, has fatigue ?HEENT: no blurry vision, hearing changes or sore throat ?Respiratory: no dyspnea, coughing, wheezing ?CV: no chest pain, no palpitations ?GI: no nausea, vomiting, abdominal pain, diarrhea, constipation ?GU: no dysuria, burning on urination, increased urinary frequency, hematuria  ?Ext: Patient has severe bilateral leg lymphedema, left  leg is worse with erythema ?Neuro: no unilateral weakness, numbness, or tingling, no vision change or hearing loss ?Skin: no rash, no skin tear. ?MSK: No muscle spasm, no deformity, no limitation of range of movement in spin ?Heme: No easy bruising.  ?Travel history: No recent long distant travel. ? ? ?Allergy:  ?Allergies  ?Allergen Reactions  ? Ace Inhibitors Hives and Swelling  ? Ibuprofen Hives and Swelling  ? Vancomycin Itching and Nausea And Vomiting  ? ? ?Past Medical History:  ?Diagnosis Date  ? Arthritis   ? Asthma   ? Enlarged heart   ? Hypertension   ? Lymphedema   ? Sleep apnea   ? Spinal stenosis   ? ? ?Past Surgical History:  ?Procedure Laterality Date  ? COLONOSCOPY WITH PROPOFOL N/A 03/12/2018  ? Procedure: COLONOSCOPY WITH PROPOFOL;  Surgeon: Manya Silvas, MD;  Location: [facility];  Service: Endoscopy;  Laterality: N/A;  ? NO PAST SURGERIES    ? ? ?Social History:  reports that she quit smoking about 31 years ago. She started smoking about 45 years ago. She smoked an average of .25 packs per day. Her smokeless tobacco use includes snuff. She reports that she does not drink alcohol and does not use drugs. ? ?Family History:  ?Family History  ?Problem Relation Age of Onset  ? Thyroid disease Other   ? Breast cancer Maternal Aunt   ?  ? ?Prior to Admission medications   ?Medication Sig Start Date End Date Taking? Authorizing Provider  ?acetaminophen (TYLENOL) 500 MG tablet  Take 500-1,000 mg by mouth every 6 (six) hours as needed for mild pain or fever.    [provider]  ?atenolol (TENORMIN) 50 MG tablet Take 50 mg by mouth daily. 02/29/20   [provider]  ?cetirizine (ZYRTEC) 10 MG tablet Take 1 tablet (10 mg total) by mouth daily. 12/25/20   Lavina Hamman, MD  ?furosemide (LASIX) 20 MG tablet Take 1 tablet (20 mg total) by mouth daily. 06/07/19   Loletha Grayer, MD  ?methocarbamol (ROBAXIN) 500 MG tablet Take 1 tablet (500 mg total) by mouth every 8 (eight) hours as  needed for muscle spasms. 01/21/21   Lavina Hamman, MD  ? ? ?Physical Exam: ?Vitals:  ? 10/03/21 1300 10/03/21 1301  ?BP:  (!) 147/112  ?Pulse:  73  ?Resp:  20  ?Temp:  98 ?F (36.7 ?C)  ?TempSrc:  Oral  ?SpO2:  94%  ?Weight: (!) 167.4 kg   ?Height: 5' 3.5" (1.613 m)   ? ?General: Not in acute distress ?HEENT: ?      Eyes: PERRL, EOMI, no scleral icterus. ?      ENT: No discharge from the ears and nose, no pharynx injection, no tonsillar enlargement.  ?      Neck: Difficult to assess JVD due to morbid obesity, no bruit, no mass felt. ?Heme: No neck lymph node enlargement. ?Cardiac: S1/S2, RRR, No murmurs, No gallops or rubs. ?Respiratory: No rales, wheezing, rhonchi or rubs. ?GI: Soft, nondistended, nontender, no rebound pain, no organomegaly, BS present. ?GU: No hematuria ?Ext: Has severe chronic lymphedema in both legs, left leg is worse with erythema. ? ? ? ? ?Musculoskeletal: No joint deformities, No joint redness or warmth, no limitation of ROM in spin. ?Skin: No rashes.  ?Neuro: Alert, oriented X3, cranial nerves II-XII grossly intact, moves all extremities normally. ?Psych: Patient is not psychotic, no suicidal or hemocidal ideation. ? ?Labs on Admission: I have personally reviewed following labs and imaging studies ? ?CBC: ?Recent Labs  ?Lab 10/03/21 ?1301  ?WBC 5.2  ?NEUTROABS 2.7  ?HGB 12.9  ?HCT 42.9  ?MCV 85.1  ?PLT 199  ? ?Basic Metabolic Panel: ?Recent Labs  ?Lab 10/03/21 ?1301  ?NA 136  ?K 4.2  ?CL 100  ?CO2 26  ?GLUCOSE 90  ?BUN 19  ?CREATININE 1.12*  ?CALCIUM 9.0  ? ?GFR: ?Estimated Creatinine Clearance: 77.3 mL/min (A) (by C-G formula based on SCr of 1.12 mg/dL (H)). ?Liver Function Tests: ?Recent Labs  ?Lab 10/03/21 ?1301  ?AST 22  ?ALT 15  ?ALKPHOS 144*  ?BILITOT 0.8  ?PROT 8.7*  ?ALBUMIN 3.7  ? ?No results for input(s): LIPASE, AMYLASE in the last 168 hours. ?No results for input(s): AMMONIA in the last 168 hours. ?Coagulation Profile: ?No results for input(s): INR, PROTIME in the last 168  hours. ?Cardiac Enzymes: ?No results for input(s): CKTOTAL, CKMB, CKMBINDEX, TROPONINI in the last 168 hours. ?BNP (last 3 results) ?No results for input(s): PROBNP in the last 8760 hours. ?HbA1C: ?No results for input(s): HGBA1C in the last 72 hours. ?CBG: ?No results for input(s): GLUCAP in the last 168 hours. ?Lipid Profile: ?No results for input(s): CHOL, HDL, LDLCALC, TRIG, CHOLHDL, LDLDIRECT in the last 72 hours. ?Thyroid Function Tests: ?No results for input(s): TSH, T4TOTAL, FREET4, T3FREE, THYROIDAB in the last 72 hours. ?Anemia Panel: ?No results for input(s): VITAMINB12, FOLATE, FERRITIN, TIBC, IRON, RETICCTPCT in the last 72 hours. ?Urine analysis: ?   ?Component Value Date/Time  ? COLORURINE YELLOW (A) 10/03/2021 1255  ?  APPEARANCEUR HAZY (A) 10/03/2021 1255  ? APPEARANCEUR Clear 05/18/2013 1725  ? LABSPEC 1.024 10/03/2021 1255  ? LABSPEC 1.011 05/18/2013 1725  ? PHURINE 5.0 10/03/2021 1255  ? GLUCOSEU NEGATIVE 10/03/2021 1255  ? GLUCOSEU Negative 05/18/2013 1725  ? [place] NEGATIVE 10/03/2021 1255  ? [place] NEGATIVE 10/03/2021 1255  ? BILIRUBINUR Negative 05/18/2013 1725  ? [place] NEGATIVE 10/03/2021 1255  ? PROTEINUR 30 (A) 10/03/2021 1255  ? NITRITE NEGATIVE 10/03/2021 1255  ? LEUKOCYTESUR NEGATIVE 10/03/2021 1255  ? LEUKOCYTESUR Negative 05/18/2013 1725  ? ?Sepsis Labs: ?'@LABRCNTIP'$ (procalcitonin:4,lacticidven:4) ?) ?Recent Results (from the past 240 hour(s))  ?Aerobic Culture w Gram Stain (superficial specimen)     Status: None  ? Collection Time: 09/26/21 12:08 PM  ? Specimen: Wound  ?Result Value Ref Range Status  ? Specimen Description   Final  ?  WOUND ?Performed at [facility], [address], [place], [place] [address] ?  ? Special Requests   Final  ?  LEFT LEG ?Performed at [facility], [address], [place], [place] [address] ?  ? Gram Stain   Final  ?  RARE WBC PRESENT, PREDOMINANTLY MONONUCLEAR ?NO ORGANISMS SEEN ?Performed at [facility], [place]  [address], [place], [place] [address] ?  ? Culture   Final  ?  FEW ENTEROCOCCUS FAECALIS ?RARE ALCALIGENES FAECALIS ?  ? Report Status 09/30/2021 FINAL  Final  ? Organism ID, Bacteria ENTEROCOCCUS FAECALIS  Final  ? O

## 2021-10-03 NOTE — ED Provider Notes (Signed)
? ?[facility] ?Provider Note ? ? ? Event Date/Time  ? First MD Initiated Contact with Patient 10/03/21 1356   ?  (approximate) ? ? ?History  ? ?Cellulitis ? ? ?HPI ? ?Horace Wishon is a 66 y.o. female with a history of lymphedema, hypertension, CHF who presents with complaints of left ower extremity swelling and redness.  Patient reports she has been at the wound care center and has been treated with Bactrim.  Was supposed to start Augmentin for worsening redness but there was an error at the pharmacy.  Redness has now spread up her leg.  She was referred from wound care center to the emergency department ?  ? ? ?Physical Exam  ? ?Triage Vital Signs: ?ED Triage Vitals  ?Enc Vitals Group  ?   BP 10/03/21 1301 (!) 147/112  ?   Pulse Rate 10/03/21 1301 73  ?   Resp 10/03/21 1301 20  ?   Temp 10/03/21 1301 98 ?F (36.7 ?C)  ?   Temp Source 10/03/21 1301 Oral  ?   SpO2 10/03/21 1301 94 %  ?   Weight 10/03/21 1300 (!) 167.4 kg (369 lb)  ?   Height 10/03/21 1300 1.613 m (5' 3.5")  ?   Head Circumference --   ?   Peak Flow --   ?   Pain Score --   ?   Pain Loc --   ?   Pain Edu? --   ?   Excl. in [place]? --   ? ? ?Most recent vital signs: ?Vitals:  ? 10/03/21 1301  ?BP: (!) 147/112  ?Pulse: 73  ?Resp: 20  ?Temp: 98 ?F (36.7 ?C)  ?SpO2: 94%  ? ? ? ?General: Awake, no distress.  ?CV:  Good peripheral perfusion.  ?Resp:  Normal effort.  ?Abd:  No distention.  ?Other:  Bilateral lymphedema, left leg, shallow ulceration, erythema extending up to the mid lower leg ? ? ?ED Results / Procedures / Treatments  ? ?Labs ?(all labs ordered are listed, but only abnormal results are displayed) ?Labs Reviewed  ?COMPREHENSIVE METABOLIC PANEL - Abnormal; Notable for the following components:  ?    Result Value  ? Creatinine, Ser 1.12 (*)   ? Total Protein 8.7 (*)   ? Alkaline Phosphatase 144 (*)   ? GFR, Estimated 54 (*)   ? All other components within normal limits  ?CBC WITH DIFFERENTIAL/PLATELET - Abnormal; Notable  for the following components:  ? MCH 25.6 (*)   ? All other components within normal limits  ?URINALYSIS, ROUTINE W REFLEX MICROSCOPIC - Abnormal; Notable for the following components:  ? Color, Urine YELLOW (*)   ? APPearance HAZY (*)   ? Protein, ur 30 (*)   ? All other components within normal limits  ?CULTURE, BLOOD (ROUTINE X 2)  ?CULTURE, BLOOD (ROUTINE X 2)  ?LACTIC ACID, PLASMA  ?SEDIMENTATION RATE  ?C-REACTIVE PROTEIN  ? ? ? ?EKG ? ? ? ? ?RADIOLOGY ? ? ? ? ?PROCEDURES: ? ?Critical Care performed:  ? ?Procedures ? ? ?MEDICATIONS ORDERED IN ED: ?Medications  ?cefTRIAXone (ROCEPHIN) 2 g in sodium chloride 0.9 % 100 mL IVPB (has no administration in time range)  ?ondansetron (ZOFRAN) injection 4 mg (has no administration in time range)  ?acetaminophen (TYLENOL) tablet 650 mg (has no administration in time range)  ?hydrALAZINE (APRESOLINE) injection 5 mg (has no administration in time range)  ?albuterol (PROVENTIL) (2.5 MG/3ML) 0.083% nebulizer solution 3 mL (has no administration in time  range)  ?enoxaparin (LOVENOX) injection 82.5 mg (has no administration in time range)  ? ? ? ?IMPRESSION / MDM / ASSESSMENT AND PLAN / ED COURSE  ?I reviewed the triage vital signs and the nursing notes. ? ? ? ?Patient presents with erythema redness worsening over the last several days in the setting of lymphedema.  She reports she has had multiple episodes of cellulitis in the past.  Typically requires IV antibiotics.  Has been on outpatient antibiotics with little improvement. ? ?Lab work today is overall reassuring, lactic acid is normal, white blood cell count is normal. ? ?Given her history and multiple episodes of outpatient failure of antibiotics we will start the patient on IV Rocephin and admit to the hospitalist service ? ? ? ?  ? ? ?FINAL CLINICAL IMPRESSION(S) / ED DIAGNOSES  ? ?Final diagnoses:  ?Cellulitis of left lower extremity  ? ? ? ?Rx / DC Orders  ? ?ED Discharge Orders   ? ? None  ? ?  ? ? ? ?Note:  This  document was prepared using Dragon voice recognition software and may include unintentional dictation errors. ?  ?Lavonia Drafts, MD ?10/03/21 1424 ? ?

## 2021-10-03 NOTE — ED Triage Notes (Signed)
Pt was sent from the wound care center with cellulitis of the LLE, states she has been seeing them for this issues since mid jan, states she had redness around to the ankle and they sent in abx on Friday but it did not transmit and today the redness is up to the knee.  ?

## 2021-10-04 DIAGNOSIS — I5032 Chronic diastolic (congestive) heart failure: Secondary | ICD-10-CM | POA: Diagnosis not present

## 2021-10-04 DIAGNOSIS — N1831 Chronic kidney disease, stage 3a: Secondary | ICD-10-CM | POA: Diagnosis not present

## 2021-10-04 DIAGNOSIS — I890 Lymphedema, not elsewhere classified: Secondary | ICD-10-CM

## 2021-10-04 DIAGNOSIS — L03116 Cellulitis of left lower limb: Secondary | ICD-10-CM | POA: Diagnosis not present

## 2021-10-04 LAB — BASIC METABOLIC PANEL
Anion gap: 6 (ref 5–15)
BUN: 19 mg/dL (ref 8–23)
CO2: 28 mmol/L (ref 22–32)
Calcium: 8.1 mg/dL — ABNORMAL LOW (ref 8.9–10.3)
Chloride: 102 mmol/L (ref 98–111)
Creatinine, Ser: 0.98 mg/dL (ref 0.44–1.00)
GFR, Estimated: >60 mL/min (ref >60)
Glucose, Bld: 97 mg/dL (ref 70–99)
Potassium: 4.3 mmol/L (ref 3.5–5.1)
Sodium: 136 mmol/L (ref 135–145)

## 2021-10-04 LAB — CBC
HCT: 35.7 % — ABNORMAL LOW (ref 36.0–46.0)
Hemoglobin: 10.9 g/dL — ABNORMAL LOW (ref 12.0–15.0)
MCH: 25.8 pg — ABNORMAL LOW (ref 26.0–34.0)
MCHC: 30.5 g/dL (ref 30.0–36.0)
MCV: 84.4 fL (ref 80.0–100.0)
Platelets: 175 10*3/uL (ref 150–400)
RBC: 4.23 MIL/uL (ref 3.87–5.11)
RDW: 14.7 % (ref 11.5–15.5)
WBC: 5 10*3/uL (ref 4.0–10.5)
nRBC: 0 % (ref 0.0–0.2)

## 2021-10-04 MED ORDER — PIPERACILLIN-TAZOBACTAM 3.375 G IVPB
3.3750 g | Freq: Three times a day (TID) | INTRAVENOUS | Status: DC
  Administered 2021-10-04 – 2021-10-08 (×12): 3.375 g via INTRAVENOUS
  Filled 2021-10-04 (×12): qty 50

## 2021-10-04 NOTE — Assessment & Plan Note (Addendum)
Patient on Tenormin and Lasix.  Today's weight 367.18 with a BMI of 64.16. ?

## 2021-10-04 NOTE — Progress Notes (Signed)
Mobility Specialist - Progress Note ? ? 10/04/21 1100  ?Mobility  ?Activity Transferred from bed to chair  ?Level of Assistance Standby assist, set-up cues, supervision of patient - no hands on  ?Assistive Device Front wheel walker ?(HHA)  ?Activity Response Tolerated well  ?$Mobility charge 1 Mobility  ? ? ? ?Pt lying in bed upon arrival, utilizing RA. Pt was able to reach EOB with supervision---UE support onto LE to swing legs out of bed with use of looped bed sheet. HHA to stand from standard bed height. Pt SPT to recliner with alarm set, needs in reach. RN notified.  ? ? ?Kathee Delton ?Mobility Specialist ?10/04/21, 11:33 AM ? ? ? ? ?

## 2021-10-04 NOTE — Evaluation (Signed)
Physical Therapy Evaluation ?Patient Details ?Name: [name] ?MRN: [id number] ?DOB: [birth year] ?Today's Date: 10/04/2021 ? ?History of Present Illness ? [name] is a 66 y.o. female with medical history significant of hypertension, asthma, OSA on CPAP, chronic bilateral lymphedema, recurrent lower extremity cellulitis, ".06, morbid obesity with BMI 64.34, former smoker, CKD-3A, dCHF, who presents with worsening left leg swelling and redness. ?  ?Clinical Impression ? Pt admitted with above diagnosis. Pt received supine in bed agreeable to PT services. Pt denies pain and reporting she is Mod-I at baseline with AD whether that is QC or rollator. Pt reports she sleeps in hospital bed at baseline and relies on momentum and sheet for LE management for bed mobility and transfers. Pt demonstrates mod-I ability to attain sitting EoB with HOB elevated and using bed sheet to mobilize LE's to EOB with bouts of momentum to bring torso to upright sitting. With further bouts of momentum pt can stand with QC in RUE and bed rail in LUE with supervision and ambulate in room with intermittent need for UE on walls/surfaces for stability with minguard from PT but no LOB noted with antalgic gait pattern/step to gait with wide BOS present. Noted incorrect use of quad cane throughout session thus pt educated on proper side QC should be on depending on prong orientation with education provided on how to change prongs if needed from R hand to L hand use. Pt verbalizing understanding with return to supine in bed mod-I. Assist needed for pillows placing under knees. All needs in reach. Will continue to follow acutely for proper gait training with BRW versus QC to reduced falls risk. Pt currently with functional limitations due to the deficits listed below (see PT Problem List). Pt will benefit from skilled PT to increase their independence and safety with mobility to allow discharge to the venue listed below.       ?    ? ?Recommendations for follow up therapy are one component of a multi-disciplinary discharge planning process, led by the attending physician.  Recommendations may be updated based on patient status, additional functional criteria and insurance authorization. ? ?Follow Up Recommendations Home health PT ? ?  ?Assistance Recommended at Discharge None  ?Patient can return home with the following ? Assistance with cooking/housework;A little help with walking and/or transfers;A little help with bathing/dressing/bathroom;Help with stairs or ramp for entrance ? ?  ?Equipment Recommendations None recommended by PT  ?Recommendations for Other Services ?    ?  ?Functional Status Assessment Patient has had a recent decline in their functional status and demonstrates the ability to make significant improvements in function in a reasonable and predictable amount of time.  ? ?  ?Precautions / Restrictions Precautions ?Precautions: Fall ?Restrictions ?Weight Bearing Restrictions: No  ? ?  ? ?Mobility ? Bed Mobility ?Overal bed mobility: Modified Independent ?  ?  ?  ?  ?  ?  ?General bed mobility comments: Has hospital bed at home, uses bed features and sheet to assist in LE management. ?Patient Response: Cooperative ? ?Transfers ?Overall transfer level: Needs assistance ?Equipment used: Quad cane ?Transfers: Sit to/from Stand ?Sit to Stand: Supervision ?  ?  ?  ?  ?  ?General transfer comment: Use of bouts of momentum to stand with UE on bed rail and UE on QC ?  ? ?Ambulation/Gait ?Ambulation/Gait assistance: Supervision ?Gait Distance (Feet): 25 Feet ?Assistive device: Quad cane ?Gait Pattern/deviations: Step-to pattern, Wide base of support ?  ?  ?  ?  General Gait Details: Intermittent need of SUE on walls/surfaces for support but remains steady with QC within room ambulation ? ?Stairs ?  ?  ?  ?  ?  ? ?Wheelchair Mobility ?  ? ?Modified Rankin (Stroke Patients Only) ?  ? ?  ? ?Balance Overall balance assessment: Needs  assistance ?Sitting-balance support: Feet supported, Bilateral upper extremity supported ?Sitting balance-Leahy Scale: Fair ?  ?  ?  ?Standing balance-Leahy Scale: Fair ?  ?  ?  ?  ?  ?  ?  ?  ?  ?  ?  ?  ?   ? ? ? ?Pertinent Vitals/Pain Pain Assessment ?Pain Assessment: No/denies pain  ? ? ?Home Living Family/patient expects to be discharged to:: Private residence ?Living Arrangements: Children ?Available Help at Discharge: Family;Available PRN/intermittently ?Type of Home: House ?Home Access: Stairs to enter ?Entrance Stairs-Rails: Right ?Entrance Stairs-Number of Steps: 1 ?  ?Home Layout: One level ?Home Equipment: Cane - quad;BSC/3in1;Grab bars - tub/shower;Hand held shower head;Grab bars - toilet;Hospital bed;Toilet riser;Rollator (4 wheels) ?Additional Comments: Lift chair/recliner  ?  ?Prior Function Prior Level of Function : Independent/Modified Independent ?  ?  ?  ?  ?  ?  ?Mobility Comments: with QC and occasional Rollator ?  ?  ? ? ?Hand Dominance  ? Dominant Hand: Right ? ?  ?Extremity/Trunk Assessment  ? Upper Extremity Assessment ?Upper Extremity Assessment: Overall WFL for tasks assessed ?  ? ?Lower Extremity Assessment ?Lower Extremity Assessment: Overall WFL for tasks assessed ?  ? ?   ?Communication  ? Communication: No difficulties  ?Cognition Arousal/Alertness: Awake/alert ?Behavior During Therapy: [facility] for tasks assessed/performed ?Overall Cognitive Status: Within Functional Limits for tasks assessed ?  ?  ?  ?  ?  ?  ?  ?  ?  ?  ?  ?  ?  ?  ?  ?  ?General Comments: Very pleasant and cooperative. Receptive to PT education. ?  ?  ? ?  ?General Comments   ? ?  ?Exercises Other Exercises ?Other Exercises: Role of PT in acute setting, correct use of QC and adjustment, D/c recs.  ? ?Assessment/Plan  ?  ?PT Assessment Patient needs continued PT services  ?PT Problem List Decreased strength;Decreased activity tolerance;Decreased balance;Decreased knowledge of use of DME ? ?   ?  ?PT Treatment  Interventions DME instruction;Therapeutic exercise;Gait training;Balance training;Stair training;Neuromuscular re-education;Functional mobility training;Therapeutic activities;Patient/family education   ? ?PT Goals (Current goals can be found in the Care Plan section)  ?Acute Rehab PT Goals ?Patient Stated Goal: to go home ?PT Goal Formulation: With patient ?Time For Goal Achievement: 10/18/21 ?Potential to Achieve Goals: Good ? ?  ?Frequency Min 2X/week ?  ? ? ?Co-evaluation   ?  ?  ?  ?  ? ? ?  ?AM-PAC PT "6 Clicks" Mobility  ?Outcome Measure Help needed turning from your back to your side while in a flat bed without using bedrails?: A Lot ?Help needed moving from lying on your back to sitting on the side of a flat bed without using bedrails?: A Lot ?Help needed moving to and from a bed to a chair (including a wheelchair)?: A Little ?Help needed standing up from a chair using your arms (e.g., wheelchair or bedside chair)?: A Little ?Help needed to walk in hospital room?: A Little ?Help needed climbing 3-5 steps with a railing? : A Lot ?6 Click Score: 15 ? ?  ?End of Session Equipment Utilized During Treatment: Gait belt ?Activity Tolerance: Patient  tolerated treatment well ?Patient left: in bed;with call bell/phone within reach;with bed alarm set ?Nurse Communication: Mobility status ?PT Visit Diagnosis: Unsteadiness on feet (R26.81);Other abnormalities of gait and mobility (R26.89);Difficulty in walking, not elsewhere classified (R26.2) ?  ? ?Time: 3612-2449 ?PT Time Calculation (min) (ACUTE ONLY): 20 min ? ? ?Charges:   PT Evaluation ?$PT Eval Moderate Complexity: 1 Mod ?  ?  ?   ? ? ?Salem Caster. Fairly IV, PT, DPT ?Physical Therapist- [place]  ?[facility]  ?10/04/2021, 4:05 PM ? ?

## 2021-10-04 NOTE — Assessment & Plan Note (Addendum)
Continue atenolol, Lasix ?

## 2021-10-04 NOTE — Assessment & Plan Note (Addendum)
Creatinine 1.11 yesterday with a GFR greater than 55.  Recommend checking BMP and follow-up appointment ?

## 2021-10-04 NOTE — Consult Note (Signed)
WOC Nurse Consult Note: ?Reason for Consult:Lymphedema with cellulitis and nonintact wounds to left lower leg.  ?Wound type:inflammatory, infectious ?Pressure Injury POA: NA ?Measurement:LEft anterior lower leg:  4 cm x 3 cm x 0.2 cm  ?Left posterior leg:  1 cm intact blister ?Left lower leg: above foot in crease:  2 cm x 2.5 cm  ?Wound ZOX:WRUE pink moist ?Drainage (amount, consistency, odor) moderate serous drainage  no odor ?Periwound:generalized edema to lower legs from hip to foot.  ?Chronic skin changes, erythema, lichenification to lower legs bilaterally ?Right lower leg intact with erythema and edema ?Patient has no pain in lower legs ?Patient was prescribed lymphedema pumps for home but refuses to wear them now as she was diagnosed with heart failure (diastolic).   We discuss diastolic vs systolic heart failure and the fact that compression is needed to avoid increased edema and blistering and manage excess fluid.  She will only agree to ace wraps and only to the level below her knee.  ?Dressing procedure/placement/frequency: Bedside RN to perform:  Cleanse bilateral lower legs with soap and water and pat dry.  Apply Aquacel silver hydrofiber to open wounds on left lower leg .  Wrap bilateral legs with kerlix and ace wraps.  Change twice weekly on Wednesday and Sunday.  ?Conservative sharp wound debridement (CSWD performed at the bedside): ? ? ?  ?

## 2021-10-04 NOTE — Assessment & Plan Note (Addendum)
Patient on CPAP at night while here ?

## 2021-10-04 NOTE — Assessment & Plan Note (Addendum)
Patient interested in taking medications once a day. I switched her Lasix over to torsemide 20 mg daily. ?

## 2021-10-04 NOTE — Assessment & Plan Note (Addendum)
Last BMI 64.16 ?

## 2021-10-04 NOTE — Assessment & Plan Note (Addendum)
Failed outpatient treatment with Bactrim (took 12 days at home).  Cellulitis progressed despite being treated with Bactrim.  A new anterior ulcer also developed.  Wound culture from last week showing Enterococcus faecalis and Alcaligenes faecalis.  Continue Zosyn while here.  Upper leg starting to fade with the redness.  I switched over to Augmentin and Bactrim upon discharge. ?

## 2021-10-04 NOTE — Progress Notes (Signed)
?Progress Note ? ? ?Patient: [name] JHE:[id number] DOB: February 04, 1956 DOA: 10/03/2021     1 ?DOS: the patient was seen and examined on 10/04/2021 ?  ? ? ?Assessment and Plan: ?* Cellulitis of left lower extremity ?Failed outpatient treatment with Bactrim (took 12 days at home).  Cellulitis progressed despite being treated with Bactrim.  A new anterior ulcer also developed.  Patient stated that the wound care center was supposed to call in some Augmentin for her also but it was not at the pharmacy.  Wound culture from last week showing Enterococcus faecalis and Alcaligenes faecalis.  Case discussed with infectious disease pharmacist and antibiotics switched over to Zosyn.  With worsening cellulitis I believe the patient needs IV antibiotics at this point in time.  The Rocephin that was started yesterday would not cover these organisms.  The patient stated that she did get a lot of itching with the Bactrim. ? ?Chronic kidney disease, stage 3a ([place]) ?Today's creatinine actually better at 0.98 with a GFR greater than 60.  When she came in GFR was 54 ? ?Lymphedema ?Continue Lasix 40 mg twice a day. ? ?Chronic diastolic CHF (congestive heart failure) ([place]) ?Patient on Tenormin and Lasix. ? ?Morbid obesity with BMI of 60.0-69.9, adult ([place]) ?Last BMI 64.34. ? ?OSA on CPAP ?Continue CPAP ? ?Essential hypertension ?Continue atenolol Lasix ? ? ? ? ?  ? ?Subjective: Patient states that she was on 12 days of Bactrim prior to coming in.  Developed a lot of itching.  She stated the wound care center was supposed to call in some Augmentin also for her but it was not at the pharmacy.  Had worsening redness and ended up in the hospital. ? ?Physical Exam: ?Vitals:  ? 10/04/21 0116 10/04/21 0451 10/04/21 0603 10/04/21 0731  ?BP: 135/68 (!) 100/49 (!) 104/59 (!) 98/54  ?Pulse: (!) 58 64 63 63  ?Resp:  18  20  ?Temp: 97.8 ?F (36.6 ?C) 98.2 ?F (36.8 ?C)  98.1 ?F (36.7 ?C)  ?TempSrc:  Oral  Oral  ?SpO2: 100% 100% 97% 97%  ?Weight:       ?Height:      ? ?Physical Exam ?HENT:  ?   Head: Normocephalic.  ?   Mouth/Throat:  ?   Pharynx: No oropharyngeal exudate.  ?Eyes:  ?   General: Lids are normal.  ?   Conjunctiva/sclera: Conjunctivae normal.  ?Cardiovascular:  ?   Rate and Rhythm: Normal rate and regular rhythm.  ?   Heart sounds: Normal heart sounds, S1 normal and S2 normal.  ?Pulmonary:  ?   Breath sounds: Examination of the right-lower field reveals decreased breath sounds. Examination of the left-lower field reveals decreased breath sounds. Decreased breath sounds present. No wheezing, rhonchi or rales.  ?Abdominal:  ?   Palpations: Abdomen is soft.  ?   Tenderness: There is no abdominal tenderness.  ?Musculoskeletal:  ?   Right upper leg: Swelling present.  ?   Left upper leg: Swelling present.  ?   Right lower leg: Swelling present.  ?   Left lower leg: Swelling present.  ?Skin: ?   Comments: See picture below  ?Neurological:  ?   Mental Status: She is alert and oriented to person, place, and time.  ?  ? ? ? ?Data Reviewed: ?Laboratory radiological data reviewed.  Creatinine 0.98 today, white blood cell count 5.0.  Hemoglobin 10.9, sedimentation rate 63.  Microbiology from last week reviewed. ? ? ?Disposition: ?Status is: Inpatient ?Remains inpatient appropriate  because: Adjustment in antibiotics to cover the organisms in the wound culture from last week.  Requires IV antibiotics.  Reassess tomorrow. ? ?Planned Discharge Destination: Home ? ?Case discussed with pharmacy team. ? ?Author: ?Loletha Grayer, MD ?10/04/2021 1:03 PM ? ?For on call review [email or web address].  ?

## 2021-10-05 DIAGNOSIS — D638 Anemia in other chronic diseases classified elsewhere: Secondary | ICD-10-CM

## 2021-10-05 DIAGNOSIS — L03116 Cellulitis of left lower limb: Secondary | ICD-10-CM | POA: Diagnosis not present

## 2021-10-05 DIAGNOSIS — G629 Polyneuropathy, unspecified: Secondary | ICD-10-CM

## 2021-10-05 DIAGNOSIS — I890 Lymphedema, not elsewhere classified: Secondary | ICD-10-CM | POA: Diagnosis not present

## 2021-10-05 DIAGNOSIS — N1831 Chronic kidney disease, stage 3a: Secondary | ICD-10-CM | POA: Diagnosis not present

## 2021-10-05 MED ORDER — PREGABALIN 50 MG PO CAPS
50.0000 mg | ORAL_CAPSULE | Freq: Every day | ORAL | Status: DC
  Administered 2021-10-05 – 2021-10-06 (×2): 50 mg via ORAL
  Filled 2021-10-05 (×2): qty 1

## 2021-10-05 NOTE — Progress Notes (Signed)
Physical Therapy Treatment ?Patient Details ?Name: [name] ?MRN: [id number] ?DOB: [birth year] ?Today's Date: 10/05/2021 ? ? ?History of Present Illness [name] is a 66 y.o. female with medical history significant of hypertension, asthma, OSA on CPAP, chronic bilateral lymphedema, recurrent lower extremity cellulitis, ".06, morbid obesity with BMI 64.34, former smoker, CKD-3A, dCHF, who presents with worsening left leg swelling and redness. ? ?  ?PT Comments  ? ? Pt received upright in bed agreeable to PT services. Pt remains mod-I with bed mobility and STS transfers with bed features and use of bed sheet for LE management. Use of BRW today for improved safety and tolerance for OOB mobility to decrease falls risk with pt tolerating 100' with supervision without unsteadiness compared to QC yesterday. Pt demonstrating improved step lengths with BRW beginning to initiate almost/modified step through pattern compared to step to yesterday. Pt endorsing need for BM with pt being independent in toilet transfers, perihygiene and hand hygiene. Pt returned to recliner with all needs in reach. Will continue to follow acutely for gait impairments and tolerance for OOB mobility with safe use of DME to prevent falls risk in prep for return to home environment. ?   ?Recommendations for follow up therapy are one component of a multi-disciplinary discharge planning process, led by the attending physician.  Recommendations may be updated based on patient status, additional functional criteria and insurance authorization. ? ?Follow Up Recommendations ? Home health PT ?  ?  ?Assistance Recommended at Discharge None  ?Patient can return home with the following Assistance with cooking/housework;A little help with walking and/or transfers;A little help with bathing/dressing/bathroom;Help with stairs or ramp for entrance ?  ?Equipment Recommendations ? None recommended by PT  ?  ?Recommendations for Other Services    ? ? ?  ?Precautions / Restrictions Precautions ?Precautions: Fall ?Restrictions ?Weight Bearing Restrictions: No  ?  ? ?Mobility ? Bed Mobility ?Overal bed mobility: Modified Independent ?  ?  ?  ?  ?  ?  ?General bed mobility comments: Has hospital bed at home, uses bed features and sheet to assist in LE management. ?Patient Response: Cooperative ? ?Transfers ?Overall transfer level: Needs assistance ?Equipment used: Rolling walker (2 wheels) (bariatric) ?Transfers: Sit to/from Stand ?Sit to Stand: Supervision ?  ?  ?  ?  ?  ?General transfer comment: Use of bouts of momentum to stand with UE on bed rail and UE on BRW ?  ? ?Ambulation/Gait ?Ambulation/Gait assistance: Supervision ?Gait Distance (Feet): 100 Feet ?Assistive device: Rolling walker (2 wheels) (Bariatric) ?Gait Pattern/deviations: Step-to pattern, Wide base of support ?  ?  ?  ?General Gait Details: improved safety, activity tolerance with use of BRW ? ? ?Stairs ?  ?  ?  ?  ?  ? ? ?Wheelchair Mobility ?  ? ?Modified Rankin (Stroke Patients Only) ?  ? ? ?  ?Balance Overall balance assessment: Needs assistance ?Sitting-balance support: Feet supported, Bilateral upper extremity supported ?Sitting balance-Leahy Scale: Good ?  ?  ?  ?Standing balance-Leahy Scale: Good ?Standing balance comment: performing hand and perihygiene in standing with supervision ?  ?  ?  ?  ?  ?  ?  ?  ?  ?  ?  ?  ? ?  ?Cognition Arousal/Alertness: Awake/alert ?Behavior During Therapy: [facility] for tasks assessed/performed ?Overall Cognitive Status: Within Functional Limits for tasks assessed ?  ?  ?  ?  ?  ?  ?  ?  ?  ?  ?  ?  ?  ?  ?  ?  ?  ?  ?  ? ?  ?  Exercises Other Exercises ?Other Exercises: Improvement in safety/mobility with BRW versus QC and how it decreases falls risk ? ?  ?General Comments   ?  ?  ? ?Pertinent Vitals/Pain Pain Assessment ?Pain Assessment: No/denies pain  ? ? ?Home Living   ?  ?  ?  ?  ?  ?  ?  ?  ?  ?   ?  ?Prior Function    ?  ?  ?   ? ?PT Goals (current  goals can now be found in the care plan section) Acute Rehab PT Goals ?Patient Stated Goal: to go home ?PT Goal Formulation: With patient ?Time For Goal Achievement: 10/18/21 ?Potential to Achieve Goals: Good ?Progress towards PT goals: Progressing toward goals ? ?  ?Frequency ? ? ? Min 2X/week ? ? ? ?  ?PT Plan Current plan remains appropriate  ? ? ?Co-evaluation   ?  ?  ?  ?  ? ?  ?AM-PAC PT "6 Clicks" Mobility   ?Outcome Measure ? Help needed turning from your back to your side while in a flat bed without using bedrails?: A Lot ?Help needed moving from lying on your back to sitting on the side of a flat bed without using bedrails?: A Lot ?Help needed moving to and from a bed to a chair (including a wheelchair)?: A Little ?Help needed standing up from a chair using your arms (e.g., wheelchair or bedside chair)?: A Little ?Help needed to walk in hospital room?: A Little ?Help needed climbing 3-5 steps with a railing? : A Lot ?6 Click Score: 15 ? ?  ?End of Session Equipment Utilized During Treatment: Gait belt ?Activity Tolerance: Patient tolerated treatment well ?Patient left: in chair;with call bell/phone within reach ?Nurse Communication: Mobility status ?PT Visit Diagnosis: Unsteadiness on feet (R26.81);Other abnormalities of gait and mobility (R26.89);Difficulty in walking, not elsewhere classified (R26.2) ?  ? ? ?Time: 4503-8882 ?PT Time Calculation (min) (ACUTE ONLY): 34 min ? ?Charges:  $Gait Training: 23-37 mins          ?          ? ?Salem Caster. Fairly IV, PT, DPT ?Physical Therapist- [place]  ?[facility]  ?10/05/2021, 12:28 PM ? ?

## 2021-10-05 NOTE — Progress Notes (Signed)
?Progress Note ? ? ?Patient: [name] KLK:[id number] DOB: 12-28-1955 DOA: 10/03/2021     2 ?DOS: the patient was seen and examined on 10/05/2021 ? ? ?Assessment and Plan: ?* Cellulitis of left lower extremity ?Failed outpatient treatment with Bactrim (took 12 days at home).  Cellulitis progressed despite being treated with Bactrim.  A new anterior ulcer also developed.  Wound culture from last week showing Enterococcus faecalis and Alcaligenes faecalis.  Continue Zosyn.  The patient still has quite a bit of redness of the left lower extremity and warmth.  Would like to see it start to improve prior to disposition. ? ?Chronic kidney disease, stage 3a ([place]) ?Yesterday's creatinine actually better at 0.98 with a GFR greater than 60.  When she came in GFR was 54 ? ?Lymphedema ?Continue Lasix 40 mg twice a day. ? ?Chronic diastolic CHF (congestive heart failure) ([place]) ?Patient on Tenormin and Lasix. ? ?Morbid obesity with BMI of 60.0-69.9, adult ([place]) ?Last BMI 64.34. ? ?OSA on CPAP ?Continue CPAP ? ?Neuropathy ?Trial of Lyrica ? ?Anemia ?With ferritin above 100, likely anemia of chronic disease.  Last hemoglobin 10.9. ? ?Essential hypertension ?Continue atenolol Lasix ? ? ? ? ?  ? ?Subjective: Patient admitted with left lower extremity cellulitis.  She failed outpatient treatment with Bactrim.  Antibiotics switched to Zosyn.  Leg still red as per patient.  Patient depending on how she moves continues large electric type shocks in her arms.  And then repositions himself and the pain will then go away.  She was on gabapentin in the past which did not do well for her.  Interested in trying [place]. ? ?Physical Exam: ?Vitals:  ? 10/04/21 2041 10/05/21 0435 10/05/21 0751 10/05/21 1216  ?BP: 125/67 109/60 (!) 109/59 127/65  ?Pulse: 64 65 79 (!) 57  ?Resp: '20 18 20 17  '$ ?Temp: 98.3 ?F (36.8 ?C) 98.5 ?F (36.9 ?C) 97.7 ?F (36.5 ?C) 98.6 ?F (37 ?C)  ?TempSrc:   Oral   ?SpO2: 97% 99% 97% 95%  ?Weight:      ?Height:       ? ?Physical Exam ?HENT:  ?   Head: Normocephalic.  ?   Mouth/Throat:  ?   Pharynx: No oropharyngeal exudate.  ?Eyes:  ?   General: Lids are normal.  ?   Conjunctiva/sclera: Conjunctivae normal.  ?Cardiovascular:  ?   Rate and Rhythm: Normal rate and regular rhythm.  ?   Heart sounds: Normal heart sounds, S1 normal and S2 normal.  ?Pulmonary:  ?   Breath sounds: Examination of the right-lower field reveals decreased breath sounds. Examination of the left-lower field reveals decreased breath sounds. Decreased breath sounds present. No wheezing, rhonchi or rales.  ?Abdominal:  ?   Palpations: Abdomen is soft.  ?   Tenderness: There is no abdominal tenderness.  ?Musculoskeletal:  ?   Right upper leg: Swelling present.  ?   Left upper leg: Swelling present.  ?   Right lower leg: Swelling present.  ?   Left lower leg: Swelling present.  ?Skin: ?   Comments: Erythema and warmth still on her left lower leg.  The deep red is starting to turn to a lesser red on her lower leg.  Some drainage from ulcer on the front of her shin.  ?Neurological:  ?   Mental Status: She is alert and oriented to person, place, and time.  ?  ?Data Reviewed: ?Last hemoglobin 10.9 ? ?Disposition: ?Status is: Inpatient ?Remains inpatient appropriate because: Requiring IV antibiotics  to treat cellulitis ? ?Planned Discharge Destination: Home and Home with Home Health ? ?Author: ?Loletha Grayer, MD ?10/05/2021 2:45 PM ? ?For on call review [email or web address].  ?

## 2021-10-05 NOTE — Assessment & Plan Note (Addendum)
With ferritin above 100, likely anemia of chronic disease.  Last hemoglobin 11.3. ?

## 2021-10-05 NOTE — Assessment & Plan Note (Addendum)
Of hands and arms.  Continue Lyrica to 50 mg twice daily.  This has helped her quite a bit.  Will prescribe as outpatient. ?

## 2021-10-05 NOTE — Care Management (Signed)
Referral sent to [place] at [facility] ?

## 2021-10-06 DIAGNOSIS — N1831 Chronic kidney disease, stage 3a: Secondary | ICD-10-CM | POA: Diagnosis not present

## 2021-10-06 DIAGNOSIS — I890 Lymphedema, not elsewhere classified: Secondary | ICD-10-CM | POA: Diagnosis not present

## 2021-10-06 DIAGNOSIS — G629 Polyneuropathy, unspecified: Secondary | ICD-10-CM

## 2021-10-06 DIAGNOSIS — D696 Thrombocytopenia, unspecified: Secondary | ICD-10-CM

## 2021-10-06 DIAGNOSIS — L03116 Cellulitis of left lower limb: Secondary | ICD-10-CM | POA: Diagnosis not present

## 2021-10-06 LAB — CBC
HCT: 33.7 % — ABNORMAL LOW (ref 36.0–46.0)
Hemoglobin: 10.6 g/dL — ABNORMAL LOW (ref 12.0–15.0)
MCH: 26.6 pg (ref 26.0–34.0)
MCHC: 31.5 g/dL (ref 30.0–36.0)
MCV: 84.7 fL (ref 80.0–100.0)
Platelets: 144 10*3/uL — ABNORMAL LOW (ref 150–400)
RBC: 3.98 MIL/uL (ref 3.87–5.11)
RDW: 14.5 % (ref 11.5–15.5)
WBC: 4.3 10*3/uL (ref 4.0–10.5)
nRBC: 0 % (ref 0.0–0.2)

## 2021-10-06 LAB — BASIC METABOLIC PANEL
Anion gap: 7 (ref 5–15)
BUN: 20 mg/dL (ref 8–23)
CO2: 31 mmol/L (ref 22–32)
Calcium: 8.1 mg/dL — ABNORMAL LOW (ref 8.9–10.3)
Chloride: 100 mmol/L (ref 98–111)
Creatinine, Ser: 1 mg/dL (ref 0.44–1.00)
GFR, Estimated: >60 mL/min (ref >60)
Glucose, Bld: 91 mg/dL (ref 70–99)
Potassium: 3.9 mmol/L (ref 3.5–5.1)
Sodium: 138 mmol/L (ref 135–145)

## 2021-10-06 MED ORDER — PREGABALIN 50 MG PO CAPS
50.0000 mg | ORAL_CAPSULE | Freq: Two times a day (BID) | ORAL | Status: DC
  Administered 2021-10-06 – 2021-10-08 (×4): 50 mg via ORAL
  Filled 2021-10-06 (×4): qty 1

## 2021-10-06 NOTE — Progress Notes (Signed)
Physical Therapy Treatment ?Patient Details ?Name: [name] ?MRN: [id number] ?DOB: 1955-09-15 ?Today's Date: 10/06/2021 ? ? ?History of Present Illness [name] is a 66 y.o. female with medical history significant of hypertension, asthma, OSA on CPAP, chronic bilateral lymphedema, recurrent lower extremity cellulitis, ".06, morbid obesity with BMI 64.34, former smoker, CKD-3A, dCHF, who presents with worsening left leg swelling and redness. ? ?  ?PT Comments  ? ? Pt received supine in bed agreeable to PT. Pt remains mod-I with bed mobility and transfers from STS to BRW. Pt demonstrating reciprocal gait 150' with BRW with consistent cadence. Remaining 78' to room with use of QC to asses safe use of QC. Pt improving stability and gait with QC with consistent reciprocal gait and correct sequencing of QC throughout only requiring supervision. No unsteadiness noted or need to reach for sturdy objects compared to eval. Pt indep in returning to supine in bed with RN at bedside. Pt back to baseline function being supervision to indep in all aspects of mobility with LRAD. Pt educated on need for new rubber grips for prongs on QC to improve reliability of AD for stability at home otherwise to use her RW for safety at home. Pt verbalizing understanding to education. PT to sign off, pt in agreement.   ?  ?Recommendations for follow up therapy are one component of a multi-disciplinary discharge planning process, led by the attending physician.  Recommendations may be updated based on patient status, additional functional criteria and insurance authorization. ? ?Follow Up Recommendations ? No PT follow up ?  ?  ?Assistance Recommended at Discharge None  ?Patient can return home with the following Assistance with cooking/housework;A little help with walking and/or transfers;A little help with bathing/dressing/bathroom;Help with stairs or ramp for entrance ?  ?Equipment Recommendations ? None recommended by PT  ?   ?Recommendations for Other Services   ? ? ?  ?Precautions / Restrictions Precautions ?Precautions: Fall ?Restrictions ?Weight Bearing Restrictions: No  ?  ? ?Mobility ? Bed Mobility ?Overal bed mobility: Modified Independent ?  ?  ?  ?  ?  ?  ?General bed mobility comments: Has hospital bed at home, uses bed features and sheet to assist in LE management. ?Patient Response: Cooperative ? ?Transfers ?Overall transfer level: Needs assistance ?Equipment used: Rolling walker (2 wheels) (bari) ?Transfers: Sit to/from Stand ?Sit to Stand: Supervision ?  ?  ?  ?  ?  ?General transfer comment: Use of bouts of momentum to stand with UE on bed rail and UE on BRW ?  ? ?Ambulation/Gait ?Ambulation/Gait assistance: Supervision ?Gait Distance (Feet): 200 Feet (150' with BRW, 30' with QC) ?Assistive device: Rolling walker (2 wheels), Quad cane ?Gait Pattern/deviations: Step-to pattern, Wide base of support ?  ?  ?  ?General Gait Details: improved use of QC today with correct step through sequencing with QC in RUE. ? ? ?Stairs ?  ?  ?  ?  ?  ? ? ?Wheelchair Mobility ?  ? ?Modified Rankin (Stroke Patients Only) ?  ? ? ?  ?Balance Overall balance assessment: Needs assistance ?Sitting-balance support: Feet supported, Bilateral upper extremity supported ?Sitting balance-Leahy Scale: Good ?  ?  ?  ?Standing balance-Leahy Scale: Good ?  ?  ?  ?  ?  ?  ?  ?  ?  ?  ?  ?  ?  ? ?  ?Cognition Arousal/Alertness: Awake/alert ?Behavior During Therapy: [facility] for tasks assessed/performed ?Overall Cognitive Status: Within Functional Limits for tasks assessed ?  ?  ?  ?  ?  ?  ?  ?  ?  ?  ?  ?  ?  ?  ?  ?  ?  ?  ?  ? ?  ?  Exercises   ? ?  ?General Comments   ?  ?  ? ?Pertinent Vitals/Pain Pain Assessment ?Pain Assessment: No/denies pain  ? ? ?Home Living   ?  ?  ?  ?  ?  ?  ?  ?  ?  ?   ?  ?Prior Function    ?  ?  ?   ? ?PT Goals (current goals can now be found in the care plan section) Acute Rehab PT Goals ?Patient Stated Goal: to go home ?PT Goal  Formulation: With patient ?Time For Goal Achievement: 10/18/21 ?Potential to Achieve Goals: Good ?Progress towards PT goals: Progressing toward goals ? ?  ?Frequency ? ? ? Min 2X/week ? ? ? ?  ?PT Plan Discharge plan needs to be updated  ? ? ?Co-evaluation   ?  ?  ?  ?  ? ?  ?AM-PAC PT "6 Clicks" Mobility   ?Outcome Measure ? Help needed turning from your back to your side while in a flat bed without using bedrails?: A Lot ?Help needed moving from lying on your back to sitting on the side of a flat bed without using bedrails?: A Lot ?Help needed moving to and from a bed to a chair (including a wheelchair)?: A Little ?Help needed standing up from a chair using your arms (e.g., wheelchair or bedside chair)?: A Little ?Help needed to walk in hospital room?: A Little ?Help needed climbing 3-5 steps with a railing? : A Little ?6 Click Score: 16 ? ?  ?End of Session Equipment Utilized During Treatment: Gait belt ?Activity Tolerance: Patient tolerated treatment well ?Patient left: with call bell/phone within reach;in bed;with nursing/sitter in room ?Nurse Communication: Mobility status ?PT Visit Diagnosis: Unsteadiness on feet (R26.81);Other abnormalities of gait and mobility (R26.89);Difficulty in walking, not elsewhere classified (R26.2) ?  ? ? ?Time: 1610-9604 ?PT Time Calculation (min) (ACUTE ONLY): 22 min ? ?Charges:  $Gait Training: 8-22 mins          ?          ?Salem Caster. Fairly IV, PT, DPT ?Physical Therapist- [place]  ?[facility]  ?10/06/2021, 3:04 PM ? ?

## 2021-10-06 NOTE — Progress Notes (Signed)
?Progress Note ? ? ?Patient: [name]:[id number] DOB: 11/06/1955 DOA: 10/03/2021     3 ?DOS: the patient was seen and examined on 10/06/2021 ?  ? ? ?Assessment and Plan: ?* Cellulitis of left lower extremity ?Failed outpatient treatment with Bactrim (took 12 days at home).  Cellulitis progressed despite being treated with Bactrim.  A new anterior ulcer also developed.  Wound culture from last week showing Enterococcus faecalis and Alcaligenes faecalis.  Continue Zosyn.  With taking off the dressing today the scab came off with it.  Having some drainage from that area.  Spoke with wound care nurse to adjust dressings to be nonstick. ? ?Chronic kidney disease, stage 3a ([place]) ?Creatinine 1.0 today with a GFR greater than 60 ? ?Lymphedema ?Continue Lasix 40 mg twice a day.  We will weigh patient today. ? ?Chronic diastolic CHF (congestive heart failure) ([place]) ?Patient on Tenormin and Lasix.  Will weigh patient today. ? ?Morbid obesity with BMI of 60.0-69.9, adult ([place]) ?Last BMI 64.34. ? ?OSA on CPAP ?Continue CPAP ? ?Thrombocytopenia ([place]) ?Platelet count dropped down to 144.  Continue to monitor.  Check CBC tomorrow. ? ?Neuropathy ?Of hands and arms.  Increase Lyrica to 50 mg twice daily. ? ?Anemia of chronic disease ?With ferritin above 100, likely anemia of chronic disease.  Last hemoglobin 10.9. ? ?Essential hypertension ?Continue atenolol Lasix ? ? ? ? ?  ? ?Subjective: Patient stated that she did well with the Lyrica and it actually helped her hands.  She is interested in going up on twice a day on the Lyrica.  Patient admitted with left lower extremity cellulitis.  The redness on her left leg seems to be moving in the right direction.  Today with undoing the dressing the scab on the ulcer had ripped off. ? ?Physical Exam: ?Vitals:  ? 10/05/21 1527 10/05/21 1959 10/06/21 2585 10/06/21 2778  ?BP: 112/70 121/80 (!) 104/43 129/69  ?Pulse: 63 70 68 67  ?Resp: '16 20 18 16  '$ ?Temp: 98.4 ?F (36.9 ?C) 99.2 ?F  (37.3 ?C) 98.3 ?F (36.8 ?C) 98.4 ?F (36.9 ?C)  ?TempSrc:    Oral  ?SpO2: 97% 96% 96% 98%  ?Weight:      ?Height:      ? ?Physical Exam ?HENT:  ?   Head: Normocephalic.  ?   Mouth/Throat:  ?   Pharynx: No oropharyngeal exudate.  ?Eyes:  ?   General: Lids are normal.  ?   Conjunctiva/sclera: Conjunctivae normal.  ?Cardiovascular:  ?   Rate and Rhythm: Normal rate and regular rhythm.  ?   Heart sounds: Normal heart sounds, S1 normal and S2 normal.  ?Pulmonary:  ?   Breath sounds: Examination of the right-lower field reveals decreased breath sounds. Examination of the left-lower field reveals decreased breath sounds. Decreased breath sounds present. No wheezing, rhonchi or rales.  ?Abdominal:  ?   Palpations: Abdomen is soft.  ?   Tenderness: There is no abdominal tenderness.  ?Musculoskeletal:  ?   Right upper leg: Swelling present.  ?   Left upper leg: Swelling present.  ?   Right lower leg: Swelling present.  ?   Left lower leg: Swelling present.  ?Skin: ?   General: Skin is warm.  ?   Comments: See picture below  ?Neurological:  ?   Mental Status: She is alert and oriented to person, place, and time.  ?  ? ?Data Reviewed: ?Today's creatinine 1.0, platelet count dipped down to 144.  Hemoglobin 10.6. ? ? ?  Disposition: ?Status is: Inpatient ?Remains inpatient appropriate because: Having some drainage from the ulcer.  Redness of the upper part starting to fade.  Redness on the lower part of the leg started to fade. ?  ?Planned Discharge Destination: Home ? ?Author: ?Loletha Grayer, MD ?10/06/2021 2:43 PM ? ?For on call review [email or web address].  ?

## 2021-10-06 NOTE — Consult Note (Signed)
WOC Nurse Consult Note: ?Patient receiving care in [facility] 116. Consult completed remotely after review of record, images, and Secure Chat discussion with Dr. Leslye Peer. ?Reason for Consult: LLE wound related to lymphedema ?Wound type: ulceration of lymphedema ?Pressure Injury POA: Yes/No/NA ?Measurement: ?Wound bed: see photos ?Drainage (amount, consistency, odor) serous ?Periwound: edematous with typical changes associated with lymphedema ?Dressing procedure/placement/frequency: ? ?Cleanse the wound on the LLE with the No Rinse Spray available in the clean utility room.  Pat dry. Place a piece of MEPITEL--[employer] #[id number], a silicone non-adherent dressing directly over the wound. Then place a piece of Drawtex -- Kellie Simmering [phone] --requires ordering from [facility]. Secure with a few turns of kerlix. If the kerlix or the Drawtex becomes stuck SATURATE IT WITH SALINE.  The MEPITEL can stay on the wound for 7 days before changing.  The Drawtex and kerlix MUST be changed daily and prn soilage. ? ?Monitor the wound area(s) for worsening of condition such as: ?Signs/symptoms of infection,  ?Increase in size,  ?Development of or worsening of odor, ?Development of pain, or increased pain at the affected locations.  Notify the medical team if any of these develop. ? ?Thank you for the consult. [place] nurse will not follow at this time.  Please re-consult the [place] team if needed. ? ?Val Riles, RN, MSN, CWOCN, CNS-BC, pager [phone]  ?  ?

## 2021-10-06 NOTE — Assessment & Plan Note (Addendum)
Platelet stable today at 148.  Continue to monitor. ?

## 2021-10-06 NOTE — Plan of Care (Signed)
?  Problem: Education: ?Goal: Knowledge of General Education information will improve ?Description: Including pain rating scale, medication(s)/side effects and non-pharmacologic comfort measures ?Outcome: Progressing ?  ?Problem: Health Behavior/Discharge Planning: ?Goal: Ability to manage health-related needs will improve ?Outcome: Progressing ?  ?Problem: Clinical Measurements: ?Goal: Ability to maintain clinical measurements within normal limits will improve ?Outcome: Progressing ?Goal: Will remain free from infection ?Outcome: Progressing ?Goal: Diagnostic test results will improve ?Outcome: Progressing ?Goal: Respiratory complications will improve ?Outcome: Progressing ?Goal: Cardiovascular complication will be avoided ?Outcome: Progressing ?  ?Problem: Activity: ?Goal: Risk for activity intolerance will decrease ?Outcome: Progressing ?  ?Problem: Nutrition: ?Goal: Adequate nutrition will be maintained ?Outcome: Progressing ?  ?Problem: Coping: ?Goal: Level of anxiety will decrease ?Outcome: Progressing ?  ?Problem: Elimination: ?Goal: Will not experience complications related to bowel motility ?Outcome: Progressing ?Goal: Will not experience complications related to urinary retention ?Outcome: Progressing ?  ?Problem: Pain Managment: ?Goal: General experience of comfort will improve ?Outcome: Progressing ?  ?Problem: Safety: ?Goal: Ability to remain free from injury will improve ?Outcome: Progressing ?  ?Problem: Skin Integrity: ?Goal: Risk for impaired skin integrity will decrease ?Outcome: Progressing ?  ?Problem: Education: ?Goal: Ability to demonstrate management of disease process will improve ?Outcome: Progressing ?Goal: Ability to verbalize understanding of medication therapies will improve ?Outcome: Progressing ?Goal: Individualized Educational Video(s) ?Outcome: Progressing ?  ?Problem: Activity: ?Goal: Capacity to carry out activities will improve ?Outcome: Progressing ?  ?Problem: Cardiac: ?Goal:  Ability to achieve and maintain adequate cardiopulmonary perfusion will improve ?Outcome: Progressing ?  ?Problem: Clinical Measurements: ?Goal: Ability to avoid or minimize complications of infection will improve ?Outcome: Progressing ?  ?Problem: Skin Integrity: ?Goal: Skin integrity will improve ?Outcome: Progressing ?  ?

## 2021-10-06 NOTE — Care Management Important Message (Signed)
Important Message ? ?Patient Details  ?Name: [name] ?MRN: [id number] ?Date of Birth: 1955/12/20 ? ? ?Medicare Important Message Given:  Yes ? ? ? ? ?Juliann Pulse A Nicha Hemann ?10/06/2021, 11:59 AM ?

## 2021-10-07 DIAGNOSIS — I890 Lymphedema, not elsewhere classified: Secondary | ICD-10-CM | POA: Diagnosis not present

## 2021-10-07 DIAGNOSIS — I5032 Chronic diastolic (congestive) heart failure: Secondary | ICD-10-CM | POA: Diagnosis not present

## 2021-10-07 DIAGNOSIS — L03116 Cellulitis of left lower limb: Secondary | ICD-10-CM | POA: Diagnosis not present

## 2021-10-07 DIAGNOSIS — N1831 Chronic kidney disease, stage 3a: Secondary | ICD-10-CM | POA: Diagnosis not present

## 2021-10-07 LAB — CBC
HCT: 36.2 % (ref 36.0–46.0)
Hemoglobin: 11.3 g/dL — ABNORMAL LOW (ref 12.0–15.0)
MCH: 26.4 pg (ref 26.0–34.0)
MCHC: 31.2 g/dL (ref 30.0–36.0)
MCV: 84.6 fL (ref 80.0–100.0)
Platelets: 148 10*3/uL — ABNORMAL LOW (ref 150–400)
RBC: 4.28 MIL/uL (ref 3.87–5.11)
RDW: 14.4 % (ref 11.5–15.5)
WBC: 4.9 10*3/uL (ref 4.0–10.5)
nRBC: 0 % (ref 0.0–0.2)

## 2021-10-07 LAB — BASIC METABOLIC PANEL
Anion gap: 7 (ref 5–15)
BUN: 20 mg/dL (ref 8–23)
CO2: 31 mmol/L (ref 22–32)
Calcium: 8.4 mg/dL — ABNORMAL LOW (ref 8.9–10.3)
Chloride: 99 mmol/L (ref 98–111)
Creatinine, Ser: 1.11 mg/dL — ABNORMAL HIGH (ref 0.44–1.00)
GFR, Estimated: 55 mL/min — ABNORMAL LOW (ref >60)
Glucose, Bld: 95 mg/dL (ref 70–99)
Potassium: 3.9 mmol/L (ref 3.5–5.1)
Sodium: 137 mmol/L (ref 135–145)

## 2021-10-07 MED ORDER — TORSEMIDE 20 MG PO TABS
20.0000 mg | ORAL_TABLET | Freq: Every day | ORAL | Status: DC
  Administered 2021-10-08: 20 mg via ORAL
  Filled 2021-10-07: qty 1

## 2021-10-07 NOTE — Progress Notes (Signed)
?Progress Note ? ? ?Patient: [name] GQB:[id number] DOB: [birth year] DOA: 10/03/2021     4 ?DOS: the patient was seen and examined on 10/07/2021 ?  ? ?Assessment and Plan: ?* Cellulitis of left lower extremity ?Failed outpatient treatment with Bactrim (took 12 days at home).  Cellulitis progressed despite being treated with Bactrim.  A new anterior ulcer also developed.  Wound culture from last week showing Enterococcus faecalis and Alcaligenes faecalis.  Continue Zosyn.  Upper leg starting to fade with the redness.  Posteriorly still having some redness. ? ?Chronic kidney disease, stage 3a ([place]) ?Creatinine 1.11 today with a GFR greater than 55 ? ?Lymphedema ?Patient interested in taking medications once a day we will switch over to torsemide 20 mg daily for tomorrow ? ?Chronic diastolic CHF (congestive heart failure) ([place]) ?Patient on Tenormin and Lasix.  Today's weight 368.06 ? ?Morbid obesity with BMI of 60.0-69.9, adult ([place]) ?Last BMI 64.31 ? ?OSA on CPAP ?Continue CPAP ? ?Thrombocytopenia ([place]) ?Platelet stable today at 148.  Continue to monitor.  Check CBC tomorrow. ? ?Neuropathy ?Of hands and arms.  Continue Lyrica to 50 mg twice daily. ? ?Anemia of chronic disease ?With ferritin above 100, likely anemia of chronic disease.  Last hemoglobin 11.3. ? ?Essential hypertension ?Continue atenolol Lasix ? ? ? ? ?  ? ?Subjective: Patient feeling a lot better with regards to her hands and with the Lyrica.  Cellulitis still little red on her legs but looks better than when she came in.  Having some pain on the right leg. ? ?Physical Exam: ?Vitals:  ? 10/07/21 0515 10/07/21 0802 10/07/21 0936 10/07/21 1200  ?BP: 140/70 (!) 113/55 (!) 142/70 113/71  ?Pulse: 61 (!) 58 74 68  ?Resp: '18 18  18  '$ ?Temp: 97.9 ?F (36.6 ?C) 97.9 ?F (36.6 ?C)  98 ?F (36.7 ?C)  ?TempSrc:    Oral  ?SpO2: 95% 96% 99% 98%  ?Weight:      ?Height:      ? ?Physical Exam ?HENT:  ?   Head: Normocephalic.  ?   Mouth/Throat:  ?   Pharynx: No  oropharyngeal exudate.  ?Eyes:  ?   General: Lids are normal.  ?   Conjunctiva/sclera: Conjunctivae normal.  ?Cardiovascular:  ?   Rate and Rhythm: Normal rate and regular rhythm.  ?   Heart sounds: Normal heart sounds, S1 normal and S2 normal.  ?Pulmonary:  ?   Breath sounds: Examination of the right-lower field reveals decreased breath sounds. Examination of the left-lower field reveals decreased breath sounds. Decreased breath sounds present. No wheezing, rhonchi or rales.  ?Abdominal:  ?   Palpations: Abdomen is soft.  ?   Tenderness: There is no abdominal tenderness.  ?Musculoskeletal:  ?   Right upper leg: Swelling present.  ?   Left upper leg: Swelling present.  ?   Right lower leg: Swelling present.  ?   Left lower leg: Swelling present.  ?Skin: ?   General: Skin is warm.  ?   Comments: Fading erythema top of lower left leg.  Still has an ulcer the new dressing did not rip off any scab.  Lower leg still slight erythematous. ?Posterior bilateral lower extremities a deeper red likely because it is dependent.  ?Neurological:  ?   Mental Status: She is alert and oriented to person, place, and time.  ?  ?Data Reviewed: ?Hemoglobin 11.3.  Platelet count 148.  Creatinine up to 1.11 ? ? ?Disposition: ?Status is: Inpatient ?Remains inpatient appropriate because:  Still requiring IV antibiotics for cellulitis.  Reevaluate tomorrow for potential disposition. ? ?Planned Discharge Destination: Home ? ? ?Author: ?Loletha Grayer, MD ?10/07/2021 3:21 PM ? ?For on call review [email or web address].  ?

## 2021-10-07 NOTE — Plan of Care (Signed)
?  Problem: Education: ?Goal: Knowledge of General Education information will improve ?Description: Including pain rating scale, medication(s)/side effects and non-pharmacologic comfort measures ?Outcome: Progressing ?  ?Problem: Health Behavior/Discharge Planning: ?Goal: Ability to manage health-related needs will improve ?Outcome: Progressing ?  ?Problem: Clinical Measurements: ?Goal: Ability to maintain clinical measurements within normal limits will improve ?Outcome: Progressing ?Goal: Will remain free from infection ?Outcome: Progressing ?Goal: Diagnostic test results will improve ?Outcome: Progressing ?Goal: Respiratory complications will improve ?Outcome: Progressing ?Goal: Cardiovascular complication will be avoided ?Outcome: Progressing ?  ?Problem: Activity: ?Goal: Risk for activity intolerance will decrease ?Outcome: Progressing ?  ?Problem: Nutrition: ?Goal: Adequate nutrition will be maintained ?Outcome: Progressing ?  ?Problem: Coping: ?Goal: Level of anxiety will decrease ?Outcome: Progressing ?  ?Problem: Elimination: ?Goal: Will not experience complications related to bowel motility ?Outcome: Progressing ?Goal: Will not experience complications related to urinary retention ?Outcome: Progressing ?  ?Problem: Pain Managment: ?Goal: General experience of comfort will improve ?Outcome: Progressing ?  ?Problem: Safety: ?Goal: Ability to remain free from injury will improve ?Outcome: Progressing ?  ?Problem: Skin Integrity: ?Goal: Risk for impaired skin integrity will decrease ?Outcome: Progressing ?  ?Problem: Education: ?Goal: Ability to demonstrate management of disease process will improve ?Outcome: Progressing ?Goal: Ability to verbalize understanding of medication therapies will improve ?Outcome: Progressing ?Goal: Individualized Educational Video(s) ?Outcome: Progressing ?  ?Problem: Activity: ?Goal: Capacity to carry out activities will improve ?Outcome: Progressing ?  ?Problem: Cardiac: ?Goal:  Ability to achieve and maintain adequate cardiopulmonary perfusion will improve ?Outcome: Progressing ?  ?Problem: Clinical Measurements: ?Goal: Ability to avoid or minimize complications of infection will improve ?Outcome: Progressing ?  ?Problem: Skin Integrity: ?Goal: Skin integrity will improve ?Outcome: Progressing ?  ?

## 2021-10-07 NOTE — Progress Notes (Signed)
Dressings changed on legs bilaterally. Left leg seems to be improving according to patient. ?

## 2021-10-07 NOTE — Progress Notes (Signed)
Attempted to find standing scale for patient w/charge nurse but we could not locate. ?

## 2021-10-07 NOTE — Progress Notes (Signed)
Mobility Specialist - Progress Note ? ? 10/07/21 1300  ?Mobility  ?Activity Ambulated independently in room;Ambulated independently in hallway;Stood at bedside;Transferred from bed to chair;Dangled on edge of bed  ?Level of Assistance Modified independent, requires aide device or extra time  ?Assistive Device Front wheel walker  ?Distance Ambulated (ft) 200 ft  ?Activity Response Tolerated well  ?$Mobility charge 1 Mobility  ? ? ?Pre-mobility: HR, BP, SpO2 ?During mobility: HR, BP, SpO2 ?Post-mobility: HR, BP, SPO2 ? ?Pt supine upon arrival using RA. Pt completes bed mobility with ModI + extra time using knotted linen for assistance with LE mobility. Pt completes STS with supervision and ambulates with QC to for BM, pt completes pericare with ModI and stands indep ~ 3 minutes. Pt completes 1 lap around nursing station with BRW with SUPERVISION voicing no complaints. Pt returns to chair with alarm set and needs in reach. ? ?[name] ?Mobility Specialist ?10/07/21, 1:13 PM ? ? ? ?

## 2021-10-08 DIAGNOSIS — L03116 Cellulitis of left lower limb: Secondary | ICD-10-CM | POA: Diagnosis not present

## 2021-10-08 DIAGNOSIS — I5032 Chronic diastolic (congestive) heart failure: Secondary | ICD-10-CM | POA: Diagnosis not present

## 2021-10-08 DIAGNOSIS — N1831 Chronic kidney disease, stage 3a: Secondary | ICD-10-CM | POA: Diagnosis not present

## 2021-10-08 DIAGNOSIS — I890 Lymphedema, not elsewhere classified: Secondary | ICD-10-CM | POA: Diagnosis not present

## 2021-10-08 LAB — CULTURE, BLOOD (ROUTINE X 2): Culture: NO GROWTH

## 2021-10-08 MED ORDER — PREGABALIN 50 MG PO CAPS: 50.0000 mg | ORAL_CAPSULE | Freq: Two times a day (BID) | ORAL | 0 refills | Status: DC

## 2021-10-08 MED ORDER — HYDROXYZINE HCL 10 MG PO TABS: 10.0000 mg | ORAL_TABLET | Freq: Three times a day (TID) | ORAL | 0 refills | Status: DC | PRN

## 2021-10-08 MED ORDER — TORSEMIDE 20 MG PO TABS: 20.0000 mg | ORAL_TABLET | Freq: Every day | ORAL | 0 refills | Status: DC

## 2021-10-08 MED ORDER — AMOXICILLIN-POT CLAVULANATE 875-125 MG PO TABS: 1.0000 | ORAL_TABLET | Freq: Two times a day (BID) | ORAL | 0 refills | Status: AC

## 2021-10-08 MED ORDER — SULFAMETHOXAZOLE-TRIMETHOPRIM 800-160 MG PO TABS: 1.0000 | ORAL_TABLET | Freq: Two times a day (BID) | ORAL | 0 refills | Status: AC

## 2021-10-08 MED ORDER — HYDROXYZINE HCL 10 MG PO TABS
10.0000 mg | ORAL_TABLET | Freq: Three times a day (TID) | ORAL | Status: DC | PRN
  Filled 2021-10-08: qty 1

## 2021-10-08 NOTE — Progress Notes (Incomplete)
Patient refused LLE dressing change stating that the nurse can change it after the provider comes to see it because MD took it off [place] after nurse changed it on the previous day. Will inform day team.  ?

## 2021-10-08 NOTE — TOC Transition Note (Addendum)
Transition of Care (TOC) - CM/SW Discharge Note ? ? ?Patient Details  ?Name: [name] ?MRN: [id number] ?Date of Birth: February 07, 1956 ? ?Transition of Care (TOC) CM/SW Contact:  ?Prescious Hurless E Nnenna Meador, LCSW ?Phone Number: ?10/08/2021, 10:54 AM ? ? ?Clinical Narrative:    ?CSW notified by MD that patient is DC today and does not need Home Health, has an appt at the [facility] on Tuesday.  ?Per MD patient needs EMS transport home. EMS paperwork completed. ?Will call for EMS when RN is ready.  ? ?12:40- Checked with RN and patient is ready for transport. Called [name] and added patient to the list. Patient is next on the list. RN notified. ? ?Final next level of care: Home/Self Care ?Barriers to Discharge: Barriers Resolved ? ? ?Patient Goals and CMS Choice ?  ?  ?  ? ?Discharge Placement ?  ?           ?  ?Patient to be transferred to facility by: [name] ?  ?  ? ?Discharge Plan and Services ?  ?  ?           ?  ?  ?  ?  ?  ?  ?  ?  ?  ?  ? ?Social Determinants of Health (SDOH) Interventions ?  ? ? ?Readmission Risk Interventions ? ?  01/20/2021  ?  9:48 AM  ?Readmission Risk Prevention Plan  ?Transportation Screening Complete  ?PCP or Specialist Appt within 5-7 Days Complete  ?Home Care Screening Complete  ?Medication Review (RN CM) Referral to Pharmacy  ? ? ? ? ? ?

## 2021-10-08 NOTE — Plan of Care (Signed)
?  Problem: Education: ?Goal: Knowledge of General Education information will improve ?Description: Including pain rating scale, medication(s)/side effects and non-pharmacologic comfort measures ?Outcome: Progressing ?  ?Problem: Health Behavior/Discharge Planning: ?Goal: Ability to manage health-related needs will improve ?Outcome: Progressing ?  ?Problem: Clinical Measurements: ?Goal: Ability to maintain clinical measurements within normal limits will improve ?Outcome: Progressing ?Goal: Will remain free from infection ?Outcome: Progressing ?Goal: Diagnostic test results will improve ?Outcome: Progressing ?Goal: Respiratory complications will improve ?Outcome: Progressing ?Goal: Cardiovascular complication will be avoided ?Outcome: Progressing ?  ?Problem: Activity: ?Goal: Risk for activity intolerance will decrease ?Outcome: Progressing ?  ?Problem: Nutrition: ?Goal: Adequate nutrition will be maintained ?Outcome: Progressing ?  ?Problem: Coping: ?Goal: Level of anxiety will decrease ?Outcome: Progressing ?  ?Problem: Elimination: ?Goal: Will not experience complications related to bowel motility ?Outcome: Progressing ?Goal: Will not experience complications related to urinary retention ?Outcome: Progressing ?  ?Problem: Pain Managment: ?Goal: General experience of comfort will improve ?Outcome: Progressing ?  ?Problem: Safety: ?Goal: Ability to remain free from injury will improve ?Outcome: Progressing ?  ?Problem: Skin Integrity: ?Goal: Risk for impaired skin integrity will decrease ?Outcome: Progressing ?  ?Problem: Education: ?Goal: Ability to demonstrate management of disease process will improve ?Outcome: Progressing ?Goal: Ability to verbalize understanding of medication therapies will improve ?Outcome: Progressing ?Goal: Individualized Educational Video(s) ?Outcome: Progressing ?  ?Problem: Activity: ?Goal: Capacity to carry out activities will improve ?Outcome: Progressing ?  ?Problem: Cardiac: ?Goal:  Ability to achieve and maintain adequate cardiopulmonary perfusion will improve ?Outcome: Progressing ?  ?Problem: Clinical Measurements: ?Goal: Ability to avoid or minimize complications of infection will improve ?Outcome: Progressing ?  ?Problem: Skin Integrity: ?Goal: Skin integrity will improve ?Outcome: Progressing ?  ?

## 2021-10-08 NOTE — Plan of Care (Signed)
?Problem: Education: ?Goal: Knowledge of General Education information will improve ?Description: Including pain rating scale, medication(s)/side effects and non-pharmacologic comfort measures ?10/08/2021 1136 by Jalicia Roszak, Camillo Flaming, RN ?Outcome: Adequate for Discharge ?10/08/2021 1128 by Alka Falwell, Camillo Flaming, RN ?Outcome: Progressing ?  ?Problem: Health Behavior/Discharge Planning: ?Goal: Ability to manage health-related needs will improve ?10/08/2021 1136 by Kitiara Hintze, Camillo Flaming, RN ?Outcome: Adequate for Discharge ?10/08/2021 1128 by Nitzia Perren, Camillo Flaming, RN ?Outcome: Progressing ?  ?Problem: Clinical Measurements: ?Goal: Ability to maintain clinical measurements within normal limits will improve ?10/08/2021 1136 by Margherita Collyer, Camillo Flaming, RN ?Outcome: Adequate for Discharge ?10/08/2021 1128 by Amrom Ore, Camillo Flaming, RN ?Outcome: Progressing ?Goal: Will remain free from infection ?10/08/2021 1136 by Agueda Houpt, Camillo Flaming, RN ?Outcome: Adequate for Discharge ?10/08/2021 1128 by Charie Pinkus, Camillo Flaming, RN ?Outcome: Progressing ?Goal: Diagnostic test results will improve ?10/08/2021 1136 by Rovena Hearld, Camillo Flaming, RN ?Outcome: Adequate for Discharge ?10/08/2021 1128 by Jaunita Mikels, Camillo Flaming, RN ?Outcome: Progressing ?Goal: Respiratory complications will improve ?10/08/2021 1136 by Alvie Fowles, Camillo Flaming, RN ?Outcome: Adequate for Discharge ?10/08/2021 1128 by Korry Dalgleish, Camillo Flaming, RN ?Outcome: Progressing ?Goal: Cardiovascular complication will be avoided ?10/08/2021 1136 by Jayziah Bankhead, Camillo Flaming, RN ?Outcome: Adequate for Discharge ?10/08/2021 1128 by Astraea Gaughran, Camillo Flaming, RN ?Outcome: Progressing ?  ?Problem: Activity: ?Goal: Risk for activity intolerance will decrease ?10/08/2021 1136 by Cola Highfill, Camillo Flaming, RN ?Outcome: Adequate for Discharge ?10/08/2021 1128 by Jahni Paul, Camillo Flaming, RN ?Outcome: Progressing ?  ?Problem: Nutrition: ?Goal: Adequate nutrition will be maintained ?10/08/2021 1136 by Willadeen Colantuono, Camillo Flaming, RN ?Outcome: Adequate for Discharge ?10/08/2021 1128 by Genesis Paget, Camillo Flaming, RN ?Outcome: Progressing ?  ?Problem: Coping: ?Goal: Level of anxiety will decrease ?10/08/2021 1136 by Tsuneo Faison, Camillo Flaming, RN ?Outcome: Adequate for Discharge ?10/08/2021 1128 by Coran Dipaola, Camillo Flaming, RN ?Outcome: Progressing ?  ?Problem: Elimination: ?Goal: Will not experience complications related to bowel motility ?10/08/2021 1136 by Laketta Soderberg, Camillo Flaming, RN ?Outcome: Adequate for Discharge ?10/08/2021 1128 by Nakiesha Rumsey, Camillo Flaming, RN ?Outcome: Progressing ?Goal: Will not experience complications related to urinary retention ?10/08/2021 1136 by Archit Leger, Camillo Flaming, RN ?Outcome: Adequate for Discharge ?10/08/2021 1128 by Schelly Chuba, Camillo Flaming, RN ?Outcome: Progressing ?  ?Problem: Pain Managment: ?Goal: General experience of comfort will improve ?10/08/2021 1136 by Raziel Koenigs, Camillo Flaming, RN ?Outcome: Adequate for Discharge ?10/08/2021 1128 by Hassen Bruun, Camillo Flaming, RN ?Outcome: Progressing ?  ?Problem: Safety: ?Goal: Ability to remain free from injury will improve ?10/08/2021 1136 by Kinley Dozier, Camillo Flaming, RN ?Outcome: Adequate for Discharge ?10/08/2021 1128 by Ollis Daudelin, Camillo Flaming, RN ?Outcome: Progressing ?  ?Problem: Skin Integrity: ?Goal: Risk for impaired skin integrity will decrease ?10/08/2021 1136 by Shervin Cypert, Camillo Flaming, RN ?Outcome: Adequate for Discharge ?10/08/2021 1128 by Marlen Koman, Camillo Flaming, RN ?Outcome: Progressing ?  ?Problem: Education: ?Goal: Ability to demonstrate management of disease process will improve ?10/08/2021 1136 by Nikeria Kalman, Camillo Flaming, RN ?Outcome: Adequate for Discharge ?10/08/2021 1128 by Breanna Shorkey, Camillo Flaming, RN ?Outcome: Progressing ?Goal: Ability to verbalize understanding of medication therapies will improve ?10/08/2021 1136 by Hinley Brimage, Camillo Flaming, RN ?Outcome: Adequate for Discharge ?10/08/2021 1128 by Bodhi Stenglein, Camillo Flaming, RN ?Outcome: Progressing ?Goal: Individualized Educational Video(s) ?10/08/2021 1136 by Pallavi Clifton, Camillo Flaming, RN ?Outcome: Adequate for Discharge ?10/08/2021 1128 by Zenith Kercheval, Camillo Flaming, RN ?Outcome: Progressing ?   ?Problem: Activity: ?Goal: Capacity to carry out activities will improve ?10/08/2021 1136 by Kate Sweetman, Camillo Flaming, RN ?Outcome: Adequate for Discharge ?10/08/2021 1128 by Sri Clegg, Camillo Flaming, RN ?Outcome: Progressing ?  ?Problem: Cardiac: ?Goal: Ability to achieve and maintain adequate cardiopulmonary perfusion  will improve ?10/08/2021 1136 by Keyuna Cuthrell, Camillo Flaming, RN ?Outcome: Adequate for Discharge ?10/08/2021 1128 by Davante Gerke, Camillo Flaming, RN ?Outcome: Progressing ?  ?Problem: Clinical Measurements: ?Goal: Ability to avoid or minimize complications of infection will improve ?10/08/2021 1136 by Christopher Glasscock, Camillo Flaming, RN ?Outcome: Adequate for Discharge ?10/08/2021 1128 by Erman Thum, Camillo Flaming, RN ?Outcome: Progressing ?  ?Problem: Skin Integrity: ?Goal: Skin integrity will improve ?10/08/2021 1136 by Carrissa Taitano, Camillo Flaming, RN ?Outcome: Adequate for Discharge ?10/08/2021 1128 by Ysabella Babiarz, Camillo Flaming, RN ?Outcome: Progressing ?  ?

## 2021-10-08 NOTE — Progress Notes (Signed)
IV team nurse Manual Meier recommended me change the patient's dressing and use biopatch, gauze, paper tape and kerlix (if blisters are bad) in place of the tegaderm dressing. Dressing has to be changed q48hrs. ?

## 2021-10-08 NOTE — Progress Notes (Signed)
Mobility Specialist - Progress Note ? ? ? 10/08/21 1300  ?Mobility  ?Activity Ambulated with assistance in hallway;Stood at bedside;Dangled on edge of bed;Transferred from bed to chair  ?Level of Assistance Standby assist, set-up cues, supervision of patient - no hands on  ?Assistive Device Front wheel walker  ?Distance Ambulated (ft) 200 ft  ?Activity Response Tolerated well  ?$Mobility charge 1 Mobility  ? ? ?Pre-mobility: 72 HR, 94%SpO2 ?During mobility: 89 HR,  92% SpO2 ? ?Pt supine upon arrival using RA. Pt completes bed mobility to sit EOB with ModI + extra time for LE mobility. Pt completes STS with ModI and ambulates 255f with supervision -- voices no complaints and returns to chair with alarm set and needs in reach. ? ?MMerrily Brittle?Mobility Specialist ?10/08/21, 1:12 PM ? ? ? ? ?

## 2021-10-08 NOTE — Progress Notes (Signed)
Day shift nurse stated that patient had a blister reaction to midline dressing. Stated that she would put in a note regarding incident. Assessed midline and it is infusing fine w/o leaking or infiltration. Issue is the skin around the distal part of the line blistering. Had charge nurse view area and a IV team consult was placed. Need a dressing that will not cause blistering.  ?

## 2021-10-08 NOTE — Discharge Summary (Signed)
?Physician Discharge Summary ?  ?Patient: [name] MRN: [id number] DOB: [birth year]  ?Admit date:     10/03/2021  ?Discharge date: 10/08/21  ?Discharge Physician: Loletha Grayer  ? ?PCP: Ranae Plumber, [place]  ? ?Recommendations at discharge:  ? ?Follow-up PCP 5 days ?Follow-up wound care center on Tuesday keep appointment. ? ?Discharge Diagnoses: ?Principal Problem: ?  Cellulitis of left lower extremity ?Active Problems: ?  Lymphedema ?  Chronic kidney disease, stage 3a ([place]) ?  Chronic diastolic CHF (congestive heart failure) ([place]) ?  Morbid obesity with BMI of 60.0-69.9, adult ([place]) ?  OSA on CPAP ?  Asthma ?  Essential hypertension ?  Anemia of chronic disease ?  Neuropathy ?  Thrombocytopenia ([place]) ? ? ? ?Hospital Course: ?The patient was admitted to the hospital on 10/03/2021 and discharged on 10/08/2021.  Came in with failing outpatient treatment for lower extremity cellulitis.  Patient was on Bactrim prior to coming into the hospital.  She had a rash with the Bactrim.  In reviewing the prior wound culture done as outpatient.  Enterococcus faecalis and Alcaligenes faecalis growing out of the cultures.  I switched antibiotics to Zosyn which should cover both organisms.  Upon disposition out of the hospital and switch to Augmentin for another 10 days and we will do Bactrim for another 7 days.  With the Bactrim she did develop a rash and itching I did prescribe Atarax to go home with.  The patient did develop a new anterior ulcer on the left lower extremity prior to coming in.  The patient's erythema had improved down to the top of her lower leg and the bottom of her lower leg. ? ?Our wound care nurse evaluated and recommended cleansing the wound daily.  Placed a piece of mepitel Kellie Simmering #[id number], a silicone nonadherent dressing directly over the wound which can stay on for 7 days).  Then place a piece of drawtex Kellie Simmering [phone]) on top.  Secure with Kerlix and cover with Ace wrap.  The patient will follow-up  with the wound care center on Tuesday. ? ?Assessment and Plan: ?* Cellulitis of left lower extremity ?Failed outpatient treatment with Bactrim (took 12 days at home).  Cellulitis progressed despite being treated with Bactrim.  A new anterior ulcer also developed.  Wound culture from last week showing Enterococcus faecalis and Alcaligenes faecalis.  Continue Zosyn while here.  Upper leg starting to fade with the redness.  I switched over to Augmentin and Bactrim upon discharge. ? ?Chronic kidney disease, stage 3a ([place]) ?Creatinine 1.11 yesterday with a GFR greater than 55.  Recommend checking BMP and follow-up appointment ? ?Lymphedema ?Patient interested in taking medications once a day. I switched her Lasix over to torsemide 20 mg daily. ? ?Chronic diastolic CHF (congestive heart failure) ([place]) ?Patient on Tenormin and Lasix.  Today's weight 367.18 with a BMI of 64.16. ? ?Morbid obesity with BMI of 60.0-69.9, adult ([place]) ?Last BMI 64.16 ? ?OSA on CPAP ?Patient on CPAP at night while here ? ?Thrombocytopenia ([place]) ?Platelet stable today at 148.  Continue to monitor. ? ?Neuropathy ?Of hands and arms.  Continue Lyrica to 50 mg twice daily.  This has helped her quite a bit.  Will prescribe as outpatient. ? ?Anemia of chronic disease ?With ferritin above 100, likely anemia of chronic disease.  Last hemoglobin 11.3. ? ?Essential hypertension ?Continue atenolol, Lasix ? ? ? ? ?  ? ? ?Consultants: Case discussed with infectious disease pharmacist during the hospital course ?Procedures performed: None ?Disposition: Home ?  Diet recommendation:  ?Cardiac diet ?DISCHARGE MEDICATION: ?Allergies as of 10/08/2021   ? ?   Reactions  ? Ace Inhibitors Hives, Swelling  ? Ibuprofen Hives, Swelling  ? Vancomycin Itching, Nausea And Vomiting  ? ?  ? ?  ?Medication List  ?  ? ?STOP taking these medications   ? ?furosemide 20 MG tablet ?Commonly known as: LASIX ?  ? ?  ? ?TAKE these medications   ? ?acetaminophen 500 MG tablet ?Commonly  known as: TYLENOL ?Take 500-1,000 mg by mouth every 6 (six) hours as needed for mild pain or fever. ?  ?amoxicillin-clavulanate 875-125 MG tablet ?Commonly known as: Augmentin ?Take 1 tablet by mouth 2 (two) times daily for 10 days. ?  ?atenolol 50 MG tablet ?Commonly known as: TENORMIN ?Take 50 mg by mouth daily. ?  ?cetirizine 10 MG tablet ?Commonly known as: ZYRTEC ?Take 1 tablet (10 mg total) by mouth daily. ?  ?hydrOXYzine 10 MG tablet ?Commonly known as: ATARAX ?Take 1 tablet (10 mg total) by mouth 3 (three) times daily as needed for itching. ?  ?methocarbamol 500 MG tablet ?Commonly known as: ROBAXIN ?Take 1 tablet (500 mg total) by mouth every 8 (eight) hours as needed for muscle spasms. ?  ?pregabalin 50 MG capsule ?Commonly known as: LYRICA ?Take 1 capsule (50 mg total) by mouth 2 (two) times daily. ?  ?sulfamethoxazole-trimethoprim 800-160 MG tablet ?Commonly known as: BACTRIM DS ?Take 1 tablet by mouth 2 (two) times daily for 7 days. ?  ?torsemide 20 MG tablet ?Commonly known as: DEMADEX ?Take 1 tablet (20 mg total) by mouth daily. ?  ? ?  ? ? Follow-up Information   ? ? Ranae Plumber, PA Follow up in 5 day(s).   ?Specialty: Family Medicine ?Contact information: ?[place] [address] ?[phone] ? ? ?  ?  ? ? wound care center Follow up.   ?Why: Keep appointment on tuesday ? ?  ?  ? ?  ?  ? ?  ? ?Discharge Exam: ?Filed Weights  ? 10/06/21 1456 10/07/21 0500 10/08/21 0500  ?Weight: (!) 169.4 kg (!) 167.3 kg (!) 166.9 kg  ? ?Physical Exam ?HENT:  ?   Head: Normocephalic.  ?   Mouth/Throat:  ?   Pharynx: No oropharyngeal exudate.  ?Eyes:  ?   General: Lids are normal.  ?   Conjunctiva/sclera: Conjunctivae normal.  ?Cardiovascular:  ?   Rate and Rhythm: Normal rate and regular rhythm.  ?   Heart sounds: Normal heart sounds, S1 normal and S2 normal.  ?Pulmonary:  ?   Breath sounds: Examination of the right-lower field reveals decreased breath sounds. Examination of the left-lower field  reveals decreased breath sounds. Decreased breath sounds present. No wheezing, rhonchi or rales.  ?Abdominal:  ?   Palpations: Abdomen is soft.  ?   Tenderness: There is no abdominal tenderness.  ?Musculoskeletal:  ?   Right upper leg: Swelling present.  ?   Left upper leg: Swelling present.  ?   Right lower leg: Swelling present.  ?   Left lower leg: Swelling present.  ?Skin: ?   General: Skin is warm.  ?   Comments: Fading erythema top of lower left leg.  Still has an ulcer the new dressing did not rip off any scab.  Lower leg still slight erythematous. ?Posterior bilateral lower extremities a deeper red likely because it is dependent.  ?Neurological:  ?   Mental Status: She is alert and oriented to person, place,  and time.  ?  ? ?Condition at discharge: stable ? ?The results of significant diagnostics from this hospitalization (including imaging, microbiology, ancillary and laboratory) are listed below for reference.  ? ?Imaging Studies: ?US Venous Img Lower Bilateral (DVT) ? ?Result Date: 10/03/2021 ?CLINICAL DATA:  Bilateral lower extremity edema. EXAM: BILATERAL LOWER EXTREMITY VENOUS DOPPLER ULTRASOUND TECHNIQUE: Gray-scale sonography with graded compression, as well as color Doppler and duplex ultrasound were performed to evaluate the lower extremity deep venous systems from the level of the common femoral vein and including the common femoral, femoral, profunda femoral, popliteal and calf veins including the posterior tibial, peroneal and gastrocnemius veins when visible. The superficial great saphenous vein was also interrogated. Spectral Doppler was utilized to evaluate flow at rest and with distal augmentation maneuvers in the common femoral, femoral and popliteal veins. COMPARISON:  None. FINDINGS: RIGHT LOWER EXTREMITY Common Femoral Vein: No evidence of thrombus. Normal compressibility, respiratory phasicity and response to augmentation. Saphenofemoral Junction: No evidence of thrombus. Normal  compressibility and flow on color Doppler imaging. Profunda Femoral Vein: No evidence of thrombus. Normal compressibility and flow on color Doppler imaging. Femoral Vein: No evidence of thrombus. Normal compressib

## 2021-10-10 ENCOUNTER — Encounter: Payer: Medicare HMO | Attending: Physician Assistant | Admitting: Physician Assistant

## 2021-10-10 DIAGNOSIS — L97811 Non-pressure chronic ulcer of other part of right lower leg limited to breakdown of skin: Secondary | ICD-10-CM | POA: Insufficient documentation

## 2021-10-10 DIAGNOSIS — I110 Hypertensive heart disease with heart failure: Secondary | ICD-10-CM | POA: Insufficient documentation

## 2021-10-10 DIAGNOSIS — I890 Lymphedema, not elsewhere classified: Secondary | ICD-10-CM | POA: Insufficient documentation

## 2021-10-10 DIAGNOSIS — G4733 Obstructive sleep apnea (adult) (pediatric): Secondary | ICD-10-CM | POA: Diagnosis not present

## 2021-10-10 DIAGNOSIS — E11621 Type 2 diabetes mellitus with foot ulcer: Secondary | ICD-10-CM | POA: Diagnosis not present

## 2021-10-10 DIAGNOSIS — L97821 Non-pressure chronic ulcer of other part of left lower leg limited to breakdown of skin: Secondary | ICD-10-CM | POA: Insufficient documentation

## 2021-10-10 NOTE — Progress Notes (Addendum)
JULITA, OZBUN ([id number]) ?Visit Report for 10/10/2021 ?Chief Complaint Document Details ?Patient Name: [name], [name] ?Date of Service: 10/10/2021 11:45 AM ?Medical Record Number: [id number] ?Patient Account Number: [id number] ?Date of Birth/Sex: [birth year] (66 y.o. F) ?Treating RN: Cornell Barman ?Primary Care Provider: Ranae Plumber Other Clinician: ?Referring Provider: Ranae Plumber ?Treating Provider/Extender: Jeri Cos ?Weeks in Treatment: 10 ?Information Obtained from: Patient ?Chief Complaint ?Bilateral LE lymphedema with Left LE Ulcers ?Electronic Signature(s) ?Signed: 10/10/2021 12:03:39 PM By: Worthy Keeler PA-C ?Entered By: Worthy Keeler on 10/10/2021 12:03:39 ?[name], [name] ([id number]) ?-------------------------------------------------------------------------------- ?HPI Details ?Patient Name: [name], [name] ?Date of Service: 10/10/2021 11:45 AM ?Medical Record Number: [id number] ?Patient Account Number: [id number] ?Date of Birth/Sex: 15-Oct-1955 (66 y.o. F) ?Treating RN: Cornell Barman ?Primary Care Provider: Ranae Plumber Other Clinician: ?Referring Provider: Ranae Plumber ?Treating Provider/Extender: Jeri Cos ?Weeks in Treatment: 10 ?History of Present Illness ?HPI Description: The patient is a 66 year old female with history of hypertension and a long-standing history of bilateral lower extremity ?lymphedema (first presented on 4/2) . She has had open ulcers in the past which have always responded to compression therapy. She had briefly ?been to a lymphedema clinic in the past which helped her at the time. ?this time around she stopped treatment of her lymphedema pumps approximately 2 weeks ago because of some pain in the knees and then ?noticed the right leg getting worse. She was seen by her PCP who put her on clindamycin 4 times a day 2 days ago. ?The patient has seen [facility] and Dr. Delana Meyer had seen her last year where a vascular study including venous and arterial duplex studies were  within ?normal limits. ?he had recommended compression stockings and lymphedema pumps and the patient has been using this in about 2 weeks ago. ?She is known to be diabetic but in the past few time she's gone to her primary care doctor her hemoglobin A1c has been normal. ?02/11/2015 - after her last visit she took my advice and went to the ER regarding the progressive cellulitis of her right lower extremity and she ?was admitted between July 17 and 22nd. She received IV antibiotics and then was sent home on a course of steroid-induced and oral antibiotics. ?She has improved much since then. ?02/17/2015 -- she has been doing fine and the weeping of her legs has remarkably gone down. She has no fresh issues. ?READMISSION ?01/15/18 ?This patient was given this clinic before most recently in 2016 seen by Dr. Con Memos. She has massive bilateral lymphedema and over the last 2 ?months this had weeping edema out of the left leg. She has compression pumps but her compliance with these has been minimal. She has [facility] they've been using TCA/ABDs/kerlix under an Ace wrap.she has had recent problems with cellulitis. She was apparently seen ?in the ER and 12/23/17 and given clindamycin. She was then followed by her primary doctor and given doxycycline and Keflex. The pain seems to ?have settled down. ?In April 2018 the patient had arterial studies done at [facility]. This showed triphasic waveforms throughout the right leg and ?mostly triphasic waveforms on the left except for monophasic at the posterior tibial artery distally. She was not felt to have evidence of right lower ?extremity arterial stenosis or significant problems on the left side. 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 She also had ?a right lower extremity venous Doppler in January 2018 this was limited by the patient's body  habitus and lymphedema. Most of the proximal ?veins were not visualized ?The patient  presents with an area of denuded skin on the anterior medial part of the left calf. There is weeping edema fluid here. ?01/22/18; the patient has somewhat better edema control using her compression pumps twice a day and as a result she has much better ?epithelialization on the left anterior calf area. Only a small open area remains. ?01/29/18; the patient has been compliant with her compression pumps. Both the areas on her calf that healed. The remaining area on the left ?anterior leg is fully epithelialized ?Readmission: ?02/20/2019 upon evaluation today patient presents for reevaluation due to issues that she is having with the bilateral lower extremities. She ?actually has wounds open on both legs. On the right she has an area in the crease of her leg on the right around the knee region which is actually ?draining quite a bit and actually has some fungal type appearance to it. She has been on nystatin powder that seems to have helped to some ?degree. In regard to the left lower extremity this is actually in the lower portion of her leg closer to the ankle and again is continuing to drain as ?well unfortunately. There does not appear to be any signs of active infection at this time which is good news. No fevers, chills, nausea, vomiting, ?or diarrhea. She tells me that since she was seen last year she is actually been doing quite well for the most part with regard to her lower ?extremities. Unfortunately she now is experiencing a little bit more drainage at this time. She is concerned about getting this under control so that ?it does not get significantly worse. ?02/27/2019 on evaluation today patient appears to be doing somewhat better in regard to her bilateral lower extremity wounds. She has been ?tolerating the dressing changes without complication. Fortunately there is no signs of active infection at this point. No fevers, chills, nausea, ?vomiting, or diarrhea. She did get her dressing supplies which is  excellent news she was extremely excited to get these. She also got paperwork ?from [employer] for their financial assistance program where they may be able to help her out in the future if needed with supplies at discounted ?prices. ?03/06/2019 on evaluation today patient appears to be doing a little worse with regard to both areas of weeping on her bilateral lower extremities. ?This is around the right medial knee and just above the left ankle. With that being said she is unfortunately not doing as well as I would like to ?see. I feel like she may need to potentially go see someone at the lymphedema clinic as the wraps that she needs or even beyond what we can do ?here at the wound care center. She really does not have wounds she just has open areas of weeping that are causing some difficulty for her. ?Subsequently because of this and the moisture I am concerned about the potential for infection I am going to likely give her a prophylactic ?antibiotic today, Keflex, just to be on the safe side. Nonetheless again there is no obvious signs of active infection at this time. ?03/13/2019 on evaluation today patient appears to be doing well with regard to her bilateral lower extremities where she has been weeping ?compared to even last week's evaluation. I see some areas of new skin growth which is excellent and overall I am very pleased with how things ?seem to be progressing. No fevers, chills, nausea, vomiting, or diarrhea. ?Brix,  [name] ([id number]) ?03/20/2019 on evaluation today patient unfortunately is continuing to have issues with significant edema of the left lower extremity. Her right side ?seems to be doing much better. Unfortunately her left side is showing increased weeping of the lower portion of her leg. This is quite unfortunate ?obviously we were hoping to get her into the lymphedema clinic they really do not seem to when I see her how if she is draining. Despite the fact ?this is really not wound related  but more lymphedema weeping related. Nonetheless I do not know that this can be helpful for her to even go for ?that appointment since again I am not sure there is much that they would actually do at this point. We m

## 2021-10-10 NOTE — Progress Notes (Signed)
[name], [name] ([id number]) ?Visit Report for 10/10/2021 ?Arrival Information Details ?Patient Name: [name], [name] ?Date of Service: 10/10/2021 11:45 AM ?Medical Record Number: [id number] ?Patient Account Number: [id number] ?Date of Birth/Sex: 11-27-1955 (66 y.o. F) ?Treating RN: Cornell Barman ?Primary Care Rohith Fauth: Ranae Plumber Other Clinician: ?Referring Jaydyn Bozzo: Ranae Plumber ?Treating Krist Rosenboom/Extender: Jeri Cos ?Weeks in Treatment: 10 ?Visit Information History Since Last Visit ?Added or deleted any medications: Yes ?Patient Arrived: [name] ?Hospitalized since last visit: Yes ?Arrival Time: 11:55 ?Has Dressing in Place as Prescribed: Yes ?Accompanied By: self ?Pain Present Now: No ?Transfer Assistance: None ?Patient Identification Verified: Yes ?Secondary Verification Process Completed: Yes ?Electronic Signature(s) ?Signed: 10/10/2021 3:17:22 PM By: Gretta Cool, BSN, RN, CWS, Kim RN, BSN ?Entered By: Gretta Cool, BSN, RN, CWS, Kim on 10/10/2021 11:59:35 ?[name], [name] ([id number]) ?-------------------------------------------------------------------------------- ?Clinic Level of Care Assessment Details ?Patient Name: [name], [name] ?Date of Service: 10/10/2021 11:45 AM ?Medical Record Number: [id number] ?Patient Account Number: [id number] ?Date of Birth/Sex: October 05, 1955 (66 y.o. F) ?Treating RN: Cornell Barman ?Primary Care Holbert Caples: Ranae Plumber Other Clinician: ?Referring Isaack Preble: Ranae Plumber ?Treating Santhiago Collingsworth/Extender: Jeri Cos ?Weeks in Treatment: 10 ?Clinic Level of Care Assessment Items ?TOOL 4 Quantity Score ?'[]'$  - Use when only an EandM is performed on FOLLOW-UP visit 0 ?ASSESSMENTS - Nursing Assessment / Reassessment ?X - Reassessment of Co-morbidities (includes updates in patient status) 1 10 ?X- 1 5 ?Reassessment of Adherence to Treatment Plan ?ASSESSMENTS - Wound and Skin Assessment / Reassessment ?'[]'$  - Simple Wound Assessment / Reassessment - one wound 0 ?'[]'$  - 0 ?Complex Wound Assessment /  Reassessment - multiple wounds ?'[]'$  - 0 ?Dermatologic / Skin Assessment (not related to wound area) ?ASSESSMENTS - Focused Assessment ?X - Circumferential Edema Measurements - multi extremities 1 5 ?'[]'$  - 0 ?Nutritional Assessment / Counseling / Intervention ?X- 1 5 ?Lower Extremity Assessment (monofilament, tuning fork, pulses) ?'[]'$  - 0 ?Peripheral Arterial Disease Assessment (using hand held doppler) ?ASSESSMENTS - Ostomy and/or Continence Assessment and Care ?'[]'$  - Incontinence Assessment and Management 0 ?'[]'$  - 0 ?Ostomy Care Assessment and Management (repouching, etc.) ?PROCESS - Coordination of Care ?X - Simple Patient / Family Education for ongoing care 1 15 ?'[]'$  - 0 ?Complex (extensive) Patient / Family Education for ongoing care ?X- 1 10 ?Staff obtains Consents, Records, Test Results / Process Orders ?'[]'$  - 0 ?Staff telephones HHA, Nursing Homes / Clarify orders / etc ?'[]'$  - 0 ?Routine Transfer to another Facility (non-emergent condition) ?'[]'$  - 0 ?Routine Hospital Admission (non-emergent condition) ?'[]'$  - 0 ?New Admissions / Biomedical engineer / Ordering NPWT, Apligraf, etc. ?'[]'$  - 0 ?Emergency Hospital Admission (emergent condition) ?X- 1 10 ?Simple Discharge Coordination ?'[]'$  - 0 ?Complex (extensive) Discharge Coordination ?PROCESS - Special Needs ?'[]'$  - Pediatric / Minor Patient Management 0 ?'[]'$  - 0 ?Isolation Patient Management ?'[]'$  - 0 ?Hearing / Language / Visual special needs ?'[]'$  - 0 ?Assessment of Community assistance (transportation, D/C planning, etc.) ?'[]'$  - 0 ?Additional assistance / Altered mentation ?'[]'$  - 0 ?Support Surface(s) Assessment (bed, cushion, seat, etc.) ?INTERVENTIONS - Wound Cleansing / Measurement ?[name], [name] ([id number]) ?'[]'$  - 0 ?Simple Wound Cleansing - one wound ?'[]'$  - 0 ?Complex Wound Cleansing - multiple wounds ?'[]'$  - 0 ?Wound Imaging (photographs - any number of wounds) ?'[]'$  - 0 ?Wound Tracing (instead of photographs) ?'[]'$  - 0 ?Simple Wound Measurement - one wound ?'[]'$  - 0 ?Complex  Wound Measurement - multiple wounds ?INTERVENTIONS - Wound Dressings ?'[]'$  - Small Wound Dressing one or multiple  wounds 0 ?X- 1 15 ?Medium Wound Dressing one or multiple wounds ?X- 1 20 ?Large Wound Dressing one or multiple wounds ?'[]'$  - 0 ?Application of Medications - topical ?'[]'$  - 0 ?Application of Medications - injection ?INTERVENTIONS - Miscellaneous ?'[]'$  - External ear exam 0 ?'[]'$  - 0 ?Specimen Collection (cultures, biopsies, blood, body fluids, etc.) ?'[]'$  - 0 ?Specimen(s) / Culture(s) sent or taken to Lab for analysis ?'[]'$  - 0 ?Patient Transfer (multiple staff / Civil Service fast streamer / Similar devices) ?'[]'$  - 0 ?Simple Staple / Suture removal (25 or less) ?'[]'$  - 0 ?Complex Staple / Suture removal (26 or more) ?'[]'$  - 0 ?Hypo / Hyperglycemic Management (close monitor of Blood Glucose) ?'[]'$  - 0 ?Ankle / Brachial Index (ABI) - do not check if billed separately ?X- 1 5 ?Vital Signs ?Has the patient been seen at the hospital within the last three years: Yes ?Total Score: 100 ?Level Of Care: New/Established - Level ?3 ?Electronic Signature(s) ?Signed: 10/10/2021 3:17:22 PM By: Gretta Cool, BSN, RN, CWS, Kim RN, BSN ?Entered By: Gretta Cool, BSN, RN, CWS, Kim on 10/10/2021 12:25:04 ?[name], [name] ([id number]) ?-------------------------------------------------------------------------------- ?Encounter Discharge Information Details ?Patient Name: [name], [name] ?Date of Service: 10/10/2021 11:45 AM ?Medical Record Number: [id number] ?Patient Account Number: [id number] ?Date of Birth/Sex: 1956/01/22 (66 y.o. F) ?Treating RN: Cornell Barman ?Primary Care Eylin Pontarelli: Ranae Plumber Other Clinician: ?Referring Ilya Ess: Ranae Plumber ?Treating Kalaya Infantino/Extender: Jeri Cos ?Weeks in Treatment: 10 ?Encounter Discharge Information Items ?Discharge Condition: Stable ?Ambulatory Status: [name] ?Discharge Destination: Home ?Transportation: Private Auto ?Schedule Follow-up Appointment: Yes ?Clinical Summary of Care: ?Electronic Signature(s) ?Signed: 10/10/2021  3:17:22 PM By: Gretta Cool, BSN, RN, CWS, Kim RN, BSN ?Entered By: Gretta Cool, BSN, RN, CWS, Kim on 10/10/2021 01:02:72 ?[name], [name] ([id number]) ?-------------------------------------------------------------------------------- ?Lower Extremity Assessment Details ?Patient Name: [name], [name] ?Date of Service: 10/10/2021 11:45 AM ?Medical Record Number: [id number] ?Patient Account Number: [id number] ?Date of Birth/Sex: February 06, 1956 (66 y.o. F) ?Treating RN: Cornell Barman ?Primary Care Rayquan Amrhein: Ranae Plumber Other Clinician: ?Referring Masin Shatto: Ranae Plumber ?Treating Yaneliz Radebaugh/Extender: Jeri Cos ?Weeks in Treatment: 10 ?Edema Assessment ?Assessed: [Left: No] [Right: No] ?[Left: Edema] [Right: :] ?Calf ?Left: Right: ?Point of Measurement: 39 cm From Medial Instep ?Ankle ?Left: Right: ?Point of Measurement: 9 cm From Medial Instep 53 cm 50 cm ?Vascular Assessment ?Pulses: ?Dorsalis Pedis ?Palpable: [Left:Yes] [Right:Yes] ?Electronic Signature(s) ?Signed: 10/10/2021 3:17:22 PM By: Gretta Cool, BSN, RN, CWS, Kim RN, BSN ?Entered By: Gretta Cool, BSN, RN, CWS, Kim on 10/10/2021 12:07:04 ?[name], [name] ([id number]) ?-------------------------------------------------------------------------------- ?Multi Wound Chart Details ?Patient Name: LINNAEA, AHN ?Date of Service: 10/10/2021 11:45 AM ?Medical Record Number: [id number] ?Patient Account Number: [id number] ?Date of Birth/Sex: 01-10-1956 (66 y.o. F) ?Treating RN: Cornell Barman ?Primary Care Leliana Kontz: Ranae Plumber Other Clinician: ?Referring Montanna Mcbain: Ranae Plumber ?Treating Lujean Ebright/Extender: Jeri Cos ?Weeks in Treatment: 10 ?Vital Signs ?Height(in): 63 ?Pulse(bpm): 72 ?Weight(lbs): 372 ?Blood Pressure(mmHg): 112/55 ?Body Mass Index(BMI): 65.9 ?Temperature(??F): 97.8 ?Respiratory Rate(breaths/min): 16 ?Wound Assessments ?Treatment Notes ?Electronic Signature(s) ?Signed: 10/10/2021 3:17:22 PM By: Gretta Cool, BSN, RN, CWS, Kim RN, BSN ?Entered By: Gretta Cool, BSN, RN, CWS, Kim on 10/10/2021  12:14:57 ?CAMBER, NINH ([id number]) ?-------------------------------------------------------------------------------- ?Multi-Disciplinary Care Plan Details ?Patient Name: LAZARIA, SCHABEN ?Date of Service: 10/10/2021 1

## 2021-10-17 ENCOUNTER — Encounter: Payer: Medicare HMO | Admitting: Physician Assistant

## 2021-10-17 DIAGNOSIS — E11621 Type 2 diabetes mellitus with foot ulcer: Secondary | ICD-10-CM | POA: Diagnosis not present

## 2021-10-17 NOTE — Progress Notes (Addendum)
[name], [name] ([id number]) ?Visit Report for 10/17/2021 ?Chief Complaint Document Details ?Patient Name: [name], [name] ?Date of Service: 10/17/2021 10:30 AM ?Medical Record Number: [id number] ?Patient Account Number: [id number] ?Date of Birth/Sex: October 15, 1955 (66 y.o. F) ?Treating RN: [name] Dredge ?Primary Care Provider: Ranae Plumber Other Clinician: ?Referring Provider: Ranae Plumber ?Treating Provider/Extender: Jeri Cos ?Weeks in Treatment: 11 ?Information Obtained from: Patient ?Chief Complaint ?Bilateral LE lymphedema with Left LE Ulcers ?Electronic Signature(s) ?Signed: 10/17/2021 10:58:15 AM By: Worthy Keeler PA-C ?Entered By: Worthy Keeler on 10/17/2021 10:58:15 ?[name], [name] ([id number]) ?-------------------------------------------------------------------------------- ?Debridement Details ?Patient Name: [name], [name] ?Date of Service: 10/17/2021 10:30 AM ?Medical Record Number: [id number] ?Patient Account Number: [id number] ?Date of Birth/Sex: Nov 27, 1955 (66 y.o. F) ?Treating RN: [name] Dredge ?Primary Care Provider: Ranae Plumber Other Clinician: ?Referring Provider: Ranae Plumber ?Treating Provider/Extender: Jeri Cos ?Weeks in Treatment: 11 ?Debridement Performed for ?Wound #10 Left,Medial,Anterior Lower Leg ?Assessment: ?Performed By: Physician Tommie Sams., PA-C ?Debridement Type: Debridement ?Level of Consciousness (Pre- ?Awake and Alert ?procedure): ?Pre-procedure Verification/Time Out ?Yes - 11:23 ?Taken: ?Total Area Debrided (L x W): 2.5 (cm) x 4 (cm) = 10 (cm?) ?Tissue and other material ?Viable, Non-Viable, Slough, Subcutaneous, Biofilm, Slough ?debrided: ?Level: Skin/Subcutaneous Tissue ?Debridement Description: Excisional ?Instrument: Curette ?Bleeding: Minimum ?Hemostasis Achieved: Pressure ?Response to Treatment: Procedure was tolerated well ?Level of Consciousness (Post- ?Awake and Alert ?procedure): ?Post Debridement Measurements of Total Wound ?Length: (cm)  2.5 ?Width: (cm) 4 ?Depth: (cm) 0.1 ?Volume: (cm?) 0.785 ?Character of Wound/Ulcer Post Debridement: Stable ?Post Procedure Diagnosis ?Same as Pre-procedure ?Electronic Signature(s) ?Signed: 10/17/2021 4:51:41 PM By: [name] Dredge ?Signed: 10/17/2021 5:16:21 PM By: Worthy Keeler PA-C ?Entered By: [name] Dredge on 10/17/2021 11:25:40 ?[name], [name] ([id number]) ?-------------------------------------------------------------------------------- ?HPI Details ?Patient Name: [name], [name] ?Date of Service: 10/17/2021 10:30 AM ?Medical Record Number: [id number] ?Patient Account Number: [id number] ?Date of Birth/Sex: 08-Dec-1955 (66 y.o. F) ?Treating RN: [name] Dredge ?Primary Care Provider: Ranae Plumber Other Clinician: ?Referring Provider: Ranae Plumber ?Treating Provider/Extender: Jeri Cos ?Weeks in Treatment: 11 ?History of Present Illness ?HPI Description: The patient is a 66 year old female with history of hypertension and a long-standing history of bilateral lower extremity ?lymphedema (first presented on 4/2) . She has had open ulcers in the past which have always responded to compression therapy. She had briefly ?been to a lymphedema clinic in the past which helped her at the time. ?this time around she stopped treatment of her lymphedema pumps approximately 2 weeks ago because of some pain in the knees and then ?noticed the right leg getting worse. She was seen by her PCP who put her on clindamycin 4 times a day 2 days ago. ?The patient has seen [facility] and Dr. Delana Meyer had seen her last year where a vascular study including venous and arterial duplex studies were within ?normal limits. ?he had recommended compression stockings and lymphedema pumps and the patient has been using this in about 2 weeks ago. ?She is known to be diabetic but in the past few time she's gone to her primary care doctor her hemoglobin A1c has been normal. ?02/11/2015 - after her last visit she took my advice and went to the ER  regarding the progressive cellulitis of her right lower extremity and she ?was admitted between July 17 and 22nd. She received IV antibiotics and then was sent home on a course of steroid-induced and oral antibiotics. ?She has improved much since then. ?02/17/2015 -- she has been doing fine and  the weeping of her legs has remarkably gone down. She has no fresh issues. ?READMISSION ?01/15/18 ?This patient was given this clinic before most recently in 2016 seen by Dr. Con Memos. She has massive bilateral lymphedema and over the last 2 ?months this had weeping edema out of the left leg. She has compression pumps but her compliance with these has been minimal. She has ?[facility] they've been using TCA/ABDs/kerlix under an Ace wrap.she has had recent problems with cellulitis. She was apparently seen ?in the ER and 12/23/17 and given clindamycin. She was then followed by her primary doctor and given doxycycline and Keflex. The pain seems to ?have settled down. ?In April 2018 the patient had arterial studies done at [facility]. This showed triphasic waveforms throughout the right leg and ?mostly triphasic waveforms on the left except for monophasic at the posterior tibial artery distally. She was not felt to have evidence of right lower ?extremity arterial stenosis or significant problems on the left side. 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 She also had ?a right lower extremity venous Doppler in January 2018 this was limited by the patient's body habitus and lymphedema. Most of the proximal ?veins were not visualized ?The patient presents with an area of denuded skin on the anterior medial part of the left calf. There is weeping edema fluid here. ?01/22/18; the patient has somewhat better edema control using her compression pumps twice a day and as a result she has much better ?epithelialization on the left anterior calf area. Only a small open area remains. ?01/29/18; the  patient has been compliant with her compression pumps. Both the areas on her calf that healed. The remaining area on the left ?anterior leg is fully epithelialized ?Readmission: ?02/20/2019 upon evaluation today patient presents for reevaluation due to issues that she is having with the bilateral lower extremities. She ?actually has wounds open on both legs. On the right she has an area in the crease of her leg on the right around the knee region which is actually ?draining quite a bit and actually has some fungal type appearance to it. She has been on nystatin powder that seems to have helped to some ?degree. In regard to the left lower extremity this is actually in the lower portion of her leg closer to the ankle and again is continuing to drain as ?well unfortunately. There does not appear to be any signs of active infection at this time which is good news. No fevers, chills, nausea, vomiting, ?or diarrhea. She tells me that since she was seen last year she is actually been doing quite well for the most part with regard to her lower ?extremities. Unfortunately she now is experiencing a little bit more drainage at this time. She is concerned about getting this under control so that ?it does not get significantly worse. ?02/27/2019 on evaluation today patient appears to be doing somewhat better in regard to her bilateral lower extremity wounds. She has been ?tolerating the dressing changes without complication. Fortunately there is no signs of active infection at this point. No fevers, chills, nausea, ?vomiting, or diarrhea. She did get her dressing supplies which is excellent news she was extremely excited to get these. She also got paperwork ?from [employer] for their financial assistance program where they may be able to help her out in the future if needed with supplies at discounted ?prices. ?03/06/2019 on evaluation today patient appears to be doing a little worse with regard to  both areas of weeping on her bilateral  lower extremities. ?This is around the right medial knee and just above the left ankle. With that being said she is unfortunately not doing as well as I would like to ?see. I feel like she may need to p

## 2021-10-17 NOTE — Progress Notes (Signed)
[name], [name] ([id number]) ?Visit Report for 10/17/2021 ?Arrival Information Details ?Patient Name: [name], [name] ?Date of Service: 10/17/2021 10:30 AM ?Medical Record Number: [id number] ?Patient Account Number: [id number] ?Date of Birth/Sex: August 15, 1955 (66 y.o. F) ?Treating RN: [name] ?Primary Care [name]: [name] Other Clinician: ?Referring [name]: [name] ?Treating Jisell Majer/Extender: [name] ?Weeks in Treatment: 11 ?Visit Information History Since Last Visit ?Added or deleted any medications: No ?Patient Arrived: [name] ?Any new allergies or adverse reactions: No ?Arrival Time: 10:39 ?Had a fall or experienced change in No ?Accompanied By: self ?activities of daily living that may affect ?Transfer Assistance: None ?risk of falls: ?Patient Identification Verified: Yes ?Hospitalized since last visit: No ?Secondary Verification Process Completed: Yes ?Has Dressing in Place as Prescribed: Yes ?Pain Present Now: No ?Electronic Signature(s) ?Signed: 10/17/2021 4:51:41 PM By: [name] ?Entered By: [name] on 10/17/2021 10:43:00 ?[name], [name] ([id number]) ?-------------------------------------------------------------------------------- ?Clinic Level of Care Assessment Details ?Patient Name: [name] ?Date of Service: 10/17/2021 10:30 AM ?Medical Record Number: [id number] ?Patient Account Number: [id number] ?Date of Birth/Sex: [birth year] (66 y.o. F) ?Treating RN: [name] ?Primary Care Jahmez Bily: [name] Other Clinician: ?Referring [name]: [name] ?Treating [name]/Extender: [name] ?Weeks in Treatment: 11 ?Clinic Level of Care Assessment Items ?TOOL 1 Quantity Score ?'[]'$  - Use when EandM and Procedure is performed on INITIAL visit 0 ?ASSESSMENTS - Nursing Assessment / Reassessment ?'[]'$  - General Physical Exam (combine w/ comprehensive assessment (listed just below) when performed on new ?0 ?pt. evals) ?'[]'$  - 0 ?Comprehensive Assessment (HX,  ROS, Risk Assessments, Wounds Hx, etc.) ?ASSESSMENTS - Wound and Skin Assessment / Reassessment ?'[]'$  - Dermatologic / Skin Assessment (not related to wound area) 0 ?ASSESSMENTS - Ostomy and/or Continence Assessment and Care ?'[]'$  - Incontinence Assessment and Management 0 ?'[]'$  - 0 ?Ostomy Care Assessment and Management (repouching, etc.) ?PROCESS - Coordination of Care ?'[]'$  - Simple Patient / Family Education for ongoing care 0 ?'[]'$  - 0 ?Complex (extensive) Patient / Family Education for ongoing care ?'[]'$  - 0 ?Staff obtains Consents, Records, Test Results / Process Orders ?'[]'$  - 0 ?Staff telephones HHA, Nursing Homes / Clarify orders / etc ?'[]'$  - 0 ?Routine Transfer to another Facility (non-emergent condition) ?'[]'$  - 0 ?Routine Hospital Admission (non-emergent condition) ?'[]'$  - 0 ?New Admissions / Biomedical engineer / Ordering NPWT, Apligraf, etc. ?'[]'$  - 0 ?Emergency Hospital Admission (emergent condition) ?PROCESS - Special Needs ?'[]'$  - Pediatric / Minor Patient Management 0 ?'[]'$  - 0 ?Isolation Patient Management ?'[]'$  - 0 ?Hearing / Language / Visual special needs ?'[]'$  - 0 ?Assessment of Community assistance (transportation, D/C planning, etc.) ?'[]'$  - 0 ?Additional assistance / Altered mentation ?'[]'$  - 0 ?Support Surface(s) Assessment (bed, cushion, seat, etc.) ?INTERVENTIONS - Miscellaneous ?'[]'$  - External ear exam 0 ?'[]'$  - 0 ?Patient Transfer (multiple staff / Civil Service fast streamer / Similar devices) ?'[]'$  - 0 ?Simple Staple / Suture removal (25 or less) ?'[]'$  - 0 ?Complex Staple / Suture removal (26 or more) ?'[]'$  - 0 ?Hypo/Hyperglycemic Management (do not check if billed separately) ?'[]'$  - 0 ?Ankle / Brachial Index (ABI) - do not check if billed separately ?Has the patient been seen at the hospital within the last three years: Yes ?Total Score: 0 ?Level Of Care: ____ ?[name] ([id number]) ?Electronic Signature(s) ?Signed: 10/17/2021 4:51:41 PM By: [name] ?Entered By: [name] on 10/17/2021 14:17:57 ?[name], [name]  ([id number]) ?-------------------------------------------------------------------------------- ?Encounter Discharge Information Details ?Patient Name: [name], [name] ?Date of Service: 10/17/2021 10:30  AM ?Medical Record Number: [id number] ?Patient Account Number: [id number] ?Date of Birth/Sex: March 07, 1956 (66 y.o. F) ?Treating RN: [name] ?Primary Care [name]: [name] Other Clinician: ?Referring [name]: [name] ?Treating [name]/Extender: [name] ?Weeks in Treatment: 11 ?Encounter Discharge Information Items Post Procedure Vitals ?Discharge Condition: Stable ?Temperature (?F): 97.8 ?Ambulatory Status: [name] ?Pulse (bpm): 80 ?Discharge Destination: Home ?Respiratory Rate (breaths/min): 18 ?Transportation: Other ?Blood Pressure (mmHg): 124/79 ?Accompanied By: self ?Schedule Follow-up Appointment: Yes ?Clinical Summary of Care: Patient Declined ?Electronic Signature(s) ?Signed: 10/17/2021 2:20:36 PM By: [name] ?Entered By: [name] on 10/17/2021 14:20:36 ?[name], [name] ([id number]) ?-------------------------------------------------------------------------------- ?Lower Extremity Assessment Details ?Patient Name: [name], [name] ?Date of Service: 10/17/2021 10:30 AM ?Medical Record Number: [id number] ?Patient Account Number: [id number] ?Date of Birth/Sex: [birth year] (66 y.o. F) ?Treating RN: [name] ?Primary Care Nykayla Marcelli: [name] Other Clinician: ?Referring [name]: [name] ?Treating [name]/Extender: [name] ?Weeks in Treatment: 11 ?Edema Assessment ?Assessed: [Left: No] [Right: No] ?Edema: [Left: Yes] [Right: Yes] ?Calf ?Left: Right: ?Point of Measurement: 39 cm From Medial Instep 82 cm 91 cm ?Ankle ?Left: Right: ?Point of Measurement: 9 cm From Medial Instep 53.5 cm 55.4 cm ?Vascular Assessment ?Pulses: ?Dorsalis Pedis ?Palpable: [Left:Yes] [Right:Yes] ?Electronic Signature(s) ?Signed: 10/17/2021 4:51:41 PM By: [name] ?Entered By:  [name] on 10/17/2021 11:01:35 ?[name], [name] ([id number]) ?-------------------------------------------------------------------------------- ?Multi Wound Chart Details ?Patient Name: [name], [name] ?Date of Service: 10/17/2021 10:30 AM ?Medical Record Number: [id number] ?Patient Account Number: [id number] ?Date of Birth/Sex: 04/02/1956 (66 y.o. F) ?Treating RN: [name] ?Primary Care Fredricka Kohrs: [name] Other Clinician: ?Referring Talene Glastetter: [name] ?Treating Vala Raffo/Extender: [name] ?Weeks in Treatment: 11 ?Vital Signs ?Height(in): 63 ?Pulse(bpm): 80 ?Weight(lbs): 372 ?Blood Pressure(mmHg): 124/79 ?Body Mass Index(BMI): 65.9 ?Temperature(??F): 97.8 ?Respiratory Rate(breaths/min): 18 ?Photos: [N/A:N/A] ?Wound Location: Left, Medial, Anterior Lower Leg N/A N/A ?Wounding Event: Gradually Appeared N/A N/A ?Primary Etiology: Lymphedema N/A N/A ?Comorbid History: Cataracts, Chronic sinus N/A N/A ?problems/congestion, Anemia, ?Lymphedema, Asthma, Sleep ?Apnea, Tuberculosis, Angina, ?Hypertension, Gout, Osteoarthritis ?Date Acquired: 10/17/2021 N/A N/A ?Weeks of Treatment: 0 N/A N/A ?Wound Status: Open N/A N/A ?Wound Recurrence: No N/A N/A ?Measurements L x W x D (cm) 2.5x4x0.1 N/A N/A ?Area (cm?) : 7.854 N/A N/A ?Volume (cm?) : 0.785 N/A N/A ?Classification: Full Thickness Without Exposed N/A N/A ?Support Structures ?Exudate Amount: Medium N/A N/A ?Exudate Type: Serosanguineous N/A N/A ?Exudate Color: red, brown N/A N/A ?Granulation Amount: Medium (34-66%) N/A N/A ?Granulation Quality: Red N/A N/A ?Necrotic Amount: Medium (34-66%) N/A N/A ?Exposed Structures: ?Fat Layer (Subcutaneous Tissue): N/A N/A ?Yes ?Epithelialization: None N/A N/A ?Treatment Notes ?Electronic Signature(s) ?Signed: 10/17/2021 4:51:41 PM By: [name] ?Entered By: [name] on 10/17/2021 11:24:18 ?[name], [name]  ([id number]) ?-------------------------------------------------------------------------------- ?Multi-Disciplinary Care Plan Details ?Patient Name: [name], [name] ?Date of Service: 10/17/2021 10:30 AM ?Medical Record Number: [id number] ?Patient Account Number: [id number] ?Date of Birth/Sex: 2[birth year]2

## 2021-10-23 ENCOUNTER — Encounter: Payer: Medicare HMO | Admitting: Physician Assistant

## 2021-10-24 ENCOUNTER — Encounter: Payer: Medicare HMO | Admitting: Physician Assistant

## 2021-10-24 DIAGNOSIS — E11621 Type 2 diabetes mellitus with foot ulcer: Secondary | ICD-10-CM | POA: Diagnosis not present

## 2021-10-24 NOTE — Progress Notes (Addendum)
[name], [name] ([id number]) ?Visit Report for 10/24/2021 ?Chief Complaint Document Details ?Patient Name: [name], [name] ?Date of Service: 10/24/2021 10:30 AM ?Medical Record Number: [id number] ?Patient Account Number: [id number] ?Date of Birth/Sex: Sep 09, 1955 (66 y.o. F) ?Treating RN: [name] Dredge ?Primary Care Provider: Ranae Plumber Other Clinician: ?Referring Provider: Ranae Plumber ?Treating Provider/Extender: Jeri Cos ?Weeks in Treatment: 12 ?Information Obtained from: Patient ?Chief Complaint ?Bilateral LE lymphedema with Left LE Ulcers ?Electronic Signature(s) ?Signed: 10/24/2021 10:47:47 AM By: Worthy Keeler PA-C ?Entered By: Worthy Keeler on 10/24/2021 10:47:47 ?MYLEI, BRACKEEN ([id number]) ?-------------------------------------------------------------------------------- ?Debridement Details ?Patient Name: [name], [name] ?Date of Service: 10/24/2021 10:30 AM ?Medical Record Number: [id number] ?Patient Account Number: [id number] ?Date of Birth/Sex: 17-Jan-1956 (66 y.o. F) ?Treating RN: [name] Dredge ?Primary Care Provider: Ranae Plumber Other Clinician: ?Referring Provider: Ranae Plumber ?Treating Provider/Extender: Jeri Cos ?Weeks in Treatment: 12 ?Debridement Performed for ?Wound #10 Left,Medial,Anterior Lower Leg ?Assessment: ?Performed By: Physician Tommie Sams., PA-C ?Debridement Type: Debridement ?Level of Consciousness (Pre- ?Awake and Alert ?procedure): ?Pre-procedure Verification/Time Out ?Yes - 11:22 ?Taken: ?Total Area Debrided (L x W): 2.8 (cm) x 3.5 (cm) = 9.8 (cm?) ?Tissue and other material ?Viable, Non-Viable, Slough, Subcutaneous, Harold ?debrided: ?Level: Skin/Subcutaneous Tissue ?Debridement Description: Excisional ?Instrument: Curette ?Bleeding: Minimum ?Hemostasis Achieved: Pressure ?Response to Treatment: Procedure was tolerated well ?Level of Consciousness (Post- ?Awake and Alert ?procedure): ?Post Debridement Measurements of Total Wound ?Length: (cm)  2.8 ?Width: (cm) 3.5 ?Depth: (cm) 0.1 ?Volume: (cm?) 0.77 ?Character of Wound/Ulcer Post Debridement: Stable ?Post Procedure Diagnosis ?Same as Pre-procedure ?Electronic Signature(s) ?Signed: 10/24/2021 4:40:46 PM By: [name] Dredge ?Signed: 10/24/2021 5:00:17 PM By: Worthy Keeler PA-C ?Entered By: [name] Dredge on 10/24/2021 11:23:43 ?[name], [name] ([id number]) ?-------------------------------------------------------------------------------- ?HPI Details ?Patient Name: [name], [name] ?Date of Service: 10/24/2021 10:30 AM ?Medical Record Number: [id number] ?Patient Account Number: [id number] ?Date of Birth/Sex: Jun 17, 1956 (66 y.o. F) ?Treating RN: [name] Dredge ?Primary Care Provider: Ranae Plumber Other Clinician: ?Referring Provider: Ranae Plumber ?Treating Provider/Extender: Jeri Cos ?Weeks in Treatment: 12 ?History of Present Illness ?HPI Description: The patient is a 66 year old female with history of hypertension and a long-standing history of bilateral lower extremity ?lymphedema (first presented on 4/2) . She has had open ulcers in the past which have always responded to compression therapy. She had briefly ?been to a lymphedema clinic in the past which helped her at the time. ?this time around she stopped treatment of her lymphedema pumps approximately 2 weeks ago because of some pain in the knees and then ?noticed the right leg getting worse. She was seen by her PCP who put her on clindamycin 4 times a day 2 days ago. ?The patient has seen [facility] and Dr. Delana Meyer had seen her last year where a vascular study including venous and arterial duplex studies were within ?normal limits. ?he had recommended compression stockings and lymphedema pumps and the patient has been using this in about 2 weeks ago. ?She is known to be diabetic but in the past few time she's gone to her primary care doctor her hemoglobin A1c has been normal. ?02/11/2015 - after her last visit she took my advice and went to the ER  regarding the progressive cellulitis of her right lower extremity and she ?was admitted between July 17 and 22nd. She received IV antibiotics and then was sent home on a course of steroid-induced and oral antibiotics. ?She has improved much since then. ?02/17/2015 -- she has been doing fine and the  weeping of her legs has remarkably gone down. She has no fresh issues. ?READMISSION ?01/15/18 ?This patient was given this clinic before most recently in 2016 seen by Dr. Con Memos. She has massive bilateral lymphedema and over the last 2 ?months this had weeping edema out of the left leg. She has compression pumps but her compliance with these has been minimal. She has [facility] they've been using TCA/ABDs/kerlix under an Ace wrap.she has had recent problems with cellulitis. She was apparently seen ?in the ER and 12/23/17 and given clindamycin. She was then followed by her primary doctor and given doxycycline and Keflex. The pain seems to ?have settled down. ?In April 2018 the patient had arterial studies done at [facility]. This showed triphasic waveforms throughout the right leg and ?mostly triphasic waveforms on the left except for monophasic at the posterior tibial artery distally. She was not felt to have evidence of right lower ?extremity arterial stenosis or significant problems on the left side. 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 She also had ?a right lower extremity venous Doppler in January 2018 this was limited by the patient's body habitus and lymphedema. Most of the proximal ?veins were not visualized ?The patient presents with an area of denuded skin on the anterior medial part of the left calf. There is weeping edema fluid here. ?01/22/18; the patient has somewhat better edema control using her compression pumps twice a day and as a result she has much better ?epithelialization on the left anterior calf area. Only a small open area remains. ?01/29/18; the  patient has been compliant with her compression pumps. Both the areas on her calf that healed. The remaining area on the left ?anterior leg is fully epithelialized ?Readmission: ?02/20/2019 upon evaluation today patient presents for reevaluation due to issues that she is having with the bilateral lower extremities. She ?actually has wounds open on both legs. On the right she has an area in the crease of her leg on the right around the knee region which is actually ?draining quite a bit and actually has some fungal type appearance to it. She has been on nystatin powder that seems to have helped to some ?degree. In regard to the left lower extremity this is actually in the lower portion of her leg closer to the ankle and again is continuing to drain as ?well unfortunately. There does not appear to be any signs of active infection at this time which is good news. No fevers, chills, nausea, vomiting, ?or diarrhea. She tells me that since she was seen last year she is actually been doing quite well for the most part with regard to her lower ?extremities. Unfortunately she now is experiencing a little bit more drainage at this time. She is concerned about getting this under control so that ?it does not get significantly worse. ?02/27/2019 on evaluation today patient appears to be doing somewhat better in regard to her bilateral lower extremity wounds. She has been ?tolerating the dressing changes without complication. Fortunately there is no signs of active infection at this point. No fevers, chills, nausea, ?vomiting, or diarrhea. She did get her dressing supplies which is excellent news she was extremely excited to get these. She also got paperwork ?from [employer] for their financial assistance program where they may be able to help her out in the future if needed with supplies at discounted ?prices. ?03/06/2019 on evaluation today patient appears to be doing a little worse with regard to both  areas of weeping on her bilateral  lower extremities. ?This is around the right medial knee and just above the left ankle. With that being said she is unfortunately not doing as well as I would like to ?see. I feel like she may need to potent

## 2021-10-24 NOTE — Progress Notes (Signed)
[name] ([id number]) ?Visit Report for 10/24/2021 ?Arrival Information Details ?Patient Name: [name], [name] ?Date of Service: 10/24/2021 10:30 AM ?Medical Record Number: [id number] ?Patient Account Number: [id number] ?Date of Birth/Sex: 1955-07-22 (66 y.o. F) ?Treating RN: [name] Dredge ?Primary Care Suezette Lafave: Ranae Plumber Other Clinician: ?Referring Lexianna Weinrich: Ranae Plumber ?Treating Brion Hedges/Extender: Jeri Cos ?Weeks in Treatment: 12 ?Visit Information History Since Last Visit ?Added or deleted any medications: No ?Patient Arrived: [name] ?Any new allergies or adverse reactions: No ?Arrival Time: 10:32 ?Had a fall or experienced change in No ?Accompanied By: self ?activities of daily living that may affect ?Transfer Assistance: None ?risk of falls: ?Patient Identification Verified: Yes ?Hospitalized since last visit: No ?Secondary Verification Process Completed: Yes ?Has Dressing in Place as Prescribed: Yes ?Pain Present Now: No ?Electronic Signature(s) ?Signed: 10/24/2021 4:40:46 PM By: [name] Dredge ?Entered By: [name] Dredge on 10/24/2021 10:33:22 ?[name], [name] ([id number]) ?-------------------------------------------------------------------------------- ?Clinic Level of Care Assessment Details ?Patient Name: [name] ?Date of Service: 10/24/2021 10:30 AM ?Medical Record Number: [id number] ?Patient Account Number: [id number] ?Date of Birth/Sex: 1955-08-17 (66 y.o. F) ?Treating RN: [name] Dredge ?Primary Care Bryahna Lesko: Ranae Plumber Other Clinician: ?Referring Zerenity Bowron: Ranae Plumber ?Treating Burman Bruington/Extender: Jeri Cos ?Weeks in Treatment: 12 ?Clinic Level of Care Assessment Items ?TOOL 1 Quantity Score ?'[]'$  - Use when EandM and Procedure is performed on INITIAL visit 0 ?ASSESSMENTS - Nursing Assessment / Reassessment ?'[]'$  - General Physical Exam (combine w/ comprehensive assessment (listed just below) when performed on new ?0 ?pt. evals) ?'[]'$  - 0 ?Comprehensive Assessment  (HX, ROS, Risk Assessments, Wounds Hx, etc.) ?ASSESSMENTS - Wound and Skin Assessment / Reassessment ?'[]'$  - Dermatologic / Skin Assessment (not related to wound area) 0 ?ASSESSMENTS - Ostomy and/or Continence Assessment and Care ?'[]'$  - Incontinence Assessment and Management 0 ?'[]'$  - 0 ?Ostomy Care Assessment and Management (repouching, etc.) ?PROCESS - Coordination of Care ?'[]'$  - Simple Patient / Family Education for ongoing care 0 ?'[]'$  - 0 ?Complex (extensive) Patient / Family Education for ongoing care ?'[]'$  - 0 ?Staff obtains Consents, Records, Test Results / Process Orders ?'[]'$  - 0 ?Staff telephones HHA, Nursing Homes / Clarify orders / etc ?'[]'$  - 0 ?Routine Transfer to another Facility (non-emergent condition) ?'[]'$  - 0 ?Routine Hospital Admission (non-emergent condition) ?'[]'$  - 0 ?New Admissions / Biomedical engineer / Ordering NPWT, Apligraf, etc. ?'[]'$  - 0 ?Emergency Hospital Admission (emergent condition) ?PROCESS - Special Needs ?'[]'$  - Pediatric / Minor Patient Management 0 ?'[]'$  - 0 ?Isolation Patient Management ?'[]'$  - 0 ?Hearing / Language / Visual special needs ?'[]'$  - 0 ?Assessment of Community assistance (transportation, D/C planning, etc.) ?'[]'$  - 0 ?Additional assistance / Altered mentation ?'[]'$  - 0 ?Support Surface(s) Assessment (bed, cushion, seat, etc.) ?INTERVENTIONS - Miscellaneous ?'[]'$  - External ear exam 0 ?'[]'$  - 0 ?Patient Transfer (multiple staff / Civil Service fast streamer / Similar devices) ?'[]'$  - 0 ?Simple Staple / Suture removal (25 or less) ?'[]'$  - 0 ?Complex Staple / Suture removal (26 or more) ?'[]'$  - 0 ?Hypo/Hyperglycemic Management (do not check if billed separately) ?'[]'$  - 0 ?Ankle / Brachial Index (ABI) - do not check if billed separately ?Has the patient been seen at the hospital within the last three years: Yes ?Total Score: 0 ?Level Of Care: ____ ?[name], [name] ([id number]) ?Electronic Signature(s) ?Signed: 10/24/2021 4:40:46 PM By: [name] Dredge ?Entered By: [name] Dredge on 10/24/2021 11:35:05 ?[name], [name] ([id number]) ?-------------------------------------------------------------------------------- ?Encounter Discharge Information Details ?Patient Name: [name], [name] ?Date of Service: 10/24/2021 10:30  AM ?Medical Record Number: [id number] ?Patient Account Number: [id number] ?Date of Birth/Sex: Apr 10, 1956 (66 y.o. F) ?Treating RN: [name] Dredge ?Primary Care Ronnae Kaser: Ranae Plumber Other Clinician: ?Referring Desare Duddy: Ranae Plumber ?Treating Corabelle Spackman/Extender: Jeri Cos ?Weeks in Treatment: 12 ?Encounter Discharge Information Items Post Procedure Vitals ?Discharge Condition: Stable ?Temperature (?F): 97.8 ?Ambulatory Status: [name] ?Pulse (bpm): 69 ?Discharge Destination: Home ?Respiratory Rate (breaths/min): 18 ?Transportation: Other ?Blood Pressure (mmHg): 114/81 ?Accompanied By: self ?Schedule Follow-up Appointment: Yes ?Clinical Summary of Care: Patient Declined ?Electronic Signature(s) ?Signed: 10/24/2021 4:21:41 PM By: [name] Dredge ?Entered By: [name] Dredge on 10/24/2021 16:21:41 ?[name], [name] ([id number]) ?-------------------------------------------------------------------------------- ?Lower Extremity Assessment Details ?Patient Name: [name], [name] ?Date of Service: 10/24/2021 10:30 AM ?Medical Record Number: [id number] ?Patient Account Number: [id number] ?Date of Birth/Sex: Feb 21, 1956 (66 y.o. F) ?Treating RN: [name] Dredge ?Primary Care Deairra Halleck: Ranae Plumber Other Clinician: ?Referring Payslee Bateson: Ranae Plumber ?Treating Trula Frede/Extender: Jeri Cos ?Weeks in Treatment: 12 ?Edema Assessment ?Assessed: [Left: No] [Right: No] ?Edema: [Left: Yes] [Right: Yes] ?Calf ?Left: Right: ?Point of Measurement: 39 cm From Medial Instep 79 cm 79 cm ?Ankle ?Left: Right: ?Point of Measurement: 9 cm From Medial Instep 53.4 cm 53.3 cm ?Vascular Assessment ?Pulses: ?Dorsalis Pedis ?Palpable: [Left:Yes] [Right:Yes] ?Electronic Signature(s) ?Signed: 10/24/2021 4:40:46 PM By: [name] Dredge ?Entered  By: [name] Dredge on 10/24/2021 10:49:52 ?[name], [name] ([id number]) ?-------------------------------------------------------------------------------- ?Multi Wound Chart Details ?Patient Name: [name], [name] ?Date of Service: 10/24/2021 10:30 AM ?Medical Record Number: [id number] ?Patient Account Number: [id number] ?Date of Birth/Sex: [birth year] (66 y.o. F) ?Treating RN: [name] Dredge ?Primary Care Jennine Peddy: Ranae Plumber Other Clinician: ?Referring Manahil Vanzile: Ranae Plumber ?Treating Yichen Gilardi/Extender: Jeri Cos ?Weeks in Treatment: 12 ?Vital Signs ?Height(in): 63 ?Pulse(bpm): 69 ?Weight(lbs): 372 ?Blood Pressure(mmHg): 114/81 ?Body Mass Index(BMI): 65.9 ?Temperature(??F): 97.8 ?Respiratory Rate(breaths/min): 18 ?Photos: [N/A:N/A] ?Wound Location: Left, Medial, Anterior Lower Leg N/A N/A ?Wounding Event: Gradually Appeared N/A N/A ?Primary Etiology: Lymphedema N/A N/A ?Comorbid History: Cataracts, Chronic sinus N/A N/A ?problems/congestion, Anemia, ?Lymphedema, Asthma, Sleep ?Apnea, Tuberculosis, Angina, ?Hypertension, Gout, Osteoarthritis ?Date Acquired: 10/17/2021 N/A N/A ?Weeks of Treatment: 1 N/A N/A ?Wound Status: Open N/A N/A ?Wound Recurrence: No N/A N/A ?Measurements L x W x D (cm) 2.8x3.5x0.1 N/A N/A ?Area (cm?) : 7.697 N/A N/A ?Volume (cm?) : 0.77 N/A N/A ?% Reduction in Area: 2.00% N/A N/A ?% Reduction in Volume: 1.90% N/A N/A ?Classification: Full Thickness Without Exposed N/A N/A ?Support Structures ?Exudate Amount: Medium N/A N/A ?Exudate Type: Serosanguineous N/A N/A ?Exudate Color: red, brown N/A N/A ?Granulation Amount: Small (1-33%) N/A N/A ?Granulation Quality: Red N/A N/A ?Necrotic Amount: Medium (34-66%) N/A N/A ?Exposed Structures: ?Fat Layer (Subcutaneous Tissue): N/A N/A ?Yes ?Epithelialization: None N/A N/A ?Treatment Notes ?Electronic Signature(s) ?Signed: 10/24/2021 4:40:46 PM By: [name] Dredge ?Entered By: [name] Dredge on 10/24/2021 10:52:22 ?[name], [name]  ([id number]) ?-------------------------------------------------------------------------------- ?Multi-Disciplinary Care Plan Details ?Patient Name: [name], CIAMPA ?Date of Service: 10/24/2021 10:30 AM ?Medical Record

## 2021-10-30 ENCOUNTER — Encounter: Payer: Medicare HMO | Admitting: Physician Assistant

## 2021-10-31 ENCOUNTER — Emergency Department: Payer: Medicare HMO

## 2021-10-31 ENCOUNTER — Encounter: Payer: Self-pay | Admitting: Internal Medicine

## 2021-10-31 ENCOUNTER — Inpatient Hospital Stay
Admission: EM | Admit: 2021-10-31 | Discharge: 2021-11-06 | DRG: 291 | Disposition: A | Discharge status: Home / Self Care | Payer: Medicare HMO | Attending: Hospitalist | Admitting: Hospitalist

## 2021-10-31 ENCOUNTER — Emergency Department: Payer: Medicare HMO | Admitting: Physician Assistant

## 2021-10-31 ENCOUNTER — Other Ambulatory Visit: Payer: Self-pay

## 2021-10-31 DIAGNOSIS — I130 Hypertensive heart and chronic kidney disease with heart failure and stage 1 through stage 4 chronic kidney disease, or unspecified chronic kidney disease: Principal | ICD-10-CM | POA: Diagnosis present

## 2021-10-31 DIAGNOSIS — J45909 Unspecified asthma, uncomplicated: Secondary | ICD-10-CM | POA: Diagnosis present

## 2021-10-31 DIAGNOSIS — L97811 Non-pressure chronic ulcer of other part of right lower leg limited to breakdown of skin: Secondary | ICD-10-CM | POA: Diagnosis present

## 2021-10-31 DIAGNOSIS — E1122 Type 2 diabetes mellitus with diabetic chronic kidney disease: Secondary | ICD-10-CM | POA: Diagnosis present

## 2021-10-31 DIAGNOSIS — Z720 Tobacco use: Secondary | ICD-10-CM | POA: Diagnosis present

## 2021-10-31 DIAGNOSIS — N1831 Chronic kidney disease, stage 3a: Secondary | ICD-10-CM | POA: Diagnosis present

## 2021-10-31 DIAGNOSIS — I10 Essential (primary) hypertension: Secondary | ICD-10-CM | POA: Diagnosis present

## 2021-10-31 DIAGNOSIS — I890 Lymphedema, not elsewhere classified: Secondary | ICD-10-CM | POA: Diagnosis present

## 2021-10-31 DIAGNOSIS — I5033 Acute on chronic diastolic (congestive) heart failure: Secondary | ICD-10-CM | POA: Diagnosis not present

## 2021-10-31 DIAGNOSIS — F17220 Nicotine dependence, chewing tobacco, uncomplicated: Secondary | ICD-10-CM | POA: Diagnosis present

## 2021-10-31 DIAGNOSIS — Z6841 Body Mass Index (BMI) 40.0 and over, adult: Secondary | ICD-10-CM

## 2021-10-31 DIAGNOSIS — Z881 Allergy status to other antibiotic agents status: Secondary | ICD-10-CM

## 2021-10-31 DIAGNOSIS — Z79899 Other long term (current) drug therapy: Secondary | ICD-10-CM

## 2021-10-31 DIAGNOSIS — Z886 Allergy status to analgesic agent status: Secondary | ICD-10-CM

## 2021-10-31 DIAGNOSIS — Z888 Allergy status to other drugs, medicaments and biological substances status: Secondary | ICD-10-CM

## 2021-10-31 DIAGNOSIS — L97821 Non-pressure chronic ulcer of other part of left lower leg limited to breakdown of skin: Secondary | ICD-10-CM | POA: Diagnosis present

## 2021-10-31 DIAGNOSIS — E11621 Type 2 diabetes mellitus with foot ulcer: Secondary | ICD-10-CM | POA: Diagnosis present

## 2021-10-31 DIAGNOSIS — G4733 Obstructive sleep apnea (adult) (pediatric): Secondary | ICD-10-CM | POA: Diagnosis present

## 2021-10-31 LAB — TROPONIN I (HIGH SENSITIVITY)
Troponin I (High Sensitivity): 6 ng/L (ref <18)
Troponin I (High Sensitivity): 5 ng/L (ref <18)

## 2021-10-31 LAB — CBC
HCT: 37.4 % (ref 36.0–46.0)
Hemoglobin: 11.6 g/dL — ABNORMAL LOW (ref 12.0–15.0)
MCH: 26.4 pg (ref 26.0–34.0)
MCHC: 31 g/dL (ref 30.0–36.0)
MCV: 85.2 fL (ref 80.0–100.0)
Platelets: 188 10*3/uL (ref 150–400)
RBC: 4.39 MIL/uL (ref 3.87–5.11)
RDW: 15 % (ref 11.5–15.5)
WBC: 7.4 10*3/uL (ref 4.0–10.5)
nRBC: 0 % (ref 0.0–0.2)

## 2021-10-31 LAB — COMPREHENSIVE METABOLIC PANEL
ALT: 18 U/L (ref 0–44)
AST: 24 U/L (ref 15–41)
Albumin: 3.4 g/dL — ABNORMAL LOW (ref 3.5–5.0)
Alkaline Phosphatase: 135 U/L — ABNORMAL HIGH (ref 38–126)
Anion gap: 11 (ref 5–15)
BUN: 21 mg/dL (ref 8–23)
CO2: 25 mmol/L (ref 22–32)
Calcium: 9.1 mg/dL (ref 8.9–10.3)
Chloride: 104 mmol/L (ref 98–111)
Creatinine, Ser: 1.04 mg/dL — ABNORMAL HIGH (ref 0.44–1.00)
GFR, Estimated: 59 mL/min — ABNORMAL LOW (ref >60)
Glucose, Bld: 81 mg/dL (ref 70–99)
Potassium: 3.9 mmol/L (ref 3.5–5.1)
Sodium: 140 mmol/L (ref 135–145)
Total Bilirubin: 0.6 mg/dL (ref 0.3–1.2)
Total Protein: 8.9 g/dL — ABNORMAL HIGH (ref 6.5–8.1)

## 2021-10-31 LAB — BRAIN NATRIURETIC PEPTIDE: B Natriuretic Peptide: 123.9 pg/mL — ABNORMAL HIGH (ref 0.0–100.0)

## 2021-10-31 MED ORDER — ACETAMINOPHEN 500 MG PO TABS
500.0000 mg | ORAL_TABLET | Freq: Four times a day (QID) | ORAL | Status: DC | PRN
  Administered 2021-10-31: 500 mg via ORAL
  Administered 2021-11-01: 1000 mg via ORAL
  Filled 2021-10-31: qty 2
  Filled 2021-10-31: qty 1

## 2021-10-31 MED ORDER — ONDANSETRON HCL 4 MG/2ML IJ SOLN
4.0000 mg | Freq: Four times a day (QID) | INTRAMUSCULAR | Status: DC | PRN
  Administered 2021-11-05: 4 mg via INTRAVENOUS
  Filled 2021-10-31: qty 2

## 2021-10-31 MED ORDER — ATENOLOL 50 MG PO TABS
50.0000 mg | ORAL_TABLET | Freq: Every day | ORAL | Status: DC
  Administered 2021-10-31 – 2021-11-01 (×2): 50 mg via ORAL
  Filled 2021-10-31 (×2): qty 1

## 2021-10-31 MED ORDER — ONDANSETRON HCL 4 MG PO TABS: 4.0000 mg | ORAL_TABLET | Freq: Four times a day (QID) | ORAL | Status: DC | PRN

## 2021-10-31 MED ORDER — FUROSEMIDE 10 MG/ML IJ SOLN
40.0000 mg | Freq: Two times a day (BID) | INTRAMUSCULAR | Status: DC
  Administered 2021-10-31 – 2021-11-01 (×2): 40 mg via INTRAVENOUS
  Filled 2021-10-31 (×2): qty 4

## 2021-10-31 MED ORDER — ENOXAPARIN SODIUM 100 MG/ML IJ SOSY
0.5000 mg/kg | PREFILLED_SYRINGE | Freq: Every day | INTRAMUSCULAR | Status: DC
  Administered 2021-11-01 – 2021-11-03 (×3): 95 mg via SUBCUTANEOUS
  Filled 2021-10-31 (×3): qty 0.95

## 2021-10-31 MED ORDER — LORATADINE 10 MG PO TABS
10.0000 mg | ORAL_TABLET | Freq: Every day | ORAL | Status: DC
  Administered 2021-11-01 – 2021-11-06 (×6): 10 mg via ORAL
  Filled 2021-10-31 (×6): qty 1

## 2021-10-31 MED ORDER — FUROSEMIDE 10 MG/ML IJ SOLN
80.0000 mg | Freq: Once | INTRAMUSCULAR | Status: AC
  Administered 2021-10-31: 80 mg via INTRAVENOUS
  Filled 2021-10-31: qty 8

## 2021-10-31 MED ORDER — ENOXAPARIN SODIUM 40 MG/0.4ML IJ SOSY: 40.0000 mg | PREFILLED_SYRINGE | INTRAMUSCULAR | Status: DC

## 2021-10-31 MED ORDER — PREGABALIN 50 MG PO CAPS
50.0000 mg | ORAL_CAPSULE | Freq: Two times a day (BID) | ORAL | Status: DC
  Administered 2021-10-31 – 2021-11-06 (×12): 50 mg via ORAL
  Filled 2021-10-31 (×12): qty 1

## 2021-10-31 MED ORDER — HYDROXYZINE HCL 10 MG PO TABS
10.0000 mg | ORAL_TABLET | Freq: Three times a day (TID) | ORAL | Status: DC | PRN
  Filled 2021-10-31: qty 1

## 2021-10-31 NOTE — ED Notes (Signed)
Patient placed on purewick at this time. 

## 2021-10-31 NOTE — Assessment & Plan Note (Signed)
Most likely related to morbid obesity CPAP at bedtime 

## 2021-10-31 NOTE — H&P (Signed)
?History and Physical  ? ? ?Patient: [name] IWP:[id number] DOB: 12-24-1955 ?DOA: 10/31/2021 ?DOS: the patient was seen and examined on 10/31/2021 ?PCP: Ranae Plumber, [place]  ?Patient coming from: Home ? ?Chief Complaint:  ?Chief Complaint  ?Patient presents with  ? Shortness of Breath  ? ?HPI: Halaina Vanduzer is a 66 y.o. female with medical history significant for hypertension, asthma, obstructive sleep apnea on CPAP, chronic bilateral lymphedema, morbid obesity (BMI 72.94 kg/m2), dips tobacco, stage III A chronic kidney disease who presents to the emergency room from the wound care clinic for evaluation of worsening shortness of breath. ?Patient follows at the wound care clinic for her chronic bilateral lymphedema as well as a nonhealing ulcer involving her left foot.  She had gone for scheduled appointment today where she was noted to have significant exertional shortness of breath and so was advised to go to the ER for further evaluation.  She notes that she has worsening shortness of breath with exertion associated with typical orthopnea and worsening bilateral lower extremity swelling.  She also had some chest pressure overnight with radiation between her shoulder blades.  She admits to some dietary indiscretion and states that her diuretics were changed during her last hospitalization (she was switched to torsemide from furosemide).  According to the patient there has been a decrease in her urine output since the change in her diuretic therapy. ?She admits to significant weight gain over 20 pounds. ?She denies having any abdominal pain, no nausea, no vomiting, no fever, no chills, no cough, no dizziness, no lightheadedness, no blurred vision no focal deficit. ?Review of Systems: As mentioned in the history of present illness. All other systems reviewed and are negative. ?Past Medical History:  ?Diagnosis Date  ? Arthritis   ? Asthma   ? Enlarged heart   ? Hypertension   ? Lymphedema   ? Sleep  apnea   ? Spinal stenosis   ? ?Past Surgical History:  ?Procedure Laterality Date  ? COLONOSCOPY WITH PROPOFOL N/A 03/12/2018  ? Procedure: COLONOSCOPY WITH PROPOFOL;  Surgeon: Manya Silvas, MD;  Location[facility] ENDOSCOPY;  Service: Endoscopy;  Laterality: N/A;  ? NO PAST SURGERIES    ? ?Social History:  reports that she quit smoking about 31 years ago. She started smoking about 45 years ago. She smoked an average of .25 packs per day. Her smokeless tobacco use includes snuff. She reports that she does not drink alcohol and does not use drugs. ? ?Allergies  ?Allergen Reactions  ? Ace Inhibitors Hives and Swelling  ? Ibuprofen Hives and Swelling  ? Vancomycin Itching and Nausea And Vomiting  ? ? ?Family History  ?Problem Relation Age of Onset  ? Thyroid disease Other   ? Breast cancer Maternal Aunt   ? ? ?Prior to Admission medications   ?Medication Sig Start Date End Date Taking? Authorizing Provider  ?acetaminophen (TYLENOL) 500 MG tablet Take 500-1,000 mg by mouth every 6 (six) hours as needed for mild pain or fever.    [provider]  ?atenolol (TENORMIN) 50 MG tablet Take 50 mg by mouth daily. 02/29/20   [provider]  ?cetirizine (ZYRTEC) 10 MG tablet Take 1 tablet (10 mg total) by mouth daily. 12/25/20   Lavina Hamman, MD  ?hydrOXYzine (ATARAX) 10 MG tablet Take 1 tablet (10 mg total) by mouth 3 (three) times daily as needed for itching. 10/08/21   Loletha Grayer, MD  ?methocarbamol (ROBAXIN) 500 MG tablet Take 1 tablet (500 mg  total) by mouth every 8 (eight) hours as needed for muscle spasms. 01/21/21   Lavina Hamman, MD  ?pregabalin (LYRICA) 50 MG capsule Take 1 capsule (50 mg total) by mouth 2 (two) times daily. 10/08/21   Loletha Grayer, MD  ?torsemide (DEMADEX) 20 MG tablet Take 1 tablet (20 mg total) by mouth daily. 10/08/21   Loletha Grayer, MD  ? ? ?Physical Exam: ?Vitals:  ? 10/31/21 1226 10/31/21 1300 10/31/21 1328 10/31/21 1415  ?BP: (!) 177/86 135/69  (!) 141/72  ?Pulse: 70  66  61  ?Resp: '20 17  15  '$ ?Temp: 98 ?F (36.7 ?C)     ?TempSrc: Oral     ?SpO2: 99% 99%  100%  ?Weight: (!) 166.9 kg  (!) 189.7 kg   ?Height: 5' 3.5" (1.613 m)     ? ?Physical Exam ?Vitals and nursing note reviewed.  ?Constitutional:   ?   Appearance: She is obese.  ?HENT:  ?   Head: Normocephalic and atraumatic.  ?   Mouth/Throat:  ?   Mouth: Mucous membranes are moist.  ?Eyes:  ?   Pupils: Pupils are equal, round, and reactive to light.  ?Cardiovascular:  ?   Rate and Rhythm: Bradycardia present.  ?Pulmonary:  ?   Effort: Pulmonary effort is normal.  ?   Breath sounds: Examination of the right-lower field reveals rales. Examination of the left-lower field reveals rales. Rales present.  ?Abdominal:  ?   General: Bowel sounds are normal.  ?   Palpations: Abdomen is soft.  ?Musculoskeletal:  ?   Cervical back: Normal range of motion and neck supple.  ?   Right lower leg: Edema present.  ?   Left lower leg: Edema present.  ?   Comments: Chronic lymphedema  ?Skin: ?   General: Skin is warm and dry.  ?Neurological:  ?   General: No focal deficit present.  ?   Mental Status: She is alert and oriented to person, place, and time.  ?Psychiatric:     ?   Mood and Affect: Mood normal.     ?   Behavior: Behavior normal.  ? ? ?Data Reviewed: ?Relevant notes from primary care and specialist visits, past discharge summaries as available in EHR, including Care Everywhere. ?Prior diagnostic testing as pertinent to current admission diagnoses ?Updated medications and problem lists for reconciliation ?ED course, including vitals, labs, imaging, treatment and response to treatment ?Triage notes, nursing and pharmacy notes and ED provider's notes ?Notable results as noted in HPI ?Labs reviewed and essentially negative except for serum creatinine of 1.04, total protein 8.9, albumin 3.4, alkaline phosphatase 135, GFR 59, BNP 123, hemoglobin 11.6, hematocrit 37.4 ?Chest x-ray reviewed by me shows cardiomegaly and central venous  congestion ?Twelve-lead EKG reviewed by me shows normal sinus rhythm with no acute ST or T wave changes ?There are no new results to review at this time. ?Assessment and Plan: ?* Acute on chronic diastolic CHF (congestive heart failure) ([place]) ?Patient presents for evaluation of worsening shortness of breath with exertion associated with orthopnea and worsening bilateral lower extremity swelling ?Last 2D echocardiogram from 01/23 shows an LVEF of greater than 55% ?Place patient on Lasix 40 mg IV every 12 ?Continue atenolol ?Patient not on an ACE or ARB due to allergy ?We will request cardiology consult ?Continue low-sodium diet ? ? ?Essential hypertension ?Continue atenolol ? ?Chronic kidney disease, stage 3a ([place]) ?Most likely related to known history of hypertension ?Stable ? ?Lymphedema ?Chronic ?Follows up at the  wound clinic ?We will request wound care evaluation during this hospitalization ? ?Morbid obesity with BMI of 70 and over, adult [facility]) ?Complicates overall prognosis and care ?Lifestyle modification and exercise has been discussed with patient in detail ? ?OSA (obstructive sleep apnea) ?Most likely related to morbid obesity ?CPAP at bedtime ? ?Tobacco use ?Patient dips tobacco ?She has been advised to abstain from further use ? ? ? ? ? Advance Care Planning:   Code Status: Full Code  ? ?Consults: Cardiology ? ?Family Communication: Greater than 50% of time was spent discussing patient's condition and plan of care with her at the bedside.  All questions and concerns have been addressed.  She verbalizes understanding and agrees with the plan ? ? ?Severity of Illness: ?The appropriate patient status for this patient is INPATIENT. Inpatient status is judged to be reasonable and necessary in order to provide the required intensity of service to ensure the patient's safety. The patient's presenting symptoms, physical exam findings, and initial radiographic and laboratory data in the context of their chronic  comorbidities is felt to place them at high risk for further clinical deterioration. Furthermore, it is not anticipated that the patient will be medically stable for discharge from the hospital within 2 midnights of a

## 2021-10-31 NOTE — Assessment & Plan Note (Addendum)
--  cont atenolol at reduced 25 mg daily ?

## 2021-10-31 NOTE — Assessment & Plan Note (Signed)
Most likely related to known history of hypertension ?Stable ?

## 2021-10-31 NOTE — Assessment & Plan Note (Addendum)
Chronic.  Standing weight in clinic PTA was 391 lbs. ?--IV diuresis for fluid removal ?

## 2021-10-31 NOTE — Consult Note (Signed)
?[place] CARDIOLOGY CONSULT NOTE  ? ?    ?Patient ID: ?[name] ?MRN: [id number] ?DOB/AGE: 1955-12-11 66 y.o. ? ?Admit date: 10/31/2021 ?Referring Physician Dr. Francine Graven ?Primary Physician [facility] ?Primary Cardiologist Dr. Saralyn Pilar / Clabe Seal, PA-C ?Reason for Consultation acute on chronic HFpEF ? ?HPI: [name] is a 89yoF with a past medical history of chronic lymphedema, HFpEF (LVEF >55% mild MR 07/2021), hypertension, OSA on CPAP, morbid obesity, CKD 3 presented to [facility] ED from wound care clinic with worsening SOB. Cardiology is consulted for assistance with diuresis.  ? ?She was recently admitted to [facility] 3/28 - 4/2 for lower extremity cellulitis and treated with zosyn and discharged on augmentin and bactrim. Her home lasix was switch to torsemide '20mg'$  at discharge.  She noticed she did not make as much urine on the torsemide but continued to take it until 4/10 when she saw her PCP who switched her back to Lasix 20 mg with instructions to take an additional 20 mg if her swelling persisted or she had decreased urine output (which she has been doing almost every day).  She says since discharge 4/2 she has been drinking an extra cup or 2 of water on top of her regular 2L per day because she was instructed to while on antibiotics.  She admits to cooking her meat with salt, and occasionally eating meals that "taste salty" during the week.  She presents today with worsening lower extremity swelling to above her knees on top of her chronic lymphedema.  She got to the point where she was unable to wrap her legs herself.  She was seen by her PA at the wound care clinic today who sent her directly to the ED after she was very short of breath which she says also started worsening 3 days ago.  She has notably gained 48 pounds since discharge from the hospital 4/2.  Admits to abdominal swelling and yesterday afternoon had a headache with associated upper shoulder discomfort and a "weird feeling in  her chest" that lasted for about 15 minutes while sitting in a chair, and fell asleep without recurrence of the symptoms.  Admits to dry cough but denies palpitations, orthopnea. ? ?Labs are notable for a potassium of 3.9, BUN/creatinine 21/1.04, GFR 59.  BNP elevated to 123.9, high-sensitivity troponin -6-5.  H&H at baseline 11.6/37.4, platelets 188. ? ?Vitals are notable for blood pressure of 141/72, heart rate 61 during interview.  Chest x-ray with central venous congestion and cardiomegaly. ? ?Review of systems complete and found to be negative unless listed above  ? ? ?Past Medical History:  ?Diagnosis Date  ? Arthritis   ? Asthma   ? Enlarged heart   ? Hypertension   ? Lymphedema   ? Sleep apnea   ? Spinal stenosis   ?  ?Past Surgical History:  ?Procedure Laterality Date  ? COLONOSCOPY WITH PROPOFOL N/A 03/12/2018  ? Procedure: COLONOSCOPY WITH PROPOFOL;  Surgeon: Manya Silvas, MD;  Location: [facility] ENDOSCOPY;  Service: Endoscopy;  Laterality: N/A;  ? NO PAST SURGERIES    ?  ?(Not in a hospital admission) ? ?Social History  ? ?Socioeconomic History  ? Marital status: Single  ?  Spouse name: Not on file  ? Number of children: Not on file  ? Years of education: Not on file  ? Highest education level: Not on file  ?Occupational History  ? Not on file  ?Tobacco Use  ? Smoking status: Former  ?  Packs/day: 0.25  ?  Types: Cigarettes  ?  Start date: 07/09/1976  ?  Quit date: 07/09/1990  ?  Years since quitting: 31.3  ? Smokeless tobacco: Current  ?  Types: Snuff  ?Vaping Use  ? Vaping Use: Never used  ?Substance and Sexual Activity  ? Alcohol use: No  ? Drug use: No  ? Sexual activity: Not on file  ?Other Topics Concern  ? Not on file  ?Social History Narrative  ? Not on file  ? ?Social Determinants of Health  ? ?Financial Resource Strain: Not on file  ?Food Insecurity: Not on file  ?Transportation Needs: Not on file  ?Physical Activity: Not on file  ?Stress: Not on file  ?Social Connections: Not on file  ?Intimate  Partner Violence: Not on file  ?  ?Family History  ?Problem Relation Age of Onset  ? Thyroid disease Other   ? Breast cancer Maternal Aunt   ?  ? ? ?Review of systems complete and found to be negative unless listed above  ? ? ?PHYSICAL EXAM ?General: Pleasant black female, well nourished, in no acute distress.  Lying nearly flat in ED stretcher. ?HEENT:  Normocephalic and atraumatic. ?Neck:  No JVD.  ?Lungs: Normal respiratory effort on room air.  Decreased breath sounds without appreciable crackles or wheezes ?Heart: HRRR . Normal S1 and S2 without gallops or murmurs.  ?Abdomen: Obese appearing.  ?Msk: Normal strength and tone for age. ?Extremities: Warm and well perfused.  Extreme bilateral lower extremity lymphedema with chronic dimpling and associated erythema.  Lower legs wrapped with Ace bandage. ?Neuro: Alert and oriented X 3. ?Psych:  Answers questions appropriately.  ? ?Labs: ?  ?Lab Results  ?Component Value Date  ? WBC 7.4 10/31/2021  ? HGB 11.6 (L) 10/31/2021  ? HCT 37.4 10/31/2021  ? MCV 85.2 10/31/2021  ? PLT 188 10/31/2021  ?  ?Recent Labs  ?Lab 10/31/21 ?1248  ?NA 140  ?K 3.9  ?CL 104  ?CO2 25  ?BUN 21  ?CREATININE 1.04*  ?CALCIUM 9.1  ?PROT 8.9*  ?BILITOT 0.6  ?ALKPHOS 135*  ?ALT 18  ?AST 24  ?GLUCOSE 81  ? ?Lab Results  ?Component Value Date  ? TROPONINI <0.03 10/25/2018  ? No results found for: CHOL ?No results found for: HDL ?No results found for: [place] ?No results found for: TRIG ?No results found for: CHOLHDL ?No results found for: LDLDIRECT  ?  ?Radiology: US Venous Img Lower Bilateral (DVT) ? ?Result Date: 10/03/2021 ?CLINICAL DATA:  Bilateral lower extremity edema. EXAM: BILATERAL LOWER EXTREMITY VENOUS DOPPLER ULTRASOUND TECHNIQUE: Gray-scale sonography with graded compression, as well as color Doppler and duplex ultrasound were performed to evaluate the lower extremity deep venous systems from the level of the common femoral vein and including the common femoral, femoral, profunda  femoral, popliteal and calf veins including the posterior tibial, peroneal and gastrocnemius veins when visible. The superficial great saphenous vein was also interrogated. Spectral Doppler was utilized to evaluate flow at rest and with distal augmentation maneuvers in the common femoral, femoral and popliteal veins. COMPARISON:  None. FINDINGS: RIGHT LOWER EXTREMITY Common Femoral Vein: No evidence of thrombus. Normal compressibility, respiratory phasicity and response to augmentation. Saphenofemoral Junction: No evidence of thrombus. Normal compressibility and flow on color Doppler imaging. Profunda Femoral Vein: No evidence of thrombus. Normal compressibility and flow on color Doppler imaging. Femoral Vein: No evidence of thrombus. Normal compressibility, respiratory phasicity and response to augmentation. Popliteal Vein: Not visualized. Calf Veins: Not visualized due to marked edema. Superficial Great  Saphenous Vein: No evidence of thrombus. Normal compressibility. Venous Reflux:  None. Other Findings:  None. LEFT LOWER EXTREMITY Common Femoral Vein: No evidence of thrombus. Normal compressibility, respiratory phasicity and response to augmentation. Saphenofemoral Junction: No evidence of thrombus. Normal compressibility and flow on color Doppler imaging. Profunda Femoral Vein: No evidence of thrombus. Normal compressibility and flow on color Doppler imaging. Femoral Vein: No evidence of thrombus. Normal compressibility, respiratory phasicity and response to augmentation. Popliteal Vein: Not visualized. Calf Veins: Not visualized due to marked edema. Superficial Great Saphenous Vein: No evidence of thrombus. Normal compressibility. Venous Reflux:  None. Other Findings:  None. IMPRESSION: Limited examination due to body habitus and marked inflammation/edema involving bilateral lower extremities. The popliteal and calf veins could not be identified. No findings for deep venous thrombosis above this level.  Electronically Signed   By: Marijo Sanes M.D.   On: 10/03/2021 16:17  ? ?DG Chest Port 1 View ? ?Result Date: 10/31/2021 ?CLINICAL DATA:  Short of breath EXAM: PORTABLE CHEST 1 VIEW COMPARISON:  01/19/2021 FINDINGS: Edmund Hilda

## 2021-10-31 NOTE — ED Provider Notes (Signed)
? ?[facility] ?Provider Note ? ? ? Event Date/Time  ? First MD Initiated Contact with Patient 10/31/21 1229   ?  (approximate) ? ? ?History  ? ?Shortness of Breath ? ? ?HPI ? ?[name] is a 66 y.o. female who presents to the ED for evaluation of Shortness of Breath ?  ?I reviewed medical DC summary from 4/2.  Morbidly obese patient with BMI of 64.  She was admitted for lower extremity cellulitis at that time.  Treated with Zosyn to Augmentin/Bactrim. ?Otherwise history of diastolic dysfunction, chronic lymphedema, CKD. ? ?She reports weighing 368lbs at recent discharge. 391lbs at PCP visit on 4/10. 417lbs on the stretcher today.  ? ?Patient presents to the ED for the ration of increasing shortness of breath, orthopnea, weight gain and lower extremity edema.  She reports remarkable weight gain, as above, without intention to do so that she attributes to coinciding increased lower extremity swelling such that she cannot even wrap her legs anymore. ? ?Physical Exam  ? ?Triage Vital Signs: ?ED Triage Vitals [10/31/21 1226]  ?Enc Vitals Group  ?   BP (!) 177/86  ?   Pulse Rate 70  ?   Resp 20  ?   Temp 98 ?F (36.7 ?C)  ?   Temp Source Oral  ?   SpO2 99 %  ?   Weight (!) 367 lb 15.2 oz (166.9 kg)  ?   Height 5' 3.5" (1.613 m)  ?   Head Circumference   ?   Peak Flow   ?   Pain Score 10  ?   Pain Loc   ?   Pain Edu?   ?   Excl. in [place]?   ? ? ?Most recent vital signs: ?Vitals:  ? 10/31/21 1300 10/31/21 1415  ?BP: 135/69 (!) 141/72  ?Pulse: 66 61  ?Resp: 17 15  ?Temp:    ?SpO2: 99% 100%  ? ? ?General: Awake, no distress. Morbidly obese. Conversational in full sentences.  ?CV:  Good peripheral perfusion. RRR ?Resp:  Normal effort.  ?Abd:  No distention.  ?MSK:  Incredible lower extremity swelling, lymphedema, as below.  ?Neuro:  No focal deficits appreciated. ?Other:   ? ? ? ? ?ED Results / Procedures / Treatments  ? ?Labs ?(all labs ordered are listed, but only abnormal results are  displayed) ?Labs Reviewed  ?CBC - Abnormal; Notable for the following components:  ?    Result Value  ? Hemoglobin 11.6 (*)   ? All other components within normal limits  ?COMPREHENSIVE METABOLIC PANEL - Abnormal; Notable for the following components:  ? Creatinine, Ser 1.04 (*)   ? Total Protein 8.9 (*)   ? Albumin 3.4 (*)   ? Alkaline Phosphatase 135 (*)   ? GFR, Estimated 59 (*)   ? All other components within normal limits  ?BRAIN NATRIURETIC PEPTIDE - Abnormal; Notable for the following components:  ? B Natriuretic Peptide 123.9 (*)   ? All other components within normal limits  ?TROPONIN I (HIGH SENSITIVITY)  ?TROPONIN I (HIGH SENSITIVITY)  ? ? ?EKG ?Sinus rhythm, rate of 64 bpm.  Normal axis and intervals.  No evidence of acute ischemia. ? ?RADIOLOGY ?1 view CXR with increased pulmonary vascular congestion without pleural effusions, reviewed by me ? ?Official radiology report(s): ?DG Chest Port 1 View ? ?Result Date: 10/31/2021 ?CLINICAL DATA:  Short of breath EXAM: PORTABLE CHEST 1 VIEW COMPARISON:  01/19/2021 FINDINGS: Stable enlarged cardiac silhouette. There is central venous  congestion similar prior. No effusion, infiltrate pneumothorax. No acute osseous abnormality. IMPRESSION: Cardiomegaly and central venous congestion similar to comparison exam. Electronically Signed   By: Suzy Bouchard M.D.   On: 10/31/2021 13:19   ? ?PROCEDURES and INTERVENTIONS: ? ?.1-3 Lead EKG Interpretation ?Performed by: Vladimir Crofts, MD ?Authorized by: Vladimir Crofts, MD  ? ?  Interpretation: normal   ?  ECG rate:  62 ?  ECG rate assessment: normal   ?  Rhythm: sinus rhythm   ?  Ectopy: none   ?  Conduction: normal   ? ?Medications  ?furosemide (LASIX) injection 80 mg (80 mg Intravenous Given 10/31/21 1427)  ? ? ? ?IMPRESSION / MDM / ASSESSMENT AND PLAN / ED COURSE  ?I reviewed the triage vital signs and the nursing notes. ? ?66 year old female with chronic lymphedema presents to the ED with acute on chronic lower extremity  swelling, weight gain and orthopnea with evidence of diastolic CHF exacerbation.  She remains on room air without distress or indications for BiPAP.  She is grossly volume overload on examination without distress.  BNP is elevated.  Renal function around baseline and no significant derangements on CBC.  Negative troponin and less likely ACS.  CXR is chronically congested without pleural effusions.  Initiated diuresis in the ED and will consult medicine for admission. ? ?Clinical Course as of 10/31/21 1441  ?Tue Oct 31, 2021  ?1437 I consult with medicine, who agrees to admit [DS]  ?  ?Clinical Course User Index ?[DS] Vladimir Crofts, MD  ? ? ? ?FINAL CLINICAL IMPRESSION(S) / ED DIAGNOSES  ? ?Final diagnoses:  ?Acute on chronic diastolic congestive heart failure ([place])  ? ? ? ?Rx / DC Orders  ? ?ED Discharge Orders   ? ? None  ? ?  ? ? ? ?Note:  This document was prepared using Dragon voice recognition software and may include unintentional dictation errors. ?  ?Vladimir Crofts, MD ?10/31/21 1522 ? ?

## 2021-10-31 NOTE — ED Triage Notes (Signed)
Pt here from wound care today with SOB. Pt states she has been SOB since this weekend. Pt has CHF and CKD. Pt states she has been struggling to catch her breath and she has significant swelling to the area above her knees, pt has lymphedema.  ?

## 2021-10-31 NOTE — Assessment & Plan Note (Signed)
Complicates overall prognosis and care ?Lifestyle modification and exercise has been discussed with patient in detail ?

## 2021-10-31 NOTE — Assessment & Plan Note (Addendum)
Patient presents for evaluation of worsening shortness of breath with exertion associated with orthopnea and worsening bilateral lower extremity swelling ?Last 2D echocardiogram from 01/23 shows an LVEF of greater than 55% ?--cardio consulted ?Plan: ?--cont IV lasix 60 mg BID ?--Continue low-sodium diet with fluid restriction ? ?

## 2021-10-31 NOTE — Assessment & Plan Note (Signed)
Patient dips tobacco ?She has been advised to abstain from further use ?

## 2021-10-31 NOTE — Progress Notes (Addendum)
[name], [name] ([id number]) ?Visit Report for 10/31/2021 ?Chief Complaint Document Details ?Patient Name: [name], [name] ?Date of Service: 10/31/2021 10:30 AM ?Medical Record Number: [id number] ?Patient Account Number: [id number] ?Date of Birth/Sex: 10-Feb-1956 (66 y.o. F) ?Treating RN: [name] Dredge ?Primary Care Provider: Ranae Plumber Other Clinician: ?Referring Provider: Ranae Plumber ?Treating Provider/Extender: Jeri Cos ?Weeks in Treatment: 13 ?Information Obtained from: Patient ?Chief Complaint ?Bilateral LE lymphedema with Left LE Ulcers ?Electronic Signature(s) ?Signed: 10/31/2021 10:58:48 AM By: Worthy Keeler PA-C ?Entered By: Worthy Keeler on 10/31/2021 10:58:48 ?[name], [name] ([id number]) ?-------------------------------------------------------------------------------- ?HPI Details ?Patient Name: [name], [name] ?Date of Service: 10/31/2021 10:30 AM ?Medical Record Number: [id number] ?Patient Account Number: [id number] ?Date of Birth/Sex: [birth year] (66 y.o. F) ?Treating RN: [name] Dredge ?Primary Care Provider: Ranae Plumber Other Clinician: ?Referring Provider: Ranae Plumber ?Treating Provider/Extender: Jeri Cos ?Weeks in Treatment: 13 ?History of Present Illness ?HPI Description: The patient is a 66 year old female with history of hypertension and a long-standing history of bilateral lower extremity ?lymphedema (first presented on 4/2) . She has had open ulcers in the past which have always responded to compression therapy. She had briefly ?been to a lymphedema clinic in the past which helped her at the time. ?this time around she stopped treatment of her lymphedema pumps approximately 2 weeks ago because of some pain in the knees and then ?noticed the right leg getting worse. She was seen by her PCP who put her on clindamycin 4 times a day 2 days ago. ?The patient has seen [facility] and Dr. Delana Meyer had seen her last year where a vascular study including venous and arterial duplex  studies were within ?normal limits. ?he had recommended compression stockings and lymphedema pumps and the patient has been using this in about 2 weeks ago. ?She is known to be diabetic but in the past few time she's gone to her primary care doctor her hemoglobin A1c has been normal. ?02/11/2015 - after her last visit she took my advice and went to the ER regarding the progressive cellulitis of her right lower extremity and she ?was admitted between July 17 and 22nd. She received IV antibiotics and then was sent home on a course of steroid-induced and oral antibiotics. ?She has improved much since then. ?02/17/2015 -- she has been doing fine and the weeping of her legs has remarkably gone down. She has no fresh issues. ?READMISSION ?01/15/18 ?This patient was given this clinic before most recently in 2016 seen by Dr. Con Memos. She has massive bilateral lymphedema and over the last 2 ?months this had weeping edema out of the left leg. She has compression pumps but her compliance with these has been minimal. She has [facility] they've been using TCA/ABDs/kerlix under an Ace wrap.she has had recent problems with cellulitis. She was apparently seen ?in the ER and 12/23/17 and given clindamycin. She was then followed by her primary doctor and given doxycycline and Keflex. The pain seems to ?have settled down. ?In April 2018 the patient had arterial studies done at [facility]. This showed triphasic waveforms throughout the right leg and ?mostly triphasic waveforms on the left except for monophasic at the posterior tibial artery distally. She was not felt to have evidence of right lower ?extremity arterial stenosis or significant problems on the left side. 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 She also had ?a right lower extremity venous Doppler in January 2018 this was limited by the patient's body  habitus and lymphedema. Most of the proximal ?veins were not  visualized ?The patient presents with an area of denuded skin on the anterior medial part of the left calf. There is weeping edema fluid here. ?01/22/18; the patient has somewhat better edema control using her compression pumps twice a day and as a result she has much better ?epithelialization on the left anterior calf area. Only a small open area remains. ?01/29/18; the patient has been compliant with her compression pumps. Both the areas on her calf that healed. The remaining area on the left ?anterior leg is fully epithelialized ?Readmission: ?02/20/2019 upon evaluation today patient presents for reevaluation due to issues that she is having with the bilateral lower extremities. She ?actually has wounds open on both legs. On the right she has an area in the crease of her leg on the right around the knee region which is actually ?draining quite a bit and actually has some fungal type appearance to it. She has been on nystatin powder that seems to have helped to some ?degree. In regard to the left lower extremity this is actually in the lower portion of her leg closer to the ankle and again is continuing to drain as ?well unfortunately. There does not appear to be any signs of active infection at this time which is good news. No fevers, chills, nausea, vomiting, ?or diarrhea. She tells me that since she was seen last year she is actually been doing quite well for the most part with regard to her lower ?extremities. Unfortunately she now is experiencing a little bit more drainage at this time. She is concerned about getting this under control so that ?it does not get significantly worse. ?02/27/2019 on evaluation today patient appears to be doing somewhat better in regard to her bilateral lower extremity wounds. She has been ?tolerating the dressing changes without complication. Fortunately there is no signs of active infection at this point. No fevers, chills, nausea, ?vomiting, or diarrhea. She did get her dressing  supplies which is excellent news she was extremely excited to get these. She also got paperwork ?from [employer] for their financial assistance program where they may be able to help her out in the future if needed with supplies at discounted ?prices. ?03/06/2019 on evaluation today patient appears to be doing a little worse with regard to both areas of weeping on her bilateral lower extremities. ?This is around the right medial knee and just above the left ankle. With that being said she is unfortunately not doing as well as I would like to ?see. I feel like she may need to potentially go see someone at the lymphedema clinic as the wraps that she needs or even beyond what we can do ?here at the wound care center. She really does not have wounds she just has open areas of weeping that are causing some difficulty for her. ?Subsequently because of this and the moisture I am concerned about the potential for infection I am going to likely give her a prophylactic ?antibiotic today, Keflex, just to be on the safe side. Nonetheless again there is no obvious signs of active infection at this time. ?03/13/2019 on evaluation today patient appears to be doing well with regard to her bilateral lower extremities where she has been weeping ?compared to even last week's evaluation. I see some areas of new skin growth which is excellent and overall I am very pleased with how things ?seem to be progressing. No fevers, chills, nausea, vomiting, or diarrhea. ?Mcleary,  [name] ([id number]) ?03/20/2019 on evaluation today patient unfortunately is continuing to have issues with significant edema of the left lower extremity. Her right side ?seems to be doing much better. Unfortunately her left side is showing increased weeping of the lower portion of her leg. This is quite unfortunate ?obviously we were hoping to get her into the lymphedema clinic they really do not seem to when I see her how if she is draining. Despite the fact ?this is really  not wound related but more lymphedema weeping related. Nonetheless I do not know that this can be helpful for her to even go for ?that appointment since again I am not sure there is much that they would actually do at th

## 2021-10-31 NOTE — ED Notes (Signed)
Agbata, MD at bedside. ?

## 2021-11-01 ENCOUNTER — Inpatient Hospital Stay
Admit: 2021-11-01 | Discharge: 2021-11-01 | Disposition: A | Discharge status: Home / Self Care | Payer: Medicare HMO | Attending: Internal Medicine | Admitting: Internal Medicine

## 2021-11-01 DIAGNOSIS — I5033 Acute on chronic diastolic (congestive) heart failure: Secondary | ICD-10-CM | POA: Diagnosis not present

## 2021-11-01 DIAGNOSIS — I890 Lymphedema, not elsewhere classified: Secondary | ICD-10-CM

## 2021-11-01 LAB — CBC
HCT: 32.3 % — ABNORMAL LOW (ref 36.0–46.0)
Hemoglobin: 10 g/dL — ABNORMAL LOW (ref 12.0–15.0)
MCH: 26.3 pg (ref 26.0–34.0)
MCHC: 31 g/dL (ref 30.0–36.0)
MCV: 85 fL (ref 80.0–100.0)
Platelets: 151 10*3/uL (ref 150–400)
RBC: 3.8 MIL/uL — ABNORMAL LOW (ref 3.87–5.11)
RDW: 15.2 % (ref 11.5–15.5)
WBC: 5.4 10*3/uL (ref 4.0–10.5)
nRBC: 0 % (ref 0.0–0.2)

## 2021-11-01 LAB — BASIC METABOLIC PANEL
Anion gap: 3 — ABNORMAL LOW (ref 5–15)
BUN: 22 mg/dL (ref 8–23)
CO2: 29 mmol/L (ref 22–32)
Calcium: 8.4 mg/dL — ABNORMAL LOW (ref 8.9–10.3)
Chloride: 108 mmol/L (ref 98–111)
Creatinine, Ser: 0.98 mg/dL (ref 0.44–1.00)
GFR, Estimated: >60 mL/min (ref >60)
Glucose, Bld: 98 mg/dL (ref 70–99)
Potassium: 3.8 mmol/L (ref 3.5–5.1)
Sodium: 140 mmol/L (ref 135–145)

## 2021-11-01 LAB — ECHOCARDIOGRAM COMPLETE
AR max vel: 2.32 cm2
AV Area VTI: 2.51 cm2
AV Area mean vel: 2.53 cm2
AV Mean grad: 6 mmHg
AV Peak grad: 11.6 mmHg
Ao pk vel: 1.71 m/s
Area-P 1/2: 3.58 cm2
Height: 63.5 in
MV VTI: 1.77 cm2
S' Lateral: 3.1 cm
Weight: 6709.04 oz

## 2021-11-01 MED ORDER — FUROSEMIDE 10 MG/ML IJ SOLN
40.0000 mg | Freq: Two times a day (BID) | INTRAMUSCULAR | Status: DC
  Filled 2021-11-01: qty 4

## 2021-11-01 MED ORDER — FUROSEMIDE 10 MG/ML IJ SOLN
60.0000 mg | Freq: Two times a day (BID) | INTRAMUSCULAR | Status: DC
Start: 2021-11-01 — End: 2021-11-02
  Administered 2021-11-01 – 2021-11-02 (×3): 60 mg via INTRAVENOUS
  Filled 2021-11-01 (×3): qty 6

## 2021-11-01 MED ORDER — NYSTATIN 100000 UNIT/GM EX POWD
Freq: Three times a day (TID) | CUTANEOUS | Status: DC
  Administered 2021-11-01 – 2021-11-03 (×3): 1 via TOPICAL
  Filled 2021-11-01 (×2): qty 15

## 2021-11-01 MED ORDER — POTASSIUM CHLORIDE CRYS ER 20 MEQ PO TBCR
40.0000 meq | EXTENDED_RELEASE_TABLET | Freq: Two times a day (BID) | ORAL | Status: AC
  Administered 2021-11-01 – 2021-11-02 (×3): 40 meq via ORAL
  Filled 2021-11-01 (×3): qty 2

## 2021-11-01 NOTE — Plan of Care (Signed)
Nutrition Education Note ? ?RD consulted for nutrition education regarding CHF. ? ?Case discussed with Heart Failure RN.  ? ?Spoke with pt at bedside. Pt admits that she was adding salt to her foods and most recently eating a lot of processed, convenience foods due to not feeling well. Per pt, her neighbors also prepare meals per her (traditional [place] food) which she suspects also has salt in it. Pt has been thoughtful during this admission and reports that she has been considering ways to change her diet, including using her convection oven and making her own seasoning blends. Discussed ways to reduce sodium in her diet. Reinforced importance of self-management.  ? ?RD provided "Low Sodium Nutrition Therapy" handout from the Academy of Nutrition and Dietetics. Reviewed patient's dietary recall. Provided examples on ways to decrease sodium intake in diet. Discouraged intake of processed foods and use of salt shaker. Encouraged fresh fruits and vegetables as well as whole grain sources of carbohydrates to maximize fiber intake.  ? ?RD discussed why it is important for patient to adhere to diet recommendations, and emphasized the role of fluids, foods to avoid, and importance of weighing self daily. Teach back method used. ? ?Expect fair to good compliance. ? ?Body mass index is 73.11 kg/m?Marland Kitchen Pt meets criteria for extreme obesity, class III based on current BMI. Obesity is a complex, chronic medical condition that is optimally managed by a multidisciplinary care team. Weight loss is not an ideal goal for an acute inpatient hospitalization. However, if further work-up for obesity is warranted, consider outpatient referral to outpatient bariatric service and/or [facility].   ? ?Current diet order is 2 gram sodium, patient is consuming approximately 100% of meals at this time. Labs and medications reviewed. No further nutrition interventions warranted at this time. RD  contact information provided. If additional nutrition issues arise, please re-consult RD.  ? ?Loistine Chance, RD, LDN, CDCES ?Registered Dietitian II ?Certified Diabetes Care and Education Specialist ?Please refer to [facility] for RD and/or RD on-call/weekend/after hours pager   ?

## 2021-11-01 NOTE — Discharge Instructions (Signed)
Low Sodium Nutrition Therapy  ?Eating less sodium can help you if you have high blood pressure, heart failure, or kidney or liver disease.  ? ?Your body needs a little sodium, but too much sodium can cause your body to hold onto extra water. This extra water will raise your blood pressure and can cause damage to your heart, kidneys, or liver as they are forced to work harder.  ? ?Sometimes you can see how the extra fluid affects you because your hands, legs, or belly swell. You may also hold water around your heart and lungs, which makes it hard to breathe.  ? ?Even if you take medication for blood pressure or a water pill (diuretic) to remove fluid, it is still important to have less salt in your diet.  ? ?Check with your primary care provider before drinking alcohol since it may affect the amount of fluid in your body and how your heart, kidneys, or liver work. ?Sodium in Food ?A low-sodium meal plan limits the sodium that you get from food and beverages to 1,500-2,000 milligrams (mg) per day. Salt is the main source of sodium. Read the nutrition label on the package to find out how much sodium is in one serving of a food.  ?Select foods with 140 milligrams (mg) of sodium or less per serving.  ?You may be able to eat one or two servings of foods with a little more than 140 milligrams (mg) of sodium if you are closely watching how much sodium you eat in a day.  ?Check the serving size on the label. The amount of sodium listed on the label shows the amount in one serving of the food. So, if you eat more than one serving, you will get more sodium than the amount listed. ? ?Tips ?Cutting Back on Sodium ?Eat more fresh foods.  ?Fresh fruits and vegetables are low in sodium, as well as frozen vegetables and fruits that have no added juices or sauces.  ?Fresh meats are lower in sodium than processed meats, such as bacon, sausage, and hotdogs.  ?Not all processed foods are unhealthy, but some processed foods may have too  much sodium.  ?Eat less salt at the table and when cooking. One of the ingredients in salt is sodium.  ?One teaspoon of table salt has 2,300 milligrams of sodium.  ?Leave the salt out of recipes for pasta, casseroles, and soups. ?Be a Paramedic.  ?Food packages that say ?Salt-free?, sodium-free?, ?very low sodium,? and ?low sodium? have less than 140 milligrams of sodium per serving.  ?Beware of products identified as ?Unsalted,? ?No Salt Added,? ?Reduced Sodium,? or ?Lower Sodium.? These items may still be high in sodium. You should always check the nutrition label. ?Add flavors to your food without adding sodium.  ?Try lemon juice, lime juice, or vinegar.  ?Dry or fresh herbs add flavor.  ?Buy a sodium-free seasoning blend or make your own at home. ?You can purchase salt-free or sodium-free condiments like barbeque sauce in stores and online. Ask your registered dietitian nutritionist for recommendations and where to find them.  ? ?Eating in Restaurants ?Choose foods carefully when you eat outside your home. Restaurant foods can be very high in sodium. Many restaurants provide nutrition facts on their menus or their websites. If you cannot find that information, ask your server. Let your server know that you want your food to be cooked without salt and that you would like your salad dressing and sauces to be served on the  side.  ? ? ?Foods Recommended ?Food Group Foods Recommended  ?Grains Bread, bagels, rolls without salted tops ?Homemade bread made with reduced-sodium baking powder ?Cold cereals, especially shredded wheat and puffed rice ?Oats, grits, or cream of wheat ?Pastas, quinoa, and rice ?Popcorn, pretzels or crackers without salt ?Corn tortillas  ?Protein Foods Fresh meats and fish; [place] bacon (check the nutrition labels - make sure they are not packaged in a sodium solution) ?Canned or packed tuna (no more than 4 ounces at 1 serving) ?Beans and peas ?Soybeans) and tofu ?Eggs ?Nuts or nut butters  without salt  ?Dairy Milk or milk powder ?Plant milks, such as rice and soy ?Yogurt, including Greek yogurt ?Small amounts of natural cheese (blocks of cheese) or reduced-sodium cheese can be used in moderation. (Swiss, ricotta, and fresh mozzarella cheese are lower in sodium than the others) ?Cream Cheese ?Low sodium cottage cheese  ?Vegetables Fresh and frozen vegetables without added sauces or salt ?Homemade soups (without salt) ?Low-sodium, salt-free or sodium-free canned vegetables and soups  ?Fruit Fresh and canned fruits ?Dried fruits, such as raisins, cranberries, and prunes  ?Oils Tub or liquid margarine, regular or without salt ?Canola, corn, peanut, olive, safflower, or sunflower oils  ?Condiments Fresh or dried herbs such as basil, bay leaf, dill, mustard (dry), nutmeg, paprika, parsley, rosemary, sage, or thyme.  ?Low sodium ketchup ?Vinegar  ?Lemon or lime juice ?Pepper, red pepper flakes, and cayenne. ?Hot sauce contains sodium, but if you use just a drop or two, it will not add up to much.  ?Salt-free or sodium-free seasoning mixes and marinades ?Simple salad dressings: vinegar and oil  ? ?Foods Not Recommended ?Food Group Foods Not Recommended  ?Grains Breads or crackers topped with salt ?Cereals (hot/cold) with more than 300 mg sodium per serving ?Biscuits, cornbread, and other ?quick? breads prepared with baking soda ?Pre-packaged bread crumbs ?Seasoned and packaged rice and pasta mixes ?Self-rising flours  ?Protein Foods Cured meats: Bacon, ham, sausage, pepperoni and hot dogs ?Canned meats (chili, vienna sausage, or sardines) ?Smoked fish and meats ?Frozen meals that have more than 600 mg of sodium per serving ?Egg substitute (with added sodium)  ?Dairy Buttermilk ?Processed cheese spreads ?Cottage cheese (1 cup may have over 500 mg of sodium; look for low-sodium.) ?American or feta cheese ?Shredded Cheese has more sodium than blocks of cheese ?String cheese  ?Vegetables Canned vegetables  (unless they are salt-free, sodium-free or low sodium) ?Frozen vegetables with seasoning and sauces ?Sauerkraut and pickled vegetables ?Canned or dried soups (unless they are salt-free, sodium-free, or low sodium) ?[place] fries and onion rings  ?Fruit Dried fruits preserved with additives that have sodium  ?Oils Salted butter or margarine, all types of olives  ?Condiments Salt, sea salt, kosher salt, onion salt, and garlic salt ?Seasoning mixes with salt ?Bouillon cubes ?Ketchup ?Barbeque sauce and Worcestershire sauce unless low sodium ?Soy sauce ?Salsa, pickles, olives, relish ?Salad dressings: ranch, blue cheese, [place], and [place].  ? ?Low Sodium Sample 1-Day Menu  ?Breakfast 1 cup cooked oatmeal  ?1 slice whole wheat bread toast  ?1 tablespoon peanut butter without salt  ?1 banana  ?1 cup 1% milk  ?Lunch Tacos made with: 2 corn tortillas  ?? cup black beans, low sodium  ?? cup roasted or grilled chicken (without skin)  ?? avocado  ?Squeeze of lime juice  ?1 cup salad greens  ?1 tablespoon low-sodium salad dressing  ?? cup strawberries  ?1 orange  ?Afternoon Snack 1/3 cup grapes  ?6 ounces yogurt  ?  Evening Meal 3 ounces herb-baked fish  ?1 baked potato  ?2 teaspoons olive oil  ?? cup cooked carrots  ?2 thick slices tomatoes on:  ?2 lettuce leaves  ?1 teaspoon olive oil  ?1 teaspoon balsamic vinegar  ?1 cup 1% milk  ?Evening Snack 1 apple  ?? cup almonds without salt  ? ?Low-Sodium Vegetarian (Lacto-Ovo) Sample 1-Day Menu  ?Breakfast 1 cup cooked oatmeal  ?1 slice whole wheat toast  ?1 tablespoon peanut butter without salt  ?1 banana  ?1 cup 1% milk  ?Lunch Tacos made with: 2 corn tortillas  ?? cup black beans, low sodium  ?? cup roasted or grilled chicken (without skin)  ?? avocado  ?Squeeze of lime juice  ?1 cup salad greens  ?1 tablespoon low-sodium salad dressing  ?? cup strawberries  ?1 orange  ?Evening Meal Stir fry made with: ? cup tofu  ?1 cup brown rice  ?? cup broccoli  ?? cup green beans  ?? cup  peppers  ?? tablespoon peanut oil  ?1 orange  ?1 cup 1% milk  ?Evening Snack 4 strips celery  ?2 tablespoons hummus  ?1 hard-boiled egg  ? ?Low-Sodium Vegan Sample 1-Day Menu  ?Breakfast 1 cup cooked oatmeal  ?1

## 2021-11-01 NOTE — Progress Notes (Signed)
?[place] CARDIOLOG[facility] CONSULT NOTE  ? ?    ?Patient ID: ?[name] ?MRN: [id number] ?DOB/AGE: 66-Jul-1957 66 y.o. ? ?Admit date: 10/31/2021 ?Referring Physician Dr. Francine Graven ?Primary Physician [facility] ?Primary Cardiologist Dr. Saralyn Pilar / Clabe Seal, PA-C ?Reason for Consultation acute on chronic HFpEF ? ?HPI: [name] is a 27yoF with a past medical history of chronic lymphedema, HFpEF (LVEF >55% mild MR 07/2021), hypertension, OSA on CPAP, morbid obesity, CKD 3 presented to [facility] ED from wound care clinic with worsening SOB. Cardiology is consulted for assistance with diuresis.  ? ?Interval History: ?-feels like her breathing is a little better, legs less tight compared to yesterday ?-brisk diuresis with IV lasix overnight (net neg 5L), but still significantly volume up.  ? ?Review of systems complete and found to be negative unless listed above  ? ? ?Past Medical History:  ?Diagnosis Date  ? Arthritis   ? Asthma   ? Enlarged heart   ? Hypertension   ? Lymphedema   ? Sleep apnea   ? Spinal stenosis   ?  ?Past Surgical History:  ?Procedure Laterality Date  ? COLONOSCOPY WITH PROPOFOL N/A 03/12/2018  ? Procedure: COLONOSCOPY WITH PROPOFOL;  Surgeon: Manya Silvas, MD;  Location: [facility] ENDOSCOPY;  Service: Endoscopy;  Laterality: N/A;  ? NO PAST SURGERIES    ?  ?Medications Prior to Admission  ?Medication Sig Dispense Refill Last Dose  ? acetaminophen (TYLENOL) 500 MG tablet Take 500-1,000 mg by mouth every 6 (six) hours as needed for mild pain or fever.   10/30/2021  ? atenolol (TENORMIN) 50 MG tablet Take 50 mg by mouth daily.   10/30/2021  ? cetirizine (ZYRTEC) 10 MG tablet Take 1 tablet (10 mg total) by mouth daily. 60 tablet 0 10/30/2021  ? furosemide (LASIX) 20 MG tablet Take 40 mg by mouth daily.   10/30/2021  ? pregabalin (LYRICA) 50 MG capsule Take 1 capsule (50 mg total) by mouth 2 (two) times daily. 60 capsule 0 Past Month  ? hydrOXYzine (ATARAX) 10 MG tablet Take 1 tablet (10 mg total) by  mouth 3 (three) times daily as needed for itching. (Patient not taking: Reported on 10/31/2021) 30 tablet 0 Not Taking  ? methocarbamol (ROBAXIN) 500 MG tablet Take 1 tablet (500 mg total) by mouth every 8 (eight) hours as needed for muscle spasms. (Patient not taking: Reported on 10/31/2021) 15 tablet 0 Not Taking  ? torsemide (DEMADEX) 20 MG tablet Take 1 tablet (20 mg total) by mouth daily. (Patient not taking: Reported on 10/31/2021) 30 tablet 0 Not Taking  ? ? ?Social History  ? ?Socioeconomic History  ? Marital status: Single  ?  Spouse name: Not on file  ? Number of children: Not on file  ? Years of education: Not on file  ? Highest education level: Not on file  ?Occupational History  ? Not on file  ?Tobacco Use  ? Smoking status: Former  ?  Packs/day: 0.25  ?  Types: Cigarettes  ?  Start date: 07/09/1976  ?  Quit date: 07/09/1990  ?  Years since quitting: 31.3  ? Smokeless tobacco: Current  ?  Types: Snuff  ?Vaping Use  ? Vaping Use: Never used  ?Substance and Sexual Activity  ? Alcohol use: No  ? Drug use: No  ? Sexual activity: Not on file  ?Other Topics Concern  ? Not on file  ?Social History Narrative  ? Not on file  ? ?Social Determinants of Health  ? ?Financial  Resource Strain: Not on file  ?Food Insecurity: Not on file  ?Transportation Needs: Not on file  ?Physical Activity: Not on file  ?Stress: Not on file  ?Social Connections: Not on file  ?Intimate Partner Violence: Not on file  ?  ?Family History  ?Problem Relation Age of Onset  ? Thyroid disease Other   ? Breast cancer Maternal Aunt   ?  ? ?PHYSICAL EXAM ?General: Pleasant black female, well nourished, in no acute distress.  Lying at incline in PCU bed. Wound nurse at bedside.  ?HEENT:  Normocephalic and atraumatic. ?Neck:  No JVD.  ?Lungs: Normal respiratory effort on room air.  Decreased breath sounds without appreciable crackles or wheezes ?Heart: HRRR . Normal S1 and S2 without gallops or murmurs.  ?Abdomen: Obese appearing.  ?Msk: Normal strength  and tone for age. ?Extremities: Warm and well perfused.  Extreme bilateral lower extremity lymphedema with chronic dimpling and associated erythema. ?Neuro: Alert and oriented X 3. ?Psych:  Answers questions appropriately.  ? ?Labs: ?  ?Lab Results  ?Component Value Date  ? WBC 5.4 11/01/2021  ? HGB 10.0 (L) 11/01/2021  ? HCT 32.3 (L) 11/01/2021  ? MCV 85.0 11/01/2021  ? PLT 151 11/01/2021  ?  ?Recent Labs  ?Lab 10/31/21 ?1248 11/01/21 ?6269  ?NA 140 140  ?K 3.9 3.8  ?CL 104 108  ?CO2 25 29  ?BUN 21 22  ?CREATININE 1.04* 0.98  ?CALCIUM 9.1 8.4*  ?PROT 8.9*  --   ?BILITOT 0.6  --   ?ALKPHOS 135*  --   ?ALT 18  --   ?AST 24  --   ?GLUCOSE 81 98  ? ? ?Lab Results  ?Component Value Date  ? TROPONINI <0.03 10/25/2018  ? ? No results found for: CHOL ?No results found for: HDL ?No results found for: [place] ?No results found for: TRIG ?No results found for: CHOLHDL ?No results found for: LDLDIRECT  ?  ?Radiology: US Venous Img Lower Bilateral (DVT) ? ?Result Date: 10/03/2021 ?CLINICAL DATA:  Bilateral lower extremity edema. EXAM: BILATERAL LOWER EXTREMITY VENOUS DOPPLER ULTRASOUND TECHNIQUE: Gray-scale sonography with graded compression, as well as color Doppler and duplex ultrasound were performed to evaluate the lower extremity deep venous systems from the level of the common femoral vein and including the common femoral, femoral, profunda femoral, popliteal and calf veins including the posterior tibial, peroneal and gastrocnemius veins when visible. The superficial great saphenous vein was also interrogated. Spectral Doppler was utilized to evaluate flow at rest and with distal augmentation maneuvers in the common femoral, femoral and popliteal veins. COMPARISON:  None. FINDINGS: RIGHT LOWER EXTREMITY Common Femoral Vein: No evidence of thrombus. Normal compressibility, respiratory phasicity and response to augmentation. Saphenofemoral Junction: No evidence of thrombus. Normal compressibility and flow on color Doppler  imaging. Profunda Femoral Vein: No evidence of thrombus. Normal compressibility and flow on color Doppler imaging. Femoral Vein: No evidence of thrombus. Normal compressibility, respiratory phasicity and response to augmentation. Popliteal Vein: Not visualized. Calf Veins: Not visualized due to marked edema. Superficial Great Saphenous Vein: No evidence of thrombus. Normal compressibility. Venous Reflux:  None. Other Findings:  None. LEFT LOWER EXTREMITY Common Femoral Vein: No evidence of thrombus. Normal compressibility, respiratory phasicity and response to augmentation. Saphenofemoral Junction: No evidence of thrombus. Normal compressibility and flow on color Doppler imaging. Profunda Femoral Vein: No evidence of thrombus. Normal compressibility and flow on color Doppler imaging. Femoral Vein: No evidence of thrombus. Normal compressibility, respiratory phasicity and response to augmentation. Popliteal Vein:  Not visualized. Calf Veins: Not visualized due to marked edema. Superficial Great Saphenous Vein: No evidence of thrombus. Normal compressibility. Venous Reflux:  None. Other Findings:  None. IMPRESSION: Limited examination due to body habitus and marked inflammation/edema involving bilateral lower extremities. The popliteal and calf veins could not be identified. No findings for deep venous thrombosis above this level. Electronically Signed   By: Marijo Sanes M.D.   On: 10/03/2021 16:17  ? ?DG Chest Port 1 View ? ?Result Date: 10/31/2021 ?CLINICAL DATA:  Short of breath EXAM: PORTABLE CHEST 1 VIEW COMPARISON:  01/19/2021 FINDINGS: Stable enlarged cardiac silhouette. There is central venous congestion similar prior. No effusion, infiltrate pneumothorax. No acute osseous abnormality. IMPRESSION: Cardiomegaly and central venous congestion similar to comparison exam. Electronically Signed   By: Suzy Bouchard M.D.   On: 10/31/2021 13:19   ? ?ECHO 08/02/2021 ?NORMAL LEFT VENTRICULAR SYSTOLIC FUNCTION  ?RIGHT  VENTRICLE INADEQUATELY SEEN  ?MILD VALVULAR REGURGITATION (See above)  ?NO VALVULAR STENOSIS  ?TECHNICALLY DIFFICULT STUDY  ?ESTIMATED LVEF >55%  ?Aortic: NORMAL GRADIENTS  ?Mitral: MILD MR  ?Tricuspid: M

## 2021-11-01 NOTE — Consult Note (Addendum)
? ?  Heart Failure Nurse Navigator Note ? ?HFpEF 55 to 60%.  Mild biatrial enlargement by echocardiogram performed in October 2020.  Echocardiogram results are pending on this admission. ? ?She presented to the emergency room with complaints of worsening shortness of breath, worsening lower extremity edema, abdominal distention, early satiety and decreased urine output.  She had noted a 23 pound weight gain from when she was discharged from the hospital. ? ?Comorbidities: ? ?Hypertension ?Asthma ?Obstructive sleep apnea on CPAP ?Chronic lymphedema ?Morbid obesity ?Chronic kidney disease stage III ? ?Medications: ? ?Furosemide 40 mg IV 2 times a day ? ? ?Labs: ? ?Sodium 140, potassium 3.8, chloride 108, CO2 29, BUN 22, creatinine 0.89, GFR greater than 60 ?Weight is 190.2 kg, BMI 73.11 ?Blood pressure 106/51 ?Intake 240 mL ?Output 3700 mL ? ?She states that she lives at home with her son. ? ?She has a 3% no-show ratio which is 3 out of 90 appointments. ? ?She is compliant with her medications and gets them from the [place] clinic  and they are delivered to her home. ? ?Speaks English and has an associates  degree in business from a [employer] college.  Does not work, is on disability due to her lymphedema. ? ?Discussed heart failure, she feels its the pump's inability to meet the demands of the body. ? ?Discussed low-sodium diet and fluid restriction.  She notes at times that she goes over fluid restriction.  Discussed ways to combat thirst without taking an extra liquids.  She does admit to using a little bit of salt.  Discussed ways to seasoning food without using salt. ? ?Due to her size she is unable to weigh herself, Dr. Leslye Peer has suggested that she stand on 2 scales.  I told her I would look into that and get back to her. ? ?Discussed signs and symptoms and changes in weight to report. ? ?She has an appointment in the heart failure clinic on May 9 at 4 PM. ? ?She was given the living with heart failure  teaching booklet, zone magnet, information on low-sodium, weight chart ? ?Pricilla Riffle RN [facility] ?

## 2021-11-01 NOTE — TOC Initial Note (Signed)
Transition of Care (TOC) - Initial/Assessment Note  ? ? ?Patient Details  ?Name: [name] ?MRN: [id number] ?Date of Birth: [birth year] ? ?Transition of Care (TOC) CM/SW Contact:    ?Laurena Slimmer, RN ?Phone Number: ?11/01/2021, 2:01 PM ? ?Clinical Narrative:                 ?TOC consulted for heart failure screen. Heart failure RN Delmar Landau has been informed.   ? ?  ?  ? ? ?Patient Goals and CMS Choice ?  ?  ?  ? ?Expected Discharge Plan and Services ?  ?  ?  ?  ?  ?                ?  ?  ?  ?  ?  ?  ?  ?  ?  ?  ? ?Prior Living Arrangements/Services ?  ?  ?  ?       ?  ?  ?  ?  ? ?Activities of Daily Living ?Home Assistive Devices/Equipment: Gilford Rile (specify type) ?ADL Screening (condition at time of admission) ?Patient's cognitive ability adequate to safely complete daily activities?: Yes ?Is the patient deaf or have difficulty hearing?: No ?Does the patient have difficulty seeing, even when wearing glasses/contacts?: No ?Does the patient have difficulty concentrating, remembering, or making decisions?: No ?Patient able to express need for assistance with ADLs?: Yes ?Does the patient have difficulty dressing or bathing?: No ?Independently performs ADLs?: Yes (appropriate for developmental age) ?Does the patient have difficulty walking or climbing stairs?: Yes ?Weakness of Legs: Both ?Weakness of Arms/Hands: None ? ?Permission Sought/Granted ?  ?  ?   ?   ?   ?   ? ?Emotional Assessment ?  ?  ?  ?  ?  ?  ? ?Admission diagnosis:  Acute on chronic diastolic congestive heart failure ([place]) [I50.33] ?Acute on chronic diastolic CHF (congestive heart failure) ([place]) [I50.33] ?Patient Active Problem List  ? Diagnosis Date Noted  ? Acute on chronic diastolic CHF (congestive heart failure) ([place]) 10/31/2021  ? Thrombocytopenia ([place]) 10/06/2021  ? Neuropathy 10/05/2021  ? Chronic kidney disease, stage 3a ([place]) 10/03/2021  ? Chronic diastolic CHF (congestive heart failure) ([place]) 10/03/2021  ? OSA on CPAP 04/17/2021  ? CPAP use  counseling 04/17/2021  ? Sepsis due to cellulitis ([place]) 01/20/2021  ? DNR (do not resuscitate)   ? Lactic acidosis   ? AKI (acute kidney injury) ([place])   ? Bilateral lower leg cellulitis 04/15/2020  ? Anemia of chronic disease   ? Cellulitis of left lower extremity 11/03/2018  ? Sepsis ([place]) 04/17/2018  ? Chest pain with high risk for cardiac etiology 02/04/2017  ? Heart palpitations 02/04/2017  ? SOB (shortness of breath) on exertion 02/04/2017  ? OSA (obstructive sleep apnea) 08/04/2016  ? HTN (hypertension) 08/04/2016  ? Asthma 08/04/2016  ? Screening for colon cancer 05/17/2015  ? Cellulitis 01/24/2015  ? Cellulitis of right leg 01/23/2015  ? Skin rash 12/28/2013  ? Tobacco use 12/28/2013  ? Spinal stenosis 07/11/2011  ? Cataracts, bilateral 05/30/2010  ? History of colonic polyps 12/28/2009  ? Lymphedema 02/10/2009  ? Idiopathic urticaria 02/10/2009  ? Morbid obesity with BMI of 70 and over, adult ([place]) 02/10/2009  ? Other abnormal glucose 02/10/2009  ? Essential hypertension 02/10/2009  ? ?PCP:  Ranae Plumber, [place] ?Pharmacy:   ?[facility] [place] ?[place] [address] ?Phone: [phone] Fax: [phone] ? ? ? ? ?  Social Determinants of Health (SDOH) Interventions ?  ? ?Readmission Risk Interventions ? ?  01/20/2021  ?  9:48 AM  ?Readmission Risk Prevention Plan  ?Transportation Screening Complete  ?PCP or Specialist Appt within 5-7 Days Complete  ?Home Care Screening Complete  ?Medication Review (RN CM) Referral to Pharmacy  ? ? ? ?

## 2021-11-01 NOTE — Progress Notes (Signed)
WINDA, SUMMERALL ([id number]) ?Visit Report for 10/31/2021 ?Arrival Information Details ?Patient Name: [name], [name] ?Date of Service: 10/31/2021 10:30 AM ?Medical Record Number: [id number] ?Patient Account Number: [id number] ?Date of Birth/Sex: [birth year] (66 y.o. F) ?Treating RN: [name] Dredge ?Primary Care Franci Oshana: Ranae Plumber Other Clinician: ?Referring Hailey Stormer: Ranae Plumber ?Treating Alassane Kalafut/Extender: Jeri Cos ?Weeks in Treatment: 13 ?Visit Information History Since Last Visit ?Added or deleted any medications: No ?Patient Arrived: Gilford Rile ?Any new allergies or adverse reactions: No ?Arrival Time: 10:56 ?Had a fall or experienced change in No ?Accompanied By: self ?activities of daily living that may affect ?Transfer Assistance: None ?risk of falls: ?Patient Identification Verified: Yes ?Hospitalized since last visit: No ?Secondary Verification Process Completed: Yes ?Has Dressing in Place as Prescribed: Yes ?Pain Present Now: Yes ?Electronic Signature(s) ?Signed: 10/31/2021 4:54:44 PM By: [name] Dredge ?Entered By: [name] Dredge on 10/31/2021 10:57:17 ?BRYTNEY, SOMES ([id number]) ?-------------------------------------------------------------------------------- ?Clinic Level of Care Assessment Details ?Patient Name: [name] ?Date of Service: 10/31/2021 10:30 AM ?Medical Record Number: [id number] ?Patient Account Number: [id number] ?Date of Birth/Sex: 1955-12-09 (66 y.o. F) ?Treating RN: [name] Dredge ?Primary Care Valera Vallas: Ranae Plumber Other Clinician: ?Referring Karmen Altamirano: Ranae Plumber ?Treating Fern Canova/Extender: Jeri Cos ?Weeks in Treatment: 13 ?Clinic Level of Care Assessment Items ?TOOL 4 Quantity Score ?'[]'$  - Use when only an EandM is performed on FOLLOW-UP visit 0 ?ASSESSMENTS - Nursing Assessment / Reassessment ?X - Reassessment of Co-morbidities (includes updates in patient status) 1 10 ?X- 1 5 ?Reassessment of Adherence to Treatment Plan ?ASSESSMENTS - Wound and Skin  Assessment / Reassessment ?X - Simple Wound Assessment / Reassessment - one wound 1 5 ?'[]'$  - 0 ?Complex Wound Assessment / Reassessment - multiple wounds ?'[]'$  - 0 ?Dermatologic / Skin Assessment (not related to wound area) ?ASSESSMENTS - Focused Assessment ?X - Circumferential Edema Measurements - multi extremities 1 5 ?'[]'$  - 0 ?Nutritional Assessment / Counseling / Intervention ?'[]'$  - 0 ?Lower Extremity Assessment (monofilament, tuning fork, pulses) ?'[]'$  - 0 ?Peripheral Arterial Disease Assessment (using hand held doppler) ?ASSESSMENTS - Ostomy and/or Continence Assessment and Care ?'[]'$  - Incontinence Assessment and Management 0 ?'[]'$  - 0 ?Ostomy Care Assessment and Management (repouching, etc.) ?PROCESS - Coordination of Care ?X - Simple Patient / Family Education for ongoing care 1 15 ?'[]'$  - 0 ?Complex (extensive) Patient / Family Education for ongoing care ?'[]'$  - 0 ?Staff obtains Consents, Records, Test Results / Process Orders ?'[]'$  - 0 ?Staff telephones HHA, Nursing Homes / Clarify orders / etc ?'[]'$  - 0 ?Routine Transfer to another Facility (non-emergent condition) ?'[]'$  - 0 ?Routine Hospital Admission (non-emergent condition) ?'[]'$  - 0 ?New Admissions / Biomedical engineer / Ordering NPWT, Apligraf, etc. ?'[]'$  - 0 ?Emergency Hospital Admission (emergent condition) ?'[]'$  - 0 ?Simple Discharge Coordination ?X- 1 15 ?Complex (extensive) Discharge Coordination ?PROCESS - Special Needs ?'[]'$  - Pediatric / Minor Patient Management 0 ?'[]'$  - 0 ?Isolation Patient Management ?'[]'$  - 0 ?Hearing / Language / Visual special needs ?'[]'$  - 0 ?Assessment of Community assistance (transportation, D/C planning, etc.) ?'[]'$  - 0 ?Additional assistance / Altered mentation ?'[]'$  - 0 ?Support Surface(s) Assessment (bed, cushion, seat, etc.) ?INTERVENTIONS - Wound Cleansing / Measurement ?JACARI, KIRSTEN ([id number]) ?X- 1 5 ?Simple Wound Cleansing - one wound ?'[]'$  - 0 ?Complex Wound Cleansing - multiple wounds ?X- 1 5 ?Wound Imaging (photographs - any number  of wounds) ?'[]'$  - 0 ?Wound Tracing (instead of photographs) ?X- 1 5 ?Simple Wound Measurement - one wound ?'[]'$  - 0 ?  Complex Wound Measurement - multiple wounds ?INTERVENTIONS - Wound Dressings ?X - Small Wound Dressing one or multiple wounds 1 10 ?'[]'$  - 0 ?Medium Wound Dressing one or multiple wounds ?'[]'$  - 0 ?Large Wound Dressing one or multiple wounds ?'[]'$  - 0 ?Application of Medications - topical ?'[]'$  - 0 ?Application of Medications - injection ?INTERVENTIONS - Miscellaneous ?'[]'$  - External ear exam 0 ?'[]'$  - 0 ?Specimen Collection (cultures, biopsies, blood, body fluids, etc.) ?'[]'$  - 0 ?Specimen(s) / Culture(s) sent or taken to Lab for analysis ?'[]'$  - 0 ?Patient Transfer (multiple staff / Civil Service fast streamer / Similar devices) ?'[]'$  - 0 ?Simple Staple / Suture removal (25 or less) ?'[]'$  - 0 ?Complex Staple / Suture removal (26 or more) ?'[]'$  - 0 ?Hypo / Hyperglycemic Management (close monitor of Blood Glucose) ?'[]'$  - 0 ?Ankle / Brachial Index (ABI) - do not check if billed separately ?X- 1 5 ?Vital Signs ?Has the patient been seen at the hospital within the last three years: Yes ?Total Score: 85 ?Level Of Care: New/Established - Level ?3 ?Electronic Signature(s) ?Signed: 10/31/2021 4:54:44 PM By: [name] Dredge ?Entered By: [name] Dredge on 10/31/2021 11:38:07 ?RAMONA, RUARK ([id number]) ?-------------------------------------------------------------------------------- ?Encounter Discharge Information Details ?Patient Name: [name] ?Date of Service: 10/31/2021 10:30 AM ?Medical Record Number: [id number] ?Patient Account Number: [id number] ?Date of Birth/Sex: Mar 23, 1956 (66 y.o. F) ?Treating RN: [name] Dredge ?Primary Care Anzleigh Slaven: Ranae Plumber Other Clinician: ?Referring Kenae Lindquist: Ranae Plumber ?Treating Kolbey Teichert/Extender: Jeri Cos ?Weeks in Treatment: 13 ?Encounter Discharge Information Items ?Discharge Condition: Stable ?Ambulatory Status: Gilford Rile ?Discharge Destination: Emergency Room ?Telephoned: Yes ?Orders Sent:  No ?Transportation: Other ?Accompanied By: self Ruta Hinds ?Schedule Follow-up Appointment: Yes ?Clinical Summary of Care: Patient Declined ?Electronic Signature(s) ?Signed: 10/31/2021 2:36:06 PM By: [name] Dredge ?Entered By: [name] Dredge on 10/31/2021 14:36:05 ?ROGENA, DEUPREE ([id number]) ?-------------------------------------------------------------------------------- ?Lower Extremity Assessment Details ?Patient Name: JAHNIA, HEWES ?Date of Service: 10/31/2021 10:30 AM ?Medical Record Number: [id number] ?Patient Account Number: [id number] ?Date of Birth/Sex: [birth year] (66 y.o. F) ?Treating RN: [name] Dredge ?Primary Care Rudi Knippenberg: Ranae Plumber Other Clinician: ?Referring Vander Kueker: Ranae Plumber ?Treating Abigael Mogle/Extender: Jeri Cos ?Weeks in Treatment: 13 ?Edema Assessment ?Assessed: [Left: No] [Right: No] ?Edema: [Left: Yes] [Right: Yes] ?Calf ?Left: Right: ?Point of Measurement: 39 cm From Medial Instep 89 cm 85 cm ?Ankle ?Left: Right: ?Point of Measurement: 9 cm From Medial Instep 58.5 cm 57 cm ?Vascular Assessment ?Pulses: ?Dorsalis Pedis ?Palpable: [Left:Yes] [Right:Yes] ?Notes ?difficult to measure calf on bilat legs due to size of legs ?Electronic Signature(s) ?Signed: 10/31/2021 4:54:44 PM By: [name] Dredge ?Entered By: [name] Dredge on 10/31/2021 11:12:46 ?BIANCO, CANGE ([id number]) ?-------------------------------------------------------------------------------- ?Multi Wound Chart Details ?Patient Name: PATTYE, MEDA ?Date of Service: 10/31/2021 10:30 AM ?Medical Record Number: [id number] ?Patient Account Number: [id number] ?Date of Birth/Sex: 01-01-1956 (65 y.o. F) ?Treating RN: [name] Dredge ?Primary Care Adaeze Better: Ranae Plumber Other Clinician: ?Referring Juda Toepfer: Ranae Plumber ?Treating Latron Ribas/Extender: Jeri Cos ?Weeks in Treatment: 13 ?Vital Signs ?Height(in): 63 ?Pulse(bpm): 74 ?Weight(lbs): 372 ?Blood Pressure(mmHg): 158/88 ?Body Mass Index(BMI):  65.9 ?Temperature(??F): 97.7 ?Respiratory Rate(breaths/min): 18 ?Photos: [N/A:N/A] ?Wound Location: Left, Medial, Anterior Lower Leg N/A N/A ?Wounding Event: Gradually Appeared N/A N/A ?Primary Etiology: Lymphedema

## 2021-11-01 NOTE — Consult Note (Signed)
WOC Nurse Consult Note: ?Reason for Consult: Consult requested for bilat legs.  Pt is familiar to [facility] team from previous admissions. She is followed by the outpatient wound center prior to admission and states wounds have greatly improved. She was seen at that location yesterday. ?Bilat legs with extensive lymphedema from thighs to ankles.  Right leg without open wounds or drainage.  Significant skin folds. ?Left anterior calf with chronic full thickness wound; 3X3X.2cm, yellow and moist, mod amt tan drainage. Significant skin folds. ?Dressing procedure/placement/frequency: Continue present plan of care as ordered by the outpatient wound care center. Topical treatment orders provided for bedside nurses to perform as follows:  ?1. Change dressing to left leg Q day as follows:  Leave Mepitel in place over wound, change Q Wed.  Apply piece of Aquacel over Mepitel, then cover wound and skin folds with abd pads.  Cover with kerlex and ace wrap.  Moisten Aquacel with NS to assist with removal each time. ?2. Change dressing to right leg Q M/W/F as follows: Cover skin folds with abd pads.  Cover with kerlex and ace wrap. ?Pt plans to follow-up with the outpatient wound care center after discharge. ?Please re-consult if further assistance is needed.  Thank-you,  ?Julien Girt MSN, RN, [place], [place], CNS ?[phone]  ? ?  ?

## 2021-11-01 NOTE — Progress Notes (Signed)
?  Progress Note ? ? ?Patient: [name] HLK:[id number] DOB: 01/24/1956 DOA: 10/31/2021     1 ?DOS: the patient was seen and examined on 11/02/2021 ?  ?Brief hospital course: ?No notes on file ? ?Assessment and Plan: ?* Acute on chronic diastolic CHF (congestive heart failure) ([place]) ?Patient presents for evaluation of worsening shortness of breath with exertion associated with orthopnea and worsening bilateral lower extremity swelling ?Last 2D echocardiogram from 01/23 shows an LVEF of greater than 55% ?--cardio consulted ?Plan: ?--cont IV lasix 60 mg BID ?Patient not on an ACE or ARB due to allergy ?Continue low-sodium diet with fluid restriction ? ? ?Essential hypertension ?--hold atenolol ? ?Chronic kidney disease, stage 3a ([place]) ?Most likely related to known history of hypertension ?Stable ? ?Lymphedema ?Chronic ?Follows up at the wound clinic ?We will request wound care evaluation during this hospitalization ? ?Morbid obesity with BMI of 70 and over, adult [facility]) ?Complicates overall prognosis and care ?Lifestyle modification and exercise has been discussed with patient in detail ? ?OSA (obstructive sleep apnea) ?Most likely related to morbid obesity ?CPAP at bedtime ? ?Tobacco use ?Patient dips tobacco ?She has been advised to abstain from further use ? ? ? ? ?  ? ?Subjective:  ?Pt had large amount of urine output in response to IV lasix. ? ? ?Physical Exam: ? ?Constitutional: NAD, AAOx3 ?HEENT: conjunctivae and lids normal, EOMI ?CV: No cyanosis.   ?RESP: normal respiratory effort, on RA ?Extremities: severe BLE edema up to groins, with thickened skin changed over lower legs ?Neuro: II - XII grossly intact.   ?Psych: Normal mood and affect.  Appropriate judgement and reason ? ? ?Data Reviewed: ? ?Family Communication:  ? ?Disposition: ?Status is: Observation ? ? Planned Discharge Destination: Home ? ? ? ?Time spent: 50 minutes ? ?Author: ?Enzo Bi, MD ?11/02/2021 3:11 AM ? ?For on call review  [email or web address].  ?

## 2021-11-01 NOTE — Progress Notes (Signed)
*  PRELIMINARY RESULTS* ?Echocardiogram ?2D Echocardiogram has been performed. ? ?[name], [name] ?11/01/2021, 8:20 AM ?

## 2021-11-02 DIAGNOSIS — I5033 Acute on chronic diastolic (congestive) heart failure: Secondary | ICD-10-CM | POA: Diagnosis not present

## 2021-11-02 LAB — MAGNESIUM: Magnesium: 1.7 mg/dL (ref 1.7–2.4)

## 2021-11-02 LAB — CBC
HCT: 33.1 % — ABNORMAL LOW (ref 36.0–46.0)
Hemoglobin: 10.3 g/dL — ABNORMAL LOW (ref 12.0–15.0)
MCH: 26.3 pg (ref 26.0–34.0)
MCHC: 31.1 g/dL (ref 30.0–36.0)
MCV: 84.7 fL (ref 80.0–100.0)
Platelets: 150 10*3/uL (ref 150–400)
RBC: 3.91 MIL/uL (ref 3.87–5.11)
RDW: 15.2 % (ref 11.5–15.5)
WBC: 5.1 10*3/uL (ref 4.0–10.5)
nRBC: 0 % (ref 0.0–0.2)

## 2021-11-02 LAB — BASIC METABOLIC PANEL
Anion gap: 5 (ref 5–15)
BUN: 23 mg/dL (ref 8–23)
CO2: 31 mmol/L (ref 22–32)
Calcium: 8.1 mg/dL — ABNORMAL LOW (ref 8.9–10.3)
Chloride: 103 mmol/L (ref 98–111)
Creatinine, Ser: 0.94 mg/dL (ref 0.44–1.00)
GFR, Estimated: >60 mL/min (ref >60)
Glucose, Bld: 94 mg/dL (ref 70–99)
Potassium: 3.7 mmol/L (ref 3.5–5.1)
Sodium: 139 mmol/L (ref 135–145)

## 2021-11-02 MED ORDER — FUROSEMIDE 10 MG/ML IJ SOLN
60.0000 mg | Freq: Three times a day (TID) | INTRAMUSCULAR | Status: DC
  Administered 2021-11-02 – 2021-11-04 (×5): 60 mg via INTRAVENOUS
  Filled 2021-11-02 (×5): qty 6

## 2021-11-02 MED ORDER — MAGNESIUM SULFATE 2 GM/50ML IV SOLN
2.0000 g | Freq: Once | INTRAVENOUS | Status: AC
  Administered 2021-11-02: 2 g via INTRAVENOUS
  Filled 2021-11-02: qty 50

## 2021-11-02 NOTE — Care Management Obs Status (Signed)
[place] NOTIFICATION   Patient Details  Name: [name] MRN: [id number] Date of Birth: 08-Aug-1955   Medicare Observation Status Notification Given:  Yes    Laurena Slimmer, RN 11/02/2021, 9:19 AM

## 2021-11-02 NOTE — Progress Notes (Signed)
? ?  Heart Failure Nurse Navigator Note ? ?Met with patient today, she states that she has not been up out of the bed since her admission on Tuesday.  She also feels that she still has a lot of fluid in her legs. ? ?Again today discussed using 2 scales to get her weights.  I did supply her with a large scale from the heart failure clinic.  She feels that she can get another scale through her insurance. ? ?Also discussed the 2 scale method with her nurse, Lonn [place].  Asked that PT /OT work with her to make sure that she is stable to do this.  She states she does walk around her house with the aid of a cane. ? ? ?She had no further questions. ? ? ?Pricilla Riffle RN [facility] ?

## 2021-11-02 NOTE — Care Management CC44 (Signed)
Condition Code 44 Documentation Completed ? ?Patient Details  ?Name: [name] ?MRN: [id number] ?Date of Birth: 1955/09/23 ? ? ?Condition Code 44 given:  Yes ?Patient signature on Condition Code 44 notice:  Yes ?Documentation of 2 MD's agreement:  Yes ?Code 44 added to claim:  Yes ? ? ? ?Laurena Slimmer, RN ?11/02/2021, 9:19 AM ? ?

## 2021-11-02 NOTE — Progress Notes (Signed)
Assumed care of pt at 1900. A&O X4. RA. No c/o pain. Nystatin powder ordered and applied per [facility] per pt request. Purewick in place draining C/Y/U. Left leg wound drsg changed per orders. Pt followed by wound care outpt. Full assessment per flowsheets. Medication administration per MAR. Call bell within reach, pt making needs known.  ?

## 2021-11-02 NOTE — Progress Notes (Signed)
Mobility Specialist - Progress Note ? ? 11/02/21 1300  ?Mobility  ?Activity Ambulated with assistance to bathroom;Ambulated with assistance in room;Transferred from bed to chair  ?Level of Assistance Standby assist, set-up cues, supervision of patient - no hands on  ?Assistive Device Front wheel walker  ?Distance Ambulated (ft) 15 ft  ?Activity Response Tolerated well  ?$Mobility charge 1 Mobility  ? ? ? ?Pt lying in bed upon arrival, utilizing RA. Pt able to achieve EOB with supervision and extensive time---use of looped bed sheet for LE support. Pt stood at bedside and ambulated to bathroom for BM, stooped posture. Assist to perform peri-care. Pt ambulated to chair where she was left with needs in reach. No complaints. RN notified.  ? ? ?Kathee Delton ?Mobility Specialist ?11/02/21, 1:34 PM ? ?

## 2021-11-02 NOTE — Progress Notes (Signed)
?[facility] CARDIOLOGY CONSULT NOTE  ? ?    ?Patient ID: ?[name] ?MRN: [id number] ?DOB/AGE: 1956/05/04 66 y.o. ? ?Admit date: 10/31/2021 ?Referring Physician Dr. Francine Graven ?Primary Physician [facility] ?Primary Cardiologist Dr. Saralyn Pilar / Clabe Seal, PA-C ?Reason for Consultation acute on chronic HFpEF ? ?HPI: [name] is a 17yoF with a past medical history of chronic lymphedema, HFpEF (LVEF 60-35%, trivial MR 10/2021), hypertension, OSA on CPAP, morbid obesity, CKD 3 presented to [facility] ED from wound care clinic with worsening SOB. Cardiology is consulted for assistance with diuresis.  ? ?Interval History: ?-continues to feel better, net UOP ~9L over the past two days.  ?-breathing improving, ambulated to the bathroom without difficulty or significant LE weakness. Able to lift her legs.  ?-no chest pain.  ? ?Review of systems complete and found to be negative unless listed above  ? ? ?Past Medical History:  ?Diagnosis Date  ? Arthritis   ? Asthma   ? Enlarged heart   ? Hypertension   ? Lymphedema   ? Sleep apnea   ? Spinal stenosis   ?  ?Past Surgical History:  ?Procedure Laterality Date  ? COLONOSCOPY WITH PROPOFOL N/A 03/12/2018  ? Procedure: COLONOSCOPY WITH PROPOFOL;  Surgeon: Manya Silvas, MD;  Location: [facility] ENDOSCOPY;  Service: Endoscopy;  Laterality: N/A;  ? NO PAST SURGERIES    ?  ?Medications Prior to Admission  ?Medication Sig Dispense Refill Last Dose  ? acetaminophen (TYLENOL) 500 MG tablet Take 500-1,000 mg by mouth every 6 (six) hours as needed for mild pain or fever.   10/30/2021  ? atenolol (TENORMIN) 50 MG tablet Take 50 mg by mouth daily.   10/30/2021  ? cetirizine (ZYRTEC) 10 MG tablet Take 1 tablet (10 mg total) by mouth daily. 60 tablet 0 10/30/2021  ? furosemide (LASIX) 20 MG tablet Take 40 mg by mouth daily.   10/30/2021  ? pregabalin (LYRICA) 50 MG capsule Take 1 capsule (50 mg total) by mouth 2 (two) times daily. 60 capsule 0 Past Month  ? hydrOXYzine (ATARAX) 10 MG tablet  Take 1 tablet (10 mg total) by mouth 3 (three) times daily as needed for itching. (Patient not taking: Reported on 10/31/2021) 30 tablet 0 Not Taking  ? methocarbamol (ROBAXIN) 500 MG tablet Take 1 tablet (500 mg total) by mouth every 8 (eight) hours as needed for muscle spasms. (Patient not taking: Reported on 10/31/2021) 15 tablet 0 Not Taking  ? torsemide (DEMADEX) 20 MG tablet Take 1 tablet (20 mg total) by mouth daily. (Patient not taking: Reported on 10/31/2021) 30 tablet 0 Not Taking  ? ? ?Social History  ? ?Socioeconomic History  ? Marital status: Single  ?  Spouse name: Not on file  ? Number of children: Not on file  ? Years of education: Not on file  ? Highest education level: Not on file  ?Occupational History  ? Not on file  ?Tobacco Use  ? Smoking status: Former  ?  Packs/day: 0.25  ?  Types: Cigarettes  ?  Start date: 07/09/1976  ?  Quit date: 07/09/1990  ?  Years since quitting: 31.3  ? Smokeless tobacco: Current  ?  Types: Snuff  ?Vaping Use  ? Vaping Use: Never used  ?Substance and Sexual Activity  ? Alcohol use: No  ? Drug use: No  ? Sexual activity: Not on file  ?Other Topics Concern  ? Not on file  ?Social History Narrative  ? Not on file  ? ?Social  Determinants of Health  ? ?Financial Resource Strain: Not on file  ?Food Insecurity: Not on file  ?Transportation Needs: Not on file  ?Physical Activity: Not on file  ?Stress: Not on file  ?Social Connections: Not on file  ?Intimate Partner Violence: Not on file  ?  ?Family History  ?Problem Relation Age of Onset  ? Thyroid disease Other   ? Breast cancer Maternal Aunt   ?  ? ?PHYSICAL EXAM ?General: Pleasant black female, well nourished, in no acute distress.  Sitting in recliner. ?HEENT:  Normocephalic and atraumatic. ?Neck:  No JVD.  ?Lungs: Normal respiratory effort on room air.  Decreased breath sounds without appreciable crackles or wheezes ?Heart:  brady RRR . Normal S1 and S2 without gallops or murmurs.  ?Abdomen: Obese appearing.  ?Msk: Normal  strength and tone for age. ?Extremities: Warm and well perfused.  Extreme bilateral lower extremity lymphedema with chronic dimpling and associated erythema, tightness improving gradually ?Neuro: Alert and oriented X 3. ?Psych:  Answers questions appropriately.  ? ?Labs: ?  ?Lab Results  ?Component Value Date  ? WBC 5.1 11/02/2021  ? HGB 10.3 (L) 11/02/2021  ? HCT 33.1 (L) 11/02/2021  ? MCV 84.7 11/02/2021  ? PLT 150 11/02/2021  ?  ?Recent Labs  ?Lab 10/31/21 ?1248 11/01/21 ?9458 11/02/21 ?5929  ?NA 140   < > 139  ?K 3.9   < > 3.7  ?CL 104   < > 103  ?CO2 25   < > 31  ?BUN 21   < > 23  ?CREATININE 1.04*   < > 0.94  ?CALCIUM 9.1   < > 8.1*  ?PROT 8.9*  --   --   ?BILITOT 0.6  --   --   ?ALKPHOS 135*  --   --   ?ALT 18  --   --   ?AST 24  --   --   ?GLUCOSE 81   < > 94  ? < > = values in this interval not displayed.  ? ? ?Lab Results  ?Component Value Date  ? TROPONINI <0.03 10/25/2018  ? ? No results found for: CHOL ?No results found for: HDL ?No results found for: [place] ?No results found for: TRIG ?No results found for: CHOLHDL ?No results found for: LDLDIRECT  ?  ?Radiology: US Venous Img Lower Bilateral (DVT) ? ?Result Date: 10/03/2021 ?CLINICAL DATA:  Bilateral lower extremity edema. EXAM: BILATERAL LOWER EXTREMITY VENOUS DOPPLER ULTRASOUND TECHNIQUE: Gray-scale sonography with graded compression, as well as color Doppler and duplex ultrasound were performed to evaluate the lower extremity deep venous systems from the level of the common femoral vein and including the common femoral, femoral, profunda femoral, popliteal and calf veins including the posterior tibial, peroneal and gastrocnemius veins when visible. The superficial great saphenous vein was also interrogated. Spectral Doppler was utilized to evaluate flow at rest and with distal augmentation maneuvers in the common femoral, femoral and popliteal veins. COMPARISON:  None. FINDINGS: RIGHT LOWER EXTREMITY Common Femoral Vein: No evidence of thrombus.  Normal compressibility, respiratory phasicity and response to augmentation. Saphenofemoral Junction: No evidence of thrombus. Normal compressibility and flow on color Doppler imaging. Profunda Femoral Vein: No evidence of thrombus. Normal compressibility and flow on color Doppler imaging. Femoral Vein: No evidence of thrombus. Normal compressibility, respiratory phasicity and response to augmentation. Popliteal Vein: Not visualized. Calf Veins: Not visualized due to marked edema. Superficial Great Saphenous Vein: No evidence of thrombus. Normal compressibility. Venous Reflux:  None. Other Findings:  None.  LEFT LOWER EXTREMITY Common Femoral Vein: No evidence of thrombus. Normal compressibility, respiratory phasicity and response to augmentation. Saphenofemoral Junction: No evidence of thrombus. Normal compressibility and flow on color Doppler imaging. Profunda Femoral Vein: No evidence of thrombus. Normal compressibility and flow on color Doppler imaging. Femoral Vein: No evidence of thrombus. Normal compressibility, respiratory phasicity and response to augmentation. Popliteal Vein: Not visualized. Calf Veins: Not visualized due to marked edema. Superficial Great Saphenous Vein: No evidence of thrombus. Normal compressibility. Venous Reflux:  None. Other Findings:  None. IMPRESSION: Limited examination due to body habitus and marked inflammation/edema involving bilateral lower extremities. The popliteal and calf veins could not be identified. No findings for deep venous thrombosis above this level. Electronically Signed   By: Marijo Sanes M.D.   On: 10/03/2021 16:17  ? ?DG Chest Port 1 View ? ?Result Date: 10/31/2021 ?CLINICAL DATA:  Short of breath EXAM: PORTABLE CHEST 1 VIEW COMPARISON:  01/19/2021 FINDINGS: Stable enlarged cardiac silhouette. There is central venous congestion similar prior. No effusion, infiltrate pneumothorax. No acute osseous abnormality. IMPRESSION: Cardiomegaly and central venous congestion  similar to comparison exam. Electronically Signed   By: Suzy Bouchard M.D.   On: 10/31/2021 13:19  ? ?ECHOCARDIOGRAM COMPLETE ? ?Result Date: 11/01/2021 ?   ECHOCARDIOGRAM REPORT   Patient Name:   [name]

## 2021-11-02 NOTE — Progress Notes (Signed)
?  Progress Note ? ? ?Patient: [name] GNF:[id number] DOB: September 07, 1955 DOA: 10/31/2021     1 ?DOS: the patient was seen and examined on 11/02/2021 ?  ?Brief hospital course: ?No notes on file ? ?Assessment and Plan: ?* Acute on chronic diastolic CHF (congestive heart failure) ([place]) ?Patient presents for evaluation of worsening shortness of breath with exertion associated with orthopnea and worsening bilateral lower extremity swelling ?Last 2D echocardiogram from 01/23 shows an LVEF of greater than 55% ?--cardio consulted ?Plan: ?--increase IV lasix 60 mg to TID ?--Continue low-sodium diet with fluid restriction ? ? ?Essential hypertension ?--hold atenolol to allow more BP room for diuresis ? ?Chronic kidney disease, stage 3a ([place]) ?Most likely related to known history of hypertension ?Stable ? ?Lymphedema ?Chronic ?--IV diuresis for fluid removal ? ?Morbid obesity with BMI of 70 and over, adult [facility]) ?Complicates overall prognosis and care ?Lifestyle modification and exercise has been discussed with patient in detail ? ?OSA (obstructive sleep apnea) ?Most likely related to morbid obesity ?CPAP at bedtime ? ?Tobacco use ?Patient dips tobacco ?She has been advised to abstain from further use ? ? ? ? ?  ? ?Subjective:  ?Pt continued to have large amount of urine output.   ? ? ?Physical Exam: ? ?Constitutional: NAD, AAOx3 ?HEENT: conjunctivae and lids normal, EOMI ?CV: No cyanosis.   ?RESP: normal respiratory effort, on RA ?Neuro: II - XII grossly intact.   ?Psych: Normal mood and affect.  Appropriate judgement and reason ? ?4/25 ? ? ? ?4/27 ? ? ? ?Data Reviewed: ? ?Family Communication:  ? ?Disposition: ?Status is: Observation ? ? Planned Discharge Destination: Home ? ? ? ?Time spent: 35 minutes ? ?Author: ?Enzo Bi, MD ?11/02/2021 7:41 PM ? ?For on call review [email or web address].  ?

## 2021-11-03 DIAGNOSIS — L97811 Non-pressure chronic ulcer of other part of right lower leg limited to breakdown of skin: Secondary | ICD-10-CM | POA: Diagnosis present

## 2021-11-03 DIAGNOSIS — L97821 Non-pressure chronic ulcer of other part of left lower leg limited to breakdown of skin: Secondary | ICD-10-CM | POA: Diagnosis present

## 2021-11-03 DIAGNOSIS — I130 Hypertensive heart and chronic kidney disease with heart failure and stage 1 through stage 4 chronic kidney disease, or unspecified chronic kidney disease: Secondary | ICD-10-CM | POA: Diagnosis present

## 2021-11-03 DIAGNOSIS — J45909 Unspecified asthma, uncomplicated: Secondary | ICD-10-CM | POA: Diagnosis present

## 2021-11-03 DIAGNOSIS — Z881 Allergy status to other antibiotic agents status: Secondary | ICD-10-CM | POA: Diagnosis not present

## 2021-11-03 DIAGNOSIS — F17220 Nicotine dependence, chewing tobacco, uncomplicated: Secondary | ICD-10-CM | POA: Diagnosis present

## 2021-11-03 DIAGNOSIS — N1831 Chronic kidney disease, stage 3a: Secondary | ICD-10-CM | POA: Diagnosis present

## 2021-11-03 DIAGNOSIS — Z886 Allergy status to analgesic agent status: Secondary | ICD-10-CM | POA: Diagnosis not present

## 2021-11-03 DIAGNOSIS — I890 Lymphedema, not elsewhere classified: Secondary | ICD-10-CM | POA: Diagnosis present

## 2021-11-03 DIAGNOSIS — Z79899 Other long term (current) drug therapy: Secondary | ICD-10-CM | POA: Diagnosis not present

## 2021-11-03 DIAGNOSIS — G4733 Obstructive sleep apnea (adult) (pediatric): Secondary | ICD-10-CM | POA: Diagnosis present

## 2021-11-03 DIAGNOSIS — E1122 Type 2 diabetes mellitus with diabetic chronic kidney disease: Secondary | ICD-10-CM | POA: Diagnosis present

## 2021-11-03 DIAGNOSIS — Z6841 Body Mass Index (BMI) 40.0 and over, adult: Secondary | ICD-10-CM | POA: Diagnosis not present

## 2021-11-03 DIAGNOSIS — E11621 Type 2 diabetes mellitus with foot ulcer: Secondary | ICD-10-CM | POA: Diagnosis present

## 2021-11-03 DIAGNOSIS — I5033 Acute on chronic diastolic (congestive) heart failure: Secondary | ICD-10-CM | POA: Diagnosis present

## 2021-11-03 DIAGNOSIS — Z888 Allergy status to other drugs, medicaments and biological substances status: Secondary | ICD-10-CM | POA: Diagnosis not present

## 2021-11-03 LAB — BASIC METABOLIC PANEL
Anion gap: 7 (ref 5–15)
BUN: 22 mg/dL (ref 8–23)
CO2: 32 mmol/L (ref 22–32)
Calcium: 8.2 mg/dL — ABNORMAL LOW (ref 8.9–10.3)
Chloride: 99 mmol/L (ref 98–111)
Creatinine, Ser: 0.96 mg/dL (ref 0.44–1.00)
GFR, Estimated: >60 mL/min (ref >60)
Glucose, Bld: 98 mg/dL (ref 70–99)
Potassium: 3.8 mmol/L (ref 3.5–5.1)
Sodium: 138 mmol/L (ref 135–145)

## 2021-11-03 LAB — MAGNESIUM: Magnesium: 2 mg/dL (ref 1.7–2.4)

## 2021-11-03 LAB — CBC
HCT: 34.5 % — ABNORMAL LOW (ref 36.0–46.0)
Hemoglobin: 10.6 g/dL — ABNORMAL LOW (ref 12.0–15.0)
MCH: 26.2 pg (ref 26.0–34.0)
MCHC: 30.7 g/dL (ref 30.0–36.0)
MCV: 85.2 fL (ref 80.0–100.0)
Platelets: 152 10*3/uL (ref 150–400)
RBC: 4.05 MIL/uL (ref 3.87–5.11)
RDW: 15.2 % (ref 11.5–15.5)
WBC: 5.5 10*3/uL (ref 4.0–10.5)
nRBC: 0 % (ref 0.0–0.2)

## 2021-11-03 MED ORDER — ATENOLOL 25 MG PO TABS
25.0000 mg | ORAL_TABLET | Freq: Every day | ORAL | Status: DC
  Administered 2021-11-03 – 2021-11-06 (×4): 25 mg via ORAL
  Filled 2021-11-03 (×4): qty 1

## 2021-11-03 MED ORDER — HYDROCORTISONE 0.5 % EX CREA
TOPICAL_CREAM | CUTANEOUS | Status: DC | PRN
  Filled 2021-11-03: qty 28.35

## 2021-11-03 NOTE — Progress Notes (Signed)
?[facility] CARDIOLOGY CONSULT NOTE  ? ?    ?Patient ID: ?[name] ?MRN: [id number] ?DOB/AGE: 1955/07/12 66 y.o. ? ?Admit date: 10/31/2021 ?Referring Physician Dr. Francine Graven ?Primary Physician [facility] ?Primary Cardiologist Dr. Saralyn Pilar / Clabe Seal, PA-C ?Reason for Consultation acute on chronic HFpEF ? ?HPI: [name] is a 66yoF with a past medical history of chronic lymphedema, HFpEF (LVEF 60-35%, trivial MR 10/2021), hypertension, OSA on CPAP, morbid obesity, CKD 3 presented to [facility] ED from wound care clinic with worsening SOB. Cardiology is consulted for assistance with diuresis.  ? ?Interval History: ?-feels fatigued this morning but overall feeling better, net UOP 16L since admission. Weight 190kg - 175kg ?-can lift her lower legs without assistance, feeling less tight and swollen ?-no chest pain or SOB ? ?Review of systems complete and found to be negative unless listed above  ? ? ?Past Medical History:  ?Diagnosis Date  ? Arthritis   ? Asthma   ? Enlarged heart   ? Hypertension   ? Lymphedema   ? Sleep apnea   ? Spinal stenosis   ?  ?Past Surgical History:  ?Procedure Laterality Date  ? COLONOSCOPY WITH PROPOFOL N/A 03/12/2018  ? Procedure: COLONOSCOPY WITH PROPOFOL;  Surgeon: Manya Silvas, MD;  Location: [facility] ENDOSCOPY;  Service: Endoscopy;  Laterality: N/A;  ? NO PAST SURGERIES    ?  ?Medications Prior to Admission  ?Medication Sig Dispense Refill Last Dose  ? acetaminophen (TYLENOL) 500 MG tablet Take 500-1,000 mg by mouth every 6 (six) hours as needed for mild pain or fever.   10/30/2021  ? atenolol (TENORMIN) 50 MG tablet Take 50 mg by mouth daily.   10/30/2021  ? cetirizine (ZYRTEC) 10 MG tablet Take 1 tablet (10 mg total) by mouth daily. 60 tablet 0 10/30/2021  ? furosemide (LASIX) 20 MG tablet Take 40 mg by mouth daily.   10/30/2021  ? pregabalin (LYRICA) 50 MG capsule Take 1 capsule (50 mg total) by mouth 2 (two) times daily. 60 capsule 0 Past Month  ? hydrOXYzine (ATARAX) 10 MG  tablet Take 1 tablet (10 mg total) by mouth 3 (three) times daily as needed for itching. (Patient not taking: Reported on 10/31/2021) 30 tablet 0 Not Taking  ? methocarbamol (ROBAXIN) 500 MG tablet Take 1 tablet (500 mg total) by mouth every 8 (eight) hours as needed for muscle spasms. (Patient not taking: Reported on 10/31/2021) 15 tablet 0 Not Taking  ? torsemide (DEMADEX) 20 MG tablet Take 1 tablet (20 mg total) by mouth daily. (Patient not taking: Reported on 10/31/2021) 30 tablet 0 Not Taking  ? ? ?Social History  ? ?Socioeconomic History  ? Marital status: Single  ?  Spouse name: Not on file  ? Number of children: Not on file  ? Years of education: Not on file  ? Highest education level: Not on file  ?Occupational History  ? Not on file  ?Tobacco Use  ? Smoking status: Former  ?  Packs/day: 0.25  ?  Types: Cigarettes  ?  Start date: 07/09/1976  ?  Quit date: 07/09/1990  ?  Years since quitting: 31.3  ? Smokeless tobacco: Current  ?  Types: Snuff  ?Vaping Use  ? Vaping Use: Never used  ?Substance and Sexual Activity  ? Alcohol use: No  ? Drug use: No  ? Sexual activity: Not on file  ?Other Topics Concern  ? Not on file  ?Social History Narrative  ? Not on file  ? ?Social Determinants  of Health  ? ?Financial Resource Strain: Not on file  ?Food Insecurity: Not on file  ?Transportation Needs: Not on file  ?Physical Activity: Not on file  ?Stress: Not on file  ?Social Connections: Not on file  ?Intimate Partner Violence: Not on file  ?  ?Family History  ?Problem Relation Age of Onset  ? Thyroid disease Other   ? Breast cancer Maternal Aunt   ?  ? ?PHYSICAL EXAM ?General: Pleasant black female, well nourished, in no acute distress.  Sitting in PCU bed ?HEENT:  Normocephalic and atraumatic. ?Neck:  No JVD.  ?Lungs: Normal respiratory effort on room air.  Decreased breath sounds without appreciable crackles or wheezes ?Heart:  brady RRR . Normal S1 and S2 without gallops or murmurs.  ?Abdomen: Obese appearing.  ?Msk: Normal  strength and tone for age. ?Extremities: Warm and well perfused.  Extreme bilateral lower extremity lymphedema with chronic dimpling and associated erythema, tightness improving gradually ?Neuro: Alert and oriented X 3. ?Psych:  Answers questions appropriately.  ? ?Labs: ?  ?Lab Results  ?Component Value Date  ? WBC 5.5 11/03/2021  ? HGB 10.6 (L) 11/03/2021  ? HCT 34.5 (L) 11/03/2021  ? MCV 85.2 11/03/2021  ? PLT 152 11/03/2021  ?  ?Recent Labs  ?Lab 10/31/21 ?1248 11/01/21 ?8786 11/03/21 ?0615  ?NA 140   < > 138  ?K 3.9   < > 3.8  ?CL 104   < > 99  ?CO2 25   < > 32  ?BUN 21   < > 22  ?CREATININE 1.04*   < > 0.96  ?CALCIUM 9.1   < > 8.2*  ?PROT 8.9*  --   --   ?BILITOT 0.6  --   --   ?ALKPHOS 135*  --   --   ?ALT 18  --   --   ?AST 24  --   --   ?GLUCOSE 81   < > 98  ? < > = values in this interval not displayed.  ? ? ?Lab Results  ?Component Value Date  ? TROPONINI <0.03 10/25/2018  ? ? No results found for: CHOL ?No results found for: HDL ?No results found for: [place] ?No results found for: TRIG ?No results found for: CHOLHDL ?No results found for: LDLDIRECT  ?  ?Radiology: [facility] Chest Port 1 View ? ?Result Date: 10/31/2021 ?CLINICAL DATA:  Short of breath EXAM: PORTABLE CHEST 1 VIEW COMPARISON:  01/19/2021 FINDINGS: Stable enlarged cardiac silhouette. There is central venous congestion similar prior. No effusion, infiltrate pneumothorax. No acute osseous abnormality. IMPRESSION: Cardiomegaly and central venous congestion similar to comparison exam. Electronically Signed   By: Suzy Bouchard M.D.   On: 10/31/2021 13:19  ? ?ECHOCARDIOGRAM COMPLETE ? ?Result Date: 11/01/2021 ?   ECHOCARDIOGRAM REPORT   Patient Name:   [name] Date of Exam: 11/01/2021 Medical Rec #:  [id number]           Height:       63.5 in Accession #:    [id number]          Weight:       419.3 lb Date of Birth:  12/28/1955           BSA:          2.664 m? Patient Age:    66 years            BP:           121/70 mmHg Patient Gender: F  HR:           68 bpm. Exam Location:  [facility] Procedure: 2D Echo, Cardiac Doppler and Color Doppler Indications:     CHF-acute diastolic U20.25  History:         Patient has prior history of Echocardiogram examinations, most                  recent 04/13/2019. Risk Factors:Hypertension and Sleep Apnea.                  Enlarged heart.  Sonographer:     Sherrie Sport Referring Phys:  [id number] AGBATA Diagnosing Phys: Isaias Cowman MD  Sonographer Comments: Suboptimal parasternal window. IMPRESSIONS  1. Left ventricular ejection fraction, by estimation, is 60 to 65%. The left ventricle has normal function. The left ventricle has no regional wall motion abnormalities. Left ventricular diastolic parameters were normal.  2. Right ventricular systolic function is normal. The right ventricular size is normal.  3. The mitral valve is normal in structure. Trivial mitral valve regurgitation. No evidence of mitral stenosis.  4. The aortic valve is normal in structure. Aortic valve regurgitation is not visualized. No aortic stenosis is present.  5. The inferior vena cava is normal in size with greater than 50% respiratory variability, suggesting right atrial pressure of 3 mmHg. FINDINGS  Left Ventricle: Left ventricular ejection fraction, by estimation, is 60 to 65%. The left ventricle has normal function. The left ventricle has no regional wall motion abnormalities. The left ventricular internal cavity size was normal in size. There is  no left ventricular hypertrophy. Left ventricular diastolic parameters were normal. Right Ventricle: The right ventricular size is normal. No increase in right ventricular wall thickness. Right ventricular systolic function is normal. Left Atrium: Left atrial size was normal in size. Right Atrium: Right atrial size was normal in size. Pericardium: There is no evidence of pericardial effusion. Mitral Valve: The mitral valve is normal in structure. Trivial mitral valve  regurgitation. No evidence of mitral valve stenosis. MV peak gradient, 8.4 mmHg. The mean mitral valve gradient is 3.0 mmHg. Tricuspid Valve: The tricuspid valve is normal in structure. Tricuspid valve regurgit

## 2021-11-03 NOTE — TOC Progression Note (Signed)
Transition of Care (TOC) - Progression Note  ? ? ?Patient Details  ?Name: [name] ?MRN: [id number] ?Date of Birth: [birth year] ? ?Transition of Care (TOC) CM/SW Contact  ?Laurena Slimmer, RN ?Phone Number: ?11/03/2021, 4:12 PM ? ?Clinical Narrative:    ?Per therapy recommendation patient would require [place] PT. Patient refuses.  ? ?  ?  ? ?Expected Discharge Plan and Services ?  ?  ?  ?  ?  ?                ?  ?  ?  ?  ?  ?  ?  ?  ?  ?  ? ? ?Social Determinants of Health (SDOH) Interventions ?  ? ?Readmission Risk Interventions ? ?  01/20/2021  ?  9:48 AM  ?Readmission Risk Prevention Plan  ?Transportation Screening Complete  ?PCP or Specialist Appt within 5-7 Days Complete  ?Home Care Screening Complete  ?Medication Review (RN CM) Referral to Pharmacy  ? ? ?

## 2021-11-03 NOTE — Progress Notes (Signed)
Assumed care of pt at 1900. A&O x4. RA. No c/o pain. Patient w/ large amounts of C/Y/U overnight. I&Os as documented in flowsheets. Medication administration per MAR. Bilateral ankle drsgs changed per orders. See flowsheets for full assessment. Call bell within reach, pt making needs known.  ?

## 2021-11-03 NOTE — Progress Notes (Signed)
?  Progress Note ? ? ?Patient: [name] TKZ:[id number] DOB: January 25, 1956 DOA: 10/31/2021     1 ?DOS: the patient was seen and examined on 11/03/2021 ?  ?Brief hospital course: ?No notes on file ? ?Assessment and Plan: ?* Acute on chronic diastolic CHF (congestive heart failure) ([place]) ?Patient presents for evaluation of worsening shortness of breath with exertion associated with orthopnea and worsening bilateral lower extremity swelling ?Last 2D echocardiogram from 01/23 shows an LVEF of greater than 55% ?--cardio consulted ?Plan: ?--cont IV lasix 60 mg to TID ?--Continue low-sodium diet with fluid restriction ? ? ?Essential hypertension ?--hold atenolol to allow more BP room for diuresis ? ?Chronic kidney disease, stage 3a ([place]) ?Most likely related to known history of hypertension ?Stable ? ?Lymphedema ?Chronic ?--IV diuresis for fluid removal ? ?Morbid obesity with BMI of 70 and over, adult [facility]) ?Complicates overall prognosis and care ?Lifestyle modification and exercise has been discussed with patient in detail ? ?OSA (obstructive sleep apnea) ?Most likely related to morbid obesity ?CPAP at bedtime ? ?Tobacco use ?Patient dips tobacco ?She has been advised to abstain from further use ? ? ? ? ?  ? ?Subjective:  ?Pt continued to have large amount of urine output, 8.7L for the past day. ? ? ?Physical Exam: ? ?Constitutional: NAD, AAOx3, sitting  ?HEENT: conjunctivae and lids normal, EOMI ?CV: No cyanosis.   ?RESP: normal respiratory effort, on RA ?Extremities: legs decreased in size, now showing some skin folds ?SKIN: warm, dry ?Neuro: II - XII grossly intact.   ?Psych: Normal mood and affect.  Appropriate judgement and reason ? ? ?4/27 ? ? ?4/25 ? ? ? ? ?Data Reviewed: ? ?Family Communication:  ? ?Disposition: ?Status is: changed to inpatient ? ? Planned Discharge Destination: Home ? ? ? ?Time spent: 35 minutes ? ?Author: ?Enzo Bi, MD ?11/03/2021 11:58 PM ? ?For on call review [email or web address]  ?

## 2021-11-03 NOTE — Evaluation (Signed)
Physical Therapy Evaluation ?Patient Details ?Name: [name] ?MRN: [id number] ?DOB: 04-23-1956 ?Today's Date: 11/03/2021 ? ?History of Present Illness ? [name] is a 64yoF who comes to [facility] on 10/31/21 with progressive SOB and weight gain, admitted with A/C CHF. PMH: lymphedema, HTN, OSA on CPAP, morbid obesity, CKD3. Pt recently admitted for similar issue, but per pt home RX diuretic was no working with resultant rapid increase in volume.  ?Clinical Impression ?  ? Pt admitted with above diagnosis. Pt currently with functional limitations due to the deficits listed below (see "PT Problem List"). Upon entry, pt in bed, awake and agreeable to participate. The pt is alert, pleasant, interactive, and able to provide info regarding prior level of function, both in tolerance and independence. Pt able to perform bed mobility at modI, STS from EOB at supervision level without any safety or technique cues, fwd and retro AMB in room without LOB or need for safety cues. PT reports mobility and strength are near baseline, albeit per MD she still has some remaining volume overload and need for diuresis. Patient's performance this date reveals decreased ability, independence, and tolerance in performing all basic mobility required for performance of activities of daily living. Pt requires additional DME, close physical assistance, and cues for safe participate in mobility. Pt will benefit from skilled PT intervention to increase independence and safety with basic mobility in preparation for discharge to the venue listed below.   ? ?  ? ?Recommendations for follow up therapy are one component of a multi-disciplinary discharge planning process, led by the attending physician.  Recommendations may be updated based on patient status, additional functional criteria and insurance authorization. ? ?Follow Up Recommendations No PT follow up (pt reports confidence in abilities, veyr near her recent baseline.) ? ?   ?Assistance Recommended at Discharge None  ?Patient can return home with the following ? Assist for transportation;Assistance with cooking/housework ? ?  ?Equipment Recommendations None recommended by PT  ?Recommendations for Other Services ?    ?  ?Functional Status Assessment Patient has had a recent decline in their functional status and/or demonstrates limited ability to make significant improvements in function in a reasonable and predictable amount of time  ? ?  ?Precautions / Restrictions Precautions ?Precautions: Fall ?Restrictions ?Weight Bearing Restrictions: No  ? ?  ? ?Mobility ? Bed Mobility ?Overal bed mobility: Modified Independent ?Bed Mobility: Supine to Sit ?  ?  ?Supine to sit: Modified independent (Device/Increase time) ?  ?  ?General bed mobility comments: well adapted strategy for use of HOB elevation and assistive sling for LE movement ?  ? ?Transfers ?Overall transfer level: Needs assistance ?Equipment used: Rolling walker (2 wheels) (brw) ?Transfers: Sit to/from Stand ?Sit to Stand: Supervision ?  ?  ?  ?  ?  ?General transfer comment: high velocity trunk thrusting maneuver in veyr wide stance, mechanics akin to sumo dead lift, then requires steady assist to reduce stance width ?  ? ?Ambulation/Gait ?  ?Gait Distance (Feet): 40 Feet ?Assistive device:  (brw) ?  ?  ?  ?  ?General Gait Details: alternating fwd adn retro stepping at bedside ? ?Stairs ?  ?  ?  ?  ?  ? ?Wheelchair Mobility ?  ? ?Modified Rankin (Stroke Patients Only) ?  ? ?  ? ?Balance Overall balance assessment: Modified Independent ?  ?  ?  ?  ?  ?  ?  ?  ?  ?  ?  ?  ?  ?  ?  ?  ?  ?  ?   ? ? ? ?  Pertinent Vitals/Pain Pain Assessment ?Pain Assessment: No/denies pain  ? ? ?Home Living Family/patient expects to be discharged to:: Private residence ?Living Arrangements: Children (Son, Gerrit Heck) ?Available Help at Discharge: Family;Available PRN/intermittently ?Type of Home: House ?Home Access: Stairs to enter ?Entrance  Stairs-Rails: Right ?Entrance Stairs-Number of Steps: 1 partial step to stoop ?  ?Home Layout: One level ?Home Equipment: Cane - quad;BSC/3in1;Grab bars - tub/shower;Hand held shower head;Grab bars - toilet;Hospital bed;Toilet riser;Rollator (4 wheels);Rolling Walker (2 wheels) ?Additional Comments: Lift chair/recliner, sometimes in hospital bed  ?  ?Prior Function Prior Level of Function : Independent/Modified Independent ?  ?  ?  ?  ?  ?  ?Mobility Comments: with QC and occasional Rollator; takes transport van to appointments; has been using bariatric RW more recently rather than rollator ?  ?  ? ? ?Hand Dominance  ? Dominant Hand: Right ? ?  ?Extremity/Trunk Assessment  ? Upper Extremity Assessment ?Upper Extremity Assessment: Generalized weakness;Overall [facility] for tasks assessed ?  ? ?Lower Extremity Assessment ?Lower Extremity Assessment: Generalized weakness;Overall [facility] for tasks assessed ?  ? ?   ?Communication  ? Communication: No difficulties  ?Cognition Arousal/Alertness: Awake/alert ?Behavior During Therapy: [facility] for tasks assessed/performed ?Overall Cognitive Status: Within Functional Limits for tasks assessed ?  ?  ?  ?  ?  ?  ?  ?  ?  ?  ?  ?  ?  ?  ?  ?  ?  ?  ?  ? ?  ?General Comments   ? ?  ?Exercises    ? ?Assessment/Plan  ?  ?PT Assessment Patient needs continued PT services  ?PT Problem List Decreased strength ? ?   ?  ?PT Treatment Interventions DME instruction;Balance training;Gait training;Stair training;Functional mobility training;Therapeutic activities;Therapeutic exercise;Patient/family education   ? ?PT Goals (Current goals can be found in the Care Plan section)  ?Acute Rehab PT Goals ?Patient Stated Goal: return to home and remain independent with ADL, progress back to AMB for grocery shopping ?PT Goal Formulation: With patient ?Time For Goal Achievement: 11/17/21 ?Potential to Achieve Goals: Good ? ?  ?Frequency Min 2X/week ?  ? ? ?Co-evaluation   ?  ?  ?  ?  ? ? ?  ?AM-PAC PT "6 Clicks"  Mobility  ?Outcome Measure Help needed turning from your back to your side while in a flat bed without using bedrails?: A Lot ?Help needed moving from lying on your back to sitting on the side of a flat bed without using bedrails?: A Little ?Help needed moving to and from a bed to a chair (including a wheelchair)?: A Little ?Help needed standing up from a chair using your arms (e.g., wheelchair or bedside chair)?: A Little ?Help needed to walk in hospital room?: A Little ?Help needed climbing 3-5 steps with a railing? : A Lot ?6 Click Score: 16 ? ?  ?End of Session   ?Activity Tolerance: Patient tolerated treatment well ?Patient left: in bed (seated EOB) ?  ?PT Visit Diagnosis: Difficulty in walking, not elsewhere classified (R26.2);Unsteadiness on feet (R26.81);Other abnormalities of gait and mobility (R26.89);Muscle weakness (generalized) (M62.81) ?  ? ?Time: 1191-4782 ?PT Time Calculation (min) (ACUTE ONLY): 36 min ? ? ?Charges:   PT Evaluation ?$PT Eval Moderate Complexity: 1 Mod ?PT Treatments ?$Therapeutic Exercise: 8-22 mins ?  ?   ? ?3:21 PM, 11/03/21 ?Etta Grandchild, PT, DPT ?Physical Therapist - [place] ?[facility]  ?[phone] (ASCOM)  ? ? ?Kuuipo Anzaldo C ?11/03/2021, 3:19 PM ? ?

## 2021-11-04 DIAGNOSIS — I5033 Acute on chronic diastolic (congestive) heart failure: Secondary | ICD-10-CM | POA: Diagnosis not present

## 2021-11-04 LAB — BASIC METABOLIC PANEL
Anion gap: 8 (ref 5–15)
BUN: 26 mg/dL — ABNORMAL HIGH (ref 8–23)
CO2: 33 mmol/L — ABNORMAL HIGH (ref 22–32)
Calcium: 8.5 mg/dL — ABNORMAL LOW (ref 8.9–10.3)
Chloride: 96 mmol/L — ABNORMAL LOW (ref 98–111)
Creatinine, Ser: 1.07 mg/dL — ABNORMAL HIGH (ref 0.44–1.00)
GFR, Estimated: 57 mL/min — ABNORMAL LOW (ref >60)
Glucose, Bld: 112 mg/dL — ABNORMAL HIGH (ref 70–99)
Potassium: 3.7 mmol/L (ref 3.5–5.1)
Sodium: 137 mmol/L (ref 135–145)

## 2021-11-04 LAB — CBC
HCT: 36.4 % (ref 36.0–46.0)
Hemoglobin: 11.2 g/dL — ABNORMAL LOW (ref 12.0–15.0)
MCH: 25.9 pg — ABNORMAL LOW (ref 26.0–34.0)
MCHC: 30.8 g/dL (ref 30.0–36.0)
MCV: 84.3 fL (ref 80.0–100.0)
Platelets: 159 10*3/uL (ref 150–400)
RBC: 4.32 MIL/uL (ref 3.87–5.11)
RDW: 14.9 % (ref 11.5–15.5)
WBC: 5.8 10*3/uL (ref 4.0–10.5)
nRBC: 0 % (ref 0.0–0.2)

## 2021-11-04 LAB — MAGNESIUM: Magnesium: 1.8 mg/dL (ref 1.7–2.4)

## 2021-11-04 MED ORDER — FUROSEMIDE 10 MG/ML IJ SOLN
60.0000 mg | Freq: Two times a day (BID) | INTRAMUSCULAR | Status: DC
  Administered 2021-11-05 (×2): 60 mg via INTRAVENOUS
  Filled 2021-11-04 (×2): qty 6

## 2021-11-04 MED ORDER — ENOXAPARIN SODIUM 100 MG/ML IJ SOSY
0.5000 mg/kg | PREFILLED_SYRINGE | Freq: Every day | INTRAMUSCULAR | Status: DC
Start: 2021-11-04 — End: 2021-11-06
  Administered 2021-11-04 – 2021-11-05 (×2): 85 mg via SUBCUTANEOUS
  Filled 2021-11-04 (×2): qty 0.85

## 2021-11-04 NOTE — Progress Notes (Signed)
?  Progress Note ? ? ?Patient: [name] HQP:[id number] DOB: 1956-02-05 DOA: 10/31/2021     2 ?DOS: the patient was seen and examined on 11/04/2021 ?  ?Brief hospital course: ?No notes on file ? ?Assessment and Plan: ?* Acute on chronic diastolic CHF (congestive heart failure) ([place]) ?Patient presents for evaluation of worsening shortness of breath with exertion associated with orthopnea and worsening bilateral lower extremity swelling ?Last 2D echocardiogram from 01/23 shows an LVEF of greater than 55% ?--cardio consulted ?Plan: ?--cont IV lasix 60 mg, 1 dose today ?--Continue low-sodium diet with fluid restriction ? ? ?Essential hypertension ?--cont atenolol at reduced 25 mg daily ? ?Chronic kidney disease, stage 3a ([place]) ?Most likely related to known history of hypertension ?Stable ? ?Lymphedema ?Chronic ?--IV diuresis for fluid removal ? ?Morbid obesity with BMI of 70 and over, adult [facility]) ?Complicates overall prognosis and care ?Lifestyle modification and exercise has been discussed with patient in detail ? ?OSA (obstructive sleep apnea) ?Most likely related to morbid obesity ?CPAP at bedtime ? ?Tobacco use ?Patient dips tobacco ?She has been advised to abstain from further use ? ? ? ? ?  ? ?Subjective:  ?Cr and BUN increased a little, so IV lasix reduced to 1 dose today. ? ? ?Physical Exam: ? ?Constitutional: NAD, AAOx3 ?HEENT: conjunctivae and lids normal, EOMI ?CV: No cyanosis.   ?RESP: normal respiratory effort, on RA ?Extremities: now have skin folds in legs ?SKIN: warm, dry ?Neuro: II - XII grossly intact.   ?Psych: Normal mood and affect.  Appropriate judgement and reason ? ? ?4/27 ? ? ?4/25 ? ? ? ? ?Data Reviewed: ? ?Family Communication:  ? ?Disposition: ?Status is: changed to inpatient ? ? Planned Discharge Destination: Home ? ? ? ?Time spent: 35 minutes ? ?Author: ?Enzo Bi, MD ?11/04/2021 6:43 PM ? ?For on call review [email or web address].  ?

## 2021-11-04 NOTE — Progress Notes (Signed)
[facility] Cardiology ? ?SUBJECTIVE: Patient laying flat in bed, reports feeling better, less edema, improved breathing ? ? ?Vitals:  ? 11/03/21 2321 11/04/21 0408 11/04/21 0417 11/04/21 0743  ?BP: (!) 105/48 (!) 116/48  114/65  ?Pulse: 63 64  60  ?Resp: '16 16  19  '$ ?Temp: 98.1 ?F (36.7 ?C) 98.5 ?F (36.9 ?C)  98.2 ?F (36.8 ?C)  ?TempSrc: Oral Oral    ?SpO2: 100% 93%  94%  ?Weight:   (!) 169.4 kg   ?Height:      ? ? ? ?Intake/Output Summary (Last 24 hours) at 11/04/2021 0955 ?Last data filed at 11/04/2021 0500 ?Gross per 24 hour  ?Intake 960 ml  ?Output 3900 ml  ?Net -2940 ml  ? ? ? ? ?PHYSICAL EXAM ? ?General: Well developed, well nourished, in no acute distress ?HEENT:  Normocephalic and atramatic ?Neck:  No JVD.  ?Lungs: Clear bilaterally to auscultation and percussion. ?Heart: HRRR . Normal S1 and S2 without gallops or murmurs.  ?Abdomen: Bowel sounds are positive, abdomen soft and non-tender  ?Msk:  Back normal, normal gait. Normal strength and tone for age. ?Extremities: Lymphedema ?Neuro: Alert and oriented X 3. ?Psych:  Good affect, responds appropriately ? ? ?LABS: ?Basic Metabolic Panel: ?Recent Labs  ?  11/03/21 ?0615 11/04/21 ?0423  ?NA 138 137  ?K 3.8 3.7  ?CL 99 96*  ?CO2 32 33*  ?GLUCOSE 98 112*  ?BUN 22 26*  ?CREATININE 0.96 1.07*  ?CALCIUM 8.2* 8.5*  ?MG 2.0 1.8  ? ?Liver Function Tests: ?No results for input(s): AST, ALT, ALKPHOS, BILITOT, PROT, ALBUMIN in the last 72 hours. ?No results for input(s): LIPASE, AMYLASE in the last 72 hours. ?CBC: ?Recent Labs  ?  11/03/21 ?0615 11/04/21 ?0423  ?WBC 5.5 5.8  ?HGB 10.6* 11.2*  ?HCT 34.5* 36.4  ?MCV 85.2 84.3  ?PLT 152 159  ? ?Cardiac Enzymes: ?No results for input(s): CKTOTAL, CKMB, CKMBINDEX, TROPONINI in the last 72 hours. ?BNP: ?Invalid input(s): POCBNP ?D-Dimer: ?No results for input(s): DDIMER in the last 72 hours. ?Hemoglobin A1C: ?No results for input(s): HGBA1C in the last 72 hours. ?Fasting Lipid Panel: ?No results for input(s): CHOL, HDL, LDLCALC, TRIG,  CHOLHDL, LDLDIRECT in the last 72 hours. ?Thyroid Function Tests: ?No results for input(s): TSH, T4TOTAL, T3FREE, THYROIDAB in the last 72 hours. ? ?Invalid input(s): FREET3 ?Anemia Panel: ?No results for input(s): VITAMINB12, FOLATE, FERRITIN, TIBC, IRON, RETICCTPCT in the last 72 hours. ? ?No results found. ? ? ?Echo LVEF 60 to 65% 11/01/2021 ? ?TELEMETRY: Sinus rhythm 60 bpm: ? ?ASSESSMENT AND PLAN: ? ?Principal Problem: ?  Acute on chronic diastolic CHF (congestive heart failure) ([place]) ?Active Problems: ?  Lymphedema ?  OSA (obstructive sleep apnea) ?  Morbid obesity with BMI of 70 and over, adult [facility]) ?  Tobacco use ?  Essential hypertension ?  Chronic kidney disease, stage 3a ([place]) ?  Lymphedema of lower extremity ?  ? ?1.  Acute on chronic diastolic congestive heart failure, HFpEF EF 60 to 65% 11/01/2021, shortness of breath, orthopnea, bilateral lower extremity edema improved after diuresis, net-2940 cc over 24 hours on furosemide 60 mg IV twice daily ?2.  Lymphedema, improving ?3.  Chronic kidney disease 3A, BUN and creatinine 26 and 1.07, GFR 57, remains stable following diuresis ?4.  Essential hypertension, blood pressure well controlled on atenolol and furosemide ?5.  Morbid obesity ? ?Recommendations ? ?1.  Agree with overall current therapy ?2.  Continue diuresis ?3.  Carefully monitor renal status ?4.  Defer further cardiac diagnostics ? ? ?Isaias Cowman, MD, PhD, [facility] ?11/04/2021 ?9:55 AM ? ? ? ?  ?

## 2021-11-04 NOTE — Plan of Care (Signed)
?  Problem: Clinical Measurements: Goal: Respiratory complications will improve Outcome: Progressing Goal: Cardiovascular complication will be avoided Outcome: Progressing   Problem: Elimination: Goal: Will not experience complications related to urinary retention Outcome: Progressing   Problem: Pain Managment: Goal: General experience of comfort will improve Outcome: Progressing   Problem: Safety: Goal: Ability to remain free from injury will improve Outcome: Progressing   Problem: Skin Integrity: Goal: Risk for impaired skin integrity will decrease Outcome: Progressing   

## 2021-11-05 DIAGNOSIS — I5033 Acute on chronic diastolic (congestive) heart failure: Secondary | ICD-10-CM | POA: Diagnosis not present

## 2021-11-05 LAB — BASIC METABOLIC PANEL
Anion gap: 8 (ref 5–15)
BUN: 28 mg/dL — ABNORMAL HIGH (ref 8–23)
CO2: 32 mmol/L (ref 22–32)
Calcium: 8.4 mg/dL — ABNORMAL LOW (ref 8.9–10.3)
Chloride: 98 mmol/L (ref 98–111)
Creatinine, Ser: 1.02 mg/dL — ABNORMAL HIGH (ref 0.44–1.00)
GFR, Estimated: >60 mL/min (ref >60)
Glucose, Bld: 91 mg/dL (ref 70–99)
Potassium: 3.7 mmol/L (ref 3.5–5.1)
Sodium: 138 mmol/L (ref 135–145)

## 2021-11-05 LAB — CBC
HCT: 35.5 % — ABNORMAL LOW (ref 36.0–46.0)
Hemoglobin: 10.9 g/dL — ABNORMAL LOW (ref 12.0–15.0)
MCH: 26.1 pg (ref 26.0–34.0)
MCHC: 30.7 g/dL (ref 30.0–36.0)
MCV: 84.9 fL (ref 80.0–100.0)
Platelets: 142 10*3/uL — ABNORMAL LOW (ref 150–400)
RBC: 4.18 MIL/uL (ref 3.87–5.11)
RDW: 14.7 % (ref 11.5–15.5)
WBC: 5.5 10*3/uL (ref 4.0–10.5)
nRBC: 0 % (ref 0.0–0.2)

## 2021-11-05 LAB — MAGNESIUM: Magnesium: 1.9 mg/dL (ref 1.7–2.4)

## 2021-11-05 NOTE — Progress Notes (Signed)
?  Progress Note ? ? ?Patient: [name] YTK:[id number] DOB: [birth year] DOA: 10/31/2021     3 ?DOS: the patient was seen and examined on 11/05/2021 ?  ?Brief hospital course: ?No notes on file ? ?Assessment and Plan: ?* Acute on chronic diastolic CHF (congestive heart failure) ([place]) ?Patient presents for evaluation of worsening shortness of breath with exertion associated with orthopnea and worsening bilateral lower extremity swelling ?Last 2D echocardiogram from 01/23 shows an LVEF of greater than 55% ?--cardio consulted ?Plan: ?--cont IV lasix 60 mg BID ?--Continue low-sodium diet with fluid restriction ? ? ?Essential hypertension ?--cont atenolol at reduced 25 mg daily ? ?Chronic kidney disease, stage 3a ([place]) ?Most likely related to known history of hypertension ?Stable ? ?Lymphedema ?Chronic.  Standing weight in clinic PTA was 391 lbs. ?--IV diuresis for fluid removal ? ?Morbid obesity with BMI of 70 and over, adult [facility]) ?Complicates overall prognosis and care ?Lifestyle modification and exercise has been discussed with patient in detail ? ?OSA (obstructive sleep apnea) ?Most likely related to morbid obesity ?CPAP at bedtime ? ?Tobacco use ?Patient dips tobacco ?She has been advised to abstain from further use ? ? ? ? ?  ? ?Subjective:  ?Not as much urine output for the past day with reduced doses of IV lasix. ? ? ?Physical Exam: ? ?Constitutional: NAD, AAOx3 ?HEENT: conjunctivae and lids normal, EOMI ?CV: No cyanosis.   ?RESP: normal respiratory effort, on RA ?Extremities: severe edema in BLE with skin changes ?SKIN: warm, dry ?Neuro: II - XII grossly intact.   ?Psych: Normal mood and affect.  Appropriate judgement and reason ? ? ? ?4/27 ? ? ?4/25 ? ? ? ? ?Data Reviewed: ? ?Family Communication:  ? ?Disposition: ?Status is: inpatient ? ? Planned Discharge Destination: Home ? ? ? ?Time spent: 35 minutes ? ?Author: ?Enzo Bi, MD ?11/05/2021 7:24 PM ? ?For on call review [email or web address]  ?

## 2021-11-05 NOTE — Progress Notes (Signed)
[facility] Cardiology ? ?SUBJECTIVE: Patient laying flat in bed, reports doing well overall, denies chest pain ? ? ?Vitals:  ? 11/04/21 1830 11/04/21 2334 11/05/21 6378 11/05/21 0752  ?BP: 114/61 131/62 119/74 (!) 111/52  ?Pulse: 63 62 70 64  ?Resp: '19 19  19  '$ ?Temp: 97.7 ?F (36.5 ?C) 98 ?F (36.7 ?C) 98.3 ?F (36.8 ?C) 98.4 ?F (36.9 ?C)  ?TempSrc:      ?SpO2: 99% 98% 93% 96%  ?Weight:   (!) 166.2 kg   ?Height:      ? ? ? ?Intake/Output Summary (Last 24 hours) at 11/05/2021 0950 ?Last data filed at 11/05/2021 5885 ?Gross per 24 hour  ?Intake 720 ml  ?Output 2300 ml  ?Net -1580 ml  ? ? ? ? ?PHYSICAL EXAM ? ?General: Well developed, well nourished, in no acute distress ?HEENT:  Normocephalic and atramatic ?Neck:  No JVD.  ?Lungs: Clear bilaterally to auscultation and percussion. ?Heart: HRRR . Normal S1 and S2 without gallops or murmurs.  ?Abdomen: Bowel sounds are positive, abdomen soft and non-tender  ?Msk:  Back normal, normal gait. Normal strength and tone for age. ?Extremities: Lymphedema ?Neuro: Alert and oriented X 3. ?Psych:  Good affect, responds appropriately ? ? ?LABS: ?Basic Metabolic Panel: ?Recent Labs  ?  11/04/21 ?0423 11/05/21 ?0412  ?NA 137 138  ?K 3.7 3.7  ?CL 96* 98  ?CO2 33* 32  ?GLUCOSE 112* 91  ?BUN 26* 28*  ?CREATININE 1.07* 1.02*  ?CALCIUM 8.5* 8.4*  ?MG 1.8 1.9  ? ?Liver Function Tests: ?No results for input(s): AST, ALT, ALKPHOS, BILITOT, PROT, ALBUMIN in the last 72 hours. ?No results for input(s): LIPASE, AMYLASE in the last 72 hours. ?CBC: ?Recent Labs  ?  11/04/21 ?0423 11/05/21 ?0412  ?WBC 5.8 5.5  ?HGB 11.2* 10.9*  ?HCT 36.4 35.5*  ?MCV 84.3 84.9  ?PLT 159 142*  ? ?Cardiac Enzymes: ?No results for input(s): CKTOTAL, CKMB, CKMBINDEX, TROPONINI in the last 72 hours. ?BNP: ?Invalid input(s): POCBNP ?D-Dimer: ?No results for input(s): DDIMER in the last 72 hours. ?Hemoglobin A1C: ?No results for input(s): HGBA1C in the last 72 hours. ?Fasting Lipid Panel: ?No results for input(s): CHOL, HDL, LDLCALC,  TRIG, CHOLHDL, LDLDIRECT in the last 72 hours. ?Thyroid Function Tests: ?No results for input(s): TSH, T4TOTAL, T3FREE, THYROIDAB in the last 72 hours. ? ?Invalid input(s): FREET3 ?Anemia Panel: ?No results for input(s): VITAMINB12, FOLATE, FERRITIN, TIBC, IRON, RETICCTPCT in the last 72 hours. ? ?No results found. ? ? ?Echo LVEF 60-65% 11/01/2021 ? ?TELEMETRY: Sinus rhythm 64 bpm: ? ?ASSESSMENT AND PLAN: ? ?Principal Problem: ?  Acute on chronic diastolic CHF (congestive heart failure) ([place]) ?Active Problems: ?  Lymphedema ?  OSA (obstructive sleep apnea) ?  Morbid obesity with BMI of 70 and over, adult [facility]) ?  Tobacco use ?  Essential hypertension ?  Chronic kidney disease, stage 3a ([place]) ?  Lymphedema of lower extremity ?  ? ?1. Acute on chronic diastolic congestive heart failure, HFpEF EF 60 to 65% 11/01/2021, shortness of breath, orthopnea, bilateral lower extremity edema improved after diuresis, net-1340 cc over 24 hours resumed on furosemide 60 mg IV twice daily ?2.  Lymphedema, improving ?3.  Chronic kidney disease 3A, BUN and creatinine 28 and 1.02, GFR > 60, remains stable following diuresis ?4.  Essential hypertension, blood pressure well controlled on atenolol and furosemide ?5.  Morbid obesity ?  ?Recommendations ?  ?1.  Agree with overall current therapy ?2.  Continue diuresis ?3.  Carefully monitor renal  status ?4.  Defer further cardiac diagnostics ? ? ?Elizabeth Cowman, MD, PhD, [facility] ?11/05/2021 ?9:50 AM ? ? ? ?  ?

## 2021-11-06 DIAGNOSIS — I5033 Acute on chronic diastolic (congestive) heart failure: Secondary | ICD-10-CM | POA: Diagnosis not present

## 2021-11-06 LAB — MAGNESIUM: Magnesium: 2.1 mg/dL (ref 1.7–2.4)

## 2021-11-06 LAB — CBC
HCT: 35.7 % — ABNORMAL LOW (ref 36.0–46.0)
Hemoglobin: 10.9 g/dL — ABNORMAL LOW (ref 12.0–15.0)
MCH: 26.5 pg (ref 26.0–34.0)
MCHC: 30.5 g/dL (ref 30.0–36.0)
MCV: 86.9 fL (ref 80.0–100.0)
Platelets: 155 10*3/uL (ref 150–400)
RBC: 4.11 MIL/uL (ref 3.87–5.11)
RDW: 14.8 % (ref 11.5–15.5)
WBC: 6.5 10*3/uL (ref 4.0–10.5)
nRBC: 0 % (ref 0.0–0.2)

## 2021-11-06 LAB — BASIC METABOLIC PANEL
Anion gap: 8 (ref 5–15)
BUN: 32 mg/dL — ABNORMAL HIGH (ref 8–23)
CO2: 33 mmol/L — ABNORMAL HIGH (ref 22–32)
Calcium: 8.2 mg/dL — ABNORMAL LOW (ref 8.9–10.3)
Chloride: 100 mmol/L (ref 98–111)
Creatinine, Ser: 1.26 mg/dL — ABNORMAL HIGH (ref 0.44–1.00)
GFR, Estimated: 47 mL/min — ABNORMAL LOW (ref >60)
Glucose, Bld: 114 mg/dL — ABNORMAL HIGH (ref 70–99)
Potassium: 4.1 mmol/L (ref 3.5–5.1)
Sodium: 141 mmol/L (ref 135–145)

## 2021-11-06 MED ORDER — METOLAZONE 2.5 MG PO TABS: 2.5000 mg | ORAL_TABLET | ORAL | Status: DC

## 2021-11-06 MED ORDER — METOLAZONE 2.5 MG PO TABS: ORAL_TABLET | ORAL | 2 refills | Status: DC

## 2021-11-06 MED ORDER — ATENOLOL 25 MG PO TABS
25.0000 mg | ORAL_TABLET | Freq: Every day | ORAL | 2 refills | Status: DC
Start: 2021-11-06 — End: 2022-04-07

## 2021-11-06 MED ORDER — FUROSEMIDE 40 MG PO TABS
40.0000 mg | ORAL_TABLET | Freq: Every day | ORAL | 2 refills | Status: DC
Start: 2021-11-06 — End: 2021-11-23

## 2021-11-06 MED ORDER — FUROSEMIDE 40 MG PO TABS
40.0000 mg | ORAL_TABLET | Freq: Every day | ORAL | Status: DC
  Administered 2021-11-06: 40 mg via ORAL
  Filled 2021-11-06: qty 1

## 2021-11-06 NOTE — Discharge Summary (Signed)
? ?Physician Discharge Summary ? ? ?[name]  female DOB: 10-10-1955  ?XTG:[id number] ? ?PCP: Ranae Plumber, [place] ? ?Admit date: 10/31/2021 ?Discharge date: 11/06/2021 ? ?Admitted From: home ?Disposition:  home ?CODE STATUS: Full code ? ?Discharge Instructions   ? ? Discharge wound care:   Complete by: As directed ?  ? 1. Change dressing to left leg every day as follows:  Leave Mepitel Kellie Simmering # [phone]) in place over wound, change every Wed.  Apply piece of Aquacel over Mepitel, then cover wound and skin folds with abd pads.  Cover with kerlex and ace wrap.  Moisten Aquacel with saline to assist with removal each time. ? ?2. Change dressing to right leg every M/W/F as follows: Cover skin folds with abd pads.  Cover with kerlex and ace wrap ?- ?-  ? ?  ? ?Hospital Course:  ?For full details, please see H&P, progress notes, consult notes and ancillary notes.  ?Briefly,  ?[name] is a 66 y.o. female with medical history significant for hypertension, asthma, obstructive sleep apnea on CPAP, chronic bilateral lymphedema, morbid obesity (BMI 72.94 kg/m2), dips tobacco, stage III A chronic kidney disease who presented to the emergency room from the wound care clinic for evaluation of worsening shortness of breath. ? 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  She notes that she has worsening shortness of breath with exertion associated with typical orthopnea and worsening bilateral lower extremity swelling.  She admits to some dietary indiscretion and states that her diuretics were changed from lasix to torsemide during her last hospitalization, discharged on 10/08/21.  According to the patient there has been a decrease in her urine output with torsemide.  She admits to significant weight gain over 20 pounds. ? ?Chronic Lymphedema ?Fluid accumulated over the years in her BLE up to her groins.  Attempt had been made after  April 2021 hospitalization to set pt up with lymphedema clinic with [facility], however, pt said she showed up to her appointment only to be told she was too large to fit on the treatment table.  Pt has been using diuretic to manage her lymphedema. ?--Standing weight in clinic PTA was 391 lbs. ?--Pt received IV lasix 60 mg TID with great urine output.   ?--Standing weight prior to discharge was 361 lbs. ?--pt was discharged on oral lasix 40 mg daily with metolazone on Tue and Sat prior to lasix.  Pt was also instructed on fluid restriction and low-salt diet. ? ?Photo from 5/1 day of discharge ? ? ? ?Photo from 4/25, day of admission ? ? ? ?* Acute on chronic diastolic CHF (congestive heart failure) ([place]) ?Last 2D echocardiogram from 01/23 shows an LVEF of greater than 55% ?--cardio consulted ?--diuretics as above. ?--continue atenolol at '25mg'$  daily ('50mg'$  home dose) for GDMT and to prevent rebound tachycardia with abrupt discontinuation.  Defer ACE inhibitor/ARB/Entresto due to history of angioedema with enalapril.  ?--follow up with Dr. Saralyn Pilar in 1-2 weeks.  ? ?Essential hypertension ?--cont atenolol at reduced 25 mg daily ?  ?Chronic kidney disease, stage 3a ([place]) ?Most likely related to known history of hypertension ?Stable ?  ?Morbid obesity with BMI of 70 and over, adult [facility]) ?Complicates overall prognosis and care ?Lifestyle modification and exercise has been discussed with patient in detail ?  ?OSA (obstructive sleep apnea) ?Most likely related to morbid obesity ?CPAP at bedtime ?  ?Tobacco use ?Patient dips  tobacco ?She has been advised to abstain from further use ? ? ?Discharge Diagnoses:  ?Principal Problem: ?  Acute on chronic diastolic CHF (congestive heart failure) ([place]) ?Active Problems: ?  Essential hypertension ?  Chronic kidney disease, stage 3a ([place]) ?  Lymphedema ?  Morbid obesity with BMI of 70 and over, adult [facility]) ?  OSA (obstructive sleep apnea) ?  Tobacco use ?  Lymphedema of lower  extremity ? ? ?30 Day Unplanned Readmission Risk Score   ? ?Flowsheet Row ED to Hosp-Admission (Current) from 10/31/2021 in [facility]  ?30 Day Unplanned Readmission Risk Score (%) 17.12 Filed at 11/06/2021 0801  ? ?  ? ? This score is the patient's risk of an unplanned readmission within 30 days of being discharged (0 -100%). The score is based on dignosis, age, lab data, medications, orders, and past utilization.   ?Low:  0-14.9   Medium: 15-21.9   High: 22-29.9   Extreme: 30 and above ? ?  ? ?  ? ? ?Discharge Instructions: ? ?Allergies as of 11/06/2021   ? ?   Reactions  ? Ace Inhibitors Hives, Swelling  ? Ibuprofen Hives, Swelling  ? Vancomycin Itching, Nausea And Vomiting  ? ?  ? ?  ?Medication List  ?  ? ?STOP taking these medications   ? ?hydrOXYzine 10 MG tablet ?Commonly known as: ATARAX ?  ?methocarbamol 500 MG tablet ?Commonly known as: ROBAXIN ?  ?torsemide 20 MG tablet ?Commonly known as: DEMADEX ?  ? ?  ? ?TAKE these medications   ? ?acetaminophen 500 MG tablet ?Commonly known as: TYLENOL ?Take 500-1,000 mg by mouth every 6 (six) hours as needed for mild pain or fever. ?  ?atenolol 25 MG tablet ?Commonly known as: TENORMIN ?Take 1 tablet (25 mg total) by mouth daily. ?What changed:  ?medication strength ?how much to take ?  ?cetirizine 10 MG tablet ?Commonly known as: ZYRTEC ?Take 1 tablet (10 mg total) by mouth daily. ?  ?furosemide 40 MG tablet ?Commonly known as: LASIX ?Take 1 tablet (40 mg total) by mouth daily. ?What changed: medication strength ?  ?metolazone 2.5 MG tablet ?Commonly known as: ZAROXOLYN ?Take 1 tab every Tue and Sat 30 minutes prior Lasix. ?  ?pregabalin 50 MG capsule ?Commonly known as: LYRICA ?Take 1 capsule (50 mg total) by mouth 2 (two) times daily. ?  ? ?  ? ?  ?  ? ? ?  ?Discharge Care Instructions  ?(From admission, onward)  ?  ? ? ?  ? ?  Start     Ordered  ? 11/06/21 0000  Discharge wound care:       ?Comments: 1. Change dressing to left leg every day  as follows:  Leave Mepitel Kellie Simmering # [phone]) in place over wound, change every Wed.  Apply piece of Aquacel over Mepitel, then cover wound and skin folds with abd pads.  Cover with kerlex and ace wrap.  Moisten Aquacel with saline to assist with removal each time. ? ?2. Change dressing to right leg every M/W/F as follows: Cover skin folds with abd pads.  Cover with kerlex and ace wrap ?- ?-  ? 11/06/21 0850  ? ?  ?  ? ?  ? ? ? Follow-up Information   ? ? Paraschos, Alexander, MD. Go in 1 week(s).   ?Specialty: Cardiology ?Contact information: ?[facility] ?[place] [address] ?[phone] ? ? ?  ?  ? ?  ?  ? ?  ? ? ?  Allergies  ?Allergen Reactions  ? Ace Inhibitors Hives and Swelling  ? Ibuprofen Hives and Swelling  ? Vancomycin Itching and Nausea And Vomiting  ? ? ? ?The results of significant diagnostics from this hospitalization (including imaging, microbiology, ancillary and laboratory) are listed below for reference.  ? ?Consultations: ? ? ?Procedures/Studies: ?DG Chest Port 1 View ? ?Result Date: 10/31/2021 ?CLINICAL DATA:  Short of breath EXAM: PORTABLE CHEST 1 VIEW COMPARISON:  01/19/2021 FINDINGS: Stable enlarged cardiac silhouette. There is central venous congestion similar prior. No effusion, infiltrate pneumothorax. No acute osseous abnormality. IMPRESSION: Cardiomegaly and central venous congestion similar to comparison exam. Electronically Signed   By: Suzy Bouchard M.D.   On: 10/31/2021 13:19  ? ?ECHOCARDIOGRAM COMPLETE ? ?Result Date: 11/01/2021 ?   ECHOCARDIOGRAM REPORT   Patient Name:   CAYMAN BROGDEN Date of Exam: 11/01/2021 Medical Rec #:  [id number]           Height:       63.5 in Accession #:    [id number]          Weight:       419.3 lb Date of Birth:  07/16/1955           BSA:          2.664 m? Patient Age:    40 years            BP:           121/70 mmHg Patient Gender: F                   HR:           68 bpm. Exam Location:  [facility] Procedure: 2D  Echo, Cardiac Doppler and Color Doppler Indications:     CHF-acute diastolic K34.91  History:         Patient has prior history of Echocardiogram examinations, most                  recent 04/13/2019. Risk Factors:H

## 2021-11-06 NOTE — Progress Notes (Signed)
Patient discharged, awaiting medical transportation pick-up. ?

## 2021-11-06 NOTE — Progress Notes (Signed)
?[facility] CONSULT NOTE  ? ?    ?Patient ID: ?[name] ?MRN: [id number] ?DOB/AGE: [birth year] 66 y.o. ? ?Admit date: 10/31/2021 ?Referring Physician Dr. Francine Graven ?Primary Physician [facility] ?Primary Cardiologist Dr. Saralyn Pilar / Clabe Seal, PA-C ?Reason for Consultation acute on chronic HFpEF ? ?HPI: [name] is a 66yoF with a past medical history of chronic lymphedema, HFpEF (LVEF 60-35%, trivial MR 10/2021), hypertension, OSA on CPAP, morbid obesity, CKD 3 presented to [facility] ED from wound care clinic with worsening SOB. Cardiology is consulted for assistance with diuresis.  ? ?Interval History: ?-continues with excellent diuresis, net negative 21L since admission 6 days ago ?-feels like her leg swelling is at her baseline. Feels little fatigued today but overall much improved. No chest pain, shortness of breath.  ?-has not been up out of bed yet today.  ? ? ?Review of systems complete and found to be negative unless listed above  ? ? ?Past Medical History:  ?Diagnosis Date  ? Arthritis   ? Asthma   ? Enlarged heart   ? Hypertension   ? Lymphedema   ? Sleep apnea   ? Spinal stenosis   ?  ?Past Surgical History:  ?Procedure Laterality Date  ? COLONOSCOPY WITH PROPOFOL N/A 03/12/2018  ? Procedure: COLONOSCOPY WITH PROPOFOL;  Surgeon: Manya Silvas, MD;  Location: [facility] ENDOSCOPY;  Service: Endoscopy;  Laterality: N/A;  ? NO PAST SURGERIES    ?  ?Medications Prior to Admission  ?Medication Sig Dispense Refill Last Dose  ? acetaminophen (TYLENOL) 500 MG tablet Take 500-1,000 mg by mouth every 6 (six) hours as needed for mild pain or fever.   10/30/2021  ? atenolol (TENORMIN) 50 MG tablet Take 50 mg by mouth daily.   10/30/2021  ? cetirizine (ZYRTEC) 10 MG tablet Take 1 tablet (10 mg total) by mouth daily. 60 tablet 0 10/30/2021  ? furosemide (LASIX) 20 MG tablet Take 40 mg by mouth daily.   10/30/2021  ? pregabalin (LYRICA) 50 MG capsule Take 1 capsule (50 mg total) by mouth 2 (two) times  daily. 60 capsule 0 Past Month  ? hydrOXYzine (ATARAX) 10 MG tablet Take 1 tablet (10 mg total) by mouth 3 (three) times daily as needed for itching. (Patient not taking: Reported on 10/31/2021) 30 tablet 0 Not Taking  ? methocarbamol (ROBAXIN) 500 MG tablet Take 1 tablet (500 mg total) by mouth every 8 (eight) hours as needed for muscle spasms. (Patient not taking: Reported on 10/31/2021) 15 tablet 0 Not Taking  ? torsemide (DEMADEX) 20 MG tablet Take 1 tablet (20 mg total) by mouth daily. (Patient not taking: Reported on 10/31/2021) 30 tablet 0 Not Taking  ? ? ?Social History  ? ?Socioeconomic History  ? Marital status: Single  ?  Spouse name: Not on file  ? Number of children: Not on file  ? Years of education: Not on file  ? Highest education level: Not on file  ?Occupational History  ? Not on file  ?Tobacco Use  ? Smoking status: Former  ?  Packs/day: 0.25  ?  Types: Cigarettes  ?  Start date: 07/09/1976  ?  Quit date: 07/09/1990  ?  Years since quitting: 31.3  ? Smokeless tobacco: Current  ?  Types: Snuff  ?Vaping Use  ? Vaping Use: Never used  ?Substance and Sexual Activity  ? Alcohol use: No  ? Drug use: No  ? Sexual activity: Not on file  ?Other Topics Concern  ? Not on file  ?  Social History Narrative  ? Not on file  ? ?Social Determinants of Health  ? ?Financial Resource Strain: Not on file  ?Food Insecurity: Not on file  ?Transportation Needs: Not on file  ?Physical Activity: Not on file  ?Stress: Not on file  ?Social Connections: Not on file  ?Intimate Partner Violence: Not on file  ?  ?Family History  ?Problem Relation Age of Onset  ? Thyroid disease Other   ? Breast cancer Maternal Aunt   ?  ? ?PHYSICAL EXAM ?General: Pleasant black female, well nourished, in no acute distress.  Sitting in PCU bed ?HEENT:  Normocephalic and atraumatic. ?Neck:  No JVD.  ?Lungs: Normal respiratory effort on room air.  Decreased breath sounds without appreciable crackles or wheezes ?Heart:  brady RRR . Normal S1 and S2 without  gallops or murmurs.  ?Abdomen: Obese appearing.  ?Msk: Normal strength and tone for age. ?Extremities: Warm and well perfused.  Extreme bilateral lower extremity lymphedema much improving with softer skin  ?Neuro: Alert and oriented X 3. ?Psych:  Answers questions appropriately.  ? ?Labs: ?  ?Lab Results  ?Component Value Date  ? WBC 6.5 11/06/2021  ? HGB 10.9 (L) 11/06/2021  ? HCT 35.7 (L) 11/06/2021  ? MCV 86.9 11/06/2021  ? PLT 155 11/06/2021  ?  ?Recent Labs  ?Lab 10/31/21 ?1248 11/01/21 ?3235 11/06/21 ?5732  ?NA 140   < > 141  ?K 3.9   < > 4.1  ?CL 104   < > 100  ?CO2 25   < > 33*  ?BUN 21   < > 32*  ?CREATININE 1.04*   < > 1.26*  ?CALCIUM 9.1   < > 8.2*  ?PROT 8.9*  --   --   ?BILITOT 0.6  --   --   ?ALKPHOS 135*  --   --   ?ALT 18  --   --   ?AST 24  --   --   ?GLUCOSE 81   < > 114*  ? < > = values in this interval not displayed.  ? ? ?Lab Results  ?Component Value Date  ? TROPONINI <0.03 10/25/2018  ? ? No results found for: CHOL ?No results found for: HDL ?No results found for: [place] ?No results found for: TRIG ?No results found for: CHOLHDL ?No results found for: LDLDIRECT  ?  ?Radiology: [facility] Chest Port 1 View ? ?Result Date: 10/31/2021 ?CLINICAL DATA:  Short of breath EXAM: PORTABLE CHEST 1 VIEW COMPARISON:  01/19/2021 FINDINGS: Stable enlarged cardiac silhouette. There is central venous congestion similar prior. No effusion, infiltrate pneumothorax. No acute osseous abnormality. IMPRESSION: Cardiomegaly and central venous congestion similar to comparison exam. Electronically Signed   By: Suzy Bouchard M.D.   On: 10/31/2021 13:19  ? ?ECHOCARDIOGRAM COMPLETE ? ?Result Date: 11/01/2021 ?   ECHOCARDIOGRAM REPORT   Patient Name:   [name] Date of Exam: 11/01/2021 Medical Rec #:  [id number]           Height:       63.5 in Accession #:    [id number]          Weight:       419.3 lb Date of Birth:  Apr 21, 1956           BSA:          2.664 m? Patient Age:    66 years            BP:  121/70 mmHg  Patient Gender: F                   HR:           68 bpm. Exam Location:  [facility] Procedure: 2D Echo, Cardiac Doppler and Color Doppler Indications:     CHF-acute diastolic C16.60  History:         Patient has prior history of Echocardiogram examinations, most                  recent 04/13/2019. Risk Factors:Hypertension and Sleep Apnea.                  Enlarged heart.  Sonographer:     Sherrie Sport Referring Phys:  [id number] FUXNATFT AGBATA Diagnosing Phys: Isaias Cowman MD  Sonographer Comments: Suboptimal parasternal window. IMPRESSIONS  1. Left ventricular ejection fraction, by estimation, is 60 to 65%. The left ventricle has normal function. The left ventricle has no regional wall motion abnormalities. Left ventricular diastolic parameters were normal.  2. Right ventricular systolic function is normal. The right ventricular size is normal.  3. The mitral valve is normal in structure. Trivial mitral valve regurgitation. No evidence of mitral stenosis.  4. The aortic valve is normal in structure. Aortic valve regurgitation is not visualized. No aortic stenosis is present.  5. The inferior vena cava is normal in size with greater than 50% respiratory variability, suggesting right atrial pressure of 3 mmHg. FINDINGS  Left Ventricle: Left ventricular ejection fraction, by estimation, is 60 to 65%. The left ventricle has normal function. The left ventricle has no regional wall motion abnormalities. The left ventricular internal cavity size was normal in size. There is  no left ventricular hypertrophy. Left ventricular diastolic parameters were normal. Right Ventricle: The right ventricular size is normal. No increase in right ventricular wall thickness. Right ventricular systolic function is normal. Left Atrium: Left atrial size was normal in size. Right Atrium: Right atrial size was normal in size. Pericardium: There is no evidence of pericardial effusion. Mitral Valve: The mitral valve is normal in structure. Trivial  mitral valve regurgitation. No evidence of mitral valve stenosis. MV peak gradient, 8.4 mmHg. The mean mitral valve gradient is 3.0 mmHg. Tricuspid Valve: The tricuspid valve is normal in structure. Tric

## 2021-11-06 NOTE — Care Management Important Message (Signed)
Important Message ? ?Patient Details  ?Name: [name] ?MRN: [id number] ?Date of Birth: Mar 16, 1956 ? ? ?Medicare Important Message Given:  Yes ? ? ? ? ?[name] ?11/06/2021, 11:40 AM ?

## 2021-11-06 NOTE — Progress Notes (Signed)
? ?  Heart Failure Nurse Navigator Note ? ?Met with patient today, she was sitting up in the chair at bedside.  States that she is going to be discharged home today.  She feels that she is close to baseline. ? ?She states that she is being discharged home on metolazone to take 2 days of the week.  She was able to me that she is to  take it 30 minutes before her Lasix. ? ?Reinforced daily weights and what to report.  She states that when she gets home she is going to order the second scale from her insurance company.  Then she will have to scales to stand on, 1 for each foot. ? ?She states that she knows she has to get rid of the saltshaker altogether. ? ?Discussed following in the lymphedema clinic, she had tried to go to the clinic at [facility] but states the appointments were late in the day and did not work for her. ? ?Pricilla Riffle RN [facility] ?

## 2021-11-06 NOTE — TOC Transition Note (Signed)
Transition of Care (TOC) - CM/SW Discharge Note ? ? ?Patient Details  ?Name: [name] ?MRN: [id number] ?Date of Birth: 04-Oct-1955 ? ?Transition of Care (TOC) CM/SW Contact:  ?Laurena Slimmer, RN ?Phone Number: ?11/06/2021, 3:43 PM ? ? ?Clinical Narrative:    ?Patient will return home today. EMS packet on chart. ES transport set up. TOC signing off.  ? ? ?  ?  ? ? ?Patient Goals and CMS Choice ?  ?  ?  ? ?Discharge Placement ?  ?           ?  ?  ?  ?  ? ?Discharge Plan and Services ?  ?  ?           ?  ?  ?  ?  ?  ?  ?  ?  ?  ?  ? ?Social Determinants of Health (SDOH) Interventions ?  ? ? ?Readmission Risk Interventions ? ?  01/20/2021  ?  9:48 AM  ?Readmission Risk Prevention Plan  ?Transportation Screening Complete  ?PCP or Specialist Appt within 5-7 Days Complete  ?Home Care Screening Complete  ?Medication Review (RN CM) Referral to Pharmacy  ? ? ? ? ? ?

## 2021-11-07 ENCOUNTER — Encounter: Payer: Medicare HMO | Admitting: Physician Assistant

## 2021-11-14 ENCOUNTER — Encounter: Payer: Medicare HMO | Attending: Physician Assistant | Admitting: Physician Assistant

## 2021-11-14 ENCOUNTER — Ambulatory Visit: Payer: Medicare HMO | Admitting: Family

## 2021-11-14 DIAGNOSIS — I509 Heart failure, unspecified: Secondary | ICD-10-CM | POA: Diagnosis not present

## 2021-11-14 DIAGNOSIS — Z6841 Body Mass Index (BMI) 40.0 and over, adult: Secondary | ICD-10-CM | POA: Diagnosis not present

## 2021-11-14 DIAGNOSIS — E11621 Type 2 diabetes mellitus with foot ulcer: Secondary | ICD-10-CM | POA: Insufficient documentation

## 2021-11-14 DIAGNOSIS — I110 Hypertensive heart disease with heart failure: Secondary | ICD-10-CM | POA: Diagnosis not present

## 2021-11-14 DIAGNOSIS — I890 Lymphedema, not elsewhere classified: Secondary | ICD-10-CM | POA: Diagnosis not present

## 2021-11-14 DIAGNOSIS — L97811 Non-pressure chronic ulcer of other part of right lower leg limited to breakdown of skin: Secondary | ICD-10-CM | POA: Insufficient documentation

## 2021-11-14 DIAGNOSIS — G4733 Obstructive sleep apnea (adult) (pediatric): Secondary | ICD-10-CM | POA: Diagnosis not present

## 2021-11-14 DIAGNOSIS — L97821 Non-pressure chronic ulcer of other part of left lower leg limited to breakdown of skin: Secondary | ICD-10-CM | POA: Insufficient documentation

## 2021-11-14 DIAGNOSIS — E669 Obesity, unspecified: Secondary | ICD-10-CM | POA: Diagnosis not present

## 2021-11-14 NOTE — Progress Notes (Addendum)
ITZAMAR, TRAYNOR ([id number]) ?Visit Report for 11/14/2021 ?Arrival Information Details ?Patient Name: [name], [name] ?Date of Service: 11/14/2021 10:30 AM ?Medical Record Number: [id number] ?Patient Account Number: [id number] ?Date of Birth/Sex: [birth year] (66 y.o. F) ?Treating RN: [name] Dredge ?Primary Care Teagyn Fishel: Ranae Plumber Other Clinician: ?Referring Marialuiza Car: Ranae Plumber ?Treating Shonda Mandarino/Extender: Jeri Cos ?Weeks in Treatment: 15 ?Visit Information History Since Last Visit ?Added or deleted any medications: No ?Patient Arrived: [name] ?Any new allergies or adverse reactions: No ?Arrival Time: 10:53 ?Had a fall or experienced change in No ?Accompanied By: self ?activities of daily living that may affect ?Transfer Assistance: None ?risk of falls: ?Patient Identification Verified: Yes ?Hospitalized since last visit: Yes ?Secondary Verification Process Completed: Yes ?Has Dressing in Place as Prescribed: Yes ?Pain Present Now: No ?Electronic Signature(s) ?Signed: 11/14/2021 4:57:35 PM By: [name] Dredge ?Entered By: [name] Dredge on 11/14/2021 10:54:15 ?JACQUELENE, KOPECKY ([id number]) ?-------------------------------------------------------------------------------- ?Clinic Level of Care Assessment Details ?Patient Name: [name], [name] ?Date of Service: 11/14/2021 10:30 AM ?Medical Record Number: [id number] ?Patient Account Number: [id number] ?Date of Birth/Sex: 06/15/1956 (66 y.o. F) ?Treating RN: [name] Dredge ?Primary Care Thyra [name]: Ranae Plumber Other Clinician: ?Referring Laureano Hetzer: Ranae Plumber ?Treating Thanya Cegielski/Extender: Jeri Cos ?Weeks in Treatment: 15 ?Clinic Level of Care Assessment Items ?TOOL 4 Quantity Score ?'[]'$  - Use when only an EandM is performed on FOLLOW-UP visit 0 ?ASSESSMENTS - Nursing Assessment / Reassessment ?X - Reassessment of Co-morbidities (includes updates in patient status) 1 10 ?X- 1 5 ?Reassessment of Adherence to Treatment Plan ?ASSESSMENTS - Wound and Skin  Assessment / Reassessment ?X - Simple Wound Assessment / Reassessment - one wound 1 5 ?'[]'$  - 0 ?Complex Wound Assessment / Reassessment - multiple wounds ?'[]'$  - 0 ?Dermatologic / Skin Assessment (not related to wound area) ?ASSESSMENTS - Focused Assessment ?X - Circumferential Edema Measurements - multi extremities 1 5 ?'[]'$  - 0 ?Nutritional Assessment / Counseling / Intervention ?'[]'$  - 0 ?Lower Extremity Assessment (monofilament, tuning fork, pulses) ?'[]'$  - 0 ?Peripheral Arterial Disease Assessment (using hand held doppler) ?ASSESSMENTS - Ostomy and/or Continence Assessment and Care ?'[]'$  - Incontinence Assessment and Management 0 ?'[]'$  - 0 ?Ostomy Care Assessment and Management (repouching, etc.) ?PROCESS - Coordination of Care ?X - Simple Patient / Family Education for ongoing care 1 15 ?'[]'$  - 0 ?Complex (extensive) Patient / Family Education for ongoing care ?'[]'$  - 0 ?Staff obtains Consents, Records, Test Results / Process Orders ?'[]'$  - 0 ?Staff telephones HHA, Nursing Homes / Clarify orders / etc ?'[]'$  - 0 ?Routine Transfer to another Facility (non-emergent condition) ?'[]'$  - 0 ?Routine Hospital Admission (non-emergent condition) ?'[]'$  - 0 ?New Admissions / Biomedical engineer / Ordering NPWT, Apligraf, etc. ?'[]'$  - 0 ?Emergency Hospital Admission (emergent condition) ?X- 1 10 ?Simple Discharge Coordination ?'[]'$  - 0 ?Complex (extensive) Discharge Coordination ?PROCESS - Special Needs ?'[]'$  - Pediatric / Minor Patient Management 0 ?'[]'$  - 0 ?Isolation Patient Management ?'[]'$  - 0 ?Hearing / Language / Visual special needs ?'[]'$  - 0 ?Assessment of Community assistance (transportation, D/C planning, etc.) ?'[]'$  - 0 ?Additional assistance / Altered mentation ?'[]'$  - 0 ?Support Surface(s) Assessment (bed, cushion, seat, etc.) ?INTERVENTIONS - Wound Cleansing / Measurement ?[name], [name] ([id number]) ?X- 1 5 ?Simple Wound Cleansing - one wound ?'[]'$  - 0 ?Complex Wound Cleansing - multiple wounds ?X- 1 5 ?Wound Imaging (photographs - any number  of wounds) ?'[]'$  - 0 ?Wound Tracing (instead of photographs) ?X- 1 5 ?Simple Wound Measurement - one wound ?'[]'$  - 0 ?  Complex Wound Measurement - multiple wounds ?INTERVENTIONS - Wound Dressings ?X - Small Wound Dressing one or multiple wounds 1 10 ?'[]'$  - 0 ?Medium Wound Dressing one or multiple wounds ?'[]'$  - 0 ?Large Wound Dressing one or multiple wounds ?'[]'$  - 0 ?Application of Medications - topical ?'[]'$  - 0 ?Application of Medications - injection ?INTERVENTIONS - Miscellaneous ?'[]'$  - External ear exam 0 ?'[]'$  - 0 ?Specimen Collection (cultures, biopsies, blood, body fluids, etc.) ?'[]'$  - 0 ?Specimen(s) / Culture(s) sent or taken to Lab for analysis ?'[]'$  - 0 ?Patient Transfer (multiple staff / Civil Service fast streamer / Similar devices) ?'[]'$  - 0 ?Simple Staple / Suture removal (25 or less) ?'[]'$  - 0 ?Complex Staple / Suture removal (26 or more) ?'[]'$  - 0 ?Hypo / Hyperglycemic Management (close monitor of Blood Glucose) ?'[]'$  - 0 ?Ankle / Brachial Index (ABI) - do not check if billed separately ?X- 1 5 ?Vital Signs ?Has the patient been seen at the hospital within the last three years: Yes ?Total Score: 80 ?Level Of Care: New/Established - Level ?3 ?Electronic Signature(s) ?Signed: 11/14/2021 4:57:35 PM By: [name] Dredge ?Entered By: [name] Dredge on 11/14/2021 11:31:29 ?[name], [name] ([id number]) ?-------------------------------------------------------------------------------- ?Encounter Discharge Information Details ?Patient Name: [name], [name] ?Date of Service: 11/14/2021 10:30 AM ?Medical Record Number: [id number] ?Patient Account Number: [id number] ?Date of Birth/Sex: 05-21-1956 (66 y.o. F) ?Treating RN: [name] Dredge ?Primary Care Montray Kliebert: Ranae Plumber Other Clinician: ?Referring Annamary Buschman: Ranae Plumber ?Treating Marianita Botkin/Extender: Jeri Cos ?Weeks in Treatment: 15 ?Encounter Discharge Information Items ?Discharge Condition: Stable ?Ambulatory Status: [name] ?Discharge Destination: Home ?Transportation: Other ?Accompanied By:  self ?Schedule Follow-up Appointment: Yes ?Clinical Summary of Care: Patient Declined ?Electronic Signature(s) ?Signed: 11/14/2021 11:33:36 AM By: [name] Dredge ?Entered By: [name] Dredge on 11/14/2021 11:33:35 ?[name], [name] ([id number]) ?-------------------------------------------------------------------------------- ?Lower Extremity Assessment Details ?Patient Name: [name], [name] ?Date of Service: 11/14/2021 10:30 AM ?Medical Record Number: [id number] ?Patient Account Number: [id number] ?Date of Birth/Sex: 1956-06-18 (66 y.o. F) ?Treating RN: [name] Dredge ?Primary Care Reneta Niehaus: Ranae Plumber Other Clinician: ?Referring Loralye Loberg: Ranae Plumber ?Treating Leonda Cristo/Extender: Jeri Cos ?Weeks in Treatment: 15 ?Edema Assessment ?Assessed: [Left: No] [Right: No] ?Edema: [Left: Yes] [Right: Yes] ?Calf ?Left: Right: ?Point of Measurement: 39 cm From Medial Instep 78.5 cm 77.5 cm ?Ankle ?Left: Right: ?Point of Measurement: 9 cm From Medial Instep 48.5 cm 50.5 cm ?Vascular Assessment ?Pulses: ?Dorsalis Pedis ?Palpable: [Left:Yes] [Right:Yes] ?Electronic Signature(s) ?Signed: 11/14/2021 4:57:35 PM By: [name] Dredge ?Entered By: [name] Dredge on 11/14/2021 11:08:02 ?[name], [name] ([id number]) ?-------------------------------------------------------------------------------- ?Multi Wound Chart Details ?Patient Name: [name], [name] ?Date of Service: 11/14/2021 10:30 AM ?Medical Record Number: [id number] ?Patient Account Number: [id number] ?Date of Birth/Sex: 04/15/1956 (66 y.o. F) ?Treating RN: [name] Dredge ?Primary Care Deshondra Worst: Ranae Plumber Other Clinician: ?Referring Tereso Unangst: Ranae Plumber ?Treating Rocsi Hazelbaker/Extender: Jeri Cos ?Weeks in Treatment: 15 ?Vital Signs ?Height(in): 63 ?Pulse(bpm): 76 ?Weight(lbs): 372 ?Blood Pressure(mmHg): 134/86 ?Body Mass Index(BMI): 65.9 ?Temperature(??F): 98.2 ?Respiratory Rate(breaths/min): 18 ?Photos: [N/A:N/A] ?Wound Location: Left, Medial, Anterior Lower Leg  N/A N/A ?Wounding Event: Gradually Appeared N/A N/A ?Primary Etiology: Lymphedema N/A N/A ?Comorbid History: Cataracts, Chronic sinus N/A N/A ?problems/congestion, Anemia, ?Lymphedema, Asthma, Sleep ?Apnea, Tuber

## 2021-11-14 NOTE — Progress Notes (Addendum)
[name], [name] ([id number]) ?Visit Report for 11/14/2021 ?Chief Complaint Document Details ?Patient Name: [name], [name] ?Date of Service: 11/14/2021 10:30 AM ?Medical Record Number: [id number] ?Patient Account Number: [id number] ?Date of Birth/Sex: 14-Jan-1956 (66 y.o. F) ?Treating RN: [name] Dredge ?Primary Care Provider: Ranae Plumber Other Clinician: ?Referring Provider: Ranae Plumber ?Treating Provider/Extender: Jeri Cos ?Weeks in Treatment: 15 ?Information Obtained from: Patient ?Chief Complaint ?Bilateral LE lymphedema with Left LE Ulcers ?Electronic Signature(s) ?Signed: 11/14/2021 10:35:31 AM By: Worthy Keeler PA-C ?Entered By: Worthy Keeler on 11/14/2021 10:35:30 ?[name], [name] ([id number]) ?-------------------------------------------------------------------------------- ?HPI Details ?Patient Name: [name], [name] ?Date of Service: 11/14/2021 10:30 AM ?Medical Record Number: [id number] ?Patient Account Number: [id number] ?Date of Birth/Sex: [birth year] (66 y.o. F) ?Treating RN: [name] Dredge ?Primary Care Provider: Ranae Plumber Other Clinician: ?Referring Provider: Ranae Plumber ?Treating Provider/Extender: Jeri Cos ?Weeks in Treatment: 15 ?History of Present Illness ?HPI Description: The patient is a 66 year old female with history of hypertension and a long-standing history of bilateral lower extremity ?lymphedema (first presented on 4/2) . She has had open ulcers in the past which have always responded to compression therapy. She had briefly ?been to a lymphedema clinic in the past which helped her at the time. ?this time around she stopped treatment of her lymphedema pumps approximately 2 weeks ago because of some pain in the knees and then ?noticed the right leg getting worse. She was seen by her PCP who put her on clindamycin 4 times a day 2 days ago. ?The patient has seen [facility] and Dr. Delana Meyer had seen her last year where a vascular study including venous and arterial duplex studies  were within ?normal limits. ?he had recommended compression stockings and lymphedema pumps and the patient has been using this in about 2 weeks ago. ?She is known to be diabetic but in the past few time she's gone to her primary care doctor her hemoglobin A1c has been normal. ?02/11/2015 - after her last visit she took my advice and went to the ER regarding the progressive cellulitis of her right lower extremity and she ?was admitted between July 17 and 22nd. She received IV antibiotics and then was sent home on a course of steroid-induced and oral antibiotics. ?She has improved much since then. ?02/17/2015 -- she has been doing fine and the weeping of her legs has remarkably gone down. She has no fresh issues. ?READMISSION ?01/15/18 ?This patient was given this clinic before most recently in 2016 seen by Dr. Con Memos. She has massive bilateral lymphedema and over the last 2 ?months this had weeping edema out of the left leg. She has compression pumps but her compliance with these has been minimal. She has [facility] they've been using TCA/ABDs/kerlix under an Ace wrap.she has had recent problems with cellulitis. She was apparently seen ?in the ER and 12/23/17 and given clindamycin. She was then followed by her primary doctor and given doxycycline and Keflex. The pain seems to ?have settled down. ?In April 2018 the patient had arterial studies done at [facility]. This showed triphasic waveforms throughout the right leg and ?mostly triphasic waveforms on the left except for monophasic at the posterior tibial artery distally. She was not felt to have evidence of right lower ?extremity arterial stenosis or significant problems on the left side. 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 She also had ?a right lower extremity venous Doppler in January 2018 this was limited by the patient's body  habitus and lymphedema. Most of the proximal ?veins were not visualized ?The  patient presents with an area of denuded skin on the anterior medial part of the left calf. There is weeping edema fluid here. ?01/22/18; the patient has somewhat better edema control using her compression pumps twice a day and as a result she has much better ?epithelialization on the left anterior calf area. Only a small open area remains. ?01/29/18; the patient has been compliant with her compression pumps. Both the areas on her calf that healed. The remaining area on the left ?anterior leg is fully epithelialized ?Readmission: ?02/20/2019 upon evaluation today patient presents for reevaluation due to issues that she is having with the bilateral lower extremities. She ?actually has wounds open on both legs. On the right she has an area in the crease of her leg on the right around the knee region which is actually ?draining quite a bit and actually has some fungal type appearance to it. She has been on nystatin powder that seems to have helped to some ?degree. In regard to the left lower extremity this is actually in the lower portion of her leg closer to the ankle and again is continuing to drain as ?well unfortunately. There does not appear to be any signs of active infection at this time which is good news. No fevers, chills, nausea, vomiting, ?or diarrhea. She tells me that since she was seen last year she is actually been doing quite well for the most part with regard to her lower ?extremities. Unfortunately she now is experiencing a little bit more drainage at this time. She is concerned about getting this under control so that ?it does not get significantly worse. ?02/27/2019 on evaluation today patient appears to be doing somewhat better in regard to her bilateral lower extremity wounds. She has been ?tolerating the dressing changes without complication. Fortunately there is no signs of active infection at this point. No fevers, chills, nausea, ?vomiting, or diarrhea. She did get her dressing supplies which is  excellent news she was extremely excited to get these. She also got paperwork ?from [employer] for their financial assistance program where they may be able to help her out in the future if needed with supplies at discounted ?prices. ?03/06/2019 on evaluation today patient appears to be doing a little worse with regard to both areas of weeping on her bilateral lower extremities. ?This is around the right medial knee and just above the left ankle. With that being said she is unfortunately not doing as well as I would like to ?see. I feel like she may need to potentially go see someone at the lymphedema clinic as the wraps that she needs or even beyond what we can do ?here at the wound care center. She really does not have wounds she just has open areas of weeping that are causing some difficulty for her. ?Subsequently because of this and the moisture I am concerned about the potential for infection I am going to likely give her a prophylactic ?antibiotic today, Keflex, just to be on the safe side. Nonetheless again there is no obvious signs of active infection at this time. ?03/13/2019 on evaluation today patient appears to be doing well with regard to her bilateral lower extremities where she has been weeping ?compared to even last week's evaluation. I see some areas of new skin growth which is excellent and overall I am very pleased with how things ?seem to be progressing. No fevers, chills, nausea, vomiting, or diarrhea. ?[place],  [name] ([id number]) ?03/20/2019 on evaluation today patient unfortunately is continuing to have issues with significant edema of the left lower extremity. Her right side ?seems to be doing much better. Unfortunately her left side is showing increased weeping of the lower portion of her leg. This is quite unfortunate ?obviously we were hoping to get her into the lymphedema clinic they really do not seem to when I see her how if she is draining. Despite the fact ?this is really not wound related  but more lymphedema weeping related. Nonetheless I do not know that this can be helpful for her to even go for ?that appointment since again I am not sure there is much that they would actually do at this p

## 2021-11-15 ENCOUNTER — Ambulatory Visit: Payer: Medicare HMO | Admitting: Family

## 2021-11-21 ENCOUNTER — Encounter: Payer: Medicare HMO | Admitting: Physician Assistant

## 2021-11-21 DIAGNOSIS — E11621 Type 2 diabetes mellitus with foot ulcer: Secondary | ICD-10-CM | POA: Diagnosis not present

## 2021-11-21 NOTE — Progress Notes (Signed)
? Patient ID: [name], female    DOB: [birth year], 66 y.o.   MRN: [id number] ? ?HPI ? ?[name] is a 66 y/o female with a history of asthma, HTN, arthritis, lymphedema, sleep apnea, spinal stenosis, obesity, previous tobacco use and chronic heart failure.  ? ?Echo report from 11/01/21 reviewed and showed an EF of 60-65%.  ? ?Admitted 10/31/21 due to worsening shortness of breath, edema and orthopnea. Initially given IV lasix with transition to oral diuretics with addition of twice weekly metolazone. Cardiology consult obtained. Discharged after 6 days.  ? ?She presents today for her initial visit with a chief complaint of moderate shortness of breath with moderate exertion. Describes this as chronic in nature. She has associated fatigue, cough, chronic lymphedema, constipation, chronic difficulty sleeping and dizziness/lightheadedness along with this. She denies any abdominal distention, palpitations or chest pain.  ? ?Goes to the wound clinic weekly for leg wrapping for her lymphedema but she can also wrap her legs at home. She was able to walk from the [place] entrance to our office in the [facility] with using her cane. Was quite SOB upon arrival but recovered quickly.  ? ?She has been trying to weigh herself using 2 scales as her lymphedema is so severe that she is unable to stand on one small digital scale at home. She currently has 1 foot on 1 scale and another foot on the other scale at home but says that the weight fluctuates 10-15 pounds. She confirms that the scales are on a flat surface.  ? ?Past Medical History:  ?Diagnosis Date  ? Arthritis   ? Asthma   ? CHF (congestive heart failure) ([place])   ? Enlarged heart   ? Hypertension   ? Lymphedema   ? Sleep apnea   ? Spinal stenosis   ? ?Past Surgical History:  ?Procedure Laterality Date  ? COLONOSCOPY WITH PROPOFOL N/A 03/12/2018  ? Procedure: COLONOSCOPY WITH PROPOFOL;  Surgeon: Manya Silvas, MD;  Location: [facility] ENDOSCOPY;   Service: Endoscopy;  Laterality: N/A;  ? NO PAST SURGERIES    ? ?Family History  ?Problem Relation Age of Onset  ? Thyroid disease Other   ? Breast cancer Maternal Aunt   ? ?Social History  ? ?Tobacco Use  ? Smoking status: Former  ?  Packs/day: 0.25  ?  Types: Cigarettes  ?  Start date: 07/09/1976  ?  Quit date: 07/09/1990  ?  Years since quitting: 31.3  ? Smokeless tobacco: Current  ?  Types: Snuff  ?Substance Use Topics  ? Alcohol use: No  ? ?Allergies  ?Allergen Reactions  ? Ace Inhibitors Hives and Swelling  ? Ibuprofen Hives and Swelling  ? Vancomycin Itching and Nausea And Vomiting  ? ?Prior to Admission medications   ?Medication Sig Start Date End Date Taking? Authorizing Provider  ?acetaminophen (TYLENOL) 500 MG tablet Take 500-1,000 mg by mouth every 6 (six) hours as needed for mild pain or fever.   Yes [provider]  ?atenolol (TENORMIN) 25 MG tablet Take 1 tablet (25 mg total) by mouth daily. 11/06/21 02/04/22 Yes Enzo Bi, MD  ?cetirizine (ZYRTEC) 10 MG tablet Take 1 tablet (10 mg total) by mouth daily. 12/25/20  Yes Lavina Hamman, MD  ?furosemide (LASIX) 40 MG tablet Take 1 tablet (40 mg total) by mouth daily. 11/06/21 02/04/22 Yes Enzo Bi, MD  ?metolazone (ZAROXOLYN) 2.5 MG tablet Take 1 tab every Tue and Sat 30 minutes prior Lasix. 11/06/21  Yes  Enzo Bi, MD  ?pregabalin (LYRICA) 50 MG capsule Take 1 capsule (50 mg total) by mouth 2 (two) times daily. 10/08/21  Yes Wieting, Richard, MD  ?senna-docusate (SENOKOT-S) 8.6-50 MG tablet Take 1 tablet by mouth 2 (two) times daily.   Yes [provider]  ? ?Review of Systems  ?Constitutional:  Positive for fatigue. Negative for appetite change.  ?HENT:  Positive for postnasal drip. Negative for congestion and sore throat.   ?Eyes: Negative.   ?Respiratory:  Positive for cough (dry) and shortness of breath (with moderate exertion).   ?Cardiovascular:  Positive for leg swelling (measurements less in legs from last week). Negative for chest pain and  palpitations.  ?Gastrointestinal:  Positive for constipation. Negative for abdominal distention and abdominal pain.  ?Endocrine: Negative.   ?Genitourinary: Negative.   ?Musculoskeletal:  Negative for back pain and neck pain.  ?Skin: Negative.   ?     Severe lymphedema  ?Allergic/Immunologic: Negative.   ?Neurological:  Positive for dizziness and light-headedness.  ?Hematological:  Negative for adenopathy. Does not bruise/bleed easily.  ?Psychiatric/Behavioral:  Positive for sleep disturbance (chronic trouble falling asleep; sleeps in recliner). Negative for dysphoric mood. The patient is nervous/anxious (due to coming to appointment).   ? ?Vitals:  ? 11/22/21 1238  ?BP: 117/73  ?Pulse: 68  ?Resp: 18  ?SpO2: 98%  ?Weight: (!) 375 lb 6 oz (170.3 kg)  ?Height: '5\' 3"'$  (1.6 m)  ? ?Wt Readings from Last 3 Encounters:  ?11/22/21 (!) 375 lb 6 oz (170.3 kg)  ?11/06/21 (!) 361 lb 8 oz (164 kg)  ?10/08/21 (!) 367 lb 15.2 oz (166.9 kg)  ? ?Lab Results  ?Component Value Date  ? CREATININE 1.26 (H) 11/06/2021  ? CREATININE 1.02 (H) 11/05/2021  ? CREATININE 1.07 (H) 11/04/2021  ? ?Physical Exam ?Vitals and nursing note reviewed.  ?Constitutional:   ?   Appearance: She is well-developed.  ?HENT:  ?   Head: Normocephalic and atraumatic.  ?Cardiovascular:  ?   Rate and Rhythm: Normal rate and regular rhythm.  ?Pulmonary:  ?   Effort: Pulmonary effort is normal. No accessory muscle usage.  ?   Breath sounds: No wheezing, rhonchi or rales.  ?Abdominal:  ?   Palpations: Abdomen is soft.  ?   Tenderness: There is no abdominal tenderness.  ?Musculoskeletal:  ?   Cervical back: Normal range of motion and neck supple.  ?   Right lower leg: Edema present.  ?   Left lower leg: Edema present.  ?   Comments: Severe lymphedema  ?Skin: ?   General: Skin is warm and dry.  ?Neurological:  ?   General: No focal deficit present.  ?   Mental Status: She is alert and oriented to person, place, and time.  ?Psychiatric:     ?   Mood and Affect: Mood  normal.     ?   Behavior: Behavior normal.  ? ?Assessment & Plan: ? ?1: Chronic heart failure with preserved ejection fraction without structural changes- ?- NYHA class III ?- euvolemic today ?- attempting to weigh daily with standing on 2 scales but it's been fluctuating quite a bit; she's going to look into finding a wide enough scale for her to stand on ?- limiting her sodium intake but occasionally adds "a pinch" of salt to meets; does not like [name], garlic powder etc ?- understands to keep daily fluid intake to 60-64 ounces/ day ?- will check BMP today as she's been taking twice weekly metolazone and  she's noticed some dizziness since she's been taking this ?- facial/mouth swelling with ACEi ?- saw cardiology Margarito Courser) 11/15/21 ?- BNP 10/31/21 was 123.9 ? ?2: HTN- ?- BP looks good (117/73) ?- sees PCP Daryel Gerald) @ [facility] and she has to call and schedule an appointment ?- BMP 11/06/21 reviewed and showed sodium 141, potassium 4.1, creatinine 1.26 and GFR 47 ? ?3: Lymphedema- ?- went to wound center 11/21/21 for leg wrapping ?- she says that she can re-wrap the legs at home and prior to hospitalization, she was unable to do so ? ?4: Sleep apnea- ?- wearing CPAP ? ? ?Medication bottles reviewed.  ? ?Return in 1 month, sooner if needed ?

## 2021-11-21 NOTE — Progress Notes (Addendum)
[name], [name] ([id number]) ?Visit Report for 11/21/2021 ?Chief Complaint Document Details ?Patient Name: [name], [name] ?Date of Service: 11/21/2021 10:30 AM ?Medical Record Number: [id number] ?Patient Account Number: [id number] ?Date of Birth/Sex: 1955/11/16 (66 y.o. F) ?Treating RN: [name] Dredge ?Primary Care Provider: Ranae Plumber Other Clinician: ?Referring Provider: Ranae Plumber ?Treating Provider/Extender: Jeri Cos ?Weeks in Treatment: 16 ?Information Obtained from: Patient ?Chief Complaint ?Bilateral LE lymphedema with Left LE Ulcers ?Electronic Signature(s) ?Signed: 11/21/2021 10:40:05 AM By: Worthy Keeler PA-C ?Entered By: Worthy Keeler on 11/21/2021 10:40:05 ?[name], [name] ([id number]) ?-------------------------------------------------------------------------------- ?Debridement Details ?Patient Name: [name], [name] ?Date of Service: 11/21/2021 10:30 AM ?Medical Record Number: [id number] ?Patient Account Number: [id number] ?Date of Birth/Sex: 10/13/1955 (66 y.o. F) ?Treating RN: [name] Dredge ?Primary Care Provider: Ranae Plumber Other Clinician: ?Referring Provider: Ranae Plumber ?Treating Provider/Extender: Jeri Cos ?Weeks in Treatment: 16 ?Debridement Performed for ?Wound #10 Left,Medial,Anterior Lower Leg ?Assessment: ?Performed By: Physician Tommie Sams., PA-C ?Debridement Type: Debridement ?Level of Consciousness (Pre- ?Awake and Alert ?procedure): ?Pre-procedure Verification/Time Out ?Yes - 11:27 ?Taken: ?Total Area Debrided (L x W): 2.7 (cm) x 3.7 (cm) = 9.99 (cm?) ?Tissue and other material ?Viable, Non-Viable, Slough, Subcutaneous, Biofilm, Slough ?debrided: ?Level: Skin/Subcutaneous Tissue ?Debridement Description: Excisional ?Instrument: Curette ?Bleeding: Minimum ?Hemostasis Achieved: Pressure ?Response to Treatment: Procedure was tolerated well ?Level of Consciousness (Post- ?Awake and Alert ?procedure): ?Post Debridement Measurements of Total Wound ?Length: (cm)  2.7 ?Width: (cm) 3.7 ?Depth: (cm) 0.1 ?Volume: (cm?) 0.785 ?Character of Wound/Ulcer Post Debridement: Stable ?Post Procedure Diagnosis ?Same as Pre-procedure ?Electronic Signature(s) ?Signed: 11/21/2021 4:14:45 PM By: [name] Dredge ?Signed: 11/21/2021 4:58:37 PM By: Worthy Keeler PA-C ?Entered By: [name] Dredge on 11/21/2021 11:29:37 ?[name], [name] ([id number]) ?-------------------------------------------------------------------------------- ?HPI Details ?Patient Name: [name], [name] ?Date of Service: 11/21/2021 10:30 AM ?Medical Record Number: [id number] ?Patient Account Number: [id number] ?Date of Birth/Sex: Feb 12, 1956 (66 y.o. F) ?Treating RN: [name] Dredge ?Primary Care Provider: Ranae Plumber Other Clinician: ?Referring Provider: Ranae Plumber ?Treating Provider/Extender: Jeri Cos ?Weeks in Treatment: 16 ?History of Present Illness ?HPI Description: The patient is a 66 year old female with history of hypertension and a long-standing history of bilateral lower extremity ?lymphedema (first presented on 4/2) . She has had open ulcers in the past which have always responded to compression therapy. She had briefly ?been to a lymphedema clinic in the past which helped her at the time. ?this time around she stopped treatment of her lymphedema pumps approximately 2 weeks ago because of some pain in the knees and then ?noticed the right leg getting worse. She was seen by her PCP who put her on clindamycin 4 times a day 2 days ago. ?The patient has seen [facility] and Dr. Delana Meyer had seen her last year where a vascular study including venous and arterial duplex studies were within ?normal limits. ?he had recommended compression stockings and lymphedema pumps and the patient has been using this in about 2 weeks ago. ?She is known to be diabetic but in the past few time she's gone to her primary care doctor her hemoglobin A1c has been normal. ?02/11/2015 - after her last visit she took my advice and went to the  ER regarding the progressive cellulitis of her right lower extremity and she ?was admitted between July 17 and 22nd. She received IV antibiotics and then was sent home on a course of steroid-induced and oral antibiotics. ?She has improved much since then. ?02/17/2015 -- she has been doing fine and  the weeping of her legs has remarkably gone down. She has no fresh issues. ?READMISSION ?01/15/18 ?This patient was given this clinic before most recently in 2016 seen by Dr. Con Memos. She has massive bilateral lymphedema and over the last 2 ?months this had weeping edema out of the left leg. She has compression pumps but her compliance with these has been minimal. She has [facility] they've been using TCA/ABDs/kerlix under an Ace wrap.she has had recent problems with cellulitis. She was apparently seen ?in the ER and 12/23/17 and given clindamycin. She was then followed by her primary doctor and given doxycycline and Keflex. The pain seems to ?have settled down. ?In April 2018 the patient had arterial studies done at [facility]. This showed triphasic waveforms throughout the right leg and ?mostly triphasic waveforms on the left except for monophasic at the posterior tibial artery distally. She was not felt to have evidence of right lower ?extremity arterial stenosis or significant problems on the left side. 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 She also had ?a right lower extremity venous Doppler in January 2018 this was limited by the patient's body habitus and lymphedema. Most of the proximal ?veins were not visualized ?The patient presents with an area of denuded skin on the anterior medial part of the left calf. There is weeping edema fluid here. ?01/22/18; the patient has somewhat better edema control using her compression pumps twice a day and as a result she has much better ?epithelialization on the left anterior calf area. Only a small open area remains. ?01/29/18;  the patient has been compliant with her compression pumps. Both the areas on her calf that healed. The remaining area on the left ?anterior leg is fully epithelialized ?Readmission: ?02/20/2019 upon evaluation today patient presents for reevaluation due to issues that she is having with the bilateral lower extremities. She ?actually has wounds open on both legs. On the right she has an area in the crease of her leg on the right around the knee region which is actually ?draining quite a bit and actually has some fungal type appearance to it. She has been on nystatin powder that seems to have helped to some ?degree. In regard to the left lower extremity this is actually in the lower portion of her leg closer to the ankle and again is continuing to drain as ?well unfortunately. There does not appear to be any signs of active infection at this time which is good news. No fevers, chills, nausea, vomiting, ?or diarrhea. She tells me that since she was seen last year she is actually been doing quite well for the most part with regard to her lower ?extremities. Unfortunately she now is experiencing a little bit more drainage at this time. She is concerned about getting this under control so that ?it does not get significantly worse. ?02/27/2019 on evaluation today patient appears to be doing somewhat better in regard to her bilateral lower extremity wounds. She has been ?tolerating the dressing changes without complication. Fortunately there is no signs of active infection at this point. No fevers, chills, nausea, ?vomiting, or diarrhea. She did get her dressing supplies which is excellent news she was extremely excited to get these. She also got paperwork ?from [employer] for their financial assistance program where they may be able to help her out in the future if needed with supplies at discounted ?prices. ?03/06/2019 on evaluation today patient appears to be doing a little worse with regard to  both areas of weeping on her  bilateral lower extremities. ?This is around the right medial knee and just above the left ankle. With that being said she is unfortunately not doing as well as I would like to ?see. I feel like she may nee

## 2021-11-21 NOTE — Progress Notes (Addendum)
[name], [name] ([id number]) ?Visit Report for 11/21/2021 ?Arrival Information Details ?Patient Name: [name], [name] ?Date of Service: 11/21/2021 10:30 AM ?Medical Record Number: [id number] ?Patient Account Number: [id number] ?Date of Birth/Sex: December 18, 1955 (66 y.o. F) ?Treating RN: [name] Dredge ?Primary Care Jayven Naill: Ranae Plumber Other Clinician: ?Referring Klara Stjames: Ranae Plumber ?Treating Nelvin Tomb/Extender: [name] ?Weeks in Treatment: 16 ?Visit Information History Since Last Visit ?Added or deleted any medications: Yes ?Patient Arrived: [name] ?Any new allergies or adverse reactions: No ?Arrival Time: 10:37 ?Had a fall or experienced change in No ?Accompanied By: self ?activities of daily living that may affect ?Transfer Assistance: None ?risk of falls: ?Patient Identification Verified: Yes ?Hospitalized since last visit: No ?Has Dressing in Place as Prescribed: Yes ?Has Compression in Place as Prescribed: Yes ?Pain Present Now: No ?Electronic Signature(s) ?Signed: 11/21/2021 4:14:45 PM By: [name] Dredge ?Entered By: [name] Dredge on 11/21/2021 10:42:41 ?XAYLA, PUZIO ([id number]) ?-------------------------------------------------------------------------------- ?Clinic Level of Care Assessment Details ?Patient Name: [name], [name] ?Date of Service: 11/21/2021 10:30 AM ?Medical Record Number: [id number] ?Patient Account Number: [id number] ?Date of Birth/Sex: 07/29/1955 (66 y.o. F) ?Treating RN: [name] Dredge ?Primary Care Analeese Andreatta: Ranae Plumber Other Clinician: ?Referring Amdrew Oboyle: Ranae Plumber ?Treating Reema Chick/Extender: [name] ?Weeks in Treatment: 16 ?Clinic Level of Care Assessment Items ?TOOL 1 Quantity Score ?'[]'$  - Use when EandM and Procedure is performed on INITIAL visit 0 ?ASSESSMENTS - Nursing Assessment / Reassessment ?'[]'$  - General Physical Exam (combine w/ comprehensive assessment (listed just below) when performed on new ?0 ?pt. evals) ?'[]'$  - 0 ?Comprehensive Assessment (HX,  ROS, Risk Assessments, Wounds Hx, etc.) ?ASSESSMENTS - Wound and Skin Assessment / Reassessment ?'[]'$  - Dermatologic / Skin Assessment (not related to wound area) 0 ?ASSESSMENTS - Ostomy and/or Continence Assessment and Care ?'[]'$  - Incontinence Assessment and Management 0 ?'[]'$  - 0 ?Ostomy Care Assessment and Management (repouching, etc.) ?PROCESS - Coordination of Care ?'[]'$  - Simple Patient / Family Education for ongoing care 0 ?'[]'$  - 0 ?Complex (extensive) Patient / Family Education for ongoing care ?'[]'$  - 0 ?Staff obtains Consents, Records, Test Results / Process Orders ?'[]'$  - 0 ?Staff telephones HHA, Nursing Homes / Clarify orders / etc ?'[]'$  - 0 ?Routine Transfer to another Facility (non-emergent condition) ?'[]'$  - 0 ?Routine Hospital Admission (non-emergent condition) ?'[]'$  - 0 ?New Admissions / Biomedical engineer / Ordering NPWT, Apligraf, etc. ?'[]'$  - 0 ?Emergency Hospital Admission (emergent condition) ?PROCESS - Special Needs ?'[]'$  - Pediatric / Minor Patient Management 0 ?'[]'$  - 0 ?Isolation Patient Management ?'[]'$  - 0 ?Hearing / Language / Visual special needs ?'[]'$  - 0 ?Assessment of Community assistance (transportation, D/C planning, etc.) ?'[]'$  - 0 ?Additional assistance / Altered mentation ?'[]'$  - 0 ?Support Surface(s) Assessment (bed, cushion, seat, etc.) ?INTERVENTIONS - Miscellaneous ?'[]'$  - External ear exam 0 ?'[]'$  - 0 ?Patient Transfer (multiple staff / Civil Service fast streamer / Similar devices) ?'[]'$  - 0 ?Simple Staple / Suture removal (25 or less) ?'[]'$  - 0 ?Complex Staple / Suture removal (26 or more) ?'[]'$  - 0 ?Hypo/Hyperglycemic Management (do not check if billed separately) ?'[]'$  - 0 ?Ankle / Brachial Index (ABI) - do not check if billed separately ?Has the patient been seen at the hospital within the last three years: Yes ?Total Score: 0 ?Level Of Care: ____ ?[name], [name] ([id number]) ?Electronic Signature(s) ?Signed: 11/21/2021 4:14:45 PM By: [name] Dredge ?Entered By: [name] Dredge on 11/21/2021 13:11:47 ?[name], [name]  ([id number]) ?-------------------------------------------------------------------------------- ?Encounter Discharge Information Details ?Patient Name: [name] ?Date of Service:  11/21/2021 10:30 AM ?Medical Record Number: [id number] ?Patient Account Number: [id number] ?Date of Birth/Sex: Feb 16, 1956 (66 y.o. F) ?Treating RN: [name] Dredge ?Primary Care Advit Trethewey: Ranae Plumber Other Clinician: ?Referring Minnette Merida: Ranae Plumber ?Treating Kynzley Dowson/Extender: [name] ?Weeks in Treatment: 16 ?Encounter Discharge Information Items Post Procedure Vitals ?Discharge Condition: Stable ?Temperature (?F): 98.2 ?Ambulatory Status: [name] ?Pulse (bpm): 70 ?Discharge Destination: Home ?Respiratory Rate (breaths/min): 18 ?Transportation: Other ?Blood Pressure (mmHg): 118/76 ?Accompanied By: self ?Schedule Follow-up Appointment: Yes ?Clinical Summary of Care: Patient Declined ?Electronic Signature(s) ?Signed: 11/21/2021 1:16:13 PM By: [name] Dredge ?Entered By: [name] Dredge on 11/21/2021 13:16:13 ?[name], [name] ([id number]) ?-------------------------------------------------------------------------------- ?Lower Extremity Assessment Details ?Patient Name: [name], [name] ?Date of Service: 11/21/2021 10:30 AM ?Medical Record Number: [id number] ?Patient Account Number: [id number] ?Date of Birth/Sex: [birth year] (66 y.o. F) ?Treating RN: [name] Dredge ?Primary Care Fedrick Cefalu: Ranae Plumber Other Clinician: ?Referring Meaghann Choo: Ranae Plumber ?Treating Oberia Beaudoin/Extender: [name] ?Weeks in Treatment: 16 ?Edema Assessment ?Assessed: [Left: No] [Right: No] ?[Left: Edema] [Right: :] ?Calf ?Left: Right: ?Point of Measurement: 39 cm From Medial Instep 76 cm 89 cm ?Ankle ?Left: Right: ?Point of Measurement: 9 cm From Medial Instep 51 cm 47.2 cm ?Vascular Assessment ?Pulses: ?Dorsalis Pedis ?Palpable: [Left:Yes] [Right:Yes] ?Electronic Signature(s) ?Signed: 11/21/2021 4:14:45 PM By: [name] Dredge ?Entered By: [name] Dredge on 11/21/2021 10:54:35 ?[name], [name] ([id number]) ?-------------------------------------------------------------------------------- ?Multi Wound Chart Details ?Patient Name: [name], [name] ?Date of Service: 11/21/2021 10:30 AM ?Medical Record Number: [id number] ?Patient Account Number: [id number] ?Date of Birth/Sex: 1955-11-04 (66 y.o. F) ?Treating RN: [name] Dredge ?Primary Care Kimi Bordeau: Ranae Plumber Other Clinician: ?Referring Kadeisha Betsch: Ranae Plumber ?Treating Lakiesha Ralphs/Extender: [name] ?Weeks in Treatment: 16 ?Vital Signs ?Height(in): 63 ?Pulse(bpm): 70 ?Weight(lbs): 372 ?Blood Pressure(mmHg): 118/76 ?Body Mass Index(BMI): 65.9 ?Temperature(??F): 98.2 ?Respiratory Rate(breaths/min): 18 ?Photos: [N/A:N/A] ?Wound Location: Left, Medial, Anterior Lower Leg N/A N/A ?Wounding Event: Gradually Appeared N/A N/A ?Primary Etiology: Lymphedema N/A N/A ?Comorbid History: Cataracts, Chronic sinus N/A N/A ?problems/congestion, Anemia, ?Lymphedema, Asthma, Sleep ?Apnea, Tuberculosis, Angina, ?Hypertension, Gout, Osteoarthritis ?Date Acquired: 10/17/2021 N/A N/A ?Weeks of Treatment: 5 N/A N/A ?Wound Status: Open N/A N/A ?Wound Recurrence: No N/A N/A ?Measurements L x W x D (cm) 2.7x3.7x0.1 N/A N/A ?Area (cm?) : 7.846 N/A N/A ?Volume (cm?) : 0.785 N/A N/A ?% Reduction in Area: 0.10% N/A N/A ?% Reduction in Volume: 0.00% N/A N/A ?Classification: Full Thickness Without Exposed N/A N/A ?Support Structures ?Exudate Amount: Medium N/A N/A ?Exudate Type: Serosanguineous N/A N/A ?Exudate Color: red, brown N/A N/A ?Granulation Amount: Medium (34-66%) N/A N/A ?Granulation Quality: Red N/A N/A ?Necrotic Amount: Medium (34-66%) N/A N/A ?Exposed Structures: ?Fat Layer (Subcutaneous Tissue): N/A N/A ?Yes ?Epithelialization: Small (1-33%) N/A N/A ?Treatment Notes ?Electronic Signature(s) ?Signed: 11/21/2021 4:14:45 PM By: [name] Dredge ?Entered By: [name] Dredge on 11/21/2021 11:02:36 ?[name], [name]  ([id number]) ?-------------------------------------------------------------------------------- ?Multi-Disciplinary Care Plan Details ?Patient Name: CAYLIN, NASS ?Date of Service: 11/21/2021 10:30 AM ?Medical Record N

## 2021-11-22 ENCOUNTER — Ambulatory Visit: Payer: Medicare HMO | Attending: Family | Admitting: Family

## 2021-11-22 ENCOUNTER — Encounter: Payer: Self-pay | Admitting: Family

## 2021-11-22 VITALS — BP 117/73 | HR 68 | Resp 18 | Ht 63.0 in | Wt 375.4 lb

## 2021-11-22 DIAGNOSIS — E669 Obesity, unspecified: Secondary | ICD-10-CM | POA: Insufficient documentation

## 2021-11-22 DIAGNOSIS — I5032 Chronic diastolic (congestive) heart failure: Secondary | ICD-10-CM | POA: Diagnosis present

## 2021-11-22 DIAGNOSIS — G4730 Sleep apnea, unspecified: Secondary | ICD-10-CM | POA: Diagnosis not present

## 2021-11-22 DIAGNOSIS — J45909 Unspecified asthma, uncomplicated: Secondary | ICD-10-CM | POA: Diagnosis not present

## 2021-11-22 DIAGNOSIS — I110 Hypertensive heart disease with heart failure: Secondary | ICD-10-CM | POA: Diagnosis not present

## 2021-11-22 DIAGNOSIS — K5900 Constipation, unspecified: Secondary | ICD-10-CM | POA: Diagnosis not present

## 2021-11-22 DIAGNOSIS — M1990 Unspecified osteoarthritis, unspecified site: Secondary | ICD-10-CM | POA: Diagnosis not present

## 2021-11-22 DIAGNOSIS — I890 Lymphedema, not elsewhere classified: Secondary | ICD-10-CM | POA: Diagnosis not present

## 2021-11-22 DIAGNOSIS — M4800 Spinal stenosis, site unspecified: Secondary | ICD-10-CM | POA: Insufficient documentation

## 2021-11-22 DIAGNOSIS — I10 Essential (primary) hypertension: Secondary | ICD-10-CM | POA: Diagnosis not present

## 2021-11-22 DIAGNOSIS — G4733 Obstructive sleep apnea (adult) (pediatric): Secondary | ICD-10-CM

## 2021-11-22 DIAGNOSIS — Z9989 Dependence on other enabling machines and devices: Secondary | ICD-10-CM

## 2021-11-22 LAB — BASIC METABOLIC PANEL
Anion gap: 16 — ABNORMAL HIGH (ref 5–15)
BUN: 70 mg/dL — ABNORMAL HIGH (ref 8–23)
CO2: 27 mmol/L (ref 22–32)
Calcium: 9.6 mg/dL (ref 8.9–10.3)
Chloride: 97 mmol/L — ABNORMAL LOW (ref 98–111)
Creatinine, Ser: 2 mg/dL — ABNORMAL HIGH (ref 0.44–1.00)
GFR, Estimated: 27 mL/min — ABNORMAL LOW (ref >60)
Glucose, Bld: 102 mg/dL — ABNORMAL HIGH (ref 70–99)
Potassium: 3.1 mmol/L — ABNORMAL LOW (ref 3.5–5.1)
Sodium: 140 mmol/L (ref 135–145)

## 2021-11-22 NOTE — Patient Instructions (Addendum)
Continue weighing daily and call for an overnight weight gain of 3 pounds or more or a weekly weight gain of more than 5 pounds. ? ? ?If you have voicemail, please make sure your mailbox is cleaned out so that we may leave a message and please make sure to listen to any voicemails.  ? ? ?Keep daily fluid intake to 60-64 ounces of fluid every day ?

## 2021-11-23 ENCOUNTER — Telehealth: Payer: Self-pay

## 2021-11-23 ENCOUNTER — Other Ambulatory Visit: Payer: Self-pay | Admitting: Family

## 2021-11-23 DIAGNOSIS — I5032 Chronic diastolic (congestive) heart failure: Secondary | ICD-10-CM

## 2021-11-23 MED ORDER — FUROSEMIDE 40 MG PO TABS: 20.0000 mg | ORAL_TABLET | Freq: Every day | ORAL | 2 refills | Status: DC

## 2021-11-23 MED ORDER — POTASSIUM CHLORIDE CRYS ER 20 MEQ PO TBCR: 20.0000 meq | EXTENDED_RELEASE_TABLET | Freq: Every day | ORAL | 3 refills | Status: DC

## 2021-11-23 NOTE — Telephone Encounter (Addendum)
Lab results reviewed in detail with patient. Potassium Rx sent to pt's pharmacy. Patient verbalized understanding and was able to verbalize back the medication changes with potassium and furosemide. She will go to [facility] on May 23rd at 11:30 to have labs rechecked. Explained to patient that we will call her with an update when we receive the updated lab results. Patient had no further questions or concerns at this time. Georg Ruddle, RN  ----- Message from Alisa Graff, [place] sent at 11/23/2021  9:31 AM EDT ----- Potassium level is slightly low due to the extra fluid pills so will need to add potassium tablet as 1 tablet daily. Kidney function has worsened since you were in the hospital so decrease your furosemide from 1 tablet daily to 1/2 tablet daily. Continue the booster fluid pill twice / week. Please come to the lab at [place] on Tuesday the 23rd after your wound clinic appointment to recheck this.

## 2021-11-28 ENCOUNTER — Other Ambulatory Visit: Payer: Self-pay | Admitting: Family

## 2021-11-28 ENCOUNTER — Encounter
Admission: RE | Admit: 2021-11-28 | Discharge: 2021-11-28 | Disposition: A | Discharge status: Home / Self Care | Payer: Medicare HMO | Source: Ambulatory Visit | Attending: Family | Admitting: Family

## 2021-11-28 ENCOUNTER — Encounter: Payer: Medicare HMO | Admitting: Physician Assistant

## 2021-11-28 DIAGNOSIS — I5032 Chronic diastolic (congestive) heart failure: Secondary | ICD-10-CM | POA: Diagnosis not present

## 2021-11-28 DIAGNOSIS — Z01812 Encounter for preprocedural laboratory examination: Secondary | ICD-10-CM | POA: Diagnosis present

## 2021-11-28 DIAGNOSIS — E11621 Type 2 diabetes mellitus with foot ulcer: Secondary | ICD-10-CM | POA: Diagnosis not present

## 2021-11-28 LAB — BASIC METABOLIC PANEL
Anion gap: 13 (ref 5–15)
BUN: 50 mg/dL — ABNORMAL HIGH (ref 8–23)
CO2: 28 mmol/L (ref 22–32)
Calcium: 9.1 mg/dL (ref 8.9–10.3)
Chloride: 100 mmol/L (ref 98–111)
Creatinine, Ser: 1.54 mg/dL — ABNORMAL HIGH (ref 0.44–1.00)
GFR, Estimated: 37 mL/min — ABNORMAL LOW (ref >60)
Glucose, Bld: 93 mg/dL (ref 70–99)
Potassium: 3.2 mmol/L — ABNORMAL LOW (ref 3.5–5.1)
Sodium: 141 mmol/L (ref 135–145)

## 2021-11-28 MED ORDER — POTASSIUM CHLORIDE CRYS ER 20 MEQ PO TBCR: 40.0000 meq | EXTENDED_RELEASE_TABLET | Freq: Every day | ORAL | 3 refills | Status: DC

## 2021-11-28 NOTE — Progress Notes (Signed)
Potassium increased to 34mq daily. Recheck labs in 2 weeks

## 2021-11-28 NOTE — Progress Notes (Signed)
[name], [name] ([id number]) Visit Report for 11/28/2021 Chief Complaint Document Details Patient Name: [name], [name]. Date of Service: 11/28/2021 10:30 AM Medical Record Number: [id number] Patient Account Number: [id number] Date of Birth/Sex: Nov 17, 1955 (66 y.o. F) Treating RN: Alycia Rossetti Primary Care Provider: Ranae Plumber Other Clinician: Cornell Barman Referring Provider: Ranae Plumber Treating Provider/Extender: Skipper Cliche in Treatment: 17 Information Obtained from: Patient Chief Complaint Bilateral LE lymphedema with Left LE Ulcers Electronic Signature(s) Signed: 11/28/2021 10:59:16 AM By: Worthy Keeler PA-C Entered By: Worthy Keeler on 11/28/2021 10:59:16 [name], [name] ([id number]) -------------------------------------------------------------------------------- Problem List Details Patient Name: [name] Date of Service: 11/28/2021 10:30 AM Medical Record Number: [id number] Patient Account Number: [id number] Date of Birth/Sex: [birth year] (66 y.o. 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

## 2021-11-29 ENCOUNTER — Telehealth: Payer: Self-pay

## 2021-11-29 ENCOUNTER — Other Ambulatory Visit: Payer: Self-pay | Admitting: Family

## 2021-11-29 DIAGNOSIS — I5032 Chronic diastolic (congestive) heart failure: Secondary | ICD-10-CM

## 2021-11-29 NOTE — Telephone Encounter (Addendum)
Lab results below called to patient. Patient verbalized understanding and stated back that she will take 1/2 lasix tablet daily and her booster fluid pill twice weekly, and that she will begin increasing potassium pills to 2 tablets daily  Patient states she can come to [facility] to recheck labwork after her 1 PM wound care appointment on 12/12/21. Darylene Price, FNP, notified. Patient had no further questions or concerns at this time. Georg Ruddle, RN   ----- Message from Alisa Graff, [place] sent at 11/28/2021  3:38 PM EDT ----- Kidney function has improved so continue 1/2 tablet of lasix daily and twice weekly booster fluid pill. Potassium is slightly better but still low so increase your potassium pills to 2 tablets daily (new prescription sent in). I'd like to recheck it on 12/12/21 when you are coming to the wound center. Please let us know if you want to come before your wound appointment or after so it can get scheduled.

## 2021-11-29 NOTE — Progress Notes (Signed)
The patient can come to [facility] for lab work on 12/12/21 after her 1 PM wound care appointment

## 2021-11-29 NOTE — Progress Notes (Signed)
DEYA, BIGOS ([id number]) Visit Report for 11/28/2021 Arrival Information Details Patient Name: [name], [name]. Date of Service: 11/28/2021 10:30 AM Medical Record Number: [id number] Patient Account Number: [id number] Date of Birth/Sex: 1955-11-12 (66 y.o. F) Treating [name]: [name] Primary Care [name]: [name] Other Clinician: Cornell [name] Referring [name]: [name] Treating [name]/Extender: [name] in Treatment: 17 Visit Information History Since Last Visit Added or deleted any medications: Yes Patient Arrived: Walker Has Dressing in Place as Prescribed: Yes Arrival Time: 10:38 Pain Present Now: No Accompanied By: self Transfer Assistance: None Patient Identification Verified: Yes Secondary Verification Process Completed: Yes Electronic Signature(s) Signed: 11/29/2021 4:54:43 PM By: [name] Entered By: [name] on 11/28/2021 10:39:18 [name], [name] ([id number]) -------------------------------------------------------------------------------- Clinic Level of Care Assessment Details Patient Name: [name]. Date of Service: 11/28/2021 10:30 AM Medical Record Number: [id number] Patient Account Number: [id number] Date of Birth/Sex: Oct 30, 1955 (66 y.o. F) Treating [name]: [name] Primary Care [name]: [name] Other Clinician: Cornell [name] Referring [name]: [name] Treating [name]/Extender: [name] in Treatment: 17 Clinic Level of Care Assessment Items TOOL 4 Quantity Score '[]'$  - Use when only an EandM is performed on FOLLOW-UP visit 0 ASSESSMENTS - Nursing Assessment / Reassessment X - Reassessment of Co-morbidities (includes updates in patient status) 1 10 X- 1 5 Reassessment of Adherence to Treatment Plan ASSESSMENTS - Wound and Skin Assessment / Reassessment X - Simple Wound Assessment / Reassessment - one wound 1 5 '[]'$  - 0 Complex Wound Assessment / Reassessment - multiple wounds '[]'$  -  0 Dermatologic / Skin Assessment (not related to wound area) ASSESSMENTS - Focused Assessment X - Circumferential Edema Measurements - multi extremities 1 5 '[]'$  - 0 Nutritional Assessment / Counseling / Intervention X- 1 5 Lower Extremity Assessment (monofilament, tuning fork, pulses) '[]'$  - 0 Peripheral Arterial Disease Assessment (using hand held doppler) ASSESSMENTS - Ostomy and/or Continence Assessment and Care '[]'$  - Incontinence Assessment and Management 0 '[]'$  - 0 Ostomy Care Assessment and Management (repouching, etc.) PROCESS - Coordination of Care X - Simple Patient / Family Education for ongoing care 1 15 '[]'$  - 0 Complex (extensive) Patient / Family Education for ongoing care X- 1 10 Staff obtains Consents, Records, Test Results / Process Orders '[]'$  - 0 Staff telephones HHA, Nursing Homes / Clarify orders / etc '[]'$  - 0 Routine Transfer to another Facility (non-emergent condition) '[]'$  - 0 Routine Hospital Admission (non-emergent condition) '[]'$  - 0 New Admissions / Biomedical engineer / Ordering NPWT, Apligraf, etc. '[]'$  - 0 Emergency Hospital Admission (emergent condition) X- 1 10 Simple Discharge Coordination '[]'$  - 0 Complex (extensive) Discharge Coordination PROCESS - Special Needs '[]'$  - Pediatric / Minor Patient Management 0 '[]'$  - 0 Isolation Patient Management '[]'$  - 0 Hearing / Language / Visual special needs '[]'$  - 0 Assessment of Community assistance (transportation, D/C planning, etc.) '[]'$  - 0 Additional assistance / Altered mentation '[]'$  - 0 Support Surface(s) Assessment (bed, cushion, seat, etc.) INTERVENTIONS - Wound Cleansing / Measurement [name], [name] ([id number]) X- 1 5 Simple Wound Cleansing - one wound '[]'$  - 0 Complex Wound Cleansing - multiple wounds X- 1 5 Wound Imaging (photographs - any number of wounds) '[]'$  - 0 Wound Tracing (instead of photographs) X- 1 5 Simple Wound Measurement - one wound '[]'$  - 0 Complex Wound Measurement - multiple  wounds INTERVENTIONS - Wound Dressings '[]'$  - Small Wound Dressing one or multiple wounds 0 X- 2 15 Medium Wound Dressing  one or multiple wounds '[]'$  - 0 Large Wound Dressing one or multiple wounds '[]'$  - 0 Application of Medications - topical '[]'$  - 0 Application of Medications - injection INTERVENTIONS - Miscellaneous '[]'$  - External ear exam 0 '[]'$  - 0 Specimen Collection (cultures, biopsies, blood, body fluids, etc.) '[]'$  - 0 Specimen(s) / Culture(s) sent or taken to Lab for analysis '[]'$  - 0 Patient Transfer (multiple staff / Civil Service fast streamer / Similar devices) '[]'$  - 0 Simple Staple / Suture removal (25 or less) '[]'$  - 0 Complex Staple / Suture removal (26 or more) '[]'$  - 0 Hypo / Hyperglycemic Management (close monitor of Blood Glucose) '[]'$  - 0 Ankle / Brachial Index (ABI) - do not check if billed separately X- 1 5 Vital Signs Has the patient been seen at the hospital within the last three years: Yes Total Score: 115 Level Of Care: New/Established - Level 3 Electronic Signature(s) Signed: 11/28/2021 11:59:24 AM By: [name], BSN, [name], CWS, [name], BSN Entered By: [name], BSN, [name], CWS, Elizabeth on 11/28/2021 11:57:58 [name], [name] ([id number]) -------------------------------------------------------------------------------- Encounter Discharge Information Details Patient Name: Elizabeth Landau C. Date of Service: 11/28/2021 10:30 AM Medical Record Number: [id number] Patient Account Number: [id number] Date of Birth/Sex: May 31, 1956 (66 y.o. F) Treating [name]: [name] Primary Care [name]: [name] Other Clinician: Cornell [name] Referring [name]: [name] Treating [name]/Extender: [name] in Treatment: 17 Encounter Discharge Information Items Discharge Condition: Stable Ambulatory Status: Cane Discharge Destination: Home Transportation: Private Auto Accompanied By: self Schedule Follow-up Appointment: Yes Clinical Summary of Care: Electronic Signature(s) Signed: 11/28/2021  12:37:49 PM By: [name] Previous Signature: 11/28/2021 11:56:31 AM Version By: [name], BSN, [name], CWS, [name], BSN Entered By: [name] on 11/28/2021 12:37:49 Barro, [name] ([id number]) -------------------------------------------------------------------------------- Lower Extremity Assessment Details Patient Name: [name], [name] Date of Service: 11/28/2021 10:30 AM Medical Record Number: [id number] Patient Account Number: [id number] Date of Birth/Sex: [birth year] (66 y.o. F) Treating [name]: [name] Primary Care Arayah Krouse: [name] Other Clinician: Cornell [name] Referring Tasheem Elms: [name] Treating Saja Bartolini/Extender: Jeri Cos Weeks in Treatment: 17 Edema Assessment Assessed: Shirlyn Goltz: Yes] Patrice Paradise: Yes] Edema: [Left: Yes] [Right: Yes] Calf Left: Right: Point of Measurement: 39 cm From Medial Instep 87.2 cm 91 cm Ankle Left: Right: Point of Measurement: 9 cm From Medial Instep 52 cm 54 cm Vascular Assessment Pulses: Dorsalis Pedis Palpable: [Left:Yes] [Right:Yes] Electronic Signature(s) Signed: 11/29/2021 4:54:43 PM By: [name] Entered By: [name] on 11/28/2021 10:52:30 Lengel, [name] ([id number]) -------------------------------------------------------------------------------- Multi Wound Chart Details Patient Name: [name]. Date of Service: 11/28/2021 10:30 AM Medical Record Number: [id number] Patient Account Number: [id number] Date of Birth/Sex: [birth year] (66 y.o. F) Treating [name]: [name] Primary Care Dung Salinger: [name] Other Clinician: Cornell [name] Referring Cathi Hazan: [name] Treating Keyontay Stolz/Extender: [name] in Treatment: 17 Vital Signs Height(in): 28 Pulse(bpm): 55 Weight(lbs): 948 Blood Pressure(mmHg): 115/71 Body Mass Index(BMI): 65.9 Temperature(F): 97.5 Respiratory Rate(breaths/min): 18 Photos: [N/A:N/A] Wound Location: Left, Medial, Anterior Lower Leg N/A N/A Wounding Event:  Gradually Appeared N/A N/A Primary Etiology: Lymphedema N/A N/A Comorbid History: Cataracts, Chronic sinus N/A N/A problems/congestion, Anemia, Lymphedema, Asthma, Sleep Apnea, Tuberculosis, Angina, Hypertension, Gout, Osteoarthritis Date Acquired: 10/17/2021 N/A N/A Weeks of Treatment: 6 N/A N/A Wound Status: Open N/A N/A Wound Recurrence: No N/A N/A Measurements L x W x D (cm) 2.5x3.7x0.1 N/A N/A Area (cm) : 7.265 N/A N/A Volume (cm) : 0.726 N/A N/A % Reduction in Area: 7.50% N/A N/A %  Reduction in Volume: 7.50% N/A N/A Classification: Full Thickness Without Exposed N/A N/A Support Structures Exudate Amount: Medium N/A N/A Exudate Type: Serosanguineous N/A N/A Exudate Color: red, brown N/A N/A Granulation Amount: Medium (34-66%) N/A N/A Granulation Quality: Red N/A N/A Necrotic Amount: Small (1-33%) N/A N/A Exposed Structures: Fat Layer (Subcutaneous Tissue): N/A N/A Yes Epithelialization: Small (1-33%) N/A N/A Treatment Notes Electronic Signature(s) Signed: 11/29/2021 4:54:43 PM By: [name] Entered By: [name] on 11/28/2021 10:52:43 [name], [name] ([id number]) -------------------------------------------------------------------------------- [place] Details Patient Name: [name]. Date of Service: 11/28/2021 10:30 AM Medical Record Number: [id number] Patient Account Number: [id number] Date of Birth/Sex: June 29, 1956 (66 y.o. F) Treating [name]: [name] Primary Care Breane Grunwald: [name] Other Clinician: Cornell [name] Referring Vann Okerlund: [name] Treating Salene Mohamud/Extender: [name] in Treatment: 17 Active Inactive Wound/Skin Impairment Nursing Diagnoses: Impaired tissue integrity Knowledge deficit related to ulceration/compromised skin integrity Goals: Ulcer/skin breakdown will have a volume reduction of 30% by week 4 Date Initiated: 08/01/2021 Target Resolution Date: 08/29/2021 Goal Status: Active Ulcer/skin  breakdown will have a volume reduction of 50% by week 8 Date Initiated: 08/01/2021 Target Resolution Date: 09/26/2021 Goal Status: Active Ulcer/skin breakdown will have a volume reduction of 80% by week 12 Date Initiated: 08/01/2021 Target Resolution Date: 10/24/2021 Goal Status: Active Ulcer/skin breakdown will heal within 14 weeks Date Initiated: 08/01/2021 Target Resolution Date: 11/07/2021 Goal Status: Active Interventions: Assess patient/caregiver ability to obtain necessary supplies Assess patient/caregiver ability to perform ulcer/skin care regimen upon admission and as needed Assess ulceration(s) every visit Provide education on ulcer and skin care Notes: Electronic Signature(s) Signed: 11/29/2021 4:54:43 PM By: [name] Entered By: [name] on 11/28/2021 10:52:36 [name], [name] ([id number]) -------------------------------------------------------------------------------- Pain Assessment Details Patient Name: [name]. Date of Service: 11/28/2021 10:30 AM Medical Record Number: [id number] Patient Account Number: [id number] Date of Birth/Sex: Dec 27, 1955 (66 y.o. F) Treating [name]: [name] Primary Care Abdirahman Chittum: [name] Other Clinician: Cornell [name] Referring Jacara Benito: [name] Treating Carson Meche/Extender: [name] in Treatment: 17 Active Problems Location of Pain Severity and Description of Pain Patient Has Paino No Site Locations Pain Management and Medication Current Pain Management: Notes Patient denies pain at this time. Electronic Signature(s) Signed: 11/29/2021 4:54:43 PM By: [name] Entered By: [name] on 11/28/2021 10:39:47 [name], [name] ([id number]) -------------------------------------------------------------------------------- Patient/Caregiver Education Details Patient Name: [name] Date of Service: 11/28/2021 10:30 AM Medical Record Number: [id number] Patient Account Number: [id number] Date of  Birth/Gender: Nov 21, 1955 (66 y.o. F) Treating [name]: [name] Primary Care Physician: [name] Other Clinician: Cornell [name] Referring Physician: Ranae [name] Treating Physician/Extender: [name] in Treatment: 17 Education Assessment Education Provided To: Patient Education Topics Provided Venous: Handouts: Other: ace wraps for compression Methods: Demonstration, Explain/Verbal Responses: State content correctly Wound/Skin Impairment: Handouts: Caring for Your Ulcer Methods: Explain/Verbal Responses: State content correctly Electronic Signature(s) Signed: 11/28/2021 4:50:32 PM By: [name] Previous Signature: 11/28/2021 11:59:24 AM Version By: [name], BSN, [name], CWS, [name], BSN Entered By: [name] on 11/28/2021 12:37:21 [name] ([id number]) -------------------------------------------------------------------------------- Wound Assessment Details Patient Name: Elizabeth Landau C. Date of Service: 11/28/2021 10:30 AM Medical Record Number: [id number] Patient Account Number: [id number] Date of Birth/Sex: June 27, 1956 (66 y.o. F) Treating [name]: [name] Primary Care Gerilynn Mccullars: [name] Other Clinician: Cornell [name] Referring Cambell Rickenbach: [name] Treating Karlye Ihrig/Extender: [name] in Treatment: 17 Wound Status Wound Number: 10 Primary Lymphedema Etiology: Wound Location: Left, Medial,  Anterior Lower Leg Wound Open Wounding Event: Gradually Appeared Status: Date Acquired: 10/17/2021 Comorbid Cataracts, Chronic sinus problems/congestion, Anemia, Weeks Of Treatment: 6 History: Lymphedema, Asthma, Sleep Apnea, Tuberculosis, Angina, Clustered Wound: No Hypertension, Gout, Osteoarthritis Photos Wound Measurements Length: (cm) 2.5 Width: (cm) 3.7 Depth: (cm) 0.1 Area: (cm) 7.265 Volume: (cm) 0.726 % Reduction in Area: 7.5% % Reduction in Volume: 7.5% Epithelialization: Small (1-33%) Tunneling: No Undermining:  No Wound Description Classification: Full Thickness Without Exposed Support Structures Exudate Amount: Medium Exudate Type: Serosanguineous Exudate Color: red, brown Foul Odor After Cleansing: No Slough/Fibrino Yes Wound Bed Granulation Amount: Medium (34-66%) Exposed Structure Granulation Quality: Red Fat Layer (Subcutaneous Tissue) Exposed: Yes Necrotic Amount: Small (1-33%) Necrotic Quality: Adherent Slough Treatment Notes Wound #10 (Lower Leg) Wound Laterality: Left, Medial, Anterior Cleanser Normal Saline Discharge Instruction: Wash your hands with soap and water. Remove old dressing, discard into plastic bag and place into trash. Cleanse the wound with Normal Saline prior to applying a clean dressing using gauze sponges, not tissues or cotton balls. Do not scrub or use excessive force. Pat dry using gauze sponges, not tissue or cotton balls. Soap and Water Discharge Instruction: Gently cleanse wound with antibacterial soap, rinse and pat dry prior to dressing wounds VIRGIA, KELNER. ([id number]) Peri-Wound Care Topical Primary Dressing Xeroform 5x9-HBD (in/in) Discharge Instruction: Apply Xeroform 5x9-HBD (in/in) as directed Secondary Dressing ABD Pad 5x9 (in/in) Discharge Instruction: Cover with ABD pad Kerlix 4.5 x 4.1 (in/yd) Discharge Instruction: Apply Kerlix 4.5 x 4.1 (in/yd) as instructed Secured With ACE WRAP - 60M ACE Elastic Bandage With VELCRO Brand Closure, 4 (in) Medipore Tape - 60M Medipore H Soft Cloth Surgical Tape, 2x2 (in/yd) chucks pad for drainage PRN Compression Wrap Compression Stockings Add-Ons Electronic Signature(s) Signed: 11/29/2021 4:54:43 PM By: [name] Entered By: [name] on 11/28/2021 10:48:22 [name], [name] ([id number]) -------------------------------------------------------------------------------- Vitals Details Patient Name: [name]. Date of Service: 11/28/2021 10:30 AM Medical Record Number: [id number] Patient  Account Number: [id number] Date of Birth/Sex: Dec 20, 1955 (66 y.o. F) Treating [name]: [name] Primary Care Jaskaran Dauzat: [name] Other Clinician: Cornell [name] Referring Octavius Shin: [name] Treating Jerrett Baldinger/Extender: [name] in Treatment: 17 Vital Signs Time Taken: 10:42 Temperature (F): 97.5 Height (in): 63 Pulse (bpm): 67 Weight (lbs): 372 Respiratory Rate (breaths/min): 18 Body Mass Index (BMI): 65.9 Blood Pressure (mmHg): 115/71 Reference Range: 80 - 120 mg / dl Electronic Signature(s) Signed: 11/29/2021 4:54:43 PM By: [name] Entered By: [name] on 11/28/2021 10:42:57

## 2021-12-05 ENCOUNTER — Encounter: Payer: Medicare HMO | Admitting: Physician Assistant

## 2021-12-05 DIAGNOSIS — E11621 Type 2 diabetes mellitus with foot ulcer: Secondary | ICD-10-CM | POA: Diagnosis not present

## 2021-12-05 NOTE — Progress Notes (Addendum)
[name], [name] ([id number]) Visit Report for 12/05/2021 Arrival Information Details Patient Name: [name], [name]. Date of Service: 12/05/2021 10:30 AM Medical Record Number: [id number] Patient Account Number: [id number] Date of Birth/Sex: 1955/08/26 (66 y.o. F) Treating RN: Cornell Barman Primary Care Janicia Monterrosa: Ranae Plumber Other Clinician: Massie Kluver Referring Tessla Spurling: Ranae Plumber Treating Ilma Achee/Extender: Skipper Cliche in Treatment: 18 Visit Information History Since Last Visit Added or deleted any medications: Yes Patient Arrived: Ambulatory Any new allergies or adverse reactions: No Arrival Time: 10:42 Had a fall or experienced change in No Transfer Assistance: Other activities of daily living that may affect risk of falls: Hospitalized since last visit: No Pain Present Now: No Electronic Signature(s) Signed: 12/05/2021 2:38:02 PM By: Massie Kluver Entered By: Massie Kluver on 12/05/2021 10:43:21 Matusek, [name] ([id number]) -------------------------------------------------------------------------------- Clinic Level of Care Assessment Details Patient Name: [name]. Date of Service: 12/05/2021 10:30 AM Medical Record Number: [id number] Patient Account Number: [id number] Date of Birth/Sex: 01/03/1956 (66 y.o. F) Treating RN: Cornell Barman Primary Care Talin Rozeboom: Ranae Plumber Other Clinician: Massie Kluver Referring Braelee Herrle: Ranae Plumber Treating Cate Oravec/Extender: Skipper Cliche in Treatment: 18 Clinic Level of Care Assessment Items TOOL 1 Quantity Score '[]'$  - Use when EandM and Procedure is performed on INITIAL visit 0 ASSESSMENTS - Nursing Assessment / Reassessment '[]'$  - General Physical Exam (combine w/ comprehensive assessment (listed just below) when performed on new 0 pt. evals) '[]'$  - 0 Comprehensive Assessment (HX, ROS, Risk Assessments, Wounds Hx, etc.) ASSESSMENTS - Wound and Skin Assessment / Reassessment '[]'$  - Dermatologic /  Skin Assessment (not related to wound area) 0 ASSESSMENTS - Ostomy and/or Continence Assessment and Care '[]'$  - Incontinence Assessment and Management 0 '[]'$  - 0 Ostomy Care Assessment and Management (repouching, etc.) PROCESS - Coordination of Care '[]'$  - Simple Patient / Family Education for ongoing care 0 '[]'$  - 0 Complex (extensive) Patient / Family Education for ongoing care '[]'$  - 0 Staff obtains Programmer, systems, Records, Test Results / Process Orders '[]'$  - 0 Staff telephones HHA, Nursing Homes / Clarify orders / etc '[]'$  - 0 Routine Transfer to another Facility (non-emergent condition) '[]'$  - 0 Routine Hospital Admission (non-emergent condition) '[]'$  - 0 New Admissions / Biomedical engineer / Ordering NPWT, Apligraf, etc. '[]'$  - 0 Emergency Hospital Admission (emergent condition) PROCESS - Special Needs '[]'$  - Pediatric / Minor Patient Management 0 '[]'$  - 0 Isolation Patient Management '[]'$  - 0 Hearing / Language / Visual special needs '[]'$  - 0 Assessment of Community assistance (transportation, D/C planning, etc.) '[]'$  - 0 Additional assistance / Altered mentation '[]'$  - 0 Support Surface(s) Assessment (bed, cushion, seat, etc.) INTERVENTIONS - Miscellaneous '[]'$  - External ear exam 0 '[]'$  - 0 Patient Transfer (multiple staff / Civil Service fast streamer / Similar devices) '[]'$  - 0 Simple Staple / Suture removal (25 or less) '[]'$  - 0 Complex Staple / Suture removal (26 or more) '[]'$  - 0 Hypo/Hyperglycemic Management (do not check if billed separately) '[]'$  - 0 Ankle / Brachial Index (ABI) - do not check if billed separately Has the patient been seen at the hospital within the last three years: Yes Total Score: 0 Level Of Care: ____ [name] ([id number]) Electronic Signature(s) Signed: 12/05/2021 2:38:02 PM By: Massie Kluver Entered By: Massie Kluver on 12/05/2021 11:40:09 [name], [name] ([id number]) -------------------------------------------------------------------------------- Encounter Discharge  Information Details Patient Name: [name]. Date of Service: 12/05/2021 10:30 AM Medical Record Number: [id number] Patient Account Number: [id number] Date of Birth/Sex: 18-Dec-1955 (66 y.o.  F) Treating RN: Cornell Barman Primary Care Mckynzie Liwanag: Ranae Plumber Other Clinician: Massie Kluver Referring Cherolyn Behrle: Ranae Plumber Treating Nicoli Nardozzi/Extender: Skipper Cliche in Treatment: 18 Encounter Discharge Information Items Post Procedure Vitals Discharge Condition: Stable Temperature (F): 98.2 Ambulatory Status: Ambulatory Pulse (bpm): 75 Discharge Destination: Home Respiratory Rate (breaths/min): 16 Transportation: Private Auto Blood Pressure (mmHg): 103/65 Schedule Follow-up Appointment: Yes Clinical Summary of Care: Electronic Signature(s) Signed: 12/05/2021 2:38:02 PM By: Massie Kluver Entered By: Massie Kluver on 12/05/2021 11:42:10 [name], [name] ([id number]) -------------------------------------------------------------------------------- Lower Extremity Assessment Details Patient Name: [name]. Date of Service: 12/05/2021 10:30 AM Medical Record Number: [id number] Patient Account Number: [id number] Date of Birth/Sex: [birth year] (66 y.o. F) Treating RN: Cornell Barman Primary Care Cambre Matson: Ranae Plumber Other Clinician: Massie Kluver Referring Ceili Boshers: Ranae Plumber Treating Mitsuye Schrodt/Extender: Skipper Cliche in Treatment: 18 Edema Assessment Assessed: [Left: Yes] [Right: Yes] Edema: [Left: Yes] [Right: Yes] Calf Left: Right: Point of Measurement: From Medial Instep 90 cm 91 cm Ankle Left: Right: Point of Measurement: From Medial Instep 48.8 cm 56.4 cm Vascular Assessment Pulses: Dorsalis Pedis Palpable: [Right:Yes] Electronic Signature(s) Signed: 12/05/2021 1:58:17 PM By: Gretta Cool, BSN, RN, CWS, Kim RN, BSN Signed: 12/05/2021 2:38:02 PM By: Massie Kluver Entered By: Massie Kluver on 12/05/2021 11:15:45 [name], [name]  ([id number]) -------------------------------------------------------------------------------- Multi Wound Chart Details Patient Name: [name], [name] Date of Service: 12/05/2021 10:30 AM Medical Record Number: [id number] Patient Account Number: [id number] Date of Birth/Sex: 1956/05/19 (66 y.o. F) Treating RN: Cornell Barman Primary Care Cashae Weich: Ranae Plumber Other Clinician: Massie Kluver Referring Arletta Lumadue: Ranae Plumber Treating Juddson Cobern/Extender: Skipper Cliche in Treatment: 18 Vital Signs Height(in): 39 Pulse(bpm): 60 Weight(lbs): 419 Blood Pressure(mmHg): 103/65 Body Mass Index(BMI): 65.9 Temperature(F): 98.2 Respiratory Rate(breaths/min): 18 Photos: [N/A:N/A] Wound Location: Left, Medial, Anterior Lower Leg N/A N/A Wounding Event: Gradually Appeared N/A N/A Primary Etiology: Lymphedema N/A N/A Comorbid History: Cataracts, Chronic sinus N/A N/A problems/congestion, Anemia, Lymphedema, Asthma, Sleep Apnea, Tuberculosis, Angina, Hypertension, Gout, Osteoarthritis Date Acquired: 10/17/2021 N/A N/A Weeks of Treatment: 7 N/A N/A Wound Status: Open N/A N/A Wound Recurrence: No N/A N/A Measurements L x W x D (cm) 2.5x4x0.1 N/A N/A Area (cm) : 7.854 N/A N/A Volume (cm) : 0.785 N/A N/A % Reduction in Area: 0.00% N/A N/A % Reduction in Volume: 0.00% N/A N/A Classification: Full Thickness Without Exposed N/A N/A Support Structures Exudate Amount: Medium N/A N/A Exudate Type: Serosanguineous N/A N/A Exudate Color: red, brown N/A N/A Granulation Amount: Medium (34-66%) N/A N/A Granulation Quality: Red N/A N/A Necrotic Amount: Small (1-33%) N/A N/A Exposed Structures: Fat Layer (Subcutaneous Tissue): N/A N/A Yes Epithelialization: Small (1-33%) N/A N/A Treatment Notes Electronic Signature(s) Signed: 12/05/2021 2:38:02 PM By: Massie Kluver Entered By: Massie Kluver on 12/05/2021 11:15:59 Blatz, [name]  ([id number]) -------------------------------------------------------------------------------- Multi-Disciplinary Care Plan Details Patient Name: [name]. Date of Service: 12/05/2021 10:30 AM Medical Record Number: [id number] Patient Account Number: [id number] Date of Birth/Sex: February 23, 1956 (66 y.o. F) Treating RN: Cornell Barman Primary Care Pari Lombard: Ranae Plumber Other Clinician: Massie Kluver Referring Uliana Brinker: Ranae Plumber Treating Jediah Horger/Extender: Skipper Cliche in Treatment: 18 Active Inactive Wound/Skin Impairment Nursing Diagnoses: Impaired tissue integrity Knowledge deficit related to ulceration/compromised skin integrity Goals: Ulcer/skin breakdown will have a volume reduction of 30% by week 4 Date Initiated: 08/01/2021 Target Resolution Date: 08/29/2021 Goal Status: Active Ulcer/skin breakdown will have a volume reduction of 50% by week 8 Date Initiated: 08/01/2021 Target Resolution Date: 09/26/2021 Goal Status: Active Ulcer/skin breakdown  will have a volume reduction of 80% by week 12 Date Initiated: 08/01/2021 Target Resolution Date: 10/24/2021 Goal Status: Active Ulcer/skin breakdown will heal within 14 weeks Date Initiated: 08/01/2021 Target Resolution Date: 11/07/2021 Goal Status: Active Interventions: Assess patient/caregiver ability to obtain necessary supplies Assess patient/caregiver ability to perform ulcer/skin care regimen upon admission and as needed Assess ulceration(s) every visit Provide education on ulcer and skin care Notes: Electronic Signature(s) Signed: 12/05/2021 1:58:17 PM By: Gretta Cool, BSN, RN, CWS, Kim RN, BSN Signed: 12/05/2021 2:38:02 PM By: Massie Kluver Entered By: Massie Kluver on 12/05/2021 11:15:50 [name], [name] ([id number]) -------------------------------------------------------------------------------- Pain Assessment Details Patient Name: [name], HELLMER C. Date of Service: 12/05/2021 10:30 AM Medical Record Number:  [id number] Patient Account Number: [id number] Date of Birth/Sex: 1956-03-26 (66 y.o. F) Treating RN: Cornell Barman Primary Care Kenney Going: Ranae Plumber Other Clinician: Massie Kluver Referring Django Nguyen: Ranae Plumber Treating Mizraim Harmening/Extender: Skipper Cliche in Treatment: 18 Active Problems Location of Pain Severity and Description of Pain Patient Has Paino No Site Locations Pain Management and Medication Current Pain Management: Electronic Signature(s) Signed: 12/05/2021 1:58:17 PM By: Gretta Cool, BSN, RN, CWS, Kim RN, BSN Signed: 12/05/2021 2:38:02 PM By: Massie Kluver Entered By: Massie Kluver on 12/05/2021 10:57:31 [name], [name] ([id number]) -------------------------------------------------------------------------------- Patient/Caregiver Education Details Patient Name: [name] Date of Service: 12/05/2021 10:30 AM Medical Record Number: [id number] Patient Account Number: [id number] Date of Birth/Gender: 08-05-1955 (66 y.o. F) Treating RN: Cornell Barman Primary Care Physician: Ranae Plumber Other Clinician: Massie Kluver Referring Physician: Ranae Plumber Treating Physician/Extender: Skipper Cliche in Treatment: 18 Education Assessment Education Provided To: Patient Education Topics Provided Wound/Skin Impairment: Handouts: Other: continue with wound care as directed Electronic Signature(s) Signed: 12/05/2021 2:38:02 PM By: Massie Kluver Entered By: Massie Kluver on 12/05/2021 11:40:56 [name], [name] ([id number]) -------------------------------------------------------------------------------- Wound Assessment Details Patient Name: Elizabeth Landau C. Date of Service: 12/05/2021 10:30 AM Medical Record Number: [id number] Patient Account Number: [id number] Date of Birth/Sex: [birth year] (66 y.o. F) Treating RN: Cornell Barman Primary Care Hannah Strader: Ranae Plumber Other Clinician: Massie Kluver Referring Jaymond Waage: Ranae Plumber Treating  Querida Beretta/Extender: Skipper Cliche in Treatment: 18 Wound Status Wound Number: 10 Primary Lymphedema Etiology: Wound Location: Left, Medial, Anterior Lower Leg Wound Open Wounding Event: Gradually Appeared Status: Date Acquired: 10/17/2021 Comorbid Cataracts, Chronic sinus problems/congestion, Anemia, Weeks Of Treatment: 7 History: Lymphedema, Asthma, Sleep Apnea, Tuberculosis, Angina, Clustered Wound: No Hypertension, Gout, Osteoarthritis Photos Wound Measurements Length: (cm) 2.5 Width: (cm) 4 Depth: (cm) 0.1 Area: (cm) 7.854 Volume: (cm) 0.785 % Reduction in Area: 0% % Reduction in Volume: 0% Epithelialization: Small (1-33%) Tunneling: No Undermining: No Wound Description Classification: Full Thickness Without Exposed Support Structures Exudate Amount: Medium Exudate Type: Serosanguineous Exudate Color: red, brown Foul Odor After Cleansing: No Slough/Fibrino Yes Wound Bed Granulation Amount: Medium (34-66%) Exposed Structure Granulation Quality: Red Fat Layer (Subcutaneous Tissue) Exposed: Yes Necrotic Amount: Small (1-33%) Necrotic Quality: Adherent Slough Treatment Notes Wound #10 (Lower Leg) Wound Laterality: Left, Medial, Anterior Cleanser Normal Saline Discharge Instruction: Wash your hands with soap and water. Remove old dressing, discard into plastic bag and place into trash. Cleanse the wound with Normal Saline prior to applying a clean dressing using gauze sponges, not tissues or cotton balls. Do not scrub or use excessive force. Pat dry using gauze sponges, not tissue or cotton balls. Soap and Water Discharge Instruction: Gently cleanse wound with antibacterial soap, rinse and pat dry prior to dressing wounds Mccadden, Zacaria C. ([id number]) Peri-Wound Care Topical  Primary Dressing Xeroform 5x9-HBD (in/in) Discharge Instruction: Apply Xeroform 5x9-HBD (in/in) as directed Secondary Dressing ABD Pad 5x9 (in/in) Discharge Instruction: Cover with ABD  pad Kerlix 4.5 x 4.1 (in/yd) Discharge Instruction: Apply Kerlix 4.5 x 4.1 (in/yd) as instructed Secured With ACE WRAP - 67M ACE Elastic Bandage With VELCRO Brand Closure, 4 (in) Medipore Tape - 67M Medipore H Soft Cloth Surgical Tape, 2x2 (in/yd) chucks pad for drainage PRN Compression Wrap Compression Stockings Add-Ons Electronic Signature(s) Signed: 12/05/2021 1:58:17 PM By: Gretta Cool, BSN, RN, CWS, Kim RN, BSN Signed: 12/05/2021 2:38:02 PM By: Massie Kluver Entered By: Massie Kluver on 12/05/2021 Banks, [name] ([id number]) -------------------------------------------------------------------------------- [place] Details Patient Name: [name]. Date of Service: 12/05/2021 10:30 AM Medical Record Number: [id number] Patient Account Number: [id number] Date of Birth/Sex: June 03, 1956 (66 y.o. F) Treating RN: Cornell Barman Primary Care Dsean Vantol: Ranae Plumber Other Clinician: Massie Kluver Referring Ezra Marquess: Ranae Plumber Treating Porschea Borys/Extender: Skipper Cliche in Treatment: 18 Vital Signs Time Taken: 10:43 Temperature (F): 98.2 Height (in): 63 Pulse (bpm): 75 Weight (lbs): 372 Respiratory Rate (breaths/min): 18 Body Mass Index (BMI): 65.9 Blood Pressure (mmHg): 103/65 Reference Range: 80 - 120 mg / dl Electronic Signature(s) Signed: 12/05/2021 2:38:02 PM By: Massie Kluver Entered By: Massie Kluver on 12/05/2021 10:57:23

## 2021-12-05 NOTE — Progress Notes (Addendum)
ADEJA, SARRATT ([id number]) Visit Report for 12/05/2021 Chief Complaint Document Details Patient Name: [name], [name]. Date of Service: 12/05/2021 10:30 AM Medical Record Number: [id number] Patient Account Number: [id number] Date of Birth/Sex: 12-04-1955 (66 y.o. F) Treating RN: Cornell Barman Primary Care Provider: Ranae Plumber Other Clinician: Massie Kluver Referring Provider: Ranae Plumber Treating Provider/Extender: Skipper Cliche in Treatment: 18 Information Obtained from: Patient Chief Complaint Bilateral LE lymphedema with Left LE Ulcers Electronic Signature(s) Signed: 12/05/2021 10:40:23 AM By: Worthy Keeler PA-C Entered By: Worthy Keeler on 12/05/2021 10:40:23 Wendorff, [name] ([id number]) -------------------------------------------------------------------------------- Debridement Details Patient Name: [name] Date of Service: 12/05/2021 10:30 AM Medical Record Number: [id number] Patient Account Number: [id number] Date of Birth/Sex: Mar 07, 1956 (66 y.o. F) Treating RN: Cornell Barman Primary Care Provider: Ranae Plumber Other Clinician: Massie Kluver Referring Provider: Ranae Plumber Treating Provider/Extender: Skipper Cliche in Treatment: 18 Debridement Performed for Wound #10 Left,Medial,Anterior Lower Leg Assessment: Performed By: Physician Tommie Sams., PA-C Debridement Type: Debridement Level of Consciousness (Pre- Awake and Alert procedure): Pre-procedure Verification/Time Out Yes - 11:20 Taken: Start Time: 11:20 Total Area Debrided (L x W): 1 (cm) x 1 (cm) = 1 (cm) Tissue and other material Viable, Non-Viable, Slough, Subcutaneous, Biofilm, Slough debrided: Level: Skin/Subcutaneous Tissue Debridement Description: Excisional Instrument: Curette Bleeding: Minimum Hemostasis Achieved: Pressure End Time: 11:23 Response to Treatment: Procedure was tolerated well Level of Consciousness (Post- Awake and Alert procedure): Post  Debridement Measurements of Total Wound Length: (cm) 2.5 Width: (cm) 4 Depth: (cm) 0.1 Volume: (cm) 0.785 Character of Wound/Ulcer Post Debridement: Stable Post Procedure Diagnosis Same as Pre-procedure Electronic Signature(s) Signed: 12/05/2021 12:09:48 PM By: Worthy Keeler PA-C Signed: 12/05/2021 1:58:17 PM By: Gretta Cool, BSN, RN, CWS, Kim RN, BSN Signed: 12/05/2021 2:38:02 PM By: Massie Kluver Entered By: Massie Kluver on 12/05/2021 11:24:03 Mikrut, [name] ([id number]) -------------------------------------------------------------------------------- HPI Details Patient Name: [name], [name] Date of Service: 12/05/2021 10:30 AM Medical Record Number: [id number] Patient Account Number: [id number] Date of Birth/Sex: 1955/11/19 (66 y.o. F) Treating RN: Cornell Barman Primary Care Provider: Ranae Plumber Other Clinician: Massie Kluver Referring Provider: Ranae Plumber Treating Provider/Extender: Skipper Cliche in Treatment: 18 History of Present Illness HPI Description: The patient is a 66 year old female with history of hypertension and a long-standing history of bilateral lower extremity lymphedema (first presented on 4/2) . She has had open ulcers in the past which have always responded to compression therapy. She had briefly been to a lymphedema clinic in the past which helped her at the time. this time around she stopped treatment of her lymphedema pumps approximately 2 weeks ago because of some pain in the knees and then noticed the right leg getting worse. She was seen by her PCP who put her on clindamycin 4 times a day 2 days ago. The patient has seen [facility] and Dr. Delana Meyer had seen her last year where a vascular study including venous and arterial duplex studies were within normal limits. he had recommended compression stockings and lymphedema pumps and the patient has been using this in about 2 weeks ago. She is known to be diabetic but in the past few time she's gone to  her primary care doctor her hemoglobin A1c has been normal. 02/11/2015 - after her last visit she took my advice and went to the ER regarding the progressive cellulitis of her right lower extremity and she was admitted between July 17 and 22nd. She received IV antibiotics and then was  sent home on a course of steroid-induced and oral antibiotics. She has improved much since then. 02/17/2015 -- she has been doing fine and the weeping of her legs has remarkably gone down. She has no fresh issues. READMISSION 01/15/18 This patient was given this clinic before most recently in 2016 seen by Dr. Con Memos. She has massive bilateral lymphedema and over the last 2 months this had weeping edema out of the left leg. She has compression pumps but her compliance with these has been minimal. She has [facility] they've been using TCA/ABDs/kerlix under an Ace wrap.she has had recent problems with cellulitis. She was apparently seen in the ER and 12/23/17 and given clindamycin. She was then followed by her primary doctor and given doxycycline and Keflex. The pain seems to have settled down. In April 2018 the patient had arterial studies done at  [facility]. This showed triphasic waveforms throughout the right leg and mostly triphasic waveforms on the left except for monophasic at the posterior tibial artery distally. She was not felt to have evidence of right lower extremity arterial stenosis or significant problems on the left side. 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 She also had a right lower extremity venous Doppler in January 2018 this was limited by the patient's body habitus and lymphedema. Most of the proximal veins were not visualized The patient presents with an area of denuded skin on the anterior medial part of the left calf. There is weeping edema fluid here. 01/22/18; the patient has somewhat better edema control using her compression pumps twice a day  and as a result she has much better epithelialization on the left anterior calf area. Only a small open area remains. 01/29/18; the patient has been compliant with her compression pumps. Both the areas on her calf that healed. The remaining area on the left anterior leg is fully epithelialized Readmission: 02/20/2019 upon evaluation today patient presents for reevaluation due to issues that she is having with the bilateral lower extremities. She actually has wounds open on both legs. On the right she has an area in the crease of her leg on the right around the knee region which is actually draining quite a bit and actually has some fungal type appearance to it. She has been on nystatin powder that seems to have helped to some degree. In regard to the left lower extremity this is actually in the lower portion of her leg closer to the ankle and again is continuing to drain as well unfortunately. There does not appear to be any signs of active infection at this time which is good news. No fevers, chills, nausea, vomiting, or diarrhea. She tells me that since she was seen last year she is actually been doing quite well for the most part with regard to her lower extremities. Unfortunately she now is experiencing a little bit more drainage at this time. She is concerned about getting this under control so that it does not get significantly worse. 02/27/2019 on evaluation today patient appears to be doing somewhat better in regard to her bilateral lower extremity wounds. She has been tolerating the dressing changes without complication. Fortunately there is no signs of active infection at this point. No fevers, chills, nausea, vomiting, or diarrhea. She did get her dressing supplies which is excellent news she was extremely excited to get these. She also got paperwork from [employer] for their financial assistance program where they may be able to help her out in  the future if needed with supplies at  discounted prices. 03/06/2019 on evaluation today patient appears to be doing a little worse with regard to both areas of weeping on her bilateral lower extremities. This is around the right medial knee and just above the left ankle. With that being said she is unfortunately not doing as well as I would like to see. I feel like she may need to potentially go see someone at the lymphedema clinic as the wraps that she needs or even beyond what we can do here at the wound care center. She really does not have wounds she just has open areas of weeping that are causing some difficulty for her. Subsequently because of this and the moisture I am concerned about the potential for infection I am going to likely give her a prophylactic antibiotic today, Keflex, just to be on the safe side. Nonetheless again there is no obvious signs of active infection at this time. 03/13/2019 on evaluation today patient appears to be doing well with regard to her bilateral lower extremities where she has been weeping compared to even last week's evaluation. I see some areas of new skin growth which is excellent and overall I am very pleased with how things seem to be progressing. No fevers, chills, nausea, vomiting, or diarrhea. GRADY, LUCCI ([id number]) 03/20/2019 on evaluation today patient unfortunately is continuing to have issues with significant edema of the left lower extremity. Her right side seems to be doing much better. Unfortunately her left side is showing increased weeping of the lower portion of her leg. This is quite unfortunate obviously we were hoping to get her into the lymphedema clinic they really do not seem to when I see her how if she is draining. Despite the fact this is really not wound related but more lymphedema weeping related. Nonetheless I do not know that this can be helpful for her to even go for that appointment since again I am not sure there is much that they would actually do at this  point. We may need to try a 4 layer compression wrap as best we can on her leg. She is on the Augmentin currently although I am still concerned about whether or not there could be potentially something going on infection wise I would obtain a culture though I understand is not the best being that is a surface culture I just 1 to make sure I do not seem to be missing anything. 03/27/2019 on evaluation today patient appears to be doing much better in regard to the left lower extremity compared to last week. Last week she had tremendous weeping which I think was subsequent to infection now she seems to be doing much better and very pleased. This is not completely healed but there is a lot of new skin growth and it has dried out quite a bit. Overall I think that we are doing well with how things are moving along at this time. No fevers, chills, nausea, vomiting, or diarrhea. 04/03/2019 evaluation today patient appears to be doing a little worse this week compared to last time I saw her. I think this may be due to the fact that she is having issues with not being able to sleep in her bed at least not until last night. She is therefore been in a lift chair and subsequently has also had issues with not been able to use her pumps since she could not get in bed. With that being said the patient  overall seems to be doing okay I do think I may want extend the antibiotic for a little bit longer at least until we can see if her edema and her weeping gets better and if it is then obviously I can always discontinue the antibiotics as of next week however I want her to continue to have it over the next week. 04/10/2019 on evaluation today patient unfortunately is still doing poorly with regard to her left lower extremity. Her right is all things considering doing fairly well. On the left however she continues to have spreading of the area of infection and weeping which appears to be even a larger surface area than  noted last week. She did have a positive culture for Pseudomonas in particular which seems to have been of concern she still has green/yellow discharge consistent with Pseudomonas and subsequently a tremendous amount of it. This has me obviously still concerned about the infection not really clearing up despite the fact that on culture it appears the Cipro should have been a good option for treating this. I think she may at this point need IV antibiotics since things are not doing better I do not want to get worse and cause sepsis. She is in agreement with the plan and believes as well that she likely does need to go to the hospital for IV vancomycin. Or something of the like depending on what the recommendation is from the ER. 04/17/2019 on evaluation today patient appears to be doing excellent in regard to her lower extremity on the left. She was in the hospital for several days from when I sent her last we saw her until just this past Tuesday. Fortunately her drainage is significantly improved and in fact is mostly clear. There is just a couple small areas that may still drain a little bit she states that the [facility] they prescribed for her at discharge she went picked up from pharmacy and got home but has not been able to find it since. She is looked everywhere. She is wondering if I will replace that for her today I will be more than happy to do that. 05/01/2019 on evaluation today patient actually appears to be doing quite well with regard to her lower extremities. She occasionally is having areas that will leak and then heal up mainly when a piece of the fibrotic skin pops off but fortunately she is not having any signs of active infection at this time. Overall she also really does not have any obvious weeping at this time. I do believe however she really needs some compression wraps and I think this may be a good time to get her back to the lymphedema clinic. 05/11/2019 on evaluation today  patient actually appears to be doing quite well with regard to her bilateral lower extremities. She occasionally will have a small area that we per another but in general seems to be completely healed which is great news. Overall very pleased with how everything seems to be progressing. She does have her appointment with lymphedema clinic on November 18. 05/25/2019 on evaluation today patient appears to be doing well with regard to her left lower extremity. I am very pleased in this regard. In regard to her right leg this actually did start draining more I think it is mainly due to the fact that her leg is more swollen. I am not seeing any obvious signs of infection at this time although that is definitely something were obviously acutely aware of simply due  to the fact that she had an issue not too far back with exactly this issue. Nonetheless I do feel like that lymphedema clinic would still be beneficial for her. I explained obviously if they are not able to do anything treatment wise on the right leg we could at least have them treat her left leg and then proceed from there. The patient is really in agreement with that plan. If they are able to do both as the drainage slows down that I would be happy to let them handle both. 06/01/2019 on evaluation today patient unfortunately appears to be doing worse with regard to her right lower extremity. The left lower extremity is still maintaining at this point. Unfortunately she has been having significantly increased pain over the past several days and has been experiencing as well increased swelling of the right lower extremity. I really do not know that I am seeing anything that appears to be obvious for infection at this point to be peripherally honest. With that being said the patient does seem to be having much more swelling that she is even experienced in the past and coupled with increased pain in her hip as well I am concerned that again she could  potentially have a DVT although I am not 100% sure of this. I think it something that may need to be checked out. We discussed the possibility of sending her for a DVT study through the hospital but unfortunately transportation is an issue if she does have a DVT I do not want her to wait days to be able to get in for that test however if she has this scheduled as an outpatient that is as fast that she will be able to get the test scheduled for transportation purposes. That will also fall on Thanksgiving so subsequently she did actually be looking at either Friday or even next week before we would know anything back from this. That is much too long in my opinion. Subsequent to the amount of discomfort she is experiencing the patient is actually okay with going to the ER for evaluation today. 06/12/2019 on evaluation today patient actually appears to be doing significantly better compared to last time I saw her. Following when I last saw her she was actually in the hospital from that Monday until the following Sunday almost 1 full week. She actually was placed on Keflex in the hospital following the time for her to be discharged and Dr. Steva Ready has recommended 2 times a day dosing of the Keflex for the next year in order to help with more prophylactic/preventative measures with regard to her developing cellulitis. Overall I think this sounds like an excellent plan. The patient unfortunately is good to have trouble being treated at lymphedema clinic due to the fact that she really cannot get up on the bed that they have there. They also state that they cannot manage her as long as she has anything draining at this point. Obviously that is somewhat unfortunate as she does need help with edema control but nonetheless we will have to do what we can for her outside of it sounds like the lymphedema clinic scenario at this point. 06/19/2019 on evaluation today patient appears to be doing fairly well with  regard to her bilateral lower extremities. She is not nearly as swollen and shows no signs of infection at this point. There is no evidence of cellulitis whatsoever. She also has no open wounds or draining at this point which is also  good news. No fever chills noted. She seems to be in very good spirits and in fact appears to be doing quite well. READMISSION 11/27/2019 AVALIE, OCONNOR ([id number]) This is a 66 year old woman that we have had in this clinic several times before including 2015, 16 and 19 and then most recently from 03/20/2019 through 06/19/2019 with bilateral lower extremity lymphedema. She has had previous arterial and reflux studies done years ago which were not all that remarkable. 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 She does have a positive family history and she had large legs starting may be in her 40s. She was recently in hospital from 10/20/2019 through 10/28/2019 with right leg cellulitis. She was given Ancef and clindamycin and then Zosyn when a culture showed Pseudomonas. At that time there was purulent drainage. She was followed by infectious disease Dr Steva Ready. The patient is now back at home. She has noted increased swelling in the right and no drainage in her right leg mostly on the posterior medial aspect in the calf area. She has not had pain or fever. She has literally been improved lysing above dressings because her at the area of this is far too large for standard compression. She has been wrapping the areas with sheets to resorptive pads. She is found these helped somewhat. She does have an appointment with the lymphedema clinic in [place] in late June. Past medical history includes bilateral lymphedema, hypertension, obstructive sleep apnea with CPAP. Recent hospitalization with apparently Pseudomonas cellulitis of the right lower leg 12/15/2019 upon evaluation today patient appears to be doing a  little bit worse in regard to her right lower extremity. Unfortunately she is having more weeping down in the lower portion of her leg. Fortunately there is no signs of active infection at this time. No fever chills noted. The patient states she is not having increased pain except for when she attempted to use the lymphedema pumps unfortunately she states that she did have pain when she did this. Otherwise we been using absorptive dressings of one type or another she is using diapers at home and then subsequently Ace wraps. In regard to the barrier cream we have discussed the possibility of derma cloud which she would like to try I do not have a problem with that. 12/22/2019 upon evaluation today patient actually appears to be doing better in regard to her leg ulcers at this point. Fortunately there does not appear to be any signs of active infection which is great news and I am extremely pleased with where things are progressing at this time. There is no sign of active infection currently. The patient is very pleased to see things doing so well. 12/29/2019 upon evaluation today patient appears to be doing a little bit better in regard to her weeping in general over her lower extremities. She does have some signs of mild erythema little bit more than what I noted last week or rather last visit. Nonetheless I think that my threshold for switching her antibiotics from Keflex to something else is very low at this point considering that she has had such severe infections in the past that seem to come almost out of nowhere. There is a little erythema and warmth noted of the lower portion of her leg compared to the upper which also makes me want to go ahead and address things more rapidly at this point. Likely I would switch out the Keflex for something like Levaquin  ideally. 7/16; patient with severe bilateral lymphedema. She has superficial wounds albeit almost circumferential now on the left lateral lower  leg. This may be new from last time. Small area on the right anterior lower leg and then another area on the right medial lower leg and of pannus fold. She has been using various absorptive garments. She states she is using her compression pumps once a day occasionally twice. Culture from her last visit here was negative 01/29/2020 on evaluation today patient appears to be doing excellent at this point in regard to her legs with regard to infection I see no signs of active infection at this point. She still does have unfortunately areas of weeping this is minimal on the right now her left is actually significantly worse although I do not think it is as bad as last week with Dr. Dellia Nims saw her. She has been trying to pump and elevate her legs is much as possible. She has previously been on the Keflex and in the past for prevention that seems to do fairly well and likely can extend that today. 02/04/2020 on evaluation today patient appears to be doing better in regard to her legs bilaterally. Fortunately there is no signs of active infection at this time which is great news and overall she has less weeping on the left compared to the right and there is several spots where she is pretty much sealed up with no draining regions. Overall very pleased in this regard. 02/19/2020 on evaluation today patient appears to be doing very well in regard to her wounds currently. Fortunately there is no evidence of active infection overall very pleased with where things stand. She is significantly improved in regard to her edema I am extremely pleased in this regard she tells me that the popping no longer hurts and in fact she actually looks forward to it. 03/04/2020 on evaluation today patient appears to be doing excellent in regard to her lower extremities. Fortunately there is no signs of active infection at this time. No fevers, chills, nausea, vomiting, or diarrhea. 03/25/2020 on evaluation today patient appears to be  doing a little bit more poorly in regard to her legs at this point. She tells me that she is still continue to have issues with drainage and this has been a little bit worse she was getting ready to start taking the Keflex again but wanted to see me first. Fortunately there is no signs of active infection at this time. No fevers, chills, nausea, vomiting, or diarrhea. 04/15/2020 upon evaluation today patient appears to be doing somewhat poorly in regard to her right leg. She tells me she has been having more pain she has been taking the Keflex that was previously prescribed unfortunately that just does not seem to help with this. She was hoping that the pain on her right was actually coming from the fact that she was having issues with her wrap having gotten caught in her recliner. With that being said she tells me that she knew something was not right. Currently her right leg is warm to touch along with being erythematous all the way up to around at least mid thigh as far as I can see. The left leg does not appear to be doing that badly though there is increased weeping around the ankle region. 05/06/2020 on evaluation today patient appears to be doing much better than last time I saw her. She did go to the hospital where she was admitted for 2 days and  treated with antibiotic therapy. She was discharged with antibiotics as well and has done extremely well. I am extremely pleased with where things stand today. There is no signs of active infection at this time which is great news. 05/27/2020 upon evaluation today patient appears to be doing well with regard to her lower extremities bilaterally. She has just a very tiny area on the right leg which is opening on the left leg she is significantly improved though she still has several areas that do appear to be open this is minimal compared to what is been in the past. In general I am extremely pleased with where things stand today. The patient does tell  me she is not been using her pumps quite as much as she should be. I do believe that is 1 area she can definitely work on. She has had a lot going on including a Covid exposure and apparently also a outbreak of likely shingles. 06/24/2020 upon evaluation today patient appears to be doing well with regard to her legs in general although the left leg unfortunately is showing some signs of erythema she does have a little bit of increased weeping and to be honest I am concerned about infection here. I discussed that with her today and I think that we may need to address this sooner rather than later she has been taking Keflex she is not really certain that is been making a big improvement however. No fevers, chills, nausea, vomiting, or diarrhea. SHAKIERA, EDELSON ([id number]) 06/30/2020 upon evaluation today patient's legs actually seem to be doing better in my opinion as compared to where they were last week. Fortunately there does not appear to be any signs of active infection. Her culture showed multiple organisms nothing predominate. With that being said the Levaquin seems to have done well I think she has improved since I last saw her as well. 07/14/2020 upon evaluation today patient appears to be doing actually better in regard to her lower extremities in my opinion. She has been tolerating the dressing changes without complication. Fortunately there is no signs of active infection at this time. 08/04/2020 upon evaluation today patient appears to be doing excellent in regard to her leg ulcers. Fortunately she has very little that is open at this point. This is great news. In fact I think that the Goldbond medicated powder has been excellent for her. It seems to have done the trick where we had tried several other things without as much success. Fortunately there is no evidence of active infection at this time. No fevers, chills, nausea, vomiting, or diarrhea. 09/01/2020 upon evaluation today patient  appears to be doing a little bit more poorly than the last time I saw her. She tells me right now that she has been having a lot of drainage compared to where things were previous which has unfortunately led to more irritation as well. She is concerned that this is leading to infection. Again based on what I am seeing today as well I am also concerned of the same to be honest. 09/15/2020 upon evaluation today patient appears to be doing well with regard to her legs compared to what I saw previous. Fortunately there does not appear to be any evidence of active infection at this time which is great news. At least not as badly as what it was previous. With that being said I do believe that the patient does need to have an extension of the antibiotics. This I believe will actually help  her more in the way of making sure this stays under control and does not worsen. That was the Bactrim DS. Readmission: 08/01/2021 upon evaluation today patient appears to be doing actually pretty well all things considered. Is actually been almost a year since have seen her last March. Fortunately I do not see any evidence of active infection locally nor systemically at this point. She does have a couple open areas on the left leg the right leg appears to be doing quite well. In general she has been in the hospital 3 times since I saw her a year ago all 4 cellulitis type issues except for the last 1 which was actually more related to congestive heart failure for that reason she is not using her lymphedema pumps at this point and that is probably the best idea. 08/08/2021 upon evaluation today patient appears to be doing well with regard to her legs all things considered her left leg may be a little bit more swollen based on what I see today. Fortunately I do not see any signs of active infection locally nor systemically at this time which is great news. No fevers, chills, nausea, vomiting, or diarrhea. 08/15/2021 upon  evaluation today patient actually appears to be doing excellent in regard to her wounds. She has been tolerating the dressing changes without complication. Fortunately I see no evidence of infection currently which is great news. No fevers, chills, nausea, vomiting, or diarrhea. 08/29/2021 upon evaluation patient appears to be doing excellent she in fact is almost completely healed. I am actually very pleased with where we stand today and I think that she is making wonderful progress she just has a small area of weeping on the right lateral leg everything else is pretty much completely healed which is also. 09/05/2021 upon evaluation today patient appears to be doing well with regard to her legs. She does have a small area of weeping on the right leg and there is a small area on the left leg as well. It is potentially something that may need to be addressed in the future more specifically but right now I think that there may have just been a little drainage here on the left. With all that being said I think that this still does not appear to be infected and overall I feel like she is doing quite well. That something we always have to keep a very close eye on as she can change very rapidly from okay to not okay. 09/12/2021 upon evaluation today patient appears to be doing a little bit worse in regard to her leg and actually somewhat concerned about the possibility of infection here. I discussed that with the patient. For that reason I am going to go ahead and see about getting her set up for a repeat prescription for the Bactrim. She is in agreement with that plan. 3/14; patient presents for follow-up. She has been using silver alginate with dressing changes. She is still taking Bactrim prescribed at last clinic visit. She has no issues or complaints today. She has lymphedema pumps however does not use these. 09/26/2020 upon evaluation today patient appears to actually be doing well in regard to her right  leg I am pleased in that regard. Unfortunately she has new areas on her left leg that are open at this point that I do need to be addressed. I am also concerned about the warmth around one of the wounds on the more anterior side. I think this is something that we  may need to culture today potentially requiring antibiotics going forward. 10/03/2021 upon evaluation today patient unfortunately is not doing nearly as well as she was even last week with her wounds. She is having some issues here with increased cellulitis of the left lower extremity unfortunately. I did prescribe Augmentin for her eczema prescribed last week unfortunately she never actually received this prescription. The pharmacy stated they never got it. We double checked with them today they still did not receive it. I am not sure what happened in that regard. With that being said the patient is in good spirits today she does not appear to be as sick as where she normally is when the cellulitis gets significantly worse as it has in the past. Nonetheless the leg is much more red not just around the ankle where I was seeing at last week on Tuesday but rather now this is going all the way up to almost her knee and is definitely hot to touch compared to the right leg. Nonetheless I feel like she does need to go to the ER for likely IV antibiotics. 10-10-2021 upon evaluation today patient appears to be doing well with regard to her wound. She has been tolerating the dressing changes that appears to be doing much better. After I saw her last week she did go to the ER they admitted her to the hospital and gave her IV antibiotics she fortunately is doing significantly better. Overall I am extremely pleased with where things stand today. 10-17-2021 upon evaluation today patient appears to be doing well with regard to her wound. In fact this is looking better and her leg is looking better but she still does have some areas here of erythema. We will  continue to address this as soon as possible in my opinion. I do believe she may require some debridement and what appears to be a more defined wound on her left leg at this point. 10-24-2021 upon evaluation patient is definitely showing signs of improvement which is great news. I do not see any evidence of active infection locally or systemically which is great news as well. No fevers, chills, nausea, vomiting, or diarrhea. 10-31-2021. Upon evaluation today patient's leg actually feels better from an infection and wound care perspective. I am actually very pleased in this regard. Unfortunately the biggest issue that I see at this time is that the patient is having a significant issue with her breathing. I did put her on the pulse ox machine to check her oxygen saturation and it was pretty much at 100% which is good but she tells me that when she is walking Furniss, Jimmi C. ([id number]) this is much more difficult for her. She also tells me that when she is trying to bend over to perform her dressing changes she is so short of breath due to the swelling and fluid in her abdominal area that she is just not been able to do this. She is tearful and very concerned today to be honest. I am truly sorry to see her this way I know she is really having a hard time at the moment. 11-14-2021 upon evaluation patient actually appears to be doing significantly better compared to when I last saw her. She was actually admitted to the hospital I believe this for 6 days total after I sent her on 10-31-2021. Subsequently they actually pulled off greater than 50 pounds of fluid she tells me she feels significantly better her legs are not as tight she actually has  some play in the tissue and it is obvious that she is doing much better just from a mobility standpoint as well as a breathing standpoint. Overall I am extremely happy for her and how she is doing the leg also looks much better on the left. 11-21-2021 upon evaluation  today patient appears to be doing well although it does appear that she is going require some sharp debridement the wound is very dry this is the opposite of what its been she was having so much weeping that it was staying extremely wet. Nonetheless we do need to see about going ahead and debriding the wound today. 11-28-2021 upon evaluation today patient appears to be doing well with regard to her wound I definitely see signs of things continuing to improve which is great news. I do not see any evidence of active infection locally or systemically also great news. 12-05-2021 upon evaluation today patient appears to be doing well with regard to her wound. She has been tolerating the dressing changes. Fortunately there does not appear to be any signs of active infection locally or systemically at this time. No fevers, chills, nausea, vomiting, or diarrhea. Electronic Signature(s) Signed: 12/05/2021 11:26:43 AM By: Worthy Keeler PA-C Entered By: Worthy Keeler on 12/05/2021 11:26:43 Johndrow, [name] ([id number]) -------------------------------------------------------------------------------- Physical Exam Details Patient Name: KEELIN, NEVILLE C. Date of Service: 12/05/2021 10:30 AM Medical Record Number: [id number] Patient Account Number: [id number] Date of Birth/Sex: 02/21/1956 (66 y.o. F) Treating RN: Cornell Barman Primary Care Provider: Ranae Plumber Other Clinician: Massie Kluver Referring Provider: Ranae Plumber Treating Provider/Extender: Skipper Cliche in Treatment: 33 Constitutional Well-nourished and well-hydrated in no acute distress. Respiratory normal breathing without difficulty. Psychiatric this patient is able to make decisions and demonstrates good insight into disease process. Alert and Oriented x 3. pleasant and cooperative. Notes Upon inspection patient's wound bed did require some sharp debridement to clear away some of the necrotic debris this was a much smaller  area than the full measurement due to the scattered nature and a lot of new skin that is growing in between. With that being said I did clear away the slough and biofilm down to get subcutaneous tissue patient tolerated that without complication. Electronic Signature(s) Signed: 12/05/2021 11:27:03 AM By: Worthy Keeler PA-C Entered By: Worthy Keeler on 12/05/2021 11:27:03 [name] ([id number]) -------------------------------------------------------------------------------- Physician Orders Details Patient Name: [name] Date of Service: 12/05/2021 10:30 AM Medical Record Number: [id number] Patient Account Number: [id number] Date of Birth/Sex: 01/24/1956 (66 y.o. 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 o Nurse Visit as needed Bathing/ Shower/ Hygiene o Clean wound with Normal Saline or wound cleanser. o Wash wounds with antibacterial soap and water. o May shower; gently cleanse wound with antibacterial soap, rinse and pat dry prior to dressing wounds o No tub bath. Edema Control - Lymphedema / Segmental Compressive Device / Other o Ace wraps o Elevate, Exercise Daily and Avoid Standing for Long Periods of Time. o Elevate leg(s) parallel to the floor when sitting. o DO YOUR  BEST to sleep in the bed at night. DO NOT sleep in your recliner. Long hours of sitting in a recliner leads to swelling of the legs and/or potential wounds on your  backside. o Other: - use lymphedema pumps Non-Wound Condition Bilateral Lower Extremities o Cleanse affected area with antibacterial soap and water, o Apply appropriate compression. - ace wrap o Additional non-wound orders/instructions: - silver cell to open weeping areas with abd pad Wound Treatment Wound #10 - Lower Leg Wound Laterality: Left, Medial, Anterior Cleanser: Normal Saline 1 x Per Day/30 Days Discharge Instructions: Wash your hands with soap and water. Remove old dressing, discard into plastic bag and place into trash. Cleanse the wound with Normal Saline prior to applying a clean dressing using gauze sponges, not tissues or cotton balls. Do not scrub or use excessive force. Pat dry using gauze sponges, not tissue or cotton balls. Cleanser: Soap and Water 1 x Per Day/30 Days Discharge Instructions: Gently cleanse wound with antibacterial soap, rinse and pat dry prior to dressing wounds Primary Dressing: Xeroform 5x9-HBD (in/in) 1 x Per Day/30 Days Discharge Instructions: Apply Xeroform 5x9-HBD (in/in) as directed Secondary Dressing: ABD Pad 5x9 (in/in) (Generic) 1 x Per Day/30 Days Discharge Instructions: Cover with ABD pad Secondary Dressing: Kerlix 4.5 x 4.1 (in/yd) (Generic) 1 x Per Day/30 Days Discharge Instructions: Apply Kerlix 4.5 x 4.1 (in/yd) as instructed Secured With: ACE WRAP - 72M ACE Elastic Bandage With VELCRO Brand Closure, 4 (in) 1 x Per Day/30 Days Secured With: Medipore Tape - 72M Medipore H Soft Cloth Surgical Tape, 2x2 (in/yd) 1 x Per Day/30 Days Secured With: chucks pad for drainage PRN 1 x Per Day/30 Days KISSA, CAMPOY ([id number]) Electronic Signature(s) Signed: 12/05/2021 12:09:48 PM By: Worthy Keeler PA-C Signed: 12/05/2021 2:38:02 PM By: Massie Kluver Entered By: Massie Kluver on 12/05/2021 11:40:00 Gandolfo, [name] ([id number]) -------------------------------------------------------------------------------- Problem List  Details Patient Name: [name] Date of Service: 12/05/2021 10:30 AM Medical Record Number: [id number] Patient Account Number: [id number] Date of Birth/Sex: 1956/03/01 (66 y.o. F) Treating RN: Cornell Barman Primary Care Provider: Ranae Plumber Other Clinician: Massie Kluver Referring Provider: Ranae Plumber Treating Provider/Extender: Skipper Cliche in Treatment: 18 Active Problems ICD-10 Encounter Code Description Active Date MDM Diagnosis Q82.0 Hereditary lymphedema 08/01/2021 No Yes L97.811 Non-pressure chronic ulcer of other part of right lower leg limited to 08/01/2021 No Yes breakdown of skin L97.821 Non-pressure chronic ulcer of other part of left lower leg limited to 08/01/2021 No Yes breakdown of skin Inactive Problems Resolved Problems Electronic Signature(s) Signed: 12/05/2021 10:40:17 AM By: Worthy Keeler PA-C Entered By: Worthy Keeler on 12/05/2021 10:40:16 Schuhmacher, [name] ([id number]) -------------------------------------------------------------------------------- Progress Note Details Patient Name: [name]. Date of Service: 12/05/2021 10:30 AM Medical Record Number: [id number] Patient Account Number: [id number] Date of Birth/Sex: [birth year] (66 y.o. F) Treating RN: Cornell Barman Primary Care Provider: Ranae Plumber Other Clinician: Massie Kluver Referring Provider: Ranae Plumber Treating Provider/Extender: Skipper Cliche in Treatment: 18 Subjective Chief Complaint Information obtained from Patient Bilateral LE lymphedema with Left LE Ulcers History of Present Illness (HPI) The patient is a 66 year old female with history of hypertension and a long-standing history of bilateral lower extremity lymphedema (first presented on 4/2) . She has had open ulcers in the past which have always responded to compression therapy. She had briefly been to a lymphedema clinic in the past which helped her at the time. this time around she stopped  treatment of her  lymphedema pumps approximately 2 weeks ago because of some pain in the knees and then noticed the right leg getting worse. She was seen by her PCP who put her on clindamycin 4 times a day 2 days ago. The patient has seen [facility] and Dr. Delana Meyer had seen her last year where a vascular study including venous and arterial duplex studies were within normal limits. he had recommended compression stockings and lymphedema pumps and the patient has been using this in about 2 weeks ago. She is known to be diabetic but in the past few time she's gone to her primary care doctor her hemoglobin A1c has been normal. 02/11/2015 - after her last visit she took my advice and went to the ER regarding the progressive cellulitis of her right lower extremity and she was admitted between July 17 and 22nd. She received IV antibiotics and then was sent home on a course of steroid-induced and oral antibiotics. She has improved much since then. 02/17/2015 -- she has been doing fine and the weeping of her legs has remarkably gone down. She has no fresh issues. READMISSION 01/15/18 This patient was given this clinic before most recently in 2016 seen by Dr. Con Memos. She has massive bilateral lymphedema and over the last 2 months this had weeping edema out of the left leg. She has compression pumps but her compliance with these has been minimal. She has [facility] they've been using TCA/ABDs/kerlix under an Ace wrap.she has had recent problems with cellulitis. She was apparently seen in the ER and 12/23/17 and given clindamycin. She was then followed by her primary doctor and given doxycycline and Keflex. The pain seems to have settled down. In April 2018 the patient had arterial studies done at [facility]. This showed triphasic waveforms throughout the right leg and mostly triphasic waveforms on the left except for monophasic at the posterior tibial artery distally. She was not felt to have  evidence of right lower extremity arterial stenosis or significant problems on the left side. 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 She also had a right lower extremity venous Doppler in January 2018 this was limited by the patient's body habitus and lymphedema. Most of the proximal veins were not visualized The patient presents with an area of denuded skin on the anterior medial part of the left calf. There is weeping edema fluid here. 01/22/18; the patient has somewhat better edema control using her compression pumps twice a day and as a result she has much better epithelialization on the left anterior calf area. Only a small open area remains. 01/29/18; the patient has been compliant with her compression pumps. Both the areas on her calf that healed. The remaining area on the left anterior leg is fully epithelialized Readmission: 02/20/2019 upon evaluation today patient presents for reevaluation due to issues that she is having with the bilateral lower extremities. She actually has wounds open on both legs. On the right she has an area in the crease of her leg on the right around the knee region which is actually draining quite a bit and actually has some fungal type appearance to it. She has been on nystatin powder that seems to have helped to some degree. In regard to the left lower extremity this is actually in the lower portion of her leg closer to the ankle and again is continuing to drain as well unfortunately. There does not appear to be any signs of active infection at this time  which is good news. No fevers, chills, nausea, vomiting, or diarrhea. She tells me that since she was seen last year she is actually been doing quite well for the most part with regard to her lower extremities. Unfortunately she now is experiencing a little bit more drainage at this time. She is concerned about getting this under control so that it does not get significantly  worse. 02/27/2019 on evaluation today patient appears to be doing somewhat better in regard to her bilateral lower extremity wounds. She has been tolerating the dressing changes without complication. Fortunately there is no signs of active infection at this point. No fevers, chills, nausea, vomiting, or diarrhea. She did get her dressing supplies which is excellent news she was extremely excited to get these. She also got paperwork from [employer] for their financial assistance program where they may be able to help her out in the future if needed with supplies at discounted prices. 03/06/2019 on evaluation today patient appears to be doing a little worse with regard to both areas of weeping on her bilateral lower extremities. This is around the right medial knee and just above the left ankle. With that being said she is unfortunately not doing as well as I would like to see. I feel like she may need to potentially go see someone at the lymphedema clinic as the wraps that she needs or even beyond what we can do here at the wound care center. She really does not have wounds she just has open areas of weeping that are causing some difficulty for her. Subsequently because of this and the moisture I am concerned about the potential for infection I am going to likely give her a prophylactic antibiotic today, Keflex, just to be on the safe side. Nonetheless again there is no obvious signs of active infection at this time. GABRIANNA, FASSNACHT ([id number]) 03/13/2019 on evaluation today patient appears to be doing well with regard to her bilateral lower extremities where she has been weeping compared to even last week's evaluation. I see some areas of new skin growth which is excellent and overall I am very pleased with how things seem to be progressing. No fevers, chills, nausea, vomiting, or diarrhea. 03/20/2019 on evaluation today patient unfortunately is continuing to have issues with significant edema of the left  lower extremity. Her right side seems to be doing much better. Unfortunately her left side is showing increased weeping of the lower portion of her leg. This is quite unfortunate obviously we were hoping to get her into the lymphedema clinic they really do not seem to when I see her how if she is draining. Despite the fact this is really not wound related but more lymphedema weeping related. Nonetheless I do not know that this can be helpful for her to even go for that appointment since again I am not sure there is much that they would actually do at this point. We may need to try a 4 layer compression wrap as best we can on her leg. She is on the Augmentin currently although I am still concerned about whether or not there could be potentially something going on infection wise I would obtain a culture though I understand is not the best being that is a surface culture I just 1 to make sure I do not seem to be missing anything. 03/27/2019 on evaluation today patient appears to be doing much better in regard to the left lower extremity compared to last week. Last week she  had tremendous weeping which I think was subsequent to infection now she seems to be doing much better and very pleased. This is not completely healed but there is a lot of new skin growth and it has dried out quite a bit. Overall I think that we are doing well with how things are moving along at this time. No fevers, chills, nausea, vomiting, or diarrhea. 04/03/2019 evaluation today patient appears to be doing a little worse this week compared to last time I saw her. I think this may be due to the fact that she is having issues with not being able to sleep in her bed at least not until last night. She is therefore been in a lift chair and subsequently has also had issues with not been able to use her pumps since she could not get in bed. With that being said the patient overall seems to be doing okay I do think I may want extend the  antibiotic for a little bit longer at least until we can see if her edema and her weeping gets better and if it is then obviously I can always discontinue the antibiotics as of next week however I want her to continue to have it over the next week. 04/10/2019 on evaluation today patient unfortunately is still doing poorly with regard to her left lower extremity. Her right is all things considering doing fairly well. On the left however she continues to have spreading of the area of infection and weeping which appears to be even a larger surface area than noted last week. She did have a positive culture for Pseudomonas in particular which seems to have been of concern she still has green/yellow discharge consistent with Pseudomonas and subsequently a tremendous amount of it. This has me obviously still concerned about the infection not really clearing up despite the fact that on culture it appears the Cipro should have been a good option for treating this. I think she may at this point need IV antibiotics since things are not doing better I do not want to get worse and cause sepsis. She is in agreement with the plan and believes as well that she likely does need to go to the hospital for IV vancomycin. Or something of the like depending on what the recommendation is from the ER. 04/17/2019 on evaluation today patient appears to be doing excellent in regard to her lower extremity on the left. She was in the hospital for several days from when I sent her last we saw her until just this past Tuesday. Fortunately her drainage is significantly improved and in fact is mostly clear. There is just a couple small areas that may still drain a little bit she states that the [facility] they prescribed for her at discharge she went picked up from pharmacy and got home but has not been able to find it since. She is looked everywhere. She is wondering if I will replace that for her today I will be more than happy to do  that. 05/01/2019 on evaluation today patient actually appears to be doing quite well with regard to her lower extremities. She occasionally is having areas that will leak and then heal up mainly when a piece of the fibrotic skin pops off but fortunately she is not having any signs of active infection at this time. Overall she also really does not have any obvious weeping at this time. I do believe however she really needs some compression wraps and I think this  may be a good time to get her back to the lymphedema clinic. 05/11/2019 on evaluation today patient actually appears to be doing quite well with regard to her bilateral lower extremities. She occasionally will have a small area that we per another but in general seems to be completely healed which is great news. Overall very pleased with how everything seems to be progressing. She does have her appointment with lymphedema clinic on November 18. 05/25/2019 on evaluation today patient appears to be doing well with regard to her left lower extremity. I am very pleased in this regard. In regard to her right leg this actually did start draining more I think it is mainly due to the fact that her leg is more swollen. I am not seeing any obvious signs of infection at this time although that is definitely something were obviously acutely aware of simply due to the fact that she had an issue not too far back with exactly this issue. Nonetheless I do feel like that lymphedema clinic would still be beneficial for her. I explained obviously if they are not able to do anything treatment wise on the right leg we could at least have them treat her left leg and then proceed from there. The patient is really in agreement with that plan. If they are able to do both as the drainage slows down that I would be happy to let them handle both. 06/01/2019 on evaluation today patient unfortunately appears to be doing worse with regard to her right lower extremity. The  left lower extremity is still maintaining at this point. Unfortunately she has been having significantly increased pain over the past several days and has been experiencing as well increased swelling of the right lower extremity. I really do not know that I am seeing anything that appears to be obvious for infection at this point to be peripherally honest. With that being said the patient does seem to be having much more swelling that she is even experienced in the past and coupled with increased pain in her hip as well I am concerned that again she could potentially have a DVT although I am not 100% sure of this. I think it something that may need to be checked out. We discussed the possibility of sending her for a DVT study through the hospital but unfortunately transportation is an issue if she does have a DVT I do not want her to wait days to be able to get in for that test however if she has this scheduled as an outpatient that is as fast that she will be able to get the test scheduled for transportation purposes. That will also fall on Thanksgiving so subsequently she did actually be looking at either Friday or even next week before we would know anything back from this. That is much too long in my opinion. Subsequent to the amount of discomfort she is experiencing the patient is actually okay with going to the ER for evaluation today. 06/12/2019 on evaluation today patient actually appears to be doing significantly better compared to last time I saw her. Following when I last saw her she was actually in the hospital from that Monday until the following Sunday almost 1 full week. She actually was placed on Keflex in the hospital following the time for her to be discharged and Dr. Steva Ready has recommended 2 times a day dosing of the Keflex for the next year in order to help with more prophylactic/preventative measures with regard to her  developing cellulitis. Overall I think this sounds like an  excellent plan. The patient unfortunately is good to have trouble being treated at lymphedema clinic due to the fact that she really cannot get up on the bed that they have there. They also state that they cannot manage her as long as she has anything draining at this point. Obviously that is somewhat unfortunate as she does need help with edema control but nonetheless we will have to do what we can for her outside of it sounds like the lymphedema clinic scenario at this point. 06/19/2019 on evaluation today patient appears to be doing fairly well with regard to her bilateral lower extremities. She is not nearly as swollen Encina, Dalary C. ([id number]) and shows no signs of infection at this point. There is no evidence of cellulitis whatsoever. She also has no open wounds or draining at this point which is also good news. No fever chills noted. She seems to be in very good spirits and in fact appears to be doing quite well. READMISSION 11/27/2019 This is a 66 year old woman that we have had in this clinic several times before including 2015, 16 and 19 and then most recently from 03/20/2019 through 06/19/2019 with bilateral lower extremity lymphedema. She has had previous arterial and reflux studies done years ago which were not all that remarkable. 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 She does have a positive family history and she had large legs starting may be in her 32s. She was recently in hospital from 10/20/2019 through 10/28/2019 with right leg cellulitis. She was given Ancef and clindamycin and then Zosyn when a culture showed Pseudomonas. At that time there was purulent drainage. She was followed by infectious disease Dr Steva Ready. The patient is now back at home. She has noted increased swelling in the right and no drainage in her right leg mostly on the posterior medial aspect in the calf area. She has not had pain or fever. She  has literally been improved lysing above dressings because her at the area of this is far too large for standard compression. She has been wrapping the areas with sheets to resorptive pads. She is found these helped somewhat. She does have an appointment with the lymphedema clinic in [place] in late June. Past medical history includes bilateral lymphedema, hypertension, obstructive sleep apnea with CPAP. Recent hospitalization with apparently Pseudomonas cellulitis of the right lower leg 12/15/2019 upon evaluation today patient appears to be doing a little bit worse in regard to her right lower extremity. Unfortunately she is having more weeping down in the lower portion of her leg. Fortunately there is no signs of active infection at this time. No fever chills noted. The patient states she is not having increased pain except for when she attempted to use the lymphedema pumps unfortunately she states that she did have pain when she did this. Otherwise we been using absorptive dressings of one type or another she is using diapers at home and then subsequently Ace wraps. In regard to the barrier cream we have discussed the possibility of derma cloud which she would like to try I do not have a problem with that. 12/22/2019 upon evaluation today patient actually appears to be doing better in regard to her leg ulcers at this point. Fortunately there does not appear to be any signs of active infection which is great news and I am extremely pleased with where things are progressing at this  time. There is no sign of active infection currently. The patient is very pleased to see things doing so well. 12/29/2019 upon evaluation today patient appears to be doing a little bit better in regard to her weeping in general over her lower extremities. She does have some signs of mild erythema little bit more than what I noted last week or rather last visit. Nonetheless I think that my threshold for switching her  antibiotics from Keflex to something else is very low at this point considering that she has had such severe infections in the past that seem to come almost out of nowhere. There is a little erythema and warmth noted of the lower portion of her leg compared to the upper which also makes me want to go ahead and address things more rapidly at this point. Likely I would switch out the Keflex for something like Levaquin ideally. 7/16; patient with severe bilateral lymphedema. She has superficial wounds albeit almost circumferential now on the left lateral lower leg. This may be new from last time. Small area on the right anterior lower leg and then another area on the right medial lower leg and of pannus fold. She has been using various absorptive garments. She states she is using her compression pumps once a day occasionally twice. Culture from her last visit here was negative 01/29/2020 on evaluation today patient appears to be doing excellent at this point in regard to her legs with regard to infection I see no signs of active infection at this point. She still does have unfortunately areas of weeping this is minimal on the right now her left is actually significantly worse although I do not think it is as bad as last week with Dr. Dellia Nims saw her. She has been trying to pump and elevate her legs is much as possible. She has previously been on the Keflex and in the past for prevention that seems to do fairly well and likely can extend that today. 02/04/2020 on evaluation today patient appears to be doing better in regard to her legs bilaterally. Fortunately there is no signs of active infection at this time which is great news and overall she has less weeping on the left compared to the right and there is several spots where she is pretty much sealed up with no draining regions. Overall very pleased in this regard. 02/19/2020 on evaluation today patient appears to be doing very well in regard to her wounds  currently. Fortunately there is no evidence of active infection overall very pleased with where things stand. She is significantly improved in regard to her edema I am extremely pleased in this regard she tells me that the popping no longer hurts and in fact she actually looks forward to it. 03/04/2020 on evaluation today patient appears to be doing excellent in regard to her lower extremities. Fortunately there is no signs of active infection at this time. No fevers, chills, nausea, vomiting, or diarrhea. 03/25/2020 on evaluation today patient appears to be doing a little bit more poorly in regard to her legs at this point. She tells me that she is still continue to have issues with drainage and this has been a little bit worse she was getting ready to start taking the Keflex again but wanted to see me first. Fortunately there is no signs of active infection at this time. No fevers, chills, nausea, vomiting, or diarrhea. 04/15/2020 upon evaluation today patient appears to be doing somewhat poorly in regard to her right leg.  She tells me she has been having more pain she has been taking the Keflex that was previously prescribed unfortunately that just does not seem to help with this. She was hoping that the pain on her right was actually coming from the fact that she was having issues with her wrap having gotten caught in her recliner. With that being said she tells me that she knew something was not right. Currently her right leg is warm to touch along with being erythematous all the way up to around at least mid thigh as far as I can see. The left leg does not appear to be doing that badly though there is increased weeping around the ankle region. 05/06/2020 on evaluation today patient appears to be doing much better than last time I saw her. She did go to the hospital where she was admitted for 2 days and treated with antibiotic therapy. She was discharged with antibiotics as well and has done  extremely well. I am extremely pleased with where things stand today. There is no signs of active infection at this time which is great news. 05/27/2020 upon evaluation today patient appears to be doing well with regard to her lower extremities bilaterally. She has just a very tiny area on the right leg which is opening on the left leg she is significantly improved though she still has several areas that do appear to be open this is minimal compared to what is been in the past. In general I am extremely pleased with where things stand today. The patient does tell me she is not been using her pumps quite as much as she should be. I do believe that is 1 area she can definitely work on. She has had a lot going on including a Covid exposure and apparently also a outbreak of likely shingles. STEPFANIE, YOTT ([id number]) 06/24/2020 upon evaluation today patient appears to be doing well with regard to her legs in general although the left leg unfortunately is showing some signs of erythema she does have a little bit of increased weeping and to be honest I am concerned about infection here. I discussed that with her today and I think that we may need to address this sooner rather than later she has been taking Keflex she is not really certain that is been making a big improvement however. No fevers, chills, nausea, vomiting, or diarrhea. 06/30/2020 upon evaluation today patient's legs actually seem to be doing better in my opinion as compared to where they were last week. Fortunately there does not appear to be any signs of active infection. Her culture showed multiple organisms nothing predominate. With that being said the Levaquin seems to have done well I think she has improved since I last saw her as well. 07/14/2020 upon evaluation today patient appears to be doing actually better in regard to her lower extremities in my opinion. She has been tolerating the dressing changes without complication.  Fortunately there is no signs of active infection at this time. 08/04/2020 upon evaluation today patient appears to be doing excellent in regard to her leg ulcers. Fortunately she has very little that is open at this point. This is great news. In fact I think that the [employer] medicated powder has been excellent for her. It seems to have done the trick where we had tried several other things without as much success. Fortunately there is no evidence of active infection at this time. No fevers, chills, nausea, vomiting, or diarrhea. 09/01/2020 upon evaluation  today patient appears to be doing a little bit more poorly than the last time I saw her. She tells me right now that she has been having a lot of drainage compared to where things were previous which has unfortunately led to more irritation as well. She is concerned that this is leading to infection. Again based on what I am seeing today as well I am also concerned of the same to be honest. 09/15/2020 upon evaluation today patient appears to be doing well with regard to her legs compared to what I saw previous. Fortunately there does not appear to be any evidence of active infection at this time which is great news. At least not as badly as what it was previous. With that being said I do believe that the patient does need to have an extension of the antibiotics. This I believe will actually help her more in the way of making sure this stays under control and does not worsen. That was the Bactrim DS. Readmission: 08/01/2021 upon evaluation today patient appears to be doing actually pretty well all things considered. Is actually been almost a year since have seen her last March. Fortunately I do not see any evidence of active infection locally nor systemically at this point. She does have a couple open areas on the left leg the right leg appears to be doing quite well. In general she has been in the hospital 3 times since I saw her a year ago all  4 cellulitis type issues except for the last 1 which was actually more related to congestive heart failure for that reason she is not using her lymphedema pumps at this point and that is probably the best idea. 08/08/2021 upon evaluation today patient appears to be doing well with regard to her legs all things considered her left leg may be a little bit more swollen based on what I see today. Fortunately I do not see any signs of active infection locally nor systemically at this time which is great news. No fevers, chills, nausea, vomiting, or diarrhea. 08/15/2021 upon evaluation today patient actually appears to be doing excellent in regard to her wounds. She has been tolerating the dressing changes without complication. Fortunately I see no evidence of infection currently which is great news. No fevers, chills, nausea, vomiting, or diarrhea. 08/29/2021 upon evaluation patient appears to be doing excellent she in fact is almost completely healed. I am actually very pleased with where we stand today and I think that she is making wonderful progress she just has a small area of weeping on the right lateral leg everything else is pretty much completely healed which is also. 09/05/2021 upon evaluation today patient appears to be doing well with regard to her legs. She does have a small area of weeping on the right leg and there is a small area on the left leg as well. It is potentially something that may need to be addressed in the future more specifically but right now I think that there may have just been a little drainage here on the left. With all that being said I think that this still does not appear to be infected and overall I feel like she is doing quite well. That something we always have to keep a very close eye on as she can change very rapidly from okay to not okay. 09/12/2021 upon evaluation today patient appears to be doing a little bit worse in regard to her leg and actually somewhat concerned  about the possibility of infection here. I discussed that with the patient. For that reason I am going to go ahead and see about getting her set up for a repeat prescription for the Bactrim. She is in agreement with that plan. 3/14; patient presents for follow-up. She has been using silver alginate with dressing changes. She is still taking Bactrim prescribed at last clinic visit. She has no issues or complaints today. She has lymphedema pumps however does not use these. 09/26/2020 upon evaluation today patient appears to actually be doing well in regard to her right leg I am pleased in that regard. Unfortunately she has new areas on her left leg that are open at this point that I do need to be addressed. I am also concerned about the warmth around one of the wounds on the more anterior side. I think this is something that we may need to culture today potentially requiring antibiotics going forward. 10/03/2021 upon evaluation today patient unfortunately is not doing nearly as well as she was even last week with her wounds. She is having some issues here with increased cellulitis of the left lower extremity unfortunately. I did prescribe Augmentin for her eczema prescribed last week unfortunately she never actually received this prescription. The pharmacy stated they never got it. We double checked with them today they still did not receive it. I am not sure what happened in that regard. With that being said the patient is in good spirits today she does not appear to be as sick as where she normally is when the cellulitis gets significantly worse as it has in the past. Nonetheless the leg is much more red not just around the ankle where I was seeing at last week on Tuesday but rather now this is going all the way up to almost her knee and is definitely hot to touch compared to the right leg. Nonetheless I feel like she does need to go to the ER for likely IV antibiotics. 10-10-2021 upon evaluation today  patient appears to be doing well with regard to her wound. She has been tolerating the dressing changes that appears to be doing much better. After I saw her last week she did go to the ER they admitted her to the hospital and gave her IV antibiotics she fortunately is doing significantly better. Overall I am extremely pleased with where things stand today. 10-17-2021 upon evaluation today patient appears to be doing well with regard to her wound. In fact this is looking better and her leg is looking better but she still does have some areas here of erythema. We will continue to address this as soon as possible in my opinion. I do believe she may require some debridement and what appears to be a more defined wound on her left leg at this point. 10-24-2021 upon evaluation patient is definitely showing signs of improvement which is great news. I do not see any evidence of active infection Edge, [place]. ([id number]) locally or systemically which is great news as well. No fevers, chills, nausea, vomiting, or diarrhea. 10-31-2021. Upon evaluation today patient's leg actually feels better from an infection and wound care perspective. I am actually very pleased in this regard. Unfortunately the biggest issue that I see at this time is that the patient is having a significant issue with her breathing. I did put her on the pulse ox machine to check her oxygen saturation and it was pretty much at 100% which is good but she tells me  that when she is walking this is much more difficult for her. She also tells me that when she is trying to bend over to perform her dressing changes she is so short of breath due to the swelling and fluid in her abdominal area that she is just not been able to do this. She is tearful and very concerned today to be honest. I am truly sorry to see her this way I know she is really having a hard time at the moment. 11-14-2021 upon evaluation patient actually appears to be doing  significantly better compared to when I last saw her. She was actually admitted to the hospital I believe this for 6 days total after I sent her on 10-31-2021. Subsequently they actually pulled off greater than 50 pounds of fluid she tells me she feels significantly better her legs are not as tight she actually has some play in the tissue and it is obvious that she is doing much better just from a mobility standpoint as well as a breathing standpoint. Overall I am extremely happy for her and how she is doing the leg also looks much better on the left. 11-21-2021 upon evaluation today patient appears to be doing well although it does appear that she is going require some sharp debridement the wound is very dry this is the opposite of what its been she was having so much weeping that it was staying extremely wet. Nonetheless we do need to see about going ahead and debriding the wound today. 11-28-2021 upon evaluation today patient appears to be doing well with regard to her wound I definitely see signs of things continuing to improve which is great news. I do not see any evidence of active infection locally or systemically also great news. 12-05-2021 upon evaluation today patient appears to be doing well with regard to her wound. She has been tolerating the dressing changes. Fortunately there does not appear to be any signs of active infection locally or systemically at this time. No fevers, chills, nausea, vomiting, or diarrhea. Objective Constitutional Well-nourished and well-hydrated in no acute distress. Vitals Time Taken: 10:43 AM, Height: 63 in, Weight: 372 lbs, BMI: 65.9, Temperature: 98.2 F, Pulse: 75 bpm, Respiratory Rate: 18 breaths/min, Blood Pressure: 103/65 mmHg. Respiratory normal breathing without difficulty. Psychiatric this patient is able to make decisions and demonstrates good insight into disease process. Alert and Oriented x 3. pleasant and cooperative. General Notes: Upon  inspection patient's wound bed did require some sharp debridement to clear away some of the necrotic debris this was a much smaller area than the full measurement due to the scattered nature and a lot of new skin that is growing in between. With that being said I did clear away the slough and biofilm down to get subcutaneous tissue patient tolerated that without complication. Integumentary (Hair, Skin) Wound #10 status is Open. Original cause of wound was Gradually Appeared. The date acquired was: 10/17/2021. The wound has been in treatment 7 weeks. The wound is located on the Left,Medial,Anterior Lower Leg. The wound measures 2.5cm length x 4cm width x 0.1cm depth; 7.854cm^2 area and 0.785cm^3 volume. There is Fat Layer (Subcutaneous Tissue) exposed. There is no tunneling or undermining noted. There is a medium amount of serosanguineous drainage noted. There is medium (34-66%) red granulation within the wound bed. There is a small (1-33%) amount of necrotic tissue within the wound bed including Adherent Slough. 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 ([id number]) Procedures Wound #10 Pre-procedure diagnosis of Wound #10 is a Lymphedema located on the Left,Medial,Anterior Lower Leg . There was a Excisional Skin/Subcutaneous Tissue Debridement with a total area of 1 sq cm performed by Tommie Sams., PA-C. With the following instrument(s): Curette to remove Viable and Non-Viable tissue/material. Material removed includes Subcutaneous Tissue, Slough, and Biofilm. A time out was conducted at 11:20, prior to the start of the procedure. A Minimum amount of bleeding was controlled with Pressure. The procedure was tolerated well. Post Debridement Measurements: 2.5cm length x 4cm width x 0.1cm  depth; 0.785cm^3 volume. Character of Wound/Ulcer Post Debridement is stable. Post procedure Diagnosis Wound #10: Same as Pre-Procedure Plan 1. I am can recommend that we going continue with the wound care measures as before and the patient is in agreement with plan. This includes the use of the Xeroform gauze dressing which I think is still good to be a good way to go. 2. Most likely recommend that we have the patient continue with the ABD pad cover followed by roll gauze and Ace wrap to secure in place. 3. I am also going to suggest that she continue to monitor for any signs of infection she is also keeping a close eye on her fluid due to the congestive heart failure and overall I think we are on the right track here. We will see patient back for reevaluation in 1 week here in the clinic. If anything worsens or changes patient will contact our office for additional recommendations. Electronic Signature(s) Signed: 12/05/2021 11:27:45 AM By: Worthy Keeler PA-C Entered By: Worthy Keeler on 12/05/2021 11:27:45 Iorio, [name] ([id number]) -------------------------------------------------------------------------------- SuperBill Details Patient Name: [name] Date of Service: 12/05/2021 Medical Record Number: [id number] Patient Account Number: [id number] Date of Birth/Sex: 22-Jun-1956 (66 y.o. F) Treating RN: Cornell Barman Primary Care Provider: Ranae Plumber Other Clinician: Massie Kluver Referring Provider: Ranae Plumber Treating Provider/Extender: Skipper Cliche in Treatment: 18 Diagnosis Coding ICD-10 Codes Code Description Q82.0 Hereditary lymphedema L97.811 Non-pressure chronic ulcer of other part of right lower leg limited to breakdown of skin L97.821 Non-pressure chronic ulcer of other part of left lower leg limited to breakdown of skin Facility Procedures CPT4 Code: [id number] Description: 25366 - DEB SUBQ TISSUE 20 SQ CM/< Modifier: Quantity: 1 CPT4  Code: Description: ICD-10 Diagnosis Description L97.821 Non-pressure chronic ulcer of other part of left lower leg limited to brea Modifier: kdown of skin Quantity: Physician Procedures CPT4 Code: 4403474 Description: 25956 - WC PHYS SUBQ TISS 20 SQ CM Modifier: Quantity: 1 CPT4 Code: Description: ICD-10 Diagnosis Description L97.821 Non-pressure chronic ulcer of other part of left lower leg limited to brea Modifier: kdown of skin Quantity: Electronic Signature(s) Signed: 12/05/2021 11:27:54 AM By: Worthy Keeler PA-C Entered By: Worthy Keeler on 12/05/2021 11:27:54

## 2021-12-12 ENCOUNTER — Encounter
Admission: RE | Admit: 2021-12-12 | Discharge: 2021-12-12 | Disposition: A | Discharge status: Home / Self Care | Payer: Medicare HMO | Source: Ambulatory Visit | Attending: Family | Admitting: Family

## 2021-12-12 ENCOUNTER — Encounter: Payer: Medicare HMO | Attending: Physician Assistant | Admitting: Physician Assistant

## 2021-12-12 DIAGNOSIS — Z01812 Encounter for preprocedural laboratory examination: Secondary | ICD-10-CM | POA: Diagnosis not present

## 2021-12-12 DIAGNOSIS — Q820 Hereditary lymphedema: Secondary | ICD-10-CM | POA: Insufficient documentation

## 2021-12-12 DIAGNOSIS — I10 Essential (primary) hypertension: Secondary | ICD-10-CM | POA: Insufficient documentation

## 2021-12-12 DIAGNOSIS — J45909 Unspecified asthma, uncomplicated: Secondary | ICD-10-CM | POA: Insufficient documentation

## 2021-12-12 DIAGNOSIS — M109 Gout, unspecified: Secondary | ICD-10-CM | POA: Insufficient documentation

## 2021-12-12 DIAGNOSIS — I5032 Chronic diastolic (congestive) heart failure: Secondary | ICD-10-CM | POA: Insufficient documentation

## 2021-12-12 DIAGNOSIS — G4733 Obstructive sleep apnea (adult) (pediatric): Secondary | ICD-10-CM | POA: Diagnosis not present

## 2021-12-12 DIAGNOSIS — L97821 Non-pressure chronic ulcer of other part of left lower leg limited to breakdown of skin: Secondary | ICD-10-CM | POA: Insufficient documentation

## 2021-12-12 DIAGNOSIS — Z6841 Body Mass Index (BMI) 40.0 and over, adult: Secondary | ICD-10-CM | POA: Diagnosis not present

## 2021-12-12 DIAGNOSIS — L97811 Non-pressure chronic ulcer of other part of right lower leg limited to breakdown of skin: Secondary | ICD-10-CM | POA: Insufficient documentation

## 2021-12-12 DIAGNOSIS — M1990 Unspecified osteoarthritis, unspecified site: Secondary | ICD-10-CM | POA: Diagnosis not present

## 2021-12-12 DIAGNOSIS — E669 Obesity, unspecified: Secondary | ICD-10-CM | POA: Diagnosis not present

## 2021-12-12 LAB — BASIC METABOLIC PANEL
Anion gap: 12 (ref 5–15)
BUN: 42 mg/dL — ABNORMAL HIGH (ref 8–23)
CO2: 25 mmol/L (ref 22–32)
Calcium: 8.8 mg/dL — ABNORMAL LOW (ref 8.9–10.3)
Chloride: 101 mmol/L (ref 98–111)
Creatinine, Ser: 1.46 mg/dL — ABNORMAL HIGH (ref 0.44–1.00)
GFR, Estimated: 39 mL/min — ABNORMAL LOW (ref >60)
Glucose, Bld: 86 mg/dL (ref 70–99)
Potassium: 3.6 mmol/L (ref 3.5–5.1)
Sodium: 138 mmol/L (ref 135–145)

## 2021-12-12 NOTE — Progress Notes (Signed)
Elizabeth, [name] ([id number]) Visit Report for 12/12/2021 Chief Complaint Document Details Patient Name: [name], [name]. Date of Service: 12/12/2021 1:00 PM Medical Record Number: [id number] Patient Account Number: [id number] Date of Birth/Sex: 10-02-1955 (66 y.o. F) Treating RN: [name] Dredge Primary Care Provider: Ranae Plumber Other Clinician: Referring Provider: Ranae Plumber Treating Provider/Extender: Skipper Cliche in Treatment: 19 Information Obtained from: Patient Chief Complaint Bilateral LE lymphedema with Left LE Ulcers Electronic Signature(s) Signed: 12/12/2021 1:06:55 PM By: Worthy Keeler PA-C Entered By: Worthy Keeler on 12/12/2021 13:06:55 Finan, [name] ([id number]) -------------------------------------------------------------------------------- HPI Details Patient Name: [name] Date of Service: 12/12/2021 1:00 PM Medical Record Number: [id number] Patient Account Number: [id number] Date of Birth/Sex: 1955/09/16 (66 y.o. F) Treating RN: [name] Dredge Primary Care Provider: Ranae Plumber Other Clinician: Referring Provider: Ranae Plumber Treating Provider/Extender: Skipper Cliche in Treatment: 19 History of Present Illness HPI Description: The patient is a 66 year old female with history of hypertension and a long-standing history of bilateral lower extremity lymphedema (first presented on 4/2) . She has had open ulcers in the past which have always responded to compression therapy. She had briefly been to a lymphedema clinic in the past which helped her at the time. this time around she stopped treatment of her lymphedema pumps approximately 2 weeks ago because of some pain in the knees and then noticed the right leg getting worse. She was seen by her PCP who put her on clindamycin 4 times a day 2 days ago. The patient has seen [facility] and Dr. Delana Meyer had seen her last year where a vascular study including venous and arterial duplex studies  were within normal limits. he had recommended compression stockings and lymphedema pumps and the patient has been using this in about 2 weeks ago. She is known to be diabetic but in the past few time she's gone to her primary care doctor her hemoglobin A1c has been normal. 02/11/2015 - after her last visit she took my advice and went to the ER regarding the progressive cellulitis of her right lower extremity and she was admitted between July 17 and 22nd. She received IV antibiotics and then was sent home on a course of steroid-induced and oral antibiotics. She has improved much since then. 02/17/2015 -- she has been doing fine and the weeping of her legs has remarkably gone down. She has no fresh issues. READMISSION 01/15/18 This patient was given this clinic before most recently in 2016 seen by Dr. Con Memos. She has massive bilateral lymphedema and over the last 2 months this had weeping edema out of the left leg. She has compression pumps but her compliance with these has been minimal. She has [facility] they've been using TCA/ABDs/kerlix under an Ace wrap.she has had recent problems with cellulitis. She was apparently seen in the ER and 12/23/17 and given clindamycin. She was then followed by her primary doctor and given doxycycline and Keflex. The pain seems to have settled down. In April 2018 the patient had arterial studies done at [facility]. This showed triphasic waveforms throughout the right leg and mostly triphasic waveforms on the left except for monophasic at the posterior tibial artery distally. She was not felt to have evidence of right lower extremity arterial stenosis or significant problems on the left side. 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 She also had a right lower extremity venous Doppler in January 2018 this was limited by the patient's body  habitus and lymphedema. Most of the proximal veins were not visualized The  patient presents with an area of denuded skin on the anterior medial part of the left calf. There is weeping edema fluid here. 01/22/18; the patient has somewhat better edema control using her compression pumps twice a day and as a result she has much better epithelialization on the left anterior calf area. Only a small open area remains. 01/29/18; the patient has been compliant with her compression pumps. Both the areas on her calf that healed. The remaining area on the left anterior leg is fully epithelialized Readmission: 02/20/2019 upon evaluation today patient presents for reevaluation due to issues that she is having with the bilateral lower extremities. She actually has wounds open on both legs. On the right she has an area in the crease of her leg on the right around the knee region which is actually draining quite a bit and actually has some fungal type appearance to it. She has been on nystatin powder that seems to have helped to some degree. In regard to the left lower extremity this is actually in the lower portion of her leg closer to the ankle and again is continuing to drain as well unfortunately. There does not appear to be any signs of active infection at this time which is good news. No fevers, chills, nausea, vomiting, or diarrhea. She tells me that since she was seen last year she is actually been doing quite well for the most part with regard to her lower extremities. Unfortunately she now is experiencing a little bit more drainage at this time. She is concerned about getting this under control so that it does not get significantly worse. 02/27/2019 on evaluation today patient appears to be doing somewhat better in regard to her bilateral lower extremity wounds. She has been tolerating the dressing changes without complication. Fortunately there is no signs of active infection at this point. No fevers, chills, nausea, vomiting, or diarrhea. She did get her dressing supplies which is  excellent news she was extremely excited to get these. She also got paperwork from [employer] for their financial assistance program where they may be able to help her out in the future if needed with supplies at discounted prices. 03/06/2019 on evaluation today patient appears to be doing a little worse with regard to both areas of weeping on her bilateral lower extremities. This is around the right medial knee and just above the left ankle. With that being said she is unfortunately not doing as well as I would like to see. I feel like she may need to potentially go see someone at the lymphedema clinic as the wraps that she needs or even beyond what we can do here at the wound care center. She really does not have wounds she just has open areas of weeping that are causing some difficulty for her. Subsequently because of this and the moisture I am concerned about the potential for infection I am going to likely give her a prophylactic antibiotic today, Keflex, just to be on the safe side. Nonetheless again there is no obvious signs of active infection at this time. 03/13/2019 on evaluation today patient appears to be doing well with regard to her bilateral lower extremities where she has been weeping compared to even last week's evaluation. I see some areas of new skin growth which is excellent and overall I am very pleased with how things seem to be progressing. No fevers, chills, nausea, vomiting, or diarrhea. Tengan,  SHANDA CADOTTE ([id number]) 03/20/2019 on evaluation today patient unfortunately is continuing to have issues with significant edema of the left lower extremity. Her right side seems to be doing much better. Unfortunately her left side is showing increased weeping of the lower portion of her leg. This is quite unfortunate obviously we were hoping to get her into the lymphedema clinic they really do not seem to when I see her how if she is draining. Despite the fact this is really not wound related  but more lymphedema weeping related. Nonetheless I do not know that this can be helpful for her to even go for that appointment since again I am not sure there is much that they would actually do at this point. We may need to try a 4 layer compression wrap as best we can on her leg. She is on the Augmentin currently although I am still concerned about whether or not there could be potentially something going on infection wise I would obtain a culture though I understand is not the best being that is a surface culture I just 1 to make sure I do not seem to be missing anything. 03/27/2019 on evaluation today patient appears to be doing much better in regard to the left lower extremity compared to last week. Last week she had tremendous weeping which I think was subsequent to infection now she seems to be doing much better and very pleased. This is not completely healed but there is a lot of new skin growth and it has dried out quite a bit. Overall I think that we are doing well with how things are moving along at this time. No fevers, chills, nausea, vomiting, or diarrhea. 04/03/2019 evaluation today patient appears to be doing a little worse this week compared to last time I saw her. I think this may be due to the fact that she is having issues with not being able to sleep in her bed at least not until last night. She is therefore been in a lift chair and subsequently has also had issues with not been able to use her pumps since she could not get in bed. With that being said the patient overall seems to be doing okay I do think I may want extend the antibiotic for a little bit longer at least until we can see if her edema and her weeping gets better and if it is then obviously I can always discontinue the antibiotics as of next week however I want her to continue to have it over the next week. 04/10/2019 on evaluation today patient unfortunately is still doing poorly with regard to her left lower  extremity. Her right is all things considering doing fairly well. On the left however she continues to have spreading of the area of infection and weeping which appears to be even a larger surface area than noted last week. She did have a positive culture for Pseudomonas in particular which seems to have been of concern she still has green/yellow discharge consistent with Pseudomonas and subsequently a tremendous amount of it. This has me obviously still concerned about the infection not really clearing up despite the fact that on culture it appears the Cipro should have been a good option for treating this. I think she may at this point need IV antibiotics since things are not doing better I do not want to get worse and cause sepsis. She is in agreement with the plan and believes as well that she likely does need  to go to the hospital for IV vancomycin. Or something of the like depending on what the recommendation is from the ER. 04/17/2019 on evaluation today patient appears to be doing excellent in regard to her lower extremity on the left. She was in the hospital for several days from when I sent her last we saw her until just this past Tuesday. Fortunately her drainage is significantly improved and in fact is mostly clear. There is just a couple small areas that may still drain a little bit she states that the [facility] they prescribed for her at discharge she went picked up from pharmacy and got home but has not been able to find it since. She is looked everywhere. She is wondering if I will replace that for her today I will be more than happy to do that. 05/01/2019 on evaluation today patient actually appears to be doing quite well with regard to her lower extremities. She occasionally is having areas that will leak and then heal up mainly when a piece of the fibrotic skin pops off but fortunately she is not having any signs of active infection at this time. Overall she also really does not have  any obvious weeping at this time. I do believe however she really needs some compression wraps and I think this may be a good time to get her back to the lymphedema clinic. 05/11/2019 on evaluation today patient actually appears to be doing quite well with regard to her bilateral lower extremities. She occasionally will have a small area that we per another but in general seems to be completely healed which is great news. Overall very pleased with how everything seems to be progressing. She does have her appointment with lymphedema clinic on November 18. 05/25/2019 on evaluation today patient appears to be doing well with regard to her left lower extremity. I am very pleased in this regard. In regard to her right leg this actually did start draining more I think it is mainly due to the fact that her leg is more swollen. I am not seeing any obvious signs of infection at this time although that is definitely something were obviously acutely aware of simply due to the fact that she had an issue not too far back with exactly this issue. Nonetheless I do feel like that lymphedema clinic would still be beneficial for her. I explained obviously if they are not able to do anything treatment wise on the right leg we could at least have them treat her left leg and then proceed from there. The patient is really in agreement with that plan. If they are able to do both as the drainage slows down that I would be happy to let them handle both. 06/01/2019 on evaluation today patient unfortunately appears to be doing worse with regard to her right lower extremity. The left lower extremity is still maintaining at this point. Unfortunately she has been having significantly increased pain over the past several days and has been experiencing as well increased swelling of the right lower extremity. I really do not know that I am seeing anything that appears to be obvious for infection at this point to be peripherally  honest. With that being said the patient does seem to be having much more swelling that she is even experienced in the past and coupled with increased pain in her hip as well I am concerned that again she could potentially have a DVT although I am not 100% sure of this. I think  it something that may need to be checked out. We discussed the possibility of sending her for a DVT study through the hospital but unfortunately transportation is an issue if she does have a DVT I do not want her to wait days to be able to get in for that test however if she has this scheduled as an outpatient that is as fast that she will be able to get the test scheduled for transportation purposes. That will also fall on Thanksgiving so subsequently she did actually be looking at either Friday or even next week before we would know anything back from this. That is much too long in my opinion. Subsequent to the amount of discomfort she is experiencing the patient is actually okay with going to the ER for evaluation today. 06/12/2019 on evaluation today patient actually appears to be doing significantly better compared to last time I saw her. Following when I last saw her she was actually in the hospital from that Monday until the following Sunday almost 1 full week. She actually was placed on Keflex in the hospital following the time for her to be discharged and Dr. Steva Ready has recommended 2 times a day dosing of the Keflex for the next year in order to help with more prophylactic/preventative measures with regard to her developing cellulitis. Overall I think this sounds like an excellent plan. The patient unfortunately is good to have trouble being treated at lymphedema clinic due to the fact that she really cannot get up on the bed that they have there. They also state that they cannot manage her as long as she has anything draining at this point. Obviously that is somewhat unfortunate as she does need help with edema  control but nonetheless we will have to do what we can for her outside of it sounds like the lymphedema clinic scenario at this point. 06/19/2019 on evaluation today patient appears to be doing fairly well with regard to her bilateral lower extremities. She is not nearly as swollen and shows no signs of infection at this point. There is no evidence of cellulitis whatsoever. She also has no open wounds or draining at this point which is also good news. No fever chills noted. She seems to be in very good spirits and in fact appears to be doing quite well. READMISSION 11/27/2019 ANISSIA, WESSELLS ([id number]) This is a 66 year old woman that we have had in this clinic several times before including 2015, 16 and 19 and then most recently from 03/20/2019 through 06/19/2019 with bilateral lower extremity lymphedema. She has had previous arterial and reflux studies done years ago which were not all that remarkable. 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 She does have a positive family history and she had large legs starting may be in her 56s. She was recently in hospital from 10/20/2019 through 10/28/2019 with right leg cellulitis. She was given Ancef and clindamycin and then Zosyn when a culture showed Pseudomonas. At that time there was purulent drainage. She was followed by infectious disease Dr Steva Ready. The patient is now back at home. She has noted increased swelling in the right and no drainage in her right leg mostly on the posterior medial aspect in the calf area. She has not had pain or fever. She has literally been improved lysing above dressings because her at the area of this is far too large for standard compression. She has been wrapping the areas with sheets to resorptive  pads. She is found these helped somewhat. She does have an appointment with the lymphedema clinic in [place] in late June. Past medical history includes bilateral  lymphedema, hypertension, obstructive sleep apnea with CPAP. Recent hospitalization with apparently Pseudomonas cellulitis of the right lower leg 12/15/2019 upon evaluation today patient appears to be doing a little bit worse in regard to her right lower extremity. Unfortunately she is having more weeping down in the lower portion of her leg. Fortunately there is no signs of active infection at this time. No fever chills noted. The patient states she is not having increased pain except for when she attempted to use the lymphedema pumps unfortunately she states that she did have pain when she did this. Otherwise we been using absorptive dressings of one type or another she is using diapers at home and then subsequently Ace wraps. In regard to the barrier cream we have discussed the possibility of derma cloud which she would like to try I do not have a problem with that. 12/22/2019 upon evaluation today patient actually appears to be doing better in regard to her leg ulcers at this point. Fortunately there does not appear to be any signs of active infection which is great news and I am extremely pleased with where things are progressing at this time. There is no sign of active infection currently. The patient is very pleased to see things doing so well. 12/29/2019 upon evaluation today patient appears to be doing a little bit better in regard to her weeping in general over her lower extremities. She does have some signs of mild erythema little bit more than what I noted last week or rather last visit. Nonetheless I think that my threshold for switching her antibiotics from Keflex to something else is very low at this point considering that she has had such severe infections in the past that seem to come almost out of nowhere. There is a little erythema and warmth noted of the lower portion of her leg compared to the upper which also makes me want to go ahead and address things more rapidly at this point.  Likely I would switch out the Keflex for something like Levaquin ideally. 7/16; patient with severe bilateral lymphedema. She has superficial wounds albeit almost circumferential now on the left lateral lower leg. This may be new from last time. Small area on the right anterior lower leg and then another area on the right medial lower leg and of pannus fold. She has been using various absorptive garments. She states she is using her compression pumps once a day occasionally twice. Culture from her last visit here was negative 01/29/2020 on evaluation today patient appears to be doing excellent at this point in regard to her legs with regard to infection I see no signs of active infection at this point. She still does have unfortunately areas of weeping this is minimal on the right now her left is actually significantly worse although I do not think it is as bad as last week with Dr. Dellia Nims saw her. She has been trying to pump and elevate her legs is much as possible. She has previously been on the Keflex and in the past for prevention that seems to do fairly well and likely can extend that today. 02/04/2020 on evaluation today patient appears to be doing better in regard to her legs bilaterally. Fortunately there is no signs of active infection at this time which is great news and overall she has less weeping  on the left compared to the right and there is several spots where she is pretty much sealed up with no draining regions. Overall very pleased in this regard. 02/19/2020 on evaluation today patient appears to be doing very well in regard to her wounds currently. Fortunately there is no evidence of active infection overall very pleased with where things stand. She is significantly improved in regard to her edema I am extremely pleased in this regard she tells me that the popping no longer hurts and in fact she actually looks forward to it. 03/04/2020 on evaluation today patient appears to be doing  excellent in regard to her lower extremities. Fortunately there is no signs of active infection at this time. No fevers, chills, nausea, vomiting, or diarrhea. 03/25/2020 on evaluation today patient appears to be doing a little bit more poorly in regard to her legs at this point. She tells me that she is still continue to have issues with drainage and this has been a little bit worse she was getting ready to start taking the Keflex again but wanted to see me first. Fortunately there is no signs of active infection at this time. No fevers, chills, nausea, vomiting, or diarrhea. 04/15/2020 upon evaluation today patient appears to be doing somewhat poorly in regard to her right leg. She tells me she has been having more pain she has been taking the Keflex that was previously prescribed unfortunately that just does not seem to help with this. She was hoping that the pain on her right was actually coming from the fact that she was having issues with her wrap having gotten caught in her recliner. With that being said she tells me that she knew something was not right. Currently her right leg is warm to touch along with being erythematous all the way up to around at least mid thigh as far as I can see. The left leg does not appear to be doing that badly though there is increased weeping around the ankle region. 05/06/2020 on evaluation today patient appears to be doing much better than last time I saw her. She did go to the hospital where she was admitted for 2 days and treated with antibiotic therapy. She was discharged with antibiotics as well and has done extremely well. I am extremely pleased with where things stand today. There is no signs of active infection at this time which is great news. 05/27/2020 upon evaluation today patient appears to be doing well with regard to her lower extremities bilaterally. She has just a very tiny area on the right leg which is opening on the left leg she is significantly  improved though she still has several areas that do appear to be open this is minimal compared to what is been in the past. In general I am extremely pleased with where things stand today. The patient does tell me she is not been using her pumps quite as much as she should be. I do believe that is 1 area she can definitely work on. She has had a lot going on including a Covid exposure and apparently also a outbreak of likely shingles. 06/24/2020 upon evaluation today patient appears to be doing well with regard to her legs in general although the left leg unfortunately is showing some signs of erythema she does have a little bit of increased weeping and to be honest I am concerned about infection here. I discussed that with her today and I think that we may need to address  this sooner rather than later she has been taking Keflex she is not really certain that is been making a big improvement however. No fevers, chills, nausea, vomiting, or diarrhea. SYBOL, MORRE ([id number]) 06/30/2020 upon evaluation today patient's legs actually seem to be doing better in my opinion as compared to where they were last week. Fortunately there does not appear to be any signs of active infection. Her culture showed multiple organisms nothing predominate. With that being said the Levaquin seems to have done well I think she has improved since I last saw her as well. 07/14/2020 upon evaluation today patient appears to be doing actually better in regard to her lower extremities in my opinion. She has been tolerating the dressing changes without complication. Fortunately there is no signs of active infection at this time. 08/04/2020 upon evaluation today patient appears to be doing excellent in regard to her leg ulcers. Fortunately she has very little that is open at this point. This is great news. In fact I think that the Goldbond medicated powder has been excellent for her. It seems to have done the trick where we had  tried several other things without as much success. Fortunately there is no evidence of active infection at this time. No fevers, chills, nausea, vomiting, or diarrhea. 09/01/2020 upon evaluation today patient appears to be doing a little bit more poorly than the last time I saw her. She tells me right now that she has been having a lot of drainage compared to where things were previous which has unfortunately led to more irritation as well. She is concerned that this is leading to infection. Again based on what I am seeing today as well I am also concerned of the same to be honest. 09/15/2020 upon evaluation today patient appears to be doing well with regard to her legs compared to what I saw previous. Fortunately there does not appear to be any evidence of active infection at this time which is great news. At least not as badly as what it was previous. With that being said I do believe that the patient does need to have an extension of the antibiotics. This I believe will actually help her more in the way of making sure this stays under control and does not worsen. That was the Bactrim DS. Readmission: 08/01/2021 upon evaluation today patient appears to be doing actually pretty well all things considered. Is actually been almost a year since have seen her last March. Fortunately I do not see any evidence of active infection locally nor systemically at this point. She does have a couple open areas on the left leg the right leg appears to be doing quite well. In general she has been in the hospital 3 times since I saw her a year ago all 4 cellulitis type issues except for the last 1 which was actually more related to congestive heart failure for that reason she is not using her lymphedema pumps at this point and that is probably the best idea. 08/08/2021 upon evaluation today patient appears to be doing well with regard to her legs all things considered her left leg may be a little bit more swollen based  on what I see today. Fortunately I do not see any signs of active infection locally nor systemically at this time which is great news. No fevers, chills, nausea, vomiting, or diarrhea. 08/15/2021 upon evaluation today patient actually appears to be doing excellent in regard to her wounds. She has been tolerating the dressing  changes without complication. Fortunately I see no evidence of infection currently which is great news. No fevers, chills, nausea, vomiting, or diarrhea. 08/29/2021 upon evaluation patient appears to be doing excellent she in fact is almost completely healed. I am actually very pleased with where we stand today and I think that she is making wonderful progress she just has a small area of weeping on the right lateral leg everything else is pretty much completely healed which is also. 09/05/2021 upon evaluation today patient appears to be doing well with regard to her legs. She does have a small area of weeping on the right leg and there is a small area on the left leg as well. It is potentially something that may need to be addressed in the future more specifically but right now I think that there may have just been a little drainage here on the left. With all that being said I think that this still does not appear to be infected and overall I feel like she is doing quite well. That something we always have to keep a very close eye on as she can change very rapidly from okay to not okay. 09/12/2021 upon evaluation today patient appears to be doing a little bit worse in regard to her leg and actually somewhat concerned about the possibility of infection here. I discussed that with the patient. For that reason I am going to go ahead and see about getting her set up for a repeat prescription for the Bactrim. She is in agreement with that plan. 3/14; patient presents for follow-up. She has been using silver alginate with dressing changes. She is still taking Bactrim prescribed at last  clinic visit. She has no issues or complaints today. She has lymphedema pumps however does not use these. 09/26/2020 upon evaluation today patient appears to actually be doing well in regard to her right leg I am pleased in that regard. Unfortunately she has new areas on her left leg that are open at this point that I do need to be addressed. I am also concerned about the warmth around one of the wounds on the more anterior side. I think this is something that we may need to culture today potentially requiring antibiotics going forward. 10/03/2021 upon evaluation today patient unfortunately is not doing nearly as well as she was even last week with her wounds. She is having some issues here with increased cellulitis of the left lower extremity unfortunately. I did prescribe Augmentin for her eczema prescribed last week unfortunately she never actually received this prescription. The pharmacy stated they never got it. We double checked with them today they still did not receive it. I am not sure what happened in that regard. With that being said the patient is in good spirits today she does not appear to be as sick as where she normally is when the cellulitis gets significantly worse as it has in the past. Nonetheless the leg is much more red not just around the ankle where I was seeing at last week on Tuesday but rather now this is going all the way up to almost her knee and is definitely hot to touch compared to the right leg. Nonetheless I feel like she does need to go to the ER for likely IV antibiotics. 10-10-2021 upon evaluation today patient appears to be doing well with regard to her wound. She has been tolerating the dressing changes that appears to be doing much better. After I saw her last week she  did go to the ER they admitted her to the hospital and gave her IV antibiotics she fortunately is doing significantly better. Overall I am extremely pleased with where things stand today. 10-17-2021  upon evaluation today patient appears to be doing well with regard to her wound. In fact this is looking better and her leg is looking better but she still does have some areas here of erythema. We will continue to address this as soon as possible in my opinion. I do believe she may require some debridement and what appears to be a more defined wound on her left leg at this point. 10-24-2021 upon evaluation patient is definitely showing signs of improvement which is great news. I do not see any evidence of active infection locally or systemically which is great news as well. No fevers, chills, nausea, vomiting, or diarrhea. 10-31-2021. Upon evaluation today patient's leg actually feels better from an infection and wound care perspective. I am actually very pleased in this regard. Unfortunately the biggest issue that I see at this time is that the patient is having a significant issue with her breathing. I did put her on the pulse ox machine to check her oxygen saturation and it was pretty much at 100% which is good but she tells me that when she is walking Dorvil, Jenisha C. ([id number]) this is much more difficult for her. She also tells me that when she is trying to bend over to perform her dressing changes she is so short of breath due to the swelling and fluid in her abdominal area that she is just not been able to do this. She is tearful and very concerned today to be honest. I am truly sorry to see her this way I know she is really having a hard time at the moment. 11-14-2021 upon evaluation patient actually appears to be doing significantly better compared to when I last saw her. She was actually admitted to the hospital I believe this for 6 days total after I sent her on 10-31-2021. Subsequently they actually pulled off greater than 50 pounds of fluid she tells me she feels significantly better her legs are not as tight she actually has some play in the tissue and it is obvious that she is doing  much better just from a mobility standpoint as well as a breathing standpoint. Overall I am extremely happy for her and how she is doing the leg also looks much better on the left. 11-21-2021 upon evaluation today patient appears to be doing well although it does appear that she is going require some sharp debridement the wound is very dry this is the opposite of what its been she was having so much weeping that it was staying extremely wet. Nonetheless we do need to see about going ahead and debriding the wound today. 11-28-2021 upon evaluation today patient appears to be doing well with regard to her wound I definitely see signs of things continuing to improve which is great news. I do not see any evidence of active infection locally or systemically also great news. 12-05-2021 upon evaluation today patient appears to be doing well with regard to her wound. She has been tolerating the dressing changes. Fortunately there does not appear to be any signs of active infection locally or systemically at this time. No fevers, chills, nausea, vomiting, or diarrhea. 12-12-2021 upon evaluation patient appears to be doing well with regard to her wound this is actually measuring smaller and looking much better. Fortunately I do  not see any signs of active infection locally or systemically at this time which is excellent news. Electronic Signature(s) Signed: 12/12/2021 1:33:00 PM By: Worthy Keeler PA-C Entered By: Worthy Keeler on 12/12/2021 13:33:00 Viswanathan, [name] ([id number]) -------------------------------------------------------------------------------- Physical Exam Details Patient Name: MARRIAN, BELLS C. Date of Service: 12/12/2021 1:00 PM Medical Record Number: [id number] Patient Account Number: [id number] Date of Birth/Sex: 07/18/1955 (66 y.o. F) Treating RN: [name] Dredge Primary Care Provider: Ranae Plumber Other Clinician: Referring Provider: Ranae Plumber Treating Provider/Extender: Skipper Cliche in Treatment: 78 Constitutional Well-nourished and well-hydrated in no acute distress. Respiratory normal breathing without difficulty. Psychiatric this patient is able to make decisions and demonstrates good insight into disease process. Alert and Oriented x 3. pleasant and cooperative. Notes Upon inspection patient's wound bed showed signs of good granulation and epithelization at this point. Fortunately I do not see any evidence of active infection locally or systemically which is great I do not think there is any need for sharp debridement either. She does look to be a little bit more swollen to me today which is my one concern other than this however I think she is doing quite well. Electronic Signature(s) Signed: 12/12/2021 1:33:24 PM By: Worthy Keeler PA-C Entered By: Worthy Keeler on 12/12/2021 13:33:23 Soo, [name] ([id number]) -------------------------------------------------------------------------------- Physician Orders Details Patient Name: [name] Date of Service: 12/12/2021 1:00 PM Medical Record Number: [id number] Patient Account Number: [id number] Date of Birth/Sex: Nov 21, 1955 (66 y.o. F) Treating RN: [name] Dredge Primary Care Provider: Ranae Plumber Other Clinician: Referring Provider: Ranae Plumber Treating Provider/Extender: Skipper Cliche in Treatment: 19 Verbal / Phone Orders: No Diagnosis Coding ICD-10 Coding Code Description Q82.0 Hereditary lymphedema L97.811 Non-pressure chronic ulcer of other part of right lower leg limited to breakdown of skin L97.821 Non-pressure chronic ulcer of other part of left lower leg limited to breakdown of skin Follow-up Appointments o Return Appointment in 1 week. o Nurse Visit as needed Bathing/ Shower/ Hygiene o Clean wound with Normal Saline or wound cleanser. o Wash wounds with antibacterial soap and water. o May shower; gently cleanse wound with antibacterial soap, rinse  and pat dry prior to dressing wounds o No tub bath. Edema Control - Lymphedema / Segmental Compressive Device / Other o Ace wraps o Elevate, Exercise Daily and Avoid Standing for Long Periods of Time. o Elevate leg(s) parallel to the floor when sitting. o DO YOUR BEST to sleep in the bed at night. DO NOT sleep in your recliner. Long hours of sitting in a recliner leads to swelling of the legs and/or potential wounds on your backside. o Other: - use lymphedema pumps Non-Wound Condition Bilateral Lower Extremities o Cleanse affected area with antibacterial soap and water, o Apply appropriate compression. - ace wrap o Additional non-wound orders/instructions: - silver cell to open weeping areas with abd pad Wound Treatment Wound #10 - Lower Leg Wound Laterality: Left, Medial, Anterior Cleanser: Normal Saline 1 x Per Day/30 Days Discharge Instructions: Wash your hands with soap and water. Remove old dressing, discard into plastic bag and place into trash. Cleanse the wound with Normal Saline prior to applying a clean dressing using gauze sponges, not tissues or cotton balls. Do not scrub or use excessive force. Pat dry using gauze sponges, not tissue or cotton balls. Cleanser: Soap and Water 1 x Per Day/30 Days Discharge Instructions: Gently cleanse wound with antibacterial soap, rinse and pat dry prior to dressing wounds Primary Dressing: Silvercel  4 1/4x 4 1/4 (in/in) 1 x Per Day/30 Days Discharge Instructions: Apply Silvercel 4 1/4x 4 1/4 (in/in) as instructed Secondary Dressing: ABD Pad 5x9 (in/in) (Generic) 1 x Per Day/30 Days Discharge Instructions: Cover with ABD pad Secondary Dressing: Kerlix 4.5 x 4.1 (in/yd) (Generic) 1 x Per Day/30 Days Discharge Instructions: Apply Kerlix 4.5 x 4.1 (in/yd) as instructed Secured With: ACE WRAP - 95M ACE Elastic Bandage With VELCRO Brand Closure, 4 (in) 1 x Per Day/30 Days Secured With: Medipore Tape - 95M Medipore H Soft Cloth  Surgical Tape, 2x2 (in/yd) 1 x Per Day/30 Days Secured With: chucks pad for drainage PRN 1 x Per Day/30 Days ROSEALEE, RECINOS ([id number]) Electronic Signature(s) Unsigned Entered By: [name] Dredge on 12/12/2021 13:22:49 Signature(s): Date(s): [name] ([id number]) -------------------------------------------------------------------------------- Problem List Details Patient Name: PRERANA, STRAYER C. Date of Service: 12/12/2021 1:00 PM Medical Record Number: [id number] Patient Account Number: [id number] Date of Birth/Sex: 12/02/1955 (66 y.o. 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 Date of Service: 12/12/2021 1:00 PM Medical Record Number: [id number] Patient Account Number: [id number] Date of Birth/Sex: 28-Nov-1955 (66 y.o. F) Treating RN: [name] Dredge Primary Care Provider: Ranae Plumber Other Clinician: Referring Provider: Ranae Plumber Treating Provider/Extender: Skipper Cliche in Treatment: 19 Subjective Chief Complaint Information obtained from Patient Bilateral LE lymphedema with Left LE Ulcers History of Present Illness (HPI) The patient is a  66 year old female with history of hypertension and a long-standing history of bilateral lower extremity lymphedema (first presented on 4/2) . She has had open ulcers in the past which have always responded to compression therapy. She had briefly been to a lymphedema clinic in the past which helped her at the time. this time around she stopped treatment of her lymphedema pumps approximately 2 weeks ago because of some pain in the knees and then noticed the right leg getting worse. She was seen by her PCP who put her on clindamycin 4 times a day 2 days ago. The patient has seen [facility] and Dr. Delana Meyer had seen her last year where a vascular study including venous and arterial duplex studies were within normal limits. he had recommended compression stockings and lymphedema pumps and the patient has been using this in about 2 weeks ago. She is known to be diabetic but in the past few time she's gone to her primary care doctor her hemoglobin A1c has been normal. 02/11/2015 - after her last visit she took my advice and went to the ER regarding the progressive cellulitis of her right lower extremity and she was admitted between July 17 and 22nd. She received IV antibiotics and then was sent home on a course of steroid-induced and oral antibiotics. She has improved much since then. 02/17/2015 -- she has been doing fine and the weeping of her legs has remarkably gone down. She has no fresh issues. READMISSION 01/15/18  This patient was given this clinic before most recently in 2016 seen by Dr. Con Memos. She has massive bilateral lymphedema and over the last 2 months this had weeping edema out of the left leg. She has compression pumps but her compliance with these has been minimal. She has [facility] they've been using TCA/ABDs/kerlix under an Ace wrap.she has had recent problems with cellulitis. She was apparently seen in the ER and 12/23/17 and given clindamycin. She was then followed by her primary  doctor and given doxycycline and Keflex. The pain seems to have settled down. In April 2018 the patient had arterial studies done at [facility]. This showed triphasic waveforms throughout the right leg and mostly triphasic waveforms on the left except for monophasic at the posterior tibial artery distally. She was not felt to have evidence of right lower extremity arterial stenosis or significant problems on the left side. 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 She also had a right lower extremity venous Doppler in January 2018 this was limited by the patient's body habitus and lymphedema. Most of the proximal veins were not visualized The patient presents with an area of denuded skin on the anterior medial part of the left calf. There is weeping edema fluid here. 01/22/18; the patient has somewhat better edema control using her compression pumps twice a day and as a result she has much better epithelialization on the left anterior calf area. Only a small open area remains. 01/29/18; the patient has been compliant with her compression pumps. Both the areas on her calf that healed. The remaining area on the left anterior leg is fully epithelialized Readmission: 02/20/2019 upon evaluation today patient presents for reevaluation due to issues that she is having with the bilateral lower extremities. She actually has wounds open on both legs. On the right she has an area in the crease of her leg on the right around the knee region which is actually draining quite a bit and actually has some fungal type appearance to it. She has been on nystatin powder that seems to have helped to some degree. In regard to the left lower extremity this is actually in the lower portion of her leg closer to the ankle and again is continuing to drain as well unfortunately. There does not appear to be any signs of active infection at this time which is good news. No fevers, chills,  nausea, vomiting, or diarrhea. She tells me that since she was seen last year she is actually been doing quite well for the most part with regard to her lower extremities. Unfortunately she now is experiencing a little bit more drainage at this time. She is concerned about getting this under control so that it does not get significantly worse. 02/27/2019 on evaluation today patient appears to be doing somewhat better in regard to her bilateral lower extremity wounds. She has been tolerating the dressing changes without complication. Fortunately there is no signs of active infection at this point. No fevers, chills, nausea, vomiting, or diarrhea. She did get her dressing supplies which is excellent news she was extremely excited to get these. She also got paperwork from [employer] for their financial assistance program where they may be able to help her out in the future if needed with supplies at discounted prices. 03/06/2019 on evaluation today patient appears to be doing a little worse with regard to both areas of weeping on her bilateral lower extremities. This is around the right medial  knee and just above the left ankle. With that being said she is unfortunately not doing as well as I would like to see. I feel like she may need to potentially go see someone at the lymphedema clinic as the wraps that she needs or even beyond what we can do here at the wound care center. She really does not have wounds she just has open areas of weeping that are causing some difficulty for her. Subsequently because of this and the moisture I am concerned about the potential for infection I am going to likely give her a prophylactic antibiotic today, Keflex, just to be on the safe side. Nonetheless again there is no obvious signs of active infection at this time. EARMA, NICOLAOU ([id number]) 03/13/2019 on evaluation today patient appears to be doing well with regard to her bilateral lower extremities where she has been  weeping compared to even last week's evaluation. I see some areas of new skin growth which is excellent and overall I am very pleased with how things seem to be progressing. No fevers, chills, nausea, vomiting, or diarrhea. 03/20/2019 on evaluation today patient unfortunately is continuing to have issues with significant edema of the left lower extremity. Her right side seems to be doing much better. Unfortunately her left side is showing increased weeping of the lower portion of her leg. This is quite unfortunate obviously we were hoping to get her into the lymphedema clinic they really do not seem to when I see her how if she is draining. Despite the fact this is really not wound related but more lymphedema weeping related. Nonetheless I do not know that this can be helpful for her to even go for that appointment since again I am not sure there is much that they would actually do at this point. We may need to try a 4 layer compression wrap as best we can on her leg. She is on the Augmentin currently although I am still concerned about whether or not there could be potentially something going on infection wise I would obtain a culture though I understand is not the best being that is a surface culture I just 1 to make sure I do not seem to be missing anything. 03/27/2019 on evaluation today patient appears to be doing much better in regard to the left lower extremity compared to last week. Last week she had tremendous weeping which I think was subsequent to infection now she seems to be doing much better and very pleased. This is not completely healed but there is a lot of new skin growth and it has dried out quite a bit. Overall I think that we are doing well with how things are moving along at this time. No fevers, chills, nausea, vomiting, or diarrhea. 04/03/2019 evaluation today patient appears to be doing a little worse this week compared to last time I saw her. I think this may be due to the fact  that she is having issues with not being able to sleep in her bed at least not until last night. She is therefore been in a lift chair and subsequently has also had issues with not been able to use her pumps since she could not get in bed. With that being said the patient overall seems to be doing okay I do think I may want extend the antibiotic for a little bit longer at least until we can see if her edema and her weeping gets better and if it is then  obviously I can always discontinue the antibiotics as of next week however I want her to continue to have it over the next week. 04/10/2019 on evaluation today patient unfortunately is still doing poorly with regard to her left lower extremity. Her right is all things considering doing fairly well. On the left however she continues to have spreading of the area of infection and weeping which appears to be even a larger surface area than noted last week. She did have a positive culture for Pseudomonas in particular which seems to have been of concern she still has green/yellow discharge consistent with Pseudomonas and subsequently a tremendous amount of it. This has me obviously still concerned about the infection not really clearing up despite the fact that on culture it appears the Cipro should have been a good option for treating this. I think she may at this point need IV antibiotics since things are not doing better I do not want to get worse and cause sepsis. She is in agreement with the plan and believes as well that she likely does need to go to the hospital for IV vancomycin. Or something of the like depending on what the recommendation is from the ER. 04/17/2019 on evaluation today patient appears to be doing excellent in regard to her lower extremity on the left. She was in the hospital for several days from when I sent her last we saw her until just this past Tuesday. Fortunately her drainage is significantly improved and in fact is mostly  clear. There is just a couple small areas that may still drain a little bit she states that the [facility] they prescribed for her at discharge she went picked up from pharmacy and got home but has not been able to find it since. She is looked everywhere. She is wondering if I will replace that for her today I will be more than happy to do that. 05/01/2019 on evaluation today patient actually appears to be doing quite well with regard to her lower extremities. She occasionally is having areas that will leak and then heal up mainly when a piece of the fibrotic skin pops off but fortunately she is not having any signs of active infection at this time. Overall she also really does not have any obvious weeping at this time. I do believe however she really needs some compression wraps and I think this may be a good time to get her back to the lymphedema clinic. 05/11/2019 on evaluation today patient actually appears to be doing quite well with regard to her bilateral lower extremities. She occasionally will have a small area that we per another but in general seems to be completely healed which is great news. Overall very pleased with how everything seems to be progressing. She does have her appointment with lymphedema clinic on November 18. 05/25/2019 on evaluation today patient appears to be doing well with regard to her left lower extremity. I am very pleased in this regard. In regard to her right leg this actually did start draining more I think it is mainly due to the fact that her leg is more swollen. I am not seeing any obvious signs of infection at this time although that is definitely something were obviously acutely aware of simply due to the fact that she had an issue not too far back with exactly this issue. Nonetheless I do feel like that lymphedema clinic would still be beneficial for her. I explained obviously if they are not able to do  anything treatment wise on the right leg we could at least  have them treat her left leg and then proceed from there. The patient is really in agreement with that plan. If they are able to do both as the drainage slows down that I would be happy to let them handle both. 06/01/2019 on evaluation today patient unfortunately appears to be doing worse with regard to her right lower extremity. The left lower extremity is still maintaining at this point. Unfortunately she has been having significantly increased pain over the past several days and has been experiencing as well increased swelling of the right lower extremity. I really do not know that I am seeing anything that appears to be obvious for infection at this point to be peripherally honest. With that being said the patient does seem to be having much more swelling that she is even experienced in the past and coupled with increased pain in her hip as well I am concerned that again she could potentially have a DVT although I am not 100% sure of this. I think it something that may need to be checked out. We discussed the possibility of sending her for a DVT study through the hospital but unfortunately transportation is an issue if she does have a DVT I do not want her to wait days to be able to get in for that test however if she has this scheduled as an outpatient that is as fast that she will be able to get the test scheduled for transportation purposes. That will also fall on Thanksgiving so subsequently she did actually be looking at either Friday or even next week before we would know anything back from this. That is much too long in my opinion. Subsequent to the amount of discomfort she is experiencing the patient is actually okay with going to the ER for evaluation today. 06/12/2019 on evaluation today patient actually appears to be doing significantly better compared to last time I saw her. Following when I last saw her she was actually in the hospital from that Monday until the following Sunday almost  1 full week. She actually was placed on Keflex in the hospital following the time for her to be discharged and Dr. Steva Ready has recommended 2 times a day dosing of the Keflex for the next year in order to help with more prophylactic/preventative measures with regard to her developing cellulitis. Overall I think this sounds like an excellent plan. The patient unfortunately is good to have trouble being treated at lymphedema clinic due to the fact that she really cannot get up on the bed that they have there. They also state that they cannot manage her as long as she has anything draining at this point. Obviously that is somewhat unfortunate as she does need help with edema control but nonetheless we will have to do what we can for her outside of it sounds like the lymphedema clinic scenario at this point. 06/19/2019 on evaluation today patient appears to be doing fairly well with regard to her bilateral lower extremities. She is not nearly as swollen Bendavid, Haani C. ([id number]) and shows no signs of infection at this point. There is no evidence of cellulitis whatsoever. She also has no open wounds or draining at this point which is also good news. No fever chills noted. She seems to be in very good spirits and in fact appears to be doing quite well. READMISSION 11/27/2019 This is a 66 year old woman that we have had in this  clinic several times before including 2015, 16 and 19 and then most recently from 03/20/2019 through 06/19/2019 with bilateral lower extremity lymphedema. She has had previous arterial and reflux studies done years ago which were not all that remarkable. 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 She does have a positive family history and she had large legs starting may be in her 38s. She was recently in hospital from 10/20/2019 through 10/28/2019 with right leg cellulitis. She was given Ancef and clindamycin and then Zosyn  when a culture showed Pseudomonas. At that time there was purulent drainage. She was followed by infectious disease Dr Steva Ready. The patient is now back at home. She has noted increased swelling in the right and no drainage in her right leg mostly on the posterior medial aspect in the calf area. She has not had pain or fever. She has literally been improved lysing above dressings because her at the area of this is far too large for standard compression. She has been wrapping the areas with sheets to resorptive pads. She is found these helped somewhat. She does have an appointment with the lymphedema clinic in [place] in late June. Past medical history includes bilateral lymphedema, hypertension, obstructive sleep apnea with CPAP. Recent hospitalization with apparently Pseudomonas cellulitis of the right lower leg 12/15/2019 upon evaluation today patient appears to be doing a little bit worse in regard to her right lower extremity. Unfortunately she is having more weeping down in the lower portion of her leg. Fortunately there is no signs of active infection at this time. No fever chills noted. The patient states she is not having increased pain except for when she attempted to use the lymphedema pumps unfortunately she states that she did have pain when she did this. Otherwise we been using absorptive dressings of one type or another she is using diapers at home and then subsequently Ace wraps. In regard to the barrier cream we have discussed the possibility of derma cloud which she would like to try I do not have a problem with that. 12/22/2019 upon evaluation today patient actually appears to be doing better in regard to her leg ulcers at this point. Fortunately there does not appear to be any signs of active infection which is great news and I am extremely pleased with where things are progressing at this time. There is no sign of active infection currently. The patient is very pleased to see  things doing so well. 12/29/2019 upon evaluation today patient appears to be doing a little bit better in regard to her weeping in general over her lower extremities. She does have some signs of mild erythema little bit more than what I noted last week or rather last visit. Nonetheless I think that my threshold for switching her antibiotics from Keflex to something else is very low at this point considering that she has had such severe infections in the past that seem to come almost out of nowhere. There is a little erythema and warmth noted of the lower portion of her leg compared to the upper which also makes me want to go ahead and address things more rapidly at this point. Likely I would switch out the Keflex for something like Levaquin ideally. 7/16; patient with severe bilateral lymphedema. She has superficial wounds albeit almost circumferential now on the left lateral lower leg. This may be new from last time. Small area on the right anterior lower leg and then another  area on the right medial lower leg and of pannus fold. She has been using various absorptive garments. She states she is using her compression pumps once a day occasionally twice. Culture from her last visit here was negative 01/29/2020 on evaluation today patient appears to be doing excellent at this point in regard to her legs with regard to infection I see no signs of active infection at this point. She still does have unfortunately areas of weeping this is minimal on the right now her left is actually significantly worse although I do not think it is as bad as last week with Dr. Dellia Nims saw her. She has been trying to pump and elevate her legs is much as possible. She has previously been on the Keflex and in the past for prevention that seems to do fairly well and likely can extend that today. 02/04/2020 on evaluation today patient appears to be doing better in regard to her legs bilaterally. Fortunately there is no signs of  active infection at this time which is great news and overall she has less weeping on the left compared to the right and there is several spots where she is pretty much sealed up with no draining regions. Overall very pleased in this regard. 02/19/2020 on evaluation today patient appears to be doing very well in regard to her wounds currently. Fortunately there is no evidence of active infection overall very pleased with where things stand. She is significantly improved in regard to her edema I am extremely pleased in this regard she tells me that the popping no longer hurts and in fact she actually looks forward to it. 03/04/2020 on evaluation today patient appears to be doing excellent in regard to her lower extremities. Fortunately there is no signs of active infection at this time. No fevers, chills, nausea, vomiting, or diarrhea. 03/25/2020 on evaluation today patient appears to be doing a little bit more poorly in regard to her legs at this point. She tells me that she is still continue to have issues with drainage and this has been a little bit worse she was getting ready to start taking the Keflex again but wanted to see me first. Fortunately there is no signs of active infection at this time. No fevers, chills, nausea, vomiting, or diarrhea. 04/15/2020 upon evaluation today patient appears to be doing somewhat poorly in regard to her right leg. She tells me she has been having more pain she has been taking the Keflex that was previously prescribed unfortunately that just does not seem to help with this. She was hoping that the pain on her right was actually coming from the fact that she was having issues with her wrap having gotten caught in her recliner. With that being said she tells me that she knew something was not right. Currently her right leg is warm to touch along with being erythematous all the way up to around at least mid thigh as far as I can see. The left leg does not appear to be  doing that badly though there is increased weeping around the ankle region. 05/06/2020 on evaluation today patient appears to be doing much better than last time I saw her. She did go to the hospital where she was admitted for 2 days and treated with antibiotic therapy. She was discharged with antibiotics as well and has done extremely well. I am extremely pleased with where things stand today. There is no signs of active infection at this time which is great news.  05/27/2020 upon evaluation today patient appears to be doing well with regard to her lower extremities bilaterally. She has just a very tiny area on the right leg which is opening on the left leg she is significantly improved though she still has several areas that do appear to be open this is minimal compared to what is been in the past. In general I am extremely pleased with where things stand today. The patient does tell me she is not been using her pumps quite as much as she should be. I do believe that is 1 area she can definitely work on. She has had a lot going on including a Covid exposure and apparently also a outbreak of likely shingles. SEAIRA, BYUS ([id number]) 06/24/2020 upon evaluation today patient appears to be doing well with regard to her legs in general although the left leg unfortunately is showing some signs of erythema she does have a little bit of increased weeping and to be honest I am concerned about infection here. I discussed that with her today and I think that we may need to address this sooner rather than later she has been taking Keflex she is not really certain that is been making a big improvement however. No fevers, chills, nausea, vomiting, or diarrhea. 06/30/2020 upon evaluation today patient's legs actually seem to be doing better in my opinion as compared to where they were last week. Fortunately there does not appear to be any signs of active infection. Her culture showed multiple organisms  nothing predominate. With that being said the Levaquin seems to have done well I think she has improved since I last saw her as well. 07/14/2020 upon evaluation today patient appears to be doing actually better in regard to her lower extremities in my opinion. She has been tolerating the dressing changes without complication. Fortunately there is no signs of active infection at this time. 08/04/2020 upon evaluation today patient appears to be doing excellent in regard to her leg ulcers. Fortunately she has very little that is open at this point. This is great news. In fact I think that the Gold[employer] medicated powder has been excellent for her. It seems to have done the trick where we had tried several other things without as much success. Fortunately there is no evidence of active infection at this time. No fevers, chills, nausea, vomiting, or diarrhea. 09/01/2020 upon evaluation today patient appears to be doing a little bit more poorly than the last time I saw her. She tells me right now that she has been having a lot of drainage compared to where things were previous which has unfortunately led to more irritation as well. She is concerned that this is leading to infection. Again based on what I am seeing today as well I am also concerned of the same to be honest. 09/15/2020 upon evaluation today patient appears to be doing well with regard to her legs compared to what I saw previous. Fortunately there does not appear to be any evidence of active infection at this time which is great news. At least not as badly as what it was previous. With that being said I do believe that the patient does need to have an extension of the antibiotics. This I believe will actually help her more in the way of making sure this stays under control and does not worsen. That was the Bactrim DS. Readmission: 08/01/2021 upon evaluation today patient appears to be doing actually pretty well all things considered. Is actually been  almost a year since have seen her last March. Fortunately I do not see any evidence of active infection locally nor systemically at this point. She does have a couple open areas on the left leg the right leg appears to be doing quite well. In general she has been in the hospital 3 times since I saw her a year ago all 4 cellulitis type issues except for the last 1 which was actually more related to congestive heart failure for that reason she is not using her lymphedema pumps at this point and that is probably the best idea. 08/08/2021 upon evaluation today patient appears to be doing well with regard to her legs all things considered her left leg may be a little bit more swollen based on what I see today. Fortunately I do not see any signs of active infection locally nor systemically at this time which is great news. No fevers, chills, nausea, vomiting, or diarrhea. 08/15/2021 upon evaluation today patient actually appears to be doing excellent in regard to her wounds. She has been tolerating the dressing changes without complication. Fortunately I see no evidence of infection currently which is great news. No fevers, chills, nausea, vomiting, or diarrhea. 08/29/2021 upon evaluation patient appears to be doing excellent she in fact is almost completely healed. I am actually very pleased with where we stand today and I think that she is making wonderful progress she just has a small area of weeping on the right lateral leg everything else is pretty much completely healed which is also. 09/05/2021 upon evaluation today patient appears to be doing well with regard to her legs. She does have a small area of weeping on the right leg and there is a small area on the left leg as well. It is potentially something that may need to be addressed in the future more specifically but right now I think that there may have just been a little drainage here on the left. With all that being said I think that this still does  not appear to be infected and overall I feel like she is doing quite well. That something we always have to keep a very close eye on as she can change very rapidly from okay to not okay. 09/12/2021 upon evaluation today patient appears to be doing a little bit worse in regard to her leg and actually somewhat concerned about the possibility of infection here. I discussed that with the patient. For that reason I am going to go ahead and see about getting her set up for a repeat prescription for the Bactrim. She is in agreement with that plan. 3/14; patient presents for follow-up. She has been using silver alginate with dressing changes. She is still taking Bactrim prescribed at last clinic visit. She has no issues or complaints today. She has lymphedema pumps however does not use these. 09/26/2020 upon evaluation today patient appears to actually be doing well in regard to her right leg I am pleased in that regard. Unfortunately she has new areas on her left leg that are open at this point that I do need to be addressed. I am also concerned about the warmth around one of the wounds on the more anterior side. I think this is something that we may need to culture today potentially requiring antibiotics going forward. 10/03/2021 upon evaluation today patient unfortunately is not doing nearly as well as she was even last week with her wounds. She is having some issues here with increased cellulitis of  the left lower extremity unfortunately. I did prescribe Augmentin for her eczema prescribed last week unfortunately she never actually received this prescription. The pharmacy stated they never got it. We double checked with them today they still did not receive it. I am not sure what happened in that regard. With that being said the patient is in good spirits today she does not appear to be as sick as where she normally is when the cellulitis gets significantly worse as it has in the past. Nonetheless the leg is  much more red not just around the ankle where I was seeing at last week on Tuesday but rather now this is going all the way up to almost her knee and is definitely hot to touch compared to the right leg. Nonetheless I feel like she does need to go to the ER for likely IV antibiotics. 10-10-2021 upon evaluation today patient appears to be doing well with regard to her wound. She has been tolerating the dressing changes that appears to be doing much better. After I saw her last week she did go to the ER they admitted her to the hospital and gave her IV antibiotics she fortunately is doing significantly better. Overall I am extremely pleased with where things stand today. 10-17-2021 upon evaluation today patient appears to be doing well with regard to her wound. In fact this is looking better and her leg is looking better but she still does have some areas here of erythema. We will continue to address this as soon as possible in my opinion. I do believe she may require some debridement and what appears to be a more defined wound on her left leg at this point. 10-24-2021 upon evaluation patient is definitely showing signs of improvement which is great news. I do not see any evidence of active infection [place], [place]. ([id number]) locally or systemically which is great news as well. No fevers, chills, nausea, vomiting, or diarrhea. 10-31-2021. Upon evaluation today patient's leg actually feels better from an infection and wound care perspective. I am actually very pleased in this regard. Unfortunately the biggest issue that I see at this time is that the patient is having a significant issue with her breathing. I did put her on the pulse ox machine to check her oxygen saturation and it was pretty much at 100% which is good but she tells me that when she is walking this is much more difficult for her. She also tells me that when she is trying to bend over to perform her dressing changes she is so short of  breath due to the swelling and fluid in her abdominal area that she is just not been able to do this. She is tearful and very concerned today to be honest. I am truly sorry to see her this way I know she is really having a hard time at the moment. 11-14-2021 upon evaluation patient actually appears to be doing significantly better compared to when I last saw her. She was actually admitted to the hospital I believe this for 6 days total after I sent her on 10-31-2021. Subsequently they actually pulled off greater than 50 pounds of fluid she tells me she feels significantly better her legs are not as tight she actually has some play in the tissue and it is obvious that she is doing much better just from a mobility standpoint as well as a breathing standpoint. Overall I am extremely happy for her and how she is doing the leg  also looks much better on the left. 11-21-2021 upon evaluation today patient appears to be doing well although it does appear that she is going require some sharp debridement the wound is very dry this is the opposite of what its been she was having so much weeping that it was staying extremely wet. Nonetheless we do need to see about going ahead and debriding the wound today. 11-28-2021 upon evaluation today patient appears to be doing well with regard to her wound I definitely see signs of things continuing to improve which is great news. I do not see any evidence of active infection locally or systemically also great news. 12-05-2021 upon evaluation today patient appears to be doing well with regard to her wound. She has been tolerating the dressing changes. Fortunately there does not appear to be any signs of active infection locally or systemically at this time. No fevers, chills, nausea, vomiting, or diarrhea. 12-12-2021 upon evaluation patient appears to be doing well with regard to her wound this is actually measuring smaller and looking much better. Fortunately I do not see any  signs of active infection locally or systemically at this time which is excellent news. Objective Constitutional Well-nourished and well-hydrated in no acute distress. Vitals Time Taken: 1:02 PM, Height: 63 in, Weight: 372 lbs, BMI: 65.9, Temperature: 98 F, Pulse: 73 bpm, Respiratory Rate: 18 breaths/min, Blood Pressure: 115/59 mmHg. Respiratory normal breathing without difficulty. Psychiatric this patient is able to make decisions and demonstrates good insight into disease process. Alert and Oriented x 3. pleasant and cooperative. General Notes: Upon inspection patient's wound bed showed signs of good granulation and epithelization at this point. Fortunately I do not see any evidence of active infection locally or systemically which is great I do not think there is any need for sharp debridement either. She does look to be a little bit more swollen to me today which is my one concern other than this however I think she is doing quite well. Integumentary (Hair, Skin) Wound #10 status is Open. Original cause of wound was Gradually Appeared. The date acquired was: 10/17/2021. The wound has been in treatment 8 weeks. The wound is located on the Left,Medial,Anterior Lower Leg. The wound measures 5cm length x 8cm width x 0.1cm depth; 31.416cm^2 area and 3.142cm^3 volume. There is Fat Layer (Subcutaneous Tissue) exposed. There is no tunneling or undermining noted. There is a medium amount of serosanguineous drainage noted. There is medium (34-66%) red granulation within the wound bed. There is a small (1-33%) amount of necrotic tissue within the wound bed including Adherent Slough. 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 ([id number]) Plan Follow-up Appointments: Return Appointment in 1 week. Nurse Visit as needed Bathing/ Shower/ Hygiene: Clean wound with Normal Saline or wound cleanser. Wash wounds with antibacterial soap and water. May shower; gently cleanse wound with antibacterial soap, rinse and pat dry prior to dressing wounds No tub bath. Edema Control - Lymphedema / Segmental Compressive Device / Other: Ace wraps Elevate, Exercise Daily and Avoid Standing for Long Periods of Time. Elevate leg(s) parallel to the floor when sitting. DO YOUR BEST to sleep in the bed at night. DO NOT sleep in your recliner. Long hours of sitting in a recliner  leads to swelling of the legs and/or potential wounds on your backside. Other: - use lymphedema pumps Non-Wound Condition: Cleanse affected area with antibacterial soap and water, Apply appropriate compression. - ace wrap Additional non-wound orders/instructions: - silver cell to open weeping areas with abd pad WOUND #10: - Lower Leg Wound Laterality: Left, Medial, Anterior Cleanser: Normal Saline 1 x Per Day/30 Days Discharge Instructions: Wash your hands with soap and water. Remove old dressing, discard into plastic bag and place into trash. Cleanse the wound with Normal Saline prior to applying a clean dressing using gauze sponges, not tissues or cotton balls. Do not scrub or use excessive force. Pat dry using gauze sponges, not tissue or cotton balls. Cleanser: Soap and Water 1 x Per Day/30 Days Discharge Instructions: Gently cleanse wound with antibacterial soap, rinse and pat dry prior to dressing wounds Primary Dressing: Silvercel 4 1/4x 4 1/4 (in/in) 1 x Per Day/30 Days Discharge Instructions: Apply Silvercel 4 1/4x 4 1/4 (in/in) as instructed Secondary Dressing: ABD Pad 5x9 (in/in) (Generic) 1 x Per Day/30 Days Discharge Instructions: Cover with ABD pad Secondary Dressing: Kerlix 4.5 x 4.1 (in/yd) (Generic) 1 x Per Day/30  Days Discharge Instructions: Apply Kerlix 4.5 x 4.1 (in/yd) as instructed Secured With: ACE WRAP - 67M ACE Elastic Bandage With VELCRO Brand Closure, 4 (in) 1 x Per Day/30 Days Secured With: Medipore Tape - 67M Medipore H Soft Cloth Surgical Tape, 2x2 (in/yd) 1 x Per Day/30 Days Secured With: chucks pad for drainage PRN 1 x Per Day/30 Days 1. I did discuss with the patient that she needs to try to elevate her legs more to try to keep the edema under control in this regard. She voiced understanding. 2. I am also can recommend at this point that we have the patient continue to monitor for any signs of worsening or infection. Obviously if anything changes she should let me know but right now for likely really doing quite well. 3. We will continue with the silver alginate as well as the rolled gauze and Ace wrap to secure in place. We will see patient back for reevaluation in 1 week here in the clinic. If anything worsens or changes patient will contact our office for additional recommendations. Electronic Signature(s) Signed: 12/12/2021 1:34:37 PM By: Worthy Keeler PA-C Entered By: Worthy Keeler on 12/12/2021 13:34:37 Raffety, [name] ([id number]) -------------------------------------------------------------------------------- SuperBill Details Patient Name: [name] Date of Service: 12/12/2021 Medical Record Number: [id number] Patient Account Number: [id number] Date of Birth/Sex: 03/19/1956 (66 y.o. F) Treating RN: [name] Dredge Primary Care Provider: Ranae Plumber Other Clinician: Referring Provider: Ranae Plumber Treating Provider/Extender: Skipper Cliche in Treatment: 19 Diagnosis Coding ICD-10 Codes Code Description Q82.0 Hereditary lymphedema L97.811 Non-pressure chronic ulcer of other part of right lower leg limited to breakdown of skin L97.821 Non-pressure chronic ulcer of other part of left lower leg limited to breakdown of skin Physician Procedures CPT4 Code:  [id number] Description: 98338 - WC PHYS LEVEL 3 - EST PT Modifier: Quantity: 1 CPT4 Code: Description: ICD-10 Diagnosis Description Q82.0 Hereditary lymphedema L97.811 Non-pressure chronic ulcer of other part of right lower leg limited to bre L97.821 Non-pressure chronic ulcer of other part of left lower leg limited to brea Modifier: akdown of skin kdown of skin Quantity: Electronic Signature(s) Signed: 12/12/2021 1:34:52 PM By: Worthy Keeler PA-C Entered By: Worthy Keeler on 12/12/2021 13:34:51

## 2021-12-12 NOTE — Progress Notes (Signed)
[name], [name] ([id number]) Visit Report for 12/12/2021 Arrival Information Details Patient Name: [name], [name]. Date of Service: 12/12/2021 1:00 PM Medical Record Number: [id number] Patient Account Number: [id number] Date of Birth/Sex: August 14, 1955 (66 y.o. F) Treating RN: [name] Dredge Primary Care Skylier Kretschmer: Ranae Plumber Other Clinician: Referring Kenyatte Gruber: Ranae Plumber Treating Jolaine Fryberger/Extender: Skipper Cliche in Treatment: 19 Visit Information History Since Last Visit Added or deleted any medications: No Patient Arrived: [name] Any new allergies or adverse reactions: No Arrival Time: 13:01 Had a fall or experienced change in No Accompanied By: self activities of daily living that may affect Transfer Assistance: None risk of falls: Patient Identification Verified: Yes Hospitalized since last visit: No Secondary Verification Process Completed: Yes Has Dressing in Place as Prescribed: Yes Pain Present Now: Yes Electronic Signature(s) Signed: 12/12/2021 4:30:42 PM By: [name] Dredge Entered By: [name] Dredge on 12/12/2021 13:02:25 [name] ([id number]) -------------------------------------------------------------------------------- Clinic Level of Care Assessment Details Patient Name: [name]. Date of Service: 12/12/2021 1:00 PM Medical Record Number: [id number] Patient Account Number: [id number] Date of Birth/Sex: [birth year] (66 y.o. F) Treating RN: [name] Dredge Primary Care Eder Macek: Ranae Plumber Other Clinician: Referring Meliya Mcconahy: Ranae Plumber Treating Cait Locust/Extender: Skipper Cliche in Treatment: 19 Clinic Level of Care Assessment Items TOOL 4 Quantity Score '[]'$  - Use when only an EandM is performed on FOLLOW-UP visit 0 ASSESSMENTS - Nursing Assessment / Reassessment X - Reassessment of Co-morbidities (includes updates in patient status) 1 10 X- 1 5 Reassessment of Adherence to Treatment Plan ASSESSMENTS - Wound and Skin  Assessment / Reassessment X - Simple Wound Assessment / Reassessment - one wound 1 5 '[]'$  - 0 Complex Wound Assessment / Reassessment - multiple wounds '[]'$  - 0 Dermatologic / Skin Assessment (not related to wound area) ASSESSMENTS - Focused Assessment X - Circumferential Edema Measurements - multi extremities 1 5 '[]'$  - 0 Nutritional Assessment / Counseling / Intervention '[]'$  - 0 Lower Extremity Assessment (monofilament, tuning fork, pulses) '[]'$  - 0 Peripheral Arterial Disease Assessment (using hand held doppler) ASSESSMENTS - Ostomy and/or Continence Assessment and Care '[]'$  - Incontinence Assessment and Management 0 '[]'$  - 0 Ostomy Care Assessment and Management (repouching, etc.) PROCESS - Coordination of Care X - Simple Patient / Family Education for ongoing care 1 15 '[]'$  - 0 Complex (extensive) Patient / Family Education for ongoing care '[]'$  - 0 Staff obtains Programmer, systems, Records, Test Results / Process Orders '[]'$  - 0 Staff telephones HHA, Nursing Homes / Clarify orders / etc '[]'$  - 0 Routine Transfer to another Facility (non-emergent condition) '[]'$  - 0 Routine Hospital Admission (non-emergent condition) '[]'$  - 0 New Admissions / Biomedical engineer / Ordering NPWT, Apligraf, etc. '[]'$  - 0 Emergency Hospital Admission (emergent condition) X- 1 10 Simple Discharge Coordination '[]'$  - 0 Complex (extensive) Discharge Coordination PROCESS - Special Needs '[]'$  - Pediatric / Minor Patient Management 0 '[]'$  - 0 Isolation Patient Management '[]'$  - 0 Hearing / Language / Visual special needs '[]'$  - 0 Assessment of Community assistance (transportation, D/C planning, etc.) '[]'$  - 0 Additional assistance / Altered mentation '[]'$  - 0 Support Surface(s) Assessment (bed, cushion, seat, etc.) INTERVENTIONS - Wound Cleansing / Measurement Arboleda, Marcayla C. ([id number]) X- 1 5 Simple Wound Cleansing - one wound '[]'$  - 0 Complex Wound Cleansing - multiple wounds X- 1 5 Wound Imaging (photographs - any number  of wounds) '[]'$  - 0 Wound Tracing (instead of photographs) X- 1 5 Simple Wound Measurement - one wound '[]'$  - 0  Complex Wound Measurement - multiple wounds INTERVENTIONS - Wound Dressings X - Small Wound Dressing one or multiple wounds 1 10 '[]'$  - 0 Medium Wound Dressing one or multiple wounds '[]'$  - 0 Large Wound Dressing one or multiple wounds '[]'$  - 0 Application of Medications - topical '[]'$  - 0 Application of Medications - injection INTERVENTIONS - Miscellaneous '[]'$  - External ear exam 0 '[]'$  - 0 Specimen Collection (cultures, biopsies, blood, body fluids, etc.) '[]'$  - 0 Specimen(s) / Culture(s) sent or taken to Lab for analysis '[]'$  - 0 Patient Transfer (multiple staff / Civil Service fast streamer / Similar devices) '[]'$  - 0 Simple Staple / Suture removal (25 or less) '[]'$  - 0 Complex Staple / Suture removal (26 or more) '[]'$  - 0 Hypo / Hyperglycemic Management (close monitor of Blood Glucose) '[]'$  - 0 Ankle / Brachial Index (ABI) - do not check if billed separately X- 1 5 Vital Signs Has the patient been seen at the hospital within the last three years: Yes Total Score: 80 Level Of Care: New/Established - Level 3 Electronic Signature(s) Signed: 12/12/2021 4:30:42 PM By: [name] Dredge Entered By: [name] Dredge on 12/12/2021 13:38:13 Bartleson, [name] ([id number]) -------------------------------------------------------------------------------- Encounter Discharge Information Details Patient Name: [name]. Date of Service: 12/12/2021 1:00 PM Medical Record Number: [id number] Patient Account Number: [id number] Date of Birth/Sex: 22-May-1956 (66 y.o. F) Treating RN: [name] Dredge Primary Care Abbagail Scaff: Ranae Plumber Other Clinician: Referring Lewin Pellow: Ranae Plumber Treating Christe Tellez/Extender: Skipper Cliche in Treatment: 19 Encounter Discharge Information Items Discharge Condition: Stable Ambulatory Status: Walker Discharge Destination: Home Transportation: Private Auto Accompanied  By: self Schedule Follow-up Appointment: Yes Clinical Summary of Care: Electronic Signature(s) Signed: 12/12/2021 4:30:42 PM By: [name] Dredge Entered By: [name] Dredge on 12/12/2021 13:39:41 Pedraza, [name] ([id number]) -------------------------------------------------------------------------------- Lower Extremity Assessment Details Patient Name: [name]. Date of Service: 12/12/2021 1:00 PM Medical Record Number: [id number] Patient Account Number: [id number] Date of Birth/Sex: 11-02-1955 (66 y.o. F) Treating RN: [name] Dredge Primary Care Tyjai Charbonnet: Ranae Plumber Other Clinician: Referring Odilia Damico: Ranae Plumber Treating Maheen Cwikla/Extender: Skipper Cliche in Treatment: 19 Edema Assessment Assessed: [Left: No] [Right: No] Edema: [Left: Yes] [Right: Yes] Calf Left: Right: Point of Measurement: 39 cm From Medial Instep 81 cm 84 cm Ankle Left: Right: Point of Measurement: 9 cm From Medial Instep 58 cm 57.5 cm Vascular Assessment Pulses: Dorsalis Pedis Palpable: [Left:Yes] [Right:Yes] Electronic Signature(s) Signed: 12/12/2021 4:30:42 PM By: [name] Dredge Entered By: [name] Dredge on 12/12/2021 13:18:16 Loveall, [name] ([id number]) -------------------------------------------------------------------------------- Multi Wound Chart Details Patient Name: [name]. Date of Service: 12/12/2021 1:00 PM Medical Record Number: [id number] Patient Account Number: [id number] Date of Birth/Sex: 1955/07/27 (66 y.o. F) Treating RN: [name] Dredge Primary Care Kamariah Fruchter: Ranae Plumber Other Clinician: Referring Traci Plemons: Ranae Plumber Treating Tamila Gaulin/Extender: Skipper Cliche in Treatment: 19 Vital Signs Height(in): 65 Pulse(bpm): 22 Weight(lbs): 595 Blood Pressure(mmHg): 115/59 Body Mass Index(BMI): 65.9 Temperature(F): 98 Respiratory Rate(breaths/min): 18 Photos: [N/A:N/A] Wound Location: Left, Medial, Anterior Lower Leg N/A N/A Wounding Event:  Gradually Appeared N/A N/A Primary Etiology: Lymphedema N/A N/A Comorbid History: Cataracts, Chronic sinus N/A N/A problems/congestion, Anemia, Lymphedema, Asthma, Sleep Apnea, Tuberculosis, Angina, Hypertension, Gout, Osteoarthritis Date Acquired: 10/17/2021 N/A N/A Weeks of Treatment: 8 N/A N/A Wound Status: Open N/A N/A Wound Recurrence: No N/A N/A Measurements L x W x D (cm) 5x8x0.1 N/A N/A Area (cm) : 31.416 N/A N/A Volume (cm) : 3.142 N/A N/A % Reduction in Area: -300.00% N/A N/A % Reduction  in Volume: -300.30% N/A N/A Classification: Full Thickness Without Exposed N/A N/A Support Structures Exudate Amount: Medium N/A N/A Exudate Type: Serosanguineous N/A N/A Exudate Color: red, brown N/A N/A Granulation Amount: Medium (34-66%) N/A N/A Granulation Quality: Red N/A N/A Necrotic Amount: Small (1-33%) N/A N/A Exposed Structures: Fat Layer (Subcutaneous Tissue): N/A N/A Yes Epithelialization: Medium (34-66%) N/A N/A Assessment Notes: skin appears to have been wet, pt N/A N/A states she switched to silvercell Treatment Notes Electronic Signature(s) Signed: 12/12/2021 4:30:42 PM By: [name] Dredge Entered By: [name] Dredge on 12/12/2021 13:18:35 [name] ([id number]) -------------------------------------------------------------------------------- Multi-Disciplinary Care Plan Details Patient Name: [name]. Date of Service: 12/12/2021 1:00 PM Medical Record Number: [id number] Patient Account Number: [id number] Date of Birth/Sex: 05/22/1956 (66 y.o. F) Treating RN: [name] Dredge Primary Care Ausencio Vaden: Ranae Plumber Other Clinician: Referring Shelva Hetzer: Ranae Plumber Treating Tiombe Tomeo/Extender: Skipper Cliche in Treatment: 19 Active Inactive Wound/Skin Impairment Nursing Diagnoses: Impaired tissue integrity Knowledge deficit related to ulceration/compromised skin integrity Goals: Ulcer/skin breakdown will have a volume reduction of 30% by week  4 Date Initiated: 08/01/2021 Target Resolution Date: 08/29/2021 Goal Status: Active Ulcer/skin breakdown will have a volume reduction of 50% by week 8 Date Initiated: 08/01/2021 Target Resolution Date: 09/26/2021 Goal Status: Active Ulcer/skin breakdown will have a volume reduction of 80% by week 12 Date Initiated: 08/01/2021 Target Resolution Date: 10/24/2021 Goal Status: Active Ulcer/skin breakdown will heal within 14 weeks Date Initiated: 08/01/2021 Target Resolution Date: 11/07/2021 Goal Status: Active Interventions: Assess patient/caregiver ability to obtain necessary supplies Assess patient/caregiver ability to perform ulcer/skin care regimen upon admission and as needed Assess ulceration(s) every visit Provide education on ulcer and skin care Notes: Electronic Signature(s) Signed: 12/12/2021 4:30:42 PM By: [name] Dredge Entered By: [name] Dredge on 12/12/2021 13:18:28 Sanguinetti, [name] ([id number]) -------------------------------------------------------------------------------- Non-Wound Condition Assessment Details Patient Name: [name]. Date of Service: 12/12/2021 1:00 PM Medical Record Number: [id number] Patient Account Number: [id number] Date of Birth/Sex: 23-Sep-1955 (66 y.o. F) Treating RN: [name] Dredge Primary Care Mccartney Chuba: Ranae Plumber Other Clinician: Referring Emanuelle Hammerstrom: Ranae Plumber Treating Jovonte Commins/Extender: Skipper Cliche in Treatment: 19 Non-Wound Condition: Condition: Lymphedema Location: Leg Side: Right Notes: Right leg with decreased drainage this visit. Photos Notes no open area noted on right lower leg Electronic Signature(s) Signed: 12/12/2021 4:30:42 PM By: [name] Dredge Entered By: [name] Dredge on 12/12/2021 13:38:45 Rainey, [name] ([id number]) -------------------------------------------------------------------------------- Pain Assessment Details Patient Name: Elizabeth Landau C. Date of Service: 12/12/2021 1:00 PM Medical  Record Number: [id number] Patient Account Number: [id number] Date of Birth/Sex: [birth year] (66 y.o. F) Treating RN: [name] Dredge Primary Care Birdena Kingma: Ranae Plumber Other Clinician: Referring Dorsie Sethi: Ranae Plumber Treating Lakendra Helling/Extender: Skipper Cliche in Treatment: 19 Active Problems Location of Pain Severity and Description of Pain Patient Has Paino Yes Site Locations Rate the pain. Current Pain Level: 8 Pain Management and Medication Current Pain Management: Notes pt states pain in back Electronic Signature(s) Signed: 12/12/2021 4:30:42 PM By: [name] Dredge Entered By: [name] Dredge on 12/12/2021 13:05:32 Hippert, [name] ([id number]) -------------------------------------------------------------------------------- Patient/Caregiver Education Details Patient Name: [name]. Date of Service: 12/12/2021 1:00 PM Medical Record Number: [id number] Patient Account Number: [id number] Date of Birth/Gender: 1955-07-28 (66 y.o. F) Treating RN: [name] Dredge Primary Care Physician: Ranae Plumber Other Clinician: Referring Physician: Ranae Plumber Treating Physician/Extender: Skipper Cliche in Treatment: 19 Education Assessment Education Provided To: Patient Education Topics Provided Wound/Skin Impairment: Handouts: Caring for Your Ulcer Methods: Explain/Verbal Responses: State content correctly  Electronic Signature(s) Signed: 12/12/2021 4:30:42 PM By: [name] Dredge Entered By: [name] Dredge on 12/12/2021 13:39:06 Finau, [name] ([id number]) -------------------------------------------------------------------------------- Wound Assessment Details Patient Name: [name], [name] Date of Service: 12/12/2021 1:00 PM Medical Record Number: [id number] Patient Account Number: [id number] Date of Birth/Sex: [birth year] (66 y.o. F) Treating RN: [name] Dredge Primary Care Teylor Wolven: Ranae Plumber Other Clinician: Referring Ajna Moors: Ranae Plumber Treating Tremon Sainvil/Extender: Skipper Cliche in Treatment: 19 Wound Status Wound Number: 10 Primary Lymphedema Etiology: Wound Location: Left, Medial, Anterior Lower Leg Wound Open Wounding Event: Gradually Appeared Status: Date Acquired: 10/17/2021 Comorbid Cataracts, Chronic sinus problems/congestion, Anemia, Weeks Of Treatment: 8 History: Lymphedema, Asthma, Sleep Apnea, Tuberculosis, Angina, Clustered Wound: No Hypertension, Gout, Osteoarthritis Photos Wound Measurements Length: (cm) 5 Width: (cm) 8 Depth: (cm) 0.1 Area: (cm) 31.416 Volume: (cm) 3.142 % Reduction in Area: -300% % Reduction in Volume: -300.3% Epithelialization: Medium (34-66%) Tunneling: No Undermining: No Wound Description Classification: Full Thickness Without Exposed Support Struct Exudate Amount: Medium Exudate Type: Serosanguineous Exudate Color: red, brown ures Foul Odor After Cleansing: No Slough/Fibrino Yes Wound Bed Granulation Amount: Medium (34-66%) Exposed Structure Granulation Quality: Red Fat Layer (Subcutaneous Tissue) Exposed: Yes Necrotic Amount: Small (1-33%) Necrotic Quality: Adherent Slough Assessment Notes skin appears to have been wet, pt states she switched to silvercell Treatment Notes Wound #10 (Lower Leg) Wound Laterality: Left, Medial, Anterior Cleanser Normal Saline Discharge Instruction: Wash your hands with soap and water. Remove old dressing, discard into plastic bag and place into trash. Cleanse the wound with Normal Saline prior to applying a clean dressing using gauze sponges, not tissues or cotton balls. Do not scrub or use excessive force. Pat dry using gauze sponges, not tissue or cotton balls. MARVELOUS, WOOLFORD ([id number]) Soap and Water Discharge Instruction: Gently cleanse wound with antibacterial soap, rinse and pat dry prior to dressing wounds Peri-Wound Care Topical Primary Dressing Silvercel 4 1/4x 4 1/4 (in/in) Discharge Instruction:  Apply Silvercel 4 1/4x 4 1/4 (in/in) as instructed Secondary Dressing ABD Pad 5x9 (in/in) Discharge Instruction: Cover with ABD pad Kerlix 4.5 x 4.1 (in/yd) Discharge Instruction: Apply Kerlix 4.5 x 4.1 (in/yd) as instructed Secured With ACE WRAP - 69M ACE Elastic Bandage With VELCRO Brand Closure, 4 (in) Medipore Tape - 69M Medipore H Soft Cloth Surgical Tape, 2x2 (in/yd) chucks pad for drainage PRN Compression Wrap Compression Stockings Add-Ons Electronic Signature(s) Signed: 12/12/2021 4:30:42 PM By: [name] Dredge Entered By: [name] Dredge on 12/12/2021 13:17:38 Routon, [name] ([id number]) -------------------------------------------------------------------------------- Vitals Details Patient Name: [name]. Date of Service: 12/12/2021 1:00 PM Medical Record Number: [id number] Patient Account Number: [id number] Date of Birth/Sex: [birth year] (66 y.o. F) Treating RN: [name] Dredge Primary Care Jonetta Dagley: Ranae Plumber Other Clinician: Referring Renise Gillies: Ranae Plumber Treating Isamu Trammel/Extender: Skipper Cliche in Treatment: 19 Vital Signs Time Taken: 13:02 Temperature (F): 98 Height (in): 63 Pulse (bpm): 73 Weight (lbs): 372 Respiratory Rate (breaths/min): 18 Body Mass Index (BMI): 65.9 Blood Pressure (mmHg): 115/59 Reference Range: 80 - 120 mg / dl Electronic Signature(s) Signed: 12/12/2021 4:30:42 PM By: [name] Dredge Entered By: [name] Dredge on 12/12/2021 13:05:18

## 2021-12-13 ENCOUNTER — Telehealth: Payer: Self-pay

## 2021-12-13 NOTE — Telephone Encounter (Signed)
Patient instructed to take an extra potassium tablet on the days hey symptoms require her to take an extra furosemide tablet. Patient verbalized understanding, and states she will take an extra potassium on days that she increases furosemide to 40 mg, and that she will call if she starts needing to increase to '40mg'$  more than occasionally.  Georg Ruddle, RN

## 2021-12-13 NOTE — Telephone Encounter (Addendum)
Message below with lab results called to patient.  Patient states that in the past week she has had to increase furosemide to 40 mg/day, increased from normal daily scheduled dose of 20 mg/day, twice for increased shortness of breath and lower ext edema. Darylene Price, FNP, notified and patient informed that since her kidney function has improved, this is fine. If she finds herself needing to increase furosemide dose multiple times a week, or on a regular basis, to call and notify us. Patient verbalized understanding. Patient had medication interaction questions for pharmacist so the call was transferred to pharmacist at this time Georg Ruddle, RN  ----- Message from Alisa Graff, Sherrill sent at 12/13/2021  8:24 AM EDT ----- Potassium level is normal and kidney function looks better! Continue your medications.

## 2021-12-13 NOTE — Telephone Encounter (Signed)
Patient requested information about cranberry juice. Drinking 10 ounces per day of cranberry juice cocktail for prevention of urinary tract infections. Recommended switch to 1/2 cup of cranberry juice concentrate to reduce glucose and sodium content.   Also has required 1 extra dose and 1/2 extra dose last week due to increased weight. Inquired about diet- eating pizza with shaved beef and salads. Recommended avoiding red meat to reduce sodium intake. Will follow up with NP to assess K given additional doses of diuretics recently.  Wynelle Cleveland, PharmD Pharmacy Resident  12/13/2021 10:24 AM

## 2021-12-19 ENCOUNTER — Encounter: Payer: Medicare HMO | Admitting: Internal Medicine

## 2021-12-19 DIAGNOSIS — L97811 Non-pressure chronic ulcer of other part of right lower leg limited to breakdown of skin: Secondary | ICD-10-CM | POA: Diagnosis not present

## 2021-12-20 NOTE — Progress Notes (Signed)
EMILEY, DIGIACOMO ([id number]) Visit Report for 12/19/2021 Arrival Information Details Patient Name: [name], [name]. Date of Service: 12/19/2021 1:00 PM Medical Record Number: [id number] Patient Account Number: [id number] Date of Birth/Sex: [birth year] (66 y.o. F) Treating RN: [name] Dredge Primary Care Deiontae Rabel: Ranae Plumber Other Clinician: Referring Jaaziel Peatross: Ranae Plumber Treating Maryna Yeagle/Extender: Tito Dine in Treatment: 20 Visit Information History Since Last Visit Added or deleted any medications: No Patient Arrived: [name] Any new allergies or adverse reactions: No Arrival Time: 13:13 Had a fall or experienced change in No Accompanied By: self activities of daily living that may affect Transfer Assistance: None risk of falls: Patient Identification Verified: Yes Hospitalized since last visit: No Secondary Verification Process Completed: Yes Has Dressing in Place as Prescribed: Yes Pain Present Now: No Electronic Signature(s) Signed: 12/19/2021 4:10:46 PM By: [name] Dredge Entered By: [name] Dredge on 12/19/2021 13:13:27 Korus, [name] ([id number]) -------------------------------------------------------------------------------- Clinic Level of Care Assessment Details Patient Name: [name]. Date of Service: 12/19/2021 1:00 PM Medical Record Number: [id number] Patient Account Number: [id number] Date of Birth/Sex: 1955-08-22 (66 y.o. F) Treating RN: [name] Dredge Primary Care Hartlyn Reigel: Ranae Plumber Other Clinician: Referring Benedetta Sundstrom: Ranae Plumber Treating Auren Valdes/Extender: Tito Dine in Treatment: 20 Clinic Level of Care Assessment Items TOOL 4 Quantity Score '[]'$  - Use when only an EandM is performed on FOLLOW-UP visit 0 ASSESSMENTS - Nursing Assessment / Reassessment X - Reassessment of Co-morbidities (includes updates in patient status) 1 10 X- 1 5 Reassessment of Adherence to Treatment Plan ASSESSMENTS - Wound  and Skin Assessment / Reassessment X - Simple Wound Assessment / Reassessment - one wound 1 5 '[]'$  - 0 Complex Wound Assessment / Reassessment - multiple wounds '[]'$  - 0 Dermatologic / Skin Assessment (not related to wound area) ASSESSMENTS - Focused Assessment X - Circumferential Edema Measurements - multi extremities 1 5 '[]'$  - 0 Nutritional Assessment / Counseling / Intervention X- 1 5 Lower Extremity Assessment (monofilament, tuning fork, pulses) '[]'$  - 0 Peripheral Arterial Disease Assessment (using hand held doppler) ASSESSMENTS - Ostomy and/or Continence Assessment and Care '[]'$  - Incontinence Assessment and Management 0 '[]'$  - 0 Ostomy Care Assessment and Management (repouching, etc.) PROCESS - Coordination of Care X - Simple Patient / Family Education for ongoing care 1 15 '[]'$  - 0 Complex (extensive) Patient / Family Education for ongoing care '[]'$  - 0 Staff obtains Programmer, systems, Records, Test Results / Process Orders '[]'$  - 0 Staff telephones HHA, Nursing Homes / Clarify orders / etc '[]'$  - 0 Routine Transfer to another Facility (non-emergent condition) '[]'$  - 0 Routine Hospital Admission (non-emergent condition) '[]'$  - 0 New Admissions / Biomedical engineer / Ordering NPWT, Apligraf, etc. '[]'$  - 0 Emergency Hospital Admission (emergent condition) X- 1 10 Simple Discharge Coordination '[]'$  - 0 Complex (extensive) Discharge Coordination PROCESS - Special Needs '[]'$  - Pediatric / Minor Patient Management 0 '[]'$  - 0 Isolation Patient Management '[]'$  - 0 Hearing / Language / Visual special needs '[]'$  - 0 Assessment of Community assistance (transportation, D/C planning, etc.) '[]'$  - 0 Additional assistance / Altered mentation '[]'$  - 0 Support Surface(s) Assessment (bed, cushion, seat, etc.) INTERVENTIONS - Wound Cleansing / Measurement Mathies, Ashna C. ([id number]) X- 1 5 Simple Wound Cleansing - one wound '[]'$  - 0 Complex Wound Cleansing - multiple wounds X- 1 5 Wound Imaging (photographs -  any number of wounds) '[]'$  - 0 Wound Tracing (instead of photographs) X- 1 5 Simple Wound Measurement - one wound '[]'$  -  0 Complex Wound Measurement - multiple wounds INTERVENTIONS - Wound Dressings X - Small Wound Dressing one or multiple wounds 1 10 '[]'$  - 0 Medium Wound Dressing one or multiple wounds '[]'$  - 0 Large Wound Dressing one or multiple wounds '[]'$  - 0 Application of Medications - topical '[]'$  - 0 Application of Medications - injection INTERVENTIONS - Miscellaneous '[]'$  - External ear exam 0 '[]'$  - 0 Specimen Collection (cultures, biopsies, blood, body fluids, etc.) '[]'$  - 0 Specimen(s) / Culture(s) sent or taken to Lab for analysis '[]'$  - 0 Patient Transfer (multiple staff / Civil Service fast streamer / Similar devices) '[]'$  - 0 Simple Staple / Suture removal (25 or less) '[]'$  - 0 Complex Staple / Suture removal (26 or more) '[]'$  - 0 Hypo / Hyperglycemic Management (close monitor of Blood Glucose) '[]'$  - 0 Ankle / Brachial Index (ABI) - do not check if billed separately X- 1 5 Vital Signs Has the patient been seen at the hospital within the last three years: Yes Total Score: 85 Level Of Care: New/Established - Level 3 Electronic Signature(s) Signed: 12/19/2021 4:10:46 PM By: [name] Dredge Entered By: [name] Dredge on 12/19/2021 13:53:59 Kneisley, [name] ([id number]) -------------------------------------------------------------------------------- Encounter Discharge Information Details Patient Name: [name]. Date of Service: 12/19/2021 1:00 PM Medical Record Number: [id number] Patient Account Number: [id number] Date of Birth/Sex: [birth year] (66 y.o. F) Treating RN: [name] Dredge Primary Care Jakolby Sedivy: Ranae Plumber Other Clinician: Referring Daysy Santini: Ranae Plumber Treating Vaden Becherer/Extender: Tito Dine in Treatment: 20 Encounter Discharge Information Items Discharge Condition: Stable Ambulatory Status: Cane Discharge Destination: Home Transportation:  Other Accompanied By: self Schedule Follow-up Appointment: Yes Clinical Summary of Care: Electronic Signature(s) Signed: 12/19/2021 1:55:26 PM By: [name] Dredge Entered By: [name] Dredge on 12/19/2021 13:55:26 Sliwa, [name] ([id number]) -------------------------------------------------------------------------------- Lower Extremity Assessment Details Patient Name: [name] Date of Service: 12/19/2021 1:00 PM Medical Record Number: [id number] Patient Account Number: [id number] Date of Birth/Sex: 05-10-1956 (66 y.o. F) Treating RN: [name] Dredge Primary Care Lakaya Tolen: Ranae Plumber Other Clinician: Referring Tukker Byrns: Ranae Plumber Treating Goro Wenrick/Extender: Tito Dine in Treatment: 20 Edema Assessment Assessed: [Left: No] [Right: No] Edema: [Left: Yes] [Right: Yes] Calf Left: Right: Point of Measurement: 39 cm From Medial Instep 86 cm 89 cm Ankle Left: Right: Point of Measurement: 9 cm From Medial Instep 54 cm 55.5 cm Vascular Assessment Pulses: Dorsalis Pedis Palpable: [Left:Yes] [Right:Yes] Electronic Signature(s) Signed: 12/19/2021 4:10:46 PM By: [name] Dredge Entered By: [name] Dredge on 12/19/2021 13:27:05 Blagg, [name] ([id number]) -------------------------------------------------------------------------------- Multi Wound Chart Details Patient Name: [name]. Date of Service: 12/19/2021 1:00 PM Medical Record Number: [id number] Patient Account Number: [id number] Date of Birth/Sex: Sep 15, 1955 (66 y.o. F) Treating RN: [name] Dredge Primary Care Yetta Marceaux: Ranae Plumber Other Clinician: Referring Jamil Armwood: Ranae Plumber Treating Asyria Kolander/Extender: Tito Dine in Treatment: 20 Vital Signs Height(in): 71 Pulse(bpm): 47 Weight(lbs): 902 Blood Pressure(mmHg): 147/74 Body Mass Index(BMI): 65.9 Temperature(F): 98.1 Respiratory Rate(breaths/min): 20 Photos: [N/A:N/A] Wound Location: Left, Medial,  Anterior Lower Leg N/A N/A Wounding Event: Gradually Appeared N/A N/A Primary Etiology: Lymphedema N/A N/A Comorbid History: Cataracts, Chronic sinus N/A N/A problems/congestion, Anemia, Lymphedema, Asthma, Sleep Apnea, Tuberculosis, Angina, Hypertension, Gout, Osteoarthritis Date Acquired: 10/17/2021 N/A N/A Weeks of Treatment: 9 N/A N/A Wound Status: Open N/A N/A Wound Recurrence: No N/A N/A Measurements L x W x D (cm) 4.5x5.5x0.1 N/A N/A Area (cm) : 19.439 N/A N/A Volume (cm) : 1.944 N/A N/A % Reduction in Area: -147.50% N/A  N/A % Reduction in Volume: -147.60% N/A N/A Classification: Full Thickness Without Exposed N/A N/A Support Structures Exudate Amount: Medium N/A N/A Exudate Type: Serosanguineous N/A N/A Exudate Color: red, brown N/A N/A Granulation Amount: Medium (34-66%) N/A N/A Granulation Quality: Red N/A N/A Necrotic Amount: Small (1-33%) N/A N/A Exposed Structures: Fat Layer (Subcutaneous Tissue): N/A N/A Yes Epithelialization: Medium (34-66%) N/A N/A Treatment Notes Electronic Signature(s) Signed: 12/19/2021 4:10:46 PM By: [name] Dredge Entered By: [name] Dredge on 12/19/2021 13:28:04 [name] ([id number]) -------------------------------------------------------------------------------- [place] Details Patient Name: [name]. Date of Service: 12/19/2021 1:00 PM Medical Record Number: [id number] Patient Account Number: [id number] Date of Birth/Sex: [birth year] (66 y.o. F) Treating RN: [name] Dredge Primary Care Elmus Mathes: Ranae Plumber Other Clinician: Referring Drisana Schweickert: Ranae Plumber Treating Kamori Kitchens/Extender: Tito Dine in Treatment: 20 Active Inactive Wound/Skin Impairment Nursing Diagnoses: Impaired tissue integrity Knowledge deficit related to ulceration/compromised skin integrity Goals: Ulcer/skin breakdown will have a volume reduction of 30% by week 4 Date Initiated: 08/01/2021 Target  Resolution Date: 08/29/2021 Goal Status: Active Ulcer/skin breakdown will have a volume reduction of 50% by week 8 Date Initiated: 08/01/2021 Target Resolution Date: 09/26/2021 Goal Status: Active Ulcer/skin breakdown will have a volume reduction of 80% by week 12 Date Initiated: 08/01/2021 Target Resolution Date: 10/24/2021 Goal Status: Active Ulcer/skin breakdown will heal within 14 weeks Date Initiated: 08/01/2021 Target Resolution Date: 11/07/2021 Goal Status: Active Interventions: Assess patient/caregiver ability to obtain necessary supplies Assess patient/caregiver ability to perform ulcer/skin care regimen upon admission and as needed Assess ulceration(s) every visit Provide education on ulcer and skin care Notes: Electronic Signature(s) Signed: 12/19/2021 4:10:46 PM By: [name] Dredge Entered By: [name] Dredge on 12/19/2021 13:27:56 Seidl, [name] ([id number]) -------------------------------------------------------------------------------- Non-Wound Condition Assessment Details Patient Name: [name]. Date of Service: 12/19/2021 1:00 PM Medical Record Number: [id number] Patient Account Number: [id number] Date of Birth/Sex: September 05, 1955 (66 y.o. F) Treating RN: [name] Dredge Primary Care Pernie Grosso: Ranae Plumber Other Clinician: Referring Hans Rusher: Ranae Plumber Treating Maiyah Goyne/Extender: Tito Dine in Treatment: 20 Non-Wound Condition: Condition: Lymphedema Location: Leg Side: Right Notes: Right leg with decreased drainage this visit. Photos Notes right leg continues to remain with no open areas Electronic Signature(s) Signed: 12/19/2021 4:10:46 PM By: [name] Dredge Entered By: [name] Dredge on 12/19/2021 13:27:45 Shumard, [name] ([id number]) -------------------------------------------------------------------------------- Pain Assessment Details Patient Name: [name] Date of Service: 12/19/2021 1:00 PM Medical Record Number:  [id number] Patient Account Number: [id number] Date of Birth/Sex: January 18, 1956 (66 y.o. F) Treating RN: [name] Dredge Primary Care Natonya Finstad: Ranae Plumber Other Clinician: Referring Nakeisha Greenhouse: Ranae Plumber Treating Doraine Schexnider/Extender: Tito Dine in Treatment: 20 Active Problems Location of Pain Severity and Description of Pain Patient Has Paino No Site Locations Rate the pain. Current Pain Level: 0 Pain Management and Medication Current Pain Management: Electronic Signature(s) Signed: 12/19/2021 4:10:46 PM By: [name] Dredge Entered By: [name] Dredge on 12/19/2021 13:17:03 Mounger, [name] ([id number]) -------------------------------------------------------------------------------- Patient/Caregiver Education Details Patient Name: [name]. Date of Service: 12/19/2021 1:00 PM Medical Record Number: [id number] Patient Account Number: [id number] Date of Birth/Gender: [birth year] (66 y.o. F) Treating RN: [name] Dredge Primary Care Physician: Ranae Plumber Other Clinician: Referring Physician: Ranae Plumber Treating Physician/Extender: Tito Dine in Treatment: 20 Education Assessment Education Provided To: Patient Education Topics Provided Wound/Skin Impairment: Handouts: Caring for Your Ulcer Methods: Explain/Verbal Responses: State content correctly Electronic Signature(s) Signed: 12/19/2021 4:10:46 PM By: [name] Dredge Entered By: [name] Dredge on  12/19/2021 13:54:45 KIERRAH, KILBRIDE ([id number]) -------------------------------------------------------------------------------- Wound Assessment Details Patient Name: DEROTHA, FISHBAUGH. Date of Service: 12/19/2021 1:00 PM Medical Record Number: [id number] Patient Account Number: [id number] Date of Birth/Sex: [birth year] (66 y.o. F) Treating RN: [name] Dredge Primary Care Beauden Tremont: Ranae Plumber Other Clinician: Referring Eric Morganti: Ranae Plumber Treating Drayven Marchena/Extender: Tito Dine in Treatment: 20 Wound Status Wound Number: 10 Primary Lymphedema Etiology: Wound Location: Left, Medial, Anterior Lower Leg Wound Open Wounding Event: Gradually Appeared Status: Date Acquired: 10/17/2021 Comorbid Cataracts, Chronic sinus problems/congestion, Anemia, Weeks Of Treatment: 9 History: Lymphedema, Asthma, Sleep Apnea, Tuberculosis, Angina, Clustered Wound: No Hypertension, Gout, Osteoarthritis Photos Wound Measurements Length: (cm) 4.5 Width: (cm) 5.5 Depth: (cm) 0.1 Area: (cm) 19.439 Volume: (cm) 1.944 % Reduction in Area: -147.5% % Reduction in Volume: -147.6% Epithelialization: Medium (34-66%) Tunneling: No Undermining: No Wound Description Classification: Full Thickness Without Exposed Support Structures Exudate Amount: Medium Exudate Type: Serosanguineous Exudate Color: red, brown Foul Odor After Cleansing: No Slough/Fibrino Yes Wound Bed Granulation Amount: Medium (34-66%) Exposed Structure Granulation Quality: Red Fat Layer (Subcutaneous Tissue) Exposed: Yes Necrotic Amount: Small (1-33%) Necrotic Quality: Adherent Slough Treatment Notes Wound #10 (Lower Leg) Wound Laterality: Left, Medial, Anterior Cleanser Normal Saline Discharge Instruction: Wash your hands with soap and water. Remove old dressing, discard into plastic bag and place into trash. Cleanse the wound with Normal Saline prior to applying a clean dressing using gauze sponges, not tissues or cotton balls. Do not scrub or use excessive force. Pat dry using gauze sponges, not tissue or cotton balls. Soap and Water Discharge Instruction: Gently cleanse wound with antibacterial soap, rinse and pat dry prior to dressing wounds HEIRESS, WILLIAMSON. ([id number]) Peri-Wound Care Topical Primary Dressing Silvercel 4 1/4x 4 1/4 (in/in) Discharge Instruction: Apply Silvercel 4 1/4x 4 1/4 (in/in) as instructed Secondary Dressing ABD Pad 5x9 (in/in) Discharge Instruction: Cover  with ABD pad Secured With ACE WRAP - 11M ACE Elastic Bandage With VELCRO Brand Closure, 4 (in) Medipore Tape - 11M Medipore H Soft Cloth Surgical Tape, 2x2 (in/yd) Kerlix Roll Sterile or Non-Sterile 6-ply 4.5x4 (yd/yd) Discharge Instruction: Apply Kerlix as directed chucks pad for drainage PRN Compression Wrap Compression Stockings Add-Ons Electronic Signature(s) Signed: 12/19/2021 4:10:46 PM By: [name] Dredge Entered By: [name] Dredge on 12/19/2021 13:26:25 [name] ([id number]) -------------------------------------------------------------------------------- Vitals Details Patient Name: [name]. Date of Service: 12/19/2021 1:00 PM Medical Record Number: [id number] Patient Account Number: [id number] Date of Birth/Sex: Jan 17, 1956 (66 y.o. F) Treating RN: [name] Dredge Primary Care Zailee Vallely: Ranae Plumber Other Clinician: Referring Sharna Gabrys: Ranae Plumber Treating Amaliya Whitelaw/Extender: Tito Dine in Treatment: 20 Vital Signs Time Taken: 13:13 Temperature (F): 98.1 Height (in): 63 Pulse (bpm): 70 Weight (lbs): 372 Respiratory Rate (breaths/min): 20 Body Mass Index (BMI): 65.9 Blood Pressure (mmHg): 147/74 Reference Range: 80 - 120 mg / dl Electronic Signature(s) Signed: 12/19/2021 4:10:46 PM By: [name] Dredge Entered By: [name] Dredge on 12/19/2021 13:16:54

## 2021-12-20 NOTE — Progress Notes (Signed)
MELITA, VILLALONA ([id number]) Visit Report for 12/19/2021 HPI Details Patient Name: KARMIN, KASPRZAK. Date of Service: 12/19/2021 1:00 PM Medical Record Number: [id number] Patient Account Number: [id number] Date of Birth/Sex: 1955-12-06 (66 y.o. F) Treating RN: [name] Dredge Primary Care Provider: Ranae Plumber Other Clinician: Referring Provider: Ranae Plumber Treating Provider/Extender: Tito Dine in Treatment: 20 History of Present Illness HPI Description: The patient is a 66 year old female with history of hypertension and a long-standing history of bilateral lower extremity lymphedema (first presented on 4/2) . She has had open ulcers in the past which have always responded to compression therapy. She had briefly been to a lymphedema clinic in the past which helped her at the time. this time around she stopped treatment of her lymphedema pumps approximately 2 weeks ago because of some pain in the knees and then noticed the right leg getting worse. She was seen by her PCP who put her on clindamycin 4 times a day 2 days ago. The patient has seen [facility] and Dr. Delana Meyer had seen her last year where a vascular study including venous and arterial duplex studies were within normal limits. he had recommended compression stockings and lymphedema pumps and the patient has been using this in about 2 weeks ago. She is known to be diabetic but in the past few time she's gone to her primary care doctor her hemoglobin A1c has been normal. 02/11/2015 - after her last visit she took my advice and went to the ER regarding the progressive cellulitis of her right lower extremity and she was admitted between July 17 and 22nd. She received IV antibiotics and then was sent home on a course of steroid-induced and oral antibiotics. She has improved much since then. 02/17/2015 -- she has been doing fine and the weeping of her legs has remarkably gone down. She has no fresh  issues. READMISSION 01/15/18 This patient was given this clinic before most recently in 2016 seen by Dr. Con Memos. She has massive bilateral lymphedema and over the last 2 months this had weeping edema out of the left leg. She has compression pumps but her compliance with these has been minimal. She has [facility] they've been using TCA/ABDs/kerlix under an Ace wrap.she has had recent problems with cellulitis. She was apparently seen in the ER and 12/23/17 and given clindamycin. She was then followed by her primary doctor and given doxycycline and Keflex. The pain seems to have settled down. In April 2018 the patient had arterial studies done at [facility]. This showed triphasic waveforms throughout the right leg and mostly triphasic waveforms on the left except for monophasic at the posterior tibial artery distally. She was not felt to have evidence of right lower extremity arterial stenosis or significant problems on the left side. 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 She also had a right lower extremity venous Doppler in January 2018 this was limited by the patient's body habitus and lymphedema. Most of the proximal veins were not visualized The patient presents with an area of denuded skin on the anterior medial part of the left calf. There is weeping edema fluid here. 01/22/18; the patient has somewhat better edema control using her compression pumps twice a day and as a result she has much better epithelialization on the left anterior calf area. Only a small open area remains. 01/29/18; the patient has been compliant with her compression pumps. Both the areas on her calf that healed. The remaining  area on the left anterior leg is fully epithelialized Readmission: 02/20/2019 upon evaluation today patient presents for reevaluation due to issues that she is having with the bilateral lower extremities. She actually has wounds open on both legs. On  the right she has an area in the crease of her leg on the right around the knee region which is actually draining quite a bit and actually has some fungal type appearance to it. She has been on nystatin powder that seems to have helped to some degree. In regard to the left lower extremity this is actually in the lower portion of her leg closer to the ankle and again is continuing to drain as well unfortunately. There does not appear to be any signs of active infection at this time which is good news. No fevers, chills, nausea, vomiting, or diarrhea. She tells me that since she was seen last year she is actually been doing quite well for the most part with regard to her lower extremities. Unfortunately she now is experiencing a little bit more drainage at this time. She is concerned about getting this under control so that it does not get significantly worse. 02/27/2019 on evaluation today patient appears to be doing somewhat better in regard to her bilateral lower extremity wounds. She has been tolerating the dressing changes without complication. Fortunately there is no signs of active infection at this point. No fevers, chills, nausea, vomiting, or diarrhea. She did get her dressing supplies which is excellent news she was extremely excited to get these. She also got paperwork from [employer] for their financial assistance program where they may be able to help her out in the future if needed with supplies at discounted prices. 03/06/2019 on evaluation today patient appears to be doing a little worse with regard to both areas of weeping on her bilateral lower extremities. This is around the right medial knee and just above the left ankle. With that being said she is unfortunately not doing as well as I would like to see. I feel like she may need to potentially go see someone at the lymphedema clinic as the wraps that she needs or even beyond what we can do here at the wound care center. She really does not  have wounds she just has open areas of weeping that are causing some difficulty for her. Subsequently because of this and the moisture I am concerned about the potential for infection I am going to likely give her a prophylactic antibiotic today, Keflex, just to be on the safe side. Nonetheless again there is no obvious signs of active infection at this time. JOWANNA, LOEFFLER ([id number]) 03/13/2019 on evaluation today patient appears to be doing well with regard to her bilateral lower extremities where she has been weeping compared to even last week's evaluation. I see some areas of new skin growth which is excellent and overall I am very pleased with how things seem to be progressing. No fevers, chills, nausea, vomiting, or diarrhea. 03/20/2019 on evaluation today patient unfortunately is continuing to have issues with significant edema of the left lower extremity. Her right side seems to be doing much better. Unfortunately her left side is showing increased weeping of the lower portion of her leg. This is quite unfortunate obviously we were hoping to get her into the lymphedema clinic they really do not seem to when I see her how if she is draining. Despite the fact this is really not wound related but more lymphedema weeping related. Nonetheless  I do not know that this can be helpful for her to even go for that appointment since again I am not sure there is much that they would actually do at this point. We may need to try a 4 layer compression wrap as best we can on her leg. She is on the Augmentin currently although I am still concerned about whether or not there could be potentially something going on infection wise I would obtain a culture though I understand is not the best being that is a surface culture I just 1 to make sure I do not seem to be missing anything. 03/27/2019 on evaluation today patient appears to be doing much better in regard to the left lower extremity compared to last week.  Last week she had tremendous weeping which I think was subsequent to infection now she seems to be doing much better and very pleased. This is not completely healed but there is a lot of new skin growth and it has dried out quite a bit. Overall I think that we are doing well with how things are moving along at this time. No fevers, chills, nausea, vomiting, or diarrhea. 04/03/2019 evaluation today patient appears to be doing a little worse this week compared to last time I saw her. I think this may be due to the fact that she is having issues with not being able to sleep in her bed at least not until last night. She is therefore been in a lift chair and subsequently has also had issues with not been able to use her pumps since she could not get in bed. With that being said the patient overall seems to be doing okay I do think I may want extend the antibiotic for a little bit longer at least until we can see if her edema and her weeping gets better and if it is then obviously I can always discontinue the antibiotics as of next week however I want her to continue to have it over the next week. 04/10/2019 on evaluation today patient unfortunately is still doing poorly with regard to her left lower extremity. Her right is all things considering doing fairly well. On the left however she continues to have spreading of the area of infection and weeping which appears to be even a larger surface area than noted last week. She did have a positive culture for Pseudomonas in particular which seems to have been of concern she still has green/yellow discharge consistent with Pseudomonas and subsequently a tremendous amount of it. This has me obviously still concerned about the infection not really clearing up despite the fact that on culture it appears the Cipro should have been a good option for treating this. I think she may at this point need IV antibiotics since things are not doing better I do not want to get  worse and cause sepsis. She is in agreement with the plan and believes as well that she likely does need to go to the hospital for IV vancomycin. Or something of the like depending on what the recommendation is from the ER. 04/17/2019 on evaluation today patient appears to be doing excellent in regard to her lower extremity on the left. She was in the hospital for several days from when I sent her last we saw her until just this past Tuesday. Fortunately her drainage is significantly improved and in fact is mostly clear. There is just a couple small areas that may still drain a little bit she states  that the [facility] they prescribed for her at discharge she went picked up from pharmacy and got home but has not been able to find it since. She is looked everywhere. She is wondering if I will replace that for her today I will be more than happy to do that. 05/01/2019 on evaluation today patient actually appears to be doing quite well with regard to her lower extremities. She occasionally is having areas that will leak and then heal up mainly when a piece of the fibrotic skin pops off but fortunately she is not having any signs of active infection at this time. Overall she also really does not have any obvious weeping at this time. I do believe however she really needs some compression wraps and I think this may be a good time to get her back to the lymphedema clinic. 05/11/2019 on evaluation today patient actually appears to be doing quite well with regard to her bilateral lower extremities. She occasionally will have a small area that we per another but in general seems to be completely healed which is great news. Overall very pleased with how everything seems to be progressing. She does have her appointment with lymphedema clinic on November 18. 05/25/2019 on evaluation today patient appears to be doing well with regard to her left lower extremity. I am very pleased in this regard. In regard to her right  leg this actually did start draining more I think it is mainly due to the fact that her leg is more swollen. I am not seeing any obvious signs of infection at this time although that is definitely something were obviously acutely aware of simply due to the fact that she had an issue not too far back with exactly this issue. Nonetheless I do feel like that lymphedema clinic would still be beneficial for her. I explained obviously if they are not able to do anything treatment wise on the right leg we could at least have them treat her left leg and then proceed from there. The patient is really in agreement with that plan. If they are able to do both as the drainage slows down that I would be happy to let them handle both. 06/01/2019 on evaluation today patient unfortunately appears to be doing worse with regard to her right lower extremity. The left lower extremity is still maintaining at this point. Unfortunately she has been having significantly increased pain over the past several days and has been experiencing as well increased swelling of the right lower extremity. I really do not know that I am seeing anything that appears to be obvious for infection at this point to be peripherally honest. With that being said the patient does seem to be having much more swelling that she is even experienced in the past and coupled with increased pain in her hip as well I am concerned that again she could potentially have a DVT although I am not 100% sure of this. I think it something that may need to be checked out. We discussed the possibility of sending her for a DVT study through the hospital but unfortunately transportation is an issue if she does have a DVT I do not want her to wait days to be able to get in for that test however if she has this scheduled as an outpatient that is as fast that she will be able to get the test scheduled for transportation purposes. That will also fall on Thanksgiving so  subsequently she did actually be looking  at either Friday or even next week before we would know anything back from this. That is much too long in my opinion. Subsequent to the amount of discomfort she is experiencing the patient is actually okay with going to the ER for evaluation today. 06/12/2019 on evaluation today patient actually appears to be doing significantly better compared to last time I saw her. Following when I last saw her she was actually in the hospital from that Monday until the following Sunday almost 1 full week. She actually was placed on Keflex in the hospital following the time for her to be discharged and Dr. Steva Ready has recommended 2 times a day dosing of the Keflex for the next year in order to help with more prophylactic/preventative measures with regard to her developing cellulitis. Overall I think this sounds like an excellent plan. The patient unfortunately is good to have trouble being treated at lymphedema clinic due to the fact that she really cannot get up on the bed that they have there. They also state that they cannot manage her as long as she has anything draining at this point. Obviously that is somewhat unfortunate as she does need help with edema control but nonetheless we will have to do what we can for her outside of it sounds like the lymphedema clinic scenario at this point. 06/19/2019 on evaluation today patient appears to be doing fairly well with regard to her bilateral lower extremities. She is not nearly as swollen and shows no signs of infection at this point. There is no evidence of cellulitis whatsoever. She also has no open wounds or draining at this point LAKYIA, BEHE. ([id number]) which is also good news. No fever chills noted. She seems to be in very good spirits and in fact appears to be doing quite well. READMISSION 11/27/2019 This is a 66 year old woman that we have had in this clinic several times before including 2015, 16 and 19 and  then most recently from 03/20/2019 through 06/19/2019 with bilateral lower extremity lymphedema. She has had previous arterial and reflux studies done years ago which were not all that remarkable. 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 She does have a positive family history and she had large legs starting may be in her 10s. She was recently in hospital from 10/20/2019 through 10/28/2019 with right leg cellulitis. She was given Ancef and clindamycin and then Zosyn when a culture showed Pseudomonas. At that time there was purulent drainage. She was followed by infectious disease Dr Steva Ready. The patient is now back at home. She has noted increased swelling in the right and no drainage in her right leg mostly on the posterior medial aspect in the calf area. She has not had pain or fever. She has literally been improved lysing above dressings because her at the area of this is far too large for standard compression. She has been wrapping the areas with sheets to resorptive pads. She is found these helped somewhat. She does have an appointment with the lymphedema clinic in [place] in late June. Past medical history includes bilateral lymphedema, hypertension, obstructive sleep apnea with CPAP. Recent hospitalization with apparently Pseudomonas cellulitis of the right lower leg 12/15/2019 upon evaluation today patient appears to be doing a little bit worse in regard to her right lower extremity. Unfortunately she is having more weeping down in the lower portion of her leg. Fortunately there is no signs of active infection at this time. No  fever chills noted. The patient states she is not having increased pain except for when she attempted to use the lymphedema pumps unfortunately she states that she did have pain when she did this. Otherwise we been using absorptive dressings of one type or another she is using diapers at home and then  subsequently Ace wraps. In regard to the barrier cream we have discussed the possibility of derma cloud which she would like to try I do not have a problem with that. 12/22/2019 upon evaluation today patient actually appears to be doing better in regard to her leg ulcers at this point. Fortunately there does not appear to be any signs of active infection which is great news and I am extremely pleased with where things are progressing at this time. There is no sign of active infection currently. The patient is very pleased to see things doing so well. 12/29/2019 upon evaluation today patient appears to be doing a little bit better in regard to her weeping in general over her lower extremities. She does have some signs of mild erythema little bit more than what I noted last week or rather last visit. Nonetheless I think that my threshold for switching her antibiotics from Keflex to something else is very low at this point considering that she has had such severe infections in the past that seem to come almost out of nowhere. There is a little erythema and warmth noted of the lower portion of her leg compared to the upper which also makes me want to go ahead and address things more rapidly at this point. Likely I would switch out the Keflex for something like Levaquin ideally. 7/16; patient with severe bilateral lymphedema. She has superficial wounds albeit almost circumferential now on the left lateral lower leg. This may be new from last time. Small area on the right anterior lower leg and then another area on the right medial lower leg and of pannus fold. She has been using various absorptive garments. She states she is using her compression pumps once a day occasionally twice. Culture from her last visit here was negative 01/29/2020 on evaluation today patient appears to be doing excellent at this point in regard to her legs with regard to infection I see no signs of active infection at this point.  She still does have unfortunately areas of weeping this is minimal on the right now her left is actually significantly worse although I do not think it is as bad as last week with Dr. Dellia Nims saw her. She has been trying to pump and elevate her legs is much as possible. She has previously been on the Keflex and in the past for prevention that seems to do fairly well and likely can extend that today. 02/04/2020 on evaluation today patient appears to be doing better in regard to her legs bilaterally. Fortunately there is no signs of active infection at this time which is great news and overall she has less weeping on the left compared to the right and there is several spots where she is pretty much sealed up with no draining regions. Overall very pleased in this regard. 02/19/2020 on evaluation today patient appears to be doing very well in regard to her wounds currently. Fortunately there is no evidence of active infection overall very pleased with where things stand. She is significantly improved in regard to her edema I am extremely pleased in this regard she tells me that the popping no longer hurts and in fact she actually  looks forward to it. 03/04/2020 on evaluation today patient appears to be doing excellent in regard to her lower extremities. Fortunately there is no signs of active infection at this time. No fevers, chills, nausea, vomiting, or diarrhea. 03/25/2020 on evaluation today patient appears to be doing a little bit more poorly in regard to her legs at this point. She tells me that she is still continue to have issues with drainage and this has been a little bit worse she was getting ready to start taking the Keflex again but wanted to see me first. Fortunately there is no signs of active infection at this time. No fevers, chills, nausea, vomiting, or diarrhea. 04/15/2020 upon evaluation today patient appears to be doing somewhat poorly in regard to her right leg. She tells me she has been  having more pain she has been taking the Keflex that was previously prescribed unfortunately that just does not seem to help with this. She was hoping that the pain on her right was actually coming from the fact that she was having issues with her wrap having gotten caught in her recliner. With that being said she tells me that she knew something was not right. Currently her right leg is warm to touch along with being erythematous all the way up to around at least mid thigh as far as I can see. The left leg does not appear to be doing that badly though there is increased weeping around the ankle region. 05/06/2020 on evaluation today patient appears to be doing much better than last time I saw her. She did go to the hospital where she was admitted for 2 days and treated with antibiotic therapy. She was discharged with antibiotics as well and has done extremely well. I am extremely pleased with where things stand today. There is no signs of active infection at this time which is great news. 05/27/2020 upon evaluation today patient appears to be doing well with regard to her lower extremities bilaterally. She has just a very tiny area on the right leg which is opening on the left leg she is significantly improved though she still has several areas that do appear to be open this is minimal compared to what is been in the past. In general I am extremely pleased with where things stand today. The patient does tell me she is not been using her pumps quite as much as she should be. I do believe that is 1 area she can definitely work on. She has had a lot going on including a Covid exposure and apparently also a outbreak of likely shingles. 06/24/2020 upon evaluation today patient appears to be doing well with regard to her legs in general although the left leg unfortunately is Garritano, Nakyra C. ([id number]) showing some signs of erythema she does have a little bit of increased weeping and to be honest I am  concerned about infection here. I discussed that with her today and I think that we may need to address this sooner rather than later she has been taking Keflex she is not really certain that is been making a big improvement however. No fevers, chills, nausea, vomiting, or diarrhea. 06/30/2020 upon evaluation today patient's legs actually seem to be doing better in my opinion as compared to where they were last week. Fortunately there does not appear to be any signs of active infection. Her culture showed multiple organisms nothing predominate. With that being said the Levaquin seems to have done well I think she has  improved since I last saw her as well. 07/14/2020 upon evaluation today patient appears to be doing actually better in regard to her lower extremities in my opinion. She has been tolerating the dressing changes without complication. Fortunately there is no signs of active infection at this time. 08/04/2020 upon evaluation today patient appears to be doing excellent in regard to her leg ulcers. Fortunately she has very little that is open at this point. This is great news. In fact I think that the Goldbond medicated powder has been excellent for her. It seems to have done the trick where we had tried several other things without as much success. Fortunately there is no evidence of active infection at this time. No fevers, chills, nausea, vomiting, or diarrhea. 09/01/2020 upon evaluation today patient appears to be doing a little bit more poorly than the last time I saw her. She tells me right now that she has been having a lot of drainage compared to where things were previous which has unfortunately led to more irritation as well. She is concerned that this is leading to infection. Again based on what I am seeing today as well I am also concerned of the same to be honest. 09/15/2020 upon evaluation today patient appears to be doing well with regard to her legs compared to what I saw previous.  Fortunately there does not appear to be any evidence of active infection at this time which is great news. At least not as badly as what it was previous. With that being said I do believe that the patient does need to have an extension of the antibiotics. This I believe will actually help her more in the way of making sure this stays under control and does not worsen. That was the Bactrim DS. Readmission: 08/01/2021 upon evaluation today patient appears to be doing actually pretty well all things considered. Is actually been almost a year since have seen her last March. Fortunately I do not see any evidence of active infection locally nor systemically at this point. She does have a couple open areas on the left leg the right leg appears to be doing quite well. In general she has been in the hospital 3 times since I saw her a year ago all 4 cellulitis type issues except for the last 1 which was actually more related to congestive heart failure for that reason she is not using her lymphedema pumps at this point and that is probably the best idea. 08/08/2021 upon evaluation today patient appears to be doing well with regard to her legs all things considered her left leg may be a little bit more swollen based on what I see today. Fortunately I do not see any signs of active infection locally nor systemically at this time which is great news. No fevers, chills, nausea, vomiting, or diarrhea. 08/15/2021 upon evaluation today patient actually appears to be doing excellent in regard to her wounds. She has been tolerating the dressing changes without complication. Fortunately I see no evidence of infection currently which is great news. No fevers, chills, nausea, vomiting, or diarrhea. 08/29/2021 upon evaluation patient appears to be doing excellent she in fact is almost completely healed. I am actually very pleased with where we stand today and I think that she is making wonderful progress she just has a small  area of weeping on the right lateral leg everything else is pretty much completely healed which is also. 09/05/2021 upon evaluation today patient appears to be doing well with  regard to her legs. She does have a small area of weeping on the right leg and there is a small area on the left leg as well. It is potentially something that may need to be addressed in the future more specifically but right now I think that there may have just been a little drainage here on the left. With all that being said I think that this still does not appear to be infected and overall I feel like she is doing quite well. That something we always have to keep a very close eye on as she can change very rapidly from okay to not okay. 09/12/2021 upon evaluation today patient appears to be doing a little bit worse in regard to her leg and actually somewhat concerned about the possibility of infection here. I discussed that with the patient. For that reason I am going to go ahead and see about getting her set up for a repeat prescription for the Bactrim. She is in agreement with that plan. 3/14; patient presents for follow-up. She has been using silver alginate with dressing changes. She is still taking Bactrim prescribed at last clinic visit. She has no issues or complaints today. She has lymphedema pumps however does not use these. 09/26/2020 upon evaluation today patient appears to actually be doing well in regard to her right leg I am pleased in that regard. Unfortunately she has new areas on her left leg that are open at this point that I do need to be addressed. I am also concerned about the warmth around one of the wounds on the more anterior side. I think this is something that we may need to culture today potentially requiring antibiotics going forward. 10/03/2021 upon evaluation today patient unfortunately is not doing nearly as well as she was even last week with her wounds. She is having some issues here with increased  cellulitis of the left lower extremity unfortunately. I did prescribe Augmentin for her eczema prescribed last week unfortunately she never actually received this prescription. The pharmacy stated they never got it. We double checked with them today they still did not receive it. I am not sure what happened in that regard. With that being said the patient is in good spirits today she does not appear to be as sick as where she normally is when the cellulitis gets significantly worse as it has in the past. Nonetheless the leg is much more red not just around the ankle where I was seeing at last week on Tuesday but rather now this is going all the way up to almost her knee and is definitely hot to touch compared to the right leg. Nonetheless I feel like she does need to go to the ER for likely IV antibiotics. 10-10-2021 upon evaluation today patient appears to be doing well with regard to her wound. She has been tolerating the dressing changes that appears to be doing much better. After I saw her last week she did go to the ER they admitted her to the hospital and gave her IV antibiotics she fortunately is doing significantly better. Overall I am extremely pleased with where things stand today. 10-17-2021 upon evaluation today patient appears to be doing well with regard to her wound. In fact this is looking better and her leg is looking better but she still does have some areas here of erythema. We will continue to address this as soon as possible in my opinion. I do believe she may require some debridement and  what appears to be a more defined wound on her left leg at this point. 10-24-2021 upon evaluation patient is definitely showing signs of improvement which is great news. I do not see any evidence of active infection locally or systemically which is great news as well. No fevers, chills, nausea, vomiting, or diarrhea. JANECIA, PALAU ([id number]) 10-31-2021. Upon evaluation today patient's leg  actually feels better from an infection and wound care perspective. I am actually very pleased in this regard. Unfortunately the biggest issue that I see at this time is that the patient is having a significant issue with her breathing. I did put her on the pulse ox machine to check her oxygen saturation and it was pretty much at 100% which is good but she tells me that when she is walking this is much more difficult for her. She also tells me that when she is trying to bend over to perform her dressing changes she is so short of breath due to the swelling and fluid in her abdominal area that she is just not been able to do this. She is tearful and very concerned today to be honest. I am truly sorry to see her this way I know she is really having a hard time at the moment. 11-14-2021 upon evaluation patient actually appears to be doing significantly better compared to when I last saw her. She was actually admitted to the hospital I believe this for 6 days total after I sent her on 10-31-2021. Subsequently they actually pulled off greater than 50 pounds of fluid she tells me she feels significantly better her legs are not as tight she actually has some play in the tissue and it is obvious that she is doing much better just from a mobility standpoint as well as a breathing standpoint. Overall I am extremely happy for her and how she is doing the leg also looks much better on the left. 11-21-2021 upon evaluation today patient appears to be doing well although it does appear that she is going require some sharp debridement the wound is very dry this is the opposite of what its been she was having so much weeping that it was staying extremely wet. Nonetheless we do need to see about going ahead and debriding the wound today. 11-28-2021 upon evaluation today patient appears to be doing well with regard to her wound I definitely see signs of things continuing to improve which is great news. I do not see any  evidence of active infection locally or systemically also great news. 12-05-2021 upon evaluation today patient appears to be doing well with regard to her wound. She has been tolerating the dressing changes. Fortunately there does not appear to be any signs of active infection locally or systemically at this time. No fevers, chills, nausea, vomiting, or diarrhea. 12-12-2021 upon evaluation patient appears to be doing well with regard to her wound this is actually measuring smaller and looking much better. Fortunately I do not see any signs of active infection locally or systemically at this time which is excellent news. 6/13; small wound area in the folds of her lymphedema just above the left ankle. Fortunately the area looks quite good. She has been using silver alginate ABDs and an Ace wrap which she is able to change her self. She is not using her compression pumps out of fear that this could contribute to heart failure. Electronic Signature(s) Signed: 12/20/2021 11:05:19 AM By: Linton Ham MD Entered By: Linton Ham on 12/19/2021 13:42:46  MARILU, RYLANDER ([id number]) -------------------------------------------------------------------------------- Physical Exam Details Patient Name: Turgeon, Drucella C. Date of Service: 12/19/2021 1:00 PM Medical Record Number: [id number] Patient Account Number: [id number] Date of Birth/Sex: [birth year] (66 y.o. F) Treating RN: [name] Dredge Primary Care Provider: Ranae Plumber Other Clinician: Referring Provider: Ranae Plumber Treating Provider/Extender: Tito Dine in Treatment: 20 Constitutional Sitting or standing Blood Pressure is within target range for patient.. Pulse regular and within target range for patient.Marland Kitchen Respirations regular, non- labored and within target range.. Temperature is normal and within the target range for the patient.Marland Kitchen appears in no distress. Cardiovascular Dorsalis pedis pulses are palpable. Edema present in  both extremities.Classic lymphedema. Notes Wound exam; left anterior lower leg just above the ankle. 96 healthy looking granulation tissue. There is no weeping edema fluid no evidence of surrounding infection Electronic Signature(s) Signed: 12/20/2021 11:05:19 AM By: Linton Ham MD Entered By: Linton Ham on 12/19/2021 [place], [name] ([id number]) -------------------------------------------------------------------------------- Physician Orders Details Patient Name: [name] Date of Service: 12/19/2021 1:00 PM Medical Record Number: [id number] Patient Account Number: [id number] Date of Birth/Sex: September 06, 1955 (66 y.o. F) Treating RN: [name] Dredge Primary Care Provider: Ranae Plumber Other Clinician: Referring Provider: Ranae Plumber Treating Provider/Extender: Tito Dine in Treatment: 20 Verbal / Phone Orders: No Diagnosis Coding Follow-up Appointments o Return Appointment in 1 week. o Nurse Visit as needed Bathing/ Shower/ Hygiene o Clean wound with Normal Saline or wound cleanser. o Wash wounds with antibacterial soap and water. o May shower; gently cleanse wound with antibacterial soap, rinse and pat dry prior to dressing wounds o No tub bath. Edema Control - Lymphedema / Segmental Compressive Device / Other o Ace wraps o Elevate, Exercise Daily and Avoid Standing for Long Periods of Time. o Elevate leg(s) parallel to the floor when sitting. o DO YOUR BEST to sleep in the bed at night. DO NOT sleep in your recliner. Long hours of sitting in a recliner leads to swelling of the legs and/or potential wounds on your backside. o Other: - use lymphedema pumps Non-Wound Condition Bilateral Lower Extremities o Cleanse affected area with antibacterial soap and water, o Apply appropriate compression. - ace wrap o Additional non-wound orders/instructions: - silver cell to open weeping areas with abd pad Wound  Treatment Wound #10 - Lower Leg Wound Laterality: Left, Medial, Anterior Cleanser: Normal Saline 1 x Per Day/30 Days Discharge Instructions: Wash your hands with soap and water. Remove old dressing, discard into plastic bag and place into trash. Cleanse the wound with Normal Saline prior to applying a clean dressing using gauze sponges, not tissues or cotton balls. Do not scrub or use excessive force. Pat dry using gauze sponges, not tissue or cotton balls. Cleanser: Soap and Water 1 x Per Day/30 Days Discharge Instructions: Gently cleanse wound with antibacterial soap, rinse and pat dry prior to dressing wounds Primary Dressing: Silvercel 4 1/4x 4 1/4 (in/in) 1 x Per Day/30 Days Discharge Instructions: Apply Silvercel 4 1/4x 4 1/4 (in/in) as instructed Secondary Dressing: ABD Pad 5x9 (in/in) (DME) (Generic) 1 x Per Day/30 Days Discharge Instructions: Cover with ABD pad Secured With: ACE WRAP - 22M ACE Elastic Bandage With VELCRO Brand Closure, 4 (in) 1 x Per Day/30 Days Secured With: Medipore Tape - 22M Medipore H Soft Cloth Surgical Tape, 2x2 (in/yd) 1 x Per Day/30 Days Secured With: Kerlix Roll Sterile or Non-Sterile 6-ply 4.5x4 (yd/yd) (DME) (Generic) 1 x Per Day/30 Days Discharge Instructions: Apply Kerlix as directed  Secured With: chucks pad for drainage PRN 1 x Per Day/30 Days Electronic Signature(s) Signed: 12/19/2021 4:10:46 PM By: [name] Dredge Signed: 12/20/2021 11:05:19 AM By: Linton Ham MD Entered By: [name] Dredge on 12/19/2021 13:37:36 Luna, DAMITA EPPARD ([id number]) ANYELINA, CLAYCOMB ([id number]) -------------------------------------------------------------------------------- Problem List Details Patient Name: YESHA, MUCHOW. Date of Service: 12/19/2021 1:00 PM Medical Record Number: [id number] Patient Account Number: [id number] Date of Birth/Sex: 29-May-1956 (66 y.o. 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 Date of Service: 12/19/2021 1:00 PM Medical Record Number: [id number] Patient Account Number: [id number] Date of Birth/Sex: 03-24-1956 (66 y.o. F) Treating RN: [name] Dredge Primary Care Provider: Ranae Plumber Other Clinician: Referring Provider: Ranae Plumber Treating Provider/Extender: Tito Dine in Treatment: 20 Subjective History of Present Illness (HPI) The patient is a 66 year old female with history of hypertension and a long-standing history of bilateral lower extremity lymphedema (first presented on 4/2) . She has had open ulcers in the past which have always responded to compression therapy. She had briefly been to a lymphedema clinic in the past which helped her at the time. this time around she stopped treatment of her lymphedema pumps approximately 2 weeks ago because of some pain in the knees and then noticed the right leg getting worse. She was seen by her PCP who put her on clindamycin 4 times a day 2 days ago. The patient has seen [facility] and Dr. Delana Meyer had seen her last year where a vascular study including venous and arterial duplex studies were within normal limits. he had recommended  compression stockings and lymphedema pumps and the patient has been using this in about 2 weeks ago. She is known to be diabetic but in the past few time she's gone to her primary care doctor her hemoglobin A1c has been normal. 02/11/2015 - after her last visit she took my advice and went to the ER regarding the progressive cellulitis of her right lower extremity and she was admitted between July 17 and 22nd. She received IV antibiotics and then was sent home on a course of steroid-induced and oral antibiotics. She has improved much since then. 02/17/2015 -- she has been doing fine and the weeping of her legs has remarkably gone down. She has no fresh issues. READMISSION 01/15/18 This patient was given this clinic before most recently in 2016 seen by Dr. Con Memos. She has massive bilateral lymphedema and over the last 2 months this had weeping edema out of the left leg. She has compression pumps but her compliance with these has been minimal. She has [facility] they've been using TCA/ABDs/kerlix under an Ace wrap.she has had recent problems with cellulitis. She was apparently seen in the ER and 12/23/17 and given clindamycin. She was then followed by her primary doctor and given doxycycline and Keflex. The pain seems to have settled down. In April 2018 the patient  had arterial studies done at [facility]. This showed triphasic waveforms throughout the right leg and mostly triphasic waveforms on the left except for monophasic at the posterior tibial artery distally. She was not felt to have evidence of right lower extremity arterial stenosis or significant problems on the left side. 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 She also had a right lower extremity venous Doppler in January 2018 this was limited by the patient's body habitus and lymphedema. Most of the proximal veins were not visualized The patient presents with an area of denuded skin on the  anterior medial part of the left calf. There is weeping edema fluid here. 01/22/18; the patient has somewhat better edema control using her compression pumps twice a day and as a result she has much better epithelialization on the left anterior calf area. Only a small open area remains. 01/29/18; the patient has been compliant with her compression pumps. Both the areas on her calf that healed. The remaining area on the left anterior leg is fully epithelialized Readmission: 02/20/2019 upon evaluation today patient presents for reevaluation due to issues that she is having with the bilateral lower extremities. She actually has wounds open on both legs. On the right she has an area in the crease of her leg on the right around the knee region which is actually draining quite a bit and actually has some fungal type appearance to it. She has been on nystatin powder that seems to have helped to some degree. In regard to the left lower extremity this is actually in the lower portion of her leg closer to the ankle and again is continuing to drain as well unfortunately. There does not appear to be any signs of active infection at this time which is good news. No fevers, chills, nausea, vomiting, or diarrhea. She tells me that since she was seen last year she is actually been doing quite well for the most part with regard to her lower extremities. Unfortunately she now is experiencing a little bit more drainage at this time. She is concerned about getting this under control so that it does not get significantly worse. 02/27/2019 on evaluation today patient appears to be doing somewhat better in regard to her bilateral lower extremity wounds. She has been tolerating the dressing changes without complication. Fortunately there is no signs of active infection at this point. No fevers, chills, nausea, vomiting, or diarrhea. She did get her dressing supplies which is excellent news she was extremely excited to get  these. She also got paperwork from [employer] for their financial assistance program where they may be able to help her out in the future if needed with supplies at discounted prices. 03/06/2019 on evaluation today patient appears to be doing a little worse with regard to both areas of weeping on her bilateral lower extremities. This is around the right medial knee and just above the left ankle. With that being said she is unfortunately not doing as well as I would like to see. I feel like she may need to potentially go see someone at the lymphedema clinic as the wraps that she needs or even beyond what we can do here at the wound care center. She really does not have wounds she just has open areas of weeping that are causing some difficulty for her. Subsequently because of this and the moisture I am concerned about the potential for infection I am going to likely give her a prophylactic  antibiotic today, Keflex, just to be on the safe side. Nonetheless again there is no obvious signs of active infection at this time. 03/13/2019 on evaluation today patient appears to be doing well with regard to her bilateral lower extremities where she has been weeping compared to even last week's evaluation. I see some areas of new skin growth which is excellent and overall I am very pleased with how things seem to be progressing. No fevers, chills, nausea, vomiting, or diarrhea. MIYANI, CRONIC ([id number]) 03/20/2019 on evaluation today patient unfortunately is continuing to have issues with significant edema of the left lower extremity. Her right side seems to be doing much better. Unfortunately her left side is showing increased weeping of the lower portion of her leg. This is quite unfortunate obviously we were hoping to get her into the lymphedema clinic they really do not seem to when I see her how if she is draining. Despite the fact this is really not wound related but more lymphedema weeping related. Nonetheless  I do not know that this can be helpful for her to even go for that appointment since again I am not sure there is much that they would actually do at this point. We may need to try a 4 layer compression wrap as best we can on her leg. She is on the Augmentin currently although I am still concerned about whether or not there could be potentially something going on infection wise I would obtain a culture though I understand is not the best being that is a surface culture I just 1 to make sure I do not seem to be missing anything. 03/27/2019 on evaluation today patient appears to be doing much better in regard to the left lower extremity compared to last week. Last week she had tremendous weeping which I think was subsequent to infection now she seems to be doing much better and very pleased. This is not completely healed but there is a lot of new skin growth and it has dried out quite a bit. Overall I think that we are doing well with how things are moving along at this time. No fevers, chills, nausea, vomiting, or diarrhea. 04/03/2019 evaluation today patient appears to be doing a little worse this week compared to last time I saw her. I think this may be due to the fact that she is having issues with not being able to sleep in her bed at least not until last night. She is therefore been in a lift chair and subsequently has also had issues with not been able to use her pumps since she could not get in bed. With that being said the patient overall seems to be doing okay I do think I may want extend the antibiotic for a little bit longer at least until we can see if her edema and her weeping gets better and if it is then obviously I can always discontinue the antibiotics as of next week however I want her to continue to have it over the next week. 04/10/2019 on evaluation today patient unfortunately is still doing poorly with regard to her left lower extremity. Her right is all things considering doing  fairly well. On the left however she continues to have spreading of the area of infection and weeping which appears to be even a larger surface area than noted last week. She did have a positive culture for Pseudomonas in particular which seems to have been of concern she still has green/yellow discharge consistent  with Pseudomonas and subsequently a tremendous amount of it. This has me obviously still concerned about the infection not really clearing up despite the fact that on culture it appears the Cipro should have been a good option for treating this. I think she may at this point need IV antibiotics since things are not doing better I do not want to get worse and cause sepsis. She is in agreement with the plan and believes as well that she likely does need to go to the hospital for IV vancomycin. Or something of the like depending on what the recommendation is from the ER. 04/17/2019 on evaluation today patient appears to be doing excellent in regard to her lower extremity on the left. She was in the hospital for several days from when I sent her last we saw her until just this past Tuesday. Fortunately her drainage is significantly improved and in fact is mostly clear. There is just a couple small areas that may still drain a little bit she states that the [facility] they prescribed for her at discharge she went picked up from pharmacy and got home but has not been able to find it since. She is looked everywhere. She is wondering if I will replace that for her today I will be more than happy to do that. 05/01/2019 on evaluation today patient actually appears to be doing quite well with regard to her lower extremities. She occasionally is having areas that will leak and then heal up mainly when a piece of the fibrotic skin pops off but fortunately she is not having any signs of active infection at this time. Overall she also really does not have any obvious weeping at this time. I do believe however  she really needs some compression wraps and I think this may be a good time to get her back to the lymphedema clinic. 05/11/2019 on evaluation today patient actually appears to be doing quite well with regard to her bilateral lower extremities. She occasionally will have a small area that we per another but in general seems to be completely healed which is great news. Overall very pleased with how everything seems to be progressing. She does have her appointment with lymphedema clinic on November 18. 05/25/2019 on evaluation today patient appears to be doing well with regard to her left lower extremity. I am very pleased in this regard. In regard to her right leg this actually did start draining more I think it is mainly due to the fact that her leg is more swollen. I am not seeing any obvious signs of infection at this time although that is definitely something were obviously acutely aware of simply due to the fact that she had an issue not too far back with exactly this issue. Nonetheless I do feel like that lymphedema clinic would still be beneficial for her. I explained obviously if they are not able to do anything treatment wise on the right leg we could at least have them treat her left leg and then proceed from there. The patient is really in agreement with that plan. If they are able to do both as the drainage slows down that I would be happy to let them handle both. 06/01/2019 on evaluation today patient unfortunately appears to be doing worse with regard to her right lower extremity. The left lower extremity is still maintaining at this point. Unfortunately she has been having significantly increased pain over the past several days and has been experiencing as well increased swelling  of the right lower extremity. I really do not know that I am seeing anything that appears to be obvious for infection at this point to be peripherally honest. With that being said the patient does seem to be  having much more swelling that she is even experienced in the past and coupled with increased pain in her hip as well I am concerned that again she could potentially have a DVT although I am not 100% sure of this. I think it something that may need to be checked out. We discussed the possibility of sending her for a DVT study through the hospital but unfortunately transportation is an issue if she does have a DVT I do not want her to wait days to be able to get in for that test however if she has this scheduled as an outpatient that is as fast that she will be able to get the test scheduled for transportation purposes. That will also fall on Thanksgiving so subsequently she did actually be looking at either Friday or even next week before we would know anything back from this. That is much too long in my opinion. Subsequent to the amount of discomfort she is experiencing the patient is actually okay with going to the ER for evaluation today. 06/12/2019 on evaluation today patient actually appears to be doing significantly better compared to last time I saw her. Following when I last saw her she was actually in the hospital from that Monday until the following Sunday almost 1 full week. She actually was placed on Keflex in the hospital following the time for her to be discharged and Dr. Steva Ready has recommended 2 times a day dosing of the Keflex for the next year in order to help with more prophylactic/preventative measures with regard to her developing cellulitis. Overall I think this sounds like an excellent plan. The patient unfortunately is good to have trouble being treated at lymphedema clinic due to the fact that she really cannot get up on the bed that they have there. They also state that they cannot manage her as long as she has anything draining at this point. Obviously that is somewhat unfortunate as she does need help with edema control but nonetheless we will have to do what we can for  her outside of it sounds like the lymphedema clinic scenario at this point. 06/19/2019 on evaluation today patient appears to be doing fairly well with regard to her bilateral lower extremities. She is not nearly as swollen and shows no signs of infection at this point. There is no evidence of cellulitis whatsoever. She also has no open wounds or draining at this point which is also good news. No fever chills noted. She seems to be in very good spirits and in fact appears to be doing quite well. READMISSION 11/27/2019 GINI, CAPUTO ([id number]) This is a 66 year old woman that we have had in this clinic several times before including 2015, 16 and 19 and then most recently from 03/20/2019 through 06/19/2019 with bilateral lower extremity lymphedema. She has had previous arterial and reflux studies done years ago which were not all that remarkable. 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 She does have a positive family history and she had large legs starting may be in her 89s. She was recently in hospital from 10/20/2019 through 10/28/2019 with right leg cellulitis. She was given Ancef and clindamycin and then Zosyn when a culture showed Pseudomonas. At that time  there was purulent drainage. She was followed by infectious disease Dr Steva Ready. The patient is now back at home. She has noted increased swelling in the right and no drainage in her right leg mostly on the posterior medial aspect in the calf area. She has not had pain or fever. She has literally been improved lysing above dressings because her at the area of this is far too large for standard compression. She has been wrapping the areas with sheets to resorptive pads. She is found these helped somewhat. She does have an appointment with the lymphedema clinic in [place] in late June. Past medical history includes bilateral lymphedema, hypertension, obstructive sleep apnea with  CPAP. Recent hospitalization with apparently Pseudomonas cellulitis of the right lower leg 12/15/2019 upon evaluation today patient appears to be doing a little bit worse in regard to her right lower extremity. Unfortunately she is having more weeping down in the lower portion of her leg. Fortunately there is no signs of active infection at this time. No fever chills noted. The patient states she is not having increased pain except for when she attempted to use the lymphedema pumps unfortunately she states that she did have pain when she did this. Otherwise we been using absorptive dressings of one type or another she is using diapers at home and then subsequently Ace wraps. In regard to the barrier cream we have discussed the possibility of derma cloud which she would like to try I do not have a problem with that. 12/22/2019 upon evaluation today patient actually appears to be doing better in regard to her leg ulcers at this point. Fortunately there does not appear to be any signs of active infection which is great news and I am extremely pleased with where things are progressing at this time. There is no sign of active infection currently. The patient is very pleased to see things doing so well. 12/29/2019 upon evaluation today patient appears to be doing a little bit better in regard to her weeping in general over her lower extremities. She does have some signs of mild erythema little bit more than what I noted last week or rather last visit. Nonetheless I think that my threshold for switching her antibiotics from Keflex to something else is very low at this point considering that she has had such severe infections in the past that seem to come almost out of nowhere. There is a little erythema and warmth noted of the lower portion of her leg compared to the upper which also makes me want to go ahead and address things more rapidly at this point. Likely I would switch out the Keflex for something  like Levaquin ideally. 7/16; patient with severe bilateral lymphedema. She has superficial wounds albeit almost circumferential now on the left lateral lower leg. This may be new from last time. Small area on the right anterior lower leg and then another area on the right medial lower leg and of pannus fold. She has been using various absorptive garments. She states she is using her compression pumps once a day occasionally twice. Culture from her last visit here was negative 01/29/2020 on evaluation today patient appears to be doing excellent at this point in regard to her legs with regard to infection I see no signs of active infection at this point. She still does have unfortunately areas of weeping this is minimal on the right now her left is actually significantly worse although I do not think it is as bad as  last week with Dr. Dellia Nims saw her. She has been trying to pump and elevate her legs is much as possible. She has previously been on the Keflex and in the past for prevention that seems to do fairly well and likely can extend that today. 02/04/2020 on evaluation today patient appears to be doing better in regard to her legs bilaterally. Fortunately there is no signs of active infection at this time which is great news and overall she has less weeping on the left compared to the right and there is several spots where she is pretty much sealed up with no draining regions. Overall very pleased in this regard. 02/19/2020 on evaluation today patient appears to be doing very well in regard to her wounds currently. Fortunately there is no evidence of active infection overall very pleased with where things stand. She is significantly improved in regard to her edema I am extremely pleased in this regard she tells me that the popping no longer hurts and in fact she actually looks forward to it. 03/04/2020 on evaluation today patient appears to be doing excellent in regard to her lower extremities.  Fortunately there is no signs of active infection at this time. No fevers, chills, nausea, vomiting, or diarrhea. 03/25/2020 on evaluation today patient appears to be doing a little bit more poorly in regard to her legs at this point. She tells me that she is still continue to have issues with drainage and this has been a little bit worse she was getting ready to start taking the Keflex again but wanted to see me first. Fortunately there is no signs of active infection at this time. No fevers, chills, nausea, vomiting, or diarrhea. 04/15/2020 upon evaluation today patient appears to be doing somewhat poorly in regard to her right leg. She tells me she has been having more pain she has been taking the Keflex that was previously prescribed unfortunately that just does not seem to help with this. She was hoping that the pain on her right was actually coming from the fact that she was having issues with her wrap having gotten caught in her recliner. With that being said she tells me that she knew something was not right. Currently her right leg is warm to touch along with being erythematous all the way up to around at least mid thigh as far as I can see. The left leg does not appear to be doing that badly though there is increased weeping around the ankle region. 05/06/2020 on evaluation today patient appears to be doing much better than last time I saw her. She did go to the hospital where she was admitted for 2 days and treated with antibiotic therapy. She was discharged with antibiotics as well and has done extremely well. I am extremely pleased with where things stand today. There is no signs of active infection at this time which is great news. 05/27/2020 upon evaluation today patient appears to be doing well with regard to her lower extremities bilaterally. She has just a very tiny area on the right leg which is opening on the left leg she is significantly improved though she still has several areas  that do appear to be open this is minimal compared to what is been in the past. In general I am extremely pleased with where things stand today. The patient does tell me she is not been using her pumps quite as much as she should be. I do believe that is 1 area she can definitely  work on. She has had a lot going on including a Covid exposure and apparently also a outbreak of likely shingles. 06/24/2020 upon evaluation today patient appears to be doing well with regard to her legs in general although the left leg unfortunately is showing some signs of erythema she does have a little bit of increased weeping and to be honest I am concerned about infection here. I discussed that with her today and I think that we may need to address this sooner rather than later she has been taking Keflex she is not really certain that is been making a big improvement however. No fevers, chills, nausea, vomiting, or diarrhea. DARRIA, CORVERA ([id number]) 06/30/2020 upon evaluation today patient's legs actually seem to be doing better in my opinion as compared to where they were last week. Fortunately there does not appear to be any signs of active infection. Her culture showed multiple organisms nothing predominate. With that being said the Levaquin seems to have done well I think she has improved since I last saw her as well. 07/14/2020 upon evaluation today patient appears to be doing actually better in regard to her lower extremities in my opinion. She has been tolerating the dressing changes without complication. Fortunately there is no signs of active infection at this time. 08/04/2020 upon evaluation today patient appears to be doing excellent in regard to her leg ulcers. Fortunately she has very little that is open at this point. This is great news. In fact I think that the [employer] medicated powder has been excellent for her. It seems to have done the trick where we had tried several other things without as much  success. Fortunately there is no evidence of active infection at this time. No fevers, chills, nausea, vomiting, or diarrhea. 09/01/2020 upon evaluation today patient appears to be doing a little bit more poorly than the last time I saw her. She tells me right now that she has been having a lot of drainage compared to where things were previous which has unfortunately led to more irritation as well. She is concerned that this is leading to infection. Again based on what I am seeing today as well I am also concerned of the same to be honest. 09/15/2020 upon evaluation today patient appears to be doing well with regard to her legs compared to what I saw previous. Fortunately there does not appear to be any evidence of active infection at this time which is great news. At least not as badly as what it was previous. With that being said I do believe that the patient does need to have an extension of the antibiotics. This I believe will actually help her more in the way of making sure this stays under control and does not worsen. That was the Bactrim DS. Readmission: 08/01/2021 upon evaluation today patient appears to be doing actually pretty well all things considered. Is actually been almost a year since have seen her last March. Fortunately I do not see any evidence of active infection locally nor systemically at this point. She does have a couple open areas on the left leg the right leg appears to be doing quite well. In general she has been in the hospital 3 times since I saw her a year ago all 4 cellulitis type issues except for the last 1 which was actually more related to congestive heart failure for that reason she is not using her lymphedema pumps at this point and that is probably the best idea. 08/08/2021  upon evaluation today patient appears to be doing well with regard to her legs all things considered her left leg may be a little bit more swollen based on what I see today. Fortunately I do not  see any signs of active infection locally nor systemically at this time which is great news. No fevers, chills, nausea, vomiting, or diarrhea. 08/15/2021 upon evaluation today patient actually appears to be doing excellent in regard to her wounds. She has been tolerating the dressing changes without complication. Fortunately I see no evidence of infection currently which is great news. No fevers, chills, nausea, vomiting, or diarrhea. 08/29/2021 upon evaluation patient appears to be doing excellent she in fact is almost completely healed. I am actually very pleased with where we stand today and I think that she is making wonderful progress she just has a small area of weeping on the right lateral leg everything else is pretty much completely healed which is also. 09/05/2021 upon evaluation today patient appears to be doing well with regard to her legs. She does have a small area of weeping on the right leg and there is a small area on the left leg as well. It is potentially something that may need to be addressed in the future more specifically but right now I think that there may have just been a little drainage here on the left. With all that being said I think that this still does not appear to be infected and overall I feel like she is doing quite well. That something we always have to keep a very close eye on as she can change very rapidly from okay to not okay. 09/12/2021 upon evaluation today patient appears to be doing a little bit worse in regard to her leg and actually somewhat concerned about the possibility of infection here. I discussed that with the patient. For that reason I am going to go ahead and see about getting her set up for a repeat prescription for the Bactrim. She is in agreement with that plan. 3/14; patient presents for follow-up. She has been using silver alginate with dressing changes. She is still taking Bactrim prescribed at last clinic visit. She has no issues or complaints  today. She has lymphedema pumps however does not use these. 09/26/2020 upon evaluation today patient appears to actually be doing well in regard to her right leg I am pleased in that regard. Unfortunately she has new areas on her left leg that are open at this point that I do need to be addressed. I am also concerned about the warmth around one of the wounds on the more anterior side. I think this is something that we may need to culture today potentially requiring antibiotics going forward. 10/03/2021 upon evaluation today patient unfortunately is not doing nearly as well as she was even last week with her wounds. She is having some issues here with increased cellulitis of the left lower extremity unfortunately. I did prescribe Augmentin for her eczema prescribed last week unfortunately she never actually received this prescription. The pharmacy stated they never got it. We double checked with them today they still did not receive it. I am not sure what happened in that regard. With that being said the patient is in good spirits today she does not appear to be as sick as where she normally is when the cellulitis gets significantly worse as it has in the past. Nonetheless the leg is much more red not just around the ankle where I was  seeing at last week on Tuesday but rather now this is going all the way up to almost her knee and is definitely hot to touch compared to the right leg. Nonetheless I feel like she does need to go to the ER for likely IV antibiotics. 10-10-2021 upon evaluation today patient appears to be doing well with regard to her wound. She has been tolerating the dressing changes that appears to be doing much better. After I saw her last week she did go to the ER they admitted her to the hospital and gave her IV antibiotics she fortunately is doing significantly better. Overall I am extremely pleased with where things stand today. 10-17-2021 upon evaluation today patient appears to be doing  well with regard to her wound. In fact this is looking better and her leg is looking better but she still does have some areas here of erythema. We will continue to address this as soon as possible in my opinion. I do believe she may require some debridement and what appears to be a more defined wound on her left leg at this point. 10-24-2021 upon evaluation patient is definitely showing signs of improvement which is great news. I do not see any evidence of active infection locally or systemically which is great news as well. No fevers, chills, nausea, vomiting, or diarrhea. 10-31-2021. Upon evaluation today patient's leg actually feels better from an infection and wound care perspective. I am actually very pleased in this regard. Unfortunately the biggest issue that I see at this time is that the patient is having a significant issue with her breathing. I did put her on the pulse ox machine to check her oxygen saturation and it was pretty much at 100% which is good but she tells me that when she is walking Dimarzo, Ziara C. ([id number]) this is much more difficult for her. She also tells me that when she is trying to bend over to perform her dressing changes she is so short of breath due to the swelling and fluid in her abdominal area that she is just not been able to do this. She is tearful and very concerned today to be honest. I am truly sorry to see her this way I know she is really having a hard time at the moment. 11-14-2021 upon evaluation patient actually appears to be doing significantly better compared to when I last saw her. She was actually admitted to the hospital I believe this for 6 days total after I sent her on 10-31-2021. Subsequently they actually pulled off greater than 50 pounds of fluid she tells me she feels significantly better her legs are not as tight she actually has some play in the tissue and it is obvious that she is doing much better just from a mobility standpoint as well as  a breathing standpoint. Overall I am extremely happy for her and how she is doing the leg also looks much better on the left. 11-21-2021 upon evaluation today patient appears to be doing well although it does appear that she is going require some sharp debridement the wound is very dry this is the opposite of what its been she was having so much weeping that it was staying extremely wet. Nonetheless we do need to see about going ahead and debriding the wound today. 11-28-2021 upon evaluation today patient appears to be doing well with regard to her wound I definitely see signs of things continuing to improve which is great news. I do not see any  evidence of active infection locally or systemically also great news. 12-05-2021 upon evaluation today patient appears to be doing well with regard to her wound. She has been tolerating the dressing changes. Fortunately there does not appear to be any signs of active infection locally or systemically at this time. No fevers, chills, nausea, vomiting, or diarrhea. 12-12-2021 upon evaluation patient appears to be doing well with regard to her wound this is actually measuring smaller and looking much better. Fortunately I do not see any signs of active infection locally or systemically at this time which is excellent news. 6/13; small wound area in the folds of her lymphedema just above the left ankle. Fortunately the area looks quite good. She has been using silver alginate ABDs and an Ace wrap which she is able to change her self. She is not using her compression pumps out of fear that this could contribute to heart failure. Objective Constitutional Sitting or standing Blood Pressure is within target range for patient.. Pulse regular and within target range for patient.Marland Kitchen Respirations regular, non- labored and within target range.. Temperature is normal and within the target range for the patient.Marland Kitchen appears in no distress. Vitals Time Taken: 1:13 PM, Height: 63  in, Weight: 372 lbs, BMI: 65.9, Temperature: 98.1 F, Pulse: 70 bpm, Respiratory Rate: 20 breaths/min, Blood Pressure: 147/74 mmHg. Cardiovascular Dorsalis pedis pulses are palpable. Edema present in both extremities.Classic lymphedema. General Notes: Wound exam; left anterior lower leg just above the ankle. 96 healthy looking granulation tissue. There is no weeping edema fluid no evidence of surrounding infection Integumentary (Hair, Skin) Wound #10 status is Open. Original cause of wound was Gradually Appeared. The date acquired was: 10/17/2021. The wound has been in treatment 9 weeks. The wound is located on the Left,Medial,Anterior Lower Leg. The wound measures 4.5cm length x 5.5cm width x 0.1cm depth; 19.439cm^2 area and 1.944cm^3 volume. There is Fat Layer (Subcutaneous Tissue) exposed. There is no tunneling or undermining noted. There is a medium amount of serosanguineous drainage noted. There is medium (34-66%) red granulation within the wound bed. There is a small (1-33%) amount of necrotic tissue within the wound bed including Adherent Slough. 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 ([id number]) Plan Follow-up Appointments: Return Appointment in 1 week. Nurse Visit as needed Bathing/ Shower/ Hygiene: Clean wound with Normal Saline or wound cleanser. Wash wounds with antibacterial soap and water. May shower; gently cleanse wound with antibacterial soap, rinse and pat dry prior to dressing wounds No tub bath. Edema Control - Lymphedema / Segmental Compressive Device / Other: Ace wraps Elevate, Exercise Daily and Avoid Standing for Long Periods of Time. Elevate leg(s) parallel to the floor when sitting. DO YOUR BEST to sleep in the bed at night. DO NOT sleep in your  recliner. Long hours of sitting in a recliner leads to swelling of the legs and/or potential wounds on your backside. Other: - use lymphedema pumps Non-Wound Condition: Cleanse affected area with antibacterial soap and water, Apply appropriate compression. - ace wrap Additional non-wound orders/instructions: - silver cell to open weeping areas with abd pad WOUND #10: - Lower Leg Wound Laterality: Left, Medial, Anterior Cleanser: Normal Saline 1 x Per Day/30 Days Discharge Instructions: Wash your hands with soap and water. Remove old dressing, discard into plastic bag and place into trash. Cleanse  the wound with Normal Saline prior to applying a clean dressing using gauze sponges, not tissues or cotton balls. Do not scrub or use excessive force. Pat dry using gauze sponges, not tissue or cotton balls. Cleanser: Soap and Water 1 x Per Day/30 Days Discharge Instructions: Gently cleanse wound with antibacterial soap, rinse and pat dry prior to dressing wounds Primary Dressing: Silvercel 4 1/4x 4 1/4 (in/in) 1 x Per Day/30 Days Discharge Instructions: Apply Silvercel 4 1/4x 4 1/4 (in/in) as instructed Secondary Dressing: ABD Pad 5x9 (in/in) (DME) (Generic) 1 x Per Day/30 Days Discharge Instructions: Cover with ABD pad Secured With: ACE WRAP - 59M ACE Elastic Bandage With VELCRO Brand Closure, 4 (in) 1 x Per Day/30 Days Secured With: Medipore Tape - 59M Medipore H Soft Cloth Surgical Tape, 2x2 (in/yd) 1 x Per Day/30 Days Secured With: [employer] Sterile or Non-Sterile 6-ply 4.5x4 (yd/yd) (DME) (Generic) 1 x Per Day/30 Days Discharge Instructions: Apply Kerlix as directed Secured With: chucks pad for drainage PRN 1 x Per Day/30 Days 1. Probably hereditary lymphedema. She has a wound on her left anterior lower leg which appears to be doing well. 2. She has not been compliant with her compression pumps out of fear related to what she says is "chronic diastolic heart failure". I did talk to her about  this. There is a risk benefit here. Most people do well with compression even those with significant heart failure however I do not think I could talk her into this. My concern would be an extensive wound area, cellulitis etc. 3. Fortunately for the moment things appear to be going well Electronic Signature(s) Signed: 12/20/2021 11:05:19 AM By: Linton Ham MD Entered By: Linton Ham on 12/19/2021 13:47:49 Trefry, [name] ([id number]) -------------------------------------------------------------------------------- SuperBill Details Patient Name: [name]. Date of Service: 12/19/2021 Medical Record Number: [id number] Patient Account Number: [id number] Date of Birth/Sex: 26-Oct-1955 (66 y.o. F) Treating RN: [name] Dredge Primary Care Provider: Ranae Plumber Other Clinician: Referring Provider: Ranae Plumber Treating Provider/Extender: Tito Dine in Treatment: 20 Diagnosis Coding ICD-10 Codes Code Description Q82.0 Hereditary lymphedema L97.811 Non-pressure chronic ulcer of other part of right lower leg limited to breakdown of skin L97.821 Non-pressure chronic ulcer of other part of left lower leg limited to breakdown of skin Facility Procedures CPT4 Code: [id number] Description: 99213 - WOUND CARE VISIT-LEV 3 EST PT Modifier: Quantity: 1 Physician Procedures CPT4 Code: 8502774 Description: 12878 - WC PHYS LEVEL 3 - EST PT Modifier: Quantity: 1 CPT4 Code: Description: ICD-10 Diagnosis Description Q82.0 Hereditary lymphedema L97.821 Non-pressure chronic ulcer of other part of left lower leg limited to brea Modifier: kdown of skin Quantity: Electronic Signature(s) Signed: 12/19/2021 1:54:07 PM By: [name] Dredge Signed: 12/20/2021 11:05:19 AM By: Linton Ham MD Entered By: [name] Dredge on 12/19/2021 13:54:06

## 2021-12-25 ENCOUNTER — Other Ambulatory Visit: Payer: Self-pay

## 2021-12-26 ENCOUNTER — Encounter: Payer: Medicare HMO | Admitting: Physician Assistant

## 2021-12-26 DIAGNOSIS — L97811 Non-pressure chronic ulcer of other part of right lower leg limited to breakdown of skin: Secondary | ICD-10-CM | POA: Diagnosis not present

## 2021-12-26 NOTE — Progress Notes (Signed)
LILA, LUFKIN ([id number]) Visit Report for 12/26/2021 Chief Complaint Document Details Patient Name: SANDRINE, BLOODSWORTH. Date of Service: 12/26/2021 10:30 AM Medical Record Number: [id number] Patient Account Number: [id number] Date of Birth/Sex: 1956/06/09 (66 y.o. F) Treating RN: Cornell Barman Primary Care Provider: Ranae Plumber Other Clinician: Massie Kluver Referring Provider: Ranae Plumber Treating Provider/Extender: Skipper Cliche in Treatment: 21 Information Obtained from: Patient Chief Complaint Bilateral LE lymphedema with Left LE Ulcers Electronic Signature(s) Signed: 12/26/2021 11:00:32 AM By: Worthy Keeler PA-C Entered By: Worthy Keeler on 12/26/2021 11:00:32 Blatchley, [name] ([id number]) -------------------------------------------------------------------------------- Problem List Details Patient Name: [name] Date of Service: 12/26/2021 10:30 AM Medical Record Number: [id number] Patient Account Number: [id number] Date of Birth/Sex: 1956/03/26 (66 y.o. F) Treating RN: Cornell Barman Primary Care Provider: Ranae Plumber Other Clinician: Massie Kluver Referring Provider: Ranae Plumber Treating Provider/Extender: Skipper Cliche in Treatment: 21 Active Problems ICD-10 Encounter Code Description Active Date MDM Diagnosis Q82.0 Hereditary lymphedema 08/01/2021 No Yes L97.811 Non-pressure chronic ulcer of other part of right lower leg limited to 08/01/2021 No Yes breakdown of skin L97.821 Non-pressure chronic ulcer of other part of left lower leg limited to 08/01/2021 No Yes breakdown of skin Inactive Problems Resolved Problems Electronic Signature(s) Signed: 12/26/2021 11:00:29 AM By: Worthy Keeler PA-C Entered By: Worthy Keeler on 12/26/2021 11:00:29

## 2021-12-28 NOTE — Progress Notes (Signed)
LUNETTE, TAPP ([id number]) Visit Report for 12/26/2021 Arrival Information Details Patient Name: [name], [name]. Date of Service: 12/26/2021 10:30 AM Medical Record Number: [id number] Patient Account Number: [id number] Date of Birth/Sex: January 10, 1956 (66 y.o. F) Treating RN: Cornell Barman Primary Care Avenly Roberge: Ranae Plumber Other Clinician: Massie Kluver Referring Heavenleigh Petruzzi: Ranae Plumber Treating Sahithi Ordoyne/Extender: Skipper Cliche in Treatment: 21 Visit Information History Since Last Visit Added or deleted any medications: No Patient Arrived: [name] Had a fall or experienced change in No Arrival Time: 10:57 activities of daily living that may affect Accompanied By: self risk of falls: Transfer Assistance: None Has Dressing in Place as Prescribed: Yes Patient Identification Verified: Yes Pain Present Now: No Secondary Verification Process Completed: Yes Electronic Signature(s) Signed: 12/26/2021 12:21:47 PM By: Gretta Cool, BSN, RN, CWS, Kim RN, BSN Entered By: Gretta Cool, BSN, RN, CWS, Kim on 12/26/2021 10:57:46 [name] ([id number]) -------------------------------------------------------------------------------- Clinic Level of Care Assessment Details Patient Name: [name]. Date of Service: 12/26/2021 10:30 AM Medical Record Number: [id number] Patient Account Number: [id number] Date of Birth/Sex: 31-Jul-1955 (66 y.o. F) Treating RN: Cornell Barman Primary Care Makynleigh Breslin: Ranae Plumber Other Clinician: Massie Kluver Referring Shalandria Elsbernd: Ranae Plumber Treating Ayaansh Smail/Extender: Skipper Cliche in Treatment: 21 Clinic Level of Care Assessment Items TOOL 4 Quantity Score '[]'$  - Use when only an EandM is performed on FOLLOW-UP visit 0 ASSESSMENTS - Nursing Assessment / Reassessment X - Reassessment of Co-morbidities (includes updates in patient status) 1 10 X- 1 5 Reassessment of Adherence to Treatment Plan ASSESSMENTS - Wound and Skin Assessment / Reassessment X -  Simple Wound Assessment / Reassessment - one wound 1 5 '[]'$  - 0 Complex Wound Assessment / Reassessment - multiple wounds '[]'$  - 0 Dermatologic / Skin Assessment (not related to wound area) ASSESSMENTS - Focused Assessment '[]'$  - Circumferential Edema Measurements - multi extremities 0 '[]'$  - 0 Nutritional Assessment / Counseling / Intervention '[]'$  - 0 Lower Extremity Assessment (monofilament, tuning fork, pulses) '[]'$  - 0 Peripheral Arterial Disease Assessment (using hand held doppler) ASSESSMENTS - Ostomy and/or Continence Assessment and Care '[]'$  - Incontinence Assessment and Management 0 '[]'$  - 0 Ostomy Care Assessment and Management (repouching, etc.) PROCESS - Coordination of Care X - Simple Patient / Family Education for ongoing care 1 15 '[]'$  - 0 Complex (extensive) Patient / Family Education for ongoing care X- 1 10 Staff obtains Consents, Records, Test Results / Process Orders '[]'$  - 0 Staff telephones HHA, Nursing Homes / Clarify orders / etc '[]'$  - 0 Routine Transfer to another Facility (non-emergent condition) '[]'$  - 0 Routine Hospital Admission (non-emergent condition) '[]'$  - 0 New Admissions / Biomedical engineer / Ordering NPWT, Apligraf, etc. '[]'$  - 0 Emergency Hospital Admission (emergent condition) X- 1 10 Simple Discharge Coordination '[]'$  - 0 Complex (extensive) Discharge Coordination PROCESS - Special Needs '[]'$  - Pediatric / Minor Patient Management 0 '[]'$  - 0 Isolation Patient Management '[]'$  - 0 Hearing / Language / Visual special needs '[]'$  - 0 Assessment of Community assistance (transportation, D/C planning, etc.) '[]'$  - 0 Additional assistance / Altered mentation '[]'$  - 0 Support Surface(s) Assessment (bed, cushion, seat, etc.) INTERVENTIONS - Wound Cleansing / Measurement Billman, Iley C. ([id number]) X- 1 5 Simple Wound Cleansing - one wound '[]'$  - 0 Complex Wound Cleansing - multiple wounds X- 1 5 Wound Imaging (photographs - any number of wounds) '[]'$  - 0 Wound  Tracing (instead of photographs) X- 1 5 Simple Wound Measurement - one wound '[]'$  - 0 Complex  Wound Measurement - multiple wounds INTERVENTIONS - Wound Dressings '[]'$  - Small Wound Dressing one or multiple wounds 0 '[]'$  - 0 Medium Wound Dressing one or multiple wounds X- 1 20 Large Wound Dressing one or multiple wounds '[]'$  - 0 Application of Medications - topical '[]'$  - 0 Application of Medications - injection INTERVENTIONS - Miscellaneous '[]'$  - External ear exam 0 '[]'$  - 0 Specimen Collection (cultures, biopsies, blood, body fluids, etc.) '[]'$  - 0 Specimen(s) / Culture(s) sent or taken to Lab for analysis '[]'$  - 0 Patient Transfer (multiple staff / Civil Service fast streamer / Similar devices) '[]'$  - 0 Simple Staple / Suture removal (25 or less) '[]'$  - 0 Complex Staple / Suture removal (26 or more) '[]'$  - 0 Hypo / Hyperglycemic Management (close monitor of Blood Glucose) '[]'$  - 0 Ankle / Brachial Index (ABI) - do not check if billed separately X- 1 5 Vital Signs Has the patient been seen at the hospital within the last three years: Yes Total Score: 95 Level Of Care: New/Established - Level 3 Electronic Signature(s) Signed: 12/28/2021 4:35:11 PM By: Massie Kluver Entered By: Massie Kluver on 12/26/2021 12:52:48 Berrett, [name] ([id number]) -------------------------------------------------------------------------------- Encounter Discharge Information Details Patient Name: [name]. Date of Service: 12/26/2021 10:30 AM Medical Record Number: [id number] Patient Account Number: [id number] Date of Birth/Sex: 06/10/1956 (66 y.o. F) Treating RN: Cornell Barman Primary Care Fraidy Mccarrick: Ranae Plumber Other Clinician: Massie Kluver Referring Jefferson Fullam: Ranae Plumber Treating Alvin Rubano/Extender: Skipper Cliche in Treatment: 21 Encounter Discharge Information Items Discharge Condition: Stable Ambulatory Status: Ambulatory Discharge Destination: Home Transportation: Private Auto Accompanied By:  self Schedule Follow-up Appointment: Yes Clinical Summary of Care: Electronic Signature(s) Signed: 12/28/2021 4:35:11 PM By: Massie Kluver Entered By: Massie Kluver on 12/26/2021 12:54:12 [place], [name] ([id number]) -------------------------------------------------------------------------------- Lower Extremity Assessment Details Patient Name: [name] Date of Service: 12/26/2021 10:30 AM Medical Record Number: [id number] Patient Account Number: [id number] Date of Birth/Sex: [birth year] (66 y.o. F) Treating RN: Cornell Barman Primary Care Philis Doke: Ranae Plumber Other Clinician: Massie Kluver Referring Rayona Sardinha: Ranae Plumber Treating Ruthell Feigenbaum/Extender: Skipper Cliche in Treatment: 21 Edema Assessment Assessed: [Left: Yes] [Right: Yes] Edema: [Left: Yes] [Right: Yes] Calf Left: Right: Point of Measurement: 39 cm From Medial Instep 90 cm 90 cm Ankle Left: Right: Point of Measurement: 9 cm From Medial Instep 55 cm 56 cm Vascular Assessment Pulses: Dorsalis Pedis Palpable: [Left:Yes] [Right:Yes] Electronic Signature(s) Signed: 12/26/2021 12:21:47 PM By: Gretta Cool, BSN, RN, CWS, Kim RN, BSN Entered By: Gretta Cool, BSN, RN, CWS, Kim on 12/26/2021 11:03:29 [name] ([id number]) -------------------------------------------------------------------------------- Multi Wound Chart Details Patient Name: [name]. Date of Service: 12/26/2021 10:30 AM Medical Record Number: [id number] Patient Account Number: [id number] Date of Birth/Sex: June 08, 1956 (66 y.o. F) Treating RN: Cornell Barman Primary Care Nixon Kolton: Ranae Plumber Other Clinician: Massie Kluver Referring Nailea Whitehorn: Ranae Plumber Treating Zaylan Kissoon/Extender: Skipper Cliche in Treatment: 21 Vital Signs Height(in): 42 Pulse(bpm): 33 Weight(lbs): 409 Blood Pressure(mmHg): 133/84 Body Mass Index(BMI): 65.9 Temperature(F): 98.2 Respiratory Rate(breaths/min): 18 Photos: [N/A:N/A] Wound Location: Left,  Medial, Anterior Lower Leg N/A N/A Wounding Event: Gradually Appeared N/A N/A Primary Etiology: Lymphedema N/A N/A Comorbid History: Cataracts, Chronic sinus N/A N/A problems/congestion, Anemia, Lymphedema, Asthma, Sleep Apnea, Tuberculosis, Angina, Hypertension, Gout, Osteoarthritis Date Acquired: 10/17/2021 N/A N/A Weeks of Treatment: 10 N/A N/A Wound Status: Open N/A N/A Wound Recurrence: No N/A N/A Measurements L x W x D (cm) 4x5x0.1 N/A N/A Area (cm) : 15.708 N/A N/A Volume (cm) :  1.571 N/A N/A % Reduction in Area: -100.00% N/A N/A % Reduction in Volume: -100.10% N/A N/A Classification: Full Thickness Without Exposed N/A N/A Support Structures Exudate Amount: Medium N/A N/A Exudate Type: Serosanguineous N/A N/A Exudate Color: red, brown N/A N/A Granulation Amount: Medium (34-66%) N/A N/A Granulation Quality: Red N/A N/A Necrotic Amount: Small (1-33%) N/A N/A Exposed Structures: Fat Layer (Subcutaneous Tissue): N/A N/A Yes Epithelialization: Medium (34-66%) N/A N/A Treatment Notes Electronic Signature(s) Signed: 12/28/2021 4:35:11 PM By: Massie Kluver Entered By: Massie Kluver on 12/26/2021 11:20:24 [name] ([id number]) -------------------------------------------------------------------------------- [place] Details Patient Name: [name]. Date of Service: 12/26/2021 10:30 AM Medical Record Number: [id number] Patient Account Number: [id number] Date of Birth/Sex: 1955-12-10 (66 y.o. F) Treating RN: Cornell Barman Primary Care Esraa Seres: Ranae Plumber Other Clinician: Massie Kluver Referring Maysin Carstens: Ranae Plumber Treating Durant Scibilia/Extender: Skipper Cliche in Treatment: 21 Active Inactive Wound/Skin Impairment Nursing Diagnoses: Impaired tissue integrity Knowledge deficit related to ulceration/compromised skin integrity Goals: Ulcer/skin breakdown will have a volume reduction of 30% by week 4 Date Initiated:  08/01/2021 Target Resolution Date: 08/29/2021 Goal Status: Active Ulcer/skin breakdown will have a volume reduction of 50% by week 8 Date Initiated: 08/01/2021 Target Resolution Date: 09/26/2021 Goal Status: Active Ulcer/skin breakdown will have a volume reduction of 80% by week 12 Date Initiated: 08/01/2021 Target Resolution Date: 10/24/2021 Goal Status: Active Ulcer/skin breakdown will heal within 14 weeks Date Initiated: 08/01/2021 Target Resolution Date: 11/07/2021 Goal Status: Active Interventions: Assess patient/caregiver ability to obtain necessary supplies Assess patient/caregiver ability to perform ulcer/skin care regimen upon admission and as needed Assess ulceration(s) every visit Provide education on ulcer and skin care Notes: Electronic Signature(s) Signed: 12/26/2021 12:21:47 PM By: Gretta Cool, BSN, RN, CWS, Kim RN, BSN Signed: 12/28/2021 4:35:11 PM By: Massie Kluver Entered By: Massie Kluver on 12/26/2021 11:20:16 [place], [name] ([id number]) -------------------------------------------------------------------------------- Pain Assessment Details Patient Name: [name] Date of Service: 12/26/2021 10:30 AM Medical Record Number: [id number] Patient Account Number: [id number] Date of Birth/Sex: April 25, 1956 (66 y.o. F) Treating RN: Cornell Barman Primary Care Sharra Cayabyab: Ranae Plumber Other Clinician: Massie Kluver Referring Malik Paar: Ranae Plumber Treating Kalliope Riesen/Extender: Skipper Cliche in Treatment: 21 Active Problems Location of Pain Severity and Description of Pain Patient Has Paino No Site Locations Pain Management and Medication Current Pain Management: Electronic Signature(s) Signed: 12/26/2021 12:21:47 PM By: Gretta Cool, BSN, RN, CWS, Kim RN, BSN Entered By: Gretta Cool, BSN, RN, CWS, Kim on 12/26/2021 10:58:25 [name] ([id number]) -------------------------------------------------------------------------------- Patient/Caregiver Education Details Patient  Name: [name] Date of Service: 12/26/2021 10:30 AM Medical Record Number: [id number] Patient Account Number: [id number] Date of Birth/Gender: 12/13/1955 (66 y.o. F) Treating RN: Cornell Barman Primary Care Physician: Ranae Plumber Other Clinician: Massie Kluver Referring Physician: Ranae Plumber Treating Physician/Extender: Skipper Cliche in Treatment: 21 Education Assessment Education Provided To: Patient Education Topics Provided Wound/Skin Impairment: Handouts: Other: continue wound care as directed Methods: Explain/Verbal Responses: State content correctly Electronic Signature(s) Signed: 12/28/2021 4:35:11 PM By: Massie Kluver Entered By: Massie Kluver on 12/26/2021 12:53:23 [place], [name] ([id number]) -------------------------------------------------------------------------------- Wound Assessment Details Patient Name: [name] Date of Service: 12/26/2021 10:30 AM Medical Record Number: [id number] Patient Account Number: [id number] Date of Birth/Sex: 1956/04/12 (66 y.o. F) Treating RN: Cornell Barman Primary Care Veva Grimley: Ranae Plumber Other Clinician: Massie Kluver Referring Bailen Geffre: Ranae Plumber Treating Sarahlynn Cisnero/Extender: Skipper Cliche in Treatment: 21 Wound Status Wound Number: 10 Primary Lymphedema Etiology: Wound Location: Left, Medial, Anterior Lower  Leg Wound Open Wounding Event: Gradually Appeared Status: Date Acquired: 10/17/2021 Comorbid Cataracts, Chronic sinus problems/congestion, Anemia, Weeks Of Treatment: 10 History: Lymphedema, Asthma, Sleep Apnea, Tuberculosis, Angina, Clustered Wound: No Hypertension, Gout, Osteoarthritis Photos Wound Measurements Length: (cm) 4 Width: (cm) 5 Depth: (cm) 0.1 Area: (cm) 15.708 Volume: (cm) 1.571 % Reduction in Area: -100% % Reduction in Volume: -100.1% Epithelialization: Medium (34-66%) Wound Description Classification: Full Thickness Without Exposed Support  Structures Exudate Amount: Medium Exudate Type: Serosanguineous Exudate Color: red, brown Foul Odor After Cleansing: No Slough/Fibrino Yes Wound Bed Granulation Amount: Medium (34-66%) Exposed Structure Granulation Quality: Red Fat Layer (Subcutaneous Tissue) Exposed: Yes Necrotic Amount: Small (1-33%) Necrotic Quality: Adherent Slough Treatment Notes Wound #10 (Lower Leg) Wound Laterality: Left, Medial, Anterior Cleanser Normal Saline Discharge Instruction: Wash your hands with soap and water. Remove old dressing, discard into plastic bag and place into trash. Cleanse the wound with Normal Saline prior to applying a clean dressing using gauze sponges, not tissues or cotton balls. Do not scrub or use excessive force. Pat dry using gauze sponges, not tissue or cotton balls. Soap and Water Discharge Instruction: Gently cleanse wound with antibacterial soap, rinse and pat dry prior to dressing wounds Timberman, Takeyah C. ([id number]) Peri-Wound Care Topical Primary Dressing Silvercel 4 1/4x 4 1/4 (in/in) Discharge Instruction: Apply Silvercel 4 1/4x 4 1/4 (in/in) as instructed Secondary Dressing ABD Pad 5x9 (in/in) Discharge Instruction: Cover with ABD pad Secured With ACE WRAP - 36M ACE Elastic Bandage With VELCRO Brand Closure, 4 (in) Medipore Tape - 36M Medipore H Soft Cloth Surgical Tape, 2x2 (in/yd) Kerlix Roll Sterile or Non-Sterile 6-ply 4.5x4 (yd/yd) Discharge Instruction: Apply Kerlix as directed chucks pad for drainage PRN Compression Wrap Compression Stockings Add-Ons Electronic Signature(s) Signed: 12/26/2021 12:21:47 PM By: Gretta Cool, BSN, RN, CWS, Kim RN, BSN Entered By: Gretta Cool, BSN, RN, CWS, Kim on 12/26/2021 11:00:29 [name] ([id number]) -------------------------------------------------------------------------------- [place] Details Patient Name: [name]. Date of Service: 12/26/2021 10:30 AM Medical Record Number: [id number] Patient Account Number:  [id number] Date of Birth/Sex: 1956-01-09 (66 y.o. F) Treating RN: Cornell Barman Primary Care Alvar Malinoski: Ranae Plumber Other Clinician: Massie Kluver Referring Azan Maneri: Ranae Plumber Treating Elisandra Deshmukh/Extender: Skipper Cliche in Treatment: 21 Vital Signs Time Taken: 10:57 Temperature (F): 98.2 Height (in): 63 Pulse (bpm): 69 Weight (lbs): 372 Respiratory Rate (breaths/min): 18 Body Mass Index (BMI): 65.9 Blood Pressure (mmHg): 133/84 Reference Range: 80 - 120 mg / dl Electronic Signature(s) Signed: 12/26/2021 12:21:47 PM By: Gretta Cool, BSN, RN, CWS, Kim RN, BSN Entered By: Gretta Cool, BSN, RN, CWS, Kim on 12/26/2021 10:58:08

## 2022-01-02 ENCOUNTER — Ambulatory Visit: Payer: Medicare HMO | Admitting: Physician Assistant

## 2022-01-02 NOTE — Progress Notes (Signed)
Patient ID: [name], female    DOB: 27-Dec-1955, 66 y.o.   MRN: [id number]  HPI  [name] is a 66 y/o female with a history of asthma, HTN, arthritis, lymphedema, sleep apnea, spinal stenosis, obesity, previous tobacco use and chronic heart failure.   Echo report from 11/01/21 reviewed and showed an EF of 60-65%.   Admitted 10/31/21 due to worsening shortness of breath, edema and orthopnea. Initially given IV lasix with transition to oral diuretics with addition of twice weekly metolazone. Cardiology consult obtained. Discharged after 6 days.   She presents today for a follow-up visit with a chief complaint of moderate fatigue with little exertion. Describes this as chronic in nature. She has associated chest tightness, cough, shortness of breath, pedal edema (worsening), anxiety and difficulty sleeping along with this. She denies any dizziness, chest pain, palpitations or abdominal distention.   Voices frustration with her weight as she feels like she's doing "everything right" with her diet. Is very disappointed with today's office weight.   Does notice her lymphedema has worsened since her diuretic was decreased due to worsening renal function. Has occasionally taken an additional 1/2 tablet when she feels really swollen.   She has been trying to weigh herself using 2 scales as her lymphedema is so severe that she is unable to stand on one small digital scale at home. She currently has 1 foot on 1 scale and another foot on the other scale at home but says that the weight fluctuates 10-15 pounds. She confirms that the scales are on a flat surface.   Past Medical History:  Diagnosis Date   Arthritis    Asthma    CHF (congestive heart failure) ([place])    Enlarged heart    Hypertension    Lymphedema    Sleep apnea    Spinal stenosis    Past Surgical History:  Procedure Laterality Date   COLONOSCOPY WITH PROPOFOL N/A 03/12/2018   Procedure: COLONOSCOPY WITH PROPOFOL;  Surgeon:  Manya Silvas, MD;  Location: [facility] ENDOSCOPY;  Service: Endoscopy;  Laterality: N/A;   NO PAST SURGERIES     Family History  Problem Relation Age of Onset   Thyroid disease Other    Breast cancer Maternal Aunt    Social History   Tobacco Use   Smoking status: Former    Packs/day: 0.25    Types: Cigarettes    Start date: 07/09/1976    Quit date: 07/09/1990    Years since quitting: 31.5   Smokeless tobacco: Current    Types: Snuff  Substance Use Topics   Alcohol use: No   Allergies  Allergen Reactions   Ace Inhibitors Hives and Swelling   Ibuprofen Hives and Swelling   Vancomycin Itching and Nausea And Vomiting   Prior to Admission medications   Medication Sig Start Date End Date Taking? Authorizing Provider  atenolol (TENORMIN) 25 MG tablet Take 1 tablet (25 mg total) by mouth daily. 11/06/21 02/04/22 Yes Enzo Bi, MD  cetirizine (ZYRTEC) 10 MG tablet Take 1 tablet (10 mg total) by mouth daily. 12/25/20  Yes Lavina Hamman, MD  furosemide (LASIX) 40 MG tablet Take 0.5 tablets (20 mg total) by mouth daily. 11/23/21 02/21/22 Yes Ezekeil Bethel, Otila Kluver A, FNP  metolazone (ZAROXOLYN) 2.5 MG tablet Take 1 tab every Tue and Sat 30 minutes prior Lasix. 11/06/21  Yes Enzo Bi, MD  potassium chloride SA (KLOR-CON M) 20 MEQ tablet Take 2 tablets (40 mEq total) by mouth daily. 11/28/21  Yes Rochella Benner A, FNP  senna-docusate (SENOKOT-S) 8.6-50 MG tablet Take 1 tablet by mouth 2 (two) times daily.   Yes [provider]  acetaminophen (TYLENOL) 500 MG tablet Take 500-1,000 mg by mouth every 6 (six) hours as needed for mild pain or fever. Patient not taking: Reported on 01/03/2022    [provider]    Review of Systems  Constitutional:  Positive for fatigue. Negative for appetite change.  HENT:  Positive for postnasal drip. Negative for congestion and sore throat.   Eyes: Negative.   Respiratory:  Positive for cough (dry), chest tightness ("discomfort at times") and shortness of  breath (with moderate exertion).   Cardiovascular:  Positive for leg swelling (worsening). Negative for chest pain and palpitations.  Gastrointestinal:  Negative for abdominal distention, abdominal pain, constipation and diarrhea.  Endocrine: Negative.   Genitourinary: Negative.   Musculoskeletal:  Negative for back pain and neck pain.  Skin: Negative.        Severe lymphedema  Allergic/Immunologic: Negative.   Neurological:  Negative for dizziness and light-headedness.  Hematological:  Negative for adenopathy. Does not bruise/bleed easily.  Psychiatric/Behavioral:  Positive for sleep disturbance (chronic trouble falling asleep; sleeps in recliner). Negative for dysphoric mood. The patient is nervous/anxious (due to increased weight on our scale).    Vitals:   01/03/22 1245  BP: 136/76  Pulse: 76  Resp: 18  SpO2: 98%  Weight: (!) 390 lb (176.9 kg)  Height: '5\' 3"'$  (1.6 m)   Wt Readings from Last 3 Encounters:  01/03/22 (!) 390 lb (176.9 kg)  11/22/21 (!) 375 lb 6 oz (170.3 kg)  11/06/21 (!) 361 lb 8 oz (164 kg)   Lab Results  Component Value Date   CREATININE 1.69 (H) 01/03/2022   CREATININE 1.46 (H) 12/12/2021   CREATININE 1.54 (H) 11/28/2021   Physical Exam Vitals and nursing note reviewed.  Constitutional:      Appearance: She is well-developed.  HENT:     Head: Normocephalic and atraumatic.  Cardiovascular:     Rate and Rhythm: Normal rate and regular rhythm.  Pulmonary:     Effort: Pulmonary effort is normal. No accessory muscle usage.     Breath sounds: No wheezing, rhonchi or rales.  Abdominal:     Palpations: Abdomen is soft.     Tenderness: There is no abdominal tenderness.  Musculoskeletal:     Cervical back: Normal range of motion and neck supple.     Right lower leg: Edema present.     Left lower leg: Edema present.     Comments: Severe lymphedema  Skin:    General: Skin is warm and dry.  Neurological:     General: No focal deficit present.     Mental  Status: She is alert and oriented to person, place, and time.  Psychiatric:        Mood and Affect: Mood is depressed.        Behavior: Behavior normal.   Assessment & Plan:  1: Acute on Chronic heart failure with preserved ejection fraction without structural changes- - NYHA class III - euvolemic today - attempting to weigh daily with standing on 2 scales but it's been fluctuating quite a bit; she's going to look into finding a wide enough scale for her to stand on - weight up 15 pounds from last visit here 6 weeks ago; confirmed with a re-weigh - feels like her edema has worsened since diuretic decreased; will check BMP today and adjust diuretic after  getting results back - discussed furoscix and she's interested so she watched the video while in the office and handled the demo; 2 boxes provided and explained to NOT use it until she hears from [place]; patient verbalized understanding - discussed making nephrology referral to help with fluid management; renal function may be worse due to worsening fluid retention - may need to give adjusting furosemide dose - limiting her sodium intake but occasionally adds "a pinch" of salt to meets; does not like [name], garlic powder etc - understands to keep daily fluid intake to 60-64 ounces/ day - facial/mouth swelling with ACEi - saw cardiology Margarito Courser) 11/15/21 - BNP 10/31/21 was 123.9  2: HTN- - BP mildly elevated (136/76) - sees PCP Daryel Gerald) @ [facility] and she has to call and schedule an appointment - BMP 12/12/21 reviewed and showed sodium 138, potassium 3.6, creatinine 1.46 and GFR 39 - will check BMP today  3: Lymphedema- - went to wound center 12/26/21 for leg wrapping - feels like her lymphedema is worsening since diuretic had been decreased - checking BMP today before adjusting diuretic  4: Sleep apnea- - wearing CPAP nightly   Medication bottles reviewed. When she takes an extra 1/2 tablet of furosemide, she does take an extra  potassium. Does not take any additional potassium on metolazone days.   Return in 1 week, will get labs again next week since furoscix will probably be used.

## 2022-01-03 ENCOUNTER — Ambulatory Visit: Payer: Medicare HMO | Attending: Family | Admitting: Family

## 2022-01-03 ENCOUNTER — Encounter: Payer: Self-pay | Admitting: Family

## 2022-01-03 VITALS — BP 136/76 | HR 76 | Resp 18 | Ht 63.0 in | Wt 390.0 lb

## 2022-01-03 DIAGNOSIS — F419 Anxiety disorder, unspecified: Secondary | ICD-10-CM | POA: Diagnosis not present

## 2022-01-03 DIAGNOSIS — I890 Lymphedema, not elsewhere classified: Secondary | ICD-10-CM | POA: Diagnosis not present

## 2022-01-03 DIAGNOSIS — I110 Hypertensive heart disease with heart failure: Secondary | ICD-10-CM | POA: Diagnosis present

## 2022-01-03 DIAGNOSIS — M1990 Unspecified osteoarthritis, unspecified site: Secondary | ICD-10-CM | POA: Diagnosis not present

## 2022-01-03 DIAGNOSIS — R5383 Other fatigue: Secondary | ICD-10-CM | POA: Insufficient documentation

## 2022-01-03 DIAGNOSIS — Z87891 Personal history of nicotine dependence: Secondary | ICD-10-CM | POA: Insufficient documentation

## 2022-01-03 DIAGNOSIS — G4733 Obstructive sleep apnea (adult) (pediatric): Secondary | ICD-10-CM

## 2022-01-03 DIAGNOSIS — J45909 Unspecified asthma, uncomplicated: Secondary | ICD-10-CM | POA: Diagnosis not present

## 2022-01-03 DIAGNOSIS — Z9989 Dependence on other enabling machines and devices: Secondary | ICD-10-CM

## 2022-01-03 DIAGNOSIS — I5033 Acute on chronic diastolic (congestive) heart failure: Secondary | ICD-10-CM | POA: Diagnosis present

## 2022-01-03 DIAGNOSIS — G4730 Sleep apnea, unspecified: Secondary | ICD-10-CM | POA: Diagnosis not present

## 2022-01-03 DIAGNOSIS — R0789 Other chest pain: Secondary | ICD-10-CM | POA: Insufficient documentation

## 2022-01-03 DIAGNOSIS — E669 Obesity, unspecified: Secondary | ICD-10-CM | POA: Insufficient documentation

## 2022-01-03 DIAGNOSIS — R220 Localized swelling, mass and lump, head: Secondary | ICD-10-CM | POA: Insufficient documentation

## 2022-01-03 DIAGNOSIS — I10 Essential (primary) hypertension: Secondary | ICD-10-CM | POA: Diagnosis not present

## 2022-01-03 LAB — BASIC METABOLIC PANEL
Anion gap: 11 (ref 5–15)
BUN: 42 mg/dL — ABNORMAL HIGH (ref 8–23)
CO2: 25 mmol/L (ref 22–32)
Calcium: 9.3 mg/dL (ref 8.9–10.3)
Chloride: 103 mmol/L (ref 98–111)
Creatinine, Ser: 1.69 mg/dL — ABNORMAL HIGH (ref 0.44–1.00)
GFR, Estimated: 33 mL/min — ABNORMAL LOW (ref >60)
Glucose, Bld: 97 mg/dL (ref 70–99)
Potassium: 4.1 mmol/L (ref 3.5–5.1)
Sodium: 139 mmol/L (ref 135–145)

## 2022-01-03 NOTE — Patient Instructions (Addendum)
  If you have voicemail, please make sure your mailbox is cleaned out so that we may leave a message and please make sure to listen to any voicemails.    If you receive a satisfaction survey regarding the [facility], please take the time to fill it out. This way we can continue to provide excellent care and make any changes that need to be made.    Provided patient education on Furoscix using demo kits and Furoscix video, QR code provided on AVS for further viewing.    Your provider has order Furoscix for you. This is an on-body infuser that gives you a dose of Furosemide.   It will be shipped to your home once ordered at next visit  [employer] will call you to discuss before shipping so, PLEASE answer unknown calls  For questions regarding the device call [employer] at [phone]  Ensure you write down the time you start your infusion so that if there is a problem you will know how long the infusion lasted  Use Furoscix only AS DIRECTED by our office  Dosing Directions:   Checking lab work today and will call you after getting results to discuss how much extra potassium to take and when to use the furoscix

## 2022-01-04 ENCOUNTER — Telehealth: Payer: Self-pay | Admitting: Family

## 2022-01-04 DIAGNOSIS — N1832 Chronic kidney disease, stage 3b: Secondary | ICD-10-CM

## 2022-01-04 DIAGNOSIS — I5033 Acute on chronic diastolic (congestive) heart failure: Secondary | ICD-10-CM

## 2022-01-04 NOTE — Telephone Encounter (Signed)
Reviewed BMP results from yesterday. Advised her to use 1 dose of furoscix today with an additional 33mq of potassium. She will [place] the morning to discuss using the second one or her normal metolazone over the weekend.   Will also make nephrology referral to aid in diuresis with her CKD. Patient is agreeable to this and verbalized understanding of above.

## 2022-01-05 ENCOUNTER — Telehealth: Payer: Self-pay | Admitting: Family

## 2022-01-05 MED ORDER — FUROSCIX 80 MG/10ML ~~LOC~~ CTKT: 80.0000 mg | CARTRIDGE | SUBCUTANEOUS | 0 refills | Status: DC

## 2022-01-05 NOTE — Telephone Encounter (Signed)
Patient called to update on symptoms after using furoscix yesterday. She said that she didn't experience any side effects/issues with using it and says that she urinated a lot. She also says that she doesn't feel as swollen behind her knees as she didn't when she was here the other day.   She is supposed to take metolazone tomorrow but advised her to not use the  metolazone but to use the 2nd dose of furoscix instead. She is to take an additional 5mq potassium tomorrow with the furoscix like she did yesterday.   Will check labs at her next visit and send in the order for the furoscix. Patient verbalized understanding.

## 2022-01-11 ENCOUNTER — Encounter: Payer: Medicare HMO | Attending: Physician Assistant | Admitting: Physician Assistant

## 2022-01-11 ENCOUNTER — Telehealth: Payer: Self-pay | Admitting: Family

## 2022-01-11 ENCOUNTER — Other Ambulatory Visit
Admission: RE | Admit: 2022-01-11 | Discharge: 2022-01-11 | Disposition: A | Discharge status: Home / Self Care | Payer: Medicare HMO | Source: Ambulatory Visit | Attending: Family | Admitting: Family

## 2022-01-11 ENCOUNTER — Encounter: Payer: Self-pay | Admitting: Family

## 2022-01-11 ENCOUNTER — Ambulatory Visit: Payer: Medicare HMO | Attending: Family | Admitting: Family

## 2022-01-11 VITALS — BP 115/53 | HR 83 | Resp 18 | Ht 63.0 in | Wt 390.0 lb

## 2022-01-11 DIAGNOSIS — R0602 Shortness of breath: Secondary | ICD-10-CM | POA: Diagnosis present

## 2022-01-11 DIAGNOSIS — L97821 Non-pressure chronic ulcer of other part of left lower leg limited to breakdown of skin: Secondary | ICD-10-CM | POA: Diagnosis not present

## 2022-01-11 DIAGNOSIS — G4733 Obstructive sleep apnea (adult) (pediatric): Secondary | ICD-10-CM | POA: Diagnosis not present

## 2022-01-11 DIAGNOSIS — L97811 Non-pressure chronic ulcer of other part of right lower leg limited to breakdown of skin: Secondary | ICD-10-CM | POA: Insufficient documentation

## 2022-01-11 DIAGNOSIS — R5383 Other fatigue: Secondary | ICD-10-CM | POA: Insufficient documentation

## 2022-01-11 DIAGNOSIS — Z9989 Dependence on other enabling machines and devices: Secondary | ICD-10-CM | POA: Diagnosis not present

## 2022-01-11 DIAGNOSIS — I110 Hypertensive heart disease with heart failure: Secondary | ICD-10-CM | POA: Insufficient documentation

## 2022-01-11 DIAGNOSIS — G4730 Sleep apnea, unspecified: Secondary | ICD-10-CM | POA: Insufficient documentation

## 2022-01-11 DIAGNOSIS — R0789 Other chest pain: Secondary | ICD-10-CM | POA: Diagnosis not present

## 2022-01-11 DIAGNOSIS — I10 Essential (primary) hypertension: Secondary | ICD-10-CM

## 2022-01-11 DIAGNOSIS — I5033 Acute on chronic diastolic (congestive) heart failure: Secondary | ICD-10-CM | POA: Diagnosis not present

## 2022-01-11 DIAGNOSIS — I5032 Chronic diastolic (congestive) heart failure: Secondary | ICD-10-CM | POA: Insufficient documentation

## 2022-01-11 DIAGNOSIS — I890 Lymphedema, not elsewhere classified: Secondary | ICD-10-CM | POA: Diagnosis not present

## 2022-01-11 DIAGNOSIS — Z87891 Personal history of nicotine dependence: Secondary | ICD-10-CM | POA: Diagnosis not present

## 2022-01-11 DIAGNOSIS — Q820 Hereditary lymphedema: Secondary | ICD-10-CM | POA: Diagnosis not present

## 2022-01-11 LAB — BASIC METABOLIC PANEL
Anion gap: 11 (ref 5–15)
BUN: 40 mg/dL — ABNORMAL HIGH (ref 8–23)
CO2: 25 mmol/L (ref 22–32)
Calcium: 9.1 mg/dL (ref 8.9–10.3)
Chloride: 104 mmol/L (ref 98–111)
Creatinine, Ser: 1.23 mg/dL — ABNORMAL HIGH (ref 0.44–1.00)
GFR, Estimated: 48 mL/min — ABNORMAL LOW (ref >60)
Glucose, Bld: 87 mg/dL (ref 70–99)
Potassium: 3.8 mmol/L (ref 3.5–5.1)
Sodium: 140 mmol/L (ref 135–145)

## 2022-01-11 MED ORDER — FUROSEMIDE 40 MG PO TABS: ORAL_TABLET | ORAL | 2 refills | Status: DC

## 2022-01-11 NOTE — Telephone Encounter (Signed)
Spoke with patient regarding BMP results obtained earlier today. Renal function is improving and potassium level is normal.   Advised her that she could use the furoscix dose tomorrow with an additional 74mq potassium  Will recheck labs again at next visit. She verbalized understanding

## 2022-01-11 NOTE — Patient Instructions (Addendum)
If you have voicemail, please make sure your mailbox is cleaned out so that we may leave a message and please make sure to listen to any voicemails.   Change your fluid pill to '40mg'$  every other day alternating with '20mg'$  every other day

## 2022-01-11 NOTE — Progress Notes (Addendum)
[name] ([id number]) Visit Report for 01/11/2022 Chief Complaint Document Details Patient Name: [name], [name]. Date of Service: 01/11/2022 11:15 AM Medical Record Number: [id number] Patient Account Number: [id number] Date of Birth/Sex: [birth year] (66 y.o. F) Treating RN: Cornell Barman Primary Care Provider: Ranae Plumber Other Clinician: Massie Kluver Referring Provider: Ranae Plumber Treating Provider/Extender: Skipper Cliche in Treatment: 23 Information Obtained from: Patient Chief Complaint Bilateral LE lymphedema with Left LE Ulcers Electronic Signature(s) Signed: 01/11/2022 11:03:09 AM By: Worthy Keeler PA-C Entered By: Worthy Keeler on 01/11/2022 11:03:08 [name] ([id number]) -------------------------------------------------------------------------------- HPI Details Patient Name: [name] Date of Service: 01/11/2022 11:15 AM Medical Record Number: [id number] Patient Account Number: [id number] Date of Birth/Sex: 04-Aug-1955 (66 y.o. F) Treating RN: Cornell Barman Primary Care Provider: Ranae Plumber Other Clinician: Massie Kluver Referring Provider: Ranae Plumber Treating Provider/Extender: Skipper Cliche in Treatment: 23 History of Present Illness HPI Description: The patient is a 66 year old female with history of hypertension and a long-standing history of bilateral lower extremity lymphedema (first presented on 4/2) . She has had open ulcers in the past which have always responded to compression therapy. She had briefly been to a lymphedema clinic in the past which helped her at the time. this time around she stopped treatment of her lymphedema pumps approximately 2 weeks ago because of some pain in the knees and then noticed the right leg getting worse. She was seen by her PCP who put her on clindamycin 4 times a day 2 days ago. The patient has seen [facility] and Dr. Delana Meyer had seen her last year where a vascular study including venous and  arterial duplex studies were within normal limits. he had recommended compression stockings and lymphedema pumps and the patient has been using this in about 2 weeks ago. She is known to be diabetic but in the past few time she's gone to her primary care doctor her hemoglobin A1c has been normal. 02/11/2015 - after her last visit she took my advice and went to the ER regarding the progressive cellulitis of her right lower extremity and she was admitted between July 17 and 22nd. She received IV antibiotics and then was sent home on a course of steroid-induced and oral antibiotics. She has improved much since then. 02/17/2015 -- she has been doing fine and the weeping of her legs has remarkably gone down. She has no fresh issues. READMISSION 01/15/18 This patient was given this clinic before most recently in 2016 seen by Dr. Con Memos. She has massive bilateral lymphedema and over the last 2 months this had weeping edema out of the left leg. She has compression pumps but her compliance with these has been minimal. She has [facility] they've been using TCA/ABDs/kerlix under an Ace wrap.she has had recent problems with cellulitis. She was apparently seen in the ER and 12/23/17 and given clindamycin. She was then followed by her primary doctor and given doxycycline and Keflex. The pain seems to have settled down. In April 2018 the patient had arterial studies done at [facility]. This showed triphasic waveforms throughout the right leg and mostly triphasic waveforms on the left except for monophasic at the posterior tibial artery distally. She was not felt to have evidence of right lower extremity arterial stenosis or significant problems on the left side. 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 She also had a right lower extremity venous Doppler in January 2018 this was limited  by the patient's body habitus and lymphedema. Most of the proximal veins were  not visualized The patient presents with an area of denuded skin on the anterior medial part of the left calf. There is weeping edema fluid here. 01/22/18; the patient has somewhat better edema control using her compression pumps twice a day and as a result she has much better epithelialization on the left anterior calf area. Only a small open area remains. 01/29/18; the patient has been compliant with her compression pumps. Both the areas on her calf that healed. The remaining area on the left anterior leg is fully epithelialized Readmission: 02/20/2019 upon evaluation today patient presents for reevaluation due to issues that she is having with the bilateral lower extremities. She actually has wounds open on both legs. On the right she has an area in the crease of her leg on the right around the knee region which is actually draining quite a bit and actually has some fungal type appearance to it. She has been on nystatin powder that seems to have helped to some degree. In regard to the left lower extremity this is actually in the lower portion of her leg closer to the ankle and again is continuing to drain as well unfortunately. There does not appear to be any signs of active infection at this time which is good news. No fevers, chills, nausea, vomiting, or diarrhea. She tells me that since she was seen last year she is actually been doing quite well for the most part with regard to her lower extremities. Unfortunately she now is experiencing a little bit more drainage at this time. She is concerned about getting this under control so that it does not get significantly worse. 02/27/2019 on evaluation today patient appears to be doing somewhat better in regard to her bilateral lower extremity wounds. She has been tolerating the dressing changes without complication. Fortunately there is no signs of active infection at this point. No fevers, chills, nausea, vomiting, or diarrhea. She did get her  dressing supplies which is excellent news she was extremely excited to get these. She also got paperwork from [employer] for their financial assistance program where they may be able to help her out in the future if needed with supplies at discounted prices. 03/06/2019 on evaluation today patient appears to be doing a little worse with regard to both areas of weeping on her bilateral lower extremities. This is around the right medial knee and just above the left ankle. With that being said she is unfortunately not doing as well as I would like to see. I feel like she may need to potentially go see someone at the lymphedema clinic as the wraps that she needs or even beyond what we can do here at the wound care center. She really does not have wounds she just has open areas of weeping that are causing some difficulty for her. Subsequently because of this and the moisture I am concerned about the potential for infection I am going to likely give her a prophylactic antibiotic today, Keflex, just to be on the safe side. Nonetheless again there is no obvious signs of active infection at this time. 03/13/2019 on evaluation today patient appears to be doing well with regard to her bilateral lower extremities where she has been weeping compared to even last week's evaluation. I see some areas of new skin growth which is excellent and overall I am very pleased with how things seem to be progressing. No fevers, chills, nausea,  vomiting, or diarrhea. [name], [name] ([id number]) 03/20/2019 on evaluation today patient unfortunately is continuing to have issues with significant edema of the left lower extremity. Her right side seems to be doing much better. Unfortunately her left side is showing increased weeping of the lower portion of her leg. This is quite unfortunate obviously we were hoping to get her into the lymphedema clinic they really do not seem to when I see her how if she is draining. Despite the fact this is  really not wound related but more lymphedema weeping related. Nonetheless I do not know that this can be helpful for her to even go for that appointment since again I am not sure there is much that they would actually do at this point. We may need to try a 4 layer compression wrap as best we can on her leg. She is on the Augmentin currently although I am still concerned about whether or not there could be potentially something going on infection wise I would obtain a culture though I understand is not the best being that is a surface culture I just 1 to make sure I do not seem to be missing anything. 03/27/2019 on evaluation today patient appears to be doing much better in regard to the left lower extremity compared to last week. Last week she had tremendous weeping which I think was subsequent to infection now she seems to be doing much better and very pleased. This is not completely healed but there is a lot of new skin growth and it has dried out quite a bit. Overall I think that we are doing well with how things are moving along at this time. No fevers, chills, nausea, vomiting, or diarrhea. 04/03/2019 evaluation today patient appears to be doing a little worse this week compared to last time I saw her. I think this may be due to the fact that she is having issues with not being able to sleep in her bed at least not until last night. She is therefore been in a lift chair and subsequently has also had issues with not been able to use her pumps since she could not get in bed. With that being said the patient overall seems to be doing okay I do think I may want extend the antibiotic for a little bit longer at least until we can see if her edema and her weeping gets better and if it is then obviously I can always discontinue the antibiotics as of next week however I want her to continue to have it over the next week. 04/10/2019 on evaluation today patient unfortunately is still doing poorly with regard  to her left lower extremity. Her right is all things considering doing fairly well. On the left however she continues to have spreading of the area of infection and weeping which appears to be even a larger surface area than noted last week. She did have a positive culture for Pseudomonas in particular which seems to have been of concern she still has green/yellow discharge consistent with Pseudomonas and subsequently a tremendous amount of it. This has me obviously still concerned about the infection not really clearing up despite the fact that on culture it appears the Cipro should have been a good option for treating this. I think she may at this point need IV antibiotics since things are not doing better I do not want to get worse and cause sepsis. She is in agreement with the plan and believes as well that  she likely does need to go to the hospital for IV vancomycin. Or something of the like depending on what the recommendation is from the ER. 04/17/2019 on evaluation today patient appears to be doing excellent in regard to her lower extremity on the left. She was in the hospital for several days from when I sent her last we saw her until just this past Tuesday. Fortunately her drainage is significantly improved and in fact is mostly clear. There is just a couple small areas that may still drain a little bit she states that the [facility] they prescribed for her at discharge she went picked up from pharmacy and got home but has not been able to find it since. She is looked everywhere. She is wondering if I will replace that for her today I will be more than happy to do that. 05/01/2019 on evaluation today patient actually appears to be doing quite well with regard to her lower extremities. She occasionally is having areas that will leak and then heal up mainly when a piece of the fibrotic skin pops off but fortunately she is not having any signs of active infection at this time. Overall she also  really does not have any obvious weeping at this time. I do believe however she really needs some compression wraps and I think this may be a good time to get her back to the lymphedema clinic. 05/11/2019 on evaluation today patient actually appears to be doing quite well with regard to her bilateral lower extremities. She occasionally will have a small area that we per another but in general seems to be completely healed which is great news. Overall very pleased with how everything seems to be progressing. She does have her appointment with lymphedema clinic on November 18. 05/25/2019 on evaluation today patient appears to be doing well with regard to her left lower extremity. I am very pleased in this regard. In regard to her right leg this actually did start draining more I think it is mainly due to the fact that her leg is more swollen. I am not seeing any obvious signs of infection at this time although that is definitely something were obviously acutely aware of simply due to the fact that she had an issue not too far back with exactly this issue. Nonetheless I do feel like that lymphedema clinic would still be beneficial for her. I explained obviously if they are not able to do anything treatment wise on the right leg we could at least have them treat her left leg and then proceed from there. The patient is really in agreement with that plan. If they are able to do both as the drainage slows down that I would be happy to let them handle both. 06/01/2019 on evaluation today patient unfortunately appears to be doing worse with regard to her right lower extremity. The left lower extremity is still maintaining at this point. Unfortunately she has been having significantly increased pain over the past several days and has been experiencing as well increased swelling of the right lower extremity. I really do not know that I am seeing anything that appears to be obvious for infection at this point to  be peripherally honest. With that being said the patient does seem to be having much more swelling that she is even experienced in the past and coupled with increased pain in her hip as well I am concerned that again she could potentially have a DVT although I am not 100% sure  of this. I think it something that may need to be checked out. We discussed the possibility of sending her for a DVT study through the hospital but unfortunately transportation is an issue if she does have a DVT I do not want her to wait days to be able to get in for that test however if she has this scheduled as an outpatient that is as fast that she will be able to get the test scheduled for transportation purposes. That will also fall on Thanksgiving so subsequently she did actually be looking at either Friday or even next week before we would know anything back from this. That is much too long in my opinion. Subsequent to the amount of discomfort she is experiencing the patient is actually okay with going to the ER for evaluation today. 06/12/2019 on evaluation today patient actually appears to be doing significantly better compared to last time I saw her. Following when I last saw her she was actually in the hospital from that Monday until the following Sunday almost 1 full week. She actually was placed on Keflex in the hospital following the time for her to be discharged and Dr. Steva Ready has recommended 2 times a day dosing of the Keflex for the next year in order to help with more prophylactic/preventative measures with regard to her developing cellulitis. Overall I think this sounds like an excellent plan. The patient unfortunately is good to have trouble being treated at lymphedema clinic due to the fact that she really cannot get up on the bed that they have there. They also state that they cannot manage her as long as she has anything draining at this point. Obviously that is somewhat unfortunate as she does need help  with edema control but nonetheless we will have to do what we can for her outside of it sounds like the lymphedema clinic scenario at this point. 06/19/2019 on evaluation today patient appears to be doing fairly well with regard to her bilateral lower extremities. She is not nearly as swollen and shows no signs of infection at this point. There is no evidence of cellulitis whatsoever. She also has no open wounds or draining at this point which is also good news. No fever chills noted. She seems to be in very good spirits and in fact appears to be doing quite well. READMISSION 11/27/2019 [name], [name] ([id number]) This is a 66 year old woman that we have had in this clinic several times before including 2015, 16 and 19 and then most recently from 03/20/2019 through 06/19/2019 with bilateral lower extremity lymphedema. She has had previous arterial and reflux studies done years ago which were not all that remarkable. 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 She does have a positive family history and she had large legs starting may be in her 99s. She was recently in hospital from 10/20/2019 through 10/28/2019 with right leg cellulitis. She was given Ancef and clindamycin and then Zosyn when a culture showed Pseudomonas. At that time there was purulent drainage. She was followed by infectious disease Dr Steva Ready. The patient is now back at home. She has noted increased swelling in the right and no drainage in her right leg mostly on the posterior medial aspect in the calf area. She has not had pain or fever. She has literally been improved lysing above dressings because her at the area of this is far too large for standard compression. She has been wrapping the areas  with sheets to resorptive pads. She is found these helped somewhat. She does have an appointment with the lymphedema clinic in [place] in late June. Past medical history  includes bilateral lymphedema, hypertension, obstructive sleep apnea with CPAP. Recent hospitalization with apparently Pseudomonas cellulitis of the right lower leg 12/15/2019 upon evaluation today patient appears to be doing a little bit worse in regard to her right lower extremity. Unfortunately she is having more weeping down in the lower portion of her leg. Fortunately there is no signs of active infection at this time. No fever chills noted. The patient states she is not having increased pain except for when she attempted to use the lymphedema pumps unfortunately she states that she did have pain when she did this. Otherwise we been using absorptive dressings of one type or another she is using diapers at home and then subsequently Ace wraps. In regard to the barrier cream we have discussed the possibility of derma cloud which she would like to try I do not have a problem with that. 12/22/2019 upon evaluation today patient actually appears to be doing better in regard to her leg ulcers at this point. Fortunately there does not appear to be any signs of active infection which is great news and I am extremely pleased with where things are progressing at this time. There is no sign of active infection currently. The patient is very pleased to see things doing so well. 12/29/2019 upon evaluation today patient appears to be doing a little bit better in regard to her weeping in general over her lower extremities. She does have some signs of mild erythema little bit more than what I noted last week or rather last visit. Nonetheless I think that my threshold for switching her antibiotics from Keflex to something else is very low at this point considering that she has had such severe infections in the past that seem to come almost out of nowhere. There is a little erythema and warmth noted of the lower portion of her leg compared to the upper which also makes me want to go ahead and address things more  rapidly at this point. Likely I would switch out the Keflex for something like Levaquin ideally. 7/16; patient with severe bilateral lymphedema. She has superficial wounds albeit almost circumferential now on the left lateral lower leg. This may be new from last time. Small area on the right anterior lower leg and then another area on the right medial lower leg and of pannus fold. She has been using various absorptive garments. She states she is using her compression pumps once a day occasionally twice. Culture from her last visit here was negative 01/29/2020 on evaluation today patient appears to be doing excellent at this point in regard to her legs with regard to infection I see no signs of active infection at this point. She still does have unfortunately areas of weeping this is minimal on the right now her left is actually significantly worse although I do not think it is as bad as last week with Dr. Dellia Nims saw her. She has been trying to pump and elevate her legs is much as possible. She has previously been on the Keflex and in the past for prevention that seems to do fairly well and likely can extend that today. 02/04/2020 on evaluation today patient appears to be doing better in regard to her legs bilaterally. Fortunately there is no signs of active infection at this time which is great news and overall  she has less weeping on the left compared to the right and there is several spots where she is pretty much sealed up with no draining regions. Overall very pleased in this regard. 02/19/2020 on evaluation today patient appears to be doing very well in regard to her wounds currently. Fortunately there is no evidence of active infection overall very pleased with where things stand. She is significantly improved in regard to her edema I am extremely pleased in this regard she tells me that the popping no longer hurts and in fact she actually looks forward to it. 03/04/2020 on evaluation today patient  appears to be doing excellent in regard to her lower extremities. Fortunately there is no signs of active infection at this time. No fevers, chills, nausea, vomiting, or diarrhea. 03/25/2020 on evaluation today patient appears to be doing a little bit more poorly in regard to her legs at this point. She tells me that she is still continue to have issues with drainage and this has been a little bit worse she was getting ready to start taking the Keflex again but wanted to see me first. Fortunately there is no signs of active infection at this time. No fevers, chills, nausea, vomiting, or diarrhea. 04/15/2020 upon evaluation today patient appears to be doing somewhat poorly in regard to her right leg. She tells me she has been having more pain she has been taking the Keflex that was previously prescribed unfortunately that just does not seem to help with this. She was hoping that the pain on her right was actually coming from the fact that she was having issues with her wrap having gotten caught in her recliner. With that being said she tells me that she knew something was not right. Currently her right leg is warm to touch along with being erythematous all the way up to around at least mid thigh as far as I can see. The left leg does not appear to be doing that badly though there is increased weeping around the ankle region. 05/06/2020 on evaluation today patient appears to be doing much better than last time I saw her. She did go to the hospital where she was admitted for 2 days and treated with antibiotic therapy. She was discharged with antibiotics as well and has done extremely well. I am extremely pleased with where things stand today. There is no signs of active infection at this time which is great news. 05/27/2020 upon evaluation today patient appears to be doing well with regard to her lower extremities bilaterally. She has just a very tiny area on the right leg which is opening on the left leg  she is significantly improved though she still has several areas that do appear to be open this is minimal compared to what is been in the past. In general I am extremely pleased with where things stand today. The patient does tell me she is not been using her pumps quite as much as she should be. I do believe that is 1 area she can definitely work on. She has had a lot going on including a Covid exposure and apparently also a outbreak of likely shingles. 06/24/2020 upon evaluation today patient appears to be doing well with regard to her legs in general although the left leg unfortunately is showing some signs of erythema she does have a little bit of increased weeping and to be honest I am concerned about infection here. I discussed that with her today and I think that we  may need to address this sooner rather than later she has been taking Keflex she is not really certain that is been making a big improvement however. No fevers, chills, nausea, vomiting, or diarrhea. [name], [name] ([id number]) 06/30/2020 upon evaluation today patient's legs actually seem to be doing better in my opinion as compared to where they were last week. Fortunately there does not appear to be any signs of active infection. Her culture showed multiple organisms nothing predominate. With that being said the Levaquin seems to have done well I think she has improved since I last saw her as well. 07/14/2020 upon evaluation today patient appears to be doing actually better in regard to her lower extremities in my opinion. She has been tolerating the dressing changes without complication. Fortunately there is no signs of active infection at this time. 08/04/2020 upon evaluation today patient appears to be doing excellent in regard to her leg ulcers. Fortunately she has very little that is open at this point. This is great news. In fact I think that the Gold[employer] medicated powder has been excellent for her. It seems to have done the  trick where we had tried several other things without as much success. Fortunately there is no evidence of active infection at this time. No fevers, chills, nausea, vomiting, or diarrhea. 09/01/2020 upon evaluation today patient appears to be doing a little bit more poorly than the last time I saw her. She tells me right now that she has been having a lot of drainage compared to where things were previous which has unfortunately led to more irritation as well. She is concerned that this is leading to infection. Again based on what I am seeing today as well I am also concerned of the same to be honest. 09/15/2020 upon evaluation today patient appears to be doing well with regard to her legs compared to what I saw previous. Fortunately there does not appear to be any evidence of active infection at this time which is great news. At least not as badly as what it was previous. With that being said I do believe that the patient does need to have an extension of the antibiotics. This I believe will actually help her more in the way of making sure this stays under control and does not worsen. That was the Bactrim DS. Readmission: 08/01/2021 upon evaluation today patient appears to be doing actually pretty well all things considered. Is actually been almost a year since have seen her last March. Fortunately I do not see any evidence of active infection locally nor systemically at this point. She does have a couple open areas on the left leg the right leg appears to be doing quite well. In general she has been in the hospital 3 times since I saw her a year ago all 4 cellulitis type issues except for the last 1 which was actually more related to congestive heart failure for that reason she is not using her lymphedema pumps at this point and that is probably the best idea. 08/08/2021 upon evaluation today patient appears to be doing well with regard to her legs all things considered her left leg may be a little bit  more swollen based on what I see today. Fortunately I do not see any signs of active infection locally nor systemically at this time which is great news. No fevers, chills, nausea, vomiting, or diarrhea. 08/15/2021 upon evaluation today patient actually appears to be doing excellent in regard to her wounds. She has  been tolerating the dressing changes without complication. Fortunately I see no evidence of infection currently which is great news. No fevers, chills, nausea, vomiting, or diarrhea. 08/29/2021 upon evaluation patient appears to be doing excellent she in fact is almost completely healed. I am actually very pleased with where we stand today and I think that she is making wonderful progress she just has a small area of weeping on the right lateral leg everything else is pretty much completely healed which is also. 09/05/2021 upon evaluation today patient appears to be doing well with regard to her legs. She does have a small area of weeping on the right leg and there is a small area on the left leg as well. It is potentially something that may need to be addressed in the future more specifically but right now I think that there may have just been a little drainage here on the left. With all that being said I think that this still does not appear to be infected and overall I feel like she is doing quite well. That something we always have to keep a very close eye on as she can change very rapidly from okay to not okay. 09/12/2021 upon evaluation today patient appears to be doing a little bit worse in regard to her leg and actually somewhat concerned about the possibility of infection here. I discussed that with the patient. For that reason I am going to go ahead and see about getting her set up for a repeat prescription for the Bactrim. She is in agreement with that plan. 3/14; patient presents for follow-up. She has been using silver alginate with dressing changes. She is still taking Bactrim  prescribed at last clinic visit. She has no issues or complaints today. She has lymphedema pumps however does not use these. 09/26/2020 upon evaluation today patient appears to actually be doing well in regard to her right leg I am pleased in that regard. Unfortunately she has new areas on her left leg that are open at this point that I do need to be addressed. I am also concerned about the warmth around one of the wounds on the more anterior side. I think this is something that we may need to culture today potentially requiring antibiotics going forward. 10/03/2021 upon evaluation today patient unfortunately is not doing nearly as well as she was even last week with her wounds. She is having some issues here with increased cellulitis of the left lower extremity unfortunately. I did prescribe Augmentin for her eczema prescribed last week unfortunately she never actually received this prescription. The pharmacy stated they never got it. We double checked with them today they still did not receive it. I am not sure what happened in that regard. With that being said the patient is in good spirits today she does not appear to be as sick as where she normally is when the cellulitis gets significantly worse as it has in the past. Nonetheless the leg is much more red not just around the ankle where I was seeing at last week on Tuesday but rather now this is going all the way up to almost her knee and is definitely hot to touch compared to the right leg. Nonetheless I feel like she does need to go to the ER for likely IV antibiotics. 10-10-2021 upon evaluation today patient appears to be doing well with regard to her wound. She has been tolerating the dressing changes that appears to be doing much better. After I saw  her last week she did go to the ER they admitted her to the hospital and gave her IV antibiotics she fortunately is doing significantly better. Overall I am extremely pleased with where things stand  today. 10-17-2021 upon evaluation today patient appears to be doing well with regard to her wound. In fact this is looking better and her leg is looking better but she still does have some areas here of erythema. We will continue to address this as soon as possible in my opinion. I do believe she may require some debridement and what appears to be a more defined wound on her left leg at this point. 10-24-2021 upon evaluation patient is definitely showing signs of improvement which is great news. I do not see any evidence of active infection locally or systemically which is great news as well. No fevers, chills, nausea, vomiting, or diarrhea. 10-31-2021. Upon evaluation today patient's leg actually feels better from an infection and wound care perspective. I am actually very pleased in this regard. Unfortunately the biggest issue that I see at this time is that the patient is having a significant issue with her breathing. I did put her on the pulse ox machine to check her oxygen saturation and it was pretty much at 100% which is good but she tells me that when she is walking [name], [name] ([id number]) this is much more difficult for her. She also tells me that when she is trying to bend over to perform her dressing changes she is so short of breath due to the swelling and fluid in her abdominal area that she is just not been able to do this. She is tearful and very concerned today to be honest. I am truly sorry to see her this way I know she is really having a hard time at the moment. 11-14-2021 upon evaluation patient actually appears to be doing significantly better compared to when I last saw her. She was actually admitted to the hospital I believe this for 6 days total after I sent her on 10-31-2021. Subsequently they actually pulled off greater than 50 pounds of fluid she tells me she feels significantly better her legs are not as tight she actually has some play in the tissue and it is obvious that  she is doing much better just from a mobility standpoint as well as a breathing standpoint. Overall I am extremely happy for her and how she is doing the leg also looks much better on the left. 11-21-2021 upon evaluation today patient appears to be doing well although it does appear that she is going require some sharp debridement the wound is very dry this is the opposite of what its been she was having so much weeping that it was staying extremely wet. Nonetheless we do need to see about going ahead and debriding the wound today. 11-28-2021 upon evaluation today patient appears to be doing well with regard to her wound I definitely see signs of things continuing to improve which is great news. I do not see any evidence of active infection locally or systemically also great news. 12-05-2021 upon evaluation today patient appears to be doing well with regard to her wound. She has been tolerating the dressing changes. Fortunately there does not appear to be any signs of active infection locally or systemically at this time. No fevers, chills, nausea, vomiting, or diarrhea. 12-12-2021 upon evaluation patient appears to be doing well with regard to her wound this is actually measuring smaller and looking much  better. Fortunately I do not see any signs of active infection locally or systemically at this time which is excellent news. 6/13; small wound area in the folds of her lymphedema just above the left ankle. Fortunately the area looks quite good. She has been using silver alginate ABDs and an Ace wrap which she is able to change her self. She is not using her compression pumps out of fear that this could contribute to heart failure. 12-26-2021 upon evaluation patient's wounds are actually showing signs of excellent improvement. I am very pleased with where things stand. Overall I think that she has been making excellent progress and I think we are very close to complete resolution which is great  news. 01-11-2022 upon evaluation today patient appears to be doing well currently in regard to her legs in general although on the left leg she does have 1 area that still irritated and inflamed on the anterior portion of her leg. Fortunately I do not see any signs of systemic infection locally there might be some infection going on here however which is my biggest concern. Fortunately I do not see any evidence though of this spreading to any other location. Electronic Signature(s) Signed: 01/11/2022 12:03:05 PM By: Worthy Keeler PA-C Entered By: Worthy Keeler on 01/11/2022 12:03:05 [name] ([id number]) -------------------------------------------------------------------------------- Physical Exam Details Patient Name: JINGER, MIDDLESWORTH C. Date of Service: 01/11/2022 11:15 AM Medical Record Number: [id number] Patient Account Number: [id number] Date of Birth/Sex: 05-06-1956 (66 y.o. F) Treating RN: [name] Dredge Primary Care Provider: Ranae Plumber Other Clinician: Massie Kluver Referring Provider: Ranae Plumber Treating Provider/Extender: Skipper Cliche in Treatment: 23 Constitutional Obese and well-hydrated in no acute distress. Respiratory normal breathing without difficulty. Psychiatric this patient is able to make decisions and demonstrates good insight into disease process. Alert and Oriented x 3. pleasant and cooperative. Notes Upon inspection patient's wound bed actually showed signs of good granulation and epithelization at this point. She does have some irritation and breakdown in regard to the left leg more medial and in one of the creases towards the lower portion of her leg. Fortunately I think this is showing signs of improvement overall as far as the depth of the wound although this irritation is spreading. She has been using some nystatin powder which I think is absolutely okay with that being said I think she may need something a little bit more from the  standpoint of an antibiotic to get this under control and try to prevent this from worsening in general. Electronic Signature(s) Signed: 01/11/2022 12:03:48 PM By: Worthy Keeler PA-C Entered By: Worthy Keeler on 01/11/2022 12:03:48 [name] ([id number]) -------------------------------------------------------------------------------- Physician Orders Details Patient Name: [name]. Date of Service: 01/11/2022 11:15 AM Medical Record Number: [id number] Patient Account Number: [id number] Date of Birth/Sex: 1956/04/08 (66 y.o. F) Treating RN: [name] Dredge Primary Care Provider: Ranae Plumber Other Clinician: Massie Kluver Referring Provider: Ranae Plumber Treating Provider/Extender: Skipper Cliche in Treatment: 23 Verbal / Phone Orders: No Diagnosis Coding ICD-10 Coding Code Description Q82.0 Hereditary lymphedema L97.811 Non-pressure chronic ulcer of other part of right lower leg limited to breakdown of skin L97.821 Non-pressure chronic ulcer of other part of left lower leg limited to breakdown of skin Follow-up Appointments o Return Appointment in 1 week. o Nurse Visit as needed Bathing/ Shower/ Hygiene o Clean wound with Normal Saline or wound cleanser. o Wash wounds with antibacterial soap and water. o May shower; gently cleanse wound  with antibacterial soap, rinse and pat dry prior to dressing wounds o No tub bath. Edema Control - Lymphedema / Segmental Compressive Device / Other o Ace wraps o Elevate, Exercise Daily and Avoid Standing for Long Periods of Time. o Elevate leg(s) parallel to the floor when sitting. o DO YOUR BEST to sleep in the bed at night. DO NOT sleep in your recliner. Long hours of sitting in a recliner leads to swelling of the legs and/or potential wounds on your backside. o Other: - use lymphedema pumps Non-Wound Condition Bilateral Lower Extremities o Cleanse affected area with antibacterial soap and  water, o Apply appropriate compression. - ace wrap o Additional non-wound orders/instructions: - silver cell to open weeping areas with abd pad Medications-Please add to medication list. o P.O. Antibiotics - start on Doxycycline Wound Treatment Wound #10 - Lower Leg Wound Laterality: Left, Medial, Anterior Cleanser: Normal Saline 1 x Per Day/30 Days Discharge Instructions: Wash your hands with soap and water. Remove old dressing, discard into plastic bag and place into trash. Cleanse the wound with Normal Saline prior to applying a clean dressing using gauze sponges, not tissues or cotton balls. Do not scrub or use excessive force. Pat dry using gauze sponges, not tissue or cotton balls. Cleanser: Soap and Water 1 x Per Day/30 Days Discharge Instructions: Gently cleanse wound with antibacterial soap, rinse and pat dry prior to dressing wounds Primary Dressing: Silvercel 4 1/4x 4 1/4 (in/in) (DME) (Generic) 1 x Per Day/30 Days Discharge Instructions: Apply Silvercel 4 1/4x 4 1/4 (in/in) as instructed Secondary Dressing: ABD Pad 5x9 (in/in) (DME) (Generic) 1 x Per Day/30 Days Discharge Instructions: Cover with ABD pad Secured With: ACE WRAP - 67M ACE Elastic Bandage With VELCRO Brand Closure, 4 (in) 1 x Per Day/30 Days Secured With: Medipore Tape - 67M Medipore H Soft Cloth Surgical Tape, 2x2 (in/yd) (DME) (Generic) 1 x Per Day/30 Days Secured With: Kerlix Roll Sterile or Non-Sterile 6-ply 4.5x4 (yd/yd) (Generic) 1 x Per Day/30 Days Discharge Instructions: Apply Kerlix as directed [name], [name] ([id number]) Secured With: chucks pad for drainage PRN 1 x Per Day/30 Days Patient Medications Allergies: ibuprofen, ACE Inhibitors, vancomycin Notifications Medication Indication Start End doxycycline hyclate 01/11/2022 DOSE 1 - oral 100 mg capsule - 1 capsule oral taken 2 times per day for 14 days Electronic Signature(s) Signed: 01/11/2022 12:04:59 PM By: Worthy Keeler PA-C Entered By: Worthy Keeler on 01/11/2022 12:04:58 Sian, Ellamae [name] ([id number]) -------------------------------------------------------------------------------- Problem List Details Patient Name: [name]. Date of Service: 01/11/2022 11:15 AM Medical Record Number: [id number] Patient Account Number: [id number] Date of Birth/Sex: [birth year] (66 y.o. F) Treating RN: Cornell Barman Primary Care Provider: Ranae Plumber Other Clinician: Massie Kluver Referring Provider: Ranae Plumber Treating Provider/Extender: Skipper Cliche in Treatment: 23 Active Problems ICD-10 Encounter Code Description Active Date MDM Diagnosis Q82.0 Hereditary lymphedema 08/01/2021 No Yes L97.811 Non-pressure chronic ulcer of other part of right lower leg limited to 08/01/2021 No Yes breakdown of skin L97.821 Non-pressure chronic ulcer of other part of left lower leg limited to 08/01/2021 No Yes breakdown of skin Inactive Problems Resolved Problems Electronic Signature(s) Signed: 01/11/2022 11:03:03 AM By: Worthy Keeler PA-C Entered By: Worthy Keeler on 01/11/2022 11:03:03 Greenman, Ellamae [name] ([id number]) -------------------------------------------------------------------------------- Progress Note Details Patient Name: [name]. Date of Service: 01/11/2022 11:15 AM Medical Record Number: [id number] Patient Account Number: [id number] Date of Birth/Sex: 1956/05/07 (66 y.o. F) Treating RN: [name] Dredge Primary Care Provider: Daryel Gerald ,  Vaughan Basta Other Clinician: Massie Kluver Referring Provider: Ranae Plumber Treating Provider/Extender: Skipper Cliche in Treatment: 23 Subjective Chief Complaint Information obtained from Patient Bilateral LE lymphedema with Left LE Ulcers History of Present Illness (HPI) The patient is a 66 year old female with history of hypertension and a long-standing history of bilateral lower extremity lymphedema (first presented on 4/2) . She has had open ulcers in the past which have  always responded to compression therapy. She had briefly been to a lymphedema clinic in the past which helped her at the time. this time around she stopped treatment of her lymphedema pumps approximately 2 weeks ago because of some pain in the knees and then noticed the right leg getting worse. She was seen by her PCP who put her on clindamycin 4 times a day 2 days ago. The patient has seen [facility] and Dr. Delana Meyer had seen her last year where a vascular study including venous and arterial duplex studies were within normal limits. he had recommended compression stockings and lymphedema pumps and the patient has been using this in about 2 weeks ago. She is known to be diabetic but in the past few time she's gone to her primary care doctor her hemoglobin A1c has been normal. 02/11/2015 - after her last visit she took my advice and went to the ER regarding the progressive cellulitis of her right lower extremity and she was admitted between July 17 and 22nd. She received IV antibiotics and then was sent home on a course of steroid-induced and oral antibiotics. She has improved much since then. 02/17/2015 -- she has been doing fine and the weeping of her legs has remarkably gone down. She has no fresh issues. READMISSION 01/15/18 This patient was given this clinic before most recently in 2016 seen by Dr. Con Memos. She has massive bilateral lymphedema and over the last 2 months this had weeping edema out of the left leg. She has compression pumps but her compliance with these has been minimal. She has [facility] they've been using TCA/ABDs/kerlix under an Ace wrap.she has had recent problems with cellulitis. She was apparently seen in the ER and 12/23/17 and given clindamycin. She was then followed by her primary doctor and given doxycycline and Keflex. The pain seems to have settled down. In April 2018 the patient had arterial studies done at [facility]. This showed triphasic waveforms  throughout the right leg and mostly triphasic waveforms on the left except for monophasic at the posterior tibial artery distally. She was not felt to have evidence of right lower extremity arterial stenosis or significant problems on the left side. 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 She also had a right lower extremity venous Doppler in January 2018 this was limited by the patient's body habitus and lymphedema. Most of the proximal veins were not visualized The patient presents with an area of denuded skin on the anterior medial part of the left calf. There is weeping edema fluid here. 01/22/18; the patient has somewhat better edema control using her compression pumps twice a day and as a result she has much better epithelialization on the left anterior calf area. Only a small open area remains. 01/29/18; the patient has been compliant with her compression pumps. Both the areas on her calf that healed. The remaining area on the left anterior leg is fully epithelialized Readmission: 02/20/2019 upon evaluation today patient presents for reevaluation due to issues that she is having with the bilateral lower extremities.  She actually has wounds open on both legs. On the right she has an area in the crease of her leg on the right around the knee region which is actually draining quite a bit and actually has some fungal type appearance to it. She has been on nystatin powder that seems to have helped to some degree. In regard to the left lower extremity this is actually in the lower portion of her leg closer to the ankle and again is continuing to drain as well unfortunately. There does not appear to be any signs of active infection at this time which is good news. No fevers, chills, nausea, vomiting, or diarrhea. She tells me that since she was seen last year she is actually been doing quite well for the most part with regard to her lower extremities. Unfortunately she now  is experiencing a little bit more drainage at this time. She is concerned about getting this under control so that it does not get significantly worse. 02/27/2019 on evaluation today patient appears to be doing somewhat better in regard to her bilateral lower extremity wounds. She has been tolerating the dressing changes without complication. Fortunately there is no signs of active infection at this point. No fevers, chills, nausea, vomiting, or diarrhea. She did get her dressing supplies which is excellent news she was extremely excited to get these. She also got paperwork from [employer] for their financial assistance program where they may be able to help her out in the future if needed with supplies at discounted prices. 03/06/2019 on evaluation today patient appears to be doing a little worse with regard to both areas of weeping on her bilateral lower extremities. This is around the right medial knee and just above the left ankle. With that being said she is unfortunately not doing as well as I would like to see. I feel like she may need to potentially go see someone at the lymphedema clinic as the wraps that she needs or even beyond what we can do here at the wound care center. She really does not have wounds she just has open areas of weeping that are causing some difficulty for her. Subsequently because of this and the moisture I am concerned about the potential for infection I am going to likely give her a prophylactic antibiotic today, Keflex, just to be on the safe side. Nonetheless again there is no obvious signs of active infection at this time. LARYA, [name] ([id number]) 03/13/2019 on evaluation today patient appears to be doing well with regard to her bilateral lower extremities where she has been weeping compared to even last week's evaluation. I see some areas of new skin growth which is excellent and overall I am very pleased with how things seem to be progressing. No fevers, chills,  nausea, vomiting, or diarrhea. 03/20/2019 on evaluation today patient unfortunately is continuing to have issues with significant edema of the left lower extremity. Her right side seems to be doing much better. Unfortunately her left side is showing increased weeping of the lower portion of her leg. This is quite unfortunate obviously we were hoping to get her into the lymphedema clinic they really do not seem to when I see her how if she is draining. Despite the fact this is really not wound related but more lymphedema weeping related. Nonetheless I do not know that this can be helpful for her to even go for that appointment since again I am not sure there is much that they would actually  do at this point. We may need to try a 4 layer compression wrap as best we can on her leg. She is on the Augmentin currently although I am still concerned about whether or not there could be potentially something going on infection wise I would obtain a culture though I understand is not the best being that is a surface culture I just 1 to make sure I do not seem to be missing anything. 03/27/2019 on evaluation today patient appears to be doing much better in regard to the left lower extremity compared to last week. Last week she had tremendous weeping which I think was subsequent to infection now she seems to be doing much better and very pleased. This is not completely healed but there is a lot of new skin growth and it has dried out quite a bit. Overall I think that we are doing well with how things are moving along at this time. No fevers, chills, nausea, vomiting, or diarrhea. 04/03/2019 evaluation today patient appears to be doing a little worse this week compared to last time I saw her. I think this may be due to the fact that she is having issues with not being able to sleep in her bed at least not until last night. She is therefore been in a lift chair and subsequently has also had issues with not been able to  use her pumps since she could not get in bed. With that being said the patient overall seems to be doing okay I do think I may want extend the antibiotic for a little bit longer at least until we can see if her edema and her weeping gets better and if it is then obviously I can always discontinue the antibiotics as of next week however I want her to continue to have it over the next week. 04/10/2019 on evaluation today patient unfortunately is still doing poorly with regard to her left lower extremity. Her right is all things considering doing fairly well. On the left however she continues to have spreading of the area of infection and weeping which appears to be even a larger surface area than noted last week. She did have a positive culture for Pseudomonas in particular which seems to have been of concern she still has green/yellow discharge consistent with Pseudomonas and subsequently a tremendous amount of it. This has me obviously still concerned about the infection not really clearing up despite the fact that on culture it appears the Cipro should have been a good option for treating this. I think she may at this point need IV antibiotics since things are not doing better I do not want to get worse and cause sepsis. She is in agreement with the plan and believes as well that she likely does need to go to the hospital for IV vancomycin. Or something of the like depending on what the recommendation is from the ER. 04/17/2019 on evaluation today patient appears to be doing excellent in regard to her lower extremity on the left. She was in the hospital for several days from when I sent her last we saw her until just this past Tuesday. Fortunately her drainage is significantly improved and in fact is mostly clear. There is just a couple small areas that may still drain a little bit she states that the [facility] they prescribed for her at discharge she went picked up from pharmacy and got home but has not  been able to find it since. She is looked  everywhere. She is wondering if I will replace that for her today I will be more than happy to do that. 05/01/2019 on evaluation today patient actually appears to be doing quite well with regard to her lower extremities. She occasionally is having areas that will leak and then heal up mainly when a piece of the fibrotic skin pops off but fortunately she is not having any signs of active infection at this time. Overall she also really does not have any obvious weeping at this time. I do believe however she really needs some compression wraps and I think this may be a good time to get her back to the lymphedema clinic. 05/11/2019 on evaluation today patient actually appears to be doing quite well with regard to her bilateral lower extremities. She occasionally will have a small area that we per another but in general seems to be completely healed which is great news. Overall very pleased with how everything seems to be progressing. She does have her appointment with lymphedema clinic on November 18. 05/25/2019 on evaluation today patient appears to be doing well with regard to her left lower extremity. I am very pleased in this regard. In regard to her right leg this actually did start draining more I think it is mainly due to the fact that her leg is more swollen. I am not seeing any obvious signs of infection at this time although that is definitely something were obviously acutely aware of simply due to the fact that she had an issue not too far back with exactly this issue. Nonetheless I do feel like that lymphedema clinic would still be beneficial for her. I explained obviously if they are not able to do anything treatment wise on the right leg we could at least have them treat her left leg and then proceed from there. The patient is really in agreement with that plan. If they are able to do both as the drainage slows down that I would be happy to let  them handle both. 06/01/2019 on evaluation today patient unfortunately appears to be doing worse with regard to her right lower extremity. The left lower extremity is still maintaining at this point. Unfortunately she has been having significantly increased pain over the past several days and has been experiencing as well increased swelling of the right lower extremity. I really do not know that I am seeing anything that appears to be obvious for infection at this point to be peripherally honest. With that being said the patient does seem to be having much more swelling that she is even experienced in the past and coupled with increased pain in her hip as well I am concerned that again she could potentially have a DVT although I am not 100% sure of this. I think it something that may need to be checked out. We discussed the possibility of sending her for a DVT study through the hospital but unfortunately transportation is an issue if she does have a DVT I do not want her to wait days to be able to get in for that test however if she has this scheduled as an outpatient that is as fast that she will be able to get the test scheduled for transportation purposes. That will also fall on Thanksgiving so subsequently she did actually be looking at either Friday or even next week before we would know anything back from this. That is much too long in my opinion. Subsequent to the amount of discomfort she is  experiencing the patient is actually okay with going to the ER for evaluation today. 06/12/2019 on evaluation today patient actually appears to be doing significantly better compared to last time I saw her. Following when I last saw her she was actually in the hospital from that Monday until the following Sunday almost 1 full week. She actually was placed on Keflex in the hospital following the time for her to be discharged and Dr. Steva Ready has recommended 2 times a day dosing of the Keflex for the next  year in order to help with more prophylactic/preventative measures with regard to her developing cellulitis. Overall I think this sounds like an excellent plan. The patient unfortunately is good to have trouble being treated at lymphedema clinic due to the fact that she really cannot get up on the bed that they have there. They also state that they cannot manage her as long as she has anything draining at this point. Obviously that is somewhat unfortunate as she does need help with edema control but nonetheless we will have to do what we can for her outside of it sounds like the lymphedema clinic scenario at this point. 06/19/2019 on evaluation today patient appears to be doing fairly well with regard to her bilateral lower extremities. She is not nearly as swollen [name], [name] ([id number]) and shows no signs of infection at this point. There is no evidence of cellulitis whatsoever. She also has no open wounds or draining at this point which is also good news. No fever chills noted. She seems to be in very good spirits and in fact appears to be doing quite well. READMISSION 11/27/2019 This is a 66 year old woman that we have had in this clinic several times before including 2015, 16 and 19 and then most recently from 03/20/2019 through 06/19/2019 with bilateral lower extremity lymphedema. She has had previous arterial and reflux studies done years ago which were not all that remarkable. 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 She does have a positive family history and she had large legs starting may be in her 80s. She was recently in hospital from 10/20/2019 through 10/28/2019 with right leg cellulitis. She was given Ancef and clindamycin and then Zosyn when a culture showed Pseudomonas. At that time there was purulent drainage. She was followed by infectious disease Dr Steva Ready. The patient is now back at home. She has noted increased  swelling in the right and no drainage in her right leg mostly on the posterior medial aspect in the calf area. She has not had pain or fever. She has literally been improved lysing above dressings because her at the area of this is far too large for standard compression. She has been wrapping the areas with sheets to resorptive pads. She is found these helped somewhat. She does have an appointment with the lymphedema clinic in [place] in late June. Past medical history includes bilateral lymphedema, hypertension, obstructive sleep apnea with CPAP. Recent hospitalization with apparently Pseudomonas cellulitis of the right lower leg 12/15/2019 upon evaluation today patient appears to be doing a little bit worse in regard to her right lower extremity. Unfortunately she is having more weeping down in the lower portion of her leg. Fortunately there is no signs of active infection at this time. No fever chills noted. The patient states she is not having increased pain except for when she attempted to use the lymphedema pumps unfortunately she states that she did have pain  when she did this. Otherwise we been using absorptive dressings of one type or another she is using diapers at home and then subsequently Ace wraps. In regard to the barrier cream we have discussed the possibility of derma cloud which she would like to try I do not have a problem with that. 12/22/2019 upon evaluation today patient actually appears to be doing better in regard to her leg ulcers at this point. Fortunately there does not appear to be any signs of active infection which is great news and I am extremely pleased with where things are progressing at this time. There is no sign of active infection currently. The patient is very pleased to see things doing so well. 12/29/2019 upon evaluation today patient appears to be doing a little bit better in regard to her weeping in general over her lower extremities. She does have some  signs of mild erythema little bit more than what I noted last week or rather last visit. Nonetheless I think that my threshold for switching her antibiotics from Keflex to something else is very low at this point considering that she has had such severe infections in the past that seem to come almost out of nowhere. There is a little erythema and warmth noted of the lower portion of her leg compared to the upper which also makes me want to go ahead and address things more rapidly at this point. Likely I would switch out the Keflex for something like Levaquin ideally. 7/16; patient with severe bilateral lymphedema. She has superficial wounds albeit almost circumferential now on the left lateral lower leg. This may be new from last time. Small area on the right anterior lower leg and then another area on the right medial lower leg and of pannus fold. She has been using various absorptive garments. She states she is using her compression pumps once a day occasionally twice. Culture from her last visit here was negative 01/29/2020 on evaluation today patient appears to be doing excellent at this point in regard to her legs with regard to infection I see no signs of active infection at this point. She still does have unfortunately areas of weeping this is minimal on the right now her left is actually significantly worse although I do not think it is as bad as last week with Dr. Dellia Nims saw her. She has been trying to pump and elevate her legs is much as possible. She has previously been on the Keflex and in the past for prevention that seems to do fairly well and likely can extend that today. 02/04/2020 on evaluation today patient appears to be doing better in regard to her legs bilaterally. Fortunately there is no signs of active infection at this time which is great news and overall she has less weeping on the left compared to the right and there is several spots where she is pretty much sealed up with no  draining regions. Overall very pleased in this regard. 02/19/2020 on evaluation today patient appears to be doing very well in regard to her wounds currently. Fortunately there is no evidence of active infection overall very pleased with where things stand. She is significantly improved in regard to her edema I am extremely pleased in this regard she tells me that the popping no longer hurts and in fact she actually looks forward to it. 03/04/2020 on evaluation today patient appears to be doing excellent in regard to her lower extremities. Fortunately there is no signs of active infection at this  time. No fevers, chills, nausea, vomiting, or diarrhea. 03/25/2020 on evaluation today patient appears to be doing a little bit more poorly in regard to her legs at this point. She tells me that she is still continue to have issues with drainage and this has been a little bit worse she was getting ready to start taking the Keflex again but wanted to see me first. Fortunately there is no signs of active infection at this time. No fevers, chills, nausea, vomiting, or diarrhea. 04/15/2020 upon evaluation today patient appears to be doing somewhat poorly in regard to her right leg. She tells me she has been having more pain she has been taking the Keflex that was previously prescribed unfortunately that just does not seem to help with this. She was hoping that the pain on her right was actually coming from the fact that she was having issues with her wrap having gotten caught in her recliner. With that being said she tells me that she knew something was not right. Currently her right leg is warm to touch along with being erythematous all the way up to around at least mid thigh as far as I can see. The left leg does not appear to be doing that badly though there is increased weeping around the ankle region. 05/06/2020 on evaluation today patient appears to be doing much better than last time I saw her. She did go to  the hospital where she was admitted for 2 days and treated with antibiotic therapy. She was discharged with antibiotics as well and has done extremely well. I am extremely pleased with where things stand today. There is no signs of active infection at this time which is great news. 05/27/2020 upon evaluation today patient appears to be doing well with regard to her lower extremities bilaterally. She has just a very tiny area on the right leg which is opening on the left leg she is significantly improved though she still has several areas that do appear to be open this is minimal compared to what is been in the past. In general I am extremely pleased with where things stand today. The patient does tell me she is not been using her pumps quite as much as she should be. I do believe that is 1 area she can definitely work on. She has had a lot going on including a Covid exposure and apparently also a outbreak of likely shingles. [name], [name] ([id number]) 06/24/2020 upon evaluation today patient appears to be doing well with regard to her legs in general although the left leg unfortunately is showing some signs of erythema she does have a little bit of increased weeping and to be honest I am concerned about infection here. I discussed that with her today and I think that we may need to address this sooner rather than later she has been taking Keflex she is not really certain that is been making a big improvement however. No fevers, chills, nausea, vomiting, or diarrhea. 06/30/2020 upon evaluation today patient's legs actually seem to be doing better in my opinion as compared to where they were last week. Fortunately there does not appear to be any signs of active infection. Her culture showed multiple organisms nothing predominate. With that being said the Levaquin seems to have done well I think she has improved since I last saw her as well. 07/14/2020 upon evaluation today patient appears to be doing  actually better in regard to her lower extremities in my opinion. She has  been tolerating the dressing changes without complication. Fortunately there is no signs of active infection at this time. 08/04/2020 upon evaluation today patient appears to be doing excellent in regard to her leg ulcers. Fortunately she has very little that is open at this point. This is great news. In fact I think that the [employer] medicated powder has been excellent for her. It seems to have done the trick where we had tried several other things without as much success. Fortunately there is no evidence of active infection at this time. No fevers, chills, nausea, vomiting, or diarrhea. 09/01/2020 upon evaluation today patient appears to be doing a little bit more poorly than the last time I saw her. She tells me right now that she has been having a lot of drainage compared to where things were previous which has unfortunately led to more irritation as well. She is concerned that this is leading to infection. Again based on what I am seeing today as well I am also concerned of the same to be honest. 09/15/2020 upon evaluation today patient appears to be doing well with regard to her legs compared to what I saw previous. Fortunately there does not appear to be any evidence of active infection at this time which is great news. At least not as badly as what it was previous. With that being said I do believe that the patient does need to have an extension of the antibiotics. This I believe will actually help her more in the way of making sure this stays under control and does not worsen. That was the Bactrim DS. Readmission: 08/01/2021 upon evaluation today patient appears to be doing actually pretty well all things considered. Is actually been almost a year since have seen her last March. Fortunately I do not see any evidence of active infection locally nor systemically at this point. She does have a couple open areas on the left  leg the right leg appears to be doing quite well. In general she has been in the hospital 3 times since I saw her a year ago all 4 cellulitis type issues except for the last 1 which was actually more related to congestive heart failure for that reason she is not using her lymphedema pumps at this point and that is probably the best idea. 08/08/2021 upon evaluation today patient appears to be doing well with regard to her legs all things considered her left leg may be a little bit more swollen based on what I see today. Fortunately I do not see any signs of active infection locally nor systemically at this time which is great news. No fevers, chills, nausea, vomiting, or diarrhea. 08/15/2021 upon evaluation today patient actually appears to be doing excellent in regard to her wounds. She has been tolerating the dressing changes without complication. Fortunately I see no evidence of infection currently which is great news. No fevers, chills, nausea, vomiting, or diarrhea. 08/29/2021 upon evaluation patient appears to be doing excellent she in fact is almost completely healed. I am actually very pleased with where we stand today and I think that she is making wonderful progress she just has a small area of weeping on the right lateral leg everything else is pretty much completely healed which is also. 09/05/2021 upon evaluation today patient appears to be doing well with regard to her legs. She does have a small area of weeping on the right leg and there is a small area on the left leg as well. It is potentially  something that may need to be addressed in the future more specifically but right now I think that there may have just been a little drainage here on the left. With all that being said I think that this still does not appear to be infected and overall I feel like she is doing quite well. That something we always have to keep a very close eye on as she can change very rapidly from okay to not  okay. 09/12/2021 upon evaluation today patient appears to be doing a little bit worse in regard to her leg and actually somewhat concerned about the possibility of infection here. I discussed that with the patient. For that reason I am going to go ahead and see about getting her set up for a repeat prescription for the Bactrim. She is in agreement with that plan. 3/14; patient presents for follow-up. She has been using silver alginate with dressing changes. She is still taking Bactrim prescribed at last clinic visit. She has no issues or complaints today. She has lymphedema pumps however does not use these. 09/26/2020 upon evaluation today patient appears to actually be doing well in regard to her right leg I am pleased in that regard. Unfortunately she has new areas on her left leg that are open at this point that I do need to be addressed. I am also concerned about the warmth around one of the wounds on the more anterior side. I think this is something that we may need to culture today potentially requiring antibiotics going forward. 10/03/2021 upon evaluation today patient unfortunately is not doing nearly as well as she was even last week with her wounds. She is having some issues here with increased cellulitis of the left lower extremity unfortunately. I did prescribe Augmentin for her eczema prescribed last week unfortunately she never actually received this prescription. The pharmacy stated they never got it. We double checked with them today they still did not receive it. I am not sure what happened in that regard. With that being said the patient is in good spirits today she does not appear to be as sick as where she normally is when the cellulitis gets significantly worse as it has in the past. Nonetheless the leg is much more red not just around the ankle where I was seeing at last week on Tuesday but rather now this is going all the way up to almost her knee and is definitely hot to touch  compared to the right leg. Nonetheless I feel like she does need to go to the ER for likely IV antibiotics. 10-10-2021 upon evaluation today patient appears to be doing well with regard to her wound. She has been tolerating the dressing changes that appears to be doing much better. After I saw her last week she did go to the ER they admitted her to the hospital and gave her IV antibiotics she fortunately is doing significantly better. Overall I am extremely pleased with where things stand today. 10-17-2021 upon evaluation today patient appears to be doing well with regard to her wound. In fact this is looking better and her leg is looking better but she still does have some areas here of erythema. We will continue to address this as soon as possible in my opinion. I do believe she may require some debridement and what appears to be a more defined wound on her left leg at this point. 10-24-2021 upon evaluation patient is definitely showing signs of improvement which is great news. I  do not see any evidence of active infection [name], Lasana. ([id number]) locally or systemically which is great news as well. No fevers, chills, nausea, vomiting, or diarrhea. 10-31-2021. Upon evaluation today patient's leg actually feels better from an infection and wound care perspective. I am actually very pleased in this regard. Unfortunately the biggest issue that I see at this time is that the patient is having a significant issue with her breathing. I did put her on the pulse ox machine to check her oxygen saturation and it was pretty much at 100% which is good but she tells me that when she is walking this is much more difficult for her. She also tells me that when she is trying to bend over to perform her dressing changes she is so short of breath due to the swelling and fluid in her abdominal area that she is just not been able to do this. She is tearful and very concerned today to be honest. I am truly sorry to see  her this way I know she is really having a hard time at the moment. 11-14-2021 upon evaluation patient actually appears to be doing significantly better compared to when I last saw her. She was actually admitted to the hospital I believe this for 6 days total after I sent her on 10-31-2021. Subsequently they actually pulled off greater than 50 pounds of fluid she tells me she feels significantly better her legs are not as tight she actually has some play in the tissue and it is obvious that she is doing much better just from a mobility standpoint as well as a breathing standpoint. Overall I am extremely happy for her and how she is doing the leg also looks much better on the left. 11-21-2021 upon evaluation today patient appears to be doing well although it does appear that she is going require some sharp debridement the wound is very dry this is the opposite of what its been she was having so much weeping that it was staying extremely wet. Nonetheless we do need to see about going ahead and debriding the wound today. 11-28-2021 upon evaluation today patient appears to be doing well with regard to her wound I definitely see signs of things continuing to improve which is great news. I do not see any evidence of active infection locally or systemically also great news. 12-05-2021 upon evaluation today patient appears to be doing well with regard to her wound. She has been tolerating the dressing changes. Fortunately there does not appear to be any signs of active infection locally or systemically at this time. No fevers, chills, nausea, vomiting, or diarrhea. 12-12-2021 upon evaluation patient appears to be doing well with regard to her wound this is actually measuring smaller and looking much better. Fortunately I do not see any signs of active infection locally or systemically at this time which is excellent news. 6/13; small wound area in the folds of her lymphedema just above the left ankle. Fortunately  the area looks quite good. She has been using silver alginate ABDs and an Ace wrap which she is able to change her self. She is not using her compression pumps out of fear that this could contribute to heart failure. 12-26-2021 upon evaluation patient's wounds are actually showing signs of excellent improvement. I am very pleased with where things stand. Overall I think that she has been making excellent progress and I think we are very close to complete resolution which is great news. 01-11-2022 upon evaluation  today patient appears to be doing well currently in regard to her legs in general although on the left leg she does have 1 area that still irritated and inflamed on the anterior portion of her leg. Fortunately I do not see any signs of systemic infection locally there might be some infection going on here however which is my biggest concern. Fortunately I do not see any evidence though of this spreading to any other location. Objective Constitutional Obese and well-hydrated in no acute distress. Vitals Time Taken: 11:32 AM, Height: 63 in, Weight: 372 lbs, BMI: 65.9, Temperature: 97.8 F, Pulse: 64 bpm, Respiratory Rate: 18 breaths/min, Blood Pressure: 111/68 mmHg. Respiratory normal breathing without difficulty. Psychiatric this patient is able to make decisions and demonstrates good insight into disease process. Alert and Oriented x 3. pleasant and cooperative. General Notes: Upon inspection patient's wound bed actually showed signs of good granulation and epithelization at this point. She does have some irritation and breakdown in regard to the left leg more medial and in one of the creases towards the lower portion of her leg. Fortunately I think this is showing signs of improvement overall as far as the depth of the wound although this irritation is spreading. She has been using some nystatin powder which I think is absolutely okay with that being said I think she may need something a  little bit more from the standpoint of an antibiotic to get this under control and try to prevent this from worsening in general. Integumentary (Hair, Skin) Wound #10 status is Open. Original cause of wound was Gradually Appeared. The date acquired was: 10/17/2021. The wound has been in treatment 12 weeks. The wound is located on the Left,Medial,Anterior Lower Leg. The wound measures 4.5cm length x 3.4cm width x 0.1cm depth; 12.017cm^2 area and 1.202cm^3 volume. There is Fat Layer (Subcutaneous Tissue) exposed. There is a medium amount of serosanguineous drainage noted. There is medium (34-66%) red granulation within the wound bed. There is a small (1-33%) amount of necrotic [name], [name] ([id number]) tissue within the wound bed including Adherent Slough. 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 Nurse Visit as needed Bathing/ Shower/ Hygiene: Clean wound with Normal Saline or wound cleanser. Wash wounds with antibacterial soap and water. May shower; gently cleanse wound with antibacterial soap, rinse and pat dry prior to dressing wounds No tub bath. Edema Control - Lymphedema / Segmental Compressive Device / Other: Ace wraps Elevate, Exercise Daily and Avoid Standing for Long Periods of Time. Elevate leg(s) parallel to the floor when sitting. DO YOUR BEST to sleep in the bed at night. DO NOT sleep in your recliner. Long hours of sitting in a recliner leads to swelling of the legs and/or potential wounds on your backside. Other: - use lymphedema pumps Non-Wound Condition: Cleanse affected area with antibacterial soap and water, Apply appropriate compression. - ace wrap Additional non-wound orders/instructions: - silver cell to open  weeping areas with abd pad Medications-Please add to medication list.: P.O. Antibiotics - start on Doxycycline The following medication(s) was prescribed: doxycycline hyclate oral 100 mg capsule 1 1 capsule oral taken 2 times per day for 14 days starting 01/11/2022 WOUND #10: - Lower Leg Wound Laterality: Left, Medial, Anterior Cleanser: Normal Saline 1 x Per Day/30 Days Discharge Instructions: [facility]  your hands with soap and water. Remove old dressing, discard into plastic bag and place into trash. Cleanse the wound with Normal Saline prior to applying a clean dressing using gauze sponges, not tissues or cotton balls. Do not scrub or use excessive force. Pat dry using gauze sponges, not tissue or cotton balls. Cleanser: Soap and Water 1 x Per Day/30 Days Discharge Instructions: Gently cleanse wound with antibacterial soap, rinse and pat dry prior to dressing wounds Primary Dressing: Silvercel 4 1/4x 4 1/4 (in/in) (DME) (Generic) 1 x Per Day/30 Days Discharge Instructions: Apply Silvercel 4 1/4x 4 1/4 (in/in) as instructed Secondary Dressing: ABD Pad 5x9 (in/in) (DME) (Generic) 1 x Per Day/30 Days Discharge Instructions: Cover with ABD pad Secured With: ACE WRAP - 56M ACE Elastic Bandage With VELCRO Brand Closure, 4 (in) 1 x Per Day/30 Days Secured With: Medipore Tape - 56M Medipore H Soft Cloth Surgical Tape, 2x2 (in/yd) (DME) (Generic) 1 x Per Day/30 Days Secured With: Kerlix Roll Sterile or Non-Sterile 6-ply 4.5x4 (yd/yd) (Generic) 1 x Per Day/30 Days Discharge Instructions: Apply Kerlix as directed Secured With: chucks pad for drainage PRN 1 x Per Day/30 Days 1. I would recommend currently that we go ahead and initiate treatment with a prescription for doxycycline which I think is good to be a good option here for her. I am going to send that into the pharmacy so that she can pick that up. 2. I am going to recommend as well that we have the patient continue to monitor for any signs of worsening  or infection. Obviously if anything changes she should let me know but right now my main goal is primarily to make sure that we have any infection under control and keep this otherwise is clean and dry as possible. She is in agreement with this plan. We will see patient back for reevaluation in 1 week here in the clinic. If anything worsens or changes patient will contact our office for additional recommendations. [name], [name] ([id number]) Electronic Signature(s) Signed: 01/11/2022 12:05:21 PM By: Worthy Keeler PA-C Entered By: Worthy Keeler on 01/11/2022 12:05:20 [name] ([id number]) -------------------------------------------------------------------------------- SuperBill Details Patient Name: [name] Date of Service: 01/11/2022 Medical Record Number: [id number] Patient Account Number: [id number] Date of Birth/Sex: [birth year] (66 y.o. F) Treating RN: [name] Dredge Primary Care Provider: Ranae Plumber Other Clinician: Massie Kluver Referring Provider: Ranae Plumber Treating Provider/Extender: Skipper Cliche in Treatment: 23 Diagnosis Coding ICD-10 Codes Code Description Q82.0 Hereditary lymphedema L97.811 Non-pressure chronic ulcer of other part of right lower leg limited to breakdown of skin L97.821 Non-pressure chronic ulcer of other part of left lower leg limited to breakdown of skin Facility Procedures CPT4 Code: 28768115 Description: 99213 - WOUND CARE VISIT-LEV 3 EST PT Modifier: Quantity: 1 Physician Procedures CPT4 Code: 7262035 Description: 59741 - WC PHYS LEVEL 4 - EST PT Modifier: Quantity: 1 CPT4 Code: Description: ICD-10 Diagnosis Description Q82.0 Hereditary lymphedema L97.811 Non-pressure chronic ulcer of other part of right lower leg limited to bre L97.821 Non-pressure chronic ulcer of other part of left lower leg limited to brea Modifier: akdown of skin kdown of skin Quantity: Electronic Signature(s) Signed: 01/11/2022 12:05:38  PM By: Worthy Keeler PA-C Entered By: Worthy Keeler on 01/11/2022 12:05:37

## 2022-01-11 NOTE — Progress Notes (Addendum)
Elizabeth, [name] ([id number]) Visit Report for 01/11/2022 Arrival Information Details Patient Name: [name], [name]. Date of Service: 01/11/2022 11:15 AM Medical Record Number: [id number] Patient Account Number: [id number] Date of Birth/Sex: 03/01/1956 (66 y.o. F) Treating RN: [name] Dredge Primary Care Briah Nary: Ranae Plumber Other Clinician: Massie Kluver Referring Rivaan Kendall: Ranae Plumber Treating Naveah Brave/Extender: Skipper Cliche in Treatment: 23 Visit Information History Since Last Visit All ordered tests and consults were completed: No Patient Arrived: [name] Added or deleted any medications: No Arrival Time: 11:29 Any new allergies or adverse reactions: No Transfer Assistance: None Had a fall or experienced change in No activities of daily living that may affect risk of falls: Hospitalized since last visit: No Pain Present Now: Yes Electronic Signature(s) Signed: 01/11/2022 4:13:59 PM By: Massie Kluver Entered By: Massie Kluver on 01/11/2022 11:30:38 Rebert, [name] ([id number]) -------------------------------------------------------------------------------- Clinic Level of Care Assessment Details Patient Name: [name]. Date of Service: 01/11/2022 11:15 AM Medical Record Number: [id number] Patient Account Number: [id number] Date of Birth/Sex: Dec 07, 1955 (66 y.o. F) Treating RN: [name] Dredge Primary Care Taiyo Kozma: Ranae Plumber Other Clinician: Massie Kluver Referring Adra Shepler: Ranae Plumber Treating Izac Faulkenberry/Extender: Skipper Cliche in Treatment: 23 Clinic Level of Care Assessment Items TOOL 4 Quantity Score '[]'$  - Use when only an EandM is performed on FOLLOW-UP visit 0 ASSESSMENTS - Nursing Assessment / Reassessment X - Reassessment of Co-morbidities (includes updates in patient status) 1 10 X- 1 5 Reassessment of Adherence to Treatment Plan ASSESSMENTS - Wound and Skin Assessment / Reassessment X - Simple Wound Assessment / Reassessment -  one wound 1 5 '[]'$  - 0 Complex Wound Assessment / Reassessment - multiple wounds '[]'$  - 0 Dermatologic / Skin Assessment (not related to wound area) ASSESSMENTS - Focused Assessment X - Circumferential Edema Measurements - multi extremities 1 5 '[]'$  - 0 Nutritional Assessment / Counseling / Intervention '[]'$  - 0 Lower Extremity Assessment (monofilament, tuning fork, pulses) '[]'$  - 0 Peripheral Arterial Disease Assessment (using hand held doppler) ASSESSMENTS - Ostomy and/or Continence Assessment and Care '[]'$  - Incontinence Assessment and Management 0 '[]'$  - 0 Ostomy Care Assessment and Management (repouching, etc.) PROCESS - Coordination of Care X - Simple Patient / Family Education for ongoing care 1 15 '[]'$  - 0 Complex (extensive) Patient / Family Education for ongoing care X- 1 10 Staff obtains Consents, Records, Test Results / Process Orders '[]'$  - 0 Staff telephones HHA, Nursing Homes / Clarify orders / etc '[]'$  - 0 Routine Transfer to another Facility (non-emergent condition) '[]'$  - 0 Routine Hospital Admission (non-emergent condition) '[]'$  - 0 New Admissions / Biomedical engineer / Ordering NPWT, Apligraf, etc. '[]'$  - 0 Emergency Hospital Admission (emergent condition) X- 1 10 Simple Discharge Coordination '[]'$  - 0 Complex (extensive) Discharge Coordination PROCESS - Special Needs '[]'$  - Pediatric / Minor Patient Management 0 '[]'$  - 0 Isolation Patient Management '[]'$  - 0 Hearing / Language / Visual special needs '[]'$  - 0 Assessment of Community assistance (transportation, D/C planning, etc.) '[]'$  - 0 Additional assistance / Altered mentation '[]'$  - 0 Support Surface(s) Assessment (bed, cushion, seat, etc.) INTERVENTIONS - Wound Cleansing / Measurement Woodroof, Magdeline C. ([id number]) X- 1 5 Simple Wound Cleansing - one wound '[]'$  - 0 Complex Wound Cleansing - multiple wounds X- 1 5 Wound Imaging (photographs - any number of wounds) '[]'$  - 0 Wound Tracing (instead of photographs) X- 1  5 Simple Wound Measurement - one wound '[]'$  - 0 Complex Wound Measurement - multiple wounds INTERVENTIONS -  Wound Dressings '[]'$  - Small Wound Dressing one or multiple wounds 0 '[]'$  - 0 Medium Wound Dressing one or multiple wounds X- 1 20 Large Wound Dressing one or multiple wounds '[]'$  - 0 Application of Medications - topical '[]'$  - 0 Application of Medications - injection INTERVENTIONS - Miscellaneous '[]'$  - External ear exam 0 '[]'$  - 0 Specimen Collection (cultures, biopsies, blood, body fluids, etc.) '[]'$  - 0 Specimen(s) / Culture(s) sent or taken to Lab for analysis '[]'$  - 0 Patient Transfer (multiple staff / Harrel Lemon Lift / Similar devices) '[]'$  - 0 Simple Staple / Suture removal (25 or less) '[]'$  - 0 Complex Staple / Suture removal (26 or more) '[]'$  - 0 Hypo / Hyperglycemic Management (close monitor of Blood Glucose) '[]'$  - 0 Ankle / Brachial Index (ABI) - do not check if billed separately X- 1 5 Vital Signs Has the patient been seen at the hospital within the last three years: Yes Total Score: 100 Level Of Care: New/Established - Level 3 Electronic Signature(s) Signed: 01/11/2022 4:13:59 PM By: Massie Kluver Entered By: Massie Kluver on 01/11/2022 12:04:07 Eisenberg, [name] ([id number]) -------------------------------------------------------------------------------- Encounter Discharge Information Details Patient Name: [name]. Date of Service: 01/11/2022 11:15 AM Medical Record Number: [id number] Patient Account Number: [id number] Date of Birth/Sex: 05/05/1956 (66 y.o. F) Treating RN: [name] Dredge Primary Care Anna Livers: Ranae Plumber Other Clinician: Massie Kluver Referring Taila Basinski: Ranae Plumber Treating Jamaya Sleeth/Extender: Skipper Cliche in Treatment: 23 Encounter Discharge Information Items Discharge Condition: Stable Ambulatory Status: Cane Discharge Destination: Home Transportation: Private Auto Accompanied By: self Schedule Follow-up Appointment:  Yes Clinical Summary of Care: Electronic Signature(s) Signed: 01/11/2022 4:13:59 PM By: Massie Kluver Entered By: Massie Kluver on 01/11/2022 12:23:06 Guisinger, [name] ([id number]) -------------------------------------------------------------------------------- Lower Extremity Assessment Details Patient Name: [name] Date of Service: 01/11/2022 11:15 AM Medical Record Number: [id number] Patient Account Number: [id number] Date of Birth/Sex: [birth year] (66 y.o. F) Treating RN: [name] Dredge Primary Care Kymiah Araiza: Ranae Plumber Other Clinician: Massie Kluver Referring Shya Kovatch: Ranae Plumber Treating Viet Kemmerer/Extender: Jeri Cos Weeks in Treatment: 23 Edema Assessment Assessed: [Left: Yes] [Right: Yes] Edema: [Left: Yes] [Right: Yes] Calf Left: Right: Point of Measurement: 39 cm From Medial Instep 90 cm 90.5 cm Ankle Left: Right: Point of Measurement: 9 cm From Medial Instep 46 cm 53.5 cm Vascular Assessment Pulses: Dorsalis Pedis Palpable: [Left:Yes] [Right:Yes] Electronic Signature(s) Signed: 01/11/2022 4:13:59 PM By: Massie Kluver Signed: 01/11/2022 4:47:59 PM By: [name] Dredge Entered By: Massie Kluver on 01/11/2022 11:49:16 [place], [name] ([id number]) -------------------------------------------------------------------------------- Multi Wound Chart Details Patient Name: [name] Date of Service: 01/11/2022 11:15 AM Medical Record Number: [id number] Patient Account Number: [id number] Date of Birth/Sex: 1955-08-18 (66 y.o. F) Treating RN: [name] Dredge Primary Care Montel Vanderhoof: Ranae Plumber Other Clinician: Massie Kluver Referring Gailene Youkhana: Ranae Plumber Treating Mikiala Fugett/Extender: Skipper Cliche in Treatment: 23 Vital Signs Height(in): 32 Pulse(bpm): 34 Weight(lbs): 962 Blood Pressure(mmHg): 111/68 Body Mass Index(BMI): 65.9 Temperature(F): 97.8 Respiratory Rate(breaths/min): 18 Photos: [N/A:N/A] Wound Location: Left, Medial,  Anterior Lower Leg N/A N/A Wounding Event: Gradually Appeared N/A N/A Primary Etiology: Lymphedema N/A N/A Comorbid History: Cataracts, Chronic sinus N/A N/A problems/congestion, Anemia, Lymphedema, Asthma, Sleep Apnea, Tuberculosis, Angina, Hypertension, Gout, Osteoarthritis Date Acquired: 10/17/2021 N/A N/A Weeks of Treatment: 12 N/A N/A Wound Status: Open N/A N/A Wound Recurrence: No N/A N/A Measurements L x W x D (cm) 4.5x3.4x0.1 N/A N/A Area (cm) : 12.017 N/A N/A Volume (cm) : 1.202 N/A N/A % Reduction in Area: -53.00%  N/A N/A % Reduction in Volume: -53.10% N/A N/A Classification: Full Thickness Without Exposed N/A N/A Support Structures Exudate Amount: Medium N/A N/A Exudate Type: Serosanguineous N/A N/A Exudate Color: red, brown N/A N/A Granulation Amount: Medium (34-66%) N/A N/A Granulation Quality: Red N/A N/A Necrotic Amount: Small (1-33%) N/A N/A Exposed Structures: Fat Layer (Subcutaneous Tissue): N/A N/A Yes Epithelialization: Medium (34-66%) N/A N/A Treatment Notes Electronic Signature(s) Signed: 01/11/2022 4:13:59 PM By: Massie Kluver Entered By: Massie Kluver on 01/11/2022 11:49:27 Dicaprio, [name] ([id number]) -------------------------------------------------------------------------------- Multi-Disciplinary Care Plan Details Patient Name: [name]. Date of Service: 01/11/2022 11:15 AM Medical Record Number: [id number] Patient Account Number: [id number] Date of Birth/Sex: 1955-11-19 (66 y.o. F) Treating RN: [name] Dredge Primary Care Marene Gilliam: Ranae Plumber Other Clinician: Massie Kluver Referring Oree Hislop: Ranae Plumber Treating Mechele Kittleson/Extender: Skipper Cliche in Treatment: 23 Active Inactive Wound/Skin Impairment Nursing Diagnoses: Impaired tissue integrity Knowledge deficit related to ulceration/compromised skin integrity Goals: Ulcer/skin breakdown will have a volume reduction of 30% by week 4 Date Initiated: 08/01/2021 Target  Resolution Date: 08/29/2021 Goal Status: Active Ulcer/skin breakdown will have a volume reduction of 50% by week 8 Date Initiated: 08/01/2021 Target Resolution Date: 09/26/2021 Goal Status: Active Ulcer/skin breakdown will have a volume reduction of 80% by week 12 Date Initiated: 08/01/2021 Target Resolution Date: 10/24/2021 Goal Status: Active Ulcer/skin breakdown will heal within 14 weeks Date Initiated: 08/01/2021 Target Resolution Date: 11/07/2021 Goal Status: Active Interventions: Assess patient/caregiver ability to obtain necessary supplies Assess patient/caregiver ability to perform ulcer/skin care regimen upon admission and as needed Assess ulceration(s) every visit Provide education on ulcer and skin care Notes: Electronic Signature(s) Signed: 01/11/2022 4:13:59 PM By: Massie Kluver Signed: 01/11/2022 4:47:59 PM By: [name] Dredge Entered By: Massie Kluver on 01/11/2022 11:49:20 [place], [name] ([id number]) -------------------------------------------------------------------------------- Pain Assessment Details Patient Name: [name]. Date of Service: 01/11/2022 11:15 AM Medical Record Number: [id number] Patient Account Number: [id number] Date of Birth/Sex: 08/24/1955 (66 y.o. F) Treating RN: [name] Dredge Primary Care Kismet Facemire: Ranae Plumber Other Clinician: Massie Kluver Referring Tamakia Porto: Ranae Plumber Treating Matia Zelada/Extender: Skipper Cliche in Treatment: 23 Active Problems Location of Pain Severity and Description of Pain Patient Has Paino Yes Site Locations Pain Location: Pain in Ulcers Duration of the Pain. Constant / Intermittento Intermittent Rate the pain. Current Pain Level: 1 Character of Pain Describe the Pain: Aching Pain Management and Medication Current Pain Management: Medication: Yes Rest: Yes Notes nagging ache comes and goes Electronic Signature(s) Signed: 01/11/2022 4:13:59 PM By: Massie Kluver Signed: 01/11/2022 4:47:59 PM  By: [name] Dredge Entered By: Massie Kluver on 01/11/2022 11:45:52 Strathman, [name] ([id number]) -------------------------------------------------------------------------------- Patient/Caregiver Education Details Patient Name: [name]. Date of Service: 01/11/2022 11:15 AM Medical Record Number: [id number] Patient Account Number: [id number] Date of Birth/Gender: [birth year] (66 y.o. F) Treating RN: [name] Dredge Primary Care Physician: Ranae Plumber Other Clinician: Massie Kluver Referring Physician: Ranae Plumber Treating Physician/Extender: Skipper Cliche in Treatment: 23 Education Assessment Education Provided To: Patient Education Topics Provided Wound/Skin Impairment: Handouts: Other: continue wound care as directed Methods: Explain/Verbal Responses: State content correctly Electronic Signature(s) Signed: 01/11/2022 4:13:59 PM By: Massie Kluver Entered By: Massie Kluver on 01/11/2022 12:04:34 Moren, [name] ([id number]) -------------------------------------------------------------------------------- Wound Assessment Details Patient Name: [name] Date of Service: 01/11/2022 11:15 AM Medical Record Number: [id number] Patient Account Number: [id number] Date of Birth/Sex: May 22, 1956 (66 y.o. F) Treating RN: [name] Dredge Primary Care Paidyn Mcferran: Ranae Plumber Other Clinician: Massie Kluver Referring Ladarion Munyon: Daryel Gerald ,  Linda Treating Samariah Hokenson/Extender: Jeri Cos Weeks in Treatment: 23 Wound Status Wound Number: 10 Primary Lymphedema Etiology: Wound Location: Left, Medial, Anterior Lower Leg Wound Open Wounding Event: Gradually Appeared Status: Date Acquired: 10/17/2021 Comorbid Cataracts, Chronic sinus problems/congestion, Anemia, Weeks Of Treatment: 12 History: Lymphedema, Asthma, Sleep Apnea, Tuberculosis, Angina, Clustered Wound: No Hypertension, Gout, Osteoarthritis Photos Wound Measurements Length: (cm) 4.5 Width: (cm)  3.4 Depth: (cm) 0.1 Area: (cm) 12.017 Volume: (cm) 1.202 % Reduction in Area: -53% % Reduction in Volume: -53.1% Epithelialization: Medium (34-66%) Wound Description Classification: Full Thickness Without Exposed Support Structures Exudate Amount: Medium Exudate Type: Serosanguineous Exudate Color: red, brown Foul Odor After Cleansing: No Slough/Fibrino Yes Wound Bed Granulation Amount: Medium (34-66%) Exposed Structure Granulation Quality: Red Fat Layer (Subcutaneous Tissue) Exposed: Yes Necrotic Amount: Small (1-33%) Necrotic Quality: Adherent Slough Treatment Notes Wound #10 (Lower Leg) Wound Laterality: Left, Medial, Anterior Cleanser Normal Saline Discharge Instruction: Wash your hands with soap and water. Remove old dressing, discard into plastic bag and place into trash. Cleanse the wound with Normal Saline prior to applying a clean dressing using gauze sponges, not tissues or cotton balls. Do not scrub or use excessive force. Pat dry using gauze sponges, not tissue or cotton balls. Soap and Water Discharge Instruction: Gently cleanse wound with antibacterial soap, rinse and pat dry prior to dressing wounds SYMONE, CORNMAN. ([id number]) Peri-Wound Care Topical Primary Dressing Silvercel 4 1/4x 4 1/4 (in/in) Discharge Instruction: Apply Silvercel 4 1/4x 4 1/4 (in/in) as instructed Secondary Dressing ABD Pad 5x9 (in/in) Discharge Instruction: Cover with ABD pad Secured With ACE WRAP - 76M ACE Elastic Bandage With VELCRO Brand Closure, 4 (in) Medipore Tape - 76M Medipore H Soft Cloth Surgical Tape, 2x2 (in/yd) Kerlix Roll Sterile or Non-Sterile 6-ply 4.5x4 (yd/yd) Discharge Instruction: Apply Kerlix as directed chucks pad for drainage PRN Compression Wrap Compression Stockings Add-Ons Electronic Signature(s) Signed: 01/11/2022 4:13:59 PM By: Massie Kluver Signed: 01/11/2022 4:47:59 PM By: [name] Dredge Entered By: Massie Kluver on 01/11/2022 11:46:31 [place],  [name] ([id number]) -------------------------------------------------------------------------------- [place] Details Patient Name: [name]. Date of Service: 01/11/2022 11:15 AM Medical Record Number: [id number] Patient Account Number: [id number] Date of Birth/Sex: 1956/01/09 (66 y.o. F) Treating RN: [name] Dredge Primary Care Markise Haymer: Ranae Plumber Other Clinician: Massie Kluver Referring Shyanne Mcclary: Ranae Plumber Treating Ether Wolters/Extender: Skipper Cliche in Treatment: 23 Vital Signs Time Taken: 11:32 Temperature (F): 97.8 Height (in): 63 Pulse (bpm): 64 Weight (lbs): 372 Respiratory Rate (breaths/min): 18 Body Mass Index (BMI): 65.9 Blood Pressure (mmHg): 111/68 Reference Range: 80 - 120 mg / dl Electronic Signature(s) Signed: 01/11/2022 4:13:59 PM By: Massie Kluver Entered By: Massie Kluver on 01/11/2022 11:36:08

## 2022-01-11 NOTE — Progress Notes (Addendum)
Patient ID: [name], female    DOB: March 07, 1956, 66 y.o.   MRN: [id number]  HPI  [name] is a 66 y/o female with a history of asthma, HTN, arthritis, lymphedema, sleep apnea, spinal stenosis, obesity, previous tobacco use and chronic heart failure.   Echo report from 11/01/21 reviewed and showed an EF of 60-65%.   Admitted 10/31/21 due to worsening shortness of breath, edema and orthopnea. Initially given IV lasix with transition to oral diuretics with addition of twice weekly metolazone. Cardiology consult obtained. Discharged after 6 days.   She presents today for a follow-up visit with a chief complaint of moderate shortness of breath with moderate exertion. She describes this as chronic in nature although she does feel like it has improved since last visit here. She has associated fatigue, chest tightness, cough, pedal edema (improving), anxiety and difficulty sleeping along with this.   Has used 2 doses of SQ furoscix with good results. Currently taking '20mg'$  furosemide daily with additional '20mg'$  if needed along with metolazone twice week.   She has been trying to weigh herself using 2 scales as her lymphedema is so severe that she is unable to stand on one small digital scale at home. She currently has 1 foot on 1 scale and another foot on the other scale at home but says that the weight fluctuates 10-15 pounds. She confirms that the scales are on a flat surface.   Past Medical History:  Diagnosis Date   Arthritis    Asthma    CHF (congestive heart failure) ([place])    Enlarged heart    Hypertension    Lymphedema    Sleep apnea    Spinal stenosis    Past Surgical History:  Procedure Laterality Date   COLONOSCOPY WITH PROPOFOL N/A 03/12/2018   Procedure: COLONOSCOPY WITH PROPOFOL;  Surgeon: Manya Silvas, MD;  Location: [facility] ENDOSCOPY;  Service: Endoscopy;  Laterality: N/A;   NO PAST SURGERIES     Family History  Problem Relation Age of Onset   Thyroid disease  Other    Breast cancer Maternal Aunt    Social History   Tobacco Use   Smoking status: Former    Packs/day: 0.25    Types: Cigarettes    Start date: 07/09/1976    Quit date: 07/09/1990    Years since quitting: 31.5   Smokeless tobacco: Current    Types: Snuff  Substance Use Topics   Alcohol use: No   Allergies  Allergen Reactions   Ace Inhibitors Hives and Swelling   Ibuprofen Hives and Swelling   Vancomycin Itching and Nausea And Vomiting   Prior to Admission medications   Medication Sig Start Date End Date Taking? Authorizing Provider  acetaminophen (TYLENOL) 500 MG tablet Take 500-1,000 mg by mouth every 6 (six) hours as needed for mild pain or fever.   Yes [provider]  atenolol (TENORMIN) 25 MG tablet Take 1 tablet (25 mg total) by mouth daily. 11/06/21 02/04/22 Yes Enzo Bi, MD  cetirizine (ZYRTEC) 10 MG tablet Take 1 tablet (10 mg total) by mouth daily. 12/25/20  Yes Lavina Hamman, MD  furosemide (LASIX) 40 MG tablet Take 0.5 tablets (20 mg total) by mouth daily. Patient taking differently: Take 20 mg by mouth daily. Some days takes '40mg'$  for weight gain 11/23/21 02/21/22 Yes Trexton Escamilla, Otila Kluver A, FNP  metolazone (ZAROXOLYN) 2.5 MG tablet Take 1 tab every Tue and Sat 30 minutes prior Lasix. 11/06/21  Yes Enzo Bi, MD  potassium chloride SA (KLOR-CON M) 20 MEQ tablet Take 2 tablets (40 mEq total) by mouth daily. 11/28/21  Yes Aleksis Jiggetts A, FNP  senna-docusate (SENOKOT-S) 8.6-50 MG tablet Take 1 tablet by mouth 2 (two) times daily.   Yes [provider]   Review of Systems  Constitutional:  Positive for fatigue. Negative for appetite change.  HENT:  Positive for postnasal drip. Negative for congestion and sore throat.   Eyes: Negative.   Respiratory:  Positive for cough (dry), chest tightness ("discomfort at times") and shortness of breath (with moderate exertion).   Cardiovascular:  Positive for leg swelling (worsening). Negative for chest pain and palpitations.   Gastrointestinal:  Negative for abdominal distention, abdominal pain, constipation and diarrhea.  Endocrine: Negative.   Genitourinary: Negative.   Musculoskeletal:  Negative for back pain and neck pain.  Skin: Negative.        Severe lymphedema  Allergic/Immunologic: Negative.   Neurological:  Negative for dizziness and light-headedness.  Hematological:  Negative for adenopathy. Does not bruise/bleed easily.  Psychiatric/Behavioral:  Positive for sleep disturbance (chronic trouble falling asleep; sleeps in recliner). Negative for dysphoric mood. The patient is nervous/anxious.    Vitals:   01/11/22 1253  BP: (!) 115/53  Pulse: 83  Resp: 18  SpO2: 100%  Weight: (!) 390 lb (176.9 kg)  Height: '5\' 3"'$  (1.6 m)   Wt Readings from Last 3 Encounters:  01/11/22 (!) 390 lb (176.9 kg)  01/03/22 (!) 390 lb (176.9 kg)  11/22/21 (!) 375 lb 6 oz (170.3 kg)   Lab Results  Component Value Date   CREATININE 1.69 (H) 01/03/2022   CREATININE 1.46 (H) 12/12/2021   CREATININE 1.54 (H) 11/28/2021   Physical Exam Vitals and nursing note reviewed.  Constitutional:      Appearance: She is well-developed.  HENT:     Head: Normocephalic and atraumatic.  Cardiovascular:     Rate and Rhythm: Normal rate and regular rhythm.  Pulmonary:     Effort: Pulmonary effort is normal. No accessory muscle usage.     Breath sounds: No wheezing, rhonchi or rales.  Abdominal:     Palpations: Abdomen is soft.     Tenderness: There is no abdominal tenderness.  Musculoskeletal:     Cervical back: Normal range of motion and neck supple.     Right lower leg: Edema present.     Left lower leg: Edema present.     Comments: Severe lymphedema  Skin:    General: Skin is warm and dry.  Neurological:     General: No focal deficit present.     Mental Status: She is alert and oriented to person, place, and time.  Psychiatric:        Mood and Affect: Mood is not depressed.        Behavior: Behavior normal.    Assessment & Plan:  1: Acute on Chronic heart failure with preserved ejection fraction without structural changes- - NYHA class III - euvolemic today - attempting to weigh daily with standing on 2 scales but it's been fluctuating quite a bit; she's going to look into finding a wide enough scale for her to stand on - weight unchanged from last visit here 1 week ago - has used 2 doses of furoscix since last here and feels like her symptoms have improved slightly - will check BMP today & call patient to advise if she can use furoscix again - will send in furoscix order if labs look good - nephrology referral  had been made - will do alternating furosemide dose with '40mg'$  QOD / '20mg'$  QOD - limiting her sodium intake but occasionally adds "a pinch" of salt to meats; does not like [name], garlic powder etc - understands to keep daily fluid intake to 60-64 ounces/ day - facial/mouth swelling with ACEi - saw cardiology Margarito Courser) 11/15/21 - BNP 10/31/21 was 123.9  2: HTN- - BP looks good (115/53) - sees PCP Daryel Gerald) @ [facility] and she has to call and schedule an appointment - BMP 01/03/22 reviewed and showed sodium 139, potassium 4.1, creatinine 1.69 and GFR 33  3: Lymphedema- - went to wound center earlier today - checking BMP today  - she feels like lymphedema may be a little better; will be starting a course of doxycycline  4: Sleep apnea- - wearing CPAP nightly   Medication bottles reviewed.   Return in 2 weeks, sooner if needed

## 2022-01-16 ENCOUNTER — Encounter: Payer: Medicare HMO | Admitting: Physician Assistant

## 2022-01-16 DIAGNOSIS — L97811 Non-pressure chronic ulcer of other part of right lower leg limited to breakdown of skin: Secondary | ICD-10-CM | POA: Diagnosis not present

## 2022-01-16 NOTE — Progress Notes (Signed)
[name], [name] ([id number]) Visit Report for 01/16/2022 Chief Complaint Document Details Patient Name: [name], [name]. Date of Service: 01/16/2022 10:30 AM Medical Record Number: [id number] Patient Account Number: [id number] Date of Birth/Sex: 06-14-1956 (66 y.o. F) Treating RN: Cornell Barman Primary Care Provider: Ranae Plumber Other Clinician: Referring Provider: Ranae Plumber Treating Provider/Extender: Skipper Cliche in Treatment: 24 Information Obtained from: Patient Chief Complaint Bilateral LE lymphedema with Left LE Ulcers Electronic Signature(s) Signed: 01/16/2022 10:40:46 AM By: Worthy Keeler PA-C Entered By: Worthy Keeler on 01/16/2022 10:40:45 [name], [name] ([id number]) -------------------------------------------------------------------------------- Problem List Details Patient Name: [name] Date of Service: 01/16/2022 10:30 AM Medical Record Number: [id number] Patient Account Number: [id number] Date of Birth/Sex: 1956/04/12 (66 y.o. 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

## 2022-01-16 NOTE — Progress Notes (Signed)
[name], [name] ([id number]) Visit Report for 01/16/2022 Arrival Information Details Patient Name: [name]. Date of Service: 01/16/2022 10:30 AM Medical Record Number: [id number] Patient Account Number: [id number] Date of Birth/Sex: Aug 30, 1955 (66 y.o. F) Treating RN: Cornell Barman Primary Care Jayla Mackie: Ranae Plumber Other Clinician: Referring Arzell Mcgeehan: Ranae Plumber Treating Graci Hulce/Extender: Skipper Cliche in Treatment: 24 Visit Information History Since Last Visit Added or deleted any medications: No Patient Arrived: Cane Has Dressing in Place as Prescribed: Yes Arrival Time: 10:38 Pain Present Now: No Accompanied By: self Transfer Assistance: None Patient Identification Verified: Yes Secondary Verification Process Completed: Yes Patient Has Alerts: Yes Patient Alerts: Borderline Diabetic Electronic Signature(s) Signed: 01/16/2022 3:46:53 PM By: Gretta Cool, BSN, RN, CWS, Kim RN, BSN Entered By: Gretta Cool, BSN, RN, CWS, Kim on 01/16/2022 10:39:03 [name] ([id number]) -------------------------------------------------------------------------------- Clinic Level of Care Assessment Details Patient Name: [name]. Date of Service: 01/16/2022 10:30 AM Medical Record Number: [id number] Patient Account Number: [id number] Date of Birth/Sex: 02-03-1956 (66 y.o. F) Treating RN: Cornell Barman Primary Care Isabellah Sobocinski: Ranae Plumber Other Clinician: Referring Elbert Spickler: Ranae Plumber Treating Mckenzye Cutright/Extender: Skipper Cliche in Treatment: 24 Clinic Level of Care Assessment Items TOOL 4 Quantity Score '[]'$  - Use when only an EandM is performed on FOLLOW-UP visit 0 ASSESSMENTS - Nursing Assessment / Reassessment X - Reassessment of Co-morbidities (includes updates in patient status) 1 10 X- 1 5 Reassessment of Adherence to Treatment Plan ASSESSMENTS - Wound and Skin Assessment / Reassessment X - Simple Wound Assessment / Reassessment - one wound 1 5 '[]'$  - 0 Complex  Wound Assessment / Reassessment - multiple wounds '[]'$  - 0 Dermatologic / Skin Assessment (not related to wound area) ASSESSMENTS - Focused Assessment '[]'$  - Circumferential Edema Measurements - multi extremities 0 '[]'$  - 0 Nutritional Assessment / Counseling / Intervention '[]'$  - 0 Lower Extremity Assessment (monofilament, tuning fork, pulses) '[]'$  - 0 Peripheral Arterial Disease Assessment (using hand held doppler) ASSESSMENTS - Ostomy and/or Continence Assessment and Care '[]'$  - Incontinence Assessment and Management 0 '[]'$  - 0 Ostomy Care Assessment and Management (repouching, etc.) PROCESS - Coordination of Care X - Simple Patient / Family Education for ongoing care 1 15 '[]'$  - 0 Complex (extensive) Patient / Family Education for ongoing care X- 1 10 Staff obtains Consents, Records, Test Results / Process Orders '[]'$  - 0 Staff telephones HHA, Nursing Homes / Clarify orders / etc '[]'$  - 0 Routine Transfer to another Facility (non-emergent condition) '[]'$  - 0 Routine Hospital Admission (non-emergent condition) '[]'$  - 0 New Admissions / Biomedical engineer / Ordering NPWT, Apligraf, etc. '[]'$  - 0 Emergency Hospital Admission (emergent condition) X- 1 10 Simple Discharge Coordination '[]'$  - 0 Complex (extensive) Discharge Coordination PROCESS - Special Needs '[]'$  - Pediatric / Minor Patient Management 0 '[]'$  - 0 Isolation Patient Management '[]'$  - 0 Hearing / Language / Visual special needs '[]'$  - 0 Assessment of Community assistance (transportation, D/C planning, etc.) '[]'$  - 0 Additional assistance / Altered mentation '[]'$  - 0 Support Surface(s) Assessment (bed, cushion, seat, etc.) INTERVENTIONS - Wound Cleansing / Measurement Meidinger, Ronin C. ([id number]) X- 1 5 Simple Wound Cleansing - one wound '[]'$  - 0 Complex Wound Cleansing - multiple wounds X- 1 5 Wound Imaging (photographs - any number of wounds) '[]'$  - 0 Wound Tracing (instead of photographs) '[]'$  - 0 Simple Wound Measurement - one  wound '[]'$  - 0 Complex Wound Measurement - multiple wounds INTERVENTIONS - Wound Dressings '[]'$  - Small Wound Dressing  one or multiple wounds 0 '[]'$  - 0 Medium Wound Dressing one or multiple wounds X- 1 20 Large Wound Dressing one or multiple wounds '[]'$  - 0 Application of Medications - topical '[]'$  - 0 Application of Medications - injection INTERVENTIONS - Miscellaneous '[]'$  - External ear exam 0 '[]'$  - 0 Specimen Collection (cultures, biopsies, blood, body fluids, etc.) '[]'$  - 0 Specimen(s) / Culture(s) sent or taken to Lab for analysis '[]'$  - 0 Patient Transfer (multiple staff / Civil Service fast streamer / Similar devices) '[]'$  - 0 Simple Staple / Suture removal (25 or less) '[]'$  - 0 Complex Staple / Suture removal (26 or more) '[]'$  - 0 Hypo / Hyperglycemic Management (close monitor of Blood Glucose) '[]'$  - 0 Ankle / Brachial Index (ABI) - do not check if billed separately X- 1 5 Vital Signs Has the patient been seen at the hospital within the last three years: Yes Total Score: 90 Level Of Care: New/Established - Level 3 Electronic Signature(s) Signed: 01/16/2022 3:46:53 PM By: Gretta Cool, BSN, RN, CWS, Kim RN, BSN Entered By: Gretta Cool, BSN, RN, CWS, Kim on 01/16/2022 11:19:50 [name] ([id number]) -------------------------------------------------------------------------------- Encounter Discharge Information Details Patient Name: [name]. Date of Service: 01/16/2022 10:30 AM Medical Record Number: [id number] Patient Account Number: [id number] Date of Birth/Sex: 05-18-1956 (66 y.o. F) Treating RN: Cornell Barman Primary Care Nira Visscher: Ranae Plumber Other Clinician: Referring Kenijah Benningfield: Ranae Plumber Treating Darienne Belleau/Extender: Skipper Cliche in Treatment: 24 Encounter Discharge Information Items Discharge Condition: Stable Ambulatory Status: [place] Discharge Destination: Home Transportation: Other Schedule Follow-up Appointment: Yes Clinical Summary of Care: Electronic Signature(s) Signed:  01/16/2022 3:46:53 PM By: Gretta Cool, BSN, RN, CWS, Kim RN, BSN Entered By: Gretta Cool, BSN, RN, CWS, Kim on 01/16/2022 11:21:33 [name] ([id number]) -------------------------------------------------------------------------------- Lower Extremity Assessment Details Patient Name: [name], YADAO. Date of Service: 01/16/2022 10:30 AM Medical Record Number: [id number] Patient Account Number: [id number] Date of Birth/Sex: May 17, 1956 (66 y.o. F) Treating RN: Cornell Barman Primary Care Charbel Los: Ranae Plumber Other Clinician: Referring Bravlio Luca: Ranae Plumber Treating Kaylea Mounsey/Extender: Skipper Cliche in Treatment: 24 Vascular Assessment Pulses: Dorsalis Pedis Palpable: [Left:Yes] Electronic Signature(s) Signed: 01/16/2022 3:46:53 PM By: Gretta Cool, BSN, RN, CWS, Kim RN, BSN Entered By: Gretta Cool, BSN, RN, CWS, Kim on 01/16/2022 10:50:06 [name] ([id number]) -------------------------------------------------------------------------------- Multi Wound Chart Details Patient Name: [name]. Date of Service: 01/16/2022 10:30 AM Medical Record Number: [id number] Patient Account Number: [id number] Date of Birth/Sex: 28-Jan-1956 (66 y.o. F) Treating RN: Cornell Barman Primary Care Tametra Ahart: Ranae Plumber Other Clinician: Referring Quanita Barona: Ranae Plumber Treating Marikay Roads/Extender: Skipper Cliche in Treatment: 24 Vital Signs Height(in): 63 Pulse(bpm): 69 Weight(lbs): 372 Blood Pressure(mmHg): 119/82 Body Mass Index(BMI): 65.9 Temperature(F): 98.0 Respiratory Rate(breaths/min): 18 Photos: [N/A:N/A] Wound Location: Left, Medial, Anterior Lower Leg N/A N/A Wounding Event: Gradually Appeared N/A N/A Primary Etiology: Lymphedema N/A N/A Comorbid History: Cataracts, Chronic sinus N/A N/A problems/congestion, Anemia, Lymphedema, Asthma, Sleep Apnea, Tuberculosis, Angina, Hypertension, Gout, Osteoarthritis Date Acquired: 10/17/2021 N/A N/A Weeks of Treatment: 13 N/A N/A Wound  Status: Open N/A N/A Wound Recurrence: No N/A N/A Measurements L x W x D (cm) 1x1.2x0.1 N/A N/A Area (cm) : 0.942 N/A N/A Volume (cm) : 0.094 N/A N/A % Reduction in Area: 88.00% N/A N/A % Reduction in Volume: 88.00% N/A N/A Classification: Full Thickness Without Exposed N/A N/A Support Structures Exudate Amount: Medium N/A N/A Exudate Type: Serosanguineous N/A N/A Exudate Color: red, brown N/A N/A Granulation Amount: Medium (34-66%) N/A N/A Granulation Quality: Red  N/A N/A Necrotic Amount: Small (1-33%) N/A N/A Exposed Structures: Fat Layer (Subcutaneous Tissue): N/A N/A Yes Epithelialization: Medium (34-66%) N/A N/A Treatment Notes Electronic Signature(s) Signed: 01/16/2022 3:46:53 PM By: Gretta Cool, BSN, RN, CWS, Kim RN, BSN Entered By: Gretta Cool, BSN, RN, CWS, Kim on 01/16/2022 11:17:41 [name] ([id number]) -------------------------------------------------------------------------------- Multi-Disciplinary Care Plan Details Patient Name: [name], TORTORELLA. Date of Service: 01/16/2022 10:30 AM Medical Record Number: [id number] Patient Account Number: [id number] Date of Birth/Sex: Dec 14, 1955 (66 y.o. F) Treating RN: Cornell Barman Primary Care Silver Achey: Ranae Plumber Other Clinician: Referring Jailynne Opperman: Ranae Plumber Treating Maquita Sandoval/Extender: Skipper Cliche in Treatment: 24 Active Inactive Wound/Skin Impairment Nursing Diagnoses: Impaired tissue integrity Knowledge deficit related to ulceration/compromised skin integrity Goals: Ulcer/skin breakdown will have a volume reduction of 30% by week 4 Date Initiated: 08/01/2021 Target Resolution Date: 08/29/2021 Goal Status: Active Ulcer/skin breakdown will have a volume reduction of 50% by week 8 Date Initiated: 08/01/2021 Target Resolution Date: 09/26/2021 Goal Status: Active Ulcer/skin breakdown will have a volume reduction of 80% by week 12 Date Initiated: 08/01/2021 Target Resolution Date: 10/24/2021 Goal Status:  Active Ulcer/skin breakdown will heal within 14 weeks Date Initiated: 08/01/2021 Target Resolution Date: 11/07/2021 Goal Status: Active Interventions: Assess patient/caregiver ability to obtain necessary supplies Assess patient/caregiver ability to perform ulcer/skin care regimen upon admission and as needed Assess ulceration(s) every visit Provide education on ulcer and skin care Notes: Electronic Signature(s) Signed: 01/16/2022 3:46:53 PM By: Gretta Cool, BSN, RN, CWS, Kim RN, BSN Entered By: Gretta Cool, BSN, RN, CWS, Kim on 01/16/2022 11:17:34 [name], [name] ([id number]) -------------------------------------------------------------------------------- Pain Assessment Details Patient Name: [name]. Date of Service: 01/16/2022 10:30 AM Medical Record Number: [id number] Patient Account Number: [id number] Date of Birth/Sex: [birth year] (66 y.o. F) Treating RN: Cornell Barman Primary Care Brynlyn Dade: Ranae Plumber Other Clinician: Referring Tilford Deaton: Ranae Plumber Treating Danyla Wattley/Extender: Skipper Cliche in Treatment: 24 Active Problems Location of Pain Severity and Description of Pain Patient Has Paino No Site Locations Pain Management and Medication Current Pain Management: Notes Patient has pain in knees, not wound, Electronic Signature(s) Signed: 01/16/2022 3:46:53 PM By: Gretta Cool, BSN, RN, CWS, Kim RN, BSN Entered By: Gretta Cool, BSN, RN, CWS, Kim on 01/16/2022 10:46:50 [name] ([id number]) -------------------------------------------------------------------------------- Patient/Caregiver Education Details Patient Name: [name] Date of Service: 01/16/2022 10:30 AM Medical Record Number: [id number] Patient Account Number: [id number] Date of Birth/Gender: [birth year] (66 y.o. F) Treating RN: Cornell Barman Primary Care Physician: Ranae Plumber Other Clinician: Referring Physician: Ranae Plumber Treating Physician/Extender: Skipper Cliche in Treatment: 24 Education  Assessment Education Provided To: Patient Education Topics Provided Wound/Skin Impairment: Handouts: Caring for Your Ulcer Methods: Demonstration, Explain/Verbal Responses: State content correctly Electronic Signature(s) Signed: 01/16/2022 3:46:53 PM By: Gretta Cool, BSN, RN, CWS, Kim RN, BSN Entered By: Gretta Cool, BSN, RN, CWS, Kim on 01/16/2022 11:20:07 [name] ([id number]) -------------------------------------------------------------------------------- Wound Assessment Details Patient Name: [name], [name] Date of Service: 01/16/2022 10:30 AM Medical Record Number: [id number] Patient Account Number: [id number] Date of Birth/Sex: 12-18-1955 (66 y.o. F) Treating RN: Cornell Barman Primary Care Cristel Rail: Ranae Plumber Other Clinician: Referring Robbin Escher: Ranae Plumber Treating Dwayne Bulkley/Extender: Skipper Cliche in Treatment: 24 Wound Status Wound Number: 10 Primary Lymphedema Etiology: Wound Location: Left, Medial, Anterior Lower Leg Wound Open Wounding Event: Gradually Appeared Status: Date Acquired: 10/17/2021 Comorbid Cataracts, Chronic sinus problems/congestion, Anemia, Weeks Of Treatment: 13 History: Lymphedema, Asthma, Sleep Apnea, Tuberculosis, Angina, Clustered Wound: No Hypertension, Gout, Osteoarthritis Photos Wound Measurements Length: (cm) 1  Width: (cm) 1.2 Depth: (cm) 0.1 Area: (cm) 0.942 Volume: (cm) 0.094 % Reduction in Area: 88% % Reduction in Volume: 88% Epithelialization: Medium (34-66%) Tunneling: No Undermining: No Wound Description Classification: Full Thickness Without Exposed Support Structu Exudate Amount: Medium Exudate Type: Serosanguineous Exudate Color: red, brown res Foul Odor After Cleansing: No Slough/Fibrino Yes Wound Bed Granulation Amount: Medium (34-66%) Exposed Structure Granulation Quality: Red Fat Layer (Subcutaneous Tissue) Exposed: Yes Necrotic Amount: Small (1-33%) Necrotic Quality: Adherent Slough Treatment  Notes Wound #10 (Lower Leg) Wound Laterality: Left, Medial, Anterior Cleanser Peri-Wound Care Topical Primary Dressing Silvercel 4 1/4x 4 1/4 (in/in) Discharge Instruction: Apply Silvercel 4 1/4x 4 1/4 (in/in) as instructed [name], [name] ([id number]) Secondary Dressing ABD Pad 5x9 (in/in) Discharge Instruction: Cover with ABD pad Secured With Kerlix Roll Sterile or Non-Sterile 6-ply 4.5x4 (yd/yd) Discharge Instruction: Apply Kerlix as directed Compression Wrap Compression Stockings Add-Ons Electronic Signature(s) Signed: 01/16/2022 3:46:53 PM By: Gretta Cool, BSN, RN, CWS, Kim RN, BSN Entered By: Gretta Cool, BSN, RN, CWS, Kim on 01/16/2022 10:49:19 [name], [name] ([id number]) -------------------------------------------------------------------------------- [place] Details Patient Name: [name]. Date of Service: 01/16/2022 10:30 AM Medical Record Number: [id number] Patient Account Number: [id number] Date of Birth/Sex: 12-09-1955 (66 y.o. F) Treating RN: Cornell Barman Primary Care Alf Doyle: Ranae Plumber Other Clinician: Referring Bless Belshe: Ranae Plumber Treating Justina Bertini/Extender: Skipper Cliche in Treatment: 24 Vital Signs Time Taken: 10:45 Temperature (F): 98.0 Height (in): 63 Pulse (bpm): 87 Weight (lbs): 372 Respiratory Rate (breaths/min): 18 Body Mass Index (BMI): 65.9 Blood Pressure (mmHg): 119/82 Reference Range: 80 - 120 mg / dl Electronic Signature(s) Signed: 01/16/2022 3:46:53 PM By: Gretta Cool, BSN, RN, CWS, Kim RN, BSN Entered By: Gretta Cool, BSN, RN, CWS, Kim on 01/16/2022 10:46:07

## 2022-01-23 ENCOUNTER — Encounter: Payer: Medicare HMO | Admitting: Physician Assistant

## 2022-01-25 ENCOUNTER — Ambulatory Visit (HOSPITAL_BASED_OUTPATIENT_CLINIC_OR_DEPARTMENT_OTHER): Payer: Medicare HMO | Admitting: Family

## 2022-01-25 ENCOUNTER — Encounter: Payer: Medicare HMO | Admitting: Physician Assistant

## 2022-01-25 ENCOUNTER — Telehealth: Payer: Self-pay | Admitting: Family

## 2022-01-25 ENCOUNTER — Encounter: Payer: Self-pay | Admitting: Family

## 2022-01-25 ENCOUNTER — Other Ambulatory Visit
Admission: RE | Admit: 2022-01-25 | Discharge: 2022-01-25 | Disposition: A | Discharge status: Home / Self Care | Payer: Medicare HMO | Source: Ambulatory Visit | Attending: Family | Admitting: Family

## 2022-01-25 VITALS — BP 141/96 | HR 93 | Resp 18 | Ht 63.0 in | Wt 389.4 lb

## 2022-01-25 DIAGNOSIS — G4733 Obstructive sleep apnea (adult) (pediatric): Secondary | ICD-10-CM

## 2022-01-25 DIAGNOSIS — I130 Hypertensive heart and chronic kidney disease with heart failure and stage 1 through stage 4 chronic kidney disease, or unspecified chronic kidney disease: Secondary | ICD-10-CM | POA: Insufficient documentation

## 2022-01-25 DIAGNOSIS — G4730 Sleep apnea, unspecified: Secondary | ICD-10-CM | POA: Insufficient documentation

## 2022-01-25 DIAGNOSIS — J45909 Unspecified asthma, uncomplicated: Secondary | ICD-10-CM | POA: Insufficient documentation

## 2022-01-25 DIAGNOSIS — E669 Obesity, unspecified: Secondary | ICD-10-CM | POA: Insufficient documentation

## 2022-01-25 DIAGNOSIS — I5032 Chronic diastolic (congestive) heart failure: Secondary | ICD-10-CM | POA: Insufficient documentation

## 2022-01-25 DIAGNOSIS — N1832 Chronic kidney disease, stage 3b: Secondary | ICD-10-CM

## 2022-01-25 DIAGNOSIS — Z87891 Personal history of nicotine dependence: Secondary | ICD-10-CM | POA: Insufficient documentation

## 2022-01-25 DIAGNOSIS — I890 Lymphedema, not elsewhere classified: Secondary | ICD-10-CM

## 2022-01-25 DIAGNOSIS — L97811 Non-pressure chronic ulcer of other part of right lower leg limited to breakdown of skin: Secondary | ICD-10-CM | POA: Diagnosis not present

## 2022-01-25 DIAGNOSIS — N183 Chronic kidney disease, stage 3 unspecified: Secondary | ICD-10-CM | POA: Insufficient documentation

## 2022-01-25 DIAGNOSIS — M1990 Unspecified osteoarthritis, unspecified site: Secondary | ICD-10-CM | POA: Insufficient documentation

## 2022-01-25 DIAGNOSIS — R220 Localized swelling, mass and lump, head: Secondary | ICD-10-CM | POA: Insufficient documentation

## 2022-01-25 DIAGNOSIS — Z9989 Dependence on other enabling machines and devices: Secondary | ICD-10-CM

## 2022-01-25 LAB — BASIC METABOLIC PANEL
Anion gap: 13 (ref 5–15)
BUN: 49 mg/dL — ABNORMAL HIGH (ref 8–23)
CO2: 25 mmol/L (ref 22–32)
Calcium: 9.4 mg/dL (ref 8.9–10.3)
Chloride: 103 mmol/L (ref 98–111)
Creatinine, Ser: 1.69 mg/dL — ABNORMAL HIGH (ref 0.44–1.00)
GFR, Estimated: 33 mL/min — ABNORMAL LOW (ref >60)
Glucose, Bld: 94 mg/dL (ref 70–99)
Potassium: 3.6 mmol/L (ref 3.5–5.1)
Sodium: 141 mmol/L (ref 135–145)

## 2022-01-25 NOTE — Patient Instructions (Signed)
Continue weighing daily and call for an overnight weight gain of 3 pounds or more or a weekly weight gain of more than 5 pounds.   If you have voicemail, please make sure your mailbox is cleaned out so that we may leave a message and please make sure to listen to any voicemails.     

## 2022-01-25 NOTE — Progress Notes (Addendum)
[name], [name] ([id number]) Visit Report for 01/25/2022 Chief Complaint Document Details Patient Name: [name], [name]. Date of Service: 01/25/2022 9:00 AM Medical Record Number: [id number] Patient Account Number: [id number] Date of Birth/Sex: [birth year] (66 y.o. F) Treating RN: [name] Dredge Primary Care Provider: Ranae Plumber Other Clinician: Massie Kluver Referring Provider: Ranae Plumber Treating Provider/Extender: Skipper Cliche in Treatment: 25 Information Obtained from: Patient Chief Complaint Bilateral LE lymphedema with Left LE Ulcers Electronic Signature(s) Signed: 01/25/2022 9:02:09 AM By: Worthy Keeler PA-C Entered By: Worthy Keeler on 01/25/2022 09:02:08 [name] ([id number]) -------------------------------------------------------------------------------- HPI Details Patient Name: [name] Date of Service: 01/25/2022 9:00 AM Medical Record Number: [id number] Patient Account Number: [id number] Date of Birth/Sex: 12-Feb-1956 (66 y.o. F) Treating RN: [name] Dredge Primary Care Provider: Ranae Plumber Other Clinician: Massie Kluver Referring Provider: Ranae Plumber Treating Provider/Extender: Skipper Cliche in Treatment: 25 History of Present Illness HPI Description: The patient is a 66 year old female with history of hypertension and a long-standing history of bilateral lower extremity lymphedema (first presented on 4/2) . She has had open ulcers in the past which have always responded to compression therapy. She had briefly been to a lymphedema clinic in the past which helped her at the time. this time around she stopped treatment of her lymphedema pumps approximately 2 weeks ago because of some pain in the knees and then noticed the right leg getting worse. She was seen by her PCP who put her on clindamycin 4 times a day 2 days ago. The patient has seen [facility] and Dr. Delana Meyer had seen her last year where a vascular study including  venous and arterial duplex studies were within normal limits. he had recommended compression stockings and lymphedema pumps and the patient has been using this in about 2 weeks ago. She is known to be diabetic but in the past few time she's gone to her primary care doctor her hemoglobin A1c has been normal. 02/11/2015 - after her last visit she took my advice and went to the ER regarding the progressive cellulitis of her right lower extremity and she was admitted between July 17 and 22nd. She received IV antibiotics and then was sent home on a course of steroid-induced and oral antibiotics. She has improved much since then. 02/17/2015 -- she has been doing fine and the weeping of her legs has remarkably gone down. She has no fresh issues. READMISSION 01/15/18 This patient was given this clinic before most recently in 2016 seen by Dr. Con Memos. She has massive bilateral lymphedema and over the last 2 months this had weeping edema out of the left leg. She has compression pumps but her compliance with these has been minimal. She has [facility] they've been using TCA/ABDs/kerlix under an Ace wrap.she has had recent problems with cellulitis. She was apparently seen in the ER and 12/23/17 and given clindamycin. She was then followed by her primary doctor and given doxycycline and Keflex. The pain seems to have settled down. In April 2018 the patient had arterial studies done at [facility]. This showed triphasic waveforms throughout the right leg and mostly triphasic waveforms on the left except for monophasic at the posterior tibial artery distally. She was not felt to have evidence of right lower extremity arterial stenosis or significant problems on the left side. 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 She also had a right lower extremity venous Doppler in January 2018 this was limited  by the patient's body habitus and lymphedema. Most of the  proximal veins were not visualized The patient presents with an area of denuded skin on the anterior medial part of the left calf. There is weeping edema fluid here. 01/22/18; the patient has somewhat better edema control using her compression pumps twice a day and as a result she has much better epithelialization on the left anterior calf area. Only a small open area remains. 01/29/18; the patient has been compliant with her compression pumps. Both the areas on her calf that healed. The remaining area on the left anterior leg is fully epithelialized Readmission: 02/20/2019 upon evaluation today patient presents for reevaluation due to issues that she is having with the bilateral lower extremities. She actually has wounds open on both legs. On the right she has an area in the crease of her leg on the right around the knee region which is actually draining quite a bit and actually has some fungal type appearance to it. She has been on nystatin powder that seems to have helped to some degree. In regard to the left lower extremity this is actually in the lower portion of her leg closer to the ankle and again is continuing to drain as well unfortunately. There does not appear to be any signs of active infection at this time which is good news. No fevers, chills, nausea, vomiting, or diarrhea. She tells me that since she was seen last year she is actually been doing quite well for the most part with regard to her lower extremities. Unfortunately she now is experiencing a little bit more drainage at this time. She is concerned about getting this under control so that it does not get significantly worse. 02/27/2019 on evaluation today patient appears to be doing somewhat better in regard to her bilateral lower extremity wounds. She has been tolerating the dressing changes without complication. Fortunately there is no signs of active infection at this point. No fevers, chills, nausea, vomiting, or diarrhea.  She did get her dressing supplies which is excellent news she was extremely excited to get these. She also got paperwork from [employer] for their financial assistance program where they may be able to help her out in the future if needed with supplies at discounted prices. 03/06/2019 on evaluation today patient appears to be doing a little worse with regard to both areas of weeping on her bilateral lower extremities. This is around the right medial knee and just above the left ankle. With that being said she is unfortunately not doing as well as I would like to see. I feel like she may need to potentially go see someone at the lymphedema clinic as the wraps that she needs or even beyond what we can do here at the wound care center. She really does not have wounds she just has open areas of weeping that are causing some difficulty for her. Subsequently because of this and the moisture I am concerned about the potential for infection I am going to likely give her a prophylactic antibiotic today, Keflex, just to be on the safe side. Nonetheless again there is no obvious signs of active infection at this time. 03/13/2019 on evaluation today patient appears to be doing well with regard to her bilateral lower extremities where she has been weeping compared to even last week's evaluation. I see some areas of new skin growth which is excellent and overall I am very pleased with how things seem to be progressing. No fevers, chills, nausea,  vomiting, or diarrhea. BRANDILYN, NANNINGA ([id number]) 03/20/2019 on evaluation today patient unfortunately is continuing to have issues with significant edema of the left lower extremity. Her right side seems to be doing much better. Unfortunately her left side is showing increased weeping of the lower portion of her leg. This is quite unfortunate obviously we were hoping to get her into the lymphedema clinic they really do not seem to when I see her how if she is draining. Despite  the fact this is really not wound related but more lymphedema weeping related. Nonetheless I do not know that this can be helpful for her to even go for that appointment since again I am not sure there is much that they would actually do at this point. We may need to try a 4 layer compression wrap as best we can on her leg. She is on the Augmentin currently although I am still concerned about whether or not there could be potentially something going on infection wise I would obtain a culture though I understand is not the best being that is a surface culture I just 1 to make sure I do not seem to be missing anything. 03/27/2019 on evaluation today patient appears to be doing much better in regard to the left lower extremity compared to last week. Last week she had tremendous weeping which I think was subsequent to infection now she seems to be doing much better and very pleased. This is not completely healed but there is a lot of new skin growth and it has dried out quite a bit. Overall I think that we are doing well with how things are moving along at this time. No fevers, chills, nausea, vomiting, or diarrhea. 04/03/2019 evaluation today patient appears to be doing a little worse this week compared to last time I saw her. I think this may be due to the fact that she is having issues with not being able to sleep in her bed at least not until last night. She is therefore been in a lift chair and subsequently has also had issues with not been able to use her pumps since she could not get in bed. With that being said the patient overall seems to be doing okay I do think I may want extend the antibiotic for a little bit longer at least until we can see if her edema and her weeping gets better and if it is then obviously I can always discontinue the antibiotics as of next week however I want her to continue to have it over the next week. 04/10/2019 on evaluation today patient unfortunately is still doing  poorly with regard to her left lower extremity. Her right is all things considering doing fairly well. On the left however she continues to have spreading of the area of infection and weeping which appears to be even a larger surface area than noted last week. She did have a positive culture for Pseudomonas in particular which seems to have been of concern she still has green/yellow discharge consistent with Pseudomonas and subsequently a tremendous amount of it. This has me obviously still concerned about the infection not really clearing up despite the fact that on culture it appears the Cipro should have been a good option for treating this. I think she may at this point need IV antibiotics since things are not doing better I do not want to get worse and cause sepsis. She is in agreement with the plan and believes as well that  she likely does need to go to the hospital for IV vancomycin. Or something of the like depending on what the recommendation is from the ER. 04/17/2019 on evaluation today patient appears to be doing excellent in regard to her lower extremity on the left. She was in the hospital for several days from when I sent her last we saw her until just this past Tuesday. Fortunately her drainage is significantly improved and in fact is mostly clear. There is just a couple small areas that may still drain a little bit she states that the [facility] they prescribed for her at discharge she went picked up from pharmacy and got home but has not been able to find it since. She is looked everywhere. She is wondering if I will replace that for her today I will be more than happy to do that. 05/01/2019 on evaluation today patient actually appears to be doing quite well with regard to her lower extremities. She occasionally is having areas that will leak and then heal up mainly when a piece of the fibrotic skin pops off but fortunately she is not having any signs of active infection at this time.  Overall she also really does not have any obvious weeping at this time. I do believe however she really needs some compression wraps and I think this may be a good time to get her back to the lymphedema clinic. 05/11/2019 on evaluation today patient actually appears to be doing quite well with regard to her bilateral lower extremities. She occasionally will have a small area that we per another but in general seems to be completely healed which is great news. Overall very pleased with how everything seems to be progressing. She does have her appointment with lymphedema clinic on November 18. 05/25/2019 on evaluation today patient appears to be doing well with regard to her left lower extremity. I am very pleased in this regard. In regard to her right leg this actually did start draining more I think it is mainly due to the fact that her leg is more swollen. I am not seeing any obvious signs of infection at this time although that is definitely something were obviously acutely aware of simply due to the fact that she had an issue not too far back with exactly this issue. Nonetheless I do feel like that lymphedema clinic would still be beneficial for her. I explained obviously if they are not able to do anything treatment wise on the right leg we could at least have them treat her left leg and then proceed from there. The patient is really in agreement with that plan. If they are able to do both as the drainage slows down that I would be happy to let them handle both. 06/01/2019 on evaluation today patient unfortunately appears to be doing worse with regard to her right lower extremity. The left lower extremity is still maintaining at this point. Unfortunately she has been having significantly increased pain over the past several days and has been experiencing as well increased swelling of the right lower extremity. I really do not know that I am seeing anything that appears to be obvious for infection  at this point to be peripherally honest. With that being said the patient does seem to be having much more swelling that she is even experienced in the past and coupled with increased pain in her hip as well I am concerned that again she could potentially have a DVT although I am not 100% sure  of this. I think it something that may need to be checked out. We discussed the possibility of sending her for a DVT study through the hospital but unfortunately transportation is an issue if she does have a DVT I do not want her to wait days to be able to get in for that test however if she has this scheduled as an outpatient that is as fast that she will be able to get the test scheduled for transportation purposes. That will also fall on Thanksgiving so subsequently she did actually be looking at either Friday or even next week before we would know anything back from this. That is much too long in my opinion. Subsequent to the amount of discomfort she is experiencing the patient is actually okay with going to the ER for evaluation today. 06/12/2019 on evaluation today patient actually appears to be doing significantly better compared to last time I saw her. Following when I last saw her she was actually in the hospital from that Monday until the following Sunday almost 1 full week. She actually was placed on Keflex in the hospital following the time for her to be discharged and Dr. Steva Ready has recommended 2 times a day dosing of the Keflex for the next year in order to help with more prophylactic/preventative measures with regard to her developing cellulitis. Overall I think this sounds like an excellent plan. The patient unfortunately is good to have trouble being treated at lymphedema clinic due to the fact that she really cannot get up on the bed that they have there. They also state that they cannot manage her as long as she has anything draining at this point. Obviously that is somewhat unfortunate as  she does need help with edema control but nonetheless we will have to do what we can for her outside of it sounds like the lymphedema clinic scenario at this point. 06/19/2019 on evaluation today patient appears to be doing fairly well with regard to her bilateral lower extremities. She is not nearly as swollen and shows no signs of infection at this point. There is no evidence of cellulitis whatsoever. She also has no open wounds or draining at this point which is also good news. No fever chills noted. She seems to be in very good spirits and in fact appears to be doing quite well. READMISSION 11/27/2019 [name], [name] ([id number]) This is a 66 year old woman that we have had in this clinic several times before including 2015, 16 and 19 and then most recently from 03/20/2019 through 06/19/2019 with bilateral lower extremity lymphedema. She has had previous arterial and reflux studies done years ago which were not all that remarkable. 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 She does have a positive family history and she had large legs starting may be in her 51s. She was recently in hospital from 10/20/2019 through 10/28/2019 with right leg cellulitis. She was given Ancef and clindamycin and then Zosyn when a culture showed Pseudomonas. At that time there was purulent drainage. She was followed by infectious disease Dr Steva Ready. The patient is now back at home. She has noted increased swelling in the right and no drainage in her right leg mostly on the posterior medial aspect in the calf area. She has not had pain or fever. She has literally been improved lysing above dressings because her at the area of this is far too large for standard compression. She has been wrapping the areas  with sheets to resorptive pads. She is found these helped somewhat. She does have an appointment with the lymphedema clinic in [place] in late June. Past  medical history includes bilateral lymphedema, hypertension, obstructive sleep apnea with CPAP. Recent hospitalization with apparently Pseudomonas cellulitis of the right lower leg 12/15/2019 upon evaluation today patient appears to be doing a little bit worse in regard to her right lower extremity. Unfortunately she is having more weeping down in the lower portion of her leg. Fortunately there is no signs of active infection at this time. No fever chills noted. The patient states she is not having increased pain except for when she attempted to use the lymphedema pumps unfortunately she states that she did have pain when she did this. Otherwise we been using absorptive dressings of one type or another she is using diapers at home and then subsequently Ace wraps. In regard to the barrier cream we have discussed the possibility of derma cloud which she would like to try I do not have a problem with that. 12/22/2019 upon evaluation today patient actually appears to be doing better in regard to her leg ulcers at this point. Fortunately there does not appear to be any signs of active infection which is great news and I am extremely pleased with where things are progressing at this time. There is no sign of active infection currently. The patient is very pleased to see things doing so well. 12/29/2019 upon evaluation today patient appears to be doing a little bit better in regard to her weeping in general over her lower extremities. She does have some signs of mild erythema little bit more than what I noted last week or rather last visit. Nonetheless I think that my threshold for switching her antibiotics from Keflex to something else is very low at this point considering that she has had such severe infections in the past that seem to come almost out of nowhere. There is a little erythema and warmth noted of the lower portion of her leg compared to the upper which also makes me want to go ahead and address  things more rapidly at this point. Likely I would switch out the Keflex for something like Levaquin ideally. 7/16; patient with severe bilateral lymphedema. She has superficial wounds albeit almost circumferential now on the left lateral lower leg. This may be new from last time. Small area on the right anterior lower leg and then another area on the right medial lower leg and of pannus fold. She has been using various absorptive garments. She states she is using her compression pumps once a day occasionally twice. Culture from her last visit here was negative 01/29/2020 on evaluation today patient appears to be doing excellent at this point in regard to her legs with regard to infection I see no signs of active infection at this point. She still does have unfortunately areas of weeping this is minimal on the right now her left is actually significantly worse although I do not think it is as bad as last week with Dr. Dellia Nims saw her. She has been trying to pump and elevate her legs is much as possible. She has previously been on the Keflex and in the past for prevention that seems to do fairly well and likely can extend that today. 02/04/2020 on evaluation today patient appears to be doing better in regard to her legs bilaterally. Fortunately there is no signs of active infection at this time which is great news and overall  she has less weeping on the left compared to the right and there is several spots where she is pretty much sealed up with no draining regions. Overall very pleased in this regard. 02/19/2020 on evaluation today patient appears to be doing very well in regard to her wounds currently. Fortunately there is no evidence of active infection overall very pleased with where things stand. She is significantly improved in regard to her edema I am extremely pleased in this regard she tells me that the popping no longer hurts and in fact she actually looks forward to it. 03/04/2020 on evaluation  today patient appears to be doing excellent in regard to her lower extremities. Fortunately there is no signs of active infection at this time. No fevers, chills, nausea, vomiting, or diarrhea. 03/25/2020 on evaluation today patient appears to be doing a little bit more poorly in regard to her legs at this point. She tells me that she is still continue to have issues with drainage and this has been a little bit worse she was getting ready to start taking the Keflex again but wanted to see me first. Fortunately there is no signs of active infection at this time. No fevers, chills, nausea, vomiting, or diarrhea. 04/15/2020 upon evaluation today patient appears to be doing somewhat poorly in regard to her right leg. She tells me she has been having more pain she has been taking the Keflex that was previously prescribed unfortunately that just does not seem to help with this. She was hoping that the pain on her right was actually coming from the fact that she was having issues with her wrap having gotten caught in her recliner. With that being said she tells me that she knew something was not right. Currently her right leg is warm to touch along with being erythematous all the way up to around at least mid thigh as far as I can see. The left leg does not appear to be doing that badly though there is increased weeping around the ankle region. 05/06/2020 on evaluation today patient appears to be doing much better than last time I saw her. She did go to the hospital where she was admitted for 2 days and treated with antibiotic therapy. She was discharged with antibiotics as well and has done extremely well. I am extremely pleased with where things stand today. There is no signs of active infection at this time which is great news. 05/27/2020 upon evaluation today patient appears to be doing well with regard to her lower extremities bilaterally. She has just a very tiny area on the right leg which is opening on  the left leg she is significantly improved though she still has several areas that do appear to be open this is minimal compared to what is been in the past. In general I am extremely pleased with where things stand today. The patient does tell me she is not been using her pumps quite as much as she should be. I do believe that is 1 area she can definitely work on. She has had a lot going on including a Covid exposure and apparently also a outbreak of likely shingles. 06/24/2020 upon evaluation today patient appears to be doing well with regard to her legs in general although the left leg unfortunately is showing some signs of erythema she does have a little bit of increased weeping and to be honest I am concerned about infection here. I discussed that with her today and I think that we  may need to address this sooner rather than later she has been taking Keflex she is not really certain that is been making a big improvement however. No fevers, chills, nausea, vomiting, or diarrhea. [name], [name] ([id number]) 06/30/2020 upon evaluation today patient's legs actually seem to be doing better in my opinion as compared to where they were last week. Fortunately there does not appear to be any signs of active infection. Her culture showed multiple organisms nothing predominate. With that being said the Levaquin seems to have done well I think she has improved since I last saw her as well. 07/14/2020 upon evaluation today patient appears to be doing actually better in regard to her lower extremities in my opinion. She has been tolerating the dressing changes without complication. Fortunately there is no signs of active infection at this time. 08/04/2020 upon evaluation today patient appears to be doing excellent in regard to her leg ulcers. Fortunately she has very little that is open at this point. This is great news. In fact I think that the [employer] medicated powder has been excellent for her. It seems to  have done the trick where we had tried several other things without as much success. Fortunately there is no evidence of active infection at this time. No fevers, chills, nausea, vomiting, or diarrhea. 09/01/2020 upon evaluation today patient appears to be doing a little bit more poorly than the last time I saw her. She tells me right now that she has been having a lot of drainage compared to where things were previous which has unfortunately led to more irritation as well. She is concerned that this is leading to infection. Again based on what I am seeing today as well I am also concerned of the same to be honest. 09/15/2020 upon evaluation today patient appears to be doing well with regard to her legs compared to what I saw previous. Fortunately there does not appear to be any evidence of active infection at this time which is great news. At least not as badly as what it was previous. With that being said I do believe that the patient does need to have an extension of the antibiotics. This I believe will actually help her more in the way of making sure this stays under control and does not worsen. That was the Bactrim DS. Readmission: 08/01/2021 upon evaluation today patient appears to be doing actually pretty well all things considered. Is actually been almost a year since have seen her last March. Fortunately I do not see any evidence of active infection locally nor systemically at this point. She does have a couple open areas on the left leg the right leg appears to be doing quite well. In general she has been in the hospital 3 times since I saw her a year ago all 4 cellulitis type issues except for the last 1 which was actually more related to congestive heart failure for that reason she is not using her lymphedema pumps at this point and that is probably the best idea. 08/08/2021 upon evaluation today patient appears to be doing well with regard to her legs all things considered her left leg may  be a little bit more swollen based on what I see today. Fortunately I do not see any signs of active infection locally nor systemically at this time which is great news. No fevers, chills, nausea, vomiting, or diarrhea. 08/15/2021 upon evaluation today patient actually appears to be doing excellent in regard to her wounds. She has  been tolerating the dressing changes without complication. Fortunately I see no evidence of infection currently which is great news. No fevers, chills, nausea, vomiting, or diarrhea. 08/29/2021 upon evaluation patient appears to be doing excellent she in fact is almost completely healed. I am actually very pleased with where we stand today and I think that she is making wonderful progress she just has a small area of weeping on the right lateral leg everything else is pretty much completely healed which is also. 09/05/2021 upon evaluation today patient appears to be doing well with regard to her legs. She does have a small area of weeping on the right leg and there is a small area on the left leg as well. It is potentially something that may need to be addressed in the future more specifically but right now I think that there may have just been a little drainage here on the left. With all that being said I think that this still does not appear to be infected and overall I feel like she is doing quite well. That something we always have to keep a very close eye on as she can change very rapidly from okay to not okay. 09/12/2021 upon evaluation today patient appears to be doing a little bit worse in regard to her leg and actually somewhat concerned about the possibility of infection here. I discussed that with the patient. For that reason I am going to go ahead and see about getting her set up for a repeat prescription for the Bactrim. She is in agreement with that plan. 3/14; patient presents for follow-up. She has been using silver alginate with dressing changes. She is still  taking Bactrim prescribed at last clinic visit. She has no issues or complaints today. She has lymphedema pumps however does not use these. 09/26/2020 upon evaluation today patient appears to actually be doing well in regard to her right leg I am pleased in that regard. Unfortunately she has new areas on her left leg that are open at this point that I do need to be addressed. I am also concerned about the warmth around one of the wounds on the more anterior side. I think this is something that we may need to culture today potentially requiring antibiotics going forward. 10/03/2021 upon evaluation today patient unfortunately is not doing nearly as well as she was even last week with her wounds. She is having some issues here with increased cellulitis of the left lower extremity unfortunately. I did prescribe Augmentin for her eczema prescribed last week unfortunately she never actually received this prescription. The pharmacy stated they never got it. We double checked with them today they still did not receive it. I am not sure what happened in that regard. With that being said the patient is in good spirits today she does not appear to be as sick as where she normally is when the cellulitis gets significantly worse as it has in the past. Nonetheless the leg is much more red not just around the ankle where I was seeing at last week on Tuesday but rather now this is going all the way up to almost her knee and is definitely hot to touch compared to the right leg. Nonetheless I feel like she does need to go to the ER for likely IV antibiotics. 10-10-2021 upon evaluation today patient appears to be doing well with regard to her wound. She has been tolerating the dressing changes that appears to be doing much better. After I saw  her last week she did go to the ER they admitted her to the hospital and gave her IV antibiotics she fortunately is doing significantly better. Overall I am extremely pleased with where  things stand today. 10-17-2021 upon evaluation today patient appears to be doing well with regard to her wound. In fact this is looking better and her leg is looking better but she still does have some areas here of erythema. We will continue to address this as soon as possible in my opinion. I do believe she may require some debridement and what appears to be a more defined wound on her left leg at this point. 10-24-2021 upon evaluation patient is definitely showing signs of improvement which is great news. I do not see any evidence of active infection locally or systemically which is great news as well. No fevers, chills, nausea, vomiting, or diarrhea. 10-31-2021. Upon evaluation today patient's leg actually feels better from an infection and wound care perspective. I am actually very pleased in this regard. Unfortunately the biggest issue that I see at this time is that the patient is having a significant issue with her breathing. I did put her on the pulse ox machine to check her oxygen saturation and it was pretty much at 100% which is good but she tells me that when she is walking [place], Kaedynce C. ([id number]) this is much more difficult for her. She also tells me that when she is trying to bend over to perform her dressing changes she is so short of breath due to the swelling and fluid in her abdominal area that she is just not been able to do this. She is tearful and very concerned today to be honest. I am truly sorry to see her this way I know she is really having a hard time at the moment. 11-14-2021 upon evaluation patient actually appears to be doing significantly better compared to when I last saw her. She was actually admitted to the hospital I believe this for 6 days total after I sent her on 10-31-2021. Subsequently they actually pulled off greater than 50 pounds of fluid she tells me she feels significantly better her legs are not as tight she actually has some play in the tissue and it is  obvious that she is doing much better just from a mobility standpoint as well as a breathing standpoint. Overall I am extremely happy for her and how she is doing the leg also looks much better on the left. 11-21-2021 upon evaluation today patient appears to be doing well although it does appear that she is going require some sharp debridement the wound is very dry this is the opposite of what its been she was having so much weeping that it was staying extremely wet. Nonetheless we do need to see about going ahead and debriding the wound today. 11-28-2021 upon evaluation today patient appears to be doing well with regard to her wound I definitely see signs of things continuing to improve which is great news. I do not see any evidence of active infection locally or systemically also great news. 12-05-2021 upon evaluation today patient appears to be doing well with regard to her wound. She has been tolerating the dressing changes. Fortunately there does not appear to be any signs of active infection locally or systemically at this time. No fevers, chills, nausea, vomiting, or diarrhea. 12-12-2021 upon evaluation patient appears to be doing well with regard to her wound this is actually measuring smaller and looking much  better. Fortunately I do not see any signs of active infection locally or systemically at this time which is excellent news. 6/13; small wound area in the folds of her lymphedema just above the left ankle. Fortunately the area looks quite good. She has been using silver alginate ABDs and an Ace wrap which she is able to change her self. She is not using her compression pumps out of fear that this could contribute to heart failure. 12-26-2021 upon evaluation patient's wounds are actually showing signs of excellent improvement. I am very pleased with where things stand. Overall I think that she has been making excellent progress and I think we are very close to complete resolution which is  great news. 01-11-2022 upon evaluation today patient appears to be doing well currently in regard to her legs in general although on the left leg she does have 1 area that still irritated and inflamed on the anterior portion of her leg. Fortunately I do not see any signs of systemic infection locally there might be some infection going on here however which is my biggest concern. Fortunately I do not see any evidence though of this spreading to any other location. 01-16-2022 upon evaluation today patient has actually showing signs of excellent improvement. Fortunately I do not see any evidence of infection locally or systemically which is great news and overall I am very pleased with where we stand at this point. 01-25-2022 upon evaluation today patient actually appears to be doing decently well in regard to her legs. She has been tolerating the dressing changes without complication. Fortunately there does not appear to be any evidence of active infection locally or systemically which is great news and overall I am extremely pleased with where we stand at this point. Electronic Signature(s) Signed: 01/25/2022 9:21:58 AM By: Worthy Keeler PA-C Entered By: Worthy Keeler on 01/25/2022 09:21:58 [name] ([id number]) -------------------------------------------------------------------------------- Physical Exam Details Patient Name: [name], [name] Date of Service: 01/25/2022 9:00 AM Medical Record Number: [id number] Patient Account Number: [id number] Date of Birth/Sex: 1956/06/30 (66 y.o. F) Treating RN: [name] Dredge Primary Care Provider: Ranae Plumber Other Clinician: Massie Kluver Referring Provider: Ranae Plumber Treating Provider/Extender: Skipper Cliche in Treatment: 25 Constitutional Obese and well-hydrated in no acute distress. Respiratory normal breathing without difficulty. Psychiatric this patient is able to make decisions and demonstrates good insight into  disease process. Alert and Oriented x 3. pleasant and cooperative. Notes Patient's wound bed showed evidence of excellent healing she is very close to complete closure she does have a few blisters down further around the ankle. Subsequently I think this is due to likely just a little irritation from the Ace wrap which has been kind of sliding around on her. Nonetheless I do believe that she needs to watch this area and subsequently we will see how things go. She has not used her pumps yet but tells me that she is planning to try over the weekend. She has been a little reluctant to do so because of the congestive heart failure. Although her numbers are now better and she does see the heart failure clinic when she leaves here today. Electronic Signature(s) Signed: 01/25/2022 9:22:39 AM By: Worthy Keeler PA-C Entered By: Worthy Keeler on 01/25/2022 09:22:39 [name] ([id number]) -------------------------------------------------------------------------------- Physician Orders Details Patient Name: [name] Date of Service: 01/25/2022 9:00 AM Medical Record Number: [id number] Patient Account Number: [id number] Date of Birth/Sex: 08-10-1955 (66 y.o. 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 o Nurse Visit as needed Bathing/ Shower/ Hygiene o Clean wound with Normal Saline or wound cleanser. o Wash wounds with antibacterial soap and water. o May shower; gently  cleanse wound with antibacterial soap, rinse and pat dry prior to dressing wounds o No tub bath. Edema Control - Lymphedema / Segmental Compressive Device / Other o Ace wraps o Elevate, Exercise Daily and Avoid Standing for Long Periods of Time. o Elevate leg(s) parallel to the floor when sitting. o DO YOUR BEST to sleep in the bed at night. DO NOT sleep in your recliner. Long hours of sitting in a recliner leads to swelling of the legs and/or potential wounds on your backside. o Other: - use lymphedema pumps Non-Wound Condition Bilateral Lower Extremities o Cleanse affected area with antibacterial soap and water, o Apply appropriate compression. - ace wrap o Additional non-wound orders/instructions: - silver cell to open weeping areas with abd pad Medications-Please add to medication list. o P.O. Antibiotics - continue Doxycycline Wound Treatment Electronic Signature(s) Signed: 01/25/2022 5:07:46 PM By: Worthy Keeler PA-C Signed: 01/26/2022 4:34:39 PM By: Massie Kluver Entered By: Massie Kluver on 01/25/2022 09:14:00 Canoy, [name] ([id number]) -------------------------------------------------------------------------------- Problem List Details Patient Name: [name], [name]. Date of Service: 01/25/2022 9:00 AM Medical Record Number: [id number] Patient Account Number: [id number] Date of Birth/Sex: 1956-03-14 (66 y.o. F) Treating RN: [name] Dredge Primary Care Provider: Ranae Plumber Other Clinician: Massie Kluver Referring Provider: Ranae Plumber Treating Provider/Extender: Skipper Cliche in Treatment: 25 Active Problems ICD-10 Encounter Code Description Active Date MDM Diagnosis Q82.0 Hereditary lymphedema 08/01/2021 No Yes L97.811 Non-pressure chronic ulcer of other part of right lower leg limited to 08/01/2021 No Yes breakdown of skin L97.821 Non-pressure chronic ulcer of other part of left lower leg limited to 08/01/2021 No Yes breakdown of  skin Inactive Problems Resolved Problems Electronic Signature(s) Signed: 01/25/2022 9:02:01 AM By: Worthy Keeler PA-C Entered By: Worthy Keeler on 01/25/2022 09:02:00 Dykman, [name] ([id number]) -------------------------------------------------------------------------------- Progress Note Details Patient Name: [name]. Date of Service: 01/25/2022 9:00 AM Medical Record Number: [id number] Patient Account Number: [id number] Date of Birth/Sex: Jan 29, 1956 (66 y.o. F) Treating RN: [name] Dredge Primary Care Provider: Ranae Plumber Other Clinician: Massie Kluver Referring Provider: Ranae Plumber Treating Provider/Extender: Skipper Cliche in Treatment: 25 Subjective Chief Complaint Information obtained from Patient Bilateral LE lymphedema with Left LE Ulcers History of Present Illness (HPI) The patient is a 66 year old female with history of hypertension and a long-standing history of bilateral lower extremity lymphedema (first presented on 4/2) . She has had open ulcers in the past which have always responded to compression therapy. She had briefly been to a lymphedema clinic in the past which helped her at the time. this time around she stopped treatment of her lymphedema pumps approximately 2 weeks ago because of some pain in the knees and then noticed the right leg getting worse. She was seen by her PCP who put her on clindamycin 4 times a day 2 days ago. The patient has seen [facility]  and Dr. Delana Meyer had seen her last year where a vascular study including venous and arterial duplex studies were within normal limits. he had recommended compression stockings and lymphedema pumps and the patient has been using this in about 2 weeks ago. She is known to be diabetic but in the past few time she's gone to her primary care doctor her hemoglobin A1c has been normal. 02/11/2015 - after her last visit she took my advice and went to the ER regarding the progressive cellulitis of  her right lower extremity and she was admitted between July 17 and 22nd. She received IV antibiotics and then was sent home on a course of steroid-induced and oral antibiotics. She has improved much since then. 02/17/2015 -- she has been doing fine and the weeping of her legs has remarkably gone down. She has no fresh issues. READMISSION 01/15/18 This patient was given this clinic before most recently in 2016 seen by Dr. Con Memos. She has massive bilateral lymphedema and over the last 2 months this had weeping edema out of the left leg. She has compression pumps but her compliance with these has been minimal. She has [facility] they've been using TCA/ABDs/kerlix under an Ace wrap.she has had recent problems with cellulitis. She was apparently seen in the ER and 12/23/17 and given clindamycin. She was then followed by her primary doctor and given doxycycline and Keflex. The pain seems to have settled down. In April 2018 the patient had arterial studies done at [facility]. This showed triphasic waveforms throughout the right leg and mostly triphasic waveforms on the left except for monophasic at the posterior tibial artery distally. She was not felt to have evidence of right lower extremity arterial stenosis or significant problems on the left side. 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 She also had a right lower extremity venous Doppler in January 2018 this was limited by the patient's body habitus and lymphedema. Most of the proximal veins were not visualized The patient presents with an area of denuded skin on the anterior medial part of the left calf. There is weeping edema fluid here. 01/22/18; the patient has somewhat better edema control using her compression pumps twice a day and as a result she has much better epithelialization on the left anterior calf area. Only a small open area remains. 01/29/18; the patient has been compliant with her  compression pumps. Both the areas on her calf that healed. The remaining area on the left anterior leg is fully epithelialized Readmission: 02/20/2019 upon evaluation today patient presents for reevaluation due to issues that she is having with the bilateral lower extremities. She actually has wounds open on both legs. On the right she has an area in the crease of her leg on the right around the knee region which is actually draining quite a bit and actually has some fungal type appearance to it. She has been on nystatin powder that seems to have helped to some degree. In regard to the left lower extremity this is actually in the lower portion of her leg closer to the ankle and again is continuing to drain as well unfortunately. There does not appear to be any signs of active infection at this time which is good news. No fevers, chills, nausea, vomiting, or diarrhea. She tells me that since she was seen last year she is actually been doing quite well for the most part with regard to her lower extremities. Unfortunately she now is experiencing  a little bit more drainage at this time. She is concerned about getting this under control so that it does not get significantly worse. 02/27/2019 on evaluation today patient appears to be doing somewhat better in regard to her bilateral lower extremity wounds. She has been tolerating the dressing changes without complication. Fortunately there is no signs of active infection at this point. No fevers, chills, nausea, vomiting, or diarrhea. She did get her dressing supplies which is excellent news she was extremely excited to get these. She also got paperwork from [employer] for their financial assistance program where they may be able to help her out in the future if needed with supplies at discounted prices. 03/06/2019 on evaluation today patient appears to be doing a little worse with regard to both areas of weeping on her bilateral lower extremities. This is around  the right medial knee and just above the left ankle. With that being said she is unfortunately not doing as well as I would like to see. I feel like she may need to potentially go see someone at the lymphedema clinic as the wraps that she needs or even beyond what we can do here at the wound care center. She really does not have wounds she just has open areas of weeping that are causing some difficulty for her. Subsequently because of this and the moisture I am concerned about the potential for infection I am going to likely give her a prophylactic antibiotic today, Keflex, just to be on the safe side. Nonetheless again there is no obvious signs of active infection at this time. KEYANAH, KOZICKI ([id number]) 03/13/2019 on evaluation today patient appears to be doing well with regard to her bilateral lower extremities where she has been weeping compared to even last week's evaluation. I see some areas of new skin growth which is excellent and overall I am very pleased with how things seem to be progressing. No fevers, chills, nausea, vomiting, or diarrhea. 03/20/2019 on evaluation today patient unfortunately is continuing to have issues with significant edema of the left lower extremity. Her right side seems to be doing much better. Unfortunately her left side is showing increased weeping of the lower portion of her leg. This is quite unfortunate obviously we were hoping to get her into the lymphedema clinic they really do not seem to when I see her how if she is draining. Despite the fact this is really not wound related but more lymphedema weeping related. Nonetheless I do not know that this can be helpful for her to even go for that appointment since again I am not sure there is much that they would actually do at this point. We may need to try a 4 layer compression wrap as best we can on her leg. She is on the Augmentin currently although I am still concerned about whether or not there could be  potentially something going on infection wise I would obtain a culture though I understand is not the best being that is a surface culture I just 1 to make sure I do not seem to be missing anything. 03/27/2019 on evaluation today patient appears to be doing much better in regard to the left lower extremity compared to last week. Last week she had tremendous weeping which I think was subsequent to infection now she seems to be doing much better and very pleased. This is not completely healed but there is a lot of new skin growth and it has dried out quite a bit.  Overall I think that we are doing well with how things are moving along at this time. No fevers, chills, nausea, vomiting, or diarrhea. 04/03/2019 evaluation today patient appears to be doing a little worse this week compared to last time I saw her. I think this may be due to the fact that she is having issues with not being able to sleep in her bed at least not until last night. She is therefore been in a lift chair and subsequently has also had issues with not been able to use her pumps since she could not get in bed. With that being said the patient overall seems to be doing okay I do think I may want extend the antibiotic for a little bit longer at least until we can see if her edema and her weeping gets better and if it is then obviously I can always discontinue the antibiotics as of next week however I want her to continue to have it over the next week. 04/10/2019 on evaluation today patient unfortunately is still doing poorly with regard to her left lower extremity. Her right is all things considering doing fairly well. On the left however she continues to have spreading of the area of infection and weeping which appears to be even a larger surface area than noted last week. She did have a positive culture for Pseudomonas in particular which seems to have been of concern she still has green/yellow discharge consistent with Pseudomonas and  subsequently a tremendous amount of it. This has me obviously still concerned about the infection not really clearing up despite the fact that on culture it appears the Cipro should have been a good option for treating this. I think she may at this point need IV antibiotics since things are not doing better I do not want to get worse and cause sepsis. She is in agreement with the plan and believes as well that she likely does need to go to the hospital for IV vancomycin. Or something of the like depending on what the recommendation is from the ER. 04/17/2019 on evaluation today patient appears to be doing excellent in regard to her lower extremity on the left. She was in the hospital for several days from when I sent her last we saw her until just this past Tuesday. Fortunately her drainage is significantly improved and in fact is mostly clear. There is just a couple small areas that may still drain a little bit she states that the [facility] they prescribed for her at discharge she went picked up from pharmacy and got home but has not been able to find it since. She is looked everywhere. She is wondering if I will replace that for her today I will be more than happy to do that. 05/01/2019 on evaluation today patient actually appears to be doing quite well with regard to her lower extremities. She occasionally is having areas that will leak and then heal up mainly when a piece of the fibrotic skin pops off but fortunately she is not having any signs of active infection at this time. Overall she also really does not have any obvious weeping at this time. I do believe however she really needs some compression wraps and I think this may be a good time to get her back to the lymphedema clinic. 05/11/2019 on evaluation today patient actually appears to be doing quite well with regard to her bilateral lower extremities. She occasionally will have a small area that we per another but  in general seems to be  completely healed which is great news. Overall very pleased with how everything seems to be progressing. She does have her appointment with lymphedema clinic on November 18. 05/25/2019 on evaluation today patient appears to be doing well with regard to her left lower extremity. I am very pleased in this regard. In regard to her right leg this actually did start draining more I think it is mainly due to the fact that her leg is more swollen. I am not seeing any obvious signs of infection at this time although that is definitely something were obviously acutely aware of simply due to the fact that she had an issue not too far back with exactly this issue. Nonetheless I do feel like that lymphedema clinic would still be beneficial for her. I explained obviously if they are not able to do anything treatment wise on the right leg we could at least have them treat her left leg and then proceed from there. The patient is really in agreement with that plan. If they are able to do both as the drainage slows down that I would be happy to let them handle both. 06/01/2019 on evaluation today patient unfortunately appears to be doing worse with regard to her right lower extremity. The left lower extremity is still maintaining at this point. Unfortunately she has been having significantly increased pain over the past several days and has been experiencing as well increased swelling of the right lower extremity. I really do not know that I am seeing anything that appears to be obvious for infection at this point to be peripherally honest. With that being said the patient does seem to be having much more swelling that she is even experienced in the past and coupled with increased pain in her hip as well I am concerned that again she could potentially have a DVT although I am not 100% sure of this. I think it something that may need to be checked out. We discussed the possibility of sending her for a DVT study through  the hospital but unfortunately transportation is an issue if she does have a DVT I do not want her to wait days to be able to get in for that test however if she has this scheduled as an outpatient that is as fast that she will be able to get the test scheduled for transportation purposes. That will also fall on Thanksgiving so subsequently she did actually be looking at either Friday or even next week before we would know anything back from this. That is much too long in my opinion. Subsequent to the amount of discomfort she is experiencing the patient is actually okay with going to the ER for evaluation today. 06/12/2019 on evaluation today patient actually appears to be doing significantly better compared to last time I saw her. Following when I last saw her she was actually in the hospital from that Monday until the following Sunday almost 1 full week. She actually was placed on Keflex in the hospital following the time for her to be discharged and Dr. Steva Ready has recommended 2 times a day dosing of the Keflex for the next year in order to help with more prophylactic/preventative measures with regard to her developing cellulitis. Overall I think this sounds like an excellent plan. The patient unfortunately is good to have trouble being treated at lymphedema clinic due to the fact that she really cannot get up on the bed that they have there. They also  state that they cannot manage her as long as she has anything draining at this point. Obviously that is somewhat unfortunate as she does need help with edema control but nonetheless we will have to do what we can for her outside of it sounds like the lymphedema clinic scenario at this point. 06/19/2019 on evaluation today patient appears to be doing fairly well with regard to her bilateral lower extremities. She is not nearly as swollen [name], [name] ([id number]) and shows no signs of infection at this point. There is no evidence of cellulitis  whatsoever. She also has no open wounds or draining at this point which is also good news. No fever chills noted. She seems to be in very good spirits and in fact appears to be doing quite well. READMISSION 11/27/2019 This is a 66 year old woman that we have had in this clinic several times before including 2015, 16 and 19 and then most recently from 03/20/2019 through 06/19/2019 with bilateral lower extremity lymphedema. She has had previous arterial and reflux studies done years ago which were not all that remarkable. 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 She does have a positive family history and she had large legs starting may be in her 62s. She was recently in hospital from 10/20/2019 through 10/28/2019 with right leg cellulitis. She was given Ancef and clindamycin and then Zosyn when a culture showed Pseudomonas. At that time there was purulent drainage. She was followed by infectious disease Dr Steva Ready. The patient is now back at home. She has noted increased swelling in the right and no drainage in her right leg mostly on the posterior medial aspect in the calf area. She has not had pain or fever. She has literally been improved lysing above dressings because her at the area of this is far too large for standard compression. She has been wrapping the areas with sheets to resorptive pads. She is found these helped somewhat. She does have an appointment with the lymphedema clinic in [place] in late June. Past medical history includes bilateral lymphedema, hypertension, obstructive sleep apnea with CPAP. Recent hospitalization with apparently Pseudomonas cellulitis of the right lower leg 12/15/2019 upon evaluation today patient appears to be doing a little bit worse in regard to her right lower extremity. Unfortunately she is having more weeping down in the lower portion of her leg. Fortunately there is no signs of active infection at  this time. No fever chills noted. The patient states she is not having increased pain except for when she attempted to use the lymphedema pumps unfortunately she states that she did have pain when she did this. Otherwise we been using absorptive dressings of one type or another she is using diapers at home and then subsequently Ace wraps. In regard to the barrier cream we have discussed the possibility of derma cloud which she would like to try I do not have a problem with that. 12/22/2019 upon evaluation today patient actually appears to be doing better in regard to her leg ulcers at this point. Fortunately there does not appear to be any signs of active infection which is great news and I am extremely pleased with where things are progressing at this time. There is no sign of active infection currently. The patient is very pleased to see things doing so well. 12/29/2019 upon evaluation today patient appears to be doing a little bit better in regard to her weeping in general over her lower  extremities. She does have some signs of mild erythema little bit more than what I noted last week or rather last visit. Nonetheless I think that my threshold for switching her antibiotics from Keflex to something else is very low at this point considering that she has had such severe infections in the past that seem to come almost out of nowhere. There is a little erythema and warmth noted of the lower portion of her leg compared to the upper which also makes me want to go ahead and address things more rapidly at this point. Likely I would switch out the Keflex for something like Levaquin ideally. 7/16; patient with severe bilateral lymphedema. She has superficial wounds albeit almost circumferential now on the left lateral lower leg. This may be new from last time. Small area on the right anterior lower leg and then another area on the right medial lower leg and of pannus fold. She has been using various  absorptive garments. She states she is using her compression pumps once a day occasionally twice. Culture from her last visit here was negative 01/29/2020 on evaluation today patient appears to be doing excellent at this point in regard to her legs with regard to infection I see no signs of active infection at this point. She still does have unfortunately areas of weeping this is minimal on the right now her left is actually significantly worse although I do not think it is as bad as last week with Dr. Dellia Nims saw her. She has been trying to pump and elevate her legs is much as possible. She has previously been on the Keflex and in the past for prevention that seems to do fairly well and likely can extend that today. 02/04/2020 on evaluation today patient appears to be doing better in regard to her legs bilaterally. Fortunately there is no signs of active infection at this time which is great news and overall she has less weeping on the left compared to the right and there is several spots where she is pretty much sealed up with no draining regions. Overall very pleased in this regard. 02/19/2020 on evaluation today patient appears to be doing very well in regard to her wounds currently. Fortunately there is no evidence of active infection overall very pleased with where things stand. She is significantly improved in regard to her edema I am extremely pleased in this regard she tells me that the popping no longer hurts and in fact she actually looks forward to it. 03/04/2020 on evaluation today patient appears to be doing excellent in regard to her lower extremities. Fortunately there is no signs of active infection at this time. No fevers, chills, nausea, vomiting, or diarrhea. 03/25/2020 on evaluation today patient appears to be doing a little bit more poorly in regard to her legs at this point. She tells me that she is still continue to have issues with drainage and this has been a little bit worse she  was getting ready to start taking the Keflex again but wanted to see me first. Fortunately there is no signs of active infection at this time. No fevers, chills, nausea, vomiting, or diarrhea. 04/15/2020 upon evaluation today patient appears to be doing somewhat poorly in regard to her right leg. She tells me she has been having more pain she has been taking the Keflex that was previously prescribed unfortunately that just does not seem to help with this. She was hoping that the pain on her right was actually coming from the  fact that she was having issues with her wrap having gotten caught in her recliner. With that being said she tells me that she knew something was not right. Currently her right leg is warm to touch along with being erythematous all the way up to around at least mid thigh as far as I can see. The left leg does not appear to be doing that badly though there is increased weeping around the ankle region. 05/06/2020 on evaluation today patient appears to be doing much better than last time I saw her. She did go to the hospital where she was admitted for 2 days and treated with antibiotic therapy. She was discharged with antibiotics as well and has done extremely well. I am extremely pleased with where things stand today. There is no signs of active infection at this time which is great news. 05/27/2020 upon evaluation today patient appears to be doing well with regard to her lower extremities bilaterally. She has just a very tiny area on the right leg which is opening on the left leg she is significantly improved though she still has several areas that do appear to be open this is minimal compared to what is been in the past. In general I am extremely pleased with where things stand today. The patient does tell me she is not been using her pumps quite as much as she should be. I do believe that is 1 area she can definitely work on. She has had a lot going on including a Covid exposure  and apparently also a outbreak of likely shingles. [name], VANDERWEIDE ([id number]) 06/24/2020 upon evaluation today patient appears to be doing well with regard to her legs in general although the left leg unfortunately is showing some signs of erythema she does have a little bit of increased weeping and to be honest I am concerned about infection here. I discussed that with her today and I think that we may need to address this sooner rather than later she has been taking Keflex she is not really certain that is been making a big improvement however. No fevers, chills, nausea, vomiting, or diarrhea. 06/30/2020 upon evaluation today patient's legs actually seem to be doing better in my opinion as compared to where they were last week. Fortunately there does not appear to be any signs of active infection. Her culture showed multiple organisms nothing predominate. With that being said the Levaquin seems to have done well I think she has improved since I last saw her as well. 07/14/2020 upon evaluation today patient appears to be doing actually better in regard to her lower extremities in my opinion. She has been tolerating the dressing changes without complication. Fortunately there is no signs of active infection at this time. 08/04/2020 upon evaluation today patient appears to be doing excellent in regard to her leg ulcers. Fortunately she has very little that is open at this point. This is great news. In fact I think that the Goldbond medicated powder has been excellent for her. It seems to have done the trick where we had tried several other things without as much success. Fortunately there is no evidence of active infection at this time. No fevers, chills, nausea, vomiting, or diarrhea. 09/01/2020 upon evaluation today patient appears to be doing a little bit more poorly than the last time I saw her. She tells me right now that she has been having a lot of drainage compared to where things were previous  which has unfortunately led to  more irritation as well. She is concerned that this is leading to infection. Again based on what I am seeing today as well I am also concerned of the same to be honest. 09/15/2020 upon evaluation today patient appears to be doing well with regard to her legs compared to what I saw previous. Fortunately there does not appear to be any evidence of active infection at this time which is great news. At least not as badly as what it was previous. With that being said I do believe that the patient does need to have an extension of the antibiotics. This I believe will actually help her more in the way of making sure this stays under control and does not worsen. That was the Bactrim DS. Readmission: 08/01/2021 upon evaluation today patient appears to be doing actually pretty well all things considered. Is actually been almost a year since have seen her last March. Fortunately I do not see any evidence of active infection locally nor systemically at this point. She does have a couple open areas on the left leg the right leg appears to be doing quite well. In general she has been in the hospital 3 times since I saw her a year ago all 4 cellulitis type issues except for the last 1 which was actually more related to congestive heart failure for that reason she is not using her lymphedema pumps at this point and that is probably the best idea. 08/08/2021 upon evaluation today patient appears to be doing well with regard to her legs all things considered her left leg may be a little bit more swollen based on what I see today. Fortunately I do not see any signs of active infection locally nor systemically at this time which is great news. No fevers, chills, nausea, vomiting, or diarrhea. 08/15/2021 upon evaluation today patient actually appears to be doing excellent in regard to her wounds. She has been tolerating the dressing changes without complication. Fortunately I see no evidence of  infection currently which is great news. No fevers, chills, nausea, vomiting, or diarrhea. 08/29/2021 upon evaluation patient appears to be doing excellent she in fact is almost completely healed. I am actually very pleased with where we stand today and I think that she is making wonderful progress she just has a small area of weeping on the right lateral leg everything else is pretty much completely healed which is also. 09/05/2021 upon evaluation today patient appears to be doing well with regard to her legs. She does have a small area of weeping on the right leg and there is a small area on the left leg as well. It is potentially something that may need to be addressed in the future more specifically but right now I think that there may have just been a little drainage here on the left. With all that being said I think that this still does not appear to be infected and overall I feel like she is doing quite well. That something we always have to keep a very close eye on as she can change very rapidly from okay to not okay. 09/12/2021 upon evaluation today patient appears to be doing a little bit worse in regard to her leg and actually somewhat concerned about the possibility of infection here. I discussed that with the patient. For that reason I am going to go ahead and see about getting her set up for a repeat prescription for the Bactrim. She is in agreement with that plan. 3/14; patient  presents for follow-up. She has been using silver alginate with dressing changes. She is still taking Bactrim prescribed at last clinic visit. She has no issues or complaints today. She has lymphedema pumps however does not use these. 09/26/2020 upon evaluation today patient appears to actually be doing well in regard to her right leg I am pleased in that regard. Unfortunately she has new areas on her left leg that are open at this point that I do need to be addressed. I am also concerned about the warmth around one  of the wounds on the more anterior side. I think this is something that we may need to culture today potentially requiring antibiotics going forward. 10/03/2021 upon evaluation today patient unfortunately is not doing nearly as well as she was even last week with her wounds. She is having some issues here with increased cellulitis of the left lower extremity unfortunately. I did prescribe Augmentin for her eczema prescribed last week unfortunately she never actually received this prescription. The pharmacy stated they never got it. We double checked with them today they still did not receive it. I am not sure what happened in that regard. With that being said the patient is in good spirits today she does not appear to be as sick as where she normally is when the cellulitis gets significantly worse as it has in the past. Nonetheless the leg is much more red not just around the ankle where I was seeing at last week on Tuesday but rather now this is going all the way up to almost her knee and is definitely hot to touch compared to the right leg. Nonetheless I feel like she does need to go to the ER for likely IV antibiotics. 10-10-2021 upon evaluation today patient appears to be doing well with regard to her wound. She has been tolerating the dressing changes that appears to be doing much better. After I saw her last week she did go to the ER they admitted her to the hospital and gave her IV antibiotics she fortunately is doing significantly better. Overall I am extremely pleased with where things stand today. 10-17-2021 upon evaluation today patient appears to be doing well with regard to her wound. In fact this is looking better and her leg is looking better but she still does have some areas here of erythema. We will continue to address this as soon as possible in my opinion. I do believe she may require some debridement and what appears to be a more defined wound on her left leg at this point. 10-24-2021  upon evaluation patient is definitely showing signs of improvement which is great news. I do not see any evidence of active infection Rhee, Hutton. ([id number]) locally or systemically which is great news as well. No fevers, chills, nausea, vomiting, or diarrhea. 10-31-2021. Upon evaluation today patient's leg actually feels better from an infection and wound care perspective. I am actually very pleased in this regard. Unfortunately the biggest issue that I see at this time is that the patient is having a significant issue with her breathing. I did put her on the pulse ox machine to check her oxygen saturation and it was pretty much at 100% which is good but she tells me that when she is walking this is much more difficult for her. She also tells me that when she is trying to bend over to perform her dressing changes she is so short of breath due to the swelling and fluid in her  abdominal area that she is just not been able to do this. She is tearful and very concerned today to be honest. I am truly sorry to see her this way I know she is really having a hard time at the moment. 11-14-2021 upon evaluation patient actually appears to be doing significantly better compared to when I last saw her. She was actually admitted to the hospital I believe this for 6 days total after I sent her on 10-31-2021. Subsequently they actually pulled off greater than 50 pounds of fluid she tells me she feels significantly better her legs are not as tight she actually has some play in the tissue and it is obvious that she is doing much better just from a mobility standpoint as well as a breathing standpoint. Overall I am extremely happy for her and how she is doing the leg also looks much better on the left. 11-21-2021 upon evaluation today patient appears to be doing well although it does appear that she is going require some sharp debridement the wound is very dry this is the opposite of what its been she was having so  much weeping that it was staying extremely wet. Nonetheless we do need to see about going ahead and debriding the wound today. 11-28-2021 upon evaluation today patient appears to be doing well with regard to her wound I definitely see signs of things continuing to improve which is great news. I do not see any evidence of active infection locally or systemically also great news. 12-05-2021 upon evaluation today patient appears to be doing well with regard to her wound. She has been tolerating the dressing changes. Fortunately there does not appear to be any signs of active infection locally or systemically at this time. No fevers, chills, nausea, vomiting, or diarrhea. 12-12-2021 upon evaluation patient appears to be doing well with regard to her wound this is actually measuring smaller and looking much better. Fortunately I do not see any signs of active infection locally or systemically at this time which is excellent news. 6/13; small wound area in the folds of her lymphedema just above the left ankle. Fortunately the area looks quite good. She has been using silver alginate ABDs and an Ace wrap which she is able to change her self. She is not using her compression pumps out of fear that this could contribute to heart failure. 12-26-2021 upon evaluation patient's wounds are actually showing signs of excellent improvement. I am very pleased with where things stand. Overall I think that she has been making excellent progress and I think we are very close to complete resolution which is great news. 01-11-2022 upon evaluation today patient appears to be doing well currently in regard to her legs in general although on the left leg she does have 1 area that still irritated and inflamed on the anterior portion of her leg. Fortunately I do not see any signs of systemic infection locally there might be some infection going on here however which is my biggest concern. Fortunately I do not see any evidence though  of this spreading to any other location. 01-16-2022 upon evaluation today patient has actually showing signs of excellent improvement. Fortunately I do not see any evidence of infection locally or systemically which is great news and overall I am very pleased with where we stand at this point. 01-25-2022 upon evaluation today patient actually appears to be doing decently well in regard to her legs. She has been tolerating the dressing changes without complication. Fortunately  there does not appear to be any evidence of active infection locally or systemically which is great news and overall I am extremely pleased with where we stand at this point. Objective Constitutional Obese and well-hydrated in no acute distress. Vitals Time Taken: 8:52 AM, Height: 63 in, Weight: 372 lbs, BMI: 65.9, Temperature: 98.3 F, Pulse: 82 bpm, Respiratory Rate: 18 breaths/min, Blood Pressure: 100/69 mmHg. Respiratory normal breathing without difficulty. Psychiatric this patient is able to make decisions and demonstrates good insight into disease process. Alert and Oriented x 3. pleasant and cooperative. General Notes: Patient's wound bed showed evidence of excellent healing she is very close to complete closure she does have a few blisters down further around the ankle. Subsequently I think this is due to likely just a little irritation from the Ace wrap which has been kind of sliding around on her. Nonetheless I do believe that she needs to watch this area and subsequently we will see how things go. She has not used her pumps yet but tells me that she is planning to try over the weekend. She has been a little reluctant to do so because of the congestive heart failure. Although her numbers are now better and she does see the heart failure clinic when she leaves here today. CAMYRA, VAETH ([id number]) Other Condition(s) Patient presents with Lymphedema located on the Right Leg. General Notes: no drainage  noted Patient presents with Lymphedema located on the Left Leg. 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 Nurse Visit as needed Bathing/ Shower/ Hygiene: Clean wound with Normal Saline or wound cleanser. Wash wounds with antibacterial soap and water. May shower; gently cleanse wound with antibacterial soap, rinse and pat dry prior to dressing wounds No tub bath. Edema Control - Lymphedema / Segmental Compressive Device / Other: Ace wraps Elevate, Exercise Daily and Avoid Standing for Long Periods of Time. Elevate leg(s) parallel to the floor when sitting. DO YOUR BEST to sleep in the bed at night. DO NOT sleep in your recliner. Long hours of sitting in a recliner leads to swelling of the legs and/or potential wounds on your backside. Other: - use lymphedema pumps Non-Wound Condition: Cleanse affected area with antibacterial soap and water, Apply appropriate compression. - ace wrap Additional non-wound orders/instructions: - silver cell to open weeping areas with abd pad Medications-Please add to medication list.: P.O. Antibiotics - continue Doxycycline 1. Would recommend currently that we going continue with the wound care measures as before and the patient is in agreement with plan this includes the use of the silver alginate to the open areas. 2. Also can continue with the clean with antibacterial soap which I think is doing quite well. 3. She is also getting continue with the absorptive pads followed by roll gauze and Ace wrap to secure in place. 4. The patient tells me she is going to try to get into using her pumps 1 time per day she feels like that will probably be okay at this point  since her numbers from a congestive heart failure standpoint are much better. We will see patient back for reevaluation in 1 week here in the clinic. If anything worsens or changes patient will contact our office for additional recommendations. Electronic Signature(s) Signed: 01/25/2022 9:23:24 AM  By: Worthy Keeler PA-C Entered By: Worthy Keeler on 01/25/2022 09:23:24 [name] ([id number]) -------------------------------------------------------------------------------- SuperBill Details Patient Name: [name]. Date of Service: 01/25/2022 Medical Record Number: [id number] Patient Account Number: [id number] Date of Birth/Sex: 10-10-1955 (66 y.o. F) Treating RN: [name] Dredge Primary Care Provider: Ranae Plumber Other Clinician: Massie Kluver Referring Provider: Ranae Plumber Treating Provider/Extender: Skipper Cliche in Treatment: 25 Diagnosis Coding ICD-10 Codes Code Description Q82.0 Hereditary lymphedema L97.811 Non-pressure chronic ulcer of other part of right lower leg limited to breakdown of skin L97.821 Non-pressure chronic ulcer of other part of left lower leg limited to breakdown of skin Facility Procedures CPT4 Code: 00712197 Description: 99213 - WOUND CARE VISIT-LEV 3 EST PT Modifier: Quantity: 1 Physician Procedures CPT4 Code: 5883254 Description: 98264 - WC PHYS LEVEL 3 - EST PT Modifier: Quantity: 1 CPT4 Code: Description: ICD-10 Diagnosis Description Q82.0 Hereditary lymphedema L97.811 Non-pressure chronic ulcer of other part of right lower leg limited to bre L97.821 Non-pressure chronic ulcer of other part of left lower leg limited to brea Modifier: akdown of skin kdown of skin Quantity: Electronic Signature(s) Signed: 01/25/2022 9:23:40 AM By: Worthy Keeler PA-C Entered By: Worthy Keeler on 01/25/2022 09:23:40

## 2022-01-25 NOTE — Progress Notes (Signed)
[name] ID: [name], female    DOB: [birth year], 66 y.o.   MRN: [id number]  HPI  [name] is a 66 y/o female with a history of asthma, HTN, arthritis, lymphedema, sleep apnea, spinal stenosis, obesity, previous tobacco use and chronic heart failure.   Echo report from 11/01/21 reviewed and showed an EF of 60-65%.   Admitted 10/31/21 due to worsening shortness of breath, edema and orthopnea. Initially given IV lasix with transition to oral diuretics with addition of twice weekly metolazone. Cardiology consult obtained. Discharged after 6 days.   [name] presents today for a follow-up visit with a chief complaint of moderate fatigue with minimal exertion. Describes this as chronic in nature. [name] has associated cough, shortness of breath, lymphedema, anxiety and chronic difficulty sleeping with this. [name] denies any dizziness, abdominal distention, palpitations or chest pain.   Occasionally takes '40mg'$  lasix instead of '20mg'$  If [name] takes Elizabeth '40mg'$ , [name] will take an additional 57mq potassium. Does not take any additional potassium with metolazone.   Past Medical History:  Diagnosis Date   Arthritis    Asthma    [place]    Enlarged heart    Hypertension    Lymphedema    Sleep apnea    Spinal stenosis    Past Surgical History:  Procedure Laterality Date   COLONOSCOPY WITH PROPOFOL N/A 03/12/2018   Procedure: COLONOSCOPY WITH PROPOFOL;  Surgeon: EManya Silvas MD;  [facility];  Service: Endoscopy;  Laterality: N/A;   NO PAST SURGERIES     Family History  Problem Relation Age of Onset   Thyroid disease Other    Breast cancer Maternal Aunt    Social History   Tobacco Use   Smoking status: Former    Packs/day: 0.25    Types: Cigarettes    Start date: 07/09/1976    Quit date: 07/09/1990    Years since quitting: 31.5   Smokeless tobacco: Current    Types: Snuff  Substance Use Topics   Alcohol use: No   Allergies  Allergen Reactions    Ace Inhibitors Hives and Swelling   Ibuprofen Hives and Swelling   Vancomycin Itching and Nausea And Vomiting   Prior to Admission medications   Medication Sig Start Date End Date Taking? Authorizing Provider  acetaminophen (TYLENOL) 500 MG tablet Take 500-1,000 mg by mouth every 6 (six) hours as needed for mild pain or fever.   Yes [provider]  atenolol (TENORMIN) 25 MG tablet Take 1 tablet (25 mg total) by mouth daily. 11/06/21 02/04/22 Yes LEnzo Bi MD  cetirizine (ZYRTEC) 10 MG tablet Take 1 tablet (10 mg total) by mouth daily. 12/25/20  Yes PLavina Hamman MD  doxycycline (VIBRAMYCIN) 100 MG capsule Take 100 mg by mouth 2 (two) times daily. 01/11/22  Yes [provider]  furosemide (LASIX) 40 MG tablet '40mg'$  QOD alternating with '20mg'$  QOD 01/11/22  Yes Jacarra Bobak A, FNP  metolazone (ZAROXOLYN) 2.5 MG tablet Take 1 tab every Tue and Sat 30 minutes prior Lasix. 11/06/21  Yes LEnzo Bi MD  potassium chloride SA (KLOR-CON M) 20 MEQ tablet Take 2 tablets (40 mEq total) by mouth daily. 11/28/21  Yes Catheline Hixon A, FNP  senna-docusate (SENOKOT-S) 8.6-50 MG tablet Take 1 tablet by mouth 2 (two) times daily.   Yes [provider]    Review of Systems  Constitutional:  Positive for fatigue. Negative for appetite change.  HENT:  Negative for congestion, postnasal drip  and sore throat.   Eyes: Negative.   Respiratory:  Positive for cough and shortness of breath. Negative for chest tightness.   Cardiovascular:  Positive for leg swelling. Negative for chest pain and palpitations.  Gastrointestinal:  Negative for abdominal distention, abdominal pain, constipation and diarrhea.  Endocrine: Negative.   Genitourinary: Negative.   Musculoskeletal:  Negative for back pain.  Skin: Negative.        Severe lymphedema  Allergic/Immunologic: Negative.   Neurological:  Negative for dizziness and light-headedness.  Hematological:  Negative for adenopathy. Does not bruise/bleed  easily.  Psychiatric/Behavioral:  Positive for sleep disturbance (chronic trouble falling asleep; sleeps in recliner). Negative for dysphoric mood. [name] is nervous/anxious.    Vitals:   01/25/22 1032  BP: (!) 141/96  Pulse: 93  Resp: 18  SpO2: 95%  Weight: (!) 389 lb 6 oz (176.6 kg)  Height: '5\' 3"'$  (1.6 m)   Wt Readings from Last 3 Encounters:  01/25/22 (!) 389 lb 6 oz (176.6 kg)  01/11/22 (!) 390 lb (176.9 kg)  01/03/22 (!) 390 lb (176.9 kg)   Lab Results  Component Value Date   CREATININE 1.23 (H) 01/11/2022   CREATININE 1.69 (H) 01/03/2022   CREATININE 1.46 (H) 12/12/2021   Physical Exam Vitals and nursing note reviewed.  Constitutional:      Appearance: [name] is well-developed.  HENT:     Head: Normocephalic and atraumatic.  Cardiovascular:     Rate and Rhythm: Normal rate and regular rhythm.  Pulmonary:     Effort: Pulmonary effort is normal. No accessory muscle usage.     Breath sounds: No wheezing, rhonchi or rales.  Abdominal:     Palpations: Abdomen is soft.     Tenderness: There is no abdominal tenderness.  Musculoskeletal:     Cervical back: Normal range of motion and neck supple.     Right lower leg: Edema present.     Left lower leg: Edema present.     Comments: Severe lymphedema  Skin:    General: Skin is warm and dry.  Neurological:     General: No focal deficit present.     Mental Status: [name] is alert and oriented to person, place, and time.  Psychiatric:        Mood and Affect: Mood is not depressed.        Behavior: Behavior normal.   Assessment & Plan:  1: Chronic heart failure with preserved ejection fraction without structural changes- - NYHA class III - euvolemic today - attempting to weigh daily with standing on 2 scales but it's been fluctuating quite a bit; [name]'s going to look into finding a wide enough scale for her to stand on - weight stable from last visit here 2 weeks ago - check BMP today since has used furoscix; waiting on  clarification from [facility] about whether it is approved or not - limiting her sodium intake but occasionally adds "a pinch" of salt to meats; does not like [name], garlic powder etc - understands to keep daily fluid intake to 60-64 ounces/ day - facial/mouth swelling with ACEi - saw cardiology Margarito Courser) 11/15/21 - BNP 10/31/21 was 123.9  2: HTN with stage 3 CKD- - BP elevated (141/96) but [name] says that [name] got scared in Elizabeth elevator as [name] thought it was going to get stuck - sees PCP Daryel Gerald) @ [facility] and [name] has to call and schedule an appointment - nephrology referral had been made but their office said they didn't  receive Elizabeth referral so another one was placed today - BMP 01/03/22 reviewed and showed sodium 139, potassium 4.1, creatinine 1.69 and GFR 33  3: Lymphedema- - went to wound center earlier today - [name] feels like lymphedema may be a little better  4: Sleep apnea- - wearing CPAP nightly   Medication list reviewed.  Return in 1 month, sooner if needed.

## 2022-01-25 NOTE — Telephone Encounter (Signed)
Spoke with patient regarding BMP results obtained earlier today. Potassium level is normal and renal function has declined slightly from previous results although is within her range. No change to her medications at this time. Patient verbalized understanding.

## 2022-01-26 NOTE — Progress Notes (Signed)
Elizabeth, [name] ([id number]) Visit Report for 01/25/2022 Arrival Information Details Patient Name: [name], [name]. Date of Service: 01/25/2022 9:00 AM Medical Record Number: [id number] Patient Account Number: [id number] Date of Birth/Sex: 04-04-1956 (66 y.o. F) Treating RN: [name] Dredge Primary Care Tyrel Lex: Ranae Plumber Other Clinician: Massie Kluver Referring Nelson Noone: Ranae Plumber Treating Johnisha Louks/Extender: Skipper Cliche in Treatment: 25 Visit Information History Since Last Visit All ordered tests and consults were completed: No Patient Arrived: [name] Added or deleted any medications: No Arrival Time: 08:49 Any new allergies or adverse reactions: No Transfer Assistance: None Had a fall or experienced change in No Patient Has Alerts: Yes activities of daily living that may affect Patient Alerts: Borderline Diabetic risk of falls: Hospitalized since last visit: No Pain Present Now: No Electronic Signature(s) Signed: 01/26/2022 4:34:39 PM By: Massie Kluver Entered By: Massie Kluver on 01/25/2022 08:51:54 Rogus, [name] ([id number]) -------------------------------------------------------------------------------- Clinic Level of Care Assessment Details Patient Name: [name]. Date of Service: 01/25/2022 9:00 AM Medical Record Number: [id number] Patient Account Number: [id number] Date of Birth/Sex: 11-03-1955 (66 y.o. F) Treating RN: [name] Dredge Primary Care Luticia Tadros: Ranae Plumber Other Clinician: Massie Kluver Referring Paulla Mcclaskey: Ranae Plumber Treating Timarie Labell/Extender: Skipper Cliche in Treatment: 25 Clinic Level of Care Assessment Items TOOL 4 Quantity Score '[]'$  - Use when only an EandM is performed on FOLLOW-UP visit 0 ASSESSMENTS - Nursing Assessment / Reassessment X - Reassessment of Co-morbidities (includes updates in patient status) 1 10 X- 1 5 Reassessment of Adherence to Treatment Plan ASSESSMENTS - Wound and Skin Assessment  / Reassessment '[]'$  - Simple Wound Assessment / Reassessment - one wound 0 '[]'$  - 0 Complex Wound Assessment / Reassessment - multiple wounds X- 1 10 Dermatologic / Skin Assessment (not related to wound area) ASSESSMENTS - Focused Assessment '[]'$  - Circumferential Edema Measurements - multi extremities 0 '[]'$  - 0 Nutritional Assessment / Counseling / Intervention '[]'$  - 0 Lower Extremity Assessment (monofilament, tuning fork, pulses) '[]'$  - 0 Peripheral Arterial Disease Assessment (using hand held doppler) ASSESSMENTS - Ostomy and/or Continence Assessment and Care '[]'$  - Incontinence Assessment and Management 0 '[]'$  - 0 Ostomy Care Assessment and Management (repouching, etc.) PROCESS - Coordination of Care X - Simple Patient / Family Education for ongoing care 1 15 '[]'$  - 0 Complex (extensive) Patient / Family Education for ongoing care '[]'$  - 0 Staff obtains Programmer, systems, Records, Test Results / Process Orders '[]'$  - 0 Staff telephones HHA, Nursing Homes / Clarify orders / etc '[]'$  - 0 Routine Transfer to another Facility (non-emergent condition) '[]'$  - 0 Routine Hospital Admission (non-emergent condition) '[]'$  - 0 New Admissions / Biomedical engineer / Ordering NPWT, Apligraf, etc. '[]'$  - 0 Emergency Hospital Admission (emergent condition) X- 1 10 Simple Discharge Coordination '[]'$  - 0 Complex (extensive) Discharge Coordination PROCESS - Special Needs '[]'$  - Pediatric / Minor Patient Management 0 '[]'$  - 0 Isolation Patient Management '[]'$  - 0 Hearing / Language / Visual special needs '[]'$  - 0 Assessment of Community assistance (transportation, D/C planning, etc.) '[]'$  - 0 Additional assistance / Altered mentation '[]'$  - 0 Support Surface(s) Assessment (bed, cushion, seat, etc.) INTERVENTIONS - Wound Cleansing / Measurement Glazier, Chasty C. ([id number]) '[]'$  - 0 Simple Wound Cleansing - one wound '[]'$  - 0 Complex Wound Cleansing - multiple wounds '[]'$  - 0 Wound Imaging (photographs - any number of  wounds) '[]'$  - 0 Wound Tracing (instead of photographs) '[]'$  - 0 Simple Wound Measurement - one wound '[]'$  - 0 Complex  Wound Measurement - multiple wounds INTERVENTIONS - Wound Dressings '[]'$  - Small Wound Dressing one or multiple wounds 0 X- 2 15 Medium Wound Dressing one or multiple wounds '[]'$  - 0 Large Wound Dressing one or multiple wounds '[]'$  - 0 Application of Medications - topical '[]'$  - 0 Application of Medications - injection INTERVENTIONS - Miscellaneous '[]'$  - External ear exam 0 '[]'$  - 0 Specimen Collection (cultures, biopsies, blood, body fluids, etc.) '[]'$  - 0 Specimen(s) / Culture(s) sent or taken to Lab for analysis '[]'$  - 0 Patient Transfer (multiple staff / Civil Service fast streamer / Similar devices) '[]'$  - 0 Simple Staple / Suture removal (25 or less) '[]'$  - 0 Complex Staple / Suture removal (26 or more) '[]'$  - 0 Hypo / Hyperglycemic Management (close monitor of Blood Glucose) '[]'$  - 0 Ankle / Brachial Index (ABI) - do not check if billed separately X- 1 5 Vital Signs Has the patient been seen at the hospital within the last three years: Yes Total Score: 85 Level Of Care: New/Established - Level 3 Electronic Signature(s) Signed: 01/26/2022 4:34:39 PM By: Massie Kluver Entered By: Massie Kluver on 01/25/2022 09:15:06 Balster, [name] ([id number]) -------------------------------------------------------------------------------- Encounter Discharge Information Details Patient Name: [name]. Date of Service: 01/25/2022 9:00 AM Medical Record Number: [id number] Patient Account Number: [id number] Date of Birth/Sex: 09/21/1955 (66 y.o. F) Treating RN: [name] Dredge Primary Care Mansa Willers: Ranae Plumber Other Clinician: Massie Kluver Referring Madgie Dhaliwal: Ranae Plumber Treating Shakyla Nolley/Extender: Skipper Cliche in Treatment: 25 Encounter Discharge Information Items Discharge Condition: Stable Ambulatory Status: Cane Discharge Destination: Home Transportation: Private  Auto Accompanied By: self Schedule Follow-up Appointment: Yes Clinical Summary of Care: Electronic Signature(s) Signed: 01/26/2022 4:34:39 PM By: Massie Kluver Entered By: Massie Kluver on 01/25/2022 09:39:29 Favaro, [name] ([id number]) -------------------------------------------------------------------------------- Multi Wound Chart Details Patient Name: [name]. Date of Service: 01/25/2022 9:00 AM Medical Record Number: [id number] Patient Account Number: [id number] Date of Birth/Sex: 02-17-1956 (66 y.o. F) Treating RN: [name] Dredge Primary Care Kyaire Gruenewald: Ranae Plumber Other Clinician: Massie Kluver Referring Ziyon Soltau: Ranae Plumber Treating Aldwin Micalizzi/Extender: Skipper Cliche in Treatment: 25 Vital Signs Height(in): 63 Pulse(bpm): 82 Weight(lbs): 372 Blood Pressure(mmHg): 100/69 Body Mass Index(BMI): 65.9 Temperature(F): 98.3 Respiratory Rate(breaths/min): 18 Wound Assessments Treatment Notes Electronic Signature(s) Signed: 01/26/2022 4:34:39 PM By: Massie Kluver Entered By: Massie Kluver on 01/25/2022 09:10:11 [place], [name] ([id number]) -------------------------------------------------------------------------------- [place] Details Patient Name: [name]. Date of Service: 01/25/2022 9:00 AM Medical Record Number: [id number] Patient Account Number: [id number] Date of Birth/Sex: 1956-06-08 (66 y.o. F) Treating RN: [name] Dredge Primary Care Tamme Mozingo: Ranae Plumber Other Clinician: Massie Kluver Referring Hennessy Bartel: Ranae Plumber Treating Celeste Candelas/Extender: Skipper Cliche in Treatment: 25 Active Inactive Wound/Skin Impairment Nursing Diagnoses: Impaired tissue integrity Knowledge deficit related to ulceration/compromised skin integrity Goals: Ulcer/skin breakdown will have a volume reduction of 30% by week 4 Date Initiated: 08/01/2021 Target Resolution Date: 08/29/2021 Goal Status: Active Ulcer/skin  breakdown will have a volume reduction of 50% by week 8 Date Initiated: 08/01/2021 Target Resolution Date: 09/26/2021 Goal Status: Active Ulcer/skin breakdown will have a volume reduction of 80% by week 12 Date Initiated: 08/01/2021 Target Resolution Date: 10/24/2021 Goal Status: Active Ulcer/skin breakdown will heal within 14 weeks Date Initiated: 08/01/2021 Target Resolution Date: 11/07/2021 Goal Status: Active Interventions: Assess patient/caregiver ability to obtain necessary supplies Assess patient/caregiver ability to perform ulcer/skin care regimen upon admission and as needed Assess ulceration(s) every visit Provide education on ulcer and skin care Notes: Electronic  Signature(s) Signed: 01/25/2022 4:27:06 PM By: [name] Dredge Signed: 01/26/2022 4:34:39 PM By: Massie Kluver Entered By: Massie Kluver on 01/25/2022 09:07:50 Gershman, [name] ([id number]) -------------------------------------------------------------------------------- Non-Wound Condition Assessment Details Patient Name: INDIE, BOEHNE C. Date of Service: 01/25/2022 9:00 AM Medical Record Number: [id number] Patient Account Number: [id number] Date of Birth/Sex: 20-Jun-1956 (66 y.o. F) Treating RN: [name] Dredge Primary Care Jacoby Zanni: Ranae Plumber Other Clinician: Massie Kluver Referring Jozeph Persing: Ranae Plumber Treating Geena Weinhold/Extender: Skipper Cliche in Treatment: 25 Non-Wound Condition: Condition: Lymphedema Location: Leg Side: Right Notes: Right leg with decreased drainage this visit. Notes no drainage noted Electronic Signature(s) Signed: 01/25/2022 4:27:06 PM By: [name] Dredge Signed: 01/26/2022 4:34:39 PM By: Massie Kluver Entered By: Massie Kluver on 01/25/2022 09:07:24 Lebo, [name] ([id number]) -------------------------------------------------------------------------------- Non-Wound Condition Assessment Details Patient Name: ALVENIA, TREESE C. Date of Service: 01/25/2022 9:00  AM Medical Record Number: [id number] Patient Account Number: [id number] Date of Birth/Sex: [birth year] (66 y.o. F) Treating RN: [name] Dredge Primary Care Joseph Bias: Ranae Plumber Other Clinician: Massie Kluver Referring Auryn Paige: Ranae Plumber Treating Eziah Negro/Extender: Skipper Cliche in Treatment: 25 Non-Wound Condition: Condition: Lymphedema Location: Leg Side: Left Notes: decreased drainage this visit. Notes minimal drainage noted Electronic Signature(s) Signed: 01/25/2022 4:27:06 PM By: [name] Dredge Signed: 01/26/2022 4:34:39 PM By: Massie Kluver Entered By: Massie Kluver on 01/25/2022 09:07:28 Lei, [name] ([id number]) -------------------------------------------------------------------------------- Pain Assessment Details Patient Name: CARMIN, ALVIDREZ C. Date of Service: 01/25/2022 9:00 AM Medical Record Number: [id number] Patient Account Number: [id number] Date of Birth/Sex: [birth year] (66 y.o. F) Treating RN: [name] Dredge Primary Care Harleen Fineberg: Ranae Plumber Other Clinician: Massie Kluver Referring Amadou Katzenstein: Ranae Plumber Treating Hashim Eichhorst/Extender: Skipper Cliche in Treatment: 25 Active Problems Location of Pain Severity and Description of Pain Patient Has Paino No Site Locations Pain Management and Medication Current Pain Management: Electronic Signature(s) Signed: 01/25/2022 4:27:06 PM By: [name] Dredge Signed: 01/26/2022 4:34:39 PM By: Massie Kluver Entered By: Massie Kluver on 01/25/2022 08:56:14 Orosz, [name] ([id number]) -------------------------------------------------------------------------------- Patient/Caregiver Education Details Patient Name: [name] Date of Service: 01/25/2022 9:00 AM Medical Record Number: [id number] Patient Account Number: [id number] Date of Birth/Gender: Feb 07, 1956 (66 y.o. F) Treating RN: [name] Dredge Primary Care Physician: Ranae Plumber Other Clinician: Massie Kluver Referring  Physician: Ranae Plumber Treating Physician/Extender: Skipper Cliche in Treatment: 25 Education Assessment Education Provided To: Patient Education Topics Provided Wound/Skin Impairment: Handouts: Other: continue wound care as directed Methods: Explain/Verbal Responses: State content correctly Electronic Signature(s) Signed: 01/26/2022 4:34:39 PM By: Massie Kluver Entered By: Massie Kluver on 01/25/2022 09:15:41 Agne, [name] ([id number]) -------------------------------------------------------------------------------- Vitals Details Patient Name: [name]. Date of Service: 01/25/2022 9:00 AM Medical Record Number: [id number] Patient Account Number: [id number] Date of Birth/Sex: 1956/06/17 (66 y.o. F) Treating RN: [name] Dredge Primary Care Maleaha Hughett: Ranae Plumber Other Clinician: Massie Kluver Referring Keyia Moretto: Ranae Plumber Treating Iyari Hagner/Extender: Skipper Cliche in Treatment: 25 Vital Signs Time Taken: 08:52 Temperature (F): 98.3 Height (in): 63 Pulse (bpm): 82 Weight (lbs): 372 Respiratory Rate (breaths/min): 18 Body Mass Index (BMI): 65.9 Blood Pressure (mmHg): 100/69 Reference Range: 80 - 120 mg / dl Electronic Signature(s) Signed: 01/26/2022 4:34:39 PM By: Massie Kluver Entered By: Massie Kluver on 01/25/2022 08:55:41

## 2022-01-30 ENCOUNTER — Encounter: Payer: Medicare HMO | Admitting: Internal Medicine

## 2022-01-31 ENCOUNTER — Ambulatory Visit: Payer: Medicare HMO | Admitting: Internal Medicine

## 2022-02-06 ENCOUNTER — Ambulatory Visit: Payer: Medicare HMO | Admitting: Physician Assistant

## 2022-02-13 ENCOUNTER — Encounter: Payer: Medicare HMO | Attending: Physician Assistant | Admitting: Physician Assistant

## 2022-02-13 DIAGNOSIS — I509 Heart failure, unspecified: Secondary | ICD-10-CM | POA: Insufficient documentation

## 2022-02-13 DIAGNOSIS — L97811 Non-pressure chronic ulcer of other part of right lower leg limited to breakdown of skin: Secondary | ICD-10-CM | POA: Insufficient documentation

## 2022-02-13 DIAGNOSIS — I110 Hypertensive heart disease with heart failure: Secondary | ICD-10-CM | POA: Diagnosis not present

## 2022-02-13 DIAGNOSIS — L97822 Non-pressure chronic ulcer of other part of left lower leg with fat layer exposed: Secondary | ICD-10-CM | POA: Insufficient documentation

## 2022-02-13 DIAGNOSIS — Q820 Hereditary lymphedema: Secondary | ICD-10-CM | POA: Diagnosis not present

## 2022-02-13 DIAGNOSIS — E11622 Type 2 diabetes mellitus with other skin ulcer: Secondary | ICD-10-CM | POA: Insufficient documentation

## 2022-02-13 DIAGNOSIS — I890 Lymphedema, not elsewhere classified: Secondary | ICD-10-CM | POA: Diagnosis present

## 2022-02-13 NOTE — Progress Notes (Addendum)
[name], [name] ([id number]) Visit Report for 02/13/2022 Chief Complaint Document Details Patient Name: [name], [name]. Date of Service: 02/13/2022 9:00 AM Medical Record Number: [id number] Patient Account Number: [id number] Date of Birth/Sex: 12-28-1955 (66 y.o. F) Treating RN: Alycia Rossetti Primary Care Provider: Ranae Plumber Other Clinician: Massie Kluver Referring Provider: Ranae Plumber Treating Provider/Extender: Skipper Cliche in Treatment: 28 Information Obtained from: Patient Chief Complaint Bilateral LE lymphedema with Left LE Ulcers Electronic Signature(s) Signed: 02/13/2022 2:59:21 PM By: Alycia Rossetti Signed: 02/13/2022 3:58:52 PM By: Worthy Keeler PA-C Previous Signature: 02/13/2022 9:27:14 AM Version By: Worthy Keeler PA-C Entered By: Alycia Rossetti on 02/13/2022 09:54:14 [name] ([id number]) -------------------------------------------------------------------------------- HPI Details Patient Name: [name]. Date of Service: 02/13/2022 9:00 AM Medical Record Number: [id number] Patient Account Number: [id number] Date of Birth/Sex: Nov 01, 1955 (66 y.o. F) Treating RN: Alycia Rossetti Primary Care Provider: Ranae Plumber Other Clinician: Massie Kluver Referring Provider: Ranae Plumber Treating Provider/Extender: Skipper Cliche in Treatment: 28 History of Present Illness HPI Description: The patient is a 66 year old female with history of hypertension and a long-standing history of bilateral lower extremity lymphedema (first presented on 4/2) . She has had open ulcers in the past which have always responded to compression therapy. She had briefly been to a lymphedema clinic in the past which helped her at the time. this time around she stopped treatment of her lymphedema pumps approximately 2 weeks ago because of some pain in the knees and then noticed the right leg getting worse. She was seen by her PCP who put her on clindamycin 4 times a day 2 days  ago. The patient has seen [facility] and Dr. Delana Meyer had seen her last year where a vascular study including venous and arterial duplex studies were within normal limits. he had recommended compression stockings and lymphedema pumps and the patient has been using this in about 2 weeks ago. She is known to be diabetic but in the past few time she's gone to her primary care doctor her hemoglobin A1c has been normal. 02/11/2015 - after her last visit she took my advice and went to the ER regarding the progressive cellulitis of her right lower extremity and she was admitted between July 17 and 22nd. She received IV antibiotics and then was sent home on a course of steroid-induced and oral antibiotics. She has improved much since then. 02/17/2015 -- she has been doing fine and the weeping of her legs has remarkably gone down. She has no fresh issues. READMISSION 01/15/18 This patient was given this clinic before most recently in 2016 seen by Dr. Con Memos. She has massive bilateral lymphedema and over the last 2 months this had weeping edema out of the left leg. She has compression pumps but her compliance with these has been minimal. She has [facility] they've been using TCA/ABDs/kerlix under an Ace wrap.she has had recent problems with cellulitis. She was apparently seen in the ER and 12/23/17 and given clindamycin. She was then followed by her primary doctor and given doxycycline and Keflex. The pain seems to have settled down. In April 2018 the patient had arterial studies done at [facility]. This showed triphasic waveforms throughout the right leg and mostly triphasic waveforms on the left except for monophasic at the posterior tibial artery distally. She was not felt to have evidence of right lower extremity arterial stenosis or significant problems on the left side. 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 She also had a right lower extremity venous  Doppler in January 2018 this was limited by the patient's body habitus and lymphedema. Most of the proximal veins were not visualized The patient presents with an area of denuded skin on the anterior medial part of the left calf. There is weeping edema fluid here. 01/22/18; the patient has somewhat better edema control using her compression pumps twice a day and as a result she has much better epithelialization on the left anterior calf area. Only a small open area remains. 01/29/18; the patient has been compliant with her compression pumps. Both the areas on her calf that healed. The remaining area on the left anterior leg is fully epithelialized Readmission: 02/20/2019 upon evaluation today patient presents for reevaluation due to issues that she is having with the bilateral lower extremities. She actually has wounds open on both legs. On the right she has an area in the crease of her leg on the right around the knee region which is actually draining quite a bit and actually has some fungal type appearance to it. She has been on nystatin powder that seems to have helped to some degree. In regard to the left lower extremity this is actually in the lower portion of her leg closer to the ankle and again is continuing to drain as well unfortunately. There does not appear to be any signs of active infection at this time which is good news. No fevers, chills, nausea, vomiting, or diarrhea. She tells me that since she was seen last year she is actually been doing quite well for the most part with regard to her lower extremities. Unfortunately she now is experiencing a little bit more drainage at this time. She is concerned about getting this under control so that it does not get significantly worse. 02/27/2019 on evaluation today patient appears to be doing somewhat better in regard to her bilateral lower extremity wounds. She has been tolerating the dressing changes without complication. Fortunately there  is no signs of active infection at this point. No fevers, chills, nausea, vomiting, or diarrhea. She did get her dressing supplies which is excellent news she was extremely excited to get these. She also got paperwork from [employer] for their financial assistance program where they may be able to help her out in the future if needed with supplies at discounted prices. 03/06/2019 on evaluation today patient appears to be doing a little worse with regard to both areas of weeping on her bilateral lower extremities. This is around the right medial knee and just above the left ankle. With that being said she is unfortunately not doing as well as I would like to see. I feel like she may need to potentially go see someone at the lymphedema clinic as the wraps that she needs or even beyond what we can do here at the wound care center. She really does not have wounds she just has open areas of weeping that are causing some difficulty for her. Subsequently because of this and the moisture I am concerned about the potential for infection I am going to likely give her a prophylactic antibiotic today, Keflex, just to be on the safe side. Nonetheless again there is no obvious signs of active infection at this time. 03/13/2019 on evaluation today patient appears to be doing well with regard to her bilateral lower extremities where she has been weeping compared to even last week's evaluation. I see some areas of new skin growth which is excellent  and overall I am very pleased with how things seem to be progressing. No fevers, chills, nausea, vomiting, or diarrhea. [name], [name] ([id number]) 03/20/2019 on evaluation today patient unfortunately is continuing to have issues with significant edema of the left lower extremity. Her right side seems to be doing much better. Unfortunately her left side is showing increased weeping of the lower portion of her leg. This is quite unfortunate obviously we were hoping to get her into  the lymphedema clinic they really do not seem to when I see her how if she is draining. Despite the fact this is really not wound related but more lymphedema weeping related. Nonetheless I do not know that this can be helpful for her to even go for that appointment since again I am not sure there is much that they would actually do at this point. We may need to try a 4 layer compression wrap as best we can on her leg. She is on the Augmentin currently although I am still concerned about whether or not there could be potentially something going on infection wise I would obtain a culture though I understand is not the best being that is a surface culture I just 1 to make sure I do not seem to be missing anything. 03/27/2019 on evaluation today patient appears to be doing much better in regard to the left lower extremity compared to last week. Last week she had tremendous weeping which I think was subsequent to infection now she seems to be doing much better and very pleased. This is not completely healed but there is a lot of new skin growth and it has dried out quite a bit. Overall I think that we are doing well with how things are moving along at this time. No fevers, chills, nausea, vomiting, or diarrhea. 04/03/2019 evaluation today patient appears to be doing a little worse this week compared to last time I saw her. I think this may be due to the fact that she is having issues with not being able to sleep in her bed at least not until last night. She is therefore been in a lift chair and subsequently has also had issues with not been able to use her pumps since she could not get in bed. With that being said the patient overall seems to be doing okay I do think I may want extend the antibiotic for a little bit longer at least until we can see if her edema and her weeping gets better and if it is then obviously I can always discontinue the antibiotics as of next week however I want her to continue to  have it over the next week. 04/10/2019 on evaluation today patient unfortunately is still doing poorly with regard to her left lower extremity. Her right is all things considering doing fairly well. On the left however she continues to have spreading of the area of infection and weeping which appears to be even a larger surface area than noted last week. She did have a positive culture for Pseudomonas in particular which seems to have been of concern she still has green/yellow discharge consistent with Pseudomonas and subsequently a tremendous amount of it. This has me obviously still concerned about the infection not really clearing up despite the fact that on culture it appears the Cipro should have been a good option for treating this. I think she may at this point need IV antibiotics since things are not doing better I do not want to  get worse and cause sepsis. She is in agreement with the plan and believes as well that she likely does need to go to the hospital for IV vancomycin. Or something of the like depending on what the recommendation is from the ER. 04/17/2019 on evaluation today patient appears to be doing excellent in regard to her lower extremity on the left. She was in the hospital for several days from when I sent her last we saw her until just this past Tuesday. Fortunately her drainage is significantly improved and in fact is mostly clear. There is just a couple small areas that may still drain a little bit she states that the [facility] they prescribed for her at discharge she went picked up from pharmacy and got home but has not been able to find it since. She is looked everywhere. She is wondering if I will replace that for her today I will be more than happy to do that. 05/01/2019 on evaluation today patient actually appears to be doing quite well with regard to her lower extremities. She occasionally is having areas that will leak and then heal up mainly when a piece of the  fibrotic skin pops off but fortunately she is not having any signs of active infection at this time. Overall she also really does not have any obvious weeping at this time. I do believe however she really needs some compression wraps and I think this may be a good time to get her back to the lymphedema clinic. 05/11/2019 on evaluation today patient actually appears to be doing quite well with regard to her bilateral lower extremities. She occasionally will have a small area that we per another but in general seems to be completely healed which is great news. Overall very pleased with how everything seems to be progressing. She does have her appointment with lymphedema clinic on November 18. 05/25/2019 on evaluation today patient appears to be doing well with regard to her left lower extremity. I am very pleased in this regard. In regard to her right leg this actually did start draining more I think it is mainly due to the fact that her leg is more swollen. I am not seeing any obvious signs of infection at this time although that is definitely something were obviously acutely aware of simply due to the fact that she had an issue not too far back with exactly this issue. Nonetheless I do feel like that lymphedema clinic would still be beneficial for her. I explained obviously if they are not able to do anything treatment wise on the right leg we could at least have them treat her left leg and then proceed from there. The patient is really in agreement with that plan. If they are able to do both as the drainage slows down that I would be happy to let them handle both. 06/01/2019 on evaluation today patient unfortunately appears to be doing worse with regard to her right lower extremity. The left lower extremity is still maintaining at this point. Unfortunately she has been having significantly increased pain over the past several days and has been experiencing as well increased swelling of the right lower  extremity. I really do not know that I am seeing anything that appears to be obvious for infection at this point to be peripherally honest. With that being said the patient does seem to be having much more swelling that she is even experienced in the past and coupled with increased pain in her hip as well  I am concerned that again she could potentially have a DVT although I am not 100% sure of this. I think it something that may need to be checked out. We discussed the possibility of sending her for a DVT study through the hospital but unfortunately transportation is an issue if she does have a DVT I do not want her to wait days to be able to get in for that test however if she has this scheduled as an outpatient that is as fast that she will be able to get the test scheduled for transportation purposes. That will also fall on Thanksgiving so subsequently she did actually be looking at either Friday or even next week before we would know anything back from this. That is much too long in my opinion. Subsequent to the amount of discomfort she is experiencing the patient is actually okay with going to the ER for evaluation today. 06/12/2019 on evaluation today patient actually appears to be doing significantly better compared to last time I saw her. Following when I last saw her she was actually in the hospital from that Monday until the following Sunday almost 1 full week. She actually was placed on Keflex in the hospital following the time for her to be discharged and Dr. Steva Ready has recommended 2 times a day dosing of the Keflex for the next year in order to help with more prophylactic/preventative measures with regard to her developing cellulitis. Overall I think this sounds like an excellent plan. The patient unfortunately is good to have trouble being treated at lymphedema clinic due to the fact that she really cannot get up on the bed that they have there. They also state that they cannot manage  her as long as she has anything draining at this point. Obviously that is somewhat unfortunate as she does need help with edema control but nonetheless we will have to do what we can for her outside of it sounds like the lymphedema clinic scenario at this point. 06/19/2019 on evaluation today patient appears to be doing fairly well with regard to her bilateral lower extremities. She is not nearly as swollen and shows no signs of infection at this point. There is no evidence of cellulitis whatsoever. She also has no open wounds or draining at this point which is also good news. No fever chills noted. She seems to be in very good spirits and in fact appears to be doing quite well. READMISSION 11/27/2019 [name], [name] ([id number]) This is a 66 year old woman that we have had in this clinic several times before including 2015, 16 and 19 and then most recently from 03/20/2019 through 06/19/2019 with bilateral lower extremity lymphedema. She has had previous arterial and reflux studies done years ago which were not all that remarkable. 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 She does have a positive family history and she had large legs starting may be in her 58s. She was recently in hospital from 10/20/2019 through 10/28/2019 with right leg cellulitis. She was given Ancef and clindamycin and then Zosyn when a culture showed Pseudomonas. At that time there was purulent drainage. She was followed by infectious disease Dr Steva Ready. The patient is now back at home. She has noted increased swelling in the right and no drainage in her right leg mostly on the posterior medial aspect in the calf area. She has not had pain or fever. She has literally been improved lysing above dressings because her at  the area of this is far too large for standard compression. She has been wrapping the areas with sheets to resorptive pads. She is found these helped  somewhat. She does have an appointment with the lymphedema clinic in [place] in late June. Past medical history includes bilateral lymphedema, hypertension, obstructive sleep apnea with CPAP. Recent hospitalization with apparently Pseudomonas cellulitis of the right lower leg 12/15/2019 upon evaluation today patient appears to be doing a little bit worse in regard to her right lower extremity. Unfortunately she is having more weeping down in the lower portion of her leg. Fortunately there is no signs of active infection at this time. No fever chills noted. The patient states she is not having increased pain except for when she attempted to use the lymphedema pumps unfortunately she states that she did have pain when she did this. Otherwise we been using absorptive dressings of one type or another she is using diapers at home and then subsequently Ace wraps. In regard to the barrier cream we have discussed the possibility of derma cloud which she would like to try I do not have a problem with that. 12/22/2019 upon evaluation today patient actually appears to be doing better in regard to her leg ulcers at this point. Fortunately there does not appear to be any signs of active infection which is great news and I am extremely pleased with where things are progressing at this time. There is no sign of active infection currently. The patient is very pleased to see things doing so well. 12/29/2019 upon evaluation today patient appears to be doing a little bit better in regard to her weeping in general over her lower extremities. She does have some signs of mild erythema little bit more than what I noted last week or rather last visit. Nonetheless I think that my threshold for switching her antibiotics from Keflex to something else is very low at this point considering that she has had such severe infections in the past that seem to come almost out of nowhere. There is a little erythema and warmth noted of  the lower portion of her leg compared to the upper which also makes me want to go ahead and address things more rapidly at this point. Likely I would switch out the Keflex for something like Levaquin ideally. 7/16; patient with severe bilateral lymphedema. She has superficial wounds albeit almost circumferential now on the left lateral lower leg. This may be new from last time. Small area on the right anterior lower leg and then another area on the right medial lower leg and of pannus fold. She has been using various absorptive garments. She states she is using her compression pumps once a day occasionally twice. Culture from her last visit here was negative 01/29/2020 on evaluation today patient appears to be doing excellent at this point in regard to her legs with regard to infection I see no signs of active infection at this point. She still does have unfortunately areas of weeping this is minimal on the right now her left is actually significantly worse although I do not think it is as bad as last week with Dr. Dellia Nims saw her. She has been trying to pump and elevate her legs is much as possible. She has previously been on the Keflex and in the past for prevention that seems to do fairly well and likely can extend that today. 02/04/2020 on evaluation today patient appears to be doing better in regard to her legs bilaterally.  Fortunately there is no signs of active infection at this time which is great news and overall she has less weeping on the left compared to the right and there is several spots where she is pretty much sealed up with no draining regions. Overall very pleased in this regard. 02/19/2020 on evaluation today patient appears to be doing very well in regard to her wounds currently. Fortunately there is no evidence of active infection overall very pleased with where things stand. She is significantly improved in regard to her edema I am extremely pleased in this regard she tells me that  the popping no longer hurts and in fact she actually looks forward to it. 03/04/2020 on evaluation today patient appears to be doing excellent in regard to her lower extremities. Fortunately there is no signs of active infection at this time. No fevers, chills, nausea, vomiting, or diarrhea. 03/25/2020 on evaluation today patient appears to be doing a little bit more poorly in regard to her legs at this point. She tells me that she is still continue to have issues with drainage and this has been a little bit worse she was getting ready to start taking the Keflex again but wanted to see me first. Fortunately there is no signs of active infection at this time. No fevers, chills, nausea, vomiting, or diarrhea. 04/15/2020 upon evaluation today patient appears to be doing somewhat poorly in regard to her right leg. She tells me she has been having more pain she has been taking the Keflex that was previously prescribed unfortunately that just does not seem to help with this. She was hoping that the pain on her right was actually coming from the fact that she was having issues with her wrap having gotten caught in her recliner. With that being said she tells me that she knew something was not right. Currently her right leg is warm to touch along with being erythematous all the way up to around at least mid thigh as far as I can see. The left leg does not appear to be doing that badly though there is increased weeping around the ankle region. 05/06/2020 on evaluation today patient appears to be doing much better than last time I saw her. She did go to the hospital where she was admitted for 2 days and treated with antibiotic therapy. She was discharged with antibiotics as well and has done extremely well. I am extremely pleased with where things stand today. There is no signs of active infection at this time which is great news. 05/27/2020 upon evaluation today patient appears to be doing well with regard to her  lower extremities bilaterally. She has just a very tiny area on the right leg which is opening on the left leg she is significantly improved though she still has several areas that do appear to be open this is minimal compared to what is been in the past. In general I am extremely pleased with where things stand today. The patient does tell me she is not been using her pumps quite as much as she should be. I do believe that is 1 area she can definitely work on. She has had a lot going on including a Covid exposure and apparently also a outbreak of likely shingles. 06/24/2020 upon evaluation today patient appears to be doing well with regard to her legs in general although the left leg unfortunately is showing some signs of erythema she does have a little bit of increased weeping and to be honest  I am concerned about infection here. I discussed that with her today and I think that we may need to address this sooner rather than later she has been taking Keflex she is not really certain that is been making a big improvement however. No fevers, chills, nausea, vomiting, or diarrhea. [name], [name] ([id number]) 06/30/2020 upon evaluation today patient's legs actually seem to be doing better in my opinion as compared to where they were last week. Fortunately there does not appear to be any signs of active infection. Her culture showed multiple organisms nothing predominate. With that being said the Levaquin seems to have done well I think she has improved since I last saw her as well. 07/14/2020 upon evaluation today patient appears to be doing actually better in regard to her lower extremities in my opinion. She has been tolerating the dressing changes without complication. Fortunately there is no signs of active infection at this time. 08/04/2020 upon evaluation today patient appears to be doing excellent in regard to her leg ulcers. Fortunately she has very little that is open at this point. This is  great news. In fact I think that the [employer] medicated powder has been excellent for her. It seems to have done the trick where we had tried several other things without as much success. Fortunately there is no evidence of active infection at this time. No fevers, chills, nausea, vomiting, or diarrhea. 09/01/2020 upon evaluation today patient appears to be doing a little bit more poorly than the last time I saw her. She tells me right now that she has been having a lot of drainage compared to where things were previous which has unfortunately led to more irritation as well. She is concerned that this is leading to infection. Again based on what I am seeing today as well I am also concerned of the same to be honest. 09/15/2020 upon evaluation today patient appears to be doing well with regard to her legs compared to what I saw previous. Fortunately there does not appear to be any evidence of active infection at this time which is great news. At least not as badly as what it was previous. With that being said I do believe that the patient does need to have an extension of the antibiotics. This I believe will actually help her more in the way of making sure this stays under control and does not worsen. That was the Bactrim DS. Readmission: 08/01/2021 upon evaluation today patient appears to be doing actually pretty well all things considered. Is actually been almost a year since have seen her last March. Fortunately I do not see any evidence of active infection locally nor systemically at this point. She does have a couple open areas on the left leg the right leg appears to be doing quite well. In general she has been in the hospital 3 times since I saw her a year ago all 4 cellulitis type issues except for the last 1 which was actually more related to congestive heart failure for that reason she is not using her lymphedema pumps at this point and that is probably the best idea. 08/08/2021 upon evaluation  today patient appears to be doing well with regard to her legs all things considered her left leg may be a little bit more swollen based on what I see today. Fortunately I do not see any signs of active infection locally nor systemically at this time which is great news. No fevers, chills, nausea, vomiting, or diarrhea. 08/15/2021  upon evaluation today patient actually appears to be doing excellent in regard to her wounds. She has been tolerating the dressing changes without complication. Fortunately I see no evidence of infection currently which is great news. No fevers, chills, nausea, vomiting, or diarrhea. 08/29/2021 upon evaluation patient appears to be doing excellent she in fact is almost completely healed. I am actually very pleased with where we stand today and I think that she is making wonderful progress she just has a small area of weeping on the right lateral leg everything else is pretty much completely healed which is also. 09/05/2021 upon evaluation today patient appears to be doing well with regard to her legs. She does have a small area of weeping on the right leg and there is a small area on the left leg as well. It is potentially something that may need to be addressed in the future more specifically but right now I think that there may have just been a little drainage here on the left. With all that being said I think that this still does not appear to be infected and overall I feel like she is doing quite well. That something we always have to keep a very close eye on as she can change very rapidly from okay to not okay. 09/12/2021 upon evaluation today patient appears to be doing a little bit worse in regard to her leg and actually somewhat concerned about the possibility of infection here. I discussed that with the patient. For that reason I am going to go ahead and see about getting her set up for a repeat prescription for the Bactrim. She is in agreement with that plan. 3/14;  patient presents for follow-up. She has been using silver alginate with dressing changes. She is still taking Bactrim prescribed at last clinic visit. She has no issues or complaints today. She has lymphedema pumps however does not use these. 09/26/2020 upon evaluation today patient appears to actually be doing well in regard to her right leg I am pleased in that regard. Unfortunately she has new areas on her left leg that are open at this point that I do need to be addressed. I am also concerned about the warmth around one of the wounds on the more anterior side. I think this is something that we may need to culture today potentially requiring antibiotics going forward. 10/03/2021 upon evaluation today patient unfortunately is not doing nearly as well as she was even last week with her wounds. She is having some issues here with increased cellulitis of the left lower extremity unfortunately. I did prescribe Augmentin for her eczema prescribed last week unfortunately she never actually received this prescription. The pharmacy stated they never got it. We double checked with them today they still did not receive it. I am not sure what happened in that regard. With that being said the patient is in good spirits today she does not appear to be as sick as where she normally is when the cellulitis gets significantly worse as it has in the past. Nonetheless the leg is much more red not just around the ankle where I was seeing at last week on Tuesday but rather now this is going all the way up to almost her knee and is definitely hot to touch compared to the right leg. Nonetheless I feel like she does need to go to the ER for likely IV antibiotics. 10-10-2021 upon evaluation today patient appears to be doing well with regard to her wound.  She has been tolerating the dressing changes that appears to be doing much better. After I saw her last week she did go to the ER they admitted her to the hospital and gave her  IV antibiotics she fortunately is doing significantly better. Overall I am extremely pleased with where things stand today. 10-17-2021 upon evaluation today patient appears to be doing well with regard to her wound. In fact this is looking better and her leg is looking better but she still does have some areas here of erythema. We will continue to address this as soon as possible in my opinion. I do believe she may require some debridement and what appears to be a more defined wound on her left leg at this point. 10-24-2021 upon evaluation patient is definitely showing signs of improvement which is great news. I do not see any evidence of active infection locally or systemically which is great news as well. No fevers, chills, nausea, vomiting, or diarrhea. 10-31-2021. Upon evaluation today patient's leg actually feels better from an infection and wound care perspective. I am actually very pleased in this regard. Unfortunately the biggest issue that I see at this time is that the patient is having a significant issue with her breathing. I did put her on the pulse ox machine to check her oxygen saturation and it was pretty much at 100% which is good but she tells me that when she is walking [name], [name] ([id number]) this is much more difficult for her. She also tells me that when she is trying to bend over to perform her dressing changes she is so short of breath due to the swelling and fluid in her abdominal area that she is just not been able to do this. She is tearful and very concerned today to be honest. I am truly sorry to see her this way I know she is really having a hard time at the moment. 11-14-2021 upon evaluation patient actually appears to be doing significantly better compared to when I last saw her. She was actually admitted to the hospital I believe this for 6 days total after I sent her on 10-31-2021. Subsequently they actually pulled off greater than 50 pounds of fluid she tells me she  feels significantly better her legs are not as tight she actually has some play in the tissue and it is obvious that she is doing much better just from a mobility standpoint as well as a breathing standpoint. Overall I am extremely happy for her and how she is doing the leg also looks much better on the left. 11-21-2021 upon evaluation today patient appears to be doing well although it does appear that she is going require some sharp debridement the wound is very dry this is the opposite of what its been she was having so much weeping that it was staying extremely wet. Nonetheless we do need to see about going ahead and debriding the wound today. 11-28-2021 upon evaluation today patient appears to be doing well with regard to her wound I definitely see signs of things continuing to improve which is great news. I do not see any evidence of active infection locally or systemically also great news. 12-05-2021 upon evaluation today patient appears to be doing well with regard to her wound. She has been tolerating the dressing changes. Fortunately there does not appear to be any signs of active infection locally or systemically at this time. No fevers, chills, nausea, vomiting, or diarrhea. 12-12-2021 upon evaluation patient appears  to be doing well with regard to her wound this is actually measuring smaller and looking much better. Fortunately I do not see any signs of active infection locally or systemically at this time which is excellent news. 6/13; small wound area in the folds of her lymphedema just above the left ankle. Fortunately the area looks quite good. She has been using silver alginate ABDs and an Ace wrap which she is able to change her self. She is not using her compression pumps out of fear that this could contribute to heart failure. 12-26-2021 upon evaluation patient's wounds are actually showing signs of excellent improvement. I am very pleased with where things stand. Overall I think that  she has been making excellent progress and I think we are very close to complete resolution which is great news. 01-11-2022 upon evaluation today patient appears to be doing well currently in regard to her legs in general although on the left leg she does have 1 area that still irritated and inflamed on the anterior portion of her leg. Fortunately I do not see any signs of systemic infection locally there might be some infection going on here however which is my biggest concern. Fortunately I do not see any evidence though of this spreading to any other location. 01-16-2022 upon evaluation today patient has actually showing signs of excellent improvement. Fortunately I do not see any evidence of infection locally or systemically which is great news and overall I am very pleased with where we stand at this point. 01-25-2022 upon evaluation today patient actually appears to be doing decently well in regard to her legs. She has been tolerating the dressing changes without complication. Fortunately there does not appear to be any evidence of active infection locally or systemically which is great news and overall I am extremely pleased with where we stand at this point. 02-13-2022 upon evaluation today patient appears to be doing well currently in regard to her wound which is actually showing signs of significant improvement. Fortunately I do not see any evidence of active infection locally or systemically at this time. No fevers, chills, nausea, vomiting, or diarrhea. Electronic Signature(s) Signed: 02/13/2022 9:39:51 AM By: Worthy Keeler PA-C Entered By: Worthy Keeler on 02/13/2022 09:39:51 [name], [name] ([id number]) -------------------------------------------------------------------------------- Physical Exam Details Patient Name: [name], [name] Date of Service: 02/13/2022 9:00 AM Medical Record Number: [id number] Patient Account Number: [id number] Date of Birth/Sex: 11-Jun-1956 (66 y.o.  F) Treating RN: Alycia Rossetti Primary Care Provider: Ranae Plumber Other Clinician: Massie Kluver Referring Provider: Ranae Plumber Treating Provider/Extender: Skipper Cliche in Treatment: 35 Constitutional Well-nourished and well-hydrated in no acute distress. Respiratory normal breathing without difficulty. Psychiatric this patient is able to make decisions and demonstrates good insight into disease process. Alert and Oriented x 3. pleasant and cooperative. Notes Upon inspection patient's wound bed actually showed signs of excellent improvement I think this is measuring much better as far as the overall appearance is concerned there is a lot of epithelization noted at this point. Electronic Signature(s) Signed: 02/13/2022 9:40:03 AM By: Worthy Keeler PA-C Entered By: Worthy Keeler on 02/13/2022 [place], [name] ([id number]) -------------------------------------------------------------------------------- Physician Orders Details Patient Name: [name] Date of Service: 02/13/2022 9:00 AM Medical Record Number: [id number] Patient Account Number: [id number] Date of Birth/Sex: July 08, 1956 (66 y.o. 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 o Nurse Visit as needed Bathing/ Shower/ Hygiene o Clean wound with Normal Saline or wound cleanser. o Wash wounds with antibacterial soap and water. o May shower; gently cleanse wound with antibacterial soap,  rinse and pat dry prior to dressing wounds o No tub bath. Edema Control - Lymphedema / Segmental Compressive Device / Other o Ace wraps o Elevate, Exercise Daily and Avoid Standing for Long Periods of Time. o Elevate leg(s) parallel to the floor when sitting. o DO YOUR BEST to sleep in the bed at night. DO NOT sleep in your recliner. Long hours of sitting in a recliner leads to swelling of the legs and/or potential wounds on your backside. o Other: - use lymphedema pumps Non-Wound Condition Bilateral Lower Extremities o Cleanse affected area with antibacterial soap and water, o Apply appropriate compression. - ace wrap o Additional non-wound orders/instructions: - silver cell to open weeping areas with abd pad Medications-Please add to medication list. o [name].O. Antibiotics - continue Doxycycline Wound Treatment Electronic Signature(s) Signed: 02/13/2022 11:15:02 AM By: Gretta Cool, BSN, RN, CWS, Kim RN, BSN Signed: 02/13/2022 3:58:52 PM By: Worthy Keeler PA-C Entered By: Gretta Cool, BSN, RN, CWS, Kim on 02/13/2022 11:15:02 [place], [name] ([id number]) -------------------------------------------------------------------------------- Problem List Details Patient Name: AANIYA, STERBA. Date of Service: 02/13/2022 9:00 AM Medical Record Number: [id number] Patient Account Number: [id number] Date of Birth/Sex: Oct 05, 1955 (66 y.o. 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 limited to 08/01/2021 No Yes breakdown of skin L97.821 Non-pressure chronic ulcer of other part of left lower leg limited to 08/01/2021 No Yes breakdown of skin Inactive  Problems Resolved Problems Electronic Signature(s) Signed: 02/13/2022 2:59:21 PM By: Alycia Rossetti Signed: 02/13/2022 3:58:52 PM By: Worthy Keeler PA-C Previous Signature: 02/13/2022 9:27:09 AM Version By: Worthy Keeler PA-C Entered By: Alycia Rossetti on 02/13/2022 09:53:43 [name], [name] ([id number]) -------------------------------------------------------------------------------- Progress Note Details Patient Name: [name]. Date of Service: 02/13/2022 9:00 AM Medical Record Number: [id number] Patient Account Number: [id number] Date of Birth/Sex: October 22, 1955 (66 y.o. F) Treating RN: Alycia Rossetti Primary Care Provider: Ranae Plumber Other Clinician: Massie Kluver Referring Provider: Ranae Plumber Treating Provider/Extender: Skipper Cliche in Treatment: 28 Subjective Chief Complaint Information obtained from Patient Bilateral LE lymphedema with Left LE Ulcers History of Present Illness (HPI) The patient is a 66 year old female with history of hypertension and a long-standing history of bilateral lower extremity lymphedema (first presented on 4/2) . She has had open ulcers in the past which have always responded to compression therapy. She had briefly been to a lymphedema clinic in the past which helped her at the time. this time around she stopped treatment of her lymphedema pumps approximately 2 weeks ago because of some pain in the knees and then noticed the right leg getting worse. She was seen by her PCP who put her on clindamycin 4 times a day 2 days ago. The patient has  seen [facility] and Dr. Delana Meyer had seen her last year where a vascular study including venous and arterial duplex studies were within normal limits. he had recommended compression stockings and lymphedema pumps and the patient has been using this in about 2 weeks ago. She is known to be diabetic but in the past few time she's gone to her primary care doctor her hemoglobin A1c has been normal. 02/11/2015 -  after her last visit she took my advice and went to the ER regarding the progressive cellulitis of her right lower extremity and she was admitted between July 17 and 22nd. She received IV antibiotics and then was sent home on a course of steroid-induced and oral antibiotics. She has improved much since then. 02/17/2015 -- she has been doing fine and the weeping of her legs has remarkably gone down. She has no fresh issues. READMISSION 01/15/18 This patient was given this clinic before most recently in 2016 seen by Dr. Con Memos. She has massive bilateral lymphedema and over the last 2 months this had weeping edema out of the left leg. She has compression pumps but her compliance with these has been minimal. She has [facility] they've been using TCA/ABDs/kerlix under an Ace wrap.she has had recent problems with cellulitis. She was apparently seen in the ER and 12/23/17 and given clindamycin. She was then followed by her primary doctor and given doxycycline and Keflex. The pain seems to have settled down. In April 2018 the patient had arterial studies done at [facility]. This showed triphasic waveforms throughout the right leg and mostly triphasic waveforms on the left except for monophasic at the posterior tibial artery distally. She was not felt to have evidence of right lower extremity arterial stenosis or significant problems on the left side. 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 She also had a right lower extremity venous Doppler in January 2018 this was limited by the patient's body habitus and lymphedema. Most of the proximal veins were not visualized The patient presents with an area of denuded skin on the anterior medial part of the left calf. There is weeping edema fluid here. 01/22/18; the patient has somewhat better edema control using her compression pumps twice a day and as a result she has much better epithelialization on the left  anterior calf area. Only a small open area remains. 01/29/18; the patient has been compliant with her compression pumps. Both the areas on her calf that healed. The remaining area on the left anterior leg is fully epithelialized Readmission: 02/20/2019 upon evaluation today patient presents for reevaluation due to issues that she is having with the bilateral lower extremities. She actually has wounds open on both legs. On the right she has an area in the crease of her leg on the right around the knee region which is actually draining quite a bit and actually has some fungal type appearance to it. She has been on nystatin powder that seems to have helped to some degree. In regard to the left lower extremity this is actually in the lower portion of her leg closer to the ankle and again is continuing to drain as well unfortunately. There does not appear to be any signs of active infection at this time which is good news. No fevers, chills, nausea, vomiting, or diarrhea. She tells me that since she was seen last year she is actually been doing quite well for the most part with regard to her lower extremities. Unfortunately she now  is experiencing a little bit more drainage at this time. She is concerned about getting this under control so that it does not get significantly worse. 02/27/2019 on evaluation today patient appears to be doing somewhat better in regard to her bilateral lower extremity wounds. She has been tolerating the dressing changes without complication. Fortunately there is no signs of active infection at this point. No fevers, chills, nausea, vomiting, or diarrhea. She did get her dressing supplies which is excellent news she was extremely excited to get these. She also got paperwork from [employer] for their financial assistance program where they may be able to help her out in the future if needed with supplies at discounted prices. 03/06/2019 on evaluation today patient appears to be doing a  little worse with regard to both areas of weeping on her bilateral lower extremities. This is around the right medial knee and just above the left ankle. With that being said she is unfortunately not doing as well as I would like to see. I feel like she may need to potentially go see someone at the lymphedema clinic as the wraps that she needs or even beyond what we can do here at the wound care center. She really does not have wounds she just has open areas of weeping that are causing some difficulty for her. Subsequently because of this and the moisture I am concerned about the potential for infection I am going to likely give her a prophylactic antibiotic today, Keflex, just to be on the safe side. Nonetheless again there is no obvious signs of active infection at this time. MYKAILA, BLUNCK ([id number]) 03/13/2019 on evaluation today patient appears to be doing well with regard to her bilateral lower extremities where she has been weeping compared to even last week's evaluation. I see some areas of new skin growth which is excellent and overall I am very pleased with how things seem to be progressing. No fevers, chills, nausea, vomiting, or diarrhea. 03/20/2019 on evaluation today patient unfortunately is continuing to have issues with significant edema of the left lower extremity. Her right side seems to be doing much better. Unfortunately her left side is showing increased weeping of the lower portion of her leg. This is quite unfortunate obviously we were hoping to get her into the lymphedema clinic they really do not seem to when I see her how if she is draining. Despite the fact this is really not wound related but more lymphedema weeping related. Nonetheless I do not know that this can be helpful for her to even go for that appointment since again I am not sure there is much that they would actually do at this point. We may need to try a 4 layer compression wrap as best we can on her leg. She  is on the Augmentin currently although I am still concerned about whether or not there could be potentially something going on infection wise I would obtain a culture though I understand is not the best being that is a surface culture I just 1 to make sure I do not seem to be missing anything. 03/27/2019 on evaluation today patient appears to be doing much better in regard to the left lower extremity compared to last week. Last week she had tremendous weeping which I think was subsequent to infection now she seems to be doing much better and very pleased. This is not completely healed but there is a lot of new skin growth and it has dried out quite  a bit. Overall I think that we are doing well with how things are moving along at this time. No fevers, chills, nausea, vomiting, or diarrhea. 04/03/2019 evaluation today patient appears to be doing a little worse this week compared to last time I saw her. I think this may be due to the fact that she is having issues with not being able to sleep in her bed at least not until last night. She is therefore been in a lift chair and subsequently has also had issues with not been able to use her pumps since she could not get in bed. With that being said the patient overall seems to be doing okay I do think I may want extend the antibiotic for a little bit longer at least until we can see if her edema and her weeping gets better and if it is then obviously I can always discontinue the antibiotics as of next week however I want her to continue to have it over the next week. 04/10/2019 on evaluation today patient unfortunately is still doing poorly with regard to her left lower extremity. Her right is all things considering doing fairly well. On the left however she continues to have spreading of the area of infection and weeping which appears to be even a larger surface area than noted last week. She did have a positive culture for Pseudomonas in particular which  seems to have been of concern she still has green/yellow discharge consistent with Pseudomonas and subsequently a tremendous amount of it. This has me obviously still concerned about the infection not really clearing up despite the fact that on culture it appears the Cipro should have been a good option for treating this. I think she may at this point need IV antibiotics since things are not doing better I do not want to get worse and cause sepsis. She is in agreement with the plan and believes as well that she likely does need to go to the hospital for IV vancomycin. Or something of the like depending on what the recommendation is from the ER. 04/17/2019 on evaluation today patient appears to be doing excellent in regard to her lower extremity on the left. She was in the hospital for several days from when I sent her last we saw her until just this past Tuesday. Fortunately her drainage is significantly improved and in fact is mostly clear. There is just a couple small areas that may still drain a little bit she states that the [facility] they prescribed for her at discharge she went picked up from pharmacy and got home but has not been able to find it since. She is looked everywhere. She is wondering if I will replace that for her today I will be more than happy to do that. 05/01/2019 on evaluation today patient actually appears to be doing quite well with regard to her lower extremities. She occasionally is having areas that will leak and then heal up mainly when a piece of the fibrotic skin pops off but fortunately she is not having any signs of active infection at this time. Overall she also really does not have any obvious weeping at this time. I do believe however she really needs some compression wraps and I think this may be a good time to get her back to the lymphedema clinic. 05/11/2019 on evaluation today patient actually appears to be doing quite well with regard to her bilateral lower  extremities. She occasionally will have a small area that we  per another but in general seems to be completely healed which is great news. Overall very pleased with how everything seems to be progressing. She does have her appointment with lymphedema clinic on November 18. 05/25/2019 on evaluation today patient appears to be doing well with regard to her left lower extremity. I am very pleased in this regard. In regard to her right leg this actually did start draining more I think it is mainly due to the fact that her leg is more swollen. I am not seeing any obvious signs of infection at this time although that is definitely something were obviously acutely aware of simply due to the fact that she had an issue not too far back with exactly this issue. Nonetheless I do feel like that lymphedema clinic would still be beneficial for her. I explained obviously if they are not able to do anything treatment wise on the right leg we could at least have them treat her left leg and then proceed from there. The patient is really in agreement with that plan. If they are able to do both as the drainage slows down that I would be happy to let them handle both. 06/01/2019 on evaluation today patient unfortunately appears to be doing worse with regard to her right lower extremity. The left lower extremity is still maintaining at this point. Unfortunately she has been having significantly increased pain over the past several days and has been experiencing as well increased swelling of the right lower extremity. I really do not know that I am seeing anything that appears to be obvious for infection at this point to be peripherally honest. With that being said the patient does seem to be having much more swelling that she is even experienced in the past and coupled with increased pain in her hip as well I am concerned that again she could potentially have a DVT although I am not 100% sure of this. I think it something  that may need to be checked out. We discussed the possibility of sending her for a DVT study through the hospital but unfortunately transportation is an issue if she does have a DVT I do not want her to wait days to be able to get in for that test however if she has this scheduled as an outpatient that is as fast that she will be able to get the test scheduled for transportation purposes. That will also fall on Thanksgiving so subsequently she did actually be looking at either Friday or even next week before we would know anything back from this. That is much too long in my opinion. Subsequent to the amount of discomfort she is experiencing the patient is actually okay with going to the ER for evaluation today. 06/12/2019 on evaluation today patient actually appears to be doing significantly better compared to last time I saw her. Following when I last saw her she was actually in the hospital from that Monday until the following Sunday almost 1 full week. She actually was placed on Keflex in the hospital following the time for her to be discharged and Dr. Steva Ready has recommended 2 times a day dosing of the Keflex for the next year in order to help with more prophylactic/preventative measures with regard to her developing cellulitis. Overall I think this sounds like an excellent plan. The patient unfortunately is good to have trouble being treated at lymphedema clinic due to the fact that she really cannot get up on the bed that they have there.  They also state that they cannot manage her as long as she has anything draining at this point. Obviously that is somewhat unfortunate as she does need help with edema control but nonetheless we will have to do what we can for her outside of it sounds like the lymphedema clinic scenario at this point. 06/19/2019 on evaluation today patient appears to be doing fairly well with regard to her bilateral lower extremities. She is not nearly as swollen [name],  [name] ([id number]) and shows no signs of infection at this point. There is no evidence of cellulitis whatsoever. She also has no open wounds or draining at this point which is also good news. No fever chills noted. She seems to be in very good spirits and in fact appears to be doing quite well. READMISSION 11/27/2019 This is a 66 year old woman that we have had in this clinic several times before including 2015, 16 and 19 and then most recently from 03/20/2019 through 06/19/2019 with bilateral lower extremity lymphedema. She has had previous arterial and reflux studies done years ago which were not all that remarkable. 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 She does have a positive family history and she had large legs starting may be in her 50s. She was recently in hospital from 10/20/2019 through 10/28/2019 with right leg cellulitis. She was given Ancef and clindamycin and then Zosyn when a culture showed Pseudomonas. At that time there was purulent drainage. She was followed by infectious disease Dr Steva Ready. The patient is now back at home. She has noted increased swelling in the right and no drainage in her right leg mostly on the posterior medial aspect in the calf area. She has not had pain or fever. She has literally been improved lysing above dressings because her at the area of this is far too large for standard compression. She has been wrapping the areas with sheets to resorptive pads. She is found these helped somewhat. She does have an appointment with the lymphedema clinic in [place] in late June. Past medical history includes bilateral lymphedema, hypertension, obstructive sleep apnea with CPAP. Recent hospitalization with apparently Pseudomonas cellulitis of the right lower leg 12/15/2019 upon evaluation today patient appears to be doing a little bit worse in regard to her right lower extremity. Unfortunately she is  having more weeping down in the lower portion of her leg. Fortunately there is no signs of active infection at this time. No fever chills noted. The patient states she is not having increased pain except for when she attempted to use the lymphedema pumps unfortunately she states that she did have pain when she did this. Otherwise we been using absorptive dressings of one type or another she is using diapers at home and then subsequently Ace wraps. In regard to the barrier cream we have discussed the possibility of derma cloud which she would like to try I do not have a problem with that. 12/22/2019 upon evaluation today patient actually appears to be doing better in regard to her leg ulcers at this point. Fortunately there does not appear to be any signs of active infection which is great news and I am extremely pleased with where things are progressing at this time. There is no sign of active infection currently. The patient is very pleased to see things doing so well. 12/29/2019 upon evaluation today patient appears to be doing a little bit better in regard to her weeping in general over  her lower extremities. She does have some signs of mild erythema little bit more than what I noted last week or rather last visit. Nonetheless I think that my threshold for switching her antibiotics from Keflex to something else is very low at this point considering that she has had such severe infections in the past that seem to come almost out of nowhere. There is a little erythema and warmth noted of the lower portion of her leg compared to the upper which also makes me want to go ahead and address things more rapidly at this point. Likely I would switch out the Keflex for something like Levaquin ideally. 7/16; patient with severe bilateral lymphedema. She has superficial wounds albeit almost circumferential now on the left lateral lower leg. This may be new from last time. Small area on the right anterior lower  leg and then another area on the right medial lower leg and of pannus fold. She has been using various absorptive garments. She states she is using her compression pumps once a day occasionally twice. Culture from her last visit here was negative 01/29/2020 on evaluation today patient appears to be doing excellent at this point in regard to her legs with regard to infection I see no signs of active infection at this point. She still does have unfortunately areas of weeping this is minimal on the right now her left is actually significantly worse although I do not think it is as bad as last week with Dr. Dellia Nims saw her. She has been trying to pump and elevate her legs is much as possible. She has previously been on the Keflex and in the past for prevention that seems to do fairly well and likely can extend that today. 02/04/2020 on evaluation today patient appears to be doing better in regard to her legs bilaterally. Fortunately there is no signs of active infection at this time which is great news and overall she has less weeping on the left compared to the right and there is several spots where she is pretty much sealed up with no draining regions. Overall very pleased in this regard. 02/19/2020 on evaluation today patient appears to be doing very well in regard to her wounds currently. Fortunately there is no evidence of active infection overall very pleased with where things stand. She is significantly improved in regard to her edema I am extremely pleased in this regard she tells me that the popping no longer hurts and in fact she actually looks forward to it. 03/04/2020 on evaluation today patient appears to be doing excellent in regard to her lower extremities. Fortunately there is no signs of active infection at this time. No fevers, chills, nausea, vomiting, or diarrhea. 03/25/2020 on evaluation today patient appears to be doing a little bit more poorly in regard to her legs at this point. She  tells me that she is still continue to have issues with drainage and this has been a little bit worse she was getting ready to start taking the Keflex again but wanted to see me first. Fortunately there is no signs of active infection at this time. No fevers, chills, nausea, vomiting, or diarrhea. 04/15/2020 upon evaluation today patient appears to be doing somewhat poorly in regard to her right leg. She tells me she has been having more pain she has been taking the Keflex that was previously prescribed unfortunately that just does not seem to help with this. She was hoping that the pain on her right was actually coming  from the fact that she was having issues with her wrap having gotten caught in her recliner. With that being said she tells me that she knew something was not right. Currently her right leg is warm to touch along with being erythematous all the way up to around at least mid thigh as far as I can see. The left leg does not appear to be doing that badly though there is increased weeping around the ankle region. 05/06/2020 on evaluation today patient appears to be doing much better than last time I saw her. She did go to the hospital where she was admitted for 2 days and treated with antibiotic therapy. She was discharged with antibiotics as well and has done extremely well. I am extremely pleased with where things stand today. There is no signs of active infection at this time which is great news. 05/27/2020 upon evaluation today patient appears to be doing well with regard to her lower extremities bilaterally. She has just a very tiny area on the right leg which is opening on the left leg she is significantly improved though she still has several areas that do appear to be open this is minimal compared to what is been in the past. In general I am extremely pleased with where things stand today. The patient does tell me she is not been using her pumps quite as much as she should be. I do  believe that is 1 area she can definitely work on. She has had a lot going on including a Covid exposure and apparently also a outbreak of likely shingles. PEYTAN, ANDRINGA ([id number]) 06/24/2020 upon evaluation today patient appears to be doing well with regard to her legs in general although the left leg unfortunately is showing some signs of erythema she does have a little bit of increased weeping and to be honest I am concerned about infection here. I discussed that with her today and I think that we may need to address this sooner rather than later she has been taking Keflex she is not really certain that is been making a big improvement however. No fevers, chills, nausea, vomiting, or diarrhea. 06/30/2020 upon evaluation today patient's legs actually seem to be doing better in my opinion as compared to where they were last week. Fortunately there does not appear to be any signs of active infection. Her culture showed multiple organisms nothing predominate. With that being said the Levaquin seems to have done well I think she has improved since I last saw her as well. 07/14/2020 upon evaluation today patient appears to be doing actually better in regard to her lower extremities in my opinion. She has been tolerating the dressing changes without complication. Fortunately there is no signs of active infection at this time. 08/04/2020 upon evaluation today patient appears to be doing excellent in regard to her leg ulcers. Fortunately she has very little that is open at this point. This is great news. In fact I think that the Gold[employer] medicated powder has been excellent for her. It seems to have done the trick where we had tried several other things without as much success. Fortunately there is no evidence of active infection at this time. No fevers, chills, nausea, vomiting, or diarrhea. 09/01/2020 upon evaluation today patient appears to be doing a little bit more poorly than the last time I saw her.  She tells me right now that she has been having a lot of drainage compared to where things were previous which has unfortunately  led to more irritation as well. She is concerned that this is leading to infection. Again based on what I am seeing today as well I am also concerned of the same to be honest. 09/15/2020 upon evaluation today patient appears to be doing well with regard to her legs compared to what I saw previous. Fortunately there does not appear to be any evidence of active infection at this time which is great news. At least not as badly as what it was previous. With that being said I do believe that the patient does need to have an extension of the antibiotics. This I believe will actually help her more in the way of making sure this stays under control and does not worsen. That was the Bactrim DS. Readmission: 08/01/2021 upon evaluation today patient appears to be doing actually pretty well all things considered. Is actually been almost a year since have seen her last March. Fortunately I do not see any evidence of active infection locally nor systemically at this point. She does have a couple open areas on the left leg the right leg appears to be doing quite well. In general she has been in the hospital 3 times since I saw her a year ago all 4 cellulitis type issues except for the last 1 which was actually more related to congestive heart failure for that reason she is not using her lymphedema pumps at this point and that is probably the best idea. 08/08/2021 upon evaluation today patient appears to be doing well with regard to her legs all things considered her left leg may be a little bit more swollen based on what I see today. Fortunately I do not see any signs of active infection locally nor systemically at this time which is great news. No fevers, chills, nausea, vomiting, or diarrhea. 08/15/2021 upon evaluation today patient actually appears to be doing excellent in regard to her  wounds. She has been tolerating the dressing changes without complication. Fortunately I see no evidence of infection currently which is great news. No fevers, chills, nausea, vomiting, or diarrhea. 08/29/2021 upon evaluation patient appears to be doing excellent she in fact is almost completely healed. I am actually very pleased with where we stand today and I think that she is making wonderful progress she just has a small area of weeping on the right lateral leg everything else is pretty much completely healed which is also. 09/05/2021 upon evaluation today patient appears to be doing well with regard to her legs. She does have a small area of weeping on the right leg and there is a small area on the left leg as well. It is potentially something that may need to be addressed in the future more specifically but right now I think that there may have just been a little drainage here on the left. With all that being said I think that this still does not appear to be infected and overall I feel like she is doing quite well. That something we always have to keep a very close eye on as she can change very rapidly from okay to not okay. 09/12/2021 upon evaluation today patient appears to be doing a little bit worse in regard to her leg and actually somewhat concerned about the possibility of infection here. I discussed that with the patient. For that reason I am going to go ahead and see about getting her set up for a repeat prescription for the Bactrim. She is in agreement with that plan.  3/14; patient presents for follow-up. She has been using silver alginate with dressing changes. She is still taking Bactrim prescribed at last clinic visit. She has no issues or complaints today. She has lymphedema pumps however does not use these. 09/26/2020 upon evaluation today patient appears to actually be doing well in regard to her right leg I am pleased in that regard. Unfortunately she has new areas on her left leg  that are open at this point that I do need to be addressed. I am also concerned about the warmth around one of the wounds on the more anterior side. I think this is something that we may need to culture today potentially requiring antibiotics going forward. 10/03/2021 upon evaluation today patient unfortunately is not doing nearly as well as she was even last week with her wounds. She is having some issues here with increased cellulitis of the left lower extremity unfortunately. I did prescribe Augmentin for her eczema prescribed last week unfortunately she never actually received this prescription. The pharmacy stated they never got it. We double checked with them today they still did not receive it. I am not sure what happened in that regard. With that being said the patient is in good spirits today she does not appear to be as sick as where she normally is when the cellulitis gets significantly worse as it has in the past. Nonetheless the leg is much more red not just around the ankle where I was seeing at last week on Tuesday but rather now this is going all the way up to almost her knee and is definitely hot to touch compared to the right leg. Nonetheless I feel like she does need to go to the ER for likely IV antibiotics. 10-10-2021 upon evaluation today patient appears to be doing well with regard to her wound. She has been tolerating the dressing changes that appears to be doing much better. After I saw her last week she did go to the ER they admitted her to the hospital and gave her IV antibiotics she fortunately is doing significantly better. Overall I am extremely pleased with where things stand today. 10-17-2021 upon evaluation today patient appears to be doing well with regard to her wound. In fact this is looking better and her leg is looking better but she still does have some areas here of erythema. We will continue to address this as soon as possible in my opinion. I do believe she may  require some debridement and what appears to be a more defined wound on her left leg at this point. 10-24-2021 upon evaluation patient is definitely showing signs of improvement which is great news. I do not see any evidence of active infection Mcgregor, [place]. ([id number]) locally or systemically which is great news as well. No fevers, chills, nausea, vomiting, or diarrhea. 10-31-2021. Upon evaluation today patient's leg actually feels better from an infection and wound care perspective. I am actually very pleased in this regard. Unfortunately the biggest issue that I see at this time is that the patient is having a significant issue with her breathing. I did put her on the pulse ox machine to check her oxygen saturation and it was pretty much at 100% which is good but she tells me that when she is walking this is much more difficult for her. She also tells me that when she is trying to bend over to perform her dressing changes she is so short of breath due to the swelling and fluid  in her abdominal area that she is just not been able to do this. She is tearful and very concerned today to be honest. I am truly sorry to see her this way I know she is really having a hard time at the moment. 11-14-2021 upon evaluation patient actually appears to be doing significantly better compared to when I last saw her. She was actually admitted to the hospital I believe this for 6 days total after I sent her on 10-31-2021. Subsequently they actually pulled off greater than 50 pounds of fluid she tells me she feels significantly better her legs are not as tight she actually has some play in the tissue and it is obvious that she is doing much better just from a mobility standpoint as well as a breathing standpoint. Overall I am extremely happy for her and how she is doing the leg also looks much better on the left. 11-21-2021 upon evaluation today patient appears to be doing well although it does appear that she is going  require some sharp debridement the wound is very dry this is the opposite of what its been she was having so much weeping that it was staying extremely wet. Nonetheless we do need to see about going ahead and debriding the wound today. 11-28-2021 upon evaluation today patient appears to be doing well with regard to her wound I definitely see signs of things continuing to improve which is great news. I do not see any evidence of active infection locally or systemically also great news. 12-05-2021 upon evaluation today patient appears to be doing well with regard to her wound. She has been tolerating the dressing changes. Fortunately there does not appear to be any signs of active infection locally or systemically at this time. No fevers, chills, nausea, vomiting, or diarrhea. 12-12-2021 upon evaluation patient appears to be doing well with regard to her wound this is actually measuring smaller and looking much better. Fortunately I do not see any signs of active infection locally or systemically at this time which is excellent news. 6/13; small wound area in the folds of her lymphedema just above the left ankle. Fortunately the area looks quite good. She has been using silver alginate ABDs and an Ace wrap which she is able to change her self. She is not using her compression pumps out of fear that this could contribute to heart failure. 12-26-2021 upon evaluation patient's wounds are actually showing signs of excellent improvement. I am very pleased with where things stand. Overall I think that she has been making excellent progress and I think we are very close to complete resolution which is great news. 01-11-2022 upon evaluation today patient appears to be doing well currently in regard to her legs in general although on the left leg she does have 1 area that still irritated and inflamed on the anterior portion of her leg. Fortunately I do not see any signs of systemic infection locally there might be  some infection going on here however which is my biggest concern. Fortunately I do not see any evidence though of this spreading to any other location. 01-16-2022 upon evaluation today patient has actually showing signs of excellent improvement. Fortunately I do not see any evidence of infection locally or systemically which is great news and overall I am very pleased with where we stand at this point. 01-25-2022 upon evaluation today patient actually appears to be doing decently well in regard to her legs. She has been tolerating the dressing changes without  complication. Fortunately there does not appear to be any evidence of active infection locally or systemically which is great news and overall I am extremely pleased with where we stand at this point. 02-13-2022 upon evaluation today patient appears to be doing well currently in regard to her wound which is actually showing signs of significant improvement. Fortunately I do not see any evidence of active infection locally or systemically at this time. No fevers, chills, nausea, vomiting, or diarrhea. Objective Constitutional Well-nourished and well-hydrated in no acute distress. Vitals Time Taken: 9:05 AM, Height: 63 in, Weight: 372 lbs, BMI: 65.9, Temperature: 98.4 F, Pulse: 75 bpm, Respiratory Rate: 18 breaths/min, Blood Pressure: 129/72 mmHg. Respiratory normal breathing without difficulty. Psychiatric this patient is able to make decisions and demonstrates good insight into disease process. Alert and Oriented x 3. pleasant and cooperative. General Notes: Upon inspection patient's wound bed actually showed signs of excellent improvement I think this is measuring much better as far Santerre, Ritika C. ([id number]) as the overall appearance is concerned there is a lot of epithelization noted at this point. Integumentary (Hair, Skin) Wound #10 status is Open. Original cause of wound was Gradually Appeared. The date acquired was: 10/17/2021. The  wound has been in treatment 17 weeks. The wound is located on the Left,Medial,Anterior Lower Leg. The wound measures 3cm length x 3cm width x 0.1cm depth; 7.069cm^2 area and 0.707cm^3 volume. There is Fat Layer (Subcutaneous Tissue) exposed. There is a medium amount of serosanguineous drainage noted. There is medium (34-66%) red granulation within the wound bed. There is a small (1-33%) amount of necrotic tissue within the wound bed. 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 I am going to suggest that we go ahead and continue with the wound care measures as before and the patient is in agreement with plan. This includes the use of the silver alginate dressing. 2. I am also can recommend at this time that we have the patient continue to monitor for any evidence of worsening or infection. Obviously based on what I am seeing right now I do believe that the patient is improving significantly and overall I think we are on the right track here. We will see patient back for reevaluation in 1 week here in the clinic. If anything worsens or changes patient will contact our office for additional recommendations. Electronic Signature(s) Signed: 02/13/2022 9:40:55 AM By: Worthy Keeler PA-C Entered By: Worthy Keeler on 02/13/2022 09:40:55 Mangold, [name] ([id number]) -------------------------------------------------------------------------------- SuperBill Details Patient Name: [name] Date of Service: 02/13/2022 Medical Record Number: [id number] Patient Account Number: [id number] Date of Birth/Sex: March 26, 1956 (66 y.o. F) Treating RN: Alycia Rossetti Primary Care Provider: Ranae Plumber Other Clinician: Massie Kluver Referring Provider: Ranae Plumber Treating Provider/Extender: Skipper Cliche in Treatment: 28 Diagnosis Coding ICD-10 Codes Code Description Q82.0 Hereditary lymphedema L97.811 Non-pressure chronic ulcer of other part of right lower leg limited to breakdown of skin L97.821 Non-pressure chronic ulcer of other part of left lower leg limited to breakdown of skin Facility Procedures CPT4 Code: 02774128 Description: 99213 - WOUND CARE VISIT-LEV 3 EST PT Modifier: Quantity: 1 Physician Procedures CPT4 Code: 7867672 Description: 09470 - WC PHYS LEVEL 3 - EST PT Modifier: Quantity: 1 CPT4 Code: Description: ICD-10 Diagnosis Description Q82.0 Hereditary lymphedema L97.811 Non-pressure chronic ulcer of  other part of right lower leg limited to bre L97.821 Non-pressure chronic ulcer of other part of left lower leg limited to brea Modifier: akdown of skin kdown of skin Quantity: Electronic Signature(s) Signed: 02/13/2022 2:59:21 PM By: Alycia Rossetti Signed: 02/13/2022 3:58:52 PM By: Worthy Keeler PA-C Previous Signature: 02/13/2022 9:41:21 AM Version By: Worthy Keeler PA-C Entered By: Alycia Rossetti on 02/13/2022 09:53:06

## 2022-02-13 NOTE — Progress Notes (Signed)
[name] ([id number]) Visit Report for 02/13/2022 Arrival Information Details Patient Name: [name], [name]. Date of Service: 02/13/2022 9:00 AM Medical Record Number: [id number] Patient Account Number: [id number] Date of Birth/Sex: 23-Jan-1956 (66 y.o. F) Treating RN: Alycia Rossetti Primary Care Bridgitte Felicetti: Ranae Plumber Other Clinician: Massie Kluver Referring Livio Ledwith: Ranae Plumber Treating Jewelene Mairena/Extender: Skipper Cliche in Treatment: 28 Visit Information History Since Last Visit Any new allergies or adverse reactions: No Patient Arrived: [name] Had a fall or experienced change in No Arrival Time: 08:53 activities of daily living that may affect Accompanied By: self risk of falls: Transfer Assistance: None Hospitalized since last visit: No Patient Has Alerts: Yes Has Dressing in Place as Prescribed: Yes Patient Alerts: Borderline Diabetic Has Compression in Place as Prescribed: Yes Pain Present Now: No Electronic Signature(s) Signed: 02/13/2022 12:06:36 PM By: Alycia Rossetti Entered By: Alycia Rossetti on 02/13/2022 Seibert, [name] ([id number]) -------------------------------------------------------------------------------- Clinic Level of Care Assessment Details Patient Name: [name]. Date of Service: 02/13/2022 9:00 AM Medical Record Number: [id number] Patient Account Number: [id number] Date of Birth/Sex: 20-Jul-1955 (66 y.o. F) Treating RN: Alycia Rossetti Primary Care Jaquana Geiger: Ranae Plumber Other Clinician: Massie Kluver Referring Emaly Boschert: Ranae Plumber Treating Villa Burgin/Extender: Skipper Cliche in Treatment: 28 Clinic Level of Care Assessment Items TOOL 4 Quantity Score '[]'$  - Use when only an EandM is performed on FOLLOW-UP visit 0 ASSESSMENTS - Nursing Assessment / Reassessment X - Reassessment of Co-morbidities (includes updates in patient status) 1 10 X- 1 5 Reassessment of Adherence to Treatment Plan ASSESSMENTS - Wound and Skin  Assessment / Reassessment X - Simple Wound Assessment / Reassessment - one wound 1 5 '[]'$  - 0 Complex Wound Assessment / Reassessment - multiple wounds '[]'$  - 0 Dermatologic / Skin Assessment (not related to wound area) ASSESSMENTS - Focused Assessment '[]'$  - Circumferential Edema Measurements - multi extremities 0 '[]'$  - 0 Nutritional Assessment / Counseling / Intervention '[]'$  - 0 Lower Extremity Assessment (monofilament, tuning fork, pulses) '[]'$  - 0 Peripheral Arterial Disease Assessment (using hand held doppler) ASSESSMENTS - Ostomy and/or Continence Assessment and Care '[]'$  - Incontinence Assessment and Management 0 '[]'$  - 0 Ostomy Care Assessment and Management (repouching, etc.) PROCESS - Coordination of Care X - Simple Patient / Family Education for ongoing care 1 15 '[]'$  - 0 Complex (extensive) Patient / Family Education for ongoing care '[]'$  - 0 Staff obtains Programmer, systems, Records, Test Results / Process Orders '[]'$  - 0 Staff telephones HHA, Nursing Homes / Clarify orders / etc '[]'$  - 0 Routine Transfer to another Facility (non-emergent condition) '[]'$  - 0 Routine Hospital Admission (non-emergent condition) '[]'$  - 0 New Admissions / Biomedical engineer / Ordering NPWT, Apligraf, etc. X- 1 20 Emergency Hospital Admission (emergent condition) '[]'$  - 0 Simple Discharge Coordination '[]'$  - 0 Complex (extensive) Discharge Coordination PROCESS - Special Needs '[]'$  - Pediatric / Minor Patient Management 0 '[]'$  - 0 Isolation Patient Management '[]'$  - 0 Hearing / Language / Visual special needs '[]'$  - 0 Assessment of Community assistance (transportation, D/C planning, etc.) '[]'$  - 0 Additional assistance / Altered mentation '[]'$  - 0 Support Surface(s) Assessment (bed, cushion, seat, etc.) INTERVENTIONS - Wound Cleansing / Measurement Hanel, Mertha C. ([id number]) X- 1 5 Simple Wound Cleansing - one wound '[]'$  - 0 Complex Wound Cleansing - multiple wounds X- 1 5 Wound Imaging (photographs - any number  of wounds) '[]'$  - 0 Wound Tracing (instead of photographs) X- 1 5 Simple Wound Measurement - one wound '[]'$  -  0 Complex Wound Measurement - multiple wounds INTERVENTIONS - Wound Dressings '[]'$  - Small Wound Dressing one or multiple wounds 0 '[]'$  - 0 Medium Wound Dressing one or multiple wounds X- 1 20 Large Wound Dressing one or multiple wounds '[]'$  - 0 Application of Medications - topical '[]'$  - 0 Application of Medications - injection INTERVENTIONS - Miscellaneous '[]'$  - External ear exam 0 '[]'$  - 0 Specimen Collection (cultures, biopsies, blood, body fluids, etc.) '[]'$  - 0 Specimen(s) / Culture(s) sent or taken to Lab for analysis '[]'$  - 0 Patient Transfer (multiple staff / Civil Service fast streamer / Similar devices) '[]'$  - 0 Simple Staple / Suture removal (25 or less) '[]'$  - 0 Complex Staple / Suture removal (26 or more) '[]'$  - 0 Hypo / Hyperglycemic Management (close monitor of Blood Glucose) '[]'$  - 0 Ankle / Brachial Index (ABI) - do not check if billed separately '[]'$  - 0 Vital Signs Has the patient been seen at the hospital within the last three years: Yes Total Score: 90 Level Of Care: New/Established - Level 3 Electronic Signature(s) Signed: 02/13/2022 2:59:21 PM By: Alycia Rossetti Entered By: Alycia Rossetti on 02/13/2022 09:52:52 Sherrin, [name] ([id number]) -------------------------------------------------------------------------------- Encounter Discharge Information Details Patient Name: [name]. Date of Service: 02/13/2022 9:00 AM Medical Record Number: [id number] Patient Account Number: [id number] Date of Birth/Sex: [birth year] (66 y.o. F) Treating RN: Alycia Rossetti Primary Care Ubah Radke: Ranae Plumber Other Clinician: Massie Kluver Referring Jasnoor Trussell: Ranae Plumber Treating Ellinore Merced/Extender: Skipper Cliche in Treatment: 28 Encounter Discharge Information Items Discharge Condition: Stable Ambulatory Status: [place] Discharge Destination: Home Transportation: Private Auto Schedule  Follow-up Appointment: Yes Clinical Summary of Care: Electronic Signature(s) Signed: 02/13/2022 2:59:21 PM By: Alycia Rossetti Entered By: Alycia Rossetti on 02/13/2022 09:55:11 Stavely, [name] ([id number]) -------------------------------------------------------------------------------- Lower Extremity Assessment Details Patient Name: [name]. Date of Service: 02/13/2022 9:00 AM Medical Record Number: [id number] Patient Account Number: [id number] Date of Birth/Sex: 01/23/1956 (66 y.o. F) Treating RN: Alycia Rossetti Primary Care Annalyce Lanpher: Ranae Plumber Other Clinician: Massie Kluver Referring Brande Uncapher: Ranae Plumber Treating Easter Kennebrew/Extender: Jeri Cos Weeks in Treatment: 28 Electronic Signature(s) Signed: 02/13/2022 2:59:21 PM By: Alycia Rossetti Entered By: Alycia Rossetti on 02/13/2022 09:48:40 Demauro, [name] ([id number]) -------------------------------------------------------------------------------- Multi Wound Chart Details Patient Name: [name]. Date of Service: 02/13/2022 9:00 AM Medical Record Number: [id number] Patient Account Number: [id number] Date of Birth/Sex: 10-14-1955 (66 y.o. F) Treating RN: Alycia Rossetti Primary Care Viet Kemmerer: Ranae Plumber Other Clinician: Massie Kluver Referring Amandajo Gonder: Ranae Plumber Treating Lucillie Kiesel/Extender: Skipper Cliche in Treatment: 28 Vital Signs Height(in): 34 Pulse(bpm): 32 Weight(lbs): 093 Blood Pressure(mmHg): 129/72 Body Mass Index(BMI): 65.9 Temperature(F): 98.4 Respiratory Rate(breaths/min): 18 Photos: [N/A:N/A] Wound Location: Left, Medial, Anterior Lower Leg N/A N/A Wounding Event: Gradually Appeared N/A N/A Primary Etiology: Lymphedema N/A N/A Comorbid History: Cataracts, Chronic sinus N/A N/A problems/congestion, Anemia, Lymphedema, Asthma, Sleep Apnea, Tuberculosis, Angina, Hypertension, Gout, Osteoarthritis Date Acquired: 10/17/2021 N/A N/A Weeks of Treatment: 17 N/A N/A Wound Status: Open N/A  N/A Wound Recurrence: No N/A N/A Measurements L x W x D (cm) 3x3x0.1 N/A N/A Area (cm) : 7.069 N/A N/A Volume (cm) : 0.707 N/A N/A % Reduction in Area: 10.00% N/A N/A % Reduction in Volume: 9.90% N/A N/A Classification: Full Thickness Without Exposed N/A N/A Support Structures Exudate Amount: Medium N/A N/A Exudate Type: Serosanguineous N/A N/A Exudate Color: red, brown N/A N/A Granulation Amount: Medium (34-66%) N/A N/A Granulation Quality: Red N/A N/A Necrotic Amount: Small (1-33%) N/A N/A  Exposed Structures: Fat Layer (Subcutaneous Tissue): N/A N/A Yes Epithelialization: Large (67-100%) N/A N/A Treatment Notes Electronic Signature(s) Signed: 02/13/2022 2:59:21 PM By: Alycia Rossetti Entered By: Alycia Rossetti on 02/13/2022 09:49:10 Klippel, [name] ([id number]) -------------------------------------------------------------------------------- Clay Details Patient Name: RAELEA, GOSSE. Date of Service: 02/13/2022 9:00 AM Medical Record Number: [id number] Patient Account Number: [id number] Date of Birth/Sex: [birth year] (66 y.o. F) Treating RN: Alycia Rossetti Primary Care Dalanie Kisner: Ranae Plumber Other Clinician: Massie Kluver Referring Stedman [place]: Ranae Plumber Treating Leita Lindbloom/Extender: Skipper Cliche in Treatment: 28 Active Inactive Wound/Skin Impairment Nursing Diagnoses: Impaired tissue integrity Knowledge deficit related to ulceration/compromised skin integrity Goals: Ulcer/skin breakdown will have a volume reduction of 30% by week 4 Date Initiated: 08/01/2021 Target Resolution Date: 08/29/2021 Goal Status: Active Ulcer/skin breakdown will have a volume reduction of 50% by week 8 Date Initiated: 08/01/2021 Target Resolution Date: 09/26/2021 Goal Status: Active Ulcer/skin breakdown will have a volume reduction of 80% by week 12 Date Initiated: 08/01/2021 Target Resolution Date: 10/24/2021 Goal Status: Active Ulcer/skin breakdown will heal within  14 weeks Date Initiated: 08/01/2021 Target Resolution Date: 11/07/2021 Goal Status: Active Interventions: Assess patient/caregiver ability to obtain necessary supplies Assess patient/caregiver ability to perform ulcer/skin care regimen upon admission and as needed Assess ulceration(s) every visit Provide education on ulcer and skin care Notes: Electronic Signature(s) Signed: 02/13/2022 2:59:21 PM By: Alycia Rossetti Entered By: Alycia Rossetti on 02/13/2022 09:48:48 Maziarz, [name] ([id number]) -------------------------------------------------------------------------------- Pain Assessment Details Patient Name: [name]. Date of Service: 02/13/2022 9:00 AM Medical Record Number: [id number] Patient Account Number: [id number] Date of Birth/Sex: 1955-12-20 (66 y.o. F) Treating RN: Alycia Rossetti Primary Care Iszabella Hebenstreit: Ranae Plumber Other Clinician: Massie Kluver Referring Nazair Fortenberry: Ranae Plumber Treating [place] Dohmen/Extender: Skipper Cliche in Treatment: 28 Active Problems Location of Pain Severity and Description of Pain Patient Has Paino No Site Locations Pain Management and Medication Current Pain Management: Goals for Pain Management No pain in legs, mild pain in right hip from sleeping in recliner. RN encouraged patient to sleep in bed at night as per POC Electronic Signature(s) Signed: 02/13/2022 2:59:21 PM By: Alycia Rossetti Entered By: Alycia Rossetti on 02/13/2022 [place], [name] ([id number]) -------------------------------------------------------------------------------- Patient/Caregiver Education Details Patient Name: [name] Date of Service: 02/13/2022 9:00 AM Medical Record Number: [id number] Patient Account Number: [id number] Date of Birth/Gender: 09-05-1955 (66 y.o. F) Treating RN: Alycia Rossetti Primary Care Physician: Ranae Plumber Other Clinician: Massie Kluver Referring Physician: Ranae Plumber Treating Physician/Extender: Skipper Cliche  in Treatment: 28 Education Assessment Education Provided To: Patient Education Topics Provided Wound/Skin Impairment: Handouts: Caring for Your Ulcer Methods: Demonstration, Explain/Verbal Responses: State content correctly Electronic Signature(s) Signed: 02/13/2022 2:59:21 PM By: Alycia Rossetti Entered By: Alycia Rossetti on 02/13/2022 09:53:36 Vargo, [name] ([id number]) -------------------------------------------------------------------------------- Wound Assessment Details Patient Name: [name] Date of Service: 02/13/2022 9:00 AM Medical Record Number: [id number] Patient Account Number: [id number] Date of Birth/Sex: [birth year] (66 y.o. F) Treating RN: Alycia Rossetti Primary Care Bennette Hasty: Ranae Plumber Other Clinician: Massie Kluver Referring Alondra Vandeven: Ranae Plumber Treating Vasti Yagi/Extender: Skipper Cliche in Treatment: 28 Wound Status Wound Number: 10 Primary Lymphedema Etiology: Wound Location: Left, Medial, Anterior Lower Leg Wound Open Wounding Event: Gradually Appeared Status: Date Acquired: 10/17/2021 Comorbid Cataracts, Chronic sinus problems/congestion, Anemia, Weeks Of Treatment: 17 History: Lymphedema, Asthma, Sleep Apnea, Tuberculosis, Angina, Clustered Wound: No Hypertension, Gout, Osteoarthritis Photos Wound Measurements Length: (cm) 3 Width: (cm) 3 Depth: (cm) 0.1 Area: (cm) 7.069 Volume: (cm)  0.707 % Reduction in Area: 10% % Reduction in Volume: 9.9% Epithelialization: Large (67-100%) Tunneling: No Undermining: No Wound Description Classification: Full Thickness Without Exposed Support Structu Exudate Amount: Medium Exudate Type: Serosanguineous Exudate Color: red, brown res Foul Odor After Cleansing: No Slough/Fibrino Yes Wound Bed Granulation Amount: Medium (34-66%) Exposed Structure Granulation Quality: Red Fat Layer (Subcutaneous Tissue) Exposed: Yes Necrotic Amount: Small (1-33%) Treatment Notes Wound #10 (Lower Leg)  Wound Laterality: Left, Medial, Anterior Cleanser Peri-Wound Care Topical Primary Dressing Secondary Dressing Secured With TOWANA, STENGLEIN ([id number]) Compression Wrap Compression Stockings Add-Ons Electronic Signature(s) Signed: 02/13/2022 2:59:21 PM By: Alycia Rossetti Entered By: Alycia Rossetti on 02/13/2022 09:48:17 Sharron, [name] ([id number]) -------------------------------------------------------------------------------- Vitals Details Patient Name: [name]. Date of Service: 02/13/2022 9:00 AM Medical Record Number: [id number] Patient Account Number: [id number] Date of Birth/Sex: 1956-07-06 (66 y.o. F) Treating RN: Alycia Rossetti Primary Care Fernanda Twaddell: Ranae Plumber Other Clinician: Massie Kluver Referring Sourish Allender: Ranae Plumber Treating Verdie Wilms/Extender: Skipper Cliche in Treatment: 28 Vital Signs Time Taken: 09:05 Temperature (F): 98.4 Height (in): 63 Pulse (bpm): 75 Weight (lbs): 372 Respiratory Rate (breaths/min): 18 Body Mass Index (BMI): 65.9 Blood Pressure (mmHg): 129/72 Reference Range: 80 - 120 mg / dl Electronic Signature(s) Signed: 02/13/2022 12:06:53 PM By: Alycia Rossetti Entered By: Alycia Rossetti on 02/13/2022 12:06:53

## 2022-02-20 ENCOUNTER — Encounter: Payer: Medicare HMO | Admitting: Physician Assistant

## 2022-02-20 DIAGNOSIS — Q820 Hereditary lymphedema: Secondary | ICD-10-CM | POA: Diagnosis not present

## 2022-02-21 NOTE — Progress Notes (Addendum)
[name], [name] ([id number]) Visit Report for 02/20/2022 Chief Complaint Document Details Patient Name: [name], [name]. Date of Service: 02/20/2022 9:45 AM Medical Record Number: [id number] Patient Account Number: [id number] Date of Birth/Sex: January 18, 1956 (66 y.o. F) Treating RN: Cornell Barman Primary Care Provider: Ranae Plumber Other Clinician: Massie Kluver Referring Provider: Ranae Plumber Treating Provider/Extender: Skipper Cliche in Treatment: 29 Information Obtained from: Patient Chief Complaint Bilateral LE lymphedema with Left LE Ulcers Electronic Signature(s) Signed: 02/20/2022 10:11:36 AM By: Worthy Keeler PA-C Entered By: Worthy Keeler on 02/20/2022 10:11:36 Tucholski, [name] ([id number]) -------------------------------------------------------------------------------- Debridement Details Patient Name: [name] Date of Service: 02/20/2022 9:45 AM Medical Record Number: [id number] Patient Account Number: [id number] Date of Birth/Sex: 06/17/1956 (66 y.o. F) Treating RN: Cornell Barman Primary Care Provider: Ranae Plumber Other Clinician: Massie Kluver Referring Provider: Ranae Plumber Treating Provider/Extender: Skipper Cliche in Treatment: 29 Debridement Performed for Wound #10 Left,Medial,Anterior Lower Leg Assessment: Performed By: Physician Tommie Sams., PA-C Debridement Type: Debridement Level of Consciousness (Pre- Awake and Alert procedure): Pre-procedure Verification/Time Out Yes - 10:41 Taken: Start Time: 10:41 Total Area Debrided (L x W): 4.2 (cm) x 1 (cm) = 4.2 (cm) Tissue and other material Viable, Non-Viable, Slough, Subcutaneous, Slough debrided: Level: Skin/Subcutaneous Tissue Debridement Description: Excisional Instrument: Curette Bleeding: Minimum Hemostasis Achieved: Pressure End Time: 10:43 Response to Treatment: Procedure was tolerated well Level of Consciousness (Post- Awake and Alert procedure): Post  Debridement Measurements of Total Wound Length: (cm) 4.2 Width: (cm) 1 Depth: (cm) 0.2 Volume: (cm) 0.66 Character of Wound/Ulcer Post Debridement: Stable Post Procedure Diagnosis Same as Pre-procedure Electronic Signature(s) Signed: 02/20/2022 5:01:39 PM By: Gretta Cool, BSN, RN, CWS, Kim RN, BSN Signed: 02/21/2022 1:47:09 PM By: Massie Kluver Signed: 02/22/2022 5:21:19 PM By: Worthy Keeler PA-C Entered By: Massie Kluver on 02/20/2022 10:43:14 Belanger, [name] ([id number]) -------------------------------------------------------------------------------- HPI Details Patient Name: [name], [name] Date of Service: 02/20/2022 9:45 AM Medical Record Number: [id number] Patient Account Number: [id number] Date of Birth/Sex: 02/17/1956 (66 y.o. F) Treating RN: Cornell Barman Primary Care Provider: Ranae Plumber Other Clinician: Massie Kluver Referring Provider: Ranae Plumber Treating Provider/Extender: Skipper Cliche in Treatment: 29 History of Present Illness HPI Description: The patient is a 66 year old female with history of hypertension and a long-standing history of bilateral lower extremity lymphedema (first presented on 4/2) . She has had open ulcers in the past which have always responded to compression therapy. She had briefly been to a lymphedema clinic in the past which helped her at the time. this time around she stopped treatment of her lymphedema pumps approximately 2 weeks ago because of some pain in the knees and then noticed the right leg getting worse. She was seen by her PCP who put her on clindamycin 4 times a day 2 days ago. The patient has seen [facility] and Dr. Delana Meyer had seen her last year where a vascular study including venous and arterial duplex studies were within normal limits. he had recommended compression stockings and lymphedema pumps and the patient has been using this in about 2 weeks ago. She is known to be diabetic but in the past few time she's gone to her  primary care doctor her hemoglobin A1c has been normal. 02/11/2015 - after her last visit she took my advice and went to the ER regarding the progressive cellulitis of her right lower extremity and she was admitted between July 17 and 22nd. She received IV antibiotics and then was sent  home on a course of steroid-induced and oral antibiotics. She has improved much since then. 02/17/2015 -- she has been doing fine and the weeping of her legs has remarkably gone down. She has no fresh issues. READMISSION 01/15/18 This patient was given this clinic before most recently in 2016 seen by Dr. Con Memos. She has massive bilateral lymphedema and over the last 2 months this had weeping edema out of the left leg. She has compression pumps but her compliance with these has been minimal. She has [facility] they've been using TCA/ABDs/kerlix under an Ace wrap.she has had recent problems with cellulitis. She was apparently seen in the ER and 12/23/17 and given clindamycin. She was then followed by her primary doctor and given doxycycline and Keflex. The pain seems to have settled down. In April 2018 the patient had arterial studies done at [facility]. This showed triphasic waveforms throughout the right leg and mostly triphasic waveforms on the left except for monophasic at the posterior tibial artery distally. She was not felt to have evidence of right lower extremity arterial stenosis or significant problems on the left side. 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 She also had a right lower extremity venous Doppler in January 2018 this was limited by the patient's body habitus and lymphedema. Most of the proximal veins were not visualized The patient presents with an area of denuded skin on the anterior medial part of the left calf. There is weeping edema fluid here. 01/22/18; the patient has somewhat better edema control using her compression pumps twice a day and  as a result she has much better epithelialization on the left anterior calf area. Only a small open area remains. 01/29/18; the patient has been compliant with her compression pumps. Both the areas on her calf that healed. The remaining area on the left anterior leg is fully epithelialized Readmission: 02/20/2019 upon evaluation today patient presents for reevaluation due to issues that she is having with the bilateral lower extremities. She actually has wounds open on both legs. On the right she has an area in the crease of her leg on the right around the knee region which is actually draining quite a bit and actually has some fungal type appearance to it. She has been on nystatin powder that seems to have helped to some degree. In regard to the left lower extremity this is actually in the lower portion of her leg closer to the ankle and again is continuing to drain as well unfortunately. There does not appear to be any signs of active infection at this time which is good news. No fevers, chills, nausea, vomiting, or diarrhea. She tells me that since she was seen last year she is actually been doing quite well for the most part with regard to her lower extremities. Unfortunately she now is experiencing a little bit more drainage at this time. She is concerned about getting this under control so that it does not get significantly worse. 02/27/2019 on evaluation today patient appears to be doing somewhat better in regard to her bilateral lower extremity wounds. She has been tolerating the dressing changes without complication. Fortunately there is no signs of active infection at this point. No fevers, chills, nausea, vomiting, or diarrhea. She did get her dressing supplies which is excellent news she was extremely excited to get these. She also got paperwork from [employer] for their financial assistance program where they may be able to help her out in the  future if needed with supplies at  discounted prices. 03/06/2019 on evaluation today patient appears to be doing a little worse with regard to both areas of weeping on her bilateral lower extremities. This is around the right medial knee and just above the left ankle. With that being said she is unfortunately not doing as well as I would like to see. I feel like she may need to potentially go see someone at the lymphedema clinic as the wraps that she needs or even beyond what we can do here at the wound care center. She really does not have wounds she just has open areas of weeping that are causing some difficulty for her. Subsequently because of this and the moisture I am concerned about the potential for infection I am going to likely give her a prophylactic antibiotic today, Keflex, just to be on the safe side. Nonetheless again there is no obvious signs of active infection at this time. 03/13/2019 on evaluation today patient appears to be doing well with regard to her bilateral lower extremities where she has been weeping compared to even last week's evaluation. I see some areas of new skin growth which is excellent and overall I am very pleased with how things seem to be progressing. No fevers, chills, nausea, vomiting, or diarrhea. KERON, KOFFMAN ([id number]) 03/20/2019 on evaluation today patient unfortunately is continuing to have issues with significant edema of the left lower extremity. Her right side seems to be doing much better. Unfortunately her left side is showing increased weeping of the lower portion of her leg. This is quite unfortunate obviously we were hoping to get her into the lymphedema clinic they really do not seem to when I see her how if she is draining. Despite the fact this is really not wound related but more lymphedema weeping related. Nonetheless I do not know that this can be helpful for her to even go for that appointment since again I am not sure there is much that they would actually do at this  point. We may need to try a 4 layer compression wrap as best we can on her leg. She is on the Augmentin currently although I am still concerned about whether or not there could be potentially something going on infection wise I would obtain a culture though I understand is not the best being that is a surface culture I just 1 to make sure I do not seem to be missing anything. 03/27/2019 on evaluation today patient appears to be doing much better in regard to the left lower extremity compared to last week. Last week she had tremendous weeping which I think was subsequent to infection now she seems to be doing much better and very pleased. This is not completely healed but there is a lot of new skin growth and it has dried out quite a bit. Overall I think that we are doing well with how things are moving along at this time. No fevers, chills, nausea, vomiting, or diarrhea. 04/03/2019 evaluation today patient appears to be doing a little worse this week compared to last time I saw her. I think this may be due to the fact that she is having issues with not being able to sleep in her bed at least not until last night. She is therefore been in a lift chair and subsequently has also had issues with not been able to use her pumps since she could not get in bed. With that being said the patient overall  seems to be doing okay I do think I may want extend the antibiotic for a little bit longer at least until we can see if her edema and her weeping gets better and if it is then obviously I can always discontinue the antibiotics as of next week however I want her to continue to have it over the next week. 04/10/2019 on evaluation today patient unfortunately is still doing poorly with regard to her left lower extremity. Her right is all things considering doing fairly well. On the left however she continues to have spreading of the area of infection and weeping which appears to be even a larger surface area than  noted last week. She did have a positive culture for Pseudomonas in particular which seems to have been of concern she still has green/yellow discharge consistent with Pseudomonas and subsequently a tremendous amount of it. This has me obviously still concerned about the infection not really clearing up despite the fact that on culture it appears the Cipro should have been a good option for treating this. I think she may at this point need IV antibiotics since things are not doing better I do not want to get worse and cause sepsis. She is in agreement with the plan and believes as well that she likely does need to go to the hospital for IV vancomycin. Or something of the like depending on what the recommendation is from the ER. 04/17/2019 on evaluation today patient appears to be doing excellent in regard to her lower extremity on the left. She was in the hospital for several days from when I sent her last we saw her until just this past Tuesday. Fortunately her drainage is significantly improved and in fact is mostly clear. There is just a couple small areas that may still drain a little bit she states that the [facility] they prescribed for her at discharge she went picked up from pharmacy and got home but has not been able to find it since. She is looked everywhere. She is wondering if I will replace that for her today I will be more than happy to do that. 05/01/2019 on evaluation today patient actually appears to be doing quite well with regard to her lower extremities. She occasionally is having areas that will leak and then heal up mainly when a piece of the fibrotic skin pops off but fortunately she is not having any signs of active infection at this time. Overall she also really does not have any obvious weeping at this time. I do believe however she really needs some compression wraps and I think this may be a good time to get her back to the lymphedema clinic. 05/11/2019 on evaluation today  patient actually appears to be doing quite well with regard to her bilateral lower extremities. She occasionally will have a small area that we per another but in general seems to be completely healed which is great news. Overall very pleased with how everything seems to be progressing. She does have her appointment with lymphedema clinic on November 18. 05/25/2019 on evaluation today patient appears to be doing well with regard to her left lower extremity. I am very pleased in this regard. In regard to her right leg this actually did start draining more I think it is mainly due to the fact that her leg is more swollen. I am not seeing any obvious signs of infection at this time although that is definitely something were obviously acutely aware of simply due to  the fact that she had an issue not too far back with exactly this issue. Nonetheless I do feel like that lymphedema clinic would still be beneficial for her. I explained obviously if they are not able to do anything treatment wise on the right leg we could at least have them treat her left leg and then proceed from there. The patient is really in agreement with that plan. If they are able to do both as the drainage slows down that I would be happy to let them handle both. 06/01/2019 on evaluation today patient unfortunately appears to be doing worse with regard to her right lower extremity. The left lower extremity is still maintaining at this point. Unfortunately she has been having significantly increased pain over the past several days and has been experiencing as well increased swelling of the right lower extremity. I really do not know that I am seeing anything that appears to be obvious for infection at this point to be peripherally honest. With that being said the patient does seem to be having much more swelling that she is even experienced in the past and coupled with increased pain in her hip as well I am concerned that again she could  potentially have a DVT although I am not 100% sure of this. I think it something that may need to be checked out. We discussed the possibility of sending her for a DVT study through the hospital but unfortunately transportation is an issue if she does have a DVT I do not want her to wait days to be able to get in for that test however if she has this scheduled as an outpatient that is as fast that she will be able to get the test scheduled for transportation purposes. That will also fall on Thanksgiving so subsequently she did actually be looking at either Friday or even next week before we would know anything back from this. That is much too long in my opinion. Subsequent to the amount of discomfort she is experiencing the patient is actually okay with going to the ER for evaluation today. 06/12/2019 on evaluation today patient actually appears to be doing significantly better compared to last time I saw her. Following when I last saw her she was actually in the hospital from that Monday until the following Sunday almost 1 full week. She actually was placed on Keflex in the hospital following the time for her to be discharged and Dr. Steva Ready has recommended 2 times a day dosing of the Keflex for the next year in order to help with more prophylactic/preventative measures with regard to her developing cellulitis. Overall I think this sounds like an excellent plan. The patient unfortunately is good to have trouble being treated at lymphedema clinic due to the fact that she really cannot get up on the bed that they have there. They also state that they cannot manage her as long as she has anything draining at this point. Obviously that is somewhat unfortunate as she does need help with edema control but nonetheless we will have to do what we can for her outside of it sounds like the lymphedema clinic scenario at this point. 06/19/2019 on evaluation today patient appears to be doing fairly well with  regard to her bilateral lower extremities. She is not nearly as swollen and shows no signs of infection at this point. There is no evidence of cellulitis whatsoever. She also has no open wounds or draining at this point which is also good  news. No fever chills noted. She seems to be in very good spirits and in fact appears to be doing quite well. READMISSION 11/27/2019 KELLINA, DREESE ([id number]) This is a 66 year old woman that we have had in this clinic several times before including 2015, 16 and 19 and then most recently from 03/20/2019 through 06/19/2019 with bilateral lower extremity lymphedema. She has had previous arterial and reflux studies done years ago which were not all that remarkable. 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 She does have a positive family history and she had large legs starting may be in her 64s. She was recently in hospital from 10/20/2019 through 10/28/2019 with right leg cellulitis. She was given Ancef and clindamycin and then Zosyn when a culture showed Pseudomonas. At that time there was purulent drainage. She was followed by infectious disease Dr Steva Ready. The patient is now back at home. She has noted increased swelling in the right and no drainage in her right leg mostly on the posterior medial aspect in the calf area. She has not had pain or fever. She has literally been improved lysing above dressings because her at the area of this is far too large for standard compression. She has been wrapping the areas with sheets to resorptive pads. She is found these helped somewhat. She does have an appointment with the lymphedema clinic in [place] in late June. Past medical history includes bilateral lymphedema, hypertension, obstructive sleep apnea with CPAP. Recent hospitalization with apparently Pseudomonas cellulitis of the right lower leg 12/15/2019 upon evaluation today patient appears to be doing a  little bit worse in regard to her right lower extremity. Unfortunately she is having more weeping down in the lower portion of her leg. Fortunately there is no signs of active infection at this time. No fever chills noted. The patient states she is not having increased pain except for when she attempted to use the lymphedema pumps unfortunately she states that she did have pain when she did this. Otherwise we been using absorptive dressings of one type or another she is using diapers at home and then subsequently Ace wraps. In regard to the barrier cream we have discussed the possibility of derma cloud which she would like to try I do not have a problem with that. 12/22/2019 upon evaluation today patient actually appears to be doing better in regard to her leg ulcers at this point. Fortunately there does not appear to be any signs of active infection which is great news and I am extremely pleased with where things are progressing at this time. There is no sign of active infection currently. The patient is very pleased to see things doing so well. 12/29/2019 upon evaluation today patient appears to be doing a little bit better in regard to her weeping in general over her lower extremities. She does have some signs of mild erythema little bit more than what I noted last week or rather last visit. Nonetheless I think that my threshold for switching her antibiotics from Keflex to something else is very low at this point considering that she has had such severe infections in the past that seem to come almost out of nowhere. There is a little erythema and warmth noted of the lower portion of her leg compared to the upper which also makes me want to go ahead and address things more rapidly at this point. Likely I would switch out the Keflex for something like Levaquin ideally.  7/16; patient with severe bilateral lymphedema. She has superficial wounds albeit almost circumferential now on the left lateral lower  leg. This may be new from last time. Small area on the right anterior lower leg and then another area on the right medial lower leg and of pannus fold. She has been using various absorptive garments. She states she is using her compression pumps once a day occasionally twice. Culture from her last visit here was negative 01/29/2020 on evaluation today patient appears to be doing excellent at this point in regard to her legs with regard to infection I see no signs of active infection at this point. She still does have unfortunately areas of weeping this is minimal on the right now her left is actually significantly worse although I do not think it is as bad as last week with Dr. Dellia Nims saw her. She has been trying to pump and elevate her legs is much as possible. She has previously been on the Keflex and in the past for prevention that seems to do fairly well and likely can extend that today. 02/04/2020 on evaluation today patient appears to be doing better in regard to her legs bilaterally. Fortunately there is no signs of active infection at this time which is great news and overall she has less weeping on the left compared to the right and there is several spots where she is pretty much sealed up with no draining regions. Overall very pleased in this regard. 02/19/2020 on evaluation today patient appears to be doing very well in regard to her wounds currently. Fortunately there is no evidence of active infection overall very pleased with where things stand. She is significantly improved in regard to her edema I am extremely pleased in this regard she tells me that the popping no longer hurts and in fact she actually looks forward to it. 03/04/2020 on evaluation today patient appears to be doing excellent in regard to her lower extremities. Fortunately there is no signs of active infection at this time. No fevers, chills, nausea, vomiting, or diarrhea. 03/25/2020 on evaluation today patient appears to be  doing a little bit more poorly in regard to her legs at this point. She tells me that she is still continue to have issues with drainage and this has been a little bit worse she was getting ready to start taking the Keflex again but wanted to see me first. Fortunately there is no signs of active infection at this time. No fevers, chills, nausea, vomiting, or diarrhea. 04/15/2020 upon evaluation today patient appears to be doing somewhat poorly in regard to her right leg. She tells me she has been having more pain she has been taking the Keflex that was previously prescribed unfortunately that just does not seem to help with this. She was hoping that the pain on her right was actually coming from the fact that she was having issues with her wrap having gotten caught in her recliner. With that being said she tells me that she knew something was not right. Currently her right leg is warm to touch along with being erythematous all the way up to around at least mid thigh as far as I can see. The left leg does not appear to be doing that badly though there is increased weeping around the ankle region. 05/06/2020 on evaluation today patient appears to be doing much better than last time I saw her. She did go to the hospital where she was admitted for 2 days and treated  with antibiotic therapy. She was discharged with antibiotics as well and has done extremely well. I am extremely pleased with where things stand today. There is no signs of active infection at this time which is great news. 05/27/2020 upon evaluation today patient appears to be doing well with regard to her lower extremities bilaterally. She has just a very tiny area on the right leg which is opening on the left leg she is significantly improved though she still has several areas that do appear to be open this is minimal compared to what is been in the past. In general I am extremely pleased with where things stand today. The patient does tell  me she is not been using her pumps quite as much as she should be. I do believe that is 1 area she can definitely work on. She has had a lot going on including a Covid exposure and apparently also a outbreak of likely shingles. 06/24/2020 upon evaluation today patient appears to be doing well with regard to her legs in general although the left leg unfortunately is showing some signs of erythema she does have a little bit of increased weeping and to be honest I am concerned about infection here. I discussed that with her today and I think that we may need to address this sooner rather than later she has been taking Keflex she is not really certain that is been making a big improvement however. No fevers, chills, nausea, vomiting, or diarrhea. DESTANEY, SARKIS ([id number]) 06/30/2020 upon evaluation today patient's legs actually seem to be doing better in my opinion as compared to where they were last week. Fortunately there does not appear to be any signs of active infection. Her culture showed multiple organisms nothing predominate. With that being said the Levaquin seems to have done well I think she has improved since I last saw her as well. 07/14/2020 upon evaluation today patient appears to be doing actually better in regard to her lower extremities in my opinion. She has been tolerating the dressing changes without complication. Fortunately there is no signs of active infection at this time. 08/04/2020 upon evaluation today patient appears to be doing excellent in regard to her leg ulcers. Fortunately she has very little that is open at this point. This is great news. In fact I think that the Goldbond medicated powder has been excellent for her. It seems to have done the trick where we had tried several other things without as much success. Fortunately there is no evidence of active infection at this time. No fevers, chills, nausea, vomiting, or diarrhea. 09/01/2020 upon evaluation today patient  appears to be doing a little bit more poorly than the last time I saw her. She tells me right now that she has been having a lot of drainage compared to where things were previous which has unfortunately led to more irritation as well. She is concerned that this is leading to infection. Again based on what I am seeing today as well I am also concerned of the same to be honest. 09/15/2020 upon evaluation today patient appears to be doing well with regard to her legs compared to what I saw previous. Fortunately there does not appear to be any evidence of active infection at this time which is great news. At least not as badly as what it was previous. With that being said I do believe that the patient does need to have an extension of the antibiotics. This I believe will actually help her  more in the way of making sure this stays under control and does not worsen. That was the Bactrim DS. Readmission: 08/01/2021 upon evaluation today patient appears to be doing actually pretty well all things considered. Is actually been almost a year since have seen her last March. Fortunately I do not see any evidence of active infection locally nor systemically at this point. She does have a couple open areas on the left leg the right leg appears to be doing quite well. In general she has been in the hospital 3 times since I saw her a year ago all 4 cellulitis type issues except for the last 1 which was actually more related to congestive heart failure for that reason she is not using her lymphedema pumps at this point and that is probably the best idea. 08/08/2021 upon evaluation today patient appears to be doing well with regard to her legs all things considered her left leg may be a little bit more swollen based on what I see today. Fortunately I do not see any signs of active infection locally nor systemically at this time which is great news. No fevers, chills, nausea, vomiting, or diarrhea. 08/15/2021 upon  evaluation today patient actually appears to be doing excellent in regard to her wounds. She has been tolerating the dressing changes without complication. Fortunately I see no evidence of infection currently which is great news. No fevers, chills, nausea, vomiting, or diarrhea. 08/29/2021 upon evaluation patient appears to be doing excellent she in fact is almost completely healed. I am actually very pleased with where we stand today and I think that she is making wonderful progress she just has a small area of weeping on the right lateral leg everything else is pretty much completely healed which is also. 09/05/2021 upon evaluation today patient appears to be doing well with regard to her legs. She does have a small area of weeping on the right leg and there is a small area on the left leg as well. It is potentially something that may need to be addressed in the future more specifically but right now I think that there may have just been a little drainage here on the left. With all that being said I think that this still does not appear to be infected and overall I feel like she is doing quite well. That something we always have to keep a very close eye on as she can change very rapidly from okay to not okay. 09/12/2021 upon evaluation today patient appears to be doing a little bit worse in regard to her leg and actually somewhat concerned about the possibility of infection here. I discussed that with the patient. For that reason I am going to go ahead and see about getting her set up for a repeat prescription for the Bactrim. She is in agreement with that plan. 3/14; patient presents for follow-up. She has been using silver alginate with dressing changes. She is still taking Bactrim prescribed at last clinic visit. She has no issues or complaints today. She has lymphedema pumps however does not use these. 09/26/2020 upon evaluation today patient appears to actually be doing well in regard to her right  leg I am pleased in that regard. Unfortunately she has new areas on her left leg that are open at this point that I do need to be addressed. I am also concerned about the warmth around one of the wounds on the more anterior side. I think this is something that we may  need to culture today potentially requiring antibiotics going forward. 10/03/2021 upon evaluation today patient unfortunately is not doing nearly as well as she was even last week with her wounds. She is having some issues here with increased cellulitis of the left lower extremity unfortunately. I did prescribe Augmentin for her eczema prescribed last week unfortunately she never actually received this prescription. The pharmacy stated they never got it. We double checked with them today they still did not receive it. I am not sure what happened in that regard. With that being said the patient is in good spirits today she does not appear to be as sick as where she normally is when the cellulitis gets significantly worse as it has in the past. Nonetheless the leg is much more red not just around the ankle where I was seeing at last week on Tuesday but rather now this is going all the way up to almost her knee and is definitely hot to touch compared to the right leg. Nonetheless I feel like she does need to go to the ER for likely IV antibiotics. 10-10-2021 upon evaluation today patient appears to be doing well with regard to her wound. She has been tolerating the dressing changes that appears to be doing much better. After I saw her last week she did go to the ER they admitted her to the hospital and gave her IV antibiotics she fortunately is doing significantly better. Overall I am extremely pleased with where things stand today. 10-17-2021 upon evaluation today patient appears to be doing well with regard to her wound. In fact this is looking better and her leg is looking better but she still does have some areas here of erythema. We will  continue to address this as soon as possible in my opinion. I do believe she may require some debridement and what appears to be a more defined wound on her left leg at this point. 10-24-2021 upon evaluation patient is definitely showing signs of improvement which is great news. I do not see any evidence of active infection locally or systemically which is great news as well. No fevers, chills, nausea, vomiting, or diarrhea. 10-31-2021. Upon evaluation today patient's leg actually feels better from an infection and wound care perspective. I am actually very pleased in this regard. Unfortunately the biggest issue that I see at this time is that the patient is having a significant issue with her breathing. I did put her on the pulse ox machine to check her oxygen saturation and it was pretty much at 100% which is good but she tells me that when she is walking Lingard, Ellory C. ([id number]) this is much more difficult for her. She also tells me that when she is trying to bend over to perform her dressing changes she is so short of breath due to the swelling and fluid in her abdominal area that she is just not been able to do this. She is tearful and very concerned today to be honest. I am truly sorry to see her this way I know she is really having a hard time at the moment. 11-14-2021 upon evaluation patient actually appears to be doing significantly better compared to when I last saw her. She was actually admitted to the hospital I believe this for 6 days total after I sent her on 10-31-2021. Subsequently they actually pulled off greater than 50 pounds of fluid she tells me she feels significantly better her legs are not as tight she actually has some  play in the tissue and it is obvious that she is doing much better just from a mobility standpoint as well as a breathing standpoint. Overall I am extremely happy for her and how she is doing the leg also looks much better on the left. 11-21-2021 upon evaluation  today patient appears to be doing well although it does appear that she is going require some sharp debridement the wound is very dry this is the opposite of what its been she was having so much weeping that it was staying extremely wet. Nonetheless we do need to see about going ahead and debriding the wound today. 11-28-2021 upon evaluation today patient appears to be doing well with regard to her wound I definitely see signs of things continuing to improve which is great news. I do not see any evidence of active infection locally or systemically also great news. 12-05-2021 upon evaluation today patient appears to be doing well with regard to her wound. She has been tolerating the dressing changes. Fortunately there does not appear to be any signs of active infection locally or systemically at this time. No fevers, chills, nausea, vomiting, or diarrhea. 12-12-2021 upon evaluation patient appears to be doing well with regard to her wound this is actually measuring smaller and looking much better. Fortunately I do not see any signs of active infection locally or systemically at this time which is excellent news. 6/13; small wound area in the folds of her lymphedema just above the left ankle. Fortunately the area looks quite good. She has been using silver alginate ABDs and an Ace wrap which she is able to change her self. She is not using her compression pumps out of fear that this could contribute to heart failure. 12-26-2021 upon evaluation patient's wounds are actually showing signs of excellent improvement. I am very pleased with where things stand. Overall I think that she has been making excellent progress and I think we are very close to complete resolution which is great news. 01-11-2022 upon evaluation today patient appears to be doing well currently in regard to her legs in general although on the left leg she does have 1 area that still irritated and inflamed on the anterior portion of her leg.  Fortunately I do not see any signs of systemic infection locally there might be some infection going on here however which is my biggest concern. Fortunately I do not see any evidence though of this spreading to any other location. 01-16-2022 upon evaluation today patient has actually showing signs of excellent improvement. Fortunately I do not see any evidence of infection locally or systemically which is great news and overall I am very pleased with where we stand at this point. 01-25-2022 upon evaluation today patient actually appears to be doing decently well in regard to her legs. She has been tolerating the dressing changes without complication. Fortunately there does not appear to be any evidence of active infection locally or systemically which is great news and overall I am extremely pleased with where we stand at this point. 02-13-2022 upon evaluation today patient appears to be doing well currently in regard to her wound which is actually showing signs of significant improvement. Fortunately I do not see any evidence of active infection locally or systemically at this time. No fevers, chills, nausea, vomiting, or diarrhea. 02-20-2022 upon evaluation today patient appears to be doing well currently in regard to her wound. She has been tolerating the dressing changes without complication. Fortunately there does not appear to  be any signs of active infection locally or systemically at this time. No fevers, chills, nausea, vomiting, or diarrhea. Electronic Signature(s) Signed: 02/20/2022 10:48:48 AM By: Worthy Keeler PA-C Entered By: Worthy Keeler on 02/20/2022 10:48:48 [name] ([id number]) -------------------------------------------------------------------------------- Physical Exam Details Patient Name: SMT., LODER C. Date of Service: 02/20/2022 9:45 AM Medical Record Number: [id number] Patient Account Number: [id number] Date of Birth/Sex: Nov 25, 1955 (66 y.o. F) Treating RN:  Cornell Barman Primary Care Provider: Ranae Plumber Other Clinician: Massie Kluver Referring Provider: Ranae Plumber Treating Provider/Extender: Skipper Cliche in Treatment: 29 Constitutional Well-nourished and well-hydrated in no acute distress. Respiratory normal breathing without difficulty. Psychiatric this patient is able to make decisions and demonstrates good insight into disease process. Alert and Oriented x 3. pleasant and cooperative. Notes Upon inspection patient's wound bed actually showed signs of good granulation and epithelization at this point. Fortunately I do not see any evidence of active infection which is great news and overall I do feel like she is doing well there is some need for a little bit of sharp debridement this was a very minimal area but nonetheless I was able to clearway some of the necrotic debris which she tolerated today without complication. Electronic Signature(s) Signed: 02/20/2022 10:49:12 AM By: Worthy Keeler PA-C Entered By: Worthy Keeler on 02/20/2022 10:49:11 Stordahl, [name] ([id number]) -------------------------------------------------------------------------------- Physician Orders Details Patient Name: [name] Date of Service: 02/20/2022 9:45 AM Medical Record Number: [id number] Patient Account Number: [id number] Date of Birth/Sex: 1955/09/11 (66 y.o. F) Treating RN: Cornell Barman Primary Care Provider: Ranae Plumber Other Clinician: Massie Kluver Referring Provider: Ranae Plumber Treating Provider/Extender: Skipper Cliche in Treatment: 29 Verbal / Phone Orders: No Diagnosis Coding ICD-10 Coding Code Description Q82.0 Hereditary lymphedema L97.811 Non-pressure chronic ulcer of other part of right lower leg limited to breakdown of skin L97.821 Non-pressure chronic ulcer of other part of left lower leg limited to breakdown of skin Follow-up Appointments o Return Appointment in 1 week. o Nurse Visit as  needed Bathing/ Shower/ Hygiene o Clean wound with Normal Saline or wound cleanser. o Wash wounds with antibacterial soap and water. o May shower; gently cleanse wound with antibacterial soap, rinse and pat dry prior to dressing wounds o No tub bath. Edema Control - Lymphedema / Segmental Compressive Device / Other o Ace wraps o Elevate, Exercise Daily and Avoid Standing for Long Periods of Time. o Elevate leg(s) parallel to the floor when sitting. o DO YOUR BEST to sleep in the bed at night. DO NOT sleep in your recliner. Long hours of sitting in a recliner leads to swelling of the legs and/or potential wounds on your backside. o Other: - use lymphedema pumps Non-Wound Condition Bilateral Lower Extremities o Cleanse affected area with antibacterial soap and water, o Apply appropriate compression. - ace wrap o Additional non-wound orders/instructions: - silver cell to open weeping areas with abd pad Medications-Please add to medication list. o P.O. Antibiotics - continue Doxycycline Wound Treatment Wound #10 - Lower Leg Wound Laterality: Left, Medial, Anterior Cleanser: Soap and Water (Generic) Discharge Instructions: Gently cleanse wound with antibacterial soap, rinse and pat dry prior to dressing wounds Primary Dressing: SILVERCEL Antimicrobial Alginate Dressing, 1x12 (in/in) (Generic) Secondary Dressing: ABD Pad 5x9 (in/in) (Generic) Discharge Instructions: Cover with ABD pad Secondary Dressing: Conforming Guaze Roll-Medium (Generic) Discharge Instructions: Apply Conforming Stretch Guaze Bandage as directed Secured With: ACE WRAP - 37M ACE Elastic Bandage With VELCRO Brand Closure, 4 (in) (  Generic) Electronic Signature(s) Signed: 02/22/2022 5:21:19 PM By: Worthy Keeler PA-C Previous Signature: 02/20/2022 10:50:54 AM Version By: Worthy Keeler PA-C Entered By: Worthy Keeler on 02/20/2022 10:52:30 Schindel, [name] ([id number]) SANITA, ESTRADA  ([id number]) -------------------------------------------------------------------------------- Problem List Details Patient Name: MADALYNE, HUSK. Date of Service: 02/20/2022 9:45 AM Medical Record Number: [id number] Patient Account Number: [id number] Date of Birth/Sex: 1955-08-03 (66 y.o. F) Treating RN: Cornell Barman Primary Care Provider: Ranae Plumber Other Clinician: Massie Kluver Referring Provider: Ranae Plumber Treating Provider/Extender: Skipper Cliche in Treatment: 29 Active Problems ICD-10 Encounter Code Description Active Date MDM Diagnosis Q82.0 Hereditary lymphedema 08/01/2021 No Yes L97.811 Non-pressure chronic ulcer of other part of right lower leg limited to 08/01/2021 No Yes breakdown of skin L97.821 Non-pressure chronic ulcer of other part of left lower leg limited to 08/01/2021 No Yes breakdown of skin Inactive Problems Resolved Problems Electronic Signature(s) Signed: 02/20/2022 10:11:33 AM By: Worthy Keeler PA-C Entered By: Worthy Keeler on 02/20/2022 10:11:33 Degregorio, [name] ([id number]) -------------------------------------------------------------------------------- Progress Note Details Patient Name: [name]. Date of Service: 02/20/2022 9:45 AM Medical Record Number: [id number] Patient Account Number: [id number] Date of Birth/Sex: [birth year] (66 y.o. F) Treating RN: Cornell Barman Primary Care Provider: Ranae Plumber Other Clinician: Massie Kluver Referring Provider: Ranae Plumber Treating Provider/Extender: Skipper Cliche in Treatment: 29 Subjective Chief Complaint Information obtained from Patient Bilateral LE lymphedema with Left LE Ulcers History of Present Illness (HPI) The patient is a 66 year old female with history of hypertension and a long-standing history of bilateral lower extremity lymphedema (first presented on 4/2) . She has had open ulcers in the past which have always responded to compression therapy. She had briefly  been to a lymphedema clinic in the past which helped her at the time. this time around she stopped treatment of her lymphedema pumps approximately 2 weeks ago because of some pain in the knees and then noticed the right leg getting worse. She was seen by her PCP who put her on clindamycin 4 times a day 2 days ago. The patient has seen [facility] and Dr. Delana Meyer had seen her last year where a vascular study including venous and arterial duplex studies were within normal limits. he had recommended compression stockings and lymphedema pumps and the patient has been using this in about 2 weeks ago. She is known to be diabetic but in the past few time she's gone to her primary care doctor her hemoglobin A1c has been normal. 02/11/2015 - after her last visit she took my advice and went to the ER regarding the progressive cellulitis of her right lower extremity and she was admitted between July 17 and 22nd. She received IV antibiotics and then was sent home on a course of steroid-induced and oral antibiotics. She has improved much since then. 02/17/2015 -- she has been doing fine and the weeping of her legs has remarkably gone down. She has no fresh issues. READMISSION 01/15/18 This patient was given this clinic before most recently in 2016 seen by Dr. Con Memos. She has massive bilateral lymphedema and over the last 2 months this had weeping edema out of the left leg. She has compression pumps but her compliance with these has been minimal. She has [facility] they've been using TCA/ABDs/kerlix under an Ace wrap.she has had recent problems with cellulitis. She was apparently seen in the ER and 12/23/17 and given clindamycin. She was then followed by her primary doctor and given doxycycline and Keflex.  The pain seems to have settled down. In April 2018 the patient had arterial studies done at [facility]. This showed triphasic waveforms throughout the right leg and mostly triphasic waveforms  on the left except for monophasic at the posterior tibial artery distally. She was not felt to have evidence of right lower extremity arterial stenosis or significant problems on the left side. 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 She also had a right lower extremity venous Doppler in January 2018 this was limited by the patient's body habitus and lymphedema. Most of the proximal veins were not visualized The patient presents with an area of denuded skin on the anterior medial part of the left calf. There is weeping edema fluid here. 01/22/18; the patient has somewhat better edema control using her compression pumps twice a day and as a result she has much better epithelialization on the left anterior calf area. Only a small open area remains. 01/29/18; the patient has been compliant with her compression pumps. Both the areas on her calf that healed. The remaining area on the left anterior leg is fully epithelialized Readmission: 02/20/2019 upon evaluation today patient presents for reevaluation due to issues that she is having with the bilateral lower extremities. She actually has wounds open on both legs. On the right she has an area in the crease of her leg on the right around the knee region which is actually draining quite a bit and actually has some fungal type appearance to it. She has been on nystatin powder that seems to have helped to some degree. In regard to the left lower extremity this is actually in the lower portion of her leg closer to the ankle and again is continuing to drain as well unfortunately. There does not appear to be any signs of active infection at this time which is good news. No fevers, chills, nausea, vomiting, or diarrhea. She tells me that since she was seen last year she is actually been doing quite well for the most part with regard to her lower extremities. Unfortunately she now is experiencing a little bit more drainage at this time.  She is concerned about getting this under control so that it does not get significantly worse. 02/27/2019 on evaluation today patient appears to be doing somewhat better in regard to her bilateral lower extremity wounds. She has been tolerating the dressing changes without complication. Fortunately there is no signs of active infection at this point. No fevers, chills, nausea, vomiting, or diarrhea. She did get her dressing supplies which is excellent news she was extremely excited to get these. She also got paperwork from [employer] for their financial assistance program where they may be able to help her out in the future if needed with supplies at discounted prices. 03/06/2019 on evaluation today patient appears to be doing a little worse with regard to both areas of weeping on her bilateral lower extremities. This is around the right medial knee and just above the left ankle. With that being said she is unfortunately not doing as well as I would like to see. I feel like she may need to potentially go see someone at the lymphedema clinic as the wraps that she needs or even beyond what we can do here at the wound care center. She really does not have wounds she just has open areas of weeping that are causing some difficulty for her. Subsequently because of this and the moisture I am concerned about the  potential for infection I am going to likely give her a prophylactic antibiotic today, Keflex, just to be on the safe side. Nonetheless again there is no obvious signs of active infection at this time. BEYA, TIPPS ([id number]) 03/13/2019 on evaluation today patient appears to be doing well with regard to her bilateral lower extremities where she has been weeping compared to even last week's evaluation. I see some areas of new skin growth which is excellent and overall I am very pleased with how things seem to be progressing. No fevers, chills, nausea, vomiting, or diarrhea. 03/20/2019 on evaluation today  patient unfortunately is continuing to have issues with significant edema of the left lower extremity. Her right side seems to be doing much better. Unfortunately her left side is showing increased weeping of the lower portion of her leg. This is quite unfortunate obviously we were hoping to get her into the lymphedema clinic they really do not seem to when I see her how if she is draining. Despite the fact this is really not wound related but more lymphedema weeping related. Nonetheless I do not know that this can be helpful for her to even go for that appointment since again I am not sure there is much that they would actually do at this point. We may need to try a 4 layer compression wrap as best we can on her leg. She is on the Augmentin currently although I am still concerned about whether or not there could be potentially something going on infection wise I would obtain a culture though I understand is not the best being that is a surface culture I just 1 to make sure I do not seem to be missing anything. 03/27/2019 on evaluation today patient appears to be doing much better in regard to the left lower extremity compared to last week. Last week she had tremendous weeping which I think was subsequent to infection now she seems to be doing much better and very pleased. This is not completely healed but there is a lot of new skin growth and it has dried out quite a bit. Overall I think that we are doing well with how things are moving along at this time. No fevers, chills, nausea, vomiting, or diarrhea. 04/03/2019 evaluation today patient appears to be doing a little worse this week compared to last time I saw her. I think this may be due to the fact that she is having issues with not being able to sleep in her bed at least not until last night. She is therefore been in a lift chair and subsequently has also had issues with not been able to use her pumps since she could not get in bed. With that being  said the patient overall seems to be doing okay I do think I may want extend the antibiotic for a little bit longer at least until we can see if her edema and her weeping gets better and if it is then obviously I can always discontinue the antibiotics as of next week however I want her to continue to have it over the next week. 04/10/2019 on evaluation today patient unfortunately is still doing poorly with regard to her left lower extremity. Her right is all things considering doing fairly well. On the left however she continues to have spreading of the area of infection and weeping which appears to be even a larger surface area than noted last week. She did have a positive culture for Pseudomonas in particular which  seems to have been of concern she still has green/yellow discharge consistent with Pseudomonas and subsequently a tremendous amount of it. This has me obviously still concerned about the infection not really clearing up despite the fact that on culture it appears the Cipro should have been a good option for treating this. I think she may at this point need IV antibiotics since things are not doing better I do not want to get worse and cause sepsis. She is in agreement with the plan and believes as well that she likely does need to go to the hospital for IV vancomycin. Or something of the like depending on what the recommendation is from the ER. 04/17/2019 on evaluation today patient appears to be doing excellent in regard to her lower extremity on the left. She was in the hospital for several days from when I sent her last we saw her until just this past Tuesday. Fortunately her drainage is significantly improved and in fact is mostly clear. There is just a couple small areas that may still drain a little bit she states that the [facility] they prescribed for her at discharge she went picked up from pharmacy and got home but has not been able to find it since. She is looked everywhere. She is  wondering if I will replace that for her today I will be more than happy to do that. 05/01/2019 on evaluation today patient actually appears to be doing quite well with regard to her lower extremities. She occasionally is having areas that will leak and then heal up mainly when a piece of the fibrotic skin pops off but fortunately she is not having any signs of active infection at this time. Overall she also really does not have any obvious weeping at this time. I do believe however she really needs some compression wraps and I think this may be a good time to get her back to the lymphedema clinic. 05/11/2019 on evaluation today patient actually appears to be doing quite well with regard to her bilateral lower extremities. She occasionally will have a small area that we per another but in general seems to be completely healed which is great news. Overall very pleased with how everything seems to be progressing. She does have her appointment with lymphedema clinic on November 18. 05/25/2019 on evaluation today patient appears to be doing well with regard to her left lower extremity. I am very pleased in this regard. In regard to her right leg this actually did start draining more I think it is mainly due to the fact that her leg is more swollen. I am not seeing any obvious signs of infection at this time although that is definitely something were obviously acutely aware of simply due to the fact that she had an issue not too far back with exactly this issue. Nonetheless I do feel like that lymphedema clinic would still be beneficial for her. I explained obviously if they are not able to do anything treatment wise on the right leg we could at least have them treat her left leg and then proceed from there. The patient is really in agreement with that plan. If they are able to do both as the drainage slows down that I would be happy to let them handle both. 06/01/2019 on evaluation today patient  unfortunately appears to be doing worse with regard to her right lower extremity. The left lower extremity is still maintaining at this point. Unfortunately she has been having significantly increased pain  over the past several days and has been experiencing as well increased swelling of the right lower extremity. I really do not know that I am seeing anything that appears to be obvious for infection at this point to be peripherally honest. With that being said the patient does seem to be having much more swelling that she is even experienced in the past and coupled with increased pain in her hip as well I am concerned that again she could potentially have a DVT although I am not 100% sure of this. I think it something that may need to be checked out. We discussed the possibility of sending her for a DVT study through the hospital but unfortunately transportation is an issue if she does have a DVT I do not want her to wait days to be able to get in for that test however if she has this scheduled as an outpatient that is as fast that she will be able to get the test scheduled for transportation purposes. That will also fall on Thanksgiving so subsequently she did actually be looking at either Friday or even next week before we would know anything back from this. That is much too long in my opinion. Subsequent to the amount of discomfort she is experiencing the patient is actually okay with going to the ER for evaluation today. 06/12/2019 on evaluation today patient actually appears to be doing significantly better compared to last time I saw her. Following when I last saw her she was actually in the hospital from that Monday until the following Sunday almost 1 full week. She actually was placed on Keflex in the hospital following the time for her to be discharged and Dr. Steva Ready has recommended 2 times a day dosing of the Keflex for the next year in order to help with more prophylactic/preventative  measures with regard to her developing cellulitis. Overall I think this sounds like an excellent plan. The patient unfortunately is good to have trouble being treated at lymphedema clinic due to the fact that she really cannot get up on the bed that they have there. They also state that they cannot manage her as long as she has anything draining at this point. Obviously that is somewhat unfortunate as she does need help with edema control but nonetheless we will have to do what we can for her outside of it sounds like the lymphedema clinic scenario at this point. 06/19/2019 on evaluation today patient appears to be doing fairly well with regard to her bilateral lower extremities. She is not nearly as swollen Budde, Ever C. ([id number]) and shows no signs of infection at this point. There is no evidence of cellulitis whatsoever. She also has no open wounds or draining at this point which is also good news. No fever chills noted. She seems to be in very good spirits and in fact appears to be doing quite well. READMISSION 11/27/2019 This is a 66 year old woman that we have had in this clinic several times before including 2015, 16 and 19 and then most recently from 03/20/2019 through 06/19/2019 with bilateral lower extremity lymphedema. She has had previous arterial and reflux studies done years ago which were not all that remarkable. 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 She does have a positive family history and she had large legs starting may be in her 78s. She was recently in hospital from 10/20/2019 through 10/28/2019 with right leg cellulitis. She was given Ancef and  clindamycin and then Zosyn when a culture showed Pseudomonas. At that time there was purulent drainage. She was followed by infectious disease Dr Steva Ready. The patient is now back at home. She has noted increased swelling in the right and no drainage in her right leg mostly  on the posterior medial aspect in the calf area. She has not had pain or fever. She has literally been improved lysing above dressings because her at the area of this is far too large for standard compression. She has been wrapping the areas with sheets to resorptive pads. She is found these helped somewhat. She does have an appointment with the lymphedema clinic in [place] in late June. Past medical history includes bilateral lymphedema, hypertension, obstructive sleep apnea with CPAP. Recent hospitalization with apparently Pseudomonas cellulitis of the right lower leg 12/15/2019 upon evaluation today patient appears to be doing a little bit worse in regard to her right lower extremity. Unfortunately she is having more weeping down in the lower portion of her leg. Fortunately there is no signs of active infection at this time. No fever chills noted. The patient states she is not having increased pain except for when she attempted to use the lymphedema pumps unfortunately she states that she did have pain when she did this. Otherwise we been using absorptive dressings of one type or another she is using diapers at home and then subsequently Ace wraps. In regard to the barrier cream we have discussed the possibility of derma cloud which she would like to try I do not have a problem with that. 12/22/2019 upon evaluation today patient actually appears to be doing better in regard to her leg ulcers at this point. Fortunately there does not appear to be any signs of active infection which is great news and I am extremely pleased with where things are progressing at this time. There is no sign of active infection currently. The patient is very pleased to see things doing so well. 12/29/2019 upon evaluation today patient appears to be doing a little bit better in regard to her weeping in general over her lower extremities. She does have some signs of mild erythema little bit more than what I noted last week  or rather last visit. Nonetheless I think that my threshold for switching her antibiotics from Keflex to something else is very low at this point considering that she has had such severe infections in the past that seem to come almost out of nowhere. There is a little erythema and warmth noted of the lower portion of her leg compared to the upper which also makes me want to go ahead and address things more rapidly at this point. Likely I would switch out the Keflex for something like Levaquin ideally. 7/16; patient with severe bilateral lymphedema. She has superficial wounds albeit almost circumferential now on the left lateral lower leg. This may be new from last time. Small area on the right anterior lower leg and then another area on the right medial lower leg and of pannus fold. She has been using various absorptive garments. She states she is using her compression pumps once a day occasionally twice. Culture from her last visit here was negative 01/29/2020 on evaluation today patient appears to be doing excellent at this point in regard to her legs with regard to infection I see no signs of active infection at this point. She still does have unfortunately areas of weeping this is minimal on the right now her left is actually  significantly worse although I do not think it is as bad as last week with Dr. Dellia Nims saw her. She has been trying to pump and elevate her legs is much as possible. She has previously been on the Keflex and in the past for prevention that seems to do fairly well and likely can extend that today. 02/04/2020 on evaluation today patient appears to be doing better in regard to her legs bilaterally. Fortunately there is no signs of active infection at this time which is great news and overall she has less weeping on the left compared to the right and there is several spots where she is pretty much sealed up with no draining regions. Overall very pleased in this regard. 02/19/2020 on  evaluation today patient appears to be doing very well in regard to her wounds currently. Fortunately there is no evidence of active infection overall very pleased with where things stand. She is significantly improved in regard to her edema I am extremely pleased in this regard she tells me that the popping no longer hurts and in fact she actually looks forward to it. 03/04/2020 on evaluation today patient appears to be doing excellent in regard to her lower extremities. Fortunately there is no signs of active infection at this time. No fevers, chills, nausea, vomiting, or diarrhea. 03/25/2020 on evaluation today patient appears to be doing a little bit more poorly in regard to her legs at this point. She tells me that she is still continue to have issues with drainage and this has been a little bit worse she was getting ready to start taking the Keflex again but wanted to see me first. Fortunately there is no signs of active infection at this time. No fevers, chills, nausea, vomiting, or diarrhea. 04/15/2020 upon evaluation today patient appears to be doing somewhat poorly in regard to her right leg. She tells me she has been having more pain she has been taking the Keflex that was previously prescribed unfortunately that just does not seem to help with this. She was hoping that the pain on her right was actually coming from the fact that she was having issues with her wrap having gotten caught in her recliner. With that being said she tells me that she knew something was not right. Currently her right leg is warm to touch along with being erythematous all the way up to around at least mid thigh as far as I can see. The left leg does not appear to be doing that badly though there is increased weeping around the ankle region. 05/06/2020 on evaluation today patient appears to be doing much better than last time I saw her. She did go to the hospital where she was admitted for 2 days and treated with  antibiotic therapy. She was discharged with antibiotics as well and has done extremely well. I am extremely pleased with where things stand today. There is no signs of active infection at this time which is great news. 05/27/2020 upon evaluation today patient appears to be doing well with regard to her lower extremities bilaterally. She has just a very tiny area on the right leg which is opening on the left leg she is significantly improved though she still has several areas that do appear to be open this is minimal compared to what is been in the past. In general I am extremely pleased with where things stand today. The patient does tell me she is not been using her pumps quite as much as she  should be. I do believe that is 1 area she can definitely work on. She has had a lot going on including a Covid exposure and apparently also a outbreak of likely shingles. KASIE, LECCESE ([id number]) 06/24/2020 upon evaluation today patient appears to be doing well with regard to her legs in general although the left leg unfortunately is showing some signs of erythema she does have a little bit of increased weeping and to be honest I am concerned about infection here. I discussed that with her today and I think that we may need to address this sooner rather than later she has been taking Keflex she is not really certain that is been making a big improvement however. No fevers, chills, nausea, vomiting, or diarrhea. 06/30/2020 upon evaluation today patient's legs actually seem to be doing better in my opinion as compared to where they were last week. Fortunately there does not appear to be any signs of active infection. Her culture showed multiple organisms nothing predominate. With that being said the Levaquin seems to have done well I think she has improved since I last saw her as well. 07/14/2020 upon evaluation today patient appears to be doing actually better in regard to her lower extremities in my opinion.  She has been tolerating the dressing changes without complication. Fortunately there is no signs of active infection at this time. 08/04/2020 upon evaluation today patient appears to be doing excellent in regard to her leg ulcers. Fortunately she has very little that is open at this point. This is great news. In fact I think that the Goldbond medicated powder has been excellent for her. It seems to have done the trick where we had tried several other things without as much success. Fortunately there is no evidence of active infection at this time. No fevers, chills, nausea, vomiting, or diarrhea. 09/01/2020 upon evaluation today patient appears to be doing a little bit more poorly than the last time I saw her. She tells me right now that she has been having a lot of drainage compared to where things were previous which has unfortunately led to more irritation as well. She is concerned that this is leading to infection. Again based on what I am seeing today as well I am also concerned of the same to be honest. 09/15/2020 upon evaluation today patient appears to be doing well with regard to her legs compared to what I saw previous. Fortunately there does not appear to be any evidence of active infection at this time which is great news. At least not as badly as what it was previous. With that being said I do believe that the patient does need to have an extension of the antibiotics. This I believe will actually help her more in the way of making sure this stays under control and does not worsen. That was the Bactrim DS. Readmission: 08/01/2021 upon evaluation today patient appears to be doing actually pretty well all things considered. Is actually been almost a year since have seen her last March. Fortunately I do not see any evidence of active infection locally nor systemically at this point. She does have a couple open areas on the left leg the right leg appears to be doing quite well. In general she has  been in the hospital 3 times since I saw her a year ago all 4 cellulitis type issues except for the last 1 which was actually more related to congestive heart failure for that reason she is not using her  lymphedema pumps at this point and that is probably the best idea. 08/08/2021 upon evaluation today patient appears to be doing well with regard to her legs all things considered her left leg may be a little bit more swollen based on what I see today. Fortunately I do not see any signs of active infection locally nor systemically at this time which is great news. No fevers, chills, nausea, vomiting, or diarrhea. 08/15/2021 upon evaluation today patient actually appears to be doing excellent in regard to her wounds. She has been tolerating the dressing changes without complication. Fortunately I see no evidence of infection currently which is great news. No fevers, chills, nausea, vomiting, or diarrhea. 08/29/2021 upon evaluation patient appears to be doing excellent she in fact is almost completely healed. I am actually very pleased with where we stand today and I think that she is making wonderful progress she just has a small area of weeping on the right lateral leg everything else is pretty much completely healed which is also. 09/05/2021 upon evaluation today patient appears to be doing well with regard to her legs. She does have a small area of weeping on the right leg and there is a small area on the left leg as well. It is potentially something that may need to be addressed in the future more specifically but right now I think that there may have just been a little drainage here on the left. With all that being said I think that this still does not appear to be infected and overall I feel like she is doing quite well. That something we always have to keep a very close eye on as she can change very rapidly from okay to not okay. 09/12/2021 upon evaluation today patient appears to be doing a little  bit worse in regard to her leg and actually somewhat concerned about the possibility of infection here. I discussed that with the patient. For that reason I am going to go ahead and see about getting her set up for a repeat prescription for the Bactrim. She is in agreement with that plan. 3/14; patient presents for follow-up. She has been using silver alginate with dressing changes. She is still taking Bactrim prescribed at last clinic visit. She has no issues or complaints today. She has lymphedema pumps however does not use these. 09/26/2020 upon evaluation today patient appears to actually be doing well in regard to her right leg I am pleased in that regard. Unfortunately she has new areas on her left leg that are open at this point that I do need to be addressed. I am also concerned about the warmth around one of the wounds on the more anterior side. I think this is something that we may need to culture today potentially requiring antibiotics going forward. 10/03/2021 upon evaluation today patient unfortunately is not doing nearly as well as she was even last week with her wounds. She is having some issues here with increased cellulitis of the left lower extremity unfortunately. I did prescribe Augmentin for her eczema prescribed last week unfortunately she never actually received this prescription. The pharmacy stated they never got it. We double checked with them today they still did not receive it. I am not sure what happened in that regard. With that being said the patient is in good spirits today she does not appear to be as sick as where she normally is when the cellulitis gets significantly worse as it has in the past. Nonetheless the leg  is much more red not just around the ankle where I was seeing at last week on Tuesday but rather now this is going all the way up to almost her knee and is definitely hot to touch compared to the right leg. Nonetheless I feel like she does need to go to the  ER for likely IV antibiotics. 10-10-2021 upon evaluation today patient appears to be doing well with regard to her wound. She has been tolerating the dressing changes that appears to be doing much better. After I saw her last week she did go to the ER they admitted her to the hospital and gave her IV antibiotics she fortunately is doing significantly better. Overall I am extremely pleased with where things stand today. 10-17-2021 upon evaluation today patient appears to be doing well with regard to her wound. In fact this is looking better and her leg is looking better but she still does have some areas here of erythema. We will continue to address this as soon as possible in my opinion. I do believe she may require some debridement and what appears to be a more defined wound on her left leg at this point. 10-24-2021 upon evaluation patient is definitely showing signs of improvement which is great news. I do not see any evidence of active infection Maxim, [place]. ([id number]) locally or systemically which is great news as well. No fevers, chills, nausea, vomiting, or diarrhea. 10-31-2021. Upon evaluation today patient's leg actually feels better from an infection and wound care perspective. I am actually very pleased in this regard. Unfortunately the biggest issue that I see at this time is that the patient is having a significant issue with her breathing. I did put her on the pulse ox machine to check her oxygen saturation and it was pretty much at 100% which is good but she tells me that when she is walking this is much more difficult for her. She also tells me that when she is trying to bend over to perform her dressing changes she is so short of breath due to the swelling and fluid in her abdominal area that she is just not been able to do this. She is tearful and very concerned today to be honest. I am truly sorry to see her this way I know she is really having a hard time at the moment. 11-14-2021  upon evaluation patient actually appears to be doing significantly better compared to when I last saw her. She was actually admitted to the hospital I believe this for 6 days total after I sent her on 10-31-2021. Subsequently they actually pulled off greater than 50 pounds of fluid she tells me she feels significantly better her legs are not as tight she actually has some play in the tissue and it is obvious that she is doing much better just from a mobility standpoint as well as a breathing standpoint. Overall I am extremely happy for her and how she is doing the leg also looks much better on the left. 11-21-2021 upon evaluation today patient appears to be doing well although it does appear that she is going require some sharp debridement the wound is very dry this is the opposite of what its been she was having so much weeping that it was staying extremely wet. Nonetheless we do need to see about going ahead and debriding the wound today. 11-28-2021 upon evaluation today patient appears to be doing well with regard to her wound I definitely see signs of things  continuing to improve which is great news. I do not see any evidence of active infection locally or systemically also great news. 12-05-2021 upon evaluation today patient appears to be doing well with regard to her wound. She has been tolerating the dressing changes. Fortunately there does not appear to be any signs of active infection locally or systemically at this time. No fevers, chills, nausea, vomiting, or diarrhea. 12-12-2021 upon evaluation patient appears to be doing well with regard to her wound this is actually measuring smaller and looking much better. Fortunately I do not see any signs of active infection locally or systemically at this time which is excellent news. 6/13; small wound area in the folds of her lymphedema just above the left ankle. Fortunately the area looks quite good. She has been using silver alginate ABDs and an Ace  wrap which she is able to change her self. She is not using her compression pumps out of fear that this could contribute to heart failure. 12-26-2021 upon evaluation patient's wounds are actually showing signs of excellent improvement. I am very pleased with where things stand. Overall I think that she has been making excellent progress and I think we are very close to complete resolution which is great news. 01-11-2022 upon evaluation today patient appears to be doing well currently in regard to her legs in general although on the left leg she does have 1 area that still irritated and inflamed on the anterior portion of her leg. Fortunately I do not see any signs of systemic infection locally there might be some infection going on here however which is my biggest concern. Fortunately I do not see any evidence though of this spreading to any other location. 01-16-2022 upon evaluation today patient has actually showing signs of excellent improvement. Fortunately I do not see any evidence of infection locally or systemically which is great news and overall I am very pleased with where we stand at this point. 01-25-2022 upon evaluation today patient actually appears to be doing decently well in regard to her legs. She has been tolerating the dressing changes without complication. Fortunately there does not appear to be any evidence of active infection locally or systemically which is great news and overall I am extremely pleased with where we stand at this point. 02-13-2022 upon evaluation today patient appears to be doing well currently in regard to her wound which is actually showing signs of significant improvement. Fortunately I do not see any evidence of active infection locally or systemically at this time. No fevers, chills, nausea, vomiting, or diarrhea. 02-20-2022 upon evaluation today patient appears to be doing well currently in regard to her wound. She has been tolerating the dressing  changes without complication. Fortunately there does not appear to be any signs of active infection locally or systemically at this time. No fevers, chills, nausea, vomiting, or diarrhea. Objective Constitutional Well-nourished and well-hydrated in no acute distress. Vitals Time Taken: 10:18 AM, Height: 63 in, Weight: 372 lbs, BMI: 65.9, Temperature: 98.3 F, Pulse: 68 bpm, Respiratory Rate: 18 breaths/min, Blood Pressure: 124/78 mmHg. Respiratory normal breathing without difficulty. Psychiatric BRIONA, KORPELA ([id number]) this patient is able to make decisions and demonstrates good insight into disease process. Alert and Oriented x 3. pleasant and cooperative. General Notes: Upon inspection patient's wound bed actually showed signs of good granulation and epithelization at this point. Fortunately I do not see any evidence of active infection which is great news and overall I do feel like she is doing  well there is some need for a little bit of sharp debridement this was a very minimal area but nonetheless I was able to clearway some of the necrotic debris which she tolerated today without complication. Integumentary (Hair, Skin) Wound #10 status is Open. Original cause of wound was Gradually Appeared. The date acquired was: 10/17/2021. The wound has been in treatment 18 weeks. The wound is located on the Left,Medial,Anterior Lower Leg. The wound measures 4.2cm length x 1cm width x 0.1cm depth; 3.299cm^2 area and 0.33cm^3 volume. There is Fat Layer (Subcutaneous Tissue) exposed. There is a medium amount of serosanguineous drainage noted. There is medium (34-66%) red granulation within the wound bed. There is a small (1-33%) amount of necrotic tissue within the wound bed. 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 There was a Excisional Skin/Subcutaneous Tissue Debridement with a total area of 4.2 sq cm performed by Tommie Sams., PA-C. With the following instrument(s): Curette to remove Viable and Non-Viable tissue/material. Material removed includes Subcutaneous Tissue and Slough and. A time out was conducted at 10:41, prior to the start of the procedure. A Minimum amount of bleeding was controlled with Pressure. The procedure was tolerated well. Post Debridement Measurements: 4.2cm length x 1cm width x 0.2cm depth; 0.66cm^3 volume. Character of Wound/Ulcer Post Debridement is stable. Post procedure Diagnosis Wound #10: Same as Pre-Procedure Plan Follow-up Appointments: Return Appointment in 1 week. Nurse Visit as needed Bathing/ Shower/ Hygiene: Clean wound with Normal Saline or wound cleanser. Wash wounds with antibacterial soap and water. May shower; gently cleanse wound with antibacterial soap, rinse and pat dry prior to dressing wounds No tub bath. Edema Control - Lymphedema / Segmental Compressive Device / Other: Ace wraps Elevate, Exercise Daily and Avoid Standing for Long Periods of Time. Elevate leg(s) parallel to the floor when sitting. DO YOUR BEST to sleep in the bed at night. DO NOT sleep in your recliner. Long hours of sitting in a recliner leads to swelling of the legs and/or potential wounds on your backside. Other: - use lymphedema pumps Non-Wound Condition: Cleanse affected area with antibacterial soap and water, Apply appropriate compression. - ace wrap Additional non-wound orders/instructions: - silver cell to open weeping areas with abd pad Medications-Please add to medication list.: P.O. Antibiotics - continue Doxycycline WOUND #10: - Lower Leg Wound Laterality: Left, Medial, Anterior Cleanser: Soap and Water (Generic) Discharge  Instructions: Gently cleanse wound with antibacterial soap, rinse and pat dry prior to dressing wounds Primary Dressing: SILVERCEL Antimicrobial Alginate Dressing, 1x12 (in/in) (Generic) Woodham, Errika C. ([id number]) Secondary Dressing: ABD Pad 5x9 (in/in) (Generic) Discharge Instructions: Cover with ABD pad Secondary Dressing: Conforming Guaze Roll-Medium (Generic) Discharge Instructions: Apply Conforming Stretch Guaze Bandage as directed Secured With: ACE WRAP - 60M ACE Elastic Bandage With VELCRO Brand Closure, 4 (in) (Generic) 1. I am going to suggest that we going continue with the wound care measures as before and the patient is in agreement with plan this includes the use of the silver alginate dressing which I think is still doing a good job here. 2. I am also can recommend that we have the patient continue with the silver alginate dressing which I think is still going to be the best way to  go. 3. I am also can recommend that we continue with the ABD pads to cover followed by roll gauze to secure in place and then Ace wraps. We will see patient back for reevaluation in 1 week here in the clinic. If anything worsens or changes patient will contact our office for additional recommendations. Electronic Signature(s) Signed: 02/20/2022 10:53:17 AM By: Worthy Keeler PA-C Entered By: Worthy Keeler on 02/20/2022 10:53:17 Furrow, [name] ([id number]) -------------------------------------------------------------------------------- SuperBill Details Patient Name: [name] Date of Service: 02/20/2022 Medical Record Number: [id number] Patient Account Number: [id number] Date of Birth/Sex: 02-Jun-1956 (66 y.o. F) Treating RN: Cornell Barman Primary Care Provider: Ranae Plumber Other Clinician: Massie Kluver Referring Provider: Ranae Plumber Treating Provider/Extender: Skipper Cliche in Treatment: 29 Diagnosis Coding ICD-10 Codes Code Description Q82.0 Hereditary  lymphedema L97.811 Non-pressure chronic ulcer of other part of right lower leg limited to breakdown of skin L97.821 Non-pressure chronic ulcer of other part of left lower leg limited to breakdown of skin Facility Procedures CPT4 Code: 27129290 Description: 90301 - DEB SUBQ TISSUE 20 SQ CM/< Modifier: Quantity: 1 CPT4 Code: Description: ICD-10 Diagnosis Description L97.821 Non-pressure chronic ulcer of other part of left lower leg limited to brea Modifier: kdown of skin Quantity: Physician Procedures CPT4 Code: 4996924 Description: 93241 - WC PHYS SUBQ TISS 20 SQ CM Modifier: Quantity: 1 CPT4 Code: Description: ICD-10 Diagnosis Description L97.821 Non-pressure chronic ulcer of other part of left lower leg limited to brea Modifier: kdown of skin Quantity: Electronic Signature(s) Signed: 02/20/2022 10:54:40 AM By: Worthy Keeler PA-C Entered By: Worthy Keeler on 02/20/2022 10:54:40

## 2022-02-21 NOTE — Progress Notes (Signed)
[name] ([id number]) Visit Report for 02/20/2022 Arrival Information Details Patient Name: [name], [name]. Date of Service: 02/20/2022 9:45 AM Medical Record Number: [id number] Patient Account Number: [id number] Date of Birth/Sex: January 22, 1956 (66 y.o. F) Treating RN: Cornell Barman Primary Care Mackinley Cassaday: Ranae Plumber Other Clinician: Massie Kluver Referring Kasidy Gianino: Ranae Plumber Treating Kaidan Spengler/Extender: Skipper Cliche in Treatment: 29 Visit Information History Since Last Visit All ordered tests and consults were completed: No Patient Arrived: [name] Added or deleted any medications: No Arrival Time: 10:14 Any new allergies or adverse reactions: No Transfer Assistance: None Had a fall or experienced change in No Patient Has Alerts: Yes activities of daily living that may affect Patient Alerts: Borderline Diabetic risk of falls: Hospitalized since last visit: No Pain Present Now: Yes Electronic Signature(s) Signed: 02/21/2022 1:47:09 PM By: Massie Kluver Entered By: Massie Kluver on 02/20/2022 10:17:34 Lorio, [name] ([id number]) -------------------------------------------------------------------------------- Clinic Level of Care Assessment Details Patient Name: [name]. Date of Service: 02/20/2022 9:45 AM Medical Record Number: [id number] Patient Account Number: [id number] Date of Birth/Sex: 1955-11-15 (66 y.o. F) Treating RN: Cornell Barman Primary Care Sadat Sliwa: Ranae Plumber Other Clinician: Massie Kluver Referring Prim Morace: Ranae Plumber Treating Emmaline Wahba/Extender: Skipper Cliche in Treatment: 29 Clinic Level of Care Assessment Items TOOL 1 Quantity Score '[]'$  - Use when EandM and Procedure is performed on INITIAL visit 0 ASSESSMENTS - Nursing Assessment / Reassessment '[]'$  - General Physical Exam (combine w/ comprehensive assessment (listed just below) when performed on new 0 pt. evals) '[]'$  - 0 Comprehensive Assessment (HX, ROS, Risk  Assessments, Wounds Hx, etc.) ASSESSMENTS - Wound and Skin Assessment / Reassessment '[]'$  - Dermatologic / Skin Assessment (not related to wound area) 0 ASSESSMENTS - Ostomy and/or Continence Assessment and Care '[]'$  - Incontinence Assessment and Management 0 '[]'$  - 0 Ostomy Care Assessment and Management (repouching, etc.) PROCESS - Coordination of Care '[]'$  - Simple Patient / Family Education for ongoing care 0 '[]'$  - 0 Complex (extensive) Patient / Family Education for ongoing care '[]'$  - 0 Staff obtains Programmer, systems, Records, Test Results / Process Orders '[]'$  - 0 Staff telephones HHA, Nursing Homes / Clarify orders / etc '[]'$  - 0 Routine Transfer to another Facility (non-emergent condition) '[]'$  - 0 Routine Hospital Admission (non-emergent condition) '[]'$  - 0 New Admissions / Biomedical engineer / Ordering NPWT, Apligraf, etc. '[]'$  - 0 Emergency Hospital Admission (emergent condition) PROCESS - Special Needs '[]'$  - Pediatric / Minor Patient Management 0 '[]'$  - 0 Isolation Patient Management '[]'$  - 0 Hearing / Language / Visual special needs '[]'$  - 0 Assessment of Community assistance (transportation, D/[name] planning, etc.) '[]'$  - 0 Additional assistance / Altered mentation '[]'$  - 0 Support Surface(s) Assessment (bed, cushion, seat, etc.) INTERVENTIONS - Miscellaneous '[]'$  - External ear exam 0 '[]'$  - 0 Patient Transfer (multiple staff / Civil Service fast streamer / Similar devices) '[]'$  - 0 Simple Staple / Suture removal (25 or less) '[]'$  - 0 Complex Staple / Suture removal (26 or more) '[]'$  - 0 Hypo/Hyperglycemic Management (do not check if billed separately) '[]'$  - 0 Ankle / Brachial Index (ABI) - do not check if billed separately Has the patient been seen at the hospital within the last three years: Yes Total Score: 0 Level Of Care: ____ [name] ([id number]) Electronic Signature(s) Signed: 02/21/2022 1:47:09 PM By: Massie Kluver Entered By: Massie Kluver on 02/20/2022 10:43:50 Noller, [name]  ([id number]) -------------------------------------------------------------------------------- Encounter Discharge Information Details Patient Name: [name]. Date of Service: 02/20/2022  9:45 AM Medical Record Number: [id number] Patient Account Number: [id number] Date of Birth/Sex: 03/17/1956 (66 y.o. F) Treating RN: Cornell Barman Primary Care Rozalia Dino: Ranae Plumber Other Clinician: Massie Kluver Referring Lucca Ballo: Ranae Plumber Treating Marquasia Schmieder/Extender: Skipper Cliche in Treatment: 29 Encounter Discharge Information Items Post Procedure Vitals Discharge Condition: Stable Temperature (F): 98.3 Ambulatory Status: Cane Pulse (bpm): 68 Discharge Destination: Home Respiratory Rate (breaths/min): 18 Transportation: Private Auto Blood Pressure (mmHg): 124/78 Accompanied By: self Schedule Follow-up Appointment: Yes Clinical Summary of Care: Electronic Signature(s) Signed: 02/21/2022 1:47:09 PM By: Massie Kluver Entered By: Massie Kluver on 02/20/2022 11:01:15 Hooper, [name] -------------------------------------------------------------------------------- Lower Extremity Assessment Details Patient Name: [name]. Date of Service: 02/20/2022 9:45 AM Medical Record Number: [id number] Patient Account Number: [id number] Date of Birth/Sex: 1955/09/01 (66 y.o. F) Treating RN: Cornell Barman Primary Care Emmerie Battaglia: Ranae Plumber Other Clinician: Massie Kluver Referring Natali Lavallee: Ranae Plumber Treating Meer Reindl/Extender: Skipper Cliche in Treatment: 29 Electronic Signature(s) Signed: 02/20/2022 5:01:39 PM By: Gretta Cool BSN, RN, CWS, Kim RN, BSN Signed: 02/21/2022 1:47:09 PM By: Massie Kluver Entered By: Massie Kluver on 02/20/2022 10:34:10 Rathman, [name] ([id number]) -------------------------------------------------------------------------------- Multi Wound Chart Details Patient Name: [name]. Date of Service: 02/20/2022 9:45 AM Medical  Record Number: [id number] Patient Account Number: [id number] Date of Birth/Sex: Oct 08, 1955 (66 y.o. F) Treating RN: Cornell Barman Primary Care Katlyn Muldrew: Ranae Plumber Other Clinician: Massie Kluver Referring Miryah Ralls: Ranae Plumber Treating Elandra Powell/Extender: Skipper Cliche in Treatment: 29 Vital Signs Height(in): 46 Pulse(bpm): 52 Weight(lbs): 160 Blood Pressure(mmHg): 124/78 Body Mass Index(BMI): 65.9 Temperature(F): 98.3 Respiratory Rate(breaths/min): 18 Photos: [N/A:N/A] Wound Location: Left, Medial, Anterior Lower Leg N/A N/A Wounding Event: Gradually Appeared N/A N/A Primary Etiology: Lymphedema N/A N/A Comorbid History: Cataracts, Chronic sinus N/A N/A problems/congestion, Anemia, Lymphedema, Asthma, Sleep Apnea, Tuberculosis, Angina, Hypertension, Gout, Osteoarthritis Date Acquired: 10/17/2021 N/A N/A Weeks of Treatment: 18 N/A N/A Wound Status: Open N/A N/A Wound Recurrence: No N/A N/A Measurements L x W x D (cm) 4.2x1x0.1 N/A N/A Area (cm) : 3.299 N/A N/A Volume (cm) : 0.33 N/A N/A % Reduction in Area: 58.00% N/A N/A % Reduction in Volume: 58.00% N/A N/A Classification: Full Thickness Without Exposed N/A N/A Support Structures Exudate Amount: Medium N/A N/A Exudate Type: Serosanguineous N/A N/A Exudate Color: red, brown N/A N/A Granulation Amount: Medium (34-66%) N/A N/A Granulation Quality: Red N/A N/A Necrotic Amount: Small (1-33%) N/A N/A Exposed Structures: Fat Layer (Subcutaneous Tissue): N/A N/A Yes Epithelialization: Large (67-100%) N/A N/A Treatment Notes Electronic Signature(s) Signed: 02/21/2022 1:47:09 PM By: Massie Kluver Entered By: Massie Kluver on 02/20/2022 10:34:26 Bresee, [name] ([id number]) -------------------------------------------------------------------------------- Multi-Disciplinary Care Plan Details Patient Name: [name]. Date of Service: 02/20/2022 9:45 AM Medical Record Number: [id number] Patient Account  Number: [id number] Date of Birth/Sex: [birth year] (66 y.o. F) Treating RN: Cornell Barman Primary Care Lailyn Appelbaum: Ranae Plumber Other Clinician: Massie Kluver Referring Magnolia Mattila: Ranae Plumber Treating Tarhonda Hollenberg/Extender: Skipper Cliche in Treatment: 29 Active Inactive Wound/Skin Impairment Nursing Diagnoses: Impaired tissue integrity Knowledge deficit related to ulceration/compromised skin integrity Goals: Ulcer/skin breakdown will have a volume reduction of 30% by week 4 Date Initiated: 08/01/2021 Target Resolution Date: 08/29/2021 Goal Status: Active Ulcer/skin breakdown will have a volume reduction of 50% by week 8 Date Initiated: 08/01/2021 Target Resolution Date: 09/26/2021 Goal Status: Active Ulcer/skin breakdown will have a volume reduction of 80% by week 12 Date Initiated: 08/01/2021 Target Resolution Date: 10/24/2021 Goal Status: Active Ulcer/skin breakdown will heal within 14  weeks Date Initiated: 08/01/2021 Target Resolution Date: 11/07/2021 Goal Status: Active Interventions: Assess patient/caregiver ability to obtain necessary supplies Assess patient/caregiver ability to perform ulcer/skin care regimen upon admission and as needed Assess ulceration(s) every visit Provide education on ulcer and skin care Notes: Electronic Signature(s) Signed: 02/20/2022 5:01:39 PM By: Gretta Cool, BSN, RN, CWS, Kim RN, BSN Signed: 02/21/2022 1:47:09 PM By: Massie Kluver Entered By: Massie Kluver on 02/20/2022 10:34:15 Sewell, [name] ([id number]) -------------------------------------------------------------------------------- Pain Assessment Details Patient Name: Delmar Landau [name]. Date of Service: 02/20/2022 9:45 AM Medical Record Number: [id number] Patient Account Number: [id number] Date of Birth/Sex: 1956/01/25 (66 y.o. F) Treating RN: Cornell Barman Primary Care Dujuan Stankowski: Ranae Plumber Other Clinician: Massie Kluver Referring Narjis Mira: Ranae Plumber Treating Aleyza Salmi/Extender: Skipper Cliche in Treatment: 29 Active Problems Location of Pain Severity and Description of Pain Patient Has Paino Yes Site Locations Pain Location: Generalized Pain Duration of the Pain. Constant / Intermittento Constant Character of Pain Describe the Pain: Aching Pain Management and Medication Current Pain Management: Medication: Yes Rest: Yes Electronic Signature(s) Signed: 02/20/2022 5:01:39 PM By: Gretta Cool, BSN, RN, CWS, Kim RN, BSN Signed: 02/21/2022 1:47:09 PM By: Massie Kluver Entered By: Massie Kluver on 02/20/2022 10:22:02 [name] ([id number]) -------------------------------------------------------------------------------- Patient/Caregiver Education Details Patient Name: [name]. Date of Service: 02/20/2022 9:45 AM Medical Record Number: [id number] Patient Account Number: [id number] Date of Birth/Gender: [birth year] (66 y.o. F) Treating RN: Cornell Barman Primary Care Physician: Ranae Plumber Other Clinician: Massie Kluver Referring Physician: Ranae Plumber Treating Physician/Extender: Skipper Cliche in Treatment: 29 Education Assessment Education Provided To: Patient Education Topics Provided Wound/Skin Impairment: Handouts: Other: continue wound care as directed Methods: Explain/Verbal Responses: State content correctly Electronic Signature(s) Signed: 02/21/2022 1:47:09 PM By: Massie Kluver Entered By: Massie Kluver on 02/20/2022 10:44:24 Enrico, [name] ([id number]) -------------------------------------------------------------------------------- Wound Assessment Details Patient Name: Delmar Landau [name]. Date of Service: 02/20/2022 9:45 AM Medical Record Number: [id number] Patient Account Number: [id number] Date of Birth/Sex: 06/26/1956 (66 y.o. F) Treating RN: Cornell Barman Primary Care Selita Staiger: Ranae Plumber Other Clinician: Massie Kluver Referring Hildreth Orsak: Ranae Plumber Treating Wylie Coon/Extender: Skipper Cliche in Treatment:  29 Wound Status Wound Number: 10 Primary Lymphedema Etiology: Wound Location: Left, Medial, Anterior Lower Leg Wound Open Wounding Event: Gradually Appeared Status: Date Acquired: 10/17/2021 Comorbid Cataracts, Chronic sinus problems/congestion, Anemia, Weeks Of Treatment: 18 History: Lymphedema, Asthma, Sleep Apnea, Tuberculosis, Angina, Clustered Wound: No Hypertension, Gout, Osteoarthritis Photos Wound Measurements Length: (cm) 4.2 Width: (cm) 1 Depth: (cm) 0.1 Area: (cm) 3.299 Volume: (cm) 0.33 % Reduction in Area: 58% % Reduction in Volume: 58% Epithelialization: Large (67-100%) Wound Description Classification: Full Thickness Without Exposed Support Structu Exudate Amount: Medium Exudate Type: Serosanguineous Exudate Color: red, brown res Foul Odor After Cleansing: No Slough/Fibrino Yes Wound Bed Granulation Amount: Medium (34-66%) Exposed Structure Granulation Quality: Red Fat Layer (Subcutaneous Tissue) Exposed: Yes Necrotic Amount: Small (1-33%) Treatment Notes Wound #10 (Lower Leg) Wound Laterality: Left, Medial, Anterior Cleanser Peri-Wound Care Topical Primary Dressing Secondary Dressing Secured With RAMIYA, DELAHUNTY ([id number]) Compression Wrap Compression Stockings Add-Ons Electronic Signature(s) Signed: 02/20/2022 5:01:39 PM By: Gretta Cool, BSN, RN, CWS, Kim RN, BSN Signed: 02/21/2022 1:47:09 PM By: Massie Kluver Entered By: Massie Kluver on 02/20/2022 10:33:38 Lecomte, [name] ([id number]) -------------------------------------------------------------------------------- Vitals Details Patient Name: [name]. Date of Service: 02/20/2022 9:45 AM Medical Record Number: [id number] Patient Account Number: [id number] Date of Birth/Sex: April 27, 1956 (66 y.o. F) Treating RN: Cornell Barman Primary Care Kagan Hietpas: Ranae Plumber Other  Clinician: Massie Kluver Referring Amyria Komar: Ranae Plumber Treating Pratham Cassatt/Extender: Skipper Cliche in  Treatment: 29 Vital Signs Time Taken: 10:18 Temperature (F): 98.3 Height (in): 63 Pulse (bpm): 68 Weight (lbs): 372 Respiratory Rate (breaths/min): 18 Body Mass Index (BMI): 65.9 Blood Pressure (mmHg): 124/78 Reference Range: 80 - 120 mg / dl Electronic Signature(s) Signed: 02/21/2022 1:47:09 PM By: Massie Kluver Entered By: Massie Kluver on 02/20/2022 10:21:38

## 2022-02-26 ENCOUNTER — Ambulatory Visit: Payer: Medicare HMO | Admitting: Family

## 2022-02-27 ENCOUNTER — Encounter: Payer: Medicare HMO | Admitting: Physician Assistant

## 2022-02-27 DIAGNOSIS — Q820 Hereditary lymphedema: Secondary | ICD-10-CM | POA: Diagnosis not present

## 2022-02-27 NOTE — Progress Notes (Signed)
SOLYANA, NONAKA ([id number]) Visit Report for 02/27/2022 Chief Complaint Document Details Patient Name: [name], [name]. Date of Service: 02/27/2022 9:45 AM Medical Record Number: [id number] Patient Account Number: [id number] Date of Birth/Sex: 1955-09-11 (66 y.o. F) Treating RN: Carlene Coria Primary Care Provider: Ranae Plumber Other Clinician: Massie Kluver Referring Provider: Ranae Plumber Treating Provider/Extender: Skipper Cliche in Treatment: 30 Information Obtained from: Patient Chief Complaint Bilateral LE lymphedema with Left LE Ulcers Electronic Signature(s) Signed: 02/27/2022 9:56:11 AM By: Worthy Keeler PA-C Entered By: Worthy Keeler on 02/27/2022 09:56:11 Obeid, [name] ([id number]) -------------------------------------------------------------------------------- HPI Details Patient Name: [name] Date of Service: 02/27/2022 9:45 AM Medical Record Number: [id number] Patient Account Number: [id number] Date of Birth/Sex: Nov 13, 1955 (66 y.o. F) Treating RN: Carlene Coria Primary Care Provider: Ranae Plumber Other Clinician: Massie Kluver Referring Provider: Ranae Plumber Treating Provider/Extender: Skipper Cliche in Treatment: 40 History of Present Illness HPI Description: The patient is a 66 year old female with history of hypertension and a long-standing history of bilateral lower extremity lymphedema (first presented on 4/2) . She has had open ulcers in the past which have always responded to compression therapy. She had briefly been to a lymphedema clinic in the past which helped her at the time. this time around she stopped treatment of her lymphedema pumps approximately 2 weeks ago because of some pain in the knees and then noticed the right leg getting worse. She was seen by her PCP who put her on clindamycin 4 times a day 2 days ago. The patient has seen [facility] and Dr. Delana Meyer had seen her last year where a vascular study including venous  and arterial duplex studies were within normal limits. he had recommended compression stockings and lymphedema pumps and the patient has been using this in about 2 weeks ago. She is known to be diabetic but in the past few time she's gone to her primary care doctor her hemoglobin A1c has been normal. 02/11/2015 - after her last visit she took my advice and went to the ER regarding the progressive cellulitis of her right lower extremity and she was admitted between July 17 and 22nd. She received IV antibiotics and then was sent home on a course of steroid-induced and oral antibiotics. She has improved much since then. 02/17/2015 -- she has been doing fine and the weeping of her legs has remarkably gone down. She has no fresh issues. READMISSION 01/15/18 This patient was given this clinic before most recently in 2016 seen by Dr. Con Memos. She has massive bilateral lymphedema and over the last 2 months this had weeping edema out of the left leg. She has compression pumps but her compliance with these has been minimal. She has [facility] they've been using TCA/ABDs/kerlix under an Ace wrap.she has had recent problems with cellulitis. She was apparently seen in the ER and 12/23/17 and given clindamycin. She was then followed by her primary doctor and given doxycycline and Keflex. The pain seems to have settled down. In April 2018 the patient had arterial studies done at [facility]. This showed triphasic waveforms throughout the right leg and mostly triphasic waveforms on the left except for monophasic at the posterior tibial artery distally. She was not felt to have evidence of right lower extremity arterial stenosis or significant problems on the left side. 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 She also had a right lower extremity venous Doppler in January 2018 this was limited  by the patient's body habitus and lymphedema. Most of the proximal veins  were not visualized The patient presents with an area of denuded skin on the anterior medial part of the left calf. There is weeping edema fluid here. 01/22/18; the patient has somewhat better edema control using her compression pumps twice a day and as a result she has much better epithelialization on the left anterior calf area. Only a small open area remains. 01/29/18; the patient has been compliant with her compression pumps. Both the areas on her calf that healed. The remaining area on the left anterior leg is fully epithelialized Readmission: 02/20/2019 upon evaluation today patient presents for reevaluation due to issues that she is having with the bilateral lower extremities. She actually has wounds open on both legs. On the right she has an area in the crease of her leg on the right around the knee region which is actually draining quite a bit and actually has some fungal type appearance to it. She has been on nystatin powder that seems to have helped to some degree. In regard to the left lower extremity this is actually in the lower portion of her leg closer to the ankle and again is continuing to drain as well unfortunately. There does not appear to be any signs of active infection at this time which is good news. No fevers, chills, nausea, vomiting, or diarrhea. She tells me that since she was seen last year she is actually been doing quite well for the most part with regard to her lower extremities. Unfortunately she now is experiencing a little bit more drainage at this time. She is concerned about getting this under control so that it does not get significantly worse. 02/27/2019 on evaluation today patient appears to be doing somewhat better in regard to her bilateral lower extremity wounds. She has been tolerating the dressing changes without complication. Fortunately there is no signs of active infection at this point. No fevers, chills, nausea, vomiting, or diarrhea. She did get her  dressing supplies which is excellent news she was extremely excited to get these. She also got paperwork from [employer] for their financial assistance program where they may be able to help her out in the future if needed with supplies at discounted prices. 03/06/2019 on evaluation today patient appears to be doing a little worse with regard to both areas of weeping on her bilateral lower extremities. This is around the right medial knee and just above the left ankle. With that being said she is unfortunately not doing as well as I would like to see. I feel like she may need to potentially go see someone at the lymphedema clinic as the wraps that she needs or even beyond what we can do here at the wound care center. She really does not have wounds she just has open areas of weeping that are causing some difficulty for her. Subsequently because of this and the moisture I am concerned about the potential for infection I am going to likely give her a prophylactic antibiotic today, Keflex, just to be on the safe side. Nonetheless again there is no obvious signs of active infection at this time. 03/13/2019 on evaluation today patient appears to be doing well with regard to her bilateral lower extremities where she has been weeping compared to even last week's evaluation. I see some areas of new skin growth which is excellent and overall I am very pleased with how things seem to be progressing. No fevers, chills, nausea,  vomiting, or diarrhea. [name], [name] ([id number]) 03/20/2019 on evaluation today patient unfortunately is continuing to have issues with significant edema of the left lower extremity. Her right side seems to be doing much better. Unfortunately her left side is showing increased weeping of the lower portion of her leg. This is quite unfortunate obviously we were hoping to get her into the lymphedema clinic they really do not seem to when I see her how if she is draining. Despite the fact this is  really not wound related but more lymphedema weeping related. Nonetheless I do not know that this can be helpful for her to even go for that appointment since again I am not sure there is much that they would actually do at this point. We may need to try a 4 layer compression wrap as best we can on her leg. She is on the Augmentin currently although I am still concerned about whether or not there could be potentially something going on infection wise I would obtain a culture though I understand is not the best being that is a surface culture I just 1 to make sure I do not seem to be missing anything. 03/27/2019 on evaluation today patient appears to be doing much better in regard to the left lower extremity compared to last week. Last week she had tremendous weeping which I think was subsequent to infection now she seems to be doing much better and very pleased. This is not completely healed but there is a lot of new skin growth and it has dried out quite a bit. Overall I think that we are doing well with how things are moving along at this time. No fevers, chills, nausea, vomiting, or diarrhea. 04/03/2019 evaluation today patient appears to be doing a little worse this week compared to last time I saw her. I think this may be due to the fact that she is having issues with not being able to sleep in her bed at least not until last night. She is therefore been in a lift chair and subsequently has also had issues with not been able to use her pumps since she could not get in bed. With that being said the patient overall seems to be doing okay I do think I may want extend the antibiotic for a little bit longer at least until we can see if her edema and her weeping gets better and if it is then obviously I can always discontinue the antibiotics as of next week however I want her to continue to have it over the next week. 04/10/2019 on evaluation today patient unfortunately is still doing poorly with regard  to her left lower extremity. Her right is all things considering doing fairly well. On the left however she continues to have spreading of the area of infection and weeping which appears to be even a larger surface area than noted last week. She did have a positive culture for Pseudomonas in particular which seems to have been of concern she still has green/yellow discharge consistent with Pseudomonas and subsequently a tremendous amount of it. This has me obviously still concerned about the infection not really clearing up despite the fact that on culture it appears the Cipro should have been a good option for treating this. I think she may at this point need IV antibiotics since things are not doing better I do not want to get worse and cause sepsis. She is in agreement with the plan and believes as well that  she likely does need to go to the hospital for IV vancomycin. Or something of the like depending on what the recommendation is from the ER. 04/17/2019 on evaluation today patient appears to be doing excellent in regard to her lower extremity on the left. She was in the hospital for several days from when I sent her last we saw her until just this past Tuesday. Fortunately her drainage is significantly improved and in fact is mostly clear. There is just a couple small areas that may still drain a little bit she states that the [facility] they prescribed for her at discharge she went picked up from pharmacy and got home but has not been able to find it since. She is looked everywhere. She is wondering if I will replace that for her today I will be more than happy to do that. 05/01/2019 on evaluation today patient actually appears to be doing quite well with regard to her lower extremities. She occasionally is having areas that will leak and then heal up mainly when a piece of the fibrotic skin pops off but fortunately she is not having any signs of active infection at this time. Overall she also  really does not have any obvious weeping at this time. I do believe however she really needs some compression wraps and I think this may be a good time to get her back to the lymphedema clinic. 05/11/2019 on evaluation today patient actually appears to be doing quite well with regard to her bilateral lower extremities. She occasionally will have a small area that we per another but in general seems to be completely healed which is great news. Overall very pleased with how everything seems to be progressing. She does have her appointment with lymphedema clinic on November 18. 05/25/2019 on evaluation today patient appears to be doing well with regard to her left lower extremity. I am very pleased in this regard. In regard to her right leg this actually did start draining more I think it is mainly due to the fact that her leg is more swollen. I am not seeing any obvious signs of infection at this time although that is definitely something were obviously acutely aware of simply due to the fact that she had an issue not too far back with exactly this issue. Nonetheless I do feel like that lymphedema clinic would still be beneficial for her. I explained obviously if they are not able to do anything treatment wise on the right leg we could at least have them treat her left leg and then proceed from there. The patient is really in agreement with that plan. If they are able to do both as the drainage slows down that I would be happy to let them handle both. 06/01/2019 on evaluation today patient unfortunately appears to be doing worse with regard to her right lower extremity. The left lower extremity is still maintaining at this point. Unfortunately she has been having significantly increased pain over the past several days and has been experiencing as well increased swelling of the right lower extremity. I really do not know that I am seeing anything that appears to be obvious for infection at this point to  be peripherally honest. With that being said the patient does seem to be having much more swelling that she is even experienced in the past and coupled with increased pain in her hip as well I am concerned that again she could potentially have a DVT although I am not 100% sure  of this. I think it something that may need to be checked out. We discussed the possibility of sending her for a DVT study through the hospital but unfortunately transportation is an issue if she does have a DVT I do not want her to wait days to be able to get in for that test however if she has this scheduled as an outpatient that is as fast that she will be able to get the test scheduled for transportation purposes. That will also fall on Thanksgiving so subsequently she did actually be looking at either Friday or even next week before we would know anything back from this. That is much too long in my opinion. Subsequent to the amount of discomfort she is experiencing the patient is actually okay with going to the ER for evaluation today. 06/12/2019 on evaluation today patient actually appears to be doing significantly better compared to last time I saw her. Following when I last saw her she was actually in the hospital from that Monday until the following Sunday almost 1 full week. She actually was placed on Keflex in the hospital following the time for her to be discharged and Dr. Steva Ready has recommended 2 times a day dosing of the Keflex for the next year in order to help with more prophylactic/preventative measures with regard to her developing cellulitis. Overall I think this sounds like an excellent plan. The patient unfortunately is good to have trouble being treated at lymphedema clinic due to the fact that she really cannot get up on the bed that they have there. They also state that they cannot manage her as long as she has anything draining at this point. Obviously that is somewhat unfortunate as she does need help  with edema control but nonetheless we will have to do what we can for her outside of it sounds like the lymphedema clinic scenario at this point. 06/19/2019 on evaluation today patient appears to be doing fairly well with regard to her bilateral lower extremities. She is not nearly as swollen and shows no signs of infection at this point. There is no evidence of cellulitis whatsoever. She also has no open wounds or draining at this point which is also good news. No fever chills noted. She seems to be in very good spirits and in fact appears to be doing quite well. READMISSION 11/27/2019 [name], [name] ([id number]) This is a 66 year old woman that we have had in this clinic several times before including 2015, 16 and 19 and then most recently from 03/20/2019 through 06/19/2019 with bilateral lower extremity lymphedema. She has had previous arterial and reflux studies done years ago which were not all that remarkable. 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 She does have a positive family history and she had large legs starting may be in her 60s. She was recently in hospital from 10/20/2019 through 10/28/2019 with right leg cellulitis. She was given Ancef and clindamycin and then Zosyn when a culture showed Pseudomonas. At that time there was purulent drainage. She was followed by infectious disease Dr Steva Ready. The patient is now back at home. She has noted increased swelling in the right and no drainage in her right leg mostly on the posterior medial aspect in the calf area. She has not had pain or fever. She has literally been improved lysing above dressings because her at the area of this is far too large for standard compression. She has been wrapping the areas  with sheets to resorptive pads. She is found these helped somewhat. She does have an appointment with the lymphedema clinic in [place] in late June. Past medical history  includes bilateral lymphedema, hypertension, obstructive sleep apnea with CPAP. Recent hospitalization with apparently Pseudomonas cellulitis of the right lower leg 12/15/2019 upon evaluation today patient appears to be doing a little bit worse in regard to her right lower extremity. Unfortunately she is having more weeping down in the lower portion of her leg. Fortunately there is no signs of active infection at this time. No fever chills noted. The patient states she is not having increased pain except for when she attempted to use the lymphedema pumps unfortunately she states that she did have pain when she did this. Otherwise we been using absorptive dressings of one type or another she is using diapers at home and then subsequently Ace wraps. In regard to the barrier cream we have discussed the possibility of derma cloud which she would like to try I do not have a problem with that. 12/22/2019 upon evaluation today patient actually appears to be doing better in regard to her leg ulcers at this point. Fortunately there does not appear to be any signs of active infection which is great news and I am extremely pleased with where things are progressing at this time. There is no sign of active infection currently. The patient is very pleased to see things doing so well. 12/29/2019 upon evaluation today patient appears to be doing a little bit better in regard to her weeping in general over her lower extremities. She does have some signs of mild erythema little bit more than what I noted last week or rather last visit. Nonetheless I think that my threshold for switching her antibiotics from Keflex to something else is very low at this point considering that she has had such severe infections in the past that seem to come almost out of nowhere. There is a little erythema and warmth noted of the lower portion of her leg compared to the upper which also makes me want to go ahead and address things more  rapidly at this point. Likely I would switch out the Keflex for something like Levaquin ideally. 7/16; patient with severe bilateral lymphedema. She has superficial wounds albeit almost circumferential now on the left lateral lower leg. This may be new from last time. Small area on the right anterior lower leg and then another area on the right medial lower leg and of pannus fold. She has been using various absorptive garments. She states she is using her compression pumps once a day occasionally twice. Culture from her last visit here was negative 01/29/2020 on evaluation today patient appears to be doing excellent at this point in regard to her legs with regard to infection I see no signs of active infection at this point. She still does have unfortunately areas of weeping this is minimal on the right now her left is actually significantly worse although I do not think it is as bad as last week with Dr. Dellia Nims saw her. She has been trying to pump and elevate her legs is much as possible. She has previously been on the Keflex and in the past for prevention that seems to do fairly well and likely can extend that today. 02/04/2020 on evaluation today patient appears to be doing better in regard to her legs bilaterally. Fortunately there is no signs of active infection at this time which is great news and overall  she has less weeping on the left compared to the right and there is several spots where she is pretty much sealed up with no draining regions. Overall very pleased in this regard. 02/19/2020 on evaluation today patient appears to be doing very well in regard to her wounds currently. Fortunately there is no evidence of active infection overall very pleased with where things stand. She is significantly improved in regard to her edema I am extremely pleased in this regard she tells me that the popping no longer hurts and in fact she actually looks forward to it. 03/04/2020 on evaluation today patient  appears to be doing excellent in regard to her lower extremities. Fortunately there is no signs of active infection at this time. No fevers, chills, nausea, vomiting, or diarrhea. 03/25/2020 on evaluation today patient appears to be doing a little bit more poorly in regard to her legs at this point. She tells me that she is still continue to have issues with drainage and this has been a little bit worse she was getting ready to start taking the Keflex again but wanted to see me first. Fortunately there is no signs of active infection at this time. No fevers, chills, nausea, vomiting, or diarrhea. 04/15/2020 upon evaluation today patient appears to be doing somewhat poorly in regard to her right leg. She tells me she has been having more pain she has been taking the Keflex that was previously prescribed unfortunately that just does not seem to help with this. She was hoping that the pain on her right was actually coming from the fact that she was having issues with her wrap having gotten caught in her recliner. With that being said she tells me that she knew something was not right. Currently her right leg is warm to touch along with being erythematous all the way up to around at least mid thigh as far as I can see. The left leg does not appear to be doing that badly though there is increased weeping around the ankle region. 05/06/2020 on evaluation today patient appears to be doing much better than last time I saw her. She did go to the hospital where she was admitted for 2 days and treated with antibiotic therapy. She was discharged with antibiotics as well and has done extremely well. I am extremely pleased with where things stand today. There is no signs of active infection at this time which is great news. 05/27/2020 upon evaluation today patient appears to be doing well with regard to her lower extremities bilaterally. She has just a very tiny area on the right leg which is opening on the left leg  she is significantly improved though she still has several areas that do appear to be open this is minimal compared to what is been in the past. In general I am extremely pleased with where things stand today. The patient does tell me she is not been using her pumps quite as much as she should be. I do believe that is 1 area she can definitely work on. She has had a lot going on including a Covid exposure and apparently also a outbreak of likely shingles. 06/24/2020 upon evaluation today patient appears to be doing well with regard to her legs in general although the left leg unfortunately is showing some signs of erythema she does have a little bit of increased weeping and to be honest I am concerned about infection here. I discussed that with her today and I think that we  may need to address this sooner rather than later she has been taking Keflex she is not really certain that is been making a big improvement however. No fevers, chills, nausea, vomiting, or diarrhea. [name], [name] ([id number]) 06/30/2020 upon evaluation today patient's legs actually seem to be doing better in my opinion as compared to where they were last week. Fortunately there does not appear to be any signs of active infection. Her culture showed multiple organisms nothing predominate. With that being said the Levaquin seems to have done well I think she has improved since I last saw her as well. 07/14/2020 upon evaluation today patient appears to be doing actually better in regard to her lower extremities in my opinion. She has been tolerating the dressing changes without complication. Fortunately there is no signs of active infection at this time. 08/04/2020 upon evaluation today patient appears to be doing excellent in regard to her leg ulcers. Fortunately she has very little that is open at this point. This is great news. In fact I think that the Gold[employer] medicated powder has been excellent for her. It seems to have done the  trick where we had tried several other things without as much success. Fortunately there is no evidence of active infection at this time. No fevers, chills, nausea, vomiting, or diarrhea. 09/01/2020 upon evaluation today patient appears to be doing a little bit more poorly than the last time I saw her. She tells me right now that she has been having a lot of drainage compared to where things were previous which has unfortunately led to more irritation as well. She is concerned that this is leading to infection. Again based on what I am seeing today as well I am also concerned of the same to be honest. 09/15/2020 upon evaluation today patient appears to be doing well with regard to her legs compared to what I saw previous. Fortunately there does not appear to be any evidence of active infection at this time which is great news. At least not as badly as what it was previous. With that being said I do believe that the patient does need to have an extension of the antibiotics. This I believe will actually help her more in the way of making sure this stays under control and does not worsen. That was the Bactrim DS. Readmission: 08/01/2021 upon evaluation today patient appears to be doing actually pretty well all things considered. Is actually been almost a year since have seen her last March. Fortunately I do not see any evidence of active infection locally nor systemically at this point. She does have a couple open areas on the left leg the right leg appears to be doing quite well. In general she has been in the hospital 3 times since I saw her a year ago all 4 cellulitis type issues except for the last 1 which was actually more related to congestive heart failure for that reason she is not using her lymphedema pumps at this point and that is probably the best idea. 08/08/2021 upon evaluation today patient appears to be doing well with regard to her legs all things considered her left leg may be a little bit  more swollen based on what I see today. Fortunately I do not see any signs of active infection locally nor systemically at this time which is great news. No fevers, chills, nausea, vomiting, or diarrhea. 08/15/2021 upon evaluation today patient actually appears to be doing excellent in regard to her wounds. She has  been tolerating the dressing changes without complication. Fortunately I see no evidence of infection currently which is great news. No fevers, chills, nausea, vomiting, or diarrhea. 08/29/2021 upon evaluation patient appears to be doing excellent she in fact is almost completely healed. I am actually very pleased with where we stand today and I think that she is making wonderful progress she just has a small area of weeping on the right lateral leg everything else is pretty much completely healed which is also. 09/05/2021 upon evaluation today patient appears to be doing well with regard to her legs. She does have a small area of weeping on the right leg and there is a small area on the left leg as well. It is potentially something that may need to be addressed in the future more specifically but right now I think that there may have just been a little drainage here on the left. With all that being said I think that this still does not appear to be infected and overall I feel like she is doing quite well. That something we always have to keep a very close eye on as she can change very rapidly from okay to not okay. 09/12/2021 upon evaluation today patient appears to be doing a little bit worse in regard to her leg and actually somewhat concerned about the possibility of infection here. I discussed that with the patient. For that reason I am going to go ahead and see about getting her set up for a repeat prescription for the Bactrim. She is in agreement with that plan. 3/14; patient presents for follow-up. She has been using silver alginate with dressing changes. She is still taking Bactrim  prescribed at last clinic visit. She has no issues or complaints today. She has lymphedema pumps however does not use these. 09/26/2020 upon evaluation today patient appears to actually be doing well in regard to her right leg I am pleased in that regard. Unfortunately she has new areas on her left leg that are open at this point that I do need to be addressed. I am also concerned about the warmth around one of the wounds on the more anterior side. I think this is something that we may need to culture today potentially requiring antibiotics going forward. 10/03/2021 upon evaluation today patient unfortunately is not doing nearly as well as she was even last week with her wounds. She is having some issues here with increased cellulitis of the left lower extremity unfortunately. I did prescribe Augmentin for her eczema prescribed last week unfortunately she never actually received this prescription. The pharmacy stated they never got it. We double checked with them today they still did not receive it. I am not sure what happened in that regard. With that being said the patient is in good spirits today she does not appear to be as sick as where she normally is when the cellulitis gets significantly worse as it has in the past. Nonetheless the leg is much more red not just around the ankle where I was seeing at last week on Tuesday but rather now this is going all the way up to almost her knee and is definitely hot to touch compared to the right leg. Nonetheless I feel like she does need to go to the ER for likely IV antibiotics. 10-10-2021 upon evaluation today patient appears to be doing well with regard to her wound. She has been tolerating the dressing changes that appears to be doing much better. After I saw  her last week she did go to the ER they admitted her to the hospital and gave her IV antibiotics she fortunately is doing significantly better. Overall I am extremely pleased with where things stand  today. 10-17-2021 upon evaluation today patient appears to be doing well with regard to her wound. In fact this is looking better and her leg is looking better but she still does have some areas here of erythema. We will continue to address this as soon as possible in my opinion. I do believe she may require some debridement and what appears to be a more defined wound on her left leg at this point. 10-24-2021 upon evaluation patient is definitely showing signs of improvement which is great news. I do not see any evidence of active infection locally or systemically which is great news as well. No fevers, chills, nausea, vomiting, or diarrhea. 10-31-2021. Upon evaluation today patient's leg actually feels better from an infection and wound care perspective. I am actually very pleased in this regard. Unfortunately the biggest issue that I see at this time is that the patient is having a significant issue with her breathing. I did put her on the pulse ox machine to check her oxygen saturation and it was pretty much at 100% which is good but she tells me that when she is walking [name], [name] ([id number]) this is much more difficult for her. She also tells me that when she is trying to bend over to perform her dressing changes she is so short of breath due to the swelling and fluid in her abdominal area that she is just not been able to do this. She is tearful and very concerned today to be honest. I am truly sorry to see her this way I know she is really having a hard time at the moment. 11-14-2021 upon evaluation patient actually appears to be doing significantly better compared to when I last saw her. She was actually admitted to the hospital I believe this for 6 days total after I sent her on 10-31-2021. Subsequently they actually pulled off greater than 50 pounds of fluid she tells me she feels significantly better her legs are not as tight she actually has some play in the tissue and it is obvious that  she is doing much better just from a mobility standpoint as well as a breathing standpoint. Overall I am extremely happy for her and how she is doing the leg also looks much better on the left. 11-21-2021 upon evaluation today patient appears to be doing well although it does appear that she is going require some sharp debridement the wound is very dry this is the opposite of what its been she was having so much weeping that it was staying extremely wet. Nonetheless we do need to see about going ahead and debriding the wound today. 11-28-2021 upon evaluation today patient appears to be doing well with regard to her wound I definitely see signs of things continuing to improve which is great news. I do not see any evidence of active infection locally or systemically also great news. 12-05-2021 upon evaluation today patient appears to be doing well with regard to her wound. She has been tolerating the dressing changes. Fortunately there does not appear to be any signs of active infection locally or systemically at this time. No fevers, chills, nausea, vomiting, or diarrhea. 12-12-2021 upon evaluation patient appears to be doing well with regard to her wound this is actually measuring smaller and looking much  better. Fortunately I do not see any signs of active infection locally or systemically at this time which is excellent news. 6/13; small wound area in the folds of her lymphedema just above the left ankle. Fortunately the area looks quite good. She has been using silver alginate ABDs and an Ace wrap which she is able to change her self. She is not using her compression pumps out of fear that this could contribute to heart failure. 12-26-2021 upon evaluation patient's wounds are actually showing signs of excellent improvement. I am very pleased with where things stand. Overall I think that she has been making excellent progress and I think we are very close to complete resolution which is great  news. 01-11-2022 upon evaluation today patient appears to be doing well currently in regard to her legs in general although on the left leg she does have 1 area that still irritated and inflamed on the anterior portion of her leg. Fortunately I do not see any signs of systemic infection locally there might be some infection going on here however which is my biggest concern. Fortunately I do not see any evidence though of this spreading to any other location. 01-16-2022 upon evaluation today patient has actually showing signs of excellent improvement. Fortunately I do not see any evidence of infection locally or systemically which is great news and overall I am very pleased with where we stand at this point. 01-25-2022 upon evaluation today patient actually appears to be doing decently well in regard to her legs. She has been tolerating the dressing changes without complication. Fortunately there does not appear to be any evidence of active infection locally or systemically which is great news and overall I am extremely pleased with where we stand at this point. 02-13-2022 upon evaluation today patient appears to be doing well currently in regard to her wound which is actually showing signs of significant improvement. Fortunately I do not see any evidence of active infection locally or systemically at this time. No fevers, chills, nausea, vomiting, or diarrhea. 02-20-2022 upon evaluation today patient appears to be doing well currently in regard to her wound. She has been tolerating the dressing changes without complication. Fortunately there does not appear to be any signs of active infection locally or systemically at this time. No fevers, chills, nausea, vomiting, or diarrhea. 02-27-2022 upon evaluation today patient appears to be doing excellent in regard to her wounds on the leg. She has been tolerating the dressing changes without complication this is very close to complete resolution. Fortunately I do  not see any signs of infection locally or systemically at this time which is great news. No fevers, chills, nausea, vomiting, or diarrhea. Electronic Signature(s) Signed: 02/27/2022 10:40:58 AM By: Worthy Keeler PA-C Entered By: Worthy Keeler on 02/27/2022 10:40:58 [name], [name] ([id number]) -------------------------------------------------------------------------------- Physical Exam Details Patient Name: [name], [name] C. Date of Service: 02/27/2022 9:45 AM Medical Record Number: [id number] Patient Account Number: [id number] Date of Birth/Sex: [birth year] (66 y.o. F) Treating RN: Carlene Coria Primary Care Provider: Ranae Plumber Other Clinician: Massie Kluver Referring Provider: Ranae Plumber Treating Provider/Extender: Skipper Cliche in Treatment: 64 Constitutional Well-nourished and well-hydrated in no acute distress. Respiratory normal breathing without difficulty. Psychiatric this patient is able to make decisions and demonstrates good insight into disease process. Alert and Oriented x 3. pleasant and cooperative. Notes Upon inspection patient's wound bed actually showed signs of good granulation and epithelization at this point. Fortunately I do not see any evidence  of infection at this point which is great news and overall I am extremely pleased with where things stand currently. Overall I think she is very close to complete resolution. Electronic Signature(s) Signed: 02/27/2022 10:41:19 AM By: Worthy Keeler PA-C Entered By: Worthy Keeler on 02/27/2022 10:41:18 Lalli, [name] ([id number]) -------------------------------------------------------------------------------- Physician Orders Details Patient Name: [name] Date of Service: 02/27/2022 9:45 AM Medical Record Number: [id number] Patient Account Number: [id number] Date of Birth/Sex: 1955/11/28 (66 y.o. 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 o Nurse Visit as needed Bathing/ Shower/ Hygiene o Clean wound with Normal Saline or wound cleanser. o Wash wounds with antibacterial soap and water. o May shower; gently cleanse wound with antibacterial soap, rinse and pat dry prior to dressing wounds o No tub bath. Edema Control - Lymphedema / Segmental Compressive Device / Other o Ace wraps o Elevate, Exercise Daily and Avoid Standing for Long Periods of Time. o Elevate leg(s) parallel to the floor when sitting. o DO YOUR BEST to sleep in the bed at night. DO NOT sleep in your recliner. Long hours of sitting in a recliner leads to swelling of the legs and/or potential wounds on your backside. o Other: - use lymphedema pumps Non-Wound Condition Bilateral Lower Extremities o Cleanse affected area with antibacterial soap and water, o Apply appropriate compression. - ace wrap o Additional non-wound orders/instructions: - silver cell to open weeping areas with abd pad Wound Treatment Wound #10 - Lower Leg Wound Laterality: Left, Medial, Anterior Cleanser: Soap and Water (Generic) Discharge Instructions: Gently cleanse wound with antibacterial soap, rinse and pat dry prior to dressing wounds Primary Dressing: SILVERCEL Antimicrobial Alginate Dressing, 1x12 (in/in) (Generic) Secondary Dressing: ABD Pad 5x9 (in/in) (Generic) Discharge Instructions: Cover with ABD pad Secondary Dressing: Conforming Guaze Roll-Medium (Generic) Discharge Instructions: Apply  Conforming Stretch Guaze Bandage as directed Secured With: ACE WRAP - 42M ACE Elastic Bandage With VELCRO Brand Closure, 4 (in) (Generic) Electronic Signature(s) Unsigned Entered ByMassie Kluver on 02/27/2022 10:27:13 Signature(s): Date(s): [name] ([id number]) -------------------------------------------------------------------------------- Problem List Details Patient Name: [name], [name] C. Date of Service: 02/27/2022 9:45 AM Medical Record Number: [id number] Patient Account Number: [id number] Date of Birth/Sex: [birth year] (66 y.o. F) Treating RN: Carlene Coria Primary Care Provider: Ranae Plumber Other Clinician: Massie Kluver Referring Provider: Ranae Plumber Treating Provider/Extender: Skipper Cliche in Treatment: 30 Active Problems ICD-10 Encounter Code Description Active Date MDM Diagnosis Q82.0 Hereditary lymphedema 08/01/2021 No Yes L97.811 Non-pressure chronic ulcer of other part of right lower leg limited to 08/01/2021 No Yes breakdown of skin L97.821 Non-pressure chronic ulcer of other part of left lower leg limited to 08/01/2021 No Yes breakdown of skin Inactive Problems Resolved Problems Electronic Signature(s) Signed: 02/27/2022 9:56:07 AM By: Worthy Keeler PA-C Entered By: Worthy Keeler on 02/27/2022 09:56:06

## 2022-03-01 NOTE — Progress Notes (Signed)
[name] ([id number]) Visit Report for 02/27/2022 Arrival Information Details Patient Name: [name], [name]. Date of Service: 02/27/2022 9:45 AM Medical Record Number: [id number] Patient Account Number: [id number] Date of Birth/Sex: 1956-05-01 (66 y.o. F) Treating RN: Carlene Coria Primary Care Mettie Roylance: Ranae Plumber Other Clinician: Massie Kluver Referring Giavanni Zeitlin: Ranae Plumber Treating Kilian Schwartz/Extender: Skipper Cliche in Treatment: 58 Visit Information History Since Last Visit All ordered tests and consults were completed: No Patient Arrived: [name] Added or deleted any medications: No Arrival Time: 09:52 Any new allergies or adverse reactions: No Transfer Assistance: None Had a fall or experienced change in No Patient Has Alerts: Yes activities of daily living that may affect Patient Alerts: Borderline Diabetic risk of falls: Hospitalized since last visit: No Pain Present Now: No Electronic Signature(s) Signed: 02/27/2022 12:46:44 PM By: Massie Kluver Entered By: Massie Kluver on 02/27/2022 09:55:56 Connaughton, [name] ([id number]) -------------------------------------------------------------------------------- Clinic Level of Care Assessment Details Patient Name: [name]. Date of Service: 02/27/2022 9:45 AM Medical Record Number: [id number] Patient Account Number: [id number] Date of Birth/Sex: 1955-07-16 (66 y.o. F) Treating RN: Carlene Coria Primary Care Dema Timmons: Ranae Plumber Other Clinician: Massie Kluver Referring Arnika Larzelere: Ranae Plumber Treating Loleta Frommelt/Extender: Skipper Cliche in Treatment: 30 Clinic Level of Care Assessment Items TOOL 4 Quantity Score '[]'$  - Use when only an EandM is performed on FOLLOW-UP visit 0 ASSESSMENTS - Nursing Assessment / Reassessment X - Reassessment of Co-morbidities (includes updates in patient status) 1 10 X- 1 5 Reassessment of Adherence to Treatment Plan ASSESSMENTS - Wound and Skin Assessment /  Reassessment X - Simple Wound Assessment / Reassessment - one wound 1 5 '[]'$  - 0 Complex Wound Assessment / Reassessment - multiple wounds '[]'$  - 0 Dermatologic / Skin Assessment (not related to wound area) ASSESSMENTS - Focused Assessment '[]'$  - Circumferential Edema Measurements - multi extremities 0 '[]'$  - 0 Nutritional Assessment / Counseling / Intervention '[]'$  - 0 Lower Extremity Assessment (monofilament, tuning fork, pulses) '[]'$  - 0 Peripheral Arterial Disease Assessment (using hand held doppler) ASSESSMENTS - Ostomy and/or Continence Assessment and Care '[]'$  - Incontinence Assessment and Management 0 '[]'$  - 0 Ostomy Care Assessment and Management (repouching, etc.) PROCESS - Coordination of Care X - Simple Patient / Family Education for ongoing care 1 15 '[]'$  - 0 Complex (extensive) Patient / Family Education for ongoing care '[]'$  - 0 Staff obtains Programmer, systems, Records, Test Results / Process Orders '[]'$  - 0 Staff telephones HHA, Nursing Homes / Clarify orders / etc '[]'$  - 0 Routine Transfer to another Facility (non-emergent condition) '[]'$  - 0 Routine Hospital Admission (non-emergent condition) '[]'$  - 0 New Admissions / Biomedical engineer / Ordering NPWT, Apligraf, etc. '[]'$  - 0 Emergency Hospital Admission (emergent condition) X- 1 10 Simple Discharge Coordination '[]'$  - 0 Complex (extensive) Discharge Coordination PROCESS - Special Needs '[]'$  - Pediatric / Minor Patient Management 0 '[]'$  - 0 Isolation Patient Management '[]'$  - 0 Hearing / Language / Visual special needs '[]'$  - 0 Assessment of Community assistance (transportation, D/C planning, etc.) '[]'$  - 0 Additional assistance / Altered mentation '[]'$  - 0 Support Surface(s) Assessment (bed, cushion, seat, etc.) INTERVENTIONS - Wound Cleansing / Measurement Maraj, Jamilah C. ([id number]) X- 1 5 Simple Wound Cleansing - one wound '[]'$  - 0 Complex Wound Cleansing - multiple wounds X- 1 5 Wound Imaging (photographs - any number of wounds) '[]'$   - 0 Wound Tracing (instead of photographs) X- 1 5 Simple Wound Measurement - one wound '[]'$  - 0  Complex Wound Measurement - multiple wounds INTERVENTIONS - Wound Dressings '[]'$  - Small Wound Dressing one or multiple wounds 0 X- 1 15 Medium Wound Dressing one or multiple wounds '[]'$  - 0 Large Wound Dressing one or multiple wounds '[]'$  - 0 Application of Medications - topical '[]'$  - 0 Application of Medications - injection INTERVENTIONS - Miscellaneous '[]'$  - External ear exam 0 '[]'$  - 0 Specimen Collection (cultures, biopsies, blood, body fluids, etc.) '[]'$  - 0 Specimen(s) / Culture(s) sent or taken to Lab for analysis '[]'$  - 0 Patient Transfer (multiple staff / Civil Service fast streamer / Similar devices) '[]'$  - 0 Simple Staple / Suture removal (25 or less) '[]'$  - 0 Complex Staple / Suture removal (26 or more) '[]'$  - 0 Hypo / Hyperglycemic Management (close monitor of Blood Glucose) '[]'$  - 0 Ankle / Brachial Index (ABI) - do not check if billed separately X- 1 5 Vital Signs Has the patient been seen at the hospital within the last three years: Yes Total Score: 80 Level Of Care: New/Established - Level 3 Electronic Signature(s) Signed: 02/27/2022 12:46:44 PM By: Massie Kluver Entered By: Massie Kluver on 02/27/2022 10:27:48 [name] ([id number]) -------------------------------------------------------------------------------- Encounter Discharge Information Details Patient Name: [name]. Date of Service: 02/27/2022 9:45 AM Medical Record Number: [id number] Patient Account Number: [id number] Date of Birth/Sex: 1955-11-07 (66 y.o. F) Treating RN: Carlene Coria Primary Care Hiroshi Krummel: Ranae Plumber Other Clinician: Massie Kluver Referring Kentavious Michele: Ranae Plumber Treating Posie Lillibridge/Extender: Skipper Cliche in Treatment: 30 Encounter Discharge Information Items Discharge Condition: Stable Ambulatory Status: Ambulatory Discharge Destination: Home Transportation: Private Auto Accompanied  By: self Schedule Follow-up Appointment: No Clinical Summary of Care: Electronic Signature(s) Signed: 02/28/2022 11:54:59 AM By: Massie Kluver Entered By: Massie Kluver on 02/27/2022 13:21:26 Tatsch, [name] ([id number]) -------------------------------------------------------------------------------- Lower Extremity Assessment Details Patient Name: [name] Date of Service: 02/27/2022 9:45 AM Medical Record Number: [id number] Patient Account Number: [id number] Date of Birth/Sex: Oct 31, 1955 (66 y.o. F) Treating RN: Carlene Coria Primary Care Dionta Larke: Ranae Plumber Other Clinician: Massie Kluver Referring Saskia Simerson: Ranae Plumber Treating Samirah Scarpati/Extender: Skipper Cliche in Treatment: 30 Edema Assessment Assessed: [Left: Yes] [Right: No] Edema: [Left: Ye] [Right: s] Calf Left: Right: Point of Measurement: 34 cm From Medial Instep 90 cm Ankle Left: Right: Point of Measurement: 10 cm From Medial Instep 57 cm Vascular Assessment Pulses: Dorsalis Pedis Palpable: [Left:Yes] Electronic Signature(s) Signed: 02/27/2022 12:46:44 PM By: Massie Kluver Signed: 03/01/2022 4:19:23 PM By: Carlene Coria RN Entered By: Massie Kluver on 02/27/2022 10:10:00 Gaba, [name] ([id number]) -------------------------------------------------------------------------------- Multi Wound Chart Details Patient Name: [name] Date of Service: 02/27/2022 9:45 AM Medical Record Number: [id number] Patient Account Number: [id number] Date of Birth/Sex: [birth year] (66 y.o. F) Treating RN: Carlene Coria Primary Care Jamion Carter: Ranae Plumber Other Clinician: Massie Kluver Referring Meleni Delahunt: Ranae Plumber Treating Loveda Colaizzi/Extender: Skipper Cliche in Treatment: 30 Vital Signs Height(in): 29 Pulse(bpm): 40 Weight(lbs): 782 Blood Pressure(mmHg): 132/78 Body Mass Index(BMI): 65.9 Temperature(F): 98.3 Respiratory Rate(breaths/min): 18 Photos: [N/A:N/A] Wound Location: Left,  Medial, Anterior Lower Leg N/A N/A Wounding Event: Gradually Appeared N/A N/A Primary Etiology: Lymphedema N/A N/A Comorbid History: Cataracts, Chronic sinus N/A N/A problems/congestion, Anemia, Lymphedema, Asthma, Sleep Apnea, Tuberculosis, Angina, Hypertension, Gout, Osteoarthritis Date Acquired: 10/17/2021 N/A N/A Weeks of Treatment: 19 N/A N/A Wound Status: Open N/A N/A Wound Recurrence: No N/A N/A Measurements L x W x D (cm) 1x2.5x0.1 N/A N/A Area (cm) : 1.963 N/A N/A Volume (cm) : 0.196 N/A N/A %  Reduction in Area: 75.00% N/A N/A % Reduction in Volume: 75.00% N/A N/A Classification: Full Thickness Without Exposed N/A N/A Support Structures Exudate Amount: Medium N/A N/A Exudate Type: Serosanguineous N/A N/A Exudate Color: red, brown N/A N/A Granulation Amount: Medium (34-66%) N/A N/A Granulation Quality: Red N/A N/A Necrotic Amount: Small (1-33%) N/A N/A Exposed Structures: Fat Layer (Subcutaneous Tissue): N/A N/A Yes Epithelialization: Large (67-100%) N/A N/A Treatment Notes Electronic Signature(s) Signed: 02/27/2022 12:46:44 PM By: Massie Kluver Entered By: Massie Kluver on 02/27/2022 10:10:20 [name] ([id number]) -------------------------------------------------------------------------------- Multi-Disciplinary Care Plan Details Patient Name: [name]. Date of Service: 02/27/2022 9:45 AM Medical Record Number: [id number] Patient Account Number: [id number] Date of Birth/Sex: 1955/07/25 (66 y.o. F) Treating RN: Carlene Coria Primary Care Marae Cottrell: Ranae Plumber Other Clinician: Massie Kluver Referring Nawaf Strange: Ranae Plumber Treating Leeam Cedrone/Extender: Skipper Cliche in Treatment: 30 Active Inactive Wound/Skin Impairment Nursing Diagnoses: Impaired tissue integrity Knowledge deficit related to ulceration/compromised skin integrity Goals: Ulcer/skin breakdown will have a volume reduction of 30% by week 4 Date Initiated:  08/01/2021 Target Resolution Date: 08/29/2021 Goal Status: Active Ulcer/skin breakdown will have a volume reduction of 50% by week 8 Date Initiated: 08/01/2021 Target Resolution Date: 09/26/2021 Goal Status: Active Ulcer/skin breakdown will have a volume reduction of 80% by week 12 Date Initiated: 08/01/2021 Target Resolution Date: 10/24/2021 Goal Status: Active Ulcer/skin breakdown will heal within 14 weeks Date Initiated: 08/01/2021 Target Resolution Date: 11/07/2021 Goal Status: Active Interventions: Assess patient/caregiver ability to obtain necessary supplies Assess patient/caregiver ability to perform ulcer/skin care regimen upon admission and as needed Assess ulceration(s) every visit Provide education on ulcer and skin care Notes: Electronic Signature(s) Signed: 02/27/2022 12:46:44 PM By: Massie Kluver Signed: 03/01/2022 4:19:23 PM By: Carlene Coria RN Entered By: Massie Kluver on 02/27/2022 10:10:05 Tauer, [name] ([id number]) -------------------------------------------------------------------------------- Pain Assessment Details Patient Name: [name]. Date of Service: 02/27/2022 9:45 AM Medical Record Number: [id number] Patient Account Number: [id number] Date of Birth/Sex: [birth year] (66 y.o. F) Treating RN: Carlene Coria Primary Care Nicloe Frontera: Ranae Plumber Other Clinician: Massie Kluver Referring Verlyn Dannenberg: Ranae Plumber Treating Domenico Achord/Extender: Skipper Cliche in Treatment: 30 Active Problems Location of Pain Severity and Description of Pain Patient Has Paino No Site Locations Pain Management and Medication Current Pain Management: Electronic Signature(s) Signed: 02/27/2022 12:46:44 PM By: Massie Kluver Signed: 03/01/2022 4:19:23 PM By: Carlene Coria RN Entered By: Massie Kluver on 02/27/2022 09:59:01 Ulin, [name] ([id number]) -------------------------------------------------------------------------------- Patient/Caregiver Education  Details Patient Name: [name] Date of Service: 02/27/2022 9:45 AM Medical Record Number: [id number] Patient Account Number: [id number] Date of Birth/Gender: 12-18-1955 (66 y.o. F) Treating RN: Carlene Coria Primary Care Physician: Ranae Plumber Other Clinician: Massie Kluver Referring Physician: Ranae Plumber Treating Physician/Extender: Skipper Cliche in Treatment: 30 Education Assessment Education Provided To: Patient Education Topics Provided Wound/Skin Impairment: Handouts: Other: continue wound care as directed Methods: Explain/Verbal Responses: State content correctly Electronic Signature(s) Signed: 02/27/2022 12:46:44 PM By: Massie Kluver Entered By: Massie Kluver on 02/27/2022 10:28:14 Edmundson, [name] ([id number]) -------------------------------------------------------------------------------- Wound Assessment Details Patient Name: [name] Date of Service: 02/27/2022 9:45 AM Medical Record Number: [id number] Patient Account Number: [id number] Date of Birth/Sex: [birth year] (66 y.o. F) Treating RN: Carlene Coria Primary Care Canna Nickelson: Ranae Plumber Other Clinician: Massie Kluver Referring Aden Sek: Ranae Plumber Treating Gaila Engebretsen/Extender: Skipper Cliche in Treatment: 30 Wound Status Wound Number: 10 Primary Lymphedema Etiology: Wound Location: Left, Medial, Anterior Lower Leg Wound Open Wounding Event: Gradually Appeared Status:  Date Acquired: 10/17/2021 Comorbid Cataracts, Chronic sinus problems/congestion, Anemia, Weeks Of Treatment: 19 History: Lymphedema, Asthma, Sleep Apnea, Tuberculosis, Angina, Clustered Wound: No Hypertension, Gout, Osteoarthritis Photos Wound Measurements Length: (cm) 1 Width: (cm) 2.5 Depth: (cm) 0.1 Area: (cm) 1.963 Volume: (cm) 0.196 % Reduction in Area: 75% % Reduction in Volume: 75% Epithelialization: Large (67-100%) Wound Description Classification: Full Thickness Without Exposed Support  Structu Exudate Amount: Medium Exudate Type: Serosanguineous Exudate Color: red, brown res Foul Odor After Cleansing: No Slough/Fibrino Yes Wound Bed Granulation Amount: Medium (34-66%) Exposed Structure Granulation Quality: Red Fat Layer (Subcutaneous Tissue) Exposed: Yes Necrotic Amount: Small (1-33%) Treatment Notes Wound #10 (Lower Leg) Wound Laterality: Left, Medial, Anterior Cleanser Soap and Water Discharge Instruction: Gently cleanse wound with antibacterial soap, rinse and pat dry prior to dressing wounds Peri-Wound Care Topical Primary Dressing SILVERCEL Antimicrobial Alginate Dressing, 1x12 (in/in) Molla, Zailey C. ([id number]) Secondary Dressing ABD Pad 5x9 (in/in) Discharge Instruction: Cover with ABD pad Annetta South Roll-Medium Discharge Instruction: [place] as directed Secured With ACE WRAP - 29M ACE Elastic Bandage With VELCRO Brand Closure, 4 (in) Compression Wrap Compression Stockings Add-Ons Electronic Signature(s) Signed: 02/27/2022 12:46:44 PM By: Massie Kluver Signed: 03/01/2022 4:19:23 PM By: Carlene Coria RN Entered By: Massie Kluver on 02/27/2022 10:08:09 [place]emurro, [name] ([id number]) -------------------------------------------------------------------------------- Vitals Details Patient Name: [name]. Date of Service: 02/27/2022 9:45 AM Medical Record Number: [id number] Patient Account Number: [id number] Date of Birth/Sex: Apr 16, 1956 (66 y.o. F) Treating RN: Carlene Coria Primary Care Anani Gu: Ranae Plumber Other Clinician: Massie Kluver Referring Dahlila Pfahler: Ranae Plumber Treating Danilyn Cocke/Extender: Skipper Cliche in Treatment: 30 Vital Signs Time Taken: 09:56 Temperature (F): 98.3 Height (in): 63 Pulse (bpm): 81 Weight (lbs): 372 Respiratory Rate (breaths/min): 18 Body Mass Index (BMI): 65.9 Blood Pressure (mmHg): 132/78 Reference Range: 80 - 120 mg / dl Electronic  Signature(s) Signed: 02/27/2022 12:46:44 PM By: Massie Kluver Entered By: Massie Kluver on 02/27/2022 09:58:51

## 2022-03-06 ENCOUNTER — Ambulatory Visit: Payer: Medicare HMO | Attending: Family | Admitting: Family

## 2022-03-06 ENCOUNTER — Telehealth: Payer: Self-pay | Admitting: Family

## 2022-03-06 ENCOUNTER — Encounter: Payer: Self-pay | Admitting: Family

## 2022-03-06 ENCOUNTER — Encounter: Payer: Medicare HMO | Admitting: Physician Assistant

## 2022-03-06 VITALS — BP 137/82 | HR 73 | Resp 16 | Ht 63.0 in | Wt 397.5 lb

## 2022-03-06 DIAGNOSIS — G4730 Sleep apnea, unspecified: Secondary | ICD-10-CM | POA: Diagnosis not present

## 2022-03-06 DIAGNOSIS — I890 Lymphedema, not elsewhere classified: Secondary | ICD-10-CM | POA: Diagnosis not present

## 2022-03-06 DIAGNOSIS — R5383 Other fatigue: Secondary | ICD-10-CM | POA: Insufficient documentation

## 2022-03-06 DIAGNOSIS — J45909 Unspecified asthma, uncomplicated: Secondary | ICD-10-CM | POA: Diagnosis not present

## 2022-03-06 DIAGNOSIS — I10 Essential (primary) hypertension: Secondary | ICD-10-CM | POA: Diagnosis not present

## 2022-03-06 DIAGNOSIS — N183 Chronic kidney disease, stage 3 unspecified: Secondary | ICD-10-CM | POA: Insufficient documentation

## 2022-03-06 DIAGNOSIS — F419 Anxiety disorder, unspecified: Secondary | ICD-10-CM | POA: Insufficient documentation

## 2022-03-06 DIAGNOSIS — R002 Palpitations: Secondary | ICD-10-CM | POA: Insufficient documentation

## 2022-03-06 DIAGNOSIS — Z9989 Dependence on other enabling machines and devices: Secondary | ICD-10-CM

## 2022-03-06 DIAGNOSIS — Z79899 Other long term (current) drug therapy: Secondary | ICD-10-CM | POA: Diagnosis not present

## 2022-03-06 DIAGNOSIS — Z87891 Personal history of nicotine dependence: Secondary | ICD-10-CM | POA: Insufficient documentation

## 2022-03-06 DIAGNOSIS — I130 Hypertensive heart and chronic kidney disease with heart failure and stage 1 through stage 4 chronic kidney disease, or unspecified chronic kidney disease: Secondary | ICD-10-CM | POA: Insufficient documentation

## 2022-03-06 DIAGNOSIS — E669 Obesity, unspecified: Secondary | ICD-10-CM | POA: Insufficient documentation

## 2022-03-06 DIAGNOSIS — I5032 Chronic diastolic (congestive) heart failure: Secondary | ICD-10-CM

## 2022-03-06 DIAGNOSIS — M1990 Unspecified osteoarthritis, unspecified site: Secondary | ICD-10-CM | POA: Diagnosis not present

## 2022-03-06 DIAGNOSIS — G4733 Obstructive sleep apnea (adult) (pediatric): Secondary | ICD-10-CM

## 2022-03-06 DIAGNOSIS — Q820 Hereditary lymphedema: Secondary | ICD-10-CM | POA: Diagnosis not present

## 2022-03-06 MED ORDER — BUMETANIDE 2 MG PO TABS: 2.0000 mg | ORAL_TABLET | Freq: Every day | ORAL | 5 refills | Status: DC

## 2022-03-06 NOTE — Telephone Encounter (Signed)
Faxed a referral for [facility] as they stated they have not seen the several referrals we sent through epic. They stated they would keep an eye out for it and reach out to patient.   Fredrico Beedle, NT

## 2022-03-06 NOTE — Patient Instructions (Addendum)
Continue weighing daily and call for an overnight weight gain of 3 pounds or more or a weekly weight gain of more than 5 pounds.   If you have voicemail, please make sure your mailbox is cleaned out so that we may leave a message and please make sure to listen to any voicemails.    Stop taking furosemide (lasix) and begin taking bumex '2mg'$  daily. I will recheck your labs in a month

## 2022-03-06 NOTE — Progress Notes (Signed)
WEI, [place]BR[place] ([id number]) Visit Report for 03/06/2022 Arrival Information Details Patient Name: [name], [name]. Date of Service: 03/06/2022 9:45 AM Medical Record Number: [id number] Patient Account Number: [id number] Date of Birth/Sex: January 16, 1956 (66 y.o. F) Treating RN: Cornell Barman Primary Care Jeyda Siebel: Ranae Plumber Other Clinician: Massie Kluver Referring Jiah Bari: Ranae Plumber Treating Kaisa Wofford/Extender: Skipper Cliche in Treatment: 28 Visit Information History Since Last Visit All ordered tests and consults were completed: No Patient Arrived: [name] Added or deleted any medications: No Arrival Time: 09:50 Any new allergies or adverse reactions: No Transfer Assistance: None Had a fall or experienced change in No Patient Has Alerts: Yes activities of daily living that may affect Patient Alerts: Borderline Diabetic risk of falls: Hospitalized since last visit: No Pain Present Now: Yes Electronic Signature(s) Signed: 03/06/2022 2:32:39 PM By: Massie Kluver Entered By: Massie Kluver on 03/06/2022 09:51:01 Defrain, [name] ([id number]) -------------------------------------------------------------------------------- Clinic Level of Care Assessment Details Patient Name: [name]. Date of Service: 03/06/2022 9:45 AM Medical Record Number: [id number] Patient Account Number: [id number] Date of Birth/Sex: [birth year] (66 y.o. F) Treating RN: Cornell Barman Primary Care Mann Skaggs: Ranae Plumber Other Clinician: Massie Kluver Referring Alondra Sahni: Ranae Plumber Treating Tehani Mersman/Extender: Skipper Cliche in Treatment: 31 Clinic Level of Care Assessment Items TOOL 4 Quantity Score '[]'$  - Use when only an EandM is performed on FOLLOW-UP visit 0 ASSESSMENTS - Nursing Assessment / Reassessment X - Reassessment of Co-morbidities (includes updates in patient status) 1 10 X- 1 5 Reassessment of Adherence to Treatment Plan ASSESSMENTS - Wound and Skin Assessment /  Reassessment X - Simple Wound Assessment / Reassessment - one wound 1 5 '[]'$  - 0 Complex Wound Assessment / Reassessment - multiple wounds '[]'$  - 0 Dermatologic / Skin Assessment (not related to wound area) ASSESSMENTS - Focused Assessment '[]'$  - Circumferential Edema Measurements - multi extremities 0 '[]'$  - 0 Nutritional Assessment / Counseling / Intervention '[]'$  - 0 Lower Extremity Assessment (monofilament, tuning fork, pulses) '[]'$  - 0 Peripheral Arterial Disease Assessment (using hand held doppler) ASSESSMENTS - Ostomy and/or Continence Assessment and Care '[]'$  - Incontinence Assessment and Management 0 '[]'$  - 0 Ostomy Care Assessment and Management (repouching, etc.) PROCESS - Coordination of Care X - Simple Patient / Family Education for ongoing care 1 15 '[]'$  - 0 Complex (extensive) Patient / Family Education for ongoing care '[]'$  - 0 Staff obtains Programmer, systems, Records, Test Results / Process Orders '[]'$  - 0 Staff telephones HHA, Nursing Homes / Clarify orders / etc '[]'$  - 0 Routine Transfer to another Facility (non-emergent condition) '[]'$  - 0 Routine Hospital Admission (non-emergent condition) '[]'$  - 0 New Admissions / Biomedical engineer / Ordering NPWT, Apligraf, etc. '[]'$  - 0 Emergency Hospital Admission (emergent condition) X- 1 10 Simple Discharge Coordination '[]'$  - 0 Complex (extensive) Discharge Coordination PROCESS - Special Needs '[]'$  - Pediatric / Minor Patient Management 0 '[]'$  - 0 Isolation Patient Management '[]'$  - 0 Hearing / Language / Visual special needs '[]'$  - 0 Assessment of Community assistance (transportation, D/C planning, etc.) '[]'$  - 0 Additional assistance / Altered mentation '[]'$  - 0 Support Surface(s) Assessment (bed, cushion, seat, etc.) INTERVENTIONS - Wound Cleansing / Measurement Ehrlich, Lillyona C. ([id number]) X- 1 5 Simple Wound Cleansing - one wound '[]'$  - 0 Complex Wound Cleansing - multiple wounds X- 1 5 Wound Imaging (photographs - any number of wounds) '[]'$   - 0 Wound Tracing (instead of photographs) X- 1 5 Simple Wound Measurement - one wound '[]'$  - 0  Complex Wound Measurement - multiple wounds INTERVENTIONS - Wound Dressings '[]'$  - Small Wound Dressing one or multiple wounds 0 X- 1 15 Medium Wound Dressing one or multiple wounds '[]'$  - 0 Large Wound Dressing one or multiple wounds '[]'$  - 0 Application of Medications - topical '[]'$  - 0 Application of Medications - injection INTERVENTIONS - Miscellaneous '[]'$  - External ear exam 0 '[]'$  - 0 Specimen Collection (cultures, biopsies, blood, body fluids, etc.) '[]'$  - 0 Specimen(s) / Culture(s) sent or taken to Lab for analysis '[]'$  - 0 Patient Transfer (multiple staff / Civil Service fast streamer / Similar devices) '[]'$  - 0 Simple Staple / Suture removal (25 or less) '[]'$  - 0 Complex Staple / Suture removal (26 or more) '[]'$  - 0 Hypo / Hyperglycemic Management (close monitor of Blood Glucose) '[]'$  - 0 Ankle / Brachial Index (ABI) - do not check if billed separately X- 1 5 Vital Signs Has the patient been seen at the hospital within the last three years: Yes Total Score: 80 Level Of Care: New/Established - Level 3 Electronic Signature(s) Signed: 03/06/2022 2:32:39 PM By: Massie Kluver Entered By: Massie Kluver on 03/06/2022 10:20:19 Krempasky, [name] ([id number]) -------------------------------------------------------------------------------- Encounter Discharge Information Details Patient Name: [name]. Date of Service: 03/06/2022 9:45 AM Medical Record Number: [id number] Patient Account Number: [id number] Date of Birth/Sex: 1955/07/18 (66 y.o. F) Treating RN: Cornell Barman Primary Care Rihana Kiddy: Ranae Plumber Other Clinician: Massie Kluver Referring Harce Volden: Ranae Plumber Treating Najeeb Uptain/Extender: Skipper Cliche in Treatment: 31 Encounter Discharge Information Items Discharge Condition: Stable Ambulatory Status: Cane Discharge Destination: Home Transportation: Private Auto Accompanied By:  self Schedule Follow-up Appointment: Yes Clinical Summary of Care: Electronic Signature(s) Signed: 03/06/2022 2:32:39 PM By: Massie Kluver Entered By: Massie Kluver on 03/06/2022 10:35:57 Peabody, [name] ([id number]) -------------------------------------------------------------------------------- Lower Extremity Assessment Details Patient Name: [name]. Date of Service: 03/06/2022 9:45 AM Medical Record Number: [id number] Patient Account Number: [id number] Date of Birth/Sex: 02/04/1956 (66 y.o. F) Treating RN: Cornell Barman Primary Care Dayanis Bergquist: Ranae Plumber Other Clinician: Massie Kluver Referring Jakya Dovidio: Ranae Plumber Treating Devota Viruet/Extender: Skipper Cliche in Treatment: 31 Edema Assessment Assessed: [Left: Yes] [Right: No] Edema: [Left: Ye] [Right: s] Calf Left: Right: Point of Measurement: 34 cm From Medial Instep 79.4 cm Ankle Left: Right: Point of Measurement: 10 cm From Medial Instep 57.5 cm Vascular Assessment Pulses: Dorsalis Pedis Palpable: [Left:Yes] Electronic Signature(s) Signed: 03/06/2022 2:32:39 PM By: Massie Kluver Signed: 03/06/2022 2:53:55 PM By: Gretta Cool, BSN, RN, CWS, Kim RN, BSN Entered By: Massie Kluver on 03/06/2022 10:07:25 Modi, [name] ([id number]) -------------------------------------------------------------------------------- Multi Wound Chart Details Patient Name: [name] Date of Service: 03/06/2022 9:45 AM Medical Record Number: [id number] Patient Account Number: [id number] Date of Birth/Sex: [birth year] (66 y.o. F) Treating RN: Cornell Barman Primary Care Jiah Bari: Ranae Plumber Other Clinician: Massie Kluver Referring Jameal Razzano: Ranae Plumber Treating Goro Wenrick/Extender: Skipper Cliche in Treatment: 31 Vital Signs Height(in): 52 Pulse(bpm): 62 Weight(lbs): 372 Blood Pressure(mmHg): 111/71 Body Mass Index(BMI): 65.9 Temperature(F): 98.3 Respiratory Rate(breaths/min): 18 Photos: [10:No Photos]  [N/A:N/A] Wound Location: [10:Left, Medial, Anterior Lower Leg] [N/A:N/A] Wounding Event: [10:Gradually Appeared] [N/A:N/A] Primary Etiology: [10:Lymphedema] [N/A:N/A] Comorbid History: [10:Cataracts, Chronic sinus problems/congestion, Anemia, Lymphedema, Asthma, Sleep Apnea, Tuberculosis, Angina, Hypertension, Gout, Osteoarthritis] [N/A:N/A] Date Acquired: [10:10/17/2021] [N/A:N/A] Weeks of Treatment: [10:20] [N/A:N/A] Wound Status: [10:Open] [N/A:N/A] Wound Recurrence: [10:No] [N/A:N/A] Measurements L x W x D (cm) [10:0.6x0.7x0.1] [N/A:N/A] Area (cm) : [10:0.33] [N/A:N/A] Volume (cm) : [10:0.033] [N/A:N/A] % Reduction in Area: [10:95.80%] [N/A:N/A] %  Reduction in Volume: [10:95.80%] [N/A:N/A] Classification: [10:Full Thickness Without Exposed Support Structures] [N/A:N/A] Exudate Amount: [10:Medium] [N/A:N/A] Exudate Type: [10:Serosanguineous] [N/A:N/A] Exudate Color: [10:red, brown] [N/A:N/A] Granulation Amount: [10:Medium (34-66%)] [N/A:N/A] Granulation Quality: [10:Red] [N/A:N/A] Necrotic Amount: [10:Small (1-33%)] [N/A:N/A] Exposed Structures: [10:Fat Layer (Subcutaneous Tissue): Yes Large (67-100%)] [N/A:N/A N/A] Treatment Notes Electronic Signature(s) Signed: 03/06/2022 2:32:39 PM By: Massie Kluver Entered By: Massie Kluver on 03/06/2022 10:07:35 [name] ([id number]) -------------------------------------------------------------------------------- Multi-Disciplinary Care Plan Details Patient Name: [name]. Date of Service: 03/06/2022 9:45 AM Medical Record Number: [id number] Patient Account Number: [id number] Date of Birth/Sex: April 14, 1956 (66 y.o. F) Treating RN: Cornell Barman Primary Care Prabhnoor Ellenberger: Ranae Plumber Other Clinician: Massie Kluver Referring Jasara Corrigan: Ranae Plumber Treating Vaanya Shambaugh/Extender: Skipper Cliche in Treatment: 31 Active Inactive Wound/Skin Impairment Nursing Diagnoses: Impaired tissue integrity Knowledge deficit related  to ulceration/compromised skin integrity Goals: Ulcer/skin breakdown will have a volume reduction of 30% by week 4 Date Initiated: 08/01/2021 Target Resolution Date: 08/29/2021 Goal Status: Active Ulcer/skin breakdown will have a volume reduction of 50% by week 8 Date Initiated: 08/01/2021 Target Resolution Date: 09/26/2021 Goal Status: Active Ulcer/skin breakdown will have a volume reduction of 80% by week 12 Date Initiated: 08/01/2021 Target Resolution Date: 10/24/2021 Goal Status: Active Ulcer/skin breakdown will heal within 14 weeks Date Initiated: 08/01/2021 Target Resolution Date: 11/07/2021 Goal Status: Active Interventions: Assess patient/caregiver ability to obtain necessary supplies Assess patient/caregiver ability to perform ulcer/skin care regimen upon admission and as needed Assess ulceration(s) every visit Provide education on ulcer and skin care Notes: Electronic Signature(s) Signed: 03/06/2022 2:32:39 PM By: Massie Kluver Signed: 03/06/2022 2:53:55 PM By: Gretta Cool, BSN, RN, CWS, Kim RN, BSN Entered By: Massie Kluver on 03/06/2022 10:07:29 [name] ([id number]) -------------------------------------------------------------------------------- Pain Assessment Details Patient Name: JANNIS, ATKINS C. Date of Service: 03/06/2022 9:45 AM Medical Record Number: [id number] Patient Account Number: [id number] Date of Birth/Sex: 11-05-1955 (66 y.o. F) Treating RN: Cornell Barman Primary Care Jaymian Bogart: Ranae Plumber Other Clinician: Massie Kluver Referring Cozette Braggs: Ranae Plumber Treating Rashod Gougeon/Extender: Skipper Cliche in Treatment: 31 Active Problems Location of Pain Severity and Description of Pain Patient Has Paino Yes Site Locations Pain Location: Generalized Pain Duration of the Pain. Constant / Intermittento Constant Rate the pain. Current Pain Level: 5 Character of Pain Describe the Pain: Cramping, Other: numb, itchy Pain Management and  Medication Current Pain Management: Medication: Yes Rest: Yes How does your wound impact your activities of daily livingo Sleep: Yes Notes pain mostly in right 2nd toe Electronic Signature(s) Signed: 03/06/2022 2:32:39 PM By: Massie Kluver Signed: 03/06/2022 2:53:55 PM By: Gretta Cool, BSN, RN, CWS, Kim RN, BSN Entered By: Massie Kluver on 03/06/2022 09:58:04 [name] ([id number]) -------------------------------------------------------------------------------- Patient/Caregiver Education Details Patient Name: [name]. Date of Service: 03/06/2022 9:45 AM Medical Record Number: [id number] Patient Account Number: [id number] Date of Birth/Gender: 10/27/1955 (66 y.o. F) Treating RN: Cornell Barman Primary Care Physician: Ranae Plumber Other Clinician: Massie Kluver Referring Physician: Ranae Plumber Treating Physician/Extender: Skipper Cliche in Treatment: 31 Education Assessment Education Provided To: Patient Education Topics Provided Infection: Handouts: Other: start antibiotic as directed Methods: Explain/Verbal Responses: State content correctly Wound/Skin Impairment: Handouts: Other: continue wound care as directed Methods: Explain/Verbal Responses: State content correctly Electronic Signature(s) Signed: 03/06/2022 2:32:39 PM By: Massie Kluver Entered By: Massie Kluver on 03/06/2022 10:21:07 Nowack, [name] ([id number]) -------------------------------------------------------------------------------- Wound Assessment Details Patient Name: [name] Date of Service: 03/06/2022 9:45 AM Medical Record Number: [id number] Patient Account Number: [id number] Date  of Birth/Sex: 09-21-1955 (66 y.o. F) Treating RN: Cornell Barman Primary Care Peregrine Nolt: Ranae Plumber Other Clinician: Massie Kluver Referring Kenya Shiraishi: Ranae Plumber Treating Jaqulyn Chancellor/Extender: Skipper Cliche in Treatment: 31 Wound Status Wound Number: 10 Primary  Lymphedema Etiology: Wound Location: Left, Medial, Anterior Lower Leg Wound Open Wounding Event: Gradually Appeared Status: Date Acquired: 10/17/2021 Comorbid Cataracts, Chronic sinus problems/congestion, Anemia, Weeks Of Treatment: 20 History: Lymphedema, Asthma, Sleep Apnea, Tuberculosis, Angina, Clustered Wound: No Hypertension, Gout, Osteoarthritis Photos Wound Measurements Length: (cm) 0.6 Width: (cm) 0.7 Depth: (cm) 0.1 Area: (cm) 0.33 Volume: (cm) 0.033 % Reduction in Area: 95.8% % Reduction in Volume: 95.8% Epithelialization: Large (67-100%) Wound Description Classification: Full Thickness Without Exposed Support Structu Exudate Amount: Medium Exudate Type: Serosanguineous Exudate Color: red, brown res Foul Odor After Cleansing: No Slough/Fibrino Yes Wound Bed Granulation Amount: Medium (34-66%) Exposed Structure Granulation Quality: Red Fat Layer (Subcutaneous Tissue) Exposed: Yes Necrotic Amount: Small (1-33%) Treatment Notes Wound #10 (Lower Leg) Wound Laterality: Left, Medial, Anterior Cleanser Soap and Water Discharge Instruction: Gently cleanse wound with antibacterial soap, rinse and pat dry prior to dressing wounds Peri-Wound Care Topical Primary Dressing SILVERCEL Antimicrobial Alginate Dressing, 1x12 (in/in) Abate, Shikira C. ([id number]) Secondary Dressing ABD Pad 5x9 (in/in) Discharge Instruction: Cover with ABD pad [place] Roll-Medium Discharge Instruction: [place] as directed Secured With ACE WRAP - 98M ACE Elastic Bandage With VELCRO Brand Closure, 4 (in) Compression Wrap Compression Stockings Add-Ons Electronic Signature(s) Signed: 03/06/2022 2:32:39 PM By: Massie Kluver Signed: 03/06/2022 2:53:55 PM By: Gretta Cool, BSN, RN, CWS, Kim RN, BSN Entered By: Massie Kluver on 03/06/2022 10:09:43 Pettie, [name]  ([id number]) -------------------------------------------------------------------------------- Vitals Details Patient Name: [name]. Date of Service: 03/06/2022 9:45 AM Medical Record Number: [id number] Patient Account Number: [id number] Date of Birth/Sex: 10-Sep-1955 (66 y.o. F) Treating RN: Cornell Barman Primary Care Marshun Duva: Ranae Plumber Other Clinician: Massie Kluver Referring Malan Werk: Ranae Plumber Treating Ilze Roselli/Extender: Skipper Cliche in Treatment: 31 Vital Signs Time Taken: 09:52 Temperature (F): 98.3 Height (in): 63 Pulse (bpm): 70 Weight (lbs): 372 Respiratory Rate (breaths/min): 18 Body Mass Index (BMI): 65.9 Blood Pressure (mmHg): 111/71 Reference Range: 80 - 120 mg / dl Electronic Signature(s) Signed: 03/06/2022 2:32:39 PM By: Massie Kluver Entered By: Massie Kluver on 03/06/2022 09:57:38

## 2022-03-06 NOTE — Progress Notes (Signed)
SAPHYRA, HUTT ([id number]) Visit Report for 03/06/2022 Chief Complaint Document Details Patient Name: [name], [name]. Date of Service: 03/06/2022 9:45 AM Medical Record Number: [id number] Patient Account Number: [id number] Date of Birth/Sex: Oct 07, 1955 (66 y.o. F) Treating RN: Cornell Barman Primary Care Provider: Ranae Plumber Other Clinician: Massie Kluver Referring Provider: Ranae Plumber Treating Provider/Extender: Skipper Cliche in Treatment: 31 Information Obtained from: Patient Chief Complaint Bilateral LE lymphedema with Left LE Ulcers Electronic Signature(s) Signed: 03/06/2022 9:52:32 AM By: Worthy Keeler PA-C Entered By: Worthy Keeler on 03/06/2022 09:52:32 [name] ([id number]) -------------------------------------------------------------------------------- HPI Details Patient Name: [name] Date of Service: 03/06/2022 9:45 AM Medical Record Number: [id number] Patient Account Number: [id number] Date of Birth/Sex: [birth year] (66 y.o. F) Treating RN: Cornell Barman Primary Care Provider: Ranae Plumber Other Clinician: Massie Kluver Referring Provider: Ranae Plumber Treating Provider/Extender: Skipper Cliche in Treatment: 34 History of Present Illness HPI Description: The patient is a 66 year old female with history of hypertension and a long-standing history of bilateral lower extremity lymphedema (first presented on 4/2) . She has had open ulcers in the past which have always responded to compression therapy. She had briefly been to a lymphedema clinic in the past which helped her at the time. this time around she stopped treatment of her lymphedema pumps approximately 2 weeks ago because of some pain in the knees and then noticed the right leg getting worse. She was seen by her PCP who put her on clindamycin 4 times a day 2 days ago. The patient has seen [facility] and Dr. Delana Meyer had seen her last year where a vascular study including venous and  arterial duplex studies were within normal limits. he had recommended compression stockings and lymphedema pumps and the patient has been using this in about 2 weeks ago. She is known to be diabetic but in the past few time she's gone to her primary care doctor her hemoglobin A1c has been normal. 02/11/2015 - after her last visit she took my advice and went to the ER regarding the progressive cellulitis of her right lower extremity and she was admitted between July 17 and 22nd. She received IV antibiotics and then was sent home on a course of steroid-induced and oral antibiotics. She has improved much since then. 02/17/2015 -- she has been doing fine and the weeping of her legs has remarkably gone down. She has no fresh issues. READMISSION 01/15/18 This patient was given this clinic before most recently in 2016 seen by Dr. Con Memos. She has massive bilateral lymphedema and over the last 2 months this had weeping edema out of the left leg. She has compression pumps but her compliance with these has been minimal. She has [facility] they've been using TCA/ABDs/kerlix under an Ace wrap.she has had recent problems with cellulitis. She was apparently seen in the ER and 12/23/17 and given clindamycin. She was then followed by her primary doctor and given doxycycline and Keflex. The pain seems to have settled down. In April 2018 the patient had arterial studies done at [facility]. This showed triphasic waveforms throughout the right leg and mostly triphasic waveforms on the left except for monophasic at the posterior tibial artery distally. She was not felt to have evidence of right lower extremity arterial stenosis or significant problems on the left side. 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 She also had a right lower extremity venous Doppler in January 2018 this was limited  by the patient's body habitus and lymphedema. Most of the proximal veins were  not visualized The patient presents with an area of denuded skin on the anterior medial part of the left calf. There is weeping edema fluid here. 01/22/18; the patient has somewhat better edema control using her compression pumps twice a day and as a result she has much better epithelialization on the left anterior calf area. Only a small open area remains. 01/29/18; the patient has been compliant with her compression pumps. Both the areas on her calf that healed. The remaining area on the left anterior leg is fully epithelialized Readmission: 02/20/2019 upon evaluation today patient presents for reevaluation due to issues that she is having with the bilateral lower extremities. She actually has wounds open on both legs. On the right she has an area in the crease of her leg on the right around the knee region which is actually draining quite a bit and actually has some fungal type appearance to it. She has been on nystatin powder that seems to have helped to some degree. In regard to the left lower extremity this is actually in the lower portion of her leg closer to the ankle and again is continuing to drain as well unfortunately. There does not appear to be any signs of active infection at this time which is good news. No fevers, chills, nausea, vomiting, or diarrhea. She tells me that since she was seen last year she is actually been doing quite well for the most part with regard to her lower extremities. Unfortunately she now is experiencing a little bit more drainage at this time. She is concerned about getting this under control so that it does not get significantly worse. 02/27/2019 on evaluation today patient appears to be doing somewhat better in regard to her bilateral lower extremity wounds. She has been tolerating the dressing changes without complication. Fortunately there is no signs of active infection at this point. No fevers, chills, nausea, vomiting, or diarrhea. She did get her  dressing supplies which is excellent news she was extremely excited to get these. She also got paperwork from [employer] for their financial assistance program where they may be able to help her out in the future if needed with supplies at discounted prices. 03/06/2019 on evaluation today patient appears to be doing a little worse with regard to both areas of weeping on her bilateral lower extremities. This is around the right medial knee and just above the left ankle. With that being said she is unfortunately not doing as well as I would like to see. I feel like she may need to potentially go see someone at the lymphedema clinic as the wraps that she needs or even beyond what we can do here at the wound care center. She really does not have wounds she just has open areas of weeping that are causing some difficulty for her. Subsequently because of this and the moisture I am concerned about the potential for infection I am going to likely give her a prophylactic antibiotic today, Keflex, just to be on the safe side. Nonetheless again there is no obvious signs of active infection at this time. 03/13/2019 on evaluation today patient appears to be doing well with regard to her bilateral lower extremities where she has been weeping compared to even last week's evaluation. I see some areas of new skin growth which is excellent and overall I am very pleased with how things seem to be progressing. No fevers, chills, nausea,  vomiting, or diarrhea. [name], [name] ([id number]) 03/20/2019 on evaluation today patient unfortunately is continuing to have issues with significant edema of the left lower extremity. Her right side seems to be doing much better. Unfortunately her left side is showing increased weeping of the lower portion of her leg. This is quite unfortunate obviously we were hoping to get her into the lymphedema clinic they really do not seem to when I see her how if she is draining. Despite the fact this is  really not wound related but more lymphedema weeping related. Nonetheless I do not know that this can be helpful for her to even go for that appointment since again I am not sure there is much that they would actually do at this point. We may need to try a 4 layer compression wrap as best we can on her leg. She is on the Augmentin currently although I am still concerned about whether or not there could be potentially something going on infection wise I would obtain a culture though I understand is not the best being that is a surface culture I just 1 to make sure I do not seem to be missing anything. 03/27/2019 on evaluation today patient appears to be doing much better in regard to the left lower extremity compared to last week. Last week she had tremendous weeping which I think was subsequent to infection now she seems to be doing much better and very pleased. This is not completely healed but there is a lot of new skin growth and it has dried out quite a bit. Overall I think that we are doing well with how things are moving along at this time. No fevers, chills, nausea, vomiting, or diarrhea. 04/03/2019 evaluation today patient appears to be doing a little worse this week compared to last time I saw her. I think this may be due to the fact that she is having issues with not being able to sleep in her bed at least not until last night. She is therefore been in a lift chair and subsequently has also had issues with not been able to use her pumps since she could not get in bed. With that being said the patient overall seems to be doing okay I do think I may want extend the antibiotic for a little bit longer at least until we can see if her edema and her weeping gets better and if it is then obviously I can always discontinue the antibiotics as of next week however I want her to continue to have it over the next week. 04/10/2019 on evaluation today patient unfortunately is still doing poorly with regard  to her left lower extremity. Her right is all things considering doing fairly well. On the left however she continues to have spreading of the area of infection and weeping which appears to be even a larger surface area than noted last week. She did have a positive culture for Pseudomonas in particular which seems to have been of concern she still has green/yellow discharge consistent with Pseudomonas and subsequently a tremendous amount of it. This has me obviously still concerned about the infection not really clearing up despite the fact that on culture it appears the Cipro should have been a good option for treating this. I think she may at this point need IV antibiotics since things are not doing better I do not want to get worse and cause sepsis. She is in agreement with the plan and believes as well that  she likely does need to go to the hospital for IV vancomycin. Or something of the like depending on what the recommendation is from the ER. 04/17/2019 on evaluation today patient appears to be doing excellent in regard to her lower extremity on the left. She was in the hospital for several days from when I sent her last we saw her until just this past Tuesday. Fortunately her drainage is significantly improved and in fact is mostly clear. There is just a couple small areas that may still drain a little bit she states that the [facility] they prescribed for her at discharge she went picked up from pharmacy and got home but has not been able to find it since. She is looked everywhere. She is wondering if I will replace that for her today I will be more than happy to do that. 05/01/2019 on evaluation today patient actually appears to be doing quite well with regard to her lower extremities. She occasionally is having areas that will leak and then heal up mainly when a piece of the fibrotic skin pops off but fortunately she is not having any signs of active infection at this time. Overall she also  really does not have any obvious weeping at this time. I do believe however she really needs some compression wraps and I think this may be a good time to get her back to the lymphedema clinic. 05/11/2019 on evaluation today patient actually appears to be doing quite well with regard to her bilateral lower extremities. She occasionally will have a small area that we per another but in general seems to be completely healed which is great news. Overall very pleased with how everything seems to be progressing. She does have her appointment with lymphedema clinic on November 18. 05/25/2019 on evaluation today patient appears to be doing well with regard to her left lower extremity. I am very pleased in this regard. In regard to her right leg this actually did start draining more I think it is mainly due to the fact that her leg is more swollen. I am not seeing any obvious signs of infection at this time although that is definitely something were obviously acutely aware of simply due to the fact that she had an issue not too far back with exactly this issue. Nonetheless I do feel like that lymphedema clinic would still be beneficial for her. I explained obviously if they are not able to do anything treatment wise on the right leg we could at least have them treat her left leg and then proceed from there. The patient is really in agreement with that plan. If they are able to do both as the drainage slows down that I would be happy to let them handle both. 06/01/2019 on evaluation today patient unfortunately appears to be doing worse with regard to her right lower extremity. The left lower extremity is still maintaining at this point. Unfortunately she has been having significantly increased pain over the past several days and has been experiencing as well increased swelling of the right lower extremity. I really do not know that I am seeing anything that appears to be obvious for infection at this point to  be peripherally honest. With that being said the patient does seem to be having much more swelling that she is even experienced in the past and coupled with increased pain in her hip as well I am concerned that again she could potentially have a DVT although I am not 100% sure  of this. I think it something that may need to be checked out. We discussed the possibility of sending her for a DVT study through the hospital but unfortunately transportation is an issue if she does have a DVT I do not want her to wait days to be able to get in for that test however if she has this scheduled as an outpatient that is as fast that she will be able to get the test scheduled for transportation purposes. That will also fall on Thanksgiving so subsequently she did actually be looking at either Friday or even next week before we would know anything back from this. That is much too long in my opinion. Subsequent to the amount of discomfort she is experiencing the patient is actually okay with going to the ER for evaluation today. 06/12/2019 on evaluation today patient actually appears to be doing significantly better compared to last time I saw her. Following when I last saw her she was actually in the hospital from that Monday until the following Sunday almost 1 full week. She actually was placed on Keflex in the hospital following the time for her to be discharged and Dr. Steva Ready has recommended 2 times a day dosing of the Keflex for the next year in order to help with more prophylactic/preventative measures with regard to her developing cellulitis. Overall I think this sounds like an excellent plan. The patient unfortunately is good to have trouble being treated at lymphedema clinic due to the fact that she really cannot get up on the bed that they have there. They also state that they cannot manage her as long as she has anything draining at this point. Obviously that is somewhat unfortunate as she does need help  with edema control but nonetheless we will have to do what we can for her outside of it sounds like the lymphedema clinic scenario at this point. 06/19/2019 on evaluation today patient appears to be doing fairly well with regard to her bilateral lower extremities. She is not nearly as swollen and shows no signs of infection at this point. There is no evidence of cellulitis whatsoever. She also has no open wounds or draining at this point which is also good news. No fever chills noted. She seems to be in very good spirits and in fact appears to be doing quite well. READMISSION 11/27/2019 TENEISHA, GIGNAC ([id number]) This is a 66 year old woman that we have had in this clinic several times before including 2015, 16 and 19 and then most recently from 03/20/2019 through 06/19/2019 with bilateral lower extremity lymphedema. She has had previous arterial and reflux studies done years ago which were not all that remarkable. 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 She does have a positive family history and she had large legs starting may be in her 69s. She was recently in hospital from 10/20/2019 through 10/28/2019 with right leg cellulitis. She was given Ancef and clindamycin and then Zosyn when a culture showed Pseudomonas. At that time there was purulent drainage. She was followed by infectious disease Dr Steva Ready. The patient is now back at home. She has noted increased swelling in the right and no drainage in her right leg mostly on the posterior medial aspect in the calf area. She has not had pain or fever. She has literally been improved lysing above dressings because her at the area of this is far too large for standard compression. She has been wrapping the areas  with sheets to resorptive pads. She is found these helped somewhat. She does have an appointment with the lymphedema clinic in [place] in late June. Past medical history  includes bilateral lymphedema, hypertension, obstructive sleep apnea with CPAP. Recent hospitalization with apparently Pseudomonas cellulitis of the right lower leg 12/15/2019 upon evaluation today patient appears to be doing a little bit worse in regard to her right lower extremity. Unfortunately she is having more weeping down in the lower portion of her leg. Fortunately there is no signs of active infection at this time. No fever chills noted. The patient states she is not having increased pain except for when she attempted to use the lymphedema pumps unfortunately she states that she did have pain when she did this. Otherwise we been using absorptive dressings of one type or another she is using diapers at home and then subsequently Ace wraps. In regard to the barrier cream we have discussed the possibility of derma cloud which she would like to try I do not have a problem with that. 12/22/2019 upon evaluation today patient actually appears to be doing better in regard to her leg ulcers at this point. Fortunately there does not appear to be any signs of active infection which is great news and I am extremely pleased with where things are progressing at this time. There is no sign of active infection currently. The patient is very pleased to see things doing so well. 12/29/2019 upon evaluation today patient appears to be doing a little bit better in regard to her weeping in general over her lower extremities. She does have some signs of mild erythema little bit more than what I noted last week or rather last visit. Nonetheless I think that my threshold for switching her antibiotics from Keflex to something else is very low at this point considering that she has had such severe infections in the past that seem to come almost out of nowhere. There is a little erythema and warmth noted of the lower portion of her leg compared to the upper which also makes me want to go ahead and address things more  rapidly at this point. Likely I would switch out the Keflex for something like Levaquin ideally. 7/16; patient with severe bilateral lymphedema. She has superficial wounds albeit almost circumferential now on the left lateral lower leg. This may be new from last time. Small area on the right anterior lower leg and then another area on the right medial lower leg and of pannus fold. She has been using various absorptive garments. She states she is using her compression pumps once a day occasionally twice. Culture from her last visit here was negative 01/29/2020 on evaluation today patient appears to be doing excellent at this point in regard to her legs with regard to infection I see no signs of active infection at this point. She still does have unfortunately areas of weeping this is minimal on the right now her left is actually significantly worse although I do not think it is as bad as last week with Dr. Dellia Nims saw her. She has been trying to pump and elevate her legs is much as possible. She has previously been on the Keflex and in the past for prevention that seems to do fairly well and likely can extend that today. 02/04/2020 on evaluation today patient appears to be doing better in regard to her legs bilaterally. Fortunately there is no signs of active infection at this time which is great news and overall  she has less weeping on the left compared to the right and there is several spots where she is pretty much sealed up with no draining regions. Overall very pleased in this regard. 02/19/2020 on evaluation today patient appears to be doing very well in regard to her wounds currently. Fortunately there is no evidence of active infection overall very pleased with where things stand. She is significantly improved in regard to her edema I am extremely pleased in this regard she tells me that the popping no longer hurts and in fact she actually looks forward to it. 03/04/2020 on evaluation today patient  appears to be doing excellent in regard to her lower extremities. Fortunately there is no signs of active infection at this time. No fevers, chills, nausea, vomiting, or diarrhea. 03/25/2020 on evaluation today patient appears to be doing a little bit more poorly in regard to her legs at this point. She tells me that she is still continue to have issues with drainage and this has been a little bit worse she was getting ready to start taking the Keflex again but wanted to see me first. Fortunately there is no signs of active infection at this time. No fevers, chills, nausea, vomiting, or diarrhea. 04/15/2020 upon evaluation today patient appears to be doing somewhat poorly in regard to her right leg. She tells me she has been having more pain she has been taking the Keflex that was previously prescribed unfortunately that just does not seem to help with this. She was hoping that the pain on her right was actually coming from the fact that she was having issues with her wrap having gotten caught in her recliner. With that being said she tells me that she knew something was not right. Currently her right leg is warm to touch along with being erythematous all the way up to around at least mid thigh as far as I can see. The left leg does not appear to be doing that badly though there is increased weeping around the ankle region. 05/06/2020 on evaluation today patient appears to be doing much better than last time I saw her. She did go to the hospital where she was admitted for 2 days and treated with antibiotic therapy. She was discharged with antibiotics as well and has done extremely well. I am extremely pleased with where things stand today. There is no signs of active infection at this time which is great news. 05/27/2020 upon evaluation today patient appears to be doing well with regard to her lower extremities bilaterally. She has just a very tiny area on the right leg which is opening on the left leg  she is significantly improved though she still has several areas that do appear to be open this is minimal compared to what is been in the past. In general I am extremely pleased with where things stand today. The patient does tell me she is not been using her pumps quite as much as she should be. I do believe that is 1 area she can definitely work on. She has had a lot going on including a Covid exposure and apparently also a outbreak of likely shingles. 06/24/2020 upon evaluation today patient appears to be doing well with regard to her legs in general although the left leg unfortunately is showing some signs of erythema she does have a little bit of increased weeping and to be honest I am concerned about infection here. I discussed that with her today and I think that we  may need to address this sooner rather than later she has been taking Keflex she is not really certain that is been making a big improvement however. No fevers, chills, nausea, vomiting, or diarrhea. [name], Elizabeth ([id number]) 06/30/2020 upon evaluation today patient's legs actually seem to be doing better in my opinion as compared to where they were last week. Fortunately there does not appear to be any signs of active infection. Her culture showed multiple organisms nothing predominate. With that being said the Levaquin seems to have done well I think she has improved since I last saw her as well. 07/14/2020 upon evaluation today patient appears to be doing actually better in regard to her lower extremities in my opinion. She has been tolerating the dressing changes without complication. Fortunately there is no signs of active infection at this time. 08/04/2020 upon evaluation today patient appears to be doing excellent in regard to her leg ulcers. Fortunately she has very little that is open at this point. This is great news. In fact I think that the [employer] medicated powder has been excellent for her. It seems to have done the  trick where we had tried several other things without as much success. Fortunately there is no evidence of active infection at this time. No fevers, chills, nausea, vomiting, or diarrhea. 09/01/2020 upon evaluation today patient appears to be doing a little bit more poorly than the last time I saw her. She tells me right now that she has been having a lot of drainage compared to where things were previous which has unfortunately led to more irritation as well. She is concerned that this is leading to infection. Again based on what I am seeing today as well I am also concerned of the same to be honest. 09/15/2020 upon evaluation today patient appears to be doing well with regard to her legs compared to what I saw previous. Fortunately there does not appear to be any evidence of active infection at this time which is great news. At least not as badly as what it was previous. With that being said I do believe that the patient does need to have an extension of the antibiotics. This I believe will actually help her more in the way of making sure this stays under control and does not worsen. That was the Bactrim DS. Readmission: 08/01/2021 upon evaluation today patient appears to be doing actually pretty well all things considered. Is actually been almost a year since have seen her last March. Fortunately I do not see any evidence of active infection locally nor systemically at this point. She does have a couple open areas on the left leg the right leg appears to be doing quite well. In general she has been in the hospital 3 times since I saw her a year ago all 4 cellulitis type issues except for the last 1 which was actually more related to congestive heart failure for that reason she is not using her lymphedema pumps at this point and that is probably the best idea. 08/08/2021 upon evaluation today patient appears to be doing well with regard to her legs all things considered her left leg may be a little bit  more swollen based on what I see today. Fortunately I do not see any signs of active infection locally nor systemically at this time which is great news. No fevers, chills, nausea, vomiting, or diarrhea. 08/15/2021 upon evaluation today patient actually appears to be doing excellent in regard to her wounds. She has  been tolerating the dressing changes without complication. Fortunately I see no evidence of infection currently which is great news. No fevers, chills, nausea, vomiting, or diarrhea. 08/29/2021 upon evaluation patient appears to be doing excellent she in fact is almost completely healed. I am actually very pleased with where we stand today and I think that she is making wonderful progress she just has a small area of weeping on the right lateral leg everything else is pretty much completely healed which is also. 09/05/2021 upon evaluation today patient appears to be doing well with regard to her legs. She does have a small area of weeping on the right leg and there is a small area on the left leg as well. It is potentially something that may need to be addressed in the future more specifically but right now I think that there may have just been a little drainage here on the left. With all that being said I think that this still does not appear to be infected and overall I feel like she is doing quite well. That something we always have to keep a very close eye on as she can change very rapidly from okay to not okay. 09/12/2021 upon evaluation today patient appears to be doing a little bit worse in regard to her leg and actually somewhat concerned about the possibility of infection here. I discussed that with the patient. For that reason I am going to go ahead and see about getting her set up for a repeat prescription for the Bactrim. She is in agreement with that plan. 3/14; patient presents for follow-up. She has been using silver alginate with dressing changes. She is still taking Bactrim  prescribed at last clinic visit. She has no issues or complaints today. She has lymphedema pumps however does not use these. 09/26/2020 upon evaluation today patient appears to actually be doing well in regard to her right leg I am pleased in that regard. Unfortunately she has new areas on her left leg that are open at this point that I do need to be addressed. I am also concerned about the warmth around one of the wounds on the more anterior side. I think this is something that we may need to culture today potentially requiring antibiotics going forward. 10/03/2021 upon evaluation today patient unfortunately is not doing nearly as well as she was even last week with her wounds. She is having some issues here with increased cellulitis of the left lower extremity unfortunately. I did prescribe Augmentin for her eczema prescribed last week unfortunately she never actually received this prescription. The pharmacy stated they never got it. We double checked with them today they still did not receive it. I am not sure what happened in that regard. With that being said the patient is in good spirits today she does not appear to be as sick as where she normally is when the cellulitis gets significantly worse as it has in the past. Nonetheless the leg is much more red not just around the ankle where I was seeing at last week on Tuesday but rather now this is going all the way up to almost her knee and is definitely hot to touch compared to the right leg. Nonetheless I feel like she does need to go to the ER for likely IV antibiotics. 10-10-2021 upon evaluation today patient appears to be doing well with regard to her wound. She has been tolerating the dressing changes that appears to be doing much better. After I saw  her last week she did go to the ER they admitted her to the hospital and gave her IV antibiotics she fortunately is doing significantly better. Overall I am extremely pleased with where things stand  today. 10-17-2021 upon evaluation today patient appears to be doing well with regard to her wound. In fact this is looking better and her leg is looking better but she still does have some areas here of erythema. We will continue to address this as soon as possible in my opinion. I do believe she may require some debridement and what appears to be a more defined wound on her left leg at this point. 10-24-2021 upon evaluation patient is definitely showing signs of improvement which is great news. I do not see any evidence of active infection locally or systemically which is great news as well. No fevers, chills, nausea, vomiting, or diarrhea. 10-31-2021. Upon evaluation today patient's leg actually feels better from an infection and wound care perspective. I am actually very pleased in this regard. Unfortunately the biggest issue that I see at this time is that the patient is having a significant issue with her breathing. I did put her on the pulse ox machine to check her oxygen saturation and it was pretty much at 100% which is good but she tells me that when she is walking Bosher, Nakai C. ([id number]) this is much more difficult for her. She also tells me that when she is trying to bend over to perform her dressing changes she is so short of breath due to the swelling and fluid in her abdominal area that she is just not been able to do this. She is tearful and very concerned today to be honest. I am truly sorry to see her this way I know she is really having a hard time at the moment. 11-14-2021 upon evaluation patient actually appears to be doing significantly better compared to when I last saw her. She was actually admitted to the hospital I believe this for 6 days total after I sent her on 10-31-2021. Subsequently they actually pulled off greater than 50 pounds of fluid she tells me she feels significantly better her legs are not as tight she actually has some play in the tissue and it is obvious that  she is doing much better just from a mobility standpoint as well as a breathing standpoint. Overall I am extremely happy for her and how she is doing the leg also looks much better on the left. 11-21-2021 upon evaluation today patient appears to be doing well although it does appear that she is going require some sharp debridement the wound is very dry this is the opposite of what its been she was having so much weeping that it was staying extremely wet. Nonetheless we do need to see about going ahead and debriding the wound today. 11-28-2021 upon evaluation today patient appears to be doing well with regard to her wound I definitely see signs of things continuing to improve which is great news. I do not see any evidence of active infection locally or systemically also great news. 12-05-2021 upon evaluation today patient appears to be doing well with regard to her wound. She has been tolerating the dressing changes. Fortunately there does not appear to be any signs of active infection locally or systemically at this time. No fevers, chills, nausea, vomiting, or diarrhea. 12-12-2021 upon evaluation patient appears to be doing well with regard to her wound this is actually measuring smaller and looking much  better. Fortunately I do not see any signs of active infection locally or systemically at this time which is excellent news. 6/13; small wound area in the folds of her lymphedema just above the left ankle. Fortunately the area looks quite good. She has been using silver alginate ABDs and an Ace wrap which she is able to change her self. She is not using her compression pumps out of fear that this could contribute to heart failure. 12-26-2021 upon evaluation patient's wounds are actually showing signs of excellent improvement. I am very pleased with where things stand. Overall I think that she has been making excellent progress and I think we are very close to complete resolution which is great  news. 01-11-2022 upon evaluation today patient appears to be doing well currently in regard to her legs in general although on the left leg she does have 1 area that still irritated and inflamed on the anterior portion of her leg. Fortunately I do not see any signs of systemic infection locally there might be some infection going on here however which is my biggest concern. Fortunately I do not see any evidence though of this spreading to any other location. 01-16-2022 upon evaluation today patient has actually showing signs of excellent improvement. Fortunately I do not see any evidence of infection locally or systemically which is great news and overall I am very pleased with where we stand at this point. 01-25-2022 upon evaluation today patient actually appears to be doing decently well in regard to her legs. She has been tolerating the dressing changes without complication. Fortunately there does not appear to be any evidence of active infection locally or systemically which is great news and overall I am extremely pleased with where we stand at this point. 02-13-2022 upon evaluation today patient appears to be doing well currently in regard to her wound which is actually showing signs of significant improvement. Fortunately I do not see any evidence of active infection locally or systemically at this time. No fevers, chills, nausea, vomiting, or diarrhea. 02-20-2022 upon evaluation today patient appears to be doing well currently in regard to her wound. She has been tolerating the dressing changes without complication. Fortunately there does not appear to be any signs of active infection locally or systemically at this time. No fevers, chills, nausea, vomiting, or diarrhea. 02-27-2022 upon evaluation today patient appears to be doing excellent in regard to her wounds on the leg. She has been tolerating the dressing changes without complication this is very close to complete resolution. Fortunately I do  not see any signs of infection locally or systemically at this time which is great news. No fevers, chills, nausea, vomiting, or diarrhea. 03-06-2022 upon evaluation today patient appears to be doing well currently in regard to her wound in general. She has been tolerating the dressing changes without complication. Fortunately I do not see any evidence of active infection locally or systemically at this time which is great news. I am concerned a little bit however about not really her wounds but the second toe right foot where there appears to be some cellulitis after she dropped a frying pan on this about 2 weeks ago. She tells me its been itching and burning and I do think that it is possible she may have an infection based on what we are seeing currently. Electronic Signature(s) Signed: 03/06/2022 10:35:41 AM By: Worthy Keeler PA-C Entered By: Worthy Keeler on 03/06/2022 10:35:41 Labrake, [name] ([id number]) -------------------------------------------------------------------------------- Physical Exam Details Patient  Name: HERLINDA, [name]. Date of Service: 03/06/2022 9:45 AM Medical Record Number: [id number] Patient Account Number: [id number] Date of Birth/Sex: 1955/12/22 (66 y.o. F) Treating RN: Cornell Barman Primary Care Provider: Ranae Plumber Other Clinician: Massie Kluver Referring Provider: Ranae Plumber Treating Provider/Extender: Skipper Cliche in Treatment: 31 Constitutional Obese and well-hydrated in no acute distress. Respiratory normal breathing without difficulty. Psychiatric this patient is able to make decisions and demonstrates good insight into disease process. Alert and Oriented x 3. pleasant and cooperative. Notes Upon inspection patient's wound on the left leg seem to be doing okay although she has some areas that she seems to be rubbing causing some issues here. Fortunately I do not see any evidence of active infection systemically which is great news.  Locally I am concerned about the second toe right foot where she dropped a frying pan on this 2 weeks ago. Electronic Signature(s) Signed: 03/06/2022 10:36:20 AM By: Worthy Keeler PA-C Entered By: Worthy Keeler on 03/06/2022 10:36:20 Rosengren, [name] ([id number]) -------------------------------------------------------------------------------- Physician Orders Details Patient Name: [name] Date of Service: 03/06/2022 9:45 AM Medical Record Number: [id number] Patient Account Number: [id number] Date of Birth/Sex: 04-14-1956 (66 y.o. 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 o Nurse Visit as needed Bathing/ Shower/ Hygiene o Clean wound with Normal Saline or wound cleanser. o Wash wounds with antibacterial soap and water. o May shower; gently cleanse wound with antibacterial soap, rinse and pat dry prior to dressing wounds o No tub bath. Edema Control - Lymphedema / Segmental Compressive Device / Other o Ace wraps o Elevate, Exercise Daily and Avoid Standing for Long Periods of Time. o Elevate leg(s) parallel to the floor when sitting. o DO YOUR BEST to sleep in the bed at night. DO NOT sleep in your recliner. Long hours of sitting in a recliner leads to swelling of the legs and/or potential wounds on your backside. o Other: - use lymphedema pumps Non-Wound Condition Bilateral Lower Extremities o Cleanse affected area with antibacterial soap and  water, o Apply appropriate compression. - ace wrap o Additional non-wound orders/instructions: - silver cell to open weeping areas with abd pad Medications-Please add to medication list. o P.O. Antibiotics - start doxycycline as directed Wound Treatment Wound #10 - Lower Leg Wound Laterality: Left, Medial, Anterior Cleanser: Soap and Water (Generic) Discharge Instructions: Gently cleanse wound with antibacterial soap, rinse and pat dry prior to dressing wounds Primary Dressing: SILVERCEL Antimicrobial Alginate Dressing, 1x12 (in/in) (Generic) Secondary Dressing: ABD Pad 5x9 (in/in) (Generic) Discharge Instructions: Cover with ABD pad Secondary Dressing: Conforming Guaze Roll-Medium (Generic) Discharge Instructions: Apply Conforming Stretch Guaze Bandage as directed Secured With: ACE WRAP - 84M ACE Elastic Bandage With VELCRO Brand Closure, 4 (in) (Generic) Patient Medications Allergies: ibuprofen, ACE Inhibitors, vancomycin Notifications Medication Indication Start End doxycycline hyclate 03/06/2022 [name], [name] ([id number]) Notifications Medication Indication Start End DOSE 1 - oral 100 mg capsule - 1 capsule oral taken 2 times per day for 14 days Electronic Signature(s) Signed: 03/06/2022 10:33:32 AM By: Worthy Keeler PA-C Entered  By: Worthy Keeler on 03/06/2022 10:33:31 Kondo, [name] ([id number]) -------------------------------------------------------------------------------- Problem List Details Patient Name: [name], [name]. Date of Service: 03/06/2022 9:45 AM Medical Record Number: [id number] Patient Account Number: [id number] Date of Birth/Sex: 1956-04-11 (66 y.o. F) Treating RN: Cornell Barman Primary Care Provider: Ranae Plumber Other Clinician: Massie Kluver Referring Provider: Ranae Plumber Treating Provider/Extender: Skipper Cliche in Treatment: 31 Active Problems ICD-10 Encounter Code Description Active Date MDM Diagnosis Q82.0 Hereditary  lymphedema 08/01/2021 No Yes L97.811 Non-pressure chronic ulcer of other part of right lower leg limited to 08/01/2021 No Yes breakdown of skin L97.821 Non-pressure chronic ulcer of other part of left lower leg limited to 08/01/2021 No Yes breakdown of skin Inactive Problems Resolved Problems Electronic Signature(s) Signed: 03/06/2022 9:52:25 AM By: Worthy Keeler PA-C Entered By: Worthy Keeler on 03/06/2022 09:52:25 Litle, [name] ([id number]) -------------------------------------------------------------------------------- Progress Note Details Patient Name: [name]. Date of Service: 03/06/2022 9:45 AM Medical Record Number: [id number] Patient Account Number: [id number] Date of Birth/Sex: 1956-01-24 (66 y.o. F) Treating RN: Cornell Barman Primary Care Provider: Ranae Plumber Other Clinician: Massie Kluver Referring Provider: Ranae Plumber Treating Provider/Extender: Skipper Cliche in Treatment: 31 Subjective Chief Complaint Information obtained from Patient Bilateral LE lymphedema with Left LE Ulcers History of Present Illness (HPI) The patient is a 66 year old female with history of hypertension and a long-standing history of bilateral lower extremity lymphedema (first presented on 4/2) . She has had open ulcers in the past which have always responded to compression therapy. She had briefly been to a lymphedema clinic in the past which helped her at the time. this time around she stopped treatment of her lymphedema pumps approximately 2 weeks ago because of some pain in the knees and then noticed the right leg getting worse. She was seen by her PCP who put her on clindamycin 4 times a day 2 days ago. The patient has seen [facility] and Dr. Delana Meyer had seen her last year where a vascular study including venous and arterial duplex studies were within normal limits. he had recommended compression stockings and lymphedema pumps and the patient has been using this in about 2  weeks ago. She is known to be diabetic but in the past few time she's gone to her primary care doctor her hemoglobin A1c has been normal. 02/11/2015 - after her last visit she took my advice and went to the ER regarding the progressive cellulitis of her right lower extremity and she was admitted between July 17 and 22nd. She received IV antibiotics and then was sent home on a course of steroid-induced and oral antibiotics. She has improved much since then. 02/17/2015 -- she has been doing fine and the weeping of her legs has remarkably gone down. She has no fresh issues. READMISSION 01/15/18 This patient was given this clinic before most recently in 2016 seen by Dr. Con Memos. She has massive bilateral lymphedema and over the last 2 months this had weeping edema out of the left leg. She has compression pumps but her compliance with these has been minimal. She has [facility] they've been using TCA/ABDs/kerlix under an Ace wrap.she has had recent problems with cellulitis. She was apparently seen in the ER and 12/23/17 and given clindamycin. She was then followed by her primary doctor and given doxycycline and Keflex. The pain seems to have settled down. In April 2018 the patient had arterial studies done at [facility]. This showed triphasic waveforms throughout the right  leg and mostly triphasic waveforms on the left except for monophasic at the posterior tibial artery distally. She was not felt to have evidence of right lower extremity arterial stenosis or significant problems on the left side. 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 She also had a right lower extremity venous Doppler in January 2018 this was limited by the patient's body habitus and lymphedema. Most of the proximal veins were not visualized The patient presents with an area of denuded skin on the anterior medial part of the left calf. There is weeping edema fluid here. 01/22/18; the  patient has somewhat better edema control using her compression pumps twice a day and as a result she has much better epithelialization on the left anterior calf area. Only a small open area remains. 01/29/18; the patient has been compliant with her compression pumps. Both the areas on her calf that healed. The remaining area on the left anterior leg is fully epithelialized Readmission: 02/20/2019 upon evaluation today patient presents for reevaluation due to issues that she is having with the bilateral lower extremities. She actually has wounds open on both legs. On the right she has an area in the crease of her leg on the right around the knee region which is actually draining quite a bit and actually has some fungal type appearance to it. She has been on nystatin powder that seems to have helped to some degree. In regard to the left lower extremity this is actually in the lower portion of her leg closer to the ankle and again is continuing to drain as well unfortunately. There does not appear to be any signs of active infection at this time which is good news. No fevers, chills, nausea, vomiting, or diarrhea. She tells me that since she was seen last year she is actually been doing quite well for the most part with regard to her lower extremities. Unfortunately she now is experiencing a little bit more drainage at this time. She is concerned about getting this under control so that it does not get significantly worse. 02/27/2019 on evaluation today patient appears to be doing somewhat better in regard to her bilateral lower extremity wounds. She has been tolerating the dressing changes without complication. Fortunately there is no signs of active infection at this point. No fevers, chills, nausea, vomiting, or diarrhea. She did get her dressing supplies which is excellent news she was extremely excited to get these. She also got paperwork from [employer] for their financial assistance program where they  may be able to help her out in the future if needed with supplies at discounted prices. 03/06/2019 on evaluation today patient appears to be doing a little worse with regard to both areas of weeping on her bilateral lower extremities. This is around the right medial knee and just above the left ankle. With that being said she is unfortunately not doing as well as I would like to see. I feel like she may need to potentially go see someone at the lymphedema clinic as the wraps that she needs or even beyond what we can do here at the wound care center. She really does not have wounds she just has open areas of weeping that are causing some difficulty for her. Subsequently because of this and the moisture I am concerned about the potential for infection I am going to likely give her a prophylactic antibiotic today, Keflex, just to be on the safe side. Nonetheless again there is no obvious  signs of active infection at this time. IVA, MONTELONGO ([id number]) 03/13/2019 on evaluation today patient appears to be doing well with regard to her bilateral lower extremities where she has been weeping compared to even last week's evaluation. I see some areas of new skin growth which is excellent and overall I am very pleased with how things seem to be progressing. No fevers, chills, nausea, vomiting, or diarrhea. 03/20/2019 on evaluation today patient unfortunately is continuing to have issues with significant edema of the left lower extremity. Her right side seems to be doing much better. Unfortunately her left side is showing increased weeping of the lower portion of her leg. This is quite unfortunate obviously we were hoping to get her into the lymphedema clinic they really do not seem to when I see her how if she is draining. Despite the fact this is really not wound related but more lymphedema weeping related. Nonetheless I do not know that this can be helpful for her to even go for that appointment since again  I am not sure there is much that they would actually do at this point. We may need to try a 4 layer compression wrap as best we can on her leg. She is on the Augmentin currently although I am still concerned about whether or not there could be potentially something going on infection wise I would obtain a culture though I understand is not the best being that is a surface culture I just 1 to make sure I do not seem to be missing anything. 03/27/2019 on evaluation today patient appears to be doing much better in regard to the left lower extremity compared to last week. Last week she had tremendous weeping which I think was subsequent to infection now she seems to be doing much better and very pleased. This is not completely healed but there is a lot of new skin growth and it has dried out quite a bit. Overall I think that we are doing well with how things are moving along at this time. No fevers, chills, nausea, vomiting, or diarrhea. 04/03/2019 evaluation today patient appears to be doing a little worse this week compared to last time I saw her. I think this may be due to the fact that she is having issues with not being able to sleep in her bed at least not until last night. She is therefore been in a lift chair and subsequently has also had issues with not been able to use her pumps since she could not get in bed. With that being said the patient overall seems to be doing okay I do think I may want extend the antibiotic for a little bit longer at least until we can see if her edema and her weeping gets better and if it is then obviously I can always discontinue the antibiotics as of next week however I want her to continue to have it over the next week. 04/10/2019 on evaluation today patient unfortunately is still doing poorly with regard to her left lower extremity. Her right is all things considering doing fairly well. On the left however she continues to have spreading of the area of infection and  weeping which appears to be even a larger surface area than noted last week. She did have a positive culture for Pseudomonas in particular which seems to have been of concern she still has green/yellow discharge consistent with Pseudomonas and subsequently a tremendous amount of it. This has me obviously still concerned about  the infection not really clearing up despite the fact that on culture it appears the Cipro should have been a good option for treating this. I think she may at this point need IV antibiotics since things are not doing better I do not want to get worse and cause sepsis. She is in agreement with the plan and believes as well that she likely does need to go to the hospital for IV vancomycin. Or something of the like depending on what the recommendation is from the ER. 04/17/2019 on evaluation today patient appears to be doing excellent in regard to her lower extremity on the left. She was in the hospital for several days from when I sent her last we saw her until just this past Tuesday. Fortunately her drainage is significantly improved and in fact is mostly clear. There is just a couple small areas that may still drain a little bit she states that the [facility] they prescribed for her at discharge she went picked up from pharmacy and got home but has not been able to find it since. She is looked everywhere. She is wondering if I will replace that for her today I will be more than happy to do that. 05/01/2019 on evaluation today patient actually appears to be doing quite well with regard to her lower extremities. She occasionally is having areas that will leak and then heal up mainly when a piece of the fibrotic skin pops off but fortunately she is not having any signs of active infection at this time. Overall she also really does not have any obvious weeping at this time. I do believe however she really needs some compression wraps and I think this may be a good time to get her back  to the lymphedema clinic. 05/11/2019 on evaluation today patient actually appears to be doing quite well with regard to her bilateral lower extremities. She occasionally will have a small area that we per another but in general seems to be completely healed which is great news. Overall very pleased with how everything seems to be progressing. She does have her appointment with lymphedema clinic on November 18. 05/25/2019 on evaluation today patient appears to be doing well with regard to her left lower extremity. I am very pleased in this regard. In regard to her right leg this actually did start draining more I think it is mainly due to the fact that her leg is more swollen. I am not seeing any obvious signs of infection at this time although that is definitely something were obviously acutely aware of simply due to the fact that she had an issue not too far back with exactly this issue. Nonetheless I do feel like that lymphedema clinic would still be beneficial for her. I explained obviously if they are not able to do anything treatment wise on the right leg we could at least have them treat her left leg and then proceed from there. The patient is really in agreement with that plan. If they are able to do both as the drainage slows down that I would be happy to let them handle both. 06/01/2019 on evaluation today patient unfortunately appears to be doing worse with regard to her right lower extremity. The left lower extremity is still maintaining at this point. Unfortunately she has been having significantly increased pain over the past several days and has been experiencing as well increased swelling of the right lower extremity. I really do not know that I am seeing anything that  appears to be obvious for infection at this point to be peripherally honest. With that being said the patient does seem to be having much more swelling that she is even experienced in the past and coupled with increased  pain in her hip as well I am concerned that again she could potentially have a DVT although I am not 100% sure of this. I think it something that may need to be checked out. We discussed the possibility of sending her for a DVT study through the hospital but unfortunately transportation is an issue if she does have a DVT I do not want her to wait days to be able to get in for that test however if she has this scheduled as an outpatient that is as fast that she will be able to get the test scheduled for transportation purposes. That will also fall on Thanksgiving so subsequently she did actually be looking at either Friday or even next week before we would know anything back from this. That is much too long in my opinion. Subsequent to the amount of discomfort she is experiencing the patient is actually okay with going to the ER for evaluation today. 06/12/2019 on evaluation today patient actually appears to be doing significantly better compared to last time I saw her. Following when I last saw her she was actually in the hospital from that Monday until the following Sunday almost 1 full week. She actually was placed on Keflex in the hospital following the time for her to be discharged and Dr. Steva Ready has recommended 2 times a day dosing of the Keflex for the next year in order to help with more prophylactic/preventative measures with regard to her developing cellulitis. Overall I think this sounds like an excellent plan. The patient unfortunately is good to have trouble being treated at lymphedema clinic due to the fact that she really cannot get up on the bed that they have there. They also state that they cannot manage her as long as she has anything draining at this point. Obviously that is somewhat unfortunate as she does need help with edema control but nonetheless we will have to do what we can for her outside of it sounds like the lymphedema clinic scenario at this point. 06/19/2019 on  evaluation today patient appears to be doing fairly well with regard to her bilateral lower extremities. She is not nearly as swollen [name], [name] ([id number]) and shows no signs of infection at this point. There is no evidence of cellulitis whatsoever. She also has no open wounds or draining at this point which is also good news. No fever chills noted. She seems to be in very good spirits and in fact appears to be doing quite well. READMISSION 11/27/2019 This is a 66 year old woman that we have had in this clinic several times before including 2015, 16 and 19 and then most recently from 03/20/2019 through 06/19/2019 with bilateral lower extremity lymphedema. She has had previous arterial and reflux studies done years ago which were not all that remarkable. 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 She does have a positive family history and she had large legs starting may be in her 89s. She was recently in hospital from 10/20/2019 through 10/28/2019 with right leg cellulitis. She was given Ancef and clindamycin and then Zosyn when a culture showed Pseudomonas. At that time there was purulent drainage. She was followed by infectious disease Dr Steva Ready. The patient is now  back at home. She has noted increased swelling in the right and no drainage in her right leg mostly on the posterior medial aspect in the calf area. She has not had pain or fever. She has literally been improved lysing above dressings because her at the area of this is far too large for standard compression. She has been wrapping the areas with sheets to resorptive pads. She is found these helped somewhat. She does have an appointment with the lymphedema clinic in [place] in late June. Past medical history includes bilateral lymphedema, hypertension, obstructive sleep apnea with CPAP. Recent hospitalization with apparently Pseudomonas cellulitis of the right lower  leg 12/15/2019 upon evaluation today patient appears to be doing a little bit worse in regard to her right lower extremity. Unfortunately she is having more weeping down in the lower portion of her leg. Fortunately there is no signs of active infection at this time. No fever chills noted. The patient states she is not having increased pain except for when she attempted to use the lymphedema pumps unfortunately she states that she did have pain when she did this. Otherwise we been using absorptive dressings of one type or another she is using diapers at home and then subsequently Ace wraps. In regard to the barrier cream we have discussed the possibility of derma cloud which she would like to try I do not have a problem with that. 12/22/2019 upon evaluation today patient actually appears to be doing better in regard to her leg ulcers at this point. Fortunately there does not appear to be any signs of active infection which is great news and I am extremely pleased with where things are progressing at this time. There is no sign of active infection currently. The patient is very pleased to see things doing so well. 12/29/2019 upon evaluation today patient appears to be doing a little bit better in regard to her weeping in general over her lower extremities. She does have some signs of mild erythema little bit more than what I noted last week or rather last visit. Nonetheless I think that my threshold for switching her antibiotics from Keflex to something else is very low at this point considering that she has had such severe infections in the past that seem to come almost out of nowhere. There is a little erythema and warmth noted of the lower portion of her leg compared to the upper which also makes me want to go ahead and address things more rapidly at this point. Likely I would switch out the Keflex for something like Levaquin ideally. 7/16; patient with severe bilateral lymphedema. She has superficial  wounds albeit almost circumferential now on the left lateral lower leg. This may be new from last time. Small area on the right anterior lower leg and then another area on the right medial lower leg and of pannus fold. She has been using various absorptive garments. She states she is using her compression pumps once a day occasionally twice. Culture from her last visit here was negative 01/29/2020 on evaluation today patient appears to be doing excellent at this point in regard to her legs with regard to infection I see no signs of active infection at this point. She still does have unfortunately areas of weeping this is minimal on the right now her left is actually significantly worse although I do not think it is as bad as last week with Dr. Dellia Nims saw her. She has been trying to pump and elevate her  legs is much as possible. She has previously been on the Keflex and in the past for prevention that seems to do fairly well and likely can extend that today. 02/04/2020 on evaluation today patient appears to be doing better in regard to her legs bilaterally. Fortunately there is no signs of active infection at this time which is great news and overall she has less weeping on the left compared to the right and there is several spots where she is pretty much sealed up with no draining regions. Overall very pleased in this regard. 02/19/2020 on evaluation today patient appears to be doing very well in regard to her wounds currently. Fortunately there is no evidence of active infection overall very pleased with where things stand. She is significantly improved in regard to her edema I am extremely pleased in this regard she tells me that the popping no longer hurts and in fact she actually looks forward to it. 03/04/2020 on evaluation today patient appears to be doing excellent in regard to her lower extremities. Fortunately there is no signs of active infection at this time. No fevers, chills, nausea,  vomiting, or diarrhea. 03/25/2020 on evaluation today patient appears to be doing a little bit more poorly in regard to her legs at this point. She tells me that she is still continue to have issues with drainage and this has been a little bit worse she was getting ready to start taking the Keflex again but wanted to see me first. Fortunately there is no signs of active infection at this time. No fevers, chills, nausea, vomiting, or diarrhea. 04/15/2020 upon evaluation today patient appears to be doing somewhat poorly in regard to her right leg. She tells me she has been having more pain she has been taking the Keflex that was previously prescribed unfortunately that just does not seem to help with this. She was hoping that the pain on her right was actually coming from the fact that she was having issues with her wrap having gotten caught in her recliner. With that being said she tells me that she knew something was not right. Currently her right leg is warm to touch along with being erythematous all the way up to around at least mid thigh as far as I can see. The left leg does not appear to be doing that badly though there is increased weeping around the ankle region. 05/06/2020 on evaluation today patient appears to be doing much better than last time I saw her. She did go to the hospital where she was admitted for 2 days and treated with antibiotic therapy. She was discharged with antibiotics as well and has done extremely well. I am extremely pleased with where things stand today. There is no signs of active infection at this time which is great news. 05/27/2020 upon evaluation today patient appears to be doing well with regard to her lower extremities bilaterally. She has just a very tiny area on the right leg which is opening on the left leg she is significantly improved though she still has several areas that do appear to be open this is minimal compared to what is been in the past. In general I  am extremely pleased with where things stand today. The patient does tell me she is not been using her pumps quite as much as she should be. I do believe that is 1 area she can definitely work on. She has had a lot going on including a Covid exposure and apparently also  a outbreak of likely shingles. [name], Elizabeth ([id number]) 06/24/2020 upon evaluation today patient appears to be doing well with regard to her legs in general although the left leg unfortunately is showing some signs of erythema she does have a little bit of increased weeping and to be honest I am concerned about infection here. I discussed that with her today and I think that we may need to address this sooner rather than later she has been taking Keflex she is not really certain that is been making a big improvement however. No fevers, chills, nausea, vomiting, or diarrhea. 06/30/2020 upon evaluation today patient's legs actually seem to be doing better in my opinion as compared to where they were last week. Fortunately there does not appear to be any signs of active infection. Her culture showed multiple organisms nothing predominate. With that being said the Levaquin seems to have done well I think she has improved since I last saw her as well. 07/14/2020 upon evaluation today patient appears to be doing actually better in regard to her lower extremities in my opinion. She has been tolerating the dressing changes without complication. Fortunately there is no signs of active infection at this time. 08/04/2020 upon evaluation today patient appears to be doing excellent in regard to her leg ulcers. Fortunately she has very little that is open at this point. This is great news. In fact I think that the Goldbond medicated powder has been excellent for her. It seems to have done the trick where we had tried several other things without as much success. Fortunately there is no evidence of active infection at this time. No fevers, chills,  nausea, vomiting, or diarrhea. 09/01/2020 upon evaluation today patient appears to be doing a little bit more poorly than the last time I saw her. She tells me right now that she has been having a lot of drainage compared to where things were previous which has unfortunately led to more irritation as well. She is concerned that this is leading to infection. Again based on what I am seeing today as well I am also concerned of the same to be honest. 09/15/2020 upon evaluation today patient appears to be doing well with regard to her legs compared to what I saw previous. Fortunately there does not appear to be any evidence of active infection at this time which is great news. At least not as badly as what it was previous. With that being said I do believe that the patient does need to have an extension of the antibiotics. This I believe will actually help her more in the way of making sure this stays under control and does not worsen. That was the Bactrim DS. Readmission: 08/01/2021 upon evaluation today patient appears to be doing actually pretty well all things considered. Is actually been almost a year since have seen her last March. Fortunately I do not see any evidence of active infection locally nor systemically at this point. She does have a couple open areas on the left leg the right leg appears to be doing quite well. In general she has been in the hospital 3 times since I saw her a year ago all 4 cellulitis type issues except for the last 1 which was actually more related to congestive heart failure for that reason she is not using her lymphedema pumps at this point and that is probably the best idea. 08/08/2021 upon evaluation today patient appears to be doing well with regard to her legs all things  considered her left leg may be a little bit more swollen based on what I see today. Fortunately I do not see any signs of active infection locally nor systemically at this time which is great news. No  fevers, chills, nausea, vomiting, or diarrhea. 08/15/2021 upon evaluation today patient actually appears to be doing excellent in regard to her wounds. She has been tolerating the dressing changes without complication. Fortunately I see no evidence of infection currently which is great news. No fevers, chills, nausea, vomiting, or diarrhea. 08/29/2021 upon evaluation patient appears to be doing excellent she in fact is almost completely healed. I am actually very pleased with where we stand today and I think that she is making wonderful progress she just has a small area of weeping on the right lateral leg everything else is pretty much completely healed which is also. 09/05/2021 upon evaluation today patient appears to be doing well with regard to her legs. She does have a small area of weeping on the right leg and there is a small area on the left leg as well. It is potentially something that may need to be addressed in the future more specifically but right now I think that there may have just been a little drainage here on the left. With all that being said I think that this still does not appear to be infected and overall I feel like she is doing quite well. That something we always have to keep a very close eye on as she can change very rapidly from okay to not okay. 09/12/2021 upon evaluation today patient appears to be doing a little bit worse in regard to her leg and actually somewhat concerned about the possibility of infection here. I discussed that with the patient. For that reason I am going to go ahead and see about getting her set up for a repeat prescription for the Bactrim. She is in agreement with that plan. 3/14; patient presents for follow-up. She has been using silver alginate with dressing changes. She is still taking Bactrim prescribed at last clinic visit. She has no issues or complaints today. She has lymphedema pumps however does not use these. 09/26/2020 upon evaluation today  patient appears to actually be doing well in regard to her right leg I am pleased in that regard. Unfortunately she has new areas on her left leg that are open at this point that I do need to be addressed. I am also concerned about the warmth around one of the wounds on the more anterior side. I think this is something that we may need to culture today potentially requiring antibiotics going forward. 10/03/2021 upon evaluation today patient unfortunately is not doing nearly as well as she was even last week with her wounds. She is having some issues here with increased cellulitis of the left lower extremity unfortunately. I did prescribe Augmentin for her eczema prescribed last week unfortunately she never actually received this prescription. The pharmacy stated they never got it. We double checked with them today they still did not receive it. I am not sure what happened in that regard. With that being said the patient is in good spirits today she does not appear to be as sick as where she normally is when the cellulitis gets significantly worse as it has in the past. Nonetheless the leg is much more red not just around the ankle where I was seeing at last week on Tuesday but rather now this is going all the way up  to almost her knee and is definitely hot to touch compared to the right leg. Nonetheless I feel like she does need to go to the ER for likely IV antibiotics. 10-10-2021 upon evaluation today patient appears to be doing well with regard to her wound. She has been tolerating the dressing changes that appears to be doing much better. After I saw her last week she did go to the ER they admitted her to the hospital and gave her IV antibiotics she fortunately is doing significantly better. Overall I am extremely pleased with where things stand today. 10-17-2021 upon evaluation today patient appears to be doing well with regard to her wound. In fact this is looking better and her leg is looking better  but she still does have some areas here of erythema. We will continue to address this as soon as possible in my opinion. I do believe she may require some debridement and what appears to be a more defined wound on her left leg at this point. 10-24-2021 upon evaluation patient is definitely showing signs of improvement which is great news. I do not see any evidence of active infection [place], [place]. ([id number]) locally or systemically which is great news as well. No fevers, chills, nausea, vomiting, or diarrhea. 10-31-2021. Upon evaluation today patient's leg actually feels better from an infection and wound care perspective. I am actually very pleased in this regard. Unfortunately the biggest issue that I see at this time is that the patient is having a significant issue with her breathing. I did put her on the pulse ox machine to check her oxygen saturation and it was pretty much at 100% which is good but she tells me that when she is walking this is much more difficult for her. She also tells me that when she is trying to bend over to perform her dressing changes she is so short of breath due to the swelling and fluid in her abdominal area that she is just not been able to do this. She is tearful and very concerned today to be honest. I am truly sorry to see her this way I know she is really having a hard time at the moment. 11-14-2021 upon evaluation patient actually appears to be doing significantly better compared to when I last saw her. She was actually admitted to the hospital I believe this for 6 days total after I sent her on 10-31-2021. Subsequently they actually pulled off greater than 50 pounds of fluid she tells me she feels significantly better her legs are not as tight she actually has some play in the tissue and it is obvious that she is doing much better just from a mobility standpoint as well as a breathing standpoint. Overall I am extremely happy for her and how she is doing the leg  also looks much better on the left. 11-21-2021 upon evaluation today patient appears to be doing well although it does appear that she is going require some sharp debridement the wound is very dry this is the opposite of what its been she was having so much weeping that it was staying extremely wet. Nonetheless we do need to see about going ahead and debriding the wound today. 11-28-2021 upon evaluation today patient appears to be doing well with regard to her wound I definitely see signs of things continuing to improve which is great news. I do not see any evidence of active infection locally or systemically also great news. 12-05-2021 upon evaluation today patient appears  to be doing well with regard to her wound. She has been tolerating the dressing changes. Fortunately there does not appear to be any signs of active infection locally or systemically at this time. No fevers, chills, nausea, vomiting, or diarrhea. 12-12-2021 upon evaluation patient appears to be doing well with regard to her wound this is actually measuring smaller and looking much better. Fortunately I do not see any signs of active infection locally or systemically at this time which is excellent news. 6/13; small wound area in the folds of her lymphedema just above the left ankle. Fortunately the area looks quite good. She has been using silver alginate ABDs and an Ace wrap which she is able to change her self. She is not using her compression pumps out of fear that this could contribute to heart failure. 12-26-2021 upon evaluation patient's wounds are actually showing signs of excellent improvement. I am very pleased with where things stand. Overall I think that she has been making excellent progress and I think we are very close to complete resolution which is great news. 01-11-2022 upon evaluation today patient appears to be doing well currently in regard to her legs in general although on the left leg she does have 1 area that  still irritated and inflamed on the anterior portion of her leg. Fortunately I do not see any signs of systemic infection locally there might be some infection going on here however which is my biggest concern. Fortunately I do not see any evidence though of this spreading to any other location. 01-16-2022 upon evaluation today patient has actually showing signs of excellent improvement. Fortunately I do not see any evidence of infection locally or systemically which is great news and overall I am very pleased with where we stand at this point. 01-25-2022 upon evaluation today patient actually appears to be doing decently well in regard to her legs. She has been tolerating the dressing changes without complication. Fortunately there does not appear to be any evidence of active infection locally or systemically which is great news and overall I am extremely pleased with where we stand at this point. 02-13-2022 upon evaluation today patient appears to be doing well currently in regard to her wound which is actually showing signs of significant improvement. Fortunately I do not see any evidence of active infection locally or systemically at this time. No fevers, chills, nausea, vomiting, or diarrhea. 02-20-2022 upon evaluation today patient appears to be doing well currently in regard to her wound. She has been tolerating the dressing changes without complication. Fortunately there does not appear to be any signs of active infection locally or systemically at this time. No fevers, chills, nausea, vomiting, or diarrhea. 02-27-2022 upon evaluation today patient appears to be doing excellent in regard to her wounds on the leg. She has been tolerating the dressing changes without complication this is very close to complete resolution. Fortunately I do not see any signs of infection locally or systemically at this time which is great news. No fevers, chills, nausea, vomiting, or diarrhea. 03-06-2022 upon  evaluation today patient appears to be doing well currently in regard to her wound in general. She has been tolerating the dressing changes without complication. Fortunately I do not see any evidence of active infection locally or systemically at this time which is great news. I am concerned a little bit however about not really her wounds but the second toe right foot where there appears to be some cellulitis after she dropped a frying  pan on this about 2 weeks ago. She tells me its been itching and burning and I do think that it is possible she may have an infection based on what we are seeing currently. Objective Constitutional Tupper, Oleta C. ([id number]) Obese and well-hydrated in no acute distress. Vitals Time Taken: 9:52 AM, Height: 63 in, Weight: 372 lbs, BMI: 65.9, Temperature: 98.3 F, Pulse: 70 bpm, Respiratory Rate: 18 breaths/min, Blood Pressure: 111/71 mmHg. Respiratory normal breathing without difficulty. Psychiatric this patient is able to make decisions and demonstrates good insight into disease process. Alert and Oriented x 3. pleasant and cooperative. General Notes: Upon inspection patient's wound on the left leg seem to be doing okay although she has some areas that she seems to be rubbing causing some issues here. Fortunately I do not see any evidence of active infection systemically which is great news. Locally I am concerned about the second toe right foot where she dropped a frying pan on this 2 weeks ago. Integumentary (Hair, Skin) Wound #10 status is Open. Original cause of wound was Gradually Appeared. The date acquired was: 10/17/2021. The wound has been in treatment 20 weeks. The wound is located on the Left,Medial,Anterior Lower Leg. The wound measures 0.6cm length x 0.7cm width x 0.1cm depth; 0.33cm^2 area and 0.033cm^3 volume. There is Fat Layer (Subcutaneous Tissue) exposed. There is a medium amount of serosanguineous drainage noted. There is medium (34-66%)  red granulation within the wound bed. There is a small (1-33%) amount of necrotic tissue within the wound bed. 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 Nurse Visit as needed Bathing/ Shower/ Hygiene: Clean wound with Normal Saline or wound cleanser. Wash wounds with antibacterial soap and water. May shower; gently cleanse wound with antibacterial soap, rinse and pat dry prior to dressing wounds No tub bath. Edema Control - Lymphedema / Segmental Compressive Device / Other: Ace wraps Elevate, Exercise Daily and Avoid Standing for Long Periods of Time. Elevate leg(s) parallel to the floor when sitting. DO YOUR BEST to sleep in the bed at night. DO NOT sleep in your recliner. Long hours of sitting in a recliner leads to swelling of the legs and/or potential wounds on your backside. Other: - use lymphedema pumps Non-Wound Condition: Cleanse affected area with antibacterial soap and water, Apply appropriate compression. - ace wrap Additional non-wound orders/instructions: - silver cell to open weeping areas with abd pad Medications-Please add to medication list.: P.O. Antibiotics - start doxycycline as directed The following medication(s) was prescribed: doxycycline hyclate oral 100 mg capsule 1 1 capsule oral taken 2 times per day for 14 days starting 03/06/2022 WOUND #10: - Lower Leg Wound Laterality: Left, Medial, Anterior Cleanser: Soap and Water (Generic) Discharge Instructions: Gently cleanse wound with antibacterial soap, rinse and pat dry prior to dressing wounds Primary Dressing: SILVERCEL Antimicrobial Alginate Dressing, 1x12 (in/in) (Generic) Secondary Dressing: ABD Pad 5x9 (in/in) (Generic) Discharge Instructions: Cover with  ABD pad Secondary Dressing: Conforming Guaze Roll-Medium (Generic) Discharge Instructions: Apply Conforming Stretch Guaze Bandage as directed Secured With: ACE WRAP - 18M ACE Elastic Bandage With VELCRO Brand Closure, 4 (in) (Generic) Maberry, Amarie C. ([id number]) 1. Based on what I am seeing at this point I would recommend that we go ahead and initiate treatment with a continuation of the use of  silver cell to the open areas. 2. Also can recommend we continue with the roll gauze ABD pads and Ace wrap to secure in place. 3. I am also going to suggest that we have the patient continue with the doxycycline for the next 2 weeks I did send this into her pharmacy. We will see patient back for reevaluation in 1 week here in the clinic. If anything worsens or changes patient will contact our office for additional recommendations. Electronic Signature(s) Signed: 03/06/2022 10:36:59 AM By: Worthy Keeler PA-C Entered By: Worthy Keeler on 03/06/2022 10:36:58 Lippman, [name] ([id number]) -------------------------------------------------------------------------------- SuperBill Details Patient Name: [name] Date of Service: 03/06/2022 Medical Record Number: [id number] Patient Account Number: [id number] Date of Birth/Sex: 12/08/1955 (66 y.o. F) Treating RN: Cornell Barman Primary Care Provider: Ranae Plumber Other Clinician: Massie Kluver Referring Provider: Ranae Plumber Treating Provider/Extender: Skipper Cliche in Treatment: 31 Diagnosis Coding ICD-10 Codes Code Description Q82.0 Hereditary lymphedema L97.811 Non-pressure chronic ulcer of other part of right lower leg limited to breakdown of skin L97.821 Non-pressure chronic ulcer of other part of left lower leg limited to breakdown of skin Facility Procedures CPT4 Code: 49753005 Description: 99213 - WOUND CARE VISIT-LEV 3 EST PT Modifier: Quantity: 1 Physician Procedures CPT4 Code: 1102111 Description: 73567 - WC PHYS  LEVEL 3 - EST PT Modifier: Quantity: 1 CPT4 Code: Description: ICD-10 Diagnosis Description Q82.0 Hereditary lymphedema L97.821 Non-pressure chronic ulcer of other part of left lower leg limited to brea L97.811 Non-pressure chronic ulcer of other part of right lower leg limited to bre Modifier: kdown of skin akdown of skin Quantity: Electronic Signature(s) Signed: 03/06/2022 10:37:10 AM By: Worthy Keeler PA-C Entered By: Worthy Keeler on 03/06/2022 10:37:10

## 2022-03-06 NOTE — Progress Notes (Signed)
Patient ID: [name], female    DOB: 1955/09/14, 65 y.o.   MRN: [id number]  HPI  Ms Blattner is a 66 y/o female with a history of asthma, HTN, arthritis, lymphedema, sleep apnea, spinal stenosis, obesity, previous tobacco use and chronic heart failure.   Echo report from 11/01/21 reviewed and showed an EF of 60-65%.   Admitted 10/31/21 due to worsening shortness of breath, edema and orthopnea. Initially given IV lasix with transition to oral diuretics with addition of twice weekly metolazone. Cardiology consult obtained. Discharged after 6 days.   She presents today for a follow-up visit with a chief complaint of moderate fatigue with minimal exertion. Describes this as chronic in nature although fluctuating. She has associated shortness of breath, wheezing, pedal edema, palpitations, anxiety, difficulty sleeping and gradual weight gain along with this. She denies any dizziness, cough, chest pain or abdominal distention.   She says that earlier today she was diagnosed with cellulitis of her right 2nd toe and will be starting an antibiotic later today.   She says that she hasn't been following her diet as closely as before and has been eating cornbread and "lots of carbs".   She says that sometimes her furosemide works better than at other times. Tried torsemide once before with subsequent weight gain.   Past Medical History:  Diagnosis Date   Arthritis    Asthma    CHF (congestive heart failure) ([place])    Enlarged heart    Hypertension    Lymphedema    Sleep apnea    Spinal stenosis    Past Surgical History:  Procedure Laterality Date   COLONOSCOPY WITH PROPOFOL N/A 03/12/2018   Procedure: COLONOSCOPY WITH PROPOFOL;  Surgeon: Manya Silvas, MD;  Location[facility] ENDOSCOPY;  Service: Endoscopy;  Laterality: N/A;   NO PAST SURGERIES     Family History  Problem Relation Age of Onset   Thyroid disease Other    Breast cancer Maternal Aunt    Social History   Tobacco Use    Smoking status: Former    Packs/day: 0.25    Types: Cigarettes    Start date: 07/09/1976    Quit date: 07/09/1990    Years since quitting: 31.6   Smokeless tobacco: Current    Types: Snuff  Substance Use Topics   Alcohol use: No   Allergies  Allergen Reactions   Ace Inhibitors Hives and Swelling   Ibuprofen Hives and Swelling   Vancomycin Itching and Nausea And Vomiting   Prior to Admission medications   Medication Sig Start Date End Date Taking? Authorizing Provider  acetaminophen (TYLENOL) 500 MG tablet Take 500-1,000 mg by mouth every 6 (six) hours as needed for mild pain or fever.   Yes [provider]  atenolol (TENORMIN) 25 MG tablet Take 1 tablet (25 mg total) by mouth daily. 11/06/21  Yes Enzo Bi, MD  cetirizine (ZYRTEC) 10 MG tablet Take 1 tablet (10 mg total) by mouth daily. 12/25/20  Yes Lavina Hamman, MD  metolazone (ZAROXOLYN) 2.5 MG tablet Take 1 tab every Tue and Sat 30 minutes prior Lasix. 11/06/21  Yes Enzo Bi, MD  potassium chloride SA (KLOR-CON M) 20 MEQ tablet Take 2 tablets (40 mEq total) by mouth daily. 11/28/21  Yes Tecumseh Yeagley A, FNP  senna-docusate (SENOKOT-S) 8.6-50 MG tablet Take 1 tablet by mouth 2 (two) times daily.   Yes [provider]  doxycycline (VIBRAMYCIN) 100 MG capsule Take 100 mg by mouth 2 (two) times daily.  Patient not taking: Reported on 03/06/2022 01/11/22   [provider]   Review of Systems  Constitutional:  Positive for fatigue (fluctuates). Negative for appetite change.  HENT:  Negative for congestion, postnasal drip and sore throat.   Eyes: Negative.   Respiratory:  Positive for shortness of breath and wheezing (at times). Negative for cough and chest tightness.   Cardiovascular:  Positive for palpitations and leg swelling (lymphedema). Negative for chest pain.  Gastrointestinal:  Negative for abdominal distention, abdominal pain, constipation and diarrhea.  Endocrine: Negative.   Genitourinary: Negative.    Musculoskeletal:  Negative for back pain.  Skin: Negative.        Severe lymphedema  Allergic/Immunologic: Negative.   Neurological:  Negative for dizziness and light-headedness.  Hematological:  Negative for adenopathy. Does not bruise/bleed easily.  Psychiatric/Behavioral:  Positive for sleep disturbance (chronic trouble falling asleep; sleeps in recliner). Negative for dysphoric mood. The patient is nervous/anxious.    Vitals:   03/06/22 1119  BP: 137/82  Pulse: 73  Resp: 16  SpO2: 98%  Weight: (!) 397 lb 8 oz (180.3 kg)  Height: '5\' 3"'$  (1.6 m)   Wt Readings from Last 3 Encounters:  03/06/22 (!) 397 lb 8 oz (180.3 kg)  01/25/22 (!) 389 lb 6 oz (176.6 kg)  01/11/22 (!) 390 lb (176.9 kg)   Lab Results  Component Value Date   CREATININE 1.69 (H) 01/25/2022   CREATININE 1.23 (H) 01/11/2022   CREATININE 1.69 (H) 01/03/2022   Physical Exam Vitals and nursing note reviewed.  Constitutional:      Appearance: She is well-developed.  HENT:     Head: Normocephalic and atraumatic.  Cardiovascular:     Rate and Rhythm: Normal rate and regular rhythm.  Pulmonary:     Effort: Pulmonary effort is normal. No accessory muscle usage.     Breath sounds: No wheezing, rhonchi or rales.  Abdominal:     Palpations: Abdomen is soft.     Tenderness: There is no abdominal tenderness.  Musculoskeletal:     Cervical back: Normal range of motion and neck supple.     Right lower leg: Edema present.     Left lower leg: Edema present.     Comments: Severe lymphedema  Skin:    General: Skin is warm and dry.  Neurological:     General: No focal deficit present.     Mental Status: She is alert and oriented to person, place, and time.  Psychiatric:        Mood and Affect: Mood is not depressed.        Behavior: Behavior normal.   Assessment & Plan:  1: Chronic heart failure with preserved ejection fraction without structural changes- - NYHA class III - euvolemic today - attempting to  weigh daily with standing on 2 scales but it's been fluctuating quite a bit; she's going to look into finding a wide enough scale for her to stand on - weight up 8 pounds from last visit here 5 weeks ago - limiting her sodium intake but occasionally adds "a pinch" of salt to meats; does not like [name], garlic powder etc - understands to keep daily fluid intake to 60-64 ounces/ day - facial/mouth swelling with ACEi - saw cardiology Margarito Courser) 11/15/21 - will stop furosemide and begin bumex '2mg'$  daily; if she starts responding great, she can cut the tablet in half - will check BMP next visit - she says that she's tried torsemide in the past but then  had subsequent weight gain - BNP 10/31/21 was 123.9  2: HTN with stage 3 CKD- - BP mildly elevated (137/82) - sees PCP Daryel Gerald) @ [facility] and she has to call and schedule an appointment - nephrology referral had been made but their office said they didn't receive the referral so a referral was faxed to their office today - BMP 01/25/22 reviewed and showed sodium 141, potassium 3.6, creatinine 1.69 and GFR 33  3: Lymphedema- - went to wound center earlier today - she feels like lymphedema may be a little worse - changing diuretic per above  4: Sleep apnea- - wearing CPAP nightly   Medication list reviewed.  Return in 1 month, sooner if needed.

## 2022-03-15 ENCOUNTER — Encounter: Payer: Medicare HMO | Attending: Physician Assistant

## 2022-03-15 DIAGNOSIS — G4733 Obstructive sleep apnea (adult) (pediatric): Secondary | ICD-10-CM | POA: Diagnosis not present

## 2022-03-15 DIAGNOSIS — I110 Hypertensive heart disease with heart failure: Secondary | ICD-10-CM | POA: Insufficient documentation

## 2022-03-15 DIAGNOSIS — I509 Heart failure, unspecified: Secondary | ICD-10-CM | POA: Insufficient documentation

## 2022-03-15 DIAGNOSIS — L97821 Non-pressure chronic ulcer of other part of left lower leg limited to breakdown of skin: Secondary | ICD-10-CM | POA: Diagnosis not present

## 2022-03-15 DIAGNOSIS — Z6841 Body Mass Index (BMI) 40.0 and over, adult: Secondary | ICD-10-CM | POA: Insufficient documentation

## 2022-03-15 DIAGNOSIS — Q820 Hereditary lymphedema: Secondary | ICD-10-CM | POA: Insufficient documentation

## 2022-03-15 DIAGNOSIS — L97811 Non-pressure chronic ulcer of other part of right lower leg limited to breakdown of skin: Secondary | ICD-10-CM | POA: Insufficient documentation

## 2022-03-15 DIAGNOSIS — L03115 Cellulitis of right lower limb: Secondary | ICD-10-CM | POA: Diagnosis not present

## 2022-03-15 DIAGNOSIS — E11621 Type 2 diabetes mellitus with foot ulcer: Secondary | ICD-10-CM | POA: Diagnosis not present

## 2022-03-15 NOTE — Progress Notes (Signed)
BRIEANNE, [name] ([id number]) Visit Report for 03/15/2022 Arrival Information Details Patient Name: [name], [name]. Date of Service: 03/15/2022 11:15 AM Medical Record Number: [id number] Patient Account Number: [id number] Date of Birth/Sex: [birth year] (66 y.o. F) Treating RN: Carlene Coria Primary Care Artemio Dobie: Ranae Plumber Other Clinician: Referring Taneika Choi: Ranae Plumber Treating Kenna Kirn/Extender: Skipper Cliche in Treatment: 29 Visit Information History Since Last Visit All ordered tests and consults were completed: No Patient Arrived: Ambulatory Added or deleted any medications: No Arrival Time: 11:00 Any new allergies or adverse reactions: No Accompanied By: self Had a fall or experienced change in No Transfer Assistance: None activities of daily living that may affect Patient Has Alerts: Yes risk of falls: Patient Alerts: Borderline Diabetic Signs or symptoms of abuse/neglect since last visito No Hospitalized since last visit: No Implantable device outside of the clinic excluding No cellular tissue based products placed in the center since last visit: Has Dressing in Place as Prescribed: Yes Has Compression in Place as Prescribed: Yes Pain Present Now: No Electronic Signature(s) Signed: 03/15/2022 2:06:03 PM By: Carlene Coria RN Entered By: Carlene Coria on 03/15/2022 14:06:02 Bocch[place], [name] ([id number]) -------------------------------------------------------------------------------- Clinic Level of Care Assessment Details Patient Name: [name]. Date of Service: 03/15/2022 11:15 AM Medical Record Number: [id number] Patient Account Number: [id number] Date of Birth/Sex: Apr 17, 1956 (66 y.o. F) Treating RN: Carlene Coria Primary Care Arriana Lohmann: Ranae Plumber Other Clinician: Referring Adora Yeh: Ranae Plumber Treating Talena Neira/Extender: Skipper Cliche in Treatment: 46 Clinic Level of Care Assessment Items TOOL 4 Quantity Score X - Use when only an  EandM is performed on FOLLOW-UP visit 1 0 ASSESSMENTS - Nursing Assessment / Reassessment X - Reassessment of Co-morbidities (includes updates in patient status) 1 10 X- 1 5 Reassessment of Adherence to Treatment Plan ASSESSMENTS - Wound and Skin Assessment / Reassessment X - Simple Wound Assessment / Reassessment - one wound 1 5 '[]'$  - 0 Complex Wound Assessment / Reassessment - multiple wounds '[]'$  - 0 Dermatologic / Skin Assessment (not related to wound area) ASSESSMENTS - Focused Assessment '[]'$  - Circumferential Edema Measurements - multi extremities 0 '[]'$  - 0 Nutritional Assessment / Counseling / Intervention '[]'$  - 0 Lower Extremity Assessment (monofilament, tuning fork, pulses) '[]'$  - 0 Peripheral Arterial Disease Assessment (using hand held doppler) ASSESSMENTS - Ostomy and/or Continence Assessment and Care '[]'$  - Incontinence Assessment and Management 0 '[]'$  - 0 Ostomy Care Assessment and Management (repouching, etc.) PROCESS - Coordination of Care X - Simple Patient / Family Education for ongoing care 1 15 '[]'$  - 0 Complex (extensive) Patient / Family Education for ongoing care '[]'$  - 0 Staff obtains Programmer, systems, Records, Test Results / Process Orders '[]'$  - 0 Staff telephones HHA, Nursing Homes / Clarify orders / etc '[]'$  - 0 Routine Transfer to another Facility (non-emergent condition) '[]'$  - 0 Routine Hospital Admission (non-emergent condition) '[]'$  - 0 New Admissions / Biomedical engineer / Ordering NPWT, Apligraf, etc. '[]'$  - 0 Emergency Hospital Admission (emergent condition) X- 1 10 Simple Discharge Coordination '[]'$  - 0 Complex (extensive) Discharge Coordination PROCESS - Special Needs '[]'$  - Pediatric / Minor Patient Management 0 '[]'$  - 0 Isolation Patient Management '[]'$  - 0 Hearing / Language / Visual special needs '[]'$  - 0 Assessment of Community assistance (transportation, D/C planning, etc.) '[]'$  - 0 Additional assistance / Altered mentation '[]'$  - 0 Support Surface(s)  Assessment (bed, cushion, seat, etc.) INTERVENTIONS - Wound Cleansing / Measurement Meinhardt, Malessa C. ([id number]) X- 1 5 Simple Wound Cleansing -  one wound '[]'$  - 0 Complex Wound Cleansing - multiple wounds X- 1 5 Wound Imaging (photographs - any number of wounds) '[]'$  - 0 Wound Tracing (instead of photographs) X- 1 5 Simple Wound Measurement - one wound '[]'$  - 0 Complex Wound Measurement - multiple wounds INTERVENTIONS - Wound Dressings X - Small Wound Dressing one or multiple wounds 1 10 '[]'$  - 0 Medium Wound Dressing one or multiple wounds '[]'$  - 0 Large Wound Dressing one or multiple wounds '[]'$  - 0 Application of Medications - topical '[]'$  - 0 Application of Medications - injection INTERVENTIONS - Miscellaneous '[]'$  - External ear exam 0 '[]'$  - 0 Specimen Collection (cultures, biopsies, blood, body fluids, etc.) '[]'$  - 0 Specimen(s) / Culture(s) sent or taken to Lab for analysis '[]'$  - 0 Patient Transfer (multiple staff / Civil Service fast streamer / Similar devices) '[]'$  - 0 Simple Staple / Suture removal (25 or less) '[]'$  - 0 Complex Staple / Suture removal (26 or more) '[]'$  - 0 Hypo / Hyperglycemic Management (close monitor of Blood Glucose) '[]'$  - 0 Ankle / Brachial Index (ABI) - do not check if billed separately X- 1 5 Vital Signs Has the patient been seen at the hospital within the last three years: Yes Total Score: 75 Level Of Care: New/Established - Level 2 Electronic Signature(s) Unsigned Entered ByCarlene Coria on 03/15/2022 14:07:32 Signature(s): Date(s): [name] ([id number]) -------------------------------------------------------------------------------- Encounter Discharge Information Details Patient Name: Elizabeth, [name]. Date of Service: 03/15/2022 11:15 AM Medical Record Number: [id number] Patient Account Number: [id number] Date of Birth/Sex: [birth year] (66 y.o. F) Treating RN: Carlene Coria Primary Care Ignazio Kincaid: Ranae Plumber Other Clinician: Referring Tramya Schoenfelder: Ranae Plumber Treating Coryn Mosso/Extender: Skipper Cliche in Treatment: 32 Encounter Discharge Information Items Discharge Condition: Stable Ambulatory Status: Ambulatory Discharge Destination: Home Transportation: Private Auto Accompanied By: self Schedule Follow-up Appointment: Yes Clinical Summary of Care: Electronic Signature(s) Signed: 03/15/2022 2:07:10 PM By: Carlene Coria RN Entered By: Carlene Coria on 03/15/2022 14:07:10 [name], [name] ([id number]) -------------------------------------------------------------------------------- Wound Assessment Details Patient Name: [name] Date of Service: 03/15/2022 11:15 AM Medical Record Number: [id number] Patient Account Number: [id number] Date of Birth/Sex: [birth year] (66 y.o. F) Treating RN: Carlene Coria Primary Care Zuzanna Maroney: Ranae Plumber Other Clinician: Referring Tannisha Kennington: Ranae Plumber Treating Nevyn Bossman/Extender: Skipper Cliche in Treatment: 32 Wound Status Wound Number: 10 Primary Lymphedema Etiology: Wound Location: Left, Medial, Anterior Lower Leg Wound Open Wounding Event: Gradually Appeared Status: Date Acquired: 10/17/2021 Comorbid Cataracts, Chronic sinus problems/congestion, Anemia, Weeks Of Treatment: 21 History: Lymphedema, Asthma, Sleep Apnea, Tuberculosis, Clustered Wound: No Angina, Hypertension, Gout, Osteoarthritis Wound Measurements Length: (cm) 0.6 Width: (cm) 0.7 Depth: (cm) 0.1 Area: (cm) 0.33 Volume: (cm) 0.033 % Reduction in Area: 95.8% % Reduction in Volume: 95.8% Epithelialization: Large (67-100%) Tunneling: No Undermining: No Wound Description Classification: Full Thickness Without Exposed Support Structu Exudate Amount: Medium Exudate Type: Serosanguineous Exudate Color: red, brown res Foul Odor After Cleansing: No Slough/Fibrino Yes Wound Bed Granulation Amount: Medium (34-66%) Exposed Structure Granulation Quality: Red Fat Layer (Subcutaneous Tissue) Exposed:  Yes Necrotic Amount: Small (1-33%) Treatment Notes Wound #10 (Lower Leg) Wound Laterality: Left, Medial, Anterior Cleanser Soap and Water Discharge Instruction: Gently cleanse wound with antibacterial soap, rinse and pat dry prior to dressing wounds Peri-Wound Care Topical Primary Dressing SILVERCEL Antimicrobial Alginate Dressing, 1x12 (in/in) Secondary Dressing ABD Pad 5x9 (in/in) Discharge Instruction: Cover with ABD pad [place] Roll-Medium Discharge Instruction: Bruce as directed Secured With ACE WRAP -  44M ACE Elastic Bandage With VELCRO Brand Closure, 4 (in) Compression Wrap Compression Stockings Add-Ons TAIYA, NUTTING ([id number]) Electronic Signature(s) Signed: 03/15/2022 2:06:24 PM By: Carlene Coria RN Entered By: Carlene Coria on 03/15/2022 14:06:24

## 2022-03-16 NOTE — Progress Notes (Signed)
JACKQULINE, BRANCA ([id number]) Visit Report for 03/15/2022 Physician Orders Details Patient Name: [name], [name]. Date of Service: 03/15/2022 11:15 AM Medical Record Number: [id number] Patient Account Number: [id number] Date of Birth/Sex: August 04, 1955 (66 y.o. F) Treating RN: Carlene Coria Primary Care Provider: Ranae Plumber Other Clinician: Referring Provider: Ranae Plumber Treating Provider/Extender: Skipper Cliche in Treatment: 25 Verbal / Phone Orders: No Diagnosis Coding Follow-up Appointments o Return Appointment in 1 week. o Nurse Visit as needed Bathing/ Shower/ Hygiene o Clean wound with Normal Saline or wound cleanser. o Wash wounds with antibacterial soap and water. o May shower; gently cleanse wound with antibacterial soap, rinse and pat dry prior to dressing wounds o No tub bath. Edema Control - Lymphedema / Segmental Compressive Device / Other o Ace wraps o Elevate, Exercise Daily and Avoid Standing for Long Periods of Time. o Elevate leg(s) parallel to the floor when sitting. o DO YOUR BEST to sleep in the bed at night. DO NOT sleep in your recliner. Long hours of sitting in a recliner leads to swelling of the legs and/or potential wounds on your backside. o Other: - use lymphedema pumps Non-Wound Condition Bilateral Lower Extremities o Cleanse affected area with antibacterial soap and water, o Apply appropriate compression. - ace wrap o Additional non-wound orders/instructions: - silver cell to open weeping areas with abd pad Medications-Please add to medication list. o P.O. Antibiotics - start doxycycline as directed Wound Treatment Wound #10 - Lower Leg Wound Laterality: Left, Medial, Anterior Cleanser: Soap and Water (Generic) Discharge Instructions: Gently cleanse wound with antibacterial soap, rinse and pat dry prior to dressing wounds Primary Dressing: SILVERCEL Antimicrobial Alginate Dressing, 1x12 (in/in)  (Generic) Secondary Dressing: ABD Pad 5x9 (in/in) (Generic) Discharge Instructions: Cover with ABD pad Secondary Dressing: Conforming [place] Roll-Medium (Generic) Discharge Instructions: Apply Conforming Stretch [place] Bandage as directed Secured With: ACE WRAP - 33M ACE Elastic Bandage With VELCRO Brand Closure, 4 (in) (Generic) Electronic Signature(s) Signed: 03/15/2022 2:06:47 PM By: Carlene Coria RN Signed: 03/15/2022 5:41:38 PM By: Worthy Keeler PA-C Entered By: Carlene Coria on 03/15/2022 14:06:46 [place], [name] ([id number]) -------------------------------------------------------------------------------- SuperBill Details Patient Name: [name]. Date of Service: 03/15/2022 Medical Record Number: [id number] Patient Account Number: [id number] Date of Birth/Sex: 1956-06-18 (66 y.o. 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

## 2022-03-20 ENCOUNTER — Encounter: Payer: Medicare HMO | Admitting: Physician Assistant

## 2022-03-20 DIAGNOSIS — E11621 Type 2 diabetes mellitus with foot ulcer: Secondary | ICD-10-CM | POA: Diagnosis not present

## 2022-03-20 NOTE — Progress Notes (Addendum)
[name], [name] ([id number]) Visit Report for 03/20/2022 Chief Complaint Document Details Patient Name: [name], [name]. Date of Service: 03/20/2022 10:45 AM Medical Record Number: [id number] Patient Account Number: [id number] Date of Birth/Sex: [birth year] (66 y.o. F) Treating RN: Cornell Barman Primary Care Provider: Ranae Plumber Other Clinician: Referring Provider: Ranae Plumber Treating Provider/Extender: Skipper Cliche in Treatment: 33 Information Obtained from: Patient Chief Complaint Bilateral LE lymphedema with Left LE Ulcers Electronic Signature(s) Signed: 03/20/2022 10:59:34 AM By: Worthy Keeler PA-C Entered By: Worthy Keeler on 03/20/2022 10:59:34 [name], [name] ([id number]) -------------------------------------------------------------------------------- HPI Details Patient Name: [name] Date of Service: 03/20/2022 10:45 AM Medical Record Number: [id number] Patient Account Number: [id number] Date of Birth/Sex: May 08, 1956 (66 y.o. F) Treating RN: Cornell Barman Primary Care Provider: Ranae Plumber Other Clinician: Referring Provider: Ranae Plumber Treating Provider/Extender: Skipper Cliche in Treatment: 33 History of Present Illness HPI Description: The patient is a 66 year old female with history of hypertension and a long-standing history of bilateral lower extremity lymphedema (first presented on 4/2) . She has had open ulcers in the past which have always responded to compression therapy. She had briefly been to a lymphedema clinic in the past which helped her at the time. this time around she stopped treatment of her lymphedema pumps approximately 2 weeks ago because of some pain in the knees and then noticed the right leg getting worse. She was seen by her PCP who put her on clindamycin 4 times a day 2 days ago. The patient has seen [facility] and Dr. Delana Meyer had seen her last year where a vascular study including venous and arterial duplex studies were  within normal limits. he had recommended compression stockings and lymphedema pumps and the patient has been using this in about 2 weeks ago. She is known to be diabetic but in the past few time she's gone to her primary care doctor her hemoglobin A1c has been normal. 02/11/2015 - after her last visit she took my advice and went to the ER regarding the progressive cellulitis of her right lower extremity and she was admitted between July 17 and 22nd. She received IV antibiotics and then was sent home on a course of steroid-induced and oral antibiotics. She has improved much since then. 02/17/2015 -- she has been doing fine and the weeping of her legs has remarkably gone down. She has no fresh issues. READMISSION 01/15/18 This patient was given this clinic before most recently in 2016 seen by Dr. Con Memos. She has massive bilateral lymphedema and over the last 2 months this had weeping edema out of the left leg. She has compression pumps but her compliance with these has been minimal. She has [facility] they've been using TCA/ABDs/kerlix under an Ace wrap.she has had recent problems with cellulitis. She was apparently seen in the ER and 12/23/17 and given clindamycin. She was then followed by her primary doctor and given doxycycline and Keflex. The pain seems to have settled down. In April 2018 the patient had arterial studies done at [facility]. This showed triphasic waveforms throughout the right leg and mostly triphasic waveforms on the left except for monophasic at the posterior tibial artery distally. She was not felt to have evidence of right lower extremity arterial stenosis or significant problems on the left side. 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 She also had a right lower extremity venous Doppler in January 2018 this was limited by the patient's body  habitus and lymphedema. Most of the proximal veins were not visualized The patient  presents with an area of denuded skin on the anterior medial part of the left calf. There is weeping edema fluid here. 01/22/18; the patient has somewhat better edema control using her compression pumps twice a day and as a result she has much better epithelialization on the left anterior calf area. Only a small open area remains. 01/29/18; the patient has been compliant with her compression pumps. Both the areas on her calf that healed. The remaining area on the left anterior leg is fully epithelialized Readmission: 02/20/2019 upon evaluation today patient presents for reevaluation due to issues that she is having with the bilateral lower extremities. She actually has wounds open on both legs. On the right she has an area in the crease of her leg on the right around the knee region which is actually draining quite a bit and actually has some fungal type appearance to it. She has been on nystatin powder that seems to have helped to some degree. In regard to the left lower extremity this is actually in the lower portion of her leg closer to the ankle and again is continuing to drain as well unfortunately. There does not appear to be any signs of active infection at this time which is good news. No fevers, chills, nausea, vomiting, or diarrhea. She tells me that since she was seen last year she is actually been doing quite well for the most part with regard to her lower extremities. Unfortunately she now is experiencing a little bit more drainage at this time. She is concerned about getting this under control so that it does not get significantly worse. 02/27/2019 on evaluation today patient appears to be doing somewhat better in regard to her bilateral lower extremity wounds. She has been tolerating the dressing changes without complication. Fortunately there is no signs of active infection at this point. No fevers, chills, nausea, vomiting, or diarrhea. She did get her dressing supplies which is  excellent news she was extremely excited to get these. She also got paperwork from [employer] for their financial assistance program where they may be able to help her out in the future if needed with supplies at discounted prices. 03/06/2019 on evaluation today patient appears to be doing a little worse with regard to both areas of weeping on her bilateral lower extremities. This is around the right medial knee and just above the left ankle. With that being said she is unfortunately not doing as well as I would like to see. I feel like she may need to potentially go see someone at the lymphedema clinic as the wraps that she needs or even beyond what we can do here at the wound care center. She really does not have wounds she just has open areas of weeping that are causing some difficulty for her. Subsequently because of this and the moisture I am concerned about the potential for infection I am going to likely give her a prophylactic antibiotic today, Keflex, just to be on the safe side. Nonetheless again there is no obvious signs of active infection at this time. 03/13/2019 on evaluation today patient appears to be doing well with regard to her bilateral lower extremities where she has been weeping compared to even last week's evaluation. I see some areas of new skin growth which is excellent and overall I am very pleased with how things seem to be progressing. No fevers, chills, nausea, vomiting, or diarrhea. [name],  [name] ([id number]) 03/20/2019 on evaluation today patient unfortunately is continuing to have issues with significant edema of the left lower extremity. Her right side seems to be doing much better. Unfortunately her left side is showing increased weeping of the lower portion of her leg. This is quite unfortunate obviously we were hoping to get her into the lymphedema clinic they really do not seem to when I see her how if she is draining. Despite the fact this is really not wound related  but more lymphedema weeping related. Nonetheless I do not know that this can be helpful for her to even go for that appointment since again I am not sure there is much that they would actually do at this point. We may need to try a 4 layer compression wrap as best we can on her leg. She is on the Augmentin currently although I am still concerned about whether or not there could be potentially something going on infection wise I would obtain a culture though I understand is not the best being that is a surface culture I just 1 to make sure I do not seem to be missing anything. 03/27/2019 on evaluation today patient appears to be doing much better in regard to the left lower extremity compared to last week. Last week she had tremendous weeping which I think was subsequent to infection now she seems to be doing much better and very pleased. This is not completely healed but there is a lot of new skin growth and it has dried out quite a bit. Overall I think that we are doing well with how things are moving along at this time. No fevers, chills, nausea, vomiting, or diarrhea. 04/03/2019 evaluation today patient appears to be doing a little worse this week compared to last time I saw her. I think this may be due to the fact that she is having issues with not being able to sleep in her bed at least not until last night. She is therefore been in a lift chair and subsequently has also had issues with not been able to use her pumps since she could not get in bed. With that being said the patient overall seems to be doing okay I do think I may want extend the antibiotic for a little bit longer at least until we can see if her edema and her weeping gets better and if it is then obviously I can always discontinue the antibiotics as of next week however I want her to continue to have it over the next week. 04/10/2019 on evaluation today patient unfortunately is still doing poorly with regard to her left lower  extremity. Her right is all things considering doing fairly well. On the left however she continues to have spreading of the area of infection and weeping which appears to be even a larger surface area than noted last week. She did have a positive culture for Pseudomonas in particular which seems to have been of concern she still has green/yellow discharge consistent with Pseudomonas and subsequently a tremendous amount of it. This has me obviously still concerned about the infection not really clearing up despite the fact that on culture it appears the Cipro should have been a good option for treating this. I think she may at this point need IV antibiotics since things are not doing better I do not want to get worse and cause sepsis. She is in agreement with the plan and believes as well that she likely does need  to go to the hospital for IV vancomycin. Or something of the like depending on what the recommendation is from the ER. 04/17/2019 on evaluation today patient appears to be doing excellent in regard to her lower extremity on the left. She was in the hospital for several days from when I sent her last we saw her until just this past Tuesday. Fortunately her drainage is significantly improved and in fact is mostly clear. There is just a couple small areas that may still drain a little bit she states that the [facility] they prescribed for her at discharge she went picked up from pharmacy and got home but has not been able to find it since. She is looked everywhere. She is wondering if I will replace that for her today I will be more than happy to do that. 05/01/2019 on evaluation today patient actually appears to be doing quite well with regard to her lower extremities. She occasionally is having areas that will leak and then heal up mainly when a piece of the fibrotic skin pops off but fortunately she is not having any signs of active infection at this time. Overall she also really does not have  any obvious weeping at this time. I do believe however she really needs some compression wraps and I think this may be a good time to get her back to the lymphedema clinic. 05/11/2019 on evaluation today patient actually appears to be doing quite well with regard to her bilateral lower extremities. She occasionally will have a small area that we per another but in general seems to be completely healed which is great news. Overall very pleased with how everything seems to be progressing. She does have her appointment with lymphedema clinic on November 18. 05/25/2019 on evaluation today patient appears to be doing well with regard to her left lower extremity. I am very pleased in this regard. In regard to her right leg this actually did start draining more I think it is mainly due to the fact that her leg is more swollen. I am not seeing any obvious signs of infection at this time although that is definitely something were obviously acutely aware of simply due to the fact that she had an issue not too far back with exactly this issue. Nonetheless I do feel like that lymphedema clinic would still be beneficial for her. I explained obviously if they are not able to do anything treatment wise on the right leg we could at least have them treat her left leg and then proceed from there. The patient is really in agreement with that plan. If they are able to do both as the drainage slows down that I would be happy to let them handle both. 06/01/2019 on evaluation today patient unfortunately appears to be doing worse with regard to her right lower extremity. The left lower extremity is still maintaining at this point. Unfortunately she has been having significantly increased pain over the past several days and has been experiencing as well increased swelling of the right lower extremity. I really do not know that I am seeing anything that appears to be obvious for infection at this point to be peripherally  honest. With that being said the patient does seem to be having much more swelling that she is even experienced in the past and coupled with increased pain in her hip as well I am concerned that again she could potentially have a DVT although I am not 100% sure of this. I think  it something that may need to be checked out. We discussed the possibility of sending her for a DVT study through the hospital but unfortunately transportation is an issue if she does have a DVT I do not want her to wait days to be able to get in for that test however if she has this scheduled as an outpatient that is as fast that she will be able to get the test scheduled for transportation purposes. That will also fall on Thanksgiving so subsequently she did actually be looking at either Friday or even next week before we would know anything back from this. That is much too long in my opinion. Subsequent to the amount of discomfort she is experiencing the patient is actually okay with going to the ER for evaluation today. 06/12/2019 on evaluation today patient actually appears to be doing significantly better compared to last time I saw her. Following when I last saw her she was actually in the hospital from that Monday until the following Sunday almost 1 full week. She actually was placed on Keflex in the hospital following the time for her to be discharged and Dr. Steva Ready has recommended 2 times a day dosing of the Keflex for the next year in order to help with more prophylactic/preventative measures with regard to her developing cellulitis. Overall I think this sounds like an excellent plan. The patient unfortunately is good to have trouble being treated at lymphedema clinic due to the fact that she really cannot get up on the bed that they have there. They also state that they cannot manage her as long as she has anything draining at this point. Obviously that is somewhat unfortunate as she does need help with edema  control but nonetheless we will have to do what we can for her outside of it sounds like the lymphedema clinic scenario at this point. 06/19/2019 on evaluation today patient appears to be doing fairly well with regard to her bilateral lower extremities. She is not nearly as swollen and shows no signs of infection at this point. There is no evidence of cellulitis whatsoever. She also has no open wounds or draining at this point which is also good news. No fever chills noted. She seems to be in very good spirits and in fact appears to be doing quite well. READMISSION 11/27/2019 ANISSIA, WESSELLS ([id number]) This is a 66 year old woman that we have had in this clinic several times before including 2015, 16 and 19 and then most recently from 03/20/2019 through 06/19/2019 with bilateral lower extremity lymphedema. She has had previous arterial and reflux studies done years ago which were not all that remarkable. 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 She does have a positive family history and she had large legs starting may be in her 56s. She was recently in hospital from 10/20/2019 through 10/28/2019 with right leg cellulitis. She was given Ancef and clindamycin and then Zosyn when a culture showed Pseudomonas. At that time there was purulent drainage. She was followed by infectious disease Dr Steva Ready. The patient is now back at home. She has noted increased swelling in the right and no drainage in her right leg mostly on the posterior medial aspect in the calf area. She has not had pain or fever. She has literally been improved lysing above dressings because her at the area of this is far too large for standard compression. She has been wrapping the areas with sheets to resorptive  pads. She is found these helped somewhat. She does have an appointment with the lymphedema clinic in [place] in late June. Past medical history includes bilateral  lymphedema, hypertension, obstructive sleep apnea with CPAP. Recent hospitalization with apparently Pseudomonas cellulitis of the right lower leg 12/15/2019 upon evaluation today patient appears to be doing a little bit worse in regard to her right lower extremity. Unfortunately she is having more weeping down in the lower portion of her leg. Fortunately there is no signs of active infection at this time. No fever chills noted. The patient states she is not having increased pain except for when she attempted to use the lymphedema pumps unfortunately she states that she did have pain when she did this. Otherwise we been using absorptive dressings of one type or another she is using diapers at home and then subsequently Ace wraps. In regard to the barrier cream we have discussed the possibility of derma cloud which she would like to try I do not have a problem with that. 12/22/2019 upon evaluation today patient actually appears to be doing better in regard to her leg ulcers at this point. Fortunately there does not appear to be any signs of active infection which is great news and I am extremely pleased with where things are progressing at this time. There is no sign of active infection currently. The patient is very pleased to see things doing so well. 12/29/2019 upon evaluation today patient appears to be doing a little bit better in regard to her weeping in general over her lower extremities. She does have some signs of mild erythema little bit more than what I noted last week or rather last visit. Nonetheless I think that my threshold for switching her antibiotics from Keflex to something else is very low at this point considering that she has had such severe infections in the past that seem to come almost out of nowhere. There is a little erythema and warmth noted of the lower portion of her leg compared to the upper which also makes me want to go ahead and address things more rapidly at this point.  Likely I would switch out the Keflex for something like Levaquin ideally. 7/16; patient with severe bilateral lymphedema. She has superficial wounds albeit almost circumferential now on the left lateral lower leg. This may be new from last time. Small area on the right anterior lower leg and then another area on the right medial lower leg and of pannus fold. She has been using various absorptive garments. She states she is using her compression pumps once a day occasionally twice. Culture from her last visit here was negative 01/29/2020 on evaluation today patient appears to be doing excellent at this point in regard to her legs with regard to infection I see no signs of active infection at this point. She still does have unfortunately areas of weeping this is minimal on the right now her left is actually significantly worse although I do not think it is as bad as last week with Dr. Dellia Nims saw her. She has been trying to pump and elevate her legs is much as possible. She has previously been on the Keflex and in the past for prevention that seems to do fairly well and likely can extend that today. 02/04/2020 on evaluation today patient appears to be doing better in regard to her legs bilaterally. Fortunately there is no signs of active infection at this time which is great news and overall she has less weeping  on the left compared to the right and there is several spots where she is pretty much sealed up with no draining regions. Overall very pleased in this regard. 02/19/2020 on evaluation today patient appears to be doing very well in regard to her wounds currently. Fortunately there is no evidence of active infection overall very pleased with where things stand. She is significantly improved in regard to her edema I am extremely pleased in this regard she tells me that the popping no longer hurts and in fact she actually looks forward to it. 03/04/2020 on evaluation today patient appears to be doing  excellent in regard to her lower extremities. Fortunately there is no signs of active infection at this time. No fevers, chills, nausea, vomiting, or diarrhea. 03/25/2020 on evaluation today patient appears to be doing a little bit more poorly in regard to her legs at this point. She tells me that she is still continue to have issues with drainage and this has been a little bit worse she was getting ready to start taking the Keflex again but wanted to see me first. Fortunately there is no signs of active infection at this time. No fevers, chills, nausea, vomiting, or diarrhea. 04/15/2020 upon evaluation today patient appears to be doing somewhat poorly in regard to her right leg. She tells me she has been having more pain she has been taking the Keflex that was previously prescribed unfortunately that just does not seem to help with this. She was hoping that the pain on her right was actually coming from the fact that she was having issues with her wrap having gotten caught in her recliner. With that being said she tells me that she knew something was not right. Currently her right leg is warm to touch along with being erythematous all the way up to around at least mid thigh as far as I can see. The left leg does not appear to be doing that badly though there is increased weeping around the ankle region. 05/06/2020 on evaluation today patient appears to be doing much better than last time I saw her. She did go to the hospital where she was admitted for 2 days and treated with antibiotic therapy. She was discharged with antibiotics as well and has done extremely well. I am extremely pleased with where things stand today. There is no signs of active infection at this time which is great news. 05/27/2020 upon evaluation today patient appears to be doing well with regard to her lower extremities bilaterally. She has just a very tiny area on the right leg which is opening on the left leg she is significantly  improved though she still has several areas that do appear to be open this is minimal compared to what is been in the past. In general I am extremely pleased with where things stand today. The patient does tell me she is not been using her pumps quite as much as she should be. I do believe that is 1 area she can definitely work on. She has had a lot going on including a Covid exposure and apparently also a outbreak of likely shingles. 06/24/2020 upon evaluation today patient appears to be doing well with regard to her legs in general although the left leg unfortunately is showing some signs of erythema she does have a little bit of increased weeping and to be honest I am concerned about infection here. I discussed that with her today and I think that we may need to address  this sooner rather than later she has been taking Keflex she is not really certain that is been making a big improvement however. No fevers, chills, nausea, vomiting, or diarrhea. [name], [name] ([id number]) 06/30/2020 upon evaluation today patient's legs actually seem to be doing better in my opinion as compared to where they were last week. Fortunately there does not appear to be any signs of active infection. Her culture showed multiple organisms nothing predominate. With that being said the Levaquin seems to have done well I think she has improved since I last saw her as well. 07/14/2020 upon evaluation today patient appears to be doing actually better in regard to her lower extremities in my opinion. She has been tolerating the dressing changes without complication. Fortunately there is no signs of active infection at this time. 08/04/2020 upon evaluation today patient appears to be doing excellent in regard to her leg ulcers. Fortunately she has very little that is open at this point. This is great news. In fact I think that the Goldbond medicated powder has been excellent for her. It seems to have done the trick where we had  tried several other things without as much success. Fortunately there is no evidence of active infection at this time. No fevers, chills, nausea, vomiting, or diarrhea. 09/01/2020 upon evaluation today patient appears to be doing a little bit more poorly than the last time I saw her. She tells me right now that she has been having a lot of drainage compared to where things were previous which has unfortunately led to more irritation as well. She is concerned that this is leading to infection. Again based on what I am seeing today as well I am also concerned of the same to be honest. 09/15/2020 upon evaluation today patient appears to be doing well with regard to her legs compared to what I saw previous. Fortunately there does not appear to be any evidence of active infection at this time which is great news. At least not as badly as what it was previous. With that being said I do believe that the patient does need to have an extension of the antibiotics. This I believe will actually help her more in the way of making sure this stays under control and does not worsen. That was the Bactrim DS. Readmission: 08/01/2021 upon evaluation today patient appears to be doing actually pretty well all things considered. Is actually been almost a year since have seen her last March. Fortunately I do not see any evidence of active infection locally nor systemically at this point. She does have a couple open areas on the left leg the right leg appears to be doing quite well. In general she has been in the hospital 3 times since I saw her a year ago all 4 cellulitis type issues except for the last 1 which was actually more related to congestive heart failure for that reason she is not using her lymphedema pumps at this point and that is probably the best idea. 08/08/2021 upon evaluation today patient appears to be doing well with regard to her legs all things considered her left leg may be a little bit more swollen based  on what I see today. Fortunately I do not see any signs of active infection locally nor systemically at this time which is great news. No fevers, chills, nausea, vomiting, or diarrhea. 08/15/2021 upon evaluation today patient actually appears to be doing excellent in regard to her wounds. She has been tolerating the dressing  changes without complication. Fortunately I see no evidence of infection currently which is great news. No fevers, chills, nausea, vomiting, or diarrhea. 08/29/2021 upon evaluation patient appears to be doing excellent she in fact is almost completely healed. I am actually very pleased with where we stand today and I think that she is making wonderful progress she just has a small area of weeping on the right lateral leg everything else is pretty much completely healed which is also. 09/05/2021 upon evaluation today patient appears to be doing well with regard to her legs. She does have a small area of weeping on the right leg and there is a small area on the left leg as well. It is potentially something that may need to be addressed in the future more specifically but right now I think that there may have just been a little drainage here on the left. With all that being said I think that this still does not appear to be infected and overall I feel like she is doing quite well. That something we always have to keep a very close eye on as she can change very rapidly from okay to not okay. 09/12/2021 upon evaluation today patient appears to be doing a little bit worse in regard to her leg and actually somewhat concerned about the possibility of infection here. I discussed that with the patient. For that reason I am going to go ahead and see about getting her set up for a repeat prescription for the Bactrim. She is in agreement with that plan. 3/14; patient presents for follow-up. She has been using silver alginate with dressing changes. She is still taking Bactrim prescribed at last  clinic visit. She has no issues or complaints today. She has lymphedema pumps however does not use these. 09/26/2020 upon evaluation today patient appears to actually be doing well in regard to her right leg I am pleased in that regard. Unfortunately she has new areas on her left leg that are open at this point that I do need to be addressed. I am also concerned about the warmth around one of the wounds on the more anterior side. I think this is something that we may need to culture today potentially requiring antibiotics going forward. 10/03/2021 upon evaluation today patient unfortunately is not doing nearly as well as she was even last week with her wounds. She is having some issues here with increased cellulitis of the left lower extremity unfortunately. I did prescribe Augmentin for her eczema prescribed last week unfortunately she never actually received this prescription. The pharmacy stated they never got it. We double checked with them today they still did not receive it. I am not sure what happened in that regard. With that being said the patient is in good spirits today she does not appear to be as sick as where she normally is when the cellulitis gets significantly worse as it has in the past. Nonetheless the leg is much more red not just around the ankle where I was seeing at last week on Tuesday but rather now this is going all the way up to almost her knee and is definitely hot to touch compared to the right leg. Nonetheless I feel like she does need to go to the ER for likely IV antibiotics. 10-10-2021 upon evaluation today patient appears to be doing well with regard to her wound. She has been tolerating the dressing changes that appears to be doing much better. After I saw her last week she  did go to the ER they admitted her to the hospital and gave her IV antibiotics she fortunately is doing significantly better. Overall I am extremely pleased with where things stand today. 10-17-2021  upon evaluation today patient appears to be doing well with regard to her wound. In fact this is looking better and her leg is looking better but she still does have some areas here of erythema. We will continue to address this as soon as possible in my opinion. I do believe she may require some debridement and what appears to be a more defined wound on her left leg at this point. 10-24-2021 upon evaluation patient is definitely showing signs of improvement which is great news. I do not see any evidence of active infection locally or systemically which is great news as well. No fevers, chills, nausea, vomiting, or diarrhea. 10-31-2021. Upon evaluation today patient's leg actually feels better from an infection and wound care perspective. I am actually very pleased in this regard. Unfortunately the biggest issue that I see at this time is that the patient is having a significant issue with her breathing. I did put her on the pulse ox machine to check her oxygen saturation and it was pretty much at 100% which is good but she tells me that when she is walking [name], [name] ([id number]) this is much more difficult for her. She also tells me that when she is trying to bend over to perform her dressing changes she is so short of breath due to the swelling and fluid in her abdominal area that she is just not been able to do this. She is tearful and very concerned today to be honest. I am truly sorry to see her this way I know she is really having a hard time at the moment. 11-14-2021 upon evaluation patient actually appears to be doing significantly better compared to when I last saw her. She was actually admitted to the hospital I believe this for 6 days total after I sent her on 10-31-2021. Subsequently they actually pulled off greater than 50 pounds of fluid she tells me she feels significantly better her legs are not as tight she actually has some play in the tissue and it is obvious that she is doing  much better just from a mobility standpoint as well as a breathing standpoint. Overall I am extremely happy for her and how she is doing the leg also looks much better on the left. 11-21-2021 upon evaluation today patient appears to be doing well although it does appear that she is going require some sharp debridement the wound is very dry this is the opposite of what its been she was having so much weeping that it was staying extremely wet. Nonetheless we do need to see about going ahead and debriding the wound today. 11-28-2021 upon evaluation today patient appears to be doing well with regard to her wound I definitely see signs of things continuing to improve which is great news. I do not see any evidence of active infection locally or systemically also great news. 12-05-2021 upon evaluation today patient appears to be doing well with regard to her wound. She has been tolerating the dressing changes. Fortunately there does not appear to be any signs of active infection locally or systemically at this time. No fevers, chills, nausea, vomiting, or diarrhea. 12-12-2021 upon evaluation patient appears to be doing well with regard to her wound this is actually measuring smaller and looking much better. Fortunately I do  not see any signs of active infection locally or systemically at this time which is excellent news. 6/13; small wound area in the folds of her lymphedema just above the left ankle. Fortunately the area looks quite good. She has been using silver alginate ABDs and an Ace wrap which she is able to change her self. She is not using her compression pumps out of fear that this could contribute to heart failure. 12-26-2021 upon evaluation patient's wounds are actually showing signs of excellent improvement. I am very pleased with where things stand. Overall I think that she has been making excellent progress and I think we are very close to complete resolution which is great news. 01-11-2022 upon  evaluation today patient appears to be doing well currently in regard to her legs in general although on the left leg she does have 1 area that still irritated and inflamed on the anterior portion of her leg. Fortunately I do not see any signs of systemic infection locally there might be some infection going on here however which is my biggest concern. Fortunately I do not see any evidence though of this spreading to any other location. 01-16-2022 upon evaluation today patient has actually showing signs of excellent improvement. Fortunately I do not see any evidence of infection locally or systemically which is great news and overall I am very pleased with where we stand at this point. 01-25-2022 upon evaluation today patient actually appears to be doing decently well in regard to her legs. She has been tolerating the dressing changes without complication. Fortunately there does not appear to be any evidence of active infection locally or systemically which is great news and overall I am extremely pleased with where we stand at this point. 02-13-2022 upon evaluation today patient appears to be doing well currently in regard to her wound which is actually showing signs of significant improvement. Fortunately I do not see any evidence of active infection locally or systemically at this time. No fevers, chills, nausea, vomiting, or diarrhea. 02-20-2022 upon evaluation today patient appears to be doing well currently in regard to her wound. She has been tolerating the dressing changes without complication. Fortunately there does not appear to be any signs of active infection locally or systemically at this time. No fevers, chills, nausea, vomiting, or diarrhea. 02-27-2022 upon evaluation today patient appears to be doing excellent in regard to her wounds on the leg. She has been tolerating the dressing changes without complication this is very close to complete resolution. Fortunately I do not see any signs of  infection locally or systemically at this time which is great news. No fevers, chills, nausea, vomiting, or diarrhea. 03-06-2022 upon evaluation today patient appears to be doing well currently in regard to her wound in general. She has been tolerating the dressing changes without complication. Fortunately I do not see any evidence of active infection locally or systemically at this time which is great news. I am concerned a little bit however about not really her wounds but the second toe right foot where there appears to be some cellulitis after she dropped a frying pan on this about 2 weeks ago. She tells me its been itching and burning and I do think that it is possible she may have an infection based on what we are seeing currently. 03-20-2022 upon evaluation today patient actually appears to be doing much better at this point. Fortunately there does not appear to be any signs of active infection locally or systemically at this  time which is great news. No fevers, chills, nausea, vomiting, or diarrhea. Electronic Signature(s) Signed: 03/20/2022 11:21:47 AM By: Worthy Keeler PA-C Entered By: Worthy Keeler on 03/20/2022 11:21:47 [place], [name] ([id number]) -------------------------------------------------------------------------------- Physical Exam Details Patient Name: [name], [name] Date of Service: 03/20/2022 10:45 AM Medical Record Number: [id number] Patient Account Number: [id number] Date of Birth/Sex: Jul 31, 1955 (66 y.o. F) Treating RN: Cornell Barman Primary Care Provider: Ranae Plumber Other Clinician: Referring Provider: Ranae Plumber Treating Provider/Extender: Skipper Cliche in Treatment: 33 Constitutional Obese and well-hydrated in no acute distress. Respiratory normal breathing without difficulty. Psychiatric this patient is able to make decisions and demonstrates good insight into disease process. Alert and Oriented x 3. pleasant and cooperative. Notes Upon  inspection patient's wounds are showing signs of improvement the redness is better her right toe is completely healed and okay there is no signs of an issue here at all. The left leg is significantly improved compared to what we were previous. Electronic Signature(s) Signed: 03/20/2022 11:22:03 AM By: Worthy Keeler PA-C Entered By: Worthy Keeler on 03/20/2022 11:22:03 [name] ([id number]) -------------------------------------------------------------------------------- Physician Orders Details Patient Name: [name] Date of Service: 03/20/2022 10:45 AM Medical Record Number: [id number] Patient Account Number: [id number] Date of Birth/Sex: 03-13-1956 (66 y.o. 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 o Nurse Visit as needed Bathing/ Shower/ Hygiene o Clean wound with Normal Saline or wound cleanser. o Wash wounds with antibacterial soap and water. o May shower; gently cleanse wound with antibacterial soap, rinse and pat dry prior to dressing wounds o No tub bath. Edema Control - Lymphedema / Segmental Compressive Device / Other o Ace wraps o Elevate, Exercise Daily and Avoid Standing for Long Periods of Time. o Elevate leg(s) parallel to the floor when sitting. o DO YOUR BEST to sleep in the bed at night. DO NOT sleep in your recliner. Long hours of sitting in a recliner leads to swelling of the legs and/or potential wounds on your backside. o Other: - use  lymphedema pumps Non-Wound Condition Bilateral Lower Extremities o Cleanse affected area with antibacterial soap and water, o Apply appropriate compression. - ace wrap o Additional non-wound orders/instructions: - silver cell to open weeping areas with abd pad Medications-Please add to medication list. o P.O. Antibiotics - continue doxycycline as directed Wound Treatment Wound #10 - Lower Leg Wound Laterality: Left, Medial, Anterior Cleanser: Soap and Water (Generic) Discharge Instructions: Gently cleanse wound with antibacterial soap, rinse and pat dry prior to dressing wounds Primary Dressing: SILVERCEL Antimicrobial Alginate Dressing, 1x12 (in/in) (Generic) Secondary Dressing: Chuks Secured With: ACE WRAP - 47M ACE Elastic Bandage With VELCRO Brand Closure, 4 (in) (Generic) Electronic Signature(s) Signed: 03/20/2022 5:16:25 PM By: Worthy Keeler PA-C Signed: 03/20/2022 5:46:57 PM By: Gretta Cool, BSN, RN, CWS, Kim RN, BSN Entered By: Gretta Cool, BSN, RN, CWS, Kim on 03/20/2022 11:25:03 [name] ([id number]) -------------------------------------------------------------------------------- Problem List Details Patient Name: LUMA, CLOPPER C. Date of Service: 03/20/2022 10:45 AM Medical Record Number: [id number] Patient Account Number: [id number] Date of Birth/Sex: 1955/12/05 (66  y.o. F) Treating RN: Cornell Barman Primary Care Provider: Ranae Plumber Other Clinician: Referring Provider: Ranae Plumber Treating Provider/Extender: Skipper Cliche in Treatment: 33 Active Problems ICD-10 Encounter Code Description Active Date MDM Diagnosis Q82.0 Hereditary lymphedema 08/01/2021 No Yes L97.811 Non-pressure chronic ulcer of other part of right lower leg limited to 08/01/2021 No Yes breakdown of skin L97.821 Non-pressure chronic ulcer of other part of left lower leg limited to 08/01/2021 No Yes breakdown of skin Inactive Problems Resolved Problems Electronic Signature(s) Signed:  03/20/2022 10:59:27 AM By: Worthy Keeler PA-C Entered By: Worthy Keeler on 03/20/2022 10:59:27 [place], [name] ([id number]) -------------------------------------------------------------------------------- Progress Note Details Patient Name: [name]. Date of Service: 03/20/2022 10:45 AM Medical Record Number: [id number] Patient Account Number: [id number] Date of Birth/Sex: Nov 28, 1955 (66 y.o. F) Treating RN: Cornell Barman Primary Care Provider: Ranae Plumber Other Clinician: Referring Provider: Ranae Plumber Treating Provider/Extender: Skipper Cliche in Treatment: 21 Subjective Chief Complaint Information obtained from Patient Bilateral LE lymphedema with Left LE Ulcers History of Present Illness (HPI) The patient is a 66 year old female with history of hypertension and a long-standing history of bilateral lower extremity lymphedema (first presented on 4/2) . She has had open ulcers in the past which have always responded to compression therapy. She had briefly been to a lymphedema clinic in the past which helped her at the time. this time around she stopped treatment of her lymphedema pumps approximately 2 weeks ago because of some pain in the knees and then noticed the right leg getting worse. She was seen by her PCP who put her on clindamycin 4 times a day 2 days ago. The patient has seen [facility] and Dr. Delana Meyer had seen her last year where a vascular study including venous and arterial duplex studies were within normal limits. he had recommended compression stockings and lymphedema pumps and the patient has been using this in about 2 weeks ago. She is known to be diabetic but in the past few time she's gone to her primary care doctor her hemoglobin A1c has been normal. 02/11/2015 - after her last visit she took my advice and went to the ER regarding the progressive cellulitis of her right lower extremity and she was admitted between July 17 and 22nd. She received IV  antibiotics and then was sent home on a course of steroid-induced and oral antibiotics. She has improved much since then. 02/17/2015 -- she has been doing fine and the weeping of her legs has remarkably gone down. She has no fresh issues. READMISSION 01/15/18 This patient was given this clinic before most recently in 2016 seen by Dr. Con Memos. She has massive bilateral lymphedema and over the last 2 months this had weeping edema out of the left leg. She has compression pumps but her compliance with these has been minimal. She has [facility] they've been using TCA/ABDs/kerlix under an Ace wrap.she has had recent problems with cellulitis. She was apparently seen in the ER and 12/23/17 and given clindamycin. She was then followed by her primary doctor and given doxycycline and Keflex. The pain seems to have settled down. In April 2018 the patient had arterial studies done at  [facility]. This showed triphasic waveforms throughout the right leg and mostly triphasic waveforms on the left except for monophasic at the posterior tibial artery distally. She was not felt to have evidence of right lower extremity arterial stenosis or significant problems on the left side. 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 She also had a right lower extremity venous Doppler in January 2018 this was limited by the patient's body habitus and lymphedema. Most of the proximal veins were not visualized The patient presents with an area of denuded skin on the anterior medial part of the left calf. There is weeping edema fluid here. 01/22/18; the patient has somewhat better edema control using her compression pumps twice a day and as a result she has much better epithelialization on the left anterior calf area. Only a small open area remains. 01/29/18; the patient has been compliant with her compression pumps. Both the areas on her calf that healed. The remaining area on the  left anterior leg is fully epithelialized Readmission: 02/20/2019 upon evaluation today patient presents for reevaluation due to issues that she is having with the bilateral lower extremities. She actually has wounds open on both legs. On the right she has an area in the crease of her leg on the right around the knee region which is actually draining quite a bit and actually has some fungal type appearance to it. She has been on nystatin powder that seems to have helped to some degree. In regard to the left lower extremity this is actually in the lower portion of her leg closer to the ankle and again is continuing to drain as well unfortunately. There does not appear to be any signs of active infection at this time which is good news. No fevers, chills, nausea, vomiting, or diarrhea. She tells me that since she was seen last year she is actually been doing quite well for the most part with regard to her lower extremities. Unfortunately she now is experiencing a little bit more drainage at this time. She is concerned about getting this under control so that it does not get significantly worse. 02/27/2019 on evaluation today patient appears to be doing somewhat better in regard to her bilateral lower extremity wounds. She has been tolerating the dressing changes without complication. Fortunately there is no signs of active infection at this point. No fevers, chills, nausea, vomiting, or diarrhea. She did get her dressing supplies which is excellent news she was extremely excited to get these. She also got paperwork from [employer] for their financial assistance program where they may be able to help her out in the future if needed with supplies at discounted prices. 03/06/2019 on evaluation today patient appears to be doing a little worse with regard to both areas of weeping on her bilateral lower extremities. This is around the right medial knee and just above the left ankle. With that being said she is  unfortunately not doing as well as I would like to see. I feel like she may need to potentially go see someone at the lymphedema clinic as the wraps that she needs or even beyond what we can do here at the wound care center. She really does not have wounds she just has open areas of weeping that are causing some difficulty for her. Subsequently because of this and the moisture I am concerned about the potential for infection I am going to likely give her a prophylactic antibiotic today, Keflex, just to be on the safe side. Nonetheless again there is no obvious signs of active infection at this time. [name], [name] ([id number]) 03/13/2019 on evaluation today patient appears to be doing well with regard to her bilateral lower extremities where she has been weeping compared to even last week's evaluation. I see some areas  of new skin growth which is excellent and overall I am very pleased with how things seem to be progressing. No fevers, chills, nausea, vomiting, or diarrhea. 03/20/2019 on evaluation today patient unfortunately is continuing to have issues with significant edema of the left lower extremity. Her right side seems to be doing much better. Unfortunately her left side is showing increased weeping of the lower portion of her leg. This is quite unfortunate obviously we were hoping to get her into the lymphedema clinic they really do not seem to when I see her how if she is draining. Despite the fact this is really not wound related but more lymphedema weeping related. Nonetheless I do not know that this can be helpful for her to even go for that appointment since again I am not sure there is much that they would actually do at this point. We may need to try a 4 layer compression wrap as best we can on her leg. She is on the Augmentin currently although I am still concerned about whether or not there could be potentially something going on infection wise I would obtain a culture though I  understand is not the best being that is a surface culture I just 1 to make sure I do not seem to be missing anything. 03/27/2019 on evaluation today patient appears to be doing much better in regard to the left lower extremity compared to last week. Last week she had tremendous weeping which I think was subsequent to infection now she seems to be doing much better and very pleased. This is not completely healed but there is a lot of new skin growth and it has dried out quite a bit. Overall I think that we are doing well with how things are moving along at this time. No fevers, chills, nausea, vomiting, or diarrhea. 04/03/2019 evaluation today patient appears to be doing a little worse this week compared to last time I saw her. I think this may be due to the fact that she is having issues with not being able to sleep in her bed at least not until last night. She is therefore been in a lift chair and subsequently has also had issues with not been able to use her pumps since she could not get in bed. With that being said the patient overall seems to be doing okay I do think I may want extend the antibiotic for a little bit longer at least until we can see if her edema and her weeping gets better and if it is then obviously I can always discontinue the antibiotics as of next week however I want her to continue to have it over the next week. 04/10/2019 on evaluation today patient unfortunately is still doing poorly with regard to her left lower extremity. Her right is all things considering doing fairly well. On the left however she continues to have spreading of the area of infection and weeping which appears to be even a larger surface area than noted last week. She did have a positive culture for Pseudomonas in particular which seems to have been of concern she still has green/yellow discharge consistent with Pseudomonas and subsequently a tremendous amount of it. This has me obviously still  concerned about the infection not really clearing up despite the fact that on culture it appears the Cipro should have been a good option for treating this. I think she may at this point need IV antibiotics since things are not doing better I do  not want to get worse and cause sepsis. She is in agreement with the plan and believes as well that she likely does need to go to the hospital for IV vancomycin. Or something of the like depending on what the recommendation is from the ER. 04/17/2019 on evaluation today patient appears to be doing excellent in regard to her lower extremity on the left. She was in the hospital for several days from when I sent her last we saw her until just this past Tuesday. Fortunately her drainage is significantly improved and in fact is mostly clear. There is just a couple small areas that may still drain a little bit she states that the [facility] they prescribed for her at discharge she went picked up from pharmacy and got home but has not been able to find it since. She is looked everywhere. She is wondering if I will replace that for her today I will be more than happy to do that. 05/01/2019 on evaluation today patient actually appears to be doing quite well with regard to her lower extremities. She occasionally is having areas that will leak and then heal up mainly when a piece of the fibrotic skin pops off but fortunately she is not having any signs of active infection at this time. Overall she also really does not have any obvious weeping at this time. I do believe however she really needs some compression wraps and I think this may be a good time to get her back to the lymphedema clinic. 05/11/2019 on evaluation today patient actually appears to be doing quite well with regard to her bilateral lower extremities. She occasionally will have a small area that we per another but in general seems to be completely healed which is great news. Overall very pleased with  how everything seems to be progressing. She does have her appointment with lymphedema clinic on November 18. 05/25/2019 on evaluation today patient appears to be doing well with regard to her left lower extremity. I am very pleased in this regard. In regard to her right leg this actually did start draining more I think it is mainly due to the fact that her leg is more swollen. I am not seeing any obvious signs of infection at this time although that is definitely something were obviously acutely aware of simply due to the fact that she had an issue not too far back with exactly this issue. Nonetheless I do feel like that lymphedema clinic would still be beneficial for her. I explained obviously if they are not able to do anything treatment wise on the right leg we could at least have them treat her left leg and then proceed from there. The patient is really in agreement with that plan. If they are able to do both as the drainage slows down that I would be happy to let them handle both. 06/01/2019 on evaluation today patient unfortunately appears to be doing worse with regard to her right lower extremity. The left lower extremity is still maintaining at this point. Unfortunately she has been having significantly increased pain over the past several days and has been experiencing as well increased swelling of the right lower extremity. I really do not know that I am seeing anything that appears to be obvious for infection at this point to be peripherally honest. With that being said the patient does seem to be having much more swelling that she is even experienced in the past and coupled with increased pain in her hip  as well I am concerned that again she could potentially have a DVT although I am not 100% sure of this. I think it something that may need to be checked out. We discussed the possibility of sending her for a DVT study through the hospital but unfortunately transportation is an issue if she  does have a DVT I do not want her to wait days to be able to get in for that test however if she has this scheduled as an outpatient that is as fast that she will be able to get the test scheduled for transportation purposes. That will also fall on Thanksgiving so subsequently she did actually be looking at either Friday or even next week before we would know anything back from this. That is much too long in my opinion. Subsequent to the amount of discomfort she is experiencing the patient is actually okay with going to the ER for evaluation today. 06/12/2019 on evaluation today patient actually appears to be doing significantly better compared to last time I saw her. Following when I last saw her she was actually in the hospital from that Monday until the following Sunday almost 1 full week. She actually was placed on Keflex in the hospital following the time for her to be discharged and Dr. Steva Ready has recommended 2 times a day dosing of the Keflex for the next year in order to help with more prophylactic/preventative measures with regard to her developing cellulitis. Overall I think this sounds like an excellent plan. The patient unfortunately is good to have trouble being treated at lymphedema clinic due to the fact that she really cannot get up on the bed that they have there. They also state that they cannot manage her as long as she has anything draining at this point. Obviously that is somewhat unfortunate as she does need help with edema control but nonetheless we will have to do what we can for her outside of it sounds like the lymphedema clinic scenario at this point. 06/19/2019 on evaluation today patient appears to be doing fairly well with regard to her bilateral lower extremities. She is not nearly as swollen Kingma, Cinderella C. ([id number]) and shows no signs of infection at this point. There is no evidence of cellulitis whatsoever. She also has no open wounds or draining at this  point which is also good news. No fever chills noted. She seems to be in very good spirits and in fact appears to be doing quite well. READMISSION 11/27/2019 This is a 66 year old woman that we have had in this clinic several times before including 2015, 16 and 19 and then most recently from 03/20/2019 through 06/19/2019 with bilateral lower extremity lymphedema. She has had previous arterial and reflux studies done years ago which were not all that remarkable. 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 She does have a positive family history and she had large legs starting may be in her 78s. She was recently in hospital from 10/20/2019 through 10/28/2019 with right leg cellulitis. She was given Ancef and clindamycin and then Zosyn when a culture showed Pseudomonas. At that time there was purulent drainage. She was followed by infectious disease Dr Steva Ready. The patient is now back at home. She has noted increased swelling in the right and no drainage in her right leg mostly on the posterior medial aspect in the calf area. She has not had pain or fever. She has literally been improved lysing above dressings  because her at the area of this is far too large for standard compression. She has been wrapping the areas with sheets to resorptive pads. She is found these helped somewhat. She does have an appointment with the lymphedema clinic in [place] in late June. Past medical history includes bilateral lymphedema, hypertension, obstructive sleep apnea with CPAP. Recent hospitalization with apparently Pseudomonas cellulitis of the right lower leg 12/15/2019 upon evaluation today patient appears to be doing a little bit worse in regard to her right lower extremity. Unfortunately she is having more weeping down in the lower portion of her leg. Fortunately there is no signs of active infection at this time. No fever chills noted. The patient states she  is not having increased pain except for when she attempted to use the lymphedema pumps unfortunately she states that she did have pain when she did this. Otherwise we been using absorptive dressings of one type or another she is using diapers at home and then subsequently Ace wraps. In regard to the barrier cream we have discussed the possibility of derma cloud which she would like to try I do not have a problem with that. 12/22/2019 upon evaluation today patient actually appears to be doing better in regard to her leg ulcers at this point. Fortunately there does not appear to be any signs of active infection which is great news and I am extremely pleased with where things are progressing at this time. There is no sign of active infection currently. The patient is very pleased to see things doing so well. 12/29/2019 upon evaluation today patient appears to be doing a little bit better in regard to her weeping in general over her lower extremities. She does have some signs of mild erythema little bit more than what I noted last week or rather last visit. Nonetheless I think that my threshold for switching her antibiotics from Keflex to something else is very low at this point considering that she has had such severe infections in the past that seem to come almost out of nowhere. There is a little erythema and warmth noted of the lower portion of her leg compared to the upper which also makes me want to go ahead and address things more rapidly at this point. Likely I would switch out the Keflex for something like Levaquin ideally. 7/16; patient with severe bilateral lymphedema. She has superficial wounds albeit almost circumferential now on the left lateral lower leg. This may be new from last time. Small area on the right anterior lower leg and then another area on the right medial lower leg and of pannus fold. She has been using various absorptive garments. She states she is using her compression pumps  once a day occasionally twice. Culture from her last visit here was negative 01/29/2020 on evaluation today patient appears to be doing excellent at this point in regard to her legs with regard to infection I see no signs of active infection at this point. She still does have unfortunately areas of weeping this is minimal on the right now her left is actually significantly worse although I do not think it is as bad as last week with Dr. Dellia Nims saw her. She has been trying to pump and elevate her legs is much as possible. She has previously been on the Keflex and in the past for prevention that seems to do fairly well and likely can extend that today. 02/04/2020 on evaluation today patient appears to be doing better in regard to  her legs bilaterally. Fortunately there is no signs of active infection at this time which is great news and overall she has less weeping on the left compared to the right and there is several spots where she is pretty much sealed up with no draining regions. Overall very pleased in this regard. 02/19/2020 on evaluation today patient appears to be doing very well in regard to her wounds currently. Fortunately there is no evidence of active infection overall very pleased with where things stand. She is significantly improved in regard to her edema I am extremely pleased in this regard she tells me that the popping no longer hurts and in fact she actually looks forward to it. 03/04/2020 on evaluation today patient appears to be doing excellent in regard to her lower extremities. Fortunately there is no signs of active infection at this time. No fevers, chills, nausea, vomiting, or diarrhea. 03/25/2020 on evaluation today patient appears to be doing a little bit more poorly in regard to her legs at this point. She tells me that she is still continue to have issues with drainage and this has been a little bit worse she was getting ready to start taking the Keflex again but wanted to  see me first. Fortunately there is no signs of active infection at this time. No fevers, chills, nausea, vomiting, or diarrhea. 04/15/2020 upon evaluation today patient appears to be doing somewhat poorly in regard to her right leg. She tells me she has been having more pain she has been taking the Keflex that was previously prescribed unfortunately that just does not seem to help with this. She was hoping that the pain on her right was actually coming from the fact that she was having issues with her wrap having gotten caught in her recliner. With that being said she tells me that she knew something was not right. Currently her right leg is warm to touch along with being erythematous all the way up to around at least mid thigh as far as I can see. The left leg does not appear to be doing that badly though there is increased weeping around the ankle region. 05/06/2020 on evaluation today patient appears to be doing much better than last time I saw her. She did go to the hospital where she was admitted for 2 days and treated with antibiotic therapy. She was discharged with antibiotics as well and has done extremely well. I am extremely pleased with where things stand today. There is no signs of active infection at this time which is great news. 05/27/2020 upon evaluation today patient appears to be doing well with regard to her lower extremities bilaterally. She has just a very tiny area on the right leg which is opening on the left leg she is significantly improved though she still has several areas that do appear to be open this is minimal compared to what is been in the past. In general I am extremely pleased with where things stand today. The patient does tell me she is not been using her pumps quite as much as she should be. I do believe that is 1 area she can definitely work on. She has had a lot going on including a Covid exposure and apparently also a outbreak of likely shingles. [name], [name] ([id number]) 06/24/2020 upon evaluation today patient appears to be doing well with regard to her legs in general although the left leg unfortunately is showing some signs of erythema she does have a little bit  of increased weeping and to be honest I am concerned about infection here. I discussed that with her today and I think that we may need to address this sooner rather than later she has been taking Keflex she is not really certain that is been making a big improvement however. No fevers, chills, nausea, vomiting, or diarrhea. 06/30/2020 upon evaluation today patient's legs actually seem to be doing better in my opinion as compared to where they were last week. Fortunately there does not appear to be any signs of active infection. Her culture showed multiple organisms nothing predominate. With that being said the Levaquin seems to have done well I think she has improved since I last saw her as well. 07/14/2020 upon evaluation today patient appears to be doing actually better in regard to her lower extremities in my opinion. She has been tolerating the dressing changes without complication. Fortunately there is no signs of active infection at this time. 08/04/2020 upon evaluation today patient appears to be doing excellent in regard to her leg ulcers. Fortunately she has very little that is open at this point. This is great news. In fact I think that the Goldbond medicated powder has been excellent for her. It seems to have done the trick where we had tried several other things without as much success. Fortunately there is no evidence of active infection at this time. No fevers, chills, nausea, vomiting, or diarrhea. 09/01/2020 upon evaluation today patient appears to be doing a little bit more poorly than the last time I saw her. She tells me right now that she has been having a lot of drainage compared to where things were previous which has unfortunately led to more irritation as well. She is  concerned that this is leading to infection. Again based on what I am seeing today as well I am also concerned of the same to be honest. 09/15/2020 upon evaluation today patient appears to be doing well with regard to her legs compared to what I saw previous. Fortunately there does not appear to be any evidence of active infection at this time which is great news. At least not as badly as what it was previous. With that being said I do believe that the patient does need to have an extension of the antibiotics. This I believe will actually help her more in the way of making sure this stays under control and does not worsen. That was the Bactrim DS. Readmission: 08/01/2021 upon evaluation today patient appears to be doing actually pretty well all things considered. Is actually been almost a year since have seen her last March. Fortunately I do not see any evidence of active infection locally nor systemically at this point. She does have a couple open areas on the left leg the right leg appears to be doing quite well. In general she has been in the hospital 3 times since I saw her a year ago all 4 cellulitis type issues except for the last 1 which was actually more related to congestive heart failure for that reason she is not using her lymphedema pumps at this point and that is probably the best idea. 08/08/2021 upon evaluation today patient appears to be doing well with regard to her legs all things considered her left leg may be a little bit more swollen based on what I see today. Fortunately I do not see any signs of active infection locally nor systemically at this time which is great news. No fevers, chills, nausea, vomiting, or  diarrhea. 08/15/2021 upon evaluation today patient actually appears to be doing excellent in regard to her wounds. She has been tolerating the dressing changes without complication. Fortunately I see no evidence of infection currently which is great news. No fevers, chills,  nausea, vomiting, or diarrhea. 08/29/2021 upon evaluation patient appears to be doing excellent she in fact is almost completely healed. I am actually very pleased with where we stand today and I think that she is making wonderful progress she just has a small area of weeping on the right lateral leg everything else is pretty much completely healed which is also. 09/05/2021 upon evaluation today patient appears to be doing well with regard to her legs. She does have a small area of weeping on the right leg and there is a small area on the left leg as well. It is potentially something that may need to be addressed in the future more specifically but right now I think that there may have just been a little drainage here on the left. With all that being said I think that this still does not appear to be infected and overall I feel like she is doing quite well. That something we always have to keep a very close eye on as she can change very rapidly from okay to not okay. 09/12/2021 upon evaluation today patient appears to be doing a little bit worse in regard to her leg and actually somewhat concerned about the possibility of infection here. I discussed that with the patient. For that reason I am going to go ahead and see about getting her set up for a repeat prescription for the Bactrim. She is in agreement with that plan. 3/14; patient presents for follow-up. She has been using silver alginate with dressing changes. She is still taking Bactrim prescribed at last clinic visit. She has no issues or complaints today. She has lymphedema pumps however does not use these. 09/26/2020 upon evaluation today patient appears to actually be doing well in regard to her right leg I am pleased in that regard. Unfortunately she has new areas on her left leg that are open at this point that I do need to be addressed. I am also concerned about the warmth around one of the wounds on the more anterior side. I think this is  something that we may need to culture today potentially requiring antibiotics going forward. 10/03/2021 upon evaluation today patient unfortunately is not doing nearly as well as she was even last week with her wounds. She is having some issues here with increased cellulitis of the left lower extremity unfortunately. I did prescribe Augmentin for her eczema prescribed last week unfortunately she never actually received this prescription. The pharmacy stated they never got it. We double checked with them today they still did not receive it. I am not sure what happened in that regard. With that being said the patient is in good spirits today she does not appear to be as sick as where she normally is when the cellulitis gets significantly worse as it has in the past. Nonetheless the leg is much more red not just around the ankle where I was seeing at last week on Tuesday but rather now this is going all the way up to almost her knee and is definitely hot to touch compared to the right leg. Nonetheless I feel like she does need to go to the ER for likely IV antibiotics. 10-10-2021 upon evaluation today patient appears to be doing well with regard  to her wound. She has been tolerating the dressing changes that appears to be doing much better. After I saw her last week she did go to the ER they admitted her to the hospital and gave her IV antibiotics she fortunately is doing significantly better. Overall I am extremely pleased with where things stand today. 10-17-2021 upon evaluation today patient appears to be doing well with regard to her wound. In fact this is looking better and her leg is looking better but she still does have some areas here of erythema. We will continue to address this as soon as possible in my opinion. I do believe she may require some debridement and what appears to be a more defined wound on her left leg at this point. 10-24-2021 upon evaluation patient is definitely showing signs of  improvement which is great news. I do not see any evidence of active infection [place], [place]. ([id number]) locally or systemically which is great news as well. No fevers, chills, nausea, vomiting, or diarrhea. 10-31-2021. Upon evaluation today patient's leg actually feels better from an infection and wound care perspective. I am actually very pleased in this regard. Unfortunately the biggest issue that I see at this time is that the patient is having a significant issue with her breathing. I did put her on the pulse ox machine to check her oxygen saturation and it was pretty much at 100% which is good but she tells me that when she is walking this is much more difficult for her. She also tells me that when she is trying to bend over to perform her dressing changes she is so short of breath due to the swelling and fluid in her abdominal area that she is just not been able to do this. She is tearful and very concerned today to be honest. I am truly sorry to see her this way I know she is really having a hard time at the moment. 11-14-2021 upon evaluation patient actually appears to be doing significantly better compared to when I last saw her. She was actually admitted to the hospital I believe this for 6 days total after I sent her on 10-31-2021. Subsequently they actually pulled off greater than 50 pounds of fluid she tells me she feels significantly better her legs are not as tight she actually has some play in the tissue and it is obvious that she is doing much better just from a mobility standpoint as well as a breathing standpoint. Overall I am extremely happy for her and how she is doing the leg also looks much better on the left. 11-21-2021 upon evaluation today patient appears to be doing well although it does appear that she is going require some sharp debridement the wound is very dry this is the opposite of what its been she was having so much weeping that it was staying extremely wet.  Nonetheless we do need to see about going ahead and debriding the wound today. 11-28-2021 upon evaluation today patient appears to be doing well with regard to her wound I definitely see signs of things continuing to improve which is great news. I do not see any evidence of active infection locally or systemically also great news. 12-05-2021 upon evaluation today patient appears to be doing well with regard to her wound. She has been tolerating the dressing changes. Fortunately there does not appear to be any signs of active infection locally or systemically at this time. No fevers, chills, nausea, vomiting, or diarrhea. 12-12-2021 upon  evaluation patient appears to be doing well with regard to her wound this is actually measuring smaller and looking much better. Fortunately I do not see any signs of active infection locally or systemically at this time which is excellent news. 6/13; small wound area in the folds of her lymphedema just above the left ankle. Fortunately the area looks quite good. She has been using silver alginate ABDs and an Ace wrap which she is able to change her self. She is not using her compression pumps out of fear that this could contribute to heart failure. 12-26-2021 upon evaluation patient's wounds are actually showing signs of excellent improvement. I am very pleased with where things stand. Overall I think that she has been making excellent progress and I think we are very close to complete resolution which is great news. 01-11-2022 upon evaluation today patient appears to be doing well currently in regard to her legs in general although on the left leg she does have 1 area that still irritated and inflamed on the anterior portion of her leg. Fortunately I do not see any signs of systemic infection locally there might be some infection going on here however which is my biggest concern. Fortunately I do not see any evidence though of this spreading to any other  location. 01-16-2022 upon evaluation today patient has actually showing signs of excellent improvement. Fortunately I do not see any evidence of infection locally or systemically which is great news and overall I am very pleased with where we stand at this point. 01-25-2022 upon evaluation today patient actually appears to be doing decently well in regard to her legs. She has been tolerating the dressing changes without complication. Fortunately there does not appear to be any evidence of active infection locally or systemically which is great news and overall I am extremely pleased with where we stand at this point. 02-13-2022 upon evaluation today patient appears to be doing well currently in regard to her wound which is actually showing signs of significant improvement. Fortunately I do not see any evidence of active infection locally or systemically at this time. No fevers, chills, nausea, vomiting, or diarrhea. 02-20-2022 upon evaluation today patient appears to be doing well currently in regard to her wound. She has been tolerating the dressing changes without complication. Fortunately there does not appear to be any signs of active infection locally or systemically at this time. No fevers, chills, nausea, vomiting, or diarrhea. 02-27-2022 upon evaluation today patient appears to be doing excellent in regard to her wounds on the leg. She has been tolerating the dressing changes without complication this is very close to complete resolution. Fortunately I do not see any signs of infection locally or systemically at this time which is great news. No fevers, chills, nausea, vomiting, or diarrhea. 03-06-2022 upon evaluation today patient appears to be doing well currently in regard to her wound in general. She has been tolerating the dressing changes without complication. Fortunately I do not see any evidence of active infection locally or systemically at this time which is great news. I am concerned a  little bit however about not really her wounds but the second toe right foot where there appears to be some cellulitis after she dropped a frying pan on this about 2 weeks ago. She tells me its been itching and burning and I do think that it is possible she may have an infection based on what we are seeing currently. 03-20-2022 upon evaluation today patient actually appears to  be doing much better at this point. Fortunately there does not appear to be any signs of active infection locally or systemically at this time which is great news. No fevers, chills, nausea, vomiting, or diarrhea. [name], [name] ([id number]) Objective Constitutional Obese and well-hydrated in no acute distress. Vitals Time Taken: 10:55 AM, Height: 63 in, Weight: 372 lbs, BMI: 65.9, Temperature: 98.2 F, Pulse: 74 bpm, Respiratory Rate: 18 breaths/min, Blood Pressure: 129/85 mmHg, Pulse Oximetry: 99 %. Respiratory normal breathing without difficulty. Psychiatric this patient is able to make decisions and demonstrates good insight into disease process. Alert and Oriented x 3. pleasant and cooperative. General Notes: Upon inspection patient's wounds are showing signs of improvement the redness is better her right toe is completely healed and okay there is no signs of an issue here at all. The left leg is significantly improved compared to what we were previous. Integumentary (Hair, Skin) Wound #10 status is Open. Original cause of wound was Gradually Appeared. The date acquired was: 10/17/2021. The wound has been in treatment 22 weeks. The wound is located on the Left,Medial,Anterior Lower Leg. The wound measures 2cm length x 2cm width x 0.1cm depth; 3.142cm^2 area and 0.314cm^3 volume. There is Fat Layer (Subcutaneous Tissue) exposed. There is no tunneling or undermining noted. There is a medium amount of serosanguineous drainage noted. The wound margin is indistinct and nonvisible. There is medium (34-66%) red granulation  within the wound bed. There is a small (1-33%) amount of necrotic tissue within the wound bed. 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 Nurse Visit as needed Bathing/ Shower/ Hygiene: Clean wound with Normal Saline or wound cleanser. Wash wounds with antibacterial soap and water. May shower; gently cleanse wound with antibacterial soap, rinse and pat dry prior to dressing wounds No tub bath. Edema Control - Lymphedema / Segmental Compressive Device / Other: Ace wraps Elevate, Exercise Daily and Avoid Standing for Long Periods of Time. Elevate leg(s) parallel to the floor when sitting. DO YOUR BEST to sleep in the bed at night. DO NOT sleep in your recliner. Long hours of sitting in a recliner leads to swelling of the legs and/or potential wounds on your backside. Other: - use lymphedema pumps Non-Wound Condition: Cleanse affected area with antibacterial soap and water, Apply appropriate compression. - ace wrap Additional non-wound orders/instructions: - silver cell to open weeping areas with abd pad Medications-Please add to medication list.: P.O. Antibiotics - continue doxycycline as directed WOUND #10: - Lower Leg Wound Laterality: Left, Medial, Anterior Cleanser: Soap and Water (Generic) Discharge Instructions: Gently cleanse wound with antibacterial soap, rinse and pat dry prior to dressing wounds Primary Dressing: SILVERCEL Antimicrobial Alginate Dressing, 1x12 (in/in) (Generic) Secondary Dressing: ABD Pad 5x9 (in/in) (Generic) Discharge Instructions: Cover with ABD pad Secondary Dressing: Conforming Guaze Roll-Medium (Generic) [name], [name] ([id number]) Discharge Instructions: Apply Conforming Stretch Guaze Bandage as  directed Secured With: ACE WRAP - 63M ACE Elastic Bandage With VELCRO Brand Closure, 4 (in) (Generic) 1. I would recommend currently that we go ahead and continue with the recommendation for wound care measures as before and the patient is in agreement with plan we will can use the Tucks pads that instead of the roll gauze since this seems to be potentially rubbing. 2. I would recommend she finish  off the doxycycline. 3. We will use silver cell for any open areas at this point. We will see patient back for reevaluation in 1 week here in the clinic. If anything worsens or changes patient will contact our office for additional recommendations. Electronic Signature(s) Signed: 03/20/2022 11:22:26 AM By: Worthy Keeler PA-C Entered By: Worthy Keeler on 03/20/2022 11:22:26 [name] ([id number]) -------------------------------------------------------------------------------- SuperBill Details Patient Name: [name] Date of Service: 03/20/2022 Medical Record Number: [id number] Patient Account Number: [id number] Date of Birth/Sex: [birth year] (66 y.o. F) Treating RN: Cornell Barman Primary Care Provider: Ranae Plumber Other Clinician: Referring Provider: Ranae Plumber Treating Provider/Extender: Skipper Cliche in Treatment: 33 Diagnosis Coding ICD-10 Codes Code Description Q82.0 Hereditary lymphedema L97.811 Non-pressure chronic ulcer of other part of right lower leg limited to breakdown of skin L97.821 Non-pressure chronic ulcer of other part of left lower leg limited to breakdown of skin Physician Procedures CPT4 Code: [id number] Description: 16837 - WC PHYS LEVEL 3 - EST PT Modifier: Quantity: 1 CPT4 Code: Description: ICD-10 Diagnosis Description Q82.0 Hereditary lymphedema L97.811 Non-pressure chronic ulcer of other part of right lower leg limited to bre L97.821 Non-pressure chronic ulcer of other part of left lower leg limited to brea Modifier: akdown of skin kdown of  skin Quantity: Electronic Signature(s) Signed: 03/20/2022 11:22:46 AM By: Worthy Keeler PA-C Entered By: Worthy Keeler on 03/20/2022 11:22:46

## 2022-03-20 NOTE — Progress Notes (Signed)
ELASHA, TESS ([id number]) Visit Report for 03/20/2022 Arrival Information Details Patient Name: [name], [name]. Date of Service: 03/20/2022 10:45 AM Medical Record Number: [id number] Patient Account Number: [id number] Date of Birth/Sex: 03-May-1956 (66 y.o. F) Treating RN: [name] Primary Care [name]: [name] Other Clinician: Referring [name]: [name] Treating [name]/Extender: [name] in Treatment: 33 Visit Information History Since Last Visit Added or deleted any medications: No Patient Arrived: Walker Has Dressing in Place as Prescribed: Yes Arrival Time: 10:54 Pain Present Now: No Accompanied By: self Transfer Assistance: None Patient Identification Verified: Yes Secondary Verification Process Completed: Yes Patient Has Alerts: Yes Patient Alerts: Borderline Diabetic Electronic Signature(s) Signed: 03/20/2022 5:46:57 PM By: [name], BSN, RN, CWS, Kim RN, BSN Entered By: [name] on 03/20/2022 10:55:46 [name] ([id number]) -------------------------------------------------------------------------------- Clinic Level of Care Assessment Details Patient Name: [name]. Date of Service: 03/20/2022 10:45 AM Medical Record Number: [id number] Patient Account Number: [id number] Date of Birth/Sex: 1956/04/10 (66 y.o. F) Treating RN: [name] Primary Care [name]: [name] Other Clinician: Referring [name]: [name] Treating [name]/Extender: [name] in Treatment: 33 Clinic Level of Care Assessment Items TOOL 4 Quantity Score '[]'$  - Use when only an EandM is performed on FOLLOW-UP visit 0 ASSESSMENTS - Nursing Assessment / Reassessment X - Reassessment of Co-morbidities (includes updates in patient status) 1 10 X- 1 5 Reassessment of Adherence to Treatment Plan ASSESSMENTS - Wound and Skin Assessment / Reassessment X - Simple Wound Assessment / Reassessment - one wound 1 5 '[]'$  - 0 Complex  Wound Assessment / Reassessment - multiple wounds '[]'$  - 0 Dermatologic / Skin Assessment (not related to wound area) ASSESSMENTS - Focused Assessment '[]'$  - Circumferential Edema Measurements - multi extremities 0 '[]'$  - 0 Nutritional Assessment / Counseling / Intervention '[]'$  - 0 Lower Extremity Assessment (monofilament, tuning fork, pulses) '[]'$  - 0 Peripheral Arterial Disease Assessment (using hand held doppler) ASSESSMENTS - Ostomy and/or Continence Assessment and Care '[]'$  - Incontinence Assessment and Management 0 '[]'$  - 0 Ostomy Care Assessment and Management (repouching, etc.) PROCESS - Coordination of Care X - Simple Patient / Family Education for ongoing care 1 15 '[]'$  - 0 Complex (extensive) Patient / Family Education for ongoing care X- 1 10 Staff obtains Consents, Records, Test Results / Process Orders '[]'$  - 0 Staff telephones HHA, Nursing Homes / Clarify orders / etc '[]'$  - 0 Routine Transfer to another Facility (non-emergent condition) '[]'$  - 0 Routine Hospital Admission (non-emergent condition) '[]'$  - 0 New Admissions / Biomedical engineer / Ordering NPWT, Apligraf, etc. '[]'$  - 0 Emergency Hospital Admission (emergent condition) X- 1 10 Simple Discharge Coordination '[]'$  - 0 Complex (extensive) Discharge Coordination PROCESS - Special Needs '[]'$  - Pediatric / Minor Patient Management 0 '[]'$  - 0 Isolation Patient Management '[]'$  - 0 Hearing / Language / Visual special needs '[]'$  - 0 Assessment of Community assistance (transportation, D/C planning, etc.) '[]'$  - 0 Additional assistance / Altered mentation '[]'$  - 0 Support Surface(s) Assessment (bed, cushion, seat, etc.) INTERVENTIONS - Wound Cleansing / Measurement [name], [name] ([id number]) X- 1 5 Simple Wound Cleansing - one wound '[]'$  - 0 Complex Wound Cleansing - multiple wounds X- 1 5 Wound Imaging (photographs - any number of wounds) '[]'$  - 0 Wound Tracing (instead of photographs) X- 1 5 Simple Wound Measurement - one  wound '[]'$  - 0 Complex Wound Measurement - multiple wounds INTERVENTIONS - Wound Dressings '[]'$  - Small Wound Dressing  one or multiple wounds 0 X- 1 15 Medium Wound Dressing one or multiple wounds '[]'$  - 0 Large Wound Dressing one or multiple wounds '[]'$  - 0 Application of Medications - topical '[]'$  - 0 Application of Medications - injection INTERVENTIONS - Miscellaneous '[]'$  - External ear exam 0 '[]'$  - 0 Specimen Collection (cultures, biopsies, blood, body fluids, etc.) '[]'$  - 0 Specimen(s) / Culture(s) sent or taken to Lab for analysis '[]'$  - 0 Patient Transfer (multiple staff / Civil Service fast streamer / Similar devices) '[]'$  - 0 Simple Staple / Suture removal (25 or less) '[]'$  - 0 Complex Staple / Suture removal (26 or more) '[]'$  - 0 Hypo / Hyperglycemic Management (close monitor of Blood Glucose) '[]'$  - 0 Ankle / Brachial Index (ABI) - do not check if billed separately X- 1 5 Vital Signs Has the patient been seen at the hospital within the last three years: Yes Total Score: 90 Level Of Care: New/Established - Level 3 Electronic Signature(s) Signed: 03/20/2022 5:46:57 PM By: [name], BSN, RN, CWS, Kim RN, BSN Entered By: [name] on 03/20/2022 11:24:25 [name] ([id number]) -------------------------------------------------------------------------------- Encounter Discharge Information Details Patient Name: Elizabeth Landau C. Date of Service: 03/20/2022 10:45 AM Medical Record Number: [id number] Patient Account Number: [id number] Date of Birth/Sex: [birth year] (66 y.o. F) Treating RN: [name] Primary Care [name]: [name] Other Clinician: Referring [name]: [name] Treating [name]/Extender: [name] in Treatment: 33 Encounter Discharge Information Items Discharge Condition: Stable Ambulatory Status: Cane Discharge Destination: Home Transportation: Private Auto Accompanied By: self Schedule Follow-up Appointment: Yes Clinical Summary of  Care: Electronic Signature(s) Signed: 03/20/2022 5:46:57 PM By: [name], BSN, RN, CWS, Kim RN, BSN Entered By: [name] on 03/20/2022 11:26:02 [name] ([id number]) -------------------------------------------------------------------------------- Lower Extremity Assessment Details Patient Name: [name], ROHM. Date of Service: 03/20/2022 10:45 AM Medical Record Number: [id number] Patient Account Number: [id number] Date of Birth/Sex: 19-Jan-1956 (66 y.o. F) Treating RN: [name] Primary Care Ancil Dewan: [name] Other Clinician: Referring Dayan Desa: [name] Treating Mayetta Castleman/Extender: [name] in Treatment: 33 Edema Assessment Assessed: [Left: No] [Right: No] Edema: [Left: Ye] [Right: s] Vascular Assessment Pulses: Dorsalis Pedis Palpable: [Left:Yes] Electronic Signature(s) Signed: 03/20/2022 5:46:57 PM By: [name], BSN, RN, CWS, Kim RN, BSN Entered By: [name] on 03/20/2022 11:05:37 [name] ([id number]) -------------------------------------------------------------------------------- Multi Wound Chart Details Patient Name: [name]. Date of Service: 03/20/2022 10:45 AM Medical Record Number: [id number] Patient Account Number: [id number] Date of Birth/Sex: 1955-09-21 (66 y.o. F) Treating RN: [name] Primary Care Sophia Cubero: [name] Other Clinician: Referring Christropher Gintz: [name] Treating Rondell Frick/Extender: [name] in Treatment: 33 Vital Signs Height(in): 9 Pulse(bpm): 57 Weight(lbs): 536 Blood Pressure(mmHg): 129/85 Body Mass Index(BMI): 65.9 Temperature(F): 98.2 Respiratory Rate(breaths/min): 18 Photos: [N/A:N/A] Wound Location: Left, Medial, Anterior Lower Leg N/A N/A Wounding Event: Gradually Appeared N/A N/A Primary Etiology: Lymphedema N/A N/A Comorbid History: Cataracts, Chronic sinus N/A N/A problems/congestion, Anemia, Lymphedema, Asthma, Sleep Apnea,  Tuberculosis, Angina, Hypertension, Gout, Osteoarthritis Date Acquired: 10/17/2021 N/A N/A Weeks of Treatment: 22 N/A N/A Wound Status: Open N/A N/A Wound Recurrence: No N/A N/A Measurements L x W x D (cm) 2x2x0.1 N/A N/A Area (cm) : 3.142 N/A N/A Volume (cm) : 0.314 N/A N/A % Reduction in Area: 60.00% N/A N/A % Reduction in Volume: 60.00% N/A N/A Classification: Full Thickness Without Exposed N/A N/A Support Structures Exudate Amount: Medium N/A N/A Exudate Type: Serosanguineous N/A  N/A Exudate Color: red, brown N/A N/A Wound Margin: Indistinct, nonvisible N/A N/A Granulation Amount: Medium (34-66%) N/A N/A Granulation Quality: Red N/A N/A Necrotic Amount: Small (1-33%) N/A N/A Exposed Structures: Fat Layer (Subcutaneous Tissue): N/A N/A Yes Epithelialization: Large (67-100%) N/A N/A Treatment Notes Electronic Signature(s) Signed: 03/20/2022 5:46:57 PM By: [name], BSN, RN, CWS, Kim RN, BSN Entered By: [name] on 03/20/2022 [name], [place]. ([id number]) -------------------------------------------------------------------------------- [place] Details Patient Name: [name]. Date of Service: 03/20/2022 10:45 AM Medical Record Number: [id number] Patient Account Number: [id number] Date of Birth/Sex: 03/01/1956 (66 y.o. F) Treating RN: [name] Primary Care Cherrise Occhipinti: [name] Other Clinician: Referring Kyshon Tolliver: [name] Treating Portia Wisdom/Extender: [name] in Treatment: 33 Active Inactive Wound/Skin Impairment Nursing Diagnoses: Impaired tissue integrity Knowledge deficit related to ulceration/compromised skin integrity Goals: Ulcer/skin breakdown will have a volume reduction of 30% by week 4 Date Initiated: 08/01/2021 Target Resolution Date: 08/29/2021 Goal Status: Active Ulcer/skin breakdown will have a volume reduction of 50% by week 8 Date Initiated: 08/01/2021 Target Resolution Date:  09/26/2021 Goal Status: Active Ulcer/skin breakdown will have a volume reduction of 80% by week 12 Date Initiated: 08/01/2021 Target Resolution Date: 10/24/2021 Goal Status: Active Ulcer/skin breakdown will heal within 14 weeks Date Initiated: 08/01/2021 Target Resolution Date: 11/07/2021 Goal Status: Active Interventions: Assess patient/caregiver ability to obtain necessary supplies Assess patient/caregiver ability to perform ulcer/skin care regimen upon admission and as needed Assess ulceration(s) every visit Provide education on ulcer and skin care Notes: Electronic Signature(s) Signed: 03/20/2022 5:46:57 PM By: [name], BSN, RN, CWS, Kim RN, BSN Entered By: [name] on 03/20/2022 11:05:46 [name], [name] ([id number]) -------------------------------------------------------------------------------- Pain Assessment Details Patient Name: [name]. Date of Service: 03/20/2022 10:45 AM Medical Record Number: [id number] Patient Account Number: [id number] Date of Birth/Sex: 11/16/1955 (66 y.o. F) Treating RN: [name] Primary Care Westlynn Fifer: [name] Other Clinician: Referring Janayia Burggraf: [name] Treating Alaiza Yau/Extender: [name] in Treatment: 33 Active Problems Location of Pain Severity and Description of Pain Patient Has Paino No Site Locations Pain Management and Medication Current Pain Management: Electronic Signature(s) Signed: 03/20/2022 5:46:57 PM By: [name], BSN, RN, CWS, Kim RN, BSN Entered By: [name] on 03/20/2022 10:57:18 [name] ([id number]) -------------------------------------------------------------------------------- Patient/Caregiver Education Details Patient Name: [name] Date of Service: 03/20/2022 10:45 AM Medical Record Number: [id number] Patient Account Number: [id number] Date of Birth/Gender: [birth year] (66 y.o. F) Treating RN: [name] Primary Care Physician: [name]  Other Clinician: Referring Physician: Ranae [name] Treating Physician/Extender: [name] in Treatment: 15 Education Assessment Education Provided To: Patient Education Topics Provided Wound/Skin Impairment: Handouts: Caring for Your Ulcer Methods: Demonstration, Explain/Verbal Responses: State content correctly Electronic Signature(s) Signed: 03/20/2022 5:46:57 PM By: [name], BSN, RN, CWS, Kim RN, BSN Entered By: [name] on 03/20/2022 11:25:22 [name] ([id number]) -------------------------------------------------------------------------------- Wound Assessment Details Patient Name: [name], [name] Date of Service: 03/20/2022 10:45 AM Medical Record Number: [id number] Patient Account Number: [id number] Date of Birth/Sex: 08/16/1955 (66 y.o. F) Treating RN: [name] Primary Care Norma Ignasiak: [name] Other Clinician: Referring Devlin Mcveigh: [name] Treating Tariana Moldovan/Extender: [name] in Treatment: 33 Wound Status Wound Number: 10 Primary Lymphedema Etiology: Wound Location: Left, Medial, Anterior Lower Leg Wound Open Wounding Event: Gradually Appeared Status: Date Acquired: 10/17/2021 Comorbid Cataracts, Chronic sinus problems/congestion, Anemia, Weeks Of Treatment: 22 History: Lymphedema, Asthma, Sleep Apnea,  Tuberculosis, Angina, Clustered Wound: No Hypertension, Gout, Osteoarthritis Photos Wound Measurements Length: (cm) 2 Width: (cm) 2 Depth: (cm) 0.1 Area: (cm) 3.142 Volume: (cm) 0.314 % Reduction in Area: 60% % Reduction in Volume: 60% Epithelialization: Large (67-100%) Tunneling: No Undermining: No Wound Description Classification: Full Thickness Without Exposed Support Structu Wound Margin: Indistinct, nonvisible Exudate Amount: Medium Exudate Type: Serosanguineous Exudate Color: red, brown res Foul Odor After Cleansing: No Slough/Fibrino Yes Wound Bed Granulation Amount: Medium (34-66%) Exposed  Structure Granulation Quality: Red Fat Layer (Subcutaneous Tissue) Exposed: Yes Necrotic Amount: Small (1-33%) Treatment Notes Wound #10 (Lower Leg) Wound Laterality: Left, Medial, Anterior Cleanser Soap and Water Discharge Instruction: Gently cleanse wound with antibacterial soap, rinse and pat dry prior to dressing wounds Peri-Wound Care Topical Primary Dressing [name], [name] ([id number]) SILVERCEL Antimicrobial Alginate Dressing, 1x12 (in/in) Secondary Dressing [place] Secured With ACE WRAP - 81M ACE Elastic Bandage With VELCRO Brand Closure, 4 (in) Compression Wrap Compression Stockings Add-Ons Electronic Signature(s) Signed: 03/20/2022 5:46:57 PM By: [name], BSN, RN, CWS, Kim RN, BSN Entered By: [name] on 03/20/2022 11:05:03 Andrews, [name] ([id number]) -------------------------------------------------------------------------------- Vitals Details Patient Name: [name]. Date of Service: 03/20/2022 10:45 AM Medical Record Number: [id number] Patient Account Number: [id number] Date of Birth/Sex: [birth year] (66 y.o. F) Treating RN: [name] Primary Care Medora Roorda: [name] Other Clinician: Referring Brizeida Mcmurry: [name] Treating Donielle Kaigler/Extender: [name] in Treatment: 33 Vital Signs Time Taken: 10:55 Temperature (F): 98.2 Height (in): 63 Pulse (bpm): 74 Weight (lbs): 372 Respiratory Rate (breaths/min): 18 Body Mass Index (BMI): 65.9 Blood Pressure (mmHg): 129/85 Reference Range: 80 - 120 mg / dl Airway Pulse Oximetry (%): 99 Electronic Signature(s) Signed: 03/20/2022 5:46:57 PM By: [name], BSN, RN, CWS, Kim RN, BSN Entered By: [name] on 03/20/2022 10:56:15

## 2022-03-27 ENCOUNTER — Encounter: Payer: Medicare HMO | Admitting: Physician Assistant

## 2022-03-27 DIAGNOSIS — E11621 Type 2 diabetes mellitus with foot ulcer: Secondary | ICD-10-CM | POA: Diagnosis not present

## 2022-03-27 NOTE — Progress Notes (Addendum)
MUNA, DEMERS ([id number]) Visit Report for 03/27/2022 Chief Complaint Document Details Patient Name: [name], [name]. Date of Service: 03/27/2022 9:45 AM Medical Record Number: [id number] Patient Account Number: [id number] Date of Birth/Sex: Feb 14, 1956 (66 y.o. F) Treating RN: Carlene Coria Primary Care Provider: Ranae Plumber Other Clinician: Massie Kluver Referring Provider: Ranae Plumber Treating Provider/Extender: Skipper Cliche in Treatment: 34 Information Obtained from: Patient Chief Complaint Bilateral LE lymphedema with Left LE Ulcers Electronic Signature(s) Signed: 03/27/2022 9:55:45 AM By: Worthy Keeler PA-C Entered By: Worthy Keeler on 03/27/2022 09:55:45 [name], [name] ([id number]) -------------------------------------------------------------------------------- HPI Details Patient Name: [name] Date of Service: 03/27/2022 9:45 AM Medical Record Number: [id number] Patient Account Number: [id number] Date of Birth/Sex: September 18, 1955 (66 y.o. F) Treating RN: Carlene Coria Primary Care Provider: Ranae Plumber Other Clinician: Massie Kluver Referring Provider: Ranae Plumber Treating Provider/Extender: Skipper Cliche in Treatment: 34 History of Present Illness HPI Description: The patient is a 66 year old female with history of hypertension and a long-standing history of bilateral lower extremity lymphedema (first presented on 4/2) . She has had open ulcers in the past which have always responded to compression therapy. She had briefly been to a lymphedema clinic in the past which helped her at the time. this time around she stopped treatment of her lymphedema pumps approximately 2 weeks ago because of some pain in the knees and then noticed the right leg getting worse. She was seen by her PCP who put her on clindamycin 4 times a day 2 days ago. The patient has seen [facility] and Dr. Delana Meyer had seen her last year where a vascular study including venous  and arterial duplex studies were within normal limits. he had recommended compression stockings and lymphedema pumps and the patient has been using this in about 2 weeks ago. She is known to be diabetic but in the past few time she's gone to her primary care doctor her hemoglobin A1c has been normal. 02/11/2015 - after her last visit she took my advice and went to the ER regarding the progressive cellulitis of her right lower extremity and she was admitted between July 17 and 22nd. She received IV antibiotics and then was sent home on a course of steroid-induced and oral antibiotics. She has improved much since then. 02/17/2015 -- she has been doing fine and the weeping of her legs has remarkably gone down. She has no fresh issues. READMISSION 01/15/18 This patient was given this clinic before most recently in 2016 seen by Dr. Con Memos. She has massive bilateral lymphedema and over the last 2 months this had weeping edema out of the left leg. She has compression pumps but her compliance with these has been minimal. She has [facility] they've been using TCA/ABDs/kerlix under an Ace wrap.she has had recent problems with cellulitis. She was apparently seen in the ER and 12/23/17 and given clindamycin. She was then followed by her primary doctor and given doxycycline and Keflex. The pain seems to have settled down. In April 2018 the patient had arterial studies done at [facility]. This showed triphasic waveforms throughout the right leg and mostly triphasic waveforms on the left except for monophasic at the posterior tibial artery distally. She was not felt to have evidence of right lower extremity arterial stenosis or significant problems on the left side. 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 She also had a right lower extremity venous Doppler in January 2018 this was limited  by the patient's body habitus and lymphedema. Most of the proximal veins  were not visualized The patient presents with an area of denuded skin on the anterior medial part of the left calf. There is weeping edema fluid here. 01/22/18; the patient has somewhat better edema control using her compression pumps twice a day and as a result she has much better epithelialization on the left anterior calf area. Only a small open area remains. 01/29/18; the patient has been compliant with her compression pumps. Both the areas on her calf that healed. The remaining area on the left anterior leg is fully epithelialized Readmission: 02/20/2019 upon evaluation today patient presents for reevaluation due to issues that she is having with the bilateral lower extremities. She actually has wounds open on both legs. On the right she has an area in the crease of her leg on the right around the knee region which is actually draining quite a bit and actually has some fungal type appearance to it. She has been on nystatin powder that seems to have helped to some degree. In regard to the left lower extremity this is actually in the lower portion of her leg closer to the ankle and again is continuing to drain as well unfortunately. There does not appear to be any signs of active infection at this time which is good news. No fevers, chills, nausea, vomiting, or diarrhea. She tells me that since she was seen last year she is actually been doing quite well for the most part with regard to her lower extremities. Unfortunately she now is experiencing a little bit more drainage at this time. She is concerned about getting this under control so that it does not get significantly worse. 02/27/2019 on evaluation today patient appears to be doing somewhat better in regard to her bilateral lower extremity wounds. She has been tolerating the dressing changes without complication. Fortunately there is no signs of active infection at this point. No fevers, chills, nausea, vomiting, or diarrhea. She did get her  dressing supplies which is excellent news she was extremely excited to get these. She also got paperwork from [employer] for their financial assistance program where they may be able to help her out in the future if needed with supplies at discounted prices. 03/06/2019 on evaluation today patient appears to be doing a little worse with regard to both areas of weeping on her bilateral lower extremities. This is around the right medial knee and just above the left ankle. With that being said she is unfortunately not doing as well as I would like to see. I feel like she may need to potentially go see someone at the lymphedema clinic as the wraps that she needs or even beyond what we can do here at the wound care center. She really does not have wounds she just has open areas of weeping that are causing some difficulty for her. Subsequently because of this and the moisture I am concerned about the potential for infection I am going to likely give her a prophylactic antibiotic today, Keflex, just to be on the safe side. Nonetheless again there is no obvious signs of active infection at this time. 03/13/2019 on evaluation today patient appears to be doing well with regard to her bilateral lower extremities where she has been weeping compared to even last week's evaluation. I see some areas of new skin growth which is excellent and overall I am very pleased with how things seem to be progressing. No fevers, chills, nausea,  vomiting, or diarrhea. [name], [name] ([id number]) 03/20/2019 on evaluation today patient unfortunately is continuing to have issues with significant edema of the left lower extremity. Her right side seems to be doing much better. Unfortunately her left side is showing increased weeping of the lower portion of her leg. This is quite unfortunate obviously we were hoping to get her into the lymphedema clinic they really do not seem to when I see her how if she is draining. Despite the fact this is  really not wound related but more lymphedema weeping related. Nonetheless I do not know that this can be helpful for her to even go for that appointment since again I am not sure there is much that they would actually do at this point. We may need to try a 4 layer compression wrap as best we can on her leg. She is on the Augmentin currently although I am still concerned about whether or not there could be potentially something going on infection wise I would obtain a culture though I understand is not the best being that is a surface culture I just 1 to make sure I do not seem to be missing anything. 03/27/2019 on evaluation today patient appears to be doing much better in regard to the left lower extremity compared to last week. Last week she had tremendous weeping which I think was subsequent to infection now she seems to be doing much better and very pleased. This is not completely healed but there is a lot of new skin growth and it has dried out quite a bit. Overall I think that we are doing well with how things are moving along at this time. No fevers, chills, nausea, vomiting, or diarrhea. 04/03/2019 evaluation today patient appears to be doing a little worse this week compared to last time I saw her. I think this may be due to the fact that she is having issues with not being able to sleep in her bed at least not until last night. She is therefore been in a lift chair and subsequently has also had issues with not been able to use her pumps since she could not get in bed. With that being said the patient overall seems to be doing okay I do think I may want extend the antibiotic for a little bit longer at least until we can see if her edema and her weeping gets better and if it is then obviously I can always discontinue the antibiotics as of next week however I want her to continue to have it over the next week. 04/10/2019 on evaluation today patient unfortunately is still doing poorly with regard  to her left lower extremity. Her right is all things considering doing fairly well. On the left however she continues to have spreading of the area of infection and weeping which appears to be even a larger surface area than noted last week. She did have a positive culture for Pseudomonas in particular which seems to have been of concern she still has green/yellow discharge consistent with Pseudomonas and subsequently a tremendous amount of it. This has me obviously still concerned about the infection not really clearing up despite the fact that on culture it appears the Cipro should have been a good option for treating this. I think she may at this point need IV antibiotics since things are not doing better I do not want to get worse and cause sepsis. She is in agreement with the plan and believes as well that  she likely does need to go to the hospital for IV vancomycin. Or something of the like depending on what the recommendation is from the ER. 04/17/2019 on evaluation today patient appears to be doing excellent in regard to her lower extremity on the left. She was in the hospital for several days from when I sent her last we saw her until just this past Tuesday. Fortunately her drainage is significantly improved and in fact is mostly clear. There is just a couple small areas that may still drain a little bit she states that the [facility] they prescribed for her at discharge she went picked up from pharmacy and got home but has not been able to find it since. She is looked everywhere. She is wondering if I will replace that for her today I will be more than happy to do that. 05/01/2019 on evaluation today patient actually appears to be doing quite well with regard to her lower extremities. She occasionally is having areas that will leak and then heal up mainly when a piece of the fibrotic skin pops off but fortunately she is not having any signs of active infection at this time. Overall she also  really does not have any obvious weeping at this time. I do believe however she really needs some compression wraps and I think this may be a good time to get her back to the lymphedema clinic. 05/11/2019 on evaluation today patient actually appears to be doing quite well with regard to her bilateral lower extremities. She occasionally will have a small area that we per another but in general seems to be completely healed which is great news. Overall very pleased with how everything seems to be progressing. She does have her appointment with lymphedema clinic on November 18. 05/25/2019 on evaluation today patient appears to be doing well with regard to her left lower extremity. I am very pleased in this regard. In regard to her right leg this actually did start draining more I think it is mainly due to the fact that her leg is more swollen. I am not seeing any obvious signs of infection at this time although that is definitely something were obviously acutely aware of simply due to the fact that she had an issue not too far back with exactly this issue. Nonetheless I do feel like that lymphedema clinic would still be beneficial for her. I explained obviously if they are not able to do anything treatment wise on the right leg we could at least have them treat her left leg and then proceed from there. The patient is really in agreement with that plan. If they are able to do both as the drainage slows down that I would be happy to let them handle both. 06/01/2019 on evaluation today patient unfortunately appears to be doing worse with regard to her right lower extremity. The left lower extremity is still maintaining at this point. Unfortunately she has been having significantly increased pain over the past several days and has been experiencing as well increased swelling of the right lower extremity. I really do not know that I am seeing anything that appears to be obvious for infection at this point to  be peripherally honest. With that being said the patient does seem to be having much more swelling that she is even experienced in the past and coupled with increased pain in her hip as well I am concerned that again she could potentially have a DVT although I am not 100% sure  of this. I think it something that may need to be checked out. We discussed the possibility of sending her for a DVT study through the hospital but unfortunately transportation is an issue if she does have a DVT I do not want her to wait days to be able to get in for that test however if she has this scheduled as an outpatient that is as fast that she will be able to get the test scheduled for transportation purposes. That will also fall on Thanksgiving so subsequently she did actually be looking at either Friday or even next week before we would know anything back from this. That is much too long in my opinion. Subsequent to the amount of discomfort she is experiencing the patient is actually okay with going to the ER for evaluation today. 06/12/2019 on evaluation today patient actually appears to be doing significantly better compared to last time I saw her. Following when I last saw her she was actually in the hospital from that Monday until the following Sunday almost 1 full week. She actually was placed on Keflex in the hospital following the time for her to be discharged and Dr. Steva Ready has recommended 2 times a day dosing of the Keflex for the next year in order to help with more prophylactic/preventative measures with regard to her developing cellulitis. Overall I think this sounds like an excellent plan. The patient unfortunately is good to have trouble being treated at lymphedema clinic due to the fact that she really cannot get up on the bed that they have there. They also state that they cannot manage her as long as she has anything draining at this point. Obviously that is somewhat unfortunate as she does need help  with edema control but nonetheless we will have to do what we can for her outside of it sounds like the lymphedema clinic scenario at this point. 06/19/2019 on evaluation today patient appears to be doing fairly well with regard to her bilateral lower extremities. She is not nearly as swollen and shows no signs of infection at this point. There is no evidence of cellulitis whatsoever. She also has no open wounds or draining at this point which is also good news. No fever chills noted. She seems to be in very good spirits and in fact appears to be doing quite well. READMISSION 11/27/2019 [name], [name] ([id number]) This is a 66 year old woman that we have had in this clinic several times before including 2015, 16 and 19 and then most recently from 03/20/2019 through 06/19/2019 with bilateral lower extremity lymphedema. She has had previous arterial and reflux studies done years ago which were not all that remarkable. 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 She does have a positive family history and she had large legs starting may be in her 64s. She was recently in hospital from 10/20/2019 through 10/28/2019 with right leg cellulitis. She was given Ancef and clindamycin and then Zosyn when a culture showed Pseudomonas. At that time there was purulent drainage. She was followed by infectious disease Dr Steva Ready. The patient is now back at home. She has noted increased swelling in the right and no drainage in her right leg mostly on the posterior medial aspect in the calf area. She has not had pain or fever. She has literally been improved lysing above dressings because her at the area of this is far too large for standard compression. She has been wrapping the areas  with sheets to resorptive pads. She is found these helped somewhat. She does have an appointment with the lymphedema clinic in [place] in late June. Past medical history  includes bilateral lymphedema, hypertension, obstructive sleep apnea with CPAP. Recent hospitalization with apparently Pseudomonas cellulitis of the right lower leg 12/15/2019 upon evaluation today patient appears to be doing a little bit worse in regard to her right lower extremity. Unfortunately she is having more weeping down in the lower portion of her leg. Fortunately there is no signs of active infection at this time. No fever chills noted. The patient states she is not having increased pain except for when she attempted to use the lymphedema pumps unfortunately she states that she did have pain when she did this. Otherwise we been using absorptive dressings of one type or another she is using diapers at home and then subsequently Ace wraps. In regard to the barrier cream we have discussed the possibility of derma cloud which she would like to try I do not have a problem with that. 12/22/2019 upon evaluation today patient actually appears to be doing better in regard to her leg ulcers at this point. Fortunately there does not appear to be any signs of active infection which is great news and I am extremely pleased with where things are progressing at this time. There is no sign of active infection currently. The patient is very pleased to see things doing so well. 12/29/2019 upon evaluation today patient appears to be doing a little bit better in regard to her weeping in general over her lower extremities. She does have some signs of mild erythema little bit more than what I noted last week or rather last visit. Nonetheless I think that my threshold for switching her antibiotics from Keflex to something else is very low at this point considering that she has had such severe infections in the past that seem to come almost out of nowhere. There is a little erythema and warmth noted of the lower portion of her leg compared to the upper which also makes me want to go ahead and address things more  rapidly at this point. Likely I would switch out the Keflex for something like Levaquin ideally. 7/16; patient with severe bilateral lymphedema. She has superficial wounds albeit almost circumferential now on the left lateral lower leg. This may be new from last time. Small area on the right anterior lower leg and then another area on the right medial lower leg and of pannus fold. She has been using various absorptive garments. She states she is using her compression pumps once a day occasionally twice. Culture from her last visit here was negative 01/29/2020 on evaluation today patient appears to be doing excellent at this point in regard to her legs with regard to infection I see no signs of active infection at this point. She still does have unfortunately areas of weeping this is minimal on the right now her left is actually significantly worse although I do not think it is as bad as last week with Dr. Dellia Nims saw her. She has been trying to pump and elevate her legs is much as possible. She has previously been on the Keflex and in the past for prevention that seems to do fairly well and likely can extend that today. 02/04/2020 on evaluation today patient appears to be doing better in regard to her legs bilaterally. Fortunately there is no signs of active infection at this time which is great news and overall  she has less weeping on the left compared to the right and there is several spots where she is pretty much sealed up with no draining regions. Overall very pleased in this regard. 02/19/2020 on evaluation today patient appears to be doing very well in regard to her wounds currently. Fortunately there is no evidence of active infection overall very pleased with where things stand. She is significantly improved in regard to her edema I am extremely pleased in this regard she tells me that the popping no longer hurts and in fact she actually looks forward to it. 03/04/2020 on evaluation today patient  appears to be doing excellent in regard to her lower extremities. Fortunately there is no signs of active infection at this time. No fevers, chills, nausea, vomiting, or diarrhea. 03/25/2020 on evaluation today patient appears to be doing a little bit more poorly in regard to her legs at this point. She tells me that she is still continue to have issues with drainage and this has been a little bit worse she was getting ready to start taking the Keflex again but wanted to see me first. Fortunately there is no signs of active infection at this time. No fevers, chills, nausea, vomiting, or diarrhea. 04/15/2020 upon evaluation today patient appears to be doing somewhat poorly in regard to her right leg. She tells me she has been having more pain she has been taking the Keflex that was previously prescribed unfortunately that just does not seem to help with this. She was hoping that the pain on her right was actually coming from the fact that she was having issues with her wrap having gotten caught in her recliner. With that being said she tells me that she knew something was not right. Currently her right leg is warm to touch along with being erythematous all the way up to around at least mid thigh as far as I can see. The left leg does not appear to be doing that badly though there is increased weeping around the ankle region. 05/06/2020 on evaluation today patient appears to be doing much better than last time I saw her. She did go to the hospital where she was admitted for 2 days and treated with antibiotic therapy. She was discharged with antibiotics as well and has done extremely well. I am extremely pleased with where things stand today. There is no signs of active infection at this time which is great news. 05/27/2020 upon evaluation today patient appears to be doing well with regard to her lower extremities bilaterally. She has just a very tiny area on the right leg which is opening on the left leg  she is significantly improved though she still has several areas that do appear to be open this is minimal compared to what is been in the past. In general I am extremely pleased with where things stand today. The patient does tell me she is not been using her pumps quite as much as she should be. I do believe that is 1 area she can definitely work on. She has had a lot going on including a Covid exposure and apparently also a outbreak of likely shingles. 06/24/2020 upon evaluation today patient appears to be doing well with regard to her legs in general although the left leg unfortunately is showing some signs of erythema she does have a little bit of increased weeping and to be honest I am concerned about infection here. I discussed that with her today and I think that we  may need to address this sooner rather than later she has been taking Keflex she is not really certain that is been making a big improvement however. No fevers, chills, nausea, vomiting, or diarrhea. [name], [name] ([id number]) 06/30/2020 upon evaluation today patient's legs actually seem to be doing better in my opinion as compared to where they were last week. Fortunately there does not appear to be any signs of active infection. Her culture showed multiple organisms nothing predominate. With that being said the Levaquin seems to have done well I think she has improved since I last saw her as well. 07/14/2020 upon evaluation today patient appears to be doing actually better in regard to her lower extremities in my opinion. She has been tolerating the dressing changes without complication. Fortunately there is no signs of active infection at this time. 08/04/2020 upon evaluation today patient appears to be doing excellent in regard to her leg ulcers. Fortunately she has very little that is open at this point. This is great news. In fact I think that the Gold[employer] medicated powder has been excellent for her. It seems to have done the  trick where we had tried several other things without as much success. Fortunately there is no evidence of active infection at this time. No fevers, chills, nausea, vomiting, or diarrhea. 09/01/2020 upon evaluation today patient appears to be doing a little bit more poorly than the last time I saw her. She tells me right now that she has been having a lot of drainage compared to where things were previous which has unfortunately led to more irritation as well. She is concerned that this is leading to infection. Again based on what I am seeing today as well I am also concerned of the same to be honest. 09/15/2020 upon evaluation today patient appears to be doing well with regard to her legs compared to what I saw previous. Fortunately there does not appear to be any evidence of active infection at this time which is great news. At least not as badly as what it was previous. With that being said I do believe that the patient does need to have an extension of the antibiotics. This I believe will actually help her more in the way of making sure this stays under control and does not worsen. That was the Bactrim DS. Readmission: 08/01/2021 upon evaluation today patient appears to be doing actually pretty well all things considered. Is actually been almost a year since have seen her last March. Fortunately I do not see any evidence of active infection locally nor systemically at this point. She does have a couple open areas on the left leg the right leg appears to be doing quite well. In general she has been in the hospital 3 times since I saw her a year ago all 4 cellulitis type issues except for the last 1 which was actually more related to congestive heart failure for that reason she is not using her lymphedema pumps at this point and that is probably the best idea. 08/08/2021 upon evaluation today patient appears to be doing well with regard to her legs all things considered her left leg may be a little bit  more swollen based on what I see today. Fortunately I do not see any signs of active infection locally nor systemically at this time which is great news. No fevers, chills, nausea, vomiting, or diarrhea. 08/15/2021 upon evaluation today patient actually appears to be doing excellent in regard to her wounds. She has  been tolerating the dressing changes without complication. Fortunately I see no evidence of infection currently which is great news. No fevers, chills, nausea, vomiting, or diarrhea. 08/29/2021 upon evaluation patient appears to be doing excellent she in fact is almost completely healed. I am actually very pleased with where we stand today and I think that she is making wonderful progress she just has a small area of weeping on the right lateral leg everything else is pretty much completely healed which is also. 09/05/2021 upon evaluation today patient appears to be doing well with regard to her legs. She does have a small area of weeping on the right leg and there is a small area on the left leg as well. It is potentially something that may need to be addressed in the future more specifically but right now I think that there may have just been a little drainage here on the left. With all that being said I think that this still does not appear to be infected and overall I feel like she is doing quite well. That something we always have to keep a very close eye on as she can change very rapidly from okay to not okay. 09/12/2021 upon evaluation today patient appears to be doing a little bit worse in regard to her leg and actually somewhat concerned about the possibility of infection here. I discussed that with the patient. For that reason I am going to go ahead and see about getting her set up for a repeat prescription for the Bactrim. She is in agreement with that plan. 3/14; patient presents for follow-up. She has been using silver alginate with dressing changes. She is still taking Bactrim  prescribed at last clinic visit. She has no issues or complaints today. She has lymphedema pumps however does not use these. 09/26/2020 upon evaluation today patient appears to actually be doing well in regard to her right leg I am pleased in that regard. Unfortunately she has new areas on her left leg that are open at this point that I do need to be addressed. I am also concerned about the warmth around one of the wounds on the more anterior side. I think this is something that we may need to culture today potentially requiring antibiotics going forward. 10/03/2021 upon evaluation today patient unfortunately is not doing nearly as well as she was even last week with her wounds. She is having some issues here with increased cellulitis of the left lower extremity unfortunately. I did prescribe Augmentin for her eczema prescribed last week unfortunately she never actually received this prescription. The pharmacy stated they never got it. We double checked with them today they still did not receive it. I am not sure what happened in that regard. With that being said the patient is in good spirits today she does not appear to be as sick as where she normally is when the cellulitis gets significantly worse as it has in the past. Nonetheless the leg is much more red not just around the ankle where I was seeing at last week on Tuesday but rather now this is going all the way up to almost her knee and is definitely hot to touch compared to the right leg. Nonetheless I feel like she does need to go to the ER for likely IV antibiotics. 10-10-2021 upon evaluation today patient appears to be doing well with regard to her wound. She has been tolerating the dressing changes that appears to be doing much better. After I saw  her last week she did go to the ER they admitted her to the hospital and gave her IV antibiotics she fortunately is doing significantly better. Overall I am extremely pleased with where things stand  today. 10-17-2021 upon evaluation today patient appears to be doing well with regard to her wound. In fact this is looking better and her leg is looking better but she still does have some areas here of erythema. We will continue to address this as soon as possible in my opinion. I do believe she may require some debridement and what appears to be a more defined wound on her left leg at this point. 10-24-2021 upon evaluation patient is definitely showing signs of improvement which is great news. I do not see any evidence of active infection locally or systemically which is great news as well. No fevers, chills, nausea, vomiting, or diarrhea. 10-31-2021. Upon evaluation today patient's leg actually feels better from an infection and wound care perspective. I am actually very pleased in this regard. Unfortunately the biggest issue that I see at this time is that the patient is having a significant issue with her breathing. I did put her on the pulse ox machine to check her oxygen saturation and it was pretty much at 100% which is good but she tells me that when she is walking Horkey, Callie C. ([id number]) this is much more difficult for her. She also tells me that when she is trying to bend over to perform her dressing changes she is so short of breath due to the swelling and fluid in her abdominal area that she is just not been able to do this. She is tearful and very concerned today to be honest. I am truly sorry to see her this way I know she is really having a hard time at the moment. 11-14-2021 upon evaluation patient actually appears to be doing significantly better compared to when I last saw her. She was actually admitted to the hospital I believe this for 6 days total after I sent her on 10-31-2021. Subsequently they actually pulled off greater than 50 pounds of fluid she tells me she feels significantly better her legs are not as tight she actually has some play in the tissue and it is obvious that  she is doing much better just from a mobility standpoint as well as a breathing standpoint. Overall I am extremely happy for her and how she is doing the leg also looks much better on the left. 11-21-2021 upon evaluation today patient appears to be doing well although it does appear that she is going require some sharp debridement the wound is very dry this is the opposite of what its been she was having so much weeping that it was staying extremely wet. Nonetheless we do need to see about going ahead and debriding the wound today. 11-28-2021 upon evaluation today patient appears to be doing well with regard to her wound I definitely see signs of things continuing to improve which is great news. I do not see any evidence of active infection locally or systemically also great news. 12-05-2021 upon evaluation today patient appears to be doing well with regard to her wound. She has been tolerating the dressing changes. Fortunately there does not appear to be any signs of active infection locally or systemically at this time. No fevers, chills, nausea, vomiting, or diarrhea. 12-12-2021 upon evaluation patient appears to be doing well with regard to her wound this is actually measuring smaller and looking much  better. Fortunately I do not see any signs of active infection locally or systemically at this time which is excellent news. 6/13; small wound area in the folds of her lymphedema just above the left ankle. Fortunately the area looks quite good. She has been using silver alginate ABDs and an Ace wrap which she is able to change her self. She is not using her compression pumps out of fear that this could contribute to heart failure. 12-26-2021 upon evaluation patient's wounds are actually showing signs of excellent improvement. I am very pleased with where things stand. Overall I think that she has been making excellent progress and I think we are very close to complete resolution which is great  news. 01-11-2022 upon evaluation today patient appears to be doing well currently in regard to her legs in general although on the left leg she does have 1 area that still irritated and inflamed on the anterior portion of her leg. Fortunately I do not see any signs of systemic infection locally there might be some infection going on here however which is my biggest concern. Fortunately I do not see any evidence though of this spreading to any other location. 01-16-2022 upon evaluation today patient has actually showing signs of excellent improvement. Fortunately I do not see any evidence of infection locally or systemically which is great news and overall I am very pleased with where we stand at this point. 01-25-2022 upon evaluation today patient actually appears to be doing decently well in regard to her legs. She has been tolerating the dressing changes without complication. Fortunately there does not appear to be any evidence of active infection locally or systemically which is great news and overall I am extremely pleased with where we stand at this point. 02-13-2022 upon evaluation today patient appears to be doing well currently in regard to her wound which is actually showing signs of significant improvement. Fortunately I do not see any evidence of active infection locally or systemically at this time. No fevers, chills, nausea, vomiting, or diarrhea. 02-20-2022 upon evaluation today patient appears to be doing well currently in regard to her wound. She has been tolerating the dressing changes without complication. Fortunately there does not appear to be any signs of active infection locally or systemically at this time. No fevers, chills, nausea, vomiting, or diarrhea. 02-27-2022 upon evaluation today patient appears to be doing excellent in regard to her wounds on the leg. She has been tolerating the dressing changes without complication this is very close to complete resolution. Fortunately I do  not see any signs of infection locally or systemically at this time which is great news. No fevers, chills, nausea, vomiting, or diarrhea. 03-06-2022 upon evaluation today patient appears to be doing well currently in regard to her wound in general. She has been tolerating the dressing changes without complication. Fortunately I do not see any evidence of active infection locally or systemically at this time which is great news. I am concerned a little bit however about not really her wounds but the second toe right foot where there appears to be some cellulitis after she dropped a frying pan on this about 2 weeks ago. She tells me its been itching and burning and I do think that it is possible she may have an infection based on what we are seeing currently. 03-20-2022 upon evaluation today patient actually appears to be doing much better at this point. Fortunately there does not appear to be any signs of active infection locally  or systemically at this time which is great news. No fevers, chills, nausea, vomiting, or diarrhea. 03-27-2022 upon evaluation today patient appears to be doing somewhat poorly in regard to her legs compared to previous. Her right leg has a couple areas that are open and she is very warm and painful to touch around the lateral portion of the ankle area. Left leg is showing signs of little bit more drainage and weeping as well I think that this is probably due to the fact that she is swelling a lot more that she has been in the past. Fortunately I do not see any signs of active infection systemically though locally there is definitely some issues here. Electronic Signature(s) Signed: 03/27/2022 10:37:33 AM By: Worthy Keeler PA-C Entered By: Worthy Keeler on 03/27/2022 10:37:33 Cinquemani, [name] ([id number]) -------------------------------------------------------------------------------- Physical Exam Details Patient Name: [name], [name] Date of Service: 03/27/2022 9:45  AM Medical Record Number: [id number] Patient Account Number: [id number] Date of Birth/Sex: 09/05/1955 (66 y.o. F) Treating RN: Carlene Coria Primary Care Provider: Ranae Plumber Other Clinician: Massie Kluver Referring Provider: Ranae Plumber Treating Provider/Extender: Skipper Cliche in Treatment: 68 Constitutional Well-nourished and well-hydrated in no acute distress. Respiratory normal breathing without difficulty. Psychiatric this patient is able to make decisions and demonstrates good insight into disease process. Alert and Oriented x 3. pleasant and cooperative. Notes Upon inspection patient's wound bed actually showed signs of good granulation and epithelization at this point. Fortunately there does not appear to be any signs of systemic infection at this time though locally I do see some issues here with her right ankle region. For that reason I am probably going to put her back on the doxycycline which she tends to do well with. Electronic Signature(s) Signed: 03/27/2022 10:38:04 AM By: Worthy Keeler PA-C Entered By: Worthy Keeler on 03/27/2022 10:38:04 [name] ([id number]) -------------------------------------------------------------------------------- Physician Orders Details Patient Name: [name] Date of Service: 03/27/2022 9:45 AM Medical Record Number: [id number] Patient Account Number: [id number] Date of Birth/Sex: Nov 19, 1955 (66 y.o. F) Treating RN: Carlene Coria Primary Care Provider: Ranae Plumber Other Clinician: Massie Kluver Referring Provider: Ranae Plumber Treating Provider/Extender: Skipper Cliche in Treatment: 65 Verbal / Phone Orders: No Diagnosis Coding ICD-10 Coding Code Description Q82.0 Hereditary lymphedema L97.811 Non-pressure chronic ulcer of other part of right lower leg limited to breakdown of skin L97.821 Non-pressure chronic ulcer of other part of left lower leg limited to breakdown of skin Follow-up  Appointments o Return Appointment in 1 week. o Nurse Visit as needed Bathing/ Shower/ Hygiene o Clean wound with Normal Saline or wound cleanser. o Wash wounds with antibacterial soap and water. o May shower; gently cleanse wound with antibacterial soap, rinse and pat dry prior to dressing wounds o No tub bath. Edema Control - Lymphedema / Segmental Compressive Device / Other o Ace wraps o Elevate, Exercise Daily and Avoid Standing for Long Periods of Time. o Elevate leg(s) parallel to the floor when sitting. o DO YOUR BEST to sleep in the bed at night. DO NOT sleep in your recliner. Long hours of sitting in a recliner leads to swelling of the legs and/or potential wounds on your backside. o Other: - use lymphedema pumps Non-Wound Condition Bilateral Lower Extremities o Cleanse affected area with antibacterial soap and water, o Apply appropriate compression. - ace wrap o Additional non-wound orders/instructions: - silver cell to open weeping areas with abd pad Medications-Please add to medication list. o  P.O. Antibiotics - continue doxycycline as directed Wound Treatment Wound #10 - Lower Leg Wound Laterality: Left, Medial, Anterior Cleanser: Soap and Water (Generic) Discharge Instructions: Gently cleanse wound with antibacterial soap, rinse and pat dry prior to dressing wounds Primary Dressing: SILVERCEL Antimicrobial Alginate Dressing, 1x12 (in/in) (Generic) Secondary Dressing: Chuks Secured With: ACE WRAP - 64M ACE Elastic Bandage With VELCRO Brand Closure, 4 (in) (Generic) Patient Medications Allergies: ibuprofen, ACE Inhibitors, vancomycin Notifications Medication Indication Start End doxycycline hyclate 03/27/2022 DOSE 1 - oral 100 mg capsule - 1 capsule oral taken 2 times per day for 30 days [name], [name] ([id number]) Electronic Signature(s) Signed: 03/27/2022 10:44:15 AM By: Worthy Keeler PA-C Entered By: Worthy Keeler on 03/27/2022  10:44:14 Moser, [name] ([id number]) -------------------------------------------------------------------------------- Problem List Details Patient Name: [name]. Date of Service: 03/27/2022 9:45 AM Medical Record Number: [id number] Patient Account Number: [id number] Date of Birth/Sex: 12-May-1956 (66 y.o. F) Treating RN: Carlene Coria Primary Care Provider: Ranae Plumber Other Clinician: Massie Kluver Referring Provider: Ranae Plumber Treating Provider/Extender: Skipper Cliche in Treatment: 34 Active Problems ICD-10 Encounter Code Description Active Date MDM Diagnosis Q82.0 Hereditary lymphedema 08/01/2021 No Yes L97.811 Non-pressure chronic ulcer of other part of right lower leg limited to 08/01/2021 No Yes breakdown of skin L97.821 Non-pressure chronic ulcer of other part of left lower leg limited to 08/01/2021 No Yes breakdown of skin Inactive Problems Resolved Problems Electronic Signature(s) Signed: 03/27/2022 9:55:13 AM By: Worthy Keeler PA-C Entered By: Worthy Keeler on 03/27/2022 09:55:13 Kerney, [name] ([id number]) -------------------------------------------------------------------------------- Progress Note Details Patient Name: [name]. Date of Service: 03/27/2022 9:45 AM Medical Record Number: [id number] Patient Account Number: [id number] Date of Birth/Sex: [birth year] (66 y.o. F) Treating RN: Carlene Coria Primary Care Provider: Ranae Plumber Other Clinician: Massie Kluver Referring Provider: Ranae Plumber Treating Provider/Extender: Skipper Cliche in Treatment: 34 Subjective Chief Complaint Information obtained from Patient Bilateral LE lymphedema with Left LE Ulcers History of Present Illness (HPI) The patient is a 66 year old female with history of hypertension and a long-standing history of bilateral lower extremity lymphedema (first presented on 4/2) . She has had open ulcers in the past which have always responded to  compression therapy. She had briefly been to a lymphedema clinic in the past which helped her at the time. this time around she stopped treatment of her lymphedema pumps approximately 2 weeks ago because of some pain in the knees and then noticed the right leg getting worse. She was seen by her PCP who put her on clindamycin 4 times a day 2 days ago. The patient has seen [facility] and Dr. Delana Meyer had seen her last year where a vascular study including venous and arterial duplex studies were within normal limits. he had recommended compression stockings and lymphedema pumps and the patient has been using this in about 2 weeks ago. She is known to be diabetic but in the past few time she's gone to her primary care doctor her hemoglobin A1c has been normal. 02/11/2015 - after her last visit she took my advice and went to the ER regarding the progressive cellulitis of her right lower extremity and she was admitted between July 17 and 22nd. She received IV antibiotics and then was sent home on a course of steroid-induced and oral antibiotics. She has improved much since then. 02/17/2015 -- she has been doing fine and the weeping of her legs has remarkably gone down. She has no fresh issues. READMISSION 01/15/18  This patient was given this clinic before most recently in 2016 seen by Dr. Con Memos. She has massive bilateral lymphedema and over the last 2 months this had weeping edema out of the left leg. She has compression pumps but her compliance with these has been minimal. She has [facility] they've been using TCA/ABDs/kerlix under an Ace wrap.she has had recent problems with cellulitis. She was apparently seen in the ER and 12/23/17 and given clindamycin. She was then followed by her primary doctor and given doxycycline and Keflex. The pain seems to have settled down. In April 2018 the patient had arterial studies done at [facility]. This showed triphasic waveforms throughout the right  leg and mostly triphasic waveforms on the left except for monophasic at the posterior tibial artery distally. She was not felt to have evidence of right lower extremity arterial stenosis or significant problems on the left side. 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 She also had a right lower extremity venous Doppler in January 2018 this was limited by the patient's body habitus and lymphedema. Most of the proximal veins were not visualized The patient presents with an area of denuded skin on the anterior medial part of the left calf. There is weeping edema fluid here. 01/22/18; the patient has somewhat better edema control using her compression pumps twice a day and as a result she has much better epithelialization on the left anterior calf area. Only a small open area remains. 01/29/18; the patient has been compliant with her compression pumps. Both the areas on her calf that healed. The remaining area on the left anterior leg is fully epithelialized Readmission: 02/20/2019 upon evaluation today patient presents for reevaluation due to issues that she is having with the bilateral lower extremities. She actually has wounds open on both legs. On the right she has an area in the crease of her leg on the right around the knee region which is actually draining quite a bit and actually has some fungal type appearance to it. She has been on nystatin powder that seems to have helped to some degree. In regard to the left lower extremity this is actually in the lower portion of her leg closer to the ankle and again is continuing to drain as well unfortunately. There does not appear to be any signs of active infection at this time which is good news. No fevers, chills, nausea, vomiting, or diarrhea. She tells me that since she was seen last year she is actually been doing quite well for the most part with regard to her lower extremities. Unfortunately she now is experiencing a  little bit more drainage at this time. She is concerned about getting this under control so that it does not get significantly worse. 02/27/2019 on evaluation today patient appears to be doing somewhat better in regard to her bilateral lower extremity wounds. She has been tolerating the dressing changes without complication. Fortunately there is no signs of active infection at this point. No fevers, chills, nausea, vomiting, or diarrhea. She did get her dressing supplies which is excellent news she was extremely excited to get these. She also got paperwork from [employer] for their financial assistance program where they may be able to help her out in the future if needed with supplies at discounted prices. 03/06/2019 on evaluation today patient appears to be doing a little worse with regard to both areas of weeping on her bilateral lower extremities. This is around the right medial  knee and just above the left ankle. With that being said she is unfortunately not doing as well as I would like to see. I feel like she may need to potentially go see someone at the lymphedema clinic as the wraps that she needs or even beyond what we can do here at the wound care center. She really does not have wounds she just has open areas of weeping that are causing some difficulty for her. Subsequently because of this and the moisture I am concerned about the potential for infection I am going to likely give her a prophylactic antibiotic today, Keflex, just to be on the safe side. Nonetheless again there is no obvious signs of active infection at this time. [name], [name] ([id number]) 03/13/2019 on evaluation today patient appears to be doing well with regard to her bilateral lower extremities where she has been weeping compared to even last week's evaluation. I see some areas of new skin growth which is excellent and overall I am very pleased with how things seem to be progressing. No fevers, chills, nausea, vomiting, or  diarrhea. 03/20/2019 on evaluation today patient unfortunately is continuing to have issues with significant edema of the left lower extremity. Her right side seems to be doing much better. Unfortunately her left side is showing increased weeping of the lower portion of her leg. This is quite unfortunate obviously we were hoping to get her into the lymphedema clinic they really do not seem to when I see her how if she is draining. Despite the fact this is really not wound related but more lymphedema weeping related. Nonetheless I do not know that this can be helpful for her to even go for that appointment since again I am not sure there is much that they would actually do at this point. We may need to try a 4 layer compression wrap as best we can on her leg. She is on the Augmentin currently although I am still concerned about whether or not there could be potentially something going on infection wise I would obtain a culture though I understand is not the best being that is a surface culture I just 1 to make sure I do not seem to be missing anything. 03/27/2019 on evaluation today patient appears to be doing much better in regard to the left lower extremity compared to last week. Last week she had tremendous weeping which I think was subsequent to infection now she seems to be doing much better and very pleased. This is not completely healed but there is a lot of new skin growth and it has dried out quite a bit. Overall I think that we are doing well with how things are moving along at this time. No fevers, chills, nausea, vomiting, or diarrhea. 04/03/2019 evaluation today patient appears to be doing a little worse this week compared to last time I saw her. I think this may be due to the fact that she is having issues with not being able to sleep in her bed at least not until last night. She is therefore been in a lift chair and subsequently has also had issues with not been able to use her pumps since  she could not get in bed. With that being said the patient overall seems to be doing okay I do think I may want extend the antibiotic for a little bit longer at least until we can see if her edema and her weeping gets better and if it is then  obviously I can always discontinue the antibiotics as of next week however I want her to continue to have it over the next week. 04/10/2019 on evaluation today patient unfortunately is still doing poorly with regard to her left lower extremity. Her right is all things considering doing fairly well. On the left however she continues to have spreading of the area of infection and weeping which appears to be even a larger surface area than noted last week. She did have a positive culture for Pseudomonas in particular which seems to have been of concern she still has green/yellow discharge consistent with Pseudomonas and subsequently a tremendous amount of it. This has me obviously still concerned about the infection not really clearing up despite the fact that on culture it appears the Cipro should have been a good option for treating this. I think she may at this point need IV antibiotics since things are not doing better I do not want to get worse and cause sepsis. She is in agreement with the plan and believes as well that she likely does need to go to the hospital for IV vancomycin. Or something of the like depending on what the recommendation is from the ER. 04/17/2019 on evaluation today patient appears to be doing excellent in regard to her lower extremity on the left. She was in the hospital for several days from when I sent her last we saw her until just this past Tuesday. Fortunately her drainage is significantly improved and in fact is mostly clear. There is just a couple small areas that may still drain a little bit she states that the [facility] they prescribed for her at discharge she went picked up from pharmacy and got home but has not been able to find  it since. She is looked everywhere. She is wondering if I will replace that for her today I will be more than happy to do that. 05/01/2019 on evaluation today patient actually appears to be doing quite well with regard to her lower extremities. She occasionally is having areas that will leak and then heal up mainly when a piece of the fibrotic skin pops off but fortunately she is not having any signs of active infection at this time. Overall she also really does not have any obvious weeping at this time. I do believe however she really needs some compression wraps and I think this may be a good time to get her back to the lymphedema clinic. 05/11/2019 on evaluation today patient actually appears to be doing quite well with regard to her bilateral lower extremities. She occasionally will have a small area that we per another but in general seems to be completely healed which is great news. Overall very pleased with how everything seems to be progressing. She does have her appointment with lymphedema clinic on November 18. 05/25/2019 on evaluation today patient appears to be doing well with regard to her left lower extremity. I am very pleased in this regard. In regard to her right leg this actually did start draining more I think it is mainly due to the fact that her leg is more swollen. I am not seeing any obvious signs of infection at this time although that is definitely something were obviously acutely aware of simply due to the fact that she had an issue not too far back with exactly this issue. Nonetheless I do feel like that lymphedema clinic would still be beneficial for her. I explained obviously if they are not able to do  anything treatment wise on the right leg we could at least have them treat her left leg and then proceed from there. The patient is really in agreement with that plan. If they are able to do both as the drainage slows down that I would be happy to let them handle  both. 06/01/2019 on evaluation today patient unfortunately appears to be doing worse with regard to her right lower extremity. The left lower extremity is still maintaining at this point. Unfortunately she has been having significantly increased pain over the past several days and has been experiencing as well increased swelling of the right lower extremity. I really do not know that I am seeing anything that appears to be obvious for infection at this point to be peripherally honest. With that being said the patient does seem to be having much more swelling that she is even experienced in the past and coupled with increased pain in her hip as well I am concerned that again she could potentially have a DVT although I am not 100% sure of this. I think it something that may need to be checked out. We discussed the possibility of sending her for a DVT study through the hospital but unfortunately transportation is an issue if she does have a DVT I do not want her to wait days to be able to get in for that test however if she has this scheduled as an outpatient that is as fast that she will be able to get the test scheduled for transportation purposes. That will also fall on Thanksgiving so subsequently she did actually be looking at either Friday or even next week before we would know anything back from this. That is much too long in my opinion. Subsequent to the amount of discomfort she is experiencing the patient is actually okay with going to the ER for evaluation today. 06/12/2019 on evaluation today patient actually appears to be doing significantly better compared to last time I saw her. Following when I last saw her she was actually in the hospital from that Monday until the following Sunday almost 1 full week. She actually was placed on Keflex in the hospital following the time for her to be discharged and Dr. Steva Ready has recommended 2 times a day dosing of the Keflex for the next year in order  to help with more prophylactic/preventative measures with regard to her developing cellulitis. Overall I think this sounds like an excellent plan. The patient unfortunately is good to have trouble being treated at lymphedema clinic due to the fact that she really cannot get up on the bed that they have there. They also state that they cannot manage her as long as she has anything draining at this point. Obviously that is somewhat unfortunate as she does need help with edema control but nonetheless we will have to do what we can for her outside of it sounds like the lymphedema clinic scenario at this point. 06/19/2019 on evaluation today patient appears to be doing fairly well with regard to her bilateral lower extremities. She is not nearly as swollen [name], [name] ([id number]) and shows no signs of infection at this point. There is no evidence of cellulitis whatsoever. She also has no open wounds or draining at this point which is also good news. No fever chills noted. She seems to be in very good spirits and in fact appears to be doing quite well. READMISSION 11/27/2019 This is a 66 year old woman that we have had in this  clinic several times before including 2015, 16 and 19 and then most recently from 03/20/2019 through 06/19/2019 with bilateral lower extremity lymphedema. She has had previous arterial and reflux studies done years ago which were not all that remarkable. 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 She does have a positive family history and she had large legs starting may be in her 35s. She was recently in hospital from 10/20/2019 through 10/28/2019 with right leg cellulitis. She was given Ancef and clindamycin and then Zosyn when a culture showed Pseudomonas. At that time there was purulent drainage. She was followed by infectious disease Dr Steva Ready. The patient is now back at home. She has noted increased swelling in the  right and no drainage in her right leg mostly on the posterior medial aspect in the calf area. She has not had pain or fever. She has literally been improved lysing above dressings because her at the area of this is far too large for standard compression. She has been wrapping the areas with sheets to resorptive pads. She is found these helped somewhat. She does have an appointment with the lymphedema clinic in [place] in late June. Past medical history includes bilateral lymphedema, hypertension, obstructive sleep apnea with CPAP. Recent hospitalization with apparently Pseudomonas cellulitis of the right lower leg 12/15/2019 upon evaluation today patient appears to be doing a little bit worse in regard to her right lower extremity. Unfortunately she is having more weeping down in the lower portion of her leg. Fortunately there is no signs of active infection at this time. No fever chills noted. The patient states she is not having increased pain except for when she attempted to use the lymphedema pumps unfortunately she states that she did have pain when she did this. Otherwise we been using absorptive dressings of one type or another she is using diapers at home and then subsequently Ace wraps. In regard to the barrier cream we have discussed the possibility of derma cloud which she would like to try I do not have a problem with that. 12/22/2019 upon evaluation today patient actually appears to be doing better in regard to her leg ulcers at this point. Fortunately there does not appear to be any signs of active infection which is great news and I am extremely pleased with where things are progressing at this time. There is no sign of active infection currently. The patient is very pleased to see things doing so well. 12/29/2019 upon evaluation today patient appears to be doing a little bit better in regard to her weeping in general over her lower extremities. She does have some signs of mild  erythema little bit more than what I noted last week or rather last visit. Nonetheless I think that my threshold for switching her antibiotics from Keflex to something else is very low at this point considering that she has had such severe infections in the past that seem to come almost out of nowhere. There is a little erythema and warmth noted of the lower portion of her leg compared to the upper which also makes me want to go ahead and address things more rapidly at this point. Likely I would switch out the Keflex for something like Levaquin ideally. 7/16; patient with severe bilateral lymphedema. She has superficial wounds albeit almost circumferential now on the left lateral lower leg. This may be new from last time. Small area on the right anterior lower leg and then another  area on the right medial lower leg and of pannus fold. She has been using various absorptive garments. She states she is using her compression pumps once a day occasionally twice. Culture from her last visit here was negative 01/29/2020 on evaluation today patient appears to be doing excellent at this point in regard to her legs with regard to infection I see no signs of active infection at this point. She still does have unfortunately areas of weeping this is minimal on the right now her left is actually significantly worse although I do not think it is as bad as last week with Dr. Dellia Nims saw her. She has been trying to pump and elevate her legs is much as possible. She has previously been on the Keflex and in the past for prevention that seems to do fairly well and likely can extend that today. 02/04/2020 on evaluation today patient appears to be doing better in regard to her legs bilaterally. Fortunately there is no signs of active infection at this time which is great news and overall she has less weeping on the left compared to the right and there is several spots where she is pretty much sealed up with no draining  regions. Overall very pleased in this regard. 02/19/2020 on evaluation today patient appears to be doing very well in regard to her wounds currently. Fortunately there is no evidence of active infection overall very pleased with where things stand. She is significantly improved in regard to her edema I am extremely pleased in this regard she tells me that the popping no longer hurts and in fact she actually looks forward to it. 03/04/2020 on evaluation today patient appears to be doing excellent in regard to her lower extremities. Fortunately there is no signs of active infection at this time. No fevers, chills, nausea, vomiting, or diarrhea. 03/25/2020 on evaluation today patient appears to be doing a little bit more poorly in regard to her legs at this point. She tells me that she is still continue to have issues with drainage and this has been a little bit worse she was getting ready to start taking the Keflex again but wanted to see me first. Fortunately there is no signs of active infection at this time. No fevers, chills, nausea, vomiting, or diarrhea. 04/15/2020 upon evaluation today patient appears to be doing somewhat poorly in regard to her right leg. She tells me she has been having more pain she has been taking the Keflex that was previously prescribed unfortunately that just does not seem to help with this. She was hoping that the pain on her right was actually coming from the fact that she was having issues with her wrap having gotten caught in her recliner. With that being said she tells me that she knew something was not right. Currently her right leg is warm to touch along with being erythematous all the way up to around at least mid thigh as far as I can see. The left leg does not appear to be doing that badly though there is increased weeping around the ankle region. 05/06/2020 on evaluation today patient appears to be doing much better than last time I saw her. She did go to the  hospital where she was admitted for 2 days and treated with antibiotic therapy. She was discharged with antibiotics as well and has done extremely well. I am extremely pleased with where things stand today. There is no signs of active infection at this time which is great news.  05/27/2020 upon evaluation today patient appears to be doing well with regard to her lower extremities bilaterally. She has just a very tiny area on the right leg which is opening on the left leg she is significantly improved though she still has several areas that do appear to be open this is minimal compared to what is been in the past. In general I am extremely pleased with where things stand today. The patient does tell me she is not been using her pumps quite as much as she should be. I do believe that is 1 area she can definitely work on. She has had a lot going on including a Covid exposure and apparently also a outbreak of likely shingles. [name], [name] ([id number]) 06/24/2020 upon evaluation today patient appears to be doing well with regard to her legs in general although the left leg unfortunately is showing some signs of erythema she does have a little bit of increased weeping and to be honest I am concerned about infection here. I discussed that with her today and I think that we may need to address this sooner rather than later she has been taking Keflex she is not really certain that is been making a big improvement however. No fevers, chills, nausea, vomiting, or diarrhea. 06/30/2020 upon evaluation today patient's legs actually seem to be doing better in my opinion as compared to where they were last week. Fortunately there does not appear to be any signs of active infection. Her culture showed multiple organisms nothing predominate. With that being said the Levaquin seems to have done well I think she has improved since I last saw her as well. 07/14/2020 upon evaluation today patient appears to be doing  actually better in regard to her lower extremities in my opinion. She has been tolerating the dressing changes without complication. Fortunately there is no signs of active infection at this time. 08/04/2020 upon evaluation today patient appears to be doing excellent in regard to her leg ulcers. Fortunately she has very little that is open at this point. This is great news. In fact I think that the Goldbond medicated powder has been excellent for her. It seems to have done the trick where we had tried several other things without as much success. Fortunately there is no evidence of active infection at this time. No fevers, chills, nausea, vomiting, or diarrhea. 09/01/2020 upon evaluation today patient appears to be doing a little bit more poorly than the last time I saw her. She tells me right now that she has been having a lot of drainage compared to where things were previous which has unfortunately led to more irritation as well. She is concerned that this is leading to infection. Again based on what I am seeing today as well I am also concerned of the same to be honest. 09/15/2020 upon evaluation today patient appears to be doing well with regard to her legs compared to what I saw previous. Fortunately there does not appear to be any evidence of active infection at this time which is great news. At least not as badly as what it was previous. With that being said I do believe that the patient does need to have an extension of the antibiotics. This I believe will actually help her more in the way of making sure this stays under control and does not worsen. That was the Bactrim DS. Readmission: 08/01/2021 upon evaluation today patient appears to be doing actually pretty well all things considered. Is actually been  almost a year since have seen her last March. Fortunately I do not see any evidence of active infection locally nor systemically at this point. She does have a couple open areas on the left  leg the right leg appears to be doing quite well. In general she has been in the hospital 3 times since I saw her a year ago all 4 cellulitis type issues except for the last 1 which was actually more related to congestive heart failure for that reason she is not using her lymphedema pumps at this point and that is probably the best idea. 08/08/2021 upon evaluation today patient appears to be doing well with regard to her legs all things considered her left leg may be a little bit more swollen based on what I see today. Fortunately I do not see any signs of active infection locally nor systemically at this time which is great news. No fevers, chills, nausea, vomiting, or diarrhea. 08/15/2021 upon evaluation today patient actually appears to be doing excellent in regard to her wounds. She has been tolerating the dressing changes without complication. Fortunately I see no evidence of infection currently which is great news. No fevers, chills, nausea, vomiting, or diarrhea. 08/29/2021 upon evaluation patient appears to be doing excellent she in fact is almost completely healed. I am actually very pleased with where we stand today and I think that she is making wonderful progress she just has a small area of weeping on the right lateral leg everything else is pretty much completely healed which is also. 09/05/2021 upon evaluation today patient appears to be doing well with regard to her legs. She does have a small area of weeping on the right leg and there is a small area on the left leg as well. It is potentially something that may need to be addressed in the future more specifically but right now I think that there may have just been a little drainage here on the left. With all that being said I think that this still does not appear to be infected and overall I feel like she is doing quite well. That something we always have to keep a very close eye on as she can change very rapidly from okay to not  okay. 09/12/2021 upon evaluation today patient appears to be doing a little bit worse in regard to her leg and actually somewhat concerned about the possibility of infection here. I discussed that with the patient. For that reason I am going to go ahead and see about getting her set up for a repeat prescription for the Bactrim. She is in agreement with that plan. 3/14; patient presents for follow-up. She has been using silver alginate with dressing changes. She is still taking Bactrim prescribed at last clinic visit. She has no issues or complaints today. She has lymphedema pumps however does not use these. 09/26/2020 upon evaluation today patient appears to actually be doing well in regard to her right leg I am pleased in that regard. Unfortunately she has new areas on her left leg that are open at this point that I do need to be addressed. I am also concerned about the warmth around one of the wounds on the more anterior side. I think this is something that we may need to culture today potentially requiring antibiotics going forward. 10/03/2021 upon evaluation today patient unfortunately is not doing nearly as well as she was even last week with her wounds. She is having some issues here with increased cellulitis  of the left lower extremity unfortunately. I did prescribe Augmentin for her eczema prescribed last week unfortunately she never actually received this prescription. The pharmacy stated they never got it. We double checked with them today they still did not receive it. I am not sure what happened in that regard. With that being said the patient is in good spirits today she does not appear to be as sick as where she normally is when the cellulitis gets significantly worse as it has in the past. Nonetheless the leg is much more red not just around the ankle where I was seeing at last week on Tuesday but rather now this is going all the way up to almost her knee and is definitely hot to touch  compared to the right leg. Nonetheless I feel like she does need to go to the ER for likely IV antibiotics. 10-10-2021 upon evaluation today patient appears to be doing well with regard to her wound. She has been tolerating the dressing changes that appears to be doing much better. After I saw her last week she did go to the ER they admitted her to the hospital and gave her IV antibiotics she fortunately is doing significantly better. Overall I am extremely pleased with where things stand today. 10-17-2021 upon evaluation today patient appears to be doing well with regard to her wound. In fact this is looking better and her leg is looking better but she still does have some areas here of erythema. We will continue to address this as soon as possible in my opinion. I do believe she may require some debridement and what appears to be a more defined wound on her left leg at this point. 10-24-2021 upon evaluation patient is definitely showing signs of improvement which is great news. I do not see any evidence of active infection [place]. ([id number]) locally or systemically which is great news as well. No fevers, chills, nausea, vomiting, or diarrhea. 10-31-2021. Upon evaluation today patient's leg actually feels better from an infection and wound care perspective. I am actually very pleased in this regard. Unfortunately the biggest issue that I see at this time is that the patient is having a significant issue with her breathing. I did put her on the pulse ox machine to check her oxygen saturation and it was pretty much at 100% which is good but she tells me that when she is walking this is much more difficult for her. She also tells me that when she is trying to bend over to perform her dressing changes she is so short of breath due to the swelling and fluid in her abdominal area that she is just not been able to do this. She is tearful and very concerned today to be honest. I am truly sorry to see  her this way I know she is really having a hard time at the moment. 11-14-2021 upon evaluation patient actually appears to be doing significantly better compared to when I last saw her. She was actually admitted to the hospital I believe this for 6 days total after I sent her on 10-31-2021. Subsequently they actually pulled off greater than 50 pounds of fluid she tells me she feels significantly better her legs are not as tight she actually has some play in the tissue and it is obvious that she is doing much better just from a mobility standpoint as well as a breathing standpoint. Overall I am extremely happy for her and how she is doing the  leg also looks much better on the left. 11-21-2021 upon evaluation today patient appears to be doing well although it does appear that she is going require some sharp debridement the wound is very dry this is the opposite of what its been she was having so much weeping that it was staying extremely wet. Nonetheless we do need to see about going ahead and debriding the wound today. 11-28-2021 upon evaluation today patient appears to be doing well with regard to her wound I definitely see signs of things continuing to improve which is great news. I do not see any evidence of active infection locally or systemically also great news. 12-05-2021 upon evaluation today patient appears to be doing well with regard to her wound. She has been tolerating the dressing changes. Fortunately there does not appear to be any signs of active infection locally or systemically at this time. No fevers, chills, nausea, vomiting, or diarrhea. 12-12-2021 upon evaluation patient appears to be doing well with regard to her wound this is actually measuring smaller and looking much better. Fortunately I do not see any signs of active infection locally or systemically at this time which is excellent news. 6/13; small wound area in the folds of her lymphedema just above the left ankle. Fortunately  the area looks quite good. She has been using silver alginate ABDs and an Ace wrap which she is able to change her self. She is not using her compression pumps out of fear that this could contribute to heart failure. 12-26-2021 upon evaluation patient's wounds are actually showing signs of excellent improvement. I am very pleased with where things stand. Overall I think that she has been making excellent progress and I think we are very close to complete resolution which is great news. 01-11-2022 upon evaluation today patient appears to be doing well currently in regard to her legs in general although on the left leg she does have 1 area that still irritated and inflamed on the anterior portion of her leg. Fortunately I do not see any signs of systemic infection locally there might be some infection going on here however which is my biggest concern. Fortunately I do not see any evidence though of this spreading to any other location. 01-16-2022 upon evaluation today patient has actually showing signs of excellent improvement. Fortunately I do not see any evidence of infection locally or systemically which is great news and overall I am very pleased with where we stand at this point. 01-25-2022 upon evaluation today patient actually appears to be doing decently well in regard to her legs. She has been tolerating the dressing changes without complication. Fortunately there does not appear to be any evidence of active infection locally or systemically which is great news and overall I am extremely pleased with where we stand at this point. 02-13-2022 upon evaluation today patient appears to be doing well currently in regard to her wound which is actually showing signs of significant improvement. Fortunately I do not see any evidence of active infection locally or systemically at this time. No fevers, chills, nausea, vomiting, or diarrhea. 02-20-2022 upon evaluation today patient appears to be doing well  currently in regard to her wound. She has been tolerating the dressing changes without complication. Fortunately there does not appear to be any signs of active infection locally or systemically at this time. No fevers, chills, nausea, vomiting, or diarrhea. 02-27-2022 upon evaluation today patient appears to be doing excellent in regard to her wounds on the leg. She  has been tolerating the dressing changes without complication this is very close to complete resolution. Fortunately I do not see any signs of infection locally or systemically at this time which is great news. No fevers, chills, nausea, vomiting, or diarrhea. 03-06-2022 upon evaluation today patient appears to be doing well currently in regard to her wound in general. She has been tolerating the dressing changes without complication. Fortunately I do not see any evidence of active infection locally or systemically at this time which is great news. I am concerned a little bit however about not really her wounds but the second toe right foot where there appears to be some cellulitis after she dropped a frying pan on this about 2 weeks ago. She tells me its been itching and burning and I do think that it is possible she may have an infection based on what we are seeing currently. 03-20-2022 upon evaluation today patient actually appears to be doing much better at this point. Fortunately there does not appear to be any signs of active infection locally or systemically at this time which is great news. No fevers, chills, nausea, vomiting, or diarrhea. 03-27-2022 upon evaluation today patient appears to be doing somewhat poorly in regard to her legs compared to previous. Her right leg has a couple areas that are open and she is very warm and painful to touch around the lateral portion of the ankle area. Left leg is showing signs of little bit more drainage and weeping as well I think that this is probably due to the fact that she is swelling a lot  more that she has been in the past. Fortunately I do not see any signs of active infection systemically though locally there is definitely some issues here. [name], [name] ([id number]) Objective Constitutional Well-nourished and well-hydrated in no acute distress. Vitals Time Taken: 9:52 AM, Height: 63 in, Weight: 372 lbs, BMI: 65.9, Temperature: 97.9 F, Pulse: 73 bpm, Respiratory Rate: 18 breaths/min, Blood Pressure: 114/77 mmHg. Respiratory normal breathing without difficulty. Psychiatric this patient is able to make decisions and demonstrates good insight into disease process. Alert and Oriented x 3. pleasant and cooperative. General Notes: Upon inspection patient's wound bed actually showed signs of good granulation and epithelization at this point. Fortunately there does not appear to be any signs of systemic infection at this time though locally I do see some issues here with her right ankle region. For that reason I am probably going to put her back on the doxycycline which she tends to do well with. Integumentary (Hair, Skin) Wound #10 status is Open. Original cause of wound was Gradually Appeared. The date acquired was: 10/17/2021. The wound has been in treatment 23 weeks. The wound is located on the Left,Medial,Anterior Lower Leg. The wound measures 1.5cm length x 0.7cm width x 0.1cm depth; 0.825cm^2 area and 0.082cm^3 volume. There is Fat Layer (Subcutaneous Tissue) exposed. There is a medium amount of serosanguineous drainage noted. The wound margin is indistinct and nonvisible. There is medium (34-66%) red granulation within the wound bed. There is a small (1-33%) amount of necrotic tissue within the wound bed. 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 Nurse Visit as needed Bathing/ Shower/ Hygiene: Clean wound with Normal Saline or wound cleanser. Wash wounds with antibacterial soap  and water. May shower; gently cleanse wound with antibacterial soap, rinse and pat dry prior to dressing wounds No tub bath. Edema Control - Lymphedema / Segmental Compressive Device / Other: Ace wraps Elevate, Exercise Daily and Avoid Standing for Long Periods of Time. Elevate leg(s) parallel to the floor when sitting. DO YOUR BEST to sleep in the bed at night. DO NOT sleep in your recliner. Long hours of sitting in a recliner leads to swelling of the legs and/or potential wounds on your backside. Other: - use lymphedema pumps Non-Wound Condition: Cleanse affected area with antibacterial soap and water, Apply appropriate compression. - ace wrap Additional non-wound orders/instructions: - silver cell to open weeping areas with abd pad Medications-Please add to medication list.: P.O. Antibiotics - continue doxycycline as directed The following medication(s) was prescribed: doxycycline hyclate oral 100 mg capsule 1 1 capsule oral taken 2 times per day for 30 days starting 03/27/2022 WOUND #10: - Lower Leg Wound Laterality: Left, Medial, Anterior Cleanser: Soap and Water (Generic) [name], [name] ([id number]) Discharge Instructions: Gently cleanse wound with antibacterial soap, rinse and pat dry prior to dressing wounds Primary Dressing: SILVERCEL Antimicrobial Alginate Dressing, 1x12 (in/in) (Generic) Secondary Dressing: [place] Secured With: ACE WRAP - 62M ACE Elastic Bandage With VELCRO Brand Closure, 4 (in) (Generic) 1. Based on what I am seeing currently I am going to suggest that we go ahead and initiate a continuation of treatment with the silver alginate to the open areas I think that still the primary and best way to go. 2. I am also going to recommend that we have the patient continue to monitor for any  signs of worsening or infection. If anything changes she should contact the office and let me know. I did send in a prescription for doxycycline for the patient currently. We will see patient back for reevaluation in 1 week here in the clinic. If anything worsens or changes patient will contact our office for additional recommendations. Electronic Signature(s) Signed: 03/27/2022 10:44:41 AM By: Worthy Keeler PA-C Entered By: Worthy Keeler on 03/27/2022 10:44:41 Knutzen, [name] ([id number]) -------------------------------------------------------------------------------- SuperBill Details Patient Name: [name] Date of Service: 03/27/2022 Medical Record Number: [id number] Patient Account Number: [id number] Date of Birth/Sex: 11-May-1956 (66 y.o. F) Treating RN: Carlene Coria Primary Care Provider: Ranae Plumber Other Clinician: Massie Kluver Referring Provider: Ranae Plumber Treating Provider/Extender: Skipper Cliche in Treatment: 34 Diagnosis Coding ICD-10 Codes Code Description Q82.0 Hereditary lymphedema L97.811 Non-pressure chronic ulcer of other part of right lower leg limited to breakdown of skin L97.821 Non-pressure chronic ulcer of other part of left lower leg limited to breakdown of skin Facility Procedures CPT4 Code: [id number] Description: 99213 - WOUND CARE VISIT-LEV 3 EST PT Modifier: Quantity: 1 Physician Procedures CPT4 Code: 1224825 Description: 00370 - WC PHYS LEVEL 4 - EST PT Modifier: Quantity: 1 CPT4 Code: Description: ICD-10 Diagnosis Description Q82.0 Hereditary lymphedema L97.811 Non-pressure chronic ulcer of other part of right lower leg limited to bre L97.821 Non-pressure chronic ulcer of other part of left lower leg limited to brea Modifier: akdown of skin kdown of skin Quantity: Electronic Signature(s) Signed: 03/27/2022 10:45:15 AM By: Worthy Keeler PA-C Entered By: Worthy Keeler on 03/27/2022 10:45:15

## 2022-03-30 NOTE — Progress Notes (Signed)
[name], [name] ([id number]) Visit Report for 03/27/2022 Arrival Information Details Patient Name: [name], [name]. Date of Service: 03/27/2022 9:45 AM Medical Record Number: [id number] Patient Account Number: [id number] Date of Birth/Sex: 11-14-1955 (66 y.o. F) Treating RN: [name] Primary Care [name]: [name] Other Clinician: Massie [name] Referring [name]: [name] Treating [name]/Extender: [name] in Treatment: 55 Visit Information History Since Last Visit All ordered tests and consults were completed: No Patient Arrived: [name] Added or deleted any medications: No Arrival Time: 09:46 Any new allergies or adverse reactions: No Transfer Assistance: None Had a fall or experienced change in No Patient Has Alerts: Yes activities of daily living that may affect Patient Alerts: Borderline Diabetic risk of falls: Hospitalized since last visit: No Pain Present Now: Yes Electronic Signature(s) Signed: 03/29/2022 11:41:41 AM By: [name] Entered By: [name] on 03/27/2022 09:50:43 [place], [name] ([id number]) -------------------------------------------------------------------------------- Clinic Level of Care Assessment Details Patient Name: [name]. Date of Service: 03/27/2022 9:45 AM Medical Record Number: [id number] Patient Account Number: [id number] Date of Birth/Sex: [birth year] (66 y.o. F) Treating RN: [name] Primary Care Daaron Dimarco: [name] Other Clinician: Massie [name] Referring Jamilex Bohnsack: [name] Treating Kharizma Lesnick/Extender: [name] in Treatment: 34 Clinic Level of Care Assessment Items TOOL 4 Quantity Score '[]'$  - Use when only an EandM is performed on FOLLOW-UP visit 0 ASSESSMENTS - Nursing Assessment / Reassessment X - Reassessment of Co-morbidities (includes updates in patient status) 1 10 X- 1 5 Reassessment of Adherence to Treatment Plan ASSESSMENTS - Wound and Skin Assessment /  Reassessment X - Simple Wound Assessment / Reassessment - one wound 1 5 '[]'$  - 0 Complex Wound Assessment / Reassessment - multiple wounds '[]'$  - 0 Dermatologic / Skin Assessment (not related to wound area) ASSESSMENTS - Focused Assessment '[]'$  - Circumferential Edema Measurements - multi extremities 0 '[]'$  - 0 Nutritional Assessment / Counseling / Intervention '[]'$  - 0 Lower Extremity Assessment (monofilament, tuning fork, pulses) '[]'$  - 0 Peripheral Arterial Disease Assessment (using hand held doppler) ASSESSMENTS - Ostomy and/or Continence Assessment and Care '[]'$  - Incontinence Assessment and Management 0 '[]'$  - 0 Ostomy Care Assessment and Management (repouching, etc.) PROCESS - Coordination of Care X - Simple Patient / Family Education for ongoing care 1 15 '[]'$  - 0 Complex (extensive) Patient / Family Education for ongoing care '[]'$  - 0 Staff obtains Programmer, systems, Records, Test Results / Process Orders '[]'$  - 0 Staff telephones HHA, Nursing Homes / Clarify orders / etc '[]'$  - 0 Routine Transfer to another Facility (non-emergent condition) '[]'$  - 0 Routine Hospital Admission (non-emergent condition) '[]'$  - 0 New Admissions / Biomedical engineer / Ordering NPWT, Apligraf, etc. '[]'$  - 0 Emergency Hospital Admission (emergent condition) X- 1 10 Simple Discharge Coordination '[]'$  - 0 Complex (extensive) Discharge Coordination PROCESS - Special Needs '[]'$  - Pediatric / Minor Patient Management 0 '[]'$  - 0 Isolation Patient Management '[]'$  - 0 Hearing / Language / Visual special needs '[]'$  - 0 Assessment of Community assistance (transportation, D/C planning, etc.) '[]'$  - 0 Additional assistance / Altered mentation '[]'$  - 0 Support Surface(s) Assessment (bed, cushion, seat, etc.) INTERVENTIONS - Wound Cleansing / Measurement Wisecup, Ambre C. ([id number]) X- 1 5 Simple Wound Cleansing - one wound '[]'$  - 0 Complex Wound Cleansing - multiple wounds X- 1 5 Wound Imaging (photographs - any number of wounds) '[]'$   - 0 Wound Tracing (instead of photographs) X- 1 5 Simple Wound Measurement - one wound '[]'$  - 0  Complex Wound Measurement - multiple wounds INTERVENTIONS - Wound Dressings '[]'$  - Small Wound Dressing one or multiple wounds 0 X- 1 15 Medium Wound Dressing one or multiple wounds '[]'$  - 0 Large Wound Dressing one or multiple wounds '[]'$  - 0 Application of Medications - topical '[]'$  - 0 Application of Medications - injection INTERVENTIONS - Miscellaneous '[]'$  - External ear exam 0 '[]'$  - 0 Specimen Collection (cultures, biopsies, blood, body fluids, etc.) '[]'$  - 0 Specimen(s) / Culture(s) sent or taken to Lab for analysis '[]'$  - 0 Patient Transfer (multiple staff / Civil Service fast streamer / Similar devices) '[]'$  - 0 Simple Staple / Suture removal (25 or less) '[]'$  - 0 Complex Staple / Suture removal (26 or more) '[]'$  - 0 Hypo / Hyperglycemic Management (close monitor of Blood Glucose) '[]'$  - 0 Ankle / Brachial Index (ABI) - do not check if billed separately X- 1 5 Vital Signs Has the patient been seen at the hospital within the last three years: Yes Total Score: 80 Level Of Care: New/Established - Level 3 Electronic Signature(s) Signed: 03/29/2022 11:41:41 AM By: [name] Entered By: [name] on 03/27/2022 10:30:14 [name] ([id number]) -------------------------------------------------------------------------------- Encounter Discharge Information Details Patient Name: [name]. Date of Service: 03/27/2022 9:45 AM Medical Record Number: [id number] Patient Account Number: [id number] Date of Birth/Sex: 1956/01/19 (66 y.o. F) Treating RN: [name] Primary Care Banita Lehn: [name] Other Clinician: Massie [name] Referring Galileo Colello: [name] Treating Wyat Infinger/Extender: [name] in Treatment: 34 Encounter Discharge Information Items Discharge Condition: Stable Ambulatory Status: Walker Discharge Destination: Home Transportation: Private Auto Accompanied By:  self Schedule Follow-up Appointment: Yes Clinical Summary of Care: Electronic Signature(s) Signed: 03/29/2022 11:41:41 AM By: [name] Entered By: [name] on 03/27/2022 10:45:23 Van[place]esen, [name] ([id number]) -------------------------------------------------------------------------------- Lower Extremity Assessment Details Patient Name: [name]. Date of Service: 03/27/2022 9:45 AM Medical Record Number: [id number] Patient Account Number: [id number] Date of Birth/Sex: 1956-05-07 (66 y.o. F) Treating RN: [name] Primary Care Zalaya Astarita: [name] Other Clinician: Massie [name] Referring Keyonna Comunale: [name] Treating Gilberte Gorley/Extender: Jeri Cos Weeks in Treatment: 34 Electronic Signature(s) Signed: 03/29/2022 11:41:41 AM By: [name] Signed: 03/30/2022 9:44:45 AM By: Elizabeth Coria RN Entered By: [name] on 03/27/2022 10:12:00 [name] ([id number]) -------------------------------------------------------------------------------- Multi Wound Chart Details Patient Name: [name], [name] Date of Service: 03/27/2022 9:45 AM Medical Record Number: [id number] Patient Account Number: [id number] Date of Birth/Sex: 03-26-1956 (66 y.o. F) Treating RN: [name] Primary Care Ariyanah Aguado: [name] Other Clinician: Massie [name] Referring Alonah Lineback: [name] Treating Darrious Youman/Extender: [name] in Treatment: 34 Vital Signs Height(in): 30 Pulse(bpm): 3 Weight(lbs): 67 Blood Pressure(mmHg): 114/77 Body Mass Index(BMI): 65.9 Temperature(F): 97.9 Respiratory Rate(breaths/min): 18 Photos: [N/A:N/A] Wound Location: Left, Medial, Anterior Lower Leg N/A N/A Wounding Event: Gradually Appeared N/A N/A Primary Etiology: Lymphedema N/A N/A Comorbid History: Cataracts, Chronic sinus N/A N/A problems/congestion, Anemia, Lymphedema, Asthma, Sleep Apnea, Tuberculosis, Angina, Hypertension, Gout, Osteoarthritis Date  Acquired: 10/17/2021 N/A N/A Weeks of Treatment: 23 N/A N/A Wound Status: Open N/A N/A Wound Recurrence: No N/A N/A Measurements L x W x D (cm) 1.5x0.7x0.1 N/A N/A Area (cm) : 0.825 N/A N/A Volume (cm) : 0.082 N/A N/A % Reduction in Area: 89.50% N/A N/A % Reduction in Volume: 89.60% N/A N/A Classification: Full Thickness Without Exposed N/A N/A Support Structures Exudate Amount: Medium N/A N/A Exudate Type: Serosanguineous N/A N/A Exudate Color: red, brown N/A N/A Wound Margin: Indistinct, nonvisible N/A N/A Granulation  Amount: Medium (34-66%) N/A N/A Granulation Quality: Red N/A N/A Necrotic Amount: Small (1-33%) N/A N/A Exposed Structures: Fat Layer (Subcutaneous Tissue): N/A N/A Yes Epithelialization: Large (67-100%) N/A N/A Treatment Notes Electronic Signature(s) Signed: 03/29/2022 11:41:41 AM By: [name] Entered By: [name] on 03/27/2022 10:12:33 [name] ([id number]) -------------------------------------------------------------------------------- [place] Details Patient Name: [name]. Date of Service: 03/27/2022 9:45 AM Medical Record Number: [id number] Patient Account Number: [id number] Date of Birth/Sex: 02-09-1956 (66 y.o. F) Treating RN: [name] Primary Care Korah Hufstedler: [name] Other Clinician: Massie [name] Referring Lyndsay Talamante: [name] Treating Wave Calzada/Extender: [name] in Treatment: 34 Active Inactive Wound/Skin Impairment Nursing Diagnoses: Impaired tissue integrity Knowledge deficit related to ulceration/compromised skin integrity Goals: Ulcer/skin breakdown will have a volume reduction of 30% by week 4 Date Initiated: 08/01/2021 Target Resolution Date: 08/29/2021 Goal Status: Active Ulcer/skin breakdown will have a volume reduction of 50% by week 8 Date Initiated: 08/01/2021 Target Resolution Date: 09/26/2021 Goal Status: Active Ulcer/skin breakdown will have a volume  reduction of 80% by week 12 Date Initiated: 08/01/2021 Target Resolution Date: 10/24/2021 Goal Status: Active Ulcer/skin breakdown will heal within 14 weeks Date Initiated: 08/01/2021 Target Resolution Date: 11/07/2021 Goal Status: Active Interventions: Assess patient/caregiver ability to obtain necessary supplies Assess patient/caregiver ability to perform ulcer/skin care regimen upon admission and as needed Assess ulceration(s) every visit Provide education on ulcer and skin care Notes: Electronic Signature(s) Signed: 03/29/2022 11:41:41 AM By: [name] Signed: 03/30/2022 9:44:45 AM By: Elizabeth Coria RN Entered By: [name] on 03/27/2022 10:12:04 [name] ([id number]) -------------------------------------------------------------------------------- Pain Assessment Details Patient Name: Elizabeth Landau C. Date of Service: 03/27/2022 9:45 AM Medical Record Number: [id number] Patient Account Number: [id number] Date of Birth/Sex: [birth year] (66 y.o. F) Treating RN: [name] Primary Care Iliana Hutt: [name] Other Clinician: Massie [name] Referring Kemoni Ortega: [name] Treating Finnegan Gatta/Extender: [name] in Treatment: 34 Active Problems Location of Pain Severity and Description of Pain Patient Has Paino Yes Site Locations Pain Location: Generalized Pain, Pain in Ulcers Rate the pain. Current Pain Level: 9 Character of Pain Describe the Pain: Aching, Throbbing Pain Management and Medication Current Pain Management: Rest: Yes Cold Application: Yes Notes right medial ankle painful to walk Electronic Signature(s) Signed: 03/29/2022 11:41:41 AM By: [name] Signed: 03/30/2022 9:44:45 AM By: Elizabeth Coria RN Entered By: [name] on 03/27/2022 09:57:43 Butz, [name] ([id number]) -------------------------------------------------------------------------------- Patient/Caregiver Education Details Patient Name: [name]. Date of Service: 03/27/2022 9:45 AM Medical Record Number: [id number] Patient Account Number: [id number] Date of Birth/Gender: 1955-07-22 (66 y.o. F) Treating RN: [name] Primary Care Physician: [name] Other Clinician: Massie [name] Referring Physician: Ranae [name] Treating Physician/Extender: [name] in Treatment: 29 Education Assessment Education Provided To: Patient Education Topics Provided Wound/Skin Impairment: Handouts: Other: continue wound care as directed Methods: Explain/Verbal Responses: State content correctly Electronic Signature(s) Signed: 03/29/2022 11:41:41 AM By: [name] Entered By: [name] on 03/27/2022 10:30:36 [name], [name] ([id number]) -------------------------------------------------------------------------------- Wound Assessment Details Patient Name: Elizabeth Landau C. Date of Service: 03/27/2022 9:45 AM Medical Record Number: [id number] Patient Account Number: [id number] Date of Birth/Sex: 06-29-1956 (66 y.o. F) Treating RN: [name] Primary Care Zarahi Fuerst: [name] Other Clinician: Massie [name] Referring Jawann Urbani: [name] Treating Nile Prisk/Extender: [name] in Treatment: 34 Wound Status Wound Number: 10 Primary Lymphedema Etiology: Wound Location: Left, Medial, Anterior Lower Leg Wound Open Wounding Event: Gradually Appeared Status: Date Acquired: 10/17/2021 Comorbid Cataracts,  Chronic sinus problems/congestion, Anemia, Weeks Of Treatment: 23 History: Lymphedema, Asthma, Sleep Apnea, Tuberculosis, Angina, Clustered Wound: No Hypertension, Gout, Osteoarthritis Photos Wound Measurements Length: (cm) 1.5 Width: (cm) 0.7 Depth: (cm) 0.1 Area: (cm) 0.825 Volume: (cm) 0.082 % Reduction in Area: 89.5% % Reduction in Volume: 89.6% Epithelialization: Large (67-100%) Wound Description Classification: Full Thickness Without Exposed Support Structu Wound Margin:  Indistinct, nonvisible Exudate Amount: Medium Exudate Type: Serosanguineous Exudate Color: red, brown res Foul Odor After Cleansing: No Slough/Fibrino Yes Wound Bed Granulation Amount: Medium (34-66%) Exposed Structure Granulation Quality: Red Fat Layer (Subcutaneous Tissue) Exposed: Yes Necrotic Amount: Small (1-33%) Treatment Notes Wound #10 (Lower Leg) Wound Laterality: Left, Medial, Anterior Cleanser Soap and Water Discharge Instruction: Gently cleanse wound with antibacterial soap, rinse and pat dry prior to dressing wounds Peri-Wound Care Topical Primary Dressing RANEEM, MENDOLIA ([id number]) SILVERCEL Antimicrobial Alginate Dressing, 1x12 (in/in) Secondary Dressing Chuks Secured With ACE WRAP - 11M ACE Elastic Bandage With VELCRO Brand Closure, 4 (in) Compression Wrap Compression Stockings Add-Ons Electronic Signature(s) Signed: 03/29/2022 11:41:41 AM By: [name] Signed: 03/30/2022 9:44:45 AM By: Elizabeth Coria RN Entered By: [name] on 03/27/2022 10:11:18 Sass, [name] ([id number]) -------------------------------------------------------------------------------- Vitals Details Patient Name: [name]. Date of Service: 03/27/2022 9:45 AM Medical Record Number: [id number] Patient Account Number: [id number] Date of Birth/Sex: 1956/04/17 (66 y.o. F) Treating RN: [name] Primary Care Maureen Delatte: [name] Other Clinician: Massie [name] Referring Leeloo Silverthorne: [name] Treating Kieffer Blatz/Extender: [name] in Treatment: 34 Vital Signs Time Taken: 09:52 Temperature (F): 97.9 Height (in): 63 Pulse (bpm): 73 Weight (lbs): 372 Respiratory Rate (breaths/min): 18 Body Mass Index (BMI): 65.9 Blood Pressure (mmHg): 114/77 Reference Range: 80 - 120 mg / dl Electronic Signature(s) Signed: 03/29/2022 11:41:41 AM By: [name] Entered By: [name] on 03/27/2022 09:57:38

## 2022-04-02 NOTE — Progress Notes (Deleted)
Patient ID: [name], female    DOB: 01/31/1956, 66 y.o.   MRN: [id number]  HPI  [name] is a 66 y/o female with a history of asthma, HTN, arthritis, lymphedema, sleep apnea, spinal stenosis, obesity, previous tobacco use and chronic heart failure.   Echo report from 11/01/21 reviewed and showed an EF of 60-65%.   Admitted 10/31/21 due to worsening shortness of breath, edema and orthopnea. Initially given IV lasix with transition to oral diuretics with addition of twice weekly metolazone. Cardiology consult obtained. Discharged after 6 days.   She presents today for a follow-up visit with a chief complaint of   She says that earlier today she was diagnosed with cellulitis of her right 2nd toe and will be starting an antibiotic later today.   She says that she hasn't been following her diet as closely as before and has been eating cornbread and "lots of carbs".   She says that sometimes her furosemide works better than at other times. Tried torsemide once before with subsequent weight gain.   Past Medical History:  Diagnosis Date   Arthritis    Asthma    CHF (congestive heart failure) ([place])    Enlarged heart    Hypertension    Lymphedema    Sleep apnea    Spinal stenosis    Past Surgical History:  Procedure Laterality Date   COLONOSCOPY WITH PROPOFOL N/A 03/12/2018   Procedure: COLONOSCOPY WITH PROPOFOL;  Surgeon: Manya Silvas, MD;  Location: [facility] ENDOSCOPY;  Service: Endoscopy;  Laterality: N/A;   NO PAST SURGERIES     Family History  Problem Relation Age of Onset   Thyroid disease Other    Breast cancer Maternal Aunt      Allergies  Allergen Reactions   Ace Inhibitors Hives and Swelling   Ibuprofen Hives and Swelling   Vancomycin Itching and Nausea And Vomiting    Review of Systems  Constitutional:  Positive for fatigue (fluctuates). Negative for appetite change.  HENT:  Negative for congestion, postnasal drip and sore throat.   Eyes: Negative.    Respiratory:  Positive for shortness of breath and wheezing (at times). Negative for cough and chest tightness.   Cardiovascular:  Positive for palpitations and leg swelling (lymphedema). Negative for chest pain.  Gastrointestinal:  Negative for abdominal distention, abdominal pain, constipation and diarrhea.  Endocrine: Negative.   Genitourinary: Negative.   Musculoskeletal:  Negative for back pain.  Skin: Negative.        Severe lymphedema  Allergic/Immunologic: Negative.   Neurological:  Negative for dizziness and light-headedness.  Hematological:  Negative for adenopathy. Does not bruise/bleed easily.  Psychiatric/Behavioral:  Positive for sleep disturbance (chronic trouble falling asleep; sleeps in recliner). Negative for dysphoric mood. The patient is nervous/anxious.      Physical Exam Vitals and nursing note reviewed.  Constitutional:      Appearance: She is well-developed.  HENT:     Head: Normocephalic and atraumatic.  Cardiovascular:     Rate and Rhythm: Normal rate and regular rhythm.  Pulmonary:     Effort: Pulmonary effort is normal. No accessory muscle usage.     Breath sounds: No wheezing, rhonchi or rales.  Abdominal:     Palpations: Abdomen is soft.     Tenderness: There is no abdominal tenderness.  Musculoskeletal:     Cervical back: Normal range of motion and neck supple.     Right lower leg: Edema present.     Left lower leg: Edema  present.     Comments: Severe lymphedema  Skin:    General: Skin is warm and dry.  Neurological:     General: No focal deficit present.     Mental Status: She is alert and oriented to person, place, and time.  Psychiatric:        Mood and Affect: Mood is not depressed.        Behavior: Behavior normal.   Assessment & Plan:  1: Chronic heart failure with preserved ejection fraction without structural changes- - NYHA class III - euvolemic today - attempting to weigh daily with standing on 2 scales but it's been  fluctuating quite a bit; she's going to look into finding a wide enough scale for her to stand on - weight up 8397.8 pounds from last visit here 1 month ago - limiting her sodium intake but occasionally adds "a pinch" of salt to meats; does not like Mrs [name], garlic powder etc - understands to keep daily fluid intake to 60-64 ounces/ day - facial/mouth swelling with ACEi - saw cardiology [name]) 03/19/22 - will check BMP since bumex started at last visit - she says that she's tried torsemide in the past but then had subsequent weight gain - BNP 10/31/21 was 123.9  2: HTN with stage 3 CKD- - BP  - sees PCP [name]) @ [facility] and she has to call and schedule an appointment - nephrology referral had been made but their office said they didn't receive the referral so a referral was faxed to their office today - BMP 01/25/22 reviewed and showed sodium 141, potassium 3.6, creatinine 1.69 and GFR 33  3: Lymphedema- - went to wound center earlier today - she feels like lymphedema may be a little worse - changing diuretic per above  4: Sleep apnea- - wearing CPAP nightly   Medication list reviewed.

## 2022-04-03 ENCOUNTER — Encounter: Payer: Medicare HMO | Admitting: Physician Assistant

## 2022-04-03 ENCOUNTER — Ambulatory Visit: Payer: Medicare HMO | Admitting: Family

## 2022-04-03 ENCOUNTER — Other Ambulatory Visit: Payer: Self-pay

## 2022-04-03 ENCOUNTER — Observation Stay: Payer: Medicare Other

## 2022-04-03 ENCOUNTER — Inpatient Hospital Stay
Admission: EM | Admit: 2022-04-03 | Discharge: 2022-04-07 | DRG: 603 | Disposition: A | Discharge status: Home Health | Payer: Medicare Other | Source: Ambulatory Visit | Attending: Internal Medicine | Admitting: Internal Medicine

## 2022-04-03 DIAGNOSIS — Z720 Tobacco use: Secondary | ICD-10-CM | POA: Diagnosis present

## 2022-04-03 DIAGNOSIS — I890 Lymphedema, not elsewhere classified: Secondary | ICD-10-CM | POA: Diagnosis not present

## 2022-04-03 DIAGNOSIS — Z881 Allergy status to other antibiotic agents status: Secondary | ICD-10-CM

## 2022-04-03 DIAGNOSIS — I5032 Chronic diastolic (congestive) heart failure: Secondary | ICD-10-CM | POA: Diagnosis not present

## 2022-04-03 DIAGNOSIS — E876 Hypokalemia: Secondary | ICD-10-CM | POA: Diagnosis not present

## 2022-04-03 DIAGNOSIS — I10 Essential (primary) hypertension: Secondary | ICD-10-CM | POA: Diagnosis not present

## 2022-04-03 DIAGNOSIS — G4733 Obstructive sleep apnea (adult) (pediatric): Secondary | ICD-10-CM | POA: Diagnosis present

## 2022-04-03 DIAGNOSIS — S81011A Laceration without foreign body, right knee, initial encounter: Secondary | ICD-10-CM | POA: Diagnosis not present

## 2022-04-03 DIAGNOSIS — Z6841 Body Mass Index (BMI) 40.0 and over, adult: Secondary | ICD-10-CM | POA: Diagnosis not present

## 2022-04-03 DIAGNOSIS — Z79899 Other long term (current) drug therapy: Secondary | ICD-10-CM

## 2022-04-03 DIAGNOSIS — L03115 Cellulitis of right lower limb: Secondary | ICD-10-CM | POA: Diagnosis not present

## 2022-04-03 DIAGNOSIS — N1831 Chronic kidney disease, stage 3a: Secondary | ICD-10-CM | POA: Diagnosis not present

## 2022-04-03 DIAGNOSIS — F17290 Nicotine dependence, other tobacco product, uncomplicated: Secondary | ICD-10-CM | POA: Diagnosis not present

## 2022-04-03 DIAGNOSIS — L03119 Cellulitis of unspecified part of limb: Secondary | ICD-10-CM | POA: Diagnosis not present

## 2022-04-03 DIAGNOSIS — I130 Hypertensive heart and chronic kidney disease with heart failure and stage 1 through stage 4 chronic kidney disease, or unspecified chronic kidney disease: Secondary | ICD-10-CM | POA: Diagnosis present

## 2022-04-03 DIAGNOSIS — L0390 Cellulitis, unspecified: Secondary | ICD-10-CM | POA: Diagnosis present

## 2022-04-03 DIAGNOSIS — Z888 Allergy status to other drugs, medicaments and biological substances status: Secondary | ICD-10-CM

## 2022-04-03 DIAGNOSIS — I959 Hypotension, unspecified: Secondary | ICD-10-CM | POA: Diagnosis not present

## 2022-04-03 DIAGNOSIS — M79604 Pain in right leg: Secondary | ICD-10-CM | POA: Diagnosis present

## 2022-04-03 DIAGNOSIS — Z803 Family history of malignant neoplasm of breast: Secondary | ICD-10-CM

## 2022-04-03 DIAGNOSIS — M79605 Pain in left leg: Secondary | ICD-10-CM | POA: Diagnosis present

## 2022-04-03 DIAGNOSIS — X58XXXA Exposure to other specified factors, initial encounter: Secondary | ICD-10-CM | POA: Diagnosis present

## 2022-04-03 LAB — CBC WITH DIFFERENTIAL/PLATELET
Abs Immature Granulocytes: 0.04 10*3/uL (ref 0.00–0.07)
Basophils Absolute: 0 10*3/uL (ref 0.0–0.1)
Basophils Relative: 0 %
Eosinophils Absolute: 0 10*3/uL (ref 0.0–0.5)
Eosinophils Relative: 0 %
HCT: 36 % (ref 36.0–46.0)
Hemoglobin: 11.1 g/dL — ABNORMAL LOW (ref 12.0–15.0)
Immature Granulocytes: 1 %
Lymphocytes Relative: 22 %
Lymphs Abs: 1.5 10*3/uL (ref 0.7–4.0)
MCH: 26 pg (ref 26.0–34.0)
MCHC: 30.8 g/dL (ref 30.0–36.0)
MCV: 84.3 fL (ref 80.0–100.0)
Monocytes Absolute: 0.8 10*3/uL (ref 0.1–1.0)
Monocytes Relative: 12 %
Neutro Abs: 4.5 10*3/uL (ref 1.7–7.7)
Neutrophils Relative %: 65 %
Platelets: 172 10*3/uL (ref 150–400)
RBC: 4.27 MIL/uL (ref 3.87–5.11)
RDW: 14.2 % (ref 11.5–15.5)
WBC: 7 10*3/uL (ref 4.0–10.5)
nRBC: 0 % (ref 0.0–0.2)

## 2022-04-03 LAB — URINALYSIS, ROUTINE W REFLEX MICROSCOPIC
Bilirubin Urine: NEGATIVE
Glucose, UA: NEGATIVE mg/dL
Hgb urine dipstick: NEGATIVE
Ketones, ur: NEGATIVE mg/dL
Leukocytes,Ua: NEGATIVE
Nitrite: NEGATIVE
Protein, ur: NEGATIVE mg/dL
Specific Gravity, Urine: 1.006 (ref 1.005–1.030)
pH: 6 (ref 5.0–8.0)

## 2022-04-03 LAB — MAGNESIUM: Magnesium: 1.7 mg/dL (ref 1.7–2.4)

## 2022-04-03 LAB — COMPREHENSIVE METABOLIC PANEL
ALT: 11 U/L (ref 0–44)
AST: 18 U/L (ref 15–41)
Albumin: 3.4 g/dL — ABNORMAL LOW (ref 3.5–5.0)
Alkaline Phosphatase: 108 U/L (ref 38–126)
Anion gap: 13 (ref 5–15)
BUN: 41 mg/dL — ABNORMAL HIGH (ref 8–23)
CO2: 27 mmol/L (ref 22–32)
Calcium: 8.6 mg/dL — ABNORMAL LOW (ref 8.9–10.3)
Chloride: 98 mmol/L (ref 98–111)
Creatinine, Ser: 1.48 mg/dL — ABNORMAL HIGH (ref 0.44–1.00)
GFR, Estimated: 39 mL/min — ABNORMAL LOW (ref >60)
Glucose, Bld: 101 mg/dL — ABNORMAL HIGH (ref 70–99)
Potassium: 3.4 mmol/L — ABNORMAL LOW (ref 3.5–5.1)
Sodium: 138 mmol/L (ref 135–145)
Total Bilirubin: 0.9 mg/dL (ref 0.3–1.2)
Total Protein: 8.6 g/dL — ABNORMAL HIGH (ref 6.5–8.1)

## 2022-04-03 LAB — LACTIC ACID, PLASMA
Lactic Acid, Venous: 1.1 mmol/L (ref 0.5–1.9)
Lactic Acid, Venous: 1.5 mmol/L (ref 0.5–1.9)

## 2022-04-03 LAB — PHOSPHORUS: Phosphorus: 3.6 mg/dL (ref 2.5–4.6)

## 2022-04-03 MED ORDER — ACETAMINOPHEN 325 MG PO TABS
650.0000 mg | ORAL_TABLET | Freq: Four times a day (QID) | ORAL | Status: DC | PRN
  Administered 2022-04-04 – 2022-04-07 (×6): 650 mg via ORAL
  Filled 2022-04-03 (×6): qty 2

## 2022-04-03 MED ORDER — NYSTATIN 100000 UNIT/GM EX POWD: Freq: Two times a day (BID) | CUTANEOUS | Status: DC | PRN

## 2022-04-03 MED ORDER — ATENOLOL 25 MG PO TABS: 25.0000 mg | ORAL_TABLET | Freq: Every day | ORAL | Status: DC

## 2022-04-03 MED ORDER — MORPHINE SULFATE (PF) 4 MG/ML IV SOLN
4.0000 mg | INTRAVENOUS | Status: DC | PRN
  Filled 2022-04-03: qty 1

## 2022-04-03 MED ORDER — PREGABALIN 50 MG PO CAPS
100.0000 mg | ORAL_CAPSULE | Freq: Two times a day (BID) | ORAL | Status: DC
  Administered 2022-04-03 – 2022-04-07 (×8): 100 mg via ORAL
  Filled 2022-04-03 (×8): qty 2

## 2022-04-03 MED ORDER — ENOXAPARIN SODIUM 40 MG/0.4ML IJ SOSY
40.0000 mg | PREFILLED_SYRINGE | INTRAMUSCULAR | Status: DC
  Administered 2022-04-03: 40 mg via SUBCUTANEOUS
  Filled 2022-04-03: qty 0.4

## 2022-04-03 MED ORDER — VITAMIN B-12 1000 MCG PO TABS
500.0000 ug | ORAL_TABLET | Freq: Every day | ORAL | Status: DC
  Administered 2022-04-03 – 2022-04-07 (×5): 500 ug via ORAL
  Filled 2022-04-03 (×6): qty 1

## 2022-04-03 MED ORDER — DIPHENHYDRAMINE HCL 50 MG/ML IJ SOLN
25.0000 mg | Freq: Once | INTRAMUSCULAR | Status: AC
  Administered 2022-04-03: 25 mg via INTRAVENOUS
  Filled 2022-04-03: qty 1

## 2022-04-03 MED ORDER — SODIUM CHLORIDE 0.9 % IV SOLN
2.0000 g | Freq: Three times a day (TID) | INTRAVENOUS | Status: DC
  Administered 2022-04-03 – 2022-04-04 (×3): 2 g via INTRAVENOUS
  Filled 2022-04-03 (×2): qty 2
  Filled 2022-04-03 (×2): qty 12.5

## 2022-04-03 MED ORDER — VANCOMYCIN HCL 1250 MG/250ML IV SOLN
1250.0000 mg | INTRAVENOUS | Status: DC
  Administered 2022-04-04 – 2022-04-05 (×2): 1250 mg via INTRAVENOUS
  Filled 2022-04-03 (×2): qty 250

## 2022-04-03 MED ORDER — BUMETANIDE 1 MG PO TABS
2.0000 mg | ORAL_TABLET | Freq: Every day | ORAL | Status: DC
  Administered 2022-04-03 – 2022-04-04 (×2): 2 mg via ORAL
  Filled 2022-04-03 (×4): qty 2

## 2022-04-03 MED ORDER — POLYETHYLENE GLYCOL 3350 17 G PO PACK
17.0000 g | PACK | Freq: Every day | ORAL | Status: DC | PRN
  Administered 2022-04-04: 17 g via ORAL
  Filled 2022-04-03: qty 1

## 2022-04-03 MED ORDER — DIPHENHYDRAMINE HCL 50 MG/ML IJ SOLN
12.5000 mg | Freq: Four times a day (QID) | INTRAMUSCULAR | Status: DC | PRN
  Administered 2022-04-03: 12.5 mg via INTRAVENOUS
  Filled 2022-04-03: qty 1

## 2022-04-03 MED ORDER — SENNOSIDES-DOCUSATE SODIUM 8.6-50 MG PO TABS
1.0000 | ORAL_TABLET | Freq: Every day | ORAL | Status: DC
  Administered 2022-04-03 – 2022-04-07 (×5): 1 via ORAL
  Filled 2022-04-03 (×5): qty 1

## 2022-04-03 MED ORDER — VANCOMYCIN HCL 2000 MG/400ML IV SOLN
2000.0000 mg | Freq: Once | INTRAVENOUS | Status: AC
  Administered 2022-04-03: 2000 mg via INTRAVENOUS
  Filled 2022-04-03: qty 400

## 2022-04-03 MED ORDER — LABETALOL HCL 5 MG/ML IV SOLN: 5.0000 mg | INTRAVENOUS | Status: DC | PRN

## 2022-04-03 MED ORDER — POTASSIUM CHLORIDE CRYS ER 20 MEQ PO TBCR
40.0000 meq | EXTENDED_RELEASE_TABLET | Freq: Every day | ORAL | Status: DC
  Administered 2022-04-03: 40 meq via ORAL
  Filled 2022-04-03: qty 2

## 2022-04-03 MED ORDER — OXYCODONE-ACETAMINOPHEN 5-325 MG PO TABS
1.0000 | ORAL_TABLET | Freq: Four times a day (QID) | ORAL | Status: AC | PRN
  Administered 2022-04-03 – 2022-04-05 (×4): 1 via ORAL
  Filled 2022-04-03 (×4): qty 1

## 2022-04-03 MED ORDER — ONDANSETRON HCL 4 MG/2ML IJ SOLN: 4.0000 mg | Freq: Four times a day (QID) | INTRAMUSCULAR | Status: DC | PRN

## 2022-04-03 MED ORDER — ONDANSETRON HCL 4 MG PO TABS: 4.0000 mg | ORAL_TABLET | Freq: Four times a day (QID) | ORAL | Status: DC | PRN

## 2022-04-03 MED ORDER — FAMOTIDINE IN NACL 20-0.9 MG/50ML-% IV SOLN
20.0000 mg | Freq: Once | INTRAVENOUS | Status: AC
  Administered 2022-04-03: 20 mg via INTRAVENOUS
  Filled 2022-04-03: qty 50

## 2022-04-03 MED ORDER — SENNOSIDES-DOCUSATE SODIUM 8.6-50 MG PO TABS: 1.0000 | ORAL_TABLET | Freq: Every evening | ORAL | Status: DC | PRN

## 2022-04-03 MED ORDER — ACETAMINOPHEN 650 MG RE SUPP: 650.0000 mg | Freq: Four times a day (QID) | RECTAL | Status: DC | PRN

## 2022-04-03 MED ORDER — ENOXAPARIN SODIUM 100 MG/ML IJ SOSY
0.5000 mg/kg | PREFILLED_SYRINGE | INTRAMUSCULAR | Status: DC
  Administered 2022-04-04 – 2022-04-06 (×3): 87.5 mg via SUBCUTANEOUS
  Filled 2022-04-03 (×3): qty 0.88

## 2022-04-03 NOTE — Assessment & Plan Note (Signed)
-   Given that patient endorses the pain is worse compared to March 2023, I have ordered bilateral lower extremity ultrasound to assess for DVT

## 2022-04-03 NOTE — ED Provider Notes (Signed)
[facility] Provider Note    Event Date/Time   First MD Initiated Contact with Patient 04/03/22 1228     (approximate)   History   Leg Swelling   HPI  [name] is a 66 y.o. female with chronic severe lymphedema bilateral lower extremities and recurrent wounds and cellulitis presents to the ER from wound care clinic for admission for IV antibiotics due to failure of outpatient therapy for cellulitis.  She is having right greater than left weeping drainage from several ulcerations.  No measured fevers or chills.  Has been on Doxy without improvement over the past week.     Physical Exam   Triage Vital Signs: ED Triage Vitals  Enc Vitals Group     BP 04/03/22 1127 113/76     Pulse Rate 04/03/22 1127 77     Resp 04/03/22 1127 18     Temp 04/03/22 1127 98.2 F (36.8 C)     Temp Source 04/03/22 1127 Oral     SpO2 04/03/22 1127 93 %     Weight 04/03/22 1122 (!) 390 lb (176.9 kg)     Height 04/03/22 1122 '5\' 3"'$  (1.6 m)     Head Circumference --      Peak Flow --      Pain Score 04/03/22 1121 10     Pain Loc --      Pain Edu? --      Excl. in [place]? --     Most recent vital signs: Vitals:   04/03/22 1127 04/03/22 1354  BP: 113/76 (!) 104/52  Pulse: 77 69  Resp: 18 16  Temp: 98.2 F (36.8 C)   SpO2: 93% 100%     Constitutional: Alert  Eyes: Conjunctivae are normal.  Head: Atraumatic. Nose: No congestion/rhinnorhea. Mouth/Throat: Mucous membranes are moist.   Neck: Painless ROM.  Cardiovascular:   Good peripheral circulation. Respiratory: Normal respiratory effort.  No retractions.  Gastrointestinal: Soft and nontender.  Musculoskeletal: Severe lymphedema.  Right lower extremity is warm to touch with few areas of ulceration with serous drainage. Neurologic:  MAE spontaneously. No gross focal neurologic deficits are appreciated.  Skin:  Skin is warm, dry and intact. No rash noted. Psychiatric: Mood and affect are normal. Speech and  behavior are normal.    ED Results / Procedures / Treatments   Labs (all labs ordered are listed, but only abnormal results are displayed) Labs Reviewed  COMPREHENSIVE METABOLIC PANEL - Abnormal; Notable for the following components:      Result Value   Potassium 3.4 (*)    Glucose, Bld 101 (*)    BUN 41 (*)    Creatinine, Ser 1.48 (*)    Calcium 8.6 (*)    Total Protein 8.6 (*)    Albumin 3.4 (*)    GFR, Estimated 39 (*)    All other components within normal limits  CBC WITH DIFFERENTIAL/PLATELET - Abnormal; Notable for the following components:   Hemoglobin 11.1 (*)    All other components within normal limits  LACTIC ACID, PLASMA  LACTIC ACID, PLASMA  MAGNESIUM  PHOSPHORUS  URINALYSIS, ROUTINE W REFLEX MICROSCOPIC  HIV ANTIBODY (ROUTINE TESTING W REFLEX)     EKG     RADIOLOGY    PROCEDURES:  Critical Care performed: No  Procedures   MEDICATIONS ORDERED IN ED: Medications  enoxaparin (LOVENOX) injection 40 mg (40 mg Subcutaneous Given 04/03/22 1410)  acetaminophen (TYLENOL) tablet 650 mg (has no administration in time range)  Or  acetaminophen (TYLENOL) suppository 650 mg (has no administration in time range)  ondansetron (ZOFRAN) tablet 4 mg (has no administration in time range)    Or  ondansetron (ZOFRAN) injection 4 mg (has no administration in time range)  atenolol (TENORMIN) tablet 25 mg (25 mg Oral Not Given 04/03/22 1357)  bumetanide (BUMEX) tablet 2 mg (has no administration in time range)  cyanocobalamin (VITAMIN B12) tablet 500 mcg (has no administration in time range)  potassium chloride SA (KLOR-CON M) CR tablet 40 mEq (40 mEq Oral Given 04/03/22 1410)  nystatin (MYCOSTATIN/NYSTOP) topical powder (has no administration in time range)  vancomycin (VANCOREADY) IVPB 2000 mg/400 mL (has no administration in time range)  ceFEPIme (MAXIPIME) 2 g in sodium chloride 0.9 % 100 mL IVPB (2 g Intravenous New Bag/Given 04/03/22 1410)  morphine (PF) 4  MG/ML injection 4 mg (has no administration in time range)  oxyCODONE-acetaminophen (PERCOCET/ROXICET) 5-325 MG per tablet 1 tablet (has no administration in time range)  senna-docusate (Senokot-S) tablet 1 tablet (1 tablet Oral Given 04/03/22 1410)  polyethylene glycol (MIRALAX / GLYCOLAX) packet 17 g (has no administration in time range)  labetalol (NORMODYNE) injection 5 mg (has no administration in time range)  pregabalin (LYRICA) capsule 100 mg (has no administration in time range)     IMPRESSION / MDM / ASSESSMENT AND PLAN / ED COURSE  I reviewed the triage vital signs and the nursing notes.                              Differential diagnosis includes, but is not limited to, cellulitis, lymphedema, abscess, NSTI, sepsis  She presented to the ER for evaluation of symptoms as described above.  This presenting complaint could reflect a potentially life-threatening illness therefore the patient will be placed on continuous pulse oximetry and telemetry for monitoring.  Laboratory evaluation will be sent to evaluate for the above complaints.  She is being sent over from wound care clinic for IV antibiotics will order IV antibiotics will consult hospitalist for admission.  She is not septic.  She is otherwise hemodynamically stable.  Based on her severe lymphedema findings concerning for cellulitis I have consulted hospitalist for admission.      FINAL CLINICAL IMPRESSION(S) / ED DIAGNOSES   Final diagnoses:  Cellulitis of right lower extremity     Rx / DC Orders   ED Discharge Orders     None        Note:  This document was prepared using Dragon voice recognition software and may include unintentional dictation errors.    Merlyn Lot, MD 04/03/22 1452

## 2022-04-03 NOTE — Assessment & Plan Note (Signed)
-   Patient has BMI of 69. This complicates overall care and prognosis.

## 2022-04-03 NOTE — Progress Notes (Signed)
PHARMACIST - PHYSICIAN COMMUNICATION  CONCERNING:  Enoxaparin (Lovenox) for DVT Prophylaxis    RECOMMENDATION: Patient was prescribed enoxaprin '40mg'$  q24 hours for VTE prophylaxis.   Filed Weights   04/03/22 1122  Weight: (!) 176.9 kg (390 lb)    Body mass index is 69.09 kg/m.  Estimated Creatinine Clearance: 60.3 mL/min (A) (by C-G formula based on SCr of 1.48 mg/dL (H)).   Based on [place] patient is candidate for enoxaparin 0.'5mg'$ /kg TBW SQ every 24 hours based on BMI being >30.   DESCRIPTION: Pharmacy has adjusted enoxaparin dose per [facility] policy.  Patient is now receiving enoxaparin 87.5 mg every 24 hours    Dorothe Pea, PharmD, BCPS Clinical Pharmacist   04/03/2022 10:14 PM

## 2022-04-03 NOTE — ED Triage Notes (Signed)
First Nurse Note:  Sent to ED from [place].  Patient has bilateral lower extremity lymphedema with cellulitis.  Has been treated with Doxycycline, but cellulitis has failed to improve and has worsened.

## 2022-04-03 NOTE — Plan of Care (Signed)
  Problem: Clinical Measurements: Goal: Ability to maintain clinical measurements within normal limits will improve Outcome: Progressing   

## 2022-04-03 NOTE — Progress Notes (Addendum)
SYRAI, GLADWIN ([id number]) Visit Report for 04/03/2022 Chief Complaint Document Details Patient Name: [name], [name]. Date of Service: 04/03/2022 9:45 AM Medical Record Number: [id number] Patient Account Number: [id number] Date of Birth/Sex: [birth year] (66 y.o. F) Treating RN: Cornell Barman Primary Care Provider: Ranae Plumber Other Clinician: Massie Kluver Referring Provider: Ranae Plumber Treating Provider/Extender: Skipper Cliche in Treatment: 23 Information Obtained from: Patient Chief Complaint Bilateral LE lymphedema with Left LE Ulcers Electronic Signature(s) Signed: 04/03/2022 10:21:50 AM By: Worthy Keeler PA-C Entered By: Worthy Keeler on 04/03/2022 10:21:50 Dagley, [name] ([id number]) -------------------------------------------------------------------------------- HPI Details Patient Name: [name] Date of Service: 04/03/2022 9:45 AM Medical Record Number: [id number] Patient Account Number: [id number] Date of Birth/Sex: 12/25/1955 (66 y.o. F) Treating RN: Cornell Barman Primary Care Provider: Ranae Plumber Other Clinician: Massie Kluver Referring Provider: Ranae Plumber Treating Provider/Extender: Skipper Cliche in Treatment: 2 History of Present Illness HPI Description: The patient is a 66 year old female with history of hypertension and a long-standing history of bilateral lower extremity lymphedema (first presented on 4/2) . She has had open ulcers in the past which have always responded to compression therapy. She had briefly been to a lymphedema clinic in the past which helped her at the time. this time around she stopped treatment of her lymphedema pumps approximately 2 weeks ago because of some pain in the knees and then noticed the right leg getting worse. She was seen by her PCP who put her on clindamycin 4 times a day 2 days ago. The patient has seen [facility] and Dr. Delana Meyer had seen her last year where a vascular study including venous and  arterial duplex studies were within normal limits. he had recommended compression stockings and lymphedema pumps and the patient has been using this in about 2 weeks ago. She is known to be diabetic but in the past few time she's gone to her primary care doctor her hemoglobin A1c has been normal. 02/11/2015 - after her last visit she took my advice and went to the ER regarding the progressive cellulitis of her right lower extremity and she was admitted between July 17 and 22nd. She received IV antibiotics and then was sent home on a course of steroid-induced and oral antibiotics. She has improved much since then. 02/17/2015 -- she has been doing fine and the weeping of her legs has remarkably gone down. She has no fresh issues. READMISSION 01/15/18 This patient was given this clinic before most recently in 2016 seen by Dr. Con Memos. She has massive bilateral lymphedema and over the last 2 months this had weeping edema out of the left leg. She has compression pumps but her compliance with these has been minimal. She has [facility] they've been using TCA/ABDs/kerlix under an Ace wrap.she has had recent problems with cellulitis. She was apparently seen in the ER and 12/23/17 and given clindamycin. She was then followed by her primary doctor and given doxycycline and Keflex. The pain seems to have settled down. In April 2018 the patient had arterial studies done at [facility]. This showed triphasic waveforms throughout the right leg and mostly triphasic waveforms on the left except for monophasic at the posterior tibial artery distally. She was not felt to have evidence of right lower extremity arterial stenosis or significant problems on the left side. 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 She also had a right lower extremity venous Doppler in January 2018 this was limited  by the patient's body habitus and lymphedema. Most of the proximal veins were  not visualized The patient presents with an area of denuded skin on the anterior medial part of the left calf. There is weeping edema fluid here. 01/22/18; the patient has somewhat better edema control using her compression pumps twice a day and as a result she has much better epithelialization on the left anterior calf area. Only a small open area remains. 01/29/18; the patient has been compliant with her compression pumps. Both the areas on her calf that healed. The remaining area on the left anterior leg is fully epithelialized Readmission: 02/20/2019 upon evaluation today patient presents for reevaluation due to issues that she is having with the bilateral lower extremities. She actually has wounds open on both legs. On the right she has an area in the crease of her leg on the right around the knee region which is actually draining quite a bit and actually has some fungal type appearance to it. She has been on nystatin powder that seems to have helped to some degree. In regard to the left lower extremity this is actually in the lower portion of her leg closer to the ankle and again is continuing to drain as well unfortunately. There does not appear to be any signs of active infection at this time which is good news. No fevers, chills, nausea, vomiting, or diarrhea. She tells me that since she was seen last year she is actually been doing quite well for the most part with regard to her lower extremities. Unfortunately she now is experiencing a little bit more drainage at this time. She is concerned about getting this under control so that it does not get significantly worse. 02/27/2019 on evaluation today patient appears to be doing somewhat better in regard to her bilateral lower extremity wounds. She has been tolerating the dressing changes without complication. Fortunately there is no signs of active infection at this point. No fevers, chills, nausea, vomiting, or diarrhea. She did get her  dressing supplies which is excellent news she was extremely excited to get these. She also got paperwork from [employer] for their financial assistance program where they may be able to help her out in the future if needed with supplies at discounted prices. 03/06/2019 on evaluation today patient appears to be doing a little worse with regard to both areas of weeping on her bilateral lower extremities. This is around the right medial knee and just above the left ankle. With that being said she is unfortunately not doing as well as I would like to see. I feel like she may need to potentially go see someone at the lymphedema clinic as the wraps that she needs or even beyond what we can do here at the wound care center. She really does not have wounds she just has open areas of weeping that are causing some difficulty for her. Subsequently because of this and the moisture I am concerned about the potential for infection I am going to likely give her a prophylactic antibiotic today, Keflex, just to be on the safe side. Nonetheless again there is no obvious signs of active infection at this time. 03/13/2019 on evaluation today patient appears to be doing well with regard to her bilateral lower extremities where she has been weeping compared to even last week's evaluation. I see some areas of new skin growth which is excellent and overall I am very pleased with how things seem to be progressing. No fevers, chills, nausea,  vomiting, or diarrhea. [name], [name] ([id number]) 03/20/2019 on evaluation today patient unfortunately is continuing to have issues with significant edema of the left lower extremity. Her right side seems to be doing much better. Unfortunately her left side is showing increased weeping of the lower portion of her leg. This is quite unfortunate obviously we were hoping to get her into the lymphedema clinic they really do not seem to when I see her how if she is draining. Despite the fact this is  really not wound related but more lymphedema weeping related. Nonetheless I do not know that this can be helpful for her to even go for that appointment since again I am not sure there is much that they would actually do at this point. We may need to try a 4 layer compression wrap as best we can on her leg. She is on the Augmentin currently although I am still concerned about whether or not there could be potentially something going on infection wise I would obtain a culture though I understand is not the best being that is a surface culture I just 1 to make sure I do not seem to be missing anything. 03/27/2019 on evaluation today patient appears to be doing much better in regard to the left lower extremity compared to last week. Last week she had tremendous weeping which I think was subsequent to infection now she seems to be doing much better and very pleased. This is not completely healed but there is a lot of new skin growth and it has dried out quite a bit. Overall I think that we are doing well with how things are moving along at this time. No fevers, chills, nausea, vomiting, or diarrhea. 04/03/2019 evaluation today patient appears to be doing a little worse this week compared to last time I saw her. I think this may be due to the fact that she is having issues with not being able to sleep in her bed at least not until last night. She is therefore been in a lift chair and subsequently has also had issues with not been able to use her pumps since she could not get in bed. With that being said the patient overall seems to be doing okay I do think I may want extend the antibiotic for a little bit longer at least until we can see if her edema and her weeping gets better and if it is then obviously I can always discontinue the antibiotics as of next week however I want her to continue to have it over the next week. 04/10/2019 on evaluation today patient unfortunately is still doing poorly with regard  to her left lower extremity. Her right is all things considering doing fairly well. On the left however she continues to have spreading of the area of infection and weeping which appears to be even a larger surface area than noted last week. She did have a positive culture for Pseudomonas in particular which seems to have been of concern she still has green/yellow discharge consistent with Pseudomonas and subsequently a tremendous amount of it. This has me obviously still concerned about the infection not really clearing up despite the fact that on culture it appears the Cipro should have been a good option for treating this. I think she may at this point need IV antibiotics since things are not doing better I do not want to get worse and cause sepsis. She is in agreement with the plan and believes as well that  she likely does need to go to the hospital for IV vancomycin. Or something of the like depending on what the recommendation is from the ER. 04/17/2019 on evaluation today patient appears to be doing excellent in regard to her lower extremity on the left. She was in the hospital for several days from when I sent her last we saw her until just this past Tuesday. Fortunately her drainage is significantly improved and in fact is mostly clear. There is just a couple small areas that may still drain a little bit she states that the [facility] they prescribed for her at discharge she went picked up from pharmacy and got home but has not been able to find it since. She is looked everywhere. She is wondering if I will replace that for her today I will be more than happy to do that. 05/01/2019 on evaluation today patient actually appears to be doing quite well with regard to her lower extremities. She occasionally is having areas that will leak and then heal up mainly when a piece of the fibrotic skin pops off but fortunately she is not having any signs of active infection at this time. Overall she also  really does not have any obvious weeping at this time. I do believe however she really needs some compression wraps and I think this may be a good time to get her back to the lymphedema clinic. 05/11/2019 on evaluation today patient actually appears to be doing quite well with regard to her bilateral lower extremities. She occasionally will have a small area that we per another but in general seems to be completely healed which is great news. Overall very pleased with how everything seems to be progressing. She does have her appointment with lymphedema clinic on November 18. 05/25/2019 on evaluation today patient appears to be doing well with regard to her left lower extremity. I am very pleased in this regard. In regard to her right leg this actually did start draining more I think it is mainly due to the fact that her leg is more swollen. I am not seeing any obvious signs of infection at this time although that is definitely something were obviously acutely aware of simply due to the fact that she had an issue not too far back with exactly this issue. Nonetheless I do feel like that lymphedema clinic would still be beneficial for her. I explained obviously if they are not able to do anything treatment wise on the right leg we could at least have them treat her left leg and then proceed from there. The patient is really in agreement with that plan. If they are able to do both as the drainage slows down that I would be happy to let them handle both. 06/01/2019 on evaluation today patient unfortunately appears to be doing worse with regard to her right lower extremity. The left lower extremity is still maintaining at this point. Unfortunately she has been having significantly increased pain over the past several days and has been experiencing as well increased swelling of the right lower extremity. I really do not know that I am seeing anything that appears to be obvious for infection at this point to  be peripherally honest. With that being said the patient does seem to be having much more swelling that she is even experienced in the past and coupled with increased pain in her hip as well I am concerned that again she could potentially have a DVT although I am not 100% sure  of this. I think it something that may need to be checked out. We discussed the possibility of sending her for a DVT study through the hospital but unfortunately transportation is an issue if she does have a DVT I do not want her to wait days to be able to get in for that test however if she has this scheduled as an outpatient that is as fast that she will be able to get the test scheduled for transportation purposes. That will also fall on Thanksgiving so subsequently she did actually be looking at either Friday or even next week before we would know anything back from this. That is much too long in my opinion. Subsequent to the amount of discomfort she is experiencing the patient is actually okay with going to the ER for evaluation today. 06/12/2019 on evaluation today patient actually appears to be doing significantly better compared to last time I saw her. Following when I last saw her she was actually in the hospital from that Monday until the following Sunday almost 1 full week. She actually was placed on Keflex in the hospital following the time for her to be discharged and Dr. Steva Ready has recommended 2 times a day dosing of the Keflex for the next year in order to help with more prophylactic/preventative measures with regard to her developing cellulitis. Overall I think this sounds like an excellent plan. The patient unfortunately is good to have trouble being treated at lymphedema clinic due to the fact that she really cannot get up on the bed that they have there. They also state that they cannot manage her as long as she has anything draining at this point. Obviously that is somewhat unfortunate as she does need help  with edema control but nonetheless we will have to do what we can for her outside of it sounds like the lymphedema clinic scenario at this point. 06/19/2019 on evaluation today patient appears to be doing fairly well with regard to her bilateral lower extremities. She is not nearly as swollen and shows no signs of infection at this point. There is no evidence of cellulitis whatsoever. She also has no open wounds or draining at this point which is also good news. No fever chills noted. She seems to be in very good spirits and in fact appears to be doing quite well. READMISSION 11/27/2019 [name], [name] ([id number]) This is a 66 year old woman that we have had in this clinic several times before including 2015, 16 and 19 and then most recently from 03/20/2019 through 06/19/2019 with bilateral lower extremity lymphedema. She has had previous arterial and reflux studies done years ago which were not all that remarkable. 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 She does have a positive family history and she had large legs starting may be in her 24s. She was recently in hospital from 10/20/2019 through 10/28/2019 with right leg cellulitis. She was given Ancef and clindamycin and then Zosyn when a culture showed Pseudomonas. At that time there was purulent drainage. She was followed by infectious disease Dr Steva Ready. The patient is now back at home. She has noted increased swelling in the right and no drainage in her right leg mostly on the posterior medial aspect in the calf area. She has not had pain or fever. She has literally been improved lysing above dressings because her at the area of this is far too large for standard compression. She has been wrapping the areas  with sheets to resorptive pads. She is found these helped somewhat. She does have an appointment with the lymphedema clinic in [place] in late June. Past medical history  includes bilateral lymphedema, hypertension, obstructive sleep apnea with CPAP. Recent hospitalization with apparently Pseudomonas cellulitis of the right lower leg 12/15/2019 upon evaluation today patient appears to be doing a little bit worse in regard to her right lower extremity. Unfortunately she is having more weeping down in the lower portion of her leg. Fortunately there is no signs of active infection at this time. No fever chills noted. The patient states she is not having increased pain except for when she attempted to use the lymphedema pumps unfortunately she states that she did have pain when she did this. Otherwise we been using absorptive dressings of one type or another she is using diapers at home and then subsequently Ace wraps. In regard to the barrier cream we have discussed the possibility of derma cloud which she would like to try I do not have a problem with that. 12/22/2019 upon evaluation today patient actually appears to be doing better in regard to her leg ulcers at this point. Fortunately there does not appear to be any signs of active infection which is great news and I am extremely pleased with where things are progressing at this time. There is no sign of active infection currently. The patient is very pleased to see things doing so well. 12/29/2019 upon evaluation today patient appears to be doing a little bit better in regard to her weeping in general over her lower extremities. She does have some signs of mild erythema little bit more than what I noted last week or rather last visit. Nonetheless I think that my threshold for switching her antibiotics from Keflex to something else is very low at this point considering that she has had such severe infections in the past that seem to come almost out of nowhere. There is a little erythema and warmth noted of the lower portion of her leg compared to the upper which also makes me want to go ahead and address things more  rapidly at this point. Likely I would switch out the Keflex for something like Levaquin ideally. 7/16; patient with severe bilateral lymphedema. She has superficial wounds albeit almost circumferential now on the left lateral lower leg. This may be new from last time. Small area on the right anterior lower leg and then another area on the right medial lower leg and of pannus fold. She has been using various absorptive garments. She states she is using her compression pumps once a day occasionally twice. Culture from her last visit here was negative 01/29/2020 on evaluation today patient appears to be doing excellent at this point in regard to her legs with regard to infection I see no signs of active infection at this point. She still does have unfortunately areas of weeping this is minimal on the right now her left is actually significantly worse although I do not think it is as bad as last week with Dr. Dellia Nims saw her. She has been trying to pump and elevate her legs is much as possible. She has previously been on the Keflex and in the past for prevention that seems to do fairly well and likely can extend that today. 02/04/2020 on evaluation today patient appears to be doing better in regard to her legs bilaterally. Fortunately there is no signs of active infection at this time which is great news and overall  she has less weeping on the left compared to the right and there is several spots where she is pretty much sealed up with no draining regions. Overall very pleased in this regard. 02/19/2020 on evaluation today patient appears to be doing very well in regard to her wounds currently. Fortunately there is no evidence of active infection overall very pleased with where things stand. She is significantly improved in regard to her edema I am extremely pleased in this regard she tells me that the popping no longer hurts and in fact she actually looks forward to it. 03/04/2020 on evaluation today patient  appears to be doing excellent in regard to her lower extremities. Fortunately there is no signs of active infection at this time. No fevers, chills, nausea, vomiting, or diarrhea. 03/25/2020 on evaluation today patient appears to be doing a little bit more poorly in regard to her legs at this point. She tells me that she is still continue to have issues with drainage and this has been a little bit worse she was getting ready to start taking the Keflex again but wanted to see me first. Fortunately there is no signs of active infection at this time. No fevers, chills, nausea, vomiting, or diarrhea. 04/15/2020 upon evaluation today patient appears to be doing somewhat poorly in regard to her right leg. She tells me she has been having more pain she has been taking the Keflex that was previously prescribed unfortunately that just does not seem to help with this. She was hoping that the pain on her right was actually coming from the fact that she was having issues with her wrap having gotten caught in her recliner. With that being said she tells me that she knew something was not right. Currently her right leg is warm to touch along with being erythematous all the way up to around at least mid thigh as far as I can see. The left leg does not appear to be doing that badly though there is increased weeping around the ankle region. 05/06/2020 on evaluation today patient appears to be doing much better than last time I saw her. She did go to the hospital where she was admitted for 2 days and treated with antibiotic therapy. She was discharged with antibiotics as well and has done extremely well. I am extremely pleased with where things stand today. There is no signs of active infection at this time which is great news. 05/27/2020 upon evaluation today patient appears to be doing well with regard to her lower extremities bilaterally. She has just a very tiny area on the right leg which is opening on the left leg  she is significantly improved though she still has several areas that do appear to be open this is minimal compared to what is been in the past. In general I am extremely pleased with where things stand today. The patient does tell me she is not been using her pumps quite as much as she should be. I do believe that is 1 area she can definitely work on. She has had a lot going on including a Covid exposure and apparently also a outbreak of likely shingles. 06/24/2020 upon evaluation today patient appears to be doing well with regard to her legs in general although the left leg unfortunately is showing some signs of erythema she does have a little bit of increased weeping and to be honest I am concerned about infection here. I discussed that with her today and I think that we  may need to address this sooner rather than later she has been taking Keflex she is not really certain that is been making a big improvement however. No fevers, chills, nausea, vomiting, or diarrhea. [name], [name] ([id number]) 06/30/2020 upon evaluation today patient's legs actually seem to be doing better in my opinion as compared to where they were last week. Fortunately there does not appear to be any signs of active infection. Her culture showed multiple organisms nothing predominate. With that being said the Levaquin seems to have done well I think she has improved since I last saw her as well. 07/14/2020 upon evaluation today patient appears to be doing actually better in regard to her lower extremities in my opinion. She has been tolerating the dressing changes without complication. Fortunately there is no signs of active infection at this time. 08/04/2020 upon evaluation today patient appears to be doing excellent in regard to her leg ulcers. Fortunately she has very little that is open at this point. This is great news. In fact I think that the Goldbond medicated powder has been excellent for her. It seems to have done the  trick where we had tried several other things without as much success. Fortunately there is no evidence of active infection at this time. No fevers, chills, nausea, vomiting, or diarrhea. 09/01/2020 upon evaluation today patient appears to be doing a little bit more poorly than the last time I saw her. She tells me right now that she has been having a lot of drainage compared to where things were previous which has unfortunately led to more irritation as well. She is concerned that this is leading to infection. Again based on what I am seeing today as well I am also concerned of the same to be honest. 09/15/2020 upon evaluation today patient appears to be doing well with regard to her legs compared to what I saw previous. Fortunately there does not appear to be any evidence of active infection at this time which is great news. At least not as badly as what it was previous. With that being said I do believe that the patient does need to have an extension of the antibiotics. This I believe will actually help her more in the way of making sure this stays under control and does not worsen. That was the Bactrim DS. Readmission: 08/01/2021 upon evaluation today patient appears to be doing actually pretty well all things considered. Is actually been almost a year since have seen her last March. Fortunately I do not see any evidence of active infection locally nor systemically at this point. She does have a couple open areas on the left leg the right leg appears to be doing quite well. In general she has been in the hospital 3 times since I saw her a year ago all 4 cellulitis type issues except for the last 1 which was actually more related to congestive heart failure for that reason she is not using her lymphedema pumps at this point and that is probably the best idea. 08/08/2021 upon evaluation today patient appears to be doing well with regard to her legs all things considered her left leg may be a little bit  more swollen based on what I see today. Fortunately I do not see any signs of active infection locally nor systemically at this time which is great news. No fevers, chills, nausea, vomiting, or diarrhea. 08/15/2021 upon evaluation today patient actually appears to be doing excellent in regard to her wounds. She has  been tolerating the dressing changes without complication. Fortunately I see no evidence of infection currently which is great news. No fevers, chills, nausea, vomiting, or diarrhea. 08/29/2021 upon evaluation patient appears to be doing excellent she in fact is almost completely healed. I am actually very pleased with where we stand today and I think that she is making wonderful progress she just has a small area of weeping on the right lateral leg everything else is pretty much completely healed which is also. 09/05/2021 upon evaluation today patient appears to be doing well with regard to her legs. She does have a small area of weeping on the right leg and there is a small area on the left leg as well. It is potentially something that may need to be addressed in the future more specifically but right now I think that there may have just been a little drainage here on the left. With all that being said I think that this still does not appear to be infected and overall I feel like she is doing quite well. That something we always have to keep a very close eye on as she can change very rapidly from okay to not okay. 09/12/2021 upon evaluation today patient appears to be doing a little bit worse in regard to her leg and actually somewhat concerned about the possibility of infection here. I discussed that with the patient. For that reason I am going to go ahead and see about getting her set up for a repeat prescription for the Bactrim. She is in agreement with that plan. 3/14; patient presents for follow-up. She has been using silver alginate with dressing changes. She is still taking Bactrim  prescribed at last clinic visit. She has no issues or complaints today. She has lymphedema pumps however does not use these. 09/26/2020 upon evaluation today patient appears to actually be doing well in regard to her right leg I am pleased in that regard. Unfortunately she has new areas on her left leg that are open at this point that I do need to be addressed. I am also concerned about the warmth around one of the wounds on the more anterior side. I think this is something that we may need to culture today potentially requiring antibiotics going forward. 10/03/2021 upon evaluation today patient unfortunately is not doing nearly as well as she was even last week with her wounds. She is having some issues here with increased cellulitis of the left lower extremity unfortunately. I did prescribe Augmentin for her eczema prescribed last week unfortunately she never actually received this prescription. The pharmacy stated they never got it. We double checked with them today they still did not receive it. I am not sure what happened in that regard. With that being said the patient is in good spirits today she does not appear to be as sick as where she normally is when the cellulitis gets significantly worse as it has in the past. Nonetheless the leg is much more red not just around the ankle where I was seeing at last week on Tuesday but rather now this is going all the way up to almost her knee and is definitely hot to touch compared to the right leg. Nonetheless I feel like she does need to go to the ER for likely IV antibiotics. 10-10-2021 upon evaluation today patient appears to be doing well with regard to her wound. She has been tolerating the dressing changes that appears to be doing much better. After I saw  her last week she did go to the ER they admitted her to the hospital and gave her IV antibiotics she fortunately is doing significantly better. Overall I am extremely pleased with where things stand  today. 10-17-2021 upon evaluation today patient appears to be doing well with regard to her wound. In fact this is looking better and her leg is looking better but she still does have some areas here of erythema. We will continue to address this as soon as possible in my opinion. I do believe she may require some debridement and what appears to be a more defined wound on her left leg at this point. 10-24-2021 upon evaluation patient is definitely showing signs of improvement which is great news. I do not see any evidence of active infection locally or systemically which is great news as well. No fevers, chills, nausea, vomiting, or diarrhea. 10-31-2021. Upon evaluation today patient's leg actually feels better from an infection and wound care perspective. I am actually very pleased in this regard. Unfortunately the biggest issue that I see at this time is that the patient is having a significant issue with her breathing. I did put her on the pulse ox machine to check her oxygen saturation and it was pretty much at 100% which is good but she tells me that when she is walking [name], [name] ([id number]) this is much more difficult for her. She also tells me that when she is trying to bend over to perform her dressing changes she is so short of breath due to the swelling and fluid in her abdominal area that she is just not been able to do this. She is tearful and very concerned today to be honest. I am truly sorry to see her this way I know she is really having a hard time at the moment. 11-14-2021 upon evaluation patient actually appears to be doing significantly better compared to when I last saw her. She was actually admitted to the hospital I believe this for 6 days total after I sent her on 10-31-2021. Subsequently they actually pulled off greater than 50 pounds of fluid she tells me she feels significantly better her legs are not as tight she actually has some play in the tissue and it is obvious that  she is doing much better just from a mobility standpoint as well as a breathing standpoint. Overall I am extremely happy for her and how she is doing the leg also looks much better on the left. 11-21-2021 upon evaluation today patient appears to be doing well although it does appear that she is going require some sharp debridement the wound is very dry this is the opposite of what its been she was having so much weeping that it was staying extremely wet. Nonetheless we do need to see about going ahead and debriding the wound today. 11-28-2021 upon evaluation today patient appears to be doing well with regard to her wound I definitely see signs of things continuing to improve which is great news. I do not see any evidence of active infection locally or systemically also great news. 12-05-2021 upon evaluation today patient appears to be doing well with regard to her wound. She has been tolerating the dressing changes. Fortunately there does not appear to be any signs of active infection locally or systemically at this time. No fevers, chills, nausea, vomiting, or diarrhea. 12-12-2021 upon evaluation patient appears to be doing well with regard to her wound this is actually measuring smaller and looking much  better. Fortunately I do not see any signs of active infection locally or systemically at this time which is excellent news. 6/13; small wound area in the folds of her lymphedema just above the left ankle. Fortunately the area looks quite good. She has been using silver alginate ABDs and an Ace wrap which she is able to change her self. She is not using her compression pumps out of fear that this could contribute to heart failure. 12-26-2021 upon evaluation patient's wounds are actually showing signs of excellent improvement. I am very pleased with where things stand. Overall I think that she has been making excellent progress and I think we are very close to complete resolution which is great  news. 01-11-2022 upon evaluation today patient appears to be doing well currently in regard to her legs in general although on the left leg she does have 1 area that still irritated and inflamed on the anterior portion of her leg. Fortunately I do not see any signs of systemic infection locally there might be some infection going on here however which is my biggest concern. Fortunately I do not see any evidence though of this spreading to any other location. 01-16-2022 upon evaluation today patient has actually showing signs of excellent improvement. Fortunately I do not see any evidence of infection locally or systemically which is great news and overall I am very pleased with where we stand at this point. 01-25-2022 upon evaluation today patient actually appears to be doing decently well in regard to her legs. She has been tolerating the dressing changes without complication. Fortunately there does not appear to be any evidence of active infection locally or systemically which is great news and overall I am extremely pleased with where we stand at this point. 02-13-2022 upon evaluation today patient appears to be doing well currently in regard to her wound which is actually showing signs of significant improvement. Fortunately I do not see any evidence of active infection locally or systemically at this time. No fevers, chills, nausea, vomiting, or diarrhea. 02-20-2022 upon evaluation today patient appears to be doing well currently in regard to her wound. She has been tolerating the dressing changes without complication. Fortunately there does not appear to be any signs of active infection locally or systemically at this time. No fevers, chills, nausea, vomiting, or diarrhea. 02-27-2022 upon evaluation today patient appears to be doing excellent in regard to her wounds on the leg. She has been tolerating the dressing changes without complication this is very close to complete resolution. Fortunately I do  not see any signs of infection locally or systemically at this time which is great news. No fevers, chills, nausea, vomiting, or diarrhea. 03-06-2022 upon evaluation today patient appears to be doing well currently in regard to her wound in general. She has been tolerating the dressing changes without complication. Fortunately I do not see any evidence of active infection locally or systemically at this time which is great news. I am concerned a little bit however about not really her wounds but the second toe right foot where there appears to be some cellulitis after she dropped a frying pan on this about 2 weeks ago. She tells me its been itching and burning and I do think that it is possible she may have an infection based on what we are seeing currently. 03-20-2022 upon evaluation today patient actually appears to be doing much better at this point. Fortunately there does not appear to be any signs of active infection locally  or systemically at this time which is great news. No fevers, chills, nausea, vomiting, or diarrhea. 03-27-2022 upon evaluation today patient appears to be doing somewhat poorly in regard to her legs compared to previous. Her right leg has a couple areas that are open and she is very warm and painful to touch around the lateral portion of the ankle area. Left leg is showing signs of little bit more drainage and weeping as well I think that this is probably due to the fact that she is swelling a lot more that she has been in the past. Fortunately I do not see any signs of active infection systemically though locally there is definitely some issues here. 04-03-2022 upon evaluation today patient appears to be doing poorly in regard to her wounds on the legs unfortunately. She also seems to have more significant cellulitis that has ensued. Unfortunately she has been on doxycycline initially it seemed to be helping but as of this week that is no longer the case. It started getting  worse last week and with the reinitiation of the doxycycline nothing has improved. Often when she gets like this IV antibiotics are necessary. Electronic Signature(s) Signed: 04/03/2022 10:57:49 AM By: Worthy Keeler PA-C Entered By: Worthy Keeler on 04/03/2022 10:57:49 Dollins, [name] ([id number]) [name], [name] ([id number]) -------------------------------------------------------------------------------- Physical Exam Details Patient Name: [name], ALBERTS. Date of Service: 04/03/2022 9:45 AM Medical Record Number: [id number] Patient Account Number: [id number] Date of Birth/Sex: 1956-05-06 (66 y.o. F) Treating RN: Cornell Barman Primary Care Provider: Ranae Plumber Other Clinician: Massie Kluver Referring Provider: Ranae Plumber Treating Provider/Extender: Skipper Cliche in Treatment: 6 Constitutional Well-nourished and well-hydrated in no acute distress. Respiratory normal breathing without difficulty. Psychiatric this patient is able to make decisions and demonstrates good insight into disease process. Alert and Oriented x 3. pleasant and cooperative. Notes Upon inspection patient's wound bed actually showed signs of poor signs of healing at this point. I do not see any evidence of infection which is great news. She does not actually seem to have any issues with systemic infection but she is starting to feel poorly due to her legs and her legs are hurting pretty badly right now. Electronic Signature(s) Signed: 04/03/2022 10:58:24 AM By: Worthy Keeler PA-C Entered By: Worthy Keeler on 04/03/2022 10:58:24 [name] ([id number]) -------------------------------------------------------------------------------- Physician Orders Details Patient Name: [name] Date of Service: 04/03/2022 9:45 AM Medical Record Number: [id number] Patient Account Number: [id number] Date of Birth/Sex: 1955/12/22 (66 y.o. 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 o Nurse Visit as needed Bathing/ Shower/ Hygiene o Clean wound with Normal Saline or wound cleanser. o Wash wounds with antibacterial soap and water. o May shower; gently cleanse wound with antibacterial soap, rinse and pat dry prior to dressing wounds o No tub bath. Edema Control - Lymphedema / Segmental Compressive Device / Other o Ace wraps o Elevate, Exercise Daily and Avoid Standing for Long Periods of Time. o Elevate leg(s) parallel to the floor when sitting. o DO YOUR BEST to  sleep in the bed at night. DO NOT sleep in your recliner. Long hours of sitting in a recliner leads to swelling of the legs and/or potential wounds on your backside. o Other: - use lymphedema pumps Non-Wound Condition Bilateral Lower Extremities o Cleanse affected area with antibacterial soap and water, o Apply appropriate compression. - ace wrap o Additional non-wound orders/instructions: - silver cell to open weeping areas with abd pad Medications-Please add to medication list. o P.O. Antibiotics - continue doxycycline as directed Wound Treatment Wound #10 - Lower Leg Wound Laterality: Left, Medial, Anterior Cleanser: Soap and Water (Generic) Discharge Instructions: Gently cleanse wound with antibacterial soap, rinse and pat dry prior to dressing wounds Primary Dressing: SILVERCEL Antimicrobial Alginate Dressing, 1x12 (in/in) (Generic) Secondary Dressing: Chuks Secured With: ACE WRAP -  10M ACE Elastic Bandage With VELCRO Brand Closure, 4 (in) (Generic) Notes Patient instructed to go to ED for evaluation of bilat lower leg swelling, redness, pain Electronic Signature(s) Signed: 04/03/2022 4:52:08 PM By: Worthy Keeler PA-C Signed: 04/06/2022 12:10:36 PM By: Massie Kluver Entered By: Massie Kluver on 04/03/2022 16:18:35 [name], [name] ([id number]) [name], [name] ([id number]) -------------------------------------------------------------------------------- Problem List Details Patient Name: [name]. Date of Service: 04/03/2022 9:45 AM Medical Record Number: [id number] Patient Account Number: [id number] Date of Birth/Sex: 10/26/1955 (66 y.o. F) Treating RN: Cornell Barman Primary Care Provider: Ranae Plumber Other Clinician: Massie Kluver Referring Provider: Ranae Plumber Treating Provider/Extender: Skipper Cliche in Treatment: 35 Active Problems ICD-10 Encounter Code Description Active Date MDM Diagnosis Q82.0 Hereditary lymphedema 08/01/2021 No Yes L97.811 Non-pressure chronic ulcer of other part of right lower leg limited to 08/01/2021 No Yes breakdown of skin L97.821 Non-pressure chronic ulcer of other part of left lower leg limited to 08/01/2021 No Yes breakdown of skin Inactive Problems Resolved Problems Electronic Signature(s) Signed: 04/03/2022 10:21:46 AM By: Worthy Keeler PA-C Entered By: Worthy Keeler on 04/03/2022 10:21:46 Knipfer, [name] ([id number]) -------------------------------------------------------------------------------- Progress Note Details Patient Name: [name]. Date of Service: 04/03/2022 9:45 AM Medical Record Number: [id number] Patient Account Number: [id number] Date of Birth/Sex: Dec 03, 1955 (66 y.o. F) Treating RN: Cornell Barman Primary Care Provider: Ranae Plumber Other Clinician: Massie Kluver Referring Provider: Ranae Plumber Treating Provider/Extender: Skipper Cliche in Treatment:  50 Subjective Chief Complaint Information obtained from Patient Bilateral LE lymphedema with Left LE Ulcers History of Present Illness (HPI) The patient is a 67 year old female with history of hypertension and a long-standing history of bilateral lower extremity lymphedema (first presented on 4/2) . She has had open ulcers in the past which have always responded to compression therapy. She had briefly been to a lymphedema clinic in the past which helped her at the time. this time around she stopped treatment of her lymphedema pumps approximately 2 weeks ago because of some pain in the knees and then noticed the right leg getting worse. She was seen by her PCP who put her on clindamycin 4 times a day 2 days ago. The patient has seen [facility] and Dr. Delana Meyer had seen her last year where a vascular study including venous and arterial duplex studies were within normal limits. he had recommended compression stockings and lymphedema pumps and the patient has been using this in about 2 weeks ago. She is known to be diabetic but in the past few time she's gone to her primary care doctor her hemoglobin A1c has been normal. 02/11/2015 - after her last visit she took my advice and went to the  ER regarding the progressive cellulitis of her right lower extremity and she was admitted between July 17 and 22nd. She received IV antibiotics and then was sent home on a course of steroid-induced and oral antibiotics. She has improved much since then. 02/17/2015 -- she has been doing fine and the weeping of her legs has remarkably gone down. She has no fresh issues. READMISSION 01/15/18 This patient was given this clinic before most recently in 2016 seen by Dr. Con Memos. She has massive bilateral lymphedema and over the last 2 months this had weeping edema out of the left leg. She has compression pumps but her compliance with these has been minimal. She has [facility] they've been using TCA/ABDs/kerlix under an  Ace wrap.she has had recent problems with cellulitis. She was apparently seen in the ER and 12/23/17 and given clindamycin. She was then followed by her primary doctor and given doxycycline and Keflex. The pain seems to have settled down. In April 2018 the patient had arterial studies done at [facility]. This showed triphasic waveforms throughout the right leg and mostly triphasic waveforms on the left except for monophasic at the posterior tibial artery distally. She was not felt to have evidence of right lower extremity arterial stenosis or significant problems on the left side. 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 She also had a right lower extremity venous Doppler in January 2018 this was limited by the patient's body habitus and lymphedema. Most of the proximal veins were not visualized The patient presents with an area of denuded skin on the anterior medial part of the left calf. There is weeping edema fluid here. 01/22/18; the patient has somewhat better edema control using her compression pumps twice a day and as a result she has much better epithelialization on the left anterior calf area. Only a small open area remains. 01/29/18; the patient has been compliant with her compression pumps. Both the areas on her calf that healed. The remaining area on the left anterior leg is fully epithelialized Readmission: 02/20/2019 upon evaluation today patient presents for reevaluation due to issues that she is having with the bilateral lower extremities. She actually has wounds open on both legs. On the right she has an area in the crease of her leg on the right around the knee region which is actually draining quite a bit and actually has some fungal type appearance to it. She has been on nystatin powder that seems to have helped to some degree. In regard to the left lower extremity this is actually in the lower portion of her leg closer to the ankle and  again is continuing to drain as well unfortunately. There does not appear to be any signs of active infection at this time which is good news. No fevers, chills, nausea, vomiting, or diarrhea. She tells me that since she was seen last year she is actually been doing quite well for the most part with regard to her lower extremities. Unfortunately she now is experiencing a little bit more drainage at this time. She is concerned about getting this under control so that it does not get significantly worse. 02/27/2019 on evaluation today patient appears to be doing somewhat better in regard to her bilateral lower extremity wounds. She has been tolerating the dressing changes without complication. Fortunately there is no signs of active infection at this point. No fevers, chills, nausea, vomiting, or diarrhea. She did get her dressing supplies which is excellent news she  was extremely excited to get these. She also got paperwork from [employer] for their financial assistance program where they may be able to help her out in the future if needed with supplies at discounted prices. 03/06/2019 on evaluation today patient appears to be doing a little worse with regard to both areas of weeping on her bilateral lower extremities. This is around the right medial knee and just above the left ankle. With that being said she is unfortunately not doing as well as I would like to see. I feel like she may need to potentially go see someone at the lymphedema clinic as the wraps that she needs or even beyond what we can do here at the wound care center. She really does not have wounds she just has open areas of weeping that are causing some difficulty for her. Subsequently because of this and the moisture I am concerned about the potential for infection I am going to likely give her a prophylactic antibiotic today, Keflex, just to be on the safe side. Nonetheless again there is no obvious signs of active infection at this  time. STACYE, NOORI ([id number]) 03/13/2019 on evaluation today patient appears to be doing well with regard to her bilateral lower extremities where she has been weeping compared to even last week's evaluation. I see some areas of new skin growth which is excellent and overall I am very pleased with how things seem to be progressing. No fevers, chills, nausea, vomiting, or diarrhea. 03/20/2019 on evaluation today patient unfortunately is continuing to have issues with significant edema of the left lower extremity. Her right side seems to be doing much better. Unfortunately her left side is showing increased weeping of the lower portion of her leg. This is quite unfortunate obviously we were hoping to get her into the lymphedema clinic they really do not seem to when I see her how if she is draining. Despite the fact this is really not wound related but more lymphedema weeping related. Nonetheless I do not know that this can be helpful for her to even go for that appointment since again I am not sure there is much that they would actually do at this point. We may need to try a 4 layer compression wrap as best we can on her leg. She is on the Augmentin currently although I am still concerned about whether or not there could be potentially something going on infection wise I would obtain a culture though I understand is not the best being that is a surface culture I just 1 to make sure I do not seem to be missing anything. 03/27/2019 on evaluation today patient appears to be doing much better in regard to the left lower extremity compared to last week. Last week she had tremendous weeping which I think was subsequent to infection now she seems to be doing much better and very pleased. This is not completely healed but there is a lot of new skin growth and it has dried out quite a bit. Overall I think that we are doing well with how things are moving along at this time. No fevers, chills, nausea,  vomiting, or diarrhea. 04/03/2019 evaluation today patient appears to be doing a little worse this week compared to last time I saw her. I think this may be due to the fact that she is having issues with not being able to sleep in her bed at least not until last night. She is therefore been in a lift chair  and subsequently has also had issues with not been able to use her pumps since she could not get in bed. With that being said the patient overall seems to be doing okay I do think I may want extend the antibiotic for a little bit longer at least until we can see if her edema and her weeping gets better and if it is then obviously I can always discontinue the antibiotics as of next week however I want her to continue to have it over the next week. 04/10/2019 on evaluation today patient unfortunately is still doing poorly with regard to her left lower extremity. Her right is all things considering doing fairly well. On the left however she continues to have spreading of the area of infection and weeping which appears to be even a larger surface area than noted last week. She did have a positive culture for Pseudomonas in particular which seems to have been of concern she still has green/yellow discharge consistent with Pseudomonas and subsequently a tremendous amount of it. This has me obviously still concerned about the infection not really clearing up despite the fact that on culture it appears the Cipro should have been a good option for treating this. I think she may at this point need IV antibiotics since things are not doing better I do not want to get worse and cause sepsis. She is in agreement with the plan and believes as well that she likely does need to go to the hospital for IV vancomycin. Or something of the like depending on what the recommendation is from the ER. 04/17/2019 on evaluation today patient appears to be doing excellent in regard to her lower extremity on the left. She was in  the hospital for several days from when I sent her last we saw her until just this past Tuesday. Fortunately her drainage is significantly improved and in fact is mostly clear. There is just a couple small areas that may still drain a little bit she states that the [facility] they prescribed for her at discharge she went picked up from pharmacy and got home but has not been able to find it since. She is looked everywhere. She is wondering if I will replace that for her today I will be more than happy to do that. 05/01/2019 on evaluation today patient actually appears to be doing quite well with regard to her lower extremities. She occasionally is having areas that will leak and then heal up mainly when a piece of the fibrotic skin pops off but fortunately she is not having any signs of active infection at this time. Overall she also really does not have any obvious weeping at this time. I do believe however she really needs some compression wraps and I think this may be a good time to get her back to the lymphedema clinic. 05/11/2019 on evaluation today patient actually appears to be doing quite well with regard to her bilateral lower extremities. She occasionally will have a small area that we per another but in general seems to be completely healed which is great news. Overall very pleased with how everything seems to be progressing. She does have her appointment with lymphedema clinic on November 18. 05/25/2019 on evaluation today patient appears to be doing well with regard to her left lower extremity. I am very pleased in this regard. In regard to her right leg this actually did start draining more I think it is mainly due to the fact that her leg is  more swollen. I am not seeing any obvious signs of infection at this time although that is definitely something were obviously acutely aware of simply due to the fact that she had an issue not too far back with exactly this issue. Nonetheless I do feel  like that lymphedema clinic would still be beneficial for her. I explained obviously if they are not able to do anything treatment wise on the right leg we could at least have them treat her left leg and then proceed from there. The patient is really in agreement with that plan. If they are able to do both as the drainage slows down that I would be happy to let them handle both. 06/01/2019 on evaluation today patient unfortunately appears to be doing worse with regard to her right lower extremity. The left lower extremity is still maintaining at this point. Unfortunately she has been having significantly increased pain over the past several days and has been experiencing as well increased swelling of the right lower extremity. I really do not know that I am seeing anything that appears to be obvious for infection at this point to be peripherally honest. With that being said the patient does seem to be having much more swelling that she is even experienced in the past and coupled with increased pain in her hip as well I am concerned that again she could potentially have a DVT although I am not 100% sure of this. I think it something that may need to be checked out. We discussed the possibility of sending her for a DVT study through the hospital but unfortunately transportation is an issue if she does have a DVT I do not want her to wait days to be able to get in for that test however if she has this scheduled as an outpatient that is as fast that she will be able to get the test scheduled for transportation purposes. That will also fall on Thanksgiving so subsequently she did actually be looking at either Friday or even next week before we would know anything back from this. That is much too long in my opinion. Subsequent to the amount of discomfort she is experiencing the patient is actually okay with going to the ER for evaluation today. 06/12/2019 on evaluation today patient actually appears to be  doing significantly better compared to last time I saw her. Following when I last saw her she was actually in the hospital from that Monday until the following Sunday almost 1 full week. She actually was placed on Keflex in the hospital following the time for her to be discharged and Dr. Steva Ready has recommended 2 times a day dosing of the Keflex for the next year in order to help with more prophylactic/preventative measures with regard to her developing cellulitis. Overall I think this sounds like an excellent plan. The patient unfortunately is good to have trouble being treated at lymphedema clinic due to the fact that she really cannot get up on the bed that they have there. They also state that they cannot manage her as long as she has anything draining at this point. Obviously that is somewhat unfortunate as she does need help with edema control but nonetheless we will have to do what we can for her outside of it sounds like the lymphedema clinic scenario at this point. 06/19/2019 on evaluation today patient appears to be doing fairly well with regard to her bilateral lower extremities. She is not nearly as swollen [name], [name] ([id number])  and shows no signs of infection at this point. There is no evidence of cellulitis whatsoever. She also has no open wounds or draining at this point which is also good news. No fever chills noted. She seems to be in very good spirits and in fact appears to be doing quite well. READMISSION 11/27/2019 This is a 66 year old woman that we have had in this clinic several times before including 2015, 16 and 19 and then most recently from 03/20/2019 through 06/19/2019 with bilateral lower extremity lymphedema. She has had previous arterial and reflux studies done years ago which were not all that remarkable. 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 She does have a positive family history and she had large  legs starting may be in her 67s. She was recently in hospital from 10/20/2019 through 10/28/2019 with right leg cellulitis. She was given Ancef and clindamycin and then Zosyn when a culture showed Pseudomonas. At that time there was purulent drainage. She was followed by infectious disease Dr Steva Ready. The patient is now back at home. She has noted increased swelling in the right and no drainage in her right leg mostly on the posterior medial aspect in the calf area. She has not had pain or fever. She has literally been improved lysing above dressings because her at the area of this is far too large for standard compression. She has been wrapping the areas with sheets to resorptive pads. She is found these helped somewhat. She does have an appointment with the lymphedema clinic in [place] in late June. Past medical history includes bilateral lymphedema, hypertension, obstructive sleep apnea with CPAP. Recent hospitalization with apparently Pseudomonas cellulitis of the right lower leg 12/15/2019 upon evaluation today patient appears to be doing a little bit worse in regard to her right lower extremity. Unfortunately she is having more weeping down in the lower portion of her leg. Fortunately there is no signs of active infection at this time. No fever chills noted. The patient states she is not having increased pain except for when she attempted to use the lymphedema pumps unfortunately she states that she did have pain when she did this. Otherwise we been using absorptive dressings of one type or another she is using diapers at home and then subsequently Ace wraps. In regard to the barrier cream we have discussed the possibility of derma cloud which she would like to try I do not have a problem with that. 12/22/2019 upon evaluation today patient actually appears to be doing better in regard to her leg ulcers at this point. Fortunately there does not appear to be any signs of active infection which  is great news and I am extremely pleased with where things are progressing at this time. There is no sign of active infection currently. The patient is very pleased to see things doing so well. 12/29/2019 upon evaluation today patient appears to be doing a little bit better in regard to her weeping in general over her lower extremities. She does have some signs of mild erythema little bit more than what I noted last week or rather last visit. Nonetheless I think that my threshold for switching her antibiotics from Keflex to something else is very low at this point considering that she has had such severe infections in the past that seem to come almost out of nowhere. There is a little erythema and warmth noted of the lower portion of her leg compared to the upper  which also makes me want to go ahead and address things more rapidly at this point. Likely I would switch out the Keflex for something like Levaquin ideally. 7/16; patient with severe bilateral lymphedema. She has superficial wounds albeit almost circumferential now on the left lateral lower leg. This may be new from last time. Small area on the right anterior lower leg and then another area on the right medial lower leg and of pannus fold. She has been using various absorptive garments. She states she is using her compression pumps once a day occasionally twice. Culture from her last visit here was negative 01/29/2020 on evaluation today patient appears to be doing excellent at this point in regard to her legs with regard to infection I see no signs of active infection at this point. She still does have unfortunately areas of weeping this is minimal on the right now her left is actually significantly worse although I do not think it is as bad as last week with Dr. Dellia Nims saw her. She has been trying to pump and elevate her legs is much as possible. She has previously been on the Keflex and in the past for prevention that seems to do fairly  well and likely can extend that today. 02/04/2020 on evaluation today patient appears to be doing better in regard to her legs bilaterally. Fortunately there is no signs of active infection at this time which is great news and overall she has less weeping on the left compared to the right and there is several spots where she is pretty much sealed up with no draining regions. Overall very pleased in this regard. 02/19/2020 on evaluation today patient appears to be doing very well in regard to her wounds currently. Fortunately there is no evidence of active infection overall very pleased with where things stand. She is significantly improved in regard to her edema I am extremely pleased in this regard she tells me that the popping no longer hurts and in fact she actually looks forward to it. 03/04/2020 on evaluation today patient appears to be doing excellent in regard to her lower extremities. Fortunately there is no signs of active infection at this time. No fevers, chills, nausea, vomiting, or diarrhea. 03/25/2020 on evaluation today patient appears to be doing a little bit more poorly in regard to her legs at this point. She tells me that she is still continue to have issues with drainage and this has been a little bit worse she was getting ready to start taking the Keflex again but wanted to see me first. Fortunately there is no signs of active infection at this time. No fevers, chills, nausea, vomiting, or diarrhea. 04/15/2020 upon evaluation today patient appears to be doing somewhat poorly in regard to her right leg. She tells me she has been having more pain she has been taking the Keflex that was previously prescribed unfortunately that just does not seem to help with this. She was hoping that the pain on her right was actually coming from the fact that she was having issues with her wrap having gotten caught in her recliner. With that being said she tells me that she knew something was not right.  Currently her right leg is warm to touch along with being erythematous all the way up to around at least mid thigh as far as I can see. The left leg does not appear to be doing that badly though there is increased weeping around the ankle region. 05/06/2020 on evaluation today  patient appears to be doing much better than last time I saw her. She did go to the hospital where she was admitted for 2 days and treated with antibiotic therapy. She was discharged with antibiotics as well and has done extremely well. I am extremely pleased with where things stand today. There is no signs of active infection at this time which is great news. 05/27/2020 upon evaluation today patient appears to be doing well with regard to her lower extremities bilaterally. She has just a very tiny area on the right leg which is opening on the left leg she is significantly improved though she still has several areas that do appear to be open this is minimal compared to what is been in the past. In general I am extremely pleased with where things stand today. The patient does tell me she is not been using her pumps quite as much as she should be. I do believe that is 1 area she can definitely work on. She has had a lot going on including a Covid exposure and apparently also a outbreak of likely shingles. ANHELICA, FOWERS ([id number]) 06/24/2020 upon evaluation today patient appears to be doing well with regard to her legs in general although the left leg unfortunately is showing some signs of erythema she does have a little bit of increased weeping and to be honest I am concerned about infection here. I discussed that with her today and I think that we may need to address this sooner rather than later she has been taking Keflex she is not really certain that is been making a big improvement however. No fevers, chills, nausea, vomiting, or diarrhea. 06/30/2020 upon evaluation today patient's legs actually seem to be doing better  in my opinion as compared to where they were last week. Fortunately there does not appear to be any signs of active infection. Her culture showed multiple organisms nothing predominate. With that being said the Levaquin seems to have done well I think she has improved since I last saw her as well. 07/14/2020 upon evaluation today patient appears to be doing actually better in regard to her lower extremities in my opinion. She has been tolerating the dressing changes without complication. Fortunately there is no signs of active infection at this time. 08/04/2020 upon evaluation today patient appears to be doing excellent in regard to her leg ulcers. Fortunately she has very little that is open at this point. This is great news. In fact I think that the Gold[employer] medicated powder has been excellent for her. It seems to have done the trick where we had tried several other things without as much success. Fortunately there is no evidence of active infection at this time. No fevers, chills, nausea, vomiting, or diarrhea. 09/01/2020 upon evaluation today patient appears to be doing a little bit more poorly than the last time I saw her. She tells me right now that she has been having a lot of drainage compared to where things were previous which has unfortunately led to more irritation as well. She is concerned that this is leading to infection. Again based on what I am seeing today as well I am also concerned of the same to be honest. 09/15/2020 upon evaluation today patient appears to be doing well with regard to her legs compared to what I saw previous. Fortunately there does not appear to be any evidence of active infection at this time which is great news. At least not as badly as what it was  previous. With that being said I do believe that the patient does need to have an extension of the antibiotics. This I believe will actually help her more in the way of making sure this stays under control and does not  worsen. That was the Bactrim DS. Readmission: 08/01/2021 upon evaluation today patient appears to be doing actually pretty well all things considered. Is actually been almost a year since have seen her last March. Fortunately I do not see any evidence of active infection locally nor systemically at this point. She does have a couple open areas on the left leg the right leg appears to be doing quite well. In general she has been in the hospital 3 times since I saw her a year ago all 4 cellulitis type issues except for the last 1 which was actually more related to congestive heart failure for that reason she is not using her lymphedema pumps at this point and that is probably the best idea. 08/08/2021 upon evaluation today patient appears to be doing well with regard to her legs all things considered her left leg may be a little bit more swollen based on what I see today. Fortunately I do not see any signs of active infection locally nor systemically at this time which is great news. No fevers, chills, nausea, vomiting, or diarrhea. 08/15/2021 upon evaluation today patient actually appears to be doing excellent in regard to her wounds. She has been tolerating the dressing changes without complication. Fortunately I see no evidence of infection currently which is great news. No fevers, chills, nausea, vomiting, or diarrhea. 08/29/2021 upon evaluation patient appears to be doing excellent she in fact is almost completely healed. I am actually very pleased with where we stand today and I think that she is making wonderful progress she just has a small area of weeping on the right lateral leg everything else is pretty much completely healed which is also. 09/05/2021 upon evaluation today patient appears to be doing well with regard to her legs. She does have a small area of weeping on the right leg and there is a small area on the left leg as well. It is potentially something that may need to be addressed in  the future more specifically but right now I think that there may have just been a little drainage here on the left. With all that being said I think that this still does not appear to be infected and overall I feel like she is doing quite well. That something we always have to keep a very close eye on as she can change very rapidly from okay to not okay. 09/12/2021 upon evaluation today patient appears to be doing a little bit worse in regard to her leg and actually somewhat concerned about the possibility of infection here. I discussed that with the patient. For that reason I am going to go ahead and see about getting her set up for a repeat prescription for the Bactrim. She is in agreement with that plan. 3/14; patient presents for follow-up. She has been using silver alginate with dressing changes. She is still taking Bactrim prescribed at last clinic visit. She has no issues or complaints today. She has lymphedema pumps however does not use these. 09/26/2020 upon evaluation today patient appears to actually be doing well in regard to her right leg I am pleased in that regard. Unfortunately she has new areas on her left leg that are open at this point that I do need to  be addressed. I am also concerned about the warmth around one of the wounds on the more anterior side. I think this is something that we may need to culture today potentially requiring antibiotics going forward. 10/03/2021 upon evaluation today patient unfortunately is not doing nearly as well as she was even last week with her wounds. She is having some issues here with increased cellulitis of the left lower extremity unfortunately. I did prescribe Augmentin for her eczema prescribed last week unfortunately she never actually received this prescription. The pharmacy stated they never got it. We double checked with them today they still did not receive it. I am not sure what happened in that regard. With that being said the patient is  in good spirits today she does not appear to be as sick as where she normally is when the cellulitis gets significantly worse as it has in the past. Nonetheless the leg is much more red not just around the ankle where I was seeing at last week on Tuesday but rather now this is going all the way up to almost her knee and is definitely hot to touch compared to the right leg. Nonetheless I feel like she does need to go to the ER for likely IV antibiotics. 10-10-2021 upon evaluation today patient appears to be doing well with regard to her wound. She has been tolerating the dressing changes that appears to be doing much better. After I saw her last week she did go to the ER they admitted her to the hospital and gave her IV antibiotics she fortunately is doing significantly better. Overall I am extremely pleased with where things stand today. 10-17-2021 upon evaluation today patient appears to be doing well with regard to her wound. In fact this is looking better and her leg is looking better but she still does have some areas here of erythema. We will continue to address this as soon as possible in my opinion. I do believe she may require some debridement and what appears to be a more defined wound on her left leg at this point. 10-24-2021 upon evaluation patient is definitely showing signs of improvement which is great news. I do not see any evidence of active infection [place], O'Brien. ([id number]) locally or systemically which is great news as well. No fevers, chills, nausea, vomiting, or diarrhea. 10-31-2021. Upon evaluation today patient's leg actually feels better from an infection and wound care perspective. I am actually very pleased in this regard. Unfortunately the biggest issue that I see at this time is that the patient is having a significant issue with her breathing. I did put her on the pulse ox machine to check her oxygen saturation and it was pretty much at 100% which is good but she tells me  that when she is walking this is much more difficult for her. She also tells me that when she is trying to bend over to perform her dressing changes she is so short of breath due to the swelling and fluid in her abdominal area that she is just not been able to do this. She is tearful and very concerned today to be honest. I am truly sorry to see her this way I know she is really having a hard time at the moment. 11-14-2021 upon evaluation patient actually appears to be doing significantly better compared to when I last saw her. She was actually admitted to the hospital I believe this for 6 days total after I sent her on 10-31-2021.  Subsequently they actually pulled off greater than 50 pounds of fluid she tells me she feels significantly better her legs are not as tight she actually has some play in the tissue and it is obvious that she is doing much better just from a mobility standpoint as well as a breathing standpoint. Overall I am extremely happy for her and how she is doing the leg also looks much better on the left. 11-21-2021 upon evaluation today patient appears to be doing well although it does appear that she is going require some sharp debridement the wound is very dry this is the opposite of what its been she was having so much weeping that it was staying extremely wet. Nonetheless we do need to see about going ahead and debriding the wound today. 11-28-2021 upon evaluation today patient appears to be doing well with regard to her wound I definitely see signs of things continuing to improve which is great news. I do not see any evidence of active infection locally or systemically also great news. 12-05-2021 upon evaluation today patient appears to be doing well with regard to her wound. She has been tolerating the dressing changes. Fortunately there does not appear to be any signs of active infection locally or systemically at this time. No fevers, chills, nausea, vomiting,  or diarrhea. 12-12-2021 upon evaluation patient appears to be doing well with regard to her wound this is actually measuring smaller and looking much better. Fortunately I do not see any signs of active infection locally or systemically at this time which is excellent news. 6/13; small wound area in the folds of her lymphedema just above the left ankle. Fortunately the area looks quite good. She has been using silver alginate ABDs and an Ace wrap which she is able to change her self. She is not using her compression pumps out of fear that this could contribute to heart failure. 12-26-2021 upon evaluation patient's wounds are actually showing signs of excellent improvement. I am very pleased with where things stand. Overall I think that she has been making excellent progress and I think we are very close to complete resolution which is great news. 01-11-2022 upon evaluation today patient appears to be doing well currently in regard to her legs in general although on the left leg she does have 1 area that still irritated and inflamed on the anterior portion of her leg. Fortunately I do not see any signs of systemic infection locally there might be some infection going on here however which is my biggest concern. Fortunately I do not see any evidence though of this spreading to any other location. 01-16-2022 upon evaluation today patient has actually showing signs of excellent improvement. Fortunately I do not see any evidence of infection locally or systemically which is great news and overall I am very pleased with where we stand at this point. 01-25-2022 upon evaluation today patient actually appears to be doing decently well in regard to her legs. She has been tolerating the dressing changes without complication. Fortunately there does not appear to be any evidence of active infection locally or systemically which is great news and overall I am extremely pleased with where we stand at this  point. 02-13-2022 upon evaluation today patient appears to be doing well currently in regard to her wound which is actually showing signs of significant improvement. Fortunately I do not see any evidence of active infection locally or systemically at this time. No fevers, chills, nausea, vomiting, or diarrhea. 02-20-2022 upon evaluation  today patient appears to be doing well currently in regard to her wound. She has been tolerating the dressing changes without complication. Fortunately there does not appear to be any signs of active infection locally or systemically at this time. No fevers, chills, nausea, vomiting, or diarrhea. 02-27-2022 upon evaluation today patient appears to be doing excellent in regard to her wounds on the leg. She has been tolerating the dressing changes without complication this is very close to complete resolution. Fortunately I do not see any signs of infection locally or systemically at this time which is great news. No fevers, chills, nausea, vomiting, or diarrhea. 03-06-2022 upon evaluation today patient appears to be doing well currently in regard to her wound in general. She has been tolerating the dressing changes without complication. Fortunately I do not see any evidence of active infection locally or systemically at this time which is great news. I am concerned a little bit however about not really her wounds but the second toe right foot where there appears to be some cellulitis after she dropped a frying pan on this about 2 weeks ago. She tells me its been itching and burning and I do think that it is possible she may have an infection based on what we are seeing currently. 03-20-2022 upon evaluation today patient actually appears to be doing much better at this point. Fortunately there does not appear to be any signs of active infection locally or systemically at this time which is great news. No fevers, chills, nausea, vomiting, or diarrhea. 03-27-2022 upon  evaluation today patient appears to be doing somewhat poorly in regard to her legs compared to previous. Her right leg has a couple areas that are open and she is very warm and painful to touch around the lateral portion of the ankle area. Left leg is showing signs of little bit more drainage and weeping as well I think that this is probably due to the fact that she is swelling a lot more that she has been in the past. Fortunately I do not see any signs of active infection systemically though locally there is definitely some issues here. 04-03-2022 upon evaluation today patient appears to be doing poorly in regard to her wounds on the legs unfortunately. She also seems to have more significant cellulitis that has ensued. Unfortunately she has been on doxycycline initially it seemed to be helping but as of this week that is no longer the case. It started getting worse last week and with the reinitiation of the doxycycline nothing has improved. Often when she gets like this IV antibiotics are necessary. [name], [name] ([id number]) Objective Constitutional Well-nourished and well-hydrated in no acute distress. Vitals Time Taken: 10:22 AM, Height: 63 in, Weight: 372 lbs, BMI: 65.9, Temperature: 98.1 F, Pulse: 87 bpm, Respiratory Rate: 18 breaths/min, Blood Pressure: 136/87 mmHg. Respiratory normal breathing without difficulty. Psychiatric this patient is able to make decisions and demonstrates good insight into disease process. Alert and Oriented x 3. pleasant and cooperative. General Notes: Upon inspection patient's wound bed actually showed signs of poor signs of healing at this point. I do not see any evidence of infection which is great news. She does not actually seem to have any issues with systemic infection but she is starting to feel poorly due to her legs and her legs are hurting pretty badly right now. Integumentary (Hair, Skin) Wound #10 status is Open. Original cause of wound was  Gradually Appeared. The date acquired was: 10/17/2021. The wound has  been in treatment 24 weeks. The wound is located on the Left,Medial,Anterior Lower Leg. The wound measures 1cm length x 2.5cm width x 0.1cm depth; 1.963cm^2 area and 0.196cm^3 volume. There is Fat Layer (Subcutaneous Tissue) exposed. There is a medium amount of serosanguineous drainage noted. The wound margin is indistinct and nonvisible. There is medium (34-66%) red granulation within the wound bed. There is a small (1-33%) amount of necrotic tissue within the wound bed. 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 Nurse Visit as needed Bathing/ Shower/ Hygiene: Clean wound with Normal Saline or wound cleanser. Wash wounds with antibacterial soap and water. May shower; gently cleanse wound with antibacterial soap, rinse and pat dry prior to dressing wounds No tub bath. Edema Control - Lymphedema / Segmental Compressive Device / Other: Ace wraps Elevate, Exercise Daily and Avoid Standing for Long Periods of Time. Elevate leg(s) parallel to the floor when sitting. DO YOUR BEST to sleep in the bed at night. DO NOT sleep in your recliner. Long hours of sitting in a recliner leads to swelling of the legs and/or potential wounds on your backside. Other: - use lymphedema pumps Non-Wound Condition: Cleanse affected area with antibacterial soap and water, Apply appropriate compression. - ace wrap Additional non-wound orders/instructions: - silver cell to open weeping areas with abd pad Medications-Please add to medication list.: P.O. Antibiotics - continue doxycycline as directed COSTELLA, SCHWARZ ([id number]) WOUND #10: - Lower Leg Wound Laterality: Left, Medial,  Anterior Cleanser: Soap and Water (Generic) Discharge Instructions: Gently cleanse wound with antibacterial soap, rinse and pat dry prior to dressing wounds Primary Dressing: SILVERCEL Antimicrobial Alginate Dressing, 1x12 (in/in) (Generic) Secondary Dressing: [place]uks Secured With: ACE WRAP - 33M ACE Elastic Bandage With VELCRO Brand Closure, 4 (in) (Generic) 1. Based on what I am seeing I would go ahead and recommend the patient should go to the ER for further evaluation and treatment of the bilateral lower extremity cellulitis. I do think that the silver cell is likely doing a good job as far as dressings are concerned I do not believe this is a dressing failure I think that this is currently simply just more of a bacterial infection that is not controlled with the doxycycline and often this indicates she is getting need IV antibiotics. Often vancomycin is used but she does have itching with this and so it has to be done in the hospital we can consider [facility] outpatient. 2. I am also can recommend based on what we are seeing currently that we have the patient go to the ER immediately upon leaving the clinic today I think that is can be her best option. We need to get this infection under control so does not continue to get worse we will get the infection under control I think she will actually be in a pretty good place as she does not really have any open wounds just more weeping and draining in general. We will see patient back for reevaluation in 1 week here in the clinic. If anything worsens or changes patient will contact our office for additional recommendations. Electronic Signature(s) Signed: 04/03/2022 10:59:34 AM By: Worthy Keeler PA-C Entered By: Worthy Keeler on 04/03/2022 10:59:34 Creason, [name] ([id number]) -------------------------------------------------------------------------------- SuperBill Details Patient Name: [name] Date of Service: 04/03/2022  Medical  Record Number: [id number] Patient Account Number: [id number] Date of Birth/Sex: 1955/07/22 (66 y.o. F) Treating RN: Cornell Barman Primary Care Provider: Ranae Plumber Other Clinician: Massie Kluver Referring Provider: Ranae Plumber Treating Provider/Extender: Skipper Cliche in Treatment: 35 Diagnosis Coding ICD-10 Codes Code Description Q82.0 Hereditary lymphedema L97.811 Non-pressure chronic ulcer of other part of right lower leg limited to breakdown of skin L97.821 Non-pressure chronic ulcer of other part of left lower leg limited to breakdown of skin Facility Procedures CPT4 Code: 04888916 Description: 99213 - WOUND CARE VISIT-LEV 3 EST PT Modifier: Quantity: 1 Physician Procedures CPT4 Code: 9450388 Description: 82800 - WC PHYS LEVEL 5 - EST PT Modifier: Quantity: 1 CPT4 Code: Description: ICD-10 Diagnosis Description Q82.0 Hereditary lymphedema L97.811 Non-pressure chronic ulcer of other part of right lower leg limited to bre L97.821 Non-pressure chronic ulcer of other part of left lower leg limited to brea Modifier: akdown of skin kdown of skin Quantity: Electronic Signature(s) Signed: 04/03/2022 11:00:05 AM By: Worthy Keeler PA-C Entered By: Worthy Keeler on 04/03/2022 11:00:05

## 2022-04-03 NOTE — Hospital Course (Signed)
Ms. [name] is a 66 year old with history of morbid obesity, hypertension, bilateral lower extremity lymphedema, chronic right lower extremity cellulitis, who presents emergency department for chief concerns of failing outpatient therapy for cellulitis.  Initial vitals in the emergency department showed temperature of 98.2, respiration rate of 18, heart rate of 77, blood pressure of 113/76, SPO2 of 93% on room air.  Serum sodium is 138, potassium 3.4, chloride 98, bicarb 27, BUN 41, serum creatinine of 1.48, eGFR 39, nonfasting blood glucose 101, WBC 7.0, hemoglobin 11.1, platelets of 172.  Lactic acid is 1.1.  ED treatment: None

## 2022-04-03 NOTE — Assessment & Plan Note (Signed)
Of the right lower extremity - Status post doxycycline outpatient treatment, currently on week 35 of treatment - Vancomycin and cefepime IV per pharmacy

## 2022-04-03 NOTE — Assessment & Plan Note (Signed)
-   Per patient this is genetics as her maternal uncles had it and a brother also has lymphedema

## 2022-04-03 NOTE — Consult Note (Signed)
Pharmacy Antibiotic Note  [name] is a 66 y.o. female admitted on 04/03/2022 with  worsening cellulitis s/p doxycycline therapy .  Patient has a history of chronic right lower extremity cellulitis and lymphedema and has been taking doxycycline scheduled However, since Friday, she endorsed bilateral lower extremity pain. Pharmacy has been consulted for Vancomycin and Cefepime dosing.  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 She has tolerated medication with slower infusion time in the past and will extend infusion time again during this admission.  Plan: Vancomcyin 2g IV x 1 as loading dose, followed by Vancomycin 1250 mg IV Q 24 hrs. Goal AUC 400-550. Expected AUC: 469.9 Expected Cmin: 14.0 SCr used: 1.48, Vd used: 0.5  Cefepime 2g IV Q8 hours   Height: '5\' 3"'$  (160 cm) Weight: (!) 176.9 kg (390 lb) IBW/kg (Calculated) : 52.4  Temp (24hrs), Avg:98.2 F (36.8 C), Min:98.2 F (36.8 C), Max:98.2 F (36.8 C)  Recent Labs  Lab 04/03/22 1129 04/03/22 1131  WBC 7.0  --   CREATININE 1.48*  --   LATICACIDVEN 1.1 1.5    Estimated Creatinine Clearance: 60.3 mL/min (A) (by C-G formula based on SCr of 1.48 mg/dL (H)).    Allergies  Allergen Reactions   Ace Inhibitors Hives and Swelling   Ibuprofen Hives and Swelling   Vancomycin Itching and Nausea And Vomiting    Antimicrobials this admission: Vancomycin 9/26 >>  Cefepime 9/26 >>   Dose adjustments this admission: N/A  Microbiology results: No cultures currently ordered.  Thank you for allowing pharmacy to be a part of this patient's care.  Pearla Dubonnet 04/03/2022 2:58 PM

## 2022-04-03 NOTE — Assessment & Plan Note (Signed)
-   Right leg is warm compared to the left

## 2022-04-03 NOTE — H&P (Signed)
History and Physical   [name] [id number]:[id number] DOB: July 07, 1956 DOA: 04/03/2022  PCP: Ranae Plumber, PA  Patient coming from: Wound clinic  I have personally briefly reviewed patient's old medical records in [place].  Chief Concern: Cellulitis failing outpatient therapy  HPI: [name] is a 66 year old with history of morbid obesity, hypertension, bilateral lower extremity lymphedema, chronic right lower extremity cellulitis, who presents emergency department for chief concerns of failing outpatient therapy for cellulitis.  Initial vitals in the emergency department showed temperature of 98.2, respiration rate of 18, heart rate of 77, blood pressure of 113/76, SPO2 of 93% on room air.  Serum sodium is 138, potassium 3.4, chloride 98, bicarb 27, BUN 41, serum creatinine of 1.48, eGFR 39, nonfasting blood glucose 101, WBC 7.0, hemoglobin 11.1, platelets of 172.  Lactic acid is 1.1.  ED treatment: None -------------- At bedside, she is able to tell me her name, age, current location, current year.   She reports that since Friday, she has been having bilateral lower extremity pain (pins and needles) and chills. She denies purulence discharge. She endorses increase odor. She reports the pain is worse this week than in March of this year.  She reports the swelling is worse and the burning sensation is worse.  She reports that she ambulates.  She denies fever, cough, chest pain, abdominal pain, dysuria, hematuria, diarrhea.  She endorses chills.  Social history: She lives with her son. She uses snuff occassionally. She denies etoh and recreational drug use.   ROS: Constitutional: no weight change, no fever ENT/Mouth: no sore throat, no rhinorrhea Eyes: no eye pain, no vision changes Cardiovascular: no chest pain, no dyspnea,  + edema, no palpitations Respiratory: no cough, no sputum, no wheezing Gastrointestinal: no nausea, no vomiting, no diarrhea, no  constipation Genitourinary: no urinary incontinence, no dysuria, no hematuria Musculoskeletal: no arthralgias, no myalgias Skin: + skin lesions, no pruritus, Neuro: no weakness, no loss of consciousness, no syncope Psych: no anxiety, no depression, no decrease appetite Heme/Lymph: no bruising, no bleeding  ED Course: Discussed with emergency medicine provider, patient requiring hospitalization for chief concerns of cellulitis failing outpatient therapy.  Assessment/Plan  Principal Problem:   Cellulitis Active Problems:   Lymphedema   OSA (obstructive sleep apnea)   Tobacco use   Cellulitis of right leg   HTN (hypertension)   Morbid obesity with BMI of 60.0-69.9, adult (HCC)   Bilateral leg pain   Assessment and Plan:  * Cellulitis Of the right lower extremity - Status post doxycycline outpatient treatment, currently on week 35 of treatment - Vancomycin and cefepime IV per pharmacy  Lymphedema - Per patient this is genetics as her maternal uncles had it and a brother also has lymphedema  Bilateral leg pain - Given that patient endorses the pain is worse compared to March 2023, I have ordered bilateral lower extremity ultrasound to assess for DVT  Morbid obesity with BMI of 60.0-69.9, adult ([facility]) - Patient has BMI of 69. This complicates overall care and prognosis.   Cellulitis of right leg - Right leg is warm compared to the left  Chart reviewed.   DVT prophylaxis: Enoxaparin Code Status: full code  Diet: heart healthy Family Communication: Phone call was offered, patient declined and states that she has updated her family Disposition Plan: Pending clinical course Consults called: None at this time Admission status: MedSurg, observation  Past Medical History:  Diagnosis Date   Arthritis    Asthma    CHF (  congestive heart failure) ([place])    Enlarged heart    Hypertension    Lymphedema    Sleep apnea    Spinal stenosis    Past Surgical History:  Procedure  Laterality Date   COLONOSCOPY WITH PROPOFOL N/A 03/12/2018   Procedure: COLONOSCOPY WITH PROPOFOL;  Surgeon: Manya Silvas, MD;  Location: [facility] ENDOSCOPY;  Service: Endoscopy;  Laterality: N/A;   NO PAST SURGERIES     Social History:  reports that she quit smoking about 31 years ago. Her smoking use included cigarettes. She started smoking about 45 years ago. She smoked an average of .25 packs per day. Her smokeless tobacco use includes snuff. She reports that she does not drink alcohol and does not use drugs.  Allergies  Allergen Reactions   Ace Inhibitors Hives and Swelling   Ibuprofen Hives and Swelling   Vancomycin Itching and Nausea And Vomiting   Family History  Problem Relation Age of Onset   Breast cancer Maternal Aunt    Thyroid disease Other    Family history: Family history reviewed and pertinent lymphedema in her 2 maternal uncles and a brother.  Prior to Admission medications   Medication Sig Start Date End Date Taking? Authorizing Provider  acetaminophen (TYLENOL) 500 MG tablet Take 500-1,000 mg by mouth every 6 (six) hours as needed for mild pain or fever.   Yes [provider]  atenolol (TENORMIN) 25 MG tablet Take 1 tablet (25 mg total) by mouth daily. 11/06/21  Yes Enzo Bi, MD  bumetanide (BUMEX) 2 MG tablet Take 1 tablet (2 mg total) by mouth daily. 03/06/22  Yes Hackney, Otila Kluver A, FNP  cetirizine (ZYRTEC) 10 MG tablet Take 1 tablet (10 mg total) by mouth daily. 12/25/20  Yes Lavina Hamman, MD  cyanocobalamin (VITAMIN B12) 500 MCG tablet Take 500 mcg by mouth daily.   Yes [provider]  doxycycline (VIBRAMYCIN) 100 MG capsule Take 100 mg by mouth 2 (two) times daily. 01/11/22  Yes [provider]  NYSTATIN powder Apply topically. 03/19/22  Yes [provider]  potassium chloride SA (KLOR-CON M) 20 MEQ tablet Take 2 tablets (40 mEq total) by mouth daily. 11/28/21  Yes Hackney, Otila Kluver A, FNP  pregabalin (LYRICA) 100 MG capsule Take 100 mg  by mouth 2 (two) times daily.   Yes [provider]  senna-docusate (SENOKOT-S) 8.6-50 MG tablet Take 1 tablet by mouth 2 (two) times daily.   Yes [provider]  FUROSCIX 80 MG/10ML CTKT Inject into the skin. 01/15/22   [provider]  metolazone (ZAROXOLYN) 2.5 MG tablet Take 1 tab every Tue and Sat 30 minutes prior Lasix. Patient not taking: Reported on 04/03/2022 11/06/21   Enzo Bi, MD  metolazone (ZAROXOLYN) 5 MG tablet Take 1 tablet by mouth daily. 03/27/22   [provider]   Physical Exam: Vitals:   04/03/22 1122 04/03/22 1127 04/03/22 1354  BP:  113/76 (!) 104/52  Pulse:  77 69  Resp:  18 16  Temp:  98.2 F (36.8 C)   TempSrc:  Oral   SpO2:  93% 100%  Weight: (!) 176.9 kg    Height: $Remove'5\' 3"'bnrbYgX$  (1.6 m)     Constitutional: appears older than chronological age, NAD, calm, comfortable Eyes: PERRL, lids and conjunctivae normal ENMT: Mucous membranes are moist. Posterior pharynx clear of any exudate or lesions. Age-appropriate dentition. Hearing appropriate Neck: normal, supple, no masses, no thyromegaly Respiratory: clear to auscultation bilaterally, no wheezing, no crackles. Normal respiratory effort.  No accessory muscle use.  Cardiovascular: Regular rate and rhythm, no murmurs / rubs / gallops.  Bilateral lower extremity lymphedema.  2+ pedal pulses full to appreciate. No carotid bruits.  Abdomen: no tenderness, no masses palpated, no hepatosplenomegaly. Bowel sounds positive.  Musculoskeletal: no clubbing / cyanosis. No joint deformity upper and lower extremities. Good ROM, no contractures, no atrophy. Normal muscle tone. Right leg is warm compared to the left. Skin: Bilateral lower extremity small ulcers. + induration Neurologic: Sensation intact. Strength 5/5 in all 4.  Psychiatric: Normal judgment and insight. Alert and oriented x 3. Normal mood.   EKG: Not indicated on admission Chest x-ray on Admission: Not indicated on admission Labs on  Admission: I have personally reviewed following labs  CBC: Recent Labs  Lab 04/03/22 1129  WBC 7.0  NEUTROABS 4.5  HGB 11.1*  HCT 36.0  MCV 84.3  PLT 825   Basic Metabolic Panel: Recent Labs  Lab 04/03/22 1129  NA 138  K 3.4*  CL 98  CO2 27  GLUCOSE 101*  BUN 41*  CREATININE 1.48*  CALCIUM 8.6*  MG 1.7  PHOS 3.6   GFR: Estimated Creatinine Clearance: 60.3 mL/min (A) (by C-G formula based on SCr of 1.48 mg/dL (H)).  Liver Function Tests: Recent Labs  Lab 04/03/22 1129  AST 18  ALT 11  ALKPHOS 108  BILITOT 0.9  PROT 8.6*  ALBUMIN 3.4*   Urine analysis:    Component Value Date/Time   COLORURINE YELLOW (A) 10/03/2021 1255   APPEARANCEUR HAZY (A) 10/03/2021 1255   APPEARANCEUR Clear 05/18/2013 1725   LABSPEC 1.024 10/03/2021 1255   LABSPEC 1.011 05/18/2013 1725   PHURINE 5.0 10/03/2021 1255   GLUCOSEU NEGATIVE 10/03/2021 1255   GLUCOSEU Negative 05/18/2013 [place] 10/03/2021 1255   BILIRUBINUR NEGATIVE 10/03/2021 1255   BILIRUBINUR Negative 05/18/2013 1725   KETONESUR NEGATIVE 10/03/2021 1255   PROTEINUR 30 (A) 10/03/2021 1255   NITRITE NEGATIVE 10/03/2021 1255   LEUKOCYTESUR NEGATIVE 10/03/2021 1255   LEUKOCYTESUR Negative 05/18/2013 1725   Dr. Tobie Poet [facility]  If 7PM-7AM, please contact overnight-coverage provider If 7AM-7PM, please contact day coverage provider [email or web address]  04/03/2022, 2:38 PM

## 2022-04-03 NOTE — ED Notes (Signed)
Pt having allergic rxn to vanco. Pt endorsing this has happened before and that it is still prescribed to her. Redness, itchiness, and hives noted to L arm proximal to IV site. Vanco paused at this time, will notify provider.   PRN benadryl on chart, given at 1558 and is only Q6 PRN.

## 2022-04-04 ENCOUNTER — Encounter: Payer: Self-pay | Admitting: Internal Medicine

## 2022-04-04 DIAGNOSIS — I130 Hypertensive heart and chronic kidney disease with heart failure and stage 1 through stage 4 chronic kidney disease, or unspecified chronic kidney disease: Secondary | ICD-10-CM | POA: Diagnosis not present

## 2022-04-04 DIAGNOSIS — I890 Lymphedema, not elsewhere classified: Secondary | ICD-10-CM | POA: Diagnosis not present

## 2022-04-04 DIAGNOSIS — L03115 Cellulitis of right lower limb: Principal | ICD-10-CM

## 2022-04-04 DIAGNOSIS — Z803 Family history of malignant neoplasm of breast: Secondary | ICD-10-CM | POA: Diagnosis not present

## 2022-04-04 DIAGNOSIS — M79604 Pain in right leg: Secondary | ICD-10-CM | POA: Diagnosis not present

## 2022-04-04 DIAGNOSIS — Z79899 Other long term (current) drug therapy: Secondary | ICD-10-CM | POA: Diagnosis not present

## 2022-04-04 DIAGNOSIS — N1831 Chronic kidney disease, stage 3a: Secondary | ICD-10-CM | POA: Diagnosis not present

## 2022-04-04 DIAGNOSIS — F17290 Nicotine dependence, other tobacco product, uncomplicated: Secondary | ICD-10-CM | POA: Diagnosis not present

## 2022-04-04 DIAGNOSIS — I5032 Chronic diastolic (congestive) heart failure: Secondary | ICD-10-CM | POA: Diagnosis not present

## 2022-04-04 DIAGNOSIS — I959 Hypotension, unspecified: Secondary | ICD-10-CM | POA: Diagnosis not present

## 2022-04-04 DIAGNOSIS — M79605 Pain in left leg: Secondary | ICD-10-CM | POA: Diagnosis not present

## 2022-04-04 DIAGNOSIS — G4733 Obstructive sleep apnea (adult) (pediatric): Secondary | ICD-10-CM | POA: Diagnosis not present

## 2022-04-04 DIAGNOSIS — X58XXXA Exposure to other specified factors, initial encounter: Secondary | ICD-10-CM | POA: Diagnosis present

## 2022-04-04 DIAGNOSIS — L0390 Cellulitis, unspecified: Secondary | ICD-10-CM | POA: Diagnosis present

## 2022-04-04 DIAGNOSIS — S81011A Laceration without foreign body, right knee, initial encounter: Secondary | ICD-10-CM | POA: Diagnosis not present

## 2022-04-04 DIAGNOSIS — Z6841 Body Mass Index (BMI) 40.0 and over, adult: Secondary | ICD-10-CM | POA: Diagnosis not present

## 2022-04-04 DIAGNOSIS — E876 Hypokalemia: Secondary | ICD-10-CM | POA: Diagnosis not present

## 2022-04-04 DIAGNOSIS — Z888 Allergy status to other drugs, medicaments and biological substances status: Secondary | ICD-10-CM | POA: Diagnosis not present

## 2022-04-04 DIAGNOSIS — Z881 Allergy status to other antibiotic agents status: Secondary | ICD-10-CM | POA: Diagnosis not present

## 2022-04-04 LAB — BASIC METABOLIC PANEL
Anion gap: 10 (ref 5–15)
BUN: 38 mg/dL — ABNORMAL HIGH (ref 8–23)
CO2: 28 mmol/L (ref 22–32)
Calcium: 7.9 mg/dL — ABNORMAL LOW (ref 8.9–10.3)
Chloride: 102 mmol/L (ref 98–111)
Creatinine, Ser: 1.56 mg/dL — ABNORMAL HIGH (ref 0.44–1.00)
GFR, Estimated: 36 mL/min — ABNORMAL LOW (ref >60)
Glucose, Bld: 97 mg/dL (ref 70–99)
Potassium: 3.2 mmol/L — ABNORMAL LOW (ref 3.5–5.1)
Sodium: 140 mmol/L (ref 135–145)

## 2022-04-04 LAB — CBC
HCT: 32.2 % — ABNORMAL LOW (ref 36.0–46.0)
Hemoglobin: 9.8 g/dL — ABNORMAL LOW (ref 12.0–15.0)
MCH: 25.9 pg — ABNORMAL LOW (ref 26.0–34.0)
MCHC: 30.4 g/dL (ref 30.0–36.0)
MCV: 85.2 fL (ref 80.0–100.0)
Platelets: 151 10*3/uL (ref 150–400)
RBC: 3.78 MIL/uL — ABNORMAL LOW (ref 3.87–5.11)
RDW: 14.3 % (ref 11.5–15.5)
WBC: 5.6 10*3/uL (ref 4.0–10.5)
nRBC: 0 % (ref 0.0–0.2)

## 2022-04-04 LAB — HIV ANTIBODY (ROUTINE TESTING W REFLEX): HIV Screen 4th Generation wRfx: NONREACTIVE

## 2022-04-04 LAB — MAGNESIUM: Magnesium: 1.5 mg/dL — ABNORMAL LOW (ref 1.7–2.4)

## 2022-04-04 MED ORDER — ALBUTEROL SULFATE (2.5 MG/3ML) 0.083% IN NEBU
2.5000 mg | INHALATION_SOLUTION | Freq: Four times a day (QID) | RESPIRATORY_TRACT | Status: DC | PRN
  Administered 2022-04-04 – 2022-04-07 (×2): 2.5 mg via RESPIRATORY_TRACT
  Filled 2022-04-04 (×2): qty 3

## 2022-04-04 MED ORDER — MAGNESIUM SULFATE 2 GM/50ML IV SOLN
2.0000 g | Freq: Once | INTRAVENOUS | Status: AC
  Administered 2022-04-04: 2 g via INTRAVENOUS
  Filled 2022-04-04: qty 50

## 2022-04-04 MED ORDER — ALBUTEROL SULFATE HFA 108 (90 BASE) MCG/ACT IN AERS: 1.0000 | INHALATION_SPRAY | Freq: Four times a day (QID) | RESPIRATORY_TRACT | Status: DC | PRN

## 2022-04-04 MED ORDER — POTASSIUM CHLORIDE CRYS ER 20 MEQ PO TBCR
40.0000 meq | EXTENDED_RELEASE_TABLET | Freq: Every day | ORAL | Status: DC
  Administered 2022-04-04 – 2022-04-05 (×2): 40 meq via ORAL
  Filled 2022-04-04 (×2): qty 2

## 2022-04-04 NOTE — Progress Notes (Signed)
Progress Note    [name]  IOX:[id number] DOB: 1955-12-20  DOA: 04/03/2022 PCP: Ranae Plumber, PA      Brief Narrative:    Medical records reviewed and are as summarized below:  [name] is a 66 y.o. female with history of morbid obesity, hypertension, bilateral lower extremity lymphedema, chronic right lower extremity cellulitis, who presents emergency department for chief concerns of failing outpatient therapy for cellulitis.  She said she had finished a 14-day course of doxycycline about 3 weeks prior to admission.  She was started on a 30-day course of doxycycline a week prior to admission.  However, she has not seen any improvement in her "cellulitis".  She was referred to the emergency department from the wound care clinic for further management of cellulitis having failed outpatient antibiotics.  She said that she noticed warmth and increasing redness with drainage particularly on the right leg.       Assessment/Plan:   Principal Problem:   Cellulitis Active Problems:   Lymphedema   OSA (obstructive sleep apnea)   Tobacco use   Cellulitis of right leg   HTN (hypertension)   Morbid obesity with BMI of 60.0-69.9, adult (HCC)   Bilateral leg pain   Body mass index is 69.09 kg/m.  (Morbid obesity)  Bilateral lower extremity lymphedema with right leg cellulitis: Continue IV vancomycin.  Patient said she is tolerating IV vancomycin thus far.  Discontinue IV cefepime.  No evidence of DVT on venous duplex of the lower extremities.  Hypokalemia: Replete potassium with oral potassium chloride  Hypomagnesemia: Replete magnesium with IV magnesium sulfate and monitor electrolytes  Chronic diastolic CHF: Continue Bumex.  Hypotension with history of hypertension: Atenolol was held today.  Monitor BP closely.  OSA: Use CPAP at night  Diet Order             Diet Heart Room service appropriate? Yes; Fluid consistency: Thin  Diet effective now                             Consultants: None  Procedures: None    Medications:    atenolol  25 mg Oral Daily   bumetanide  2 mg Oral Daily   cyanocobalamin  500 mcg Oral Daily   enoxaparin (LOVENOX) injection  0.5 mg/kg Subcutaneous Q24H   potassium chloride SA  40 mEq Oral Daily   pregabalin  100 mg Oral BID   senna-docusate  1 tablet Oral Daily   Continuous Infusions:  vancomycin       Anti-infectives (From admission, onward)    Start     Dose/Rate Route Frequency Ordered Stop   04/04/22 1800  vancomycin (VANCOREADY) IVPB 1250 mg/250 mL        1,250 mg 50 mL/hr over 180 Minutes Intravenous Every 24 hours 04/03/22 1611     04/03/22 1400  ceFEPIme (MAXIPIME) 2 g in sodium chloride 0.9 % 100 mL IVPB  Status:  Discontinued        2 g 200 mL/hr over 30 Minutes Intravenous Every 8 hours 04/03/22 1330 04/04/22 1157   04/03/22 1345  vancomycin (VANCOREADY) IVPB 2000 mg/400 mL        2,000 mg 100 mL/hr over 240 Minutes Intravenous  Once 04/03/22 1330 04/04/22 0330              Family Communication/Anticipated D/C date and plan/Code Status   DVT prophylaxis: Place TED hose Start: 04/03/22 1259  Code Status: Full Code  Family Communication: None Disposition Plan: Plan to discharge home in 1 to 2 days   Status is: Inpatient Remains inpatient appropriate because: IV antibiotics for cellulitis of the right leg       Subjective:   She complains of pain in bilateral legs.  Pain is worse in the right leg.  Objective:    Vitals:   04/04/22 0858 04/04/22 0950 04/04/22 1028 04/04/22 1058  BP: (!) 96/57 (!) 97/54  (!) 97/53  Pulse: 84 78  72  Resp: (!) '22 20 20 18  '$ Temp: 98.6 F (37 C) 98.7 F (37.1 C)  98.5 F (36.9 C)  TempSrc: Oral Oral  Oral  SpO2: 95%   95%  Weight:      Height:       No data found.   Intake/Output Summary (Last 24 hours) at 04/04/2022 1504 Last data filed at 04/04/2022 1408 Gross per 24 hour  Intake 1460 ml   Output 2250 ml  Net -790 ml   Filed Weights   04/03/22 1122  Weight: (!) 176.9 kg    Exam:  GEN: NAD SKIN: Warm and dry EYES: EOMI ENT: MMM CV: RRR PULM: CTA B ABD: soft, obese, NT, +BS CNS: AAO x 3, non focal EXT: Bilateral lower extremity lymphedema with erythematous changes on distal legs.         Data Reviewed:   I have personally reviewed following labs and imaging studies:  Labs: Labs show the following:   Basic Metabolic Panel: Recent Labs  Lab 04/03/22 1129 04/04/22 0435  NA 138 140  K 3.4* 3.2*  CL 98 102  CO2 27 28  GLUCOSE 101* 97  BUN 41* 38*  CREATININE 1.48* 1.56*  CALCIUM 8.6* 7.9*  MG 1.7 1.5*  PHOS 3.6  --    GFR Estimated Creatinine Clearance: 57.2 mL/min (A) (by C-G formula based on SCr of 1.56 mg/dL (H)). Liver Function Tests: Recent Labs  Lab 04/03/22 1129  AST 18  ALT 11  ALKPHOS 108  BILITOT 0.9  PROT 8.6*  ALBUMIN 3.4*   No results for input(s): "LIPASE", "AMYLASE" in the last 168 hours. No results for input(s): "AMMONIA" in the last 168 hours. Coagulation profile No results for input(s): "INR", "PROTIME" in the last 168 hours.  CBC: Recent Labs  Lab 04/03/22 1129 04/04/22 0435  WBC 7.0 5.6  NEUTROABS 4.5  --   HGB 11.1* 9.8*  HCT 36.0 32.2*  MCV 84.3 85.2  PLT 172 151   Cardiac Enzymes: No results for input(s): "CKTOTAL", "CKMB", "CKMBINDEX", "TROPONINI" in the last 168 hours. BNP (last 3 results) No results for input(s): "PROBNP" in the last 8760 hours. CBG: No results for input(s): "GLUCAP" in the last 168 hours. D-Dimer: No results for input(s): "DDIMER" in the last 72 hours. Hgb A1c: No results for input(s): "HGBA1C" in the last 72 hours. Lipid Profile: No results for input(s): "CHOL", "HDL", "LDLCALC", "TRIG", "CHOLHDL", "LDLDIRECT" in the last 72 hours. Thyroid function studies: No results for input(s): "TSH", "T4TOTAL", "T3FREE", "THYROIDAB" in the last 72 hours.  Invalid input(s):  "FREET3" Anemia work up: No results for input(s): "VITAMINB12", "FOLATE", "FERRITIN", "TIBC", "IRON", "RETICCTPCT" in the last 72 hours. Sepsis Labs: Recent Labs  Lab 04/03/22 1129 04/03/22 1131 04/04/22 0435  WBC 7.0  --  5.6  LATICACIDVEN 1.1 1.5  --     Microbiology No results found for this or any previous visit (from the past 240 hour(s)).  Procedures and diagnostic studies:  US Venous Img Lower Bilateral (DVT)  Result Date: 04/03/2022 CLINICAL DATA:  Leg swelling. EXAM: BILATERAL LOWER EXTREMITY VENOUS DOPPLER ULTRASOUND TECHNIQUE: Gray-scale sonography with graded compression, as well as color Doppler and duplex ultrasound were performed to evaluate the lower extremity deep venous systems from the level of the common femoral vein and including the common femoral, femoral, profunda femoral, popliteal and calf veins including the posterior tibial, peroneal and gastrocnemius veins when visible. The superficial great saphenous vein was also interrogated. Spectral Doppler was utilized to evaluate flow at rest and with distal augmentation maneuvers in the common femoral, femoral and popliteal veins. COMPARISON:  Lower extremity venous Doppler ultrasound 10/03/2021. FINDINGS: RIGHT LOWER EXTREMITY Common Femoral Vein: No evidence of thrombus. Normal compressibility, respiratory phasicity and response to augmentation. Saphenofemoral Junction: No evidence of thrombus. Normal compressibility and flow on color Doppler imaging. Profunda Femoral Vein: No evidence of thrombus. Normal compressibility and flow on color Doppler imaging. Femoral Vein: No evidence of thrombus. Normal compressibility, respiratory phasicity and response to augmentation. Popliteal Vein: No evidence of thrombus. Normal compressibility, respiratory phasicity and response to augmentation. Calf Veins: No evidence of thrombus. Normal compressibility and flow on color Doppler imaging. Superficial Great Saphenous Vein: No evidence of  thrombus. Normal compressibility. Venous Reflux:  None. Other Findings: Examination is limited/suboptimal secondary to body habitus and severe soft tissue swelling. LEFT LOWER EXTREMITY Common Femoral Vein: No evidence of thrombus. Normal compressibility, respiratory phasicity and response to augmentation. Saphenofemoral Junction: No evidence of thrombus. Normal compressibility and flow on color Doppler imaging. Profunda Femoral Vein: No evidence of thrombus. Normal compressibility and flow on color Doppler imaging. Femoral Vein: No evidence of thrombus. Normal compressibility, respiratory phasicity and response to augmentation. Popliteal Vein: No evidence of thrombus. Normal compressibility, respiratory phasicity and response to augmentation. Calf Veins: No evidence of thrombus. Normal compressibility and flow on color Doppler imaging. Superficial Great Saphenous Vein: No evidence of thrombus. Normal compressibility. Venous Reflux:  None. Other Findings: Examination is limited/suboptimal secondary to body habitus and severe soft tissue swelling. IMPRESSION: Limited examination. No evidence of deep venous thrombosis in either lower extremity. Electronically Signed   By: Ronney Asters M.D.   On: 04/03/2022 15:51               LOS: 0 days   Kadarius Cuffe  [facility]   Pager on [email or web address]. If 7PM-7AM, please contact night-coverage at [email or web address]     04/04/2022, 3:04 PM

## 2022-04-04 NOTE — Plan of Care (Signed)
  Problem: Clinical Measurements: Goal: Ability to maintain clinical measurements within normal limits will improve Outcome: Progressing   Problem: Clinical Measurements: Goal: Will remain free from infection Outcome: Progressing   Problem: Clinical Measurements: Goal: Diagnostic test results will improve Outcome: Progressing   

## 2022-04-04 NOTE — Progress Notes (Signed)
Patient lives at home with her adult children Has DME at home quad;BSC/3in1;Grab bars - tub/shower;Hand held shower head;Grab bars - toilet;Hospital bed;Geneticist, molecular (4 wheels) Lift chair/recliner  Will need EMS to transport home Has had [name] in the past for [facility] Was going to the Wound care center for treatment for the bilateral lower extremity cellulitis she had finished a 14-day course of doxycycline about 3 weeks prior to admission.  She was started on a 30-day course of doxycycline a week prior to admission.  However, she has not seen any improvement in her "cellulitis".  She was referred to the emergency department from the wound care clinic for further management of cellulitis having failed outpatient antibiotics.  TO to follow for additional needs

## 2022-04-04 NOTE — Consult Note (Signed)
Pharmacy Antibiotic Note  [name] is a 66 y.o. female admitted on 04/03/2022 with  worsening cellulitis s/p doxycycline therapy .  Patient has a history of chronic right lower extremity cellulitis and lymphedema and has been taking doxycycline scheduled However, since Friday, she endorsed bilateral lower extremity pain. Pharmacy has been consulted for Vancomycin and Cefepime dosing.  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 She has tolerated medication with slower infusion time in the past and will extend infusion time again during this admission.  On 9/26 experienced infusion reaction symptoms. Infusion completed after slowing rate to 50 mL/hr  Plan: Vancomycin 1250 mg IV Q 24 hrs '@50'$  mL/hr.  Goal AUC 400-550. Expected AUC: 491.5 Expected Cmin: 14.8, Cpk 28.3 SCr used: 1.56, Vd used: 0.5  Cefepime 2 grams IV every 8 hours  Follow up renal function and clinical course.  Height: '5\' 3"'$  (160 cm) Weight: (!) 176.9 kg (390 lb) IBW/kg (Calculated) : 52.4  Temp (24hrs), Avg:98.3 F (36.8 C), Min:97.9 F (36.6 C), Max:98.9 F (37.2 C)  Recent Labs  Lab 04/03/22 1129 04/03/22 1131 04/04/22 0435  WBC 7.0  --  5.6  CREATININE 1.48*  --  1.56*  LATICACIDVEN 1.1 1.5  --      Estimated Creatinine Clearance: 57.2 mL/min (A) (by C-G formula based on SCr of 1.56 mg/dL (H)).    Allergies  Allergen Reactions   Ace Inhibitors Hives and Swelling   Ibuprofen Hives and Swelling   Vancomycin Itching and Nausea And Vomiting    Antimicrobials this admission: Vancomycin 9/26 >>  Cefepime 9/26 >>   Dose adjustments this admission: N/A  Microbiology results: No cultures currently ordered.  Thank you for allowing pharmacy to be a part of this patient's care.   Glean Salvo, PharmD Clinical Pharmacist  04/04/2022 9:23 AM

## 2022-04-05 DIAGNOSIS — L03115 Cellulitis of right lower limb: Secondary | ICD-10-CM | POA: Diagnosis not present

## 2022-04-05 DIAGNOSIS — L0390 Cellulitis, unspecified: Secondary | ICD-10-CM | POA: Diagnosis not present

## 2022-04-05 DIAGNOSIS — I890 Lymphedema, not elsewhere classified: Secondary | ICD-10-CM | POA: Diagnosis not present

## 2022-04-05 DIAGNOSIS — E876 Hypokalemia: Secondary | ICD-10-CM | POA: Diagnosis not present

## 2022-04-05 DIAGNOSIS — I959 Hypotension, unspecified: Secondary | ICD-10-CM

## 2022-04-05 LAB — SURGICAL PCR SCREEN
MRSA, PCR: POSITIVE — AB
Staphylococcus aureus: POSITIVE — AB

## 2022-04-05 LAB — MAGNESIUM: Magnesium: 1.8 mg/dL (ref 1.7–2.4)

## 2022-04-05 LAB — CREATININE, SERUM
Creatinine, Ser: 1.29 mg/dL — ABNORMAL HIGH (ref 0.44–1.00)
GFR, Estimated: 46 mL/min — ABNORMAL LOW (ref >60)

## 2022-04-05 LAB — POTASSIUM: Potassium: 3.1 mmol/L — ABNORMAL LOW (ref 3.5–5.1)

## 2022-04-05 MED ORDER — BUMETANIDE 1 MG PO TABS
2.0000 mg | ORAL_TABLET | Freq: Every day | ORAL | Status: DC
  Administered 2022-04-06 – 2022-04-07 (×2): 2 mg via ORAL
  Filled 2022-04-05 (×2): qty 2

## 2022-04-05 MED ORDER — POTASSIUM CHLORIDE CRYS ER 20 MEQ PO TBCR
40.0000 meq | EXTENDED_RELEASE_TABLET | Freq: Two times a day (BID) | ORAL | Status: DC
  Administered 2022-04-05 – 2022-04-07 (×4): 40 meq via ORAL
  Filled 2022-04-05 (×4): qty 2

## 2022-04-05 MED ORDER — MUPIROCIN 2 % EX OINT
1.0000 | TOPICAL_OINTMENT | Freq: Two times a day (BID) | CUTANEOUS | Status: DC
  Administered 2022-04-05 – 2022-04-07 (×5): 1 via NASAL
  Filled 2022-04-05: qty 22

## 2022-04-05 MED ORDER — CHLORHEXIDINE GLUCONATE CLOTH 2 % EX PADS
6.0000 | MEDICATED_PAD | Freq: Every day | CUTANEOUS | Status: DC
  Administered 2022-04-05 – 2022-04-07 (×3): 6 via TOPICAL

## 2022-04-05 MED ORDER — ATENOLOL 25 MG PO TABS: 25.0000 mg | ORAL_TABLET | Freq: Every day | ORAL | Status: DC

## 2022-04-05 NOTE — Progress Notes (Addendum)
Progress Note    [name]  IRJ:[id number] DOB: [birth year]  DOA: 04/03/2022 PCP: Ranae Plumber, PA      Brief Narrative:    Medical records reviewed and are as summarized below:  [name] is a 66 y.o. female with history of morbid obesity, hypertension, bilateral lower extremity lymphedema, chronic right lower extremity cellulitis, who presents emergency department for chief concerns of failing outpatient therapy for cellulitis.  She said she had finished a 14-day course of doxycycline about 3 weeks prior to admission.  She was started on a 30-day course of doxycycline a week prior to admission.  However, she has not seen any improvement in her "cellulitis".  She was referred to the emergency department from the wound care clinic for further management of cellulitis having failed outpatient antibiotics.  She said that she noticed warmth and increasing redness with drainage particularly on the right leg.       Assessment/Plan:   Principal Problem:   Cellulitis of right leg Active Problems:   Bilateral leg pain   Chronic kidney disease, stage 3a (HCC)   Lymphedema   Hypokalemia   Hypomagnesemia   Hypotension   OSA (obstructive sleep apnea)   Chronic diastolic CHF (congestive heart failure) (HCC)   Tobacco use   HTN (hypertension)   Morbid obesity with BMI of 60.0-69.9, adult (HCC)   Body mass index is 69.09 kg/m.  (Morbid obesity)  Bilateral lower extremity lymphedema with right leg cellulitis: Continue IV vancomycin.  MRSA PCR screen was positive.  No evidence of DVT on venous duplex of the lower extremities.  Hypokalemia: Continue potassium repletion  Hypomagnesemia: Improved  Chronic diastolic CHF: Hold Bumex because of hypotension  Hypotension with history of hypertension: Hold atenolol and Bumex.  OSA: Use CPAP at night  Diet Order             Diet Heart Room service appropriate? Yes; Fluid consistency: Thin  Diet effective now                             Consultants: None  Procedures: None    Medications:    [START ON 04/06/2022] atenolol  25 mg Oral Daily   [START ON 04/06/2022] bumetanide  2 mg Oral Daily   [START ON 04/06/2022] Chlorhexidine Gluconate Cloth  6 each Topical Q0600   cyanocobalamin  500 mcg Oral Daily   enoxaparin (LOVENOX) injection  0.5 mg/kg Subcutaneous Q24H   mupirocin ointment  1 Application Nasal BID   potassium chloride SA  40 mEq Oral BID   pregabalin  100 mg Oral BID   senna-docusate  1 tablet Oral Daily   Continuous Infusions:  vancomycin 1,250 mg (04/04/22 2045)     Anti-infectives (From admission, onward)    Start     Dose/Rate Route Frequency Ordered Stop   04/04/22 1800  vancomycin (VANCOREADY) IVPB 1250 mg/250 mL        1,250 mg 50 mL/hr over 180 Minutes Intravenous Every 24 hours 04/03/22 1611     04/03/22 1400  ceFEPIme (MAXIPIME) 2 g in sodium chloride 0.9 % 100 mL IVPB  Status:  Discontinued        2 g 200 mL/hr over 30 Minutes Intravenous Every 8 hours 04/03/22 1330 04/04/22 1157   04/03/22 1345  vancomycin (VANCOREADY) IVPB 2000 mg/400 mL        2,000 mg 100 mL/hr over 240 Minutes Intravenous  Once 04/03/22  1330 04/04/22 0330              Family Communication/Anticipated D/C date and plan/Code Status   DVT prophylaxis: Place TED hose Start: 04/03/22 1259     Code Status: Full Code  Family Communication: None Disposition Plan: Plan to discharge home in 1 to 2 days   Status is: Inpatient Remains inpatient appropriate because: IV antibiotics for cellulitis of the right leg       Subjective:   She still has pain in the right leg.  She is concerned about her low blood pressure.  Objective:    Vitals:   04/04/22 1632 04/04/22 2337 04/05/22 0823 04/05/22 0831  BP: (!) 99/54 (!) 100/51 (!) 85/45 (!) 108/58  Pulse: 83 67 80   Resp: '16 20 17   '$ Temp: 98.6 F (37 C) 97.9 F (36.6 C) 97.9 F (36.6 C)   TempSrc:      SpO2:  100% 98% 99%   Weight:      Height:       No data found.   Intake/Output Summary (Last 24 hours) at 04/05/2022 1503 Last data filed at 04/05/2022 1300 Gross per 24 hour  Intake 1333.9 ml  Output 1200 ml  Net 133.9 ml   Filed Weights   04/03/22 1122  Weight: (!) 176.9 kg    Exam:  GEN: NAD SKIN: Warm and dry EYES: No pallor or icterus ENT: MMM CV: RRR PULM: CTA B ABD: soft, obese, NT, +BS CNS: AAO x 3, non focal EXT: Bilateral lower extremity lymphedema with erythematous changes on the distal legs.  Pustular lesions noted on the posterolateral aspect of right leg          Data Reviewed:   I have personally reviewed following labs and imaging studies:  Labs: Labs show the following:   Basic Metabolic Panel: Recent Labs  Lab 04/03/22 1129 04/04/22 0435 04/05/22 0451  NA 138 140  --   K 3.4* 3.2* 3.1*  CL 98 102  --   CO2 27 28  --   GLUCOSE 101* 97  --   BUN 41* 38*  --   CREATININE 1.48* 1.56* 1.29*  CALCIUM 8.6* 7.9*  --   MG 1.7 1.5* 1.8  PHOS 3.6  --   --    GFR Estimated Creatinine Clearance: 69.2 mL/min (A) (by C-G formula based on SCr of 1.29 mg/dL (H)). Liver Function Tests: Recent Labs  Lab 04/03/22 1129  AST 18  ALT 11  ALKPHOS 108  BILITOT 0.9  PROT 8.6*  ALBUMIN 3.4*   No results for input(s): "LIPASE", "AMYLASE" in the last 168 hours. No results for input(s): "AMMONIA" in the last 168 hours. Coagulation profile No results for input(s): "INR", "PROTIME" in the last 168 hours.  CBC: Recent Labs  Lab 04/03/22 1129 04/04/22 0435  WBC 7.0 5.6  NEUTROABS 4.5  --   HGB 11.1* 9.8*  HCT 36.0 32.2*  MCV 84.3 85.2  PLT 172 151   Cardiac Enzymes: No results for input(s): "CKTOTAL", "CKMB", "CKMBINDEX", "TROPONINI" in the last 168 hours. BNP (last 3 results) No results for input(s): "PROBNP" in the last 8760 hours. CBG: No results for input(s): "GLUCAP" in the last 168 hours. D-Dimer: No results for input(s): "DDIMER" in  the last 72 hours. Hgb A1c: No results for input(s): "HGBA1C" in the last 72 hours. Lipid Profile: No results for input(s): "CHOL", "HDL", "LDLCALC", "TRIG", "CHOLHDL", "LDLDIRECT" in the last 72 hours. Thyroid function studies: No results for  input(s): "TSH", "T4TOTAL", "T3FREE", "THYROIDAB" in the last 72 hours.  Invalid input(s): "FREET3" Anemia work up: No results for input(s): "VITAMINB12", "FOLATE", "FERRITIN", "TIBC", "IRON", "RETICCTPCT" in the last 72 hours. Sepsis Labs: Recent Labs  Lab 04/03/22 1129 04/03/22 1131 04/04/22 0435  WBC 7.0  --  5.6  LATICACIDVEN 1.1 1.5  --     Microbiology Recent Results (from the past 240 hour(s))  Surgical pcr screen     Status: Abnormal   Collection Time: 04/05/22 11:00 AM   Specimen: Nasal Mucosa; Nasal Swab  Result Value Ref Range Status   MRSA, PCR POSITIVE (A) NEGATIVE Final    Comment: RESULT CALLED TO, READ BACK BY AND VERIFIED WITH: YEMI AJIBAD 1229 04/05/22 MU    Staphylococcus aureus POSITIVE (A) NEGATIVE Final    Comment: (NOTE) The Xpert SA Assay (FDA approved for NASAL specimens in patients 77 years of age and older), is one component of a comprehensive surveillance program. It is not intended to diagnose infection nor to guide or monitor treatment. Performed at [facility], [address] [place], [place] [address]     Procedures and diagnostic studies:  US Venous Img Lower Bilateral (DVT)  Result Date: 04/03/2022 CLINICAL DATA:  Leg swelling. EXAM: BILATERAL LOWER EXTREMITY VENOUS DOPPLER ULTRASOUND TECHNIQUE: Gray-scale sonography with graded compression, as well as color Doppler and duplex ultrasound were performed to evaluate the lower extremity deep venous systems from the level of the common femoral vein and including the common femoral, femoral, profunda femoral, popliteal and calf veins including the posterior tibial, peroneal and gastrocnemius veins when visible. The superficial great saphenous  vein was also interrogated. Spectral Doppler was utilized to evaluate flow at rest and with distal augmentation maneuvers in the common femoral, femoral and popliteal veins. COMPARISON:  Lower extremity venous Doppler ultrasound 10/03/2021. FINDINGS: RIGHT LOWER EXTREMITY Common Femoral Vein: No evidence of thrombus. Normal compressibility, respiratory phasicity and response to augmentation. Saphenofemoral Junction: No evidence of thrombus. Normal compressibility and flow on color Doppler imaging. Profunda Femoral Vein: No evidence of thrombus. Normal compressibility and flow on color Doppler imaging. Femoral Vein: No evidence of thrombus. Normal compressibility, respiratory phasicity and response to augmentation. Popliteal Vein: No evidence of thrombus. Normal compressibility, respiratory phasicity and response to augmentation. Calf Veins: No evidence of thrombus. Normal compressibility and flow on color Doppler imaging. Superficial Great Saphenous Vein: No evidence of thrombus. Normal compressibility. Venous Reflux:  None. Other Findings: Examination is limited/suboptimal secondary to body habitus and severe soft tissue swelling. LEFT LOWER EXTREMITY Common Femoral Vein: No evidence of thrombus. Normal compressibility, respiratory phasicity and response to augmentation. Saphenofemoral Junction: No evidence of thrombus. Normal compressibility and flow on color Doppler imaging. Profunda Femoral Vein: No evidence of thrombus. Normal compressibility and flow on color Doppler imaging. Femoral Vein: No evidence of thrombus. Normal compressibility, respiratory phasicity and response to augmentation. Popliteal Vein: No evidence of thrombus. Normal compressibility, respiratory phasicity and response to augmentation. Calf Veins: No evidence of thrombus. Normal compressibility and flow on color Doppler imaging. Superficial Great Saphenous Vein: No evidence of thrombus. Normal compressibility. Venous Reflux:  None. Other  Findings: Examination is limited/suboptimal secondary to body habitus and severe soft tissue swelling. IMPRESSION: Limited examination. No evidence of deep venous thrombosis in either lower extremity. Electronically Signed   By: Ronney Asters M.D.   On: 04/03/2022 15:51               LOS: 1 day   [place]  [facility]  Pager on [email or web address]. If 7PM-7AM, please contact night-coverage at [email or web address]     04/05/2022, 3:03 PM

## 2022-04-06 ENCOUNTER — Ambulatory Visit: Payer: Medicare HMO | Admitting: Family

## 2022-04-06 DIAGNOSIS — L0390 Cellulitis, unspecified: Secondary | ICD-10-CM | POA: Diagnosis not present

## 2022-04-06 DIAGNOSIS — I5032 Chronic diastolic (congestive) heart failure: Secondary | ICD-10-CM | POA: Diagnosis not present

## 2022-04-06 DIAGNOSIS — L03115 Cellulitis of right lower limb: Secondary | ICD-10-CM | POA: Diagnosis not present

## 2022-04-06 LAB — BASIC METABOLIC PANEL
Anion gap: 5 (ref 5–15)
BUN: 28 mg/dL — ABNORMAL HIGH (ref 8–23)
CO2: 30 mmol/L (ref 22–32)
Calcium: 7.9 mg/dL — ABNORMAL LOW (ref 8.9–10.3)
Chloride: 104 mmol/L (ref 98–111)
Creatinine, Ser: 1.17 mg/dL — ABNORMAL HIGH (ref 0.44–1.00)
GFR, Estimated: 51 mL/min — ABNORMAL LOW (ref >60)
Glucose, Bld: 94 mg/dL (ref 70–99)
Potassium: 3.7 mmol/L (ref 3.5–5.1)
Sodium: 139 mmol/L (ref 135–145)

## 2022-04-06 LAB — MAGNESIUM: Magnesium: 1.9 mg/dL (ref 1.7–2.4)

## 2022-04-06 MED ORDER — CEFAZOLIN SODIUM-DEXTROSE 1-4 GM/50ML-% IV SOLN
1.0000 g | Freq: Three times a day (TID) | INTRAVENOUS | Status: DC
  Administered 2022-04-06 – 2022-04-07 (×3): 1 g via INTRAVENOUS
  Filled 2022-04-06 (×4): qty 50

## 2022-04-06 NOTE — Care Management Important Message (Signed)
Important Message  Patient Details  Name: [name] MRN: [id number] Date of Birth: 1955-08-12   Medicare Important Message Given:  N/A - LOS <3 / Initial given by admissions     Dannette Barbara 04/06/2022, 8:55 AM

## 2022-04-06 NOTE — Plan of Care (Signed)
  Problem: Education: Goal: Knowledge of General Education information will improve Description: Including pain rating scale, medication(s)/side effects and non-pharmacologic comfort measures Outcome: Progressing   Problem: Clinical Measurements: Goal: Will remain free from infection Outcome: Progressing   Problem: Activity: Goal: Risk for activity intolerance will decrease Outcome: Progressing   

## 2022-04-06 NOTE — Plan of Care (Signed)

## 2022-04-06 NOTE — Progress Notes (Signed)
1022 Skin tear to back of right leg in between folds. MD aware. Xeroform and ABD pad applied.

## 2022-04-06 NOTE — Progress Notes (Signed)
Progress Note    [name]  JAS:[id number] DOB: 21-Jul-1955  DOA: 04/03/2022 PCP: Ranae Plumber, PA      Brief Narrative:    Medical records reviewed and are as summarized below:  [name] is a 66 y.o. female with history of morbid obesity, hypertension, bilateral lower extremity lymphedema, chronic right lower extremity cellulitis, who presents emergency department for chief concerns of failing outpatient therapy for cellulitis.  She said she had finished a 14-day course of doxycycline about 3 weeks prior to admission.  She was started on a 30-day course of doxycycline a week prior to admission.  However, she has not seen any improvement in her "cellulitis".  She was referred to the emergency department from the wound care clinic for further management of cellulitis having failed outpatient antibiotics.  She said that she noticed warmth and increasing redness with drainage particularly on the right leg.       Assessment/Plan:   Principal Problem:   Cellulitis of right leg Active Problems:   Bilateral leg pain   Chronic kidney disease, stage 3a (HCC)   Lymphedema   Hypokalemia   Hypomagnesemia   Hypotension   OSA (obstructive sleep apnea)   Chronic diastolic CHF (congestive heart failure) (HCC)   Tobacco use   HTN (hypertension)   Morbid obesity with BMI of 60.0-69.9, adult (HCC)   Body mass index is 69.09 kg/m.  (Morbid obesity)  Bilateral lower extremity lymphedema with right leg cellulitis: Change IV vancomycin to IV cefazolin.  Discussed with Chrys Racer, pharmacist.  No evidence of DVT on venous duplex of the lower extremities.  Skin tear on the right posterior knee: Apply Xeroform and ABD pad  Hypokalemia: Continue potassium repletion  Hypomagnesemia: Improved  Chronic diastolic CHF: Restart Bumex  Hypotension with history of hypertension: Hold atenolol  OSA: Use CPAP at night  Diet Order             Diet Heart Room service  appropriate? Yes; Fluid consistency: Thin  Diet effective now                            Consultants: None  Procedures: None    Medications:    bumetanide  2 mg Oral Daily   Chlorhexidine Gluconate Cloth  6 each Topical Q0600   cyanocobalamin  500 mcg Oral Daily   enoxaparin (LOVENOX) injection  0.5 mg/kg Subcutaneous Q24H   mupirocin ointment  1 Application Nasal BID   potassium chloride SA  40 mEq Oral BID   pregabalin  100 mg Oral BID   senna-docusate  1 tablet Oral Daily   Continuous Infusions:   ceFAZolin (ANCEF) IV 1 g (04/06/22 1443)     Anti-infectives (From admission, onward)    Start     Dose/Rate Route Frequency Ordered Stop   04/06/22 1400  ceFAZolin (ANCEF) IVPB 1 g/50 mL premix        1 g 100 mL/hr over 30 Minutes Intravenous Every 8 hours 04/06/22 1135     04/04/22 1800  vancomycin (VANCOREADY) IVPB 1250 mg/250 mL  Status:  Discontinued        1,250 mg 83.3 mL/hr over 180 Minutes Intravenous Every 24 hours 04/03/22 1611 04/06/22 1134   04/03/22 1400  ceFEPIme (MAXIPIME) 2 g in sodium chloride 0.9 % 100 mL IVPB  Status:  Discontinued        2 g 200 mL/hr over 30 Minutes Intravenous Every 8  hours 04/03/22 1330 04/04/22 1157   04/03/22 1345  vancomycin (VANCOREADY) IVPB 2000 mg/400 mL        2,000 mg 100 mL/hr over 240 Minutes Intravenous  Once 04/03/22 1330 04/04/22 0330              Family Communication/Anticipated D/C date and plan/Code Status   DVT prophylaxis: Place TED hose Start: 04/03/22 1259     Code Status: Full Code  Family Communication: None Disposition Plan: Plan to discharge home  tomorrow   Status is: Inpatient Remains inpatient appropriate because: IV antibiotics for cellulitis of the right leg       Subjective:   Interval events noted.  She complains of bleeding from the posterior aspect of the right knee.  She said she normally puts on ABD pad under the area because of intermittent oozing in that  area from lymphedema.  A few days ago, she noticed blood on the ABD pad.  She has not seen any bleeding today but she wanted me to check it. Landry Mellow, RN was at the bedside  Objective:    Vitals:   04/05/22 1714 04/06/22 0000 04/06/22 0800 04/06/22 1522  BP: (!) 98/54 (!) 108/50 103/68 (!) 101/51  Pulse: 85 100 84 82  Resp: 17 20    Temp: 98.2 F (36.8 C) 98.5 F (36.9 C) 98.2 F (36.8 C) 98 F (36.7 C)  TempSrc:      SpO2: 100% 95% 96% 96%  Weight:      Height:       No data found.   Intake/Output Summary (Last 24 hours) at 04/06/2022 1554 Last data filed at 04/06/2022 0804 Gross per 24 hour  Intake 1240 ml  Output 1400 ml  Net -160 ml   Filed Weights   04/03/22 1122  Weight: (!) 176.9 kg    Exam:  GEN: NAD SKIN: Warm and dry EYES: EOMI ENT: MMM CV: RRR PULM: CTA B ABD: soft, obese, NT, +BS CNS: AAO x 3, non focal EXT: B/l lower extremity lymphedema with erythema on b/l legs. Skin tear on the posterior aspect of the right knee without active bleeding.                  Data Reviewed:   I have personally reviewed following labs and imaging studies:  Labs: Labs show the following:   Basic Metabolic Panel: Recent Labs  Lab 04/03/22 1129 04/04/22 0435 04/05/22 0451 04/06/22 0547  NA 138 140  --  139  K 3.4* 3.2* 3.1* 3.7  CL 98 102  --  104  CO2 27 28  --  30  GLUCOSE 101* 97  --  94  BUN 41* 38*  --  28*  CREATININE 1.48* 1.56* 1.29* 1.17*  CALCIUM 8.6* 7.9*  --  7.9*  MG 1.7 1.5* 1.8 1.9  PHOS 3.6  --   --   --    GFR Estimated Creatinine Clearance: 76.3 mL/min (A) (by C-G formula based on SCr of 1.17 mg/dL (H)). Liver Function Tests: Recent Labs  Lab 04/03/22 1129  AST 18  ALT 11  ALKPHOS 108  BILITOT 0.9  PROT 8.6*  ALBUMIN 3.4*   No results for input(s): "LIPASE", "AMYLASE" in the last 168 hours. No results for input(s): "AMMONIA" in the last 168 hours. Coagulation profile No results for input(s): "INR", "PROTIME" in  the last 168 hours.  CBC: Recent Labs  Lab 04/03/22 1129 04/04/22 0435  WBC 7.0 5.6  NEUTROABS 4.5  --  HGB 11.1* 9.8*  HCT 36.0 32.2*  MCV 84.3 85.2  PLT 172 151   Cardiac Enzymes: No results for input(s): "CKTOTAL", "CKMB", "CKMBINDEX", "TROPONINI" in the last 168 hours. BNP (last 3 results) No results for input(s): "PROBNP" in the last 8760 hours. CBG: No results for input(s): "GLUCAP" in the last 168 hours. D-Dimer: No results for input(s): "DDIMER" in the last 72 hours. Hgb A1c: No results for input(s): "HGBA1C" in the last 72 hours. Lipid Profile: No results for input(s): "CHOL", "HDL", "LDLCALC", "TRIG", "CHOLHDL", "LDLDIRECT" in the last 72 hours. Thyroid function studies: No results for input(s): "TSH", "T4TOTAL", "T3FREE", "THYROIDAB" in the last 72 hours.  Invalid input(s): "FREET3" Anemia work up: No results for input(s): "VITAMINB12", "FOLATE", "FERRITIN", "TIBC", "IRON", "RETICCTPCT" in the last 72 hours. Sepsis Labs: Recent Labs  Lab 04/03/22 1129 04/03/22 1131 04/04/22 0435  WBC 7.0  --  5.6  LATICACIDVEN 1.1 1.5  --     Microbiology Recent Results (from the past 240 hour(s))  Surgical pcr screen     Status: Abnormal   Collection Time: 04/05/22 11:00 AM   Specimen: Nasal Mucosa; Nasal Swab  Result Value Ref Range Status   MRSA, PCR POSITIVE (A) NEGATIVE Final    Comment: RESULT CALLED TO, READ BACK BY AND VERIFIED WITH: YEMI AJIBAD 1229 04/05/22 MU    Staphylococcus aureus POSITIVE (A) NEGATIVE Final    Comment: (NOTE) The Xpert SA Assay (FDA approved for NASAL specimens in patients 23 years of age and older), is one component of a comprehensive surveillance program. It is not intended to diagnose infection nor to guide or monitor treatment. Performed at [facility], [address] [place], [place] [address]     Procedures and diagnostic studies:  No results found.             LOS: 2 days   Lekesha Claw  [facility]   Pager on [email or web address]. If 7PM-7AM, please contact night-coverage at [email or web address]     04/06/2022, 3:54 PM

## 2022-04-06 NOTE — Progress Notes (Signed)
VONYA, OHALLORAN ([id number]) Visit Report for 04/03/2022 Arrival Information Details Patient Name: [name], [name]. Date of Service: 04/03/2022 9:45 AM Medical Record Number: [id number] Patient Account Number: [id number] Date of Birth/Sex: 04/05/1956 (66 y.o. F) Treating RN: Cornell Barman Primary Care Jordell Outten: Ranae Plumber Other Clinician: Massie Kluver Referring Sharmayne Jablon: Ranae Plumber Treating Merril Nagy/Extender: Skipper Cliche in Treatment: 26 Visit Information History Since Last Visit All ordered tests and consults were completed: No Patient Arrived: Ambulatory Added or deleted any medications: No Arrival Time: 10:14 Any new allergies or adverse reactions: No Transfer Assistance: None Had a fall or experienced change in No Patient Has Alerts: Yes activities of daily living that may affect Patient Alerts: Borderline Diabetic risk of falls: Hospitalized since last visit: No Pain Present Now: Yes Electronic Signature(s) Signed: 04/06/2022 12:10:36 PM By: Massie Kluver Entered By: Massie Kluver on 04/03/2022 10:20:22 [place], [name] ([id number]) -------------------------------------------------------------------------------- Clinic Level of Care Assessment Details Patient Name: [name]. Date of Service: 04/03/2022 9:45 AM Medical Record Number: [id number] Patient Account Number: [id number] Date of Birth/Sex: 10/22/1955 (66 y.o. F) Treating RN: Cornell Barman Primary Care Zariya Minner: Ranae Plumber Other Clinician: Massie Kluver Referring Jakaylah Schlafer: Ranae Plumber Treating Charlestine Rookstool/Extender: Skipper Cliche in Treatment: 35 Clinic Level of Care Assessment Items TOOL 4 Quantity Score '[]'$  - Use when only an EandM is performed on FOLLOW-UP visit 0 ASSESSMENTS - Nursing Assessment / Reassessment X - Reassessment of Co-morbidities (includes updates in patient status) 1 10 X- 1 5 Reassessment of Adherence to Treatment Plan ASSESSMENTS - Wound and Skin Assessment /  Reassessment X - Simple Wound Assessment / Reassessment - one wound 1 5 '[]'$  - 0 Complex Wound Assessment / Reassessment - multiple wounds '[]'$  - 0 Dermatologic / Skin Assessment (not related to wound area) ASSESSMENTS - Focused Assessment '[]'$  - Circumferential Edema Measurements - multi extremities 0 '[]'$  - 0 Nutritional Assessment / Counseling / Intervention '[]'$  - 0 Lower Extremity Assessment (monofilament, tuning fork, pulses) '[]'$  - 0 Peripheral Arterial Disease Assessment (using hand held doppler) ASSESSMENTS - Ostomy and/or Continence Assessment and Care '[]'$  - Incontinence Assessment and Management 0 '[]'$  - 0 Ostomy Care Assessment and Management (repouching, etc.) PROCESS - Coordination of Care X - Simple Patient / Family Education for ongoing care 1 15 '[]'$  - 0 Complex (extensive) Patient / Family Education for ongoing care '[]'$  - 0 Staff obtains Programmer, systems, Records, Test Results / Process Orders '[]'$  - 0 Staff telephones HHA, Nursing Homes / Clarify orders / etc '[]'$  - 0 Routine Transfer to another Facility (non-emergent condition) '[]'$  - 0 Routine Hospital Admission (non-emergent condition) '[]'$  - 0 New Admissions / Biomedical engineer / Ordering NPWT, Apligraf, etc. '[]'$  - 0 Emergency Hospital Admission (emergent condition) X- 1 10 Simple Discharge Coordination '[]'$  - 0 Complex (extensive) Discharge Coordination PROCESS - Special Needs '[]'$  - Pediatric / Minor Patient Management 0 '[]'$  - 0 Isolation Patient Management '[]'$  - 0 Hearing / Language / Visual special needs '[]'$  - 0 Assessment of Community assistance (transportation, D/C planning, etc.) '[]'$  - 0 Additional assistance / Altered mentation '[]'$  - 0 Support Surface(s) Assessment (bed, cushion, seat, etc.) INTERVENTIONS - Wound Cleansing / Measurement Kielty, Milagro C. ([id number]) X- 1 5 Simple Wound Cleansing - one wound '[]'$  - 0 Complex Wound Cleansing - multiple wounds X- 1 5 Wound Imaging (photographs - any number of wounds) '[]'$   - 0 Wound Tracing (instead of photographs) X- 1 5 Simple Wound Measurement - one wound '[]'$  - 0  Complex Wound Measurement - multiple wounds INTERVENTIONS - Wound Dressings '[]'$  - Small Wound Dressing one or multiple wounds 0 X- 1 15 Medium Wound Dressing one or multiple wounds '[]'$  - 0 Large Wound Dressing one or multiple wounds '[]'$  - 0 Application of Medications - topical '[]'$  - 0 Application of Medications - injection INTERVENTIONS - Miscellaneous '[]'$  - External ear exam 0 '[]'$  - 0 Specimen Collection (cultures, biopsies, blood, body fluids, etc.) '[]'$  - 0 Specimen(s) / Culture(s) sent or taken to Lab for analysis '[]'$  - 0 Patient Transfer (multiple staff / Civil Service fast streamer / Similar devices) '[]'$  - 0 Simple Staple / Suture removal (25 or less) '[]'$  - 0 Complex Staple / Suture removal (26 or more) '[]'$  - 0 Hypo / Hyperglycemic Management (close monitor of Blood Glucose) '[]'$  - 0 Ankle / Brachial Index (ABI) - do not check if billed separately X- 1 5 Vital Signs Has the patient been seen at the hospital within the last three years: Yes Total Score: 80 Level Of Care: New/Established - Level 3 Electronic Signature(s) Signed: 04/06/2022 12:10:36 PM By: Massie Kluver Entered By: Massie Kluver on 04/03/2022 10:45:31 [place], [name] ([id number]) -------------------------------------------------------------------------------- Encounter Discharge Information Details Patient Name: [name]. Date of Service: 04/03/2022 9:45 AM Medical Record Number: [id number] Patient Account Number: [id number] Date of Birth/Sex: 04-26-1956 (66 y.o. F) Treating RN: Cornell Barman Primary Care Daaron Dimarco: Ranae Plumber Other Clinician: Massie Kluver Referring Gaylan Fauver: Ranae Plumber Treating Alesha Jaffee/Extender: Skipper Cliche in Treatment: 35 Encounter Discharge Information Items Discharge Condition: Stable Ambulatory Status: Ambulatory Discharge Destination: Hospital Telephoned: Yes Orders Sent:  Yes Transportation: Other Accompanied By: self Schedule Follow-up Appointment: Yes Clinical Summary of Care: Electronic Signature(s) Signed: 04/06/2022 12:10:36 PM By: Massie Kluver Entered By: Massie Kluver on 04/03/2022 16:20:30 [place], [name] ([id number]) -------------------------------------------------------------------------------- Lower Extremity Assessment Details Patient Name: [name] Date of Service: 04/03/2022 9:45 AM Medical Record Number: [id number] Patient Account Number: [id number] Date of Birth/Sex: Aug 20, 1955 (66 y.o. F) Treating RN: Cornell Barman Primary Care Neftaly Inzunza: Ranae Plumber Other Clinician: Massie Kluver Referring Rebekah Sprinkle: Ranae Plumber Treating Jaylenn Altier/Extender: Skipper Cliche in Treatment: 35 Electronic Signature(s) Signed: 04/03/2022 10:42:44 AM By: Gretta Cool BSN, RN, CWS, Kim RN, BSN Signed: 04/06/2022 12:10:36 PM By: Massie Kluver Entered By: Massie Kluver on 04/03/2022 10:36:29 [place], [name] ([id number]) -------------------------------------------------------------------------------- Multi Wound Chart Details Patient Name: KENNESHIA, REHM C. Date of Service: 04/03/2022 9:45 AM Medical Record Number: [id number] Patient Account Number: [id number] Date of Birth/Sex: [birth year] (66 y.o. F) Treating RN: Cornell Barman Primary Care Lauri Purdum: Ranae Plumber Other Clinician: Massie Kluver Referring Tamina Cyphers: Ranae Plumber Treating Evelette Hollern/Extender: Skipper Cliche in Treatment: 35 Vital Signs Height(in): 22 Pulse(bpm): 70 Weight(lbs): 372 Blood Pressure(mmHg): 136/87 Body Mass Index(BMI): 65.9 Temperature(F): 98.1 Respiratory Rate(breaths/min): 18 Photos: [N/A:N/A] Wound Location: Left, Medial, Anterior Lower Leg N/A N/A Wounding Event: Gradually Appeared N/A N/A Primary Etiology: Lymphedema N/A N/A Comorbid History: Cataracts, Chronic sinus N/A N/A problems/congestion, Anemia, Lymphedema, Asthma, Sleep Apnea,  Tuberculosis, Angina, Hypertension, Gout, Osteoarthritis Date Acquired: 10/17/2021 N/A N/A Weeks of Treatment: 24 N/A N/A Wound Status: Open N/A N/A Wound Recurrence: No N/A N/A Measurements L x W x D (cm) 1x2.5x0.1 N/A N/A Area (cm) : 1.963 N/A N/A Volume (cm) : 0.196 N/A N/A % Reduction in Area: 75.00% N/A N/A % Reduction in Volume: 75.00% N/A N/A Classification: Full Thickness Without Exposed N/A N/A Support Structures Exudate Amount: Medium N/A N/A Exudate Type: Serosanguineous N/A N/A Exudate Color: red, brown N/A  N/A Wound Margin: Indistinct, nonvisible N/A N/A Granulation Amount: Medium (34-66%) N/A N/A Granulation Quality: Red N/A N/A Necrotic Amount: Small (1-33%) N/A N/A Exposed Structures: Fat Layer (Subcutaneous Tissue): N/A N/A Yes Epithelialization: Large (67-100%) N/A N/A Treatment Notes Electronic Signature(s) Signed: 04/06/2022 12:10:36 PM By: Massie Kluver Entered By: Massie Kluver on 04/03/2022 [place], [name] ([id number]) -------------------------------------------------------------------------------- [place] Details Patient Name: [name]. Date of Service: 04/03/2022 9:45 AM Medical Record Number: [id number] Patient Account Number: [id number] Date of Birth/Sex: [birth year] (66 y.o. F) Treating RN: Cornell Barman Primary Care Kaelene Elliston: Ranae Plumber Other Clinician: Massie Kluver Referring Pecola Haxton: Ranae Plumber Treating Lambros Cerro/Extender: Skipper Cliche in Treatment: 35 Active Inactive Wound/Skin Impairment Nursing Diagnoses: Impaired tissue integrity Knowledge deficit related to ulceration/compromised skin integrity Goals: Ulcer/skin breakdown will have a volume reduction of 30% by week 4 Date Initiated: 08/01/2021 Target Resolution Date: 08/29/2021 Goal Status: Active Ulcer/skin breakdown will have a volume reduction of 50% by week 8 Date Initiated: 08/01/2021 Target Resolution Date: 09/26/2021 Goal  Status: Active Ulcer/skin breakdown will have a volume reduction of 80% by week 12 Date Initiated: 08/01/2021 Target Resolution Date: 10/24/2021 Goal Status: Active Ulcer/skin breakdown will heal within 14 weeks Date Initiated: 08/01/2021 Target Resolution Date: 11/07/2021 Goal Status: Active Interventions: Assess patient/caregiver ability to obtain necessary supplies Assess patient/caregiver ability to perform ulcer/skin care regimen upon admission and as needed Assess ulceration(s) every visit Provide education on ulcer and skin care Notes: Electronic Signature(s) Signed: 04/03/2022 10:42:44 AM By: Gretta Cool, BSN, RN, CWS, Kim RN, BSN Signed: 04/06/2022 12:10:36 PM By: Massie Kluver Entered By: Massie Kluver on 04/03/2022 10:40:11 [place], [name] ([id number]) -------------------------------------------------------------------------------- Pain Assessment Details Patient Name: [name] Date of Service: 04/03/2022 9:45 AM Medical Record Number: [id number] Patient Account Number: [id number] Date of Birth/Sex: September 15, 1955 (66 y.o. F) Treating RN: Cornell Barman Primary Care Hamlet Lasecki: Ranae Plumber Other Clinician: Massie Kluver Referring Jru Pense: Ranae Plumber Treating Nichola Cieslinski/Extender: Skipper Cliche in Treatment: 35 Active Problems Location of Pain Severity and Description of Pain Patient Has Paino Yes Site Locations Pain Location: Generalized Pain, Pain in Ulcers Duration of the Pain. Constant / Intermittento Constant Rate the pain. Current Pain Level: 10 Character of Pain Describe the Pain: Burning, Stabbing Pain Management and Medication Current Pain Management: Medication: Yes Rest: Yes Electronic Signature(s) Signed: 04/03/2022 10:42:44 AM By: Gretta Cool, BSN, RN, CWS, Kim RN, BSN Signed: 04/06/2022 12:10:36 PM By: Massie Kluver Entered By: Massie Kluver on 04/03/2022 10:25:08 [name]  ([id number]) -------------------------------------------------------------------------------- Patient/Caregiver Education Details Patient Name: [name]. Date of Service: 04/03/2022 9:45 AM Medical Record Number: [id number] Patient Account Number: [id number] Date of Birth/Gender: 10-05-1955 (66 y.o. F) Treating RN: Cornell Barman Primary Care Physician: Ranae Plumber Other Clinician: Massie Kluver Referring Physician: Ranae Plumber Treating Physician/Extender: Skipper Cliche in Treatment: 41 Education Assessment Education Provided To: Patient Education Topics Provided Wound/Skin Impairment: Handouts: Other: continue wound care as directed Methods: Explain/Verbal Responses: State content correctly Electronic Signature(s) Signed: 04/06/2022 12:10:36 PM By: Massie Kluver Entered By: Massie Kluver on 04/03/2022 16:19:46 [place], [name] ([id number]) -------------------------------------------------------------------------------- Wound Assessment Details Patient Name: [name] Date of Service: 04/03/2022 9:45 AM Medical Record Number: [id number] Patient Account Number: [id number] Date of Birth/Sex: 15-Jan-1956 (66 y.o. F) Treating RN: Cornell Barman Primary Care Emmer Lillibridge: Ranae Plumber Other Clinician: Massie Kluver Referring [place] Lingenfelter: Ranae Plumber Treating Lycia Sachdeva/Extender: Skipper Cliche in Treatment: 35 Wound Status Wound Number: 10 Primary Lymphedema Etiology: Wound Location: Left,  Medial, Anterior Lower Leg Wound Open Wounding Event: Gradually Appeared Status: Date Acquired: 10/17/2021 Comorbid Cataracts, Chronic sinus problems/congestion, Anemia, Weeks Of Treatment: 24 History: Lymphedema, Asthma, Sleep Apnea, Tuberculosis, Angina, Clustered Wound: No Hypertension, Gout, Osteoarthritis Photos Wound Measurements Length: (cm) 1 Width: (cm) 2.5 Depth: (cm) 0.1 Area: (cm) 1.963 Volume: (cm) 0.196 % Reduction in Area: 75% % Reduction in  Volume: 75% Epithelialization: Large (67-100%) Wound Description Classification: Full Thickness Without Exposed Support Structu Wound Margin: Indistinct, nonvisible Exudate Amount: Medium Exudate Type: Serosanguineous Exudate Color: red, brown res Foul Odor After Cleansing: No Slough/Fibrino Yes Wound Bed Granulation Amount: Medium (34-66%) Exposed Structure Granulation Quality: Red Fat Layer (Subcutaneous Tissue) Exposed: Yes Necrotic Amount: Small (1-33%) Treatment Notes Wound #10 (Lower Leg) Wound Laterality: Left, Medial, Anterior Cleanser Soap and Water Discharge Instruction: Gently cleanse wound with antibacterial soap, rinse and pat dry prior to dressing wounds Peri-Wound Care Topical Primary Dressing LYNDSY, GILBERTO ([id number]) SILVERCEL Antimicrobial Alginate Dressing, 1x12 (in/in) Secondary Dressing [place] Secured With ACE WRAP - 93M ACE Elastic Bandage With VELCRO Brand Closure, 4 (in) Compression Wrap Compression Stockings Add-Ons Electronic Signature(s) Signed: 04/03/2022 10:42:44 AM By: Gretta Cool, BSN, RN, CWS, Kim RN, BSN Signed: 04/06/2022 12:10:36 PM By: Massie Kluver Entered By: Massie Kluver on 04/03/2022 [place], [name] ([id number]) -------------------------------------------------------------------------------- Vitals Details Patient Name: [name]. Date of Service: 04/03/2022 9:45 AM Medical Record Number: [id number] Patient Account Number: [id number] Date of Birth/Sex: 1956/03/28 (66 y.o. F) Treating RN: Cornell Barman Primary Care Tishara Pizano: Ranae Plumber Other Clinician: Massie Kluver Referring Calaya Gildner: Ranae Plumber Treating Aneya Daddona/Extender: Skipper Cliche in Treatment: 35 Vital Signs Time Taken: 10:22 Temperature (F): 98.1 Height (in): 63 Pulse (bpm): 87 Weight (lbs): 372 Respiratory Rate (breaths/min): 18 Body Mass Index (BMI): 65.9 Blood Pressure (mmHg): 136/87 Reference Range: 80 - 120 mg / dl Electronic  Signature(s) Signed: 04/06/2022 12:10:36 PM By: Massie Kluver Entered By: Massie Kluver on 04/03/2022 10:25:05

## 2022-04-07 DIAGNOSIS — I5032 Chronic diastolic (congestive) heart failure: Secondary | ICD-10-CM | POA: Diagnosis not present

## 2022-04-07 DIAGNOSIS — M79604 Pain in right leg: Secondary | ICD-10-CM

## 2022-04-07 DIAGNOSIS — N1831 Chronic kidney disease, stage 3a: Secondary | ICD-10-CM

## 2022-04-07 DIAGNOSIS — Z6841 Body Mass Index (BMI) 40.0 and over, adult: Secondary | ICD-10-CM

## 2022-04-07 DIAGNOSIS — M79605 Pain in left leg: Secondary | ICD-10-CM

## 2022-04-07 DIAGNOSIS — L0390 Cellulitis, unspecified: Secondary | ICD-10-CM | POA: Diagnosis not present

## 2022-04-07 DIAGNOSIS — L03115 Cellulitis of right lower limb: Secondary | ICD-10-CM | POA: Diagnosis not present

## 2022-04-07 LAB — BASIC METABOLIC PANEL
Anion gap: 7 (ref 5–15)
BUN: 25 mg/dL — ABNORMAL HIGH (ref 8–23)
CO2: 29 mmol/L (ref 22–32)
Calcium: 8.1 mg/dL — ABNORMAL LOW (ref 8.9–10.3)
Chloride: 103 mmol/L (ref 98–111)
Creatinine, Ser: 1 mg/dL (ref 0.44–1.00)
GFR, Estimated: >60 mL/min (ref >60)
Glucose, Bld: 105 mg/dL — ABNORMAL HIGH (ref 70–99)
Potassium: 3.8 mmol/L (ref 3.5–5.1)
Sodium: 139 mmol/L (ref 135–145)

## 2022-04-07 LAB — MAGNESIUM: Magnesium: 1.8 mg/dL (ref 1.7–2.4)

## 2022-04-07 MED ORDER — MUPIROCIN 2 % EX OINT: 1.0000 | TOPICAL_OINTMENT | Freq: Two times a day (BID) | CUTANEOUS | Status: AC

## 2022-04-07 MED ORDER — CEPHALEXIN 500 MG PO CAPS: 500.0000 mg | ORAL_CAPSULE | Freq: Four times a day (QID) | ORAL | 0 refills | Status: DC

## 2022-04-07 MED ORDER — CEPHALEXIN 500 MG PO CAPS: 500.0000 mg | ORAL_CAPSULE | Freq: Three times a day (TID) | ORAL | 0 refills | Status: DC

## 2022-04-07 MED ORDER — CEPHALEXIN 500 MG PO CAPS: 500.0000 mg | ORAL_CAPSULE | Freq: Four times a day (QID) | ORAL | 0 refills | Status: AC

## 2022-04-07 NOTE — TOC Transition Note (Addendum)
Transition of Care [facility]) - CM/SW Discharge Note   Patient Details  Name: [name] MRN: [id number] Date of Birth: 1955-08-19  [facility] CM/SW Contact:  Izola Price, RN Phone Number: 04/07/2022, 11:56 AM   Clinical Narrative: 9/30: Pending discharge and will need EMS transport to home at discharge. Checking on HH needs post acute care. Has all needed DME per prior CM notes. EMS forms printed to unit RN. [name] at [name] checking if patient active or not. Asked provider re [name] at this discharge or not. [name] RN CM      [phone]: [name] returned call and patient is not currently active with this patient. [name] RN CM  4709: Spoke with patient and she is refusing SNF. She does wish to have [name] services PT/OT/RN and was agreeable to [name] which has accepted service. Updated provider and requested [place] orders be placed. EMS forms printed to unit. [name] RN CM   Final next level of care: Home/Self Care Barriers to Discharge: Barriers Resolved   Patient Goals and CMS Choice        Discharge Placement                Patient to be transferred to facility by: [name] to home address.      Discharge Plan and Services   Discharge Planning Services: CM Consult            DME Arranged: N/A (Has multiple DME at home per CM notes.) DME Agency: NA       HH Arranged: NA HH Agency: NA        Social Determinants of Health (SDOH) Interventions     Readmission Risk Interventions    01/20/2021    9:48 AM  Readmission Risk Prevention Plan  Transportation Screening Complete  PCP or Specialist Appt within 5-7 Days Complete  Home Care Screening Complete  Medication Review (RN CM) Referral to Pharmacy

## 2022-04-07 NOTE — Discharge Summary (Signed)
Physician Discharge Summary   Patient: [name] MRN: [id number] DOB: 19-Apr-1956  Admit date:     04/03/2022  Discharge date: 04/07/2022  Discharge Physician: Jennye Boroughs   PCP: Ranae Plumber, PA   Recommendations at discharge:   Follow-up with PCP in 1 week Follow-up at the wound care clinic as scheduled  Discharge Diagnoses: Principal Problem:   Cellulitis of right leg Active Problems:   Bilateral leg pain   Chronic kidney disease, stage 3a (HCC)   Lymphedema   Hypokalemia   Hypomagnesemia   Hypotension   OSA (obstructive sleep apnea)   Chronic diastolic CHF (congestive heart failure) (HCC)   Tobacco use   HTN (hypertension)   Morbid obesity with BMI of 60.0-69.9, adult (HCC)  Resolved Problems:   * No resolved hospital problems. [facility] Course:  [name] is a 66 y.o. female with history of morbid obesity, hypertension, bilateral lower extremity lymphedema, chronic right lower extremity cellulitis, who presents emergency department for chief concerns of failing outpatient therapy for cellulitis.  She said she had finished a 14-day course of doxycycline about 3 weeks prior to admission.  She was started on a 30-day course of doxycycline a week prior to admission.  However, she has not seen any improvement in her "cellulitis".  She was referred to the emergency department from the wound care clinic for further management of cellulitis having failed outpatient doxycycline.  She said that she noticed warmth and increasing redness with drainage particularly on the right leg.  She said she has had recurrent cellulitis in the past and she knew that she developed cellulitis once the symptoms developed.  She was admitted to the hospital for right leg cellulitis.  She was treated with empiric IV antibiotics.  She had hypokalemia and hypomagnesemia that were repleted.  Her blood pressure was on the low side.  She said she had not been taking atenolol  consistently at home.  Atenolol was therefore discontinued at discharge.  Her condition has improved and she is deemed stable for discharge to home.       Consultants: None Procedures performed: None  Disposition: Home health Diet recommendation:  Discharge Diet Orders (From admission, onward)     Start     Ordered   04/07/22 0000  Diet - low sodium heart healthy        04/07/22 1114           Cardiac diet DISCHARGE MEDICATION: Allergies as of 04/07/2022       Reactions   Ace Inhibitors Hives, Swelling   Ibuprofen Hives, Swelling   Vancomycin Itching, Nausea And Vomiting        Medication List     STOP taking these medications    atenolol 25 MG tablet Commonly known as: TENORMIN   doxycycline 100 MG capsule Commonly known as: VIBRAMYCIN   Furoscix 80 MG/10ML Ctkt Generic drug: Furosemide       TAKE these medications    acetaminophen 500 MG tablet Commonly known as: TYLENOL Take 500-1,000 mg by mouth every 6 (six) hours as needed for mild pain or fever.   bumetanide 2 MG tablet Commonly known as: Bumex Take 1 tablet (2 mg total) by mouth daily.   cephALEXin 500 MG capsule Commonly known as: KEFLEX Take 1 capsule (500 mg total) by mouth 3 (three) times daily for 5 days.   cetirizine 10 MG tablet Commonly known as: ZYRTEC Take 1 tablet (10 mg total) by mouth daily.  cyanocobalamin 500 MCG tablet Commonly known as: VITAMIN B12 Take 500 mcg by mouth daily.   metolazone 5 MG tablet Commonly known as: ZAROXOLYN Take 1 tablet by mouth daily. What changed: Another medication with the same name was removed. Continue taking this medication, and follow the directions you see here.   mupirocin ointment 2 % Commonly known as: BACTROBAN Place 1 Application into the nose 2 (two) times daily for 2 days.   nystatin powder Generic drug: nystatin Apply topically.   potassium chloride SA 20 MEQ tablet Commonly known as: KLOR-CON M Take 2 tablets (40  mEq total) by mouth daily.   pregabalin 100 MG capsule Commonly known as: LYRICA Take 100 mg by mouth 2 (two) times daily.   senna-docusate 8.6-50 MG tablet Commonly known as: Senokot-S Take 1 tablet by mouth 2 (two) times daily.        Discharge Exam: Filed Weights   04/03/22 1122  Weight: (!) 176.9 kg   GEN: NAD SKIN: Warm and dry EYES: No pallor or icterus ENT: MMM CV: RRR PULM: CTA B ABD: soft, obese, NT, +BS CNS: AAO x 3, non focal EXT: Bilateral lower extremity lymphedema with chronic erythematous changes on the legs   Condition at discharge: good  The results of significant diagnostics from this hospitalization (including imaging, microbiology, ancillary and laboratory) are listed below for reference.   Imaging Studies: US Venous Img Lower Bilateral (DVT)  Result Date: 04/03/2022 CLINICAL DATA:  Leg swelling. EXAM: BILATERAL LOWER EXTREMITY VENOUS DOPPLER ULTRASOUND TECHNIQUE: Gray-scale sonography with graded compression, as well as color Doppler and duplex ultrasound were performed to evaluate the lower extremity deep venous systems from the level of the common femoral vein and including the common femoral, femoral, profunda femoral, popliteal and calf veins including the posterior tibial, peroneal and gastrocnemius veins when visible. The superficial great saphenous vein was also interrogated. Spectral Doppler was utilized to evaluate flow at rest and with distal augmentation maneuvers in the common femoral, femoral and popliteal veins. COMPARISON:  Lower extremity venous Doppler ultrasound 10/03/2021. FINDINGS: RIGHT LOWER EXTREMITY Common Femoral Vein: No evidence of thrombus. Normal compressibility, respiratory phasicity and response to augmentation. Saphenofemoral Junction: No evidence of thrombus. Normal compressibility and flow on color Doppler imaging. Profunda Femoral Vein: No evidence of thrombus. Normal compressibility and flow on color Doppler imaging.  Femoral Vein: No evidence of thrombus. Normal compressibility, respiratory phasicity and response to augmentation. Popliteal Vein: No evidence of thrombus. Normal compressibility, respiratory phasicity and response to augmentation. Calf Veins: No evidence of thrombus. Normal compressibility and flow on color Doppler imaging. Superficial Great Saphenous Vein: No evidence of thrombus. Normal compressibility. Venous Reflux:  None. Other Findings: Examination is limited/suboptimal secondary to body habitus and severe soft tissue swelling. LEFT LOWER EXTREMITY Common Femoral Vein: No evidence of thrombus. Normal compressibility, respiratory phasicity and response to augmentation. Saphenofemoral Junction: No evidence of thrombus. Normal compressibility and flow on color Doppler imaging. Profunda Femoral Vein: No evidence of thrombus. Normal compressibility and flow on color Doppler imaging. Femoral Vein: No evidence of thrombus. Normal compressibility, respiratory phasicity and response to augmentation. Popliteal Vein: No evidence of thrombus. Normal compressibility, respiratory phasicity and response to augmentation. Calf Veins: No evidence of thrombus. Normal compressibility and flow on color Doppler imaging. Superficial Great Saphenous Vein: No evidence of thrombus. Normal compressibility. Venous Reflux:  None. Other Findings: Examination is limited/suboptimal secondary to body habitus and severe soft tissue swelling. IMPRESSION: Limited examination. No evidence of deep venous thrombosis in either lower  extremity. Electronically Signed   By: Ronney Asters M.D.   On: 04/03/2022 15:51    Microbiology: Results for orders placed or performed during the hospital encounter of 04/03/22  Surgical pcr screen     Status: Abnormal   Collection Time: 04/05/22 11:00 AM   Specimen: Nasal Mucosa; Nasal Swab  Result Value Ref Range Status   MRSA, PCR POSITIVE (A) NEGATIVE Final    Comment: RESULT CALLED TO, READ BACK BY AND  VERIFIED WITH: YEMI AJIBAD 1229 04/05/22 MU    Staphylococcus aureus POSITIVE (A) NEGATIVE Final    Comment: (NOTE) The Xpert SA Assay (FDA approved for NASAL specimens in patients 15 years of age and older), is one component of a comprehensive surveillance program. It is not intended to diagnose infection nor to guide or monitor treatment. Performed at [facility], [address] [place], [place] [address]     Labs: CBC: Recent Labs  Lab 04/03/22 1129 04/04/22 0435  WBC 7.0 5.6  NEUTROABS 4.5  --   HGB 11.1* 9.8*  HCT 36.0 32.2*  MCV 84.3 85.2  PLT 172 528   Basic Metabolic Panel: Recent Labs  Lab 04/03/22 1129 04/04/22 0435 04/05/22 0451 04/06/22 0547 04/07/22 0540  NA 138 140  --  139 139  K 3.4* 3.2* 3.1* 3.7 3.8  CL 98 102  --  104 103  CO2 27 28  --  30 29  GLUCOSE 101* 97  --  94 105*  BUN 41* 38*  --  28* 25*  CREATININE 1.48* 1.56* 1.29* 1.17* 1.00  CALCIUM 8.6* 7.9*  --  7.9* 8.1*  MG 1.7 1.5* 1.8 1.9 1.8  PHOS 3.6  --   --   --   --    Liver Function Tests: Recent Labs  Lab 04/03/22 1129  AST 18  ALT 11  ALKPHOS 108  BILITOT 0.9  PROT 8.6*  ALBUMIN 3.4*   CBG: No results for input(s): "GLUCAP" in the last 168 hours.  Discharge time spent: greater than 30 minutes.  Signed: Jennye Boroughs, MD [facility] 04/07/2022

## 2022-04-07 NOTE — Evaluation (Signed)
Physical Therapy Evaluation Patient Details Name: [name] MRN: [id number] DOB: 08/02/1955 Today's Date: 04/07/2022  History of Present Illness  Pt is a 66 y.o. female presenting to hospital 9/26--pt sent to ED from wound center d/t failure of OP therapy for cellulitis (pt with chronic severe lymphedema B LE's and recurrent wounds and cellulitis).  Pt admitted with cellulitis R LE, lymphedema, B leg pain; noted with skin tear R posterior knee.  PMH includes morbid obesity, htn, B LE lymphedema, chronic R LE cellulitis, CHF, sleep apnea, and spinal stenosis.  Clinical Impression  Prior to hospital admission, pt was ambulatory with quad cane (occasionally no AD use); lives with her son in 1 level home with 1/2 step to enter with R railing.  Pt reporting receiving pain medication recently (for L LE, low back, and R hip pain).  Currently pt is min assist semi-supine to sitting edge of bed (increased effort/time and use of momentum to perform); CGA to stand from mildly elevated bed height up to bariatric RW; and CGA to ambulate a couple feet with bariatric RW (pt then reporting unable to walk anymore with walker and used heavy B UE support on bed to walk from bottom of bed to head of bed and then turn to sit in recliner)--pt noted with difficulty advancing R LE compared to L LE.  Pt would benefit from skilled PT to address noted impairments and functional limitations (see below for any additional details).  Upon hospital discharge, pt would benefit from SNF (pt would like to discharge home instead if possible).    Recommendations for follow up therapy are one component of a multi-disciplinary discharge planning process, led by the attending physician.  Recommendations may be updated based on patient status, additional functional criteria and insurance authorization.  Follow Up Recommendations Skilled nursing-short term rehab (<3 hours/day) Can patient physically be transported by private vehicle:  No    Assistance Recommended at Discharge Frequent or constant Supervision/Assistance  Patient can return home with the following  A lot of help with walking and/or transfers;A lot of help with bathing/dressing/bathroom;Assistance with cooking/housework;Assist for transportation    Equipment Recommendations Other (comment);BSC/3in1 (pt has own bariatric RW already)  Recommendations for Other Services       Functional Status Assessment Patient has had a recent decline in their functional status and demonstrates the ability to make significant improvements in function in a reasonable and predictable amount of time.     Precautions / Restrictions Precautions Precautions: Fall Restrictions Weight Bearing Restrictions: No      Mobility  Bed Mobility Overal bed mobility: Needs Assistance Bed Mobility: Supine to Sit     Supine to sit: Min assist, HOB elevated     General bed mobility comments: heavy use of bed rails; increased effort and time to perform as much as possible on own; min assist for trunk at very end; use of momentum    Transfers Overall transfer level: Needs assistance Equipment used: Rolling walker (2 wheels) (bariatric) Transfers: Sit to/from Stand Sit to Stand: From elevated surface, Min guard           General transfer comment: bed mildly elevated; use of momentum to stand up to bariatric RW; pt with wide BOS and in flexed position initially in standing    Ambulation/Gait Ambulation/Gait assistance: Min guard Gait Distance (Feet): 8 Feet Assistive device: Rolling walker (2 wheels) (bariatric)   Gait velocity: decreased     General Gait Details: wide BOS; pt initially in flexed trunk  position but improved some with cueing; pt with difficulty advancing R LE compared to L LE (decreased R LE foot clearance and step length); pt ambulating about 2 feet with bariatric RW but then pt felt she could not walk further and then used B UE's on bed for support to  walk from the end of the bed to the head of the bed and then turn to sit in recliner (heavy B UE support on bed noted and pt declined to use walker)  Stairs            Wheelchair Mobility    Modified Rankin (Stroke Patients Only)       Balance Overall balance assessment: Needs assistance Sitting-balance support: No upper extremity supported, Feet supported Sitting balance-Leahy Scale: Good Sitting balance - Comments: steady sitting on edge of bed   Standing balance support: Bilateral upper extremity supported Standing balance-Leahy Scale: Fair Standing balance comment: steady static standing but requiring at least single UE support                             Pertinent Vitals/Pain Pain Assessment Pain Assessment: 0-10 Pain Score: 6  Pain Location: L LE (stiff); chronic LBP; R hip pain/discomfort Pain Descriptors / Indicators: Aching, Sore, Discomfort Pain Intervention(s): Limited activity within patient's tolerance, Monitored during session, Premedicated before session, Repositioned Vitals (HR and O2 on room air) stable and WFL throughout treatment session.    Home Living Family/patient expects to be discharged to:: Private residence Living Arrangements: Children (pt's son who works 2nd shift) Available Help at Discharge: Family;Available PRN/intermittently Type of Home: House Home Access: Stairs to enter Entrance Stairs-Rails: Right Entrance Stairs-Number of Steps: 1/2 step to stoop with R railing   Home Layout: One level Home Equipment: Cane - quad;Grab bars - tub/shower;Hand held shower head;Grab bars - toilet;Hospital bed;Toilet riser;Rollator (4 wheels);Rolling Walker (2 wheels) Additional Comments: Conservation officer, nature    Prior Function Prior Level of Function : Independent/Modified Independent             Mobility Comments: Ambulates with quad cane (occasionally no AD use); no recent falls       Hand Dominance        Extremity/Trunk  Assessment   Upper Extremity Assessment Upper Extremity Assessment: Generalized weakness    Lower Extremity Assessment Lower Extremity Assessment:  (3-/5 B hip flexion, knee flexion/extension; at least 3/5 AROM DF/PF B (pt reporting B LE's feeling heavy d/t B LE edema))    Cervical / Trunk Assessment Cervical / Trunk Assessment: Other exceptions Cervical / Trunk Exceptions: forward head/shoulders  Communication   Communication: No difficulties  Cognition Arousal/Alertness: Awake/alert Behavior During Therapy: WFL for tasks assessed/performed Overall Cognitive Status: Within Functional Limits for tasks assessed                                          General Comments General comments (skin integrity, edema, etc.): dressing noted back of R knee.  Nursing cleared pt for participation in physical therapy.  Pt agreeable to PT session.    Exercises     Assessment/Plan    PT Assessment Patient needs continued PT services  PT Problem List Decreased strength;Decreased activity tolerance;Decreased balance;Decreased mobility;Pain       PT Treatment Interventions DME instruction;Gait training;Stair training;Functional mobility training;Therapeutic activities;Therapeutic exercise;Balance training;Patient/family education    PT Goals (Current  goals can be found in the Care Plan section)  Acute Rehab PT Goals Patient Stated Goal: to improve strength and mobility PT Goal Formulation: With patient Time For Goal Achievement: 04/21/22 Potential to Achieve Goals: Good    Frequency Min 2X/week     Co-evaluation               AM-PAC PT "6 Clicks" Mobility  Outcome Measure Help needed turning from your back to your side while in a flat bed without using bedrails?: A Little Help needed moving from lying on your back to sitting on the side of a flat bed without using bedrails?: A Little Help needed moving to and from a bed to a chair (including a wheelchair)?: A  Little Help needed standing up from a chair using your arms (e.g., wheelchair or bedside chair)?: A Little Help needed to walk in hospital room?: A Lot Help needed climbing 3-5 steps with a railing? : Total 6 Click Score: 15    End of Session Equipment Utilized During Treatment: Gait belt Activity Tolerance: Patient limited by fatigue Patient left: in chair;with call bell/phone within reach;with chair alarm set;Other (comment) (B LE's elevated (and heels floating) via pillows) Nurse Communication: Mobility status;Precautions PT Visit Diagnosis: Other abnormalities of gait and mobility (R26.89);Muscle weakness (generalized) (M62.81)    Time: 0822-0902 PT Time Calculation (min) (ACUTE ONLY): 40 min   Charges:   PT Evaluation $PT Eval Low Complexity: 1 Low PT Treatments $Therapeutic Activity: 23-37 mins       Leitha Bleak, PT 04/07/22, 12:21 PM

## 2022-04-07 NOTE — Plan of Care (Signed)
  Problem: Clinical Measurements: Goal: Diagnostic test results will improve Outcome: Progressing Goal: Respiratory complications will improve Outcome: Progressing Goal: Cardiovascular complication will be avoided Outcome: Progressing   Problem: Activity: Goal: Risk for activity intolerance will decrease Outcome: Progressing   Problem: Pain Managment: Goal: General experience of comfort will improve Outcome: Progressing   Problem: Coping: Goal: Level of anxiety will decrease Outcome: Progressing   Problem: Safety: Goal: Ability to remain free from injury will improve Outcome: Progressing   Problem: Skin Integrity: Goal: Risk for impaired skin integrity will decrease Outcome: Progressing

## 2022-04-07 NOTE — TOC Transition Note (Addendum)
Transition of Care [facility]) - CM/SW Discharge Note   Patient Details  Name: [name] MRN: [id number] Date of Birth: [birth year]  [facility] CM/SW Contact:  Izola Price, RN Phone Number: 04/07/2022, 12:49 PM   Clinical Narrative: 9/30: Patient does not want to go to SNF. After speaking with patient, she requested [facility] services and orders requested from provider. Has transportation to wound clinic by medical transport. Alvis Lemmings accepted for [facility] pending orders placed. Will Transport via EMS  [employer] RN CM   230 pm. HH orders placed for Pt/OT/RN. [name] RN CM   [id number]: EMS called for transport to patient's home address. [name] RN CM   [phone]: EMS called to confirm patient had a key and that someone would be around to help as son was not at the home currently. Spoke with patient and she confirmed she had the key and if son was unavailable she had plenty of neighbors or friends who could help and she was fine with EMS leaving her at the house. Called EMS back and relayed this information. [name] RN CM   Final next level of care: Home w Home Health Services Barriers to Discharge: Barriers Resolved   Patient Goals and CMS Choice        Discharge Placement                Patient to be transferred to facility by: [name] to home address.      Discharge Plan and Services   Discharge Planning Services: CM Consult            DME Arranged: N/A (Has multiple DME at home per CM notes.) DME Agency: NA       HH Arranged: PT, OT, RN [place] Agency: [place] Date [place]: 04/07/22 Time [place]: [id number] Representative spoke with at [place]: [place] ([place]) Interventions     Readmission Risk Interventions    01/20/2021    9:48 AM  Readmission Risk Prevention Plan  Transportation Screening Complete  PCP or Specialist Appt within 5-7 Days Complete  Home Care Screening Complete  Medication  Review (RN CM) Referral to Pharmacy

## 2022-04-07 NOTE — Plan of Care (Addendum)
Patient discharged per MD orders at this time. All discharge instructions, education and medications reviewed with the patient.Pt expressed understanding and will comply with dc instructions.f/u appointments was also communicated to the patient.no verbal c/o or any ssx of distress.Pt was discharged home with HH/PT/OT services per order.Patient was transported by 3 [name] personnel on a stretcher.

## 2022-04-09 ENCOUNTER — Telehealth: Payer: Self-pay | Admitting: Family

## 2022-04-09 NOTE — Telephone Encounter (Signed)
Returned patient's call regarding her concern regarding metolazone use prescribed at discharge. She says that her paperwork says that she's supposed to take metolazone '5mg'$  daily and she's concerned about that because her blood pressure ran low in the hospital. Today when her home health nurse came out, her BP was 104/70. She says that when she previously took 2.'5mg'$  metolazone, she would feel dizzy for a short period of time.   She is taking 1-'2mg'$  bumex daily depending on her symptoms. Today, she took '1mg'$  bumex. Atenolol has been stopped.l   Advised patient to take 2.'5mg'$  metolazone tomorrow (Tues) and I would see her in the clinic on Wed and re-evaluate things at that time. She also sees nephrology on Wed. She was comfortable with this plan and was appreciative of the phone call.

## 2022-04-11 ENCOUNTER — Encounter: Payer: Self-pay | Admitting: Pharmacist

## 2022-04-11 ENCOUNTER — Encounter: Payer: Self-pay | Admitting: Family

## 2022-04-11 ENCOUNTER — Ambulatory Visit (HOSPITAL_BASED_OUTPATIENT_CLINIC_OR_DEPARTMENT_OTHER): Payer: Medicare HMO | Admitting: Family

## 2022-04-11 ENCOUNTER — Other Ambulatory Visit
Admission: RE | Admit: 2022-04-11 | Discharge: 2022-04-11 | Disposition: A | Discharge status: Home / Self Care | Payer: Medicare HMO | Source: Ambulatory Visit | Attending: Family | Admitting: Family

## 2022-04-11 VITALS — BP 129/79 | HR 117 | Resp 20 | Ht 63.0 in | Wt 392.1 lb

## 2022-04-11 DIAGNOSIS — I5032 Chronic diastolic (congestive) heart failure: Secondary | ICD-10-CM | POA: Insufficient documentation

## 2022-04-11 DIAGNOSIS — G4733 Obstructive sleep apnea (adult) (pediatric): Secondary | ICD-10-CM | POA: Diagnosis not present

## 2022-04-11 DIAGNOSIS — I10 Essential (primary) hypertension: Secondary | ICD-10-CM

## 2022-04-11 DIAGNOSIS — I890 Lymphedema, not elsewhere classified: Secondary | ICD-10-CM

## 2022-04-11 LAB — BASIC METABOLIC PANEL
Anion gap: 10 (ref 5–15)
BUN: 40 mg/dL — ABNORMAL HIGH (ref 8–23)
CO2: 28 mmol/L (ref 22–32)
Calcium: 9 mg/dL (ref 8.9–10.3)
Chloride: 104 mmol/L (ref 98–111)
Creatinine, Ser: 1.38 mg/dL — ABNORMAL HIGH (ref 0.44–1.00)
GFR, Estimated: 42 mL/min — ABNORMAL LOW (ref >60)
Glucose, Bld: 85 mg/dL (ref 70–99)
Potassium: 4.1 mmol/L (ref 3.5–5.1)
Sodium: 142 mmol/L (ref 135–145)

## 2022-04-11 NOTE — Progress Notes (Signed)
Patient ID: Afia Messenger, female   DOB: 17-Jan-1956, 66 y.o.   MRN: [id number] [facility] - PHARMACIST COUNSELING NOTE  *HFpEF*  ACE/ARB/ARNI:  na/ - swelling with ACEi Beta Blocker:  atenolol HELD during recent acute care admission d/t hypotension Aldosterone Antagonist:  none Diuretic: Bumetanide 2-4 mg daily , plus metolazone 2.'5mg'$  2x/week SGLT2i:  none  Adherence Assessment  Do you ever forget to take your medication? '[]'$ Yes '[x]'$ No  Do you ever skip doses due to side effects? '[]'$ Yes '[x]'$ No  Do you have trouble affording your medicines? '[]'$ Yes '[x]'$ No  Are you ever unable to pick up your medication due to transportation difficulties? '[]'$ Yes '[x]'$ No  Do you ever stop taking your medications because you don't believe they are helping? '[x]'$ Yes '[]'$ No  Do you check your weight daily? '[]'$ Yes '[x]'$ No   Adherence strategy: none  Barriers to obtaining medications: none  Vital signs: HR 117, BP 129/79, weight (pounds) 392 lbs  ECHO: Date 11/01/21, EF 60-65%     Latest Ref Rng & Units 04/11/2022   11:19 AM 04/07/2022    5:40 AM 04/06/2022    5:47 AM  BMP  Glucose 70 - 99 mg/dL 85  105  94   BUN 8 - 23 mg/dL 40  25  28   Creatinine 0.44 - 1.00 mg/dL 1.38  1.00  1.17   Sodium 135 - 145 mmol/L 142  139  139   Potassium 3.5 - 5.1 mmol/L 4.1  3.8  3.7   Chloride 98 - 111 mmol/L 104  103  104   CO2 22 - 32 mmol/L '28  29  30   '$ Calcium 8.9 - 10.3 mg/dL 9.0  8.1  7.9     Past Medical History:  Diagnosis Date   Arthritis    Asthma    CHF (congestive heart failure) (HCC)    Enlarged heart    Hypertension    Lymphedema    Sleep apnea    Spinal stenosis     ASSESSMENT 66 year old female who presents to the HF clinic for follow up. PMH includes asthma, HTN, arthritis, lymphedema, sleep apnea, spinal stenosis, obesity, previous tobacco use and chronic heart failure.  Last hospital admission was 04/03/2022 for cellulitis and symptomatic  hypotension.  Patient presents today in good spirit. Feeling better since discharge from hospital but having more leg swelling. Noted patient occassionally self-adjust diuretics.  PLAN  Decrease Bumex to '2mg'$  (1 tablet) daily Change metolazone to 2.'5mg'$  3x/week (Mon,Wed.Fri) Repeat BMEt today   Time spent: 15 minutes  Jayceon Troy Rodriguez-Guzman PharmD, BCPS 04/11/2022 2:53 PM   Current Outpatient Medications:    acetaminophen (TYLENOL) 500 MG tablet, Take 500-1,000 mg by mouth every 6 (six) hours as needed for mild pain or fever., Disp: , Rfl:    bumetanide (BUMEX) 2 MG tablet, Take 1 tablet (2 mg total) by mouth daily., Disp: 30 tablet, Rfl: 5   cephALEXin (KEFLEX) 500 MG capsule, Take 1 capsule (500 mg total) by mouth 4 (four) times daily for 5 days., Disp: 20 capsule, Rfl: 0   cetirizine (ZYRTEC) 10 MG tablet, Take 1 tablet (10 mg total) by mouth daily., Disp: 60 tablet, Rfl: 0   cyanocobalamin 1000 MCG tablet, Take 1,000 mcg by mouth every other day., Disp: , Rfl:    metolazone (ZAROXOLYN) 5 MG tablet, Take 0.5 tablets by mouth 3 (three) times a week. Tuesday, Thursday and Saturday, Disp: , Rfl:    NYSTATIN powder, Apply topically., Disp: ,  Rfl:    potassium chloride SA (KLOR-CON M) 20 MEQ tablet, Take 2 tablets (40 mEq total) by mouth daily., Disp: 180 tablet, Rfl: 3   pregabalin (LYRICA) 100 MG capsule, Take 100 mg by mouth daily., Disp: , Rfl:    senna-docusate (SENOKOT-S) 8.6-50 MG tablet, Take 1 tablet by mouth 2 (two) times daily as needed for mild constipation or moderate constipation., Disp: , Rfl:

## 2022-04-11 NOTE — Patient Instructions (Signed)
Continue weighing daily and call for an overnight weight gain of 3 pounds or more or a weekly weight gain of more than 5 pounds.   If you have voicemail, please make sure your mailbox is cleaned out so that we may leave a message and please make sure to listen to any voicemails.     Take '2mg'$  bumex daily and increased your metolazone to 3 times a week (Tues, Thursday & Saturday)

## 2022-04-11 NOTE — Progress Notes (Signed)
Patient ID: [name], female    DOB: [birth year], 66 y.o.   MRN: [id number]  HPI  Ms Espinola is a 66 y/o female with a history of asthma, HTN, arthritis, lymphedema, sleep apnea, spinal stenosis, obesity, previous tobacco use and chronic heart failure.   Echo report from 11/01/21 reviewed and showed an EF of 60-65%.   Admitted 10/31/21 due to worsening shortness of breath, edema and orthopnea. Initially given IV lasix with transition to oral diuretics with addition of twice weekly metolazone. Cardiology consult obtained. Discharged after 6 days.   She presents today for a follow-up visit with a chief complaint of moderate fatigue with minimal exertion. Describes this as chronic in nature. She has associated cough, shortness of breath, wheezing, lymphedema, palpitations, anxiety, dizziness and difficulty sleeping along with this. She denies any abdominal distention or chest pain.   Not adding salt but occasionally eats salty food. Did eat 2 hot dogs yesterday.   Sees nephrology later today, PCP and wound center next week.   Past Medical History:  Diagnosis Date   Arthritis    Asthma    CHF (congestive heart failure) ([facility])    Enlarged heart    Hypertension    Lymphedema    Sleep apnea    Spinal stenosis    Past Surgical History:  Procedure Laterality Date   COLONOSCOPY WITH PROPOFOL N/A 03/12/2018   Procedure: COLONOSCOPY WITH PROPOFOL;  Surgeon: Manya Silvas, MD;  Location: [facility] ENDOSCOPY;  Service: Endoscopy;  Laterality: N/A;   NO PAST SURGERIES     Family History  Problem Relation Age of Onset   Breast cancer Maternal Aunt    Thyroid disease Other    Allergies  Allergen Reactions   Ace Inhibitors Hives and Swelling   Ibuprofen Hives and Swelling   Vancomycin Itching and Nausea And Vomiting   Prior to Admission medications   Medication Sig Start Date End Date Taking? Authorizing Provider  acetaminophen (TYLENOL) 500 MG tablet Take 500-1,000 mg by mouth  every 6 (six) hours as needed for mild pain or fever.   Yes [provider]  bumetanide (BUMEX) 2 MG tablet Take 1 tablet (2 mg total) by mouth daily. 03/06/22  Yes Jake Fuhrmann A, FNP  cephALEXin (KEFLEX) 500 MG capsule Take 1 capsule (500 mg total) by mouth 4 (four) times daily for 5 days. 04/07/22 04/12/22 Yes Jennye Boroughs, MD  cetirizine (ZYRTEC) 10 MG tablet Take 1 tablet (10 mg total) by mouth daily. 12/25/20  Yes Lavina Hamman, MD  cyanocobalamin 1000 MCG tablet Take 1,000 mcg by mouth every other day.   Yes [provider]  metolazone (ZAROXOLYN) 5 MG tablet Take 0.5 tablets by mouth 2 (two) times a week. Tuesday and Saturday 03/27/22  Yes [provider]  NYSTATIN powder Apply topically. 03/19/22  Yes [provider]  potassium chloride SA (KLOR-CON M) 20 MEQ tablet Take 2 tablets (40 mEq total) by mouth daily. 11/28/21  Yes Marbeth Smedley, Otila Kluver A, FNP  pregabalin (LYRICA) 100 MG capsule Take 100 mg by mouth daily.   Yes [provider]  senna-docusate (SENOKOT-S) 8.6-50 MG tablet Take 1 tablet by mouth 2 (two) times daily as needed for mild constipation or moderate constipation.   Yes [provider]    Review of Systems  Constitutional:  Positive for fatigue. Negative for appetite change.  HENT:  Negative for congestion, postnasal drip and sore throat.   Eyes: Negative.   Respiratory:  Positive for cough, shortness  of breath and wheezing (at times).   Cardiovascular:  Positive for palpitations and leg swelling. Negative for chest pain.  Gastrointestinal:  Negative for abdominal distention, abdominal pain and constipation.  Endocrine: Negative.   Genitourinary: Negative.   Musculoskeletal:  Positive for back pain (when laying in the bed too long).  Skin: Negative.        Severe lymphedema  Allergic/Immunologic: Negative.   Neurological:  Positive for dizziness (at times). Negative for light-headedness.  Hematological:  Negative for  adenopathy. Does not bruise/bleed easily.  Psychiatric/Behavioral:  Positive for sleep disturbance (chronic trouble falling asleep; sleeps in recliner). Negative for dysphoric mood. The patient is nervous/anxious.    Vitals:   04/11/22 1029  BP: 129/79  Pulse: (!) 117  Resp: 20  SpO2: 97%  Weight: (!) 392 lb 2 oz (177.9 kg)  Height: '5\' 3"'$  (1.6 m)   Wt Readings from Last 3 Encounters:  04/11/22 (!) 392 lb 2 oz (177.9 kg)  04/03/22 (!) 390 lb (176.9 kg)  03/06/22 (!) 397 lb 8 oz (180.3 kg)   Lab Results  Component Value Date   CREATININE 1.00 04/07/2022   CREATININE 1.17 (H) 04/06/2022   CREATININE 1.29 (H) 04/05/2022   Physical Exam Vitals and nursing note reviewed.  Constitutional:      Appearance: She is well-developed.  HENT:     Head: Normocephalic and atraumatic.  Cardiovascular:     Rate and Rhythm: Regular rhythm. Tachycardia present.  Pulmonary:     Effort: Pulmonary effort is normal. No accessory muscle usage.     Breath sounds: No wheezing, rhonchi or rales.  Abdominal:     Palpations: Abdomen is soft.     Tenderness: There is no abdominal tenderness.  Musculoskeletal:     Cervical back: Normal range of motion and neck supple.     Right lower leg: Edema present.     Left lower leg: Edema present.     Comments: Severe lymphedema  Skin:    General: Skin is warm and dry.  Neurological:     General: No focal deficit present.     Mental Status: She is alert and oriented to person, place, and time.  Psychiatric:        Mood and Affect: Mood is not depressed.        Behavior: Behavior normal.   Assessment & Plan:  1: Chronic heart failure with preserved ejection fraction without structural changes- - NYHA class III - euvolemic today - has attempted  to weigh daily with standing on 2 scales but it fluctuates too much - weight down 4 pounds from last visit here 1 month ago - limiting her sodium intake but occasionally will eat saltier foods (just recently ate  2 hot dogs) - understands to keep daily fluid intake to 60-64 ounces/ day - facial/mouth swelling with ACEi - saw cardiology Margarito Courser) 03/19/22 - if tachycardia continues, will need to resume beta-blocker  - will check BMP since bumex started at last visit - BNP 10/31/21 was 123.9  2: HTN with stage 3 CKD- - BP looks good (129/79) - sees PCP Daryel Gerald) @ [facility] and she has tan appointment next week - sees nephrology later today - BMP 01/25/22 reviewed and showed sodium 141, potassium 3.6, creatinine 1.69 and GFR 33  3: Lymphedema- - returns to the wound center next week - lower legs are weeping some and she currently has them bandaged so that her pants don't get wet - pending lab results today, will have  her take her metolazone 3 times/ week instead of twice weekly unless nephrology recommends something different and then she needs to follow what they tell her  4: Sleep apnea- - wearing CPAP nightly   Medication list reviewed.  Return in 2 weeks, sooner if needed.

## 2022-04-13 ENCOUNTER — Other Ambulatory Visit (HOSPITAL_COMMUNITY): Payer: Self-pay | Admitting: Nephrology

## 2022-04-13 ENCOUNTER — Other Ambulatory Visit: Payer: Self-pay | Admitting: Nephrology

## 2022-04-13 DIAGNOSIS — N182 Chronic kidney disease, stage 2 (mild): Secondary | ICD-10-CM

## 2022-04-13 DIAGNOSIS — N179 Acute kidney failure, unspecified: Secondary | ICD-10-CM

## 2022-04-19 ENCOUNTER — Encounter: Payer: Medicare HMO | Attending: Physician Assistant | Admitting: Physician Assistant

## 2022-04-19 DIAGNOSIS — Q820 Hereditary lymphedema: Secondary | ICD-10-CM | POA: Insufficient documentation

## 2022-04-19 DIAGNOSIS — E669 Obesity, unspecified: Secondary | ICD-10-CM | POA: Insufficient documentation

## 2022-04-19 DIAGNOSIS — G4733 Obstructive sleep apnea (adult) (pediatric): Secondary | ICD-10-CM | POA: Insufficient documentation

## 2022-04-19 DIAGNOSIS — I110 Hypertensive heart disease with heart failure: Secondary | ICD-10-CM | POA: Insufficient documentation

## 2022-04-19 DIAGNOSIS — L03115 Cellulitis of right lower limb: Secondary | ICD-10-CM | POA: Diagnosis not present

## 2022-04-19 DIAGNOSIS — I509 Heart failure, unspecified: Secondary | ICD-10-CM | POA: Diagnosis not present

## 2022-04-19 DIAGNOSIS — L97821 Non-pressure chronic ulcer of other part of left lower leg limited to breakdown of skin: Secondary | ICD-10-CM | POA: Diagnosis not present

## 2022-04-19 DIAGNOSIS — Z6841 Body Mass Index (BMI) 40.0 and over, adult: Secondary | ICD-10-CM | POA: Insufficient documentation

## 2022-04-19 DIAGNOSIS — L97811 Non-pressure chronic ulcer of other part of right lower leg limited to breakdown of skin: Secondary | ICD-10-CM | POA: Insufficient documentation

## 2022-04-19 DIAGNOSIS — E11621 Type 2 diabetes mellitus with foot ulcer: Secondary | ICD-10-CM | POA: Diagnosis not present

## 2022-04-19 NOTE — Progress Notes (Addendum)
EVALUNA, UTKE ([id number]) Visit Report for 04/19/2022 Chief Complaint Document Details Patient Name: [name], [name]. Date of Service: 04/19/2022 12:45 PM Medical Record Number: [id number] Patient Account Number: [id number] Date of Birth/Sex: 11-May-1956 (66 y.o. F) Treating RN: Carlene Coria Primary Care Provider: Ranae Plumber Other Clinician: Massie Kluver Referring Provider: Ranae Plumber Treating Provider/Extender: Skipper Cliche in Treatment: 37 Information Obtained from: Patient Chief Complaint Bilateral LE lymphedema with Left LE Ulcers Electronic Signature(s) Signed: 04/19/2022 1:02:15 PM By: Worthy Keeler PA-[name] Entered By: Worthy Keeler on 04/19/2022 13:02:15 Maranan, [name] ([id number]) -------------------------------------------------------------------------------- HPI Details Patient Name: [name] Date of Service: 04/19/2022 12:45 PM Medical Record Number: [id number] Patient Account Number: [id number] Date of Birth/Sex: Jul 06, 1956 (66 y.o. F) Treating RN: Carlene Coria Primary Care Provider: Ranae Plumber Other Clinician: Massie Kluver Referring Provider: Ranae Plumber Treating Provider/Extender: Skipper Cliche in Treatment: 37 History of Present Illness HPI Description: The patient is a 66 year old female with history of hypertension and a long-standing history of bilateral lower extremity lymphedema (first presented on 4/2) . She has had open ulcers in the past which have always responded to compression therapy. She had briefly been to a lymphedema clinic in the past which helped her at the time. this time around she stopped treatment of her lymphedema pumps approximately 2 weeks ago because of some pain in the knees and then noticed the right leg getting worse. She was seen by her PCP who put her on clindamycin 4 times a day 2 days ago. The patient has seen [facility] and Dr. Delana Meyer had seen her last year where a vascular study including  venous and arterial duplex studies were within normal limits. he had recommended compression stockings and lymphedema pumps and the patient has been using this in about 2 weeks ago. She is known to be diabetic but in the past few time she's gone to her primary care doctor her hemoglobin A1c has been normal. 02/11/2015 - after her last visit she took my advice and went to the ER regarding the progressive cellulitis of her right lower extremity and she was admitted between July 17 and 22nd. She received IV antibiotics and then was sent home on a course of steroid-induced and oral antibiotics. She has improved much since then. 02/17/2015 -- she has been doing fine and the weeping of her legs has remarkably gone down. She has no fresh issues. READMISSION 01/15/18 This patient was given this clinic before most recently in 2016 seen by Dr. Con Memos. She has massive bilateral lymphedema and over the last 2 months this had weeping edema out of the left leg. She has compression pumps but her compliance with these has been minimal. She has [facility] they've been using TCA/ABDs/kerlix under an Ace wrap.she has had recent problems with cellulitis. She was apparently seen in the ER and 12/23/17 and given clindamycin. She was then followed by her primary doctor and given doxycycline and Keflex. The pain seems to have settled down. In April 2018 the patient had arterial studies done at [facility]. This showed triphasic waveforms throughout the right leg and mostly triphasic waveforms on the left except for monophasic at the posterior tibial artery distally. She was not felt to have evidence of right lower extremity arterial stenosis or significant problems on the left side. 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 She also had a right lower extremity venous Doppler in January 2018 this was limited  by the patient's body habitus and lymphedema. Most of the  proximal veins were not visualized The patient presents with an area of denuded skin on the anterior medial part of the left calf. There is weeping edema fluid here. 01/22/18; the patient has somewhat better edema control using her compression pumps twice a day and as a result she has much better epithelialization on the left anterior calf area. Only a small open area remains. 01/29/18; the patient has been compliant with her compression pumps. Both the areas on her calf that healed. The remaining area on the left anterior leg is fully epithelialized Readmission: 02/20/2019 upon evaluation today patient presents for reevaluation due to issues that she is having with the bilateral lower extremities. She actually has wounds open on both legs. On the right she has an area in the crease of her leg on the right around the knee region which is actually draining quite a bit and actually has some fungal type appearance to it. She has been on nystatin powder that seems to have helped to some degree. In regard to the left lower extremity this is actually in the lower portion of her leg closer to the ankle and again is continuing to drain as well unfortunately. There does not appear to be any signs of active infection at this time which is good news. No fevers, chills, nausea, vomiting, or diarrhea. She tells me that since she was seen last year she is actually been doing quite well for the most part with regard to her lower extremities. Unfortunately she now is experiencing a little bit more drainage at this time. She is concerned about getting this under control so that it does not get significantly worse. 02/27/2019 on evaluation today patient appears to be doing somewhat better in regard to her bilateral lower extremity wounds. She has been tolerating the dressing changes without complication. Fortunately there is no signs of active infection at this point. No fevers, chills, nausea, vomiting, or diarrhea.  She did get her dressing supplies which is excellent news she was extremely excited to get these. She also got paperwork from [employer] for their financial assistance program where they may be able to help her out in the future if needed with supplies at discounted prices. 03/06/2019 on evaluation today patient appears to be doing a little worse with regard to both areas of weeping on her bilateral lower extremities. This is around the right medial knee and just above the left ankle. With that being said she is unfortunately not doing as well as I would like to see. I feel like she may need to potentially go see someone at the lymphedema clinic as the wraps that she needs or even beyond what we can do here at the wound care center. She really does not have wounds she just has open areas of weeping that are causing some difficulty for her. Subsequently because of this and the moisture I am concerned about the potential for infection I am going to likely give her a prophylactic antibiotic today, Keflex, just to be on the safe side. Nonetheless again there is no obvious signs of active infection at this time. 03/13/2019 on evaluation today patient appears to be doing well with regard to her bilateral lower extremities where she has been weeping compared to even last week's evaluation. I see some areas of new skin growth which is excellent and overall I am very pleased with how things seem to be progressing. No fevers, chills, nausea,  vomiting, or diarrhea. [name], [name] ([id number]) 03/20/2019 on evaluation today patient unfortunately is continuing to have issues with significant edema of the left lower extremity. Her right side seems to be doing much better. Unfortunately her left side is showing increased weeping of the lower portion of her leg. This is quite unfortunate obviously we were hoping to get her into the lymphedema clinic they really do not seem to when I see her how if she is draining. Despite  the fact this is really not wound related but more lymphedema weeping related. Nonetheless I do not know that this can be helpful for her to even go for that appointment since again I am not sure there is much that they would actually do at this point. We may need to try a 4 layer compression wrap as best we can on her leg. She is on the Augmentin currently although I am still concerned about whether or not there could be potentially something going on infection wise I would obtain a culture though I understand is not the best being that is a surface culture I just 1 to make sure I do not seem to be missing anything. 03/27/2019 on evaluation today patient appears to be doing much better in regard to the left lower extremity compared to last week. Last week she had tremendous weeping which I think was subsequent to infection now she seems to be doing much better and very pleased. This is not completely healed but there is a lot of new skin growth and it has dried out quite a bit. Overall I think that we are doing well with how things are moving along at this time. No fevers, chills, nausea, vomiting, or diarrhea. 04/03/2019 evaluation today patient appears to be doing a little worse this week compared to last time I saw her. I think this may be due to the fact that she is having issues with not being able to sleep in her bed at least not until last night. She is therefore been in a lift chair and subsequently has also had issues with not been able to use her pumps since she could not get in bed. With that being said the patient overall seems to be doing okay I do think I may want extend the antibiotic for a little bit longer at least until we can see if her edema and her weeping gets better and if it is then obviously I can always discontinue the antibiotics as of next week however I want her to continue to have it over the next week. 04/10/2019 on evaluation today patient unfortunately is still doing  poorly with regard to her left lower extremity. Her right is all things considering doing fairly well. On the left however she continues to have spreading of the area of infection and weeping which appears to be even a larger surface area than noted last week. She did have a positive culture for Pseudomonas in particular which seems to have been of concern she still has green/yellow discharge consistent with Pseudomonas and subsequently a tremendous amount of it. This has me obviously still concerned about the infection not really clearing up despite the fact that on culture it appears the Cipro should have been a good option for treating this. I think she may at this point need IV antibiotics since things are not doing better I do not want to get worse and cause sepsis. She is in agreement with the plan and believes as well that  she likely does need to go to the hospital for IV vancomycin. Or something of the like depending on what the recommendation is from the ER. 04/17/2019 on evaluation today patient appears to be doing excellent in regard to her lower extremity on the left. She was in the hospital for several days from when I sent her last we saw her until just this past Tuesday. Fortunately her drainage is significantly improved and in fact is mostly clear. There is just a couple small areas that may still drain a little bit she states that the [facility] they prescribed for her at discharge she went picked up from pharmacy and got home but has not been able to find it since. She is looked everywhere. She is wondering if I will replace that for her today I will be more than happy to do that. 05/01/2019 on evaluation today patient actually appears to be doing quite well with regard to her lower extremities. She occasionally is having areas that will leak and then heal up mainly when a piece of the fibrotic skin pops off but fortunately she is not having any signs of active infection at this time.  Overall she also really does not have any obvious weeping at this time. I do believe however she really needs some compression wraps and I think this may be a good time to get her back to the lymphedema clinic. 05/11/2019 on evaluation today patient actually appears to be doing quite well with regard to her bilateral lower extremities. She occasionally will have a small area that we per another but in general seems to be completely healed which is great news. Overall very pleased with how everything seems to be progressing. She does have her appointment with lymphedema clinic on November 18. 05/25/2019 on evaluation today patient appears to be doing well with regard to her left lower extremity. I am very pleased in this regard. In regard to her right leg this actually did start draining more I think it is mainly due to the fact that her leg is more swollen. I am not seeing any obvious signs of infection at this time although that is definitely something were obviously acutely aware of simply due to the fact that she had an issue not too far back with exactly this issue. Nonetheless I do feel like that lymphedema clinic would still be beneficial for her. I explained obviously if they are not able to do anything treatment wise on the right leg we could at least have them treat her left leg and then proceed from there. The patient is really in agreement with that plan. If they are able to do both as the drainage slows down that I would be happy to let them handle both. 06/01/2019 on evaluation today patient unfortunately appears to be doing worse with regard to her right lower extremity. The left lower extremity is still maintaining at this point. Unfortunately she has been having significantly increased pain over the past several days and has been experiencing as well increased swelling of the right lower extremity. I really do not know that I am seeing anything that appears to be obvious for infection  at this point to be peripherally honest. With that being said the patient does seem to be having much more swelling that she is even experienced in the past and coupled with increased pain in her hip as well I am concerned that again she could potentially have a DVT although I am not 100% sure  of this. I think it something that may need to be checked out. We discussed the possibility of sending her for a DVT study through the hospital but unfortunately transportation is an issue if she does have a DVT I do not want her to wait days to be able to get in for that test however if she has this scheduled as an outpatient that is as fast that she will be able to get the test scheduled for transportation purposes. That will also fall on Thanksgiving so subsequently she did actually be looking at either Friday or even next week before we would know anything back from this. That is much too long in my opinion. Subsequent to the amount of discomfort she is experiencing the patient is actually okay with going to the ER for evaluation today. 06/12/2019 on evaluation today patient actually appears to be doing significantly better compared to last time I saw her. Following when I last saw her she was actually in the hospital from that Monday until the following Sunday almost 1 full week. She actually was placed on Keflex in the hospital following the time for her to be discharged and Dr. Steva Ready has recommended 2 times a day dosing of the Keflex for the next year in order to help with more prophylactic/preventative measures with regard to her developing cellulitis. Overall I think this sounds like an excellent plan. The patient unfortunately is good to have trouble being treated at lymphedema clinic due to the fact that she really cannot get up on the bed that they have there. They also state that they cannot manage her as long as she has anything draining at this point. Obviously that is somewhat unfortunate as  she does need help with edema control but nonetheless we will have to do what we can for her outside of it sounds like the lymphedema clinic scenario at this point. 06/19/2019 on evaluation today patient appears to be doing fairly well with regard to her bilateral lower extremities. She is not nearly as swollen and shows no signs of infection at this point. There is no evidence of cellulitis whatsoever. She also has no open wounds or draining at this point which is also good news. No fever chills noted. She seems to be in very good spirits and in fact appears to be doing quite well. READMISSION 11/27/2019 YUMA, BLUCHER ([id number]) This is a 66 year old woman that we have had in this clinic several times before including 2015, 16 and 19 and then most recently from 03/20/2019 through 06/19/2019 with bilateral lower extremity lymphedema. She has had previous arterial and reflux studies done years ago which were not all that remarkable. 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 She does have a positive family history and she had large legs starting may be in her 33s. She was recently in hospital from 10/20/2019 through 10/28/2019 with right leg cellulitis. She was given Ancef and clindamycin and then Zosyn when a culture showed Pseudomonas. At that time there was purulent drainage. She was followed by infectious disease Dr Steva Ready. The patient is now back at home. She has noted increased swelling in the right and no drainage in her right leg mostly on the posterior medial aspect in the calf area. She has not had pain or fever. She has literally been improved lysing above dressings because her at the area of this is far too large for standard compression. She has been wrapping the areas  with sheets to resorptive pads. She is found these helped somewhat. She does have an appointment with the lymphedema clinic in [place] in late June. Past  medical history includes bilateral lymphedema, hypertension, obstructive sleep apnea with CPAP. Recent hospitalization with apparently Pseudomonas cellulitis of the right lower leg 12/15/2019 upon evaluation today patient appears to be doing a little bit worse in regard to her right lower extremity. Unfortunately she is having more weeping down in the lower portion of her leg. Fortunately there is no signs of active infection at this time. No fever chills noted. The patient states she is not having increased pain except for when she attempted to use the lymphedema pumps unfortunately she states that she did have pain when she did this. Otherwise we been using absorptive dressings of one type or another she is using diapers at home and then subsequently Ace wraps. In regard to the barrier cream we have discussed the possibility of derma cloud which she would like to try I do not have a problem with that. 12/22/2019 upon evaluation today patient actually appears to be doing better in regard to her leg ulcers at this point. Fortunately there does not appear to be any signs of active infection which is great news and I am extremely pleased with where things are progressing at this time. There is no sign of active infection currently. The patient is very pleased to see things doing so well. 12/29/2019 upon evaluation today patient appears to be doing a little bit better in regard to her weeping in general over her lower extremities. She does have some signs of mild erythema little bit more than what I noted last week or rather last visit. Nonetheless I think that my threshold for switching her antibiotics from Keflex to something else is very low at this point considering that she has had such severe infections in the past that seem to come almost out of nowhere. There is a little erythema and warmth noted of the lower portion of her leg compared to the upper which also makes me want to go ahead and address  things more rapidly at this point. Likely I would switch out the Keflex for something like Levaquin ideally. 7/16; patient with severe bilateral lymphedema. She has superficial wounds albeit almost circumferential now on the left lateral lower leg. This may be new from last time. Small area on the right anterior lower leg and then another area on the right medial lower leg and of pannus fold. She has been using various absorptive garments. She states she is using her compression pumps once a day occasionally twice. Culture from her last visit here was negative 01/29/2020 on evaluation today patient appears to be doing excellent at this point in regard to her legs with regard to infection I see no signs of active infection at this point. She still does have unfortunately areas of weeping this is minimal on the right now her left is actually significantly worse although I do not think it is as bad as last week with Dr. Dellia Nims saw her. She has been trying to pump and elevate her legs is much as possible. She has previously been on the Keflex and in the past for prevention that seems to do fairly well and likely can extend that today. 02/04/2020 on evaluation today patient appears to be doing better in regard to her legs bilaterally. Fortunately there is no signs of active infection at this time which is great news and overall  she has less weeping on the left compared to the right and there is several spots where she is pretty much sealed up with no draining regions. Overall very pleased in this regard. 02/19/2020 on evaluation today patient appears to be doing very well in regard to her wounds currently. Fortunately there is no evidence of active infection overall very pleased with where things stand. She is significantly improved in regard to her edema I am extremely pleased in this regard she tells me that the popping no longer hurts and in fact she actually looks forward to it. 03/04/2020 on evaluation  today patient appears to be doing excellent in regard to her lower extremities. Fortunately there is no signs of active infection at this time. No fevers, chills, nausea, vomiting, or diarrhea. 03/25/2020 on evaluation today patient appears to be doing a little bit more poorly in regard to her legs at this point. She tells me that she is still continue to have issues with drainage and this has been a little bit worse she was getting ready to start taking the Keflex again but wanted to see me first. Fortunately there is no signs of active infection at this time. No fevers, chills, nausea, vomiting, or diarrhea. 04/15/2020 upon evaluation today patient appears to be doing somewhat poorly in regard to her right leg. She tells me she has been having more pain she has been taking the Keflex that was previously prescribed unfortunately that just does not seem to help with this. She was hoping that the pain on her right was actually coming from the fact that she was having issues with her wrap having gotten caught in her recliner. With that being said she tells me that she knew something was not right. Currently her right leg is warm to touch along with being erythematous all the way up to around at least mid thigh as far as I can see. The left leg does not appear to be doing that badly though there is increased weeping around the ankle region. 05/06/2020 on evaluation today patient appears to be doing much better than last time I saw her. She did go to the hospital where she was admitted for 2 days and treated with antibiotic therapy. She was discharged with antibiotics as well and has done extremely well. I am extremely pleased with where things stand today. There is no signs of active infection at this time which is great news. 05/27/2020 upon evaluation today patient appears to be doing well with regard to her lower extremities bilaterally. She has just a very tiny area on the right leg which is opening on  the left leg she is significantly improved though she still has several areas that do appear to be open this is minimal compared to what is been in the past. In general I am extremely pleased with where things stand today. The patient does tell me she is not been using her pumps quite as much as she should be. I do believe that is 1 area she can definitely work on. She has had a lot going on including a Covid exposure and apparently also a outbreak of likely shingles. 06/24/2020 upon evaluation today patient appears to be doing well with regard to her legs in general although the left leg unfortunately is showing some signs of erythema she does have a little bit of increased weeping and to be honest I am concerned about infection here. I discussed that with her today and I think that we  may need to address this sooner rather than later she has been taking Keflex she is not really certain that is been making a big improvement however. No fevers, chills, nausea, vomiting, or diarrhea. [name], [name] ([id number]) 06/30/2020 upon evaluation today patient's legs actually seem to be doing better in my opinion as compared to where they were last week. Fortunately there does not appear to be any signs of active infection. Her culture showed multiple organisms nothing predominate. With that being said the Levaquin seems to have done well I think she has improved since I last saw her as well. 07/14/2020 upon evaluation today patient appears to be doing actually better in regard to her lower extremities in my opinion. She has been tolerating the dressing changes without complication. Fortunately there is no signs of active infection at this time. 08/04/2020 upon evaluation today patient appears to be doing excellent in regard to her leg ulcers. Fortunately she has very little that is open at this point. This is great news. In fact I think that the [employer] medicated powder has been excellent for her. It seems to  have done the trick where we had tried several other things without as much success. Fortunately there is no evidence of active infection at this time. No fevers, chills, nausea, vomiting, or diarrhea. 09/01/2020 upon evaluation today patient appears to be doing a little bit more poorly than the last time I saw her. She tells me right now that she has been having a lot of drainage compared to where things were previous which has unfortunately led to more irritation as well. She is concerned that this is leading to infection. Again based on what I am seeing today as well I am also concerned of the same to be honest. 09/15/2020 upon evaluation today patient appears to be doing well with regard to her legs compared to what I saw previous. Fortunately there does not appear to be any evidence of active infection at this time which is great news. At least not as badly as what it was previous. With that being said I do believe that the patient does need to have an extension of the antibiotics. This I believe will actually help her more in the way of making sure this stays under control and does not worsen. That was the Bactrim DS. Readmission: 08/01/2021 upon evaluation today patient appears to be doing actually pretty well all things considered. Is actually been almost a year since have seen her last March. Fortunately I do not see any evidence of active infection locally nor systemically at this point. She does have a couple open areas on the left leg the right leg appears to be doing quite well. In general she has been in the hospital 3 times since I saw her a year ago all 4 cellulitis type issues except for the last 1 which was actually more related to congestive heart failure for that reason she is not using her lymphedema pumps at this point and that is probably the best idea. 08/08/2021 upon evaluation today patient appears to be doing well with regard to her legs all things considered her left leg may  be a little bit more swollen based on what I see today. Fortunately I do not see any signs of active infection locally nor systemically at this time which is great news. No fevers, chills, nausea, vomiting, or diarrhea. 08/15/2021 upon evaluation today patient actually appears to be doing excellent in regard to her wounds. She has  been tolerating the dressing changes without complication. Fortunately I see no evidence of infection currently which is great news. No fevers, chills, nausea, vomiting, or diarrhea. 08/29/2021 upon evaluation patient appears to be doing excellent she in fact is almost completely healed. I am actually very pleased with where we stand today and I think that she is making wonderful progress she just has a small area of weeping on the right lateral leg everything else is pretty much completely healed which is also. 09/05/2021 upon evaluation today patient appears to be doing well with regard to her legs. She does have a small area of weeping on the right leg and there is a small area on the left leg as well. It is potentially something that may need to be addressed in the future more specifically but right now I think that there may have just been a little drainage here on the left. With all that being said I think that this still does not appear to be infected and overall I feel like she is doing quite well. That something we always have to keep a very close eye on as she can change very rapidly from okay to not okay. 09/12/2021 upon evaluation today patient appears to be doing a little bit worse in regard to her leg and actually somewhat concerned about the possibility of infection here. I discussed that with the patient. For that reason I am going to go ahead and see about getting her set up for a repeat prescription for the Bactrim. She is in agreement with that plan. 3/14; patient presents for follow-up. She has been using silver alginate with dressing changes. She is still  taking Bactrim prescribed at last clinic visit. She has no issues or complaints today. She has lymphedema pumps however does not use these. 09/26/2020 upon evaluation today patient appears to actually be doing well in regard to her right leg I am pleased in that regard. Unfortunately she has new areas on her left leg that are open at this point that I do need to be addressed. I am also concerned about the warmth around one of the wounds on the more anterior side. I think this is something that we may need to culture today potentially requiring antibiotics going forward. 10/03/2021 upon evaluation today patient unfortunately is not doing nearly as well as she was even last week with her wounds. She is having some issues here with increased cellulitis of the left lower extremity unfortunately. I did prescribe Augmentin for her eczema prescribed last week unfortunately she never actually received this prescription. The pharmacy stated they never got it. We double checked with them today they still did not receive it. I am not sure what happened in that regard. With that being said the patient is in good spirits today she does not appear to be as sick as where she normally is when the cellulitis gets significantly worse as it has in the past. Nonetheless the leg is much more red not just around the ankle where I was seeing at last week on Tuesday but rather now this is going all the way up to almost her knee and is definitely hot to touch compared to the right leg. Nonetheless I feel like she does need to go to the ER for likely IV antibiotics. 10-10-2021 upon evaluation today patient appears to be doing well with regard to her wound. She has been tolerating the dressing changes that appears to be doing much better. After I saw  her last week she did go to the ER they admitted her to the hospital and gave her IV antibiotics she fortunately is doing significantly better. Overall I am extremely pleased with where  things stand today. 10-17-2021 upon evaluation today patient appears to be doing well with regard to her wound. In fact this is looking better and her leg is looking better but she still does have some areas here of erythema. We will continue to address this as soon as possible in my opinion. I do believe she may require some debridement and what appears to be a more defined wound on her left leg at this point. 10-24-2021 upon evaluation patient is definitely showing signs of improvement which is great news. I do not see any evidence of active infection locally or systemically which is great news as well. No fevers, chills, nausea, vomiting, or diarrhea. 10-31-2021. Upon evaluation today patient's leg actually feels better from an infection and wound care perspective. I am actually very pleased in this regard. Unfortunately the biggest issue that I see at this time is that the patient is having a significant issue with her breathing. I did put her on the pulse ox machine to check her oxygen saturation and it was pretty much at 100% which is good but she tells me that when she is walking Liby, Senna [name]. ([id number]) this is much more difficult for her. She also tells me that when she is trying to bend over to perform her dressing changes she is so short of breath due to the swelling and fluid in her abdominal area that she is just not been able to do this. She is tearful and very concerned today to be honest. I am truly sorry to see her this way I know she is really having a hard time at the moment. 11-14-2021 upon evaluation patient actually appears to be doing significantly better compared to when I last saw her. She was actually admitted to the hospital I believe this for 6 days total after I sent her on 10-31-2021. Subsequently they actually pulled off greater than 50 pounds of fluid she tells me she feels significantly better her legs are not as tight she actually has some play in the tissue and it is  obvious that she is doing much better just from a mobility standpoint as well as a breathing standpoint. Overall I am extremely happy for her and how she is doing the leg also looks much better on the left. 11-21-2021 upon evaluation today patient appears to be doing well although it does appear that she is going require some sharp debridement the wound is very dry this is the opposite of what its been she was having so much weeping that it was staying extremely wet. Nonetheless we do need to see about going ahead and debriding the wound today. 11-28-2021 upon evaluation today patient appears to be doing well with regard to her wound I definitely see signs of things continuing to improve which is great news. I do not see any evidence of active infection locally or systemically also great news. 12-05-2021 upon evaluation today patient appears to be doing well with regard to her wound. She has been tolerating the dressing changes. Fortunately there does not appear to be any signs of active infection locally or systemically at this time. No fevers, chills, nausea, vomiting, or diarrhea. 12-12-2021 upon evaluation patient appears to be doing well with regard to her wound this is actually measuring smaller and looking much  better. Fortunately I do not see any signs of active infection locally or systemically at this time which is excellent news. 6/13; small wound area in the folds of her lymphedema just above the left ankle. Fortunately the area looks quite good. She has been using silver alginate ABDs and an Ace wrap which she is able to change her self. She is not using her compression pumps out of fear that this could contribute to heart failure. 12-26-2021 upon evaluation patient's wounds are actually showing signs of excellent improvement. I am very pleased with where things stand. Overall I think that she has been making excellent progress and I think we are very close to complete resolution which is  great news. 01-11-2022 upon evaluation today patient appears to be doing well currently in regard to her legs in general although on the left leg she does have 1 area that still irritated and inflamed on the anterior portion of her leg. Fortunately I do not see any signs of systemic infection locally there might be some infection going on here however which is my biggest concern. Fortunately I do not see any evidence though of this spreading to any other location. 01-16-2022 upon evaluation today patient has actually showing signs of excellent improvement. Fortunately I do not see any evidence of infection locally or systemically which is great news and overall I am very pleased with where we stand at this point. 01-25-2022 upon evaluation today patient actually appears to be doing decently well in regard to her legs. She has been tolerating the dressing changes without complication. Fortunately there does not appear to be any evidence of active infection locally or systemically which is great news and overall I am extremely pleased with where we stand at this point. 02-13-2022 upon evaluation today patient appears to be doing well currently in regard to her wound which is actually showing signs of significant improvement. Fortunately I do not see any evidence of active infection locally or systemically at this time. No fevers, chills, nausea, vomiting, or diarrhea. 02-20-2022 upon evaluation today patient appears to be doing well currently in regard to her wound. She has been tolerating the dressing changes without complication. Fortunately there does not appear to be any signs of active infection locally or systemically at this time. No fevers, chills, nausea, vomiting, or diarrhea. 02-27-2022 upon evaluation today patient appears to be doing excellent in regard to her wounds on the leg. She has been tolerating the dressing changes without complication this is very close to complete resolution.  Fortunately I do not see any signs of infection locally or systemically at this time which is great news. No fevers, chills, nausea, vomiting, or diarrhea. 03-06-2022 upon evaluation today patient appears to be doing well currently in regard to her wound in general. She has been tolerating the dressing changes without complication. Fortunately I do not see any evidence of active infection locally or systemically at this time which is great news. I am concerned a little bit however about not really her wounds but the second toe right foot where there appears to be some cellulitis after she dropped a frying pan on this about 2 weeks ago. She tells me its been itching and burning and I do think that it is possible she may have an infection based on what we are seeing currently. 03-20-2022 upon evaluation today patient actually appears to be doing much better at this point. Fortunately there does not appear to be any signs of active infection locally  or systemically at this time which is great news. No fevers, chills, nausea, vomiting, or diarrhea. 03-27-2022 upon evaluation today patient appears to be doing somewhat poorly in regard to her legs compared to previous. Her right leg has a couple areas that are open and she is very warm and painful to touch around the lateral portion of the ankle area. Left leg is showing signs of little bit more drainage and weeping as well I think that this is probably due to the fact that she is swelling a lot more that she has been in the past. Fortunately I do not see any signs of active infection systemically though locally there is definitely some issues here. 04-03-2022 upon evaluation today patient appears to be doing poorly in regard to her wounds on the legs unfortunately. She also seems to have more significant cellulitis that has ensued. Unfortunately she has been on doxycycline initially it seemed to be helping but as of this week that is no longer the case. It  started getting worse last week and with the reinitiation of the doxycycline nothing has improved. Often when she gets like this IV antibiotics are necessary. 04-19-2022 upon evaluation today patient appears to be doing well currently in regard to her legs from an infection standpoint. She did get IV vancomycin in the hospital and states that her legs did get better when she left she was no longer weeping but she had been in the bed for 4 days straight. With that being said since coming home her legs have begun to weep again the original wound that we were dealing with has healed she does have some weeping over both lower extremities however which is still of concern. [name], [name] ([id number]) Electronic Signature(s) Signed: 04/19/2022 4:52:21 PM By: Worthy Keeler PA-[name] Entered By: Worthy Keeler on 04/19/2022 16:52:21 Scalera, [name] ([id number]) -------------------------------------------------------------------------------- Physical Exam Details Patient Name: EPPIE, BARHORST. Date of Service: 04/19/2022 12:45 PM Medical Record Number: [id number] Patient Account Number: [id number] Date of Birth/Sex: 09-Mar-1956 (66 y.o. F) Treating RN: Carlene Coria Primary Care Provider: Ranae Plumber Other Clinician: Massie Kluver Referring Provider: Ranae Plumber Treating Provider/Extender: Skipper Cliche in Treatment: 37 Constitutional Obese and well-hydrated in no acute distress. Respiratory normal breathing without difficulty. Psychiatric this patient is able to make decisions and demonstrates good insight into disease process. Alert and Oriented x 3. pleasant and cooperative. Notes Upon inspection patient really has no open wounds. She does however have weeping over the bilateral lower extremities unfortunately. Again this is due to the significant bilateral lower extremity lymphedema. Electronic Signature(s) Signed: 04/19/2022 4:53:30 PM By: Worthy Keeler PA-[name] Entered By:  Worthy Keeler on 04/19/2022 16:53:30 [name], [name] ([id number]) -------------------------------------------------------------------------------- Physician Orders Details Patient Name: [name] Date of Service: 04/19/2022 12:45 PM Medical Record Number: [id number] Patient Account Number: [id number] Date of Birth/Sex: 1956-05-04 (66 y.o. 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 o Nurse Visit as needed Bathing/ Shower/ Hygiene o Clean wound with Normal Saline or wound cleanser. o Wash wounds with  antibacterial soap and water. o May shower; gently cleanse wound with antibacterial soap, rinse and pat dry prior to dressing wounds o No tub bath. Edema Control - Lymphedema / Segmental Compressive Device / Other o Ace wraps o Elevate, Exercise Daily and Avoid Standing for Long Periods of Time. o Elevate leg(s) parallel to the floor when sitting. o DO YOUR BEST to sleep in the bed at night. DO NOT sleep in your recliner. Long hours of sitting in a recliner leads to swelling of the legs and/or potential wounds on your backside. o Other: - use lymphedema pumps Non-Wound Condition Bilateral Lower Extremities o Cleanse affected area with antibacterial soap and water, o Apply appropriate compression. - use abdominal pads, and chuck pads, them ace wrap to secure o Additional non-wound orders/instructions: - silver cell to open weeping  areas Medications-Please add to medication list. o P.O. Antibiotics - continue Keflex as directed Patient Medications Allergies: ibuprofen, ACE Inhibitors, vancomycin Notifications Medication Indication Start End cephalexin 04/19/2022 DOSE 1 - oral 500 mg capsule - 1 capsule oral taken 2 times per day for 30 days Electronic Signature(s) Signed: 04/19/2022 4:54:45 PM By: Worthy Keeler PA-[name] Entered By: Worthy Keeler on 04/19/2022 16:54:45 [name], [name] ([id number]) -------------------------------------------------------------------------------- Problem List Details Patient Name: [name]. Date of Service: 04/19/2022 12:45 PM Medical Record Number: [id number] Patient Account Number: [id number] Date of Birth/Sex: May 01, 1956 (66 y.o. F) Treating RN: Carlene Coria Primary Care Provider: Ranae Plumber Other Clinician: Massie Kluver Referring Provider: Ranae Plumber Treating Provider/Extender: Skipper Cliche in Treatment: 37 Active Problems ICD-10 Encounter Code Description Active Date MDM Diagnosis Q82.0 Hereditary lymphedema 08/01/2021 No Yes L97.811 Non-pressure chronic ulcer of other part of right lower leg limited to 08/01/2021 No Yes breakdown of skin L97.821 Non-pressure chronic ulcer of other part of left lower leg limited to 08/01/2021 No Yes breakdown of skin Inactive Problems Resolved Problems Electronic Signature(s) Signed: 04/19/2022 1:00:16 PM By: Worthy Keeler PA-[name] Entered By: Worthy Keeler on 04/19/2022 13:00:16 [name], [name] ([id number]) -------------------------------------------------------------------------------- Progress Note Details Patient Name: [name]. Date of Service: 04/19/2022 12:45 PM Medical Record Number: [id number] Patient Account Number: [id number] Date of Birth/Sex: 11/23/1955 (66 y.o. F) Treating RN: Carlene Coria Primary Care Provider: Ranae Plumber Other Clinician: Massie Kluver Referring Provider:  Ranae Plumber Treating Provider/Extender: Skipper Cliche in Treatment: 37 Subjective Chief Complaint Information obtained from Patient Bilateral LE lymphedema with Left LE Ulcers History of Present Illness (HPI) The patient is a 66 year old female with history of hypertension and a long-standing history of bilateral lower extremity lymphedema (first presented on 4/2) . She has had open ulcers in the past which have always responded to compression therapy. She had briefly been to a lymphedema clinic in the past which helped her at the time. this time around she stopped treatment of her lymphedema pumps approximately 2 weeks ago because of some pain in the knees and then noticed the right leg getting worse. She was seen by her PCP who put her on clindamycin 4 times a day 2 days ago. The patient has seen [facility] and Dr. Delana Meyer had seen her last year where a vascular study including venous and arterial duplex studies were within normal limits. he had recommended compression stockings and lymphedema pumps and the patient has been using this in about 2 weeks ago. She is known to be diabetic but in the past few time she's gone to her primary care doctor her hemoglobin A1c has  been normal. 02/11/2015 - after her last visit she took my advice and went to the ER regarding the progressive cellulitis of her right lower extremity and she was admitted between July 17 and 22nd. She received IV antibiotics and then was sent home on a course of steroid-induced and oral antibiotics. She has improved much since then. 02/17/2015 -- she has been doing fine and the weeping of her legs has remarkably gone down. She has no fresh issues. READMISSION 01/15/18 This patient was given this clinic before most recently in 2016 seen by Dr. Con Memos. She has massive bilateral lymphedema and over the last 2 months this had weeping edema out of the left leg. She has compression pumps but her compliance with these has been  minimal. She has [facility] they've been using TCA/ABDs/kerlix under an Ace wrap.she has had recent problems with cellulitis. She was apparently seen in the ER and 12/23/17 and given clindamycin. She was then followed by her primary doctor and given doxycycline and Keflex. The pain seems to have settled down. In April 2018 the patient had arterial studies done at [facility]. This showed triphasic waveforms throughout the right leg and mostly triphasic waveforms on the left except for monophasic at the posterior tibial artery distally. She was not felt to have evidence of right lower extremity arterial stenosis or significant problems on the left side. 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 She also had a right lower extremity venous Doppler in January 2018 this was limited by the patient's body habitus and lymphedema. Most of the proximal veins were not visualized The patient presents with an area of denuded skin on the anterior medial part of the left calf. There is weeping edema fluid here. 01/22/18; the patient has somewhat better edema control using her compression pumps twice a day and as a result she has much better epithelialization on the left anterior calf area. Only a small open area remains. 01/29/18; the patient has been compliant with her compression pumps. Both the areas on her calf that healed. The remaining area on the left anterior leg is fully epithelialized Readmission: 02/20/2019 upon evaluation today patient presents for reevaluation due to issues that she is having with the bilateral lower extremities. She actually has wounds open on both legs. On the right she has an area in the crease of her leg on the right around the knee region which is actually draining quite a bit and actually has some fungal type appearance to it. She has been on nystatin powder that seems to have helped to some degree. In regard to the left lower  extremity this is actually in the lower portion of her leg closer to the ankle and again is continuing to drain as well unfortunately. There does not appear to be any signs of active infection at this time which is good news. No fevers, chills, nausea, vomiting, or diarrhea. She tells me that since she was seen last year she is actually been doing quite well for the most part with regard to her lower extremities. Unfortunately she now is experiencing a little bit more drainage at this time. She is concerned about getting this under control so that it does not get significantly worse. 02/27/2019 on evaluation today patient appears to be doing somewhat better in regard to her bilateral lower extremity wounds. She has been tolerating the dressing changes without complication. Fortunately there is no signs of active infection at this point. No fevers,  chills, nausea, vomiting, or diarrhea. She did get her dressing supplies which is excellent news she was extremely excited to get these. She also got paperwork from [employer] for their financial assistance program where they may be able to help her out in the future if needed with supplies at discounted prices. 03/06/2019 on evaluation today patient appears to be doing a little worse with regard to both areas of weeping on her bilateral lower extremities. This is around the right medial knee and just above the left ankle. With that being said she is unfortunately not doing as well as I would like to see. I feel like she may need to potentially go see someone at the lymphedema clinic as the wraps that she needs or even beyond what we can do here at the wound care center. She really does not have wounds she just has open areas of weeping that are causing some difficulty for her. Subsequently because of this and the moisture I am concerned about the potential for infection I am going to likely give her a prophylactic antibiotic today, Keflex, just to be on the safe  side. Nonetheless again there is no obvious signs of active infection at this time. ERNA, BROSSARD ([id number]) 03/13/2019 on evaluation today patient appears to be doing well with regard to her bilateral lower extremities where she has been weeping compared to even last week's evaluation. I see some areas of new skin growth which is excellent and overall I am very pleased with how things seem to be progressing. No fevers, chills, nausea, vomiting, or diarrhea. 03/20/2019 on evaluation today patient unfortunately is continuing to have issues with significant edema of the left lower extremity. Her right side seems to be doing much better. Unfortunately her left side is showing increased weeping of the lower portion of her leg. This is quite unfortunate obviously we were hoping to get her into the lymphedema clinic they really do not seem to when I see her how if she is draining. Despite the fact this is really not wound related but more lymphedema weeping related. Nonetheless I do not know that this can be helpful for her to even go for that appointment since again I am not sure there is much that they would actually do at this point. We may need to try a 4 layer compression wrap as best we can on her leg. She is on the Augmentin currently although I am still concerned about whether or not there could be potentially something going on infection wise I would obtain a culture though I understand is not the best being that is a surface culture I just 1 to make sure I do not seem to be missing anything. 03/27/2019 on evaluation today patient appears to be doing much better in regard to the left lower extremity compared to last week. Last week she had tremendous weeping which I think was subsequent to infection now she seems to be doing much better and very pleased. This is not completely healed but there is a lot of new skin growth and it has dried out quite a bit. Overall I think that we are doing well with  how things are moving along at this time. No fevers, chills, nausea, vomiting, or diarrhea. 04/03/2019 evaluation today patient appears to be doing a little worse this week compared to last time I saw her. I think this may be due to the fact that she is having issues with not being able to sleep in  her bed at least not until last night. She is therefore been in a lift chair and subsequently has also had issues with not been able to use her pumps since she could not get in bed. With that being said the patient overall seems to be doing okay I do think I may want extend the antibiotic for a little bit longer at least until we can see if her edema and her weeping gets better and if it is then obviously I can always discontinue the antibiotics as of next week however I want her to continue to have it over the next week. 04/10/2019 on evaluation today patient unfortunately is still doing poorly with regard to her left lower extremity. Her right is all things considering doing fairly well. On the left however she continues to have spreading of the area of infection and weeping which appears to be even a larger surface area than noted last week. She did have a positive culture for Pseudomonas in particular which seems to have been of concern she still has green/yellow discharge consistent with Pseudomonas and subsequently a tremendous amount of it. This has me obviously still concerned about the infection not really clearing up despite the fact that on culture it appears the Cipro should have been a good option for treating this. I think she may at this point need IV antibiotics since things are not doing better I do not want to get worse and cause sepsis. She is in agreement with the plan and believes as well that she likely does need to go to the hospital for IV vancomycin. Or something of the like depending on what the recommendation is from the ER. 04/17/2019 on evaluation today patient appears to be doing  excellent in regard to her lower extremity on the left. She was in the hospital for several days from when I sent her last we saw her until just this past Tuesday. Fortunately her drainage is significantly improved and in fact is mostly clear. There is just a couple small areas that may still drain a little bit she states that the [facility] they prescribed for her at discharge she went picked up from pharmacy and got home but has not been able to find it since. She is looked everywhere. She is wondering if I will replace that for her today I will be more than happy to do that. 05/01/2019 on evaluation today patient actually appears to be doing quite well with regard to her lower extremities. She occasionally is having areas that will leak and then heal up mainly when a piece of the fibrotic skin pops off but fortunately she is not having any signs of active infection at this time. Overall she also really does not have any obvious weeping at this time. I do believe however she really needs some compression wraps and I think this may be a good time to get her back to the lymphedema clinic. 05/11/2019 on evaluation today patient actually appears to be doing quite well with regard to her bilateral lower extremities. She occasionally will have a small area that we per another but in general seems to be completely healed which is great news. Overall very pleased with how everything seems to be progressing. She does have her appointment with lymphedema clinic on November 18. 05/25/2019 on evaluation today patient appears to be doing well with regard to her left lower extremity. I am very pleased in this regard. In regard to her right leg this actually did  start draining more I think it is mainly due to the fact that her leg is more swollen. I am not seeing any obvious signs of infection at this time although that is definitely something were obviously acutely aware of simply due to the fact that she had  an issue not too far back with exactly this issue. Nonetheless I do feel like that lymphedema clinic would still be beneficial for her. I explained obviously if they are not able to do anything treatment wise on the right leg we could at least have them treat her left leg and then proceed from there. The patient is really in agreement with that plan. If they are able to do both as the drainage slows down that I would be happy to let them handle both. 06/01/2019 on evaluation today patient unfortunately appears to be doing worse with regard to her right lower extremity. The left lower extremity is still maintaining at this point. Unfortunately she has been having significantly increased pain over the past several days and has been experiencing as well increased swelling of the right lower extremity. I really do not know that I am seeing anything that appears to be obvious for infection at this point to be peripherally honest. With that being said the patient does seem to be having much more swelling that she is even experienced in the past and coupled with increased pain in her hip as well I am concerned that again she could potentially have a DVT although I am not 100% sure of this. I think it something that may need to be checked out. We discussed the possibility of sending her for a DVT study through the hospital but unfortunately transportation is an issue if she does have a DVT I do not want her to wait days to be able to get in for that test however if she has this scheduled as an outpatient that is as fast that she will be able to get the test scheduled for transportation purposes. That will also fall on Thanksgiving so subsequently she did actually be looking at either Friday or even next week before we would know anything back from this. That is much too long in my opinion. Subsequent to the amount of discomfort she is experiencing the patient is actually okay with going to the ER for  evaluation today. 06/12/2019 on evaluation today patient actually appears to be doing significantly better compared to last time I saw her. Following when I last saw her she was actually in the hospital from that Monday until the following Sunday almost 1 full week. She actually was placed on Keflex in the hospital following the time for her to be discharged and Dr. Steva Ready has recommended 2 times a day dosing of the Keflex for the next year in order to help with more prophylactic/preventative measures with regard to her developing cellulitis. Overall I think this sounds like an excellent plan. The patient unfortunately is good to have trouble being treated at lymphedema clinic due to the fact that she really cannot get up on the bed that they have there. They also state that they cannot manage her as long as she has anything draining at this point. Obviously that is somewhat unfortunate as she does need help with edema control but nonetheless we will have to do what we can for her outside of it sounds like the lymphedema clinic scenario at this point. 06/19/2019 on evaluation today patient appears to be doing fairly well with  regard to her bilateral lower extremities. She is not nearly as swollen [name], [name]. ([id number]) and shows no signs of infection at this point. There is no evidence of cellulitis whatsoever. She also has no open wounds or draining at this point which is also good news. No fever chills noted. She seems to be in very good spirits and in fact appears to be doing quite well. READMISSION 11/27/2019 This is a 66 year old woman that we have had in this clinic several times before including 2015, 16 and 19 and then most recently from 03/20/2019 through 06/19/2019 with bilateral lower extremity lymphedema. She has had previous arterial and reflux studies done years ago which were not all that remarkable. 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 She does have a positive family history and she had large legs starting may be in her 86s. She was recently in hospital from 10/20/2019 through 10/28/2019 with right leg cellulitis. She was given Ancef and clindamycin and then Zosyn when a culture showed Pseudomonas. At that time there was purulent drainage. She was followed by infectious disease Dr Steva Ready. The patient is now back at home. She has noted increased swelling in the right and no drainage in her right leg mostly on the posterior medial aspect in the calf area. She has not had pain or fever. She has literally been improved lysing above dressings because her at the area of this is far too large for standard compression. She has been wrapping the areas with sheets to resorptive pads. She is found these helped somewhat. She does have an appointment with the lymphedema clinic in [place] in late June. Past medical history includes bilateral lymphedema, hypertension, obstructive sleep apnea with CPAP. Recent hospitalization with apparently Pseudomonas cellulitis of the right lower leg 12/15/2019 upon evaluation today patient appears to be doing a little bit worse in regard to her right lower extremity. Unfortunately she is having more weeping down in the lower portion of her leg. Fortunately there is no signs of active infection at this time. No fever chills noted. The patient states she is not having increased pain except for when she attempted to use the lymphedema pumps unfortunately she states that she did have pain when she did this. Otherwise we been using absorptive dressings of one type or another she is using diapers at home and then subsequently Ace wraps. In regard to the barrier cream we have discussed the possibility of derma cloud which she would like to try I do not have a problem with that. 12/22/2019 upon evaluation today patient actually appears to be doing better in regard to her leg ulcers at this  point. Fortunately there does not appear to be any signs of active infection which is great news and I am extremely pleased with where things are progressing at this time. There is no sign of active infection currently. The patient is very pleased to see things doing so well. 12/29/2019 upon evaluation today patient appears to be doing a little bit better in regard to her weeping in general over her lower extremities. She does have some signs of mild erythema little bit more than what I noted last week or rather last visit. Nonetheless I think that my threshold for switching her antibiotics from Keflex to something else is very low at this point considering that she has had such severe infections in the past that seem to come almost out of nowhere. There is a  little erythema and warmth noted of the lower portion of her leg compared to the upper which also makes me want to go ahead and address things more rapidly at this point. Likely I would switch out the Keflex for something like Levaquin ideally. 7/16; patient with severe bilateral lymphedema. She has superficial wounds albeit almost circumferential now on the left lateral lower leg. This may be new from last time. Small area on the right anterior lower leg and then another area on the right medial lower leg and of pannus fold. She has been using various absorptive garments. She states she is using her compression pumps once a day occasionally twice. Culture from her last visit here was negative 01/29/2020 on evaluation today patient appears to be doing excellent at this point in regard to her legs with regard to infection I see no signs of active infection at this point. She still does have unfortunately areas of weeping this is minimal on the right now her left is actually significantly worse although I do not think it is as bad as last week with Dr. Dellia Nims saw her. She has been trying to pump and elevate her legs is much as possible. She has  previously been on the Keflex and in the past for prevention that seems to do fairly well and likely can extend that today. 02/04/2020 on evaluation today patient appears to be doing better in regard to her legs bilaterally. Fortunately there is no signs of active infection at this time which is great news and overall she has less weeping on the left compared to the right and there is several spots where she is pretty much sealed up with no draining regions. Overall very pleased in this regard. 02/19/2020 on evaluation today patient appears to be doing very well in regard to her wounds currently. Fortunately there is no evidence of active infection overall very pleased with where things stand. She is significantly improved in regard to her edema I am extremely pleased in this regard she tells me that the popping no longer hurts and in fact she actually looks forward to it. 03/04/2020 on evaluation today patient appears to be doing excellent in regard to her lower extremities. Fortunately there is no signs of active infection at this time. No fevers, chills, nausea, vomiting, or diarrhea. 03/25/2020 on evaluation today patient appears to be doing a little bit more poorly in regard to her legs at this point. She tells me that she is still continue to have issues with drainage and this has been a little bit worse she was getting ready to start taking the Keflex again but wanted to see me first. Fortunately there is no signs of active infection at this time. No fevers, chills, nausea, vomiting, or diarrhea. 04/15/2020 upon evaluation today patient appears to be doing somewhat poorly in regard to her right leg. She tells me she has been having more pain she has been taking the Keflex that was previously prescribed unfortunately that just does not seem to help with this. She was hoping that the pain on her right was actually coming from the fact that she was having issues with her wrap having gotten caught in her  recliner. With that being said she tells me that she knew something was not right. Currently her right leg is warm to touch along with being erythematous all the way up to around at least mid thigh as far as I can see. The left leg does not appear to be  doing that badly though there is increased weeping around the ankle region. 05/06/2020 on evaluation today patient appears to be doing much better than last time I saw her. She did go to the hospital where she was admitted for 2 days and treated with antibiotic therapy. She was discharged with antibiotics as well and has done extremely well. I am extremely pleased with where things stand today. There is no signs of active infection at this time which is great news. 05/27/2020 upon evaluation today patient appears to be doing well with regard to her lower extremities bilaterally. She has just a very tiny area on the right leg which is opening on the left leg she is significantly improved though she still has several areas that do appear to be open this is minimal compared to what is been in the past. In general I am extremely pleased with where things stand today. The patient does tell me she is not been using her pumps quite as much as she should be. I do believe that is 1 area she can definitely work on. She has had a lot going on including a Covid exposure and apparently also a outbreak of likely shingles. [name], [name] ([id number]) 06/24/2020 upon evaluation today patient appears to be doing well with regard to her legs in general although the left leg unfortunately is showing some signs of erythema she does have a little bit of increased weeping and to be honest I am concerned about infection here. I discussed that with her today and I think that we may need to address this sooner rather than later she has been taking Keflex she is not really certain that is been making a big improvement however. No fevers, chills, nausea, vomiting, or  diarrhea. 06/30/2020 upon evaluation today patient's legs actually seem to be doing better in my opinion as compared to where they were last week. Fortunately there does not appear to be any signs of active infection. Her culture showed multiple organisms nothing predominate. With that being said the Levaquin seems to have done well I think she has improved since I last saw her as well. 07/14/2020 upon evaluation today patient appears to be doing actually better in regard to her lower extremities in my opinion. She has been tolerating the dressing changes without complication. Fortunately there is no signs of active infection at this time. 08/04/2020 upon evaluation today patient appears to be doing excellent in regard to her leg ulcers. Fortunately she has very little that is open at this point. This is great news. In fact I think that the Goldbond medicated powder has been excellent for her. It seems to have done the trick where we had tried several other things without as much success. Fortunately there is no evidence of active infection at this time. No fevers, chills, nausea, vomiting, or diarrhea. 09/01/2020 upon evaluation today patient appears to be doing a little bit more poorly than the last time I saw her. She tells me right now that she has been having a lot of drainage compared to where things were previous which has unfortunately led to more irritation as well. She is concerned that this is leading to infection. Again based on what I am seeing today as well I am also concerned of the same to be honest. 09/15/2020 upon evaluation today patient appears to be doing well with regard to her legs compared to what I saw previous. Fortunately there does not appear to be any evidence of active infection  at this time which is great news. At least not as badly as what it was previous. With that being said I do believe that the patient does need to have an extension of the antibiotics. This I believe  will actually help her more in the way of making sure this stays under control and does not worsen. That was the Bactrim DS. Readmission: 08/01/2021 upon evaluation today patient appears to be doing actually pretty well all things considered. Is actually been almost a year since have seen her last March. Fortunately I do not see any evidence of active infection locally nor systemically at this point. She does have a couple open areas on the left leg the right leg appears to be doing quite well. In general she has been in the hospital 3 times since I saw her a year ago all 4 cellulitis type issues except for the last 1 which was actually more related to congestive heart failure for that reason she is not using her lymphedema pumps at this point and that is probably the best idea. 08/08/2021 upon evaluation today patient appears to be doing well with regard to her legs all things considered her left leg may be a little bit more swollen based on what I see today. Fortunately I do not see any signs of active infection locally nor systemically at this time which is great news. No fevers, chills, nausea, vomiting, or diarrhea. 08/15/2021 upon evaluation today patient actually appears to be doing excellent in regard to her wounds. She has been tolerating the dressing changes without complication. Fortunately I see no evidence of infection currently which is great news. No fevers, chills, nausea, vomiting, or diarrhea. 08/29/2021 upon evaluation patient appears to be doing excellent she in fact is almost completely healed. I am actually very pleased with where we stand today and I think that she is making wonderful progress she just has a small area of weeping on the right lateral leg everything else is pretty much completely healed which is also. 09/05/2021 upon evaluation today patient appears to be doing well with regard to her legs. She does have a small area of weeping on the right leg and there is a small  area on the left leg as well. It is potentially something that may need to be addressed in the future more specifically but right now I think that there may have just been a little drainage here on the left. With all that being said I think that this still does not appear to be infected and overall I feel like she is doing quite well. That something we always have to keep a very close eye on as she can change very rapidly from okay to not okay. 09/12/2021 upon evaluation today patient appears to be doing a little bit worse in regard to her leg and actually somewhat concerned about the possibility of infection here. I discussed that with the patient. For that reason I am going to go ahead and see about getting her set up for a repeat prescription for the Bactrim. She is in agreement with that plan. 3/14; patient presents for follow-up. She has been using silver alginate with dressing changes. She is still taking Bactrim prescribed at last clinic visit. She has no issues or complaints today. She has lymphedema pumps however does not use these. 09/26/2020 upon evaluation today patient appears to actually be doing well in regard to her right leg I am pleased in that regard. Unfortunately she has new  areas on her left leg that are open at this point that I do need to be addressed. I am also concerned about the warmth around one of the wounds on the more anterior side. I think this is something that we may need to culture today potentially requiring antibiotics going forward. 10/03/2021 upon evaluation today patient unfortunately is not doing nearly as well as she was even last week with her wounds. She is having some issues here with increased cellulitis of the left lower extremity unfortunately. I did prescribe Augmentin for her eczema prescribed last week unfortunately she never actually received this prescription. The pharmacy stated they never got it. We double checked with them today they still did not  receive it. I am not sure what happened in that regard. With that being said the patient is in good spirits today she does not appear to be as sick as where she normally is when the cellulitis gets significantly worse as it has in the past. Nonetheless the leg is much more red not just around the ankle where I was seeing at last week on Tuesday but rather now this is going all the way up to almost her knee and is definitely hot to touch compared to the right leg. Nonetheless I feel like she does need to go to the ER for likely IV antibiotics. 10-10-2021 upon evaluation today patient appears to be doing well with regard to her wound. She has been tolerating the dressing changes that appears to be doing much better. After I saw her last week she did go to the ER they admitted her to the hospital and gave her IV antibiotics she fortunately is doing significantly better. Overall I am extremely pleased with where things stand today. 10-17-2021 upon evaluation today patient appears to be doing well with regard to her wound. In fact this is looking better and her leg is looking better but she still does have some areas here of erythema. We will continue to address this as soon as possible in my opinion. I do believe she may require some debridement and what appears to be a more defined wound on her left leg at this point. 10-24-2021 upon evaluation patient is definitely showing signs of improvement which is great news. I do not see any evidence of active infection [place], [place]. ([id number]) locally or systemically which is great news as well. No fevers, chills, nausea, vomiting, or diarrhea. 10-31-2021. Upon evaluation today patient's leg actually feels better from an infection and wound care perspective. I am actually very pleased in this regard. Unfortunately the biggest issue that I see at this time is that the patient is having a significant issue with her breathing. I did put her on the pulse ox machine  to check her oxygen saturation and it was pretty much at 100% which is good but she tells me that when she is walking this is much more difficult for her. She also tells me that when she is trying to bend over to perform her dressing changes she is so short of breath due to the swelling and fluid in her abdominal area that she is just not been able to do this. She is tearful and very concerned today to be honest. I am truly sorry to see her this way I know she is really having a hard time at the moment. 11-14-2021 upon evaluation patient actually appears to be doing significantly better compared to when I last saw her. She was actually admitted  to the hospital I believe this for 6 days total after I sent her on 10-31-2021. Subsequently they actually pulled off greater than 50 pounds of fluid she tells me she feels significantly better her legs are not as tight she actually has some play in the tissue and it is obvious that she is doing much better just from a mobility standpoint as well as a breathing standpoint. Overall I am extremely happy for her and how she is doing the leg also looks much better on the left. 11-21-2021 upon evaluation today patient appears to be doing well although it does appear that she is going require some sharp debridement the wound is very dry this is the opposite of what its been she was having so much weeping that it was staying extremely wet. Nonetheless we do need to see about going ahead and debriding the wound today. 11-28-2021 upon evaluation today patient appears to be doing well with regard to her wound I definitely see signs of things continuing to improve which is great news. I do not see any evidence of active infection locally or systemically also great news. 12-05-2021 upon evaluation today patient appears to be doing well with regard to her wound. She has been tolerating the dressing changes. Fortunately there does not appear to be any signs of active infection  locally or systemically at this time. No fevers, chills, nausea, vomiting, or diarrhea. 12-12-2021 upon evaluation patient appears to be doing well with regard to her wound this is actually measuring smaller and looking much better. Fortunately I do not see any signs of active infection locally or systemically at this time which is excellent news. 6/13; small wound area in the folds of her lymphedema just above the left ankle. Fortunately the area looks quite good. She has been using silver alginate ABDs and an Ace wrap which she is able to change her self. She is not using her compression pumps out of fear that this could contribute to heart failure. 12-26-2021 upon evaluation patient's wounds are actually showing signs of excellent improvement. I am very pleased with where things stand. Overall I think that she has been making excellent progress and I think we are very close to complete resolution which is great news. 01-11-2022 upon evaluation today patient appears to be doing well currently in regard to her legs in general although on the left leg she does have 1 area that still irritated and inflamed on the anterior portion of her leg. Fortunately I do not see any signs of systemic infection locally there might be some infection going on here however which is my biggest concern. Fortunately I do not see any evidence though of this spreading to any other location. 01-16-2022 upon evaluation today patient has actually showing signs of excellent improvement. Fortunately I do not see any evidence of infection locally or systemically which is great news and overall I am very pleased with where we stand at this point. 01-25-2022 upon evaluation today patient actually appears to be doing decently well in regard to her legs. She has been tolerating the dressing changes without complication. Fortunately there does not appear to be any evidence of active infection locally or systemically which is great news and  overall I am extremely pleased with where we stand at this point. 02-13-2022 upon evaluation today patient appears to be doing well currently in regard to her wound which is actually showing signs of significant improvement. Fortunately I do not see any evidence of active infection  locally or systemically at this time. No fevers, chills, nausea, vomiting, or diarrhea. 02-20-2022 upon evaluation today patient appears to be doing well currently in regard to her wound. She has been tolerating the dressing changes without complication. Fortunately there does not appear to be any signs of active infection locally or systemically at this time. No fevers, chills, nausea, vomiting, or diarrhea. 02-27-2022 upon evaluation today patient appears to be doing excellent in regard to her wounds on the leg. She has been tolerating the dressing changes without complication this is very close to complete resolution. Fortunately I do not see any signs of infection locally or systemically at this time which is great news. No fevers, chills, nausea, vomiting, or diarrhea. 03-06-2022 upon evaluation today patient appears to be doing well currently in regard to her wound in general. She has been tolerating the dressing changes without complication. Fortunately I do not see any evidence of active infection locally or systemically at this time which is great news. I am concerned a little bit however about not really her wounds but the second toe right foot where there appears to be some cellulitis after she dropped a frying pan on this about 2 weeks ago. She tells me its been itching and burning and I do think that it is possible she may have an infection based on what we are seeing currently. 03-20-2022 upon evaluation today patient actually appears to be doing much better at this point. Fortunately there does not appear to be any signs of active infection locally or systemically at this time which is great news. No fevers,  chills, nausea, vomiting, or diarrhea. 03-27-2022 upon evaluation today patient appears to be doing somewhat poorly in regard to her legs compared to previous. Her right leg has a couple areas that are open and she is very warm and painful to touch around the lateral portion of the ankle area. Left leg is showing signs of little bit more drainage and weeping as well I think that this is probably due to the fact that she is swelling a lot more that she has been in the past. Fortunately I do not see any signs of active infection systemically though locally there is definitely some issues here. 04-03-2022 upon evaluation today patient appears to be doing poorly in regard to her wounds on the legs unfortunately. She also seems to have more significant cellulitis that has ensued. Unfortunately she has been on doxycycline initially it seemed to be helping but as of this week that is no longer the case. It started getting worse last week and with the reinitiation of the doxycycline nothing has improved. Often when she gets like this IV antibiotics are necessary. [name], [name] ([id number]) 04-19-2022 upon evaluation today patient appears to be doing well currently in regard to her legs from an infection standpoint. She did get IV vancomycin in the hospital and states that her legs did get better when she left she was no longer weeping but she had been in the bed for 4 days straight. With that being said since coming home her legs have begun to weep again the original wound that we were dealing with has healed she does have some weeping over both lower extremities however which is still of concern. Objective Constitutional Obese and well-hydrated in no acute distress. Vitals Time Taken: 12:57 PM, Height: 63 in, Weight: 372 lbs, BMI: 65.9, Temperature: 98.0 F, Pulse: 88 bpm, Respiratory Rate: 18 breaths/min, Blood Pressure: 119/80 mmHg. Respiratory normal breathing without  difficulty. Psychiatric this  patient is able to make decisions and demonstrates good insight into disease process. Alert and Oriented x 3. pleasant and cooperative. General Notes: Upon inspection patient really has no open wounds. She does however have weeping over the bilateral lower extremities unfortunately. Again this is due to the significant bilateral lower extremity lymphedema. Integumentary (Hair, Skin) Wound #10 status is Healed - Epithelialized. Original cause of wound was Gradually Appeared. The date acquired was: 10/17/2021. The wound has been in treatment 26 weeks. The wound is located on the Left,Medial,Anterior Lower Leg. The wound measures 0cm length x 0cm width x 0cm depth; 0cm^2 area and 0cm^3 volume. There is Fat Layer (Subcutaneous Tissue) exposed. There is a medium amount of serosanguineous drainage noted. The wound margin is indistinct and nonvisible. There is medium (34-66%) red granulation within the wound bed. There is a small (1-33%) amount of necrotic tissue within the wound bed. Other Condition(s) Patient presents with Lymphedema located on the Right Leg. General Notes: increased weeping to right leg today Patient presents with Lymphedema located on the Left Leg. 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 Nurse Visit as needed Bathing/ Shower/ Hygiene: Clean wound with Normal Saline or wound cleanser. Wash wounds with antibacterial soap and water. May shower; gently cleanse wound with antibacterial soap, rinse and pat dry prior to dressing wounds No tub bath. Edema Control - Lymphedema / Segmental Compressive Device / Other: Ace wraps Nabozny, Dafney [name]. ([id number]) Elevate, Exercise Daily and Avoid  Standing for Long Periods of Time. Elevate leg(s) parallel to the floor when sitting. DO YOUR BEST to sleep in the bed at night. DO NOT sleep in your recliner. Long hours of sitting in a recliner leads to swelling of the legs and/or potential wounds on your backside. Other: - use lymphedema pumps Non-Wound Condition: Cleanse affected area with antibacterial soap and water, Apply appropriate compression. - use abdominal pads, and chuck pads, them ace wrap to secure Additional non-wound orders/instructions: - silver cell to open weeping areas Medications-Please add to medication list.: P.O. Antibiotics - continue Keflex as directed The following medication(s) was prescribed: cephalexin oral 500 mg capsule 1 1 capsule oral taken 2 times per day for 30 days starting 04/19/2022 1. Based on what I am seeing I am good recommend that we actually go ahead and have the patient put on long-term antibiotics to try to prevent anything from continuing to get infected frequently. She has been on this previous by infectious disease but I Georgina Peer go ahead and send in a prescription for the Keflex 500 mg I Minna have her take 1 pill twice a day and that is going to be indefinite at this point to try to prevent things from getting infected as frequently as it has in the past. 2. I would recommend as well that she needs to try to elevate her legs is much as possible to help with edema control if she can use her pump sites even better. 3. We will get a continue with alginate to any open weeping areas followed by ABD pads, Chux pads, and then subsequently Ace wrap to hold everything in place. We will see patient back for reevaluation in 1 week here in the clinic. If anything worsens or  changes patient will contact our office for additional recommendations. Electronic Signature(s) Signed: 04/19/2022 4:55:08 PM By: Worthy Keeler PA-[name] Entered By: Worthy Keeler on 04/19/2022 16:55:08 Rottman, [name]  ([id number]) -------------------------------------------------------------------------------- SuperBill Details Patient Name: [name] Date of Service: 04/19/2022 Medical Record Number: [id number] Patient Account Number: [id number] Date of Birth/Sex: September 11, 1955 (66 y.o. F) Treating RN: Carlene Coria Primary Care Provider: Ranae Plumber Other Clinician: Massie Kluver Referring Provider: Ranae Plumber Treating Provider/Extender: Skipper Cliche in Treatment: 37 Diagnosis Coding ICD-10 Codes Code Description Q82.0 Hereditary lymphedema L97.811 Non-pressure chronic ulcer of other part of right lower leg limited to breakdown of skin L97.821 Non-pressure chronic ulcer of other part of left lower leg limited to breakdown of skin Facility Procedures CPT4 Code: 44010272 Description: 99213 - WOUND CARE VISIT-LEV 3 EST PT Modifier: Quantity: 1 Physician Procedures CPT4 Code: 5366440 Description: 34742 - WC PHYS LEVEL 4 - EST PT Modifier: Quantity: 1 CPT4 Code: Description: ICD-10 Diagnosis Description Q82.0 Hereditary lymphedema L97.811 Non-pressure chronic ulcer of other part of right lower leg limited to bre L97.821 Non-pressure chronic ulcer of other part of left lower leg limited to brea Modifier: akdown of skin kdown of skin Quantity: Electronic Signature(s) Signed: 04/19/2022 4:55:26 PM By: Worthy Keeler PA-[name] Entered By: Worthy Keeler on 04/19/2022 16:55:26

## 2022-04-24 ENCOUNTER — Ambulatory Visit
Admission: RE | Admit: 2022-04-24 | Discharge: 2022-04-24 | Disposition: A | Discharge status: Home / Self Care | Payer: Medicare HMO | Source: Ambulatory Visit | Attending: Nephrology | Admitting: Nephrology

## 2022-04-24 ENCOUNTER — Encounter: Payer: Self-pay | Admitting: Family

## 2022-04-24 ENCOUNTER — Ambulatory Visit (HOSPITAL_BASED_OUTPATIENT_CLINIC_OR_DEPARTMENT_OTHER): Payer: Medicare HMO | Admitting: Family

## 2022-04-24 VITALS — BP 128/80 | HR 90 | Resp 18 | Ht 63.0 in | Wt 393.0 lb

## 2022-04-24 DIAGNOSIS — G4733 Obstructive sleep apnea (adult) (pediatric): Secondary | ICD-10-CM

## 2022-04-24 DIAGNOSIS — N179 Acute kidney failure, unspecified: Secondary | ICD-10-CM | POA: Insufficient documentation

## 2022-04-24 DIAGNOSIS — I5032 Chronic diastolic (congestive) heart failure: Secondary | ICD-10-CM

## 2022-04-24 DIAGNOSIS — N182 Chronic kidney disease, stage 2 (mild): Secondary | ICD-10-CM | POA: Insufficient documentation

## 2022-04-24 DIAGNOSIS — I10 Essential (primary) hypertension: Secondary | ICD-10-CM | POA: Insufficient documentation

## 2022-04-24 DIAGNOSIS — I890 Lymphedema, not elsewhere classified: Secondary | ICD-10-CM | POA: Insufficient documentation

## 2022-04-24 DIAGNOSIS — L03115 Cellulitis of right lower limb: Secondary | ICD-10-CM | POA: Diagnosis present

## 2022-04-24 NOTE — Patient Instructions (Signed)
If you have voicemail, please make sure your mailbox is cleaned out so that we may leave a message and please make sure to listen to any voicemails.    If you receive a satisfaction survey regarding the [facility], please take the time to fill it out. This way we can continue to provide excellent care and make any changes that need to be made.   

## 2022-04-24 NOTE — Progress Notes (Signed)
Patient ID: [name], female    DOB: 12-29-1955, 66 y.o.   MRN: [id number]  HPI  Ms Joubert is a 66 y/o female with a history of asthma, HTN, arthritis, lymphedema, sleep apnea, spinal stenosis, obesity, previous tobacco use and chronic heart failure.   Echo report from 11/01/21 reviewed and showed an EF of 60-65%.   Admitted 04/03/22 due to right lower leg cellulitis that had failed out patient therapy. Initially given IV antibiotics and then changed to oral meds. She had hypokalemia and hypomagnesemia that were repleted.   Discharged after 4 days. Admitted 10/31/21 due to worsening shortness of breath, edema and orthopnea. Initially given IV lasix with transition to oral diuretics with addition of twice weekly metolazone. Cardiology consult obtained. Discharged after 6 days.   She presents today for a follow-up visit with a chief complaint of moderate fatigue with minimal exertion. Describes this as chronic in nature. She has associated head congestion, cough, shortness of breath, chronic lymphedema, palpitations, dizziness, chronic pain, anxiety and difficulty sleeping along with this. She denies wheezing, chest pain or abdominal distention.   She says that her leg cellulitis is improving and her left leg no longer has any drainage. Currently receiving PT at home a couple of times/ week.   Had a renal ultrasound done earlier today per nephrology. Continues to take metolazone twice weekly.   Past Medical History:  Diagnosis Date   Arthritis    Asthma    CHF (congestive heart failure) ([place])    Enlarged heart    Hypertension    Lymphedema    Sleep apnea    Spinal stenosis    Past Surgical History:  Procedure Laterality Date   COLONOSCOPY WITH PROPOFOL N/A 03/12/2018   Procedure: COLONOSCOPY WITH PROPOFOL;  Surgeon: Manya Silvas, MD;  Location: [facility] ENDOSCOPY;  Service: Endoscopy;  Laterality: N/A;   NO PAST SURGERIES     Family History  Problem Relation Age of Onset    Breast cancer Maternal Aunt    Thyroid disease Other    Allergies  Allergen Reactions   Ace Inhibitors Hives and Swelling   Ibuprofen Hives and Swelling   Vancomycin Itching and Nausea And Vomiting   Prior to Admission medications   Medication Sig Start Date End Date Taking? Authorizing Provider  acetaminophen (TYLENOL) 500 MG tablet Take 500-1,000 mg by mouth every 6 (six) hours as needed for mild pain or fever.   Yes [provider]  bumetanide (BUMEX) 2 MG tablet Take 1 tablet (2 mg total) by mouth daily. 03/06/22  Yes Keishawn Rajewski, Otila Kluver A, FNP  cetirizine (ZYRTEC) 10 MG tablet Take 1 tablet (10 mg total) by mouth daily. 12/25/20  Yes Lavina Hamman, MD  cyanocobalamin 1000 MCG tablet Take 1,000 mcg by mouth every other day.   Yes [provider]  metolazone (ZAROXOLYN) 5 MG tablet Take 0.5 tablets by mouth 2 (two) times a week. 03/27/22  Yes [provider]  NYSTATIN powder Apply topically. 03/19/22  Yes [provider]  potassium chloride SA (KLOR-CON M) 20 MEQ tablet Take 2 tablets (40 mEq total) by mouth daily. 11/28/21  Yes Joley Utecht, Otila Kluver A, FNP  pregabalin (LYRICA) 100 MG capsule Take 100 mg by mouth daily.   Yes [provider]  senna-docusate (SENOKOT-S) 8.6-50 MG tablet Take 1 tablet by mouth 2 (two) times daily as needed for mild constipation or moderate constipation.   Yes [provider]   Review of Systems  Constitutional:  Positive  for fatigue. Negative for appetite change.  HENT:  Positive for congestion. Negative for postnasal drip and sore throat.   Eyes: Negative.   Respiratory:  Positive for cough and shortness of breath. Negative for wheezing.   Cardiovascular:  Positive for palpitations and leg swelling. Negative for chest pain.  Gastrointestinal:  Negative for abdominal distention, abdominal pain and constipation.  Endocrine: Negative.   Genitourinary: Negative.   Musculoskeletal:  Positive for arthralgias (right foot)  and back pain (when laying in the bed too long).  Skin: Negative.        Severe lymphedema  Allergic/Immunologic: Negative.   Neurological:  Positive for dizziness (at times). Negative for light-headedness.  Hematological:  Negative for adenopathy. Does not bruise/bleed easily.  Psychiatric/Behavioral:  Positive for sleep disturbance (chronic trouble falling asleep; sleeps in recliner). Negative for dysphoric mood. The patient is nervous/anxious.    Vitals:   04/24/22 1038  BP: 128/80  Pulse: 90  Resp: 18  Weight: (!) 393 lb (178.3 kg)  Height: '5\' 3"'$  (1.6 m)   Wt Readings from Last 3 Encounters:  04/24/22 (!) 393 lb (178.3 kg)  04/11/22 (!) 392 lb 2 oz (177.9 kg)  04/03/22 (!) 390 lb (176.9 kg)   Lab Results  Component Value Date   CREATININE 1.38 (H) 04/11/2022   CREATININE 1.00 04/07/2022   CREATININE 1.17 (H) 04/06/2022   Physical Exam Vitals and nursing note reviewed.  Constitutional:      Appearance: She is well-developed.  HENT:     Head: Normocephalic and atraumatic.  Cardiovascular:     Rate and Rhythm: Normal rate and regular rhythm.  Pulmonary:     Effort: Pulmonary effort is normal. No accessory muscle usage.     Breath sounds: No wheezing, rhonchi or rales.  Abdominal:     Palpations: Abdomen is soft.     Tenderness: There is no abdominal tenderness.  Musculoskeletal:     Cervical back: Normal range of motion and neck supple.     Right lower leg: Edema present.     Left lower leg: Edema present.     Comments: Severe lymphedema  Skin:    General: Skin is warm and dry.  Neurological:     General: No focal deficit present.     Mental Status: She is alert and oriented to person, place, and time.  Psychiatric:        Mood and Affect: Mood is not depressed.        Behavior: Behavior normal.   Assessment & Plan:  1: Chronic heart failure with preserved ejection fraction without structural changes- - NYHA class III - euvolemic today - has attempted  to  weigh daily with standing on 2 scales but it fluctuates too much - weight stable from last visit here 2 weeks ago - limiting her sodium intake but occasionally will eat saltier foods  - understands to keep daily fluid intake to 60-64 ounces/ day - facial/mouth swelling with ACEi - saw cardiology Margarito Courser) 03/19/22 - BNP 10/31/21 was 123.9 - has received flu vaccine for this season  2: HTN with stage 3 CKD- - BP looks good (128/80) - saw PCP Daryel Gerald) @ [facility] last week - saw nephrology Holley Raring) 04/11/22  - continues to take metolazone twice weekly and occasionally doesn't take her bumex just to give her kidneys a break - BMP 04/11/22 reviewed and showed sodium 140, potassium 4.2, creatinine 1.37 and GFR 43  3: Lymphedema- - saw wound center 04/19/22 - both lower legs  currently wrapped; she says that the left leg is no longer weeping  4: Sleep apnea- - wearing CPAP nightly  5: Cellulitis- - resumed another week of cephalexin '500mg'$  BID - says that she may have to be on this long-term   Medication list reviewed.  Return in 2 months, sooner if needed.

## 2022-04-26 DIAGNOSIS — E11621 Type 2 diabetes mellitus with foot ulcer: Secondary | ICD-10-CM | POA: Diagnosis not present

## 2022-04-26 NOTE — Progress Notes (Signed)
[name], [name] ([id number]) [id number] Page 1 of 3 Visit Report for 04/26/2022 Arrival Information Details Patient Name: Date of Service: [name] 04/26/2022 8:00 A M Medical Record Number: [id number] Patient Account Number: [id number] Date of Birth/Sex: Treating RN: March 02, 1956 (66 y.o. F) Primary Care Amelie [name]: Ranae Plumber Other Clinician: Massie Kluver Referring Elorah Dewing: Treating Samwise Eckardt/Extender: Skeet Simmer in Treatment: 38 Visit Information History Since Last Visit All ordered tests and consults were completed: No Patient Arrived: [name] Added or deleted any medications: No Arrival Time: 07:58 Any new allergies or adverse reactions: No Transfer Assistance: None Had a fall or experienced change in No Patient Has Alerts: Yes activities of daily living that may affect Patient Alerts: Borderline Diabetic risk of falls: Hospitalized since last visit: No Pain Present Now: No Electronic Signature(s) Signed: 04/26/2022 11:23:10 AM By: Massie Kluver Entered By: Massie Kluver on 04/26/2022 08:04:11 -------------------------------------------------------------------------------- Clinic Level of Care Assessment Details Patient Name: Date of Service: [name], [name] 04/26/2022 8:00 A M Medical Record Number: [id number] Patient Account Number: [id number] Date of Birth/Sex: Treating RN: [birth year] (66 y.o. F) Primary Care Jerline Linzy: Ranae Plumber Other Clinician: Massie Kluver Referring Treshawn Allen: Treating Isair Inabinet/Extender: Skeet Simmer in Treatment: 38 Clinic Level of Care Assessment Items TOOL 4 Quantity Score '[]'$  - 0 Use when only an EandM is performed on FOLLOW-UP visit ASSESSMENTS - Nursing Assessment / Reassessment '[]'$  - 0 Reassessment of Co-morbidities (includes updates in patient status) '[]'$  - 0 Reassessment of Adherence to Treatment Plan ASSESSMENTS - Wound and Skin A ssessment  / Reassessment '[]'$  - 0 Simple Wound Assessment / Reassessment - one wound '[]'$  - 0 Complex Wound Assessment / Reassessment - multiple wounds [name], [name] ([id number]) [id number] Page 2 of 3 '[]'$  - 0 Dermatologic / Skin Assessment (not related to wound area) ASSESSMENTS - Focused Assessment '[]'$  - 0 Circumferential Edema Measurements - multi extremities '[]'$  - 0 Nutritional Assessment / Counseling / Intervention '[]'$  - 0 Lower Extremity Assessment (monofilament, tuning fork, pulses) '[]'$  - 0 Peripheral Arterial Disease Assessment (using hand held doppler) ASSESSMENTS - Ostomy and/or Continence Assessment and Care '[]'$  - 0 Incontinence Assessment and Management '[]'$  - 0 Ostomy Care Assessment and Management (repouching, etc.) PROCESS - Coordination of Care X - Simple Patient / Family Education for ongoing care 1 15 '[]'$  - 0 Complex (extensive) Patient / Family Education for ongoing care '[]'$  - 0 Staff obtains Programmer, systems, Records, T Results / Process Orders est '[]'$  - 0 Staff telephones HHA, Nursing Homes / Clarify orders / etc '[]'$  - 0 Routine Transfer to another Facility (non-emergent condition) '[]'$  - 0 Routine Hospital Admission (non-emergent condition) '[]'$  - 0 New Admissions / Biomedical engineer / Ordering NPWT Apligraf, etc. , '[]'$  - 0 Emergency Hospital Admission (emergent condition) X- 1 10 Simple Discharge Coordination '[]'$  - 0 Complex (extensive) Discharge Coordination PROCESS - Special Needs '[]'$  - 0 Pediatric / Minor Patient Management '[]'$  - 0 Isolation Patient Management '[]'$  - 0 Hearing / Language / Visual special needs '[]'$  - 0 Assessment of Community assistance (transportation, D/C planning, etc.) '[]'$  - 0 Additional assistance / Altered mentation '[]'$  - 0 Support Surface(s) Assessment (bed, cushion, seat, etc.) INTERVENTIONS - Wound Cleansing / Measurement '[]'$  - 0 Simple Wound Cleansing - one wound X- 2 5 Complex Wound Cleansing - multiple wounds '[]'$  -  0 Wound Imaging (photographs - any number of wounds) '[]'$  - 0 Wound Tracing (instead of photographs) '[]'$  - 0 Simple Wound  Measurement - one wound '[]'$  - 0 Complex Wound Measurement - multiple wounds INTERVENTIONS - Wound Dressings '[]'$  - 0 Small Wound Dressing one or multiple wounds '[]'$  - 0 Medium Wound Dressing one or multiple wounds X- 2 20 Large Wound Dressing one or multiple wounds '[]'$  - 0 Application of Medications - topical '[]'$  - 0 Application of Medications - injection INTERVENTIONS - Miscellaneous '[]'$  - 0 External ear exam '[]'$  - 0 Specimen Collection (cultures, biopsies, blood, body fluids, etc.) '[]'$  - 0 Specimen(s) / Culture(s) sent or taken to Lab for analysis [name], [name] ([id number]) [id number] Page 3 of 3 '[]'$  - 0 Patient Transfer (multiple staff / Civil Service fast streamer / Similar devices) '[]'$  - 0 Simple Staple / Suture removal (25 or less) '[]'$  - 0 Complex Staple / Suture removal (26 or more) '[]'$  - 0 Hypo / Hyperglycemic Management (close monitor of Blood Glucose) '[]'$  - 0 Ankle / Brachial Index (ABI) - do not check if billed separately '[]'$  - 0 Vital Signs Has the patient been seen at the hospital within the last three years: Yes Total Score: 75 Level Of Care: New/Established - Level 2 Electronic Signature(s) Signed: 04/26/2022 11:23:10 AM By: Massie Kluver Entered By: Massie Kluver on 04/26/2022 08:32:37 -------------------------------------------------------------------------------- Encounter Discharge Information Details Patient Name: Date of Service: Elizabeth Matte. 04/26/2022 8:00 A M Medical Record Number: [id number] Patient Account Number: [id number] Date of Birth/Sex: Treating RN: 02-12-1956 (66 y.o. F) Primary Care Kristian Hazzard: Ranae Plumber Other Clinician: Massie Kluver Referring Caydyn Sprung: Treating Haylea Schlichting/Extender: Skeet Simmer in Treatment: 38 Encounter Discharge Information Items Discharge Condition:  Stable Ambulatory Status: Walker Discharge Destination: Home Transportation: Other Accompanied By: self Schedule Follow-up Appointment: Yes Clinical Summary of Care: Electronic Signature(s) Signed: 04/26/2022 11:23:10 AM By: Massie Kluver Entered By: Massie Kluver on 04/26/2022 08:07:05

## 2022-04-26 NOTE — Progress Notes (Signed)
[name], [name] ([id number]) [id number] Page 1 of 9 Visit Report for 04/19/2022 Arrival Information Details Patient Name: Date of Service: [name], [name] 04/19/2022 12:45 PM Medical Record Number: [id number] Patient Account Number: [id number] Date of Birth/Sex: Treating RN: [birth year] (66 y.o. Orvan Falconer Primary Care Gizzelle Lacomb: Ranae Plumber Other Clinician: Massie Kluver Referring Antwan Bribiesca: Treating Julianna Vanwagner/Extender: Skeet Simmer in Treatment: 104 Visit Information History Since Last Visit All ordered tests and consults were completed: No Patient Arrived: Gilford Rile Added or deleted any medications: No Arrival Time: 12:50 Any new allergies or adverse reactions: No Transfer Assistance: None Had a fall or experienced change in No Patient Has Alerts: Yes activities of daily living that may affect Patient Alerts: Borderline Diabetic risk of falls: Hospitalized since last visit: No Pain Present Now: Yes Electronic Signature(s) Signed: 04/26/2022 11:24:03 AM By: Massie Kluver Entered By: Massie Kluver on 04/19/2022 12:56:54 -------------------------------------------------------------------------------- Clinic Level of Care Assessment Details Patient Name: Date of Service: [name], [name] 04/19/2022 12:45 PM Medical Record Number: [id number] Patient Account Number: [id number] Date of Birth/Sex: Treating RN: April 12, 1956 (66 y.o. Orvan Falconer Primary Care Makinzey Banes: Ranae Plumber Other Clinician: Massie Kluver Referring Nathaneal Sommers: Treating Stefanee Mckell/Extender: Skeet Simmer in Treatment: 37 Clinic Level of Care Assessment Items TOOL 4 Quantity Score '[]'$  - 0 Use when only an EandM is performed on FOLLOW-UP visit ASSESSMENTS - Nursing Assessment / Reassessment X- 1 10 Reassessment of Co-morbidities (includes updates in patient status) X- 1 5 Reassessment of Adherence to Treatment Plan ASSESSMENTS -  Wound and Skin A ssessment / Reassessment '[]'$  - 0 Simple Wound Assessment / Reassessment - one wound '[]'$  - 0 Complex Wound Assessment / Reassessment - multiple wounds [name], [name] ([id number]) [id number] Page 2 of 9 '[]'$  - 0 Dermatologic / Skin Assessment (not related to wound area) ASSESSMENTS - Focused Assessment '[]'$  - 0 Circumferential Edema Measurements - multi extremities '[]'$  - 0 Nutritional Assessment / Counseling / Intervention '[]'$  - 0 Lower Extremity Assessment (monofilament, tuning fork, pulses) '[]'$  - 0 Peripheral Arterial Disease Assessment (using hand held doppler) ASSESSMENTS - Ostomy and/or Continence Assessment and Care '[]'$  - 0 Incontinence Assessment and Management '[]'$  - 0 Ostomy Care Assessment and Management (repouching, etc.) PROCESS - Coordination of Care X - Simple Patient / Family Education for ongoing care 1 15 '[]'$  - 0 Complex (extensive) Patient / Family Education for ongoing care '[]'$  - 0 Staff obtains Programmer, systems, Records, T Results / Process Orders est '[]'$  - 0 Staff telephones HHA, Nursing Homes / Clarify orders / etc '[]'$  - 0 Routine Transfer to another Facility (non-emergent condition) '[]'$  - 0 Routine Hospital Admission (non-emergent condition) '[]'$  - 0 New Admissions / Biomedical engineer / Ordering NPWT Apligraf, etc. , '[]'$  - 0 Emergency Hospital Admission (emergent condition) X- 1 10 Simple Discharge Coordination '[]'$  - 0 Complex (extensive) Discharge Coordination PROCESS - Special Needs '[]'$  - 0 Pediatric / Minor Patient Management '[]'$  - 0 Isolation Patient Management '[]'$  - 0 Hearing / Language / Visual special needs '[]'$  - 0 Assessment of Community assistance (transportation, D/C planning, etc.) '[]'$  - 0 Additional assistance / Altered mentation '[]'$  - 0 Support Surface(s) Assessment (bed, cushion, seat, etc.) INTERVENTIONS - Wound Cleansing / Measurement '[]'$  - 0 Simple Wound Cleansing - one wound X- 2 5 Complex Wound  Cleansing - multiple wounds '[]'$  - 0 Wound Imaging (photographs - any number of wounds) X- 1 5 Wound Tracing (instead of photographs) '[]'$  - 0  Simple Wound Measurement - one wound '[]'$  - 0 Complex Wound Measurement - multiple wounds INTERVENTIONS - Wound Dressings '[]'$  - 0 Small Wound Dressing one or multiple wounds X- 2 15 Medium Wound Dressing one or multiple wounds '[]'$  - 0 Large Wound Dressing one or multiple wounds '[]'$  - 0 Application of Medications - topical '[]'$  - 0 Application of Medications - injection INTERVENTIONS - Miscellaneous '[]'$  - 0 External ear exam '[]'$  - 0 Specimen Collection (cultures, biopsies, blood, body fluids, etc.) '[]'$  - 0 Specimen(s) / Culture(s) sent or taken to Lab for analysis [name], [name] ([id number]) [id number].pdf Page 3 of 9 '[]'$  - 0 Patient Transfer (multiple staff / Civil Service fast streamer / Similar devices) '[]'$  - 0 Simple Staple / Suture removal (25 or less) '[]'$  - 0 Complex Staple / Suture removal (26 or more) '[]'$  - 0 Hypo / Hyperglycemic Management (close monitor of Blood Glucose) '[]'$  - 0 Ankle / Brachial Index (ABI) - do not check if billed separately X- 1 5 Vital Signs Has the patient been seen at the hospital within the last three years: Yes Total Score: 90 Level Of Care: New/Established - Level 3 Electronic Signature(s) Signed: 04/26/2022 11:24:03 AM By: Massie Kluver Entered By: Massie Kluver on 04/19/2022 13:34:03 -------------------------------------------------------------------------------- Lower Extremity Assessment Details Patient Name: Date of Service: [name], [name] 04/19/2022 12:45 PM Medical Record Number: [id number] Patient Account Number: [id number] Date of Birth/Sex: Treating RN: 18-Apr-1956 (66 y.o. Orvan Falconer Primary Care Lindley Hiney: Ranae Plumber Other Clinician: Massie Kluver Referring Sharonna Vinje: Treating Gerber Penza/Extender: Skeet Simmer in Treatment: 37 Electronic  Signature(s) Signed: 04/20/2022 12:25:01 PM By: Carlene Coria RN Signed: 04/26/2022 11:24:03 AM By: Massie Kluver Entered By: Massie Kluver on 04/19/2022 13:15:06 -------------------------------------------------------------------------------- Multi Wound Chart Details Patient Name: Date of Service: [name], [name] 04/19/2022 12:45 PM Medical Record Number: [id number] Patient Account Number: [id number] Date of Birth/Sex: Treating RN: Apr 20, 1956 (66 y.o. Orvan Falconer Primary Care Dell Hurtubise: Ranae Plumber Other Clinician: Massie Kluver Referring Cheyne Bungert: Treating Cristofer Yaffe/Extender: Skeet Simmer in Treatment: 37 Vital Signs Height(in): 63 Pulse(bpm): 88 Weight(lbs): 818 Blood Pressure(mmHg): 119/80 Body Mass Index(BMI): 65.9 Temperature(F): 98.0 Respiratory Rate(breaths/min): 18 Flippen, Bennetta C ([id number]) [phone].pdf Page 4 of 9 [10:Photos:] [N/A:N/A] Left, Medial, Anterior Lower Leg N/A N/A Wound Location: Gradually Appeared N/A N/A Wounding Event: Lymphedema N/A N/A Primary Etiology: Cataracts, Chronic sinus N/A N/A Comorbid History: problems/congestion, Anemia, Lymphedema, Asthma, Sleep Apnea, Tuberculosis, Angina, Hypertension, Gout, Osteoarthritis 10/17/2021 N/A N/A Date Acquired: 40 N/A N/A Weeks of Treatment: Open N/A N/A Wound Status: No N/A N/A Wound Recurrence: 15x25x0.1 N/A N/A Measurements L x W x D (cm) 294.524 N/A N/A A (cm) : rea 29.452 N/A N/A Volume (cm) : -3650.00% N/A N/A % Reduction in Area: -3651.80% N/A N/A % Reduction in Volume: Full Thickness Without Exposed N/A N/A Classification: Support Structures Medium N/A N/A Exudate A mount: Serosanguineous N/A N/A Exudate Type: red, brown N/A N/A Exudate Color: Indistinct, nonvisible N/A N/A Wound Margin: Medium (34-66%) N/A N/A Granulation A mount: Red N/A N/A Granulation Quality: Small (1-33%) N/A N/A Necrotic A  mount: Fat Layer (Subcutaneous Tissue): Yes N/A N/A Exposed Structures: Large (67-100%) N/A N/A Epithelialization: Treatment Notes Electronic Signature(s) Signed: 04/26/2022 11:24:03 AM By: Massie Kluver Entered By: Massie Kluver on 04/19/2022 13:15:20 -------------------------------------------------------------------------------- Multi-Disciplinary Care Plan Details Patient Name: Date of Service: [name] 04/19/2022 12:45 PM Medical Record Number: [id number] Patient Account Number: [id number] Date of Birth/Sex: Treating RN: [birth year] (66  y.o. Orvan Falconer Primary Care Gray Maugeri: Ranae Plumber Other Clinician: Massie Kluver Referring Xoe Hoe: Treating Bostyn Kunkler/Extender: Skeet Simmer in Treatment: 37 Active Inactive Wound/Skin Impairment Nursing Diagnoses: WONDA, GOODGAME ([id number]) [phone].pdf Page 5 of 9 Impaired tissue integrity Knowledge deficit related to ulceration/compromised skin integrity Goals: Ulcer/skin breakdown will have a volume reduction of 30% by week 4 Date Initiated: 08/01/2021 Target Resolution Date: 08/29/2021 Goal Status: Active Ulcer/skin breakdown will have a volume reduction of 50% by week 8 Date Initiated: 08/01/2021 Target Resolution Date: 09/26/2021 Goal Status: Active Ulcer/skin breakdown will have a volume reduction of 80% by week 12 Date Initiated: 08/01/2021 Target Resolution Date: 10/24/2021 Goal Status: Active Ulcer/skin breakdown will heal within 14 weeks Date Initiated: 08/01/2021 Target Resolution Date: 11/07/2021 Goal Status: Active Interventions: Assess patient/caregiver ability to obtain necessary supplies Assess patient/caregiver ability to perform ulcer/skin care regimen upon admission and as needed Assess ulceration(s) every visit Provide education on ulcer and skin care Notes: Electronic Signature(s) Signed: 04/20/2022 12:25:01 PM By: Carlene Coria RN Signed:  04/26/2022 11:24:03 AM By: Massie Kluver Entered By: Massie Kluver on 04/19/2022 13:15:11 -------------------------------------------------------------------------------- Non-Wound Condition Assessment Details Patient Name: Date of Service: [name] 04/19/2022 12:45 PM Medical Record Number: [id number] Patient Account Number: [id number] Date of Birth/Sex: Treating RN: Aug 20, 1955 (66 y.o. Orvan Falconer Primary Care Rande Dario: Ranae Plumber Other Clinician: Massie Kluver Referring Nicolas Banh: Treating Barnaby Rippeon/Extender: Skeet Simmer in Treatment: 12 Non-Wound Condition: Condition: Lymphedema Location: Leg Side: Right Notes: Right leg with decreased drainage this visit. Photos Notes [name], [name] ([id number]) [id number] Page 6 of 9 increased weeping to right leg today Electronic Signature(s) Signed: 04/20/2022 12:25:01 PM By: Carlene Coria RN Signed: 04/26/2022 11:24:03 AM By: Massie Kluver Entered By: Massie Kluver on 04/19/2022 13:27:37 -------------------------------------------------------------------------------- Non-Wound Condition Assessment Details Patient Name: Date of Service: [name], [name] 04/19/2022 12:45 PM Medical Record Number: [id number] Patient Account Number: [id number] Date of Birth/Sex: Treating RN: [birth year] (66 y.o. Orvan Falconer Primary Care Johnie Stadel: Ranae Plumber Other Clinician: Massie Kluver Referring Clarice Bonaventure: Treating Shawnya Mayor/Extender: Skeet Simmer in Treatment: 8 Non-Wound Condition: Condition: Lymphedema Location: Leg Side: Left Notes: decreased drainage this visit. Photos Notes increased weeping to leg today Electronic Signature(s) Signed: 04/20/2022 12:25:01 PM By: Carlene Coria RN Signed: 04/26/2022 11:24:03 AM By: Massie Kluver Entered By: Massie Kluver on 04/19/2022  13:27:41 -------------------------------------------------------------------------------- Pain Assessment Details Patient Name: Date of Service: [name], [name] 04/19/2022 12:45 PM [name] ([id number]) [id number] Page 7 of 9 Medical Record Number: [id number] Patient Account Number: [id number] Date of Birth/Sex: Treating RN: 05-12-1956 (66 y.o. Orvan Falconer Primary Care Estelle Greenleaf: Ranae Plumber Other Clinician: Massie Kluver Referring Kol Consuegra: Treating Jeily Guthridge/Extender: Skeet Simmer in Treatment: 37 Active Problems Location of Pain Severity and Description of Pain Patient Has Paino Yes Site Locations Pain Location: Generalized Pain, Pain in Ulcers Duration of the Pain. Constant / Intermittento Constant Rate the pain. Current Pain Level: 8 Character of Pain Describe the Pain: Aching Pain Management and Medication Current Pain Management: Medication: Yes Rest: Yes Electronic Signature(s) Signed: 04/20/2022 12:25:01 PM By: Carlene Coria RN Signed: 04/26/2022 11:24:03 AM By: Massie Kluver Entered By: Massie Kluver on 04/19/2022 13:00:06 -------------------------------------------------------------------------------- Patient/Caregiver Education Details Patient Name: Date of Service: [name] 10/12/2023andnbsp12:45 PM Medical Record Number: [id number] Patient Account Number: [id number] Date of Birth/Gender: Treating RN: May 28, 1956 (66 y.o. Orvan Falconer Primary Care Physician: Daryel Gerald ,  Vaughan Basta Other Clinician: Massie Kluver Referring Physician: Treating Physician/Extender: Skeet Simmer in Treatment: 57 Education Assessment Education Provided To: Patient Education Topics Provided Wound/Skin Impairment: Handouts: Other: continue wound care as directed [name], SCHMALTZ ([id number]) [id number] Page 8 of 9 Methods: Explain/Verbal Responses: State  content correctly Electronic Signature(s) Signed: 04/26/2022 11:24:03 AM By: Massie Kluver Entered By: Massie Kluver on 04/19/2022 13:52:54 -------------------------------------------------------------------------------- Wound Assessment Details Patient Name: Date of Service: [name], [name] 04/19/2022 12:45 PM Medical Record Number: [id number] Patient Account Number: [id number] Date of Birth/Sex: Treating RN: 28-Sep-1955 (66 y.o. Orvan Falconer Primary Care Hamed Debella: Ranae Plumber Other Clinician: Massie Kluver Referring Norris Bodley: Treating Caleb Decock/Extender: Skeet Simmer in Treatment: 37 Wound Status Wound Number: 10 Primary Lymphedema Etiology: Wound Location: Left, Medial, Anterior Lower Leg Wound Healed - Epithelialized Wounding Event: Gradually Appeared Status: Date Acquired: 10/17/2021 Comorbid Cataracts, Chronic sinus problems/congestion, Anemia, Weeks Of Treatment: 26 History: Lymphedema, Asthma, Sleep Apnea, Tuberculosis, Angina, Clustered Wound: No Hypertension, Gout, Osteoarthritis Photos Wound Measurements Length: (cm) Width: (cm) Depth: (cm) Area: (cm) Volume: (cm) 0 % Reduction in Area: 100% 0 % Reduction in Volume: 100% 0 Epithelialization: Large (67-100%) 0 0 Wound Description Classification: Full Thickness Without Exposed Suppor Wound Margin: Indistinct, nonvisible Exudate Amount: Medium Exudate Type: Serosanguineous Exudate Color: red, brown t Structures Foul Odor After Cleansing: No Slough/Fibrino Yes Wound Bed Granulation Amount: Medium (34-66%) Exposed Structure Granulation Quality: Red Fat Layer (Subcutaneous Tissue) Exposed: Yes Necrotic Amount: Small (1-33%) TIFFANIE, BLASSINGAME ([id number]) [phone].pdf Page 9 of 9 Electronic Signature(s) Signed: 04/20/2022 12:25:01 PM By: Carlene Coria RN Signed: 04/26/2022 11:24:03 AM By: Massie Kluver Entered By: Massie Kluver on 04/19/2022  13:24:20 -------------------------------------------------------------------------------- Vitals Details Patient Name: Date of Service: ABBIGAL, RADICH 04/19/2022 12:45 PM Medical Record Number: [id number] Patient Account Number: [id number] Date of Birth/Sex: Treating RN: [birth year] (66 y.o. Orvan Falconer Primary Care Lyanne Kates: Ranae Plumber Other Clinician: Massie Kluver Referring Meena Barrantes: Treating Rhian Asebedo/Extender: Skeet Simmer in Treatment: 37 Vital Signs Time Taken: 12:57 Temperature (F): 98.0 Height (in): 63 Pulse (bpm): 88 Weight (lbs): 372 Respiratory Rate (breaths/min): 18 Body Mass Index (BMI): 65.9 Blood Pressure (mmHg): 119/80 Reference Range: 80 - 120 mg / dl Electronic Signature(s) Signed: 04/26/2022 11:24:03 AM By: Massie Kluver Entered By: Massie Kluver on 04/19/2022 12:59:58

## 2022-05-02 ENCOUNTER — Other Ambulatory Visit: Payer: Self-pay | Admitting: Family

## 2022-05-02 MED ORDER — METOLAZONE 5 MG PO TABS: 2.5000 mg | ORAL_TABLET | ORAL | 3 refills | Status: DC

## 2022-05-02 NOTE — Progress Notes (Signed)
AVIAN, GREENAWALT ([id number]) [id number] Page 1 of 2 Visit Report for 04/26/2022 Physician Orders Details Patient Name: Date of Service: [name], [name] 04/26/2022 8:00 A M Medical Record Number: [id number] Patient Account Number: [id number] Date of Birth/Sex: Treating RN: 04-Oct-1955 (66 y.o. F) Primary Care Provider: Ranae Plumber Other Clinician: Massie Kluver Referring Provider: Treating Provider/Extender: Skeet Simmer in Treatment: 23 Verbal / Phone Orders: No Diagnosis Coding Follow-up Appointments Return Appointment in 1 week. Nurse Visit as needed Bathing/ Shower/ Hygiene Clean wound with Normal Saline or wound cleanser. Wash wounds with antibacterial soap and water. May shower; gently cleanse wound with antibacterial soap, rinse and pat dry prior to dressing wounds No tub bath. Edema Control - Lymphedema / Segmental Compressive Device / Other A wraps ce Elevate, Exercise Daily and A void Standing for Long Periods of Time. Elevate leg(s) parallel to the floor when sitting. DO YOUR BEST to sleep in the bed at night. DO NOT sleep in your recliner. Long hours of sitting in a recliner leads to swelling of the legs and/or potential wounds on your backside. Other: - use lymphedema pumps Non-Wound Condition Bilateral Lower Extremities Cleanse affected area with antibacterial soap and water, pply appropriate compression. - use abdominal pads, and chuck pads, them ace wrap to secure A dditional non-wound orders/instructions: - silver cell to open weeping areas A Medications-Please add to medication list. ntibiotics - continue Keflex as directed P.O. A Electronic Signature(s) Signed: 04/26/2022 11:23:10 AM By: Massie Kluver Signed: 05/01/2022 6:28:37 PM By: Worthy Keeler PA-C Entered By: Massie Kluver on 04/26/2022 08:06:26 -------------------------------------------------------------------------------- SuperBill  Details Patient Name: Date of Service: [name] 04/26/2022 Medical Record Number: [id number] Patient Account Number: [id number] [name], [name] ([id number]) [id number] Page 2 of 2 Date of Birth/Sex: Treating RN: 1956-02-09 (66 y.o. 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

## 2022-05-03 ENCOUNTER — Encounter: Payer: Medicare HMO | Admitting: Physician Assistant

## 2022-05-03 DIAGNOSIS — E11621 Type 2 diabetes mellitus with foot ulcer: Secondary | ICD-10-CM | POA: Diagnosis not present

## 2022-05-03 NOTE — Progress Notes (Addendum)
[name], [name] ([id number]) [id number] Page 1 of 7 Visit Report for 05/03/2022 Arrival Information Details Patient Name: Date of Service: [name], [name] 05/03/2022 11:15 A M Medical Record Number: [id number] Patient Account Number: [id number] Date of Birth/Sex: Treating RN: 1955/07/31 (66 y.o. Charolette Forward, Kim Primary Care Donyell Carrell: Ranae Plumber Other Clinician: Massie Kluver Referring Travious Vanover: Treating Emilygrace Grothe/Extender: Skeet Simmer in Treatment: 39 Visit Information History Since Last Visit All ordered tests and consults were completed: No Patient Arrived: Gilford Rile Added or deleted any medications: No Arrival Time: 11:09 Any new allergies or adverse reactions: No Transfer Assistance: None Had a fall or experienced change in No Patient Has Alerts: Yes activities of daily living that may affect Patient Alerts: Borderline Diabetic risk of falls: Hospitalized since last visit: No Pain Present Now: No Electronic Signature(s) Signed: 05/07/2022 5:32:10 PM By: Massie Kluver Entered By: Massie Kluver on 05/03/2022 11:10:16 -------------------------------------------------------------------------------- Clinic Level of Care Assessment Details Patient Name: Date of Service: [name] 05/03/2022 11:15 A M Medical Record Number: [id number] Patient Account Number: [id number] Date of Birth/Sex: Treating RN: [birth year] (66 y.o. Marlowe Shores Primary Care Tesneem Dufrane: Ranae Plumber Other Clinician: Massie Kluver Referring Vinh Sachs: Treating Neville Pauls/Extender: Skeet Simmer in Treatment: 39 Clinic Level of Care Assessment Items TOOL 4 Quantity Score '[]'$  - 0 Use when only an EandM is performed on FOLLOW-UP visit ASSESSMENTS - Nursing Assessment / Reassessment X- 1 10 Reassessment of Co-morbidities (includes updates in patient status) X- 1 5 Reassessment of Adherence to Treatment Plan ASSESSMENTS - Wound  and Skin A ssessment / Reassessment X - Simple Wound Assessment / Reassessment - one wound 1 5 '[]'$  - 0 Complex Wound Assessment / Reassessment - multiple wounds [name] ([id number]) [id number] Page 2 of 7 '[]'$  - 0 Dermatologic / Skin Assessment (not related to wound area) ASSESSMENTS - Focused Assessment '[]'$  - 0 Circumferential Edema Measurements - multi extremities '[]'$  - 0 Nutritional Assessment / Counseling / Intervention '[]'$  - 0 Lower Extremity Assessment (monofilament, tuning fork, pulses) '[]'$  - 0 Peripheral Arterial Disease Assessment (using hand held doppler) ASSESSMENTS - Ostomy and/or Continence Assessment and Care '[]'$  - 0 Incontinence Assessment and Management '[]'$  - 0 Ostomy Care Assessment and Management (repouching, etc.) PROCESS - Coordination of Care X - Simple Patient / Family Education for ongoing care 1 15 '[]'$  - 0 Complex (extensive) Patient / Family Education for ongoing care '[]'$  - 0 Staff obtains Programmer, systems, Records, T Results / Process Orders est '[]'$  - 0 Staff telephones HHA, Nursing Homes / Clarify orders / etc '[]'$  - 0 Routine Transfer to another Facility (non-emergent condition) '[]'$  - 0 Routine Hospital Admission (non-emergent condition) '[]'$  - 0 New Admissions / Biomedical engineer / Ordering NPWT Apligraf, etc. , '[]'$  - 0 Emergency Hospital Admission (emergent condition) X- 1 10 Simple Discharge Coordination '[]'$  - 0 Complex (extensive) Discharge Coordination PROCESS - Special Needs '[]'$  - 0 Pediatric / Minor Patient Management '[]'$  - 0 Isolation Patient Management '[]'$  - 0 Hearing / Language / Visual special needs '[]'$  - 0 Assessment of Community assistance (transportation, D/[name] planning, etc.) '[]'$  - 0 Additional assistance / Altered mentation '[]'$  - 0 Support Surface(s) Assessment (bed, cushion, seat, etc.) INTERVENTIONS - Wound Cleansing / Measurement X - Simple Wound Cleansing - one wound 1 5 '[]'$  - 0 Complex Wound Cleansing -  multiple wounds '[]'$  - 0 Wound Imaging (photographs - any number of wounds) '[]'$  - 0 Wound Tracing (instead of photographs) '[]'$  - 0  Simple Wound Measurement - one wound '[]'$  - 0 Complex Wound Measurement - multiple wounds INTERVENTIONS - Wound Dressings '[]'$  - 0 Small Wound Dressing one or multiple wounds X- 1 15 Medium Wound Dressing one or multiple wounds '[]'$  - 0 Large Wound Dressing one or multiple wounds '[]'$  - 0 Application of Medications - topical '[]'$  - 0 Application of Medications - injection INTERVENTIONS - Miscellaneous '[]'$  - 0 External ear exam '[]'$  - 0 Specimen Collection (cultures, biopsies, blood, body fluids, etc.) '[]'$  - 0 Specimen(s) / Culture(s) sent or taken to Lab for analysis [name], [name] ([id number]) [id number] Page 3 of 7 '[]'$  - 0 Patient Transfer (multiple staff / Civil Service fast streamer / Similar devices) '[]'$  - 0 Simple Staple / Suture removal (25 or less) '[]'$  - 0 Complex Staple / Suture removal (26 or more) '[]'$  - 0 Hypo / Hyperglycemic Management (close monitor of Blood Glucose) '[]'$  - 0 Ankle / Brachial Index (ABI) - do not check if billed separately X- 1 5 Vital Signs Has the patient been seen at the hospital within the last three years: Yes Total Score: 70 Level Of Care: New/Established - Level 2 Electronic Signature(s) Signed: 05/07/2022 5:32:10 PM By: Massie Kluver Entered By: Massie Kluver on 05/03/2022 11:57:01 -------------------------------------------------------------------------------- Encounter Discharge Information Details Patient Name: Date of Service: [name] 05/03/2022 11:15 A M Medical Record Number: [id number] Patient Account Number: [id number] Date of Birth/Sex: Treating RN: 18-Feb-1956 (66 y.o. Marlowe Shores Primary Care Kynedi Profitt: Ranae Plumber Other Clinician: Massie Kluver Referring Beckett Maden: Treating Raynisha Avilla/Extender: Skeet Simmer in Treatment: 39 Encounter Discharge Information  Items Discharge Condition: Stable Ambulatory Status: Walker Discharge Destination: Home Transportation: Other Accompanied By: self Schedule Follow-up Appointment: Yes Clinical Summary of Care: Electronic Signature(s) Signed: 05/07/2022 5:32:10 PM By: Massie Kluver Entered By: Massie Kluver on 05/03/2022 11:58:03 -------------------------------------------------------------------------------- Lower Extremity Assessment Details Patient Name: Date of Service: [name], [name] 05/03/2022 11:15 A M Medical Record Number: [id number] Patient Account Number: [id number] Date of Birth/Sex: Treating RN: [birth year] (66 y.o. Marlowe Shores Primary Care Keimari Greenfield: Ranae Plumber Other Clinician: Massie Kluver Referring Jermane Brayboy: Treating Dene Nazir/Extender: Ritaj, Dullea ([id number]) [phone].pdf Page 4 of 7 Weeks in Treatment: 39 Electronic Signature(s) Signed: 05/03/2022 4:06:10 PM By: Gretta Cool, BSN, RN, CWS, Kim RN, BSN Signed: 05/07/2022 5:32:10 PM By: Massie Kluver Entered By: Massie Kluver on 05/03/2022 11:21:55 -------------------------------------------------------------------------------- Multi Wound Chart Details Patient Name: Date of Service: [name], [name] 05/03/2022 11:15 A M Medical Record Number: [id number] Patient Account Number: [id number] Date of Birth/Sex: Treating RN: 1955/08/31 (66 y.o. Marlowe Sh[place] Primary Care Domingos Riggi: Ranae Plumber Other Clinician: Massie Kluver Referring Jaelynne Hockley: Treating Embree Brawley/Extender: Skeet Simmer in Treatment: 39 Vital Signs Height(in): 63 Pulse(bpm): 85 Weight(lbs): 372 Blood Pressure(mmHg): 118/63 Body Mass Index(BMI): 65.9 Temperature(F): 98.0 Respiratory Rate(breaths/min): 18 [Treatment Notes:Wound Assessments Treatment Notes] Electronic Signature(s) Signed: 05/07/2022 5:32:10 PM By: Massie Kluver Entered By: Massie Kluver on 05/03/2022  11:22:14 -------------------------------------------------------------------------------- Multi-Disciplinary Care Plan Details Patient Name: Date of Service: [name], [name] 05/03/2022 11:15 A M Medical Record Number: [id number] Patient Account Number: [id number] Date of Birth/Sex: Treating RN: [birth year] (66 y.o. Marlowe Sh[place] Primary Care Chasady Longwell: Ranae Plumber Other Clinician: Massie Kluver Referring Andris Brothers: Treating Charie Pinkus/Extender: Skeet Simmer in Treatment: [address] [name], [name] ([id number]) [id number] Page 5 of 7 Nursing Diagnoses: Impaired tissue integrity Knowledge deficit related to ulceration/compromised skin integrity Goals: Ulcer/skin breakdown will have a volume  reduction of 30% by week 4 Date Initiated: 08/01/2021 Target Resolution Date: 08/29/2021 Goal Status: Active Ulcer/skin breakdown will have a volume reduction of 50% by week 8 Date Initiated: 08/01/2021 Target Resolution Date: 09/26/2021 Goal Status: Active Ulcer/skin breakdown will have a volume reduction of 80% by week 12 Date Initiated: 08/01/2021 Target Resolution Date: 10/24/2021 Goal Status: Active Ulcer/skin breakdown will heal within 14 weeks Date Initiated: 08/01/2021 Target Resolution Date: 11/07/2021 Goal Status: Active Interventions: Assess patient/caregiver ability to obtain necessary supplies Assess patient/caregiver ability to perform ulcer/skin care regimen upon admission and as needed Assess ulceration(s) every visit Provide education on ulcer and skin care Notes: Electronic Signature(s) Signed: 05/03/2022 4:06:10 PM By: Gretta Cool, BSN, RN, CWS, Kim RN, BSN Signed: 05/07/2022 5:32:10 PM By: Massie Kluver Entered By: Massie Kluver on 05/03/2022 11:22:08 -------------------------------------------------------------------------------- Pain Assessment Details Patient Name: Date of Service: [name], [name] 05/03/2022 11:15 A M Medical Record Number: [id number] Patient Account Number: [id number] Date of Birth/Sex: Treating RN: 11-17-1955 (66 y.o. Charolette Forward, Kim Primary Care Zivah Mayr: Ranae Plumber Other Clinician: Massie Kluver Referring Sonu Kruckenberg: Treating Von Quintanar/Extender: Skeet Simmer in Treatment: 39 Active Problems Location of Pain Severity and Description of Pain Patient Has Paino No Site Locations [name], [name] ([id number]) [id number] Page 6 of 7 Pain Management and Medication Current Pain Management: Electronic Signature(s) Signed: 05/03/2022 4:06:10 PM By: Gretta Cool, BSN, RN, CWS, Kim RN, BSN Signed: 05/07/2022 5:32:10 PM By: Massie Kluver Entered By: Massie Kluver on 05/03/2022 11:21:45 -------------------------------------------------------------------------------- Patient/Caregiver Education Details Patient Name: Date of Service: [name] 10/26/2023andnbsp11:15 [place] Record Number: [id number] Patient Account Number: [id number] Date of Birth/Gender: Treating RN: 04-04-1956 (66 y.o. Marlowe Shores Primary Care Physician: Ranae Plumber Other Clinician: Massie Kluver Referring Physician: Treating Physician/Extender: Skeet Simmer in Treatment: 39 Education Assessment Education Provided To: Patient Education Topics Provided Wound/Skin Impairment: Handouts: Other: CONTINUE WOUND CARE AS DIRECTED Electronic Signature(s) Signed: 05/07/2022 5:32:10 PM By: Massie Kluver Entered By: Massie Kluver on 05/03/2022 11:57:30 -------------------------------------------------------------------------------- Vitals Details Patient Name: Date of Service: [name] 05/03/2022 11:15 A M Medical Record Number: [id number] Patient Account Number: [id number] Date of Birth/Sex: Treating RN: 12-02-1955 (66 y.o. Marlowe Shores Primary Care Everet Flagg: Ranae Plumber Other Clinician: Massie Kluver Referring Jaklyn Alen: Treating Jocelyne Reinertsen/Extender: Skeet Simmer in Treatment: 39 Vital Signs Time Taken: 11:10 Temperature (F): 98.0 Height (in): 63 Pulse (bpm): 85 [name], [name] ([id number]) [id number] Page 7 of 7 Weight (lbs): 372 Respiratory Rate (breaths/min): 18 Body Mass Index (BMI): 65.9 Blood Pressure (mmHg): 118/63 Reference Range: 80 - 120 mg / dl Electronic Signature(s) Signed: 05/07/2022 5:32:10 PM By: Massie Kluver Entered By: Massie Kluver on 05/03/2022 11:21:40

## 2022-05-03 NOTE — Progress Notes (Addendum)
[name], [name] ([id number]) [id number] Page 1 of 13 Visit Report for 05/03/2022 Chief Complaint Document Details Patient Name: Date of Service: Elizabeth, [name] 05/03/2022 11:15 A M Medical Record Number: [id number] Patient Account Number: [id number] Date of Birth/Sex: Treating RN: 1956/02/29 (66 y.o. [place] Primary Care Provider: Ranae Plumber Other Clinician: Massie Kluver Referring Provider: Treating Provider/Extender: Skeet Simmer in Treatment: 39 Information Obtained from: Patient Chief Complaint Bilateral LE lymphedema with Left LE Ulcers Electronic Signature(s) Signed: 05/03/2022 11:04:10 AM By: Worthy Keeler PA-C Entered By: Worthy Keeler on 05/03/2022 11:04:10 -------------------------------------------------------------------------------- HPI Details Patient Name: Date of Service: Elizabeth, [name] 05/03/2022 11:15 A M Medical Record Number: [id number] Patient Account Number: [id number] Date of Birth/Sex: Treating RN: [birth year] (66 y.o. [place] Primary Care Provider: Ranae Plumber Other Clinician: Massie Kluver Referring Provider: Treating Provider/Extender: Skeet Simmer in Treatment: 39 History of Present Illness HPI Description: The patient is a 66 year old female with history of hypertension and a long-standing history of bilateral lower extremity lymphedema (first presented on 4/2) . She has had open ulcers in the past which have always responded to compression therapy. She had briefly been to a lymphedema clinic in the past which helped her at the time. this time around she stopped treatment of her lymphedema pumps approximately 2 weeks ago because of some pain in the knees and then noticed the right leg getting worse. She was seen by her PCP who put her on clindamycin 4 times a day 2 days ago. The patient has seen [facility] and Dr. Delana Meyer had seen her last year where a  vascular study including venous and arterial duplex studies were within normal limits. he had recommended compression stockings and lymphedema pumps and the patient has been using this in about 2 weeks ago. She is known to be diabetic but in the past few time she's gone to her primary care doctor her hemoglobin A1c has been normal. 02/11/2015 - after her last visit she took my advice and went to the ER regarding the progressive cellulitis of her right lower extremity and she was admitted between July 17 and 22nd. She received IV antibiotics and then was sent home on a course of steroid-induced and oral antibiotics. She has improved much since then. 02/17/2015 -- she has been doing fine and the weeping of her legs has remarkably gone down. She has no fresh issues. [name], [name] ([id number]) [id number] Page 2 of 13 01/15/18 This patient was given this clinic before most recently in 2016 seen by Dr. Con Memos. She has massive bilateral lymphedema and over the last 2 months this had weeping edema out of the left leg. She has compression pumps but her compliance with these has been minimal. She has [facility] they've been using TCA/ABDs/kerlix under an Ace wrap.she has had recent problems with cellulitis. She was apparently seen in the ER and 12/23/17 and given clindamycin. She was then followed by her primary doctor and given doxycycline and Keflex. The pain seems to have settled down. In April 2018 the patient had arterial studies done at [facility]. This showed triphasic waveforms throughout the right leg and mostly triphasic waveforms on the left except for monophasic at the posterior tibial artery distally. She was not felt to have evidence of right lower extremity arterial stenosis or significant problems on the left side. 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 She also had a right  lower extremity venous  Doppler in January 2018 this was limited by the patient's body habitus and lymphedema. Most of the proximal veins were not visualized The patient presents with an area of denuded skin on the anterior medial part of the left calf. There is weeping edema fluid here. 01/22/18; the patient has somewhat better edema control using her compression pumps twice a day and as a result she has much better epithelialization on the left anterior calf area. Only a small open area remains. 01/29/18; the patient has been compliant with her compression pumps. Both the areas on her calf that healed. The remaining area on the left anterior leg is fully epithelialized Readmission: 02/20/2019 upon evaluation today patient presents for reevaluation due to issues that she is having with the bilateral lower extremities. She actually has wounds open on both legs. On the right she has an area in the crease of her leg on the right around the knee region which is actually draining quite a bit and actually has some fungal type appearance to it. She has been on nystatin powder that seems to have helped to some degree. In regard to the left lower extremity this is actually in the lower portion of her leg closer to the ankle and again is continuing to drain as well unfortunately. There does not appear to be any signs of active infection at this time which is good news. No fevers, chills, nausea, vomiting, or diarrhea. She tells me that since she was seen last year she is actually been doing quite well for the most part with regard to her lower extremities. Unfortunately she now is experiencing a little bit more drainage at this time. She is concerned about getting this under control so that it does not get significantly worse. 02/27/2019 on evaluation today patient appears to be doing somewhat better in regard to her bilateral lower extremity wounds. She has been tolerating the dressing changes without complication. Fortunately there is  no signs of active infection at this point. No fevers, chills, nausea, vomiting, or diarrhea. She did get her dressing supplies which is excellent news she was extremely excited to get these. She also got paperwork from [employer] for their financial assistance program where they may be able to help her out in the future if needed with supplies at discounted prices. 03/06/2019 on evaluation today patient appears to be doing a little worse with regard to both areas of weeping on her bilateral lower extremities. This is around the right medial knee and just above the left ankle. With that being said she is unfortunately not doing as well as I would like to see. I feel like she may need to potentially go see someone at the lymphedema clinic as the wraps that she needs or even beyond what we can do here at the wound care center. She really does not have wounds she just has open areas of weeping that are causing some difficulty for her. Subsequently because of this and the moisture I am concerned about the potential for infection I am going to likely give her a prophylactic antibiotic today, Keflex, just to be on the safe side. Nonetheless again there is no obvious signs of active infection at this time. 03/13/2019 on evaluation today patient appears to be doing well with regard to her bilateral lower extremities where she has been weeping compared to even last week's evaluation. I see some areas of new skin growth which is excellent and overall I am very pleased with  how things seem to be progressing. No fevers, chills, nausea, vomiting, or diarrhea. 03/20/2019 on evaluation today patient unfortunately is continuing to have issues with significant edema of the left lower extremity. Her right side seems to be doing much better. Unfortunately her left side is showing increased weeping of the lower portion of her leg. This is quite unfortunate obviously we were hoping to get her into the lymphedema clinic they really  do not seem to when I see her how if she is draining. Despite the fact this is really not wound related but more lymphedema weeping related. Nonetheless I do not know that this can be helpful for her to even go for that appointment since again I am not sure there is much that they would actually do at this point. We may need to try a 4 layer compression wrap as best we can on her leg. She is on the Augmentin currently although I am still concerned about whether or not there could be potentially something going on infection wise I would obtain a culture though I understand is not the best being that is a surface culture I just 1 to make sure I do not seem to be missing anything. 03/27/2019 on evaluation today patient appears to be doing much better in regard to the left lower extremity compared to last week. Last week she had tremendous weeping which I think was subsequent to infection now she seems to be doing much better and very pleased. This is not completely healed but there is a lot of new skin growth and it has dried out quite a bit. Overall I think that we are doing well with how things are moving along at this time. No fevers, chills, nausea, vomiting, or diarrhea. 04/03/2019 evaluation today patient appears to be doing a little worse this week compared to last time I saw her. I think this may be due to the fact that she is having issues with not being able to sleep in her bed at least not until last night. She is therefore been in a lift chair and subsequently has also had issues with not been able to use her pumps since she could not get in bed. With that being said the patient overall seems to be doing okay I do think I may want extend the antibiotic for a little bit longer at least until we can see if her edema and her weeping gets better and if it is then obviously I can always discontinue the antibiotics as of next week however I want her to continue to have it over the next week. 04/10/2019  on evaluation today patient unfortunately is still doing poorly with regard to her left lower extremity. Her right is all things considering doing fairly well. On the left however she continues to have spreading of the area of infection and weeping which appears to be even a larger surface area than noted last week. She did have a positive culture for Pseudomonas in particular which seems to have been of concern she still has green/yellow discharge consistent with Pseudomonas and subsequently a tremendous amount of it. This has me obviously still concerned about the infection not really clearing up despite the fact that on culture it appears the Cipro should have been a good option for treating this. I think she may at this point need IV antibiotics since things are not doing better I do not want to get worse and cause sepsis. She is in agreement with the  plan and believes as well that she likely does need to go to the hospital for IV vancomycin. Or something of the like depending on what the recommendation is from the ER. 04/17/2019 on evaluation today patient appears to be doing excellent in regard to her lower extremity on the left. She was in the hospital for several days from when I sent her last we saw her until just this past Tuesday. Fortunately her drainage is significantly improved and in fact is mostly clear. There is just a couple small areas that may still drain a little bit she states that the [facility] they prescribed for her at discharge she went picked up from pharmacy and got home but has not been able to find it since. She is looked everywhere. She is wondering if I will replace that for her today I will be more than happy to do that. 05/01/2019 on evaluation today patient actually appears to be doing quite well with regard to her lower extremities. She occasionally is having areas that will leak and then heal up mainly when a piece of the fibrotic skin pops off but fortunately she is not  having any signs of active infection at this time. Overall she also really does not have any obvious weeping at this time. I do believe however she really needs some compression wraps and I think this may be a good time to get her back to the lymphedema clinic. 05/11/2019 on evaluation today patient actually appears to be doing quite well with regard to her bilateral lower extremities. She occasionally will have a small area that we per another but in general seems to be completely healed which is great news. Overall very pleased with how everything seems to be progressing. She does have her appointment with lymphedema clinic on November 18. 05/25/2019 on evaluation today patient appears to be doing well with regard to her left lower extremity. I am very pleased in this regard. In regard to her right leg this actually did start draining more I think it is mainly due to the fact that her leg is more swollen. I am not seeing any obvious signs of infection at this time although that is definitely something were obviously acutely aware of simply due to the fact that she had an issue not too far back with exactly this issue. Nonetheless I do feel like that lymphedema clinic would still be beneficial for her. I explained obviously if they are not able to do anything treatment wise on the right leg we could at least have them treat her left leg and then proceed from there. The patient is really in agreement with that plan. If they are able to do both as the drainage slows down that I would be happy to let them handle both. ZEENAT, JEANBAPTISTE ([id number]) [id number] Page 3 of 13 06/01/2019 on evaluation today patient unfortunately appears to be doing worse with regard to her right lower extremity. The left lower extremity is still maintaining at this point. Unfortunately she has been having significantly increased pain over the past several days and has been experiencing as  well increased swelling of the right lower extremity. I really do not know that I am seeing anything that appears to be obvious for infection at this point to be peripherally honest. With that being said the patient does seem to be having much more swelling that she is even experienced in the past and coupled with increased pain in her hip as well I am  concerned that again she could potentially have a DVT although I am not 100% sure of this. I think it something that may need to be checked out. We discussed the possibility of sending her for a DVT study through the hospital but unfortunately transportation is an issue if she does have a DVT I do not want her to wait days to be able to get in for that test however if she has this scheduled as an outpatient that is as fast that she will be able to get the test scheduled for transportation purposes. That will also fall on Thanksgiving so subsequently she did actually be looking at either Friday or even next week before we would know anything back from this. That is much too long in my opinion. Subsequent to the amount of discomfort she is experiencing the patient is actually okay with going to the ER for evaluation today. 06/12/2019 on evaluation today patient actually appears to be doing significantly better compared to last time I saw her. Following when I last saw her she was actually in the hospital from that Monday until the following Sunday almost 1 full week. She actually was placed on Keflex in the hospital following the time for her to be discharged and Dr. Steva Ready has recommended 2 times a day dosing of the Keflex for the next year in order to help with more prophylactic/preventative measures with regard to her developing cellulitis. Overall I think this sounds like an excellent plan. The patient unfortunately is good to have trouble being treated at lymphedema clinic due to the fact that she really cannot get up on the bed that they have  there. They also state that they cannot manage her as long as she has anything draining at this point. Obviously that is somewhat unfortunate as she does need help with edema control but nonetheless we will have to do what we can for her outside of it sounds like the lymphedema clinic scenario at this point. 06/19/2019 on evaluation today patient appears to be doing fairly well with regard to her bilateral lower extremities. She is not nearly as swollen and shows no signs of infection at this point. There is no evidence of cellulitis whatsoever. She also has no open wounds or draining at this point which is also good news. No fever chills noted. She seems to be in very good spirits and in fact appears to be doing quite well. READMISSION 11/27/2019 This is a 66 year old woman that we have had in this clinic several times before including 2015, 16 and 19 and then most recently from 03/20/2019 through 06/19/2019 with bilateral lower extremity lymphedema. She has had previous arterial and reflux studies done years ago which were not all that remarkable. 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 She does have a positive family history and she had large legs starting may be in her 62s. She was recently in hospital from 10/20/2019 through 10/28/2019 with right leg cellulitis. She was given Ancef and clindamycin and then Zosyn when a culture showed Pseudomonas. At that time there was purulent drainage. She was followed by infectious disease Dr Steva Ready. The patient is now back at home. She has noted increased swelling in the right and no drainage in her right leg mostly on the posterior medial aspect in the calf area. She has not had pain or fever. She has literally been improved lysing above dressings because her at the area of this is far  too large for standard compression. She has been wrapping the areas with sheets to resorptive pads. She is found these  helped somewhat. She does have an appointment with the lymphedema clinic in [place] in late June. Past medical history includes bilateral lymphedema, hypertension, obstructive sleep apnea with CPAP. Recent hospitalization with apparently Pseudomonas cellulitis of the right lower leg 12/15/2019 upon evaluation today patient appears to be doing a little bit worse in regard to her right lower extremity. Unfortunately she is having more weeping down in the lower portion of her leg. Fortunately there is no signs of active infection at this time. No fever chills noted. The patient states she is not having increased pain except for when she attempted to use the lymphedema pumps unfortunately she states that she did have pain when she did this. Otherwise we been using absorptive dressings of one type or another she is using diapers at home and then subsequently Ace wraps. In regard to the barrier cream we have discussed the possibility of derma cloud which she would like to try I do not have a problem with that. 12/22/2019 upon evaluation today patient actually appears to be doing better in regard to her leg ulcers at this point. Fortunately there does not appear to be any signs of active infection which is great news and I am extremely pleased with where things are progressing at this time. There is no sign of active infection currently. The patient is very pleased to see things doing so well. 12/29/2019 upon evaluation today patient appears to be doing a little bit better in regard to her weeping in general over her lower extremities. She does have some signs of mild erythema little bit more than what I noted last week or rather last visit. Nonetheless I think that my threshold for switching her antibiotics from Keflex to something else is very low at this point considering that she has had such severe infections in the past that seem to come almost out of nowhere. There is a little erythema and warmth  noted of the lower portion of her leg compared to the upper which also makes me want to go ahead and address things more rapidly at this point. Likely I would switch out the Keflex for something like Levaquin ideally. 7/16; patient with severe bilateral lymphedema. She has superficial wounds albeit almost circumferential now on the left lateral lower leg. This may be new from last time. Small area on the right anterior lower leg and then another area on the right medial lower leg and of pannus fold. She has been using various absorptive garments. She states she is using her compression pumps once a day occasionally twice. Culture from her last visit here was negative 01/29/2020 on evaluation today patient appears to be doing excellent at this point in regard to her legs with regard to infection I see no signs of active infection at this point. She still does have unfortunately areas of weeping this is minimal on the right now her left is actually significantly worse although I do not think it is as bad as last week with Dr. Dellia Nims saw her. She has been trying to pump and elevate her legs is much as possible. She has previously been on the Keflex and in the past for prevention that seems to do fairly well and likely can extend that today. 02/04/2020 on evaluation today patient appears to be doing better in regard to her legs bilaterally. Fortunately there is no signs of  active infection at this time which is great news and overall she has less weeping on the left compared to the right and there is several spots where she is pretty much sealed up with no draining regions. Overall very pleased in this regard. 02/19/2020 on evaluation today patient appears to be doing very well in regard to her wounds currently. Fortunately there is no evidence of active infection overall very pleased with where things stand. She is significantly improved in regard to her edema I am extremely pleased in this regard she tells  me that the popping no longer hurts and in fact she actually looks forward to it. 03/04/2020 on evaluation today patient appears to be doing excellent in regard to her lower extremities. Fortunately there is no signs of active infection at this time. No fevers, chills, nausea, vomiting, or diarrhea. 03/25/2020 on evaluation today patient appears to be doing a little bit more poorly in regard to her legs at this point. She tells me that she is still continue to have issues with drainage and this has been a little bit worse she was getting ready to start taking the Keflex again but wanted to see me first. Fortunately there is no signs of active infection at this time. No fevers, chills, nausea, vomiting, or diarrhea. 04/15/2020 upon evaluation today patient appears to be doing somewhat poorly in regard to her right leg. She tells me she has been having more pain she has been taking the Keflex that was previously prescribed unfortunately that just does not seem to help with this. She was hoping that the pain on her right was actually coming from the fact that she was having issues with her wrap having gotten caught in her recliner. With that being said she tells me that she knew something was not right. Currently her right leg is warm to touch along with being erythematous all the way up to around at least mid thigh as far as I can see. The left leg does not appear to be doing that badly though there is increased weeping around the ankle region. 05/06/2020 on evaluation today patient appears to be doing much better than last time I saw her. She did go to the hospital where she was admitted for 2 days and treated with antibiotic therapy. She was discharged with antibiotics as well and has done extremely well. I am extremely pleased with where things stand today. There is no signs of active infection at this time which is great news. 05/27/2020 upon evaluation today patient appears to be doing well with  regard to her lower extremities bilaterally. She has just a very tiny area on the right leg which is opening on the left leg she is significantly improved though she still has several areas that do appear to be open this is minimal compared to what is been in the past. In general I am extremely pleased with where things stand today. The patient does tell me she is not been using her pumps quite as much as PAXTYN, WISDOM ([id number]) [id number] Page 4 of 13 she should be. I do believe that is 1 area she can definitely work on. She has had a lot going on including a Covid exposure and apparently also a outbreak of likely shingles. 06/24/2020 upon evaluation today patient appears to be doing well with regard to her legs in general although the left leg unfortunately is showing some signs of erythema she does have a little bit of increased weeping and  to be honest I am concerned about infection here. I discussed that with her today and I think that we may need to address this sooner rather than later she has been taking Keflex she is not really certain that is been making a big improvement however. No fevers, chills, nausea, vomiting, or diarrhea. 06/30/2020 upon evaluation today patient's legs actually seem to be doing better in my opinion as compared to where they were last week. Fortunately there does not appear to be any signs of active infection. Her culture showed multiple organisms nothing predominate. With that being said the Levaquin seems to have done well I think she has improved since I last saw her as well. 07/14/2020 upon evaluation today patient appears to be doing actually better in regard to her lower extremities in my opinion. She has been tolerating the dressing changes without complication. Fortunately there is no signs of active infection at this time. 08/04/2020 upon evaluation today patient appears to be doing excellent in regard to her leg ulcers.  Fortunately she has very little that is open at this point. This is great news. In fact I think that the Goldbond medicated powder has been excellent for her. It seems to have done the trick where we had tried several other things without as much success. Fortunately there is no evidence of active infection at this time. No fevers, chills, nausea, vomiting, or diarrhea. 09/01/2020 upon evaluation today patient appears to be doing a little bit more poorly than the last time I saw her. She tells me right now that she has been having a lot of drainage compared to where things were previous which has unfortunately led to more irritation as well. She is concerned that this is leading to infection. Again based on what I am seeing today as well I am also concerned of the same to be honest. 09/15/2020 upon evaluation today patient appears to be doing well with regard to her legs compared to what I saw previous. Fortunately there does not appear to be any evidence of active infection at this time which is great news. At least not as badly as what it was previous. With that being said I do believe that the patient does need to have an extension of the antibiotics. This I believe will actually help her more in the way of making sure this stays under control and does not worsen. That was the Bactrim DS. Readmission: 08/01/2021 upon evaluation today patient appears to be doing actually pretty well all things considered. Is actually been almost a year since have seen her last March. Fortunately I do not see any evidence of active infection locally nor systemically at this point. She does have a couple open areas on the left leg the right leg appears to be doing quite well. In general she has been in the hospital 3 times since I saw her a year ago all 4 cellulitis type issues except for the last 1 which was actually more related to congestive heart failure for that reason she is not using her lymphedema pumps at this  point and that is probably the best idea. 08/08/2021 upon evaluation today patient appears to be doing well with regard to her legs all things considered her left leg may be a little bit more swollen based on what I see today. Fortunately I do not see any signs of active infection locally nor systemically at this time which is great news. No fevers, chills, nausea, vomiting, or diarrhea. 08/15/2021 upon  evaluation today patient actually appears to be doing excellent in regard to her wounds. She has been tolerating the dressing changes without complication. Fortunately I see no evidence of infection currently which is great news. No fevers, chills, nausea, vomiting, or diarrhea. 08/29/2021 upon evaluation patient appears to be doing excellent she in fact is almost completely healed. I am actually very pleased with where we stand today and I think that she is making wonderful progress she just has a small area of weeping on the right lateral leg everything else is pretty much completely healed which is also. 09/05/2021 upon evaluation today patient appears to be doing well with regard to her legs. She does have a small area of weeping on the right leg and there is a small area on the left leg as well. It is potentially something that may need to be addressed in the future more specifically but right now I think that there may have just been a little drainage here on the left. With all that being said I think that this still does not appear to be infected and overall I feel like she is doing quite well. That something we always have to keep a very close eye on as she can change very rapidly from okay to not okay. 09/12/2021 upon evaluation today patient appears to be doing a little bit worse in regard to her leg and actually somewhat concerned about the possibility of infection here. I discussed that with the patient. For that reason I am going to go ahead and see about getting her set up for a repeat  prescription for the Bactrim. She is in agreement with that plan. 3/14; patient presents for follow-up. She has been using silver alginate with dressing changes. She is still taking Bactrim prescribed at last clinic visit. She has no issues or complaints today. She has lymphedema pumps however does not use these. 09/26/2020 upon evaluation today patient appears to actually be doing well in regard to her right leg I am pleased in that regard. Unfortunately she has new areas on her left leg that are open at this point that I do need to be addressed. I am also concerned about the warmth around one of the wounds on the more anterior side. I think this is something that we may need to culture today potentially requiring antibiotics going forward. 10/03/2021 upon evaluation today patient unfortunately is not doing nearly as well as she was even last week with her wounds. She is having some issues here with increased cellulitis of the left lower extremity unfortunately. I did prescribe Augmentin for her eczema prescribed last week unfortunately she never actually received this prescription. The pharmacy stated they never got it. We double checked with them today they still did not receive it. I am not sure what happened in that regard. With that being said the patient is in good spirits today she does not appear to be as sick as where she normally is when the cellulitis gets significantly worse as it has in the past. Nonetheless the leg is much more red not just around the ankle where I was seeing at last week on Tuesday but rather now this is going all the way up to almost her knee and is definitely hot to touch compared to the right leg. Nonetheless I feel like she does need to go to the ER for likely IV antibiotics. 10-10-2021 upon evaluation today patient appears to be doing well with regard to her wound. She  has been tolerating the dressing changes that appears to be doing much better. After I saw her last  week she did go to the ER they admitted her to the hospital and gave her IV antibiotics she fortunately is doing significantly better. Overall I am extremely pleased with where things stand today. 10-17-2021 upon evaluation today patient appears to be doing well with regard to her wound. In fact this is looking better and her leg is looking better but she still does have some areas here of erythema. We will continue to address this as soon as possible in my opinion. I do believe she may require some debridement and what appears to be a more defined wound on her left leg at this point. 10-24-2021 upon evaluation patient is definitely showing signs of improvement which is great news. I do not see any evidence of active infection locally or systemically which is great news as well. No fevers, chills, nausea, vomiting, or diarrhea. 10-31-2021. Upon evaluation today patient's leg actually feels better from an infection and wound care perspective. I am actually very pleased in this regard. Unfortunately the biggest issue that I see at this time is that the patient is having a significant issue with her breathing. I did put her on the pulse ox machine to check her oxygen saturation and it was pretty much at 100% which is good but she tells me that when she is walking this is much more difficult for her. She also tells me that when she is trying to bend over to perform her dressing changes she is so short of breath due to the swelling and fluid in her abdominal area that she is just not been able to do this. She is tearful and very concerned today to be honest. I am truly sorry to see her this way I know she is really having a hard time at the moment. 11-14-2021 upon evaluation patient actually appears to be doing significantly better compared to when I last saw her. She was actually admitted to the hospital I believe this for 6 days total after I sent her on 10-31-2021. Subsequently they actually pulled off greater  than 50 pounds of fluid she tells me she feels significantly better her legs are not as tight she actually has some play in the tissue and it is obvious that she is doing much better just from a mobility LUS, KRIEGEL ([id number]) [id number] Page 5 of 13 standpoint as well as a breathing standpoint. Overall I am extremely happy for her and how she is doing the leg also looks much better on the left. 11-21-2021 upon evaluation today patient appears to be doing well although it does appear that she is going require some sharp debridement the wound is very dry this is the opposite of what its been she was having so much weeping that it was staying extremely wet. Nonetheless we do need to see about going ahead and debriding the wound today. 11-28-2021 upon evaluation today patient appears to be doing well with regard to her wound I definitely see signs of things continuing to improve which is great news. I do not see any evidence of active infection locally or systemically also great news. 12-05-2021 upon evaluation today patient appears to be doing well with regard to her wound. She has been tolerating the dressing changes. Fortunately there does not appear to be any signs of active infection locally or systemically at this time. No fevers, chills, nausea, vomiting, or diarrhea. 12-12-2021  upon evaluation patient appears to be doing well with regard to her wound this is actually measuring smaller and looking much better. Fortunately I do not see any signs of active infection locally or systemically at this time which is excellent news. 6/13; small wound area in the folds of her lymphedema just above the left ankle. Fortunately the area looks quite good. She has been using silver alginate ABDs and an Ace wrap which she is able to change her self. She is not using her compression pumps out of fear that this could contribute to heart failure. 12-26-2021 upon evaluation patient's  wounds are actually showing signs of excellent improvement. I am very pleased with where things stand. Overall I think that she has been making excellent progress and I think we are very close to complete resolution which is great news. 01-11-2022 upon evaluation today patient appears to be doing well currently in regard to her legs in general although on the left leg she does have 1 area that still irritated and inflamed on the anterior portion of her leg. Fortunately I do not see any signs of systemic infection locally there might be some infection going on here however which is my biggest concern. Fortunately I do not see any evidence though of this spreading to any other location. 01-16-2022 upon evaluation today patient has actually showing signs of excellent improvement. Fortunately I do not see any evidence of infection locally or systemically which is great news and overall I am very pleased with where we stand at this point. 01-25-2022 upon evaluation today patient actually appears to be doing decently well in regard to her legs. She has been tolerating the dressing changes without complication. Fortunately there does not appear to be any evidence of active infection locally or systemically which is great news and overall I am extremely pleased with where we stand at this point. 02-13-2022 upon evaluation today patient appears to be doing well currently in regard to her wound which is actually showing signs of significant improvement. Fortunately I do not see any evidence of active infection locally or systemically at this time. No fevers, chills, nausea, vomiting, or diarrhea. 02-20-2022 upon evaluation today patient appears to be doing well currently in regard to her wound. She has been tolerating the dressing changes without complication. Fortunately there does not appear to be any signs of active infection locally or systemically at this time. No fevers, chills, nausea, vomiting,  or diarrhea. 02-27-2022 upon evaluation today patient appears to be doing excellent in regard to her wounds on the leg. She has been tolerating the dressing changes without complication this is very close to complete resolution. Fortunately I do not see any signs of infection locally or systemically at this time which is great news. No fevers, chills, nausea, vomiting, or diarrhea. 03-06-2022 upon evaluation today patient appears to be doing well currently in regard to her wound in general. She has been tolerating the dressing changes without complication. Fortunately I do not see any evidence of active infection locally or systemically at this time which is great news. I am concerned a little bit however about not really her wounds but the second toe right foot where there appears to be some cellulitis after she dropped a frying pan on this about 2 weeks ago. She tells me its been itching and burning and I do think that it is possible she may have an infection based on what we are seeing currently. 03-20-2022 upon evaluation today patient actually appears  to be doing much better at this point. Fortunately there does not appear to be any signs of active infection locally or systemically at this time which is great news. No fevers, chills, nausea, vomiting, or diarrhea. 03-27-2022 upon evaluation today patient appears to be doing somewhat poorly in regard to her legs compared to previous. Her right leg has a couple areas that are open and she is very warm and painful to touch around the lateral portion of the ankle area. Left leg is showing signs of little bit more drainage and weeping as well I think that this is probably due to the fact that she is swelling a lot more that she has been in the past. Fortunately I do not see any signs of active infection systemically though locally there is definitely some issues here. 04-03-2022 upon evaluation today patient appears to be doing poorly in regard to her  wounds on the legs unfortunately. She also seems to have more significant cellulitis that has ensued. Unfortunately she has been on doxycycline initially it seemed to be helping but as of this week that is no longer the case. It started getting worse last week and with the reinitiation of the doxycycline nothing has improved. Often when she gets like this IV antibiotics are necessary. 04-19-2022 upon evaluation today patient appears to be doing well currently in regard to her legs from an infection standpoint. She did get IV vancomycin in the hospital and states that her legs did get better when she left she was no longer weeping but she had been in the bed for 4 days straight. With that being said since coming home her legs have begun to weep again the original wound that we were dealing with has healed she does have some weeping over both lower extremities however which is still of concern. 05-03-2022 upon evaluation today patient appears to be doing well currently in regard to her left leg which I feel like is a little bit better unfortunately the right leg is still draining significantly. Fortunately I do not see any signs of infection locally or systemically at this time. No fevers, chills, nausea, vomiting, or diarrhea. Electronic Signature(s) Signed: 05/03/2022 11:55:06 AM By: Worthy Keeler PA-C Entered By: Worthy Keeler on 05/03/2022 11:55:06 [name] ([id number]) [id number] Page 6 of 13 -------------------------------------------------------------------------------- Physical Exam Details Patient Name: Date of Service: [name], [name] 05/03/2022 11:15 A M Medical Record Number: [id number] Patient Account Number: [id number] Date of Birth/Sex: Treating RN: [birth year] (66 y.o. Marlowe [place]ores Primary Care Provider: Ranae Plumber Other Clinician: Massie Kluver Referring Provider: Treating Provider/Extender: Skeet Simmer in  Treatment: 39 Constitutional Obese and well-hydrated in no acute distress. Respiratory normal breathing without difficulty. Psychiatric this patient is able to make decisions and demonstrates good insight into disease process. Alert and Oriented x 3. pleasant and cooperative. Notes Upon inspection patient's wounds again do not appear to be infected but she is having a tremendous amount of drainage at this time which is unfortunate. I do believe that she should continue to monitor for any signs of infection such as increased discomfort or pain if that happens she knows to go straight to the ER. In the meantime she is on the Keflex which I prescribed for her as well. Electronic Signature(s) Signed: 05/03/2022 11:55:31 AM By: Worthy Keeler PA-C Entered By: Worthy Keeler on 05/03/2022 11:55:31 -------------------------------------------------------------------------------- Physician Orders Details Patient Name: Date of Service: [name] 05/03/2022 11:15 A  M Medical Record Number: [id number] Patient Account Number: [id number] Date of Birth/Sex: Treating RN: 07-Sep-1955 (66 y.o. 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 Nurse Visit as needed JOVONNA, NICKELL ([id number]) [id number] Page 7 of 13 Bathing/ Shower/ Hygiene Clean wound with Normal Saline or wound cleanser. Wash wounds with antibacterial soap and water. May shower; gently cleanse wound with antibacterial  soap, rinse and pat dry prior to dressing wounds No tub bath. Edema Control - Lymphedema / Segmental Compressive Device / Other A wraps ce Elevate, Exercise Daily and A void Standing for Long Periods of Time. Elevate leg(s) parallel to the floor when sitting. DO YOUR BEST to sleep in the bed at night. DO NOT sleep in your recliner. Long hours of sitting in a recliner leads to swelling of the legs and/or potential wounds on your backside. Other: - use lymphedema pumps Non-Wound Condition Bilateral Lower Extremities Cleanse affected area with antibacterial soap and water, pply appropriate compression. - use abdominal pads, and chuck pads, them ace wrap to secure A dditional non-wound orders/instructions: - silver cell to open weeping areas A Medications-Please add to medication list. ntibiotics - continue cephalexin as directed P.O. A Electronic Signature(s) Signed: 05/03/2022 5:00:04 PM By: Worthy Keeler PA-C Signed: 05/07/2022 5:32:10 PM By: Massie Kluver Entered By: Massie Kluver on 05/03/2022 11:56:13 -------------------------------------------------------------------------------- Problem List Details Patient Name: Date of Service: RUDOLPH, DOBLER 05/03/2022 11:15 A M Medical Record Number: [id number] Patient Account Number: [id number] Date of Birth/Sex: Treating RN: 1955/11/23 (67 y.o. 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 08/01/2021 No Yes of skin L97.821 Non-pressure chronic ulcer of other part of left lower leg limited to breakdown 08/01/2021 No Yes of skin Inactive Problems Resolved Problems Electronic  Signature(s) KATHLEN, SAKURAI ([id number]) [id number] Page 8 of 13 Signed: 05/03/2022 11:04:05 AM By: Worthy Keeler PA-C Entered By: Worthy Keeler on 05/03/2022 11:04:05 -------------------------------------------------------------------------------- Progress Note Details Patient Name: Date of Service: KEANDREA, TAPLEY 05/03/2022 11:15 A M Medical Record Number: [id number] Patient Account Number: [id number] Date of Birth/Sex: Treating RN: 13-Aug-1955 (66 y.o. [place] Primary Care Provider: Ranae Plumber Other Clinician: Massie Kluver Referring Provider: Treating Provider/Extender: Skeet Simmer in Treatment: 39 Subjective Chief Complaint Information obtained from Patient Bilateral LE lymphedema with Left LE Ulcers History of Present Illness (HPI) The patient is a 66 year old female with history of hypertension and a long-standing history of bilateral lower extremity lymphedema (first presented on 4/2) . She has had open ulcers in the past which have always responded to compression therapy. She had briefly been to a lymphedema clinic in the past which helped her at the time. this time around she stopped treatment of her lymphedema pumps approximately 2 weeks ago because of some pain in the knees and then noticed the right leg getting  worse. She was seen by her PCP who put her on clindamycin 4 times a day 2 days ago. The patient has seen [facility] and Dr. Delana Meyer had seen her last year where a vascular study including venous and arterial duplex studies were within normal limits. he had recommended compression stockings and lymphedema pumps and the patient has been using this in about 2 weeks ago. She is known to be diabetic but in the past few time she's gone to her primary care doctor her hemoglobin A1c has been normal. 02/11/2015 - after her last visit she took my advice and went to the ER regarding the progressive cellulitis of her  right lower extremity and she was admitted between July 17 and 22nd. She received IV antibiotics and then was sent home on a course of steroid-induced and oral antibiotics. She has improved much since then. 02/17/2015 -- she has been doing fine and the weeping of her legs has remarkably gone down. She has no fresh issues. READMISSION 01/15/18 This patient was given this clinic before most recently in 2016 seen by Dr. Con Memos. She has massive bilateral lymphedema and over the last 2 months this had weeping edema out of the left leg. She has compression pumps but her compliance with these has been minimal. She has [facility] they've been using TCA/ABDs/kerlix under an Ace wrap.she has had recent problems with cellulitis. She was apparently seen in the ER and 12/23/17 and given clindamycin. She was then followed by her primary doctor and given doxycycline and Keflex. The pain seems to have settled down. In April 2018 the patient had arterial studies done at [facility]. This showed triphasic waveforms throughout the right leg and mostly triphasic waveforms on the left except for monophasic at the posterior tibial artery distally. She was not felt to have evidence of right lower extremity arterial stenosis or significant problems on the left side. 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 She also had a right lower extremity venous Doppler in January 2018 this was limited by the patient's body habitus and lymphedema. Most of the proximal veins were not visualized The patient presents with an area of denuded skin on the anterior medial part of the left calf. There is weeping edema fluid here. 01/22/18; the patient has somewhat better edema control using her compression pumps twice a day and as a result she has much better epithelialization on the left anterior calf area. Only a small open area remains. 01/29/18; the patient has been compliant with her  compression pumps. Both the areas on her calf that healed. The remaining area on the left anterior leg is fully epithelialized Readmission: 02/20/2019 upon evaluation today patient presents for reevaluation due to issues that she is having with the bilateral lower extremities. She actually has wounds open on both legs. On the right she has an area in the crease of her leg on the right around the knee region which is actually draining quite a bit and actually has some fungal type appearance to it. She has been on nystatin powder that seems to have helped to some degree. In regard to the left lower extremity this is actually in the lower portion of her leg closer to the ankle and again is continuing to drain as well unfortunately. There does not appear to be any signs of active infection at this time which is good news. No fevers, chills, nausea, vomiting, or diarrhea. She tells me that since she was seen  last year she is actually been doing quite well for the most part with regard to her lower extremities. Unfortunately she now is experiencing a little bit more drainage at this time. She is concerned about getting this under control so that it does not get significantly worse. 02/27/2019 on evaluation today patient appears to be doing somewhat better in regard to her bilateral lower extremity wounds. She has been tolerating the dressing changes without complication. Fortunately there is no signs of active infection at this point. No fevers, chills, nausea, vomiting, or diarrhea. She did get her dressing supplies which is excellent news she was extremely excited to get these. She also got paperwork from [employer] for their financial assistance program where they may be able to help her out in the future if needed with supplies at discounted prices. 03/06/2019 on evaluation today patient appears to be doing a little worse with regard to both areas of weeping on her bilateral lower extremities. This is  around the right medial knee and just above the left ankle. With that being said she is unfortunately not doing as well as I would like to see. I feel like she may need to TURA, ROLLER ([id number]) [id number] Page 9 of 13 potentially go see someone at the lymphedema clinic as the wraps that she needs or even beyond what we can do here at the wound care center. She really does not have wounds she just has open areas of weeping that are causing some difficulty for her. Subsequently because of this and the moisture I am concerned about the potential for infection I am going to likely give her a prophylactic antibiotic today, Keflex, just to be on the safe side. Nonetheless again there is no obvious signs of active infection at this time. 03/13/2019 on evaluation today patient appears to be doing well with regard to her bilateral lower extremities where she has been weeping compared to even last week's evaluation. I see some areas of new skin growth which is excellent and overall I am very pleased with how things seem to be progressing. No fevers, chills, nausea, vomiting, or diarrhea. 03/20/2019 on evaluation today patient unfortunately is continuing to have issues with significant edema of the left lower extremity. Her right side seems to be doing much better. Unfortunately her left side is showing increased weeping of the lower portion of her leg. This is quite unfortunate obviously we were hoping to get her into the lymphedema clinic they really do not seem to when I see her how if she is draining. Despite the fact this is really not wound related but more lymphedema weeping related. Nonetheless I do not know that this can be helpful for her to even go for that appointment since again I am not sure there is much that they would actually do at this point. We may need to try a 4 layer compression wrap as best we can on her leg. She is on the Augmentin currently although I am  still concerned about whether or not there could be potentially something going on infection wise I would obtain a culture though I understand is not the best being that is a surface culture I just 1 to make sure I do not seem to be missing anything. 03/27/2019 on evaluation today patient appears to be doing much better in regard to the left lower extremity compared to last week. Last week she had tremendous weeping which I think was subsequent to infection now she seems to  be doing much better and very pleased. This is not completely healed but there is a lot of new skin growth and it has dried out quite a bit. Overall I think that we are doing well with how things are moving along at this time. No fevers, chills, nausea, vomiting, or diarrhea. 04/03/2019 evaluation today patient appears to be doing a little worse this week compared to last time I saw her. I think this may be due to the fact that she is having issues with not being able to sleep in her bed at least not until last night. She is therefore been in a lift chair and subsequently has also had issues with not been able to use her pumps since she could not get in bed. With that being said the patient overall seems to be doing okay I do think I may want extend the antibiotic for a little bit longer at least until we can see if her edema and her weeping gets better and if it is then obviously I can always discontinue the antibiotics as of next week however I want her to continue to have it over the next week. 04/10/2019 on evaluation today patient unfortunately is still doing poorly with regard to her left lower extremity. Her right is all things considering doing fairly well. On the left however she continues to have spreading of the area of infection and weeping which appears to be even a larger surface area than noted last week. She did have a positive culture for Pseudomonas in particular which seems to have been of concern she still has  green/yellow discharge consistent with Pseudomonas and subsequently a tremendous amount of it. This has me obviously still concerned about the infection not really clearing up despite the fact that on culture it appears the Cipro should have been a good option for treating this. I think she may at this point need IV antibiotics since things are not doing better I do not want to get worse and cause sepsis. She is in agreement with the plan and believes as well that she likely does need to go to the hospital for IV vancomycin. Or something of the like depending on what the recommendation is from the ER. 04/17/2019 on evaluation today patient appears to be doing excellent in regard to her lower extremity on the left. She was in the hospital for several days from when I sent her last we saw her until just this past Tuesday. Fortunately her drainage is significantly improved and in fact is mostly clear. There is just a couple small areas that may still drain a little bit she states that the [facility] they prescribed for her at discharge she went picked up from pharmacy and got home but has not been able to find it since. She is looked everywhere. She is wondering if I will replace that for her today I will be more than happy to do that. 05/01/2019 on evaluation today patient actually appears to be doing quite well with regard to her lower extremities. She occasionally is having areas that will leak and then heal up mainly when a piece of the fibrotic skin pops off but fortunately she is not having any signs of active infection at this time. Overall she also really does not have any obvious weeping at this time. I do believe however she really needs some compression wraps and I think this may be a good time to get her back to the lymphedema clinic. 05/11/2019 on  evaluation today patient actually appears to be doing quite well with regard to her bilateral lower extremities. She occasionally will have a small area  that we per another but in general seems to be completely healed which is great news. Overall very pleased with how everything seems to be progressing. She does have her appointment with lymphedema clinic on November 18. 05/25/2019 on evaluation today patient appears to be doing well with regard to her left lower extremity. I am very pleased in this regard. In regard to her right leg this actually did start draining more I think it is mainly due to the fact that her leg is more swollen. I am not seeing any obvious signs of infection at this time although that is definitely something were obviously acutely aware of simply due to the fact that she had an issue not too far back with exactly this issue. Nonetheless I do feel like that lymphedema clinic would still be beneficial for her. I explained obviously if they are not able to do anything treatment wise on the right leg we could at least have them treat her left leg and then proceed from there. The patient is really in agreement with that plan. If they are able to do both as the drainage slows down that I would be happy to let them handle both. 06/01/2019 on evaluation today patient unfortunately appears to be doing worse with regard to her right lower extremity. The left lower extremity is still maintaining at this point. Unfortunately she has been having significantly increased pain over the past several days and has been experiencing as well increased swelling of the right lower extremity. I really do not know that I am seeing anything that appears to be obvious for infection at this point to be peripherally honest. With that being said the patient does seem to be having much more swelling that she is even experienced in the past and coupled with increased pain in her hip as well I am concerned that again she could potentially have a DVT although I am not 100% sure of this. I think it something that may need to be checked out. We discussed the  possibility of sending her for a DVT study through the hospital but unfortunately transportation is an issue if she does have a DVT I do not want her to wait days to be able to get in for that test however if she has this scheduled as an outpatient that is as fast that she will be able to get the test scheduled for transportation purposes. That will also fall on Thanksgiving so subsequently she did actually be looking at either Friday or even next week before we would know anything back from this. That is much too long in my opinion. Subsequent to the amount of discomfort she is experiencing the patient is actually okay with going to the ER for evaluation today. 06/12/2019 on evaluation today patient actually appears to be doing significantly better compared to last time I saw her. Following when I last saw her she was actually in the hospital from that Monday until the following Sunday almost 1 full week. She actually was placed on Keflex in the hospital following the time for her to be discharged and Dr. Steva Ready has recommended 2 times a day dosing of the Keflex for the next year in order to help with more prophylactic/preventative measures with regard to her developing cellulitis. Overall I think this sounds like an excellent plan. The patient unfortunately  is good to have trouble being treated at lymphedema clinic due to the fact that she really cannot get up on the bed that they have there. They also state that they cannot manage her as long as she has anything draining at this point. Obviously that is somewhat unfortunate as she does need help with edema control but nonetheless we will have to do what we can for her outside of it sounds like the lymphedema clinic scenario at this point. 06/19/2019 on evaluation today patient appears to be doing fairly well with regard to her bilateral lower extremities. She is not nearly as swollen and shows no signs of infection at this point. There is no  evidence of cellulitis whatsoever. She also has no open wounds or draining at this point which is also good news. No fever chills noted. She seems to be in very good spirits and in fact appears to be doing quite well. READMISSION 11/27/2019 This is a 66 year old woman that we have had in this clinic several times before including 2015, 16 and 19 and then most recently from 03/20/2019 through 06/19/2019 with bilateral lower extremity lymphedema. She has had previous arterial and reflux studies done years ago which were not all that remarkable. 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 She does have a positive family history and she had large legs starting may be in her 39s. She was recently in hospital from 10/20/2019 through 10/28/2019 with right leg cellulitis. She was given Ancef and clindamycin and then Zosyn when a culture showed Pseudomonas. At that time there was purulent drainage. She was followed by infectious disease Dr Steva Ready. The patient is now back at home. She has noted increased swelling in the right and no drainage in her right leg mostly on the posterior medial aspect in the calf area. She has not had pain or fever. She has literally been improved lysing above dressings because her at the area of this is far too large for standard compression. She has been wrapping the areas with sheets to resorptive pads. She is found these helped somewhat. She does have an appointment with the TASHIBA, TIMONEY ([id number]) [id number] Page 10 of 13 lymphedema clinic in [place] in late June. Past medical history includes bilateral lymphedema, hypertension, obstructive sleep apnea with CPAP. Recent hospitalization with apparently Pseudomonas cellulitis of the right lower leg 12/15/2019 upon evaluation today patient appears to be doing a little bit worse in regard to her right lower extremity. Unfortunately she is  having more weeping down in the lower portion of her leg. Fortunately there is no signs of active infection at this time. No fever chills noted. The patient states she is not having increased pain except for when she attempted to use the lymphedema pumps unfortunately she states that she did have pain when she did this. Otherwise we been using absorptive dressings of one type or another she is using diapers at home and then subsequently Ace wraps. In regard to the barrier cream we have discussed the possibility of derma cloud which she would like to try I do not have a problem with that. 12/22/2019 upon evaluation today patient actually appears to be doing better in regard to her leg ulcers at this point. Fortunately there does not appear to be any signs of active infection which is great news and I am extremely pleased with where things are progressing at this time. There is no sign of active infection currently.  The patient is very pleased to see things doing so well. 12/29/2019 upon evaluation today patient appears to be doing a little bit better in regard to her weeping in general over her lower extremities. She does have some signs of mild erythema little bit more than what I noted last week or rather last visit. Nonetheless I think that my threshold for switching her antibiotics from Keflex to something else is very low at this point considering that she has had such severe infections in the past that seem to come almost out of nowhere. There is a little erythema and warmth noted of the lower portion of her leg compared to the upper which also makes me want to go ahead and address things more rapidly at this point. Likely I would switch out the Keflex for something like Levaquin ideally. 7/16; patient with severe bilateral lymphedema. She has superficial wounds albeit almost circumferential now on the left lateral lower leg. This may be new from last time. Small area on the right anterior lower leg  and then another area on the right medial lower leg and of pannus fold. She has been using various absorptive garments. She states she is using her compression pumps once a day occasionally twice. Culture from her last visit here was negative 01/29/2020 on evaluation today patient appears to be doing excellent at this point in regard to her legs with regard to infection I see no signs of active infection at this point. She still does have unfortunately areas of weeping this is minimal on the right now her left is actually significantly worse although I do not think it is as bad as last week with Dr. Dellia Nims saw her. She has been trying to pump and elevate her legs is much as possible. She has previously been on the Keflex and in the past for prevention that seems to do fairly well and likely can extend that today. 02/04/2020 on evaluation today patient appears to be doing better in regard to her legs bilaterally. Fortunately there is no signs of active infection at this time which is great news and overall she has less weeping on the left compared to the right and there is several spots where she is pretty much sealed up with no draining regions. Overall very pleased in this regard. 02/19/2020 on evaluation today patient appears to be doing very well in regard to her wounds currently. Fortunately there is no evidence of active infection overall very pleased with where things stand. She is significantly improved in regard to her edema I am extremely pleased in this regard she tells me that the popping no longer hurts and in fact she actually looks forward to it. 03/04/2020 on evaluation today patient appears to be doing excellent in regard to her lower extremities. Fortunately there is no signs of active infection at this time. No fevers, chills, nausea, vomiting, or diarrhea. 03/25/2020 on evaluation today patient appears to be doing a little bit more poorly in regard to her legs at this point. She tells me  that she is still continue to have issues with drainage and this has been a little bit worse she was getting ready to start taking the Keflex again but wanted to see me first. Fortunately there is no signs of active infection at this time. No fevers, chills, nausea, vomiting, or diarrhea. 04/15/2020 upon evaluation today patient appears to be doing somewhat poorly in regard to her right leg. She tells me she has been having more pain  she has been taking the Keflex that was previously prescribed unfortunately that just does not seem to help with this. She was hoping that the pain on her right was actually coming from the fact that she was having issues with her wrap having gotten caught in her recliner. With that being said she tells me that she knew something was not right. Currently her right leg is warm to touch along with being erythematous all the way up to around at least mid thigh as far as I can see. The left leg does not appear to be doing that badly though there is increased weeping around the ankle region. 05/06/2020 on evaluation today patient appears to be doing much better than last time I saw her. She did go to the hospital where she was admitted for 2 days and treated with antibiotic therapy. She was discharged with antibiotics as well and has done extremely well. I am extremely pleased with where things stand today. There is no signs of active infection at this time which is great news. 05/27/2020 upon evaluation today patient appears to be doing well with regard to her lower extremities bilaterally. She has just a very tiny area on the right leg which is opening on the left leg she is significantly improved though she still has several areas that do appear to be open this is minimal compared to what is been in the past. In general I am extremely pleased with where things stand today. The patient does tell me she is not been using her pumps quite as much as she should be. I do believe  that is 1 area she can definitely work on. She has had a lot going on including a Covid exposure and apparently also a outbreak of likely shingles. 06/24/2020 upon evaluation today patient appears to be doing well with regard to her legs in general although the left leg unfortunately is showing some signs of erythema she does have a little bit of increased weeping and to be honest I am concerned about infection here. I discussed that with her today and I think that we may need to address this sooner rather than later she has been taking Keflex she is not really certain that is been making a big improvement however. No fevers, chills, nausea, vomiting, or diarrhea. 06/30/2020 upon evaluation today patient's legs actually seem to be doing better in my opinion as compared to where they were last week. Fortunately there does not appear to be any signs of active infection. Her culture showed multiple organisms nothing predominate. With that being said the Levaquin seems to have done well I think she has improved since I last saw her as well. 07/14/2020 upon evaluation today patient appears to be doing actually better in regard to her lower extremities in my opinion. She has been tolerating the dressing changes without complication. Fortunately there is no signs of active infection at this time. 08/04/2020 upon evaluation today patient appears to be doing excellent in regard to her leg ulcers. Fortunately she has very little that is open at this point. This is great news. In fact I think that the Goldbond medicated powder has been excellent for her. It seems to have done the trick where we had tried several other things without as much success. Fortunately there is no evidence of active infection at this time. No fevers, chills, nausea, vomiting, or diarrhea. 09/01/2020 upon evaluation today patient appears to be doing a little bit more poorly than the last  time I saw her. She tells me right now that she has  been having a lot of drainage compared to where things were previous which has unfortunately led to more irritation as well. She is concerned that this is leading to infection. Again based on what I am seeing today as well I am also concerned of the same to be honest. 09/15/2020 upon evaluation today patient appears to be doing well with regard to her legs compared to what I saw previous. Fortunately there does not appear to be any evidence of active infection at this time which is great news. At least not as badly as what it was previous. With that being said I do believe that the patient does need to have an extension of the antibiotics. This I believe will actually help her more in the way of making sure this stays under control and does not worsen. That was the Bactrim DS. Readmission: 08/01/2021 upon evaluation today patient appears to be doing actually pretty well all things considered. Is actually been almost a year since have seen her last March. Fortunately I do not see any evidence of active infection locally nor systemically at this point. She does have a couple open areas on the left leg the right leg appears to be doing quite well. In general she has been in the hospital 3 times since I saw her a year ago all 4 cellulitis type issues except for the last 1 which was actually more related to congestive heart failure for that reason she is not using her lymphedema pumps at this point and that is probably the best idea. DEZERAY, PUCCIO ([id number]) [id number] Page 11 of 13 08/08/2021 upon evaluation today patient appears to be doing well with regard to her legs all things considered her left leg may be a little bit more swollen based on what I see today. Fortunately I do not see any signs of active infection locally nor systemically at this time which is great news. No fevers, chills, nausea, vomiting, or diarrhea. 08/15/2021 upon evaluation today patient actually  appears to be doing excellent in regard to her wounds. She has been tolerating the dressing changes without complication. Fortunately I see no evidence of infection currently which is great news. No fevers, chills, nausea, vomiting, or diarrhea. 08/29/2021 upon evaluation patient appears to be doing excellent she in fact is almost completely healed. I am actually very pleased with where we stand today and I think that she is making wonderful progress she just has a small area of weeping on the right lateral leg everything else is pretty much completely healed which is also. 09/05/2021 upon evaluation today patient appears to be doing well with regard to her legs. She does have a small area of weeping on the right leg and there is a small area on the left leg as well. It is potentially something that may need to be addressed in the future more specifically but right now I think that there may have just been a little drainage here on the left. With all that being said I think that this still does not appear to be infected and overall I feel like she is doing quite well. That something we always have to keep a very close eye on as she can change very rapidly from okay to not okay. 09/12/2021 upon evaluation today patient appears to be doing a little bit worse in regard to her leg and actually somewhat concerned about the possibility of infection  here. I discussed that with the patient. For that reason I am going to go ahead and see about getting her set up for a repeat prescription for the Bactrim. She is in agreement with that plan. 3/14; patient presents for follow-up. She has been using silver alginate with dressing changes. She is still taking Bactrim prescribed at last clinic visit. She has no issues or complaints today. She has lymphedema pumps however does not use these. 09/26/2020 upon evaluation today patient appears to actually be doing well in regard to her right leg I am pleased in that regard.  Unfortunately she has new areas on her left leg that are open at this point that I do need to be addressed. I am also concerned about the warmth around one of the wounds on the more anterior side. I think this is something that we may need to culture today potentially requiring antibiotics going forward. 10/03/2021 upon evaluation today patient unfortunately is not doing nearly as well as she was even last week with her wounds. She is having some issues here with increased cellulitis of the left lower extremity unfortunately. I did prescribe Augmentin for her eczema prescribed last week unfortunately she never actually received this prescription. The pharmacy stated they never got it. We double checked with them today they still did not receive it. I am not sure what happened in that regard. With that being said the patient is in good spirits today she does not appear to be as sick as where she normally is when the cellulitis gets significantly worse as it has in the past. Nonetheless the leg is much more red not just around the ankle where I was seeing at last week on Tuesday but rather now this is going all the way up to almost her knee and is definitely hot to touch compared to the right leg. Nonetheless I feel like she does need to go to the ER for likely IV antibiotics. 10-10-2021 upon evaluation today patient appears to be doing well with regard to her wound. She has been tolerating the dressing changes that appears to be doing much better. After I saw her last week she did go to the ER they admitted her to the hospital and gave her IV antibiotics she fortunately is doing significantly better. Overall I am extremely pleased with where things stand today. 10-17-2021 upon evaluation today patient appears to be doing well with regard to her wound. In fact this is looking better and her leg is looking better but she still does have some areas here of erythema. We will continue to address this as soon as  possible in my opinion. I do believe she may require some debridement and what appears to be a more defined wound on her left leg at this point. 10-24-2021 upon evaluation patient is definitely showing signs of improvement which is great news. I do not see any evidence of active infection locally or systemically which is great news as well. No fevers, chills, nausea, vomiting, or diarrhea. 10-31-2021. Upon evaluation today patient's leg actually feels better from an infection and wound care perspective. I am actually very pleased in this regard. Unfortunately the biggest issue that I see at this time is that the patient is having a significant issue with her breathing. I did put her on the pulse ox machine to check her oxygen saturation and it was pretty much at 100% which is good but she tells me that when she is walking this is much  more difficult for her. She also tells me that when she is trying to bend over to perform her dressing changes she is so short of breath due to the swelling and fluid in her abdominal area that she is just not been able to do this. She is tearful and very concerned today to be honest. I am truly sorry to see her this way I know she is really having a hard time at the moment. 11-14-2021 upon evaluation patient actually appears to be doing significantly better compared to when I last saw her. She was actually admitted to the hospital I believe this for 6 days total after I sent her on 10-31-2021. Subsequently they actually pulled off greater than 50 pounds of fluid she tells me she feels significantly better her legs are not as tight she actually has some play in the tissue and it is obvious that she is doing much better just from a mobility standpoint as well as a breathing standpoint. Overall I am extremely happy for her and how she is doing the leg also looks much better on the left. 11-21-2021 upon evaluation today patient appears to be doing well although it does appear that  she is going require some sharp debridement the wound is very dry this is the opposite of what its been she was having so much weeping that it was staying extremely wet. Nonetheless we do need to see about going ahead and debriding the wound today. 11-28-2021 upon evaluation today patient appears to be doing well with regard to her wound I definitely see signs of things continuing to improve which is great news. I do not see any evidence of active infection locally or systemically also great news. 12-05-2021 upon evaluation today patient appears to be doing well with regard to her wound. She has been tolerating the dressing changes. Fortunately there does not appear to be any signs of active infection locally or systemically at this time. No fevers, chills, nausea, vomiting, or diarrhea. 12-12-2021 upon evaluation patient appears to be doing well with regard to her wound this is actually measuring smaller and looking much better. Fortunately I do not see any signs of active infection locally or systemically at this time which is excellent news. 6/13; small wound area in the folds of her lymphedema just above the left ankle. Fortunately the area looks quite good. She has been using silver alginate ABDs and an Ace wrap which she is able to change her self. She is not using her compression pumps out of fear that this could contribute to heart failure. 12-26-2021 upon evaluation patient's wounds are actually showing signs of excellent improvement. I am very pleased with where things stand. Overall I think that she has been making excellent progress and I think we are very close to complete resolution which is great news. 01-11-2022 upon evaluation today patient appears to be doing well currently in regard to her legs in general although on the left leg she does have 1 area that still irritated and inflamed on the anterior portion of her leg. Fortunately I do not see any signs of systemic infection locally there  might be some infection going on here however which is my biggest concern. Fortunately I do not see any evidence though of this spreading to any other location. 01-16-2022 upon evaluation today patient has actually showing signs of excellent improvement. Fortunately I do not see any evidence of infection locally or systemically which is great news and overall I am very  pleased with where we stand at this point. 01-25-2022 upon evaluation today patient actually appears to be doing decently well in regard to her legs. She has been tolerating the dressing changes without complication. Fortunately there does not appear to be any evidence of active infection locally or systemically which is great news and overall I am extremely pleased with where we stand at this point. 02-13-2022 upon evaluation today patient appears to be doing well currently in regard to her wound which is actually showing signs of significant improvement. JAMICE, CARRENO ([id number]) [id number] Page 12 of 13 Fortunately I do not see any evidence of active infection locally or systemically at this time. No fevers, chills, nausea, vomiting, or diarrhea. 02-20-2022 upon evaluation today patient appears to be doing well currently in regard to her wound. She has been tolerating the dressing changes without complication. Fortunately there does not appear to be any signs of active infection locally or systemically at this time. No fevers, chills, nausea, vomiting, or diarrhea. 02-27-2022 upon evaluation today patient appears to be doing excellent in regard to her wounds on the leg. She has been tolerating the dressing changes without complication this is very close to complete resolution. Fortunately I do not see any signs of infection locally or systemically at this time which is great news. No fevers, chills, nausea, vomiting, or diarrhea. 03-06-2022 upon evaluation today patient appears to be doing well currently in  regard to her wound in general. She has been tolerating the dressing changes without complication. Fortunately I do not see any evidence of active infection locally or systemically at this time which is great news. I am concerned a little bit however about not really her wounds but the second toe right foot where there appears to be some cellulitis after she dropped a frying pan on this about 2 weeks ago. She tells me its been itching and burning and I do think that it is possible she may have an infection based on what we are seeing currently. 03-20-2022 upon evaluation today patient actually appears to be doing much better at this point. Fortunately there does not appear to be any signs of active infection locally or systemically at this time which is great news. No fevers, chills, nausea, vomiting, or diarrhea. 03-27-2022 upon evaluation today patient appears to be doing somewhat poorly in regard to her legs compared to previous. Her right leg has a couple areas that are open and she is very warm and painful to touch around the lateral portion of the ankle area. Left leg is showing signs of little bit more drainage and weeping as well I think that this is probably due to the fact that she is swelling a lot more that she has been in the past. Fortunately I do not see any signs of active infection systemically though locally there is definitely some issues here. 04-03-2022 upon evaluation today patient appears to be doing poorly in regard to her wounds on the legs unfortunately. She also seems to have more significant cellulitis that has ensued. Unfortunately she has been on doxycycline initially it seemed to be helping but as of this week that is no longer the case. It started getting worse last week and with the reinitiation of the doxycycline nothing has improved. Often when she gets like this IV antibiotics are necessary. 04-19-2022 upon evaluation today patient appears to be doing well currently in  regard to her legs from an infection standpoint. She did get IV vancomycin in the hospital  and states that her legs did get better when she left she was no longer weeping but she had been in the bed for 4 days straight. With that being said since coming home her legs have begun to weep again the original wound that we were dealing with has healed she does have some weeping over both lower extremities however which is still of concern. 05-03-2022 upon evaluation today patient appears to be doing well currently in regard to her left leg which I feel like is a little bit better unfortunately the right leg is still draining significantly. Fortunately I do not see any signs of infection locally or systemically at this time. No fevers, chills, nausea, vomiting, or diarrhea. Objective Constitutional Obese and well-hydrated in no acute distress. Vitals Time Taken: 11:10 AM, Height: 63 in, Weight: 372 lbs, BMI: 65.9, Temperature: 98.0 F, Pulse: 85 bpm, Respiratory Rate: 18 breaths/min, Blood Pressure: 118/63 mmHg. Respiratory normal breathing without difficulty. Psychiatric this patient is able to make decisions and demonstrates good insight into disease process. Alert and Oriented x 3. pleasant and cooperative. General Notes: Upon inspection patient's wounds again do not appear to be infected but she is having a tremendous amount of drainage at this time which is unfortunate. I do believe that she should continue to monitor for any signs of infection such as increased discomfort or pain if that happens she knows to go straight to the ER. In the meantime she is on the Keflex which I prescribed for her as well. 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 I am going to suggest that we have the patient  continue to monitor for any signs of infection. Obviously if anything changes she should let me know or go to the ER if the infection seems to be getting worse. 2. I am going to recommend that we try to do something a little unconventional and have her use a Ace wrap followed by a second Ace wrap over top to try to help with the drainage at this point. Again she does see her kidney doctor soon and at that point she is going to discuss with them the possibility for going back KELBIE, MORO ([id number]) [id number] Page 13 of 13 to using her lymphedema pumps I think if she can do that that will help a lot with her edema. We will see patient back for reevaluation in 1 week here in the clinic. If anything worsens or changes patient will contact our office for additional recommendations. Electronic Signature(s) Signed: 05/03/2022 11:56:49 AM By: Worthy Keeler PA-C Entered By: Worthy Keeler on 05/03/2022 11:56:48 -------------------------------------------------------------------------------- SuperBill Details Patient Name: Date of Service: TRINITEY, ROACHE 05/03/2022 Medical Record Number: [id number] Patient Account Number: [id number] Date of Birth/Sex: Treating RN: 23-Sep-1955 (66 y.o. [place] Primary Care Provider: Ranae Plumber Other Clinician: Massie Kluver Referring Provider: Treating Provider/Extender: Skeet Simmer in Treatment: 39 Diagnosis Coding ICD-10 Codes Code Description Q82.0 Hereditary lymphedema L97.811 Non-pressure chronic ulcer of other part of right lower leg limited to breakdown of skin L97.821 Non-pressure chronic ulcer of other part of left lower leg limited to breakdown of skin Physician Procedures : CPT4 Code Description Modifier [id number] - WC PHYS LEVEL 3 - EST PT ICD-10 Diagnosis Description Q82.0 Hereditary lymphedema L97.811 Non-pressure chronic ulcer of  other part of right lower leg limited to  breakdown of skin L97.821 Non-pressure  chronic ulcer of other part of left lower leg limited to breakdown of skin Quantity: 1 Electronic Signature(s) Signed: 05/03/2022 11:58:29 AM By: Worthy Keeler PA-C Entered By: Worthy Keeler on 05/03/2022 11:58:28

## 2022-05-07 ENCOUNTER — Encounter (INDEPENDENT_AMBULATORY_CARE_PROVIDER_SITE_OTHER): Payer: Self-pay

## 2022-05-10 ENCOUNTER — Inpatient Hospital Stay: Payer: Medicare HMO

## 2022-05-10 ENCOUNTER — Encounter: Payer: Medicare HMO | Attending: Physician Assistant | Admitting: Physician Assistant

## 2022-05-10 ENCOUNTER — Other Ambulatory Visit: Payer: Self-pay

## 2022-05-10 ENCOUNTER — Inpatient Hospital Stay
Admission: EM | Admit: 2022-05-10 | Discharge: 2022-05-16 | DRG: 602 | Disposition: A | Discharge status: Home Health | Payer: Medicare HMO | Attending: Internal Medicine | Admitting: Internal Medicine

## 2022-05-10 DIAGNOSIS — R6521 Severe sepsis with septic shock: Secondary | ICD-10-CM | POA: Diagnosis not present

## 2022-05-10 DIAGNOSIS — I5032 Chronic diastolic (congestive) heart failure: Secondary | ICD-10-CM | POA: Diagnosis present

## 2022-05-10 DIAGNOSIS — I130 Hypertensive heart and chronic kidney disease with heart failure and stage 1 through stage 4 chronic kidney disease, or unspecified chronic kidney disease: Secondary | ICD-10-CM | POA: Diagnosis present

## 2022-05-10 DIAGNOSIS — I890 Lymphedema, not elsewhere classified: Secondary | ICD-10-CM

## 2022-05-10 DIAGNOSIS — D631 Anemia in chronic kidney disease: Secondary | ICD-10-CM | POA: Diagnosis present

## 2022-05-10 DIAGNOSIS — Z888 Allergy status to other drugs, medicaments and biological substances status: Secondary | ICD-10-CM

## 2022-05-10 DIAGNOSIS — Z886 Allergy status to analgesic agent status: Secondary | ICD-10-CM

## 2022-05-10 DIAGNOSIS — A419 Sepsis, unspecified organism: Secondary | ICD-10-CM | POA: Clinically undetermined

## 2022-05-10 DIAGNOSIS — Q820 Hereditary lymphedema: Secondary | ICD-10-CM

## 2022-05-10 DIAGNOSIS — Z87891 Personal history of nicotine dependence: Secondary | ICD-10-CM | POA: Diagnosis not present

## 2022-05-10 DIAGNOSIS — G4733 Obstructive sleep apnea (adult) (pediatric): Secondary | ICD-10-CM | POA: Diagnosis present

## 2022-05-10 DIAGNOSIS — Z720 Tobacco use: Secondary | ICD-10-CM

## 2022-05-10 DIAGNOSIS — G629 Polyneuropathy, unspecified: Secondary | ICD-10-CM

## 2022-05-10 DIAGNOSIS — L97821 Non-pressure chronic ulcer of other part of left lower leg limited to breakdown of skin: Secondary | ICD-10-CM | POA: Diagnosis not present

## 2022-05-10 DIAGNOSIS — R1110 Vomiting, unspecified: Secondary | ICD-10-CM | POA: Diagnosis not present

## 2022-05-10 DIAGNOSIS — N1832 Chronic kidney disease, stage 3b: Secondary | ICD-10-CM | POA: Diagnosis present

## 2022-05-10 DIAGNOSIS — Z79899 Other long term (current) drug therapy: Secondary | ICD-10-CM

## 2022-05-10 DIAGNOSIS — N179 Acute kidney failure, unspecified: Secondary | ICD-10-CM | POA: Diagnosis present

## 2022-05-10 DIAGNOSIS — E872 Acidosis, unspecified: Secondary | ICD-10-CM | POA: Diagnosis not present

## 2022-05-10 DIAGNOSIS — Z881 Allergy status to other antibiotic agents status: Secondary | ICD-10-CM

## 2022-05-10 DIAGNOSIS — L03119 Cellulitis of unspecified part of limb: Secondary | ICD-10-CM | POA: Diagnosis not present

## 2022-05-10 DIAGNOSIS — I129 Hypertensive chronic kidney disease with stage 1 through stage 4 chronic kidney disease, or unspecified chronic kidney disease: Secondary | ICD-10-CM | POA: Diagnosis not present

## 2022-05-10 DIAGNOSIS — L03115 Cellulitis of right lower limb: Principal | ICD-10-CM | POA: Diagnosis present

## 2022-05-10 DIAGNOSIS — N189 Chronic kidney disease, unspecified: Secondary | ICD-10-CM | POA: Diagnosis not present

## 2022-05-10 DIAGNOSIS — M1990 Unspecified osteoarthritis, unspecified site: Secondary | ICD-10-CM | POA: Diagnosis present

## 2022-05-10 DIAGNOSIS — L97811 Non-pressure chronic ulcer of other part of right lower leg limited to breakdown of skin: Secondary | ICD-10-CM | POA: Diagnosis not present

## 2022-05-10 DIAGNOSIS — L03116 Cellulitis of left lower limb: Secondary | ICD-10-CM | POA: Diagnosis present

## 2022-05-10 DIAGNOSIS — E876 Hypokalemia: Secondary | ICD-10-CM | POA: Diagnosis present

## 2022-05-10 DIAGNOSIS — L0390 Cellulitis, unspecified: Secondary | ICD-10-CM | POA: Diagnosis present

## 2022-05-10 DIAGNOSIS — Z6841 Body Mass Index (BMI) 40.0 and over, adult: Secondary | ICD-10-CM | POA: Diagnosis not present

## 2022-05-10 LAB — CBC WITH DIFFERENTIAL/PLATELET
Abs Immature Granulocytes: 0.02 10*3/uL (ref 0.00–0.07)
Basophils Absolute: 0 10*3/uL (ref 0.0–0.1)
Basophils Relative: 1 %
Eosinophils Absolute: 0 10*3/uL (ref 0.0–0.5)
Eosinophils Relative: 0 %
HCT: 31.8 % — ABNORMAL LOW (ref 36.0–46.0)
Hemoglobin: 10 g/dL — ABNORMAL LOW (ref 12.0–15.0)
Immature Granulocytes: 0 %
Lymphocytes Relative: 32 %
Lymphs Abs: 1.5 10*3/uL (ref 0.7–4.0)
MCH: 25.9 pg — ABNORMAL LOW (ref 26.0–34.0)
MCHC: 31.4 g/dL (ref 30.0–36.0)
MCV: 82.4 fL (ref 80.0–100.0)
Monocytes Absolute: 0.7 10*3/uL (ref 0.1–1.0)
Monocytes Relative: 14 %
Neutro Abs: 2.6 10*3/uL (ref 1.7–7.7)
Neutrophils Relative %: 53 %
Platelets: 162 10*3/uL (ref 150–400)
RBC: 3.86 MIL/uL — ABNORMAL LOW (ref 3.87–5.11)
RDW: 14.6 % (ref 11.5–15.5)
WBC: 4.8 10*3/uL (ref 4.0–10.5)
nRBC: 0 % (ref 0.0–0.2)

## 2022-05-10 LAB — COMPREHENSIVE METABOLIC PANEL
ALT: 9 U/L (ref 0–44)
AST: 16 U/L (ref 15–41)
Albumin: 3.2 g/dL — ABNORMAL LOW (ref 3.5–5.0)
Alkaline Phosphatase: 93 U/L (ref 38–126)
Anion gap: 8 (ref 5–15)
BUN: 43 mg/dL — ABNORMAL HIGH (ref 8–23)
CO2: 32 mmol/L (ref 22–32)
Calcium: 8.8 mg/dL — ABNORMAL LOW (ref 8.9–10.3)
Chloride: 99 mmol/L (ref 98–111)
Creatinine, Ser: 1.66 mg/dL — ABNORMAL HIGH (ref 0.44–1.00)
GFR, Estimated: 34 mL/min — ABNORMAL LOW (ref >60)
Glucose, Bld: 93 mg/dL (ref 70–99)
Potassium: 3.7 mmol/L (ref 3.5–5.1)
Sodium: 139 mmol/L (ref 135–145)
Total Bilirubin: 0.7 mg/dL (ref 0.3–1.2)
Total Protein: 8.2 g/dL — ABNORMAL HIGH (ref 6.5–8.1)

## 2022-05-10 LAB — D-DIMER, QUANTITATIVE: D-Dimer, Quant: 2.77 ug/mL-FEU — ABNORMAL HIGH (ref 0.00–0.50)

## 2022-05-10 LAB — GLUCOSE, CAPILLARY: Glucose-Capillary: 99 mg/dL (ref 70–99)

## 2022-05-10 LAB — LACTIC ACID, PLASMA
Lactic Acid, Venous: 1.4 mmol/L (ref 0.5–1.9)
Lactic Acid, Venous: 1.7 mmol/L (ref 0.5–1.9)
Lactic Acid, Venous: 5.5 mmol/L (ref 0.5–1.9)
Lactic Acid, Venous: 3.6 mmol/L (ref 0.5–1.9)

## 2022-05-10 LAB — SEDIMENTATION RATE: Sed Rate: 94 mm/hr — ABNORMAL HIGH (ref 0–30)

## 2022-05-10 LAB — TROPONIN I (HIGH SENSITIVITY): Troponin I (High Sensitivity): 7 ng/L (ref <18)

## 2022-05-10 MED ORDER — DIPHENHYDRAMINE HCL 50 MG/ML IJ SOLN
12.5000 mg | Freq: Three times a day (TID) | INTRAMUSCULAR | Status: DC | PRN
  Administered 2022-05-10: 12.5 mg via INTRAVENOUS
  Filled 2022-05-10: qty 1

## 2022-05-10 MED ORDER — SODIUM CHLORIDE 0.9 % IV SOLN
2.0000 g | INTRAVENOUS | Status: DC
  Administered 2022-05-10: 2 g via INTRAVENOUS
  Filled 2022-05-10: qty 20

## 2022-05-10 MED ORDER — VANCOMYCIN HCL IN DEXTROSE 1-5 GM/200ML-% IV SOLN
1000.0000 mg | Freq: Once | INTRAVENOUS | Status: AC
  Administered 2022-05-10: 1000 mg via INTRAVENOUS
  Filled 2022-05-10: qty 200

## 2022-05-10 MED ORDER — VITAMIN B-12 1000 MCG PO TABS
1000.0000 ug | ORAL_TABLET | ORAL | Status: DC
  Administered 2022-05-11 – 2022-05-15 (×3): 1000 ug via ORAL
  Filled 2022-05-10 (×3): qty 1

## 2022-05-10 MED ORDER — SODIUM CHLORIDE 0.9 % IV SOLN: 2.0000 g | INTRAVENOUS | Status: DC

## 2022-05-10 MED ORDER — SENNOSIDES-DOCUSATE SODIUM 8.6-50 MG PO TABS: 1.0000 | ORAL_TABLET | Freq: Every evening | ORAL | Status: DC | PRN

## 2022-05-10 MED ORDER — HEPARIN SODIUM (PORCINE) 5000 UNIT/ML IJ SOLN
5000.0000 [IU] | Freq: Three times a day (TID) | INTRAMUSCULAR | Status: DC
  Administered 2022-05-10 – 2022-05-16 (×17): 5000 [IU] via SUBCUTANEOUS
  Filled 2022-05-10 (×17): qty 1

## 2022-05-10 MED ORDER — LACTATED RINGERS IV SOLN: INTRAVENOUS | Status: AC

## 2022-05-10 MED ORDER — IOHEXOL 300 MG/ML  SOLN
100.0000 mL | Freq: Once | INTRAMUSCULAR | Status: AC | PRN
  Administered 2022-05-10: 100 mL via INTRAVENOUS

## 2022-05-10 MED ORDER — ACETAMINOPHEN 325 MG PO TABS
650.0000 mg | ORAL_TABLET | Freq: Four times a day (QID) | ORAL | Status: AC | PRN
  Administered 2022-05-11 – 2022-05-13 (×3): 650 mg via ORAL
  Filled 2022-05-10 (×3): qty 2

## 2022-05-10 MED ORDER — SODIUM CHLORIDE 0.9 % IV SOLN: 12.5000 mg | Freq: Four times a day (QID) | INTRAVENOUS | Status: DC | PRN

## 2022-05-10 MED ORDER — LORAZEPAM 2 MG/ML IJ SOLN
0.5000 mg | Freq: Four times a day (QID) | INTRAMUSCULAR | Status: DC | PRN
  Administered 2022-05-10: 0.5 mg via INTRAVENOUS
  Filled 2022-05-10: qty 1

## 2022-05-10 MED ORDER — ACETAMINOPHEN 650 MG RE SUPP: 650.0000 mg | Freq: Four times a day (QID) | RECTAL | Status: AC | PRN

## 2022-05-10 MED ORDER — ONDANSETRON HCL 4 MG/2ML IJ SOLN
4.0000 mg | Freq: Four times a day (QID) | INTRAMUSCULAR | Status: AC | PRN
  Administered 2022-05-10: 4 mg via INTRAVENOUS
  Filled 2022-05-10: qty 2

## 2022-05-10 MED ORDER — VANCOMYCIN HCL 1500 MG/300ML IV SOLN
1500.0000 mg | Freq: Once | INTRAVENOUS | Status: AC
  Administered 2022-05-10: 1500 mg via INTRAVENOUS
  Filled 2022-05-10 (×2): qty 300

## 2022-05-10 MED ORDER — VANCOMYCIN HCL 1250 MG/250ML IV SOLN: 1250.0000 mg | INTRAVENOUS | Status: DC

## 2022-05-10 MED ORDER — NYSTATIN 100000 UNIT/GM EX POWD: Freq: Three times a day (TID) | CUTANEOUS | Status: DC | PRN

## 2022-05-10 MED ORDER — SODIUM CHLORIDE 0.9 % IV SOLN
2.0000 g | Freq: Two times a day (BID) | INTRAVENOUS | Status: DC
  Administered 2022-05-10 – 2022-05-13 (×6): 2 g via INTRAVENOUS
  Filled 2022-05-10 (×4): qty 12.5
  Filled 2022-05-10: qty 2
  Filled 2022-05-10 (×3): qty 12.5

## 2022-05-10 MED ORDER — MORPHINE SULFATE (PF) 2 MG/ML IV SOLN: 1.0000 mg | INTRAVENOUS | Status: DC | PRN

## 2022-05-10 MED ORDER — ALBUTEROL SULFATE (2.5 MG/3ML) 0.083% IN NEBU
3.0000 mL | INHALATION_SOLUTION | Freq: Four times a day (QID) | RESPIRATORY_TRACT | Status: DC | PRN
  Administered 2022-05-12 – 2022-05-16 (×3): 3 mL via RESPIRATORY_TRACT
  Filled 2022-05-10 (×3): qty 3

## 2022-05-10 MED ORDER — PROMETHAZINE HCL 25 MG/ML IJ SOLN: 12.5000 mg | Freq: Four times a day (QID) | INTRAMUSCULAR | Status: DC | PRN

## 2022-05-10 MED ORDER — ONDANSETRON HCL 4 MG PO TABS
4.0000 mg | ORAL_TABLET | Freq: Four times a day (QID) | ORAL | Status: AC | PRN
  Administered 2022-05-14: 4 mg via ORAL
  Filled 2022-05-10: qty 1

## 2022-05-10 NOTE — Hospital Course (Addendum)
66 year old female with past medical history of hereditary lymphedema, stage IIIb chronic kidney disease, obstructive sleep apnea and severe morbid obesity who is discharged from the hospital a little over a month ago for sim lower extremity cellulitis and discharged on p.o. antibiotics continue to have issues with persistent erythema, bullous lesions and drainage even after completing an extended antibiotic course, was sent over to the emergency room on 11/2 from her wound care clinic for worsening edema and drainage.  Patient evaluated and found to have cellulitis of the right lower extremity and admitted to the hospitalist service.  Lab work noteworthy for acute kidney injury with a creatinine of 1.66  (baseline around 1.4).  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  Following admission on afternoon of 11/2, patient had an event where she suddenly developed abdominal pain, became tachycardic, spiked blood pressure and then vomited and blood pressure became soft.  Repeat lab work noted jump of lactic acid from 1.7 up to 5.5. Patient started on aggressive fluid resuscitation and Rocephin changed to cefepime.  Follow-up blood work this morning noted white blood cell count of 18.7 (was 4.8 on admission) and procalcitonin level of 30.  During course of day of 11/3, patient's IV access was lost and unable to be replaced.  After discussion with critical care and vascular surgery, patient taken down to Cath Lab and vascular surgery placed central line.  IV antibiotics and IV fluids resumed.

## 2022-05-10 NOTE — Consult Note (Signed)
PHARMACY -  BRIEF ANTIBIOTIC NOTE   Pharmacy has received consult(s) for vancomycin from an ED provider.  The patient's profile has been reviewed for ht/wt/allergies/indication/available labs.    One time order(s) placed for Vancomycin '1000mg'$  IV x1 followed by Vancomycin '1500mg'$  IV x1 (Total '2500mg'$  IV load)  Further antibiotics/pharmacy consults should be ordered by admitting physician if indicated.                       Thank you, Darrick Penna 05/10/2022  1:27 PM

## 2022-05-10 NOTE — ED Notes (Signed)
Lab called to assist with blood draw until IV team can place PIV.

## 2022-05-10 NOTE — ED Notes (Addendum)
Pt did let this RN attempt PIV with no success, MD aware, multiple staff requested if they are [place] IV capable per request and none are at this time, IV team order in place.   Lab at bedside.

## 2022-05-10 NOTE — Assessment & Plan Note (Addendum)
-  Baseline serum creatinine is 1.42/eGFR 41.  On admission, creatinine at 1.6 and then this morning up to 2, likely secondary to hypotension brought on by development of shock.  Continue IV fluids now that IV access replaced and recheck labs in the morning.

## 2022-05-10 NOTE — ED Notes (Signed)
Attempted PIV in left hand. Pt not allowing any other sticks and is requesting ultrasound IV.

## 2022-05-10 NOTE — Assessment & Plan Note (Signed)
-   Cpap qhs ordered

## 2022-05-10 NOTE — ED Notes (Addendum)
Pt presents to ED with c/o of being sent by wound care clinic due to bilateral LLE. Pt also have significant lymphedema as well. Pt denies fevers or chills. Pt does endorse some purulent drainage. Pt does state the infection is worse on the right lower leg.    Pt states she was recently taking ABX orally and states wound care center is concerned the infection is not healing properly.   Pt denies fevers or chills. Pt is ambulatory with steady gait with walker.

## 2022-05-10 NOTE — ED Provider Notes (Addendum)
[facility] Provider Note    Event Date/Time   First MD Initiated Contact with Patient 05/10/22 1225     (approximate)   History   Wound Infection   HPI  [name] is a 66 y.o. female with a history of severe lymphedema as well as CHF previous hospitalization for cellulitis extremities who presents to the ER today due to worsening pain swelling weeping drainage from the right leg.  Both legs were evaluated.  Clinic today patient was sent to the ER for admission for IV antibiotics.  She denies any measured temperature or fevers.     Physical Exam   Triage Vital Signs: ED Triage Vitals  Enc Vitals Group     BP 05/10/22 1223 126/71     Pulse Rate 05/10/22 1223 91     Resp 05/10/22 1223 19     Temp 05/10/22 1223 98 F (36.7 C)     Temp src --      SpO2 05/10/22 1223 99 %     Weight --      Height --      Head Circumference --      Peak Flow --      Pain Score 05/10/22 1221 2     Pain Loc --      Pain Edu? --      Excl. in [place]? --     Most recent vital signs: Vitals:   05/10/22 1300 05/10/22 1400  BP: 103/61 (!) 113/59  Pulse: 81 80  Resp: 18   Temp:    SpO2: 100% 100%     Constitutional: Alert  Eyes: Conjunctivae are normal.  Head: Atraumatic. Nose: No congestion/rhinnorhea. Mouth/Throat: Mucous membranes are moist.   Neck: Painless ROM.  Cardiovascular:   Good peripheral circulation. Respiratory: Normal respiratory effort.  No retractions.  Gastrointestinal: Soft and nontender.  Musculoskeletal: Significant bilateral lymphedema with bilateral lower extremity erythema.  The right hand dressing from wound clinic. Neurologic:  MAE spontaneously. No gross focal neurologic deficits are appreciated.  Skin:  Skin is warm, dry and intact. No rash noted. Psychiatric: Mood and affect are normal. Speech and behavior are normal.    ED Results / Procedures / Treatments   Labs (all labs ordered are listed, but only abnormal  results are displayed) Labs Reviewed  CBC WITH DIFFERENTIAL/PLATELET - Abnormal; Notable for the following components:      Result Value   RBC 3.86 (*)    Hemoglobin 10.0 (*)    HCT 31.8 (*)    MCH 25.9 (*)    All other components within normal limits  COMPREHENSIVE METABOLIC PANEL - Abnormal; Notable for the following components:   BUN 43 (*)    Creatinine, Ser 1.66 (*)    Calcium 8.8 (*)    Total Protein 8.2 (*)    Albumin 3.2 (*)    GFR, Estimated 34 (*)    All other components within normal limits     EKG     RADIOLOGY    PROCEDURES:  Critical Care performed:   Procedures   MEDICATIONS ORDERED IN ED: Medications  cefTRIAXone (ROCEPHIN) 2 g in sodium chloride 0.9 % 100 mL IVPB (has no administration in time range)  vancomycin (VANCOCIN) IVPB 1000 mg/200 mL premix (has no administration in time range)    Followed by  vancomycin (VANCOREADY) IVPB 1500 mg/300 mL (has no administration in time range)     IMPRESSION / MDM / ASSESSMENT AND PLAN / ED COURSE  I reviewed the triage vital signs and the nursing notes.                              Differential diagnosis includes, but is not limited to, cellulitis, ischemia, lymphedema, venous stasis  Patient presenting to the ER for evaluation of symptoms as described above.  Based on symptoms, risk factors and considered above differential, this presenting complaint could reflect a potentially life-threatening illness therefore the patient will be placed on continuous pulse oximetry and telemetry for monitoring.  Laboratory evaluation will be sent to evaluate for the above complaints.  Will order IV Rocephin as well as IV Vanco.  Hospitalist consulted for admission.      FINAL CLINICAL IMPRESSION(S) / ED DIAGNOSES   Final diagnoses:  Cellulitis of lower extremity, unspecified laterality     Rx / DC Orders   ED Discharge Orders     None        Note:  This document was prepared using Dragon voice  recognition software and may include unintentional dictation errors.    Merlyn Lot, MD 05/10/22 1311    Merlyn Lot, MD 05/10/22 [phone]

## 2022-05-10 NOTE — Assessment & Plan Note (Addendum)
As above.

## 2022-05-10 NOTE — Consult Note (Addendum)
Pharmacy Antibiotic Note  [name] is a 66 y.o. female admitted on 05/10/2022 with  cellulitis .  Pharmacy has been consulted for vancomycin and cefepime dosing.  PTA: Patient was recently on oral antibiotic therapy (doxycycline 7/6 - 9/19 and cephalexin 10/1 - 11/1) 11/2 Patient received '2500mg'$  IV vancomycin load x1 in ED.   Plan:  1 ) Give Vancomycin '1500mg'$  IV every 24 hours. Goal AUC 400-550  Est AUC: 504.1  Est Cmax: 30.6 Est Cmin: 14.4 Calculated with SCr 1.66 mg/dL Continue to monitor and dose adjust antibiotics according to renal function and indication  2) start cefepime 2 grams IV every 12 hours     Temp (24hrs), Avg:98 F (36.7 C), Min:98 F (36.7 C), Max:98 F (36.7 C)  Recent Labs  Lab 05/10/22 1340  WBC 4.8  CREATININE 1.66*    CrCl cannot be calculated (Unknown ideal weight.).    Allergies  Allergen Reactions   Ace Inhibitors Hives and Swelling   Ibuprofen Hives and Swelling   Vancomycin Itching and Nausea And Vomiting    Antimicrobials this admission: 11/2 cefepime >>  11/2 vancomycin >>   Microbiology results: N/A  Thank you for allowing pharmacy to be a part of this patient's care.  Darrick Penna 05/10/2022 3:07 PM

## 2022-05-10 NOTE — ED Notes (Signed)
Pt's [place] IV is not efficiently working and pt is stating it is hurting, this RN stopped rocephin for now due to pain. IP RN made aware and stated it was okay to proceed with transport to IP unit. New IV team consult placed. Current IV removed per pt request.   Remaining Rocephin sent to floor with pt so when new IV is placed, rocephin can be continued with no dosing issues.

## 2022-05-10 NOTE — Progress Notes (Addendum)
[name] ([id number]) [id number] Page 1 of 14 Visit Report for 05/10/2022 Chief Complaint Document Details Patient Name: Date of Service: [name] 05/10/2022 10:30 A M Medical Record Number: [id number] Patient Account Number: [id number] Date of Birth/Sex: Treating RN: 20-Jun-1956 (66 y.o. [name] Primary Care Provider: Ranae Plumber Other Clinician: Massie Kluver Referring Provider: Treating Provider/Extender: Skeet Simmer in Treatment: 27 Information Obtained from: Patient Chief Complaint Bilateral LE lymphedema with Left LE Ulcers Electronic Signature(s) Signed: 05/10/2022 11:30:07 AM By: Worthy Keeler PA-C Entered By: Worthy Keeler on 05/10/2022 11:30:07 -------------------------------------------------------------------------------- HPI Details Patient Name: Date of Service: [name] 05/10/2022 10:30 A M Medical Record Number: [id number] Patient Account Number: [id number] Date of Birth/Sex: Treating RN: 1956/01/17 (66 y.o. [name] Primary Care Provider: Ranae Plumber Other Clinician: Massie Kluver Referring Provider: Treating Provider/Extender: Skeet Simmer in Treatment: 29 History of Present Illness HPI Description: The patient is a 66 year old female with history of hypertension and a long-standing history of bilateral lower extremity lymphedema (first presented on 4/2) . She has had open ulcers in the past which have always responded to compression therapy. She had briefly been to a lymphedema clinic in the past which helped her at the time. this time around she stopped treatment of her lymphedema pumps approximately 2 weeks ago because of some pain in the knees and then noticed the right leg getting worse. She was seen by her PCP who put her on clindamycin 4 times a day 2 days ago. The patient has seen [facility] and Dr. Delana Meyer had seen her last year where a vascular  study including venous and arterial duplex studies were within normal limits. he had recommended compression stockings and lymphedema pumps and the patient has been using this in about 2 weeks ago. She is known to be diabetic but in the past few time she's gone to her primary care doctor her hemoglobin A1c has been normal. 02/11/2015 - after her last visit she took my advice and went to the ER regarding the progressive cellulitis of her right lower extremity and she was admitted between July 17 and 22nd. She received IV antibiotics and then was sent home on a course of steroid-induced and oral antibiotics. She has improved much since then. 02/17/2015 -- she has been doing fine and the weeping of her legs has remarkably gone down. She has no fresh issues. [name], [name] ([id number]) [id number] Page 2 of 14 01/15/18 This patient was given this clinic before most recently in 2016 seen by Dr. Con Memos. She has massive bilateral lymphedema and over the last 2 months this had weeping edema out of the left leg. She has compression pumps but her compliance with these has been minimal. She has [facility] they've been using TCA/ABDs/kerlix under an Ace wrap.she has had recent problems with cellulitis. She was apparently seen in the ER and 12/23/17 and given clindamycin. She was then followed by her primary doctor and given doxycycline and Keflex. The pain seems to have settled down. In April 2018 the patient had arterial studies done at [facility]. This showed triphasic waveforms throughout the right leg and mostly triphasic waveforms on the left except for monophasic at the posterior tibial artery distally. She was not felt to have evidence of right lower extremity arterial stenosis or significant problems on the left side. 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 She also had a right  lower extremity venous Doppler in  January 2018 this was limited by the patient's body habitus and lymphedema. Most of the proximal veins were not visualized The patient presents with an area of denuded skin on the anterior medial part of the left calf. There is weeping edema fluid here. 01/22/18; the patient has somewhat better edema control using her compression pumps twice a day and as a result she has much better epithelialization on the left anterior calf area. Only a small open area remains. 01/29/18; the patient has been compliant with her compression pumps. Both the areas on her calf that healed. The remaining area on the left anterior leg is fully epithelialized Readmission: 02/20/2019 upon evaluation today patient presents for reevaluation due to issues that she is having with the bilateral lower extremities. She actually has wounds open on both legs. On the right she has an area in the crease of her leg on the right around the knee region which is actually draining quite a bit and actually has some fungal type appearance to it. She has been on nystatin powder that seems to have helped to some degree. In regard to the left lower extremity this is actually in the lower portion of her leg closer to the ankle and again is continuing to drain as well unfortunately. There does not appear to be any signs of active infection at this time which is good news. No fevers, chills, nausea, vomiting, or diarrhea. She tells me that since she was seen last year she is actually been doing quite well for the most part with regard to her lower extremities. Unfortunately she now is experiencing a little bit more drainage at this time. She is concerned about getting this under control so that it does not get significantly worse. 02/27/2019 on evaluation today patient appears to be doing somewhat better in regard to her bilateral lower extremity wounds. She has been tolerating the dressing changes without complication. Fortunately there is no signs of  active infection at this point. No fevers, chills, nausea, vomiting, or diarrhea. She did get her dressing supplies which is excellent news she was extremely excited to get these. She also got paperwork from [employer] for their financial assistance program where they may be able to help her out in the future if needed with supplies at discounted prices. 03/06/2019 on evaluation today patient appears to be doing a little worse with regard to both areas of weeping on her bilateral lower extremities. This is around the right medial knee and just above the left ankle. With that being said she is unfortunately not doing as well as I would like to see. I feel like she may need to potentially go see someone at the lymphedema clinic as the wraps that she needs or even beyond what we can do here at the wound care center. She really does not have wounds she just has open areas of weeping that are causing some difficulty for her. Subsequently because of this and the moisture I am concerned about the potential for infection I am going to likely give her a prophylactic antibiotic today, Keflex, just to be on the safe side. Nonetheless again there is no obvious signs of active infection at this time. 03/13/2019 on evaluation today patient appears to be doing well with regard to her bilateral lower extremities where she has been weeping compared to even last week's evaluation. I see some areas of new skin growth which is excellent and overall I am very pleased with  how things seem to be progressing. No fevers, chills, nausea, vomiting, or diarrhea. 03/20/2019 on evaluation today patient unfortunately is continuing to have issues with significant edema of the left lower extremity. Her right side seems to be doing much better. Unfortunately her left side is showing increased weeping of the lower portion of her leg. This is quite unfortunate obviously we were hoping to get her into the lymphedema clinic they really do not seem  to when I see her how if she is draining. Despite the fact this is really not wound related but more lymphedema weeping related. Nonetheless I do not know that this can be helpful for her to even go for that appointment since again I am not sure there is much that they would actually do at this point. We may need to try a 4 layer compression wrap as best we can on her leg. She is on the Augmentin currently although I am still concerned about whether or not there could be potentially something going on infection wise I would obtain a culture though I understand is not the best being that is a surface culture I just 1 to make sure I do not seem to be missing anything. 03/27/2019 on evaluation today patient appears to be doing much better in regard to the left lower extremity compared to last week. Last week she had tremendous weeping which I think was subsequent to infection now she seems to be doing much better and very pleased. This is not completely healed but there is a lot of new skin growth and it has dried out quite a bit. Overall I think that we are doing well with how things are moving along at this time. No fevers, chills, nausea, vomiting, or diarrhea. 04/03/2019 evaluation today patient appears to be doing a little worse this week compared to last time I saw her. I think this may be due to the fact that she is having issues with not being able to sleep in her bed at least not until last night. She is therefore been in a lift chair and subsequently has also had issues with not been able to use her pumps since she could not get in bed. With that being said the patient overall seems to be doing okay I do think I may want extend the antibiotic for a little bit longer at least until we can see if her edema and her weeping gets better and if it is then obviously I can always discontinue the antibiotics as of next week however I want her to continue to have it over the next week. 04/10/2019 on  evaluation today patient unfortunately is still doing poorly with regard to her left lower extremity. Her right is all things considering doing fairly well. On the left however she continues to have spreading of the area of infection and weeping which appears to be even a larger surface area than noted last week. She did have a positive culture for Pseudomonas in particular which seems to have been of concern she still has green/yellow discharge consistent with Pseudomonas and subsequently a tremendous amount of it. This has me obviously still concerned about the infection not really clearing up despite the fact that on culture it appears the Cipro should have been a good option for treating this. I think she may at this point need IV antibiotics since things are not doing better I do not want to get worse and cause sepsis. She is in agreement with the  plan and believes as well that she likely does need to go to the hospital for IV vancomycin. Or something of the like depending on what the recommendation is from the ER. 04/17/2019 on evaluation today patient appears to be doing excellent in regard to her lower extremity on the left. She was in the hospital for several days from when I sent her last we saw her until just this past Tuesday. Fortunately her drainage is significantly improved and in fact is mostly clear. There is just a couple small areas that may still drain a little bit she states that the [facility] they prescribed for her at discharge she went picked up from pharmacy and got home but has not been able to find it since. She is looked everywhere. She is wondering if I will replace that for her today I will be more than happy to do that. 05/01/2019 on evaluation today patient actually appears to be doing quite well with regard to her lower extremities. She occasionally is having areas that will leak and then heal up mainly when a piece of the fibrotic skin pops off but fortunately she is not  having any signs of active infection at this time. Overall she also really does not have any obvious weeping at this time. I do believe however she really needs some compression wraps and I think this may be a good time to get her back to the lymphedema clinic. 05/11/2019 on evaluation today patient actually appears to be doing quite well with regard to her bilateral lower extremities. She occasionally will have a small area that we per another but in general seems to be completely healed which is great news. Overall very pleased with how everything seems to be progressing. She does have her appointment with lymphedema clinic on November 18. 05/25/2019 on evaluation today patient appears to be doing well with regard to her left lower extremity. I am very pleased in this regard. In regard to her right leg this actually did start draining more I think it is mainly due to the fact that her leg is more swollen. I am not seeing any obvious signs of infection at this time although that is definitely something were obviously acutely aware of simply due to the fact that she had an issue not too far back with exactly this issue. Nonetheless I do feel like that lymphedema clinic would still be beneficial for her. I explained obviously if they are not able to do anything treatment wise on the right leg we could at least have them treat her left leg and then proceed from there. The patient is really in agreement with that plan. If they are able to do both as the drainage slows down that I would be happy to let them handle both. [name], [name] ([id number]) [id number] Page 3 of 14 06/01/2019 on evaluation today patient unfortunately appears to be doing worse with regard to her right lower extremity. The left lower extremity is still maintaining at this point. Unfortunately she has been having significantly increased pain over the past several days and has been experiencing as  well increased swelling of the right lower extremity. I really do not know that I am seeing anything that appears to be obvious for infection at this point to be peripherally honest. With that being said the patient does seem to be having much more swelling that she is even experienced in the past and coupled with increased pain in her hip as well I am  concerned that again she could potentially have a DVT although I am not 100% sure of this. I think it something that may need to be checked out. We discussed the possibility of sending her for a DVT study through the hospital but unfortunately transportation is an issue if she does have a DVT I do not want her to wait days to be able to get in for that test however if she has this scheduled as an outpatient that is as fast that she will be able to get the test scheduled for transportation purposes. That will also fall on Thanksgiving so subsequently she did actually be looking at either Friday or even next week before we would know anything back from this. That is much too long in my opinion. Subsequent to the amount of discomfort she is experiencing the patient is actually okay with going to the ER for evaluation today. 06/12/2019 on evaluation today patient actually appears to be doing significantly better compared to last time I saw her. Following when I last saw her she was actually in the hospital from that Monday until the following Sunday almost 1 full week. She actually was placed on Keflex in the hospital following the time for her to be discharged and Dr. Steva Ready has recommended 2 times a day dosing of the Keflex for the next year in order to help with more prophylactic/preventative measures with regard to her developing cellulitis. Overall I think this sounds like an excellent plan. The patient unfortunately is good to have trouble being treated at lymphedema clinic due to the fact that she really cannot get up on the bed that they have  there. They also state that they cannot manage her as long as she has anything draining at this point. Obviously that is somewhat unfortunate as she does need help with edema control but nonetheless we will have to do what we can for her outside of it sounds like the lymphedema clinic scenario at this point. 06/19/2019 on evaluation today patient appears to be doing fairly well with regard to her bilateral lower extremities. She is not nearly as swollen and shows no signs of infection at this point. There is no evidence of cellulitis whatsoever. She also has no open wounds or draining at this point which is also good news. No fever chills noted. She seems to be in very good spirits and in fact appears to be doing quite well. READMISSION 11/27/2019 This is a 66 year old woman that we have had in this clinic several times before including 2015, 16 and 19 and then most recently from 03/20/2019 through 06/19/2019 with bilateral lower extremity lymphedema. She has had previous arterial and reflux studies done years ago which were not all that remarkable. 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 She does have a positive family history and she had large legs starting may be in her 62s. She was recently in hospital from 10/20/2019 through 10/28/2019 with right leg cellulitis. She was given Ancef and clindamycin and then Zosyn when a culture showed Pseudomonas. At that time there was purulent drainage. She was followed by infectious disease Dr Steva Ready. The patient is now back at home. She has noted increased swelling in the right and no drainage in her right leg mostly on the posterior medial aspect in the calf area. She has not had pain or fever. She has literally been improved lysing above dressings because her at the area of this is far  too large for standard compression. She has been wrapping the areas with sheets to resorptive pads. She is found these  helped somewhat. She does have an appointment with the lymphedema clinic in [place] in late June. Past medical history includes bilateral lymphedema, hypertension, obstructive sleep apnea with CPAP. Recent hospitalization with apparently Pseudomonas cellulitis of the right lower leg 12/15/2019 upon evaluation today patient appears to be doing a little bit worse in regard to her right lower extremity. Unfortunately she is having more weeping down in the lower portion of her leg. Fortunately there is no signs of active infection at this time. No fever chills noted. The patient states she is not having increased pain except for when she attempted to use the lymphedema pumps unfortunately she states that she did have pain when she did this. Otherwise we been using absorptive dressings of one type or another she is using diapers at home and then subsequently Ace wraps. In regard to the barrier cream we have discussed the possibility of derma cloud which she would like to try I do not have a problem with that. 12/22/2019 upon evaluation today patient actually appears to be doing better in regard to her leg ulcers at this point. Fortunately there does not appear to be any signs of active infection which is great news and I am extremely pleased with where things are progressing at this time. There is no sign of active infection currently. The patient is very pleased to see things doing so well. 12/29/2019 upon evaluation today patient appears to be doing a little bit better in regard to her weeping in general over her lower extremities. She does have some signs of mild erythema little bit more than what I noted last week or rather last visit. Nonetheless I think that my threshold for switching her antibiotics from Keflex to something else is very low at this point considering that she has had such severe infections in the past that seem to come almost out of nowhere. There is a little erythema and warmth  noted of the lower portion of her leg compared to the upper which also makes me want to go ahead and address things more rapidly at this point. Likely I would switch out the Keflex for something like Levaquin ideally. 7/16; patient with severe bilateral lymphedema. She has superficial wounds albeit almost circumferential now on the left lateral lower leg. This may be new from last time. Small area on the right anterior lower leg and then another area on the right medial lower leg and of pannus fold. She has been using various absorptive garments. She states she is using her compression pumps once a day occasionally twice. Culture from her last visit here was negative 01/29/2020 on evaluation today patient appears to be doing excellent at this point in regard to her legs with regard to infection I see no signs of active infection at this point. She still does have unfortunately areas of weeping this is minimal on the right now her left is actually significantly worse although I do not think it is as bad as last week with Dr. Dellia Nims saw her. She has been trying to pump and elevate her legs is much as possible. She has previously been on the Keflex and in the past for prevention that seems to do fairly well and likely can extend that today. 02/04/2020 on evaluation today patient appears to be doing better in regard to her legs bilaterally. Fortunately there is no signs of  active infection at this time which is great news and overall she has less weeping on the left compared to the right and there is several spots where she is pretty much sealed up with no draining regions. Overall very pleased in this regard. 02/19/2020 on evaluation today patient appears to be doing very well in regard to her wounds currently. Fortunately there is no evidence of active infection overall very pleased with where things stand. She is significantly improved in regard to her edema I am extremely pleased in this regard she tells  me that the popping no longer hurts and in fact she actually looks forward to it. 03/04/2020 on evaluation today patient appears to be doing excellent in regard to her lower extremities. Fortunately there is no signs of active infection at this time. No fevers, chills, nausea, vomiting, or diarrhea. 03/25/2020 on evaluation today patient appears to be doing a little bit more poorly in regard to her legs at this point. She tells me that she is still continue to have issues with drainage and this has been a little bit worse she was getting ready to start taking the Keflex again but wanted to see me first. Fortunately there is no signs of active infection at this time. No fevers, chills, nausea, vomiting, or diarrhea. 04/15/2020 upon evaluation today patient appears to be doing somewhat poorly in regard to her right leg. She tells me she has been having more pain she has been taking the Keflex that was previously prescribed unfortunately that just does not seem to help with this. She was hoping that the pain on her right was actually coming from the fact that she was having issues with her wrap having gotten caught in her recliner. With that being said she tells me that she knew something was not right. Currently her right leg is warm to touch along with being erythematous all the way up to around at least mid thigh as far as I can see. The left leg does not appear to be doing that badly though there is increased weeping around the ankle region. 05/06/2020 on evaluation today patient appears to be doing much better than last time I saw her. She did go to the hospital where she was admitted for 2 days and treated with antibiotic therapy. She was discharged with antibiotics as well and has done extremely well. I am extremely pleased with where things stand today. There is no signs of active infection at this time which is great news. 05/27/2020 upon evaluation today patient appears to be doing well with  regard to her lower extremities bilaterally. She has just a very tiny area on the right leg which is opening on the left leg she is significantly improved though she still has several areas that do appear to be open this is minimal compared to what is been in the past. In general I am extremely pleased with where things stand today. The patient does tell me she is not been using her pumps quite as much as [name], [name] ([id number]) [id number] Page 4 of 14 she should be. I do believe that is 1 area she can definitely work on. She has had a lot going on including a Covid exposure and apparently also a outbreak of likely shingles. 06/24/2020 upon evaluation today patient appears to be doing well with regard to her legs in general although the left leg unfortunately is showing some signs of erythema she does have a little bit of increased weeping and  to be honest I am concerned about infection here. I discussed that with her today and I think that we may need to address this sooner rather than later she has been taking Keflex she is not really certain that is been making a big improvement however. No fevers, chills, nausea, vomiting, or diarrhea. 06/30/2020 upon evaluation today patient's legs actually seem to be doing better in my opinion as compared to where they were last week. Fortunately there does not appear to be any signs of active infection. Her culture showed multiple organisms nothing predominate. With that being said the Levaquin seems to have done well I think she has improved since I last saw her as well. 07/14/2020 upon evaluation today patient appears to be doing actually better in regard to her lower extremities in my opinion. She has been tolerating the dressing changes without complication. Fortunately there is no signs of active infection at this time. 08/04/2020 upon evaluation today patient appears to be doing excellent in regard to her leg ulcers.  Fortunately she has very little that is open at this point. This is great news. In fact I think that the Goldbond medicated powder has been excellent for her. It seems to have done the trick where we had tried several other things without as much success. Fortunately there is no evidence of active infection at this time. No fevers, chills, nausea, vomiting, or diarrhea. 09/01/2020 upon evaluation today patient appears to be doing a little bit more poorly than the last time I saw her. She tells me right now that she has been having a lot of drainage compared to where things were previous which has unfortunately led to more irritation as well. She is concerned that this is leading to infection. Again based on what I am seeing today as well I am also concerned of the same to be honest. 09/15/2020 upon evaluation today patient appears to be doing well with regard to her legs compared to what I saw previous. Fortunately there does not appear to be any evidence of active infection at this time which is great news. At least not as badly as what it was previous. With that being said I do believe that the patient does need to have an extension of the antibiotics. This I believe will actually help her more in the way of making sure this stays under control and does not worsen. That was the Bactrim DS. Readmission: 08/01/2021 upon evaluation today patient appears to be doing actually pretty well all things considered. Is actually been almost a year since have seen her last March. Fortunately I do not see any evidence of active infection locally nor systemically at this point. She does have a couple open areas on the left leg the right leg appears to be doing quite well. In general she has been in the hospital 3 times since I saw her a year ago all 4 cellulitis type issues except for the last 1 which was actually more related to congestive heart failure for that reason she is not using her lymphedema pumps at this  point and that is probably the best idea. 08/08/2021 upon evaluation today patient appears to be doing well with regard to her legs all things considered her left leg may be a little bit more swollen based on what I see today. Fortunately I do not see any signs of active infection locally nor systemically at this time which is great news. No fevers, chills, nausea, vomiting, or diarrhea. 08/15/2021 upon  evaluation today patient actually appears to be doing excellent in regard to her wounds. She has been tolerating the dressing changes without complication. Fortunately I see no evidence of infection currently which is great news. No fevers, chills, nausea, vomiting, or diarrhea. 08/29/2021 upon evaluation patient appears to be doing excellent she in fact is almost completely healed. I am actually very pleased with where we stand today and I think that she is making wonderful progress she just has a small area of weeping on the right lateral leg everything else is pretty much completely healed which is also. 09/05/2021 upon evaluation today patient appears to be doing well with regard to her legs. She does have a small area of weeping on the right leg and there is a small area on the left leg as well. It is potentially something that may need to be addressed in the future more specifically but right now I think that there may have just been a little drainage here on the left. With all that being said I think that this still does not appear to be infected and overall I feel like she is doing quite well. That something we always have to keep a very close eye on as she can change very rapidly from okay to not okay. 09/12/2021 upon evaluation today patient appears to be doing a little bit worse in regard to her leg and actually somewhat concerned about the possibility of infection here. I discussed that with the patient. For that reason I am going to go ahead and see about getting her set up for a repeat  prescription for the Bactrim. She is in agreement with that plan. 3/14; patient presents for follow-up. She has been using silver alginate with dressing changes. She is still taking Bactrim prescribed at last clinic visit. She has no issues or complaints today. She has lymphedema pumps however does not use these. 09/26/2020 upon evaluation today patient appears to actually be doing well in regard to her right leg I am pleased in that regard. Unfortunately she has new areas on her left leg that are open at this point that I do need to be addressed. I am also concerned about the warmth around one of the wounds on the more anterior side. I think this is something that we may need to culture today potentially requiring antibiotics going forward. 10/03/2021 upon evaluation today patient unfortunately is not doing nearly as well as she was even last week with her wounds. She is having some issues here with increased cellulitis of the left lower extremity unfortunately. I did prescribe Augmentin for her eczema prescribed last week unfortunately she never actually received this prescription. The pharmacy stated they never got it. We double checked with them today they still did not receive it. I am not sure what happened in that regard. With that being said the patient is in good spirits today she does not appear to be as sick as where she normally is when the cellulitis gets significantly worse as it has in the past. Nonetheless the leg is much more red not just around the ankle where I was seeing at last week on Tuesday but rather now this is going all the way up to almost her knee and is definitely hot to touch compared to the right leg. Nonetheless I feel like she does need to go to the ER for likely IV antibiotics. 10-10-2021 upon evaluation today patient appears to be doing well with regard to her wound. She  has been tolerating the dressing changes that appears to be doing much better. After I saw her last  week she did go to the ER they admitted her to the hospital and gave her IV antibiotics she fortunately is doing significantly better. Overall I am extremely pleased with where things stand today. 10-17-2021 upon evaluation today patient appears to be doing well with regard to her wound. In fact this is looking better and her leg is looking better but she still does have some areas here of erythema. We will continue to address this as soon as possible in my opinion. I do believe she may require some debridement and what appears to be a more defined wound on her left leg at this point. 10-24-2021 upon evaluation patient is definitely showing signs of improvement which is great news. I do not see any evidence of active infection locally or systemically which is great news as well. No fevers, chills, nausea, vomiting, or diarrhea. 10-31-2021. Upon evaluation today patient's leg actually feels better from an infection and wound care perspective. I am actually very pleased in this regard. Unfortunately the biggest issue that I see at this time is that the patient is having a significant issue with her breathing. I did put her on the pulse ox machine to check her oxygen saturation and it was pretty much at 100% which is good but she tells me that when she is walking this is much more difficult for her. She also tells me that when she is trying to bend over to perform her dressing changes she is so short of breath due to the swelling and fluid in her abdominal area that she is just not been able to do this. She is tearful and very concerned today to be honest. I am truly sorry to see her this way I know she is really having a hard time at the moment. 11-14-2021 upon evaluation patient actually appears to be doing significantly better compared to when I last saw her. She was actually admitted to the hospital I believe this for 6 days total after I sent her on 10-31-2021. Subsequently they actually pulled off greater  than 50 pounds of fluid she tells me she feels significantly better her legs are not as tight she actually has some play in the tissue and it is obvious that she is doing much better just from a mobility KAETLYN, NOA ([id number]) [id number] Page 5 of 14 standpoint as well as a breathing standpoint. Overall I am extremely happy for her and how she is doing the leg also looks much better on the left. 11-21-2021 upon evaluation today patient appears to be doing well although it does appear that she is going require some sharp debridement the wound is very dry this is the opposite of what its been she was having so much weeping that it was staying extremely wet. Nonetheless we do need to see about going ahead and debriding the wound today. 11-28-2021 upon evaluation today patient appears to be doing well with regard to her wound I definitely see signs of things continuing to improve which is great news. I do not see any evidence of active infection locally or systemically also great news. 12-05-2021 upon evaluation today patient appears to be doing well with regard to her wound. She has been tolerating the dressing changes. Fortunately there does not appear to be any signs of active infection locally or systemically at this time. No fevers, chills, nausea, vomiting, or diarrhea. 12-12-2021  upon evaluation patient appears to be doing well with regard to her wound this is actually measuring smaller and looking much better. Fortunately I do not see any signs of active infection locally or systemically at this time which is excellent news. 6/13; small wound area in the folds of her lymphedema just above the left ankle. Fortunately the area looks quite good. She has been using silver alginate ABDs and an Ace wrap which she is able to change her self. She is not using her compression pumps out of fear that this could contribute to heart failure. 12-26-2021 upon evaluation patient's  wounds are actually showing signs of excellent improvement. I am very pleased with where things stand. Overall I think that she has been making excellent progress and I think we are very close to complete resolution which is great news. 01-11-2022 upon evaluation today patient appears to be doing well currently in regard to her legs in general although on the left leg she does have 1 area that still irritated and inflamed on the anterior portion of her leg. Fortunately I do not see any signs of systemic infection locally there might be some infection going on here however which is my biggest concern. Fortunately I do not see any evidence though of this spreading to any other location. 01-16-2022 upon evaluation today patient has actually showing signs of excellent improvement. Fortunately I do not see any evidence of infection locally or systemically which is great news and overall I am very pleased with where we stand at this point. 01-25-2022 upon evaluation today patient actually appears to be doing decently well in regard to her legs. She has been tolerating the dressing changes without complication. Fortunately there does not appear to be any evidence of active infection locally or systemically which is great news and overall I am extremely pleased with where we stand at this point. 02-13-2022 upon evaluation today patient appears to be doing well currently in regard to her wound which is actually showing signs of significant improvement. Fortunately I do not see any evidence of active infection locally or systemically at this time. No fevers, chills, nausea, vomiting, or diarrhea. 02-20-2022 upon evaluation today patient appears to be doing well currently in regard to her wound. She has been tolerating the dressing changes without complication. Fortunately there does not appear to be any signs of active infection locally or systemically at this time. No fevers, chills, nausea, vomiting,  or diarrhea. 02-27-2022 upon evaluation today patient appears to be doing excellent in regard to her wounds on the leg. She has been tolerating the dressing changes without complication this is very close to complete resolution. Fortunately I do not see any signs of infection locally or systemically at this time which is great news. No fevers, chills, nausea, vomiting, or diarrhea. 03-06-2022 upon evaluation today patient appears to be doing well currently in regard to her wound in general. She has been tolerating the dressing changes without complication. Fortunately I do not see any evidence of active infection locally or systemically at this time which is great news. I am concerned a little bit however about not really her wounds but the second toe right foot where there appears to be some cellulitis after she dropped a frying pan on this about 2 weeks ago. She tells me its been itching and burning and I do think that it is possible she may have an infection based on what we are seeing currently. 03-20-2022 upon evaluation today patient actually appears  to be doing much better at this point. Fortunately there does not appear to be any signs of active infection locally or systemically at this time which is great news. No fevers, chills, nausea, vomiting, or diarrhea. 03-27-2022 upon evaluation today patient appears to be doing somewhat poorly in regard to her legs compared to previous. Her right leg has a couple areas that are open and she is very warm and painful to touch around the lateral portion of the ankle area. Left leg is showing signs of little bit more drainage and weeping as well I think that this is probably due to the fact that she is swelling a lot more that she has been in the past. Fortunately I do not see any signs of active infection systemically though locally there is definitely some issues here. 04-03-2022 upon evaluation today patient appears to be doing poorly in regard to her  wounds on the legs unfortunately. She also seems to have more significant cellulitis that has ensued. Unfortunately she has been on doxycycline initially it seemed to be helping but as of this week that is no longer the case. It started getting worse last week and with the reinitiation of the doxycycline nothing has improved. Often when she gets like this IV antibiotics are necessary. 04-19-2022 upon evaluation today patient appears to be doing well currently in regard to her legs from an infection standpoint. She did get IV vancomycin in the hospital and states that her legs did get better when she left she was no longer weeping but she had been in the bed for 4 days straight. With that being said since coming home her legs have begun to weep again the original wound that we were dealing with has healed she does have some weeping over both lower extremities however which is still of concern. 05-03-2022 upon evaluation today patient appears to be doing well currently in regard to her left leg which I feel like is a little bit better unfortunately the right leg is still draining significantly. Fortunately I do not see any signs of infection locally or systemically at this time. No fevers, chills, nausea, vomiting, or diarrhea. 05-10-2022 unfortunately patient actually showed signs of increasing erythema today. I believe that the cellulitis never completely resolved from when she was in the hospital last. Again she has been on doxycycline previously by myself I gave her Keflex more recently as an ongoing prescription to try to prevent infection but again I do not think this is strong enough to be able to get this under control. I really feel like that she is probably can need IV antibiotics and a good period of time in the hospital to be able to keep her legs elevated and not having to get around to do a whole lot in order to allow this to resolve. The patient voiced understanding. Electronic  Signature(s) Signed: 05/10/2022 11:55:30 AM By: Worthy Keeler PA-C Entered By: Worthy Keeler on 05/10/2022 11:55:30 [name] ([id number]) [id number] Page 6 of 14 -------------------------------------------------------------------------------- Physical Exam Details Patient Name: Date of Service: [name], [name] 05/10/2022 10:30 A M Medical Record Number: [id number] Patient Account Number: [id number] Date of Birth/Sex: Treating RN: October 15, 1955 (66 y.o. [name] Primary Care Provider: Ranae Plumber Other Clinician: Massie Kluver Referring Provider: Treating Provider/Extender: Skeet Simmer in Treatment: 52 Constitutional Well-nourished and well-hydrated in no acute distress. Respiratory normal breathing without difficulty. Psychiatric this patient is able to make decisions and demonstrates good insight  into disease process. Alert and Oriented x 3. pleasant and cooperative. Notes Upon inspection patient's is actually appear to be more cellulitic appearing with erythematous leg skin noted which has previously not been an issue to this degree it seems to be worsened last week and very concerned that she probably needs to go back to the hospital to try to get this under control as I do not think the oral antibiotics are doing the job to be honest. Electronic Signature(s) Signed: 05/10/2022 11:55:52 AM By: Worthy Keeler PA-C Entered By: Worthy Keeler on 05/10/2022 11:55:52 -------------------------------------------------------------------------------- Physician Orders Details Patient Name: Date of Service: [name] 05/10/2022 10:30 A M Medical Record Number: [id number] Patient Account Number: [id number] Date of Birth/Sex: Treating RN: 1955/10/31 (66 y.o. 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 Nurse Visit as needed CISSY, GALBREATH ([id number]) [id number] Page 7 of 14 Bathing/ Shower/ Hygiene Clean wound with Normal Saline or wound cleanser. Wash wounds with antibacterial soap and water. May shower; gently cleanse wound with antibacterial soap, rinse and pat dry prior to dressing wounds No tub bath. Edema Control - Lymphedema / Segmental Compressive Device / Other A wraps ce Elevate, Exercise Daily and A void Standing for Long Periods of Time. Elevate leg(s) parallel to the floor when sitting. DO YOUR BEST to sleep in the bed at night. DO NOT sleep in your recliner. Long hours of sitting in a recliner leads to swelling of the legs and/or potential wounds on your backside. Other: - use lymphedema pumps Non-Wound Condition Bilateral Lower Extremities Cleanse affected area with antibacterial soap and water, pply appropriate compression. - use abdominal pads, and chuck pads, them ace wrap to secure A dditional non-wound orders/instructions: - silver cell to open weeping areas A Medications-Please add to medication list. ntibiotics - continue cephalexin as directed P.O. A Notes instructed patient to go to ED for treatment of cellulitis bilat lower legs Electronic Signature(s) Signed: 05/10/2022 5:42:41 PM By: Worthy Keeler PA-C Signed: 05/15/2022 5:15:48 PM By: Massie Kluver Entered By: Massie Kluver on 05/10/2022 11:47:23 -------------------------------------------------------------------------------- Problem List Details Patient Name: Date of Service: [name] 05/10/2022 10:30 A M Medical Record Number: [id number] Patient Account Number: [id number] Date of Birth/Sex: Treating RN: June 29, 1956 (66 y.o. 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 08/01/2021 No Yes of skin L97.821 Non-pressure chronic ulcer of other part of left lower leg limited to breakdown 08/01/2021 No Yes of skin Inactive Problems Resolved Problems [name], [name] ([id number]) [id number] Page 8 of 14 Electronic Signature(s) Signed: 05/10/2022 11:30:01 AM By: Worthy Keeler PA-C Entered By: Worthy Keeler on 05/10/2022 11:30:01 -------------------------------------------------------------------------------- Progress Note Details Patient Name: Date of Service: DAMYIAH, MOXLEY 05/10/2022 10:30 A M Medical Record Number: [id number] Patient Account Number: [id number] Date of Birth/Sex: Treating RN: [birth year] (  66 y.o. [name] Primary Care Provider: Ranae Plumber Other Clinician: Massie Kluver Referring Provider: Treating Provider/Extender: Skeet Simmer in Treatment: 13 Subjective Chief Complaint Information obtained from Patient Bilateral LE lymphedema with Left LE Ulcers History of Present Illness (HPI) The patient is a 66 year old female with history of hypertension and a long-standing history of bilateral lower extremity lymphedema (first presented on 4/2) . She has had open ulcers in the past which have always responded to compression therapy. She had briefly been to a lymphedema clinic in the past which helped her at the time. this time around she stopped  treatment of her lymphedema pumps approximately 2 weeks ago because of some pain in the knees and then noticed the right leg getting worse. She was seen by her PCP who put her on clindamycin 4 times a day 2 days ago. The patient has seen [facility] and Dr. Delana Meyer had seen her last year where a vascular study including venous and arterial duplex studies were within normal limits. he had recommended compression stockings and lymphedema pumps and the patient has been using this in about 2 weeks ago. She is known to be diabetic but in the past few time she's gone to her primary care doctor her hemoglobin A1c has been normal. 02/11/2015 - after her last visit she took my advice and went to the ER regarding the progressive cellulitis of her right lower extremity and she was admitted between July 17 and 22nd. She received IV antibiotics and then was sent home on a course of steroid-induced and oral antibiotics. She has improved much since then. 02/17/2015 -- she has been doing fine and the weeping of her legs has remarkably gone down. She has no fresh issues. READMISSION 01/15/18 This patient was given this clinic before most recently in 2016 seen by Dr. Con Memos. She has massive bilateral lymphedema and over the last 2 months this had weeping edema out of the left leg. She has compression pumps but her compliance with these has been minimal. She has [facility] they've been using TCA/ABDs/kerlix under an Ace wrap.she has had recent problems with cellulitis. She was apparently seen in the ER and 12/23/17 and given clindamycin. She was then followed by her primary doctor and given doxycycline and Keflex. The pain seems to have settled down. In April 2018 the patient had arterial studies done at [facility]. This showed triphasic waveforms throughout the right leg and mostly triphasic waveforms on the left except for monophasic at the posterior tibial artery distally. She was not felt to have  evidence of right lower extremity arterial stenosis or significant problems on the left side. 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 She also had a right lower extremity venous Doppler in January 2018 this was limited by the patient's body habitus and lymphedema. Most of the proximal veins were not visualized The patient presents with an area of denuded skin on the anterior medial part of the left calf. There is weeping edema fluid here. 01/22/18; the patient has somewhat better edema control using her compression pumps twice a day and as a result she has much better epithelialization on the left anterior calf area. Only a small open area remains. 01/29/18; the patient has been compliant with her compression pumps. Both the areas on her calf that healed. The remaining area on the left anterior leg is fully epithelialized Readmission: 02/20/2019 upon evaluation today patient presents for reevaluation due to issues that  she is having with the bilateral lower extremities. She actually has wounds open on both legs. On the right she has an area in the crease of her leg on the right around the knee region which is actually draining quite a bit and actually has some fungal type appearance to it. She has been on nystatin powder that seems to have helped to some degree. In regard to the left lower extremity this is actually in the lower portion of her leg closer to the ankle and again is continuing to drain as well unfortunately. There does not appear to be any signs of active infection at this time which is good news. No fevers, chills, nausea, vomiting, or diarrhea. She tells me that since she was seen last year she is actually been doing quite well for the most part with regard to her lower extremities. Unfortunately she now is experiencing a little bit more drainage at this time. She is concerned about getting this under control so that it does not get significantly  worse. 02/27/2019 on evaluation today patient appears to be doing somewhat better in regard to her bilateral lower extremity wounds. She has been tolerating the dressing changes without complication. Fortunately there is no signs of active infection at this point. No fevers, chills, nausea, vomiting, or diarrhea. She did get her dressing supplies which is excellent news she was extremely excited to get these. She also got paperwork from [employer] for their financial assistance [name], [name] ([id number]) [id number] Page 9 of 14 program where they may be able to help her out in the future if needed with supplies at discounted prices. 03/06/2019 on evaluation today patient appears to be doing a little worse with regard to both areas of weeping on her bilateral lower extremities. This is around the right medial knee and just above the left ankle. With that being said she is unfortunately not doing as well as I would like to see. I feel like she may need to potentially go see someone at the lymphedema clinic as the wraps that she needs or even beyond what we can do here at the wound care center. She really does not have wounds she just has open areas of weeping that are causing some difficulty for her. Subsequently because of this and the moisture I am concerned about the potential for infection I am going to likely give her a prophylactic antibiotic today, Keflex, just to be on the safe side. Nonetheless again there is no obvious signs of active infection at this time. 03/13/2019 on evaluation today patient appears to be doing well with regard to her bilateral lower extremities where she has been weeping compared to even last week's evaluation. I see some areas of new skin growth which is excellent and overall I am very pleased with how things seem to be progressing. No fevers, chills, nausea, vomiting, or diarrhea. 03/20/2019 on evaluation today patient unfortunately is continuing  to have issues with significant edema of the left lower extremity. Her right side seems to be doing much better. Unfortunately her left side is showing increased weeping of the lower portion of her leg. This is quite unfortunate obviously we were hoping to get her into the lymphedema clinic they really do not seem to when I see her how if she is draining. Despite the fact this is really not wound related but more lymphedema weeping related. Nonetheless I do not know that this can be helpful for her to even go for that appointment  since again I am not sure there is much that they would actually do at this point. We may need to try a 4 layer compression wrap as best we can on her leg. She is on the Augmentin currently although I am still concerned about whether or not there could be potentially something going on infection wise I would obtain a culture though I understand is not the best being that is a surface culture I just 1 to make sure I do not seem to be missing anything. 03/27/2019 on evaluation today patient appears to be doing much better in regard to the left lower extremity compared to last week. Last week she had tremendous weeping which I think was subsequent to infection now she seems to be doing much better and very pleased. This is not completely healed but there is a lot of new skin growth and it has dried out quite a bit. Overall I think that we are doing well with how things are moving along at this time. No fevers, chills, nausea, vomiting, or diarrhea. 04/03/2019 evaluation today patient appears to be doing a little worse this week compared to last time I saw her. I think this may be due to the fact that she is having issues with not being able to sleep in her bed at least not until last night. She is therefore been in a lift chair and subsequently has also had issues with not been able to use her pumps since she could not get in bed. With that being said the patient overall seems to be  doing okay I do think I may want extend the antibiotic for a little bit longer at least until we can see if her edema and her weeping gets better and if it is then obviously I can always discontinue the antibiotics as of next week however I want her to continue to have it over the next week. 04/10/2019 on evaluation today patient unfortunately is still doing poorly with regard to her left lower extremity. Her right is all things considering doing fairly well. On the left however she continues to have spreading of the area of infection and weeping which appears to be even a larger surface area than noted last week. She did have a positive culture for Pseudomonas in particular which seems to have been of concern she still has green/yellow discharge consistent with Pseudomonas and subsequently a tremendous amount of it. This has me obviously still concerned about the infection not really clearing up despite the fact that on culture it appears the Cipro should have been a good option for treating this. I think she may at this point need IV antibiotics since things are not doing better I do not want to get worse and cause sepsis. She is in agreement with the plan and believes as well that she likely does need to go to the hospital for IV vancomycin. Or something of the like depending on what the recommendation is from the ER. 04/17/2019 on evaluation today patient appears to be doing excellent in regard to her lower extremity on the left. She was in the hospital for several days from when I sent her last we saw her until just this past Tuesday. Fortunately her drainage is significantly improved and in fact is mostly clear. There is just a couple small areas that may still drain a little bit she states that the Dailey they prescribed for her at discharge she went picked up from pharmacy and got home  but has not been able to find it since. She is looked everywhere. She is wondering if I will replace that for  her today I will be more than happy to do that. 05/01/2019 on evaluation today patient actually appears to be doing quite well with regard to her lower extremities. She occasionally is having areas that will leak and then heal up mainly when a piece of the fibrotic skin pops off but fortunately she is not having any signs of active infection at this time. Overall she also really does not have any obvious weeping at this time. I do believe however she really needs some compression wraps and I think this may be a good time to get her back to the lymphedema clinic. 05/11/2019 on evaluation today patient actually appears to be doing quite well with regard to her bilateral lower extremities. She occasionally will have a small area that we per another but in general seems to be completely healed which is great news. Overall very pleased with how everything seems to be progressing. She does have her appointment with lymphedema clinic on November 18. 05/25/2019 on evaluation today patient appears to be doing well with regard to her left lower extremity. I am very pleased in this regard. In regard to her right leg this actually did start draining more I think it is mainly due to the fact that her leg is more swollen. I am not seeing any obvious signs of infection at this time although that is definitely something were obviously acutely aware of simply due to the fact that she had an issue not too far back with exactly this issue. Nonetheless I do feel like that lymphedema clinic would still be beneficial for her. I explained obviously if they are not able to do anything treatment wise on the right leg we could at least have them treat her left leg and then proceed from there. The patient is really in agreement with that plan. If they are able to do both as the drainage slows down that I would be happy to let them handle both. 06/01/2019 on evaluation today patient unfortunately appears to be doing worse with  regard to her right lower extremity. The left lower extremity is still maintaining at this point. Unfortunately she has been having significantly increased pain over the past several days and has been experiencing as well increased swelling of the right lower extremity. I really do not know that I am seeing anything that appears to be obvious for infection at this point to be peripherally honest. With that being said the patient does seem to be having much more swelling that she is even experienced in the past and coupled with increased pain in her hip as well I am concerned that again she could potentially have a DVT although I am not 100% sure of this. I think it something that may need to be checked out. We discussed the possibility of sending her for a DVT study through the hospital but unfortunately transportation is an issue if she does have a DVT I do not want her to wait days to be able to get in for that test however if she has this scheduled as an outpatient that is as fast that she will be able to get the test scheduled for transportation purposes. That will also fall on Thanksgiving so subsequently she did actually be looking at either Friday or even next week before we would know anything back from this. That is much  too long in my opinion. Subsequent to the amount of discomfort she is experiencing the patient is actually okay with going to the ER for evaluation today. 06/12/2019 on evaluation today patient actually appears to be doing significantly better compared to last time I saw her. Following when I last saw her she was actually in the hospital from that Monday until the following Sunday almost 1 full week. She actually was placed on Keflex in the hospital following the time for her to be discharged and Dr. Steva Ready has recommended 2 times a day dosing of the Keflex for the next year in order to help with more prophylactic/preventative measures with regard to her developing  cellulitis. Overall I think this sounds like an excellent plan. The patient unfortunately is good to have trouble being treated at lymphedema clinic due to the fact that she really cannot get up on the bed that they have there. They also state that they cannot manage her as long as she has anything draining at this point. Obviously that is somewhat unfortunate as she does need help with edema control but nonetheless we will have to do what we can for her outside of it sounds like the lymphedema clinic scenario at this point. 06/19/2019 on evaluation today patient appears to be doing fairly well with regard to her bilateral lower extremities. She is not nearly as swollen and shows no signs of infection at this point. There is no evidence of cellulitis whatsoever. She also has no open wounds or draining at this point which is also good news. No fever chills noted. She seems to be in very good spirits and in fact appears to be doing quite well. READMISSION 11/27/2019 This is a 66 year old woman that we have had in this clinic several times before including 2015, 16 and 19 and then most recently from 03/20/2019 through 06/19/2019 with bilateral lower extremity lymphedema. She has had previous arterial and reflux studies done years ago which were not all that remarkable. 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 She does have a positive family history and she had large legs starting may be in her 66s. She was recently in hospital from 10/20/2019 through 10/28/2019 with right leg cellulitis. She was given Ancef and clindamycin and then Zosyn when a culture showed Pseudomonas. At that time there was purulent drainage. She was followed by infectious disease Dr Steva Ready. [name], [name] ([id number]) [id number] Page 10 of 14 The patient is now back at home. She has noted increased swelling in the right and no drainage in her right  leg mostly on the posterior medial aspect in the calf area. She has not had pain or fever. She has literally been improved lysing above dressings because her at the area of this is far too large for standard compression. She has been wrapping the areas with sheets to resorptive pads. She is found these helped somewhat. She does have an appointment with the lymphedema clinic in [place] in late June. Past medical history includes bilateral lymphedema, hypertension, obstructive sleep apnea with CPAP. Recent hospitalization with apparently Pseudomonas cellulitis of the right lower leg 12/15/2019 upon evaluation today patient appears to be doing a little bit worse in regard to her right lower extremity. Unfortunately she is having more weeping down in the lower portion of her leg. Fortunately there is no signs of active infection at this time. No fever chills noted. The patient states she is not having increased pain  except for when she attempted to use the lymphedema pumps unfortunately she states that she did have pain when she did this. Otherwise we been using absorptive dressings of one type or another she is using diapers at home and then subsequently Ace wraps. In regard to the barrier cream we have discussed the possibility of derma cloud which she would like to try I do not have a problem with that. 12/22/2019 upon evaluation today patient actually appears to be doing better in regard to her leg ulcers at this point. Fortunately there does not appear to be any signs of active infection which is great news and I am extremely pleased with where things are progressing at this time. There is no sign of active infection currently. The patient is very pleased to see things doing so well. 12/29/2019 upon evaluation today patient appears to be doing a little bit better in regard to her weeping in general over her lower extremities. She does have some signs of mild erythema little bit more than what I noted  last week or rather last visit. Nonetheless I think that my threshold for switching her antibiotics from Keflex to something else is very low at this point considering that she has had such severe infections in the past that seem to come almost out of nowhere. There is a little erythema and warmth noted of the lower portion of her leg compared to the upper which also makes me want to go ahead and address things more rapidly at this point. Likely I would switch out the Keflex for something like Levaquin ideally. 7/16; patient with severe bilateral lymphedema. She has superficial wounds albeit almost circumferential now on the left lateral lower leg. This may be new from last time. Small area on the right anterior lower leg and then another area on the right medial lower leg and of pannus fold. She has been using various absorptive garments. She states she is using her compression pumps once a day occasionally twice. Culture from her last visit here was negative 01/29/2020 on evaluation today patient appears to be doing excellent at this point in regard to her legs with regard to infection I see no signs of active infection at this point. She still does have unfortunately areas of weeping this is minimal on the right now her left is actually significantly worse although I do not think it is as bad as last week with Dr. Dellia Nims saw her. She has been trying to pump and elevate her legs is much as possible. She has previously been on the Keflex and in the past for prevention that seems to do fairly well and likely can extend that today. 02/04/2020 on evaluation today patient appears to be doing better in regard to her legs bilaterally. Fortunately there is no signs of active infection at this time which is great news and overall she has less weeping on the left compared to the right and there is several spots where she is pretty much sealed up with no draining regions. Overall very pleased in this  regard. 02/19/2020 on evaluation today patient appears to be doing very well in regard to her wounds currently. Fortunately there is no evidence of active infection overall very pleased with where things stand. She is significantly improved in regard to her edema I am extremely pleased in this regard she tells me that the popping no longer hurts and in fact she actually looks forward to it. 03/04/2020 on evaluation today patient appears to be  doing excellent in regard to her lower extremities. Fortunately there is no signs of active infection at this time. No fevers, chills, nausea, vomiting, or diarrhea. 03/25/2020 on evaluation today patient appears to be doing a little bit more poorly in regard to her legs at this point. She tells me that she is still continue to have issues with drainage and this has been a little bit worse she was getting ready to start taking the Keflex again but wanted to see me first. Fortunately there is no signs of active infection at this time. No fevers, chills, nausea, vomiting, or diarrhea. 04/15/2020 upon evaluation today patient appears to be doing somewhat poorly in regard to her right leg. She tells me she has been having more pain she has been taking the Keflex that was previously prescribed unfortunately that just does not seem to help with this. She was hoping that the pain on her right was actually coming from the fact that she was having issues with her wrap having gotten caught in her recliner. With that being said she tells me that she knew something was not right. Currently her right leg is warm to touch along with being erythematous all the way up to around at least mid thigh as far as I can see. The left leg does not appear to be doing that badly though there is increased weeping around the ankle region. 05/06/2020 on evaluation today patient appears to be doing much better than last time I saw her. She did go to the hospital where she was admitted for 2  days and treated with antibiotic therapy. She was discharged with antibiotics as well and has done extremely well. I am extremely pleased with where things stand today. There is no signs of active infection at this time which is great news. 05/27/2020 upon evaluation today patient appears to be doing well with regard to her lower extremities bilaterally. She has just a very tiny area on the right leg which is opening on the left leg she is significantly improved though she still has several areas that do appear to be open this is minimal compared to what is been in the past. In general I am extremely pleased with where things stand today. The patient does tell me she is not been using her pumps quite as much as she should be. I do believe that is 1 area she can definitely work on. She has had a lot going on including a Covid exposure and apparently also a outbreak of likely shingles. 06/24/2020 upon evaluation today patient appears to be doing well with regard to her legs in general although the left leg unfortunately is showing some signs of erythema she does have a little bit of increased weeping and to be honest I am concerned about infection here. I discussed that with her today and I think that we may need to address this sooner rather than later she has been taking Keflex she is not really certain that is been making a big improvement however. No fevers, chills, nausea, vomiting, or diarrhea. 06/30/2020 upon evaluation today patient's legs actually seem to be doing better in my opinion as compared to where they were last week. Fortunately there does not appear to be any signs of active infection. Her culture showed multiple organisms nothing predominate. With that being said the Levaquin seems to have done well I think she has improved since I last saw her as well. 07/14/2020 upon evaluation today patient appears to be doing  actually better in regard to her lower extremities in my opinion. She has  been tolerating the dressing changes without complication. Fortunately there is no signs of active infection at this time. 08/04/2020 upon evaluation today patient appears to be doing excellent in regard to her leg ulcers. Fortunately she has very little that is open at this point. This is great news. In fact I think that the Goldbond medicated powder has been excellent for her. It seems to have done the trick where we had tried several other things without as much success. Fortunately there is no evidence of active infection at this time. No fevers, chills, nausea, vomiting, or diarrhea. 09/01/2020 upon evaluation today patient appears to be doing a little bit more poorly than the last time I saw her. She tells me right now that she has been having a lot of drainage compared to where things were previous which has unfortunately led to more irritation as well. She is concerned that this is leading to infection. Again based on what I am seeing today as well I am also concerned of the same to be honest. 09/15/2020 upon evaluation today patient appears to be doing well with regard to her legs compared to what I saw previous. Fortunately there does not appear to be any evidence of active infection at this time which is great news. At least not as badly as what it was previous. With that being said I do believe that the patient does need to have an extension of the antibiotics. This I believe will actually help her more in the way of making sure this stays under control and does not worsen. That was the Bactrim DS. Readmission: 08/01/2021 upon evaluation today patient appears to be doing actually pretty well all things considered. Is actually been almost a year since have seen her last [name], [name] ([id number]) [id number] Page 11 of 14 March. Fortunately I do not see any evidence of active infection locally nor systemically at this point. She does have a couple open areas on the  left leg the right leg appears to be doing quite well. In general she has been in the hospital 3 times since I saw her a year ago all 4 cellulitis type issues except for the last 1 which was actually more related to congestive heart failure for that reason she is not using her lymphedema pumps at this point and that is probably the best idea. 08/08/2021 upon evaluation today patient appears to be doing well with regard to her legs all things considered her left leg may be a little bit more swollen based on what I see today. Fortunately I do not see any signs of active infection locally nor systemically at this time which is great news. No fevers, chills, nausea, vomiting, or diarrhea. 08/15/2021 upon evaluation today patient actually appears to be doing excellent in regard to her wounds. She has been tolerating the dressing changes without complication. Fortunately I see no evidence of infection currently which is great news. No fevers, chills, nausea, vomiting, or diarrhea. 08/29/2021 upon evaluation patient appears to be doing excellent she in fact is almost completely healed. I am actually very pleased with where we stand today and I think that she is making wonderful progress she just has a small area of weeping on the right lateral leg everything else is pretty much completely healed which is also. 09/05/2021 upon evaluation today patient appears to be doing well with regard to her legs. She does have a  small area of weeping on the right leg and there is a small area on the left leg as well. It is potentially something that may need to be addressed in the future more specifically but right now I think that there may have just been a little drainage here on the left. With all that being said I think that this still does not appear to be infected and overall I feel like she is doing quite well. That something we always have to keep a very close eye on as she can change very rapidly from okay to not  okay. 09/12/2021 upon evaluation today patient appears to be doing a little bit worse in regard to her leg and actually somewhat concerned about the possibility of infection here. I discussed that with the patient. For that reason I am going to go ahead and see about getting her set up for a repeat prescription for the Bactrim. She is in agreement with that plan. 3/14; patient presents for follow-up. She has been using silver alginate with dressing changes. She is still taking Bactrim prescribed at last clinic visit. She has no issues or complaints today. She has lymphedema pumps however does not use these. 09/26/2020 upon evaluation today patient appears to actually be doing well in regard to her right leg I am pleased in that regard. Unfortunately she has new areas on her left leg that are open at this point that I do need to be addressed. I am also concerned about the warmth around one of the wounds on the more anterior side. I think this is something that we may need to culture today potentially requiring antibiotics going forward. 10/03/2021 upon evaluation today patient unfortunately is not doing nearly as well as she was even last week with her wounds. She is having some issues here with increased cellulitis of the left lower extremity unfortunately. I did prescribe Augmentin for her eczema prescribed last week unfortunately she never actually received this prescription. The pharmacy stated they never got it. We double checked with them today they still did not receive it. I am not sure what happened in that regard. With that being said the patient is in good spirits today she does not appear to be as sick as where she normally is when the cellulitis gets significantly worse as it has in the past. Nonetheless the leg is much more red not just around the ankle where I was seeing at last week on Tuesday but rather now this is going all the way up to almost her knee and is definitely hot to touch  compared to the right leg. Nonetheless I feel like she does need to go to the ER for likely IV antibiotics. 10-10-2021 upon evaluation today patient appears to be doing well with regard to her wound. She has been tolerating the dressing changes that appears to be doing much better. After I saw her last week she did go to the ER they admitted her to the hospital and gave her IV antibiotics she fortunately is doing significantly better. Overall I am extremely pleased with where things stand today. 10-17-2021 upon evaluation today patient appears to be doing well with regard to her wound. In fact this is looking better and her leg is looking better but she still does have some areas here of erythema. We will continue to address this as soon as possible in my opinion. I do believe she may require some debridement and what appears to be a more defined  wound on her left leg at this point. 10-24-2021 upon evaluation patient is definitely showing signs of improvement which is great news. I do not see any evidence of active infection locally or systemically which is great news as well. No fevers, chills, nausea, vomiting, or diarrhea. 10-31-2021. Upon evaluation today patient's leg actually feels better from an infection and wound care perspective. I am actually very pleased in this regard. Unfortunately the biggest issue that I see at this time is that the patient is having a significant issue with her breathing. I did put her on the pulse ox machine to check her oxygen saturation and it was pretty much at 100% which is good but she tells me that when she is walking this is much more difficult for her. She also tells me that when she is trying to bend over to perform her dressing changes she is so short of breath due to the swelling and fluid in her abdominal area that she is just not been able to do this. She is tearful and very concerned today to be honest. I am truly sorry to see her this way I know she is  really having a hard time at the moment. 11-14-2021 upon evaluation patient actually appears to be doing significantly better compared to when I last saw her. She was actually admitted to the hospital I believe this for 6 days total after I sent her on 10-31-2021. Subsequently they actually pulled off greater than 50 pounds of fluid she tells me she feels significantly better her legs are not as tight she actually has some play in the tissue and it is obvious that she is doing much better just from a mobility standpoint as well as a breathing standpoint. Overall I am extremely happy for her and how she is doing the leg also looks much better on the left. 11-21-2021 upon evaluation today patient appears to be doing well although it does appear that she is going require some sharp debridement the wound is very dry this is the opposite of what its been she was having so much weeping that it was staying extremely wet. Nonetheless we do need to see about going ahead and debriding the wound today. 11-28-2021 upon evaluation today patient appears to be doing well with regard to her wound I definitely see signs of things continuing to improve which is great news. I do not see any evidence of active infection locally or systemically also great news. 12-05-2021 upon evaluation today patient appears to be doing well with regard to her wound. She has been tolerating the dressing changes. Fortunately there does not appear to be any signs of active infection locally or systemically at this time. No fevers, chills, nausea, vomiting, or diarrhea. 12-12-2021 upon evaluation patient appears to be doing well with regard to her wound this is actually measuring smaller and looking much better. Fortunately I do not see any signs of active infection locally or systemically at this time which is excellent news. 6/13; small wound area in the folds of her lymphedema just above the left ankle. Fortunately the area looks quite good. She  has been using silver alginate ABDs and an Ace wrap which she is able to change her self. She is not using her compression pumps out of fear that this could contribute to heart failure. 12-26-2021 upon evaluation patient's wounds are actually showing signs of excellent improvement. I am very pleased with where things stand. Overall I think that she has been making  excellent progress and I think we are very close to complete resolution which is great news. 01-11-2022 upon evaluation today patient appears to be doing well currently in regard to her legs in general although on the left leg she does have 1 area that still irritated and inflamed on the anterior portion of her leg. Fortunately I do not see any signs of systemic infection locally there might be some infection going on here however which is my biggest concern. Fortunately I do not see any evidence though of this spreading to any other location. 01-16-2022 upon evaluation today patient has actually showing signs of excellent improvement. Fortunately I do not see any evidence of infection locally or systemically which is great news and overall I am very pleased with where we stand at this point. 01-25-2022 upon evaluation today patient actually appears to be doing decently well in regard to her legs. She has been tolerating the dressing changes without [name], [name] ([id number]) [id number] Page 12 of 14 complication. Fortunately there does not appear to be any evidence of active infection locally or systemically which is great news and overall I am extremely pleased with where we stand at this point. 02-13-2022 upon evaluation today patient appears to be doing well currently in regard to her wound which is actually showing signs of significant improvement. Fortunately I do not see any evidence of active infection locally or systemically at this time. No fevers, chills, nausea, vomiting, or diarrhea. 02-20-2022 upon  evaluation today patient appears to be doing well currently in regard to her wound. She has been tolerating the dressing changes without complication. Fortunately there does not appear to be any signs of active infection locally or systemically at this time. No fevers, chills, nausea, vomiting, or diarrhea. 02-27-2022 upon evaluation today patient appears to be doing excellent in regard to her wounds on the leg. She has been tolerating the dressing changes without complication this is very close to complete resolution. Fortunately I do not see any signs of infection locally or systemically at this time which is great news. No fevers, chills, nausea, vomiting, or diarrhea. 03-06-2022 upon evaluation today patient appears to be doing well currently in regard to her wound in general. She has been tolerating the dressing changes without complication. Fortunately I do not see any evidence of active infection locally or systemically at this time which is great news. I am concerned a little bit however about not really her wounds but the second toe right foot where there appears to be some cellulitis after she dropped a frying pan on this about 2 weeks ago. She tells me its been itching and burning and I do think that it is possible she may have an infection based on what we are seeing currently. 03-20-2022 upon evaluation today patient actually appears to be doing much better at this point. Fortunately there does not appear to be any signs of active infection locally or systemically at this time which is great news. No fevers, chills, nausea, vomiting, or diarrhea. 03-27-2022 upon evaluation today patient appears to be doing somewhat poorly in regard to her legs compared to previous. Her right leg has a couple areas that are open and she is very warm and painful to touch around the lateral portion of the ankle area. Left leg is showing signs of little bit more drainage and weeping as well I think that this is  probably due to the fact that she is swelling a lot more that she has been  in the past. Fortunately I do not see any signs of active infection systemically though locally there is definitely some issues here. 04-03-2022 upon evaluation today patient appears to be doing poorly in regard to her wounds on the legs unfortunately. She also seems to have more significant cellulitis that has ensued. Unfortunately she has been on doxycycline initially it seemed to be helping but as of this week that is no longer the case. It started getting worse last week and with the reinitiation of the doxycycline nothing has improved. Often when she gets like this IV antibiotics are necessary. 04-19-2022 upon evaluation today patient appears to be doing well currently in regard to her legs from an infection standpoint. She did get IV vancomycin in the hospital and states that her legs did get better when she left she was no longer weeping but she had been in the bed for 4 days straight. With that being said since coming home her legs have begun to weep again the original wound that we were dealing with has healed she does have some weeping over both lower extremities however which is still of concern. 05-03-2022 upon evaluation today patient appears to be doing well currently in regard to her left leg which I feel like is a little bit better unfortunately the right leg is still draining significantly. Fortunately I do not see any signs of infection locally or systemically at this time. No fevers, chills, nausea, vomiting, or diarrhea. 05-10-2022 unfortunately patient actually showed signs of increasing erythema today. I believe that the cellulitis never completely resolved from when she was in the hospital last. Again she has been on doxycycline previously by myself I gave her Keflex more recently as an ongoing prescription to try to prevent infection but again I do not think this is strong enough to be able to get this  under control. I really feel like that she is probably can need IV antibiotics and a good period of time in the hospital to be able to keep her legs elevated and not having to get around to do a whole lot in order to allow this to resolve. The patient voiced understanding. Objective Constitutional Well-nourished and well-hydrated in no acute distress. Vitals Time Taken: 11:23 AM, Height: 63 in, Weight: 372 lbs, BMI: 65.9, Temperature: 98.1 F, Pulse: 92 bpm, Respiratory Rate: 18 breaths/min, Blood Pressure: 118/73 mmHg. Respiratory normal breathing without difficulty. Psychiatric this patient is able to make decisions and demonstrates good insight into disease process. Alert and Oriented x 3. pleasant and cooperative. General Notes: Upon inspection patient's is actually appear to be more cellulitic appearing with erythematous leg skin noted which has previously not been an issue to this degree it seems to be worsened last week and very concerned that she probably needs to go back to the hospital to try to get this under control as I do not think the oral antibiotics are doing the job to be honest. Other Condition(s) Patient presents with Lymphedema located on the Right Leg. General Notes: Patient continues to have clear weaping and drainage from Rt anterior and posterior lower leg Patient presents with Lymphedema located on the Left Leg. 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 Nurse Visit as needed Bathing/  Shower/ Hygiene: Clean wound with Normal Saline or wound cleanser. Wash wounds with antibacterial soap and water. May shower; gently cleanse wound  with antibacterial soap, rinse and pat dry prior to dressing wounds No tub bath. Edema Control - Lymphedema / Segmental Compressive Device / Other: Ace wraps Elevate, Exercise Daily and Avoid Standing for Long Periods of Time. Elevate leg(s) parallel to the floor when sitting. DO YOUR BEST to sleep in the bed at night. DO NOT sleep in your recliner. Long hours of sitting in a recliner leads to swelling of the legs and/or potential wounds on your backside. Other: - use lymphedema pumps Non-Wound Condition: Cleanse affected area with antibacterial soap and water, Apply appropriate compression. - use abdominal pads, and chuck pads, them ace wrap to secure Additional non-wound orders/instructions: - silver cell to open weeping areas Medications-Please add to medication list.: P.O. Antibiotics - continue cephalexin as directed General Notes: instructed patient to go to ED for treatment of cellulitis bilat lower legs 1. Based on what I am seeing I do believe the patient needs to go to the ER for further evaluation and treatment I believe she is going require hospital admission to get the infection under control she definitely appears to be more swollen and cellulitic compared to last week even and I think this is headed in the wrong direction. 2. I am also can recommend for now we just put some ABD pads over top of the areas that are weeping and an Ace wrap to secure in place. 3. I would also suggest that following the ED evaluation when she gets out of the hospital she come back for regular visit here with [place] but I think that she really is going require a longer period of IV antibiotics to get this under control compared to what we are seeing currently. We will see patient back for reevaluation in 1 week here in the clinic. If anything worsens or changes patient will contact our office for additional recommendations. Electronic Signature(s) Signed: 05/10/2022 11:57:01 AM By: Worthy Keeler  PA-C Entered By: Worthy Keeler on 05/10/2022 11:57:00 -------------------------------------------------------------------------------- SuperBill Details Patient Name: Date of Service: KIWANNA, SPRAKER 05/10/2022 Medical Record Number: [id number] Patient Account Number: [id number] Date of Birth/Sex: Treating RN: August 12, 1955 (66 y.o. [name] Primary Care Provider: Ranae Plumber Other Clinician: Massie Kluver Referring Provider: Treating Provider/Extender: Skeet Simmer in Treatment: 40 Diagnosis Coding ICD-10 Codes Code Description Q82.0 Hereditary lymphedema L97.811 Non-pressure chronic ulcer of other part of right lower leg limited to breakdown of skin LARA, PALINKAS ([id number]) [id number] Page 14 of 14 L97.821 Non-pressure chronic ulcer of other part of left lower leg limited to breakdown of skin Facility Procedures : CPT4 Code: [id number] Description: 99213 - WOUND CARE VISIT-LEV 3 EST PT Modifier: Quantity: 1 Physician Procedures : CPT4 Code Description Modifier [id number] - WC PHYS LEVEL 5 - EST PT ICD-10 Diagnosis Description Q82.0 Hereditary lymphedema L97.811 Non-pressure chronic ulcer of other part of right lower leg limited to breakdown of skin L97.821 Non-pressure  chronic ulcer of other part of left lower leg limited to breakdown of skin Quantity: 1 Electronic Signature(s) Signed: 05/10/2022 11:57:10 AM By: Worthy Keeler PA-C Entered By: Worthy Keeler on 05/10/2022 11:57:10

## 2022-05-10 NOTE — ED Triage Notes (Addendum)
Pt comes with c/o bilateral leg cellulitis. Pt was sent over by wound clinic for further evaluation. They don't think it is healing like it should.   Pt states no fevers but drainage and odor present. Pt states worse on right. Pt was on meds for week and finished course and a week after as well.

## 2022-05-10 NOTE — Significant Event (Addendum)
Rapid Response was called at 6:58 p for unresponsive and lips turning blue.  I presented to bedside  Patient was up and talking and anxious. She states that she is not feeling well. She states that she feels sick. She vomited in the room.  Vitals obtained showed BP 171/132, RR 16, HR 77, spO2 is 96%, and temperature is 97.7  PERRL, heart rate is regular, no murmurs. Breathing on her own.   She endorses generalized abdominal pain. And she states she was trying to have a bowel movement. She endorses back pain and this is new.  Change in mental status - Etiology work up in progress - Labs ordered: stat troponin, lactic acid, d-dimer - POC glucose was 99 - Stat EKG - CT head w/o contrast stat - CT CAP with contrast ordered stat - Ativan 0.5 mg IV q6h prn for anxiety, 2 doses ordered; phenergan 12.5 mg IM q6h prn for refractory nausea/vomiting, 2 doses ordered - Discussed with cross coverage provider  Dr. Tobie Poet  CRITICAL CARE Performed by: Dr. Tobie Poet  Total critical care time: 35 minutes  Critical care time was exclusive of separately billable procedures and treating other patients.  Critical care was necessary to treat or prevent imminent or life-threatening deterioration.  Critical care was time spent personally by me on the following activities: development of treatment plan with patient and/or surrogate as well as nursing, discussions with consultants, evaluation of patient's response to treatment, examination of patient, obtaining history from patient or surrogate, ordering and performing treatments and interventions, ordering and review of laboratory studies, ordering and review of radiographic studies, pulse oximetry and re-evaluation of patient's condition.

## 2022-05-10 NOTE — Progress Notes (Signed)
RN entered room to talk to patient. Patient with blank stare, not responding, even to painful stimuli. Patient's lips purple. Irregular breathing, weaker pulse. Charge RN called and Rapid response initiated.

## 2022-05-10 NOTE — Assessment & Plan Note (Signed)
-   Status post ceftriaxone 2 g IV  and vancomycin one-time dose in the emergency department - We will continue with ceftriaxone 2 g IV daily for gram-negative coverage and add vancomycin for MRSA coverage -Low clinical suspicion for sepsis at this time given the patient's vitals are within normal limits and no leukocytosis on CBC - Check lactic acid and sed rate

## 2022-05-10 NOTE — H&P (Addendum)
History and Physical   [name] QIW:[id number] DOB: 01-Apr-1956 DOA: 05/10/2022  PCP: Ranae Plumber, PA  Patient coming from: wound clinic  I have personally briefly reviewed patient's old medical records in [place].  Chief Concern: leg wounds  HPI: [name] is a 66 year old female with history of hereditary lymphedema, CKD 3B, meets criteria for OSA, neuropathy, who presents to the emergency department via POV from her wound center for chief concerns of nonhealing/reoccurring cellulitis.  Initial vitals in the emergency department showed temperature of 98, respiration rate of 18, heart rate of 81, blood pressure 103/61, SPO2 of 100% on room air.  Serum sodium is 139, potassium 3.7, chloride 99, bicarb 32, BUN of 43, serum creatinine of 1.66, GFR 34, nonfasting blood glucose 93, WBC 4.8, hemoglobin 10, platelets of 162.  ED treatment: Ceftriaxone 2 g IV one-time dose, vancomycin per pharmacy  At bedside, she is able to tell me her name, current location, current year, and her age. She does not appear to be in acute distress.  She reports she did not feel that abx was not enough. She endorses compliance with her abx, Keflex. She states she went to her PCP for another week. She denies chest pain, shortness of breath, fever, chills, nausea, vomiting. She denies diarrhea, last bowel movement was yesterday.  Social history: She is a former tobacco user. She denies etoh and recreational drug use. 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   ROS: Constitutional: no weight change, no fever ENT/Mouth: no sore throat, no rhinorrhea Eyes: no eye pain, no vision changes Cardiovascular: no chest pain, no dyspnea,  no edema, no palpitations Respiratory: no cough, no sputum, no wheezing Gastrointestinal: no nausea, no vomiting, no diarrhea, no constipation Genitourinary: no urinary incontinence, no dysuria, no hematuria Musculoskeletal: no  arthralgias, no myalgias Skin: + skin lesions, no pruritus, Neuro: + weakness, no loss of consciousness, no syncope Psych: no anxiety, no depression, no decrease appetite Heme/Lymph: no bruising, no bleeding  ED Course: Discussed with emergency medicine provider, patient requiring hospitalization for chief concerns of recurrent/nonhealing cellulitis, failing outpatient therapy  Assessment/Plan  Principal Problem:   Recurrent cellulitis of lower extremity Active Problems:   OSA on CPAP   AKI (acute kidney injury) ([place])   Neuropathy   Morbid obesity with BMI of 60.0-69.9, adult (HCC)   Cellulitis   Assessment and Plan:  * Recurrent cellulitis of lower extremity - Patient has increased risk of recurring cellulitis of the lower extremity - On review of her med reconciliation, patient appears to be on cephalexin 1000 mg daily on what I presume is suppressive therapy - It is noted the patient tested positive for MRSA on 04/05/2022 and given this if patient were to be on suppressive therapy I recommend the patient be discharged and continued for outpatient and antibiotic that has MRSA coverage  OSA on CPAP - Cpap qhs ordered  Cellulitis - Status post ceftriaxone 2 g IV  and vancomycin one-time dose in the emergency department - We will continue with ceftriaxone 2 g IV daily for gram-negative coverage and add vancomycin for MRSA coverage -Low clinical suspicion for sepsis at this time given the patient's vitals are within normal limits and no leukocytosis on CBC - Check lactic acid and sed rate  AKI (acute kidney injury) ([place]) - Baseline serum creatinine is 1.42/eGFR 41  - Lactated Ringer's 125 mL/h, 1 day ordered - BMP in the a.m.  Chart reviewed.  DVT prophylaxis: heparin 5000 units Code Status: full code Diet: npo Family Communication: no Disposition Plan: pending clinical course Consults called: none at this time Admission status: med-surg, inpt  Past Medical History:   Diagnosis Date   Arthritis    Asthma    CHF (congestive heart failure) ([place])    Enlarged heart    Hypertension    Lymphedema    Sleep apnea    Spinal stenosis    Past Surgical History:  Procedure Laterality Date   COLONOSCOPY WITH PROPOFOL N/A 03/12/2018   Procedure: COLONOSCOPY WITH PROPOFOL;  Surgeon: Manya Silvas, MD;  Location[facility];  Service: Endoscopy;  Laterality: N/A;   NO PAST SURGERIES     Social History:  reports that she quit smoking about 31 years ago. Her smoking use included cigarettes. She started smoking about 45 years ago. She smoked an average of .25 packs per day. Her smokeless tobacco use includes snuff. She reports that she does not drink alcohol and does not use drugs.  Allergies  Allergen Reactions   Ace Inhibitors Hives and Swelling   Ibuprofen Hives and Swelling   Vancomycin Itching and Nausea And Vomiting   Family History  Problem Relation Age of Onset   Breast cancer Maternal Aunt    Thyroid disease Other    Family history: Family history reviewed and not pertinent  Prior to Admission medications   Medication Sig Start Date End Date Taking? Authorizing Provider  bumetanide (BUMEX) 2 MG tablet Take 1 tablet (2 mg total) by mouth daily. 03/06/22  Yes Hackney, Tina A, FNP  cephALEXin (KEFLEX) 500 MG capsule Take 1,000 mg by mouth daily. 04/23/22  Yes [provider]  cyanocobalamin 1000 MCG tablet Take 1,000 mcg by mouth every other day.   Yes [provider]  metolazone (ZAROXOLYN) 5 MG tablet Take 0.5 tablets (2.5 mg total) by mouth 2 (two) times a week. Patient taking differently: Take 2.5 mg by mouth 2 (two) times a week. Tues and sat 05/03/22  Yes Darylene Price A, FNP  potassium chloride SA (KLOR-CON M) 20 MEQ tablet Take 2 tablets (40 mEq total) by mouth daily. 11/28/21  Yes Darylene Price A, FNP  acetaminophen (TYLENOL) 500 MG tablet Take 500-1,000 mg by mouth every 6 (six) hours as needed for mild pain or fever.     [provider]  albuterol (VENTOLIN HFA) 108 (90 Base) MCG/ACT inhaler Inhale into the lungs.    [provider]  cetirizine (ZYRTEC) 10 MG tablet Take 1 tablet (10 mg total) by mouth daily. 12/25/20   Lavina Hamman, MD  NYSTATIN powder Apply topically. 03/19/22   [provider]  pregabalin (LYRICA) 100 MG capsule Take 100 mg by mouth daily. Patient not taking: Reported on 05/10/2022    [provider]  senna-docusate (SENOKOT-S) 8.6-50 MG tablet Take 1 tablet by mouth 2 (two) times daily as needed for mild constipation or moderate constipation.    [provider]   Physical Exam: Vitals:   05/10/22 1500 05/10/22 1641 05/10/22 1719 05/10/22 1907  BP: 98/77 (!) 94/34 99/63 (!) 188/92  Pulse: 77 92 81 (!) 127  Resp: 17 18    Temp:  97.9 F (36.6 C)  97.7 F (36.5 C)  TempSrc:  Oral    SpO2: 100% 98%  94%  Weight: (!) 176.9 kg (!) 173.5 kg    Height:  5' 3" (1.6 m)     Constitutional: appears age appropriate, chronically ill, NAD, calm, comfortable  Eyes: PERRL, lids and conjunctivae normal ENMT: Mucous membranes are moist. Posterior pharynx clear of any exudate or lesions. Age-appropriate dentition. Hearing appropriate Neck: normal, supple, no masses, no thyromegaly Respiratory: clear to auscultation bilaterally, no wheezing, no crackles. Normal respiratory effort. No accessory muscle use.  Cardiovascular: Regular rate and rhythm, no murmurs / rubs / gallops. BL lower extremity edema. 2+ pedal pulses. No carotid bruits.  Abdomen: no tenderness, no masses palpated, no hepatosplenomegaly. Bowel sounds positive.  Musculoskeletal: no clubbing / cyanosis. No joint deformity upper and lower extremities. Good ROM, no contractures, no atrophy. Normal muscle tone.  Skin: no rashes, lesions, ulcers. No induration. Skin changes consistent with lymphadenopathy Neurologic: Sensation intact. Strength 5/5 in all 4.  Psychiatric: Normal judgment and insight.  Alert and oriented x 3. Normal mood.   EKG: Not indicated at this time Chest x-ray on Admission: Not indicated at this time  Labs on Admission: I have personally reviewed following labs  CBC: Recent Labs  Lab 05/10/22 1340  WBC 4.8  NEUTROABS 2.6  HGB 10.0*  HCT 31.8*  MCV 82.4  PLT 034   Basic Metabolic Panel: Recent Labs  Lab 05/10/22 1340  NA 139  K 3.7  CL 99  CO2 32  GLUCOSE 93  BUN 43*  CREATININE 1.66*  CALCIUM 8.8*   GFR: Estimated Creatinine Clearance: 53 mL/min (A) (by C-G formula based on SCr of 1.66 mg/dL (H)).  Liver Function Tests: Recent Labs  Lab 05/10/22 1340  AST 16  ALT 9  ALKPHOS 93  BILITOT 0.7  PROT 8.2*  ALBUMIN 3.2*   Urine analysis:    Component Value Date/Time   COLORURINE STRAW (A) 04/03/2022 2321   APPEARANCEUR CLEAR (A) 04/03/2022 2321   APPEARANCEUR Clear 05/18/2013 1725   LABSPEC 1.006 04/03/2022 2321   LABSPEC 1.011 05/18/2013 1725   PHURINE 6.0 04/03/2022 2321   GLUCOSEU NEGATIVE 04/03/2022 2321   GLUCOSEU Negative 05/18/2013 1725   HGBUR NEGATIVE 04/03/2022 2321   BILIRUBINUR NEGATIVE 04/03/2022 2321   BILIRUBINUR Negative 05/18/2013 1725   KETONESUR NEGATIVE 04/03/2022 2321   PROTEINUR NEGATIVE 04/03/2022 2321   NITRITE NEGATIVE 04/03/2022 2321   LEUKOCYTESUR NEGATIVE 04/03/2022 2321   LEUKOCYTESUR Negative 05/18/2013 1725   Dr. Tobie Poet [facility]  If 7PM-7AM, please contact overnight-coverage provider If 7AM-7PM, please contact day coverage provider [email or web address]  05/10/2022, 8:01 PM

## 2022-05-10 NOTE — ED Triage Notes (Signed)
First Nurse Note;  Pt via [place] from [place]. Pt sent over for IV antibiotics for LE cellulitis. States she was recently discharge but not getting any better. Pt is A&Ox4 and NAD. Ambulatory

## 2022-05-10 NOTE — Progress Notes (Signed)
       CROSS COVER NOTE  NAME: [name] MRN: [id number] DOB : 1955/10/17       HPI/Events of Note   Post rapid response  lactic acid reported 5.5.   Assessment and  Interventions   Assessment: Patient currently in CT Yellow MEWS. Still with elevated heart rate and respirations prior to leaving for CT. Afebrile   Plan: Continue Vanc, change rocephin to cefepime Blood culture x2 Trend lactic      Kathlene Cote NP [facility]

## 2022-05-11 ENCOUNTER — Inpatient Hospital Stay: Payer: Medicare HMO

## 2022-05-11 ENCOUNTER — Other Ambulatory Visit (HOSPITAL_COMMUNITY): Payer: Self-pay

## 2022-05-11 ENCOUNTER — Telehealth (HOSPITAL_COMMUNITY): Payer: Self-pay | Admitting: Pharmacy Technician

## 2022-05-11 ENCOUNTER — Encounter
Admission: EM | Disposition: A | Discharge status: Home Health | Payer: Self-pay | Source: Home / Self Care | Attending: Internal Medicine

## 2022-05-11 DIAGNOSIS — N189 Chronic kidney disease, unspecified: Secondary | ICD-10-CM

## 2022-05-11 DIAGNOSIS — L03115 Cellulitis of right lower limb: Secondary | ICD-10-CM | POA: Diagnosis not present

## 2022-05-11 DIAGNOSIS — N179 Acute kidney failure, unspecified: Secondary | ICD-10-CM

## 2022-05-11 DIAGNOSIS — A419 Sepsis, unspecified organism: Secondary | ICD-10-CM

## 2022-05-11 DIAGNOSIS — L03116 Cellulitis of left lower limb: Secondary | ICD-10-CM

## 2022-05-11 DIAGNOSIS — L03119 Cellulitis of unspecified part of limb: Secondary | ICD-10-CM | POA: Diagnosis not present

## 2022-05-11 DIAGNOSIS — R6521 Severe sepsis with septic shock: Secondary | ICD-10-CM

## 2022-05-11 DIAGNOSIS — I129 Hypertensive chronic kidney disease with stage 1 through stage 4 chronic kidney disease, or unspecified chronic kidney disease: Secondary | ICD-10-CM

## 2022-05-11 DIAGNOSIS — Z6841 Body Mass Index (BMI) 40.0 and over, adult: Secondary | ICD-10-CM

## 2022-05-11 DIAGNOSIS — Z87891 Personal history of nicotine dependence: Secondary | ICD-10-CM

## 2022-05-11 HISTORY — PX: CENTRAL LINE INSERTION: CATH118232

## 2022-05-11 LAB — COMPREHENSIVE METABOLIC PANEL
ALT: 11 U/L (ref 0–44)
AST: 23 U/L (ref 15–41)
Albumin: 2.3 g/dL — ABNORMAL LOW (ref 3.5–5.0)
Alkaline Phosphatase: 67 U/L (ref 38–126)
Anion gap: 10 (ref 5–15)
BUN: 44 mg/dL — ABNORMAL HIGH (ref 8–23)
CO2: 26 mmol/L (ref 22–32)
Calcium: 7.5 mg/dL — ABNORMAL LOW (ref 8.9–10.3)
Chloride: 100 mmol/L (ref 98–111)
Creatinine, Ser: 2.01 mg/dL — ABNORMAL HIGH (ref 0.44–1.00)
GFR, Estimated: 27 mL/min — ABNORMAL LOW (ref >60)
Glucose, Bld: 123 mg/dL — ABNORMAL HIGH (ref 70–99)
Potassium: 3.3 mmol/L — ABNORMAL LOW (ref 3.5–5.1)
Sodium: 136 mmol/L (ref 135–145)
Total Bilirubin: 0.9 mg/dL (ref 0.3–1.2)
Total Protein: 6.3 g/dL — ABNORMAL LOW (ref 6.5–8.1)

## 2022-05-11 LAB — CBC
HCT: 31.2 % — ABNORMAL LOW (ref 36.0–46.0)
Hemoglobin: 9.7 g/dL — ABNORMAL LOW (ref 12.0–15.0)
MCH: 25.8 pg — ABNORMAL LOW (ref 26.0–34.0)
MCHC: 31.1 g/dL (ref 30.0–36.0)
MCV: 83 fL (ref 80.0–100.0)
Platelets: 161 K/uL (ref 150–400)
RBC: 3.76 MIL/uL — ABNORMAL LOW (ref 3.87–5.11)
RDW: 14.9 % (ref 11.5–15.5)
WBC: 18.7 K/uL — ABNORMAL HIGH (ref 4.0–10.5)
nRBC: 0 % (ref 0.0–0.2)

## 2022-05-11 LAB — URINALYSIS, ROUTINE W REFLEX MICROSCOPIC
Bilirubin Urine: NEGATIVE
Glucose, UA: NEGATIVE mg/dL
Hgb urine dipstick: NEGATIVE
Ketones, ur: NEGATIVE mg/dL
Leukocytes,Ua: NEGATIVE
Nitrite: NEGATIVE
Protein, ur: NEGATIVE mg/dL
Specific Gravity, Urine: 1.018 (ref 1.005–1.030)
pH: 5 (ref 5.0–8.0)

## 2022-05-11 LAB — MAGNESIUM: Magnesium: 1.6 mg/dL — ABNORMAL LOW (ref 1.7–2.4)

## 2022-05-11 LAB — PROCALCITONIN: Procalcitonin: 30.19 ng/mL

## 2022-05-11 LAB — LACTIC ACID, PLASMA: Lactic Acid, Venous: 1.4 mmol/L (ref 0.5–1.9)

## 2022-05-11 LAB — VANCOMYCIN, RANDOM: Vancomycin Rm: 27 ug/mL

## 2022-05-11 SURGERY — CENTRAL LINE INSERTION
Anesthesia: LOCAL

## 2022-05-11 MED ORDER — SODIUM CHLORIDE 0.9% FLUSH: 10.0000 mL | INTRAVENOUS | Status: DC | PRN

## 2022-05-11 MED ORDER — MIDODRINE HCL 5 MG PO TABS
10.0000 mg | ORAL_TABLET | Freq: Three times a day (TID) | ORAL | Status: DC
  Administered 2022-05-11 – 2022-05-16 (×14): 10 mg via ORAL
  Filled 2022-05-11 (×15): qty 2

## 2022-05-11 MED ORDER — SODIUM CHLORIDE 0.9 % IV SOLN: INTRAVENOUS | Status: DC

## 2022-05-11 MED ORDER — SODIUM CHLORIDE 0.9% FLUSH
10.0000 mL | Freq: Two times a day (BID) | INTRAVENOUS | Status: DC
  Administered 2022-05-11 – 2022-05-16 (×9): 10 mL

## 2022-05-11 MED ORDER — LINEZOLID 600 MG PO TABS
600.0000 mg | ORAL_TABLET | Freq: Two times a day (BID) | ORAL | Status: AC
  Administered 2022-05-11 – 2022-05-15 (×8): 600 mg via ORAL
  Filled 2022-05-11 (×8): qty 1

## 2022-05-11 MED ORDER — CHLORHEXIDINE GLUCONATE CLOTH 2 % EX PADS
6.0000 | MEDICATED_PAD | Freq: Every day | CUTANEOUS | Status: DC
  Administered 2022-05-11 – 2022-05-16 (×6): 6 via TOPICAL

## 2022-05-11 SURGICAL SUPPLY — 4 items
CANNULA 5F STIFF (CANNULA) IMPLANT
KIT CV MULTILUMEN 7FR 20 (SET/KITS/TRAYS/PACK) ×1
KIT CV MULTILUMEN 7FR 20 SUB (SET/KITS/TRAYS/PACK) IMPLANT
SUT SILK 0 FSL (SUTURE) IMPLANT

## 2022-05-11 NOTE — Assessment & Plan Note (Signed)
Meets criteria, last BMI checked at 19.8

## 2022-05-11 NOTE — Consult Note (Signed)
[place] Nurse Consult Note: Reason for Consult: Consult requested for bilat legs.  Pt is familiar to [facility] team from previous admissions, most recently on 4/26. She has chronic lymphadenia and is followed by the outpatient wound care center prior to admission.  Wounds have greatly improved and she has only a few patchy areas of partial thickness skin loss, located in the posterior calf skin folds.  These are red, moist and macerated with mod amt yellow drainage.  It is difficult to promote healing related to the significant depth of the skin folds where there is constant pressure and moisture.  Anterior calves with generalized erythremia and edema, no open wounds or drainage.  Dressing procedure/placement/frequency: Topical treatment orders provided for bedside nurses to perform as follows to absorb drainage and continue plan of care as ordered by the outpatient wound care center: Change dressings to bilat legs Q day as follows:  Tuck Calcium alginate into posterior skin folds Kellie Simmering # [place] # [phone]) and cover and sides of calves and posterior legs with ABD pads, then wrap with kerlex and ace wrap. Pt states she plans to continue to follow-up with the outpatient wound care center after discharge. Please re-consult if further assistance is needed.  Thank-you,  Julien Girt MSN, [place], [place], [place], [place]

## 2022-05-11 NOTE — Progress Notes (Signed)
[facility] Progress Note  Patient: [name]    RXV:[id number]  DOA: 05/10/2022    Date of Service: the patient was seen and examined on 05/11/2022  Brief hospital course: 66 year old female with past medical history of hereditary lymphedema, stage IIIb chronic kidney disease, obstructive sleep apnea and severe morbid obesity who is discharged from the hospital a little over a month ago for sim lower extremity cellulitis and discharged on p.o. antibiotics continue to have issues with persistent erythema, bullous lesions and drainage even after completing an extended antibiotic course, was sent over to the emergency room on 11/2 from her wound care clinic for worsening edema and drainage.  Patient evaluated and found to have cellulitis of the right lower extremity and admitted to the hospitalist service.  Lab work noteworthy for acute kidney injury with a creatinine of 1.66  (baseline around 1.4).  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  Following admission on afternoon of 11/2, patient had an event where she suddenly developed abdominal pain, became tachycardic, spiked blood pressure and then vomited and blood pressure became soft.  Repeat lab work noted jump of lactic acid from 1.7 up to 5.5. Patient started on aggressive fluid resuscitation and Rocephin changed to cefepime.  Follow-up blood work this morning noted white blood cell count of 18.7 (was 4.8 on admission) and procalcitonin level of 30.  During course of day of 11/3, patient's IV access was lost and unable to be replaced.  After discussion with critical care and vascular surgery, patient taken down to Cath Lab and vascular surgery placed central line.  IV antibiotics and IV fluids resumed.  Assessment and Plan: Assessment and Plan: * Recurrent cellulitis of lower extremity As above.  Septic shock ([place]) Difficult to assess, but  appears to be related to cellulitis.  Low patient was complaining of some abdominal pain when the event occurred, none since.  Breathing comfortably on room air.  Labs certainly noted to be significantly changed following presentation in ER with greatly increased white blood cell count, procalcitonin and lactic acid.  As only source of infection to be found has been cellulitis, will presume this is the source.  Septic shock stabilized although infection still ongoing.  Given negative for MRSA colonization, vancomycin discontinued on cefepime only.  Recheck labs in the morning and if procalcitonin or white blood cell count is worse, considering changing to Zosyn.  Patient met criteria for septic shock given lactic acidosis greater than 4, tachycardia, persistent hypotension, tachypnea and leukocytosis.  This occurred within 24 hours of emergency room presentation, but not on admission.  AKI (acute kidney injury) ([place]) in the setting of stage IIIb chronic kidney disease - Baseline serum creatinine is 1.42/eGFR 41.  On admission, creatinine at 1.6 and then this morning up to 2, likely secondary to hypotension brought on by development of shock.  Continue IV fluids now that IV access replaced and recheck labs in the morning.  Chronic diastolic CHF (congestive heart failure) (HCC) Currently dry due to septic shock.  But we will need to be careful given aggressive fluid resuscitation.  Once renal function back to normal and patient is properly fluid resuscitated, will check BNP in a few days.  Hypokalemia Replacing as needed  Lymphedema Contributing factor to patient's cellulitis.  Wound care consulted.  OSA on CPAP - Cpap qhs ordered  Morbid obesity with BMI of 60.0-69.9, adult (HCC) Meets criteria, last BMI checked  at 67.8  Cellulitis - Status post ceftriaxone 2 g IV  and vancomycin one-time dose in the emergency department - We will continue with ceftriaxone 2 g IV daily for gram-negative  coverage and add vancomycin for MRSA coverage -Low clinical suspicion for sepsis at this time given the patient's vitals are within normal limits and no leukocytosis on CBC - Check lactic acid and sed rate       Body mass index is 67.76 kg/m.        Consultants: Vascular surgery Wound care  Procedures: Central line placement vascular surgery 11/3  Antimicrobials: IV vancomycin 11/2 IV cefepime 11/2-present  Code Status: Full code   Subjective: Patient complains of lower extremity leg pain and discomfort.  Denies any shortness of breath.  Objective: Noted softer blood pressures Vitals:   05/11/22 1730 05/11/22 1900  BP: (!) 106/46 106/61  Pulse: 89 89  Resp: 20 18  Temp:  98.2 F (36.8 C)  SpO2: 99% 97%    Intake/Output Summary (Last 24 hours) at 05/11/2022 1935 Last data filed at 05/11/2022 1700 Gross per 24 hour  Intake --  Output 1200 ml  Net -1200 ml   Filed Weights   05/10/22 1500 05/10/22 1641  Weight: (!) 176.9 kg (!) 173.5 kg   Body mass index is 67.76 kg/m.  Exam:  General: Alert and oriented x3, no acute distress HEENT: Normocephalic, atraumatic, mucous membranes slightly dry Cardiovascular: Regular rate and rhythm, S1-S2 Respiratory: Decreased breath sounds throughout secondary to body habitus Abdomen: Soft, obese, nontender, positive bowel sounds Musculoskeletal: No clubbing or cyanosis, chronic venous stasis.  3-4+ lymphedema with several bullous lesions and some weeping fluid Skin: As described above.  Plus chronic venous stasis and some areas of right lower extremity cellulitis Psychiatry: Appropriate, no evidence of psychoses Neurology: No focal deficits  Data Reviewed: Lab this morning noteworthy for procalcitonin of 30, white blood cell count of 18, worsening creatinine.  Lactic acid on afternoon at 1.4  Disposition:  Status is: Inpatient Remains inpatient appropriate because:  -Stabilization of infection -Improvement in renal  function    Anticipated discharge date: 11/6  Family Communication: We will call patient's son DVT Prophylaxis: heparin injection 5,000 Units Start: 05/10/22 2200 Place TED hose Start: 05/10/22 1434    Author: Annita Brod ,MD 05/11/2022 7:35 PM  To reach On-call, see care teams to locate the attending and reach out via [email or web address]. Between 7PM-7AM, please contact night-coverage If you still have difficulty reaching the attending provider, please page the [facility] (Director on Call) for [facility] on amion for assistance.

## 2022-05-11 NOTE — Telephone Encounter (Signed)
Pharmacy Patient Advocate Encounter  Insurance verification completed.    The patient is insured through [employer] Part D   The patient is currently admitted and ran test claims for the following: linezolid (Zyvox).  Copays and coinsurance results were relayed to Inpatient clinical team.

## 2022-05-11 NOTE — Assessment & Plan Note (Signed)
Difficult to assess, but appears to be related to cellulitis.  Low patient was complaining of some abdominal pain when the event occurred, none since.  Breathing comfortably on room air.  Labs certainly noted to be significantly changed following presentation in ER with greatly increased white blood cell count, procalcitonin and lactic acid.  As only source of infection to be found has been cellulitis, will presume this is the source.  Septic shock stabilized although infection still ongoing.  Given negative for MRSA colonization, vancomycin discontinued on cefepime only.  Recheck labs in the morning and if procalcitonin or white blood cell count is worse, considering changing to Zosyn.  Patient met criteria for septic shock given lactic acidosis greater than 4, tachycardia, persistent hypotension, tachypnea and leukocytosis.  This occurred within 24 hours of emergency room presentation, but not on admission.

## 2022-05-11 NOTE — TOC Benefit Eligibility Note (Signed)
Patient Teacher, English as a foreign language completed.    The patient is currently admitted and upon discharge could be taking linezolid (Zyvox) 600 mg tablets.  The current 10 day co-pay is $0.00.   The patient is insured through [place], [place] Patient Advocate Specialist [place] Patient Advocate Team Direct Number: [phone]  Fax: [phone]

## 2022-05-11 NOTE — Assessment & Plan Note (Signed)
Currently dry due to septic shock.  But we will need to be careful given aggressive fluid resuscitation.  Once renal function back to normal and patient is properly fluid resuscitated, will check BNP in a few days.

## 2022-05-11 NOTE — Consult Note (Signed)
MRN : [id number]  [name] is a 66 y.o. (10/21/1955) female who presents with chief complaint of legs hurt and swell have problem with infection.  History of Present Illness:   I am asked to see the patient by Dr. [place] [name] on.  Patient is a 66 year old woman admitted to [facility] approximately 2 days ago with bilateral lower extremity cellulitis associated with poorly healing wounds.  I am asked to assess her for venous access as she is lost 3 peripheral IVs today.  Her [facility] protocol she is not a candidate for a PICC line because of her renal function.  I am asked to evaluate.  At the time I met her she is still complaining of leg pain.  Current Meds  Medication Sig   bumetanide (BUMEX) 2 MG tablet Take 1 tablet (2 mg total) by mouth daily.   cephALEXin (KEFLEX) 500 MG capsule Take 1,000 mg by mouth daily.   cyanocobalamin 1000 MCG tablet Take 1,000 mcg by mouth every other day.   metolazone (ZAROXOLYN) 5 MG tablet Take 0.5 tablets (2.5 mg total) by mouth 2 (two) times a week. (Patient taking differently: Take 2.5 mg by mouth 2 (two) times a week. Tues and sat)   potassium chloride SA (KLOR-CON M) 20 MEQ tablet Take 2 tablets (40 mEq total) by mouth daily.    Past Medical History:  Diagnosis Date   Arthritis    Asthma    CHF (congestive heart failure) ([place])    Enlarged heart    Hypertension    Lymphedema    Sleep apnea    Spinal stenosis     Past Surgical History:  Procedure Laterality Date   COLONOSCOPY WITH PROPOFOL N/A 03/12/2018   Procedure: COLONOSCOPY WITH PROPOFOL;  Surgeon: Manya Silvas, MD;  Location: [facility];  Service: Endoscopy;  Laterality: N/A;   NO PAST SURGERIES      Social History Social History   Tobacco Use   Smoking status: Former    Packs/day: 0.25    Types: Cigarettes    Start date: 07/09/1976    Quit date: 07/09/1990    Years since quitting: 31.8   Smokeless tobacco: Current    Types: Snuff   Vaping Use   Vaping Use: Never used  Substance Use Topics   Alcohol use: No   Drug use: No    Family History Family History  Problem Relation Age of Onset   Breast cancer Maternal Aunt    Thyroid disease Other     Allergies  Allergen Reactions   Ace Inhibitors Hives and Swelling   Ibuprofen Hives and Swelling   Vancomycin Itching and Nausea And Vomiting     REVIEW OF SYSTEMS (Negative unless checked)  Constitutional: _0 Weight loss  _1 Fever  _2 Chills Cardiac: _3 Chest pain   _4 Chest pressure   _5 Palpitations   _6 Shortness of breath when laying flat   _7 Shortness of breath with exertion. Vascular:  _8 Pain in legs with walking   _9 Pain in legs at rest  _10 History of DVT   _11 Phlebitis   _12 Swelling in legs   _13 Varicose veins   _14 Non-healing ulcers Pulmonary:   _15 Uses home oxygen   _16 Productive cough   _17 Hemoptysis   _18 Wheeze  _19 COPD   _20 Asthma Neurologic:  _21 Dizziness   _22 Seizures   _23 History of stroke   _24 History of TIA  _25 Aphasia   _26 Vissual changes   _27 Weakness or numbness in arm   _28 Weakness or numbness  in leg Musculoskeletal:   _0 Joint swelling   _1 Joint pain   _2 Low back pain Hematologic:  _3 Easy bruising  _4 Easy bleeding   _5 Hypercoagulable state   _6 Anemic Gastrointestinal:  _7 Diarrhea   _8 Vomiting  _9 Gastroesophageal reflux/heartburn   _10 Difficulty swallowing. Genitourinary:  _11 Chronic kidney disease   _12 Difficult urination  _13 Frequent urination   _14 Blood in urine Skin:  _15 Rashes   _16 Ulcers  Psychological:  _17 History of anxiety   _18  History of major depression.  Physical Examination  Vitals:   05/11/22 0754 05/11/22 1542 05/11/22 1703 05/11/22 1730  BP: (!) 96/49 (!) 90/49 (!) 92/48 (!) 106/46  Pulse: 100 86 88 89  Resp: _19 Temp: 100 F (37.8 C) 98.1 F (36.7 C) 98.9 F (37.2 C)   TempSrc:   Oral   SpO2: 97% 100% 99% 99%  Weight:      Height:       Body mass index is 67.76 kg/m. Gen: WD/WN, mild distress morbidly obese Head: [place]/AT, No temporalis  wasting.  Ear/Nose/Throat: Hearing grossly intact, nares w/o erythema or drainage, pinna without lesions Eyes: PER, EOMI, sclera nonicteric.  Neck: Supple, no gross masses.  No JVD.  Pulmonary:  Good air movement, no audible wheezing, no use of accessory muscles.  Cardiac: RRR, precordium not hyperdynamic. Vascular:  scattered varicosities present bilaterally.  Severe venous stasis changes to the legs bilaterally.  4+ soft pitting edema  Vessel Right Left  Radial Palpable Palpable  Gastrointestinal: soft, non-distended. No guarding/no peritoneal signs.  Musculoskeletal: M/S 5/5 throughout.  No deformity.  Neurologic: CN 2-12 intact. Pain and light touch intact in extremities.  Symmetrical.  Speech is fluent. Motor exam as listed above. Psychiatric: Judgment intact, Mood & affect appropriate for pt's clinical situation. Dermatologic: Venous rashes multiple ulcers noted.  No changes consistent with cellulitis. Lymph : No lichenification or skin changes of chronic lymphedema.  CBC Lab Results  Component Value Date   WBC 18.7 (H) 05/11/2022   HGB 9.7 (L) 05/11/2022   HCT 31.2 (L) 05/11/2022   MCV 83.0 05/11/2022   PLT 161 05/11/2022    BMET    Component Value Date/Time   NA 136 05/11/2022 0505   NA 136 09/30/2013 0407   K 3.3 (L) 05/11/2022 0505   K 4.1 09/30/2013 0407   CL 100 05/11/2022 0505   CL 102 09/30/2013 0407   CO2 26 05/11/2022 0505   CO2 28 09/30/2013 0407   GLUCOSE 123 (H) 05/11/2022 0505   GLUCOSE 95 09/30/2013 0407   BUN 44 (H) 05/11/2022 0505   BUN 14 09/30/2013 0407   CREATININE 2.01 (H) 05/11/2022 0505   CREATININE 0.95 09/30/2013 0407   CALCIUM 7.5 (L) 05/11/2022 0505   CALCIUM 8.4 (L) 09/30/2013 0407   GFRNONAA 27 (L) 05/11/2022 0505   GFRNONAA >60 09/30/2013 0407   GFRAA >60 10/28/2019 0548   GFRAA >60 09/30/2013 0407   Estimated Creatinine Clearance: 43.8 mL/min (A) (by C-G formula based on SCr of 2.01 mg/dL (H)).  COAG Lab Results  Component  Value Date   INR 1.2 01/20/2021   INR 1.1 01/19/2021   INR 1.3 (H) 12/24/2020    Radiology DG Chest Port 1 View  Result Date: 05/11/2022 CLINICAL DATA:  Central line placement EXAM: PORTABLE CHEST 1 VIEW COMPARISON:  10/31/2021 x-ray, 05/10/2022 CT FINDINGS: Right IJ approach central venous catheter terminating near the superior cavoatrial junction. No pneumothorax. Heart size within normal limits. Increased interstitial markings bilaterally. No pleural effusion. IMPRESSION: 1. Right IJ approach  central venous catheter. No pneumothorax. 2. Increased interstitial markings bilaterally, may reflect mild edema. Electronically Signed   By: Davina Poke D.O.   On: 05/11/2022 18:13   CARDIAC CATHETERIZATION  Result Date: 05/11/2022 See surgical note for result.  CT CHEST ABDOMEN PELVIS W CONTRAST  Result Date: 05/10/2022 CLINICAL DATA:  Sepsis.  Aortic aneurysm. EXAM: CT CHEST, ABDOMEN, AND PELVIS WITH CONTRAST TECHNIQUE: Multidetector CT imaging of the chest, abdomen and pelvis was performed following the standard protocol during bolus administration of intravenous contrast. RADIATION DOSE REDUCTION: This exam was performed according to the departmental dose-optimization program which includes automated exposure control, adjustment of the mA and/or kV according to patient size and/or use of iterative reconstruction technique. CONTRAST:  117m OMNIPAQUE IOHEXOL 300 MG/ML  SOLN COMPARISON:  CT of the chest abdomen pelvis dated 03/25/2019. FINDINGS: CT CHEST FINDINGS Cardiovascular: There is no cardiomegaly or pericardial effusion. There is coronary vascular calcification. The thoracic aorta is unremarkable. The origins of the great vessels of the aortic arch and the central pulmonary arteries appear patent. Mediastinum/Nodes: No hilar or mediastinal adenopathy. The esophagus and thyroid gland are grossly unremarkable. No mediastinal fluid collection. Lungs/Pleura: No focal consolidation, pleural  effusion, or pneumothorax. The central airways are patent. Musculoskeletal: No acute osseous pathology. CT ABDOMEN PELVIS FINDINGS No intra-abdominal free air or free fluid. Hepatobiliary: Mild fatty liver. No biliary dilatation. The gallbladder is unremarkable. Pancreas: Unremarkable. No pancreatic ductal dilatation or surrounding inflammatory changes. Spleen: Normal in size without focal abnormality. Adrenals/Urinary Tract: The adrenal glands unremarkable. Subcentimeter left renal upper pole hypodense focus is too small characterize. There is no hydronephrosis on either side. The visualized ureters and urinary bladder appear unremarkable. Stomach/Bowel: There is no bowel obstruction or active inflammation. The appendix is normal. Vascular/Lymphatic: The abdominal aorta and IVC unremarkable. No portal venous gas. There is no adenopathy. Reproductive: The uterus is anteverted and grossly unremarkable. No adnexal masses. Other: None Musculoskeletal: Lower lumbar facet arthropathy. No acute osseous pathology. IMPRESSION: 1. No acute intrathoracic, abdominal, or pelvic pathology. 2. Mild fatty liver. Electronically Signed   By: AAnner CreteM.D.   On: 05/10/2022 20:37   CT HEAD WO CONTRAST (5MM)  Result Date: 05/10/2022 CLINICAL DATA:  Altered mental status EXAM: CT HEAD WITHOUT CONTRAST TECHNIQUE: Contiguous axial images were obtained from the base of the skull through the vertex without intravenous contrast. RADIATION DOSE REDUCTION: This exam was performed according to the departmental dose-optimization program which includes automated exposure control, adjustment of the mA and/or kV according to patient size and/or use of iterative reconstruction technique. COMPARISON:  None Available. FINDINGS: Brain: No acute intracranial findings are seen. There are no signs of bleeding within the cranium. Ventricles are not dilated. Cortical sulci are prominent. Vascular: Unremarkable. Skull: Unremarkable.  Sinuses/Orbits: Small osteoma is seen in frontal sinus. Other: None. IMPRESSION: No acute intracranial findings are seen in noncontrast CT brain. Electronically Signed   By: PElmer PickerM.D.   On: 05/10/2022 20:35   [place]  Result Date: 04/24/2022 CLINICAL DATA:  Stage III renal disease EXAM: RENAL / URINARY TRACT ULTRASOUND COMPLETE COMPARISON:  None Available. FINDINGS: Right Kidney: Renal measurements: 10.7 x 4.2 x 4.3 cm = volume: 100.3 mL. Echogenicity within normal limits. No mass or hydronephrosis visualized. Left Kidney: Renal measurements: 9.8 x 5.4 x 5.1 cm = volume: 140 mL. Echogenicity within normal limits. No mass or hydronephrosis visualized. Bladder: Appears normal for degree of bladder distention. Other: None. IMPRESSION: Normal study. No cause for chronic renal  failure identified. Electronically Signed   By: Dorise Bullion III M.D.   On: 04/24/2022 12:33     Assessment/Plan 1.  Cellulitis bilateral lower extremities: Patient will require IV access.  Triple-lumen is recommended.  Right IJ approach will be evaluated with ultrasound.  Risk and benefits of been reviewed patient agrees to proceed.  She has had a central line in the past.  Antibiotics will continue after parenteral access has been secured.  2.  Acute on chronic renal insufficiency: The patient has advanced renal disease.  However, at the present time the patient is not yet on dialysis.  Avoid nephrotoxic medications and dehydration.  Further plans per nephrology  3.  Hypertension: Continue antihypertensive medications as already ordered, these medications have been reviewed and there are no changes at this time.    Hortencia Pilar, MD  05/11/2022 6:22 PM

## 2022-05-11 NOTE — Op Note (Signed)
OPERATIVE NOTE   PROCEDURE: Insertion of triple-lumen central venous catheter right IJ approach.  PRE-OPERATIVE DIAGNOSIS: cellulitis lower extremity  POST-OPERATIVE DIAGNOSIS: same  SURGEON: Katha Cabal M.D.  ANESTHESIA: 1% lidocaine local infiltration  ESTIMATED BLOOD LOSS: Minimal cc  INDICATIONS:   [name] is a 66 y.o. female who presents with cellulitis lower extremity with no venous access.  DESCRIPTION: After obtaining full informed written consent, the patient was positioned supine. The right neck was prepped and draped in a sterile fashion. Ultrasound was placed in a sterile sleeve. Ultrasound was utilized to identify the right IJ vein which is noted to be echolucent and compressible indicating patency. Images recorded for the permanent record. Under real-time visualization a Seldinger needle is inserted into the vein and the guidewires advanced without difficulty. Small counterincision was made at the wire insertion site. Dilators passed over the wire and the triple-lumen catheter is fed without difficulty.  All 3 lm aspirate and flush easily and are packed with heparin saline. Catheter secured to the skin of the right neck with 2-0 silk. A sterile dressing is applied with Biopatch.  COMPLICATIONS: none  CONDITION: good  Katha Cabal, M.D. [facility]. Office:  [phone]   05/11/2022, 5:31 PM

## 2022-05-11 NOTE — Progress Notes (Signed)
1830- Patient complaining of itching everywhere. Patient states this is a normal reaction to the vancomycin, which she has had in the past. Paged Dr. Tobie Poet and IV benadryl requested.

## 2022-05-11 NOTE — Assessment & Plan Note (Signed)
See above

## 2022-05-11 NOTE — Progress Notes (Signed)
Patient is alert and oriented x4. A rapid response was called for patient on previous shift at shift change by day nurse. I followed up with the Yellow MEWS that was established on day shift by getting the v/s q2h x2. Lactic acid of 5.5 and tachypnea reported to Sharion Settler, NP. Patient denies additional needs. IV to right arm discontinued due to it being reddened and painful. CT started a hand IV and IV team placed a forearm IV. Patient denied pain and additional needs. Could benefit from a PICC line due to her being a difficult IV stick and lab has a hard time obtaining blood work. Cannot place SCDs due to the swelling to patient's legs bilaterally. Patient had a large bowel movement last night. Patient rolls from side to side well but takes 2 people to assist due to her size. Will continue to monitor patient.

## 2022-05-11 NOTE — Assessment & Plan Note (Signed)
Contributing factor to patient's cellulitis.  Wound care consulted.

## 2022-05-11 NOTE — Assessment & Plan Note (Signed)
Replacing as needed 

## 2022-05-11 NOTE — TOC Initial Note (Signed)
Transition of Care [facility]) - Initial/Assessment Note    Patient Details  Name: [name] MRN: [id number] Date of Birth: September 30, 1955  Transition of Care [facility]) CM/SW Contact:    Magnus Ivan, LCSW Phone Number: 05/11/2022, 9:58 AM  Clinical Narrative:                 Patient has high readmission risk score. CSW completed assessment with patient. Patient lives with her son. Uses medical transportation for appointments. Will need EMS transport home, confirmed address in chart with patient.  PCP is Ranae Plumber. Pharmacy is [facility]. Patient has a cane, RW, and hospital bed at home. Patient [facility] and also is active with [facility]. CSW sent secure email to [place] with [place].  TOC will continue to follow.   Expected Discharge Plan: [place] Barriers to Discharge: Continued Medical Work up   Patient Goals and CMS Choice Patient states their goals for this hospitalization and ongoing recovery are:: home with home health CMS Medicare.gov Compare Post Acute Care list provided to:: Patient Choice offered to / list presented to : Patient  Expected Discharge Plan and Services Expected Discharge Plan: [place]       Living arrangements for the past 2 months: Single Family Home                                      Prior Living Arrangements/Services Living arrangements for the past 2 months: Single Family Home Lives with:: Adult Children Patient language and need for interpreter reviewed:: Yes Do you feel safe going back to the place where you live?: Yes      Need for Family Participation in Patient Care: Yes (Comment) Care giver support system in place?: Yes (comment) Current home services: DME, Home OT, Home PT Criminal Activity/Legal Involvement Pertinent to Current Situation/Hospitalization: No - Comment as needed  Activities of Daily Living Home Assistive  Devices/Equipment: Gilford Rile (specify type) ADL Screening (condition at time of admission) Is the patient deaf or have difficulty hearing?: No Does the patient have difficulty seeing, even when wearing glasses/contacts?: No Does the patient have difficulty concentrating, remembering, or making decisions?: No Does the patient have difficulty dressing or bathing?: No Does the patient have difficulty walking or climbing stairs?: No  Permission Sought/Granted Permission sought to share information with : Facility Sport and exercise psychologist, Family Supports Permission granted to share information with : Yes, Verbal Permission Granted     Permission granted to share info w AGENCY: as needed        Emotional Assessment       Orientation: : Oriented to Self, Oriented to Place, Oriented to  Time, Oriented to Situation Alcohol / Substance Use: Not Applicable Psych Involvement: No (comment)  Admission diagnosis:  Cellulitis [L03.90] Cellulitis of lower extremity, unspecified laterality [G83.662] Patient Active Problem List   Diagnosis Date Noted   Cellulitis 05/10/2022   Recurrent cellulitis of lower extremity 05/10/2022   Hypokalemia 04/05/2022   Hypomagnesemia 04/05/2022   Hypotension 04/05/2022   Morbid obesity with BMI of 60.0-69.9, adult ([place]) 04/03/2022   Bilateral leg pain 04/03/2022   Lymphedema of lower extremity 11/01/2021   Acute on chronic diastolic CHF (congestive heart failure) ([place]) 10/31/2021   Thrombocytopenia ([place]) 10/06/2021   Neuropathy 10/05/2021   Chronic kidney disease, stage 3a ([place]) 10/03/2021   Chronic  diastolic CHF (congestive heart failure) ([place]) 10/03/2021   OSA on CPAP 04/17/2021   CPAP use counseling 04/17/2021   Sepsis due to cellulitis ([place]) 01/20/2021   DNR (do not resuscitate)    Lactic acidosis    AKI (acute kidney injury) ([place])    Bilateral lower leg cellulitis 04/15/2020   Anemia of chronic disease    Cellulitis of left lower extremity 11/03/2018    Sepsis ([place]) 04/17/2018   Chest pain with high risk for cardiac etiology 02/04/2017   Heart palpitations 02/04/2017   SOB (shortness of breath) on exertion 02/04/2017   OSA (obstructive sleep apnea) 08/04/2016   HTN (hypertension) 08/04/2016   Asthma 08/04/2016   Screening for colon cancer 05/17/2015   Cellulitis of right leg 01/23/2015   Skin rash 12/28/2013   Tobacco use 12/28/2013   Spinal stenosis 07/11/2011   Cataracts, bilateral 05/30/2010   History of colonic polyps 12/28/2009   Lymphedema 02/10/2009   Idiopathic urticaria 02/10/2009   Morbid obesity with BMI of 70 and over, adult ([place]) 02/10/2009   Other abnormal glucose 02/10/2009   Essential hypertension 02/10/2009   PCP:  Ranae Plumber, [place] Pharmacy:   [place], [place] - [place] [address] [place] [address] Phone: [phone] Fax: [phone]     Social Determinants of Health (SDOH) Interventions    Readmission Risk Interventions    05/11/2022    9:56 AM 01/20/2021    9:48 AM  Readmission Risk Prevention Plan  Transportation Screening Complete Complete  PCP or Specialist Appt within 5-7 Days  Complete  PCP or Specialist Appt within 3-5 Days Complete   Home Care Screening  Complete  Medication Review (RN CM)  Referral to Pharmacy  HRI or Home Care Consult Complete   Social Work Consult for [place] Planning/Counseling Complete   Palliative Care Screening Not Applicable   Medication Review Press photographer) Complete

## 2022-05-11 NOTE — Progress Notes (Addendum)
   05/10/22 1907  Assess: MEWS Score  Temp 97.7 F (36.5 C)  BP (!) 188/92  MAP (mmHg) 117  Pulse Rate (!) 127  SpO2 94 %  Assess: MEWS Score  MEWS Temp 0  MEWS Systolic 0  MEWS Pulse 2  MEWS RR 0  MEWS LOC 0  MEWS Score 2  MEWS Score Color Yellow  Assess: if the MEWS score is Yellow or Red  Were vital signs taken at a resting state? Yes  Focused Assessment Change from prior assessment (see assessment flowsheet)  Does the patient meet 2 or more of the SIRS criteria? Yes  Does the patient have a confirmed or suspected source of infection? Yes  Provider and Rapid Response Notified? Yes  MEWS guidelines implemented *See Row Information* Yes  Treat  MEWS Interventions Escalated (See documentation below)  Take Vital Signs  Increase Vital Sign Frequency  Yellow: Q 2hr X 2 then Q 4hr X 2, if remains yellow, continue Q 4hrs  Escalate  MEWS: Escalate Yellow: discuss with charge nurse/RN and consider discussing with provider and RRT  Notify: Charge Nurse/RN  Name of Charge Nurse/RN Notified Erika RN  Date Charge Nurse/RN Notified 05/10/22  Time Charge Nurse/RN Notified 1907  Notify: Provider  Provider Name/Title Dr. Tobie Poet  Date Provider Notified 05/11/22  Time Provider Notified 1907  Method of Notification Face-to-face  Notification Reason Change in status  Provider response In department  Date of Provider Response 05/10/22  Time of Provider Response 1907  Notify: Rapid Response  Name of Rapid Response RN Notified Dyke Maes RN  Date Rapid Response Notified 05/10/22  Time Rapid Response Notified 0907  Document  Patient Outcome Stabilized after interventions  Progress note created (see row info) Yes  Assess: SIRS CRITERIA  SIRS Temperature  0  SIRS Pulse 1  SIRS Respirations  0  SIRS WBC 1  SIRS Score Sum  2

## 2022-05-12 DIAGNOSIS — L03119 Cellulitis of unspecified part of limb: Secondary | ICD-10-CM | POA: Diagnosis not present

## 2022-05-12 DIAGNOSIS — N179 Acute kidney failure, unspecified: Secondary | ICD-10-CM | POA: Diagnosis not present

## 2022-05-12 DIAGNOSIS — A419 Sepsis, unspecified organism: Secondary | ICD-10-CM | POA: Diagnosis not present

## 2022-05-12 DIAGNOSIS — R6521 Severe sepsis with septic shock: Secondary | ICD-10-CM | POA: Diagnosis not present

## 2022-05-12 LAB — BASIC METABOLIC PANEL
Anion gap: 8 (ref 5–15)
BUN: 42 mg/dL — ABNORMAL HIGH (ref 8–23)
CO2: 28 mmol/L (ref 22–32)
Calcium: 7.8 mg/dL — ABNORMAL LOW (ref 8.9–10.3)
Chloride: 101 mmol/L (ref 98–111)
Creatinine, Ser: 1.68 mg/dL — ABNORMAL HIGH (ref 0.44–1.00)
GFR, Estimated: 33 mL/min — ABNORMAL LOW (ref >60)
Glucose, Bld: 97 mg/dL (ref 70–99)
Potassium: 2.9 mmol/L — ABNORMAL LOW (ref 3.5–5.1)
Sodium: 137 mmol/L (ref 135–145)

## 2022-05-12 LAB — CBC
HCT: 27.5 % — ABNORMAL LOW (ref 36.0–46.0)
Hemoglobin: 8.5 g/dL — ABNORMAL LOW (ref 12.0–15.0)
MCH: 25.8 pg — ABNORMAL LOW (ref 26.0–34.0)
MCHC: 30.9 g/dL (ref 30.0–36.0)
MCV: 83.3 fL (ref 80.0–100.0)
Platelets: 135 10*3/uL — ABNORMAL LOW (ref 150–400)
RBC: 3.3 MIL/uL — ABNORMAL LOW (ref 3.87–5.11)
RDW: 15 % (ref 11.5–15.5)
WBC: 12.8 10*3/uL — ABNORMAL HIGH (ref 4.0–10.5)
nRBC: 0 % (ref 0.0–0.2)

## 2022-05-12 LAB — PROCALCITONIN: Procalcitonin: 22.46 ng/mL

## 2022-05-12 LAB — LACTIC ACID, PLASMA: Lactic Acid, Venous: 0.9 mmol/L (ref 0.5–1.9)

## 2022-05-12 LAB — MAGNESIUM: Magnesium: 1.7 mg/dL (ref 1.7–2.4)

## 2022-05-12 MED ORDER — POTASSIUM CHLORIDE 10 MEQ/100ML IV SOLN
10.0000 meq | INTRAVENOUS | Status: AC
  Administered 2022-05-12 (×4): 10 meq via INTRAVENOUS
  Filled 2022-05-12: qty 100

## 2022-05-12 MED ORDER — MAGNESIUM SULFATE 2 GM/50ML IV SOLN
2.0000 g | Freq: Once | INTRAVENOUS | Status: AC
  Administered 2022-05-12: 2 g via INTRAVENOUS
  Filled 2022-05-12: qty 50

## 2022-05-12 MED ORDER — DOXYCYCLINE HYCLATE 100 MG PO TABS: 100.0000 mg | ORAL_TABLET | Freq: Two times a day (BID) | ORAL | Status: DC

## 2022-05-12 NOTE — Progress Notes (Signed)
Progress Note   Patient: [name] ZDG:[id number] DOB: 05-19-1956 DOA: 05/10/2022     2 DOS: the patient was seen and examined on 05/12/2022   Brief hospital course: 66 year old female with past medical history of hereditary lymphedema, stage IIIb chronic kidney disease, obstructive sleep apnea and severe morbid obesity who is discharged from the hospital a little over a month ago for sim lower extremity cellulitis and discharged on p.o. antibiotics continue to have issues with persistent erythema, bullous lesions and drainage even after completing an extended antibiotic course, was sent over to the emergency room on 11/2 from her wound care clinic for worsening edema and drainage.  Patient evaluated and found to have cellulitis of the right lower extremity and admitted to the hospitalist service.  Lab work noteworthy for acute kidney injury with a creatinine of 1.66  (baseline around 1.4).  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  Following admission on afternoon of 11/2, patient had an event where she suddenly developed abdominal pain, became tachycardic, spiked blood pressure and then vomited and blood pressure became soft.  Repeat lab work noted jump of lactic acid from 1.7 up to 5.5. Patient started on aggressive fluid resuscitation and Rocephin changed to cefepime.  Follow-up blood work this morning noted white blood cell count of 18.7 (was 4.8 on admission) and procalcitonin level of 30.  During course of day of 11/3, patient's IV access was lost and unable to be replaced.  After discussion with critical care and vascular surgery, patient taken down to Cath Lab and vascular surgery placed central line.  IV antibiotics and IV fluids resumed.  Assessment and Plan:  * Recurrent cellulitis of lower extremity Septic shock [facility]) Patient met criteria for septic shock given lactic acidosis greater than  4, tachycardia, persistent hypotension, tachypnea and leukocytosis.  This occurred within 24 hours of emergency room presentation, but not on admission. Patient on my examination, patient had significant right lower extremity cellulitis, septic shock appear to be due to cellulitis Patient initially received vancomycin, now changed to cefepime only. Patient states that she had reaction to vancomycin last time in the hospital when he developed significant abdominal pain and low blood pressure.  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 Patient condition appears to be improving today, Vancomycin discontinued since admission. Continue cefepime and Zyvox.  Acute kidney injury on stage IIIb chronic kidney disease Hypokalemia. Hypomagnesemia. Renal function has improved, discontinue fluids.  Continue replete potassium, also give additional 2 g of magnesium sulfate for magnesium 1.7.  Check levels tomorrow.  Chronic diastolic CHF (congestive heart failure) ([place]) Patient currently does not have volume overload.  Hypokalemia Replacing as needed  Lymphedema Chronic.  OSA on CPAP - Cpap qhs ordered  Morbid obesity with BMI of 60.0-69.9, adult (HCC) Meets criteria, last BMI checked at 67.8      Subjective:  Patient feels much better today, leg swelling is better. No fever or chills.  Physical Exam: Vitals:   05/11/22 1730 05/11/22 1900 05/12/22 0447 05/12/22 0807  BP: (!) 106/46 106/61 (!) 90/51 (!) 90/52  Pulse: 89 89 90 81  Resp: _0 Temp:  98.2 F (36.8 C) 99.8 F (37.7 C) 98.6 F (37 C)  TempSrc:   Oral Oral  SpO2: 99% 97% 92% 93%  Weight:      Height:       General exam: Appears calm and comfortable, morbidly  obese. Respiratory system: Clear to auscultation. Respiratory effort normal. Cardiovascular system: S1 & S2 heard, RRR. No JVD, murmurs, rubs, gallops or clicks.  Gastrointestinal system: Abdomen is nondistended, soft and nontender. No organomegaly or  masses felt. Normal bowel sounds heard. Central nervous system: Alert and oriented. No focal neurological deficits. Extremities: Bilateral lower extremity chronic lymphedema, significant right-sided redness and tenderness. Skin: No rashes, lesions or ulcers Psychiatry: Judgement and insight appear normal. Mood & affect appropriate.   Data Reviewed:  Lab results reviewed.  Family Communication:   Disposition: Status is: Inpatient Remains inpatient appropriate because: Severity of disease, IV antibiotics.  Planned Discharge Destination: Home with Home Health    Time spent: 50 minutes  Author: Sharen Hones, MD 05/12/2022 10:57 AM  For on call review [email or web address].

## 2022-05-12 NOTE — TOC Progression Note (Signed)
Transition of Care [facility]) - Progression Note    Patient Details  Name: [name] MRN: [id number] Date of Birth: 1955-07-24  Transition of Care [facility]) CM/SW [place], LCSW Phone Number: 05/12/2022, 8:53 AM  Clinical Narrative:    Patient is active with [facility] for PT, OT, and RN.    Expected Discharge Plan: [place] Barriers to Discharge: Continued Medical Work up  Expected Discharge Plan and Services Expected Discharge Plan: [place] arrangements for the past 2 months: Single Family Home                                       Social Determinants of Health (SDOH) Interventions    Readmission Risk Interventions    05/11/2022    9:56 AM 01/20/2021    9:48 AM  Readmission Risk Prevention Plan  Transportation Screening Complete Complete  PCP or Specialist Appt within 5-7 Days  Complete  PCP or Specialist Appt within 3-5 Days Complete   Home Care Screening  Complete  Medication Review (RN CM)  Referral to Pharmacy  HRI or [place] Complete   Social Work Consult for [place] Planning/Counseling Complete   Palliative Care Screening Not Applicable   Medication Review Press photographer) Complete

## 2022-05-13 DIAGNOSIS — L03119 Cellulitis of unspecified part of limb: Secondary | ICD-10-CM | POA: Diagnosis not present

## 2022-05-13 DIAGNOSIS — I5032 Chronic diastolic (congestive) heart failure: Secondary | ICD-10-CM | POA: Diagnosis not present

## 2022-05-13 DIAGNOSIS — A419 Sepsis, unspecified organism: Secondary | ICD-10-CM | POA: Diagnosis not present

## 2022-05-13 DIAGNOSIS — N179 Acute kidney failure, unspecified: Secondary | ICD-10-CM | POA: Diagnosis not present

## 2022-05-13 LAB — BASIC METABOLIC PANEL
Anion gap: 7 (ref 5–15)
BUN: 28 mg/dL — ABNORMAL HIGH (ref 8–23)
CO2: 28 mmol/L (ref 22–32)
Calcium: 8.1 mg/dL — ABNORMAL LOW (ref 8.9–10.3)
Chloride: 104 mmol/L (ref 98–111)
Creatinine, Ser: 1.19 mg/dL — ABNORMAL HIGH (ref 0.44–1.00)
GFR, Estimated: 50 mL/min — ABNORMAL LOW (ref >60)
Glucose, Bld: 93 mg/dL (ref 70–99)
Potassium: 3.2 mmol/L — ABNORMAL LOW (ref 3.5–5.1)
Sodium: 139 mmol/L (ref 135–145)

## 2022-05-13 LAB — CBC
HCT: 29.1 % — ABNORMAL LOW (ref 36.0–46.0)
Hemoglobin: 9.1 g/dL — ABNORMAL LOW (ref 12.0–15.0)
MCH: 26 pg (ref 26.0–34.0)
MCHC: 31.3 g/dL (ref 30.0–36.0)
MCV: 83.1 fL (ref 80.0–100.0)
Platelets: 137 10*3/uL — ABNORMAL LOW (ref 150–400)
RBC: 3.5 MIL/uL — ABNORMAL LOW (ref 3.87–5.11)
RDW: 14.7 % (ref 11.5–15.5)
WBC: 4.7 10*3/uL (ref 4.0–10.5)
nRBC: 0 % (ref 0.0–0.2)

## 2022-05-13 MED ORDER — POTASSIUM CHLORIDE 10 MEQ/100ML IV SOLN
10.0000 meq | INTRAVENOUS | Status: AC
  Administered 2022-05-13 (×2): 10 meq via INTRAVENOUS
  Filled 2022-05-13 (×3): qty 100

## 2022-05-13 MED ORDER — POTASSIUM CHLORIDE CRYS ER 20 MEQ PO TBCR
40.0000 meq | EXTENDED_RELEASE_TABLET | ORAL | Status: AC
  Administered 2022-05-13 (×2): 40 meq via ORAL
  Filled 2022-05-13 (×2): qty 2

## 2022-05-13 MED ORDER — SODIUM CHLORIDE 0.9 % IV SOLN
2.0000 g | INTRAVENOUS | Status: DC
  Administered 2022-05-13 – 2022-05-15 (×3): 2 g via INTRAVENOUS
  Filled 2022-05-13 (×4): qty 20

## 2022-05-13 NOTE — Progress Notes (Signed)
Progress Note   Patient: [name] MRN:[id number] DOB: 12/21/1955 DOA: 05/10/2022     3 DOS: the patient was seen and examined on 05/13/2022   Brief hospital course: 66-year-old female with past medical history of hereditary lymphedema, stage IIIb chronic kidney disease, obstructive sleep apnea and severe morbid obesity who is discharged from the hospital a little over a month ago for sim lower extremity cellulitis and discharged on p.o. antibiotics continue to have issues with persistent erythema, bullous lesions and drainage even after completing an extended antibiotic course, was sent over to the emergency room on 11/2 from her wound care clinic for worsening edema and drainage.  Patient evaluated and found to have cellulitis of the right lower extremity and admitted to the hospitalist service.  Lab work noteworthy for acute kidney injury with a creatinine of 1.66  (baseline around 1.4).  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  Following admission on afternoon of 11/2, patient had an event where she suddenly developed abdominal pain, became tachycardic, spiked blood pressure and then vomited and blood pressure became soft.  Repeat lab work noted jump of lactic acid from 1.7 up to 5.5. Patient started on aggressive fluid resuscitation and Rocephin changed to cefepime.  Follow-up blood work this morning noted white blood cell count of 18.7 (was 4.8 on admission) and procalcitonin level of 30.  During course of day of 11/3, patient's IV access was lost and unable to be replaced.  After discussion with critical care and vascular surgery, patient taken down to Cath Lab and vascular surgery placed central line.  IV antibiotics  resumed.  Also started on Zyvox as patient has allergy to vancomycin.  Assessment and Plan: Recurrent cellulitis of lower extremity Septic shock (HCC) Patient met criteria for  septic shock given lactic acidosis greater than 4, tachycardia, persistent hypotension, tachypnea and leukocytosis.  This occurred within 24 hours of emergency room presentation, but not on admission. Patient on my examination, patient had significant right lower extremity cellulitis, septic shock appear to be due to cellulitis Patient initially received vancomycin, now changed to cefepime and zyvox. Patient states that she had reaction to vancomycin last time in the hospital when he developed significant abdominal pain and low blood pressure.  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 Patient condition seem to be improving, due to severity of condition, all treated with current antibiotics until Tuesday. Patient blood pressure has been in the borderline, check cortisol level.   Acute kidney injury on stage IIIb chronic kidney disease Hypokalemia. Hypomagnesemia. Renal function has back to baseline, continue replete potassium.   Chronic diastolic CHF (congestive heart failure) (HCC) Patient currently does not have volume overload.   Hypokalemia Replacing as needed   Lymphedema Chronic.   OSA on CPAP - Cpap qhs ordered   Morbid obesity with BMI of 60.0-69.9, adult (HCC) Meets criteria, last BMI checked at 67.8      Subjective:  Patient doing better.  Leg swelling and pain much improved.  No fever or chills.  Physical Exam: Vitals:   05/12/22 1720 05/12/22 2027 05/13/22 0453 05/13/22 0715  BP: (!) 92/57 (!) 92/49 98/62 (!) 95/57  Pulse: 66 64 65 63  Resp: 18 16 18 16  Temp: 97.9 F (36.6 C) 98.3 F (36.8 C) 98 F (36.7 C) 97.6 F (36.4 C)  TempSrc: Oral Oral Oral   SpO2: 98% 98% 94% 95%  Weight:        Height:       General exam: Appears calm and comfortable, morbid obesity. Respiratory system: Clear to auscultation. Respiratory effort normal. Cardiovascular system: S1 & S2 heard, RRR. No JVD, murmurs, rubs, gallops or clicks. No pedal edema. Gastrointestinal  system: Abdomen is nondistended, soft and nontender. No organomegaly or masses felt. Normal bowel sounds heard. Central nervous system: Alert and oriented. No focal neurological deficits. Extremities: Lateral lower extremity edema, right lower extremity redness better. Skin: No rashes, lesions or ulcers Psychiatry:  Mood & affect appropriate.   Data Reviewed:  Lab results reviewed.  Family Communication: None  Disposition: Status is: Inpatient Remains inpatient appropriate because: Severity of disease, IV treatment.  Planned Discharge Destination: Home with Home Health    Time spent: 35 minutes  Author: Dekui Zhang, MD 05/13/2022 12:14 PM  For on call review [email or web address].  

## 2022-05-13 NOTE — Evaluation (Signed)
Occupational Therapy Evaluation Patient Details Name: [name] MRN: [id number] DOB: 1955-10-29 Today's Date: 05/13/2022   History of Present Illness Pt is a 66 year old female presenting to the ED from wound care clinic for worsening edema and drainage. Admitted with cellulitis of the right lower extremity, septic shock, AKI; PMH significant for hereditary lymphedema, stage IIIb chronic kidney disease, obstructive sleep apnea and severe morbid obesity   Clinical Impression   Pt is greeted in bed agreeable to OT evaluation. Pt is alert and oriented x4, fair awareness of deficits but appropriate problem solving when faced withADL task completion tasks. PTA pt was amb in house with no AD, recent use of RW due to recent hospitalizations. She sink bathes at baseline with MOD I, dressing with MOD I, has assistance for IADLs (friends get groceries). Pt presents with deficits in strength, endurance, activity tolerance, edema affecting safe and optimal ADL completion. Pt body habitus limits optimal mobility, however pt is able to perform bed mobility with MIN A with use of sheets as a leg lifter (has access to hospital bed at home), STS with CGA utilizing momentum, amb in room approx 6' with CGA with RW. MAX A required for LB dressing/peri hygiene however. Discussed with pt re: safe discharge plan, pt reports she will choose to refuse rehab at this time, prefers to discharge home to familiar environment, feels confident she can manage within her house. Discussed prn use of mwc if required, pt is in agreement. OT will continue to follow acutely.      Recommendations for follow up therapy are one component of a multi-disciplinary discharge planning process, led by the attending physician.  Recommendations may be updated based on patient status, additional functional criteria and insurance authorization.   Follow Up Recommendations  Home health OT (discussed recommendation of potential STR with pt as  she is performing ADL below PLOF, pt reports she will refuse)    Assistance Recommended at Discharge Frequent or constant Supervision/Assistance  Patient can return home with the following A lot of help with walking and/or transfers;A lot of help with bathing/dressing/bathroom    Functional Status Assessment  Patient has had a recent decline in their functional status and demonstrates the ability to make significant improvements in function in a reasonable and predictable amount of time.  Equipment Recommendations  Wheelchair (measurements OT)    Recommendations for Other Services       Precautions / Restrictions Precautions Precautions: Fall Restrictions Weight Bearing Restrictions: No      Mobility Bed Mobility Overal bed mobility: Needs Assistance Bed Mobility: Supine to Sit     Supine to sit: Supervision, Min assist     General bed mobility comments: assist for bed sheet placement to raise BLE, able to transition off bed, utilize bed features for supine>sit    Transfers Overall transfer level: Needs assistance Equipment used: Rolling walker (2 wheels) Transfers: Sit to/from Stand Sit to Stand: Min guard, +2 safety/equipment                  Balance Overall balance assessment: Needs assistance Sitting-balance support: Feet supported, Bilateral upper extremity supported Sitting balance-Leahy Scale: Good     Standing balance support: Bilateral upper extremity supported, During functional activity Standing balance-Leahy Scale: Fair                             ADL either performed or assessed with clinical judgement   ADL Overall  ADL's : Needs assistance/impaired Eating/Feeding: Set up;Sitting   Grooming: Set up;Sitting       Lower Body Bathing: Maximal assistance;Sit to/from stand Lower Body Bathing Details (indicate cue type and reason): due to body habitus     Lower Body Dressing: Maximal assistance Lower Body Dressing Details  (indicate cue type and reason): shoes, due to BLE pain, body habitus Toilet Transfer: Min guard;+2 for safety/equipment;Minimal assistance Toilet Transfer Details (indicate cue type and reason): simulated, step pivot transfer to bedside chair with RW Toileting- Clothing Manipulation and Hygiene: Maximal assistance       Functional mobility during ADLs: Min guard;+2 for safety/equipment;Rolling walker (2 wheels) (with bari RW, approx 6' in room)       Vision Patient Visual Report: No change from baseline       Perception     Praxis      Pertinent Vitals/Pain Pain Assessment Pain Assessment: Faces Faces Pain Scale: Hurts little more Pain Location: BLE Pain Descriptors / Indicators: Aching, Pins and needles Pain Intervention(s): Limited activity within patient's tolerance, Repositioned, Monitored during session (RN aware)     Hand Dominance     Extremity/Trunk Assessment Upper Extremity Assessment Upper Extremity Assessment: Overall WFL for tasks assessed   Lower Extremity Assessment Lower Extremity Assessment: Generalized weakness (edema throughout BLE)       Communication Communication Communication: No difficulties   Cognition Arousal/Alertness: Awake/alert Behavior During Therapy: WFL for tasks assessed/performed Overall Cognitive Status: Within Functional Limits for tasks assessed Area of Impairment: Safety/judgement                         Safety/Judgement: Decreased awareness of deficits     General Comments: Pt able to problem solve approrpiately for safe ADL completion however fair awareness of current level of function and deficits     General Comments  pt with edematous BLE, reports increase in size than typical baseline; pt also report numbness/tingling down lateral L thigh with noted sensation changes; team notified.    Exercises Other Exercises Other Exercises: edu re: role of OT, role of rehab, discharge recommendations, home safety,  falls prevention, DME use   Shoulder Instructions      Home Living Family/patient expects to be discharged to:: Private residence Living Arrangements: Children Available Help at Discharge: Family;Available PRN/intermittently (son lives in house) Type of Home: House Home Access: Stairs to enter [employer] of Steps: 1/2 step up to stoop, 1/2 step into door with stoop with R railing Entrance Stairs-Rails: Right Home Layout: One level     Bathroom Shower/Tub: Tub/shower unit;Sponge bathes at baseline   [employer]: Standard     Home Equipment: Cane - quad;Grab bars - tub/shower;Hand held shower head;Grab bars - toilet;Hospital bed;Toilet riser;Rollator (4 wheels);Rolling Walker (2 wheels)   Additional Comments: Lift chair/recliner      Prior Functioning/Environment Prior Level of Function : Independent/Modified Independent;Needs assist             Mobility Comments: no use of AE in house (PRN use of cane), use of RW due to recent hospitalization outside of house ADLs Comments: pt reports MOD I with ADLs, sponge bathes at baseline; Pt reports she can cook/lightly clean, assist with IADLs (driving, friends bring groceries at times)        OT Problem List: Decreased strength;Decreased activity tolerance;Decreased knowledge of use of DME or AE      OT Treatment/Interventions: Self-care/ADL training;Patient/family education;Therapeutic exercise;Balance training;DME and/or AE instruction;Therapeutic activities  OT Goals(Current goals can be found in the care plan section) Acute Rehab OT Goals Patient Stated Goal: go home OT Goal Formulation: With patient Time For Goal Achievement: 05/27/22 Potential to Achieve Goals: Good ADL Goals Pt Will Perform Grooming: with modified independence Pt Will Perform Lower Body Dressing: with min assist Pt Will Transfer to Toilet: with modified independence Pt Will Perform Toileting - Clothing Manipulation and hygiene: with  min assist  OT Frequency: Min 2X/week    Co-evaluation              AM-PAC OT "6 Clicks" Daily Activity     Outcome Measure Help from another person eating meals?: None Help from another person taking care of personal grooming?: None Help from another person toileting, which includes using toliet, bedpan, or urinal?: A Lot Help from another person bathing (including washing, rinsing, drying)?: A Lot Help from another person to put on and taking off regular upper body clothing?: None Help from another person to put on and taking off regular lower body clothing?: A Lot 6 Click Score: 18   End of Session Equipment Utilized During Treatment: Rolling walker (2 wheels) Nurse Communication: Mobility status  Activity Tolerance: Patient tolerated treatment well Patient left: in chair;with call bell/phone within reach  OT Visit Diagnosis: Unsteadiness on feet (R26.81);Muscle weakness (generalized) (M62.81)                Time: 5732-2025 OT Time Calculation (min): 31 min Charges:  OT General Charges $OT Visit: 1 Visit OT Evaluation $OT Eval Moderate Complexity: 1 Mod  Shanon Payor, OTD OTR/L  05/13/22, 1:36 PM

## 2022-05-13 NOTE — TOC Progression Note (Signed)
Transition of Care [facility]) - Progression Note    Patient Details  Name: [name] MRN: [id number] Date of Birth: March 10, 1956  Transition of Care [facility]) CM/SW [place], LCSW Phone Number: 05/13/2022, 1:27 PM  Clinical Narrative:    OT recommended Aide be added to [facility] orders. Notified MD and Tommi Rumps with [facility].  OT recommended wheelchair. Wheelchair ordered through [place] with [employer] to be delivered to patient's bedside tomorrow.    Expected Discharge Plan: [place] Barriers to Discharge: Continued Medical Work up  Expected Discharge Plan and Services Expected Discharge Plan: [place] arrangements for the past 2 months: Single Family Home                                       Social Determinants of Health (SDOH) Interventions    Readmission Risk Interventions    05/11/2022    9:56 AM 01/20/2021    9:48 AM  Readmission Risk Prevention Plan  Transportation Screening Complete Complete  PCP or Specialist Appt within 5-7 Days  Complete  PCP or Specialist Appt within 3-5 Days Complete   Home Care Screening  Complete  Medication Review (RN CM)  Referral to Pharmacy  HRI or [place] Complete   Social Work Consult for [place] Planning/Counseling Complete   Palliative Care Screening Not Applicable   Medication Review Press photographer) Complete

## 2022-05-13 NOTE — Evaluation (Signed)
Physical Therapy Evaluation Patient Details Name: [name] MRN: [id number] DOB: Feb 19, 1956 Today's Date: 05/13/2022  History of Present Illness  66-year-old female with past medical history of hereditary lymphedema, stage IIIb chronic kidney disease, obstructive sleep apnea and severe morbid obesity who is discharged from the hospital a little over a month ago for sim lower extremity cellulitis and discharged on p.o. antibiotics continue to have issues with persistent erythema, bullous lesions and drainage even after completing an extended antibiotic course, was sent over to the emergency room on 11/2 from her wound care clinic for worsening edema and drainage.  Patient evaluated and found to have cellulitis of the right lower extremity and admitted to the hospitalist service.  Lab work noteworthy for acute kidney injury with a creatinine of 1.66  (baseline around 1.4).  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     Following admission on afternoon of 11/2, patient had an event where she suddenly developed abdominal pain, became tachycardic, spiked blood pressure and then vomited and blood pressure became soft.  Clinical Impression  Pt is a pleasant 66 year old female who was admitted for cellulitis of B LEs with complaints of abdominal pain. B LEs wrapped-chronic however she is displeased as B feet are wrapped making it hard to don crocs or socks. Recent admission for similar symptoms 1 month ago and has been active with HHPT-just finished all sessions. Pt performs bed mobility with mod I and transfers with min assist and use of RW. Pt feels unable to ambulate this date due to B Knee pain and decreased ROM from being supine in bed. Per chart, pt was up in recliner via RN staff yesterday and demonstrates strength for transfers. Pt demonstrates deficits with strength/mobility/balance. Would benefit from skilled  PT to address above deficits and promote optimal return to PLOF. Recommend transition to [place] upon discharge from acute hospitalization.      Recommendations for follow up therapy are one component of a multi-disciplinary discharge planning process, led by the attending physician.  Recommendations may be updated based on patient status, additional functional criteria and insurance authorization.  Follow Up Recommendations Home health PT      Assistance Recommended at Discharge Intermittent Supervision/Assistance  Patient can return home with the following  A lot of help with walking and/or transfers;A little help with bathing/dressing/bathroom;Help with stairs or ramp for entrance    Equipment Recommendations None recommended by PT  Recommendations for Other Services       Functional Status Assessment Patient has had a recent decline in their functional status and demonstrates the ability to make significant improvements in function in a reasonable and predictable amount of time.     Precautions / Restrictions Precautions Precautions: Fall Restrictions Weight Bearing Restrictions: No      Mobility  Bed Mobility Overal bed mobility: Modified Independent             General bed mobility comments: able to utilize bed sheet as sling to assist with moving B LEs. Utilized bed functions to raise HOB prior to exiting bed. Able to slide self up in bed with mod I and bed controls    Transfers Overall transfer level: Needs assistance Equipment used: Rolling walker (2 wheels) Transfers: Bed to chair/wheelchair/BSC     Step pivot transfers: Min assist       General transfer comment: takes increased momentum to stand. B LE wrapping noted on B LEs  making it hard for her shoes to fit and provide traction. +2 required for blocking feet from slipping. Once standing, able to stand with supervision. Due to urgency, step pivot transfer onto [facility] for BM    Ambulation/Gait                General Gait Details: doesn't feel safe to ambulate due to B knees feeling stiff demonstrating decreased ROM.  Stairs            Wheelchair Mobility    Modified Rankin (Stroke Patients Only)       Balance Overall balance assessment: Needs assistance Sitting-balance support: Feet supported, Bilateral upper extremity supported Sitting balance-Leahy Scale: Good     Standing balance support: Bilateral upper extremity supported Standing balance-Leahy Scale: Fair                               Pertinent Vitals/Pain Pain Assessment Pain Assessment: Faces Faces Pain Scale: Hurts even more Pain Location: B knees Pain Descriptors / Indicators: Aching Pain Intervention(s): Limited activity within patient's tolerance, Repositioned    Home Living Family/patient expects to be discharged to:: Private residence Living Arrangements: Children Available Help at Discharge: Family;Available PRN/intermittently Type of Home: House Home Access: Stairs to enter Entrance Stairs-Rails: Right Entrance Stairs-Number of Steps: 1/2 step to stoop with R railing   Home Layout: One level Home Equipment: Cane - quad;Grab bars - tub/shower;Hand held shower head;Grab bars - toilet;Hospital bed;Toilet riser;Rollator (4 wheels);Rolling Walker (2 wheels) Additional Comments: Lift chair/recliner    Prior Function Prior Level of Function : Independent/Modified Independent             Mobility Comments: reports no recent falls, was using RW secondary to recent hospitalization ADLs Comments: indep with all ADLs, uses bath wipes as she is unable to get into shower. She cooks for herself and doesn't drive     Hand Dominance        Extremity/Trunk Assessment   Upper Extremity Assessment Upper Extremity Assessment: Overall WFL for tasks assessed    Lower Extremity Assessment Lower Extremity Assessment: Generalized weakness (B LE grossly 4/5 in bed, however decreased  functional strength. B decreased knee flexion, keeps in extension)       Communication   Communication: No difficulties  Cognition Arousal/Alertness: Awake/alert Behavior During Therapy: WFL for tasks assessed/performed Overall Cognitive Status: Within Functional Limits for tasks assessed                                 General Comments: pleasant and agreeable to session        General Comments      Exercises Other Exercises Other Exercises: ambulated over to [facility] as she didn't feel up to ambulating to bathroom. Pt able to transfer on/off with min assist and 2nd person used for hygiene.   Assessment/Plan    PT Assessment Patient needs continued PT services  PT Problem List Decreased strength;Decreased activity tolerance;Decreased balance;Decreased mobility;Pain;Obesity       PT Treatment Interventions Gait training;DME instruction;Therapeutic exercise    PT Goals (Current goals can be found in the Care Plan section)  Acute Rehab PT Goals Patient Stated Goal: to go home PT Goal Formulation: With patient Time For Goal Achievement: 05/27/22 Potential to Achieve Goals: Good    Frequency Min 2X/week     Co-evaluation  AM-PAC PT "6 Clicks" Mobility  Outcome Measure Help needed turning from your back to your side while in a flat bed without using bedrails?: None Help needed moving from lying on your back to sitting on the side of a flat bed without using bedrails?: None Help needed moving to and from a bed to a chair (including a wheelchair)?: A Little Help needed standing up from a chair using your arms (e.g., wheelchair or bedside chair)?: A Little Help needed to walk in hospital room?: A Lot Help needed climbing 3-5 steps with a railing? : Total 6 Click Score: 17    End of Session   Activity Tolerance: Patient tolerated treatment well Patient left: in bed;with bed alarm set Nurse Communication: Mobility status PT Visit Diagnosis:  Unsteadiness on feet (R26.81);Muscle weakness (generalized) (M62.81);Difficulty in walking, not elsewhere classified (R26.2);Pain Pain - Right/Left:  (bilat) Pain - part of body: Knee    Time: 1751-0258 PT Time Calculation (min) (ACUTE ONLY): 36 min   Charges:   PT Evaluation $PT Eval Moderate Complexity: 1 Mod PT Treatments $Therapeutic Activity: 8-22 mins        Greggory Stallion, PT, DPT, GCS [phone]   Ahyan Kreeger 05/13/2022, 11:49 AM

## 2022-05-14 ENCOUNTER — Encounter: Payer: Self-pay | Admitting: Vascular Surgery

## 2022-05-14 DIAGNOSIS — R6521 Severe sepsis with septic shock: Secondary | ICD-10-CM | POA: Diagnosis not present

## 2022-05-14 DIAGNOSIS — N179 Acute kidney failure, unspecified: Secondary | ICD-10-CM | POA: Diagnosis not present

## 2022-05-14 DIAGNOSIS — L03119 Cellulitis of unspecified part of limb: Secondary | ICD-10-CM | POA: Diagnosis not present

## 2022-05-14 DIAGNOSIS — A419 Sepsis, unspecified organism: Secondary | ICD-10-CM | POA: Diagnosis not present

## 2022-05-14 LAB — BASIC METABOLIC PANEL
Anion gap: 5 (ref 5–15)
BUN: 23 mg/dL (ref 8–23)
CO2: 29 mmol/L (ref 22–32)
Calcium: 8.2 mg/dL — ABNORMAL LOW (ref 8.9–10.3)
Chloride: 104 mmol/L (ref 98–111)
Creatinine, Ser: 1.24 mg/dL — ABNORMAL HIGH (ref 0.44–1.00)
GFR, Estimated: 48 mL/min — ABNORMAL LOW (ref >60)
Glucose, Bld: 85 mg/dL (ref 70–99)
Potassium: 3.9 mmol/L (ref 3.5–5.1)
Sodium: 138 mmol/L (ref 135–145)

## 2022-05-14 LAB — CBC
HCT: 29 % — ABNORMAL LOW (ref 36.0–46.0)
Hemoglobin: 9 g/dL — ABNORMAL LOW (ref 12.0–15.0)
MCH: 25.8 pg — ABNORMAL LOW (ref 26.0–34.0)
MCHC: 31 g/dL (ref 30.0–36.0)
MCV: 83.1 fL (ref 80.0–100.0)
Platelets: 159 10*3/uL (ref 150–400)
RBC: 3.49 MIL/uL — ABNORMAL LOW (ref 3.87–5.11)
RDW: 14.7 % (ref 11.5–15.5)
WBC: 4.6 10*3/uL (ref 4.0–10.5)
nRBC: 0 % (ref 0.0–0.2)

## 2022-05-14 LAB — CORTISOL-AM, BLOOD: Cortisol - AM: 9.2 ug/dL (ref 6.7–22.6)

## 2022-05-14 LAB — PHOSPHORUS: Phosphorus: 2.4 mg/dL — ABNORMAL LOW (ref 2.5–4.6)

## 2022-05-14 LAB — MAGNESIUM: Magnesium: 2 mg/dL (ref 1.7–2.4)

## 2022-05-14 NOTE — Care Management Important Message (Signed)
Important Message  Patient Details  Name: [name] MRN: [id number] Date of Birth: Nov 25, 1955   Medicare Important Message Given:  Yes     Dannette Barbara 05/14/2022, 10:14 AM

## 2022-05-14 NOTE — Progress Notes (Signed)
Progress Note   Patient: [name] KZL:[id number] DOB: Nov 11, 1955 DOA: 05/10/2022     4 DOS: the patient was seen and examined on 05/14/2022   Brief hospital course: 66 year old female with past medical history of hereditary lymphedema, stage IIIb chronic kidney disease, obstructive sleep apnea and severe morbid obesity who is discharged from the hospital a little over a month ago for sim lower extremity cellulitis and discharged on p.o. antibiotics continue to have issues with persistent erythema, bullous lesions and drainage even after completing an extended antibiotic course, was sent over to the emergency room on 11/2 from her wound care clinic for worsening edema and drainage.  Patient evaluated and found to have cellulitis of the right lower extremity and admitted to the hospitalist service.  Lab work noteworthy for acute kidney injury with a creatinine of 1.66  (baseline around 1.4).  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  Following admission on afternoon of 11/2, patient had an event where she suddenly developed abdominal pain, became tachycardic, spiked blood pressure and then vomited and blood pressure became soft.  Repeat lab work noted jump of lactic acid from 1.7 up to 5.5. Patient started on aggressive fluid resuscitation and Rocephin changed to cefepime.  Follow-up blood work this morning noted white blood cell count of 18.7 (was 4.8 on admission) and procalcitonin level of 30.  During course of day of 11/3, patient's IV access was lost and unable to be replaced.  After discussion with critical care and vascular surgery, patient taken down to Cath Lab and vascular surgery placed central line.  IV antibiotics  resumed.  Also started on Zyvox as patient has allergy to vancomycin.  Assessment and Plan:  Recurrent cellulitis of lower extremity Septic shock [facility]) Patient met criteria for  septic shock given lactic acidosis greater than 4, tachycardia, persistent hypotension, tachypnea and leukocytosis.  This occurred within 24 hours of emergency room presentation, but not on admission. Patient on my examination, patient had significant right lower extremity cellulitis, septic shock appear to be due to cellulitis Patient initially received vancomycin, now changed to cefepime and zyvox. Patient states that she had reaction to vancomycin last time in the hospital when he developed significant abdominal pain and low blood pressure.  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 Patient condition continued to improve, cortisol level 9.2, no significant adrenal insufficiency.  Blood pressure is also more stable.  We will continue current antibiotics, planning to discharge home tomorrow with oral antibiotics.   Acute kidney injury on stage IIIb chronic kidney disease Hypokalemia. Hypomagnesemia. Potassium magnesium normalized per renal function stabilized.   Chronic diastolic CHF (congestive heart failure) (HCC) Still stable.    Lymphedema Chronic.   OSA on CPAP - Cpap qhs ordered   Morbid obesity with BMI of 60.0-69.9, adult (HCC) Meets criteria, last BMI checked at 67.8     Subjective:  Patient doing well, no fever or chills.  Had a normal bowel movement.  Physical Exam: Vitals:   05/13/22 1543 05/13/22 2000 05/14/22 0426 05/14/22 0804  BP:  118/68 (!) 99/59 90/73  Pulse:  63 71 71  Resp:  _0 Temp:  98.5 F (36.9 C) 98.6 F (37 C) 97.9 F (36.6 C)  TempSrc:  Oral Oral Oral  SpO2: 99% 98% 96% 96%  Weight:      Height:       General exam: Appears calm and  comfortable, morbid obese. Respiratory system: Clear to auscultation. Respiratory effort normal. Cardiovascular system: S1 & S2 heard, RRR. No JVD, murmurs, rubs, gallops or clicks.  Gastrointestinal system: Abdomen is nondistended, soft and nontender. No organomegaly or masses felt. Normal bowel sounds  heard. Central nervous system: Alert and oriented. No focal neurological deficits. Extremities: Chronic bilateral lower extremity lymphedema, redness and tenderness much improved. Skin: No rashes, lesions or ulcers Psychiatry: Judgement and insight appear normal. Mood & affect appropriate.   Data Reviewed:  Lab results reviewed.  Family Communication: None  Disposition: Status is: Inpatient Remains inpatient appropriate because: Severity of disease, IV treatment.  Planned Discharge Destination: Home with Home Health    Time spent: 35 minutes  Author: Sharen Hones, MD 05/14/2022 12:49 PM  For on call review [email or web address].

## 2022-05-14 NOTE — Progress Notes (Signed)
Occupational Therapy Treatment Patient Details Name: [name] MRN: [id number] DOB: Jul 23, 1955 Today's Date: 05/14/2022   History of present illness Pt is a 66 year old female presenting to the ED from wound care clinic for worsening edema and drainage. Admitted with cellulitis of the right lower extremity, septic shock, AKI; PMH significant for hereditary lymphedema, stage IIIb chronic kidney disease, obstructive sleep apnea and severe morbid obesity   OT comments  Pt received semi-reclined in bed. Appearing alert and pleasant; willing to work with OT on functional mobility in the room. T/f MOD (I) with extra time and use of rails and HOB raised. See flowsheet below for further details of session. Left semi-reclined with all needs in reach; RN in room giving medications. Pt reiterated her desire to return home with HHOT instead of SNF.     Recommendations for follow up therapy are one component of a multi-disciplinary discharge planning process, led by the attending physician.  Recommendations may be updated based on patient status, additional functional criteria and insurance authorization.    Follow Up Recommendations  Home health OT    Assistance Recommended at Discharge Frequent or constant Supervision/Assistance  Patient can return home with the following  A lot of help with bathing/dressing/bathroom;Assistance with cooking/housework;Assist for transportation   Equipment Recommendations       Recommendations for Other Services      Precautions / Restrictions Precautions Precautions: Fall Restrictions Weight Bearing Restrictions: No       Mobility Bed Mobility Overal bed mobility: Modified Independent Bed Mobility: Supine to Sit, Sit to Supine     Supine to sit: Modified independent (Device/Increase time), HOB elevated (heavily using bedrails) Sit to supine: Modified independent (Device/Increase time), HOB elevated (with pt using a sheet as a leg lifter)         Transfers Overall transfer level: Modified independent Equipment used: Rolling walker (2 wheels) Transfers: Sit to/from Stand Sit to Stand: Modified independent (Device/Increase time)           General transfer comment: Patient then performed functional mobility 10 feet to the door, looked out into hallway, and 10 feet back to bed, RW, MOD (I); slowly; no loss of balance.     Balance                                           ADL either performed or assessed with clinical judgement   ADL                                         General ADL Comments: Pt states she has already done toileting today, but willing to work with OT on functional  mobility in the room. Requires OT assist for donning slip-on shoes    Extremity/Trunk Assessment Upper Extremity Assessment Upper Extremity Assessment: Overall WFL for tasks assessed (Pt using BIL UE functionally today for pulling on rails, using leg lifter to move BIL LE into bed, and using walker during mobility.)   Lower Extremity Assessment Lower Extremity Assessment: Generalized weakness (significant edema; not much knee flexion bilaterally.)        Vision       Perception     Praxis      Cognition Arousal/Alertness: Awake/alert Behavior During Therapy: WFL for tasks assessed/performed Overall Cognitive Status:  Within Functional Limits for tasks assessed                                          Exercises      Shoulder Instructions       General Comments      Pertinent Vitals/ Pain       Pain Assessment Pain Assessment: 0-10 Pain Score:  (unrated) Pain Location: generalized BLE Pain Descriptors / Indicators: Aching  Home Living                                          Prior Functioning/Environment              Frequency  Min 2X/week        Progress Toward Goals  OT Goals(current goals can now be found in the care plan  section)  Progress towards OT goals: Progressing toward goals  Acute Rehab OT Goals Patient Stated Goal: Go home. OT Goal Formulation: With patient Time For Goal Achievement: 05/27/22 Potential to Achieve Goals: Good ADL Goals Pt Will Perform Grooming: with modified independence Pt Will Perform Lower Body Dressing: with min assist Pt Will Transfer to Toilet: with modified independence Pt Will Perform Toileting - Clothing Manipulation and hygiene: with min assist  Plan Discharge plan remains appropriate    Co-evaluation                 AM-PAC OT "6 Clicks" Daily Activity     Outcome Measure   Help from another person eating meals?: None Help from another person taking care of personal grooming?: None Help from another person toileting, which includes using toliet, bedpan, or urinal?: A Lot Help from another person bathing (including washing, rinsing, drying)?: A Lot Help from another person to put on and taking off regular upper body clothing?: None Help from another person to put on and taking off regular lower body clothing?: A Lot 6 Click Score: 18    End of Session Equipment Utilized During Treatment: Rolling walker (2 wheels);Other (comment) (bed sheet used as a leg lifter)  OT Visit Diagnosis: Unsteadiness on feet (R26.81);Muscle weakness (generalized) (M62.81)   Activity Tolerance Patient tolerated treatment well   Patient Left in bed;Other (comment) (RN in room giving medications)   Nurse Communication Mobility status        Time: 3086-5784 OT Time Calculation (min): 24 min  Charges: OT General Charges $OT Visit: 1 Visit OT Treatments $Therapeutic Activity: 23-37 mins  [name], MS, OTR/L   [name] 05/14/2022, 3:19 PM

## 2022-05-15 ENCOUNTER — Inpatient Hospital Stay: Payer: Medicare HMO

## 2022-05-15 DIAGNOSIS — L03119 Cellulitis of unspecified part of limb: Secondary | ICD-10-CM | POA: Diagnosis not present

## 2022-05-15 DIAGNOSIS — R6521 Severe sepsis with septic shock: Secondary | ICD-10-CM | POA: Diagnosis not present

## 2022-05-15 DIAGNOSIS — N179 Acute kidney failure, unspecified: Secondary | ICD-10-CM | POA: Diagnosis not present

## 2022-05-15 DIAGNOSIS — A419 Sepsis, unspecified organism: Secondary | ICD-10-CM | POA: Diagnosis not present

## 2022-05-15 LAB — BASIC METABOLIC PANEL
Anion gap: 5 (ref 5–15)
BUN: 19 mg/dL (ref 8–23)
CO2: 29 mmol/L (ref 22–32)
Calcium: 8.6 mg/dL — ABNORMAL LOW (ref 8.9–10.3)
Chloride: 106 mmol/L (ref 98–111)
Creatinine, Ser: 1.14 mg/dL — ABNORMAL HIGH (ref 0.44–1.00)
GFR, Estimated: 53 mL/min — ABNORMAL LOW (ref >60)
Glucose, Bld: 91 mg/dL (ref 70–99)
Potassium: 4.2 mmol/L (ref 3.5–5.1)
Sodium: 140 mmol/L (ref 135–145)

## 2022-05-15 LAB — PHOSPHORUS: Phosphorus: 2.5 mg/dL (ref 2.5–4.6)

## 2022-05-15 LAB — MAGNESIUM: Magnesium: 1.8 mg/dL (ref 1.7–2.4)

## 2022-05-15 MED ORDER — IOHEXOL 300 MG/ML  SOLN
100.0000 mL | Freq: Once | INTRAMUSCULAR | Status: AC | PRN
  Administered 2022-05-15: 100 mL via INTRAVENOUS

## 2022-05-15 MED ORDER — LACTATED RINGERS IV SOLN: INTRAVENOUS | Status: AC

## 2022-05-15 NOTE — Progress Notes (Signed)
Physical Therapy Treatment Patient Details Name: [name] MRN: [id number] DOB: [birth year] Today's Date: 05/15/2022   History of Present Illness Pt is a 66 year old female presenting to the ED from wound care clinic for worsening edema and drainage. Admitted with cellulitis of the right lower extremity, septic shock, AKI; PMH significant for hereditary lymphedema, stage IIIb chronic kidney disease, obstructive sleep apnea and severe morbid obesity    PT Comments    Pt received seated in recliner with OT exiting. Pt stands mod-I to BRW and ambulating ~100' in hallway. Trunk flexion on BRW needed with 2-3 standing rest breaks, Decreased foot clearance in swing phase. Overall pt reports near baseline. Supervision throughout with safe use of BRW with functional leg strength to complete this distance. Pt returning to recliner with all needs in reach. Agreeable to HHPT.     Recommendations for follow up therapy are one component of a multi-disciplinary discharge planning process, led by the attending physician.  Recommendations may be updated based on patient status, additional functional criteria and insurance authorization.  Follow Up Recommendations  Home health PT     Assistance Recommended at Discharge Intermittent Supervision/Assistance  Patient can return home with the following A little help with walking and/or transfers;A little help with bathing/dressing/bathroom;Help with stairs or ramp for entrance;Assistance with cooking/housework   Equipment Recommendations  None recommended by PT    Recommendations for Other Services       Precautions / Restrictions Precautions Precautions: Fall Restrictions Weight Bearing Restrictions: No     Mobility  Bed Mobility               General bed mobility comments: NT. in Recliner pre and post testing. Patient Response: Cooperative  Transfers Overall transfer level: Modified independent Equipment used: Rolling walker (2  wheels) Transfers: Sit to/from Stand Sit to Stand: Modified independent (Device/Increase time)                Ambulation/Gait Ambulation/Gait assistance: Supervision Gait Distance (Feet): 100 Feet Assistive device: Rolling walker (2 wheels) Gait Pattern/deviations: Step-to pattern, Decreased stride length, Trunk flexed       General Gait Details: Performed with BRW with 2-3 standing rest breaks. Reports close to her baseline.   Stairs             Wheelchair Mobility    Modified Rankin (Stroke Patients Only)       Balance Overall balance assessment: Needs assistance Sitting-balance support: Feet supported, Bilateral upper extremity supported Sitting balance-Leahy Scale: Good     Standing balance support: Bilateral upper extremity supported, During functional activity Standing balance-Leahy Scale: Fair                              Cognition Arousal/Alertness: Awake/alert Behavior During Therapy: WFL for tasks assessed/performed Overall Cognitive Status: Within Functional Limits for tasks assessed                                          Exercises      General Comments        Pertinent Vitals/Pain Pain Assessment Pain Assessment: No/denies pain    Home Living                          Prior Function  PT Goals (current goals can now be found in the care plan section) Acute Rehab PT Goals Patient Stated Goal: to go home PT Goal Formulation: With patient Time For Goal Achievement: 05/27/22 Potential to Achieve Goals: Good Progress towards PT goals: Progressing toward goals    Frequency    Min 2X/week      PT Plan Current plan remains appropriate    Co-evaluation              AM-PAC PT "6 Clicks" Mobility   Outcome Measure  Help needed turning from your back to your side while in a flat bed without using bedrails?: None Help needed moving from lying on your back to sitting on  the side of a flat bed without using bedrails?: None Help needed moving to and from a bed to a chair (including a wheelchair)?: A Little Help needed standing up from a chair using your arms (e.g., wheelchair or bedside chair)?: A Little Help needed to walk in hospital room?: A Little Help needed climbing 3-5 steps with a railing? : Total 6 Click Score: 18    End of Session Equipment Utilized During Treatment: Gait belt Activity Tolerance: Patient tolerated treatment well Patient left: in chair;with call bell/phone within reach Nurse Communication: Mobility status PT Visit Diagnosis: Unsteadiness on feet (R26.81);Muscle weakness (generalized) (M62.81);Difficulty in walking, not elsewhere classified (R26.2);Pain     Time: 2876-8115 PT Time Calculation (min) (ACUTE ONLY): 18 min  Charges:  $Therapeutic Exercise: 8-22 mins                    Salem Caster. Fairly IV, PT, DPT Physical Therapist- [facility]  05/15/2022, 3:59 PM

## 2022-05-15 NOTE — Progress Notes (Signed)
Mobility Specialist - Progress Note   05/15/22 1100  Mobility  Activity Transferred to/from [facility];Transferred from bed to chair  Level of Assistance Standby assist, set-up cues, supervision of patient - no hands on  Assistive Device Front wheel walker  Distance Ambulated (ft) 5 ft  Activity Response Tolerated well  $Mobility charge 1 Mobility     Pt lying in bed upon arrival, utilizing RA. Used looped sheet onto LE to exit bed. Able to perform STS and ambulate towards BSC with minG. Pt has own technique for transfers that is relatively safe, only requiring set-up assist from author. +2 only for safety. Pt reports LE feeling stronger today. Pt left in chair with alarm set, needs in reach.   Kathee Delton Mobility Specialist 05/15/22, 12:02 PM

## 2022-05-15 NOTE — Progress Notes (Signed)
Occupational Therapy Treatment Patient Details Name: [name] MRN: [id number] DOB: July 07, 1956 Today's Date: 05/15/2022   History of present illness Pt is a 66 year old female presenting to the ED from wound care clinic for worsening edema and drainage. Admitted with cellulitis of the right lower extremity, septic shock, AKI; PMH significant for hereditary lymphedema, stage IIIb chronic kidney disease, obstructive sleep apnea and severe morbid obesity   OT comments  Pt received semi-reclined in bed. Appearing alert and cheerful; willing to work with OT on transfer to chair and toilet hygiene. See flowsheet below for further details of session. Left seated in recliner, PT in room preparing to assist pt with further mobility, and with all needs in reach.     Recommendations for follow up therapy are one component of a multi-disciplinary discharge planning process, led by the attending physician.  Recommendations may be updated based on patient status, additional functional criteria and insurance authorization.    Follow Up Recommendations  Home health OT    Assistance Recommended at Discharge Frequent or constant Supervision/Assistance  Patient can return home with the following  A little help with bathing/dressing/bathroom;Assistance with cooking/housework   Equipment Recommendations       Recommendations for Other Services      Precautions / Restrictions Precautions Precautions: Fall Restrictions Weight Bearing Restrictions: No       Mobility Bed Mobility Overal bed mobility: Modified Independent Bed Mobility: Supine to Sit     Supine to sit: Modified independent (Device/Increase time), HOB elevated          Transfers Overall transfer level: Modified independent Equipment used: Rolling walker (2 wheels) Transfers: Sit to/from Stand Sit to Stand: Modified independent (Device/Increase time), From elevated surface (extra time)                 Balance                                            ADL either performed or assessed with clinical judgement   ADL Overall ADL's : Needs assistance/impaired                     Lower Body Dressing: Maximal assistance (OT assisted to don slip-on shoes while in bed level)       Toileting- Clothing Manipulation and Hygiene: Moderate assistance;Sit to/from stand Toileting - Clothing Manipulation Details (indicate cue type and reason): pt able to wipe from standing (RW in front for safety) due to slight bowel incontinence; however, pt unable to reach back far enough for thoroughness. OT provided recommendation for toilet aid (showed pt on the internet on her tablet and explained their use).       General ADL Comments: Able to stand with bed rail and multiple attempts with RW (OT stabilizing RW) and turn to t/f to chair next to bed. Left seated in recliner; PT in room ready to work with pt on mobility.    Extremity/Trunk Assessment Upper Extremity Assessment Upper Extremity Assessment: Overall WFL for tasks assessed   Lower Extremity Assessment Lower Extremity Assessment: Generalized weakness        Vision       Perception     Praxis      Cognition Arousal/Alertness: Awake/alert Behavior During Therapy: WFL for tasks assessed/performed Overall Cognitive Status: Within Functional Limits for tasks assessed  Exercises      Shoulder Instructions       General Comments      Pertinent Vitals/ Pain       Pain Assessment Pain Assessment: No/denies pain  Home Living                                          Prior Functioning/Environment              Frequency  Min 2X/week        Progress Toward Goals  OT Goals(current goals can now be found in the care plan section)  Progress towards OT goals: Progressing toward goals  Acute Rehab OT Goals Patient Stated Goal: go  home OT Goal Formulation: With patient Time For Goal Achievement: 05/27/22 Potential to Achieve Goals: Good ADL Goals Pt Will Perform Grooming: with modified independence Pt Will Perform Lower Body Dressing: with min assist Pt Will Transfer to Toilet: with modified independence Pt Will Perform Toileting - Clothing Manipulation and hygiene: with min assist  Plan Discharge plan remains appropriate    Co-evaluation                 AM-PAC OT "6 Clicks" Daily Activity     Outcome Measure   Help from another person eating meals?: None Help from another person taking care of personal grooming?: None Help from another person toileting, which includes using toliet, bedpan, or urinal?: A Little Help from another person bathing (including washing, rinsing, drying)?: A Lot Help from another person to put on and taking off regular upper body clothing?: None Help from another person to put on and taking off regular lower body clothing?: A Lot 6 Click Score: 19    End of Session Equipment Utilized During Treatment: Rolling walker (2 wheels)  OT Visit Diagnosis: Unsteadiness on feet (R26.81);Muscle weakness (generalized) (M62.81)   Activity Tolerance Patient tolerated treatment well   Patient Left in chair   Nurse Communication Mobility status;Other (comment) (need for new purewick and new sacral dressing)        Time: 9597-4718 OT Time Calculation (min): 23 min  Charges: OT General Charges $OT Visit: 1 Visit OT Treatments $Self Care/Home Management : 23-37 mins  [name], MS, OTR/L   [name] 05/15/2022, 3:39 PM

## 2022-05-15 NOTE — Progress Notes (Signed)
PT Cancellation Note  Patient Details Name: [name] MRN: [id number] DOB: [birth year]   Cancelled Treatment:    Reason Eval/Treat Not Completed: Patient at procedure or test/unavailable. Chart reviewed. Upon entry to room, pt being transported to CT. Will re-attempt this p.m. as able.   Salem Caster. Fairly IV, PT, DPT Physical Therapist- [facility]  05/15/2022, 11:30 AM

## 2022-05-15 NOTE — Progress Notes (Signed)
Progress Note   Patient: [name] MRN:[id number] DOB: 03/05/1956 DOA: 05/10/2022     5 DOS: the patient was seen and examined on 05/15/2022   Brief hospital course: 66-year-old female with past medical history of hereditary lymphedema, stage IIIb chronic kidney disease, obstructive sleep apnea and severe morbid obesity who is discharged from the hospital a little over a month ago for sim lower extremity cellulitis and discharged on p.o. antibiotics continue to have issues with persistent erythema, bullous lesions and drainage even after completing an extended antibiotic course, was sent over to the emergency room on 11/2 from her wound care clinic for worsening edema and drainage.  Patient evaluated and found to have cellulitis of the right lower extremity and admitted to the hospitalist service.  Lab work noteworthy for acute kidney injury with a creatinine of 1.66  (baseline around 1.4).  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  Following admission on afternoon of 11/2, patient had an event where she suddenly developed abdominal pain, became tachycardic, spiked blood pressure and then vomited and blood pressure became soft.  Repeat lab work noted jump of lactic acid from 1.7 up to 5.5. Patient started on aggressive fluid resuscitation and Rocephin changed to cefepime.  Follow-up blood work this morning noted white blood cell count of 18.7 (was 4.8 on admission) and procalcitonin level of 30.  During course of day of 11/3, patient's IV access was lost and unable to be replaced.  After discussion with critical care and vascular surgery, patient taken down to Cath Lab and vascular surgery placed central line.  IV antibiotics  resumed.  Also started on Zyvox as patient has allergy to vancomycin.  Assessment and Plan: Recurrent cellulitis of lower extremity Septic shock (HCC) Patient met criteria for  septic shock given lactic acidosis greater than 4, tachycardia, persistent hypotension, tachypnea and leukocytosis.  This occurred within 24 hours of emergency room presentation, but not on admission. Patient on my examination, patient had significant right lower extremity cellulitis, septic shock appear to be due to cellulitis Patient initially received vancomycin, now changed to cefepime and zyvox. Patient states that she had reaction to vancomycin last time in the hospital when he developed significant abdominal pain and low blood pressure.  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 Patient condition continued to improve, cortisol level 9.2, no significant adrenal insufficiency.  Blood pressure is also more stable.   Patient leg swelling or redness much improved on my examination today.  However, distal right lower extremity still has significant swelling, not much tenderness left.  However I am not sure patient has abscess as I cannot tell whether there is fluctuance.  Obtained a plain x-ray, could not tell.  As result, I opted to perform a CT scan with IV contrast on the right lower extremity. Patient can be discharged home tomorrow if no abscess present, and renal function stable after IV contrast.  Acute kidney injury on stage IIIb chronic kidney disease Hypokalemia. Hypomagnesemia. Potassium magnesium normalized per renal function stabilized. Due to IV contrast today, I will give a small dose of lactated Ringer's solution tonight.  Recheck renal function tomorrow.   Chronic diastolic CHF (congestive heart failure) (HCC) Still stable.     Lymphedema Chronic.   OSA on CPAP - Cpap qhs ordered   Morbid obesity with BMI of 60.0-69.9, adult (HCC) Meets criteria, last BMI checked at 67.8          Subjective:  Patient feels much better today, no complaints.  Physical Exam: Vitals:   05/14/22 1617 05/14/22 1926 05/15/22 0424 05/15/22 0746  BP: 138/78 116/69 114/74 (!) 119/59   Pulse: (!) 59 67 64 68  Resp: _0 Temp: 98.4 F (36.9 C) 98.4 F (36.9 C) 98 F (36.7 C) 98 F (36.7 C)  TempSrc: Oral Oral Oral Oral  SpO2: 95% 95% 94% 96%  Weight:      Height:       General exam: Appears calm and comfortable, morbid obesity. Respiratory system: Clear to auscultation. Respiratory effort normal. Cardiovascular system: S1 & S2 heard, RRR. No JVD, murmurs, rubs, gallops or clicks.  Gastrointestinal system: Abdomen is nondistended, soft and nontender. No organomegaly or masses felt. Normal bowel sounds heard. Central nervous system: Alert and oriented. No focal neurological deficits. Extremities: Bilateral lower extremity lymphedema.  Right distal lower extremity much improved.  However, it appears to have some fluctuance. Skin: No rashes, lesions or ulcers Psychiatry: Judgement and insight appear normal. Mood & affect appropriate.   Data Reviewed:  Lab results reviewed.  Family Communication: None  Disposition: Status is: Inpatient Remains inpatient appropriate because:Severity of disease, IV treatment.  Planned Discharge Destination: Home with Home Health    Time spent: 35 minutes  Author: Sharen Hones, MD 05/15/2022 12:55 PM  For on call review [email or web address].

## 2022-05-16 DIAGNOSIS — L03119 Cellulitis of unspecified part of limb: Secondary | ICD-10-CM | POA: Diagnosis not present

## 2022-05-16 LAB — CBC
HCT: 29.7 % — ABNORMAL LOW (ref 36.0–46.0)
Hemoglobin: 9.1 g/dL — ABNORMAL LOW (ref 12.0–15.0)
MCH: 25.9 pg — ABNORMAL LOW (ref 26.0–34.0)
MCHC: 30.6 g/dL (ref 30.0–36.0)
MCV: 84.6 fL (ref 80.0–100.0)
Platelets: 155 10*3/uL (ref 150–400)
RBC: 3.51 MIL/uL — ABNORMAL LOW (ref 3.87–5.11)
RDW: 14.6 % (ref 11.5–15.5)
WBC: 4.4 10*3/uL (ref 4.0–10.5)
nRBC: 0 % (ref 0.0–0.2)

## 2022-05-16 LAB — MAGNESIUM: Magnesium: 1.6 mg/dL — ABNORMAL LOW (ref 1.7–2.4)

## 2022-05-16 LAB — BASIC METABOLIC PANEL
Anion gap: 5 (ref 5–15)
BUN: 19 mg/dL (ref 8–23)
CO2: 30 mmol/L (ref 22–32)
Calcium: 8.6 mg/dL — ABNORMAL LOW (ref 8.9–10.3)
Chloride: 107 mmol/L (ref 98–111)
Creatinine, Ser: 1.02 mg/dL — ABNORMAL HIGH (ref 0.44–1.00)
GFR, Estimated: >60 mL/min (ref >60)
Glucose, Bld: 91 mg/dL (ref 70–99)
Potassium: 4.3 mmol/L (ref 3.5–5.1)
Sodium: 142 mmol/L (ref 135–145)

## 2022-05-16 MED ORDER — CEFADROXIL 500 MG PO CAPS: 500.0000 mg | ORAL_CAPSULE | Freq: Two times a day (BID) | ORAL | 0 refills | Status: DC

## 2022-05-16 MED ORDER — MIDODRINE HCL 10 MG PO TABS: 10.0000 mg | ORAL_TABLET | Freq: Three times a day (TID) | ORAL | 0 refills | Status: AC

## 2022-05-16 MED ORDER — MAGNESIUM OXIDE -MG SUPPLEMENT 400 (240 MG) MG PO TABS: 400.0000 mg | ORAL_TABLET | Freq: Two times a day (BID) | ORAL | 0 refills | Status: AC

## 2022-05-16 MED ORDER — CEPHALEXIN 500 MG PO CAPS: 1000.0000 mg | ORAL_CAPSULE | Freq: Every day | ORAL | Status: DC

## 2022-05-16 MED ORDER — MAGNESIUM OXIDE -MG SUPPLEMENT 400 (240 MG) MG PO TABS
400.0000 mg | ORAL_TABLET | Freq: Two times a day (BID) | ORAL | Status: DC
  Administered 2022-05-16: 400 mg via ORAL
  Filled 2022-05-16: qty 1

## 2022-05-16 MED ORDER — CEFADROXIL 500 MG PO CAPS: 1000.0000 mg | ORAL_CAPSULE | Freq: Two times a day (BID) | ORAL | 0 refills | Status: AC

## 2022-05-16 NOTE — Progress Notes (Signed)
[name], [name] ([id number]) [id number] Page 1 of 8 Visit Report for 05/10/2022 Arrival Information Details Patient Name: Date of Service: [name], [name] 05/10/2022 10:30 A M Medical Record Number: [id number] Patient Account Number: [id number] Date of Birth/Sex: Treating RN: 26-Sep-1955 (66 y.o. Charolette Forward, Kim Primary Care Jaelin Devincentis: Ranae Plumber Other Clinician: Massie Kluver Referring Octavius Shin: Treating Violanda Bobeck/Extender: Skeet Simmer in Treatment: 63 Visit Information History Since Last Visit All ordered tests and consults were completed: No Patient Arrived: Gilford Rile Added or deleted any medications: No Arrival Time: 11:17 Any new allergies or adverse reactions: No Transfer Assistance: None Had a fall or experienced change in No Patient Has Alerts: Yes activities of daily living that may affect Patient Alerts: Borderline Diabetic risk of falls: Hospitalized since last visit: No Pain Present Now: No Electronic Signature(s) Signed: 05/15/2022 5:15:48 PM By: Massie Kluver Entered By: Massie Kluver on 05/10/2022 11:22:56 -------------------------------------------------------------------------------- Clinic Level of Care Assessment Details Patient Name: Date of Service: [name], [name] 05/10/2022 10:30 A M Medical Record Number: [id number] Patient Account Number: [id number] Date of Birth/Sex: Treating RN: [birth year] (66 y.o. Marlowe Shores Primary Care Toshiko Kemler: Ranae Plumber Other Clinician: Massie Kluver Referring Brynlei Klausner: Treating Mollye Guinta/Extender: Skeet Simmer in Treatment: 40 Clinic Level of Care Assessment Items TOOL 4 Quantity Score '[]'$  - 0 Use when only an EandM is performed on FOLLOW-UP visit ASSESSMENTS - Nursing Assessment / Reassessment X- 1 10 Reassessment of Co-morbidities (includes updates in patient status) X- 1 5 Reassessment of Adherence to Treatment Plan ASSESSMENTS - Wound and  Skin A ssessment / Reassessment '[]'$  - 0 Simple Wound Assessment / Reassessment - one wound X- 2 5 Complex Wound Assessment / Reassessment - multiple wounds [name], [name] ([id number]) [id number] Page 2 of 8 '[]'$  - 0 Dermatologic / Skin Assessment (not related to wound area) ASSESSMENTS - Focused Assessment '[]'$  - 0 Circumferential Edema Measurements - multi extremities '[]'$  - 0 Nutritional Assessment / Counseling / Intervention '[]'$  - 0 Lower Extremity Assessment (monofilament, tuning fork, pulses) '[]'$  - 0 Peripheral Arterial Disease Assessment (using hand held doppler) ASSESSMENTS - Ostomy and/or Continence Assessment and Care '[]'$  - 0 Incontinence Assessment and Management '[]'$  - 0 Ostomy Care Assessment and Management (repouching, etc.) PROCESS - Coordination of Care X - Simple Patient / Family Education for ongoing care 1 15 '[]'$  - 0 Complex (extensive) Patient / Family Education for ongoing care '[]'$  - 0 Staff obtains Programmer, systems, Records, T Results / Process Orders est '[]'$  - 0 Staff telephones HHA, Nursing Homes / Clarify orders / etc '[]'$  - 0 Routine Transfer to another Facility (non-emergent condition) X- 1 10 Routine Hospital Admission (non-emergent condition) '[]'$  - 0 New Admissions / Biomedical engineer / Ordering NPWT Apligraf, etc. , '[]'$  - 0 Emergency Hospital Admission (emergent condition) X- 1 10 Simple Discharge Coordination '[]'$  - 0 Complex (extensive) Discharge Coordination PROCESS - Special Needs '[]'$  - 0 Pediatric / Minor Patient Management '[]'$  - 0 Isolation Patient Management '[]'$  - 0 Hearing / Language / Visual special needs '[]'$  - 0 Assessment of Community assistance (transportation, D/C planning, etc.) '[]'$  - 0 Additional assistance / Altered mentation '[]'$  - 0 Support Surface(s) Assessment (bed, cushion, seat, etc.) INTERVENTIONS - Wound Cleansing / Measurement '[]'$  - 0 Simple Wound Cleansing - one wound X- 2 5 Complex Wound Cleansing -  multiple wounds X- 1 5 Wound Imaging (photographs - any number of wounds) '[]'$  - 0 Wound Tracing (instead of photographs) '[]'$  - 0 Simple Wound  Measurement - one wound '[]'$  - 0 Complex Wound Measurement - multiple wounds INTERVENTIONS - Wound Dressings '[]'$  - 0 Small Wound Dressing one or multiple wounds X- 2 15 Medium Wound Dressing one or multiple wounds '[]'$  - 0 Large Wound Dressing one or multiple wounds '[]'$  - 0 Application of Medications - topical '[]'$  - 0 Application of Medications - injection INTERVENTIONS - Miscellaneous '[]'$  - 0 External ear exam '[]'$  - 0 Specimen Collection (cultures, biopsies, blood, body fluids, etc.) '[]'$  - 0 Specimen(s) / Culture(s) sent or taken to Lab for analysis [name], [name] ([id number]) [id number] Page 3 of 8 '[]'$  - 0 Patient Transfer (multiple staff / Civil Service fast streamer / Similar devices) '[]'$  - 0 Simple Staple / Suture removal (25 or less) '[]'$  - 0 Complex Staple / Suture removal (26 or more) '[]'$  - 0 Hypo / Hyperglycemic Management (close monitor of Blood Glucose) '[]'$  - 0 Ankle / Brachial Index (ABI) - do not check if billed separately X- 1 5 Vital Signs Has the patient been seen at the hospital within the last three years: Yes Total Score: 110 Level Of Care: New/Established - Level 3 Electronic Signature(s) Signed: 05/15/2022 5:15:48 PM By: Massie Kluver Entered By: Massie Kluver on 05/10/2022 11:49:08 -------------------------------------------------------------------------------- Encounter Discharge Information Details Patient Name: Date of Service: Elizabeth Matte. 05/10/2022 10:30 A M Medical Record Number: [id number] Patient Account Number: [id number] Date of Birth/Sex: Treating RN: 06-22-1956 (65 y.o. Marlowe Shores Primary Care Denean Pavon: Ranae Plumber Other Clinician: Massie Kluver Referring Tobenna Needs: Treating Katherleen Folkes/Extender: Skeet Simmer in Treatment: 67 Encounter Discharge Information  Items Discharge Condition: Stable Ambulatory Status: Walker Discharge Destination: Emergency Room Telephoned: Yes Orders Sent: Yes Transportation: Other Accompanied By: self Schedule Follow-up Appointment: Yes Clinical Summary of Care: Electronic Signature(s) Signed: 05/15/2022 5:15:48 PM By: Massie Kluver Entered By: Massie Kluver on 05/10/2022 12:05:23 -------------------------------------------------------------------------------- Lower Extremity Assessment Details Patient Name: Date of Service: ASIYAH, PINEAU 05/10/2022 10:30 A M Medical Record Number: [id number] Patient Account Number: [id number] Date of Birth/Sex: Treating RN: 1956-02-08 (66 y[address]o. [address] [place], Nubieber C ([id number]) [id number] Page 4 of 8 Primary Care Majid Mccravy: Ranae Plumber Other Clinician: Massie Kluver Referring Rollan Roger: Treating Nash Bolls/Extender: Skeet Simmer in Treatment: 40 Electronic Signature(s) Signed: 05/10/2022 2:35:39 PM By: Gretta Cool BSN, RN, CWS, Kim RN, BSN Signed: 05/15/2022 5:15:48 PM By: Massie Kluver Entered By: Massie Kluver on 05/10/2022 11:34:32 -------------------------------------------------------------------------------- Multi Wound Chart Details Patient Name: Date of Service: [name], [name] 05/10/2022 10:30 A M Medical Record Number: [id number] Patient Account Number: [id number] Date of Birth/Sex: Treating RN: Jun 05, 1956 (66 y.o. Marlowe Shores Primary Care Raymar Joiner: Ranae Plumber Other Clinician: Massie Kluver Referring Kiaria Quinnell: Treating Andrae Claunch/Extender: Skeet Simmer in Treatment: 40 Vital Signs Height(in): 63 Pulse(bpm): 92 Weight(lbs): 372 Blood Pressure(mmHg): 118/73 Body Mass Index(BMI): 65.9 Temperature(F): 98.1 Respiratory Rate(breaths/min): 18 [Treatment Notes:Wound Assessments Treatment Notes] Electronic Signature(s) Signed: 05/15/2022 5:15:48 PM By: Massie Kluver Entered  By: Massie Kluver on 05/10/2022 11:38:22 -------------------------------------------------------------------------------- [place] Details Patient Name: Date of Service: Elizabeth Matte. 05/10/2022 10:30 A M Medical Record Number: [id number] Patient Account Number: [id number] Date of Birth/Sex: Treating RN: [birth year] (66 y.o. Marlowe Shores Primary Care Adleigh Mcmasters: Ranae Plumber Other Clinician: Massie Kluver Referring Lesleigh Hughson: Treating Brydan Downard/Extender: Skeet Simmer in Treatment: [address] MARRIANA, HIBBERD ([id number]) [id number] Page 5 of 8 Wound/Skin Impairment Nursing Diagnoses: Impaired tissue integrity Knowledge deficit related to ulceration/compromised skin integrity Goals: Ulcer/skin breakdown  will have a volume reduction of 30% by week 4 Date Initiated: 08/01/2021 Target Resolution Date: 08/29/2021 Goal Status: Active Ulcer/skin breakdown will have a volume reduction of 50% by week 8 Date Initiated: 08/01/2021 Target Resolution Date: 09/26/2021 Goal Status: Active Ulcer/skin breakdown will have a volume reduction of 80% by week 12 Date Initiated: 08/01/2021 Target Resolution Date: 10/24/2021 Goal Status: Active Ulcer/skin breakdown will heal within 14 weeks Date Initiated: 08/01/2021 Target Resolution Date: 11/07/2021 Goal Status: Active Interventions: Assess patient/caregiver ability to obtain necessary supplies Assess patient/caregiver ability to perform ulcer/skin care regimen upon admission and as needed Assess ulceration(s) every visit Provide education on ulcer and skin care Notes: Electronic Signature(s) Signed: 05/10/2022 2:35:39 PM By: Gretta Cool, BSN, RN, CWS, Kim RN, BSN Signed: 05/15/2022 5:15:48 PM By: Massie Kluver Entered By: Massie Kluver on 05/10/2022 11:38:16 -------------------------------------------------------------------------------- Non-Wound Condition Assessment  Details Patient Name: Date of Service: Elizabeth Matte 05/10/2022 10:30 A M Medical Record Number: [id number] Patient Account Number: [id number] Date of Birth/Sex: Treating RN: 11-13-1955 (66 y.o. Marlowe Shores Primary Care Briteny Fulghum: Ranae Plumber Other Clinician: Massie Kluver Referring Richards Pherigo: Treating Emileo Semel/Extender: Skeet Simmer in Treatment: 40 Non-Wound Condition: Condition: Lymphedema Location: Leg Side: Right Notes: Right leg with decreased drainage this visit. Photos [name], [name] ([id number]) [id number] Page 6 of 8 Notes Patient continues to have clear weaping and drainage from Rt anterior and posterior lower leg Electronic Signature(s) Signed: 05/10/2022 2:35:39 PM By: Gretta Cool, BSN, RN, CWS, Kim RN, BSN Signed: 05/15/2022 5:15:48 PM By: Massie Kluver Entered By: Massie Kluver on 05/10/2022 11:38:01 -------------------------------------------------------------------------------- Non-Wound Condition Assessment Details Patient Name: Date of Service: [name], [name] 05/10/2022 10:30 A M Medical Record Number: [id number] Patient Account Number: [id number] Date of Birth/Sex: Treating RN: January 20, 1956 (66 y.o. Marlowe Shores Primary Care Virgil Slinger: Ranae Plumber Other Clinician: Massie Kluver Referring Vivian Okelley: Treating Ester Mabe/Extender: Skeet Simmer in Treatment: 40 Non-Wound Condition: Condition: Lymphedema Location: Leg Side: Left Notes: decreased drainage this visit. Photos Electronic Signature(s) Signed: 05/10/2022 2:35:39 PM By: Gretta Cool, BSN, RN, CWS, Kim RN, BSN Signed: 05/15/2022 5:15:48 PM By: Massie Kluver Entered By: Massie Kluver on 05/10/2022 11:38:06 -------------------------------------------------------------------------------- Pain Assessment Details Patient Name: Date of Service: [name], [name] 05/10/2022 10:30 A Kathrin Penner ([id number])  [id number] Page 7 of 8 Medical Record Number: [id number] Patient Account Number: [id number] Date of Birth/Sex: Treating RN: 02-19-1956 (66 y.o. Marlowe Shores Primary Care Federico Maiorino: Ranae Plumber Other Clinician: Massie Kluver Referring Stephene Alegria: Treating Archie Shea/Extender: Skeet Simmer in Treatment: 40 Active Problems Location of Pain Severity and Description of Pain Patient Has Paino No Site Locations Pain Management and Medication Current Pain Management: Electronic Signature(s) Signed: 05/10/2022 2:35:39 PM By: Gretta Cool, BSN, RN, CWS, Kim RN, BSN Signed: 05/15/2022 5:15:48 PM By: Massie Kluver Entered By: Massie Kluver on 05/10/2022 11:32:32 -------------------------------------------------------------------------------- Patient/Caregiver Education Details Patient Name: Date of Service: Elizabeth Matte 11/2/2023andnbsp10:30 A M Medical Record Number: [id number] Patient Account Number: [id number] Date of Birth/Gender: Treating RN: [birth year] (66 y.o. Marlowe Shores Primary Care Physician: Ranae Plumber Other Clinician: Massie Kluver Referring Physician: Treating Physician/Extender: Skeet Simmer in Treatment: 40 Education Assessment Education Provided To: Patient Education Topics Provided Infection: Handouts: Other: possible cellulitis, instructed to go to ED for further eval Methods: Explain/Verbal Responses: State content correctly [name], [name] ([id number]) [id number] Page 8 of 8 Electronic Signature(s) Signed: 05/15/2022 5:15:48 PM By: Massie Kluver Entered By: Massie Kluver on 05/10/2022 11:49:53 -------------------------------------------------------------------------------- Vitals  Details Patient Name: Date of Service: [name], [name] 05/10/2022 10:30 A M Medical Record Number: [id number] Patient Account Number: [id number] Date of Birth/Sex: Treating  RN: Mar 16, 1956 (66 y.o. Charolette Forward, Kim Primary Care Martrice Apt: Ranae Plumber Other Clinician: Massie Kluver Referring Bennetta Rudden: Treating Jaxden Blyden/Extender: Skeet Simmer in Treatment: 40 Vital Signs Time Taken: 11:23 Temperature (F): 98.1 Height (in): 63 Pulse (bpm): 92 Weight (lbs): 372 Respiratory Rate (breaths/min): 18 Body Mass Index (BMI): 65.9 Blood Pressure (mmHg): 118/73 Reference Range: 80 - 120 mg / dl Electronic Signature(s) Signed: 05/15/2022 5:15:48 PM By: Massie Kluver Entered By: Massie Kluver on 05/10/2022 11:25:12

## 2022-05-16 NOTE — Discharge Summary (Addendum)
Physician Discharge Summary  Pricila Bridge URK:[id number] DOB: [birth year] DOA: 05/10/2022  PCP: Ranae Plumber, PA  Admit date: 05/10/2022 Discharge date: 05/16/2022 Recommendations for Outpatient Follow-up:  Follow up with PCP in 1 weeks-call for appointment Continue wound care with wound care clinic Continue wound care at home Please obtain BMP/CBC in one week  Discharge Dispo: home /w [facility] Discharge Condition: Stable Code Status:   Code Status: Full Code Diet recommendation:  Diet Order             Diet - low sodium heart healthy           Diet Heart Room service appropriate? Yes; Fluid consistency: Thin  Diet effective now                   Brief/Interim Summary: 66 year old female with past medical history of hereditary lymphedema, stage IIIb chronic kidney disease, obstructive sleep apnea and severe morbid obesity who is discharged from the hospital a little over a month ago for sim lower extremity cellulitis and discharged on p.o. antibiotics continue to have issues with persistent erythema, bullous lesions and drainage even after completing an extended antibiotic course, was sent over to the emergency room on 11/2 from her wound care clinic for worsening edema and drainage.  Patient evaluated and found to have cellulitis of the right lower extremity and admitted to the hospitalist service.  Lab work noteworthy for acute kidney injury with a creatinine of 1.66  (baseline around 1.4).  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   Following admission on afternoon of 11/2, patient had an event where she suddenly developed abdominal pain, became tachycardic, spiked blood pressure and then vomited and blood pressure became soft.  Repeat lab work noted jump of lactic acid from 1.7 up to 5.5. Patient started on aggressive fluid resuscitation and Rocephin changed to cefepime.  Follow-up blood work  this morning noted white blood cell count of 18.7 (was 4.8 on admission) and procalcitonin level of 30.  During course of day of 11/3, patient's IV access was lost and unable to be replaced.  After discussion with critical care and vascular surgery, patient taken down to Cath Lab and vascular surgery placed central line.  IV antibiotics  resumed.  Also started on Zyvox as patient has allergy to vancomycin. 11/8:Overnight afebrile on room air Labs stable renal function mag low 1.6 no CBC today, previously anemic with hemoglobin 9.2 She feels she has improved, she has normal drainage from her lower extremities pain improved.  She feels stable for discharge home with home health services and has a wound care follow-up coming next week.  She is hesitant to go home on Keflex.     Discharge Diagnoses:  Principal Problem:   Recurrent cellulitis of lower extremity Active Problems:   Septic shock (HCC)   AKI (acute kidney injury) ([place]) in the setting of stage IIIb chronic kidney disease   Chronic diastolic CHF (congestive heart failure) (HCC)   Hypokalemia   Lymphedema   OSA on CPAP   Neuropathy   Morbid obesity with BMI of 60.0-69.9, adult ([place])   Septic shock -resolved Recurrent cellulitis of lower extremity: CT scan 11/7 did not show any drainable fluid collection.  Cellulitis has significantly improved she does have chronic lymphedema at this time wound no longer draining pain is controlled improved.  She feels stable for discharge home and she will continue wound care at home  with home health and also has appointment to go to wound care clinic next week.  Reluctant to take Keflex will discharge on Duricef  AKI on CKD stage IIIb: Improved Recent Labs  Lab 05/12/22 0349 05/13/22 0642 05/14/22 0530 05/15/22 0648 05/16/22 0654  BUN 42* 28* '23 19 19  '$ CREATININE 1.68* 1.19* 1.24* 1.14* 1.02*    Hypokalemia-resolved Hypomagnesemia: Continue oral Clement Chronic diastolic CHF Chronic  lymphedema: Stable continue home regimen OSA on CPAP Morbid obesity with BMI of 60.0-69.9, adult.  Will benefit with weight loss Normocytic anemia chronic, stable-advised outpatient follow-up  Consults: Vascular surgery Subjective: Alert awake oriented resting comfortably no draining from the wound, she feels ready for discharge today  Discharge Exam: Vitals:   05/16/22 0745 05/16/22 1148  BP: 120/75 126/68  Pulse: 65 62  Resp: 18 18  Temp: 98 F (36.7 C) 98.5 F (36.9 C)  SpO2: 95% 97%   General: Pt is alert, awake, not in acute distress Cardiovascular: RRR, S1/S2 +, no rubs, no gallops Respiratory: CTA bilaterally, no wheezing, no rhonchi Abdominal: Soft, NT, ND, bowel sounds + Extremities: Chronically edema, dry le  Discharge Instructions  Discharge Instructions     Diet - low sodium heart healthy   Complete by: As directed    Discharge wound care:   Complete by: As directed    Change dressings to bilat legs Q day as follows:  Tuck Calcium alginate into posterior skin folds and cover and sides of calves and posterior legs with ABD pads, then wrap with kerlex and ace wrap   Increase activity slowly   Complete by: As directed       Allergies as of 05/16/2022       Reactions   Vancomycin Other (See Comments)   Red man syndrome   Ace Inhibitors Hives, Swelling   Ibuprofen Hives, Swelling        Medication List     TAKE these medications    acetaminophen 500 MG tablet Commonly known as: TYLENOL Take 500-1,000 mg by mouth every 6 (six) hours as needed for mild pain or fever.   albuterol 108 (90 Base) MCG/ACT inhaler Commonly known as: VENTOLIN HFA Inhale into the lungs.   bumetanide 2 MG tablet Commonly known as: Bumex Take 1 tablet (2 mg total) by mouth daily.   cefadroxil 500 MG capsule Commonly known as: DURICEF Take 1 capsule (500 mg total) by mouth 2 (two) times daily for 10 days.   cephALEXin 500 MG capsule Commonly known as: KEFLEX Take 2  capsules (1,000 mg total) by mouth daily. Hold these prophylactic antibiotics while you are taking the other antibiotics from hospital What changed: additional instructions   cetirizine 10 MG tablet Commonly known as: ZYRTEC Take 1 tablet (10 mg total) by mouth daily.   cyanocobalamin 1000 MCG tablet Take 1,000 mcg by mouth every other day. Notes to patient: Tomorrow 11/9   magnesium oxide 400 (240 Mg) MG tablet Commonly known as: MAG-OX Take 1 tablet (400 mg total) by mouth 2 (two) times daily for 3 days. Notes to patient: Tonight 11/8   metolazone 5 MG tablet Commonly known as: ZAROXOLYN Take 0.5 tablets (2.5 mg total) by mouth 2 (two) times a week. What changed: additional instructions   midodrine 10 MG tablet Commonly known as: PROAMATINE Take 1 tablet (10 mg total) by mouth 3 (three) times daily with meals. Notes to patient: Tonight with dinner 11/8   nystatin powder Generic drug: nystatin Apply topically.   potassium chloride  SA 20 MEQ tablet Commonly known as: KLOR-CON M Take 2 tablets (40 mEq total) by mouth daily.   pregabalin 100 MG capsule Commonly known as: LYRICA Take 100 mg by mouth daily.   senna-docusate 8.6-50 MG tablet Commonly known as: Senokot-S Take 1 tablet by mouth 2 (two) times daily as needed for mild constipation or moderate constipation.               Durable Medical Equipment  (From admission, onward)           Start     Ordered   05/16/22 1054  For home use only DME standard manual wheelchair with seat cushion  Once       Comments: Patient suffers from CHF which impairs their ability to perform daily activities like toileting in the home.  A cane will not resolve issue with performing activities of daily living. A wheelchair will allow patient to safely perform daily activities. Patient can safely propel the wheelchair in the home or has a caregiver who can provide assistance. Length of need Lifetime. Accessories: elevating leg  rests (ELRs), wheel locks, extensions and anti-tippers.  Heavy Duty   05/16/22 1055   05/13/22 1401  For home use only DME high strength lightweight manual wheelchair with seat cushion  Once       Comments: Patient suffers from Sepsis ,cellultis which impairs their ability to perform daily activities like bathing and toileting in the home.  A walker will not resolve  issue with performing activities of daily living. A wheelchair will allow patient to safely perform daily activities.Length of need 6 months . (THEN ONE OF THESE TWO:) Patient self-propels the wheelchair while engaging in frequent activities such as laundry and toileting which cannot be performed in a standard or lightweight wheelchair due to the weight of the chair. Accessories: elevating leg rests (ELRs), wheel locks, extensions and anti-tippers.   05/13/22 1400              Discharge Care Instructions  (From admission, onward)           Start     Ordered   05/16/22 0000  Discharge wound care:       Comments: Change dressings to bilat legs Q day as follows:  Tuck Calcium alginate into posterior skin folds and cover and sides of calves and posterior legs with ABD pads, then wrap with kerlex and ace wrap   05/16/22 1054            Follow-up Information     Ranae Plumber, PA. Go on 05/23/2022.   Specialty: Family Medicine Why: 3:20pm appointment Contact information: [place] [address] [phone]                Allergies  Allergen Reactions   Vancomycin Other (See Comments)    Red man syndrome   Ace Inhibitors Hives and Swelling   Ibuprofen Hives and Swelling    The results of significant diagnostics from this hospitalization (including imaging, microbiology, ancillary and laboratory) are listed below for reference.    Microbiology: No results found for this or any previous visit (from the past 240 hour(s)).  Procedures/Studies: CT TIBIA FIBULA RIGHT W  CONTRAST  Result Date: 05/15/2022 CLINICAL DATA:  Right lower extremity pain and swelling. EXAM: CT OF THE LOWER RIGHT EXTREMITY WITH CONTRAST TECHNIQUE: Multidetector CT imaging of the lower right extremity was performed according to the standard protocol following intravenous contrast administration. RADIATION DOSE REDUCTION: This exam  was performed according to the departmental dose-optimization program which includes automated exposure control, adjustment of the mA and/or kV according to patient size and/or use of iterative reconstruction technique. CONTRAST:  113m OMNIPAQUE IOHEXOL 300 MG/ML  SOLN COMPARISON:  CT scan of 12/24/2020 FINDINGS: Diffuse and marked subcutaneous soft tissue swelling/edema/fluid and marked skin thickening below the knee all suggesting severe cellulitis. No discrete fluid collection to suggest a drainable soft tissue abscess. No definite CT findings for myofasciitis or pyomyositis. Advanced degenerative changes are noted at the knee joint. No findings suspicious for osteomyelitis involving the tibia or fibula. The ankle joint is maintained. IMPRESSION: 1. Severe diffuse cellulitis. No discrete fluid collection to suggest a drainable soft tissue abscess. 2. No definite CT findings for myofasciitis or pyomyositis. 3. No findings suspicious for osteomyelitis involving the tibia or fibula. 4. Advanced degenerative changes at the knee joint. Electronically Signed   By: PMarijo SanesM.D.   On: 05/15/2022 13:05   DG Tibia/Fibula Right  Result Date: 05/15/2022 CLINICAL DATA:  Cellulitis and abscess of leg EXAM: RIGHT TIBIA AND FIBULA - 2 VIEW COMPARISON:  None Available. FINDINGS: No evidence of acute fracture or joint malalignment. No erosive changes to suggest osteomyelitis. Degenerative changes at the knee. Extensive edema. Calcific atherosclerosis. IMPRESSION: Extensive edema without specific evidence of osteomyelitis. Cross-sectional imaging could provide more sensitive evaluation  if clinically warranted. Electronically Signed   By: FMargaretha SheffieldM.D.   On: 05/15/2022 10:16   DG Chest Port 1 View  Result Date: 05/11/2022 CLINICAL DATA:  Central line placement EXAM: PORTABLE CHEST 1 VIEW COMPARISON:  10/31/2021 x-ray, 05/10/2022 CT FINDINGS: Right IJ approach central venous catheter terminating near the superior cavoatrial junction. No pneumothorax. Heart size within normal limits. Increased interstitial markings bilaterally. No pleural effusion. IMPRESSION: 1. Right IJ approach central venous catheter. No pneumothorax. 2. Increased interstitial markings bilaterally, may reflect mild edema. Electronically Signed   By: NDavina PokeD.O.   On: 05/11/2022 18:13   CARDIAC CATHETERIZATION  Result Date: 05/11/2022 See surgical note for result.  CT CHEST ABDOMEN PELVIS W CONTRAST  Result Date: 05/10/2022 CLINICAL DATA:  Sepsis.  Aortic aneurysm. EXAM: CT CHEST, ABDOMEN, AND PELVIS WITH CONTRAST TECHNIQUE: Multidetector CT imaging of the chest, abdomen and pelvis was performed following the standard protocol during bolus administration of intravenous contrast. RADIATION DOSE REDUCTION: This exam was performed according to the departmental dose-optimization program which includes automated exposure control, adjustment of the mA and/or kV according to patient size and/or use of iterative reconstruction technique. CONTRAST:  1049mOMNIPAQUE IOHEXOL 300 MG/ML  SOLN COMPARISON:  CT of the chest abdomen pelvis dated 03/25/2019. FINDINGS: CT CHEST FINDINGS Cardiovascular: There is no cardiomegaly or pericardial effusion. There is coronary vascular calcification. The thoracic aorta is unremarkable. The origins of the great vessels of the aortic arch and the central pulmonary arteries appear patent. Mediastinum/Nodes: No hilar or mediastinal adenopathy. The esophagus and thyroid gland are grossly unremarkable. No mediastinal fluid collection. Lungs/Pleura: No focal consolidation, pleural  effusion, or pneumothorax. The central airways are patent. Musculoskeletal: No acute osseous pathology. CT ABDOMEN PELVIS FINDINGS No intra-abdominal free air or free fluid. Hepatobiliary: Mild fatty liver. No biliary dilatation. The gallbladder is unremarkable. Pancreas: Unremarkable. No pancreatic ductal dilatation or surrounding inflammatory changes. Spleen: Normal in size without focal abnormality. Adrenals/Urinary Tract: The adrenal glands unremarkable. Subcentimeter left renal upper pole hypodense focus is too small characterize. There is no hydronephrosis on either side. The visualized ureters and urinary bladder appear unremarkable. Stomach/Bowel:  There is no bowel obstruction or active inflammation. The appendix is normal. Vascular/Lymphatic: The abdominal aorta and IVC unremarkable. No portal venous gas. There is no adenopathy. Reproductive: The uterus is anteverted and grossly unremarkable. No adnexal masses. Other: None Musculoskeletal: Lower lumbar facet arthropathy. No acute osseous pathology. IMPRESSION: 1. No acute intrathoracic, abdominal, or pelvic pathology. 2. Mild fatty liver. Electronically Signed   By: Anner Crete M.D.   On: 05/10/2022 20:37   CT HEAD WO CONTRAST (5MM)  Result Date: 05/10/2022 CLINICAL DATA:  Altered mental status EXAM: CT HEAD WITHOUT CONTRAST TECHNIQUE: Contiguous axial images were obtained from the base of the skull through the vertex without intravenous contrast. RADIATION DOSE REDUCTION: This exam was performed according to the departmental dose-optimization program which includes automated exposure control, adjustment of the mA and/or kV according to patient size and/or use of iterative reconstruction technique. COMPARISON:  None Available. FINDINGS: Brain: No acute intracranial findings are seen. There are no signs of bleeding within the cranium. Ventricles are not dilated. Cortical sulci are prominent. Vascular: Unremarkable. Skull: Unremarkable.  Sinuses/Orbits: Small osteoma is seen in frontal sinus. Other: None. IMPRESSION: No acute intracranial findings are seen in noncontrast CT brain. Electronically Signed   By: Elmer Picker M.D.   On: 05/10/2022 20:35   US RENAL  Result Date: 04/24/2022 CLINICAL DATA:  Stage III renal disease EXAM: RENAL / URINARY TRACT ULTRASOUND COMPLETE COMPARISON:  None Available. FINDINGS: Right Kidney: Renal measurements: 10.7 x 4.2 x 4.3 cm = volume: 100.3 mL. Echogenicity within normal limits. No mass or hydronephrosis visualized. Left Kidney: Renal measurements: 9.8 x 5.4 x 5.1 cm = volume: 140 mL. Echogenicity within normal limits. No mass or hydronephrosis visualized. Bladder: Appears normal for degree of bladder distention. Other: None. IMPRESSION: Normal study. No cause for chronic renal failure identified. Electronically Signed   By: Dorise Bullion III M.D.   On: 04/24/2022 12:33    Labs: BNP (last 3 results) Recent Labs    10/03/21 1301 10/31/21 1247  BNP 39.4 371.6*   Basic Metabolic Panel: Recent Labs  Lab 05/11/22 0505 05/12/22 0349 05/13/22 0642 05/14/22 0530 05/15/22 0648 05/16/22 0654  NA 136 137 139 138 140 142  K 3.3* 2.9* 3.2* 3.9 4.2 4.3  CL 100 101 104 104 106 107  CO2 '26 28 28 29 29 30  '$ GLUCOSE 123* 97 93 85 91 91  BUN 44* 42* 28* '23 19 19  '$ CREATININE 2.01* 1.68* 1.19* 1.24* 1.14* 1.02*  CALCIUM 7.5* 7.8* 8.1* 8.2* 8.6* 8.6*  MG 1.6* 1.7  --  2.0 1.8 1.6*  PHOS  --   --   --  2.4* 2.5  --    Liver Function Tests: Recent Labs  Lab 05/10/22 1340 05/11/22 0505  AST 16 23  ALT 9 11  ALKPHOS 93 67  BILITOT 0.7 0.9  PROT 8.2* 6.3*  ALBUMIN 3.2* 2.3*   No results for input(s): "LIPASE", "AMYLASE" in the last 168 hours. No results for input(s): "AMMONIA" in the last 168 hours. CBC: Recent Labs  Lab 05/10/22 1340 05/11/22 0505 05/12/22 0349 05/13/22 0642 05/14/22 0530 05/16/22 0654  WBC 4.8 18.7* 12.8* 4.7 4.6 4.4  NEUTROABS 2.6  --   --   --   --   --    HGB 10.0* 9.7* 8.5* 9.1* 9.0* 9.1*  HCT 31.8* 31.2* 27.5* 29.1* 29.0* 29.7*  MCV 82.4 83.0 83.3 83.1 83.1 84.6  PLT 162 161 135* 137* 159 155   Cardiac Enzymes: No  results for input(s): "CKTOTAL", "CKMB", "CKMBINDEX", "TROPONINI" in the last 168 hours. BNP: Invalid input(s): "POCBNP" CBG: Recent Labs  Lab 05/10/22 1859  GLUCAP 99   D-Dimer No results for input(s): "DDIMER" in the last 72 hours. Hgb A1c No results for input(s): "HGBA1C" in the last 72 hours. Lipid Profile No results for input(s): "CHOL", "HDL", "LDLCALC", "TRIG", "CHOLHDL", "LDLDIRECT" in the last 72 hours. Thyroid function studies No results for input(s): "TSH", "T4TOTAL", "T3FREE", "THYROIDAB" in the last 72 hours.  Invalid input(s): "FREET3" Anemia work up No results for input(s): "VITAMINB12", "FOLATE", "FERRITIN", "TIBC", "IRON", "RETICCTPCT" in the last 72 hours. Urinalysis    Component Value Date/Time   COLORURINE YELLOW (A) 05/11/2022 1658   APPEARANCEUR CLEAR (A) 05/11/2022 1658   APPEARANCEUR Clear 05/18/2013 1725   LABSPEC 1.018 05/11/2022 1658   LABSPEC 1.011 05/18/2013 1725   PHURINE 5.0 05/11/2022 1658   GLUCOSEU NEGATIVE 05/11/2022 1658   GLUCOSEU Negative 05/18/2013 1725   HGBUR NEGATIVE 05/11/2022 1658   BILIRUBINUR NEGATIVE 05/11/2022 1658   BILIRUBINUR Negative 05/18/2013 [place] 05/11/2022 1658   PROTEINUR NEGATIVE 05/11/2022 1658   NITRITE NEGATIVE 05/11/2022 1658   LEUKOCYTESUR NEGATIVE 05/11/2022 1658   LEUKOCYTESUR Negative 05/18/2013 1725   Sepsis Labs Recent Labs  Lab 05/12/22 0349 05/13/22 0642 05/14/22 0530 05/16/22 0654  WBC 12.8* 4.7 4.6 4.4   Microbiology No results found for this or any previous visit (from the past 240 hour(s)).   Time coordinating discharge: 35 minutes  SIGNED: Antonieta Pert, MD  [facility] 05/16/2022, 11:50 AM  If 7PM-7AM, please contact night-coverage [email or web address]

## 2022-05-16 NOTE — TOC Transition Note (Addendum)
Transition of Care [facility]) - CM/SW Discharge Note   Patient Details  Name: [name] MRN: [id number] Date of Birth: 14-Mar-1956  Transition of Care [facility]) CM/SW Contact:  Beverly Sessions, RN Phone Number: 05/16/2022, 11:20 AM   Clinical Narrative:     Patient to discharge home today [facility] notified of discharge EMS packet on chart Patient confirms she has a key to the home.  Patient states that her neighbor will be available to come over if needed  Bedside RN to let me know when patient is ready for EMS to be called  Update:  Bedside RN to call EMS transport when patient is ready   Barriers to Discharge: Continued Medical Work up   Patient Goals and CMS Choice Patient states their goals for this hospitalization and ongoing recovery are:: home with home health CMS Medicare.gov Compare Post Acute Care list provided to:: Patient Choice offered to / list presented to : Patient  Discharge Placement                       Discharge Plan and Services                                     Social Determinants of Health (SDOH) Interventions     Readmission Risk Interventions    05/11/2022    9:56 AM 01/20/2021    9:48 AM  Readmission Risk Prevention Plan  Transportation Screening Complete Complete  PCP or Specialist Appt within 5-7 Days  Complete  PCP or Specialist Appt within 3-5 Days Complete   Home Care Screening  Complete  Medication Review (RN CM)  Referral to Pharmacy  HRI or [place] Complete   Social Work Consult for [place] Planning/Counseling Complete   Palliative Care Screening Not Applicable   Medication Review Press photographer) Complete

## 2022-05-16 NOTE — Progress Notes (Signed)
    Durable Medical Equipment  (From admission, onward)           Start     Ordered   05/16/22 1054  For home use only DME standard manual wheelchair with seat cushion  Once       Comments: Patient suffers from CHF which impairs their ability to perform daily activities like toileting in the home.  A cane will not resolve issue with performing activities of daily living. A wheelchair will allow patient to safely perform daily activities. Patient can safely propel the wheelchair in the home or has a caregiver who can provide assistance. Length of need Lifetime. Accessories: elevating leg rests (ELRs), wheel locks, extensions and anti-tippers.  Heavy Duty   05/16/22 1055   05/13/22 1401  For home use only DME high strength lightweight manual wheelchair with seat cushion  Once       Comments: Patient suffers from Sepsis ,cellultis which impairs their ability to perform daily activities like bathing and toileting in the home.  A walker will not resolve  issue with performing activities of daily living. A wheelchair will allow patient to safely perform daily activities.Length of need 6 months . (THEN ONE OF THESE TWO:) Patient self-propels the wheelchair while engaging in frequent activities such as laundry and toileting which cannot be performed in a standard or lightweight wheelchair due to the weight of the chair. Accessories: elevating leg rests (ELRs), wheel locks, extensions and anti-tippers.   05/13/22 1400

## 2022-05-16 NOTE — TOC Progression Note (Signed)
Transition of Care [facility]) - Progression Note    Patient Details  Name: [name] MRN: [id number] Date of Birth: 06-25-1956  [facility] CM/SW Contact  Beverly Sessions, RN Phone Number: 05/16/2022, 9:31 AM  Clinical Narrative:    Confirmed with patient that [facility] was not delivered to the room [facility] will need to be delivered to the home as she will require EMS transport at discharge.  Faythe Dingwall with [employer] notified    Expected Discharge Plan: [place] Barriers to Discharge: Continued Medical Work up  Expected Discharge Plan and Services Expected Discharge Plan: [place] arrangements for the past 2 months: Single Family Home                                       Social Determinants of Health (SDOH) Interventions    Readmission Risk Interventions    05/11/2022    9:56 AM 01/20/2021    9:48 AM  Readmission Risk Prevention Plan  Transportation Screening Complete Complete  PCP or Specialist Appt within 5-7 Days  Complete  PCP or Specialist Appt within 3-5 Days Complete   Home Care Screening  Complete  Medication Review (RN CM)  Referral to Pharmacy  HRI or [place] Complete   Social Work Consult for [place] Planning/Counseling Complete   Palliative Care Screening Not Applicable   Medication Review Press photographer) Complete

## 2022-05-17 ENCOUNTER — Encounter: Payer: Medicare HMO | Admitting: Internal Medicine

## 2022-05-24 ENCOUNTER — Encounter: Payer: Medicare HMO | Admitting: Physician Assistant

## 2022-05-24 DIAGNOSIS — Q820 Hereditary lymphedema: Secondary | ICD-10-CM | POA: Diagnosis not present

## 2022-05-24 DIAGNOSIS — L97811 Non-pressure chronic ulcer of other part of right lower leg limited to breakdown of skin: Secondary | ICD-10-CM | POA: Diagnosis not present

## 2022-05-24 DIAGNOSIS — L97821 Non-pressure chronic ulcer of other part of left lower leg limited to breakdown of skin: Secondary | ICD-10-CM | POA: Diagnosis not present

## 2022-05-24 NOTE — Progress Notes (Addendum)
[name] ([id number]) [id number] Page 1 of 14 Visit Report for 05/24/2022 Chief Complaint Document Details Patient Name: Date of Service: [name], [name] 05/24/2022 10:30 A M Medical Record Number: [id number] Patient Account Number: [id number] Date of Birth/Sex: Treating RN: Aug 08, 1955 (66 y.o. [name] Primary Care Provider: Ranae Plumber Other Clinician: Massie Kluver Referring Provider: Treating Provider/Extender: Skeet Simmer in Treatment: 42 Information Obtained from: Patient Chief Complaint Bilateral LE lymphedema with Left LE Ulcers Electronic Signature(s) Signed: 05/24/2022 10:37:46 AM By: Worthy Keeler PA-C Entered By: Worthy Keeler on 05/24/2022 10:37:46 -------------------------------------------------------------------------------- HPI Details Patient Name: Date of Service: [name] 05/24/2022 10:30 A M Medical Record Number: [id number] Patient Account Number: [id number] Date of Birth/Sex: Treating RN: 03-08-1956 (66 y.o. [name] Primary Care Provider: Ranae Plumber Other Clinician: Massie Kluver Referring Provider: Treating Provider/Extender: Skeet Simmer in Treatment: 42 History of Present Illness HPI Description: The patient is a 66 year old female with history of hypertension and a long-standing history of bilateral lower extremity lymphedema (first presented on 4/2) . She has had open ulcers in the past which have always responded to compression therapy. She had briefly been to a lymphedema clinic in the past which helped her at the time. this time around she stopped treatment of her lymphedema pumps approximately 2 weeks ago because of some pain in the knees and then noticed the right leg getting worse. She was seen by her PCP who put her on clindamycin 4 times a day 2 days ago. The patient has seen [facility] and Dr. Delana Meyer had seen her last year where a  vascular study including venous and arterial duplex studies were within normal limits. he had recommended compression stockings and lymphedema pumps and the patient has been using this in about 2 weeks ago. She is known to be diabetic but in the past few time she's gone to her primary care doctor her hemoglobin A1c has been normal. 02/11/2015 - after her last visit she took my advice and went to the ER regarding the progressive cellulitis of her right lower extremity and she was admitted between July 17 and 22nd. She received IV antibiotics and then was sent home on a course of steroid-induced and oral antibiotics. She has improved much since then. 02/17/2015 -- she has been doing fine and the weeping of her legs has remarkably gone down. She has no fresh issues. [name], [name] ([id number]) [id number] Page 2 of 14 01/15/18 This patient was given this clinic before most recently in 2016 seen by Dr. Con Memos. She has massive bilateral lymphedema and over the last 2 months this had weeping edema out of the left leg. She has compression pumps but her compliance with these has been minimal. She has [facility] they've been using TCA/ABDs/kerlix under an Ace wrap.she has had recent problems with cellulitis. She was apparently seen in the ER and 12/23/17 and given clindamycin. She was then followed by her primary doctor and given doxycycline and Keflex. The pain seems to have settled down. In April 2018 the patient had arterial studies done at [facility]. This showed triphasic waveforms throughout the right leg and mostly triphasic waveforms on the left except for monophasic at the posterior tibial artery distally. She was not felt to have evidence of right lower extremity arterial stenosis or significant problems on the left side. 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 She also had a right  lower extremity venous  Doppler in January 2018 this was limited by the patient's body habitus and lymphedema. Most of the proximal veins were not visualized The patient presents with an area of denuded skin on the anterior medial part of the left calf. There is weeping edema fluid here. 01/22/18; the patient has somewhat better edema control using her compression pumps twice a day and as a result she has much better epithelialization on the left anterior calf area. Only a small open area remains. 01/29/18; the patient has been compliant with her compression pumps. Both the areas on her calf that healed. The remaining area on the left anterior leg is fully epithelialized Readmission: 02/20/2019 upon evaluation today patient presents for reevaluation due to issues that she is having with the bilateral lower extremities. She actually has wounds open on both legs. On the right she has an area in the crease of her leg on the right around the knee region which is actually draining quite a bit and actually has some fungal type appearance to it. She has been on nystatin powder that seems to have helped to some degree. In regard to the left lower extremity this is actually in the lower portion of her leg closer to the ankle and again is continuing to drain as well unfortunately. There does not appear to be any signs of active infection at this time which is good news. No fevers, chills, nausea, vomiting, or diarrhea. She tells me that since she was seen last year she is actually been doing quite well for the most part with regard to her lower extremities. Unfortunately she now is experiencing a little bit more drainage at this time. She is concerned about getting this under control so that it does not get significantly worse. 02/27/2019 on evaluation today patient appears to be doing somewhat better in regard to her bilateral lower extremity wounds. She has been tolerating the dressing changes without complication. Fortunately there is  no signs of active infection at this point. No fevers, chills, nausea, vomiting, or diarrhea. She did get her dressing supplies which is excellent news she was extremely excited to get these. She also got paperwork from [employer] for their financial assistance program where they may be able to help her out in the future if needed with supplies at discounted prices. 03/06/2019 on evaluation today patient appears to be doing a little worse with regard to both areas of weeping on her bilateral lower extremities. This is around the right medial knee and just above the left ankle. With that being said she is unfortunately not doing as well as I would like to see. I feel like she may need to potentially go see someone at the lymphedema clinic as the wraps that she needs or even beyond what we can do here at the wound care center. She really does not have wounds she just has open areas of weeping that are causing some difficulty for her. Subsequently because of this and the moisture I am concerned about the potential for infection I am going to likely give her a prophylactic antibiotic today, Keflex, just to be on the safe side. Nonetheless again there is no obvious signs of active infection at this time. 03/13/2019 on evaluation today patient appears to be doing well with regard to her bilateral lower extremities where she has been weeping compared to even last week's evaluation. I see some areas of new skin growth which is excellent and overall I am very pleased with  how things seem to be progressing. No fevers, chills, nausea, vomiting, or diarrhea. 03/20/2019 on evaluation today patient unfortunately is continuing to have issues with significant edema of the left lower extremity. Her right side seems to be doing much better. Unfortunately her left side is showing increased weeping of the lower portion of her leg. This is quite unfortunate obviously we were hoping to get her into the lymphedema clinic they really  do not seem to when I see her how if she is draining. Despite the fact this is really not wound related but more lymphedema weeping related. Nonetheless I do not know that this can be helpful for her to even go for that appointment since again I am not sure there is much that they would actually do at this point. We may need to try a 4 layer compression wrap as best we can on her leg. She is on the Augmentin currently although I am still concerned about whether or not there could be potentially something going on infection wise I would obtain a culture though I understand is not the best being that is a surface culture I just 1 to make sure I do not seem to be missing anything. 03/27/2019 on evaluation today patient appears to be doing much better in regard to the left lower extremity compared to last week. Last week she had tremendous weeping which I think was subsequent to infection now she seems to be doing much better and very pleased. This is not completely healed but there is a lot of new skin growth and it has dried out quite a bit. Overall I think that we are doing well with how things are moving along at this time. No fevers, chills, nausea, vomiting, or diarrhea. 04/03/2019 evaluation today patient appears to be doing a little worse this week compared to last time I saw her. I think this may be due to the fact that she is having issues with not being able to sleep in her bed at least not until last night. She is therefore been in a lift chair and subsequently has also had issues with not been able to use her pumps since she could not get in bed. With that being said the patient overall seems to be doing okay I do think I may want extend the antibiotic for a little bit longer at least until we can see if her edema and her weeping gets better and if it is then obviously I can always discontinue the antibiotics as of next week however I want her to continue to have it over the next week. 04/10/2019  on evaluation today patient unfortunately is still doing poorly with regard to her left lower extremity. Her right is all things considering doing fairly well. On the left however she continues to have spreading of the area of infection and weeping which appears to be even a larger surface area than noted last week. She did have a positive culture for Pseudomonas in particular which seems to have been of concern she still has green/yellow discharge consistent with Pseudomonas and subsequently a tremendous amount of it. This has me obviously still concerned about the infection not really clearing up despite the fact that on culture it appears the Cipro should have been a good option for treating this. I think she may at this point need IV antibiotics since things are not doing better I do not want to get worse and cause sepsis. She is in agreement with the  plan and believes as well that she likely does need to go to the hospital for IV vancomycin. Or something of the like depending on what the recommendation is from the ER. 04/17/2019 on evaluation today patient appears to be doing excellent in regard to her lower extremity on the left. She was in the hospital for several days from when I sent her last we saw her until just this past Tuesday. Fortunately her drainage is significantly improved and in fact is mostly clear. There is just a couple small areas that may still drain a little bit she states that the [facility] they prescribed for her at discharge she went picked up from pharmacy and got home but has not been able to find it since. She is looked everywhere. She is wondering if I will replace that for her today I will be more than happy to do that. 05/01/2019 on evaluation today patient actually appears to be doing quite well with regard to her lower extremities. She occasionally is having areas that will leak and then heal up mainly when a piece of the fibrotic skin pops off but fortunately she is not  having any signs of active infection at this time. Overall she also really does not have any obvious weeping at this time. I do believe however she really needs some compression wraps and I think this may be a good time to get her back to the lymphedema clinic. 05/11/2019 on evaluation today patient actually appears to be doing quite well with regard to her bilateral lower extremities. She occasionally will have a small area that we per another but in general seems to be completely healed which is great news. Overall very pleased with how everything seems to be progressing. She does have her appointment with lymphedema clinic on November 18. 05/25/2019 on evaluation today patient appears to be doing well with regard to her left lower extremity. I am very pleased in this regard. In regard to her right leg this actually did start draining more I think it is mainly due to the fact that her leg is more swollen. I am not seeing any obvious signs of infection at this time although that is definitely something were obviously acutely aware of simply due to the fact that she had an issue not too far back with exactly this issue. Nonetheless I do feel like that lymphedema clinic would still be beneficial for her. I explained obviously if they are not able to do anything treatment wise on the right leg we could at least have them treat her left leg and then proceed from there. The patient is really in agreement with that plan. If they are able to do both as the drainage slows down that I would be happy to let them handle both. [name], [name] ([id number]) [id number] Page 3 of 14 06/01/2019 on evaluation today patient unfortunately appears to be doing worse with regard to her right lower extremity. The left lower extremity is still maintaining at this point. Unfortunately she has been having significantly increased pain over the past several days and has been experiencing as  well increased swelling of the right lower extremity. I really do not know that I am seeing anything that appears to be obvious for infection at this point to be peripherally honest. With that being said the patient does seem to be having much more swelling that she is even experienced in the past and coupled with increased pain in her hip as well I am  concerned that again she could potentially have a DVT although I am not 100% sure of this. I think it something that may need to be checked out. We discussed the possibility of sending her for a DVT study through the hospital but unfortunately transportation is an issue if she does have a DVT I do not want her to wait days to be able to get in for that test however if she has this scheduled as an outpatient that is as fast that she will be able to get the test scheduled for transportation purposes. That will also fall on Thanksgiving so subsequently she did actually be looking at either Friday or even next week before we would know anything back from this. That is much too long in my opinion. Subsequent to the amount of discomfort she is experiencing the patient is actually okay with going to the ER for evaluation today. 06/12/2019 on evaluation today patient actually appears to be doing significantly better compared to last time I saw her. Following when I last saw her she was actually in the hospital from that Monday until the following Sunday almost 1 full week. She actually was placed on Keflex in the hospital following the time for her to be discharged and Dr. Steva Ready has recommended 2 times a day dosing of the Keflex for the next year in order to help with more prophylactic/preventative measures with regard to her developing cellulitis. Overall I think this sounds like an excellent plan. The patient unfortunately is good to have trouble being treated at lymphedema clinic due to the fact that she really cannot get up on the bed that they have  there. They also state that they cannot manage her as long as she has anything draining at this point. Obviously that is somewhat unfortunate as she does need help with edema control but nonetheless we will have to do what we can for her outside of it sounds like the lymphedema clinic scenario at this point. 06/19/2019 on evaluation today patient appears to be doing fairly well with regard to her bilateral lower extremities. She is not nearly as swollen and shows no signs of infection at this point. There is no evidence of cellulitis whatsoever. She also has no open wounds or draining at this point which is also good news. No fever chills noted. She seems to be in very good spirits and in fact appears to be doing quite well. READMISSION 11/27/2019 This is a 66 year old woman that we have had in this clinic several times before including 2015, 16 and 19 and then most recently from 03/20/2019 through 06/19/2019 with bilateral lower extremity lymphedema. She has had previous arterial and reflux studies done years ago which were not all that remarkable. 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 She does have a positive family history and she had large legs starting may be in her 62s. She was recently in hospital from 10/20/2019 through 10/28/2019 with right leg cellulitis. She was given Ancef and clindamycin and then Zosyn when a culture showed Pseudomonas. At that time there was purulent drainage. She was followed by infectious disease Dr Steva Ready. The patient is now back at home. She has noted increased swelling in the right and no drainage in her right leg mostly on the posterior medial aspect in the calf area. She has not had pain or fever. She has literally been improved lysing above dressings because her at the area of this is far  too large for standard compression. She has been wrapping the areas with sheets to resorptive pads. She is found these  helped somewhat. She does have an appointment with the lymphedema clinic in [place] in late June. Past medical history includes bilateral lymphedema, hypertension, obstructive sleep apnea with CPAP. Recent hospitalization with apparently Pseudomonas cellulitis of the right lower leg 12/15/2019 upon evaluation today patient appears to be doing a little bit worse in regard to her right lower extremity. Unfortunately she is having more weeping down in the lower portion of her leg. Fortunately there is no signs of active infection at this time. No fever chills noted. The patient states she is not having increased pain except for when she attempted to use the lymphedema pumps unfortunately she states that she did have pain when she did this. Otherwise we been using absorptive dressings of one type or another she is using diapers at home and then subsequently Ace wraps. In regard to the barrier cream we have discussed the possibility of derma cloud which she would like to try I do not have a problem with that. 12/22/2019 upon evaluation today patient actually appears to be doing better in regard to her leg ulcers at this point. Fortunately there does not appear to be any signs of active infection which is great news and I am extremely pleased with where things are progressing at this time. There is no sign of active infection currently. The patient is very pleased to see things doing so well. 12/29/2019 upon evaluation today patient appears to be doing a little bit better in regard to her weeping in general over her lower extremities. She does have some signs of mild erythema little bit more than what I noted last week or rather last visit. Nonetheless I think that my threshold for switching her antibiotics from Keflex to something else is very low at this point considering that she has had such severe infections in the past that seem to come almost out of nowhere. There is a little erythema and warmth  noted of the lower portion of her leg compared to the upper which also makes me want to go ahead and address things more rapidly at this point. Likely I would switch out the Keflex for something like Levaquin ideally. 7/16; patient with severe bilateral lymphedema. She has superficial wounds albeit almost circumferential now on the left lateral lower leg. This may be new from last time. Small area on the right anterior lower leg and then another area on the right medial lower leg and of pannus fold. She has been using various absorptive garments. She states she is using her compression pumps once a day occasionally twice. Culture from her last visit here was negative 01/29/2020 on evaluation today patient appears to be doing excellent at this point in regard to her legs with regard to infection I see no signs of active infection at this point. She still does have unfortunately areas of weeping this is minimal on the right now her left is actually significantly worse although I do not think it is as bad as last week with Dr. Dellia Nims saw her. She has been trying to pump and elevate her legs is much as possible. She has previously been on the Keflex and in the past for prevention that seems to do fairly well and likely can extend that today. 02/04/2020 on evaluation today patient appears to be doing better in regard to her legs bilaterally. Fortunately there is no signs of  active infection at this time which is great news and overall she has less weeping on the left compared to the right and there is several spots where she is pretty much sealed up with no draining regions. Overall very pleased in this regard. 02/19/2020 on evaluation today patient appears to be doing very well in regard to her wounds currently. Fortunately there is no evidence of active infection overall very pleased with where things stand. She is significantly improved in regard to her edema I am extremely pleased in this regard she tells  me that the popping no longer hurts and in fact she actually looks forward to it. 03/04/2020 on evaluation today patient appears to be doing excellent in regard to her lower extremities. Fortunately there is no signs of active infection at this time. No fevers, chills, nausea, vomiting, or diarrhea. 03/25/2020 on evaluation today patient appears to be doing a little bit more poorly in regard to her legs at this point. She tells me that she is still continue to have issues with drainage and this has been a little bit worse she was getting ready to start taking the Keflex again but wanted to see me first. Fortunately there is no signs of active infection at this time. No fevers, chills, nausea, vomiting, or diarrhea. 04/15/2020 upon evaluation today patient appears to be doing somewhat poorly in regard to her right leg. She tells me she has been having more pain she has been taking the Keflex that was previously prescribed unfortunately that just does not seem to help with this. She was hoping that the pain on her right was actually coming from the fact that she was having issues with her wrap having gotten caught in her recliner. With that being said she tells me that she knew something was not right. Currently her right leg is warm to touch along with being erythematous all the way up to around at least mid thigh as far as I can see. The left leg does not appear to be doing that badly though there is increased weeping around the ankle region. 05/06/2020 on evaluation today patient appears to be doing much better than last time I saw her. She did go to the hospital where she was admitted for 2 days and treated with antibiotic therapy. She was discharged with antibiotics as well and has done extremely well. I am extremely pleased with where things stand today. There is no signs of active infection at this time which is great news. 05/27/2020 upon evaluation today patient appears to be doing well with  regard to her lower extremities bilaterally. She has just a very tiny area on the right leg which is opening on the left leg she is significantly improved though she still has several areas that do appear to be open this is minimal compared to what is been in the past. In general I am extremely pleased with where things stand today. The patient does tell me she is not been using her pumps quite as much as [name], [name] ([id number]) [id number] Page 4 of 14 she should be. I do believe that is 1 area she can definitely work on. She has had a lot going on including a Covid exposure and apparently also a outbreak of likely shingles. 06/24/2020 upon evaluation today patient appears to be doing well with regard to her legs in general although the left leg unfortunately is showing some signs of erythema she does have a little bit of increased weeping and  to be honest I am concerned about infection here. I discussed that with her today and I think that we may need to address this sooner rather than later she has been taking Keflex she is not really certain that is been making a big improvement however. No fevers, chills, nausea, vomiting, or diarrhea. 06/30/2020 upon evaluation today patient's legs actually seem to be doing better in my opinion as compared to where they were last week. Fortunately there does not appear to be any signs of active infection. Her culture showed multiple organisms nothing predominate. With that being said the Levaquin seems to have done well I think she has improved since I last saw her as well. 07/14/2020 upon evaluation today patient appears to be doing actually better in regard to her lower extremities in my opinion. She has been tolerating the dressing changes without complication. Fortunately there is no signs of active infection at this time. 08/04/2020 upon evaluation today patient appears to be doing excellent in regard to her leg ulcers.  Fortunately she has very little that is open at this point. This is great news. In fact I think that the Goldbond medicated powder has been excellent for her. It seems to have done the trick where we had tried several other things without as much success. Fortunately there is no evidence of active infection at this time. No fevers, chills, nausea, vomiting, or diarrhea. 09/01/2020 upon evaluation today patient appears to be doing a little bit more poorly than the last time I saw her. She tells me right now that she has been having a lot of drainage compared to where things were previous which has unfortunately led to more irritation as well. She is concerned that this is leading to infection. Again based on what I am seeing today as well I am also concerned of the same to be honest. 09/15/2020 upon evaluation today patient appears to be doing well with regard to her legs compared to what I saw previous. Fortunately there does not appear to be any evidence of active infection at this time which is great news. At least not as badly as what it was previous. With that being said I do believe that the patient does need to have an extension of the antibiotics. This I believe will actually help her more in the way of making sure this stays under control and does not worsen. That was the Bactrim DS. Readmission: 08/01/2021 upon evaluation today patient appears to be doing actually pretty well all things considered. Is actually been almost a year since have seen her last March. Fortunately I do not see any evidence of active infection locally nor systemically at this point. She does have a couple open areas on the left leg the right leg appears to be doing quite well. In general she has been in the hospital 3 times since I saw her a year ago all 4 cellulitis type issues except for the last 1 which was actually more related to congestive heart failure for that reason she is not using her lymphedema pumps at this  point and that is probably the best idea. 08/08/2021 upon evaluation today patient appears to be doing well with regard to her legs all things considered her left leg may be a little bit more swollen based on what I see today. Fortunately I do not see any signs of active infection locally nor systemically at this time which is great news. No fevers, chills, nausea, vomiting, or diarrhea. 08/15/2021 upon  evaluation today patient actually appears to be doing excellent in regard to her wounds. She has been tolerating the dressing changes without complication. Fortunately I see no evidence of infection currently which is great news. No fevers, chills, nausea, vomiting, or diarrhea. 08/29/2021 upon evaluation patient appears to be doing excellent she in fact is almost completely healed. I am actually very pleased with where we stand today and I think that she is making wonderful progress she just has a small area of weeping on the right lateral leg everything else is pretty much completely healed which is also. 09/05/2021 upon evaluation today patient appears to be doing well with regard to her legs. She does have a small area of weeping on the right leg and there is a small area on the left leg as well. It is potentially something that may need to be addressed in the future more specifically but right now I think that there may have just been a little drainage here on the left. With all that being said I think that this still does not appear to be infected and overall I feel like she is doing quite well. That something we always have to keep a very close eye on as she can change very rapidly from okay to not okay. 09/12/2021 upon evaluation today patient appears to be doing a little bit worse in regard to her leg and actually somewhat concerned about the possibility of infection here. I discussed that with the patient. For that reason I am going to go ahead and see about getting her set up for a repeat  prescription for the Bactrim. She is in agreement with that plan. 3/14; patient presents for follow-up. She has been using silver alginate with dressing changes. She is still taking Bactrim prescribed at last clinic visit. She has no issues or complaints today. She has lymphedema pumps however does not use these. 09/26/2020 upon evaluation today patient appears to actually be doing well in regard to her right leg I am pleased in that regard. Unfortunately she has new areas on her left leg that are open at this point that I do need to be addressed. I am also concerned about the warmth around one of the wounds on the more anterior side. I think this is something that we may need to culture today potentially requiring antibiotics going forward. 10/03/2021 upon evaluation today patient unfortunately is not doing nearly as well as she was even last week with her wounds. She is having some issues here with increased cellulitis of the left lower extremity unfortunately. I did prescribe Augmentin for her eczema prescribed last week unfortunately she never actually received this prescription. The pharmacy stated they never got it. We double checked with them today they still did not receive it. I am not sure what happened in that regard. With that being said the patient is in good spirits today she does not appear to be as sick as where she normally is when the cellulitis gets significantly worse as it has in the past. Nonetheless the leg is much more red not just around the ankle where I was seeing at last week on Tuesday but rather now this is going all the way up to almost her knee and is definitely hot to touch compared to the right leg. Nonetheless I feel like she does need to go to the ER for likely IV antibiotics. 10-10-2021 upon evaluation today patient appears to be doing well with regard to her wound. She  has been tolerating the dressing changes that appears to be doing much better. After I saw her last  week she did go to the ER they admitted her to the hospital and gave her IV antibiotics she fortunately is doing significantly better. Overall I am extremely pleased with where things stand today. 10-17-2021 upon evaluation today patient appears to be doing well with regard to her wound. In fact this is looking better and her leg is looking better but she still does have some areas here of erythema. We will continue to address this as soon as possible in my opinion. I do believe she may require some debridement and what appears to be a more defined wound on her left leg at this point. 10-24-2021 upon evaluation patient is definitely showing signs of improvement which is great news. I do not see any evidence of active infection locally or systemically which is great news as well. No fevers, chills, nausea, vomiting, or diarrhea. 10-31-2021. Upon evaluation today patient's leg actually feels better from an infection and wound care perspective. I am actually very pleased in this regard. Unfortunately the biggest issue that I see at this time is that the patient is having a significant issue with her breathing. I did put her on the pulse ox machine to check her oxygen saturation and it was pretty much at 100% which is good but she tells me that when she is walking this is much more difficult for her. She also tells me that when she is trying to bend over to perform her dressing changes she is so short of breath due to the swelling and fluid in her abdominal area that she is just not been able to do this. She is tearful and very concerned today to be honest. I am truly sorry to see her this way I know she is really having a hard time at the moment. 11-14-2021 upon evaluation patient actually appears to be doing significantly better compared to when I last saw her. She was actually admitted to the hospital I believe this for 6 days total after I sent her on 10-31-2021. Subsequently they actually pulled off greater  than 50 pounds of fluid she tells me she feels significantly better her legs are not as tight she actually has some play in the tissue and it is obvious that she is doing much better just from a mobility [name], [name] ([id number]) [id number] Page 5 of 14 standpoint as well as a breathing standpoint. Overall I am extremely happy for her and how she is doing the leg also looks much better on the left. 11-21-2021 upon evaluation today patient appears to be doing well although it does appear that she is going require some sharp debridement the wound is very dry this is the opposite of what its been she was having so much weeping that it was staying extremely wet. Nonetheless we do need to see about going ahead and debriding the wound today. 11-28-2021 upon evaluation today patient appears to be doing well with regard to her wound I definitely see signs of things continuing to improve which is great news. I do not see any evidence of active infection locally or systemically also great news. 12-05-2021 upon evaluation today patient appears to be doing well with regard to her wound. She has been tolerating the dressing changes. Fortunately there does not appear to be any signs of active infection locally or systemically at this time. No fevers, chills, nausea, vomiting, or diarrhea. 12-12-2021  upon evaluation patient appears to be doing well with regard to her wound this is actually measuring smaller and looking much better. Fortunately I do not see any signs of active infection locally or systemically at this time which is excellent news. 6/13; small wound area in the folds of her lymphedema just above the left ankle. Fortunately the area looks quite good. She has been using silver alginate ABDs and an Ace wrap which she is able to change her self. She is not using her compression pumps out of fear that this could contribute to heart failure. 12-26-2021 upon evaluation patient's  wounds are actually showing signs of excellent improvement. I am very pleased with where things stand. Overall I think that she has been making excellent progress and I think we are very close to complete resolution which is great news. 01-11-2022 upon evaluation today patient appears to be doing well currently in regard to her legs in general although on the left leg she does have 1 area that still irritated and inflamed on the anterior portion of her leg. Fortunately I do not see any signs of systemic infection locally there might be some infection going on here however which is my biggest concern. Fortunately I do not see any evidence though of this spreading to any other location. 01-16-2022 upon evaluation today patient has actually showing signs of excellent improvement. Fortunately I do not see any evidence of infection locally or systemically which is great news and overall I am very pleased with where we stand at this point. 01-25-2022 upon evaluation today patient actually appears to be doing decently well in regard to her legs. She has been tolerating the dressing changes without complication. Fortunately there does not appear to be any evidence of active infection locally or systemically which is great news and overall I am extremely pleased with where we stand at this point. 02-13-2022 upon evaluation today patient appears to be doing well currently in regard to her wound which is actually showing signs of significant improvement. Fortunately I do not see any evidence of active infection locally or systemically at this time. No fevers, chills, nausea, vomiting, or diarrhea. 02-20-2022 upon evaluation today patient appears to be doing well currently in regard to her wound. She has been tolerating the dressing changes without complication. Fortunately there does not appear to be any signs of active infection locally or systemically at this time. No fevers, chills, nausea, vomiting,  or diarrhea. 02-27-2022 upon evaluation today patient appears to be doing excellent in regard to her wounds on the leg. She has been tolerating the dressing changes without complication this is very close to complete resolution. Fortunately I do not see any signs of infection locally or systemically at this time which is great news. No fevers, chills, nausea, vomiting, or diarrhea. 03-06-2022 upon evaluation today patient appears to be doing well currently in regard to her wound in general. She has been tolerating the dressing changes without complication. Fortunately I do not see any evidence of active infection locally or systemically at this time which is great news. I am concerned a little bit however about not really her wounds but the second toe right foot where there appears to be some cellulitis after she dropped a frying pan on this about 2 weeks ago. She tells me its been itching and burning and I do think that it is possible she may have an infection based on what we are seeing currently. 03-20-2022 upon evaluation today patient actually appears  to be doing much better at this point. Fortunately there does not appear to be any signs of active infection locally or systemically at this time which is great news. No fevers, chills, nausea, vomiting, or diarrhea. 03-27-2022 upon evaluation today patient appears to be doing somewhat poorly in regard to her legs compared to previous. Her right leg has a couple areas that are open and she is very warm and painful to touch around the lateral portion of the ankle area. Left leg is showing signs of little bit more drainage and weeping as well I think that this is probably due to the fact that she is swelling a lot more that she has been in the past. Fortunately I do not see any signs of active infection systemically though locally there is definitely some issues here. 04-03-2022 upon evaluation today patient appears to be doing poorly in regard to her  wounds on the legs unfortunately. She also seems to have more significant cellulitis that has ensued. Unfortunately she has been on doxycycline initially it seemed to be helping but as of this week that is no longer the case. It started getting worse last week and with the reinitiation of the doxycycline nothing has improved. Often when she gets like this IV antibiotics are necessary. 04-19-2022 upon evaluation today patient appears to be doing well currently in regard to her legs from an infection standpoint. She did get IV vancomycin in the hospital and states that her legs did get better when she left she was no longer weeping but she had been in the bed for 4 days straight. With that being said since coming home her legs have begun to weep again the original wound that we were dealing with has healed she does have some weeping over both lower extremities however which is still of concern. 05-03-2022 upon evaluation today patient appears to be doing well currently in regard to her left leg which I feel like is a little bit better unfortunately the right leg is still draining significantly. Fortunately I do not see any signs of infection locally or systemically at this time. No fevers, chills, nausea, vomiting, or diarrhea. 05-10-2022 unfortunately patient actually showed signs of increasing erythema today. I believe that the cellulitis never completely resolved from when she was in the hospital last. Again she has been on doxycycline previously by myself I gave her Keflex more recently as an ongoing prescription to try to prevent infection but again I do not think this is strong enough to be able to get this under control. I really feel like that she is probably can need IV antibiotics and a good period of time in the hospital to be able to keep her legs elevated and not having to get around to do a whole lot in order to allow this to resolve. The patient voiced understanding. 05-24-2022 upon  evaluation today patient appears to be doing excellent in regard to her legs. She has been tolerating the dressing changes without complication. Fortunately there does not appear to be any signs of infection at this time which is great news she has been on cefadroxil since she was discharged from the hospital. The day that we sent her she actually tells me that she ended up in septic shock that day and coded. When she first showed up they did not notice it on the lab work but said by the end of the day she had already undergone this and they had had to address the issue  obviously when she coded. She tells me that likely she would not be here today if she had not gone to the hospital that day. I explained to her that I do believe that was a God thing and it was just the perfect timing that he had for her at that time. Electronic Signature(s) Signed: 05/25/2022 2:21:03 PM By: Worthy Keeler PA-C Previous Signature: 05/24/2022 11:06:37 AM Version By: Worthy Keeler PA-C Entered By: Worthy Keeler on 05/25/2022 14:21:02 KYSA, CALAIS ([id number]) [id number] Page 6 of 14 -------------------------------------------------------------------------------- Physical Exam Details Patient Name: Date of Service: DEVANI, ODONNEL 05/24/2022 10:30 A M Medical Record Number: [id number] Patient Account Number: [id number] Date of Birth/Sex: Treating RN: 1955/12/28 (65 y.o. [name] Primary Care Provider: Ranae Plumber Other Clinician: Massie Kluver Referring Provider: Treating Provider/Extender: Skeet Simmer in Treatment: 74 Constitutional Obese and well-hydrated in no acute distress. Respiratory normal breathing without difficulty. Psychiatric this patient is able to make decisions and demonstrates good insight into disease process. Alert and Oriented x 3. pleasant and cooperative. Notes Upon inspection patient's legs actually appear to be  completely dry which is great news and overall I am extremely pleased with where we stand today. I do not think she is showing any signs of weightbearing and this is great news I am much happier today than last time I saw her 2 weeks ago. Electronic Signature(s) Signed: 05/25/2022 2:21:30 PM By: Worthy Keeler PA-C Entered By: Worthy Keeler on 05/25/2022 14:21:30 -------------------------------------------------------------------------------- Physician Orders Details Patient Name: Date of Service: JAIONA, SIMIEN 05/24/2022 10:30 A M Medical Record Number: [id number] Patient Account Number: [id number] Date of Birth/Sex: Treating RN: Feb 21, 1956 (66 y.o. 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 Nurse Visit as needed QUINCEY, NORED ([id number]) [id number] Page 7 of 14 Bathing/ Shower/ Hygiene Clean wound with Normal Saline or wound cleanser. Wash wounds with antibacterial soap and water. May shower; gently cleanse wound with antibacterial soap, rinse and pat dry prior to dressing wounds No tub bath. Edema Control - Lymphedema / Segmental Compressive Device / Other A wraps ce Elevate, Exercise Daily and A void Standing for Long Periods of Time. Elevate leg(s) parallel to the floor when sitting. DO YOUR BEST to sleep in the bed at night. DO NOT sleep in your recliner. Long hours of sitting in a recliner leads to swelling of the legs and/or potential wounds on your  backside. Other: - use lymphedema pumps Non-Wound Condition Bilateral Lower Extremities Cleanse affected area with antibacterial soap and water, pply appropriate compression. - use abdominal pads them ace wrap to secure A dditional non-wound orders/instructions: - silver cell to open weeping areas A Medications-Please add to medication list. ntibiotics - continue cefadroxil as directed P.O. A Electronic Signature(s) Signed: 05/24/2022 5:06:55 PM By: Massie Kluver Signed: 05/25/2022 2:32:55 PM By: Worthy Keeler PA-C Entered By: Massie Kluver on 05/24/2022 11:10:51 -------------------------------------------------------------------------------- Problem List Details Patient Name: Date of Service: INDYA, OLIVERIA 05/24/2022 10:30 A M Medical Record Number: [id number] Patient Account Number: [id number] Date of Birth/Sex: Treating RN:  [birth year] (66 y.o. 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 08/01/2021 No Yes of skin L97.821 Non-pressure chronic ulcer of other part of left lower leg limited to breakdown 08/01/2021 No Yes of skin Inactive Problems Resolved Problems Electronic Signature(s) [name], [name] ([id number]) [id number] Page 8 of 14 Signed: 05/24/2022 10:37:42 AM By: Worthy Keeler PA-C Entered By: Worthy Keeler on 05/24/2022 10:37:41 -------------------------------------------------------------------------------- Progress Note Details Patient Name: Date of Service: MIZANI, DILDAY 05/24/2022 10:30 A M Medical Record Number: [id number] Patient Account Number: [id number] Date of  Birth/Sex: Treating RN: Feb 16, 1956 (66 y.o. [name] Primary Care Provider: Ranae Plumber Other Clinician: Massie Kluver Referring Provider: Treating Provider/Extender: Skeet Simmer in Treatment: 56 Subjective Chief Complaint Information obtained from Patient Bilateral LE lymphedema with Left LE Ulcers History of Present Illness (HPI) The patient is a 66 year old female with history of hypertension and a long-standing history of bilateral lower extremity lymphedema (first presented on 4/2) . She has had open ulcers in the past which have always responded to compression therapy. She had briefly been to a lymphedema clinic in the past which helped her at the time. this time around she stopped treatment of her lymphedema pumps approximately 2 weeks ago because of some pain in the knees and then noticed the right leg getting worse. She was seen by her PCP who put her on clindamycin 4 times a day 2 days ago. The patient has seen [facility] and Dr. Delana Meyer had seen her last year where a vascular study including venous and arterial duplex studies were within normal limits. he had recommended compression stockings and lymphedema pumps and the patient has been using this in about 2 weeks ago. She is known to be diabetic but in the past few time she's gone to her primary care doctor her hemoglobin A1c has been normal. 02/11/2015 - after her last visit she took my advice and went to the ER regarding the progressive cellulitis of her right lower extremity and she was admitted between July 17 and 22nd. She received IV antibiotics and then was sent home on a course of steroid-induced and oral antibiotics. She has improved much since then. 02/17/2015 -- she has been doing fine and the weeping of her legs has remarkably gone down. She has no fresh issues. READMISSION 01/15/18 This patient was given this clinic before most recently in 2016 seen by Dr. Con Memos. She has massive bilateral  lymphedema and over the last 2 months this had weeping edema out of the left leg. She has compression pumps but her compliance with these has been minimal. She has [facility] they've been using TCA/ABDs/kerlix under an Ace wrap.she has had recent problems with cellulitis. She was apparently seen in the ER and 12/23/17 and given clindamycin. She was then followed by her primary doctor and given doxycycline and Keflex. The pain seems to have settled down. In April 2018 the patient had arterial studies done at [facility]. This showed triphasic waveforms throughout the right leg and mostly triphasic waveforms on the left except for monophasic at the posterior tibial artery distally. She was not felt to have evidence of right lower extremity arterial stenosis or significant problems on the left  side. 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 She also had a right lower extremity venous Doppler in January 2018 this was limited by the patient's body habitus and lymphedema. Most of the proximal veins were not visualized The patient presents with an area of denuded skin on the anterior medial part of the left calf. There is weeping edema fluid here. 01/22/18; the patient has somewhat better edema control using her compression pumps twice a day and as a result she has much better epithelialization on the left anterior calf area. Only a small open area remains. 01/29/18; the patient has been compliant with her compression pumps. Both the areas on her calf that healed. The remaining area on the left anterior leg is fully epithelialized Readmission: 02/20/2019 upon evaluation today patient presents for reevaluation due to issues that she is having with the bilateral lower extremities. She actually has wounds open on both legs. On the right she has an area in the crease of her leg on the right around the knee region which is actually draining quite a bit and  actually has some fungal type appearance to it. She has been on nystatin powder that seems to have helped to some degree. In regard to the left lower extremity this is actually in the lower portion of her leg closer to the ankle and again is continuing to drain as well unfortunately. There does not appear to be any signs of active infection at this time which is good news. No fevers, chills, nausea, vomiting, or diarrhea. She tells me that since she was seen last year she is actually been doing quite well for the most part with regard to her lower extremities. Unfortunately she now is experiencing a little bit more drainage at this time. She is concerned about getting this under control so that it does not get significantly worse. 02/27/2019 on evaluation today patient appears to be doing somewhat better in regard to her bilateral lower extremity wounds. She has been tolerating the dressing changes without complication. Fortunately there is no signs of active infection at this point. No fevers, chills, nausea, vomiting, or diarrhea. She did get her dressing supplies which is excellent news she was extremely excited to get these. She also got paperwork from [employer] for their financial assistance program where they may be able to help her out in the future if needed with supplies at discounted prices. 03/06/2019 on evaluation today patient appears to be doing a little worse with regard to both areas of weeping on her bilateral lower extremities. This is around the right medial knee and just above the left ankle. With that being said she is unfortunately not doing as well as I would like to see. I feel like she may need to [name], [name] ([id number]) [id number] Page 9 of 14 potentially go see someone at the lymphedema clinic as the wraps that she needs or even beyond what we can do here at the wound care center. She really does not have wounds she just has open areas of weeping  that are causing some difficulty for her. Subsequently because of this and the moisture I am concerned about the potential for infection I am going to likely give her a prophylactic antibiotic today, Keflex, just to be on the safe side. Nonetheless again there is no obvious signs of active infection at this time. 03/13/2019 on evaluation today patient appears to be doing well with regard to her bilateral lower extremities where she has  been weeping compared to even last week's evaluation. I see some areas of new skin growth which is excellent and overall I am very pleased with how things seem to be progressing. No fevers, chills, nausea, vomiting, or diarrhea. 03/20/2019 on evaluation today patient unfortunately is continuing to have issues with significant edema of the left lower extremity. Her right side seems to be doing much better. Unfortunately her left side is showing increased weeping of the lower portion of her leg. This is quite unfortunate obviously we were hoping to get her into the lymphedema clinic they really do not seem to when I see her how if she is draining. Despite the fact this is really not wound related but more lymphedema weeping related. Nonetheless I do not know that this can be helpful for her to even go for that appointment since again I am not sure there is much that they would actually do at this point. We may need to try a 4 layer compression wrap as best we can on her leg. She is on the Augmentin currently although I am still concerned about whether or not there could be potentially something going on infection wise I would obtain a culture though I understand is not the best being that is a surface culture I just 1 to make sure I do not seem to be missing anything. 03/27/2019 on evaluation today patient appears to be doing much better in regard to the left lower extremity compared to last week. Last week she had tremendous weeping which I think was subsequent to infection  now she seems to be doing much better and very pleased. This is not completely healed but there is a lot of new skin growth and it has dried out quite a bit. Overall I think that we are doing well with how things are moving along at this time. No fevers, chills, nausea, vomiting, or diarrhea. 04/03/2019 evaluation today patient appears to be doing a little worse this week compared to last time I saw her. I think this may be due to the fact that she is having issues with not being able to sleep in her bed at least not until last night. She is therefore been in a lift chair and subsequently has also had issues with not been able to use her pumps since she could not get in bed. With that being said the patient overall seems to be doing okay I do think I may want extend the antibiotic for a little bit longer at least until we can see if her edema and her weeping gets better and if it is then obviously I can always discontinue the antibiotics as of next week however I want her to continue to have it over the next week. 04/10/2019 on evaluation today patient unfortunately is still doing poorly with regard to her left lower extremity. Her right is all things considering doing fairly well. On the left however she continues to have spreading of the area of infection and weeping which appears to be even a larger surface area than noted last week. She did have a positive culture for Pseudomonas in particular which seems to have been of concern she still has green/yellow discharge consistent with Pseudomonas and subsequently a tremendous amount of it. This has me obviously still concerned about the infection not really clearing up despite the fact that on culture it appears the Cipro should have been a good option for treating this. I think she may at this point  need IV antibiotics since things are not doing better I do not want to get worse and cause sepsis. She is in agreement with the plan and believes as well  that she likely does need to go to the hospital for IV vancomycin. Or something of the like depending on what the recommendation is from the ER. 04/17/2019 on evaluation today patient appears to be doing excellent in regard to her lower extremity on the left. She was in the hospital for several days from when I sent her last we saw her until just this past Tuesday. Fortunately her drainage is significantly improved and in fact is mostly clear. There is just a couple small areas that may still drain a little bit she states that the [facility] they prescribed for her at discharge she went picked up from pharmacy and got home but has not been able to find it since. She is looked everywhere. She is wondering if I will replace that for her today I will be more than happy to do that. 05/01/2019 on evaluation today patient actually appears to be doing quite well with regard to her lower extremities. She occasionally is having areas that will leak and then heal up mainly when a piece of the fibrotic skin pops off but fortunately she is not having any signs of active infection at this time. Overall she also really does not have any obvious weeping at this time. I do believe however she really needs some compression wraps and I think this may be a good time to get her back to the lymphedema clinic. 05/11/2019 on evaluation today patient actually appears to be doing quite well with regard to her bilateral lower extremities. She occasionally will have a small area that we per another but in general seems to be completely healed which is great news. Overall very pleased with how everything seems to be progressing. She does have her appointment with lymphedema clinic on November 18. 05/25/2019 on evaluation today patient appears to be doing well with regard to her left lower extremity. I am very pleased in this regard. In regard to her right leg this actually did start draining more I think it is mainly due to the fact  that her leg is more swollen. I am not seeing any obvious signs of infection at this time although that is definitely something were obviously acutely aware of simply due to the fact that she had an issue not too far back with exactly this issue. Nonetheless I do feel like that lymphedema clinic would still be beneficial for her. I explained obviously if they are not able to do anything treatment wise on the right leg we could at least have them treat her left leg and then proceed from there. The patient is really in agreement with that plan. If they are able to do both as the drainage slows down that I would be happy to let them handle both. 06/01/2019 on evaluation today patient unfortunately appears to be doing worse with regard to her right lower extremity. The left lower extremity is still maintaining at this point. Unfortunately she has been having significantly increased pain over the past several days and has been experiencing as well increased swelling of the right lower extremity. I really do not know that I am seeing anything that appears to be obvious for infection at this point to be peripherally honest. With that being said the patient does seem to be having much more swelling that she is even  experienced in the past and coupled with increased pain in her hip as well I am concerned that again she could potentially have a DVT although I am not 100% sure of this. I think it something that may need to be checked out. We discussed the possibility of sending her for a DVT study through the hospital but unfortunately transportation is an issue if she does have a DVT I do not want her to wait days to be able to get in for that test however if she has this scheduled as an outpatient that is as fast that she will be able to get the test scheduled for transportation purposes. That will also fall on Thanksgiving so subsequently she did actually be looking at either Friday or even next week before we  would know anything back from this. That is much too long in my opinion. Subsequent to the amount of discomfort she is experiencing the patient is actually okay with going to the ER for evaluation today. 06/12/2019 on evaluation today patient actually appears to be doing significantly better compared to last time I saw her. Following when I last saw her she was actually in the hospital from that Monday until the following Sunday almost 1 full week. She actually was placed on Keflex in the hospital following the time for her to be discharged and Dr. Steva Ready has recommended 2 times a day dosing of the Keflex for the next year in order to help with more prophylactic/preventative measures with regard to her developing cellulitis. Overall I think this sounds like an excellent plan. The patient unfortunately is good to have trouble being treated at lymphedema clinic due to the fact that she really cannot get up on the bed that they have there. They also state that they cannot manage her as long as she has anything draining at this point. Obviously that is somewhat unfortunate as she does need help with edema control but nonetheless we will have to do what we can for her outside of it sounds like the lymphedema clinic scenario at this point. 06/19/2019 on evaluation today patient appears to be doing fairly well with regard to her bilateral lower extremities. She is not nearly as swollen and shows no signs of infection at this point. There is no evidence of cellulitis whatsoever. She also has no open wounds or draining at this point which is also good news. No fever chills noted. She seems to be in very good spirits and in fact appears to be doing quite well. READMISSION 11/27/2019 This is a 66 year old woman that we have had in this clinic several times before including 2015, 16 and 19 and then most recently from 03/20/2019 through 06/19/2019 with bilateral lower extremity lymphedema. She has had previous  arterial and reflux studies done years ago which were not all that remarkable. 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 She does have a positive family history and she had large legs starting may be in her 30s. She was recently in hospital from 10/20/2019 through 10/28/2019 with right leg cellulitis. She was given Ancef and clindamycin and then Zosyn when a culture showed Pseudomonas. At that time there was purulent drainage. She was followed by infectious disease Dr Steva Ready. The patient is now back at home. She has noted increased swelling in the right and no drainage in her right leg mostly on the posterior medial aspect in the calf area. She has not had pain or fever.  She has literally been improved lysing above dressings because her at the area of this is far too large for standard compression. She has been wrapping the areas with sheets to resorptive pads. She is found these helped somewhat. She does have an appointment with the [name], [name] ([id number]) [id number] Page 10 of 14 lymphedema clinic in [place] in late June. Past medical history includes bilateral lymphedema, hypertension, obstructive sleep apnea with CPAP. Recent hospitalization with apparently Pseudomonas cellulitis of the right lower leg 12/15/2019 upon evaluation today patient appears to be doing a little bit worse in regard to her right lower extremity. Unfortunately she is having more weeping down in the lower portion of her leg. Fortunately there is no signs of active infection at this time. No fever chills noted. The patient states she is not having increased pain except for when she attempted to use the lymphedema pumps unfortunately she states that she did have pain when she did this. Otherwise we been using absorptive dressings of one type or another she is using diapers at home and then subsequently Ace wraps. In regard to the  barrier cream we have discussed the possibility of derma cloud which she would like to try I do not have a problem with that. 12/22/2019 upon evaluation today patient actually appears to be doing better in regard to her leg ulcers at this point. Fortunately there does not appear to be any signs of active infection which is great news and I am extremely pleased with where things are progressing at this time. There is no sign of active infection currently. The patient is very pleased to see things doing so well. 12/29/2019 upon evaluation today patient appears to be doing a little bit better in regard to her weeping in general over her lower extremities. She does have some signs of mild erythema little bit more than what I noted last week or rather last visit. Nonetheless I think that my threshold for switching her antibiotics from Keflex to something else is very low at this point considering that she has had such severe infections in the past that seem to come almost out of nowhere. There is a little erythema and warmth noted of the lower portion of her leg compared to the upper which also makes me want to go ahead and address things more rapidly at this point. Likely I would switch out the Keflex for something like Levaquin ideally. 7/16; patient with severe bilateral lymphedema. She has superficial wounds albeit almost circumferential now on the left lateral lower leg. This may be new from last time. Small area on the right anterior lower leg and then another area on the right medial lower leg and of pannus fold. She has been using various absorptive garments. She states she is using her compression pumps once a day occasionally twice. Culture from her last visit here was negative 01/29/2020 on evaluation today patient appears to be doing excellent at this point in regard to her legs with regard to infection I see no signs of active infection at this point. She still does have unfortunately areas of  weeping this is minimal on the right now her left is actually significantly worse although I do not think it is as bad as last week with Dr. Dellia Nims saw her. She has been trying to pump and elevate her legs is much as possible. She has previously been on the Keflex and in the past for prevention that seems to do fairly well and likely  can extend that today. 02/04/2020 on evaluation today patient appears to be doing better in regard to her legs bilaterally. Fortunately there is no signs of active infection at this time which is great news and overall she has less weeping on the left compared to the right and there is several spots where she is pretty much sealed up with no draining regions. Overall very pleased in this regard. 02/19/2020 on evaluation today patient appears to be doing very well in regard to her wounds currently. Fortunately there is no evidence of active infection overall very pleased with where things stand. She is significantly improved in regard to her edema I am extremely pleased in this regard she tells me that the popping no longer hurts and in fact she actually looks forward to it. 03/04/2020 on evaluation today patient appears to be doing excellent in regard to her lower extremities. Fortunately there is no signs of active infection at this time. No fevers, chills, nausea, vomiting, or diarrhea. 03/25/2020 on evaluation today patient appears to be doing a little bit more poorly in regard to her legs at this point. She tells me that she is still continue to have issues with drainage and this has been a little bit worse she was getting ready to start taking the Keflex again but wanted to see me first. Fortunately there is no signs of active infection at this time. No fevers, chills, nausea, vomiting, or diarrhea. 04/15/2020 upon evaluation today patient appears to be doing somewhat poorly in regard to her right leg. She tells me she has been having more pain she has been taking the  Keflex that was previously prescribed unfortunately that just does not seem to help with this. She was hoping that the pain on her right was actually coming from the fact that she was having issues with her wrap having gotten caught in her recliner. With that being said she tells me that she knew something was not right. Currently her right leg is warm to touch along with being erythematous all the way up to around at least mid thigh as far as I can see. The left leg does not appear to be doing that badly though there is increased weeping around the ankle region. 05/06/2020 on evaluation today patient appears to be doing much better than last time I saw her. She did go to the hospital where she was admitted for 2 days and treated with antibiotic therapy. She was discharged with antibiotics as well and has done extremely well. I am extremely pleased with where things stand today. There is no signs of active infection at this time which is great news. 05/27/2020 upon evaluation today patient appears to be doing well with regard to her lower extremities bilaterally. She has just a very tiny area on the right leg which is opening on the left leg she is significantly improved though she still has several areas that do appear to be open this is minimal compared to what is been in the past. In general I am extremely pleased with where things stand today. The patient does tell me she is not been using her pumps quite as much as she should be. I do believe that is 1 area she can definitely work on. She has had a lot going on including a Covid exposure and apparently also a outbreak of likely shingles. 06/24/2020 upon evaluation today patient appears to be doing well with regard to her legs in general although the left leg unfortunately  is showing some signs of erythema she does have a little bit of increased weeping and to be honest I am concerned about infection here. I discussed that with her today and I think  that we may need to address this sooner rather than later she has been taking Keflex she is not really certain that is been making a big improvement however. No fevers, chills, nausea, vomiting, or diarrhea. 06/30/2020 upon evaluation today patient's legs actually seem to be doing better in my opinion as compared to where they were last week. Fortunately there does not appear to be any signs of active infection. Her culture showed multiple organisms nothing predominate. With that being said the Levaquin seems to have done well I think she has improved since I last saw her as well. 07/14/2020 upon evaluation today patient appears to be doing actually better in regard to her lower extremities in my opinion. She has been tolerating the dressing changes without complication. Fortunately there is no signs of active infection at this time. 08/04/2020 upon evaluation today patient appears to be doing excellent in regard to her leg ulcers. Fortunately she has very little that is open at this point. This is great news. In fact I think that the [employer] medicated powder has been excellent for her. It seems to have done the trick where we had tried several other things without as much success. Fortunately there is no evidence of active infection at this time. No fevers, chills, nausea, vomiting, or diarrhea. 09/01/2020 upon evaluation today patient appears to be doing a little bit more poorly than the last time I saw her. She tells me right now that she has been having a lot of drainage compared to where things were previous which has unfortunately led to more irritation as well. She is concerned that this is leading to infection. Again based on what I am seeing today as well I am also concerned of the same to be honest. 09/15/2020 upon evaluation today patient appears to be doing well with regard to her legs compared to what I saw previous. Fortunately there does not appear to be any evidence of active infection at  this time which is great news. At least not as badly as what it was previous. With that being said I do believe that the patient does need to have an extension of the antibiotics. This I believe will actually help her more in the way of making sure this stays under control and does not worsen. That was the Bactrim DS. Readmission: 08/01/2021 upon evaluation today patient appears to be doing actually pretty well all things considered. Is actually been almost a year since have seen her last March. Fortunately I do not see any evidence of active infection locally nor systemically at this point. She does have a couple open areas on the left leg the right leg appears to be doing quite well. In general she has been in the hospital 3 times since I saw her a year ago all 4 cellulitis type issues except for the last 1 which was actually more related to congestive heart failure for that reason she is not using her lymphedema pumps at this point and that is probably the best idea. [name], [name] ([id number]) [id number] Page 11 of 14 08/08/2021 upon evaluation today patient appears to be doing well with regard to her legs all things considered her left leg may be a little bit more swollen based on what I see today. Fortunately I do not  see any signs of active infection locally nor systemically at this time which is great news. No fevers, chills, nausea, vomiting, or diarrhea. 08/15/2021 upon evaluation today patient actually appears to be doing excellent in regard to her wounds. She has been tolerating the dressing changes without complication. Fortunately I see no evidence of infection currently which is great news. No fevers, chills, nausea, vomiting, or diarrhea. 08/29/2021 upon evaluation patient appears to be doing excellent she in fact is almost completely healed. I am actually very pleased with where we stand today and I think that she is making wonderful progress she just has a  small area of weeping on the right lateral leg everything else is pretty much completely healed which is also. 09/05/2021 upon evaluation today patient appears to be doing well with regard to her legs. She does have a small area of weeping on the right leg and there is a small area on the left leg as well. It is potentially something that may need to be addressed in the future more specifically but right now I think that there may have just been a little drainage here on the left. With all that being said I think that this still does not appear to be infected and overall I feel like she is doing quite well. That something we always have to keep a very close eye on as she can change very rapidly from okay to not okay. 09/12/2021 upon evaluation today patient appears to be doing a little bit worse in regard to her leg and actually somewhat concerned about the possibility of infection here. I discussed that with the patient. For that reason I am going to go ahead and see about getting her set up for a repeat prescription for the Bactrim. She is in agreement with that plan. 3/14; patient presents for follow-up. She has been using silver alginate with dressing changes. She is still taking Bactrim prescribed at last clinic visit. She has no issues or complaints today. She has lymphedema pumps however does not use these. 09/26/2020 upon evaluation today patient appears to actually be doing well in regard to her right leg I am pleased in that regard. Unfortunately she has new areas on her left leg that are open at this point that I do need to be addressed. I am also concerned about the warmth around one of the wounds on the more anterior side. I think this is something that we may need to culture today potentially requiring antibiotics going forward. 10/03/2021 upon evaluation today patient unfortunately is not doing nearly as well as she was even last week with her wounds. She is having some issues here with  increased cellulitis of the left lower extremity unfortunately. I did prescribe Augmentin for her eczema prescribed last week unfortunately she never actually received this prescription. The pharmacy stated they never got it. We double checked with them today they still did not receive it. I am not sure what happened in that regard. With that being said the patient is in good spirits today she does not appear to be as sick as where she normally is when the cellulitis gets significantly worse as it has in the past. Nonetheless the leg is much more red not just around the ankle where I was seeing at last week on Tuesday but rather now this is going all the way up to almost her knee and is definitely hot to touch compared to the right leg. Nonetheless I feel like she does  need to go to the ER for likely IV antibiotics. 10-10-2021 upon evaluation today patient appears to be doing well with regard to her wound. She has been tolerating the dressing changes that appears to be doing much better. After I saw her last week she did go to the ER they admitted her to the hospital and gave her IV antibiotics she fortunately is doing significantly better. Overall I am extremely pleased with where things stand today. 10-17-2021 upon evaluation today patient appears to be doing well with regard to her wound. In fact this is looking better and her leg is looking better but she still does have some areas here of erythema. We will continue to address this as soon as possible in my opinion. I do believe she may require some debridement and what appears to be a more defined wound on her left leg at this point. 10-24-2021 upon evaluation patient is definitely showing signs of improvement which is great news. I do not see any evidence of active infection locally or systemically which is great news as well. No fevers, chills, nausea, vomiting, or diarrhea. 10-31-2021. Upon evaluation today patient's leg actually feels better from  an infection and wound care perspective. I am actually very pleased in this regard. Unfortunately the biggest issue that I see at this time is that the patient is having a significant issue with her breathing. I did put her on the pulse ox machine to check her oxygen saturation and it was pretty much at 100% which is good but she tells me that when she is walking this is much more difficult for her. She also tells me that when she is trying to bend over to perform her dressing changes she is so short of breath due to the swelling and fluid in her abdominal area that she is just not been able to do this. She is tearful and very concerned today to be honest. I am truly sorry to see her this way I know she is really having a hard time at the moment. 11-14-2021 upon evaluation patient actually appears to be doing significantly better compared to when I last saw her. She was actually admitted to the hospital I believe this for 6 days total after I sent her on 10-31-2021. Subsequently they actually pulled off greater than 50 pounds of fluid she tells me she feels significantly better her legs are not as tight she actually has some play in the tissue and it is obvious that she is doing much better just from a mobility standpoint as well as a breathing standpoint. Overall I am extremely happy for her and how she is doing the leg also looks much better on the left. 11-21-2021 upon evaluation today patient appears to be doing well although it does appear that she is going require some sharp debridement the wound is very dry this is the opposite of what its been she was having so much weeping that it was staying extremely wet. Nonetheless we do need to see about going ahead and debriding the wound today. 11-28-2021 upon evaluation today patient appears to be doing well with regard to her wound I definitely see signs of things continuing to improve which is great news. I do not see any evidence of active infection  locally or systemically also great news. 12-05-2021 upon evaluation today patient appears to be doing well with regard to her wound. She has been tolerating the dressing changes. Fortunately there does not appear to be any signs  of active infection locally or systemically at this time. No fevers, chills, nausea, vomiting, or diarrhea. 12-12-2021 upon evaluation patient appears to be doing well with regard to her wound this is actually measuring smaller and looking much better. Fortunately I do not see any signs of active infection locally or systemically at this time which is excellent news. 6/13; small wound area in the folds of her lymphedema just above the left ankle. Fortunately the area looks quite good. She has been using silver alginate ABDs and an Ace wrap which she is able to change her self. She is not using her compression pumps out of fear that this could contribute to heart failure. 12-26-2021 upon evaluation patient's wounds are actually showing signs of excellent improvement. I am very pleased with where things stand. Overall I think that she has been making excellent progress and I think we are very close to complete resolution which is great news. 01-11-2022 upon evaluation today patient appears to be doing well currently in regard to her legs in general although on the left leg she does have 1 area that still irritated and inflamed on the anterior portion of her leg. Fortunately I do not see any signs of systemic infection locally there might be some infection going on here however which is my biggest concern. Fortunately I do not see any evidence though of this spreading to any other location. 01-16-2022 upon evaluation today patient has actually showing signs of excellent improvement. Fortunately I do not see any evidence of infection locally or systemically which is great news and overall I am very pleased with where we stand at this point. 01-25-2022 upon evaluation today patient  actually appears to be doing decently well in regard to her legs. She has been tolerating the dressing changes without complication. Fortunately there does not appear to be any evidence of active infection locally or systemically which is great news and overall I am extremely pleased with where we stand at this point. 02-13-2022 upon evaluation today patient appears to be doing well currently in regard to her wound which is actually showing signs of significant improvement. [name], [name] ([id number]) [id number] Page 12 of 14 Fortunately I do not see any evidence of active infection locally or systemically at this time. No fevers, chills, nausea, vomiting, or diarrhea. 02-20-2022 upon evaluation today patient appears to be doing well currently in regard to her wound. She has been tolerating the dressing changes without complication. Fortunately there does not appear to be any signs of active infection locally or systemically at this time. No fevers, chills, nausea, vomiting, or diarrhea. 02-27-2022 upon evaluation today patient appears to be doing excellent in regard to her wounds on the leg. She has been tolerating the dressing changes without complication this is very close to complete resolution. Fortunately I do not see any signs of infection locally or systemically at this time which is great news. No fevers, chills, nausea, vomiting, or diarrhea. 03-06-2022 upon evaluation today patient appears to be doing well currently in regard to her wound in general. She has been tolerating the dressing changes without complication. Fortunately I do not see any evidence of active infection locally or systemically at this time which is great news. I am concerned a little bit however about not really her wounds but the second toe right foot where there appears to be some cellulitis after she dropped a frying pan on this about 2 weeks ago. She tells me its been itching and burning and  I do think that it is possible she may have an infection based on what we are seeing currently. 03-20-2022 upon evaluation today patient actually appears to be doing much better at this point. Fortunately there does not appear to be any signs of active infection locally or systemically at this time which is great news. No fevers, chills, nausea, vomiting, or diarrhea. 03-27-2022 upon evaluation today patient appears to be doing somewhat poorly in regard to her legs compared to previous. Her right leg has a couple areas that are open and she is very warm and painful to touch around the lateral portion of the ankle area. Left leg is showing signs of little bit more drainage and weeping as well I think that this is probably due to the fact that she is swelling a lot more that she has been in the past. Fortunately I do not see any signs of active infection systemically though locally there is definitely some issues here. 04-03-2022 upon evaluation today patient appears to be doing poorly in regard to her wounds on the legs unfortunately. She also seems to have more significant cellulitis that has ensued. Unfortunately she has been on doxycycline initially it seemed to be helping but as of this week that is no longer the case. It started getting worse last week and with the reinitiation of the doxycycline nothing has improved. Often when she gets like this IV antibiotics are necessary. 04-19-2022 upon evaluation today patient appears to be doing well currently in regard to her legs from an infection standpoint. She did get IV vancomycin in the hospital and states that her legs did get better when she left she was no longer weeping but she had been in the bed for 4 days straight. With that being said since coming home her legs have begun to weep again the original wound that we were dealing with has healed she does have some weeping over both lower extremities however which is still of concern. 05-03-2022 upon  evaluation today patient appears to be doing well currently in regard to her left leg which I feel like is a little bit better unfortunately the right leg is still draining significantly. Fortunately I do not see any signs of infection locally or systemically at this time. No fevers, chills, nausea, vomiting, or diarrhea. 05-10-2022 unfortunately patient actually showed signs of increasing erythema today. I believe that the cellulitis never completely resolved from when she was in the hospital last. Again she has been on doxycycline previously by myself I gave her Keflex more recently as an ongoing prescription to try to prevent infection but again I do not think this is strong enough to be able to get this under control. I really feel like that she is probably can need IV antibiotics and a good period of time in the hospital to be able to keep her legs elevated and not having to get around to do a whole lot in order to allow this to resolve. The patient voiced understanding. 05-24-2022 upon evaluation today patient appears to be doing excellent in regard to her legs. She has been tolerating the dressing changes without complication. Fortunately there does not appear to be any signs of infection at this time which is great news she has been on cefadroxil since she was discharged from the hospital. The day that we sent her she actually tells me that she ended up in septic shock that day and coded. When she first showed up they did not notice it  on the lab work but said by the end of the day she had already undergone this and they had had to address the issue obviously when she coded. She tells me that likely she would not be here today if she had not gone to the hospital that day. I explained to her that I do believe that was a God thing and it was just the perfect timing that he had for her at that time. Objective Constitutional Obese and well-hydrated in no acute distress. Vitals Time Taken: 10:54  AM, Height: 63 in, Weight: 372 lbs, BMI: 65.9, Temperature: 97.7 F, Pulse: 91 bpm, Respiratory Rate: 18 breaths/min, Blood Pressure: 109/65 mmHg. Respiratory normal breathing without difficulty. Psychiatric this patient is able to make decisions and demonstrates good insight into disease process. Alert and Oriented x 3. pleasant and cooperative. General Notes: Upon inspection patient's legs actually appear to be completely dry which is great news and overall I am extremely pleased with where we stand today. I do not think she is showing any signs of weightbearing and this is great news I am much happier today than last time I saw her 2 weeks ago. Other Condition(s) Patient presents with Lymphedema located on the Right Leg. General Notes: Right leg swelling down, no weeping areas noted Patient presents with Lymphedema located on the Left Leg. 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 Nurse Visit as needed Bathing/ Shower/ Hygiene: Clean wound with Normal Saline or wound cleanser. Wash wounds with antibacterial soap and water. May shower; gently cleanse wound with antibacterial soap, rinse and pat dry prior to dressing wounds No tub bath. Edema Control - Lymphedema / Segmental Compressive Device / Other: Ace wraps Elevate, Exercise Daily and Avoid Standing for Long Periods of Time. Elevate leg(s) parallel to the floor when sitting. DO YOUR BEST to sleep in the bed at night. DO NOT sleep in your recliner. Long hours of sitting in a recliner leads to swelling of the legs and/or potential wounds on  your backside. Other: - use lymphedema pumps Non-Wound Condition: Cleanse affected area with antibacterial soap and water, Apply appropriate compression. - use abdominal pads them ace wrap to secure Additional non-wound orders/instructions: - silver cell to open weeping areas Medications-Please add to medication list.: P.O. Antibiotics - continue cefadroxil as directed 1. Based on what I am seeing I think she actually appears to be healed there is nothing open at this point there is no weeping I am very pleased in that regard. For that reason I am going to recommend that she continue with her ABD pads and the Ace wrap to secure in place in order to help with edema control and to catch anything that may start to drain the right now she seems to be doing quite well. 2. I am going to continue to see her on a semiregular basis to monitor as she tends to turn very quickly from okay to poor to bad extremely fast. I want to make sure that we are keeping things under good control here. The patient voiced understanding. We will see patient back for reevaluation in 2 weeks here in the clinic. If anything worsens or changes patient  will contact our office for additional recommendations. Electronic Signature(s) Signed: 05/25/2022 2:22:40 PM By: Worthy Keeler PA-C Entered By: Worthy Keeler on 05/25/2022 14:22:40 -------------------------------------------------------------------------------- SuperBill Details Patient Name: Date of Service: [name], [name] 05/24/2022 Medical Record Number: [id number] Patient Account Number: [id number] Date of Birth/Sex: Treating RN: 1955-11-21 (66 y.o. [name] Primary Care Provider: Ranae Plumber Other Clinician: Massie Kluver Referring Provider: Treating Provider/Extender: Skeet Simmer in Treatment: 42 Diagnosis Coding ICD-10 Codes Code Description Q82.0 Hereditary lymphedema L97.811 Non-pressure chronic ulcer of other part of  right lower leg limited to breakdown of skin L97.821 Non-pressure chronic ulcer of other part of left lower leg limited to breakdown of skin MARGA, GRAMAJO ([id number]) [id number] Page 14 of [place] Procedures : CPT4 Code: [id number] Description: [id number] - WOUND CARE VISIT-LEV 2 EST PT Modifier: Quantity: 1 Physician Procedures : CPT4 Code Description Modifier [id number] - WC PHYS LEVEL 3 - EST PT ICD-10 Diagnosis Description Q82.0 Hereditary lymphedema L97.811 Non-pressure chronic ulcer of other part of right lower leg limited to breakdown of skin L97.821 Non-pressure  chronic ulcer of other part of left lower leg limited to breakdown of skin Quantity: 1 Electronic Signature(s) Signed: 05/25/2022 2:23:31 PM By: Worthy Keeler PA-C Entered By: Worthy Keeler on 05/25/2022 14:23:31

## 2022-05-24 NOTE — Progress Notes (Addendum)
GOLA, BRIBIESCA ([id number]) [id number] Page 1 of 8 Visit Report for 05/24/2022 Arrival Information Details Patient Name: Date of Service: [name], [name] 05/24/2022 10:30 A M Medical Record Number: [id number] Patient Account Number: [id number] Date of Birth/Sex: Treating RN: [birth year] (66 y.o. Charolette Forward, Kim Primary Care Tiphanie Vo: Ranae Plumber Other Clinician: Massie Kluver Referring Laraya Pestka: Treating Haydn Cush/Extender: Skeet Simmer in Treatment: 42 Visit Information History Since Last Visit All ordered tests and consults were completed: No Patient Arrived: [name] Added or deleted any medications: No Arrival Time: 10:53 Any new allergies or adverse reactions: No Transfer Assistance: None Had a fall or experienced change in No Patient Has Alerts: Yes activities of daily living that may affect Patient Alerts: Borderline Diabetic risk of falls: Signs or symptoms of abuse/neglect since last visito No Hospitalized since last visit: No Implantable device outside of the clinic excluding No cellular tissue based products placed in the center since last visit: Pain Present Now: No Electronic Signature(s) Signed: 05/24/2022 5:06:55 PM By: Massie Kluver Entered By: Massie Kluver on 05/24/2022 11:03:01 -------------------------------------------------------------------------------- Clinic Level of Care Assessment Details Patient Name: Date of Service: [name], [name] 05/24/2022 10:30 A M Medical Record Number: [id number] Patient Account Number: [id number] Date of Birth/Sex: Treating RN: 1956-05-29 (66 y.o. Marlowe Shores Primary Care Damont Balles: Ranae Plumber Other Clinician: Massie Kluver Referring Kalli Greenfield: Treating Emmabelle Fear/Extender: Skeet Simmer in Treatment: 42 Clinic Level of Care Assessment Items TOOL 4 Quantity Score '[]'$  - 0 Use when only an EandM is performed on FOLLOW-UP visit ASSESSMENTS -  Nursing Assessment / Reassessment X- 1 10 Reassessment of Co-morbidities (includes updates in patient status) X- 1 5 Reassessment of Adherence to Treatment Plan DALISHA, [name] ([id number]) [id number] Page 2 of 8 ASSESSMENTS - Wound and Skin A ssessment / Reassessment '[]'$  - Simple Wound Assessment / Reassessment - one wound 0 '[]'$  - 0 Complex Wound Assessment / Reassessment - multiple wounds '[]'$  - 0 Dermatologic / Skin Assessment (not related to wound area) ASSESSMENTS - Focused Assessment '[]'$  - 0 Circumferential Edema Measurements - multi extremities '[]'$  - 0 Nutritional Assessment / Counseling / Intervention '[]'$  - 0 Lower Extremity Assessment (monofilament, tuning fork, pulses) '[]'$  - 0 Peripheral Arterial Disease Assessment (using hand held doppler) ASSESSMENTS - Ostomy and/or Continence Assessment and Care '[]'$  - 0 Incontinence Assessment and Management '[]'$  - 0 Ostomy Care Assessment and Management (repouching, etc.) PROCESS - Coordination of Care X - Simple Patient / Family Education for ongoing care 1 15 '[]'$  - 0 Complex (extensive) Patient / Family Education for ongoing care '[]'$  - 0 Staff obtains Programmer, systems, Records, T Results / Process Orders est '[]'$  - 0 Staff telephones HHA, Nursing Homes / Clarify orders / etc '[]'$  - 0 Routine Transfer to another Facility (non-emergent condition) '[]'$  - 0 Routine Hospital Admission (non-emergent condition) '[]'$  - 0 New Admissions / Biomedical engineer / Ordering NPWT Apligraf, etc. , '[]'$  - 0 Emergency Hospital Admission (emergent condition) X- 1 10 Simple Discharge Coordination '[]'$  - 0 Complex (extensive) Discharge Coordination PROCESS - Special Needs '[]'$  - 0 Pediatric / Minor Patient Management '[]'$  - 0 Isolation Patient Management '[]'$  - 0 Hearing / Language / Visual special needs '[]'$  - 0 Assessment of Community assistance (transportation, D/C planning, etc.) '[]'$  - 0 Additional assistance / Altered mentation '[]'$  -  0 Support Surface(s) Assessment (bed, cushion, seat, etc.) INTERVENTIONS - Wound Cleansing / Measurement '[]'$  - 0 Simple Wound Cleansing - one wound '[]'$  - 0 Complex Wound  Cleansing - multiple wounds '[]'$  - 0 Wound Imaging (photographs - any number of wounds) '[]'$  - 0 Wound Tracing (instead of photographs) '[]'$  - 0 Simple Wound Measurement - one wound '[]'$  - 0 Complex Wound Measurement - multiple wounds INTERVENTIONS - Wound Dressings '[]'$  - 0 Small Wound Dressing one or multiple wounds X- 2 15 Medium Wound Dressing one or multiple wounds '[]'$  - 0 Large Wound Dressing one or multiple wounds '[]'$  - 0 Application of Medications - topical '[]'$  - 0 Application of Medications - injection INTERVENTIONS - Miscellaneous '[]'$  - 0 External ear exam REILLEY, VALENTINE ([id number]) [id number] Page 3 of 8 '[]'$  - 0 Specimen Collection (cultures, biopsies, blood, body fluids, etc.) '[]'$  - 0 Specimen(s) / Culture(s) sent or taken to Lab for analysis '[]'$  - 0 Patient Transfer (multiple staff / Harrel Lemon Lift / Similar devices) '[]'$  - 0 Simple Staple / Suture removal (25 or less) '[]'$  - 0 Complex Staple / Suture removal (26 or more) '[]'$  - 0 Hypo / Hyperglycemic Management (close monitor of Blood Glucose) '[]'$  - 0 Ankle / Brachial Index (ABI) - do not check if billed separately X- 1 5 Vital Signs Has the patient been seen at the hospital within the last three years: Yes Total Score: 75 Level Of Care: New/Established - Level 2 Electronic Signature(s) Signed: 05/24/2022 5:06:55 PM By: Massie Kluver Entered By: Massie Kluver on 05/24/2022 11:11:32 -------------------------------------------------------------------------------- Encounter Discharge Information Details Patient Name: Date of Service: [name]. 05/24/2022 10:30 A M Medical Record Number: [id number] Patient Account Number: [id number] Date of Birth/Sex: Treating RN: 10-Mar-1956 (66 y.o. Charolette Forward, Kim Primary Care Yousuf Ager:  Ranae Plumber Other Clinician: Massie Kluver Referring Nakoma Gotwalt: Treating Lailynn Southgate/Extender: Skeet Simmer in Treatment: 42 Encounter Discharge Information Items Discharge Condition: Stable Ambulatory Status: Walker Discharge Destination: Home Transportation: Private Auto Accompanied By: self Schedule Follow-up Appointment: Yes Clinical Summary of Care: Electronic Signature(s) Signed: 05/24/2022 5:06:55 PM By: Massie Kluver Entered By: Massie Kluver on 05/24/2022 11:24:17 -------------------------------------------------------------------------------- Lower Extremity Assessment Details Patient Name: Date of Service: ANIKKA, MARSAN 05/24/2022 10:30 A MARYANA, PITTMON ([id number]) [id number] Page 4 of 8 Medical Record Number: [id number] Patient Account Number: [id number] Date of Birth/Sex: Treating RN: 12-13-1955 (66 y.o. Marlowe Shores Primary Care Makar Slatter: Ranae Plumber Other Clinician: Massie Kluver Referring Bernon Arviso: Treating Kyonna Frier/Extender: Skeet Simmer in Treatment: 42 Electronic Signature(s) Signed: 05/24/2022 5:06:55 PM By: Massie Kluver Signed: 05/25/2022 7:25:15 AM By: Gretta Cool, BSN, RN, CWS, Kim RN, BSN Entered By: Massie Kluver on 05/24/2022 11:01:39 -------------------------------------------------------------------------------- Multi Wound Chart Details Patient Name: Date of Service: DREYAH, MONTROSE 05/24/2022 10:30 A M Medical Record Number: [id number] Patient Account Number: [id number] Date of Birth/Sex: Treating RN: 11-20-1955 (66 y.o. Marlowe Shores Primary Care Victory Strollo: Ranae Plumber Other Clinician: Massie Kluver Referring Lukasz Rogus: Treating Kaydan Wilhoite/Extender: Skeet Simmer in Treatment: 42 Vital Signs Height(in): 63 Pulse(bpm): 91 Weight(lbs): 372 Blood Pressure(mmHg): 109/65 Body Mass Index(BMI): 65.9 Temperature(F): 97.7 Respiratory  Rate(breaths/min): 18 [Treatment Notes:Wound Assessments Treatment Notes] Electronic Signature(s) Signed: 05/24/2022 5:06:55 PM By: Massie Kluver Entered By: Massie Kluver on 05/24/2022 11:03:20 -------------------------------------------------------------------------------- [place] Details Patient Name: Date of Service: [name]. 05/24/2022 10:30 A M Medical Record Number: [id number] Patient Account Number: [id number] Date of Birth/Sex: Treating RN: [birth year] (66 y.o. Marlowe Shores Primary Care Adalynd Donahoe: Ranae Plumber Other Clinician: Massie Kluver Referring Kenedi Cilia: Treating Temekia Caskey/Extender: Skeet Simmer in Treatment: 57 Active Inactive Hefner, Devota C (  [id number]) [id number] Page 5 of 8 Wound/Skin Impairment Nursing Diagnoses: Impaired tissue integrity Knowledge deficit related to ulceration/compromised skin integrity Goals: Ulcer/skin breakdown will have a volume reduction of 30% by week 4 Date Initiated: 08/01/2021 Target Resolution Date: 08/29/2021 Goal Status: Active Ulcer/skin breakdown will have a volume reduction of 50% by week 8 Date Initiated: 08/01/2021 Target Resolution Date: 09/26/2021 Goal Status: Active Ulcer/skin breakdown will have a volume reduction of 80% by week 12 Date Initiated: 08/01/2021 Target Resolution Date: 10/24/2021 Goal Status: Active Ulcer/skin breakdown will heal within 14 weeks Date Initiated: 08/01/2021 Target Resolution Date: 11/07/2021 Goal Status: Active Interventions: Assess patient/caregiver ability to obtain necessary supplies Assess patient/caregiver ability to perform ulcer/skin care regimen upon admission and as needed Assess ulceration(s) every visit Provide education on ulcer and skin care Notes: Electronic Signature(s) Signed: 05/24/2022 5:06:55 PM By: Massie Kluver Signed: 05/25/2022 7:25:15 AM By: Gretta Cool, BSN, RN, CWS, Kim RN, BSN Entered By:  Massie Kluver on 05/24/2022 11:03:13 -------------------------------------------------------------------------------- Non-Wound Condition Assessment Details Patient Name: Date of Service: [name]. 05/24/2022 10:30 A M Medical Record Number: [id number] Patient Account Number: [id number] Date of Birth/Sex: Treating RN: December 20, 1955 (66 y.o. Marlowe Shores Primary Care Trampus Mcquerry: Ranae Plumber Other Clinician: Massie Kluver Referring Rikki Smestad: Treating Lyam Provencio/Extender: Skeet Simmer in Treatment: 42 Non-Wound Condition: Condition: Lymphedema Location: Leg Side: Right Notes: Right leg with decreased drainage this visit. Notes Right leg swelling down, no weeping areas noted Electronic Signature(s) Signed: 05/24/2022 5:06:55 PM By: Massie Kluver Signed: 05/25/2022 7:25:15 AM By: Gretta Cool, BSN, RN, CWS, Kim RN, BSN Entered By: Massie Kluver on 05/24/2022 11:02:49 TARAN, HABLE ([id number]) [id number] Page 6 of 8 -------------------------------------------------------------------------------- Non-Wound Condition Assessment Details Patient Name: Date of Service: LARESA, OSHIRO 05/24/2022 10:30 A M Medical Record Number: [id number] Patient Account Number: [id number] Date of Birth/Sex: Treating RN: 1956-07-03 (66 y.o. Marlowe Shores Primary Care Madlynn Lundeen: Ranae Plumber Other Clinician: Massie Kluver Referring Demetrio Leighty: Treating Sherena Machorro/Extender: Skeet Simmer in Treatment: 42 Non-Wound Condition: Condition: Lymphedema Location: Leg Side: Left Notes: decreased drainage this visit. Notes left leg swelling down, no weeping areas noted Electronic Signature(s) Signed: 05/24/2022 5:06:55 PM By: Massie Kluver Signed: 05/25/2022 7:25:15 AM By: Gretta Cool, BSN, RN, CWS, Kim RN, BSN Entered By: Massie Kluver on 05/24/2022  11:02:54 -------------------------------------------------------------------------------- Pain Assessment Details Patient Name: Date of Service: BERNETHA, ANSCHUTZ 05/24/2022 10:30 A M Medical Record Number: [id number] Patient Account Number: [id number] Date of Birth/Sex: Treating RN: 1956/06/11 (66 y.o. Charolette Forward, Kim Primary Care Sheetal Lyall: Ranae Plumber Other Clinician: Massie Kluver Referring Siena Poehler: Treating Verlyn Dannenberg/Extender: Skeet Simmer in Treatment: 42 Active Problems Location of Pain Severity and Description of Pain Patient Has Paino No Site Locations MEAGON, DUSKIN ([id number]) [phone].pdf Page 7 of 8 Pain Management and Medication Current Pain Management: Electronic Signature(s) Signed: 05/24/2022 5:06:55 PM By: Massie Kluver Signed: 05/25/2022 7:25:15 AM By: Gretta Cool, BSN, RN, CWS, Kim RN, BSN Entered By: Massie Kluver on 05/24/2022 10:57:09 -------------------------------------------------------------------------------- Patient/Caregiver Education Details Patient Name: Date of Service: [name] 11/16/2023andnbsp10:30 [place] Record Number: [id number] Patient Account Number: [id number] Date of Birth/Gender: Treating RN: [birth year] (66 y.o. Marlowe Shores Primary Care Physician: Ranae Plumber Other Clinician: Massie Kluver Referring Physician: Treating Physician/Extender: Skeet Simmer in Treatment: 73 Education Assessment Education Provided To: Patient Education Topics Provided Wound/Skin Impairment: Handouts: Other: continue wound care as directed Methods: Explain/Verbal Responses: State content correctly Electronic Signature(s) Signed: 05/24/2022 5:06:55 PM By: Clifton James,  Angie Entered By: Massie Kluver on 05/24/2022 11:11:59 [name] ([id number]) [id number] Page 8 of  8 -------------------------------------------------------------------------------- Vitals Details Patient Name: Date of Service: AREYANNA, FIGEROA 05/24/2022 10:30 A M Medical Record Number: [id number] Patient Account Number: [id number] Date of Birth/Sex: Treating RN: [birth year] (66 y.o. Charolette Forward, Kim Primary Care Adina Puzzo: Ranae Plumber Other Clinician: Massie Kluver Referring Ellianne Gowen: Treating Marquett Bertoli/Extender: Skeet Simmer in Treatment: 42 Vital Signs Time Taken: 10:54 Temperature (F): 97.7 Height (in): 63 Pulse (bpm): 91 Weight (lbs): 372 Respiratory Rate (breaths/min): 18 Body Mass Index (BMI): 65.9 Blood Pressure (mmHg): 109/65 Reference Range: 80 - 120 mg / dl Electronic Signature(s) Signed: 05/24/2022 5:06:55 PM By: Massie Kluver Entered By: Massie Kluver on 05/24/2022 10:57:03

## 2022-06-07 ENCOUNTER — Ambulatory Visit: Payer: Medicare HMO | Admitting: Physician Assistant

## 2022-06-14 ENCOUNTER — Encounter: Payer: Medicare HMO | Attending: Physician Assistant | Admitting: Physician Assistant

## 2022-06-14 DIAGNOSIS — L03116 Cellulitis of left lower limb: Secondary | ICD-10-CM | POA: Diagnosis not present

## 2022-06-14 DIAGNOSIS — I110 Hypertensive heart disease with heart failure: Secondary | ICD-10-CM | POA: Insufficient documentation

## 2022-06-14 DIAGNOSIS — K219 Gastro-esophageal reflux disease without esophagitis: Secondary | ICD-10-CM | POA: Insufficient documentation

## 2022-06-14 DIAGNOSIS — I509 Heart failure, unspecified: Secondary | ICD-10-CM | POA: Insufficient documentation

## 2022-06-14 DIAGNOSIS — E11622 Type 2 diabetes mellitus with other skin ulcer: Secondary | ICD-10-CM | POA: Insufficient documentation

## 2022-06-14 DIAGNOSIS — L97821 Non-pressure chronic ulcer of other part of left lower leg limited to breakdown of skin: Secondary | ICD-10-CM | POA: Insufficient documentation

## 2022-06-14 DIAGNOSIS — L97811 Non-pressure chronic ulcer of other part of right lower leg limited to breakdown of skin: Secondary | ICD-10-CM | POA: Diagnosis not present

## 2022-06-14 DIAGNOSIS — I890 Lymphedema, not elsewhere classified: Secondary | ICD-10-CM | POA: Diagnosis not present

## 2022-06-14 DIAGNOSIS — Q820 Hereditary lymphedema: Secondary | ICD-10-CM | POA: Insufficient documentation

## 2022-06-15 NOTE — Progress Notes (Addendum)
[name] ([id number]) [id number] Page 1 of 8 Visit Report for 06/14/2022 Arrival Information Details Patient Name: Date of Service: [name], [name] 06/14/2022 1:30 PM Medical Record Number: [id number] Patient Account Number: [id number] Date of Birth/Sex: Treating RN: 07/29/1955 (66 y.o. Charolette Forward, Kim Primary Care Ellina Sivertsen: Ranae Plumber Other Clinician: Rosalio Loud Referring Alvetta Hidrogo: Treating Jaziah Kwasnik/Extender: Skeet Simmer in Treatment: 28 Visit Information History Since Last Visit All ordered tests and consults were completed: No Patient Arrived: Gilford Rile Added or deleted any medications: No Arrival Time: 13:51 Any new allergies or adverse reactions: No Transfer Assistance: None Had a fall or experienced change in No Patient Identification Verified: Yes activities of daily living that may affect Secondary Verification Process Completed: Yes risk of falls: Patient Requires Transmission-Based Precautions: No Signs or symptoms of abuse/neglect since last visito No Patient Has Alerts: Yes Hospitalized since last visit: No Patient Alerts: Borderline Diabetic Implantable device outside of the clinic excluding No cellular tissue based products placed in the center since last visit: Has Dressing in Place as Prescribed: Yes Pain Present Now: Yes Electronic Signature(s) Signed: 06/15/2022 10:21:17 AM By: Massie Kluver Entered By: Massie Kluver on 06/14/2022 14:09:56 -------------------------------------------------------------------------------- Clinic Level of Care Assessment Details Patient Name: Date of Service: [name] 06/14/2022 1:30 PM Medical Record Number: [id number] Patient Account Number: [id number] Date of Birth/Sex: Treating RN: Aug 30, 1955 (66 y.o. Marlowe Shores Primary Care Melvena Vink: Ranae Plumber Other Clinician: Rosalio Loud Referring Isadora Delorey: Treating Rees Matura/Extender: Skeet Simmer in Treatment: 45 Clinic Level of Care Assessment Items TOOL 4 Quantity Score '[]'$  - 0 Use when only an EandM is performed on FOLLOW-UP visit ASSESSMENTS - Nursing Assessment / Reassessment X- 1 10 Reassessment of Co-morbidities (includes updates in patient status) X- 1 5 Reassessment of Adherence to Treatment Plan [name], [name] ([id number]) [id number] Page 2 of 8 ASSESSMENTS - Wound and Skin A ssessment / Reassessment '[]'$  - 0 Simple Wound Assessment / Reassessment - one wound '[]'$  - 0 Complex Wound Assessment / Reassessment - multiple wounds '[]'$  - 0 Dermatologic / Skin Assessment (not related to wound area) ASSESSMENTS - Focused Assessment '[]'$  - 0 Circumferential Edema Measurements - multi extremities '[]'$  - 0 Nutritional Assessment / Counseling / Intervention '[]'$  - 0 Lower Extremity Assessment (monofilament, tuning fork, pulses) '[]'$  - 0 Peripheral Arterial Disease Assessment (using hand held doppler) ASSESSMENTS - Ostomy and/or Continence Assessment and Care '[]'$  - 0 Incontinence Assessment and Management '[]'$  - 0 Ostomy Care Assessment and Management (repouching, etc.) PROCESS - Coordination of Care X - Simple Patient / Family Education for ongoing care 1 15 '[]'$  - 0 Complex (extensive) Patient / Family Education for ongoing care '[]'$  - 0 Staff obtains Programmer, systems, Records, T Results / Process Orders est '[]'$  - 0 Staff telephones HHA, Nursing Homes / Clarify orders / etc '[]'$  - 0 Routine Transfer to another Facility (non-emergent condition) '[]'$  - 0 Routine Hospital Admission (non-emergent condition) '[]'$  - 0 New Admissions / Biomedical engineer / Ordering NPWT Apligraf, etc. , '[]'$  - 0 Emergency Hospital Admission (emergent condition) X- 1 10 Simple Discharge Coordination '[]'$  - 0 Complex (extensive) Discharge Coordination PROCESS - Special Needs '[]'$  - 0 Pediatric / Minor Patient Management '[]'$  - 0 Isolation Patient Management '[]'$  - 0 Hearing /  Language / Visual special needs '[]'$  - 0 Assessment of Community assistance (transportation, D/C planning, etc.) '[]'$  - 0 Additional assistance / Altered mentation '[]'$  - 0 Support Surface(s) Assessment (bed, cushion, seat, etc.) INTERVENTIONS -  Wound Cleansing / Measurement '[]'$  - 0 Simple Wound Cleansing - one wound '[]'$  - 0 Complex Wound Cleansing - multiple wounds '[]'$  - 0 Wound Imaging (photographs - any number of wounds) '[]'$  - 0 Wound Tracing (instead of photographs) '[]'$  - 0 Simple Wound Measurement - one wound '[]'$  - 0 Complex Wound Measurement - multiple wounds INTERVENTIONS - Wound Dressings '[]'$  - 0 Small Wound Dressing one or multiple wounds X- 2 15 Medium Wound Dressing one or multiple wounds '[]'$  - 0 Large Wound Dressing one or multiple wounds '[]'$  - 0 Application of Medications - topical '[]'$  - 0 Application of Medications - injection INTERVENTIONS - Miscellaneous '[]'$  - 0 External ear exam GER, NICKS ([id number]) [id number] Page 3 of 8 '[]'$  - 0 Specimen Collection (cultures, biopsies, blood, body fluids, etc.) '[]'$  - 0 Specimen(s) / Culture(s) sent or taken to Lab for analysis '[]'$  - 0 Patient Transfer (multiple staff / Harrel Lemon Lift / Similar devices) '[]'$  - 0 Simple Staple / Suture removal (25 or less) '[]'$  - 0 Complex Staple / Suture removal (26 or more) '[]'$  - 0 Hypo / Hyperglycemic Management (close monitor of Blood Glucose) '[]'$  - 0 Ankle / Brachial Index (ABI) - do not check if billed separately X- 1 5 Vital Signs Has the patient been seen at the hospital within the last three years: Yes Total Score: 75 Level Of Care: New/Established - Level 2 Electronic Signature(s) Signed: 06/15/2022 10:21:17 AM By: Massie Kluver Entered By: Massie Kluver on 06/14/2022 14:29:09 -------------------------------------------------------------------------------- Encounter Discharge Information Details Patient Name: Date of Service: [name] Matte 06/14/2022 1:30  PM Medical Record Number: [id number] Patient Account Number: [id number] Date of Birth/Sex: Treating RN: Nov 17, 1955 (66 y.o. Charolette Forward, Kim Primary Care Johnny Latu: Ranae Plumber Other Clinician: Rosalio Loud Referring Zeppelin Beckstrand: Treating Adysson Revelle/Extender: Skeet Simmer in Treatment: [id number] Encounter Discharge Information Items Discharge Condition: Stable Ambulatory Status: Walker Discharge Destination: Home Transportation: Other Accompanied By: self Schedule Follow-up Appointment: Yes Clinical Summary of Care: Electronic Signature(s) Signed: 06/15/2022 10:21:17 AM By: Massie Kluver Entered By: Massie Kluver on 06/14/2022 16:31:44 Lower Extremity Assessment Details -------------------------------------------------------------------------------- [name] ([id number]) [id number] Page 4 of 8 Patient Name: Date of Service: [name], ROBBS 06/14/2022 1:30 PM Medical Record Number: [id number] Patient Account Number: [id number] Date of Birth/Sex: Treating RN: 17-Sep-1955 (66 y.o. Marlowe Shores Primary Care Angely Dietz: Ranae Plumber Other Clinician: Rosalio Loud Referring Madlyn Crosby: Treating Kamron Portee/Extender: Skeet Simmer in Treatment: [phone] Vascular Assessment Left: Right: Pulses: Dorsalis Pedis Palpable: Yes Yes Electronic Signature(s) Signed: 06/15/2022 10:21:17 AM By: Massie Kluver Signed: 06/15/2022 11:32:10 AM By: Gretta Cool, BSN, RN, CWS, Kim RN, BSN Entered By: Massie Kluver on 06/14/2022 14:10:07 -------------------------------------------------------------------------------- Multi Wound Chart Details Patient Name: Date of Service: NEERA, TENG 06/14/2022 1:30 PM Medical Record Number: [id number] Patient Account Number: [id number] Date of Birth/Sex: Treating RN: November 05, 1955 (65 y.o. Marlowe Shores Primary Care [name] [name]: Ranae Plumber Other Clinician: Rosalio Loud Referring Ryllie Nieland: Treating  Luverta Korte/Extender: Skeet Simmer in Treatment: 45 Vital Signs Height(in): 63 Pulse(bpm): 101 Weight(lbs): 372 Blood Pressure(mmHg): 144/84 Body Mass Index(BMI): 65.9 Temperature(F): 97.8 Respiratory Rate(breaths/min): 18 [Treatment Notes:Wound Assessments Treatment Notes] Electronic Signature(s) Signed: 06/15/2022 10:21:17 AM By: Massie Kluver Entered By: Massie Kluver on 06/14/2022 14:10:13 -------------------------------------------------------------------------------- Multi-Disciplinary Care Plan Details Patient Name: Date of Service: [name] Matte 06/14/2022 1:30 PM Medical Record Number: [id number] Patient Account Number: [id number] PRAPTI, GRUSSING ([id number]) [id number] Page 5 of 8 Date of Birth/Sex: Treating RN:  13-Nov-1955 (66 y.o. Marlowe Shores Primary Care Melvin Marmo: Other Clinician: Richmond Campbell Referring Makhia Vosler: Treating Maleki Hippe/Extender: Skeet Simmer in Treatment: 45 Active Inactive Wound/Skin Impairment Nursing Diagnoses: Impaired tissue integrity Knowledge deficit related to ulceration/compromised skin integrity Goals: Ulcer/skin breakdown will have a volume reduction of 30% by week 4 Date Initiated: 08/01/2021 Target Resolution Date: 08/29/2021 Goal Status: Active Ulcer/skin breakdown will have a volume reduction of 50% by week 8 Date Initiated: 08/01/2021 Target Resolution Date: 09/26/2021 Goal Status: Active Ulcer/skin breakdown will have a volume reduction of 80% by week 12 Date Initiated: 08/01/2021 Target Resolution Date: 10/24/2021 Goal Status: Active Ulcer/skin breakdown will heal within 14 weeks Date Initiated: 08/01/2021 Target Resolution Date: 11/07/2021 Goal Status: Active Interventions: Assess patient/caregiver ability to obtain necessary supplies Assess patient/caregiver ability to perform ulcer/skin care regimen upon admission and as needed Assess  ulceration(s) every visit Provide education on ulcer and skin care Notes: Electronic Signature(s) Signed: 06/15/2022 10:21:17 AM By: Massie Kluver Signed: 06/15/2022 11:32:10 AM By: Gretta Cool, BSN, RN, CWS, Kim RN, BSN Entered By: Massie Kluver on 06/14/2022 16:31:09 -------------------------------------------------------------------------------- Non-Wound Condition Assessment Details Patient Name: Date of Service: LOLETA, FROMMELT 06/14/2022 1:30 PM Medical Record Number: [id number] Patient Account Number: [id number] Date of Birth/Sex: Treating RN: [birth year] (66 y.o. Marlowe Shores Primary Care Johnney Scarlata: Ranae Plumber Other Clinician: Rosalio Loud Referring Zacharia Sowles: Treating Buster Schueller/Extender: Skeet Simmer in Treatment: 36 Non-Wound Condition: Condition: Lymphedema Location: Leg Side: Right Notes: Right leg with decreased drainage this visit. Notes swelling down, slight drainage noted to posterior lower leg SHERRIANN, SZUCH ([id number]) [id number] Page 6 of 8 Electronic Signature(s) Signed: 06/15/2022 10:21:17 AM By: Massie Kluver Signed: 06/15/2022 11:32:10 AM By: Gretta Cool, BSN, RN, CWS, Kim RN, BSN Entered By: Massie Kluver on 06/14/2022 14:09:41 -------------------------------------------------------------------------------- Non-Wound Condition Assessment Details Patient Name: Date of Service: SHON, MANSOURI 06/14/2022 1:30 PM Medical Record Number: [id number] Patient Account Number: [id number] Date of Birth/Sex: Treating RN: 07/05/1956 (66 y.o. Marlowe Shores Primary Care Illana Nolting: Ranae Plumber Other Clinician: Rosalio Loud Referring Jermari Tamargo: Treating Lidiya Reise/Extender: Skeet Simmer in Treatment: 49 Non-Wound Condition: Condition: Lymphedema Location: Leg Side: Left Notes: decreased drainage this visit. Notes swelling down, no drainage noted Electronic Signature(s) Signed: 06/15/2022 10:21:17 AM  By: Massie Kluver Signed: 06/15/2022 11:32:10 AM By: Gretta Cool, BSN, RN, CWS, Kim RN, BSN Entered By: Massie Kluver on 06/14/2022 14:09:46 -------------------------------------------------------------------------------- Pain Assessment Details Patient Name: Date of Service: [name], GOSWAMI 06/14/2022 1:30 PM Medical Record Number: [id number] Patient Account Number: [id number] Date of Birth/Sex: Treating RN: 07/30/1955 (66 y.o. Charolette Forward, Kim Primary Care Torin Modica: Ranae Plumber Other Clinician: Rosalio Loud Referring Ani Deoliveira: Treating Caoilainn Sacks/Extender: Skeet Simmer in Treatment: 45 Active Problems Location of Pain Severity and Description of Pain Patient Has Paino Yes Site Locations Pain Location: CORALINE, TALWAR ([id number]) [id number] Page 7 of 8 Pain Location: Generalized Pain, Pain in Ulcers Duration of the Pain. Constant / Intermittento Constant Rate the pain. Current Pain Level: 4 Character of Pain Describe the Pain: Aching Pain Management and Medication Current Pain Management: Medication: Yes Cold Application: No Rest: Yes Massage: No Activity: No T.E.N.S.: No Heat Application: No Leg drop or elevation: No Is the Current Pain Management Adequate: Inadequate How does your wound impact your activities of daily livingo Sleep: No Bathing: No Appetite: No Relationship With Others: No Bladder Continence: No Emotions: No Bowel Continence: No Work: No Toileting: No Drive: No Dressing: No Hobbies: No Electronic  Signature(s) Signed: 06/15/2022 10:21:17 AM By: Massie Kluver Signed: 06/15/2022 11:32:10 AM By: Gretta Cool, BSN, RN, CWS, Kim RN, BSN Entered By: Massie Kluver on 06/14/2022 13:58:25 -------------------------------------------------------------------------------- Patient/Caregiver Education Details Patient Name: Date of Service: [name] Matte 12/7/2023andnbsp1:30 PM Medical Record Number:  [id number] Patient Account Number: [id number] Date of Birth/Gender: Treating RN: [birth year] (66 y.o. Marlowe Shores Primary Care Physician: Ranae Plumber Other Clinician: Rosalio Loud Referring Physician: Treating Physician/Extender: Skeet Simmer in Treatment: 51 Education Assessment Education Provided To: Patient Education Topics Provided Wound/Skin Impairment: Handouts: Other: continue wound care as directed CELESTA, FUNDERBURK ([id number]) [id number] Page 8 of 8 Methods: Explain/Verbal Responses: State content correctly Electronic Signature(s) Signed: 06/15/2022 10:21:17 AM By: Massie Kluver Entered By: Massie Kluver on 06/14/2022 16:31:03 -------------------------------------------------------------------------------- Vitals Details Patient Name: Date of Service: [name] Matte. 06/14/2022 1:30 PM Medical Record Number: [id number] Patient Account Number: [id number] Date of Birth/Sex: Treating RN: [birth year] (66 y.o. Charolette Forward, Kim Primary Care Harlis Champoux: Ranae Plumber Other Clinician: Rosalio Loud Referring Corby Villasenor: Treating Peytan Andringa/Extender: Skeet Simmer in Treatment: 45 Vital Signs Time Taken: 13:56 Temperature (F): 97.8 Height (in): 63 Pulse (bpm): 101 Weight (lbs): 372 Respiratory Rate (breaths/min): 18 Body Mass Index (BMI): 65.9 Blood Pressure (mmHg): 144/84 Reference Range: 80 - 120 mg / dl Electronic Signature(s) Signed: 06/15/2022 10:21:17 AM By: Massie Kluver Entered By: Massie Kluver on 06/14/2022 13:58:20

## 2022-06-15 NOTE — Progress Notes (Addendum)
ELANE, PEABODY ([id number]) [id number] Page 1 of 14 Visit Report for 06/14/2022 Chief Complaint Document Details Patient Name: Date of Service: [name], [name] 06/14/2022 1:30 PM Medical Record Number: [id number] Patient Account Number: [id number] Date of Birth/Sex: Treating RN: [birth year] (66 y.o. [place] Primary Care Provider: Ranae Plumber Other Clinician: Rosalio Loud Referring Provider: Treating Provider/Extender: Skeet Simmer in Treatment: 57 Information Obtained from: Patient Chief Complaint Bilateral LE lymphedema with Left LE Ulcers Electronic Signature(s) Signed: 06/14/2022 1:44:32 PM By: Worthy Keeler PA-C Entered By: Worthy Keeler on 06/14/2022 13:44:32 -------------------------------------------------------------------------------- HPI Details Patient Name: Date of Service: [name], [name] 06/14/2022 1:30 PM Medical Record Number: [id number] Patient Account Number: [id number] Date of Birth/Sex: Treating RN: 1956-07-08 (66 y.o. [place] Primary Care Provider: Ranae Plumber Other Clinician: Rosalio Loud Referring Provider: Treating Provider/Extender: Skeet Simmer in Treatment: 58 History of Present Illness HPI Description: The patient is a 66 year old female with history of hypertension and a long-standing history of bilateral lower extremity lymphedema (first presented on 4/2) . She has had open ulcers in the past which have always responded to compression therapy. She had briefly been to a lymphedema clinic in the past which helped her at the time. this time around she stopped treatment of her lymphedema pumps approximately 2 weeks ago because of some pain in the knees and then noticed the right leg getting worse. She was seen by her PCP who put her on clindamycin 4 times a day 2 days ago. The patient has seen [facility] and Dr. Delana Meyer had seen her last year where a vascular study  including venous and arterial duplex studies were within normal limits. he had recommended compression stockings and lymphedema pumps and the patient has been using this in about 2 weeks ago. She is known to be diabetic but in the past few time she's gone to her primary care doctor her hemoglobin A1c has been normal. 02/11/2015 - after her last visit she took my advice and went to the ER regarding the progressive cellulitis of her right lower extremity and she was admitted between July 17 and 22nd. She received IV antibiotics and then was sent home on a course of steroid-induced and oral antibiotics. She has improved much since then. 02/17/2015 -- she has been doing fine and the weeping of her legs has remarkably gone down. She has no fresh issues. [name], [name] ([id number]) [id number] Page 2 of 14 01/15/18 This patient was given this clinic before most recently in 2016 seen by Dr. Con Memos. She has massive bilateral lymphedema and over the last 2 months this had weeping edema out of the left leg. She has compression pumps but her compliance with these has been minimal. She has [facility] they've been using TCA/ABDs/kerlix under an Ace wrap.she has had recent problems with cellulitis. She was apparently seen in the ER and 12/23/17 and given clindamycin. She was then followed by her primary doctor and given doxycycline and Keflex. The pain seems to have settled down. In April 2018 the patient had arterial studies done at [facility]. This showed triphasic waveforms throughout the right leg and mostly triphasic waveforms on the left except for monophasic at the posterior tibial artery distally. She was not felt to have evidence of right lower extremity arterial stenosis or significant problems on the left side. 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 She also had a right lower extremity  venous Doppler in January  2018 this was limited by the patient's body habitus and lymphedema. Most of the proximal veins were not visualized The patient presents with an area of denuded skin on the anterior medial part of the left calf. There is weeping edema fluid here. 01/22/18; the patient has somewhat better edema control using her compression pumps twice a day and as a result she has much better epithelialization on the left anterior calf area. Only a small open area remains. 01/29/18; the patient has been compliant with her compression pumps. Both the areas on her calf that healed. The remaining area on the left anterior leg is fully epithelialized Readmission: 02/20/2019 upon evaluation today patient presents for reevaluation due to issues that she is having with the bilateral lower extremities. She actually has wounds open on both legs. On the right she has an area in the crease of her leg on the right around the knee region which is actually draining quite a bit and actually has some fungal type appearance to it. She has been on nystatin powder that seems to have helped to some degree. In regard to the left lower extremity this is actually in the lower portion of her leg closer to the ankle and again is continuing to drain as well unfortunately. There does not appear to be any signs of active infection at this time which is good news. No fevers, chills, nausea, vomiting, or diarrhea. She tells me that since she was seen last year she is actually been doing quite well for the most part with regard to her lower extremities. Unfortunately she now is experiencing a little bit more drainage at this time. She is concerned about getting this under control so that it does not get significantly worse. 02/27/2019 on evaluation today patient appears to be doing somewhat better in regard to her bilateral lower extremity wounds. She has been tolerating the dressing changes without complication. Fortunately there is no signs of active  infection at this point. No fevers, chills, nausea, vomiting, or diarrhea. She did get her dressing supplies which is excellent news she was extremely excited to get these. She also got paperwork from [employer] for their financial assistance program where they may be able to help her out in the future if needed with supplies at discounted prices. 03/06/2019 on evaluation today patient appears to be doing a little worse with regard to both areas of weeping on her bilateral lower extremities. This is around the right medial knee and just above the left ankle. With that being said she is unfortunately not doing as well as I would like to see. I feel like she may need to potentially go see someone at the lymphedema clinic as the wraps that she needs or even beyond what we can do here at the wound care center. She really does not have wounds she just has open areas of weeping that are causing some difficulty for her. Subsequently because of this and the moisture I am concerned about the potential for infection I am going to likely give her a prophylactic antibiotic today, Keflex, just to be on the safe side. Nonetheless again there is no obvious signs of active infection at this time. 03/13/2019 on evaluation today patient appears to be doing well with regard to her bilateral lower extremities where she has been weeping compared to even last week's evaluation. I see some areas of new skin growth which is excellent and overall I am very pleased with how things  seem to be progressing. No fevers, chills, nausea, vomiting, or diarrhea. 03/20/2019 on evaluation today patient unfortunately is continuing to have issues with significant edema of the left lower extremity. Her right side seems to be doing much better. Unfortunately her left side is showing increased weeping of the lower portion of her leg. This is quite unfortunate obviously we were hoping to get her into the lymphedema clinic they really do not seem to when  I see her how if she is draining. Despite the fact this is really not wound related but more lymphedema weeping related. Nonetheless I do not know that this can be helpful for her to even go for that appointment since again I am not sure there is much that they would actually do at this point. We may need to try a 4 layer compression wrap as best we can on her leg. She is on the Augmentin currently although I am still concerned about whether or not there could be potentially something going on infection wise I would obtain a culture though I understand is not the best being that is a surface culture I just 1 to make sure I do not seem to be missing anything. 03/27/2019 on evaluation today patient appears to be doing much better in regard to the left lower extremity compared to last week. Last week she had tremendous weeping which I think was subsequent to infection now she seems to be doing much better and very pleased. This is not completely healed but there is a lot of new skin growth and it has dried out quite a bit. Overall I think that we are doing well with how things are moving along at this time. No fevers, chills, nausea, vomiting, or diarrhea. 04/03/2019 evaluation today patient appears to be doing a little worse this week compared to last time I saw her. I think this may be due to the fact that she is having issues with not being able to sleep in her bed at least not until last night. She is therefore been in a lift chair and subsequently has also had issues with not been able to use her pumps since she could not get in bed. With that being said the patient overall seems to be doing okay I do think I may want extend the antibiotic for a little bit longer at least until we can see if her edema and her weeping gets better and if it is then obviously I can always discontinue the antibiotics as of next week however I want her to continue to have it over the next week. 04/10/2019 on evaluation today  patient unfortunately is still doing poorly with regard to her left lower extremity. Her right is all things considering doing fairly well. On the left however she continues to have spreading of the area of infection and weeping which appears to be even a larger surface area than noted last week. She did have a positive culture for Pseudomonas in particular which seems to have been of concern she still has green/yellow discharge consistent with Pseudomonas and subsequently a tremendous amount of it. This has me obviously still concerned about the infection not really clearing up despite the fact that on culture it appears the Cipro should have been a good option for treating this. I think she may at this point need IV antibiotics since things are not doing better I do not want to get worse and cause sepsis. She is in agreement with the plan and  believes as well that she likely does need to go to the hospital for IV vancomycin. Or something of the like depending on what the recommendation is from the ER. 04/17/2019 on evaluation today patient appears to be doing excellent in regard to her lower extremity on the left. She was in the hospital for several days from when I sent her last we saw her until just this past Tuesday. Fortunately her drainage is significantly improved and in fact is mostly clear. There is just a couple small areas that may still drain a little bit she states that the [facility] they prescribed for her at discharge she went picked up from pharmacy and got home but has not been able to find it since. She is looked everywhere. She is wondering if I will replace that for her today I will be more than happy to do that. 05/01/2019 on evaluation today patient actually appears to be doing quite well with regard to her lower extremities. She occasionally is having areas that will leak and then heal up mainly when a piece of the fibrotic skin pops off but fortunately she is not having any signs of  active infection at this time. Overall she also really does not have any obvious weeping at this time. I do believe however she really needs some compression wraps and I think this may be a good time to get her back to the lymphedema clinic. 05/11/2019 on evaluation today patient actually appears to be doing quite well with regard to her bilateral lower extremities. She occasionally will have a small area that we per another but in general seems to be completely healed which is great news. Overall very pleased with how everything seems to be progressing. She does have her appointment with lymphedema clinic on November 18. 05/25/2019 on evaluation today patient appears to be doing well with regard to her left lower extremity. I am very pleased in this regard. In regard to her right leg this actually did start draining more I think it is mainly due to the fact that her leg is more swollen. I am not seeing any obvious signs of infection at this time although that is definitely something were obviously acutely aware of simply due to the fact that she had an issue not too far back with exactly this issue. Nonetheless I do feel like that lymphedema clinic would still be beneficial for her. I explained obviously if they are not able to do anything treatment wise on the right leg we could at least have them treat her left leg and then proceed from there. The patient is really in agreement with that plan. If they are able to do both as the drainage slows down that I would be happy to let them handle both. [name], [name] ([id number]) [id number] Page 3 of 14 06/01/2019 on evaluation today patient unfortunately appears to be doing worse with regard to her right lower extremity. The left lower extremity is still maintaining at this point. Unfortunately she has been having significantly increased pain over the past several days and has been experiencing as well increased swelling of the  right lower extremity. I really do not know that I am seeing anything that appears to be obvious for infection at this point to be peripherally honest. With that being said the patient does seem to be having much more swelling that she is even experienced in the past and coupled with increased pain in her hip as well I am concerned that  again she could potentially have a DVT although I am not 100% sure of this. I think it something that may need to be checked out. We discussed the possibility of sending her for a DVT study through the hospital but unfortunately transportation is an issue if she does have a DVT I do not want her to wait days to be able to get in for that test however if she has this scheduled as an outpatient that is as fast that she will be able to get the test scheduled for transportation purposes. That will also fall on Thanksgiving so subsequently she did actually be looking at either Friday or even next week before we would know anything back from this. That is much too long in my opinion. Subsequent to the amount of discomfort she is experiencing the patient is actually okay with going to the ER for evaluation today. 06/12/2019 on evaluation today patient actually appears to be doing significantly better compared to last time I saw her. Following when I last saw her she was actually in the hospital from that Monday until the following Sunday almost 1 full week. She actually was placed on Keflex in the hospital following the time for her to be discharged and Dr. Steva Ready has recommended 2 times a day dosing of the Keflex for the next year in order to help with more prophylactic/preventative measures with regard to her developing cellulitis. Overall I think this sounds like an excellent plan. The patient unfortunately is good to have trouble being treated at lymphedema clinic due to the fact that she really cannot get up on the bed that they have there. They also state that  they cannot manage her as long as she has anything draining at this point. Obviously that is somewhat unfortunate as she does need help with edema control but nonetheless we will have to do what we can for her outside of it sounds like the lymphedema clinic scenario at this point. 06/19/2019 on evaluation today patient appears to be doing fairly well with regard to her bilateral lower extremities. She is not nearly as swollen and shows no signs of infection at this point. There is no evidence of cellulitis whatsoever. She also has no open wounds or draining at this point which is also good news. No fever chills noted. She seems to be in very good spirits and in fact appears to be doing quite well. READMISSION 11/27/2019 This is a 66 year old woman that we have had in this clinic several times before including 2015, 16 and 19 and then most recently from 03/20/2019 through 06/19/2019 with bilateral lower extremity lymphedema. She has had previous arterial and reflux studies done years ago which were not all that remarkable. 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 She does have a positive family history and she had large legs starting may be in her 45s. She was recently in hospital from 10/20/2019 through 10/28/2019 with right leg cellulitis. She was given Ancef and clindamycin and then Zosyn when a culture showed Pseudomonas. At that time there was purulent drainage. She was followed by infectious disease Dr Steva Ready. The patient is now back at home. She has noted increased swelling in the right and no drainage in her right leg mostly on the posterior medial aspect in the calf area. She has not had pain or fever. She has literally been improved lysing above dressings because her at the area of this is far too large  for standard compression. She has been wrapping the areas with sheets to resorptive pads. She is found these helped somewhat. She does  have an appointment with the lymphedema clinic in [place] in late June. Past medical history includes bilateral lymphedema, hypertension, obstructive sleep apnea with CPAP. Recent hospitalization with apparently Pseudomonas cellulitis of the right lower leg 12/15/2019 upon evaluation today patient appears to be doing a little bit worse in regard to her right lower extremity. Unfortunately she is having more weeping down in the lower portion of her leg. Fortunately there is no signs of active infection at this time. No fever chills noted. The patient states she is not having increased pain except for when she attempted to use the lymphedema pumps unfortunately she states that she did have pain when she did this. Otherwise we been using absorptive dressings of one type or another she is using diapers at home and then subsequently Ace wraps. In regard to the barrier cream we have discussed the possibility of derma cloud which she would like to try I do not have a problem with that. 12/22/2019 upon evaluation today patient actually appears to be doing better in regard to her leg ulcers at this point. Fortunately there does not appear to be any signs of active infection which is great news and I am extremely pleased with where things are progressing at this time. There is no sign of active infection currently. The patient is very pleased to see things doing so well. 12/29/2019 upon evaluation today patient appears to be doing a little bit better in regard to her weeping in general over her lower extremities. She does have some signs of mild erythema little bit more than what I noted last week or rather last visit. Nonetheless I think that my threshold for switching her antibiotics from Keflex to something else is very low at this point considering that she has had such severe infections in the past that seem to come almost out of nowhere. There is a little erythema and warmth noted of the lower portion of  her leg compared to the upper which also makes me want to go ahead and address things more rapidly at this point. Likely I would switch out the Keflex for something like Levaquin ideally. 7/16; patient with severe bilateral lymphedema. She has superficial wounds albeit almost circumferential now on the left lateral lower leg. This may be new from last time. Small area on the right anterior lower leg and then another area on the right medial lower leg and of pannus fold. She has been using various absorptive garments. She states she is using her compression pumps once a day occasionally twice. Culture from her last visit here was negative 01/29/2020 on evaluation today patient appears to be doing excellent at this point in regard to her legs with regard to infection I see no signs of active infection at this point. She still does have unfortunately areas of weeping this is minimal on the right now her left is actually significantly worse although I do not think it is as bad as last week with Dr. Dellia Nims saw her. She has been trying to pump and elevate her legs is much as possible. She has previously been on the Keflex and in the past for prevention that seems to do fairly well and likely can extend that today. 02/04/2020 on evaluation today patient appears to be doing better in regard to her legs bilaterally. Fortunately there is no signs of active infection  at this time which is great news and overall she has less weeping on the left compared to the right and there is several spots where she is pretty much sealed up with no draining regions. Overall very pleased in this regard. 02/19/2020 on evaluation today patient appears to be doing very well in regard to her wounds currently. Fortunately there is no evidence of active infection overall very pleased with where things stand. She is significantly improved in regard to her edema I am extremely pleased in this regard she tells me that the popping no longer  hurts and in fact she actually looks forward to it. 03/04/2020 on evaluation today patient appears to be doing excellent in regard to her lower extremities. Fortunately there is no signs of active infection at this time. No fevers, chills, nausea, vomiting, or diarrhea. 03/25/2020 on evaluation today patient appears to be doing a little bit more poorly in regard to her legs at this point. She tells me that she is still continue to have issues with drainage and this has been a little bit worse she was getting ready to start taking the Keflex again but wanted to see me first. Fortunately there is no signs of active infection at this time. No fevers, chills, nausea, vomiting, or diarrhea. 04/15/2020 upon evaluation today patient appears to be doing somewhat poorly in regard to her right leg. She tells me she has been having more pain she has been taking the Keflex that was previously prescribed unfortunately that just does not seem to help with this. She was hoping that the pain on her right was actually coming from the fact that she was having issues with her wrap having gotten caught in her recliner. With that being said she tells me that she knew something was not right. Currently her right leg is warm to touch along with being erythematous all the way up to around at least mid thigh as far as I can see. The left leg does not appear to be doing that badly though there is increased weeping around the ankle region. 05/06/2020 on evaluation today patient appears to be doing much better than last time I saw her. She did go to the hospital where she was admitted for 2 days and treated with antibiotic therapy. She was discharged with antibiotics as well and has done extremely well. I am extremely pleased with where things stand today. There is no signs of active infection at this time which is great news. 05/27/2020 upon evaluation today patient appears to be doing well with regard to her lower extremities  bilaterally. She has just a very tiny area on the right leg which is opening on the left leg she is significantly improved though she still has several areas that do appear to be open this is minimal compared to what is been in the past. In general I am extremely pleased with where things stand today. The patient does tell me she is not been using her pumps quite as much as TARISHA, FADER ([id number]) [id number] Page 4 of 14 she should be. I do believe that is 1 area she can definitely work on. She has had a lot going on including a Covid exposure and apparently also a outbreak of likely shingles. 06/24/2020 upon evaluation today patient appears to be doing well with regard to her legs in general although the left leg unfortunately is showing some signs of erythema she does have a little bit of increased weeping and to be  honest I am concerned about infection here. I discussed that with her today and I think that we may need to address this sooner rather than later she has been taking Keflex she is not really certain that is been making a big improvement however. No fevers, chills, nausea, vomiting, or diarrhea. 06/30/2020 upon evaluation today patient's legs actually seem to be doing better in my opinion as compared to where they were last week. Fortunately there does not appear to be any signs of active infection. Her culture showed multiple organisms nothing predominate. With that being said the Levaquin seems to have done well I think she has improved since I last saw her as well. 07/14/2020 upon evaluation today patient appears to be doing actually better in regard to her lower extremities in my opinion. She has been tolerating the dressing changes without complication. Fortunately there is no signs of active infection at this time. 08/04/2020 upon evaluation today patient appears to be doing excellent in regard to her leg ulcers. Fortunately she has very little that is  open at this point. This is great news. In fact I think that the Goldbond medicated powder has been excellent for her. It seems to have done the trick where we had tried several other things without as much success. Fortunately there is no evidence of active infection at this time. No fevers, chills, nausea, vomiting, or diarrhea. 09/01/2020 upon evaluation today patient appears to be doing a little bit more poorly than the last time I saw her. She tells me right now that she has been having a lot of drainage compared to where things were previous which has unfortunately led to more irritation as well. She is concerned that this is leading to infection. Again based on what I am seeing today as well I am also concerned of the same to be honest. 09/15/2020 upon evaluation today patient appears to be doing well with regard to her legs compared to what I saw previous. Fortunately there does not appear to be any evidence of active infection at this time which is great news. At least not as badly as what it was previous. With that being said I do believe that the patient does need to have an extension of the antibiotics. This I believe will actually help her more in the way of making sure this stays under control and does not worsen. That was the Bactrim DS. Readmission: 08/01/2021 upon evaluation today patient appears to be doing actually pretty well all things considered. Is actually been almost a year since have seen her last March. Fortunately I do not see any evidence of active infection locally nor systemically at this point. She does have a couple open areas on the left leg the right leg appears to be doing quite well. In general she has been in the hospital 3 times since I saw her a year ago all 4 cellulitis type issues except for the last 1 which was actually more related to congestive heart failure for that reason she is not using her lymphedema pumps at this point and that is probably the  best idea. 08/08/2021 upon evaluation today patient appears to be doing well with regard to her legs all things considered her left leg may be a little bit more swollen based on what I see today. Fortunately I do not see any signs of active infection locally nor systemically at this time which is great news. No fevers, chills, nausea, vomiting, or diarrhea. 08/15/2021 upon evaluation today  patient actually appears to be doing excellent in regard to her wounds. She has been tolerating the dressing changes without complication. Fortunately I see no evidence of infection currently which is great news. No fevers, chills, nausea, vomiting, or diarrhea. 08/29/2021 upon evaluation patient appears to be doing excellent she in fact is almost completely healed. I am actually very pleased with where we stand today and I think that she is making wonderful progress she just has a small area of weeping on the right lateral leg everything else is pretty much completely healed which is also. 09/05/2021 upon evaluation today patient appears to be doing well with regard to her legs. She does have a small area of weeping on the right leg and there is a small area on the left leg as well. It is potentially something that may need to be addressed in the future more specifically but right now I think that there may have just been a little drainage here on the left. With all that being said I think that this still does not appear to be infected and overall I feel like she is doing quite well. That something we always have to keep a very close eye on as she can change very rapidly from okay to not okay. 09/12/2021 upon evaluation today patient appears to be doing a little bit worse in regard to her leg and actually somewhat concerned about the possibility of infection here. I discussed that with the patient. For that reason I am going to go ahead and see about getting her set up for a repeat prescription for the Bactrim. She is in  agreement with that plan. 3/14; patient presents for follow-up. She has been using silver alginate with dressing changes. She is still taking Bactrim prescribed at last clinic visit. She has no issues or complaints today. She has lymphedema pumps however does not use these. 09/26/2020 upon evaluation today patient appears to actually be doing well in regard to her right leg I am pleased in that regard. Unfortunately she has new areas on her left leg that are open at this point that I do need to be addressed. I am also concerned about the warmth around one of the wounds on the more anterior side. I think this is something that we may need to culture today potentially requiring antibiotics going forward. 10/03/2021 upon evaluation today patient unfortunately is not doing nearly as well as she was even last week with her wounds. She is having some issues here with increased cellulitis of the left lower extremity unfortunately. I did prescribe Augmentin for her eczema prescribed last week unfortunately she never actually received this prescription. The pharmacy stated they never got it. We double checked with them today they still did not receive it. I am not sure what happened in that regard. With that being said the patient is in good spirits today she does not appear to be as sick as where she normally is when the cellulitis gets significantly worse as it has in the past. Nonetheless the leg is much more red not just around the ankle where I was seeing at last week on Tuesday but rather now this is going all the way up to almost her knee and is definitely hot to touch compared to the right leg. Nonetheless I feel like she does need to go to the ER for likely IV antibiotics. 10-10-2021 upon evaluation today patient appears to be doing well with regard to her wound. She has been  tolerating the dressing changes that appears to be doing much better. After I saw her last week she did go to the ER they admitted  her to the hospital and gave her IV antibiotics she fortunately is doing significantly better. Overall I am extremely pleased with where things stand today. 10-17-2021 upon evaluation today patient appears to be doing well with regard to her wound. In fact this is looking better and her leg is looking better but she still does have some areas here of erythema. We will continue to address this as soon as possible in my opinion. I do believe she may require some debridement and what appears to be a more defined wound on her left leg at this point. 10-24-2021 upon evaluation patient is definitely showing signs of improvement which is great news. I do not see any evidence of active infection locally or systemically which is great news as well. No fevers, chills, nausea, vomiting, or diarrhea. 10-31-2021. Upon evaluation today patient's leg actually feels better from an infection and wound care perspective. I am actually very pleased in this regard. Unfortunately the biggest issue that I see at this time is that the patient is having a significant issue with her breathing. I did put her on the pulse ox machine to check her oxygen saturation and it was pretty much at 100% which is good but she tells me that when she is walking this is much more difficult for her. She also tells me that when she is trying to bend over to perform her dressing changes she is so short of breath due to the swelling and fluid in her abdominal area that she is just not been able to do this. She is tearful and very concerned today to be honest. I am truly sorry to see her this way I know she is really having a hard time at the moment. 11-14-2021 upon evaluation patient actually appears to be doing significantly better compared to when I last saw her. She was actually admitted to the hospital I believe this for 6 days total after I sent her on 10-31-2021. Subsequently they actually pulled off greater than 50 pounds of fluid she tells me  she feels significantly better her legs are not as tight she actually has some play in the tissue and it is obvious that she is doing much better just from a mobility RAINAH, KIRSHNER ([id number]) [id number] Page 5 of 14 standpoint as well as a breathing standpoint. Overall I am extremely happy for her and how she is doing the leg also looks much better on the left. 11-21-2021 upon evaluation today patient appears to be doing well although it does appear that she is going require some sharp debridement the wound is very dry this is the opposite of what its been she was having so much weeping that it was staying extremely wet. Nonetheless we do need to see about going ahead and debriding the wound today. 11-28-2021 upon evaluation today patient appears to be doing well with regard to her wound I definitely see signs of things continuing to improve which is great news. I do not see any evidence of active infection locally or systemically also great news. 12-05-2021 upon evaluation today patient appears to be doing well with regard to her wound. She has been tolerating the dressing changes. Fortunately there does not appear to be any signs of active infection locally or systemically at this time. No fevers, chills, nausea, vomiting, or diarrhea. 12-12-2021 upon evaluation  patient appears to be doing well with regard to her wound this is actually measuring smaller and looking much better. Fortunately I do not see any signs of active infection locally or systemically at this time which is excellent news. 6/13; small wound area in the folds of her lymphedema just above the left ankle. Fortunately the area looks quite good. She has been using silver alginate ABDs and an Ace wrap which she is able to change her self. She is not using her compression pumps out of fear that this could contribute to heart failure. 12-26-2021 upon evaluation patient's wounds are actually showing signs of  excellent improvement. I am very pleased with where things stand. Overall I think that she has been making excellent progress and I think we are very close to complete resolution which is great news. 01-11-2022 upon evaluation today patient appears to be doing well currently in regard to her legs in general although on the left leg she does have 1 area that still irritated and inflamed on the anterior portion of her leg. Fortunately I do not see any signs of systemic infection locally there might be some infection going on here however which is my biggest concern. Fortunately I do not see any evidence though of this spreading to any other location. 01-16-2022 upon evaluation today patient has actually showing signs of excellent improvement. Fortunately I do not see any evidence of infection locally or systemically which is great news and overall I am very pleased with where we stand at this point. 01-25-2022 upon evaluation today patient actually appears to be doing decently well in regard to her legs. She has been tolerating the dressing changes without complication. Fortunately there does not appear to be any evidence of active infection locally or systemically which is great news and overall I am extremely pleased with where we stand at this point. 02-13-2022 upon evaluation today patient appears to be doing well currently in regard to her wound which is actually showing signs of significant improvement. Fortunately I do not see any evidence of active infection locally or systemically at this time. No fevers, chills, nausea, vomiting, or diarrhea. 02-20-2022 upon evaluation today patient appears to be doing well currently in regard to her wound. She has been tolerating the dressing changes without complication. Fortunately there does not appear to be any signs of active infection locally or systemically at this time. No fevers, chills, nausea, vomiting, or diarrhea. 02-27-2022 upon evaluation today patient  appears to be doing excellent in regard to her wounds on the leg. She has been tolerating the dressing changes without complication this is very close to complete resolution. Fortunately I do not see any signs of infection locally or systemically at this time which is great news. No fevers, chills, nausea, vomiting, or diarrhea. 03-06-2022 upon evaluation today patient appears to be doing well currently in regard to her wound in general. She has been tolerating the dressing changes without complication. Fortunately I do not see any evidence of active infection locally or systemically at this time which is great news. I am concerned a little bit however about not really her wounds but the second toe right foot where there appears to be some cellulitis after she dropped a frying pan on this about 2 weeks ago. She tells me its been itching and burning and I do think that it is possible she may have an infection based on what we are seeing currently. 03-20-2022 upon evaluation today patient actually appears to be  doing much better at this point. Fortunately there does not appear to be any signs of active infection locally or systemically at this time which is great news. No fevers, chills, nausea, vomiting, or diarrhea. 03-27-2022 upon evaluation today patient appears to be doing somewhat poorly in regard to her legs compared to previous. Her right leg has a couple areas that are open and she is very warm and painful to touch around the lateral portion of the ankle area. Left leg is showing signs of little bit more drainage and weeping as well I think that this is probably due to the fact that she is swelling a lot more that she has been in the past. Fortunately I do not see any signs of active infection systemically though locally there is definitely some issues here. 04-03-2022 upon evaluation today patient appears to be doing poorly in regard to her wounds on the legs unfortunately. She also seems to have  more significant cellulitis that has ensued. Unfortunately she has been on doxycycline initially it seemed to be helping but as of this week that is no longer the case. It started getting worse last week and with the reinitiation of the doxycycline nothing has improved. Often when she gets like this IV antibiotics are necessary. 04-19-2022 upon evaluation today patient appears to be doing well currently in regard to her legs from an infection standpoint. She did get IV vancomycin in the hospital and states that her legs did get better when she left she was no longer weeping but she had been in the bed for 4 days straight. With that being said since coming home her legs have begun to weep again the original wound that we were dealing with has healed she does have some weeping over both lower extremities however which is still of concern. 05-03-2022 upon evaluation today patient appears to be doing well currently in regard to her left leg which I feel like is a little bit better unfortunately the right leg is still draining significantly. Fortunately I do not see any signs of infection locally or systemically at this time. No fevers, chills, nausea, vomiting, or diarrhea. 05-10-2022 unfortunately patient actually showed signs of increasing erythema today. I believe that the cellulitis never completely resolved from when she was in the hospital last. Again she has been on doxycycline previously by myself I gave her Keflex more recently as an ongoing prescription to try to prevent infection but again I do not think this is strong enough to be able to get this under control. I really feel like that she is probably can need IV antibiotics and a good period of time in the hospital to be able to keep her legs elevated and not having to get around to do a whole lot in order to allow this to resolve. The patient voiced understanding. 05-24-2022 upon evaluation today patient appears to be doing excellent in  regard to her legs. She has been tolerating the dressing changes without complication. Fortunately there does not appear to be any signs of infection at this time which is great news she has been on cefadroxil since she was discharged from the hospital. The day that we sent her she actually tells me that she ended up in septic shock that day and coded. When she first showed up they did not notice it on the lab work but said by the end of the day she had already undergone this and they had had to address the issue obviously when  she coded. She tells me that likely she would not be here today if she had not gone to the hospital that day. I explained to her that I do believe that was a God thing and it was just the perfect timing that he had for her at that time. 06-14-2022 upon evaluation today patient appears to be doing well currently in regard to her legs. In general I think she is making good progress and I do not see any signs of infection at this time which is great news. No fevers, chills, nausea, vomiting, or diarrhea. Electronic Signature(s) Signed: 06/14/2022 2:41:09 PM By: Worthy Keeler PA-C Entered By: Worthy Keeler on 06/14/2022 14:41:08 [name] ([id number]) [id number] Page 6 of 14 -------------------------------------------------------------------------------- Physical Exam Details Patient Name: Date of Service: VERITA, KURODA 06/14/2022 1:30 PM Medical Record Number: [id number] Patient Account Number: [id number] Date of Birth/Sex: Treating RN: [birth year] (66 y.o. [place] Primary Care Provider: Ranae Plumber Other Clinician: Rosalio Loud Referring Provider: Treating Provider/Extender: Skeet Simmer in Treatment: 38 Constitutional Well-nourished and well-hydrated in no acute distress. Respiratory normal breathing without difficulty. Psychiatric this patient is able to make decisions and demonstrates good  insight into disease process. Alert and Oriented x 3. pleasant and cooperative. Notes Patient's wound bed actually showed signs of being completely closed I do not see anything truly open at this point she does have a small area of weeping on the right lateral leg but this is a tiny compared to anything that she has had previous. Electronic Signature(s) Signed: 06/14/2022 2:41:33 PM By: Worthy Keeler PA-C Entered By: Worthy Keeler on 06/14/2022 14:41:33 -------------------------------------------------------------------------------- Physician Orders Details Patient Name: Date of Service: MELLIE, BUCCELLATO 06/14/2022 1:30 PM Medical Record Number: [id number] Patient Account Number: [id number] Date of Birth/Sex: Treating RN: 02-05-1956 (66 y.o. 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 Nurse Visit as needed Bathing/ Shower/ Hygiene Clean wound with Normal Saline or wound cleanser. Wash wounds with antibacterial soap and water. May shower; gently cleanse wound with antibacterial soap, rinse and pat dry prior to dressing wounds No tub bath. Edema Control - Lymphedema / Segmental Compressive Device / Other A wraps ce Elevate, Exercise Daily and A void Standing for Long Periods of Time. Elevate leg(s) parallel to the floor when sitting. DO YOUR BEST to sleep in the bed at night. DO NOT sleep in your recliner. Long hours of  sitting in a recliner leads to swelling of the legs and/or potential wounds on your backside. Other: - use lymphedema pumps Non-Wound Condition Bilateral Lower Extremities Cleanse affected area with antibacterial soap and water, pply appropriate compression. - use abdominal pads them ace wrap to secure A dditional non-wound orders/instructions: - silver cell to open weeping areas A Medications-Please add to medication list. ntibiotics - continue cefadroxil as directed P.O. A Electronic Signature(s) Signed: 06/14/2022 4:35:46 PM By: Worthy Keeler PA-C Signed: 06/15/2022 10:21:17 AM By: Massie Kluver Entered By: Massie Kluver on 06/14/2022 14:26:12 -------------------------------------------------------------------------------- Problem List Details Patient Name:  Date of Service: [name], [name] 06/14/2022 1:30 PM Medical Record Number: [id number] Patient Account Number: [id number] Date of Birth/Sex: Treating RN: 1956-01-11 (66 y.o. 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 08/01/2021 No Yes of skin L97.821 Non-pressure chronic ulcer of other part of left lower leg limited to breakdown 08/01/2021 No Yes of skin Inactive Problems Resolved Problems IONE, SANDUSKY ([id number]) [id number] Page 8 of 14 Electronic Signature(s) Signed: 06/14/2022 1:44:27 PM By: Worthy Keeler PA-C Entered By: Worthy Keeler on 06/14/2022 13:44:27 -------------------------------------------------------------------------------- Progress Note Details Patient Name: Date of Service: [name], [name] 06/14/2022 1:30 PM Medical  Record Number: [id number] Patient Account Number: [id number] Date of Birth/Sex: Treating RN: 01-May-1956 (66 y.o. [place] Primary Care Provider: Ranae Plumber Other Clinician: Rosalio Loud Referring Provider: Treating Provider/Extender: Skeet Simmer in Treatment: 31 Subjective Chief Complaint Information obtained from Patient Bilateral LE lymphedema with Left LE Ulcers History of Present Illness (HPI) The patient is a 66 year old female with history of hypertension and a long-standing history of bilateral lower extremity lymphedema (first presented on 4/2) . She has had open ulcers in the past which have always responded to compression therapy. She had briefly been to a lymphedema clinic in the past which helped her at the time. this time around she stopped treatment of her lymphedema pumps approximately 2 weeks ago because of some pain in the knees and then noticed the right leg getting worse. She was seen by her PCP who put her on clindamycin 4 times a day 2 days ago. The patient has seen [facility] and Dr. Delana Meyer had seen her last year where a vascular study including venous and arterial duplex studies were within normal limits. he had recommended compression stockings and lymphedema pumps and the patient has been using this in about 2 weeks ago. She is known to be diabetic but in the past few time she's gone to her primary care doctor her hemoglobin A1c has been normal. 02/11/2015 - after her last visit she took my advice and went to the ER regarding the progressive cellulitis of her right lower extremity and she was admitted between July 17 and 22nd. She received IV antibiotics and then was sent home on a course of steroid-induced and oral antibiotics. She has improved much since then. 02/17/2015 -- she has been doing fine and the weeping of her legs has remarkably gone down. She has no fresh issues. READMISSION 01/15/18 This patient was given this clinic before  most recently in 2016 seen by Dr. Con Memos. She has massive bilateral lymphedema and over the last 2 months this had weeping edema out of the left leg. She has compression pumps but her compliance with these has been minimal. She has [facility] they've been using TCA/ABDs/kerlix under an Ace wrap.she has had recent problems with cellulitis. She was apparently seen in the ER and 12/23/17 and given clindamycin. She was then followed by her primary doctor and given doxycycline and Keflex. The pain seems to have settled down. In April 2018 the patient had arterial studies done at [facility]. This showed triphasic waveforms throughout the right leg and mostly triphasic waveforms on the left except for monophasic at the posterior  tibial artery distally. She was not felt to have evidence of right lower extremity arterial stenosis or significant problems on the left side. 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 She also had a right lower extremity venous Doppler in January 2018 this was limited by the patient's body habitus and lymphedema. Most of the proximal veins were not visualized The patient presents with an area of denuded skin on the anterior medial part of the left calf. There is weeping edema fluid here. 01/22/18; the patient has somewhat better edema control using her compression pumps twice a day and as a result she has much better epithelialization on the left anterior calf area. Only a small open area remains. 01/29/18; the patient has been compliant with her compression pumps. Both the areas on her calf that healed. The remaining area on the left anterior leg is fully epithelialized Readmission: 02/20/2019 upon evaluation today patient presents for reevaluation due to issues that she is having with the bilateral lower extremities. She actually has wounds open on both legs. On the right she has an area in the crease of her leg on the right around the  knee region which is actually draining quite a bit and actually has some fungal type appearance to it. She has been on nystatin powder that seems to have helped to some degree. In regard to the left lower extremity this is actually in the lower portion of her leg closer to the ankle and again is continuing to drain as well unfortunately. There does not appear to be any signs of active infection at this time which is good news. No fevers, chills, nausea, vomiting, or diarrhea. She tells me that since she was seen last year she is actually been doing quite well for the most part with regard to her lower extremities. Unfortunately she now is experiencing a little bit more drainage at this time. She is concerned about getting this under control so that it does not get significantly worse. 02/27/2019 on evaluation today patient appears to be doing somewhat better in regard to her bilateral lower extremity wounds. She has been tolerating the dressing changes without complication. Fortunately there is no signs of active infection at this point. No fevers, chills, nausea, vomiting, or diarrhea. She did get her dressing supplies which is excellent news she was extremely excited to get these. She also got paperwork from [employer] for their financial assistance program where they may be able to help her out in the future if needed with supplies at discounted prices. MABREY, HOWLAND ([id number]) [id number] Page 9 of 14 03/06/2019 on evaluation today patient appears to be doing a little worse with regard to both areas of weeping on her bilateral lower extremities. This is around the right medial knee and just above the left ankle. With that being said she is unfortunately not doing as well as I would like to see. I feel like she may need to potentially go see someone at the lymphedema clinic as the wraps that she needs or even beyond what we can do here at the wound care center. She  really does not have wounds she just has open areas of weeping that are causing some difficulty for her. Subsequently because of this and the moisture I am concerned about the potential for infection I am going to likely give her a prophylactic antibiotic today, Keflex, just to be on the safe side. Nonetheless again there is no obvious signs of active infection at  this time. 03/13/2019 on evaluation today patient appears to be doing well with regard to her bilateral lower extremities where she has been weeping compared to even last week's evaluation. I see some areas of new skin growth which is excellent and overall I am very pleased with how things seem to be progressing. No fevers, chills, nausea, vomiting, or diarrhea. 03/20/2019 on evaluation today patient unfortunately is continuing to have issues with significant edema of the left lower extremity. Her right side seems to be doing much better. Unfortunately her left side is showing increased weeping of the lower portion of her leg. This is quite unfortunate obviously we were hoping to get her into the lymphedema clinic they really do not seem to when I see her how if she is draining. Despite the fact this is really not wound related but more lymphedema weeping related. Nonetheless I do not know that this can be helpful for her to even go for that appointment since again I am not sure there is much that they would actually do at this point. We may need to try a 4 layer compression wrap as best we can on her leg. She is on the Augmentin currently although I am still concerned about whether or not there could be potentially something going on infection wise I would obtain a culture though I understand is not the best being that is a surface culture I just 1 to make sure I do not seem to be missing anything. 03/27/2019 on evaluation today patient appears to be doing much better in regard to the left lower extremity compared to last week. Last week she  had tremendous weeping which I think was subsequent to infection now she seems to be doing much better and very pleased. This is not completely healed but there is a lot of new skin growth and it has dried out quite a bit. Overall I think that we are doing well with how things are moving along at this time. No fevers, chills, nausea, vomiting, or diarrhea. 04/03/2019 evaluation today patient appears to be doing a little worse this week compared to last time I saw her. I think this may be due to the fact that she is having issues with not being able to sleep in her bed at least not until last night. She is therefore been in a lift chair and subsequently has also had issues with not been able to use her pumps since she could not get in bed. With that being said the patient overall seems to be doing okay I do think I may want extend the antibiotic for a little bit longer at least until we can see if her edema and her weeping gets better and if it is then obviously I can always discontinue the antibiotics as of next week however I want her to continue to have it over the next week. 04/10/2019 on evaluation today patient unfortunately is still doing poorly with regard to her left lower extremity. Her right is all things considering doing fairly well. On the left however she continues to have spreading of the area of infection and weeping which appears to be even a larger surface area than noted last week. She did have a positive culture for Pseudomonas in particular which seems to have been of concern she still has green/yellow discharge consistent with Pseudomonas and subsequently a tremendous amount of it. This has me obviously still concerned about the infection not really clearing up despite the fact that  on culture it appears the Cipro should have been a good option for treating this. I think she may at this point need IV antibiotics since things are not doing better I do not want to get worse and cause  sepsis. She is in agreement with the plan and believes as well that she likely does need to go to the hospital for IV vancomycin. Or something of the like depending on what the recommendation is from the ER. 04/17/2019 on evaluation today patient appears to be doing excellent in regard to her lower extremity on the left. She was in the hospital for several days from when I sent her last we saw her until just this past Tuesday. Fortunately her drainage is significantly improved and in fact is mostly clear. There is just a couple small areas that may still drain a little bit she states that the [facility] they prescribed for her at discharge she went picked up from pharmacy and got home but has not been able to find it since. She is looked everywhere. She is wondering if I will replace that for her today I will be more than happy to do that. 05/01/2019 on evaluation today patient actually appears to be doing quite well with regard to her lower extremities. She occasionally is having areas that will leak and then heal up mainly when a piece of the fibrotic skin pops off but fortunately she is not having any signs of active infection at this time. Overall she also really does not have any obvious weeping at this time. I do believe however she really needs some compression wraps and I think this may be a good time to get her back to the lymphedema clinic. 05/11/2019 on evaluation today patient actually appears to be doing quite well with regard to her bilateral lower extremities. She occasionally will have a small area that we per another but in general seems to be completely healed which is great news. Overall very pleased with how everything seems to be progressing. She does have her appointment with lymphedema clinic on November 18. 05/25/2019 on evaluation today patient appears to be doing well with regard to her left lower extremity. I am very pleased in this regard. In regard to her right leg this actually  did start draining more I think it is mainly due to the fact that her leg is more swollen. I am not seeing any obvious signs of infection at this time although that is definitely something were obviously acutely aware of simply due to the fact that she had an issue not too far back with exactly this issue. Nonetheless I do feel like that lymphedema clinic would still be beneficial for her. I explained obviously if they are not able to do anything treatment wise on the right leg we could at least have them treat her left leg and then proceed from there. The patient is really in agreement with that plan. If they are able to do both as the drainage slows down that I would be happy to let them handle both. 06/01/2019 on evaluation today patient unfortunately appears to be doing worse with regard to her right lower extremity. The left lower extremity is still maintaining at this point. Unfortunately she has been having significantly increased pain over the past several days and has been experiencing as well increased swelling of the right lower extremity. I really do not know that I am seeing anything that appears to be obvious for infection at this point  to be peripherally honest. With that being said the patient does seem to be having much more swelling that she is even experienced in the past and coupled with increased pain in her hip as well I am concerned that again she could potentially have a DVT although I am not 100% sure of this. I think it something that may need to be checked out. We discussed the possibility of sending her for a DVT study through the hospital but unfortunately transportation is an issue if she does have a DVT I do not want her to wait days to be able to get in for that test however if she has this scheduled as an outpatient that is as fast that she will be able to get the test scheduled for transportation purposes. That will also fall on Thanksgiving so subsequently she did  actually be looking at either Friday or even next week before we would know anything back from this. That is much too long in my opinion. Subsequent to the amount of discomfort she is experiencing the patient is actually okay with going to the ER for evaluation today. 06/12/2019 on evaluation today patient actually appears to be doing significantly better compared to last time I saw her. Following when I last saw her she was actually in the hospital from that Monday until the following Sunday almost 1 full week. She actually was placed on Keflex in the hospital following the time for her to be discharged and Dr. Steva Ready has recommended 2 times a day dosing of the Keflex for the next year in order to help with more prophylactic/preventative measures with regard to her developing cellulitis. Overall I think this sounds like an excellent plan. The patient unfortunately is good to have trouble being treated at lymphedema clinic due to the fact that she really cannot get up on the bed that they have there. They also state that they cannot manage her as long as she has anything draining at this point. Obviously that is somewhat unfortunate as she does need help with edema control but nonetheless we will have to do what we can for her outside of it sounds like the lymphedema clinic scenario at this point. 06/19/2019 on evaluation today patient appears to be doing fairly well with regard to her bilateral lower extremities. She is not nearly as swollen and shows no signs of infection at this point. There is no evidence of cellulitis whatsoever. She also has no open wounds or draining at this point which is also good news. No fever chills noted. She seems to be in very good spirits and in fact appears to be doing quite well. READMISSION 11/27/2019 This is a 66 year old woman that we have had in this clinic several times before including 2015, 16 and 19 and then most recently from 03/20/2019 through 06/19/2019  with bilateral lower extremity lymphedema. She has had previous arterial and reflux studies done years ago which were not all that remarkable. 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 She does have a positive family history and she had large legs starting may be in her 75s. She was recently in hospital from 10/20/2019 through 10/28/2019 with right leg cellulitis. She was given Ancef and clindamycin and then Zosyn when a culture showed Pseudomonas. At that time there was purulent drainage. She was followed by infectious disease Dr Steva Ready. ZETTIE, GOOTEE ([id number]) [id number] Page 10 of 14 The patient is now back at home. She  has noted increased swelling in the right and no drainage in her right leg mostly on the posterior medial aspect in the calf area. She has not had pain or fever. She has literally been improved lysing above dressings because her at the area of this is far too large for standard compression. She has been wrapping the areas with sheets to resorptive pads. She is found these helped somewhat. She does have an appointment with the lymphedema clinic in [place] in late June. Past medical history includes bilateral lymphedema, hypertension, obstructive sleep apnea with CPAP. Recent hospitalization with apparently Pseudomonas cellulitis of the right lower leg 12/15/2019 upon evaluation today patient appears to be doing a little bit worse in regard to her right lower extremity. Unfortunately she is having more weeping down in the lower portion of her leg. Fortunately there is no signs of active infection at this time. No fever chills noted. The patient states she is not having increased pain except for when she attempted to use the lymphedema pumps unfortunately she states that she did have pain when she did this. Otherwise we been using absorptive dressings of one type or another she is using diapers  at home and then subsequently Ace wraps. In regard to the barrier cream we have discussed the possibility of derma cloud which she would like to try I do not have a problem with that. 12/22/2019 upon evaluation today patient actually appears to be doing better in regard to her leg ulcers at this point. Fortunately there does not appear to be any signs of active infection which is great news and I am extremely pleased with where things are progressing at this time. There is no sign of active infection currently. The patient is very pleased to see things doing so well. 12/29/2019 upon evaluation today patient appears to be doing a little bit better in regard to her weeping in general over her lower extremities. She does have some signs of mild erythema little bit more than what I noted last week or rather last visit. Nonetheless I think that my threshold for switching her antibiotics from Keflex to something else is very low at this point considering that she has had such severe infections in the past that seem to come almost out of nowhere. There is a little erythema and warmth noted of the lower portion of her leg compared to the upper which also makes me want to go ahead and address things more rapidly at this point. Likely I would switch out the Keflex for something like Levaquin ideally. 7/16; patient with severe bilateral lymphedema. She has superficial wounds albeit almost circumferential now on the left lateral lower leg. This may be new from last time. Small area on the right anterior lower leg and then another area on the right medial lower leg and of pannus fold. She has been using various absorptive garments. She states she is using her compression pumps once a day occasionally twice. Culture from her last visit here was negative 01/29/2020 on evaluation today patient appears to be doing excellent at this point in regard to her legs with regard to infection I see no signs of active infection at  this point. She still does have unfortunately areas of weeping this is minimal on the right now her left is actually significantly worse although I do not think it is as bad as last week with Dr. Dellia Nims saw her. She has been trying to pump and elevate her legs is much as  possible. She has previously been on the Keflex and in the past for prevention that seems to do fairly well and likely can extend that today. 02/04/2020 on evaluation today patient appears to be doing better in regard to her legs bilaterally. Fortunately there is no signs of active infection at this time which is great news and overall she has less weeping on the left compared to the right and there is several spots where she is pretty much sealed up with no draining regions. Overall very pleased in this regard. 02/19/2020 on evaluation today patient appears to be doing very well in regard to her wounds currently. Fortunately there is no evidence of active infection overall very pleased with where things stand. She is significantly improved in regard to her edema I am extremely pleased in this regard she tells me that the popping no longer hurts and in fact she actually looks forward to it. 03/04/2020 on evaluation today patient appears to be doing excellent in regard to her lower extremities. Fortunately there is no signs of active infection at this time. No fevers, chills, nausea, vomiting, or diarrhea. 03/25/2020 on evaluation today patient appears to be doing a little bit more poorly in regard to her legs at this point. She tells me that she is still continue to have issues with drainage and this has been a little bit worse she was getting ready to start taking the Keflex again but wanted to see me first. Fortunately there is no signs of active infection at this time. No fevers, chills, nausea, vomiting, or diarrhea. 04/15/2020 upon evaluation today patient appears to be doing somewhat poorly in regard to her right leg. She tells me she  has been having more pain she has been taking the Keflex that was previously prescribed unfortunately that just does not seem to help with this. She was hoping that the pain on her right was actually coming from the fact that she was having issues with her wrap having gotten caught in her recliner. With that being said she tells me that she knew something was not right. Currently her right leg is warm to touch along with being erythematous all the way up to around at least mid thigh as far as I can see. The left leg does not appear to be doing that badly though there is increased weeping around the ankle region. 05/06/2020 on evaluation today patient appears to be doing much better than last time I saw her. She did go to the hospital where she was admitted for 2 days and treated with antibiotic therapy. She was discharged with antibiotics as well and has done extremely well. I am extremely pleased with where things stand today. There is no signs of active infection at this time which is great news. 05/27/2020 upon evaluation today patient appears to be doing well with regard to her lower extremities bilaterally. She has just a very tiny area on the right leg which is opening on the left leg she is significantly improved though she still has several areas that do appear to be open this is minimal compared to what is been in the past. In general I am extremely pleased with where things stand today. The patient does tell me she is not been using her pumps quite as much as she should be. I do believe that is 1 area she can definitely work on. She has had a lot going on including a Covid exposure and apparently also a outbreak of likely shingles.  06/24/2020 upon evaluation today patient appears to be doing well with regard to her legs in general although the left leg unfortunately is showing some signs of erythema she does have a little bit of increased weeping and to be honest I am concerned about  infection here. I discussed that with her today and I think that we may need to address this sooner rather than later she has been taking Keflex she is not really certain that is been making a big improvement however. No fevers, chills, nausea, vomiting, or diarrhea. 06/30/2020 upon evaluation today patient's legs actually seem to be doing better in my opinion as compared to where they were last week. Fortunately there does not appear to be any signs of active infection. Her culture showed multiple organisms nothing predominate. With that being said the Levaquin seems to have done well I think she has improved since I last saw her as well. 07/14/2020 upon evaluation today patient appears to be doing actually better in regard to her lower extremities in my opinion. She has been tolerating the dressing changes without complication. Fortunately there is no signs of active infection at this time. 08/04/2020 upon evaluation today patient appears to be doing excellent in regard to her leg ulcers. Fortunately she has very little that is open at this point. This is great news. In fact I think that the [employer] medicated powder has been excellent for her. It seems to have done the trick where we had tried several other things without as much success. Fortunately there is no evidence of active infection at this time. No fevers, chills, nausea, vomiting, or diarrhea. 09/01/2020 upon evaluation today patient appears to be doing a little bit more poorly than the last time I saw her. She tells me right now that she has been having a lot of drainage compared to where things were previous which has unfortunately led to more irritation as well. She is concerned that this is leading to infection. Again based on what I am seeing today as well I am also concerned of the same to be honest. 09/15/2020 upon evaluation today patient appears to be doing well with regard to her legs compared to what I saw previous. Fortunately there  does not appear to be any evidence of active infection at this time which is great news. At least not as badly as what it was previous. With that being said I do believe that the patient does need to have an extension of the antibiotics. This I believe will actually help her more in the way of making sure this stays under control and does not worsen. That was the Bactrim DS. Readmission: 08/01/2021 upon evaluation today patient appears to be doing actually pretty well all things considered. Is actually been almost a year since have seen her last March. Fortunately I do not see any evidence of active infection locally nor systemically at this point. She does have a couple open areas on the left leg the [name], [name] ([id number]) [id number] Page 11 of 14 right leg appears to be doing quite well. In general she has been in the hospital 3 times since I saw her a year ago all 4 cellulitis type issues except for the last 1 which was actually more related to congestive heart failure for that reason she is not using her lymphedema pumps at this point and that is probably the best idea. 08/08/2021 upon evaluation today patient appears to be doing well with regard to her legs all  things considered her left leg may be a little bit more swollen based on what I see today. Fortunately I do not see any signs of active infection locally nor systemically at this time which is great news. No fevers, chills, nausea, vomiting, or diarrhea. 08/15/2021 upon evaluation today patient actually appears to be doing excellent in regard to her wounds. She has been tolerating the dressing changes without complication. Fortunately I see no evidence of infection currently which is great news. No fevers, chills, nausea, vomiting, or diarrhea. 08/29/2021 upon evaluation patient appears to be doing excellent she in fact is almost completely healed. I am actually very pleased with where we stand today and I  think that she is making wonderful progress she just has a small area of weeping on the right lateral leg everything else is pretty much completely healed which is also. 09/05/2021 upon evaluation today patient appears to be doing well with regard to her legs. She does have a small area of weeping on the right leg and there is a small area on the left leg as well. It is potentially something that may need to be addressed in the future more specifically but right now I think that there may have just been a little drainage here on the left. With all that being said I think that this still does not appear to be infected and overall I feel like she is doing quite well. That something we always have to keep a very close eye on as she can change very rapidly from okay to not okay. 09/12/2021 upon evaluation today patient appears to be doing a little bit worse in regard to her leg and actually somewhat concerned about the possibility of infection here. I discussed that with the patient. For that reason I am going to go ahead and see about getting her set up for a repeat prescription for the Bactrim. She is in agreement with that plan. 3/14; patient presents for follow-up. She has been using silver alginate with dressing changes. She is still taking Bactrim prescribed at last clinic visit. She has no issues or complaints today. She has lymphedema pumps however does not use these. 09/26/2020 upon evaluation today patient appears to actually be doing well in regard to her right leg I am pleased in that regard. Unfortunately she has new areas on her left leg that are open at this point that I do need to be addressed. I am also concerned about the warmth around one of the wounds on the more anterior side. I think this is something that we may need to culture today potentially requiring antibiotics going forward. 10/03/2021 upon evaluation today patient unfortunately is not doing nearly as well as she was even last week  with her wounds. She is having some issues here with increased cellulitis of the left lower extremity unfortunately. I did prescribe Augmentin for her eczema prescribed last week unfortunately she never actually received this prescription. The pharmacy stated they never got it. We double checked with them today they still did not receive it. I am not sure what happened in that regard. With that being said the patient is in good spirits today she does not appear to be as sick as where she normally is when the cellulitis gets significantly worse as it has in the past. Nonetheless the leg is much more red not just around the ankle where I was seeing at last week on Tuesday but rather now this is going all the way  up to almost her knee and is definitely hot to touch compared to the right leg. Nonetheless I feel like she does need to go to the ER for likely IV antibiotics. 10-10-2021 upon evaluation today patient appears to be doing well with regard to her wound. She has been tolerating the dressing changes that appears to be doing much better. After I saw her last week she did go to the ER they admitted her to the hospital and gave her IV antibiotics she fortunately is doing significantly better. Overall I am extremely pleased with where things stand today. 10-17-2021 upon evaluation today patient appears to be doing well with regard to her wound. In fact this is looking better and her leg is looking better but she still does have some areas here of erythema. We will continue to address this as soon as possible in my opinion. I do believe she may require some debridement and what appears to be a more defined wound on her left leg at this point. 10-24-2021 upon evaluation patient is definitely showing signs of improvement which is great news. I do not see any evidence of active infection locally or systemically which is great news as well. No fevers, chills, nausea, vomiting, or diarrhea. 10-31-2021. Upon  evaluation today patient's leg actually feels better from an infection and wound care perspective. I am actually very pleased in this regard. Unfortunately the biggest issue that I see at this time is that the patient is having a significant issue with her breathing. I did put her on the pulse ox machine to check her oxygen saturation and it was pretty much at 100% which is good but she tells me that when she is walking this is much more difficult for her. She also tells me that when she is trying to bend over to perform her dressing changes she is so short of breath due to the swelling and fluid in her abdominal area that she is just not been able to do this. She is tearful and very concerned today to be honest. I am truly sorry to see her this way I know she is really having a hard time at the moment. 11-14-2021 upon evaluation patient actually appears to be doing significantly better compared to when I last saw her. She was actually admitted to the hospital I believe this for 6 days total after I sent her on 10-31-2021. Subsequently they actually pulled off greater than 50 pounds of fluid she tells me she feels significantly better her legs are not as tight she actually has some play in the tissue and it is obvious that she is doing much better just from a mobility standpoint as well as a breathing standpoint. Overall I am extremely happy for her and how she is doing the leg also looks much better on the left. 11-21-2021 upon evaluation today patient appears to be doing well although it does appear that she is going require some sharp debridement the wound is very dry this is the opposite of what its been she was having so much weeping that it was staying extremely wet. Nonetheless we do need to see about going ahead and debriding the wound today. 11-28-2021 upon evaluation today patient appears to be doing well with regard to her wound I definitely see signs of things continuing to improve which is  great news. I do not see any evidence of active infection locally or systemically also great news. 12-05-2021 upon evaluation today patient appears to be doing  well with regard to her wound. She has been tolerating the dressing changes. Fortunately there does not appear to be any signs of active infection locally or systemically at this time. No fevers, chills, nausea, vomiting, or diarrhea. 12-12-2021 upon evaluation patient appears to be doing well with regard to her wound this is actually measuring smaller and looking much better. Fortunately I do not see any signs of active infection locally or systemically at this time which is excellent news. 6/13; small wound area in the folds of her lymphedema just above the left ankle. Fortunately the area looks quite good. She has been using silver alginate ABDs and an Ace wrap which she is able to change her self. She is not using her compression pumps out of fear that this could contribute to heart failure. 12-26-2021 upon evaluation patient's wounds are actually showing signs of excellent improvement. I am very pleased with where things stand. Overall I think that she has been making excellent progress and I think we are very close to complete resolution which is great news. 01-11-2022 upon evaluation today patient appears to be doing well currently in regard to her legs in general although on the left leg she does have 1 area that still irritated and inflamed on the anterior portion of her leg. Fortunately I do not see any signs of systemic infection locally there might be some infection going on here however which is my biggest concern. Fortunately I do not see any evidence though of this spreading to any other location. 01-16-2022 upon evaluation today patient has actually showing signs of excellent improvement. Fortunately I do not see any evidence of infection locally or systemically which is great news and overall I am very pleased with where we stand at  this point. 01-25-2022 upon evaluation today patient actually appears to be doing decently well in regard to her legs. She has been tolerating the dressing changes without complication. Fortunately there does not appear to be any evidence of active infection locally or systemically which is great news and overall I am extremely QUEEN, ABBETT ([id number]) [phone].pdf Page 12 of 14 pleased with where we stand at this point. 02-13-2022 upon evaluation today patient appears to be doing well currently in regard to her wound which is actually showing signs of significant improvement. Fortunately I do not see any evidence of active infection locally or systemically at this time. No fevers, chills, nausea, vomiting, or diarrhea. 02-20-2022 upon evaluation today patient appears to be doing well currently in regard to her wound. She has been tolerating the dressing changes without complication. Fortunately there does not appear to be any signs of active infection locally or systemically at this time. No fevers, chills, nausea, vomiting, or diarrhea. 02-27-2022 upon evaluation today patient appears to be doing excellent in regard to her wounds on the leg. She has been tolerating the dressing changes without complication this is very close to complete resolution. Fortunately I do not see any signs of infection locally or systemically at this time which is great news. No fevers, chills, nausea, vomiting, or diarrhea. 03-06-2022 upon evaluation today patient appears to be doing well currently in regard to her wound in general. She has been tolerating the dressing changes without complication. Fortunately I do not see any evidence of active infection locally or systemically at this time which is great news. I am concerned a little bit however about not really her wounds but the second toe right foot where there appears to be some cellulitis  after she dropped a frying pan on this about  2 weeks ago. She tells me its been itching and burning and I do think that it is possible she may have an infection based on what we are seeing currently. 03-20-2022 upon evaluation today patient actually appears to be doing much better at this point. Fortunately there does not appear to be any signs of active infection locally or systemically at this time which is great news. No fevers, chills, nausea, vomiting, or diarrhea. 03-27-2022 upon evaluation today patient appears to be doing somewhat poorly in regard to her legs compared to previous. Her right leg has a couple areas that are open and she is very warm and painful to touch around the lateral portion of the ankle area. Left leg is showing signs of little bit more drainage and weeping as well I think that this is probably due to the fact that she is swelling a lot more that she has been in the past. Fortunately I do not see any signs of active infection systemically though locally there is definitely some issues here. 04-03-2022 upon evaluation today patient appears to be doing poorly in regard to her wounds on the legs unfortunately. She also seems to have more significant cellulitis that has ensued. Unfortunately she has been on doxycycline initially it seemed to be helping but as of this week that is no longer the case. It started getting worse last week and with the reinitiation of the doxycycline nothing has improved. Often when she gets like this IV antibiotics are necessary. 04-19-2022 upon evaluation today patient appears to be doing well currently in regard to her legs from an infection standpoint. She did get IV vancomycin in the hospital and states that her legs did get better when she left she was no longer weeping but she had been in the bed for 4 days straight. With that being said since coming home her legs have begun to weep again the original wound that we were dealing with has healed she does have some weeping over both lower  extremities however which is still of concern. 05-03-2022 upon evaluation today patient appears to be doing well currently in regard to her left leg which I feel like is a little bit better unfortunately the right leg is still draining significantly. Fortunately I do not see any signs of infection locally or systemically at this time. No fevers, chills, nausea, vomiting, or diarrhea. 05-10-2022 unfortunately patient actually showed signs of increasing erythema today. I believe that the cellulitis never completely resolved from when she was in the hospital last. Again she has been on doxycycline previously by myself I gave her Keflex more recently as an ongoing prescription to try to prevent infection but again I do not think this is strong enough to be able to get this under control. I really feel like that she is probably can need IV antibiotics and a good period of time in the hospital to be able to keep her legs elevated and not having to get around to do a whole lot in order to allow this to resolve. The patient voiced understanding. 05-24-2022 upon evaluation today patient appears to be doing excellent in regard to her legs. She has been tolerating the dressing changes without complication. Fortunately there does not appear to be any signs of infection at this time which is great news she has been on cefadroxil since she was discharged from the hospital. The day that we sent her she actually tells  me that she ended up in septic shock that day and coded. When she first showed up they did not notice it on the lab work but said by the end of the day she had already undergone this and they had had to address the issue obviously when she coded. She tells me that likely she would not be here today if she had not gone to the hospital that day. I explained to her that I do believe that was a God thing and it was just the perfect timing that he had for her at that time. 06-14-2022 upon evaluation today  patient appears to be doing well currently in regard to her legs. In general I think she is making good progress and I do not see any signs of infection at this time which is great news. No fevers, chills, nausea, vomiting, or diarrhea. Objective Constitutional Well-nourished and well-hydrated in no acute distress. Vitals Time Taken: 1:56 PM, Height: 63 in, Weight: 372 lbs, BMI: 65.9, Temperature: 97.8 F, Pulse: 101 bpm, Respiratory Rate: 18 breaths/min, Blood Pressure: 144/84 mmHg. Respiratory normal breathing without difficulty. Psychiatric this patient is able to make decisions and demonstrates good insight into disease process. Alert and Oriented x 3. pleasant and cooperative. General Notes: Patient's wound bed actually showed signs of being completely closed I do not see anything truly open at this point she does have a small area of weeping on the right lateral leg but this is a tiny compared to anything that she has had previous. Other Condition(s) Patient presents with Lymphedema located on the Right Leg. General Notes: swelling down, slight drainage noted to posterior lower leg Patient presents with Lymphedema located on the Left Leg. 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 Nurse Visit as needed Bathing/ Shower/ Hygiene: Clean wound with Normal Saline or wound cleanser. Wash wounds with antibacterial soap and water. May shower; gently cleanse wound with antibacterial soap, rinse and pat dry prior to dressing wounds No tub bath. Edema Control - Lymphedema / Segmental Compressive Device /  Other: Ace wraps Elevate, Exercise Daily and Avoid Standing for Long Periods of Time. Elevate leg(s) parallel to the floor when sitting. DO YOUR BEST to sleep in the bed at night. DO NOT sleep in your recliner. Long hours of sitting in a recliner leads to swelling of the legs and/or potential wounds on your backside. Other: - use lymphedema pumps Non-Wound Condition: Cleanse affected area with antibacterial soap and water, Apply appropriate compression. - use abdominal pads them ace wrap to secure Additional non-wound orders/instructions: - silver cell to open weeping areas Medications-Please add to medication list.: P.O. Antibiotics - continue cefadroxil as directed 1. Based on what I see I think the best thing is can be for the patient to continue to focus on appropriate elevation of her leg. In general I think that she is doing quite well. 2. I am going to recommend as well that we have the patient continue to monitor for any evidence of infection or worsening. Based on what I see currently I think that she is not showing any evidence of infection she has gone back to the Keflex at this point. 3. I am going  to recommend she use a little silver alginate over that right lateral area where it is weeping but in general she just can use the ABD pads and the compression. She is also using her lymphedema pumps and try to elevate her legs in bed is much as possible. We will see patient back for reevaluation in 1 week here in the clinic. If anything worsens or changes patient will contact our office for additional recommendations. Electronic Signature(s) Signed: 06/14/2022 2:43:31 PM By: Worthy Keeler PA-C Entered By: Worthy Keeler on 06/14/2022 14:43:30 -------------------------------------------------------------------------------- SuperBill Details Patient Name: Date of Service: [name], [name] 06/14/2022 Medical Record Number: [id number] Patient Account Number: [id number] Date of  Birth/Sex: Treating RN: [birth year] (66 y.o. [place] Primary Care Provider: Ranae Plumber Other Clinician: Rosalio Loud Referring Provider: Treating Provider/Extender: Skeet Simmer in Treatment: [address] KLAIR, LEISING ([id number]) [id number] Page 14 of 14 ICD-10 Codes Code Description Q82.0 Hereditary lymphedema L97.811 Non-pressure chronic ulcer of other part of right lower leg limited to breakdown of skin L97.821 Non-pressure chronic ulcer of other part of left lower leg limited to breakdown of skin Facility Procedures : CPT4 Code: [id number] Description: 87564 - WOUND CARE VISIT-LEV 2 EST PT Modifier: Quantity: 1 Physician Procedures : CPT4 Code Description Modifier [id number] - WC PHYS LEVEL 3 - EST PT ICD-10 Diagnosis Description Q82.0 Hereditary lymphedema L97.811 Non-pressure chronic ulcer of other part of right lower leg limited to breakdown of skin L97.821 Non-pressure  chronic ulcer of other part of left lower leg limited to breakdown of skin Quantity: 1 Electronic Signature(s) Signed: 06/14/2022 2:44:06 PM By: Worthy Keeler PA-C Entered By: Worthy Keeler on 06/14/2022 14:44:06

## 2022-06-20 ENCOUNTER — Other Ambulatory Visit
Admission: RE | Admit: 2022-06-20 | Discharge: 2022-06-20 | Disposition: A | Discharge status: Home / Self Care | Payer: Medicare HMO | Source: Ambulatory Visit | Attending: Family | Admitting: Family

## 2022-06-20 ENCOUNTER — Telehealth: Payer: Self-pay | Admitting: Family

## 2022-06-20 ENCOUNTER — Encounter: Payer: Self-pay | Admitting: Family

## 2022-06-20 ENCOUNTER — Ambulatory Visit (HOSPITAL_BASED_OUTPATIENT_CLINIC_OR_DEPARTMENT_OTHER): Payer: Medicare HMO | Admitting: Family

## 2022-06-20 ENCOUNTER — Other Ambulatory Visit (HOSPITAL_COMMUNITY): Payer: Self-pay

## 2022-06-20 VITALS — BP 134/88 | HR 105 | Resp 20 | Wt 398.0 lb

## 2022-06-20 DIAGNOSIS — I10 Essential (primary) hypertension: Secondary | ICD-10-CM

## 2022-06-20 DIAGNOSIS — G4733 Obstructive sleep apnea (adult) (pediatric): Secondary | ICD-10-CM

## 2022-06-20 DIAGNOSIS — I890 Lymphedema, not elsewhere classified: Secondary | ICD-10-CM | POA: Diagnosis not present

## 2022-06-20 DIAGNOSIS — I5032 Chronic diastolic (congestive) heart failure: Secondary | ICD-10-CM | POA: Insufficient documentation

## 2022-06-20 LAB — CBC WITH DIFFERENTIAL/PLATELET
Abs Immature Granulocytes: 0.02 10*3/uL (ref 0.00–0.07)
Basophils Absolute: 0 10*3/uL (ref 0.0–0.1)
Basophils Relative: 0 %
Eosinophils Absolute: 0 10*3/uL (ref 0.0–0.5)
Eosinophils Relative: 0 %
HCT: 37 % (ref 36.0–46.0)
Hemoglobin: 11.5 g/dL — ABNORMAL LOW (ref 12.0–15.0)
Immature Granulocytes: 0 %
Lymphocytes Relative: 33 %
Lymphs Abs: 1.5 10*3/uL (ref 0.7–4.0)
MCH: 26 pg (ref 26.0–34.0)
MCHC: 31.1 g/dL (ref 30.0–36.0)
MCV: 83.5 fL (ref 80.0–100.0)
Monocytes Absolute: 0.6 10*3/uL (ref 0.1–1.0)
Monocytes Relative: 12 %
Neutro Abs: 2.5 10*3/uL (ref 1.7–7.7)
Neutrophils Relative %: 55 %
Platelets: 170 10*3/uL (ref 150–400)
RBC: 4.43 MIL/uL (ref 3.87–5.11)
RDW: 15.2 % (ref 11.5–15.5)
WBC: 4.6 10*3/uL (ref 4.0–10.5)
nRBC: 0 % (ref 0.0–0.2)

## 2022-06-20 LAB — BASIC METABOLIC PANEL
Anion gap: 9 (ref 5–15)
BUN: 32 mg/dL — ABNORMAL HIGH (ref 8–23)
CO2: 26 mmol/L (ref 22–32)
Calcium: 8.9 mg/dL (ref 8.9–10.3)
Chloride: 104 mmol/L (ref 98–111)
Creatinine, Ser: 1.18 mg/dL — ABNORMAL HIGH (ref 0.44–1.00)
GFR, Estimated: 51 mL/min — ABNORMAL LOW (ref >60)
Glucose, Bld: 95 mg/dL (ref 70–99)
Potassium: 3.4 mmol/L — ABNORMAL LOW (ref 3.5–5.1)
Sodium: 139 mmol/L (ref 135–145)

## 2022-06-20 MED ORDER — DAPAGLIFLOZIN PROPANEDIOL 10 MG PO TABS: 10.0000 mg | ORAL_TABLET | Freq: Every day | ORAL | 5 refills | Status: DC

## 2022-06-20 NOTE — Progress Notes (Signed)
Patient ID: [name], female    DOB: 06/28/1956, 66 y.o.   MRN: [id number]  HPI  [name] is a 66 y/o female with a history of asthma, HTN, arthritis, lymphedema, sleep apnea, spinal stenosis, obesity, previous tobacco use and chronic heart failure.   Echo report from 11/01/21 reviewed and showed an EF of 60-65%.   Admitted 05/10/22 due to worsening edema and drainage. Patient evaluated and found to have cellulitis of the right lower extremity. Had AKI. Vascular and wound consults obtained. Antibiotics started. Became symptomatic with elevated lactic acid and WBC. IVF given and antibiotic changed. CT scan 11/7 did not show any drainable fluid collection. Discharged after 6 days. Admitted 04/03/22 due to right lower leg cellulitis that had failed out patient therapy. Initially given IV antibiotics and then changed to oral meds. She had hypokalemia and hypomagnesemia that were repleted. Discharged after 4 days.   She presents today for a follow-up visit with a chief complaint of moderate fatigue with minimal exertion. Describes this as chronic in nature. She has associated head congestion, cough, chronic lymphedema, palpitations, occasional dizziness, chronic pain and chronic difficulty sleeping due to pain along with this. She denies any abdominal distention, chest pain, wheezing or shortness of breath.   She is unable to weigh herself at home due to lymphedema.   Past Medical History:  Diagnosis Date   Arthritis    Asthma    CHF (congestive heart failure) ([place])    Enlarged heart    Hypertension    Lymphedema    Sleep apnea    Spinal stenosis    Past Surgical History:  Procedure Laterality Date   CENTRAL LINE INSERTION N/A 05/11/2022   Procedure: CENTRAL LINE INSERTION;  Surgeon: Katha Cabal, MD;  Location: [facility];  Service: Cardiovascular;  Laterality: N/A;   COLONOSCOPY WITH PROPOFOL N/A 03/12/2018   Procedure: COLONOSCOPY WITH PROPOFOL;  Surgeon: Manya Silvas, MD;  Location: [facility] END[facility]COPY;  Service: Endoscopy;  Laterality: N/A;   NO PAST SURGERIES     Family History  Problem Relation Age of Onset   Breast cancer Maternal Aunt    Thyroid disease Other    Allergies  Allergen Reactions   Vancomycin Other (See Comments)    Red man syndrome   Ace Inhibitors Hives and Swelling   Ibuprofen Hives and Swelling   Prior to Admission medications   Medication Sig Start Date End Date Taking? Authorizing Provider  acetaminophen (TYLENOL) 500 MG tablet Take 500-1,000 mg by mouth every 6 (six) hours as needed for mild pain or fever.   Yes [provider]  albuterol (VENTOLIN HFA) 108 (90 Base) MCG/ACT inhaler Inhale 1-2 puffs into the lungs every 6 (six) hours as needed for wheezing or shortness of breath.   Yes [provider]  bumetanide (BUMEX) 2 MG tablet Take 1 tablet (2 mg total) by mouth daily. 03/06/22  Yes Kalijah Westfall A, FNP  cephALEXin (KEFLEX) 500 MG capsule Take 2 capsules (1,000 mg total) by mouth daily. Hold these prophylactic antibiotics while you are taking the other antibiotics from hospital Patient taking differently: Take 500 mg by mouth 2 (two) times daily. Hold these prophylactic antibiotics while you are taking the other antibiotics from hospital 05/16/22  Yes Kc, Maren Beach, MD  cetirizine (ZYRTEC) 10 MG tablet Take 1 tablet (10 mg total) by mouth daily. Patient taking differently: Take 10 mg by mouth daily as needed for allergies. 12/25/20  Yes Lavina Hamman, MD  cyanocobalamin 1000 MCG tablet Take 1,000 mcg by mouth every other day.   Yes [provider]  metolazone (ZAROXOLYN) 5 MG tablet Take 0.5 tablets (2.5 mg total) by mouth 2 (two) times a week. Patient taking differently: Take 2.5 mg by mouth 2 (two) times a week. Tues and sat 05/03/22  Yes Dalexa Gentz, Otila Kluver A, FNP  NYSTATIN powder Apply 1 Application topically 2 (two) times daily as needed (Yeast Skin infection). 03/19/22  Yes [provider]  potassium chloride SA (KLOR-CON M) 20 MEQ tablet Take 2 tablets (40 mEq total) by mouth daily. 11/28/21  Yes Cambria Osten A, FNP  senna-docusate (SENOKOT-S) 8.6-50 MG tablet Take 1 tablet by mouth 2 (two) times daily as needed for mild constipation or moderate constipation.   Yes [provider]   Review of Systems  Constitutional:  Positive for fatigue. Negative for appetite change.  HENT:  Positive for congestion. Negative for postnasal drip and sore throat.   Eyes: Negative.   Respiratory:  Positive for cough. Negative for shortness of breath and wheezing.   Cardiovascular:  Positive for palpitations and leg swelling. Negative for chest pain.  Gastrointestinal:  Negative for abdominal distention, abdominal pain and constipation.  Endocrine: Negative.   Genitourinary: Negative.   Musculoskeletal:  Positive for arthralgias (right foot) and back pain (when laying in the bed too long).  Skin: Negative.        Severe lymphedema  Allergic/Immunologic: Negative.   Neurological:  Positive for dizziness (at times). Negative for light-headedness.  Hematological:  Negative for adenopathy. Does not bruise/bleed easily.  Psychiatric/Behavioral:  Positive for sleep disturbance (chronic trouble falling asleep; sleeps in recliner). Negative for dysphoric mood. The patient is not nervous/anxious.    Vitals:   06/20/22 1031  BP: 134/88  Pulse: (!) 105  Resp: 20  SpO2: 95%  Weight: (!) 398 lb (180.5 kg)   Wt Readings from Last 3 Encounters:  06/20/22 (!) 398 lb (180.5 kg)  05/10/22 (!) 382 lb 8 oz (173.5 kg)  04/24/22 (!) 393 lb (178.3 kg)   Lab Results  Component Value Date   CREATININE 1.02 (H) 05/16/2022   CREATININE 1.14 (H) 05/15/2022   CREATININE 1.24 (H) 05/14/2022   Physical Exam Vitals and nursing note reviewed.  Constitutional:      Appearance: She is well-developed.  HENT:     Head: Normocephalic and atraumatic.  Cardiovascular:     Rate and Rhythm: Regular  rhythm. Tachycardia present.  Pulmonary:     Effort: Pulmonary effort is normal. No accessory muscle usage.     Breath sounds: No wheezing, rhonchi or rales.  Abdominal:     Palpations: Abdomen is soft.     Tenderness: There is no abdominal tenderness.  Musculoskeletal:     Cervical back: Normal range of motion and neck supple.     Right lower leg: Edema present.     Left lower leg: Edema present.     Comments: Severe lymphedema  Skin:    General: Skin is warm and dry.  Neurological:     General: No focal deficit present.     Mental Status: She is alert and oriented to person, place, and time.  Psychiatric:        Mood and Affect: Mood is not depressed.        Behavior: Behavior normal.   Assessment & Plan:  1: Chronic heart failure with preserved ejection fraction without structural changes- - NYHA class III - euvolemic today - has attempted  to weigh daily with standing on 2 scales but it fluctuates too much - weight up 5 pounds from last visit here 2 weeks ago - limiting her sodium intake but occasionally will eat saltier foods  - understands to keep daily fluid intake to 60-64 ounces/ day - facial/mouth swelling with ACEi - will begin farxiga '10mg'$  daily; 30 day voucher provider - will check BMP next visit - saw cardiology Margarito Courser) 03/19/22 - BNP 10/31/21 was 123.9 - has received flu vaccine for this season - PharmD reconciled medications with the patient  2: HTN with stage 3 CKD- - BP looks good (134/88) - saw PCP Daryel Gerald) @ [facility]  - saw nephrology Holley Raring) 05/07/22  - continues to take metolazone twice weekly and occasionally doesn't take her bumex just to give her kidneys a break - BMP 05/16/22 reviewed and showed sodium 142, potassium 4.3, creatinine 1.02 and GFR >60 - check BMP/ CBC today due to recent hospitalization  3: Lymphedema- - saw wound center 06/14/22 - both lower legs currently wrapped  4: Sleep apnea- - wearing CPAP nightly   Medication  list reviewed.  Return in 1 month, sooner if needed.

## 2022-06-20 NOTE — Progress Notes (Signed)
[place] - PHARMACIST COUNSELING NOTE  Guideline-Directed Medical Therapy/Evidence Based Medicine  ACE/ARB/ARNI: N/A allergy to ACEI.  Beta Blocker:  None. Held due to hypotension Aldosterone Antagonist:  None Diuretic: Bumetanide 2 mg daily SGLT2i:  None  Adherence Assessment  Do you ever forget to take your medication? '[]'$ Yes '[x]'$ No  Do you ever skip doses due to side effects? '[]'$ Yes '[x]'$ No  Do you have trouble affording your medicines? '[]'$ Yes '[x]'$ No  Are you ever unable to pick up your medication due to transportation difficulties? '[]'$ Yes '[x]'$ No  Do you ever stop taking your medications because you don't believe they are helping? '[]'$ Yes '[x]'$ No  Do you check your weight daily? '[]'$ Yes '[x]'$ No   Adherence strategy: Patient states she is compliant with medications. Misses a few dose here and there, but takes them the next day or as soon as she remembers. Hard time remembering the twice weekly medications.   Barriers to obtaining medications: None at the moment.  Vital signs: HR 105, BP 134/8, weight (pounds) 398 (5 lb up from last visit)  ECHO: Date 10/2021, EF 60-65, notes  Left ventricular ejection fraction, by estimation, is 60 to 65%. The left ventricle has normal function. The left ventricle has no regional wall motion abnormalities. Left ventricular diastolic parameters were normal.       Latest Ref Rng & Units 05/16/2022    6:54 AM 05/15/2022    6:48 AM 05/14/2022    5:30 AM  BMP  Glucose 70 - 99 mg/dL 91  91  85   BUN 8 - 23 mg/dL '19  19  23   '$ Creatinine 0.44 - 1.00 mg/dL 1.02  1.14  1.24   Sodium 135 - 145 mmol/L 142  140  138   Potassium 3.5 - 5.1 mmol/L 4.3  4.2  3.9   Chloride 98 - 111 mmol/L 107  106  104   CO2 22 - 32 mmol/L '30  29  29   '$ Calcium 8.9 - 10.3 mg/dL 8.6  8.6  8.2     Past Medical History:  Diagnosis Date   Arthritis    Asthma    CHF (congestive heart failure) ([facility])    Enlarged heart    Hypertension     Lymphedema    Sleep apnea    Spinal stenosis     ASSESSMENT 66 year old female who presents to the HF clinic for a follow up. Patient states she is doing well overall. A RN that was follow up with her, recommended to stop taking midodrine as her systolic blood pressure was > 150. Pt states she has not had a UTI in years and cleans herself frequently. Pt cannot take weights daily, due to not being able to get both feet on the scale. Pt's weight was up this visit, but pt states she had a big dinner during thanksgiving and probably contributed to it.   Recent ED Visit (past 6 months): Date - 05/10/2022, [facility] - worsening pain swelling weeping drainage from the right leg.   PLAN CHF/HTN:  Continue current medications (bumex + metolazone BIW). Recommend starting farxiga 10 mg daily. Co-pay $0.00.   CKD:  Recommend starting farxiga.     Time spent: 30 minutes  Oswald Hillock, Pharm.D. Clinical Pharmacist 06/20/2022 11:03 AM    Current Outpatient Medications:    acetaminophen (TYLENOL) 500 MG tablet, Take 500-1,000 mg by mouth every 6 (six) hours as needed for mild pain or fever., Disp: , Rfl:  albuterol (VENTOLIN HFA) 108 (90 Base) MCG/ACT inhaler, Inhale 1-2 puffs into the lungs every 6 (six) hours as needed for wheezing or shortness of breath., Disp: , Rfl:    bumetanide (BUMEX) 2 MG tablet, Take 1 tablet (2 mg total) by mouth daily., Disp: 30 tablet, Rfl: 5   cephALEXin (KEFLEX) 500 MG capsule, Take 2 capsules (1,000 mg total) by mouth daily. Hold these prophylactic antibiotics while you are taking the other antibiotics from hospital (Patient taking differently: Take 500 mg by mouth 2 (two) times daily. Hold these prophylactic antibiotics while you are taking the other antibiotics from hospital), Disp: , Rfl:    cetirizine (ZYRTEC) 10 MG tablet, Take 1 tablet (10 mg total) by mouth daily. (Patient taking differently: Take 10 mg by mouth daily as needed for allergies.), Disp: 60 tablet,  Rfl: 0   cyanocobalamin 1000 MCG tablet, Take 1,000 mcg by mouth every other day., Disp: , Rfl:    metolazone (ZAROXOLYN) 5 MG tablet, Take 0.5 tablets (2.5 mg total) by mouth 2 (two) times a week. (Patient taking differently: Take 2.5 mg by mouth 2 (two) times a week. Tues and sat), Disp: 12 tablet, Rfl: 3   NYSTATIN powder, Apply 1 Application topically 2 (two) times daily as needed (Yeast Skin infection)., Disp: , Rfl:    potassium chloride SA (KLOR-CON M) 20 MEQ tablet, Take 2 tablets (40 mEq total) by mouth daily., Disp: 180 tablet, Rfl: 3   senna-docusate (SENOKOT-S) 8.6-50 MG tablet, Take 1 tablet by mouth 2 (two) times daily as needed for mild constipation or moderate constipation., Disp: , Rfl:    COUNSELING POINTS/CLINICAL PEARLS    DRUGS TO CAUTION IN HEART FAILURE  Drug or Class Mechanism  Analgesics NSAIDs COX-2 inhibitors Glucocorticoids  Sodium and water retention, increased systemic vascular resistance, decreased response to diuretics   Diabetes Medications Metformin Thiazolidinediones Rosiglitazone (Avandia) Pioglitazone (Actos) DPP4 Inhibitors Saxagliptin (Onglyza) Sitagliptin (Januvia)   Lactic acidosis Possible calcium channel blockade   Unknown  Antiarrhythmics Class I  Flecainide Disopyramide Class III Sotalol Other Dronedarone  Negative inotrope, proarrhythmic   Proarrhythmic, beta blockade  Negative inotrope  Antihypertensives Alpha Blockers Doxazosin Calcium Channel Blockers Diltiazem Verapamil Nifedipine Central Alpha Adrenergics Moxonidine Peripheral Vasodilators Minoxidil  Increases renin and aldosterone  Negative inotrope    Possible sympathetic withdrawal  Unknown  Anti-infective Itraconazole Amphotericin B  Negative inotrope Unknown  Hematologic Anagrelide Cilostazol   Possible inhibition of PD IV Inhibition of PD III causing arrhythmias  Neurologic/Psychiatric Stimulants Anti-Seizure  Drugs Carbamazepine Pregabalin Antidepressants Tricyclics Citalopram Parkinsons Bromocriptine Pergolide Pramipexole Antipsychotics Clozapine Antimigraine Ergotamine Methysergide Appetite suppressants Bipolar Lithium  Peripheral alpha and beta agonist activity  Negative inotrope and chronotrope Calcium channel blockade  Negative inotrope, proarrhythmic Dose-dependent QT prolongation  Excessive serotonin activity/valvular damage Excessive serotonin activity/valvular damage Unknown  IgE mediated hypersensitivy, calcium channel blockade  Excessive serotonin activity/valvular damage Excessive serotonin activity/valvular damage Valvular damage  Direct myofibrillar degeneration, adrenergic stimulation  Antimalarials Chloroquine Hydroxychloroquine Intracellular inhibition of lysosomal enzymes  Urologic Agents Alpha Blockers Doxazosin Prazosin Tamsulosin Terazosin  Increased renin and aldosterone  Adapted from Page Carleene Overlie, et al. "Drugs That May Cause or Exacerbate Heart Failure: A Scientific Statement from the American Heart  Association." Circulation 2016; 134:e32-e69. DOI: 10.1161/CIR.0000000000000426   MEDICATION ADHERENCES TIPS AND STRATEGIES Taking medication as prescribed improves patient outcomes in heart failure (reduces hospitalizations, improves symptoms, increases survival) Side effects of medications can be managed by decreasing doses, switching agents, stopping drugs, or adding additional therapy. Please let  someone in the Heart Failure Clinic know if you have having bothersome side effects so we can modify your regimen. Do not alter your medication regimen without talking to [place].  Medication reminders can help patients remember to take drugs on time. If you are missing or forgetting doses you can try linking behaviors, using pill boxes, or an electronic reminder like an alarm on your phone or an app. Some people can also get automated phone calls as medication  reminders.

## 2022-06-20 NOTE — Telephone Encounter (Signed)
Called patient and reviewed CBC/ BMP results. Potassium is slightly low so advised her to take an additional 55mq potassium today and tomorrow. This is in addition to the 490m that she already takes. She verbalized understanding.

## 2022-06-20 NOTE — TOC Benefit Eligibility Note (Signed)
Patient Advocate Encounter  Insurance verification completed.    The patient is currently admitted and upon discharge could be taking Farxiga 10 mg.  The current 30 day co-pay is $0.00.   The patient is currently admitted and upon discharge could be taking Jardiance 10 mg.  The current 30 day co-pay is $0.00.   The patient is insured through [employer] Part D   Ilka Lovick, CPHT Pharmacy Patient Advocate Specialist [facility] Patient Advocate Team Direct Number: [phone]  Fax: [phone]       

## 2022-06-20 NOTE — Patient Instructions (Addendum)
If you have voicemail, please make sure your mailbox is cleaned out so that we may leave a message and please make sure to listen to any voicemails.    If you receive a satisfaction survey regarding the Heart Failure Clinic, please take the time to fill it out. This way we can continue to provide excellent care and make any changes that need to be made.    Start taking farxiga as 1 tablet every morning. Prescription and coupon was sent to [facility].

## 2022-06-28 ENCOUNTER — Encounter: Payer: Medicare HMO | Admitting: Physician Assistant

## 2022-06-28 DIAGNOSIS — E11622 Type 2 diabetes mellitus with other skin ulcer: Secondary | ICD-10-CM | POA: Diagnosis not present

## 2022-06-28 NOTE — Progress Notes (Addendum)
[name], [name] ([id number]) [id number] Page 1 of 14 Visit Report for 06/28/2022 Chief Complaint Document Details Patient Name: Date of Service: [name], [name] 06/28/2022 12:45 PM Medical Record Number: [id number] Patient Account Number: [id number] Date of Birth/Sex: Treating RN: 1956-03-10 (66 y.o. [place] Primary Care Provider: Ranae Plumber Other Clinician: Massie Kluver Referring Provider: Treating Provider/Extender: Skeet Simmer in Treatment: 77 Information Obtained from: Patient Chief Complaint Bilateral LE lymphedema with Left LE Ulcers Electronic Signature(s) Signed: 06/28/2022 1:37:44 PM By: Worthy Keeler PA-C Entered By: Worthy Keeler on 06/28/2022 13:37:43 -------------------------------------------------------------------------------- HPI Details Patient Name: Date of Service: [name], [name] 06/28/2022 12:45 PM Medical Record Number: [id number] Patient Account Number: [id number] Date of Birth/Sex: Treating RN: Sep 10, 1955 (66 y.o. [place] Primary Care Provider: Ranae Plumber Other Clinician: Massie Kluver Referring Provider: Treating Provider/Extender: Skeet Simmer in Treatment: 43 History of Present Illness HPI Description: The patient is a 66 year old female with history of hypertension and a long-standing history of bilateral lower extremity lymphedema (first presented on 4/2) . She has had open ulcers in the past which have always responded to compression therapy. She had briefly been to a lymphedema clinic in the past which helped her at the time. this time around she stopped treatment of her lymphedema pumps approximately 2 weeks ago because of some pain in the knees and then noticed the right leg getting worse. She was seen by her PCP who put her on clindamycin 4 times a day 2 days ago. The patient has seen [facility] and Dr. Delana Meyer had seen her last year where a vascular  study including venous and arterial duplex studies were within normal limits. he had recommended compression stockings and lymphedema pumps and the patient has been using this in about 2 weeks ago. She is known to be diabetic but in the past few time she's gone to her primary care doctor her hemoglobin A1c has been normal. 02/11/2015 - after her last visit she took my advice and went to the ER regarding the progressive cellulitis of her right lower extremity and she was admitted between July 17 and 22nd. She received IV antibiotics and then was sent home on a course of steroid-induced and oral antibiotics. She has improved much since then. 02/17/2015 -- she has been doing fine and the weeping of her legs has remarkably gone down. She has no fresh issues. [name], [name] ([id number]) [phone].pdf Page 2 of 14 01/15/18 This patient was given this clinic before most recently in 2016 seen by Dr. Con Memos. She has massive bilateral lymphedema and over the last 2 months this had weeping edema out of the left leg. She has compression pumps but her compliance with these has been minimal. She has [facility] they've been using TCA/ABDs/kerlix under an Ace wrap.she has had recent problems with cellulitis. She was apparently seen in the ER and 12/23/17 and given clindamycin. She was then followed by her primary doctor and given doxycycline and Keflex. The pain seems to have settled down. In April 2018 the patient had arterial studies done at [facility]. This showed triphasic waveforms throughout the right leg and mostly triphasic waveforms on the left except for monophasic at the posterior tibial artery distally. She was not felt to have evidence of right lower extremity arterial stenosis or significant problems on the left side. 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 She also had a right lower extremity  venous Doppler in  January 2018 this was limited by the patient's body habitus and lymphedema. Most of the proximal veins were not visualized The patient presents with an area of denuded skin on the anterior medial part of the left calf. There is weeping edema fluid here. 01/22/18; the patient has somewhat better edema control using her compression pumps twice a day and as a result she has much better epithelialization on the left anterior calf area. Only a small open area remains. 01/29/18; the patient has been compliant with her compression pumps. Both the areas on her calf that healed. The remaining area on the left anterior leg is fully epithelialized Readmission: 02/20/2019 upon evaluation today patient presents for reevaluation due to issues that she is having with the bilateral lower extremities. She actually has wounds open on both legs. On the right she has an area in the crease of her leg on the right around the knee region which is actually draining quite a bit and actually has some fungal type appearance to it. She has been on nystatin powder that seems to have helped to some degree. In regard to the left lower extremity this is actually in the lower portion of her leg closer to the ankle and again is continuing to drain as well unfortunately. There does not appear to be any signs of active infection at this time which is good news. No fevers, chills, nausea, vomiting, or diarrhea. She tells me that since she was seen last year she is actually been doing quite well for the most part with regard to her lower extremities. Unfortunately she now is experiencing a little bit more drainage at this time. She is concerned about getting this under control so that it does not get significantly worse. 02/27/2019 on evaluation today patient appears to be doing somewhat better in regard to her bilateral lower extremity wounds. She has been tolerating the dressing changes without complication. Fortunately there is no signs of  active infection at this point. No fevers, chills, nausea, vomiting, or diarrhea. She did get her dressing supplies which is excellent news she was extremely excited to get these. She also got paperwork from [employer] for their financial assistance program where they may be able to help her out in the future if needed with supplies at discounted prices. 03/06/2019 on evaluation today patient appears to be doing a little worse with regard to both areas of weeping on her bilateral lower extremities. This is around the right medial knee and just above the left ankle. With that being said she is unfortunately not doing as well as I would like to see. I feel like she may need to potentially go see someone at the lymphedema clinic as the wraps that she needs or even beyond what we can do here at the wound care center. She really does not have wounds she just has open areas of weeping that are causing some difficulty for her. Subsequently because of this and the moisture I am concerned about the potential for infection I am going to likely give her a prophylactic antibiotic today, Keflex, just to be on the safe side. Nonetheless again there is no obvious signs of active infection at this time. 03/13/2019 on evaluation today patient appears to be doing well with regard to her bilateral lower extremities where she has been weeping compared to even last week's evaluation. I see some areas of new skin growth which is excellent and overall I am very pleased with how things  seem to be progressing. No fevers, chills, nausea, vomiting, or diarrhea. 03/20/2019 on evaluation today patient unfortunately is continuing to have issues with significant edema of the left lower extremity. Her right side seems to be doing much better. Unfortunately her left side is showing increased weeping of the lower portion of her leg. This is quite unfortunate obviously we were hoping to get her into the lymphedema clinic they really do not seem  to when I see her how if she is draining. Despite the fact this is really not wound related but more lymphedema weeping related. Nonetheless I do not know that this can be helpful for her to even go for that appointment since again I am not sure there is much that they would actually do at this point. We may need to try a 4 layer compression wrap as best we can on her leg. She is on the Augmentin currently although I am still concerned about whether or not there could be potentially something going on infection wise I would obtain a culture though I understand is not the best being that is a surface culture I just 1 to make sure I do not seem to be missing anything. 03/27/2019 on evaluation today patient appears to be doing much better in regard to the left lower extremity compared to last week. Last week she had tremendous weeping which I think was subsequent to infection now she seems to be doing much better and very pleased. This is not completely healed but there is a lot of new skin growth and it has dried out quite a bit. Overall I think that we are doing well with how things are moving along at this time. No fevers, chills, nausea, vomiting, or diarrhea. 04/03/2019 evaluation today patient appears to be doing a little worse this week compared to last time I saw her. I think this may be due to the fact that she is having issues with not being able to sleep in her bed at least not until last night. She is therefore been in a lift chair and subsequently has also had issues with not been able to use her pumps since she could not get in bed. With that being said the patient overall seems to be doing okay I do think I may want extend the antibiotic for a little bit longer at least until we can see if her edema and her weeping gets better and if it is then obviously I can always discontinue the antibiotics as of next week however I want her to continue to have it over the next week. 04/10/2019 on  evaluation today patient unfortunately is still doing poorly with regard to her left lower extremity. Her right is all things considering doing fairly well. On the left however she continues to have spreading of the area of infection and weeping which appears to be even a larger surface area than noted last week. She did have a positive culture for Pseudomonas in particular which seems to have been of concern she still has green/yellow discharge consistent with Pseudomonas and subsequently a tremendous amount of it. This has me obviously still concerned about the infection not really clearing up despite the fact that on culture it appears the Cipro should have been a good option for treating this. I think she may at this point need IV antibiotics since things are not doing better I do not want to get worse and cause sepsis. She is in agreement with the plan and  believes as well that she likely does need to go to the hospital for IV vancomycin. Or something of the like depending on what the recommendation is from the ER. 04/17/2019 on evaluation today patient appears to be doing excellent in regard to her lower extremity on the left. She was in the hospital for several days from when I sent her last we saw her until just this past Tuesday. Fortunately her drainage is significantly improved and in fact is mostly clear. There is just a couple small areas that may still drain a little bit she states that the [facility] [employer] they prescribed for her at discharge she went picked up from pharmacy and got home but has not been able to find it since. She is looked everywhere. She is wondering if I will replace that for her today I will be more than happy to do that. 05/01/2019 on evaluation today patient actually appears to be doing quite well with regard to her lower extremities. She occasionally is having areas that will leak and then heal up mainly when a piece of the fibrotic skin pops off but fortunately she is not  having any signs of active infection at this time. Overall she also really does not have any obvious weeping at this time. I do believe however she really needs some compression wraps and I think this may be a good time to get her back to the lymphedema clinic. 05/11/2019 on evaluation today patient actually appears to be doing quite well with regard to her bilateral lower extremities. She occasionally will have a small area that we per another but in general seems to be completely healed which is great news. Overall very pleased with how everything seems to be progressing. She does have her appointment with lymphedema clinic on November 18. 05/25/2019 on evaluation today patient appears to be doing well with regard to her left lower extremity. I am very pleased in this regard. In regard to her right leg this actually did start draining more I think it is mainly due to the fact that her leg is more swollen. I am not seeing any obvious signs of infection at this time although that is definitely something were obviously acutely aware of simply due to the fact that she had an issue not too far back with exactly this issue. Nonetheless I do feel like that lymphedema clinic would still be beneficial for her. I explained obviously if they are not able to do anything treatment wise on the right leg we could at least have them treat her left leg and then proceed from there. The patient is really in agreement with that plan. If they are able to do both as the drainage slows down that I would be happy to let them handle both. [name], [name] ([id number]) [id number] Page 3 of 14 06/01/2019 on evaluation today patient unfortunately appears to be doing worse with regard to her right lower extremity. The left lower extremity is still maintaining at this point. Unfortunately she has been having significantly increased pain over the past several days and has been experiencing as  well increased swelling of the right lower extremity. I really do not know that I am seeing anything that appears to be obvious for infection at this point to be peripherally honest. With that being said the patient does seem to be having much more swelling that she is even experienced in the past and coupled with increased pain in her hip as well I am concerned that  again she could potentially have a DVT although I am not 100% sure of this. I think it something that may need to be checked out. We discussed the possibility of sending her for a DVT study through the hospital but unfortunately transportation is an issue if she does have a DVT I do not want her to wait days to be able to get in for that test however if she has this scheduled as an outpatient that is as fast that she will be able to get the test scheduled for transportation purposes. That will also fall on Thanksgiving so subsequently she did actually be looking at either Friday or even next week before we would know anything back from this. That is much too long in my opinion. Subsequent to the amount of discomfort she is experiencing the patient is actually okay with going to the ER for evaluation today. 06/12/2019 on evaluation today patient actually appears to be doing significantly better compared to last time I saw her. Following when I last saw her she was actually in the hospital from that Monday until the following Sunday almost 1 full week. She actually was placed on Keflex in the hospital following the time for her to be discharged and Dr. Steva Ready has recommended 2 times a day dosing of the Keflex for the next year in order to help with more prophylactic/preventative measures with regard to her developing cellulitis. Overall I think this sounds like an excellent plan. The patient unfortunately is good to have trouble being treated at lymphedema clinic due to the fact that she really cannot get up on the bed that they have  there. They also state that they cannot manage her as long as she has anything draining at this point. Obviously that is somewhat unfortunate as she does need help with edema control but nonetheless we will have to do what we can for her outside of it sounds like the lymphedema clinic scenario at this point. 06/19/2019 on evaluation today patient appears to be doing fairly well with regard to her bilateral lower extremities. She is not nearly as swollen and shows no signs of infection at this point. There is no evidence of cellulitis whatsoever. She also has no open wounds or draining at this point which is also good news. No fever chills noted. She seems to be in very good spirits and in fact appears to be doing quite well. READMISSION 11/27/2019 This is a 66 year old woman that we have had in this clinic several times before including 2015, 16 and 19 and then most recently from 03/20/2019 through 06/19/2019 with bilateral lower extremity lymphedema. She has had previous arterial and reflux studies done years ago which were not all that remarkable. 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 She does have a positive family history and she had large legs starting may be in her 52s. She was recently in hospital from 10/20/2019 through 10/28/2019 with right leg cellulitis. She was given Ancef and clindamycin and then Zosyn when a culture showed Pseudomonas. At that time there was purulent drainage. She was followed by infectious disease Dr Steva Ready. The patient is now back at home. She has noted increased swelling in the right and no drainage in her right leg mostly on the posterior medial aspect in the calf area. She has not had pain or fever. She has literally been improved lysing above dressings because her at the area of this is far too large  for standard compression. She has been wrapping the areas with sheets to resorptive pads. She is found these  helped somewhat. She does have an appointment with the lymphedema clinic in [place] in late June. Past medical history includes bilateral lymphedema, hypertension, obstructive sleep apnea with CPAP. Recent hospitalization with apparently Pseudomonas cellulitis of the right lower leg 12/15/2019 upon evaluation today patient appears to be doing a little bit worse in regard to her right lower extremity. Unfortunately she is having more weeping down in the lower portion of her leg. Fortunately there is no signs of active infection at this time. No fever chills noted. The patient states she is not having increased pain except for when she attempted to use the lymphedema pumps unfortunately she states that she did have pain when she did this. Otherwise we been using absorptive dressings of one type or another she is using diapers at home and then subsequently Ace wraps. In regard to the barrier cream we have discussed the possibility of derma cloud which she would like to try I do not have a problem with that. 12/22/2019 upon evaluation today patient actually appears to be doing better in regard to her leg ulcers at this point. Fortunately there does not appear to be any signs of active infection which is great news and I am extremely pleased with where things are progressing at this time. There is no sign of active infection currently. The patient is very pleased to see things doing so well. 12/29/2019 upon evaluation today patient appears to be doing a little bit better in regard to her weeping in general over her lower extremities. She does have some signs of mild erythema little bit more than what I noted last week or rather last visit. Nonetheless I think that my threshold for switching her antibiotics from Keflex to something else is very low at this point considering that she has had such severe infections in the past that seem to come almost out of nowhere. There is a little erythema and warmth  noted of the lower portion of her leg compared to the upper which also makes me want to go ahead and address things more rapidly at this point. Likely I would switch out the Keflex for something like Levaquin ideally. 7/16; patient with severe bilateral lymphedema. She has superficial wounds albeit almost circumferential now on the left lateral lower leg. This may be new from last time. Small area on the right anterior lower leg and then another area on the right medial lower leg and of pannus fold. She has been using various absorptive garments. She states she is using her compression pumps once a day occasionally twice. Culture from her last visit here was negative 01/29/2020 on evaluation today patient appears to be doing excellent at this point in regard to her legs with regard to infection I see no signs of active infection at this point. She still does have unfortunately areas of weeping this is minimal on the right now her left is actually significantly worse although I do not think it is as bad as last week with Dr. Dellia Nims saw her. She has been trying to pump and elevate her legs is much as possible. She has previously been on the Keflex and in the past for prevention that seems to do fairly well and likely can extend that today. 02/04/2020 on evaluation today patient appears to be doing better in regard to her legs bilaterally. Fortunately there is no signs of active infection  at this time which is great news and overall she has less weeping on the left compared to the right and there is several spots where she is pretty much sealed up with no draining regions. Overall very pleased in this regard. 02/19/2020 on evaluation today patient appears to be doing very well in regard to her wounds currently. Fortunately there is no evidence of active infection overall very pleased with where things stand. She is significantly improved in regard to her edema I am extremely pleased in this regard she tells  me that the popping no longer hurts and in fact she actually looks forward to it. 03/04/2020 on evaluation today patient appears to be doing excellent in regard to her lower extremities. Fortunately there is no signs of active infection at this time. No fevers, chills, nausea, vomiting, or diarrhea. 03/25/2020 on evaluation today patient appears to be doing a little bit more poorly in regard to her legs at this point. She tells me that she is still continue to have issues with drainage and this has been a little bit worse she was getting ready to start taking the Keflex again but wanted to see me first. Fortunately there is no signs of active infection at this time. No fevers, chills, nausea, vomiting, or diarrhea. 04/15/2020 upon evaluation today patient appears to be doing somewhat poorly in regard to her right leg. She tells me she has been having more pain she has been taking the Keflex that was previously prescribed unfortunately that just does not seem to help with this. She was hoping that the pain on her right was actually coming from the fact that she was having issues with her wrap having gotten caught in her recliner. With that being said she tells me that she knew something was not right. Currently her right leg is warm to touch along with being erythematous all the way up to around at least mid thigh as far as I can see. The left leg does not appear to be doing that badly though there is increased weeping around the ankle region. 05/06/2020 on evaluation today patient appears to be doing much better than last time I saw her. She did go to the hospital where she was admitted for 2 days and treated with antibiotic therapy. She was discharged with antibiotics as well and has done extremely well. I am extremely pleased with where things stand today. There is no signs of active infection at this time which is great news. 05/27/2020 upon evaluation today patient appears to be doing well with  regard to her lower extremities bilaterally. She has just a very tiny area on the right leg which is opening on the left leg she is significantly improved though she still has several areas that do appear to be open this is minimal compared to what is been in the past. In general I am extremely pleased with where things stand today. The patient does tell me she is not been using her pumps quite as much as JADINE, BRUMLEY ([id number]) [id number] Page 4 of 14 she should be. I do believe that is 1 area she can definitely work on. She has had a lot going on including a Covid exposure and apparently also a outbreak of likely shingles. 06/24/2020 upon evaluation today patient appears to be doing well with regard to her legs in general although the left leg unfortunately is showing some signs of erythema she does have a little bit of increased weeping and to be  honest I am concerned about infection here. I discussed that with her today and I think that we may need to address this sooner rather than later she has been taking Keflex she is not really certain that is been making a big improvement however. No fevers, chills, nausea, vomiting, or diarrhea. 06/30/2020 upon evaluation today patient's legs actually seem to be doing better in my opinion as compared to where they were last week. Fortunately there does not appear to be any signs of active infection. Her culture showed multiple organisms nothing predominate. With that being said the Levaquin seems to have done well I think she has improved since I last saw her as well. 07/14/2020 upon evaluation today patient appears to be doing actually better in regard to her lower extremities in my opinion. She has been tolerating the dressing changes without complication. Fortunately there is no signs of active infection at this time. 08/04/2020 upon evaluation today patient appears to be doing excellent in regard to her leg ulcers.  Fortunately she has very little that is open at this point. This is great news. In fact I think that the Goldbond medicated powder has been excellent for her. It seems to have done the trick where we had tried several other things without as much success. Fortunately there is no evidence of active infection at this time. No fevers, chills, nausea, vomiting, or diarrhea. 09/01/2020 upon evaluation today patient appears to be doing a little bit more poorly than the last time I saw her. She tells me right now that she has been having a lot of drainage compared to where things were previous which has unfortunately led to more irritation as well. She is concerned that this is leading to infection. Again based on what I am seeing today as well I am also concerned of the same to be honest. 09/15/2020 upon evaluation today patient appears to be doing well with regard to her legs compared to what I saw previous. Fortunately there does not appear to be any evidence of active infection at this time which is great news. At least not as badly as what it was previous. With that being said I do believe that the patient does need to have an extension of the antibiotics. This I believe will actually help her more in the way of making sure this stays under control and does not worsen. That was the Bactrim DS. Readmission: 08/01/2021 upon evaluation today patient appears to be doing actually pretty well all things considered. Is actually been almost a year since have seen her last March. Fortunately I do not see any evidence of active infection locally nor systemically at this point. She does have a couple open areas on the left leg the right leg appears to be doing quite well. In general she has been in the hospital 3 times since I saw her a year ago all 4 cellulitis type issues except for the last 1 which was actually more related to congestive heart failure for that reason she is not using her lymphedema pumps at this  point and that is probably the best idea. 08/08/2021 upon evaluation today patient appears to be doing well with regard to her legs all things considered her left leg may be a little bit more swollen based on what I see today. Fortunately I do not see any signs of active infection locally nor systemically at this time which is great news. No fevers, chills, nausea, vomiting, or diarrhea. 08/15/2021 upon evaluation today  patient actually appears to be doing excellent in regard to her wounds. She has been tolerating the dressing changes without complication. Fortunately I see no evidence of infection currently which is great news. No fevers, chills, nausea, vomiting, or diarrhea. 08/29/2021 upon evaluation patient appears to be doing excellent she in fact is almost completely healed. I am actually very pleased with where we stand today and I think that she is making wonderful progress she just has a small area of weeping on the right lateral leg everything else is pretty much completely healed which is also. 09/05/2021 upon evaluation today patient appears to be doing well with regard to her legs. She does have a small area of weeping on the right leg and there is a small area on the left leg as well. It is potentially something that may need to be addressed in the future more specifically but right now I think that there may have just been a little drainage here on the left. With all that being said I think that this still does not appear to be infected and overall I feel like she is doing quite well. That something we always have to keep a very close eye on as she can change very rapidly from okay to not okay. 09/12/2021 upon evaluation today patient appears to be doing a little bit worse in regard to her leg and actually somewhat concerned about the possibility of infection here. I discussed that with the patient. For that reason I am going to go ahead and see about getting her set up for a repeat  prescription for the Bactrim. She is in agreement with that plan. 3/14; patient presents for follow-up. She has been using silver alginate with dressing changes. She is still taking Bactrim prescribed at last clinic visit. She has no issues or complaints today. She has lymphedema pumps however does not use these. 09/26/2020 upon evaluation today patient appears to actually be doing well in regard to her right leg I am pleased in that regard. Unfortunately she has new areas on her left leg that are open at this point that I do need to be addressed. I am also concerned about the warmth around one of the wounds on the more anterior side. I think this is something that we may need to culture today potentially requiring antibiotics going forward. 10/03/2021 upon evaluation today patient unfortunately is not doing nearly as well as she was even last week with her wounds. She is having some issues here with increased cellulitis of the left lower extremity unfortunately. I did prescribe Augmentin for her eczema prescribed last week unfortunately she never actually received this prescription. The pharmacy stated they never got it. We double checked with them today they still did not receive it. I am not sure what happened in that regard. With that being said the patient is in good spirits today she does not appear to be as sick as where she normally is when the cellulitis gets significantly worse as it has in the past. Nonetheless the leg is much more red not just around the ankle where I was seeing at last week on Tuesday but rather now this is going all the way up to almost her knee and is definitely hot to touch compared to the right leg. Nonetheless I feel like she does need to go to the ER for likely IV antibiotics. 10-10-2021 upon evaluation today patient appears to be doing well with regard to her wound. She has been  tolerating the dressing changes that appears to be doing much better. After I saw her last  week she did go to the ER they admitted her to the hospital and gave her IV antibiotics she fortunately is doing significantly better. Overall I am extremely pleased with where things stand today. 10-17-2021 upon evaluation today patient appears to be doing well with regard to her wound. In fact this is looking better and her leg is looking better but she still does have some areas here of erythema. We will continue to address this as soon as possible in my opinion. I do believe she may require some debridement and what appears to be a more defined wound on her left leg at this point. 10-24-2021 upon evaluation patient is definitely showing signs of improvement which is great news. I do not see any evidence of active infection locally or systemically which is great news as well. No fevers, chills, nausea, vomiting, or diarrhea. 10-31-2021. Upon evaluation today patient's leg actually feels better from an infection and wound care perspective. I am actually very pleased in this regard. Unfortunately the biggest issue that I see at this time is that the patient is having a significant issue with her breathing. I did put her on the pulse ox machine to check her oxygen saturation and it was pretty much at 100% which is good but she tells me that when she is walking this is much more difficult for her. She also tells me that when she is trying to bend over to perform her dressing changes she is so short of breath due to the swelling and fluid in her abdominal area that she is just not been able to do this. She is tearful and very concerned today to be honest. I am truly sorry to see her this way I know she is really having a hard time at the moment. 11-14-2021 upon evaluation patient actually appears to be doing significantly better compared to when I last saw her. She was actually admitted to the hospital I believe this for 6 days total after I sent her on 10-31-2021. Subsequently they actually pulled off greater  than 50 pounds of fluid she tells me she feels significantly better her legs are not as tight she actually has some play in the tissue and it is obvious that she is doing much better just from a mobility [name], [name] ([id number]) [id number] Page 5 of 14 standpoint as well as a breathing standpoint. Overall I am extremely happy for her and how she is doing the leg also looks much better on the left. 11-21-2021 upon evaluation today patient appears to be doing well although it does appear that she is going require some sharp debridement the wound is very dry this is the opposite of what its been she was having so much weeping that it was staying extremely wet. Nonetheless we do need to see about going ahead and debriding the wound today. 11-28-2021 upon evaluation today patient appears to be doing well with regard to her wound I definitely see signs of things continuing to improve which is great news. I do not see any evidence of active infection locally or systemically also great news. 12-05-2021 upon evaluation today patient appears to be doing well with regard to her wound. She has been tolerating the dressing changes. Fortunately there does not appear to be any signs of active infection locally or systemically at this time. No fevers, chills, nausea, vomiting, or diarrhea. 12-12-2021 upon evaluation  patient appears to be doing well with regard to her wound this is actually measuring smaller and looking much better. Fortunately I do not see any signs of active infection locally or systemically at this time which is excellent news. 6/13; small wound area in the folds of her lymphedema just above the left ankle. Fortunately the area looks quite good. She has been using silver alginate ABDs and an Ace wrap which she is able to change her self. She is not using her compression pumps out of fear that this could contribute to heart failure. 12-26-2021 upon evaluation patient's  wounds are actually showing signs of excellent improvement. I am very pleased with where things stand. Overall I think that she has been making excellent progress and I think we are very close to complete resolution which is great news. 01-11-2022 upon evaluation today patient appears to be doing well currently in regard to her legs in general although on the left leg she does have 1 area that still irritated and inflamed on the anterior portion of her leg. Fortunately I do not see any signs of systemic infection locally there might be some infection going on here however which is my biggest concern. Fortunately I do not see any evidence though of this spreading to any other location. 01-16-2022 upon evaluation today patient has actually showing signs of excellent improvement. Fortunately I do not see any evidence of infection locally or systemically which is great news and overall I am very pleased with where we stand at this point. 01-25-2022 upon evaluation today patient actually appears to be doing decently well in regard to her legs. She has been tolerating the dressing changes without complication. Fortunately there does not appear to be any evidence of active infection locally or systemically which is great news and overall I am extremely pleased with where we stand at this point. 02-13-2022 upon evaluation today patient appears to be doing well currently in regard to her wound which is actually showing signs of significant improvement. Fortunately I do not see any evidence of active infection locally or systemically at this time. No fevers, chills, nausea, vomiting, or diarrhea. 02-20-2022 upon evaluation today patient appears to be doing well currently in regard to her wound. She has been tolerating the dressing changes without complication. Fortunately there does not appear to be any signs of active infection locally or systemically at this time. No fevers, chills, nausea, vomiting,  or diarrhea. 02-27-2022 upon evaluation today patient appears to be doing excellent in regard to her wounds on the leg. She has been tolerating the dressing changes without complication this is very close to complete resolution. Fortunately I do not see any signs of infection locally or systemically at this time which is great news. No fevers, chills, nausea, vomiting, or diarrhea. 03-06-2022 upon evaluation today patient appears to be doing well currently in regard to her wound in general. She has been tolerating the dressing changes without complication. Fortunately I do not see any evidence of active infection locally or systemically at this time which is great news. I am concerned a little bit however about not really her wounds but the second toe right foot where there appears to be some cellulitis after she dropped a frying pan on this about 2 weeks ago. She tells me its been itching and burning and I do think that it is possible she may have an infection based on what we are seeing currently. 03-20-2022 upon evaluation today patient actually appears to be  doing much better at this point. Fortunately there does not appear to be any signs of active infection locally or systemically at this time which is great news. No fevers, chills, nausea, vomiting, or diarrhea. 03-27-2022 upon evaluation today patient appears to be doing somewhat poorly in regard to her legs compared to previous. Her right leg has a couple areas that are open and she is very warm and painful to touch around the lateral portion of the ankle area. Left leg is showing signs of little bit more drainage and weeping as well I think that this is probably due to the fact that she is swelling a lot more that she has been in the past. Fortunately I do not see any signs of active infection systemically though locally there is definitely some issues here. 04-03-2022 upon evaluation today patient appears to be doing poorly in regard to her  wounds on the legs unfortunately. She also seems to have more significant cellulitis that has ensued. Unfortunately she has been on doxycycline initially it seemed to be helping but as of this week that is no longer the case. It started getting worse last week and with the reinitiation of the doxycycline nothing has improved. Often when she gets like this IV antibiotics are necessary. 04-19-2022 upon evaluation today patient appears to be doing well currently in regard to her legs from an infection standpoint. She did get IV vancomycin in the hospital and states that her legs did get better when she left she was no longer weeping but she had been in the bed for 4 days straight. With that being said since coming home her legs have begun to weep again the original wound that we were dealing with has healed she does have some weeping over both lower extremities however which is still of concern. 05-03-2022 upon evaluation today patient appears to be doing well currently in regard to her left leg which I feel like is a little bit better unfortunately the right leg is still draining significantly. Fortunately I do not see any signs of infection locally or systemically at this time. No fevers, chills, nausea, vomiting, or diarrhea. 05-10-2022 unfortunately patient actually showed signs of increasing erythema today. I believe that the cellulitis never completely resolved from when she was in the hospital last. Again she has been on doxycycline previously by myself I gave her Keflex more recently as an ongoing prescription to try to prevent infection but again I do not think this is strong enough to be able to get this under control. I really feel like that she is probably can need IV antibiotics and a good period of time in the hospital to be able to keep her legs elevated and not having to get around to do a whole lot in order to allow this to resolve. The patient voiced understanding. 05-24-2022 upon  evaluation today patient appears to be doing excellent in regard to her legs. She has been tolerating the dressing changes without complication. Fortunately there does not appear to be any signs of infection at this time which is great news she has been on cefadroxil since she was discharged from the hospital. The day that we sent her she actually tells me that she ended up in septic shock that day and coded. When she first showed up they did not notice it on the lab work but said by the end of the day she had already undergone this and they had had to address the issue obviously when  she coded. She tells me that likely she would not be here today if she had not gone to the hospital that day. I explained to her that I do believe that was a God thing and it was just the perfect timing that he had for her at that time. 06-14-2022 upon evaluation today patient appears to be doing well currently in regard to her legs. In general I think she is making good progress and I do not see any signs of infection at this time which is great news. No fevers, chills, nausea, vomiting, or diarrhea. 06-28-2022 upon evaluation today patient appears to be doing excellent in regard to her legs. She is actually showing signs of excellent improvement I do not see any evidence of infection locally nor systemically at this time. No fevers, chills, nausea, vomiting, or diarrhea. Electronic Signature(s) [name], [name] ([id number]) [id number] Page 6 of 14 Signed: 06/28/2022 1:37:57 PM By: Worthy Keeler PA-C Entered By: Worthy Keeler on 06/28/2022 13:37:57 -------------------------------------------------------------------------------- Physical Exam Details Patient Name: Date of Service: [name], [name] 06/28/2022 12:45 PM Medical Record Number: [id number] Patient Account Number: [id number] Date of Birth/Sex: Treating RN: May 11, 1956 (66 y.o. [place]e [place] Primary Care Provider: Ranae Plumber Other Clinician: Massie Kluver Referring Provider: Treating Provider/Extender: Skeet Simmer in Treatment: 58 Constitutional Well-nourished and well-hydrated in no acute distress. Respiratory normal breathing without difficulty. Psychiatric this patient is able to make decisions and demonstrates good insight into disease process. Alert and Oriented x 3. pleasant and cooperative. Notes Upon inspection patient's wound bed actually showed signs of good granulation epithelization at this point. Fortunately I see no evidence of active infection at this point which is great news and overall I do believe that we are headed in the right direction here. Electronic Signature(s) Signed: 06/28/2022 1:38:15 PM By: Worthy Keeler PA-C Entered By: Worthy Keeler on 06/28/2022 13:38:15 -------------------------------------------------------------------------------- Physician Orders Details Patient Name: Date of Service: [name], [name] 06/28/2022 12:45 PM Medical Record Number: [id number] Patient Account Number: [id number] Date of Birth/Sex: Treating RN: 13-Oct-1955 (66 y.o. [place] Primary Care Provider: Ranae Plumber Other Clinician: Massie Kluver Referring Provider: Treating Provider/Extender: Skeet Simmer in Treatment: 105 Verbal / Phone Orders: No Diagnosis Coding Follow-up Appointments Return Appointment in 1 week. Nurse Visit as needed Bathing/ Shower/ Hygiene Clean wound with Normal Saline or wound cleanser. [name], [name] ([id number]) [id number] Page 7 of 14 Wash wounds with antibacterial soap and water. May shower; gently cleanse wound with antibacterial soap, rinse and pat dry prior to dressing wounds No tub bath. Edema Control - Lymphedema / Segmental Compressive Device / Other A wraps ce Elevate, Exercise Daily and A void Standing for Long Periods of Time. Elevate leg(s) parallel to the  floor when sitting. DO YOUR BEST to sleep in the bed at night. DO NOT sleep in your recliner. Long hours of sitting in a recliner leads to swelling of the legs and/or potential wounds on your backside. Other: - use lymphedema pumps Non-Wound Condition Bilateral Lower Extremities Cleanse affected area with antibacterial soap and water, pply appropriate compression. - use abdominal pads them ace wrap to secure A dditional non-wound orders/instructions: - silver cell to open weeping areas A Electronic Signature(s) Signed: 06/28/2022 4:23:52 PM By: Worthy Keeler PA-C Signed: 06/29/2022 9:48:27 AM By: Massie Kluver Entered By: Massie Kluver on 06/28/2022 13:19:44 -------------------------------------------------------------------------------- Problem List Details Patient Name: Date of Service: [name] Matte. 06/28/2022 12:45 PM  Medical Record Number: [id number] Patient Account Number: [id number] Date of Birth/Sex: Treating RN: [birth year] (66 y.o. 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 [place] Primary Care Provider: Ranae Plumber Other Clinician: Massie Kluver Referring Provider: Treating Provider/Extender: Skeet Simmer in Treatment: 51 Subjective Chief Complaint Information obtained from Patient Bilateral LE lymphedema with Left LE Ulcers History of Present Illness (HPI) The patient is a 66 year old female with history of hypertension and a long-standing history of bilateral lower extremity lymphedema (first presented on 4/2) . She has had open ulcers in the past which have always responded to compression therapy. She had briefly been to a lymphedema clinic in the past which helped her at the time. this time around she stopped treatment of her lymphedema pumps approximately 2 weeks ago because of some pain in the knees and then noticed the right leg getting worse. She was seen by her PCP who put her on clindamycin 4 times a day 2 days ago. The patient has seen [facility] and Dr. Delana Meyer had seen her last year where a vascular study including venous and arterial duplex studies were within normal limits. he had recommended compression stockings and lymphedema pumps and the patient has been using this in about 2 weeks ago. She is known to be diabetic but in the past few time she's gone to her primary care doctor her hemoglobin A1c has been normal. 02/11/2015 - after her last visit she took my advice and went to the ER regarding the progressive cellulitis of her right lower extremity and she was admitted between July 17 and 22nd. She received IV antibiotics and then was sent home on a course of steroid-induced and oral antibiotics. She has improved much since then. 02/17/2015 -- she has been doing fine and the weeping of her legs has remarkably gone down. She has no fresh issues. READMISSION 01/15/18 This patient  was given this clinic before most recently in 2016 seen by Dr. Con Memos. She has massive bilateral lymphedema and over the last 2 months this had weeping edema out of the left leg. She has compression pumps but her compliance with these has been minimal. She has [facility] they've been using TCA/ABDs/kerlix under an Ace wrap.she has had recent problems with cellulitis. She was apparently seen in the ER and 12/23/17 and given clindamycin. She was then followed by her primary doctor and given doxycycline and Keflex. The pain seems to have settled down. In April 2018 the patient had arterial studies done at [facility]. This showed triphasic waveforms throughout the right leg and mostly triphasic waveforms on the left except for monophasic at the posterior tibial artery distally. She was not felt to have evidence  of right lower extremity arterial stenosis or significant problems on the left side. 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 She also had a right lower extremity venous Doppler in January 2018 this was limited by the patient's body habitus and lymphedema. Most of the proximal veins were not visualized The patient presents with an area of denuded skin on the anterior medial part of the left calf. There is weeping edema fluid here. 01/22/18; the patient has somewhat better edema control using her compression pumps twice a day and as a result she has much better epithelialization on the left anterior calf area. Only a small open area remains. 01/29/18; the patient has been compliant with her compression pumps. Both the areas on her calf that healed. The remaining area on the left anterior leg is fully epithelialized Readmission: 02/20/2019 upon evaluation today patient presents for reevaluation due to issues that she is having with the bilateral lower extremities. She actually has wounds open on both legs. On the right she has an area in the crease of  her leg on the right around the knee region which is actually draining quite a bit and actually has some fungal type appearance to it. She has been on nystatin powder that seems to have helped to some degree. In regard to the left lower extremity this is actually in the lower portion of her leg closer to the ankle and again is continuing to drain as well unfortunately. There does not appear to be any signs of active infection at this time which is good news. No fevers, chills, nausea, vomiting, or diarrhea. She tells me that since she was seen last year she is actually been doing quite well for the most part with regard to her lower extremities. Unfortunately she now is experiencing a little bit more drainage at this time. She is concerned about getting this under control so that it does not get significantly worse. 02/27/2019 on evaluation today patient appears to be doing somewhat better in regard to her bilateral lower extremity wounds. She has been tolerating the dressing changes without complication. Fortunately there is no signs of active infection at this point. No fevers, chills, nausea, vomiting, or diarrhea. She did get her dressing supplies which is excellent news she was extremely excited to get these. She also got paperwork from [employer] for their financial assistance program where they may be able to help her out in the future if needed with supplies at discounted prices. 03/06/2019 on evaluation today patient appears to be doing a little worse with regard to both areas of weeping on her bilateral lower extremities. This is around the right medial knee and just above the left ankle. With that being said she is unfortunately not doing as well as I would like to see. I feel like she may need to potentially go see someone at the lymphedema clinic as the wraps that she needs or even beyond what we can do here at the wound care center. She really does not have wounds she just has open areas of  weeping that are causing some difficulty for her. Subsequently because of this and the moisture I am concerned about the potential for infection I am going to likely give her a prophylactic antibiotic today, Keflex, just to be on the safe side. Nonetheless again there is no obvious signs of active infection at this time. 03/13/2019 on evaluation today patient appears to be doing well with regard to her bilateral lower extremities  where she has been weeping compared to even last QUYEN, CUTSFORTH ([id number]) [id number] Page 9 of 14 week's evaluation. I see some areas of new skin growth which is excellent and overall I am very pleased with how things seem to be progressing. No fevers, chills, nausea, vomiting, or diarrhea. 03/20/2019 on evaluation today patient unfortunately is continuing to have issues with significant edema of the left lower extremity. Her right side seems to be doing much better. Unfortunately her left side is showing increased weeping of the lower portion of her leg. This is quite unfortunate obviously we were hoping to get her into the lymphedema clinic they really do not seem to when I see her how if she is draining. Despite the fact this is really not wound related but more lymphedema weeping related. Nonetheless I do not know that this can be helpful for her to even go for that appointment since again I am not sure there is much that they would actually do at this point. We may need to try a 4 layer compression wrap as best we can on her leg. She is on the Augmentin currently although I am still concerned about whether or not there could be potentially something going on infection wise I would obtain a culture though I understand is not the best being that is a surface culture I just 1 to make sure I do not seem to be missing anything. 03/27/2019 on evaluation today patient appears to be doing much better in regard to the left lower extremity compared to  last week. Last week she had tremendous weeping which I think was subsequent to infection now she seems to be doing much better and very pleased. This is not completely healed but there is a lot of new skin growth and it has dried out quite a bit. Overall I think that we are doing well with how things are moving along at this time. No fevers, chills, nausea, vomiting, or diarrhea. 04/03/2019 evaluation today patient appears to be doing a little worse this week compared to last time I saw her. I think this may be due to the fact that she is having issues with not being able to sleep in her bed at least not until last night. She is therefore been in a lift chair and subsequently has also had issues with not been able to use her pumps since she could not get in bed. With that being said the patient overall seems to be doing okay I do think I may want extend the antibiotic for a little bit longer at least until we can see if her edema and her weeping gets better and if it is then obviously I can always discontinue the antibiotics as of next week however I want her to continue to have it over the next week. 04/10/2019 on evaluation today patient unfortunately is still doing poorly with regard to her left lower extremity. Her right is all things considering doing fairly well. On the left however she continues to have spreading of the area of infection and weeping which appears to be even a larger surface area than noted last week. She did have a positive culture for Pseudomonas in particular which seems to have been of concern she still has green/yellow discharge consistent with Pseudomonas and subsequently a tremendous amount of it. This has me obviously still concerned about the infection not really clearing up despite the fact that on culture it appears the Cipro should have been a  good option for treating this. I think she may at this point need IV antibiotics since things are not doing better I do not  want to get worse and cause sepsis. She is in agreement with the plan and believes as well that she likely does need to go to the hospital for IV vancomycin. Or something of the like depending on what the recommendation is from the ER. 04/17/2019 on evaluation today patient appears to be doing excellent in regard to her lower extremity on the left. She was in the hospital for several days from when I sent her last we saw her until just this past Tuesday. Fortunately her drainage is significantly improved and in fact is mostly clear. There is just a couple small areas that may still drain a little bit she states that the [facility] they prescribed for her at discharge she went picked up from pharmacy and got home but has not been able to find it since. She is looked everywhere. She is wondering if I will replace that for her today I will be more than happy to do that. 05/01/2019 on evaluation today patient actually appears to be doing quite well with regard to her lower extremities. She occasionally is having areas that will leak and then heal up mainly when a piece of the fibrotic skin pops off but fortunately she is not having any signs of active infection at this time. Overall she also really does not have any obvious weeping at this time. I do believe however she really needs some compression wraps and I think this may be a good time to get her back to the lymphedema clinic. 05/11/2019 on evaluation today patient actually appears to be doing quite well with regard to her bilateral lower extremities. She occasionally will have a small area that we per another but in general seems to be completely healed which is great news. Overall very pleased with how everything seems to be progressing. She does have her appointment with lymphedema clinic on November 18. 05/25/2019 on evaluation today patient appears to be doing well with regard to her left lower extremity. I am very pleased in this regard. In regard to  her right leg this actually did start draining more I think it is mainly due to the fact that her leg is more swollen. I am not seeing any obvious signs of infection at this time although that is definitely something were obviously acutely aware of simply due to the fact that she had an issue not too far back with exactly this issue. Nonetheless I do feel like that lymphedema clinic would still be beneficial for her. I explained obviously if they are not able to do anything treatment wise on the right leg we could at least have them treat her left leg and then proceed from there. The patient is really in agreement with that plan. If they are able to do both as the drainage slows down that I would be happy to let them handle both. 06/01/2019 on evaluation today patient unfortunately appears to be doing worse with regard to her right lower extremity. The left lower extremity is still maintaining at this point. Unfortunately she has been having significantly increased pain over the past several days and has been experiencing as well increased swelling of the right lower extremity. I really do not know that I am seeing anything that appears to be obvious for infection at this point to be peripherally honest. With that being said the patient  does seem to be having much more swelling that she is even experienced in the past and coupled with increased pain in her hip as well I am concerned that again she could potentially have a DVT although I am not 100% sure of this. I think it something that may need to be checked out. We discussed the possibility of sending her for a DVT study through the hospital but unfortunately transportation is an issue if she does have a DVT I do not want her to wait days to be able to get in for that test however if she has this scheduled as an outpatient that is as fast that she will be able to get the test scheduled for transportation purposes. That will also fall on Thanksgiving  so subsequently she did actually be looking at either Friday or even next week before we would know anything back from this. That is much too long in my opinion. Subsequent to the amount of discomfort she is experiencing the patient is actually okay with going to the ER for evaluation today. 06/12/2019 on evaluation today patient actually appears to be doing significantly better compared to last time I saw her. Following when I last saw her she was actually in the hospital from that Monday until the following Sunday almost 1 full week. She actually was placed on Keflex in the hospital following the time for her to be discharged and Dr. Steva Ready has recommended 2 times a day dosing of the Keflex for the next year in order to help with more prophylactic/preventative measures with regard to her developing cellulitis. Overall I think this sounds like an excellent plan. The patient unfortunately is good to have trouble being treated at lymphedema clinic due to the fact that she really cannot get up on the bed that they have there. They also state that they cannot manage her as long as she has anything draining at this point. Obviously that is somewhat unfortunate as she does need help with edema control but nonetheless we will have to do what we can for her outside of it sounds like the lymphedema clinic scenario at this point. 06/19/2019 on evaluation today patient appears to be doing fairly well with regard to her bilateral lower extremities. She is not nearly as swollen and shows no signs of infection at this point. There is no evidence of cellulitis whatsoever. She also has no open wounds or draining at this point which is also good news. No fever chills noted. She seems to be in very good spirits and in fact appears to be doing quite well. READMISSION 11/27/2019 This is a 66 year old woman that we have had in this clinic several times before including 2015, 16 and 19 and then most recently from  03/20/2019 through 06/19/2019 with bilateral lower extremity lymphedema. She has had previous arterial and reflux studies done years ago which were not all that remarkable. 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 She does have a positive family history and she had large legs starting may be in her 52s. She was recently in hospital from 10/20/2019 through 10/28/2019 with right leg cellulitis. She was given Ancef and clindamycin and then Zosyn when a culture showed Pseudomonas. At that time there was purulent drainage. She was followed by infectious disease Dr Steva Ready. The patient is now back at home. She has noted increased swelling in the right and no drainage in her right leg mostly on the posterior medial  aspect in the calf area. She has not had pain or fever. She has literally been improved lysing above dressings because her at the area of this is far too large for standard compression. She has been wrapping the areas with sheets to resorptive pads. She is found these helped somewhat. She does have an appointment with the lymphedema clinic in [place] in late June. Past medical history includes bilateral lymphedema, hypertension, obstructive sleep apnea with CPAP. Recent hospitalization with apparently Pseudomonas cellulitis of the right lower leg 12/15/2019 upon evaluation today patient appears to be doing a little bit worse in regard to her right lower extremity. Unfortunately she is having more weeping [name], [name] ([id number]) [phone].pdf Page 10 of 14 down in the lower portion of her leg. Fortunately there is no signs of active infection at this time. No fever chills noted. The patient states she is not having increased pain except for when she attempted to use the lymphedema pumps unfortunately she states that she did have pain when she did this. Otherwise we been using absorptive dressings of one type or  another she is using diapers at home and then subsequently Ace wraps. In regard to the barrier cream we have discussed the possibility of derma cloud which she would like to try I do not have a problem with that. 12/22/2019 upon evaluation today patient actually appears to be doing better in regard to her leg ulcers at this point. Fortunately there does not appear to be any signs of active infection which is great news and I am extremely pleased with where things are progressing at this time. There is no sign of active infection currently. The patient is very pleased to see things doing so well. 12/29/2019 upon evaluation today patient appears to be doing a little bit better in regard to her weeping in general over her lower extremities. She does have some signs of mild erythema little bit more than what I noted last week or rather last visit. Nonetheless I think that my threshold for switching her antibiotics from Keflex to something else is very low at this point considering that she has had such severe infections in the past that seem to come almost out of nowhere. There is a little erythema and warmth noted of the lower portion of her leg compared to the upper which also makes me want to go ahead and address things more rapidly at this point. Likely I would switch out the Keflex for something like Levaquin ideally. 7/16; patient with severe bilateral lymphedema. She has superficial wounds albeit almost circumferential now on the left lateral lower leg. This may be new from last time. Small area on the right anterior lower leg and then another area on the right medial lower leg and of pannus fold. She has been using various absorptive garments. She states she is using her compression pumps once a day occasionally twice. Culture from her last visit here was negative 01/29/2020 on evaluation today patient appears to be doing excellent at this point in regard to her legs with regard to infection I see no  signs of active infection at this point. She still does have unfortunately areas of weeping this is minimal on the right now her left is actually significantly worse although I do not think it is as bad as last week with Dr. Dellia Nims saw her. She has been trying to pump and elevate her legs is much as possible. She has previously been on the Keflex and in  the past for prevention that seems to do fairly well and likely can extend that today. 02/04/2020 on evaluation today patient appears to be doing better in regard to her legs bilaterally. Fortunately there is no signs of active infection at this time which is great news and overall she has less weeping on the left compared to the right and there is several spots where she is pretty much sealed up with no draining regions. Overall very pleased in this regard. 02/19/2020 on evaluation today patient appears to be doing very well in regard to her wounds currently. Fortunately there is no evidence of active infection overall very pleased with where things stand. She is significantly improved in regard to her edema I am extremely pleased in this regard she tells me that the popping no longer hurts and in fact she actually looks forward to it. 03/04/2020 on evaluation today patient appears to be doing excellent in regard to her lower extremities. Fortunately there is no signs of active infection at this time. No fevers, chills, nausea, vomiting, or diarrhea. 03/25/2020 on evaluation today patient appears to be doing a little bit more poorly in regard to her legs at this point. She tells me that she is still continue to have issues with drainage and this has been a little bit worse she was getting ready to start taking the Keflex again but wanted to see me first. Fortunately there is no signs of active infection at this time. No fevers, chills, nausea, vomiting, or diarrhea. 04/15/2020 upon evaluation today patient appears to be doing somewhat poorly in regard to  her right leg. She tells me she has been having more pain she has been taking the Keflex that was previously prescribed unfortunately that just does not seem to help with this. She was hoping that the pain on her right was actually coming from the fact that she was having issues with her wrap having gotten caught in her recliner. With that being said she tells me that she knew something was not right. Currently her right leg is warm to touch along with being erythematous all the way up to around at least mid thigh as far as I can see. The left leg does not appear to be doing that badly though there is increased weeping around the ankle region. 05/06/2020 on evaluation today patient appears to be doing much better than last time I saw her. She did go to the hospital where she was admitted for 2 days and treated with antibiotic therapy. She was discharged with antibiotics as well and has done extremely well. I am extremely pleased with where things stand today. There is no signs of active infection at this time which is great news. 05/27/2020 upon evaluation today patient appears to be doing well with regard to her lower extremities bilaterally. She has just a very tiny area on the right leg which is opening on the left leg she is significantly improved though she still has several areas that do appear to be open this is minimal compared to what is been in the past. In general I am extremely pleased with where things stand today. The patient does tell me she is not been using her pumps quite as much as she should be. I do believe that is 1 area she can definitely work on. She has had a lot going on including a Covid exposure and apparently also a outbreak of likely shingles. 06/24/2020 upon evaluation today patient appears to be doing well  with regard to her legs in general although the left leg unfortunately is showing some signs of erythema she does have a little bit of increased weeping and to be  honest I am concerned about infection here. I discussed that with her today and I think that we may need to address this sooner rather than later she has been taking Keflex she is not really certain that is been making a big improvement however. No fevers, chills, nausea, vomiting, or diarrhea. 06/30/2020 upon evaluation today patient's legs actually seem to be doing better in my opinion as compared to where they were last week. Fortunately there does not appear to be any signs of active infection. Her culture showed multiple organisms nothing predominate. With that being said the Levaquin seems to have done well I think she has improved since I last saw her as well. 07/14/2020 upon evaluation today patient appears to be doing actually better in regard to her lower extremities in my opinion. She has been tolerating the dressing changes without complication. Fortunately there is no signs of active infection at this time. 08/04/2020 upon evaluation today patient appears to be doing excellent in regard to her leg ulcers. Fortunately she has very little that is open at this point. This is great news. In fact I think that the [employer] medicated powder has been excellent for her. It seems to have done the trick where we had tried several other things without as much success. Fortunately there is no evidence of active infection at this time. No fevers, chills, nausea, vomiting, or diarrhea. 09/01/2020 upon evaluation today patient appears to be doing a little bit more poorly than the last time I saw her. She tells me right now that she has been having a lot of drainage compared to where things were previous which has unfortunately led to more irritation as well. She is concerned that this is leading to infection. Again based on what I am seeing today as well I am also concerned of the same to be honest. 09/15/2020 upon evaluation today patient appears to be doing well with regard to her legs compared to what I saw  previous. Fortunately there does not appear to be any evidence of active infection at this time which is great news. At least not as badly as what it was previous. With that being said I do believe that the patient does need to have an extension of the antibiotics. This I believe will actually help her more in the way of making sure this stays under control and does not worsen. That was the Bactrim DS. Readmission: 08/01/2021 upon evaluation today patient appears to be doing actually pretty well all things considered. Is actually been almost a year since have seen her last March. Fortunately I do not see any evidence of active infection locally nor systemically at this point. She does have a couple open areas on the left leg the right leg appears to be doing quite well. In general she has been in the hospital 3 times since I saw her a year ago all 4 cellulitis type issues except for the last 1 which was actually more related to congestive heart failure for that reason she is not using her lymphedema pumps at this point and that is probably the best idea. 08/08/2021 upon evaluation today patient appears to be doing well with regard to her legs all things considered her left leg may be a little bit more swollen based on what I see today. Fortunately  I do not see any signs of active infection locally nor systemically at this time which is great news. No fevers, chills, nausea, vomiting, or diarrhea. 08/15/2021 upon evaluation today patient actually appears to be doing excellent in regard to her wounds. She has been tolerating the dressing changes without [name], CORRIHER ([id number]) [phone].pdf Page 11 of 14 complication. Fortunately I see no evidence of infection currently which is great news. No fevers, chills, nausea, vomiting, or diarrhea. 08/29/2021 upon evaluation patient appears to be doing excellent she in fact is almost completely healed. I am actually very pleased with  where we stand today and I think that she is making wonderful progress she just has a small area of weeping on the right lateral leg everything else is pretty much completely healed which is also. 09/05/2021 upon evaluation today patient appears to be doing well with regard to her legs. She does have a small area of weeping on the right leg and there is a small area on the left leg as well. It is potentially something that may need to be addressed in the future more specifically but right now I think that there may have just been a little drainage here on the left. With all that being said I think that this still does not appear to be infected and overall I feel like she is doing quite well. That something we always have to keep a very close eye on as she can change very rapidly from okay to not okay. 09/12/2021 upon evaluation today patient appears to be doing a little bit worse in regard to her leg and actually somewhat concerned about the possibility of infection here. I discussed that with the patient. For that reason I am going to go ahead and see about getting her set up for a repeat prescription for the Bactrim. She is in agreement with that plan. 3/14; patient presents for follow-up. She has been using silver alginate with dressing changes. She is still taking Bactrim prescribed at last clinic visit. She has no issues or complaints today. She has lymphedema pumps however does not use these. 09/26/2020 upon evaluation today patient appears to actually be doing well in regard to her right leg I am pleased in that regard. Unfortunately she has new areas on her left leg that are open at this point that I do need to be addressed. I am also concerned about the warmth around one of the wounds on the more anterior side. I think this is something that we may need to culture today potentially requiring antibiotics going forward. 10/03/2021 upon evaluation today patient unfortunately is not doing nearly as  well as she was even last week with her wounds. She is having some issues here with increased cellulitis of the left lower extremity unfortunately. I did prescribe Augmentin for her eczema prescribed last week unfortunately she never actually received this prescription. The pharmacy stated they never got it. We double checked with them today they still did not receive it. I am not sure what happened in that regard. With that being said the patient is in good spirits today she does not appear to be as sick as where she normally is when the cellulitis gets significantly worse as it has in the past. Nonetheless the leg is much more red not just around the ankle where I was seeing at last week on Tuesday but rather now this is going all the way up to almost her knee and is definitely hot to  touch compared to the right leg. Nonetheless I feel like she does need to go to the ER for likely IV antibiotics. 10-10-2021 upon evaluation today patient appears to be doing well with regard to her wound. She has been tolerating the dressing changes that appears to be doing much better. After I saw her last week she did go to the ER they admitted her to the hospital and gave her IV antibiotics she fortunately is doing significantly better. Overall I am extremely pleased with where things stand today. 10-17-2021 upon evaluation today patient appears to be doing well with regard to her wound. In fact this is looking better and her leg is looking better but she still does have some areas here of erythema. We will continue to address this as soon as possible in my opinion. I do believe she may require some debridement and what appears to be a more defined wound on her left leg at this point. 10-24-2021 upon evaluation patient is definitely showing signs of improvement which is great news. I do not see any evidence of active infection locally or systemically which is great news as well. No fevers, chills, nausea, vomiting, or  diarrhea. 10-31-2021. Upon evaluation today patient's leg actually feels better from an infection and wound care perspective. I am actually very pleased in this regard. Unfortunately the biggest issue that I see at this time is that the patient is having a significant issue with her breathing. I did put her on the pulse ox machine to check her oxygen saturation and it was pretty much at 100% which is good but she tells me that when she is walking this is much more difficult for her. She also tells me that when she is trying to bend over to perform her dressing changes she is so short of breath due to the swelling and fluid in her abdominal area that she is just not been able to do this. She is tearful and very concerned today to be honest. I am truly sorry to see her this way I know she is really having a hard time at the moment. 11-14-2021 upon evaluation patient actually appears to be doing significantly better compared to when I last saw her. She was actually admitted to the hospital I believe this for 6 days total after I sent her on 10-31-2021. Subsequently they actually pulled off greater than 50 pounds of fluid she tells me she feels significantly better her legs are not as tight she actually has some play in the tissue and it is obvious that she is doing much better just from a mobility standpoint as well as a breathing standpoint. Overall I am extremely happy for her and how she is doing the leg also looks much better on the left. 11-21-2021 upon evaluation today patient appears to be doing well although it does appear that she is going require some sharp debridement the wound is very dry this is the opposite of what its been she was having so much weeping that it was staying extremely wet. Nonetheless we do need to see about going ahead and debriding the wound today. 11-28-2021 upon evaluation today patient appears to be doing well with regard to her wound I definitely see signs of things  continuing to improve which is great news. I do not see any evidence of active infection locally or systemically also great news. 12-05-2021 upon evaluation today patient appears to be doing well with regard to her wound. She has been tolerating  the dressing changes. Fortunately there does not appear to be any signs of active infection locally or systemically at this time. No fevers, chills, nausea, vomiting, or diarrhea. 12-12-2021 upon evaluation patient appears to be doing well with regard to her wound this is actually measuring smaller and looking much better. Fortunately I do not see any signs of active infection locally or systemically at this time which is excellent news. 6/13; small wound area in the folds of her lymphedema just above the left ankle. Fortunately the area looks quite good. She has been using silver alginate ABDs and an Ace wrap which she is able to change her self. She is not using her compression pumps out of fear that this could contribute to heart failure. 12-26-2021 upon evaluation patient's wounds are actually showing signs of excellent improvement. I am very pleased with where things stand. Overall I think that she has been making excellent progress and I think we are very close to complete resolution which is great news. 01-11-2022 upon evaluation today patient appears to be doing well currently in regard to her legs in general although on the left leg she does have 1 area that still irritated and inflamed on the anterior portion of her leg. Fortunately I do not see any signs of systemic infection locally there might be some infection going on here however which is my biggest concern. Fortunately I do not see any evidence though of this spreading to any other location. 01-16-2022 upon evaluation today patient has actually showing signs of excellent improvement. Fortunately I do not see any evidence of infection locally or systemically which is great news and overall I am very  pleased with where we stand at this point. 01-25-2022 upon evaluation today patient actually appears to be doing decently well in regard to her legs. She has been tolerating the dressing changes without complication. Fortunately there does not appear to be any evidence of active infection locally or systemically which is great news and overall I am extremely pleased with where we stand at this point. 02-13-2022 upon evaluation today patient appears to be doing well currently in regard to her wound which is actually showing signs of significant improvement. Fortunately I do not see any evidence of active infection locally or systemically at this time. No fevers, chills, nausea, vomiting, or diarrhea. 02-20-2022 upon evaluation today patient appears to be doing well currently in regard to her wound. She has been tolerating the dressing changes without complication. Fortunately there does not appear to be any signs of active infection locally or systemically at this time. No fevers, chills, nausea, vomiting, or diarrhea. ELVI, LEVENTHAL ([id number]) [id number] Page 12 of 14 02-27-2022 upon evaluation today patient appears to be doing excellent in regard to her wounds on the leg. She has been tolerating the dressing changes without complication this is very close to complete resolution. Fortunately I do not see any signs of infection locally or systemically at this time which is great news. No fevers, chills, nausea, vomiting, or diarrhea. 03-06-2022 upon evaluation today patient appears to be doing well currently in regard to her wound in general. She has been tolerating the dressing changes without complication. Fortunately I do not see any evidence of active infection locally or systemically at this time which is great news. I am concerned a little bit however about not really her wounds but the second toe right foot where there appears to be some cellulitis after she dropped a  frying pan on this about  2 weeks ago. She tells me its been itching and burning and I do think that it is possible she may have an infection based on what we are seeing currently. 03-20-2022 upon evaluation today patient actually appears to be doing much better at this point. Fortunately there does not appear to be any signs of active infection locally or systemically at this time which is great news. No fevers, chills, nausea, vomiting, or diarrhea. 03-27-2022 upon evaluation today patient appears to be doing somewhat poorly in regard to her legs compared to previous. Her right leg has a couple areas that are open and she is very warm and painful to touch around the lateral portion of the ankle area. Left leg is showing signs of little bit more drainage and weeping as well I think that this is probably due to the fact that she is swelling a lot more that she has been in the past. Fortunately I do not see any signs of active infection systemically though locally there is definitely some issues here. 04-03-2022 upon evaluation today patient appears to be doing poorly in regard to her wounds on the legs unfortunately. She also seems to have more significant cellulitis that has ensued. Unfortunately she has been on doxycycline initially it seemed to be helping but as of this week that is no longer the case. It started getting worse last week and with the reinitiation of the doxycycline nothing has improved. Often when she gets like this IV antibiotics are necessary. 04-19-2022 upon evaluation today patient appears to be doing well currently in regard to her legs from an infection standpoint. She did get IV vancomycin in the hospital and states that her legs did get better when she left she was no longer weeping but she had been in the bed for 4 days straight. With that being said since coming home her legs have begun to weep again the original wound that we were dealing with has healed she does have some  weeping over both lower extremities however which is still of concern. 05-03-2022 upon evaluation today patient appears to be doing well currently in regard to her left leg which I feel like is a little bit better unfortunately the right leg is still draining significantly. Fortunately I do not see any signs of infection locally or systemically at this time. No fevers, chills, nausea, vomiting, or diarrhea. 05-10-2022 unfortunately patient actually showed signs of increasing erythema today. I believe that the cellulitis never completely resolved from when she was in the hospital last. Again she has been on doxycycline previously by myself I gave her Keflex more recently as an ongoing prescription to try to prevent infection but again I do not think this is strong enough to be able to get this under control. I really feel like that she is probably can need IV antibiotics and a good period of time in the hospital to be able to keep her legs elevated and not having to get around to do a whole lot in order to allow this to resolve. The patient voiced understanding. 05-24-2022 upon evaluation today patient appears to be doing excellent in regard to her legs. She has been tolerating the dressing changes without complication. Fortunately there does not appear to be any signs of infection at this time which is great news she has been on cefadroxil since she was discharged from the hospital. The day that we sent her she actually tells me that she ended up in septic shock that day  and coded. When she first showed up they did not notice it on the lab work but said by the end of the day she had already undergone this and they had had to address the issue obviously when she coded. She tells me that likely she would not be here today if she had not gone to the hospital that day. I explained to her that I do believe that was a God thing and it was just the perfect timing that he had for her at that time. 06-14-2022  upon evaluation today patient appears to be doing well currently in regard to her legs. In general I think she is making good progress and I do not see any signs of infection at this time which is great news. No fevers, chills, nausea, vomiting, or diarrhea. 06-28-2022 upon evaluation today patient appears to be doing excellent in regard to her legs. She is actually showing signs of excellent improvement I do not see any evidence of infection locally nor systemically at this time. No fevers, chills, nausea, vomiting, or diarrhea. Objective Constitutional Well-nourished and well-hydrated in no acute distress. Vitals Time Taken: 12:59 PM, Height: 63 in, Weight: 372 lbs, BMI: 65.9, Temperature: 98.2 F, Pulse: 93 bpm, Respiratory Rate: 18 breaths/min, Blood Pressure: 125/70 mmHg. Respiratory normal breathing without difficulty. Psychiatric this patient is able to make decisions and demonstrates good insight into disease process. Alert and Oriented x 3. pleasant and cooperative. General Notes: Upon inspection patient's wound bed actually showed signs of good granulation epithelization at this point. Fortunately I see no evidence of active infection at this point which is great news and overall I do believe that we are headed in the right direction here. Other Condition(s) Patient presents with Lymphedema located on the Right Leg. General Notes: swelling down in right leg, no drainage or redness noted Patient presents with Lymphedema located on the Left Leg. 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 Nurse Visit as needed Bathing/ Shower/ Hygiene: Clean wound with Normal Saline or wound cleanser. Wash wounds with antibacterial soap and water. May shower; gently cleanse wound with antibacterial soap, rinse and pat dry prior to dressing wounds No tub bath. Edema Control - Lymphedema / Segmental Compressive Device / Other: Ace wraps Elevate, Exercise Daily and Avoid Standing for Long Periods of Time. Elevate leg(s) parallel to the floor when sitting. DO YOUR BEST to sleep in the bed at night. DO NOT sleep in your recliner. Long hours of sitting in a recliner leads to swelling of the legs and/or potential wounds on your backside. Other: - use lymphedema pumps Non-Wound Condition: Cleanse affected area with antibacterial soap and water, Apply appropriate compression. - use abdominal pads them ace wrap to secure Additional non-wound orders/instructions: - silver cell to open weeping areas 1. I do think that the patient is doing quite well and I am extremely pleased with where we stand. With that being said I think that keeping an eye on her may be a good idea. Organ to probably do just 1 month follow-up visits at this point just to keep very close eye on things and make sure nothing worsens. She is in  agreement with that plan. Obviously if anything changes or worsens in the meantime she will contact the office let me know. 2. In the meantime she is going to continue with her compression with the Ace wrap she also should continue with lymphedema pumps as well as elevate her legs as much as she can. We will see patient back for reevaluation in 1 week here in the clinic. If anything worsens or changes patient will contact our office for additional recommendations. Electronic Signature(s) Signed: 06/28/2022 1:38:54 PM By: Worthy Keeler PA-C Entered By: Worthy Keeler on 06/28/2022 13:38:53 -------------------------------------------------------------------------------- SuperBill  Details Patient Name: Date of Service: AUBREIGH, FUERTE 06/28/2022 Medical Record Number: [id number] Patient Account Number: [id number] Date of Birth/Sex: Treating RN: 05-Dec-1955 (66 y.o. [place] Primary Care Provider: Ranae Plumber Other Clinician: Massie Kluver Referring Provider: Treating Provider/Extender: Skeet Simmer in Treatment: 47 Diagnosis Coding ICD-10 Codes Code Description Q82.0 Hereditary lymphedema L97.811 Non-pressure chronic ulcer of other part of right lower leg limited to breakdown of skin L97.821 Non-pressure chronic ulcer of other part of left lower leg limited to breakdown of skin AUDI, CONOVER ([id number]) [phone].pdf Page 14 of [place] Procedures : CPT4 Code: [id number] Description: [phone] - WOUND CARE VISIT-LEV 2 EST PT Modifier: Quantity: 1 Physician Procedures : CPT4 Code Description Modifier [id number] - WC PHYS LEVEL 3 - EST PT ICD-10 Diagnosis Description Q82.0 Hereditary lymphedema L97.811 Non-pressure chronic ulcer of other part of right lower leg limited to breakdown of skin L97.821 Non-pressure  chronic ulcer of other part of left lower leg limited to breakdown of skin Quantity: 1 Electronic Signature(s) Signed: 06/28/2022 1:46:49 PM By: Worthy Keeler PA-C Entered By: Worthy Keeler on 06/28/2022 13:46:49

## 2022-06-29 NOTE — Progress Notes (Signed)
JOLAN, MEALOR ([id number]) [id number] Page 1 of 8 Visit Report for 06/28/2022 Arrival Information Details Patient Name: Date of Service: [name], [name] 06/28/2022 12:45 PM Medical Record Number: [id number] Patient Account Number: [id number] Date of Birth/Sex: Treating RN: [birth year] (66 y.o. Charolette Forward, Kim Primary Care Genola Yuille: Ranae Plumber Other Clinician: Massie Kluver Referring Francia Verry: Treating Elie Gragert/Extender: Skeet Simmer in Treatment: 33 Visit Information History Since Last Visit All ordered tests and consults were completed: No Patient Arrived: Gilford Rile Added or deleted any medications: Yes Arrival Time: 12:58 Any new allergies or adverse reactions: No Transfer Assistance: None Had a fall or experienced change in No Patient Identification Verified: Yes activities of daily living that may affect Secondary Verification Process Completed: Yes risk of falls: Patient Requires Transmission-Based Precautions: No Signs or symptoms of abuse/neglect since last visito No Patient Has Alerts: Yes Hospitalized since last visit: No Patient Alerts: Borderline Diabetic Implantable device outside of the clinic excluding No cellular tissue based products placed in the center since last visit: Has Dressing in Place as Prescribed: Yes Pain Present Now: Yes Electronic Signature(s) Signed: 06/29/2022 9:48:27 AM By: Massie Kluver Entered By: Massie Kluver on 06/28/2022 12:58:46 -------------------------------------------------------------------------------- Clinic Level of Care Assessment Details Patient Name: Date of Service: [name], [name] 06/28/2022 12:45 PM Medical Record Number: [id number] Patient Account Number: [id number] Date of Birth/Sex: Treating RN: 1956/02/27 (66 y.o. Marlowe Shores Primary Care Amay Mijangos: Ranae Plumber Other Clinician: Massie Kluver Referring Myleigh Amara: Treating Sulma Ruffino/Extender: Skeet Simmer in Treatment: 47 Clinic Level of Care Assessment Items TOOL 4 Quantity Score '[]'$  - 0 Use when only an EandM is performed on FOLLOW-UP visit ASSESSMENTS - Nursing Assessment / Reassessment X- 1 10 Reassessment of Co-morbidities (includes updates in patient status) X- 1 5 Reassessment of Adherence to Treatment Plan [name], [name] ([id number]) [id number] Page 2 of 8 ASSESSMENTS - Wound and Skin A ssessment / Reassessment '[]'$  - 0 Simple Wound Assessment / Reassessment - one wound '[]'$  - 0 Complex Wound Assessment / Reassessment - multiple wounds '[]'$  - 0 Dermatologic / Skin Assessment (not related to wound area) ASSESSMENTS - Focused Assessment '[]'$  - 0 Circumferential Edema Measurements - multi extremities '[]'$  - 0 Nutritional Assessment / Counseling / Intervention '[]'$  - 0 Lower Extremity Assessment (monofilament, tuning fork, pulses) '[]'$  - 0 Peripheral Arterial Disease Assessment (using hand held doppler) ASSESSMENTS - Ostomy and/or Continence Assessment and Care '[]'$  - 0 Incontinence Assessment and Management '[]'$  - 0 Ostomy Care Assessment and Management (repouching, etc.) PROCESS - Coordination of Care X - Simple Patient / Family Education for ongoing care 1 15 '[]'$  - 0 Complex (extensive) Patient / Family Education for ongoing care '[]'$  - 0 Staff obtains Programmer, systems, Records, T Results / Process Orders est '[]'$  - 0 Staff telephones HHA, Nursing Homes / Clarify orders / etc '[]'$  - 0 Routine Transfer to another Facility (non-emergent condition) '[]'$  - 0 Routine Hospital Admission (non-emergent condition) '[]'$  - 0 New Admissions / Biomedical engineer / Ordering NPWT Apligraf, etc. , '[]'$  - 0 Emergency Hospital Admission (emergent condition) X- 1 10 Simple Discharge Coordination '[]'$  - 0 Complex (extensive) Discharge Coordination PROCESS - Special Needs '[]'$  - 0 Pediatric / Minor Patient Management '[]'$  - 0 Isolation Patient Management '[]'$   - 0 Hearing / Language / Visual special needs '[]'$  - 0 Assessment of Community assistance (transportation, D/C planning, etc.) '[]'$  - 0 Additional assistance / Altered mentation '[]'$  - 0 Support Surface(s) Assessment (bed, cushion, seat, etc.) INTERVENTIONS -  Wound Cleansing / Measurement '[]'$  - 0 Simple Wound Cleansing - one wound '[]'$  - 0 Complex Wound Cleansing - multiple wounds '[]'$  - 0 Wound Imaging (photographs - any number of wounds) '[]'$  - 0 Wound Tracing (instead of photographs) '[]'$  - 0 Simple Wound Measurement - one wound '[]'$  - 0 Complex Wound Measurement - multiple wounds INTERVENTIONS - Wound Dressings '[]'$  - 0 Small Wound Dressing one or multiple wounds '[]'$  - 0 Medium Wound Dressing one or multiple wounds '[]'$  - 0 Large Wound Dressing one or multiple wounds '[]'$  - 0 Application of Medications - topical '[]'$  - 0 Application of Medications - injection INTERVENTIONS - Miscellaneous '[]'$  - 0 External ear exam [name], [name] ([id number]) [id number] Page 3 of 8 '[]'$  - 0 Specimen Collection (cultures, biopsies, blood, body fluids, etc.) '[]'$  - 0 Specimen(s) / Culture(s) sent or taken to Lab for analysis '[]'$  - 0 Patient Transfer (multiple staff / Harrel Lemon Lift / Similar devices) '[]'$  - 0 Simple Staple / Suture removal (25 or less) '[]'$  - 0 Complex Staple / Suture removal (26 or more) '[]'$  - 0 Hypo / Hyperglycemic Management (close monitor of Blood Glucose) '[]'$  - 0 Ankle / Brachial Index (ABI) - do not check if billed separately X- 1 5 Vital Signs Has the patient been seen at the hospital within the last three years: Yes Total Score: 45 Level Of Care: New/Established - Level 2 Electronic Signature(s) Signed: 06/29/2022 9:48:27 AM By: Massie Kluver Entered By: Massie Kluver on 06/28/2022 13:20:24 -------------------------------------------------------------------------------- Encounter Discharge Information Details Patient Name: Date of Service: [name]  06/28/2022 12:45 PM Medical Record Number: [id number] Patient Account Number: [id number] Date of Birth/Sex: Treating RN: [birth year] (66 y.o. Charolette Forward, Kim Primary Care Eve Rey: Ranae Plumber Other Clinician: Massie Kluver Referring Dinia Joynt: Treating Olanda Boughner/Extender: Skeet Simmer in Treatment: 60 Encounter Discharge Information Items Discharge Condition: Stable Ambulatory Status: Walker Discharge Destination: Home Transportation: Other Accompanied By: self Schedule Follow-up Appointment: Yes Clinical Summary of Care: Electronic Signature(s) Signed: 06/29/2022 9:48:27 AM By: Massie Kluver Entered By: Massie Kluver on 06/28/2022 13:37:21 Lower Extremity Assessment Details -------------------------------------------------------------------------------- [name] ([id number]) [id number] Page 4 of 8 Patient Name: Date of Service: [name], [name] 06/28/2022 12:45 PM Medical Record Number: [id number] Patient Account Number: [id number] Date of Birth/Sex: Treating RN: January 08, 1956 (66 y.o. Marlowe Shores Primary Care Ulice Follett: Ranae Plumber Other Clinician: Massie Kluver Referring Carrick Rijos: Treating Willem Klingensmith/Extender: Skeet Simmer in Treatment: 40 Electronic Signature(s) Signed: 06/28/2022 3:46:10 PM By: Gretta Cool BSN, RN, CWS, Kim RN, BSN Signed: 06/29/2022 9:48:27 AM By: Massie Kluver Entered By: Massie Kluver on 06/28/2022 13:12:53 -------------------------------------------------------------------------------- Multi Wound Chart Details Patient Name: Date of Service: [name], [name] 06/28/2022 12:45 PM Medical Record Number: [id number] Patient Account Number: [id number] Date of Birth/Sex: Treating RN: 04-07-1956 (66 y.o. Marlowe Shores Primary Care Elania Crowl: Ranae Plumber Other Clinician: Massie Kluver Referring Athalee Esterline: Treating Graves Nipp/Extender: Skeet Simmer in  Treatment: 47 Vital Signs Height(in): 63 Pulse(bpm): 93 Weight(lbs): 372 Blood Pressure(mmHg): 125/70 Body Mass Index(BMI): 65.9 Temperature(F): 98.2 Respiratory Rate(breaths/min): 18 [Treatment Notes:Wound Assessments Treatment Notes] Electronic Signature(s) Signed: 06/29/2022 9:48:27 AM By: Massie Kluver Entered By: Massie Kluver on 06/28/2022 13:13:01 -------------------------------------------------------------------------------- Multi-Disciplinary Care Plan Details Patient Name: Date of Service: [name], [name] 06/28/2022 12:45 PM Medical Record Number: [id number] Patient Account Number: [id number] Date of Birth/Sex: Treating RN: 12-May-1956 (66 y.o. Marlowe Shores Primary Care Tauna Macfarlane: Ranae Plumber Other Clinician: Massie Kluver Referring Ashyla Luth: Treating Graesyn Schreifels/Extender:  Stone, Doree Barthel, Vaughan Basta Weeks in Treatment: [address] [place] ([id number]) [id number] Page 5 of 8 Active Inactive Wound/Skin Impairment Nursing Diagnoses: Impaired tissue integrity Knowledge deficit related to ulceration/compromised skin integrity Goals: Ulcer/skin breakdown will have a volume reduction of 30% by week 4 Date Initiated: 08/01/2021 Target Resolution Date: 08/29/2021 Goal Status: Active Ulcer/skin breakdown will have a volume reduction of 50% by week 8 Date Initiated: 08/01/2021 Target Resolution Date: 09/26/2021 Goal Status: Active Ulcer/skin breakdown will have a volume reduction of 80% by week 12 Date Initiated: 08/01/2021 Target Resolution Date: 10/24/2021 Goal Status: Active Ulcer/skin breakdown will heal within 14 weeks Date Initiated: 08/01/2021 Target Resolution Date: 11/07/2021 Goal Status: Active Interventions: Assess patient/caregiver ability to obtain necessary supplies Assess patient/caregiver ability to perform ulcer/skin care regimen upon admission and as needed Assess ulceration(s) every visit Provide education on ulcer and skin  care Notes: Electronic Signature(s) Signed: 06/28/2022 3:46:10 PM By: Gretta Cool, BSN, RN, CWS, Kim RN, BSN Signed: 06/29/2022 9:48:27 AM By: Massie Kluver Entered By: Massie Kluver on 06/28/2022 13:21:38 -------------------------------------------------------------------------------- Non-Wound Condition Assessment Details Patient Name: Date of Service: [name], [name] 06/28/2022 12:45 PM Medical Record Number: [id number] Patient Account Number: [id number] Date of Birth/Sex: Treating RN: 07/20/1955 (66 y.o. Marlowe Shores Primary Care Marijayne Rauth: Ranae Plumber Other Clinician: Massie Kluver Referring Harrie Cazarez: Treating Eiman Maret/Extender: Skeet Simmer in Treatment: 46 Non-Wound Condition: Condition: Lymphedema Location: Leg Side: Right Notes: Right leg with decreased drainage this visit. Notes swelling down in right leg, no drainage or redness noted Electronic Signature(s) Signed: 06/28/2022 3:46:10 PM By: Gretta Cool, BSN, RN, CWS, Kim RN, BSN Signed: 06/29/2022 9:48:27 AM By: Massie Kluver Entered By: Massie Kluver on 06/28/2022 13:12:41 [name] ([id number]) [id number] Page 6 of 8 -------------------------------------------------------------------------------- Non-Wound Condition Assessment Details Patient Name: Date of Service: [name], [name] 06/28/2022 12:45 PM Medical Record Number: [id number] Patient Account Number: [id number] Date of Birth/Sex: Treating RN: [birth year] (66 y.o. Marlowe Shores Primary Care Jaser Fullen: Ranae Plumber Other Clinician: Massie Kluver Referring Tayra Dawe: Treating Maleiah Dula/Extender: Skeet Simmer in Treatment: 28 Non-Wound Condition: Condition: Lymphedema Location: Leg Side: Left Notes: decreased drainage this visit. Notes Left leg swelling down, no sign of redness or drainage Electronic Signature(s) Signed: 06/28/2022 3:46:10 PM By: Gretta Cool, BSN, RN, CWS, Kim RN,  BSN Signed: 06/29/2022 9:48:27 AM By: Massie Kluver Entered By: Massie Kluver on 06/28/2022 13:12:44 -------------------------------------------------------------------------------- Pain Assessment Details Patient Name: Date of Service: [name], [name] 06/28/2022 12:45 PM Medical Record Number: [id number] Patient Account Number: [id number] Date of Birth/Sex: Treating RN: May 07, 1956 (66 y.o. Charolette Forward, Kim Primary Care Moraima Burd: Ranae Plumber Other Clinician: Massie Kluver Referring Rehman Levinson: Treating Chantry Headen/Extender: Skeet Simmer in Treatment: 47 Active Problems Location of Pain Severity and Description of Pain Patient Has Paino Yes Site Locations Pain Location: [facility], MUSIC ([id number]) [phone].pdf Page 7 of 8 Pain Location: Generalized Pain, Pain in Ulcers Duration of the Pain. Constant / Intermittento Constant Rate the pain. Current Pain Level: 2 Character of Pain Describe the Pain: Burning Pain Management and Medication Current Pain Management: Medication: Yes Cold Application: No Rest: No Massage: No Activity: No T.E.N.S.: No Heat Application: No Leg drop or elevation: No Is the Current Pain Management Adequate: Inadequate How does your wound impact your activities of daily livingo Sleep: No Bathing: No Appetite: No Relationship With Others: No Bladder Continence: No Emotions: No Bowel Continence: No Work: No Toileting: No Drive: No Dressing: No Hobbies: No Electronic Signature(s) Signed: 06/28/2022 3:46:10  PM By: Gretta Cool, BSN, RN, CWS, Kim RN, BSN Signed: 06/29/2022 9:48:27 AM By: Massie Kluver Entered By: Massie Kluver on 06/28/2022 13:03:06 -------------------------------------------------------------------------------- Patient/Caregiver Education Details Patient Name: Date of Service: [name], [name] 12/21/2023andnbsp12:45 PM Medical Record Number: [id number] Patient Account Number:  [id number] Date of Birth/Gender: Treating RN: 1955-10-04 (66 y.o. Marlowe Shores Primary Care Physician: Ranae Plumber Other Clinician: Massie Kluver Referring Physician: Treating Physician/Extender: Skeet Simmer in Treatment: 1 Education Assessment Education Provided To: Patient Education Topics Provided Wound/Skin Impairment: Handouts: Other: follow up once monthly, call if any symptoms arise [name], [name] ([id number]) [phone].pdf Page 8 of 8 Methods: Explain/Verbal Responses: State content correctly Electronic Signature(s) Signed: 06/29/2022 9:48:27 AM By: Massie Kluver Entered By: Massie Kluver on 06/28/2022 13:21:33 -------------------------------------------------------------------------------- Vitals Details Patient Name: Date of Service: [name], BLINDER 06/28/2022 12:45 PM Medical Record Number: [id number] Patient Account Number: [id number] Date of Birth/Sex: Treating RN: 30-Dec-1955 (66 y.o. Charolette Forward, Kim Primary Care Aracelie Addis: Ranae Plumber Other Clinician: Massie Kluver Referring Jaymir Struble: Treating Janmarie Smoot/Extender: Skeet Simmer in Treatment: 47 Vital Signs Time Taken: 12:59 Temperature (F): 98.2 Height (in): 63 Pulse (bpm): 93 Weight (lbs): 372 Respiratory Rate (breaths/min): 18 Body Mass Index (BMI): 65.9 Blood Pressure (mmHg): 125/70 Reference Range: 80 - 120 mg / dl Electronic Signature(s) Signed: 06/29/2022 9:48:27 AM By: Massie Kluver Entered By: Massie Kluver on 06/28/2022 13:02:59

## 2022-07-21 NOTE — Progress Notes (Unsigned)
Patient ID: [name], female    DOB: Jan 02, 1956, 67 y.o.   MRN: [id number]  HPI  Ms Benninger is a 67 y/o female with a history of asthma, HTN, arthritis, lymphedema, sleep apnea, spinal stenosis, obesity, previous tobacco use and chronic heart failure.   Echo report from 11/01/21 reviewed and showed an EF of 60-65%.   Admitted 05/10/22 due to worsening edema and drainage. Patient evaluated and found to have cellulitis of the right lower extremity. Had AKI. Vascular and wound consults obtained. Antibiotics started. Became symptomatic with elevated lactic acid and WBC. IVF given and antibiotic changed. CT scan 11/7 did not show any drainable fluid collection. Discharged after 6 days. Admitted 04/03/22 due to right lower leg cellulitis that had failed out patient therapy. Initially given IV antibiotics and then changed to oral meds. She had hypokalemia and hypomagnesemia that were repleted. Discharged after 4 days.   She presents today for a follow-up visit with a chief complaint of moderate fatigue with little exertion. Describes this as chronic in nature. Has associated cough, lymphedema, palpitations, dizziness and chronic pain along with this. Denies any difficulty sleeping, abdominal distention, chest pain, wheezing or SOB.   She says that since starting farxiga, she feels better. Continues to take metolazone twice weekly.   She is unable to weigh herself at home due to lymphedema.   Past Medical History:  Diagnosis Date   Arthritis    Asthma    CHF (congestive heart failure) ([place])    Enlarged heart    Hypertension    Lymphedema    Sleep apnea    Spinal stenosis    Past Surgical History:  Procedure Laterality Date   CENTRAL LINE INSERTION N/A 05/11/2022   Procedure: CENTRAL LINE INSERTION;  Surgeon: Katha Cabal, MD;  Location: [facility];  Service: Cardiovascular;  Laterality: N/A;   COLONOSCOPY WITH PROPOFOL N/A 03/12/2018   Procedure: COLONOSCOPY WITH  PROPOFOL;  Surgeon: Manya Silvas, MD;  Location: [facility];  Service: Endoscopy;  Laterality: N/A;   NO PAST SURGERIES     Family History  Problem Relation Age of Onset   Breast cancer Maternal Aunt    Thyroid disease Other    Allergies  Allergen Reactions   Vancomycin Other (See Comments)    Red man syndrome   Ace Inhibitors Hives and Swelling   Ibuprofen Hives and Swelling    Review of Systems  Constitutional:  Positive for fatigue. Negative for appetite change.  HENT:  Positive for congestion. Negative for postnasal drip and sore throat.   Eyes: Negative.   Respiratory:  Positive for cough. Negative for shortness of breath and wheezing.   Cardiovascular:  Positive for palpitations and leg swelling. Negative for chest pain.  Gastrointestinal:  Negative for abdominal distention, abdominal pain and constipation.  Endocrine: Negative.   Genitourinary: Negative.   Musculoskeletal:  Positive for arthralgias (right foot) and back pain (when laying in the bed too long).  Skin: Negative.        Severe lymphedema  Allergic/Immunologic: Negative.   Neurological:  Positive for dizziness (at times). Negative for light-headedness.  Hematological:  Negative for adenopathy. Does not bruise/bleed easily.  Psychiatric/Behavioral:  Negative for dysphoric mood and sleep disturbance (sleeping well in new lift chair). The patient is not nervous/anxious.    Vitals:   07/23/22 1106  BP: (!) 132/91  Pulse: 99  Resp: 18  SpO2: 97%  Weight: (!) 389 lb (176.4 kg)   Wt Readings from  Last 3 Encounters:  07/23/22 (!) 389 lb (176.4 kg)  06/20/22 (!) 398 lb (180.5 kg)  05/10/22 (!) 382 lb 8 oz (173.5 kg)   Lab Results  Component Value Date   CREATININE 1.18 (H) 06/20/2022   CREATININE 1.02 (H) 05/16/2022   CREATININE 1.14 (H) 05/15/2022   Physical Exam Vitals and nursing note reviewed.  Constitutional:      Appearance: She is well-developed.  HENT:     Head: Normocephalic and  atraumatic.  Cardiovascular:     Rate and Rhythm: Normal rate and regular rhythm.  Pulmonary:     Effort: Pulmonary effort is normal. No accessory muscle usage.     Breath sounds: No wheezing, rhonchi or rales.  Abdominal:     Palpations: Abdomen is soft.     Tenderness: There is no abdominal tenderness.  Musculoskeletal:     Cervical back: Normal range of motion and neck supple.     Right lower leg: Edema present.     Left lower leg: Edema present.     Comments: Severe lymphedema  Skin:    General: Skin is warm and dry.  Neurological:     General: No focal deficit present.     Mental Status: She is alert and oriented to person, place, and time.  Psychiatric:        Mood and Affect: Mood is not depressed.        Behavior: Behavior normal.   Assessment & Plan:  1: Chronic heart failure with preserved ejection fraction without structural changes- - NYHA class III - euvolemic today - has attempted  to weigh daily with standing on 2 scales but it fluctuates too much - weight down 9 pounds from last visit here 1 month ago - limiting her sodium intake but occasionally will eat saltier foods  - understands to keep daily fluid intake to 60-64 ounces/ day - facial/mouth swelling with ACEi - on farxiga '10mg'$   - BMP today and will call w/ results to discuss adding spironolactone '25mg'$  daily with reduction in metolazone - saw cardiology Margarito Courser) 03/19/22 - BNP 10/31/21 was 123.9 - has received flu vaccine for this season - PharmD reconciled medications with the patient  2: HTN with stage 3 CKD- - BP 132/91 - saw PCP Daryel Gerald) @ [facility]  - saw nephrology Holley Raring) 05/07/22  - continues to take metolazone twice weekly and occasionally doesn't take her bumex just to give her kidneys a break - BMP 06/20/22 reviewed and showed sodium 139, potassium 3.4, creatinine 1.18 and GFR 51  3: Lymphedema- - saw wound center 06/28/22 - both lower legs currently wrapped  4: Sleep apnea- -  wearing CPAP nightly   Medication list reviewed.  Return in 1 month, sooner if needed.

## 2022-07-23 ENCOUNTER — Encounter: Payer: Self-pay | Admitting: Family

## 2022-07-23 ENCOUNTER — Encounter: Payer: Self-pay | Admitting: Pharmacy Technician

## 2022-07-23 ENCOUNTER — Ambulatory Visit (HOSPITAL_BASED_OUTPATIENT_CLINIC_OR_DEPARTMENT_OTHER): Payer: Medicare HMO | Admitting: Family

## 2022-07-23 ENCOUNTER — Other Ambulatory Visit
Admission: RE | Admit: 2022-07-23 | Discharge: 2022-07-23 | Disposition: A | Discharge status: Home / Self Care | Payer: Medicare HMO | Source: Ambulatory Visit | Attending: Family | Admitting: Family

## 2022-07-23 VITALS — BP 132/91 | HR 99 | Resp 18 | Wt 389.0 lb

## 2022-07-23 DIAGNOSIS — Z87891 Personal history of nicotine dependence: Secondary | ICD-10-CM | POA: Insufficient documentation

## 2022-07-23 DIAGNOSIS — Z7984 Long term (current) use of oral hypoglycemic drugs: Secondary | ICD-10-CM | POA: Insufficient documentation

## 2022-07-23 DIAGNOSIS — I890 Lymphedema, not elsewhere classified: Secondary | ICD-10-CM

## 2022-07-23 DIAGNOSIS — R220 Localized swelling, mass and lump, head: Secondary | ICD-10-CM | POA: Insufficient documentation

## 2022-07-23 DIAGNOSIS — I130 Hypertensive heart and chronic kidney disease with heart failure and stage 1 through stage 4 chronic kidney disease, or unspecified chronic kidney disease: Secondary | ICD-10-CM | POA: Insufficient documentation

## 2022-07-23 DIAGNOSIS — J45909 Unspecified asthma, uncomplicated: Secondary | ICD-10-CM | POA: Insufficient documentation

## 2022-07-23 DIAGNOSIS — I5032 Chronic diastolic (congestive) heart failure: Secondary | ICD-10-CM | POA: Insufficient documentation

## 2022-07-23 DIAGNOSIS — Z79899 Other long term (current) drug therapy: Secondary | ICD-10-CM | POA: Insufficient documentation

## 2022-07-23 DIAGNOSIS — M1990 Unspecified osteoarthritis, unspecified site: Secondary | ICD-10-CM | POA: Insufficient documentation

## 2022-07-23 DIAGNOSIS — G8929 Other chronic pain: Secondary | ICD-10-CM | POA: Insufficient documentation

## 2022-07-23 DIAGNOSIS — N183 Chronic kidney disease, stage 3 unspecified: Secondary | ICD-10-CM | POA: Insufficient documentation

## 2022-07-23 DIAGNOSIS — G4733 Obstructive sleep apnea (adult) (pediatric): Secondary | ICD-10-CM | POA: Diagnosis not present

## 2022-07-23 DIAGNOSIS — R5383 Other fatigue: Secondary | ICD-10-CM | POA: Diagnosis present

## 2022-07-23 DIAGNOSIS — I10 Essential (primary) hypertension: Secondary | ICD-10-CM

## 2022-07-23 DIAGNOSIS — R42 Dizziness and giddiness: Secondary | ICD-10-CM | POA: Insufficient documentation

## 2022-07-23 DIAGNOSIS — R002 Palpitations: Secondary | ICD-10-CM | POA: Insufficient documentation

## 2022-07-23 DIAGNOSIS — E669 Obesity, unspecified: Secondary | ICD-10-CM | POA: Insufficient documentation

## 2022-07-23 DIAGNOSIS — G4730 Sleep apnea, unspecified: Secondary | ICD-10-CM | POA: Insufficient documentation

## 2022-07-23 LAB — BASIC METABOLIC PANEL
Anion gap: 11 (ref 5–15)
BUN: 27 mg/dL — ABNORMAL HIGH (ref 8–23)
CO2: 25 mmol/L (ref 22–32)
Calcium: 8.4 mg/dL — ABNORMAL LOW (ref 8.9–10.3)
Chloride: 101 mmol/L (ref 98–111)
Creatinine, Ser: 1.32 mg/dL — ABNORMAL HIGH (ref 0.44–1.00)
GFR, Estimated: 45 mL/min — ABNORMAL LOW (ref >60)
Glucose, Bld: 89 mg/dL (ref 70–99)
Potassium: 3.7 mmol/L (ref 3.5–5.1)
Sodium: 137 mmol/L (ref 135–145)

## 2022-07-23 NOTE — Progress Notes (Signed)
[facility] - PHARMACIST COUNSELING NOTE  Guideline-Directed Medical Therapy/Evidence Based Medicine  ACE/ARB/ARNI:  None (angioedema with ACEi) Beta Blocker:  None Aldosterone Antagonist:  None Diuretic: Bumetanide 2 mg daily  + Metolazone 2.5 mg BIW (TuSa) SGLT2i: Dapagliflozin 10 mg daily  Adherence Assessment  Do you ever forget to take your medication? '[]'$ Yes '[x]'$ No  Do you ever skip doses due to side effects? '[x]'$ Yes '[]'$ No  Do you have trouble affording your medicines? '[]'$ Yes '[x]'$ No  Are you ever unable to pick up your medication due to transportation difficulties? '[]'$ Yes '[x]'$ No  Do you ever stop taking your medications because you don't believe they are helping? '[]'$ Yes '[x]'$ No  Do you check your weight daily? '[]'$ Yes '[x]'$ No   Adherence strategy: Has tried pill boxes and does not like, so she keeps medicine bottles in a bag on her counter which she takes them from  Barriers to obtaining medications: None  Vital signs: HR 99, BP 132/91, weight (pounds) 389 ECHO: Date 10/2021, EF 60-65%, notes Trivial TV regurg     Latest Ref Rng & Units 06/20/2022   11:05 AM 05/16/2022    6:54 AM 05/15/2022    6:48 AM  BMP  Glucose 70 - 99 mg/dL 95  91  91   BUN 8 - 23 mg/dL 32  19  19   Creatinine 0.44 - 1.00 mg/dL 1.18  1.02  1.14   Sodium 135 - 145 mmol/L 139  142  140   Potassium 3.5 - 5.1 mmol/L 3.4  4.3  4.2   Chloride 98 - 111 mmol/L 104  107  106   CO2 22 - 32 mmol/L '26  30  29   '$ Calcium 8.9 - 10.3 mg/dL 8.9  8.6  8.6     Past Medical History:  Diagnosis Date   Arthritis    Asthma    CHF (congestive heart failure) (HCC)    Enlarged heart    Hypertension    Lymphedema    Sleep apnea    Spinal stenosis     ASSESSMENT 67 year old female with PMH HTN, CKD3a (BL Scr 1.4), lymphedema who presents to the HF clinic for follow-up. Most recent ECHO in 10/2021 shows EF 60-65%. Regarding GDMT, patient is taking Farxiga 10 mg daily, which was  started last visit on 06/20/2022. Patient also takes bumetanide 2 mg daily, and metolazone 2.5 mg twice weekly (on Tues and Sat), and potassium 20 mEq twice daily. Patient endorses that occasionally she will skip a bumetanide dose when she feels that she excessively urinated the day prior. Patient is down 10 lbs from prior visit >> congratulated patient on this very good accomplishment. Patient has trouble weighing herself at home due to lymphedema but has found out from insurance rep that they will potentially reimburse her for getting a scale, so patient plans to reach out to medical supply store if they have ideas for scales.  Recent ED Visit (past 6 months):  Date - 05/10/2022, CC - Cellulitis Date - 04/03/2022, CC - Cellulitis  PLAN CHF/HTN Recommend rechecking BMP due to starting SGLT2i four weeks ago Recommend spironolactone 12.5 mg daily after rechecking BMP Potassium is consistently on low end of normal Would enable her to potentially come down on potassium supplementation and potentially the metolazone as well due to diuretic effect of spironolactone  Time spent: 15 minutes  Will M. Ouida Sills, PharmD PGY-1 Pharmacy Resident 07/23/2022 11:31 AM   Current Outpatient Medications:    acetaminophen (  TYLENOL) 500 MG tablet, Take 500-1,000 mg by mouth every 6 (six) hours as needed for mild pain or fever., Disp: , Rfl:    albuterol (VENTOLIN HFA) 108 (90 Base) MCG/ACT inhaler, Inhale 1-2 puffs into the lungs every 6 (six) hours as needed for wheezing or shortness of breath., Disp: , Rfl:    bumetanide (BUMEX) 2 MG tablet, Take 1 tablet (2 mg total) by mouth daily., Disp: 30 tablet, Rfl: 5   cephALEXin (KEFLEX) 500 MG capsule, Take 2 capsules (1,000 mg total) by mouth daily. Hold these prophylactic antibiotics while you are taking the other antibiotics from hospital (Patient not taking: Reported on 07/23/2022), Disp: , Rfl:    cetirizine (ZYRTEC) 10 MG tablet, Take 1 tablet (10 mg total) by  mouth daily. (Patient taking differently: Take 10 mg by mouth daily as needed for allergies.), Disp: 60 tablet, Rfl: 0   cyanocobalamin 1000 MCG tablet, Take 1,000 mcg by mouth every other day. (Patient not taking: Reported on 07/23/2022), Disp: , Rfl:    dapagliflozin propanediol (FARXIGA) 10 MG TABS tablet, Take 1 tablet (10 mg total) by mouth daily before breakfast., Disp: 30 tablet, Rfl: 5   metolazone (ZAROXOLYN) 5 MG tablet, Take 0.5 tablets (2.5 mg total) by mouth 2 (two) times a week. (Patient taking differently: Take 2.5 mg by mouth 2 (two) times a week. Tues and sat), Disp: 12 tablet, Rfl: 3   NYSTATIN powder, Apply 1 Application topically 2 (two) times daily as needed (Yeast Skin infection)., Disp: , Rfl:    potassium chloride SA (KLOR-CON M) 20 MEQ tablet, Take 2 tablets (40 mEq total) by mouth daily., Disp: 180 tablet, Rfl: 3   senna-docusate (SENOKOT-S) 8.6-50 MG tablet, Take 1 tablet by mouth 2 (two) times daily as needed for mild constipation or moderate constipation., Disp: , Rfl:    DRUGS TO CAUTION IN HEART FAILURE  Drug or Class Mechanism  Analgesics NSAIDs COX-2 inhibitors Glucocorticoids  Sodium and water retention, increased systemic vascular resistance, decreased response to diuretics   Diabetes Medications Metformin Thiazolidinediones Rosiglitazone (Avandia) Pioglitazone (Actos) DPP4 Inhibitors Saxagliptin (Onglyza) Sitagliptin (Januvia)   Lactic acidosis Possible calcium channel blockade   Unknown  Antiarrhythmics Class I  Flecainide Disopyramide Class III Sotalol Other Dronedarone  Negative inotrope, proarrhythmic   Proarrhythmic, beta blockade  Negative inotrope  Antihypertensives Alpha Blockers Doxazosin Calcium Channel Blockers Diltiazem Verapamil Nifedipine Central Alpha Adrenergics Moxonidine Peripheral Vasodilators Minoxidil  Increases renin and aldosterone  Negative inotrope    Possible sympathetic withdrawal  Unknown   Anti-infective Itraconazole Amphotericin B  Negative inotrope Unknown  Hematologic Anagrelide Cilostazol   Possible inhibition of PD IV Inhibition of PD III causing arrhythmias  Neurologic/Psychiatric Stimulants Anti-Seizure Drugs Carbamazepine Pregabalin Antidepressants Tricyclics Citalopram Parkinsons Bromocriptine Pergolide Pramipexole Antipsychotics Clozapine Antimigraine Ergotamine Methysergide Appetite suppressants Bipolar Lithium  Peripheral alpha and beta agonist activity  Negative inotrope and chronotrope Calcium channel blockade  Negative inotrope, proarrhythmic Dose-dependent QT prolongation  Excessive serotonin activity/valvular damage Excessive serotonin activity/valvular damage Unknown  IgE mediated hypersensitivy, calcium channel blockade  Excessive serotonin activity/valvular damage Excessive serotonin activity/valvular damage Valvular damage  Direct myofibrillar degeneration, adrenergic stimulation  Antimalarials Chloroquine Hydroxychloroquine Intracellular inhibition of lysosomal enzymes  Urologic Agents Alpha Blockers Doxazosin Prazosin Tamsulosin Terazosin  Increased renin and aldosterone  Adapted from Page Carleene Overlie, et al. "Drugs That May Cause or Exacerbate Heart Failure: A Scientific Statement from the American Heart  Association." Circulation 2016; 134:e32-e69. DOI: 10.1161/CIR.0000000000000426   MEDICATION ADHERENCES TIPS AND STRATEGIES Taking medication as prescribed  improves patient outcomes in heart failure (reduces hospitalizations, improves symptoms, increases survival) Side effects of medications can be managed by decreasing doses, switching agents, stopping drugs, or adding additional therapy. Please let someone in the [facility] know if you have having bothersome side effects so we can modify your regimen. Do not alter your medication regimen without talking to [place].  Medication reminders can help patients remember  to take drugs on time. If you are missing or forgetting doses you can try linking behaviors, using pill boxes, or an electronic reminder like an alarm on your phone or an app. Some people can also get automated phone calls as medication reminders.

## 2022-07-23 NOTE — Patient Instructions (Signed)
If you have voicemail, please make sure your mailbox is cleaned out so that we may leave a message and please make sure to listen to any voicemails.    If you receive a satisfaction survey regarding the [facility], please take the time to fill it out. This way we can continue to provide excellent care and make any changes that need to be made.

## 2022-07-24 ENCOUNTER — Telehealth: Payer: Self-pay

## 2022-07-24 NOTE — Telephone Encounter (Addendum)
Unable to reach patient ----- Message from Alisa Graff, FNP sent at 07/24/2022  8:13 AM EST ----- Kidney function in your range. Will begin spironolactone '25mg'$  tablet as 1/2 tablet daily. RX needs sent in and then labs in 1 week. Continue all other meds at this time

## 2022-07-25 ENCOUNTER — Other Ambulatory Visit: Payer: Self-pay | Admitting: Family

## 2022-07-25 ENCOUNTER — Telehealth: Payer: Self-pay | Admitting: Family

## 2022-07-25 DIAGNOSIS — I5032 Chronic diastolic (congestive) heart failure: Secondary | ICD-10-CM

## 2022-07-25 MED ORDER — SPIRONOLACTONE 25 MG PO TABS: 12.5000 mg | ORAL_TABLET | Freq: Every day | ORAL | 3 refills | Status: DC

## 2022-07-25 NOTE — Telephone Encounter (Signed)
Spoke with patient regarding BMP results obtained a couple of days ago. Will start spironolactone 12.'5mg'$  daily and recheck labs in 1 week. Will continue all other medications at this time. She verbalized understanding.

## 2022-07-26 ENCOUNTER — Encounter: Payer: Medicare HMO | Attending: Physician Assistant | Admitting: Physician Assistant

## 2022-07-26 DIAGNOSIS — K219 Gastro-esophageal reflux disease without esophagitis: Secondary | ICD-10-CM | POA: Insufficient documentation

## 2022-07-26 DIAGNOSIS — L97811 Non-pressure chronic ulcer of other part of right lower leg limited to breakdown of skin: Secondary | ICD-10-CM | POA: Insufficient documentation

## 2022-07-26 DIAGNOSIS — G4733 Obstructive sleep apnea (adult) (pediatric): Secondary | ICD-10-CM | POA: Insufficient documentation

## 2022-07-26 DIAGNOSIS — I509 Heart failure, unspecified: Secondary | ICD-10-CM | POA: Diagnosis not present

## 2022-07-26 DIAGNOSIS — Q820 Hereditary lymphedema: Secondary | ICD-10-CM | POA: Diagnosis not present

## 2022-07-26 DIAGNOSIS — L97821 Non-pressure chronic ulcer of other part of left lower leg limited to breakdown of skin: Secondary | ICD-10-CM | POA: Diagnosis not present

## 2022-07-26 DIAGNOSIS — I890 Lymphedema, not elsewhere classified: Secondary | ICD-10-CM | POA: Insufficient documentation

## 2022-07-26 DIAGNOSIS — I110 Hypertensive heart disease with heart failure: Secondary | ICD-10-CM | POA: Diagnosis not present

## 2022-07-26 NOTE — Progress Notes (Addendum)
Elizabeth, [name] ([id number]) [id number] Page 1 of 8 Visit Report for 07/26/2022 Arrival Information Details Patient Name: Date of Service: [name], [name] 07/26/2022 1:00 PM Medical Record Number: [id number] Patient Account Number: [id number] Date of Birth/Sex: Treating RN: 03/13/1956 (67 y.o. Charolette Forward, Kim Primary Care Timur Nibert: Ranae Plumber Other Clinician: Massie Kluver Referring Macenzie Burford: Treating Kailia Starry/Extender: Skeet Simmer in Treatment: 18 Visit Information History Since Last Visit All ordered tests and consults were completed: No Patient Arrived: Gilford Rile Added or deleted any medications: No Arrival Time: 13:01 Any new allergies or adverse reactions: No Transfer Assistance: None Had a fall or experienced change in No Patient Identification Verified: Yes activities of daily living that may affect Secondary Verification Process Completed: Yes risk of falls: Patient Requires Transmission-Based Precautions: No Signs or symptoms of abuse/neglect since last visito No Patient Has Alerts: Yes Hospitalized since last visit: No Patient Alerts: Borderline Diabetic Implantable device outside of the clinic excluding No cellular tissue based products placed in the center since last visit: Has Dressing in Place as Prescribed: Yes Pain Present Now: Yes Electronic Signature(s) Signed: 07/27/2022 9:50:53 AM By: Massie Kluver Entered By: Massie Kluver on 07/26/2022 13:22:29 -------------------------------------------------------------------------------- Clinic Level of Care Assessment Details Patient Name: Date of Service: Elizabeth, [name] 07/26/2022 1:00 PM Medical Record Number: [id number] Patient Account Number: [id number] Date of Birth/Sex: Treating RN: [birth year] (67 y.o. Marlowe Shores Primary Care Jayven Naill: Ranae Plumber Other Clinician: Massie Kluver Referring Storm Dulski: Treating Asiana Benninger/Extender: Skeet Simmer in Treatment: [place] Assessment Items TOOL 4 Quantity Score '[]'$  - 0 Use when only an EandM is performed on FOLLOW-UP visit ASSESSMENTS - Nursing Assessment / Reassessment X- 1 10 Reassessment of Co-morbidities (includes updates in patient status) X- 1 5 Reassessment of Adherence to Treatment Plan [name], [name] ([id number]) [id number] Page 2 of 8 ASSESSMENTS - Wound and Skin A ssessment / Reassessment X - Simple Wound Assessment / Reassessment - one wound 1 5 '[]'$  - 0 Complex Wound Assessment / Reassessment - multiple wounds '[]'$  - 0 Dermatologic / Skin Assessment (not related to wound area) ASSESSMENTS - Focused Assessment '[]'$  - 0 Circumferential Edema Measurements - multi extremities '[]'$  - 0 Nutritional Assessment / Counseling / Intervention '[]'$  - 0 Lower Extremity Assessment (monofilament, tuning fork, pulses) '[]'$  - 0 Peripheral Arterial Disease Assessment (using hand held doppler) ASSESSMENTS - Ostomy and/or Continence Assessment and Care '[]'$  - 0 Incontinence Assessment and Management '[]'$  - 0 Ostomy Care Assessment and Management (repouching, etc.) PROCESS - Coordination of Care X - Simple Patient / Family Education for ongoing care 1 15 '[]'$  - 0 Complex (extensive) Patient / Family Education for ongoing care '[]'$  - 0 Staff obtains Programmer, systems, Records, T Results / Process Orders est '[]'$  - 0 Staff telephones HHA, Nursing Homes / Clarify orders / etc '[]'$  - 0 Routine Transfer to another Facility (non-emergent condition) '[]'$  - 0 Routine Hospital Admission (non-emergent condition) '[]'$  - 0 New Admissions / Biomedical engineer / Ordering NPWT Apligraf, etc. , '[]'$  - 0 Emergency Hospital Admission (emergent condition) X- 1 10 Simple Discharge Coordination '[]'$  - 0 Complex (extensive) Discharge Coordination PROCESS - Special Needs '[]'$  - 0 Pediatric / Minor Patient Management '[]'$  - 0 Isolation Patient Management '[]'$  - 0 Hearing /  Language / Visual special needs '[]'$  - 0 Assessment of Community assistance (transportation, D/C planning, etc.) '[]'$  - 0 Additional assistance / Altered mentation '[]'$  - 0 Support Surface(s) Assessment (bed, cushion, seat, etc.)  INTERVENTIONS - Wound Cleansing / Measurement X - Simple Wound Cleansing - one wound 1 5 '[]'$  - 0 Complex Wound Cleansing - multiple wounds '[]'$  - 0 Wound Imaging (photographs - any number of wounds) '[]'$  - 0 Wound Tracing (instead of photographs) '[]'$  - 0 Simple Wound Measurement - one wound '[]'$  - 0 Complex Wound Measurement - multiple wounds INTERVENTIONS - Wound Dressings '[]'$  - 0 Small Wound Dressing one or multiple wounds X- 1 15 Medium Wound Dressing one or multiple wounds '[]'$  - 0 Large Wound Dressing one or multiple wounds '[]'$  - 0 Application of Medications - topical '[]'$  - 0 Application of Medications - injection INTERVENTIONS - Miscellaneous '[]'$  - 0 External ear exam [name], [name] ([id number]) [id number] Page 3 of 8 '[]'$  - 0 Specimen Collection (cultures, biopsies, blood, body fluids, etc.) '[]'$  - 0 Specimen(s) / Culture(s) sent or taken to Lab for analysis '[]'$  - 0 Patient Transfer (multiple staff / Harrel Lemon Lift / Similar devices) '[]'$  - 0 Simple Staple / Suture removal (25 or less) '[]'$  - 0 Complex Staple / Suture removal (26 or more) '[]'$  - 0 Hypo / Hyperglycemic Management (close monitor of Blood Glucose) '[]'$  - 0 Ankle / Brachial Index (ABI) - do not check if billed separately X- 1 5 Vital Signs Has the patient been seen at the hospital within the last three years: Yes Total Score: 70 Level Of Care: New/Established - Level 2 Electronic Signature(s) Signed: 07/27/2022 9:50:53 AM By: Massie Kluver Entered By: Massie Kluver on 07/26/2022 13:46:24 -------------------------------------------------------------------------------- Encounter Discharge Information Details Patient Name: Date of Service: [name] 07/26/2022 1:00  PM Medical Record Number: [id number] Patient Account Number: [id number] Date of Birth/Sex: Treating RN: [birth year] (67 y.o. Charolette Forward, Kim Primary Care Bonnee Zertuche: Ranae Plumber Other Clinician: Massie Kluver Referring Abel Hageman: Treating Jodell Weitman/Extender: Skeet Simmer in Treatment: [phone] Encounter Discharge Information Items Discharge Condition: Stable Ambulatory Status: Walker Discharge Destination: Home Transportation: Private Auto Accompanied By: self Schedule Follow-up Appointment: Yes Clinical Summary of Care: Electronic Signature(s) Signed: 07/27/2022 9:50:53 AM By: Massie Kluver Entered By: Massie Kluver on 07/26/2022 14:04:45 Multi Wound Chart Details -------------------------------------------------------------------------------- [name] ([id number]) [id number] Page 4 of 8 Patient Name: Date of Service: ASHAWNTI, TANGEN 07/26/2022 1:00 PM Medical Record Number: [id number] Patient Account Number: [id number] Date of Birth/Sex: Treating RN: 1955-08-21 (67 y.o. Marlowe Shores Primary Care Jarred Purtee: Ranae Plumber Other Clinician: Massie Kluver Referring Lexington Krotz: Treating Aashritha Miedema/Extender: Skeet Simmer in Treatment: 51 Vital Signs Height(in): 63 Pulse(bpm): 87 Weight(lbs): 372 Blood Pressure(mmHg): 118/76 Body Mass Index(BMI): 65.9 Temperature(F): 98.3 Respiratory Rate(breaths/min): 18 Wound Assessments Treatment Notes Electronic Signature(s) Signed: 07/27/2022 9:50:53 AM By: Massie Kluver Entered By: Massie Kluver on 07/26/2022 13:22:37 -------------------------------------------------------------------------------- Multi-Disciplinary Care Plan Details Patient Name: Date of Service: BERNARDETTE, WALDRON 07/26/2022 1:00 PM Medical Record Number: [id number] Patient Account Number: [id number] Date of Birth/Sex: Treating RN: 1955-12-27 (67 y.o. Marlowe Shores Primary Care Mardi Cannady: Ranae Plumber Other Clinician: Massie Kluver Referring Elsey Holts: Treating Jenan Ellegood/Extender: Skeet Simmer in Treatment: 47 Active Inactive Wound/Skin Impairment Nursing Diagnoses: Impaired tissue integrity Knowledge deficit related to ulceration/compromised skin integrity Goals: Ulcer/skin breakdown will have a volume reduction of 30% by week 4 Date Initiated: 08/01/2021 Target Resolution Date: 08/29/2021 Goal Status: Active Ulcer/skin breakdown will have a volume reduction of 50% by week 8 Date Initiated: 08/01/2021 Target Resolution Date: 09/26/2021 Goal Status: Active Ulcer/skin breakdown will have a volume reduction of 80% by week 12 Date  Initiated: 08/01/2021 Target Resolution Date: 10/24/2021 Goal Status: Active Ulcer/skin breakdown will heal within 14 weeks Date Initiated: 08/01/2021 Target Resolution Date: 11/07/2021 Goal Status: Active Interventions: Assess patient/caregiver ability to obtain necessary supplies Assess patient/caregiver ability to perform ulcer/skin care regimen upon admission and as needed Assess ulceration(s) every visit Provide education on ulcer and skin care SARIN, COMUNALE ([id number]) [id number] Page 5 of 8 Notes: Electronic Signature(s) Signed: 07/26/2022 6:11:46 PM By: Gretta Cool, BSN, RN, CWS, Kim RN, BSN Signed: 07/27/2022 9:50:53 AM By: Massie Kluver Entered By: Massie Kluver on 07/26/2022 14:04:08 -------------------------------------------------------------------------------- Non-Wound Condition Assessment Details Patient Name: Date of Service: DUANA, BENEDICT 07/26/2022 1:00 PM Medical Record Number: [id number] Patient Account Number: [id number] Date of Birth/Sex: Treating RN: 05/27/1956 (67 y.o. Marlowe Shores Primary Care Carlean Crowl: Ranae Plumber Other Clinician: Massie Kluver Referring Ramondo Dietze: Treating Eschol Auxier/Extender: Skeet Simmer in Treatment: 51 Non-Wound  Condition: Condition: Lymphedema Location: Leg Side: Right Notes: Right leg with decreased drainage this visit. Notes swelling down, moderate drainage noted to right medial and lateral lower leg, pain 1/10 during visit Electronic Signature(s) Signed: 07/26/2022 6:11:46 PM By: Gretta Cool, BSN, RN, CWS, Kim RN, BSN Signed: 07/27/2022 9:50:53 AM By: Massie Kluver Entered By: Massie Kluver on 07/26/2022 13:22:17 -------------------------------------------------------------------------------- Non-Wound Condition Assessment Details Patient Name: Date of Service: ROSIA, SYME 07/26/2022 1:00 PM Medical Record Number: [id number] Patient Account Number: [id number] Date of Birth/Sex: Treating RN: [birth year] (67 y.o. Marlowe Shores Primary Care Vinessa Macconnell: Ranae Plumber Other Clinician: Massie Kluver Referring Dejaun Vidrio: Treating Donis Pinder/Extender: Skeet Simmer in Treatment: 39 Non-Wound Condition: Condition: Lymphedema Location: Leg Side: Left Notes: decreased drainage this visit. KAAREN, NASS ([id number]) [id number] Page 6 of 8 Notes swelling down, no drainage noted, no pain or discomfort noted to left lower leg Electronic Signature(s) Signed: 07/26/2022 6:11:46 PM By: Gretta Cool, BSN, RN, CWS, Kim RN, BSN Signed: 07/27/2022 9:50:53 AM By: Massie Kluver Entered By: Massie Kluver on 07/26/2022 13:22:21 -------------------------------------------------------------------------------- Pain Assessment Details Patient Name: Date of Service: ZOII, FLORER 07/26/2022 1:00 PM Medical Record Number: [id number] Patient Account Number: [id number] Date of Birth/Sex: Treating RN: Jan 11, 1956 (67 y.o. Marlowe Shores Primary Care Shawnna Pancake: Ranae Plumber Other Clinician: Massie Kluver Referring Cartez Mogle: Treating Jazmine Longshore/Extender: Skeet Simmer in Treatment: 47 Active Problems Location of Pain Severity and Description of  Pain Patient Has Paino Yes Site Locations Pain Location: Pain in Ulcers Duration of the Pain. Constant / Intermittento Intermittent Rate the pain. Current Pain Level: 1 Worst Pain Level: 5 Character of Pain Describe the Pain: Aching, Throbbing Pain Management and Medication Current Pain Management: Medication: Yes Cold Application: No Rest: No Massage: No Activity: Yes T.E.N.S.: No Heat Application: No Leg drop or elevation: No Is the Current Pain Management Adequate: Inadequate How does your wound impact your activities of daily livingo Sleep: No Bathing: No Appetite: No Relationship With Others: No Bladder Continence: No Emotions: No Bowel Continence: No Work: No Toileting: No Drive: No Dressing: No Hobbies: No Electronic Signature(s) MADELINE, PHO ([id number]) [id number] Page 7 of 8 Signed: 07/26/2022 6:11:46 PM By: Gretta Cool, BSN, RN, CWS, Kim RN, BSN Signed: 07/27/2022 9:50:53 AM By: Massie Kluver Entered By: Massie Kluver on 07/26/2022 13:11:39 -------------------------------------------------------------------------------- Patient/Caregiver Education Details Patient Name: Date of Service: KIMRA, KANTOR 1/18/2024andnbsp1:00 PM Medical Record Number: [id number] Patient Account Number: [id number] Date of Birth/Gender: Treating RN: [birth year] (67 y.o. Marlowe Shores Primary Care Physician: Ranae Plumber Other Clinician: Massie Kluver Referring Physician: Treating Physician/Extender: Joaquim Lai,  Doree Barthel, Vaughan Basta Weeks in Treatment: 72 Education Assessment Education Provided To: Patient Education Topics Provided Wound/Skin Impairment: Handouts: Other: continue wound care as directed Methods: Explain/Verbal Responses: State content correctly Electronic Signature(s) Signed: 07/27/2022 9:50:53 AM By: Massie Kluver Entered By: Massie Kluver on 07/26/2022  14:04:03 -------------------------------------------------------------------------------- Vitals Details Patient Name: Date of Service: [name]. 07/26/2022 1:00 PM Medical Record Number: [id number] Patient Account Number: [id number] Date of Birth/Sex: Treating RN: 30-Mar-1956 (67 y.o. Charolette Forward, Kim Primary Care Salaya Holtrop: Ranae Plumber Other Clinician: Massie Kluver Referring Burnard Enis: Treating Winthrop Shannahan/Extender: Skeet Simmer in Treatment: 51 Vital Signs Time Taken: 13:04 Temperature (F): 98.3 Height (in): 63 Pulse (bpm): 87 Weight (lbs): 372 Respiratory Rate (breaths/min): 18 Body Mass Index (BMI): 65.9 Blood Pressure (mmHg): 118/76 Reference Range: 80 - 120 mg / dl AUBREIGH, FUERTE C ([id number]) [id number] Page 8 of 8 Electronic Signature(s) Signed: 07/27/2022 9:50:53 AM By: Massie Kluver Entered By: Massie Kluver on 07/26/2022 13:11:34

## 2022-07-26 NOTE — Progress Notes (Addendum)
[name], [name] ([id number]) [id number] Page 1 of 14 Visit Report for 07/26/2022 Chief Complaint Document Details Patient Name: Date of Service: Elizabeth, [name] 07/26/2022 1:00 PM Medical Record Number: [id number] Patient Account Number: [id number] Date of Birth/Sex: Treating RN: [birth year] (67 y.o. [place] Primary Care Provider: Ranae Plumber Other Clinician: Massie Kluver Referring Provider: Treating Provider/Extender: Skeet Simmer in Treatment: 5 Information Obtained from: Patient Chief Complaint Bilateral LE lymphedema with Left LE Ulcers Electronic Signature(s) Signed: 07/26/2022 12:59:53 PM By: Worthy Keeler PA-C Entered By: Worthy Keeler on 07/26/2022 12:59:52 -------------------------------------------------------------------------------- HPI Details Patient Name: Date of Service: Elizabeth, [name] 07/26/2022 1:00 PM Medical Record Number: [id number] Patient Account Number: [id number] Date of Birth/Sex: Treating RN: 04-24-1956 (67 y.o. [place] Primary Care Provider: Ranae Plumber Other Clinician: Massie Kluver Referring Provider: Treating Provider/Extender: Skeet Simmer in Treatment: 60 History of Present Illness HPI Description: The patient is a 67 year old female with history of hypertension and a long-standing history of bilateral lower extremity lymphedema (first presented on 4/2) . She has had open ulcers in the past which have always responded to compression therapy. She had briefly been to a lymphedema clinic in the past which helped her at the time. this time around she stopped treatment of her lymphedema pumps approximately 2 weeks ago because of some pain in the knees and then noticed the right leg getting worse. She was seen by her PCP who put her on clindamycin 4 times a day 2 days ago. The patient has seen [facility] and Dr. Delana Meyer had seen her last year where a vascular study  including venous and arterial duplex studies were within normal limits. he had recommended compression stockings and lymphedema pumps and the patient has been using this in about 2 weeks ago. She is known to be diabetic but in the past few time she's gone to her primary care doctor her hemoglobin A1c has been normal. 02/11/2015 - after her last visit she took my advice and went to the ER regarding the progressive cellulitis of her right lower extremity and she was admitted between July 17 and 22nd. She received IV antibiotics and then was sent home on a course of steroid-induced and oral antibiotics. She has improved much since then. 02/17/2015 -- she has been doing fine and the weeping of her legs has remarkably gone down. She has no fresh issues. Elizabeth, [name] ([id number]) [id number] Page 2 of 14 01/15/18 This patient was given this clinic before most recently in 2016 seen by Dr. Con Memos. She has massive bilateral lymphedema and over the last 2 months this had weeping edema out of the left leg. She has compression pumps but her compliance with these has been minimal. She has [facility] they've been using TCA/ABDs/kerlix under an Ace wrap.she has had recent problems with cellulitis. She was apparently seen in the ER and 12/23/17 and given clindamycin. She was then followed by her primary doctor and given doxycycline and Keflex. The pain seems to have settled down. In April 2018 the patient had arterial studies done at [facility]. This showed triphasic waveforms throughout the right leg and mostly triphasic waveforms on the left except for monophasic at the posterior tibial artery distally. She was not felt to have evidence of right lower extremity arterial stenosis or significant problems on the left side. 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 She also had a right lower extremity  venous Doppler in January  2018 this was limited by the patient's body habitus and lymphedema. Most of the proximal veins were not visualized The patient presents with an area of denuded skin on the anterior medial part of the left calf. There is weeping edema fluid here. 01/22/18; the patient has somewhat better edema control using her compression pumps twice a day and as a result she has much better epithelialization on the left anterior calf area. Only a small open area remains. 01/29/18; the patient has been compliant with her compression pumps. Both the areas on her calf that healed. The remaining area on the left anterior leg is fully epithelialized Readmission: 02/20/2019 upon evaluation today patient presents for reevaluation due to issues that she is having with the bilateral lower extremities. She actually has wounds open on both legs. On the right she has an area in the crease of her leg on the right around the knee region which is actually draining quite a bit and actually has some fungal type appearance to it. She has been on nystatin powder that seems to have helped to some degree. In regard to the left lower extremity this is actually in the lower portion of her leg closer to the ankle and again is continuing to drain as well unfortunately. There does not appear to be any signs of active infection at this time which is good news. No fevers, chills, nausea, vomiting, or diarrhea. She tells me that since she was seen last year she is actually been doing quite well for the most part with regard to her lower extremities. Unfortunately she now is experiencing a little bit more drainage at this time. She is concerned about getting this under control so that it does not get significantly worse. 02/27/2019 on evaluation today patient appears to be doing somewhat better in regard to her bilateral lower extremity wounds. She has been tolerating the dressing changes without complication. Fortunately there is no signs of active  infection at this point. No fevers, chills, nausea, vomiting, or diarrhea. She did get her dressing supplies which is excellent news she was extremely excited to get these. She also got paperwork from [employer] for their financial assistance program where they may be able to help her out in the future if needed with supplies at discounted prices. 03/06/2019 on evaluation today patient appears to be doing a little worse with regard to both areas of weeping on her bilateral lower extremities. This is around the right medial knee and just above the left ankle. With that being said she is unfortunately not doing as well as I would like to see. I feel like she may need to potentially go see someone at the lymphedema clinic as the wraps that she needs or even beyond what we can do here at the wound care center. She really does not have wounds she just has open areas of weeping that are causing some difficulty for her. Subsequently because of this and the moisture I am concerned about the potential for infection I am going to likely give her a prophylactic antibiotic today, Keflex, just to be on the safe side. Nonetheless again there is no obvious signs of active infection at this time. 03/13/2019 on evaluation today patient appears to be doing well with regard to her bilateral lower extremities where she has been weeping compared to even last week's evaluation. I see some areas of new skin growth which is excellent and overall I am very pleased with how things  seem to be progressing. No fevers, chills, nausea, vomiting, or diarrhea. 03/20/2019 on evaluation today patient unfortunately is continuing to have issues with significant edema of the left lower extremity. Her right side seems to be doing much better. Unfortunately her left side is showing increased weeping of the lower portion of her leg. This is quite unfortunate obviously we were hoping to get her into the lymphedema clinic they really do not seem to when  I see her how if she is draining. Despite the fact this is really not wound related but more lymphedema weeping related. Nonetheless I do not know that this can be helpful for her to even go for that appointment since again I am not sure there is much that they would actually do at this point. We may need to try a 4 layer compression wrap as best we can on her leg. She is on the Augmentin currently although I am still concerned about whether or not there could be potentially something going on infection wise I would obtain a culture though I understand is not the best being that is a surface culture I just 1 to make sure I do not seem to be missing anything. 03/27/2019 on evaluation today patient appears to be doing much better in regard to the left lower extremity compared to last week. Last week she had tremendous weeping which I think was subsequent to infection now she seems to be doing much better and very pleased. This is not completely healed but there is a lot of new skin growth and it has dried out quite a bit. Overall I think that we are doing well with how things are moving along at this time. No fevers, chills, nausea, vomiting, or diarrhea. 04/03/2019 evaluation today patient appears to be doing a little worse this week compared to last time I saw her. I think this may be due to the fact that she is having issues with not being able to sleep in her bed at least not until last night. She is therefore been in a lift chair and subsequently has also had issues with not been able to use her pumps since she could not get in bed. With that being said the patient overall seems to be doing okay I do think I may want extend the antibiotic for a little bit longer at least until we can see if her edema and her weeping gets better and if it is then obviously I can always discontinue the antibiotics as of next week however I want her to continue to have it over the next week. 04/10/2019 on evaluation today  patient unfortunately is still doing poorly with regard to her left lower extremity. Her right is all things considering doing fairly well. On the left however she continues to have spreading of the area of infection and weeping which appears to be even a larger surface area than noted last week. She did have a positive culture for Pseudomonas in particular which seems to have been of concern she still has green/yellow discharge consistent with Pseudomonas and subsequently a tremendous amount of it. This has me obviously still concerned about the infection not really clearing up despite the fact that on culture it appears the Cipro should have been a good option for treating this. I think she may at this point need IV antibiotics since things are not doing better I do not want to get worse and cause sepsis. She is in agreement with the plan and  believes as well that she likely does need to go to the hospital for IV vancomycin. Or something of the like depending on what the recommendation is from the ER. 04/17/2019 on evaluation today patient appears to be doing excellent in regard to her lower extremity on the left. She was in the hospital for several days from when I sent her last we saw her until just this past Tuesday. Fortunately her drainage is significantly improved and in fact is mostly clear. There is just a couple small areas that may still drain a little bit she states that the [facility] they prescribed for her at discharge she went picked up from pharmacy and got home but has not been able to find it since. She is looked everywhere. She is wondering if I will replace that for her today I will be more than happy to do that. 05/01/2019 on evaluation today patient actually appears to be doing quite well with regard to her lower extremities. She occasionally is having areas that will leak and then heal up mainly when a piece of the fibrotic skin pops off but fortunately she is not having any signs of  active infection at this time. Overall she also really does not have any obvious weeping at this time. I do believe however she really needs some compression wraps and I think this may be a good time to get her back to the lymphedema clinic. 05/11/2019 on evaluation today patient actually appears to be doing quite well with regard to her bilateral lower extremities. She occasionally will have a small area that we per another but in general seems to be completely healed which is great news. Overall very pleased with how everything seems to be progressing. She does have her appointment with lymphedema clinic on November 18. 05/25/2019 on evaluation today patient appears to be doing well with regard to her left lower extremity. I am very pleased in this regard. In regard to her right leg this actually did start draining more I think it is mainly due to the fact that her leg is more swollen. I am not seeing any obvious signs of infection at this time although that is definitely something were obviously acutely aware of simply due to the fact that she had an issue not too far back with exactly this issue. Nonetheless I do feel like that lymphedema clinic would still be beneficial for her. I explained obviously if they are not able to do anything treatment wise on the right leg we could at least have them treat her left leg and then proceed from there. The patient is really in agreement with that plan. If they are able to do both as the drainage slows down that I would be happy to let them handle both. Elizabeth, [name] ([id number]) [id number] Page 3 of 14 06/01/2019 on evaluation today patient unfortunately appears to be doing worse with regard to her right lower extremity. The left lower extremity is still maintaining at this point. Unfortunately she has been having significantly increased pain over the past several days and has been experiencing as well increased swelling of the  right lower extremity. I really do not know that I am seeing anything that appears to be obvious for infection at this point to be peripherally honest. With that being said the patient does seem to be having much more swelling that she is even experienced in the past and coupled with increased pain in her hip as well I am concerned that  again she could potentially have a DVT although I am not 100% sure of this. I think it something that may need to be checked out. We discussed the possibility of sending her for a DVT study through the hospital but unfortunately transportation is an issue if she does have a DVT I do not want her to wait days to be able to get in for that test however if she has this scheduled as an outpatient that is as fast that she will be able to get the test scheduled for transportation purposes. That will also fall on Thanksgiving so subsequently she did actually be looking at either Friday or even next week before we would know anything back from this. That is much too long in my opinion. Subsequent to the amount of discomfort she is experiencing the patient is actually okay with going to the ER for evaluation today. 06/12/2019 on evaluation today patient actually appears to be doing significantly better compared to last time I saw her. Following when I last saw her she was actually in the hospital from that Monday until the following Sunday almost 1 full week. She actually was placed on Keflex in the hospital following the time for her to be discharged and Dr. Steva Ready has recommended 2 times a day dosing of the Keflex for the next year in order to help with more prophylactic/preventative measures with regard to her developing cellulitis. Overall I think this sounds like an excellent plan. The patient unfortunately is good to have trouble being treated at lymphedema clinic due to the fact that she really cannot get up on the bed that they have there. They also state that  they cannot manage her as long as she has anything draining at this point. Obviously that is somewhat unfortunate as she does need help with edema control but nonetheless we will have to do what we can for her outside of it sounds like the lymphedema clinic scenario at this point. 06/19/2019 on evaluation today patient appears to be doing fairly well with regard to her bilateral lower extremities. She is not nearly as swollen and shows no signs of infection at this point. There is no evidence of cellulitis whatsoever. She also has no open wounds or draining at this point which is also good news. No fever chills noted. She seems to be in very good spirits and in fact appears to be doing quite well. READMISSION 11/27/2019 This is a 67 year old woman that we have had in this clinic several times before including 2015, 16 and 19 and then most recently from 03/20/2019 through 06/19/2019 with bilateral lower extremity lymphedema. She has had previous arterial and reflux studies done years ago which were not all that remarkable. 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 She does have a positive family history and she had large legs starting may be in her 59s. She was recently in hospital from 10/20/2019 through 10/28/2019 with right leg cellulitis. She was given Ancef and clindamycin and then Zosyn when a culture showed Pseudomonas. At that time there was purulent drainage. She was followed by infectious disease Dr Steva Ready. The patient is now back at home. She has noted increased swelling in the right and no drainage in her right leg mostly on the posterior medial aspect in the calf area. She has not had pain or fever. She has literally been improved lysing above dressings because her at the area of this is far too large  for standard compression. She has been wrapping the areas with sheets to resorptive pads. She is found these helped somewhat. She does  have an appointment with the lymphedema clinic in [place] in late June. Past medical history includes bilateral lymphedema, hypertension, obstructive sleep apnea with CPAP. Recent hospitalization with apparently Pseudomonas cellulitis of the right lower leg 12/15/2019 upon evaluation today patient appears to be doing a little bit worse in regard to her right lower extremity. Unfortunately she is having more weeping down in the lower portion of her leg. Fortunately there is no signs of active infection at this time. No fever chills noted. The patient states she is not having increased pain except for when she attempted to use the lymphedema pumps unfortunately she states that she did have pain when she did this. Otherwise we been using absorptive dressings of one type or another she is using diapers at home and then subsequently Ace wraps. In regard to the barrier cream we have discussed the possibility of derma cloud which she would like to try I do not have a problem with that. 12/22/2019 upon evaluation today patient actually appears to be doing better in regard to her leg ulcers at this point. Fortunately there does not appear to be any signs of active infection which is great news and I am extremely pleased with where things are progressing at this time. There is no sign of active infection currently. The patient is very pleased to see things doing so well. 12/29/2019 upon evaluation today patient appears to be doing a little bit better in regard to her weeping in general over her lower extremities. She does have some signs of mild erythema little bit more than what I noted last week or rather last visit. Nonetheless I think that my threshold for switching her antibiotics from Keflex to something else is very low at this point considering that she has had such severe infections in the past that seem to come almost out of nowhere. There is a little erythema and warmth noted of the lower portion of  her leg compared to the upper which also makes me want to go ahead and address things more rapidly at this point. Likely I would switch out the Keflex for something like Levaquin ideally. 7/16; patient with severe bilateral lymphedema. She has superficial wounds albeit almost circumferential now on the left lateral lower leg. This may be new from last time. Small area on the right anterior lower leg and then another area on the right medial lower leg and of pannus fold. She has been using various absorptive garments. She states she is using her compression pumps once a day occasionally twice. Culture from her last visit here was negative 01/29/2020 on evaluation today patient appears to be doing excellent at this point in regard to her legs with regard to infection I see no signs of active infection at this point. She still does have unfortunately areas of weeping this is minimal on the right now her left is actually significantly worse although I do not think it is as bad as last week with Dr. Dellia Nims saw her. She has been trying to pump and elevate her legs is much as possible. She has previously been on the Keflex and in the past for prevention that seems to do fairly well and likely can extend that today. 02/04/2020 on evaluation today patient appears to be doing better in regard to her legs bilaterally. Fortunately there is no signs of active infection  at this time which is great news and overall she has less weeping on the left compared to the right and there is several spots where she is pretty much sealed up with no draining regions. Overall very pleased in this regard. 02/19/2020 on evaluation today patient appears to be doing very well in regard to her wounds currently. Fortunately there is no evidence of active infection overall very pleased with where things stand. She is significantly improved in regard to her edema I am extremely pleased in this regard she tells me that the popping no longer  hurts and in fact she actually looks forward to it. 03/04/2020 on evaluation today patient appears to be doing excellent in regard to her lower extremities. Fortunately there is no signs of active infection at this time. No fevers, chills, nausea, vomiting, or diarrhea. 03/25/2020 on evaluation today patient appears to be doing a little bit more poorly in regard to her legs at this point. She tells me that she is still continue to have issues with drainage and this has been a little bit worse she was getting ready to start taking the Keflex again but wanted to see me first. Fortunately there is no signs of active infection at this time. No fevers, chills, nausea, vomiting, or diarrhea. 04/15/2020 upon evaluation today patient appears to be doing somewhat poorly in regard to her right leg. She tells me she has been having more pain she has been taking the Keflex that was previously prescribed unfortunately that just does not seem to help with this. She was hoping that the pain on her right was actually coming from the fact that she was having issues with her wrap having gotten caught in her recliner. With that being said she tells me that she knew something was not right. Currently her right leg is warm to touch along with being erythematous all the way up to around at least mid thigh as far as I can see. The left leg does not appear to be doing that badly though there is increased weeping around the ankle region. 05/06/2020 on evaluation today patient appears to be doing much better than last time I saw her. She did go to the hospital where she was admitted for 2 days and treated with antibiotic therapy. She was discharged with antibiotics as well and has done extremely well. I am extremely pleased with where things stand today. There is no signs of active infection at this time which is great news. 05/27/2020 upon evaluation today patient appears to be doing well with regard to her lower extremities  bilaterally. She has just a very tiny area on the right leg which is opening on the left leg she is significantly improved though she still has several areas that do appear to be open this is minimal compared to what is been in the past. In general I am extremely pleased with where things stand today. The patient does tell me she is not been using her pumps quite as much as Elizabeth, [name] ([id number]) [id number] Page 4 of 14 she should be. I do believe that is 1 area she can definitely work on. She has had a lot going on including a Covid exposure and apparently also a outbreak of likely shingles. 06/24/2020 upon evaluation today patient appears to be doing well with regard to her legs in general although the left leg unfortunately is showing some signs of erythema she does have a little bit of increased weeping and to be  honest I am concerned about infection here. I discussed that with her today and I think that we may need to address this sooner rather than later she has been taking Keflex she is not really certain that is been making a big improvement however. No fevers, chills, nausea, vomiting, or diarrhea. 06/30/2020 upon evaluation today patient's legs actually seem to be doing better in my opinion as compared to where they were last week. Fortunately there does not appear to be any signs of active infection. Her culture showed multiple organisms nothing predominate. With that being said the Levaquin seems to have done well I think she has improved since I last saw her as well. 07/14/2020 upon evaluation today patient appears to be doing actually better in regard to her lower extremities in my opinion. She has been tolerating the dressing changes without complication. Fortunately there is no signs of active infection at this time. 08/04/2020 upon evaluation today patient appears to be doing excellent in regard to her leg ulcers. Fortunately she has very little that is  open at this point. This is great news. In fact I think that the Goldbond medicated powder has been excellent for her. It seems to have done the trick where we had tried several other things without as much success. Fortunately there is no evidence of active infection at this time. No fevers, chills, nausea, vomiting, or diarrhea. 09/01/2020 upon evaluation today patient appears to be doing a little bit more poorly than the last time I saw her. She tells me right now that she has been having a lot of drainage compared to where things were previous which has unfortunately led to more irritation as well. She is concerned that this is leading to infection. Again based on what I am seeing today as well I am also concerned of the same to be honest. 09/15/2020 upon evaluation today patient appears to be doing well with regard to her legs compared to what I saw previous. Fortunately there does not appear to be any evidence of active infection at this time which is great news. At least not as badly as what it was previous. With that being said I do believe that the patient does need to have an extension of the antibiotics. This I believe will actually help her more in the way of making sure this stays under control and does not worsen. That was the Bactrim DS. Readmission: 08/01/2021 upon evaluation today patient appears to be doing actually pretty well all things considered. Is actually been almost a year since have seen her last March. Fortunately I do not see any evidence of active infection locally nor systemically at this point. She does have a couple open areas on the left leg the right leg appears to be doing quite well. In general she has been in the hospital 3 times since I saw her a year ago all 4 cellulitis type issues except for the last 1 which was actually more related to congestive heart failure for that reason she is not using her lymphedema pumps at this point and that is probably the  best idea. 08/08/2021 upon evaluation today patient appears to be doing well with regard to her legs all things considered her left leg may be a little bit more swollen based on what I see today. Fortunately I do not see any signs of active infection locally nor systemically at this time which is great news. No fevers, chills, nausea, vomiting, or diarrhea. 08/15/2021 upon evaluation today  patient actually appears to be doing excellent in regard to her wounds. She has been tolerating the dressing changes without complication. Fortunately I see no evidence of infection currently which is great news. No fevers, chills, nausea, vomiting, or diarrhea. 08/29/2021 upon evaluation patient appears to be doing excellent she in fact is almost completely healed. I am actually very pleased with where we stand today and I think that she is making wonderful progress she just has a small area of weeping on the right lateral leg everything else is pretty much completely healed which is also. 09/05/2021 upon evaluation today patient appears to be doing well with regard to her legs. She does have a small area of weeping on the right leg and there is a small area on the left leg as well. It is potentially something that may need to be addressed in the future more specifically but right now I think that there may have just been a little drainage here on the left. With all that being said I think that this still does not appear to be infected and overall I feel like she is doing quite well. That something we always have to keep a very close eye on as she can change very rapidly from okay to not okay. 09/12/2021 upon evaluation today patient appears to be doing a little bit worse in regard to her leg and actually somewhat concerned about the possibility of infection here. I discussed that with the patient. For that reason I am going to go ahead and see about getting her set up for a repeat prescription for the Bactrim. She is in  agreement with that plan. 3/14; patient presents for follow-up. She has been using silver alginate with dressing changes. She is still taking Bactrim prescribed at last clinic visit. She has no issues or complaints today. She has lymphedema pumps however does not use these. 09/26/2020 upon evaluation today patient appears to actually be doing well in regard to her right leg I am pleased in that regard. Unfortunately she has new areas on her left leg that are open at this point that I do need to be addressed. I am also concerned about the warmth around one of the wounds on the more anterior side. I think this is something that we may need to culture today potentially requiring antibiotics going forward. 10/03/2021 upon evaluation today patient unfortunately is not doing nearly as well as she was even last week with her wounds. She is having some issues here with increased cellulitis of the left lower extremity unfortunately. I did prescribe Augmentin for her eczema prescribed last week unfortunately she never actually received this prescription. The pharmacy stated they never got it. We double checked with them today they still did not receive it. I am not sure what happened in that regard. With that being said the patient is in good spirits today she does not appear to be as sick as where she normally is when the cellulitis gets significantly worse as it has in the past. Nonetheless the leg is much more red not just around the ankle where I was seeing at last week on Tuesday but rather now this is going all the way up to almost her knee and is definitely hot to touch compared to the right leg. Nonetheless I feel like she does need to go to the ER for likely IV antibiotics. 10-10-2021 upon evaluation today patient appears to be doing well with regard to her wound. She has been  tolerating the dressing changes that appears to be doing much better. After I saw her last week she did go to the ER they admitted  her to the hospital and gave her IV antibiotics she fortunately is doing significantly better. Overall I am extremely pleased with where things stand today. 10-17-2021 upon evaluation today patient appears to be doing well with regard to her wound. In fact this is looking better and her leg is looking better but she still does have some areas here of erythema. We will continue to address this as soon as possible in my opinion. I do believe she may require some debridement and what appears to be a more defined wound on her left leg at this point. 10-24-2021 upon evaluation patient is definitely showing signs of improvement which is great news. I do not see any evidence of active infection locally or systemically which is great news as well. No fevers, chills, nausea, vomiting, or diarrhea. 10-31-2021. Upon evaluation today patient's leg actually feels better from an infection and wound care perspective. I am actually very pleased in this regard. Unfortunately the biggest issue that I see at this time is that the patient is having a significant issue with her breathing. I did put her on the pulse ox machine to check her oxygen saturation and it was pretty much at 100% which is good but she tells me that when she is walking this is much more difficult for her. She also tells me that when she is trying to bend over to perform her dressing changes she is so short of breath due to the swelling and fluid in her abdominal area that she is just not been able to do this. She is tearful and very concerned today to be honest. I am truly sorry to see her this way I know she is really having a hard time at the moment. 11-14-2021 upon evaluation patient actually appears to be doing significantly better compared to when I last saw her. She was actually admitted to the hospital I believe this for 6 days total after I sent her on 10-31-2021. Subsequently they actually pulled off greater than 50 pounds of fluid she tells me  she feels significantly better her legs are not as tight she actually has some play in the tissue and it is obvious that she is doing much better just from a mobility MEDRITH, VEILLON ([id number]) [id number] Page 5 of 14 standpoint as well as a breathing standpoint. Overall I am extremely happy for her and how she is doing the leg also looks much better on the left. 11-21-2021 upon evaluation today patient appears to be doing well although it does appear that she is going require some sharp debridement the wound is very dry this is the opposite of what its been she was having so much weeping that it was staying extremely wet. Nonetheless we do need to see about going ahead and debriding the wound today. 11-28-2021 upon evaluation today patient appears to be doing well with regard to her wound I definitely see signs of things continuing to improve which is great news. I do not see any evidence of active infection locally or systemically also great news. 12-05-2021 upon evaluation today patient appears to be doing well with regard to her wound. She has been tolerating the dressing changes. Fortunately there does not appear to be any signs of active infection locally or systemically at this time. No fevers, chills, nausea, vomiting, or diarrhea. 12-12-2021 upon evaluation  patient appears to be doing well with regard to her wound this is actually measuring smaller and looking much better. Fortunately I do not see any signs of active infection locally or systemically at this time which is excellent news. 6/13; small wound area in the folds of her lymphedema just above the left ankle. Fortunately the area looks quite good. She has been using silver alginate ABDs and an Ace wrap which she is able to change her self. She is not using her compression pumps out of fear that this could contribute to heart failure. 12-26-2021 upon evaluation patient's wounds are actually showing signs of  excellent improvement. I am very pleased with where things stand. Overall I think that she has been making excellent progress and I think we are very close to complete resolution which is great news. 01-11-2022 upon evaluation today patient appears to be doing well currently in regard to her legs in general although on the left leg she does have 1 area that still irritated and inflamed on the anterior portion of her leg. Fortunately I do not see any signs of systemic infection locally there might be some infection going on here however which is my biggest concern. Fortunately I do not see any evidence though of this spreading to any other location. 01-16-2022 upon evaluation today patient has actually showing signs of excellent improvement. Fortunately I do not see any evidence of infection locally or systemically which is great news and overall I am very pleased with where we stand at this point. 01-25-2022 upon evaluation today patient actually appears to be doing decently well in regard to her legs. She has been tolerating the dressing changes without complication. Fortunately there does not appear to be any evidence of active infection locally or systemically which is great news and overall I am extremely pleased with where we stand at this point. 02-13-2022 upon evaluation today patient appears to be doing well currently in regard to her wound which is actually showing signs of significant improvement. Fortunately I do not see any evidence of active infection locally or systemically at this time. No fevers, chills, nausea, vomiting, or diarrhea. 02-20-2022 upon evaluation today patient appears to be doing well currently in regard to her wound. She has been tolerating the dressing changes without complication. Fortunately there does not appear to be any signs of active infection locally or systemically at this time. No fevers, chills, nausea, vomiting, or diarrhea. 02-27-2022 upon evaluation today patient  appears to be doing excellent in regard to her wounds on the leg. She has been tolerating the dressing changes without complication this is very close to complete resolution. Fortunately I do not see any signs of infection locally or systemically at this time which is great news. No fevers, chills, nausea, vomiting, or diarrhea. 03-06-2022 upon evaluation today patient appears to be doing well currently in regard to her wound in general. She has been tolerating the dressing changes without complication. Fortunately I do not see any evidence of active infection locally or systemically at this time which is great news. I am concerned a little bit however about not really her wounds but the second toe right foot where there appears to be some cellulitis after she dropped a frying pan on this about 2 weeks ago. She tells me its been itching and burning and I do think that it is possible she may have an infection based on what we are seeing currently. 03-20-2022 upon evaluation today patient actually appears to be  doing much better at this point. Fortunately there does not appear to be any signs of active infection locally or systemically at this time which is great news. No fevers, chills, nausea, vomiting, or diarrhea. 03-27-2022 upon evaluation today patient appears to be doing somewhat poorly in regard to her legs compared to previous. Her right leg has a couple areas that are open and she is very warm and painful to touch around the lateral portion of the ankle area. Left leg is showing signs of little bit more drainage and weeping as well I think that this is probably due to the fact that she is swelling a lot more that she has been in the past. Fortunately I do not see any signs of active infection systemically though locally there is definitely some issues here. 04-03-2022 upon evaluation today patient appears to be doing poorly in regard to her wounds on the legs unfortunately. She also seems to have  more significant cellulitis that has ensued. Unfortunately she has been on doxycycline initially it seemed to be helping but as of this week that is no longer the case. It started getting worse last week and with the reinitiation of the doxycycline nothing has improved. Often when she gets like this IV antibiotics are necessary. 04-19-2022 upon evaluation today patient appears to be doing well currently in regard to her legs from an infection standpoint. She did get IV vancomycin in the hospital and states that her legs did get better when she left she was no longer weeping but she had been in the bed for 4 days straight. With that being said since coming home her legs have begun to weep again the original wound that we were dealing with has healed she does have some weeping over both lower extremities however which is still of concern. 05-03-2022 upon evaluation today patient appears to be doing well currently in regard to her left leg which I feel like is a little bit better unfortunately the right leg is still draining significantly. Fortunately I do not see any signs of infection locally or systemically at this time. No fevers, chills, nausea, vomiting, or diarrhea. 05-10-2022 unfortunately patient actually showed signs of increasing erythema today. I believe that the cellulitis never completely resolved from when she was in the hospital last. Again she has been on doxycycline previously by myself I gave her Keflex more recently as an ongoing prescription to try to prevent infection but again I do not think this is strong enough to be able to get this under control. I really feel like that she is probably can need IV antibiotics and a good period of time in the hospital to be able to keep her legs elevated and not having to get around to do a whole lot in order to allow this to resolve. The patient voiced understanding. 05-24-2022 upon evaluation today patient appears to be doing excellent in  regard to her legs. She has been tolerating the dressing changes without complication. Fortunately there does not appear to be any signs of infection at this time which is great news she has been on cefadroxil since she was discharged from the hospital. The day that we sent her she actually tells me that she ended up in septic shock that day and coded. When she first showed up they did not notice it on the lab work but said by the end of the day she had already undergone this and they had had to address the issue obviously when  she coded. She tells me that likely she would not be here today if she had not gone to the hospital that day. I explained to her that I do believe that was a God thing and it was just the perfect timing that he had for her at that time. 06-14-2022 upon evaluation today patient appears to be doing well currently in regard to her legs. In general I think she is making good progress and I do not see any signs of infection at this time which is great news. No fevers, chills, nausea, vomiting, or diarrhea. 06-28-2022 upon evaluation today patient appears to be doing excellent in regard to her legs. She is actually showing signs of excellent improvement I do not see any evidence of infection locally nor systemically at this time. No fevers, chills, nausea, vomiting, or diarrhea. 07-26-2022 upon eval evaluation today patient appears to be doing well currently in regard to her wound. She has been tolerating the dressing changes without complication. Fortunately I do not see any major issues here she has a couple areas of weeping mainly on the right leg the left leg seems to be doing pretty Elizabeth, [name] ([id number]) [id number] Page 6 of 14 well. Electronic Signature(s) Signed: 07/26/2022 1:55:33 PM By: Worthy Keeler PA-C Entered By: Worthy Keeler on 07/26/2022  13:55:33 -------------------------------------------------------------------------------- Physical Exam Details Patient Name: Date of Service: CAILA, CIRELLI 07/26/2022 1:00 PM Medical Record Number: [id number] Patient Account Number: [id number] Date of Birth/Sex: Treating RN: May 20, 1956 (67 y.o. [place]e [place] Primary Care Provider: Ranae Plumber Other Clinician: Massie Kluver Referring Provider: Treating Provider/Extender: Skeet Simmer in Treatment: 76 Constitutional Obese and well-hydrated in no acute distress. Respiratory normal breathing without difficulty. Psychiatric this patient is able to make decisions and demonstrates good insight into disease process. Alert and Oriented x 3. pleasant and cooperative. Notes Upon inspection patient's wound bed actually showed signs of good granulation epithelization at this point there is just a little bit of weeping on the medial lateral aspect of the patient's right ankle/lower extremity region. The left leg is doing okay. Neither appears to show any signs of erythema or redness which is great news as well. No fevers, chills, nausea, vomiting, or diarrhea. Electronic Signature(s) Signed: 07/26/2022 1:56:07 PM By: Worthy Keeler PA-C Entered By: Worthy Keeler on 07/26/2022 13:56:07 -------------------------------------------------------------------------------- Physician Orders Details Patient Name: Date of Service: KIMORI, TARTAGLIA 07/26/2022 1:00 PM Medical Record Number: [id number] Patient Account Number: [id number] Date of Birth/Sex: Treating RN: 11-10-1955 (67 y.o. 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 Nurse Visit as needed Bathing/ Shower/ Hygiene Clean wound with Normal Saline or wound cleanser. Wash wounds with antibacterial soap and water. May shower; gently cleanse wound with antibacterial soap, rinse and pat dry prior to dressing wounds No tub bath. Edema Control - Lymphedema / Segmental Compressive Device / Other A wraps ce Elevate, Exercise Daily and A void Standing for Long Periods of Time. Elevate leg(s) parallel to the floor when sitting. DO YOUR BEST  to sleep in the bed at night. DO NOT sleep in your recliner. Long hours of sitting in a recliner leads to swelling of the legs and/or potential wounds on your backside. Other: - use lymphedema pumps Non-Wound Condition Bilateral Lower Extremities Cleanse affected area with antibacterial soap and water, pply appropriate compression. - use abdominal pads them ace wrap to secure A dditional non-wound orders/instructions: - silver cell to open weeping areas A Medications-Please add to medication list. ntibiotic - apply Mupirocin to weepy areas as needed Topical A Patient Medications llergies: ibuprofen, ACE Inhibitors, vancomycin A Notifications Medication Indication Start End 07/26/2022 mupirocin DOSE topical 2 % ointment - ointment topical twice a day applied to the open wounds in a thin film on the legs daily Electronic Signature(s) Signed: 07/26/2022 1:58:14 PM By: Worthy Keeler PA-C Entered By: Worthy Keeler on 07/26/2022 13:58:14 -------------------------------------------------------------------------------- Problem List Details Patient Name: Date of Service: [name]. 07/26/2022 1:00 PM Medical Record Number:  [id number] Patient Account Number: [id number] Date of Birth/Sex: Treating RN: 10/25/1955 (67 y.o. 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 [place] Primary Care Provider: Ranae Plumber Other Clinician: Massie Kluver Referring Provider: Treating Provider/Extender: Skeet Simmer in Treatment: 8 Subjective Chief Complaint Information obtained from Patient Bilateral LE lymphedema with Left LE Ulcers History of Present Illness (HPI) The patient is a 67 year old female with history of hypertension and a long-standing history of bilateral lower extremity lymphedema (first presented on 4/2) . She has had open ulcers in the past which have always responded to compression therapy. She had briefly been to a lymphedema clinic in the past which helped her at the time. this time around she stopped treatment of her lymphedema pumps approximately 2 weeks  ago because of some pain in the knees and then noticed the right leg getting worse. She was seen by her PCP who put her on clindamycin 4 times a day 2 days ago. The patient has seen [facility] and Dr. Delana Meyer had seen her last year where a vascular study including venous and arterial duplex studies were within normal limits. he had recommended compression stockings and lymphedema pumps and the patient has been using this in about 2 weeks ago. She is known to be diabetic but in the past few time she's gone to her primary care doctor her hemoglobin A1c has been normal. 02/11/2015 - after her last visit she took my advice and went to the ER regarding the progressive cellulitis of her right lower extremity and she was admitted between July 17 and 22nd. She received IV antibiotics and then was sent home on a course of steroid-induced and oral antibiotics. She has improved much since then. 02/17/2015 --  she has been doing fine and the weeping of her legs has remarkably gone down. She has no fresh issues. READMISSION 01/15/18 This patient was given this clinic before most recently in 2016 seen by Dr. Con Memos. She has massive bilateral lymphedema and over the last 2 months this had weeping edema out of the left leg. She has compression pumps but her compliance with these has been minimal. She has [facility] they've been using TCA/ABDs/kerlix under an Ace wrap.she has had recent problems with cellulitis. She was apparently seen in the ER and 12/23/17 and given clindamycin. She was then followed by her primary doctor and given doxycycline and Keflex. The pain seems to have settled down. In April 2018 the patient had arterial studies done at [facility]. This showed triphasic waveforms throughout the right leg and mostly triphasic waveforms on the left except for monophasic at the posterior tibial artery distally. She was not felt to have evidence of right lower extremity arterial stenosis or  significant problems on the left side. 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 She also had a right lower extremity venous Doppler in January 2018 this was limited by the patient's body habitus and lymphedema. Most of the proximal veins were not visualized The patient presents with an area of denuded skin on the anterior medial part of the left calf. There is weeping edema fluid here. KAEDYNCE, TAPP ([id number]) [id number] Page 9 of 14 01/22/18; the patient has somewhat better edema control using her compression pumps twice a day and as a result she has much better epithelialization on the left anterior calf area. Only a small open area remains. 01/29/18; the patient has been compliant with her compression pumps. Both the areas on her calf that healed. The remaining area on the left anterior leg is fully epithelialized Readmission: 02/20/2019 upon evaluation today patient presents for reevaluation due to issues that she is having with the bilateral lower extremities. She actually has wounds open on both legs. On the right she has an area in the crease of her leg on the right around the knee region which is actually draining quite a bit and actually has some fungal type appearance to it. She has been on nystatin powder that seems to have helped to some degree. In regard to the left lower extremity this is actually in the lower portion of her leg closer to the ankle and again is continuing to drain as well unfortunately. There does not appear to be any signs of active infection at this time which is good news. No fevers, chills, nausea, vomiting, or diarrhea. She tells me that since she was seen last year she is actually been doing quite well for the most part with regard to her lower extremities. Unfortunately she now is experiencing a little bit more drainage at this time. She is concerned about getting this under control so that it does not get  significantly worse. 02/27/2019 on evaluation today patient appears to be doing somewhat better in regard to her bilateral lower extremity wounds. She has been tolerating the dressing changes without complication. Fortunately there is no signs of active infection at this point. No fevers, chills, nausea, vomiting, or diarrhea. She did get her dressing supplies which is excellent news she was extremely excited to get these. She also got paperwork from [employer] for their financial assistance program where they may be able to help her out in the future if needed with supplies at discounted  prices. 03/06/2019 on evaluation today patient appears to be doing a little worse with regard to both areas of weeping on her bilateral lower extremities. This is around the right medial knee and just above the left ankle. With that being said she is unfortunately not doing as well as I would like to see. I feel like she may need to potentially go see someone at the lymphedema clinic as the wraps that she needs or even beyond what we can do here at the wound care center. She really does not have wounds she just has open areas of weeping that are causing some difficulty for her. Subsequently because of this and the moisture I am concerned about the potential for infection I am going to likely give her a prophylactic antibiotic today, Keflex, just to be on the safe side. Nonetheless again there is no obvious signs of active infection at this time. 03/13/2019 on evaluation today patient appears to be doing well with regard to her bilateral lower extremities where she has been weeping compared to even last week's evaluation. I see some areas of new skin growth which is excellent and overall I am very pleased with how things seem to be progressing. No fevers, chills, nausea, vomiting, or diarrhea. 03/20/2019 on evaluation today patient unfortunately is continuing to have issues with significant edema of the left lower extremity. Her  right side seems to be doing much better. Unfortunately her left side is showing increased weeping of the lower portion of her leg. This is quite unfortunate obviously we were hoping to get her into the lymphedema clinic they really do not seem to when I see her how if she is draining. Despite the fact this is really not wound related but more lymphedema weeping related. Nonetheless I do not know that this can be helpful for her to even go for that appointment since again I am not sure there is much that they would actually do at this point. We may need to try a 4 layer compression wrap as best we can on her leg. She is on the Augmentin currently although I am still concerned about whether or not there could be potentially something going on infection wise I would obtain a culture though I understand is not the best being that is a surface culture I just 1 to make sure I do not seem to be missing anything. 03/27/2019 on evaluation today patient appears to be doing much better in regard to the left lower extremity compared to last week. Last week she had tremendous weeping which I think was subsequent to infection now she seems to be doing much better and very pleased. This is not completely healed but there is a lot of new skin growth and it has dried out quite a bit. Overall I think that we are doing well with how things are moving along at this time. No fevers, chills, nausea, vomiting, or diarrhea. 04/03/2019 evaluation today patient appears to be doing a little worse this week compared to last time I saw her. I think this may be due to the fact that she is having issues with not being able to sleep in her bed at least not until last night. She is therefore been in a lift chair and subsequently has also had issues with not been able to use her pumps since she could not get in bed. With that being said the patient overall seems to be doing okay I do think I may want extend  the antibiotic for a little  bit longer at least until we can see if her edema and her weeping gets better and if it is then obviously I can always discontinue the antibiotics as of next week however I want her to continue to have it over the next week. 04/10/2019 on evaluation today patient unfortunately is still doing poorly with regard to her left lower extremity. Her right is all things considering doing fairly well. On the left however she continues to have spreading of the area of infection and weeping which appears to be even a larger surface area than noted last week. She did have a positive culture for Pseudomonas in particular which seems to have been of concern she still has green/yellow discharge consistent with Pseudomonas and subsequently a tremendous amount of it. This has me obviously still concerned about the infection not really clearing up despite the fact that on culture it appears the Cipro should have been a good option for treating this. I think she may at this point need IV antibiotics since things are not doing better I do not want to get worse and cause sepsis. She is in agreement with the plan and believes as well that she likely does need to go to the hospital for IV vancomycin. Or something of the like depending on what the recommendation is from the ER. 04/17/2019 on evaluation today patient appears to be doing excellent in regard to her lower extremity on the left. She was in the hospital for several days from when I sent her last we saw her until just this past Tuesday. Fortunately her drainage is significantly improved and in fact is mostly clear. There is just a couple small areas that may still drain a little bit she states that the [facility] they prescribed for her at discharge she went picked up from pharmacy and got home but has not been able to find it since. She is looked everywhere. She is wondering if I will replace that for her today I will be more than happy to do that. 05/01/2019 on  evaluation today patient actually appears to be doing quite well with regard to her lower extremities. She occasionally is having areas that will leak and then heal up mainly when a piece of the fibrotic skin pops off but fortunately she is not having any signs of active infection at this time. Overall she also really does not have any obvious weeping at this time. I do believe however she really needs some compression wraps and I think this may be a good time to get her back to the lymphedema clinic. 05/11/2019 on evaluation today patient actually appears to be doing quite well with regard to her bilateral lower extremities. She occasionally will have a small area that we per another but in general seems to be completely healed which is great news. Overall very pleased with how everything seems to be progressing. She does have her appointment with lymphedema clinic on November 18. 05/25/2019 on evaluation today patient appears to be doing well with regard to her left lower extremity. I am very pleased in this regard. In regard to her right leg this actually did start draining more I think it is mainly due to the fact that her leg is more swollen. I am not seeing any obvious signs of infection at this time although that is definitely something were obviously acutely aware of simply due to the fact that she had an issue not too far back with  exactly this issue. Nonetheless I do feel like that lymphedema clinic would still be beneficial for her. I explained obviously if they are not able to do anything treatment wise on the right leg we could at least have them treat her left leg and then proceed from there. The patient is really in agreement with that plan. If they are able to do both as the drainage slows down that I would be happy to let them handle both. 06/01/2019 on evaluation today patient unfortunately appears to be doing worse with regard to her right lower extremity. The left lower extremity is  still maintaining at this point. Unfortunately she has been having significantly increased pain over the past several days and has been experiencing as well increased swelling of the right lower extremity. I really do not know that I am seeing anything that appears to be obvious for infection at this point to be peripherally honest. With that being said the patient does seem to be having much more swelling that she is even experienced in the past and coupled with increased pain in her hip as well I am concerned that again she could potentially have a DVT although I am not 100% sure of this. I think it something that may need to be checked out. We discussed the possibility of sending her for a DVT study through the hospital but unfortunately transportation is an issue if she does have a DVT I do not want her to wait days to be able to get in for that test however if she has this scheduled as an outpatient that is as fast that she will be able to get the test scheduled for transportation purposes. That will also fall on Thanksgiving so subsequently she did actually be looking at either Friday or even next week before we would know anything back from this. That is much too long in my opinion. Subsequent to the amount of discomfort she is experiencing the patient is actually okay with going to the ER for evaluation today. 06/12/2019 on evaluation today patient actually appears to be doing significantly better compared to last time I saw her. Following when I last saw her she was actually in the hospital from that Monday until the following Sunday almost 1 full week. She actually was placed on Keflex in the hospital following the time for Elizabeth, [name] ([id number]) [id number] Page 10 of 14 her to be discharged and Dr. Steva Ready has recommended 2 times a day dosing of the Keflex for the next year in order to help with more prophylactic/preventative measures with regard to her  developing cellulitis. Overall I think this sounds like an excellent plan. The patient unfortunately is good to have trouble being treated at lymphedema clinic due to the fact that she really cannot get up on the bed that they have there. They also state that they cannot manage her as long as she has anything draining at this point. Obviously that is somewhat unfortunate as she does need help with edema control but nonetheless we will have to do what we can for her outside of it sounds like the lymphedema clinic scenario at this point. 06/19/2019 on evaluation today patient appears to be doing fairly well with regard to her bilateral lower extremities. She is not nearly as swollen and shows no signs of infection at this point. There is no evidence of cellulitis whatsoever. She also has no open wounds or draining at this point which is also good news. No fever  chills noted. She seems to be in very good spirits and in fact appears to be doing quite well. READMISSION 11/27/2019 This is a 67 year old woman that we have had in this clinic several times before including 2015, 16 and 19 and then most recently from 03/20/2019 through 06/19/2019 with bilateral lower extremity lymphedema. She has had previous arterial and reflux studies done years ago which were not all that remarkable. 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 She does have a positive family history and she had large legs starting may be in her 36s. She was recently in hospital from 10/20/2019 through 10/28/2019 with right leg cellulitis. She was given Ancef and clindamycin and then Zosyn when a culture showed Pseudomonas. At that time there was purulent drainage. She was followed by infectious disease Dr Steva Ready. The patient is now back at home. She has noted increased swelling in the right and no drainage in her right leg mostly on the posterior medial aspect in the calf area. She has not  had pain or fever. She has literally been improved lysing above dressings because her at the area of this is far too large for standard compression. She has been wrapping the areas with sheets to resorptive pads. She is found these helped somewhat. She does have an appointment with the lymphedema clinic in [place] in late June. Past medical history includes bilateral lymphedema, hypertension, obstructive sleep apnea with CPAP. Recent hospitalization with apparently Pseudomonas cellulitis of the right lower leg 12/15/2019 upon evaluation today patient appears to be doing a little bit worse in regard to her right lower extremity. Unfortunately she is having more weeping down in the lower portion of her leg. Fortunately there is no signs of active infection at this time. No fever chills noted. The patient states she is not having increased pain except for when she attempted to use the lymphedema pumps unfortunately she states that she did have pain when she did this. Otherwise we been using absorptive dressings of one type or another she is using diapers at home and then subsequently Ace wraps. In regard to the barrier cream we have discussed the possibility of derma cloud which she would like to try I do not have a problem with that. 12/22/2019 upon evaluation today patient actually appears to be doing better in regard to her leg ulcers at this point. Fortunately there does not appear to be any signs of active infection which is great news and I am extremely pleased with where things are progressing at this time. There is no sign of active infection currently. The patient is very pleased to see things doing so well. 12/29/2019 upon evaluation today patient appears to be doing a little bit better in regard to her weeping in general over her lower extremities. She does have some signs of mild erythema little bit more than what I noted last week or rather last visit. Nonetheless I think that my threshold  for switching her antibiotics from Keflex to something else is very low at this point considering that she has had such severe infections in the past that seem to come almost out of nowhere. There is a little erythema and warmth noted of the lower portion of her leg compared to the upper which also makes me want to go ahead and address things more rapidly at this point. Likely I would switch out the Keflex for something like Levaquin ideally. 7/16; patient with severe bilateral lymphedema.  She has superficial wounds albeit almost circumferential now on the left lateral lower leg. This may be new from last time. Small area on the right anterior lower leg and then another area on the right medial lower leg and of pannus fold. She has been using various absorptive garments. She states she is using her compression pumps once a day occasionally twice. Culture from her last visit here was negative 01/29/2020 on evaluation today patient appears to be doing excellent at this point in regard to her legs with regard to infection I see no signs of active infection at this point. She still does have unfortunately areas of weeping this is minimal on the right now her left is actually significantly worse although I do not think it is as bad as last week with Dr. Dellia Nims saw her. She has been trying to pump and elevate her legs is much as possible. She has previously been on the Keflex and in the past for prevention that seems to do fairly well and likely can extend that today. 02/04/2020 on evaluation today patient appears to be doing better in regard to her legs bilaterally. Fortunately there is no signs of active infection at this time which is great news and overall she has less weeping on the left compared to the right and there is several spots where she is pretty much sealed up with no draining regions. Overall very pleased in this regard. 02/19/2020 on evaluation today patient appears to be doing very well in  regard to her wounds currently. Fortunately there is no evidence of active infection overall very pleased with where things stand. She is significantly improved in regard to her edema I am extremely pleased in this regard she tells me that the popping no longer hurts and in fact she actually looks forward to it. 03/04/2020 on evaluation today patient appears to be doing excellent in regard to her lower extremities. Fortunately there is no signs of active infection at this time. No fevers, chills, nausea, vomiting, or diarrhea. 03/25/2020 on evaluation today patient appears to be doing a little bit more poorly in regard to her legs at this point. She tells me that she is still continue to have issues with drainage and this has been a little bit worse she was getting ready to start taking the Keflex again but wanted to see me first. Fortunately there is no signs of active infection at this time. No fevers, chills, nausea, vomiting, or diarrhea. 04/15/2020 upon evaluation today patient appears to be doing somewhat poorly in regard to her right leg. She tells me she has been having more pain she has been taking the Keflex that was previously prescribed unfortunately that just does not seem to help with this. She was hoping that the pain on her right was actually coming from the fact that she was having issues with her wrap having gotten caught in her recliner. With that being said she tells me that she knew something was not right. Currently her right leg is warm to touch along with being erythematous all the way up to around at least mid thigh as far as I can see. The left leg does not appear to be doing that badly though there is increased weeping around the ankle region. 05/06/2020 on evaluation today patient appears to be doing much better than last time I saw her. She did go to the hospital where she was admitted for 2 days and treated with antibiotic therapy. She was discharged with  antibiotics as well  and has done extremely well. I am extremely pleased with where things stand today. There is no signs of active infection at this time which is great news. 05/27/2020 upon evaluation today patient appears to be doing well with regard to her lower extremities bilaterally. She has just a very tiny area on the right leg which is opening on the left leg she is significantly improved though she still has several areas that do appear to be open this is minimal compared to what is been in the past. In general I am extremely pleased with where things stand today. The patient does tell me she is not been using her pumps quite as much as she should be. I do believe that is 1 area she can definitely work on. She has had a lot going on including a Covid exposure and apparently also a outbreak of likely shingles. 06/24/2020 upon evaluation today patient appears to be doing well with regard to her legs in general although the left leg unfortunately is showing some signs of erythema she does have a little bit of increased weeping and to be honest I am concerned about infection here. I discussed that with her today and I think that we may need to address this sooner rather than later she has been taking Keflex she is not really certain that is been making a big improvement however. No fevers, chills, nausea, vomiting, or diarrhea. 06/30/2020 upon evaluation today patient's legs actually seem to be doing better in my opinion as compared to where they were last week. Fortunately there does not appear to be any signs of active infection. Her culture showed multiple organisms nothing predominate. With that being said the Levaquin seems to have done well I think she has improved since I last saw her as well. 07/14/2020 upon evaluation today patient appears to be doing actually better in regard to her lower extremities in my opinion. She has been tolerating the dressing Elizabeth, [name] ([id number])  [id number] Page 11 of 14 changes without complication. Fortunately there is no signs of active infection at this time. 08/04/2020 upon evaluation today patient appears to be doing excellent in regard to her leg ulcers. Fortunately she has very little that is open at this point. This is great news. In fact I think that the [employer] medicated powder has been excellent for her. It seems to have done the trick where we had tried several other things without as much success. Fortunately there is no evidence of active infection at this time. No fevers, chills, nausea, vomiting, or diarrhea. 09/01/2020 upon evaluation today patient appears to be doing a little bit more poorly than the last time I saw her. She tells me right now that she has been having a lot of drainage compared to where things were previous which has unfortunately led to more irritation as well. She is concerned that this is leading to infection. Again based on what I am seeing today as well I am also concerned of the same to be honest. 09/15/2020 upon evaluation today patient appears to be doing well with regard to her legs compared to what I saw previous. Fortunately there does not appear to be any evidence of active infection at this time which is great news. At least not as badly as what it was previous. With that being said I do believe that the patient does need to have an extension of the antibiotics. This I believe will actually help her more in  the way of making sure this stays under control and does not worsen. That was the Bactrim DS. Readmission: 08/01/2021 upon evaluation today patient appears to be doing actually pretty well all things considered. Is actually been almost a year since have seen her last March. Fortunately I do not see any evidence of active infection locally nor systemically at this point. She does have a couple open areas on the left leg the right leg appears to be doing quite well. In  general she has been in the hospital 3 times since I saw her a year ago all 4 cellulitis type issues except for the last 1 which was actually more related to congestive heart failure for that reason she is not using her lymphedema pumps at this point and that is probably the best idea. 08/08/2021 upon evaluation today patient appears to be doing well with regard to her legs all things considered her left leg may be a little bit more swollen based on what I see today. Fortunately I do not see any signs of active infection locally nor systemically at this time which is great news. No fevers, chills, nausea, vomiting, or diarrhea. 08/15/2021 upon evaluation today patient actually appears to be doing excellent in regard to her wounds. She has been tolerating the dressing changes without complication. Fortunately I see no evidence of infection currently which is great news. No fevers, chills, nausea, vomiting, or diarrhea. 08/29/2021 upon evaluation patient appears to be doing excellent she in fact is almost completely healed. I am actually very pleased with where we stand today and I think that she is making wonderful progress she just has a small area of weeping on the right lateral leg everything else is pretty much completely healed which is also. 09/05/2021 upon evaluation today patient appears to be doing well with regard to her legs. She does have a small area of weeping on the right leg and there is a small area on the left leg as well. It is potentially something that may need to be addressed in the future more specifically but right now I think that there may have just been a little drainage here on the left. With all that being said I think that this still does not appear to be infected and overall I feel like she is doing quite well. That something we always have to keep a very close eye on as she can change very rapidly from okay to not okay. 09/12/2021 upon evaluation today patient appears to be  doing a little bit worse in regard to her leg and actually somewhat concerned about the possibility of infection here. I discussed that with the patient. For that reason I am going to go ahead and see about getting her set up for a repeat prescription for the Bactrim. She is in agreement with that plan. 3/14; patient presents for follow-up. She has been using silver alginate with dressing changes. She is still taking Bactrim prescribed at last clinic visit. She has no issues or complaints today. She has lymphedema pumps however does not use these. 09/26/2020 upon evaluation today patient appears to actually be doing well in regard to her right leg I am pleased in that regard. Unfortunately she has new areas on her left leg that are open at this point that I do need to be addressed. I am also concerned about the warmth around one of the wounds on the more anterior side. I think this is something that we may need to  culture today potentially requiring antibiotics going forward. 10/03/2021 upon evaluation today patient unfortunately is not doing nearly as well as she was even last week with her wounds. She is having some issues here with increased cellulitis of the left lower extremity unfortunately. I did prescribe Augmentin for her eczema prescribed last week unfortunately she never actually received this prescription. The pharmacy stated they never got it. We double checked with them today they still did not receive it. I am not sure what happened in that regard. With that being said the patient is in good spirits today she does not appear to be as sick as where she normally is when the cellulitis gets significantly worse as it has in the past. Nonetheless the leg is much more red not just around the ankle where I was seeing at last week on Tuesday but rather now this is going all the way up to almost her knee and is definitely hot to touch compared to the right leg. Nonetheless I feel like she does need  to go to the ER for likely IV antibiotics. 10-10-2021 upon evaluation today patient appears to be doing well with regard to her wound. She has been tolerating the dressing changes that appears to be doing much better. After I saw her last week she did go to the ER they admitted her to the hospital and gave her IV antibiotics she fortunately is doing significantly better. Overall I am extremely pleased with where things stand today. 10-17-2021 upon evaluation today patient appears to be doing well with regard to her wound. In fact this is looking better and her leg is looking better but she still does have some areas here of erythema. We will continue to address this as soon as possible in my opinion. I do believe she may require some debridement and what appears to be a more defined wound on her left leg at this point. 10-24-2021 upon evaluation patient is definitely showing signs of improvement which is great news. I do not see any evidence of active infection locally or systemically which is great news as well. No fevers, chills, nausea, vomiting, or diarrhea. 10-31-2021. Upon evaluation today patient's leg actually feels better from an infection and wound care perspective. I am actually very pleased in this regard. Unfortunately the biggest issue that I see at this time is that the patient is having a significant issue with her breathing. I did put her on the pulse ox machine to check her oxygen saturation and it was pretty much at 100% which is good but she tells me that when she is walking this is much more difficult for her. She also tells me that when she is trying to bend over to perform her dressing changes she is so short of breath due to the swelling and fluid in her abdominal area that she is just not been able to do this. She is tearful and very concerned today to be honest. I am truly sorry to see her this way I know she is really having a hard time at the moment. 11-14-2021 upon evaluation  patient actually appears to be doing significantly better compared to when I last saw her. She was actually admitted to the hospital I believe this for 6 days total after I sent her on 10-31-2021. Subsequently they actually pulled off greater than 50 pounds of fluid she tells me she feels significantly better her legs are not as tight she actually has some play in the tissue and  it is obvious that she is doing much better just from a mobility standpoint as well as a breathing standpoint. Overall I am extremely happy for her and how she is doing the leg also looks much better on the left. 11-21-2021 upon evaluation today patient appears to be doing well although it does appear that she is going require some sharp debridement the wound is very dry this is the opposite of what its been she was having so much weeping that it was staying extremely wet. Nonetheless we do need to see about going ahead and debriding the wound today. 11-28-2021 upon evaluation today patient appears to be doing well with regard to her wound I definitely see signs of things continuing to improve which is great news. I do not see any evidence of active infection locally or systemically also great news. 12-05-2021 upon evaluation today patient appears to be doing well with regard to her wound. She has been tolerating the dressing changes. Fortunately there does not appear to be any signs of active infection locally or systemically at this time. No fevers, chills, nausea, vomiting, or diarrhea. 12-12-2021 upon evaluation patient appears to be doing well with regard to her wound this is actually measuring smaller and looking much better. Fortunately I do Elizabeth, [name] ([id number]) [id number] Page 12 of 14 not see any signs of active infection locally or systemically at this time which is excellent news. 6/13; small wound area in the folds of her lymphedema just above the left ankle. Fortunately the area looks  quite good. She has been using silver alginate ABDs and an Ace wrap which she is able to change her self. She is not using her compression pumps out of fear that this could contribute to heart failure. 12-26-2021 upon evaluation patient's wounds are actually showing signs of excellent improvement. I am very pleased with where things stand. Overall I think that she has been making excellent progress and I think we are very close to complete resolution which is great news. 01-11-2022 upon evaluation today patient appears to be doing well currently in regard to her legs in general although on the left leg she does have 1 area that still irritated and inflamed on the anterior portion of her leg. Fortunately I do not see any signs of systemic infection locally there might be some infection going on here however which is my biggest concern. Fortunately I do not see any evidence though of this spreading to any other location. 01-16-2022 upon evaluation today patient has actually showing signs of excellent improvement. Fortunately I do not see any evidence of infection locally or systemically which is great news and overall I am very pleased with where we stand at this point. 01-25-2022 upon evaluation today patient actually appears to be doing decently well in regard to her legs. She has been tolerating the dressing changes without complication. Fortunately there does not appear to be any evidence of active infection locally or systemically which is great news and overall I am extremely pleased with where we stand at this point. 02-13-2022 upon evaluation today patient appears to be doing well currently in regard to her wound which is actually showing signs of significant improvement. Fortunately I do not see any evidence of active infection locally or systemically at this time. No fevers, chills, nausea, vomiting, or diarrhea. 02-20-2022 upon evaluation today patient appears to be doing well currently in regard to  her wound. She has been tolerating the dressing changes without complication. Fortunately there  does not appear to be any signs of active infection locally or systemically at this time. No fevers, chills, nausea, vomiting, or diarrhea. 02-27-2022 upon evaluation today patient appears to be doing excellent in regard to her wounds on the leg. She has been tolerating the dressing changes without complication this is very close to complete resolution. Fortunately I do not see any signs of infection locally or systemically at this time which is great news. No fevers, chills, nausea, vomiting, or diarrhea. 03-06-2022 upon evaluation today patient appears to be doing well currently in regard to her wound in general. She has been tolerating the dressing changes without complication. Fortunately I do not see any evidence of active infection locally or systemically at this time which is great news. I am concerned a little bit however about not really her wounds but the second toe right foot where there appears to be some cellulitis after she dropped a frying pan on this about 2 weeks ago. She tells me its been itching and burning and I do think that it is possible she may have an infection based on what we are seeing currently. 03-20-2022 upon evaluation today patient actually appears to be doing much better at this point. Fortunately there does not appear to be any signs of active infection locally or systemically at this time which is great news. No fevers, chills, nausea, vomiting, or diarrhea. 03-27-2022 upon evaluation today patient appears to be doing somewhat poorly in regard to her legs compared to previous. Her right leg has a couple areas that are open and she is very warm and painful to touch around the lateral portion of the ankle area. Left leg is showing signs of little bit more drainage and weeping as well I think that this is probably due to the fact that she is swelling a lot more that she has been  in the past. Fortunately I do not see any signs of active infection systemically though locally there is definitely some issues here. 04-03-2022 upon evaluation today patient appears to be doing poorly in regard to her wounds on the legs unfortunately. She also seems to have more significant cellulitis that has ensued. Unfortunately she has been on doxycycline initially it seemed to be helping but as of this week that is no longer the case. It started getting worse last week and with the reinitiation of the doxycycline nothing has improved. Often when she gets like this IV antibiotics are necessary. 04-19-2022 upon evaluation today patient appears to be doing well currently in regard to her legs from an infection standpoint. She did get IV vancomycin in the hospital and states that her legs did get better when she left she was no longer weeping but she had been in the bed for 4 days straight. With that being said since coming home her legs have begun to weep again the original wound that we were dealing with has healed she does have some weeping over both lower extremities however which is still of concern. 05-03-2022 upon evaluation today patient appears to be doing well currently in regard to her left leg which I feel like is a little bit better unfortunately the right leg is still draining significantly. Fortunately I do not see any signs of infection locally or systemically at this time. No fevers, chills, nausea, vomiting, or diarrhea. 05-10-2022 unfortunately patient actually showed signs of increasing erythema today. I believe that the cellulitis never completely resolved from when she was in the hospital last. Again she  has been on doxycycline previously by myself I gave her Keflex more recently as an ongoing prescription to try to prevent infection but again I do not think this is strong enough to be able to get this under control. I really feel like that she is probably can need IV antibiotics  and a good period of time in the hospital to be able to keep her legs elevated and not having to get around to do a whole lot in order to allow this to resolve. The patient voiced understanding. 05-24-2022 upon evaluation today patient appears to be doing excellent in regard to her legs. She has been tolerating the dressing changes without complication. Fortunately there does not appear to be any signs of infection at this time which is great news she has been on cefadroxil since she was discharged from the hospital. The day that we sent her she actually tells me that she ended up in septic shock that day and coded. When she first showed up they did not notice it on the lab work but said by the end of the day she had already undergone this and they had had to address the issue obviously when she coded. She tells me that likely she would not be here today if she had not gone to the hospital that day. I explained to her that I do believe that was a God thing and it was just the perfect timing that he had for her at that time. 06-14-2022 upon evaluation today patient appears to be doing well currently in regard to her legs. In general I think she is making good progress and I do not see any signs of infection at this time which is great news. No fevers, chills, nausea, vomiting, or diarrhea. 06-28-2022 upon evaluation today patient appears to be doing excellent in regard to her legs. She is actually showing signs of excellent improvement I do not see any evidence of infection locally nor systemically at this time. No fevers, chills, nausea, vomiting, or diarrhea. 07-26-2022 upon eval evaluation today patient appears to be doing well currently in regard to her wound. She has been tolerating the dressing changes without complication. Fortunately I do not see any major issues here she has a couple areas of weeping mainly on the right leg the left leg seems to be doing pretty well. Objective Elizabeth, [name]  ([id number]) [id number] Page 13 of 14 Constitutional Obese and well-hydrated in no acute distress. Vitals Time Taken: 1:04 PM, Height: 63 in, Weight: 372 lbs, BMI: 65.9, Temperature: 98.3 F, Pulse: 87 bpm, Respiratory Rate: 18 breaths/min, Blood Pressure: 118/76 mmHg. Respiratory normal breathing without difficulty. Psychiatric this patient is able to make decisions and demonstrates good insight into disease process. Alert and Oriented x 3. pleasant and cooperative. General Notes: Upon inspection patient's wound bed actually showed signs of good granulation epithelization at this point there is just a little bit of weeping on the medial lateral aspect of the patient's right ankle/lower extremity region. The left leg is doing okay. Neither appears to show any signs of erythema or redness which is great news as well. No fevers, chills, nausea, vomiting, or diarrhea. Other Condition(s) Patient presents with Lymphedema located on the Right Leg. General Notes: swelling down, moderate drainage noted to right medial and lateral lower leg, pain 1/10 during visit Patient presents with Lymphedema located on the Left Leg. 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 Nurse Visit as needed Bathing/ Shower/ Hygiene: Clean wound with Normal Saline or wound cleanser. Wash wounds with antibacterial soap and water. May shower; gently cleanse wound with antibacterial soap, rinse and pat dry prior to dressing wounds No tub bath. Edema Control - Lymphedema / Segmental Compressive Device / Other: Ace wraps Elevate, Exercise Daily and Avoid  Standing for Long Periods of Time. Elevate leg(s) parallel to the floor when sitting. DO YOUR BEST to sleep in the bed at night. DO NOT sleep in your recliner. Long hours of sitting in a recliner leads to swelling of the legs and/or potential wounds on your backside. Other: - use lymphedema pumps Non-Wound Condition: Cleanse affected area with antibacterial soap and water, Apply appropriate compression. - use abdominal pads them ace wrap to secure Additional non-wound orders/instructions: - silver cell to open weeping areas Medications-Please add to medication list.: Topical Antibiotic - apply Mupirocin to weepy areas as needed The following medication(s) was prescribed: mupirocin topical 2 % ointment ointment topical twice a day applied to the open wounds in a thin film on the legs daily starting 07/26/2022 1. Based on what I am seeing I do believe that the patient would benefit from a continuation of therapy using the absorptive pads along with the Ace wrap to secure in place. She seems to do decently well with this. 2. I am also can recommend currently that we have the patient continue to monitor for any signs of infection. Obviously right now I do not see any evidence of infection Keep close eye on this going forward. 3. I am also can recommend the patient should go ahead and have a prescription for mupirocin if she starts to develop any open wounds that required treatment she can go ahead and begin the mupirocin at that time and then contact the office and let me know as soon as possible. She voiced understanding We will see patient back for reevaluation in 4 weeks here in the clinic. If anything worsens or changes patient will contact our office for additional recommendations. Electronic Signature(s) Signed: 07/26/2022 1:58:27 PM By: Worthy Keeler PA-C Entered By: Worthy Keeler on 07/26/2022 13:58:26 [name] ([id number]) [id number] Page 14 of  14 -------------------------------------------------------------------------------- SuperBill Details Patient Name: Date of Service: Elizabeth, [name] 07/26/2022 Medical Record Number: [id number] Patient Account Number: [id number] Date of Birth/Sex: Treating RN: [birth year] (67 y.o. Charolette Forward, Kim Primary Care Provider: Ranae Plumber Other Clinician: Massie Kluver Referring Provider: Treating Provider/Extender: Skeet Simmer in Treatment: 51 Diagnosis Coding ICD-10 Codes Code Description Q82.0 Hereditary lymphedema L97.811 Non-pressure chronic ulcer of other part of right lower leg limited to breakdown of skin L97.821 Non-pressure chronic ulcer of other part of left lower leg limited to breakdown of skin Facility Procedures : CPT4 Code: [id number] Description: [phone] - WOUND CARE VISIT-LEV 2 EST PT Modifier: Quantity: 1 Physician Procedures : CPT4 Code Description Modifier [id number] - WC PHYS LEVEL 3 - EST PT ICD-10 Diagnosis Description Q82.0 Hereditary lymphedema L97.811 Non-pressure chronic ulcer of other part of right lower leg limited to breakdown of skin L97.821 Non-pressure  chronic ulcer of other part of left lower leg limited to breakdown of skin Quantity: 1 Electronic Signature(s) Signed: 07/26/2022 1:58:49 PM By: Melburn Hake,  Alante Weimann PA-C Entered By: Worthy Keeler on 07/26/2022 13:58:48

## 2022-08-03 ENCOUNTER — Encounter: Payer: Self-pay | Admitting: Family

## 2022-08-03 ENCOUNTER — Encounter
Admission: RE | Admit: 2022-08-03 | Discharge: 2022-08-03 | Disposition: A | Discharge status: Home / Self Care | Payer: Medicare HMO | Source: Ambulatory Visit | Attending: Family | Admitting: Family

## 2022-08-03 DIAGNOSIS — I5032 Chronic diastolic (congestive) heart failure: Secondary | ICD-10-CM | POA: Insufficient documentation

## 2022-08-03 DIAGNOSIS — Z01812 Encounter for preprocedural laboratory examination: Secondary | ICD-10-CM | POA: Insufficient documentation

## 2022-08-03 LAB — BASIC METABOLIC PANEL
Anion gap: 11 (ref 5–15)
BUN: 35 mg/dL — ABNORMAL HIGH (ref 8–23)
CO2: 27 mmol/L (ref 22–32)
Calcium: 9.4 mg/dL (ref 8.9–10.3)
Chloride: 99 mmol/L (ref 98–111)
Creatinine, Ser: 1.28 mg/dL — ABNORMAL HIGH (ref 0.44–1.00)
GFR, Estimated: 46 mL/min — ABNORMAL LOW (ref >60)
Glucose, Bld: 98 mg/dL (ref 70–99)
Potassium: 3.8 mmol/L (ref 3.5–5.1)
Sodium: 137 mmol/L (ref 135–145)

## 2022-08-22 NOTE — Progress Notes (Deleted)
Patient ID: [name], female    DOB: Aug 26, 1955, 67 y.o.   MRN: [id number]  HPI  [name] is a 67 y/o female with a history of asthma, HTN, arthritis, lymphedema, sleep apnea, spinal stenosis, obesity, previous tobacco use and chronic heart failure.   Echo report from 11/01/21 reviewed and showed an EF of 60-65%.   Admitted 05/10/22 due to worsening edema and drainage. Patient evaluated and found to have cellulitis of the right lower extremity. Had AKI. Vascular and wound consults obtained. Antibiotics started. Became symptomatic with elevated lactic acid and WBC. IVF given and antibiotic changed. CT scan 11/7 did not show any drainable fluid collection. Discharged after 6 days. Admitted 04/03/22 due to right lower leg cellulitis that had failed out patient therapy. Initially given IV antibiotics and then changed to oral meds. She had hypokalemia and hypomagnesemia that were repleted. Discharged after 4 days.   She presents today for a follow-up visit with a chief complaint of   Past Medical History:  Diagnosis Date   Arthritis    Asthma    CHF (congestive heart failure) ([place])    Enlarged heart    Hypertension    Lymphedema    Sleep apnea    Spinal stenosis    Past Surgical History:  Procedure Laterality Date   CENTRAL LINE INSERTION N/A 05/11/2022   Procedure: CENTRAL LINE INSERTION;  Surgeon: Katha Cabal, MD;  Location: [facility];  Service: Cardiovascular;  Laterality: N/A;   COLONOSCOPY WITH PROPOFOL N/A 03/12/2018   Procedure: COLONOSCOPY WITH PROPOFOL;  Surgeon: Manya Silvas, MD;  Location: [facility];  Service: Endoscopy;  Laterality: N/A;   NO PAST SURGERIES     Family History  Problem Relation Age of Onset   Breast cancer Maternal Aunt    Thyroid disease Other    Allergies  Allergen Reactions   Vancomycin Other (See Comments)    Red man syndrome   Ace Inhibitors Hives and Swelling   Ibuprofen Hives and Swelling    Review of  Systems  Constitutional:  Positive for fatigue. Negative for appetite change.  HENT:  Positive for congestion. Negative for postnasal drip and sore throat.   Eyes: Negative.   Respiratory:  Positive for cough. Negative for shortness of breath and wheezing.   Cardiovascular:  Positive for palpitations and leg swelling. Negative for chest pain.  Gastrointestinal:  Negative for abdominal distention, abdominal pain and constipation.  Endocrine: Negative.   Genitourinary: Negative.   Musculoskeletal:  Positive for arthralgias (right foot) and back pain (when laying in the bed too long).  Skin: Negative.        Severe lymphedema  Allergic/Immunologic: Negative.   Neurological:  Positive for dizziness (at times). Negative for light-headedness.  Hematological:  Negative for adenopathy. Does not bruise/bleed easily.  Psychiatric/Behavioral:  Negative for dysphoric mood and sleep disturbance (sleeping well in new lift chair). The patient is not nervous/anxious.      Physical Exam Vitals and nursing note reviewed.  Constitutional:      Appearance: She is well-developed.  HENT:     Head: Normocephalic and atraumatic.  Cardiovascular:     Rate and Rhythm: Normal rate and regular rhythm.  Pulmonary:     Effort: Pulmonary effort is normal. No accessory muscle usage.     Breath sounds: No wheezing, rhonchi or rales.  Abdominal:     Palpations: Abdomen is soft.     Tenderness: There is no abdominal tenderness.  Musculoskeletal:  Cervical back: Normal range of motion and neck supple.     Right lower leg: Edema present.     Left lower leg: Edema present.     Comments: Severe lymphedema  Skin:    General: Skin is warm and dry.  Neurological:     General: No focal deficit present.     Mental Status: She is alert and oriented to person, place, and time.  Psychiatric:        Mood and Affect: Mood is not depressed.        Behavior: Behavior normal.   Assessment & Plan:  1: Chronic heart  failure with preserved ejection fraction without structural changes- - NYHA class III - euvolemic today - has attempted  to weigh daily with standing on 2 scales but it fluctuates too much - weight down 9 pounds from last visit here 1 month ago - limiting her sodium intake but occasionally will eat saltier foods  - understands to keep daily fluid intake to 60-64 ounces/ day - facial/mouth swelling with ACEi - farxiga 53m  - spironolactone 12.588mQD - BMP today - saw cardiology (DMargarito Courser9/11/23 - BNP 10/31/21 was 123.9 - has received flu vaccine for this season - PharmD reconciled medications with the patient  2: HTN with stage 3 CKD- - BP  - [email or web address]- saw nephrology (LHolley Raring10/30/23  - continues to take metolazone twice weekly and occasionally doesn't take her bumex just to give her kidneys a break - BMP 06/20/22 reviewed and showed sodium 139, potassium 3.4, creatinine 1.18 and GFR 51  3: Lymphedema- - saw wound center 06/28/22 - both lower legs currently wrapped  4: Sleep apnea- - wearing CPAP nightly   Medication list reviewed.  Return in 1 month, sooner if needed.

## 2022-08-23 ENCOUNTER — Encounter: Payer: Medicare HMO | Attending: Physician Assistant | Admitting: Physician Assistant

## 2022-08-23 ENCOUNTER — Encounter: Payer: Medicare HMO | Admitting: Family

## 2022-08-23 DIAGNOSIS — L97811 Non-pressure chronic ulcer of other part of right lower leg limited to breakdown of skin: Secondary | ICD-10-CM | POA: Diagnosis present

## 2022-08-23 DIAGNOSIS — L97821 Non-pressure chronic ulcer of other part of left lower leg limited to breakdown of skin: Secondary | ICD-10-CM | POA: Insufficient documentation

## 2022-08-23 DIAGNOSIS — Q820 Hereditary lymphedema: Secondary | ICD-10-CM | POA: Diagnosis not present

## 2022-08-24 NOTE — Progress Notes (Signed)
TERRELLE, ECKELBARGER ([id number]) [id number] Page 1 of 14 Visit Report for 08/23/2022 Chief Complaint Document Details Patient Name: Date of Service: [name], [name] 08/23/2022 10:00 A M Medical Record Number: [id number] Patient Account Number: [id number] Date of Birth/Sex: Treating RN: 06-01-1956 (67 y.o. [place] Primary Care Provider: Ranae Plumber Other Clinician: Massie Kluver Referring Provider: Treating Provider/Extender: Skeet Simmer in Treatment: 14 Information Obtained from: Patient Chief Complaint Bilateral LE lymphedema with Left LE Ulcers Electronic Signature(s) Signed: 08/23/2022 10:00:15 AM By: Worthy Keeler PA-C Entered By: Worthy Keeler on 08/23/2022 10:00:15 -------------------------------------------------------------------------------- HPI Details Patient Name: Date of Service: [name], [name] 08/23/2022 10:00 A M Medical Record Number: [id number] Patient Account Number: [id number] Date of Birth/Sex: Treating RN: 07-14-1955 (67 y.o. [place] Primary Care Provider: Ranae Plumber Other Clinician: Massie Kluver Referring Provider: Treating Provider/Extender: Skeet Simmer in Treatment: 59 History of Present Illness HPI Description: The patient is a 67 year old female with history of hypertension and a long-standing history of bilateral lower extremity lymphedema (first presented on 4/2) . She has had open ulcers in the past which have always responded to compression therapy. She had briefly been to a lymphedema clinic in the past which helped her at the time. this time around she stopped treatment of her lymphedema pumps approximately 2 weeks ago because of some pain in the knees and then noticed the right leg getting worse. She was seen by her PCP who put her on clindamycin 4 times a day 2 days ago. The patient has seen [facility] and Dr. Delana Meyer had seen her last year where a vascular  study including venous and arterial duplex studies were within normal limits. he had recommended compression stockings and lymphedema pumps and the patient has been using this in about 2 weeks ago. She is known to be diabetic but in the past few time she's gone to her primary care doctor her hemoglobin A1c has been normal. 02/11/2015 - after her last visit she took my advice and went to the ER regarding the progressive cellulitis of her right lower extremity and she was admitted between July 17 and 22nd. She received IV antibiotics and then was sent home on a course of steroid-induced and oral antibiotics. She has improved much since then. 02/17/2015 -- she has been doing fine and the weeping of her legs has remarkably gone down. She has no fresh issues. JOSEFINE, [name] ([id number]) [id number] Page 2 of 14 01/15/18 This patient was given this clinic before most recently in 2016 seen by Dr. Con Memos. She has massive bilateral lymphedema and over the last 2 months this had weeping edema out of the left leg. She has compression pumps but her compliance with these has been minimal. She has [facility] they've been using TCA/ABDs/kerlix under an Ace wrap.she has had recent problems with cellulitis. She was apparently seen in the ER and 12/23/17 and given clindamycin. She was then followed by her primary doctor and given doxycycline and Keflex. The pain seems to have settled down. In April 2018 the patient had arterial studies done at [facility]. This showed triphasic waveforms throughout the right leg and mostly triphasic waveforms on the left except for monophasic at the posterior tibial artery distally. She was not felt to have evidence of right lower extremity arterial stenosis or significant problems on the left side. 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 She also had a right  lower extremity venous Doppler in  January 2018 this was limited by the patient's body habitus and lymphedema. Most of the proximal veins were not visualized The patient presents with an area of denuded skin on the anterior medial part of the left calf. There is weeping edema fluid here. 01/22/18; the patient has somewhat better edema control using her compression pumps twice a day and as a result she has much better epithelialization on the left anterior calf area. Only a small open area remains. 01/29/18; the patient has been compliant with her compression pumps. Both the areas on her calf that healed. The remaining area on the left anterior leg is fully epithelialized Readmission: 02/20/2019 upon evaluation today patient presents for reevaluation due to issues that she is having with the bilateral lower extremities. She actually has wounds open on both legs. On the right she has an area in the crease of her leg on the right around the knee region which is actually draining quite a bit and actually has some fungal type appearance to it. She has been on nystatin powder that seems to have helped to some degree. In regard to the left lower extremity this is actually in the lower portion of her leg closer to the ankle and again is continuing to drain as well unfortunately. There does not appear to be any signs of active infection at this time which is good news. No fevers, chills, nausea, vomiting, or diarrhea. She tells me that since she was seen last year she is actually been doing quite well for the most part with regard to her lower extremities. Unfortunately she now is experiencing a little bit more drainage at this time. She is concerned about getting this under control so that it does not get significantly worse. 02/27/2019 on evaluation today patient appears to be doing somewhat better in regard to her bilateral lower extremity wounds. She has been tolerating the dressing changes without complication. Fortunately there is no signs of  active infection at this point. No fevers, chills, nausea, vomiting, or diarrhea. She did get her dressing supplies which is excellent news she was extremely excited to get these. She also got paperwork from [employer] for their financial assistance program where they may be able to help her out in the future if needed with supplies at discounted prices. 03/06/2019 on evaluation today patient appears to be doing a little worse with regard to both areas of weeping on her bilateral lower extremities. This is around the right medial knee and just above the left ankle. With that being said she is unfortunately not doing as well as I would like to see. I feel like she may need to potentially go see someone at the lymphedema clinic as the wraps that she needs or even beyond what we can do here at the wound care center. She really does not have wounds she just has open areas of weeping that are causing some difficulty for her. Subsequently because of this and the moisture I am concerned about the potential for infection I am going to likely give her a prophylactic antibiotic today, Keflex, just to be on the safe side. Nonetheless again there is no obvious signs of active infection at this time. 03/13/2019 on evaluation today patient appears to be doing well with regard to her bilateral lower extremities where she has been weeping compared to even last week's evaluation. I see some areas of new skin growth which is excellent and overall I am very pleased with  how things seem to be progressing. No fevers, chills, nausea, vomiting, or diarrhea. 03/20/2019 on evaluation today patient unfortunately is continuing to have issues with significant edema of the left lower extremity. Her right side seems to be doing much better. Unfortunately her left side is showing increased weeping of the lower portion of her leg. This is quite unfortunate obviously we were hoping to get her into the lymphedema clinic they really do not seem  to when I see her how if she is draining. Despite the fact this is really not wound related but more lymphedema weeping related. Nonetheless I do not know that this can be helpful for her to even go for that appointment since again I am not sure there is much that they would actually do at this point. We may need to try a 4 layer compression wrap as best we can on her leg. She is on the Augmentin currently although I am still concerned about whether or not there could be potentially something going on infection wise I would obtain a culture though I understand is not the best being that is a surface culture I just 1 to make sure I do not seem to be missing anything. 03/27/2019 on evaluation today patient appears to be doing much better in regard to the left lower extremity compared to last week. Last week she had tremendous weeping which I think was subsequent to infection now she seems to be doing much better and very pleased. This is not completely healed but there is a lot of new skin growth and it has dried out quite a bit. Overall I think that we are doing well with how things are moving along at this time. No fevers, chills, nausea, vomiting, or diarrhea. 04/03/2019 evaluation today patient appears to be doing a little worse this week compared to last time I saw her. I think this may be due to the fact that she is having issues with not being able to sleep in her bed at least not until last night. She is therefore been in a lift chair and subsequently has also had issues with not been able to use her pumps since she could not get in bed. With that being said the patient overall seems to be doing okay I do think I may want extend the antibiotic for a little bit longer at least until we can see if her edema and her weeping gets better and if it is then obviously I can always discontinue the antibiotics as of next week however I want her to continue to have it over the next week. 04/10/2019 on  evaluation today patient unfortunately is still doing poorly with regard to her left lower extremity. Her right is all things considering doing fairly well. On the left however she continues to have spreading of the area of infection and weeping which appears to be even a larger surface area than noted last week. She did have a positive culture for Pseudomonas in particular which seems to have been of concern she still has green/yellow discharge consistent with Pseudomonas and subsequently a tremendous amount of it. This has me obviously still concerned about the infection not really clearing up despite the fact that on culture it appears the Cipro should have been a good option for treating this. I think she may at this point need IV antibiotics since things are not doing better I do not want to get worse and cause sepsis. She is in agreement with the  plan and believes as well that she likely does need to go to the hospital for IV vancomycin. Or something of the like depending on what the recommendation is from the ER. 04/17/2019 on evaluation today patient appears to be doing excellent in regard to her lower extremity on the left. She was in the hospital for several days from when I sent her last we saw her until just this past Tuesday. Fortunately her drainage is significantly improved and in fact is mostly clear. There is just a couple small areas that may still drain a little bit she states that the [facility] they prescribed for her at discharge she went picked up from pharmacy and got home but has not been able to find it since. She is looked everywhere. She is wondering if I will replace that for her today I will be more than happy to do that. 05/01/2019 on evaluation today patient actually appears to be doing quite well with regard to her lower extremities. She occasionally is having areas that will leak and then heal up mainly when a piece of the fibrotic skin pops off but fortunately she is not  having any signs of active infection at this time. Overall she also really does not have any obvious weeping at this time. I do believe however she really needs some compression wraps and I think this may be a good time to get her back to the lymphedema clinic. 05/11/2019 on evaluation today patient actually appears to be doing quite well with regard to her bilateral lower extremities. She occasionally will have a small area that we per another but in general seems to be completely healed which is great news. Overall very pleased with how everything seems to be progressing. She does have her appointment with lymphedema clinic on November 18. 05/25/2019 on evaluation today patient appears to be doing well with regard to her left lower extremity. I am very pleased in this regard. In regard to her right leg this actually did start draining more I think it is mainly due to the fact that her leg is more swollen. I am not seeing any obvious signs of infection at this time although that is definitely something were obviously acutely aware of simply due to the fact that she had an issue not too far back with exactly this issue. Nonetheless I do feel like that lymphedema clinic would still be beneficial for her. I explained obviously if they are not able to do anything treatment wise on the right leg we could at least have them treat her left leg and then proceed from there. The patient is really in agreement with that plan. If they are able to do both as the drainage slows down that I would be happy to let them handle both. JAINABA, DIGANGI ([id number]) [id number] Page 3 of 14 06/01/2019 on evaluation today patient unfortunately appears to be doing worse with regard to her right lower extremity. The left lower extremity is still maintaining at this point. Unfortunately she has been having significantly increased pain over the past several days and has been experiencing as  well increased swelling of the right lower extremity. I really do not know that I am seeing anything that appears to be obvious for infection at this point to be peripherally honest. With that being said the patient does seem to be having much more swelling that she is even experienced in the past and coupled with increased pain in her hip as well I am  concerned that again she could potentially have a DVT although I am not 100% sure of this. I think it something that may need to be checked out. We discussed the possibility of sending her for a DVT study through the hospital but unfortunately transportation is an issue if she does have a DVT I do not want her to wait days to be able to get in for that test however if she has this scheduled as an outpatient that is as fast that she will be able to get the test scheduled for transportation purposes. That will also fall on Thanksgiving so subsequently she did actually be looking at either Friday or even next week before we would know anything back from this. That is much too long in my opinion. Subsequent to the amount of discomfort she is experiencing the patient is actually okay with going to the ER for evaluation today. 06/12/2019 on evaluation today patient actually appears to be doing significantly better compared to last time I saw her. Following when I last saw her she was actually in the hospital from that Monday until the following Sunday almost 1 full week. She actually was placed on Keflex in the hospital following the time for her to be discharged and Dr. Steva Ready has recommended 2 times a day dosing of the Keflex for the next year in order to help with more prophylactic/preventative measures with regard to her developing cellulitis. Overall I think this sounds like an excellent plan. The patient unfortunately is good to have trouble being treated at lymphedema clinic due to the fact that she really cannot get up on the bed that they have  there. They also state that they cannot manage her as long as she has anything draining at this point. Obviously that is somewhat unfortunate as she does need help with edema control but nonetheless we will have to do what we can for her outside of it sounds like the lymphedema clinic scenario at this point. 06/19/2019 on evaluation today patient appears to be doing fairly well with regard to her bilateral lower extremities. She is not nearly as swollen and shows no signs of infection at this point. There is no evidence of cellulitis whatsoever. She also has no open wounds or draining at this point which is also good news. No fever chills noted. She seems to be in very good spirits and in fact appears to be doing quite well. READMISSION 11/27/2019 This is a 67 year old woman that we have had in this clinic several times before including 2015, 16 and 19 and then most recently from 03/20/2019 through 06/19/2019 with bilateral lower extremity lymphedema. She has had previous arterial and reflux studies done years ago which were not all that remarkable. 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 She does have a positive family history and she had large legs starting may be in her 62s. She was recently in hospital from 10/20/2019 through 10/28/2019 with right leg cellulitis. She was given Ancef and clindamycin and then Zosyn when a culture showed Pseudomonas. At that time there was purulent drainage. She was followed by infectious disease Dr Steva Ready. The patient is now back at home. She has noted increased swelling in the right and no drainage in her right leg mostly on the posterior medial aspect in the calf area. She has not had pain or fever. She has literally been improved lysing above dressings because her at the area of this is far  too large for standard compression. She has been wrapping the areas with sheets to resorptive pads. She is found these  helped somewhat. She does have an appointment with the lymphedema clinic in [place] in late June. Past medical history includes bilateral lymphedema, hypertension, obstructive sleep apnea with CPAP. Recent hospitalization with apparently Pseudomonas cellulitis of the right lower leg 12/15/2019 upon evaluation today patient appears to be doing a little bit worse in regard to her right lower extremity. Unfortunately she is having more weeping down in the lower portion of her leg. Fortunately there is no signs of active infection at this time. No fever chills noted. The patient states she is not having increased pain except for when she attempted to use the lymphedema pumps unfortunately she states that she did have pain when she did this. Otherwise we been using absorptive dressings of one type or another she is using diapers at home and then subsequently Ace wraps. In regard to the barrier cream we have discussed the possibility of derma cloud which she would like to try I do not have a problem with that. 12/22/2019 upon evaluation today patient actually appears to be doing better in regard to her leg ulcers at this point. Fortunately there does not appear to be any signs of active infection which is great news and I am extremely pleased with where things are progressing at this time. There is no sign of active infection currently. The patient is very pleased to see things doing so well. 12/29/2019 upon evaluation today patient appears to be doing a little bit better in regard to her weeping in general over her lower extremities. She does have some signs of mild erythema little bit more than what I noted last week or rather last visit. Nonetheless I think that my threshold for switching her antibiotics from Keflex to something else is very low at this point considering that she has had such severe infections in the past that seem to come almost out of nowhere. There is a little erythema and warmth  noted of the lower portion of her leg compared to the upper which also makes me want to go ahead and address things more rapidly at this point. Likely I would switch out the Keflex for something like Levaquin ideally. 7/16; patient with severe bilateral lymphedema. She has superficial wounds albeit almost circumferential now on the left lateral lower leg. This may be new from last time. Small area on the right anterior lower leg and then another area on the right medial lower leg and of pannus fold. She has been using various absorptive garments. She states she is using her compression pumps once a day occasionally twice. Culture from her last visit here was negative 01/29/2020 on evaluation today patient appears to be doing excellent at this point in regard to her legs with regard to infection I see no signs of active infection at this point. She still does have unfortunately areas of weeping this is minimal on the right now her left is actually significantly worse although I do not think it is as bad as last week with Dr. Dellia Nims saw her. She has been trying to pump and elevate her legs is much as possible. She has previously been on the Keflex and in the past for prevention that seems to do fairly well and likely can extend that today. 02/04/2020 on evaluation today patient appears to be doing better in regard to her legs bilaterally. Fortunately there is no signs of  active infection at this time which is great news and overall she has less weeping on the left compared to the right and there is several spots where she is pretty much sealed up with no draining regions. Overall very pleased in this regard. 02/19/2020 on evaluation today patient appears to be doing very well in regard to her wounds currently. Fortunately there is no evidence of active infection overall very pleased with where things stand. She is significantly improved in regard to her edema I am extremely pleased in this regard she tells  me that the popping no longer hurts and in fact she actually looks forward to it. 03/04/2020 on evaluation today patient appears to be doing excellent in regard to her lower extremities. Fortunately there is no signs of active infection at this time. No fevers, chills, nausea, vomiting, or diarrhea. 03/25/2020 on evaluation today patient appears to be doing a little bit more poorly in regard to her legs at this point. She tells me that she is still continue to have issues with drainage and this has been a little bit worse she was getting ready to start taking the Keflex again but wanted to see me first. Fortunately there is no signs of active infection at this time. No fevers, chills, nausea, vomiting, or diarrhea. 04/15/2020 upon evaluation today patient appears to be doing somewhat poorly in regard to her right leg. She tells me she has been having more pain she has been taking the Keflex that was previously prescribed unfortunately that just does not seem to help with this. She was hoping that the pain on her right was actually coming from the fact that she was having issues with her wrap having gotten caught in her recliner. With that being said she tells me that she knew something was not right. Currently her right leg is warm to touch along with being erythematous all the way up to around at least mid thigh as far as I can see. The left leg does not appear to be doing that badly though there is increased weeping around the ankle region. 05/06/2020 on evaluation today patient appears to be doing much better than last time I saw her. She did go to the hospital where she was admitted for 2 days and treated with antibiotic therapy. She was discharged with antibiotics as well and has done extremely well. I am extremely pleased with where things stand today. There is no signs of active infection at this time which is great news. 05/27/2020 upon evaluation today patient appears to be doing well with  regard to her lower extremities bilaterally. She has just a very tiny area on the right leg which is opening on the left leg she is significantly improved though she still has several areas that do appear to be open this is minimal compared to what is been in the past. In general I am extremely pleased with where things stand today. The patient does tell me she is not been using her pumps quite as much as ZURI, SACCOMANNO ([id number]) [id number] Page 4 of 14 she should be. I do believe that is 1 area she can definitely work on. She has had a lot going on including a Covid exposure and apparently also a outbreak of likely shingles. 06/24/2020 upon evaluation today patient appears to be doing well with regard to her legs in general although the left leg unfortunately is showing some signs of erythema she does have a little bit of increased weeping and  to be honest I am concerned about infection here. I discussed that with her today and I think that we may need to address this sooner rather than later she has been taking Keflex she is not really certain that is been making a big improvement however. No fevers, chills, nausea, vomiting, or diarrhea. 06/30/2020 upon evaluation today patient's legs actually seem to be doing better in my opinion as compared to where they were last week. Fortunately there does not appear to be any signs of active infection. Her culture showed multiple organisms nothing predominate. With that being said the Levaquin seems to have done well I think she has improved since I last saw her as well. 07/14/2020 upon evaluation today patient appears to be doing actually better in regard to her lower extremities in my opinion. She has been tolerating the dressing changes without complication. Fortunately there is no signs of active infection at this time. 08/04/2020 upon evaluation today patient appears to be doing excellent in regard to her leg ulcers.  Fortunately she has very little that is open at this point. This is great news. In fact I think that the Goldbond medicated powder has been excellent for her. It seems to have done the trick where we had tried several other things without as much success. Fortunately there is no evidence of active infection at this time. No fevers, chills, nausea, vomiting, or diarrhea. 09/01/2020 upon evaluation today patient appears to be doing a little bit more poorly than the last time I saw her. She tells me right now that she has been having a lot of drainage compared to where things were previous which has unfortunately led to more irritation as well. She is concerned that this is leading to infection. Again based on what I am seeing today as well I am also concerned of the same to be honest. 09/15/2020 upon evaluation today patient appears to be doing well with regard to her legs compared to what I saw previous. Fortunately there does not appear to be any evidence of active infection at this time which is great news. At least not as badly as what it was previous. With that being said I do believe that the patient does need to have an extension of the antibiotics. This I believe will actually help her more in the way of making sure this stays under control and does not worsen. That was the Bactrim DS. Readmission: 08/01/2021 upon evaluation today patient appears to be doing actually pretty well all things considered. Is actually been almost a year since have seen her last March. Fortunately I do not see any evidence of active infection locally nor systemically at this point. She does have a couple open areas on the left leg the right leg appears to be doing quite well. In general she has been in the hospital 3 times since I saw her a year ago all 4 cellulitis type issues except for the last 1 which was actually more related to congestive heart failure for that reason she is not using her lymphedema pumps at this  point and that is probably the best idea. 08/08/2021 upon evaluation today patient appears to be doing well with regard to her legs all things considered her left leg may be a little bit more swollen based on what I see today. Fortunately I do not see any signs of active infection locally nor systemically at this time which is great news. No fevers, chills, nausea, vomiting, or diarrhea. 08/15/2021 upon  evaluation today patient actually appears to be doing excellent in regard to her wounds. She has been tolerating the dressing changes without complication. Fortunately I see no evidence of infection currently which is great news. No fevers, chills, nausea, vomiting, or diarrhea. 08/29/2021 upon evaluation patient appears to be doing excellent she in fact is almost completely healed. I am actually very pleased with where we stand today and I think that she is making wonderful progress she just has a small area of weeping on the right lateral leg everything else is pretty much completely healed which is also. 09/05/2021 upon evaluation today patient appears to be doing well with regard to her legs. She does have a small area of weeping on the right leg and there is a small area on the left leg as well. It is potentially something that may need to be addressed in the future more specifically but right now I think that there may have just been a little drainage here on the left. With all that being said I think that this still does not appear to be infected and overall I feel like she is doing quite well. That something we always have to keep a very close eye on as she can change very rapidly from okay to not okay. 09/12/2021 upon evaluation today patient appears to be doing a little bit worse in regard to her leg and actually somewhat concerned about the possibility of infection here. I discussed that with the patient. For that reason I am going to go ahead and see about getting her set up for a repeat  prescription for the Bactrim. She is in agreement with that plan. 3/14; patient presents for follow-up. She has been using silver alginate with dressing changes. She is still taking Bactrim prescribed at last clinic visit. She has no issues or complaints today. She has lymphedema pumps however does not use these. 09/26/2020 upon evaluation today patient appears to actually be doing well in regard to her right leg I am pleased in that regard. Unfortunately she has new areas on her left leg that are open at this point that I do need to be addressed. I am also concerned about the warmth around one of the wounds on the more anterior side. I think this is something that we may need to culture today potentially requiring antibiotics going forward. 10/03/2021 upon evaluation today patient unfortunately is not doing nearly as well as she was even last week with her wounds. She is having some issues here with increased cellulitis of the left lower extremity unfortunately. I did prescribe Augmentin for her eczema prescribed last week unfortunately she never actually received this prescription. The pharmacy stated they never got it. We double checked with them today they still did not receive it. I am not sure what happened in that regard. With that being said the patient is in good spirits today she does not appear to be as sick as where she normally is when the cellulitis gets significantly worse as it has in the past. Nonetheless the leg is much more red not just around the ankle where I was seeing at last week on Tuesday but rather now this is going all the way up to almost her knee and is definitely hot to touch compared to the right leg. Nonetheless I feel like she does need to go to the ER for likely IV antibiotics. 10-10-2021 upon evaluation today patient appears to be doing well with regard to her wound. She  has been tolerating the dressing changes that appears to be doing much better. After I saw her last  week she did go to the ER they admitted her to the hospital and gave her IV antibiotics she fortunately is doing significantly better. Overall I am extremely pleased with where things stand today. 10-17-2021 upon evaluation today patient appears to be doing well with regard to her wound. In fact this is looking better and her leg is looking better but she still does have some areas here of erythema. We will continue to address this as soon as possible in my opinion. I do believe she may require some debridement and what appears to be a more defined wound on her left leg at this point. 10-24-2021 upon evaluation patient is definitely showing signs of improvement which is great news. I do not see any evidence of active infection locally or systemically which is great news as well. No fevers, chills, nausea, vomiting, or diarrhea. 10-31-2021. Upon evaluation today patient's leg actually feels better from an infection and wound care perspective. I am actually very pleased in this regard. Unfortunately the biggest issue that I see at this time is that the patient is having a significant issue with her breathing. I did put her on the pulse ox machine to check her oxygen saturation and it was pretty much at 100% which is good but she tells me that when she is walking this is much more difficult for her. She also tells me that when she is trying to bend over to perform her dressing changes she is so short of breath due to the swelling and fluid in her abdominal area that she is just not been able to do this. She is tearful and very concerned today to be honest. I am truly sorry to see her this way I know she is really having a hard time at the moment. 11-14-2021 upon evaluation patient actually appears to be doing significantly better compared to when I last saw her. She was actually admitted to the hospital I believe this for 6 days total after I sent her on 10-31-2021. Subsequently they actually pulled off greater  than 50 pounds of fluid she tells me she feels significantly better her legs are not as tight she actually has some play in the tissue and it is obvious that she is doing much better just from a mobility KYLINN, DILULLO ([id number]) [id number] Page 5 of 14 standpoint as well as a breathing standpoint. Overall I am extremely happy for her and how she is doing the leg also looks much better on the left. 11-21-2021 upon evaluation today patient appears to be doing well although it does appear that she is going require some sharp debridement the wound is very dry this is the opposite of what its been she was having so much weeping that it was staying extremely wet. Nonetheless we do need to see about going ahead and debriding the wound today. 11-28-2021 upon evaluation today patient appears to be doing well with regard to her wound I definitely see signs of things continuing to improve which is great news. I do not see any evidence of active infection locally or systemically also great news. 12-05-2021 upon evaluation today patient appears to be doing well with regard to her wound. She has been tolerating the dressing changes. Fortunately there does not appear to be any signs of active infection locally or systemically at this time. No fevers, chills, nausea, vomiting, or diarrhea. 12-12-2021  upon evaluation patient appears to be doing well with regard to her wound this is actually measuring smaller and looking much better. Fortunately I do not see any signs of active infection locally or systemically at this time which is excellent news. 6/13; small wound area in the folds of her lymphedema just above the left ankle. Fortunately the area looks quite good. She has been using silver alginate ABDs and an Ace wrap which she is able to change her self. She is not using her compression pumps out of fear that this could contribute to heart failure. 12-26-2021 upon evaluation patient's  wounds are actually showing signs of excellent improvement. I am very pleased with where things stand. Overall I think that she has been making excellent progress and I think we are very close to complete resolution which is great news. 01-11-2022 upon evaluation today patient appears to be doing well currently in regard to her legs in general although on the left leg she does have 1 area that still irritated and inflamed on the anterior portion of her leg. Fortunately I do not see any signs of systemic infection locally there might be some infection going on here however which is my biggest concern. Fortunately I do not see any evidence though of this spreading to any other location. 01-16-2022 upon evaluation today patient has actually showing signs of excellent improvement. Fortunately I do not see any evidence of infection locally or systemically which is great news and overall I am very pleased with where we stand at this point. 01-25-2022 upon evaluation today patient actually appears to be doing decently well in regard to her legs. She has been tolerating the dressing changes without complication. Fortunately there does not appear to be any evidence of active infection locally or systemically which is great news and overall I am extremely pleased with where we stand at this point. 02-13-2022 upon evaluation today patient appears to be doing well currently in regard to her wound which is actually showing signs of significant improvement. Fortunately I do not see any evidence of active infection locally or systemically at this time. No fevers, chills, nausea, vomiting, or diarrhea. 02-20-2022 upon evaluation today patient appears to be doing well currently in regard to her wound. She has been tolerating the dressing changes without complication. Fortunately there does not appear to be any signs of active infection locally or systemically at this time. No fevers, chills, nausea, vomiting,  or diarrhea. 02-27-2022 upon evaluation today patient appears to be doing excellent in regard to her wounds on the leg. She has been tolerating the dressing changes without complication this is very close to complete resolution. Fortunately I do not see any signs of infection locally or systemically at this time which is great news. No fevers, chills, nausea, vomiting, or diarrhea. 03-06-2022 upon evaluation today patient appears to be doing well currently in regard to her wound in general. She has been tolerating the dressing changes without complication. Fortunately I do not see any evidence of active infection locally or systemically at this time which is great news. I am concerned a little bit however about not really her wounds but the second toe right foot where there appears to be some cellulitis after she dropped a frying pan on this about 2 weeks ago. She tells me its been itching and burning and I do think that it is possible she may have an infection based on what we are seeing currently. 03-20-2022 upon evaluation today patient actually appears  to be doing much better at this point. Fortunately there does not appear to be any signs of active infection locally or systemically at this time which is great news. No fevers, chills, nausea, vomiting, or diarrhea. 03-27-2022 upon evaluation today patient appears to be doing somewhat poorly in regard to her legs compared to previous. Her right leg has a couple areas that are open and she is very warm and painful to touch around the lateral portion of the ankle area. Left leg is showing signs of little bit more drainage and weeping as well I think that this is probably due to the fact that she is swelling a lot more that she has been in the past. Fortunately I do not see any signs of active infection systemically though locally there is definitely some issues here. 04-03-2022 upon evaluation today patient appears to be doing poorly in regard to her  wounds on the legs unfortunately. She also seems to have more significant cellulitis that has ensued. Unfortunately she has been on doxycycline initially it seemed to be helping but as of this week that is no longer the case. It started getting worse last week and with the reinitiation of the doxycycline nothing has improved. Often when she gets like this IV antibiotics are necessary. 04-19-2022 upon evaluation today patient appears to be doing well currently in regard to her legs from an infection standpoint. She did get IV vancomycin in the hospital and states that her legs did get better when she left she was no longer weeping but she had been in the bed for 4 days straight. With that being said since coming home her legs have begun to weep again the original wound that we were dealing with has healed she does have some weeping over both lower extremities however which is still of concern. 05-03-2022 upon evaluation today patient appears to be doing well currently in regard to her left leg which I feel like is a little bit better unfortunately the right leg is still draining significantly. Fortunately I do not see any signs of infection locally or systemically at this time. No fevers, chills, nausea, vomiting, or diarrhea. 05-10-2022 unfortunately patient actually showed signs of increasing erythema today. I believe that the cellulitis never completely resolved from when she was in the hospital last. Again she has been on doxycycline previously by myself I gave her Keflex more recently as an ongoing prescription to try to prevent infection but again I do not think this is strong enough to be able to get this under control. I really feel like that she is probably can need IV antibiotics and a good period of time in the hospital to be able to keep her legs elevated and not having to get around to do a whole lot in order to allow this to resolve. The patient voiced understanding. 05-24-2022 upon  evaluation today patient appears to be doing excellent in regard to her legs. She has been tolerating the dressing changes without complication. Fortunately there does not appear to be any signs of infection at this time which is great news she has been on cefadroxil since she was discharged from the hospital. The day that we sent her she actually tells me that she ended up in septic shock that day and coded. When she first showed up they did not notice it on the lab work but said by the end of the day she had already undergone this and they had had to address the issue  obviously when she coded. She tells me that likely she would not be here today if she had not gone to the hospital that day. I explained to her that I do believe that was a God thing and it was just the perfect timing that he had for her at that time. 06-14-2022 upon evaluation today patient appears to be doing well currently in regard to her legs. In general I think she is making good progress and I do not see any signs of infection at this time which is great news. No fevers, chills, nausea, vomiting, or diarrhea. 06-28-2022 upon evaluation today patient appears to be doing excellent in regard to her legs. She is actually showing signs of excellent improvement I do not see any evidence of infection locally nor systemically at this time. No fevers, chills, nausea, vomiting, or diarrhea. 07-26-2022 upon eval evaluation today patient appears to be doing well currently in regard to her wound. She has been tolerating the dressing changes without complication. Fortunately I do not see any major issues here she has a couple areas of weeping mainly on the right leg the left leg seems to be doing pretty DEMISHA, LAHTI ([id number]) [id number] Page 6 of 14 well. 08-23-2021 upon evaluation today patient appears to be doing excellent in regard to her legs. These are showing signs of wonderful improvement I am  actually very pleased with where we stand I think she is doing the best that I have ever seen her. Fortunately I do not see any evidence of infection locally or systemically which is great news. Electronic Signature(s) Signed: 08/23/2022 10:48:52 AM By: Worthy Keeler PA-C Entered By: Worthy Keeler on 08/23/2022 10:48:52 -------------------------------------------------------------------------------- Physical Exam Details Patient Name: Date of Service: ARIYAN, CLEMENS 08/23/2022 10:00 A M Medical Record Number: [id number] Patient Account Number: [id number] Date of Birth/Sex: Treating RN: December 08, 1955 (67 y.o. [place] Primary Care Provider: Ranae Plumber Other Clinician: Massie Kluver Referring Provider: Treating Provider/Extender: Skeet Simmer in Treatment: 48 Constitutional Well-nourished and well-hydrated in no acute distress. Respiratory normal breathing without difficulty. Psychiatric this patient is able to make decisions and demonstrates good insight into disease process. Alert and Oriented x 3. pleasant and cooperative. Notes Upon inspection patient's wound bed actually showed signs of good granulation epithelization at this point. Fortunately I see no signs of active infection which is great news and overall I do believe that she is headed in the right direction. Electronic Signature(s) Signed: 08/23/2022 10:49:07 AM By: Worthy Keeler PA-C Entered By: Worthy Keeler on 08/23/2022 10:49:07 -------------------------------------------------------------------------------- Physician Orders Details Patient Name: Date of Service: MARQUEL, HAKE 08/23/2022 10:00 A M Medical Record Number: [id number] Patient Account Number: [id number] Date of Birth/Sex: Treating RN: 1955/11/21 (67 y.o. 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 Nurse Visit as needed Bathing/ Shower/ Hygiene Clean wound with Normal Saline or wound cleanser. Wash wounds with antibacterial soap and water. May shower; gently cleanse wound with antibacterial soap, rinse and pat dry prior to dressing  wounds No tub bath. Edema Control - Lymphedema / Segmental Compressive Device / Other A wraps ce Elevate, Exercise Daily and A void Standing for Long Periods of Time. Elevate leg(s) parallel to the floor when sitting. DO YOUR BEST to sleep in the bed at night. DO NOT sleep in your recliner. Long hours of sitting in a recliner leads to swelling of the legs and/or potential wounds on your backside. Other: - use lymphedema pumps Non-Wound Condition Bilateral Lower Extremities Cleanse affected area with antibacterial soap and water, pply appropriate compression. - use abdominal pads them ace wrap to secure A dditional non-wound orders/instructions: - silver cell to open weeping areas A Medications-Please add to medication list. ntibiotic - apply Mupirocin to weepy areas as needed Topical A Electronic Signature(s) Signed: 08/23/2022 11:57:56 AM By: Massie Kluver Signed: 08/23/2022 5:39:55 PM By: Worthy Keeler PA-C Entered By: Massie Kluver on 08/23/2022 10:42:47 -------------------------------------------------------------------------------- Problem List Details Patient Name: Date of Service: [name] 08/23/2022 10:00 A M Medical Record Number: [id number] Patient Account Number:  [id number] Date of Birth/Sex: Treating RN: [birth year] (67 y.o. 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 08/01/2021 No Yes of skin L97.821 Non-pressure chronic ulcer of other part of left lower leg limited to breakdown 08/01/2021 No Yes [name], [name] ([id number]) [id number] Page 8 of 14 of skin Inactive Problems Resolved Problems Electronic Signature(s) Signed: 08/23/2022 10:00:06 AM By: Worthy Keeler PA-C Entered By: Worthy Keeler on 08/23/2022 10:00:06 -------------------------------------------------------------------------------- Progress Note Details Patient Name: Date of Service: MYLEE, BOWICK 08/23/2022 10:00 A M Medical Record Number: [id number] Patient Account Number: [id number] Date of Birth/Sex: Treating RN: 19-May-1956 (67 y.o. [place] Primary Care Provider: Ranae Plumber Other Clinician: Massie Kluver Referring Provider: Treating Provider/Extender: Skeet Simmer in Treatment: 82 Subjective Chief Complaint Information obtained from Patient Bilateral LE lymphedema with Left LE Ulcers History of Present Illness (HPI) The patient is a 67 year old female with history of hypertension and a long-standing history of bilateral lower extremity lymphedema (first presented on 4/2) . She has had open ulcers in the past which have always responded to compression therapy. She had briefly been to a lymphedema clinic in the past which helped her at the time. this time around she stopped treatment of her lymphedema pumps approximately 2 weeks ago because of some pain in the  knees and then noticed the right leg getting worse. She was seen by her PCP who put her on clindamycin 4 times a day 2 days ago. The patient has seen [facility] and Dr. Delana Meyer had seen her last year where a vascular study including venous and arterial duplex studies were within normal limits. he had recommended compression stockings and lymphedema pumps and the patient has been using this in about 2 weeks ago. She is known to be diabetic but in the past few time she's gone to her primary care doctor her hemoglobin A1c has been normal. 02/11/2015 - after her last visit she took my advice and went to the ER regarding the progressive cellulitis of her right lower extremity and she was admitted between July 17 and 22nd. She received IV antibiotics and then was sent home on a course of steroid-induced and oral  antibiotics. She has improved much since then. 02/17/2015 -- she has been doing fine and the weeping of her legs has remarkably gone down. She has no fresh issues. READMISSION 01/15/18 This patient was given this clinic before most recently in 2016 seen by Dr. Con Memos. She has massive bilateral lymphedema and over the last 2 months this had weeping edema out of the left leg. She has compression pumps but her compliance with these has been minimal. She has [facility] they've been using TCA/ABDs/kerlix under an Ace wrap.she has had recent problems with cellulitis. She was apparently seen in the ER and 12/23/17 and given clindamycin. She was then followed by her primary doctor and given doxycycline and Keflex. The pain seems to have settled down. In April 2018 the patient had arterial studies done at [facility]. This showed triphasic waveforms throughout the right leg and mostly triphasic waveforms on the left except for monophasic at the posterior tibial artery distally. She was not felt to have evidence of right lower extremity arterial stenosis or significant problems on the left  side. 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 She also had a right lower extremity venous Doppler in January 2018 this was limited by the patient's body habitus and lymphedema. Most of the proximal veins were not visualized The patient presents with an area of denuded skin on the anterior medial part of the left calf. There is weeping edema fluid here. 01/22/18; the patient has somewhat better edema control using her compression pumps twice a day and as a result she has much better epithelialization on the left anterior calf area. Only a small open area remains. 01/29/18; the patient has been compliant with her compression pumps. Both the areas on her calf that healed. The remaining area on the left anterior leg is fully epithelialized Readmission: 02/20/2019 upon evaluation today patient presents for reevaluation due to issues that she is having with the bilateral lower extremities. She actually has wounds [name], [name] ([id number]) [id number] Page 9 of 14 open on both legs. On the right she has an area in the crease of her leg on the right around the knee region which is actually draining quite a bit and actually has some fungal type appearance to it. She has been on nystatin powder that seems to have helped to some degree. In regard to the left lower extremity this is actually in the lower portion of her leg closer to the ankle and again is continuing to drain as well unfortunately. There does not appear to be any signs of active infection at this time which is good news. No fevers, chills, nausea, vomiting, or diarrhea. She tells me that since she was seen last year she is actually been doing quite well for the most part with regard to her lower extremities. Unfortunately she now is experiencing a little bit more drainage at this time. She is concerned about getting this under control so that it does not get significantly worse. 02/27/2019  on evaluation today patient appears to be doing somewhat better in regard to her bilateral lower extremity wounds. She has been tolerating the dressing changes without complication. Fortunately there is no signs of active infection at this point. No fevers, chills, nausea, vomiting, or diarrhea. She did get her dressing supplies which is excellent news she was extremely excited to get these. She also got paperwork from [employer] for their financial assistance program where they may be able to help her out  in the future if needed with supplies at discounted prices. 03/06/2019 on evaluation today patient appears to be doing a little worse with regard to both areas of weeping on her bilateral lower extremities. This is around the right medial knee and just above the left ankle. With that being said she is unfortunately not doing as well as I would like to see. I feel like she may need to potentially go see someone at the lymphedema clinic as the wraps that she needs or even beyond what we can do here at the wound care center. She really does not have wounds she just has open areas of weeping that are causing some difficulty for her. Subsequently because of this and the moisture I am concerned about the potential for infection I am going to likely give her a prophylactic antibiotic today, Keflex, just to be on the safe side. Nonetheless again there is no obvious signs of active infection at this time. 03/13/2019 on evaluation today patient appears to be doing well with regard to her bilateral lower extremities where she has been weeping compared to even last week's evaluation. I see some areas of new skin growth which is excellent and overall I am very pleased with how things seem to be progressing. No fevers, chills, nausea, vomiting, or diarrhea. 03/20/2019 on evaluation today patient unfortunately is continuing to have issues with significant edema of the left lower extremity. Her right side seems to be doing  much better. Unfortunately her left side is showing increased weeping of the lower portion of her leg. This is quite unfortunate obviously we were hoping to get her into the lymphedema clinic they really do not seem to when I see her how if she is draining. Despite the fact this is really not wound related but more lymphedema weeping related. Nonetheless I do not know that this can be helpful for her to even go for that appointment since again I am not sure there is much that they would actually do at this point. We may need to try a 4 layer compression wrap as best we can on her leg. She is on the Augmentin currently although I am still concerned about whether or not there could be potentially something going on infection wise I would obtain a culture though I understand is not the best being that is a surface culture I just 1 to make sure I do not seem to be missing anything. 03/27/2019 on evaluation today patient appears to be doing much better in regard to the left lower extremity compared to last week. Last week she had tremendous weeping which I think was subsequent to infection now she seems to be doing much better and very pleased. This is not completely healed but there is a lot of new skin growth and it has dried out quite a bit. Overall I think that we are doing well with how things are moving along at this time. No fevers, chills, nausea, vomiting, or diarrhea. 04/03/2019 evaluation today patient appears to be doing a little worse this week compared to last time I saw her. I think this may be due to the fact that she is having issues with not being able to sleep in her bed at least not until last night. She is therefore been in a lift chair and subsequently has also had issues with not been able to use her pumps since she could not get in bed. With that being said the patient overall seems to be  doing okay I do think I may want extend the antibiotic for a little bit longer at least until we  can see if her edema and her weeping gets better and if it is then obviously I can always discontinue the antibiotics as of next week however I want her to continue to have it over the next week. 04/10/2019 on evaluation today patient unfortunately is still doing poorly with regard to her left lower extremity. Her right is all things considering doing fairly well. On the left however she continues to have spreading of the area of infection and weeping which appears to be even a larger surface area than noted last week. She did have a positive culture for Pseudomonas in particular which seems to have been of concern she still has green/yellow discharge consistent with Pseudomonas and subsequently a tremendous amount of it. This has me obviously still concerned about the infection not really clearing up despite the fact that on culture it appears the Cipro should have been a good option for treating this. I think she may at this point need IV antibiotics since things are not doing better I do not want to get worse and cause sepsis. She is in agreement with the plan and believes as well that she likely does need to go to the hospital for IV vancomycin. Or something of the like depending on what the recommendation is from the ER. 04/17/2019 on evaluation today patient appears to be doing excellent in regard to her lower extremity on the left. She was in the hospital for several days from when I sent her last we saw her until just this past Tuesday. Fortunately her drainage is significantly improved and in fact is mostly clear. There is just a couple small areas that may still drain a little bit she states that the [facility] they prescribed for her at discharge she went picked up from pharmacy and got home but has not been able to find it since. She is looked everywhere. She is wondering if I will replace that for her today I will be more than happy to do that. 05/01/2019 on evaluation today patient actually  appears to be doing quite well with regard to her lower extremities. She occasionally is having areas that will leak and then heal up mainly when a piece of the fibrotic skin pops off but fortunately she is not having any signs of active infection at this time. Overall she also really does not have any obvious weeping at this time. I do believe however she really needs some compression wraps and I think this may be a good time to get her back to the lymphedema clinic. 05/11/2019 on evaluation today patient actually appears to be doing quite well with regard to her bilateral lower extremities. She occasionally will have a small area that we per another but in general seems to be completely healed which is great news. Overall very pleased with how everything seems to be progressing. She does have her appointment with lymphedema clinic on November 18. 05/25/2019 on evaluation today patient appears to be doing well with regard to her left lower extremity. I am very pleased in this regard. In regard to her right leg this actually did start draining more I think it is mainly due to the fact that her leg is more swollen. I am not seeing any obvious signs of infection at this time although that is definitely something were obviously acutely aware of simply due to the fact that  she had an issue not too far back with exactly this issue. Nonetheless I do feel like that lymphedema clinic would still be beneficial for her. I explained obviously if they are not able to do anything treatment wise on the right leg we could at least have them treat her left leg and then proceed from there. The patient is really in agreement with that plan. If they are able to do both as the drainage slows down that I would be happy to let them handle both. 06/01/2019 on evaluation today patient unfortunately appears to be doing worse with regard to her right lower extremity. The left lower extremity is still maintaining at this point.  Unfortunately she has been having significantly increased pain over the past several days and has been experiencing as well increased swelling of the right lower extremity. I really do not know that I am seeing anything that appears to be obvious for infection at this point to be peripherally honest. With that being said the patient does seem to be having much more swelling that she is even experienced in the past and coupled with increased pain in her hip as well I am concerned that again she could potentially have a DVT although I am not 100% sure of this. I think it something that may need to be checked out. We discussed the possibility of sending her for a DVT study through the hospital but unfortunately transportation is an issue if she does have a DVT I do not want her to wait days to be able to get in for that test however if she has this scheduled as an outpatient that is as fast that she will be able to get the test scheduled for transportation purposes. That will also fall on Thanksgiving so subsequently she did actually be looking at either Friday or even next week before we would know anything back from this. That is much too long in my opinion. Subsequent to the amount of discomfort she is experiencing the patient is actually okay with going to the ER for evaluation today. 06/12/2019 on evaluation today patient actually appears to be doing significantly better compared to last time I saw her. Following when I last saw her she was actually in the hospital from that Monday until the following Sunday almost 1 full week. She actually was placed on Keflex in the hospital following the time for her to be discharged and Dr. Steva Ready has recommended 2 times a day dosing of the Keflex for the next year in order to help with more prophylactic/preventative measures with regard to her developing cellulitis. Overall I think this sounds like an excellent plan. The patient unfortunately is good to have  trouble being treated at lymphedema clinic due to the fact that she really cannot get up on the bed that they have there. They also state that they cannot manage her as long as she has anything draining at this point. Obviously that is somewhat unfortunate as she does need help with edema control but nonetheless we will have to do what we can for her outside of it sounds like the lymphedema clinic scenario at this point. 06/19/2019 on evaluation today patient appears to be doing fairly well with regard to her bilateral lower extremities. She is not nearly as swollen and shows no signs of infection at this point. There is no evidence of cellulitis whatsoever. She also has no open wounds or draining at this point which is also good news. No Sobiech,  TAJMA KIELTY ([id number]) [id number] Page 10 of 14 fever chills noted. She seems to be in very good spirits and in fact appears to be doing quite well. READMISSION 11/27/2019 This is a 67 year old woman that we have had in this clinic several times before including 2015, 16 and 19 and then most recently from 03/20/2019 through 06/19/2019 with bilateral lower extremity lymphedema. She has had previous arterial and reflux studies done years ago which were not all that remarkable. 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 She does have a positive family history and she had large legs starting may be in her 54s. She was recently in hospital from 10/20/2019 through 10/28/2019 with right leg cellulitis. She was given Ancef and clindamycin and then Zosyn when a culture showed Pseudomonas. At that time there was purulent drainage. She was followed by infectious disease Dr Steva Ready. The patient is now back at home. She has noted increased swelling in the right and no drainage in her right leg mostly on the posterior medial aspect in the calf area. She has not had pain or fever. She has  literally been improved lysing above dressings because her at the area of this is far too large for standard compression. She has been wrapping the areas with sheets to resorptive pads. She is found these helped somewhat. She does have an appointment with the lymphedema clinic in [place] in late June. Past medical history includes bilateral lymphedema, hypertension, obstructive sleep apnea with CPAP. Recent hospitalization with apparently Pseudomonas cellulitis of the right lower leg 12/15/2019 upon evaluation today patient appears to be doing a little bit worse in regard to her right lower extremity. Unfortunately she is having more weeping down in the lower portion of her leg. Fortunately there is no signs of active infection at this time. No fever chills noted. The patient states she is not having increased pain except for when she attempted to use the lymphedema pumps unfortunately she states that she did have pain when she did this. Otherwise we been using absorptive dressings of one type or another she is using diapers at home and then subsequently Ace wraps. In regard to the barrier cream we have discussed the possibility of derma cloud which she would like to try I do not have a problem with that. 12/22/2019 upon evaluation today patient actually appears to be doing better in regard to her leg ulcers at this point. Fortunately there does not appear to be any signs of active infection which is great news and I am extremely pleased with where things are progressing at this time. There is no sign of active infection currently. The patient is very pleased to see things doing so well. 12/29/2019 upon evaluation today patient appears to be doing a little bit better in regard to her weeping in general over her lower extremities. She does have some signs of mild erythema little bit more than what I noted last week or rather last visit. Nonetheless I think that my threshold for switching her  antibiotics from Keflex to something else is very low at this point considering that she has had such severe infections in the past that seem to come almost out of nowhere. There is a little erythema and warmth noted of the lower portion of her leg compared to the upper which also makes me want to go ahead and address things more rapidly at this point. Likely I would switch out the Keflex for something  like Levaquin ideally. 7/16; patient with severe bilateral lymphedema. She has superficial wounds albeit almost circumferential now on the left lateral lower leg. This may be new from last time. Small area on the right anterior lower leg and then another area on the right medial lower leg and of pannus fold. She has been using various absorptive garments. She states she is using her compression pumps once a day occasionally twice. Culture from her last visit here was negative 01/29/2020 on evaluation today patient appears to be doing excellent at this point in regard to her legs with regard to infection I see no signs of active infection at this point. She still does have unfortunately areas of weeping this is minimal on the right now her left is actually significantly worse although I do not think it is as bad as last week with Dr. Dellia Nims saw her. She has been trying to pump and elevate her legs is much as possible. She has previously been on the Keflex and in the past for prevention that seems to do fairly well and likely can extend that today. 02/04/2020 on evaluation today patient appears to be doing better in regard to her legs bilaterally. Fortunately there is no signs of active infection at this time which is great news and overall she has less weeping on the left compared to the right and there is several spots where she is pretty much sealed up with no draining regions. Overall very pleased in this regard. 02/19/2020 on evaluation today patient appears to be doing very well in regard to her wounds  currently. Fortunately there is no evidence of active infection overall very pleased with where things stand. She is significantly improved in regard to her edema I am extremely pleased in this regard she tells me that the popping no longer hurts and in fact she actually looks forward to it. 03/04/2020 on evaluation today patient appears to be doing excellent in regard to her lower extremities. Fortunately there is no signs of active infection at this time. No fevers, chills, nausea, vomiting, or diarrhea. 03/25/2020 on evaluation today patient appears to be doing a little bit more poorly in regard to her legs at this point. She tells me that she is still continue to have issues with drainage and this has been a little bit worse she was getting ready to start taking the Keflex again but wanted to see me first. Fortunately there is no signs of active infection at this time. No fevers, chills, nausea, vomiting, or diarrhea. 04/15/2020 upon evaluation today patient appears to be doing somewhat poorly in regard to her right leg. She tells me she has been having more pain she has been taking the Keflex that was previously prescribed unfortunately that just does not seem to help with this. She was hoping that the pain on her right was actually coming from the fact that she was having issues with her wrap having gotten caught in her recliner. With that being said she tells me that she knew something was not right. Currently her right leg is warm to touch along with being erythematous all the way up to around at least mid thigh as far as I can see. The left leg does not appear to be doing that badly though there is increased weeping around the ankle region. 05/06/2020 on evaluation today patient appears to be doing much better than last time I saw her. She did go to the hospital where she was admitted for 2 days  and treated with antibiotic therapy. She was discharged with antibiotics as well and has done  extremely well. I am extremely pleased with where things stand today. There is no signs of active infection at this time which is great news. 05/27/2020 upon evaluation today patient appears to be doing well with regard to her lower extremities bilaterally. She has just a very tiny area on the right leg which is opening on the left leg she is significantly improved though she still has several areas that do appear to be open this is minimal compared to what is been in the past. In general I am extremely pleased with where things stand today. The patient does tell me she is not been using her pumps quite as much as she should be. I do believe that is 1 area she can definitely work on. She has had a lot going on including a Covid exposure and apparently also a outbreak of likely shingles. 06/24/2020 upon evaluation today patient appears to be doing well with regard to her legs in general although the left leg unfortunately is showing some signs of erythema she does have a little bit of increased weeping and to be honest I am concerned about infection here. I discussed that with her today and I think that we may need to address this sooner rather than later she has been taking Keflex she is not really certain that is been making a big improvement however. No fevers, chills, nausea, vomiting, or diarrhea. 06/30/2020 upon evaluation today patient's legs actually seem to be doing better in my opinion as compared to where they were last week. Fortunately there does not appear to be any signs of active infection. Her culture showed multiple organisms nothing predominate. With that being said the Levaquin seems to have done well I think she has improved since I last saw her as well. 07/14/2020 upon evaluation today patient appears to be doing actually better in regard to her lower extremities in my opinion. She has been tolerating the dressing changes without complication. Fortunately there is no signs of active  infection at this time. 08/04/2020 upon evaluation today patient appears to be doing excellent in regard to her leg ulcers. Fortunately she has very little that is open at this point. This is great news. In fact I think that the [employer] medicated powder has been excellent for her. It seems to have done the trick where we had tried several other things without as much success. Fortunately there is no evidence of active infection at this time. No fevers, chills, nausea, vomiting, or diarrhea. 09/01/2020 upon evaluation today patient appears to be doing a little bit more poorly than the last time I saw her. She tells me right now that she has been having a lot of drainage compared to where things were previous which has unfortunately led to more irritation as well. She is concerned that this is leading to YURIRIA, WEISEL ([id number]) [id number] Page 11 of 14 infection. Again based on what I am seeing today as well I am also concerned of the same to be honest. 09/15/2020 upon evaluation today patient appears to be doing well with regard to her legs compared to what I saw previous. Fortunately there does not appear to be any evidence of active infection at this time which is great news. At least not as badly as what it was previous. With that being said I do believe that the patient does need to have an extension of the antibiotics.  This I believe will actually help her more in the way of making sure this stays under control and does not worsen. That was the Bactrim DS. Readmission: 08/01/2021 upon evaluation today patient appears to be doing actually pretty well all things considered. Is actually been almost a year since have seen her last March. Fortunately I do not see any evidence of active infection locally nor systemically at this point. She does have a couple open areas on the left leg the right leg appears to be doing quite well. In general she has been in the hospital 3  times since I saw her a year ago all 4 cellulitis type issues except for the last 1 which was actually more related to congestive heart failure for that reason she is not using her lymphedema pumps at this point and that is probably the best idea. 08/08/2021 upon evaluation today patient appears to be doing well with regard to her legs all things considered her left leg may be a little bit more swollen based on what I see today. Fortunately I do not see any signs of active infection locally nor systemically at this time which is great news. No fevers, chills, nausea, vomiting, or diarrhea. 08/15/2021 upon evaluation today patient actually appears to be doing excellent in regard to her wounds. She has been tolerating the dressing changes without complication. Fortunately I see no evidence of infection currently which is great news. No fevers, chills, nausea, vomiting, or diarrhea. 08/29/2021 upon evaluation patient appears to be doing excellent she in fact is almost completely healed. I am actually very pleased with where we stand today and I think that she is making wonderful progress she just has a small area of weeping on the right lateral leg everything else is pretty much completely healed which is also. 09/05/2021 upon evaluation today patient appears to be doing well with regard to her legs. She does have a small area of weeping on the right leg and there is a small area on the left leg as well. It is potentially something that may need to be addressed in the future more specifically but right now I think that there may have just been a little drainage here on the left. With all that being said I think that this still does not appear to be infected and overall I feel like she is doing quite well. That something we always have to keep a very close eye on as she can change very rapidly from okay to not okay. 09/12/2021 upon evaluation today patient appears to be doing a little bit worse in regard to her  leg and actually somewhat concerned about the possibility of infection here. I discussed that with the patient. For that reason I am going to go ahead and see about getting her set up for a repeat prescription for the Bactrim. She is in agreement with that plan. 3/14; patient presents for follow-up. She has been using silver alginate with dressing changes. She is still taking Bactrim prescribed at last clinic visit. She has no issues or complaints today. She has lymphedema pumps however does not use these. 09/26/2020 upon evaluation today patient appears to actually be doing well in regard to her right leg I am pleased in that regard. Unfortunately she has new areas on her left leg that are open at this point that I do need to be addressed. I am also concerned about the warmth around one of the wounds on the more anterior side. I  think this is something that we may need to culture today potentially requiring antibiotics going forward. 10/03/2021 upon evaluation today patient unfortunately is not doing nearly as well as she was even last week with her wounds. She is having some issues here with increased cellulitis of the left lower extremity unfortunately. I did prescribe Augmentin for her eczema prescribed last week unfortunately she never actually received this prescription. The pharmacy stated they never got it. We double checked with them today they still did not receive it. I am not sure what happened in that regard. With that being said the patient is in good spirits today she does not appear to be as sick as where she normally is when the cellulitis gets significantly worse as it has in the past. Nonetheless the leg is much more red not just around the ankle where I was seeing at last week on Tuesday but rather now this is going all the way up to almost her knee and is definitely hot to touch compared to the right leg. Nonetheless I feel like she does need to go to the ER for likely IV  antibiotics. 10-10-2021 upon evaluation today patient appears to be doing well with regard to her wound. She has been tolerating the dressing changes that appears to be doing much better. After I saw her last week she did go to the ER they admitted her to the hospital and gave her IV antibiotics she fortunately is doing significantly better. Overall I am extremely pleased with where things stand today. 10-17-2021 upon evaluation today patient appears to be doing well with regard to her wound. In fact this is looking better and her leg is looking better but she still does have some areas here of erythema. We will continue to address this as soon as possible in my opinion. I do believe she may require some debridement and what appears to be a more defined wound on her left leg at this point. 10-24-2021 upon evaluation patient is definitely showing signs of improvement which is great news. I do not see any evidence of active infection locally or systemically which is great news as well. No fevers, chills, nausea, vomiting, or diarrhea. 10-31-2021. Upon evaluation today patient's leg actually feels better from an infection and wound care perspective. I am actually very pleased in this regard. Unfortunately the biggest issue that I see at this time is that the patient is having a significant issue with her breathing. I did put her on the pulse ox machine to check her oxygen saturation and it was pretty much at 100% which is good but she tells me that when she is walking this is much more difficult for her. She also tells me that when she is trying to bend over to perform her dressing changes she is so short of breath due to the swelling and fluid in her abdominal area that she is just not been able to do this. She is tearful and very concerned today to be honest. I am truly sorry to see her this way I know she is really having a hard time at the moment. 11-14-2021 upon evaluation patient actually appears to be  doing significantly better compared to when I last saw her. She was actually admitted to the hospital I believe this for 6 days total after I sent her on 10-31-2021. Subsequently they actually pulled off greater than 50 pounds of fluid she tells me she feels significantly better her legs are not as tight  she actually has some play in the tissue and it is obvious that she is doing much better just from a mobility standpoint as well as a breathing standpoint. Overall I am extremely happy for her and how she is doing the leg also looks much better on the left. 11-21-2021 upon evaluation today patient appears to be doing well although it does appear that she is going require some sharp debridement the wound is very dry this is the opposite of what its been she was having so much weeping that it was staying extremely wet. Nonetheless we do need to see about going ahead and debriding the wound today. 11-28-2021 upon evaluation today patient appears to be doing well with regard to her wound I definitely see signs of things continuing to improve which is great news. I do not see any evidence of active infection locally or systemically also great news. 12-05-2021 upon evaluation today patient appears to be doing well with regard to her wound. She has been tolerating the dressing changes. Fortunately there does not appear to be any signs of active infection locally or systemically at this time. No fevers, chills, nausea, vomiting, or diarrhea. 12-12-2021 upon evaluation patient appears to be doing well with regard to her wound this is actually measuring smaller and looking much better. Fortunately I do not see any signs of active infection locally or systemically at this time which is excellent news. 6/13; small wound area in the folds of her lymphedema just above the left ankle. Fortunately the area looks quite good. She has been using silver alginate ABDs and an Ace wrap which she is able to change her self. She is  not using her compression pumps out of fear that this could contribute to heart failure. 12-26-2021 upon evaluation patient's wounds are actually showing signs of excellent improvement. I am very pleased with where things stand. Overall I think BERTINA, OGLE ([id number]) [id number] Page 12 of 14 that she has been making excellent progress and I think we are very close to complete resolution which is great news. 01-11-2022 upon evaluation today patient appears to be doing well currently in regard to her legs in general although on the left leg she does have 1 area that still irritated and inflamed on the anterior portion of her leg. Fortunately I do not see any signs of systemic infection locally there might be some infection going on here however which is my biggest concern. Fortunately I do not see any evidence though of this spreading to any other location. 01-16-2022 upon evaluation today patient has actually showing signs of excellent improvement. Fortunately I do not see any evidence of infection locally or systemically which is great news and overall I am very pleased with where we stand at this point. 01-25-2022 upon evaluation today patient actually appears to be doing decently well in regard to her legs. She has been tolerating the dressing changes without complication. Fortunately there does not appear to be any evidence of active infection locally or systemically which is great news and overall I am extremely pleased with where we stand at this point. 02-13-2022 upon evaluation today patient appears to be doing well currently in regard to her wound which is actually showing signs of significant improvement. Fortunately I do not see any evidence of active infection locally or systemically at this time. No fevers, chills, nausea, vomiting, or diarrhea. 02-20-2022 upon evaluation today patient appears to be doing well currently in regard to her wound. She has been  tolerating the dressing changes without complication. Fortunately there does not appear to be any signs of active infection locally or systemically at this time. No fevers, chills, nausea, vomiting, or diarrhea. 02-27-2022 upon evaluation today patient appears to be doing excellent in regard to her wounds on the leg. She has been tolerating the dressing changes without complication this is very close to complete resolution. Fortunately I do not see any signs of infection locally or systemically at this time which is great news. No fevers, chills, nausea, vomiting, or diarrhea. 03-06-2022 upon evaluation today patient appears to be doing well currently in regard to her wound in general. She has been tolerating the dressing changes without complication. Fortunately I do not see any evidence of active infection locally or systemically at this time which is great news. I am concerned a little bit however about not really her wounds but the second toe right foot where there appears to be some cellulitis after she dropped a frying pan on this about 2 weeks ago. She tells me its been itching and burning and I do think that it is possible she may have an infection based on what we are seeing currently. 03-20-2022 upon evaluation today patient actually appears to be doing much better at this point. Fortunately there does not appear to be any signs of active infection locally or systemically at this time which is great news. No fevers, chills, nausea, vomiting, or diarrhea. 03-27-2022 upon evaluation today patient appears to be doing somewhat poorly in regard to her legs compared to previous. Her right leg has a couple areas that are open and she is very warm and painful to touch around the lateral portion of the ankle area. Left leg is showing signs of little bit more drainage and weeping as well I think that this is probably due to the fact that she is swelling a lot more that she has been in the past. Fortunately  I do not see any signs of active infection systemically though locally there is definitely some issues here. 04-03-2022 upon evaluation today patient appears to be doing poorly in regard to her wounds on the legs unfortunately. She also seems to have more significant cellulitis that has ensued. Unfortunately she has been on doxycycline initially it seemed to be helping but as of this week that is no longer the case. It started getting worse last week and with the reinitiation of the doxycycline nothing has improved. Often when she gets like this IV antibiotics are necessary. 04-19-2022 upon evaluation today patient appears to be doing well currently in regard to her legs from an infection standpoint. She did get IV vancomycin in the hospital and states that her legs did get better when she left she was no longer weeping but she had been in the bed for 4 days straight. With that being said since coming home her legs have begun to weep again the original wound that we were dealing with has healed she does have some weeping over both lower extremities however which is still of concern. 05-03-2022 upon evaluation today patient appears to be doing well currently in regard to her left leg which I feel like is a little bit better unfortunately the right leg is still draining significantly. Fortunately I do not see any signs of infection locally or systemically at this time. No fevers, chills, nausea, vomiting, or diarrhea. 05-10-2022 unfortunately patient actually showed signs of increasing erythema today. I believe that the cellulitis never completely resolved from when  she was in the hospital last. Again she has been on doxycycline previously by myself I gave her Keflex more recently as an ongoing prescription to try to prevent infection but again I do not think this is strong enough to be able to get this under control. I really feel like that she is probably can need IV antibiotics and a good period of  time in the hospital to be able to keep her legs elevated and not having to get around to do a whole lot in order to allow this to resolve. The patient voiced understanding. 05-24-2022 upon evaluation today patient appears to be doing excellent in regard to her legs. She has been tolerating the dressing changes without complication. Fortunately there does not appear to be any signs of infection at this time which is great news she has been on cefadroxil since she was discharged from the hospital. The day that we sent her she actually tells me that she ended up in septic shock that day and coded. When she first showed up they did not notice it on the lab work but said by the end of the day she had already undergone this and they had had to address the issue obviously when she coded. She tells me that likely she would not be here today if she had not gone to the hospital that day. I explained to her that I do believe that was a God thing and it was just the perfect timing that he had for her at that time. 06-14-2022 upon evaluation today patient appears to be doing well currently in regard to her legs. In general I think she is making good progress and I do not see any signs of infection at this time which is great news. No fevers, chills, nausea, vomiting, or diarrhea. 06-28-2022 upon evaluation today patient appears to be doing excellent in regard to her legs. She is actually showing signs of excellent improvement I do not see any evidence of infection locally nor systemically at this time. No fevers, chills, nausea, vomiting, or diarrhea. 07-26-2022 upon eval evaluation today patient appears to be doing well currently in regard to her wound. She has been tolerating the dressing changes without complication. Fortunately I do not see any major issues here she has a couple areas of weeping mainly on the right leg the left leg seems to be doing pretty well. 08-23-2021 upon evaluation today patient appears  to be doing excellent in regard to her legs. These are showing signs of wonderful improvement I am actually very pleased with where we stand I think she is doing the best that I have ever seen her. Fortunately I do not see any evidence of infection locally or systemically which is great news. Objective Constitutional Well-nourished and well-hydrated in no acute distress. Vitals Time Taken: 10:28 AM, Height: 63 in, Weight: 372 lbs, BMI: 65.9, Temperature: 98.1 F, Pulse: 94 bpm, Respiratory Rate: 18 breaths/min, Blood [name], [name] C ([id number]) [id number] Page 13 of 14 Pressure: 158/89 mmHg. Respiratory normal breathing without difficulty. Psychiatric this patient is able to make decisions and demonstrates good insight into disease process. Alert and Oriented x 3. pleasant and cooperative. General Notes: Upon inspection patient's wound bed actually showed signs of good granulation epithelization at this point. Fortunately I see no signs of active infection which is great news and overall I do believe that she is headed in the right direction. Other Condition(s) Patient presents with Lymphedema located on the Right Leg. General Notes:  swelling is minimal, no redness or drainage noted Patient presents with Lymphedema located on the Left Leg. 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 Nurse Visit as needed Bathing/ Shower/ Hygiene: Clean wound with Normal Saline or wound cleanser. Wash wounds with antibacterial soap and water. May shower; gently cleanse wound with antibacterial soap, rinse and pat dry prior to dressing wounds No tub bath. Edema  Control - Lymphedema / Segmental Compressive Device / Other: Ace wraps Elevate, Exercise Daily and Avoid Standing for Long Periods of Time. Elevate leg(s) parallel to the floor when sitting. DO YOUR BEST to sleep in the bed at night. DO NOT sleep in your recliner. Long hours of sitting in a recliner leads to swelling of the legs and/or potential wounds on your backside. Other: - use lymphedema pumps Non-Wound Condition: Cleanse affected area with antibacterial soap and water, Apply appropriate compression. - use abdominal pads them ace wrap to secure Additional non-wound orders/instructions: - silver cell to open weeping areas Medications-Please add to medication list.: Topical Antibiotic - apply Mupirocin to weepy areas as needed 1. I am going to suggest that we have the patient continue with what she is doing the spironolactone seems to be making excellent improvement for her overall and this is excellent. 2. Also can recommend that we have her continue to do her pumps and elevate her legs. 3 she should continue as well with her treatment at the heart failure clinic. They seem to be doing a great job helping her to manage the heart failure. We will see patient back for reevaluation in 1 week here in the clinic. If anything worsens or changes patient will contact our office for additional recommendations. Electronic Signature(s) Signed: 08/23/2022 10:49:40 AM By: Worthy Keeler PA-C Entered By: Worthy Keeler on 08/23/2022 10:49:40 [name], [name] ([id number]) [id number] Page 14 of 14 -------------------------------------------------------------------------------- SuperBill Details Patient Name: Date of Service: [name], [name] 08/23/2022 Medical Record Number: [id number] Patient Account Number: [id number] Date of Birth/Sex: Treating RN: October 01, 1955 (67 y.o. Charolette Forward, Kim Primary Care Provider: Ranae Plumber Other Clinician: Massie Kluver Referring  Provider: Treating Provider/Extender: Skeet Simmer in Treatment: 55 Diagnosis Coding ICD-10 Codes Code Description Q82.0 Hereditary lymphedema L97.811 Non-pressure chronic ulcer of other part of right lower leg limited to breakdown of skin L97.821 Non-pressure chronic ulcer of other part of left lower leg limited to breakdown of skin Facility Procedures : CPT4 Code: [id number] Description: [phone] - WOUND CARE VISIT-LEV 2 EST PT Modifier: Quantity: 1 Physician Procedures : CPT4 Code Description Modifier [id number] - WC PHYS LEVEL 3 - EST PT ICD-10 Diagnosis Description Q82.0 Hereditary lymphedema L97.811 Non-pressure chronic ulcer of other part of right lower leg limited to breakdown of skin L97.821 Non-pressure  chronic ulcer of other part of left lower leg limited to breakdown of skin Quantity: 1 Electronic Signature(s) Signed: 08/23/2022 10:50:06 AM By: Worthy Keeler PA-C Entered By: Worthy Keeler on 08/23/2022 10:50:05

## 2022-08-24 NOTE — Progress Notes (Signed)
Elizabeth, [name] ([id number]) [phone].pdf Page 1 of 7 Visit Report for 08/23/2022 Arrival Information Details Patient Name: Date of Service: [name], [name] 08/23/2022 10:00 A M Medical Record Number: [id number] Patient Account Number: [id number] Date of Birth/Sex: Treating RN: 01-27-1956 (67 y.o. [name], Kim Primary Care Vanesha Athens: Ranae Plumber Other Clinician: Massie Kluver Referring Anshika Pethtel: Treating Indiana Pechacek/Extender: Skeet Simmer in Treatment: 52 Visit Information History Since Last Visit All ordered tests and consults were completed: No Patient Arrived: Gilford Rile Added or deleted any medications: No Arrival Time: 10:24 Any new allergies or adverse reactions: No Transfer Assistance: None Had a fall or experienced change in No Patient Identification Verified: Yes activities of daily living that may affect Secondary Verification Process Completed: Yes risk of falls: Patient Requires Transmission-Based Precautions: No Signs or symptoms of abuse/neglect since last visito No Patient Has Alerts: Yes Hospitalized since last visit: No Patient Alerts: Borderline Diabetic Implantable device outside of the clinic excluding No cellular tissue based products placed in the center since last visit: Has Dressing in Place as Prescribed: Yes Pain Present Now: No Electronic Signature(s) Signed: 08/23/2022 11:57:56 AM By: Massie Kluver Entered By: Massie Kluver on 08/23/2022 10:27:53 -------------------------------------------------------------------------------- Clinic Level of Care Assessment Details Patient Name: Date of Service: Elizabeth, [name] 08/23/2022 10:00 A M Medical Record Number: [id number] Patient Account Number: [id number] Date of Birth/Sex: Treating RN: Jan 17, 1956 (67 y.o. [name] Primary Care Lezli Danek: Ranae Plumber Other Clinician: Massie Kluver Referring Mckade Gurka: Treating Alvan Culpepper/Extender: Skeet Simmer in Treatment: 55 Clinic Level of Care Assessment Items TOOL 4 Quantity Score []$  - 0 Use when only an EandM is performed on FOLLOW-UP visit ASSESSMENTS - Nursing Assessment / Reassessment X- 1 10 Reassessment of Co-morbidities (includes updates in patient status) X- 1 5 Reassessment of Adherence to Treatment Plan TASHELL, ASLAM ([id number]) [phone].pdf Page 2 of 7 ASSESSMENTS - Wound and Skin A ssessment / Reassessment []$  - 0 Simple Wound Assessment / Reassessment - one wound []$  - 0 Complex Wound Assessment / Reassessment - multiple wounds []$  - 0 Dermatologic / Skin Assessment (not related to wound area) ASSESSMENTS - Focused Assessment []$  - 0 Circumferential Edema Measurements - multi extremities []$  - 0 Nutritional Assessment / Counseling / Intervention []$  - 0 Lower Extremity Assessment (monofilament, tuning fork, pulses) []$  - 0 Peripheral Arterial Disease Assessment (using hand held doppler) ASSESSMENTS - Ostomy and/or Continence Assessment and Care []$  - 0 Incontinence Assessment and Management []$  - 0 Ostomy Care Assessment and Management (repouching, etc.) PROCESS - Coordination of Care X - Simple Patient / Family Education for ongoing care 1 15 []$  - 0 Complex (extensive) Patient / Family Education for ongoing care []$  - 0 Staff obtains Programmer, systems, Records, T Results / Process Orders est []$  - 0 Staff telephones HHA, Nursing Homes / Clarify orders / etc []$  - 0 Routine Transfer to another Facility (non-emergent condition) []$  - 0 Routine Hospital Admission (non-emergent condition) []$  - 0 New Admissions / Biomedical engineer / Ordering NPWT Apligraf, etc. , []$  - 0 Emergency Hospital Admission (emergent condition) X- 1 10 Simple Discharge Coordination []$  - 0 Complex (extensive) Discharge Coordination PROCESS - Special Needs []$  - 0 Pediatric / Minor Patient Management []$  - 0 Isolation Patient Management []$   - 0 Hearing / Language / Visual special needs []$  - 0 Assessment of Community assistance (transportation, D/C planning, etc.) []$  - 0 Additional assistance / Altered mentation []$  - 0 Support Surface(s) Assessment (bed, cushion, seat,  etc.) INTERVENTIONS - Wound Cleansing / Measurement []$  - 0 Simple Wound Cleansing - one wound []$  - 0 Complex Wound Cleansing - multiple wounds []$  - 0 Wound Imaging (photographs - any number of wounds) []$  - 0 Wound Tracing (instead of photographs) []$  - 0 Simple Wound Measurement - one wound []$  - 0 Complex Wound Measurement - multiple wounds INTERVENTIONS - Wound Dressings X - Small Wound Dressing one or multiple wounds 2 10 []$  - 0 Medium Wound Dressing one or multiple wounds []$  - 0 Large Wound Dressing one or multiple wounds []$  - 0 Application of Medications - topical []$  - 0 Application of Medications - injection INTERVENTIONS - Miscellaneous []$  - 0 External ear exam SHANNAN, CAUGHEY ([id number]) [id number] Page 3 of 7 []$  - 0 Specimen Collection (cultures, biopsies, blood, body fluids, etc.) []$  - 0 Specimen(s) / Culture(s) sent or taken to Lab for analysis []$  - 0 Patient Transfer (multiple staff / Harrel Lemon Lift / Similar devices) []$  - 0 Simple Staple / Suture removal (25 or less) []$  - 0 Complex Staple / Suture removal (26 or more) []$  - 0 Hypo / Hyperglycemic Management (close monitor of Blood Glucose) []$  - 0 Ankle / Brachial Index (ABI) - do not check if billed separately X- 1 5 Vital Signs Has the patient been seen at the hospital within the last three years: Yes Total Score: 65 Level Of Care: New/Established - Level 2 Electronic Signature(s) Signed: 08/23/2022 11:57:56 AM By: Massie Kluver Entered By: Massie Kluver on 08/23/2022 10:43:46 -------------------------------------------------------------------------------- Encounter Discharge Information Details Patient Name: Date of Service: [name].  08/23/2022 10:00 [place] Record Number: [id number] Patient Account Number: [id number] Date of Birth/Sex: Treating RN: 05/28/1956 (67 y.o. [name] Primary Care Bitania Shankland: Ranae Plumber Other Clinician: Massie Kluver Referring Kersti Scavone: Treating Francessca Friis/Extender: Skeet Simmer in Treatment: 14 Encounter Discharge Information Items Discharge Condition: Stable Ambulatory Status: Cane Discharge Destination: Home Transportation: Other Accompanied By: self Schedule Follow-up Appointment: Yes Clinical Summary of Care: Electronic Signature(s) Signed: 08/23/2022 11:57:56 AM By: Massie Kluver Entered By: Massie Kluver on 08/23/2022 10:48:46 Multi Wound Chart Details -------------------------------------------------------------------------------- [name] ([id number]) [id number] Page 4 of 7 Patient Name: Date of Service: BAYLYN, WEYGANDT 08/23/2022 10:00 A M Medical Record Number: [id number] Patient Account Number: [id number] Date of Birth/Sex: Treating RN: 1956/04/09 (67 y.o. [name] Primary Care Emely Fahy: Ranae Plumber Other Clinician: Massie Kluver Referring Chanah Tidmore: Treating Walker Sitar/Extender: Skeet Simmer in Treatment: 55 Vital Signs Height(in): 63 Pulse(bpm): 94 Weight(lbs): 372 Blood Pressure(mmHg): 158/89 Body Mass Index(BMI): 65.9 Temperature(F): 98.1 Respiratory Rate(breaths/min): 18 Wound Assessments Treatment Notes Electronic Signature(s) Signed: 08/23/2022 11:57:56 AM By: Massie Kluver Entered By: Massie Kluver on 08/23/2022 10:36:24 -------------------------------------------------------------------------------- Multi-Disciplinary Care Plan Details Patient Name: Date of Service: [name]. 08/23/2022 10:00 A M Medical Record Number: [id number] Patient Account Number: [id number] Date of Birth/Sex: Treating RN: 1956-02-02 (68 y.o. [name] Primary Care  Dennisse Swader: Ranae Plumber Other Clinician: Massie Kluver Referring Karliah Kowalchuk: Treating Jazarah Capili/Extender: Skeet Simmer in Treatment: 55 Active Inactive Wound/Skin Impairment Nursing Diagnoses: Impaired tissue integrity Knowledge deficit related to ulceration/compromised skin integrity Goals: Ulcer/skin breakdown will have a volume reduction of 30% by week 4 Date Initiated: 08/01/2021 Target Resolution Date: 08/29/2021 Goal Status: Active Ulcer/skin breakdown will have a volume reduction of 50% by week 8 Date Initiated: 08/01/2021 Target Resolution Date: 09/26/2021 Goal Status: Active Ulcer/skin breakdown will have a volume reduction of 80% by  week 12 Date Initiated: 08/01/2021 Target Resolution Date: 10/24/2021 Goal Status: Active Ulcer/skin breakdown will heal within 14 weeks Date Initiated: 08/01/2021 Target Resolution Date: 11/07/2021 Goal Status: Active Interventions: Assess patient/caregiver ability to obtain necessary supplies Assess patient/caregiver ability to perform ulcer/skin care regimen upon admission and as needed Assess ulceration(s) every visit Provide education on ulcer and skin care LEASTER, RUGGERIO ([id number]) [phone].pdf Page 5 of 7 Notes: Electronic Signature(s) Signed: 08/23/2022 11:57:56 AM By: Massie Kluver Signed: 08/24/2022 1:08:07 PM By: Gretta Cool, BSN, RN, CWS, Kim RN, BSN Entered By: Massie Kluver on 08/23/2022 10:48:09 -------------------------------------------------------------------------------- Non-Wound Condition Assessment Details Patient Name: Date of Service: TAMICHA, TOWSLEY 08/23/2022 10:00 A M Medical Record Number: [id number] Patient Account Number: [id number] Date of Birth/Sex: Treating RN: 1955/12/06 (67 y.o. [name] Primary Care Amarisa Wilinski: Ranae Plumber Other Clinician: Massie Kluver Referring Cherine Drumgoole: Treating Jolanta Cabeza/Extender: Skeet Simmer in Treatment:  69 Non-Wound Condition: Condition: Lymphedema Location: Leg Side: Right Notes: Right leg with decreased drainage this visit. Notes swelling is minimal, no redness or drainage noted Electronic Signature(s) Signed: 08/23/2022 11:57:56 AM By: Massie Kluver Signed: 08/24/2022 1:08:07 PM By: Gretta Cool, BSN, RN, CWS, Kim RN, BSN Entered By: Massie Kluver on 08/23/2022 10:35:59 -------------------------------------------------------------------------------- Non-Wound Condition Assessment Details Patient Name: Date of Service: SHARIYAH, NELLES 08/23/2022 10:00 A M Medical Record Number: [id number] Patient Account Number: [id number] Date of Birth/Sex: Treating RN: Nov 18, 1955 (67 y.o. [name] Primary Care Rayola Everhart: Ranae Plumber Other Clinician: Massie Kluver Referring River Mckercher: Treating Blondell Laperle/Extender: Skeet Simmer in Treatment: 50 Non-Wound Condition: Condition: Lymphedema Location: Leg Side: Left Notes: decreased drainage this visit. LORRIANE, RAJSKI ([id number]) [phone].pdf Page 6 of 7 Notes swelling is minimal, no drainage or redness noted Electronic Signature(s) Signed: 08/23/2022 11:57:56 AM By: Massie Kluver Signed: 08/24/2022 1:08:07 PM By: Gretta Cool, BSN, RN, CWS, Kim RN, BSN Entered By: Massie Kluver on 08/23/2022 10:36:02 -------------------------------------------------------------------------------- Pain Assessment Details Patient Name: Date of Service: YINA, GEDDES 08/23/2022 10:00 A M Medical Record Number: [id number] Patient Account Number: [id number] Date of Birth/Sex: Treating RN: 05/18/1956 (67 y.o. [name] Primary Care Emmarose Klinke: Ranae Plumber Other Clinician: Massie Kluver Referring Charbel Los: Treating Ariel Dimitri/Extender: Skeet Simmer in Treatment: 55 Active Problems Location of Pain Severity and Description of Pain Patient Has Paino No Site Locations Pain Management and  Medication Current Pain Management: Electronic Signature(s) Signed: 08/23/2022 11:57:56 AM By: Massie Kluver Signed: 08/24/2022 1:08:07 PM By: Gretta Cool, BSN, RN, CWS, Kim RN, BSN Entered By: Massie Kluver on 08/23/2022 10:30:22 MILICA, MOHRBACHER ([id number]) [id number] Page 7 of 7 -------------------------------------------------------------------------------- Patient/Caregiver Education Details Patient Name: Date of Service: Spacek, JEA N C. 2/15/2024andnbsp10:00 A M Medical Record Number: [id number] Patient Account Number: [id number] Date of Birth/Gender: Treating RN: June 30, 1956 (67 y.o. [name] Primary Care Physician: Ranae Plumber Other Clinician: Massie Kluver Referring Physician: Treating Physician/Extender: Skeet Simmer in Treatment: 36 Education Assessment Education Provided To: Patient Education Topics Provided Wound/Skin Impairment: Handouts: Other: continue wound care as directed Methods: Explain/Verbal Responses: State content correctly Electronic Signature(s) Signed: 08/23/2022 11:57:56 AM By: Massie Kluver Entered By: Massie Kluver on 08/23/2022 10:48:04 -------------------------------------------------------------------------------- Vitals Details Patient Name: Date of Service: [name]. 08/23/2022 10:00 A M Medical Record Number: [id number] Patient Account Number: [id number] Date of Birth/Sex: Treating RN: 04/07/1956 (67 y.o. [name] Primary Care Kion Huntsberry: Ranae Plumber Other Clinician: Massie Kluver Referring Jackson Coffield: Treating Cannon Quinton/Extender: Skeet Simmer in Treatment: 55 Vital Signs  Time Taken: 10:28 Temperature (F): 98.1 Height (in): 63 Pulse (bpm): 94 Weight (lbs): 372 Respiratory Rate (breaths/min): 18 Body Mass Index (BMI): 65.9 Blood Pressure (mmHg): 158/89 Reference Range: 80 - 120 mg / dl Electronic Signature(s) Signed: 08/23/2022 11:57:56 AM By:  Massie Kluver Entered By: Massie Kluver on 08/23/2022 10:30:18

## 2022-08-31 ENCOUNTER — Encounter: Payer: Self-pay | Admitting: Family

## 2022-08-31 ENCOUNTER — Encounter: Payer: Self-pay | Admitting: Pharmacist

## 2022-08-31 ENCOUNTER — Ambulatory Visit (HOSPITAL_BASED_OUTPATIENT_CLINIC_OR_DEPARTMENT_OTHER): Payer: Medicare HMO | Admitting: Family

## 2022-08-31 ENCOUNTER — Other Ambulatory Visit
Admission: RE | Admit: 2022-08-31 | Discharge: 2022-08-31 | Disposition: A | Discharge status: Home / Self Care | Payer: Medicare HMO | Source: Ambulatory Visit | Attending: Family | Admitting: Family

## 2022-08-31 VITALS — BP 117/80 | HR 94 | Resp 20 | Wt 335.0 lb

## 2022-08-31 DIAGNOSIS — G4733 Obstructive sleep apnea (adult) (pediatric): Secondary | ICD-10-CM

## 2022-08-31 DIAGNOSIS — I890 Lymphedema, not elsewhere classified: Secondary | ICD-10-CM

## 2022-08-31 DIAGNOSIS — I5032 Chronic diastolic (congestive) heart failure: Secondary | ICD-10-CM

## 2022-08-31 DIAGNOSIS — I10 Essential (primary) hypertension: Secondary | ICD-10-CM | POA: Diagnosis not present

## 2022-08-31 LAB — BASIC METABOLIC PANEL
Anion gap: 12 (ref 5–15)
BUN: 47 mg/dL — ABNORMAL HIGH (ref 8–23)
CO2: 27 mmol/L (ref 22–32)
Calcium: 9.5 mg/dL (ref 8.9–10.3)
Chloride: 97 mmol/L — ABNORMAL LOW (ref 98–111)
Creatinine, Ser: 1.81 mg/dL — ABNORMAL HIGH (ref 0.44–1.00)
GFR, Estimated: 30 mL/min — ABNORMAL LOW (ref >60)
Glucose, Bld: 93 mg/dL (ref 70–99)
Potassium: 3.5 mmol/L (ref 3.5–5.1)
Sodium: 136 mmol/L (ref 135–145)

## 2022-08-31 NOTE — Patient Instructions (Signed)
Take prilosec as 1 tablet daily for the next 2 weeks   Take your bumex every other day

## 2022-08-31 NOTE — Progress Notes (Signed)
Patient ID: [name], female    DOB: [birth year], 67 y.o.   MRN: [id number]  HPI  Ms Oman is a 67 y/o female with a history of asthma, HTN, arthritis, lymphedema, sleep apnea, spinal stenosis, obesity, previous tobacco use and chronic heart failure.   Echo report from 11/01/21 reviewed and showed an EF of 60-65%.   Admitted 05/10/22 due to worsening edema and drainage. Patient evaluated and found to have cellulitis of the right lower extremity. Had AKI. Vascular and wound consults obtained. Antibiotics started. Became symptomatic with elevated lactic acid and WBC. IVF given and antibiotic changed. CT scan 11/7 did not show any drainable fluid collection. Discharged after 6 days. Admitted 04/03/22 due to right lower leg cellulitis that had failed out patient therapy. Initially given IV antibiotics and then changed to oral meds. She had hypokalemia and hypomagnesemia that were repleted. Discharged after 4 days.   She presents today for a follow-up visit with a chief complaint of minimal fatigue with moderate exertion. Describes this as chronic in nature. Has associated cough, lymphedema (much better), palpitations, occasional dizziness, constipation and chronic pain along with this. Denies any difficulty sleeping, abdominal distention, chest pain, wheezing or SOB.   Along with the constipation, she's been having quite a bit of reflux/ indigestion. Also notes a decreased appetite. Will hold her bumes on occasion to "give the kidneys a break"  Notes that her lymphedema is "much improved". She says that her legs are softer and that the wound center said that her legs "never looked better".   Past Medical History:  Diagnosis Date   Arthritis    Asthma    CHF (congestive heart failure) ([place])    Enlarged heart    Hypertension    Lymphedema    Sleep apnea    Spinal stenosis    Past Surgical History:  Procedure Laterality Date   CENTRAL LINE INSERTION N/A 05/11/2022   Procedure:  CENTRAL LINE INSERTION;  Surgeon: Katha Cabal, MD;  Location: [facility];  Service: Cardiovascular;  Laterality: N/A;   COLONOSCOPY WITH PROPOFOL N/A 03/12/2018   Procedure: COLONOSCOPY WITH PROPOFOL;  Surgeon: Manya Silvas, MD;  Location: [facility] ENDOSCOPY;  Service: Endoscopy;  Laterality: N/A;   NO PAST SURGERIES     Family History  Problem Relation Age of Onset   Breast cancer Maternal Aunt    Thyroid disease Other    Allergies  Allergen Reactions   Vancomycin Other (See Comments)    Red man syndrome   Ace Inhibitors Hives and Swelling   Ibuprofen Hives and Swelling   Prior to Admission medications   Medication Sig Start Date End Date Taking? Authorizing Provider  acetaminophen (TYLENOL) 500 MG tablet Take 500-1,000 mg by mouth every 6 (six) hours as needed for mild pain or fever.   Yes [provider]  bumetanide (BUMEX) 2 MG tablet Take 1 tablet (2 mg total) by mouth daily. 03/06/22  Yes Zamia Tyminski, Otila Kluver A, FNP  cetirizine (ZYRTEC) 10 MG tablet Take 1 tablet (10 mg total) by mouth daily. Patient taking differently: Take 10 mg by mouth daily as needed for allergies. 12/25/20  Yes Lavina Hamman, MD  dapagliflozin propanediol (FARXIGA) 10 MG TABS tablet Take 1 tablet (10 mg total) by mouth daily before breakfast. 06/20/22  Yes Darylene Price A, FNP  metolazone (ZAROXOLYN) 5 MG tablet Take 0.5 tablets (2.5 mg total) by mouth 2 (two) times a week. Patient taking differently: Take 2.5 mg by mouth 2 (  two) times a week. Tues and sat 05/03/22  Yes Darylene Price A, FNP  mupirocin ointment (BACTROBAN) 2 % Apply 1 Application topically daily as needed (wound care). 07/26/22  Yes [provider]  NYSTATIN powder Apply 1 Application topically 2 (two) times daily as needed (Yeast Skin infection). 03/19/22  Yes [provider]  potassium chloride SA (KLOR-CON M) 20 MEQ tablet Take 2 tablets (40 mEq total) by mouth daily. 11/28/21  Yes Darylene Price A, FNP   spironolactone (ALDACTONE) 25 MG tablet Take 0.5 tablets (12.5 mg total) by mouth daily. 07/25/22  Yes Toneshia Coello, Otila Kluver A, FNP  albuterol (VENTOLIN HFA) 108 (90 Base) MCG/ACT inhaler Inhale 1-2 puffs into the lungs every 6 (six) hours as needed for wheezing or shortness of breath. Patient not taking: Reported on 08/31/2022    [provider]  senna-docusate (SENOKOT-S) 8.6-50 MG tablet Take 1 tablet by mouth 2 (two) times daily as needed for mild constipation or moderate constipation. Patient not taking: Reported on 08/31/2022    [provider]    Review of Systems  Constitutional:  Positive for appetite change (decreased) and fatigue.  HENT:  Positive for congestion. Negative for postnasal drip and sore throat.   Eyes: Negative.   Respiratory:  Positive for cough. Negative for shortness of breath and wheezing.   Cardiovascular:  Positive for palpitations and leg swelling. Negative for chest pain.  Gastrointestinal:  Positive for constipation. Negative for abdominal distention and abdominal pain.  Endocrine: Negative.   Genitourinary: Negative.   Musculoskeletal:  Positive for arthralgias (right foot) and back pain (when laying in the bed too long).  Skin: Negative.        Severe lymphedema although better  Allergic/Immunologic: Negative.   Neurological:  Positive for dizziness (at times). Negative for light-headedness.  Hematological:  Negative for adenopathy. Does not bruise/bleed easily.  Psychiatric/Behavioral:  Negative for dysphoric mood and sleep disturbance (sleeping well in lift chair). The patient is not nervous/anxious.    Vitals:   08/31/22 1001  BP: 117/80  Pulse: (!) 105  Resp: 20  SpO2: 97%  Weight: (!) 335 lb (152 kg)   Wt Readings from Last 3 Encounters:  08/31/22 (!) 335 lb (152 kg)  07/23/22 (!) 389 lb (176.4 kg)  06/20/22 (!) 398 lb (180.5 kg)   Lab Results  Component Value Date   CREATININE 1.28 (H) 08/03/2022   CREATININE 1.32 (H)  07/23/2022   CREATININE 1.18 (H) 06/20/2022   Physical Exam Vitals and nursing note reviewed.  Constitutional:      Appearance: She is well-developed.  HENT:     Head: Normocephalic and atraumatic.  Cardiovascular:     Rate and Rhythm: Regular rhythm. Tachycardia present.  Pulmonary:     Effort: Pulmonary effort is normal. No accessory muscle usage.     Breath sounds: No wheezing, rhonchi or rales.  Abdominal:     Palpations: Abdomen is soft.     Tenderness: There is no abdominal tenderness.  Musculoskeletal:     Cervical back: Normal range of motion and neck supple.     Right lower leg: Edema present.     Left lower leg: Edema present.     Comments: Severe lymphedema although legs are softer  Skin:    General: Skin is warm and dry.  Neurological:     General: No focal deficit present.     Mental Status: She is alert and oriented to person, place, and time.  Psychiatric:  Mood and Affect: Mood is not depressed.        Behavior: Behavior normal.   Assessment & Plan:  1: Chronic heart failure with preserved ejection fraction without structural changes- - NYHA class II - euvolemic today - has attempted  to weigh daily with standing on 2 scales but it fluctuates too much - weight down 54 pounds from last visit here 5 weeks ago - limiting her sodium intake but occasionally will eat saltier foods  - understands to keep daily fluid intake to 60-64 ounces/ day - facial/mouth swelling with ACEi - farxiga '10mg'$   - spironolactone 12.'5mg'$  QD - bumex '2mg'$  daily (although occasionally skips a day); will change this to QOD with her dramatic weight loss - metolazone 2.'5mg'$  twice weekly - potassium 85mq daily - saw cardiology (Margarito Courser 03/19/22 - BNP 10/31/21 was 123.9 - PharmD reconciled medications with the patient  2: HTN with stage 3 CKD- - BP 117/80 - [facility] - saw nephrology (Holley Raring 05/07/22  - BMP 08/03/22 reviewed and showed sodium 137, potassium  3.8, creatinine 1.28 and GFR 46 - BMP today  3: Lymphedema- - saw wound center 08/23/22  4: Sleep apnea- - wearing CPAP nightly   Medication list reviewed.  Return in 1 month, sooner if needed.

## 2022-09-03 NOTE — Progress Notes (Signed)
Patient ID: [name], female   DOB: 13-Oct-1955, 67 y.o.   MRN: [id number] [place] - PHARMACIST COUNSELING NOTE  *HFpEF*  ACE/ARB/ARNI:  na/ - swelling with ACEi Beta Blocker:  atenolol discontinued d/t hypotension Aldosterone Antagonist:  spironolactone 12.mg daily Diuretic:Bumetanide '2mg'$  every day, plus metolazone 2.'5mg'$  on Tu and Sat SGLT2i:  dapagliflozin '10mg'$  daily   Adherence Assessment  Do you ever forget to take your medication? '[]'$ Yes '[x]'$ No  Do you ever skip doses due to side effects? '[]'$ Yes '[x]'$ No  Do you have trouble affording your medicines? '[]'$ Yes '[x]'$ No  Are you ever unable to pick up your medication due to transportation difficulties? '[]'$ Yes '[x]'$ No  Do you ever stop taking your medications because you don't believe they are helping? '[x]'$ Yes '[]'$ No  Do you check your weight daily? '[x]'$ Yes '[]'$ No   Adherence strategy: none  Barriers to obtaining medications: none  Vital signs: HR 117, BP 129/79, weight (pounds) 392 lbs  ECHO: Date 11/01/21, EF 60-65%     Latest Ref Rng & Units 08/31/2022   11:14 AM 08/03/2022   10:19 AM 07/23/2022   11:55 AM  BMP  Glucose 70 - 99 mg/dL 93  98  89   BUN 8 - 23 mg/dL 47  35  27   Creatinine 0.44 - 1.00 mg/dL 1.81  1.28  1.32   Sodium 135 - 145 mmol/L 136  137  137   Potassium 3.5 - 5.1 mmol/L 3.5  3.8  3.7   Chloride 98 - 111 mmol/L 97  99  101   CO2 22 - 32 mmol/L '27  27  25   '$ Calcium 8.9 - 10.3 mg/dL 9.5  9.4  8.4     Past Medical History:  Diagnosis Date   Arthritis    Asthma    CHF (congestive heart failure) (HCC)    Enlarged heart    Hypertension    Lymphedema    Sleep apnea    Spinal stenosis     ASSESSMENT 67 year old female who presents to the HF clinic for follow up. PMH includes asthma, HTN, arthritis, lymphedema, sleep apnea, spinal stenosis, obesity, previous tobacco use and chronic heart failure. Last admission to acute care was Nov/2023 for worsening edema  and cellulitis.  Medication reconciliation completed today during OV. Patient complains today of constipation , decreased appetite and reflux. Lymphoedema much improved and free of any infection. She still self-adjust diuretics occasionally.  PLAN  BMET today Change Bumex to '2mg'$  every other day   Time spent: 15 minutes  Jewel Mcafee Rodriguez-Guzman PharmD, BCPS 09/03/2022 5:47 PM   Current Outpatient Medications:    acetaminophen (TYLENOL) 500 MG tablet, Take 500-1,000 mg by mouth every 6 (six) hours as needed for mild pain or fever., Disp: , Rfl:    albuterol (VENTOLIN HFA) 108 (90 Base) MCG/ACT inhaler, Inhale 1-2 puffs into the lungs every 6 (six) hours as needed for wheezing or shortness of breath. (Patient not taking: Reported on 08/31/2022), Disp: , Rfl:    bumetanide (BUMEX) 2 MG tablet, Take 1 tablet (2 mg total) by mouth daily. (Patient taking differently: Take 2 mg by mouth every other day.), Disp: 30 tablet, Rfl: 5   cetirizine (ZYRTEC) 10 MG tablet, Take 1 tablet (10 mg total) by mouth daily. (Patient taking differently: Take 10 mg by mouth daily as needed for allergies.), Disp: 60 tablet, Rfl: 0   dapagliflozin propanediol (FARXIGA) 10 MG TABS tablet, Take 1 tablet (10 mg total)  by mouth daily before breakfast., Disp: 30 tablet, Rfl: 5   metolazone (ZAROXOLYN) 5 MG tablet, Take 0.5 tablets (2.5 mg total) by mouth 2 (two) times a week. (Patient taking differently: Take 2.5 mg by mouth 2 (two) times a week. Tues and sat), Disp: 12 tablet, Rfl: 3   mupirocin ointment (BACTROBAN) 2 %, Apply 1 Application topically daily as needed (wound care)., Disp: , Rfl:    NYSTATIN powder, Apply 1 Application topically 2 (two) times daily as needed (Yeast Skin infection)., Disp: , Rfl:    potassium chloride SA (KLOR-CON M) 20 MEQ tablet, Take 2 tablets (40 mEq total) by mouth daily., Disp: 180 tablet, Rfl: 3   senna-docusate (SENOKOT-S) 8.6-50 MG tablet, Take 1 tablet by mouth 2 (two) times daily as  needed for mild constipation or moderate constipation. (Patient not taking: Reported on 08/31/2022), Disp: , Rfl:    spironolactone (ALDACTONE) 25 MG tablet, Take 0.5 tablets (12.5 mg total) by mouth daily., Disp: 15 tablet, Rfl: 3

## 2022-09-26 NOTE — Progress Notes (Unsigned)
Patient ID: [name], female    DOB: [birth year], 67 y.o.   MRN: [id number]  HPI  [name] is a 67 y/o female with a history of asthma, HTN, arthritis, lymphedema, sleep apnea, spinal stenosis, obesity, previous tobacco use and chronic heart failure.   Echo report from 11/01/21 reviewed and showed an EF of 60-65%.   Admitted 05/10/22 due to worsening edema and drainage. Patient evaluated and found to have cellulitis of the right lower extremity. Had AKI. Vascular and wound consults obtained. Antibiotics started. Became symptomatic with elevated lactic acid and WBC. IVF given and antibiotic changed. CT scan 11/7 did not show any drainable fluid collection. Discharged after 6 days. Admitted 04/03/22 due to right lower leg cellulitis that had failed out patient therapy. Initially given IV antibiotics and then changed to oral meds. She had hypokalemia and hypomagnesemia that were repleted. Discharged after 4 days.   She presents today for a HF follow-up visit with a chief complaint of minimal fatigue with moderate exertion. Chronic in nature. Has associated cough, lymphedema, occasional palpitations, dizziness and chronic pain along with this. Denies difficulty sleeping, abdominal distention, chest pain, wheezing, SOB or chest tightness.   Isn't able to weigh at home due to her lymphedema but says that she has been feeling more tight in the legs and so resumed her bumex daily (was QOD) for ~ the last week. Admits to not watching her sodium / fluid intake as much as she should. Says that for her birthday, she was brought pizza which she knew would be high in sodium She is drinking 40 ounces of water along with an unknown amount of gingerale and cranberry juice.   Past Medical History:  Diagnosis Date   Arthritis    Asthma    CHF (congestive heart failure) ([place])    Enlarged heart    Hypertension    Lymphedema    Sleep apnea    Spinal stenosis    Past Surgical History:  Procedure  Laterality Date   CENTRAL LINE INSERTION N/A 05/11/2022   Procedure: CENTRAL LINE INSERTION;  Surgeon: Katha Cabal, MD;  Location: [place] [facility];  Service: Cardiovascular;  Laterality: N/A;   COLONOSCOPY WITH PROPOFOL N/A 03/12/2018   Procedure: COLONOSCOPY WITH PROPOFOL;  Surgeon: Manya Silvas, MD;  Location: [facility] ENDOSCOPY;  Service: Endoscopy;  Laterality: N/A;   NO PAST SURGERIES     Family History  Problem Relation Age of Onset   Breast cancer Maternal Aunt    Thyroid disease Other    Allergies  Allergen Reactions   Vancomycin Other (See Comments)    Red man syndrome   Ace Inhibitors Hives and Swelling   Ibuprofen Hives and Swelling   Prior to Admission medications   Medication Sig Start Date End Date Taking? Authorizing Provider  acetaminophen (TYLENOL) 500 MG tablet Take 500-1,000 mg by mouth every 6 (six) hours as needed for mild pain or fever.   Yes [provider]  bumetanide (BUMEX) 2 MG tablet Take 1 tablet (2 mg total) by mouth daily. 03/06/22  Yes Ronin Crager, Otila Kluver A, FNP  cetirizine (ZYRTEC) 10 MG tablet Take 1 tablet (10 mg total) by mouth daily. Patient taking differently: Take 10 mg by mouth daily as needed for allergies. 12/25/20  Yes Lavina Hamman, MD  dapagliflozin propanediol (FARXIGA) 10 MG TABS tablet Take 1 tablet (10 mg total) by mouth daily before breakfast. 06/20/22  Yes Darylene Price A, FNP  metolazone (ZAROXOLYN) 5 MG tablet  Take 0.5 tablets (2.5 mg total) by mouth 2 (two) times a week. Patient taking differently: Take 2.5 mg by mouth 2 (two) times a week. Tues and sat 05/03/22  Yes Darylene Price A, FNP  mupirocin ointment (BACTROBAN) 2 % Apply 1 Application topically daily as needed (wound care). 07/26/22  Yes [provider]  NYSTATIN powder Apply 1 Application topically 2 (two) times daily as needed (Yeast Skin infection). 03/19/22  Yes [provider]  potassium chloride SA (KLOR-CON M) 20 MEQ tablet Take 2 tablets  (40 mEq total) by mouth daily. 11/28/21  Yes Merrick Maggio A, FNP  senna-docusate (SENOKOT-S) 8.6-50 MG tablet Take 1 tablet by mouth 2 (two) times daily as needed for mild constipation or moderate constipation.   Yes [provider]  spironolactone (ALDACTONE) 25 MG tablet Take 0.5 tablets (12.5 mg total) by mouth daily. 07/25/22  Yes Raylen Ken, Otila Kluver A, FNP  albuterol (VENTOLIN HFA) 108 (90 Base) MCG/ACT inhaler Inhale 1-2 puffs into the lungs every 6 (six) hours as needed for wheezing or shortness of breath. Patient not taking: Reported on 08/31/2022    [provider]   Review of Systems  Constitutional:  Positive for fatigue. Negative for appetite change.  HENT:  Positive for congestion. Negative for postnasal drip and sore throat.   Eyes: Negative.   Respiratory:  Positive for cough. Negative for chest tightness, shortness of breath and wheezing.   Cardiovascular:  Positive for palpitations (occasional) and leg swelling. Negative for chest pain.  Gastrointestinal:  Positive for constipation. Negative for abdominal distention and abdominal pain.  Endocrine: Negative.   Genitourinary: Negative.   Musculoskeletal:  Positive for arthralgias (left arm; left hip) and back pain (when laying in the bed too long).  Skin: Negative.        Severe lymphedema   Allergic/Immunologic: Negative.   Neurological:  Positive for dizziness (at times). Negative for light-headedness.  Hematological:  Negative for adenopathy. Does not bruise/bleed easily.  Psychiatric/Behavioral:  Negative for dysphoric mood and sleep disturbance (sleeping well in lift chair). The patient is not nervous/anxious.    Vitals:   09/27/22 0844  BP: 121/85  Pulse: 95  Resp: 14  SpO2: 97%  Weight: (!) 356 lb 8 oz (161.7 kg)   Wt Readings from Last 3 Encounters:  09/27/22 (!) 356 lb 8 oz (161.7 kg)  08/31/22 (!) 335 lb (152 kg)  07/23/22 (!) 389 lb (176.4 kg)   Lab Results  Component Value Date   CREATININE  1.81 (H) 08/31/2022   CREATININE 1.28 (H) 08/03/2022   CREATININE 1.32 (H) 07/23/2022   Physical Exam Vitals and nursing note reviewed.  Constitutional:      Appearance: She is well-developed.  HENT:     Head: Normocephalic and atraumatic.  Cardiovascular:     Rate and Rhythm: Normal rate and regular rhythm.  Pulmonary:     Effort: Pulmonary effort is normal. No accessory muscle usage.     Breath sounds: No wheezing, rhonchi or rales.  Abdominal:     Palpations: Abdomen is soft.     Tenderness: There is no abdominal tenderness.  Musculoskeletal:     Cervical back: Normal range of motion and neck supple.     Right lower leg: Edema present.     Left lower leg: Edema present.     Comments: Severe lymphedema although legs are softer  Skin:    General: Skin is warm and dry.  Neurological:     General: No focal deficit present.  Mental Status: She is alert and oriented to person, place, and time.  Psychiatric:        Mood and Affect: Mood is not depressed.        Behavior: Behavior normal.   Assessment & Plan:  1: Chronic heart failure with preserved ejection fraction without structural changes- - NYHA class II - euvolemic today - has attempted  to weigh daily with standing on 2 scales at home but it fluctuates too much - weight up 21 pounds from last visit here 1 month ago; had previously lost a significant amount - echo 11/01/21: EF of 60-65%.  - admits to some dietary indiscretion with high sodium foods/ too much fluid intake - reviewed the importance of keeping her sodium 2000mg  as well as keeping daily fluid intake to 60-64 ounces/ day - facial/mouth swelling with ACEi - farxiga 10mg   - spironolactone 12.5mg  QD - bumex 2mg  QD for the last week - metolazone 2.5mg  twice weekly - potassium 34meq daily - saw cardiology Margarito Courser) 03/19/22 - BNP 10/31/21 was 123.9 - PharmD reconciled medications with the patient  2: HTN with stage 3 CKD- - BP 121/85 - saw PCP Daryel Gerald) @  [facility]  - saw nephrology Holley Raring) 09/06/22  - BMP 09/06/22 reviewed and showed sodium 139, potassium 3.7, creatinine 1.62 and GFR 35  3: Lymphedema- - saw wound center 08/23/22; returns today  4: Sleep apnea- - wearing CPAP nightly  5: Snuff use- - says that she's been using snuff for a long time - discussed health coaching but she declines at this time - flyer provided and she will let us know if she changes her mind (this may also be helpful for her diet)   Return in 2 months, sooner if needed.

## 2022-09-27 ENCOUNTER — Encounter: Payer: Self-pay | Admitting: Family

## 2022-09-27 ENCOUNTER — Encounter: Payer: Medicare Other | Attending: Physician Assistant | Admitting: Physician Assistant

## 2022-09-27 ENCOUNTER — Encounter: Payer: Self-pay | Admitting: Pharmacist

## 2022-09-27 ENCOUNTER — Ambulatory Visit: Payer: Medicare HMO | Attending: Family | Admitting: Family

## 2022-09-27 VITALS — BP 121/85 | HR 95 | Resp 14 | Wt 356.5 lb

## 2022-09-27 DIAGNOSIS — I130 Hypertensive heart and chronic kidney disease with heart failure and stage 1 through stage 4 chronic kidney disease, or unspecified chronic kidney disease: Secondary | ICD-10-CM | POA: Diagnosis not present

## 2022-09-27 DIAGNOSIS — E876 Hypokalemia: Secondary | ICD-10-CM | POA: Diagnosis not present

## 2022-09-27 DIAGNOSIS — Z7984 Long term (current) use of oral hypoglycemic drugs: Secondary | ICD-10-CM | POA: Insufficient documentation

## 2022-09-27 DIAGNOSIS — I5032 Chronic diastolic (congestive) heart failure: Secondary | ICD-10-CM | POA: Diagnosis present

## 2022-09-27 DIAGNOSIS — E669 Obesity, unspecified: Secondary | ICD-10-CM | POA: Insufficient documentation

## 2022-09-27 DIAGNOSIS — J45909 Unspecified asthma, uncomplicated: Secondary | ICD-10-CM | POA: Insufficient documentation

## 2022-09-27 DIAGNOSIS — G4733 Obstructive sleep apnea (adult) (pediatric): Secondary | ICD-10-CM | POA: Diagnosis not present

## 2022-09-27 DIAGNOSIS — Z872 Personal history of diseases of the skin and subcutaneous tissue: Secondary | ICD-10-CM | POA: Insufficient documentation

## 2022-09-27 DIAGNOSIS — G8929 Other chronic pain: Secondary | ICD-10-CM | POA: Insufficient documentation

## 2022-09-27 DIAGNOSIS — R220 Localized swelling, mass and lump, head: Secondary | ICD-10-CM | POA: Diagnosis not present

## 2022-09-27 DIAGNOSIS — Z79899 Other long term (current) drug therapy: Secondary | ICD-10-CM | POA: Insufficient documentation

## 2022-09-27 DIAGNOSIS — F17220 Nicotine dependence, chewing tobacco, uncomplicated: Secondary | ICD-10-CM | POA: Diagnosis not present

## 2022-09-27 DIAGNOSIS — R002 Palpitations: Secondary | ICD-10-CM | POA: Diagnosis not present

## 2022-09-27 DIAGNOSIS — Z6841 Body Mass Index (BMI) 40.0 and over, adult: Secondary | ICD-10-CM | POA: Diagnosis not present

## 2022-09-27 DIAGNOSIS — R5383 Other fatigue: Secondary | ICD-10-CM | POA: Insufficient documentation

## 2022-09-27 DIAGNOSIS — I10 Essential (primary) hypertension: Secondary | ICD-10-CM

## 2022-09-27 DIAGNOSIS — I890 Lymphedema, not elsewhere classified: Secondary | ICD-10-CM | POA: Insufficient documentation

## 2022-09-27 DIAGNOSIS — Z720 Tobacco use: Secondary | ICD-10-CM

## 2022-09-27 DIAGNOSIS — Q820 Hereditary lymphedema: Secondary | ICD-10-CM | POA: Diagnosis present

## 2022-09-27 DIAGNOSIS — M1990 Unspecified osteoarthritis, unspecified site: Secondary | ICD-10-CM | POA: Diagnosis not present

## 2022-09-27 DIAGNOSIS — G4730 Sleep apnea, unspecified: Secondary | ICD-10-CM | POA: Diagnosis not present

## 2022-09-27 DIAGNOSIS — N183 Chronic kidney disease, stage 3 unspecified: Secondary | ICD-10-CM | POA: Diagnosis not present

## 2022-09-28 NOTE — Progress Notes (Addendum)
[name], [name] ([id number]) [id number] Page 1 of 14 Visit Report for 09/27/2022 Chief Complaint Document Details Patient Name: Date of Service: [name], [name] 09/27/2022 10:00 A M Medical Record Number: [id number] Patient Account Number: [id number] Date of Birth/Sex: Treating RN: [birth year] (67 y.o. [place] Primary Care Provider: Ranae Plumber Other Clinician: Massie Kluver Referring Provider: Treating Provider/Extender: Skeet Simmer in Treatment: 60 Information Obtained from: Patient Chief Complaint Bilateral LE lymphedema with Left LE Ulcers Electronic Signature(s) Signed: 09/27/2022 9:57:38 AM By: Worthy Keeler PA-C Entered By: Worthy Keeler on 09/27/2022 09:57:38 -------------------------------------------------------------------------------- HPI Details Patient Name: Date of Service: [name], [name] 09/27/2022 10:00 A M Medical Record Number: [id number] Patient Account Number: [id number] Date of Birth/Sex: Treating RN: Sep 25, 1955 (67 y.o. [place] Primary Care Provider: Ranae Plumber Other Clinician: Massie Kluver Referring Provider: Treating Provider/Extender: Skeet Simmer in Treatment: 5 History of Present Illness HPI Description: The patient is a 67 year old female with history of hypertension and a long-standing history of bilateral lower extremity lymphedema (first presented on 4/2) . She has had open ulcers in the past which have always responded to compression therapy. She had briefly been to a lymphedema clinic in the past which helped her at the time. this time around she stopped treatment of her lymphedema pumps approximately 2 weeks ago because of some pain in the knees and then noticed the right leg getting worse. She was seen by her PCP who put her on clindamycin 4 times a day 2 days ago. The patient has seen [facility] and Dr. Delana Meyer had seen her last year where a vascular  study including venous and arterial duplex studies were within normal limits. he had recommended compression stockings and lymphedema pumps and the patient has been using this in about 2 weeks ago. She is known to be diabetic but in the past few time she's gone to her primary care doctor her hemoglobin A1c has been normal. 02/11/2015 - after her last visit she took my advice and went to the ER regarding the progressive cellulitis of her right lower extremity and she was admitted between July 17 and 22nd. She received IV antibiotics and then was sent home on a course of steroid-induced and oral antibiotics. She has improved much since then. 02/17/2015 -- she has been doing fine and the weeping of her legs has remarkably gone down. She has no fresh issues. [name], [name] ([id number]) [id number] Page 2 of 14 01/15/18 This patient was given this clinic before most recently in 2016 seen by Dr. Con Memos. She has massive bilateral lymphedema and over the last 2 months this had weeping edema out of the left leg. She has compression pumps but her compliance with these has been minimal. She has [facility] they've been using TCA/ABDs/kerlix under an Ace wrap.she has had recent problems with cellulitis. She was apparently seen in the ER and 12/23/17 and given clindamycin. She was then followed by her primary doctor and given doxycycline and Keflex. The pain seems to have settled down. In April 2018 the patient had arterial studies done at [facility]. This showed triphasic waveforms throughout the right leg and mostly triphasic waveforms on the left except for monophasic at the posterior tibial artery distally. She was not felt to have evidence of right lower extremity arterial stenosis or significant problems on the left side. 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 She also had a right  lower extremity venous Doppler in  January 2018 this was limited by the patient's body habitus and lymphedema. Most of the proximal veins were not visualized The patient presents with an area of denuded skin on the anterior medial part of the left calf. There is weeping edema fluid here. 01/22/18; the patient has somewhat better edema control using her compression pumps twice a day and as a result she has much better epithelialization on the left anterior calf area. Only a small open area remains. 01/29/18; the patient has been compliant with her compression pumps. Both the areas on her calf that healed. The remaining area on the left anterior leg is fully epithelialized Readmission: 02/20/2019 upon evaluation today patient presents for reevaluation due to issues that she is having with the bilateral lower extremities. She actually has wounds open on both legs. On the right she has an area in the crease of her leg on the right around the knee region which is actually draining quite a bit and actually has some fungal type appearance to it. She has been on nystatin powder that seems to have helped to some degree. In regard to the left lower extremity this is actually in the lower portion of her leg closer to the ankle and again is continuing to drain as well unfortunately. There does not appear to be any signs of active infection at this time which is good news. No fevers, chills, nausea, vomiting, or diarrhea. She tells me that since she was seen last year she is actually been doing quite well for the most part with regard to her lower extremities. Unfortunately she now is experiencing a little bit more drainage at this time. She is concerned about getting this under control so that it does not get significantly worse. 02/27/2019 on evaluation today patient appears to be doing somewhat better in regard to her bilateral lower extremity wounds. She has been tolerating the dressing changes without complication. Fortunately there is no signs of  active infection at this point. No fevers, chills, nausea, vomiting, or diarrhea. She did get her dressing supplies which is excellent news she was extremely excited to get these. She also got paperwork from [employer] for their financial assistance program where they may be able to help her out in the future if needed with supplies at discounted prices. 03/06/2019 on evaluation today patient appears to be doing a little worse with regard to both areas of weeping on her bilateral lower extremities. This is around the right medial knee and just above the left ankle. With that being said she is unfortunately not doing as well as I would like to see. I feel like she may need to potentially go see someone at the lymphedema clinic as the wraps that she needs or even beyond what we can do here at the wound care center. She really does not have wounds she just has open areas of weeping that are causing some difficulty for her. Subsequently because of this and the moisture I am concerned about the potential for infection I am going to likely give her a prophylactic antibiotic today, Keflex, just to be on the safe side. Nonetheless again there is no obvious signs of active infection at this time. 03/13/2019 on evaluation today patient appears to be doing well with regard to her bilateral lower extremities where she has been weeping compared to even last week's evaluation. I see some areas of new skin growth which is excellent and overall I am very pleased with  how things seem to be progressing. No fevers, chills, nausea, vomiting, or diarrhea. 03/20/2019 on evaluation today patient unfortunately is continuing to have issues with significant edema of the left lower extremity. Her right side seems to be doing much better. Unfortunately her left side is showing increased weeping of the lower portion of her leg. This is quite unfortunate obviously we were hoping to get her into the lymphedema clinic they really do not seem  to when I see her how if she is draining. Despite the fact this is really not wound related but more lymphedema weeping related. Nonetheless I do not know that this can be helpful for her to even go for that appointment since again I am not sure there is much that they would actually do at this point. We may need to try a 4 layer compression wrap as best we can on her leg. She is on the Augmentin currently although I am still concerned about whether or not there could be potentially something going on infection wise I would obtain a culture though I understand is not the best being that is a surface culture I just 1 to make sure I do not seem to be missing anything. 03/27/2019 on evaluation today patient appears to be doing much better in regard to the left lower extremity compared to last week. Last week she had tremendous weeping which I think was subsequent to infection now she seems to be doing much better and very pleased. This is not completely healed but there is a lot of new skin growth and it has dried out quite a bit. Overall I think that we are doing well with how things are moving along at this time. No fevers, chills, nausea, vomiting, or diarrhea. 04/03/2019 evaluation today patient appears to be doing a little worse this week compared to last time I saw her. I think this may be due to the fact that she is having issues with not being able to sleep in her bed at least not until last night. She is therefore been in a lift chair and subsequently has also had issues with not been able to use her pumps since she could not get in bed. With that being said the patient overall seems to be doing okay I do think I may want extend the antibiotic for a little bit longer at least until we can see if her edema and her weeping gets better and if it is then obviously I can always discontinue the antibiotics as of next week however I want her to continue to have it over the next week. 04/10/2019 on  evaluation today patient unfortunately is still doing poorly with regard to her left lower extremity. Her right is all things considering doing fairly well. On the left however she continues to have spreading of the area of infection and weeping which appears to be even a larger surface area than noted last week. She did have a positive culture for Pseudomonas in particular which seems to have been of concern she still has green/yellow discharge consistent with Pseudomonas and subsequently a tremendous amount of it. This has me obviously still concerned about the infection not really clearing up despite the fact that on culture it appears the Cipro should have been a good option for treating this. I think she may at this point need IV antibiotics since things are not doing better I do not want to get worse and cause sepsis. She is in agreement with the  plan and believes as well that she likely does need to go to the hospital for IV vancomycin. Or something of the like depending on what the recommendation is from the ER. 04/17/2019 on evaluation today patient appears to be doing excellent in regard to her lower extremity on the left. She was in the hospital for several days from when I sent her last we saw her until just this past Tuesday. Fortunately her drainage is significantly improved and in fact is mostly clear. There is just a couple small areas that may still drain a little bit she states that the [facility] they prescribed for her at discharge she went picked up from pharmacy and got home but has not been able to find it since. She is looked everywhere. She is wondering if I will replace that for her today I will be more than happy to do that. 05/01/2019 on evaluation today patient actually appears to be doing quite well with regard to her lower extremities. She occasionally is having areas that will leak and then heal up mainly when a piece of the fibrotic skin pops off but fortunately she is not  having any signs of active infection at this time. Overall she also really does not have any obvious weeping at this time. I do believe however she really needs some compression wraps and I think this may be a good time to get her back to the lymphedema clinic. 05/11/2019 on evaluation today patient actually appears to be doing quite well with regard to her bilateral lower extremities. She occasionally will have a small area that we per another but in general seems to be completely healed which is great news. Overall very pleased with how everything seems to be progressing. She does have her appointment with lymphedema clinic on November 18. 05/25/2019 on evaluation today patient appears to be doing well with regard to her left lower extremity. I am very pleased in this regard. In regard to her right leg this actually did start draining more I think it is mainly due to the fact that her leg is more swollen. I am not seeing any obvious signs of infection at this time although that is definitely something were obviously acutely aware of simply due to the fact that she had an issue not too far back with exactly this issue. Nonetheless I do feel like that lymphedema clinic would still be beneficial for her. I explained obviously if they are not able to do anything treatment wise on the right leg we could at least have them treat her left leg and then proceed from there. The patient is really in agreement with that plan. If they are able to do both as the drainage slows down that I would be happy to let them handle both. [name], [name] ([id number]) [id number] Page 3 of 14 06/01/2019 on evaluation today patient unfortunately appears to be doing worse with regard to her right lower extremity. The left lower extremity is still maintaining at this point. Unfortunately she has been having significantly increased pain over the past several days and has been experiencing as  well increased swelling of the right lower extremity. I really do not know that I am seeing anything that appears to be obvious for infection at this point to be peripherally honest. With that being said the patient does seem to be having much more swelling that she is even experienced in the past and coupled with increased pain in her hip as well I am  concerned that again she could potentially have a DVT although I am not 100% sure of this. I think it something that may need to be checked out. We discussed the possibility of sending her for a DVT study through the hospital but unfortunately transportation is an issue if she does have a DVT I do not want her to wait days to be able to get in for that test however if she has this scheduled as an outpatient that is as fast that she will be able to get the test scheduled for transportation purposes. That will also fall on Thanksgiving so subsequently she did actually be looking at either Friday or even next week before we would know anything back from this. That is much too long in my opinion. Subsequent to the amount of discomfort she is experiencing the patient is actually okay with going to the ER for evaluation today. 06/12/2019 on evaluation today patient actually appears to be doing significantly better compared to last time I saw her. Following when I last saw her she was actually in the hospital from that Monday until the following Sunday almost 1 full week. She actually was placed on Keflex in the hospital following the time for her to be discharged and Dr. Steva Ready has recommended 2 times a day dosing of the Keflex for the next year in order to help with more prophylactic/preventative measures with regard to her developing cellulitis. Overall I think this sounds like an excellent plan. The patient unfortunately is good to have trouble being treated at lymphedema clinic due to the fact that she really cannot get up on the bed that they have  there. They also state that they cannot manage her as long as she has anything draining at this point. Obviously that is somewhat unfortunate as she does need help with edema control but nonetheless we will have to do what we can for her outside of it sounds like the lymphedema clinic scenario at this point. 06/19/2019 on evaluation today patient appears to be doing fairly well with regard to her bilateral lower extremities. She is not nearly as swollen and shows no signs of infection at this point. There is no evidence of cellulitis whatsoever. She also has no open wounds or draining at this point which is also good news. No fever chills noted. She seems to be in very good spirits and in fact appears to be doing quite well. READMISSION 11/27/2019 This is a 67 year old woman that we have had in this clinic several times before including 2015, 16 and 19 and then most recently from 03/20/2019 through 06/19/2019 with bilateral lower extremity lymphedema. She has had previous arterial and reflux studies done years ago which were not all that remarkable. 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 She does have a positive family history and she had large legs starting may be in her 62s. She was recently in hospital from 10/20/2019 through 10/28/2019 with right leg cellulitis. She was given Ancef and clindamycin and then Zosyn when a culture showed Pseudomonas. At that time there was purulent drainage. She was followed by infectious disease Dr Steva Ready. The patient is now back at home. She has noted increased swelling in the right and no drainage in her right leg mostly on the posterior medial aspect in the calf area. She has not had pain or fever. She has literally been improved lysing above dressings because her at the area of this is far  too large for standard compression. She has been wrapping the areas with sheets to resorptive pads. She is found these  helped somewhat. She does have an appointment with the lymphedema clinic in [place] in late June. Past medical history includes bilateral lymphedema, hypertension, obstructive sleep apnea with CPAP. Recent hospitalization with apparently Pseudomonas cellulitis of the right lower leg 12/15/2019 upon evaluation today patient appears to be doing a little bit worse in regard to her right lower extremity. Unfortunately she is having more weeping down in the lower portion of her leg. Fortunately there is no signs of active infection at this time. No fever chills noted. The patient states she is not having increased pain except for when she attempted to use the lymphedema pumps unfortunately she states that she did have pain when she did this. Otherwise we been using absorptive dressings of one type or another she is using diapers at home and then subsequently Ace wraps. In regard to the barrier cream we have discussed the possibility of derma cloud which she would like to try I do not have a problem with that. 12/22/2019 upon evaluation today patient actually appears to be doing better in regard to her leg ulcers at this point. Fortunately there does not appear to be any signs of active infection which is great news and I am extremely pleased with where things are progressing at this time. There is no sign of active infection currently. The patient is very pleased to see things doing so well. 12/29/2019 upon evaluation today patient appears to be doing a little bit better in regard to her weeping in general over her lower extremities. She does have some signs of mild erythema little bit more than what I noted last week or rather last visit. Nonetheless I think that my threshold for switching her antibiotics from Keflex to something else is very low at this point considering that she has had such severe infections in the past that seem to come almost out of nowhere. There is a little erythema and warmth  noted of the lower portion of her leg compared to the upper which also makes me want to go ahead and address things more rapidly at this point. Likely I would switch out the Keflex for something like Levaquin ideally. 7/16; patient with severe bilateral lymphedema. She has superficial wounds albeit almost circumferential now on the left lateral lower leg. This may be new from last time. Small area on the right anterior lower leg and then another area on the right medial lower leg and of pannus fold. She has been using various absorptive garments. She states she is using her compression pumps once a day occasionally twice. Culture from her last visit here was negative 01/29/2020 on evaluation today patient appears to be doing excellent at this point in regard to her legs with regard to infection I see no signs of active infection at this point. She still does have unfortunately areas of weeping this is minimal on the right now her left is actually significantly worse although I do not think it is as bad as last week with Dr. Dellia Nims saw her. She has been trying to pump and elevate her legs is much as possible. She has previously been on the Keflex and in the past for prevention that seems to do fairly well and likely can extend that today. 02/04/2020 on evaluation today patient appears to be doing better in regard to her legs bilaterally. Fortunately there is no signs of  active infection at this time which is great news and overall she has less weeping on the left compared to the right and there is several spots where she is pretty much sealed up with no draining regions. Overall very pleased in this regard. 02/19/2020 on evaluation today patient appears to be doing very well in regard to her wounds currently. Fortunately there is no evidence of active infection overall very pleased with where things stand. She is significantly improved in regard to her edema I am extremely pleased in this regard she tells  me that the popping no longer hurts and in fact she actually looks forward to it. 03/04/2020 on evaluation today patient appears to be doing excellent in regard to her lower extremities. Fortunately there is no signs of active infection at this time. No fevers, chills, nausea, vomiting, or diarrhea. 03/25/2020 on evaluation today patient appears to be doing a little bit more poorly in regard to her legs at this point. She tells me that she is still continue to have issues with drainage and this has been a little bit worse she was getting ready to start taking the Keflex again but wanted to see me first. Fortunately there is no signs of active infection at this time. No fevers, chills, nausea, vomiting, or diarrhea. 04/15/2020 upon evaluation today patient appears to be doing somewhat poorly in regard to her right leg. She tells me she has been having more pain she has been taking the Keflex that was previously prescribed unfortunately that just does not seem to help with this. She was hoping that the pain on her right was actually coming from the fact that she was having issues with her wrap having gotten caught in her recliner. With that being said she tells me that she knew something was not right. Currently her right leg is warm to touch along with being erythematous all the way up to around at least mid thigh as far as I can see. The left leg does not appear to be doing that badly though there is increased weeping around the ankle region. 05/06/2020 on evaluation today patient appears to be doing much better than last time I saw her. She did go to the hospital where she was admitted for 2 days and treated with antibiotic therapy. She was discharged with antibiotics as well and has done extremely well. I am extremely pleased with where things stand today. There is no signs of active infection at this time which is great news. 05/27/2020 upon evaluation today patient appears to be doing well with  regard to her lower extremities bilaterally. She has just a very tiny area on the right leg which is opening on the left leg she is significantly improved though she still has several areas that do appear to be open this is minimal compared to what is been in the past. In general I am extremely pleased with where things stand today. The patient does tell me she is not been using her pumps quite as much as PAYTN, ARMENTA ([id number]) [id number] Page 4 of 14 she should be. I do believe that is 1 area she can definitely work on. She has had a lot going on including a Covid exposure and apparently also a outbreak of likely shingles. 06/24/2020 upon evaluation today patient appears to be doing well with regard to her legs in general although the left leg unfortunately is showing some signs of erythema she does have a little bit of increased weeping and  to be honest I am concerned about infection here. I discussed that with her today and I think that we may need to address this sooner rather than later she has been taking Keflex she is not really certain that is been making a big improvement however. No fevers, chills, nausea, vomiting, or diarrhea. 06/30/2020 upon evaluation today patient's legs actually seem to be doing better in my opinion as compared to where they were last week. Fortunately there does not appear to be any signs of active infection. Her culture showed multiple organisms nothing predominate. With that being said the Levaquin seems to have done well I think she has improved since I last saw her as well. 07/14/2020 upon evaluation today patient appears to be doing actually better in regard to her lower extremities in my opinion. She has been tolerating the dressing changes without complication. Fortunately there is no signs of active infection at this time. 08/04/2020 upon evaluation today patient appears to be doing excellent in regard to her leg ulcers.  Fortunately she has very little that is open at this point. This is great news. In fact I think that the Gold[employer] medicated powder has been excellent for her. It seems to have done the trick where we had tried several other things without as much success. Fortunately there is no evidence of active infection at this time. No fevers, chills, nausea, vomiting, or diarrhea. 09/01/2020 upon evaluation today patient appears to be doing a little bit more poorly than the last time I saw her. She tells me right now that she has been having a lot of drainage compared to where things were previous which has unfortunately led to more irritation as well. She is concerned that this is leading to infection. Again based on what I am seeing today as well I am also concerned of the same to be honest. 09/15/2020 upon evaluation today patient appears to be doing well with regard to her legs compared to what I saw previous. Fortunately there does not appear to be any evidence of active infection at this time which is great news. At least not as badly as what it was previous. With that being said I do believe that the patient does need to have an extension of the antibiotics. This I believe will actually help her more in the way of making sure this stays under control and does not worsen. That was the Bactrim DS. Readmission: 08/01/2021 upon evaluation today patient appears to be doing actually pretty well all things considered. Is actually been almost a year since have seen her last March. Fortunately I do not see any evidence of active infection locally nor systemically at this point. She does have a couple open areas on the left leg the right leg appears to be doing quite well. In general she has been in the hospital 3 times since I saw her a year ago all 4 cellulitis type issues except for the last 1 which was actually more related to congestive heart failure for that reason she is not using her lymphedema pumps at this  point and that is probably the best idea. 08/08/2021 upon evaluation today patient appears to be doing well with regard to her legs all things considered her left leg may be a little bit more swollen based on what I see today. Fortunately I do not see any signs of active infection locally nor systemically at this time which is great news. No fevers, chills, nausea, vomiting, or diarrhea. 08/15/2021 upon  evaluation today patient actually appears to be doing excellent in regard to her wounds. She has been tolerating the dressing changes without complication. Fortunately I see no evidence of infection currently which is great news. No fevers, chills, nausea, vomiting, or diarrhea. 08/29/2021 upon evaluation patient appears to be doing excellent she in fact is almost completely healed. I am actually very pleased with where we stand today and I think that she is making wonderful progress she just has a small area of weeping on the right lateral leg everything else is pretty much completely healed which is also. 09/05/2021 upon evaluation today patient appears to be doing well with regard to her legs. She does have a small area of weeping on the right leg and there is a small area on the left leg as well. It is potentially something that may need to be addressed in the future more specifically but right now I think that there may have just been a little drainage here on the left. With all that being said I think that this still does not appear to be infected and overall I feel like she is doing quite well. That something we always have to keep a very close eye on as she can change very rapidly from okay to not okay. 09/12/2021 upon evaluation today patient appears to be doing a little bit worse in regard to her leg and actually somewhat concerned about the possibility of infection here. I discussed that with the patient. For that reason I am going to go ahead and see about getting her set up for a repeat  prescription for the Bactrim. She is in agreement with that plan. 3/14; patient presents for follow-up. She has been using silver alginate with dressing changes. She is still taking Bactrim prescribed at last clinic visit. She has no issues or complaints today. She has lymphedema pumps however does not use these. 09/26/2020 upon evaluation today patient appears to actually be doing well in regard to her right leg I am pleased in that regard. Unfortunately she has new areas on her left leg that are open at this point that I do need to be addressed. I am also concerned about the warmth around one of the wounds on the more anterior side. I think this is something that we may need to culture today potentially requiring antibiotics going forward. 10/03/2021 upon evaluation today patient unfortunately is not doing nearly as well as she was even last week with her wounds. She is having some issues here with increased cellulitis of the left lower extremity unfortunately. I did prescribe Augmentin for her eczema prescribed last week unfortunately she never actually received this prescription. The pharmacy stated they never got it. We double checked with them today they still did not receive it. I am not sure what happened in that regard. With that being said the patient is in good spirits today she does not appear to be as sick as where she normally is when the cellulitis gets significantly worse as it has in the past. Nonetheless the leg is much more red not just around the ankle where I was seeing at last week on Tuesday but rather now this is going all the way up to almost her knee and is definitely hot to touch compared to the right leg. Nonetheless I feel like she does need to go to the ER for likely IV antibiotics. 10-10-2021 upon evaluation today patient appears to be doing well with regard to her wound. She  has been tolerating the dressing changes that appears to be doing much better. After I saw her last  week she did go to the ER they admitted her to the hospital and gave her IV antibiotics she fortunately is doing significantly better. Overall I am extremely pleased with where things stand today. 10-17-2021 upon evaluation today patient appears to be doing well with regard to her wound. In fact this is looking better and her leg is looking better but she still does have some areas here of erythema. We will continue to address this as soon as possible in my opinion. I do believe she may require some debridement and what appears to be a more defined wound on her left leg at this point. 10-24-2021 upon evaluation patient is definitely showing signs of improvement which is great news. I do not see any evidence of active infection locally or systemically which is great news as well. No fevers, chills, nausea, vomiting, or diarrhea. 10-31-2021. Upon evaluation today patient's leg actually feels better from an infection and wound care perspective. I am actually very pleased in this regard. Unfortunately the biggest issue that I see at this time is that the patient is having a significant issue with her breathing. I did put her on the pulse ox machine to check her oxygen saturation and it was pretty much at 100% which is good but she tells me that when she is walking this is much more difficult for her. She also tells me that when she is trying to bend over to perform her dressing changes she is so short of breath due to the swelling and fluid in her abdominal area that she is just not been able to do this. She is tearful and very concerned today to be honest. I am truly sorry to see her this way I know she is really having a hard time at the moment. 11-14-2021 upon evaluation patient actually appears to be doing significantly better compared to when I last saw her. She was actually admitted to the hospital I believe this for 6 days total after I sent her on 10-31-2021. Subsequently they actually pulled off greater  than 50 pounds of fluid she tells me she feels significantly better her legs are not as tight she actually has some play in the tissue and it is obvious that she is doing much better just from a mobility JAMEI, DAUN ([id number]) [id number] Page 5 of 14 standpoint as well as a breathing standpoint. Overall I am extremely happy for her and how she is doing the leg also looks much better on the left. 11-21-2021 upon evaluation today patient appears to be doing well although it does appear that she is going require some sharp debridement the wound is very dry this is the opposite of what its been she was having so much weeping that it was staying extremely wet. Nonetheless we do need to see about going ahead and debriding the wound today. 11-28-2021 upon evaluation today patient appears to be doing well with regard to her wound I definitely see signs of things continuing to improve which is great news. I do not see any evidence of active infection locally or systemically also great news. 12-05-2021 upon evaluation today patient appears to be doing well with regard to her wound. She has been tolerating the dressing changes. Fortunately there does not appear to be any signs of active infection locally or systemically at this time. No fevers, chills, nausea, vomiting, or diarrhea. 12-12-2021  upon evaluation patient appears to be doing well with regard to her wound this is actually measuring smaller and looking much better. Fortunately I do not see any signs of active infection locally or systemically at this time which is excellent news. 6/13; small wound area in the folds of her lymphedema just above the left ankle. Fortunately the area looks quite good. She has been using silver alginate ABDs and an Ace wrap which she is able to change her self. She is not using her compression pumps out of fear that this could contribute to heart failure. 12-26-2021 upon evaluation patient's  wounds are actually showing signs of excellent improvement. I am very pleased with where things stand. Overall I think that she has been making excellent progress and I think we are very close to complete resolution which is great news. 01-11-2022 upon evaluation today patient appears to be doing well currently in regard to her legs in general although on the left leg she does have 1 area that still irritated and inflamed on the anterior portion of her leg. Fortunately I do not see any signs of systemic infection locally there might be some infection going on here however which is my biggest concern. Fortunately I do not see any evidence though of this spreading to any other location. 01-16-2022 upon evaluation today patient has actually showing signs of excellent improvement. Fortunately I do not see any evidence of infection locally or systemically which is great news and overall I am very pleased with where we stand at this point. 01-25-2022 upon evaluation today patient actually appears to be doing decently well in regard to her legs. She has been tolerating the dressing changes without complication. Fortunately there does not appear to be any evidence of active infection locally or systemically which is great news and overall I am extremely pleased with where we stand at this point. 02-13-2022 upon evaluation today patient appears to be doing well currently in regard to her wound which is actually showing signs of significant improvement. Fortunately I do not see any evidence of active infection locally or systemically at this time. No fevers, chills, nausea, vomiting, or diarrhea. 02-20-2022 upon evaluation today patient appears to be doing well currently in regard to her wound. She has been tolerating the dressing changes without complication. Fortunately there does not appear to be any signs of active infection locally or systemically at this time. No fevers, chills, nausea, vomiting,  or diarrhea. 02-27-2022 upon evaluation today patient appears to be doing excellent in regard to her wounds on the leg. She has been tolerating the dressing changes without complication this is very close to complete resolution. Fortunately I do not see any signs of infection locally or systemically at this time which is great news. No fevers, chills, nausea, vomiting, or diarrhea. 03-06-2022 upon evaluation today patient appears to be doing well currently in regard to her wound in general. She has been tolerating the dressing changes without complication. Fortunately I do not see any evidence of active infection locally or systemically at this time which is great news. I am concerned a little bit however about not really her wounds but the second toe right foot where there appears to be some cellulitis after she dropped a frying pan on this about 2 weeks ago. She tells me its been itching and burning and I do think that it is possible she may have an infection based on what we are seeing currently. 03-20-2022 upon evaluation today patient actually appears  to be doing much better at this point. Fortunately there does not appear to be any signs of active infection locally or systemically at this time which is great news. No fevers, chills, nausea, vomiting, or diarrhea. 03-27-2022 upon evaluation today patient appears to be doing somewhat poorly in regard to her legs compared to previous. Her right leg has a couple areas that are open and she is very warm and painful to touch around the lateral portion of the ankle area. Left leg is showing signs of little bit more drainage and weeping as well I think that this is probably due to the fact that she is swelling a lot more that she has been in the past. Fortunately I do not see any signs of active infection systemically though locally there is definitely some issues here. 04-03-2022 upon evaluation today patient appears to be doing poorly in regard to her  wounds on the legs unfortunately. She also seems to have more significant cellulitis that has ensued. Unfortunately she has been on doxycycline initially it seemed to be helping but as of this week that is no longer the case. It started getting worse last week and with the reinitiation of the doxycycline nothing has improved. Often when she gets like this IV antibiotics are necessary. 04-19-2022 upon evaluation today patient appears to be doing well currently in regard to her legs from an infection standpoint. She did get IV vancomycin in the hospital and states that her legs did get better when she left she was no longer weeping but she had been in the bed for 4 days straight. With that being said since coming home her legs have begun to weep again the original wound that we were dealing with has healed she does have some weeping over both lower extremities however which is still of concern. 05-03-2022 upon evaluation today patient appears to be doing well currently in regard to her left leg which I feel like is a little bit better unfortunately the right leg is still draining significantly. Fortunately I do not see any signs of infection locally or systemically at this time. No fevers, chills, nausea, vomiting, or diarrhea. 05-10-2022 unfortunately patient actually showed signs of increasing erythema today. I believe that the cellulitis never completely resolved from when she was in the hospital last. Again she has been on doxycycline previously by myself I gave her Keflex more recently as an ongoing prescription to try to prevent infection but again I do not think this is strong enough to be able to get this under control. I really feel like that she is probably can need IV antibiotics and a good period of time in the hospital to be able to keep her legs elevated and not having to get around to do a whole lot in order to allow this to resolve. The patient voiced understanding. 05-24-2022 upon  evaluation today patient appears to be doing excellent in regard to her legs. She has been tolerating the dressing changes without complication. Fortunately there does not appear to be any signs of infection at this time which is great news she has been on cefadroxil since she was discharged from the hospital. The day that we sent her she actually tells me that she ended up in septic shock that day and coded. When she first showed up they did not notice it on the lab work but said by the end of the day she had already undergone this and they had had to address the issue  obviously when she coded. She tells me that likely she would not be here today if she had not gone to the hospital that day. I explained to her that I do believe that was a God thing and it was just the perfect timing that he had for her at that time. 06-14-2022 upon evaluation today patient appears to be doing well currently in regard to her legs. In general I think she is making good progress and I do not see any signs of infection at this time which is great news. No fevers, chills, nausea, vomiting, or diarrhea. 06-28-2022 upon evaluation today patient appears to be doing excellent in regard to her legs. She is actually showing signs of excellent improvement I do not see any evidence of infection locally nor systemically at this time. No fevers, chills, nausea, vomiting, or diarrhea. 07-26-2022 upon eval evaluation today patient appears to be doing well currently in regard to her wound. She has been tolerating the dressing changes without complication. Fortunately I do not see any major issues here she has a couple areas of weeping mainly on the right leg the left leg seems to be doing pretty MARRION, BOYD ([id number]) [id number] Page 6 of 14 well. 08-23-2021 upon evaluation today patient appears to be doing excellent in regard to her legs. These are showing signs of wonderful improvement I am  actually very pleased with where we stand I think she is doing the best that I have ever seen her. Fortunately I do not see any evidence of infection locally or systemically which is great news. 09-27-2022 upon evaluation today patient appears to be doing well she has no open wounds at this time her lymphedema seems to be doing quite well at this point. Fortunately I do not see any signs of active infection locally nor systemically which is great news and overall her swelling though up a little bit is not nearly as bad as it has been at times. No evidence of cellulitis currently. Electronic Signature(s) Signed: 09/27/2022 4:47:10 PM By: Worthy Keeler PA-C Entered By: Worthy Keeler on 09/27/2022 16:47:10 -------------------------------------------------------------------------------- Physical Exam Details Patient Name: Date of Service: RAYE, ERICKSEN 09/27/2022 10:00 A M Medical Record Number: [id number] Patient Account Number: [id number] Date of Birth/Sex: Treating RN: 26-Mar-1956 (67 y.o. [place] Primary Care Provider: Ranae Plumber Other Clinician: Massie Kluver Referring Provider: Treating Provider/Extender: Skeet Simmer in Treatment: 13 Constitutional Obese and well-hydrated in no acute distress. Respiratory normal breathing without difficulty. Psychiatric this patient is able to make decisions and demonstrates good insight into disease process. Alert and Oriented x 3. pleasant and cooperative. Notes Upon inspection patient's legs again showed no openings at this point which is great news and overall I do believe that she is headed in the right direction. I do not see any signs of active infection locally nor systemically at this time. Electronic Signature(s) Signed: 09/27/2022 4:47:26 PM By: Worthy Keeler PA-C Entered By: Worthy Keeler on 09/27/2022  16:47:25 -------------------------------------------------------------------------------- Physician Orders Details Patient Name: Date of Service: [name], [name] 09/27/2022 10:00 A M Medical Record Number: [id number] Patient Account Number: [id number] Date of Birth/Sex: Treating RN: [birth year] (67 y.o. 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 Nurse Visit as needed Bathing/ Shower/ Hygiene Clean wound with Normal Saline or wound cleanser. Wash wounds with antibacterial soap and water. May shower; gently cleanse wound with antibacterial soap, rinse and pat dry prior to dressing wounds No tub bath. Edema Control - Lymphedema / Segmental Compressive Device / Other A wraps ce Elevate, Exercise Daily and A void Standing for Long Periods of Time. Elevate leg(s) parallel to the floor when sitting. DO YOUR BEST to sleep in the bed at night. DO NOT sleep in your recliner. Long hours of sitting in a recliner leads to swelling of the legs and/or potential wounds on your backside. Other: - use lymphedema pumps Non-Wound Condition Bilateral Lower Extremities Cleanse affected area with antibacterial soap and water, pply appropriate compression. - use abdominal pads them ace wrap to secure A dditional non-wound orders/instructions: - silver cell to open weeping  areas A Medications-Please add to medication list. ntibiotic - apply Mupirocin to weepy areas as needed Topical A Electronic Signature(s) Signed: 09/27/2022 11:20:18 AM By: Massie Kluver Signed: 09/27/2022 5:06:45 PM By: Worthy Keeler PA-C Entered By: Massie Kluver on 09/27/2022 11:02:19 -------------------------------------------------------------------------------- Problem List Details Patient Name: Date of Service: [name] 09/27/2022 10:00 A M Medical Record Number: [id number] Patient Account Number: [id number] Date of Birth/Sex: Treating RN: June 20, 1956 (67 y.o. 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 08/01/2021 No Yes of skin JAZAYA, [name] ([id number]) [id number] Page 8 of 14 [phone] Non-pressure chronic ulcer of other part of left lower leg limited to breakdown 08/01/2021 No Yes of skin Inactive Problems Resolved Problems Electronic Signature(s) Signed: 09/27/2022 9:57:34 AM By: Worthy Keeler PA-C Entered By: Worthy Keeler on 09/27/2022 09:57:33 -------------------------------------------------------------------------------- Progress Note Details Patient Name: Date of Service: [name], [name] 09/27/2022 10:00 A M Medical Record Number: [id number] Patient Account Number: [id number] Date of Birth/Sex: Treating RN: 05-07-1956 (67 y.o. [place] [place] [facility] Provider: Ranae Plumber Other Clinician: Massie Kluver Referring Provider: Treating Provider/Extender: Skeet Simmer in Treatment: 53 Subjective Chief Complaint Information obtained from  Patient Bilateral LE lymphedema with Left LE Ulcers History of Present Illness (HPI) The patient is a 67 year old female with history of hypertension and a long-standing history of bilateral lower extremity lymphedema (first presented on 4/2) . She has had open ulcers in the past which have always responded to compression therapy. She had briefly been to a lymphedema clinic in the past which helped her at the time. this time around she stopped treatment of her lymphedema pumps approximately 2 weeks ago because of some pain in the knees and then noticed the right leg getting worse. She was seen by her PCP who put her on clindamycin 4 times a day 2 days ago. The patient has seen [facility] and Dr. Delana Meyer had seen her last year where a vascular study including venous and arterial duplex studies were within normal limits. he had recommended compression stockings and lymphedema pumps and the patient has  been using this in about 2 weeks ago. She is known to be diabetic but in the past few time she's gone to her primary care doctor her hemoglobin A1c has been normal. 02/11/2015 - after her last visit she took my advice and went to the ER regarding the progressive cellulitis of her right lower extremity and she was admitted between July 17 and 22nd. She received IV antibiotics and then was sent home on a course of steroid-induced and oral antibiotics. She has improved much since then. 02/17/2015 -- she has been doing fine and the weeping of her legs has remarkably gone down. She has no fresh issues. READMISSION 01/15/18 This patient was given this clinic before most recently in 2016 seen by Dr. Con Memos. She has massive bilateral lymphedema and over the last 2 months this had weeping edema out of the left leg. She has compression pumps but her compliance with these has been minimal. She has [facility] they've been using TCA/ABDs/kerlix under an Ace wrap.she has had recent problems with cellulitis. She was  apparently seen in the ER and 12/23/17 and given clindamycin. She was then followed by her primary doctor and given doxycycline and Keflex. The pain seems to have settled down. In April 2018 the patient had arterial studies done at [facility]. This showed triphasic waveforms throughout the right leg and mostly triphasic waveforms on the left except for monophasic at the posterior tibial artery distally. She was not felt to have evidence of right lower extremity arterial stenosis or significant problems on the left side. 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 She also had a right lower extremity venous Doppler in January 2018 this was limited by the patient's body habitus and lymphedema. Most of the proximal veins were not visualized The patient presents with an area of denuded skin on the anterior medial part of the left calf. There is weeping edema fluid here. 01/22/18; the patient has somewhat better edema control using her compression pumps twice a day and as a result she has much better epithelialization on the left anterior calf area. Only a small open area remains. 01/29/18; the patient has been compliant with her compression pumps. Both the areas on her calf that healed. The remaining area on the left anterior leg is fully epithelialized SINDHUJA, BOLSTAD ([id number]) [id number] Page 9 of 14 Readmission: 02/20/2019 upon evaluation today patient presents for reevaluation due to issues that she is having with the bilateral lower extremities. She actually has wounds open on both legs. On the right she has an area in the crease of her leg on the right around the knee region which is actually draining quite a bit and actually has some fungal type appearance to it. She has been on nystatin powder that seems to have helped to some degree. In regard to the left lower extremity this is actually in the lower portion of her leg closer  to the ankle and again is continuing to drain as well unfortunately. There does not appear to be any signs of active infection at this time which is good news. No fevers, chills, nausea, vomiting, or diarrhea. She tells me that since she was seen last year she is actually been doing quite well for the most part with regard to her lower extremities. Unfortunately she now is experiencing a little bit more drainage at this time. She is concerned about getting this under control so that it does not get significantly worse. 02/27/2019  on evaluation today patient appears to be doing somewhat better in regard to her bilateral lower extremity wounds. She has been tolerating the dressing changes without complication. Fortunately there is no signs of active infection at this point. No fevers, chills, nausea, vomiting, or diarrhea. She did get her dressing supplies which is excellent news she was extremely excited to get these. She also got paperwork from [employer] for their financial assistance program where they may be able to help her out in the future if needed with supplies at discounted prices. 03/06/2019 on evaluation today patient appears to be doing a little worse with regard to both areas of weeping on her bilateral lower extremities. This is around the right medial knee and just above the left ankle. With that being said she is unfortunately not doing as well as I would like to see. I feel like she may need to potentially go see someone at the lymphedema clinic as the wraps that she needs or even beyond what we can do here at the wound care center. She really does not have wounds she just has open areas of weeping that are causing some difficulty for her. Subsequently because of this and the moisture I am concerned about the potential for infection I am going to likely give her a prophylactic antibiotic today, Keflex, just to be on the safe side. Nonetheless again there is no obvious signs of active infection  at this time. 03/13/2019 on evaluation today patient appears to be doing well with regard to her bilateral lower extremities where she has been weeping compared to even last week's evaluation. I see some areas of new skin growth which is excellent and overall I am very pleased with how things seem to be progressing. No fevers, chills, nausea, vomiting, or diarrhea. 03/20/2019 on evaluation today patient unfortunately is continuing to have issues with significant edema of the left lower extremity. Her right side seems to be doing much better. Unfortunately her left side is showing increased weeping of the lower portion of her leg. This is quite unfortunate obviously we were hoping to get her into the lymphedema clinic they really do not seem to when I see her how if she is draining. Despite the fact this is really not wound related but more lymphedema weeping related. Nonetheless I do not know that this can be helpful for her to even go for that appointment since again I am not sure there is much that they would actually do at this point. We may need to try a 4 layer compression wrap as best we can on her leg. She is on the Augmentin currently although I am still concerned about whether or not there could be potentially something going on infection wise I would obtain a culture though I understand is not the best being that is a surface culture I just 1 to make sure I do not seem to be missing anything. 03/27/2019 on evaluation today patient appears to be doing much better in regard to the left lower extremity compared to last week. Last week she had tremendous weeping which I think was subsequent to infection now she seems to be doing much better and very pleased. This is not completely healed but there is a lot of new skin growth and it has dried out quite a bit. Overall I think that we are doing well with how things are moving along at this time. No fevers, chills, nausea, vomiting, or  diarrhea. 04/03/2019 evaluation today patient appears  to be doing a little worse this week compared to last time I saw her. I think this may be due to the fact that she is having issues with not being able to sleep in her bed at least not until last night. She is therefore been in a lift chair and subsequently has also had issues with not been able to use her pumps since she could not get in bed. With that being said the patient overall seems to be doing okay I do think I may want extend the antibiotic for a little bit longer at least until we can see if her edema and her weeping gets better and if it is then obviously I can always discontinue the antibiotics as of next week however I want her to continue to have it over the next week. 04/10/2019 on evaluation today patient unfortunately is still doing poorly with regard to her left lower extremity. Her right is all things considering doing fairly well. On the left however she continues to have spreading of the area of infection and weeping which appears to be even a larger surface area than noted last week. She did have a positive culture for Pseudomonas in particular which seems to have been of concern she still has green/yellow discharge consistent with Pseudomonas and subsequently a tremendous amount of it. This has me obviously still concerned about the infection not really clearing up despite the fact that on culture it appears the Cipro should have been a good option for treating this. I think she may at this point need IV antibiotics since things are not doing better I do not want to get worse and cause sepsis. She is in agreement with the plan and believes as well that she likely does need to go to the hospital for IV vancomycin. Or something of the like depending on what the recommendation is from the ER. 04/17/2019 on evaluation today patient appears to be doing excellent in regard to her lower extremity on the left. She was in the hospital for  several days from when I sent her last we saw her until just this past Tuesday. Fortunately her drainage is significantly improved and in fact is mostly clear. There is just a couple small areas that may still drain a little bit she states that the [facility] they prescribed for her at discharge she went picked up from pharmacy and got home but has not been able to find it since. She is looked everywhere. She is wondering if I will replace that for her today I will be more than happy to do that. 05/01/2019 on evaluation today patient actually appears to be doing quite well with regard to her lower extremities. She occasionally is having areas that will leak and then heal up mainly when a piece of the fibrotic skin pops off but fortunately she is not having any signs of active infection at this time. Overall she also really does not have any obvious weeping at this time. I do believe however she really needs some compression wraps and I think this may be a good time to get her back to the lymphedema clinic. 05/11/2019 on evaluation today patient actually appears to be doing quite well with regard to her bilateral lower extremities. She occasionally will have a small area that we per another but in general seems to be completely healed which is great news. Overall very pleased with how everything seems to be progressing. She does have her appointment with lymphedema clinic  on November 18. 05/25/2019 on evaluation today patient appears to be doing well with regard to her left lower extremity. I am very pleased in this regard. In regard to her right leg this actually did start draining more I think it is mainly due to the fact that her leg is more swollen. I am not seeing any obvious signs of infection at this time although that is definitely something were obviously acutely aware of simply due to the fact that she had an issue not too far back with exactly this issue. Nonetheless I do feel like that  lymphedema clinic would still be beneficial for her. I explained obviously if they are not able to do anything treatment wise on the right leg we could at least have them treat her left leg and then proceed from there. The patient is really in agreement with that plan. If they are able to do both as the drainage slows down that I would be happy to let them handle both. 06/01/2019 on evaluation today patient unfortunately appears to be doing worse with regard to her right lower extremity. The left lower extremity is still maintaining at this point. Unfortunately she has been having significantly increased pain over the past several days and has been experiencing as well increased swelling of the right lower extremity. I really do not know that I am seeing anything that appears to be obvious for infection at this point to be peripherally honest. With that being said the patient does seem to be having much more swelling that she is even experienced in the past and coupled with increased pain in her hip as well I am concerned that again she could potentially have a DVT although I am not 100% sure of this. I think it something that may need to be checked out. We discussed the possibility of sending her for a DVT study through the hospital but unfortunately transportation is an issue if she does have a DVT I do not want her to wait days to be able to get in for that test however if she has this scheduled as an outpatient that is as fast that she will be able to get the test scheduled for transportation purposes. That will also fall on Thanksgiving so subsequently she did actually be looking at either Friday or even next week before we would know anything back from this. That is much too long in my opinion. Subsequent to the amount of discomfort she is experiencing the patient is actually okay with going to the ER for evaluation today. 06/12/2019 on evaluation today patient actually appears to be doing  significantly better compared to last time I saw her. Following when I last saw her she was actually in the hospital from that Monday until the following Sunday almost 1 full week. She actually was placed on Keflex in the hospital following the time for her to be discharged and Dr. Steva Ready has recommended 2 times a day dosing of the Keflex for the next year in order to help with more prophylactic/preventative measures with regard to her developing cellulitis. Overall I think this sounds like an excellent plan. The patient unfortunately is good to have trouble being treated at lymphedema clinic due to the fact that she really cannot get up on the bed that they have there. They also state that they cannot manage her as long as she has anything draining at this point. Obviously that is somewhat unfortunate as she does need help with edema control  but ADRINA, PUMPHREY ([id number]) [id number] Page 10 of 14 nonetheless we will have to do what we can for her outside of it sounds like the lymphedema clinic scenario at this point. 06/19/2019 on evaluation today patient appears to be doing fairly well with regard to her bilateral lower extremities. She is not nearly as swollen and shows no signs of infection at this point. There is no evidence of cellulitis whatsoever. She also has no open wounds or draining at this point which is also good news. No fever chills noted. She seems to be in very good spirits and in fact appears to be doing quite well. READMISSION 11/27/2019 This is a 67 year old woman that we have had in this clinic several times before including 2015, 16 and 19 and then most recently from 03/20/2019 through 06/19/2019 with bilateral lower extremity lymphedema. She has had previous arterial and reflux studies done years ago which were not all that remarkable. 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 She does have  a positive family history and she had large legs starting may be in her 30s. She was recently in hospital from 10/20/2019 through 10/28/2019 with right leg cellulitis. She was given Ancef and clindamycin and then Zosyn when a culture showed Pseudomonas. At that time there was purulent drainage. She was followed by infectious disease Dr Steva Ready. The patient is now back at home. She has noted increased swelling in the right and no drainage in her right leg mostly on the posterior medial aspect in the calf area. She has not had pain or fever. She has literally been improved lysing above dressings because her at the area of this is far too large for standard compression. She has been wrapping the areas with sheets to resorptive pads. She is found these helped somewhat. She does have an appointment with the lymphedema clinic in [place] in late June. Past medical history includes bilateral lymphedema, hypertension, obstructive sleep apnea with CPAP. Recent hospitalization with apparently Pseudomonas cellulitis of the right lower leg 12/15/2019 upon evaluation today patient appears to be doing a little bit worse in regard to her right lower extremity. Unfortunately she is having more weeping down in the lower portion of her leg. Fortunately there is no signs of active infection at this time. No fever chills noted. The patient states she is not having increased pain except for when she attempted to use the lymphedema pumps unfortunately she states that she did have pain when she did this. Otherwise we been using absorptive dressings of one type or another she is using diapers at home and then subsequently Ace wraps. In regard to the barrier cream we have discussed the possibility of derma cloud which she would like to try I do not have a problem with that. 12/22/2019 upon evaluation today patient actually appears to be doing better in regard to her leg ulcers at this point. Fortunately there does not appear  to be any signs of active infection which is great news and I am extremely pleased with where things are progressing at this time. There is no sign of active infection currently. The patient is very pleased to see things doing so well. 12/29/2019 upon evaluation today patient appears to be doing a little bit better in regard to her weeping in general over her lower extremities. She does have some signs of mild erythema little bit more than what I noted last week or rather last visit. Nonetheless I  think that my threshold for switching her antibiotics from Keflex to something else is very low at this point considering that she has had such severe infections in the past that seem to come almost out of nowhere. There is a little erythema and warmth noted of the lower portion of her leg compared to the upper which also makes me want to go ahead and address things more rapidly at this point. Likely I would switch out the Keflex for something like Levaquin ideally. 7/16; patient with severe bilateral lymphedema. She has superficial wounds albeit almost circumferential now on the left lateral lower leg. This may be new from last time. Small area on the right anterior lower leg and then another area on the right medial lower leg and of pannus fold. She has been using various absorptive garments. She states she is using her compression pumps once a day occasionally twice. Culture from her last visit here was negative 01/29/2020 on evaluation today patient appears to be doing excellent at this point in regard to her legs with regard to infection I see no signs of active infection at this point. She still does have unfortunately areas of weeping this is minimal on the right now her left is actually significantly worse although I do not think it is as bad as last week with Dr. Dellia Nims saw her. She has been trying to pump and elevate her legs is much as possible. She has previously been on the Keflex and in the past  for prevention that seems to do fairly well and likely can extend that today. 02/04/2020 on evaluation today patient appears to be doing better in regard to her legs bilaterally. Fortunately there is no signs of active infection at this time which is great news and overall she has less weeping on the left compared to the right and there is several spots where she is pretty much sealed up with no draining regions. Overall very pleased in this regard. 02/19/2020 on evaluation today patient appears to be doing very well in regard to her wounds currently. Fortunately there is no evidence of active infection overall very pleased with where things stand. She is significantly improved in regard to her edema I am extremely pleased in this regard she tells me that the popping no longer hurts and in fact she actually looks forward to it. 03/04/2020 on evaluation today patient appears to be doing excellent in regard to her lower extremities. Fortunately there is no signs of active infection at this time. No fevers, chills, nausea, vomiting, or diarrhea. 03/25/2020 on evaluation today patient appears to be doing a little bit more poorly in regard to her legs at this point. She tells me that she is still continue to have issues with drainage and this has been a little bit worse she was getting ready to start taking the Keflex again but wanted to see me first. Fortunately there is no signs of active infection at this time. No fevers, chills, nausea, vomiting, or diarrhea. 04/15/2020 upon evaluation today patient appears to be doing somewhat poorly in regard to her right leg. She tells me she has been having more pain she has been taking the Keflex that was previously prescribed unfortunately that just does not seem to help with this. She was hoping that the pain on her right was actually coming from the fact that she was having issues with her wrap having gotten caught in her recliner. With that being said she tells me  that she  knew something was not right. Currently her right leg is warm to touch along with being erythematous all the way up to around at least mid thigh as far as I can see. The left leg does not appear to be doing that badly though there is increased weeping around the ankle region. 05/06/2020 on evaluation today patient appears to be doing much better than last time I saw her. She did go to the hospital where she was admitted for 2 days and treated with antibiotic therapy. She was discharged with antibiotics as well and has done extremely well. I am extremely pleased with where things stand today. There is no signs of active infection at this time which is great news. 05/27/2020 upon evaluation today patient appears to be doing well with regard to her lower extremities bilaterally. She has just a very tiny area on the right leg which is opening on the left leg she is significantly improved though she still has several areas that do appear to be open this is minimal compared to what is been in the past. In general I am extremely pleased with where things stand today. The patient does tell me she is not been using her pumps quite as much as she should be. I do believe that is 1 area she can definitely work on. She has had a lot going on including a Covid exposure and apparently also a outbreak of likely shingles. 06/24/2020 upon evaluation today patient appears to be doing well with regard to her legs in general although the left leg unfortunately is showing some signs of erythema she does have a little bit of increased weeping and to be honest I am concerned about infection here. I discussed that with her today and I think that we may need to address this sooner rather than later she has been taking Keflex she is not really certain that is been making a big improvement however. No fevers, chills, nausea, vomiting, or diarrhea. 06/30/2020 upon evaluation today patient's legs actually seem to be doing  better in my opinion as compared to where they were last week. Fortunately there does not appear to be any signs of active infection. Her culture showed multiple organisms nothing predominate. With that being said the Levaquin seems to have done well I think she has improved since I last saw her as well. 07/14/2020 upon evaluation today patient appears to be doing actually better in regard to her lower extremities in my opinion. She has been tolerating the dressing changes without complication. Fortunately there is no signs of active infection at this time. 08/04/2020 upon evaluation today patient appears to be doing excellent in regard to her leg ulcers. Fortunately she has very little that is open at this point. This is great news. In fact I think that the [employer] medicated powder has been excellent for her. It seems to have done the trick where we had tried several other [name], [name] ([id number]) [id number] Page 11 of 14 things without as much success. Fortunately there is no evidence of active infection at this time. No fevers, chills, nausea, vomiting, or diarrhea. 09/01/2020 upon evaluation today patient appears to be doing a little bit more poorly than the last time I saw her. She tells me right now that she has been having a lot of drainage compared to where things were previous which has unfortunately led to more irritation as well. She is concerned that this is leading to infection. Again based on what I am seeing  today as well I am also concerned of the same to be honest. 09/15/2020 upon evaluation today patient appears to be doing well with regard to her legs compared to what I saw previous. Fortunately there does not appear to be any evidence of active infection at this time which is great news. At least not as badly as what it was previous. With that being said I do believe that the patient does need to have an extension of the antibiotics. This I believe will  actually help her more in the way of making sure this stays under control and does not worsen. That was the Bactrim DS. Readmission: 08/01/2021 upon evaluation today patient appears to be doing actually pretty well all things considered. Is actually been almost a year since have seen her last March. Fortunately I do not see any evidence of active infection locally nor systemically at this point. She does have a couple open areas on the left leg the right leg appears to be doing quite well. In general she has been in the hospital 3 times since I saw her a year ago all 4 cellulitis type issues except for the last 1 which was actually more related to congestive heart failure for that reason she is not using her lymphedema pumps at this point and that is probably the best idea. 08/08/2021 upon evaluation today patient appears to be doing well with regard to her legs all things considered her left leg may be a little bit more swollen based on what I see today. Fortunately I do not see any signs of active infection locally nor systemically at this time which is great news. No fevers, chills, nausea, vomiting, or diarrhea. 08/15/2021 upon evaluation today patient actually appears to be doing excellent in regard to her wounds. She has been tolerating the dressing changes without complication. Fortunately I see no evidence of infection currently which is great news. No fevers, chills, nausea, vomiting, or diarrhea. 08/29/2021 upon evaluation patient appears to be doing excellent she in fact is almost completely healed. I am actually very pleased with where we stand today and I think that she is making wonderful progress she just has a small area of weeping on the right lateral leg everything else is pretty much completely healed which is also. 09/05/2021 upon evaluation today patient appears to be doing well with regard to her legs. She does have a small area of weeping on the right leg and there is a small area  on the left leg as well. It is potentially something that may need to be addressed in the future more specifically but right now I think that there may have just been a little drainage here on the left. With all that being said I think that this still does not appear to be infected and overall I feel like she is doing quite well. That something we always have to keep a very close eye on as she can change very rapidly from okay to not okay. 09/12/2021 upon evaluation today patient appears to be doing a little bit worse in regard to her leg and actually somewhat concerned about the possibility of infection here. I discussed that with the patient. For that reason I am going to go ahead and see about getting her set up for a repeat prescription for the Bactrim. She is in agreement with that plan. 3/14; patient presents for follow-up. She has been using silver alginate with dressing changes. She is still taking Bactrim prescribed at  last clinic visit. She has no issues or complaints today. She has lymphedema pumps however does not use these. 09/26/2020 upon evaluation today patient appears to actually be doing well in regard to her right leg I am pleased in that regard. Unfortunately she has new areas on her left leg that are open at this point that I do need to be addressed. I am also concerned about the warmth around one of the wounds on the more anterior side. I think this is something that we may need to culture today potentially requiring antibiotics going forward. 10/03/2021 upon evaluation today patient unfortunately is not doing nearly as well as she was even last week with her wounds. She is having some issues here with increased cellulitis of the left lower extremity unfortunately. I did prescribe Augmentin for her eczema prescribed last week unfortunately she never actually received this prescription. The pharmacy stated they never got it. We double checked with them today they still did not receive  it. I am not sure what happened in that regard. With that being said the patient is in good spirits today she does not appear to be as sick as where she normally is when the cellulitis gets significantly worse as it has in the past. Nonetheless the leg is much more red not just around the ankle where I was seeing at last week on Tuesday but rather now this is going all the way up to almost her knee and is definitely hot to touch compared to the right leg. Nonetheless I feel like she does need to go to the ER for likely IV antibiotics. 10-10-2021 upon evaluation today patient appears to be doing well with regard to her wound. She has been tolerating the dressing changes that appears to be doing much better. After I saw her last week she did go to the ER they admitted her to the hospital and gave her IV antibiotics she fortunately is doing significantly better. Overall I am extremely pleased with where things stand today. 10-17-2021 upon evaluation today patient appears to be doing well with regard to her wound. In fact this is looking better and her leg is looking better but she still does have some areas here of erythema. We will continue to address this as soon as possible in my opinion. I do believe she may require some debridement and what appears to be a more defined wound on her left leg at this point. 10-24-2021 upon evaluation patient is definitely showing signs of improvement which is great news. I do not see any evidence of active infection locally or systemically which is great news as well. No fevers, chills, nausea, vomiting, or diarrhea. 10-31-2021. Upon evaluation today patient's leg actually feels better from an infection and wound care perspective. I am actually very pleased in this regard. Unfortunately the biggest issue that I see at this time is that the patient is having a significant issue with her breathing. I did put her on the pulse ox machine to check her oxygen saturation and it  was pretty much at 100% which is good but she tells me that when she is walking this is much more difficult for her. She also tells me that when she is trying to bend over to perform her dressing changes she is so short of breath due to the swelling and fluid in her abdominal area that she is just not been able to do this. She is tearful and very concerned today to be honest. I  am truly sorry to see her this way I know she is really having a hard time at the moment. 11-14-2021 upon evaluation patient actually appears to be doing significantly better compared to when I last saw her. She was actually admitted to the hospital I believe this for 6 days total after I sent her on 10-31-2021. Subsequently they actually pulled off greater than 50 pounds of fluid she tells me she feels significantly better her legs are not as tight she actually has some play in the tissue and it is obvious that she is doing much better just from a mobility standpoint as well as a breathing standpoint. Overall I am extremely happy for her and how she is doing the leg also looks much better on the left. 11-21-2021 upon evaluation today patient appears to be doing well although it does appear that she is going require some sharp debridement the wound is very dry this is the opposite of what its been she was having so much weeping that it was staying extremely wet. Nonetheless we do need to see about going ahead and debriding the wound today. 11-28-2021 upon evaluation today patient appears to be doing well with regard to her wound I definitely see signs of things continuing to improve which is great news. I do not see any evidence of active infection locally or systemically also great news. 12-05-2021 upon evaluation today patient appears to be doing well with regard to her wound. She has been tolerating the dressing changes. Fortunately there does not appear to be any signs of active infection locally or systemically at this time. No  fevers, chills, nausea, vomiting, or diarrhea. 12-12-2021 upon evaluation patient appears to be doing well with regard to her wound this is actually measuring smaller and looking much better. Fortunately I do not see any signs of active infection locally or systemically at this time which is excellent news. 6/13; small wound area in the folds of her lymphedema just above the left ankle. Fortunately the area looks quite good. She has been using silver alginate ABDs and an Ace wrap which she is able to change her self. [name], [name] ([id number]) [id number] Page 12 of 14 She is not using her compression pumps out of fear that this could contribute to heart failure. 12-26-2021 upon evaluation patient's wounds are actually showing signs of excellent improvement. I am very pleased with where things stand. Overall I think that she has been making excellent progress and I think we are very close to complete resolution which is great news. 01-11-2022 upon evaluation today patient appears to be doing well currently in regard to her legs in general although on the left leg she does have 1 area that still irritated and inflamed on the anterior portion of her leg. Fortunately I do not see any signs of systemic infection locally there might be some infection going on here however which is my biggest concern. Fortunately I do not see any evidence though of this spreading to any other location. 01-16-2022 upon evaluation today patient has actually showing signs of excellent improvement. Fortunately I do not see any evidence of infection locally or systemically which is great news and overall I am very pleased with where we stand at this point. 01-25-2022 upon evaluation today patient actually appears to be doing decently well in regard to her legs. She has been tolerating the dressing changes without complication. Fortunately there does not appear to be any evidence of active infection locally  or systemically  which is great news and overall I am extremely pleased with where we stand at this point. 02-13-2022 upon evaluation today patient appears to be doing well currently in regard to her wound which is actually showing signs of significant improvement. Fortunately I do not see any evidence of active infection locally or systemically at this time. No fevers, chills, nausea, vomiting, or diarrhea. 02-20-2022 upon evaluation today patient appears to be doing well currently in regard to her wound. She has been tolerating the dressing changes without complication. Fortunately there does not appear to be any signs of active infection locally or systemically at this time. No fevers, chills, nausea, vomiting, or diarrhea. 02-27-2022 upon evaluation today patient appears to be doing excellent in regard to her wounds on the leg. She has been tolerating the dressing changes without complication this is very close to complete resolution. Fortunately I do not see any signs of infection locally or systemically at this time which is great news. No fevers, chills, nausea, vomiting, or diarrhea. 03-06-2022 upon evaluation today patient appears to be doing well currently in regard to her wound in general. She has been tolerating the dressing changes without complication. Fortunately I do not see any evidence of active infection locally or systemically at this time which is great news. I am concerned a little bit however about not really her wounds but the second toe right foot where there appears to be some cellulitis after she dropped a frying pan on this about 2 weeks ago. She tells me its been itching and burning and I do think that it is possible she may have an infection based on what we are seeing currently. 03-20-2022 upon evaluation today patient actually appears to be doing much better at this point. Fortunately there does not appear to be any signs of active infection locally or systemically at this  time which is great news. No fevers, chills, nausea, vomiting, or diarrhea. 03-27-2022 upon evaluation today patient appears to be doing somewhat poorly in regard to her legs compared to previous. Her right leg has a couple areas that are open and she is very warm and painful to touch around the lateral portion of the ankle area. Left leg is showing signs of little bit more drainage and weeping as well I think that this is probably due to the fact that she is swelling a lot more that she has been in the past. Fortunately I do not see any signs of active infection systemically though locally there is definitely some issues here. 04-03-2022 upon evaluation today patient appears to be doing poorly in regard to her wounds on the legs unfortunately. She also seems to have more significant cellulitis that has ensued. Unfortunately she has been on doxycycline initially it seemed to be helping but as of this week that is no longer the case. It started getting worse last week and with the reinitiation of the doxycycline nothing has improved. Often when she gets like this IV antibiotics are necessary. 04-19-2022 upon evaluation today patient appears to be doing well currently in regard to her legs from an infection standpoint. She did get IV vancomycin in the hospital and states that her legs did get better when she left she was no longer weeping but she had been in the bed for 4 days straight. With that being said since coming home her legs have begun to weep again the original wound that we were dealing with has healed she does have some weeping over both lower  extremities however which is still of concern. 05-03-2022 upon evaluation today patient appears to be doing well currently in regard to her left leg which I feel like is a little bit better unfortunately the right leg is still draining significantly. Fortunately I do not see any signs of infection locally or systemically at this time. No fevers, chills,  nausea, vomiting, or diarrhea. 05-10-2022 unfortunately patient actually showed signs of increasing erythema today. I believe that the cellulitis never completely resolved from when she was in the hospital last. Again she has been on doxycycline previously by myself I gave her Keflex more recently as an ongoing prescription to try to prevent infection but again I do not think this is strong enough to be able to get this under control. I really feel like that she is probably can need IV antibiotics and a good period of time in the hospital to be able to keep her legs elevated and not having to get around to do a whole lot in order to allow this to resolve. The patient voiced understanding. 05-24-2022 upon evaluation today patient appears to be doing excellent in regard to her legs. She has been tolerating the dressing changes without complication. Fortunately there does not appear to be any signs of infection at this time which is great news she has been on cefadroxil since she was discharged from the hospital. The day that we sent her she actually tells me that she ended up in septic shock that day and coded. When she first showed up they did not notice it on the lab work but said by the end of the day she had already undergone this and they had had to address the issue obviously when she coded. She tells me that likely she would not be here today if she had not gone to the hospital that day. I explained to her that I do believe that was a God thing and it was just the perfect timing that he had for her at that time. 06-14-2022 upon evaluation today patient appears to be doing well currently in regard to her legs. In general I think she is making good progress and I do not see any signs of infection at this time which is great news. No fevers, chills, nausea, vomiting, or diarrhea. 06-28-2022 upon evaluation today patient appears to be doing excellent in regard to her legs. She is actually showing signs  of excellent improvement I do not see any evidence of infection locally nor systemically at this time. No fevers, chills, nausea, vomiting, or diarrhea. 07-26-2022 upon eval evaluation today patient appears to be doing well currently in regard to her wound. She has been tolerating the dressing changes without complication. Fortunately I do not see any major issues here she has a couple areas of weeping mainly on the right leg the left leg seems to be doing pretty well. 08-23-2021 upon evaluation today patient appears to be doing excellent in regard to her legs. These are showing signs of wonderful improvement I am actually very pleased with where we stand I think she is doing the best that I have ever seen her. Fortunately I do not see any evidence of infection locally or systemically which is great news. 09-27-2022 upon evaluation today patient appears to be doing well she has no open wounds at this time her lymphedema seems to be doing quite well at this point. Fortunately I do not see any signs of active infection locally nor systemically which is great  news and overall her swelling though up a little bit is not nearly as bad as it has been at times. No evidence of cellulitis currently. [name], [name] ([id number]) [id number] Page 13 of 14 Objective Constitutional Obese and well-hydrated in no acute distress. Vitals Time Taken: 10:10 AM, Height: 63 in, Weight: 372 lbs, BMI: 65.9, Temperature: 97.9 F, Pulse: 95 bpm, Respiratory Rate: 18 breaths/min, Blood Pressure: 124/88 mmHg. Respiratory normal breathing without difficulty. Psychiatric this patient is able to make decisions and demonstrates good insight into disease process. Alert and Oriented x 3. pleasant and cooperative. General Notes: Upon inspection patient's legs again showed no openings at this point which is great news and overall I do believe that she is headed in the right direction. I do not see any  signs of active infection locally nor systemically at this time. Other Condition(s) Patient presents with Lymphedema located on the Right Leg. General Notes: no open areas noted Patient presents with Lymphedema located on the Left Leg. 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 Nurse Visit as needed Bathing/ Shower/ Hygiene: Clean wound with Normal Saline or wound cleanser. Wash wounds with antibacterial soap and water. May shower; gently cleanse wound with antibacterial soap, rinse and pat dry prior to dressing wounds No tub bath. Edema Control - Lymphedema / Segmental Compressive Device / Other: Ace wraps Elevate, Exercise Daily and Avoid Standing for Long Periods of Time. Elevate leg(s) parallel to the floor when sitting. DO YOUR BEST to sleep in the bed at night. DO NOT sleep in your recliner. Long hours of sitting in a recliner leads to swelling of the legs and/or potential wounds on your backside. Other: - use lymphedema pumps Non-Wound Condition: Cleanse affected area with antibacterial soap and water, Apply appropriate compression. - use abdominal pads them ace wrap to secure Additional non-wound orders/instructions: - silver cell to open weeping areas Medications-Please add to medication list.: Topical Antibiotic - apply Mupirocin to weepy areas as needed 1. I am recommend currently that she continue with her wraps that she has been doing previous. She seems to do a pretty good job and other than in the past week when she was unfortunately having issues with her left arm and was not able to wrap her legs she is taking care of herself primarily. 2. Based on what I am seeing I am going to  suggest as well should continue with regular follow-ups at the heart failure clinic they are doing a great job at maintaining her fluid control. We will see patient back for reevaluation in 4 weeks here in the clinic. If anything worsens or changes patient will contact our office for additional recommendations. Electronic Signature(s) Signed: 09/27/2022 4:48:03 PM By: Worthy Keeler PA-C Entered By: Worthy Keeler on 09/27/2022 16:48:02 TAWNIA, ERBER ([id number]) [id number] Page 14 of 14 -------------------------------------------------------------------------------- SuperBill Details Patient Name: Date of Service: [name], [name] 09/27/2022 Medical Record Number: [id number] Patient Account Number: [id number] Date of Birth/Sex: Treating RN: 09/14/1955 (67 y.o. [place] Primary Care Provider: Ranae Plumber Other Clinician: Massie Kluver Referring Provider: Treating Provider/Extender: Skeet Simmer in Treatment: 60 Diagnosis Coding ICD-10 Codes Code Description Q82.0 Hereditary lymphedema L97.811 Non-pressure chronic ulcer of other part of right lower  leg limited to breakdown of skin L97.821 Non-pressure chronic ulcer of other part of left lower leg limited to breakdown of skin Facility Procedures : CPT4 Code: [id number] Description: [phone] - WOUND CARE VISIT-LEV 2 EST PT Modifier: Quantity: 1 Physician Procedures : CPT4 Code Description Modifier [id number] - WC PHYS LEVEL 3 - EST PT ICD-10 Diagnosis Description Q82.0 Hereditary lymphedema L97.811 Non-pressure chronic ulcer of other part of right lower leg limited to breakdown of skin L97.821 Non-pressure  chronic ulcer of other part of left lower leg limited to breakdown of skin Quantity: 1 Electronic Signature(s) Signed: 09/27/2022 4:48:30 PM By: Worthy Keeler PA-C Previous Signature: 09/27/2022 11:20:18 AM Version By: Massie Kluver Entered By: Worthy Keeler on  09/27/2022 16:48:29

## 2022-09-28 NOTE — Progress Notes (Addendum)
SHARRONE, CUPPS ([id number]) [id number] Page 1 of 9 Visit Report for 09/27/2022 Arrival Information Details Patient Name: Date of Service: CAGNEY, FALSO 09/27/2022 10:00 A M Medical Record Number: [id number] Patient Account Number: [id number] Date of Birth/Sex: Treating RN: Sep 11, 1955 (67 y.o. Valetta Close Primary Care Saim Almanza: Ranae Plumber Other Clinician: Massie Kluver Referring Javad Salva: Treating Zach Tietje/Extender: Skeet Simmer in Treatment: 35 Visit Information History Since Last Visit Added or deleted any medications: No Patient Arrived: Gilford Rile Any new allergies or adverse reactions: No Arrival Time: 10:07 Had a fall or experienced change in No Accompanied By: self activities of daily living that may affect Transfer Assistance: EasyPivot Patient Lift risk of falls: Patient Identification Verified: Yes Hospitalized since last visit: No Secondary Verification Process Completed: Yes Has Dressing in Place as Prescribed: Yes Patient Requires Transmission-Based Precautions: No Pain Present Now: Yes Patient Has Alerts: Yes Patient Alerts: Borderline Diabetic Electronic Signature(s) Signed: 09/27/2022 3:58:45 PM By: [name] Dredge Entered By: [name] Dredge on 09/27/2022 10:10:39 -------------------------------------------------------------------------------- Clinic Level of Care Assessment Details Patient Name: Date of Service: YOLAND, ISABEL 09/27/2022 10:00 A M Medical Record Number: [id number] Patient Account Number: [id number] Date of Birth/Sex: Treating RN: [birth year] (67 y.o. Marlowe Shores Primary Care Shad Ledvina: Ranae Plumber Other Clinician: Massie Kluver Referring Tambra Muller: Treating Deaundra Kutzer/Extender: Skeet Simmer in Treatment: 60 Clinic Level of Care Assessment Items TOOL 4 Quantity Score []  - 0 Use when only an EandM is performed on FOLLOW-UP visit ASSESSMENTS - Nursing  Assessment / Reassessment X- 1 10 Reassessment of Co-morbidities (includes updates in patient status) X- 1 5 Reassessment of Adherence to Treatment Plan ASSESSMENTS - Wound and Skin A ssessment / Reassessment []  - 0 Simple Wound Assessment / Reassessment - one wound KAISEE, MELILLO ([id number]) [id number] Page 2 of 9 []  - 0 Complex Wound Assessment / Reassessment - multiple wounds X- 1 10 Dermatologic / Skin Assessment (not related to wound area) ASSESSMENTS - Focused Assessment X- 2 5 Circumferential Edema Measurements - multi extremities []  - 0 Nutritional Assessment / Counseling / Intervention []  - 0 Lower Extremity Assessment (monofilament, tuning fork, pulses) []  - 0 Peripheral Arterial Disease Assessment (using hand held doppler) ASSESSMENTS - Ostomy and/or Continence Assessment and Care []  - 0 Incontinence Assessment and Management []  - 0 Ostomy Care Assessment and Management (repouching, etc.) PROCESS - Coordination of Care X - Simple Patient / Family Education for ongoing care 1 15 []  - 0 Complex (extensive) Patient / Family Education for ongoing care []  - 0 Staff obtains Programmer, systems, Records, T Results / Process Orders est []  - 0 Staff telephones HHA, Nursing Homes / Clarify orders / etc []  - 0 Routine Transfer to another Facility (non-emergent condition) []  - 0 Routine Hospital Admission (non-emergent condition) []  - 0 New Admissions / Biomedical engineer / Ordering NPWT Apligraf, etc. , []  - 0 Emergency Hospital Admission (emergent condition) X- 1 10 Simple Discharge Coordination []  - 0 Complex (extensive) Discharge Coordination PROCESS - Special Needs []  - 0 Pediatric / Minor Patient Management []  - 0 Isolation Patient Management []  - 0 Hearing / Language / Visual special needs []  - 0 Assessment of Community assistance (transportation, D/C planning, etc.) []  - 0 Additional assistance / Altered mentation []  -  0 Support Surface(s) Assessment (bed, cushion, seat, etc.) INTERVENTIONS - Wound Cleansing / Measurement []  - 0 Simple Wound Cleansing - one wound []  - 0 Complex Wound Cleansing - multiple wounds []  - 0 Wound Imaging (photographs -  any number of wounds) []  - 0 Wound Tracing (instead of photographs) []  - 0 Simple Wound Measurement - one wound []  - 0 Complex Wound Measurement - multiple wounds INTERVENTIONS - Wound Dressings []  - 0 Small Wound Dressing one or multiple wounds []  - 0 Medium Wound Dressing one or multiple wounds []  - 0 Large Wound Dressing one or multiple wounds []  - 0 Application of Medications - topical []  - 0 Application of Medications - injection INTERVENTIONS - Miscellaneous []  - 0 External ear exam []  - 0 Specimen Collection (cultures, biopsies, blood, body fluids, etc.) []  - 0 Specimen(s) / Culture(s) sent or taken to Lab for analysis MUSA, ROBITAILLE ([id number]) [id number] Page 3 of 9 []  - 0 Patient Transfer (multiple staff / Civil Service fast streamer / Similar devices) []  - 0 Simple Staple / Suture removal (25 or less) []  - 0 Complex Staple / Suture removal (26 or more) []  - 0 Hypo / Hyperglycemic Management (close monitor of Blood Glucose) []  - 0 Ankle / Brachial Index (ABI) - do not check if billed separately X- 1 5 Vital Signs Has the patient been seen at the hospital within the last three years: Yes Total Score: 65 Level Of Care: New/Established - Level 2 Electronic Signature(s) Signed: 09/27/2022 11:20:18 AM By: Massie Kluver Entered By: Massie Kluver on 09/27/2022 10:48:46 -------------------------------------------------------------------------------- Encounter Discharge Information Details Patient Name: Date of Service: [name] 09/27/2022 10:00 A M Medical Record Number: [id number] Patient Account Number: [id number] Date of Birth/Sex: Treating RN: [birth year] (67 y.o. Marlowe Shores Primary Care Masahiro Iglesia:  Ranae Plumber Other Clinician: Massie Kluver Referring Laurier Jasperson: Treating Kateline Kinkade/Extender: Skeet Simmer in Treatment: [phone] Encounter Discharge Information Items Discharge Condition: Stable Ambulatory Status: Walker Discharge Destination: Home Transportation: Other Accompanied By: self Schedule Follow-up Appointment: Yes Clinical Summary of Care: Electronic Signature(s) Signed: 09/27/2022 11:20:18 AM By: Massie Kluver Entered By: Massie Kluver on 09/27/2022 11:01:55 -------------------------------------------------------------------------------- Lower Extremity Assessment Details Patient Name: Date of Service: LARSYN, LOSA 09/27/2022 10:00 A M Medical Record Number: [id number] Patient Account Number: [id number] Date of Birth/Sex: Treating RN: 1956-02-26 (67 y.o. Valetta Close Primary Care Alle Difabio: Ranae Plumber Other Clinician: Aanisah, Trimble ([id number]) [id number] Page 4 of 9 Referring Yarethzy Croak: Treating Anuja Manka/Extender: Skeet Simmer in Treatment: 60 Edema Assessment Assessed: [Left: No] [Right: No] Edema: [Left: Yes] [Right: Yes] Calf Left: Right: Point of Measurement: 34 cm From Medial Instep 83 cm 83.5 cm Ankle Left: Right: Point of Measurement: 10 cm From Medial Instep 51.2 cm 52.1 cm Vascular Assessment Pulses: Dorsalis Pedis Palpable: [Left:Yes] [Right:Yes] Electronic Signature(s) Signed: 09/27/2022 3:58:45 PM By: [name] Dredge Entered By: [name] Dredge on 09/27/2022 10:18:11 -------------------------------------------------------------------------------- Multi Wound Chart Details Patient Name: Date of Service: [name] 09/27/2022 10:00 A M Medical Record Number: [id number] Patient Account Number: [id number] Date of Birth/Sex: Treating RN: October 04, 1955 (67 y.o. Marlowe Shores Primary Care Naijah Lacek: Ranae Plumber Other Clinician: Massie Kluver Referring Nilam Quakenbush: Treating Zionah Criswell/Extender: Skeet Simmer in Treatment: 60 Vital Signs Height(in): 63 Pulse(bpm): 95 Weight(lbs): 372 Blood Pressure(mmHg): 124/88 Body Mass Index(BMI): 65.9 Temperature(F): 97.9 Respiratory Rate(breaths/min): 18 [Treatment Notes:Wound Assessments Treatment Notes] Electronic Signature(s) Signed: 09/27/2022 11:20:18 AM By: Massie Kluver Entered By: Massie Kluver on 09/27/2022 10:35:37 [name] ([id number]) [id number] Page 5 of 9 -------------------------------------------------------------------------------- Multi-Disciplinary Care Plan Details Patient Name: Date of Service: KATRINNA, HANLAN 09/27/2022 10:00 A M Medical Record Number: [id number] Patient Account Number: [id number] Date of  Birth/Sex: Treating RN: 09/02/1955 (67 y.o. Charolette Forward, Kim Primary Care Daman Steffenhagen: Ranae Plumber Other Clinician: Massie Kluver Referring Haylin Camilli: Treating Caydence Enck/Extender: Skeet Simmer in Treatment: 60 Active Inactive Wound/Skin Impairment Nursing Diagnoses: Impaired tissue integrity Knowledge deficit related to ulceration/compromised skin integrity Goals: Ulcer/skin breakdown will have a volume reduction of 30% by week 4 Date Initiated: 08/01/2021 Target Resolution Date: 08/29/2021 Goal Status: Active Ulcer/skin breakdown will have a volume reduction of 50% by week 8 Date Initiated: 08/01/2021 Target Resolution Date: 09/26/2021 Goal Status: Active Ulcer/skin breakdown will have a volume reduction of 80% by week 12 Date Initiated: 08/01/2021 Target Resolution Date: 10/24/2021 Goal Status: Active Ulcer/skin breakdown will heal within 14 weeks Date Initiated: 08/01/2021 Target Resolution Date: 11/07/2021 Goal Status: Active Interventions: Assess patient/caregiver ability to obtain necessary supplies Assess patient/caregiver ability to perform ulcer/skin care regimen upon  admission and as needed Assess ulceration(s) every visit Provide education on ulcer and skin care Notes: Electronic Signature(s) Signed: 09/27/2022 11:20:18 AM By: Massie Kluver Signed: 09/27/2022 4:35:37 PM By: Gretta Cool, BSN, RN, CWS, Kim RN, BSN Entered By: Massie Kluver on 09/27/2022 10:48:58 -------------------------------------------------------------------------------- Non-Wound Condition Assessment Details Patient Name: Date of Service: Mitten, JEA N C. 3/21/2024andnbsp10:00 [place] Record Number: [id number] Patient Account Number: [id number] ALYSIA, MORGESE ([id number]) [id number] Page 6 of 9 Date of Birth/Gender: Treating RN: [birth year] (67 y.o. Valetta Close Primary Care Physician: Ranae Plumber Other Clinician: Massie Kluver Referring Physician: Treating Physician/Extender: Skeet Simmer in Treatment: 60 Non-Wound Condition: Condition: Lymphedema Location: Leg Side: Right Notes: Right leg with decreased drainage this visit. Photos Notes no open areas noted Electronic Signature(s) Signed: 09/27/2022 3:58:45 PM By: [name] Dredge Entered By: [name] Dredge on 09/27/2022 10:19:31 -------------------------------------------------------------------------------- Non-Wound Condition Assessment Details Patient Name: Date of Service: Nobles, JEA N C. 3/21/2024andnbsp10:00 A M Medical Record Number: [id number] Patient Account Number: [id number] Date of Birth/Gender: Treating RN: 1955/09/15 (67 y.o. Valetta Close Primary Care Physician: Ranae Plumber Other Clinician: Massie Kluver Referring Physician: Treating Physician/Extender: Skeet Simmer in Treatment: 60 Non-Wound Condition: Condition: Lymphedema Location: Leg Side: Left Notes: decreased drainage this visit. Photos AZILE, ANTONOVICH ([id number]) [id number] Page 7 of 9 Notes no open areas  noted Electronic Signature(s) Signed: 09/27/2022 3:58:45 PM By: [name] Dredge Entered By: [name] Dredge on 09/27/2022 10:19:31 -------------------------------------------------------------------------------- Pain Assessment Details Patient Name: Date of Service: TERRON, FLESNER 09/27/2022 10:00 A M Medical Record Number: [id number] Patient Account Number: [id number] Date of Birth/Sex: Treating RN: [birth year] (68 y.o. Valetta Close Primary Care Daesia Zylka: Ranae Plumber Other Clinician: Massie Kluver Referring Davin Muramoto: Treating Zafar Debrosse/Extender: Skeet Simmer in Treatment: 60 Active Problems Location of Pain Severity and Description of Pain Patient Has Paino Yes Site Locations Rate the pain. Current Pain Level: 3 Pain Management and Medication Current Pain Management: TARRAH, GAIL ([id number]) [id number] Page 8 of 9 Notes pt states sciatic pain Electronic Signature(s) Signed: 09/27/2022 3:58:45 PM By: [name] Dredge Entered By: [name] Dredge on 09/27/2022 10:11:12 -------------------------------------------------------------------------------- Patient/Caregiver Education Details Patient Name: Date of Service: Minnich, JEA N C. 3/21/2024andnbsp10:00 [place] Record Number: [id number] Patient Account Number: [id number] Date of Birth/Gender: Treating RN: 1956/01/05 (67 y.o. Marlowe Shores Primary Care Physician: Ranae Plumber Other Clinician: Massie Kluver Referring Physician: Treating Physician/Extender: Skeet Simmer in Treatment: 68 Education Assessment Education Provided To: Patient Education Topics Provided Wound/Skin Impairment: Handouts: Other: continue wound care as directed Methods: Explain/Verbal Responses: State content correctly Electronic Signature(s) Signed: 09/27/2022 11:20:18  AM By: Massie Kluver Entered By: Massie Kluver on 09/27/2022  10:49:17 -------------------------------------------------------------------------------- Vitals Details Patient Name: Date of Service: DECLAN, KINSTLER 09/27/2022 10:00 A M Medical Record Number: [id number] Patient Account Number: [id number] Date of Birth/Sex: Treating RN: 1956/05/01 (67 y.o. Valetta Close Primary Care Bryan Goin: Ranae Plumber Other Clinician: Massie Kluver Referring Aara Jacquot: Treating Casee Knepp/Extender: Skeet Simmer in Treatment: 60 Vital Signs Time Taken: 10:10 Temperature (F): 97.9 Height (in): 63 Pulse (bpm): 95 Weight (lbs): 372 Respiratory Rate (breaths/min): 18 Kosel, Chyler C ([id number]) [id number] Page 9 of 9 Body Mass Index (BMI): 65.9 Blood Pressure (mmHg): 124/88 Reference Range: 80 - 120 mg / dl Electronic Signature(s) Signed: 09/27/2022 3:58:45 PM By: [name] Dredge Entered By: [name] Dredge on 09/27/2022 10:10:55

## 2022-10-03 NOTE — Progress Notes (Signed)
Patient ID: [name], female   DOB: [birth year], 67 y.o.   MRN: [id number]  [place] - PHARMACIST COUNSELING NOTE  *HFpEF*  ACE/ARB/ARNI:  na/ - swelling with ACEi Beta Blocker:  atenolol discontinued d/t hypotension Aldosterone Antagonist:  spironolactone 12.5 mg daily Diuretic:Bumetanide 2mg  every other day, plus metolazone 2.5mg  on Tu and Sat SGLT2i:  dapagliflozin 10mg  daily   Adherence Assessment  Do you ever forget to take your medication? [] Yes [x] No  Do you ever skip doses due to side effects? [] Yes [x] No  Do you have trouble affording your medicines? [] Yes [x] No  Are you ever unable to pick up your medication due to transportation difficulties? [] Yes [x] No  Do you ever stop taking your medications because you don't believe they are helping? [x] Yes [] No  Do you check your weight daily? [x] Yes [] No   Adherence strategy: none  Barriers to obtaining medications: none  Vital signs: HR 117, BP 129/79, weight (pounds) 392 lbs  ECHO: Date 11/01/21, EF 60-65%     Latest Ref Rng & Units 08/31/2022   11:14 AM 08/03/2022   10:19 AM 07/23/2022   11:55 AM  BMP  Glucose 70 - 99 mg/dL 93  98  89   BUN 8 - 23 mg/dL 47  35  27   Creatinine 0.44 - 1.00 mg/dL 1.81  1.28  1.32   Sodium 135 - 145 mmol/L 136  137  137   Potassium 3.5 - 5.1 mmol/L 3.5  3.8  3.7   Chloride 98 - 111 mmol/L 97  99  101   CO2 22 - 32 mmol/L 27  27  25    Calcium 8.9 - 10.3 mg/dL 9.5  9.4  8.4     Past Medical History:  Diagnosis Date   Arthritis    Asthma    CHF (congestive heart failure) (HCC)    Enlarged heart    Hypertension    Lymphedema    Sleep apnea    Spinal stenosis     ASSESSMENT 67 year old female who presents to the HF clinic for follow up. PMH includes asthma, HTN, arthritis, lymphedema, sleep apnea, spinal stenosis, obesity, previous tobacco use and chronic heart failure. Last admission to acute care was Nov/2023 for  worsening edema and cellulitis.  Medication reconciliation completed today during OV. Constipation and reflux resolved since las OV. Lymphoedema and general swelling worsened. She repots some dietary indiscretions, and increasing water  intake per neurology recommendation. Denies SOB or need to use rescue inhaler. Self-adjust bumex back to daily ~ 1 weeks ago.   PLAN  Continue low sodium diet and positive lifestyle modification. Resume bumex 2mg  daily Follow up as indicated by NP  Time spent: 15 minutes  Tymber Stallings Rodriguez-Guzman PharmD, BCPS 10/03/2022 10:27 AM   Current Outpatient Medications:    acetaminophen (TYLENOL) 500 MG tablet, Take 500-1,000 mg by mouth every 6 (six) hours as needed for mild pain or fever., Disp: , Rfl:    albuterol (VENTOLIN HFA) 108 (90 Base) MCG/ACT inhaler, Inhale 1-2 puffs into the lungs every 6 (six) hours as needed for wheezing or shortness of breath. (Patient not taking: Reported on 08/31/2022), Disp: , Rfl:    bumetanide (BUMEX) 2 MG tablet, Take 1 tablet (2 mg total) by mouth daily., Disp: 30 tablet, Rfl: 5   cetirizine (ZYRTEC) 10 MG tablet, Take 1 tablet (10 mg total) by mouth daily. (Patient taking differently: Take 10 mg by mouth daily as needed for allergies.),  Disp: 60 tablet, Rfl: 0   dapagliflozin propanediol (FARXIGA) 10 MG TABS tablet, Take 1 tablet (10 mg total) by mouth daily before breakfast., Disp: 30 tablet, Rfl: 5   metolazone (ZAROXOLYN) 5 MG tablet, Take 0.5 tablets (2.5 mg total) by mouth 2 (two) times a week. (Patient taking differently: Take 2.5 mg by mouth 2 (two) times a week. Tues and sat), Disp: 12 tablet, Rfl: 3   mupirocin ointment (BACTROBAN) 2 %, Apply 1 Application topically daily as needed (wound care)., Disp: , Rfl:    NYSTATIN powder, Apply 1 Application topically 2 (two) times daily as needed (Yeast Skin infection)., Disp: , Rfl:    potassium chloride SA (KLOR-CON M) 20 MEQ tablet, Take 2 tablets (40 mEq total) by mouth  daily., Disp: 180 tablet, Rfl: 3   senna-docusate (SENOKOT-S) 8.6-50 MG tablet, Take 1 tablet by mouth 2 (two) times daily as needed for mild constipation or moderate constipation., Disp: , Rfl:    spironolactone (ALDACTONE) 25 MG tablet, Take 0.5 tablets (12.5 mg total) by mouth daily., Disp: 15 tablet, Rfl: 3

## 2022-10-08 ENCOUNTER — Ambulatory Visit (INDEPENDENT_AMBULATORY_CARE_PROVIDER_SITE_OTHER): Payer: Medicare HMO | Admitting: Internal Medicine

## 2022-10-08 VITALS — BP 120/90 | HR 107 | Resp 20 | Ht 63.0 in | Wt 345.2 lb

## 2022-10-08 DIAGNOSIS — Z6841 Body Mass Index (BMI) 40.0 and over, adult: Secondary | ICD-10-CM

## 2022-10-08 DIAGNOSIS — G4733 Obstructive sleep apnea (adult) (pediatric): Secondary | ICD-10-CM

## 2022-10-08 DIAGNOSIS — I10 Essential (primary) hypertension: Secondary | ICD-10-CM

## 2022-10-08 DIAGNOSIS — Z7189 Other specified counseling: Secondary | ICD-10-CM | POA: Diagnosis not present

## 2022-10-08 NOTE — Progress Notes (Unsigned)
[facility] [place], [place] [address]  Pulmonary Sleep Medicine   Office Visit Note  Patient Name: [name] DOB: 09/30/1955 MRN [id number]    Chief Complaint: Obstructive Sleep Apnea visit  Brief History:  Gracelynne is seen today for an annual follow up visit for APAP@ 10-20 cmH2O. The patient has a 12 year history of sleep apnea. Patient is not using PAP nightly.  The patient feels rested sometimes after sleeping with PAP.  The patient reports benefit from PAP use. Reported sleepiness is  not improved and the Epworth Sleepiness Score is 11 out of 24. The patient does take daily naps. The patient complains of the following: in need of a new machine and supplies. Current machine she is using does not report data and is obsolete for repair . The patient continues to require PAP therapy in order to eliminate sleep apnea. Patient reports having trouble sleeping at night sometimes and sleeps mostly during the day. Patient's son reports she snores and she experiences snoring that wakes her. Patient reports morning headaches, daytime sleepiness and fatigue.  ROS  General: (-) fever, (-) chills, (-) night sweat Nose and Sinuses: (-) nasal stuffiness or itchiness, (-) postnasal drip, (-) nosebleeds, (-) sinus trouble. Mouth and Throat: (-) sore throat, (-) hoarseness. Neck: (-) swollen glands, (-) enlarged thyroid, (-) neck pain. Respiratory: - cough, - shortness of breath, - wheezing. Neurologic: - numbness, - tingling. Psychiatric: - anxiety, - depression   Current Medication: Outpatient Encounter Medications as of 10/08/2022  Medication Sig Note   acetaminophen (TYLENOL) 500 MG tablet Take 500-1,000 mg by mouth every 6 (six) hours as needed for mild pain or fever.    albuterol (VENTOLIN HFA) 108 (90 Base) MCG/ACT inhaler Inhale 1-2 puffs into the lungs every 6 (six) hours as needed for wheezing or shortness of breath. (Patient not taking: Reported on  08/31/2022) 07/23/2022: Hasn't had to use for months   bumetanide (BUMEX) 2 MG tablet Take 1 tablet (2 mg total) by mouth daily.    cetirizine (ZYRTEC) 10 MG tablet Take 1 tablet (10 mg total) by mouth daily. (Patient taking differently: Take 10 mg by mouth daily as needed for allergies.)    dapagliflozin propanediol (FARXIGA) 10 MG TABS tablet Take 1 tablet (10 mg total) by mouth daily before breakfast.    metolazone (ZAROXOLYN) 5 MG tablet Take 0.5 tablets (2.5 mg total) by mouth 2 (two) times a week. (Patient taking differently: Take 2.5 mg by mouth 2 (two) times a week. Tues and sat)    mupirocin ointment (BACTROBAN) 2 % Apply 1 Application topically daily as needed (wound care).    NYSTATIN powder Apply 1 Application topically 2 (two) times daily as needed (Yeast Skin infection).    potassium chloride SA (KLOR-CON M) 20 MEQ tablet Take 2 tablets (40 mEq total) by mouth daily.    senna-docusate (SENOKOT-S) 8.6-50 MG tablet Take 1 tablet by mouth 2 (two) times daily as needed for mild constipation or moderate constipation.    spironolactone (ALDACTONE) 25 MG tablet Take 0.5 tablets (12.5 mg total) by mouth daily.    No facility-administered encounter medications on file as of 10/08/2022.    Surgical History: Past Surgical History:  Procedure Laterality Date   CENTRAL LINE INSERTION N/A 05/11/2022   Procedure: CENTRAL LINE INSERTION;  Surgeon: Katha Cabal, MD;  Location: [facility];  Service: Cardiovascular;  Laterality: N/A;   COLONOSCOPY WITH PROPOFOL N/A 03/12/2018   Procedure: COLONOSCOPY WITH  PROPOFOL;  Surgeon: Manya Silvas, MD;  Location: [facility];  Service: Endoscopy;  Laterality: N/A;   NO PAST SURGERIES      Medical History: Past Medical History:  Diagnosis Date   Arthritis    Asthma    CHF (congestive heart failure) ([place])    Enlarged heart    Hypertension    Lymphedema    Sleep apnea    Spinal stenosis     Family History: Non contributory to the  present illness  Social History: Social History   Socioeconomic History   Marital status: Single    Spouse name: Not on file   Number of children: Not on file   Years of education: Not on file   Highest education level: Not on file  Occupational History   Not on file  Tobacco Use   Smoking status: Former    Packs/day: .25    Types: Cigarettes    Start date: 07/09/1976    Quit date: 07/09/1990    Years since quitting: 32.2   Smokeless tobacco: Current    Types: Snuff  Vaping Use   Vaping Use: Never used  Substance and Sexual Activity   Alcohol use: No   Drug use: No   Sexual activity: Not Currently  Other Topics Concern   Not on file  Social History Narrative   Not on file   Social Determinants of Health   Financial Resource Strain: Not on file  Food Insecurity: No Food Insecurity (05/10/2022)   Hunger Vital Sign    Worried About Running Out of Food in the Last Year: Never true    Ran Out of Food in the Last Year: Never true  Transportation Needs: No Transportation Needs (05/10/2022)   PRAPARE - Hydrologist (Medical): No    Lack of Transportation (Non-Medical): No  Physical Activity: Not on file  Stress: Not on file  Social Connections: Not on file  Intimate Partner Violence: Not At Risk (05/10/2022)   Humiliation, Afraid, Rape, and Kick questionnaire    Fear of Current or Ex-Partner: No    Emotionally Abused: No    Physically Abused: No    Sexually Abused: No    Vital Signs: There were no vitals taken for this visit. There is no height or weight on file to calculate BMI.    Examination: General Appearance: The patient is well-developed, well-nourished, and in no distress. Neck Circumference: 42.5cm Skin: Gross inspection of skin unremarkable. Head: normocephalic, no gross deformities. Eyes: no gross deformities noted. ENT: ears appear grossly normal Neurologic: Alert and oriented. No involuntary movements.  STOP BANG RISK  ASSESSMENT S (snore) Have you been told that you snore?     YES   T (tired) Are you often tired, fatigued, or sleepy during the day?   YES  O (obstruction) Do you stop breathing, choke, or gasp during sleep? YES   P (pressure) Do you have or are you being treated for high blood pressure? YES  B (BMI) Is your body index greater than 35 kg/m? YES   A (age) Are you 36 years old or older? YES   N (neck) Do you have a neck circumference greater than 16 inches?   NO   G (gender) Are you a female? NO   TOTAL STOP/BANG "YES" ANSWERS 6       A STOP-Bang score of 2 or less is considered low risk, and a score of 5 or more is high risk for having  either moderate or severe OSA. For people who score 3 or 4, doctors may need to perform further assessment to determine how likely they are to have OSA.         EPWORTH SLEEPINESS SCALE:  Scale:  (0)= no chance of dozing; (1)= slight chance of dozing; (2)= moderate chance of dozing; (3)= high chance of dozing  Chance  Situtation    Sitting and reading: 3    Watching TV: 2    Sitting Inactive in public: 0    As a passenger in car: 1      Lying down to rest: 1    Sitting and talking: 1    Sitting quielty after lunch: 3    In a car, stopped in traffic: 0   TOTAL SCORE:   11 out of 24    SLEEP STUDIES:  SPLIT (08/14/09) AHI 45.6, min SPO2 84%, recommended CPAP at 16 cmh20   CPAP COMPLIANCE DATA: Not using machine currently          LABS: Recent Results (from the past 2160 hour(s))  Basic metabolic panel     Status: Abnormal   Collection Time: 07/23/22 11:55 AM  Result Value Ref Range   Sodium 137 135 - 145 mmol/L   Potassium 3.7 3.5 - 5.1 mmol/L   Chloride 101 98 - 111 mmol/L   CO2 25 22 - 32 mmol/L   Glucose, Bld 89 70 - 99 mg/dL    Comment: Glucose reference range applies only to samples taken after fasting for at least 8 hours.   BUN 27 (H) 8 - 23 mg/dL   Creatinine, Ser 1.32 (H) 0.44 - 1.00 mg/dL   Calcium 8.4  (L) 8.9 - 10.3 mg/dL   GFR, Estimated 45 (L) >60 mL/min    Comment: (NOTE) Calculated using the CKD-EPI Creatinine Equation (2021)    Anion gap 11 5 - 15    Comment: Performed at [facility], [address] [place], [place] [id number]  Basic metabolic panel     Status: Abnormal   Collection Time: 08/03/22 10:19 AM  Result Value Ref Range   Sodium 137 135 - 145 mmol/L   Potassium 3.8 3.5 - 5.1 mmol/L   Chloride 99 98 - 111 mmol/L   CO2 27 22 - 32 mmol/L   Glucose, Bld 98 70 - 99 mg/dL    Comment: Glucose reference range applies only to samples taken after fasting for at least 8 hours.   BUN 35 (H) 8 - 23 mg/dL   Creatinine, Ser 1.28 (H) 0.44 - 1.00 mg/dL   Calcium 9.4 8.9 - 10.3 mg/dL   GFR, Estimated 46 (L) >60 mL/min    Comment: (NOTE) Calculated using the CKD-EPI Creatinine Equation (2021)    Anion gap 11 5 - 15    Comment: Performed at [facility], [address] [place], [place] [id number]  Basic metabolic panel     Status: Abnormal   Collection Time: 08/31/22 11:14 AM  Result Value Ref Range   Sodium 136 135 - 145 mmol/L   Potassium 3.5 3.5 - 5.1 mmol/L   Chloride 97 (L) 98 - 111 mmol/L   CO2 27 22 - 32 mmol/L   Glucose, Bld 93 70 - 99 mg/dL    Comment: Glucose reference range applies only to samples taken after fasting for at least 8 hours.   BUN 47 (H) 8 - 23 mg/dL   Creatinine, Ser 1.81 (H) 0.44 - 1.00 mg/dL   Calcium  9.5 8.9 - 10.3 mg/dL   GFR, Estimated 30 (L) >60 mL/min    Comment: (NOTE) Calculated using the CKD-EPI Creatinine Equation (2021)    Anion gap 12 5 - 15    Comment: Performed at [facility], [address], [place], [place] [address]    Radiology: No results found.  No results found.  No results found.    Assessment and Plan: Patient Active Problem List   Diagnosis Date Noted   Septic shock 05/11/2022   Cellulitis 05/10/2022   Recurrent cellulitis of lower extremity 05/10/2022   Hypokalemia 04/05/2022    Hypomagnesemia 04/05/2022   Hypotension 04/05/2022   Morbid obesity with BMI of 60.0-69.9, adult 04/03/2022   Bilateral leg pain 04/03/2022   Lymphedema of lower extremity 11/01/2021   Acute on chronic diastolic CHF (congestive heart failure) 10/31/2021   Thrombocytopenia 10/06/2021   Neuropathy 10/05/2021   Chronic kidney disease, stage 3a 10/03/2021   Chronic diastolic CHF (congestive heart failure) 10/03/2021   OSA on CPAP 04/17/2021   CPAP use counseling 04/17/2021   Sepsis due to cellulitis 01/20/2021   DNR (do not resuscitate)    Lactic acidosis    AKI (acute kidney injury) ([place]) in the setting of stage IIIb chronic kidney disease    Bilateral lower leg cellulitis 04/15/2020   Anemia of chronic disease    Cellulitis of left lower extremity 11/03/2018   Sepsis 04/17/2018   Chest pain with high risk for cardiac etiology 02/04/2017   Heart palpitations 02/04/2017   SOB (shortness of breath) on exertion 02/04/2017   OSA (obstructive sleep apnea) 08/04/2016   HTN (hypertension) 08/04/2016   Asthma 08/04/2016   Screening for colon cancer 05/17/2015   Skin rash 12/28/2013   Tobacco use 12/28/2013   Spinal stenosis 07/11/2011   Cataracts, bilateral 05/30/2010   History of colonic polyps 12/28/2009   Lymphedema 02/10/2009   Idiopathic urticaria 02/10/2009   Morbid obesity with BMI of 70 and over, adult 02/10/2009   Other abnormal glucose 02/10/2009   Essential hypertension 02/10/2009    1. OSA (obstructive sleep apnea) The patient does tolerate PAP and reports  benefit from PAP use. She has not been able to use her machine for four months due to malfunction. Her other machine (dispensed in 2020 had a loud noise, was sent in for repair and the repair was unaffordable to the patient and she requested the machine be scrapped.)  She needs a new machine to treat her sleep apnea. We will set her up with a service loaner and work on getting a new machine authorized. The patient was  reminded how to clean equipment and advised to replace supplies routinely. The patient was also counselled on  weight loss. The compliance is unavailable. The AHI is unavailable. Marland Kitchen   obstructive sleep apnea, currently unable to use treatment due to not having functional machine. She will get a loaner machine to establish compliance and will f/u for that.   2. CPAP use counseling CPAP Counseling: had a lengthy discussion with the patient regarding the importance of PAP therapy in management of the sleep apnea. Patient appears to understand the risk factor reduction and also understands the risks associated with untreated sleep apnea. Patient will try to make a good faith effort to remain compliant with therapy. Also instructed the patient on proper cleaning of the device including the water must be changed daily if possible and use of distilled water is preferred. Patient understands that the machine should be regularly cleaned  with appropriate recommended cleaning solutions that do not damage the PAP machine for example given white vinegar and water rinses. Other methods such as ozone treatment may not be as good as these simple methods to achieve cleaning.   3. Morbid obesity with BMI of 60.0-69.9, adult Obesity Counseling: Had a lengthy discussion regarding patients BMI and weight issues. Patient was instructed on portion control as well as increased activity. Also discussed caloric restrictions with trying to maintain intake less than 2000 Kcal. Discussions were made in accordance with the 5As of weight management. Simple actions such as not eating late and if able to, taking a walk is suggested.   4. Primary hypertension Hypertension Counseling:   The following hypertensive lifestyle modification were recommended and discussed:  1. Limiting alcohol intake to less than 1 oz/day of ethanol:(24 oz of beer or 8 oz of wine or 2 oz of 100-proof whiskey). 2. Take baby ASA 81 mg daily. 3. Importance of  regular aerobic exercise and losing weight. 4. Reduce dietary saturated fat and cholesterol intake for overall cardiovascular health. 5. Maintaining adequate dietary potassium, calcium, and magnesium intake. 6. Regular monitoring of the blood pressure. 7. Reduce sodium intake to less than 100 mmol/day (less than 2.3 gm of sodium or less than 6 gm of sodium choride)      General Counseling: I have discussed the findings of the evaluation and examination with Romie Minus.  I have also discussed any further diagnostic evaluation thatmay be needed or ordered today. Latrinda verbalizes understanding of the findings of todays visit. We also reviewed her medications today and discussed drug interactions and side effects including but not limited excessive drowsiness and altered mental states. We also discussed that there is always a risk not just to her but also people around her. she has been encouraged to call the office with any questions or concerns that should arise related to todays visit.  No orders of the defined types were placed in this encounter.       I have personally obtained a history, examined the patient, evaluated laboratory and imaging results, formulated the assessment and plan and placed orders. This patient was seen today by Tressie Ellis, PA-C in collaboration with Dr. Devona Konig.   Allyne Gee, MD [facility] Diplomate ABMS Pulmonary Critical Care Medicine and Sleep Medicine

## 2022-10-08 NOTE — Patient Instructions (Signed)
CPAP and BIPAP Information CPAP and BIPAP are methods that use air pressure to keep your airways open and to help you breathe well. CPAP and BIPAP use different amounts of pressure. Your health care provider will tell you whether CPAP or BIPAP would be more helpful for you. CPAP stands for "continuous positive airway pressure." With CPAP, the amount of pressure stays the same while you breathe in (inhale) and out (exhale). BIPAP stands for "bi-level positive airway pressure." With BIPAP, the amount of pressure will be higher when you inhale and lower when you exhale. This allows you to take larger breaths. CPAP or BIPAP may be used in the hospital, or your health care provider may want you to use it at home. You may need to have a sleep study before your health care provider can order a machine for you to use at home. What are the advantages? CPAP or BIPAP can be helpful if you have: Sleep apnea. Chronic obstructive pulmonary disease (COPD). Heart failure. Medical conditions that cause muscle weakness, including muscular dystrophy or amyotrophic lateral sclerosis (ALS). Other problems that cause breathing to be shallow, weak, abnormal, or difficult. CPAP and BIPAP are most commonly used for obstructive sleep apnea (OSA) to keep the airways from collapsing when the muscles relax during sleep. What are the risks? Generally, this is a safe treatment. However, problems may occur, including: Irritated skin or skin sores if the mask does not fit properly. Dry or stuffy nose or nosebleeds. Dry mouth. Feeling gassy or bloated. Sinus or lung infection if the equipment is not cleaned properly. When should CPAP or BIPAP be used? In most cases, the mask only needs to be worn during sleep. Generally, the mask needs to be worn throughout the night and during any daytime naps. People with certain medical conditions may also need to wear the mask at other times, such as when they are awake. Follow instructions  from your health care provider about when to use the machine. What happens during CPAP or BIPAP?  Both CPAP and BIPAP are provided by a small machine with a flexible plastic tube that attaches to a plastic mask that you wear. Air is blown through the mask into your nose or mouth. The amount of pressure that is used to blow the air can be adjusted on the machine. Your health care provider will set the pressure setting and help you find the best mask for you. Tips for using the mask Because the mask needs to be snug, some people feel trapped or closed-in (claustrophobic) when first using the mask. If you feel this way, you may need to get used to the mask. One way to do this is to hold the mask loosely over your nose or mouth and then gradually apply the mask more snugly. You can also gradually increase the amount of time that you use the mask. Masks are available in various types and sizes. If your mask does not fit well, talk with your health care provider about getting a different one. Some common types of masks include: Full face masks, which fit over the mouth and nose. Nasal masks, which fit over the nose. Nasal pillow or prong masks, which fit into the nostrils. If you are using a mask that fits over your nose and you tend to breathe through your mouth, a chin strap may be applied to help keep your mouth closed. Use a skin barrier to protect your skin as told by your health care provider. Some CPAP   and BIPAP machines have alarms that may sound if the mask comes off or develops a leak. If you have trouble with the mask, it is very important that you talk with your health care provider about finding a way to make the mask easier to tolerate. Do not stop using the mask. There could be a negative impact on your health if you stop using the mask. Tips for using the machine Place your CPAP or BIPAP machine on a secure table or stand near an electrical outlet. Know where the on/off switch is on the  machine. Follow instructions from your health care provider about how to set the pressure on your machine and when you should use it. Do not eat or drink while the CPAP or BIPAP machine is on. Food or fluids could get pushed into your lungs by the pressure of the CPAP or BIPAP. For home use, CPAP and BIPAP machines can be rented or purchased through home health care companies. Many different brands of machines are available. Renting a machine before purchasing may help you find out which particular machine works well for you. Your health insurance company may also decide which machine you may get. Keep the CPAP or BIPAP machine and attachments clean. Ask your health care provider for specific instructions. Check the humidifier if you have a dry stuffy nose or nosebleeds. Make sure it is working correctly. Follow these instructions at home: Take over-the-counter and prescription medicines only as told by your health care provider. Ask if you can take sinus medicine if your sinuses are blocked. Do not use any products that contain nicotine or tobacco. These products include cigarettes, chewing tobacco, and vaping devices, such as e-cigarettes. If you need help quitting, ask your health care provider. Keep all follow-up visits. This is important. Contact a health care provider if: You have redness or pressure sores on your head, face, mouth, or nose from the mask or head gear. You have trouble using the CPAP or BIPAP machine. You cannot tolerate wearing the CPAP or BIPAP mask. Someone tells you that you snore even when wearing your CPAP or BIPAP. Get help right away if: You have trouble breathing. You feel confused. Summary CPAP and BIPAP are methods that use air pressure to keep your airways open and to help you breathe well. If you have trouble with the mask, it is very important that you talk with your health care provider about finding a way to make the mask easier to tolerate. Do not stop using  the mask. There could be a negative impact to your health if you stop using the mask. Follow instructions from your health care provider about when to use the machine. This information is not intended to replace advice given to you by your health care provider. Make sure you discuss any questions you have with your health care provider. Document Revised: 02/01/2021 Document Reviewed: 06/03/2020 Elsevier Patient Education  2023 [employer]  

## 2022-10-25 ENCOUNTER — Encounter: Payer: Medicare HMO | Attending: Physician Assistant | Admitting: Physician Assistant

## 2022-10-25 DIAGNOSIS — L97821 Non-pressure chronic ulcer of other part of left lower leg limited to breakdown of skin: Secondary | ICD-10-CM | POA: Diagnosis not present

## 2022-10-25 DIAGNOSIS — L97811 Non-pressure chronic ulcer of other part of right lower leg limited to breakdown of skin: Secondary | ICD-10-CM | POA: Diagnosis present

## 2022-10-25 DIAGNOSIS — Q820 Hereditary lymphedema: Secondary | ICD-10-CM | POA: Diagnosis not present

## 2022-10-25 NOTE — Progress Notes (Addendum)
[name], [name] ([id number]) [id number] Page 1 of 13 Visit Report for 10/25/2022 Chief Complaint Document Details Patient Name: Date of Service: [name], [name] 10/25/2022 10:45 A M Medical Record Number: [id number] Patient Account Number: [id number] Date of Birth/Sex: Treating RN: [birth year] (67 y.o. [name] Primary Care Provider: [name] Crutch Other Clinician: Referring Provider: Treating Provider/Extender: Wynona Dove in Treatment: 34 Information Obtained from: Patient Chief Complaint Bilateral LE lymphedema with Left LE Ulcers Electronic Signature(s) Signed: 10/25/2022 11:31:12 AM By: Allen Derry PA-C Entered By: Allen Derry on 10/25/2022 11:31:12 -------------------------------------------------------------------------------- HPI Details Patient Name: Date of Service: Elizabeth, [name] 10/25/2022 10:45 A M Medical Record Number: [id number] Patient Account Number: [id number] Date of Birth/Sex: Treating RN: 1955/08/29 (67 y.o. [name] Primary Care Provider: [name] Crutch Other Clinician: Referring Provider: Treating Provider/Extender: Wynona Dove in Treatment: 76 History of Present Illness HPI Description: The patient is a 67 year old female with history of hypertension and a long-standing history of bilateral lower extremity lymphedema (first presented on 4/2) . She has had open ulcers in the past which have always responded to compression therapy. She had briefly been to a lymphedema clinic in the past which helped her at the time. this time around she stopped treatment of her lymphedema pumps approximately 2 weeks ago because of some pain in the knees and then noticed the right leg getting worse. She was seen by her PCP who put her on clindamycin 4 times a day 2 days ago. The patient has seen [facility] and Dr. Gilda Crease had seen her last year where a vascular study including venous and arterial duplex  studies were within normal limits. he had recommended compression stockings and lymphedema pumps and the patient has been using this in about 2 weeks ago. She is known to be diabetic but in the past few time she's gone to her primary care doctor her hemoglobin A1c has been normal. 02/11/2015 - after her last visit she took my advice and went to the ER regarding the progressive cellulitis of her right lower extremity and she was admitted between July 17 and 22nd. She received IV antibiotics and then was sent home on a course of steroid-induced and oral antibiotics. She has improved much since then. 02/17/2015 -- she has been doing fine and the weeping of her legs has remarkably gone down. She has no fresh issues. READMISSION 01/15/18 This patient was given this clinic before most recently in 2016 seen by Dr. Meyer Russel. She has massive bilateral lymphedema and over the last 2 months this had weeping edema out of the left leg. She has compression pumps but her compliance with these has been minimal. She has [facility] they've been using TCA/ABDs/kerlix under an Ace wrap.she has had recent problems with cellulitis. She was apparently seen in the ER and 12/23/17 and given clindamycin. She was then followed by her primary doctor and given doxycycline and Keflex. The pain seems to have settled down. In April 2018 the patient had arterial studies done at  [facility]. This showed triphasic waveforms throughout the right leg and mostly triphasic waveforms on the left except for monophasic at the posterior tibial artery distally. She was not felt to have evidence of right lower extremity arterial stenosis or significant problems on the left side. 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 She also had a right lower extremity venous Doppler in January 2018 this was limited by the patient's body habitus  and lymphedema. Most of the proximal veins were not visualized The  patient presents with an area of denuded skin on the anterior medial part of the left calf. There is weeping edema fluid here. 01/22/18; the patient has somewhat better edema control using her compression pumps twice a day and as a result she has much better epithelialization on the left anterior calf area. Only a small open area remains. 01/29/18; the patient has been compliant with her compression pumps. Both the areas on her calf that healed. The remaining area on the left anterior leg is fully epithelialized Readmission: 02/20/2019 upon evaluation today patient presents for reevaluation due to issues that she is having with the bilateral lower extremities. She actually has wounds open on both legs. On the right she has an area in the crease of her leg on the right around the knee region which is actually draining quite a bit and actually has some fungal type appearance to it. She has been on nystatin powder that seems to have helped to some degree. In regard to the left lower extremity this is actually in the lower portion of her leg closer to the ankle and again is continuing to drain as well unfortunately. There does not appear to be any signs of active infection at this time which is good news. No fevers, chills, nausea, vomiting, or diarrhea. She tells me that since she was seen last year she is actually [name], [name] ([id number]) [id number] Page 2 of 13 been doing quite well for the most part with regard to her lower extremities. Unfortunately she now is experiencing a little bit more drainage at this time. She is concerned about getting this under control so that it does not get significantly worse. 02/27/2019 on evaluation today patient appears to be doing somewhat better in regard to her bilateral lower extremity wounds. She has been tolerating the dressing changes without complication. Fortunately there is no signs of active infection at this point. No fevers,  chills, nausea, vomiting, or diarrhea. She did get her dressing supplies which is excellent news she was extremely excited to get these. She also got paperwork from [employer] for their financial assistance program where they may be able to help her out in the future if needed with supplies at discounted prices. 03/06/2019 on evaluation today patient appears to be doing a little worse with regard to both areas of weeping on her bilateral lower extremities. This is around the right medial knee and just above the left ankle. With that being said she is unfortunately not doing as well as I would like to see. I feel like she may need to potentially go see someone at the lymphedema clinic as the wraps that she needs or even beyond what we can do here at the wound care center. She really does not have wounds she just has open areas of weeping that are causing some difficulty for her. Subsequently because of this and the moisture I am concerned about the potential for infection I am going to likely give her a prophylactic antibiotic today, Keflex, just to be on the safe side. Nonetheless again there is no obvious signs of active infection at this time. 03/13/2019 on evaluation today patient appears to be doing well with regard to her bilateral lower extremities where she has been weeping compared to even last week's evaluation. I see some areas of new skin growth which is excellent and overall I am very pleased with how things seem to be progressing.  No fevers, chills, nausea, vomiting, or diarrhea. 03/20/2019 on evaluation today patient unfortunately is continuing to have issues with significant edema of the left lower extremity. Her right side seems to be doing much better. Unfortunately her left side is showing increased weeping of the lower portion of her leg. This is quite unfortunate obviously we were hoping to get her into the lymphedema clinic they really do not seem to when I see her how if she is draining.  Despite the fact this is really not wound related but more lymphedema weeping related. Nonetheless I do not know that this can be helpful for her to even go for that appointment since again I am not sure there is much that they would actually do at this point. We may need to try a 4 layer compression wrap as best we can on her leg. She is on the Augmentin currently although I am still concerned about whether or not there could be potentially something going on infection wise I would obtain a culture though I understand is not the best being that is a surface culture I just 1 to make sure I do not seem to be missing anything. 03/27/2019 on evaluation today patient appears to be doing much better in regard to the left lower extremity compared to last week. Last week she had tremendous weeping which I think was subsequent to infection now she seems to be doing much better and very pleased. This is not completely healed but there is a lot of new skin growth and it has dried out quite a bit. Overall I think that we are doing well with how things are moving along at this time. No fevers, chills, nausea, vomiting, or diarrhea. 04/03/2019 evaluation today patient appears to be doing a little worse this week compared to last time I saw her. I think this may be due to the fact that she is having issues with not being able to sleep in her bed at least not until last night. She is therefore been in a lift chair and subsequently has also had issues with not been able to use her pumps since she could not get in bed. With that being said the patient overall seems to be doing okay I do think I may want extend the antibiotic for a little bit longer at least until we can see if her edema and her weeping gets better and if it is then obviously I can always discontinue the antibiotics as of next week however I want her to continue to have it over the next week. 04/10/2019 on evaluation today patient unfortunately is still  doing poorly with regard to her left lower extremity. Her right is all things considering doing fairly well. On the left however she continues to have spreading of the area of infection and weeping which appears to be even a larger surface area than noted last week. She did have a positive culture for Pseudomonas in particular which seems to have been of concern she still has green/yellow discharge consistent with Pseudomonas and subsequently a tremendous amount of it. This has me obviously still concerned about the infection not really clearing up despite the fact that on culture it appears the Cipro should have been a good option for treating this. I think she may at this point need IV antibiotics since things are not doing better I do not want to get worse and cause sepsis. She is in agreement with the plan and believes as well that  she likely does need to go to the hospital for IV vancomycin. Or something of the like depending on what the recommendation is from the ER. 04/17/2019 on evaluation today patient appears to be doing excellent in regard to her lower extremity on the left. She was in the hospital for several days from when I sent her last we saw her until just this past Tuesday. Fortunately her drainage is significantly improved and in fact is mostly clear. There is just a couple small areas that may still drain a little bit she states that the [facility] they prescribed for her at discharge she went picked up from pharmacy and got home but has not been able to find it since. She is looked everywhere. She is wondering if I will replace that for her today I will be more than happy to do that. 05/01/2019 on evaluation today patient actually appears to be doing quite well with regard to her lower extremities. She occasionally is having areas that will leak and then heal up mainly when a piece of the fibrotic skin pops off but fortunately she is not having any signs of active infection at this time.  Overall she also really does not have any obvious weeping at this time. I do believe however she really needs some compression wraps and I think this may be a good time to get her back to the lymphedema clinic. 05/11/2019 on evaluation today patient actually appears to be doing quite well with regard to her bilateral lower extremities. She occasionally will have a small area that we per another but in general seems to be completely healed which is great news. Overall very pleased with how everything seems to be progressing. She does have her appointment with lymphedema clinic on November 18. 05/25/2019 on evaluation today patient appears to be doing well with regard to her left lower extremity. I am very pleased in this regard. In regard to her right leg this actually did start draining more I think it is mainly due to the fact that her leg is more swollen. I am not seeing any obvious signs of infection at this time although that is definitely something were obviously acutely aware of simply due to the fact that she had an issue not too far back with exactly this issue. Nonetheless I do feel like that lymphedema clinic would still be beneficial for her. I explained obviously if they are not able to do anything treatment wise on the right leg we could at least have them treat her left leg and then proceed from there. The patient is really in agreement with that plan. If they are able to do both as the drainage slows down that I would be happy to let them handle both. 06/01/2019 on evaluation today patient unfortunately appears to be doing worse with regard to her right lower extremity. The left lower extremity is still maintaining at this point. Unfortunately she has been having significantly increased pain over the past several days and has been experiencing as well increased swelling of the right lower extremity. I really do not know that I am seeing anything that appears to be obvious for infection at  this point to be peripherally honest. With that being said the patient does seem to be having much more swelling that she is even experienced in the past and coupled with increased pain in her hip as well I am concerned that again she could potentially have a DVT although I am not 100% sure  of this. I think it something that may need to be checked out. We discussed the possibility of sending her for a DVT study through the hospital but unfortunately transportation is an issue if she does have a DVT I do not want her to wait days to be able to get in for that test however if she has this scheduled as an outpatient that is as fast that she will be able to get the test scheduled for transportation purposes. That will also fall on Thanksgiving so subsequently she did actually be looking at either Friday or even next week before we would know anything back from this. That is much too long in my opinion. Subsequent to the amount of discomfort she is experiencing the patient is actually okay with going to the ER for evaluation today. 06/12/2019 on evaluation today patient actually appears to be doing significantly better compared to last time I saw her. Following when I last saw her she was actually in the hospital from that Monday until the following Sunday almost 1 full week. She actually was placed on Keflex in the hospital following the time for her to be discharged and Dr. Joylene Draft has recommended 2 times a day dosing of the Keflex for the next year in order to help with more prophylactic/preventative measures with regard to her developing cellulitis. Overall I think this sounds like an excellent plan. The patient unfortunately is good to have trouble being treated at lymphedema clinic due to the fact that she really cannot get up on the bed that they have there. They also state that they cannot manage her as long as she has anything draining at this point. Obviously that is somewhat unfortunate as she  does need help with edema control but nonetheless we will have to do what we can for her outside of it sounds like the lymphedema clinic scenario at this point. 06/19/2019 on evaluation today patient appears to be doing fairly well with regard to her bilateral lower extremities. She is not nearly as swollen and shows no signs of infection at this point. There is no evidence of cellulitis whatsoever. She also has no open wounds or draining at this point which is also good news. No fever chills noted. She seems to be in very good spirits and in fact appears to be doing quite well. READMISSION 11/27/2019 ALBIRDA, SHIEL ([id number]) [id number] Page 3 of 13 This is a 67 year old woman that we have had in this clinic several times before including 2015, 16 and 19 and then most recently from 03/20/2019 through 06/19/2019 with bilateral lower extremity lymphedema. She has had previous arterial and reflux studies done years ago which were not all that remarkable. 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 She does have a positive family history and she had large legs starting may be in her 2s. She was recently in hospital from 10/20/2019 through 10/28/2019 with right leg cellulitis. She was given Ancef and clindamycin and then Zosyn when a culture showed Pseudomonas. At that time there was purulent drainage. She was followed by infectious disease Dr Joylene Draft. The patient is now back at home. She has noted increased swelling in the right and no drainage in her right leg mostly on the posterior medial aspect in the calf area. She has not had pain or fever. She has literally been improved lysing above dressings because her at the area of this is far too large for standard compression. She  has been wrapping the areas with sheets to resorptive pads. She is found these helped somewhat. She does have an appointment with the lymphedema  clinic in [place] in late June. Past medical history includes bilateral lymphedema, hypertension, obstructive sleep apnea with CPAP. Recent hospitalization with apparently Pseudomonas cellulitis of the right lower leg 12/15/2019 upon evaluation today patient appears to be doing a little bit worse in regard to her right lower extremity. Unfortunately she is having more weeping down in the lower portion of her leg. Fortunately there is no signs of active infection at this time. No fever chills noted. The patient states she is not having increased pain except for when she attempted to use the lymphedema pumps unfortunately she states that she did have pain when she did this. Otherwise we been using absorptive dressings of one type or another she is using diapers at home and then subsequently Ace wraps. In regard to the barrier cream we have discussed the possibility of derma cloud which she would like to try I do not have a problem with that. 12/22/2019 upon evaluation today patient actually appears to be doing better in regard to her leg ulcers at this point. Fortunately there does not appear to be any signs of active infection which is great news and I am extremely pleased with where things are progressing at this time. There is no sign of active infection currently. The patient is very pleased to see things doing so well. 12/29/2019 upon evaluation today patient appears to be doing a little bit better in regard to her weeping in general over her lower extremities. She does have some signs of mild erythema little bit more than what I noted last week or rather last visit. Nonetheless I think that my threshold for switching her antibiotics from Keflex to something else is very low at this point considering that she has had such severe infections in the past that seem to come almost out of nowhere. There is a little erythema and warmth noted of the lower portion of her leg compared to the upper which also  makes me want to go ahead and address things more rapidly at this point. Likely I would switch out the Keflex for something like Levaquin ideally. 7/16; patient with severe bilateral lymphedema. She has superficial wounds albeit almost circumferential now on the left lateral lower leg. This may be new from last time. Small area on the right anterior lower leg and then another area on the right medial lower leg and of pannus fold. She has been using various absorptive garments. She states she is using her compression pumps once a day occasionally twice. Culture from her last visit here was negative 01/29/2020 on evaluation today patient appears to be doing excellent at this point in regard to her legs with regard to infection I see no signs of active infection at this point. She still does have unfortunately areas of weeping this is minimal on the right now her left is actually significantly worse although I do not think it is as bad as last week with Dr. Leanord Hawking saw her. She has been trying to pump and elevate her legs is much as possible. She has previously been on the Keflex and in the past for prevention that seems to do fairly well and likely can extend that today. 02/04/2020 on evaluation today patient appears to be doing better in regard to her legs bilaterally. Fortunately there is no signs of active infection at this time which  is great news and overall she has less weeping on the left compared to the right and there is several spots where she is pretty much sealed up with no draining regions. Overall very pleased in this regard. 02/19/2020 on evaluation today patient appears to be doing very well in regard to her wounds currently. Fortunately there is no evidence of active infection overall very pleased with where things stand. She is significantly improved in regard to her edema I am extremely pleased in this regard she tells me that the popping no longer hurts and in fact she actually looks  forward to it. 03/04/2020 on evaluation today patient appears to be doing excellent in regard to her lower extremities. Fortunately there is no signs of active infection at this time. No fevers, chills, nausea, vomiting, or diarrhea. 03/25/2020 on evaluation today patient appears to be doing a little bit more poorly in regard to her legs at this point. She tells me that she is still continue to have issues with drainage and this has been a little bit worse she was getting ready to start taking the Keflex again but wanted to see me first. Fortunately there is no signs of active infection at this time. No fevers, chills, nausea, vomiting, or diarrhea. 04/15/2020 upon evaluation today patient appears to be doing somewhat poorly in regard to her right leg. She tells me she has been having more pain she has been taking the Keflex that was previously prescribed unfortunately that just does not seem to help with this. She was hoping that the pain on her right was actually coming from the fact that she was having issues with her wrap having gotten caught in her recliner. With that being said she tells me that she knew something was not right. Currently her right leg is warm to touch along with being erythematous all the way up to around at least mid thigh as far as I can see. The left leg does not appear to be doing that badly though there is increased weeping around the ankle region. 05/06/2020 on evaluation today patient appears to be doing much better than last time I saw her. She did go to the hospital where she was admitted for 2 days and treated with antibiotic therapy. She was discharged with antibiotics as well and has done extremely well. I am extremely pleased with where things stand today. There is no signs of active infection at this time which is great news. 05/27/2020 upon evaluation today patient appears to be doing well with regard to her lower extremities bilaterally. She has just a very tiny  area on the right leg which is opening on the left leg she is significantly improved though she still has several areas that do appear to be open this is minimal compared to what is been in the past. In general I am extremely pleased with where things stand today. The patient does tell me she is not been using her pumps quite as much as she should be. I do believe that is 1 area she can definitely work on. She has had a lot going on including a Covid exposure and apparently also a outbreak of likely shingles. 06/24/2020 upon evaluation today patient appears to be doing well with regard to her legs in general although the left leg unfortunately is showing some signs of erythema she does have a little bit of increased weeping and to be honest I am concerned about infection here. I discussed that with her today  and I think that we may need to address this sooner rather than later she has been taking Keflex she is not really certain that is been making a big improvement however. No fevers, chills, nausea, vomiting, or diarrhea. 06/30/2020 upon evaluation today patient's legs actually seem to be doing better in my opinion as compared to where they were last week. Fortunately there does not appear to be any signs of active infection. Her culture showed multiple organisms nothing predominate. With that being said the Levaquin seems to have done well I think she has improved since I last saw her as well. 07/14/2020 upon evaluation today patient appears to be doing actually better in regard to her lower extremities in my opinion. She has been tolerating the dressing changes without complication. Fortunately there is no signs of active infection at this time. 08/04/2020 upon evaluation today patient appears to be doing excellent in regard to her leg ulcers. Fortunately she has very little that is open at this point. This is great news. In fact I think that the Goldbond medicated powder has been excellent for her.  It seems to have done the trick where we had tried several other things without as much success. Fortunately there is no evidence of active infection at this time. No fevers, chills, nausea, vomiting, or diarrhea. 09/01/2020 upon evaluation today patient appears to be doing a little bit more poorly than the last time I saw her. She tells me right now that she has been having a lot of drainage compared to where things were previous which has unfortunately led to more irritation as well. She is concerned that this is leading to infection. Again based on what I am seeing today as well I am also concerned of the same to be honest. 09/15/2020 upon evaluation today patient appears to be doing well with regard to her legs compared to what I saw previous. Fortunately there does not appear to be any evidence of active infection at this time which is great news. At least not as badly as what it was previous. With that being said I do believe that the YESIKA, RISPOLI ([id number]) [id number] Page 4 of 13 patient does need to have an extension of the antibiotics. This I believe will actually help her more in the way of making sure this stays under control and does not worsen. That was the Bactrim DS. Readmission: 08/01/2021 upon evaluation today patient appears to be doing actually pretty well all things considered. Is actually been almost a year since have seen her last March. Fortunately I do not see any evidence of active infection locally nor systemically at this point. She does have a couple open areas on the left leg the right leg appears to be doing quite well. In general she has been in the hospital 3 times since I saw her a year ago all 4 cellulitis type issues except for the last 1 which was actually more related to congestive heart failure for that reason she is not using her lymphedema pumps at this point and that is probably the best idea. 08/08/2021 upon evaluation today  patient appears to be doing well with regard to her legs all things considered her left leg may be a little bit more swollen based on what I see today. Fortunately I do not see any signs of active infection locally nor systemically at this time which is great news. No fevers, chills, nausea, vomiting, or diarrhea. 08/15/2021 upon evaluation today patient actually appears to  be doing excellent in regard to her wounds. She has been tolerating the dressing changes without complication. Fortunately I see no evidence of infection currently which is great news. No fevers, chills, nausea, vomiting, or diarrhea. 08/29/2021 upon evaluation patient appears to be doing excellent she in fact is almost completely healed. I am actually very pleased with where we stand today and I think that she is making wonderful progress she just has a small area of weeping on the right lateral leg everything else is pretty much completely healed which is also. 09/05/2021 upon evaluation today patient appears to be doing well with regard to her legs. She does have a small area of weeping on the right leg and there is a small area on the left leg as well. It is potentially something that may need to be addressed in the future more specifically but right now I think that there may have just been a little drainage here on the left. With all that being said I think that this still does not appear to be infected and overall I feel like she is doing quite well. That something we always have to keep a very close eye on as she can change very rapidly from okay to not okay. 09/12/2021 upon evaluation today patient appears to be doing a little bit worse in regard to her leg and actually somewhat concerned about the possibility of infection here. I discussed that with the patient. For that reason I am going to go ahead and see about getting her set up for a repeat prescription for the Bactrim. She is in agreement with that plan. 3/14; patient  presents for follow-up. She has been using silver alginate with dressing changes. She is still taking Bactrim prescribed at last clinic visit. She has no issues or complaints today. She has lymphedema pumps however does not use these. 09/26/2020 upon evaluation today patient appears to actually be doing well in regard to her right leg I am pleased in that regard. Unfortunately she has new areas on her left leg that are open at this point that I do need to be addressed. I am also concerned about the warmth around one of the wounds on the more anterior side. I think this is something that we may need to culture today potentially requiring antibiotics going forward. 10/03/2021 upon evaluation today patient unfortunately is not doing nearly as well as she was even last week with her wounds. She is having some issues here with increased cellulitis of the left lower extremity unfortunately. I did prescribe Augmentin for her eczema prescribed last week unfortunately she never actually received this prescription. The pharmacy stated they never got it. We double checked with them today they still did not receive it. I am not sure what happened in that regard. With that being said the patient is in good spirits today she does not appear to be as sick as where she normally is when the cellulitis gets significantly worse as it has in the past. Nonetheless the leg is much more red not just around the ankle where I was seeing at last week on Tuesday but rather now this is going all the way up to almost her knee and is definitely hot to touch compared to the right leg. Nonetheless I feel like she does need to go to the ER for likely IV antibiotics. 10-10-2021 upon evaluation today patient appears to be doing well with regard to her wound. She has been tolerating the dressing changes  that appears to be doing much better. After I saw her last week she did go to the ER they admitted her to the hospital and gave her IV  antibiotics she fortunately is doing significantly better. Overall I am extremely pleased with where things stand today. 10-17-2021 upon evaluation today patient appears to be doing well with regard to her wound. In fact this is looking better and her leg is looking better but she still does have some areas here of erythema. We will continue to address this as soon as possible in my opinion. I do believe she may require some debridement and what appears to be a more defined wound on her left leg at this point. 10-24-2021 upon evaluation patient is definitely showing signs of improvement which is great news. I do not see any evidence of active infection locally or systemically which is great news as well. No fevers, chills, nausea, vomiting, or diarrhea. 10-31-2021. Upon evaluation today patient's leg actually feels better from an infection and wound care perspective. I am actually very pleased in this regard. Unfortunately the biggest issue that I see at this time is that the patient is having a significant issue with her breathing. I did put her on the pulse ox machine to check her oxygen saturation and it was pretty much at 100% which is good but she tells me that when she is walking this is much more difficult for her. She also tells me that when she is trying to bend over to perform her dressing changes she is so short of breath due to the swelling and fluid in her abdominal area that she is just not been able to do this. She is tearful and very concerned today to be honest. I am truly sorry to see her this way I know she is really having a hard time at the moment. 11-14-2021 upon evaluation patient actually appears to be doing significantly better compared to when I last saw her. She was actually admitted to the hospital I believe this for 6 days total after I sent her on 10-31-2021. Subsequently they actually pulled off greater than 50 pounds of fluid she tells me she feels significantly better her  legs are not as tight she actually has some play in the tissue and it is obvious that she is doing much better just from a mobility standpoint as well as a breathing standpoint. Overall I am extremely happy for her and how she is doing the leg also looks much better on the left. 11-21-2021 upon evaluation today patient appears to be doing well although it does appear that she is going require some sharp debridement the wound is very dry this is the opposite of what its been she was having so much weeping that it was staying extremely wet. Nonetheless we do need to see about going ahead and debriding the wound today. 11-28-2021 upon evaluation today patient appears to be doing well with regard to her wound I definitely see signs of things continuing to improve which is great news. I do not see any evidence of active infection locally or systemically also great news. 12-05-2021 upon evaluation today patient appears to be doing well with regard to her wound. She has been tolerating the dressing changes. Fortunately there does not appear to be any signs of active infection locally or systemically at this time. No fevers, chills, nausea, vomiting, or diarrhea. 12-12-2021 upon evaluation patient appears to be doing well with regard to her wound this is  actually measuring smaller and looking much better. Fortunately I do not see any signs of active infection locally or systemically at this time which is excellent news. 6/13; small wound area in the folds of her lymphedema just above the left ankle. Fortunately the area looks quite good. She has been using silver alginate ABDs and an Ace wrap which she is able to change her self. She is not using her compression pumps out of fear that this could contribute to heart failure. 12-26-2021 upon evaluation patient's wounds are actually showing signs of excellent improvement. I am very pleased with where things stand. Overall I think that she has been making excellent  progress and I think we are very close to complete resolution which is great news. 01-11-2022 upon evaluation today patient appears to be doing well currently in regard to her legs in general although on the left leg she does have 1 area that still irritated and inflamed on the anterior portion of her leg. Fortunately I do not see any signs of systemic infection locally there might be some infection going on KIMAYA, WHITLATCH ([id number]) [id number] Page 5 of 13 here however which is my biggest concern. Fortunately I do not see any evidence though of this spreading to any other location. 01-16-2022 upon evaluation today patient has actually showing signs of excellent improvement. Fortunately I do not see any evidence of infection locally or systemically which is great news and overall I am very pleased with where we stand at this point. 01-25-2022 upon evaluation today patient actually appears to be doing decently well in regard to her legs. She has been tolerating the dressing changes without complication. Fortunately there does not appear to be any evidence of active infection locally or systemically which is great news and overall I am extremely pleased with where we stand at this point. 02-13-2022 upon evaluation today patient appears to be doing well currently in regard to her wound which is actually showing signs of significant improvement. Fortunately I do not see any evidence of active infection locally or systemically at this time. No fevers, chills, nausea, vomiting, or diarrhea. 02-20-2022 upon evaluation today patient appears to be doing well currently in regard to her wound. She has been tolerating the dressing changes without complication. Fortunately there does not appear to be any signs of active infection locally or systemically at this time. No fevers, chills, nausea, vomiting, or diarrhea. 02-27-2022 upon evaluation today patient appears to be doing excellent in  regard to her wounds on the leg. She has been tolerating the dressing changes without complication this is very close to complete resolution. Fortunately I do not see any signs of infection locally or systemically at this time which is great news. No fevers, chills, nausea, vomiting, or diarrhea. 03-06-2022 upon evaluation today patient appears to be doing well currently in regard to her wound in general. She has been tolerating the dressing changes without complication. Fortunately I do not see any evidence of active infection locally or systemically at this time which is great news. I am concerned a little bit however about not really her wounds but the second toe right foot where there appears to be some cellulitis after she dropped a frying pan on this about 2 weeks ago. She tells me its been itching and burning and I do think that it is possible she may have an infection based on what we are seeing currently. 03-20-2022 upon evaluation today patient actually appears to be doing much better at  this point. Fortunately there does not appear to be any signs of active infection locally or systemically at this time which is great news. No fevers, chills, nausea, vomiting, or diarrhea. 03-27-2022 upon evaluation today patient appears to be doing somewhat poorly in regard to her legs compared to previous. Her right leg has a couple areas that are open and she is very warm and painful to touch around the lateral portion of the ankle area. Left leg is showing signs of little bit more drainage and weeping as well I think that this is probably due to the fact that she is swelling a lot more that she has been in the past. Fortunately I do not see any signs of active infection systemically though locally there is definitely some issues here. 04-03-2022 upon evaluation today patient appears to be doing poorly in regard to her wounds on the legs unfortunately. She also seems to have more significant cellulitis that  has ensued. Unfortunately she has been on doxycycline initially it seemed to be helping but as of this week that is no longer the case. It started getting worse last week and with the reinitiation of the doxycycline nothing has improved. Often when she gets like this IV antibiotics are necessary. 04-19-2022 upon evaluation today patient appears to be doing well currently in regard to her legs from an infection standpoint. She did get IV vancomycin in the hospital and states that her legs did get better when she left she was no longer weeping but she had been in the bed for 4 days straight. With that being said since coming home her legs have begun to weep again the original wound that we were dealing with has healed she does have some weeping over both lower extremities however which is still of concern. 05-03-2022 upon evaluation today patient appears to be doing well currently in regard to her left leg which I feel like is a little bit better unfortunately the right leg is still draining significantly. Fortunately I do not see any signs of infection locally or systemically at this time. No fevers, chills, nausea, vomiting, or diarrhea. 05-10-2022 unfortunately patient actually showed signs of increasing erythema today. I believe that the cellulitis never completely resolved from when she was in the hospital last. Again she has been on doxycycline previously by myself I gave her Keflex more recently as an ongoing prescription to try to prevent infection but again I do not think this is strong enough to be able to get this under control. I really feel like that she is probably can need IV antibiotics and a good period of time in the hospital to be able to keep her legs elevated and not having to get around to do a whole lot in order to allow this to resolve. The patient voiced understanding. 05-24-2022 upon evaluation today patient appears to be doing excellent in regard to her legs. She has been  tolerating the dressing changes without complication. Fortunately there does not appear to be any signs of infection at this time which is great news she has been on cefadroxil since she was discharged from the hospital. The day that we sent her she actually tells me that she ended up in septic shock that day and coded. When she first showed up they did not notice it on the lab work but said by the end of the day she had already undergone this and they had had to address the issue obviously when she coded. She tells  me that likely she would not be here today if she had not gone to the hospital that day. I explained to her that I do believe that was a God thing and it was just the perfect timing that he had for her at that time. 06-14-2022 upon evaluation today patient appears to be doing well currently in regard to her legs. In general I think she is making good progress and I do not see any signs of infection at this time which is great news. No fevers, chills, nausea, vomiting, or diarrhea. 06-28-2022 upon evaluation today patient appears to be doing excellent in regard to her legs. She is actually showing signs of excellent improvement I do not see any evidence of infection locally nor systemically at this time. No fevers, chills, nausea, vomiting, or diarrhea. 07-26-2022 upon eval evaluation today patient appears to be doing well currently in regard to her wound. She has been tolerating the dressing changes without complication. Fortunately I do not see any major issues here she has a couple areas of weeping mainly on the right leg the left leg seems to be doing pretty well. 08-23-2021 upon evaluation today patient appears to be doing excellent in regard to her legs. These are showing signs of wonderful improvement I am actually very pleased with where we stand I think she is doing the best that I have ever seen her. Fortunately I do not see any evidence of infection locally or systemically which is  great news. 09-27-2022 upon evaluation today patient appears to be doing well she has no open wounds at this time her lymphedema seems to be doing quite well at this point. Fortunately I do not see any signs of active infection locally nor systemically which is great news and overall her swelling though up a little bit is not nearly as bad as it has been at times. No evidence of cellulitis currently. 10-25-2022 upon evaluation patient appears to be doing excellent in regard to her wounds currently. I do not see anything open that this is great her leg still has the bilateral lymphedema but again this is not nearly as severe as what it has been in the past I think she is doing really well. She is using the pumps when she can although she does tell me there is been some issue there with her arthritis at times not being able to do what she needs to do. Electronic Signature(s) Signed: 10/26/2022 1:26:02 PM By: Allen Derry PA-C Entered By: Allen Derry on 10/26/2022 13:26:02 [name] ([id number]) [id number] Page 6 of 13 -------------------------------------------------------------------------------- Physical Exam Details Patient Name: Date of Service: [name], [name] 10/25/2022 10:45 A M Medical Record Number: [id number] Patient Account Number: [id number] Date of Birth/Sex: Treating RN: [birth year] (67 y.o. [name] Primary Care Provider: [name] Crutch Other Clinician: Referring Provider: Treating Provider/Extender: Wynona Dove in Treatment: 97 Constitutional Well-nourished and well-hydrated in no acute distress. Respiratory normal breathing without difficulty. Psychiatric this patient is able to make decisions and demonstrates good insight into disease process. Alert and Oriented x 3. pleasant and cooperative. Notes Upon inspection patient's wound bed actually showed signs of good granulation epithelization at this point. Fortunately  I do not see any signs of active infection locally nor systemically which is great news. No fevers, chills, nausea, vomiting, or diarrhea. Electronic Signature(s) Signed: 10/26/2022 1:26:20 PM By: Allen Derry PA-C Entered By: Allen Derry on 10/26/2022 13:26:20 -------------------------------------------------------------------------------- Physician Orders Details Patient Name: Date of Service:  [name], [name] 10/25/2022 10:45 A M Medical Record Number: [id number] Patient Account Number: [id number] Date of Birth/Sex: Treating RN: 09-12-1955 (67 y.o. 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 Wash wounds with antibacterial soap and water. May shower; gently cleanse wound with antibacterial soap, rinse and pat dry prior to dressing wounds No tub bath. Edema Control - Lymphedema / Segmental Compressive Device / Other Elevate, Exercise Daily and A void Standing for Long Periods of Time. Elevate leg(s) parallel to the floor when sitting. DO YOUR BEST to sleep in the bed at night. DO NOT sleep in your recliner. Long hours of sitting in a recliner leads to swelling of the legs and/or potential wounds on your backside. Other: - use lymphedema pumps Non-Wound Condition Bilateral Lower Extremities Cleanse affected area with antibacterial soap and water, Medications-Please add to medication list. ntibiotic - apply Mupirocin to  weepy areas as needed Topical A Electronic Signature(s) Signed: 10/26/2022 1:44:50 PM By: Allen Derry PA-C Signed: 10/30/2022 5:03:01 PM By: Elliot Gurney, BSN, RN, CWS, Kim RN, BSN Bale, Espy C ([id number]) PM By: Elliot Gurney, BSN, RN, CWS, Kim RN, BSN [phone].pdf Page 7 of 13 Signed: 10/30/2022 5:03:01 Entered By: Elliot Gurney, BSN, RN, CWS, Kim on 10/25/2022 12:06:42 -------------------------------------------------------------------------------- Problem List Details Patient Name: Date of Service: [name], [name] 10/25/2022 10:45 A M Medical Record Number: [id number] Patient Account Number: [id number] Date of Birth/Sex: Treating RN: Jul 07, 1956 (67 y.o. 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 [name] Primary Care Provider: [name] Crutch Other Clinician: Referring Provider: Treating Provider/Extender: Wynona Dove in Treatment: 24 Subjective Chief Complaint Information obtained from Patient Bilateral LE lymphedema with Left  LE Ulcers History of Present Illness (HPI) The patient is a 67 year old female with history of hypertension and a long-standing history of bilateral lower extremity lymphedema (first presented on 4/2) . She has had open ulcers in the past which have always responded to compression therapy. She had briefly been to a lymphedema clinic in the past which helped her at the time. this time around she stopped treatment of her lymphedema pumps approximately 2 weeks ago because of some pain in the knees and then noticed the right leg getting worse. She was seen by her PCP who put her on clindamycin 4 times a day 2 days  ago. The patient has seen [facility] and Dr. Gilda Crease had seen her last year where a vascular study including venous and arterial duplex studies were within normal limits. he had recommended compression stockings and lymphedema pumps and the patient has been using this in about 2 weeks ago. She is known to be diabetic but in the past few time she's gone to her primary care doctor her hemoglobin A1c has been normal. 02/11/2015 - after her last visit she took my advice and went to the ER regarding the progressive cellulitis of her right lower extremity and she was admitted between July 17 and 22nd. She received IV antibiotics and then was sent home on a course of steroid-induced and oral antibiotics. She has improved much since then. 02/17/2015 -- she has been doing fine and the weeping of her legs has remarkably gone down. She has no fresh issues. [name], [name] ([id number]) [id number] Page 8 of 13 READMISSION 01/15/18 This patient was given this clinic before most recently in 2016 seen by Dr. Meyer Russel. She has massive bilateral lymphedema and over the last 2 months this had weeping edema out of the left leg. She has compression pumps but her compliance with these has been minimal. She has [facility] they've been using TCA/ABDs/kerlix under an Ace wrap.she has had  recent problems with cellulitis. She was apparently seen in the ER and 12/23/17 and given clindamycin. She was then followed by her primary doctor and given doxycycline and Keflex. The pain seems to have settled down. In April 2018 the patient had arterial studies done at [facility]. This showed triphasic waveforms throughout the right leg and mostly triphasic waveforms on the left except for monophasic at the posterior tibial artery distally. She was not felt to have evidence of right lower extremity arterial stenosis or significant problems on the left side. 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 She also had a right lower extremity venous Doppler in January 2018 this was limited by the patient's body habitus and lymphedema. Most of the proximal veins were not visualized The patient presents with an area of denuded skin on the anterior medial part of the left calf. There is weeping edema fluid here. 01/22/18; the patient has somewhat better edema control using her compression pumps twice a day and as a result she has much better epithelialization on the left anterior calf area. Only a small open area remains. 01/29/18; the patient has been compliant with her compression pumps. Both the areas on her calf that healed. The remaining area on the left anterior leg is fully epithelialized Readmission: 02/20/2019 upon evaluation today patient presents for reevaluation due to issues that she is having with the bilateral lower extremities. She actually has wounds open on both legs. On the right she has an area in the crease of her leg on the right around the knee region which is actually draining quite a bit and actually has some fungal type appearance to it. She has been on nystatin powder that seems to have helped to some degree. In regard to the left lower extremity this is actually in the lower portion of her leg closer to the ankle and again is continuing to  drain as well unfortunately. There does not appear to be any signs of active infection at this time which is good news. No fevers, chills, nausea, vomiting, or diarrhea. She tells me that since she was seen last year she is actually been doing quite well  for the most part with regard to her lower extremities. Unfortunately she now is experiencing a little bit more drainage at this time. She is concerned about getting this under control so that it does not get significantly worse. 02/27/2019 on evaluation today patient appears to be doing somewhat better in regard to her bilateral lower extremity wounds. She has been tolerating the dressing changes without complication. Fortunately there is no signs of active infection at this point. No fevers, chills, nausea, vomiting, or diarrhea. She did get her dressing supplies which is excellent news she was extremely excited to get these. She also got paperwork from [employer] for their financial assistance program where they may be able to help her out in the future if needed with supplies at discounted prices. 03/06/2019 on evaluation today patient appears to be doing a little worse with regard to both areas of weeping on her bilateral lower extremities. This is around the right medial knee and just above the left ankle. With that being said she is unfortunately not doing as well as I would like to see. I feel like she may need to potentially go see someone at the lymphedema clinic as the wraps that she needs or even beyond what we can do here at the wound care center. She really does not have wounds she just has open areas of weeping that are causing some difficulty for her. Subsequently because of this and the moisture I am concerned about the potential for infection I am going to likely give her a prophylactic antibiotic today, Keflex, just to be on the safe side. Nonetheless again there is no obvious signs of active infection at this time. 03/13/2019 on evaluation  today patient appears to be doing well with regard to her bilateral lower extremities where she has been weeping compared to even last week's evaluation. I see some areas of new skin growth which is excellent and overall I am very pleased with how things seem to be progressing. No fevers, chills, nausea, vomiting, or diarrhea. 03/20/2019 on evaluation today patient unfortunately is continuing to have issues with significant edema of the left lower extremity. Her right side seems to be doing much better. Unfortunately her left side is showing increased weeping of the lower portion of her leg. This is quite unfortunate obviously we were hoping to get her into the lymphedema clinic they really do not seem to when I see her how if she is draining. Despite the fact this is really not wound related but more lymphedema weeping related. Nonetheless I do not know that this can be helpful for her to even go for that appointment since again I am not sure there is much that they would actually do at this point. We may need to try a 4 layer compression wrap as best we can on her leg. She is on the Augmentin currently although I am still concerned about whether or not there could be potentially something going on infection wise I would obtain a culture though I understand is not the best being that is a surface culture I just 1 to make sure I do not seem to be missing anything. 03/27/2019 on evaluation today patient appears to be doing much better in regard to the left lower extremity compared to last week. Last week she had tremendous weeping which I think was subsequent to infection now she seems to be doing much better and very pleased. This is not completely healed but there is a lot of new  skin growth and it has dried out quite a bit. Overall I think that we are doing well with how things are moving along at this time. No fevers, chills, nausea, vomiting, or diarrhea. 04/03/2019 evaluation today patient appears to  be doing a little worse this week compared to last time I saw her. I think this may be due to the fact that she is having issues with not being able to sleep in her bed at least not until last night. She is therefore been in a lift chair and subsequently has also had issues with not been able to use her pumps since she could not get in bed. With that being said the patient overall seems to be doing okay I do think I may want extend the antibiotic for a little bit longer at least until we can see if her edema and her weeping gets better and if it is then obviously I can always discontinue the antibiotics as of next week however I want her to continue to have it over the next week. 04/10/2019 on evaluation today patient unfortunately is still doing poorly with regard to her left lower extremity. Her right is all things considering doing fairly well. On the left however she continues to have spreading of the area of infection and weeping which appears to be even a larger surface area than noted last week. She did have a positive culture for Pseudomonas in particular which seems to have been of concern she still has green/yellow discharge consistent with Pseudomonas and subsequently a tremendous amount of it. This has me obviously still concerned about the infection not really clearing up despite the fact that on culture it appears the Cipro should have been a good option for treating this. I think she may at this point need IV antibiotics since things are not doing better I do not want to get worse and cause sepsis. She is in agreement with the plan and believes as well that she likely does need to go to the hospital for IV vancomycin. Or something of the like depending on what the recommendation is from the ER. 04/17/2019 on evaluation today patient appears to be doing excellent in regard to her lower extremity on the left. She was in the hospital for several days from when I sent her last we saw her until  just this past Tuesday. Fortunately her drainage is significantly improved and in fact is mostly clear. There is just a couple small areas that may still drain a little bit she states that the [facility] they prescribed for her at discharge she went picked up from pharmacy and got home but has not been able to find it since. She is looked everywhere. She is wondering if I will replace that for her today I will be more than happy to do that. 05/01/2019 on evaluation today patient actually appears to be doing quite well with regard to her lower extremities. She occasionally is having areas that will leak and then heal up mainly when a piece of the fibrotic skin pops off but fortunately she is not having any signs of active infection at this time. Overall she also really does not have any obvious weeping at this time. I do believe however she really needs some compression wraps and I think this may be a good time to get her back to the lymphedema clinic. 05/11/2019 on evaluation today patient actually appears to be doing quite well with regard to her bilateral lower extremities. She  occasionally will have a small area that we per another but in general seems to be completely healed which is great news. Overall very pleased with how everything seems to be progressing. She does have her appointment with lymphedema clinic on November 18. 05/25/2019 on evaluation today patient appears to be doing well with regard to her left lower extremity. I am very pleased in this regard. In regard to her right leg this actually did start draining more I think it is mainly due to the fact that her leg is more swollen. I am not seeing any obvious signs of infection at this time although that is definitely something were obviously acutely aware of simply due to the fact that she had an issue not too far back with exactly this issue. Nonetheless I do feel like that lymphedema clinic would still be beneficial for her. I explained  obviously if they are not able to do anything treatment wise on the right leg we could at least have them treat her left leg and then proceed from there. The patient is really in agreement with that plan. If they are [name], [name] ([id number]) [id number] Page 9 of 13 able to do both as the drainage slows down that I would be happy to let them handle both. 06/01/2019 on evaluation today patient unfortunately appears to be doing worse with regard to her right lower extremity. The left lower extremity is still maintaining at this point. Unfortunately she has been having significantly increased pain over the past several days and has been experiencing as well increased swelling of the right lower extremity. I really do not know that I am seeing anything that appears to be obvious for infection at this point to be peripherally honest. With that being said the patient does seem to be having much more swelling that she is even experienced in the past and coupled with increased pain in her hip as well I am concerned that again she could potentially have a DVT although I am not 100% sure of this. I think it something that may need to be checked out. We discussed the possibility of sending her for a DVT study through the hospital but unfortunately transportation is an issue if she does have a DVT I do not want her to wait days to be able to get in for that test however if she has this scheduled as an outpatient that is as fast that she will be able to get the test scheduled for transportation purposes. That will also fall on Thanksgiving so subsequently she did actually be looking at either Friday or even next week before we would know anything back from this. That is much too long in my opinion. Subsequent to the amount of discomfort she is experiencing the patient is actually okay with going to the ER for evaluation today. 06/12/2019 on evaluation today patient actually appears to  be doing significantly better compared to last time I saw her. Following when I last saw her she was actually in the hospital from that Monday until the following Sunday almost 1 full week. She actually was placed on Keflex in the hospital following the time for her to be discharged and Dr. Joylene Draft has recommended 2 times a day dosing of the Keflex for the next year in order to help with more prophylactic/preventative measures with regard to her developing cellulitis. Overall I think this sounds like an excellent plan. The patient unfortunately is good to have trouble being treated at lymphedema  clinic due to the fact that she really cannot get up on the bed that they have there. They also state that they cannot manage her as long as she has anything draining at this point. Obviously that is somewhat unfortunate as she does need help with edema control but nonetheless we will have to do what we can for her outside of it sounds like the lymphedema clinic scenario at this point. 06/19/2019 on evaluation today patient appears to be doing fairly well with regard to her bilateral lower extremities. She is not nearly as swollen and shows no signs of infection at this point. There is no evidence of cellulitis whatsoever. She also has no open wounds or draining at this point which is also good news. No fever chills noted. She seems to be in very good spirits and in fact appears to be doing quite well. READMISSION 11/27/2019 This is a 67 year old woman that we have had in this clinic several times before including 2015, 16 and 19 and then most recently from 03/20/2019 through 06/19/2019 with bilateral lower extremity lymphedema. She has had previous arterial and reflux studies done years ago which were not all that remarkable. 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 She does have a positive family history and she had large legs starting may be in her  63s. She was recently in hospital from 10/20/2019 through 10/28/2019 with right leg cellulitis. She was given Ancef and clindamycin and then Zosyn when a culture showed Pseudomonas. At that time there was purulent drainage. She was followed by infectious disease Dr Joylene Draft. The patient is now back at home. She has noted increased swelling in the right and no drainage in her right leg mostly on the posterior medial aspect in the calf area. She has not had pain or fever. She has literally been improved lysing above dressings because her at the area of this is far too large for standard compression. She has been wrapping the areas with sheets to resorptive pads. She is found these helped somewhat. She does have an appointment with the lymphedema clinic in [place] in late June. Past medical history includes bilateral lymphedema, hypertension, obstructive sleep apnea with CPAP. Recent hospitalization with apparently Pseudomonas cellulitis of the right lower leg 12/15/2019 upon evaluation today patient appears to be doing a little bit worse in regard to her right lower extremity. Unfortunately she is having more weeping down in the lower portion of her leg. Fortunately there is no signs of active infection at this time. No fever chills noted. The patient states she is not having increased pain except for when she attempted to use the lymphedema pumps unfortunately she states that she did have pain when she did this. Otherwise we been using absorptive dressings of one type or another she is using diapers at home and then subsequently Ace wraps. In regard to the barrier cream we have discussed the possibility of derma cloud which she would like to try I do not have a problem with that. 12/22/2019 upon evaluation today patient actually appears to be doing better in regard to her leg ulcers at this point. Fortunately there does not appear to be any signs of active infection which is great news and I am  extremely pleased with where things are progressing at this time. There is no sign of active infection currently. The patient is very pleased to see things doing so well. 12/29/2019 upon evaluation today patient appears to  be doing a little bit better in regard to her weeping in general over her lower extremities. She does have some signs of mild erythema little bit more than what I noted last week or rather last visit. Nonetheless I think that my threshold for switching her antibiotics from Keflex to something else is very low at this point considering that she has had such severe infections in the past that seem to come almost out of nowhere. There is a little erythema and warmth noted of the lower portion of her leg compared to the upper which also makes me want to go ahead and address things more rapidly at this point. Likely I would switch out the Keflex for something like Levaquin ideally. 7/16; patient with severe bilateral lymphedema. She has superficial wounds albeit almost circumferential now on the left lateral lower leg. This may be new from last time. Small area on the right anterior lower leg and then another area on the right medial lower leg and of pannus fold. She has been using various absorptive garments. She states she is using her compression pumps once a day occasionally twice. Culture from her last visit here was negative 01/29/2020 on evaluation today patient appears to be doing excellent at this point in regard to her legs with regard to infection I see no signs of active infection at this point. She still does have unfortunately areas of weeping this is minimal on the right now her left is actually significantly worse although I do not think it is as bad as last week with Dr. Leanord Hawking saw her. She has been trying to pump and elevate her legs is much as possible. She has previously been on the Keflex and in the past for prevention that seems to do fairly well and likely can extend  that today. 02/04/2020 on evaluation today patient appears to be doing better in regard to her legs bilaterally. Fortunately there is no signs of active infection at this time which is great news and overall she has less weeping on the left compared to the right and there is several spots where she is pretty much sealed up with no draining regions. Overall very pleased in this regard. 02/19/2020 on evaluation today patient appears to be doing very well in regard to her wounds currently. Fortunately there is no evidence of active infection overall very pleased with where things stand. She is significantly improved in regard to her edema I am extremely pleased in this regard she tells me that the popping no longer hurts and in fact she actually looks forward to it. 03/04/2020 on evaluation today patient appears to be doing excellent in regard to her lower extremities. Fortunately there is no signs of active infection at this time. No fevers, chills, nausea, vomiting, or diarrhea. 03/25/2020 on evaluation today patient appears to be doing a little bit more poorly in regard to her legs at this point. She tells me that she is still continue to have issues with drainage and this has been a little bit worse she was getting ready to start taking the Keflex again but wanted to see me first. Fortunately there is no signs of active infection at this time. No fevers, chills, nausea, vomiting, or diarrhea. 04/15/2020 upon evaluation today patient appears to be doing somewhat poorly in regard to her right leg. She tells me she has been having more pain she has been taking the Keflex that was previously prescribed unfortunately that just does not seem to help with  this. She was hoping that the pain on her right was actually coming from the fact that she was having issues with her wrap having gotten caught in her recliner. With that being said she tells me that she knew something was not right. Currently her right leg is  warm to touch along with being erythematous all the way up to around at least mid thigh as far as I can see. The left leg does not appear to be doing that badly though there is increased weeping around the ankle region. 05/06/2020 on evaluation today patient appears to be doing much better than last time I saw her. She did go to the hospital where she was admitted for 2 days and treated with antibiotic therapy. She was discharged with antibiotics as well and has done extremely well. I am extremely pleased with where things stand today. There is no signs of active infection at this time which is great news. 05/27/2020 upon evaluation today patient appears to be doing well with regard to her lower extremities bilaterally. She has just a very tiny area on the right leg SHANESHA, BEDNARZ ([id number]) [id number] Page 10 of 13 which is opening on the left leg she is significantly improved though she still has several areas that do appear to be open this is minimal compared to what is been in the past. In general I am extremely pleased with where things stand today. The patient does tell me she is not been using her pumps quite as much as she should be. I do believe that is 1 area she can definitely work on. She has had a lot going on including a Covid exposure and apparently also a outbreak of likely shingles. 06/24/2020 upon evaluation today patient appears to be doing well with regard to her legs in general although the left leg unfortunately is showing some signs of erythema she does have a little bit of increased weeping and to be honest I am concerned about infection here. I discussed that with her today and I think that we may need to address this sooner rather than later she has been taking Keflex she is not really certain that is been making a big improvement however. No fevers, chills, nausea, vomiting, or diarrhea. 06/30/2020 upon evaluation today patient's legs actually  seem to be doing better in my opinion as compared to where they were last week. Fortunately there does not appear to be any signs of active infection. Her culture showed multiple organisms nothing predominate. With that being said the Levaquin seems to have done well I think she has improved since I last saw her as well. 07/14/2020 upon evaluation today patient appears to be doing actually better in regard to her lower extremities in my opinion. She has been tolerating the dressing changes without complication. Fortunately there is no signs of active infection at this time. 08/04/2020 upon evaluation today patient appears to be doing excellent in regard to her leg ulcers. Fortunately she has very little that is open at this point. This is great news. In fact I think that the [employer] medicated powder has been excellent for her. It seems to have done the trick where we had tried several other things without as much success. Fortunately there is no evidence of active infection at this time. No fevers, chills, nausea, vomiting, or diarrhea. 09/01/2020 upon evaluation today patient appears to be doing a little bit more poorly than the last time I saw her. She tells me right now  that she has been having a lot of drainage compared to where things were previous which has unfortunately led to more irritation as well. She is concerned that this is leading to infection. Again based on what I am seeing today as well I am also concerned of the same to be honest. 09/15/2020 upon evaluation today patient appears to be doing well with regard to her legs compared to what I saw previous. Fortunately there does not appear to be any evidence of active infection at this time which is great news. At least not as badly as what it was previous. With that being said I do believe that the patient does need to have an extension of the antibiotics. This I believe will actually help her more in the way of making sure this stays under  control and does not worsen. That was the Bactrim DS. Readmission: 08/01/2021 upon evaluation today patient appears to be doing actually pretty well all things considered. Is actually been almost a year since have seen her last March. Fortunately I do not see any evidence of active infection locally nor systemically at this point. She does have a couple open areas on the left leg the right leg appears to be doing quite well. In general she has been in the hospital 3 times since I saw her a year ago all 4 cellulitis type issues except for the last 1 which was actually more related to congestive heart failure for that reason she is not using her lymphedema pumps at this point and that is probably the best idea. 08/08/2021 upon evaluation today patient appears to be doing well with regard to her legs all things considered her left leg may be a little bit more swollen based on what I see today. Fortunately I do not see any signs of active infection locally nor systemically at this time which is great news. No fevers, chills, nausea, vomiting, or diarrhea. 08/15/2021 upon evaluation today patient actually appears to be doing excellent in regard to her wounds. She has been tolerating the dressing changes without complication. Fortunately I see no evidence of infection currently which is great news. No fevers, chills, nausea, vomiting, or diarrhea. 08/29/2021 upon evaluation patient appears to be doing excellent she in fact is almost completely healed. I am actually very pleased with where we stand today and I think that she is making wonderful progress she just has a small area of weeping on the right lateral leg everything else is pretty much completely healed which is also. 09/05/2021 upon evaluation today patient appears to be doing well with regard to her legs. She does have a small area of weeping on the right leg and there is a small area on the left leg as well. It is potentially something that may need  to be addressed in the future more specifically but right now I think that there may have just been a little drainage here on the left. With all that being said I think that this still does not appear to be infected and overall I feel like she is doing quite well. That something we always have to keep a very close eye on as she can change very rapidly from okay to not okay. 09/12/2021 upon evaluation today patient appears to be doing a little bit worse in regard to her leg and actually somewhat concerned about the possibility of infection here. I discussed that with the patient. For that reason I am going to go ahead and see  about getting her set up for a repeat prescription for the Bactrim. She is in agreement with that plan. 3/14; patient presents for follow-up. She has been using silver alginate with dressing changes. She is still taking Bactrim prescribed at last clinic visit. She has no issues or complaints today. She has lymphedema pumps however does not use these. 09/26/2020 upon evaluation today patient appears to actually be doing well in regard to her right leg I am pleased in that regard. Unfortunately she has new areas on her left leg that are open at this point that I do need to be addressed. I am also concerned about the warmth around one of the wounds on the more anterior side. I think this is something that we may need to culture today potentially requiring antibiotics going forward. 10/03/2021 upon evaluation today patient unfortunately is not doing nearly as well as she was even last week with her wounds. She is having some issues here with increased cellulitis of the left lower extremity unfortunately. I did prescribe Augmentin for her eczema prescribed last week unfortunately she never actually received this prescription. The pharmacy stated they never got it. We double checked with them today they still did not receive it. I am not sure what happened in that regard. With that being  said the patient is in good spirits today she does not appear to be as sick as where she normally is when the cellulitis gets significantly worse as it has in the past. Nonetheless the leg is much more red not just around the ankle where I was seeing at last week on Tuesday but rather now this is going all the way up to almost her knee and is definitely hot to touch compared to the right leg. Nonetheless I feel like she does need to go to the ER for likely IV antibiotics. 10-10-2021 upon evaluation today patient appears to be doing well with regard to her wound. She has been tolerating the dressing changes that appears to be doing much better. After I saw her last week she did go to the ER they admitted her to the hospital and gave her IV antibiotics she fortunately is doing significantly better. Overall I am extremely pleased with where things stand today. 10-17-2021 upon evaluation today patient appears to be doing well with regard to her wound. In fact this is looking better and her leg is looking better but she still does have some areas here of erythema. We will continue to address this as soon as possible in my opinion. I do believe she may require some debridement and what appears to be a more defined wound on her left leg at this point. 10-24-2021 upon evaluation patient is definitely showing signs of improvement which is great news. I do not see any evidence of active infection locally or systemically which is great news as well. No fevers, chills, nausea, vomiting, or diarrhea. 10-31-2021. Upon evaluation today patient's leg actually feels better from an infection and wound care perspective. I am actually very pleased in this regard. Unfortunately the biggest issue that I see at this time is that the patient is having a significant issue with her breathing. I did put her on the pulse ox machine to check her oxygen saturation and it was pretty much at 100% which is good but she tells me that when  she is walking this is much more difficult for her. She also tells me that when she is trying to bend over to perform  her dressing changes she is so short of breath due to the swelling and fluid in her abdominal area that she is just not been able to do this. She is tearful and very concerned today to be honest. I am truly sorry to see her this way I know she is really having a hard time at the moment. 11-14-2021 upon evaluation patient actually appears to be doing significantly better compared to when I last saw her. She was actually admitted to the hospital I [name], [name] ([id number]) [id number] Page 11 of 13 believe this for 6 days total after I sent her on 10-31-2021. Subsequently they actually pulled off greater than 50 pounds of fluid she tells me she feels significantly better her legs are not as tight she actually has some play in the tissue and it is obvious that she is doing much better just from a mobility standpoint as well as a breathing standpoint. Overall I am extremely happy for her and how she is doing the leg also looks much better on the left. 11-21-2021 upon evaluation today patient appears to be doing well although it does appear that she is going require some sharp debridement the wound is very dry this is the opposite of what its been she was having so much weeping that it was staying extremely wet. Nonetheless we do need to see about going ahead and debriding the wound today. 11-28-2021 upon evaluation today patient appears to be doing well with regard to her wound I definitely see signs of things continuing to improve which is great news. I do not see any evidence of active infection locally or systemically also great news. 12-05-2021 upon evaluation today patient appears to be doing well with regard to her wound. She has been tolerating the dressing changes. Fortunately there does not appear to be any signs of active infection locally or systemically  at this time. No fevers, chills, nausea, vomiting, or diarrhea. 12-12-2021 upon evaluation patient appears to be doing well with regard to her wound this is actually measuring smaller and looking much better. Fortunately I do not see any signs of active infection locally or systemically at this time which is excellent news. 6/13; small wound area in the folds of her lymphedema just above the left ankle. Fortunately the area looks quite good. She has been using silver alginate ABDs and an Ace wrap which she is able to change her self. She is not using her compression pumps out of fear that this could contribute to heart failure. 12-26-2021 upon evaluation patient's wounds are actually showing signs of excellent improvement. I am very pleased with where things stand. Overall I think that she has been making excellent progress and I think we are very close to complete resolution which is great news. 01-11-2022 upon evaluation today patient appears to be doing well currently in regard to her legs in general although on the left leg she does have 1 area that still irritated and inflamed on the anterior portion of her leg. Fortunately I do not see any signs of systemic infection locally there might be some infection going on here however which is my biggest concern. Fortunately I do not see any evidence though of this spreading to any other location. 01-16-2022 upon evaluation today patient has actually showing signs of excellent improvement. Fortunately I do not see any evidence of infection locally or systemically which is great news and overall I am very pleased with where we stand at this point. 01-25-2022 upon  evaluation today patient actually appears to be doing decently well in regard to her legs. She has been tolerating the dressing changes without complication. Fortunately there does not appear to be any evidence of active infection locally or systemically which is great news and overall I am  extremely pleased with where we stand at this point. 02-13-2022 upon evaluation today patient appears to be doing well currently in regard to her wound which is actually showing signs of significant improvement. Fortunately I do not see any evidence of active infection locally or systemically at this time. No fevers, chills, nausea, vomiting, or diarrhea. 02-20-2022 upon evaluation today patient appears to be doing well currently in regard to her wound. She has been tolerating the dressing changes without complication. Fortunately there does not appear to be any signs of active infection locally or systemically at this time. No fevers, chills, nausea, vomiting, or diarrhea. 02-27-2022 upon evaluation today patient appears to be doing excellent in regard to her wounds on the leg. She has been tolerating the dressing changes without complication this is very close to complete resolution. Fortunately I do not see any signs of infection locally or systemically at this time which is great news. No fevers, chills, nausea, vomiting, or diarrhea. 03-06-2022 upon evaluation today patient appears to be doing well currently in regard to her wound in general. She has been tolerating the dressing changes without complication. Fortunately I do not see any evidence of active infection locally or systemically at this time which is great news. I am concerned a little bit however about not really her wounds but the second toe right foot where there appears to be some cellulitis after she dropped a frying pan on this about 2 weeks ago. She tells me its been itching and burning and I do think that it is possible she may have an infection based on what we are seeing currently. 03-20-2022 upon evaluation today patient actually appears to be doing much better at this point. Fortunately there does not appear to be any signs of active infection locally or systemically at this time which is great news. No fevers, chills, nausea,  vomiting, or diarrhea. 03-27-2022 upon evaluation today patient appears to be doing somewhat poorly in regard to her legs compared to previous. Her right leg has a couple areas that are open and she is very warm and painful to touch around the lateral portion of the ankle area. Left leg is showing signs of little bit more drainage and weeping as well I think that this is probably due to the fact that she is swelling a lot more that she has been in the past. Fortunately I do not see any signs of active infection systemically though locally there is definitely some issues here. 04-03-2022 upon evaluation today patient appears to be doing poorly in regard to her wounds on the legs unfortunately. She also seems to have more significant cellulitis that has ensued. Unfortunately she has been on doxycycline initially it seemed to be helping but as of this week that is no longer the case. It started getting worse last week and with the reinitiation of the doxycycline nothing has improved. Often when she gets like this IV antibiotics are necessary. 04-19-2022 upon evaluation today patient appears to be doing well currently in regard to her legs from an infection standpoint. She did get IV vancomycin in the hospital and states that her legs did get better when she left she was no longer weeping but she had  been in the bed for 4 days straight. With that being said since coming home her legs have begun to weep again the original wound that we were dealing with has healed she does have some weeping over both lower extremities however which is still of concern. 05-03-2022 upon evaluation today patient appears to be doing well currently in regard to her left leg which I feel like is a little bit better unfortunately the right leg is still draining significantly. Fortunately I do not see any signs of infection locally or systemically at this time. No fevers, chills, nausea, vomiting, or diarrhea. 05-10-2022  unfortunately patient actually showed signs of increasing erythema today. I believe that the cellulitis never completely resolved from when she was in the hospital last. Again she has been on doxycycline previously by myself I gave her Keflex more recently as an ongoing prescription to try to prevent infection but again I do not think this is strong enough to be able to get this under control. I really feel like that she is probably can need IV antibiotics and a good period of time in the hospital to be able to keep her legs elevated and not having to get around to do a whole lot in order to allow this to resolve. The patient voiced understanding. 05-24-2022 upon evaluation today patient appears to be doing excellent in regard to her legs. She has been tolerating the dressing changes without complication. Fortunately there does not appear to be any signs of infection at this time which is great news she has been on cefadroxil since she was discharged from the hospital. The day that we sent her she actually tells me that she ended up in septic shock that day and coded. When she first showed up they did not notice it on the lab work but said by the end of the day she had already undergone this and they had had to address the issue obviously when she coded. She tells me that likely she would not be here today if she had not gone to the hospital that day. I explained to her that I do believe that was a God thing and it was just the perfect timing that he had for her at that time. 06-14-2022 upon evaluation today patient appears to be doing well currently in regard to her legs. In general I think she is making good progress and I do not see any signs of infection at this time which is great news. No fevers, chills, nausea, vomiting, or diarrhea. 06-28-2022 upon evaluation today patient appears to be doing excellent in regard to her legs. She is actually showing signs of excellent improvement I do not see any  evidence of infection locally nor systemically at this time. No fevers, chills, nausea, vomiting, or diarrhea. [name], [name] ([id number]) [id number] Page 12 of 13 07-26-2022 upon eval evaluation today patient appears to be doing well currently in regard to her wound. She has been tolerating the dressing changes without complication. Fortunately I do not see any major issues here she has a couple areas of weeping mainly on the right leg the left leg seems to be doing pretty well. 08-23-2021 upon evaluation today patient appears to be doing excellent in regard to her legs. These are showing signs of wonderful improvement I am actually very pleased with where we stand I think she is doing the best that I have ever seen her. Fortunately I do not see any evidence of infection locally or systemically  which is great news. 09-27-2022 upon evaluation today patient appears to be doing well she has no open wounds at this time her lymphedema seems to be doing quite well at this point. Fortunately I do not see any signs of active infection locally nor systemically which is great news and overall her swelling though up a little bit is not nearly as bad as it has been at times. No evidence of cellulitis currently. 10-25-2022 upon evaluation patient appears to be doing excellent in regard to her wounds currently. I do not see anything open that this is great her leg still has the bilateral lymphedema but again this is not nearly as severe as what it has been in the past I think she is doing really well. She is using the pumps when she can although she does tell me there is been some issue there with her arthritis at times not being able to do what she needs to do. Objective Constitutional Well-nourished and well-hydrated in no acute distress. Vitals Time Taken: 11:44 AM, Height: 63 in, Weight: 372 lbs, BMI: 65.9, Temperature: 97.9 F, Pulse: 93 bpm, Respiratory Rate: 18 breaths/min,  Blood Pressure: 145/82 mmHg. Respiratory normal breathing without difficulty. Psychiatric this patient is able to make decisions and demonstrates good insight into disease process. Alert and Oriented x 3. pleasant and cooperative. General Notes: Upon inspection patient's wound bed actually showed signs of good granulation epithelization at this point. Fortunately I do not see any signs of active infection locally nor systemically which is great news. No fevers, chills, nausea, vomiting, or diarrhea. 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 Wash wounds with antibacterial soap and water. May shower; gently cleanse wound with antibacterial soap, rinse and pat dry prior to dressing wounds No tub bath. Edema Control - Lymphedema / Segmental Compressive Device / Other: Elevate, Exercise Daily and Avoid Standing for Long Periods of Time. Elevate leg(s) parallel to the floor when sitting. DO YOUR BEST to sleep in the bed at night. DO NOT sleep in your recliner. Long hours of sitting in a recliner leads to swelling of the legs and/or potential wounds on your backside. Other: - use lymphedema pumps Non-Wound Condition: Cleanse affected area with antibacterial soap and water, Medications-Please add to medication list.: Topical Antibiotic - apply Mupirocin to weepy areas as needed 1. I am good recommend that we have the patient continue to monitor for any signs of infection or worsening. Based on what I am seeing I do believe that she is making pretty good progress here for the most part. I am hopeful she will continue to see improvements. 2. I am  good recommend as well that the patient should continue with the elevation of her legs she is also looking into getting some wraps that she found that may be beneficial from a lymphedema standpoint we will see how that does for her. [name], [name] ([id number]) [id number] Page 13 of 13 We will see patient back for reevaluation in 1 month here in the clinic. If anything worsens or changes patient will contact our office for additional recommendations. Electronic Signature(s) Signed: 10/26/2022 1:26:53 PM By: Allen Derry PA-C Entered By: Allen Derry on 10/26/2022 13:26:53 -------------------------------------------------------------------------------- SuperBill  Details Patient Name: Date of Service: HIBBA, SCHRAM 10/25/2022 Medical Record Number: [id number] Patient Account Number: [id number] Date of Birth/Sex: Treating RN: [birth year] (67 y.o. Cathlean Cower, Kim Primary Care Provider: [name] Crutch Other Clinician: Referring Provider: Treating Provider/Extender: Wynona Dove in Treatment: 64 Diagnosis Coding ICD-10 Codes Code Description Q82.0 Hereditary lymphedema L97.811 Non-pressure chronic ulcer of other part of right lower leg limited to breakdown of skin L97.821 Non-pressure chronic ulcer of other part of left lower leg limited to breakdown of skin Facility Procedures : CPT4 Code: [id number] Description: [phone] - WOUND CARE VISIT-LEV 2 EST PT Modifier: Quantity: 1 Physician Procedures : CPT4 Code Description Modifier [id number] - WC PHYS LEVEL 3 - EST PT ICD-10 Diagnosis Description Q82.0 Hereditary lymphedema L97.811 Non-pressure chronic ulcer of other part of right lower leg limited to breakdown of skin L97.821 Non-pressure  chronic ulcer of other part of left lower leg limited to breakdown of skin Quantity: 1 Electronic Signature(s) Signed: 10/26/2022 1:27:14 PM By: Allen Derry PA-C Entered By: Allen Derry on 10/26/2022  13:27:14

## 2022-10-25 NOTE — Progress Notes (Addendum)
[name], [name] ([id number]) [id number] Page 1 of 5 Visit Report for 10/25/2022 Arrival Information Details Patient Name: Date of Service: [name], [name] 10/25/2022 10:45 A M Medical Record Number: [id number] Patient Account Number: [id number] Date of Birth/Sex: Treating RN: [birth year] (67 y.o. Skip Mayer Primary Care Lillyen Schow: [name] Crutch Other Clinician: Referring Jailine Lieder: Treating Kameron Glazebrook/Extender: Wynona Dove in Treatment: 40 Visit Information History Since Last Visit Added or deleted any medications: No Patient Arrived: Walker Has Dressing in Place as Prescribed: Yes Arrival Time: 11:43 Pain Present Now: No Accompanied By: self Transfer Assistance: None Patient Identification Verified: Yes Secondary Verification Process Completed: Yes Patient Requires Transmission-Based Precautions: No Patient Has Alerts: Yes Patient Alerts: Borderline Diabetic Electronic Signature(s) Signed: 10/30/2022 5:03:01 PM By: Elliot Gurney, BSN, RN, CWS, Kim RN, BSN Entered By: Elliot Gurney, BSN, RN, CWS, Kim on 10/25/2022 11:44:07 -------------------------------------------------------------------------------- Clinic Level of Care Assessment Details Patient Name: Date of Service: [name] 10/25/2022 10:45 A M Medical Record Number: [id number] Patient Account Number: [id number] Date of Birth/Sex: Treating RN: 1956-06-24 (67 y.o. Cathlean Cower, Kim Primary Care Almyra Birman: [name] Crutch Other Clinician: Referring Allyah Heather: Treating Maelys Kinnick/Extender: Wynona Dove in Treatment: 64 Clinic Level of Care Assessment Items TOOL 4 Quantity Score  - 0 Use when only an EandM is performed on FOLLOW-UP visit ASSESSMENTS - Nursing Assessment / Reassessment X- 1 10 Reassessment of Co-morbidities (includes updates in patient status) X- 1 5 Reassessment of Adherence to Treatment Plan ASSESSMENTS - Wound and Skin A ssessment /  Reassessment  - 0 Simple Wound Assessment / Reassessment - one wound X- 1 5 Complex Wound Assessment / Reassessment - multiple wounds  - 0 Dermatologic / Skin Assessment (not related to wound area) ASSESSMENTS - Focused Assessment  - 0 Circumferential Edema Measurements - multi extremities  - 0 Nutritional Assessment / Counseling / Intervention  - 0 Lower Extremity Assessment (monofilament, tuning fork, pulses)  - 0 Peripheral Arterial Disease Assessment (using hand held doppler) ASSESSMENTS - Ostomy and/or Continence Assessment and Care  - 0 Incontinence Assessment and Management  - 0 Ostomy Care Assessment and Management (repouching, etc.) PROCESS - Coordination of Care X - Simple Patient / Family Education for ongoing care 1 15  - 0 Complex (extensive) Patient / Family Education for ongoing care [name] ([id number]) [id number] Page 2 of 5 X- 1 10 Staff obtains Consents, Records, T Results / Process Orders est  - 0 Staff telephones HHA, Nursing Homes / Clarify orders / etc  - 0 Routine Transfer to another Facility (non-emergent condition)  - 0 Routine Hospital Admission (non-emergent condition)  - 0 New Admissions / Manufacturing engineer / Ordering NPWT Apligraf, etc. ,  - 0 Emergency Hospital Admission (emergent condition)  - 0 Simple Discharge Coordination  - 0 Complex (extensive) Discharge Coordination PROCESS - Special Needs  - 0 Pediatric / Minor Patient Management  - 0 Isolation Patient Management  - 0 Hearing / Language / Visual special needs  - 0 Assessment of Community assistance (transportation, D/C planning, etc.)  - 0 Additional assistance / Altered mentation  - 0 Support Surface(s) Assessment (bed, cushion, seat, etc.) INTERVENTIONS - Wound Cleansing / Measurement  - 0 Simple Wound Cleansing - one wound  - 0 Complex Wound Cleansing - multiple wounds  -  0 Wound Imaging (photographs - any number of wounds)  - 0 Wound Tracing (instead of photographs)  - 0 Simple Wound Measurement - one wound  - 0 Complex Wound Measurement -  multiple wounds INTERVENTIONS - Wound Dressings  - 0 Small Wound Dressing one or multiple wounds  - 0 Medium Wound Dressing one or multiple wounds  - 0 Large Wound Dressing one or multiple wounds  - 0 Application of Medications - topical  - 0 Application of Medications - injection INTERVENTIONS - Miscellaneous  - 0 External ear exam  - 0 Specimen Collection (cultures, biopsies, blood, body fluids, etc.)  - 0 Specimen(s) / Culture(s) sent or taken to Lab for analysis  - 0 Patient Transfer (multiple staff / Nurse, adult / Similar devices)  - 0 Simple Staple / Suture removal (25 or less)  - 0 Complex Staple / Suture removal (26 or more)  - 0 Hypo / Hyperglycemic Management (close monitor of Blood Glucose)  - 0 Ankle / Brachial Index (ABI) - do not check if billed separately X- 1 5 Vital Signs Has the patient been seen at the hospital within the last three years: Yes Total Score: 50 Level Of Care: New/Established - Level 2 Electronic Signature(s) Signed: 10/30/2022 5:03:01 PM By: Elliot Gurney, BSN, RN, CWS, Kim RN, BSN Entered By: Elliot Gurney, BSN, RN, CWS, Kim on 10/25/2022 12:08:06 [name] ([id number]) [id number] Page 3 of 5 -------------------------------------------------------------------------------- Encounter Discharge Information Details Patient Name: Date of Service: [name], [name] 10/25/2022 10:45 A M Medical Record Number: [id number] Patient Account Number: [id number] Date of Birth/Sex: Treating RN: [birth year] (67 y.o. Skip Mayer Primary Care Alvaro Aungst: [name] Crutch Other Clinician: Referring Yenesis Even: Treating Anjela Cassara/Extender: Wynona Dove in Treatment: 64 Encounter Discharge Information Items Discharge  Condition: Stable Ambulatory Status: Walker Discharge Destination: Home Transportation: Private Auto Schedule Follow-up Appointment: Yes Clinical Summary of Care: Electronic Signature(s) Signed: 10/30/2022 5:03:01 PM By: Elliot Gurney, BSN, RN, CWS, Kim RN, BSN Entered By: Elliot Gurney, BSN, RN, CWS, Kim on 10/25/2022 12:09:33 -------------------------------------------------------------------------------- Lower Extremity Assessment Details Patient Name: Date of Service: [name], [name] 10/25/2022 10:45 A M Medical Record Number: [id number] Patient Account Number: [id number] Date of Birth/Sex: Treating RN: 1955/12/04 (67 y.o. Skip Mayer Primary Care Jaykwon Morones: [name] Crutch Other Clinician: Referring Amire Gossen: Treating Jhanvi Drakeford/Extender: Wynona Dove in Treatment: 64 Edema Assessment Assessed: [Left: No] [Right: No] [Left: Edema] [Right: :] Calf Left: Right: Point of Measurement: 34 cm From Medial Instep 81 cm 81 cm Ankle Left: Right: Point of Measurement: 10 cm From Medial Instep 51 cm 52 cm Vascular Assessment Pulses: Dorsalis Pedis Palpable: [Left:Yes] [Right:Yes] Electronic Signature(s) Signed: 10/30/2022 5:03:01 PM By: Elliot Gurney, BSN, RN, CWS, Kim RN, BSN Entered By: Elliot Gurney, BSN, RN, CWS, Kim on 10/25/2022 11:47:54 -------------------------------------------------------------------------------- Multi Wound Chart Details Patient Name: Date of Service: [name] 10/25/2022 10:45 A M Medical Record Number: [id number] Patient Account Number: [id number] Date of Birth/Sex: Treating RN: [birth year] (67 y.o. Skip Mayer Primary Care Toia Micale: [name] Crutch Other Clinician: Referring Edin Kon: Treating Kimberlyn Quiocho/Extender: Wynona Dove in Treatment: 17 Vital Signs Height(in): 63 Pulse(bpm): [address] ([id number]) [id number] Page 4 of 5 Weight(lbs): 372 Blood Pressure(mmHg): 145/82 Body Mass Index(BMI):  65.9 Temperature(F): 97.9 Respiratory Rate(breaths/min): 18 [Treatment Notes:Wound Assessments Treatment Notes] Electronic Signature(s) Signed: 10/30/2022 5:03:01 PM By: Elliot Gurney, BSN, RN, CWS, Kim RN, BSN Entered By: Elliot Gurney, BSN, RN, CWS, Kim on 10/25/2022 12:05:57 -------------------------------------------------------------------------------- Pain Assessment Details Patient Name: Date of Service: [name], [name] 10/25/2022 10:45 A M Medical Record Number: [id number] Patient Account Number: [id number] Date of Birth/Sex: Treating RN: [birth year] (67 y.o. Skip Mayer Primary Care Chisom Aust: [name] Crutch Other  Clinician: Referring Jeziel Hoffmann: Treating Tanna Loeffler/Extender: Wynona Dove in Treatment: 64 Active Problems Location of Pain Severity and Description of Pain Patient Has Paino No Site Locations Pain Management and Medication Current Pain Management: Electronic Signature(s) Signed: 10/30/2022 5:03:01 PM By: Elliot Gurney, BSN, RN, CWS, Kim RN, BSN Entered By: Elliot Gurney, BSN, RN, CWS, Kim on 10/25/2022 11:44:41 -------------------------------------------------------------------------------- Patient/Caregiver Education Details Patient Name: Date of Service: [name] 4/18/2024andnbsp10:45 A M Medical Record Number: [id number] Patient Account Number: [id number] Date of Birth/Gender: Treating RN: May 21, 1956 (67 y.o. Skip Mayer Primary Care Physician: [name] Crutch Other Clinician: Referring Physician: Treating Physician/Extender: Wynona Dove in Treatment: 51 Education Assessment Education Provided To: Patient [name], [name] ([id number]) [id number] Page 5 of 5 Education Topics Provided Venous: Handouts: Controlling Swelling with Compression Stockings Methods: Explain/Verbal Responses: State content correctly Electronic Signature(s) Signed: 10/30/2022 5:03:01 PM By: Elliot Gurney, BSN, RN, CWS, Kim RN, BSN Entered  By: Elliot Gurney, BSN, RN, CWS, Kim on 10/25/2022 12:08:58 -------------------------------------------------------------------------------- Vitals Details Patient Name: Date of Service: [name], [name] 10/25/2022 10:45 A M Medical Record Number: [id number] Patient Account Number: [id number] Date of Birth/Sex: Treating RN: 1956-01-16 (67 y.o. Cathlean Cower, Kim Primary Care Kalisa Girtman: [name] Crutch Other Clinician: Referring Kenedy Haisley: Treating Damaris Geers/Extender: Wynona Dove in Treatment: 64 Vital Signs Time Taken: 11:44 Temperature (F): 97.9 Height (in): 63 Pulse (bpm): 93 Weight (lbs): 372 Respiratory Rate (breaths/min): 18 Body Mass Index (BMI): 65.9 Blood Pressure (mmHg): 145/82 Reference Range: 80 - 120 mg / dl Electronic Signature(s) Signed: 10/30/2022 5:03:01 PM By: Elliot Gurney, BSN, RN, CWS, Kim RN, BSN Entered By: Elliot Gurney, BSN, RN, CWS, Kim on 10/25/2022 11:44:33

## 2022-11-19 ENCOUNTER — Other Ambulatory Visit: Payer: Self-pay | Admitting: Family

## 2022-11-21 ENCOUNTER — Other Ambulatory Visit: Payer: Self-pay

## 2022-11-21 MED ORDER — SPIRONOLACTONE 25 MG PO TABS: ORAL_TABLET | ORAL | 6 refills | Status: DC

## 2022-11-27 ENCOUNTER — Ambulatory Visit: Payer: Medicare Other | Attending: Family | Admitting: Family

## 2022-11-27 ENCOUNTER — Other Ambulatory Visit (HOSPITAL_COMMUNITY): Payer: Self-pay

## 2022-11-27 ENCOUNTER — Encounter: Payer: Self-pay | Admitting: Family

## 2022-11-27 ENCOUNTER — Encounter: Payer: Medicare Other | Attending: Physician Assistant | Admitting: Physician Assistant

## 2022-11-27 VITALS — BP 110/86 | HR 96 | Ht 63.5 in | Wt 361.0 lb

## 2022-11-27 DIAGNOSIS — Z79899 Other long term (current) drug therapy: Secondary | ICD-10-CM | POA: Diagnosis not present

## 2022-11-27 DIAGNOSIS — I130 Hypertensive heart and chronic kidney disease with heart failure and stage 1 through stage 4 chronic kidney disease, or unspecified chronic kidney disease: Secondary | ICD-10-CM | POA: Insufficient documentation

## 2022-11-27 DIAGNOSIS — M25551 Pain in right hip: Secondary | ICD-10-CM | POA: Insufficient documentation

## 2022-11-27 DIAGNOSIS — E11622 Type 2 diabetes mellitus with other skin ulcer: Secondary | ICD-10-CM | POA: Diagnosis not present

## 2022-11-27 DIAGNOSIS — M25552 Pain in left hip: Secondary | ICD-10-CM | POA: Diagnosis not present

## 2022-11-27 DIAGNOSIS — I890 Lymphedema, not elsewhere classified: Secondary | ICD-10-CM | POA: Diagnosis not present

## 2022-11-27 DIAGNOSIS — N183 Chronic kidney disease, stage 3 unspecified: Secondary | ICD-10-CM | POA: Insufficient documentation

## 2022-11-27 DIAGNOSIS — R5383 Other fatigue: Secondary | ICD-10-CM | POA: Diagnosis not present

## 2022-11-27 DIAGNOSIS — Q820 Hereditary lymphedema: Secondary | ICD-10-CM | POA: Insufficient documentation

## 2022-11-27 DIAGNOSIS — Z7984 Long term (current) use of oral hypoglycemic drugs: Secondary | ICD-10-CM | POA: Diagnosis not present

## 2022-11-27 DIAGNOSIS — R0602 Shortness of breath: Secondary | ICD-10-CM | POA: Diagnosis not present

## 2022-11-27 DIAGNOSIS — I110 Hypertensive heart disease with heart failure: Secondary | ICD-10-CM | POA: Insufficient documentation

## 2022-11-27 DIAGNOSIS — I509 Heart failure, unspecified: Secondary | ICD-10-CM | POA: Diagnosis not present

## 2022-11-27 DIAGNOSIS — I10 Essential (primary) hypertension: Secondary | ICD-10-CM | POA: Diagnosis not present

## 2022-11-27 DIAGNOSIS — R002 Palpitations: Secondary | ICD-10-CM | POA: Insufficient documentation

## 2022-11-27 DIAGNOSIS — G4730 Sleep apnea, unspecified: Secondary | ICD-10-CM | POA: Insufficient documentation

## 2022-11-27 DIAGNOSIS — Z720 Tobacco use: Secondary | ICD-10-CM

## 2022-11-27 DIAGNOSIS — J45909 Unspecified asthma, uncomplicated: Secondary | ICD-10-CM | POA: Insufficient documentation

## 2022-11-27 DIAGNOSIS — E669 Obesity, unspecified: Secondary | ICD-10-CM | POA: Insufficient documentation

## 2022-11-27 DIAGNOSIS — I5032 Chronic diastolic (congestive) heart failure: Secondary | ICD-10-CM | POA: Insufficient documentation

## 2022-11-27 DIAGNOSIS — G4733 Obstructive sleep apnea (adult) (pediatric): Secondary | ICD-10-CM

## 2022-11-27 DIAGNOSIS — L97811 Non-pressure chronic ulcer of other part of right lower leg limited to breakdown of skin: Secondary | ICD-10-CM | POA: Diagnosis not present

## 2022-11-27 DIAGNOSIS — Z87891 Personal history of nicotine dependence: Secondary | ICD-10-CM | POA: Diagnosis not present

## 2022-11-27 DIAGNOSIS — L97821 Non-pressure chronic ulcer of other part of left lower leg limited to breakdown of skin: Secondary | ICD-10-CM | POA: Insufficient documentation

## 2022-11-27 NOTE — Progress Notes (Addendum)
TYSHAY, CAPANNA ([id number]) [id number] Page 1 of 14 Visit Report for 11/27/2022 Chief Complaint Document Details Patient Name: Date of Service: [name], [name] 11/27/2022 10:45 A M Medical Record Number: [id number] Patient Account Number: [id number] Date of Birth/Sex: Treating RN: 1956-01-18 (67 y.o. [name] Primary Care Provider: [name] Crutch Other Clinician: Betha Loa Referring Provider: Treating Provider/Extender: Wynona Dove in Treatment: 101 Information Obtained from: Patient Chief Complaint Bilateral LE lymphedema with Left LE Ulcers Electronic Signature(s) Signed: 11/27/2022 10:57:15 AM By: Allen Derry PA-C Entered By: Allen Derry on 11/27/2022 10:57:14 -------------------------------------------------------------------------------- HPI Details Patient Name: Date of Service: [name], [name] 11/27/2022 10:45 A M Medical Record Number: [id number] Patient Account Number: [id number] Date of Birth/Sex: Treating RN: 31-Aug-1955 (67 y.o. [name] Primary Care Provider: [name] Crutch Other Clinician: Betha Loa Referring Provider: Treating Provider/Extender: Wynona Dove in Treatment: 48 History of Present Illness HPI Description: The patient is a 67 year old female with history of hypertension and a long-standing history of bilateral lower extremity lymphedema (first presented on 4/2) . She has had open ulcers in the past which have always responded to compression therapy. She had briefly been to a lymphedema clinic in the past which helped her at the time. this time around she stopped treatment of her lymphedema pumps approximately 2 weeks ago because of some pain in the knees and then noticed the right leg getting worse. She was seen by her PCP who put her on clindamycin 4 times a day 2 days ago. The patient has seen [facility] and Dr. Gilda Crease had seen her last year where a vascular study  including venous and arterial duplex studies were within normal limits. he had recommended compression stockings and lymphedema pumps and the patient has been using this in about 2 weeks ago. She is known to be diabetic but in the past few time she's gone to her primary care doctor her hemoglobin A1c has been normal. 02/11/2015 - after her last visit she took my advice and went to the ER regarding the progressive cellulitis of her right lower extremity and she was admitted between July 17 and 22nd. She received IV antibiotics and then was sent home on a course of steroid-induced and oral antibiotics. She has improved much since then. 02/17/2015 -- she has been doing fine and the weeping of her legs has remarkably gone down. She has no fresh issues. [name], [name] ([id number]) [id number] Page 2 of 14 01/15/18 This patient was given this clinic before most recently in 2016 seen by Dr. Meyer Russel. She has massive bilateral lymphedema and over the last 2 months this had weeping edema out of the left leg. She has compression pumps but her compliance with these has been minimal. She has [facility] they've been using TCA/ABDs/kerlix under an Ace wrap.she has had recent problems with cellulitis. She was apparently seen in the ER and 12/23/17 and given clindamycin. She was then followed by her primary doctor and given doxycycline and Keflex. The pain seems to have settled down. In April 2018 the patient had arterial studies done at [facility]. This showed triphasic waveforms throughout the right leg and mostly triphasic waveforms on the left except for monophasic at the posterior tibial artery distally. She was not felt to have evidence of right lower extremity arterial stenosis or significant problems on the left side. 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 She also had a right lower extremity  venous Doppler in January  2018 this was limited by the patient's body habitus and lymphedema. Most of the proximal veins were not visualized The patient presents with an area of denuded skin on the anterior medial part of the left calf. There is weeping edema fluid here. 01/22/18; the patient has somewhat better edema control using her compression pumps twice a day and as a result she has much better epithelialization on the left anterior calf area. Only a small open area remains. 01/29/18; the patient has been compliant with her compression pumps. Both the areas on her calf that healed. The remaining area on the left anterior leg is fully epithelialized Readmission: 02/20/2019 upon evaluation today patient presents for reevaluation due to issues that she is having with the bilateral lower extremities. She actually has wounds open on both legs. On the right she has an area in the crease of her leg on the right around the knee region which is actually draining quite a bit and actually has some fungal type appearance to it. She has been on nystatin powder that seems to have helped to some degree. In regard to the left lower extremity this is actually in the lower portion of her leg closer to the ankle and again is continuing to drain as well unfortunately. There does not appear to be any signs of active infection at this time which is good news. No fevers, chills, nausea, vomiting, or diarrhea. She tells me that since she was seen last year she is actually been doing quite well for the most part with regard to her lower extremities. Unfortunately she now is experiencing a little bit more drainage at this time. She is concerned about getting this under control so that it does not get significantly worse. 02/27/2019 on evaluation today patient appears to be doing somewhat better in regard to her bilateral lower extremity wounds. She has been tolerating the dressing changes without complication. Fortunately there is no signs of active  infection at this point. No fevers, chills, nausea, vomiting, or diarrhea. She did get her dressing supplies which is excellent news she was extremely excited to get these. She also got paperwork from [employer] for their financial assistance program where they may be able to help her out in the future if needed with supplies at discounted prices. 03/06/2019 on evaluation today patient appears to be doing a little worse with regard to both areas of weeping on her bilateral lower extremities. This is around the right medial knee and just above the left ankle. With that being said she is unfortunately not doing as well as I would like to see. I feel like she may need to potentially go see someone at the lymphedema clinic as the wraps that she needs or even beyond what we can do here at the wound care center. She really does not have wounds she just has open areas of weeping that are causing some difficulty for her. Subsequently because of this and the moisture I am concerned about the potential for infection I am going to likely give her a prophylactic antibiotic today, Keflex, just to be on the safe side. Nonetheless again there is no obvious signs of active infection at this time. 03/13/2019 on evaluation today patient appears to be doing well with regard to her bilateral lower extremities where she has been weeping compared to even last week's evaluation. I see some areas of new skin growth which is excellent and overall I am very pleased with how things  seem to be progressing. No fevers, chills, nausea, vomiting, or diarrhea. 03/20/2019 on evaluation today patient unfortunately is continuing to have issues with significant edema of the left lower extremity. Her right side seems to be doing much better. Unfortunately her left side is showing increased weeping of the lower portion of her leg. This is quite unfortunate obviously we were hoping to get her into the lymphedema clinic they really do not seem to when  I see her how if she is draining. Despite the fact this is really not wound related but more lymphedema weeping related. Nonetheless I do not know that this can be helpful for her to even go for that appointment since again I am not sure there is much that they would actually do at this point. We may need to try a 4 layer compression wrap as best we can on her leg. She is on the Augmentin currently although I am still concerned about whether or not there could be potentially something going on infection wise I would obtain a culture though I understand is not the best being that is a surface culture I just 1 to make sure I do not seem to be missing anything. 03/27/2019 on evaluation today patient appears to be doing much better in regard to the left lower extremity compared to last week. Last week she had tremendous weeping which I think was subsequent to infection now she seems to be doing much better and very pleased. This is not completely healed but there is a lot of new skin growth and it has dried out quite a bit. Overall I think that we are doing well with how things are moving along at this time. No fevers, chills, nausea, vomiting, or diarrhea. 04/03/2019 evaluation today patient appears to be doing a little worse this week compared to last time I saw her. I think this may be due to the fact that she is having issues with not being able to sleep in her bed at least not until last night. She is therefore been in a lift chair and subsequently has also had issues with not been able to use her pumps since she could not get in bed. With that being said the patient overall seems to be doing okay I do think I may want extend the antibiotic for a little bit longer at least until we can see if her edema and her weeping gets better and if it is then obviously I can always discontinue the antibiotics as of next week however I want her to continue to have it over the next week. 04/10/2019 on evaluation today  patient unfortunately is still doing poorly with regard to her left lower extremity. Her right is all things considering doing fairly well. On the left however she continues to have spreading of the area of infection and weeping which appears to be even a larger surface area than noted last week. She did have a positive culture for Pseudomonas in particular which seems to have been of concern she still has green/yellow discharge consistent with Pseudomonas and subsequently a tremendous amount of it. This has me obviously still concerned about the infection not really clearing up despite the fact that on culture it appears the Cipro should have been a good option for treating this. I think she may at this point need IV antibiotics since things are not doing better I do not want to get worse and cause sepsis. She is in agreement with the plan and  believes as well that she likely does need to go to the hospital for IV vancomycin. Or something of the like depending on what the recommendation is from the ER. 04/17/2019 on evaluation today patient appears to be doing excellent in regard to her lower extremity on the left. She was in the hospital for several days from when I sent her last we saw her until just this past Tuesday. Fortunately her drainage is significantly improved and in fact is mostly clear. There is just a couple small areas that may still drain a little bit she states that the [facility] they prescribed for her at discharge she went picked up from pharmacy and got home but has not been able to find it since. She is looked everywhere. She is wondering if I will replace that for her today I will be more than happy to do that. 05/01/2019 on evaluation today patient actually appears to be doing quite well with regard to her lower extremities. She occasionally is having areas that will leak and then heal up mainly when a piece of the fibrotic skin pops off but fortunately she is not having any signs of  active infection at this time. Overall she also really does not have any obvious weeping at this time. I do believe however she really needs some compression wraps and I think this may be a good time to get her back to the lymphedema clinic. 05/11/2019 on evaluation today patient actually appears to be doing quite well with regard to her bilateral lower extremities. She occasionally will have a small area that we per another but in general seems to be completely healed which is great news. Overall very pleased with how everything seems to be progressing. She does have her appointment with lymphedema clinic on November 18. 05/25/2019 on evaluation today patient appears to be doing well with regard to her left lower extremity. I am very pleased in this regard. In regard to her right leg this actually did start draining more I think it is mainly due to the fact that her leg is more swollen. I am not seeing any obvious signs of infection at this time although that is definitely something were obviously acutely aware of simply due to the fact that she had an issue not too far back with exactly this issue. Nonetheless I do feel like that lymphedema clinic would still be beneficial for her. I explained obviously if they are not able to do anything treatment wise on the right leg we could at least have them treat her left leg and then proceed from there. The patient is really in agreement with that plan. If they are able to do both as the drainage slows down that I would be happy to let them handle both. [name], [name] ([id number]) [id number] Page 3 of 14 06/01/2019 on evaluation today patient unfortunately appears to be doing worse with regard to her right lower extremity. The left lower extremity is still maintaining at this point. Unfortunately she has been having significantly increased pain over the past several days and has been experiencing as well increased swelling of the  right lower extremity. I really do not know that I am seeing anything that appears to be obvious for infection at this point to be peripherally honest. With that being said the patient does seem to be having much more swelling that she is even experienced in the past and coupled with increased pain in her hip as well I am concerned that  again she could potentially have a DVT although I am not 100% sure of this. I think it something that may need to be checked out. We discussed the possibility of sending her for a DVT study through the hospital but unfortunately transportation is an issue if she does have a DVT I do not want her to wait days to be able to get in for that test however if she has this scheduled as an outpatient that is as fast that she will be able to get the test scheduled for transportation purposes. That will also fall on Thanksgiving so subsequently she did actually be looking at either Friday or even next week before we would know anything back from this. That is much too long in my opinion. Subsequent to the amount of discomfort she is experiencing the patient is actually okay with going to the ER for evaluation today. 06/12/2019 on evaluation today patient actually appears to be doing significantly better compared to last time I saw her. Following when I last saw her she was actually in the hospital from that Monday until the following Sunday almost 1 full week. She actually was placed on Keflex in the hospital following the time for her to be discharged and Dr. Steva Ready has recommended 2 times a day dosing of the Keflex for the next year in order to help with more prophylactic/preventative measures with regard to her developing cellulitis. Overall I think this sounds like an excellent plan. The patient unfortunately is good to have trouble being treated at lymphedema clinic due to the fact that she really cannot get up on the bed that they have there. They also state that  they cannot manage her as long as she has anything draining at this point. Obviously that is somewhat unfortunate as she does need help with edema control but nonetheless we will have to do what we can for her outside of it sounds like the lymphedema clinic scenario at this point. 06/19/2019 on evaluation today patient appears to be doing fairly well with regard to her bilateral lower extremities. She is not nearly as swollen and shows no signs of infection at this point. There is no evidence of cellulitis whatsoever. She also has no open wounds or draining at this point which is also good news. No fever chills noted. She seems to be in very good spirits and in fact appears to be doing quite well. READMISSION 11/27/2019 This is a 67 year old woman that we have had in this clinic several times before including 2015, 16 and 19 and then most recently from 03/20/2019 through 06/19/2019 with bilateral lower extremity lymphedema. She has had previous arterial and reflux studies done years ago which were not all that remarkable. 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 She does have a positive family history and she had large legs starting may be in her 45s. She was recently in hospital from 10/20/2019 through 10/28/2019 with right leg cellulitis. She was given Ancef and clindamycin and then Zosyn when a culture showed Pseudomonas. At that time there was purulent drainage. She was followed by infectious disease Dr Steva Ready. The patient is now back at home. She has noted increased swelling in the right and no drainage in her right leg mostly on the posterior medial aspect in the calf area. She has not had pain or fever. She has literally been improved lysing above dressings because her at the area of this is far too large  for standard compression. She has been wrapping the areas with sheets to resorptive pads. She is found these helped somewhat. She does  have an appointment with the lymphedema clinic in [place] in late June. Past medical history includes bilateral lymphedema, hypertension, obstructive sleep apnea with CPAP. Recent hospitalization with apparently Pseudomonas cellulitis of the right lower leg 12/15/2019 upon evaluation today patient appears to be doing a little bit worse in regard to her right lower extremity. Unfortunately she is having more weeping down in the lower portion of her leg. Fortunately there is no signs of active infection at this time. No fever chills noted. The patient states she is not having increased pain except for when she attempted to use the lymphedema pumps unfortunately she states that she did have pain when she did this. Otherwise we been using absorptive dressings of one type or another she is using diapers at home and then subsequently Ace wraps. In regard to the barrier cream we have discussed the possibility of derma cloud which she would like to try I do not have a problem with that. 12/22/2019 upon evaluation today patient actually appears to be doing better in regard to her leg ulcers at this point. Fortunately there does not appear to be any signs of active infection which is great news and I am extremely pleased with where things are progressing at this time. There is no sign of active infection currently. The patient is very pleased to see things doing so well. 12/29/2019 upon evaluation today patient appears to be doing a little bit better in regard to her weeping in general over her lower extremities. She does have some signs of mild erythema little bit more than what I noted last week or rather last visit. Nonetheless I think that my threshold for switching her antibiotics from Keflex to something else is very low at this point considering that she has had such severe infections in the past that seem to come almost out of nowhere. There is a little erythema and warmth noted of the lower portion of  her leg compared to the upper which also makes me want to go ahead and address things more rapidly at this point. Likely I would switch out the Keflex for something like Levaquin ideally. 7/16; patient with severe bilateral lymphedema. She has superficial wounds albeit almost circumferential now on the left lateral lower leg. This may be new from last time. Small area on the right anterior lower leg and then another area on the right medial lower leg and of pannus fold. She has been using various absorptive garments. She states she is using her compression pumps once a day occasionally twice. Culture from her last visit here was negative 01/29/2020 on evaluation today patient appears to be doing excellent at this point in regard to her legs with regard to infection I see no signs of active infection at this point. She still does have unfortunately areas of weeping this is minimal on the right now her left is actually significantly worse although I do not think it is as bad as last week with Dr. Dellia Nims saw her. She has been trying to pump and elevate her legs is much as possible. She has previously been on the Keflex and in the past for prevention that seems to do fairly well and likely can extend that today. 02/04/2020 on evaluation today patient appears to be doing better in regard to her legs bilaterally. Fortunately there is no signs of active infection  at this time which is great news and overall she has less weeping on the left compared to the right and there is several spots where she is pretty much sealed up with no draining regions. Overall very pleased in this regard. 02/19/2020 on evaluation today patient appears to be doing very well in regard to her wounds currently. Fortunately there is no evidence of active infection overall very pleased with where things stand. She is significantly improved in regard to her edema I am extremely pleased in this regard she tells me that the popping no longer  hurts and in fact she actually looks forward to it. 03/04/2020 on evaluation today patient appears to be doing excellent in regard to her lower extremities. Fortunately there is no signs of active infection at this time. No fevers, chills, nausea, vomiting, or diarrhea. 03/25/2020 on evaluation today patient appears to be doing a little bit more poorly in regard to her legs at this point. She tells me that she is still continue to have issues with drainage and this has been a little bit worse she was getting ready to start taking the Keflex again but wanted to see me first. Fortunately there is no signs of active infection at this time. No fevers, chills, nausea, vomiting, or diarrhea. 04/15/2020 upon evaluation today patient appears to be doing somewhat poorly in regard to her right leg. She tells me she has been having more pain she has been taking the Keflex that was previously prescribed unfortunately that just does not seem to help with this. She was hoping that the pain on her right was actually coming from the fact that she was having issues with her wrap having gotten caught in her recliner. With that being said she tells me that she knew something was not right. Currently her right leg is warm to touch along with being erythematous all the way up to around at least mid thigh as far as I can see. The left leg does not appear to be doing that badly though there is increased weeping around the ankle region. 05/06/2020 on evaluation today patient appears to be doing much better than last time I saw her. She did go to the hospital where she was admitted for 2 days and treated with antibiotic therapy. She was discharged with antibiotics as well and has done extremely well. I am extremely pleased with where things stand today. There is no signs of active infection at this time which is great news. 05/27/2020 upon evaluation today patient appears to be doing well with regard to her lower extremities  bilaterally. She has just a very tiny area on the right leg which is opening on the left leg she is significantly improved though she still has several areas that do appear to be open this is minimal compared to what is been in the past. In general I am extremely pleased with where things stand today. The patient does tell me she is not been using her pumps quite as much as [name], [name] ([id number]) [id number] Page 4 of 14 she should be. I do believe that is 1 area she can definitely work on. She has had a lot going on including a Covid exposure and apparently also a outbreak of likely shingles. 06/24/2020 upon evaluation today patient appears to be doing well with regard to her legs in general although the left leg unfortunately is showing some signs of erythema she does have a little bit of increased weeping and to be  honest I am concerned about infection here. I discussed that with her today and I think that we may need to address this sooner rather than later she has been taking Keflex she is not really certain that is been making a big improvement however. No fevers, chills, nausea, vomiting, or diarrhea. 06/30/2020 upon evaluation today patient's legs actually seem to be doing better in my opinion as compared to where they were last week. Fortunately there does not appear to be any signs of active infection. Her culture showed multiple organisms nothing predominate. With that being said the Levaquin seems to have done well I think she has improved since I last saw her as well. 07/14/2020 upon evaluation today patient appears to be doing actually better in regard to her lower extremities in my opinion. She has been tolerating the dressing changes without complication. Fortunately there is no signs of active infection at this time. 08/04/2020 upon evaluation today patient appears to be doing excellent in regard to her leg ulcers. Fortunately she has very little that is  open at this point. This is great news. In fact I think that the Goldbond medicated powder has been excellent for her. It seems to have done the trick where we had tried several other things without as much success. Fortunately there is no evidence of active infection at this time. No fevers, chills, nausea, vomiting, or diarrhea. 09/01/2020 upon evaluation today patient appears to be doing a little bit more poorly than the last time I saw her. She tells me right now that she has been having a lot of drainage compared to where things were previous which has unfortunately led to more irritation as well. She is concerned that this is leading to infection. Again based on what I am seeing today as well I am also concerned of the same to be honest. 09/15/2020 upon evaluation today patient appears to be doing well with regard to her legs compared to what I saw previous. Fortunately there does not appear to be any evidence of active infection at this time which is great news. At least not as badly as what it was previous. With that being said I do believe that the patient does need to have an extension of the antibiotics. This I believe will actually help her more in the way of making sure this stays under control and does not worsen. That was the Bactrim DS. Readmission: 08/01/2021 upon evaluation today patient appears to be doing actually pretty well all things considered. Is actually been almost a year since have seen her last March. Fortunately I do not see any evidence of active infection locally nor systemically at this point. She does have a couple open areas on the left leg the right leg appears to be doing quite well. In general she has been in the hospital 3 times since I saw her a year ago all 4 cellulitis type issues except for the last 1 which was actually more related to congestive heart failure for that reason she is not using her lymphedema pumps at this point and that is probably the  best idea. 08/08/2021 upon evaluation today patient appears to be doing well with regard to her legs all things considered her left leg may be a little bit more swollen based on what I see today. Fortunately I do not see any signs of active infection locally nor systemically at this time which is great news. No fevers, chills, nausea, vomiting, or diarrhea. 08/15/2021 upon evaluation today  patient actually appears to be doing excellent in regard to her wounds. She has been tolerating the dressing changes without complication. Fortunately I see no evidence of infection currently which is great news. No fevers, chills, nausea, vomiting, or diarrhea. 08/29/2021 upon evaluation patient appears to be doing excellent she in fact is almost completely healed. I am actually very pleased with where we stand today and I think that she is making wonderful progress she just has a small area of weeping on the right lateral leg everything else is pretty much completely healed which is also. 09/05/2021 upon evaluation today patient appears to be doing well with regard to her legs. She does have a small area of weeping on the right leg and there is a small area on the left leg as well. It is potentially something that may need to be addressed in the future more specifically but right now I think that there may have just been a little drainage here on the left. With all that being said I think that this still does not appear to be infected and overall I feel like she is doing quite well. That something we always have to keep a very close eye on as she can change very rapidly from okay to not okay. 09/12/2021 upon evaluation today patient appears to be doing a little bit worse in regard to her leg and actually somewhat concerned about the possibility of infection here. I discussed that with the patient. For that reason I am going to go ahead and see about getting her set up for a repeat prescription for the Bactrim. She is in  agreement with that plan. 3/14; patient presents for follow-up. She has been using silver alginate with dressing changes. She is still taking Bactrim prescribed at last clinic visit. She has no issues or complaints today. She has lymphedema pumps however does not use these. 09/26/2020 upon evaluation today patient appears to actually be doing well in regard to her right leg I am pleased in that regard. Unfortunately she has new areas on her left leg that are open at this point that I do need to be addressed. I am also concerned about the warmth around one of the wounds on the more anterior side. I think this is something that we may need to culture today potentially requiring antibiotics going forward. 10/03/2021 upon evaluation today patient unfortunately is not doing nearly as well as she was even last week with her wounds. She is having some issues here with increased cellulitis of the left lower extremity unfortunately. I did prescribe Augmentin for her eczema prescribed last week unfortunately she never actually received this prescription. The pharmacy stated they never got it. We double checked with them today they still did not receive it. I am not sure what happened in that regard. With that being said the patient is in good spirits today she does not appear to be as sick as where she normally is when the cellulitis gets significantly worse as it has in the past. Nonetheless the leg is much more red not just around the ankle where I was seeing at last week on Tuesday but rather now this is going all the way up to almost her knee and is definitely hot to touch compared to the right leg. Nonetheless I feel like she does need to go to the ER for likely IV antibiotics. 10-10-2021 upon evaluation today patient appears to be doing well with regard to her wound. She has been  tolerating the dressing changes that appears to be doing much better. After I saw her last week she did go to the ER they admitted  her to the hospital and gave her IV antibiotics she fortunately is doing significantly better. Overall I am extremely pleased with where things stand today. 10-17-2021 upon evaluation today patient appears to be doing well with regard to her wound. In fact this is looking better and her leg is looking better but she still does have some areas here of erythema. We will continue to address this as soon as possible in my opinion. I do believe she may require some debridement and what appears to be a more defined wound on her left leg at this point. 10-24-2021 upon evaluation patient is definitely showing signs of improvement which is great news. I do not see any evidence of active infection locally or systemically which is great news as well. No fevers, chills, nausea, vomiting, or diarrhea. 10-31-2021. Upon evaluation today patient's leg actually feels better from an infection and wound care perspective. I am actually very pleased in this regard. Unfortunately the biggest issue that I see at this time is that the patient is having a significant issue with her breathing. I did put her on the pulse ox machine to check her oxygen saturation and it was pretty much at 100% which is good but she tells me that when she is walking this is much more difficult for her. She also tells me that when she is trying to bend over to perform her dressing changes she is so short of breath due to the swelling and fluid in her abdominal area that she is just not been able to do this. She is tearful and very concerned today to be honest. I am truly sorry to see her this way I know she is really having a hard time at the moment. 11-14-2021 upon evaluation patient actually appears to be doing significantly better compared to when I last saw her. She was actually admitted to the hospital I believe this for 6 days total after I sent her on 10-31-2021. Subsequently they actually pulled off greater than 50 pounds of fluid she tells me  she feels significantly better her legs are not as tight she actually has some play in the tissue and it is obvious that she is doing much better just from a mobility [name], [name] ([id number]) [id number] Page 5 of 14 standpoint as well as a breathing standpoint. Overall I am extremely happy for her and how she is doing the leg also looks much better on the left. 11-21-2021 upon evaluation today patient appears to be doing well although it does appear that she is going require some sharp debridement the wound is very dry this is the opposite of what its been she was having so much weeping that it was staying extremely wet. Nonetheless we do need to see about going ahead and debriding the wound today. 11-28-2021 upon evaluation today patient appears to be doing well with regard to her wound I definitely see signs of things continuing to improve which is great news. I do not see any evidence of active infection locally or systemically also great news. 12-05-2021 upon evaluation today patient appears to be doing well with regard to her wound. She has been tolerating the dressing changes. Fortunately there does not appear to be any signs of active infection locally or systemically at this time. No fevers, chills, nausea, vomiting, or diarrhea. 12-12-2021 upon evaluation  patient appears to be doing well with regard to her wound this is actually measuring smaller and looking much better. Fortunately I do not see any signs of active infection locally or systemically at this time which is excellent news. 6/13; small wound area in the folds of her lymphedema just above the left ankle. Fortunately the area looks quite good. She has been using silver alginate ABDs and an Ace wrap which she is able to change her self. She is not using her compression pumps out of fear that this could contribute to heart failure. 12-26-2021 upon evaluation patient's wounds are actually showing signs of  excellent improvement. I am very pleased with where things stand. Overall I think that she has been making excellent progress and I think we are very close to complete resolution which is great news. 01-11-2022 upon evaluation today patient appears to be doing well currently in regard to her legs in general although on the left leg she does have 1 area that still irritated and inflamed on the anterior portion of her leg. Fortunately I do not see any signs of systemic infection locally there might be some infection going on here however which is my biggest concern. Fortunately I do not see any evidence though of this spreading to any other location. 01-16-2022 upon evaluation today patient has actually showing signs of excellent improvement. Fortunately I do not see any evidence of infection locally or systemically which is great news and overall I am very pleased with where we stand at this point. 01-25-2022 upon evaluation today patient actually appears to be doing decently well in regard to her legs. She has been tolerating the dressing changes without complication. Fortunately there does not appear to be any evidence of active infection locally or systemically which is great news and overall I am extremely pleased with where we stand at this point. 02-13-2022 upon evaluation today patient appears to be doing well currently in regard to her wound which is actually showing signs of significant improvement. Fortunately I do not see any evidence of active infection locally or systemically at this time. No fevers, chills, nausea, vomiting, or diarrhea. 02-20-2022 upon evaluation today patient appears to be doing well currently in regard to her wound. She has been tolerating the dressing changes without complication. Fortunately there does not appear to be any signs of active infection locally or systemically at this time. No fevers, chills, nausea, vomiting, or diarrhea. 02-27-2022 upon evaluation today patient  appears to be doing excellent in regard to her wounds on the leg. She has been tolerating the dressing changes without complication this is very close to complete resolution. Fortunately I do not see any signs of infection locally or systemically at this time which is great news. No fevers, chills, nausea, vomiting, or diarrhea. 03-06-2022 upon evaluation today patient appears to be doing well currently in regard to her wound in general. She has been tolerating the dressing changes without complication. Fortunately I do not see any evidence of active infection locally or systemically at this time which is great news. I am concerned a little bit however about not really her wounds but the second toe right foot where there appears to be some cellulitis after she dropped a frying pan on this about 2 weeks ago. She tells me its been itching and burning and I do think that it is possible she may have an infection based on what we are seeing currently. 03-20-2022 upon evaluation today patient actually appears to be  doing much better at this point. Fortunately there does not appear to be any signs of active infection locally or systemically at this time which is great news. No fevers, chills, nausea, vomiting, or diarrhea. 03-27-2022 upon evaluation today patient appears to be doing somewhat poorly in regard to her legs compared to previous. Her right leg has a couple areas that are open and she is very warm and painful to touch around the lateral portion of the ankle area. Left leg is showing signs of little bit more drainage and weeping as well I think that this is probably due to the fact that she is swelling a lot more that she has been in the past. Fortunately I do not see any signs of active infection systemically though locally there is definitely some issues here. 04-03-2022 upon evaluation today patient appears to be doing poorly in regard to her wounds on the legs unfortunately. She also seems to have  more significant cellulitis that has ensued. Unfortunately she has been on doxycycline initially it seemed to be helping but as of this week that is no longer the case. It started getting worse last week and with the reinitiation of the doxycycline nothing has improved. Often when she gets like this IV antibiotics are necessary. 04-19-2022 upon evaluation today patient appears to be doing well currently in regard to her legs from an infection standpoint. She did get IV vancomycin in the hospital and states that her legs did get better when she left she was no longer weeping but she had been in the bed for 4 days straight. With that being said since coming home her legs have begun to weep again the original wound that we were dealing with has healed she does have some weeping over both lower extremities however which is still of concern. 05-03-2022 upon evaluation today patient appears to be doing well currently in regard to her left leg which I feel like is a little bit better unfortunately the right leg is still draining significantly. Fortunately I do not see any signs of infection locally or systemically at this time. No fevers, chills, nausea, vomiting, or diarrhea. 05-10-2022 unfortunately patient actually showed signs of increasing erythema today. I believe that the cellulitis never completely resolved from when she was in the hospital last. Again she has been on doxycycline previously by myself I gave her Keflex more recently as an ongoing prescription to try to prevent infection but again I do not think this is strong enough to be able to get this under control. I really feel like that she is probably can need IV antibiotics and a good period of time in the hospital to be able to keep her legs elevated and not having to get around to do a whole lot in order to allow this to resolve. The patient voiced understanding. 05-24-2022 upon evaluation today patient appears to be doing excellent in  regard to her legs. She has been tolerating the dressing changes without complication. Fortunately there does not appear to be any signs of infection at this time which is great news she has been on cefadroxil since she was discharged from the hospital. The day that we sent her she actually tells me that she ended up in septic shock that day and coded. When she first showed up they did not notice it on the lab work but said by the end of the day she had already undergone this and they had had to address the issue obviously when  she coded. She tells me that likely she would not be here today if she had not gone to the hospital that day. I explained to her that I do believe that was a God thing and it was just the perfect timing that he had for her at that time. 06-14-2022 upon evaluation today patient appears to be doing well currently in regard to her legs. In general I think she is making good progress and I do not see any signs of infection at this time which is great news. No fevers, chills, nausea, vomiting, or diarrhea. 06-28-2022 upon evaluation today patient appears to be doing excellent in regard to her legs. She is actually showing signs of excellent improvement I do not see any evidence of infection locally nor systemically at this time. No fevers, chills, nausea, vomiting, or diarrhea. 07-26-2022 upon eval evaluation today patient appears to be doing well currently in regard to her wound. She has been tolerating the dressing changes without complication. Fortunately I do not see any major issues here she has a couple areas of weeping mainly on the right leg the left leg seems to be doing pretty SELETA, ALEMAYEHU ([id number]) [id number] Page 6 of 14 well. 08-23-2021 upon evaluation today patient appears to be doing excellent in regard to her legs. These are showing signs of wonderful improvement I am actually very pleased with where we stand I think she is doing the  best that I have ever seen her. Fortunately I do not see any evidence of infection locally or systemically which is great news. 09-27-2022 upon evaluation today patient appears to be doing well she has no open wounds at this time her lymphedema seems to be doing quite well at this point. Fortunately I do not see any signs of active infection locally nor systemically which is great news and overall her swelling though up a little bit is not nearly as bad as it has been at times. No evidence of cellulitis currently. 10-25-2022 upon evaluation patient appears to be doing excellent in regard to her wounds currently. I do not see anything open that this is great her leg still has the bilateral lymphedema but again this is not nearly as severe as what it has been in the past I think she is doing really well. She is using the pumps when she can although she does tell me there is been some issue there with her arthritis at times not being able to do what she needs to do. 11-27-2022 upon evaluation today patient appears to be doing well currently in regard to her legs which do not show any signs of opening she still has significant lymphedema bilaterally. Fortunately I do not see any evidence of active infection locally nor systemically which is great news. Electronic Signature(s) Signed: 11/27/2022 1:29:24 PM By: Allen Derry PA-C Entered By: Allen Derry on 11/27/2022 13:29:24 -------------------------------------------------------------------------------- Physical Exam Details Patient Name: Date of Service: AYANNI, [facility] 11/27/2022 10:45 A M Medical Record Number: [id number] Patient Account Number: [id number] Date of Birth/Sex: Treating RN: June 28, 1956 (67 y.o. [name] Primary Care Provider: [name] Crutch Other Clinician: Betha Loa Referring Provider: Treating Provider/Extender: Wynona Dove in Treatment: 2 Constitutional Obese and well-hydrated in no acute  distress. Respiratory normal breathing without difficulty. Psychiatric this patient is able to make decisions and demonstrates good insight into disease process. Alert and Oriented x 3. pleasant and cooperative. Notes Upon inspection patient's wounds again are completely healed she has nothing open  at the moment which is great news. She does have bilateral significant primary lymphedema and she does have some new wraps that she got that she is going to be trying I do believe this is hopefully to be helpful for her as well. Electronic Signature(s) Signed: 11/27/2022 1:29:43 PM By: Allen Derry PA-C Entered By: Allen Derry on 11/27/2022 13:29:42 Physician Orders Details -------------------------------------------------------------------------------- [name] ([id number]) [id number] Page 7 of 14 Patient Name: Date of Service: [name], [name] 11/27/2022 10:45 A M Medical Record Number: [id number] Patient Account Number: [id number] Date of Birth/Sex: Treating RN: 31-May-1956 (67 y.o. 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 Wash wounds with antibacterial soap and water. May shower; gently cleanse wound with antibacterial soap, rinse and pat dry prior to dressing wounds No tub bath. Edema Control - Lymphedema /  Segmental Compressive Device / Other Elevate, Exercise Daily and A void Standing for Long Periods of Time. Elevate leg(s) parallel to the floor when sitting. DO YOUR BEST to sleep in the bed at night. DO NOT sleep in your recliner. Long hours of sitting in a recliner leads to swelling of the legs and/or potential wounds on your backside. Other: - use lymphedema pumps Non-Wound Condition Bilateral Lower Extremities Cleanse affected area with antibacterial soap and water, Medications-Please add to medication list. ntibiotic - apply Mupirocin to weepy areas as needed Topical A Electronic Signature(s) Signed: 11/27/2022 1:52:16 PM By: Betha Loa Signed: 11/27/2022 4:38:31 PM By: Allen Derry PA-C Entered By: Betha Loa on 11/27/2022 11:12:25 -------------------------------------------------------------------------------- Problem List Details Patient Name: Date of Service: [name] 11/27/2022 10:45 A M Medical Record Number: [id number] Patient Account Number: [id number] Date of Birth/Sex: Treating RN: 08/28/1955 (67 y.o. 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 [name] Primary Care Provider: [name] Crutch Other Clinician: Betha Loa Referring Provider: Treating Provider/Extender: Wynona Dove in Treatment: 6 Subjective Chief Complaint Information obtained from Patient Bilateral LE lymphedema with Left LE Ulcers History of Present Illness (HPI) The patient is a 67 year old female with history of hypertension and a long-standing history of bilateral lower extremity lymphedema (first presented on 4/2) . She has had open ulcers  in the past which have always responded to compression therapy. She had briefly been to a lymphedema clinic in the past which helped her at the time. this time around she stopped treatment of her lymphedema pumps approximately 2 weeks ago because of some pain in the knees and then noticed the right leg getting worse. She was seen by her PCP who put her on clindamycin 4 times a day 2 days ago. The patient has seen [facility] and Dr. Gilda Crease had seen her last year where a vascular study including venous and arterial duplex studies were within normal limits. he had recommended compression stockings and lymphedema pumps and the patient has been using this in about 2 weeks ago. She is known to be diabetic but in the past few time she's gone to her primary care doctor her hemoglobin A1c has been normal. 02/11/2015 - after her last visit she took my advice and went to the ER regarding the progressive cellulitis of her right lower extremity and she was admitted between July 17 and 22nd. She received IV antibiotics and then was sent home on a course of steroid-induced and oral antibiotics. She has improved  much since then. 02/17/2015 -- she has been doing fine and the weeping of her legs has remarkably gone down. She has no fresh issues. READMISSION 01/15/18 This patient was given this clinic before most recently in 2016 seen by Dr. Meyer Russel. She has massive bilateral lymphedema and over the last 2 months this had weeping edema out of the left leg. She has compression pumps but her compliance with these has been minimal. She has [facility] they've been using TCA/ABDs/kerlix under an Ace wrap.she has had recent problems with cellulitis. She was apparently seen in the ER and 12/23/17 and given clindamycin. She was then followed by her primary doctor and given doxycycline and Keflex. The pain seems to have settled down. In April 2018 the patient had arterial studies done at [facility]. This showed triphasic waveforms throughout the right leg and mostly triphasic waveforms on the left except for monophasic at the posterior tibial artery distally. She was not felt to have evidence of right lower extremity arterial stenosis or significant problems on the left side. 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 She also had a right lower extremity venous Doppler in January 2018 this was limited by the patient's body habitus and lymphedema. Most of the proximal veins were not visualized ATZHIRI, RATHSACK ([id number]) [id number] Page 9 of 14 The patient presents with an area of denuded skin on the anterior medial part of the left calf. There is weeping edema fluid here. 01/22/18; the patient has somewhat better edema control using her compression pumps twice a day and as a result she has much better epithelialization on the left anterior calf area. Only a small open area remains. 01/29/18; the patient has been compliant with her compression pumps. Both the areas on her calf that healed. The remaining area on the left anterior leg is  fully epithelialized Readmission: 02/20/2019 upon evaluation today patient presents for reevaluation due to issues that she is having with the bilateral lower extremities. She actually has wounds open on both legs. On the right she has an area in the crease of her leg on the right around the knee region which is actually draining quite a bit and actually has some fungal type appearance to it. She has been on nystatin powder that seems to have helped to some  degree. In regard to the left lower extremity this is actually in the lower portion of her leg closer to the ankle and again is continuing to drain as well unfortunately. There does not appear to be any signs of active infection at this time which is good news. No fevers, chills, nausea, vomiting, or diarrhea. She tells me that since she was seen last year she is actually been doing quite well for the most part with regard to her lower extremities. Unfortunately she now is experiencing a little bit more drainage at this time. She is concerned about getting this under control so that it does not get significantly worse. 02/27/2019 on evaluation today patient appears to be doing somewhat better in regard to her bilateral lower extremity wounds. She has been tolerating the dressing changes without complication. Fortunately there is no signs of active infection at this point. No fevers, chills, nausea, vomiting, or diarrhea. She did get her dressing supplies which is excellent news she was extremely excited to get these. She also got paperwork from [employer] for their financial assistance program where they may be able to help her out in the future if needed with supplies at discounted prices. 03/06/2019 on evaluation today patient appears to be doing a little worse with regard to both areas of weeping on her bilateral lower extremities. This is around the right medial knee and just above the left ankle. With that being said she is unfortunately not doing as  well as I would like to see. I feel like she may need to potentially go see someone at the lymphedema clinic as the wraps that she needs or even beyond what we can do here at the wound care center. She really does not have wounds she just has open areas of weeping that are causing some difficulty for her. Subsequently because of this and the moisture I am concerned about the potential for infection I am going to likely give her a prophylactic antibiotic today, Keflex, just to be on the safe side. Nonetheless again there is no obvious signs of active infection at this time. 03/13/2019 on evaluation today patient appears to be doing well with regard to her bilateral lower extremities where she has been weeping compared to even last week's evaluation. I see some areas of new skin growth which is excellent and overall I am very pleased with how things seem to be progressing. No fevers, chills, nausea, vomiting, or diarrhea. 03/20/2019 on evaluation today patient unfortunately is continuing to have issues with significant edema of the left lower extremity. Her right side seems to be doing much better. Unfortunately her left side is showing increased weeping of the lower portion of her leg. This is quite unfortunate obviously we were hoping to get her into the lymphedema clinic they really do not seem to when I see her how if she is draining. Despite the fact this is really not wound related but more lymphedema weeping related. Nonetheless I do not know that this can be helpful for her to even go for that appointment since again I am not sure there is much that they would actually do at this point. We may need to try a 4 layer compression wrap as best we can on her leg. She is on the Augmentin currently although I am still concerned about whether or not there could be potentially something going on infection wise I would obtain a culture though I understand is not the best being that is a surface  culture I just  1 to make sure I do not seem to be missing anything. 03/27/2019 on evaluation today patient appears to be doing much better in regard to the left lower extremity compared to last week. Last week she had tremendous weeping which I think was subsequent to infection now she seems to be doing much better and very pleased. This is not completely healed but there is a lot of new skin growth and it has dried out quite a bit. Overall I think that we are doing well with how things are moving along at this time. No fevers, chills, nausea, vomiting, or diarrhea. 04/03/2019 evaluation today patient appears to be doing a little worse this week compared to last time I saw her. I think this may be due to the fact that she is having issues with not being able to sleep in her bed at least not until last night. She is therefore been in a lift chair and subsequently has also had issues with not been able to use her pumps since she could not get in bed. With that being said the patient overall seems to be doing okay I do think I may want extend the antibiotic for a little bit longer at least until we can see if her edema and her weeping gets better and if it is then obviously I can always discontinue the antibiotics as of next week however I want her to continue to have it over the next week. 04/10/2019 on evaluation today patient unfortunately is still doing poorly with regard to her left lower extremity. Her right is all things considering doing fairly well. On the left however she continues to have spreading of the area of infection and weeping which appears to be even a larger surface area than noted last week. She did have a positive culture for Pseudomonas in particular which seems to have been of concern she still has green/yellow discharge consistent with Pseudomonas and subsequently a tremendous amount of it. This has me obviously still concerned about the infection not really clearing up despite the fact that on  culture it appears the Cipro should have been a good option for treating this. I think she may at this point need IV antibiotics since things are not doing better I do not want to get worse and cause sepsis. She is in agreement with the plan and believes as well that she likely does need to go to the hospital for IV vancomycin. Or something of the like depending on what the recommendation is from the ER. 04/17/2019 on evaluation today patient appears to be doing excellent in regard to her lower extremity on the left. She was in the hospital for several days from when I sent her last we saw her until just this past Tuesday. Fortunately her drainage is significantly improved and in fact is mostly clear. There is just a couple small areas that may still drain a little bit she states that the [facility] they prescribed for her at discharge she went picked up from pharmacy and got home but has not been able to find it since. She is looked everywhere. She is wondering if I will replace that for her today I will be more than happy to do that. 05/01/2019 on evaluation today patient actually appears to be doing quite well with regard to her lower extremities. She occasionally is having areas that will leak and then heal up mainly when a piece of the fibrotic skin pops off but  fortunately she is not having any signs of active infection at this time. Overall she also really does not have any obvious weeping at this time. I do believe however she really needs some compression wraps and I think this may be a good time to get her back to the lymphedema clinic. 05/11/2019 on evaluation today patient actually appears to be doing quite well with regard to her bilateral lower extremities. She occasionally will have a small area that we per another but in general seems to be completely healed which is great news. Overall very pleased with how everything seems to be progressing. She does have her appointment with lymphedema  clinic on November 18. 05/25/2019 on evaluation today patient appears to be doing well with regard to her left lower extremity. I am very pleased in this regard. In regard to her right leg this actually did start draining more I think it is mainly due to the fact that her leg is more swollen. I am not seeing any obvious signs of infection at this time although that is definitely something were obviously acutely aware of simply due to the fact that she had an issue not too far back with exactly this issue. Nonetheless I do feel like that lymphedema clinic would still be beneficial for her. I explained obviously if they are not able to do anything treatment wise on the right leg we could at least have them treat her left leg and then proceed from there. The patient is really in agreement with that plan. If they are able to do both as the drainage slows down that I would be happy to let them handle both. 06/01/2019 on evaluation today patient unfortunately appears to be doing worse with regard to her right lower extremity. The left lower extremity is still maintaining at this point. Unfortunately she has been having significantly increased pain over the past several days and has been experiencing as well increased swelling of the right lower extremity. I really do not know that I am seeing anything that appears to be obvious for infection at this point to be peripherally honest. With that being said the patient does seem to be having much more swelling that she is even experienced in the past and coupled with increased pain in her hip as well I am concerned that again she could potentially have a DVT although I am not 100% sure of this. I think it something that may need to be checked out. We discussed the possibility of sending her for a DVT study through the hospital but unfortunately transportation is an issue if she does have a DVT I do not want her to wait days to be able to get in for that test  however if she has this scheduled as an outpatient that is as fast that she will be able to get the test scheduled for transportation purposes. That will also fall on Thanksgiving so subsequently she did actually be looking at either Friday or even next week before we would know anything back from this. That is much too long in my opinion. Subsequent to the amount of discomfort she is experiencing the patient is actually okay with going to the ER for evaluation today. 06/12/2019 on evaluation today patient actually appears to be doing significantly better compared to last time I saw her. Following when I last saw her she was [name], [name] ([id number]) [id number] Page 10 of 14 actually in the hospital from that Monday until the following Sunday  almost 1 full week. She actually was placed on Keflex in the hospital following the time for her to be discharged and Dr. Joylene Draft has recommended 2 times a day dosing of the Keflex for the next year in order to help with more prophylactic/preventative measures with regard to her developing cellulitis. Overall I think this sounds like an excellent plan. The patient unfortunately is good to have trouble being treated at lymphedema clinic due to the fact that she really cannot get up on the bed that they have there. They also state that they cannot manage her as long as she has anything draining at this point. Obviously that is somewhat unfortunate as she does need help with edema control but nonetheless we will have to do what we can for her outside of it sounds like the lymphedema clinic scenario at this point. 06/19/2019 on evaluation today patient appears to be doing fairly well with regard to her bilateral lower extremities. She is not nearly as swollen and shows no signs of infection at this point. There is no evidence of cellulitis whatsoever. She also has no open wounds or draining at this point which is also good news.  No fever chills noted. She seems to be in very good spirits and in fact appears to be doing quite well. READMISSION 11/27/2019 This is a 67 year old woman that we have had in this clinic several times before including 2015, 16 and 19 and then most recently from 03/20/2019 through 06/19/2019 with bilateral lower extremity lymphedema. She has had previous arterial and reflux studies done years ago which were not all that remarkable. 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 She does have a positive family history and she had large legs starting may be in her 64s. She was recently in hospital from 10/20/2019 through 10/28/2019 with right leg cellulitis. She was given Ancef and clindamycin and then Zosyn when a culture showed Pseudomonas. At that time there was purulent drainage. She was followed by infectious disease Dr Joylene Draft. The patient is now back at home. She has noted increased swelling in the right and no drainage in her right leg mostly on the posterior medial aspect in the calf area. She has not had pain or fever. She has literally been improved lysing above dressings because her at the area of this is far too large for standard compression. She has been wrapping the areas with sheets to resorptive pads. She is found these helped somewhat. She does have an appointment with the lymphedema clinic in [place] in late June. Past medical history includes bilateral lymphedema, hypertension, obstructive sleep apnea with CPAP. Recent hospitalization with apparently Pseudomonas cellulitis of the right lower leg 12/15/2019 upon evaluation today patient appears to be doing a little bit worse in regard to her right lower extremity. Unfortunately she is having more weeping down in the lower portion of her leg. Fortunately there is no signs of active infection at this time. No fever chills noted. The patient states she is not having increased pain  except for when she attempted to use the lymphedema pumps unfortunately she states that she did have pain when she did this. Otherwise we been using absorptive dressings of one type or another she is using diapers at home and then subsequently Ace wraps. In regard to the barrier cream we have discussed the possibility of derma cloud which she would like to try I do not have a problem with that. 12/22/2019 upon  evaluation today patient actually appears to be doing better in regard to her leg ulcers at this point. Fortunately there does not appear to be any signs of active infection which is great news and I am extremely pleased with where things are progressing at this time. There is no sign of active infection currently. The patient is very pleased to see things doing so well. 12/29/2019 upon evaluation today patient appears to be doing a little bit better in regard to her weeping in general over her lower extremities. She does have some signs of mild erythema little bit more than what I noted last week or rather last visit. Nonetheless I think that my threshold for switching her antibiotics from Keflex to something else is very low at this point considering that she has had such severe infections in the past that seem to come almost out of nowhere. There is a little erythema and warmth noted of the lower portion of her leg compared to the upper which also makes me want to go ahead and address things more rapidly at this point. Likely I would switch out the Keflex for something like Levaquin ideally. 7/16; patient with severe bilateral lymphedema. She has superficial wounds albeit almost circumferential now on the left lateral lower leg. This may be new from last time. Small area on the right anterior lower leg and then another area on the right medial lower leg and of pannus fold. She has been using various absorptive garments. She states she is using her compression pumps once a day occasionally twice.  Culture from her last visit here was negative 01/29/2020 on evaluation today patient appears to be doing excellent at this point in regard to her legs with regard to infection I see no signs of active infection at this point. She still does have unfortunately areas of weeping this is minimal on the right now her left is actually significantly worse although I do not think it is as bad as last week with Dr. Leanord Hawking saw her. She has been trying to pump and elevate her legs is much as possible. She has previously been on the Keflex and in the past for prevention that seems to do fairly well and likely can extend that today. 02/04/2020 on evaluation today patient appears to be doing better in regard to her legs bilaterally. Fortunately there is no signs of active infection at this time which is great news and overall she has less weeping on the left compared to the right and there is several spots where she is pretty much sealed up with no draining regions. Overall very pleased in this regard. 02/19/2020 on evaluation today patient appears to be doing very well in regard to her wounds currently. Fortunately there is no evidence of active infection overall very pleased with where things stand. She is significantly improved in regard to her edema I am extremely pleased in this regard she tells me that the popping no longer hurts and in fact she actually looks forward to it. 03/04/2020 on evaluation today patient appears to be doing excellent in regard to her lower extremities. Fortunately there is no signs of active infection at this time. No fevers, chills, nausea, vomiting, or diarrhea. 03/25/2020 on evaluation today patient appears to be doing a little bit more poorly in regard to her legs at this point. She tells me that she is still continue to have issues with drainage and this has been a little bit worse she was getting ready to start  taking the Keflex again but wanted to see me first. Fortunately there is  no signs of active infection at this time. No fevers, chills, nausea, vomiting, or diarrhea. 04/15/2020 upon evaluation today patient appears to be doing somewhat poorly in regard to her right leg. She tells me she has been having more pain she has been taking the Keflex that was previously prescribed unfortunately that just does not seem to help with this. She was hoping that the pain on her right was actually coming from the fact that she was having issues with her wrap having gotten caught in her recliner. With that being said she tells me that she knew something was not right. Currently her right leg is warm to touch along with being erythematous all the way up to around at least mid thigh as far as I can see. The left leg does not appear to be doing that badly though there is increased weeping around the ankle region. 05/06/2020 on evaluation today patient appears to be doing much better than last time I saw her. She did go to the hospital where she was admitted for 2 days and treated with antibiotic therapy. She was discharged with antibiotics as well and has done extremely well. I am extremely pleased with where things stand today. There is no signs of active infection at this time which is great news. 05/27/2020 upon evaluation today patient appears to be doing well with regard to her lower extremities bilaterally. She has just a very tiny area on the right leg which is opening on the left leg she is significantly improved though she still has several areas that do appear to be open this is minimal compared to what is been in the past. In general I am extremely pleased with where things stand today. The patient does tell me she is not been using her pumps quite as much as she should be. I do believe that is 1 area she can definitely work on. She has had a lot going on including a Covid exposure and apparently also a outbreak of likely shingles. 06/24/2020 upon evaluation today patient appears to  be doing well with regard to her legs in general although the left leg unfortunately is showing some signs of erythema she does have a little bit of increased weeping and to be honest I am concerned about infection here. I discussed that with her today and I think that we may need to address this sooner rather than later she has been taking Keflex she is not really certain that is been making a big improvement however. No fevers, chills, nausea, vomiting, or diarrhea. 06/30/2020 upon evaluation today patient's legs actually seem to be doing better in my opinion as compared to where they were last week. Fortunately there does not appear to be any signs of active infection. Her culture showed multiple organisms nothing predominate. With that being said the Levaquin seems to have done well I think she has improved since I last saw her as well. [name], [name] ([id number]) [id number] Page 11 of 14 07/14/2020 upon evaluation today patient appears to be doing actually better in regard to her lower extremities in my opinion. She has been tolerating the dressing changes without complication. Fortunately there is no signs of active infection at this time. 08/04/2020 upon evaluation today patient appears to be doing excellent in regard to her leg ulcers. Fortunately she has very little that is open at this point. This is great news. In fact I  think that the Goldbond medicated powder has been excellent for her. It seems to have done the trick where we had tried several other things without as much success. Fortunately there is no evidence of active infection at this time. No fevers, chills, nausea, vomiting, or diarrhea. 09/01/2020 upon evaluation today patient appears to be doing a little bit more poorly than the last time I saw her. She tells me right now that she has been having a lot of drainage compared to where things were previous which has unfortunately led to more irritation as  well. She is concerned that this is leading to infection. Again based on what I am seeing today as well I am also concerned of the same to be honest. 09/15/2020 upon evaluation today patient appears to be doing well with regard to her legs compared to what I saw previous. Fortunately there does not appear to be any evidence of active infection at this time which is great news. At least not as badly as what it was previous. With that being said I do believe that the patient does need to have an extension of the antibiotics. This I believe will actually help her more in the way of making sure this stays under control and does not worsen. That was the Bactrim DS. Readmission: 08/01/2021 upon evaluation today patient appears to be doing actually pretty well all things considered. Is actually been almost a year since have seen her last March. Fortunately I do not see any evidence of active infection locally nor systemically at this point. She does have a couple open areas on the left leg the right leg appears to be doing quite well. In general she has been in the hospital 3 times since I saw her a year ago all 4 cellulitis type issues except for the last 1 which was actually more related to congestive heart failure for that reason she is not using her lymphedema pumps at this point and that is probably the best idea. 08/08/2021 upon evaluation today patient appears to be doing well with regard to her legs all things considered her left leg may be a little bit more swollen based on what I see today. Fortunately I do not see any signs of active infection locally nor systemically at this time which is great news. No fevers, chills, nausea, vomiting, or diarrhea. 08/15/2021 upon evaluation today patient actually appears to be doing excellent in regard to her wounds. She has been tolerating the dressing changes without complication. Fortunately I see no evidence of infection currently which is great news. No  fevers, chills, nausea, vomiting, or diarrhea. 08/29/2021 upon evaluation patient appears to be doing excellent she in fact is almost completely healed. I am actually very pleased with where we stand today and I think that she is making wonderful progress she just has a small area of weeping on the right lateral leg everything else is pretty much completely healed which is also. 09/05/2021 upon evaluation today patient appears to be doing well with regard to her legs. She does have a small area of weeping on the right leg and there is a small area on the left leg as well. It is potentially something that may need to be addressed in the future more specifically but right now I think that there may have just been a little drainage here on the left. With all that being said I think that this still does not appear to be infected and overall I feel like  she is doing quite well. That something we always have to keep a very close eye on as she can change very rapidly from okay to not okay. 09/12/2021 upon evaluation today patient appears to be doing a little bit worse in regard to her leg and actually somewhat concerned about the possibility of infection here. I discussed that with the patient. For that reason I am going to go ahead and see about getting her set up for a repeat prescription for the Bactrim. She is in agreement with that plan. 3/14; patient presents for follow-up. She has been using silver alginate with dressing changes. She is still taking Bactrim prescribed at last clinic visit. She has no issues or complaints today. She has lymphedema pumps however does not use these. 09/26/2020 upon evaluation today patient appears to actually be doing well in regard to her right leg I am pleased in that regard. Unfortunately she has new areas on her left leg that are open at this point that I do need to be addressed. I am also concerned about the warmth around one of the wounds on the more anterior side. I  think this is something that we may need to culture today potentially requiring antibiotics going forward. 10/03/2021 upon evaluation today patient unfortunately is not doing nearly as well as she was even last week with her wounds. She is having some issues here with increased cellulitis of the left lower extremity unfortunately. I did prescribe Augmentin for her eczema prescribed last week unfortunately she never actually received this prescription. The pharmacy stated they never got it. We double checked with them today they still did not receive it. I am not sure what happened in that regard. With that being said the patient is in good spirits today she does not appear to be as sick as where she normally is when the cellulitis gets significantly worse as it has in the past. Nonetheless the leg is much more red not just around the ankle where I was seeing at last week on Tuesday but rather now this is going all the way up to almost her knee and is definitely hot to touch compared to the right leg. Nonetheless I feel like she does need to go to the ER for likely IV antibiotics. 10-10-2021 upon evaluation today patient appears to be doing well with regard to her wound. She has been tolerating the dressing changes that appears to be doing much better. After I saw her last week she did go to the ER they admitted her to the hospital and gave her IV antibiotics she fortunately is doing significantly better. Overall I am extremely pleased with where things stand today. 10-17-2021 upon evaluation today patient appears to be doing well with regard to her wound. In fact this is looking better and her leg is looking better but she still does have some areas here of erythema. We will continue to address this as soon as possible in my opinion. I do believe she may require some debridement and what appears to be a more defined wound on her left leg at this point. 10-24-2021 upon evaluation patient is definitely  showing signs of improvement which is great news. I do not see any evidence of active infection locally or systemically which is great news as well. No fevers, chills, nausea, vomiting, or diarrhea. 10-31-2021. Upon evaluation today patient's leg actually feels better from an infection and wound care perspective. I am actually very pleased in this regard. Unfortunately the biggest  issue that I see at this time is that the patient is having a significant issue with her breathing. I did put her on the pulse ox machine to check her oxygen saturation and it was pretty much at 100% which is good but she tells me that when she is walking this is much more difficult for her. She also tells me that when she is trying to bend over to perform her dressing changes she is so short of breath due to the swelling and fluid in her abdominal area that she is just not been able to do this. She is tearful and very concerned today to be honest. I am truly sorry to see her this way I know she is really having a hard time at the moment. 11-14-2021 upon evaluation patient actually appears to be doing significantly better compared to when I last saw her. She was actually admitted to the hospital I believe this for 6 days total after I sent her on 10-31-2021. Subsequently they actually pulled off greater than 50 pounds of fluid she tells me she feels significantly better her legs are not as tight she actually has some play in the tissue and it is obvious that she is doing much better just from a mobility standpoint as well as a breathing standpoint. Overall I am extremely happy for her and how she is doing the leg also looks much better on the left. 11-21-2021 upon evaluation today patient appears to be doing well although it does appear that she is going require some sharp debridement the wound is very dry this is the opposite of what its been she was having so much weeping that it was staying extremely wet. Nonetheless we do need  to see about going ahead and debriding the wound today. 11-28-2021 upon evaluation today patient appears to be doing well with regard to her wound I definitely see signs of things continuing to improve which is great news. I do not see any evidence of active infection locally or systemically also great news. 12-05-2021 upon evaluation today patient appears to be doing well with regard to her wound. She has been tolerating the dressing changes. Fortunately there does not appear to be any signs of active infection locally or systemically at this time. No fevers, chills, nausea, vomiting, or diarrhea. [name], [name] ([id number]) [id number] Page 12 of 14 12-12-2021 upon evaluation patient appears to be doing well with regard to her wound this is actually measuring smaller and looking much better. Fortunately I do not see any signs of active infection locally or systemically at this time which is excellent news. 6/13; small wound area in the folds of her lymphedema just above the left ankle. Fortunately the area looks quite good. She has been using silver alginate ABDs and an Ace wrap which she is able to change her self. She is not using her compression pumps out of fear that this could contribute to heart failure. 12-26-2021 upon evaluation patient's wounds are actually showing signs of excellent improvement. I am very pleased with where things stand. Overall I think that she has been making excellent progress and I think we are very close to complete resolution which is great news. 01-11-2022 upon evaluation today patient appears to be doing well currently in regard to her legs in general although on the left leg she does have 1 area that still irritated and inflamed on the anterior portion of her leg. Fortunately I do not see any signs of systemic infection  locally there might be some infection going on here however which is my biggest concern. Fortunately I do not see any  evidence though of this spreading to any other location. 01-16-2022 upon evaluation today patient has actually showing signs of excellent improvement. Fortunately I do not see any evidence of infection locally or systemically which is great news and overall I am very pleased with where we stand at this point. 01-25-2022 upon evaluation today patient actually appears to be doing decently well in regard to her legs. She has been tolerating the dressing changes without complication. Fortunately there does not appear to be any evidence of active infection locally or systemically which is great news and overall I am extremely pleased with where we stand at this point. 02-13-2022 upon evaluation today patient appears to be doing well currently in regard to her wound which is actually showing signs of significant improvement. Fortunately I do not see any evidence of active infection locally or systemically at this time. No fevers, chills, nausea, vomiting, or diarrhea. 02-20-2022 upon evaluation today patient appears to be doing well currently in regard to her wound. She has been tolerating the dressing changes without complication. Fortunately there does not appear to be any signs of active infection locally or systemically at this time. No fevers, chills, nausea, vomiting, or diarrhea. 02-27-2022 upon evaluation today patient appears to be doing excellent in regard to her wounds on the leg. She has been tolerating the dressing changes without complication this is very close to complete resolution. Fortunately I do not see any signs of infection locally or systemically at this time which is great news. No fevers, chills, nausea, vomiting, or diarrhea. 03-06-2022 upon evaluation today patient appears to be doing well currently in regard to her wound in general. She has been tolerating the dressing changes without complication. Fortunately I do not see any evidence of active infection locally or systemically at this  time which is great news. I am concerned a little bit however about not really her wounds but the second toe right foot where there appears to be some cellulitis after she dropped a frying pan on this about 2 weeks ago. She tells me its been itching and burning and I do think that it is possible she may have an infection based on what we are seeing currently. 03-20-2022 upon evaluation today patient actually appears to be doing much better at this point. Fortunately there does not appear to be any signs of active infection locally or systemically at this time which is great news. No fevers, chills, nausea, vomiting, or diarrhea. 03-27-2022 upon evaluation today patient appears to be doing somewhat poorly in regard to her legs compared to previous. Her right leg has a couple areas that are open and she is very warm and painful to touch around the lateral portion of the ankle area. Left leg is showing signs of little bit more drainage and weeping as well I think that this is probably due to the fact that she is swelling a lot more that she has been in the past. Fortunately I do not see any signs of active infection systemically though locally there is definitely some issues here. 04-03-2022 upon evaluation today patient appears to be doing poorly in regard to her wounds on the legs unfortunately. She also seems to have more significant cellulitis that has ensued. Unfortunately she has been on doxycycline initially it seemed to be helping but as of this week that is no longer the case.  It started getting worse last week and with the reinitiation of the doxycycline nothing has improved. Often when she gets like this IV antibiotics are necessary. 04-19-2022 upon evaluation today patient appears to be doing well currently in regard to her legs from an infection standpoint. She did get IV vancomycin in the hospital and states that her legs did get better when she left she was no longer weeping but she had been in  the bed for 4 days straight. With that being said since coming home her legs have begun to weep again the original wound that we were dealing with has healed she does have some weeping over both lower extremities however which is still of concern. 05-03-2022 upon evaluation today patient appears to be doing well currently in regard to her left leg which I feel like is a little bit better unfortunately the right leg is still draining significantly. Fortunately I do not see any signs of infection locally or systemically at this time. No fevers, chills, nausea, vomiting, or diarrhea. 05-10-2022 unfortunately patient actually showed signs of increasing erythema today. I believe that the cellulitis never completely resolved from when she was in the hospital last. Again she has been on doxycycline previously by myself I gave her Keflex more recently as an ongoing prescription to try to prevent infection but again I do not think this is strong enough to be able to get this under control. I really feel like that she is probably can need IV antibiotics and a good period of time in the hospital to be able to keep her legs elevated and not having to get around to do a whole lot in order to allow this to resolve. The patient voiced understanding. 05-24-2022 upon evaluation today patient appears to be doing excellent in regard to her legs. She has been tolerating the dressing changes without complication. Fortunately there does not appear to be any signs of infection at this time which is great news she has been on cefadroxil since she was discharged from the hospital. The day that we sent her she actually tells me that she ended up in septic shock that day and coded. When she first showed up they did not notice it on the lab work but said by the end of the day she had already undergone this and they had had to address the issue obviously when she coded. She tells me that likely she would not be here today if she  had not gone to the hospital that day. I explained to her that I do believe that was a God thing and it was just the perfect timing that he had for her at that time. 06-14-2022 upon evaluation today patient appears to be doing well currently in regard to her legs. In general I think she is making good progress and I do not see any signs of infection at this time which is great news. No fevers, chills, nausea, vomiting, or diarrhea. 06-28-2022 upon evaluation today patient appears to be doing excellent in regard to her legs. She is actually showing signs of excellent improvement I do not see any evidence of infection locally nor systemically at this time. No fevers, chills, nausea, vomiting, or diarrhea. 07-26-2022 upon eval evaluation today patient appears to be doing well currently in regard to her wound. She has been tolerating the dressing changes without complication. Fortunately I do not see any major issues here she has a couple areas of weeping mainly on the right leg the  left leg seems to be doing pretty well. 08-23-2021 upon evaluation today patient appears to be doing excellent in regard to her legs. These are showing signs of wonderful improvement I am actually very pleased with where we stand I think she is doing the best that I have ever seen her. Fortunately I do not see any evidence of infection locally or systemically which is great news. 09-27-2022 upon evaluation today patient appears to be doing well she has no open wounds at this time her lymphedema seems to be doing quite well at this point. Fortunately I do not see any signs of active infection locally nor systemically which is great news and overall her swelling though up a little bit is not nearly as bad as it has been at times. No evidence of cellulitis currently. 10-25-2022 upon evaluation patient appears to be doing excellent in regard to her wounds currently. I do not see anything open that this is great her leg still has the  bilateral lymphedema but again this is not nearly as severe as what it has been in the past I think she is doing really well. She is using the pumps when she SHAKILAH, LINNEBUR ([id number]) [id number] Page 13 of 14 can although she does tell me there is been some issue there with her arthritis at times not being able to do what she needs to do. 11-27-2022 upon evaluation today patient appears to be doing well currently in regard to her legs which do not show any signs of opening she still has significant lymphedema bilaterally. Fortunately I do not see any evidence of active infection locally nor systemically which is great news. Objective Constitutional Obese and well-hydrated in no acute distress. Vitals Time Taken: 10:44 AM, Height: 63 in, Weight: 372 lbs, BMI: 65.9, Temperature: 97.8 F, Pulse: 92 bpm, Respiratory Rate: 18 breaths/min, Blood Pressure: 118/80 mmHg. Respiratory normal breathing without difficulty. Psychiatric this patient is able to make decisions and demonstrates good insight into disease process. Alert and Oriented x 3. pleasant and cooperative. General Notes: Upon inspection patient's wounds again are completely healed she has nothing open at the moment which is great news. She does have bilateral significant primary lymphedema and she does have some new wraps that she got that she is going to be trying I do believe this is hopefully to be helpful for her as well. 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 Wash wounds with antibacterial soap and water. May shower; gently cleanse wound with  antibacterial soap, rinse and pat dry prior to dressing wounds No tub bath. Edema Control - Lymphedema / Segmental Compressive Device / Other: Elevate, Exercise Daily and Avoid Standing for Long Periods of Time. Elevate leg(s) parallel to the floor when sitting. DO YOUR BEST to sleep in the bed at night. DO NOT sleep in your recliner. Long hours of sitting in a recliner leads to swelling of the legs and/or potential wounds on your backside. Other: - use lymphedema pumps Non-Wound Condition: Cleanse affected area with antibacterial soap and water, Medications-Please add to medication list.: Topical Antibiotic - apply Mupirocin to weepy areas as needed 1. I am going to suggest that we have the patient continue to  monitor for any evidence of infection or worsening. Based on what I am seeing I do believe that she is making really good progress here and I am extremely pleased in that regard. 2. I am also can recommend that we have her continue specifically with the mupirocin ointment as needed although she does not use this all the time just when she has anything new or open that comes up. 3. She is using her new lymphedema wraps to give this a try and see how that does for the lower legs she likes that and she may get them from the upper part of her thighs as well. We will see patient back for reevaluation in 1 week here in the clinic. If anything worsens or changes patient will contact our office for additional recommendations. Electronic Signature(s) Signed: 11/27/2022 1:30:20 PM By: Ginger Organ, Ronda Fairly ([id number]) PM By: Allen Derry PA-C [id number] Page 14 of 14 Signed: 11/27/2022 1:30:20 Entered By: Allen Derry on 11/27/2022 13:30:20 -------------------------------------------------------------------------------- SuperBill Details Patient Name: Date of Service: CHAI, COTRONE 11/27/2022 Medical Record Number: [id number] Patient Account Number:  [id number] Date of Birth/Sex: Treating RN: [birth year] (67 y.o. [name] Primary Care Provider: [name] Crutch Other Clinician: Betha Loa Referring Provider: Treating Provider/Extender: Wynona Dove in Treatment: 69 Diagnosis Coding ICD-10 Codes Code Description Q82.0 Hereditary lymphedema L97.811 Non-pressure chronic ulcer of other part of right lower leg limited to breakdown of skin L97.821 Non-pressure chronic ulcer of other part of left lower leg limited to breakdown of skin Facility Procedures : CPT4 Code: [id number] Description: [phone] - WOUND CARE VISIT-LEV 2 EST PT Modifier: Quantity: 1 Physician Procedures : CPT4 Code Description Modifier [id number] - WC PHYS LEVEL 3 - EST PT ICD-10 Diagnosis Description Q82.0 Hereditary lymphedema L97.811 Non-pressure chronic ulcer of other part of right lower leg limited to breakdown of skin L97.821 Non-pressure  chronic ulcer of other part of left lower leg limited to breakdown of skin Quantity: 1 Electronic Signature(s) Signed: 11/27/2022 1:33:35 PM By: Allen Derry PA-C Entered By: Allen Derry on 11/27/2022 13:33:35

## 2022-11-27 NOTE — Progress Notes (Signed)
PCP: [facility] Primary Cardiologist: Marcina Millard, MD (last seen Nov 25, 2022)  HPI:  [name] is a 67 y/o female with a history of asthma, HTN, arthritis, lymphedema, sleep apnea, spinal stenosis, obesity, previous tobacco use and chronic heart failure.   Echo 11/01/21: EF of 60-65%.   Admitted 05/10/22 due to worsening edema and drainage. Patient evaluated and found to have cellulitis of the right lower extremity. Had AKI. Vascular and wound consults obtained. Antibiotics started. Became symptomatic with elevated lactic acid and WBC. IVF given and antibiotic changed. CT scan 11/7 did not show any drainable fluid collection. Discharged after 6 days.   She presents today for a HF follow-up visit with a chief complaint of minimal SOB with moderate exertion. Chronic in nature. Did have a worsened episode of SOB a few days ago but she has missed some doses of bumex over the last couple of weeks due to bilateral hip pain making it difficult to get to the bathroom. Generally takes her bumex QOD. Has associated palpitations, trouble sleeping & fatigue along with this. No chest pain  Older brother died suddenly Nov 25, 2022 and she admits that she's not been eating very healthy due to the stress of this. She says that she's an emotional eater and tends to grab for breads & sweets. She's been eating 2 egg sandwiches on [place] toast for breakfast lately. She's unable to weigh at home because she can't get both her feet on a scale due to her lymphedema but knows that she's gained weight because of her lymphedema.   ROS: All systems negative except as listed in HPI, PMH and Problem List.  SH:  Social History   Socioeconomic History   Marital status: Single    Spouse name: Not on file   Number of children: Not on file   Years of education: Not on file   Highest education level: Not on file  Occupational History   Not on file  Tobacco Use   Smoking status: Former    Packs/day: .25    Types: Cigarettes     Start date: 07/09/1976    Quit date: 07/09/1990    Years since quitting: 32.4   Smokeless tobacco: Current    Types: Snuff  Vaping Use   Vaping Use: Never used  Substance and Sexual Activity   Alcohol use: No   Drug use: No   Sexual activity: Not Currently  Other Topics Concern   Not on file  Social History Narrative   Not on file   Social Determinants of Health   Financial Resource Strain: Not on file  Food Insecurity: No Food Insecurity (05/10/2022)   Hunger Vital Sign    Worried About Running Out of Food in the Last Year: Never true    Ran Out of Food in the Last Year: Never true  Transportation Needs: No Transportation Needs (05/10/2022)   PRAPARE - Administrator, Civil Service (Medical): No    Lack of Transportation (Non-Medical): No  Physical Activity: Not on file  Stress: Not on file  Social Connections: Not on file  Intimate Partner Violence: Not At Risk (05/10/2022)   Humiliation, Afraid, Rape, and Kick questionnaire    Fear of Current or Ex-Partner: No    Emotionally Abused: No    Physically Abused: No    Sexually Abused: No    FH:  Family History  Problem Relation Age of Onset   Breast cancer Maternal Aunt    Thyroid disease Other  Past Medical History:  Diagnosis Date   Arthritis    Asthma    CHF (congestive heart failure) (HCC)    Enlarged heart    Hypertension    Lymphedema    Sleep apnea    Spinal stenosis     Current Outpatient Medications  Medication Sig Dispense Refill   acetaminophen (TYLENOL) 500 MG tablet Take 500-1,000 mg by mouth every 6 (six) hours as needed for mild pain or fever.     albuterol (VENTOLIN HFA) 108 (90 Base) MCG/ACT inhaler Inhale 1-2 puffs into the lungs every 6 (six) hours as needed for wheezing or shortness of breath. (Patient not taking: Reported on 08/31/2022)     bumetanide (BUMEX) 2 MG tablet Take 1 tablet (2 mg total) by mouth daily. 30 tablet 5   cetirizine (ZYRTEC) 10 MG tablet Take 1 tablet (10  mg total) by mouth daily. (Patient taking differently: Take 10 mg by mouth daily as needed for allergies.) 60 tablet 0   dapagliflozin propanediol (FARXIGA) 10 MG TABS tablet Take 1 tablet (10 mg total) by mouth daily before breakfast. 30 tablet 5   metolazone (ZAROXOLYN) 5 MG tablet Take 0.5 tablets (2.5 mg total) by mouth 2 (two) times a week. (Patient taking differently: Take 2.5 mg by mouth 2 (two) times a week. Tues and sat) 12 tablet 3   mupirocin ointment (BACTROBAN) 2 % Apply 1 Application topically daily as needed (wound care).     NYSTATIN powder Apply 1 Application topically 2 (two) times daily as needed (Yeast Skin infection).     potassium chloride SA (KLOR-CON M) 20 MEQ tablet Take 2 tablets (40 mEq total) by mouth daily. 180 tablet 3   senna-docusate (SENOKOT-S) 8.6-50 MG tablet Take 1 tablet by mouth 2 (two) times daily as needed for mild constipation or moderate constipation.     spironolactone (ALDACTONE) 25 MG tablet TAKE 0.5 TABLET BY MOUTH ONCE DAILY. 15 tablet 6   No current facility-administered medications for this visit.    Vitals:   11/27/22 0848  BP: 110/86  Pulse: 96  SpO2: 98%  Weight: (!) 361 lb (163.7 kg)  Height: 5' 3.5" (1.613 m)   Lab Results  Component Value Date   CREATININE 1.81 (H) 08/31/2022   CREATININE 1.28 (H) 08/03/2022   CREATININE 1.32 (H) 07/23/2022    PHYSICAL EXAM:  General:  Well appearing. No resp difficulty HEENT: normal Neck: supple. JVP flat. No lymphadenopathy or thryomegaly appreciated. Cor: PMI normal. Regular rate & rhythm. No rubs, gallops or murmurs. Lungs: clear Abdomen: soft, nontender, nondistended. No hepatosplenomegaly. No bruits or masses.  Extremities: no cyanosis, clubbing, rash. + bilateral lymphedema Neuro: alert & oriented x3, cranial nerves grossly intact. Moves all 4 extremities w/o difficulty. Affect pleasant.   ECG: today shows NSR with LVH; unchanged from previous one 11/23   ASSESSMENT & PLAN:  1:  Chronic heart failure with preserved ejection fraction without structural changes- - likely HTN/ sleep apnea - NYHA class II - euvolemic today - has attempted  to weigh daily with standing on 2 scales at home but it fluctuates too much - weight up 5 pounds from last visit here 2 months ago - echo 11/01/21: EF of 60-65%.  - admits to eating more breads/ sweets because of the emotional stress of her brother's sudden death - reviewed the importance of keeping her sodium 2000mg  as well as keeping daily fluid intake to 60-64 ounces/ day - facial/mouth swelling with ACEi - continue farxiga 10mg   -  continue spironolactone 12.5mg  QD - continue bumex 2mg  QOD - continue metolazone 2.5mg  twice weekly - continue potassium daily - saw cardiology [name]) 04/24 - BNP 10/31/21 was 123.9 - PharmD reconciled medications with the patient  2: HTN with stage 3 CKD- - BP 110/86 - sees PCP [name]) @ [facility] [place]  - saw nephrology [name]) 09/06/22  - BMP 09/06/22 reviewed and showed sodium 139, potassium 3.7, creatinine 1.62 and GFR 35 - check BMP next visit  3: Lymphedema- - saw wound center 04/24 - returns later today  4: Sleep apnea- - wearing CPAP nightly - saw pulmonology [name]) 04/24  5: Snuff use- - says that she's been using snuff for a long time - discussed health coaching but she declines at this time  Return in 3 months, sooner if needed.

## 2022-11-27 NOTE — Progress Notes (Signed)
[facility] FAIL[facility]IN[facility] - Pharmacist Note  [name] is a 67 y.o. female with HFpEF (EF >50%) presenting to the Heart Failure Clinic for follow up. Patient reports doing well on current regimen. She is unable to use home scale because she is unable to get both feet on her scale. She reports that her brother died in early Nov 05, 2022 and since then she has been eating more sweets and breads lately. She wants to get back down to ~335 lbs and states that she wants to make changes to her diet and lifestyle changes to accomplish this. She reports increased leg swelling lately and an episode of shortness of breath on Saturday. She reports that her activity is limited largely by joint pain which has been exacerbated by the rainy weather lately. She reports that this is improving lately, though she is still sleeping in the recliner. She states that she is afraid of getting stuck in the bed and that the recliner is easier for her to get out of. She reports some difficulty with sleeping. We discussed sleep hygiene and strategies to help improve sleep schedule.  Recent ED Visit (past 6 months): none  Guideline-Directed Medical Therapy/Evidence Based Medicine ACE/ARB/ARNI:  none Beta Blocker:  none Aldosterone Antagonist: Spironolactone 12.5 mg daily Diuretic: Bumetanide 2 mg daily  + metolazone 2.5 mg twice weekly on Tuesdays and Saturdays SGLT2i: Dapagliflozin 10 mg daily  Adherence Assessment Do you ever forget to take your medication? [] Yes [x] No  Do you ever skip doses due to side effects? [] Yes [x] No  Do you have trouble affording your medicines? [] Yes [x] No  Are you ever unable to pick up your medication due to transportation difficulties? [] Yes [x] No  Do you ever stop taking your medications because you don't believe they are helping? [] Yes [x] No  Do you check your weight daily? [] Yes [x] No  Adherence strategy: none reported Barriers to obtaining medications: none  reported  Diagnostics ECHO: Date 11/04/2021, EF 60-65%, no RWMA  Vitals    11/27/2022    8:48 AM 10/08/2022    1:33 PM 09/27/2022    8:44 AM  Vitals with BMI  Height 5' 3.5" 5\' 3"    Weight 361 lbs 345 lbs 3 oz 356 lbs 8 oz  BMI 62.94 61.16   Systolic 110 120 161  Diastolic 86 90 85  Pulse 96 107 95     Recent Labs    Latest Ref Rng & Units 08/31/2022   11:14 AM 08/03/2022   10:19 AM 07/23/2022   11:55 AM  BMP  Glucose 70 - 99 mg/dL 93  98  89   BUN 8 - 23 mg/dL 47  35  27   Creatinine 0.44 - 1.00 mg/dL 0.96  0.45  4.09   Sodium 135 - 145 mmol/L 136  137  137   Potassium 3.5 - 5.1 mmol/L 3.5  3.8  3.7   Chloride 98 - 111 mmol/L 97  99  101   CO2 22 - 32 mmol/L 27  27  25    Calcium 8.9 - 10.3 mg/dL 9.5  9.4  8.4     Past Medical History Past Medical History:  Diagnosis Date   Arthritis    Asthma    CHF (congestive heart failure) (HCC)    Enlarged heart    Hypertension    Lymphedema    Sleep apnea    Spinal stenosis     Plan Continue regimen as directed by NP Consider titration of spironolactone as BP allows Annual  echo due 10/2022  Time spent: 15 minutes  Celene Squibb, PharmD PGY1 Pharmacy Resident 11/27/2022 9:25 AM

## 2022-12-25 ENCOUNTER — Encounter: Payer: Medicare Other | Attending: Physician Assistant | Admitting: Physician Assistant

## 2022-12-25 DIAGNOSIS — L97811 Non-pressure chronic ulcer of other part of right lower leg limited to breakdown of skin: Secondary | ICD-10-CM | POA: Diagnosis not present

## 2022-12-25 DIAGNOSIS — Q820 Hereditary lymphedema: Secondary | ICD-10-CM | POA: Insufficient documentation

## 2022-12-25 DIAGNOSIS — L97821 Non-pressure chronic ulcer of other part of left lower leg limited to breakdown of skin: Secondary | ICD-10-CM | POA: Insufficient documentation

## 2022-12-25 DIAGNOSIS — I110 Hypertensive heart disease with heart failure: Secondary | ICD-10-CM | POA: Diagnosis not present

## 2022-12-25 DIAGNOSIS — M1990 Unspecified osteoarthritis, unspecified site: Secondary | ICD-10-CM | POA: Diagnosis not present

## 2022-12-25 DIAGNOSIS — L03116 Cellulitis of left lower limb: Secondary | ICD-10-CM | POA: Insufficient documentation

## 2022-12-25 DIAGNOSIS — K219 Gastro-esophageal reflux disease without esophagitis: Secondary | ICD-10-CM | POA: Insufficient documentation

## 2022-12-25 DIAGNOSIS — L03115 Cellulitis of right lower limb: Secondary | ICD-10-CM | POA: Diagnosis not present

## 2022-12-25 DIAGNOSIS — I509 Heart failure, unspecified: Secondary | ICD-10-CM | POA: Diagnosis not present

## 2022-12-25 DIAGNOSIS — G4733 Obstructive sleep apnea (adult) (pediatric): Secondary | ICD-10-CM | POA: Diagnosis not present

## 2022-12-25 DIAGNOSIS — I890 Lymphedema, not elsewhere classified: Secondary | ICD-10-CM | POA: Diagnosis present

## 2022-12-25 NOTE — Progress Notes (Addendum)
[name], [name] ([id number]) [id number] Page 1 of 14 Visit Report for 12/25/2022 Chief Complaint Document Details Patient Name: Date of Service: [name], [name] 12/25/2022 10:45 A [name] Medical Record Number: [id number] Patient Account Number: [id number] Date of Birth/Sex: Treating RN: 04-01-1956 (67 y.o. [name] Primary Care Provider: [name] Crutch Other Clinician: Betha Loa Referring Provider: Treating Provider/Extender: Wynona Dove in Treatment: 48 Information Obtained from: Patient Chief Complaint Bilateral LE lymphedema with Left LE Ulcers Electronic Signature(s) Signed: 12/25/2022 11:14:51 AM By: Allen Derry PA-C Entered By: Allen Derry on 12/25/2022 11:14:50 -------------------------------------------------------------------------------- HPI Details Patient Name: Date of Service: [name], [name] 12/25/2022 10:45 A [name] Medical Record Number: [id number] Patient Account Number: [id number] Date of Birth/Sex: Treating RN: January 08, 1956 (67 y.o. [name] Primary Care Provider: [name] Crutch Other Clinician: Betha Loa Referring Provider: Treating Provider/Extender: Wynona Dove in Treatment: 54 History of Present Illness HPI Description: The patient is a 67 year old female with history of hypertension and a long-standing history of bilateral lower extremity lymphedema (first presented on 4/2) . She has had open ulcers in the past which have always responded to compression therapy. She had briefly been to a lymphedema clinic in the past which helped her at the time. this time around she stopped treatment of her lymphedema pumps approximately 2 weeks ago because of some pain in the knees and then noticed the right leg getting worse. She was seen by her PCP who put her on clindamycin 4 times a day 2 days ago. The patient has seen [facility] and Dr. Gilda Crease had seen her last year where a vascular study  including venous and arterial duplex studies were within normal limits. he had recommended compression stockings and lymphedema pumps and the patient has been using this in about 2 weeks ago. She is known to be diabetic but in the past few time she's gone to her primary care doctor her hemoglobin A1c has been normal. 02/11/2015 - after her last visit she took my advice and went to the ER regarding the progressive cellulitis of her right lower extremity and she was admitted between July 17 and 22nd. She received IV antibiotics and then was sent home on a course of steroid-induced and oral antibiotics. She has improved much since then. 02/17/2015 -- she has been doing fine and the weeping of her legs has remarkably gone down. She has no fresh issues. [name], [name] ([id number]) [id number] Page 2 of 14 01/15/18 This patient was given this clinic before most recently in 2016 seen by Dr. Meyer Russel. She has massive bilateral lymphedema and over the last 2 months this had weeping edema out of the left leg. She has compression pumps but her compliance with these has been minimal. She has [facility] they've been using TCA/ABDs/kerlix under an Ace wrap.she has had recent problems with cellulitis. She was apparently seen in the ER and 12/23/17 and given clindamycin. She was then followed by her primary doctor and given doxycycline and Keflex. The pain seems to have settled down. In April 2018 the patient had arterial studies done at [facility]. This showed triphasic waveforms throughout the right leg and mostly triphasic waveforms on the left except for monophasic at the posterior tibial artery distally. She was not felt to have evidence of right lower extremity arterial stenosis or significant problems on the left side. 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 She also had a right lower extremity  venous Doppler in January  2018 this was limited by the patient's body habitus and lymphedema. Most of the proximal veins were not visualized The patient presents with an area of denuded skin on the anterior medial part of the left calf. There is weeping edema fluid here. 01/22/18; the patient has somewhat better edema control using her compression pumps twice a day and as a result she has much better epithelialization on the left anterior calf area. Only a small open area remains. 01/29/18; the patient has been compliant with her compression pumps. Both the areas on her calf that healed. The remaining area on the left anterior leg is fully epithelialized Readmission: 02/20/2019 upon evaluation today patient presents for reevaluation due to issues that she is having with the bilateral lower extremities. She actually has wounds open on both legs. On the right she has an area in the crease of her leg on the right around the knee region which is actually draining quite a bit and actually has some fungal type appearance to it. She has been on nystatin powder that seems to have helped to some degree. In regard to the left lower extremity this is actually in the lower portion of her leg closer to the ankle and again is continuing to drain as well unfortunately. There does not appear to be any signs of active infection at this time which is good news. No fevers, chills, nausea, vomiting, or diarrhea. She tells me that since she was seen last year she is actually been doing quite well for the most part with regard to her lower extremities. Unfortunately she now is experiencing a little bit more drainage at this time. She is concerned about getting this under control so that it does not get significantly worse. 02/27/2019 on evaluation today patient appears to be doing somewhat better in regard to her bilateral lower extremity wounds. She has been tolerating the dressing changes without complication. Fortunately there is no signs of active  infection at this point. No fevers, chills, nausea, vomiting, or diarrhea. She did get her dressing supplies which is excellent news she was extremely excited to get these. She also got paperwork from [employer] for their financial assistance program where they may be able to help her out in the future if needed with supplies at discounted prices. 03/06/2019 on evaluation today patient appears to be doing a little worse with regard to both areas of weeping on her bilateral lower extremities. This is around the right medial knee and just above the left ankle. With that being said she is unfortunately not doing as well as I would like to see. I feel like she may need to potentially go see someone at the lymphedema clinic as the wraps that she needs or even beyond what we can do here at the wound care center. She really does not have wounds she just has open areas of weeping that are causing some difficulty for her. Subsequently because of this and the moisture I am concerned about the potential for infection I am going to likely give her a prophylactic antibiotic today, Keflex, just to be on the safe side. Nonetheless again there is no obvious signs of active infection at this time. 03/13/2019 on evaluation today patient appears to be doing well with regard to her bilateral lower extremities where she has been weeping compared to even last week's evaluation. I see some areas of new skin growth which is excellent and overall I am very pleased with how things  seem to be progressing. No fevers, chills, nausea, vomiting, or diarrhea. 03/20/2019 on evaluation today patient unfortunately is continuing to have issues with significant edema of the left lower extremity. Her right side seems to be doing much better. Unfortunately her left side is showing increased weeping of the lower portion of her leg. This is quite unfortunate obviously we were hoping to get her into the lymphedema clinic they really do not seem to when  I see her how if she is draining. Despite the fact this is really not wound related but more lymphedema weeping related. Nonetheless I do not know that this can be helpful for her to even go for that appointment since again I am not sure there is much that they would actually do at this point. We may need to try a 4 layer compression wrap as best we can on her leg. She is on the Augmentin currently although I am still concerned about whether or not there could be potentially something going on infection wise I would obtain a culture though I understand is not the best being that is a surface culture I just 1 to make sure I do not seem to be missing anything. 03/27/2019 on evaluation today patient appears to be doing much better in regard to the left lower extremity compared to last week. Last week she had tremendous weeping which I think was subsequent to infection now she seems to be doing much better and very pleased. This is not completely healed but there is a lot of new skin growth and it has dried out quite a bit. Overall I think that we are doing well with how things are moving along at this time. No fevers, chills, nausea, vomiting, or diarrhea. 04/03/2019 evaluation today patient appears to be doing a little worse this week compared to last time I saw her. I think this may be due to the fact that she is having issues with not being able to sleep in her bed at least not until last night. She is therefore been in a lift chair and subsequently has also had issues with not been able to use her pumps since she could not get in bed. With that being said the patient overall seems to be doing okay I do think I may want extend the antibiotic for a little bit longer at least until we can see if her edema and her weeping gets better and if it is then obviously I can always discontinue the antibiotics as of next week however I want her to continue to have it over the next week. 04/10/2019 on evaluation today  patient unfortunately is still doing poorly with regard to her left lower extremity. Her right is all things considering doing fairly well. On the left however she continues to have spreading of the area of infection and weeping which appears to be even a larger surface area than noted last week. She did have a positive culture for Pseudomonas in particular which seems to have been of concern she still has green/yellow discharge consistent with Pseudomonas and subsequently a tremendous amount of it. This has me obviously still concerned about the infection not really clearing up despite the fact that on culture it appears the Cipro should have been a good option for treating this. I think she may at this point need IV antibiotics since things are not doing better I do not want to get worse and cause sepsis. She is in agreement with the plan and  believes as well that she likely does need to go to the hospital for IV vancomycin. Or something of the like depending on what the recommendation is from the ER. 04/17/2019 on evaluation today patient appears to be doing excellent in regard to her lower extremity on the left. She was in the hospital for several days from when I sent her last we saw her until just this past Tuesday. Fortunately her drainage is significantly improved and in fact is mostly clear. There is just a couple small areas that may still drain a little bit she states that the [facility] they prescribed for her at discharge she went picked up from pharmacy and got home but has not been able to find it since. She is looked everywhere. She is wondering if I will replace that for her today I will be more than happy to do that. 05/01/2019 on evaluation today patient actually appears to be doing quite well with regard to her lower extremities. She occasionally is having areas that will leak and then heal up mainly when a piece of the fibrotic skin pops off but fortunately she is not having any signs of  active infection at this time. Overall she also really does not have any obvious weeping at this time. I do believe however she really needs some compression wraps and I think this may be a good time to get her back to the lymphedema clinic. 05/11/2019 on evaluation today patient actually appears to be doing quite well with regard to her bilateral lower extremities. She occasionally will have a small area that we per another but in general seems to be completely healed which is great news. Overall very pleased with how everything seems to be progressing. She does have her appointment with lymphedema clinic on November 18. 05/25/2019 on evaluation today patient appears to be doing well with regard to her left lower extremity. I am very pleased in this regard. In regard to her right leg this actually did start draining more I think it is mainly due to the fact that her leg is more swollen. I am not seeing any obvious signs of infection at this time although that is definitely something were obviously acutely aware of simply due to the fact that she had an issue not too far back with exactly this issue. Nonetheless I do feel like that lymphedema clinic would still be beneficial for her. I explained obviously if they are not able to do anything treatment wise on the right leg we could at least have them treat her left leg and then proceed from there. The patient is really in agreement with that plan. If they are able to do both as the drainage slows down that I would be happy to let them handle both. [name], [name] ([id number]) [id number] Page 3 of 14 06/01/2019 on evaluation today patient unfortunately appears to be doing worse with regard to her right lower extremity. The left lower extremity is still maintaining at this point. Unfortunately she has been having significantly increased pain over the past several days and has been experiencing as well increased swelling of the  right lower extremity. I really do not know that I am seeing anything that appears to be obvious for infection at this point to be peripherally honest. With that being said the patient does seem to be having much more swelling that she is even experienced in the past and coupled with increased pain in her hip as well I am concerned that  again she could potentially have a DVT although I am not 100% sure of this. I think it something that may need to be checked out. We discussed the possibility of sending her for a DVT study through the hospital but unfortunately transportation is an issue if she does have a DVT I do not want her to wait days to be able to get in for that test however if she has this scheduled as an outpatient that is as fast that she will be able to get the test scheduled for transportation purposes. That will also fall on Thanksgiving so subsequently she did actually be looking at either Friday or even next week before we would know anything back from this. That is much too long in my opinion. Subsequent to the amount of discomfort she is experiencing the patient is actually okay with going to the ER for evaluation today. 06/12/2019 on evaluation today patient actually appears to be doing significantly better compared to last time I saw her. Following when I last saw her she was actually in the hospital from that Monday until the following Sunday almost 1 full week. She actually was placed on Keflex in the hospital following the time for her to be discharged and Dr. Steva Ready has recommended 2 times a day dosing of the Keflex for the next year in order to help with more prophylactic/preventative measures with regard to her developing cellulitis. Overall I think this sounds like an excellent plan. The patient unfortunately is good to have trouble being treated at lymphedema clinic due to the fact that she really cannot get up on the bed that they have there. They also state that  they cannot manage her as long as she has anything draining at this point. Obviously that is somewhat unfortunate as she does need help with edema control but nonetheless we will have to do what we can for her outside of it sounds like the lymphedema clinic scenario at this point. 06/19/2019 on evaluation today patient appears to be doing fairly well with regard to her bilateral lower extremities. She is not nearly as swollen and shows no signs of infection at this point. There is no evidence of cellulitis whatsoever. She also has no open wounds or draining at this point which is also good news. No fever chills noted. She seems to be in very good spirits and in fact appears to be doing quite well. READMISSION 11/27/2019 This is a 67 year old woman that we have had in this clinic several times before including 2015, 16 and 19 and then most recently from 03/20/2019 through 06/19/2019 with bilateral lower extremity lymphedema. She has had previous arterial and reflux studies done years ago which were not all that remarkable. 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 She does have a positive family history and she had large legs starting may be in her 45s. She was recently in hospital from 10/20/2019 through 10/28/2019 with right leg cellulitis. She was given Ancef and clindamycin and then Zosyn when a culture showed Pseudomonas. At that time there was purulent drainage. She was followed by infectious disease Dr Steva Ready. The patient is now back at home. She has noted increased swelling in the right and no drainage in her right leg mostly on the posterior medial aspect in the calf area. She has not had pain or fever. She has literally been improved lysing above dressings because her at the area of this is far too large  for standard compression. She has been wrapping the areas with sheets to resorptive pads. She is found these helped somewhat. She does  have an appointment with the lymphedema clinic in [place] in late June. Past medical history includes bilateral lymphedema, hypertension, obstructive sleep apnea with CPAP. Recent hospitalization with apparently Pseudomonas cellulitis of the right lower leg 12/15/2019 upon evaluation today patient appears to be doing a little bit worse in regard to her right lower extremity. Unfortunately she is having more weeping down in the lower portion of her leg. Fortunately there is no signs of active infection at this time. No fever chills noted. The patient states she is not having increased pain except for when she attempted to use the lymphedema pumps unfortunately she states that she did have pain when she did this. Otherwise we been using absorptive dressings of one type or another she is using diapers at home and then subsequently Ace wraps. In regard to the barrier cream we have discussed the possibility of derma cloud which she would like to try I do not have a problem with that. 12/22/2019 upon evaluation today patient actually appears to be doing better in regard to her leg ulcers at this point. Fortunately there does not appear to be any signs of active infection which is great news and I am extremely pleased with where things are progressing at this time. There is no sign of active infection currently. The patient is very pleased to see things doing so well. 12/29/2019 upon evaluation today patient appears to be doing a little bit better in regard to her weeping in general over her lower extremities. She does have some signs of mild erythema little bit more than what I noted last week or rather last visit. Nonetheless I think that my threshold for switching her antibiotics from Keflex to something else is very low at this point considering that she has had such severe infections in the past that seem to come almost out of nowhere. There is a little erythema and warmth noted of the lower portion of  her leg compared to the upper which also makes me want to go ahead and address things more rapidly at this point. Likely I would switch out the Keflex for something like Levaquin ideally. 7/16; patient with severe bilateral lymphedema. She has superficial wounds albeit almost circumferential now on the left lateral lower leg. This may be new from last time. Small area on the right anterior lower leg and then another area on the right medial lower leg and of pannus fold. She has been using various absorptive garments. She states she is using her compression pumps once a day occasionally twice. Culture from her last visit here was negative 01/29/2020 on evaluation today patient appears to be doing excellent at this point in regard to her legs with regard to infection I see no signs of active infection at this point. She still does have unfortunately areas of weeping this is minimal on the right now her left is actually significantly worse although I do not think it is as bad as last week with Dr. Dellia Nims saw her. She has been trying to pump and elevate her legs is much as possible. She has previously been on the Keflex and in the past for prevention that seems to do fairly well and likely can extend that today. 02/04/2020 on evaluation today patient appears to be doing better in regard to her legs bilaterally. Fortunately there is no signs of active infection  at this time which is great news and overall she has less weeping on the left compared to the right and there is several spots where she is pretty much sealed up with no draining regions. Overall very pleased in this regard. 02/19/2020 on evaluation today patient appears to be doing very well in regard to her wounds currently. Fortunately there is no evidence of active infection overall very pleased with where things stand. She is significantly improved in regard to her edema I am extremely pleased in this regard she tells me that the popping no longer  hurts and in fact she actually looks forward to it. 03/04/2020 on evaluation today patient appears to be doing excellent in regard to her lower extremities. Fortunately there is no signs of active infection at this time. No fevers, chills, nausea, vomiting, or diarrhea. 03/25/2020 on evaluation today patient appears to be doing a little bit more poorly in regard to her legs at this point. She tells me that she is still continue to have issues with drainage and this has been a little bit worse she was getting ready to start taking the Keflex again but wanted to see me first. Fortunately there is no signs of active infection at this time. No fevers, chills, nausea, vomiting, or diarrhea. 04/15/2020 upon evaluation today patient appears to be doing somewhat poorly in regard to her right leg. She tells me she has been having more pain she has been taking the Keflex that was previously prescribed unfortunately that just does not seem to help with this. She was hoping that the pain on her right was actually coming from the fact that she was having issues with her wrap having gotten caught in her recliner. With that being said she tells me that she knew something was not right. Currently her right leg is warm to touch along with being erythematous all the way up to around at least mid thigh as far as I can see. The left leg does not appear to be doing that badly though there is increased weeping around the ankle region. 05/06/2020 on evaluation today patient appears to be doing much better than last time I saw her. She did go to the hospital where she was admitted for 2 days and treated with antibiotic therapy. She was discharged with antibiotics as well and has done extremely well. I am extremely pleased with where things stand today. There is no signs of active infection at this time which is great news. 05/27/2020 upon evaluation today patient appears to be doing well with regard to her lower extremities  bilaterally. She has just a very tiny area on the right leg which is opening on the left leg she is significantly improved though she still has several areas that do appear to be open this is minimal compared to what is been in the past. In general I am extremely pleased with where things stand today. The patient does tell me she is not been using her pumps quite as much as [name], [name] ([id number]) [id number] Page 4 of 14 she should be. I do believe that is 1 area she can definitely work on. She has had a lot going on including a Covid exposure and apparently also a outbreak of likely shingles. 06/24/2020 upon evaluation today patient appears to be doing well with regard to her legs in general although the left leg unfortunately is showing some signs of erythema she does have a little bit of increased weeping and to be  honest I am concerned about infection here. I discussed that with her today and I think that we may need to address this sooner rather than later she has been taking Keflex she is not really certain that is been making a big improvement however. No fevers, chills, nausea, vomiting, or diarrhea. 06/30/2020 upon evaluation today patient's legs actually seem to be doing better in my opinion as compared to where they were last week. Fortunately there does not appear to be any signs of active infection. Her culture showed multiple organisms nothing predominate. With that being said the Levaquin seems to have done well I think she has improved since I last saw her as well. 07/14/2020 upon evaluation today patient appears to be doing actually better in regard to her lower extremities in my opinion. She has been tolerating the dressing changes without complication. Fortunately there is no signs of active infection at this time. 08/04/2020 upon evaluation today patient appears to be doing excellent in regard to her leg ulcers. Fortunately she has very little that is  open at this point. This is great news. In fact I think that the Goldbond medicated powder has been excellent for her. It seems to have done the trick where we had tried several other things without as much success. Fortunately there is no evidence of active infection at this time. No fevers, chills, nausea, vomiting, or diarrhea. 09/01/2020 upon evaluation today patient appears to be doing a little bit more poorly than the last time I saw her. She tells me right now that she has been having a lot of drainage compared to where things were previous which has unfortunately led to more irritation as well. She is concerned that this is leading to infection. Again based on what I am seeing today as well I am also concerned of the same to be honest. 09/15/2020 upon evaluation today patient appears to be doing well with regard to her legs compared to what I saw previous. Fortunately there does not appear to be any evidence of active infection at this time which is great news. At least not as badly as what it was previous. With that being said I do believe that the patient does need to have an extension of the antibiotics. This I believe will actually help her more in the way of making sure this stays under control and does not worsen. That was the Bactrim DS. Readmission: 08/01/2021 upon evaluation today patient appears to be doing actually pretty well all things considered. Is actually been almost a year since have seen her last March. Fortunately I do not see any evidence of active infection locally nor systemically at this point. She does have a couple open areas on the left leg the right leg appears to be doing quite well. In general she has been in the hospital 3 times since I saw her a year ago all 4 cellulitis type issues except for the last 1 which was actually more related to congestive heart failure for that reason she is not using her lymphedema pumps at this point and that is probably the  best idea. 08/08/2021 upon evaluation today patient appears to be doing well with regard to her legs all things considered her left leg may be a little bit more swollen based on what I see today. Fortunately I do not see any signs of active infection locally nor systemically at this time which is great news. No fevers, chills, nausea, vomiting, or diarrhea. 08/15/2021 upon evaluation today  patient actually appears to be doing excellent in regard to her wounds. She has been tolerating the dressing changes without complication. Fortunately I see no evidence of infection currently which is great news. No fevers, chills, nausea, vomiting, or diarrhea. 08/29/2021 upon evaluation patient appears to be doing excellent she in fact is almost completely healed. I am actually very pleased with where we stand today and I think that she is making wonderful progress she just has a small area of weeping on the right lateral leg everything else is pretty much completely healed which is also. 09/05/2021 upon evaluation today patient appears to be doing well with regard to her legs. She does have a small area of weeping on the right leg and there is a small area on the left leg as well. It is potentially something that may need to be addressed in the future more specifically but right now I think that there may have just been a little drainage here on the left. With all that being said I think that this still does not appear to be infected and overall I feel like she is doing quite well. That something we always have to keep a very close eye on as she can change very rapidly from okay to not okay. 09/12/2021 upon evaluation today patient appears to be doing a little bit worse in regard to her leg and actually somewhat concerned about the possibility of infection here. I discussed that with the patient. For that reason I am going to go ahead and see about getting her set up for a repeat prescription for the Bactrim. She is in  agreement with that plan. 3/14; patient presents for follow-up. She has been using silver alginate with dressing changes. She is still taking Bactrim prescribed at last clinic visit. She has no issues or complaints today. She has lymphedema pumps however does not use these. 09/26/2020 upon evaluation today patient appears to actually be doing well in regard to her right leg I am pleased in that regard. Unfortunately she has new areas on her left leg that are open at this point that I do need to be addressed. I am also concerned about the warmth around one of the wounds on the more anterior side. I think this is something that we may need to culture today potentially requiring antibiotics going forward. 10/03/2021 upon evaluation today patient unfortunately is not doing nearly as well as she was even last week with her wounds. She is having some issues here with increased cellulitis of the left lower extremity unfortunately. I did prescribe Augmentin for her eczema prescribed last week unfortunately she never actually received this prescription. The pharmacy stated they never got it. We double checked with them today they still did not receive it. I am not sure what happened in that regard. With that being said the patient is in good spirits today she does not appear to be as sick as where she normally is when the cellulitis gets significantly worse as it has in the past. Nonetheless the leg is much more red not just around the ankle where I was seeing at last week on Tuesday but rather now this is going all the way up to almost her knee and is definitely hot to touch compared to the right leg. Nonetheless I feel like she does need to go to the ER for likely IV antibiotics. 10-10-2021 upon evaluation today patient appears to be doing well with regard to her wound. She has been  tolerating the dressing changes that appears to be doing much better. After I saw her last week she did go to the ER they admitted  her to the hospital and gave her IV antibiotics she fortunately is doing significantly better. Overall I am extremely pleased with where things stand today. 10-17-2021 upon evaluation today patient appears to be doing well with regard to her wound. In fact this is looking better and her leg is looking better but she still does have some areas here of erythema. We will continue to address this as soon as possible in my opinion. I do believe she may require some debridement and what appears to be a more defined wound on her left leg at this point. 10-24-2021 upon evaluation patient is definitely showing signs of improvement which is great news. I do not see any evidence of active infection locally or systemically which is great news as well. No fevers, chills, nausea, vomiting, or diarrhea. 10-31-2021. Upon evaluation today patient's leg actually feels better from an infection and wound care perspective. I am actually very pleased in this regard. Unfortunately the biggest issue that I see at this time is that the patient is having a significant issue with her breathing. I did put her on the pulse ox machine to check her oxygen saturation and it was pretty much at 100% which is good but she tells me that when she is walking this is much more difficult for her. She also tells me that when she is trying to bend over to perform her dressing changes she is so short of breath due to the swelling and fluid in her abdominal area that she is just not been able to do this. She is tearful and very concerned today to be honest. I am truly sorry to see her this way I know she is really having a hard time at the moment. 11-14-2021 upon evaluation patient actually appears to be doing significantly better compared to when I last saw her. She was actually admitted to the hospital I believe this for 6 days total after I sent her on 10-31-2021. Subsequently they actually pulled off greater than 50 pounds of fluid she tells me  she feels significantly better her legs are not as tight she actually has some play in the tissue and it is obvious that she is doing much better just from a mobility EKNOOR, DIAL ([id number]) [id number] Page 5 of 14 standpoint as well as a breathing standpoint. Overall I am extremely happy for her and how she is doing the leg also looks much better on the left. 11-21-2021 upon evaluation today patient appears to be doing well although it does appear that she is going require some sharp debridement the wound is very dry this is the opposite of what its been she was having so much weeping that it was staying extremely wet. Nonetheless we do need to see about going ahead and debriding the wound today. 11-28-2021 upon evaluation today patient appears to be doing well with regard to her wound I definitely see signs of things continuing to improve which is great news. I do not see any evidence of active infection locally or systemically also great news. 12-05-2021 upon evaluation today patient appears to be doing well with regard to her wound. She has been tolerating the dressing changes. Fortunately there does not appear to be any signs of active infection locally or systemically at this time. No fevers, chills, nausea, vomiting, or diarrhea. 12-12-2021 upon evaluation  patient appears to be doing well with regard to her wound this is actually measuring smaller and looking much better. Fortunately I do not see any signs of active infection locally or systemically at this time which is excellent news. 6/13; small wound area in the folds of her lymphedema just above the left ankle. Fortunately the area looks quite good. She has been using silver alginate ABDs and an Ace wrap which she is able to change her self. She is not using her compression pumps out of fear that this could contribute to heart failure. 12-26-2021 upon evaluation patient's wounds are actually showing signs of  excellent improvement. I am very pleased with where things stand. Overall I think that she has been making excellent progress and I think we are very close to complete resolution which is great news. 01-11-2022 upon evaluation today patient appears to be doing well currently in regard to her legs in general although on the left leg she does have 1 area that still irritated and inflamed on the anterior portion of her leg. Fortunately I do not see any signs of systemic infection locally there might be some infection going on here however which is my biggest concern. Fortunately I do not see any evidence though of this spreading to any other location. 01-16-2022 upon evaluation today patient has actually showing signs of excellent improvement. Fortunately I do not see any evidence of infection locally or systemically which is great news and overall I am very pleased with where we stand at this point. 01-25-2022 upon evaluation today patient actually appears to be doing decently well in regard to her legs. She has been tolerating the dressing changes without complication. Fortunately there does not appear to be any evidence of active infection locally or systemically which is great news and overall I am extremely pleased with where we stand at this point. 02-13-2022 upon evaluation today patient appears to be doing well currently in regard to her wound which is actually showing signs of significant improvement. Fortunately I do not see any evidence of active infection locally or systemically at this time. No fevers, chills, nausea, vomiting, or diarrhea. 02-20-2022 upon evaluation today patient appears to be doing well currently in regard to her wound. She has been tolerating the dressing changes without complication. Fortunately there does not appear to be any signs of active infection locally or systemically at this time. No fevers, chills, nausea, vomiting, or diarrhea. 02-27-2022 upon evaluation today patient  appears to be doing excellent in regard to her wounds on the leg. She has been tolerating the dressing changes without complication this is very close to complete resolution. Fortunately I do not see any signs of infection locally or systemically at this time which is great news. No fevers, chills, nausea, vomiting, or diarrhea. 03-06-2022 upon evaluation today patient appears to be doing well currently in regard to her wound in general. She has been tolerating the dressing changes without complication. Fortunately I do not see any evidence of active infection locally or systemically at this time which is great news. I am concerned a little bit however about not really her wounds but the second toe right foot where there appears to be some cellulitis after she dropped a frying pan on this about 2 weeks ago. She tells me its been itching and burning and I do think that it is possible she may have an infection based on what we are seeing currently. 03-20-2022 upon evaluation today patient actually appears to be  doing much better at this point. Fortunately there does not appear to be any signs of active infection locally or systemically at this time which is great news. No fevers, chills, nausea, vomiting, or diarrhea. 03-27-2022 upon evaluation today patient appears to be doing somewhat poorly in regard to her legs compared to previous. Her right leg has a couple areas that are open and she is very warm and painful to touch around the lateral portion of the ankle area. Left leg is showing signs of little bit more drainage and weeping as well I think that this is probably due to the fact that she is swelling a lot more that she has been in the past. Fortunately I do not see any signs of active infection systemically though locally there is definitely some issues here. 04-03-2022 upon evaluation today patient appears to be doing poorly in regard to her wounds on the legs unfortunately. She also seems to have  more significant cellulitis that has ensued. Unfortunately she has been on doxycycline initially it seemed to be helping but as of this week that is no longer the case. It started getting worse last week and with the reinitiation of the doxycycline nothing has improved. Often when she gets like this IV antibiotics are necessary. 04-19-2022 upon evaluation today patient appears to be doing well currently in regard to her legs from an infection standpoint. She did get IV vancomycin in the hospital and states that her legs did get better when she left she was no longer weeping but she had been in the bed for 4 days straight. With that being said since coming home her legs have begun to weep again the original wound that we were dealing with has healed she does have some weeping over both lower extremities however which is still of concern. 05-03-2022 upon evaluation today patient appears to be doing well currently in regard to her left leg which I feel like is a little bit better unfortunately the right leg is still draining significantly. Fortunately I do not see any signs of infection locally or systemically at this time. No fevers, chills, nausea, vomiting, or diarrhea. 05-10-2022 unfortunately patient actually showed signs of increasing erythema today. I believe that the cellulitis never completely resolved from when she was in the hospital last. Again she has been on doxycycline previously by myself I gave her Keflex more recently as an ongoing prescription to try to prevent infection but again I do not think this is strong enough to be able to get this under control. I really feel like that she is probably can need IV antibiotics and a good period of time in the hospital to be able to keep her legs elevated and not having to get around to do a whole lot in order to allow this to resolve. The patient voiced understanding. 05-24-2022 upon evaluation today patient appears to be doing excellent in  regard to her legs. She has been tolerating the dressing changes without complication. Fortunately there does not appear to be any signs of infection at this time which is great news she has been on cefadroxil since she was discharged from the hospital. The day that we sent her she actually tells me that she ended up in septic shock that day and coded. When she first showed up they did not notice it on the lab work but said by the end of the day she had already undergone this and they had had to address the issue obviously when  she coded. She tells me that likely she would not be here today if she had not gone to the hospital that day. I explained to her that I do believe that was a God thing and it was just the perfect timing that he had for her at that time. 06-14-2022 upon evaluation today patient appears to be doing well currently in regard to her legs. In general I think she is making good progress and I do not see any signs of infection at this time which is great news. No fevers, chills, nausea, vomiting, or diarrhea. 06-28-2022 upon evaluation today patient appears to be doing excellent in regard to her legs. She is actually showing signs of excellent improvement I do not see any evidence of infection locally nor systemically at this time. No fevers, chills, nausea, vomiting, or diarrhea. 07-26-2022 upon eval evaluation today patient appears to be doing well currently in regard to her wound. She has been tolerating the dressing changes without complication. Fortunately I do not see any major issues here she has a couple areas of weeping mainly on the right leg the left leg seems to be doing pretty [name], [name] ([id number]) [id number] Page 6 of 14 well. 08-23-2021 upon evaluation today patient appears to be doing excellent in regard to her legs. These are showing signs of wonderful improvement I am actually very pleased with where we stand I think she is doing the  best that I have ever seen her. Fortunately I do not see any evidence of infection locally or systemically which is great news. 09-27-2022 upon evaluation today patient appears to be doing well she has no open wounds at this time her lymphedema seems to be doing quite well at this point. Fortunately I do not see any signs of active infection locally nor systemically which is great news and overall her swelling though up a little bit is not nearly as bad as it has been at times. No evidence of cellulitis currently. 10-25-2022 upon evaluation patient appears to be doing excellent in regard to her wounds currently. I do not see anything open that this is great her leg still has the bilateral lymphedema but again this is not nearly as severe as what it has been in the past I think she is doing really well. She is using the pumps when she can although she does tell me there is been some issue there with her arthritis at times not being able to do what she needs to do. 11-27-2022 upon evaluation today patient appears to be doing well currently in regard to her legs which do not show any signs of opening she still has significant lymphedema bilaterally. Fortunately I do not see any evidence of active infection locally nor systemically which is great news. 12-25-2022 upon evaluation today patient appears to be doing excellent in fact she is showing signs of her legs still doing great with no openings and overall very pleased in that regard. She has been using her lymphedema wraps that she purchased and I do seem to be helping her she tells me. Extremely pleased with this. Electronic Signature(s) Signed: 12/28/2022 7:38:40 AM By: Allen Derry PA-C Entered By: Allen Derry on 12/28/2022 07:38:40 -------------------------------------------------------------------------------- Physical Exam Details Patient Name: Date of Service: LEVETTE, SIERZEGA 12/25/2022 10:45 A [name] Medical Record Number: [id number] Patient  Account Number: [id number] Date of Birth/Sex: Treating RN: June 19, 1956 (67 y.o. [name] Primary Care Provider: [name] Crutch Other Clinician: Betha Loa Referring Provider: Treating Provider/Extender:  Stone, Kassie Mends, Bonita Quin Weeks in Treatment: 73 Constitutional Obese and well-hydrated in no acute distress. Respiratory normal breathing without difficulty. Psychiatric this patient is able to make decisions and demonstrates good insight into disease process. Alert and Oriented x 3. pleasant and cooperative. Notes Upon inspection patient's wound bed actually showed signs again of being There is nothing open everything is close she seems to be doing very well and even with the lymphedema does not appear to be too significant as far as an issue is concerned here and very pleased. Electronic Signature(s) Signed: 12/28/2022 7:38:58 AM By: Allen Derry PA-C Entered By: Allen Derry on 12/28/2022 07:38:58 [name], [name] ([id number]) [id number] Page 7 of 14 -------------------------------------------------------------------------------- Physician Orders Details Patient Name: Date of Service: BEZA, BARTLEY 12/25/2022 10:45 A [name] Medical Record Number: [id number] Patient Account Number: [id number] Date of Birth/Sex: Treating RN: [birth year] (67 y.o. 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 Wash wounds with antibacterial soap and water. May shower; gently cleanse wound with antibacterial soap, rinse and pat dry prior to dressing wounds No tub bath. Edema Control - Lymphedema / Segmental Compressive Device / Other Elevate, Exercise Daily and A void Standing for Long Periods of Time. Elevate leg(s) parallel to the floor when sitting. DO YOUR BEST to sleep in the bed at night. DO NOT sleep in your recliner. Long hours of sitting in a recliner leads to swelling of the legs and/or potential wounds on your backside. Other: - use lymphedema pumps Non-Wound Condition Bilateral Lower Extremities Cleanse affected area with antibacterial soap and water, Medications-Please add to medication list. ntibiotic - apply Mupirocin to weepy areas as needed Topical A Electronic Signature(s) Signed: 12/25/2022 4:54:19 PM By: Betha Loa Signed: 12/27/2022 5:52:14 PM By: Allen Derry PA-C Entered By: Betha Loa on 12/25/2022 11:34:39 -------------------------------------------------------------------------------- Problem List Details Patient Name: Date of Service: CONI, NAGAI 12/25/2022 10:45 A Mylinda Latina ([id number]) [id number] Page 8 of 14 Medical Record Number: [id number] Patient Account Number: [id number] Date of Birth/Sex: Treating RN: 10-16-1955 (67 y.o. 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 [name] Primary Care Provider: [name] Crutch Other Clinician: Betha Loa Referring Provider: Treating Provider/Extender: Wynona Dove in Treatment:  73 Subjective Chief Complaint Information obtained from Patient Bilateral LE lymphedema with Left LE Ulcers History of Present Illness (HPI) The patient is a 67 year old female with history of hypertension and a long-standing history of bilateral lower extremity lymphedema (first presented on 4/2) . She has had open ulcers in the past which have always responded to compression therapy. She had briefly been to a lymphedema clinic in the past which helped her at the time. this time around she stopped treatment of her lymphedema pumps approximately 2 weeks ago because of some pain in the knees and then noticed the right leg getting worse. She was seen by her PCP who put her on clindamycin 4 times a day 2 days ago. The patient has seen [facility] and Dr. Gilda Crease had seen her last year where a vascular study including venous and arterial duplex studies were within normal limits. he had recommended compression stockings and lymphedema pumps and the patient has been using this in about 2 weeks ago. She is known to be diabetic but in the past few time she's gone to her primary care doctor her hemoglobin A1c has been normal. 02/11/2015 - after her last visit she took my advice and went to the ER regarding the progressive cellulitis of her right lower  extremity and she was admitted between July 17 and 22nd. She received IV antibiotics and then was sent home on a course of steroid-induced and oral antibiotics. She has improved much LETRISHA, STANGLE ([id number]) [id number] Page 9 of 14 since then. 02/17/2015 -- she has been doing fine and the weeping of her legs has remarkably gone down. She has no fresh issues. READMISSION 01/15/18 This patient was given this clinic before most recently in 2016 seen by Dr. Meyer Russel. She has massive bilateral lymphedema and over the last 2 months this had weeping edema out of the left leg. She has compression pumps but her compliance with these has been minimal. She has [facility] they've been using TCA/ABDs/kerlix under an Ace wrap.she has had recent problems with cellulitis. She was apparently seen in the ER and 12/23/17 and given clindamycin. She was then followed by her primary doctor and given doxycycline and Keflex. The pain seems to have settled down. In April 2018 the patient had arterial studies done at [facility]. This showed triphasic waveforms throughout the right leg and mostly triphasic waveforms on the left except for monophasic at the posterior tibial artery distally. She was not felt to have evidence of right lower extremity arterial stenosis or significant problems on the left side. 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 She also had a right lower extremity venous Doppler in January 2018 this was limited by the patient's body habitus and lymphedema. Most of the proximal veins were not visualized The patient presents with an area of denuded skin on the anterior medial part of the left calf. There is weeping edema fluid here. 01/22/18; the patient has somewhat better edema control using her compression pumps twice a day and as a result she has much better epithelialization on the left anterior calf area. Only a small open  area remains. 01/29/18; the patient has been compliant with her compression pumps. Both the areas on her calf that healed. The remaining area on the left anterior leg is fully epithelialized Readmission: 02/20/2019 upon evaluation today patient presents for reevaluation due to issues that she is having with the bilateral lower extremities. She actually has wounds open on both legs.  On the right she has an area in the crease of her leg on the right around the knee region which is actually draining quite a bit and actually has some fungal type appearance to it. She has been on nystatin powder that seems to have helped to some degree. In regard to the left lower extremity this is actually in the lower portion of her leg closer to the ankle and again is continuing to drain as well unfortunately. There does not appear to be any signs of active infection at this time which is good news. No fevers, chills, nausea, vomiting, or diarrhea. She tells me that since she was seen last year she is actually been doing quite well for the most part with regard to her lower extremities. Unfortunately she now is experiencing a little bit more drainage at this time. She is concerned about getting this under control so that it does not get significantly worse. 02/27/2019 on evaluation today patient appears to be doing somewhat better in regard to her bilateral lower extremity wounds. She has been tolerating the dressing changes without complication. Fortunately there is no signs of active infection at this point. No fevers, chills, nausea, vomiting, or diarrhea. She did get her dressing supplies which is excellent news she was extremely excited to get these. She also got paperwork from [employer] for their financial assistance program where they may be able to help her out in the future if needed with supplies at discounted prices. 03/06/2019 on evaluation today patient appears to be doing a little worse with regard to both areas  of weeping on her bilateral lower extremities. This is around the right medial knee and just above the left ankle. With that being said she is unfortunately not doing as well as I would like to see. I feel like she may need to potentially go see someone at the lymphedema clinic as the wraps that she needs or even beyond what we can do here at the wound care center. She really does not have wounds she just has open areas of weeping that are causing some difficulty for her. Subsequently because of this and the moisture I am concerned about the potential for infection I am going to likely give her a prophylactic antibiotic today, Keflex, just to be on the safe side. Nonetheless again there is no obvious signs of active infection at this time. 03/13/2019 on evaluation today patient appears to be doing well with regard to her bilateral lower extremities where she has been weeping compared to even last week's evaluation. I see some areas of new skin growth which is excellent and overall I am very pleased with how things seem to be progressing. No fevers, chills, nausea, vomiting, or diarrhea. 03/20/2019 on evaluation today patient unfortunately is continuing to have issues with significant edema of the left lower extremity. Her right side seems to be doing much better. Unfortunately her left side is showing increased weeping of the lower portion of her leg. This is quite unfortunate obviously we were hoping to get her into the lymphedema clinic they really do not seem to when I see her how if she is draining. Despite the fact this is really not wound related but more lymphedema weeping related. Nonetheless I do not know that this can be helpful for her to even go for that appointment since again I am not sure there is much that they would actually do at this point. We may need to try a 4 layer compression  wrap as best we can on her leg. She is on the Augmentin currently although I am still concerned about  whether or not there could be potentially something going on infection wise I would obtain a culture though I understand is not the best being that is a surface culture I just 1 to make sure I do not seem to be missing anything. 03/27/2019 on evaluation today patient appears to be doing much better in regard to the left lower extremity compared to last week. Last week she had tremendous weeping which I think was subsequent to infection now she seems to be doing much better and very pleased. This is not completely healed but there is a lot of new skin growth and it has dried out quite a bit. Overall I think that we are doing well with how things are moving along at this time. No fevers, chills, nausea, vomiting, or diarrhea. 04/03/2019 evaluation today patient appears to be doing a little worse this week compared to last time I saw her. I think this may be due to the fact that she is having issues with not being able to sleep in her bed at least not until last night. She is therefore been in a lift chair and subsequently has also had issues with not been able to use her pumps since she could not get in bed. With that being said the patient overall seems to be doing okay I do think I may want extend the antibiotic for a little bit longer at least until we can see if her edema and her weeping gets better and if it is then obviously I can always discontinue the antibiotics as of next week however I want her to continue to have it over the next week. 04/10/2019 on evaluation today patient unfortunately is still doing poorly with regard to her left lower extremity. Her right is all things considering doing fairly well. On the left however she continues to have spreading of the area of infection and weeping which appears to be even a larger surface area than noted last week. She did have a positive culture for Pseudomonas in particular which seems to have been of concern she still has green/yellow discharge  consistent with Pseudomonas and subsequently a tremendous amount of it. This has me obviously still concerned about the infection not really clearing up despite the fact that on culture it appears the Cipro should have been a good option for treating this. I think she may at this point need IV antibiotics since things are not doing better I do not want to get worse and cause sepsis. She is in agreement with the plan and believes as well that she likely does need to go to the hospital for IV vancomycin. Or something of the like depending on what the recommendation is from the ER. 04/17/2019 on evaluation today patient appears to be doing excellent in regard to her lower extremity on the left. She was in the hospital for several days from when I sent her last we saw her until just this past Tuesday. Fortunately her drainage is significantly improved and in fact is mostly clear. There is just a couple small areas that may still drain a little bit she states that the [facility] they prescribed for her at discharge she went picked up from pharmacy and got home but has not been able to find it since. She is looked everywhere. She is wondering if I will replace that for her today I  will be more than happy to do that. 05/01/2019 on evaluation today patient actually appears to be doing quite well with regard to her lower extremities. She occasionally is having areas that will leak and then heal up mainly when a piece of the fibrotic skin pops off but fortunately she is not having any signs of active infection at this time. Overall she also really does not have any obvious weeping at this time. I do believe however she really needs some compression wraps and I think this may be a good time to get her back to the lymphedema clinic. 05/11/2019 on evaluation today patient actually appears to be doing quite well with regard to her bilateral lower extremities. She occasionally will have a small area that we per another  but in general seems to be completely healed which is great news. Overall very pleased with how everything seems to be progressing. She does have her appointment with lymphedema clinic on November 18. 05/25/2019 on evaluation today patient appears to be doing well with regard to her left lower extremity. I am very pleased in this regard. In regard to her right leg this actually did start draining more I think it is mainly due to the fact that her leg is more swollen. I am not seeing any obvious signs of infection at this [name], [name] ([id number]) [phone].pdf Page 10 of 14 time although that is definitely something were obviously acutely aware of simply due to the fact that she had an issue not too far back with exactly this issue. Nonetheless I do feel like that lymphedema clinic would still be beneficial for her. I explained obviously if they are not able to do anything treatment wise on the right leg we could at least have them treat her left leg and then proceed from there. The patient is really in agreement with that plan. If they are able to do both as the drainage slows down that I would be happy to let them handle both. 06/01/2019 on evaluation today patient unfortunately appears to be doing worse with regard to her right lower extremity. The left lower extremity is still maintaining at this point. Unfortunately she has been having significantly increased pain over the past several days and has been experiencing as well increased swelling of the right lower extremity. I really do not know that I am seeing anything that appears to be obvious for infection at this point to be peripherally honest. With that being said the patient does seem to be having much more swelling that she is even experienced in the past and coupled with increased pain in her hip as well I am concerned that again she could potentially have a DVT although I am not 100% sure of this. I think it  something that may need to be checked out. We discussed the possibility of sending her for a DVT study through the hospital but unfortunately transportation is an issue if she does have a DVT I do not want her to wait days to be able to get in for that test however if she has this scheduled as an outpatient that is as fast that she will be able to get the test scheduled for transportation purposes. That will also fall on Thanksgiving so subsequently she did actually be looking at either Friday or even next week before we would know anything back from this. That is much too long in my opinion. Subsequent to the amount of discomfort she is experiencing the patient  is actually okay with going to the ER for evaluation today. 06/12/2019 on evaluation today patient actually appears to be doing significantly better compared to last time I saw her. Following when I last saw her she was actually in the hospital from that Monday until the following Sunday almost 1 full week. She actually was placed on Keflex in the hospital following the time for her to be discharged and Dr. Joylene Draft has recommended 2 times a day dosing of the Keflex for the next year in order to help with more prophylactic/preventative measures with regard to her developing cellulitis. Overall I think this sounds like an excellent plan. The patient unfortunately is good to have trouble being treated at lymphedema clinic due to the fact that she really cannot get up on the bed that they have there. They also state that they cannot manage her as long as she has anything draining at this point. Obviously that is somewhat unfortunate as she does need help with edema control but nonetheless we will have to do what we can for her outside of it sounds like the lymphedema clinic scenario at this point. 06/19/2019 on evaluation today patient appears to be doing fairly well with regard to her bilateral lower extremities. She is not nearly as swollen and  shows no signs of infection at this point. There is no evidence of cellulitis whatsoever. She also has no open wounds or draining at this point which is also good news. No fever chills noted. She seems to be in very good spirits and in fact appears to be doing quite well. READMISSION 11/27/2019 This is a 67 year old woman that we have had in this clinic several times before including 2015, 16 and 19 and then most recently from 03/20/2019 through 06/19/2019 with bilateral lower extremity lymphedema. She has had previous arterial and reflux studies done years ago which were not all that remarkable. 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 She does have a positive family history and she had large legs starting may be in her 50s. She was recently in hospital from 10/20/2019 through 10/28/2019 with right leg cellulitis. She was given Ancef and clindamycin and then Zosyn when a culture showed Pseudomonas. At that time there was purulent drainage. She was followed by infectious disease Dr Joylene Draft. The patient is now back at home. She has noted increased swelling in the right and no drainage in her right leg mostly on the posterior medial aspect in the calf area. She has not had pain or fever. She has literally been improved lysing above dressings because her at the area of this is far too large for standard compression. She has been wrapping the areas with sheets to resorptive pads. She is found these helped somewhat. She does have an appointment with the lymphedema clinic in [place] in late June. Past medical history includes bilateral lymphedema, hypertension, obstructive sleep apnea with CPAP. Recent hospitalization with apparently Pseudomonas cellulitis of the right lower leg 12/15/2019 upon evaluation today patient appears to be doing a little bit worse in regard to her right lower extremity. Unfortunately she is having more weeping down in the  lower portion of her leg. Fortunately there is no signs of active infection at this time. No fever chills noted. The patient states she is not having increased pain except for when she attempted to use the lymphedema pumps unfortunately she states that she did have pain when she did this. Otherwise we been  using absorptive dressings of one type or another she is using diapers at home and then subsequently Ace wraps. In regard to the barrier cream we have discussed the possibility of derma cloud which she would like to try I do not have a problem with that. 12/22/2019 upon evaluation today patient actually appears to be doing better in regard to her leg ulcers at this point. Fortunately there does not appear to be any signs of active infection which is great news and I am extremely pleased with where things are progressing at this time. There is no sign of active infection currently. The patient is very pleased to see things doing so well. 12/29/2019 upon evaluation today patient appears to be doing a little bit better in regard to her weeping in general over her lower extremities. She does have some signs of mild erythema little bit more than what I noted last week or rather last visit. Nonetheless I think that my threshold for switching her antibiotics from Keflex to something else is very low at this point considering that she has had such severe infections in the past that seem to come almost out of nowhere. There is a little erythema and warmth noted of the lower portion of her leg compared to the upper which also makes me want to go ahead and address things more rapidly at this point. Likely I would switch out the Keflex for something like Levaquin ideally. 7/16; patient with severe bilateral lymphedema. She has superficial wounds albeit almost circumferential now on the left lateral lower leg. This may be new from last time. Small area on the right anterior lower leg and then another area on the  right medial lower leg and of pannus fold. She has been using various absorptive garments. She states she is using her compression pumps once a day occasionally twice. Culture from her last visit here was negative 01/29/2020 on evaluation today patient appears to be doing excellent at this point in regard to her legs with regard to infection I see no signs of active infection at this point. She still does have unfortunately areas of weeping this is minimal on the right now her left is actually significantly worse although I do not think it is as bad as last week with Dr. Leanord Hawking saw her. She has been trying to pump and elevate her legs is much as possible. She has previously been on the Keflex and in the past for prevention that seems to do fairly well and likely can extend that today. 02/04/2020 on evaluation today patient appears to be doing better in regard to her legs bilaterally. Fortunately there is no signs of active infection at this time which is great news and overall she has less weeping on the left compared to the right and there is several spots where she is pretty much sealed up with no draining regions. Overall very pleased in this regard. 02/19/2020 on evaluation today patient appears to be doing very well in regard to her wounds currently. Fortunately there is no evidence of active infection overall very pleased with where things stand. She is significantly improved in regard to her edema I am extremely pleased in this regard she tells me that the popping no longer hurts and in fact she actually looks forward to it. 03/04/2020 on evaluation today patient appears to be doing excellent in regard to her lower extremities. Fortunately there is no signs of active infection at this time. No fevers, chills, nausea, vomiting, or diarrhea.  03/25/2020 on evaluation today patient appears to be doing a little bit more poorly in regard to her legs at this point. She tells me that she is still continue  to have issues with drainage and this has been a little bit worse she was getting ready to start taking the Keflex again but wanted to see me first. Fortunately there is no signs of active infection at this time. No fevers, chills, nausea, vomiting, or diarrhea. 04/15/2020 upon evaluation today patient appears to be doing somewhat poorly in regard to her right leg. She tells me she has been having more pain she has been taking the Keflex that was previously prescribed unfortunately that just does not seem to help with this. She was hoping that the pain on her right was actually coming from the fact that she was having issues with her wrap having gotten caught in her recliner. With that being said she tells me that she knew something was not right. Currently her right leg is warm to touch along with being erythematous all the way up to around at least mid thigh as far as I can see. The left leg does not appear to be doing that badly though there is increased weeping around the ankle region. 05/06/2020 on evaluation today patient appears to be doing much better than last time I saw her. She did go to the hospital where she was admitted for 2 days and treated with antibiotic therapy. She was discharged with antibiotics as well and has done extremely well. I am extremely pleased with where things stand [name], [name] ([id number]) [phone].pdf Page 11 of 14 today. There is no signs of active infection at this time which is great news. 05/27/2020 upon evaluation today patient appears to be doing well with regard to her lower extremities bilaterally. She has just a very tiny area on the right leg which is opening on the left leg she is significantly improved though she still has several areas that do appear to be open this is minimal compared to what is been in the past. In general I am extremely pleased with where things stand today. The patient does tell me she is not been using  her pumps quite as much as she should be. I do believe that is 1 area she can definitely work on. She has had a lot going on including a Covid exposure and apparently also a outbreak of likely shingles. 06/24/2020 upon evaluation today patient appears to be doing well with regard to her legs in general although the left leg unfortunately is showing some signs of erythema she does have a little bit of increased weeping and to be honest I am concerned about infection here. I discussed that with her today and I think that we may need to address this sooner rather than later she has been taking Keflex she is not really certain that is been making a big improvement however. No fevers, chills, nausea, vomiting, or diarrhea. 06/30/2020 upon evaluation today patient's legs actually seem to be doing better in my opinion as compared to where they were last week. Fortunately there does not appear to be any signs of active infection. Her culture showed multiple organisms nothing predominate. With that being said the Levaquin seems to have done well I think she has improved since I last saw her as well. 07/14/2020 upon evaluation today patient appears to be doing actually better in regard to her lower extremities in my opinion. She has been tolerating the  dressing changes without complication. Fortunately there is no signs of active infection at this time. 08/04/2020 upon evaluation today patient appears to be doing excellent in regard to her leg ulcers. Fortunately she has very little that is open at this point. This is great news. In fact I think that the Gold[employer] medicated powder has been excellent for her. It seems to have done the trick where we had tried several other things without as much success. Fortunately there is no evidence of active infection at this time. No fevers, chills, nausea, vomiting, or diarrhea. 09/01/2020 upon evaluation today patient appears to be doing a little bit more poorly than the last  time I saw her. She tells me right now that she has been having a lot of drainage compared to where things were previous which has unfortunately led to more irritation as well. She is concerned that this is leading to infection. Again based on what I am seeing today as well I am also concerned of the same to be honest. 09/15/2020 upon evaluation today patient appears to be doing well with regard to her legs compared to what I saw previous. Fortunately there does not appear to be any evidence of active infection at this time which is great news. At least not as badly as what it was previous. With that being said I do believe that the patient does need to have an extension of the antibiotics. This I believe will actually help her more in the way of making sure this stays under control and does not worsen. That was the Bactrim DS. Readmission: 08/01/2021 upon evaluation today patient appears to be doing actually pretty well all things considered. Is actually been almost a year since have seen her last March. Fortunately I do not see any evidence of active infection locally nor systemically at this point. She does have a couple open areas on the left leg the right leg appears to be doing quite well. In general she has been in the hospital 3 times since I saw her a year ago all 4 cellulitis type issues except for the last 1 which was actually more related to congestive heart failure for that reason she is not using her lymphedema pumps at this point and that is probably the best idea. 08/08/2021 upon evaluation today patient appears to be doing well with regard to her legs all things considered her left leg may be a little bit more swollen based on what I see today. Fortunately I do not see any signs of active infection locally nor systemically at this time which is great news. No fevers, chills, nausea, vomiting, or diarrhea. 08/15/2021 upon evaluation today patient actually appears to be doing excellent in  regard to her wounds. She has been tolerating the dressing changes without complication. Fortunately I see no evidence of infection currently which is great news. No fevers, chills, nausea, vomiting, or diarrhea. 08/29/2021 upon evaluation patient appears to be doing excellent she in fact is almost completely healed. I am actually very pleased with where we stand today and I think that she is making wonderful progress she just has a small area of weeping on the right lateral leg everything else is pretty much completely healed which is also. 09/05/2021 upon evaluation today patient appears to be doing well with regard to her legs. She does have a small area of weeping on the right leg and there is a small area on the left leg as well. It is potentially something that  may need to be addressed in the future more specifically but right now I think that there may have just been a little drainage here on the left. With all that being said I think that this still does not appear to be infected and overall I feel like she is doing quite well. That something we always have to keep a very close eye on as she can change very rapidly from okay to not okay. 09/12/2021 upon evaluation today patient appears to be doing a little bit worse in regard to her leg and actually somewhat concerned about the possibility of infection here. I discussed that with the patient. For that reason I am going to go ahead and see about getting her set up for a repeat prescription for the Bactrim. She is in agreement with that plan. 3/14; patient presents for follow-up. She has been using silver alginate with dressing changes. She is still taking Bactrim prescribed at last clinic visit. She has no issues or complaints today. She has lymphedema pumps however does not use these. 09/26/2020 upon evaluation today patient appears to actually be doing well in regard to her right leg I am pleased in that regard. Unfortunately she has new areas on  her left leg that are open at this point that I do need to be addressed. I am also concerned about the warmth around one of the wounds on the more anterior side. I think this is something that we may need to culture today potentially requiring antibiotics going forward. 10/03/2021 upon evaluation today patient unfortunately is not doing nearly as well as she was even last week with her wounds. She is having some issues here with increased cellulitis of the left lower extremity unfortunately. I did prescribe Augmentin for her eczema prescribed last week unfortunately she never actually received this prescription. The pharmacy stated they never got it. We double checked with them today they still did not receive it. I am not sure what happened in that regard. With that being said the patient is in good spirits today she does not appear to be as sick as where she normally is when the cellulitis gets significantly worse as it has in the past. Nonetheless the leg is much more red not just around the ankle where I was seeing at last week on Tuesday but rather now this is going all the way up to almost her knee and is definitely hot to touch compared to the right leg. Nonetheless I feel like she does need to go to the ER for likely IV antibiotics. 10-10-2021 upon evaluation today patient appears to be doing well with regard to her wound. She has been tolerating the dressing changes that appears to be doing much better. After I saw her last week she did go to the ER they admitted her to the hospital and gave her IV antibiotics she fortunately is doing significantly better. Overall I am extremely pleased with where things stand today. 10-17-2021 upon evaluation today patient appears to be doing well with regard to her wound. In fact this is looking better and her leg is looking better but she still does have some areas here of erythema. We will continue to address this as soon as possible in my opinion. I do believe  she may require some debridement and what appears to be a more defined wound on her left leg at this point. 10-24-2021 upon evaluation patient is definitely showing signs of improvement which is great news. I do not  see any evidence of active infection locally or systemically which is great news as well. No fevers, chills, nausea, vomiting, or diarrhea. 10-31-2021. Upon evaluation today patient's leg actually feels better from an infection and wound care perspective. I am actually very pleased in this regard. Unfortunately the biggest issue that I see at this time is that the patient is having a significant issue with her breathing. I did put her on the pulse ox machine to check her oxygen saturation and it was pretty much at 100% which is good but she tells me that when she is walking this is much more difficult for her. She also tells me that when she is trying to bend over to perform her dressing changes she is so short of breath due to the swelling and fluid in her abdominal area that she is just not been able to do this. She is tearful and very concerned today to be honest. I am truly sorry to see her this way I know she is really having [name], [name] ([id number]) [id number] Page 12 of 14 a hard time at the moment. 11-14-2021 upon evaluation patient actually appears to be doing significantly better compared to when I last saw her. She was actually admitted to the hospital I believe this for 6 days total after I sent her on 10-31-2021. Subsequently they actually pulled off greater than 50 pounds of fluid she tells me she feels significantly better her legs are not as tight she actually has some play in the tissue and it is obvious that she is doing much better just from a mobility standpoint as well as a breathing standpoint. Overall I am extremely happy for her and how she is doing the leg also looks much better on the left. 11-21-2021 upon evaluation today patient  appears to be doing well although it does appear that she is going require some sharp debridement the wound is very dry this is the opposite of what its been she was having so much weeping that it was staying extremely wet. Nonetheless we do need to see about going ahead and debriding the wound today. 11-28-2021 upon evaluation today patient appears to be doing well with regard to her wound I definitely see signs of things continuing to improve which is great news. I do not see any evidence of active infection locally or systemically also great news. 12-05-2021 upon evaluation today patient appears to be doing well with regard to her wound. She has been tolerating the dressing changes. Fortunately there does not appear to be any signs of active infection locally or systemically at this time. No fevers, chills, nausea, vomiting, or diarrhea. 12-12-2021 upon evaluation patient appears to be doing well with regard to her wound this is actually measuring smaller and looking much better. Fortunately I do not see any signs of active infection locally or systemically at this time which is excellent news. 6/13; small wound area in the folds of her lymphedema just above the left ankle. Fortunately the area looks quite good. She has been using silver alginate ABDs and an Ace wrap which she is able to change her self. She is not using her compression pumps out of fear that this could contribute to heart failure. 12-26-2021 upon evaluation patient's wounds are actually showing signs of excellent improvement. I am very pleased with where things stand. Overall I think that she has been making excellent progress and I think we are very close to complete resolution which is great news. 01-11-2022  upon evaluation today patient appears to be doing well currently in regard to her legs in general although on the left leg she does have 1 area that still irritated and inflamed on the anterior portion of her leg. Fortunately I do  not see any signs of systemic infection locally there might be some infection going on here however which is my biggest concern. Fortunately I do not see any evidence though of this spreading to any other location. 01-16-2022 upon evaluation today patient has actually showing signs of excellent improvement. Fortunately I do not see any evidence of infection locally or systemically which is great news and overall I am very pleased with where we stand at this point. 01-25-2022 upon evaluation today patient actually appears to be doing decently well in regard to her legs. She has been tolerating the dressing changes without complication. Fortunately there does not appear to be any evidence of active infection locally or systemically which is great news and overall I am extremely pleased with where we stand at this point. 02-13-2022 upon evaluation today patient appears to be doing well currently in regard to her wound which is actually showing signs of significant improvement. Fortunately I do not see any evidence of active infection locally or systemically at this time. No fevers, chills, nausea, vomiting, or diarrhea. 02-20-2022 upon evaluation today patient appears to be doing well currently in regard to her wound. She has been tolerating the dressing changes without complication. Fortunately there does not appear to be any signs of active infection locally or systemically at this time. No fevers, chills, nausea, vomiting, or diarrhea. 02-27-2022 upon evaluation today patient appears to be doing excellent in regard to her wounds on the leg. She has been tolerating the dressing changes without complication this is very close to complete resolution. Fortunately I do not see any signs of infection locally or systemically at this time which is great news. No fevers, chills, nausea, vomiting, or diarrhea. 03-06-2022 upon evaluation today patient appears to be doing well currently in regard to her wound in  general. She has been tolerating the dressing changes without complication. Fortunately I do not see any evidence of active infection locally or systemically at this time which is great news. I am concerned a little bit however about not really her wounds but the second toe right foot where there appears to be some cellulitis after she dropped a frying pan on this about 2 weeks ago. She tells me its been itching and burning and I do think that it is possible she may have an infection based on what we are seeing currently. 03-20-2022 upon evaluation today patient actually appears to be doing much better at this point. Fortunately there does not appear to be any signs of active infection locally or systemically at this time which is great news. No fevers, chills, nausea, vomiting, or diarrhea. 03-27-2022 upon evaluation today patient appears to be doing somewhat poorly in regard to her legs compared to previous. Her right leg has a couple areas that are open and she is very warm and painful to touch around the lateral portion of the ankle area. Left leg is showing signs of little bit more drainage and weeping as well I think that this is probably due to the fact that she is swelling a lot more that she has been in the past. Fortunately I do not see any signs of active infection systemically though locally there is definitely some issues here. 04-03-2022 upon evaluation today  patient appears to be doing poorly in regard to her wounds on the legs unfortunately. She also seems to have more significant cellulitis that has ensued. Unfortunately she has been on doxycycline initially it seemed to be helping but as of this week that is no longer the case. It started getting worse last week and with the reinitiation of the doxycycline nothing has improved. Often when she gets like this IV antibiotics are necessary. 04-19-2022 upon evaluation today patient appears to be doing well currently in regard to her legs from  an infection standpoint. She did get IV vancomycin in the hospital and states that her legs did get better when she left she was no longer weeping but she had been in the bed for 4 days straight. With that being said since coming home her legs have begun to weep again the original wound that we were dealing with has healed she does have some weeping over both lower extremities however which is still of concern. 05-03-2022 upon evaluation today patient appears to be doing well currently in regard to her left leg which I feel like is a little bit better unfortunately the right leg is still draining significantly. Fortunately I do not see any signs of infection locally or systemically at this time. No fevers, chills, nausea, vomiting, or diarrhea. 05-10-2022 unfortunately patient actually showed signs of increasing erythema today. I believe that the cellulitis never completely resolved from when she was in the hospital last. Again she has been on doxycycline previously by myself I gave her Keflex more recently as an ongoing prescription to try to prevent infection but again I do not think this is strong enough to be able to get this under control. I really feel like that she is probably can need IV antibiotics and a good period of time in the hospital to be able to keep her legs elevated and not having to get around to do a whole lot in order to allow this to resolve. The patient voiced understanding. 05-24-2022 upon evaluation today patient appears to be doing excellent in regard to her legs. She has been tolerating the dressing changes without complication. Fortunately there does not appear to be any signs of infection at this time which is great news she has been on cefadroxil since she was discharged from the hospital. The day that we sent her she actually tells me that she ended up in septic shock that day and coded. When she first showed up they did not notice it on the lab work but said by the end  of the day she had already undergone this and they had had to address the issue obviously when she coded. She tells me that likely she would not be here today if she had not gone to the hospital that day. I explained to her that I do believe that was a God thing and it was just the perfect timing that he had for her at that time. 06-14-2022 upon evaluation today patient appears to be doing well currently in regard to her legs. In general I think she is making good progress and I do not see any signs of infection at this time which is great news. No fevers, chills, nausea, vomiting, or diarrhea. MERRICK, PROBST ([id number]) [id number] Page 13 of 14 06-28-2022 upon evaluation today patient appears to be doing excellent in regard to her legs. She is actually showing signs of excellent improvement I do not see any evidence of infection locally nor systemically  at this time. No fevers, chills, nausea, vomiting, or diarrhea. 07-26-2022 upon eval evaluation today patient appears to be doing well currently in regard to her wound. She has been tolerating the dressing changes without complication. Fortunately I do not see any major issues here she has a couple areas of weeping mainly on the right leg the left leg seems to be doing pretty well. 08-23-2021 upon evaluation today patient appears to be doing excellent in regard to her legs. These are showing signs of wonderful improvement I am actually very pleased with where we stand I think she is doing the best that I have ever seen her. Fortunately I do not see any evidence of infection locally or systemically which is great news. 09-27-2022 upon evaluation today patient appears to be doing well she has no open wounds at this time her lymphedema seems to be doing quite well at this point. Fortunately I do not see any signs of active infection locally nor systemically which is great news and overall her swelling though up a little bit is  not nearly as bad as it has been at times. No evidence of cellulitis currently. 10-25-2022 upon evaluation patient appears to be doing excellent in regard to her wounds currently. I do not see anything open that this is great her leg still has the bilateral lymphedema but again this is not nearly as severe as what it has been in the past I think she is doing really well. She is using the pumps when she can although she does tell me there is been some issue there with her arthritis at times not being able to do what she needs to do. 11-27-2022 upon evaluation today patient appears to be doing well currently in regard to her legs which do not show any signs of opening she still has significant lymphedema bilaterally. Fortunately I do not see any evidence of active infection locally nor systemically which is great news. 12-25-2022 upon evaluation today patient appears to be doing excellent in fact she is showing signs of her legs still doing great with no openings and overall very pleased in that regard. She has been using her lymphedema wraps that she purchased and I do seem to be helping her she tells me. Extremely pleased with this. Objective Constitutional Obese and well-hydrated in no acute distress. Vitals Time Taken: 11:12 AM, Height: 63 in, Weight: 372 lbs, BMI: 65.9, Temperature: 97.6 F, Pulse: 90 bpm, Respiratory Rate: 18 breaths/min, Blood Pressure: 105/73 mmHg. Respiratory normal breathing without difficulty. Psychiatric this patient is able to make decisions and demonstrates good insight into disease process. Alert and Oriented x 3. pleasant and cooperative. General Notes: Upon inspection patient's wound bed actually showed signs again of being There is nothing open everything is close she seems to be doing very well and even with the lymphedema does not appear to be too significant as far as an issue is concerned here and very pleased. Other Condition(s) Patient presents with Lymphedema  located on the Right Leg. General Notes: Patient denies any pain or discomfort no redness, or drainage noted Patient presents with Lymphedema located on the Left Leg. 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 Wash wounds with antibacterial soap and water. May shower; gently cleanse wound with antibacterial soap, rinse and pat dry prior to dressing wounds No tub bath. Edema Control - Lymphedema / Segmental Compressive Device / OtherPATRYCIA, MOUNTZ ([id number]) [id number] Page 14 of 14 Elevate, Exercise Daily and Avoid Standing for Long Periods of Time. Elevate leg(s) parallel to the floor when sitting. DO YOUR BEST to sleep in the bed at night. DO NOT sleep in your recliner. Long hours of sitting in a recliner leads to swelling of the legs and/or potential wounds on your backside. Other: - use lymphedema pumps Non-Wound Condition: Cleanse affected area with antibacterial soap and water, Medications-Please add to medication list.: Topical Antibiotic - apply Mupirocin to weepy areas as needed 1. I would recommend that we have the patient continue to monitor for any signs of infection or worsening. Based on what I am seeing I do believe that we are moving in the right direction here. 2. I am going to recommend that we have the patient continue to monitor for infection. Obviously if anything changes she knows to let me know but right now again there is nothing open Significantly well. We will see patient back for reevaluation in 4  weeks here in the clinic. If anything worsens or changes patient will contact our office for additional recommendations. Electronic Signature(s) Signed: 12/28/2022 7:39:28 AM By: Allen Derry PA-C Entered By: Allen Derry on 12/28/2022 07:39:28 -------------------------------------------------------------------------------- SuperBill Details Patient Name: Date of Service: AVALINA, LIECHTY 12/25/2022 Medical Record Number: [id number] Patient Account Number: [id number] Date of Birth/Sex: Treating RN: Dec 25, 1955 (67 y.o. [name] Primary Care Provider: [name] Crutch Other Clinician: Betha Loa Referring Provider: Treating Provider/Extender: Wynona Dove in Treatment: 73 Diagnosis Coding ICD-10 Codes Code Description Q82.0 Hereditary lymphedema L97.811 Non-pressure chronic ulcer of other part of right lower leg limited to breakdown of skin L97.821 Non-pressure chronic ulcer of other part of left lower leg limited to breakdown of skin Facility Procedures : CPT4 Code: [id number] Description: 30865 - WOUND CARE VISIT-LEV 2 EST PT Modifier: Quantity: 1 Electronic Signature(s) Signed: 12/25/2022 4:54:19 PM By: Betha Loa Signed: 12/27/2022 5:52:14 PM By: Allen Derry PA-C Entered By: Betha Loa on 12/25/2022 11:35:21

## 2022-12-27 ENCOUNTER — Other Ambulatory Visit: Payer: Self-pay | Admitting: Family

## 2022-12-27 NOTE — Progress Notes (Signed)
KELANI, THUNBERG ([id number]) [id number] Page 1 of 8 Visit Report for 12/25/2022 Arrival Information Details Patient Name: Date of Service: [name], [name] 12/25/2022 10:45 A M Medical Record Number: [id number] Patient Account Number: [id number] Date of Birth/Sex: Treating RN: 11/22/1955 (67 y.o. [name] Primary Care Shivaay Stormont: [name] Other Clinician: Betha Loa Referring Najae Rathert: Treating Cressie Betzler/Extender: Wynona Dove in Treatment: 3 Visit Information History Since Last Visit All ordered tests and consults were completed: No Patient Arrived: Dan Humphreys Added or deleted any medications: No Arrival Time: 11:11 Any new allergies or adverse reactions: No Transfer Assistance: None Had a fall or experienced change in No Patient Identification Verified: Yes activities of daily living that may affect Secondary Verification Process Completed: Yes risk of falls: Patient Requires Transmission-Based Precautions: No Signs or symptoms of abuse/neglect since last visito No Patient Has Alerts: Yes Hospitalized since last visit: No Patient Alerts: Borderline Diabetic Implantable device outside of the clinic excluding No cellular tissue based products placed in the center since last visit: Has Dressing in Place as Prescribed: Yes Pain Present Now: No Electronic Signature(s) Signed: 12/25/2022 4:54:19 PM By: Betha Loa Entered By: Betha Loa on 12/25/2022 11:12:04 -------------------------------------------------------------------------------- Clinic Level of Care Assessment Details Patient Name: Date of Service: [name], [name] 12/25/2022 10:45 A M Medical Record Number: [id number] Patient Account Number: [id number] Date of Birth/Sex: Treating RN: 03/30/1956 (67 y.o. [name] Primary Care Madora Barletta: [name] Other Clinician: Betha Loa Referring Ermelinda Eckert: Treating Jamarl Pew/Extender: Wynona Dove in Treatment: 73 Clinic Level of Care Assessment Items TOOL 4 Quantity Score []  - 0 Use when only an EandM is performed on FOLLOW-UP visit ASSESSMENTS - Nursing Assessment / Reassessment X- 1 10 Reassessment of Co-morbidities (includes updates in patient status) X- 1 5 Reassessment of Adherence to Treatment Plan SARAIA, SHIRAR ([id number]) [phone].pdf Page 2 of 8 ASSESSMENTS - Wound and Skin A ssessment / Reassessment []  - 0 Simple Wound Assessment / Reassessment - one wound []  - 0 Complex Wound Assessment / Reassessment - multiple wounds []  - 0 Dermatologic / Skin Assessment (not related to wound area) ASSESSMENTS - Focused Assessment X- 1 5 Circumferential Edema Measurements - multi extremities []  - 0 Nutritional Assessment / Counseling / Intervention []  - 0 Lower Extremity Assessment (monofilament, tuning fork, pulses) []  - 0 Peripheral Arterial Disease Assessment (using hand held doppler) ASSESSMENTS - Ostomy and/or Continence Assessment and Care []  - 0 Incontinence Assessment and Management []  - 0 Ostomy Care Assessment and Management (repouching, etc.) PROCESS - Coordination of Care X - Simple Patient / Family Education for ongoing care 1 15 []  - 0 Complex (extensive) Patient / Family Education for ongoing care []  - 0 Staff obtains Chiropractor, Records, T Results / Process Orders est []  - 0 Staff telephones HHA, Nursing Homes / Clarify orders / etc []  - 0 Routine Transfer to another Facility (non-emergent condition) []  - 0 Routine Hospital Admission (non-emergent condition) []  - 0 New Admissions / Manufacturing engineer / Ordering NPWT Apligraf, etc. , []  - 0 Emergency Hospital Admission (emergent condition) X- 1 10 Simple Discharge Coordination []  - 0 Complex (extensive) Discharge Coordination PROCESS - Special Needs []  - 0 Pediatric / Minor Patient Management []  - 0 Isolation Patient Management []   - 0 Hearing / Language / Visual special needs []  - 0 Assessment of Community assistance (transportation, D/C planning, etc.) []  - 0 Additional assistance / Altered mentation []  - 0 Support Surface(s) Assessment (bed, cushion, seat,  etc.) INTERVENTIONS - Wound Cleansing / Measurement []  - 0 Simple Wound Cleansing - one wound []  - 0 Complex Wound Cleansing - multiple wounds []  - 0 Wound Imaging (photographs - any number of wounds) []  - 0 Wound Tracing (instead of photographs) []  - 0 Simple Wound Measurement - one wound []  - 0 Complex Wound Measurement - multiple wounds INTERVENTIONS - Wound Dressings []  - 0 Small Wound Dressing one or multiple wounds []  - 0 Medium Wound Dressing one or multiple wounds []  - 0 Large Wound Dressing one or multiple wounds []  - 0 Application of Medications - topical []  - 0 Application of Medications - injection INTERVENTIONS - Miscellaneous []  - 0 External ear exam ALAYNA, SVEUM ([id number]) [id number] Page 3 of 8 []  - 0 Specimen Collection (cultures, biopsies, blood, body fluids, etc.) []  - 0 Specimen(s) / Culture(s) sent or taken to Lab for analysis []  - 0 Patient Transfer (multiple staff / Michiel Sites Lift / Similar devices) []  - 0 Simple Staple / Suture removal (25 or less) []  - 0 Complex Staple / Suture removal (26 or more) []  - 0 Hypo / Hyperglycemic Management (close monitor of Blood Glucose) []  - 0 Ankle / Brachial Index (ABI) - do not check if billed separately X- 1 5 Vital Signs Has the patient been seen at the hospital within the last three years: Yes Total Score: 50 Level Of Care: New/Established - Level 2 Electronic Signature(s) Signed: 12/25/2022 4:54:19 PM By: Betha Loa Entered By: Betha Loa on 12/25/2022 11:35:15 -------------------------------------------------------------------------------- Encounter Discharge Information Details Patient Name: Date of Service: [name]  12/25/2022 10:45 A M Medical Record Number: [id number] Patient Account Number: [id number] Date of Birth/Sex: Treating RN: [birth year] (67 y.o. [name] Primary Care Naveen Clardy: [name] Other Clinician: Betha Loa Referring Mikhai Bienvenue: Treating Symon Norwood/Extender: Wynona Dove in Treatment: 44 Encounter Discharge Information Items Discharge Condition: Stable Ambulatory Status: Walker Discharge Destination: Home Transportation: Other Accompanied By: self Schedule Follow-up Appointment: Yes Clinical Summary of Care: Electronic Signature(s) Signed: 12/25/2022 4:54:19 PM By: Betha Loa Entered By: Betha Loa on 12/25/2022 11:38:24 Lower Extremity Assessment Details -------------------------------------------------------------------------------- [name] ([id number]) [id number] Page 4 of 8 Patient Name: Date of Service: [name], [name] 12/25/2022 10:45 A M Medical Record Number: [id number] Patient Account Number: [id number] Date of Birth/Sex: Treating RN: [birth year] (67 y.o. [name] Primary Care Kurt Azimi: [name] Other Clinician: Betha Loa Referring Guss Farruggia: Treating Lenay Lovejoy/Extender: Wynona Dove in Treatment: 73 Edema Assessment Left: Right: Assessed: Yes Yes Edema: Yes Yes Calf Left: Right: Point of Measurement: 34 cm From Medial Instep 81 cm 80 cm Ankle Left: Right: Point of Measurement: 10 cm From Medial Instep 57.5 cm 58.4 cm Vascular Assessment Left: Right: Pulses: Dorsalis Pedis Palpable: Yes Yes Electronic Signature(s) Signed: 12/25/2022 4:54:19 PM By: Betha Loa Signed: 12/27/2022 4:40:27 PM By: Midge Aver MSN RN CNS WTA Entered By: Betha Loa on 12/25/2022 11:23:29 -------------------------------------------------------------------------------- Multi Wound Chart Details Patient Name: Date of Service: [name] 12/25/2022 10:45 A  M Medical Record Number: [id number] Patient Account Number: [id number] Date of Birth/Sex: Treating RN: Apr 20, 1956 (67 y.o. [name] Primary Care Rosaura Bolon: [name] Other Clinician: Betha Loa Referring Francheska Villeda: Treating Dasani Crear/Extender: Wynona Dove in Treatment: 36 Vital Signs Height(in): 63 Pulse(bpm): 90 Weight(lbs): 372 Blood Pressure(mmHg): 105/73 Body Mass Index(BMI): 65.9 Temperature(F): 97.6 Respiratory Rate(breaths/min): 18 [Treatment Notes:Wound Assessments Treatment Notes] Electronic Signature(s) Signed: 12/25/2022 4:54:19 PM By: Betha Loa Entered  By: Betha Loa on 12/25/2022 11:25:39 [name] ([id number]) [id number] Page 5 of 8 -------------------------------------------------------------------------------- Multi-Disciplinary Care Plan Details Patient Name: Date of Service: [name], [name] 12/25/2022 10:45 A M Medical Record Number: [id number] Patient Account Number: [id number] Date of Birth/Sex: Treating RN: [birth year] (67 y.o. [name] Primary Care Mima Cranmore: [name] Other Clinician: Betha Loa Referring Levern Kalka: Treating Aaira Oestreicher/Extender: Wynona Dove in Treatment: 9 Active Inactive Wound/Skin Impairment Nursing Diagnoses: Impaired tissue integrity Knowledge deficit related to ulceration/compromised skin integrity Goals: Ulcer/skin breakdown will have a volume reduction of 30% by week 4 Date Initiated: 08/01/2021 Target Resolution Date: 08/29/2021 Goal Status: Active Ulcer/skin breakdown will have a volume reduction of 50% by week 8 Date Initiated: 08/01/2021 Target Resolution Date: 09/26/2021 Goal Status: Active Ulcer/skin breakdown will have a volume reduction of 80% by week 12 Date Initiated: 08/01/2021 Target Resolution Date: 10/24/2021 Goal Status: Active Ulcer/skin breakdown will heal within 14 weeks Date Initiated:  08/01/2021 Target Resolution Date: 11/07/2021 Goal Status: Active Interventions: Assess patient/caregiver ability to obtain necessary supplies Assess patient/caregiver ability to perform ulcer/skin care regimen upon admission and as needed Assess ulceration(s) every visit Provide education on ulcer and skin care Notes: Electronic Signature(s) Signed: 12/25/2022 4:54:19 PM By: Betha Loa Signed: 12/27/2022 4:40:27 PM By: Midge Aver MSN RN CNS WTA Entered By: Betha Loa on 12/25/2022 11:35:27 -------------------------------------------------------------------------------- Non-Wound Condition Assessment Details Patient Name: Date of Service: [name] 12/25/2022 10:45 A M Medical Record Number: [id number] Patient Account Number: [id number] [name], [name] ([id number]) [phone].pdf Page 6 of 8 Date of Birth/Sex: Treating RN: December 31, 1955 (67 y.o. [name] Primary Care Khair Chasteen: Other Clinician: Melany Guernsey Referring Lacey [name]: Treating Lashaunta Sicard/Extender: Wynona Dove in Treatment: 34 Non-Wound Condition: Condition: Lymphedema Location: Leg Side: Right Notes: Right leg with decreased drainage this visit. Notes Patient denies any pain or discomfort no redness, or drainage noted Electronic Signature(s) Signed: 12/25/2022 4:54:19 PM By: Betha Loa Signed: 12/27/2022 4:40:27 PM By: Midge Aver MSN RN CNS WTA Entered By: Betha Loa on 12/25/2022 11:25:24 -------------------------------------------------------------------------------- Non-Wound Condition Assessment Details Patient Name: Date of Service: [name], [name] 12/25/2022 10:45 A M Medical Record Number: [id number] Patient Account Number: [id number] Date of Birth/Sex: Treating RN: 03/31/1956 (67 y.o. [name] Primary Care Lucciano Vitali: [name] Other Clinician: Betha Loa Referring Brittinie Wherley: Treating Kendrea Cerritos/Extender: Wynona Dove in Treatment: 71 Non-Wound Condition: Condition: Lymphedema Location: Leg Side: Left Notes: decreased drainage this visit. Notes Patient denies any pain or discomfort no redness, or drainage noted Electronic Signature(s) Signed: 12/25/2022 4:54:19 PM By: Betha Loa Signed: 12/27/2022 4:40:27 PM By: Midge Aver MSN RN CNS WTA Entered By: Betha Loa on 12/25/2022 11:25:29 -------------------------------------------------------------------------------- Pain Assessment Details Patient Name: Date of Service: [name], [name] 12/25/2022 10:45 A M Medical Record Number: [id number] Patient Account Number: [id number] Date of Birth/Sex: Treating RN: 03/25/1956 (67 y.o. Larika, Tiedeman, Milfay C ([id number]) [id number] Page 7 of 8 Primary Care Cassia Fein: [name] Other Clinician: Betha Loa Referring Patryk Conant: Treating Marchele Decock/Extender: Wynona Dove in Treatment: 42 Active Problems Location of Pain Severity and Description of Pain Patient Has Paino No Site Locations Pain Management and Medication Current Pain Management: Electronic Signature(s) Signed: 12/25/2022 4:54:19 PM By: Betha Loa Signed: 12/27/2022 4:40:27 PM By: Midge Aver MSN RN CNS WTA Entered By: Betha Loa on 12/25/2022 11:19:53 -------------------------------------------------------------------------------- Patient/Caregiver Education Details Patient Name: Date of Service: [name] 6/18/2024andnbsp10:45 A M Medical  Record Number: [id number] Patient Account Number: [id number] Date of Birth/Gender: Treating RN: 07-18-1955 (67 y.o. [name] Primary Care Physician: [name] Other Clinician: Betha Loa Referring Physician: Treating Physician/Extender: Wynona Dove in Treatment: 83 Education Assessment Education Provided To: Patient Education Topics Provided Wound/Skin  Impairment: Handouts: Other: continue wound care as directed Methods: Explain/Verbal Responses: State content correctly Electronic Signature(s) Signed: 12/25/2022 4:54:19 PM By: Mia Creek ([id number]) PM By: Marylene Buerger.pdf Page 8 of 8 Signed: 12/25/2022 4:54:19 Entered By: Betha Loa on 12/25/2022 11:35:48 -------------------------------------------------------------------------------- Vitals Details Patient Name: Date of Service: [name], [name] 12/25/2022 10:45 A M Medical Record Number: [id number] Patient Account Number: [id number] Date of Birth/Sex: Treating RN: 02/13/1956 (67 y.o. [name] Primary Care Avi Archuleta: [name] Other Clinician: Betha Loa Referring Kember Boch: Treating Coco Sharpnack/Extender: Wynona Dove in Treatment: 3 Vital Signs Time Taken: 11:12 Temperature (F): 97.6 Height (in): 63 Pulse (bpm): 90 Weight (lbs): 372 Respiratory Rate (breaths/min): 18 Body Mass Index (BMI): 65.9 Blood Pressure (mmHg): 105/73 Reference Range: 80 - 120 mg / dl Electronic Signature(s) Signed: 12/25/2022 4:54:19 PM By: Betha Loa Entered By: Betha Loa on 12/25/2022 11:19:48

## 2023-01-22 ENCOUNTER — Ambulatory Visit: Payer: Medicare HMO | Admitting: Physician Assistant

## 2023-01-28 ENCOUNTER — Other Ambulatory Visit: Payer: Self-pay | Admitting: Family

## 2023-02-15 ENCOUNTER — Telehealth: Payer: Self-pay

## 2023-02-15 MED ORDER — SULFAMETHOXAZOLE-TRIMETHOPRIM 800-160 MG PO TABS: 1.0000 | ORAL_TABLET | Freq: Two times a day (BID) | ORAL | 0 refills | Status: DC

## 2023-02-15 NOTE — Telephone Encounter (Addendum)
Pt called stating: pt's kidney clinic is closed, pcp clinic told her that they will call her on Monday. Pt is calling HF clinic  bcs she thinks she has a UTI (pain urinating)*chills, warmed, drowsy, dizzy, sleeps alot   she says she can't ride in a normal vehicle she needs 3 day notice in order to get to a dr. appt  [facility] (called x[facility]) told her it will take 72 hrs to hear back from her dr.  she says she is incontinent at times , feels like she doesn't fully empty her bladder, painful when urinating    Abx were prescribed and sent to pharmacy.  Bactrim DS # 6, 1 tablet PO BID for 3 days. She needs an appt with her PCP next week though   Pt aware, agreeable, and verbalized understanding  Advised if feeling worse, having a temperature, having sob and increase pain , cp. To call EMS.

## 2023-03-05 ENCOUNTER — Telehealth: Payer: Self-pay | Admitting: Pharmacist

## 2023-03-05 ENCOUNTER — Encounter: Payer: Self-pay | Admitting: Family

## 2023-03-05 ENCOUNTER — Encounter: Payer: Medicare Other | Attending: Internal Medicine | Admitting: Internal Medicine

## 2023-03-05 ENCOUNTER — Ambulatory Visit: Payer: Medicare Other | Attending: Family | Admitting: Family

## 2023-03-05 VITALS — BP 103/64 | HR 77 | Resp 14 | Wt 355.0 lb

## 2023-03-05 DIAGNOSIS — I509 Heart failure, unspecified: Secondary | ICD-10-CM | POA: Diagnosis not present

## 2023-03-05 DIAGNOSIS — I130 Hypertensive heart and chronic kidney disease with heart failure and stage 1 through stage 4 chronic kidney disease, or unspecified chronic kidney disease: Secondary | ICD-10-CM | POA: Insufficient documentation

## 2023-03-05 DIAGNOSIS — I5032 Chronic diastolic (congestive) heart failure: Secondary | ICD-10-CM | POA: Diagnosis not present

## 2023-03-05 DIAGNOSIS — M1990 Unspecified osteoarthritis, unspecified site: Secondary | ICD-10-CM | POA: Insufficient documentation

## 2023-03-05 DIAGNOSIS — I890 Lymphedema, not elsewhere classified: Secondary | ICD-10-CM | POA: Insufficient documentation

## 2023-03-05 DIAGNOSIS — I110 Hypertensive heart disease with heart failure: Secondary | ICD-10-CM | POA: Insufficient documentation

## 2023-03-05 DIAGNOSIS — G4733 Obstructive sleep apnea (adult) (pediatric): Secondary | ICD-10-CM

## 2023-03-05 DIAGNOSIS — Q820 Hereditary lymphedema: Secondary | ICD-10-CM | POA: Insufficient documentation

## 2023-03-05 DIAGNOSIS — Z6841 Body Mass Index (BMI) 40.0 and over, adult: Secondary | ICD-10-CM | POA: Insufficient documentation

## 2023-03-05 DIAGNOSIS — N183 Chronic kidney disease, stage 3 unspecified: Secondary | ICD-10-CM | POA: Insufficient documentation

## 2023-03-05 DIAGNOSIS — G4730 Sleep apnea, unspecified: Secondary | ICD-10-CM | POA: Diagnosis not present

## 2023-03-05 DIAGNOSIS — E11622 Type 2 diabetes mellitus with other skin ulcer: Secondary | ICD-10-CM | POA: Diagnosis not present

## 2023-03-05 DIAGNOSIS — L97821 Non-pressure chronic ulcer of other part of left lower leg limited to breakdown of skin: Secondary | ICD-10-CM | POA: Insufficient documentation

## 2023-03-05 DIAGNOSIS — I10 Essential (primary) hypertension: Secondary | ICD-10-CM

## 2023-03-05 DIAGNOSIS — L97811 Non-pressure chronic ulcer of other part of right lower leg limited to breakdown of skin: Secondary | ICD-10-CM | POA: Insufficient documentation

## 2023-03-05 MED ORDER — WEGOVY 0.25 MG/0.5ML ~~LOC~~ SOAJ: 0.2500 mg | SUBCUTANEOUS | 0 refills | Status: DC

## 2023-03-05 NOTE — Progress Notes (Signed)
PCP: [facility] (last seen 08/24) Primary Cardiologist: Marcina Millard, MD (last seen 04/24; returns 10/24)  HPI:  Ms Felmlee is a 67 y/o female with a history of asthma, HTN, arthritis, lymphedema, CKD, secondary hyperparathyroidism, sleep apnea, anemia, spinal stenosis, obesity, previous tobacco use and chronic heart failure.   Admitted 05/10/22 due to worsening edema and drainage. Patient evaluated and found to have cellulitis of the right lower extremity. Had AKI. Vascular and wound consults obtained. Antibiotics started. Became symptomatic with elevated lactic acid and WBC. IVF given and antibiotic changed. CT scan 11/7 did not show any drainable fluid collection. Discharged after 6 days.   Echo 04/17/18: EF 65% with PA pressure of 43 mmHg Echo 04/13/19: EF 55-60% with mild LAE and trace MR Echo 11/01/21: EF of 60-65%.   She presents today for a HF follow-up visit with a chief complaint of minimal fatigue with moderate exertion. Has associated cough and chronic lymphedema (worsened over these last few weeks) and rare angina along with this. Denies shortness of breath, palpitations, dizziness or difficulty sleeping.   Was outside quite a bit yesterday in the heat and was sweating a lot and feels like she got overheated. She went inside her house and didn't feel well so starting drinking more fluids. This morning when she woke up, she said that she was feeling "much better".   Went to the wound center earlier today and they are concerned about a couple of areas on her right lower leg.   ROS: All systems negative except as listed in HPI, PMH and Problem List.  SH:  Social History   Socioeconomic History   Marital status: Single    Spouse name: Not on file   Number of children: Not on file   Years of education: Not on file   Highest education level: Not on file  Occupational History   Not on file  Tobacco Use   Smoking status: Former    Current packs/day: 0.00    Average  packs/day: 0.3 packs/day for 14.0 years (3.5 ttl pk-yrs)    Types: Cigarettes    Start date: 07/09/1976    Quit date: 07/09/1990    Years since quitting: 32.6   Smokeless tobacco: Current    Types: Snuff  Vaping Use   Vaping status: Never Used  Substance and Sexual Activity   Alcohol use: No   Drug use: No   Sexual activity: Not Currently  Other Topics Concern   Not on file  Social History Narrative   Not on file   Social Determinants of Health   Financial Resource Strain: Not on file  Food Insecurity: No Food Insecurity (09/06/2022)   [facility]   Hunger Vital Sign    Worried About Running Out of Food in the Last Year: Never true    Ran Out of Food in the Last Year: Never true  Transportation Needs: No Transportation Needs (09/06/2022)   [facility]   [facility] - Transportation    Lack of Transportation (Medical): No    Lack of Transportation (Non-Medical): No  Physical Activity: Not on file  Stress: Not on file  Social Connections: Not on file  Intimate Partner Violence: Not At Risk (05/10/2022)   Humiliation, Afraid, Rape, and Kick questionnaire    Fear of Current or Ex-Partner: No    Emotionally Abused: No    Physically Abused: No    Sexually Abused: No    FH:  Family History  Problem Relation Age of Onset   Breast cancer Maternal Aunt    Thyroid disease Other     Past Medical History:  Diagnosis Date   Arthritis    Asthma    CHF (congestive heart failure) (HCC)    Enlarged heart    Hypertension    Lymphedema    Sleep apnea    Spinal stenosis     Current Outpatient Medications  Medication Sig Dispense Refill   acetaminophen (TYLENOL) 500 MG tablet Take 500-1,000 mg by mouth every 6 (six) hours as needed for mild pain or fever.     albuterol (VENTOLIN HFA) 108 (90 Base) MCG/ACT inhaler Inhale 1-2 puffs into the lungs every 6 (six) hours as needed for wheezing or shortness of  breath.     bumetanide (BUMEX) 2 MG tablet Take 1 tablet (2 mg total) by mouth daily. Stop Furosemide 30 tablet 4   cetirizine (ZYRTEC) 10 MG tablet Take 1 tablet (10 mg total) by mouth daily. (Patient taking differently: Take 10 mg by mouth daily as needed for allergies.) 60 tablet 0   FARXIGA 10 MG TABS tablet Take 1 tablet (10 mg total) by mouth daily before breakfast. 30 tablet 4   metolazone (ZAROXOLYN) 5 MG tablet Take 0.5 tablets (2.5 mg total) by mouth 2 (two) times a week. (Patient taking differently: Take 2.5 mg by mouth 2 (two) times a week. Tues and sat) 12 tablet 3   mupirocin ointment (BACTROBAN) 2 % Apply 1 Application topically daily as needed (wound care).     NYSTATIN powder Apply 1 Application topically 2 (two) times daily as needed (Yeast Skin infection).     potassium chloride SA (KLOR-CON M) 20 MEQ tablet Take 2 tablets (40 mEq total) by mouth daily. 180 tablet 3   senna-docusate (SENOKOT-S) 8.6-50 MG tablet Take 1 tablet by mouth 2 (two) times daily as needed for mild constipation or moderate constipation.     spironolactone (ALDACTONE) 25 MG tablet TAKE 0.5 TABLET BY MOUTH ONCE DAILY. 15 tablet 6   sulfamethoxazole-trimethoprim (BACTRIM DS) 800-160 MG tablet Take 1 tablet by mouth 2 (two) times daily. 6 tablet 0   No current facility-administered medications for this visit.   Vitals:   03/05/23 0947  BP: 103/64  Pulse: 77  Resp: 14  SpO2: 96%  Weight: (!) 355 lb (161 kg)   Wt Readings from Last 3 Encounters:  03/05/23 (!) 355 lb (161 kg)  11/27/22 (!) 361 lb (163.7 kg)  10/08/22 (!) 345 lb 3.2 oz (156.6 kg)   Lab Results  Component Value Date   CREATININE 1.81 (H) 08/31/2022   CREATININE 1.28 (H) 08/03/2022   CREATININE 1.32 (H) 07/23/2022   PHYSICAL EXAM:  General:  Well appearing. No resp difficulty HEENT: normal Neck: supple. JVP flat. No lymphadenopathy or thryomegaly appreciated. Cor: PMI normal. Regular rate & rhythm. No rubs, gallops or  murmurs. Lungs: clear Abdomen: soft, nontender, nondistended. No hepatosplenomegaly. No bruits or masses.  Extremities: no cyanosis, clubbing, rash. + bilateral lymphedema Neuro: alert & oriented x3, cranial nerves grossly intact. Moves all 4 extremities w/o difficulty. Affect pleasant.   ECG: not done   ASSESSMENT & PLAN:  1: Chronic heart failure with preserved ejection fraction- - likely HTN/ sleep apnea - NYHA class II - euvolemic today - has attempted  to weigh daily with standing on 2 scales at home but it fluctuates too much - weight down 6 pounds from last visit here 3 months ago - Echo 04/17/18: EF  65% with PA pressure of 43 mmHg - Echo 04/13/19: EF 55-60% with mild LAE and trace MR - Echo 11/01/21: EF of 60-65%. - reviewed the importance of keeping her sodium 2000mg  as well as keeping daily fluid intake to 60-64 ounces/ day - facial/mouth swelling with ACEi - continue farxiga 10mg   - continue spironolactone 12.5mg  daily; unsure if BP could tolerate titrating this to 25mg  daily - continue bumex 2mg  QOD - continue metolazone 2.5mg  twice weekly - continue potassium daily - saw cardiology Mellissa Kohut) 04/24 - BNP 10/31/21 was 123.9  2: HTN with stage 3 CKD- - BP 103/64 - saw PCP Jesse Sans) @ [facility] ~ 2 weeks ago - saw nephrology Cherylann Ratel) 07/24  - BMP 01/08/23 reviewed and showed sodium 137, potassium 4.2, creatinine 1.59 and GFR 35  3: Lymphedema- - saw wound center 08/24 - right leg wrapped in velcro wrap; has a couple small wounds on right leg  4: Sleep apnea- - not wearing CPAP nightly due to having water in the mask; insurance will not allow another CPAP machine until 2025 - saw pulmonology Welton Flakes) 04/24  5: Morbid obesity- - BMI 11/27/22 was 61.90  - GLP1 PA was submitted by Melford Aase, PharmD today - patient is agreeable to starting this if it gets approved - unable to attend [place] pharmacy clinic due to transportation issues  Return in 6 weeks, sooner if  needed

## 2023-03-05 NOTE — Telephone Encounter (Signed)
After patient visit today with Clarisa [facility], a prior authorization was submitted on behalf of nurse practitioner [place] for [facility]. It has been approved and the patient was educated on administration via telephone as well as provided with additional resources. Patient denies personal history of thyroid cancer, however reports her sister had a thyroid nodule removed. As far as the patient knows, her sister does not have medullary thyroid cancer. Informed her to ask her sister for clarification prior to picking up [facility] as it should not be taken with a family history of medullary thyroid cancer.   Thank you for involving pharmacy in this patient's care.  Enos Fling, PharmD, BCPS Phone - [phone] Clinical Pharmacist 03/05/2023 3:12 PM

## 2023-03-06 NOTE — Progress Notes (Signed)
QUINITA, HOOK ([id number]) [id number] Page 1 of 14 Visit Report for 03/05/2023 HPI Details Patient Name: Date of Service: [name], [name] 03/05/2023 8:15 A M Medical Record Number: [id number] Patient Account Number: [id number] Date of Birth/Sex: Treating RN: 30-Oct-1955 (67 y.o. Freddy Finner Primary Care Provider: [name] Crutch Other Clinician: Betha Loa Referring Provider: Treating Provider/Extender: Chauncey Mann, MICHA EL Almyra Brace in Treatment: 78 History of Present Illness HPI Description: The patient is a 67 year old female with history of hypertension and a long-standing history of bilateral lower extremity lymphedema (first presented on 4/2) . She has had open ulcers in the past which have always responded to compression therapy. She had briefly been to a lymphedema clinic in the past which helped her at the time. this time around she stopped treatment of her lymphedema pumps approximately 2 weeks ago because of some pain in the knees and then noticed the right leg getting worse. She was seen by her PCP who put her on clindamycin 4 times a day 2 days ago. The patient has seen [facility] and Dr. Gilda Crease had seen her last year where a vascular study including venous and arterial duplex studies were within normal limits. he had recommended compression stockings and lymphedema pumps and the patient has been using this in about 2 weeks ago. She is known to be diabetic but in the past few time she's gone to her primary care doctor her hemoglobin A1c has been normal. 02/11/2015 - after her last visit she took my advice and went to the ER regarding the progressive cellulitis of her right lower extremity and she was admitted between July 17 and 22nd. She received IV antibiotics and then was sent home on a course of steroid-induced and oral antibiotics. She has improved much since then. 02/17/2015 -- she has been doing fine and the weeping of her legs  has remarkably gone down. She has no fresh issues. READMISSION 01/15/18 This patient was given this clinic before most recently in 2016 seen by Dr. Meyer Russel. She has massive bilateral lymphedema and over the last 2 months this had weeping edema out of the left leg. She has compression pumps but her compliance with these has been minimal. She has [facility] they've been using TCA/ABDs/kerlix under an Ace wrap.she has had recent problems with cellulitis. She was apparently seen in the ER and 12/23/17 and given clindamycin. She was then followed by her primary doctor and given doxycycline and Keflex. The pain seems to have settled down. In April 2018 the patient had arterial studies done at [facility]. This showed triphasic waveforms throughout the right leg and mostly triphasic waveforms on the left except for monophasic at the posterior tibial artery distally. She was not felt to have evidence of right lower extremity arterial stenosis or significant problems on the left side. 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 She also had a right lower extremity venous Doppler in January 2018 this was limited by the patient's body habitus and lymphedema. Most of the proximal veins were not visualized The patient presents with an area of denuded skin on the anterior medial part of the left calf. There is weeping edema fluid here. 01/22/18; the patient has somewhat better edema control using her compression pumps twice a day and as a result she has much better epithelialization on the left anterior calf area. Only a small open area remains. 01/29/18; the patient has been compliant with her compression pumps.  Both the areas on her calf that healed. The remaining area on the left anterior leg is fully epithelialized Readmission: 02/20/2019 upon evaluation today patient presents for reevaluation due to issues that she is having with the bilateral lower extremities. She  actually has wounds open on both legs. On the right she has an area in the crease of her leg on the right around the knee region which is actually draining quite a bit and actually has some fungal type appearance to it. She has been on nystatin powder that seems to have helped to some degree. In regard to the left lower extremity this is actually in the lower portion of her leg closer to the ankle and again is continuing to drain as well unfortunately. There does not appear to be any signs of active infection at this time which is good news. No fevers, chills, nausea, vomiting, or diarrhea. She tells me that since she was seen last year she is actually been doing quite well for the most part with regard to her lower extremities. Unfortunately she now is experiencing a little bit more drainage at this time. She is concerned about getting this under control so that it does not get significantly worse. 02/27/2019 on evaluation today patient appears to be doing somewhat better in regard to her bilateral lower extremity wounds. She has been tolerating the dressing changes without complication. Fortunately there is no signs of active infection at this point. No fevers, chills, nausea, vomiting, or diarrhea. She did get her dressing supplies which is excellent news she was extremely excited to get these. She also got paperwork from [employer] for their financial assistance program where they may be able to help her out in the future if needed with supplies at discounted prices. 03/06/2019 on evaluation today patient appears to be doing a little worse with regard to both areas of weeping on her bilateral lower extremities. This is around the right medial knee and just above the left ankle. With that being said she is unfortunately not doing as well as I would like to see. I feel like she may need to potentially go see someone at the lymphedema clinic as the wraps that she needs or even beyond what we can do here at  the wound care center. She really does not have wounds she just has open areas of weeping that are causing some difficulty for her. Subsequently because of this and the moisture I am concerned about the potential for infection I am going to likely give her a prophylactic antibiotic today, Keflex, just to be on the safe side. Nonetheless again there is no obvious signs of active infection at this time. 03/13/2019 on evaluation today patient appears to be doing well with regard to her bilateral lower extremities where she has been weeping compared to even last AIREN, GOLDRICK ([id number]) [id number] Page 2 of 14 week's evaluation. I see some areas of new skin growth which is excellent and overall I am very pleased with how things seem to be progressing. No fevers, chills, nausea, vomiting, or diarrhea. 03/20/2019 on evaluation today patient unfortunately is continuing to have issues with significant edema of the left lower extremity. Her right side seems to be doing much better. Unfortunately her left side is showing increased weeping of the lower portion of her leg. This is quite unfortunate obviously we were hoping to get her into the lymphedema clinic they really do not seem to when I see her how if she is draining.  Despite the fact this is really not wound related but more lymphedema weeping related. Nonetheless I do not know that this can be helpful for her to even go for that appointment since again I am not sure there is much that they would actually do at this point. We may need to try a 4 layer compression wrap as best we can on her leg. She is on the Augmentin currently although I am still concerned about whether or not there could be potentially something going on infection wise I would obtain a culture though I understand is not the best being that is a surface culture I just 1 to make sure I do not seem to be missing anything. 03/27/2019 on evaluation today patient  appears to be doing much better in regard to the left lower extremity compared to last week. Last week she had tremendous weeping which I think was subsequent to infection now she seems to be doing much better and very pleased. This is not completely healed but there is a lot of new skin growth and it has dried out quite a bit. Overall I think that we are doing well with how things are moving along at this time. No fevers, chills, nausea, vomiting, or diarrhea. 04/03/2019 evaluation today patient appears to be doing a little worse this week compared to last time I saw her. I think this may be due to the fact that she is having issues with not being able to sleep in her bed at least not until last night. She is therefore been in a lift chair and subsequently has also had issues with not been able to use her pumps since she could not get in bed. With that being said the patient overall seems to be doing okay I do think I may want extend the antibiotic for a little bit longer at least until we can see if her edema and her weeping gets better and if it is then obviously I can always discontinue the antibiotics as of next week however I want her to continue to have it over the next week. 04/10/2019 on evaluation today patient unfortunately is still doing poorly with regard to her left lower extremity. Her right is all things considering doing fairly well. On the left however she continues to have spreading of the area of infection and weeping which appears to be even a larger surface area than noted last week. She did have a positive culture for Pseudomonas in particular which seems to have been of concern she still has green/yellow discharge consistent with Pseudomonas and subsequently a tremendous amount of it. This has me obviously still concerned about the infection not really clearing up despite the fact that on culture it appears the Cipro should have been a good option for treating this. I think she may  at this point need IV antibiotics since things are not doing better I do not want to get worse and cause sepsis. She is in agreement with the plan and believes as well that she likely does need to go to the hospital for IV vancomycin. Or something of the like depending on what the recommendation is from the ER. 04/17/2019 on evaluation today patient appears to be doing excellent in regard to her lower extremity on the left. She was in the hospital for several days from when I sent her last we saw her until just this past Tuesday. Fortunately her drainage is significantly improved and in fact is mostly clear. There  is just a couple small areas that may still drain a little bit she states that the [facility] they prescribed for her at discharge she went picked up from pharmacy and got home but has not been able to find it since. She is looked everywhere. She is wondering if I will replace that for her today I will be more than happy to do that. 05/01/2019 on evaluation today patient actually appears to be doing quite well with regard to her lower extremities. She occasionally is having areas that will leak and then heal up mainly when a piece of the fibrotic skin pops off but fortunately she is not having any signs of active infection at this time. Overall she also really does not have any obvious weeping at this time. I do believe however she really needs some compression wraps and I think this may be a good time to get her back to the lymphedema clinic. 05/11/2019 on evaluation today patient actually appears to be doing quite well with regard to her bilateral lower extremities. She occasionally will have a small area that we per another but in general seems to be completely healed which is great news. Overall very pleased with how everything seems to be progressing. She does have her appointment with lymphedema clinic on November 18. 05/25/2019 on evaluation today patient appears to be doing well with  regard to her left lower extremity. I am very pleased in this regard. In regard to her right leg this actually did start draining more I think it is mainly due to the fact that her leg is more swollen. I am not seeing any obvious signs of infection at this time although that is definitely something were obviously acutely aware of simply due to the fact that she had an issue not too far back with exactly this issue. Nonetheless I do feel like that lymphedema clinic would still be beneficial for her. I explained obviously if they are not able to do anything treatment wise on the right leg we could at least have them treat her left leg and then proceed from there. The patient is really in agreement with that plan. If they are able to do both as the drainage slows down that I would be happy to let them handle both. 06/01/2019 on evaluation today patient unfortunately appears to be doing worse with regard to her right lower extremity. The left lower extremity is still maintaining at this point. Unfortunately she has been having significantly increased pain over the past several days and has been experiencing as well increased swelling of the right lower extremity. I really do not know that I am seeing anything that appears to be obvious for infection at this point to be peripherally honest. With that being said the patient does seem to be having much more swelling that she is even experienced in the past and coupled with increased pain in her hip as well I am concerned that again she could potentially have a DVT although I am not 100% sure of this. I think it something that may need to be checked out. We discussed the possibility of sending her for a DVT study through the hospital but unfortunately transportation is an issue if she does have a DVT I do not want her to wait days to be able to get in for that test however if she has this scheduled as an outpatient that is as fast that she will be able to get  the test scheduled for  transportation purposes. That will also fall on Thanksgiving so subsequently she did actually be looking at either Friday or even next week before we would know anything back from this. That is much too long in my opinion. Subsequent to the amount of discomfort she is experiencing the patient is actually okay with going to the ER for evaluation today. 06/12/2019 on evaluation today patient actually appears to be doing significantly better compared to last time I saw her. Following when I last saw her she was actually in the hospital from that Monday until the following Sunday almost 1 full week. She actually was placed on Keflex in the hospital following the time for her to be discharged and Dr. Joylene Draft has recommended 2 times a day dosing of the Keflex for the next year in order to help with more prophylactic/preventative measures with regard to her developing cellulitis. Overall I think this sounds like an excellent plan. The patient unfortunately is good to have trouble being treated at lymphedema clinic due to the fact that she really cannot get up on the bed that they have there. They also state that they cannot manage her as long as she has anything draining at this point. Obviously that is somewhat unfortunate as she does need help with edema control but nonetheless we will have to do what we can for her outside of it sounds like the lymphedema clinic scenario at this point. 06/19/2019 on evaluation today patient appears to be doing fairly well with regard to her bilateral lower extremities. She is not nearly as swollen and shows no signs of infection at this point. There is no evidence of cellulitis whatsoever. She also has no open wounds or draining at this point which is also good news. No fever chills noted. She seems to be in very good spirits and in fact appears to be doing quite well. READMISSION 11/27/2019 This is a 67 year old woman that we have had in this  clinic several times before including 2015, 16 and 19 and then most recently from 03/20/2019 through 06/19/2019 with bilateral lower extremity lymphedema. She has had previous arterial and reflux studies done years ago which were not all that remarkable. 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 She does have a positive family history and she had large legs starting may be in her 39s. She was recently in hospital from 10/20/2019 through 10/28/2019 with right leg cellulitis. She was given Ancef and clindamycin and then Zosyn when a culture showed Pseudomonas. At that time there was purulent drainage. She was followed by infectious disease Dr Joylene Draft. The patient is now back at home. She has noted increased swelling in the right and no drainage in her right leg mostly on the posterior medial aspect in the calf area. She has not had pain or fever. She has literally been improved lysing above dressings because her at the area of this is far too large for standard compression. She has been wrapping the areas with sheets to resorptive pads. She is found these helped somewhat. She does have an appointment with the lymphedema clinic in [place] in late June. Past medical history includes bilateral lymphedema, hypertension, obstructive sleep apnea with CPAP. Recent hospitalization with apparently Pseudomonas cellulitis of the right lower leg 12/15/2019 upon evaluation today patient appears to be doing a little bit worse in regard to her right lower extremity. Unfortunately she is having more weeping KAYLIEE, SKURKA ([id number]) [id number] Page 3 of 14  down in the lower portion of her leg. Fortunately there is no signs of active infection at this time. No fever chills noted. The patient states she is not having increased pain except for when she attempted to use the lymphedema pumps unfortunately she states that she did have  pain when she did this. Otherwise we been using absorptive dressings of one type or another she is using diapers at home and then subsequently Ace wraps. In regard to the barrier cream we have discussed the possibility of derma cloud which she would like to try I do not have a problem with that. 12/22/2019 upon evaluation today patient actually appears to be doing better in regard to her leg ulcers at this point. Fortunately there does not appear to be any signs of active infection which is great news and I am extremely pleased with where things are progressing at this time. There is no sign of active infection currently. The patient is very pleased to see things doing so well. 12/29/2019 upon evaluation today patient appears to be doing a little bit better in regard to her weeping in general over her lower extremities. She does have some signs of mild erythema little bit more than what I noted last week or rather last visit. Nonetheless I think that my threshold for switching her antibiotics from Keflex to something else is very low at this point considering that she has had such severe infections in the past that seem to come almost out of nowhere. There is a little erythema and warmth noted of the lower portion of her leg compared to the upper which also makes me want to go ahead and address things more rapidly at this point. Likely I would switch out the Keflex for something like Levaquin ideally. 7/16; patient with severe bilateral lymphedema. She has superficial wounds albeit almost circumferential now on the left lateral lower leg. This may be new from last time. Small area on the right anterior lower leg and then another area on the right medial lower leg and of pannus fold. She has been using various absorptive garments. She states she is using her compression pumps once a day occasionally twice. Culture from her last visit here was negative 01/29/2020 on evaluation today patient appears to be  doing excellent at this point in regard to her legs with regard to infection I see no signs of active infection at this point. She still does have unfortunately areas of weeping this is minimal on the right now her left is actually significantly worse although I do not think it is as bad as last week with Dr. Leanord Hawking saw her. She has been trying to pump and elevate her legs is much as possible. She has previously been on the Keflex and in the past for prevention that seems to do fairly well and likely can extend that today. 02/04/2020 on evaluation today patient appears to be doing better in regard to her legs bilaterally. Fortunately there is no signs of active infection at this time which is great news and overall she has less weeping on the left compared to the right and there is several spots where she is pretty much sealed up with no draining regions. Overall very pleased in this regard. 02/19/2020 on evaluation today patient appears to be doing very well in regard to her wounds currently. Fortunately there is no evidence of active infection overall very pleased with where things stand. She is significantly improved in regard to her edema I  am extremely pleased in this regard she tells me that the popping no longer hurts and in fact she actually looks forward to it. 03/04/2020 on evaluation today patient appears to be doing excellent in regard to her lower extremities. Fortunately there is no signs of active infection at this time. No fevers, chills, nausea, vomiting, or diarrhea. 03/25/2020 on evaluation today patient appears to be doing a little bit more poorly in regard to her legs at this point. She tells me that she is still continue to have issues with drainage and this has been a little bit worse she was getting ready to start taking the Keflex again but wanted to see me first. Fortunately there is no signs of active infection at this time. No fevers, chills, nausea, vomiting, or  diarrhea. 04/15/2020 upon evaluation today patient appears to be doing somewhat poorly in regard to her right leg. She tells me she has been having more pain she has been taking the Keflex that was previously prescribed unfortunately that just does not seem to help with this. She was hoping that the pain on her right was actually coming from the fact that she was having issues with her wrap having gotten caught in her recliner. With that being said she tells me that she knew something was not right. Currently her right leg is warm to touch along with being erythematous all the way up to around at least mid thigh as far as I can see. The left leg does not appear to be doing that badly though there is increased weeping around the ankle region. 05/06/2020 on evaluation today patient appears to be doing much better than last time I saw her. She did go to the hospital where she was admitted for 2 days and treated with antibiotic therapy. She was discharged with antibiotics as well and has done extremely well. I am extremely pleased with where things stand today. There is no signs of active infection at this time which is great news. 05/27/2020 upon evaluation today patient appears to be doing well with regard to her lower extremities bilaterally. She has just a very tiny area on the right leg which is opening on the left leg she is significantly improved though she still has several areas that do appear to be open this is minimal compared to what is been in the past. In general I am extremely pleased with where things stand today. The patient does tell me she is not been using her pumps quite as much as she should be. I do believe that is 1 area she can definitely work on. She has had a lot going on including a Covid exposure and apparently also a outbreak of likely shingles. 06/24/2020 upon evaluation today patient appears to be doing well with regard to her legs in general although the left leg  unfortunately is showing some signs of erythema she does have a little bit of increased weeping and to be honest I am concerned about infection here. I discussed that with her today and I think that we may need to address this sooner rather than later she has been taking Keflex she is not really certain that is been making a big improvement however. No fevers, chills, nausea, vomiting, or diarrhea. 06/30/2020 upon evaluation today patient's legs actually seem to be doing better in my opinion as compared to where they were last week. Fortunately there does not appear to be any signs of active infection. Her culture showed multiple organisms nothing  predominate. With that being said the Levaquin seems to have done well I think she has improved since I last saw her as well. 07/14/2020 upon evaluation today patient appears to be doing actually better in regard to her lower extremities in my opinion. She has been tolerating the dressing changes without complication. Fortunately there is no signs of active infection at this time. 08/04/2020 upon evaluation today patient appears to be doing excellent in regard to her leg ulcers. Fortunately she has very little that is open at this point. This is great news. In fact I think that the Goldbond medicated powder has been excellent for her. It seems to have done the trick where we had tried several other things without as much success. Fortunately there is no evidence of active infection at this time. No fevers, chills, nausea, vomiting, or diarrhea. 09/01/2020 upon evaluation today patient appears to be doing a little bit more poorly than the last time I saw her. She tells me right now that she has been having a lot of drainage compared to where things were previous which has unfortunately led to more irritation as well. She is concerned that this is leading to infection. Again based on what I am seeing today as well I am also concerned of the same to be  honest. 09/15/2020 upon evaluation today patient appears to be doing well with regard to her legs compared to what I saw previous. Fortunately there does not appear to be any evidence of active infection at this time which is great news. At least not as badly as what it was previous. With that being said I do believe that the patient does need to have an extension of the antibiotics. This I believe will actually help her more in the way of making sure this stays under control and does not worsen. That was the Bactrim DS. Readmission: 08/01/2021 upon evaluation today patient appears to be doing actually pretty well all things considered. Is actually been almost a year since have seen her last March. Fortunately I do not see any evidence of active infection locally nor systemically at this point. She does have a couple open areas on the left leg the right leg appears to be doing quite well. In general she has been in the hospital 3 times since I saw her a year ago all 4 cellulitis type issues except for the last 1 which was actually more related to congestive heart failure for that reason she is not using her lymphedema pumps at this point and that is probably the best idea. 08/08/2021 upon evaluation today patient appears to be doing well with regard to her legs all things considered her left leg may be a little bit more swollen based on what I see today. Fortunately I do not see any signs of active infection locally nor systemically at this time which is great news. No fevers, chills, nausea, vomiting, or diarrhea. 08/15/2021 upon evaluation today patient actually appears to be doing excellent in regard to her wounds. She has been tolerating the dressing changes without CASUNDRA, WATERFORD ([id number]) [id number] Page 4 of 14 complication. Fortunately I see no evidence of infection currently which is great news. No fevers, chills, nausea, vomiting, or diarrhea. 08/29/2021 upon  evaluation patient appears to be doing excellent she in fact is almost completely healed. I am actually very pleased with where we stand today and I think that she is making wonderful progress she just has a small area of weeping on  the right lateral leg everything else is pretty much completely healed which is also. 09/05/2021 upon evaluation today patient appears to be doing well with regard to her legs. She does have a small area of weeping on the right leg and there is a small area on the left leg as well. It is potentially something that may need to be addressed in the future more specifically but right now I think that there may have just been a little drainage here on the left. With all that being said I think that this still does not appear to be infected and overall I feel like she is doing quite well. That something we always have to keep a very close eye on as she can change very rapidly from okay to not okay. 09/12/2021 upon evaluation today patient appears to be doing a little bit worse in regard to her leg and actually somewhat concerned about the possibility of infection here. I discussed that with the patient. For that reason I am going to go ahead and see about getting her set up for a repeat prescription for the Bactrim. She is in agreement with that plan. 3/14; patient presents for follow-up. She has been using silver alginate with dressing changes. She is still taking Bactrim prescribed at last clinic visit. She has no issues or complaints today. She has lymphedema pumps however does not use these. 09/26/2020 upon evaluation today patient appears to actually be doing well in regard to her right leg I am pleased in that regard. Unfortunately she has new areas on her left leg that are open at this point that I do need to be addressed. I am also concerned about the warmth around one of the wounds on the more anterior side. I think this is something that we may need to culture today  potentially requiring antibiotics going forward. 10/03/2021 upon evaluation today patient unfortunately is not doing nearly as well as she was even last week with her wounds. She is having some issues here with increased cellulitis of the left lower extremity unfortunately. I did prescribe Augmentin for her eczema prescribed last week unfortunately she never actually received this prescription. The pharmacy stated they never got it. We double checked with them today they still did not receive it. I am not sure what happened in that regard. With that being said the patient is in good spirits today she does not appear to be as sick as where she normally is when the cellulitis gets significantly worse as it has in the past. Nonetheless the leg is much more red not just around the ankle where I was seeing at last week on Tuesday but rather now this is going all the way up to almost her knee and is definitely hot to touch compared to the right leg. Nonetheless I feel like she does need to go to the ER for likely IV antibiotics. 10-10-2021 upon evaluation today patient appears to be doing well with regard to her wound. She has been tolerating the dressing changes that appears to be doing much better. After I saw her last week she did go to the ER they admitted her to the hospital and gave her IV antibiotics she fortunately is doing significantly better. Overall I am extremely pleased with where things stand today. 10-17-2021 upon evaluation today patient appears to be doing well with regard to her wound. In fact this is looking better and her leg is looking better but she still does have some areas  here of erythema. We will continue to address this as soon as possible in my opinion. I do believe she may require some debridement and what appears to be a more defined wound on her left leg at this point. 10-24-2021 upon evaluation patient is definitely showing signs of improvement which is great news. I do not see  any evidence of active infection locally or systemically which is great news as well. No fevers, chills, nausea, vomiting, or diarrhea. 10-31-2021. Upon evaluation today patient's leg actually feels better from an infection and wound care perspective. I am actually very pleased in this regard. Unfortunately the biggest issue that I see at this time is that the patient is having a significant issue with her breathing. I did put her on the pulse ox machine to check her oxygen saturation and it was pretty much at 100% which is good but she tells me that when she is walking this is much more difficult for her. She also tells me that when she is trying to bend over to perform her dressing changes she is so short of breath due to the swelling and fluid in her abdominal area that she is just not been able to do this. She is tearful and very concerned today to be honest. I am truly sorry to see her this way I know she is really having a hard time at the moment. 11-14-2021 upon evaluation patient actually appears to be doing significantly better compared to when I last saw her. She was actually admitted to the hospital I believe this for 6 days total after I sent her on 10-31-2021. Subsequently they actually pulled off greater than 50 pounds of fluid she tells me she feels significantly better her legs are not as tight she actually has some play in the tissue and it is obvious that she is doing much better just from a mobility standpoint as well as a breathing standpoint. Overall I am extremely happy for her and how she is doing the leg also looks much better on the left. 11-21-2021 upon evaluation today patient appears to be doing well although it does appear that she is going require some sharp debridement the wound is very dry this is the opposite of what its been she was having so much weeping that it was staying extremely wet. Nonetheless we do need to see about going ahead and debriding the wound  today. 11-28-2021 upon evaluation today patient appears to be doing well with regard to her wound I definitely see signs of things continuing to improve which is great news. I do not see any evidence of active infection locally or systemically also great news. 12-05-2021 upon evaluation today patient appears to be doing well with regard to her wound. She has been tolerating the dressing changes. Fortunately there does not appear to be any signs of active infection locally or systemically at this time. No fevers, chills, nausea, vomiting, or diarrhea. 12-12-2021 upon evaluation patient appears to be doing well with regard to her wound this is actually measuring smaller and looking much better. Fortunately I do not see any signs of active infection locally or systemically at this time which is excellent news. 6/13; small wound area in the folds of her lymphedema just above the left ankle. Fortunately the area looks quite good. She has been using silver alginate ABDs and an Ace wrap which she is able to change her self. She is not using her compression pumps out of fear that this could  contribute to heart failure. 12-26-2021 upon evaluation patient's wounds are actually showing signs of excellent improvement. I am very pleased with where things stand. Overall I think that she has been making excellent progress and I think we are very close to complete resolution which is great news. 01-11-2022 upon evaluation today patient appears to be doing well currently in regard to her legs in general although on the left leg she does have 1 area that still irritated and inflamed on the anterior portion of her leg. Fortunately I do not see any signs of systemic infection locally there might be some infection going on here however which is my biggest concern. Fortunately I do not see any evidence though of this spreading to any other location. 01-16-2022 upon evaluation today patient has actually showing signs of excellent  improvement. Fortunately I do not see any evidence of infection locally or systemically which is great news and overall I am very pleased with where we stand at this point. 01-25-2022 upon evaluation today patient actually appears to be doing decently well in regard to her legs. She has been tolerating the dressing changes without complication. Fortunately there does not appear to be any evidence of active infection locally or systemically which is great news and overall I am extremely pleased with where we stand at this point. 02-13-2022 upon evaluation today patient appears to be doing well currently in regard to her wound which is actually showing signs of significant improvement. Fortunately I do not see any evidence of active infection locally or systemically at this time. No fevers, chills, nausea, vomiting, or diarrhea. 02-20-2022 upon evaluation today patient appears to be doing well currently in regard to her wound. She has been tolerating the dressing changes without complication. Fortunately there does not appear to be any signs of active infection locally or systemically at this time. No fevers, chills, nausea, vomiting, or diarrhea. OUMY, EUDY ([id number]) [id number] Page 5 of 14 02-27-2022 upon evaluation today patient appears to be doing excellent in regard to her wounds on the leg. She has been tolerating the dressing changes without complication this is very close to complete resolution. Fortunately I do not see any signs of infection locally or systemically at this time which is great news. No fevers, chills, nausea, vomiting, or diarrhea. 03-06-2022 upon evaluation today patient appears to be doing well currently in regard to her wound in general. She has been tolerating the dressing changes without complication. Fortunately I do not see any evidence of active infection locally or systemically at this time which is great news. I am concerned a little bit  however about not really her wounds but the second toe right foot where there appears to be some cellulitis after she dropped a frying pan on this about 2 weeks ago. She tells me its been itching and burning and I do think that it is possible she may have an infection based on what we are seeing currently. 03-20-2022 upon evaluation today patient actually appears to be doing much better at this point. Fortunately there does not appear to be any signs of active infection locally or systemically at this time which is great news. No fevers, chills, nausea, vomiting, or diarrhea. 03-27-2022 upon evaluation today patient appears to be doing somewhat poorly in regard to her legs compared to previous. Her right leg has a couple areas that are open and she is very warm and painful to touch around the lateral portion of the ankle area. Left leg is  showing signs of little bit more drainage and weeping as well I think that this is probably due to the fact that she is swelling a lot more that she has been in the past. Fortunately I do not see any signs of active infection systemically though locally there is definitely some issues here. 04-03-2022 upon evaluation today patient appears to be doing poorly in regard to her wounds on the legs unfortunately. She also seems to have more significant cellulitis that has ensued. Unfortunately she has been on doxycycline initially it seemed to be helping but as of this week that is no longer the case. It started getting worse last week and with the reinitiation of the doxycycline nothing has improved. Often when she gets like this IV antibiotics are necessary. 04-19-2022 upon evaluation today patient appears to be doing well currently in regard to her legs from an infection standpoint. She did get IV vancomycin in the hospital and states that her legs did get better when she left she was no longer weeping but she had been in the bed for 4 days straight. With that being said  since coming home her legs have begun to weep again the original wound that we were dealing with has healed she does have some weeping over both lower extremities however which is still of concern. 05-03-2022 upon evaluation today patient appears to be doing well currently in regard to her left leg which I feel like is a little bit better unfortunately the right leg is still draining significantly. Fortunately I do not see any signs of infection locally or systemically at this time. No fevers, chills, nausea, vomiting, or diarrhea. 05-10-2022 unfortunately patient actually showed signs of increasing erythema today. I believe that the cellulitis never completely resolved from when she was in the hospital last. Again she has been on doxycycline previously by myself I gave her Keflex more recently as an ongoing prescription to try to prevent infection but again I do not think this is strong enough to be able to get this under control. I really feel like that she is probably can need IV antibiotics and a good period of time in the hospital to be able to keep her legs elevated and not having to get around to do a whole lot in order to allow this to resolve. The patient voiced understanding. 05-24-2022 upon evaluation today patient appears to be doing excellent in regard to her legs. She has been tolerating the dressing changes without complication. Fortunately there does not appear to be any signs of infection at this time which is great news she has been on cefadroxil since she was discharged from the hospital. The day that we sent her she actually tells me that she ended up in septic shock that day and coded. When she first showed up they did not notice it on the lab work but said by the end of the day she had already undergone this and they had had to address the issue obviously when she coded. She tells me that likely she would not be here today if she had not gone to the hospital that day. I explained  to her that I do believe that was a God thing and it was just the perfect timing that he had for her at that time. 06-14-2022 upon evaluation today patient appears to be doing well currently in regard to her legs. In general I think she is making good progress and I do not see any signs of  infection at this time which is great news. No fevers, chills, nausea, vomiting, or diarrhea. 06-28-2022 upon evaluation today patient appears to be doing excellent in regard to her legs. She is actually showing signs of excellent improvement I do not see any evidence of infection locally nor systemically at this time. No fevers, chills, nausea, vomiting, or diarrhea. 07-26-2022 upon eval evaluation today patient appears to be doing well currently in regard to her wound. She has been tolerating the dressing changes without complication. Fortunately I do not see any major issues here she has a couple areas of weeping mainly on the right leg the left leg seems to be doing pretty well. 08-23-2021 upon evaluation today patient appears to be doing excellent in regard to her legs. These are showing signs of wonderful improvement I am actually very pleased with where we stand I think she is doing the best that I have ever seen her. Fortunately I do not see any evidence of infection locally or systemically which is great news. 09-27-2022 upon evaluation today patient appears to be doing well she has no open wounds at this time her lymphedema seems to be doing quite well at this point. Fortunately I do not see any signs of active infection locally nor systemically which is great news and overall her swelling though up a little bit is not nearly as bad as it has been at times. No evidence of cellulitis currently. 10-25-2022 upon evaluation patient appears to be doing excellent in regard to her wounds currently. I do not see anything open that this is great her leg still has the bilateral lymphedema but again this is not nearly as  severe as what it has been in the past I think she is doing really well. She is using the pumps when she can although she does tell me there is been some issue there with her arthritis at times not being able to do what she needs to do. 11-27-2022 upon evaluation today patient appears to be doing well currently in regard to her legs which do not show any signs of opening she still has significant lymphedema bilaterally. Fortunately I do not see any evidence of active infection locally nor systemically which is great news. 12-25-2022 upon evaluation today patient appears to be doing excellent in fact she is showing signs of her legs still doing great with no openings and overall very pleased in that regard. She has been using her lymphedema wraps that she purchased and I do seem to be helping her she tells me. Extremely pleased with this. 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 She has severe bilateral lymphedema with marked skin changes related to this. She has compression pumps at home but tells me she does not use them lately because of hip and lower back issues difficulty getting into bed etc. She was also supposed to be Ace wrapping her lower leg she does not do this on the right leg she has a special type of wrap that she uses from her mid calf up to her knee. She did not have any obvious open wounds but are intake nurse did notice the weeping lower area at on the right just above her ankle Electronic Signature(s) Signed: 03/05/2023 5:06:52 PM By: Baltazar Najjar MD Entered By: Baltazar Najjar on 03/05/2023 08:53:47 [name] ([id number]) [id number] Page 6 of 14 -------------------------------------------------------------------------------- Physical Exam Details Patient Name: Date of Service: RUSSELL, TEANEY 03/05/2023 8:15 A M Medical  Record Number: [id number] Patient Account Number: [id number] Date of Birth/Sex:  Treating RN: Dec 23, 1955 (67 y.o. Freddy Finner Primary Care Provider: [name] Crutch Other Clinician: Betha Loa Referring Provider: Treating Provider/Extender: Chauncey Mann, MICHA EL Almyra Brace in Treatment: 63 Constitutional Sitting or standing Blood Pressure is within target range for patient.. Pulse regular and within target range for patient.Marland Kitchen Respirations regular, non-labored and within target range.. Temperature is normal and within the target range for the patient.Marland Kitchen appears in no distress. Notes Wound exam; bilateral lymphedema with thickened fissured lymphedema skin. On the left she has polyps secondary to lymphedema but none of these look ominous. On the right she does have a weeping open area just above the right ankle but no frank wounds are seen. Dorsalis pedis pulses are palpable bilaterally. The concerning area is on the right where the lymphedema is tense from the mid tibia down. There is some developing erythema. All of this seems worse than on the left Electronic Signature(s) Signed: 03/05/2023 5:06:52 PM By: Baltazar Najjar MD Entered By: Baltazar Najjar on 03/05/2023 08:55:09 -------------------------------------------------------------------------------- Physician Orders Details Patient Name: Date of Service: AYSE, SPROUSE 03/05/2023 8:15 A M Medical Record Number: [id number] Patient Account Number: [id number] Date of Birth/Sex: Treating RN: 03/14/1956 (67 y.o. Freddy Finner Primary Care Provider: [name] Crutch Other Clinician: Betha Loa Referring Provider: Treating Provider/Extender: Chauncey Mann, MICHA EL Almyra Brace in Treatment: 64 Verbal / Phone Orders: Yes Clinician: Yevonne Pax Read Back and Verified: Yes Diagnosis Coding Follow-up Appointments Return Appointment in 1 month Nurse Visit as needed Bathing/ Shower/ Hygiene Clean wound with Normal Saline or wound cleanser. Wash wounds with antibacterial soap and  water. May shower; gently cleanse wound with antibacterial soap, rinse and pat dry prior to dressing wounds No tub bath. Edema Control - Lymphedema / Segmental Compressive Device / Other Elevate, Exercise Daily and A void Standing for Long Periods of Time. Elevate leg(s) parallel to the floor when sitting. DO YOUR BEST to sleep in the bed at night. DO NOT sleep in your recliner. Long hours of sitting in a recliner leads to swelling of the legs and/or potential wounds on your backside. Other: - use lymphedema pumps at least twice daily RAISHA, BERNARDIN ([id number]) [id number] Page 7 of 14 wrap RLE from knee down with ace wrap use velcro wrap to upper calf area Non-Wound Condition Bilateral Lower Extremities Cleanse affected area with antibacterial soap and water, Medications-Please add to medication list. ntibiotic - apply Mupirocin to weepy areas as needed Topical A Electronic Signature(s) Signed: 03/05/2023 5:06:52 PM By: Baltazar Najjar MD Signed: 03/06/2023 4:41:54 PM By: Betha Loa Entered By: Betha Loa on 03/05/2023 08:41:43 -------------------------------------------------------------------------------- Problem List Details Patient Name: Date of Service: Emmit Pomfret 03/05/2023 8:15 A M Medical Record Number: [id number] Patient Account Number: [id number] Date of Birth/Sex: Treating RN: 1956/02/22 (67 y.o. 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 08/01/2021 No Yes of skin L97.821 Non-pressure chronic ulcer of other part of left lower leg limited to breakdown 08/01/2021 No  Yes of skin Inactive Problems Resolved Problems Electronic Signature(s) Signed: 03/05/2023 5:06:52 PM By: Baltazar Najjar MD Entered By: Baltazar Najjar on 03/05/2023 08:51:47 Giannelli,  RAIME DEHM ([id number]) [id number] Page 8 of 14 -------------------------------------------------------------------------------- Progress Note Details Patient Name: Date of Service: ALAXANDRA, FRIELING 03/05/2023 8:15 A M Medical Record Number: [id number] Patient Account Number: [id number] Date of Birth/Sex: Treating RN: 27-Sep-1955 (67 y.o. Freddy Finner Primary Care Provider: [name] Crutch Other Clinician: Betha Loa Referring Provider: Treating Provider/Extender: Chauncey Mann, MICHA EL Almyra Brace in Treatment: 26 Subjective History of Present Illness (HPI) The patient is a 67 year old female with history of hypertension and a long-standing history of bilateral lower extremity lymphedema (first presented on 4/2) . She has had open ulcers in the past which have always responded to compression therapy. She had briefly been to a lymphedema clinic in the past which helped her at the time. this time around she stopped treatment of her lymphedema pumps approximately 2 weeks ago because of some pain in the knees and then noticed the right leg getting worse. She was seen by her PCP who put her on clindamycin 4 times a day 2 days ago. The patient has seen [facility] and Dr. Gilda Crease had seen her last year where a vascular study including venous and arterial duplex studies were within normal limits. he had recommended compression stockings and lymphedema pumps and the patient has been using this in about 2 weeks ago. She is known to be diabetic but in the past few time she's gone to her primary care doctor her hemoglobin A1c has been normal. 02/11/2015 - after her last visit she took my advice and went to the ER regarding the progressive cellulitis of her right lower extremity and she  was admitted between July 17 and 22nd. She received IV antibiotics and then was sent home on a course of steroid-induced and oral antibiotics. She has improved much since then. 02/17/2015 -- she has been doing fine and the weeping of her legs has remarkably gone down. She has no fresh issues. READMISSION 01/15/18 This patient was given this clinic before most recently in 2016 seen by Dr. Meyer Russel. She has massive bilateral lymphedema and over the last 2 months this had weeping edema out of the left leg. She has compression pumps but her compliance with these has been minimal. She has [facility] they've been using TCA/ABDs/kerlix under an Ace wrap.she has had recent problems with cellulitis. She was apparently seen in the ER and 12/23/17 and given clindamycin. She was then followed by her primary doctor and given doxycycline and Keflex. The pain seems to have settled down. In April 2018 the patient had arterial studies done at [facility]. This showed triphasic waveforms throughout the right leg and mostly triphasic waveforms on the left except for monophasic at the posterior tibial artery distally. She was not felt to have evidence of right lower extremity arterial stenosis or significant problems on the left side. 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 She also had a right lower extremity venous Doppler in January 2018 this was limited by the patient's body habitus and lymphedema. Most of the proximal veins were not visualized The patient presents with an area of denuded skin on the anterior medial part of the left calf. There is weeping edema fluid here. 01/22/18; the patient has somewhat better edema control using her compression pumps twice a day and as a result she has much better epithelialization on the left anterior calf area. Only a small open area remains. 01/29/18; the patient has been compliant with her compression pumps. Both the areas  on  her calf that healed. The remaining area on the left anterior leg is fully epithelialized Readmission: 02/20/2019 upon evaluation today patient presents for reevaluation due to issues that she is having with the bilateral lower extremities. She actually has wounds open on both legs. On the right she has an area in the crease of her leg on the right around the knee region which is actually draining quite a bit and actually has some fungal type appearance to it. She has been on nystatin powder that seems to have helped to some degree. In regard to the left lower extremity this is actually in the lower portion of her leg closer to the ankle and again is continuing to drain as well unfortunately. There does not appear to be any signs of active infection at this time which is good news. No fevers, chills, nausea, vomiting, or diarrhea. She tells me that since she was seen last year she is actually been doing quite well for the most part with regard to her lower extremities. Unfortunately she now is experiencing a little bit more drainage at this time. She is concerned about getting this under control so that it does not get significantly worse. 02/27/2019 on evaluation today patient appears to be doing somewhat better in regard to her bilateral lower extremity wounds. She has been tolerating the dressing changes without complication. Fortunately there is no signs of active infection at this point. No fevers, chills, nausea, vomiting, or diarrhea. She did get her dressing supplies which is excellent news she was extremely excited to get these. She also got paperwork from [employer] for their financial assistance program where they may be able to help her out in the future if needed with supplies at discounted prices. 03/06/2019 on evaluation today patient appears to be doing a little worse with regard to both areas of weeping on her bilateral lower extremities. This is around the right medial knee and just above  the left ankle. With that being said she is unfortunately not doing as well as I would like to see. I feel like she may need to potentially go see someone at the lymphedema clinic as the wraps that she needs or even beyond what we can do here at the wound care center. She really does not have wounds she just has open areas of weeping that are causing some difficulty for her. Subsequently because of this and the moisture I am concerned about the potential for infection I am going to likely give her a prophylactic antibiotic today, Keflex, just to be on the safe side. Nonetheless again there is no obvious signs of active infection at this time. 03/13/2019 on evaluation today patient appears to be doing well with regard to her bilateral lower extremities where she has been weeping compared to even last week's evaluation. I see some areas of new skin growth which is excellent and overall I am very pleased with how things seem to be progressing. No fevers, chills, nausea, vomiting, or diarrhea. 03/20/2019 on evaluation today patient unfortunately is continuing to have issues with significant edema of the left lower extremity. Her right side seems to be doing much better. Unfortunately her left side is showing increased weeping of the lower portion of her leg. This is quite unfortunate obviously we were hoping BHAVYA, FABIEN ([id number]) [id number] Page 9 of 14 to get her into the lymphedema clinic they really do not seem to when I see her how if she is draining. Despite the fact  this is really not wound related but more lymphedema weeping related. Nonetheless I do not know that this can be helpful for her to even go for that appointment since again I am not sure there is much that they would actually do at this point. We may need to try a 4 layer compression wrap as best we can on her leg. She is on the Augmentin currently although I am still concerned about whether or not there  could be potentially something going on infection wise I would obtain a culture though I understand is not the best being that is a surface culture I just 1 to make sure I do not seem to be missing anything. 03/27/2019 on evaluation today patient appears to be doing much better in regard to the left lower extremity compared to last week. Last week she had tremendous weeping which I think was subsequent to infection now she seems to be doing much better and very pleased. This is not completely healed but there is a lot of new skin growth and it has dried out quite a bit. Overall I think that we are doing well with how things are moving along at this time. No fevers, chills, nausea, vomiting, or diarrhea. 04/03/2019 evaluation today patient appears to be doing a little worse this week compared to last time I saw her. I think this may be due to the fact that she is having issues with not being able to sleep in her bed at least not until last night. She is therefore been in a lift chair and subsequently has also had issues with not been able to use her pumps since she could not get in bed. With that being said the patient overall seems to be doing okay I do think I may want extend the antibiotic for a little bit longer at least until we can see if her edema and her weeping gets better and if it is then obviously I can always discontinue the antibiotics as of next week however I want her to continue to have it over the next week. 04/10/2019 on evaluation today patient unfortunately is still doing poorly with regard to her left lower extremity. Her right is all things considering doing fairly well. On the left however she continues to have spreading of the area of infection and weeping which appears to be even a larger surface area than noted last week. She did have a positive culture for Pseudomonas in particular which seems to have been of concern she still has green/yellow discharge consistent  with Pseudomonas and subsequently a tremendous amount of it. This has me obviously still concerned about the infection not really clearing up despite the fact that on culture it appears the Cipro should have been a good option for treating this. I think she may at this point need IV antibiotics since things are not doing better I do not want to get worse and cause sepsis. She is in agreement with the plan and believes as well that she likely does need to go to the hospital for IV vancomycin. Or something of the like depending on what the recommendation is from the ER. 04/17/2019 on evaluation today patient appears to be doing excellent in regard to her lower extremity on the left. She was in the hospital for several days from when I sent her last we saw her until just this past Tuesday. Fortunately her drainage is significantly improved and in fact is mostly clear. There is just a  couple small areas that may still drain a little bit she states that the Biron they prescribed for her at discharge she went picked up from pharmacy and got home but has not been able to find it since. She is looked everywhere. She is wondering if I will replace that for her today I will be more than happy to do that. 05/01/2019 on evaluation today patient actually appears to be doing quite well with regard to her lower extremities. She occasionally is having areas that will leak and then heal up mainly when a piece of the fibrotic skin pops off but fortunately she is not having any signs of active infection at this time. Overall she also really does not have any obvious weeping at this time. I do believe however she really needs some compression wraps and I think this may be a good time to get her back to the lymphedema clinic. 05/11/2019 on evaluation today patient actually appears to be doing quite well with regard to her bilateral lower extremities. She occasionally will have a small area that we per another but in  general seems to be completely healed which is great news. Overall very pleased with how everything seems to be progressing. She does have her appointment with lymphedema clinic on November 18. 05/25/2019 on evaluation today patient appears to be doing well with regard to her left lower extremity. I am very pleased in this regard. In regard to her right leg this actually did start draining more I think it is mainly due to the fact that her leg is more swollen. I am not seeing any obvious signs of infection at this time although that is definitely something were obviously acutely aware of simply due to the fact that she had an issue not too far back with exactly this issue. Nonetheless I do feel like that lymphedema clinic would still be beneficial for her. I explained obviously if they are not able to do anything treatment wise on the right leg we could at least have them treat her left leg and then proceed from there. The patient is really in agreement with that plan. If they are able to do both as the drainage slows down that I would be happy to let them handle both. 06/01/2019 on evaluation today patient unfortunately appears to be doing worse with regard to her right lower extremity. The left lower extremity is still maintaining at this point. Unfortunately she has been having significantly increased pain over the past several days and has been experiencing as well increased swelling of the right lower extremity. I really do not know that I am seeing anything that appears to be obvious for infection at this point to be peripherally honest. With that being said the patient does seem to be having much more swelling that she is even experienced in the past and coupled with increased pain in her hip as well I am concerned that again she could potentially have a DVT although I am not 100% sure of this. I think it something that may need to be checked out. We discussed the possibility of sending her for a  DVT study through the hospital but unfortunately transportation is an issue if she does have a DVT I do not want her to wait days to be able to get in for that test however if she has this scheduled as an outpatient that is as fast that she will be able to get the test scheduled for transportation purposes. That  will also fall on Thanksgiving so subsequently she did actually be looking at either Friday or even next week before we would know anything back from this. That is much too long in my opinion. Subsequent to the amount of discomfort she is experiencing the patient is actually okay with going to the ER for evaluation today. 06/12/2019 on evaluation today patient actually appears to be doing significantly better compared to last time I saw her. Following when I last saw her she was actually in the hospital from that Monday until the following Sunday almost 1 full week. She actually was placed on Keflex in the hospital following the time for her to be discharged and Dr. Joylene Draft has recommended 2 times a day dosing of the Keflex for the next year in order to help with more prophylactic/preventative measures with regard to her developing cellulitis. Overall I think this sounds like an excellent plan. The patient unfortunately is good to have trouble being treated at lymphedema clinic due to the fact that she really cannot get up on the bed that they have there. They also state that they cannot manage her as long as she has anything draining at this point. Obviously that is somewhat unfortunate as she does need help with edema control but nonetheless we will have to do what we can for her outside of it sounds like the lymphedema clinic scenario at this point. 06/19/2019 on evaluation today patient appears to be doing fairly well with regard to her bilateral lower extremities. She is not nearly as swollen and shows no signs of infection at this point. There is no evidence of cellulitis whatsoever. She  also has no open wounds or draining at this point which is also good news. No fever chills noted. She seems to be in very good spirits and in fact appears to be doing quite well. READMISSION 11/27/2019 This is a 67 year old woman that we have had in this clinic several times before including 2015, 16 and 19 and then most recently from 03/20/2019 through 06/19/2019 with bilateral lower extremity lymphedema. She has had previous arterial and reflux studies done years ago which were not all that remarkable. 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 She does have a positive family history and she had large legs starting may be in her 49s. She was recently in hospital from 10/20/2019 through 10/28/2019 with right leg cellulitis. She was given Ancef and clindamycin and then Zosyn when a culture showed Pseudomonas. At that time there was purulent drainage. She was followed by infectious disease Dr Joylene Draft. The patient is now back at home. She has noted increased swelling in the right and no drainage in her right leg mostly on the posterior medial aspect in the calf area. She has not had pain or fever. She has literally been improved lysing above dressings because her at the area of this is far too large for standard compression. She has been wrapping the areas with sheets to resorptive pads. She is found these helped somewhat. She does have an appointment with the lymphedema clinic in [place] in late June. Past medical history includes bilateral lymphedema, hypertension, obstructive sleep apnea with CPAP. Recent hospitalization with apparently Pseudomonas cellulitis of the right lower leg 12/15/2019 upon evaluation today patient appears to be doing a little bit worse in regard to her right lower extremity. Unfortunately she is having more weeping down in the lower portion of her leg. Fortunately there is no  signs of active infection at this time. No  fever chills noted. The patient states she is not having increased pain except for when she attempted to use the lymphedema pumps unfortunately she states that she did have pain when she did this. Otherwise we been using absorptive dressings of one type or another she is using diapers at home and then subsequently Ace wraps. In regard to the barrier cream we have discussed the possibility of derma cloud which she would like to try I do not have a problem with that. JOCELL, GERHOLD ([id number]) [id number] Page 10 of 14 12/22/2019 upon evaluation today patient actually appears to be doing better in regard to her leg ulcers at this point. Fortunately there does not appear to be any signs of active infection which is great news and I am extremely pleased with where things are progressing at this time. There is no sign of active infection currently. The patient is very pleased to see things doing so well. 12/29/2019 upon evaluation today patient appears to be doing a little bit better in regard to her weeping in general over her lower extremities. She does have some signs of mild erythema little bit more than what I noted last week or rather last visit. Nonetheless I think that my threshold for switching her antibiotics from Keflex to something else is very low at this point considering that she has had such severe infections in the past that seem to come almost out of nowhere. There is a little erythema and warmth noted of the lower portion of her leg compared to the upper which also makes me want to go ahead and address things more rapidly at this point. Likely I would switch out the Keflex for something like Levaquin ideally. 7/16; patient with severe bilateral lymphedema. She has superficial wounds albeit almost circumferential now on the left lateral lower leg. This may be new from last time. Small area on the right anterior lower leg and then another area on the right medial  lower leg and of pannus fold. She has been using various absorptive garments. She states she is using her compression pumps once a day occasionally twice. Culture from her last visit here was negative 01/29/2020 on evaluation today patient appears to be doing excellent at this point in regard to her legs with regard to infection I see no signs of active infection at this point. She still does have unfortunately areas of weeping this is minimal on the right now her left is actually significantly worse although I do not think it is as bad as last week with Dr. Leanord Hawking saw her. She has been trying to pump and elevate her legs is much as possible. She has previously been on the Keflex and in the past for prevention that seems to do fairly well and likely can extend that today. 02/04/2020 on evaluation today patient appears to be doing better in regard to her legs bilaterally. Fortunately there is no signs of active infection at this time which is great news and overall she has less weeping on the left compared to the right and there is several spots where she is pretty much sealed up with no draining regions. Overall very pleased in this regard. 02/19/2020 on evaluation today patient appears to be doing very well in regard to her wounds currently. Fortunately there is no evidence of active infection overall very pleased with where things stand. She is significantly improved in regard to her edema I am extremely pleased  in this regard she tells me that the popping no longer hurts and in fact she actually looks forward to it. 03/04/2020 on evaluation today patient appears to be doing excellent in regard to her lower extremities. Fortunately there is no signs of active infection at this time. No fevers, chills, nausea, vomiting, or diarrhea. 03/25/2020 on evaluation today patient appears to be doing a little bit more poorly in regard to her legs at this point. She tells me that she is still continue to have issues  with drainage and this has been a little bit worse she was getting ready to start taking the Keflex again but wanted to see me first. Fortunately there is no signs of active infection at this time. No fevers, chills, nausea, vomiting, or diarrhea. 04/15/2020 upon evaluation today patient appears to be doing somewhat poorly in regard to her right leg. She tells me she has been having more pain she has been taking the Keflex that was previously prescribed unfortunately that just does not seem to help with this. She was hoping that the pain on her right was actually coming from the fact that she was having issues with her wrap having gotten caught in her recliner. With that being said she tells me that she knew something was not right. Currently her right leg is warm to touch along with being erythematous all the way up to around at least mid thigh as far as I can see. The left leg does not appear to be doing that badly though there is increased weeping around the ankle region. 05/06/2020 on evaluation today patient appears to be doing much better than last time I saw her. She did go to the hospital where she was admitted for 2 days and treated with antibiotic therapy. She was discharged with antibiotics as well and has done extremely well. I am extremely pleased with where things stand today. There is no signs of active infection at this time which is great news. 05/27/2020 upon evaluation today patient appears to be doing well with regard to her lower extremities bilaterally. She has just a very tiny area on the right leg which is opening on the left leg she is significantly improved though she still has several areas that do appear to be open this is minimal compared to what is been in the past. In general I am extremely pleased with where things stand today. The patient does tell me she is not been using her pumps quite as much as she should be. I do believe that is 1 area she can definitely work on.  She has had a lot going on including a Covid exposure and apparently also a outbreak of likely shingles. 06/24/2020 upon evaluation today patient appears to be doing well with regard to her legs in general although the left leg unfortunately is showing some signs of erythema she does have a little bit of increased weeping and to be honest I am concerned about infection here. I discussed that with her today and I think that we may need to address this sooner rather than later she has been taking Keflex she is not really certain that is been making a big improvement however. No fevers, chills, nausea, vomiting, or diarrhea. 06/30/2020 upon evaluation today patient's legs actually seem to be doing better in my opinion as compared to where they were last week. Fortunately there does not appear to be any signs of active infection. Her culture showed multiple organisms nothing predominate. With that  being said the Levaquin seems to have done well I think she has improved since I last saw her as well. 07/14/2020 upon evaluation today patient appears to be doing actually better in regard to her lower extremities in my opinion. She has been tolerating the dressing changes without complication. Fortunately there is no signs of active infection at this time. 08/04/2020 upon evaluation today patient appears to be doing excellent in regard to her leg ulcers. Fortunately she has very little that is open at this point. This is great news. In fact I think that the Goldbond medicated powder has been excellent for her. It seems to have done the trick where we had tried several other things without as much success. Fortunately there is no evidence of active infection at this time. No fevers, chills, nausea, vomiting, or diarrhea. 09/01/2020 upon evaluation today patient appears to be doing a little bit more poorly than the last time I saw her. She tells me right now that she has been having a lot of drainage compared to  where things were previous which has unfortunately led to more irritation as well. She is concerned that this is leading to infection. Again based on what I am seeing today as well I am also concerned of the same to be honest. 09/15/2020 upon evaluation today patient appears to be doing well with regard to her legs compared to what I saw previous. Fortunately there does not appear to be any evidence of active infection at this time which is great news. At least not as badly as what it was previous. With that being said I do believe that the patient does need to have an extension of the antibiotics. This I believe will actually help her more in the way of making sure this stays under control and does not worsen. That was the Bactrim DS. Readmission: 08/01/2021 upon evaluation today patient appears to be doing actually pretty well all things considered. Is actually been almost a year since have seen her last March. Fortunately I do not see any evidence of active infection locally nor systemically at this point. She does have a couple open areas on the left leg the right leg appears to be doing quite well. In general she has been in the hospital 3 times since I saw her a year ago all 4 cellulitis type issues except for the last 1 which was actually more related to congestive heart failure for that reason she is not using her lymphedema pumps at this point and that is probably the best idea. 08/08/2021 upon evaluation today patient appears to be doing well with regard to her legs all things considered her left leg may be a little bit more swollen based on what I see today. Fortunately I do not see any signs of active infection locally nor systemically at this time which is great news. No fevers, chills, nausea, vomiting, or diarrhea. 08/15/2021 upon evaluation today patient actually appears to be doing excellent in regard to her wounds. She has been tolerating the dressing changes without complication.  Fortunately I see no evidence of infection currently which is great news. No fevers, chills, nausea, vomiting, or diarrhea. 08/29/2021 upon evaluation patient appears to be doing excellent she in fact is almost completely healed. I am actually very pleased with where we stand today and I think that she is making wonderful progress she just has a small area of weeping on the right lateral leg everything else is pretty much completely healed which  is also. EMERSYN, FRIAS ([id number]) [id number] Page 11 of 14 09/05/2021 upon evaluation today patient appears to be doing well with regard to her legs. She does have a small area of weeping on the right leg and there is a small area on the left leg as well. It is potentially something that may need to be addressed in the future more specifically but right now I think that there may have just been a little drainage here on the left. With all that being said I think that this still does not appear to be infected and overall I feel like she is doing quite well. That something we always have to keep a very close eye on as she can change very rapidly from okay to not okay. 09/12/2021 upon evaluation today patient appears to be doing a little bit worse in regard to her leg and actually somewhat concerned about the possibility of infection here. I discussed that with the patient. For that reason I am going to go ahead and see about getting her set up for a repeat prescription for the Bactrim. She is in agreement with that plan. 3/14; patient presents for follow-up. She has been using silver alginate with dressing changes. She is still taking Bactrim prescribed at last clinic visit. She has no issues or complaints today. She has lymphedema pumps however does not use these. 09/26/2020 upon evaluation today patient appears to actually be doing well in regard to her right leg I am pleased in that regard. Unfortunately she has new areas on her  left leg that are open at this point that I do need to be addressed. I am also concerned about the warmth around one of the wounds on the more anterior side. I think this is something that we may need to culture today potentially requiring antibiotics going forward. 10/03/2021 upon evaluation today patient unfortunately is not doing nearly as well as she was even last week with her wounds. She is having some issues here with increased cellulitis of the left lower extremity unfortunately. I did prescribe Augmentin for her eczema prescribed last week unfortunately she never actually received this prescription. The pharmacy stated they never got it. We double checked with them today they still did not receive it. I am not sure what happened in that regard. With that being said the patient is in good spirits today she does not appear to be as sick as where she normally is when the cellulitis gets significantly worse as it has in the past. Nonetheless the leg is much more red not just around the ankle where I was seeing at last week on Tuesday but rather now this is going all the way up to almost her knee and is definitely hot to touch compared to the right leg. Nonetheless I feel like she does need to go to the ER for likely IV antibiotics. 10-10-2021 upon evaluation today patient appears to be doing well with regard to her wound. She has been tolerating the dressing changes that appears to be doing much better. After I saw her last week she did go to the ER they admitted her to the hospital and gave her IV antibiotics she fortunately is doing significantly better. Overall I am extremely pleased with where things stand today. 10-17-2021 upon evaluation today patient appears to be doing well with regard to her wound. In fact this is looking better and her leg is looking better but she still does have some areas here of erythema.  We will continue to address this as soon as possible in my opinion. I do believe she  may require some debridement and what appears to be a more defined wound on her left leg at this point. 10-24-2021 upon evaluation patient is definitely showing signs of improvement which is great news. I do not see any evidence of active infection locally or systemically which is great news as well. No fevers, chills, nausea, vomiting, or diarrhea. 10-31-2021. Upon evaluation today patient's leg actually feels better from an infection and wound care perspective. I am actually very pleased in this regard. Unfortunately the biggest issue that I see at this time is that the patient is having a significant issue with her breathing. I did put her on the pulse ox machine to check her oxygen saturation and it was pretty much at 100% which is good but she tells me that when she is walking this is much more difficult for her. She also tells me that when she is trying to bend over to perform her dressing changes she is so short of breath due to the swelling and fluid in her abdominal area that she is just not been able to do this. She is tearful and very concerned today to be honest. I am truly sorry to see her this way I know she is really having a hard time at the moment. 11-14-2021 upon evaluation patient actually appears to be doing significantly better compared to when I last saw her. She was actually admitted to the hospital I believe this for 6 days total after I sent her on 10-31-2021. Subsequently they actually pulled off greater than 50 pounds of fluid she tells me she feels significantly better her legs are not as tight she actually has some play in the tissue and it is obvious that she is doing much better just from a mobility standpoint as well as a breathing standpoint. Overall I am extremely happy for her and how she is doing the leg also looks much better on the left. 11-21-2021 upon evaluation today patient appears to be doing well although it does appear that she is going require some sharp  debridement the wound is very dry this is the opposite of what its been she was having so much weeping that it was staying extremely wet. Nonetheless we do need to see about going ahead and debriding the wound today. 11-28-2021 upon evaluation today patient appears to be doing well with regard to her wound I definitely see signs of things continuing to improve which is great news. I do not see any evidence of active infection locally or systemically also great news. 12-05-2021 upon evaluation today patient appears to be doing well with regard to her wound. She has been tolerating the dressing changes. Fortunately there does not appear to be any signs of active infection locally or systemically at this time. No fevers, chills, nausea, vomiting, or diarrhea. 12-12-2021 upon evaluation patient appears to be doing well with regard to her wound this is actually measuring smaller and looking much better. Fortunately I do not see any signs of active infection locally or systemically at this time which is excellent news. 6/13; small wound area in the folds of her lymphedema just above the left ankle. Fortunately the area looks quite good. She has been using silver alginate ABDs and an Ace wrap which she is able to change her self. She is not using her compression pumps out of fear that this could contribute to heart  failure. 12-26-2021 upon evaluation patient's wounds are actually showing signs of excellent improvement. I am very pleased with where things stand. Overall I think that she has been making excellent progress and I think we are very close to complete resolution which is great news. 01-11-2022 upon evaluation today patient appears to be doing well currently in regard to her legs in general although on the left leg she does have 1 area that still irritated and inflamed on the anterior portion of her leg. Fortunately I do not see any signs of systemic infection locally there might be some infection going  on here however which is my biggest concern. Fortunately I do not see any evidence though of this spreading to any other location. 01-16-2022 upon evaluation today patient has actually showing signs of excellent improvement. Fortunately I do not see any evidence of infection locally or systemically which is great news and overall I am very pleased with where we stand at this point. 01-25-2022 upon evaluation today patient actually appears to be doing decently well in regard to her legs. She has been tolerating the dressing changes without complication. Fortunately there does not appear to be any evidence of active infection locally or systemically which is great news and overall I am extremely pleased with where we stand at this point. 02-13-2022 upon evaluation today patient appears to be doing well currently in regard to her wound which is actually showing signs of significant improvement. Fortunately I do not see any evidence of active infection locally or systemically at this time. No fevers, chills, nausea, vomiting, or diarrhea. 02-20-2022 upon evaluation today patient appears to be doing well currently in regard to her wound. She has been tolerating the dressing changes without complication. Fortunately there does not appear to be any signs of active infection locally or systemically at this time. No fevers, chills, nausea, vomiting, or diarrhea. 02-27-2022 upon evaluation today patient appears to be doing excellent in regard to her wounds on the leg. She has been tolerating the dressing changes without complication this is very close to complete resolution. Fortunately I do not see any signs of infection locally or systemically at this time which is great news. No fevers, chills, nausea, vomiting, or diarrhea. 03-06-2022 upon evaluation today patient appears to be doing well currently in regard to her wound in general. She has been tolerating the dressing changes LANIYA, MAZAK ([id number])  [id number] Page 12 of 14 without complication. Fortunately I do not see any evidence of active infection locally or systemically at this time which is great news. I am concerned a little bit however about not really her wounds but the second toe right foot where there appears to be some cellulitis after she dropped a frying pan on this about 2 weeks ago. She tells me its been itching and burning and I do think that it is possible she may have an infection based on what we are seeing currently. 03-20-2022 upon evaluation today patient actually appears to be doing much better at this point. Fortunately there does not appear to be any signs of active infection locally or systemically at this time which is great news. No fevers, chills, nausea, vomiting, or diarrhea. 03-27-2022 upon evaluation today patient appears to be doing somewhat poorly in regard to her legs compared to previous. Her right leg has a couple areas that are open and she is very warm and painful to touch around the lateral portion of the ankle area. Left leg is showing signs of  little bit more drainage and weeping as well I think that this is probably due to the fact that she is swelling a lot more that she has been in the past. Fortunately I do not see any signs of active infection systemically though locally there is definitely some issues here. 04-03-2022 upon evaluation today patient appears to be doing poorly in regard to her wounds on the legs unfortunately. She also seems to have more significant cellulitis that has ensued. Unfortunately she has been on doxycycline initially it seemed to be helping but as of this week that is no longer the case. It started getting worse last week and with the reinitiation of the doxycycline nothing has improved. Often when she gets like this IV antibiotics are necessary. 04-19-2022 upon evaluation today patient appears to be doing well currently in regard to her legs from  an infection standpoint. She did get IV vancomycin in the hospital and states that her legs did get better when she left she was no longer weeping but she had been in the bed for 4 days straight. With that being said since coming home her legs have begun to weep again the original wound that we were dealing with has healed she does have some weeping over both lower extremities however which is still of concern. 05-03-2022 upon evaluation today patient appears to be doing well currently in regard to her left leg which I feel like is a little bit better unfortunately the right leg is still draining significantly. Fortunately I do not see any signs of infection locally or systemically at this time. No fevers, chills, nausea, vomiting, or diarrhea. 05-10-2022 unfortunately patient actually showed signs of increasing erythema today. I believe that the cellulitis never completely resolved from when she was in the hospital last. Again she has been on doxycycline previously by myself I gave her Keflex more recently as an ongoing prescription to try to prevent infection but again I do not think this is strong enough to be able to get this under control. I really feel like that she is probably can need IV antibiotics and a good period of time in the hospital to be able to keep her legs elevated and not having to get around to do a whole lot in order to allow this to resolve. The patient voiced understanding. 05-24-2022 upon evaluation today patient appears to be doing excellent in regard to her legs. She has been tolerating the dressing changes without complication. Fortunately there does not appear to be any signs of infection at this time which is great news she has been on cefadroxil since she was discharged from the hospital. The day that we sent her she actually tells me that she ended up in septic shock that day and coded. When she first showed up they did not notice it on the lab work but said by the end  of the day she had already undergone this and they had had to address the issue obviously when she coded. She tells me that likely she would not be here today if she had not gone to the hospital that day. I explained to her that I do believe that was a God thing and it was just the perfect timing that he had for her at that time. 06-14-2022 upon evaluation today patient appears to be doing well currently in regard to her legs. In general I think she is making good progress and I do not see any signs of infection at this  time which is great news. No fevers, chills, nausea, vomiting, or diarrhea. 06-28-2022 upon evaluation today patient appears to be doing excellent in regard to her legs. She is actually showing signs of excellent improvement I do not see any evidence of infection locally nor systemically at this time. No fevers, chills, nausea, vomiting, or diarrhea. 07-26-2022 upon eval evaluation today patient appears to be doing well currently in regard to her wound. She has been tolerating the dressing changes without complication. Fortunately I do not see any major issues here she has a couple areas of weeping mainly on the right leg the left leg seems to be doing pretty well. 08-23-2021 upon evaluation today patient appears to be doing excellent in regard to her legs. These are showing signs of wonderful improvement I am actually very pleased with where we stand I think she is doing the best that I have ever seen her. Fortunately I do not see any evidence of infection locally or systemically which is great news. 09-27-2022 upon evaluation today patient appears to be doing well she has no open wounds at this time her lymphedema seems to be doing quite well at this point. Fortunately I do not see any signs of active infection locally nor systemically which is great news and overall her swelling though up a little bit is not nearly as bad as it has been at times. No evidence of cellulitis  currently. 10-25-2022 upon evaluation patient appears to be doing excellent in regard to her wounds currently. I do not see anything open that this is great her leg still has the bilateral lymphedema but again this is not nearly as severe as what it has been in the past I think she is doing really well. She is using the pumps when she can although she does tell me there is been some issue there with her arthritis at times not being able to do what she needs to do. 11-27-2022 upon evaluation today patient appears to be doing well currently in regard to her legs which do not show any signs of opening she still has significant lymphedema bilaterally. Fortunately I do not see any evidence of active infection locally nor systemically which is great news. 12-25-2022 upon evaluation today patient appears to be doing excellent in fact she is showing signs of her legs still doing great with no openings and overall very pleased in that regard. She has been using her lymphedema wraps that she purchased and I do seem to be helping her she tells me. Extremely pleased with this. 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 She has severe bilateral lymphedema with marked skin changes related to this. She has compression pumps at home but tells me she does not use them lately because of hip and lower back issues difficulty getting into bed etc. She was also supposed to be Ace wrapping her lower leg she does not do this on the right leg she has a special type of wrap that she uses from her mid calf up to her knee. She did not have any obvious open wounds but are intake nurse did notice the weeping lower area at on the right just above her ankle Objective Constitutional Sitting or standing Blood Pressure is within target range for patient.. Pulse regular and within target range for patient.Marland Kitchen Respirations regular, non-labored and within target range.. Temperature is normal and within  the target range for the patient.Marland Kitchen appears in no distress. Vitals Time Taken:  8:24 AM, Height: 63 in, Weight: 372 lbs, BMI: 65.9, Temperature: 98.1 F, Pulse: 83 bpm, Respiratory Rate: 18 breaths/min, Blood Pressure: 110/74 mmHg. FEONA, KUETHE ([id number]) [id number] Page 13 of 14 General Notes: Wound exam; bilateral lymphedema with thickened fissured lymphedema skin. On the left she has polyps secondary to lymphedema but none of these look ominous. On the right she does have a weeping open area just above the right ankle but no frank wounds are seen. Dorsalis pedis pulses are palpable bilaterally. The concerning area is on the right where the lymphedema is tense from the mid tibia down. There is some developing erythema. 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 Wash wounds with antibacterial soap and water. May shower; gently cleanse wound with antibacterial soap, rinse and pat dry prior to dressing wounds No tub bath. Edema Control - Lymphedema / Segmental Compressive Device / Other: Elevate, Exercise Daily and Avoid Standing for Long Periods of Time. Elevate leg(s) parallel to the floor when sitting. DO YOUR BEST to sleep in the bed at night. DO NOT sleep in your recliner. Long hours of sitting in a recliner leads to swelling of the legs and/or potential wounds on your backside. Other: - use lymphedema pumps at least twice daily wrap RLE from knee down with ace wrap use velcro wrap to upper calf area Non-Wound Condition: Cleanse affected area with antibacterial soap and  water, Medications-Please add to medication list.: Topical Antibiotic - apply Mupirocin to weepy areas as needed 1. There is no open area but I am concerned about the right lower leg from roughly the mid tibia area down to the pantaloon deformity on the right ankle 2. I have asked her to Ace wrap this area as she is previously done. I am okay with using her Velcro wrap from the mid tibia to her knee. 3. I have spent some time talking to her about using the compression pumps twice a day at least on the right, but preferably bilaterally. I think she is at some risk of imminent breakdown on the right Electronic Signature(s) Signed: 03/05/2023 5:06:52 PM By: Baltazar Najjar MD Entered By: Baltazar Najjar on 03/05/2023 08:56:14 -------------------------------------------------------------------------------- SuperBill Details Patient Name: Date of Service: TANYRA, POTTINGER 03/05/2023 Medical Record Number: [id number] Patient Account Number: [id number] Date of Birth/Sex: Treating RN: 1955-12-28 (67 y.o. Freddy Finner Primary Care Provider: [name] Crutch Other Clinician: Betha Loa Referring Provider: Treating Provider/Extender: Chauncey Mann, MICHA EL Almyra Brace in Treatment: [address] MAIC[place], ZEBLEY ([id number]) [id number] Page 14 of 14 ICD-10 Codes Code Description Q82.0 Hereditary lymphedema L97.811 Non-pressure chronic ulcer of other part of right lower leg limited to breakdown of skin L97.821 Non-pressure chronic ulcer of other part of left lower leg limited to breakdown of skin Facility Procedures : CPT4 Code: 23557322 Description: 02542 - WOUND CARE VISIT-LEV 2 EST PT Modifier: Quantity: 1 Physician Procedures : CPT4 Code Description Modifier [id number] - WC PHYS LEVEL 3 - EST PT ICD-10 Diagnosis Description L97.811 Non-pressure chronic ulcer of other part of right lower leg limited to breakdown of skin L97.821 Non-pressure  chronic ulcer of other part  of  left lower leg limited to breakdown of skin Q82.0 Hereditary lymphedema Quantity: 1 Electronic Signature(s) Signed: 03/05/2023 5:06:52 PM By: Baltazar Najjar MD Entered By: Baltazar Najjar on 03/05/2023 08:56:33

## 2023-03-07 ENCOUNTER — Telehealth: Payer: Self-pay

## 2023-03-07 NOTE — Progress Notes (Signed)
Pt called stating she just recently found out she was exposed to COVID and just received her Wegovy to start today and doesn't feel comfortable starting it due to her exposure. Pt wants to wait to be sure she doesn't get COVID.   Spoke w/ Clarisa Kindred, FNP. Inetta Fermo states it is ok for pt to wait 10 days for COVID precautions before starting Wegovy if she is concerned about catching COVID. Called pt and notified pt of advisory. Pt agreed, had good understanding and no further questions

## 2023-03-08 NOTE — Progress Notes (Signed)
[name], [name] ([id number]) [id number] Page 1 of 8 Visit Report for 03/05/2023 Arrival Information Details Patient Name: Date of Service: [name] 03/05/2023 8:15 A M Medical Record Number: [id number] Patient Account Number: [id number] Date of Birth/Sex: Treating RN: [birth year] (67 y.o. Freddy Finner Primary Care Larhonda Dettloff: [name] Crutch Other Clinician: Betha Loa Referring Azelea Seguin: Treating Briley Sulton/Extender: Chauncey Mann, MICHA EL Almyra Brace in Treatment: 60 Visit Information History Since Last Visit All ordered tests and consults were completed: No Patient Arrived: Dan Humphreys Added or deleted any medications: No Arrival Time: 08:19 Any new allergies or adverse reactions: No Transfer Assistance: None Had a fall or experienced change in No Patient Identification Verified: Yes activities of daily living that may affect Secondary Verification Process Completed: Yes risk of falls: Patient Requires Transmission-Based Precautions: No Signs or symptoms of abuse/neglect since last visito No Patient Has Alerts: Yes Hospitalized since last visit: No Patient Alerts: Borderline Diabetic Implantable device outside of the clinic excluding No cellular tissue based products placed in the center since last visit: Has Compression in Place as Prescribed: No Pain Present Now: Yes Electronic Signature(s) Signed: 03/06/2023 4:41:54 PM By: Betha Loa Entered By: Betha Loa on 03/05/2023 05:31:52 -------------------------------------------------------------------------------- Clinic Level of Care Assessment Details Patient Name: Date of Service: [name], [name] 03/05/2023 8:15 A M Medical Record Number: [id number] Patient Account Number: [id number] Date of Birth/Sex: Treating RN: [birth year] (66 y.o. Freddy Finner Primary Care Monte Zinni: [name] Crutch Other Clinician: Betha Loa Referring Mairlyn Tegtmeyer: Treating Keerthana Vanrossum/Extender: Chauncey Mann, MICHA EL Almyra Brace in Treatment: 36 Clinic Level of Care Assessment Items TOOL 4 Quantity Score []  - 0 Use when only an EandM is performed on FOLLOW-UP visit ASSESSMENTS - Nursing Assessment / Reassessment X- 1 10 Reassessment of Co-morbidities (includes updates in patient status) X- 1 5 Reassessment of Adherence to Treatment Plan TIMA, THAN ([id number]) [id number] Page 2 of 8 ASSESSMENTS - Wound and Skin A ssessment / Reassessment []  - 0 Simple Wound Assessment / Reassessment - one wound []  - 0 Complex Wound Assessment / Reassessment - multiple wounds X- 1 10 Dermatologic / Skin Assessment (not related to wound area) ASSESSMENTS - Focused Assessment []  - 0 Circumferential Edema Measurements - multi extremities []  - 0 Nutritional Assessment / Counseling / Intervention []  - 0 Lower Extremity Assessment (monofilament, tuning fork, pulses) []  - 0 Peripheral Arterial Disease Assessment (using hand held doppler) ASSESSMENTS - Ostomy and/or Continence Assessment and Care []  - 0 Incontinence Assessment and Management []  - 0 Ostomy Care Assessment and Management (repouching, etc.) PROCESS - Coordination of Care X - Simple Patient / Family Education for ongoing care 1 15 []  - 0 Complex (extensive) Patient / Family Education for ongoing care []  - 0 Staff obtains Chiropractor, Records, T Results / Process Orders est []  - 0 Staff telephones HHA, Nursing Homes / Clarify orders / etc []  - 0 Routine Transfer to another Facility (non-emergent condition) []  - 0 Routine Hospital Admission (non-emergent condition) []  - 0 New Admissions / Manufacturing engineer / Ordering NPWT Apligraf, etc. , []  - 0 Emergency Hospital Admission (emergent condition) X- 1 10 Simple Discharge Coordination []  - 0 Complex (extensive) Discharge Coordination PROCESS - Special Needs []  - 0 Pediatric / Minor Patient Management []  - 0 Isolation Patient  Management []  - 0 Hearing / Language / Visual special needs []  - 0 Assessment of Community assistance (transportation, D/C planning, etc.) []  - 0 Additional assistance / Altered mentation []  -  0 Support Surface(s) Assessment (bed, cushion, seat, etc.) INTERVENTIONS - Wound Cleansing / Measurement []  - 0 Simple Wound Cleansing - one wound []  - 0 Complex Wound Cleansing - multiple wounds []  - 0 Wound Imaging (photographs - any number of wounds) []  - 0 Wound Tracing (instead of photographs) []  - 0 Simple Wound Measurement - one wound []  - 0 Complex Wound Measurement - multiple wounds INTERVENTIONS - Wound Dressings X - Small Wound Dressing one or multiple wounds 1 10 []  - 0 Medium Wound Dressing one or multiple wounds []  - 0 Large Wound Dressing one or multiple wounds []  - 0 Application of Medications - topical []  - 0 Application of Medications - injection INTERVENTIONS - Miscellaneous []  - 0 External ear exam EUA, LUNT ([id number]) [id number] Page 3 of 8 []  - 0 Specimen Collection (cultures, biopsies, blood, body fluids, etc.) []  - 0 Specimen(s) / Culture(s) sent or taken to Lab for analysis []  - 0 Patient Transfer (multiple staff / Michiel Sites Lift / Similar devices) []  - 0 Simple Staple / Suture removal (25 or less) []  - 0 Complex Staple / Suture removal (26 or more) []  - 0 Hypo / Hyperglycemic Management (close monitor of Blood Glucose) []  - 0 Ankle / Brachial Index (ABI) - do not check if billed separately X- 1 5 Vital Signs Has the patient been seen at the hospital within the last three years: Yes Total Score: 65 Level Of Care: New/Established - Level 2 Electronic Signature(s) Signed: 03/06/2023 4:41:54 PM By: Betha Loa Entered By: Betha Loa on 03/05/2023 05:40:03 -------------------------------------------------------------------------------- Encounter Discharge Information Details Patient Name: Date of  Service: [name] 03/05/2023 8:15 A M Medical Record Number: [id number] Patient Account Number: [id number] Date of Birth/Sex: Treating RN: 12/17/1955 (67 y.o. Freddy Finner Primary Care Clemie General: [name] Crutch Other Clinician: Betha Loa Referring Sybil Shrader: Treating Joella Saefong/Extender: Chauncey Mann, MICHA EL Almyra Brace in Treatment: 85 Encounter Discharge Information Items Discharge Condition: Stable Ambulatory Status: Walker Discharge Destination: Home Transportation: Other Accompanied By: self Schedule Follow-up Appointment: Yes Clinical Summary of Care: Electronic Signature(s) Signed: 03/06/2023 4:41:54 PM By: Betha Loa Entered By: Betha Loa on 03/05/2023 05:50:42 Multi Wound Chart Details -------------------------------------------------------------------------------- [name] ([id number]) [id number] Page 4 of 8 Patient Name: Date of Service: [name], [name] 03/05/2023 8:15 A M Medical Record Number: [id number] Patient Account Number: [id number] Date of Birth/Sex: Treating RN: 1956-01-19 (67 y.o. Freddy Finner Primary Care Starlit Raburn: [name] Crutch Other Clinician: Betha Loa Referring Carma Dwiggins: Treating Jaymar Loeber/Extender: Chauncey Mann, MICHA EL Almyra Brace in Treatment: 36 Vital Signs Height(in): 63 Pulse(bpm): 83 Weight(lbs): 372 Blood Pressure(mmHg): 110/74 Body Mass Index(BMI): 65.9 Temperature(F): 98.1 Respiratory Rate(breaths/min): 18 Wound Assessments Treatment Notes Electronic Signature(s) Signed: 03/06/2023 4:41:54 PM By: Betha Loa Entered By: Betha Loa on 03/05/2023 05:32:06 -------------------------------------------------------------------------------- Multi-Disciplinary Care Plan Details Patient Name: Date of Service: [name], [name] 03/05/2023 8:15 A M Medical Record Number: [id number] Patient Account Number: [id number] Date of Birth/Sex: Treating  RN: [birth year] (67 y.o. Freddy Finner Primary Care Adria Costley: [name] Crutch Other Clinician: Betha Loa Referring Jasneet Schobert: Treating Amarianna Abplanalp/Extender: Chauncey Mann, MICHA EL Almyra Brace in Treatment: 29 Active Inactive Wound/Skin Impairment Nursing Diagnoses: Impaired tissue integrity Knowledge deficit related to ulceration/compromised skin integrity Goals: Ulcer/skin breakdown will have a volume reduction of 30% by week 4 Date Initiated: 08/01/2021 Target Resolution Date: 08/29/2021 Goal Status: Active Ulcer/skin breakdown will have a volume reduction of 50% by week 8 Date  Initiated: 08/01/2021 Target Resolution Date: 09/26/2021 Goal Status: Active Ulcer/skin breakdown will have a volume reduction of 80% by week 12 Date Initiated: 08/01/2021 Target Resolution Date: 10/24/2021 Goal Status: Active Ulcer/skin breakdown will heal within 14 weeks Date Initiated: 08/01/2021 Target Resolution Date: 11/07/2021 Goal Status: Active Interventions: Assess patient/caregiver ability to obtain necessary supplies Assess patient/caregiver ability to perform ulcer/skin care regimen upon admission and as needed Assess ulceration(s) every visit Provide education on ulcer and skin care CASI, ALIMI ([id number]) [id number] Page 5 of 8 Notes: Electronic Signature(s) Signed: 03/06/2023 4:41:54 PM By: Betha Loa Signed: 03/08/2023 11:58:03 AM By: Yevonne Pax RN Entered By: Betha Loa on 03/05/2023 05:40:17 -------------------------------------------------------------------------------- Non-Wound Condition Assessment Details Patient Name: Date of Service: [name], [name] 03/05/2023 8:15 A M Medical Record Number: [id number] Patient Account Number: [id number] Date of Birth/Sex: Treating RN: 1956/02/12 (67 y.o. Freddy Finner Primary Care Linetta Regner: [name] Crutch Other Clinician: Betha Loa Referring Hayleigh Bawa: Treating Yarisbel Miranda/Extender: Chauncey Mann,  MICHA EL Almyra Brace in Treatment: 74 Non-Wound Condition: Condition: Lymphedema Location: Leg Side: Right Notes: Right leg with decreased drainage this visit. Notes Swelling noted, no redness, minimal drainage noted to right medial lower leg, patient denies any pain or discomfort Electronic Signature(s) Signed: 03/06/2023 4:41:54 PM By: Betha Loa Signed: 03/08/2023 11:58:03 AM By: Yevonne Pax RN Entered By: Betha Loa on 03/05/2023 05:31:43 -------------------------------------------------------------------------------- Non-Wound Condition Assessment Details Patient Name: Date of Service: [name] 03/05/2023 8:15 A M Medical Record Number: [id number] Patient Account Number: [id number] Date of Birth/Sex: Treating RN: 02-16-1956 (67 y.o. Freddy Finner Primary Care Iverson Sees: [name] Crutch Other Clinician: Betha Loa Referring Dajsha Massaro: Treating Jael Kostick/Extender: Chauncey Mann, MICHA EL Almyra Brace in Treatment: 74 Non-Wound Condition: Condition: Lymphedema Location: Leg Side: Left Notes: decreased drainage this visit. RYANN, SURRETTE ([id number]) [id number] Page 6 of 8 Notes No drainage, redness noted to left lower leg. Patient denies pain Electronic Signature(s) Signed: 03/06/2023 4:41:54 PM By: Betha Loa Signed: 03/08/2023 11:58:03 AM By: Yevonne Pax RN Entered By: Betha Loa on 03/05/2023 05:31:47 -------------------------------------------------------------------------------- Pain Assessment Details Patient Name: Date of Service: [name], [name] 03/05/2023 8:15 A M Medical Record Number: [id number] Patient Account Number: [id number] Date of Birth/Sex: Treating RN: Jun 12, 1956 (67 y.o. Freddy Finner Primary Care Tiane Szydlowski: [name] Crutch Other Clinician: Betha Loa Referring Chayanne Speir: Treating Yakub Lodes/Extender: Chauncey Mann, MICHA EL Almyra Brace in Treatment: 33 Active  Problems Location of Pain Severity and Description of Pain Patient Has Paino Yes Site Locations Pain Location: Generalized Pain Duration of the Pain. Constant / Intermittento Constant Rate the pain. Current Pain Level: 5 Character of Pain Describe the Pain: Aching Pain Management and Medication Current Pain Management: Medication: No Cold Application: No Rest: No Massage: No Activity: No T.E.N.S.: No Heat Application: No Leg drop or elevation: No Is the Current Pain Management Adequate: Inadequate How does your wound impact your activities of daily livingo Sleep: No Bathing: No Appetite: No Relationship With Others: No Bladder Continence: No Emotions: No Bowel Continence: No Work: No Toileting: No Drive: No Dressing: No Hobbies: No Electronic Signature(sCELENA, [name] ([id number]) [id number] Page 7 of 8 Signed: 03/06/2023 4:41:54 PM By: Betha Loa Signed: 03/08/2023 11:58:03 AM By: Yevonne Pax RN Entered By: Betha Loa on 03/05/2023 05:25:18 -------------------------------------------------------------------------------- Patient/Caregiver Education Details Patient Name: Date of Service: [name] 8/27/2024andnbsp8:15 A M Medical Record Number: [id number] Patient Account Number: [id number] Date of Birth/Gender: Treating RN: 05-Jul-1956 (67 y.o. F) [name],  [name] Primary Care Physician: [name] Crutch Other Clinician: Betha Loa Referring Physician: Treating Physician/Extender: RO BSO Dorris Carnes, MICHA EL Almyra Brace in Treatment: 68 Education Assessment Education Provided To: Patient Education Topics Provided Wound/Skin Impairment: Handouts: Other: continue treatment as directed Methods: Explain/Verbal Responses: State content correctly Electronic Signature(s) Signed: 03/06/2023 4:41:54 PM By: Betha Loa Entered By: Betha Loa on 03/05/2023  05:42:17 -------------------------------------------------------------------------------- Vitals Details Patient Name: Date of Service: [name] 03/05/2023 8:15 A M Medical Record Number: [id number] Patient Account Number: [id number] Date of Birth/Sex: Treating RN: [birth year] (67 y.o. Freddy Finner Primary Care Tyrea Froberg: [name] Crutch Other Clinician: Betha Loa Referring Keondria Siever: Treating Tawonna Esquer/Extender: Chauncey Mann, MICHA EL Almyra Brace in Treatment: 3 Vital Signs Time Taken: 08:24 Temperature (F): 98.1 Height (in): 63 Pulse (bpm): 83 Weight (lbs): 372 Respiratory Rate (breaths/min): 18 Body Mass Index (BMI): 65.9 Blood Pressure (mmHg): 110/74 Reference Range: 80 - 120 mg / dl [name], [name] ([id number]) [id number] Page 8 of 8 Electronic Signature(s) Signed: 03/06/2023 4:41:54 PM By: Betha Loa Entered By: Betha Loa on 03/05/2023 05:25:11

## 2023-03-29 ENCOUNTER — Other Ambulatory Visit: Payer: Self-pay | Admitting: Family

## 2023-04-02 ENCOUNTER — Encounter: Payer: Medicare HMO | Admitting: Family

## 2023-04-02 ENCOUNTER — Ambulatory Visit: Payer: Medicare HMO | Admitting: Physician Assistant

## 2023-04-02 NOTE — Progress Notes (Deleted)
PCP: [facility] (last seen 08/24) Primary Cardiologist: Marcina Millard, MD (last seen 04/24; returns 10/24)  HPI:  Ms [name] is a 67 y/o female with a history of asthma, HTN, arthritis, lymphedema, CKD, secondary hyperparathyroidism, sleep apnea, anemia, spinal stenosis, obesity, previous tobacco use and chronic heart failure.   Admitted 05/10/22 due to worsening edema and drainage. Patient evaluated and found to have cellulitis of the right lower extremity. Had AKI. Vascular and wound consults obtained. Antibiotics started. Became symptomatic with elevated lactic acid and WBC. IVF given and antibiotic changed. CT scan 11/7 did not show any drainable fluid collection. Discharged after 6 days.   Echo 04/17/18: EF 65% with PA pressure of 43 mmHg Echo 04/13/19: EF 55-60% with mild LAE and trace MR Echo 11/01/21: EF of 60-65%.   She presents today for a HF follow-up visit with a chief complaint of minimal fatigue with moderate exertion. Has associated cough and chronic lymphedema (worsened over these last few weeks) and rare angina along with this. Denies shortness of breath, palpitations, dizziness or difficulty sleeping.   Was outside quite a bit yesterday in the heat and was sweating a lot and feels like she got overheated. She went inside her house and didn't feel well so starting drinking more fluids. This morning when she woke up, she said that she was feeling "much better".   Went to the wound center earlier today and they are concerned about a couple of areas on her right lower leg.   ROS: All systems negative except as listed in HPI, PMH and Problem List.  SH:  Social History   Socioeconomic History   Marital status: Single    Spouse name: Not on file   Number of children: Not on file   Years of education: Not on file   Highest education level: Not on file  Occupational History   Not on file  Tobacco Use   Smoking status: Former    Current packs/day: 0.00    Average  packs/day: 0.3 packs/day for 14.0 years (3.5 ttl pk-yrs)    Types: Cigarettes    Start date: 07/09/1976    Quit date: 07/09/1990    Years since quitting: 32.7   Smokeless tobacco: Current    Types: Snuff  Vaping Use   Vaping status: Never Used  Substance and Sexual Activity   Alcohol use: No   Drug use: No   Sexual activity: Not Currently  Other Topics Concern   Not on file  Social History Narrative   Not on file   Social Determinants of Health   Financial Resource Strain: Not on file  Food Insecurity: No Food Insecurity (09/06/2022)   [facility]   Hunger Vital Sign    Worried About Running Out of Food in the Last Year: Never true    Ran Out of Food in the Last Year: Never true  Transportation Needs: No Transportation Needs (09/06/2022)   [facility]   [facility] - Transportation    Lack of Transportation (Medical): No    Lack of Transportation (Non-Medical): No  Physical Activity: Not on file  Stress: Not on file  Social Connections: Not on file  Intimate Partner Violence: Not At Risk (05/10/2022)   Humiliation, Afraid, Rape, and Kick questionnaire    Fear of Current or Ex-Partner: No    Emotionally Abused: No    Physically Abused: No    Sexually Abused: No    FH:  Family History  Problem Relation Age of Onset   Breast cancer Maternal Aunt    Thyroid disease Other     Past Medical History:  Diagnosis Date   Arthritis    Asthma    CHF (congestive heart failure) (HCC)    Enlarged heart    Hypertension    Lymphedema    Sleep apnea    Spinal stenosis     Current Outpatient Medications  Medication Sig Dispense Refill   acetaminophen (TYLENOL) 500 MG tablet Take 500-1,000 mg by mouth every 6 (six) hours as needed for mild pain or fever.     albuterol (VENTOLIN HFA) 108 (90 Base) MCG/ACT inhaler Inhale 1-2 puffs into the lungs every 6 (six) hours as needed for wheezing or shortness of  breath.     bumetanide (BUMEX) 2 MG tablet Take 1 tablet (2 mg total) by mouth daily. Stop Furosemide 30 tablet 4   cetirizine (ZYRTEC) 10 MG tablet Take 1 tablet (10 mg total) by mouth daily. (Patient taking differently: Take 10 mg by mouth daily as needed for allergies.) 60 tablet 0   FARXIGA 10 MG TABS tablet Take 1 tablet (10 mg total) by mouth daily before breakfast. 30 tablet 4   metolazone (ZAROXOLYN) 5 MG tablet Take 0.5 tablets (2.5 mg total) by mouth 2 (two) times a week. 12 tablet 2   mupirocin ointment (BACTROBAN) 2 % Apply 1 Application topically daily as needed (wound care).     NYSTATIN powder Apply 1 Application topically 2 (two) times daily as needed (Yeast Skin infection).     potassium chloride SA (KLOR-CON M) 20 MEQ tablet Take 2 tablets (40 mEq total) by mouth daily. 180 tablet 3   Semaglutide-Weight Management (WEGOVY) 0.25 MG/0.5ML SOAJ Inject 0.25 mg into the skin once a week. 2 mL 0   senna-docusate (SENOKOT-S) 8.6-50 MG tablet Take 1 tablet by mouth 2 (two) times daily as needed for mild constipation or moderate constipation.     spironolactone (ALDACTONE) 25 MG tablet TAKE 0.5 TABLET BY MOUTH ONCE DAILY. 15 tablet 6   No current facility-administered medications for this visit.   There were no vitals filed for this visit.  Wt Readings from Last 3 Encounters:  03/05/23 (!) 355 lb (161 kg)  11/27/22 (!) 361 lb (163.7 kg)  10/08/22 (!) 345 lb 3.2 oz (156.6 kg)   Lab Results  Component Value Date   CREATININE 1.81 (H) 08/31/2022   CREATININE 1.28 (H) 08/03/2022   CREATININE 1.32 (H) 07/23/2022   PHYSICAL EXAM:  General:  Well appearing. No resp difficulty HEENT: normal Neck: supple. JVP flat. No lymphadenopathy or thryomegaly appreciated. Cor: PMI normal. Regular rate & rhythm. No rubs, gallops or murmurs. Lungs: clear Abdomen: soft, nontender, nondistended. No hepatosplenomegaly. No bruits or masses.  Extremities: no cyanosis, clubbing, rash. + bilateral  lymphedema Neuro: alert & oriented x3, cranial nerves grossly intact. Moves all 4 extremities w/o difficulty. Affect pleasant.   ECG: not done   ASSESSMENT & PLAN:  1: Chronic heart failure with preserved ejection fraction- - likely HTN/ sleep apnea - NYHA class II - euvolemic today - has attempted  to weigh daily with standing on 2 scales at home but it fluctuates too much - weight down 6 pounds from last visit here 3 months ago - Echo 04/17/18: EF 65% with PA pressure of 43 mmHg - Echo 04/13/19: EF 55-60% with mild LAE and trace MR - Echo 11/01/21: EF of 60-65%. - reviewed the importance of keeping her sodium 2000mg   as well as keeping daily fluid intake to 60-64 ounces/ day - facial/mouth swelling with ACEi - continue farxiga 10mg   - continue spironolactone 12.5mg  daily; unsure if BP could tolerate titrating this to 25mg  daily - continue bumex 2mg  QOD - continue metolazone 2.5mg  twice weekly - continue potassium daily - saw cardiology Mellissa Kohut) 04/24 - BNP 10/31/21 was 123.9  2: HTN with stage 3 CKD- - BP 103/64 - saw PCP Jesse Sans) @ [facility] ~ 2 weeks ago - saw nephrology Cherylann Ratel) 07/24  - BMP 01/08/23 reviewed and showed sodium 137, potassium 4.2, creatinine 1.59 and GFR 35  3: Lymphedema- - saw wound center 08/24 - right leg wrapped in velcro wrap; has a couple small wounds on right leg  4: Sleep apnea- - not wearing CPAP nightly due to having water in the mask; insurance will not allow another CPAP machine until 2025 - saw pulmonology Welton Flakes) 04/24  5: Morbid obesity- - BMI 11/27/22 was 61.90  - GLP1 PA was submitted by Melford Aase, PharmD today - patient is agreeable to starting this if it gets approved - unable to attend [place] pharmacy clinic due to transportation issues  Return in 6 weeks, sooner if needed

## 2023-04-09 ENCOUNTER — Ambulatory Visit: Payer: Medicare HMO | Admitting: Physician Assistant

## 2023-04-09 ENCOUNTER — Encounter: Payer: Medicare HMO | Admitting: Family

## 2023-04-09 NOTE — Progress Notes (Deleted)
PCP: [facility] (last seen 08/24) Primary Cardiologist: Marcina Millard, MD (last seen 04/24; returns 10/24)  HPI:  Ms [name] is a 67 y/o female with a history of asthma, HTN, arthritis, lymphedema, CKD, secondary hyperparathyroidism, sleep apnea, anemia, spinal stenosis, obesity, previous tobacco use and chronic heart failure.   Admitted 05/10/22 due to worsening edema and drainage. Patient evaluated and found to have cellulitis of the right lower extremity. Had AKI. Vascular and wound consults obtained. Antibiotics started. Became symptomatic with elevated lactic acid and WBC. IVF given and antibiotic changed. CT scan 11/7 did not show any drainable fluid collection. Discharged after 6 days.   Echo 04/17/18: EF 65% with PA pressure of 43 mmHg Echo 04/13/19: EF 55-60% with mild LAE and trace MR Echo 11/01/21: EF of 60-65%.   She presents today for a HF follow-up visit with a chief complaint of     ROS: All systems negative except as listed in HPI, PMH and Problem List.  SH:  Social History   Socioeconomic History   Marital status: Single    Spouse name: Not on file   Number of children: Not on file   Years of education: Not on file   Highest education level: Not on file  Occupational History   Not on file  Tobacco Use   Smoking status: Former    Current packs/day: 0.00    Average packs/day: 0.3 packs/day for 14.0 years (3.5 ttl pk-yrs)    Types: Cigarettes    Start date: 07/09/1976    Quit date: 07/09/1990    Years since quitting: 32.7   Smokeless tobacco: Current    Types: Snuff  Vaping Use   Vaping status: Never Used  Substance and Sexual Activity   Alcohol use: No   Drug use: No   Sexual activity: Not Currently  Other Topics Concern   Not on file  Social History Narrative   Not on file   Social Determinants of Health   Financial Resource Strain: Not on file  Food Insecurity: No Food Insecurity (09/06/2022)   [facility]   Hunger Vital Sign    Worried About Running Out of Food in the Last Year: Never true    Ran Out of Food in the Last Year: Never true  Transportation Needs: No Transportation Needs (09/06/2022)   [facility]   [facility] - Transportation    Lack of Transportation (Medical): No    Lack of Transportation (Non-Medical): No  Physical Activity: Not on file  Stress: Not on file  Social Connections: Not on file  Intimate Partner Violence: Not At Risk (05/10/2022)   Humiliation, Afraid, Rape, and Kick questionnaire    Fear of Current or Ex-Partner: No    Emotionally Abused: No    Physically Abused: No    Sexually Abused: No    FH:  Family History  Problem Relation Age of Onset   Breast cancer Maternal Aunt    Thyroid disease Other     Past Medical History:  Diagnosis Date   Arthritis    Asthma    CHF (congestive heart failure) (HCC)    Enlarged heart    Hypertension    Lymphedema    Sleep apnea    Spinal stenosis     Current Outpatient Medications  Medication Sig Dispense Refill   acetaminophen (TYLENOL) 500 MG tablet Take 500-1,000 mg by mouth every 6 (six) hours as needed for mild pain or fever.  albuterol (VENTOLIN HFA) 108 (90 Base) MCG/ACT inhaler Inhale 1-2 puffs into the lungs every 6 (six) hours as needed for wheezing or shortness of breath.     bumetanide (BUMEX) 2 MG tablet Take 1 tablet (2 mg total) by mouth daily. Stop Furosemide 30 tablet 4   cetirizine (ZYRTEC) 10 MG tablet Take 1 tablet (10 mg total) by mouth daily. (Patient taking differently: Take 10 mg by mouth daily as needed for allergies.) 60 tablet 0   FARXIGA 10 MG TABS tablet Take 1 tablet (10 mg total) by mouth daily before breakfast. 30 tablet 4   metolazone (ZAROXOLYN) 5 MG tablet Take 0.5 tablets (2.5 mg total) by mouth 2 (two) times a week. 12 tablet 2   mupirocin ointment (BACTROBAN) 2 % Apply 1 Application topically daily as needed (wound care).      NYSTATIN powder Apply 1 Application topically 2 (two) times daily as needed (Yeast Skin infection).     potassium chloride SA (KLOR-CON M) 20 MEQ tablet Take 2 tablets (40 mEq total) by mouth daily. 180 tablet 3   Semaglutide-Weight Management (WEGOVY) 0.25 MG/0.5ML SOAJ Inject 0.25 mg into the skin once a week. 2 mL 0   senna-docusate (SENOKOT-S) 8.6-50 MG tablet Take 1 tablet by mouth 2 (two) times daily as needed for mild constipation or moderate constipation.     spironolactone (ALDACTONE) 25 MG tablet TAKE 0.5 TABLET BY MOUTH ONCE DAILY. 15 tablet 6   No current facility-administered medications for this visit.     PHYSICAL EXAM:  General:  Well appearing. No resp difficulty HEENT: normal Neck: supple. JVP flat. No lymphadenopathy or thryomegaly appreciated. Cor: PMI normal. Regular rate & rhythm. No rubs, gallops or murmurs. Lungs: clear Abdomen: soft, nontender, nondistended. No hepatosplenomegaly. No bruits or masses.  Extremities: no cyanosis, clubbing, rash. + bilateral lymphedema Neuro: alert & oriented x3, cranial nerves grossly intact. Moves all 4 extremities w/o difficulty. Affect pleasant.   ECG: not done   ASSESSMENT & PLAN:  1: Chronic heart failure with preserved ejection fraction- - likely HTN/ sleep apnea - NYHA class II - euvolemic today - has attempted  to weigh daily with standing on 2 scales at home but it fluctuates too much - weight 355 pounds from last visit here 5 weeks ago - Echo 04/17/18: EF 65% with PA pressure of 43 mmHg - Echo 04/13/19: EF 55-60% with mild LAE and trace MR - Echo 11/01/21: EF of 60-65%. - reviewed the importance of keeping her sodium 2000mg  as well as keeping daily fluid intake to 60-64 ounces/ day - facial/mouth swelling with ACEi - continue farxiga 10mg   - continue spironolactone 12.5mg  daily; unsure if BP could tolerate titrating this to 25mg  daily - continue bumex 2mg  QOD - continue metolazone 2.5mg  twice weekly - continue  potassium daily - saw cardiology Mellissa Kohut) 04/24 - BNP 10/31/21 was 123.9  2: HTN with stage 3 CKD- - BP  - saw PCP Jesse Sans) @ [facility] ~ 2 weeks ago - saw nephrology Cherylann Ratel) 07/24  - BMP 01/08/23 reviewed and showed sodium 137, potassium 4.2, creatinine 1.59 and GFR 35  3: Lymphedema- - saw wound center 08/24 - right leg wrapped in velcro wrap; has a couple small wounds on right leg  4: Sleep apnea- - not wearing CPAP nightly due to having water in the mask; insurance will not allow another CPAP machine until 2025 - saw pulmonology Welton Flakes) 04/24  5: Morbid obesity- - BMI 11/27/22 was 61.90  - wegovy -  unable to attend [place] pharmacy clinic due to transportation issues

## 2023-04-15 ENCOUNTER — Other Ambulatory Visit: Payer: Self-pay | Admitting: Family

## 2023-04-16 ENCOUNTER — Ambulatory Visit: Payer: Medicare Other | Attending: Family | Admitting: Family

## 2023-04-16 ENCOUNTER — Encounter: Payer: Medicare Other | Attending: Physician Assistant | Admitting: Physician Assistant

## 2023-04-16 ENCOUNTER — Encounter: Payer: Self-pay | Admitting: Family

## 2023-04-16 VITALS — BP 115/74 | HR 95 | Ht 63.5 in | Wt 332.0 lb

## 2023-04-16 DIAGNOSIS — E669 Obesity, unspecified: Secondary | ICD-10-CM | POA: Insufficient documentation

## 2023-04-16 DIAGNOSIS — G4730 Sleep apnea, unspecified: Secondary | ICD-10-CM | POA: Insufficient documentation

## 2023-04-16 DIAGNOSIS — F17220 Nicotine dependence, chewing tobacco, uncomplicated: Secondary | ICD-10-CM | POA: Insufficient documentation

## 2023-04-16 DIAGNOSIS — I110 Hypertensive heart disease with heart failure: Secondary | ICD-10-CM | POA: Diagnosis not present

## 2023-04-16 DIAGNOSIS — Z6841 Body Mass Index (BMI) 40.0 and over, adult: Secondary | ICD-10-CM | POA: Diagnosis not present

## 2023-04-16 DIAGNOSIS — I10 Essential (primary) hypertension: Secondary | ICD-10-CM

## 2023-04-16 DIAGNOSIS — L97821 Non-pressure chronic ulcer of other part of left lower leg limited to breakdown of skin: Secondary | ICD-10-CM | POA: Insufficient documentation

## 2023-04-16 DIAGNOSIS — G4733 Obstructive sleep apnea (adult) (pediatric): Secondary | ICD-10-CM

## 2023-04-16 DIAGNOSIS — I509 Heart failure, unspecified: Secondary | ICD-10-CM | POA: Diagnosis present

## 2023-04-16 DIAGNOSIS — N183 Chronic kidney disease, stage 3 unspecified: Secondary | ICD-10-CM | POA: Insufficient documentation

## 2023-04-16 DIAGNOSIS — Q820 Hereditary lymphedema: Secondary | ICD-10-CM | POA: Insufficient documentation

## 2023-04-16 DIAGNOSIS — I890 Lymphedema, not elsewhere classified: Secondary | ICD-10-CM | POA: Diagnosis present

## 2023-04-16 DIAGNOSIS — L97811 Non-pressure chronic ulcer of other part of right lower leg limited to breakdown of skin: Secondary | ICD-10-CM | POA: Diagnosis present

## 2023-04-16 DIAGNOSIS — I130 Hypertensive heart and chronic kidney disease with heart failure and stage 1 through stage 4 chronic kidney disease, or unspecified chronic kidney disease: Secondary | ICD-10-CM | POA: Insufficient documentation

## 2023-04-16 DIAGNOSIS — I5032 Chronic diastolic (congestive) heart failure: Secondary | ICD-10-CM | POA: Diagnosis not present

## 2023-04-16 MED ORDER — WEGOVY 0.25 MG/0.5ML ~~LOC~~ SOAJ: 0.2500 mg | SUBCUTANEOUS | 0 refills | Status: DC

## 2023-04-16 NOTE — Patient Instructions (Signed)
It was good to see you today, keep up the great work!

## 2023-04-16 NOTE — Progress Notes (Signed)
PCP: [facility] (last seen 08/24) Primary Cardiologist: Marcina Millard, MD (last seen 04/24; returns 10/24)  HPI:  [name] is a 67 y/o female with a history of asthma, HTN, arthritis, lymphedema, CKD, secondary hyperparathyroidism, sleep apnea, anemia, spinal stenosis, obesity, previous tobacco use and chronic heart failure.   Admitted 05/10/22 due to worsening edema and drainage. Patient evaluated and found to have cellulitis of the right lower extremity. Had AKI. Vascular and wound consults obtained. Antibiotics started. Became symptomatic with elevated lactic acid and WBC. IVF given and antibiotic changed. CT scan 11/7 did not show any drainable fluid collection. Discharged after 6 days.   Echo 04/17/18: EF 65% with PA pressure of 43 mmHg Echo 04/13/19: EF 55-60% with mild LAE and trace MR Echo 11/01/21: EF of 60-65%.   She presents today for a HF follow-up visit with a chief complaint of moderate fatigue with minimal exertion. Chronic in nature. Has associated occasional palpitations, post-covid cough, lymphedema and difficulty sleeping due to sleeping too much during the day. She denies shortness of breath, chest pain, abdominal distention, dizziness or weight gain.   She started 0.25mg  wegovy on 04/01/23 and has lost >20 pounds and overall is feeling well. She says that she's not as hungry as she used to be but does experience episodes of nausea and bloating irregardless of what she eats/ drinks.   Oldest sister passed away this morning after chronic illness. Oldest brother is probably going under hospice care soon.   ROS: All systems negative except as listed in HPI, PMH and Problem List.  SH:  Social History   Socioeconomic History   Marital status: Single    Spouse name: Not on file   Number of children: Not on file   Years of education: Not on file   Highest education level: Not on file  Occupational History   Not on file  Tobacco Use   Smoking status: Former     Current packs/day: 0.00    Average packs/day: 0.3 packs/day for 14.0 years (3.5 ttl pk-yrs)    Types: Cigarettes    Start date: 07/09/1976    Quit date: 07/09/1990    Years since quitting: 32.7   Smokeless tobacco: Current    Types: Snuff  Vaping Use   Vaping status: Never Used  Substance and Sexual Activity   Alcohol use: No   Drug use: No   Sexual activity: Not Currently  Other Topics Concern   Not on file  Social History Narrative   Not on file   Social Determinants of Health   Financial Resource Strain: Not on file  Food Insecurity: No Food Insecurity (09/06/2022)   [facility]   Hunger Vital Sign    Worried About Running Out of Food in the Last Year: Never true    Ran Out of Food in the Last Year: Never true  Transportation Needs: No Transportation Needs (09/06/2022)   [facility]   [facility] - Transportation    Lack of Transportation (Medical): No    Lack of Transportation (Non-Medical): No  Physical Activity: Not on file  Stress: Not on file  Social Connections: Not on file  Intimate Partner Violence: Not At Risk (05/10/2022)   Humiliation, Afraid, Rape, and Kick questionnaire    Fear of Current or Ex-Partner: No    Emotionally Abused: No    Physically Abused: No    Sexually Abused: No    FH:  Family History  Problem Relation  Age of Onset   Breast cancer Maternal Aunt    Thyroid disease Other     Past Medical History:  Diagnosis Date   Arthritis    Asthma    CHF (congestive heart failure) (HCC)    Enlarged heart    Hypertension    Lymphedema    Sleep apnea    Spinal stenosis     Current Outpatient Medications  Medication Sig Dispense Refill   acetaminophen (TYLENOL) 500 MG tablet Take 500-1,000 mg by mouth every 6 (six) hours as needed for mild pain or fever.     albuterol (VENTOLIN HFA) 108 (90 Base) MCG/ACT inhaler Inhale 1-2 puffs into the lungs every 6 (six) hours as needed  for wheezing or shortness of breath.     bumetanide (BUMEX) 2 MG tablet Take 1 tablet (2 mg total) by mouth daily. Stop Furosemide 30 tablet 4   cetirizine (ZYRTEC) 10 MG tablet Take 1 tablet (10 mg total) by mouth daily. (Patient taking differently: Take 10 mg by mouth daily as needed for allergies.) 60 tablet 0   FARXIGA 10 MG TABS tablet Take 1 tablet (10 mg total) by mouth daily before breakfast. 30 tablet 4   KLOR-CON M20 20 MEQ tablet Take 2 tablets (40 mEq total) by mouth daily. 180 tablet 2   metolazone (ZAROXOLYN) 5 MG tablet Take 0.5 tablets (2.5 mg total) by mouth 2 (two) times a week. 12 tablet 2   mupirocin ointment (BACTROBAN) 2 % Apply 1 Application topically daily as needed (wound care).     NYSTATIN powder Apply 1 Application topically 2 (two) times daily as needed (Yeast Skin infection).     Semaglutide-Weight Management (WEGOVY) 0.25 MG/0.5ML SOAJ Inject 0.25 mg into the skin once a week. 2 mL 0   senna-docusate (SENOKOT-S) 8.6-50 MG tablet Take 1 tablet by mouth 2 (two) times daily as needed for mild constipation or moderate constipation.     spironolactone (ALDACTONE) 25 MG tablet TAKE 0.5 TABLET BY MOUTH ONCE DAILY. 15 tablet 6   No current facility-administered medications for this visit.   Vitals:   04/16/23 1117  BP: 115/74  Pulse: 95  SpO2: 99%  Weight: (!) 332 lb (150.6 kg)  Height: 5' 3.5" (1.613 m)   Wt Readings from Last 3 Encounters:  04/16/23 (!) 332 lb (150.6 kg)  03/05/23 (!) 355 lb (161 kg)  11/27/22 (!) 361 lb (163.7 kg)   Lab Results  Component Value Date   CREATININE 1.81 (H) 08/31/2022   CREATININE 1.28 (H) 08/03/2022   CREATININE 1.32 (H) 07/23/2022     PHYSICAL EXAM:  General:  Well appearing. No resp difficulty HEENT: normal Neck: supple. JVP flat. No lymphadenopathy or thryomegaly appreciated. Cor: PMI normal. Regular rate & rhythm. No rubs, gallops or murmurs. Lungs: clear Abdomen: soft, nontender, nondistended. No  hepatosplenomegaly. No bruits or masses.  Extremities: no cyanosis, clubbing, rash. + bilateral lymphedema Neuro: alert & oriented x3, cranial nerves grossly intact. Moves all 4 extremities w/o difficulty. Affect pleasant.   ECG: not done   ASSESSMENT & PLAN:  1: Chronic heart failure with preserved ejection fraction- - likely HTN/ sleep apnea - NYHA class III - euvolemic today - has attempted  to weigh daily with standing on 2 scales at home but it fluctuates too much - weight down 23 pounds from last visit here 6 weeks ago - Echo 04/17/18: EF 65% with PA pressure of 43 mmHg - Echo 04/13/19: EF 55-60% with mild LAE and trace MR -  Echo 11/01/21: EF of 60-65%. - reviewed the importance of keeping her sodium 2000mg  as well as keeping daily fluid intake to 60-64 ounces/ day - facial/mouth swelling with ACEi - continue farxiga 10mg   - continue spironolactone 12.5mg  daily; unsure if BP could tolerate titrating this to 25mg  daily - continue bumex 2mg  QOD - continue metolazone 2.5mg  twice weekly - continue potassium daily - saw cardiology Mellissa Kohut) 04/24 - BNP 10/31/21 was 123.9  2: HTN with stage 3 CKD- - BP 115/74 - sees PCP Jesse Sans) @ [facility]  - saw nephrology Cherylann Ratel) 07/24  - BMP 01/08/23 reviewed and showed sodium 137, potassium 4.2, creatinine 1.59 and GFR 35  3: Lymphedema- - saw wound center earlier today  4: Sleep apnea- - not wearing CPAP nightly due to having water in the mask; insurance will not allow another CPAP machine until 2025 - saw pulmonology Welton Flakes) 04/24  5: Morbid obesity- - BMI 11/27/22 was 61.90  - wegovy 0.25mg  started 04/01/23 - experiences nausea and bloating at times irregardless of what she eats/ drinks - will defer titration until next month and she is in agreement - unable to attend [place] pharmacy clinic due to transportation issues  Return in 1 month, sooner if needed.

## 2023-04-16 NOTE — Progress Notes (Addendum)
evidence of infection or worsening. Based on what I am seeing I do believe that were making good headway towards closure. 2. I am good recommend as well the patient should continue with the mupirocin to any open areas as needed although she is not really needing a lot of that at this point. 3. I am going to recommend that she continue to use her pumps twice a day and elevate her legs is much as possible sleeping in the bed always helps her a lot she seems to be doing well at the moment. We will see patient back for reevaluation in 1 week here in the clinic. If anything worsens or changes patient will contact our office for additional recommendations. Electronic Signature(s) Signed: 04/16/2023 11:08:56 AM By: Elizabeth Derry PA-C Entered By: [name] on 04/16/2023 11:08:56 -------------------------------------------------------------------------------- SuperBill Details Patient Name: Date of Service: [name], [name] 04/16/2023 Medical Record Number: [id number] Patient Account Number: [id number] Date of Birth/Sex: Treating RN: February 15, 1956 (67 y.o. 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 Facility Procedures : CPT4 Code: 40981191 Description: 47829 - WOUND CARE VISIT-LEV 2 EST PT Modifier: Quantity: 1 Physician Procedures : CPT4 Code Description Modifier [id number] 99213 - WC PHYS LEVEL 3 - EST PT Elizabeth, [name] ([id number]) [id number] Page ICD-10 Diagnosis Description Q82.0 Hereditary lymphedema L97.811 Non-pressure  chronic ulcer of other part  of right lower leg limited to breakdown of skin L97.821 Non-pressure chronic ulcer of other part of left lower leg limited to breakdown of skin Quantity: 1 15 of 15 Electronic Signature(s) Signed: 04/16/2023 11:09:13 AM By: Elizabeth Derry PA-C Entered By: [name] on 04/16/2023 11:09:13  [name], [name] ([id number]) [id number] Page 1 of 15 Visit Report for 04/16/2023 Chief Complaint Document Details Patient Name: Date of Service: [name], [name] 04/16/2023 10:00 A M Medical Record Number: [id number] Patient Account Number: [id number] Date of Birth/Sex: Treating RN: Nov 21, 1955 (67 y.o. [name] Primary Care Provider: [name] [name] Other Clinician: Betha [name] Referring Provider: Treating Provider/Extender: [name] in Treatment: 61 Information Obtained from: Patient Chief Complaint Bilateral LE lymphedema with Left LE Ulcers Electronic Signature(s) Signed: 04/16/2023 10:06:36 AM By: Elizabeth Derry PA-C Entered By: [name] on 04/16/2023 10:06:36 -------------------------------------------------------------------------------- HPI Details Patient Name: Date of Service: [name]. 04/16/2023 10:00 A M Medical Record Number: [id number] Patient Account Number: [id number] Date of Birth/Sex: Treating RN: 04/07/1956 (67 y.o. [name] Primary Care Provider: [name] [name] Other Clinician: Betha [name] Referring Provider: Treating Provider/Extender: [name] in Treatment: 60 History of Present Illness HPI Description: The patient is a 67 year old female with history of hypertension and a long-standing history of bilateral lower extremity lymphedema (first presented on 4/2) . She has had open ulcers in the past which have always responded to compression therapy. She had briefly been to a lymphedema clinic in the past which helped her at the time. this time around she stopped treatment of her lymphedema pumps approximately 2 weeks ago because of some pain in the knees and then noticed the right leg getting worse. She was seen by her PCP who put her on clindamycin 4 times a day 2 days ago. The patient has seen [facility] and Dr. Gilda [name] had seen her last year where a vascular study  including venous and arterial duplex studies were within normal limits. he had recommended compression stockings and lymphedema pumps and the patient has been using this in about 2 weeks ago. She is known to be diabetic but in the past few time she's gone to her primary care doctor her hemoglobin A1c has been normal. 02/11/2015 - after her last visit she took my advice and went to the ER regarding the progressive cellulitis of her right lower extremity and she was admitted between July 17 and 22nd. She received IV antibiotics and then was sent home on a course of steroid-induced and oral antibiotics. She has improved much since then. 02/17/2015 -- she has been doing fine and the weeping of her legs has remarkably gone down. She has no fresh issues. Elizabeth, [name] ([id number]) [id number] Page 2 of 15 01/15/18 This patient was given this clinic before most recently in 2016 seen by Dr. Meyer [name]. She has massive bilateral lymphedema and over the last 2 months this had weeping edema out of the left leg. She has compression pumps but her compliance with these has been minimal. She has [facility] they've been using TCA/ABDs/kerlix under an Ace wrap.she has had recent problems with cellulitis. She was apparently seen in the ER and 12/23/17 and given clindamycin. She was then followed by her primary doctor and given doxycycline and Keflex. The pain seems to have settled down. In April 2018 the patient had arterial studies done at [facility]. This showed triphasic waveforms throughout the right leg and mostly triphasic waveforms on the left except for monophasic at the posterior tibial artery distally. She was not felt to have evidence of right lower extremity arterial stenosis or significant problems on the left side. 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 She also had a right lower extremity  as needed Topical A Electronic Signature(s) Signed: 04/16/2023 11:40:05 AM By: Elizabeth Derry PA-C Signed: 04/16/2023 1:41:37 PM By: [name] Entered By: [name] on 04/16/2023 10:59:20 Problem List Details -------------------------------------------------------------------------------- [name] ([id number]) [id number] Page 8 of 15 Patient Name: Date of Service: [name], [name] 04/16/2023 10:00 A M Medical Record Number: [id number] Patient Account Number: [id number] Date of Birth/Sex: Treating RN: 15-Dec-1955 (67 y.o. 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 04/16/2023 10:00 A M Medical Record Number: [id number] Patient Account Number: [id number] Date of Birth/Sex: Treating RN: 1956/01/28 (67 y.o. [name] Primary Care Provider: [name] [name] Other Clinician: Betha [name] Referring Provider: Treating Provider/Extender: [name] in Treatment: 61 Subjective Chief Complaint Information obtained from Patient Bilateral  LE lymphedema with Left LE Ulcers History of Present Illness (HPI) The patient is a 67 year old female with history of hypertension and a long-standing history of bilateral lower extremity lymphedema (first presented on 4/2) . She has had open ulcers in the past which have always responded to compression therapy. She had briefly been to a lymphedema clinic in the past which helped her at the time. this time around she stopped treatment of her lymphedema pumps approximately 2 weeks ago because of some pain in the knees and then noticed the right leg getting worse. She was seen by her PCP who put her on clindamycin 4 times a day 2 days ago. The patient has seen [facility] and Dr. Gilda [name] had seen her last year where a vascular study including venous and arterial duplex studies were within normal limits. he had recommended compression stockings and lymphedema pumps and the patient has been using this in about 2 weeks ago. She is known to be diabetic but in the past few time she's gone to her primary care doctor her hemoglobin A1c has been normal. 02/11/2015 - after her last visit she took my advice and went to the ER regarding the progressive cellulitis of her right lower extremity and she was admitted VOLA, BENEKE ([id number]) [id number] Page 9 of 15 between July 17 and 22nd. She received IV antibiotics and then was sent home on a course of steroid-induced and oral antibiotics. She has improved much since then. 02/17/2015 -- she has been doing fine and the weeping of her legs has remarkably gone down. She has no fresh issues. READMISSION 01/15/18 This patient was given this clinic before most recently in 2016 seen by Dr. Meyer [name]. She has massive bilateral lymphedema and over the last 2 months this had weeping edema out of the left leg. She has compression pumps but her compliance with these has been minimal. She has [facility] they've been using TCA/ABDs/kerlix under  an Ace wrap.she has had recent problems with cellulitis. She was apparently seen in the ER and 12/23/17 and given clindamycin. She was then followed by her primary doctor and given doxycycline and  evidence of infection or worsening. Based on what I am seeing I do believe that were making good headway towards closure. 2. I am good recommend as well the patient should continue with the mupirocin to any open areas as needed although she is not really needing a lot of that at this point. 3. I am going to recommend that she continue to use her pumps twice a day and elevate her legs is much as possible sleeping in the bed always helps her a lot she seems to be doing well at the moment. We will see patient back for reevaluation in 1 week here in the clinic. If anything worsens or changes patient will contact our office for additional recommendations. Electronic Signature(s) Signed: 04/16/2023 11:08:56 AM By: Elizabeth Derry PA-C Entered By: [name] on 04/16/2023 11:08:56 -------------------------------------------------------------------------------- SuperBill Details Patient Name: Date of Service: [name], [name] 04/16/2023 Medical Record Number: [id number] Patient Account Number: [id number] Date of Birth/Sex: Treating RN: February 15, 1956 (67 y.o. 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 Facility Procedures : CPT4 Code: 40981191 Description: 47829 - WOUND CARE VISIT-LEV 2 EST PT Modifier: Quantity: 1 Physician Procedures : CPT4 Code Description Modifier [id number] 99213 - WC PHYS LEVEL 3 - EST PT Elizabeth, [name] ([id number]) [id number] Page ICD-10 Diagnosis Description 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 Based on what I am seeing I do believe that were making good headway towards closure. 2. I am good recommend as well the patient should continue with the mupirocin to any open areas as needed although she is not really needing a lot of that at this point. 3. I am going to recommend that she continue to use her pumps twice a day and elevate her legs is much as possible sleeping in the bed always helps her a lot she seems to be doing well at the moment. We will see patient back for reevaluation in 1 week here in the clinic. If anything worsens or changes patient will contact our office for additional recommendations. Electronic Signature(s) Signed: 04/16/2023 11:08:56 AM By: Elizabeth Derry PA-C Entered By: [name] on 04/16/2023 11:08:56 -------------------------------------------------------------------------------- SuperBill Details Patient Name: Date of Service: [name], [name] 04/16/2023 Medical Record Number: [id number] Patient Account Number: [id number] Date of Birth/Sex: Treating RN: February 15, 1956 (67 y.o. 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 Facility Procedures : CPT4 Code: 40981191 Description: 47829 - WOUND CARE VISIT-LEV 2 EST PT Modifier: Quantity: 1 Physician Procedures : CPT4 Code Description Modifier [id number] 99213 - WC PHYS LEVEL 3 - EST PT Elizabeth, [name] ([id number]) [id number] Page ICD-10 Diagnosis Description Q82.0 Hereditary lymphedema L97.811 Non-pressure  chronic ulcer of other part  of right lower leg limited to breakdown of skin L97.821 Non-pressure chronic ulcer of other part of left lower leg limited to breakdown of skin Quantity: 1 15 of 15 Electronic Signature(s) Signed: 04/16/2023 11:09:13 AM By: Elizabeth Derry PA-C Entered By: [name] on 04/16/2023 11:09:13  do this on the right leg she has a special type of wrap that she uses from her mid calf up to her knee. She did not have any obvious open wounds but are intake nurse did notice the weeping lower area at on the right just above her ankle 04-16-2023 upon evaluation today patient appears to be doing well currently she does not have any open wounds at this point which is great news and in general I do believe that we are making really good headway towards closure. Fortunately she is doing great today. Unfortunately she did have a death in the family her older sister and she tells me that her other sister is on hospice. Electronic Signature(s) Signed: 04/16/2023 11:07:59 AM By: Elizabeth Derry PA-C Entered By: [name] on 04/16/2023 11:07:58 -------------------------------------------------------------------------------- Physical Exam Details Patient Name: Date of Service: [name], [name] 04/16/2023 10:00 A M Medical Record Number: [id number] Patient Account Number: [id number] Date of Birth/Sex: Treating RN: 02-Sep-1955 (67 y.o. [name] Primary Care Provider: [name] [name] Other Clinician: Betha [name] Referring Provider: Treating Provider/Extender: [name] in Treatment: 27 Constitutional Obese and well-hydrated in no acute distress. Respiratory normal breathing without difficulty. Psychiatric this patient is able to make decisions and demonstrates good insight into disease process. Alert and Oriented x 3. pleasant and cooperative. Notes Upon inspection patient's wound bed actually showed signs of being completely closed nothing is open on the legs right now and she is doing quite well. I am actually very pleased with where things stand I do not see any signs of infection and in general I believe that she is in good shape here. She has started [facility] and she is hoping to lose some weight as well. Electronic Signature(s) Signed:  04/16/2023 11:08:23 AM By: Elizabeth Derry PA-C Entered By: [name] on 04/16/2023 11:08:23 [name] ([id number]) [id number] Page 7 of 15 -------------------------------------------------------------------------------- Physician Orders Details Patient Name: Date of Service: [name], [name] 04/16/2023 10:00 A M Medical Record Number: [id number] Patient Account Number: [id number] Date of Birth/Sex: Treating RN: 04-Aug-1955 (67 y.o. 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 Wash wounds with antibacterial soap and water. May shower; gently cleanse wound with antibacterial soap, rinse and pat dry prior to dressing wounds No tub bath. Edema Control - Lymphedema / Segmental Compressive Device / Other Elevate, Exercise Daily and A void Standing for Long Periods of Time. Elevate leg(s) parallel to the floor when sitting. DO YOUR BEST to sleep in the bed at night. DO NOT sleep in your recliner. Long hours of sitting in a recliner leads to swelling of the legs and/or potential wounds on your backside. Other: - use lymphedema pumps at least twice daily wrap RLE from knee down with ace wrap use velcro wrap to upper calf area Non-Wound Condition Bilateral Lower Extremities Cleanse affected area with antibacterial soap and  water, Medications-Please add to medication list. ntibiotic - apply Mupirocin to weepy areas  [name], [name] ([id number]) [id number] Page 1 of 15 Visit Report for 04/16/2023 Chief Complaint Document Details Patient Name: Date of Service: [name], [name] 04/16/2023 10:00 A M Medical Record Number: [id number] Patient Account Number: [id number] Date of Birth/Sex: Treating RN: Nov 21, 1955 (67 y.o. [name] Primary Care Provider: [name] [name] Other Clinician: Betha [name] Referring Provider: Treating Provider/Extender: [name] in Treatment: 61 Information Obtained from: Patient Chief Complaint Bilateral LE lymphedema with Left LE Ulcers Electronic Signature(s) Signed: 04/16/2023 10:06:36 AM By: Elizabeth Derry PA-C Entered By: [name] on 04/16/2023 10:06:36 -------------------------------------------------------------------------------- HPI Details Patient Name: Date of Service: [name]. 04/16/2023 10:00 A M Medical Record Number: [id number] Patient Account Number: [id number] Date of Birth/Sex: Treating RN: 04/07/1956 (67 y.o. [name] Primary Care Provider: [name] [name] Other Clinician: Betha [name] Referring Provider: Treating Provider/Extender: [name] in Treatment: 60 History of Present Illness HPI Description: The patient is a 67 year old female with history of hypertension and a long-standing history of bilateral lower extremity lymphedema (first presented on 4/2) . She has had open ulcers in the past which have always responded to compression therapy. She had briefly been to a lymphedema clinic in the past which helped her at the time. this time around she stopped treatment of her lymphedema pumps approximately 2 weeks ago because of some pain in the knees and then noticed the right leg getting worse. She was seen by her PCP who put her on clindamycin 4 times a day 2 days ago. The patient has seen [facility] and Dr. Gilda [name] had seen her last year where a vascular study  including venous and arterial duplex studies were within normal limits. he had recommended compression stockings and lymphedema pumps and the patient has been using this in about 2 weeks ago. She is known to be diabetic but in the past few time she's gone to her primary care doctor her hemoglobin A1c has been normal. 02/11/2015 - after her last visit she took my advice and went to the ER regarding the progressive cellulitis of her right lower extremity and she was admitted between July 17 and 22nd. She received IV antibiotics and then was sent home on a course of steroid-induced and oral antibiotics. She has improved much since then. 02/17/2015 -- she has been doing fine and the weeping of her legs has remarkably gone down. She has no fresh issues. Elizabeth, [name] ([id number]) [id number] Page 2 of 15 01/15/18 This patient was given this clinic before most recently in 2016 seen by Dr. Meyer [name]. She has massive bilateral lymphedema and over the last 2 months this had weeping edema out of the left leg. She has compression pumps but her compliance with these has been minimal. She has [facility] they've been using TCA/ABDs/kerlix under an Ace wrap.she has had recent problems with cellulitis. She was apparently seen in the ER and 12/23/17 and given clindamycin. She was then followed by her primary doctor and given doxycycline and Keflex. The pain seems to have settled down. In April 2018 the patient had arterial studies done at [facility]. This showed triphasic waveforms throughout the right leg and mostly triphasic waveforms on the left except for monophasic at the posterior tibial artery distally. She was not felt to have evidence of right lower extremity arterial stenosis or significant problems on the left side. 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 She also had a right lower extremity  as needed Topical A Electronic Signature(s) Signed: 04/16/2023 11:40:05 AM By: Elizabeth Derry PA-C Signed: 04/16/2023 1:41:37 PM By: [name] Entered By: [name] on 04/16/2023 10:59:20 Problem List Details -------------------------------------------------------------------------------- [name] ([id number]) [id number] Page 8 of 15 Patient Name: Date of Service: [name], [name] 04/16/2023 10:00 A M Medical Record Number: [id number] Patient Account Number: [id number] Date of Birth/Sex: Treating RN: 15-Dec-1955 (67 y.o. 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 04/16/2023 10:00 A M Medical Record Number: [id number] Patient Account Number: [id number] Date of Birth/Sex: Treating RN: 1956/01/28 (67 y.o. [name] Primary Care Provider: [name] [name] Other Clinician: Betha [name] Referring Provider: Treating Provider/Extender: [name] in Treatment: 61 Subjective Chief Complaint Information obtained from Patient Bilateral  LE lymphedema with Left LE Ulcers History of Present Illness (HPI) The patient is a 67 year old female with history of hypertension and a long-standing history of bilateral lower extremity lymphedema (first presented on 4/2) . She has had open ulcers in the past which have always responded to compression therapy. She had briefly been to a lymphedema clinic in the past which helped her at the time. this time around she stopped treatment of her lymphedema pumps approximately 2 weeks ago because of some pain in the knees and then noticed the right leg getting worse. She was seen by her PCP who put her on clindamycin 4 times a day 2 days ago. The patient has seen [facility] and Dr. Gilda [name] had seen her last year where a vascular study including venous and arterial duplex studies were within normal limits. he had recommended compression stockings and lymphedema pumps and the patient has been using this in about 2 weeks ago. She is known to be diabetic but in the past few time she's gone to her primary care doctor her hemoglobin A1c has been normal. 02/11/2015 - after her last visit she took my advice and went to the ER regarding the progressive cellulitis of her right lower extremity and she was admitted VOLA, BENEKE ([id number]) [id number] Page 9 of 15 between July 17 and 22nd. She received IV antibiotics and then was sent home on a course of steroid-induced and oral antibiotics. She has improved much since then. 02/17/2015 -- she has been doing fine and the weeping of her legs has remarkably gone down. She has no fresh issues. READMISSION 01/15/18 This patient was given this clinic before most recently in 2016 seen by Dr. Meyer [name]. She has massive bilateral lymphedema and over the last 2 months this had weeping edema out of the left leg. She has compression pumps but her compliance with these has been minimal. She has [facility] they've been using TCA/ABDs/kerlix under  an Ace wrap.she has had recent problems with cellulitis. She was apparently seen in the ER and 12/23/17 and given clindamycin. She was then followed by her primary doctor and given doxycycline and  do this on the right leg she has a special type of wrap that she uses from her mid calf up to her knee. She did not have any obvious open wounds but are intake nurse did notice the weeping lower area at on the right just above her ankle 04-16-2023 upon evaluation today patient appears to be doing well currently she does not have any open wounds at this point which is great news and in general I do believe that we are making really good headway towards closure. Fortunately she is doing great today. Unfortunately she did have a death in the family her older sister and she tells me that her other sister is on hospice. Electronic Signature(s) Signed: 04/16/2023 11:07:59 AM By: Elizabeth Derry PA-C Entered By: [name] on 04/16/2023 11:07:58 -------------------------------------------------------------------------------- Physical Exam Details Patient Name: Date of Service: [name], [name] 04/16/2023 10:00 A M Medical Record Number: [id number] Patient Account Number: [id number] Date of Birth/Sex: Treating RN: 02-Sep-1955 (67 y.o. [name] Primary Care Provider: [name] [name] Other Clinician: Betha [name] Referring Provider: Treating Provider/Extender: [name] in Treatment: 27 Constitutional Obese and well-hydrated in no acute distress. Respiratory normal breathing without difficulty. Psychiatric this patient is able to make decisions and demonstrates good insight into disease process. Alert and Oriented x 3. pleasant and cooperative. Notes Upon inspection patient's wound bed actually showed signs of being completely closed nothing is open on the legs right now and she is doing quite well. I am actually very pleased with where things stand I do not see any signs of infection and in general I believe that she is in good shape here. She has started [facility] and she is hoping to lose some weight as well. Electronic Signature(s) Signed:  04/16/2023 11:08:23 AM By: Elizabeth Derry PA-C Entered By: [name] on 04/16/2023 11:08:23 [name] ([id number]) [id number] Page 7 of 15 -------------------------------------------------------------------------------- Physician Orders Details Patient Name: Date of Service: [name], [name] 04/16/2023 10:00 A M Medical Record Number: [id number] Patient Account Number: [id number] Date of Birth/Sex: Treating RN: 04-Aug-1955 (67 y.o. 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 Wash wounds with antibacterial soap and water. May shower; gently cleanse wound with antibacterial soap, rinse and pat dry prior to dressing wounds No tub bath. Edema Control - Lymphedema / Segmental Compressive Device / Other Elevate, Exercise Daily and A void Standing for Long Periods of Time. Elevate leg(s) parallel to the floor when sitting. DO YOUR BEST to sleep in the bed at night. DO NOT sleep in your recliner. Long hours of sitting in a recliner leads to swelling of the legs and/or potential wounds on your backside. Other: - use lymphedema pumps at least twice daily wrap RLE from knee down with ace wrap use velcro wrap to upper calf area Non-Wound Condition Bilateral Lower Extremities Cleanse affected area with antibacterial soap and  water, Medications-Please add to medication list. ntibiotic - apply Mupirocin to weepy areas  do this on the right leg she has a special type of wrap that she uses from her mid calf up to her knee. She did not have any obvious open wounds but are intake nurse did notice the weeping lower area at on the right just above her ankle 04-16-2023 upon evaluation today patient appears to be doing well currently she does not have any open wounds at this point which is great news and in general I do believe that we are making really good headway towards closure. Fortunately she is doing great today. Unfortunately she did have a death in the family her older sister and she tells me that her other sister is on hospice. Electronic Signature(s) Signed: 04/16/2023 11:07:59 AM By: Elizabeth Derry PA-C Entered By: [name] on 04/16/2023 11:07:58 -------------------------------------------------------------------------------- Physical Exam Details Patient Name: Date of Service: [name], [name] 04/16/2023 10:00 A M Medical Record Number: [id number] Patient Account Number: [id number] Date of Birth/Sex: Treating RN: 02-Sep-1955 (67 y.o. [name] Primary Care Provider: [name] [name] Other Clinician: Betha [name] Referring Provider: Treating Provider/Extender: [name] in Treatment: 27 Constitutional Obese and well-hydrated in no acute distress. Respiratory normal breathing without difficulty. Psychiatric this patient is able to make decisions and demonstrates good insight into disease process. Alert and Oriented x 3. pleasant and cooperative. Notes Upon inspection patient's wound bed actually showed signs of being completely closed nothing is open on the legs right now and she is doing quite well. I am actually very pleased with where things stand I do not see any signs of infection and in general I believe that she is in good shape here. She has started [facility] and she is hoping to lose some weight as well. Electronic Signature(s) Signed:  04/16/2023 11:08:23 AM By: Elizabeth Derry PA-C Entered By: [name] on 04/16/2023 11:08:23 [name] ([id number]) [id number] Page 7 of 15 -------------------------------------------------------------------------------- Physician Orders Details Patient Name: Date of Service: [name], [name] 04/16/2023 10:00 A M Medical Record Number: [id number] Patient Account Number: [id number] Date of Birth/Sex: Treating RN: 04-Aug-1955 (67 y.o. 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 Wash wounds with antibacterial soap and water. May shower; gently cleanse wound with antibacterial soap, rinse and pat dry prior to dressing wounds No tub bath. Edema Control - Lymphedema / Segmental Compressive Device / Other Elevate, Exercise Daily and A void Standing for Long Periods of Time. Elevate leg(s) parallel to the floor when sitting. DO YOUR BEST to sleep in the bed at night. DO NOT sleep in your recliner. Long hours of sitting in a recliner leads to swelling of the legs and/or potential wounds on your backside. Other: - use lymphedema pumps at least twice daily wrap RLE from knee down with ace wrap use velcro wrap to upper calf area Non-Wound Condition Bilateral Lower Extremities Cleanse affected area with antibacterial soap and  water, Medications-Please add to medication list. ntibiotic - apply Mupirocin to weepy areas  [name], [name] ([id number]) [id number] Page 1 of 15 Visit Report for 04/16/2023 Chief Complaint Document Details Patient Name: Date of Service: [name], [name] 04/16/2023 10:00 A M Medical Record Number: [id number] Patient Account Number: [id number] Date of Birth/Sex: Treating RN: Nov 21, 1955 (67 y.o. [name] Primary Care Provider: [name] [name] Other Clinician: Betha [name] Referring Provider: Treating Provider/Extender: [name] in Treatment: 61 Information Obtained from: Patient Chief Complaint Bilateral LE lymphedema with Left LE Ulcers Electronic Signature(s) Signed: 04/16/2023 10:06:36 AM By: Elizabeth Derry PA-C Entered By: [name] on 04/16/2023 10:06:36 -------------------------------------------------------------------------------- HPI Details Patient Name: Date of Service: [name]. 04/16/2023 10:00 A M Medical Record Number: [id number] Patient Account Number: [id number] Date of Birth/Sex: Treating RN: 04/07/1956 (67 y.o. [name] Primary Care Provider: [name] [name] Other Clinician: Betha [name] Referring Provider: Treating Provider/Extender: [name] in Treatment: 60 History of Present Illness HPI Description: The patient is a 67 year old female with history of hypertension and a long-standing history of bilateral lower extremity lymphedema (first presented on 4/2) . She has had open ulcers in the past which have always responded to compression therapy. She had briefly been to a lymphedema clinic in the past which helped her at the time. this time around she stopped treatment of her lymphedema pumps approximately 2 weeks ago because of some pain in the knees and then noticed the right leg getting worse. She was seen by her PCP who put her on clindamycin 4 times a day 2 days ago. The patient has seen [facility] and Dr. Gilda [name] had seen her last year where a vascular study  including venous and arterial duplex studies were within normal limits. he had recommended compression stockings and lymphedema pumps and the patient has been using this in about 2 weeks ago. She is known to be diabetic but in the past few time she's gone to her primary care doctor her hemoglobin A1c has been normal. 02/11/2015 - after her last visit she took my advice and went to the ER regarding the progressive cellulitis of her right lower extremity and she was admitted between July 17 and 22nd. She received IV antibiotics and then was sent home on a course of steroid-induced and oral antibiotics. She has improved much since then. 02/17/2015 -- she has been doing fine and the weeping of her legs has remarkably gone down. She has no fresh issues. Elizabeth, [name] ([id number]) [id number] Page 2 of 15 01/15/18 This patient was given this clinic before most recently in 2016 seen by Dr. Meyer [name]. She has massive bilateral lymphedema and over the last 2 months this had weeping edema out of the left leg. She has compression pumps but her compliance with these has been minimal. She has [facility] they've been using TCA/ABDs/kerlix under an Ace wrap.she has had recent problems with cellulitis. She was apparently seen in the ER and 12/23/17 and given clindamycin. She was then followed by her primary doctor and given doxycycline and Keflex. The pain seems to have settled down. In April 2018 the patient had arterial studies done at [facility]. This showed triphasic waveforms throughout the right leg and mostly triphasic waveforms on the left except for monophasic at the posterior tibial artery distally. She was not felt to have evidence of right lower extremity arterial stenosis or significant problems on the left side. 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 She also had a right lower extremity  as needed Topical A Electronic Signature(s) Signed: 04/16/2023 11:40:05 AM By: Elizabeth Derry PA-C Signed: 04/16/2023 1:41:37 PM By: [name] Entered By: [name] on 04/16/2023 10:59:20 Problem List Details -------------------------------------------------------------------------------- [name] ([id number]) [id number] Page 8 of 15 Patient Name: Date of Service: [name], [name] 04/16/2023 10:00 A M Medical Record Number: [id number] Patient Account Number: [id number] Date of Birth/Sex: Treating RN: 15-Dec-1955 (67 y.o. 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 04/16/2023 10:00 A M Medical Record Number: [id number] Patient Account Number: [id number] Date of Birth/Sex: Treating RN: 1956/01/28 (67 y.o. [name] Primary Care Provider: [name] [name] Other Clinician: Betha [name] Referring Provider: Treating Provider/Extender: [name] in Treatment: 61 Subjective Chief Complaint Information obtained from Patient Bilateral  LE lymphedema with Left LE Ulcers History of Present Illness (HPI) The patient is a 67 year old female with history of hypertension and a long-standing history of bilateral lower extremity lymphedema (first presented on 4/2) . She has had open ulcers in the past which have always responded to compression therapy. She had briefly been to a lymphedema clinic in the past which helped her at the time. this time around she stopped treatment of her lymphedema pumps approximately 2 weeks ago because of some pain in the knees and then noticed the right leg getting worse. She was seen by her PCP who put her on clindamycin 4 times a day 2 days ago. The patient has seen [facility] and Dr. Gilda [name] had seen her last year where a vascular study including venous and arterial duplex studies were within normal limits. he had recommended compression stockings and lymphedema pumps and the patient has been using this in about 2 weeks ago. She is known to be diabetic but in the past few time she's gone to her primary care doctor her hemoglobin A1c has been normal. 02/11/2015 - after her last visit she took my advice and went to the ER regarding the progressive cellulitis of her right lower extremity and she was admitted VOLA, BENEKE ([id number]) [id number] Page 9 of 15 between July 17 and 22nd. She received IV antibiotics and then was sent home on a course of steroid-induced and oral antibiotics. She has improved much since then. 02/17/2015 -- she has been doing fine and the weeping of her legs has remarkably gone down. She has no fresh issues. READMISSION 01/15/18 This patient was given this clinic before most recently in 2016 seen by Dr. Meyer [name]. She has massive bilateral lymphedema and over the last 2 months this had weeping edema out of the left leg. She has compression pumps but her compliance with these has been minimal. She has [facility] they've been using TCA/ABDs/kerlix under  an Ace wrap.she has had recent problems with cellulitis. She was apparently seen in the ER and 12/23/17 and given clindamycin. She was then followed by her primary doctor and given doxycycline and  as needed Topical A Electronic Signature(s) Signed: 04/16/2023 11:40:05 AM By: Elizabeth Derry PA-C Signed: 04/16/2023 1:41:37 PM By: [name] Entered By: [name] on 04/16/2023 10:59:20 Problem List Details -------------------------------------------------------------------------------- [name] ([id number]) [id number] Page 8 of 15 Patient Name: Date of Service: [name], [name] 04/16/2023 10:00 A M Medical Record Number: [id number] Patient Account Number: [id number] Date of Birth/Sex: Treating RN: 15-Dec-1955 (67 y.o. 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 04/16/2023 10:00 A M Medical Record Number: [id number] Patient Account Number: [id number] Date of Birth/Sex: Treating RN: 1956/01/28 (67 y.o. [name] Primary Care Provider: [name] [name] Other Clinician: Betha [name] Referring Provider: Treating Provider/Extender: [name] in Treatment: 61 Subjective Chief Complaint Information obtained from Patient Bilateral  LE lymphedema with Left LE Ulcers History of Present Illness (HPI) The patient is a 67 year old female with history of hypertension and a long-standing history of bilateral lower extremity lymphedema (first presented on 4/2) . She has had open ulcers in the past which have always responded to compression therapy. She had briefly been to a lymphedema clinic in the past which helped her at the time. this time around she stopped treatment of her lymphedema pumps approximately 2 weeks ago because of some pain in the knees and then noticed the right leg getting worse. She was seen by her PCP who put her on clindamycin 4 times a day 2 days ago. The patient has seen [facility] and Dr. Gilda [name] had seen her last year where a vascular study including venous and arterial duplex studies were within normal limits. he had recommended compression stockings and lymphedema pumps and the patient has been using this in about 2 weeks ago. She is known to be diabetic but in the past few time she's gone to her primary care doctor her hemoglobin A1c has been normal. 02/11/2015 - after her last visit she took my advice and went to the ER regarding the progressive cellulitis of her right lower extremity and she was admitted VOLA, BENEKE ([id number]) [id number] Page 9 of 15 between July 17 and 22nd. She received IV antibiotics and then was sent home on a course of steroid-induced and oral antibiotics. She has improved much since then. 02/17/2015 -- she has been doing fine and the weeping of her legs has remarkably gone down. She has no fresh issues. READMISSION 01/15/18 This patient was given this clinic before most recently in 2016 seen by Dr. Meyer [name]. She has massive bilateral lymphedema and over the last 2 months this had weeping edema out of the left leg. She has compression pumps but her compliance with these has been minimal. She has [facility] they've been using TCA/ABDs/kerlix under  an Ace wrap.she has had recent problems with cellulitis. She was apparently seen in the ER and 12/23/17 and given clindamycin. She was then followed by her primary doctor and given doxycycline and  do this on the right leg she has a special type of wrap that she uses from her mid calf up to her knee. She did not have any obvious open wounds but are intake nurse did notice the weeping lower area at on the right just above her ankle 04-16-2023 upon evaluation today patient appears to be doing well currently she does not have any open wounds at this point which is great news and in general I do believe that we are making really good headway towards closure. Fortunately she is doing great today. Unfortunately she did have a death in the family her older sister and she tells me that her other sister is on hospice. Electronic Signature(s) Signed: 04/16/2023 11:07:59 AM By: Elizabeth Derry PA-C Entered By: [name] on 04/16/2023 11:07:58 -------------------------------------------------------------------------------- Physical Exam Details Patient Name: Date of Service: [name], [name] 04/16/2023 10:00 A M Medical Record Number: [id number] Patient Account Number: [id number] Date of Birth/Sex: Treating RN: 02-Sep-1955 (67 y.o. [name] Primary Care Provider: [name] [name] Other Clinician: Betha [name] Referring Provider: Treating Provider/Extender: [name] in Treatment: 27 Constitutional Obese and well-hydrated in no acute distress. Respiratory normal breathing without difficulty. Psychiatric this patient is able to make decisions and demonstrates good insight into disease process. Alert and Oriented x 3. pleasant and cooperative. Notes Upon inspection patient's wound bed actually showed signs of being completely closed nothing is open on the legs right now and she is doing quite well. I am actually very pleased with where things stand I do not see any signs of infection and in general I believe that she is in good shape here. She has started [facility] and she is hoping to lose some weight as well. Electronic Signature(s) Signed:  04/16/2023 11:08:23 AM By: Elizabeth Derry PA-C Entered By: [name] on 04/16/2023 11:08:23 [name] ([id number]) [id number] Page 7 of 15 -------------------------------------------------------------------------------- Physician Orders Details Patient Name: Date of Service: [name], [name] 04/16/2023 10:00 A M Medical Record Number: [id number] Patient Account Number: [id number] Date of Birth/Sex: Treating RN: 04-Aug-1955 (67 y.o. 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 Wash wounds with antibacterial soap and water. May shower; gently cleanse wound with antibacterial soap, rinse and pat dry prior to dressing wounds No tub bath. Edema Control - Lymphedema / Segmental Compressive Device / Other Elevate, Exercise Daily and A void Standing for Long Periods of Time. Elevate leg(s) parallel to the floor when sitting. DO YOUR BEST to sleep in the bed at night. DO NOT sleep in your recliner. Long hours of sitting in a recliner leads to swelling of the legs and/or potential wounds on your backside. Other: - use lymphedema pumps at least twice daily wrap RLE from knee down with ace wrap use velcro wrap to upper calf area Non-Wound Condition Bilateral Lower Extremities Cleanse affected area with antibacterial soap and  water, Medications-Please add to medication list. ntibiotic - apply Mupirocin to weepy areas  as needed Topical A Electronic Signature(s) Signed: 04/16/2023 11:40:05 AM By: Elizabeth Derry PA-C Signed: 04/16/2023 1:41:37 PM By: [name] Entered By: [name] on 04/16/2023 10:59:20 Problem List Details -------------------------------------------------------------------------------- [name] ([id number]) [id number] Page 8 of 15 Patient Name: Date of Service: [name], [name] 04/16/2023 10:00 A M Medical Record Number: [id number] Patient Account Number: [id number] Date of Birth/Sex: Treating RN: 15-Dec-1955 (67 y.o. 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 04/16/2023 10:00 A M Medical Record Number: [id number] Patient Account Number: [id number] Date of Birth/Sex: Treating RN: 1956/01/28 (67 y.o. [name] Primary Care Provider: [name] [name] Other Clinician: Betha [name] Referring Provider: Treating Provider/Extender: [name] in Treatment: 61 Subjective Chief Complaint Information obtained from Patient Bilateral  LE lymphedema with Left LE Ulcers History of Present Illness (HPI) The patient is a 67 year old female with history of hypertension and a long-standing history of bilateral lower extremity lymphedema (first presented on 4/2) . She has had open ulcers in the past which have always responded to compression therapy. She had briefly been to a lymphedema clinic in the past which helped her at the time. this time around she stopped treatment of her lymphedema pumps approximately 2 weeks ago because of some pain in the knees and then noticed the right leg getting worse. She was seen by her PCP who put her on clindamycin 4 times a day 2 days ago. The patient has seen [facility] and Dr. Gilda [name] had seen her last year where a vascular study including venous and arterial duplex studies were within normal limits. he had recommended compression stockings and lymphedema pumps and the patient has been using this in about 2 weeks ago. She is known to be diabetic but in the past few time she's gone to her primary care doctor her hemoglobin A1c has been normal. 02/11/2015 - after her last visit she took my advice and went to the ER regarding the progressive cellulitis of her right lower extremity and she was admitted VOLA, BENEKE ([id number]) [id number] Page 9 of 15 between July 17 and 22nd. She received IV antibiotics and then was sent home on a course of steroid-induced and oral antibiotics. She has improved much since then. 02/17/2015 -- she has been doing fine and the weeping of her legs has remarkably gone down. She has no fresh issues. READMISSION 01/15/18 This patient was given this clinic before most recently in 2016 seen by Dr. Meyer [name]. She has massive bilateral lymphedema and over the last 2 months this had weeping edema out of the left leg. She has compression pumps but her compliance with these has been minimal. She has [facility] they've been using TCA/ABDs/kerlix under  an Ace wrap.she has had recent problems with cellulitis. She was apparently seen in the ER and 12/23/17 and given clindamycin. She was then followed by her primary doctor and given doxycycline and  do this on the right leg she has a special type of wrap that she uses from her mid calf up to her knee. She did not have any obvious open wounds but are intake nurse did notice the weeping lower area at on the right just above her ankle 04-16-2023 upon evaluation today patient appears to be doing well currently she does not have any open wounds at this point which is great news and in general I do believe that we are making really good headway towards closure. Fortunately she is doing great today. Unfortunately she did have a death in the family her older sister and she tells me that her other sister is on hospice. Electronic Signature(s) Signed: 04/16/2023 11:07:59 AM By: Elizabeth Derry PA-C Entered By: [name] on 04/16/2023 11:07:58 -------------------------------------------------------------------------------- Physical Exam Details Patient Name: Date of Service: [name], [name] 04/16/2023 10:00 A M Medical Record Number: [id number] Patient Account Number: [id number] Date of Birth/Sex: Treating RN: 02-Sep-1955 (67 y.o. [name] Primary Care Provider: [name] [name] Other Clinician: Betha [name] Referring Provider: Treating Provider/Extender: [name] in Treatment: 27 Constitutional Obese and well-hydrated in no acute distress. Respiratory normal breathing without difficulty. Psychiatric this patient is able to make decisions and demonstrates good insight into disease process. Alert and Oriented x 3. pleasant and cooperative. Notes Upon inspection patient's wound bed actually showed signs of being completely closed nothing is open on the legs right now and she is doing quite well. I am actually very pleased with where things stand I do not see any signs of infection and in general I believe that she is in good shape here. She has started [facility] and she is hoping to lose some weight as well. Electronic Signature(s) Signed:  04/16/2023 11:08:23 AM By: Elizabeth Derry PA-C Entered By: [name] on 04/16/2023 11:08:23 [name] ([id number]) [id number] Page 7 of 15 -------------------------------------------------------------------------------- Physician Orders Details Patient Name: Date of Service: [name], [name] 04/16/2023 10:00 A M Medical Record Number: [id number] Patient Account Number: [id number] Date of Birth/Sex: Treating RN: 04-Aug-1955 (67 y.o. 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 Wash wounds with antibacterial soap and water. May shower; gently cleanse wound with antibacterial soap, rinse and pat dry prior to dressing wounds No tub bath. Edema Control - Lymphedema / Segmental Compressive Device / Other Elevate, Exercise Daily and A void Standing for Long Periods of Time. Elevate leg(s) parallel to the floor when sitting. DO YOUR BEST to sleep in the bed at night. DO NOT sleep in your recliner. Long hours of sitting in a recliner leads to swelling of the legs and/or potential wounds on your backside. Other: - use lymphedema pumps at least twice daily wrap RLE from knee down with ace wrap use velcro wrap to upper calf area Non-Wound Condition Bilateral Lower Extremities Cleanse affected area with antibacterial soap and  water, Medications-Please add to medication list. ntibiotic - apply Mupirocin to weepy areas  as needed Topical A Electronic Signature(s) Signed: 04/16/2023 11:40:05 AM By: Elizabeth Derry PA-C Signed: 04/16/2023 1:41:37 PM By: [name] Entered By: [name] on 04/16/2023 10:59:20 Problem List Details -------------------------------------------------------------------------------- [name] ([id number]) [id number] Page 8 of 15 Patient Name: Date of Service: [name], [name] 04/16/2023 10:00 A M Medical Record Number: [id number] Patient Account Number: [id number] Date of Birth/Sex: Treating RN: 15-Dec-1955 (67 y.o. 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 04/16/2023 10:00 A M Medical Record Number: [id number] Patient Account Number: [id number] Date of Birth/Sex: Treating RN: 1956/01/28 (67 y.o. [name] Primary Care Provider: [name] [name] Other Clinician: Betha [name] Referring Provider: Treating Provider/Extender: [name] in Treatment: 61 Subjective Chief Complaint Information obtained from Patient Bilateral  LE lymphedema with Left LE Ulcers History of Present Illness (HPI) The patient is a 67 year old female with history of hypertension and a long-standing history of bilateral lower extremity lymphedema (first presented on 4/2) . She has had open ulcers in the past which have always responded to compression therapy. She had briefly been to a lymphedema clinic in the past which helped her at the time. this time around she stopped treatment of her lymphedema pumps approximately 2 weeks ago because of some pain in the knees and then noticed the right leg getting worse. She was seen by her PCP who put her on clindamycin 4 times a day 2 days ago. The patient has seen [facility] and Dr. Gilda [name] had seen her last year where a vascular study including venous and arterial duplex studies were within normal limits. he had recommended compression stockings and lymphedema pumps and the patient has been using this in about 2 weeks ago. She is known to be diabetic but in the past few time she's gone to her primary care doctor her hemoglobin A1c has been normal. 02/11/2015 - after her last visit she took my advice and went to the ER regarding the progressive cellulitis of her right lower extremity and she was admitted VOLA, BENEKE ([id number]) [id number] Page 9 of 15 between July 17 and 22nd. She received IV antibiotics and then was sent home on a course of steroid-induced and oral antibiotics. She has improved much since then. 02/17/2015 -- she has been doing fine and the weeping of her legs has remarkably gone down. She has no fresh issues. READMISSION 01/15/18 This patient was given this clinic before most recently in 2016 seen by Dr. Meyer [name]. She has massive bilateral lymphedema and over the last 2 months this had weeping edema out of the left leg. She has compression pumps but her compliance with these has been minimal. She has [facility] they've been using TCA/ABDs/kerlix under  an Ace wrap.she has had recent problems with cellulitis. She was apparently seen in the ER and 12/23/17 and given clindamycin. She was then followed by her primary doctor and given doxycycline and  as needed Topical A Electronic Signature(s) Signed: 04/16/2023 11:40:05 AM By: Elizabeth Derry PA-C Signed: 04/16/2023 1:41:37 PM By: [name] Entered By: [name] on 04/16/2023 10:59:20 Problem List Details -------------------------------------------------------------------------------- [name] ([id number]) [id number] Page 8 of 15 Patient Name: Date of Service: [name], [name] 04/16/2023 10:00 A M Medical Record Number: [id number] Patient Account Number: [id number] Date of Birth/Sex: Treating RN: 15-Dec-1955 (67 y.o. 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 04/16/2023 10:00 A M Medical Record Number: [id number] Patient Account Number: [id number] Date of Birth/Sex: Treating RN: 1956/01/28 (67 y.o. [name] Primary Care Provider: [name] [name] Other Clinician: Betha [name] Referring Provider: Treating Provider/Extender: [name] in Treatment: 61 Subjective Chief Complaint Information obtained from Patient Bilateral  LE lymphedema with Left LE Ulcers History of Present Illness (HPI) The patient is a 67 year old female with history of hypertension and a long-standing history of bilateral lower extremity lymphedema (first presented on 4/2) . She has had open ulcers in the past which have always responded to compression therapy. She had briefly been to a lymphedema clinic in the past which helped her at the time. this time around she stopped treatment of her lymphedema pumps approximately 2 weeks ago because of some pain in the knees and then noticed the right leg getting worse. She was seen by her PCP who put her on clindamycin 4 times a day 2 days ago. The patient has seen [facility] and Dr. Gilda [name] had seen her last year where a vascular study including venous and arterial duplex studies were within normal limits. he had recommended compression stockings and lymphedema pumps and the patient has been using this in about 2 weeks ago. She is known to be diabetic but in the past few time she's gone to her primary care doctor her hemoglobin A1c has been normal. 02/11/2015 - after her last visit she took my advice and went to the ER regarding the progressive cellulitis of her right lower extremity and she was admitted VOLA, BENEKE ([id number]) [id number] Page 9 of 15 between July 17 and 22nd. She received IV antibiotics and then was sent home on a course of steroid-induced and oral antibiotics. She has improved much since then. 02/17/2015 -- she has been doing fine and the weeping of her legs has remarkably gone down. She has no fresh issues. READMISSION 01/15/18 This patient was given this clinic before most recently in 2016 seen by Dr. Meyer [name]. She has massive bilateral lymphedema and over the last 2 months this had weeping edema out of the left leg. She has compression pumps but her compliance with these has been minimal. She has [facility] they've been using TCA/ABDs/kerlix under  an Ace wrap.she has had recent problems with cellulitis. She was apparently seen in the ER and 12/23/17 and given clindamycin. She was then followed by her primary doctor and given doxycycline and  do this on the right leg she has a special type of wrap that she uses from her mid calf up to her knee. She did not have any obvious open wounds but are intake nurse did notice the weeping lower area at on the right just above her ankle 04-16-2023 upon evaluation today patient appears to be doing well currently she does not have any open wounds at this point which is great news and in general I do believe that we are making really good headway towards closure. Fortunately she is doing great today. Unfortunately she did have a death in the family her older sister and she tells me that her other sister is on hospice. Electronic Signature(s) Signed: 04/16/2023 11:07:59 AM By: Elizabeth Derry PA-C Entered By: [name] on 04/16/2023 11:07:58 -------------------------------------------------------------------------------- Physical Exam Details Patient Name: Date of Service: [name], [name] 04/16/2023 10:00 A M Medical Record Number: [id number] Patient Account Number: [id number] Date of Birth/Sex: Treating RN: 02-Sep-1955 (67 y.o. [name] Primary Care Provider: [name] [name] Other Clinician: Betha [name] Referring Provider: Treating Provider/Extender: [name] in Treatment: 27 Constitutional Obese and well-hydrated in no acute distress. Respiratory normal breathing without difficulty. Psychiatric this patient is able to make decisions and demonstrates good insight into disease process. Alert and Oriented x 3. pleasant and cooperative. Notes Upon inspection patient's wound bed actually showed signs of being completely closed nothing is open on the legs right now and she is doing quite well. I am actually very pleased with where things stand I do not see any signs of infection and in general I believe that she is in good shape here. She has started [facility] and she is hoping to lose some weight as well. Electronic Signature(s) Signed:  04/16/2023 11:08:23 AM By: Elizabeth Derry PA-C Entered By: [name] on 04/16/2023 11:08:23 [name] ([id number]) [id number] Page 7 of 15 -------------------------------------------------------------------------------- Physician Orders Details Patient Name: Date of Service: [name], [name] 04/16/2023 10:00 A M Medical Record Number: [id number] Patient Account Number: [id number] Date of Birth/Sex: Treating RN: 04-Aug-1955 (67 y.o. 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 Wash wounds with antibacterial soap and water. May shower; gently cleanse wound with antibacterial soap, rinse and pat dry prior to dressing wounds No tub bath. Edema Control - Lymphedema / Segmental Compressive Device / Other Elevate, Exercise Daily and A void Standing for Long Periods of Time. Elevate leg(s) parallel to the floor when sitting. DO YOUR BEST to sleep in the bed at night. DO NOT sleep in your recliner. Long hours of sitting in a recliner leads to swelling of the legs and/or potential wounds on your backside. Other: - use lymphedema pumps at least twice daily wrap RLE from knee down with ace wrap use velcro wrap to upper calf area Non-Wound Condition Bilateral Lower Extremities Cleanse affected area with antibacterial soap and  water, Medications-Please add to medication list. ntibiotic - apply Mupirocin to weepy areas  evidence of infection or worsening. Based on what I am seeing I do believe that were making good headway towards closure. 2. I am good recommend as well the patient should continue with the mupirocin to any open areas as needed although she is not really needing a lot of that at this point. 3. I am going to recommend that she continue to use her pumps twice a day and elevate her legs is much as possible sleeping in the bed always helps her a lot she seems to be doing well at the moment. We will see patient back for reevaluation in 1 week here in the clinic. If anything worsens or changes patient will contact our office for additional recommendations. Electronic Signature(s) Signed: 04/16/2023 11:08:56 AM By: Elizabeth Derry PA-C Entered By: [name] on 04/16/2023 11:08:56 -------------------------------------------------------------------------------- SuperBill Details Patient Name: Date of Service: [name], [name] 04/16/2023 Medical Record Number: [id number] Patient Account Number: [id number] Date of Birth/Sex: Treating RN: February 15, 1956 (67 y.o. 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 Facility Procedures : CPT4 Code: [id number] Description: 47829 - WOUND CARE VISIT-LEV 2 EST PT Modifier: Quantity: 1 Physician Procedures : CPT4 Code Description Modifier [id number] 99213 - WC PHYS LEVEL 3 - EST PT Elizabeth, [name] ([id number]) [id number] Page ICD-10 Diagnosis Description Q82.0 Hereditary lymphedema L97.811 Non-pressure  chronic ulcer of other part  of right lower leg limited to breakdown of skin L97.821 Non-pressure chronic ulcer of other part of left lower leg limited to breakdown of skin Quantity: 1 15 of 15 Electronic Signature(s) Signed: 04/16/2023 11:09:13 AM By: Elizabeth Derry PA-C Entered By: [name] on 04/16/2023 11:09:13  do this on the right leg she has a special type of wrap that she uses from her mid calf up to her knee. She did not have any obvious open wounds but are intake nurse did notice the weeping lower area at on the right just above her ankle 04-16-2023 upon evaluation today patient appears to be doing well currently she does not have any open wounds at this point which is great news and in general I do believe that we are making really good headway towards closure. Fortunately she is doing great today. Unfortunately she did have a death in the family her older sister and she tells me that her other sister is on hospice. Electronic Signature(s) Signed: 04/16/2023 11:07:59 AM By: Elizabeth Derry PA-C Entered By: [name] on 04/16/2023 11:07:58 -------------------------------------------------------------------------------- Physical Exam Details Patient Name: Date of Service: [name], [name] 04/16/2023 10:00 A M Medical Record Number: [id number] Patient Account Number: [id number] Date of Birth/Sex: Treating RN: 02-Sep-1955 (67 y.o. [name] Primary Care Provider: [name] [name] Other Clinician: Betha [name] Referring Provider: Treating Provider/Extender: [name] in Treatment: 27 Constitutional Obese and well-hydrated in no acute distress. Respiratory normal breathing without difficulty. Psychiatric this patient is able to make decisions and demonstrates good insight into disease process. Alert and Oriented x 3. pleasant and cooperative. Notes Upon inspection patient's wound bed actually showed signs of being completely closed nothing is open on the legs right now and she is doing quite well. I am actually very pleased with where things stand I do not see any signs of infection and in general I believe that she is in good shape here. She has started [facility] and she is hoping to lose some weight as well. Electronic Signature(s) Signed:  04/16/2023 11:08:23 AM By: Elizabeth Derry PA-C Entered By: [name] on 04/16/2023 11:08:23 [name] ([id number]) [id number] Page 7 of 15 -------------------------------------------------------------------------------- Physician Orders Details Patient Name: Date of Service: [name], [name] 04/16/2023 10:00 A M Medical Record Number: [id number] Patient Account Number: [id number] Date of Birth/Sex: Treating RN: 04-Aug-1955 (67 y.o. 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 Wash wounds with antibacterial soap and water. May shower; gently cleanse wound with antibacterial soap, rinse and pat dry prior to dressing wounds No tub bath. Edema Control - Lymphedema / Segmental Compressive Device / Other Elevate, Exercise Daily and A void Standing for Long Periods of Time. Elevate leg(s) parallel to the floor when sitting. DO YOUR BEST to sleep in the bed at night. DO NOT sleep in your recliner. Long hours of sitting in a recliner leads to swelling of the legs and/or potential wounds on your backside. Other: - use lymphedema pumps at least twice daily wrap RLE from knee down with ace wrap use velcro wrap to upper calf area Non-Wound Condition Bilateral Lower Extremities Cleanse affected area with antibacterial soap and  water, Medications-Please add to medication list. ntibiotic - apply Mupirocin to weepy areas  evidence of infection or worsening. Based on what I am seeing I do believe that were making good headway towards closure. 2. I am good recommend as well the patient should continue with the mupirocin to any open areas as needed although she is not really needing a lot of that at this point. 3. I am going to recommend that she continue to use her pumps twice a day and elevate her legs is much as possible sleeping in the bed always helps her a lot she seems to be doing well at the moment. We will see patient back for reevaluation in 1 week here in the clinic. If anything worsens or changes patient will contact our office for additional recommendations. Electronic Signature(s) Signed: 04/16/2023 11:08:56 AM By: Elizabeth Derry PA-C Entered By: [name] on 04/16/2023 11:08:56 -------------------------------------------------------------------------------- SuperBill Details Patient Name: Date of Service: [name], [name] 04/16/2023 Medical Record Number: [id number] Patient Account Number: [id number] Date of Birth/Sex: Treating RN: February 15, 1956 (67 y.o. 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 Facility Procedures : CPT4 Code: 40981191 Description: 47829 - WOUND CARE VISIT-LEV 2 EST PT Modifier: Quantity: 1 Physician Procedures : CPT4 Code Description Modifier [id number] 99213 - WC PHYS LEVEL 3 - EST PT Elizabeth, [name] ([id number]) [id number] Page ICD-10 Diagnosis Description Q82.0 Hereditary lymphedema L97.811 Non-pressure  chronic ulcer of other part  of right lower leg limited to breakdown of skin L97.821 Non-pressure chronic ulcer of other part of left lower leg limited to breakdown of skin Quantity: 1 15 of 15 Electronic Signature(s) Signed: 04/16/2023 11:09:13 AM By: Elizabeth Derry PA-C Entered By: [name] on 04/16/2023 11:09:13  as needed Topical A Electronic Signature(s) Signed: 04/16/2023 11:40:05 AM By: Elizabeth Derry PA-C Signed: 04/16/2023 1:41:37 PM By: [name] Entered By: [name] on 04/16/2023 10:59:20 Problem List Details -------------------------------------------------------------------------------- [name] ([id number]) [id number] Page 8 of 15 Patient Name: Date of Service: [name], [name] 04/16/2023 10:00 A M Medical Record Number: [id number] Patient Account Number: [id number] Date of Birth/Sex: Treating RN: 15-Dec-1955 (67 y.o. 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 04/16/2023 10:00 A M Medical Record Number: [id number] Patient Account Number: [id number] Date of Birth/Sex: Treating RN: 1956/01/28 (67 y.o. [name] Primary Care Provider: [name] [name] Other Clinician: Betha [name] Referring Provider: Treating Provider/Extender: [name] in Treatment: 61 Subjective Chief Complaint Information obtained from Patient Bilateral  LE lymphedema with Left LE Ulcers History of Present Illness (HPI) The patient is a 67 year old female with history of hypertension and a long-standing history of bilateral lower extremity lymphedema (first presented on 4/2) . She has had open ulcers in the past which have always responded to compression therapy. She had briefly been to a lymphedema clinic in the past which helped her at the time. this time around she stopped treatment of her lymphedema pumps approximately 2 weeks ago because of some pain in the knees and then noticed the right leg getting worse. She was seen by her PCP who put her on clindamycin 4 times a day 2 days ago. The patient has seen [facility] and Dr. Gilda [name] had seen her last year where a vascular study including venous and arterial duplex studies were within normal limits. he had recommended compression stockings and lymphedema pumps and the patient has been using this in about 2 weeks ago. She is known to be diabetic but in the past few time she's gone to her primary care doctor her hemoglobin A1c has been normal. 02/11/2015 - after her last visit she took my advice and went to the ER regarding the progressive cellulitis of her right lower extremity and she was admitted VOLA, BENEKE ([id number]) [id number] Page 9 of 15 between July 17 and 22nd. She received IV antibiotics and then was sent home on a course of steroid-induced and oral antibiotics. She has improved much since then. 02/17/2015 -- she has been doing fine and the weeping of her legs has remarkably gone down. She has no fresh issues. READMISSION 01/15/18 This patient was given this clinic before most recently in 2016 seen by Dr. Meyer [name]. She has massive bilateral lymphedema and over the last 2 months this had weeping edema out of the left leg. She has compression pumps but her compliance with these has been minimal. She has [facility] they've been using TCA/ABDs/kerlix under  an Ace wrap.she has had recent problems with cellulitis. She was apparently seen in the ER and 12/23/17 and given clindamycin. She was then followed by her primary doctor and given doxycycline and  evidence of infection or worsening. Based on what I am seeing I do believe that were making good headway towards closure. 2. I am good recommend as well the patient should continue with the mupirocin to any open areas as needed although she is not really needing a lot of that at this point. 3. I am going to recommend that she continue to use her pumps twice a day and elevate her legs is much as possible sleeping in the bed always helps her a lot she seems to be doing well at the moment. We will see patient back for reevaluation in 1 week here in the clinic. If anything worsens or changes patient will contact our office for additional recommendations. Electronic Signature(s) Signed: 04/16/2023 11:08:56 AM By: Elizabeth Derry PA-C Entered By: [name] on 04/16/2023 11:08:56 -------------------------------------------------------------------------------- SuperBill Details Patient Name: Date of Service: [name], [name] 04/16/2023 Medical Record Number: [id number] Patient Account Number: [id number] Date of Birth/Sex: Treating RN: February 15, 1956 (67 y.o. 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 Facility Procedures : CPT4 Code: 40981191 Description: 47829 - WOUND CARE VISIT-LEV 2 EST PT Modifier: Quantity: 1 Physician Procedures : CPT4 Code Description Modifier [id number] 99213 - WC PHYS LEVEL 3 - EST PT Elizabeth, [name] ([id number]) [id number] Page ICD-10 Diagnosis Description Q82.0 Hereditary lymphedema L97.811 Non-pressure  chronic ulcer of other part  of right lower leg limited to breakdown of skin L97.821 Non-pressure chronic ulcer of other part of left lower leg limited to breakdown of skin Quantity: 1 15 of 15 Electronic Signature(s) Signed: 04/16/2023 11:09:13 AM By: Elizabeth Derry PA-C Entered By: [name] on 04/16/2023 11:09:13  [name], [name] ([id number]) [id number] Page 1 of 15 Visit Report for 04/16/2023 Chief Complaint Document Details Patient Name: Date of Service: [name], [name] 04/16/2023 10:00 A M Medical Record Number: [id number] Patient Account Number: [id number] Date of Birth/Sex: Treating RN: Nov 21, 1955 (67 y.o. [name] Primary Care Provider: [name] [name] Other Clinician: Betha [name] Referring Provider: Treating Provider/Extender: [name] in Treatment: 61 Information Obtained from: Patient Chief Complaint Bilateral LE lymphedema with Left LE Ulcers Electronic Signature(s) Signed: 04/16/2023 10:06:36 AM By: Elizabeth Derry PA-C Entered By: [name] on 04/16/2023 10:06:36 -------------------------------------------------------------------------------- HPI Details Patient Name: Date of Service: [name]. 04/16/2023 10:00 A M Medical Record Number: [id number] Patient Account Number: [id number] Date of Birth/Sex: Treating RN: 04/07/1956 (67 y.o. [name] Primary Care Provider: [name] [name] Other Clinician: Betha [name] Referring Provider: Treating Provider/Extender: [name] in Treatment: 60 History of Present Illness HPI Description: The patient is a 67 year old female with history of hypertension and a long-standing history of bilateral lower extremity lymphedema (first presented on 4/2) . She has had open ulcers in the past which have always responded to compression therapy. She had briefly been to a lymphedema clinic in the past which helped her at the time. this time around she stopped treatment of her lymphedema pumps approximately 2 weeks ago because of some pain in the knees and then noticed the right leg getting worse. She was seen by her PCP who put her on clindamycin 4 times a day 2 days ago. The patient has seen [facility] and Dr. Gilda [name] had seen her last year where a vascular study  including venous and arterial duplex studies were within normal limits. he had recommended compression stockings and lymphedema pumps and the patient has been using this in about 2 weeks ago. She is known to be diabetic but in the past few time she's gone to her primary care doctor her hemoglobin A1c has been normal. 02/11/2015 - after her last visit she took my advice and went to the ER regarding the progressive cellulitis of her right lower extremity and she was admitted between July 17 and 22nd. She received IV antibiotics and then was sent home on a course of steroid-induced and oral antibiotics. She has improved much since then. 02/17/2015 -- she has been doing fine and the weeping of her legs has remarkably gone down. She has no fresh issues. Elizabeth, [name] ([id number]) [id number] Page 2 of 15 01/15/18 This patient was given this clinic before most recently in 2016 seen by Dr. Meyer [name]. She has massive bilateral lymphedema and over the last 2 months this had weeping edema out of the left leg. She has compression pumps but her compliance with these has been minimal. She has [facility] they've been using TCA/ABDs/kerlix under an Ace wrap.she has had recent problems with cellulitis. She was apparently seen in the ER and 12/23/17 and given clindamycin. She was then followed by her primary doctor and given doxycycline and Keflex. The pain seems to have settled down. In April 2018 the patient had arterial studies done at [facility]. This showed triphasic waveforms throughout the right leg and mostly triphasic waveforms on the left except for monophasic at the posterior tibial artery distally. She was not felt to have evidence of right lower extremity arterial stenosis or significant problems on the left side. 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 She also had a right lower extremity  do this on the right leg she has a special type of wrap that she uses from her mid calf up to her knee. She did not have any obvious open wounds but are intake nurse did notice the weeping lower area at on the right just above her ankle 04-16-2023 upon evaluation today patient appears to be doing well currently she does not have any open wounds at this point which is great news and in general I do believe that we are making really good headway towards closure. Fortunately she is doing great today. Unfortunately she did have a death in the family her older sister and she tells me that her other sister is on hospice. Electronic Signature(s) Signed: 04/16/2023 11:07:59 AM By: Elizabeth Derry PA-C Entered By: [name] on 04/16/2023 11:07:58 -------------------------------------------------------------------------------- Physical Exam Details Patient Name: Date of Service: [name], [name] 04/16/2023 10:00 A M Medical Record Number: [id number] Patient Account Number: [id number] Date of Birth/Sex: Treating RN: 02-Sep-1955 (67 y.o. [name] Primary Care Provider: [name] [name] Other Clinician: Betha [name] Referring Provider: Treating Provider/Extender: [name] in Treatment: 27 Constitutional Obese and well-hydrated in no acute distress. Respiratory normal breathing without difficulty. Psychiatric this patient is able to make decisions and demonstrates good insight into disease process. Alert and Oriented x 3. pleasant and cooperative. Notes Upon inspection patient's wound bed actually showed signs of being completely closed nothing is open on the legs right now and she is doing quite well. I am actually very pleased with where things stand I do not see any signs of infection and in general I believe that she is in good shape here. She has started [facility] and she is hoping to lose some weight as well. Electronic Signature(s) Signed:  04/16/2023 11:08:23 AM By: Elizabeth Derry PA-C Entered By: [name] on 04/16/2023 11:08:23 [name] ([id number]) [id number] Page 7 of 15 -------------------------------------------------------------------------------- Physician Orders Details Patient Name: Date of Service: [name], [name] 04/16/2023 10:00 A M Medical Record Number: [id number] Patient Account Number: [id number] Date of Birth/Sex: Treating RN: 04-Aug-1955 (67 y.o. 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 Wash wounds with antibacterial soap and water. May shower; gently cleanse wound with antibacterial soap, rinse and pat dry prior to dressing wounds No tub bath. Edema Control - Lymphedema / Segmental Compressive Device / Other Elevate, Exercise Daily and A void Standing for Long Periods of Time. Elevate leg(s) parallel to the floor when sitting. DO YOUR BEST to sleep in the bed at night. DO NOT sleep in your recliner. Long hours of sitting in a recliner leads to swelling of the legs and/or potential wounds on your backside. Other: - use lymphedema pumps at least twice daily wrap RLE from knee down with ace wrap use velcro wrap to upper calf area Non-Wound Condition Bilateral Lower Extremities Cleanse affected area with antibacterial soap and  water, Medications-Please add to medication list. ntibiotic - apply Mupirocin to weepy areas  do this on the right leg she has a special type of wrap that she uses from her mid calf up to her knee. She did not have any obvious open wounds but are intake nurse did notice the weeping lower area at on the right just above her ankle 04-16-2023 upon evaluation today patient appears to be doing well currently she does not have any open wounds at this point which is great news and in general I do believe that we are making really good headway towards closure. Fortunately she is doing great today. Unfortunately she did have a death in the family her older sister and she tells me that her other sister is on hospice. Electronic Signature(s) Signed: 04/16/2023 11:07:59 AM By: Elizabeth Derry PA-C Entered By: [name] on 04/16/2023 11:07:58 -------------------------------------------------------------------------------- Physical Exam Details Patient Name: Date of Service: [name], [name] 04/16/2023 10:00 A M Medical Record Number: [id number] Patient Account Number: [id number] Date of Birth/Sex: Treating RN: 02-Sep-1955 (67 y.o. [name] Primary Care Provider: [name] [name] Other Clinician: Betha [name] Referring Provider: Treating Provider/Extender: [name] in Treatment: 27 Constitutional Obese and well-hydrated in no acute distress. Respiratory normal breathing without difficulty. Psychiatric this patient is able to make decisions and demonstrates good insight into disease process. Alert and Oriented x 3. pleasant and cooperative. Notes Upon inspection patient's wound bed actually showed signs of being completely closed nothing is open on the legs right now and she is doing quite well. I am actually very pleased with where things stand I do not see any signs of infection and in general I believe that she is in good shape here. She has started [facility] and she is hoping to lose some weight as well. Electronic Signature(s) Signed:  04/16/2023 11:08:23 AM By: Elizabeth Derry PA-C Entered By: [name] on 04/16/2023 11:08:23 [name] ([id number]) [id number] Page 7 of 15 -------------------------------------------------------------------------------- Physician Orders Details Patient Name: Date of Service: [name], [name] 04/16/2023 10:00 A M Medical Record Number: [id number] Patient Account Number: [id number] Date of Birth/Sex: Treating RN: 04-Aug-1955 (67 y.o. 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 Wash wounds with antibacterial soap and water. May shower; gently cleanse wound with antibacterial soap, rinse and pat dry prior to dressing wounds No tub bath. Edema Control - Lymphedema / Segmental Compressive Device / Other Elevate, Exercise Daily and A void Standing for Long Periods of Time. Elevate leg(s) parallel to the floor when sitting. DO YOUR BEST to sleep in the bed at night. DO NOT sleep in your recliner. Long hours of sitting in a recliner leads to swelling of the legs and/or potential wounds on your backside. Other: - use lymphedema pumps at least twice daily wrap RLE from knee down with ace wrap use velcro wrap to upper calf area Non-Wound Condition Bilateral Lower Extremities Cleanse affected area with antibacterial soap and  water, Medications-Please add to medication list. ntibiotic - apply Mupirocin to weepy areas  do this on the right leg she has a special type of wrap that she uses from her mid calf up to her knee. She did not have any obvious open wounds but are intake nurse did notice the weeping lower area at on the right just above her ankle 04-16-2023 upon evaluation today patient appears to be doing well currently she does not have any open wounds at this point which is great news and in general I do believe that we are making really good headway towards closure. Fortunately she is doing great today. Unfortunately she did have a death in the family her older sister and she tells me that her other sister is on hospice. Electronic Signature(s) Signed: 04/16/2023 11:07:59 AM By: Elizabeth Derry PA-C Entered By: [name] on 04/16/2023 11:07:58 -------------------------------------------------------------------------------- Physical Exam Details Patient Name: Date of Service: [name], [name] 04/16/2023 10:00 A M Medical Record Number: [id number] Patient Account Number: [id number] Date of Birth/Sex: Treating RN: 02-Sep-1955 (67 y.o. [name] Primary Care Provider: [name] [name] Other Clinician: Betha [name] Referring Provider: Treating Provider/Extender: [name] in Treatment: 27 Constitutional Obese and well-hydrated in no acute distress. Respiratory normal breathing without difficulty. Psychiatric this patient is able to make decisions and demonstrates good insight into disease process. Alert and Oriented x 3. pleasant and cooperative. Notes Upon inspection patient's wound bed actually showed signs of being completely closed nothing is open on the legs right now and she is doing quite well. I am actually very pleased with where things stand I do not see any signs of infection and in general I believe that she is in good shape here. She has started [facility] and she is hoping to lose some weight as well. Electronic Signature(s) Signed:  04/16/2023 11:08:23 AM By: Elizabeth Derry PA-C Entered By: [name] on 04/16/2023 11:08:23 [name] ([id number]) [id number] Page 7 of 15 -------------------------------------------------------------------------------- Physician Orders Details Patient Name: Date of Service: [name], [name] 04/16/2023 10:00 A M Medical Record Number: [id number] Patient Account Number: [id number] Date of Birth/Sex: Treating RN: 04-Aug-1955 (67 y.o. 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 Wash wounds with antibacterial soap and water. May shower; gently cleanse wound with antibacterial soap, rinse and pat dry prior to dressing wounds No tub bath. Edema Control - Lymphedema / Segmental Compressive Device / Other Elevate, Exercise Daily and A void Standing for Long Periods of Time. Elevate leg(s) parallel to the floor when sitting. DO YOUR BEST to sleep in the bed at night. DO NOT sleep in your recliner. Long hours of sitting in a recliner leads to swelling of the legs and/or potential wounds on your backside. Other: - use lymphedema pumps at least twice daily wrap RLE from knee down with ace wrap use velcro wrap to upper calf area Non-Wound Condition Bilateral Lower Extremities Cleanse affected area with antibacterial soap and  water, Medications-Please add to medication list. ntibiotic - apply Mupirocin to weepy areas  evidence of infection or worsening. Based on what I am seeing I do believe that were making good headway towards closure. 2. I am good recommend as well the patient should continue with the mupirocin to any open areas as needed although she is not really needing a lot of that at this point. 3. I am going to recommend that she continue to use her pumps twice a day and elevate her legs is much as possible sleeping in the bed always helps her a lot she seems to be doing well at the moment. We will see patient back for reevaluation in 1 week here in the clinic. If anything worsens or changes patient will contact our office for additional recommendations. Electronic Signature(s) Signed: 04/16/2023 11:08:56 AM By: Elizabeth Derry PA-C Entered By: [name] on 04/16/2023 11:08:56 -------------------------------------------------------------------------------- SuperBill Details Patient Name: Date of Service: [name], [name] 04/16/2023 Medical Record Number: [id number] Patient Account Number: [id number] Date of Birth/Sex: Treating RN: February 15, 1956 (67 y.o. 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 Facility Procedures : CPT4 Code: 40981191 Description: 47829 - WOUND CARE VISIT-LEV 2 EST PT Modifier: Quantity: 1 Physician Procedures : CPT4 Code Description Modifier [id number] 99213 - WC PHYS LEVEL 3 - EST PT Elizabeth, [name] ([id number]) [id number] ICD-10 Diagnosis Description Q82.0 Hereditary lymphedema L97.811 Non-pressure  chronic ulcer of other part  of right lower leg limited to breakdown of skin L97.821 Non-pressure chronic ulcer of other part of left lower leg limited to breakdown of skin Quantity: 1 15 of 15 Electronic Signature(s) Signed: 04/16/2023 11:09:13 AM By: Elizabeth Derry PA-C Entered By: [name] on 04/16/2023 11:09:13

## 2023-04-16 NOTE — Progress Notes (Addendum)
Notes] Electronic Signature(s) [name], [name] ([id number]) [id number] Page 5 of 8 Signed: 04/16/2023 1:41:37 PM By: Betha Loa Entered By: Betha Loa on 04/16/2023 10:39:32 -------------------------------------------------------------------------------- Multi-Disciplinary Care Plan Details Patient Name: Date of Service: [name], [name] 04/16/2023 10:00 A M Medical Record Number: [id number] Patient Account Number: [id number] Date of Birth/Sex: Treating RN: [birth year] (67 y.o. [name] Primary Care [name] Other Clinician: Betha Loa Referring [name]: Treating [name]/Extender: Wynona Dove in Treatment: 89 Active Inactive Wound/Skin Impairment Nursing Diagnoses: Impaired tissue integrity Knowledge deficit related to ulceration/compromised skin integrity Goals: Ulcer/skin breakdown will have a volume reduction of 30% by week 4 Date Initiated: 08/01/2021 Target Resolution Date: 08/29/2021 Goal Status: Active Ulcer/skin breakdown will have a volume reduction of 50% by week 8 Date Initiated: 08/01/2021 Target Resolution Date: 09/26/2021 Goal Status: Active Ulcer/skin breakdown will have a volume  reduction of 80% by week 12 Date Initiated: 08/01/2021 Target Resolution Date: 10/24/2021 Goal Status: Active Ulcer/skin breakdown will heal within 14 weeks Date Initiated: 08/01/2021 Target Resolution Date: 11/07/2021 Goal Status: Active Interventions: Assess patient/caregiver ability to obtain necessary supplies Assess patient/caregiver ability to perform ulcer/skin care regimen upon admission and as needed Assess ulceration(s) every visit Provide education on ulcer and skin care Notes: Electronic Signature(s) Signed: 04/16/2023 1:41:37 PM By: Betha Loa Signed: 04/16/2023 5:02:18 PM By: Midge Aver MSN RN CNS WTA Entered By: Betha Loa on 04/16/2023 11:00:46 [name] ([id number]) [id number] Page 6 of 8 -------------------------------------------------------------------------------- Non-Wound Condition Assessment Details Patient Name: Date of Service: [name], [name] 04/16/2023 10:00 A M Medical Record Number: [id number] Patient Account Number: [id number] Date of Birth/Sex: Treating RN: [birth year] (67 y.o. [name] Primary Care [name]: [name] [name] Other Clinician: Betha Loa Referring [name]: Treating [name]/Extender: Wynona Dove in Treatment: 31 Non-Wound Condition: Condition: Lymphedema Location: Leg Side: Right Notes: Right leg with decreased drainage this visit. Notes Right lower leg no redness, drainage, odor noted Electronic Signature(s) Signed: 04/16/2023 1:41:37 PM By: Betha Loa Signed: 04/16/2023 5:02:18 PM By: Midge Aver MSN RN CNS WTA Entered By: Betha Loa on 04/16/2023 10:36:42 -------------------------------------------------------------------------------- Non-Wound Condition Assessment Details Patient Name: Date of Service: Elizabeth Pomfret. 04/16/2023 10:00 A M Medical Record Number: [id number] Patient Account Number: [id number] Date of Birth/Sex: Treating  RN: [birth year] (67 y.o. [name] Primary Care Karry Barrilleaux: [name] [name] Other Clinician: Betha Loa Referring Melisia [name]: Treating [name]/Extender: Wynona Dove in Treatment: 16 Non-Wound Condition: Condition: Lymphedema Location: Leg Side: Left Notes: decreased drainage this visit. Notes Left lower leg, no redness, drainage or odor noted Electronic Signature(s) Signed: 04/16/2023 1:41:37 PM By: Betha Loa Signed: 04/16/2023 5:02:18 PM By: Midge Aver MSN RN CNS WTA Entered By: Betha Loa on 04/16/2023 10:36:47 [name] ([id number]) [id number] Page 7 of 8 -------------------------------------------------------------------------------- Pain Assessment Details Patient Name: Date of Service: [name], [name] 04/16/2023 10:00 A M Medical Record Number: [id number] Patient Account Number: [id number] Date of Birth/Sex: Treating RN: 1956-05-10 (67 y.o. [name] Primary Care Naeem Quillin: [name] [name] Other Clinician: Betha Loa Referring [name]: Treating [name]/Extender: Wynona Dove in Treatment: 89 Active Problems Location of Pain Severity and Description of Pain Patient Has Paino No Site Locations Pain Management and Medication Current Pain Management: Electronic Signature(s) Signed: 04/16/2023 1:41:37 PM By: Betha Loa Signed: 04/16/2023 5:02:18 PM By: Midge Aver MSN RN CNS WTA Entered By: Betha Loa on 04/16/2023 10:33:28 -------------------------------------------------------------------------------- Patient/Caregiver Education Details Patient Name: Date of Service: [name]  0 Simple Wound Cleansing - one wound []  - 0 Complex Wound Cleansing - multiple wounds []  - 0 Wound Imaging (photographs - any number of wounds) []  - 0 Wound Tracing (instead of photographs) []  - 0 Simple Wound Measurement - one wound []  - 0 Complex Wound Measurement - multiple wounds INTERVENTIONS - Wound Dressings []  - 0 Small Wound Dressing one or multiple wounds []  - 0 Medium Wound Dressing one or multiple wounds []  - 0 Large Wound Dressing one or multiple wounds []  - 0 Application of Medications - topical []  - 0 Application of Medications - injection INTERVENTIONS - Miscellaneous []  - 0 External ear exam [name] ([id number]) [id number] Page 3 of 8 []  - 0 Specimen Collection (cultures, biopsies, blood, body fluids, etc.) []  - 0 Specimen(s) / Culture(s) sent or taken to Lab for analysis []  - 0 Patient Transfer (multiple staff / Michiel Sites Lift / Similar devices) []  - 0 Simple Staple / Suture removal (25 or less) []  - 0 Complex Staple / Suture removal (26 or more) []  - 0 Hypo / Hyperglycemic Management (close monitor of Blood Glucose) []  - 0 Ankle / Brachial Index (ABI) - do not check if billed separately X- 1 5 Vital Signs Has the patient been seen at the hospital within the last three years: Yes Total Score: 60 Level Of Care: New/Established - Level 2 Electronic Signature(s) Signed: 04/16/2023 1:41:37 PM By: Betha Loa Entered By: Betha Loa on 04/16/2023 11:00:33 -------------------------------------------------------------------------------- Encounter Discharge Information Details Patient Name: Date of Service: Elizabeth Pomfret. 04/16/2023 10:00 A M Medical Record  Number: [id number] Patient Account Number: [id number] Date of Birth/Sex: Treating RN: 1956-01-06 (67 y.o. [name] Primary Care Pruitt Taboada: [name] [name] Other Clinician: Betha Loa Referring Aine Strycharz: Treating Dinesh Ulysse/Extender: Wynona Dove in Treatment: 35 Encounter Discharge Information Items Discharge Condition: Stable Ambulatory Status: Walker Discharge Destination: Home Transportation: Other Accompanied By: self Schedule Follow-up Appointment: Yes Clinical Summary of Care: Electronic Signature(s) Signed: 04/16/2023 1:41:37 PM By: Betha Loa Entered By: Betha Loa on 04/16/2023 11:05:08 -------------------------------------------------------------------------------- Lower Extremity Assessment Details Patient Name: Date of Service: [name], [name] 04/16/2023 10:00 A SIYAH, MAULT ([id number]) [id number] Page 4 of 8 Medical Record Number: [id number] Patient Account Number: [id number] Date of Birth/Sex: Treating RN: 1955/11/25 (67 y.o. [name] Primary Care Araseli Sherry: [name] [name] Other Clinician: Betha Loa Referring Jabar Krysiak: Treating Lenda Baratta/Extender: Wynona Dove in Treatment: 89 Edema Assessment Assessed: [Left: Yes] Franne Forts: Yes] Edema: [Left: Yes] [Right: Yes] Calf Left: Right: Point of Measurement: 34 cm From Medial Instep 75 cm 76.5 cm Ankle Left: Right: Point of Measurement: 10 cm From Medial Instep 58.5 cm 56 cm Vascular Assessment Pulses: Dorsalis Pedis Palpable: [Left:Yes] [Right:Yes] Toe Nail Assessment Left: Right: Thick: No No Discolored: No No Deformed: No No Improper Length and Hygiene: No No Electronic Signature(s) Signed: 04/16/2023 1:41:37 PM By: Betha Loa Signed: 04/16/2023 5:02:18 PM By: Midge Aver MSN RN CNS WTA Entered By: Betha Loa on 04/16/2023  10:39:19 -------------------------------------------------------------------------------- Multi Wound Chart Details Patient Name: Date of Service: Elizabeth Pomfret 04/16/2023 10:00 A M Medical Record Number: [id number] Patient Account Number: [id number] Date of Birth/Sex: Treating RN: Apr 20, 1956 (67 y.o. [name] Primary Care Jamyla Ard: [name] [name] Other Clinician: Betha Loa Referring Sharisse Rantz: Treating Rockey Guarino/Extender: Wynona Dove in Treatment: 30 Vital Signs Height(in): 63 Pulse(bpm): 97 Weight(lbs): 372 Blood Pressure(mmHg): 131/85 Body Mass Index(BMI): 65.9 Temperature(F): 98.1 Respiratory Rate(breaths/min): 18 [Treatment Notes:Wound Assessments Treatment  Notes] Electronic Signature(s) [name], [name] ([id number]) [id number] Page 5 of 8 Signed: 04/16/2023 1:41:37 PM By: Betha Loa Entered By: Betha Loa on 04/16/2023 10:39:32 -------------------------------------------------------------------------------- Multi-Disciplinary Care Plan Details Patient Name: Date of Service: [name], [name] 04/16/2023 10:00 A M Medical Record Number: [id number] Patient Account Number: [id number] Date of Birth/Sex: Treating RN: [birth year] (67 y.o. [name] Primary Care [name] Other Clinician: Betha Loa Referring [name]: Treating [name]/Extender: Wynona Dove in Treatment: 89 Active Inactive Wound/Skin Impairment Nursing Diagnoses: Impaired tissue integrity Knowledge deficit related to ulceration/compromised skin integrity Goals: Ulcer/skin breakdown will have a volume reduction of 30% by week 4 Date Initiated: 08/01/2021 Target Resolution Date: 08/29/2021 Goal Status: Active Ulcer/skin breakdown will have a volume reduction of 50% by week 8 Date Initiated: 08/01/2021 Target Resolution Date: 09/26/2021 Goal Status: Active Ulcer/skin breakdown will have a volume  reduction of 80% by week 12 Date Initiated: 08/01/2021 Target Resolution Date: 10/24/2021 Goal Status: Active Ulcer/skin breakdown will heal within 14 weeks Date Initiated: 08/01/2021 Target Resolution Date: 11/07/2021 Goal Status: Active Interventions: Assess patient/caregiver ability to obtain necessary supplies Assess patient/caregiver ability to perform ulcer/skin care regimen upon admission and as needed Assess ulceration(s) every visit Provide education on ulcer and skin care Notes: Electronic Signature(s) Signed: 04/16/2023 1:41:37 PM By: Betha Loa Signed: 04/16/2023 5:02:18 PM By: Midge Aver MSN RN CNS WTA Entered By: Betha Loa on 04/16/2023 11:00:46 [name] ([id number]) [id number] Page 6 of 8 -------------------------------------------------------------------------------- Non-Wound Condition Assessment Details Patient Name: Date of Service: [name], [name] 04/16/2023 10:00 A M Medical Record Number: [id number] Patient Account Number: [id number] Date of Birth/Sex: Treating RN: [birth year] (67 y.o. [name] Primary Care [name]: [name] [name] Other Clinician: Betha Loa Referring [name]: Treating [name]/Extender: Wynona Dove in Treatment: 31 Non-Wound Condition: Condition: Lymphedema Location: Leg Side: Right Notes: Right leg with decreased drainage this visit. Notes Right lower leg no redness, drainage, odor noted Electronic Signature(s) Signed: 04/16/2023 1:41:37 PM By: Betha Loa Signed: 04/16/2023 5:02:18 PM By: Midge Aver MSN RN CNS WTA Entered By: Betha Loa on 04/16/2023 10:36:42 -------------------------------------------------------------------------------- Non-Wound Condition Assessment Details Patient Name: Date of Service: Elizabeth Pomfret. 04/16/2023 10:00 A M Medical Record Number: [id number] Patient Account Number: [id number] Date of Birth/Sex: Treating  RN: [birth year] (67 y.o. [name] Primary Care Karry Barrilleaux: [name] [name] Other Clinician: Betha Loa Referring Melisia [name]: Treating [name]/Extender: Wynona Dove in Treatment: 16 Non-Wound Condition: Condition: Lymphedema Location: Leg Side: Left Notes: decreased drainage this visit. Notes Left lower leg, no redness, drainage or odor noted Electronic Signature(s) Signed: 04/16/2023 1:41:37 PM By: Betha Loa Signed: 04/16/2023 5:02:18 PM By: Midge Aver MSN RN CNS WTA Entered By: Betha Loa on 04/16/2023 10:36:47 [name] ([id number]) [id number] Page 7 of 8 -------------------------------------------------------------------------------- Pain Assessment Details Patient Name: Date of Service: [name], [name] 04/16/2023 10:00 A M Medical Record Number: [id number] Patient Account Number: [id number] Date of Birth/Sex: Treating RN: 1956-05-10 (67 y.o. [name] Primary Care Naeem Quillin: [name] [name] Other Clinician: Betha Loa Referring [name]: Treating [name]/Extender: Wynona Dove in Treatment: 89 Active Problems Location of Pain Severity and Description of Pain Patient Has Paino No Site Locations Pain Management and Medication Current Pain Management: Electronic Signature(s) Signed: 04/16/2023 1:41:37 PM By: Betha Loa Signed: 04/16/2023 5:02:18 PM By: Midge Aver MSN RN CNS WTA Entered By: Betha Loa on 04/16/2023 10:33:28 -------------------------------------------------------------------------------- Patient/Caregiver Education Details Patient Name: Date of Service: [name]  Billey Gosling 10/8/2024andnbsp10:00 A M Medical Record Number: [id number] Patient Account Number: [id number] Date of Birth/Gender: Treating RN: 07/09/1956 (67 y.o. [name] Primary Care Physician: [name] [name] Other Clinician: Betha Loa Referring Physician: Treating  Physician/Extender: Wynona Dove in Treatment: [address], [place] ([id number]) [id number] Page 8 of 8 Education Assessment Education Provided To: Patient Education Topics Provided Wound/Skin Impairment: Handouts: Other: continue monitoring for any s/s of infection Methods: Explain/Verbal Responses: State content correctly Electronic Signature(s) Signed: 04/16/2023 1:41:37 PM By: Betha Loa Entered By: Betha Loa on 04/16/2023 11:01:52 -------------------------------------------------------------------------------- Vitals Details Patient Name: Date of Service: Elizabeth Pomfret. 04/16/2023 10:00 A M Medical Record Number: [id number] Patient Account Number: [id number] Date of Birth/Sex: Treating RN: 11/18/1955 (67 y.o. [name] Primary Care Keerthi [name]: [name] [name] Other Clinician: Betha Loa Referring Phat Dalton: Treating Carolan Avedisian/Extender: Wynona Dove in Treatment: 89 Vital Signs Time Taken: 10:31 Temperature (F): 98.1 Height (in): 63 Pulse (bpm): 97 Weight (lbs): 372 Respiratory Rate (breaths/min): 18 Body Mass Index (BMI): 65.9 Blood Pressure (mmHg): 131/85 Reference Range: 80 - 120 mg / dl Electronic Signature(s) Signed: 04/16/2023 1:41:37 PM By: Betha Loa Entered By: Betha Loa on 04/16/2023 10:33:13

## 2023-05-17 ENCOUNTER — Encounter: Payer: Medicare HMO | Admitting: Family

## 2023-05-20 NOTE — Progress Notes (Unsigned)
PCP: [facility] (last seen 08/24) Primary Cardiologist: Marcina Millard, MD/ Leanora Ivanoff, [place] (last seen 10/24; returns 04/25)  HPI:  Ms Tragesser is a 67 y/o female with a history of asthma, HTN, arthritis, lymphedema, CKD, secondary hyperparathyroidism, sleep apnea, anemia, spinal stenosis, obesity, previous tobacco use and chronic heart failure.   Admitted 05/10/22 due to worsening edema and drainage. Patient evaluated and found to have cellulitis of the right lower extremity. Had AKI. Vascular and wound consults obtained. Antibiotics started. Became symptomatic with elevated lactic acid and WBC. IVF given and antibiotic changed. CT scan 11/7 did not show any drainable fluid collection. Discharged after 6 days.   Echo 04/17/18: EF 65% with PA pressure of 43 mmHg Echo 04/13/19: EF 55-60% with mild LAE and trace MR Echo 11/01/21: EF of 60-65%.   She presents today for a HF follow-up visit with a chief complaint of moderate fatigue with minimal exertion. This is chronic in nature. Has associated minimal shortness of breath with exertion, occasional constipation, dizziness, rare palpitations and chronic lymphedema along with this. Has been sleeping more during the day but it's because her sleeping habits have gotten turned around. Denies chest pain, cough or abdominal distention.   She started 0.25mg  wegovy on 04/01/23 and subsequently had bloating/ nausea off and on. She says that this has improved unless she overeats or eats too much carbs. Did eat some pizza over the weekend and has had some other saltier foods recently.   ROS: All systems negative except as listed in HPI, PMH and Problem List.  SH:  Social History   Socioeconomic History   Marital status: Single    Spouse name: Not on file   Number of children: Not on file   Years of education: Not on file   Highest education level: Not on file  Occupational History   Not on file  Tobacco Use   Smoking status: Former    Current  packs/day: 0.00    Average packs/day: 0.3 packs/day for 14.0 years (3.5 ttl pk-yrs)    Types: Cigarettes    Start date: 07/09/1976    Quit date: 07/09/1990    Years since quitting: 32.8   Smokeless tobacco: Current    Types: Snuff  Vaping Use   Vaping status: Never Used  Substance and Sexual Activity   Alcohol use: No   Drug use: No   Sexual activity: Not Currently  Other Topics Concern   Not on file  Social History Narrative   Not on file   Social Determinants of Health   Financial Resource Strain: Not on file  Food Insecurity: No Food Insecurity (09/06/2022)   [facility]   Hunger Vital Sign    Worried About Running Out of Food in the Last Year: Never true    Ran Out of Food in the Last Year: Never true  Transportation Needs: No Transportation Needs (09/06/2022)   [facility]   [facility] - Transportation    Lack of Transportation (Medical): No    Lack of Transportation (Non-Medical): No  Physical Activity: Not on file  Stress: Not on file  Social Connections: Not on file  Intimate Partner Violence: Not At Risk (05/10/2022)   Humiliation, Afraid, Rape, and Kick questionnaire    Fear of Current or Ex-Partner: No    Emotionally Abused: No    Physically Abused: No    Sexually Abused: No    FH:  Family History  Problem Relation Age of  Onset   Breast cancer Maternal Aunt    Thyroid disease Other     Past Medical History:  Diagnosis Date   Arthritis    Asthma    CHF (congestive heart failure) (HCC)    Enlarged heart    Hypertension    Lymphedema    Sleep apnea    Spinal stenosis     Current Outpatient Medications  Medication Sig Dispense Refill   acetaminophen (TYLENOL) 500 MG tablet Take 500-1,000 mg by mouth every 6 (six) hours as needed for mild pain or fever.     albuterol (VENTOLIN HFA) 108 (90 Base) MCG/ACT inhaler Inhale 1-2 puffs into the lungs every 6 (six) hours as needed for  wheezing or shortness of breath.     bumetanide (BUMEX) 2 MG tablet Take 1 tablet (2 mg total) by mouth daily. Stop Furosemide 30 tablet 4   cetirizine (ZYRTEC) 10 MG tablet Take 1 tablet (10 mg total) by mouth daily. (Patient taking differently: Take 10 mg by mouth daily as needed for allergies.) 60 tablet 0   FARXIGA 10 MG TABS tablet Take 1 tablet (10 mg total) by mouth daily before breakfast. 30 tablet 4   KLOR-CON M20 20 MEQ tablet Take 2 tablets (40 mEq total) by mouth daily. 180 tablet 2   metolazone (ZAROXOLYN) 5 MG tablet Take 0.5 tablets (2.5 mg total) by mouth 2 (two) times a week. 12 tablet 2   mupirocin ointment (BACTROBAN) 2 % Apply 1 Application topically daily as needed (wound care).     NYSTATIN powder Apply 1 Application topically 2 (two) times daily as needed (Yeast Skin infection).     Semaglutide-Weight Management (WEGOVY) 0.25 MG/0.5ML SOAJ Inject 0.25 mg into the skin once a week. 2 mL 0   senna-docusate (SENOKOT-S) 8.6-50 MG tablet Take 1 tablet by mouth 2 (two) times daily as needed for mild constipation or moderate constipation.     spironolactone (ALDACTONE) 25 MG tablet TAKE 0.5 TABLET BY MOUTH ONCE DAILY. 15 tablet 6   No current facility-administered medications for this visit.   Vitals:   05/21/23 0939  BP: 122/85  Pulse: 88  SpO2: 98%  Weight: (!) 333 lb (151 kg)   Wt Readings from Last 3 Encounters:  05/21/23 (!) 333 lb (151 kg)  04/16/23 (!) 332 lb (150.6 kg)  03/05/23 (!) 355 lb (161 kg)   Lab Results  Component Value Date   CREATININE 1.81 (H) 08/31/2022   CREATININE 1.28 (H) 08/03/2022   CREATININE 1.32 (H) 07/23/2022   PHYSICAL EXAM:  General:  Well appearing. No resp difficulty HEENT: normal Neck: supple. JVP flat. No lymphadenopathy or thryomegaly appreciated. Cor: PMI normal. Regular rate & rhythm. No rubs, gallops or murmurs. Lungs: clear Abdomen: soft, nontender, nondistended. No hepatosplenomegaly. No bruits or masses.  Extremities:  no cyanosis, clubbing, rash. + bilateral lymphedema Neuro: alert & oriented x3, cranial nerves grossly intact. Moves all 4 extremities w/o difficulty. Affect pleasant.   ECG: not done   ASSESSMENT & PLAN:  1: Chronic heart failure with preserved ejection fraction- - likely HTN/ sleep apnea - NYHA class III - euvolemic today - has attempted to weigh daily with standing on 2 scales at home but it fluctuates too much - weight stable from last visit here 1 month ago - Echo 04/17/18: EF 65% with PA pressure of 43 mmHg - Echo 04/13/19: EF 55-60% with mild LAE and trace MR - Echo 11/01/21: EF of 60-65%. - reviewed the importance of keeping her  sodium 2000mg  as well as keeping daily fluid intake to 60-64 ounces/ day - facial/mouth swelling with ACEi - continue farxiga 10mg   - continue spironolactone 12.5mg  daily; consider titrating up if BP maintains - continue bumex 2mg  QOD - continue metolazone 2.5mg  twice weekly - continue potassium daily - saw cardiology Mellissa Kohut) 10/24 - BNP 10/31/21 was 123.9  2: HTN with stage 3 CKD- - BP 122/85 - sees PCP Jesse Sans) @ [facility]  - saw nephrology Cherylann Ratel) 11/24  - BMP 05/16/23 reviewed and showed sodium 137, potassium 3.9, creatinine 1.6 and GFR 35  3: Lymphedema- - saw wound center last month  4: Sleep apnea- - not wearing CPAP nightly due to having water in the mask; insurance will not allow another CPAP machine until 2025 - saw pulmonology Welton Flakes) 04/24  5: Morbid obesity- - BMI 04/16/23 was 58.06 which is down from 61.90  - wegovy 0.25mg  started 04/01/23; will increase this to 0.5mg  weekly - discussed health coach to assist with healthy eating habits and she is agreeable so referral placed - can take docusate sodium 1-2 times daily to aid with constipation - unable to attend [place] [facility] clinic due to transportation issues  Return in 1 month, sooner if needed.

## 2023-05-21 ENCOUNTER — Ambulatory Visit: Payer: Medicare Other | Attending: Family | Admitting: Family

## 2023-05-21 ENCOUNTER — Encounter: Payer: Medicare Other | Attending: Physician Assistant | Admitting: Physician Assistant

## 2023-05-21 ENCOUNTER — Encounter: Payer: Self-pay | Admitting: Family

## 2023-05-21 VITALS — BP 122/85 | HR 88 | Wt 333.0 lb

## 2023-05-21 DIAGNOSIS — J45909 Unspecified asthma, uncomplicated: Secondary | ICD-10-CM | POA: Insufficient documentation

## 2023-05-21 DIAGNOSIS — M1990 Unspecified osteoarthritis, unspecified site: Secondary | ICD-10-CM | POA: Diagnosis not present

## 2023-05-21 DIAGNOSIS — G4730 Sleep apnea, unspecified: Secondary | ICD-10-CM | POA: Diagnosis not present

## 2023-05-21 DIAGNOSIS — Z87891 Personal history of nicotine dependence: Secondary | ICD-10-CM | POA: Insufficient documentation

## 2023-05-21 DIAGNOSIS — I10 Essential (primary) hypertension: Secondary | ICD-10-CM

## 2023-05-21 DIAGNOSIS — I890 Lymphedema, not elsewhere classified: Secondary | ICD-10-CM

## 2023-05-21 DIAGNOSIS — N183 Chronic kidney disease, stage 3 unspecified: Secondary | ICD-10-CM | POA: Diagnosis not present

## 2023-05-21 DIAGNOSIS — L97821 Non-pressure chronic ulcer of other part of left lower leg limited to breakdown of skin: Secondary | ICD-10-CM | POA: Diagnosis not present

## 2023-05-21 DIAGNOSIS — Z79899 Other long term (current) drug therapy: Secondary | ICD-10-CM | POA: Insufficient documentation

## 2023-05-21 DIAGNOSIS — R220 Localized swelling, mass and lump, head: Secondary | ICD-10-CM | POA: Diagnosis not present

## 2023-05-21 DIAGNOSIS — I130 Hypertensive heart and chronic kidney disease with heart failure and stage 1 through stage 4 chronic kidney disease, or unspecified chronic kidney disease: Secondary | ICD-10-CM | POA: Diagnosis present

## 2023-05-21 DIAGNOSIS — I5032 Chronic diastolic (congestive) heart failure: Secondary | ICD-10-CM

## 2023-05-21 DIAGNOSIS — N2581 Secondary hyperparathyroidism of renal origin: Secondary | ICD-10-CM | POA: Diagnosis not present

## 2023-05-21 DIAGNOSIS — I110 Hypertensive heart disease with heart failure: Secondary | ICD-10-CM | POA: Diagnosis not present

## 2023-05-21 DIAGNOSIS — L97811 Non-pressure chronic ulcer of other part of right lower leg limited to breakdown of skin: Secondary | ICD-10-CM | POA: Insufficient documentation

## 2023-05-21 DIAGNOSIS — G4733 Obstructive sleep apnea (adult) (pediatric): Secondary | ICD-10-CM | POA: Diagnosis not present

## 2023-05-21 DIAGNOSIS — K5900 Constipation, unspecified: Secondary | ICD-10-CM | POA: Insufficient documentation

## 2023-05-21 DIAGNOSIS — Q820 Hereditary lymphedema: Secondary | ICD-10-CM | POA: Insufficient documentation

## 2023-05-21 DIAGNOSIS — I509 Heart failure, unspecified: Secondary | ICD-10-CM | POA: Diagnosis not present

## 2023-05-21 MED ORDER — SEMAGLUTIDE(0.25 OR 0.5MG/DOS) 2 MG/3ML ~~LOC~~ SOPN: 0.5000 mg | PEN_INJECTOR | SUBCUTANEOUS | 3 refills | Status: DC

## 2023-05-21 NOTE — Progress Notes (Signed)
[name], [name] ([id number]) [id number] Page 1 of 2 Visit Report for 05/21/2023 Chief Complaint Document Details Patient Name: Date of Service: [name], [name] 05/21/2023 10:45 A M Medical Record Number: [id number] Patient Account Number: [id number] Date of Birth/Sex: Treating RN: January 20, 1956 (67 y.o. [name] Primary Care Provider: [name] Crutch Other Clinician: Betha Loa Referring Provider: Treating Provider/Extender: Wynona Dove in Treatment: 42 Information Obtained from: Patient Chief Complaint Bilateral LE lymphedema with Left LE Ulcers Electronic Signature(s) Signed: 05/21/2023 10:54:20 AM By: Allen Derry PA-C Entered By: Allen Derry on 05/21/2023 07:54:20 -------------------------------------------------------------------------------- Problem List Details Patient Name: Date of Service: [name], [name] 05/21/2023 10:45 A M Medical Record Number: [id number] Patient Account Number: [id number] Date of Birth/Sex: Treating RN: June 17, 1956 (67 y.o. 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

## 2023-05-21 NOTE — Patient Instructions (Signed)
I have made a referral to the health coach and she will reach out to you.    If you receive a satisfaction survey regarding the [facility], please take the time to fill it out. This way we can continue to provide excellent care and make any changes that need to be made.    Keep up the good work!

## 2023-05-21 NOTE — Progress Notes (Signed)
SATORI, GUIN ([id number]) [id number] Page 1 of 8 Visit Report for 05/21/2023 Arrival Information Details Patient Name: Date of Service: [name], [name] 05/21/2023 10:45 A M Medical Record Number: [id number] Patient Account Number: [id number] Date of Birth/Sex: Treating RN: [birth year] (67 y.o. [name] Primary Care Gusta Marksberry: [name] Crutch Other Clinician: Betha Loa Referring Jenise Iannelli: Treating Conan Mcmanaway/Extender: Wynona Dove in Treatment: 70 Visit Information History Since Last Visit All ordered tests and consults were completed: No Patient Arrived: Dan Humphreys Added or deleted any medications: No Arrival Time: 11:09 Any new allergies or adverse reactions: No Transfer Assistance: None Had a fall or experienced change in No Patient Identification Verified: Yes activities of daily living that may affect Secondary Verification Process Completed: Yes risk of falls: Patient Requires Transmission-Based Precautions: No Signs or symptoms of abuse/neglect since last visito No Patient Has Alerts: Yes Hospitalized since last visit: No Patient Alerts: Borderline Diabetic Implantable device outside of the clinic excluding No cellular tissue based products placed in the center since last visit: Pain Present Now: Yes Electronic Signature(s) Signed: 05/21/2023 5:03:47 PM By: Betha Loa Entered By: Betha Loa on 05/21/2023 08:14:12 -------------------------------------------------------------------------------- Clinic Level of Care Assessment Details Patient Name: Date of Service: TATHIANA, RESNIK 05/21/2023 10:45 A M Medical Record Number: [id number] Patient Account Number: [id number] Date of Birth/Sex: Treating RN: 09-26-1955 (67 y.o. [name] Primary Care Delta Deshmukh: [name] Crutch Other Clinician: Betha Loa Referring Meila Berke: Treating Noal Abshier/Extender: Wynona Dove in Treatment:  94 Clinic Level of Care Assessment Items TOOL 4 Quantity Score []  - 0 Use when only an EandM is performed on FOLLOW-UP visit ASSESSMENTS - Nursing Assessment / Reassessment X- 1 10 Reassessment of Co-morbidities (includes updates in patient status) X- 1 5 Reassessment of Adherence to Treatment Plan [name], [name] ([id number]) [id number] Page 2 of 8 ASSESSMENTS - Wound and Skin A ssessment / Reassessment []  - Simple Wound Assessment / Reassessment - one wound 0 []  - 0 Complex Wound Assessment / Reassessment - multiple wounds X- 1 10 Dermatologic / Skin Assessment (not related to wound area) ASSESSMENTS - Focused Assessment []  - 0 Circumferential Edema Measurements - multi extremities []  - 0 Nutritional Assessment / Counseling / Intervention []  - 0 Lower Extremity Assessment (monofilament, tuning fork, pulses) []  - 0 Peripheral Arterial Disease Assessment (using hand held doppler) ASSESSMENTS - Ostomy and/or Continence Assessment and Care []  - 0 Incontinence Assessment and Management []  - 0 Ostomy Care Assessment and Management (repouching, etc.) PROCESS - Coordination of Care X - Simple Patient / Family Education for ongoing care 1 15 []  - 0 Complex (extensive) Patient / Family Education for ongoing care []  - 0 Staff obtains Chiropractor, Records, T Results / Process Orders est []  - 0 Staff telephones HHA, Nursing Homes / Clarify orders / etc []  - 0 Routine Transfer to another Facility (non-emergent condition) []  - 0 Routine Hospital Admission (non-emergent condition) []  - 0 New Admissions / Manufacturing engineer / Ordering NPWT Apligraf, etc. , []  - 0 Emergency Hospital Admission (emergent condition) X- 1 10 Simple Discharge Coordination []  - 0 Complex (extensive) Discharge Coordination PROCESS - Special Needs []  - 0 Pediatric / Minor Patient Management []  - 0 Isolation Patient Management []  - 0 Hearing / Language / Visual special  needs []  - 0 Assessment of Community assistance (transportation, D/C planning, etc.) []  - 0 Additional assistance / Altered mentation []  - 0 Support Surface(s) Assessment (bed, cushion, seat, etc.) INTERVENTIONS - Wound Cleansing / Measurement []  -  0 Simple Wound Cleansing - one wound []  - 0 Complex Wound Cleansing - multiple wounds []  - 0 Wound Imaging (photographs - any number of wounds) []  - 0 Wound Tracing (instead of photographs) []  - 0 Simple Wound Measurement - one wound []  - 0 Complex Wound Measurement - multiple wounds INTERVENTIONS - Wound Dressings []  - 0 Small Wound Dressing one or multiple wounds []  - 0 Medium Wound Dressing one or multiple wounds []  - 0 Large Wound Dressing one or multiple wounds []  - 0 Application of Medications - topical []  - 0 Application of Medications - injection INTERVENTIONS - Miscellaneous []  - 0 External ear exam [name], [name] ([id number]) [id number] Page 3 of 8 []  - 0 Specimen Collection (cultures, biopsies, blood, body fluids, etc.) []  - 0 Specimen(s) / Culture(s) sent or taken to Lab for analysis []  - 0 Patient Transfer (multiple staff / Michiel Sites Lift / Similar devices) []  - 0 Simple Staple / Suture removal (25 or less) []  - 0 Complex Staple / Suture removal (26 or more) []  - 0 Hypo / Hyperglycemic Management (close monitor of Blood Glucose) []  - 0 Ankle / Brachial Index (ABI) - do not check if billed separately X- 1 5 Vital Signs Has the patient been seen at the hospital within the last three years: Yes Total Score: 55 Level Of Care: New/Established - Level 2 Electronic Signature(s) Signed: 05/21/2023 5:03:47 PM By: Betha Loa Entered By: Betha Loa on 05/21/2023 08:37:45 -------------------------------------------------------------------------------- Encounter Discharge Information Details Patient Name: Date of Service: [name] 05/21/2023 10:45 A M Medical Record  Number: [id number] Patient Account Number: [id number] Date of Birth/Sex: Treating RN: Jul 28, 1955 (67 y.o. [name] Primary Care Alexander Aument: [name] Crutch Other Clinician: Betha Loa Referring Rahman Ferrall: Treating Taydon Nasworthy/Extender: Wynona Dove in Treatment: 52 Encounter Discharge Information Items Discharge Condition: Stable Ambulatory Status: Walker Discharge Destination: Home Transportation: Other Accompanied By: self Schedule Follow-up Appointment: Yes Clinical Summary of Care: Electronic Signature(s) Signed: 05/21/2023 5:03:47 PM By: Betha Loa Entered By: Betha Loa on 05/21/2023 08:39:28 -------------------------------------------------------------------------------- Multi Wound Chart Details Patient Name: Date of Service: [name], [name] 05/21/2023 10:45 A Mylinda Latina ([id number]) [id number] Page 4 of 8 Medical Record Number: [id number] Patient Account Number: [id number] Date of Birth/Sex: Treating RN: Nov 07, 1955 (66 y.o. [name] Primary Care Madden Garron: [name] Crutch Other Clinician: Betha Loa Referring Kierria Feigenbaum: Treating Beyounce Dickens/Extender: Wynona Dove in Treatment: 54 Vital Signs Height(in): 63 Pulse(bpm): 81 Weight(lbs): 372 Blood Pressure(mmHg): 116/81 Body Mass Index(BMI): 65.9 Temperature(F): 97.9 Respiratory Rate(breaths/min): 18 [Treatment Notes:Wound Assessments Treatment Notes] Electronic Signature(s) Signed: 05/21/2023 5:03:47 PM By: Betha Loa Entered By: Betha Loa on 05/21/2023 08:22:54 -------------------------------------------------------------------------------- Multi-Disciplinary Care Plan Details Patient Name: Date of Service: [name], [name] 05/21/2023 10:45 A M Medical Record Number: [id number] Patient Account Number: [id number] Date of Birth/Sex: Treating RN: Nov 22, 1955 (67 y.o. [name] Primary Care Kai Railsback: [name] Crutch Other Clinician: Betha Loa Referring Aubryana Vittorio: Treating Avenir Lozinski/Extender: Wynona Dove in Treatment: 1 Active Inactive Wound/Skin Impairment Nursing Diagnoses: Impaired tissue integrity Knowledge deficit related to ulceration/compromised skin integrity Goals: Ulcer/skin breakdown will have a volume reduction of 30% by week 4 Date Initiated: 08/01/2021 Target Resolution Date: 08/29/2021 Goal Status: Active Ulcer/skin breakdown will have a volume reduction of 50% by week 8 Date Initiated: 08/01/2021 Target Resolution Date: 09/26/2021 Goal Status: Active Ulcer/skin breakdown will have a volume reduction of 80% by week 12 Date Initiated: 08/01/2021 Target Resolution Date: 10/24/2021  Goal Status: Active Ulcer/skin breakdown will heal within 14 weeks Date Initiated: 08/01/2021 Target Resolution Date: 11/07/2021 Goal Status: Active Interventions: Assess patient/caregiver ability to obtain necessary supplies Assess patient/caregiver ability to perform ulcer/skin care regimen upon admission and as needed Assess ulceration(s) every visit Provide education on ulcer and skin care Notes: [name], [name] ([id number]) [id number] Page 5 of 8 Electronic Signature(s) Signed: 05/21/2023 4:52:48 PM By: Midge Aver MSN RN CNS WTA Signed: 05/21/2023 5:03:47 PM By: Betha Loa Entered By: Betha Loa on 05/21/2023 08:37:59 -------------------------------------------------------------------------------- Non-Wound Condition Assessment Details Patient Name: Date of Service: [name], [name] 05/21/2023 10:45 A M Medical Record Number: [id number] Patient Account Number: [id number] Date of Birth/Sex: Treating RN: Sep 28, 1955 (67 y.o. [name] Primary Care Mykel Mohl: [name] Crutch Other Clinician: Betha Loa Referring Lindzy Rupert: Treating Channie Bostick/Extender: Wynona Dove in Treatment: 23 Non-Wound  Condition: Condition: Lymphedema Location: Leg Side: Right Notes: Right leg with decreased drainage this visit. Notes No drainage, redness, or odor noted. Electronic Signature(s) Signed: 05/21/2023 4:52:48 PM By: Midge Aver MSN RN CNS WTA Signed: 05/21/2023 5:03:47 PM By: Betha Loa Entered By: Betha Loa on 05/21/2023 08:22:11 -------------------------------------------------------------------------------- Non-Wound Condition Assessment Details Patient Name: Date of Service: [name], [name] 05/21/2023 10:45 A M Medical Record Number: [id number] Patient Account Number: [id number] Date of Birth/Sex: Treating RN: Mar 16, 1956 (67 y.o. [name] Primary Care Breda Bond: [name] Crutch Other Clinician: Betha Loa Referring Maanasa Aderhold: Treating Courtland Coppa/Extender: Wynona Dove in Treatment: 66 Non-Wound Condition: Condition: Lymphedema Location: Leg Side: Left Notes: decreased drainage this visit. Notes No drainage, redness, or odor noted. [name], [name] ([id number]) [id number] Page 6 of 8 Electronic Signature(s) Signed: 05/21/2023 4:52:48 PM By: Midge Aver MSN RN CNS WTA Signed: 05/21/2023 5:03:47 PM By: Betha Loa Entered By: Betha Loa on 05/21/2023 08:22:16 -------------------------------------------------------------------------------- Pain Assessment Details Patient Name: Date of Service: [name], [name] 05/21/2023 10:45 A M Medical Record Number: [id number] Patient Account Number: [id number] Date of Birth/Sex: Treating RN: 25-Apr-1956 (67 y.o. [name] Primary Care Emmalia Heyboer: [name] Crutch Other Clinician: Betha Loa Referring Johnette Teigen: Treating Grantley Savage/Extender: Wynona Dove in Treatment: 8 Active Problems Location of Pain Severity and Description of Pain Patient Has Paino Yes Site Locations Pain Location: Generalized Pain Duration of the Pain. Constant /  Intermittento Constant Rate the pain. Current Pain Level: 7 Character of Pain Describe the Pain: Aching Pain Management and Medication Current Pain Management: Medication: Yes Cold Application: No Rest: No Massage: No Activity: No T.E.N.S.: No Heat Application: No Leg drop or elevation: No Is the Current Pain Management Adequate: Inadequate How does your wound impact your activities of daily livingo Sleep: No Bathing: No Appetite: No Relationship With Others: No Bladder Continence: No Emotions: No Bowel Continence: No Work: No Toileting: No Drive: No Dressing: No Hobbies: No Electronic Signature(s) Signed: 05/21/2023 4:52:48 PM By: Midge Aver MSN RN CNS WTA Signed: 05/21/2023 5:03:47 PM By: Mia Creek ([id number]) [id number] Page 7 of 8 Entered By: Betha Loa on 05/21/2023 08:17:41 -------------------------------------------------------------------------------- Patient/Caregiver Education Details Patient Name: Date of Service: [name], [name] 11/12/2024andnbsp10:45 A M Medical Record Number: [id number] Patient Account Number: [id number] Date of Birth/Gender: Treating RN: 04-28-1956 (67 y.o. [name] Primary Care Physician: [name] Crutch Other Clinician: Betha Loa Referring Physician: Treating Physician/Extender: Wynona Dove in Treatment: 41 Education Assessment Education Provided To: Patient Education Topics Provided Wound/Skin Impairment: Handouts: Other: continue monitoring for any changes in lower legs Methods: Explain/Verbal  Responses: State content correctly Electronic Signature(s) Signed: 05/21/2023 5:03:47 PM By: Betha Loa Entered By: Betha Loa on 05/21/2023 08:38:32 -------------------------------------------------------------------------------- Vitals Details Patient Name: Date of Service: [name] 05/21/2023 10:45 A M Medical Record  Number: [id number] Patient Account Number: [id number] Date of Birth/Sex: Treating RN: January 18, 1956 (67 y.o. [name] Primary Care Seger Jani: [name] Crutch Other Clinician: Betha Loa Referring Frankie Scipio: Treating Tava Peery/Extender: Wynona Dove in Treatment: 36 Vital Signs Time Taken: 11:17 Temperature (F): 97.9 Height (in): 63 Pulse (bpm): 81 Weight (lbs): 372 Respiratory Rate (breaths/min): 18 Body Mass Index (BMI): 65.9 Blood Pressure (mmHg): 116/81 Reference Range: 80 - 120 mg / dl Electronic Signature(s) [name], [name] ([id number]) [id number] Page 8 of 8 Signed: 05/21/2023 5:03:47 PM By: Betha Loa Entered By: Betha Loa on 05/21/2023 08:17:36

## 2023-05-22 ENCOUNTER — Other Ambulatory Visit: Payer: Self-pay

## 2023-05-22 MED ORDER — WEGOVY 0.5 MG/0.5ML ~~LOC~~ SOAJ: 0.5000 mg | SUBCUTANEOUS | 3 refills | Status: DC

## 2023-05-23 ENCOUNTER — Telehealth: Payer: Self-pay | Admitting: Family

## 2023-05-28 ENCOUNTER — Telehealth: Payer: Self-pay

## 2023-05-28 DIAGNOSIS — Z0000 Encounter for general adult medical examination without abnormal findings: Secondary | ICD-10-CM

## 2023-05-28 NOTE — Telephone Encounter (Signed)
Called patient per health coaching referral for healthy eating habits. Patient expressed that she is interested in participating in health coaching over the phone. Patient mentioned that her eating habits are stress related. Patient addressed was verified. Best number to call is listed as home. Patient will be called on 11/26 at 3:00pm.  Renaee Munda, MS, ERHD, [facility]  Care Guide, Health & Wellness Coach [address], [address] [place] [address] Telephone: [phone] Email: [email or web address]

## 2023-06-04 ENCOUNTER — Telehealth: Payer: Self-pay

## 2023-06-04 ENCOUNTER — Ambulatory Visit: Payer: Medicare Other | Attending: Internal Medicine

## 2023-06-04 DIAGNOSIS — Z0000 Encounter for general adult medical examination without abnormal findings: Secondary | ICD-10-CM

## 2023-06-04 NOTE — Telephone Encounter (Signed)
Returned patient's call. Patient wanted to confirm that her health coaching appointment would be telephonic. Informed patient that our initial health coaching appointment is scheduled today at 3:00pm and will be held over the phone. Patient verbally expressed understanding and is expecting a phone call at that time.   Renaee Munda, MS, ERHD, [facility]  Care Guide, Health & Wellness Coach [address], [address] [place] [address] Telephone: [phone] Email: [email or web address]

## 2023-06-04 NOTE — Progress Notes (Signed)
HEALTH & WELLNESS COACHING INITIAL INTAKE   Appointment Outcome: Completed, Session #: Initial                        Start time: 3:03pm   End time: 4:10pm   Total Mins: 67 minutes     What are the Patient's goals from Coaching? Patient wants to improve healthy eating behaviors to aid in weight loss and overall improvement of health.     Why did they seek coaching now? Patient is seeking support and accountability in improving her healthy eating habits to lose weight. Patient has made some behavior changes around eating healthier and wants to work on additional changes, while maintaining consistency.    Readiness - What stage is the patient in regarding their goal(s)? Patient is in action stage of improving healthy eating habits.      Coaching Progress Notes: Patient reported that she is currently taking Wegovy and the eating behaviors that she has changed over time. Patient mentioned that she has been taking Wegovy since Sept 23rd and that after taking her weekly shot, she feels nauseous and tired the first three days. Patient mentioned that the medication has changed her appetite and reduce the volume of food that she can consume during a meal.   Patient stated that she has tried to eat on a schedule like that of being in the hospital. Patient shared that she was able to maintain this schedule for a while, but it would be impacted by changes in her sleep hygiene. Patient reported that since she doesn't have much of an appetite, she has been experiencing low glucose levels. Patient mentioned that at times her eating habits are influenced by stress. Patient mentioned that at those times, she is prone to eating more carbs. Patient stated that she finds that she eats larger portions because she is mindlessly eating at that time.   Patient expressed interest in eating smaller meals throughout the day to maintain a stable glucose level. Patient stated that she is aware of the DASH and Med  diets. Patient shared that she was encouraged to eat more protein by another medical provider. Patient mentioned that she wants to lose weight to improve her health outcomes by managing her eating habits. Patient stated that she needs support with being consistent and figuring out what behaviors work best for her goal.     Coaching Outcomes Patient has a follow up appointment to her initial session to refine her overall goals and action steps on 12/6. Patient will continue to implement the strategies as outlined below that she has implemented to improve her healthy eating habits.     AGREEMENTS SECTION   Overall Goal(s): Improve healthy eating behaviors over the next 3 months to aid in weight loss and stabilize glucose levels.                        Agreement/Action Steps:  Improve healthy eating behaviors Meal planning for 2-3 days at a time Practice portion control by using the diabetic plate method Take [facility] as prescribed Read food label to monitor sodium and calories       Patient review of Health Coaching Agreement Reviewed Coaching Agreement and Code of Ethics with Patient during initial session. Answered any questions the patient had if any regarding the Coaching Agreement and Code of Ethics. Patient verbally agreed to adhere to the Coaching Agreement and to abide by the Code of Ethics.  Mailed patient with a hard/electronic copy of the Coaching Agreement and Code of Ethics.     Resources: Mailed patient a copy of meal planning sheets and diabetic plate method.

## 2023-06-12 ENCOUNTER — Other Ambulatory Visit: Payer: Self-pay | Admitting: Family

## 2023-06-14 ENCOUNTER — Ambulatory Visit: Payer: Medicare HMO | Attending: Cardiology

## 2023-06-14 DIAGNOSIS — Z0000 Encounter for general adult medical examination without abnormal findings: Secondary | ICD-10-CM

## 2023-06-14 NOTE — Progress Notes (Unsigned)
Appointment Outcome:  Completed, Session #: Initial f/u (1-week)                       Start time: 3:03pm   End time: 3:45pm   Total Mins: 42 minutes  AGREEMENTS SECTION   Overall Goal(s): Improve healthy eating behaviors over the next 3 months to aid in weight loss and stabilize glucose levels.                            Agreement/Action Steps:  Improve healthy eating behaviors Meal planning for 2-3 days at a time Practice portion control by using the diabetic plate method Take [facility] as prescribed Read food label to monitor sodium and calories    Progress Notes:  Review the week(s) since last session. Use what was learned since the last session to help plan for the current session. Add notes about key discussion points, perspectives and/or observations that occurred in the session. What will the patient change, add, delete, etc.?      Indicators of Success and Accountability:  What are the mile markers along your path to reaching your desired changes   Readiness: How ready are you to make the changes you have identified?  Rate your readiness on a 1-5 scale with 5 being the readiest.   Strengths and Supports: Who will support you? What can you rely on? Personal strengths that you will tap into?  Challenges and Barriers: What could block you or prevent you meeting your goal?     Coaching Outcomes: Summarize only what is needed to help you and the patient with their progress.      Attempted: Fulfilled - patient completed the bi-weekly agreement in full and was able to meet the challenge  Partial - patient did something towards the agreement- took a shot yet was unable to fully complete. E.g., agreement to exercise was 3X this week, but was only to the gym 1 time. Patient did more than what they previously did  Not met - patient did not attempt the agreement and did not express concern   Not attempted: Deferred - patient expressed the agreement is important but  unable to work towards this week and want to continue on the path with this agreement. i.e., deferring to another time, or patient determined not an appropriate time to try due to personal schedule, needs, priorities, etc.   Dropped/Revised - patient determined this was not the best endeavor and decided to revise the agreement for a better fit or to drop    Referrals: Any referrals or follow-up resources discussed in the coaching session

## 2023-06-18 ENCOUNTER — Ambulatory Visit: Payer: Medicare Other | Attending: Family | Admitting: Family

## 2023-06-18 ENCOUNTER — Encounter: Payer: Self-pay | Admitting: Family

## 2023-06-18 ENCOUNTER — Encounter: Payer: Medicare Other | Attending: Physician Assistant | Admitting: Physician Assistant

## 2023-06-18 VITALS — BP 122/88 | HR 91 | Wt 328.5 lb

## 2023-06-18 DIAGNOSIS — Z79899 Other long term (current) drug therapy: Secondary | ICD-10-CM | POA: Insufficient documentation

## 2023-06-18 DIAGNOSIS — I130 Hypertensive heart and chronic kidney disease with heart failure and stage 1 through stage 4 chronic kidney disease, or unspecified chronic kidney disease: Secondary | ICD-10-CM | POA: Diagnosis not present

## 2023-06-18 DIAGNOSIS — Q820 Hereditary lymphedema: Secondary | ICD-10-CM | POA: Diagnosis not present

## 2023-06-18 DIAGNOSIS — G4733 Obstructive sleep apnea (adult) (pediatric): Secondary | ICD-10-CM | POA: Insufficient documentation

## 2023-06-18 DIAGNOSIS — I110 Hypertensive heart disease with heart failure: Secondary | ICD-10-CM | POA: Insufficient documentation

## 2023-06-18 DIAGNOSIS — L97821 Non-pressure chronic ulcer of other part of left lower leg limited to breakdown of skin: Secondary | ICD-10-CM | POA: Diagnosis not present

## 2023-06-18 DIAGNOSIS — Z7984 Long term (current) use of oral hypoglycemic drugs: Secondary | ICD-10-CM | POA: Diagnosis not present

## 2023-06-18 DIAGNOSIS — I5032 Chronic diastolic (congestive) heart failure: Secondary | ICD-10-CM

## 2023-06-18 DIAGNOSIS — L97811 Non-pressure chronic ulcer of other part of right lower leg limited to breakdown of skin: Secondary | ICD-10-CM | POA: Diagnosis present

## 2023-06-18 DIAGNOSIS — G4730 Sleep apnea, unspecified: Secondary | ICD-10-CM | POA: Diagnosis not present

## 2023-06-18 DIAGNOSIS — N183 Chronic kidney disease, stage 3 unspecified: Secondary | ICD-10-CM | POA: Insufficient documentation

## 2023-06-18 DIAGNOSIS — I890 Lymphedema, not elsewhere classified: Secondary | ICD-10-CM

## 2023-06-18 DIAGNOSIS — Z6841 Body Mass Index (BMI) 40.0 and over, adult: Secondary | ICD-10-CM | POA: Diagnosis not present

## 2023-06-18 DIAGNOSIS — I10 Essential (primary) hypertension: Secondary | ICD-10-CM | POA: Diagnosis not present

## 2023-06-18 DIAGNOSIS — E11622 Type 2 diabetes mellitus with other skin ulcer: Secondary | ICD-10-CM | POA: Insufficient documentation

## 2023-06-18 MED ORDER — WEGOVY 1 MG/0.5ML ~~LOC~~ SOAJ: 1.0000 mg | SUBCUTANEOUS | 2 refills | Status: DC

## 2023-06-18 NOTE — Patient Instructions (Signed)
Keep up the great work! Have a [place] Christmas!

## 2023-06-18 NOTE — Progress Notes (Signed)
PCP: [facility] (last seen 08/24) Primary Cardiologist: Marcina Millard, MD/ [facility], [place] (last seen 10/24; returns 04/25)  HPI:  [name] is a 67 y/o female with a history of asthma, HTN, arthritis, lymphedema, CKD, secondary hyperparathyroidism, sleep apnea, anemia, spinal stenosis, obesity, previous tobacco use and chronic heart failure.   Admitted 05/10/22 due to worsening edema and drainage. Patient evaluated and found to have cellulitis of the right lower extremity. Had AKI. Vascular and wound consults obtained. Antibiotics started. Became symptomatic with elevated lactic acid and WBC. IVF given and antibiotic changed. CT scan 11/7 did not show any drainable fluid collection. Discharged after 6 days.   Echo 04/17/18: EF 65% with PA pressure of 43 mmHg Echo 04/13/19: EF 55-60% with mild LAE and trace MR Echo 11/01/21: EF of 60-65%.   She presents today for a HF follow-up visit with a chief complaint of moderate fatigue with minimal exertion although she feels like it's improving. Has associated intermittent palpitations, intermittent dizziness and chronic lymphedema along with this. Denies shortness of breath, chest pain, cough, abdominal distention or weight gain.   At last visit, wegovy was increased to 0.5mg  weekly.She says that she's tolerated it well except for last week when she ate some higher fatty foods which then caused nausea and abdominal discomfort for a few days. Says that talking with the health coach has been helpful so far.   ROS: All systems negative except as listed in HPI, PMH and Problem List.  SH:  Social History   Socioeconomic History   Marital status: Single    Spouse name: Not on file   Number of children: Not on file   Years of education: Not on file   Highest education level: Not on file  Occupational History   Not on file  Tobacco Use   Smoking status: Former    Current packs/day: 0.00    Average packs/day: 0.3 packs/day for 14.0 years (3.5  ttl pk-yrs)    Types: Cigarettes    Start date: 07/09/1976    Quit date: 07/09/1990    Years since quitting: 32.9   Smokeless tobacco: Current    Types: Snuff  Vaping Use   Vaping status: Never Used  Substance and Sexual Activity   Alcohol use: No   Drug use: No   Sexual activity: Not Currently  Other Topics Concern   Not on file  Social History Narrative   Not on file   Social Determinants of Health   Financial Resource Strain: Not on file  Food Insecurity: No Food Insecurity (09/06/2022)   [facility]   Hunger Vital Sign    Worried About Running Out of Food in the Last Year: Never true    Ran Out of Food in the Last Year: Never true  Transportation Needs: No Transportation Needs (09/06/2022)   [facility]   [facility] - Transportation    Lack of Transportation (Medical): No    Lack of Transportation (Non-Medical): No  Physical Activity: Not on file  Stress: Not on file  Social Connections: Not on file  Intimate Partner Violence: Not At Risk (05/10/2022)   Humiliation, Afraid, Rape, and Kick questionnaire    Fear of Current or Ex-Partner: No    Emotionally Abused: No    Physically Abused: No    Sexually Abused: No    FH:  Family History  Problem Relation Age of Onset   Breast cancer Maternal Aunt    Thyroid disease  Other     Past Medical History:  Diagnosis Date   Arthritis    Asthma    CHF (congestive heart failure) (HCC)    Enlarged heart    Hypertension    Lymphedema    Sleep apnea    Spinal stenosis     Current Outpatient Medications  Medication Sig Dispense Refill   acetaminophen (TYLENOL) 500 MG tablet Take 500-1,000 mg by mouth every 6 (six) hours as needed for mild pain or fever.     albuterol (VENTOLIN HFA) 108 (90 Base) MCG/ACT inhaler Inhale 1-2 puffs into the lungs every 6 (six) hours as needed for wheezing or shortness of breath.     bumetanide (BUMEX) 2 MG tablet Take 1  tablet (2 mg total) by mouth daily. Stop Furosemide 30 tablet 4   calcitRIOL (ROCALTROL) 0.25 MCG capsule Take 0.25 mcg by mouth daily.     cetirizine (ZYRTEC) 10 MG tablet Take 1 tablet (10 mg total) by mouth daily. (Patient taking differently: Take 10 mg by mouth daily as needed for allergies.) 60 tablet 0   dapagliflozin propanediol (FARXIGA) 10 MG TABS tablet Take 1 tablet (10 mg total) by mouth daily before breakfast. 30 tablet 11   docusate sodium (COLACE) 100 MG capsule Take 100 mg by mouth 2 (two) times daily.     KLOR-CON M20 20 MEQ tablet Take 2 tablets (40 mEq total) by mouth daily. 180 tablet 2   metolazone (ZAROXOLYN) 5 MG tablet Take 0.5 tablets (2.5 mg total) by mouth 2 (two) times a week. 12 tablet 2   mupirocin ointment (BACTROBAN) 2 % Apply 1 Application topically daily as needed (wound care).     NYSTATIN powder Apply 1 Application topically 2 (two) times daily as needed (Yeast Skin infection).     Semaglutide-Weight Management (WEGOVY) 0.25 MG/0.5ML SOAJ Inject 0.25 mg into the skin once a week. 2 mL 0   Semaglutide-Weight Management (WEGOVY) 0.5 MG/0.5ML SOAJ Inject 0.5 mg into the skin once a week. 2 mL 3   senna-docusate (SENOKOT-S) 8.6-50 MG tablet Take 1 tablet by mouth 2 (two) times daily as needed for mild constipation or moderate constipation.     spironolactone (ALDACTONE) 25 MG tablet TAKE 0.5 TABLET BY MOUTH ONCE DAILY. 15 tablet 6   No current facility-administered medications for this visit.   Vitals:   06/18/23 0950  BP: 122/88  Pulse: 91  SpO2: 98%  Weight: (!) 328 lb 8 oz (149 kg)   Wt Readings from Last 3 Encounters:  06/18/23 (!) 328 lb 8 oz (149 kg)  05/21/23 (!) 333 lb (151 kg)  04/16/23 (!) 332 lb (150.6 kg)   Lab Results  Component Value Date   CREATININE 1.81 (H) 08/31/2022   CREATININE 1.28 (H) 08/03/2022   CREATININE 1.32 (H) 07/23/2022   PHYSICAL EXAM:  General:  Well appearing. No resp difficulty HEENT: normal Neck: supple. JVP  flat. No lymphadenopathy or thryomegaly appreciated. Cor: PMI normal. Regular rate & rhythm. No rubs, gallops or murmurs. Lungs: clear Abdomen: soft, nontender, nondistended. No hepatosplenomegaly. No bruits or masses.  Extremities: no cyanosis, clubbing, rash. + bilateral lymphedema Neuro: alert & oriented x3, cranial nerves grossly intact. Moves all 4 extremities w/o difficulty. Affect pleasant.   ECG: not done   ASSESSMENT & PLAN:  1: Chronic heart failure with preserved ejection fraction- - likely HTN/ sleep apnea - NYHA class III - euvolemic today - weight down 5 pounds from last visit here 1 month ago - Echo 04/17/18:  EF 65% with PA pressure of 43 mmHg - Echo 04/13/19: EF 55-60% with mild LAE and trace MR - Echo 11/01/21: EF of 60-65%. - reviewed the importance of keeping her sodium 2000mg  as well as keeping daily fluid intake to 60-64 ounces/ day - facial/mouth swelling with ACEi - continue bumex 2mg  QOD - continue farxiga 10mg   - continue potassium daily - continue metolazone 2.5mg  twice weekly - continue spironolactone 12.5mg  daily - saw cardiology [name]) 10/24 - BNP 10/31/21 was 123.9  2: HTN with stage 3 CKD- - BP 122/88 - sees PCP [name]) @ [facility]  - saw nephrology [name]) 11/24  - BMP 05/16/23 reviewed and showed sodium 137, potassium 3.9, creatinine 1.6 and GFR 35  3: Lymphedema- - saw wound center last month; returns later today  4: Sleep apnea- - not wearing CPAP nightly due to having water in the mask; insurance will not allow another CPAP machine until 2025 - saw pulmonology [name]) 04/24  5: Morbid obesity- - BMI 04/16/23 was 58.06 which is down from 61.90  - has lost 33 pounds since May 2024; trying to increase her activity when she's able - increase wegovy to 1mg  weekly; reminded to limit fried/ fatty foods to lessen the chance of nausea - continues to participate in health coach and finds it helpful - unable to attend [place] [facility]  clinic due to transportation issues   Return in 1 month, sooner if needed.

## 2023-06-20 NOTE — Progress Notes (Signed)
[name], [name] ([id number]) [id number] Page 1 of 15 Visit Report for 06/18/2023 Chief Complaint Document Details Patient Name: Date of Service: [name], [name] 06/18/2023 10:45 A M Medical Record Number: [id number] Patient Account Number: [id number] Date of Birth/Sex: Treating RN: 21-Mar-1956 (67 y.o. [name] Primary Care Provider: [name] Crutch Other Clinician: Betha Loa Referring Provider: Treating Provider/Extender: Wynona Dove in Treatment: 82 Information Obtained from: Patient Chief Complaint Bilateral LE lymphedema with Left LE Ulcers Electronic Signature(s) Signed: 06/18/2023 1:46:49 PM By: Allen Derry PA-C Entered By: Allen Derry on 06/18/2023 13:46:49 -------------------------------------------------------------------------------- HPI Details Patient Name: Date of Service: Elizabeth, [name] 06/18/2023 10:45 A M Medical Record Number: [id number] Patient Account Number: [id number] Date of Birth/Sex: Treating RN: 1955-12-15 (67 y.o. [name] Primary Care Provider: [name] Crutch Other Clinician: Betha Loa Referring Provider: Treating Provider/Extender: Wynona Dove in Treatment: 96 History of Present Illness HPI Description: The patient is a 67 year old female with history of hypertension and a long-standing history of bilateral lower extremity lymphedema (first presented on 4/2) . She has had open ulcers in the past which have always responded to compression therapy. She had briefly been to a lymphedema clinic in the past which helped her at the time. this time around she stopped treatment of her lymphedema pumps approximately 2 weeks ago because of some pain in the knees and then noticed the right leg getting worse. She was seen by her PCP who put her on clindamycin 4 times a day 2 days ago. The patient has seen [facility] and Dr. Gilda Crease had seen her last year where a vascular  study including venous and arterial duplex studies were within normal limits. he had recommended compression stockings and lymphedema pumps and the patient has been using this in about 2 weeks ago. She is known to be diabetic but in the past few time she's gone to her primary care doctor her hemoglobin A1c has been normal. 02/11/2015 - after her last visit she took my advice and went to the ER regarding the progressive cellulitis of her right lower extremity and she was admitted between July 17 and 22nd. She received IV antibiotics and then was sent home on a course of steroid-induced and oral antibiotics. She has improved much since then. 02/17/2015 -- she has been doing fine and the weeping of her legs has remarkably gone down. She has no fresh issues. [name], [name] ([id number]) [id number] Page 2 of 15 01/15/18 This patient was given this clinic before most recently in 2016 seen by Dr. Meyer Russel. She has massive bilateral lymphedema and over the last 2 months this had weeping edema out of the left leg. She has compression pumps but her compliance with these has been minimal. She has [facility] they've been using TCA/ABDs/kerlix under an Ace wrap.she has had recent problems with cellulitis. She was apparently seen in the ER and 12/23/17 and given clindamycin. She was then followed by her primary doctor and given doxycycline and Keflex. The pain seems to have settled down. In April 2018 the patient had arterial studies done at [facility]. This showed triphasic waveforms throughout the right leg and mostly triphasic waveforms on the left except for monophasic at the posterior tibial artery distally. She was not felt to have evidence of right lower extremity arterial stenosis or significant problems on the left side. 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 She also had a right lower extremity  venous Doppler in  January 2018 this was limited by the patient's body habitus and lymphedema. Most of the proximal veins were not visualized The patient presents with an area of denuded skin on the anterior medial part of the left calf. There is weeping edema fluid here. 01/22/18; the patient has somewhat better edema control using her compression pumps twice a day and as a result she has much better epithelialization on the left anterior calf area. Only a small open area remains. 01/29/18; the patient has been compliant with her compression pumps. Both the areas on her calf that healed. The remaining area on the left anterior leg is fully epithelialized Readmission: 02/20/2019 upon evaluation today patient presents for reevaluation due to issues that she is having with the bilateral lower extremities. She actually has wounds open on both legs. On the right she has an area in the crease of her leg on the right around the knee region which is actually draining quite a bit and actually has some fungal type appearance to it. She has been on nystatin powder that seems to have helped to some degree. In regard to the left lower extremity this is actually in the lower portion of her leg closer to the ankle and again is continuing to drain as well unfortunately. There does not appear to be any signs of active infection at this time which is good news. No fevers, chills, nausea, vomiting, or diarrhea. She tells me that since she was seen last year she is actually been doing quite well for the most part with regard to her lower extremities. Unfortunately she now is experiencing a little bit more drainage at this time. She is concerned about getting this under control so that it does not get significantly worse. 02/27/2019 on evaluation today patient appears to be doing somewhat better in regard to her bilateral lower extremity wounds. She has been tolerating the dressing changes without complication. Fortunately there is no signs of  active infection at this point. No fevers, chills, nausea, vomiting, or diarrhea. She did get her dressing supplies which is excellent news she was extremely excited to get these. She also got paperwork from [employer] for their financial assistance program where they may be able to help her out in the future if needed with supplies at discounted prices. 03/06/2019 on evaluation today patient appears to be doing a little worse with regard to both areas of weeping on her bilateral lower extremities. This is around the right medial knee and just above the left ankle. With that being said she is unfortunately not doing as well as I would like to see. I feel like she may need to potentially go see someone at the lymphedema clinic as the wraps that she needs or even beyond what we can do here at the wound care center. She really does not have wounds she just has open areas of weeping that are causing some difficulty for her. Subsequently because of this and the moisture I am concerned about the potential for infection I am going to likely give her a prophylactic antibiotic today, Keflex, just to be on the safe side. Nonetheless again there is no obvious signs of active infection at this time. 03/13/2019 on evaluation today patient appears to be doing well with regard to her bilateral lower extremities where she has been weeping compared to even last week's evaluation. I see some areas of new skin growth which is excellent and overall I am very pleased with how things  seem to be progressing. No fevers, chills, nausea, vomiting, or diarrhea. 03/20/2019 on evaluation today patient unfortunately is continuing to have issues with significant edema of the left lower extremity. Her right side seems to be doing much better. Unfortunately her left side is showing increased weeping of the lower portion of her leg. This is quite unfortunate obviously we were hoping to get her into the lymphedema clinic they really do not seem  to when I see her how if she is draining. Despite the fact this is really not wound related but more lymphedema weeping related. Nonetheless I do not know that this can be helpful for her to even go for that appointment since again I am not sure there is much that they would actually do at this point. We may need to try a 4 layer compression wrap as best we can on her leg. She is on the Augmentin currently although I am still concerned about whether or not there could be potentially something going on infection wise I would obtain a culture though I understand is not the best being that is a surface culture I just 1 to make sure I do not seem to be missing anything. 03/27/2019 on evaluation today patient appears to be doing much better in regard to the left lower extremity compared to last week. Last week she had tremendous weeping which I think was subsequent to infection now she seems to be doing much better and very pleased. This is not completely healed but there is a lot of new skin growth and it has dried out quite a bit. Overall I think that we are doing well with how things are moving along at this time. No fevers, chills, nausea, vomiting, or diarrhea. 04/03/2019 evaluation today patient appears to be doing a little worse this week compared to last time I saw her. I think this may be due to the fact that she is having issues with not being able to sleep in her bed at least not until last night. She is therefore been in a lift chair and subsequently has also had issues with not been able to use her pumps since she could not get in bed. With that being said the patient overall seems to be doing okay I do think I may want extend the antibiotic for a little bit longer at least until we can see if her edema and her weeping gets better and if it is then obviously I can always discontinue the antibiotics as of next week however I want her to continue to have it over the next week. 04/10/2019 on  evaluation today patient unfortunately is still doing poorly with regard to her left lower extremity. Her right is all things considering doing fairly well. On the left however she continues to have spreading of the area of infection and weeping which appears to be even a larger surface area than noted last week. She did have a positive culture for Pseudomonas in particular which seems to have been of concern she still has green/yellow discharge consistent with Pseudomonas and subsequently a tremendous amount of it. This has me obviously still concerned about the infection not really clearing up despite the fact that on culture it appears the Cipro should have been a good option for treating this. I think she may at this point need IV antibiotics since things are not doing better I do not want to get worse and cause sepsis. She is in agreement with the plan and  believes as well that she likely does need to go to the hospital for IV vancomycin. Or something of the like depending on what the recommendation is from the ER. 04/17/2019 on evaluation today patient appears to be doing excellent in regard to her lower extremity on the left. She was in the hospital for several days from when I sent her last we saw her until just this past Tuesday. Fortunately her drainage is significantly improved and in fact is mostly clear. There is just a couple small areas that may still drain a little bit she states that the [facility] they prescribed for her at discharge she went picked up from pharmacy and got home but has not been able to find it since. She is looked everywhere. She is wondering if I will replace that for her today I will be more than happy to do that. 05/01/2019 on evaluation today patient actually appears to be doing quite well with regard to her lower extremities. She occasionally is having areas that will leak and then heal up mainly when a piece of the fibrotic skin pops off but fortunately she is not  having any signs of active infection at this time. Overall she also really does not have any obvious weeping at this time. I do believe however she really needs some compression wraps and I think this may be a good time to get her back to the lymphedema clinic. 05/11/2019 on evaluation today patient actually appears to be doing quite well with regard to her bilateral lower extremities. She occasionally will have a small area that we per another but in general seems to be completely healed which is great news. Overall very pleased with how everything seems to be progressing. She does have her appointment with lymphedema clinic on November 18. 05/25/2019 on evaluation today patient appears to be doing well with regard to her left lower extremity. I am very pleased in this regard. In regard to her right leg this actually did start draining more I think it is mainly due to the fact that her leg is more swollen. I am not seeing any obvious signs of infection at this time although that is definitely something were obviously acutely aware of simply due to the fact that she had an issue not too far back with exactly this issue. Nonetheless I do feel like that lymphedema clinic would still be beneficial for her. I explained obviously if they are not able to do anything treatment wise on the right leg we could at least have them treat her left leg and then proceed from there. The patient is really in agreement with that plan. If they are able to do both as the drainage slows down that I would be happy to let them handle both. ELLASON, DOUCETT ([id number]) [id number] Page 3 of 15 06/01/2019 on evaluation today patient unfortunately appears to be doing worse with regard to her right lower extremity. The left lower extremity is still maintaining at this point. Unfortunately she has been having significantly increased pain over the past several days and has been experiencing as  well increased swelling of the right lower extremity. I really do not know that I am seeing anything that appears to be obvious for infection at this point to be peripherally honest. With that being said the patient does seem to be having much more swelling that she is even experienced in the past and coupled with increased pain in her hip as well I am concerned that  again she could potentially have a DVT although I am not 100% sure of this. I think it something that may need to be checked out. We discussed the possibility of sending her for a DVT study through the hospital but unfortunately transportation is an issue if she does have a DVT I do not want her to wait days to be able to get in for that test however if she has this scheduled as an outpatient that is as fast that she will be able to get the test scheduled for transportation purposes. That will also fall on Thanksgiving so subsequently she did actually be looking at either Friday or even next week before we would know anything back from this. That is much too long in my opinion. Subsequent to the amount of discomfort she is experiencing the patient is actually okay with going to the ER for evaluation today. 06/12/2019 on evaluation today patient actually appears to be doing significantly better compared to last time I saw her. Following when I last saw her she was actually in the hospital from that Monday until the following Sunday almost 1 full week. She actually was placed on Keflex in the hospital following the time for her to be discharged and Dr. Joylene Draft has recommended 2 times a day dosing of the Keflex for the next year in order to help with more prophylactic/preventative measures with regard to her developing cellulitis. Overall I think this sounds like an excellent plan. The patient unfortunately is good to have trouble being treated at lymphedema clinic due to the fact that she really cannot get up on the bed that they have  there. They also state that they cannot manage her as long as she has anything draining at this point. Obviously that is somewhat unfortunate as she does need help with edema control but nonetheless we will have to do what we can for her outside of it sounds like the lymphedema clinic scenario at this point. 06/19/2019 on evaluation today patient appears to be doing fairly well with regard to her bilateral lower extremities. She is not nearly as swollen and shows no signs of infection at this point. There is no evidence of cellulitis whatsoever. She also has no open wounds or draining at this point which is also good news. No fever chills noted. She seems to be in very good spirits and in fact appears to be doing quite well. READMISSION 11/27/2019 This is a 67 year old woman that we have had in this clinic several times before including 2015, 16 and 19 and then most recently from 03/20/2019 through 06/19/2019 with bilateral lower extremity lymphedema. She has had previous arterial and reflux studies done years ago which were not all that remarkable. 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 She does have a positive family history and she had large legs starting may be in her 82s. She was recently in hospital from 10/20/2019 through 10/28/2019 with right leg cellulitis. She was given Ancef and clindamycin and then Zosyn when a culture showed Pseudomonas. At that time there was purulent drainage. She was followed by infectious disease Dr Joylene Draft. The patient is now back at home. She has noted increased swelling in the right and no drainage in her right leg mostly on the posterior medial aspect in the calf area. She has not had pain or fever. She has literally been improved lysing above dressings because her at the area of this is far too large  for standard compression. She has been wrapping the areas with sheets to resorptive pads. She is found these  helped somewhat. She does have an appointment with the lymphedema clinic in [place] in late June. Past medical history includes bilateral lymphedema, hypertension, obstructive sleep apnea with CPAP. Recent hospitalization with apparently Pseudomonas cellulitis of the right lower leg 12/15/2019 upon evaluation today patient appears to be doing a little bit worse in regard to her right lower extremity. Unfortunately she is having more weeping down in the lower portion of her leg. Fortunately there is no signs of active infection at this time. No fever chills noted. The patient states she is not having increased pain except for when she attempted to use the lymphedema pumps unfortunately she states that she did have pain when she did this. Otherwise we been using absorptive dressings of one type or another she is using diapers at home and then subsequently Ace wraps. In regard to the barrier cream we have discussed the possibility of derma cloud which she would like to try I do not have a problem with that. 12/22/2019 upon evaluation today patient actually appears to be doing better in regard to her leg ulcers at this point. Fortunately there does not appear to be any signs of active infection which is great news and I am extremely pleased with where things are progressing at this time. There is no sign of active infection currently. The patient is very pleased to see things doing so well. 12/29/2019 upon evaluation today patient appears to be doing a little bit better in regard to her weeping in general over her lower extremities. She does have some signs of mild erythema little bit more than what I noted last week or rather last visit. Nonetheless I think that my threshold for switching her antibiotics from Keflex to something else is very low at this point considering that she has had such severe infections in the past that seem to come almost out of nowhere. There is a little erythema and warmth  noted of the lower portion of her leg compared to the upper which also makes me want to go ahead and address things more rapidly at this point. Likely I would switch out the Keflex for something like Levaquin ideally. 7/16; patient with severe bilateral lymphedema. She has superficial wounds albeit almost circumferential now on the left lateral lower leg. This may be new from last time. Small area on the right anterior lower leg and then another area on the right medial lower leg and of pannus fold. She has been using various absorptive garments. She states she is using her compression pumps once a day occasionally twice. Culture from her last visit here was negative 01/29/2020 on evaluation today patient appears to be doing excellent at this point in regard to her legs with regard to infection I see no signs of active infection at this point. She still does have unfortunately areas of weeping this is minimal on the right now her left is actually significantly worse although I do not think it is as bad as last week with Dr. Leanord Hawking saw her. She has been trying to pump and elevate her legs is much as possible. She has previously been on the Keflex and in the past for prevention that seems to do fairly well and likely can extend that today. 02/04/2020 on evaluation today patient appears to be doing better in regard to her legs bilaterally. Fortunately there is no signs of active infection  at this time which is great news and overall she has less weeping on the left compared to the right and there is several spots where she is pretty much sealed up with no draining regions. Overall very pleased in this regard. 02/19/2020 on evaluation today patient appears to be doing very well in regard to her wounds currently. Fortunately there is no evidence of active infection overall very pleased with where things stand. She is significantly improved in regard to her edema I am extremely pleased in this regard she tells  me that the popping no longer hurts and in fact she actually looks forward to it. 03/04/2020 on evaluation today patient appears to be doing excellent in regard to her lower extremities. Fortunately there is no signs of active infection at this time. No fevers, chills, nausea, vomiting, or diarrhea. 03/25/2020 on evaluation today patient appears to be doing a little bit more poorly in regard to her legs at this point. She tells me that she is still continue to have issues with drainage and this has been a little bit worse she was getting ready to start taking the Keflex again but wanted to see me first. Fortunately there is no signs of active infection at this time. No fevers, chills, nausea, vomiting, or diarrhea. 04/15/2020 upon evaluation today patient appears to be doing somewhat poorly in regard to her right leg. She tells me she has been having more pain she has been taking the Keflex that was previously prescribed unfortunately that just does not seem to help with this. She was hoping that the pain on her right was actually coming from the fact that she was having issues with her wrap having gotten caught in her recliner. With that being said she tells me that she knew something was not right. Currently her right leg is warm to touch along with being erythematous all the way up to around at least mid thigh as far as I can see. The left leg does not appear to be doing that badly though there is increased weeping around the ankle region. 05/06/2020 on evaluation today patient appears to be doing much better than last time I saw her. She did go to the hospital where she was admitted for 2 days and treated with antibiotic therapy. She was discharged with antibiotics as well and has done extremely well. I am extremely pleased with where things stand today. There is no signs of active infection at this time which is great news. 05/27/2020 upon evaluation today patient appears to be doing well with  regard to her lower extremities bilaterally. She has just a very tiny area on the right leg which is opening on the left leg she is significantly improved though she still has several areas that do appear to be open this is minimal compared to what is been in the past. In general I am extremely pleased with where things stand today. The patient does tell me she is not been using her pumps quite as much as DEBBORAH, GHEZZI ([id number]) [id number] Page 4 of 15 she should be. I do believe that is 1 area she can definitely work on. She has had a lot going on including a Covid exposure and apparently also a outbreak of likely shingles. 06/24/2020 upon evaluation today patient appears to be doing well with regard to her legs in general although the left leg unfortunately is showing some signs of erythema she does have a little bit of increased weeping and to be  honest I am concerned about infection here. I discussed that with her today and I think that we may need to address this sooner rather than later she has been taking Keflex she is not really certain that is been making a big improvement however. No fevers, chills, nausea, vomiting, or diarrhea. 06/30/2020 upon evaluation today patient's legs actually seem to be doing better in my opinion as compared to where they were last week. Fortunately there does not appear to be any signs of active infection. Her culture showed multiple organisms nothing predominate. With that being said the Levaquin seems to have done well I think she has improved since I last saw her as well. 07/14/2020 upon evaluation today patient appears to be doing actually better in regard to her lower extremities in my opinion. She has been tolerating the dressing changes without complication. Fortunately there is no signs of active infection at this time. 08/04/2020 upon evaluation today patient appears to be doing excellent in regard to her leg ulcers.  Fortunately she has very little that is open at this point. This is great news. In fact I think that the Goldbond medicated powder has been excellent for her. It seems to have done the trick where we had tried several other things without as much success. Fortunately there is no evidence of active infection at this time. No fevers, chills, nausea, vomiting, or diarrhea. 09/01/2020 upon evaluation today patient appears to be doing a little bit more poorly than the last time I saw her. She tells me right now that she has been having a lot of drainage compared to where things were previous which has unfortunately led to more irritation as well. She is concerned that this is leading to infection. Again based on what I am seeing today as well I am also concerned of the same to be honest. 09/15/2020 upon evaluation today patient appears to be doing well with regard to her legs compared to what I saw previous. Fortunately there does not appear to be any evidence of active infection at this time which is great news. At least not as badly as what it was previous. With that being said I do believe that the patient does need to have an extension of the antibiotics. This I believe will actually help her more in the way of making sure this stays under control and does not worsen. That was the Bactrim DS. Readmission: 08/01/2021 upon evaluation today patient appears to be doing actually pretty well all things considered. Is actually been almost a year since have seen her last March. Fortunately I do not see any evidence of active infection locally nor systemically at this point. She does have a couple open areas on the left leg the right leg appears to be doing quite well. In general she has been in the hospital 3 times since I saw her a year ago all 4 cellulitis type issues except for the last 1 which was actually more related to congestive heart failure for that reason she is not using her lymphedema pumps at this  point and that is probably the best idea. 08/08/2021 upon evaluation today patient appears to be doing well with regard to her legs all things considered her left leg may be a little bit more swollen based on what I see today. Fortunately I do not see any signs of active infection locally nor systemically at this time which is great news. No fevers, chills, nausea, vomiting, or diarrhea. 08/15/2021 upon evaluation today  patient actually appears to be doing excellent in regard to her wounds. She has been tolerating the dressing changes without complication. Fortunately I see no evidence of infection currently which is great news. No fevers, chills, nausea, vomiting, or diarrhea. 08/29/2021 upon evaluation patient appears to be doing excellent she in fact is almost completely healed. I am actually very pleased with where we stand today and I think that she is making wonderful progress she just has a small area of weeping on the right lateral leg everything else is pretty much completely healed which is also. 09/05/2021 upon evaluation today patient appears to be doing well with regard to her legs. She does have a small area of weeping on the right leg and there is a small area on the left leg as well. It is potentially something that may need to be addressed in the future more specifically but right now I think that there may have just been a little drainage here on the left. With all that being said I think that this still does not appear to be infected and overall I feel like she is doing quite well. That something we always have to keep a very close eye on as she can change very rapidly from okay to not okay. 09/12/2021 upon evaluation today patient appears to be doing a little bit worse in regard to her leg and actually somewhat concerned about the possibility of infection here. I discussed that with the patient. For that reason I am going to go ahead and see about getting her set up for a repeat  prescription for the Bactrim. She is in agreement with that plan. 3/14; patient presents for follow-up. She has been using silver alginate with dressing changes. She is still taking Bactrim prescribed at last clinic visit. She has no issues or complaints today. She has lymphedema pumps however does not use these. 09/26/2020 upon evaluation today patient appears to actually be doing well in regard to her right leg I am pleased in that regard. Unfortunately she has new areas on her left leg that are open at this point that I do need to be addressed. I am also concerned about the warmth around one of the wounds on the more anterior side. I think this is something that we may need to culture today potentially requiring antibiotics going forward. 10/03/2021 upon evaluation today patient unfortunately is not doing nearly as well as she was even last week with her wounds. She is having some issues here with increased cellulitis of the left lower extremity unfortunately. I did prescribe Augmentin for her eczema prescribed last week unfortunately she never actually received this prescription. The pharmacy stated they never got it. We double checked with them today they still did not receive it. I am not sure what happened in that regard. With that being said the patient is in good spirits today she does not appear to be as sick as where she normally is when the cellulitis gets significantly worse as it has in the past. Nonetheless the leg is much more red not just around the ankle where I was seeing at last week on Tuesday but rather now this is going all the way up to almost her knee and is definitely hot to touch compared to the right leg. Nonetheless I feel like she does need to go to the ER for likely IV antibiotics. 10-10-2021 upon evaluation today patient appears to be doing well with regard to her wound. She has been  tolerating the dressing changes that appears to be doing much better. After I saw her last  week she did go to the ER they admitted her to the hospital and gave her IV antibiotics she fortunately is doing significantly better. Overall I am extremely pleased with where things stand today. 10-17-2021 upon evaluation today patient appears to be doing well with regard to her wound. In fact this is looking better and her leg is looking better but she still does have some areas here of erythema. We will continue to address this as soon as possible in my opinion. I do believe she may require some debridement and what appears to be a more defined wound on her left leg at this point. 10-24-2021 upon evaluation patient is definitely showing signs of improvement which is great news. I do not see any evidence of active infection locally or systemically which is great news as well. No fevers, chills, nausea, vomiting, or diarrhea. 10-31-2021. Upon evaluation today patient's leg actually feels better from an infection and wound care perspective. I am actually very pleased in this regard. Unfortunately the biggest issue that I see at this time is that the patient is having a significant issue with her breathing. I did put her on the pulse ox machine to check her oxygen saturation and it was pretty much at 100% which is good but she tells me that when she is walking this is much more difficult for her. She also tells me that when she is trying to bend over to perform her dressing changes she is so short of breath due to the swelling and fluid in her abdominal area that she is just not been able to do this. She is tearful and very concerned today to be honest. I am truly sorry to see her this way I know she is really having a hard time at the moment. 11-14-2021 upon evaluation patient actually appears to be doing significantly better compared to when I last saw her. She was actually admitted to the hospital I believe this for 6 days total after I sent her on 10-31-2021. Subsequently they actually pulled off greater  than 50 pounds of fluid she tells me she feels significantly better her legs are not as tight she actually has some play in the tissue and it is obvious that she is doing much better just from a mobility JACQUALYN, LAUDICINA ([id number]) [id number] Page 5 of 15 standpoint as well as a breathing standpoint. Overall I am extremely happy for her and how she is doing the leg also looks much better on the left. 11-21-2021 upon evaluation today patient appears to be doing well although it does appear that she is going require some sharp debridement the wound is very dry this is the opposite of what its been she was having so much weeping that it was staying extremely wet. Nonetheless we do need to see about going ahead and debriding the wound today. 11-28-2021 upon evaluation today patient appears to be doing well with regard to her wound I definitely see signs of things continuing to improve which is great news. I do not see any evidence of active infection locally or systemically also great news. 12-05-2021 upon evaluation today patient appears to be doing well with regard to her wound. She has been tolerating the dressing changes. Fortunately there does not appear to be any signs of active infection locally or systemically at this time. No fevers, chills, nausea, vomiting, or diarrhea. 12-12-2021 upon evaluation  patient appears to be doing well with regard to her wound this is actually measuring smaller and looking much better. Fortunately I do not see any signs of active infection locally or systemically at this time which is excellent news. 6/13; small wound area in the folds of her lymphedema just above the left ankle. Fortunately the area looks quite good. She has been using silver alginate ABDs and an Ace wrap which she is able to change her self. She is not using her compression pumps out of fear that this could contribute to heart failure. 12-26-2021 upon evaluation patient's  wounds are actually showing signs of excellent improvement. I am very pleased with where things stand. Overall I think that she has been making excellent progress and I think we are very close to complete resolution which is great news. 01-11-2022 upon evaluation today patient appears to be doing well currently in regard to her legs in general although on the left leg she does have 1 area that still irritated and inflamed on the anterior portion of her leg. Fortunately I do not see any signs of systemic infection locally there might be some infection going on here however which is my biggest concern. Fortunately I do not see any evidence though of this spreading to any other location. 01-16-2022 upon evaluation today patient has actually showing signs of excellent improvement. Fortunately I do not see any evidence of infection locally or systemically which is great news and overall I am very pleased with where we stand at this point. 01-25-2022 upon evaluation today patient actually appears to be doing decently well in regard to her legs. She has been tolerating the dressing changes without complication. Fortunately there does not appear to be any evidence of active infection locally or systemically which is great news and overall I am extremely pleased with where we stand at this point. 02-13-2022 upon evaluation today patient appears to be doing well currently in regard to her wound which is actually showing signs of significant improvement. Fortunately I do not see any evidence of active infection locally or systemically at this time. No fevers, chills, nausea, vomiting, or diarrhea. 02-20-2022 upon evaluation today patient appears to be doing well currently in regard to her wound. She has been tolerating the dressing changes without complication. Fortunately there does not appear to be any signs of active infection locally or systemically at this time. No fevers, chills, nausea, vomiting,  or diarrhea. 02-27-2022 upon evaluation today patient appears to be doing excellent in regard to her wounds on the leg. She has been tolerating the dressing changes without complication this is very close to complete resolution. Fortunately I do not see any signs of infection locally or systemically at this time which is great news. No fevers, chills, nausea, vomiting, or diarrhea. 03-06-2022 upon evaluation today patient appears to be doing well currently in regard to her wound in general. She has been tolerating the dressing changes without complication. Fortunately I do not see any evidence of active infection locally or systemically at this time which is great news. I am concerned a little bit however about not really her wounds but the second toe right foot where there appears to be some cellulitis after she dropped a frying pan on this about 2 weeks ago. She tells me its been itching and burning and I do think that it is possible she may have an infection based on what we are seeing currently. 03-20-2022 upon evaluation today patient actually appears to be  doing much better at this point. Fortunately there does not appear to be any signs of active infection locally or systemically at this time which is great news. No fevers, chills, nausea, vomiting, or diarrhea. 03-27-2022 upon evaluation today patient appears to be doing somewhat poorly in regard to her legs compared to previous. Her right leg has a couple areas that are open and she is very warm and painful to touch around the lateral portion of the ankle area. Left leg is showing signs of little bit more drainage and weeping as well I think that this is probably due to the fact that she is swelling a lot more that she has been in the past. Fortunately I do not see any signs of active infection systemically though locally there is definitely some issues here. 04-03-2022 upon evaluation today patient appears to be doing poorly in regard to her  wounds on the legs unfortunately. She also seems to have more significant cellulitis that has ensued. Unfortunately she has been on doxycycline initially it seemed to be helping but as of this week that is no longer the case. It started getting worse last week and with the reinitiation of the doxycycline nothing has improved. Often when she gets like this IV antibiotics are necessary. 04-19-2022 upon evaluation today patient appears to be doing well currently in regard to her legs from an infection standpoint. She did get IV vancomycin in the hospital and states that her legs did get better when she left she was no longer weeping but she had been in the bed for 4 days straight. With that being said since coming home her legs have begun to weep again the original wound that we were dealing with has healed she does have some weeping over both lower extremities however which is still of concern. 05-03-2022 upon evaluation today patient appears to be doing well currently in regard to her left leg which I feel like is a little bit better unfortunately the right leg is still draining significantly. Fortunately I do not see any signs of infection locally or systemically at this time. No fevers, chills, nausea, vomiting, or diarrhea. 05-10-2022 unfortunately patient actually showed signs of increasing erythema today. I believe that the cellulitis never completely resolved from when she was in the hospital last. Again she has been on doxycycline previously by myself I gave her Keflex more recently as an ongoing prescription to try to prevent infection but again I do not think this is strong enough to be able to get this under control. I really feel like that she is probably can need IV antibiotics and a good period of time in the hospital to be able to keep her legs elevated and not having to get around to do a whole lot in order to allow this to resolve. The patient voiced understanding. 05-24-2022 upon  evaluation today patient appears to be doing excellent in regard to her legs. She has been tolerating the dressing changes without complication. Fortunately there does not appear to be any signs of infection at this time which is great news she has been on cefadroxil since she was discharged from the hospital. The day that we sent her she actually tells me that she ended up in septic shock that day and coded. When she first showed up they did not notice it on the lab work but said by the end of the day she had already undergone this and they had had to address the issue obviously when  she coded. She tells me that likely she would not be here today if she had not gone to the hospital that day. I explained to her that I do believe that was a God thing and it was just the perfect timing that he had for her at that time. 06-14-2022 upon evaluation today patient appears to be doing well currently in regard to her legs. In general I think she is making good progress and I do not see any signs of infection at this time which is great news. No fevers, chills, nausea, vomiting, or diarrhea. 06-28-2022 upon evaluation today patient appears to be doing excellent in regard to her legs. She is actually showing signs of excellent improvement I do not see any evidence of infection locally nor systemically at this time. No fevers, chills, nausea, vomiting, or diarrhea. 07-26-2022 upon eval evaluation today patient appears to be doing well currently in regard to her wound. She has been tolerating the dressing changes without complication. Fortunately I do not see any major issues here she has a couple areas of weeping mainly on the right leg the left leg seems to be doing pretty RAIGHAN, WUNDERLICH ([id number]) [id number] Page 6 of 15 well. 08-23-2021 upon evaluation today patient appears to be doing excellent in regard to her legs. These are showing signs of wonderful improvement I am  actually very pleased with where we stand I think she is doing the best that I have ever seen her. Fortunately I do not see any evidence of infection locally or systemically which is great news. 09-27-2022 upon evaluation today patient appears to be doing well she has no open wounds at this time her lymphedema seems to be doing quite well at this point. Fortunately I do not see any signs of active infection locally nor systemically which is great news and overall her swelling though up a little bit is not nearly as bad as it has been at times. No evidence of cellulitis currently. 10-25-2022 upon evaluation patient appears to be doing excellent in regard to her wounds currently. I do not see anything open that this is great her leg still has the bilateral lymphedema but again this is not nearly as severe as what it has been in the past I think she is doing really well. She is using the pumps when she can although she does tell me there is been some issue there with her arthritis at times not being able to do what she needs to do. 11-27-2022 upon evaluation today patient appears to be doing well currently in regard to her legs which do not show any signs of opening she still has significant lymphedema bilaterally. Fortunately I do not see any evidence of active infection locally nor systemically which is great news. 12-25-2022 upon evaluation today patient appears to be doing excellent in fact she is showing signs of her legs still doing great with no openings and overall very pleased in that regard. She has been using her lymphedema wraps that she purchased and I do seem to be helping her she tells me. Extremely pleased with this. 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 She has severe bilateral lymphedema with marked skin changes related to this. She has compression pumps at home but tells me she does not use them lately because of hip and lower back issues  difficulty getting into bed etc. She was also supposed to be Ace wrapping her lower leg she does not  do this on the right leg she has a special type of wrap that she uses from her mid calf up to her knee. She did not have any obvious open wounds but are intake nurse did notice the weeping lower area at on the right just above her ankle 04-16-2023 upon evaluation today patient appears to be doing well currently she does not have any open wounds at this point which is great news and in general I do believe that we are making really good headway towards closure. Fortunately she is doing great today. Unfortunately she did have a death in the family her older sister and she tells me that her other sister is on hospice. 05-21-2023 upon evaluation patient appears to be doing excellent since I last saw her there is nothing that appears to be open at all at this point. Fortunately there also does not appear to be any signs of active infection at this time which is great news and overall I am very pleased with where we stand currently. 06-18-2023 upon evaluation today patient appears to be doing excellent in regard to her lymphedema. She has been tolerating the dressing changes without complication and in general I feel like that we are making really good headway here towards closure which is great news. Electronic Signature(s) Signed: 06/19/2023 7:34:59 PM By: Allen Derry PA-C Entered By: Allen Derry on 06/19/2023 19:34:59 -------------------------------------------------------------------------------- Physical Exam Details Patient Name: Date of Service: ARHA, KOWALCZYK 06/18/2023 10:45 A M Medical Record Number: [id number] Patient Account Number: [id number] Date of Birth/Sex: Treating RN: 02/24/1956 (67 y.o. [name] Primary Care Provider: [name] Crutch Other Clinician: Betha Loa Referring Provider: Treating Provider/Extender: Wynona Dove in Treatment:  32 Constitutional Obese and well-hydrated in no acute distress. Respiratory normal breathing without difficulty. Psychiatric this patient is able to make decisions and demonstrates good insight into disease process. Alert and Oriented x 3. pleasant and cooperative. Notes Upon inspection patient's wound bed actually showed signs of good granulation and epithelization at this point. Fortunately I think that she does not have any wounds open and her lymphedema seems to be doing quite well she is not nearly as tense and tight there she has been there is no signs of cellulitis. Electronic Signature(s) Signed: 06/19/2023 7:35:16 PM By: Ginger Organ, [place] ([id number]) [id number] Page 7 of 15 Entered By: Allen Derry on 06/19/2023 19:35:16 -------------------------------------------------------------------------------- Physician Orders Details Patient Name: Date of Service: RAFFAELA, MCFIELD 06/18/2023 10:45 A M Medical Record Number: [id number] Patient Account Number: [id number] Date of Birth/Sex: Treating RN: 1955-08-04 (67 y.o. Esmeralda Links Primary Care Provider: [name] Crutch Other Clinician: Betha Loa Referring Provider: Treating Provider/Extender: Wynona Dove in Treatment: 98 The following information was scribed by: Betha Loa The information was scribed for: Allen Derry Verbal / Phone Orders: No Diagnosis Coding Follow-up Appointments Return Appointment in 1 month Nurse Visit as needed Bathing/ Shower/ Hygiene Clean wound with Normal Saline or wound cleanser. Wash wounds with antibacterial soap and water. May shower; gently cleanse wound with antibacterial soap, rinse and pat dry prior to dressing wounds No tub bath. Non-Wound Condition Bilateral Lower Extremities Cleanse affected area with antibacterial soap and water, Medications-Please add to medication list. ntibiotic - apply Mupirocin to  weepy areas as needed Topical A Electronic Signature(s) Signed: 06/18/2023 4:48:05 PM By: Angelina Pih Signed: 06/19/2023 7:54:12 PM By: Allen Derry PA-C Entered By: Angelina Pih on 06/18/2023 11:52:04 -------------------------------------------------------------------------------- Problem List Details Patient Name:  Date of Service: LAVINIA, KASAL 06/18/2023 10:45 A M Medical Record Number: [id number] Patient Account Number: [id number] Date of Birth/Sex: Treating RN: [birth year] (67 y.o. [name] Primary Care Provider: [name] Crutch Other Clinician: Betha Loa Referring Provider: Treating Provider/Extender: Wynona Dove in Treatment: [address] 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 [name] Primary Care Provider: [name] Crutch Other Clinician: Betha Loa Referring Provider: Treating Provider/Extender: Wynona Dove in Treatment: 30 Subjective Chief Complaint Information obtained from Patient Bilateral LE lymphedema with Left LE Ulcers History of Present Illness (HPI) The  patient is a 67 year old female with history of hypertension and a long-standing history of bilateral lower extremity lymphedema (first presented on 4/2) . She has had open ulcers in the past which have always responded to compression therapy. She had briefly been to a lymphedema clinic in the past which helped her at the time. this time around she stopped treatment of her lymphedema pumps approximately 2 weeks ago because of some pain in the knees and then noticed the right leg getting worse. She was seen by her PCP who put her on clindamycin 4 times a day 2 days ago. The patient has seen [facility] and Dr. Gilda Crease had seen her last year where a vascular study including venous and arterial duplex studies were within normal limits. he had recommended compression stockings and lymphedema pumps and the patient has been using this in about 2 weeks ago. She is known to be diabetic but in the past few time she's gone to her primary care doctor her hemoglobin A1c has been normal. 02/11/2015 - after her last visit she took my advice and went to the ER regarding the progressive cellulitis of her right lower extremity and she was admitted between July 17 and 22nd. She received IV antibiotics and then was sent home on a course of steroid-induced and oral antibiotics. She has improved much since then. 02/17/2015 -- she has been doing fine and the weeping of her legs has remarkably gone down. She has no fresh issues. READMISSION 01/15/18 This patient was given this clinic before most recently in 2016 seen by Dr. Meyer Russel. She has massive bilateral lymphedema and over the last 2 months this had weeping edema out of the left leg. She has compression pumps but her compliance with these has been minimal. She has [facility] they've been using TCA/ABDs/kerlix under an Ace wrap.she has had recent problems with cellulitis. She was apparently seen in the ER and 12/23/17 and given clindamycin. LEARLEAN, NAQUIN ([id number])  [id number] Page 9 of 15 She was then followed by her primary doctor and given doxycycline and Keflex. The pain seems to have settled down. In April 2018 the patient had arterial studies done at [facility]. This showed triphasic waveforms throughout the right leg and mostly triphasic waveforms  on the left except for monophasic at the posterior tibial artery distally. She was not felt to have evidence of right lower extremity arterial stenosis or significant problems on the left side. 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 She also had a right lower extremity venous Doppler in January 2018 this was limited by the patient's body habitus and lymphedema. Most of the proximal veins were not visualized The patient presents with an area of denuded skin on the anterior medial part of the left calf. There is weeping edema fluid here. 01/22/18; the patient has somewhat better edema control using her compression pumps twice a day and as a result she has much better epithelialization on the left anterior calf area. Only a small open area remains. 01/29/18; the patient has been compliant with her compression pumps. Both the areas on her calf that healed. The remaining area on the left anterior leg is fully epithelialized Readmission: 02/20/2019 upon evaluation today patient presents for reevaluation due to issues that she is having with the bilateral lower extremities. She actually has wounds open on both legs. On the right she has an area in the crease of her leg on the right around the knee region which is actually draining quite a bit and actually has some fungal type appearance to it. She has been on nystatin powder that seems to have helped to some degree. In regard to the left lower extremity this is actually in the lower portion of her leg closer to the ankle and again is continuing to drain as well unfortunately. There does not appear  to be any signs of active infection at this time which is good news. No fevers, chills, nausea, vomiting, or diarrhea. She tells me that since she was seen last year she is actually been doing quite well for the most part with regard to her lower extremities. Unfortunately she now is experiencing a little bit more drainage at this time. She is concerned about getting this under control so that it does not get significantly worse. 02/27/2019 on evaluation today patient appears to be doing somewhat better in regard to her bilateral lower extremity wounds. She has been tolerating the dressing changes without complication. Fortunately there is no signs of active infection at this point. No fevers, chills, nausea, vomiting, or diarrhea. She did get her dressing supplies which is excellent news she was extremely excited to get these. She also got paperwork from [employer] for their financial assistance program where they may be able to help her out in the future if needed with supplies at discounted prices. 03/06/2019 on evaluation today patient appears to be doing a little worse with regard to both areas of weeping on her bilateral lower extremities. This is around the right medial knee and just above the left ankle. With that being said she is unfortunately not doing as well as I would like to see. I feel like she may need to potentially go see someone at the lymphedema clinic as the wraps that she needs or even beyond what we can do here at the wound care center. She really does not have wounds she just has open areas of weeping that are causing some difficulty for her. Subsequently because of this and the moisture I am concerned about the potential for infection I am going to likely give her a prophylactic antibiotic today, Keflex, just to be on the safe side. Nonetheless again there is no obvious signs of active infection at this  time. 03/13/2019 on evaluation today patient appears to be doing well with regard  to her bilateral lower extremities where she has been weeping compared to even last week's evaluation. I see some areas of new skin growth which is excellent and overall I am very pleased with how things seem to be progressing. No fevers, chills, nausea, vomiting, or diarrhea. 03/20/2019 on evaluation today patient unfortunately is continuing to have issues with significant edema of the left lower extremity. Her right side seems to be doing much better. Unfortunately her left side is showing increased weeping of the lower portion of her leg. This is quite unfortunate obviously we were hoping to get her into the lymphedema clinic they really do not seem to when I see her how if she is draining. Despite the fact this is really not wound related but more lymphedema weeping related. Nonetheless I do not know that this can be helpful for her to even go for that appointment since again I am not sure there is much that they would actually do at this point. We may need to try a 4 layer compression wrap as best we can on her leg. She is on the Augmentin currently although I am still concerned about whether or not there could be potentially something going on infection wise I would obtain a culture though I understand is not the best being that is a surface culture I just 1 to make sure I do not seem to be missing anything. 03/27/2019 on evaluation today patient appears to be doing much better in regard to the left lower extremity compared to last week. Last week she had tremendous weeping which I think was subsequent to infection now she seems to be doing much better and very pleased. This is not completely healed but there is a lot of new skin growth and it has dried out quite a bit. Overall I think that we are doing well with how things are moving along at this time. No fevers, chills, nausea, vomiting, or diarrhea. 04/03/2019 evaluation today patient appears to be doing a little worse this week compared to last  time I saw her. I think this may be due to the fact that she is having issues with not being able to sleep in her bed at least not until last night. She is therefore been in a lift chair and subsequently has also had issues with not been able to use her pumps since she could not get in bed. With that being said the patient overall seems to be doing okay I do think I may want extend the antibiotic for a little bit longer at least until we can see if her edema and her weeping gets better and if it is then obviously I can always discontinue the antibiotics as of next week however I want her to continue to have it over the next week. 04/10/2019 on evaluation today patient unfortunately is still doing poorly with regard to her left lower extremity. Her right is all things considering doing fairly well. On the left however she continues to have spreading of the area of infection and weeping which appears to be even a larger surface area than noted last week. She did have a positive culture for Pseudomonas in particular which seems to have been of concern she still has green/yellow discharge consistent with Pseudomonas and subsequently a tremendous amount of it. This has me obviously still concerned about the infection not really clearing up despite the fact that  on culture it appears the Cipro should have been a good option for treating this. I think she may at this point need IV antibiotics since things are not doing better I do not want to get worse and cause sepsis. She is in agreement with the plan and believes as well that she likely does need to go to the hospital for IV vancomycin. Or something of the like depending on what the recommendation is from the ER. 04/17/2019 on evaluation today patient appears to be doing excellent in regard to her lower extremity on the left. She was in the hospital for several days from when I sent her last we saw her until just this past Tuesday. Fortunately her drainage is  significantly improved and in fact is mostly clear. There is just a couple small areas that may still drain a little bit she states that the [facility] they prescribed for her at discharge she went picked up from pharmacy and got home but has not been able to find it since. She is looked everywhere. She is wondering if I will replace that for her today I will be more than happy to do that. 05/01/2019 on evaluation today patient actually appears to be doing quite well with regard to her lower extremities. She occasionally is having areas that will leak and then heal up mainly when a piece of the fibrotic skin pops off but fortunately she is not having any signs of active infection at this time. Overall she also really does not have any obvious weeping at this time. I do believe however she really needs some compression wraps and I think this may be a good time to get her back to the lymphedema clinic. 05/11/2019 on evaluation today patient actually appears to be doing quite well with regard to her bilateral lower extremities. She occasionally will have a small area that we per another but in general seems to be completely healed which is great news. Overall very pleased with how everything seems to be progressing. She does have her appointment with lymphedema clinic on November 18. 05/25/2019 on evaluation today patient appears to be doing well with regard to her left lower extremity. I am very pleased in this regard. In regard to her right leg this actually did start draining more I think it is mainly due to the fact that her leg is more swollen. I am not seeing any obvious signs of infection at this time although that is definitely something were obviously acutely aware of simply due to the fact that she had an issue not too far back with exactly this issue. Nonetheless I do feel like that lymphedema clinic would still be beneficial for her. I explained obviously if they are not able to do anything  treatment wise on the right leg we could at least have them treat her left leg and then proceed from there. The patient is really in agreement with that plan. If they are able to do both as the drainage slows down that I would be happy to let them handle both. 06/01/2019 on evaluation today patient unfortunately appears to be doing worse with regard to her right lower extremity. The left lower extremity is still maintaining at this point. Unfortunately she has been having significantly increased pain over the past several days and has been experiencing as well increased swelling of the right lower extremity. I really do not know that I am seeing anything that appears to be obvious for infection at this point  to be peripherally honest. With that being said the patient does seem to be having much more swelling that she is even experienced in the past and coupled with increased pain in her hip as well I am concerned that again she could potentially have a DVT although I am not 100% sure of this. I think it something that JAZAYAH, ROETTGER ([id number]) [id number] Page 10 of 15 need to be checked out. We discussed the possibility of sending her for a DVT study through the hospital but unfortunately transportation is an issue if she does have a DVT I do not want her to wait days to be able to get in for that test however if she has this scheduled as an outpatient that is as fast that she will be able to get the test scheduled for transportation purposes. That will also fall on Thanksgiving so subsequently she did actually be looking at either Friday or even next week before we would know anything back from this. That is much too long in my opinion. Subsequent to the amount of discomfort she is experiencing the patient is actually okay with going to the ER for evaluation today. 06/12/2019 on evaluation today patient actually appears to be doing significantly better compared to  last time I saw her. Following when I last saw her she was actually in the hospital from that Monday until the following Sunday almost 1 full week. She actually was placed on Keflex in the hospital following the time for her to be discharged and Dr. Joylene Draft has recommended 2 times a day dosing of the Keflex for the next year in order to help with more prophylactic/preventative measures with regard to her developing cellulitis. Overall I think this sounds like an excellent plan. The patient unfortunately is good to have trouble being treated at lymphedema clinic due to the fact that she really cannot get up on the bed that they have there. They also state that they cannot manage her as long as she has anything draining at this point. Obviously that is somewhat unfortunate as she does need help with edema control but nonetheless we will have to do what we can for her outside of it sounds like the lymphedema clinic scenario at this point. 06/19/2019 on evaluation today patient appears to be doing fairly well with regard to her bilateral lower extremities. She is not nearly as swollen and shows no signs of infection at this point. There is no evidence of cellulitis whatsoever. She also has no open wounds or draining at this point which is also good news. No fever chills noted. She seems to be in very good spirits and in fact appears to be doing quite well. READMISSION 11/27/2019 This is a 67 year old woman that we have had in this clinic several times before including 2015, 16 and 19 and then most recently from 03/20/2019 through 06/19/2019 with bilateral lower extremity lymphedema. She has had previous arterial and reflux studies done years ago which were not all that remarkable. 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 She does have a positive family history and she had large legs starting may be in her 107s. She was recently in hospital from  10/20/2019 through 10/28/2019 with right leg cellulitis. She was given Ancef and clindamycin and then Zosyn when a culture showed Pseudomonas. At that time there was purulent drainage. She was followed by infectious disease Dr Joylene Draft. The patient is now back at home. She  has noted increased swelling in the right and no drainage in her right leg mostly on the posterior medial aspect in the calf area. She has not had pain or fever. She has literally been improved lysing above dressings because her at the area of this is far too large for standard compression. She has been wrapping the areas with sheets to resorptive pads. She is found these helped somewhat. She does have an appointment with the lymphedema clinic in [place] in late June. Past medical history includes bilateral lymphedema, hypertension, obstructive sleep apnea with CPAP. Recent hospitalization with apparently Pseudomonas cellulitis of the right lower leg 12/15/2019 upon evaluation today patient appears to be doing a little bit worse in regard to her right lower extremity. Unfortunately she is having more weeping down in the lower portion of her leg. Fortunately there is no signs of active infection at this time. No fever chills noted. The patient states she is not having increased pain except for when she attempted to use the lymphedema pumps unfortunately she states that she did have pain when she did this. Otherwise we been using absorptive dressings of one type or another she is using diapers at home and then subsequently Ace wraps. In regard to the barrier cream we have discussed the possibility of derma cloud which she would like to try I do not have a problem with that. 12/22/2019 upon evaluation today patient actually appears to be doing better in regard to her leg ulcers at this point. Fortunately there does not appear to be any signs of active infection which is great news and I am extremely pleased with where things are  progressing at this time. There is no sign of active infection currently. The patient is very pleased to see things doing so well. 12/29/2019 upon evaluation today patient appears to be doing a little bit better in regard to her weeping in general over her lower extremities. She does have some signs of mild erythema little bit more than what I noted last week or rather last visit. Nonetheless I think that my threshold for switching her antibiotics from Keflex to something else is very low at this point considering that she has had such severe infections in the past that seem to come almost out of nowhere. There is a little erythema and warmth noted of the lower portion of her leg compared to the upper which also makes me want to go ahead and address things more rapidly at this point. Likely I would switch out the Keflex for something like Levaquin ideally. 7/16; patient with severe bilateral lymphedema. She has superficial wounds albeit almost circumferential now on the left lateral lower leg. This may be new from last time. Small area on the right anterior lower leg and then another area on the right medial lower leg and of pannus fold. She has been using various absorptive garments. She states she is using her compression pumps once a day occasionally twice. Culture from her last visit here was negative 01/29/2020 on evaluation today patient appears to be doing excellent at this point in regard to her legs with regard to infection I see no signs of active infection at this point. She still does have unfortunately areas of weeping this is minimal on the right now her left is actually significantly worse although I do not think it is as bad as last week with Dr. Leanord Hawking saw her. She has been trying to pump and elevate her legs is much as possible.  She has previously been on the Keflex and in the past for prevention that seems to do fairly well and likely can extend that today. 02/04/2020 on evaluation  today patient appears to be doing better in regard to her legs bilaterally. Fortunately there is no signs of active infection at this time which is great news and overall she has less weeping on the left compared to the right and there is several spots where she is pretty much sealed up with no draining regions. Overall very pleased in this regard. 02/19/2020 on evaluation today patient appears to be doing very well in regard to her wounds currently. Fortunately there is no evidence of active infection overall very pleased with where things stand. She is significantly improved in regard to her edema I am extremely pleased in this regard she tells me that the popping no longer hurts and in fact she actually looks forward to it. 03/04/2020 on evaluation today patient appears to be doing excellent in regard to her lower extremities. Fortunately there is no signs of active infection at this time. No fevers, chills, nausea, vomiting, or diarrhea. 03/25/2020 on evaluation today patient appears to be doing a little bit more poorly in regard to her legs at this point. She tells me that she is still continue to have issues with drainage and this has been a little bit worse she was getting ready to start taking the Keflex again but wanted to see me first. Fortunately there is no signs of active infection at this time. No fevers, chills, nausea, vomiting, or diarrhea. 04/15/2020 upon evaluation today patient appears to be doing somewhat poorly in regard to her right leg. She tells me she has been having more pain she has been taking the Keflex that was previously prescribed unfortunately that just does not seem to help with this. She was hoping that the pain on her right was actually coming from the fact that she was having issues with her wrap having gotten caught in her recliner. With that being said she tells me that she knew something was not right. Currently her right leg is warm to touch along with being  erythematous all the way up to around at least mid thigh as far as I can see. The left leg does not appear to be doing that badly though there is increased weeping around the ankle region. 05/06/2020 on evaluation today patient appears to be doing much better than last time I saw her. She did go to the hospital where she was admitted for 2 days and treated with antibiotic therapy. She was discharged with antibiotics as well and has done extremely well. I am extremely pleased with where things stand today. There is no signs of active infection at this time which is great news. 05/27/2020 upon evaluation today patient appears to be doing well with regard to her lower extremities bilaterally. She has just a very tiny area on the right leg which is opening on the left leg she is significantly improved though she still has several areas that do appear to be open this is minimal compared to what is been in the past. In general I am extremely pleased with where things stand today. The patient does tell me she is not been using her pumps quite as much as she should be. I do believe that is 1 area she can definitely work on. She has had a lot going on including a Covid exposure and apparently also a outbreak of likely shingles.  06/24/2020 upon evaluation today patient appears to be doing well with regard to her legs in general although the left leg unfortunately is showing some signs of erythema she does have a little bit of increased weeping and to be honest I am concerned about infection here. I discussed that with her today and I think that LENEA, CHERY ([id number]) [id number] Page 11 of 15 we may need to address this sooner rather than later she has been taking Keflex she is not really certain that is been making a big improvement however. No fevers, chills, nausea, vomiting, or diarrhea. 06/30/2020 upon evaluation today patient's legs actually seem to be doing better in my  opinion as compared to where they were last week. Fortunately there does not appear to be any signs of active infection. Her culture showed multiple organisms nothing predominate. With that being said the Levaquin seems to have done well I think she has improved since I last saw her as well. 07/14/2020 upon evaluation today patient appears to be doing actually better in regard to her lower extremities in my opinion. She has been tolerating the dressing changes without complication. Fortunately there is no signs of active infection at this time. 08/04/2020 upon evaluation today patient appears to be doing excellent in regard to her leg ulcers. Fortunately she has very little that is open at this point. This is great news. In fact I think that the Goldbond medicated powder has been excellent for her. It seems to have done the trick where we had tried several other things without as much success. Fortunately there is no evidence of active infection at this time. No fevers, chills, nausea, vomiting, or diarrhea. 09/01/2020 upon evaluation today patient appears to be doing a little bit more poorly than the last time I saw her. She tells me right now that she has been having a lot of drainage compared to where things were previous which has unfortunately led to more irritation as well. She is concerned that this is leading to infection. Again based on what I am seeing today as well I am also concerned of the same to be honest. 09/15/2020 upon evaluation today patient appears to be doing well with regard to her legs compared to what I saw previous. Fortunately there does not appear to be any evidence of active infection at this time which is great news. At least not as badly as what it was previous. With that being said I do believe that the patient does need to have an extension of the antibiotics. This I believe will actually help her more in the way of making sure this stays under control and does not worsen.  That was the Bactrim DS. Readmission: 08/01/2021 upon evaluation today patient appears to be doing actually pretty well all things considered. Is actually been almost a year since have seen her last March. Fortunately I do not see any evidence of active infection locally nor systemically at this point. She does have a couple open areas on the left leg the right leg appears to be doing quite well. In general she has been in the hospital 3 times since I saw her a year ago all 4 cellulitis type issues except for the last 1 which was actually more related to congestive heart failure for that reason she is not using her lymphedema pumps at this point and that is probably the best idea. 08/08/2021 upon evaluation today patient appears to be doing well with regard to her legs all  things considered her left leg may be a little bit more swollen based on what I see today. Fortunately I do not see any signs of active infection locally nor systemically at this time which is great news. No fevers, chills, nausea, vomiting, or diarrhea. 08/15/2021 upon evaluation today patient actually appears to be doing excellent in regard to her wounds. She has been tolerating the dressing changes without complication. Fortunately I see no evidence of infection currently which is great news. No fevers, chills, nausea, vomiting, or diarrhea. 08/29/2021 upon evaluation patient appears to be doing excellent she in fact is almost completely healed. I am actually very pleased with where we stand today and I think that she is making wonderful progress she just has a small area of weeping on the right lateral leg everything else is pretty much completely healed which is also. 09/05/2021 upon evaluation today patient appears to be doing well with regard to her legs. She does have a small area of weeping on the right leg and there is a small area on the left leg as well. It is potentially something that may need to be addressed in the future  more specifically but right now I think that there may have just been a little drainage here on the left. With all that being said I think that this still does not appear to be infected and overall I feel like she is doing quite well. That something we always have to keep a very close eye on as she can change very rapidly from okay to not okay. 09/12/2021 upon evaluation today patient appears to be doing a little bit worse in regard to her leg and actually somewhat concerned about the possibility of infection here. I discussed that with the patient. For that reason I am going to go ahead and see about getting her set up for a repeat prescription for the Bactrim. She is in agreement with that plan. 3/14; patient presents for follow-up. She has been using silver alginate with dressing changes. She is still taking Bactrim prescribed at last clinic visit. She has no issues or complaints today. She has lymphedema pumps however does not use these. 09/26/2020 upon evaluation today patient appears to actually be doing well in regard to her right leg I am pleased in that regard. Unfortunately she has new areas on her left leg that are open at this point that I do need to be addressed. I am also concerned about the warmth around one of the wounds on the more anterior side. I think this is something that we may need to culture today potentially requiring antibiotics going forward. 10/03/2021 upon evaluation today patient unfortunately is not doing nearly as well as she was even last week with her wounds. She is having some issues here with increased cellulitis of the left lower extremity unfortunately. I did prescribe Augmentin for her eczema prescribed last week unfortunately she never actually received this prescription. The pharmacy stated they never got it. We double checked with them today they still did not receive it. I am not sure what happened in that regard. With that being said the patient is in good  spirits today she does not appear to be as sick as where she normally is when the cellulitis gets significantly worse as it has in the past. Nonetheless the leg is much more red not just around the ankle where I was seeing at last week on Tuesday but rather now this is going all the way  up to almost her knee and is definitely hot to touch compared to the right leg. Nonetheless I feel like she does need to go to the ER for likely IV antibiotics. 10-10-2021 upon evaluation today patient appears to be doing well with regard to her wound. She has been tolerating the dressing changes that appears to be doing much better. After I saw her last week she did go to the ER they admitted her to the hospital and gave her IV antibiotics she fortunately is doing significantly better. Overall I am extremely pleased with where things stand today. 10-17-2021 upon evaluation today patient appears to be doing well with regard to her wound. In fact this is looking better and her leg is looking better but she still does have some areas here of erythema. We will continue to address this as soon as possible in my opinion. I do believe she may require some debridement and what appears to be a more defined wound on her left leg at this point. 10-24-2021 upon evaluation patient is definitely showing signs of improvement which is great news. I do not see any evidence of active infection locally or systemically which is great news as well. No fevers, chills, nausea, vomiting, or diarrhea. 10-31-2021. Upon evaluation today patient's leg actually feels better from an infection and wound care perspective. I am actually very pleased in this regard. Unfortunately the biggest issue that I see at this time is that the patient is having a significant issue with her breathing. I did put her on the pulse ox machine to check her oxygen saturation and it was pretty much at 100% which is good but she tells me that when she is walking this is much  more difficult for her. She also tells me that when she is trying to bend over to perform her dressing changes she is so short of breath due to the swelling and fluid in her abdominal area that she is just not been able to do this. She is tearful and very concerned today to be honest. I am truly sorry to see her this way I know she is really having a hard time at the moment. 11-14-2021 upon evaluation patient actually appears to be doing significantly better compared to when I last saw her. She was actually admitted to the hospital I believe this for 6 days total after I sent her on 10-31-2021. Subsequently they actually pulled off greater than 50 pounds of fluid she tells me she feels significantly better her legs are not as tight she actually has some play in the tissue and it is obvious that she is doing much better just from a mobility standpoint as well as a breathing standpoint. Overall I am extremely happy for her and how she is doing the leg also looks much better on the left. 11-21-2021 upon evaluation today patient appears to be doing well although it does appear that she is going require some sharp debridement the wound is very dry this is the opposite of what its been she was having so much weeping that it was staying extremely wet. Nonetheless we do need to see about going ahead and debriding the wound today. CARLEEN, MENZIES ([id number]) [id number] Page 12 of 15 11-28-2021 upon evaluation today patient appears to be doing well with regard to her wound I definitely see signs of things continuing to improve which is great news. I do not see any evidence of active infection locally or systemically also great news. 12-05-2021  upon evaluation today patient appears to be doing well with regard to her wound. She has been tolerating the dressing changes. Fortunately there does not appear to be any signs of active infection locally or systemically at this time. No fevers,  chills, nausea, vomiting, or diarrhea. 12-12-2021 upon evaluation patient appears to be doing well with regard to her wound this is actually measuring smaller and looking much better. Fortunately I do not see any signs of active infection locally or systemically at this time which is excellent news. 6/13; small wound area in the folds of her lymphedema just above the left ankle. Fortunately the area looks quite good. She has been using silver alginate ABDs and an Ace wrap which she is able to change her self. She is not using her compression pumps out of fear that this could contribute to heart failure. 12-26-2021 upon evaluation patient's wounds are actually showing signs of excellent improvement. I am very pleased with where things stand. Overall I think that she has been making excellent progress and I think we are very close to complete resolution which is great news. 01-11-2022 upon evaluation today patient appears to be doing well currently in regard to her legs in general although on the left leg she does have 1 area that still irritated and inflamed on the anterior portion of her leg. Fortunately I do not see any signs of systemic infection locally there might be some infection going on here however which is my biggest concern. Fortunately I do not see any evidence though of this spreading to any other location. 01-16-2022 upon evaluation today patient has actually showing signs of excellent improvement. Fortunately I do not see any evidence of infection locally or systemically which is great news and overall I am very pleased with where we stand at this point. 01-25-2022 upon evaluation today patient actually appears to be doing decently well in regard to her legs. She has been tolerating the dressing changes without complication. Fortunately there does not appear to be any evidence of active infection locally or systemically which is great news and overall I am extremely pleased with where we stand  at this point. 02-13-2022 upon evaluation today patient appears to be doing well currently in regard to her wound which is actually showing signs of significant improvement. Fortunately I do not see any evidence of active infection locally or systemically at this time. No fevers, chills, nausea, vomiting, or diarrhea. 02-20-2022 upon evaluation today patient appears to be doing well currently in regard to her wound. She has been tolerating the dressing changes without complication. Fortunately there does not appear to be any signs of active infection locally or systemically at this time. No fevers, chills, nausea, vomiting, or diarrhea. 02-27-2022 upon evaluation today patient appears to be doing excellent in regard to her wounds on the leg. She has been tolerating the dressing changes without complication this is very close to complete resolution. Fortunately I do not see any signs of infection locally or systemically at this time which is great news. No fevers, chills, nausea, vomiting, or diarrhea. 03-06-2022 upon evaluation today patient appears to be doing well currently in regard to her wound in general. She has been tolerating the dressing changes without complication. Fortunately I do not see any evidence of active infection locally or systemically at this time which is great news. I am concerned a little bit however about not really her wounds but the second toe right foot where there appears to be some cellulitis  after she dropped a frying pan on this about 2 weeks ago. She tells me its been itching and burning and I do think that it is possible she may have an infection based on what we are seeing currently. 03-20-2022 upon evaluation today patient actually appears to be doing much better at this point. Fortunately there does not appear to be any signs of active infection locally or systemically at this time which is great news. No fevers, chills, nausea, vomiting, or diarrhea. 03-27-2022 upon  evaluation today patient appears to be doing somewhat poorly in regard to her legs compared to previous. Her right leg has a couple areas that are open and she is very warm and painful to touch around the lateral portion of the ankle area. Left leg is showing signs of little bit more drainage and weeping as well I think that this is probably due to the fact that she is swelling a lot more that she has been in the past. Fortunately I do not see any signs of active infection systemically though locally there is definitely some issues here. 04-03-2022 upon evaluation today patient appears to be doing poorly in regard to her wounds on the legs unfortunately. She also seems to have more significant cellulitis that has ensued. Unfortunately she has been on doxycycline initially it seemed to be helping but as of this week that is no longer the case. It started getting worse last week and with the reinitiation of the doxycycline nothing has improved. Often when she gets like this IV antibiotics are necessary. 04-19-2022 upon evaluation today patient appears to be doing well currently in regard to her legs from an infection standpoint. She did get IV vancomycin in the hospital and states that her legs did get better when she left she was no longer weeping but she had been in the bed for 4 days straight. With that being said since coming home her legs have begun to weep again the original wound that we were dealing with has healed she does have some weeping over both lower extremities however which is still of concern. 05-03-2022 upon evaluation today patient appears to be doing well currently in regard to her left leg which I feel like is a little bit better unfortunately the right leg is still draining significantly. Fortunately I do not see any signs of infection locally or systemically at this time. No fevers, chills, nausea, vomiting, or diarrhea. 05-10-2022 unfortunately patient actually showed signs of  increasing erythema today. I believe that the cellulitis never completely resolved from when she was in the hospital last. Again she has been on doxycycline previously by myself I gave her Keflex more recently as an ongoing prescription to try to prevent infection but again I do not think this is strong enough to be able to get this under control. I really feel like that she is probably can need IV antibiotics and a good period of time in the hospital to be able to keep her legs elevated and not having to get around to do a whole lot in order to allow this to resolve. The patient voiced understanding. 05-24-2022 upon evaluation today patient appears to be doing excellent in regard to her legs. She has been tolerating the dressing changes without complication. Fortunately there does not appear to be any signs of infection at this time which is great news she has been on cefadroxil since she was discharged from the hospital. The day that we sent her she actually tells  me that she ended up in septic shock that day and coded. When she first showed up they did not notice it on the lab work but said by the end of the day she had already undergone this and they had had to address the issue obviously when she coded. She tells me that likely she would not be here today if she had not gone to the hospital that day. I explained to her that I do believe that was a God thing and it was just the perfect timing that he had for her at that time. 06-14-2022 upon evaluation today patient appears to be doing well currently in regard to her legs. In general I think she is making good progress and I do not see any signs of infection at this time which is great news. No fevers, chills, nausea, vomiting, or diarrhea. 06-28-2022 upon evaluation today patient appears to be doing excellent in regard to her legs. She is actually showing signs of excellent improvement I do not see any evidence of infection locally nor systemically  at this time. No fevers, chills, nausea, vomiting, or diarrhea. 07-26-2022 upon eval evaluation today patient appears to be doing well currently in regard to her wound. She has been tolerating the dressing changes without complication. Fortunately I do not see any major issues here she has a couple areas of weeping mainly on the right leg the left leg seems to be doing pretty well. 08-23-2021 upon evaluation today patient appears to be doing excellent in regard to her legs. These are showing signs of wonderful improvement I am actually very pleased with where we stand I think she is doing the best that I have ever seen her. Fortunately I do not see any evidence of infection locally or systemically which is great news. DAVAE, NORGREN ([id number]) [id number] Page 13 of 15 09-27-2022 upon evaluation today patient appears to be doing well she has no open wounds at this time her lymphedema seems to be doing quite well at this point. Fortunately I do not see any signs of active infection locally nor systemically which is great news and overall her swelling though up a little bit is not nearly as bad as it has been at times. No evidence of cellulitis currently. 10-25-2022 upon evaluation patient appears to be doing excellent in regard to her wounds currently. I do not see anything open that this is great her leg still has the bilateral lymphedema but again this is not nearly as severe as what it has been in the past I think she is doing really well. She is using the pumps when she can although she does tell me there is been some issue there with her arthritis at times not being able to do what she needs to do. 11-27-2022 upon evaluation today patient appears to be doing well currently in regard to her legs which do not show any signs of opening she still has significant lymphedema bilaterally. Fortunately I do not see any evidence of active infection locally nor systemically which is  great news. 12-25-2022 upon evaluation today patient appears to be doing excellent in fact she is showing signs of her legs still doing great with no openings and overall very pleased in that regard. She has been using her lymphedema wraps that she purchased and I do seem to be helping her she tells me. Extremely pleased with this. 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 She has severe bilateral  lymphedema with marked skin changes related to this. She has compression pumps at home but tells me she does not use them lately because of hip and lower back issues difficulty getting into bed etc. She was also supposed to be Ace wrapping her lower leg she does not do this on the right leg she has a special type of wrap that she uses from her mid calf up to her knee. She did not have any obvious open wounds but are intake nurse did notice the weeping lower area at on the right just above her ankle 04-16-2023 upon evaluation today patient appears to be doing well currently she does not have any open wounds at this point which is great news and in general I do believe that we are making really good headway towards closure. Fortunately she is doing great today. Unfortunately she did have a death in the family her older sister and she tells me that her other sister is on hospice. 05-21-2023 upon evaluation patient appears to be doing excellent since I last saw her there is nothing that appears to be open at all at this point. Fortunately there also does not appear to be any signs of active infection at this time which is great news and overall I am very pleased with where we stand currently. 06-18-2023 upon evaluation today patient appears to be doing excellent in regard to her lymphedema. She has been tolerating the dressing changes without complication and in general I feel like that we are making really good headway here towards closure which is great  news. Objective Constitutional Obese and well-hydrated in no acute distress. Vitals Time Taken: 11:14 AM, Height: 63 in, Weight: 372 lbs, BMI: 65.9, Temperature: 97.5 F, Pulse: 111 bpm, Respiratory Rate: 18 breaths/min, Blood Pressure: 136/85 mmHg. Respiratory normal breathing without difficulty. Psychiatric this patient is able to make decisions and demonstrates good insight into disease process. Alert and Oriented x 3. pleasant and cooperative. General Notes: Upon inspection patient's wound bed actually showed signs of good granulation and epithelization at this point. Fortunately I think that she does not have any wounds open and her lymphedema seems to be doing quite well she is not nearly as tense and tight there she has been there is no signs of cellulitis. Other Condition(s) Patient presents with Lymphedema located on the Right Leg. General Notes: Leg looks really good, no redness, drainage or odor noted Patient presents with Lymphedema located on the Left Leg. 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 Wash wounds with antibacterial soap and water. May shower; gently cleanse wound with antibacterial soap, rinse and pat dry prior to dressing wounds No tub bath. Non-Wound Condition: Cleanse affected area with antibacterial soap and water, Medications-Please add to medication list.: Topical Antibiotic - apply Mupirocin to weepy areas as needed 1. I would recommend currently that  we have the patient going continue to monitor for any signs of infection or worsening. In general I think she is doing well her skin seems to be intact, free of infection, and overall her legs are not nearly  as tense and tight as what they have been in the past. 2. I am good recommend as well she continue to use her pumps, elevate her legs, and we will see how things stand at follow-up. We will see patient back for reevaluation in 4 weeks here in the clinic. If anything worsens or changes patient will contact our office for additional recommendations. Electronic Signature(s) Signed: 06/19/2023 7:35:51 PM By: Allen Derry PA-C Entered By: Allen Derry on 06/19/2023 19:35:51 -------------------------------------------------------------------------------- SuperBill Details Patient Name: Date of Service: REHANNA, WILLBORN 06/18/2023 Medical Record Number: [id number] Patient Account Number: [id number] Date of Birth/Sex: Treating RN: 1956-01-18 (67 y.o. 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 Facility Procedures : CPT4 Code: 18841660 Description: [id number] - WOUND CARE VISIT-LEV 2 EST PT Modifier: Quantity: 1 Physician Procedures : CPT4 Code Description Modifier [id number] - WC PHYS LEVEL 3 - EST PT ICD-10 Diagnosis Description Q82.0 Hereditary lymphedema L97.811 Non-pressure chronic ulcer of other part of right lower leg limited to breakdown of skin L97.821 Non-pressure  chronic ulcer of other part of left lower leg limited to breakdown of skin Quantity: 1 Electronic  Signature(s) Signed: 06/19/2023 7:36:39 PM By: Allen Derry PA-C Previous Signature: 06/18/2023 4:48:05 PM Version By: Angelina Pih Entered By: Allen Derry on 06/19/2023 19:36:38 [name] ([id number]) [id number] Page 15 of 15

## 2023-06-28 ENCOUNTER — Telehealth: Payer: Self-pay | Admitting: Licensed Clinical Social Worker

## 2023-06-28 ENCOUNTER — Ambulatory Visit: Payer: Medicare Other

## 2023-06-28 NOTE — Telephone Encounter (Signed)
CSW contacted patient to inform that appointment with Health Coach will be cancelled today and will be contacted next week to reschedule. Lasandra Beech, LCSW, CCSW-MCS [phone]

## 2023-07-09 ENCOUNTER — Telehealth: Payer: Self-pay

## 2023-07-09 DIAGNOSIS — Z0000 Encounter for general adult medical examination without abnormal findings: Secondary | ICD-10-CM

## 2023-07-09 NOTE — Telephone Encounter (Signed)
 Called patient to reschedule health coaching appointment from 12/20. Patient is available on 1/3 at 3:00pm. Patient was scheduled accordingly and will be called at that time.   Greig Ruth, MS, ERHD, [facility]  Care Guide, Health & Wellness Coach [address], [address] [name] [address] Telephone: [phone] Email: [email or web address]

## 2023-07-12 ENCOUNTER — Ambulatory Visit: Payer: Medicare Other | Attending: Internal Medicine

## 2023-07-12 DIAGNOSIS — Z0000 Encounter for general adult medical examination without abnormal findings: Secondary | ICD-10-CM

## 2023-07-12 NOTE — Progress Notes (Signed)
 Appointment Outcome: Completed, Session #: 1                         Start time: 3:02pm   End time: 3:32pm   Total Mins: 30 minutes  AGREEMENTS SECTION   Overall Goal(s): Improve healthy eating behaviors over the next 3 months to aid in weight loss and stabilize glucose levels.                           Agreement/Action Steps:  Improve healthy eating behaviors Meal planning for 2-3 days at a time Practice portion control by using the diabetic plate method Take Wegovy  as prescribed Read food label to monitor sodium and calories    Progress Notes:  Patient reported that she has lost an additional 5 lbs. since starting health coaching and a total of 28 lbs. since start Wegovy . Patient mentioned that her medication dosage has increased, and she is finding that she is having to adjust her perception around what portion control looked like for her a month ago compared to now. Patient explained that she found that the amount of food that she would normally meal plan/prep for 3 days is one week's worth of food.   Patient stated that she has incorporated asking herself questions before eating at specific times such as putting away leftovers or when she is bored at night if she is hungry. Patient stated that she talks herself through those moments or read to help redirect her attention.  Patient stated that she is being more conscious of listening to her stomach when she is satisfied compared to what her appetite is saying. Patient provided examples of times that she prepared her meal on a salad plate and found that she could not eat the food all at once and needed to finish the meal later.   Patient mentioned that from this experience she would start eating small meals approximately every 3 hours to avoid drastic drops in her glucose level to where she become symptomatic. Patient stated that she's only had that experience once recently and that was because she went to long between meals before  eating.   Patient explained that she has been working on increasing her water intake to the recommended 40 oz per her provider. Patient stated that she has continued to read food labels but has a challenge with feeling the need to add a pinch of salt to her meat in order for it to be palatable. Patient stated she has tried seasoning her foods with herbs and spices but certain foods she cannot eat without adding a small amount. Patient stated that she continues to rinse her canned goods and look for a lot of products that are low in sodium.   Indicators of Success and Accountability:  Patient stated that she has become more conscious of her portions and volume of food she prepares at one time.  Readiness: Patient is in the action stage of improving healthy eating behaviors to aid in weight loss and stabilize glucose levels.  Strengths and Supports: Patient is being supported by her son. Patient is relying on being a mindful eater.   Challenges and Barriers: Patient has a challenge with refraining from added salt to certain foods such as meat.   Coaching Outcomes: Patient stated that she will start eating approximately every 3 hours to help with practicing portion control better and ensuring that she keep her blood glucose stable.  Patient was informed that the daily intake of sodium recommended for individual with hypertension by the American Heart Association is 1500 mg of sodium per day. Patient expressed that she would play closer attention to how much sodium she is consuming but has noticed swelling when she consumes more than normal.  Patient expressed desire to increase her water intake to approximately 40 oz of water per day but explained that her total intake of fluid is 64 oz recommended by her provider.   Attempted: Fulfilled - Patient has been meal planning as planned, taking Wegovy  as prescribed, and practicing portion control.  Partial - Patient has been reading food labels to  monitor sodium and calories on some food items.    Resources: Patient was mailed information on how to read food labels, how much sodium is in salt, meal planning sheets, and strategies on how to implement a low sodium diet for her review.

## 2023-07-19 NOTE — Progress Notes (Deleted)
 PCP: [facility] (last seen 08/24) Primary Cardiologist: Marcina Millard, MD/ Leanora Ivanoff, [place] (last seen 10/24; returns 04/25)  Chief Complaint:  HPI:  Ms Cannizzo is a 68 y/o female with a history of asthma, HTN, arthritis, lymphedema, CKD, secondary hyperparathyroidism, sleep apnea, anemia, spinal stenosis, obesity, previous tobacco use and chronic heart failure.   Admitted 05/10/22 due to worsening edema and drainage. Patient evaluated and found to have cellulitis of the right lower extremity. Had AKI. Vascular and wound consults obtained. Antibiotics started. Became symptomatic with elevated lactic acid and WBC. IVF given and antibiotic changed. CT scan 11/7 did not show any drainable fluid collection. Discharged after 6 days.   Echo 04/17/18: EF 65% with PA pressure of 43 mmHg Echo 04/13/19: EF 55-60% with mild LAE and trace MR Echo 11/01/21: EF of 60-65%.   She presents today for a HF follow-up visit with a chief complaint of   At last visit, wegovy was increased to 1mg .   ROS: All systems negative except as listed in HPI, PMH and Problem List.  SH:  Social History   Socioeconomic History   Marital status: Single    Spouse name: Not on file   Number of children: Not on file   Years of education: Not on file   Highest education level: Not on file  Occupational History   Not on file  Tobacco Use   Smoking status: Former    Current packs/day: 0.00    Average packs/day: 0.3 packs/day for 14.0 years (3.5 ttl pk-yrs)    Types: Cigarettes    Start date: 07/09/1976    Quit date: 07/09/1990    Years since quitting: 33.0   Smokeless tobacco: Current    Types: Snuff  Vaping Use   Vaping status: Never Used  Substance and Sexual Activity   Alcohol use: No   Drug use: No   Sexual activity: Not Currently  Other Topics Concern   Not on file  Social History Narrative   Not on file   Social Drivers of Health   Financial Resource Strain: Not on file  Food Insecurity: No Food  Insecurity (09/06/2022)   [facility]   Hunger Vital Sign    Worried About Running Out of Food in the Last Year: Never true    Ran Out of Food in the Last Year: Never true  Transportation Needs: No Transportation Needs (09/06/2022)   [facility]   [facility] - Transportation    Lack of Transportation (Medical): No    Lack of Transportation (Non-Medical): No  Physical Activity: Not on file  Stress: Not on file  Social Connections: Not on file  Intimate Partner Violence: Not At Risk (05/10/2022)   Humiliation, Afraid, Rape, and Kick questionnaire    Fear of Current or Ex-Partner: No    Emotionally Abused: No    Physically Abused: No    Sexually Abused: No    FH:  Family History  Problem Relation Age of Onset   Breast cancer Maternal Aunt    Thyroid disease Other     Past Medical History:  Diagnosis Date   Arthritis    Asthma    CHF (congestive heart failure) (HCC)    Enlarged heart    Hypertension    Lymphedema    Sleep apnea    Spinal stenosis     Current Outpatient Medications  Medication Sig Dispense Refill   acetaminophen (TYLENOL) 500 MG tablet Take 500-1,000 mg by mouth  every 6 (six) hours as needed for mild pain or fever.     albuterol (VENTOLIN HFA) 108 (90 Base) MCG/ACT inhaler Inhale 1-2 puffs into the lungs every 6 (six) hours as needed for wheezing or shortness of breath.     bumetanide (BUMEX) 2 MG tablet Take 1 tablet (2 mg total) by mouth daily. Stop Furosemide 30 tablet 4   calcitRIOL (ROCALTROL) 0.25 MCG capsule Take 0.25 mcg by mouth daily.     cetirizine (ZYRTEC) 10 MG tablet Take 1 tablet (10 mg total) by mouth daily. (Patient taking differently: Take 10 mg by mouth daily as needed for allergies.) 60 tablet 0   dapagliflozin propanediol (FARXIGA) 10 MG TABS tablet Take 1 tablet (10 mg total) by mouth daily before breakfast. 30 tablet 11   KLOR-CON M20 20 MEQ tablet Take 2 tablets  (40 mEq total) by mouth daily. 180 tablet 2   metolazone (ZAROXOLYN) 5 MG tablet Take 0.5 tablets (2.5 mg total) by mouth 2 (two) times a week. 12 tablet 2   mupirocin ointment (BACTROBAN) 2 % Apply 1 Application topically daily as needed (wound care).     NYSTATIN powder Apply 1 Application topically 2 (two) times daily as needed (Yeast Skin infection).     Semaglutide-Weight Management (WEGOVY) 1 MG/0.5ML SOAJ Inject 1 mg into the skin once a week. 2 mL 2   senna-docusate (SENOKOT-S) 8.6-50 MG tablet Take 1 tablet by mouth 2 (two) times daily as needed for mild constipation or moderate constipation.     spironolactone (ALDACTONE) 25 MG tablet TAKE 0.5 TABLET BY MOUTH ONCE DAILY. 15 tablet 6   No current facility-administered medications for this visit.     PHYSICAL EXAM:  General:  Well appearing. No resp difficulty HEENT: normal Neck: supple. JVP flat. No lymphadenopathy or thryomegaly appreciated. Cor: PMI normal. Regular rate & rhythm. No rubs, gallops or murmurs. Lungs: clear Abdomen: soft, nontender, nondistended. No hepatosplenomegaly. No bruits or masses.  Extremities: no cyanosis, clubbing, rash. + bilateral lymphedema Neuro: alert & oriented x3, cranial nerves grossly intact. Moves all 4 extremities w/o difficulty. Affect pleasant.   ECG: not done   ASSESSMENT & PLAN:  1: Chronic heart failure with preserved ejection fraction- - likely HTN/ sleep apnea - NYHA class III - euvolemic today - weight 328.8 pounds from last visit here 1 month ago - Echo 04/17/18: EF 65% with PA pressure of 43 mmHg - Echo 04/13/19: EF 55-60% with mild LAE and trace MR - Echo 11/01/21: EF of 60-65%. - reviewed the importance of keeping her sodium 2000mg  as well as keeping daily fluid intake to 60-64 ounces/ day - facial/mouth swelling with ACEi - continue bumex 2mg  QOD - continue farxiga 10mg   - continue potassium daily - continue metolazone 2.5mg  twice weekly - continue  spironolactone 12.5mg  daily - saw cardiology Mellissa Kohut) 10/24 - BNP 10/31/21 was 123.9  2: HTN with stage 3 CKD- - BP  - sees PCP Jesse Sans) @ [facility]  - saw nephrology Cherylann Ratel) 11/24  - BMP 05/16/23 reviewed and showed sodium 137, potassium 3.9, creatinine 1.6 and GFR 35  3: Lymphedema- - saw wound center 12/24  4: Sleep apnea- - not wearing CPAP nightly due to having water in the mask; insurance will not allow another CPAP machine until 2025 - saw pulmonology Welton Flakes) 04/24  5: Morbid obesity- - BMI 04/16/23 was 58.06 which is down from 61.90  - has lost      pounds since May 2024 (361 lbs); trying  to increase her activity when she's able - increase wegovy to 1mg  weekly; reminded to limit fried/ fatty foods to lessen the chance of nausea - continues to participate in health coach and finds it helpful - unable to attend [place] [facility] clinic due to transportation issues

## 2023-07-23 ENCOUNTER — Encounter: Payer: Medicare HMO | Admitting: Family

## 2023-07-23 ENCOUNTER — Ambulatory Visit: Payer: Medicare HMO | Admitting: Physician Assistant

## 2023-07-26 ENCOUNTER — Telehealth: Payer: Self-pay

## 2023-07-26 ENCOUNTER — Ambulatory Visit: Payer: Medicare Other

## 2023-07-26 DIAGNOSIS — Z0000 Encounter for general adult medical examination without abnormal findings: Secondary | ICD-10-CM

## 2023-07-26 NOTE — Telephone Encounter (Signed)
Called patient as scheduled for health coaching session. Patient stated that this was not a good time for her to have her appt because she was under the weather and requested to be rescheduled to next week. Patient has been rescheduled as requested to 1/21 at 3:30pm. Patient will be called at that time.    Renaee Munda, MS, ERHD, [facility]  Care Guide, Health & Wellness Coach [address], [address] [place] [address] Telephone: [phone] Email: [email or web address]

## 2023-07-29 NOTE — Progress Notes (Unsigned)
PCP: [facility] (last seen 08/24) Primary Cardiologist: Marcina Millard, MD/ Leanora Ivanoff, [place] (last seen 10/24; returns 04/25)  Chief Complaint:  HPI:  [name] is a 68 y/o female with a history of asthma, HTN, arthritis, lymphedema, CKD, secondary hyperparathyroidism, sleep apnea, anemia, spinal stenosis, obesity, previous tobacco use and chronic heart failure.   Admitted 05/10/22 due to worsening edema and drainage. Patient evaluated and found to have cellulitis of the right lower extremity. Had AKI. Vascular and wound consults obtained. Antibiotics started. Became symptomatic with elevated lactic acid and WBC. IVF given and antibiotic changed. CT scan 11/7 did not show any drainable fluid collection. Discharged after 6 days.   Echo 04/17/18: EF 65% with PA pressure of 43 mmHg Echo 04/13/19: EF 55-60% with mild LAE and trace MR Echo 11/01/21: EF of 60-65%.   She presents today for a HF follow-up visit with a chief complaint of   At last visit, wegovy was increased to 1mg .   ROS: All systems negative except as listed in HPI, PMH and Problem List.  SH:  Social History   Socioeconomic History   Marital status: Single    Spouse name: Not on file   Number of children: Not on file   Years of education: Not on file   Highest education level: Not on file  Occupational History   Not on file  Tobacco Use   Smoking status: Former    Current packs/day: 0.00    Average packs/day: 0.3 packs/day for 14.0 years (3.5 ttl pk-yrs)    Types: Cigarettes    Start date: 07/09/1976    Quit date: 07/09/1990    Years since quitting: 33.0   Smokeless tobacco: Current    Types: Snuff  Vaping Use   Vaping status: Never Used  Substance and Sexual Activity   Alcohol use: No   Drug use: No   Sexual activity: Not Currently  Other Topics Concern   Not on file  Social History Narrative   Not on file   Social Drivers of Health   Financial Resource Strain: Not on file  Food Insecurity: No Food  Insecurity (09/06/2022)   [facility]   Hunger Vital Sign    Worried About Running Out of Food in the Last Year: Never true    Ran Out of Food in the Last Year: Never true  Transportation Needs: No Transportation Needs (09/06/2022)   [facility]   [facility] - Transportation    Lack of Transportation (Medical): No    Lack of Transportation (Non-Medical): No  Physical Activity: Not on file  Stress: Not on file  Social Connections: Not on file  Intimate Partner Violence: Not At Risk (05/10/2022)   Humiliation, Afraid, Rape, and Kick questionnaire    Fear of Current or Ex-Partner: No    Emotionally Abused: No    Physically Abused: No    Sexually Abused: No    FH:  Family History  Problem Relation Age of Onset   Breast cancer Maternal Aunt    Thyroid disease Other     Past Medical History:  Diagnosis Date   Arthritis    Asthma    CHF (congestive heart failure) (HCC)    Enlarged heart    Hypertension    Lymphedema    Sleep apnea    Spinal stenosis     Current Outpatient Medications  Medication Sig Dispense Refill   acetaminophen (TYLENOL) 500 MG tablet Take 500-1,000 mg by mouth  every 6 (six) hours as needed for mild pain or fever.     albuterol (VENTOLIN HFA) 108 (90 Base) MCG/ACT inhaler Inhale 1-2 puffs into the lungs every 6 (six) hours as needed for wheezing or shortness of breath.     bumetanide (BUMEX) 2 MG tablet Take 1 tablet (2 mg total) by mouth daily. Stop Furosemide 30 tablet 4   calcitRIOL (ROCALTROL) 0.25 MCG capsule Take 0.25 mcg by mouth daily.     cetirizine (ZYRTEC) 10 MG tablet Take 1 tablet (10 mg total) by mouth daily. (Patient taking differently: Take 10 mg by mouth daily as needed for allergies.) 60 tablet 0   dapagliflozin propanediol (FARXIGA) 10 MG TABS tablet Take 1 tablet (10 mg total) by mouth daily before breakfast. 30 tablet 11   KLOR-CON M20 20 MEQ tablet Take 2 tablets  (40 mEq total) by mouth daily. 180 tablet 2   metolazone (ZAROXOLYN) 5 MG tablet Take 0.5 tablets (2.5 mg total) by mouth 2 (two) times a week. 12 tablet 2   mupirocin ointment (BACTROBAN) 2 % Apply 1 Application topically daily as needed (wound care).     NYSTATIN powder Apply 1 Application topically 2 (two) times daily as needed (Yeast Skin infection).     Semaglutide-Weight Management (WEGOVY) 1 MG/0.5ML SOAJ Inject 1 mg into the skin once a week. 2 mL 2   senna-docusate (SENOKOT-S) 8.6-50 MG tablet Take 1 tablet by mouth 2 (two) times daily as needed for mild constipation or moderate constipation.     spironolactone (ALDACTONE) 25 MG tablet TAKE 0.5 TABLET BY MOUTH ONCE DAILY. 15 tablet 6   No current facility-administered medications for this visit.     PHYSICAL EXAM:  General:  Well appearing. No resp difficulty HEENT: normal Neck: supple. JVP flat. No lymphadenopathy or thryomegaly appreciated. Cor: PMI normal. Regular rate & rhythm. No rubs, gallops or murmurs. Lungs: clear Abdomen: soft, nontender, nondistended. No hepatosplenomegaly. No bruits or masses.  Extremities: no cyanosis, clubbing, rash. + bilateral lymphedema Neuro: alert & oriented x3, cranial nerves grossly intact. Moves all 4 extremities w/o difficulty. Affect pleasant.   ECG: not done   ASSESSMENT & PLAN:  1: Chronic heart failure with preserved ejection fraction- - likely HTN/ sleep apnea - NYHA class III - euvolemic today - weight 328.8 pounds from last visit here 1 month ago - Echo 04/17/18: EF 65% with PA pressure of 43 mmHg - Echo 04/13/19: EF 55-60% with mild LAE and trace MR - Echo 11/01/21: EF of 60-65%. - reviewed the importance of keeping her sodium 2000mg  as well as keeping daily fluid intake to 60-64 ounces/ day - facial/mouth swelling with ACEi - continue bumex 2mg  QOD - continue farxiga 10mg   - continue potassium daily - continue metolazone 2.5mg  twice weekly - continue  spironolactone 12.5mg  daily - saw cardiology Mellissa Kohut) 10/24 - BNP 10/31/21 was 123.9  2: HTN with stage 3 CKD- - BP  - sees PCP Jesse Sans) @ [facility]  - saw nephrology Cherylann Ratel) 11/24  - BMP 05/16/23 reviewed and showed sodium 137, potassium 3.9, creatinine 1.6 and GFR 35  3: Lymphedema- - saw wound center 12/24  4: Sleep apnea- - not wearing CPAP nightly due to having water in the mask; insurance will not allow another CPAP machine until 2025 - saw pulmonology Welton Flakes) 04/24  5: Morbid obesity- - BMI 04/16/23 was 58.06 which is down from 61.90  - has lost      pounds since May 2024 (361 lbs); trying  to increase her activity when she's able - increase wegovy to 1mg  weekly; reminded to limit fried/ fatty foods to lessen the chance of nausea - continues to participate in health coach and finds it helpful - unable to attend [place] [facility] clinic due to transportation issues

## 2023-07-30 ENCOUNTER — Ambulatory Visit: Payer: Medicare Other | Attending: Family | Admitting: Family

## 2023-07-30 ENCOUNTER — Encounter: Payer: Self-pay | Admitting: Family

## 2023-07-30 ENCOUNTER — Ambulatory Visit: Payer: Medicare Other | Attending: Family Medicine

## 2023-07-30 ENCOUNTER — Encounter: Payer: Medicare Other | Attending: Physician Assistant | Admitting: Physician Assistant

## 2023-07-30 VITALS — BP 104/82 | HR 81 | Resp 14 | Ht 63.0 in | Wt 322.2 lb

## 2023-07-30 DIAGNOSIS — I5032 Chronic diastolic (congestive) heart failure: Secondary | ICD-10-CM

## 2023-07-30 DIAGNOSIS — L97821 Non-pressure chronic ulcer of other part of left lower leg limited to breakdown of skin: Secondary | ICD-10-CM | POA: Insufficient documentation

## 2023-07-30 DIAGNOSIS — I509 Heart failure, unspecified: Secondary | ICD-10-CM | POA: Diagnosis not present

## 2023-07-30 DIAGNOSIS — J45909 Unspecified asthma, uncomplicated: Secondary | ICD-10-CM | POA: Diagnosis not present

## 2023-07-30 DIAGNOSIS — N2581 Secondary hyperparathyroidism of renal origin: Secondary | ICD-10-CM | POA: Insufficient documentation

## 2023-07-30 DIAGNOSIS — M1990 Unspecified osteoarthritis, unspecified site: Secondary | ICD-10-CM | POA: Diagnosis not present

## 2023-07-30 DIAGNOSIS — L97811 Non-pressure chronic ulcer of other part of right lower leg limited to breakdown of skin: Secondary | ICD-10-CM | POA: Insufficient documentation

## 2023-07-30 DIAGNOSIS — Z6841 Body Mass Index (BMI) 40.0 and over, adult: Secondary | ICD-10-CM | POA: Diagnosis not present

## 2023-07-30 DIAGNOSIS — I428 Other cardiomyopathies: Secondary | ICD-10-CM | POA: Diagnosis not present

## 2023-07-30 DIAGNOSIS — Q820 Hereditary lymphedema: Secondary | ICD-10-CM | POA: Insufficient documentation

## 2023-07-30 DIAGNOSIS — R220 Localized swelling, mass and lump, head: Secondary | ICD-10-CM | POA: Insufficient documentation

## 2023-07-30 DIAGNOSIS — G4730 Sleep apnea, unspecified: Secondary | ICD-10-CM | POA: Diagnosis not present

## 2023-07-30 DIAGNOSIS — I890 Lymphedema, not elsewhere classified: Secondary | ICD-10-CM | POA: Insufficient documentation

## 2023-07-30 DIAGNOSIS — Z79899 Other long term (current) drug therapy: Secondary | ICD-10-CM | POA: Insufficient documentation

## 2023-07-30 DIAGNOSIS — I130 Hypertensive heart and chronic kidney disease with heart failure and stage 1 through stage 4 chronic kidney disease, or unspecified chronic kidney disease: Secondary | ICD-10-CM | POA: Diagnosis present

## 2023-07-30 DIAGNOSIS — Z0000 Encounter for general adult medical examination without abnormal findings: Secondary | ICD-10-CM

## 2023-07-30 DIAGNOSIS — E669 Obesity, unspecified: Secondary | ICD-10-CM | POA: Diagnosis not present

## 2023-07-30 DIAGNOSIS — G4733 Obstructive sleep apnea (adult) (pediatric): Secondary | ICD-10-CM | POA: Diagnosis not present

## 2023-07-30 DIAGNOSIS — Z87891 Personal history of nicotine dependence: Secondary | ICD-10-CM | POA: Diagnosis not present

## 2023-07-30 DIAGNOSIS — I10 Essential (primary) hypertension: Secondary | ICD-10-CM

## 2023-07-30 DIAGNOSIS — N183 Chronic kidney disease, stage 3 unspecified: Secondary | ICD-10-CM | POA: Diagnosis not present

## 2023-07-30 MED ORDER — WEGOVY 1.7 MG/0.75ML ~~LOC~~ SOAJ: 1.7000 mg | SUBCUTANEOUS | 2 refills | Status: DC

## 2023-07-30 NOTE — Progress Notes (Addendum)
ARGIRO, BOUNDY ([id number]) [id number] Page 1 of 6 Visit Report for 07/30/2023 Chief Complaint Document Details Patient Name: Date of Service: [name], [name] 07/30/2023 10:00 A M Medical Record Number: [id number] Patient Account Number: [id number] Date of Birth/Sex: Treating RN: [birth year] (68 y.o. [name] Primary Care Provider: [name] Crutch Other Clinician: Betha Loa Referring Provider: Treating Provider/Extender: Wynona Dove in Treatment: 104 Information Obtained from: Patient Chief Complaint Bilateral LE lymphedema with Left LE Ulcers Electronic Signature(s) Signed: 07/30/2023 10:28:29 AM By: Allen Derry PA-C Entered By: Allen Derry on 07/30/2023 10:28:29 -------------------------------------------------------------------------------- HPI Details Patient Name: Date of Service: [name], [name] 07/30/2023 10:00 A M Medical Record Number: [id number] Patient Account Number: [id number] Date of Birth/Sex: Treating RN: [birth year] (68 y.o. [name] Primary Care Provider: [name] Crutch Other Clinician: Betha Loa Referring Provider: Treating Provider/Extender: Wynona Dove in Treatment: 104 History of Present Illness HPI Description: Readmission: 08/01/2021 upon evaluation today patient appears to be doing actually pretty well all things considered. Is actually been almost a year since have seen her last March. Fortunately I do not see any evidence of active infection locally nor systemically at this point. She does have a couple open areas on the left leg the right leg appears to be doing quite well. In general she has been in the hospital 3 times since I saw her a year ago all 4 cellulitis type issues except for the last 1 which was actually more related to congestive heart failure for that reason she is not using her lymphedema pumps at this point and that is probably the  best idea. 07-30-2023 upon evaluation today patient appears to be doing excellent in regard to her legs. There is no signs of anything open at this point overall and very pleased with where we stand I do believe that she is making excellent progress towards keeping everything under control it is been over a year now since she has been in the hospital which is awesome news. This is a milestone that she has not hit up to this point she had been in the hospital fairly regularly in 2023 she made all the way through 2024 with no issues. Electronic Signature(s) Signed: 07/30/2023 2:03:51 PM By: Ginger Organ, [place] C ([id number]) PM By: Allen Derry PA-C [phone].pdf Page 2 of 6 Signed: 07/30/2023 2:03:51 Entered By: Allen Derry on 07/30/2023 14:03:51 -------------------------------------------------------------------------------- Physical Exam Details Patient Name: Date of Service: [name], [name] 07/30/2023 10:00 A M Medical Record Number: [id number] Patient Account Number: [id number] Date of Birth/Sex: Treating RN: 22-May-1956 (68 y.o. [name] Primary Care Provider: [name] Crutch Other Clinician: Betha Loa Referring Provider: Treating Provider/Extender: Wynona Dove in Treatment: 104 Constitutional Obese and well-hydrated in no acute distress. Respiratory normal breathing without difficulty. Psychiatric this patient is able to make decisions and demonstrates good insight into disease process. Alert and Oriented x 3. pleasant and cooperative. Notes Upon inspection patient's wound bed actually appears to be doing significantly better she has lost quite a bit of weight and seems to be doing much better and feeling much better. I am actually very pleased with how she is doing and I am happy for her with regard to the weight loss as well. Electronic Signature(s) Signed: 07/30/2023 2:04:09 PM By: Allen Derry PA-C Entered By:  Allen Derry on 07/30/2023 14:04:09 -------------------------------------------------------------------------------- Physician Orders Details Patient Name: Date of Service: [name], [name] 07/30/2023 10:00 A M Medical  Record Number: [id number] Patient Account Number: [id number] Date of Birth/Sex: Treating RN: [birth year] (68 y.o. 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 Wash wounds with antibacterial soap and water. May shower; gently cleanse wound with antibacterial soap, rinse and pat dry prior to dressing wounds No tub bath. Non-Wound Condition Bilateral Lower Extremities Cleanse affected area with antibacterial soap and water, Medications-Please add to medication list. ntibiotic - apply Mupirocin to weepy areas as needed Topical A Electronic Signature(s) Signed: 07/30/2023 5:15:40 PM By: Betha Loa Signed: 07/30/2023 6:09:39 PM By: Allen Derry PA-C Entered By: Betha Loa on 07/30/2023 10:35:49 -------------------------------------------------------------------------------- Problem List Details Patient Name: Date of Service: [name]. 07/30/2023 10:00 A M Medical Record Number: [id number] Patient  Account Number: [id number] Date of Birth/Sex: Treating RN: 1956-06-26 (68 y.o. 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 08/01/2021 No Yes of skin L97.821 Non-pressure chronic ulcer of other part of left lower leg limited to breakdown 08/01/2021 No Yes of skin Inactive Problems Resolved Problems Electronic Signature(s) Signed: 07/30/2023 10:28:24 AM By: Allen Derry PA-C Entered By: Allen Derry on 07/30/2023 10:28:24 [name] ([id number]) [id number] Page 4 of 6 -------------------------------------------------------------------------------- Progress Note Details Patient Name: Date of Service: [name], [name] 07/30/2023 10:00 A M Medical Record Number: [id number] Patient Account Number: [id number] Date of Birth/Sex: Treating RN: 1956-03-07 (68 y.o. [name] Primary Care Provider: [name] Crutch Other Clinician: Betha Loa Referring Provider: Treating Provider/Extender: Wynona Dove in Treatment: 104 Subjective Chief Complaint Information obtained from Patient Bilateral LE lymphedema with Left LE Ulcers History of Present Illness (HPI) Readmission: 08/01/2021 upon evaluation today patient appears to be doing actually pretty well all things considered. Is actually been almost a year since have seen her last March. Fortunately I do not see any evidence of active infection locally nor systemically at this point. She does have a couple open areas on the left leg the right leg appears to be doing quite well. In general she has been in the hospital 3 times since I saw her a  year ago all 4 cellulitis type issues except for the last 1 which was actually more related to congestive heart failure for that reason she is not using her lymphedema pumps at this point and that is probably the best idea. 07-30-2023 upon evaluation today patient appears to be doing excellent in regard to her legs. There is no signs of anything open at this point overall and very pleased with where we stand I do believe that she is making excellent progress towards keeping everything under control it is been over  a year now since she has been in the hospital which is awesome news. This is a milestone that she has not hit up to this point she had been in the hospital fairly regularly in 2023 she made all the way through 2024 with no issues. Objective Constitutional Obese and well-hydrated in no acute distress. Vitals Time Taken: 10:18 AM, Height: 63 in, Weight: 372 lbs, BMI: 65.9, Temperature: 97.9 F, Pulse: 86 bpm, Respiratory Rate: 18 breaths/min, Blood Pressure: 116/81 mmHg. Respiratory normal breathing without difficulty. Psychiatric this patient is able to make decisions and demonstrates good insight into disease process. Alert and Oriented x 3. pleasant and cooperative. General Notes: Upon inspection patient's wound bed actually appears to be doing significantly better she has lost quite a bit of weight and seems to be doing much better and feeling much better. I am actually very pleased with how she is doing and I am happy for her with regard to the weight loss as well. 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 Wash wounds with antibacterial soap and water. May shower; gently cleanse wound with antibacterial soap, rinse and pat dry prior to dressing wounds No tub bath. Non-Wound Condition: Cleanse affected area with antibacterial soap and water, Medications-Please add to medication list.: Topical Antibiotic - apply Mupirocin to weepy areas as needed 1. I would recommend based on what we are seeing that we have the patient going to continue to monitor for any signs of infection or worsening. I do believe that we will making good headway here towards closure. 2. Also can recommend that we have the patient continue with the mupirocin topically and then subsequently this would just be to any open wounds that occur but right now she is really doing quite well there is nothing open at the moment. 3. She has visually lost quite a bit of weight and states she feels so much better I think this is showing and the fact that she is also not having as much swelling in her legs as well which is great news. We will see patient back for reevaluation in 4 weeks here in the clinic. If anything worsens or changes patient will contact our office for additional recommendations. This is just to continue to check on her to make sure she does not develop any worsening issues. At the moment she seems to be doing quite well but at the same time with her significant primary lymphedema she is at high risk for developing more significant issues rather rapidly in the past she has had some issues with not be able to get in as frequently as we would like to get her in with something does happen. That is the reason why have her coming on a regular basis at this point once a month just to ensure everything is doing well. Electronic Signature(s) Signed: 07/30/2023 2:06:13 PM By: Allen Derry PA-C Previous Signature: 07/30/2023 2:05:02 PM Version By: Allen Derry PA-C Entered By:  Allen Derry on 07/30/2023 14:06:13 -------------------------------------------------------------------------------- SuperBill Details Patient Name: Date of Service: [name], [name] 07/30/2023 Medical Record Number: [id number] Patient Account Number: [id number] Date of Birth/Sex: Treating RN: 11/23/1955 (  68 y.o. 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.821 Non-pressure chronic ulcer of other part of left lower leg limited to breakdown of skin Facility Procedures : CPT4 Code: 16109604 Description: 54098 - WOUND CARE VISIT-LEV 2 EST PT Modifier: Quantity: 1 Physician Procedures : [facility] CPT4: Description Modifier Braelynne, Quam ([id number]) [id number] - WC PHYS LEVEL 3 - EST PT 1 ICD-10 Diagnosis Description 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

## 2023-07-30 NOTE — Progress Notes (Signed)
Appointment Outcome: Completed, Session #: 2                        Start time: 3:30pm   End time: 4:00pm   Total Mins: 30 minutes  AGREEMENTS SECTION   Overall Goal(s): Improve healthy eating behaviors over the next 3 months to aid in weight loss and stabilize glucose levels.                           Agreement/Action Steps:  Improve healthy eating behaviors Meal planning for 2-3 days at a time Practice portion control by using the diabetic plate method Patient stated that she will start eating approximately every 3 hours to help with practicing portion control better and ensuring that she keep her blood glucose stable.  Take RUEAVW as prescribed Read food label to monitor sodium and calories Aim to consume 1500 mg of sodium per day Increase water intake to approximately 40 oz of water per day and limit total intake of fluid to 64 oz as recommended by her provider.    Progress Notes:  Patient is practicing portion control by putting less volume of food on her plate. Patient began to describe what her portion sizes once were compared to now. Patient reported having aversions to foods that she used to enjoy eating. Patient shared that there are foods that she can do without but will not deny herself if she has a taste for it. Patient expressed that she is trying to make healthier choices so that they are sustainable when she is no longer taking Wegovy. Patient shared how she is mentally preparing herself to continue practicing portion control by purposefully not filling up her plate although visually her appetite may say otherwise.   Patient mentioned that she had one episode with low glucose due to sleeping late, eating one meal at 11:30am and going an extended period of time without eating due to having visitors. Patient stated that instead of eating what was already prepped she decided to prepare something else, which lead to the further drop in her glucose. Patient expressed that she  learned it is best to stick with her meal plan to avoid those situations and to eat as scheduled regardless of who is around. Patient stated that she discussed with family members her water intake goals because she was finding herself not drinking majority of her water during the day and getting thirsty around 9pm, which increases nocturia.  Patient reported that her son continues to be her accountability partner. Patient has noticed a change in how her clothes fit. Patient stated that she has been focusing on moving more. Patient shared how she has incorporated safe movements into her daily routine but wants to involve movement/exercise that will increase her heart rate to escalate her weight loss. Patient stated that she has done chair exercises before but is opened to doing exercises from YouTube or physical therapy exercises that she learned from previous treatment.   Indicators of Success and Accountability: Patient has lost a total of 11 lbs since starting health coaching to improve her healthy eating behaviors.   Readiness: Patient is in the action stage of improving her healthy eating behaviors to aid in weight loss and stabilize glucose levels.  Strengths and Supports: Patient is being supported by her family. Patient is relying on the strength to make sustainable choices that she can continue to implement into her lifestyle.  Challenges and  Barriers: Waiting too long in between meals, and patient not having a meal readily available to avoid having major drops in her glucose level.   Coaching Outcomes: Patient will continue to implement her health coaching agreement as outlined above over the next two weeks but wants to focus more on increasing physical activity and raising her heart rate.   Overall Goal(s): Improve healthy eating behaviors and increase physical activity over the next 3 months to aid in weight loss and stabilize glucose levels.                            Agreement/Action Steps:  Improve healthy eating behaviors Meal planning for 2-3 days at a time Practice portion control by using the diabetic plate method Eat approximately every 3 hours to help with practicing portion control better and ensuring that she keep her blood glucose stable.  Take MWUXLK as prescribed Read food label to monitor sodium and calories Aim to consume 1500 mg of sodium per day Increase water intake to approximately 40 oz of water per day and limit total intake of fluid to 64 oz as recommended by her provider  Increase physical activity Perform former physical therapy stretches/exercises Follow a YouTube chair yoga video Track increments of physical activity to calculate total amount of time engaged during the day    Attempted: Fulfilled - Patient is taking Wegovy as prescribed. Patient meal planned for 2-3 days at a time. Patient is reading food labels to monitor sodium and calorie consumption.  Partial - Patient meal plans/prep but has deviated away from the plan to prepare something else. Patient was able to consume 40 oz of water except on the days that she started consuming water later in the day. Patient is practicing portion control but not with the diabetes plate method.

## 2023-07-30 NOTE — Progress Notes (Signed)
[name], [name] ([id number]) [id number] Page 1 of 7 Visit Report for 07/30/2023 Arrival Information Details Patient Name: Date of Service: [name], [name] 07/30/2023 10:00 A M Medical Record Number: [id number] Patient Account Number: [id number] Date of Birth/Sex: Treating RN: 1955/11/22 (68 y.o. [name] Primary Care Keniya Schlotterbeck: [name] Crutch Other Clinician: Betha Loa Referring Carmino Ocain: Treating Valyn Latchford/Extender: Wynona Dove in Treatment: 104 Visit Information History Since Last Visit All ordered tests and consults were completed: No Patient Arrived: Dan Humphreys Added or deleted any medications: No Arrival Time: 10:16 Any new allergies or adverse reactions: No Transfer Assistance: None Had a fall or experienced change in No Patient Identification Verified: Yes activities of daily living that may affect Secondary Verification Process Completed: Yes risk of falls: Patient Requires Transmission-Based Precautions: No Signs or symptoms of abuse/neglect since last visito No Patient Has Alerts: Yes Hospitalized since last visit: No Patient Alerts: Borderline Diabetic Implantable device outside of the clinic excluding No cellular tissue based products placed in the center since last visit: Has Dressing in Place as Prescribed: No Has Compression in Place as Prescribed: No Pain Present Now: No Electronic Signature(s) Signed: 07/30/2023 5:15:40 PM By: Betha Loa Entered By: Betha Loa on 07/30/2023 10:17:58 -------------------------------------------------------------------------------- Clinic Level of Care Assessment Details Patient Name: Date of Service: FREE, REISSIG 07/30/2023 10:00 A M Medical Record Number: [id number] Patient Account Number: [id number] Date of Birth/Sex: Treating RN: 12-15-1955 (68 y.o. [name] Primary Care Franceska Strahm: [name] Crutch Other Clinician: Betha Loa Referring  Kanoelani Dobies: Treating [place] Gamero/Extender: Wynona Dove in Treatment: 104 Clinic Level of Care Assessment Items TOOL 4 Quantity Score []  - 0 Use when only an EandM is performed on FOLLOW-UP visit ASSESSMENTS - Nursing Assessment / Reassessment X- 1 10 Reassessment of Co-morbidities (includes updates in patient status) [name], [name] ([id number]) [phone] Page 2 of 7 X- 1 5 Reassessment of Adherence to Treatment Plan ASSESSMENTS - Wound and Skin A ssessment / Reassessment []  - 0 Simple Wound Assessment / Reassessment - one wound []  - 0 Complex Wound Assessment / Reassessment - multiple wounds X- 1 10 Dermatologic / Skin Assessment (not related to wound area) ASSESSMENTS - Focused Assessment X- 1 5 Circumferential Edema Measurements - multi extremities []  - 0 Nutritional Assessment / Counseling / Intervention []  - 0 Lower Extremity Assessment (monofilament, tuning fork, pulses) []  - 0 Peripheral Arterial Disease Assessment (using hand held doppler) ASSESSMENTS - Ostomy and/or Continence Assessment and Care []  - 0 Incontinence Assessment and Management []  - 0 Ostomy Care Assessment and Management (repouching, etc.) PROCESS - Coordination of Care X - Simple Patient / Family Education for ongoing care 1 15 []  - 0 Complex (extensive) Patient / Family Education for ongoing care []  - 0 Staff obtains Chiropractor, Records, T Results / Process Orders est []  - 0 Staff telephones HHA, Nursing Homes / Clarify orders / etc []  - 0 Routine Transfer to another Facility (non-emergent condition) []  - 0 Routine Hospital Admission (non-emergent condition) []  - 0 New Admissions / Manufacturing engineer / Ordering NPWT Apligraf, etc. , []  - 0 Emergency Hospital Admission (emergent condition) X- 1 10 Simple Discharge Coordination []  - 0 Complex (extensive) Discharge Coordination PROCESS - Special Needs []  - 0 Pediatric / Minor Patient  Management []  - 0 Isolation Patient Management []  - 0 Hearing / Language / Visual special needs []  - 0 Assessment of Community assistance (transportation, D/C planning, etc.) []  - 0 Additional assistance / Altered mentation []  -  0 Support Surface(s) Assessment (bed, cushion, seat, etc.) INTERVENTIONS - Wound Cleansing / Measurement []  - 0 Simple Wound Cleansing - one wound []  - 0 Complex Wound Cleansing - multiple wounds []  - 0 Wound Imaging (photographs - any number of wounds) []  - 0 Wound Tracing (instead of photographs) []  - 0 Simple Wound Measurement - one wound []  - 0 Complex Wound Measurement - multiple wounds INTERVENTIONS - Wound Dressings []  - 0 Small Wound Dressing one or multiple wounds []  - 0 Medium Wound Dressing one or multiple wounds []  - 0 Large Wound Dressing one or multiple wounds []  - 0 Application of Medications - topical []  - 0 Application of Medications - injection INTERVENTIONS - Miscellaneous [name], [name] ([id number]) [id number] Page 3 of 7 []  - 0 External ear exam []  - 0 Specimen Collection (cultures, biopsies, blood, body fluids, etc.) []  - 0 Specimen(s) / Culture(s) sent or taken to Lab for analysis []  - 0 Patient Transfer (multiple staff / Michiel Sites Lift / Similar devices) []  - 0 Simple Staple / Suture removal (25 or less) []  - 0 Complex Staple / Suture removal (26 or more) []  - 0 Hypo / Hyperglycemic Management (close monitor of Blood Glucose) []  - 0 Ankle / Brachial Index (ABI) - do not check if billed separately X- 1 5 Vital Signs Has the patient been seen at the hospital within the last three years: Yes Total Score: 60 Level Of Care: New/Established - Level 2 Electronic Signature(s) Signed: 07/30/2023 5:15:40 PM By: Betha Loa Entered By: Betha Loa on 07/30/2023 10:36:19 -------------------------------------------------------------------------------- Encounter Discharge Information  Details Patient Name: Date of Service: [name] 07/30/2023 10:00 A M Medical Record Number: [id number] Patient Account Number: [id number] Date of Birth/Sex: Treating RN: [birth year] (68 y.o. [name] Primary Care Iyanah Demont: [name] Crutch Other Clinician: Betha Loa Referring Askia Hazelip: Treating Shalin Linders/Extender: Wynona Dove in Treatment: [phone] Encounter Discharge Information Items Discharge Condition: Stable Ambulatory Status: Walker Discharge Destination: Home Transportation: Other Accompanied By: self Schedule Follow-up Appointment: Yes Clinical Summary of Care: Electronic Signature(s) Signed: 07/30/2023 5:15:40 PM By: Betha Loa Entered By: Betha Loa on 07/30/2023 10:39:30 [name] ([id number]) [id number] Page 4 of 7 -------------------------------------------------------------------------------- Lower Extremity Assessment Details Patient Name: Date of Service: [name], [name] 07/30/2023 10:00 A M Medical Record Number: [id number] Patient Account Number: [id number] Date of Birth/Sex: Treating RN: [birth year] (68 y.o. [name] Primary Care Doyle Tegethoff: [name] Crutch Other Clinician: Betha Loa Referring Khayla Koppenhaver: Treating Lanyiah Brix/Extender: Wynona Dove in Treatment: 104 Edema Assessment Assessed: Kyra Searles: Yes] Franne Forts: Yes] Edema: [Left: Yes] [Right: Yes] Calf Left: Right: Point of Measurement: 34 cm From Medial Instep 71.5 cm 75 cm Ankle Left: Right: Point of Measurement: 10 cm From Medial Instep 59 cm 58 cm Vascular Assessment Pulses: Dorsalis Pedis Palpable: [Left:Yes] [Right:Yes] Electronic Signature(s) Signed: 07/30/2023 5:12:31 PM By: Midge Aver MSN RN CNS WTA Signed: 07/30/2023 5:15:40 PM By: Betha Loa Entered By: Betha Loa on 07/30/2023 10:26:17 -------------------------------------------------------------------------------- Multi Wound  Chart Details Patient Name: Date of Service: [name] 07/30/2023 10:00 A M Medical Record Number: [id number] Patient Account Number: [id number] Date of Birth/Sex: Treating RN: 03-02-1956 (68 y.o. [name] Primary Care Eshani Maestre: [name] Crutch Other Clinician: Betha Loa Referring Dondrell Loudermilk: Treating Lavaeh Bau/Extender: Wynona Dove in Treatment: 104 Vital Signs Height(in): 63 Pulse(bpm): 86 Weight(lbs): 372 Blood Pressure(mmHg): 116/81 Body Mass Index(BMI): 65.9 Temperature(F): 97.9 Respiratory Rate(breaths/min): 18 [Treatment Notes:Wound Assessments Treatment Notes] Electronic Signature(s) Signed:  07/30/2023 5:15:40 PM By: Betha Loa Entered By: Betha Loa on 07/30/2023 10:26:27 [name] ([id number]) [id number] Page 5 of 7 -------------------------------------------------------------------------------- Multi-Disciplinary Care Plan Details Patient Name: Date of Service: [name], [name] 07/30/2023 10:00 A M Medical Record Number: [id number] Patient Account Number: [id number] Date of Birth/Sex: Treating RN: 04-02-1956 (68 y.o. [name] Primary Care Tyrelle Raczka: [name] Crutch Other Clinician: Betha Loa Referring Kyllie Pettijohn: Treating Teia Freitas/Extender: Wynona Dove in Treatment: 104 Active Inactive Wound/Skin Impairment Nursing Diagnoses: Impaired tissue integrity Knowledge deficit related to ulceration/compromised skin integrity Goals: Ulcer/skin breakdown will have a volume reduction of 30% by week 4 Date Initiated: 08/01/2021 Target Resolution Date: 08/29/2021 Goal Status: Active Ulcer/skin breakdown will have a volume reduction of 50% by week 8 Date Initiated: 08/01/2021 Target Resolution Date: 09/26/2021 Goal Status: Active Ulcer/skin breakdown will have a volume reduction of 80% by week 12 Date Initiated: 08/01/2021 Target Resolution Date: 10/24/2021 Goal Status:  Active Ulcer/skin breakdown will heal within 14 weeks Date Initiated: 08/01/2021 Target Resolution Date: 11/07/2021 Goal Status: Active Interventions: Assess patient/caregiver ability to obtain necessary supplies Assess patient/caregiver ability to perform ulcer/skin care regimen upon admission and as needed Assess ulceration(s) every visit Provide education on ulcer and skin care Notes: Electronic Signature(s) Signed: 07/30/2023 5:12:31 PM By: Midge Aver MSN RN CNS WTA Signed: 07/30/2023 5:15:40 PM By: Betha Loa Entered By: Betha Loa on 07/30/2023 10:38:22 Pain Assessment Details -------------------------------------------------------------------------------- [name] ([id number]) [id number] Page 6 of 7 Patient Name: Date of Service: [name], [name] 07/30/2023 10:00 A M Medical Record Number: [id number] Patient Account Number: [id number] Date of Birth/Sex: Treating RN: 10-09-1955 (68 y.o. [name] Primary Care Ruperto Kiernan: [name] Crutch Other Clinician: Betha Loa Referring Okechukwu Regnier: Treating Narvel Kozub/Extender: Wynona Dove in Treatment: [phone] Active Problems Location of Pain Severity and Description of Pain Patient Has Paino No Site Locations Pain Management and Medication Current Pain Management: Electronic Signature(s) Signed: 07/30/2023 5:12:31 PM By: Midge Aver MSN RN CNS WTA Signed: 07/30/2023 5:15:40 PM By: Betha Loa Entered By: Betha Loa on 07/30/2023 10:22:20 -------------------------------------------------------------------------------- Patient/Caregiver Education Details Patient Name: Date of Service: [name], Elizabeth N C. 1/21/2025andnbsp10:00 A M Medical Record Number: [id number] Patient Account Number: [id number] Date of Birth/Gender: Treating RN: 05/07/1956 (68 y.o. [name] Primary Care Physician: [name] Crutch Other Clinician: Betha Loa Referring  Physician: Treating Physician/Extender: Wynona Dove in Treatment: 104 Education Assessment Education Provided To: Patient Education Topics Provided Wound/Skin Impairment: Handouts: Other: continue to monitor for increased swelling and new open wounds Methods: Explain/Verbal Responses: State content correctly [name], [name] ([id number]) [id number] Page 7 of 7 Electronic Signature(s) Signed: 07/30/2023 5:15:40 PM By: Betha Loa Entered By: Betha Loa on 07/30/2023 10:38:53 -------------------------------------------------------------------------------- Vitals Details Patient Name: Date of Service: [name]. 07/30/2023 10:00 A M Medical Record Number: [id number] Patient Account Number: [id number] Date of Birth/Sex: Treating RN: [birth year] (68 y.o. [name] Primary Care Camron Monday: [name] Crutch Other Clinician: Betha Loa Referring Dejay Kronk: Treating Meeyah Ovitt/Extender: Wynona Dove in Treatment: 104 Vital Signs Time Taken: 10:18 Temperature (F): 97.9 Height (in): 63 Pulse (bpm): 86 Weight (lbs): 372 Respiratory Rate (breaths/min): 18 Body Mass Index (BMI): 65.9 Blood Pressure (mmHg): 116/81 Reference Range: 80 - 120 mg / dl Electronic Signature(s) Signed: 07/30/2023 5:15:40 PM By: Betha Loa Entered By: Betha Loa on 07/30/2023 10:22:13

## 2023-07-30 NOTE — Patient Instructions (Addendum)
DISCONTINUE WEGOVY 1 MG  START WEGOVY 1.7 MG ONCE WEEKLY  It was great to see you today, stay warm!!

## 2023-08-15 ENCOUNTER — Ambulatory Visit: Payer: Medicare HMO

## 2023-08-15 DIAGNOSIS — Z0000 Encounter for general adult medical examination without abnormal findings: Secondary | ICD-10-CM

## 2023-08-15 NOTE — Progress Notes (Signed)
 Appointment Outcome: Completed, Session #: 3                         Start time: 3:02pm   End time: 3:38pm   Total Mins: 36 minutes  AGREEMENTS SECTION   Agreement/Action Steps:  Improve healthy eating behaviors Meal planning for 2-3 days at a time Practice portion control by using the diabetic plate method Eat approximately every 3 hours to help with practicing portion control better and ensuring that she keep her blood glucose stable.  Take Wegovy  as prescribed Read food label to monitor sodium and calories Aim to consume 1500 mg of sodium per day Increase water intake to approximately 40 oz of water per day and limit total intake of fluid to 64 oz as recommended by her provider   Increase physical activity Perform former physical therapy stretches/exercises Follow a YouTube chair yoga video Track increments of physical activity to calculate total amount of time engaged during the day    Progress Notes:  Patient has been able to implement her action plan to improve her eating habits by maintaining meal planning and prepping for 2-3 days at a time. Patient shared examples of meals that she has prepared over the past two weeks that she alternate days eating to avoid getting bored with her meal options. Patient stated that this has helped to have food prepared when she needs to eat to stabilize her blood glucose. Patient stated that she still must remind herself to eat to avoid her glucose levels from dropping since she is taking Wegovy  as prescribed.   Patient stated that increasing her physical activity has increased incidents of feeling dizzy and needing to eat. Patient described how she is not able to eat large portions of food and how it has minimized the consumption of sweets. Patient shared how she is practicing mindful eating and not denying herself certain foods so that these habits are sustainable. Patient stated that she is practicing self-talk to guide her current behaviors to  stay on track.   Patient shared that she has been increasing her physical activity by performing a type of exercise at least every other hour for approximately 15 minutes. Patient stated that she is incorporating former PT exercises and stretches. Patient mentioned that she has not followed a YouTube video for chair yoga. Patient stated that she has tried that before and was not able to get into a routine.   Patient shared that when she gets bored, she does not sit and think about what she can eat as she did before, but instead she does an exercise. Patient mentioned that she was being purposeful after moving more throughout the day so that she can increase her heart rate. Patient stated that she has been engaged in this routine since 08/13/23 and plans on increasing her reps per exercise to continue working in increasing her heart rate to aid in weight loss.   Patient described the change in her relationship with food. Patient expressed that she was no longer eating because she loves food, but rather for energy and not to cover emotions. Patient shared that she feels empowered and that she is putting herself first.    Indicators of Success and Accountability:  Patient has increased her physical activity by engaging in exercises every other hour for approximately 15 minutes at a time to get her heart rate up with about 75 minutes of physical activity daily.  Readiness: Patient is in the action  stage of improving healthy eating behaviors and increasing physical activity.  Strengths and Supports: Patient is being supported by her son. Patient is relying on being mindful and intentional.  Challenges and Barriers: Patient has a challenge with not being hungry and having to remind herself to eat to avoid her glucose levels from dropping to low.   Coaching Outcomes: Patient will continue to implement her action steps as outlined above over the next two weeks with the focus on engaging in physical  activity on average 75 minutes per day. Patient has not started using YouTube videos, so we will revisit this step during the next session to determine if this is feasible for the patient.  Will discuss with patient during next session the following steps: reading food labels, monitoring sodium intake or water consumption due to patient's focus on increasing physical activity.

## 2023-08-27 ENCOUNTER — Encounter: Payer: Medicare Other | Attending: Physician Assistant | Admitting: Physician Assistant

## 2023-08-27 DIAGNOSIS — L97811 Non-pressure chronic ulcer of other part of right lower leg limited to breakdown of skin: Secondary | ICD-10-CM | POA: Insufficient documentation

## 2023-08-27 DIAGNOSIS — Q820 Hereditary lymphedema: Secondary | ICD-10-CM | POA: Insufficient documentation

## 2023-08-27 DIAGNOSIS — L97821 Non-pressure chronic ulcer of other part of left lower leg limited to breakdown of skin: Secondary | ICD-10-CM | POA: Diagnosis not present

## 2023-08-29 ENCOUNTER — Ambulatory Visit: Payer: Medicare Other

## 2023-08-29 DIAGNOSIS — Z0000 Encounter for general adult medical examination without abnormal findings: Secondary | ICD-10-CM

## 2023-08-29 NOTE — Progress Notes (Signed)
 Appointment Outcome: Completed, Session #: 4                        Start time: 3:15pm   End time: 3:50pm   Total Mins:35 minutes  AGREEMENTS SECTION   Overall Goal(s): Improve healthy eating behaviors and increase physical activity over the next 3 months to aid in weight loss and stabilize glucose levels.                                                    Agreement/Action Steps:  Improve healthy eating behaviors Meal planning for 2-3 days at a time Practice portion control by using the diabetic plate method Eat approximately every 3 hours to help with practicing portion control better and ensuring that she keep her blood glucose stable.  Take ZOXWRU as prescribed Read food label to monitor sodium and calories Aim to consume 1500 mg of sodium per day Increase water intake to approximately 40 oz of water per day and limit total intake of fluid to 64 oz as recommended by her provider   Increase physical activity Perform former physical therapy stretches/exercises Follow a YouTube chair yoga video Track increments of physical activity to calculate total amount of time engaged during the day    Progress Notes:  Patient continues to take [facility] as prescribed.  Patient is meal planning but is not able to eat what she has prepared at times due to lack of appetite or food aversion.  Patient reports not always using the diabetic plate method to practice portion control because she goes 2-3 days without eating vegetables. Patient uses 8" Styrofoam plate.  Patient reports drinking 3-18 oz cups of plain water in addition to coffee and juice at least 4 out of 7 days. Patient stated that on the other days that she does not drink 40 oz of water, she drinks more Ginger Ale.  Patient has been performing leg lifts, squats holding onto the counter, and 4-5 different chair exercises for the upper body.   Coaching Outcomes: Patient did not revise her health coaching agreement during this  appointment.  Patient will continue to work towards implementing her plan and will talk with her provider about the experience with food aversion and changes in blood glucose due to lack of appetite and not eating at time.    Attempted: Fulfilled - Patient completed the bi-weekly agreement in full and was able to meet the challenge.   Referrals: N/A

## 2023-09-11 ENCOUNTER — Other Ambulatory Visit: Payer: Self-pay

## 2023-09-11 MED ORDER — DAPAGLIFLOZIN PROPANEDIOL 10 MG PO TABS: 10.0000 mg | ORAL_TABLET | Freq: Every day | ORAL | 3 refills | Status: DC

## 2023-09-12 ENCOUNTER — Ambulatory Visit: Payer: Medicare Other | Attending: Cardiology

## 2023-09-12 DIAGNOSIS — Z0000 Encounter for general adult medical examination without abnormal findings: Secondary | ICD-10-CM

## 2023-09-12 NOTE — Progress Notes (Signed)
 Appointment Outcome:  Completed, Session #: 5                        Start time: 3:02pm   End time: 3:30pm   Total Mins: 28 minutes  AGREEMENTS SECTION   Overall Goal(s): Improve healthy eating behaviors and increase physical activity over the next 3 months to aid in weight loss and stabilize glucose levels.                                                     Agreement/Action Steps:  Improve healthy eating behaviors Meal planning for 2-3 days at a time Practice portion control by using the diabetic plate method Eat approximately every 3 hours to help with practicing portion control better and ensuring that she keep her blood glucose stable.  Take ONGEXB as prescribed Read food label to monitor sodium and calories Aim to consume 1500 mg of sodium per day Increase water intake to approximately 40 oz of water per day and limit total intake of fluid to 64 oz as recommended by her provider   Increase physical activity Perform former physical therapy stretches/exercises Follow a YouTube chair yoga video Track increments of physical activity to calculate total amount of time engaged during the day   Progress Notes:  Patient's focus during this appointment was on her progress with her eating habits.   Patient reported that she observed her eating habits more over the past two weeks to understand her food aversion and how she could address it.  Patient stated that the food aversion she experiences is worse during the first 2-3 days following her Wegovy shot (1.7 mg).  Patient has been reminding herself that she must eat at least something small, so her glucose doesn't drop too low.  Patient has noticed a disconnect at times when her body let's her know that a small amount of food has satisfied her, but her mind tells her that she still needs more food to eat.  Patient has noticed what she considers a small portion, she cannot eat half of that, so she is in a mental battle having to tell  herself not to force eat all the food because she doesn't want to waste it. Patient also stated that she talks to herself during these times because she doesn't like the feeling of being too full.  Patient continues to meal plan for 2-3 days at a time, has restarted using the diabetic plate method to focus on consumption of more green vegetables.  Patient is monitoring her sodium intake by reading food labels, rinsing canned goods, and not adding salt to food after it has been prepared.   Patient has had difficulty with consuming 40 oz of plain water over the past two weeks. Patient stated that she drinks some water in the morning when taking her medicine, but drinks juice, and a few sips of coffee, but doesn't get thirsty until nighttime. Patient stated that's when she will drink 16 oz of water. Patient is not sure what has decreased her desire to drink any fluids.    Coaching Outcomes: Since patient's focus was on her progress with her eating habits, we did not have time to discuss progress with physical activity and will visit that during the next appointment.  Patient did not revise her health  coaching agreement/action steps to implement over the next two weeks as outlined above.    Attempted: Fulfilled - Patient was able to continue meal planning, practice portion control, take [facility] as prescribed, read food labels, and monitor sodium intake.  Partial - Patient was not able to meet her goal of consuming 40 oz of water per day by 50% daily.  Not met - patient did not attempt the agreement and did not express concern   Not attempted: Deferred - Physical activity will be discussed in next health coaching appointment.  Referrals: N/A  Resources: N/A

## 2023-09-16 ENCOUNTER — Telehealth: Payer: Self-pay | Admitting: Family

## 2023-09-16 NOTE — Progress Notes (Unsigned)
 Advanced Heart Failure Clinic Note     PCP: [facility] (last seen 08/24) Primary Cardiologist: Marcina Millard, MD/ Leanora Ivanoff, [place] (last seen 10/24)  Chief Complaint: lymphedema worsening  HPI:  [name] is a 68 y/o female with a history of asthma, HTN, arthritis, lymphedema, CKD, secondary hyperparathyroidism, sleep apnea, anemia, spinal stenosis, obesity, previous tobacco use and chronic heart failure.   Admitted 05/10/22 due to worsening edema and drainage. Patient evaluated and found to have cellulitis of the right lower extremity. Had AKI. Vascular and wound consults obtained. Antibiotics started. Became symptomatic with elevated lactic acid and WBC. IVF given and antibiotic changed. CT scan 11/7 did not show any drainable fluid collection. Discharged after 6 days.   Echo 04/17/18: EF 65% with PA pressure of 43 mmHg Echo 04/13/19: EF 55-60% with mild LAE and trace MR Echo 11/01/21: EF of 60-65%.   She presents today for a HF follow-up visit with a chief complaint of lymphedema (worsening). Has occasional palpitations and minimal fatigue along with this. Denies shortness of breath, chest pain, cough, palpitations, abdominal distention, dizziness or difficulty sleeping. She has been eating some salted cured meat (similar to bacon) but tried to boil it first to get some of the salt out. She feels like her legs are a little more tighter than they had been. She is due to take her twice weekly metolazone when she gets home today.   She is supposed to do her wegovy injection later this week but says that she received a letter from her insurance company that it needed a PA as it was no longer on their formulary.   ROS: All systems negative except as listed in HPI, PMH and Problem List.  SH:  Social History   Socioeconomic History   Marital status: Single    Spouse name: Not on file   Number of children: Not on file   Years of education: Not on file   Highest education level:  Not on file  Occupational History   Not on file  Tobacco Use   Smoking status: Former    Current packs/day: 0.00    Average packs/day: 0.3 packs/day for 14.0 years (3.5 ttl pk-yrs)    Types: Cigarettes    Start date: 07/09/1976    Quit date: 07/09/1990    Years since quitting: 33.2   Smokeless tobacco: Current    Types: Snuff  Vaping Use   Vaping status: Never Used  Substance and Sexual Activity   Alcohol use: No   Drug use: No   Sexual activity: Not Currently  Other Topics Concern   Not on file  Social History Narrative   Not on file   Social Drivers of Health   Financial Resource Strain: Not on file  Food Insecurity: No Food Insecurity (09/06/2022)   [facility]   Hunger Vital Sign    Worried About Running Out of Food in the Last Year: Never true    Ran Out of Food in the Last Year: Never true  Transportation Needs: No Transportation Needs (09/06/2022)   [facility]   [facility] - Transportation    Lack of Transportation (Medical): No    Lack of Transportation (Non-Medical): No  Physical Activity: Not on file  Stress: Not on file  Social Connections: Not on file  Intimate Partner Violence: Not At Risk (05/10/2022)   Humiliation, Afraid, Rape, and Kick questionnaire    Fear of Current or Ex-Partner: No  Emotionally Abused: No    Physically Abused: No    Sexually Abused: No    FH:  Family History  Problem Relation Age of Onset   Breast cancer Maternal Aunt    Thyroid disease Other     Past Medical History:  Diagnosis Date   Arthritis    Asthma    CHF (congestive heart failure) (HCC)    Enlarged heart    Hypertension    Lymphedema    Sleep apnea    Spinal stenosis     Current Outpatient Medications  Medication Sig Dispense Refill   acetaminophen (TYLENOL) 500 MG tablet Take 500-1,000 mg by mouth every 6 (six) hours as needed for mild pain or fever.     albuterol (VENTOLIN HFA)  108 (90 Base) MCG/ACT inhaler Inhale 1-2 puffs into the lungs every 6 (six) hours as needed for wheezing or shortness of breath.     bumetanide (BUMEX) 2 MG tablet Take 1 tablet (2 mg total) by mouth daily. Stop Furosemide 30 tablet 4   calcitRIOL (ROCALTROL) 0.25 MCG capsule Take 0.25 mcg by mouth daily.     cetirizine (ZYRTEC) 10 MG tablet Take 1 tablet (10 mg total) by mouth daily. (Patient taking differently: Take 10 mg by mouth daily as needed for allergies.) 60 tablet 0   dapagliflozin propanediol (FARXIGA) 10 MG TABS tablet Take 1 tablet (10 mg total) by mouth daily before breakfast. 90 tablet 3   KLOR-CON M20 20 MEQ tablet Take 2 tablets (40 mEq total) by mouth daily. 180 tablet 2   metolazone (ZAROXOLYN) 5 MG tablet Take 0.5 tablets (2.5 mg total) by mouth 2 (two) times a week. 12 tablet 2   mupirocin ointment (BACTROBAN) 2 % Apply 1 Application topically daily as needed (wound care).     NYSTATIN powder Apply 1 Application topically 2 (two) times daily as needed (Yeast Skin infection).     Semaglutide-Weight Management (WEGOVY) 1.7 MG/0.75ML SOAJ Inject 1.7 mg into the skin once a week. 3 mL 2   senna-docusate (SENOKOT-S) 8.6-50 MG tablet Take 1 tablet by mouth 2 (two) times daily as needed for mild constipation or moderate constipation.     spironolactone (ALDACTONE) 25 MG tablet TAKE 0.5 TABLET BY MOUTH ONCE DAILY. 15 tablet 6   No current facility-administered medications for this visit.   Vitals:   09/17/23 1137  BP: 110/75  Pulse: (!) 106  SpO2: 94%  Weight: (!) 327 lb 12.8 oz (148.7 kg)   Wt Readings from Last 3 Encounters:  09/17/23 (!) 327 lb 12.8 oz (148.7 kg)  07/30/23 (!) 322 lb 4 oz (146.2 kg)  06/18/23 (!) 328 lb 8 oz (149 kg)   Lab Results  Component Value Date   CREATININE 1.81 (H) 08/31/2022   CREATININE 1.28 (H) 08/03/2022   CREATININE 1.32 (H) 07/23/2022    PHYSICAL EXAM:  General: Well appearing. No resp difficulty HEENT: normal Neck: supple, no  JVD Cor: Regular rhythm, tachycardic (HR 92-106). No rubs, gallops or murmurs Lungs: clear Abdomen: soft, nontender, nondistended. Extremities: no cyanosis, clubbing, rash, lymphedema Neuro: alert & oriented X 3. Moves all 4 extremities w/o difficulty. Affect pleasant   ECG: not done   ASSESSMENT & PLAN:  1: NICM with preserved ejection fraction- - likely HTN/ sleep apnea - NYHA class II - euvolemic today - weight up 5 pounds from last visit here 1 month ago - Echo 04/17/18: EF 65% with PA pressure of 43 mmHg - Echo 04/13/19: EF 55-60% with mild  LAE and trace MR - Echo 11/01/21: EF of 60-65%. - reviewed the importance of keeping her sodium 2000mg  as well as keeping daily fluid intake to 60-64 ounces/ day; reviewed not eating saltier foods like the salted meat she had been eating - facial/mouth swelling with ACEi - continue bumex 2mg  QD - continue farxiga 10mg  QD - continue potassium daily - continue metolazone 2.5mg  twice weekly - continue spironolactone 12.5mg  daily - saw cardiology Mellissa Kohut) 10/24 - sees nephrology later this week and will be having lab work drawn - BNP 10/31/21 was 123.9  2: HTN with stage 3 CKD- - BP 110/75 (has not taken meds yet today) - sees PCP Jesse Sans) @ [facility]  - saw nephrology Cherylann Ratel) 11/24; returns later this week & will be having lab work done - [facility] 05/16/23 reviewed and showed sodium 137, potassium 3.9, creatinine 1.6 and GFR 35  3: Lymphedema- - saw wound center 02/25 - due to take metolazone today  4: Sleep apnea- - not wearing CPAP nightly due to having water in the mask; insurance would not allow another CPAP machine until 2025 so she has to look into this - saw pulmonology Welton Flakes) 04/24  5: Morbid obesity- - BMI today is 58.07 which is up some - wegovy no longer covered by insurance; pharmD checked and ozempic, mounjaro and trulicity are covered - RX for ozempic 2mg  weekly sent in since she was up to the 1.7mg  dose of  wegovy - unable to attend [place] pharmacy clinic due to transportation issues   Return in 1 month, sooner if needed.   Delma Freeze, FNP 09/16/23

## 2023-09-16 NOTE — Telephone Encounter (Signed)
 Pt confirmed appt on 09/17/23

## 2023-09-17 ENCOUNTER — Encounter: Payer: Self-pay | Admitting: Family

## 2023-09-17 ENCOUNTER — Ambulatory Visit: Payer: Medicare HMO | Attending: Family | Admitting: Family

## 2023-09-17 ENCOUNTER — Telehealth: Payer: Self-pay

## 2023-09-17 ENCOUNTER — Other Ambulatory Visit (HOSPITAL_COMMUNITY): Payer: Self-pay

## 2023-09-17 VITALS — BP 110/75 | HR 106 | Wt 327.8 lb

## 2023-09-17 DIAGNOSIS — Z87891 Personal history of nicotine dependence: Secondary | ICD-10-CM | POA: Diagnosis not present

## 2023-09-17 DIAGNOSIS — N183 Chronic kidney disease, stage 3 unspecified: Secondary | ICD-10-CM | POA: Insufficient documentation

## 2023-09-17 DIAGNOSIS — I428 Other cardiomyopathies: Secondary | ICD-10-CM | POA: Diagnosis not present

## 2023-09-17 DIAGNOSIS — Z6841 Body Mass Index (BMI) 40.0 and over, adult: Secondary | ICD-10-CM | POA: Diagnosis not present

## 2023-09-17 DIAGNOSIS — N2581 Secondary hyperparathyroidism of renal origin: Secondary | ICD-10-CM | POA: Insufficient documentation

## 2023-09-17 DIAGNOSIS — I130 Hypertensive heart and chronic kidney disease with heart failure and stage 1 through stage 4 chronic kidney disease, or unspecified chronic kidney disease: Secondary | ICD-10-CM | POA: Insufficient documentation

## 2023-09-17 DIAGNOSIS — G4733 Obstructive sleep apnea (adult) (pediatric): Secondary | ICD-10-CM

## 2023-09-17 DIAGNOSIS — J45909 Unspecified asthma, uncomplicated: Secondary | ICD-10-CM | POA: Insufficient documentation

## 2023-09-17 DIAGNOSIS — I890 Lymphedema, not elsewhere classified: Secondary | ICD-10-CM | POA: Insufficient documentation

## 2023-09-17 DIAGNOSIS — D631 Anemia in chronic kidney disease: Secondary | ICD-10-CM | POA: Diagnosis not present

## 2023-09-17 DIAGNOSIS — I5032 Chronic diastolic (congestive) heart failure: Secondary | ICD-10-CM | POA: Insufficient documentation

## 2023-09-17 DIAGNOSIS — M4800 Spinal stenosis, site unspecified: Secondary | ICD-10-CM | POA: Diagnosis not present

## 2023-09-17 DIAGNOSIS — I10 Essential (primary) hypertension: Secondary | ICD-10-CM

## 2023-09-17 DIAGNOSIS — M1990 Unspecified osteoarthritis, unspecified site: Secondary | ICD-10-CM | POA: Diagnosis not present

## 2023-09-17 MED ORDER — OZEMPIC (1 MG/DOSE) 4 MG/3ML ~~LOC~~ SOPN: 1.0000 mg | PEN_INJECTOR | SUBCUTANEOUS | 1 refills | Status: DC

## 2023-09-17 MED ORDER — OZEMPIC (2 MG/DOSE) 8 MG/3ML ~~LOC~~ SOPN: 2.0000 mg | PEN_INJECTOR | SUBCUTANEOUS | 1 refills | Status: DC

## 2023-09-17 NOTE — Telephone Encounter (Signed)
 Patient Advocate Encounter  Test billing for GLP1 products returned the following results:  Mounjaro $0 Ozempic $0 Saxenda not covered Trulicity $0 Wegovy not covered Zepbound not covered  Burnell Blanks, CPhT Rx Patient Advocate Phone: [phone]

## 2023-09-25 ENCOUNTER — Ambulatory Visit (HOSPITAL_BASED_OUTPATIENT_CLINIC_OR_DEPARTMENT_OTHER)

## 2023-09-25 DIAGNOSIS — Z0000 Encounter for general adult medical examination without abnormal findings: Secondary | ICD-10-CM

## 2023-09-25 NOTE — Progress Notes (Signed)
 Appointment Outcome: Completed, Session #: 6                        Start time: 3:03pm   End time: 3:33pm   Total Mins: 30 minutes  AGREEMENTS SECTION   Overall Goal(s): Improve healthy eating behaviors and increase physical activity over the next 3 months to aid in weight loss and stabilize glucose levels.                                                     Agreement/Action Steps:  Improve healthy eating behaviors Meal planning for 2-3 days at a time Practice portion control by using the diabetic plate method Eat approximately every 3 hours to help with practicing portion control better and ensuring that she keep her blood glucose stable.  Take WGNFAO as prescribed Read food label to monitor sodium and calories Aim to consume 1500 mg of sodium per day Increase water intake to approximately 40 oz of water per day and limit total intake of fluid to 64 oz as recommended by her provider   Increase physical activity Perform former physical therapy stretches/exercises Follow a YouTube chair yoga video Track increments of physical activity to calculate total amount of time engaged during the day    Progress Notes:  Patient reports being able to eat better now that she has switched to taking Ozempic and not experiencing nausea after her shots starting on the 17th.  Patient mentioned that she is still having to remind herself to eat at times because she does not think about food like she used too. Patient mentioned this medication makes her feel less hungry.  Patient shared that she continues to work on practicing portion control when eating by listening to her stomach, not eating till full, and not listening to her mind about how good the food taste. Patient mentioned that she has been able to cut back on the amount of food that she prepares at one time for a meal as well to portion out for several small meals.  Patient continues to meal prep as planned. Patient stated that she focuses on  ensuring that dinner is well-balanced including vegetables.   Patient stated that she has continued to read food labels and if she does consume something higher in sodium than she intends, she keeps that in mind to make healthier choices later.   Patient has begun consuming more than 40 ounces of plain water than planned and will cut back to maintain balance of fluid intake.  Patient continues to perform previous physical therapy stretches/exercises for upper body.  Patient continues to do leg lifts/swings when holding onto the counter or standing. Patient mentioned that she walks around in her home for extra movement as well. Patient stated that at times if she is busy cleaning/taking the trash out or caring for her cat, it is a distraction from eating.     Coaching Outcomes: Patient will continue to implement her action steps over the next month to ensure that she can maintain her health coaching agreement/action steps while taking new medication.  Patient has completed her 6 biweekly sessions and is scheduled for a final 66-month f/u.  Patient stated that her current goal is to work towards losing weight and being under 300 pounds.   Patient will be mindful  of her fluid intake to reduce fluid retention.     Attempted: Fulfilled - Patient completed the bi-weekly agreement in full and was able to meet the challenge.

## 2023-10-01 ENCOUNTER — Encounter: Payer: Medicare HMO | Attending: Physician Assistant | Admitting: Physician Assistant

## 2023-10-01 DIAGNOSIS — L97821 Non-pressure chronic ulcer of other part of left lower leg limited to breakdown of skin: Secondary | ICD-10-CM | POA: Insufficient documentation

## 2023-10-01 DIAGNOSIS — Q820 Hereditary lymphedema: Secondary | ICD-10-CM | POA: Insufficient documentation

## 2023-10-01 DIAGNOSIS — L97811 Non-pressure chronic ulcer of other part of right lower leg limited to breakdown of skin: Secondary | ICD-10-CM | POA: Diagnosis not present

## 2023-10-21 ENCOUNTER — Telehealth: Payer: Self-pay | Admitting: Family

## 2023-10-21 NOTE — Telephone Encounter (Incomplete)
 Called to confirm/remind patient of their appointment at the Advanced Heart Failure Clinic on 10/22/23.   Appointment:   [x] Confirmed  [] Left mess   [] No answer/No voice mail  [] Phone not in service  Patient reminded to bring all medications and/or complete list.  Confirmed patient has transportation. Gave directions, instructed to utilize valet parking.

## 2023-10-21 NOTE — Progress Notes (Unsigned)
 Advanced Heart Failure Clinic Note     PCP: [name] (last seen 08/24) Primary Cardiologist: Marcina Millard, MD/ Leanora Ivanoff, [place] (last seen 10/24)  Chief Complaint: lymphedema worsening  HPI:  [name] is a 68 y/o female with a history of asthma, HTN, arthritis, lymphedema, CKD, secondary hyperparathyroidism, sleep apnea, anemia, spinal stenosis, obesity, previous tobacco use and chronic heart failure.   Admitted 05/10/22 due to worsening edema and drainage. Patient evaluated and found to have cellulitis of the right lower extremity. Had AKI. Vascular and wound consults obtained. Antibiotics started. Became symptomatic with elevated lactic acid and WBC. IVF given and antibiotic changed. CT scan 11/7 did not show any drainable fluid collection. Discharged after 6 days.   Echo 04/17/18: EF 65% with PA pressure of 43 mmHg Echo 04/13/19: EF 55-60% with mild LAE and trace MR Echo 11/01/21: EF of 60-65%.   She presents today for a HF follow-up visit with a chief complaint of lymphedema (worsening). Has occasional palpitations and minimal fatigue along with this. Denies shortness of breath, chest pain, cough, palpitations, abdominal distention, dizziness or difficulty sleeping. She has been eating some salted cured meat (similar to bacon) but tried to boil it first to get some of the salt out. She feels like her legs are a little more tighter than they had been. She is due to take her twice weekly metolazone when she gets home today.   She is supposed to do her wegovy injection later this week but says that she received a letter from her insurance company that it needed a PA as it was no longer on their formulary.   ROS: All systems negative except as listed in HPI, PMH and Problem List.  SH:  Social History   Socioeconomic History   Marital status: Single    Spouse name: Not on file   Number of children: Not on file   Years of education: Not on file   Highest education level:  Not on file  Occupational History   Not on file  Tobacco Use   Smoking status: Former    Current packs/day: 0.00    Average packs/day: 0.3 packs/day for 14.0 years (3.5 ttl pk-yrs)    Types: Cigarettes    Start date: 07/09/1976    Quit date: 07/09/1990    Years since quitting: 33.3   Smokeless tobacco: Current    Types: Snuff  Vaping Use   Vaping status: Never Used  Substance and Sexual Activity   Alcohol use: No   Drug use: No   Sexual activity: Not Currently  Other Topics Concern   Not on file  Social History Narrative   Not on file   Social Drivers of Health   Financial Resource Strain: Not on file  Food Insecurity: No Food Insecurity (09/06/2022)   [facility]   Hunger Vital Sign    Worried About Running Out of Food in the Last Year: Never true    Ran Out of Food in the Last Year: Never true  Transportation Needs: No Transportation Needs (09/06/2022)   [facility]   [facility] - Transportation    Lack of Transportation (Medical): No    Lack of Transportation (Non-Medical): No  Physical Activity: Not on file  Stress: Not on file  Social Connections: Not on file  Intimate Partner Violence: Not At Risk (05/10/2022)   Humiliation, Afraid, Rape, and Kick questionnaire    Fear of Current or Ex-Partner: No  Emotionally Abused: No    Physically Abused: No    Sexually Abused: No    FH:  Family History  Problem Relation Age of Onset   Breast cancer Maternal Aunt    Thyroid disease Other     Past Medical History:  Diagnosis Date   Arthritis    Asthma    CHF (congestive heart failure) (HCC)    Enlarged heart    Hypertension    Lymphedema    Sleep apnea    Spinal stenosis     Current Outpatient Medications  Medication Sig Dispense Refill   acetaminophen (TYLENOL) 500 MG tablet Take 500-1,000 mg by mouth every 6 (six) hours as needed for mild pain or fever.     albuterol (VENTOLIN HFA)  108 (90 Base) MCG/ACT inhaler Inhale 1-2 puffs into the lungs every 6 (six) hours as needed for wheezing or shortness of breath.     bumetanide (BUMEX) 2 MG tablet Take 1 tablet (2 mg total) by mouth daily. Stop Furosemide 30 tablet 4   calcitRIOL (ROCALTROL) 0.25 MCG capsule Take 0.25 mcg by mouth daily.     cetirizine (ZYRTEC) 10 MG tablet Take 1 tablet (10 mg total) by mouth daily. (Patient taking differently: Take 10 mg by mouth daily as needed for allergies.) 60 tablet 0   Cyanocobalamin (VITAMIN B 12) 500 MCG TABS Take 500 mg by mouth daily.     dapagliflozin propanediol (FARXIGA) 10 MG TABS tablet Take 1 tablet (10 mg total) by mouth daily before breakfast. 90 tablet 3   KLOR-CON M20 20 MEQ tablet Take 2 tablets (40 mEq total) by mouth daily. 180 tablet 2   metolazone (ZAROXOLYN) 5 MG tablet Take 0.5 tablets (2.5 mg total) by mouth 2 (two) times a week. 12 tablet 2   mupirocin ointment (BACTROBAN) 2 % Apply 1 Application topically daily as needed (wound care).     NYSTATIN powder Apply 1 Application topically 2 (two) times daily as needed (Yeast Skin infection).     Semaglutide, 2 MG/DOSE, (OZEMPIC, 2 MG/DOSE,) 8 MG/3ML SOPN Inject 2 mg into the skin once a week. 3 mL 1   senna-docusate (SENOKOT-S) 8.6-50 MG tablet Take 1 tablet by mouth 2 (two) times daily as needed for mild constipation or moderate constipation.     spironolactone (ALDACTONE) 25 MG tablet TAKE 0.5 TABLET BY MOUTH ONCE DAILY. 15 tablet 6   No current facility-administered medications for this visit.   There were no vitals filed for this visit.  Wt Readings from Last 3 Encounters:  09/17/23 (!) 327 lb 12.8 oz (148.7 kg)  07/30/23 (!) 322 lb 4 oz (146.2 kg)  06/18/23 (!) 328 lb 8 oz (149 kg)   Lab Results  Component Value Date   CREATININE 1.81 (H) 08/31/2022   CREATININE 1.28 (H) 08/03/2022   CREATININE 1.32 (H) 07/23/2022    PHYSICAL EXAM:  General: Well appearing. No resp difficulty HEENT: normal Neck:  supple, no JVD Cor: Regular rhythm, tachycardic (HR 92-106). No rubs, gallops or murmurs Lungs: clear Abdomen: soft, nontender, nondistended. Extremities: no cyanosis, clubbing, rash, lymphedema Neuro: alert & oriented X 3. Moves all 4 extremities w/o difficulty. Affect pleasant   ECG: not done   ASSESSMENT & PLAN:  1: NICM with preserved ejection fraction- - likely HTN/ sleep apnea - NYHA class II - euvolemic today - weight up 5 pounds from last visit here 1 month ago - Echo 04/17/18: EF 65% with PA pressure of 43 mmHg - Echo 04/13/19: EF  55-60% with mild LAE and trace MR - Echo 11/01/21: EF of 60-65%. - reviewed the importance of keeping her sodium 2000mg  as well as keeping daily fluid intake to 60-64 ounces/ day; reviewed not eating saltier foods like the salted meat she had been eating - facial/mouth swelling with ACEi - continue bumex 2mg  QD - continue farxiga 10mg  QD - continue potassium 40meq daily - continue metolazone 2.5mg  twice weekly - continue spironolactone 12.5mg  daily - saw cardiology Anthonette Bastos) 10/24 - sees nephrology later this week and will be having lab work drawn - BNP 10/31/21 was 123.9  2: HTN with stage 3 CKD- - BP 110/75 (has not taken meds yet today) - sees PCP [name]  - saw nephrology Rhesa Celeste) 11/24; returns later this week & will be having lab work done - [facility] 05/16/23 reviewed and showed sodium 137, potassium 3.9, creatinine 1.6 and GFR 35  3: Lymphedema- - saw wound center 02/25 - due to take metolazone today  4: Sleep apnea- - not wearing CPAP nightly due to having water in the mask; insurance would not allow another CPAP machine until 2025 so she has to look into this - saw pulmonology Meredeth Stallion) 04/24  5: Morbid obesity- - BMI today is 58.07 which is up some - wegovy no longer covered by insurance; pharmD checked and ozempic, mounjaro and trulicity are covered - RX for ozempic 2mg  weekly sent in since she was up to the 1.7mg   dose of wegovy - unable to attend [place] pharmacy clinic due to transportation issues   Return in 1 month, sooner if needed.   Charlette Console, FNP 10/21/23

## 2023-10-22 ENCOUNTER — Ambulatory Visit: Attending: Family | Admitting: Family

## 2023-10-22 ENCOUNTER — Encounter: Payer: Self-pay | Admitting: Family

## 2023-10-22 ENCOUNTER — Other Ambulatory Visit: Payer: Self-pay

## 2023-10-22 VITALS — BP 101/71 | HR 90 | Wt 319.0 lb

## 2023-10-22 DIAGNOSIS — I428 Other cardiomyopathies: Secondary | ICD-10-CM | POA: Diagnosis present

## 2023-10-22 DIAGNOSIS — I890 Lymphedema, not elsewhere classified: Secondary | ICD-10-CM | POA: Insufficient documentation

## 2023-10-22 DIAGNOSIS — G4730 Sleep apnea, unspecified: Secondary | ICD-10-CM | POA: Diagnosis not present

## 2023-10-22 DIAGNOSIS — E872 Acidosis, unspecified: Secondary | ICD-10-CM | POA: Insufficient documentation

## 2023-10-22 DIAGNOSIS — Z87891 Personal history of nicotine dependence: Secondary | ICD-10-CM | POA: Diagnosis not present

## 2023-10-22 DIAGNOSIS — I130 Hypertensive heart and chronic kidney disease with heart failure and stage 1 through stage 4 chronic kidney disease, or unspecified chronic kidney disease: Secondary | ICD-10-CM | POA: Insufficient documentation

## 2023-10-22 DIAGNOSIS — I10 Essential (primary) hypertension: Secondary | ICD-10-CM | POA: Diagnosis not present

## 2023-10-22 DIAGNOSIS — N183 Chronic kidney disease, stage 3 unspecified: Secondary | ICD-10-CM | POA: Insufficient documentation

## 2023-10-22 DIAGNOSIS — Z7984 Long term (current) use of oral hypoglycemic drugs: Secondary | ICD-10-CM | POA: Insufficient documentation

## 2023-10-22 DIAGNOSIS — I5032 Chronic diastolic (congestive) heart failure: Secondary | ICD-10-CM

## 2023-10-22 DIAGNOSIS — Z79899 Other long term (current) drug therapy: Secondary | ICD-10-CM | POA: Diagnosis not present

## 2023-10-22 DIAGNOSIS — N2581 Secondary hyperparathyroidism of renal origin: Secondary | ICD-10-CM | POA: Insufficient documentation

## 2023-10-22 DIAGNOSIS — R220 Localized swelling, mass and lump, head: Secondary | ICD-10-CM | POA: Diagnosis not present

## 2023-10-22 DIAGNOSIS — G4733 Obstructive sleep apnea (adult) (pediatric): Secondary | ICD-10-CM | POA: Diagnosis not present

## 2023-10-22 MED ORDER — OZEMPIC (2 MG/DOSE) 8 MG/3ML ~~LOC~~ SOPN: 2.0000 mg | PEN_INJECTOR | SUBCUTANEOUS | 5 refills | Status: DC

## 2023-10-22 NOTE — Progress Notes (Signed)
 [facility] - HEART FAILURE CLINIC - PHARMACIST COUNSELING NOTE  Adherence Assessment  Do you ever forget to take your medication? [] Yes [x] No  Do you ever skip doses due to side effects?  Ozempic on Thursdays Forgetting about 2x/week  [] Yes [x] No  Do you have trouble affording your medicines?  [] Yes [x] No  Are you ever unable to pick up your medication due to transportation difficulties? [] Yes [x] No  Do you ever stop taking your medications because you don't believe they are helping? [] Yes [x] No  Do you check your weight daily? Unable to check at home  [] Yes [x] No  Do you check your blood pressure daily? 90/70 [] Yes [x] No  Adherence strategy: Pill box; declined  Barriers to obtaining medications: None reported    Vital signs: HR ***, BP ***, weight (pounds) *** ECHO: Date ***, EF ***, notes *** Cath: Date ***, EF ***, notes *** Renal function: Date ***, GFR ***  Current Guideline-Directed Medical Therapy/Evidence Based Medicine  ACE/ARB/ARNI: {AR_ARNI/ACEi/ARB:25599} Target dose:  Beta Blocker: {AR_Beta blocker:25598} Target dose:  Aldosterone Antagonist: {AR_Mineralocorticoid receptor antagonists:25602} Target dose:  SGLT2i: {AR_SGLT2i:25603} Target dose:   Diuretic: {AR_Diuretics:25604}  Others:   ASSESSMENT *** year old {Gender Description:210950033} who presents to the HF clinic ***. PMH is significant for ***.   Recent ED visit or hospitalization (past 6 months): Date - ***, CC - *** , Admission Dx (if applicable) - ***    COUNSELING POINTS/CLINICAL PEARLS  Can use the HFCMEDCOUNSELING dot phrase here   PLAN    Time spent: *** minutes

## 2023-10-23 ENCOUNTER — Ambulatory Visit (HOSPITAL_BASED_OUTPATIENT_CLINIC_OR_DEPARTMENT_OTHER)

## 2023-10-24 ENCOUNTER — Telehealth (HOSPITAL_BASED_OUTPATIENT_CLINIC_OR_DEPARTMENT_OTHER): Payer: Self-pay | Admitting: Licensed Clinical Social Worker

## 2023-10-24 NOTE — Telephone Encounter (Signed)
 H&V Care Navigation CSW Progress Note  Clinical Social Worker contacted patient by phone to f/u on health coaching appt from 4/16. Left voicemail at [phone] pt returned. Explained staffing change and that we aren't able to offer health coaching services again at this time. Pt declines any additional referrals, feels prepared to continue healthy habits and will contact us  as needed. She shares with me continued weight loss- provided encouragement for her hard work and encouraged her to let our team or provider know if any additional questions arise or resources sought.   Patient is participating in a Managed Medicaid Plan:  No [employer] Medicare  SDOH Screenings   Food Insecurity: No Food Insecurity (09/06/2022)   [facility]  Housing: Low Risk  (04/03/2022)  Transportation Needs: No Transportation Needs (09/06/2022)   [facility]  Utilities: Not At Risk (05/10/2022)  Depression (PHQ2-9): Low Risk  (03/06/2022)  Tobacco Use: High Risk (10/22/2023)    Nathen Balder, MSW, LCSW Clinical Social Worker II [facility] Heart/Vascular Care Navigation  [phone]- work cell phone (preferred) [phone]- desk phone

## 2023-10-29 ENCOUNTER — Encounter: Attending: Physician Assistant | Admitting: Physician Assistant

## 2023-10-29 DIAGNOSIS — Q820 Hereditary lymphedema: Secondary | ICD-10-CM | POA: Diagnosis present

## 2023-10-29 DIAGNOSIS — L97821 Non-pressure chronic ulcer of other part of left lower leg limited to breakdown of skin: Secondary | ICD-10-CM | POA: Insufficient documentation

## 2023-10-29 DIAGNOSIS — L97811 Non-pressure chronic ulcer of other part of right lower leg limited to breakdown of skin: Secondary | ICD-10-CM | POA: Diagnosis not present

## 2023-11-26 ENCOUNTER — Encounter: Attending: Physician Assistant | Admitting: Physician Assistant

## 2023-11-26 DIAGNOSIS — L97821 Non-pressure chronic ulcer of other part of left lower leg limited to breakdown of skin: Secondary | ICD-10-CM | POA: Insufficient documentation

## 2023-11-26 DIAGNOSIS — Q820 Hereditary lymphedema: Secondary | ICD-10-CM | POA: Insufficient documentation

## 2023-11-26 DIAGNOSIS — L97811 Non-pressure chronic ulcer of other part of right lower leg limited to breakdown of skin: Secondary | ICD-10-CM | POA: Diagnosis not present

## 2023-12-13 ENCOUNTER — Other Ambulatory Visit: Payer: Self-pay | Admitting: Family

## 2023-12-16 ENCOUNTER — Emergency Department
Admission: EM | Admit: 2023-12-16 | Discharge: 2023-12-16 | Disposition: A | Discharge status: Home / Self Care | Attending: Emergency Medicine | Admitting: Emergency Medicine

## 2023-12-16 ENCOUNTER — Other Ambulatory Visit: Payer: Self-pay

## 2023-12-16 ENCOUNTER — Other Ambulatory Visit: Payer: Self-pay | Admitting: Family

## 2023-12-16 DIAGNOSIS — M5440 Lumbago with sciatica, unspecified side: Secondary | ICD-10-CM | POA: Diagnosis not present

## 2023-12-16 DIAGNOSIS — M545 Low back pain, unspecified: Secondary | ICD-10-CM | POA: Diagnosis present

## 2023-12-16 MED ORDER — PREDNISONE 50 MG PO TABS: 50.0000 mg | ORAL_TABLET | Freq: Every day | ORAL | 0 refills | Status: AC

## 2023-12-16 MED ORDER — LIDOCAINE 5 % EX PTCH
1.0000 | MEDICATED_PATCH | CUTANEOUS | Status: DC
  Administered 2023-12-16: 1 via TRANSDERMAL
  Filled 2023-12-16: qty 1

## 2023-12-16 MED ORDER — SPIRONOLACTONE 25 MG PO TABS: ORAL_TABLET | ORAL | 3 refills | Status: DC

## 2023-12-16 MED ORDER — OXYCODONE-ACETAMINOPHEN 5-325 MG PO TABS
1.0000 | ORAL_TABLET | Freq: Once | ORAL | Status: AC
  Administered 2023-12-16: 1 via ORAL
  Filled 2023-12-16: qty 1

## 2023-12-16 MED ORDER — PREDNISONE 20 MG PO TABS
50.0000 mg | ORAL_TABLET | Freq: Once | ORAL | Status: AC
  Administered 2023-12-16: 50 mg via ORAL
  Filled 2023-12-16: qty 3

## 2023-12-16 MED ORDER — OXYCODONE-ACETAMINOPHEN 5-325 MG PO TABS: 1.0000 | ORAL_TABLET | Freq: Four times a day (QID) | ORAL | 0 refills | Status: AC | PRN

## 2023-12-16 NOTE — ED Triage Notes (Signed)
 States having Lower back/ Right hip pain shooting down Legs. States she has been able to get up to limp to the bathroom but has progressed to not being able to move legs much due to pain. Denies injury, Does have hx of sciatica

## 2023-12-16 NOTE — Discharge Instructions (Addendum)
 You are seen in the emergency department with lower back pain.  You were given a prescription for a steroid and a pain medication.  It is important that you call your primary care physician today to schedule close follow-up appointment, you likely need repeat imaging of your lower back.  Return to the emergency department if you have worsening back pain, urinary or bowel incontinence, numbness or weakness of your legs or any worsening symptoms.  At that time you may need an emergent MRI of your lower back.  Pain control:  Acetaminophen  (tylenol ) - You can take 2 extra strength tablets (1000 mg) every 6 hours as needed for pain/fever.   You were given a prescription for narcotic pain medications.  Take only if in severe pain.  These are very addictive medications.  These medications can make you constipated.  If you need to take more than 1-2 doses, start a stool softner.  If you become constipated, take 1 capfull of MiraLAX , can repeat untill having regular bowel movements.  Keep this medication out of reach of any children.  Prednisone  -you are given a prescription for a steroid.  It is important that you take this medication with food.  This medication can cause an upset stomach.  It also can increase your glucose if you have a history of diabetes, so it is important that you check your glucose frequently while you are on this medication.

## 2023-12-16 NOTE — ED Notes (Signed)
 Visual merchandiser to set up transport back home for the patient.

## 2023-12-16 NOTE — ED Provider Notes (Signed)
 [facility] Provider Note    Event Date/Time   First MD Initiated Contact with Patient 12/16/23 1240     (approximate)   History   Back Pain and Hip Pain   HPI  [name] is a 68 y.o. female past medical history significant for obesity, chronic lower extremity wounds, who presents to the emergency department with back and hip pain.  Patient states that on Friday she developed lower back pain that was radiating to her hips.  States that it is radiating worse to her left hip but also to her right hip.  Denies any falls or trauma.  Normally ambulates with a cane.  Complaining of worsening pain that radiates down her right leg today.  Denies any urinary or bowel incontinence.  Denies any saddle anesthesia or numbness with wiping.  Felt like she was unable to lift her right leg up like she normally could today secondary to pain.  Has a history of lumbar stenosis remotely.  No prior back surgery.  Denies fever or chills.  No history of malignancy.  No diabetic history.  No recent NSAID use.     Physical Exam   Triage Vital Signs: ED Triage Vitals  Encounter Vitals Group     BP 12/16/23 1244 126/61     Systolic BP Percentile --      Diastolic BP Percentile --      Pulse Rate 12/16/23 1244 61     Resp 12/16/23 1244 18     Temp 12/16/23 1244 98.3 F (36.8 C)     Temp Source 12/16/23 1244 Oral     SpO2 12/16/23 1244 99 %     Weight --      Height --      Head Circumference --      Peak Flow --      Pain Score 12/16/23 1239 8     Pain Loc --      Pain Education --      Exclude from Growth Chart --     Most recent vital signs: Vitals:   12/16/23 1244 12/16/23 1430  BP: 126/61 107/72  Pulse: 61   Resp: 18   Temp: 98.3 F (36.8 C)   SpO2: 99%     Physical Exam Constitutional:      Appearance: She is well-developed. She is obese.  HENT:     Head: Atraumatic.  Eyes:     Conjunctiva/sclera: Conjunctivae normal.  Cardiovascular:     Rate  and Rhythm: Regular rhythm.  Pulmonary:     Effort: No respiratory distress.  Abdominal:     General: There is no distension.  Musculoskeletal:        General: Normal range of motion.     Cervical back: Normal range of motion.     Comments: Chronic wounds to the lower extremities.  Midline lumbar tenderness to palpation with.  No CVA tenderness.  Skin:    General: Skin is warm.  Neurological:     Mental Status: She is alert. Mental status is at baseline.     Comments: No saddle anesthesia.  Positive straight leg raise to the right lower extremity.  Good motor strength bilateral lower extremities.  +2 DP pulses that are equal bilaterally.  Sensation intact bilateral lower extremities.      IMPRESSION / MDM / ASSESSMENT AND PLAN / ED COURSE  I reviewed the triage vital signs and the nursing notes.  Differential diagnosis including sciatica, piriformis syndrome, radicular  pain.  Have low suspicion for fracture or dislocation given that the patient has not had any falls or trauma.  I have low suspicion for cauda equina or epidural compression syndrome given that the patient does not have any urinary or bowel incontinence, no lower extremity numbness, no saddle anesthesia, no new weakness.  Do not feel that the patient meets criteria for an emergent MRI.    Labs (all labs ordered are listed, but only abnormal results are displayed) Labs interpreted as -    Labs Reviewed - No data to display   Clinical Course as of 12/16/23 1511  Mon Dec 16, 2023  1320 Patient was given p.o. pain medication and Lidoderm  patch.  Placed on a pure wick.  Plan to get a post void residual. [SM]    Clinical Course User Index [SM] Viviano Ground, MD    No postvoid residual and only had 2 mL in the bladder.  No urinary or bowel incontinence.  Improvement of pain following pain medication.  Discussed close follow-up as an outpatient with her primary care physician and discussed return precautions for any  ongoing or worsening symptoms.  Will do a short course of pain medication and steroids given that she is otherwise limited of NSAID use.  Discussed Tylenol  for pain control.  PROCEDURES:  Critical Care performed: No  Procedures  Patient's presentation is most consistent with acute presentation with potential threat to life or bodily function.   MEDICATIONS ORDERED IN ED: Medications  lidocaine  (LIDODERM ) 5 % 1 patch (1 patch Transdermal Patch Applied 12/16/23 1309)  oxyCODONE -acetaminophen  (PERCOCET/ROXICET) 5-325 MG per tablet 1 tablet (1 tablet Oral Given 12/16/23 1309)  predniSONE  (DELTASONE ) tablet 50 mg (50 mg Oral Given 12/16/23 1309)    FINAL CLINICAL IMPRESSION(S) / ED DIAGNOSES   Final diagnoses:  Acute bilateral low back pain with sciatica, sciatica laterality unspecified     Rx / DC Orders   ED Discharge Orders          Ordered    predniSONE  (DELTASONE ) 50 MG tablet  Daily with breakfast        12/16/23 1442    oxyCODONE -acetaminophen  (PERCOCET) 5-325 MG tablet  Every 6 hours PRN        12/16/23 1442             Note:  This document was prepared using Dragon voice recognition software and may include unintentional dictation errors.   Viviano Ground, MD 12/16/23 1511

## 2023-12-16 NOTE — ED Notes (Signed)
 [facility] called for transport to residence , spoke with Eusebio High

## 2023-12-26 ENCOUNTER — Encounter: Attending: Internal Medicine | Admitting: Internal Medicine

## 2023-12-26 DIAGNOSIS — L97811 Non-pressure chronic ulcer of other part of right lower leg limited to breakdown of skin: Secondary | ICD-10-CM | POA: Insufficient documentation

## 2023-12-26 DIAGNOSIS — Q820 Hereditary lymphedema: Secondary | ICD-10-CM | POA: Insufficient documentation

## 2023-12-26 DIAGNOSIS — L97821 Non-pressure chronic ulcer of other part of left lower leg limited to breakdown of skin: Secondary | ICD-10-CM | POA: Insufficient documentation

## 2024-01-21 ENCOUNTER — Encounter: Admitting: Family

## 2024-01-22 ENCOUNTER — Telehealth: Payer: Self-pay | Admitting: Family

## 2024-01-22 NOTE — Progress Notes (Unsigned)
 Advanced [facility] Failure Clinic Note     PCP: [facility] (last seen 08/24) Primary Cardiologist: Ammon Blunt, MD/ Clarisa Kung, [place] (last seen 10/24)  Chief Complaint: fatigue  HPI:  Ms [name] is a 68 y/o female with a history of asthma, HTN, arthritis, lymphedema, CKD, secondary hyperparathyroidism, sleep apnea, anemia, spinal stenosis, obesity, previous tobacco use and chronic heart failure. Underwent previous cardiac catheterization 1998 which revealed insignificant coronary artery disease.   Echo 04/17/18: EF 65% with PA pressure of 43 mmHg Echo 04/13/19: EF 55-60% with mild LAE and trace MR  Admitted 01/21/2021 at [facility] for right lower extremity cellulitis. Chest x-ray revealed mild CHF.   Admitted 10/31/21 with a/ c heart failure with 20 pound weight gain and worsening SOB. IV diuresed. Echo 11/01/21: EF of 60-65%. Cardiology consulted and meds changed.   Admitted 05/10/22 due to worsening edema and drainage. Patient evaluated and found to have cellulitis of the right lower extremity. Had AKI. Vascular and wound consults obtained. Antibiotics started. Became symptomatic with elevated lactic acid and WBC. IVF given and antibiotic changed. CT scan 11/7 did not show any drainable fluid collection. Discharged after 6 days.   She presents today for a HF follow-up visit with a chief complaint of minimal fatigue. Has associated lymphedema, intermittent dizziness with sudden position changes, bilateral knee pain and rare palpitations along with this. She denies chest pain, abdominal distention or difficulty sleeping. Continues to sleep in recliner due to comfort.  At last visit, her GLP1 was changed to Ozempic  due to insurance coverage. She is having no issues with this and says that her nausea is less with ozempic . Does mention that she tends to miss taking her potassium supplements and bumex  ~ twice / week.    ROS: All systems negative except as listed in HPI, PMH and Problem  List.  SH:  Social History   Socioeconomic History   Marital status: Single    Spouse name: Not on file   Number of children: Not on file   Years of education: Not on file   Highest education level: Not on file  Occupational History   Not on file  Tobacco Use   Smoking status: Former    Current packs/day: 0.00    Average packs/day: 0.3 packs/day for 14.0 years (3.5 ttl pk-yrs)    Types: Cigarettes    Start date: 07/09/1976    Quit date: 07/09/1990    Years since quitting: 33.5   Smokeless tobacco: Current    Types: Snuff  Vaping Use   Vaping status: Never Used  Substance and Sexual Activity   Alcohol use: No   Drug use: No   Sexual activity: Not Currently  Other Topics Concern   Not on file  Social History Narrative   Not on file   Social Drivers of Health   Financial Resource Strain: Not on file  Food Insecurity: No Food Insecurity (09/06/2022)   Received from [facility]   Hunger Vital Sign    Within the past 12 months, you worried that your food would run out before you got the money to buy more.: Never true    Within the past 12 months, the food you bought just didn't last and you didn't have money to get more.: Never true  Transportation Needs: No Transportation Needs (09/06/2022)   Received from [facility]   PRAPARE - Transportation    Lack of Transportation (Medical): No    Lack of Transportation (Non-Medical): No  Physical Activity: Not on  file  Stress: Not on file  Social Connections: Not on file  Intimate Partner Violence: Not At Risk (05/10/2022)   Humiliation, Afraid, Rape, and Kick questionnaire    Fear of Current or Ex-Partner: No    Emotionally Abused: No    Physically Abused: No    Sexually Abused: No    FH:  Family History  Problem Relation Age of Onset   Breast cancer Maternal Aunt    Thyroid disease Other     Past Medical History:  Diagnosis Date   Arthritis    Asthma    CHF (congestive heart failure) (HCC)    Enlarged  heart    Hypertension    Lymphedema    Sleep apnea    Spinal stenosis     Current Outpatient Medications  Medication Sig Dispense Refill   acetaminophen  (TYLENOL ) 500 MG tablet Take 500-1,000 mg by mouth every 6 (six) hours as needed for mild pain or fever.     albuterol  (VENTOLIN  HFA) 108 (90 Base) MCG/ACT inhaler Inhale 1-2 puffs into the lungs every 6 (six) hours as needed for wheezing or shortness of breath. (Patient not taking: Reported on 10/22/2023)     bumetanide  (BUMEX ) 2 MG tablet Take 1 tablet (2 mg total) by mouth daily. Stop Furosemide  30 tablet 4   calcitRIOL (ROCALTROL) 0.25 MCG capsule Take 0.25 mcg by mouth daily.     cetirizine  (ZYRTEC ) 10 MG tablet Take 1 tablet (10 mg total) by mouth daily. (Patient taking differently: Take 10 mg by mouth daily as needed for allergies.) 60 tablet 0   Cyanocobalamin  (VITAMIN B 12) 500 MCG TABS Take 500 mg by mouth daily.     dapagliflozin  propanediol (FARXIGA ) 10 MG TABS tablet Take 1 tablet (10 mg total) by mouth daily before breakfast. 90 tablet 3   KLOR-CON  M20 20 MEQ tablet Take 2 tablets (40 mEq total) by mouth daily. 180 tablet 2   metolazone  (ZAROXOLYN ) 5 MG tablet Take 0.5 tablets (2.5 mg total) by mouth 2 (two) times a week. Tuesdays & Saturdays 24 tablet 3   mupirocin  ointment (BACTROBAN ) 2 % Apply 1 Application topically daily as needed (wound care).     NYSTATIN  powder Apply 1 Application topically 2 (two) times daily as needed (Yeast Skin infection).     Semaglutide , 2 MG/DOSE, (OZEMPIC , 2 MG/DOSE,) 8 MG/3ML SOPN Inject 2 mg into the skin once a week. 3 mL 5   senna-docusate (SENOKOT-S) 8.6-50 MG tablet Take 1 tablet by mouth 2 (two) times daily as needed for mild constipation or moderate constipation.     spironolactone  (ALDACTONE ) 25 MG tablet TAKE 1/2 TABLET BY MOUTH ONCE DAILY 15 tablet 5   spironolactone  (ALDACTONE ) 25 MG tablet TAKE 0.5 TABLET BY MOUTH ONCE DAILY. 45 tablet 3   No current facility-administered medications  for this visit.   There were no vitals filed for this visit.  Wt Readings from Last 3 Encounters:  10/22/23 (!) 319 lb (144.7 kg)  09/17/23 (!) 327 lb 12.8 oz (148.7 kg)  07/30/23 (!) 322 lb 4 oz (146.2 kg)   Lab Results  Component Value Date   CREATININE 1.81 (H) 08/31/2022   CREATININE 1.28 (H) 08/03/2022   CREATININE 1.32 (H) 07/23/2022    PHYSICAL EXAM:  General: Well appearing. No resp difficulty HEENT: normal Neck: supple, no JVD Cor: Regular rhythm, rate. No rubs, gallops or murmurs Lungs: clear Abdomen: soft, nontender, nondistended. Extremities: no cyanosis, clubbing, rash, bilateral lymphedema Neuro: alert & oriented X 3. Moves  all 4 extremities w/o difficulty. Affect pleasant   ECG: not done   ASSESSMENT & PLAN:  1: NICM with preserved ejection fraction- - likely HTN/ untreated sleep apnea - NYHA class II - euvolemic today - weight down 8.8 pounds from last visit here 1 month ago - Echo 04/13/19: EF 55-60% with mild LAE and trace MR - Echo 11/01/21: EF of 60-65%. - reviewed the importance of keeping her sodium 2000mg  as well as keeping daily fluid intake to 60-64 ounces/ day; reviewed not eating saltier foods like the salted meat she had been eating - facial/mouth swelling with ACEi - continue bumex  2mg  every day (misses this ~twice weekly) - continue farxiga  10mg  QD - continue potassium 40meq daily (misses this ~ twice weekly) - continue metolazone  2.5mg  twice weekly - continue spironolactone  12.5mg  daily - pill box provided by pharmD - saw cardiology Melva) 10/24 - BNP 10/31/21 was 123.9  2: HTN with stage 3 CKD- - BP 101/71 - sees PCP Waldon) @ [facility]  - saw nephrology Geoffry) 03/25 - BMP 09/19/23 reviewed: sodium 139, potassium 3.7, creatinine 1.65 and GFR 34  3: Lymphedema- - saw wound center 03/25 - due to take metolazone  today  4: Sleep apnea- - not wearing CPAP nightly and feels like she is sleeping well; encouraged her to f/u  with pulmonology - saw pulmonology Orvil) 04/24  5: Morbid obesity- - BMI today is 56.51 - continue ozempic  2mg  weekly - unable to attend [place] pharmacy clinic due to transportation issues   Return in 3 months, sooner if needed.   Ellouise DELENA Class, FNP 01/22/24

## 2024-01-22 NOTE — Telephone Encounter (Signed)
 Called to confirm/remind patient of their appointment at the [facility] on 01/23/24.   Appointment:   [x] Confirmed  [] Left mess   [] No answer/No voice mail  [] VM Full/unable to leave message  [] Phone not in service  Patient reminded to bring all medications and/or complete list.  Confirmed patient has transportation. Gave directions, instructed to utilize valet parking.

## 2024-01-23 ENCOUNTER — Encounter: Payer: Self-pay | Admitting: Family

## 2024-01-23 ENCOUNTER — Ambulatory Visit: Attending: Family | Admitting: Family

## 2024-01-23 ENCOUNTER — Encounter: Attending: Physician Assistant | Admitting: Physician Assistant

## 2024-01-23 VITALS — BP 104/85 | HR 84 | Wt 312.0 lb

## 2024-01-23 DIAGNOSIS — L97811 Non-pressure chronic ulcer of other part of right lower leg limited to breakdown of skin: Secondary | ICD-10-CM | POA: Insufficient documentation

## 2024-01-23 DIAGNOSIS — Z6841 Body Mass Index (BMI) 40.0 and over, adult: Secondary | ICD-10-CM | POA: Insufficient documentation

## 2024-01-23 DIAGNOSIS — Z7984 Long term (current) use of oral hypoglycemic drugs: Secondary | ICD-10-CM | POA: Insufficient documentation

## 2024-01-23 DIAGNOSIS — I10 Essential (primary) hypertension: Secondary | ICD-10-CM | POA: Diagnosis not present

## 2024-01-23 DIAGNOSIS — G4733 Obstructive sleep apnea (adult) (pediatric): Secondary | ICD-10-CM

## 2024-01-23 DIAGNOSIS — G4730 Sleep apnea, unspecified: Secondary | ICD-10-CM | POA: Diagnosis not present

## 2024-01-23 DIAGNOSIS — M1990 Unspecified osteoarthritis, unspecified site: Secondary | ICD-10-CM | POA: Insufficient documentation

## 2024-01-23 DIAGNOSIS — I5032 Chronic diastolic (congestive) heart failure: Secondary | ICD-10-CM | POA: Diagnosis not present

## 2024-01-23 DIAGNOSIS — Z79899 Other long term (current) drug therapy: Secondary | ICD-10-CM | POA: Diagnosis not present

## 2024-01-23 DIAGNOSIS — Q820 Hereditary lymphedema: Secondary | ICD-10-CM | POA: Diagnosis not present

## 2024-01-23 DIAGNOSIS — J45909 Unspecified asthma, uncomplicated: Secondary | ICD-10-CM | POA: Insufficient documentation

## 2024-01-23 DIAGNOSIS — N183 Chronic kidney disease, stage 3 unspecified: Secondary | ICD-10-CM | POA: Diagnosis not present

## 2024-01-23 DIAGNOSIS — I890 Lymphedema, not elsewhere classified: Secondary | ICD-10-CM | POA: Insufficient documentation

## 2024-01-23 DIAGNOSIS — I130 Hypertensive heart and chronic kidney disease with heart failure and stage 1 through stage 4 chronic kidney disease, or unspecified chronic kidney disease: Secondary | ICD-10-CM | POA: Insufficient documentation

## 2024-01-23 DIAGNOSIS — L97821 Non-pressure chronic ulcer of other part of left lower leg limited to breakdown of skin: Secondary | ICD-10-CM | POA: Diagnosis not present

## 2024-01-23 DIAGNOSIS — N2581 Secondary hyperparathyroidism of renal origin: Secondary | ICD-10-CM | POA: Diagnosis not present

## 2024-01-23 DIAGNOSIS — Z87891 Personal history of nicotine dependence: Secondary | ICD-10-CM | POA: Insufficient documentation

## 2024-01-23 DIAGNOSIS — I428 Other cardiomyopathies: Secondary | ICD-10-CM | POA: Diagnosis not present

## 2024-01-23 NOTE — Patient Instructions (Signed)
 Medication Changes:  No medication changes.   Testing/Procedures:  Please have your echo completed. You will check in for this at the [facility]. You have to arrive 15 MINS EARLY for preparation, otherwise you will have to reschedule.   Special Instructions // Education:  It was good to see you today, keep up the good work!   Follow-Up in: 3 months after echo.  At the [facility], you and your health needs are our priority. We have a designated team specialized in the treatment of Heart Failure. This Care Team includes your primary Heart Failure Specialized Cardiologist (physician), Advanced Practice Providers (APPs- Physician Assistants and Nurse Practitioners), and Pharmacist who all work together to provide you with the care you need, when you need it.   You may see any of the following providers on your designated Care Team at your next follow up:  Dr. Toribio Fuel Dr. Ezra Shuck Dr. Ria Commander Dr. Odis Brownie Ellouise Class, FNP Jaun Bash, RPH-CPP  Please be sure to bring in all your medications bottles to every appointment.   Need to Contact Us :  If you have any questions or concerns before your next appointment please send us  a message through [place] or call our office at [phone].    TO LEAVE A MESSAGE FOR THE NURSE SELECT OPTION 2, PLEASE LEAVE A MESSAGE INCLUDING: YOUR NAME DATE OF BIRTH CALL BACK NUMBER REASON FOR CALL**this is important as we prioritize the call backs  YOU WILL RECEIVE A CALL BACK THE SAME DAY AS LONG AS YOU CALL BEFORE 4:00 PM

## 2024-02-20 ENCOUNTER — Ambulatory Visit: Admitting: Physician Assistant

## 2024-02-27 ENCOUNTER — Encounter: Attending: Physician Assistant | Admitting: Physician Assistant

## 2024-02-27 DIAGNOSIS — Q820 Hereditary lymphedema: Secondary | ICD-10-CM | POA: Diagnosis present

## 2024-02-27 DIAGNOSIS — L97811 Non-pressure chronic ulcer of other part of right lower leg limited to breakdown of skin: Secondary | ICD-10-CM | POA: Diagnosis not present

## 2024-02-27 DIAGNOSIS — L97821 Non-pressure chronic ulcer of other part of left lower leg limited to breakdown of skin: Secondary | ICD-10-CM | POA: Insufficient documentation

## 2024-03-26 ENCOUNTER — Encounter: Attending: Physician Assistant | Admitting: Physician Assistant

## 2024-03-26 ENCOUNTER — Ambulatory Visit: Payer: Self-pay | Admitting: Family

## 2024-03-26 ENCOUNTER — Ambulatory Visit
Admission: RE | Admit: 2024-03-26 | Discharge: 2024-03-26 | Disposition: A | Discharge status: Home / Self Care | Source: Ambulatory Visit | Attending: Family | Admitting: Family

## 2024-03-26 DIAGNOSIS — Q820 Hereditary lymphedema: Secondary | ICD-10-CM | POA: Insufficient documentation

## 2024-03-26 DIAGNOSIS — L97811 Non-pressure chronic ulcer of other part of right lower leg limited to breakdown of skin: Secondary | ICD-10-CM | POA: Insufficient documentation

## 2024-03-26 DIAGNOSIS — I5032 Chronic diastolic (congestive) heart failure: Secondary | ICD-10-CM | POA: Insufficient documentation

## 2024-03-26 DIAGNOSIS — L97821 Non-pressure chronic ulcer of other part of left lower leg limited to breakdown of skin: Secondary | ICD-10-CM | POA: Insufficient documentation

## 2024-03-26 DIAGNOSIS — G4730 Sleep apnea, unspecified: Secondary | ICD-10-CM | POA: Insufficient documentation

## 2024-03-26 DIAGNOSIS — I110 Hypertensive heart disease with heart failure: Secondary | ICD-10-CM | POA: Diagnosis not present

## 2024-03-26 LAB — ECHOCARDIOGRAM COMPLETE
AR max vel: 1.95 cm2
AV Area VTI: 2 cm2
AV Area mean vel: 1.74 cm2
AV Mean grad: 5.7 mmHg
AV Peak grad: 10 mmHg
Ao pk vel: 1.58 m/s
Area-P 1/2: 2.87 cm2
MV VTI: 2.07 cm2
S' Lateral: 2.7 cm

## 2024-03-26 NOTE — Progress Notes (Signed)
*  PRELIMINARY RESULTS* Echocardiogram 2D Echocardiogram has been performed.  Floydene Harder 03/26/2024, 11:32 AM

## 2024-04-03 ENCOUNTER — Telehealth: Payer: Self-pay | Admitting: Family

## 2024-04-03 MED ORDER — OZEMPIC (2 MG/DOSE) 8 MG/3ML ~~LOC~~ SOPN: 2.0000 mg | PEN_INJECTOR | SUBCUTANEOUS | 5 refills | Status: DC

## 2024-04-03 NOTE — Progress Notes (Signed)
 Medication sent to pharmacy

## 2024-04-08 ENCOUNTER — Other Ambulatory Visit: Payer: Self-pay

## 2024-04-08 ENCOUNTER — Emergency Department
Admission: EM | Admit: 2024-04-08 | Discharge: 2024-04-08 | Disposition: A | Discharge status: Home / Self Care | Attending: Emergency Medicine | Admitting: Emergency Medicine

## 2024-04-08 ENCOUNTER — Encounter: Payer: Self-pay | Admitting: Intensive Care

## 2024-04-08 ENCOUNTER — Emergency Department

## 2024-04-08 DIAGNOSIS — I509 Heart failure, unspecified: Secondary | ICD-10-CM | POA: Insufficient documentation

## 2024-04-08 DIAGNOSIS — M79605 Pain in left leg: Secondary | ICD-10-CM | POA: Diagnosis present

## 2024-04-08 DIAGNOSIS — I890 Lymphedema, not elsewhere classified: Secondary | ICD-10-CM | POA: Diagnosis not present

## 2024-04-08 DIAGNOSIS — I110 Hypertensive heart disease with heart failure: Secondary | ICD-10-CM | POA: Insufficient documentation

## 2024-04-08 LAB — COMPREHENSIVE METABOLIC PANEL WITH GFR
ALT: 11 U/L (ref 0–44)
AST: 17 U/L (ref 15–41)
Albumin: 3.6 g/dL (ref 3.5–5.0)
Alkaline Phosphatase: 107 U/L (ref 38–126)
Anion gap: 12 (ref 5–15)
BUN: 28 mg/dL — ABNORMAL HIGH (ref 8–23)
CO2: 20 mmol/L — ABNORMAL LOW (ref 22–32)
Calcium: 8.8 mg/dL — ABNORMAL LOW (ref 8.9–10.3)
Chloride: 104 mmol/L (ref 98–111)
Creatinine, Ser: 1.71 mg/dL — ABNORMAL HIGH (ref 0.44–1.00)
GFR, Estimated: 32 mL/min — ABNORMAL LOW (ref >60)
Glucose, Bld: 90 mg/dL (ref 70–99)
Potassium: 4.1 mmol/L (ref 3.5–5.1)
Sodium: 136 mmol/L (ref 135–145)
Total Bilirubin: 1.2 mg/dL (ref 0.0–1.2)
Total Protein: 8.1 g/dL (ref 6.5–8.1)

## 2024-04-08 LAB — CBC
HCT: 36.2 % (ref 36.0–46.0)
Hemoglobin: 11.3 g/dL — ABNORMAL LOW (ref 12.0–15.0)
MCH: 26.7 pg (ref 26.0–34.0)
MCHC: 31.2 g/dL (ref 30.0–36.0)
MCV: 85.6 fL (ref 80.0–100.0)
Platelets: 165 K/uL (ref 150–400)
RBC: 4.23 MIL/uL (ref 3.87–5.11)
RDW: 13.3 % (ref 11.5–15.5)
WBC: 9.3 K/uL (ref 4.0–10.5)
nRBC: 0 % (ref 0.0–0.2)

## 2024-04-08 MED ORDER — OXYCODONE-ACETAMINOPHEN 5-325 MG PO TABS
1.0000 | ORAL_TABLET | Freq: Once | ORAL | Status: AC
  Administered 2024-04-08: 1 via ORAL
  Filled 2024-04-08: qty 1

## 2024-04-08 MED ORDER — OXYCODONE-ACETAMINOPHEN 5-325 MG PO TABS: 1.0000 | ORAL_TABLET | ORAL | 0 refills | Status: AC | PRN

## 2024-04-08 MED ORDER — ONDANSETRON 4 MG PO TBDP
4.0000 mg | ORAL_TABLET | Freq: Once | ORAL | Status: AC
  Administered 2024-04-08: 4 mg via ORAL
  Filled 2024-04-08: qty 1

## 2024-04-08 NOTE — ED Provider Notes (Signed)
 [facility] Provider Note    Event Date/Time   First MD Initiated Contact with Patient 04/08/24 1134     (approximate)  History   Chief Complaint: Foot Pain  HPI  [name] is a 68 y.o. female with a past medical history of CHF, arthritis, hypertension, chronic lymphedema, presents to the emergency department for left leg pain.  According to the patient over the last week or so she has had progressive worsening left leg pain.  Patient denies any trauma.  Patient states normally she is able to ambulate with use of 2 canes.  Patient denies any fever.  States pain in the left foot up to the left thigh.  Patient sees wound care for her chronic lymphedema but no active wounds per patient.  Just saw them 1 week ago.  Physical Exam   Triage Vital Signs: ED Triage Vitals  Encounter Vitals Group     BP 04/08/24 1102 138/75     Girls Systolic BP Percentile --      Girls Diastolic BP Percentile --      Boys Systolic BP Percentile --      Boys Diastolic BP Percentile --      Pulse Rate 04/08/24 1100 90     Resp 04/08/24 1100 18     Temp 04/08/24 1100 98.1 F (36.7 C)     Temp Source 04/08/24 1100 Oral     SpO2 04/08/24 1100 100 %     Weight 04/08/24 1101 (!) 312 lb (141.5 kg)     Height 04/08/24 1101 5' 3.5 (1.613 m)     Head Circumference --      Peak Flow --      Pain Score 04/08/24 1100 10     Pain Loc --      Pain Education --      Exclude from Growth Chart --     Most recent vital signs: Vitals:   04/08/24 1100 04/08/24 1102  BP:  138/75  Pulse: 90   Resp: 18   Temp: 98.1 F (36.7 C)   SpO2: 100%     General: Awake, no distress.  CV:  Good peripheral perfusion.  Regular rate and rhythm  Resp:  Normal effort.  Equal breath sounds bilaterally.  Abd:  No distention.  Soft, nontender.  Other:  Lower extremity chronic lymphedema with chronic skin changes.  No erythema to suggest active infection no obvious skin ulcerations or  wounds.   ED Results / Procedures / Treatments   RADIOLOGY  Ultrasound negative for DVT   MEDICATIONS ORDERED IN ED: Medications  oxyCODONE -acetaminophen  (PERCOCET/ROXICET) 5-325 MG per tablet 1 tablet (has no administration in time range)  ondansetron  (ZOFRAN -ODT) disintegrating tablet 4 mg (has no administration in time range)     IMPRESSION / MDM / ASSESSMENT AND PLAN / ED COURSE  I reviewed the triage vital signs and the nursing notes.  Patient's presentation is most consistent with acute presentation with potential threat to life or bodily function.  Patient presents emergency department for left leg pain.  Has a history of chronic lymphedema.  Patient normally takes Tylenol  but states she ran out.  Patient has no obvious erythema to suggest infection.  Chronic lymphedema with chronic skin changes.  Will obtain an ultrasound given worsening pain with no trauma to ensure no blood clot.  We will check labs we will treat pain and continue to closely monitor.  Patient agreeable to plan of care.  Patient's workup today  shows a reassuring chemistry that and largely unchanged from 1 year ago.  Patient CBC is normal.  Ultrasound reassuringly negative for DVT.  Given the patient's reassuring workup I believe the patient will be safe for discharge home with a short course of pain medication and have her follow-up with her doctor.  Patient is agreeable to plan of care.  Pain was improved after taking Percocet in the emergency department.  FINAL CLINICAL IMPRESSION(S) / ED DIAGNOSES   Left leg pain Chronic lymphedema    Note:  This document was prepared using Dragon voice recognition software and may include unintentional dictation errors.   Dorothyann Drivers, MD 04/08/24 1447

## 2024-04-08 NOTE — ED Notes (Signed)
[facility] called to transport pt home.

## 2024-04-08 NOTE — Discharge Instructions (Addendum)
 Please take your pain medication as needed but only as prescribed.  You may use over-the-counter Tylenol  during the day as needed for discomfort.  Please note that your prescribed pain medication also contains Tylenol /acetaminophen .  Please limit your daily total Tylenol /acetaminophen  to 4000 mg(4g) in any 24-hour period. Please follow-up with your doctor regarding today's ER visit and your left leg pain.

## 2024-04-08 NOTE — ED Triage Notes (Signed)
 Arrived by [facility] from home. Left leg and left foot pain. Reports pain has been progressively getting worse over the week and now unable to put weight on left leg/foot.   Denies fall. A&O x4 upon arrival  Needed two person assist when getting on EMS stretcher.  EMS vitals: 140/83 b/p 80sHR

## 2024-04-13 ENCOUNTER — Telehealth: Payer: Self-pay | Admitting: Family

## 2024-04-13 NOTE — Telephone Encounter (Signed)
 Called to confirm/remind patient of their appointment at the [facility] on 04/14/24.   Appointment:   [x] Confirmed  [] Left mess   [] No answer/No voice mail  [] VM Full/unable to leave message  [] Phone not in service  Patient reminded to bring all medications and/or complete list.  Confirmed patient has transportation. Gave directions, instructed to utilize valet parking.

## 2024-04-14 ENCOUNTER — Encounter: Payer: Self-pay | Admitting: Family

## 2024-04-14 ENCOUNTER — Ambulatory Visit: Attending: Family | Admitting: Family

## 2024-04-14 VITALS — BP 120/80 | HR 85 | Wt 304.0 lb

## 2024-04-14 DIAGNOSIS — I890 Lymphedema, not elsewhere classified: Secondary | ICD-10-CM | POA: Diagnosis not present

## 2024-04-14 DIAGNOSIS — I130 Hypertensive heart and chronic kidney disease with heart failure and stage 1 through stage 4 chronic kidney disease, or unspecified chronic kidney disease: Secondary | ICD-10-CM | POA: Insufficient documentation

## 2024-04-14 DIAGNOSIS — Z79899 Other long term (current) drug therapy: Secondary | ICD-10-CM | POA: Insufficient documentation

## 2024-04-14 DIAGNOSIS — N2581 Secondary hyperparathyroidism of renal origin: Secondary | ICD-10-CM | POA: Diagnosis not present

## 2024-04-14 DIAGNOSIS — I5032 Chronic diastolic (congestive) heart failure: Secondary | ICD-10-CM | POA: Insufficient documentation

## 2024-04-14 DIAGNOSIS — Z87891 Personal history of nicotine dependence: Secondary | ICD-10-CM | POA: Diagnosis not present

## 2024-04-14 DIAGNOSIS — J45909 Unspecified asthma, uncomplicated: Secondary | ICD-10-CM | POA: Insufficient documentation

## 2024-04-14 DIAGNOSIS — M79672 Pain in left foot: Secondary | ICD-10-CM | POA: Insufficient documentation

## 2024-04-14 DIAGNOSIS — I10 Essential (primary) hypertension: Secondary | ICD-10-CM | POA: Diagnosis not present

## 2024-04-14 DIAGNOSIS — K5900 Constipation, unspecified: Secondary | ICD-10-CM | POA: Diagnosis not present

## 2024-04-14 DIAGNOSIS — M545 Low back pain, unspecified: Secondary | ICD-10-CM | POA: Insufficient documentation

## 2024-04-14 DIAGNOSIS — N183 Chronic kidney disease, stage 3 unspecified: Secondary | ICD-10-CM | POA: Insufficient documentation

## 2024-04-14 DIAGNOSIS — R0602 Shortness of breath: Secondary | ICD-10-CM | POA: Diagnosis present

## 2024-04-14 DIAGNOSIS — I428 Other cardiomyopathies: Secondary | ICD-10-CM | POA: Diagnosis not present

## 2024-04-14 DIAGNOSIS — G4733 Obstructive sleep apnea (adult) (pediatric): Secondary | ICD-10-CM

## 2024-04-14 DIAGNOSIS — Z7984 Long term (current) use of oral hypoglycemic drugs: Secondary | ICD-10-CM | POA: Insufficient documentation

## 2024-04-14 DIAGNOSIS — G4730 Sleep apnea, unspecified: Secondary | ICD-10-CM | POA: Diagnosis not present

## 2024-04-14 NOTE — Progress Notes (Signed)
 Advanced Heart Failure Clinic Note     PCP: [facility] (last seen 06/25) Primary Cardiologist: Florencio Kava, MD / Clarisa Kung, PA (last seen 04/25; returns 10/25)  Chief Complaint: shortness of breath   HPI:  [name] is a 68 y/o female with a history of asthma, HTN, arthritis, lymphedema, CKD, secondary hyperparathyroidism, sleep apnea, anemia, spinal stenosis, obesity, previous tobacco use and chronic heart failure. Underwent previous cardiac catheterization 1998 which revealed insignificant coronary artery disease.   Echo 04/17/18: EF 65% with PA pressure of 43 mmHg Echo 04/13/19: EF 55-60% with mild LAE and trace MR  Admitted 01/21/2021 at [facility] for right lower extremity cellulitis. Chest x-ray revealed mild CHF.   Admitted 10/31/21 with a/ c heart failure with 20 pound weight gain and worsening SOB. IV diuresed. Echo 11/01/21: EF of 60-65%. Cardiology consulted and meds changed.   Admitted 05/10/22 due to worsening edema and drainage. Patient evaluated and found to have cellulitis of the right lower extremity. Had AKI. Vascular and wound consults obtained. Antibiotics started. Became symptomatic with elevated lactic acid and WBC. IVF given and antibiotic changed. CT scan 11/7 did not show any drainable fluid collection. Discharged after 6 days.   Was in the ED 12/16/23 with back pain with radiation to bilateral hips. Evaluated and released with oral prednisone .   Echo 03/26/24: EF 60-65%, LV and RV normal  ED visit 04/08/24 for left sided foot pain. Ultrasound negative for DVT. Patient discharged with percocet as needed.   She presents today for a HF follow-up visit with a chief complaint of minimal shortness of breath (improved) with associated occasional dizziness and lymphedema. She denies chest pain, palpitations, cough, abdominal distention, or worsening edema. She endorses positive sleep but sleeps in a recliner due to orthopnea. She does not use a CPAP and denies PND. For  CPAP, reports cost of equipment as a concern prior to insurance. She was offered a rental in which she refused.   Endorses lower back pain and left sided foot pain. She continues to use percocet once a day and Tylenol  as needed with some relief. She has noted weight loss since beginning ozempic .   Reports mild constipation since starting iron which has decreased appetite. Has started taking prunes and stool softeners to help with symptoms. She is adding a little salt to meals, but not every meal. Drinks 3- 18oz cups of primarily sweet tea with some water intake.   ROS: All systems negative except as listed in HPI, PMH and Problem List.  SH:  Social History   Socioeconomic History   Marital status: Single    Spouse name: Not on file   Number of children: Not on file   Years of education: Not on file   Highest education level: Not on file  Occupational History   Not on file  Tobacco Use   Smoking status: Former    Current packs/day: 0.00    Average packs/day: 0.3 packs/day for 14.0 years (3.5 ttl pk-yrs)    Types: Cigarettes    Start date: 07/09/1976    Quit date: 07/09/1990    Years since quitting: 33.7   Smokeless tobacco: Current    Types: Snuff  Vaping Use   Vaping status: Never Used  Substance and Sexual Activity   Alcohol use: No   Drug use: No   Sexual activity: Not Currently  Other Topics Concern   Not on file  Social History Narrative   Not on file   Social Drivers of Health  Financial Resource Strain: Not on file  Food Insecurity: No Food Insecurity (09/06/2022)   Received from [facility]   Hunger Vital Sign    Within the past 12 months, you worried that your food would run out before you got the money to buy more.: Never true    Within the past 12 months, the food you bought just didn't last and you didn't have money to get more.: Never true  Transportation Needs: No Transportation Needs (09/06/2022)   Received from [facility]   PRAPARE -  Transportation    Lack of Transportation (Medical): No    Lack of Transportation (Non-Medical): No  Physical Activity: Not on file  Stress: Not on file  Social Connections: Not on file  Intimate Partner Violence: Not At Risk (05/10/2022)   Humiliation, Afraid, Rape, and Kick questionnaire    Fear of Current or Ex-Partner: No    Emotionally Abused: No    Physically Abused: No    Sexually Abused: No    FH:  Family History  Problem Relation Age of Onset   Breast cancer Maternal Aunt    Thyroid disease Other     Past Medical History:  Diagnosis Date   Arthritis    Asthma    CHF (congestive heart failure) (HCC)    Enlarged heart    Hypertension    Lymphedema    Sleep apnea    Spinal stenosis     Current Outpatient Medications  Medication Sig Dispense Refill   acetaminophen  (TYLENOL ) 500 MG tablet Take 500-1,000 mg by mouth every 6 (six) hours as needed for mild pain or fever.     albuterol  (VENTOLIN  HFA) 108 (90 Base) MCG/ACT inhaler Inhale 1-2 puffs into the lungs every 6 (six) hours as needed for wheezing or shortness of breath.     bumetanide  (BUMEX ) 2 MG tablet Take 1 tablet (2 mg total) by mouth daily. Stop Furosemide  30 tablet 4   calcitRIOL (ROCALTROL) 0.25 MCG capsule Take 0.25 mcg by mouth daily.     cetirizine  (ZYRTEC ) 10 MG tablet Take 1 tablet (10 mg total) by mouth daily. (Patient taking differently: Take 10 mg by mouth daily as needed for allergies.) 60 tablet 0   Cyanocobalamin  (VITAMIN B 12) 500 MCG TABS Take 500 mg by mouth daily.     dapagliflozin  propanediol (FARXIGA ) 10 MG TABS tablet Take 1 tablet (10 mg total) by mouth daily before breakfast. 90 tablet 3   KLOR-CON  M20 20 MEQ tablet Take 2 tablets (40 mEq total) by mouth daily. 180 tablet 2   metolazone  (ZAROXOLYN ) 5 MG tablet Take 0.5 tablets (2.5 mg total) by mouth 2 (two) times a week. Tuesdays & Saturdays 24 tablet 3   mupirocin  ointment (BACTROBAN ) 2 % Apply 1 Application topically daily as needed  (wound care).     NYSTATIN  powder Apply 1 Application topically 2 (two) times daily as needed (Yeast Skin infection).     oxyCODONE -acetaminophen  (PERCOCET) 5-325 MG tablet Take 1 tablet by mouth every 4 (four) hours as needed for severe pain (pain score 7-10). 12 tablet 0   Semaglutide , 2 MG/DOSE, (OZEMPIC , 2 MG/DOSE,) 8 MG/3ML SOPN Inject 2 mg into the skin once a week. 3 mL 5   senna-docusate (SENOKOT-S) 8.6-50 MG tablet Take 1 tablet by mouth 2 (two) times daily as needed for mild constipation or moderate constipation.     spironolactone  (ALDACTONE ) 25 MG tablet TAKE 1/2 TABLET BY MOUTH ONCE DAILY 15 tablet 5   spironolactone  (ALDACTONE ) 25 MG  tablet TAKE 0.5 TABLET BY MOUTH ONCE DAILY. 45 tablet 3   No current facility-administered medications for this visit.   There were no vitals filed for this visit.   Vitals:   04/14/24 1021  BP: 120/80  Pulse: 85  SpO2: 96%  Weight: (!) 137.9 kg   Wt Readings from Last 3 Encounters:  04/14/24 (!) 137.9 kg  04/08/24 (!) 141.5 kg  01/23/24 (!) 141.5 kg    Lab Results  Component Value Date   CREATININE 1.71 (H) 04/08/2024   CREATININE 1.81 (H) 08/31/2022   CREATININE 1.28 (H) 08/03/2022    PHYSICAL EXAM:  General: Well appearing, obese female. Walks w/walker. No acute signs of distress.  Cor: Regular rhythm, rate. No JVD.  Lungs: Clear bilaterally. Symmetrical chest expansion. Abdomen: Soft, nontender, nondistended. Extremities: + lymphedema bilaterally.  Neuro:. AO x 4. Affect pleasant.   ECG: not done   ASSESSMENT & PLAN:  1: NICM with preserved ejection fraction- - likely HTN/ untreated sleep apnea - NYHA class II - euvolemic today - weight down 8 pounds from last visit here 3 months ago - Echo 04/13/19: EF 55-60% with mild LAE and trace MR - Echo 11/01/21: EF of 60-65% - Echo 03/26/24: EF 60-65%, LV and RV normal - Echo results discussed with patient understanding - reviewed the importance of keeping her sodium 2000mg    - reviewed daily fluid intake to 60-64 ounces/ day with reduction in sugar intake - facial/mouth swelling with ACEi - continue bumex  2mg  daily - continue farxiga  10mg  QD - continue potassium 40meq daily  - continue metolazone  2.5mg  twice weekly - continue spironolactone  12.5mg  daily - saw cardiology [name]) 04/25; returns 10/25 - BNP 10/31/21 was 123.9  2: HTN with stage 3 CKD- - BP 120/80 - sees PCP [name]) @ [facility]  - saw nephrology [name]) 07/25; returns 11/25 - BMP 04/08/24 reviewed: sodium 136, potassium 4.1, creatinine 1.71 and GFR 32  3: Lymphedema- - goes to the wound center monthly - upcoming appt 04/23/24  4: Sleep apnea- - no CPAP use with no sleep interruptions - encouraged her to f/u with pulmonology to reassess obtaining equipment  - saw pulmonology [name]) 04/24  5: Morbid obesity- - BMI today is 53.01 kg/ m2 - continue ozempic  2mg  weekly - she continues to try and increase her activity   Return in 6 months, sooner if needed.  [name] Class ,FNP/Elizabeth Fitzner, FNP-S 04/14/24

## 2024-04-23 ENCOUNTER — Encounter: Attending: Physician Assistant | Admitting: Physician Assistant

## 2024-04-23 DIAGNOSIS — L97821 Non-pressure chronic ulcer of other part of left lower leg limited to breakdown of skin: Secondary | ICD-10-CM | POA: Insufficient documentation

## 2024-04-23 DIAGNOSIS — L97811 Non-pressure chronic ulcer of other part of right lower leg limited to breakdown of skin: Secondary | ICD-10-CM | POA: Diagnosis not present

## 2024-04-23 DIAGNOSIS — Q820 Hereditary lymphedema: Secondary | ICD-10-CM | POA: Diagnosis present

## 2024-05-21 ENCOUNTER — Encounter: Attending: Physician Assistant | Admitting: Physician Assistant

## 2024-06-11 ENCOUNTER — Encounter: Admitting: Physician Assistant

## 2024-07-13 ENCOUNTER — Encounter: Attending: Physician Assistant | Admitting: Physician Assistant

## 2024-07-13 DIAGNOSIS — L97821 Non-pressure chronic ulcer of other part of left lower leg limited to breakdown of skin: Secondary | ICD-10-CM | POA: Insufficient documentation

## 2024-07-13 DIAGNOSIS — Q820 Hereditary lymphedema: Secondary | ICD-10-CM | POA: Insufficient documentation

## 2024-07-13 DIAGNOSIS — L97811 Non-pressure chronic ulcer of other part of right lower leg limited to breakdown of skin: Secondary | ICD-10-CM | POA: Insufficient documentation

## 2024-08-05 ENCOUNTER — Other Ambulatory Visit: Payer: Self-pay | Admitting: Family

## 2024-08-07 ENCOUNTER — Other Ambulatory Visit: Payer: Self-pay | Admitting: Family

## 2024-08-10 ENCOUNTER — Encounter: Admitting: Physician Assistant

## 2024-08-17 ENCOUNTER — Encounter: Admitting: Physician Assistant

## 2024-08-20 ENCOUNTER — Encounter: Admitting: Physician Assistant

## 2024-10-13 ENCOUNTER — Encounter: Admitting: Family
